# Patient Record
Sex: Male | Born: 1965
Health system: Southern US, Community
[De-identification: ages and names within clinical notes are randomized; demographics above are authoritative.]

## PROBLEM LIST (undated history)

## (undated) DIAGNOSIS — F32A Depression, unspecified: Secondary | ICD-10-CM

## (undated) DIAGNOSIS — T8859XA Other complications of anesthesia, initial encounter: Secondary | ICD-10-CM

## (undated) DIAGNOSIS — M869 Osteomyelitis, unspecified: Secondary | ICD-10-CM

## (undated) DIAGNOSIS — I10 Essential (primary) hypertension: Secondary | ICD-10-CM

## (undated) DIAGNOSIS — F4310 Post-traumatic stress disorder, unspecified: Secondary | ICD-10-CM

## (undated) DIAGNOSIS — I5032 Chronic diastolic (congestive) heart failure: Secondary | ICD-10-CM

## (undated) DIAGNOSIS — I519 Heart disease, unspecified: Secondary | ICD-10-CM

## (undated) DIAGNOSIS — I509 Heart failure, unspecified: Secondary | ICD-10-CM

## (undated) DIAGNOSIS — C801 Malignant (primary) neoplasm, unspecified: Secondary | ICD-10-CM

## (undated) DIAGNOSIS — E119 Type 2 diabetes mellitus without complications: Secondary | ICD-10-CM

## (undated) DIAGNOSIS — Z87442 Personal history of urinary calculi: Secondary | ICD-10-CM

## (undated) HISTORY — PX: BELOW KNEE LEG AMPUTATION: SUR23

## (undated) HISTORY — DX: Heart disease, unspecified: I51.9

---

## 2019-09-13 ENCOUNTER — Encounter (HOSPITAL_COMMUNITY): Payer: Self-pay

## 2019-09-13 ENCOUNTER — Ambulatory Visit (HOSPITAL_COMMUNITY)
Admission: EM | Admit: 2019-09-13 | Discharge: 2019-09-13 | Disposition: A | Discharge status: Home / Self Care | Payer: Medicare HMO | Attending: Urgent Care | Admitting: Urgent Care

## 2019-09-13 ENCOUNTER — Other Ambulatory Visit: Payer: Self-pay

## 2019-09-13 DIAGNOSIS — R3129 Other microscopic hematuria: Secondary | ICD-10-CM | POA: Diagnosis not present

## 2019-09-13 HISTORY — DX: Type 2 diabetes mellitus without complications: E11.9

## 2019-09-13 HISTORY — DX: Essential (primary) hypertension: I10

## 2019-09-13 LAB — COMPREHENSIVE METABOLIC PANEL
ALT: 54 U/L — ABNORMAL HIGH (ref 0–44)
AST: 37 U/L (ref 15–41)
Albumin: 3.4 g/dL — ABNORMAL LOW (ref 3.5–5.0)
Alkaline Phosphatase: 76 U/L (ref 38–126)
Anion gap: 11 (ref 5–15)
BUN: 21 mg/dL — ABNORMAL HIGH (ref 6–20)
CO2: 24 mmol/L (ref 22–32)
Calcium: 9 mg/dL (ref 8.9–10.3)
Chloride: 100 mmol/L (ref 98–111)
Creatinine, Ser: 1.57 mg/dL — ABNORMAL HIGH (ref 0.61–1.24)
GFR calc Af Amer: 57 mL/min — ABNORMAL LOW (ref >60)
GFR calc non Af Amer: 50 mL/min — ABNORMAL LOW (ref >60)
Glucose, Bld: 372 mg/dL — ABNORMAL HIGH (ref 70–99)
Potassium: 4.5 mmol/L (ref 3.5–5.1)
Sodium: 135 mmol/L (ref 135–145)
Total Bilirubin: 0.8 mg/dL (ref 0.3–1.2)
Total Protein: 6.6 g/dL (ref 6.5–8.1)

## 2019-09-13 LAB — CBC
HCT: 52.4 % — ABNORMAL HIGH (ref 39.0–52.0)
Hemoglobin: 17.8 g/dL — ABNORMAL HIGH (ref 13.0–17.0)
MCH: 29.2 pg (ref 26.0–34.0)
MCHC: 34 g/dL (ref 30.0–36.0)
MCV: 85.9 fL (ref 80.0–100.0)
Platelets: 244 10*3/uL (ref 150–400)
RBC: 6.1 MIL/uL — ABNORMAL HIGH (ref 4.22–5.81)
RDW: 13 % (ref 11.5–15.5)
WBC: 8.2 10*3/uL (ref 4.0–10.5)
nRBC: 0 % (ref 0.0–0.2)

## 2019-09-13 LAB — POCT URINALYSIS DIP (DEVICE)
Bilirubin Urine: NEGATIVE
Glucose, UA: 500 mg/dL — AB
Ketones, ur: NEGATIVE mg/dL
Leukocytes,Ua: NEGATIVE
Nitrite: NEGATIVE
Protein, ur: 100 mg/dL — AB
Specific Gravity, Urine: 1.025 (ref 1.005–1.030)
Urobilinogen, UA: 0.2 mg/dL (ref 0.0–1.0)
pH: 7 (ref 5.0–8.0)

## 2019-09-13 NOTE — ED Triage Notes (Signed)
Pt states he has kidney issues. Pt states he has blood in has urine. X 2 weeks or more. Pt states that doxy, cephalosporins, and penicillin Pre his PCP back home.

## 2019-09-13 NOTE — Discharge Instructions (Signed)
You have microscopic blood to urine today but no indication of infection at this time.  We will obtain some basic labs here today in anticipation of your establishment with a PCP as you will need further evaluation and management/ referral etc.  Continue to take your diabetes and blood pressure medications to control these chronic issues.

## 2019-09-13 NOTE — ED Provider Notes (Signed)
[place]    CSN: [id number] Arrival date & time: 09/13/19  0818      History   Chief Complaint Chief Complaint  Patient presents with  . Hematuria    HPI [name] is a 54 y.o. male.   [name] presents with complaints of chronic pelvic and back pain as well as hematuria. Moved here from [place] in November and is still working to establish care with a PCP. Had an MRI of kidneys completed in October of 2020 which presented with cystic lesions, with recommendations for recheck in 6 months. Symptoms have unchanged. He doesn't smoke. States his father had kidney cancer. No pain or frequency with urination. Has been treated with doxycycline in October for this, didn't help. No other penile complaints. He has type two diabetes, states his last a1c was 7.2 in October of 2020. He is taking metformin and humalog to control his diabetes. History of right lower leg amputation. States he has had pelvic and back pain for three years. No specific changes here today. Is working with his [employer] to set up appointment with PCP.     ROS per HPI, negative if not otherwise mentioned.      Past Medical History:  Diagnosis Date  . Diabetes mellitus without complication ([place])   . Hypertension     There are no problems to display for this patient.   History reviewed. No pertinent surgical history.     Home Medications    Prior to Admission medications   Not on File    Family History History reviewed. No pertinent family history.  Social History Social History   Tobacco Use  . Smoking status: Never Smoker  . Smokeless tobacco: Never Used  Substance Use Topics  . Alcohol use: Never  . Drug use: Never     Allergies   Patient has no known allergies.   Review of Systems Review of Systems   Physical Exam Triage Vital Signs ED Triage Vitals  Enc Vitals Group     BP 09/13/19 0901 (!) 174/91     Pulse Rate 09/13/19 0901 98     Resp  09/13/19 0901 18     Temp 09/13/19 0901 98.3 F (36.8 C)     Temp Source 09/13/19 0901 Oral     SpO2 09/13/19 0901 100 %     Weight 09/13/19 0902 230 lb (104.3 kg)     Height --      Head Circumference --      Peak Flow --      Pain Score 09/13/19 0902 7     Pain Loc --      Pain Edu? --      Excl. in [place]? --    No data found.  Updated Vital Signs BP (!) 174/91 (BP Location: Right Arm)   Pulse 98   Temp 98.3 F (36.8 C) (Oral)   Resp 18   Wt 230 lb (104.3 kg)   SpO2 100%   Visual Acuity Right Eye Distance:   Left Eye Distance:   Bilateral Distance:    Right Eye Near:   Left Eye Near:    Bilateral Near:     Physical Exam Constitutional:      Appearance: He is well-developed.  Cardiovascular:     Rate and Rhythm: Normal rate.  Pulmonary:     Effort: Pulmonary effort is normal.  Abdominal:     Comments: Left low back tenderness   Musculoskeletal:  Comments: Right lower leg prosthesis in place   Skin:    General: Skin is warm and dry.  Neurological:     Mental Status: He is alert and oriented to person, place, and time.      UC Treatments / Results  Labs (all labs ordered are listed, but only abnormal results are displayed) Labs Reviewed  POCT URINALYSIS DIP (DEVICE) - Abnormal; Notable for the following components:      Result Value   Glucose, UA 500 (*)    Hgb urine dipstick TRACE (*)    Protein, ur 100 (*)    All other components within normal limits  URINE CULTURE  CBC  COMPREHENSIVE METABOLIC PANEL    EKG   Radiology No results found.  Procedures Procedures (including critical care time)  Medications Ordered in UC Medications - No data to display  Initial Impression / Assessment and Plan / UC Course  I have reviewed the triage vital signs and the nursing notes.  Pertinent labs & imaging results that were available during my care of the patient were reviewed by me and considered in my medical decision making (see chart for  details).     Trace hgb, glucose, and protein to urine noted. No new complaints today. Baseline labs obtained as well today. Encouraged follow up with urology/ nephrology for further evaluation and management. Needs pcp for continued management of htn and dm. Return precautions provided. Patient verbalized understanding and agreeable to plan.   Final Clinical Impressions(s) / UC Diagnoses   Final diagnoses:  Other microscopic hematuria     Discharge Instructions     You have microscopic blood to urine today but no indication of infection at this time.  We will obtain some basic labs here today in anticipation of your establishment with a PCP as you will need further evaluation and management/ referral etc.  Continue to take your diabetes and blood pressure medications to control these chronic issues.    ED Prescriptions    None     PDMP not reviewed this encounter.   Zigmund Gottron, NP 09/13/19 [phone]

## 2019-09-15 ENCOUNTER — Telehealth: Payer: Self-pay | Admitting: Emergency Medicine

## 2019-09-15 NOTE — Telephone Encounter (Signed)
Patient contacted by phone and made aware of    results. Pt verbalized understanding and had all questions answered.  Will follow up with PCP as scheduled.

## 2019-09-16 ENCOUNTER — Telehealth (HOSPITAL_COMMUNITY): Payer: Self-pay | Admitting: Emergency Medicine

## 2019-09-16 LAB — URINE CULTURE: Culture: 60000 — AB

## 2019-09-16 MED ORDER — CEPHALEXIN 500 MG PO CAPS: 500.0000 mg | ORAL_CAPSULE | Freq: Four times a day (QID) | ORAL | 0 refills | Status: AC

## 2019-09-16 NOTE — Telephone Encounter (Signed)
Per natalie, treat urine culture.  cephalexin 500mg  po 4 times a day for 7 days  Patient contacted by phone and made aware of    results. Pt verbalized understanding and had all questions answered.

## 2019-09-18 ENCOUNTER — Other Ambulatory Visit: Payer: Self-pay

## 2019-09-19 ENCOUNTER — Ambulatory Visit: Payer: Medicare HMO | Admitting: Family Medicine

## 2019-09-25 ENCOUNTER — Encounter: Payer: Self-pay | Admitting: Family Medicine

## 2019-09-25 ENCOUNTER — Ambulatory Visit (INDEPENDENT_AMBULATORY_CARE_PROVIDER_SITE_OTHER): Payer: Medicare HMO | Admitting: Family Medicine

## 2019-09-25 ENCOUNTER — Other Ambulatory Visit: Payer: Self-pay

## 2019-09-25 VITALS — BP 156/92 | HR 92 | Temp 97.1°F | Ht 72.0 in | Wt 230.0 lb

## 2019-09-25 DIAGNOSIS — N281 Cyst of kidney, acquired: Secondary | ICD-10-CM | POA: Diagnosis not present

## 2019-09-25 DIAGNOSIS — Z449 Encounter for fitting and adjustment of unspecified external prosthetic device: Secondary | ICD-10-CM

## 2019-09-25 DIAGNOSIS — Z794 Long term (current) use of insulin: Secondary | ICD-10-CM

## 2019-09-25 DIAGNOSIS — E1165 Type 2 diabetes mellitus with hyperglycemia: Secondary | ICD-10-CM | POA: Diagnosis not present

## 2019-09-25 DIAGNOSIS — N1831 Chronic kidney disease, stage 3a: Secondary | ICD-10-CM | POA: Diagnosis not present

## 2019-09-25 DIAGNOSIS — R3911 Hesitancy of micturition: Secondary | ICD-10-CM

## 2019-09-25 DIAGNOSIS — F418 Other specified anxiety disorders: Secondary | ICD-10-CM

## 2019-09-25 DIAGNOSIS — Z0000 Encounter for general adult medical examination without abnormal findings: Secondary | ICD-10-CM

## 2019-09-25 DIAGNOSIS — R918 Other nonspecific abnormal finding of lung field: Secondary | ICD-10-CM | POA: Insufficient documentation

## 2019-09-25 DIAGNOSIS — R319 Hematuria, unspecified: Secondary | ICD-10-CM

## 2019-09-25 DIAGNOSIS — I10 Essential (primary) hypertension: Secondary | ICD-10-CM

## 2019-09-25 DIAGNOSIS — Z515 Encounter for palliative care: Secondary | ICD-10-CM | POA: Insufficient documentation

## 2019-09-25 DIAGNOSIS — N529 Male erectile dysfunction, unspecified: Secondary | ICD-10-CM | POA: Diagnosis not present

## 2019-09-25 DIAGNOSIS — Z23 Encounter for immunization: Secondary | ICD-10-CM | POA: Diagnosis not present

## 2019-09-25 DIAGNOSIS — R911 Solitary pulmonary nodule: Secondary | ICD-10-CM

## 2019-09-25 DIAGNOSIS — F32A Depression, unspecified: Secondary | ICD-10-CM | POA: Insufficient documentation

## 2019-09-25 HISTORY — DX: Chronic kidney disease, stage 3a: N18.31

## 2019-09-25 HISTORY — DX: Cyst of kidney, acquired: N28.1

## 2019-09-25 HISTORY — DX: Hematuria, unspecified: R31.9

## 2019-09-25 HISTORY — DX: Encounter for general adult medical examination without abnormal findings: Z00.00

## 2019-09-25 MED ORDER — SILDENAFIL CITRATE 50 MG PO TABS: 50.0000 mg | ORAL_TABLET | Freq: Every day | ORAL | 0 refills | Status: DC | PRN

## 2019-09-25 NOTE — Progress Notes (Addendum)
New Patient Office Visit  Subjective:  [name], male    DOB: 1966/02/23  Age: 54 y.o. MRN: [id number]  CC:  Chief Complaint  Patient presents with  . Follow-up    hospital follow-up on blood work and kidney, blood in urine     HPI Ethanmichael [name] presents for establishment of care.  Recently moved into the area from [place] to be with his son daughter-in-law and new grandchild.  He suffers from multiple chronic medical issues.  History of hypertension, type 2 diabetes currently or controlled using preprandial Humalog and Glucophage 1000 mg twice daily.  Recent onset of hematuria.  History of complex renal cysts, urinary hesitancy, and UTI.  History of stage IIIb chronic kidney disease.  History of anxiety with depression.  Depression is currently worse.  He denies suicidal plan or ideation but says that he has thought that things would be better off if he were not here.  History of mild splenomegaly and pulmonary nodules noted incidentally on multiple scans.  He tells me that he has never smoked.  He has a right BKA prosthesis.  It was not clear to me from his report what happened to his leg.  He needs his prosthesis to be adjusted.  Past Medical History:  Diagnosis Date  . Diabetes mellitus without complication ([place])   . Hypertension     History reviewed. No pertinent surgical history.  Family History  Problem Relation Age of Onset  . Anxiety disorder Mother   . Cancer Father     Social History   Socioeconomic History  . Marital status: Divorced    Spouse name: Not on file  . Number of children: Not on file  . Years of education: Not on file  . Highest education level: Not on file  Occupational History  . Not on file  Tobacco Use  . Smoking status: Never Smoker  . Smokeless tobacco: Never Used  Substance and Sexual Activity  . Alcohol use: Never  . Drug use: Never  . Sexual activity: Yes  Other Topics Concern  . Not on file  Social History  Narrative  . Not on file   Social Determinants of Health   Financial Resource Strain:   . Difficulty of Paying Living Expenses: Not on file  Food Insecurity:   . Worried About Charity fundraiser in the Last Year: Not on file  . Ran Out of Food in the Last Year: Not on file  Transportation Needs:   . Lack of Transportation (Medical): Not on file  . Lack of Transportation (Non-Medical): Not on file  Physical Activity:   . Days of Exercise per Week: Not on file  . Minutes of Exercise per Session: Not on file  Stress:   . Feeling of Stress : Not on file  Social Connections:   . Frequency of Communication with Friends and Family: Not on file  . Frequency of Social Gatherings with Friends and Family: Not on file  . Attends Religious Services: Not on file  . Active Member of Clubs or Organizations: Not on file  . Attends Archivist Meetings: Not on file  . Marital Status: Not on file  Intimate Partner Violence:   . Fear of Current or Ex-Partner: Not on file  . Emotionally Abused: Not on file  . Physically Abused: Not on file  . Sexually Abused: Not on file    ROS Review of Systems  Constitutional: Negative for chills, diaphoresis, fatigue, fever  and unexpected weight change.  HENT: Negative.   Eyes: Negative for photophobia and visual disturbance.  Respiratory: Negative.   Cardiovascular: Negative.   Gastrointestinal: Negative.   Endocrine: Negative for polyphagia and polyuria.  Genitourinary: Positive for difficulty urinating, hematuria and urgency. Negative for decreased urine volume, discharge and dysuria.  Musculoskeletal: Positive for gait problem.  Neurological: Negative for weakness.  Hematological: Does not bruise/bleed easily.  Psychiatric/Behavioral: Positive for dysphoric mood. The patient is nervous/anxious.    Depression screen [facility] 2/9 09/25/2019 09/25/2019  Decreased Interest 3 0  Down, Depressed, Hopeless 3 3  PHQ - 2 Score 6 3  Altered sleeping 3 -   Tired, decreased energy 3 -  Change in appetite 2 -  Feeling bad or failure about yourself  2 -  Trouble concentrating 2 -  Moving slowly or fidgety/restless 2 -  Suicidal thoughts 2 -  PHQ-9 Score 22 -    Objective:   Today's Vitals: BP (!) 156/92   Pulse 92   Temp (!) 97.1 F (36.2 C) (Tympanic)   Ht 6' (1.829 m)   Wt 230 lb (104.3 kg)   SpO2 98%   BMI 31.19 kg/m   Physical Exam Vitals and nursing note reviewed.  Constitutional:      General: He is not in acute distress.    Appearance: Normal appearance. He is normal weight. He is not ill-appearing, toxic-appearing or diaphoretic.  HENT:     Head: Normocephalic and atraumatic.     Right Ear: External ear normal.     Left Ear: External ear normal.  Eyes:     General: No scleral icterus.       Right eye: No discharge.        Left eye: No discharge.  Pulmonary:     Effort: Pulmonary effort is normal.  Neurological:     Mental Status: He is alert and oriented to person, place, and time.  Psychiatric:        Mood and Affect: Mood normal.        Behavior: Behavior normal.     Assessment & Plan:   Problem List Items Addressed This Visit      Cardiovascular and Mediastinum   Essential hypertension - Primary   Relevant Medications   sildenafil (VIAGRA) 50 MG tablet   lisinopril-hydrochlorothiazide (ZESTORETIC) 20-25 MG tablet   atorvastatin (LIPITOR) 20 MG tablet   Other Relevant Orders   CBC   Basic metabolic panel   Hepatic function panel   Urinalysis, Routine w reflex microscopic     Endocrine   Type 2 diabetes mellitus with hyperglycemia, with long-term current use of insulin (HCC)   Relevant Medications   lisinopril-hydrochlorothiazide (ZESTORETIC) 20-25 MG tablet   atorvastatin (LIPITOR) 20 MG tablet   metFORMIN (GLUCOPHAGE) 1000 MG tablet   Other Relevant Orders   CBC   Basic metabolic panel   LDL cholesterol, direct   Hemoglobin A1c   Urinalysis, Routine w reflex microscopic   Ambulatory  referral to Endocrinology   Ambulatory referral to Ophthalmology     Genitourinary   Stage 3a chronic kidney disease   Relevant Orders   Basic metabolic panel   Urinalysis, Routine w reflex microscopic   Acquired complex renal cyst   Relevant Orders   Basic metabolic panel   Urinalysis, Routine w reflex microscopic   Ambulatory referral to Urology     Other   Hematuria   Relevant Orders   Basic metabolic panel   Urinalysis, Routine w reflex microscopic  Urine Culture   Ambulatory referral to Urology   Urinary hesitancy   Relevant Orders   PSA   Urinalysis, Routine w reflex microscopic   Urine Culture   Ambulatory referral to Urology   Prosthesis adjustments   Relevant Orders   Ambulatory referral to Orthopedic Surgery   Need for immunization against influenza   Relevant Orders   Flu Vaccine QUAD 6+ mos PF IM (Fluarix Quad PF) (Completed)   Need for Tdap vaccination   Relevant Orders   Tdap vaccine greater than or equal to 7yo IM (Completed)   Erectile dysfunction   Relevant Medications   sildenafil (VIAGRA) 50 MG tablet   Healthcare maintenance   Pulmonary nodule   Depression with anxiety   Relevant Medications   citalopram (CELEXA) 40 MG tablet   Other Relevant Orders   Ambulatory referral to Psychology      Outpatient Encounter Medications as of 09/25/2019  Medication Sig  . atorvastatin (LIPITOR) 20 MG tablet Take 20 mg by mouth daily.  . citalopram (CELEXA) 40 MG tablet Take 40 mg by mouth daily.  Marland Kitchen lisinopril-hydrochlorothiazide (ZESTORETIC) 20-25 MG tablet Take 1 tablet by mouth daily.  . metFORMIN (GLUCOPHAGE) 1000 MG tablet Take 1,000 mg by mouth 2 (two) times daily with a meal.  . sildenafil (VIAGRA) 50 MG tablet Take 1 tablet (50 mg total) by mouth daily as needed for erectile dysfunction.   No facility-administered encounter medications on file as of 09/25/2019.    Follow-up: Return in about 1 month (around 10/26/2019).   Patient will continue  his current medications.  Attempted to address some of his more pressing needs.  Follow-up in 1 month.  Patient requested a refill on his Xanax.  I told him that citalopram is a much better choice for his constellation of symptoms.  I spent over 45 minutes with this patient with greater than 50% of the time spent in counseling.  Libby Maw, MD   10/30/19 addendum: Patient called requesting a 90-day supply of Viagra.  He was told that there is no such thing is a 90-day supply of a as needed medicine.  I advised him to use the good Rx card for a prescription of 50 pills.  This did not satisfy him.  He became belligerent and cursed at my CMA, [place].  He said that he was going to find another provider.  Patient dropped the F bomb multiple times and stated several times "Fhim".

## 2019-10-09 ENCOUNTER — Other Ambulatory Visit: Payer: Self-pay

## 2019-10-10 DIAGNOSIS — R350 Frequency of micturition: Secondary | ICD-10-CM | POA: Diagnosis not present

## 2019-10-10 DIAGNOSIS — N281 Cyst of kidney, acquired: Secondary | ICD-10-CM | POA: Diagnosis not present

## 2019-10-10 DIAGNOSIS — R351 Nocturia: Secondary | ICD-10-CM | POA: Diagnosis not present

## 2019-10-11 ENCOUNTER — Telehealth: Payer: Self-pay | Admitting: Family Medicine

## 2019-10-11 ENCOUNTER — Other Ambulatory Visit: Payer: Self-pay

## 2019-10-11 ENCOUNTER — Encounter: Payer: Self-pay | Admitting: Internal Medicine

## 2019-10-11 ENCOUNTER — Ambulatory Visit (INDEPENDENT_AMBULATORY_CARE_PROVIDER_SITE_OTHER): Payer: Medicare HMO | Admitting: Internal Medicine

## 2019-10-11 VITALS — BP 162/84 | HR 79 | Temp 98.7°F | Ht 72.0 in | Wt 229.0 lb

## 2019-10-11 DIAGNOSIS — E1142 Type 2 diabetes mellitus with diabetic polyneuropathy: Secondary | ICD-10-CM | POA: Diagnosis not present

## 2019-10-11 DIAGNOSIS — E1165 Type 2 diabetes mellitus with hyperglycemia: Secondary | ICD-10-CM | POA: Diagnosis not present

## 2019-10-11 DIAGNOSIS — Z794 Long term (current) use of insulin: Secondary | ICD-10-CM

## 2019-10-11 DIAGNOSIS — E1121 Type 2 diabetes mellitus with diabetic nephropathy: Secondary | ICD-10-CM | POA: Diagnosis not present

## 2019-10-11 DIAGNOSIS — N1831 Chronic kidney disease, stage 3a: Secondary | ICD-10-CM | POA: Diagnosis not present

## 2019-10-11 DIAGNOSIS — Z89511 Acquired absence of right leg below knee: Secondary | ICD-10-CM

## 2019-10-11 DIAGNOSIS — E785 Hyperlipidemia, unspecified: Secondary | ICD-10-CM | POA: Diagnosis not present

## 2019-10-11 DIAGNOSIS — E1122 Type 2 diabetes mellitus with diabetic chronic kidney disease: Secondary | ICD-10-CM

## 2019-10-11 LAB — POCT GLYCOSYLATED HEMOGLOBIN (HGB A1C): Hemoglobin A1C: 8.6 % — AB (ref 4.0–5.6)

## 2019-10-11 LAB — GLUCOSE, POCT (MANUAL RESULT ENTRY): POC Glucose: 223 mg/dl — AB (ref 70–99)

## 2019-10-11 NOTE — Patient Instructions (Signed)
-   ReliOn- N (NPH) 15 units at bedtime  - ReliOn- R  (regular) to be taken 30 minutes before a meal ( one unit for every 4 units ) - do not stack you meals please, space them by 4 hours  - Bring meter on next visit    - HOW TO TREAT LOW BLOOD SUGARS (Blood sugar LESS THAN 70 MG/DL)  Please follow the RULE OF 15 for the treatment of hypoglycemia treatment (when your (blood sugars are less than 70 mg/dL)    STEP 1: Take 15 grams of carbohydrates when your blood sugar is low, which includes:   3-4 GLUCOSE TABS  OR  3-4 OZ OF JUICE OR REGULAR SODA OR  ONE TUBE OF GLUCOSE GEL     STEP 2: RECHECK blood sugar in 15 MINUTES STEP 3: If your blood sugar is still low at the 15 minute recheck --> then, go back to STEP 1 and treat AGAIN with another 15 grams of carbohydrates.

## 2019-10-11 NOTE — Telephone Encounter (Signed)
Pt called and said that he had discussed with Dr Ethelene Hal on his last appointment about getting a referral regarding his lungs, has this been ordered?

## 2019-10-11 NOTE — Telephone Encounter (Signed)
Spoke with patient who states that he was supposed to have a referral to Pulmonology. I did not see a referral in patients chart. Okay to refer? Please advise

## 2019-10-11 NOTE — Progress Notes (Signed)
Name: [name]  MRN/ DOB: [id number], 1966-05-20   Age/ Sex: 54 y.o., male    PCP: Libby Maw, MD   Reason for Endocrinology Evaluation: Type 2 Diabetes Mellitus     Date of Initial Endocrinology Visit: 10/12/2019     PATIENT IDENTIFIER: Mr. [name] is a 54 y.o. male with a past medical history of T2Dm, HTN and Dyslipidemia. The patient presented for initial endocrinology clinic visit on 10/12/2019 for consultative assistance with his diabetes management.    HPI: Mr. [name] moved from [place]    Diagnosed with DM 2006 Prior Medications tried/Intolerance: lantus, basaglar - diarrhea . Januvia - did not need it  Currently checking blood sugars 3-4 x / day,  before and after meals  Hypoglycemia episodes :rare              Symptoms: shaky                Hemoglobin A1c has ranged from 7.2% in 06/2019, peaking at 11.0%  Many years ago  Patient required assistance for hypoglycemia: no  Patient has required hospitalization within the last 1 year from hyper or hypoglycemia: no   In terms of diet, the patient eats 4-5 meals a day    HOME DIABETES REGIMEN: Metformin 1000 mg BID  Humalog  I: C 1:3     Statin: yes ACE-I/ARB: yes Prior Diabetic Education: yes   METER DOWNLOAD SUMMARY: Did not bring   DIABETIC COMPLICATIONS: Microvascular complications:   Neuropathy ( S/P Right BKA) , CKD   Denies: retinopathy  Last eye exam: Completed 10/18/2019  Macrovascular complications:    Denies: CAD, PVD, CVA   PAST HISTORY: Past Medical History:  Past Medical History:  Diagnosis Date  . Diabetes mellitus without complication ([place])   . Hypertension    Past Surgical History:  Past Surgical History:  Procedure Laterality Date  . BELOW KNEE LEG AMPUTATION Right       Social History:  reports that he has never smoked. He has never used smokeless tobacco. He reports that he does not drink alcohol or use drugs. Family History:  Family History   Problem Relation Age of Onset  . Anxiety disorder Mother   . Cancer Father      HOME MEDICATIONS: Allergies as of 10/11/2019      Reactions   Bactrim [sulfamethoxazole-trimethoprim]    Ceprotin [protein C Concentrate (human)]    Ciprofloxacin Other (See Comments)   Kidney function   Levaquin [levofloxacin]       Medication List       Accurate as of October 11, 2019 11:59 PM. If you have any questions, ask your nurse or doctor.        atorvastatin 20 MG tablet Commonly known as: LIPITOR Take 20 mg by mouth daily.   calcium-vitamin D 500-200 MG-UNIT tablet Commonly known as: OSCAL WITH D Take 1 tablet by mouth.   citalopram 40 MG tablet Commonly known as: CELEXA Take 40 mg by mouth daily.   HumaLOG 100 UNIT/ML injection Generic drug: insulin lispro Inject into the skin 3 (three) times daily before meals. 10-20 units sliding scale   lisinopril-hydrochlorothiazide 20-25 MG tablet Commonly known as: ZESTORETIC Take 1 tablet by mouth daily.   metFORMIN 1000 MG tablet Commonly known as: GLUCOPHAGE Take 1,000 mg by mouth 2 (two) times daily with a meal.   sildenafil 50 MG tablet Commonly known as: Viagra Take 1 tablet (50 mg total) by mouth daily as needed for  erectile dysfunction.        ALLERGIES: Allergies  Allergen Reactions  . Bactrim [Sulfamethoxazole-Trimethoprim]   . Ceprotin [Protein C Concentrate (Human)]   . Ciprofloxacin Other (See Comments)    Kidney function  . Levaquin [Levofloxacin]      REVIEW OF SYSTEMS: A comprehensive ROS was conducted with the patient and is negative except as per HPI and below:  Review of Systems  Gastrointestinal: Negative for diarrhea and nausea.  Genitourinary: Positive for frequency.  Endo/Heme/Allergies: Negative for polydipsia.  Psychiatric/Behavioral: Positive for depression.      OBJECTIVE:   VITAL SIGNS: BP (!) 162/84 (BP Location: Right Arm, Patient Position: Sitting, Cuff Size: Large)   Pulse 79    Temp 98.7 F (37.1 C)   Ht 6' (1.829 m)   Wt 229 lb (103.9 kg)   SpO2 97%   BMI 31.06 kg/m    PHYSICAL EXAM:  General: Pt appears well and is in NAD  HEENT:  Eyes: External eye exam normal without stare, lid lag or exophthalmos.  EOM intact.   Neck: General: Supple without adenopathy or carotid bruits. Thyroid: Thyroid size normal.  No goiter or nodules appreciated. No thyroid bruit.  Lungs: Clear with good BS bilat with no rales, rhonchi, or wheezes  Heart: RRR with normal S1 and S2 and no gallops; no murmurs; no rub  Abdomen: Normoactive bowel sounds, soft, nontender, without masses or organomegaly palpable  Extremities:  Lower extremities - No pretibial edema. No lesions.  Skin: Normal texture and temperature to palpation. No rash noted. No Acanthosis nigricans/skin tags. No lipohypertrophy.  Neuro: MS is good with appropriate affect, pt is alert and Ox3    DM foot exam: deferred   DATA REVIEWED:  Lab Results  Component Value Date   HGBA1C 8.6 (A) 10/11/2019   Lab Results  Component Value Date   CREATININE 1.57 (H) 09/13/2019   ASSESSMENT / PLAN / RECOMMENDATIONS:   1) Type 2 Diabetes Mellitus, poorly controlled, With neuropathy, and CKD III complications and right below-knee amputation- Most recent A1c of 8.6 %. Goal A1c < 7.0 %.   -The patient is under the impression that Metformin does not work, I have explained to him that Metformin improves his insulin resistance and without it, he would require at least 20% more insulin than his current regimen, to keep glucose readings at the current level. -I have also explained to him that patients typically require a longer acting insulin in addition to a short acting insulin, patient has social limitations due to the high cost of insulin and limited income, he would like to use the [place] brand moving forward. -He is currently using Humalog vials that was provided by his prior physician in [place] -I also have advised him  against eating 4-5 meals a day if possible as this will result in stacking of his insulin followed by the risk of hypoglycemia. -We did discuss NPH insulin and the importance of taking it at bedtime, if taken earlier he is at risk for developing hypoglycemia overnight. -We also discussed regular insulin, and the importance of taking this 20 to 30 minutes before a meal we also discussed the it has a longer half-life than Humalog and the risk of hypoglycemia is higher with stacking his meals close to each other, I have recommended that he spaces his meals out by at least 4 hours. -Patient encouraged to reach out to [place] with hypoglycemia or BG is below 80 mg/dL -I also explained to him that  the following insulin doses are my best educated guess given the lack of glucose data on today's visit, patient instructed to bring his meter in the future as this data is very valuable to me in making the appropriate management decisions -I will also adjust his insulin to carb ratio from 1:3 to 1:4 , I suspect this is a bit tight , assuming that he is doing the right calculation , but will have to monitor his glucose and proceed from there   MEDICATIONS:  Stop Humalog  Start ReliOn- N (NPH) 15 units at bedtime   ReliOn- R  (regular) to be taken 30 minutes before a meal ( one unit for every 4 units )   EDUCATION / INSTRUCTIONS:  BG monitoring instructions: Patient is instructed to check his blood sugars 4 times a day, before meals.  Call [place] [facility] if: BG persistently < 70 or > 300. . I reviewed the Rule of 15 for the treatment of hypoglycemia in detail with the patient. Literature supplied.   2) Diabetic complications:   Eye: Does not have known diabetic retinopathy.   Neuro/ Feet: Does  have known diabetic peripheral neuropathy.  Renal: Patient does have known baseline CKD. He is on an ACEI/ARB at present.  3) Lipids: Patient is on lipitor 20 mg daily    4) Hypertension: He is  above goal of < 140/90 mmHg. Will defer to PCP.   F/U in 3 months      Signed electronically by: Mack Guise, MD  [facility]  [facility] [address] [place], [place] [address] Phone: [phone] FAX: [phone]   CC: Libby Maw, MD [place] [address] Phone: [phone]  Fax: [phone]    Return to Endocrinology clinic as below: Future Appointments  Date Time Provider [place]  10/12/2019  1:15 PM [name], MD [facility] None  10/24/2019 10:00 AM [facility]  10/26/2019  3:00 PM Libby Maw, MD [facility]  01/09/2020  2:00 PM Tarvares Lant, Melanie Crazier, MD [facility] None

## 2019-10-12 ENCOUNTER — Encounter: Payer: Self-pay | Admitting: Internal Medicine

## 2019-10-12 ENCOUNTER — Ambulatory Visit (INDEPENDENT_AMBULATORY_CARE_PROVIDER_SITE_OTHER): Payer: Medicare HMO | Admitting: Orthopedic Surgery

## 2019-10-12 ENCOUNTER — Encounter: Payer: Self-pay | Admitting: Orthopedic Surgery

## 2019-10-12 VITALS — Ht 72.0 in | Wt 229.0 lb

## 2019-10-12 DIAGNOSIS — Z89211 Acquired absence of right upper limb below elbow: Secondary | ICD-10-CM | POA: Insufficient documentation

## 2019-10-12 DIAGNOSIS — E1042 Type 1 diabetes mellitus with diabetic polyneuropathy: Secondary | ICD-10-CM | POA: Insufficient documentation

## 2019-10-12 DIAGNOSIS — E1122 Type 2 diabetes mellitus with diabetic chronic kidney disease: Secondary | ICD-10-CM

## 2019-10-12 DIAGNOSIS — Z449 Encounter for fitting and adjustment of unspecified external prosthetic device: Secondary | ICD-10-CM

## 2019-10-12 DIAGNOSIS — E1142 Type 2 diabetes mellitus with diabetic polyneuropathy: Secondary | ICD-10-CM | POA: Insufficient documentation

## 2019-10-12 DIAGNOSIS — N1831 Chronic kidney disease, stage 3a: Secondary | ICD-10-CM | POA: Insufficient documentation

## 2019-10-12 DIAGNOSIS — Z794 Long term (current) use of insulin: Secondary | ICD-10-CM | POA: Insufficient documentation

## 2019-10-12 HISTORY — DX: Type 2 diabetes mellitus with diabetic chronic kidney disease: E11.22

## 2019-10-12 MED ORDER — INSULIN NPH (HUMAN) (ISOPHANE) 100 UNIT/ML ~~LOC~~ SUSP: 15.0000 [IU] | Freq: Every day | SUBCUTANEOUS | 11 refills | Status: DC

## 2019-10-12 MED ORDER — INSULIN REGULAR HUMAN 100 UNIT/ML IJ SOLN: INTRAMUSCULAR | 11 refills | Status: DC

## 2019-10-12 NOTE — Progress Notes (Signed)
Office Visit Note   Patient: [name]           Date of Birth: [birth year]           MRN: [id number] Visit Date: 10/12/2019              Requested by: Libby Maw, [address] [place],  [place] [address] PCP: Libby Maw, MD  Chief Complaint  Patient presents with  . Right Leg - Pain    Prosthetic evaluation ill fitting limb      HPI: This is a pleasant 54 year old gentleman who is 4 years status post right below-knee amputation.  He is a type II diabetic.  He had 5 surgeries on this foot and then an amputation done elsewhere.  He recently moved to the area and is need of new prosthetic supplies.  His prosthetic is fairly new and in good condition but he is in need of liners and would just like to have his prosthetic evaluated to be sure it is in good condition  Assessment & Plan: Visit Diagnoses: No diagnosis found.  Plan: I have given him a prescription to [facility].  He will follow-up as needed.  Follow-Up Instructions: No follow-ups on file.   Ortho Exam  Patient is alert, oriented, no adenopathy, well-dressed, normal affect, normal respiratory effort. Status post right below-knee amputation.  Full knee range of motion completely healed surgical incision.  His prosthetic is in good condition without any breakdown however his liner seem to be torn in places.  Imaging: No results found. No images are attached to the encounter.  Labs: Lab Results  Component Value Date   HGBA1C 8.6 (A) 10/11/2019   REPTSTATUS 09/16/2019 FINAL 09/13/2019   CULT 60,000 COLONIES/mL VIRIDANS STREPTOCOCCUS (A) 09/13/2019     Lab Results  Component Value Date   ALBUMIN 3.4 (L) 09/13/2019    No results found for: MG No results found for: VD25OH  No results found for: PREALBUMIN CBC EXTENDED Latest Ref Rng & Units 09/13/2019  WBC 4.0 - 10.5 K/uL 8.2  RBC 4.22 - 5.81 MIL/uL 6.10(H)  HGB 13.0 - 17.0 g/dL 17.8(H)  HCT 39.0 - 52.0 % 52.4(H)  PLT 150  - 400 K/uL 244     Body mass index is 31.06 kg/m.  Orders:  No orders of the defined types were placed in this encounter.  No orders of the defined types were placed in this encounter.    Procedures: No procedures performed  Clinical Data: No additional findings.  ROS:  All other systems negative, except as noted in the HPI. Review of Systems  Objective: Vital Signs: Ht 6' (1.829 m)   Wt 229 lb (103.9 kg)   BMI 31.06 kg/m   Specialty Comments:  No specialty comments available.  PMFS History: Patient Active Problem List   Diagnosis Date Noted  . Type 2 diabetes mellitus with diabetic polyneuropathy, with long-term current use of insulin ([place]) 10/12/2019  . Type 2 diabetes mellitus with stage 3a chronic kidney disease, without long-term current use of insulin ([place]) 10/12/2019  . Right below-elbow amputee 10/12/2019  . Essential hypertension 09/25/2019  . Type 2 diabetes mellitus with hyperglycemia, with long-term current use of insulin ([place]) 09/25/2019  . Stage 3a chronic kidney disease 09/25/2019  . Hematuria 09/25/2019  . Acquired complex renal cyst 09/25/2019  . Urinary hesitancy 09/25/2019  . Prosthesis adjustments 09/25/2019  . Need for immunization against influenza 09/25/2019  . Need for Tdap vaccination 09/25/2019  .  Erectile dysfunction 09/25/2019  . Healthcare maintenance 09/25/2019  . Pulmonary nodule 09/25/2019  . Depression with anxiety 09/25/2019   Past Medical History:  Diagnosis Date  . Diabetes mellitus without complication ([place])   . Hypertension     Family History  Problem Relation Age of Onset  . Anxiety disorder Mother   . Cancer Father     Past Surgical History:  Procedure Laterality Date  . BELOW KNEE LEG AMPUTATION Right    Social History   Occupational History  . Not on file  Tobacco Use  . Smoking status: Never Smoker  . Smokeless tobacco: Never Used  Substance and Sexual Activity  . Alcohol use: Never  . Drug use:  Never  . Sexual activity: Yes

## 2019-10-13 NOTE — Telephone Encounter (Signed)
Ordered

## 2019-10-13 NOTE — Telephone Encounter (Signed)
Pt aware referral sent

## 2019-10-13 NOTE — Addendum Note (Signed)
Addended by: Jon Billings on: 10/13/2019 12:08 PM   Modules accepted: Orders

## 2019-10-18 DIAGNOSIS — H5213 Myopia, bilateral: Secondary | ICD-10-CM | POA: Diagnosis not present

## 2019-10-23 ENCOUNTER — Other Ambulatory Visit: Payer: Self-pay

## 2019-10-24 ENCOUNTER — Other Ambulatory Visit: Payer: Medicare HMO

## 2019-10-24 DIAGNOSIS — N281 Cyst of kidney, acquired: Secondary | ICD-10-CM | POA: Diagnosis not present

## 2019-10-25 ENCOUNTER — Other Ambulatory Visit: Payer: Self-pay

## 2019-10-25 ENCOUNTER — Telehealth: Payer: Self-pay | Admitting: Family Medicine

## 2019-10-25 NOTE — Telephone Encounter (Signed)
Patient is calling and is asking if he can go to [facility] on [address] to get labs. Pls advise. CB [phone]

## 2019-10-26 ENCOUNTER — Ambulatory Visit: Payer: Medicare HMO | Admitting: Family Medicine

## 2019-10-27 ENCOUNTER — Telehealth: Payer: Self-pay | Admitting: Family Medicine

## 2019-10-27 NOTE — Telephone Encounter (Signed)
Patient is calling and stated that a faxed was sent over for his prescription for Viagra that needs to be completed and faxed back. CB is [phone]

## 2019-10-30 ENCOUNTER — Other Ambulatory Visit: Payer: Self-pay | Admitting: Urology

## 2019-10-30 DIAGNOSIS — Z89512 Acquired absence of left leg below knee: Secondary | ICD-10-CM | POA: Insufficient documentation

## 2019-10-30 DIAGNOSIS — E785 Hyperlipidemia, unspecified: Secondary | ICD-10-CM | POA: Insufficient documentation

## 2019-10-30 DIAGNOSIS — M545 Low back pain, unspecified: Secondary | ICD-10-CM

## 2019-10-30 DIAGNOSIS — N5201 Erectile dysfunction due to arterial insufficiency: Secondary | ICD-10-CM | POA: Diagnosis not present

## 2019-10-30 DIAGNOSIS — N281 Cyst of kidney, acquired: Secondary | ICD-10-CM | POA: Diagnosis not present

## 2019-10-30 DIAGNOSIS — Z89511 Acquired absence of right leg below knee: Secondary | ICD-10-CM | POA: Insufficient documentation

## 2019-10-30 NOTE — Telephone Encounter (Signed)
Pt called back and said that he is upset that this hasnt been done yet because it has been 4 days, I let him know that we were not open Thursday and Friday not a full day and we are not here the weekend. I let him know I would send it to his provider and have it addressed.

## 2019-10-30 NOTE — Telephone Encounter (Signed)
Patient not happy with the 50 pills states that his insurance told him 90 day supply. Per pt he will not be coming back he will find a new Provider.

## 2019-10-30 NOTE — Telephone Encounter (Signed)
We send in #50 pills that will cost him about $25 with [facility]. There is no such thing as a 90 day supply for a prn med.

## 2019-10-31 NOTE — Telephone Encounter (Signed)
Spoke with patient.

## 2019-11-02 ENCOUNTER — Encounter: Payer: Self-pay | Admitting: Family Medicine

## 2019-11-02 ENCOUNTER — Ambulatory Visit
Admission: RE | Admit: 2019-11-02 | Discharge: 2019-11-02 | Disposition: A | Discharge status: Home / Self Care | Payer: Medicare HMO | Source: Ambulatory Visit | Attending: Urology | Admitting: Urology

## 2019-11-02 DIAGNOSIS — M545 Low back pain, unspecified: Secondary | ICD-10-CM

## 2019-11-02 DIAGNOSIS — M5126 Other intervertebral disc displacement, lumbar region: Secondary | ICD-10-CM | POA: Diagnosis not present

## 2019-11-02 IMAGING — MR MR LUMBAR SPINE WO/W CM
4 of 7 series · 25 of 48 positions shown · IV contrast (20ml Multihance)
Comparison: None.

CLINICAL DATA: Low back pain extending into the left hip.
Progressive disease over the last 2 years.

EXAM:
MRI LUMBAR SPINE WITHOUT AND WITH CONTRAST
TECHNIQUE: Multiplanar and multiecho pulse sequences of the lumbar spine were
obtained without and with intravenous contrast.
CONTRAST:  20mL MULTIHANCE GADOBENATE DIMEGLUMINE 529 MG/ML IV SOLN

[Series 5: T1 · sagittal · 4.0mm · 0.57mm/px · 4 of 15 slices shown (1 of 2)]
[im 1/15]
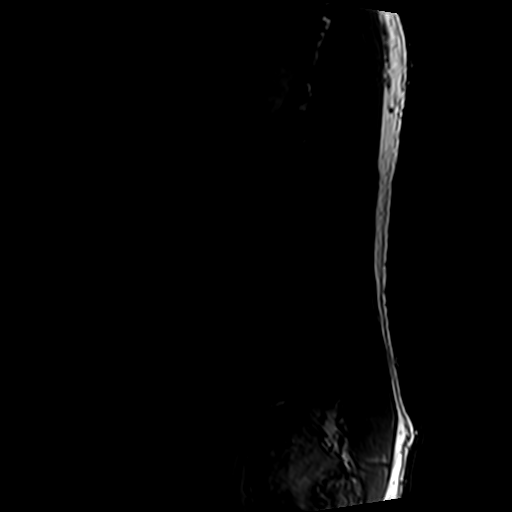
[im 5/15]
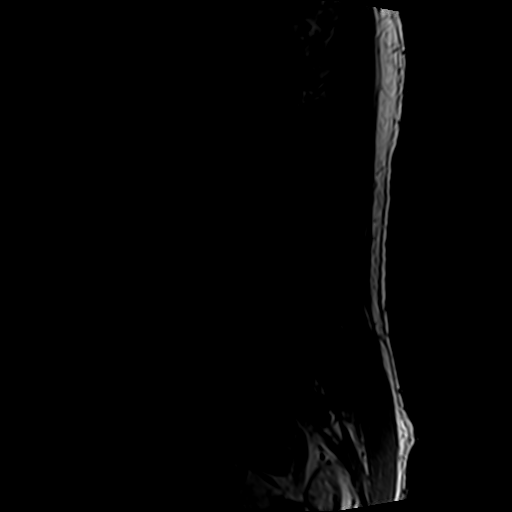
[im 10/15]
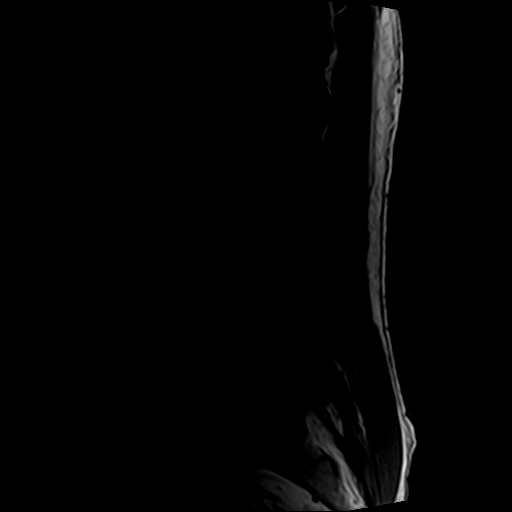
[im 15/15]
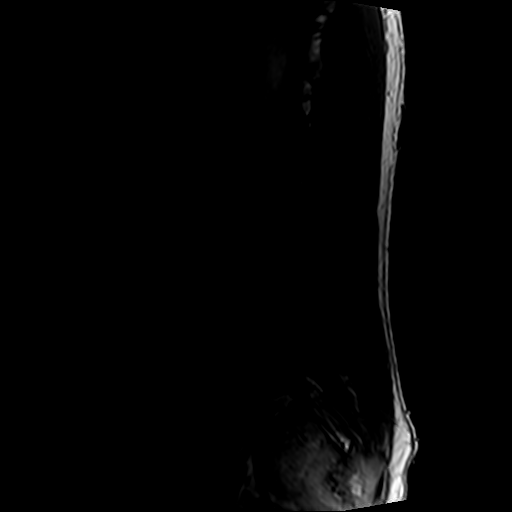

[Series 6: T2 post-contrast · sagittal · 4.0mm · 0.57mm/px · 4 of 15 slices shown]
[im 1/15]
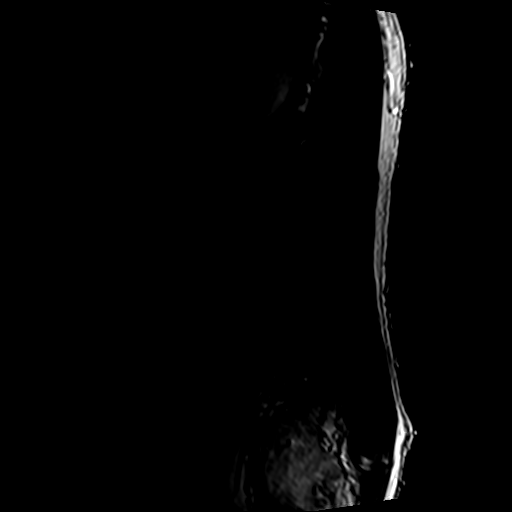
[im 5/15]
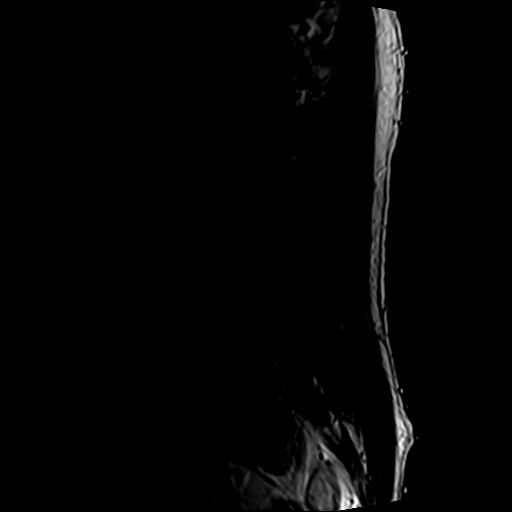
[im 10/15]
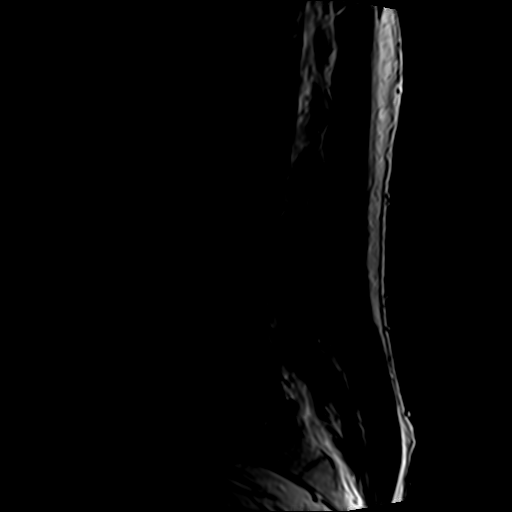
[im 15/15]
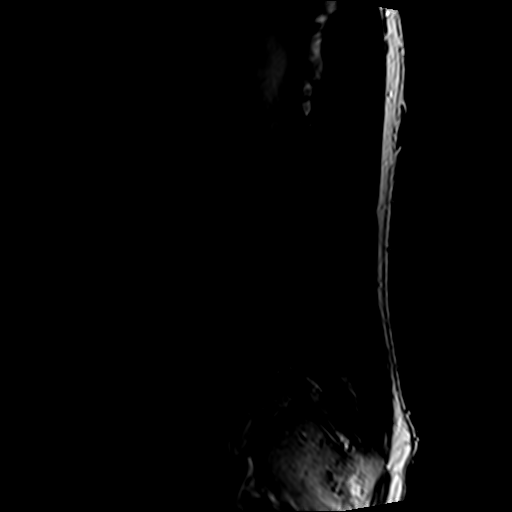

[Series 7: T2 · axial · 4.0mm · 0.70mm/px · z∈[-133,+102]mm · 11 of 42 slices shown]
[im 1/42]
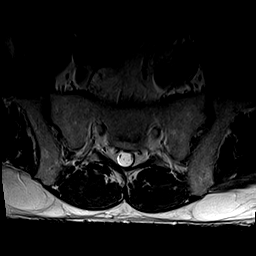
[im 5/42]
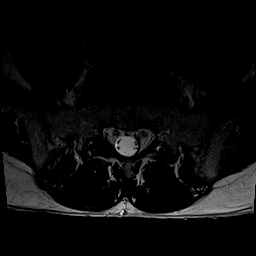
[im 9/42]
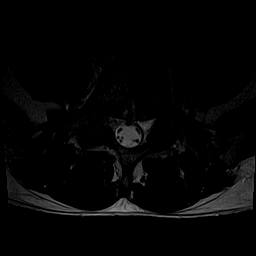
[im 13/42]
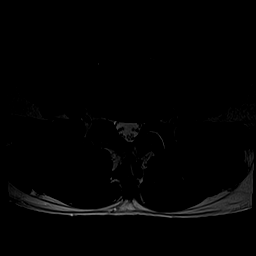
[im 17/42]
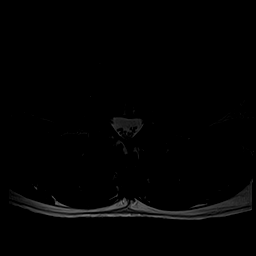
[im 21/42]
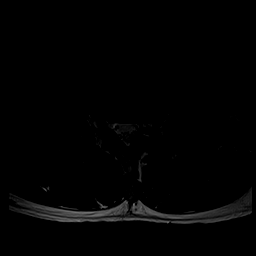
[im 25/42]
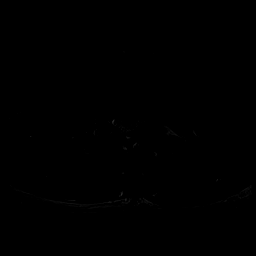
[im 29/42]
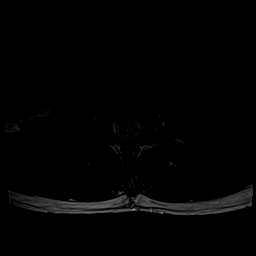
[im 33/42]
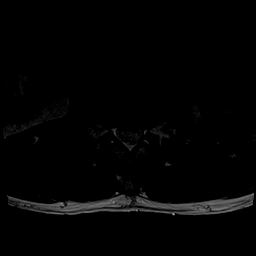
[im 37/42]
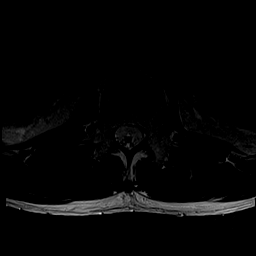
[im 42/42]
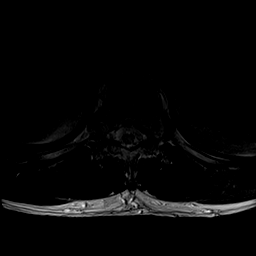

[Series 8: T1 · axial · 4.0mm · 0.35mm/px · z∈[-133,+76]mm · 6 of 42 slices shown (2 of 2)]
[im 1/42]
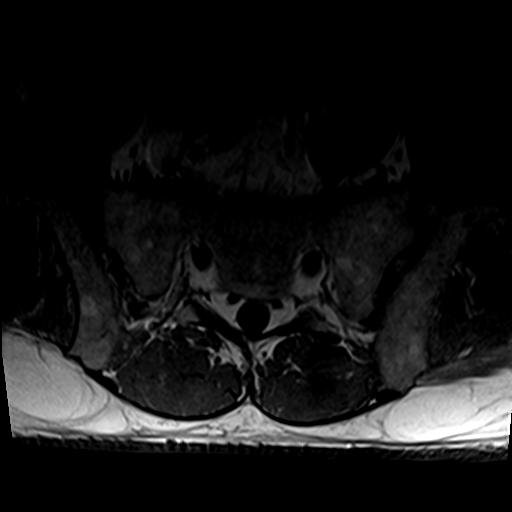
[im 5/42]
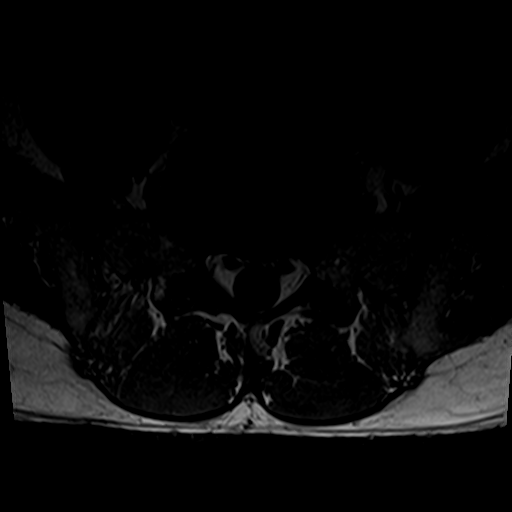
[im 9/42]
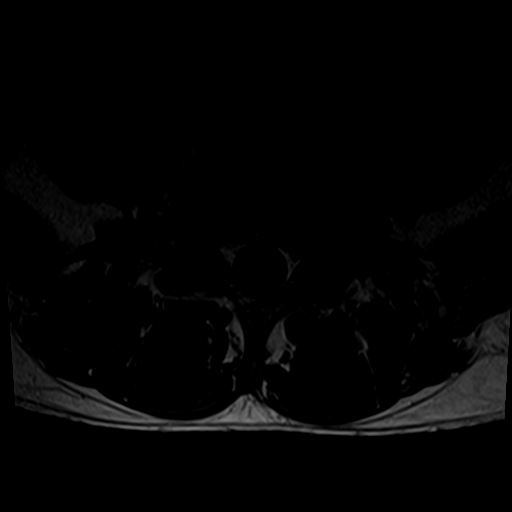
[im 13/42]
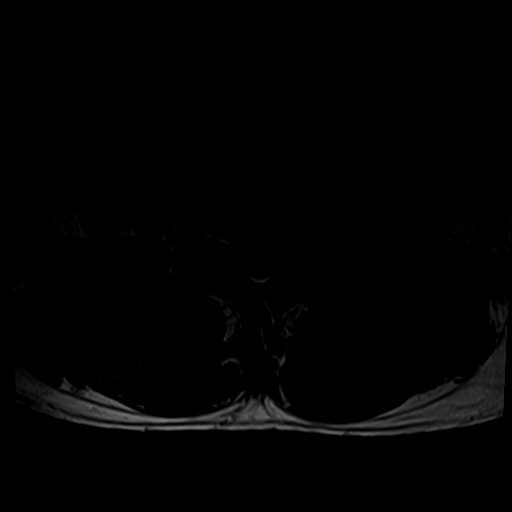
[im 21/42]
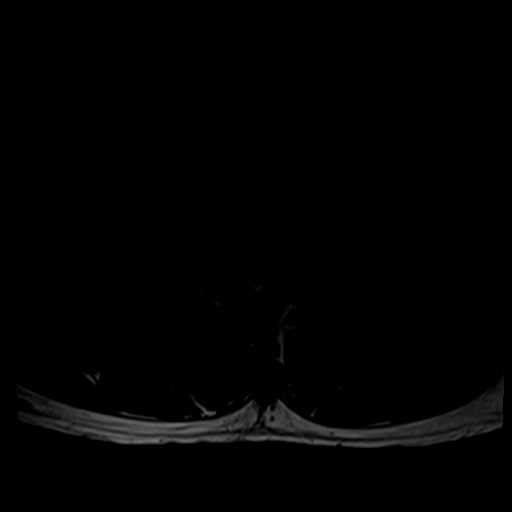
[im 37/42]
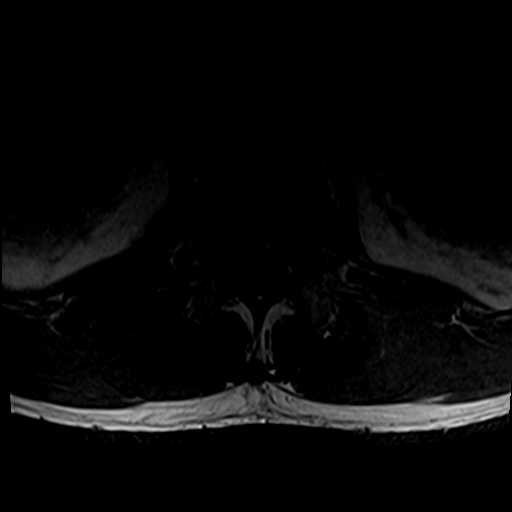

[25 of 48 positions shown; findings below may reference images not displayed]

FINDINGS: Segmentation: 5 non rib-bearing lumbar type vertebral bodies are
present. The lowest fully formed vertebral body is L5.

Alignment: Slight degenerative retrolisthesis is present L1-2 and
L2-3. Other significant listhesis is present. Lumbar lordosis is
preserved.

Vertebrae: Chronic fatty endplate marrow changes are present at
L5-S1. Marrow signal and vertebral body heights are otherwise
normal.

Conus medullaris and cauda equina: Conus extends to the L1 level.
Conus and cauda equina appear normal.

Paraspinal and other soft tissues: Benign appearing exophytic cysts
are present posteriorly in the right kidney, measuring up to 2.7 cm.
No significant adenopathy is present. No other mass lesion is
present.

Disc levels:

L1-2: Broad-based disc protrusion is present. Mild facet hypertrophy
is noted bilaterally. Mild foraminal narrowing is worse on the
right. The central canal is patent.

L2-3: A leftward disc protrusion is present. Left paramedian annular
tear is noted. This potentially impacts the traversing left L3 nerve
roots. Mild left foraminal narrowing is present.

L3-4: Leftward disc bulging is present. Disc signal is preserved.
Asymmetric left-sided facet hypertrophy and short pedicles
contribute to mild left foraminal narrowing. The central canal is
patent.

L4-5: A leftward disc protrusion is present. Mild facet hypertrophy
is noted bilaterally. Central annular tear is present. Moderate left
and mild right foraminal stenosis is present.

L5-S1: A rightward central disc protrusion is present. There is
potential contact of the traversing S1 nerve roots bilaterally. Disc
material extends into the foramina bilaterally. Mild facet spurring
is present. Moderate right and mild left foraminal narrowing is
present.
IMPRESSION: 1. Mild foraminal narrowing bilaterally at L1-2 is worse on the
right.
2. Mild left foraminal narrowing at L2-3 and L3-4 could contribute
to the patient's symptoms.
3. Mild left subarticular narrowing at L2-3 could also impact the
left L3 nerve roots.
4. Moderate left and mild right foraminal narrowing at L4-5.
5. Moderate right and mild left foraminal narrowing at L5-S1.
6. Central annular tear at L4-5.
7. Shallow central disc protrusion at L5-S[DATE] contact the
traversing S1 nerve roots bilaterally.

## 2019-11-02 MED ORDER — GADOBENATE DIMEGLUMINE 529 MG/ML IV SOLN
20.0000 mL | Freq: Once | INTRAVENOUS | Status: AC | PRN
  Administered 2019-11-02: 20 mL via INTRAVENOUS

## 2019-11-02 NOTE — Telephone Encounter (Signed)
Spoke with patient on 10/27/19 regarding a refill on Viagra 90 day supply. Patient very upset about not being about to have 90 day supply. Stressed to patient that medications that are as needed doctor can not write a 90 day supply on these. Patient asked what did Dr. Ethelene Hal have against him, reasoning why he can not have 90 days. Patient told me to tell Dr. Ethelene Hal to go F himself called Dr. Ethelene Hal all out of his name said he was a "slob" that had something against him and said thi several times F the doctor before he informed me that he will never return to see him. Patient did state that he had nothing against me he wanted me to make sure that I did let the doctor know that he said F him and for him to F himself so many times. Patient also said he was aware that he had an upcoming appointment and that we could cancel that appointment because he was not coming back to this office to see Dr. Ethelene Hal.

## 2019-11-09 ENCOUNTER — Telehealth: Payer: Self-pay | Admitting: Internal Medicine

## 2019-11-09 ENCOUNTER — Ambulatory Visit: Payer: Medicare HMO | Admitting: Family Medicine

## 2019-11-09 NOTE — Telephone Encounter (Signed)
Pt was unaware of change in medication. I informed him that everything was on his AVS and asked if he still had a copy of it he stated that he did.Pt stated that he was upset about change and I offered appt. to come in and discuss it further pt declined and stated that he would discuss it during f/u in May

## 2019-11-09 NOTE — Telephone Encounter (Signed)
Please advise 

## 2019-11-09 NOTE — Telephone Encounter (Signed)
Patient calling asking if we are able to give a sample for humalog. Patient ph# [phone]

## 2019-11-10 DIAGNOSIS — M5136 Other intervertebral disc degeneration, lumbar region: Secondary | ICD-10-CM | POA: Diagnosis not present

## 2019-11-10 DIAGNOSIS — E7800 Pure hypercholesterolemia, unspecified: Secondary | ICD-10-CM | POA: Insufficient documentation

## 2019-11-10 DIAGNOSIS — M545 Low back pain: Secondary | ICD-10-CM | POA: Diagnosis not present

## 2019-11-10 DIAGNOSIS — Z89519 Acquired absence of unspecified leg below knee: Secondary | ICD-10-CM | POA: Insufficient documentation

## 2019-11-10 DIAGNOSIS — M51369 Other intervertebral disc degeneration, lumbar region without mention of lumbar back pain or lower extremity pain: Secondary | ICD-10-CM | POA: Insufficient documentation

## 2019-11-13 DIAGNOSIS — H5213 Myopia, bilateral: Secondary | ICD-10-CM | POA: Diagnosis not present

## 2019-11-13 DIAGNOSIS — H52209 Unspecified astigmatism, unspecified eye: Secondary | ICD-10-CM | POA: Diagnosis not present

## 2019-11-14 DIAGNOSIS — M5416 Radiculopathy, lumbar region: Secondary | ICD-10-CM | POA: Diagnosis not present

## 2019-11-23 DIAGNOSIS — M5136 Other intervertebral disc degeneration, lumbar region: Secondary | ICD-10-CM | POA: Diagnosis not present

## 2019-12-11 DIAGNOSIS — M545 Low back pain: Secondary | ICD-10-CM | POA: Diagnosis not present

## 2019-12-11 DIAGNOSIS — M5136 Other intervertebral disc degeneration, lumbar region: Secondary | ICD-10-CM | POA: Diagnosis not present

## 2020-01-05 ENCOUNTER — Other Ambulatory Visit: Payer: Self-pay

## 2020-01-09 ENCOUNTER — Ambulatory Visit: Payer: Medicare HMO | Admitting: Internal Medicine

## 2020-01-09 DIAGNOSIS — Z0289 Encounter for other administrative examinations: Secondary | ICD-10-CM

## 2020-01-09 NOTE — Progress Notes (Deleted)
Name: [name]  Age/ Sex: 54 y.o., male   MRN/ DOB: [id number], Sep 30, 1965     PCP: Libby Maw, MD   Reason for Endocrinology Evaluation: Type 2 Diabetes Mellitus  Initial Endocrine Consultative Visit: 10/12/2019    PATIENT IDENTIFIER: [name] is a 54 y.o. male with a past medical history of T2Dm, HTN and Dyslipidemia . The patient has followed with Endocrinology clinic since 10/12/2019 for consultative assistance with management of his diabetes.  DIABETIC HISTORY:  [name] was diagnosed with DM in 2006. Has been on insulin for many years, basaglar caused diarrhea, he was prescribed Januvia but did not take it.  His hemoglobin A1c has ranged from 7.2% in 06/2019, peaking at 11.0%  Many years ago .   SUBJECTIVE:   During the last visit (10/12/2019): A1c 8.6% . He had limited amount of humalog samples and opted to use ReliOn brand . Started NPH insulin as well   Today (01/09/2020): [name] is here for a follow up on diabetes management.  He checks his blood sugars *** times daily, preprandial to breakfast and ***. The patient has *** had hypoglycemic episodes since the last clinic visit, which typically occur *** x / - most often occuring ***. The patient is *** symptomatic with these episodes, with symptoms of {symptoms; hypoglycemia:9084048}.       HOME DIABETES REGIMEN:  Metformin 1000 mg BID NPH 15 units QHS Regular insulin I:C ratio of 1:4    Statin: yes ACE-I/ARB: yes  METER DOWNLOAD SUMMARY: Date range evaluated: *** Fingerstick Blood Glucose Tests = *** Average Number Tests/Day = *** Overall Mean FS Glucose = *** Standard Deviation = ***  BG Ranges: Low = *** High = ***   Hypoglycemic Events/30 Days: BG < 50 = *** Episodes of symptomatic severe hypoglycemia = ***    DIABETIC COMPLICATIONS: Microvascular complications:   Neuropathy ( S/P Right BKA) , CKD   Denies: retinopathy  Last eye exam: Completed  10/18/2019  Macrovascular complications:    Denies: CAD, PVD, CVA   HISTORY:  Past Medical History:  Past Medical History:  Diagnosis Date  . Diabetes mellitus without complication ([place])   . Hypertension     Past Surgical History:  Past Surgical History:  Procedure Laterality Date  . BELOW KNEE LEG AMPUTATION Right      Social History:  reports that he has never smoked. He has never used smokeless tobacco. He reports that he does not drink alcohol or use drugs. Family History:  Family History  Problem Relation Age of Onset  . Anxiety disorder Mother   . Cancer Father       HOME MEDICATIONS: Allergies as of 01/09/2020      Reactions   Bactrim [sulfamethoxazole-trimethoprim]    Ceprotin [protein C Concentrate (human)]    Ciprofloxacin Other (See Comments)   Kidney function   Levaquin [levofloxacin]       Medication List       Accurate as of Jan 09, 2020  9:05 AM. If you have any questions, ask your nurse or doctor.        atorvastatin 20 MG tablet Commonly known as: LIPITOR Take 20 mg by mouth daily.   calcium-vitamin D 500-200 MG-UNIT tablet Commonly known as: OSCAL WITH D Take 1 tablet by mouth.   citalopram 40 MG tablet Commonly known as: CELEXA Take 40 mg by mouth daily.   insulin NPH Human 100 UNIT/ML injection Commonly known as: NOVOLIN N Inject 0.15 mLs (15  Units total) into the skin at bedtime.   insulin regular 100 units/mL injection Commonly known as: NOVOLIN R Insulin to carb ratio 1:4   lisinopril-hydrochlorothiazide 20-25 MG tablet Commonly known as: ZESTORETIC Take 1 tablet by mouth daily.   metFORMIN 1000 MG tablet Commonly known as: GLUCOPHAGE Take 1,000 mg by mouth 2 (two) times daily with a meal.   sildenafil 50 MG tablet Commonly known as: Viagra Take 1 tablet (50 mg total) by mouth daily as needed for erectile dysfunction.        OBJECTIVE:   Vital Signs: There were no vitals taken for this visit.  Wt Readings  from Last 3 Encounters:  10/12/19 229 lb (103.9 kg)  10/11/19 229 lb (103.9 kg)  09/25/19 230 lb (104.3 kg)     Exam: General: Pt appears well and is in NAD  Hydration: Well-hydrated with moist mucous membranes and good skin turgor  HEENT: Head: Unremarkable with good dentition. Oropharynx clear without exudate.  Eyes: External eye exam normal without stare, lid lag or exophthalmos.  EOM intact.  PERRL.  Neck: General: Supple without adenopathy. Thyroid: Thyroid size normal.  No goiter or nodules appreciated. No thyroid bruit.  Lungs: Clear with good BS bilat with no rales, rhonchi, or wheezes  Heart: RRR with normal S1 and S2 and no gallops; no murmurs; no rub  Abdomen: Normoactive bowel sounds, soft, nontender, without masses or organomegaly palpable  Extremities: No pretibial edema. No tremor. Normal strength and motion throughout. See detailed diabetic foot exam below.  Skin: Normal texture and temperature to palpation. No rash noted. No Acanthosis nigricans/skin tags. No lipohypertrophy.  Neuro: MS is good with appropriate affect, pt is alert and Ox3    DM foot exam: Please see diabetic assessment flow-sheet detailed below:           DATA REVIEWED:  Lab Results  Component Value Date   HGBA1C 8.6 (A) 10/11/2019   Lab Results  Component Value Date   CREATININE 1.57 (H) 09/13/2019   No results found for: MICRALBCREAT   No results found for: CHOL, HDL, LDLCALC, LDLDIRECT, TRIG, CHOLHDL       ASSESSMENT / PLAN / RECOMMENDATIONS:   1) Type {NUMBERS 1 OR 2:522190} Diabetes Mellitus, ***controlled, With *** complications - Most recent A1c of *** %. Goal A1c < *** %.  ***  Plan: MEDICATIONS:  ***  EDUCATION / INSTRUCTIONS:  BG monitoring instructions: Patient is instructed to check his blood sugars *** times a day, ***.  Call [facility] if: BG persistently < 70 or > 300. . I reviewed the Rule of 15 for the treatment of hypoglycemia in detail  with the patient. Literature supplied.  REFERRALS:  ***.   2) Diabetic complications:   Eye: Does *** have known diabetic retinopathy.   Neuro/ Feet: Does *** have known diabetic peripheral neuropathy .   Renal: Patient does *** have known baseline CKD. He   is *** on an ACEI/ARB at present. Check urine albumin/creatinine ratio yearly starting at time of diagnosis. If albuminuria is positive, treatment is geared toward better glucose, blood pressure control and use of ACE inhibitors or ARBs. Monitor electrolytes and creatinine once to twice yearly.   3) Lipids: Patient is *** on a statin.  4) Hypertension: *** at goal of < 140/90 mmHg.    F/U in ***    Signed electronically by: Mack Guise, MD  [facility]  [facility] [address] [place], [place]  [id number] Phone: [phone] FAX: [phone]   CC: Libby Maw, [place] [address] Phone: [phone]  Fax: [phone]  Return to Endocrinology clinic as below: Future Appointments  Date Time Provider [place]  01/09/2020  2:00 PM Omeed Osuna, Melanie Crazier, MD [facility] None

## 2020-02-06 DIAGNOSIS — M5136 Other intervertebral disc degeneration, lumbar region: Secondary | ICD-10-CM | POA: Diagnosis not present

## 2020-03-12 DIAGNOSIS — M5134 Other intervertebral disc degeneration, thoracic region: Secondary | ICD-10-CM | POA: Diagnosis not present

## 2020-03-12 DIAGNOSIS — M9902 Segmental and somatic dysfunction of thoracic region: Secondary | ICD-10-CM | POA: Diagnosis not present

## 2020-03-12 DIAGNOSIS — M5431 Sciatica, right side: Secondary | ICD-10-CM | POA: Diagnosis not present

## 2020-03-12 DIAGNOSIS — M5136 Other intervertebral disc degeneration, lumbar region: Secondary | ICD-10-CM | POA: Diagnosis not present

## 2020-03-12 DIAGNOSIS — M9904 Segmental and somatic dysfunction of sacral region: Secondary | ICD-10-CM | POA: Diagnosis not present

## 2020-03-12 DIAGNOSIS — M9903 Segmental and somatic dysfunction of lumbar region: Secondary | ICD-10-CM | POA: Diagnosis not present

## 2020-03-18 ENCOUNTER — Institutional Professional Consult (permissible substitution): Payer: Medicare HMO | Admitting: Emergency Medicine

## 2020-03-18 ENCOUNTER — Telehealth: Payer: Self-pay | Admitting: Emergency Medicine

## 2020-03-18 NOTE — Telephone Encounter (Signed)
Called and spoke with patient letting him know that we could get him in with Dr. Lamonte Sakai on 04/05/20 at 11am. Imformed him that there were no other openings with any other providers before that date. Patient expressed understanding. Nothing further needed at this time

## 2020-03-18 NOTE — Telephone Encounter (Signed)
Left voice mail for patient to call office and reschedule missed consult with RB today. Patient may reschedule with any doctor for a consult related to pulmonary nodules in a 30 minute time slot ASAP.

## 2020-04-04 ENCOUNTER — Ambulatory Visit (INDEPENDENT_AMBULATORY_CARE_PROVIDER_SITE_OTHER): Payer: Medicare HMO | Admitting: Physician Assistant

## 2020-04-04 ENCOUNTER — Encounter: Payer: Self-pay | Admitting: Orthopedic Surgery

## 2020-04-04 VITALS — Ht 72.0 in | Wt 229.0 lb

## 2020-04-04 DIAGNOSIS — Z449 Encounter for fitting and adjustment of unspecified external prosthetic device: Secondary | ICD-10-CM | POA: Diagnosis not present

## 2020-04-04 NOTE — Progress Notes (Addendum)
Office Visit Note   Patient: Unnamed [name]           Date of Birth: 25-Sep-1965           MRN: [id number] Visit Date: 04/04/2020              Requested by: No referring provider defined for this encounter. PCP: No primary care provider on file.  Chief Complaint  Patient presents with  . Right Leg - Pain    Hx BKA      HPI: This is a pleasant gentleman who presents today requesting a new prescription for a prosthetic. He is status post right below-knee amputation in 2017 done elsewhere.  His current prosthetic is about 4-1/54 years old.  His current suction prosthetic is ill fitting and he is developing pressure areas.  His residual limb shape has changed.  He cannot wear the current prosthetic. He has pain when he wears it when he ambulates for more than 10 minutes.  The pain dissipates when he is not wearing it. He has extra volume when he is trying to walk.  At his last visit I gave him a prescription to work with [employer] for a new prosthetic he was dissatisfied with his consultation there and is now requesting a prescription be done for prosthetic group in [place]: Visit Diagnoses: Status post right below-knee amputation with ill fitting prosthetic  Plan: A new prescription for his prosthetic was given to him. He would like to go with a prosthetic group in [place].  He contacted the [facility] by phone while I was in the room and insisted that I speak with them.  Because he wanted a very specific prosthetic I told him that I would provide any information they needed to obtain this prosthetic for this patient .  I also counseled the patient that if he began having significant pain and any skin breakdown he was not to use his current prosthetic.  He may follow-up with [place] as needed Patient is an existing right transtibial  amputee.  Patient's current comorbidities are not expected to impact the ability to function with the prescribed prosthesis. Patient verbally  communicates a strong desire to use a prosthesis. Patient currently requires mobility aids to ambulate without a prosthesis.  Expects not to use mobility aids with a new prosthesis.   Patient is a K3 level ambulator that spends a lot of time walking around on uneven terrain over obstacles, up and down stairs, and ambulates with a variable cadence.   His range of motion and MMT are within normal limits bilaterally in upper and lower extremities and he has no dexterity issues or upper extremity involvement he would benefit from a new total contact prosthetic socket replacement made of light weight carbon fiber and acrylic resin.  Gel liners will be used as an interface to protect his bony residual limb from excessive pressures.  A vacuum pump will work in conjunction with suspension sleeves to provide a suction suspension on the residual limb which will reduce pistoning and pain during ambulation, improved proprioception, and reduce energy expenditure.  Socks will be used for volume management.      Follow-Up Instructions: No follow-ups on file.   Ortho Exam  Patient is alert, oriented, no adenopathy, well-dressed, normal affect, normal respiratory effort. Right below-knee amputation stump is well-healed he does have some prominence laterally that has some pressure area where the prosthetic is been rubbing. There is however no skin breakdown excellent  knee range of motion .  Imaging: No results found. No images are attached to the encounter.  Labs: Lab Results  Component Value Date   HGBA1C 8.6 (A) 10/11/2019   REPTSTATUS 09/16/2019 FINAL 09/13/2019   CULT 60,000 COLONIES/mL VIRIDANS STREPTOCOCCUS (A) 09/13/2019     Lab Results  Component Value Date   ALBUMIN 3.4 (L) 09/13/2019    No results found for: MG No results found for: VD25OH  No results found for: PREALBUMIN CBC EXTENDED Latest Ref Rng & Units 09/13/2019  WBC 4.0 - 10.5 K/uL 8.2  RBC 4.22 - 5.81 MIL/uL 6.10(H)  HGB  13.0 - 17.0 g/dL 17.8(H)  HCT 39 - 52 % 52.4(H)  PLT 150 - 400 K/uL 244     Body mass index is 31.06 kg/m.  Orders:  No orders of the defined types were placed in this encounter.  No orders of the defined types were placed in this encounter.    Procedures: No procedures performed  Clinical Data: No additional findings.  ROS:  All other systems negative, except as noted in the HPI. Review of Systems  Objective: Vital Signs: Ht 6' (1.829 m)   Wt (!) 229 lb (103.9 kg)   BMI 31.06 kg/m   Specialty Comments:  No specialty comments available.  PMFS History: Patient Active Problem List   Diagnosis Date Noted  . Right below-knee amputee ([place]) 10/30/2019  . Dyslipidemia 10/30/2019  . Type 2 diabetes mellitus with diabetic polyneuropathy, with long-term current use of insulin ([place]) 10/12/2019  . Type 2 diabetes mellitus with stage 3a chronic kidney disease, without long-term current use of insulin ([place]) 10/12/2019  . Essential hypertension 09/25/2019  . Type 2 diabetes mellitus with hyperglycemia, with long-term current use of insulin ([place]) 09/25/2019  . Stage 3a chronic kidney disease 09/25/2019  . Hematuria 09/25/2019  . Acquired complex renal cyst 09/25/2019  . Urinary hesitancy 09/25/2019  . Prosthesis adjustments 09/25/2019  . Need for immunization against influenza 09/25/2019  . Need for Tdap vaccination 09/25/2019  . Erectile dysfunction 09/25/2019  . Healthcare maintenance 09/25/2019  . Pulmonary nodule 09/25/2019  . Depression with anxiety 09/25/2019   Past Medical History:  Diagnosis Date  . Diabetes mellitus without complication ([place])   . Hypertension     Family History  Problem Relation Age of Onset  . Anxiety disorder Mother   . Cancer Father     Past Surgical History:  Procedure Laterality Date  . BELOW KNEE LEG AMPUTATION Right    Social History   Occupational History  . Not on file  Tobacco Use  . Smoking status: Never Smoker  .  Smokeless tobacco: Never Used  Vaping Use  . Vaping Use: Never used  Substance and Sexual Activity  . Alcohol use: Never  . Drug use: Never  . Sexual activity: Yes

## 2020-04-05 ENCOUNTER — Encounter: Payer: Self-pay | Admitting: Emergency Medicine

## 2020-04-05 ENCOUNTER — Ambulatory Visit (INDEPENDENT_AMBULATORY_CARE_PROVIDER_SITE_OTHER): Payer: Medicare HMO | Admitting: Emergency Medicine

## 2020-04-05 ENCOUNTER — Other Ambulatory Visit: Payer: Self-pay

## 2020-04-05 DIAGNOSIS — R918 Other nonspecific abnormal finding of lung field: Secondary | ICD-10-CM

## 2020-04-05 DIAGNOSIS — R911 Solitary pulmonary nodule: Secondary | ICD-10-CM

## 2020-04-05 NOTE — Progress Notes (Signed)
Subjective:    [name], male    DOB: Dec 29, 1965, 54 y.o.   MRN: [id number]  HPI  54 year old never smoker with a history of hypertension, diabetes, hyperlipidemia, chronic kidney disease stage III, right BKA 2017. He has had imaging in [place] that showed pulmonary nodules, possibly larger on subsequent imaging in 2019.    He feels well, denies any dyspnea. Rare cough.   Review of Systems As per HPI  Past Medical History:  Diagnosis Date  . Diabetes mellitus without complication ([place])   . Hypertension      Family History  Problem Relation Age of Onset  . Anxiety disorder Mother   . Cancer Father     Father with renal cell CA.   Social History   Socioeconomic History  . Marital status: Divorced    Spouse name: Not on file  . Number of children: Not on file  . Years of education: Not on file  . Highest education level: Not on file  Occupational History  . Not on file  Tobacco Use  . Smoking status: Never Smoker  . Smokeless tobacco: Never Used  Vaping Use  . Vaping Use: Never used  Substance and Sexual Activity  . Alcohol use: Never  . Drug use: Never  . Sexual activity: Yes  Other Topics Concern  . Not on file  Social History Narrative  . Not on file   Social Determinants of Health   Financial Resource Strain:   . Difficulty of Paying Living Expenses:   Food Insecurity:   . Worried About Charity fundraiser in the Last Year:   . Arboriculturist in the Last Year:   Transportation Needs:   . Film/video editor (Medical):   Marland Kitchen Lack of Transportation (Non-Medical):   Physical Activity:   . Days of Exercise per Week:   . Minutes of Exercise per Session:   Stress:   . Feeling of Stress :   Social Connections:   . Frequency of Communication with Friends and Family:   . Frequency of Social Gatherings with Friends and Family:   . Attends Religious Services:   . Active Member of Clubs or Organizations:   . Attends Theatre manager Meetings:   Marland Kitchen Marital Status:   Intimate Partner Violence:   . Fear of Current or Ex-Partner:   . Emotionally Abused:   Marland Kitchen Physically Abused:   . Sexually Abused:     From [place], in 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 Bactrim [Sulfamethoxazole-Trimethoprim]   . Ceprotin [Protein C Concentrate (Human)]   . Ciprofloxacin Other (See Comments)    Kidney function  . Levaquin [Levofloxacin]      Outpatient Medications Prior to Visit  Medication Sig Dispense Refill  . atorvastatin (LIPITOR) 20 MG tablet Take 20 mg by mouth daily.    . calcium-vitamin D (OSCAL WITH D) 500-200 MG-UNIT tablet Take 1 tablet by mouth.    . citalopram (CELEXA) 40 MG tablet Take 40 mg by mouth daily.    . insulin NPH Human (NOVOLIN N) 100 UNIT/ML injection Inject 0.15 mLs (15 Units total) into the skin at bedtime. 10 mL 11  . insulin regular (NOVOLIN R) 100 units/mL injection Insulin to carb ratio 1:4 10 mL 11  . lisinopril-hydrochlorothiazide (ZESTORETIC) 20-25 MG tablet Take 1 tablet by mouth daily.    . sildenafil (VIAGRA) 50 MG tablet Take 1 tablet (50  mg total) by mouth daily as needed for erectile dysfunction. 10 tablet 0  . metFORMIN (GLUCOPHAGE) 1000 MG tablet Take 1,000 mg by mouth 2 (two) times daily with a meal. (Patient not taking: Reported on 04/05/2020)     No facility-administered medications prior to visit.         Objective:   Physical Exam Vitals:   04/05/20 1113  BP: 124/74  Pulse: (!) 108  Temp: 98 F (36.7 C)  TempSrc: Oral  SpO2: 98%  Weight: (!) 214 lb (97.1 kg)  Height: 6\' 2"  (1.88 m)   Gen: Pleasant, well-nourished, in no distress,  normal affect  ENT: No lesions,  mouth clear,  oropharynx clear, no postnasal drip  Neck: No JVD, no stridor  Lungs: No use of accessory muscles, no crackles or wheezing on normal respiration, no wheeze on forced expiration  Cardiovascular: RRR, heart sounds normal, no  murmur or gallops, no peripheral edema  Musculoskeletal: RLE BKA  Neuro: alert, awake, non focal  Skin: Warm, no lesions or rashe       Assessment & Plan:  Pulmonary nodules Hx of pulm nodules identified in [place]. He reports possible increase in size, but no details available at this time. We will repeat CT chest now, plan to get records or films from [place] for comparison.   We will perform a CT scan of the chest without contrast to follow pulmonary nodules. We will try to get copies of your prior CT images and reports from [place]. Follow Dr. Lamonte Sakai next available after your CT chest so that we can review together.  Baltazar Apo, MD, PhD 04/05/2020, 8:43 PM [facility] [phone] or if no answer [phone]

## 2020-04-05 NOTE — Assessment & Plan Note (Addendum)
Hx of pulm nodules identified in [place]. He reports possible increase in size, but no details available at this time. We will repeat CT chest now, plan to get records or films from [place] for comparison.   We will perform a CT scan of the chest without contrast to follow pulmonary nodules. We will try to get copies of your prior CT images and reports from [place]. Follow Dr. Lamonte Sakai next available after your CT chest so that we can review together.

## 2020-04-05 NOTE — Patient Instructions (Signed)
We will perform a CT scan of the chest without contrast to follow pulmonary nodules. We will try to get copies of your prior CT images and reports from [place]. Follow Dr. Lamonte Sakai next available after your CT chest so that we can review together.

## 2020-04-09 ENCOUNTER — Ambulatory Visit: Payer: Medicare HMO | Admitting: Orthopedic Surgery

## 2020-04-11 ENCOUNTER — Telehealth: Payer: Self-pay

## 2020-04-11 NOTE — Telephone Encounter (Signed)
Called pt to advise that we have received the paperwork from prosthetic company  and that there is a very specific insurance driven dictation that requires very in depth notes and this note from Thursday's visit will need to be amended. The form will be completed and requires the doctor's signature. Dr. Sharol Given is out of the office this week and will not return till Tuesday. This is the information that I had intended on giving the patient the phone call went as follows:  The pt was very angry and distressed that his paperwork could not be completed today. I made multiple attempts to explain to the pt what I could offer as solutions. He was not interested in this. Pt states that he is from [place] and that I do not understand. I stopped the pt and advised that I was also from [place] and that had nothing to do with what we were talking about. The pt states that with his previous doctor that when he needed something it was done in two days did not matter that the doctor was not in the office even if he was on vacation that it was done. It advised  that Dr. Sharol Given has had a Covid exposure and that he is in quarantine and would not be in the office until Tuesday on his first call. The pt at that time said that he did not care what we had going on here or who we were trying to take care of. The pt was yelling and cursing and asked who I thought I was talking back to him. I advised that I am not talking back that he is not my father. I am trying to offer solutions  and he will not give me the opportunity to try and speak with him. I advised that I understood his frustration and that if we could just take a minute to calm down that we can regroup and discuss this nicely. The pt asked who I thought I was his mother and then kept saying ok Mom to everything I said. He asked if I needed anything from him I said no, he asked if this was going to get taken care of on Tuesday I said yes and he said whats with all the one word  answers. I advised that we did not have anything further to discuss and I would send the paperwork on Tuesday. The pt stated at that time that he was coming to the office and he would handle it today. I advised the pt that there was no need to come to the office that there was not anything that could be done today and that I already had given him the plan. At this point he was yelling and aggressive and I advised that I would notify my manager of his intent and he said go ahead and tell her I am going. I advised that we would be calling security that this had now escalated and he said he did not care that he was coming and I disconnect the call. I notified my Freight forwarder. The prosthetic company said that they were very fearful of him and that they had a similar experience when [place] called to speak with them in the beginning . He was very aggressive, cursing, angry and threatening that if he developed skin break down because this paperwork was taking so long that it was his intention to sue the office and Audrea Muscat for the delay. This pt had an appt last Thursday  and the paperwork was received on Monday. The doctor is not in the office this week to sign the paperwork but the documentation is being worked on.

## 2020-04-12 ENCOUNTER — Ambulatory Visit (INDEPENDENT_AMBULATORY_CARE_PROVIDER_SITE_OTHER)
Admission: RE | Admit: 2020-04-12 | Discharge: 2020-04-12 | Disposition: A | Discharge status: Home / Self Care | Payer: Medicare HMO | Source: Ambulatory Visit | Attending: Emergency Medicine | Admitting: Emergency Medicine

## 2020-04-12 ENCOUNTER — Other Ambulatory Visit: Payer: Self-pay

## 2020-04-12 DIAGNOSIS — R918 Other nonspecific abnormal finding of lung field: Secondary | ICD-10-CM | POA: Diagnosis not present

## 2020-04-12 DIAGNOSIS — I2510 Atherosclerotic heart disease of native coronary artery without angina pectoris: Secondary | ICD-10-CM | POA: Diagnosis not present

## 2020-04-12 DIAGNOSIS — R911 Solitary pulmonary nodule: Secondary | ICD-10-CM | POA: Diagnosis not present

## 2020-04-12 DIAGNOSIS — S2241XA Multiple fractures of ribs, right side, initial encounter for closed fracture: Secondary | ICD-10-CM | POA: Diagnosis not present

## 2020-04-12 IMAGING — CT CT CHEST W/O CM
2 of 4 series · 15 of 36 positions shown, 18 images · non-contrast
Comparison: CT abdomen and pelvis [DATE]

CLINICAL DATA: Follow-up of lung nodule on x-ray. Nonsmoker.
Imaging in [place] showed pulmonary nodules.

EXAM:
CT CHEST WITHOUT CONTRAST
TECHNIQUE: Multidetector CT imaging of the chest was performed following the
standard protocol without IV contrast.

[Series 2: thorax · axial · 0.77mm/px · z∈[-356,-64]mm · 12 of 172 slices shown, 15 images]
[im 13/172  mediastinal]
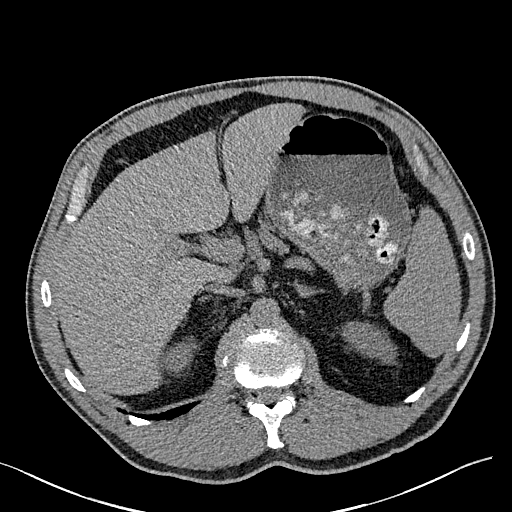
[im 13/172  lung]
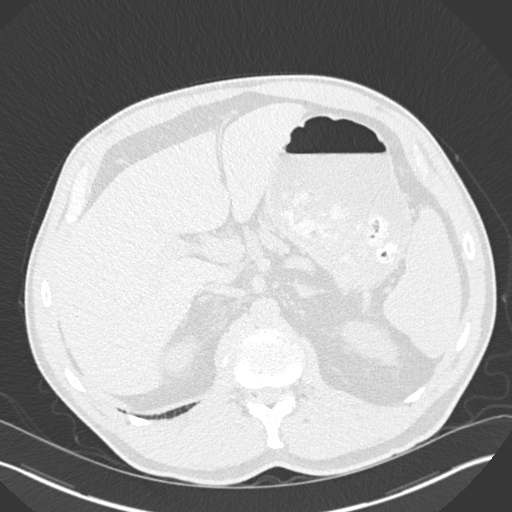
[im 25/172  lung]
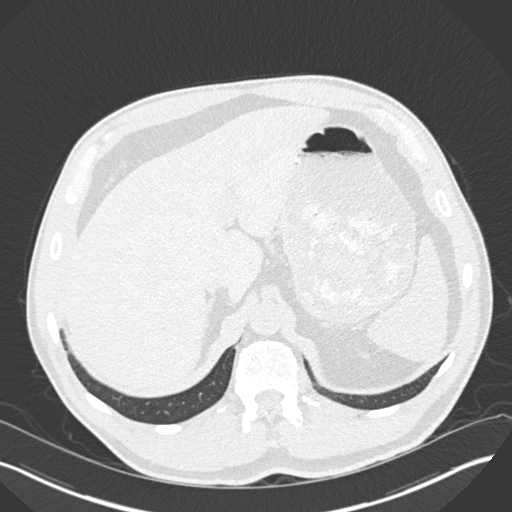
[im 37/172  lung]
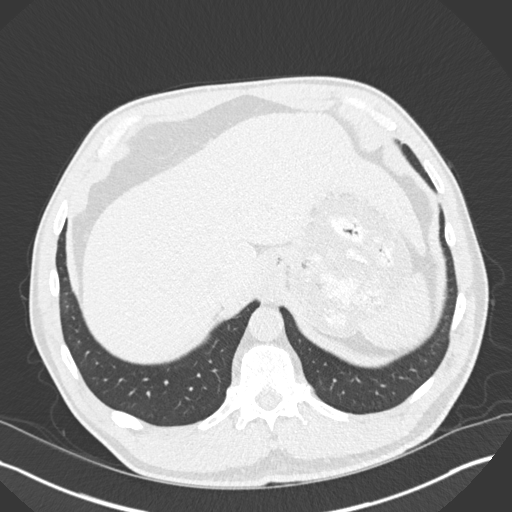
[im 49/172  lung]
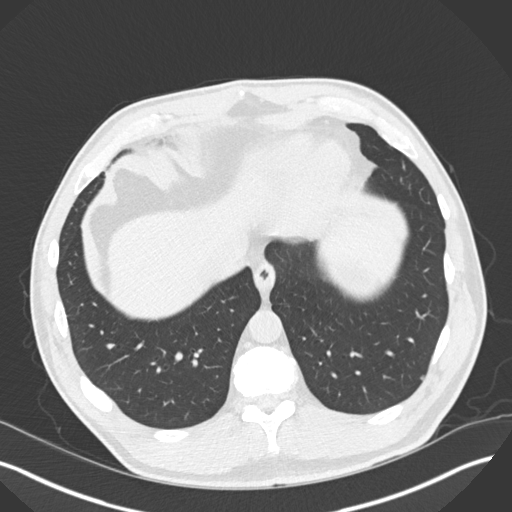
[im 62/172  mediastinal]
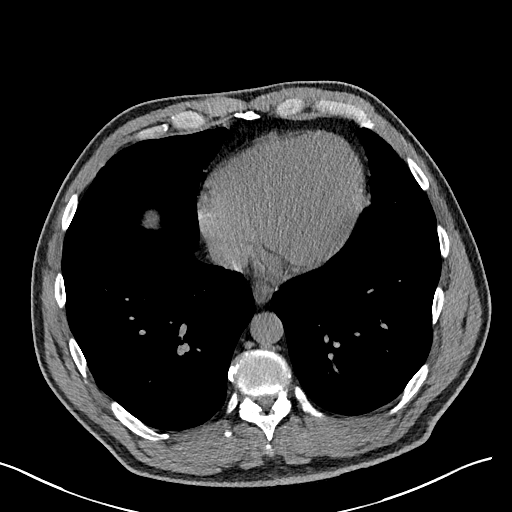
[im 62/172  lung]
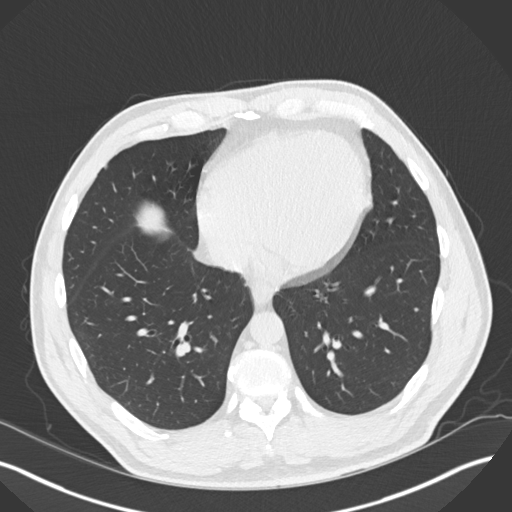
[im 74/172  lung]
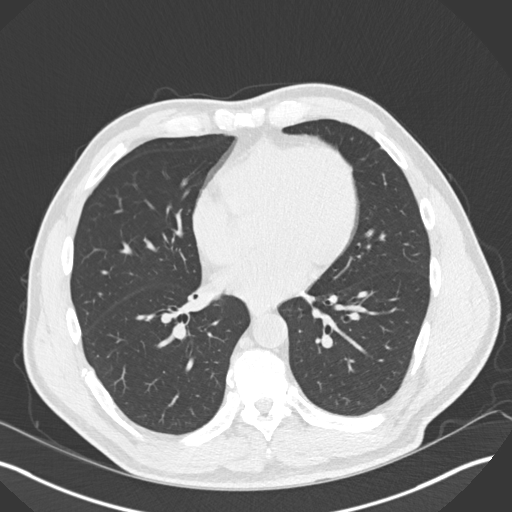
[im 98/172  lung]
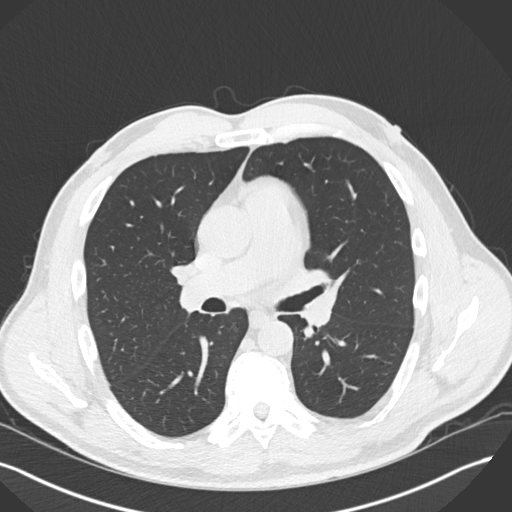
[im 110/172  lung]
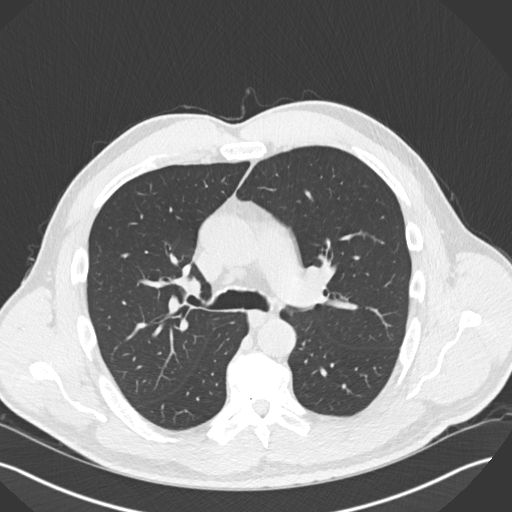
[im 123/172  mediastinal]
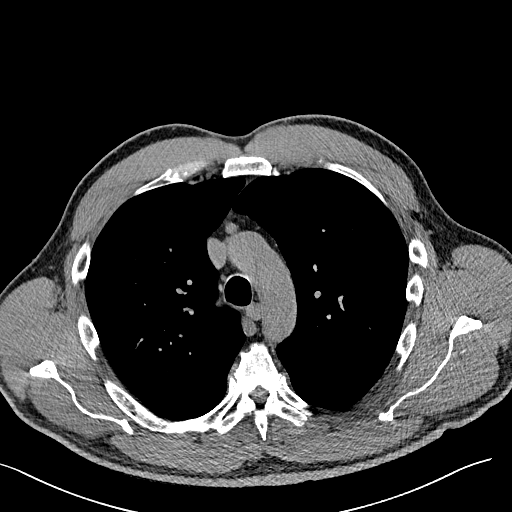
[im 123/172  lung]
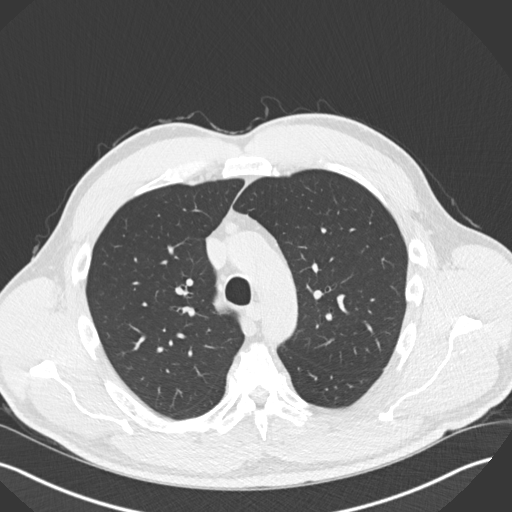
[im 135/172  lung]
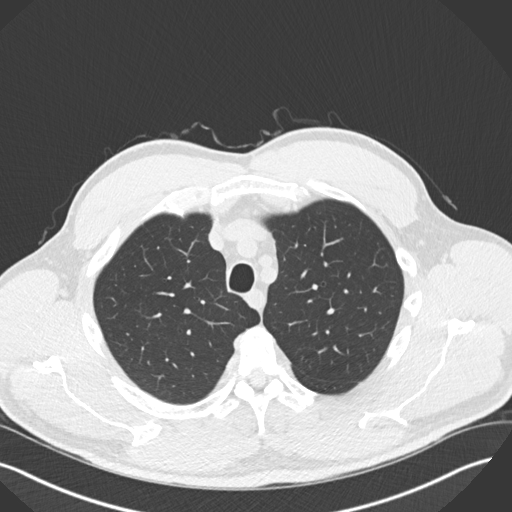
[im 147/172  lung]
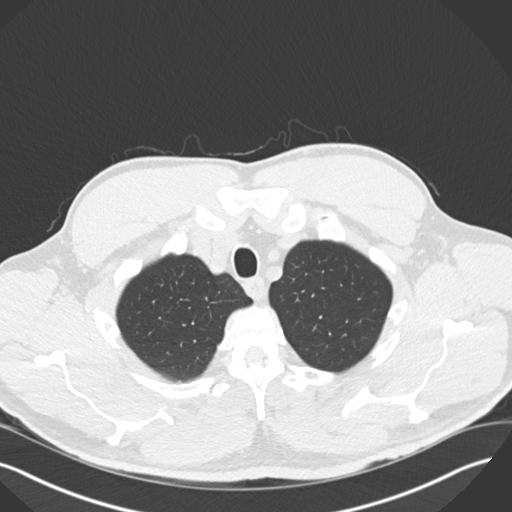
[im 159/172  lung]
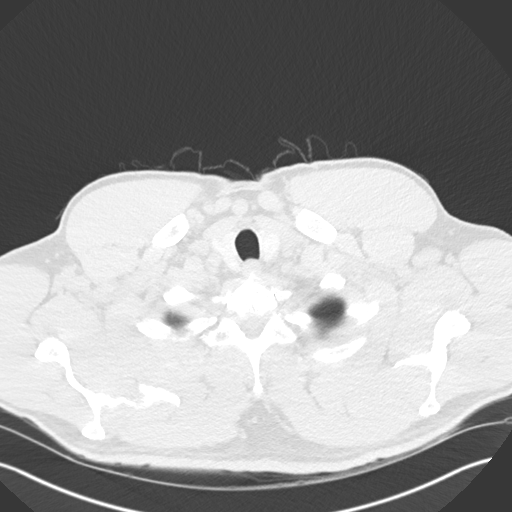

[Series 5: coronal · coronal · 0.67mm/px · 3 of 151 slices shown]
[im 31/151  lung]
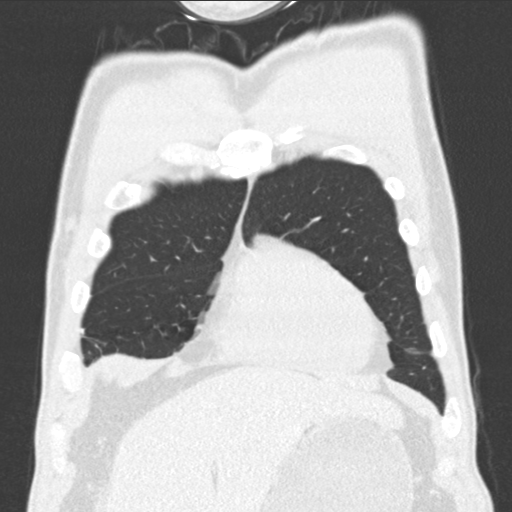
[im 61/151  lung]
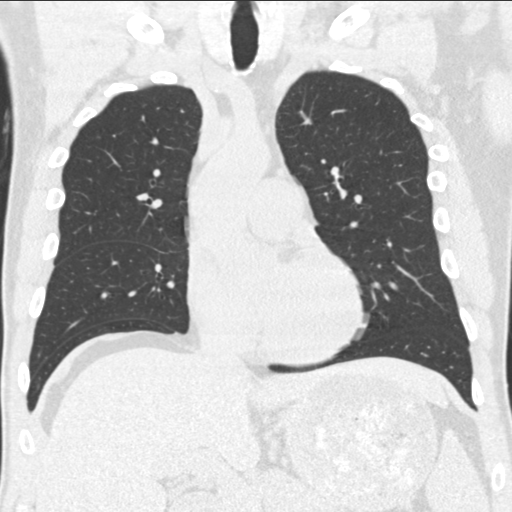
[im 91/151  lung]
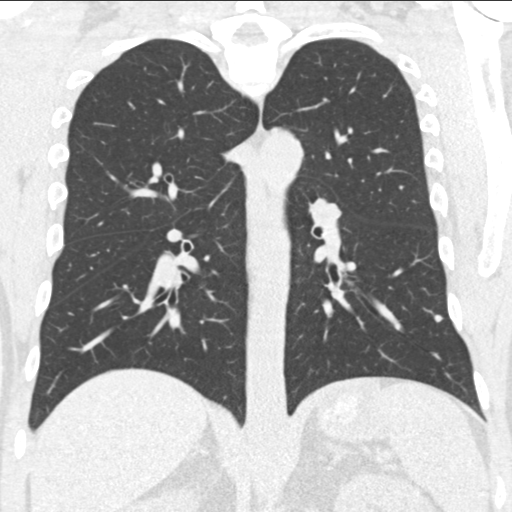

[15 of 36 positions shown; findings below may reference images not displayed]

FINDINGS: Cardiovascular: Normal heart size. No pericardial effusions. Normal
caliber thoracic aorta. Coronary artery calcifications.

Mediastinum/Nodes: Esophagus is decompressed. Scattered mediastinal
lymph nodes are not pathologically enlarged.

Lungs/Pleura: Left lower lobe pulmonary nodules are demonstrated.
Largest is in the superior segment, series 4, image 108, measuring 5
mm diameter. 3 mm nodule in the left lower lung, image 110. 4 mm
subpleural nodule in the left lower lung, image 123. Additional
scattered punctate lung nodules are demonstrated. Focal nodule in
the right minor fissure, image 93, measuring 4 mm diameter, likely
an inter fissural lymph node. Nodules appear similar to previous
abdominal CT. No consolidation or airspace disease. No pleural
effusions. Airways are patent.

Upper Abdomen: No acute abnormality.

Musculoskeletal: Old right rib fractures. No destructive bone
lesions.
IMPRESSION: Multiple bilateral pulmonary nodules, largest measuring 5 mm
diameter. Nodules appear similar to previous abdominal CT. No
evidence of active pulmonary disease.

No follow-up needed if patient is low-risk (and has no known or
suspected primary neoplasm). Non-contrast chest CT can be considered
in 12 months if patient is high-risk. This recommendation follows
the consensus statement: Guidelines for Management of Incidental
Pulmonary Nodules Detected on CT Images: From the [HOSPITAL]

## 2020-04-15 DIAGNOSIS — N281 Cyst of kidney, acquired: Secondary | ICD-10-CM | POA: Diagnosis not present

## 2020-04-16 ENCOUNTER — Ambulatory Visit (INDEPENDENT_AMBULATORY_CARE_PROVIDER_SITE_OTHER): Payer: Medicare HMO | Admitting: Emergency Medicine

## 2020-04-16 ENCOUNTER — Telehealth: Payer: Self-pay | Admitting: Emergency Medicine

## 2020-04-16 ENCOUNTER — Encounter: Payer: Self-pay | Admitting: Emergency Medicine

## 2020-04-16 DIAGNOSIS — R918 Other nonspecific abnormal finding of lung field: Secondary | ICD-10-CM

## 2020-04-16 DIAGNOSIS — R911 Solitary pulmonary nodule: Secondary | ICD-10-CM

## 2020-04-16 NOTE — Telephone Encounter (Signed)
I went ahead and changed pt's appt to a televisit. Called and spoke with pt letting him know this had been done and he verbalized understanding. Nothing further needed.

## 2020-04-16 NOTE — Addendum Note (Signed)
Addended by: Gavin Potters R on: 04/16/2020 03:25 PM   Modules accepted: Orders

## 2020-04-16 NOTE — Telephone Encounter (Signed)
Routing message to [place] who is working with RB this afternoon.  [name], please see message from pt and make sure RB is okay with this being a televisit. If so, we can change that in pt's appt notes and go ahead and arrive the pt so it will be ready for the visit.

## 2020-04-16 NOTE — Progress Notes (Addendum)
   Subjective:    [name], male    DOB: 05-14-1966, 54 y.o.   MRN: [id number]   Telephone visit 04/16/2020 -- Virtual Visit via Telephone Note  I connected with [name] on 04/16/20 at  2:15 PM EDT by telephone and verified that I am speaking with the correct person using two identifiers.  Location: Patient: Home Provider: Office   I discussed the limitations, risks, security and privacy concerns of performing an evaluation and management service by telephone and the availability of in person appointments. I also discussed with the patient that there may be a patient responsible charge related to this service. The patient expressed understanding and agreed to proceed.   History of Present Illness: 54 year old never smoker with a history of hypertension, diabetes, hyperlipidemia, chronic kidney disease stage III, right BKA 2017. He has had imaging in [place] that showed pulmonary nodules, possibly larger on subsequent imaging in 2019.    Observations/Objective: Data now available from [place]: CT scan of the abdomen and pelvis from 02/01/2017 showed old fractures of the right eighth ninth and 10th ribs with nonunion, several small left lower lobe pulmonary nodules largest 3 mm. CT chest 08/18/2017 showed multiple subpleural nodules bilaterally largest of the left lung base 4 mm with some calcification. CT scan of the chest 04/12/2020 done here and reviewed by me, shows pulmonary nodules that are stable compared with a CT abdomen 10/24/2019.  The largest is a 5 mm nodule in the left lower lobe, a 4 mm intrafissural lymph node in the right minor fissure.  Scattered other nodules that are smaller.   He reports that he is doing well, still dealing with his prosthetic. Has some back pain.   Assessment and Plan: Bilateral pulmonary nodules are likely granulomatous in nature.  Very little change if any since December 2018.  The index nodule in the left lower lobe is  now measured at 5 mm.  Given the possible interval change I think we should repeat one final scan 1 year from now to ensure stability.  If that one is stable then we can stop following.  Follow Up Instructions: Follow-up to review his CT chest in August 2022 or sooner if any problems   I discussed the assessment and treatment plan with the patient. The patient was provided an opportunity to ask questions and all were answered. The patient agreed with the plan and demonstrated an understanding of the instructions.   The patient was advised to call back or seek an in-person evaluation if the symptoms worsen or if the condition fails to improve as anticipated.  Time spent communicating with pt by phone 17 minutes.    Baltazar Apo, MD, PhD 04/16/2020, 2:31 PM [facility] [phone] or if no answer [phone]

## 2020-04-17 ENCOUNTER — Telehealth: Payer: Self-pay | Admitting: Physician Assistant

## 2020-04-17 NOTE — Telephone Encounter (Signed)
Dr. Marlou Sa signed each page of office note, order and this was faxed to the number below as requested. Advised to call if any additional information is needed.

## 2020-04-17 NOTE — Telephone Encounter (Signed)
Holding for signature will fax once this is complete.

## 2020-04-17 NOTE — Telephone Encounter (Signed)
[name] from [facility] called.  She wanted to check the status of the PA's visit notes that were to be cosigned by Dr. Marlou Sa and sent to them   Fax number: [phone] Call back: [phone]

## 2020-04-19 DIAGNOSIS — N281 Cyst of kidney, acquired: Secondary | ICD-10-CM | POA: Diagnosis not present

## 2020-04-22 DIAGNOSIS — N281 Cyst of kidney, acquired: Secondary | ICD-10-CM | POA: Diagnosis not present

## 2020-05-27 DIAGNOSIS — Z89511 Acquired absence of right leg below knee: Secondary | ICD-10-CM | POA: Diagnosis not present

## 2020-05-29 ENCOUNTER — Ambulatory Visit (HOSPITAL_COMMUNITY)
Admission: EM | Admit: 2020-05-29 | Discharge: 2020-05-29 | Disposition: A | Discharge status: Home / Self Care | Payer: Medicare HMO | Attending: Family Medicine | Admitting: Family Medicine

## 2020-05-29 ENCOUNTER — Encounter (HOSPITAL_COMMUNITY): Payer: Self-pay

## 2020-05-29 ENCOUNTER — Other Ambulatory Visit: Payer: Self-pay

## 2020-05-29 DIAGNOSIS — L02811 Cutaneous abscess of head [any part, except face]: Secondary | ICD-10-CM

## 2020-05-29 MED ORDER — DOXYCYCLINE HYCLATE 100 MG PO CAPS: 100.0000 mg | ORAL_CAPSULE | Freq: Two times a day (BID) | ORAL | 0 refills | Status: DC

## 2020-05-29 MED ORDER — LIDOCAINE HCL 2 % IJ SOLN
INTRAMUSCULAR | Status: AC
  Filled 2020-05-29: qty 20

## 2020-05-29 NOTE — ED Notes (Signed)
BP reported to Dr. Mannie Stabile

## 2020-05-29 NOTE — ED Provider Notes (Signed)
[place]   [id number] 05/29/20 Arrival Time: 0900  ASSESSMENT & PLAN:  1. Abscess of scalp     Incision and Drainage Procedure Note  Anesthesia: 2% plain lidocaine  Procedure Details  The procedure, risks and complications have been discussed in detail (including, but not limited to pain and bleeding) with the patient.  The skin induration was prepped and draped in the usual fashion. After adequate local anesthesia, I&D with a #11 blade was performed on the right occipital scalp with scant purulent/bloody drainage.  EBL: minimal Drains: none Packing: n/a Condition: Tolerated procedure well Complications: none.  Meds ordered this encounter  Medications  . doxycycline (VIBRAMYCIN) 100 MG capsule    Sig: Take 1 capsule (100 mg total) by mouth 2 (two) times daily.    Dispense:  14 capsule    Refill:  0   Finish all antibiotics. OTC analgesics as needed.  Reviewed expectations re: course of current medical issues. Questions answered. Outlined signs and symptoms indicating need for more acute intervention. Patient verbalized understanding. After Visit Summary given.   SUBJECTIVE:  [name] is a 54 y.o. male who presents with a possible infection of his posterior scalp; 1-2 weeks. Area is painful. No drainage. Afebrile.   OBJECTIVE:  Vitals:   05/29/20 1048  BP: (!) 170/112  Pulse: 96  Resp: (!) 21  Temp: 98.2 F (36.8 C)  TempSrc: Oral  SpO2: 97%     General appearance: alert; no distress Skin: approx 0.5 cm induration of his right occipital scalp; tender to touch; no active drainage or bleeding Psychological: alert and cooperative; normal mood and affect  Allergies  Allergen Reactions  . Bactrim [Sulfamethoxazole-Trimethoprim]   . Ceprotin [Protein C Concentrate (Human)]   . Ciprofloxacin Other (See Comments)    Kidney function  . Levaquin [Levofloxacin]     Past Medical History:  Diagnosis Date  . Diabetes mellitus without  complication ([place])   . Hypertension    Social History   Socioeconomic History  . Marital status: Divorced    Spouse name: Not on file  . Number of children: Not on file  . Years of education: Not on file  . Highest education level: Not on file  Occupational History  . Not on file  Tobacco Use  . Smoking status: Never Smoker  . Smokeless tobacco: Never Used  Vaping Use  . Vaping Use: Never used  Substance and Sexual Activity  . Alcohol use: Never  . Drug use: Never  . Sexual activity: Yes  Other Topics Concern  . Not on file  Social History Narrative  . Not on file   Social Determinants of Health   Financial Resource Strain:   . Difficulty of Paying Living Expenses: Not on file  Food Insecurity:   . Worried About Charity fundraiser in the Last Year: Not on file  . Ran Out of Food in the Last Year: Not on file  Transportation Needs:   . Lack of Transportation (Medical): Not on file  . Lack of Transportation (Non-Medical): Not on file  Physical Activity:   . Days of Exercise per Week: Not on file  . Minutes of Exercise per Session: Not on file  Stress:   . Feeling of Stress : Not on file  Social Connections:   . Frequency of Communication with Friends and Family: Not on file  . Frequency of Social Gatherings with Friends and Family: Not on file  . Attends Religious Services: Not on file  .  Active Member of Clubs or Organizations: Not on file  . Attends Archivist Meetings: Not on file  . Marital Status: Not on file   Family History  Problem Relation Age of Onset  . Anxiety disorder Mother   . Cancer Father    Past Surgical History:  Procedure Laterality Date  . BELOW KNEE LEG AMPUTATION Right            Vanessa Kick, MD 05/29/20 1120

## 2020-05-29 NOTE — ED Triage Notes (Signed)
Pt states he is having some ingrown hair in  the back of his head for the past 2 weeks. Pt states is painful when he tried to squeeze and stick with a needle.

## 2020-10-01 DIAGNOSIS — Z89511 Acquired absence of right leg below knee: Secondary | ICD-10-CM | POA: Diagnosis not present

## 2020-11-04 DIAGNOSIS — N281 Cyst of kidney, acquired: Secondary | ICD-10-CM | POA: Diagnosis not present

## 2020-11-07 DIAGNOSIS — H5213 Myopia, bilateral: Secondary | ICD-10-CM | POA: Diagnosis not present

## 2020-11-07 DIAGNOSIS — H52209 Unspecified astigmatism, unspecified eye: Secondary | ICD-10-CM | POA: Diagnosis not present

## 2020-11-08 DIAGNOSIS — I700 Atherosclerosis of aorta: Secondary | ICD-10-CM | POA: Diagnosis not present

## 2020-11-08 DIAGNOSIS — N281 Cyst of kidney, acquired: Secondary | ICD-10-CM | POA: Diagnosis not present

## 2020-11-08 DIAGNOSIS — R160 Hepatomegaly, not elsewhere classified: Secondary | ICD-10-CM | POA: Diagnosis not present

## 2020-11-08 DIAGNOSIS — N200 Calculus of kidney: Secondary | ICD-10-CM | POA: Diagnosis not present

## 2020-11-14 DIAGNOSIS — N451 Epididymitis: Secondary | ICD-10-CM | POA: Diagnosis not present

## 2020-11-14 DIAGNOSIS — N281 Cyst of kidney, acquired: Secondary | ICD-10-CM | POA: Diagnosis not present

## 2020-12-10 ENCOUNTER — Encounter: Payer: Self-pay | Admitting: Orthopedic Surgery

## 2020-12-10 ENCOUNTER — Ambulatory Visit: Payer: Medicare HMO | Admitting: Orthopedic Surgery

## 2020-12-10 DIAGNOSIS — Z89511 Acquired absence of right leg below knee: Secondary | ICD-10-CM

## 2020-12-10 NOTE — Progress Notes (Signed)
Office Visit Note   Patient: [name]           Date of Birth: 07-02-1966           MRN: [id number] Visit Date: 12/10/2020              Requested by: No referring provider defined for this encounter. PCP: Patient, No Pcp Per (Inactive)  Chief Complaint  Patient presents with  . Right Leg - Follow-up      HPI: Patient is a 55 year old gentleman right transtibial amputee who has ulceration on the residual limb pressure proximally secondary to a poor fitting socket.  Patient is a K3 level ambulator.  Assessment & Plan: Visit Diagnoses:  1. Right below-knee amputee [facility])     Plan: Patient is given a prescription for a new socket shrinker liner material and supplies.  Patient was given instructions that if the blister on the residual limb gets worse we would need to see him immediately.  Follow-Up Instructions: Return if symptoms worsen or fail to improve.   Ortho Exam  Patient is alert, oriented, no adenopathy, well-dressed, normal affect, normal respiratory effort. Examination patient has pressure areas proximally over the knee and medial tibial plateau secondary to subsidence in the socket.  He has an end bearing ulcer on the residual limb secondary to subsidence.  Patient is an existing right transtibial  amputee.  Patient's current comorbidities are not expected to impact the ability to function with the prescribed prosthesis. Patient verbally communicates a strong desire to use a prosthesis. Patient currently requires mobility aids to ambulate without a prosthesis.  Expects not to use mobility aids with a new prosthesis.  Patient is a K3 level ambulator that spends a lot of time walking around on uneven terrain over obstacles, up and down stairs, and ambulates with a variable cadence.  Patient is in need of a new prosthetic socket due to residual limb shape change and change in volume.  Patient is currently highly motivated to continue at his K3 level of ambulation.   Patient has normal gait no upper extremity involvement or issues he has good balance.  His current socket is over 73 year old with a significant change in the shape of the residual limb.  Patient will need a new total contact prosthesis with endoskeleton prosthesis.  Imaging: No results found. No images are attached to the encounter.  Labs: Lab Results  Component Value Date   HGBA1C 8.6 (A) 10/11/2019   REPTSTATUS 09/16/2019 FINAL 09/13/2019   CULT 60,000 COLONIES/mL VIRIDANS STREPTOCOCCUS (A) 09/13/2019     Lab Results  Component Value Date   ALBUMIN 3.4 (L) 09/13/2019    No results found for: MG No results found for: VD25OH  No results found for: PREALBUMIN CBC EXTENDED Latest Ref Rng & Units 09/13/2019  WBC 4.0 - 10.5 K/uL 8.2  RBC 4.22 - 5.81 MIL/uL 6.10(H)  HGB 13.0 - 17.0 g/dL 17.8(H)  HCT 39.0 - 52.0 % 52.4(H)  PLT 150 - 400 K/uL 244     There is no height or weight on file to calculate BMI.  Orders:  No orders of the defined types were placed in this encounter.  No orders of the defined types were placed in this encounter.    Procedures: No procedures performed  Clinical Data: No additional findings.  ROS:  All other systems negative, except as noted in the HPI. Review of Systems  Objective: Vital Signs: There were no vitals taken for this visit.  Specialty Comments:  No specialty comments available.  PMFS History: Patient Active Problem List   Diagnosis Date Noted  . Right below-knee amputee ([place]) 10/30/2019  . Dyslipidemia 10/30/2019  . Type 2 diabetes mellitus with diabetic polyneuropathy, with long-term current use of insulin ([place]) 10/12/2019  . Type 2 diabetes mellitus with stage 3a chronic kidney disease, without long-term current use of insulin ([place]) 10/12/2019  . Essential hypertension 09/25/2019  . Type 2 diabetes mellitus with hyperglycemia, with long-term current use of insulin ([place]) 09/25/2019  . Stage 3a chronic kidney disease  ([place]) 09/25/2019  . Hematuria 09/25/2019  . Acquired complex renal cyst 09/25/2019  . Urinary hesitancy 09/25/2019  . Prosthesis adjustments 09/25/2019  . Need for immunization against influenza 09/25/2019  . Need for Tdap vaccination 09/25/2019  . Erectile dysfunction 09/25/2019  . Healthcare maintenance 09/25/2019  . Pulmonary nodules 09/25/2019  . Depression with anxiety 09/25/2019   Past Medical History:  Diagnosis Date  . Diabetes mellitus without complication ([place])   . Hypertension     Family History  Problem Relation Age of Onset  . Anxiety disorder Mother   . Cancer Father     Past Surgical History:  Procedure Laterality Date  . BELOW KNEE LEG AMPUTATION Right    Social History   Occupational History  . Not on file  Tobacco Use  . Smoking status: Never Smoker  . Smokeless tobacco: Never Used  Vaping Use  . Vaping Use: Never used  Substance and Sexual Activity  . Alcohol use: Never  . Drug use: Never  . Sexual activity: Yes

## 2020-12-11 ENCOUNTER — Telehealth: Payer: Self-pay | Admitting: Orthopedic Surgery

## 2020-12-11 NOTE — Telephone Encounter (Signed)
Dictated note was faxed to [phone] as requested.

## 2020-12-11 NOTE — Telephone Encounter (Signed)
Patient called advised [employer] need the office notes faxed  to them from [place] visit.  The number to contact patient is [phone]

## 2020-12-19 ENCOUNTER — Telehealth: Payer: Self-pay | Admitting: Orthopedic Surgery

## 2020-12-19 NOTE — Telephone Encounter (Signed)
12/10/20 ov note faxed with socket order & CMN to [place] [phone]

## 2021-01-08 DIAGNOSIS — Z89511 Acquired absence of right leg below knee: Secondary | ICD-10-CM | POA: Diagnosis not present

## 2021-02-10 DIAGNOSIS — N451 Epididymitis: Secondary | ICD-10-CM | POA: Diagnosis not present

## 2021-03-25 DIAGNOSIS — M5136 Other intervertebral disc degeneration, lumbar region: Secondary | ICD-10-CM | POA: Diagnosis not present

## 2021-03-25 DIAGNOSIS — M47816 Spondylosis without myelopathy or radiculopathy, lumbar region: Secondary | ICD-10-CM | POA: Insufficient documentation

## 2021-04-08 ENCOUNTER — Other Ambulatory Visit: Payer: Self-pay

## 2021-04-08 ENCOUNTER — Ambulatory Visit (INDEPENDENT_AMBULATORY_CARE_PROVIDER_SITE_OTHER)
Admission: RE | Admit: 2021-04-08 | Discharge: 2021-04-08 | Disposition: A | Discharge status: Home / Self Care | Payer: Medicare HMO | Source: Ambulatory Visit | Attending: Emergency Medicine | Admitting: Emergency Medicine

## 2021-04-08 DIAGNOSIS — J90 Pleural effusion, not elsewhere classified: Secondary | ICD-10-CM | POA: Diagnosis not present

## 2021-04-08 DIAGNOSIS — R918 Other nonspecific abnormal finding of lung field: Secondary | ICD-10-CM | POA: Diagnosis not present

## 2021-04-08 DIAGNOSIS — R911 Solitary pulmonary nodule: Secondary | ICD-10-CM | POA: Diagnosis not present

## 2021-04-08 IMAGING — CT CT CHEST W/O CM
2 of 4 series · 15 of 36 positions shown, 18 images · non-contrast
Comparison: [DATE]

CLINICAL DATA: Follow-up lung nodules, progressive shortness of
breath

EXAM:
CT CHEST WITHOUT CONTRAST
TECHNIQUE: Multidetector CT imaging of the chest was performed following the
standard protocol without IV contrast.

[Series 2: thorax · axial · 0.78mm/px · z∈[+1391,+1689]mm · 12 of 177 slices shown, 15 images]
[im 14/177  mediastinal]
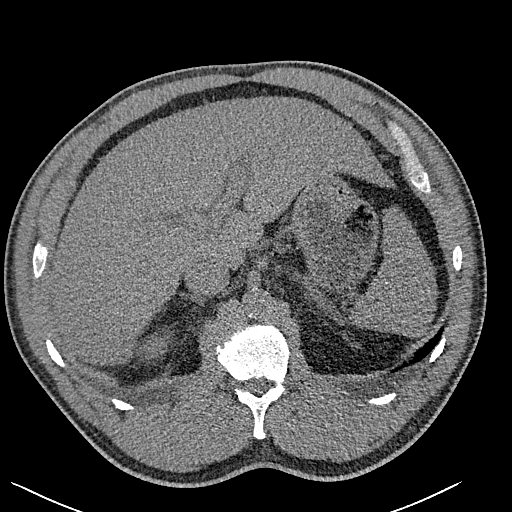
[im 14/177  lung]
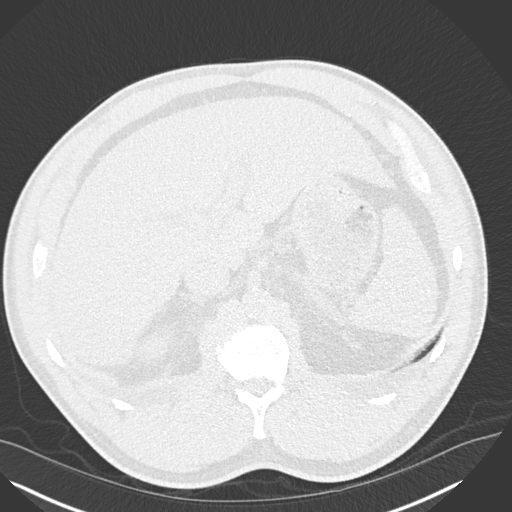
[im 28/177  lung]
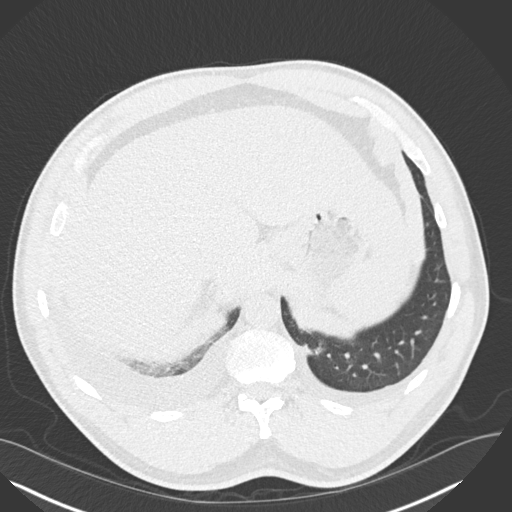
[im 41/177  lung]
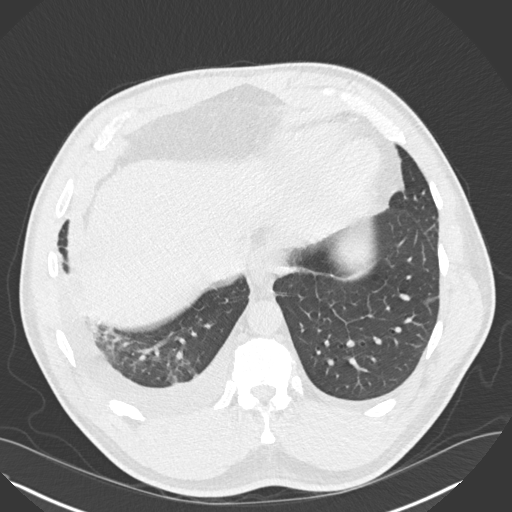
[im 55/177  lung]
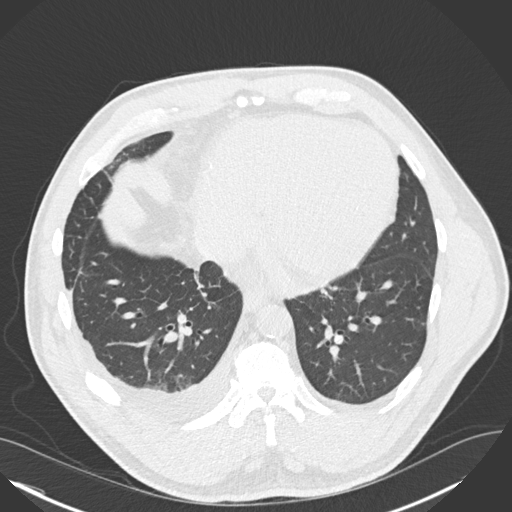
[im 68/177  mediastinal]
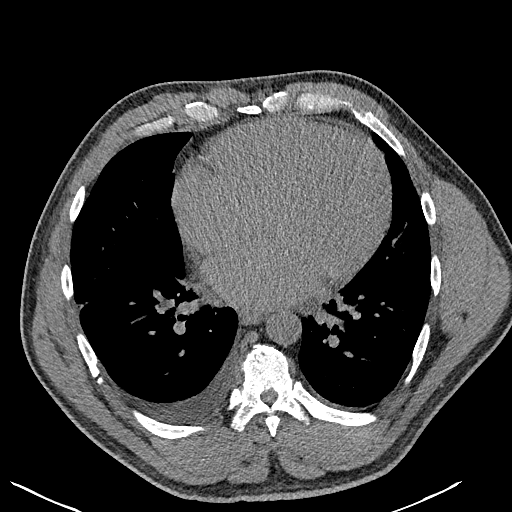
[im 68/177  lung]
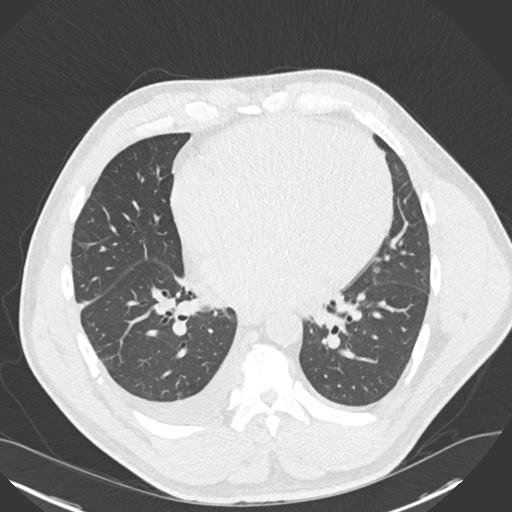
[im 82/177  lung]
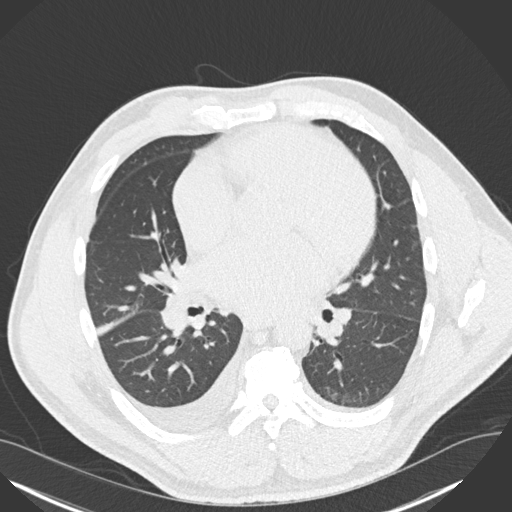
[im 95/177  lung]
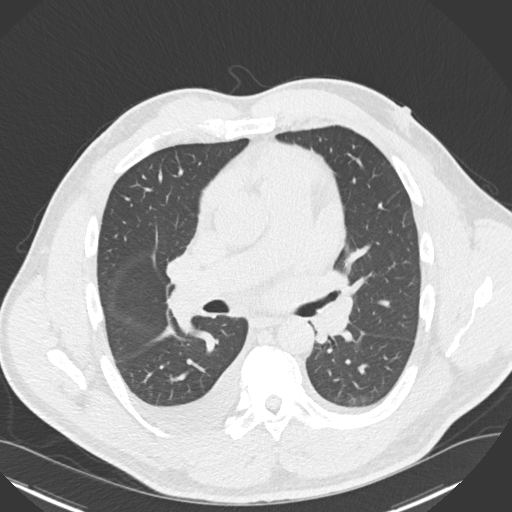
[im 109/177  lung]
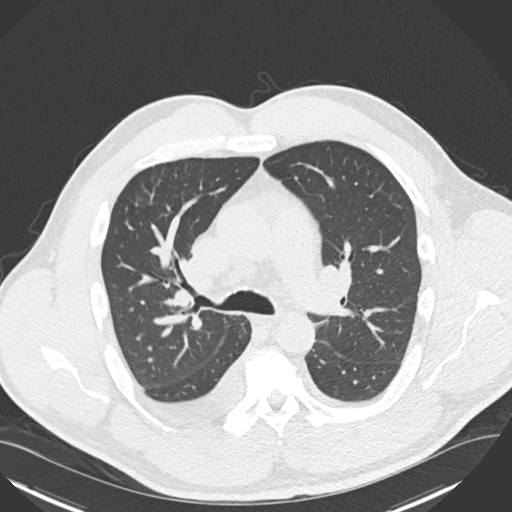
[im 122/177  mediastinal]
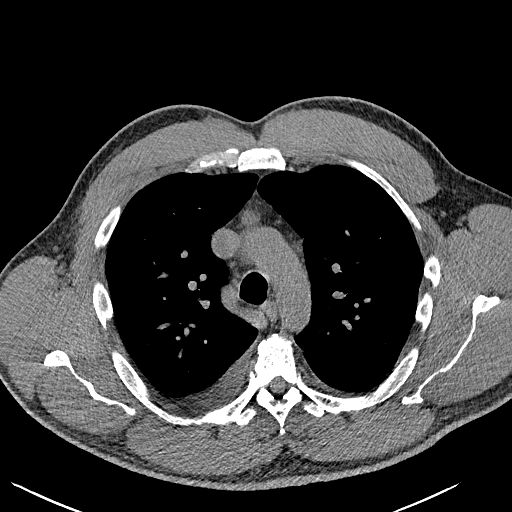
[im 122/177  lung]
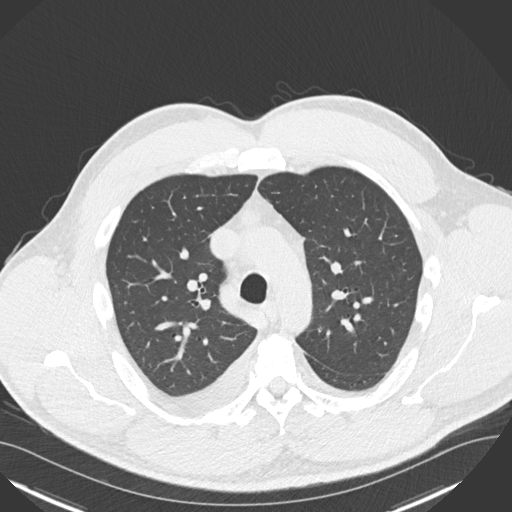
[im 136/177  lung]
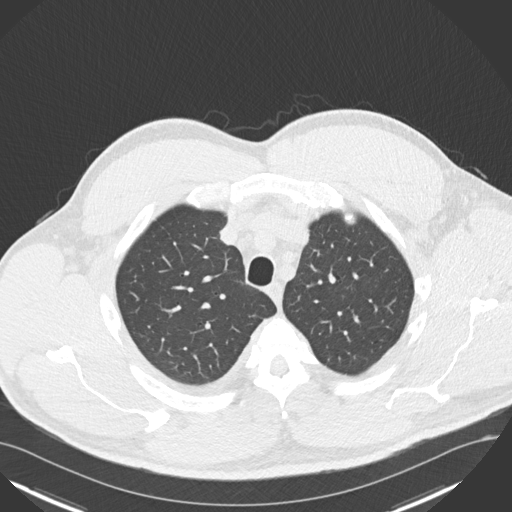
[im 149/177  lung]
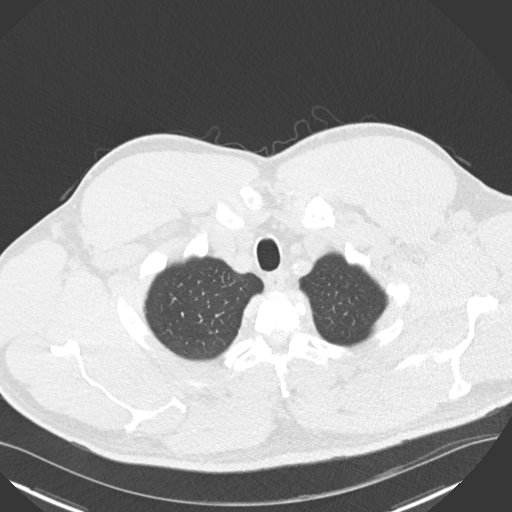
[im 163/177  lung]
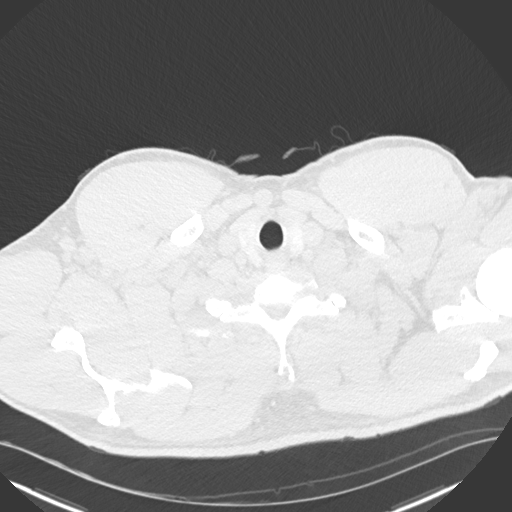

[Series 5: coronal · coronal · 0.69mm/px · 3 of 160 slices shown]
[im 32/160  lung]
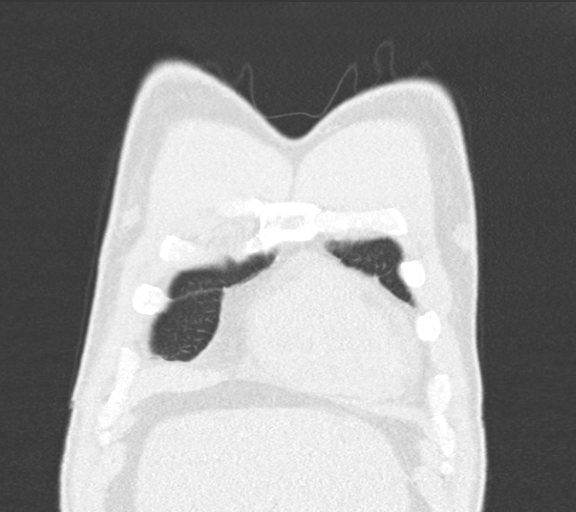
[im 64/160  lung]
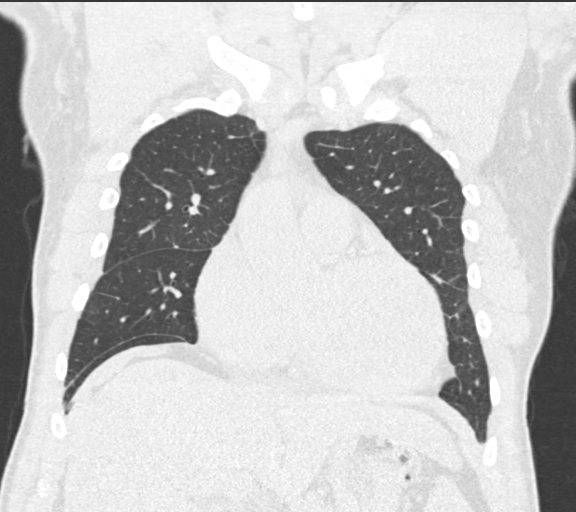
[im 96/160  lung]
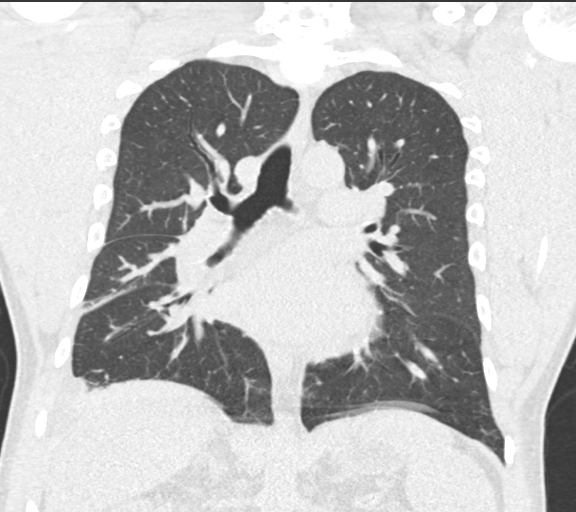

[15 of 36 positions shown; findings below may reference images not displayed]

FINDINGS: Cardiovascular: No significant vascular findings. Cardiomegaly.
Three-vessel coronary artery calcifications. No pericardial
effusion.

Mediastinum/Nodes: No enlarged mediastinal, hilar, or axillary lymph
nodes. Thyroid gland, trachea, and esophagus demonstrate no
significant findings.

Lungs/Pleura: Small right, trace left pleural effusions and
associated atelectasis or consolidation. Unchanged small pulmonary
nodules in the left lower lobe, measuring 5 mm or smaller (series 3,
image 125). Unchanged 3 mm nodule of the right middle lobe (series
3, image 93).

Upper Abdomen: No acute abnormality. Coarse, nodular contour of the
liver in the included upper abdomen.

Musculoskeletal: No chest wall mass or suspicious bone lesions
identified.
IMPRESSION: 1. Unchanged small pulmonary nodules measuring 5 mm and smaller,
definitively benign. No further follow-up or characterization is
required.
2. Small right, trace left pleural effusions and associated
atelectasis or consolidation.
3. Cardiomegaly and coronary artery disease.
4. Coarse, nodular contour of the liver in the included upper
abdomen, suggestive of cirrhosis.

## 2021-04-10 ENCOUNTER — Telehealth: Payer: Self-pay | Admitting: Emergency Medicine

## 2021-04-10 DIAGNOSIS — I517 Cardiomegaly: Secondary | ICD-10-CM

## 2021-04-10 NOTE — Telephone Encounter (Signed)
Called patient but he did not answer. Left message for patient to call back.  °

## 2021-04-11 NOTE — Telephone Encounter (Signed)
Patient wants results to CT done on Tuesday  He is short of breath when walking and doing things. and was told off record by someone he would need to see a Cardiologist.   Sending to Dr. Lamonte Sakai for recommendations Patient slightly upset with the length taking for results and now he feels not well.

## 2021-04-11 NOTE — Telephone Encounter (Signed)
I spoke with the pt and notified him of Dr Agustina Caroli response. Pt verbalized understanding. Referral to cards was placed.

## 2021-04-11 NOTE — Telephone Encounter (Signed)
Let him know that I have reviewed his CT scan of the chest.  His pulmonary nodules are all unchanged.  He does not need any further CT scan follow-up for these.  Good news  He does have some enlargement of the heart, some evidence for possible coronary disease on the CT scan, but these findings are nonspecific.  I do want him to see a cardiologist to evaluate further if he is willing to do so.  If he agrees then please refer him to [facility] cardiology for cardiomegaly and risk stratification for CAD.

## 2021-04-11 NOTE — Telephone Encounter (Signed)
Pt calling because he is upset that RB has not read his CT and he stating that he is not feeling well. Pt is stating that the radiologist told him that he would need to see a cardiologist and he is says that would take another two weeks to be seen. Pt can be reached at [phone]. Pt is aware of the 4 day turn around that it takes for the doc to read the results and give them to the patient. Pt is also wanting to see about switching providers because he states that it should not take RB this long to read the results of the CT he ordered.

## 2021-04-12 DIAGNOSIS — M47816 Spondylosis without myelopathy or radiculopathy, lumbar region: Secondary | ICD-10-CM | POA: Diagnosis not present

## 2021-04-14 ENCOUNTER — Other Ambulatory Visit: Payer: Medicare HMO

## 2021-04-15 ENCOUNTER — Telehealth: Payer: Self-pay | Admitting: Emergency Medicine

## 2021-04-15 DIAGNOSIS — I517 Cardiomegaly: Secondary | ICD-10-CM

## 2021-04-15 DIAGNOSIS — Z89511 Acquired absence of right leg below knee: Secondary | ICD-10-CM | POA: Diagnosis not present

## 2021-04-15 NOTE — Telephone Encounter (Signed)
Call made to patient, confirmed DOB. Patient states RB instructed him to see a cardiologist ASAP stating he has a enlarged heart and SOB and feels like he needs to be seen sooner. His appt is scheduled for 9/27. I inquired as to whether he called the cardiologist office to see if they could see him sooner, denies. I made the patient aware typically if these things are urgent the provider will make [place] aware and we will proceed accordingly however, patient insisted that I check with provider.   RB does patient need to be seen sooner than 9/27? I    Thanks :)

## 2021-04-15 NOTE — Telephone Encounter (Signed)
I have called the pt and he is aware of RB recs.  He requested that we try to see if we can get him an appt with another cardiologist.  He stated that he was seen at [facility] today and his stump is even swollen, so he knows that he is retaining lots of fluids and he stated that he is still having SHOB.   RB are you ok if we try and get him in with another cardiology office?  Thanks

## 2021-04-15 NOTE — Telephone Encounter (Signed)
No, the consult needs to be done but it is not urgent. If they can move him up then great - but not an emergency.

## 2021-04-16 NOTE — Telephone Encounter (Signed)
Yes that's ok with me

## 2021-04-16 NOTE — Telephone Encounter (Signed)
I put another referral in for patient to see if he can be seen sooner than September per patient's request. He has an appointment already scheduled.   Nothing further needed at this time.

## 2021-04-16 NOTE — Telephone Encounter (Signed)
Patient checking on the Cardiologist appointment. Patient phone number is [phone].

## 2021-04-17 ENCOUNTER — Ambulatory Visit: Payer: Medicare HMO | Admitting: Cardiology

## 2021-04-17 DIAGNOSIS — L089 Local infection of the skin and subcutaneous tissue, unspecified: Secondary | ICD-10-CM

## 2021-04-17 DIAGNOSIS — E11628 Type 2 diabetes mellitus with other skin complications: Secondary | ICD-10-CM | POA: Insufficient documentation

## 2021-04-17 DIAGNOSIS — E119 Type 2 diabetes mellitus without complications: Secondary | ICD-10-CM | POA: Insufficient documentation

## 2021-04-17 HISTORY — DX: Local infection of the skin and subcutaneous tissue, unspecified: L08.9

## 2021-04-17 HISTORY — DX: Local infection of the skin and subcutaneous tissue, unspecified: E11.628

## 2021-04-17 NOTE — Progress Notes (Deleted)
Cardiology Office Note:    Date:  04/17/2021   ID:  [name], DOB 06/05/1966, MRN [id number]  PCP:  Patient, No Pcp Per (Inactive)  Cardiologist:  Shirlee More, MD   Referring MD: Collene Gobble, MD  ASSESSMENT:    No diagnosis found. PLAN:    In order of problems listed above:  ***  Next appointment   Medication Adjustments/Labs and Tests Ordered: Current medicines are reviewed at length with the patient today.  Concerns regarding medicines are outlined above.  No orders of the defined types were placed in this encounter.  No orders of the defined types were placed in this encounter.    No chief complaint on file. ***  History of Present Illness:    [name] is a 55 y.o. male with a history of hypertension type 2 diabetes stage III CKD and a right below-knee amputation 2017 and pulmonary nodules who is being seen today for the evaluation of edema at the request of Byrum, Rose Fillers, MD.  Chart review shows he has had no cardiology evaluation or diagnostic testing performed. Chest CT without contrast 04/08/2021 shows unchanged small pulmonary nodules small right and trace left pleural effusion cardiomegaly and three-vessel coronary artery calcification.  The appearance of the liver somewhat suggestive of cirrhosis. Past Medical History:  Diagnosis Date   Diabetes mellitus without complication ([place])    Hypertension     Past Surgical History:  Procedure Laterality Date   BELOW KNEE LEG AMPUTATION Right     Current Medications: No outpatient medications have been marked as taking for the 04/17/21 encounter (Appointment) with Richardo Priest, MD.     Allergies:   Bactrim [sulfamethoxazole-trimethoprim], Ceprotin [protein c concentrate (human)], Ciprofloxacin, and Levaquin [levofloxacin]   Social History   Socioeconomic History   Marital status: Divorced    Spouse name: Not on file   Number of children: Not on file   Years of education: Not on file    Highest education level: Not on file  Occupational History   Not on file  Tobacco Use   Smoking status: Never   Smokeless tobacco: Never  Vaping Use   Vaping Use: Never used  Substance and Sexual Activity   Alcohol use: Never   Drug use: Never   Sexual activity: Yes  Other Topics Concern   Not on file  Social History Narrative   Not on file   Social Determinants of Health   Financial Resource Strain: Not on file  Food Insecurity: Not on file  Transportation Needs: Not on file  Physical Activity: Not on file  Stress: Not on file  Social Connections: Not on file     Family History: The patient's ***family history includes Anxiety disorder in his mother; Cancer in his father.  ROS:   ROS Please see the history of present illness.    *** All other systems reviewed and are negative.  EKGs/Labs/Other Studies Reviewed:    The following studies were reviewed today: ***  EKG:  EKG is *** ordered today.  The ekg ordered today is personally reviewed and demonstrates ***  Recent Labs: No results found for requested labs within last 8760 hours.  Recent Lipid Panel No results found for: CHOL, TRIG, HDL, CHOLHDL, VLDL, LDLCALC, LDLDIRECT  Physical Exam:    VS:  There were no vitals taken for this visit.    Wt Readings from Last 3 Encounters:  04/05/20 (!) 214 lb (97.1 kg)  04/04/20 (!) 229 lb (103.9 kg)  10/12/19  229 lb (103.9 kg)     GEN: *** Well nourished, well developed in no acute distress HEENT: Normal NECK: No JVD; No carotid bruits LYMPHATICS: No lymphadenopathy CARDIAC: ***RRR, no murmurs, rubs, gallops RESPIRATORY:  Clear to auscultation without rales, wheezing or rhonchi  ABDOMEN: Soft, non-tender, non-distended MUSCULOSKELETAL:  No edema; No deformity  SKIN: Warm and dry NEUROLOGIC:  Alert and oriented x 3 PSYCHIATRIC:  Normal affect     Signed, Shirlee More, MD  04/17/2021 12:11 PM    [facility]

## 2021-04-24 ENCOUNTER — Ambulatory Visit (INDEPENDENT_AMBULATORY_CARE_PROVIDER_SITE_OTHER): Payer: Medicare HMO | Admitting: Cardiovascular Disease

## 2021-04-24 ENCOUNTER — Encounter: Payer: Self-pay | Admitting: Cardiovascular Disease

## 2021-04-24 ENCOUNTER — Other Ambulatory Visit: Payer: Self-pay

## 2021-04-24 VITALS — BP 170/62 | HR 101 | Resp 20 | Ht 75.0 in | Wt 238.0 lb

## 2021-04-24 DIAGNOSIS — R0602 Shortness of breath: Secondary | ICD-10-CM

## 2021-04-24 DIAGNOSIS — I509 Heart failure, unspecified: Secondary | ICD-10-CM

## 2021-04-24 MED ORDER — FUROSEMIDE 40 MG PO TABS: 40.0000 mg | ORAL_TABLET | Freq: Every day | ORAL | 1 refills | Status: DC

## 2021-04-24 MED ORDER — POTASSIUM CHLORIDE CRYS ER 20 MEQ PO TBCR: 20.0000 meq | EXTENDED_RELEASE_TABLET | Freq: Every day | ORAL | 1 refills | Status: DC

## 2021-04-24 NOTE — Patient Instructions (Addendum)
Medication Instructions:  START Furosemide 40 mg once daily START Potassium 20 mEq once daily  *If you need a refill on your cardiac medications before your next appointment, please call your pharmacy*   Lab Work: Your provider would like for you to return in 2 weeks to have the following labs drawn: CMET, BNP, Magnesium, and C-Peptide. You do not need an appointment for the lab. Once in our office lobby there is a podium where you can sign in and ring the doorbell to alert [place] that you are here. The lab is open from 8:00 am to 4:30 pm; closed for lunch from 12:45pm-1:45pm.  If you have labs (blood work) drawn today and your tests are completely normal, you will receive your results only by: [place] (if you have MyChart) OR A paper copy in the mail If you have any lab test that is abnormal or we need to change your treatment, we will call you to review the results.   Testing/Procedures: Your physician has requested that you have an echocardiogram. Echocardiography is a painless test that uses sound waves to create images of your heart. It provides your doctor with information about the size and shape of your heart and how well your heart's chambers and valves are working. You may receive an ultrasound enhancing agent through an IV if needed to better visualize your heart during the echo.This procedure takes approximately one hour. There are no restrictions for this procedure. This will take place at the [address], [address].   Follow-Up: At [facility], you and your health needs are our priority.  As part of our continuing mission to provide you with exceptional heart care, we have created designated Provider Care Teams.  These Care Teams include your primary Cardiologist (physician) and Advanced Practice Providers (APPs -  Physician Assistants and Nurse Practitioners) who all work together to provide you with the care you need, when you need it.  We recommend signing up for  the patient portal called "MyChart".  Sign up information is provided on this After Visit Summary.  MyChart is used to connect with patients for Virtual Visits (Telemedicine).  Patients are able to view lab/test results, encounter notes, upcoming appointments, etc.  Non-urgent messages can be sent to your provider as well.   To learn more about what you can do with MyChart, go to [email or web address].    Your next appointment:   Follow up in 3-4 weeks with Dr. Sallyanne Kuster

## 2021-04-24 NOTE — Progress Notes (Signed)
Cardiology Office Note:    Date:  04/29/2021   ID:  [name], DOB [birth year], MRN [id number]  PCP:  Patient, No Pcp Per (Inactive)   [facility] Providers Cardiologist:  Sanda Klein, MD     Referring MD: Collene Gobble, MD   Chief Complaint  Patient presents with   Coronary calcification  [name] is a 55 y.o. male who is being seen today for the evaluation of three-vessel coronary artery calcification seen on CT of the chest performed 04/09/2021 at the request of Byrum, Rose Fillers, MD.   History of Present Illness:    [name] is a 55 y.o. male with a hx of type 2 diabetes mellitus, essential hypertension, family history of early onset CAD, history of below the knee amputation for gangrene of the right foot (after failed transmetatarsal amputation), referred in consultation by Dr. Lamonte Sakai after chest CT disclosed the presence of multivessel coronary artery calcification and cardiomegaly.  The study was performed for follow-up on multiple pulmonary nodules, which are felt to be likely granulomatous in nature.  His biggest complaint is shortness of breath with even limited activity.  He can ambulate with a prosthesis and gets immediately short of breath.  He has been sleeping upright for weeks or maybe months.  He appears to describe both orthopnea and PND.  He is not sure whether he is ever had an echocardiogram.  He denies problems with chest pain either at rest or with activity.   He has recently developed swelling in the stump of his right below the knee amputation.  He has been evaluated in the past by Dr. Sharol Given for ulceration on the amputation stump, secondary to a poorly fitting socket.  [name] reports that he has tried multiple different prosthetic devices with poor fit.  He has some swelling on the right side, but none in the left limb.  He denies palpitations, dizziness, syncope, focal neurological complaints.  He does not recall having claudication before his  amputation.  He has not had recent fever or chills.  He denies cough or hemoptysis.  Noncontrast contrast chest CT performed 04/09/2021 (for follow-up of pulmonary nodules) showed cardiomegaly, three-vessel coronary artery disease and coarsened nodular liver contour suggestive of cirrhosis  [name] is originally from [place] state and much of his medical history is not available for direct review.  He had a longstanding history of diabetes mellitus (since 2006, age 45) and feels confident that it was diagnosed as being type 2 diabetes.  In 2015 he had gangrene and reported osteomyelitis of the right foot and underwent a transmetatarsal amputation that did not heal and eventually was followed by a right below the knee amputation in 2017.  He is very upset that may be the amputation was not necessary since he was told he had osteomyelitis and the final pathology report did not confirm osteomyelitis.  He also has 2 toes amputated on the left.  He does not recall ever having an evaluation for arterial insufficiency of the lower extremities.  He is confident he has never had invasive angiography.  Has used steroids for bodybuilding for many years to the point that he developed testicular atrophy and hypoandrogenism and is now receiving testosterone supplementation.  His diabetes is being managed with insulin alone.  He does not want to take metformin which he believes is very toxic and is skeptical of any other oral antidiabetics and skeptical of medications in general.  His father had bypass surgery at age 20 and  later had to undergo redo bypass surgery.  [name] makes it clear that he will "never undergo major surgery".  He is being monitored for a mass in his right kidney by his urologist, Dr. Louis Meckel that is suspected to represent renal cell carcinoma.  He does not want to have surgery for this either.   Past Medical History:  Diagnosis Date   Diabetes mellitus without complication ([place] [place])    Hypertension      Past Surgical History:  Procedure Laterality Date   BELOW KNEE LEG AMPUTATION Right     Current Medications: Current Meds  Medication Sig   furosemide (LASIX) 40 MG tablet Take 1 tablet (40 mg total) by mouth daily.   insulin NPH Human (NOVOLIN N) 100 UNIT/ML injection Inject 0.15 mLs (15 Units total) into the skin at bedtime.   Naproxen Sodium 220 MG CAPS Aleve   Oxycodone HCl 10 MG TABS Take 10 mg by mouth once as needed.   potassium chloride SA (KLOR-CON) 20 MEQ tablet Take 1 tablet (20 mEq total) by mouth daily.   sildenafil (VIAGRA) 50 MG tablet Take 1 tablet (50 mg total) by mouth daily as needed for erectile dysfunction.     Allergies:   Bactrim [sulfamethoxazole-trimethoprim], Ceprotin [protein c concentrate (human)], Ciprofloxacin, and Levaquin [levofloxacin]   Social History   Socioeconomic History   Marital status: Divorced    Spouse name: Not on file   Number of children: Not on file   Years of education: Not on file   Highest education level: Not on file  Occupational History   Not on file  Tobacco Use   Smoking status: Never   Smokeless tobacco: Never  Vaping Use   Vaping Use: Never used  Substance and Sexual Activity   Alcohol use: Never   Drug use: Never   Sexual activity: Yes  Other Topics Concern   Not on file  Social History Narrative   Not on file   Social Determinants of Health   Financial Resource Strain: Not on file  Food Insecurity: Not on file  Transportation Needs: Not on file  Physical Activity: Not on file  Stress: Not on file  Social Connections: Not on file     Family History: The patient's family history includes Anxiety disorder in his mother; Cancer in his father.  Bypass surgery and redo bypass surgery in his father age 99  ROS:   Please see the history of present illness.     All other systems reviewed and are negative.  EKGs/Labs/Other Studies Reviewed:    The following studies were reviewed today: CT of the chest  August 2022  EKG:  EKG is ordered today.  The ekg ordered today demonstrates sinus tachycardia 101 bpm, left atrial abnormality, left ventricular hypertrophy with repolarization changes, QS pattern in lead V1-V2, T wave inversion leads V4-V6, 1, and aVL.  QTc prolonged at 484 ms  Recent Labs: No results found for requested labs within last 8760 hours.  Recent Lipid Panel No results found for: CHOL, TRIG, HDL, CHOLHDL, VLDL, LDLCALC, LDLDIRECT Recent hemoglobin A1c from February 2021 was 8.6%.  Creatinine in February 2022 was 1.2.  Lipid profile is not available for review.  Risk Assessment/Calculations:           Physical Exam:    VS:  BP (!) 170/62   Pulse (!) 101   Resp 20   Ht '6\' 3"'$  (1.905 m)   Wt 238 lb (108 kg)   BMI 29.75 kg/m  Wt Readings from Last 3 Encounters:  04/24/21 238 lb (108 kg)  04/05/20 (!) 214 lb (97.1 kg)  04/04/20 (!) 229 lb (103.9 kg)     GEN: Very muscular upper body, consistent with chronic steroid use; well nourished, well developed in no acute distress HEENT: Normal NECK: No JVD; No carotid bruits LYMPHATICS: No lymphadenopathy CARDIAC: RRR, no murmurs, rubs, gallops RESPIRATORY:  Clear to auscultation without rales, wheezing or rhonchi  ABDOMEN: Soft, non-tender, non-distended MUSCULOSKELETAL: Right below the knee amputation with some ulcerations on the amputation stump, no lower extremity edema on the left side.  2 toes amputated in the left foot.  Thready pulses in the left foot.  Bilateral femoral pulses are present. SKIN: Warm and dry NEUROLOGIC:  Alert and oriented x 3 PSYCHIATRIC:  Normal affect   ASSESSMENT:    1. Shortness of breath   2. Heart failure, type unknown ([place])    PLAN:    In order of problems listed above:  Dyspnea: He appears to be describing symptoms of left heart failure.  This could be due to diastolic dysfunction related to untreated hypertension and LVH, which is evident on his ECG or could be secondary to left  ventricular systolic dysfunction due to ischemic heart disease (with extensive three-vessel coronary calcification on CT).  We will start with an echocardiogram.  Also check BNP and prescribed furosemide and potassium and magnesium supplement. Coronary artery calcifications: If LVEF is low, he should undergo invasive coronary angiography.  Otherwise, with plan to further risk stratify with either coronary CT angiography or Lexiscan Myoview. HTN: Poorly controlled.  He is very skeptical of taking any medications.  Before we prescribe any antihypertensive regimen, would like to estimate his LVEF.  He is taking NSAIDs with some frequency, which can worsen fluid retention. DM: He has little body fat and is very muscular.  He does not look like the typical patient with insulin resistance.  Makes me skeptical that he has type 2 diabetes mellitus.  We will check a C-peptide with the rest of his labs.  He is currently receiving treatment only with insulin.  A firm understanding of whether he has type I or type 2 diabetes mellitus would have significant impact on the use of SGLT2 inhibitors for heart failure since this should be avoided if he has type 1 diabetes.  Decided to forego discussing treatment for dyslipidemia at this point, since he is clearly prejudiced against medications for lipid-lowering. CKD: I think this diagnosis is based on a creatinine of 1.57 recorded in January 2021.  More recently his creatinine was 1.2 in February 2022.  Considering his muscular build and age, the most recent creatinine of 1.2 is probably consistent with normal kidney function. R BKA: I would be very surprised if he never had a vascular evaluation prior to his amputations.  These are not available to [place] if they have ever been performed.  He will need evaluation for PAD, starting with a duplex ultrasound.  We will focus on his heart evaluation first. Complex left kidney cyst: Some abnormalities in left kidney have been present at  least since 2017.  Previous studies described this as 2.3 x 2.2 x 2.5 cm and at some point raised suspicion for renal cell carcinoma.  This is being followed now by Dr. Louis Meckel.  The patient insists that he will not have surgery for it.        Medication Adjustments/Labs and Tests Ordered: Current medicines are reviewed at length with the patient  today.  Concerns regarding medicines are outlined above.  Orders Placed This Encounter  Procedures   Comprehensive metabolic panel   Brain natriuretic peptide   Magnesium   C-peptide   EKG 12-Lead   ECHOCARDIOGRAM COMPLETE   Meds ordered this encounter  Medications   furosemide (LASIX) 40 MG tablet    Sig: Take 1 tablet (40 mg total) by mouth daily.    Dispense:  90 tablet    Refill:  1   potassium chloride SA (KLOR-CON) 20 MEQ tablet    Sig: Take 1 tablet (20 mEq total) by mouth daily.    Dispense:  90 tablet    Refill:  1    Patient Instructions  Medication Instructions:  START Furosemide 40 mg once daily START Potassium 20 mEq once daily  *If you need a refill on your cardiac medications before your next appointment, please call your pharmacy*   Lab Work: Your provider would like for you to return in 2 weeks to have the following labs drawn: CMET, BNP, Magnesium, and C-Peptide. You do not need an appointment for the lab. Once in our office lobby there is a podium where you can sign in and ring the doorbell to alert [place] that you are here. The lab is open from 8:00 am to 4:30 pm; closed for lunch from 12:45pm-1:45pm.  If you have labs (blood work) drawn today and your tests are completely normal, you will receive your results only by: [place] (if you have MyChart) OR A paper copy in the mail If you have any lab test that is abnormal or we need to change your treatment, we will call you to review the results.   Testing/Procedures: Your physician has requested that you have an echocardiogram. Echocardiography is a  painless test that uses sound waves to create images of your heart. It provides your doctor with information about the size and shape of your heart and how well your heart's chambers and valves are working. You may receive an ultrasound enhancing agent through an IV if needed to better visualize your heart during the echo.This procedure takes approximately one hour. There are no restrictions for this procedure. This will take place at the [address], [address].   Follow-Up: At [facility], you and your health needs are our priority.  As part of our continuing mission to provide you with exceptional heart care, we have created designated Provider Care Teams.  These Care Teams include your primary Cardiologist (physician) and Advanced Practice Providers (APPs -  Physician Assistants and Nurse Practitioners) who all work together to provide you with the care you need, when you need it.  We recommend signing up for the patient portal called "MyChart".  Sign up information is provided on this After Visit Summary.  MyChart is used to connect with patients for Virtual Visits (Telemedicine).  Patients are able to view lab/test results, encounter notes, upcoming appointments, etc.  Non-urgent messages can be sent to your provider as well.   To learn more about what you can do with MyChart, go to [email or web address].    Your next appointment:   Follow up in 3-4 weeks with Dr. Sallyanne Kuster   Signed, Sanda Klein, MD  04/29/2021 4:59 PM    [place]

## 2021-04-29 ENCOUNTER — Encounter: Payer: Self-pay | Admitting: Cardiovascular Disease

## 2021-04-29 ENCOUNTER — Telehealth: Payer: Self-pay | Admitting: Cardiovascular Disease

## 2021-04-29 NOTE — Telephone Encounter (Signed)
New Message:   Patient wants to ask Dr C why does he think his stump holding fluid and his good leg is not? He also said when he was leaving, the nurse that escorted him out said she would see about getting him another wheelchair. He wanted to know how that works, he knows that she needs to go through his insurance company.

## 2021-04-29 NOTE — Telephone Encounter (Signed)
Forwarding to Dr. Loletha Grayer for further advisement.  Dr. Sallyanne Kuster I don't see office note to review prior to calling this patient and address concerns.  Can you offer any advisment...Marland KitchenMarland Kitchen

## 2021-04-30 NOTE — Telephone Encounter (Signed)
Pt c/o medication issue:  1. Name of Medication:  furosemide (LASIX) 40 MG tablet  2. How are you currently taking this medication (dosage and times per day)?  As prescribed   3. Are you having a reaction (difficulty breathing--STAT)?   4. What is your medication issue?   Patient is following up. He states he also wants to inform Dr. Sallyanne Kuster that he has a tumor in his left kidney, which causes a lot of pain. He thinks the Furosemide may worsen renal issues. Please return call to discuss.

## 2021-04-30 NOTE — Telephone Encounter (Signed)
Spoke with pt. He states his SOB and stump edema have improved. He is worried about his kidney pain and wonders if his lasix will worsen this. He would like to have his echo done sooner than later if possible.   I advised pt I would attempt to reschedule his echo if there was availability. He is willing to have it performed at the hospital if needed. We will follow up with him later today or tomorrow regarding this. Will also forward to Dr. Sallyanne Kuster for review.

## 2021-05-01 ENCOUNTER — Ambulatory Visit (INDEPENDENT_AMBULATORY_CARE_PROVIDER_SITE_OTHER): Payer: Medicare HMO | Admitting: Orthopedic Surgery

## 2021-05-01 ENCOUNTER — Other Ambulatory Visit: Payer: Self-pay

## 2021-05-01 ENCOUNTER — Ambulatory Visit (HOSPITAL_COMMUNITY): Payer: Medicare HMO | Attending: Cardiovascular Disease

## 2021-05-01 DIAGNOSIS — R0602 Shortness of breath: Secondary | ICD-10-CM | POA: Insufficient documentation

## 2021-05-01 DIAGNOSIS — I509 Heart failure, unspecified: Secondary | ICD-10-CM | POA: Diagnosis not present

## 2021-05-01 DIAGNOSIS — Z89511 Acquired absence of right leg below knee: Secondary | ICD-10-CM | POA: Diagnosis not present

## 2021-05-01 LAB — ECHOCARDIOGRAM COMPLETE
Area-P 1/2: 4.4 cm2
P 1/2 time: 281 msec
S' Lateral: 4.3 cm

## 2021-05-01 NOTE — Telephone Encounter (Signed)
Pt called back to let Dr. Loletha Grayer know his renal doctor will be contacting him regarding his lasix dose. Pt will also have labs drawn today that were ordered earlier this month.

## 2021-05-01 NOTE — Telephone Encounter (Signed)
Left detailed VM with Dr. Lurline Del recommendation. Pt scheduled for his echo today.

## 2021-05-02 ENCOUNTER — Encounter: Payer: Self-pay | Admitting: Orthopedic Surgery

## 2021-05-02 ENCOUNTER — Telehealth: Payer: Self-pay | Admitting: Cardiology

## 2021-05-02 NOTE — Telephone Encounter (Signed)
Pt called back to get results of echo.  I reviewed with him and explained Dr. Loletha Grayer would go into more detail on visit.  Pt appreciative.

## 2021-05-02 NOTE — Progress Notes (Signed)
Office Visit Note   Patient: [name]           Date of Birth: June 09, 1966           MRN: [id number] Visit Date: 05/01/2021              Requested by: No referring provider defined for this encounter. PCP: Patient, No Pcp Per (Inactive)  Chief Complaint  Patient presents with   Right Leg - Follow-up    HX Right BKA       HPI: Patient is a 55 year old gentleman who is seen in follow-up for right transtibial amputation.  Amputation in 2017.  Patient states he is recently been hospitalized for congestive heart failure and has developed increased swelling and blistering over his residual limb.  He has been using a prosthetists that is out of town and he states he has gone through 4 different sockets that currently still do not fit.  Patient states he is currently taking Lasix for his congestive heart failure.  Patient states he has had pain and ulceration with the poor fitting socket and his recent congestive heart failure.  Assessment & Plan: Visit Diagnoses:  1. Right below-knee amputee [facility])     Plan: Patient will need a new manual wheelchair.  A new prescription was provided for a new socket.  Prescription provided for [employer].  Patient is a K4 level amputee he is athletic works out in [employer] runs plays sports.  Patient is an existing right transtibial  amputee.  Patient's current comorbidities are not expected to impact the ability to function with the prescribed prosthesis. Patient verbally communicates a strong desire to use a prosthesis. Patient currently requires mobility aids to ambulate without a prosthesis.  Expects not to use mobility aids with a new prosthesis.  Patient has a K4 level amputee he not only has to walk independently up and down stairs walk on uneven terrain but also is active with sports.     Follow-Up Instructions: Return if symptoms worsen or fail to improve.   Ortho Exam  Patient is alert, oriented, no adenopathy, well-dressed, normal  affect, normal respiratory effort. Examination of the left leg he has pitting edema venous insufficiency there is no cellulitis or drainage his calf is 40 cm in circumference with significant swelling from his congestive heart failure.  Patient was placed in a double extra-large compression stocking he will actually need a extra-large compression stocking.  Examination of the right transtibial amputation there is an bearing ulceration from patient subsiding into the socket.  Patient also has dermatitis over the residual limb from sweating.  There is no cellulitis there is no exposed bone or tendon.  Patient was given a short stump shrinker to wear underneath the silicone liner to protect the leg.  Imaging: No results found. No images are attached to the encounter.  Labs: Lab Results  Component Value Date   HGBA1C 8.6 (A) 10/11/2019   REPTSTATUS 09/16/2019 FINAL 09/13/2019   CULT 60,000 COLONIES/mL VIRIDANS STREPTOCOCCUS (A) 09/13/2019     Lab Results  Component Value Date   ALBUMIN 3.4 (L) 09/13/2019    No results found for: MG No results found for: VD25OH  No results found for: PREALBUMIN CBC EXTENDED Latest Ref Rng & Units 09/13/2019  WBC 4.0 - 10.5 K/uL 8.2  RBC 4.22 - 5.81 MIL/uL 6.10(H)  HGB 13.0 - 17.0 g/dL 17.8(H)  HCT 39.0 - 52.0 % 52.4(H)  PLT 150 - 400 K/uL 244  There is no height or weight on file to calculate BMI.  Orders:  No orders of the defined types were placed in this encounter.  No orders of the defined types were placed in this encounter.    Procedures: No procedures performed  Clinical Data: No additional findings.  ROS:  All other systems negative, except as noted in the HPI. Review of Systems  Objective: Vital Signs: There were no vitals taken for this visit.  Specialty Comments:  No specialty comments available.  PMFS History: Patient Active Problem List   Diagnosis Date Noted   Diabetes mellitus without complication ([place])  0000000   Right below-knee amputee ([place]) 10/30/2019   Dyslipidemia 10/30/2019   Type 2 diabetes mellitus with diabetic polyneuropathy, with long-term current use of insulin ([place]) 10/12/2019   Type 2 diabetes mellitus with stage 3a chronic kidney disease, without long-term current use of insulin ([place]) 10/12/2019   Essential hypertension 09/25/2019   Type 2 diabetes mellitus with hyperglycemia, with long-term current use of insulin ([place]) 09/25/2019   Stage 3a chronic kidney disease ([place]) 09/25/2019   Hematuria 09/25/2019   Acquired complex renal cyst 09/25/2019   Urinary hesitancy 09/25/2019   Prosthesis adjustments 09/25/2019   Need for immunization against influenza 09/25/2019   Need for Tdap vaccination 09/25/2019   Erectile dysfunction 09/25/2019   Healthcare maintenance 09/25/2019   Pulmonary nodules 09/25/2019   Depression with anxiety 09/25/2019   Past Medical History:  Diagnosis Date   Diabetes mellitus without complication ([place])    Hypertension     Family History  Problem Relation Age of Onset   Anxiety disorder Mother    Cancer Father     Past Surgical History:  Procedure Laterality Date   BELOW KNEE LEG AMPUTATION Right    Social History   Occupational History   Not on file  Tobacco Use   Smoking status: Never   Smokeless tobacco: Never  Vaping Use   Vaping Use: Never used  Substance and Sexual Activity   Alcohol use: Never   Drug use: Never   Sexual activity: Yes

## 2021-05-05 ENCOUNTER — Telehealth: Payer: Self-pay | Admitting: Cardiovascular Disease

## 2021-05-05 NOTE — Telephone Encounter (Signed)
Pt aware and will discuss findings at appt on 05/16/21 Study shows slightly less than normal heart pumping strength, and confirms excess fluid build up (diastolic heart failure). His heart needs to be investigated further and either coronary CT or cardiac cath are reasonable. We can discuss at the 05/16/2021 appointment face to face

## 2021-05-05 NOTE — Telephone Encounter (Signed)
Pt is returning call In regards to his results from 05/02/21

## 2021-05-06 DIAGNOSIS — M5136 Other intervertebral disc degeneration, lumbar region: Secondary | ICD-10-CM | POA: Diagnosis not present

## 2021-05-08 ENCOUNTER — Telehealth: Payer: Self-pay

## 2021-05-08 NOTE — Telephone Encounter (Signed)
[place] called and would like to clarify a few facts.  They have only made only one prosthetic for the patient and that was made by [employer].  When  Mr. Abukar comes in the office he said that everything feels amazing with any adjustments made or putting on socks. She stated Pt wants to refuse that he isn't having fluctuation. But in person he states that they are crazy and that he never said that. They also have a history of compliance issues as well as coming to appointments   She stated that pt has been difficult to work with and compliance is playing a factor. She also would like Dr. Sharol Given to know that they are not longer treating this patient.   If you have any question she said feel free to give her a call

## 2021-05-09 ENCOUNTER — Other Ambulatory Visit (HOSPITAL_COMMUNITY): Payer: Medicare HMO

## 2021-05-09 DIAGNOSIS — R0602 Shortness of breath: Secondary | ICD-10-CM | POA: Diagnosis not present

## 2021-05-09 DIAGNOSIS — I509 Heart failure, unspecified: Secondary | ICD-10-CM | POA: Diagnosis not present

## 2021-05-10 ENCOUNTER — Telehealth: Payer: Self-pay | Admitting: Home Health

## 2021-05-10 LAB — COMPREHENSIVE METABOLIC PANEL
ALT: 29 IU/L (ref 0–44)
AST: 39 IU/L (ref 0–40)
Albumin/Globulin Ratio: 1.4 (ref 1.2–2.2)
Albumin: 4 g/dL (ref 3.8–4.9)
Alkaline Phosphatase: 79 IU/L (ref 44–121)
BUN/Creatinine Ratio: 14 (ref 9–20)
BUN: 26 mg/dL — ABNORMAL HIGH (ref 6–24)
Bilirubin Total: 0.5 mg/dL (ref 0.0–1.2)
CO2: 23 mmol/L (ref 20–29)
Calcium: 10.2 mg/dL (ref 8.7–10.2)
Chloride: 98 mmol/L (ref 96–106)
Creatinine, Ser: 1.8 mg/dL — ABNORMAL HIGH (ref 0.76–1.27)
Globulin, Total: 2.9 g/dL (ref 1.5–4.5)
Glucose: 35 mg/dL — CL (ref 65–99)
Potassium: 4.3 mmol/L (ref 3.5–5.2)
Sodium: 144 mmol/L (ref 134–144)
Total Protein: 6.9 g/dL (ref 6.0–8.5)
eGFR: 44 mL/min/{1.73_m2} — ABNORMAL LOW (ref >59)

## 2021-05-10 LAB — BRAIN NATRIURETIC PEPTIDE: BNP: 234.9 pg/mL — ABNORMAL HIGH (ref 0.0–100.0)

## 2021-05-10 LAB — MAGNESIUM: Magnesium: 1.8 mg/dL (ref 1.6–2.3)

## 2021-05-10 LAB — C-PEPTIDE: C-Peptide: 1.9 ng/mL (ref 1.1–4.4)

## 2021-05-10 NOTE — Telephone Encounter (Signed)
Received page from [place], call back at [phone], reports patient had blood work drawn yesterday 05/09/2021 at 12:50 PM, with glucose results 35.  Called patient twice at [phone], voicemail left for patient to call back.   Patient called back, informed patient that his glucose level was 35 yesterday during lab work.  He recalls being diaphoretic, states he knew his sugar was low.  He is feeling well today, although did not eat breakfast and has not checked his sugar.  Patient to consume regular meals, check glucose at least 3 times daily before meals, and bring glucose logs to PCP for evaluation of diabetes.  Patient states he is from [place], has fired his urologist, endocrinologist, PCP.  He states he does not like any of the doctors that he saw, prefers his own way to manage his diabetes, he does not care for carb count, obtain insulin from [place].  He refused to seek new PCP or endocrinologist for his diabetes condition, despite this is recommended medically.  Advised patient to monitor glucose, symptoms of hypoglycemia, had to the nearest ER if experiencing significant distress/symptoms.  Patient laughed at the end.

## 2021-05-13 ENCOUNTER — Other Ambulatory Visit: Payer: Self-pay | Admitting: *Deleted

## 2021-05-13 MED ORDER — FUROSEMIDE 20 MG PO TABS: 20.0000 mg | ORAL_TABLET | Freq: Every day | ORAL | 1 refills | Status: DC

## 2021-05-14 NOTE — Telephone Encounter (Signed)
I called and sw facility and the pt states that Dr. Sharol Given has advised that he not pay for his prosthetic as they have made 4 for the pt ( this is not true) pt declined casting of another limb because he said it was a problem with his sleeve and liner and that it was not the socket its self. The pt then called the office and advised that he never said that and was very verbally abusive to their staff. If the pt had declined to come back to the office they would have d/c him as a patient. They just wanted to make sure that we were aware of the situation and to call with any questions.

## 2021-05-15 ENCOUNTER — Ambulatory Visit: Payer: Medicare HMO | Admitting: Emergency Medicine

## 2021-05-16 ENCOUNTER — Other Ambulatory Visit: Payer: Self-pay

## 2021-05-16 ENCOUNTER — Ambulatory Visit (INDEPENDENT_AMBULATORY_CARE_PROVIDER_SITE_OTHER): Payer: Medicare HMO | Admitting: Cardiovascular Disease

## 2021-05-16 VITALS — BP 126/78 | HR 96 | Ht 75.0 in | Wt 240.0 lb

## 2021-05-16 DIAGNOSIS — E1165 Type 2 diabetes mellitus with hyperglycemia: Secondary | ICD-10-CM

## 2021-05-16 DIAGNOSIS — I5032 Chronic diastolic (congestive) heart failure: Secondary | ICD-10-CM | POA: Diagnosis not present

## 2021-05-16 DIAGNOSIS — E118 Type 2 diabetes mellitus with unspecified complications: Secondary | ICD-10-CM | POA: Diagnosis not present

## 2021-05-16 DIAGNOSIS — Z89511 Acquired absence of right leg below knee: Secondary | ICD-10-CM | POA: Diagnosis not present

## 2021-05-16 DIAGNOSIS — N1831 Chronic kidney disease, stage 3a: Secondary | ICD-10-CM | POA: Diagnosis not present

## 2021-05-16 DIAGNOSIS — R0602 Shortness of breath: Secondary | ICD-10-CM | POA: Diagnosis not present

## 2021-05-16 DIAGNOSIS — IMO0002 Reserved for concepts with insufficient information to code with codable children: Secondary | ICD-10-CM

## 2021-05-16 HISTORY — DX: Shortness of breath: R06.02

## 2021-05-16 MED ORDER — LISINOPRIL 10 MG PO TABS: 10.0000 mg | ORAL_TABLET | Freq: Every day | ORAL | 3 refills | Status: DC

## 2021-05-16 MED ORDER — METOPROLOL TARTRATE 100 MG PO TABS: ORAL_TABLET | ORAL | 0 refills | Status: DC

## 2021-05-16 MED ORDER — FUROSEMIDE 40 MG PO TABS: ORAL_TABLET | ORAL | 3 refills | Status: DC

## 2021-05-16 NOTE — Patient Instructions (Signed)
Medication Instructions:  START Lisinopril 10 mg once daily Change how you take the Furosemide: Take 40 mg once daily for a weight under 240 lbs. For a weight over 240 lbs, take 80 mg (2 tablets)   *If you need a refill on your cardiac medications before your next appointment, please call your pharmacy*  Follow-Up: At [facility], you and your health needs are our priority.  As part of our continuing mission to provide you with exceptional heart care, we have created designated Provider Care Teams.  These Care Teams include your primary Cardiologist (physician) and Advanced Practice Providers (APPs -  Physician Assistants and Nurse Practitioners) who all work together to provide you with the care you need, when you need it.  We recommend signing up for the patient portal called "MyChart".  Sign up information is provided on this After Visit Summary.  MyChart is used to connect with patients for Virtual Visits (Telemedicine).  Patients are able to view lab/test results, encounter notes, upcoming appointments, etc.  Non-urgent messages can be sent to your provider as well.   To learn more about what you can do with MyChart, go to [email or web address].    Your next appointment:   2 month(s)  The format for your next appointment:   In Person  Provider:   You may see Sanda Klein, MD or one of the following Advanced Practice Providers on your designated Care Team:   Almyra Deforest, PA-C Fabian Sharp, [place] or  Roby Lofts, [place]   Other Instructions   Your cardiac CT will be scheduled at one of the below locations:   [facility] [address] [place], [place] [address] [phone]  [place] [address] [place], [place] [address] [phone]  If scheduled at [facility], please arrive at the [facility] main entrance (entrance A) of [facility] 30 minutes prior to test start time. Proceed  to the [facility] Radiology Department (first floor) to check-in and test prep.  If scheduled at [facility], please arrive 15 mins early for check-in and test prep.  Please follow these instructions carefully (unless otherwise directed):  Hold all erectile dysfunction medications at least 3 days (72 hrs) prior to test.  On the Night Before the Test: Be sure to Drink plenty of water. Do not consume any caffeinated/decaffeinated beverages or chocolate 12 hours prior to your test. Do not take any antihistamines 12 hours prior to your test.   On the Day of the Test: Drink plenty of water until 1 hour prior to the test. Do not eat any food 4 hours prior to the test. You may take your regular medications prior to the test.  Take metoprolol (Lopressor) two hours prior to test. HOLD Furosemide morning of the test. Hold the Lisinopril the morning of the test       After the Test: Drink plenty of water. After receiving IV contrast, you may experience a mild flushed feeling. This is normal. On occasion, you may experience a mild rash up to 24 hours after the test. This is not dangerous. If this occurs, you can take Benadryl 25 mg and increase your fluid intake. If you experience trouble breathing, this can be serious. If it is severe call 911 IMMEDIATELY. If it is mild, please call our office. If you take any of these medications: Glipizide/Metformin, Avandament, Glucavance, please do not take 48 hours after completing test unless otherwise instructed.  Please allow 2-4 weeks for scheduling of routine cardiac CTs. Some insurance companies require a pre-authorization which may delay scheduling of this test.   For non-scheduling related questions, please contact the cardiac imaging nurse navigator should you have any questions/concerns: Marchia Bond, Cardiac Imaging Nurse Navigator Gordy Clement, Cardiac Imaging Nurse Navigator [facility] Direct Office Dial: [phone]   For scheduling needs, including cancellations and rescheduling, please call [place], [phone].

## 2021-05-16 NOTE — Progress Notes (Signed)
Cardiology Office Note:    Date:  05/16/2021   ID:  [name], DOB 07-02-1966, MRN [id number]  PCP:  Patient, No Pcp Per (Inactive)   [facility] Providers Cardiologist:  Sanda Klein, MD     Referring MD: No ref. provider found   Chief Complaint  Patient presents with   Follow-up    3-4 weeks.   Shortness of Breath    History of Present Illness:    [name] is a 55 y.o. male with a hx of type 2 diabetes mellitus, essential hypertension, family history of early onset CAD, history of below the knee amputation for gangrene of the right foot (after failed transmetatarsal amputation), referred in consultation by Dr. Lamonte Sakai after chest CT disclosed the presence of multivessel coronary artery calcification and cardiomegaly.  The study was performed for follow-up on multiple pulmonary nodules, which are felt to be likely granulomatous in nature.  He describes symptoms consistent with congestive heart failure: Exertional dyspnea and swelling in his amputation stump.  His BNP was elevated at 235.  His echocardiogram showed borderline LV function with an EF of 50-55%, but there was echo evidence of elevated left atrial filling pressures, the left atrium was severely dilated, there were no meaningful valvular abnormalities.  He improved after treatment with diuretics.  Creatinine increased from 1.57-1.810 we tried decreasing the dose of furosemide, but his shortness of breath and swelling in his prosthetic stump have again increased.  He continues to deny any problems or chest pain without rest or with activity.  Denies palpitations, dizziness, syncope or focal neurological complaints.  Noncontrast contrast chest CT performed 04/09/2021 (for follow-up of pulmonary nodules) showed cardiomegaly, three-vessel coronary artery disease and coarsened nodular liver contour suggestive of cirrhosis  [name] is originally from [place] and much of his medical history is not available for  direct review.  He had a longstanding history of diabetes mellitus (since 2006, age 61) and feels confident that it was diagnosed as being type 2 diabetes.  In 2015 he had gangrene and reported osteomyelitis of the right foot and underwent a transmetatarsal amputation that did not heal and eventually was followed by a right below the knee amputation in 2017.  He is very upset that may be the amputation was not necessary since he was told he had osteomyelitis and the final pathology report did not confirm osteomyelitis.  He also has 2 toes amputated on the left.  He does not recall ever having an evaluation for arterial insufficiency of the lower extremities.  He is confident he has never had invasive angiography.  Has used steroids for bodybuilding for many years to the point that he developed testicular atrophy and hypoandrogenism and is now receiving testosterone supplementation.  His diabetes is being managed with insulin alone.  He does not want to take metformin which he believes is very toxic and is skeptical of any other oral antidiabetics and skeptical of medications in general.  His father had bypass surgery at age 79 and later had to undergo redo bypass surgery.  [name] makes it clear that he will "never undergo major surgery".  He is being monitored for a mass in his right kidney by his urologist, Dr. Louis Meckel that is suspected to represent renal cell carcinoma.  He does not want to have surgery for this either.   Past Medical History:  Diagnosis Date   Diabetes mellitus without complication ([place])    Hypertension     Past Surgical History:  Procedure Laterality  Date   BELOW KNEE LEG AMPUTATION Right     Current Medications: Current Meds  Medication Sig   insulin NPH Human (NOVOLIN N) 100 UNIT/ML injection Inject 0.15 mLs (15 Units total) into the skin at bedtime.   lisinopril (ZESTRIL) 10 MG tablet Take 1 tablet (10 mg total) by mouth daily.   metoprolol tartrate (LOPRESSOR) 100 MG  tablet Take one tablet two hours before the test   Oxycodone HCl 10 MG TABS Take 10 mg by mouth once as needed.   potassium chloride SA (KLOR-CON) 20 MEQ tablet Take 1 tablet (20 mEq total) by mouth daily.   sildenafil (VIAGRA) 50 MG tablet Take 1 tablet (50 mg total) by mouth daily as needed for erectile dysfunction.   [DISCONTINUED] furosemide (LASIX) 20 MG tablet Take 1 tablet (20 mg total) by mouth daily. (Patient taking differently: Take 40 mg by mouth daily.)   [DISCONTINUED] Naproxen Sodium 220 MG CAPS Aleve     Allergies:   Bactrim [sulfamethoxazole-trimethoprim], Ceprotin [protein c concentrate (human)], Ciprofloxacin, and Levaquin [levofloxacin]   Social History   Socioeconomic History   Marital status: Divorced    Spouse name: Not on file   Number of children: Not on file   Years of education: Not on file   Highest education level: Not on file  Occupational History   Not on file  Tobacco Use   Smoking status: Never   Smokeless tobacco: Never  Vaping Use   Vaping Use: Never used  Substance and Sexual Activity   Alcohol use: Never   Drug use: Never   Sexual activity: Yes  Other Topics Concern   Not on file  Social History Narrative   Not on file   Social Determinants of Health   Financial Resource Strain: Not on file  Food Insecurity: Not on file  Transportation Needs: Not on file  Physical Activity: Not on file  Stress: Not on file  Social Connections: Not on file     Family History: The patient's family history includes Anxiety disorder in his mother; Cancer in his father.  Bypass surgery and redo bypass surgery in his father age 55  ROS:   Please see the history of present illness.     All other systems reviewed and are negative.  EKGs/Labs/Other Studies Reviewed:    The following studies were reviewed today: CT of the chest August 2022  EKG:  EKG is not ordered today.  The ekg ordered at his previous appointment demonstrates sinus tachycardia 101  bpm, left atrial abnormality, left ventricular hypertrophy with repolarization changes, QS pattern in lead V1-V2, T wave inversion leads V4-V6, 1, and aVL.  QTc prolonged at 484 ms  Recent Labs: 05/09/2021: ALT 29; BNP 234.9; BUN 26; Creatinine, Ser 1.80; Magnesium 1.8; Potassium 4.3; Sodium 144  Recent Lipid Panel No results found for: CHOL, TRIG, HDL, CHOLHDL, VLDL, LDLCALC, LDLDIRECT Recent hemoglobin A1c from February 2021 was 8.6%.  Creatinine in February 2022 was 1.2.  Lipid profile is not available for review.  Risk Assessment/Calculations:           Physical Exam:    VS:  BP 126/78 (BP Location: Right Arm, Patient Position: Sitting, Cuff Size: Large)   Pulse 96   Ht '6\' 3"'$  (1.905 m)   Wt 240 lb (108.9 kg)   BMI 30.00 kg/m     Wt Readings from Last 3 Encounters:  05/16/21 240 lb (108.9 kg)  04/24/21 238 lb (108 kg)  04/05/20 (!) 214 lb (97.1 kg)  General: Alert, oriented x3, no distress Head: no evidence of trauma, PERRL, EOMI, no exophtalmos or lid lag, no myxedema, no xanthelasma; normal ears, nose and oropharynx Neck: 6-8 cm elevation in jugular venous pulsations and no hepatojugular reflux; brisk carotid pulses without delay and no carotid bruits Chest: clear to auscultation, no signs of consolidation by percussion or palpation, normal fremitus, symmetrical and full respiratory excursions Cardiovascular: normal position and quality of the apical impulse, regular rhythm, normal first and second heart sounds, no murmurs, rubs or gallops Abdomen: no tenderness or distention, no masses by palpation, no abnormal pulsatility or arterial bruits, normal bowel sounds, no hepatosplenomegaly Extremities: s/p right BKA; no clubbing, cyanosis; trivial left pretibial edema; 2+ radial, ulnar and brachial pulses bilaterally; 2+ right femoral, posterior tibial and dorsalis pedis pulses; 2+ left femoral, posterior tibial and dorsalis pedis pulses; no subclavian or femoral  bruits Neurological: grossly nonfocal Psych: Normal mood and affect   ASSESSMENT:    1. Shortness of breath   2. Chronic diastolic (congestive) heart failure ([place])   3. Diabetes mellitus type 2, uncontrolled, with complications (HCC)   4. Stage 3a chronic kidney disease ([place])   5. Hx of BKA, right ([place])     PLAN:    In order of problems listed above:  CHF: Elevated BNP and pseudo normal mitral inflow and echocardiogram both confirmed that his shortness of breath was due to acutely decompensated diastolic heart failure.  He also has a severely dilated left atrium.  First we need to exclude significant coronary artery disease.  He once again pointed out today that he will not undergo bypass surgery under any circumstances, although he will consider stents.  He saw his father suffer with slow recovery after bypass surgery and does not want to go through that.  We will start with a coronary CTA.  Seems to do best with a weight of 237-238 pounds as his "dry weight".  We will have him take furosemide 80 mg daily if his weight is over 240 pounds, 40 mg daily if his weight is 240 pounds or less. HTN: Has improved just with diuretics.  We will add lisinopril, low-dose (he took this medicine in the past and is agreeable to trying it again).  Hold on the day of his CT angiogram DM: He has a lot of misgivings about many medications, including metformin.  C-peptide confirms that he has type 2 diabetes mellitus, despite the fact that he is very muscular and lean.  I think he is an ideal candidate for an SGLT2 inhibitor such as [place] or [place].  We will wait until we have the coronary data before discussing this with him again. CKD: Creatinine increased with diuretic therapy, but when we tried to reduce the dose of furosemide he developed swelling and shortness of breath again. R BKA: Surprisingly, it appears he never had a vascular evaluation before he underwent the amputation.  Once we are done with  his cardiac work-up we will focus on lower extremity arterial ultrasound. Complex left kidney cyst: Some abnormalities in left kidney have been present at least since 2017.  Previous studies described this as 2.3 x 2.2 x 2.5 cm and at some point raised suspicion for renal cell carcinoma.  This is being followed now by Dr. Louis Meckel.  The patient insists that he will not have surgery for it.        Medication Adjustments/Labs and Tests Ordered: Current medicines are reviewed at length with the patient today.  Concerns regarding  medicines are outlined above.  Orders Placed This Encounter  Procedures   CT CORONARY MORPH W/CTA COR W/SCORE W/CA W/CM &/OR WO/CM    Meds ordered this encounter  Medications   furosemide (LASIX) 40 MG tablet    Sig: Take 40 mg once daily for a weight under 240 lbs. For a weight over 240 lbs, take 80 mg (2 tablets)    Dispense:  45 tablet    Refill:  3   metoprolol tartrate (LOPRESSOR) 100 MG tablet    Sig: Take one tablet two hours before the test    Dispense:  1 tablet    Refill:  0   lisinopril (ZESTRIL) 10 MG tablet    Sig: Take 1 tablet (10 mg total) by mouth daily.    Dispense:  90 tablet    Refill:  3     Patient Instructions  Medication Instructions:  START Lisinopril 10 mg once daily Change how you take the Furosemide: Take 40 mg once daily for a weight under 240 lbs. For a weight over 240 lbs, take 80 mg (2 tablets)   *If you need a refill on your cardiac medications before your next appointment, please call your pharmacy*  Follow-Up: At [facility], you and your health needs are our priority.  As part of our continuing mission to provide you with exceptional heart care, we have created designated Provider Care Teams.  These Care Teams include your primary Cardiologist (physician) and Advanced Practice Providers (APPs -  Physician Assistants and Nurse Practitioners) who all work together to provide you with the care you need, when you need  it.  We recommend signing up for the patient portal called "MyChart".  Sign up information is provided on this After Visit Summary.  MyChart is used to connect with patients for Virtual Visits (Telemedicine).  Patients are able to view lab/test results, encounter notes, upcoming appointments, etc.  Non-urgent messages can be sent to your provider as well.   To learn more about what you can do with MyChart, go to [email or web address].    Your next appointment:   2 month(s)  The format for your next appointment:   In Person  Provider:   You may see Sanda Klein, MD or one of the following Advanced Practice Providers on your designated Care Team:   Almyra Deforest, PA-C Fabian Sharp, [place] or  Roby Lofts, [place]   Other Instructions   Your cardiac CT will be scheduled at one of the below locations:   [facility] [address] [place], [place] [address] [phone]  [place] [address] [place], [place] [address] [phone]  If scheduled at [facility], please arrive at the [facility] main entrance (entrance A) of [facility] 30 minutes prior to test start time. Proceed to the [facility] Radiology Department (first floor) to check-in and test prep.  If scheduled at [facility], please arrive 15 mins early for check-in and test prep.  Please follow these instructions carefully (unless otherwise directed):  Hold all erectile dysfunction medications at least 3 days (72 hrs) prior to test.  On the Night Before the Test: Be sure to Drink plenty of water. Do not consume any caffeinated/decaffeinated beverages or chocolate 12 hours prior to your test. Do not take any antihistamines 12 hours prior to your test.   On the Day of the Test: Drink plenty of water until 1 hour prior to the  test. Do not eat any food 4 hours prior to the test. You may take your regular  medications prior to the test.  Take metoprolol (Lopressor) two hours prior to test. HOLD Furosemide morning of the test. Hold the Lisinopril the morning of the test       After the Test: Drink plenty of water. After receiving IV contrast, you may experience a mild flushed feeling. This is normal. On occasion, you may experience a mild rash up to 24 hours after the test. This is not dangerous. If this occurs, you can take Benadryl 25 mg and increase your fluid intake. If you experience trouble breathing, this can be serious. If it is severe call 911 IMMEDIATELY. If it is mild, please call our office. If you take any of these medications: Glipizide/Metformin, Avandament, Glucavance, please do not take 48 hours after completing test unless otherwise instructed.  Please allow 2-4 weeks for scheduling of routine cardiac CTs. Some insurance companies require a pre-authorization which may delay scheduling of this test.   For non-scheduling related questions, please contact the cardiac imaging nurse navigator should you have any questions/concerns: Marchia Bond, Cardiac Imaging Nurse Navigator Gordy Clement, Cardiac Imaging Nurse Navigator [facility] Direct Office Dial: [phone]   For scheduling needs, including cancellations and rescheduling, please call [place], [phone].    Signed, Sanda Klein, MD  05/16/2021 2:09 PM    [place]

## 2021-05-21 ENCOUNTER — Telehealth: Payer: Self-pay | Admitting: Cardiovascular Disease

## 2021-05-21 NOTE — Telephone Encounter (Signed)
Called patient left message on personal voice mail Dr.Croitoru's advice.

## 2021-05-21 NOTE — Telephone Encounter (Signed)
Spoke to patient he wanted to ask Dr.Croitoru if ok to take Mega Fish Oil 1000 mg daily.He wanted to know if he recommended a different brand.Advised I will send message to Dr.Croitoru for advice.

## 2021-05-21 NOTE — Telephone Encounter (Signed)
Left message to call back  

## 2021-05-21 NOTE — Telephone Encounter (Signed)
  Pt c/o medication issue:  1. Name of Medication:  Mega3 fish oil '1000mg'$   2. How are you currently taking this medication (dosage and times per day)? 1 tablet a day  3. Are you having a reaction (difficulty breathing--STAT)? no  4. What is your medication issue? Patient wanted to know if it would be okay for him to take. Please advise

## 2021-05-21 NOTE — Telephone Encounter (Signed)
Pt is returning call.  

## 2021-05-22 ENCOUNTER — Telehealth (HOSPITAL_COMMUNITY): Payer: Self-pay | Admitting: *Deleted

## 2021-05-22 NOTE — Telephone Encounter (Signed)
Patient returning call regarding upcoming cardiac imaging study; pt verbalizes understanding of appt date/time, parking situation and where to check in, pre-test NPO status and medications ordered; name and call back number provided for further questions should they arise  Gordy Clement RN Navigator Cardiac Imaging [facility] [phone] office [phone] cell  Patient to take '100mg'$  metoprolol tartrate two hours prior to cardiac CT.

## 2021-05-22 NOTE — Telephone Encounter (Signed)
Attempted to call patient regarding upcoming cardiac CT appointment. °Left message on voicemail with name and callback number ° °Bryen Hinderman RN Navigator Cardiac Imaging °[facility] °[phone] Office °[phone] Cell ° °

## 2021-05-23 ENCOUNTER — Ambulatory Visit (HOSPITAL_COMMUNITY)
Admission: RE | Admit: 2021-05-23 | Discharge: 2021-05-23 | Disposition: A | Discharge status: Home / Self Care | Payer: Medicare HMO | Source: Ambulatory Visit | Attending: Cardiovascular Disease | Admitting: Cardiovascular Disease

## 2021-05-23 ENCOUNTER — Ambulatory Visit (HOSPITAL_COMMUNITY): Admission: RE | Admit: 2021-05-23 | Payer: Medicare HMO | Source: Ambulatory Visit

## 2021-05-23 ENCOUNTER — Other Ambulatory Visit: Payer: Self-pay

## 2021-05-23 DIAGNOSIS — I5032 Chronic diastolic (congestive) heart failure: Secondary | ICD-10-CM | POA: Diagnosis not present

## 2021-05-23 DIAGNOSIS — I2510 Atherosclerotic heart disease of native coronary artery without angina pectoris: Secondary | ICD-10-CM | POA: Diagnosis not present

## 2021-05-23 DIAGNOSIS — R0602 Shortness of breath: Secondary | ICD-10-CM | POA: Diagnosis not present

## 2021-05-23 DIAGNOSIS — I700 Atherosclerosis of aorta: Secondary | ICD-10-CM | POA: Diagnosis not present

## 2021-05-23 IMAGING — CT CT HEART MORP W/ CTA COR W/ SCORE W/ CA W/CM &/OR W/O CM
4 of 7 series · 8 of 20 positions shown, 9 images · IV contrast (APPLIED)
Comparison: CT chest [DATE]

Addendum:
CLINICAL DATA: 55 year old with chest pain.

EXAM:
Cardiac/Coronary  CTA
TECHNIQUE: The patient was scanned on a [employer] scanner.

[Series 6: best diast 72 % · axial · 0.39mm/px · z∈[+1737,+1792]mm · 2 of 412 slices shown]
[im 138/412  vessel]
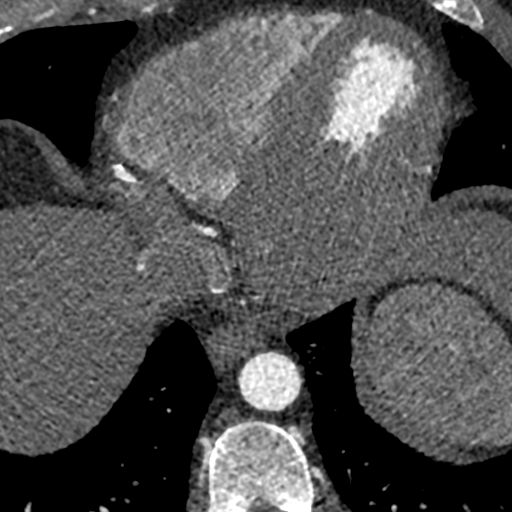
[im 275/412  vessel]
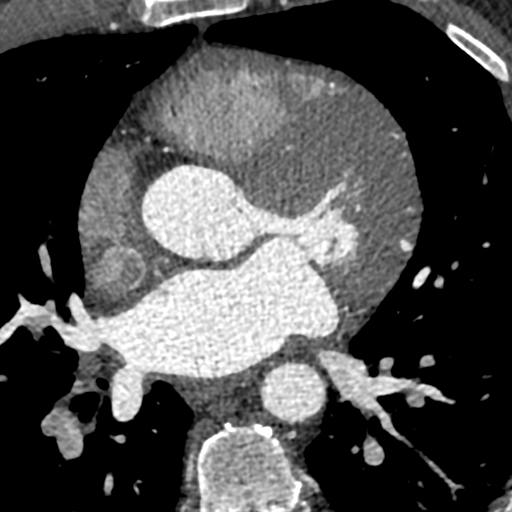

[Series 10: best syst · axial · 0.39mm/px · z∈[+1737,+1792]mm · 2 of 412 slices shown, 3 images]
[im 138/412  vessel]
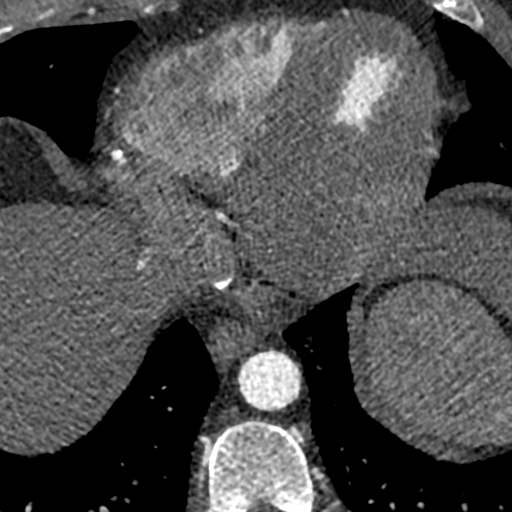
[im 138/412  lung]
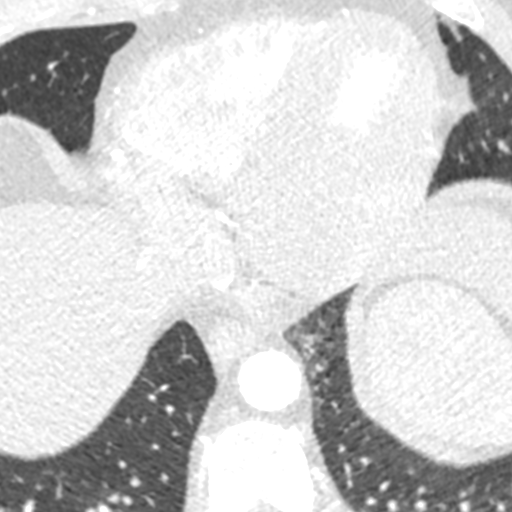
[im 275/412  vessel]
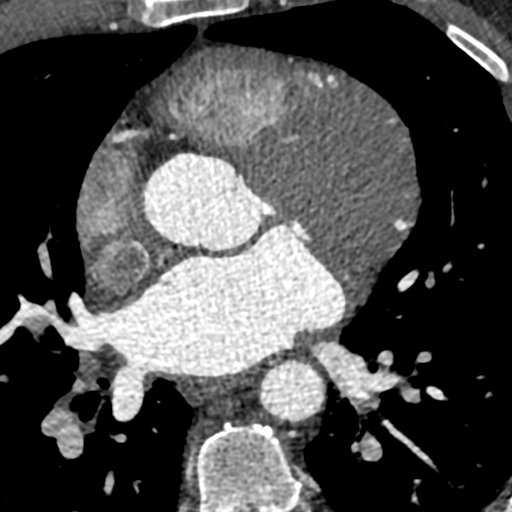

[Series 11: ts syst sharp · axial · 0.39mm/px · z∈[+1737,+1792]mm · 2 of 412 slices shown]
[im 138/412  lung]
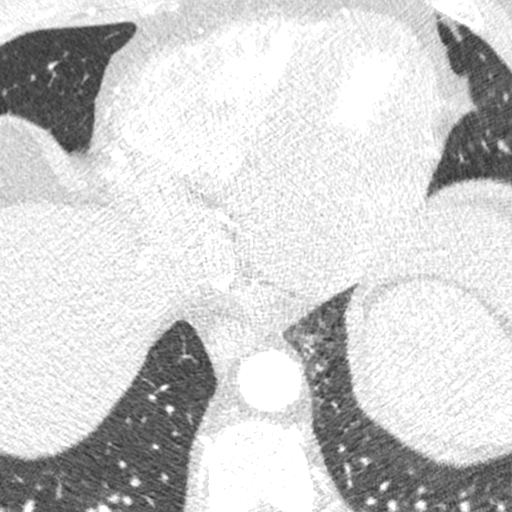
[im 275/412  lung]
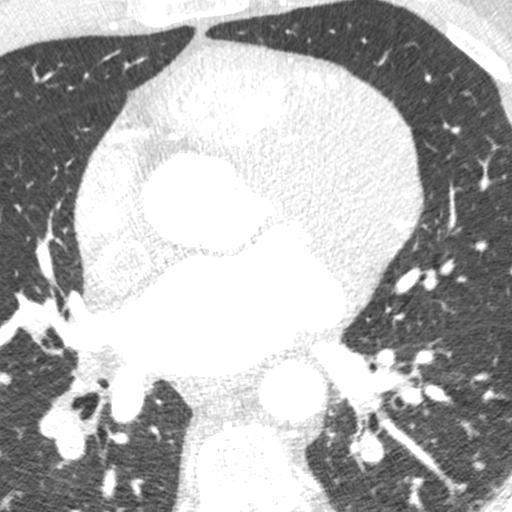

[Series 13: ts diast sharp 72 % · axial · 0.39mm/px · z∈[+1737,+1792]mm · 2 of 412 slices shown]
[im 138/412  lung]
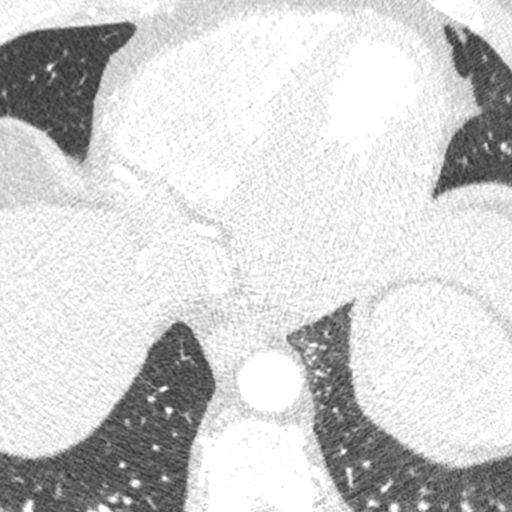
[im 275/412  lung]
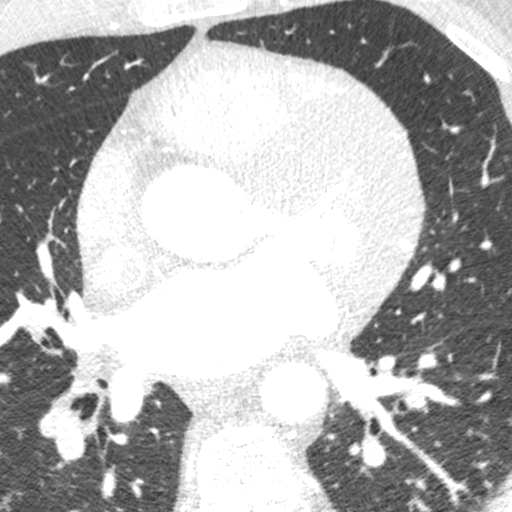

[8 of 20 positions shown; findings below may reference images not displayed]

FINDINGS: A 100 kV prospective scan was triggered in the descending thoracic
aorta at 111 HU's. Axial non-contrast 3 mm slices were carried out
through the heart. The data set was analyzed on a dedicated work
station and scored using the Agatson method. Gantry rotation speed
was 250 msecs and collimation was .6 mm. 0.8 mg of sl NTG was given.
The 3D data set was reconstructed in 5% intervals of the 67-82 % of
the R-R cycle. Diastolic phases were analyzed on a dedicated work
station using MPR, MIP and VRT modes. The patient received 80 cc of
contrast.

Aorta:  Normal size.  Minor calcifications.  No dissection.

Aortic Valve:  Trileaflet.  No calcifications.

Coronary Arteries:  Normal coronary origin.  Right dominance.

RCA is a large dominant artery that gives rise to PDA and PLA. There
is diffuse calcified plaque with distal RCA 50% (FFR was normal).

Left main is a large artery that gives rise to LAD and LCX arteries.

LAD is a large vessel that has diffuse spotty calcification, 0-24%
stenosis. There is a small caliber first diagonal with 70-99%
proximal stenosis. FFR of diagonal was abnormal,) 0.64. Vessel
likely too small for PCI.

LCX is a non-dominant artery that gives rise to one large OM1
branch. There is no plaque.

Calcium score

LM: 0

LAD: 98

LCX: 72

RCA: 73

Total 243

Percentile 90

Other findings:

Normal pulmonary vein drainage into the left atrium.

Normal left atrial appendage without a thrombus.

Normal size of the pulmonary artery.

Small PFO

Please see radiology report for non cardiac findings.
IMPRESSION: 1. Coronary calcium score of 243. This was 90 percentile for age and
sex matched control.

2. Normal coronary origin with right dominance.

3. Diffuse calcified plaque of LAD, RCA. There is significant
stenosis of mid first diagonal branch, appears too small for PCI
(FFR 0.64).

4.  Aortic atherosclerosis.

EXAM:
OVER-READ INTERPRETATION  CT CHEST

The following report is an over-read performed by radiologist Dr.
over-read does not include interpretation of cardiac or coronary
anatomy or pathology. The coronary CTA interpretation by the
cardiologist is attached.
FINDINGS: No mediastinal adenopathy or mass identified.

No pleural effusions identified. No airspace consolidation,
atelectasis, or pneumothorax. Small pulmonary nodules are identified
within the left lower lobe and right middle lobe. These measure up
to 5 mm and are unchanged from [DATE].

No acute findings within the imaged portions of the upper abdomen.

Remote right posterolateral rib fractures. Degenerative disc disease
identified within the thoracic spine.
IMPRESSION: 1. No significant noncardiac supplemental findings identified.
2. Small pulmonary nodules within the left lower lobe and right
middle lobe are unchanged from [DATE]. These are compatible with
a benign process require no further follow-up.

*** End of Addendum ***
FINDINGS: A 100 kV prospective scan was triggered in the descending thoracic
aorta at 111 HU's. Axial non-contrast 3 mm slices were carried out
through the heart. The data set was analyzed on a dedicated work
station and scored using the Agatson method. Gantry rotation speed
was 250 msecs and collimation was .6 mm. 0.8 mg of sl NTG was given.
The 3D data set was reconstructed in 5% intervals of the 67-82 % of
the R-R cycle. Diastolic phases were analyzed on a dedicated work
station using MPR, MIP and VRT modes. The patient received 80 cc of
contrast.

Aorta:  Normal size.  Minor calcifications.  No dissection.

Aortic Valve:  Trileaflet.  No calcifications.

Coronary Arteries:  Normal coronary origin.  Right dominance.

RCA is a large dominant artery that gives rise to PDA and PLA. There
is diffuse calcified plaque with distal RCA 50% (FFR was normal).

Left main is a large artery that gives rise to LAD and LCX arteries.

LAD is a large vessel that has diffuse spotty calcification, 0-24%
stenosis. There is a small caliber first diagonal with 70-99%
proximal stenosis. FFR of diagonal was abnormal,) 0.64. Vessel
likely too small for PCI.

LCX is a non-dominant artery that gives rise to one large OM1
branch. There is no plaque.

Calcium score

LM: 0

LAD: 98

LCX: 72

RCA: 73

Total 243

Percentile 90

Other findings:

Normal pulmonary vein drainage into the left atrium.

Normal left atrial appendage without a thrombus.

Normal size of the pulmonary artery.

Small PFO

Please see radiology report for non cardiac findings.
IMPRESSION: 1. Coronary calcium score of 243. This was 90 percentile for age and
sex matched control.

2. Normal coronary origin with right dominance.

3. Diffuse calcified plaque of LAD, RCA. There is significant
stenosis of mid first diagonal branch, appears too small for PCI
(FFR 0.64).

4.  Aortic atherosclerosis.

## 2021-05-23 MED ORDER — DILTIAZEM HCL 25 MG/5ML IV SOLN
10.0000 mg | INTRAVENOUS | Status: AC | PRN
Start: 2021-05-23 — End: 2021-05-23
  Administered 2021-05-23: 10 mg via INTRAVENOUS

## 2021-05-23 MED ORDER — NITROGLYCERIN 0.4 MG SL SUBL
SUBLINGUAL_TABLET | SUBLINGUAL | Status: AC
  Filled 2021-05-23: qty 2

## 2021-05-23 MED ORDER — METOPROLOL TARTRATE 5 MG/5ML IV SOLN
INTRAVENOUS | Status: AC
  Administered 2021-05-23: 10 mg via INTRAVENOUS
  Filled 2021-05-23: qty 20

## 2021-05-23 MED ORDER — NITROGLYCERIN 0.4 MG SL SUBL
0.8000 mg | SUBLINGUAL_TABLET | Freq: Once | SUBLINGUAL | Status: AC
  Administered 2021-05-23: 0.8 mg via SUBLINGUAL

## 2021-05-23 MED ORDER — METOPROLOL TARTRATE 5 MG/5ML IV SOLN
10.0000 mg | INTRAVENOUS | Status: DC | PRN
  Administered 2021-05-23: 10 mg via INTRAVENOUS

## 2021-05-23 MED ORDER — DILTIAZEM HCL 25 MG/5ML IV SOLN
INTRAVENOUS | Status: AC
  Administered 2021-05-23: 10 mg via INTRAVENOUS
  Filled 2021-05-23: qty 5

## 2021-05-23 MED ORDER — IOHEXOL 350 MG/ML SOLN
100.0000 mL | Freq: Once | INTRAVENOUS | Status: AC | PRN
  Administered 2021-05-23: 100 mL via INTRAVENOUS

## 2021-05-23 MED ORDER — SODIUM CHLORIDE 0.9 % IV BOLUS
250.0000 mL | Freq: Once | INTRAVENOUS | Status: AC
  Administered 2021-05-23: 250 mL via INTRAVENOUS

## 2021-05-24 ENCOUNTER — Other Ambulatory Visit (HOSPITAL_COMMUNITY): Payer: Self-pay | Admitting: Emergency Medicine

## 2021-05-24 DIAGNOSIS — R931 Abnormal findings on diagnostic imaging of heart and coronary circulation: Secondary | ICD-10-CM

## 2021-05-24 DIAGNOSIS — R079 Chest pain, unspecified: Secondary | ICD-10-CM

## 2021-05-26 ENCOUNTER — Ambulatory Visit (HOSPITAL_COMMUNITY)
Admission: RE | Admit: 2021-05-26 | Discharge: 2021-05-26 | Disposition: A | Discharge status: Home / Self Care | Payer: Medicare HMO | Source: Ambulatory Visit | Attending: Cardiology | Admitting: Cardiology

## 2021-05-26 DIAGNOSIS — R931 Abnormal findings on diagnostic imaging of heart and coronary circulation: Secondary | ICD-10-CM | POA: Diagnosis not present

## 2021-05-26 DIAGNOSIS — I700 Atherosclerosis of aorta: Secondary | ICD-10-CM | POA: Diagnosis not present

## 2021-05-26 DIAGNOSIS — I2510 Atherosclerotic heart disease of native coronary artery without angina pectoris: Secondary | ICD-10-CM | POA: Diagnosis not present

## 2021-06-03 ENCOUNTER — Ambulatory Visit: Payer: Medicare HMO | Admitting: Cardiology

## 2021-07-02 DIAGNOSIS — M5136 Other intervertebral disc degeneration, lumbar region: Secondary | ICD-10-CM | POA: Diagnosis not present

## 2021-07-21 ENCOUNTER — Inpatient Hospital Stay (HOSPITAL_COMMUNITY)
Admission: EM | Admit: 2021-07-21 | Discharge: 2021-09-10 | DRG: 853 | Disposition: A | Discharge status: Skilled Nursing Facility | Payer: Medicare HMO | Attending: Internal Medicine | Admitting: Internal Medicine

## 2021-07-21 ENCOUNTER — Other Ambulatory Visit: Payer: Self-pay

## 2021-07-21 ENCOUNTER — Emergency Department (HOSPITAL_COMMUNITY): Payer: Medicare HMO

## 2021-07-21 ENCOUNTER — Encounter (HOSPITAL_COMMUNITY): Payer: Self-pay | Admitting: Emergency Medicine

## 2021-07-21 DIAGNOSIS — R8271 Bacteriuria: Secondary | ICD-10-CM | POA: Diagnosis present

## 2021-07-21 DIAGNOSIS — Z794 Long term (current) use of insulin: Secondary | ICD-10-CM

## 2021-07-21 DIAGNOSIS — E1110 Type 2 diabetes mellitus with ketoacidosis without coma: Secondary | ICD-10-CM | POA: Diagnosis present

## 2021-07-21 DIAGNOSIS — M19072 Primary osteoarthritis, left ankle and foot: Secondary | ICD-10-CM | POA: Diagnosis not present

## 2021-07-21 DIAGNOSIS — I5042 Chronic combined systolic (congestive) and diastolic (congestive) heart failure: Secondary | ICD-10-CM | POA: Diagnosis present

## 2021-07-21 DIAGNOSIS — E1169 Type 2 diabetes mellitus with other specified complication: Secondary | ICD-10-CM | POA: Diagnosis present

## 2021-07-21 DIAGNOSIS — R339 Retention of urine, unspecified: Secondary | ICD-10-CM | POA: Diagnosis present

## 2021-07-21 DIAGNOSIS — L97529 Non-pressure chronic ulcer of other part of left foot with unspecified severity: Secondary | ICD-10-CM | POA: Diagnosis not present

## 2021-07-21 DIAGNOSIS — M4644 Discitis, unspecified, thoracic region: Secondary | ICD-10-CM | POA: Diagnosis not present

## 2021-07-21 DIAGNOSIS — I4892 Unspecified atrial flutter: Secondary | ICD-10-CM | POA: Diagnosis not present

## 2021-07-21 DIAGNOSIS — A480 Gas gangrene: Secondary | ICD-10-CM | POA: Diagnosis not present

## 2021-07-21 DIAGNOSIS — I130 Hypertensive heart and chronic kidney disease with heart failure and stage 1 through stage 4 chronic kidney disease, or unspecified chronic kidney disease: Secondary | ICD-10-CM | POA: Diagnosis present

## 2021-07-21 DIAGNOSIS — R6521 Severe sepsis with septic shock: Secondary | ICD-10-CM | POA: Diagnosis present

## 2021-07-21 DIAGNOSIS — I519 Heart disease, unspecified: Secondary | ICD-10-CM | POA: Diagnosis not present

## 2021-07-21 DIAGNOSIS — E11628 Type 2 diabetes mellitus with other skin complications: Secondary | ICD-10-CM | POA: Diagnosis not present

## 2021-07-21 DIAGNOSIS — R9431 Abnormal electrocardiogram [ECG] [EKG]: Secondary | ICD-10-CM | POA: Diagnosis not present

## 2021-07-21 DIAGNOSIS — R0600 Dyspnea, unspecified: Secondary | ICD-10-CM

## 2021-07-21 DIAGNOSIS — M86172 Other acute osteomyelitis, left ankle and foot: Secondary | ICD-10-CM | POA: Diagnosis present

## 2021-07-21 DIAGNOSIS — A419 Sepsis, unspecified organism: Secondary | ICD-10-CM | POA: Diagnosis not present

## 2021-07-21 DIAGNOSIS — M4624 Osteomyelitis of vertebra, thoracic region: Secondary | ICD-10-CM | POA: Diagnosis present

## 2021-07-21 DIAGNOSIS — R7881 Bacteremia: Secondary | ICD-10-CM | POA: Diagnosis not present

## 2021-07-21 DIAGNOSIS — D179 Benign lipomatous neoplasm, unspecified: Secondary | ICD-10-CM | POA: Diagnosis not present

## 2021-07-21 DIAGNOSIS — Z20822 Contact with and (suspected) exposure to covid-19: Secondary | ICD-10-CM | POA: Diagnosis present

## 2021-07-21 DIAGNOSIS — M545 Low back pain, unspecified: Secondary | ICD-10-CM | POA: Diagnosis not present

## 2021-07-21 DIAGNOSIS — I10 Essential (primary) hypertension: Secondary | ICD-10-CM | POA: Diagnosis not present

## 2021-07-21 DIAGNOSIS — R0602 Shortness of breath: Secondary | ICD-10-CM | POA: Diagnosis not present

## 2021-07-21 DIAGNOSIS — Z89511 Acquired absence of right leg below knee: Secondary | ICD-10-CM | POA: Diagnosis not present

## 2021-07-21 DIAGNOSIS — N179 Acute kidney failure, unspecified: Secondary | ICD-10-CM | POA: Diagnosis not present

## 2021-07-21 DIAGNOSIS — E1152 Type 2 diabetes mellitus with diabetic peripheral angiopathy with gangrene: Secondary | ICD-10-CM | POA: Diagnosis present

## 2021-07-21 DIAGNOSIS — E1122 Type 2 diabetes mellitus with diabetic chronic kidney disease: Secondary | ICD-10-CM | POA: Diagnosis present

## 2021-07-21 DIAGNOSIS — L97509 Non-pressure chronic ulcer of other part of unspecified foot with unspecified severity: Secondary | ICD-10-CM | POA: Diagnosis not present

## 2021-07-21 DIAGNOSIS — I4891 Unspecified atrial fibrillation: Secondary | ICD-10-CM | POA: Diagnosis present

## 2021-07-21 DIAGNOSIS — E785 Hyperlipidemia, unspecified: Secondary | ICD-10-CM | POA: Diagnosis not present

## 2021-07-21 DIAGNOSIS — L8990 Pressure ulcer of unspecified site, unspecified stage: Secondary | ICD-10-CM | POA: Insufficient documentation

## 2021-07-21 DIAGNOSIS — N170 Acute kidney failure with tubular necrosis: Secondary | ICD-10-CM | POA: Diagnosis present

## 2021-07-21 DIAGNOSIS — R6520 Severe sepsis without septic shock: Secondary | ICD-10-CM

## 2021-07-21 DIAGNOSIS — E1142 Type 2 diabetes mellitus with diabetic polyneuropathy: Secondary | ICD-10-CM | POA: Diagnosis not present

## 2021-07-21 DIAGNOSIS — M7989 Other specified soft tissue disorders: Secondary | ICD-10-CM | POA: Diagnosis not present

## 2021-07-21 DIAGNOSIS — G061 Intraspinal abscess and granuloma: Secondary | ICD-10-CM | POA: Diagnosis present

## 2021-07-21 DIAGNOSIS — B951 Streptococcus, group B, as the cause of diseases classified elsewhere: Secondary | ICD-10-CM | POA: Diagnosis not present

## 2021-07-21 DIAGNOSIS — Z79899 Other long term (current) drug therapy: Secondary | ICD-10-CM

## 2021-07-21 DIAGNOSIS — N1831 Chronic kidney disease, stage 3a: Secondary | ICD-10-CM | POA: Diagnosis not present

## 2021-07-21 DIAGNOSIS — Z91128 Patient's intentional underdosing of medication regimen for other reason: Secondary | ICD-10-CM

## 2021-07-21 DIAGNOSIS — Z9889 Other specified postprocedural states: Secondary | ICD-10-CM | POA: Diagnosis not present

## 2021-07-21 DIAGNOSIS — M726 Necrotizing fasciitis: Secondary | ICD-10-CM | POA: Diagnosis present

## 2021-07-21 DIAGNOSIS — L03116 Cellulitis of left lower limb: Secondary | ICD-10-CM | POA: Diagnosis present

## 2021-07-21 DIAGNOSIS — J90 Pleural effusion, not elsewhere classified: Secondary | ICD-10-CM | POA: Diagnosis not present

## 2021-07-21 DIAGNOSIS — G4700 Insomnia, unspecified: Secondary | ICD-10-CM | POA: Diagnosis present

## 2021-07-21 DIAGNOSIS — B9561 Methicillin susceptible Staphylococcus aureus infection as the cause of diseases classified elsewhere: Secondary | ICD-10-CM

## 2021-07-21 DIAGNOSIS — E871 Hypo-osmolality and hyponatremia: Secondary | ICD-10-CM | POA: Diagnosis present

## 2021-07-21 DIAGNOSIS — L089 Local infection of the skin and subcutaneous tissue, unspecified: Secondary | ICD-10-CM | POA: Diagnosis present

## 2021-07-21 DIAGNOSIS — Z7401 Bed confinement status: Secondary | ICD-10-CM | POA: Diagnosis not present

## 2021-07-21 DIAGNOSIS — M546 Pain in thoracic spine: Secondary | ICD-10-CM | POA: Diagnosis not present

## 2021-07-21 DIAGNOSIS — G8918 Other acute postprocedural pain: Secondary | ICD-10-CM | POA: Diagnosis not present

## 2021-07-21 DIAGNOSIS — E11621 Type 2 diabetes mellitus with foot ulcer: Secondary | ICD-10-CM | POA: Diagnosis present

## 2021-07-21 DIAGNOSIS — T383X6A Underdosing of insulin and oral hypoglycemic [antidiabetic] drugs, initial encounter: Secondary | ICD-10-CM | POA: Diagnosis present

## 2021-07-21 DIAGNOSIS — D696 Thrombocytopenia, unspecified: Secondary | ICD-10-CM | POA: Diagnosis present

## 2021-07-21 DIAGNOSIS — Z4781 Encounter for orthopedic aftercare following surgical amputation: Secondary | ICD-10-CM | POA: Diagnosis not present

## 2021-07-21 DIAGNOSIS — I517 Cardiomegaly: Secondary | ICD-10-CM | POA: Diagnosis not present

## 2021-07-21 DIAGNOSIS — E1042 Type 1 diabetes mellitus with diabetic polyneuropathy: Secondary | ICD-10-CM

## 2021-07-21 DIAGNOSIS — M2578 Osteophyte, vertebrae: Secondary | ICD-10-CM | POA: Diagnosis not present

## 2021-07-21 DIAGNOSIS — M4807 Spinal stenosis, lumbosacral region: Secondary | ICD-10-CM | POA: Diagnosis not present

## 2021-07-21 DIAGNOSIS — M5137 Other intervertebral disc degeneration, lumbosacral region: Secondary | ICD-10-CM | POA: Diagnosis not present

## 2021-07-21 DIAGNOSIS — J869 Pyothorax without fistula: Secondary | ICD-10-CM

## 2021-07-21 DIAGNOSIS — L89300 Pressure ulcer of unspecified buttock, unstageable: Secondary | ICD-10-CM | POA: Diagnosis not present

## 2021-07-21 DIAGNOSIS — L89152 Pressure ulcer of sacral region, stage 2: Secondary | ICD-10-CM | POA: Diagnosis present

## 2021-07-21 DIAGNOSIS — E43 Unspecified severe protein-calorie malnutrition: Secondary | ICD-10-CM | POA: Diagnosis present

## 2021-07-21 DIAGNOSIS — Z89512 Acquired absence of left leg below knee: Secondary | ICD-10-CM | POA: Diagnosis present

## 2021-07-21 DIAGNOSIS — G894 Chronic pain syndrome: Secondary | ICD-10-CM | POA: Diagnosis present

## 2021-07-21 DIAGNOSIS — I5032 Chronic diastolic (congestive) heart failure: Secondary | ICD-10-CM | POA: Diagnosis present

## 2021-07-21 DIAGNOSIS — R911 Solitary pulmonary nodule: Secondary | ICD-10-CM | POA: Diagnosis not present

## 2021-07-21 DIAGNOSIS — L89150 Pressure ulcer of sacral region, unstageable: Secondary | ICD-10-CM | POA: Diagnosis not present

## 2021-07-21 DIAGNOSIS — R590 Localized enlarged lymph nodes: Secondary | ICD-10-CM | POA: Diagnosis present

## 2021-07-21 DIAGNOSIS — E1165 Type 2 diabetes mellitus with hyperglycemia: Secondary | ICD-10-CM | POA: Diagnosis not present

## 2021-07-21 DIAGNOSIS — I5020 Unspecified systolic (congestive) heart failure: Secondary | ICD-10-CM | POA: Diagnosis not present

## 2021-07-21 DIAGNOSIS — I083 Combined rheumatic disorders of mitral, aortic and tricuspid valves: Secondary | ICD-10-CM | POA: Diagnosis not present

## 2021-07-21 DIAGNOSIS — J9811 Atelectasis: Secondary | ICD-10-CM | POA: Diagnosis not present

## 2021-07-21 DIAGNOSIS — J984 Other disorders of lung: Secondary | ICD-10-CM | POA: Diagnosis not present

## 2021-07-21 DIAGNOSIS — M6281 Muscle weakness (generalized): Secondary | ICD-10-CM | POA: Diagnosis not present

## 2021-07-21 DIAGNOSIS — F418 Other specified anxiety disorders: Secondary | ICD-10-CM | POA: Diagnosis not present

## 2021-07-21 DIAGNOSIS — A401 Sepsis due to streptococcus, group B: Principal | ICD-10-CM | POA: Diagnosis present

## 2021-07-21 DIAGNOSIS — Z23 Encounter for immunization: Secondary | ICD-10-CM | POA: Diagnosis present

## 2021-07-21 DIAGNOSIS — R079 Chest pain, unspecified: Secondary | ICD-10-CM | POA: Diagnosis present

## 2021-07-21 DIAGNOSIS — I700 Atherosclerosis of aorta: Secondary | ICD-10-CM | POA: Diagnosis not present

## 2021-07-21 DIAGNOSIS — M4804 Spinal stenosis, thoracic region: Secondary | ICD-10-CM | POA: Diagnosis not present

## 2021-07-21 DIAGNOSIS — M542 Cervicalgia: Secondary | ICD-10-CM | POA: Diagnosis not present

## 2021-07-21 DIAGNOSIS — M5134 Other intervertebral disc degeneration, thoracic region: Secondary | ICD-10-CM | POA: Diagnosis not present

## 2021-07-21 DIAGNOSIS — Z8249 Family history of ischemic heart disease and other diseases of the circulatory system: Secondary | ICD-10-CM

## 2021-07-21 HISTORY — DX: Chronic diastolic (congestive) heart failure: I50.32

## 2021-07-21 LAB — COMPREHENSIVE METABOLIC PANEL
ALT: 40 U/L (ref 0–44)
AST: 28 U/L (ref 15–41)
Albumin: 2 g/dL — ABNORMAL LOW (ref 3.5–5.0)
Alkaline Phosphatase: 242 U/L — ABNORMAL HIGH (ref 38–126)
Anion gap: 18 — ABNORMAL HIGH (ref 5–15)
BUN: 40 mg/dL — ABNORMAL HIGH (ref 6–20)
CO2: 18 mmol/L — ABNORMAL LOW (ref 22–32)
Calcium: 8.6 mg/dL — ABNORMAL LOW (ref 8.9–10.3)
Chloride: 87 mmol/L — ABNORMAL LOW (ref 98–111)
Creatinine, Ser: 2.76 mg/dL — ABNORMAL HIGH (ref 0.61–1.24)
GFR, Estimated: 26 mL/min — ABNORMAL LOW (ref >60)
Glucose, Bld: 516 mg/dL (ref 70–99)
Potassium: 4.7 mmol/L (ref 3.5–5.1)
Sodium: 123 mmol/L — ABNORMAL LOW (ref 135–145)
Total Bilirubin: 1 mg/dL (ref 0.3–1.2)
Total Protein: 5.6 g/dL — ABNORMAL LOW (ref 6.5–8.1)

## 2021-07-21 LAB — CBC WITH DIFFERENTIAL/PLATELET
Abs Immature Granulocytes: 0.3 10*3/uL — ABNORMAL HIGH (ref 0.00–0.07)
Basophils Absolute: 0 10*3/uL (ref 0.0–0.1)
Basophils Relative: 0 %
Eosinophils Absolute: 0 10*3/uL (ref 0.0–0.5)
Eosinophils Relative: 0 %
HCT: 53.2 % — ABNORMAL HIGH (ref 39.0–52.0)
Hemoglobin: 17.9 g/dL — ABNORMAL HIGH (ref 13.0–17.0)
Lymphocytes Relative: 1 %
Lymphs Abs: 0.1 10*3/uL — ABNORMAL LOW (ref 0.7–4.0)
MCH: 28.4 pg (ref 26.0–34.0)
MCHC: 33.6 g/dL (ref 30.0–36.0)
MCV: 84.4 fL (ref 80.0–100.0)
Metamyelocytes Relative: 2 %
Monocytes Absolute: 0.4 10*3/uL (ref 0.1–1.0)
Monocytes Relative: 3 %
Neutro Abs: 12.2 10*3/uL — ABNORMAL HIGH (ref 1.7–7.7)
Neutrophils Relative %: 94 %
Platelets: 141 10*3/uL — ABNORMAL LOW (ref 150–400)
RBC: 6.3 MIL/uL — ABNORMAL HIGH (ref 4.22–5.81)
RDW: 14.6 % (ref 11.5–15.5)
WBC: 13 10*3/uL — ABNORMAL HIGH (ref 4.0–10.5)
nRBC: 0 % (ref 0.0–0.2)
nRBC: 0 /100 WBC

## 2021-07-21 LAB — RESP PANEL BY RT-PCR (FLU A&B, COVID) ARPGX2
Influenza A by PCR: NEGATIVE
Influenza B by PCR: NEGATIVE
SARS Coronavirus 2 by RT PCR: NEGATIVE

## 2021-07-21 LAB — APTT: aPTT: 24 seconds (ref 24–36)

## 2021-07-21 LAB — PROTIME-INR
INR: 1.4 — ABNORMAL HIGH (ref 0.8–1.2)
Prothrombin Time: 16.8 seconds — ABNORMAL HIGH (ref 11.4–15.2)

## 2021-07-21 LAB — LACTIC ACID, PLASMA
Lactic Acid, Venous: 4 mmol/L (ref 0.5–1.9)
Lactic Acid, Venous: 2.9 mmol/L (ref 0.5–1.9)

## 2021-07-21 LAB — C-REACTIVE PROTEIN: CRP: 23.2 mg/dL — ABNORMAL HIGH (ref <1.0)

## 2021-07-21 LAB — BETA-HYDROXYBUTYRIC ACID: Beta-Hydroxybutyric Acid: 2.53 mmol/L — ABNORMAL HIGH (ref 0.05–0.27)

## 2021-07-21 IMAGING — DX DG TOE GREAT 2+V*L*
1 series · 3 of 3 positions shown · non-contrast
Comparison: None.

CLINICAL DATA: Left great toe pain, osteomyelitis

EXAM:
LEFT GREAT TOE

[Series 1: toe · 0.14mm/px · 3 of 3 slices shown]
[im 1/3]
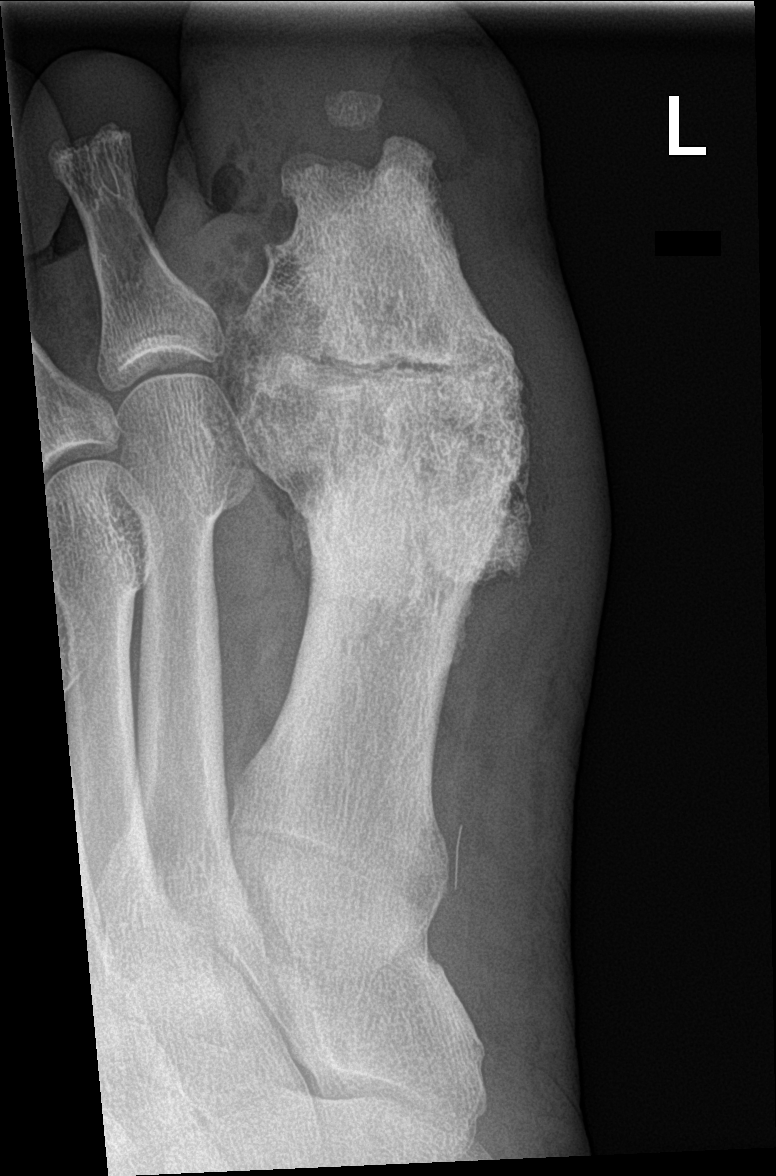
[im 2/3]
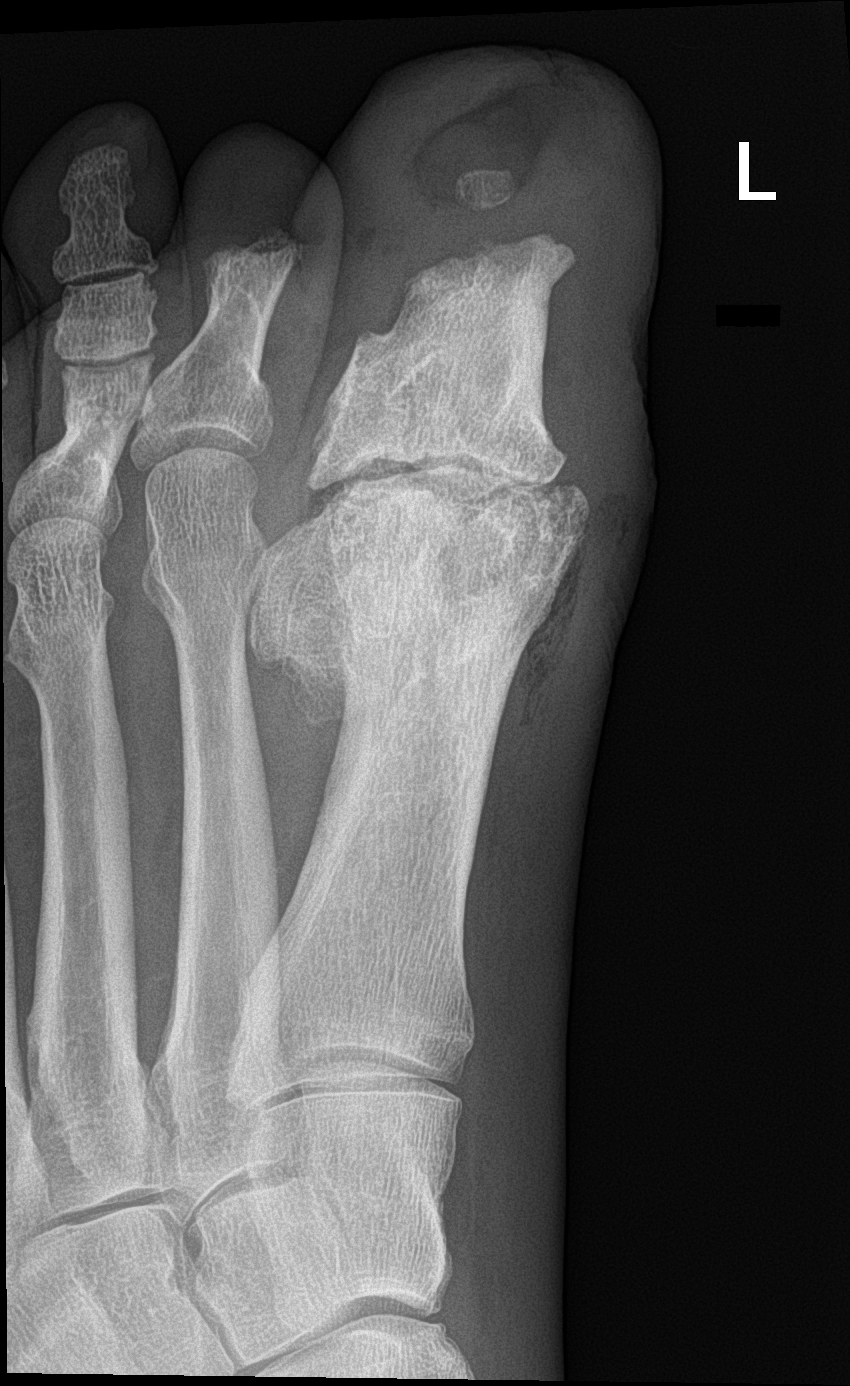
[im 3/3]
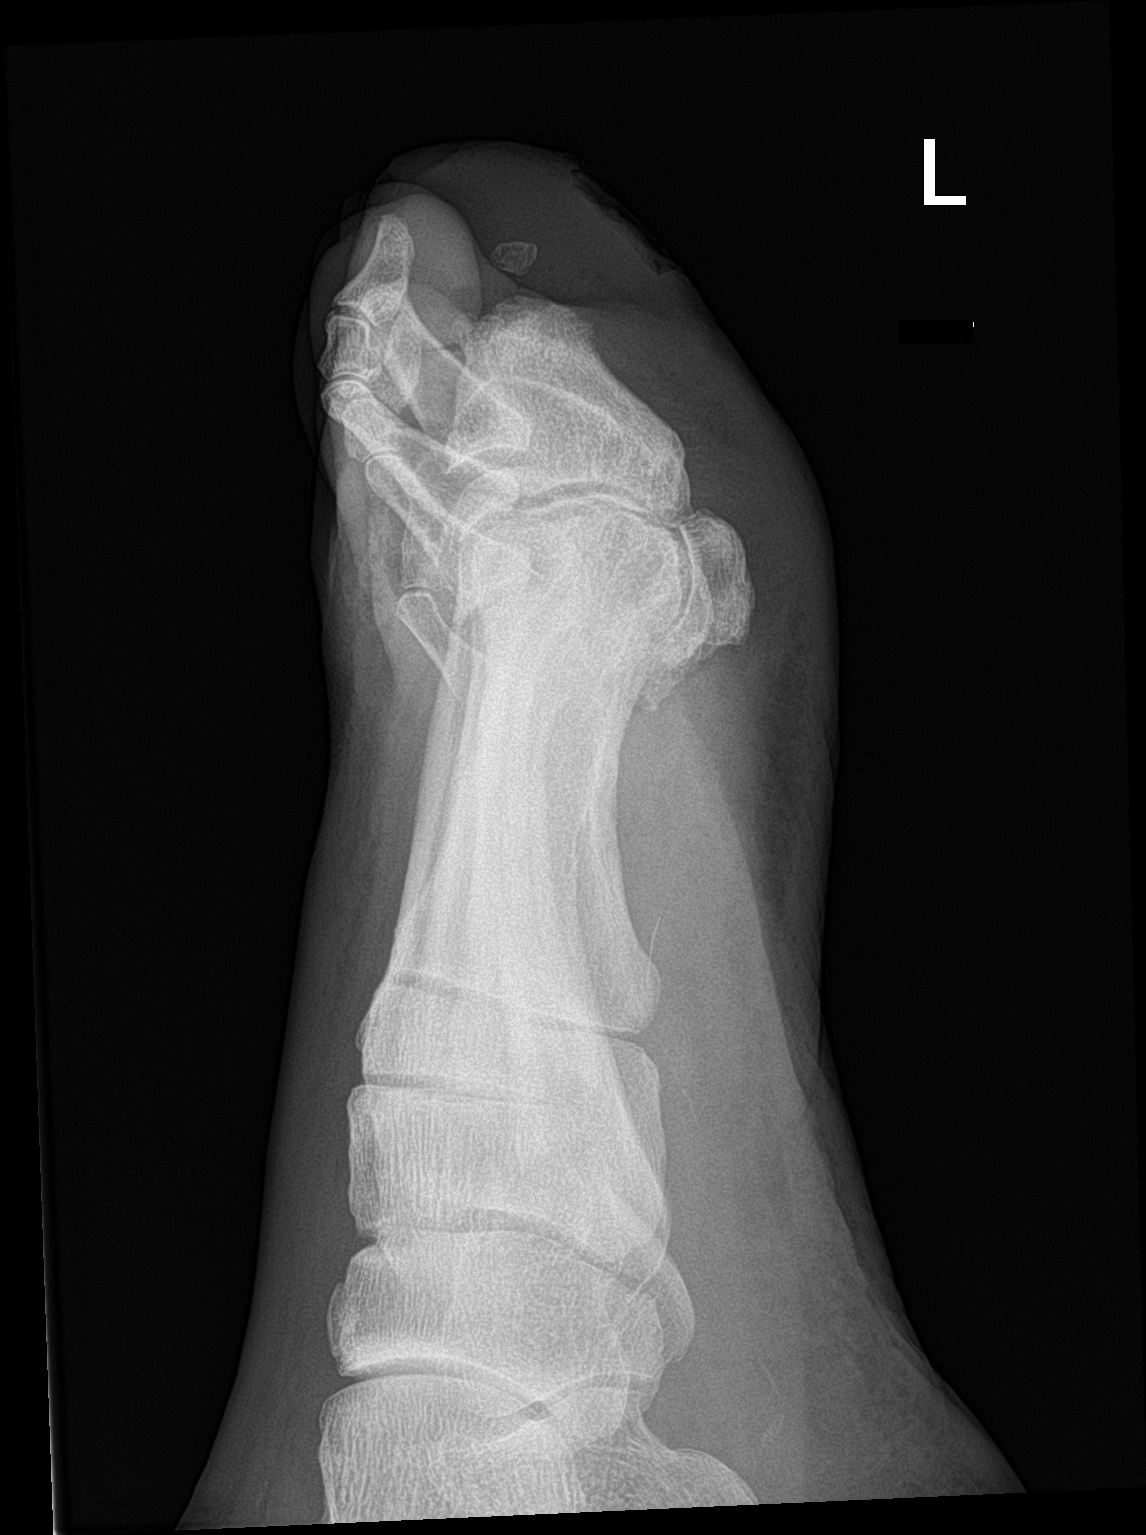

[3 of 3 positions shown; findings below may reference images not displayed]

FINDINGS: Three view radiograph left great toe demonstrates resection of the
distal phalanx of the second digit and probable trans phalangeal
amputation proximal phalanx of the great toe. There is a soft tissue
ulcer involving the distal, medial, plantar aspect the residual left
greatq toe. There is subcutaneous gas within the a dorsal lateral
soft tissues of the left great toe. There are periarticular
lucencies within the a distal phalanx and distal aspect of the first
metatarsal medially suspicious for changes of osteomyelitis/septic
arthritis. Superimposed moderate degenerative arthritis of the first
MTP joint.
IMPRESSION: Postsurgical changes as described above.

Subcutaneous gas within the dorsal lateral soft tissues at the level
of the first MTP joint with subjacent periarticular lucency within
the a base of the first proximal phalanx and distal aspect of the
first metatarsal suspicious for changes of septic
arthritis/osteomyelitis. This could be confirmed with contrast
enhanced MRI examination.

## 2021-07-21 IMAGING — DX DG CHEST 1V PORT
1 series · 1 of 1 positions shown · non-contrast
Comparison: None.

CLINICAL DATA: 55-year-old male with pain.

EXAM:
PORTABLE CHEST 1 VIEW

[chest]
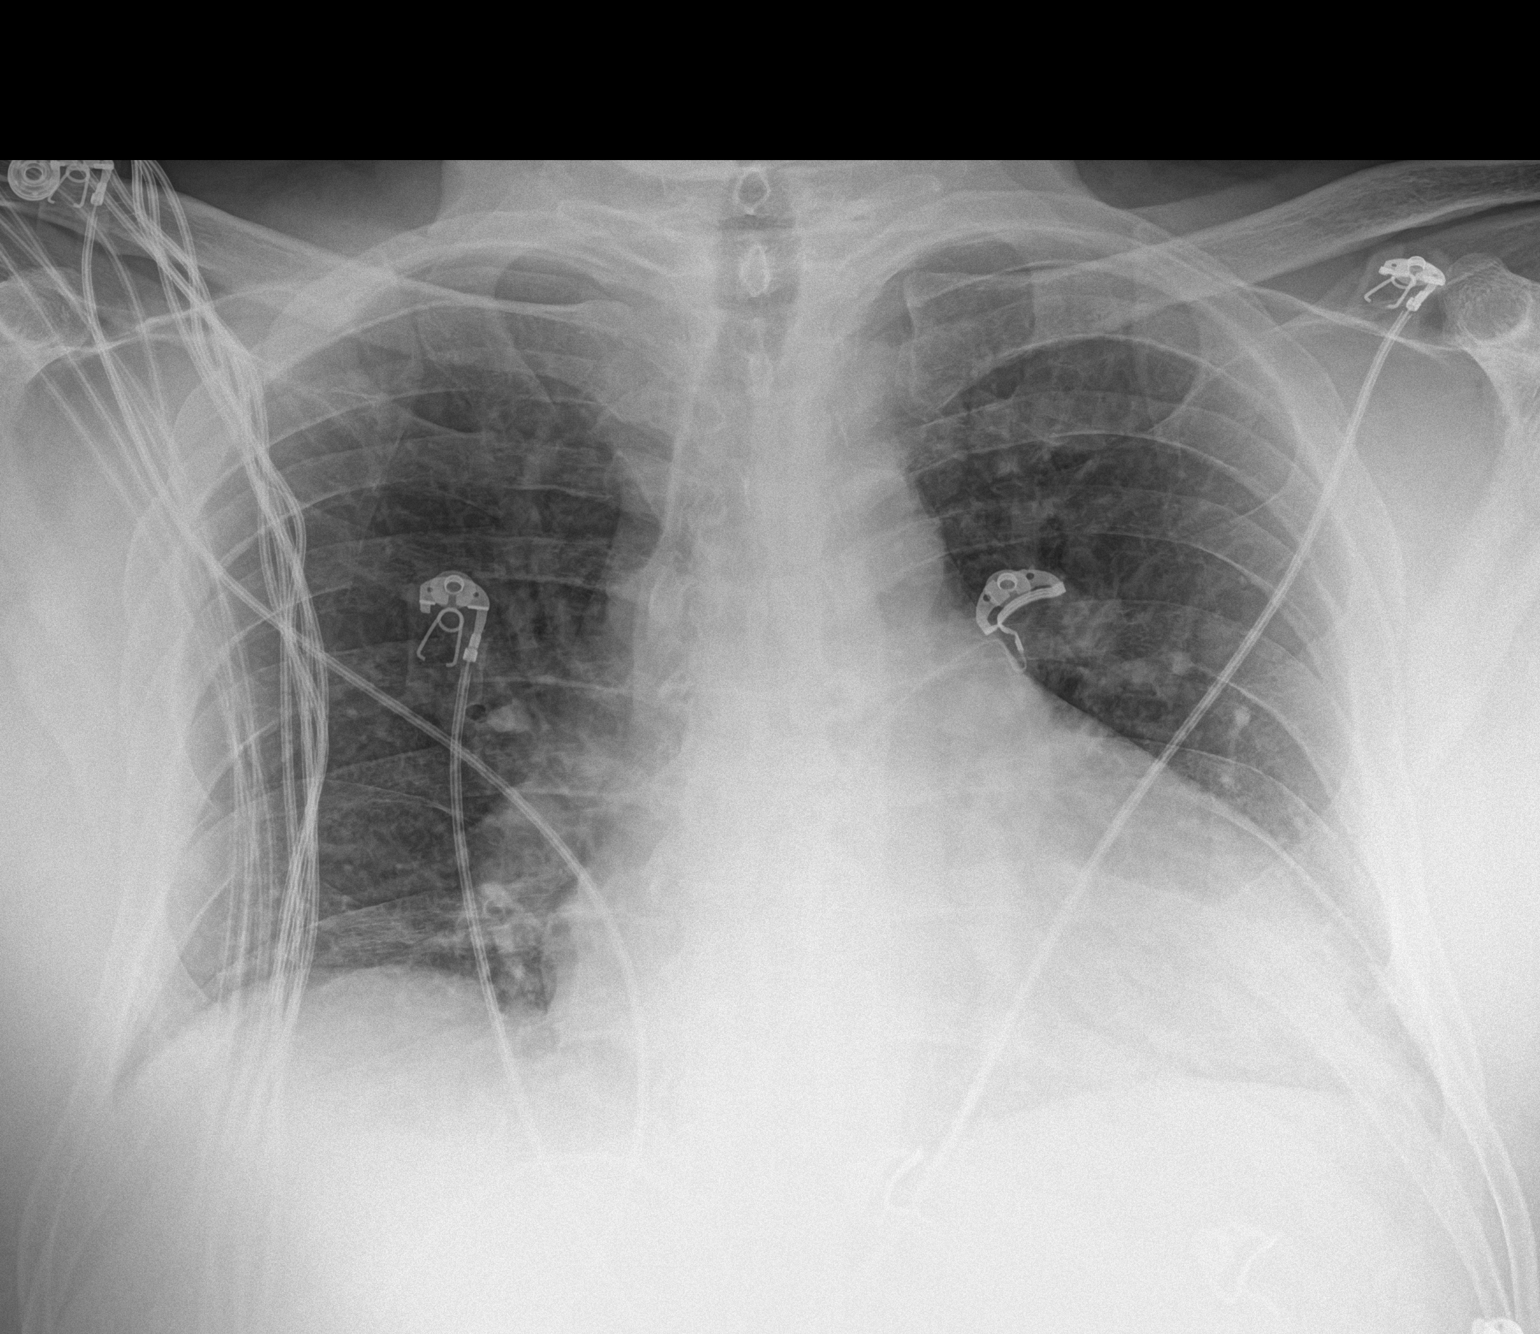

[1 of 1 positions shown; findings below may reference images not displayed]

FINDINGS: UPPER limits normal heart size noted.

Equivocal LEFT LOWER lobe retrocardiac density/atelectasis may be
present.

There is no other evidence of focal airspace disease, pulmonary
edema, suspicious pulmonary nodule/mass, pleural effusion, or
pneumothorax.

No acute bony abnormalities are identified.
IMPRESSION: 1. Equivocal LEFT LOWER lobe retrocardiac density/atelectasis.
Recommend LATERAL chest radiograph if there is clinical suspicion
for pneumonia.

## 2021-07-21 MED ORDER — VANCOMYCIN HCL 2000 MG/400ML IV SOLN
2000.0000 mg | Freq: Once | INTRAVENOUS | Status: AC
  Administered 2021-07-21: 2000 mg via INTRAVENOUS
  Filled 2021-07-21: qty 400

## 2021-07-21 MED ORDER — VANCOMYCIN VARIABLE DOSE PER UNSTABLE RENAL FUNCTION (PHARMACIST DOSING): Status: DC

## 2021-07-21 MED ORDER — LACTATED RINGERS IV BOLUS (SEPSIS)
1000.0000 mL | Freq: Once | INTRAVENOUS | Status: AC
  Administered 2021-07-21: 1000 mL via INTRAVENOUS

## 2021-07-21 MED ORDER — OXYCODONE HCL 5 MG PO TABS
10.0000 mg | ORAL_TABLET | Freq: Once | ORAL | Status: AC
  Administered 2021-07-21: 10 mg via ORAL
  Filled 2021-07-21: qty 2

## 2021-07-21 MED ORDER — LACTATED RINGERS IV SOLN: INTRAVENOUS | Status: DC

## 2021-07-21 MED ORDER — SODIUM CHLORIDE 0.9 % IV SOLN
2.0000 g | Freq: Once | INTRAVENOUS | Status: AC
  Administered 2021-07-21: 2 g via INTRAVENOUS
  Filled 2021-07-21: qty 20

## 2021-07-21 MED ORDER — LACTATED RINGERS IV BOLUS (SEPSIS)
500.0000 mL | Freq: Once | INTRAVENOUS | Status: AC
  Administered 2021-07-21: 500 mL via INTRAVENOUS

## 2021-07-21 NOTE — Sepsis Progress Note (Signed)
Monitoring for the code sepsis protocol. °

## 2021-07-21 NOTE — ED Triage Notes (Signed)
Patient here with infected left foot.  Patient is breathing 30 times a minute.  Patient states that the infection has been going on a few days.  Patient is a diabetic with CHF.  Patient is a bka on the right.

## 2021-07-21 NOTE — Sepsis Progress Note (Signed)
Secure chat sent to bedside ED RN asking for current weight to be placed in Epic.  Weight entered is greater than 7 days.

## 2021-07-21 NOTE — Progress Notes (Signed)
Pharmacy Antibiotic Note Keveon Amsler is a 55 y.o. male admitted on 07/21/2021 with  cellulitis . On presentation, WBC 13.0 and Scr 2.76 in AKI. In ED, given ceftriaxone 2g x 1. Pharmacy has been consulted for vancomycin dosing.  Plan: Vancomycin 2000mg  x 1 load  F/u for vancomycin re-dosing and levels as needed  Temp (24hrs), Avg:97.5 F (36.4 C), Min:97.5 F (36.4 C), Max:97.5 F (36.4 C)  No results for input(s): WBC, CREATININE, LATICACIDVEN, VANCOTROUGH, VANCOPEAK, VANCORANDOM, GENTTROUGH, GENTPEAK, GENTRANDOM, TOBRATROUGH, TOBRAPEAK, TOBRARND, AMIKACINPEAK, AMIKACINTROU, AMIKACIN in the last 168 hours.  CrCl cannot be calculated (Patient's most recent lab result is older than the maximum 21 days allowed.).     Antimicrobials this admission: 11/14 vancomycin >> 11/14 ceftriaxone >>  Microbiology results: 11/14 BCx: sent 11/14 UCx: sent  Thank you for allowing pharmacy to participate in this patient's care.  Levonne Spiller, PharmD PGY1 Acute Care Resident  07/21/2021,11:34 PM

## 2021-07-21 NOTE — ED Provider Notes (Signed)
[facility] EMERGENCY DEPARTMENT Provider Note   CSN: [id number] Arrival date & time: 07/21/21  1746     History Chief Complaint  Patient presents with   Code Sepsis    [name] is a 55 y.o. male.  HPI  55 year old male with PMH significant for DM, HTN, s/p right BKA, HLD, CHF, CKD stage III, and others as below who presents to the ED with complaints of left leg infection.  Patient reports that 1 week ago, he noticed onset of a prior left great toe amputation site ulcer that has progressed over the course of the last 2 to 3 days to be purulent, erythematous, draining, and associated with increased swelling and pain.  He reports associated shortness of breath, chills, and fatigue.  He does not follow with a wound care physician, and has been applying triple antibiotic ointment and clean and dry dressings to the wound daily.  He is not currently taking any systemic antibiotics.  Denies any use of blood thinners, and denies any prior history of known blood clots.  Past Medical History:  Diagnosis Date   Diabetes mellitus without complication ([place])    Hypertension     Patient Active Problem List   Diagnosis Date Noted   Shortness of breath 05/16/2021   Chronic diastolic (congestive) heart failure ([place]) 05/16/2021   Diabetes mellitus without complication ([place]) 44/11/4740   Right below-knee amputee ([place]) 10/30/2019   Dyslipidemia 10/30/2019   Type 2 diabetes mellitus with diabetic polyneuropathy, with long-term current use of insulin ([place]) 10/12/2019   Type 2 diabetes mellitus with stage 3a chronic kidney disease, without long-term current use of insulin ([place]) 10/12/2019   Essential hypertension 09/25/2019   Type 2 diabetes mellitus with hyperglycemia, with long-term current use of insulin ([place]) 09/25/2019   Stage 3a chronic kidney disease ([place]) 09/25/2019   Hematuria 09/25/2019   Acquired complex renal cyst 09/25/2019   Urinary hesitancy 09/25/2019    Prosthesis adjustments 09/25/2019   Need for immunization against influenza 09/25/2019   Need for Tdap vaccination 09/25/2019   Erectile dysfunction 09/25/2019   Healthcare maintenance 09/25/2019   Pulmonary nodules 09/25/2019   Depression with anxiety 09/25/2019    Past Surgical History:  Procedure Laterality Date   BELOW KNEE LEG AMPUTATION Right        Family History  Problem Relation Age of Onset   Anxiety disorder Mother    Cancer Father     Social History   Tobacco Use   Smoking status: Never   Smokeless tobacco: Never  Vaping Use   Vaping Use: Never used  Substance Use Topics   Alcohol use: Never   Drug use: Never    Home Medications Prior to Admission medications   Medication Sig Start Date End Date Taking? Authorizing Provider  furosemide (LASIX) 40 MG tablet Take 40 mg once daily for a weight under 240 lbs. For a weight over 240 lbs, take 80 mg (2 tablets) Patient taking differently: Take 40-80 mg by mouth daily. . For a weight over 240 lbs, take 80 mg (2 tablets) 05/16/21  Yes Croitoru, Mihai, MD  ibuprofen (ADVIL) 200 MG tablet Take 600-800 mg by mouth every 6 (six) hours as needed for headache or moderate pain.   Yes [provider]  insulin NPH Human (NOVOLIN N) 100 UNIT/ML injection Inject 0.15 mLs (15 Units total) into the skin at bedtime. Patient taking differently: Inject 20 Units into the skin See admin instructions. 3-4 times daily 10/12/19  Yes Shamleffer,  Melanie Crazier, MD  lisinopril (ZESTRIL) 10 MG tablet Take 1 tablet (10 mg total) by mouth daily. 05/16/21 08/14/21 Yes Croitoru, Mihai, MD  Omega 3 1000 MG CAPS Take 1,000 mg by mouth daily.   Yes [provider]  Oxycodone HCl 10 MG TABS Take 10 mg by mouth 3 (three) times daily as needed (pain). 07/20/19  Yes [provider]  potassium chloride SA (KLOR-CON) 20 MEQ tablet Take 1 tablet (20 mEq total) by mouth daily. 04/24/21  Yes Croitoru, Mihai, MD  sildenafil (VIAGRA) 100  MG tablet Take 100 mg by mouth daily as needed for erectile dysfunction. 02/05/21  Yes [provider]  metoprolol tartrate (LOPRESSOR) 100 MG tablet Take one tablet two hours before the test Patient not taking: No sig reported 05/16/21   Croitoru, Mihai, MD  sildenafil (VIAGRA) 50 MG tablet Take 1 tablet (50 mg total) by mouth daily as needed for erectile dysfunction. Patient not taking: Reported on 07/21/2021 09/25/19   Libby Maw, MD    Allergies    Bactrim [sulfamethoxazole-trimethoprim], Ceprotin [protein c concentrate (human)], Ciprofloxacin, and Levaquin [levofloxacin]  Review of Systems   Review of Systems  Constitutional:  Positive for chills, diaphoresis and fatigue. Negative for activity change, appetite change and fever.  HENT:  Negative for sore throat.   Eyes:  Negative for pain and visual disturbance.  Respiratory:  Positive for chest tightness and shortness of breath. Negative for cough.   Cardiovascular:  Positive for leg swelling. Negative for chest pain and palpitations.  Gastrointestinal:  Negative for abdominal pain, nausea and vomiting.  Musculoskeletal:  Negative for neck pain and neck stiffness.  Skin:  Positive for wound. Negative for color change, pallor and rash.  Neurological:  Positive for weakness and light-headedness. Negative for dizziness, tremors, seizures, syncope, speech difficulty, numbness and headaches.  Psychiatric/Behavioral:  Negative for confusion. The patient is nervous/anxious.   All other systems reviewed and are negative.  Physical Exam Updated Vital Signs BP 113/61   Pulse (!) 137   Temp (!) 97.5 F (36.4 C) (Tympanic)   Resp (!) 34   Ht 6\' 3"  (1.905 m)   Wt 108.9 kg Comment: Sept 2022 records  SpO2 93%   BMI 30.00 kg/m   Physical Exam Vitals and nursing note reviewed.  Constitutional:      General: He is in acute distress.     Appearance: Normal appearance. He is well-developed. He is ill-appearing and  diaphoretic.  HENT:     Head: Normocephalic and atraumatic.     Right Ear: External ear normal.     Left Ear: External ear normal.     Nose: Nose normal.     Mouth/Throat:     Mouth: Mucous membranes are moist.     Pharynx: Oropharynx is clear.  Eyes:     Extraocular Movements: Extraocular movements intact.     Conjunctiva/sclera: Conjunctivae normal.     Pupils: Pupils are equal, round, and reactive to light.  Cardiovascular:     Rate and Rhythm: Regular rhythm. Tachycardia present.     Pulses:          Dorsalis pedis pulses are 2+ on the left side.       Posterior tibial pulses are 1+ on the left side.     Heart sounds: Normal heart sounds. No murmur heard. Pulmonary:     Effort: Pulmonary effort is normal. No respiratory distress.     Breath sounds: Normal breath sounds.  Abdominal:  General: There is no distension.     Palpations: Abdomen is soft.     Tenderness: There is no abdominal tenderness.  Musculoskeletal:     Cervical back: Normal range of motion and neck supple. No rigidity or tenderness.     Left lower leg: Edema present.     Left foot: Decreased range of motion.     Right Lower Extremity: Right leg is amputated below knee.  Feet:     Left foot:     Protective Sensation: 5 sites tested.  5 sites sensed.     Skin integrity: Ulcer, skin breakdown, erythema, warmth and callus present.     Comments: Ulcer to base of left plantar great toe amputation site with maceration and significant devitalized tissue.  Surrounding erythema, warmth, edema, and tenderness with purulence surrounding the left great toe. Lymphadenopathy:     Cervical: No cervical adenopathy.  Skin:    General: Skin is warm.     Capillary Refill: Capillary refill takes less than 2 seconds.  Neurological:     General: No focal deficit present.     Mental Status: He is oriented to person, place, and time. Mental status is at baseline.     GCS: GCS eye subscore is 4. GCS verbal subscore is 5. GCS  motor subscore is 6.  Psychiatric:        Behavior: Behavior normal. Behavior is cooperative.    ED Results / Procedures / Treatments   Labs (all labs ordered are listed, but only abnormal results are displayed) Labs Reviewed  COMPREHENSIVE METABOLIC PANEL - Abnormal; Notable for the following components:      Result Value   Sodium 123 (*)    Chloride 87 (*)    CO2 18 (*)    Glucose, Bld 516 (*)    BUN 40 (*)    Creatinine, Ser 2.76 (*)    Calcium 8.6 (*)    Total Protein 5.6 (*)    Albumin 2.0 (*)    Alkaline Phosphatase 242 (*)    GFR, Estimated 26 (*)    Anion gap 18 (*)    All other components within normal limits  LACTIC ACID, PLASMA - Abnormal; Notable for the following components:   Lactic Acid, Venous 4.0 (*)    All other components within normal limits  LACTIC ACID, PLASMA - Abnormal; Notable for the following components:   Lactic Acid, Venous 2.9 (*)    All other components within normal limits  CBC WITH DIFFERENTIAL/PLATELET - Abnormal; Notable for the following components:   WBC 13.0 (*)    RBC 6.30 (*)    Hemoglobin 17.9 (*)    HCT 53.2 (*)    Platelets 141 (*)    Neutro Abs 12.2 (*)    Lymphs Abs 0.1 (*)    Abs Immature Granulocytes 0.30 (*)    All other components within normal limits  PROTIME-INR - Abnormal; Notable for the following components:   Prothrombin Time 16.8 (*)    INR 1.4 (*)    All other components within normal limits  SEDIMENTATION RATE - Abnormal; Notable for the following components:   Sed Rate 20 (*)    All other components within normal limits  C-REACTIVE PROTEIN - Abnormal; Notable for the following components:   CRP 23.2 (*)    All other components within normal limits  BETA-HYDROXYBUTYRIC ACID - Abnormal; Notable for the following components:   Beta-Hydroxybutyric Acid 2.53 (*)    All other components within normal limits  HEMOGLOBIN A1C - Abnormal; Notable for the following components:   Hgb A1c MFr Bld 13.8 (*)    All  other components within normal limits  I-STAT VENOUS BLOOD GAS, ED - Abnormal; Notable for the following components:   pCO2, Ven 38.3 (*)    pO2, Ven 25.0 (*)    Acid-base deficit 4.0 (*)    Sodium 124 (*)    Calcium, Ion 1.13 (*)    All other components within normal limits  I-STAT CHEM 8, ED - Abnormal; Notable for the following components:   Sodium 124 (*)    Chloride 92 (*)    BUN 41 (*)    Creatinine, Ser 2.30 (*)    Glucose, Bld 459 (*)    Calcium, Ion 1.09 (*)    TCO2 21 (*)    All other components within normal limits  RESP PANEL BY RT-PCR (FLU A&B, COVID) ARPGX2  CULTURE, BLOOD (ROUTINE X 2)  CULTURE, BLOOD (ROUTINE X 2)  URINE CULTURE  APTT  URINALYSIS, ROUTINE W REFLEX MICROSCOPIC  HEPARIN LEVEL (UNFRACTIONATED)  BASIC METABOLIC PANEL  BASIC METABOLIC PANEL  BASIC METABOLIC PANEL  BASIC METABOLIC PANEL  BASIC METABOLIC PANEL  BETA-HYDROXYBUTYRIC ACID  BETA-HYDROXYBUTYRIC ACID  BETA-HYDROXYBUTYRIC ACID   Radiology DG Chest 1 View  Result Date: 07/22/2021 EXAM: EXAM CHEST  1 VIEW COMPARISON:  Portable AP chest today at 8:08 p.m. FINDINGS: Provided are 2 separate lateral chest radiographs on this patient. There is a trace left pleural effusion but there is no convincing overlying infiltrate. Exaggerated thoracic kyphosis is noted with multilevel thoracic spine degenerative discs. IMPRESSION: Trace left pleural effusion visible on the lateral view, but no definitive adjacent infiltrate. Electronically Signed   By: Telford Nab M.D.   On: 07/22/2021 00:52   DG Chest Port 1 View  Result Date: 07/21/2021 CLINICAL DATA:  55 year old male with pain. EXAM: PORTABLE CHEST 1 VIEW COMPARISON:  None. FINDINGS: UPPER limits normal heart size noted. Equivocal LEFT LOWER lobe retrocardiac density/atelectasis may be present. There is no other evidence of focal airspace disease, pulmonary edema, suspicious pulmonary nodule/mass, pleural effusion, or pneumothorax. No acute bony  abnormalities are identified. IMPRESSION: 1. Equivocal LEFT LOWER lobe retrocardiac density/atelectasis. Recommend LATERAL chest radiograph if there is clinical suspicion for pneumonia. Electronically Signed   By: Margarette Canada M.D.   On: 07/21/2021 20:50   DG Toe Great Left  Result Date: 07/21/2021 CLINICAL DATA:  Left great toe pain, osteomyelitis EXAM: LEFT GREAT TOE COMPARISON:  None. FINDINGS: Three view radiograph left great toe demonstrates resection of the distal phalanx of the second digit and probable trans phalangeal amputation proximal phalanx of the great toe. There is a soft tissue ulcer involving the distal, medial, plantar aspect the residual left greatq toe. There is subcutaneous gas within the a dorsal lateral soft tissues of the left great toe. There are periarticular lucencies within the a distal phalanx and distal aspect of the first metatarsal medially suspicious for changes of osteomyelitis/septic arthritis. Superimposed moderate degenerative arthritis of the first MTP joint. IMPRESSION: Postsurgical changes as described above. Subcutaneous gas within the dorsal lateral soft tissues at the level of the first MTP joint with subjacent periarticular lucency within the a base of the first proximal phalanx and distal aspect of the first metatarsal suspicious for changes of septic arthritis/osteomyelitis. This could be confirmed with contrast enhanced MRI examination. Electronically Signed   By: Fidela Salisbury M.D.   On: 07/21/2021 20:59    Procedures Procedures   Medications  Ordered in ED Medications  vancomycin variable dose per unstable renal function (pharmacist dosing) (has no administration in time range)  heparin ADULT infusion 100 units/mL (25000 units/255mL) (1,700 Units/hr Intravenous New Bag/Given 07/22/21 0109)  insulin regular, human (MYXREDLIN) 100 units/ 100 mL infusion (has no administration in time range)  lactated ringers infusion ( Intravenous New Bag/Given 07/22/21  0111)  dextrose 5 % in lactated ringers infusion (has no administration in time range)  dextrose 50 % solution 0-50 mL (has no administration in time range)  potassium chloride 10 mEq in 100 mL IVPB (has no administration in time range)  clindamycin (CLEOCIN) IVPB 600 mg (has no administration in time range)  piperacillin-tazobactam (ZOSYN) IVPB 3.375 g (3.375 g Intravenous New Bag/Given 07/22/21 0117)  piperacillin-tazobactam (ZOSYN) IVPB 3.375 g (has no administration in time range)  lactated ringers bolus 1,000 mL (0 mLs Intravenous Stopped 07/21/21 2148)    And  lactated ringers bolus 1,000 mL (0 mLs Intravenous Stopped 07/21/21 2149)    And  lactated ringers bolus 1,000 mL (0 mLs Intravenous Stopped 07/21/21 2149)    And  lactated ringers bolus 500 mL (0 mLs Intravenous Stopped 07/22/21 0005)  vancomycin (VANCOREADY) IVPB 2000 mg/400 mL (0 mg Intravenous Stopped 07/22/21 0004)  cefTRIAXone (ROCEPHIN) 2 g in sodium chloride 0.9 % 100 mL IVPB (0 g Intravenous Stopped 07/21/21 2130)  oxyCODONE (Oxy IR/ROXICODONE) immediate release tablet 10 mg (10 mg Oral Given 07/21/21 2258)  lactated ringers bolus 1,000 mL (1,000 mLs Intravenous New Bag/Given 07/22/21 0010)  heparin bolus via infusion 5,000 Units (5,000 Units Intravenous Bolus from Bag 07/22/21 0109)    ED Course  I have reviewed the triage vital signs and the nursing notes.  Pertinent labs & imaging results that were available during my care of the patient were reviewed by me and considered in my medical decision making (see chart for details).    MDM Rules/Calculators/A&P                          [name] is a 55 y.o. male presenting with left foot wound. Initial VS sig for hypotension, tachycardia and tachypnea.   EKG interpretation: Atrial flutter with 2-1 conduction and a rate 154 bpm, wide QTC.  No ST depressions, 1 mm ST elevation in V1.  Rhythm changed from prior EKG.  Labs: COVID/flu negative. Coags elevated.   Cultures obtained per sepsis protocol.  Leukocytosis with left shift, hemoconcentration, and mild thrombocytopenia.  Hyponatremia, hypochloremia, hyperglycemia with anion gap of 18, elevated BUN and creatinine from prior baseline, hypoalbuminemia and elevated alkaline phosphatase.  Lactic acidosis of 2.9, improving after fluid resuscitation.  Elevated inflammatory markers.  Beta hydroxybutyrate elevated at 2.53.  A1c 13.8.  VBG with hypoxia, but pH within normal limits at 7.358.  Na, creatinine, and glucose improving after fluids.  Imaging: CXR notable for small left pleural effusion.  Postsurgical changes of the left foot, and x-ray notable for subcutaneous gas and first metatarsal bony destruction concerning for septic arthritis/osteomyelitis.  CT left foot pending.. Imaging was reviewed by radiology and personally by me.  DDX considered: Bacteremia, cellulitis, abscess, open fracture, compartment syndrome, pneumonia, UTI, COVID-19, influenza, PE, necrotizing fasciitis, DKA. History, examination, and objective data most consistent with sepsis, likely secondary to left foot presumed osteomyelitis.  Patient has superficial cellulitis, but suspect deeper infection on exam.  Neurovascularly intact with soft compartments.  Patient has nonpitting edema to LLE, DVT remains possible.  No sign of fracture  or trauma to the area.  No induration or fluctuance concerning for localized abscess.  No other source of infection isolated on work-up.  PE considered secondary to persistent tachycardia and tachypnea with reported shortness of breath, however, AKI precludes scanning for verification.  Will initiate empiric treatment for PE.  Low suspicion for DKA, suspect elevated anion gap secondary to lactic acidosis.  Initial labs do not meet DKA diagnosis criteria.  Medications: Medications  vancomycin variable dose per unstable renal function (pharmacist dosing) (has no administration in time range)  heparin ADULT infusion  100 units/mL (25000 units/266mL) (1,700 Units/hr Intravenous New Bag/Given 07/22/21 0109)  insulin regular, human (MYXREDLIN) 100 units/ 100 mL infusion (has no administration in time range)  lactated ringers infusion ( Intravenous New Bag/Given 07/22/21 0111)  dextrose 5 % in lactated ringers infusion (has no administration in time range)  dextrose 50 % solution 0-50 mL (has no administration in time range)  potassium chloride 10 mEq in 100 mL IVPB (has no administration in time range)  clindamycin (CLEOCIN) IVPB 600 mg (has no administration in time range)  piperacillin-tazobactam (ZOSYN) IVPB 3.375 g (3.375 g Intravenous New Bag/Given 07/22/21 0117)  piperacillin-tazobactam (ZOSYN) IVPB 3.375 g (has no administration in time range)  lactated ringers bolus 1,000 mL (0 mLs Intravenous Stopped 07/21/21 2148)    And  lactated ringers bolus 1,000 mL (0 mLs Intravenous Stopped 07/21/21 2149)    And  lactated ringers bolus 1,000 mL (0 mLs Intravenous Stopped 07/21/21 2149)    And  lactated ringers bolus 500 mL (0 mLs Intravenous Stopped 07/22/21 0005)  vancomycin (VANCOREADY) IVPB 2000 mg/400 mL (0 mg Intravenous Stopped 07/22/21 0004)  cefTRIAXone (ROCEPHIN) 2 g in sodium chloride 0.9 % 100 mL IVPB (0 g Intravenous Stopped 07/21/21 2130)  oxyCODONE (Oxy IR/ROXICODONE) immediate release tablet 10 mg (10 mg Oral Given 07/21/21 2258)  lactated ringers bolus 1,000 mL (1,000 mLs Intravenous New Bag/Given 07/22/21 0010)  heparin bolus via infusion 5,000 Units (5,000 Units Intravenous Bolus from Bag 07/22/21 0109)    Fluid responsive after sepsis protocol, BPs improving and tachycardia improving after treatment.  Repeat EKG pending to determine new atrial flutter versus sinus tachycardia secondary to sepsis.  Given persistent tachycardia despite resuscitation, will initiate PE treatment.  No indication for pressors.  Patient will likely require surgery consult during admission for definitive management  of left foot. Re-evaluated prior to admission. Hemodynamically stable, remains tachycardic and tachypneic. States pain is improved.  Admission pending to hospitalist at shift change. Oncoming provider to follow-up on pending labs and imaging.  Further management per oncoming team.  Final disposition pending. Patient understands and agrees with the plan.   Transfer of Care Note I transferred care of [name] at the end of my shift.  I discussed the patient's presentation and critical findings with the oncoming team.  Please see their documentation for the remainder of care.  I answered the incoming team's questions to the best of my ability.   The plan for this patient was discussed with my attending physician, who voiced agreement and who oversaw evaluation and treatment of this patient.     Note: Estate manager/land agent was used in the creation of this note.  Final Clinical Impression(s) / ED Diagnoses Final diagnoses:  Sepsis [facility])    Rx / [place] Orders ED Discharge Orders     None        Cherly Hensen, DO 07/22/21 0126    Elnora Morrison, MD 07/23/21 [phone]

## 2021-07-22 ENCOUNTER — Emergency Department (HOSPITAL_COMMUNITY): Payer: Medicare HMO

## 2021-07-22 ENCOUNTER — Encounter (HOSPITAL_COMMUNITY): Payer: Self-pay | Admitting: Internal Medicine

## 2021-07-22 ENCOUNTER — Inpatient Hospital Stay (HOSPITAL_COMMUNITY): Payer: Medicare HMO

## 2021-07-22 DIAGNOSIS — I130 Hypertensive heart and chronic kidney disease with heart failure and stage 1 through stage 4 chronic kidney disease, or unspecified chronic kidney disease: Secondary | ICD-10-CM | POA: Diagnosis present

## 2021-07-22 DIAGNOSIS — R6520 Severe sepsis without septic shock: Secondary | ICD-10-CM | POA: Diagnosis present

## 2021-07-22 DIAGNOSIS — N1831 Chronic kidney disease, stage 3a: Secondary | ICD-10-CM

## 2021-07-22 DIAGNOSIS — G061 Intraspinal abscess and granuloma: Secondary | ICD-10-CM | POA: Diagnosis present

## 2021-07-22 DIAGNOSIS — D696 Thrombocytopenia, unspecified: Secondary | ICD-10-CM | POA: Diagnosis present

## 2021-07-22 DIAGNOSIS — A419 Sepsis, unspecified organism: Secondary | ICD-10-CM

## 2021-07-22 DIAGNOSIS — B9561 Methicillin susceptible Staphylococcus aureus infection as the cause of diseases classified elsewhere: Secondary | ICD-10-CM | POA: Diagnosis not present

## 2021-07-22 DIAGNOSIS — R0602 Shortness of breath: Secondary | ICD-10-CM | POA: Diagnosis not present

## 2021-07-22 DIAGNOSIS — M726 Necrotizing fasciitis: Secondary | ICD-10-CM | POA: Diagnosis present

## 2021-07-22 DIAGNOSIS — R6521 Severe sepsis with septic shock: Secondary | ICD-10-CM | POA: Diagnosis present

## 2021-07-22 DIAGNOSIS — M7989 Other specified soft tissue disorders: Secondary | ICD-10-CM | POA: Diagnosis not present

## 2021-07-22 DIAGNOSIS — N179 Acute kidney failure, unspecified: Secondary | ICD-10-CM | POA: Diagnosis not present

## 2021-07-22 DIAGNOSIS — Z794 Long term (current) use of insulin: Secondary | ICD-10-CM | POA: Diagnosis not present

## 2021-07-22 DIAGNOSIS — L089 Local infection of the skin and subcutaneous tissue, unspecified: Secondary | ICD-10-CM | POA: Diagnosis not present

## 2021-07-22 DIAGNOSIS — I4892 Unspecified atrial flutter: Secondary | ICD-10-CM | POA: Diagnosis present

## 2021-07-22 DIAGNOSIS — M86172 Other acute osteomyelitis, left ankle and foot: Secondary | ICD-10-CM | POA: Diagnosis present

## 2021-07-22 DIAGNOSIS — E1152 Type 2 diabetes mellitus with diabetic peripheral angiopathy with gangrene: Secondary | ICD-10-CM | POA: Diagnosis present

## 2021-07-22 DIAGNOSIS — E1110 Type 2 diabetes mellitus with ketoacidosis without coma: Secondary | ICD-10-CM | POA: Diagnosis present

## 2021-07-22 DIAGNOSIS — Z23 Encounter for immunization: Secondary | ICD-10-CM | POA: Diagnosis present

## 2021-07-22 DIAGNOSIS — M4644 Discitis, unspecified, thoracic region: Secondary | ICD-10-CM | POA: Diagnosis not present

## 2021-07-22 DIAGNOSIS — E1142 Type 2 diabetes mellitus with diabetic polyneuropathy: Secondary | ICD-10-CM

## 2021-07-22 DIAGNOSIS — R079 Chest pain, unspecified: Secondary | ICD-10-CM

## 2021-07-22 DIAGNOSIS — Z20822 Contact with and (suspected) exposure to covid-19: Secondary | ICD-10-CM | POA: Diagnosis present

## 2021-07-22 DIAGNOSIS — E11628 Type 2 diabetes mellitus with other skin complications: Secondary | ICD-10-CM

## 2021-07-22 DIAGNOSIS — M4624 Osteomyelitis of vertebra, thoracic region: Secondary | ICD-10-CM | POA: Diagnosis not present

## 2021-07-22 DIAGNOSIS — I517 Cardiomegaly: Secondary | ICD-10-CM | POA: Diagnosis not present

## 2021-07-22 DIAGNOSIS — E1169 Type 2 diabetes mellitus with other specified complication: Secondary | ICD-10-CM | POA: Diagnosis present

## 2021-07-22 DIAGNOSIS — L03116 Cellulitis of left lower limb: Secondary | ICD-10-CM | POA: Diagnosis present

## 2021-07-22 DIAGNOSIS — I5042 Chronic combined systolic (congestive) and diastolic (congestive) heart failure: Secondary | ICD-10-CM | POA: Diagnosis present

## 2021-07-22 DIAGNOSIS — D179 Benign lipomatous neoplasm, unspecified: Secondary | ICD-10-CM | POA: Diagnosis not present

## 2021-07-22 DIAGNOSIS — I5032 Chronic diastolic (congestive) heart failure: Secondary | ICD-10-CM

## 2021-07-22 DIAGNOSIS — A480 Gas gangrene: Secondary | ICD-10-CM | POA: Diagnosis present

## 2021-07-22 DIAGNOSIS — A401 Sepsis due to streptococcus, group B: Secondary | ICD-10-CM | POA: Diagnosis present

## 2021-07-22 DIAGNOSIS — L97509 Non-pressure chronic ulcer of other part of unspecified foot with unspecified severity: Secondary | ICD-10-CM | POA: Diagnosis not present

## 2021-07-22 DIAGNOSIS — E1122 Type 2 diabetes mellitus with diabetic chronic kidney disease: Secondary | ICD-10-CM | POA: Diagnosis present

## 2021-07-22 DIAGNOSIS — B951 Streptococcus, group B, as the cause of diseases classified elsewhere: Secondary | ICD-10-CM | POA: Diagnosis not present

## 2021-07-22 DIAGNOSIS — R7881 Bacteremia: Secondary | ICD-10-CM | POA: Diagnosis not present

## 2021-07-22 DIAGNOSIS — Z89511 Acquired absence of right leg below knee: Secondary | ICD-10-CM

## 2021-07-22 DIAGNOSIS — N170 Acute kidney failure with tubular necrosis: Secondary | ICD-10-CM | POA: Diagnosis present

## 2021-07-22 DIAGNOSIS — L89150 Pressure ulcer of sacral region, unstageable: Secondary | ICD-10-CM | POA: Diagnosis not present

## 2021-07-22 DIAGNOSIS — E11621 Type 2 diabetes mellitus with foot ulcer: Secondary | ICD-10-CM | POA: Diagnosis present

## 2021-07-22 DIAGNOSIS — M19072 Primary osteoarthritis, left ankle and foot: Secondary | ICD-10-CM | POA: Diagnosis not present

## 2021-07-22 DIAGNOSIS — I10 Essential (primary) hypertension: Secondary | ICD-10-CM | POA: Diagnosis not present

## 2021-07-22 DIAGNOSIS — L89300 Pressure ulcer of unspecified buttock, unstageable: Secondary | ICD-10-CM | POA: Diagnosis not present

## 2021-07-22 DIAGNOSIS — I519 Heart disease, unspecified: Secondary | ICD-10-CM | POA: Diagnosis not present

## 2021-07-22 DIAGNOSIS — I5020 Unspecified systolic (congestive) heart failure: Secondary | ICD-10-CM | POA: Diagnosis not present

## 2021-07-22 DIAGNOSIS — E43 Unspecified severe protein-calorie malnutrition: Secondary | ICD-10-CM | POA: Diagnosis present

## 2021-07-22 DIAGNOSIS — M5134 Other intervertebral disc degeneration, thoracic region: Secondary | ICD-10-CM | POA: Diagnosis not present

## 2021-07-22 DIAGNOSIS — E871 Hypo-osmolality and hyponatremia: Secondary | ICD-10-CM | POA: Diagnosis present

## 2021-07-22 HISTORY — DX: Gas gangrene: A48.0

## 2021-07-22 HISTORY — DX: Acute kidney failure, unspecified: N17.9

## 2021-07-22 HISTORY — DX: Sepsis, unspecified organism: A41.9

## 2021-07-22 HISTORY — DX: Chest pain, unspecified: R07.9

## 2021-07-22 LAB — BASIC METABOLIC PANEL
Anion gap: 15 (ref 5–15)
Anion gap: 15 (ref 5–15)
Anion gap: 12 (ref 5–15)
Anion gap: 10 (ref 5–15)
BUN: 42 mg/dL — ABNORMAL HIGH (ref 6–20)
BUN: 43 mg/dL — ABNORMAL HIGH (ref 6–20)
BUN: 45 mg/dL — ABNORMAL HIGH (ref 6–20)
BUN: 49 mg/dL — ABNORMAL HIGH (ref 6–20)
CO2: 16 mmol/L — ABNORMAL LOW (ref 22–32)
CO2: 17 mmol/L — ABNORMAL LOW (ref 22–32)
CO2: 23 mmol/L (ref 22–32)
CO2: 20 mmol/L — ABNORMAL LOW (ref 22–32)
Calcium: 7.9 mg/dL — ABNORMAL LOW (ref 8.9–10.3)
Calcium: 8 mg/dL — ABNORMAL LOW (ref 8.9–10.3)
Calcium: 8 mg/dL — ABNORMAL LOW (ref 8.9–10.3)
Calcium: 7.8 mg/dL — ABNORMAL LOW (ref 8.9–10.3)
Chloride: 90 mmol/L — ABNORMAL LOW (ref 98–111)
Chloride: 93 mmol/L — ABNORMAL LOW (ref 98–111)
Chloride: 91 mmol/L — ABNORMAL LOW (ref 98–111)
Chloride: 97 mmol/L — ABNORMAL LOW (ref 98–111)
Creatinine, Ser: 2.32 mg/dL — ABNORMAL HIGH (ref 0.61–1.24)
Creatinine, Ser: 2.32 mg/dL — ABNORMAL HIGH (ref 0.61–1.24)
Creatinine, Ser: 2.56 mg/dL — ABNORMAL HIGH (ref 0.61–1.24)
Creatinine, Ser: 2.56 mg/dL — ABNORMAL HIGH (ref 0.61–1.24)
GFR, Estimated: 32 mL/min — ABNORMAL LOW (ref >60)
GFR, Estimated: 32 mL/min — ABNORMAL LOW (ref >60)
GFR, Estimated: 29 mL/min — ABNORMAL LOW (ref >60)
GFR, Estimated: 29 mL/min — ABNORMAL LOW (ref >60)
Glucose, Bld: 453 mg/dL — ABNORMAL HIGH (ref 70–99)
Glucose, Bld: 390 mg/dL — ABNORMAL HIGH (ref 70–99)
Glucose, Bld: 180 mg/dL — ABNORMAL HIGH (ref 70–99)
Glucose, Bld: 216 mg/dL — ABNORMAL HIGH (ref 70–99)
Potassium: 4 mmol/L (ref 3.5–5.1)
Potassium: 4.1 mmol/L (ref 3.5–5.1)
Potassium: 3.9 mmol/L (ref 3.5–5.1)
Potassium: 3.7 mmol/L (ref 3.5–5.1)
Sodium: 121 mmol/L — ABNORMAL LOW (ref 135–145)
Sodium: 125 mmol/L — ABNORMAL LOW (ref 135–145)
Sodium: 126 mmol/L — ABNORMAL LOW (ref 135–145)
Sodium: 127 mmol/L — ABNORMAL LOW (ref 135–145)

## 2021-07-22 LAB — BLOOD CULTURE ID PANEL (REFLEXED) - BCID2

## 2021-07-22 LAB — I-STAT VENOUS BLOOD GAS, ED
Acid-base deficit: 4 mmol/L — ABNORMAL HIGH (ref 0.0–2.0)
Acid-base deficit: 7 mmol/L — ABNORMAL HIGH (ref 0.0–2.0)
Bicarbonate: 17.9 mmol/L — ABNORMAL LOW (ref 20.0–28.0)
Bicarbonate: 21.5 mmol/L (ref 20.0–28.0)
Calcium, Ion: 1.09 mmol/L — ABNORMAL LOW (ref 1.15–1.40)
Calcium, Ion: 1.13 mmol/L — ABNORMAL LOW (ref 1.15–1.40)
HCT: 47 % (ref 39.0–52.0)
HCT: 48 % (ref 39.0–52.0)
Hemoglobin: 16 g/dL (ref 13.0–17.0)
Hemoglobin: 16.3 g/dL (ref 13.0–17.0)
O2 Saturation: 43 %
O2 Saturation: 81 %
Potassium: 3.9 mmol/L (ref 3.5–5.1)
Potassium: 4.4 mmol/L (ref 3.5–5.1)
Sodium: 123 mmol/L — ABNORMAL LOW (ref 135–145)
Sodium: 124 mmol/L — ABNORMAL LOW (ref 135–145)
TCO2: 19 mmol/L — ABNORMAL LOW (ref 22–32)
TCO2: 23 mmol/L (ref 22–32)
pCO2, Ven: 32.4 mmHg — ABNORMAL LOW (ref 44.0–60.0)
pCO2, Ven: 38.3 mmHg — ABNORMAL LOW (ref 44.0–60.0)
pH, Ven: 7.349 (ref 7.250–7.430)
pH, Ven: 7.358 (ref 7.250–7.430)
pO2, Ven: 25 mmHg — CL (ref 32.0–45.0)
pO2, Ven: 47 mmHg — ABNORMAL HIGH (ref 32.0–45.0)

## 2021-07-22 LAB — I-STAT CHEM 8, ED
BUN: 41 mg/dL — ABNORMAL HIGH (ref 6–20)
Calcium, Ion: 1.09 mmol/L — ABNORMAL LOW (ref 1.15–1.40)
Chloride: 92 mmol/L — ABNORMAL LOW (ref 98–111)
Creatinine, Ser: 2.3 mg/dL — ABNORMAL HIGH (ref 0.61–1.24)
Glucose, Bld: 459 mg/dL — ABNORMAL HIGH (ref 70–99)
HCT: 48 % (ref 39.0–52.0)
Hemoglobin: 16.3 g/dL (ref 13.0–17.0)
Potassium: 4.4 mmol/L (ref 3.5–5.1)
Sodium: 124 mmol/L — ABNORMAL LOW (ref 135–145)
TCO2: 21 mmol/L — ABNORMAL LOW (ref 22–32)

## 2021-07-22 LAB — BETA-HYDROXYBUTYRIC ACID
Beta-Hydroxybutyric Acid: 2.76 mmol/L — ABNORMAL HIGH (ref 0.05–0.27)
Beta-Hydroxybutyric Acid: 0.13 mmol/L (ref 0.05–0.27)

## 2021-07-22 LAB — CBG MONITORING, ED
Glucose-Capillary: 458 mg/dL — ABNORMAL HIGH (ref 70–99)
Glucose-Capillary: 460 mg/dL — ABNORMAL HIGH (ref 70–99)
Glucose-Capillary: 415 mg/dL — ABNORMAL HIGH (ref 70–99)
Glucose-Capillary: 415 mg/dL — ABNORMAL HIGH (ref 70–99)
Glucose-Capillary: 372 mg/dL — ABNORMAL HIGH (ref 70–99)
Glucose-Capillary: 296 mg/dL — ABNORMAL HIGH (ref 70–99)
Glucose-Capillary: 231 mg/dL — ABNORMAL HIGH (ref 70–99)
Glucose-Capillary: 208 mg/dL — ABNORMAL HIGH (ref 70–99)
Glucose-Capillary: 187 mg/dL — ABNORMAL HIGH (ref 70–99)
Glucose-Capillary: 181 mg/dL — ABNORMAL HIGH (ref 70–99)
Glucose-Capillary: 175 mg/dL — ABNORMAL HIGH (ref 70–99)
Glucose-Capillary: 164 mg/dL — ABNORMAL HIGH (ref 70–99)
Glucose-Capillary: 135 mg/dL — ABNORMAL HIGH (ref 70–99)
Glucose-Capillary: 130 mg/dL — ABNORMAL HIGH (ref 70–99)
Glucose-Capillary: 170 mg/dL — ABNORMAL HIGH (ref 70–99)

## 2021-07-22 LAB — URINALYSIS, ROUTINE W REFLEX MICROSCOPIC
Bilirubin Urine: NEGATIVE
Glucose, UA: >=500 mg/dL — AB
Ketones, ur: 5 mg/dL — AB
Nitrite: NEGATIVE
Protein, ur: 30 mg/dL — AB
Specific Gravity, Urine: 1.018 (ref 1.005–1.030)
pH: 5 (ref 5.0–8.0)

## 2021-07-22 LAB — HEPARIN LEVEL (UNFRACTIONATED)
Heparin Unfractionated: <0.1 IU/mL — ABNORMAL LOW (ref 0.30–0.70)
Heparin Unfractionated: <0.1 IU/mL — ABNORMAL LOW (ref 0.30–0.70)

## 2021-07-22 LAB — CBC
HCT: 46.3 % (ref 39.0–52.0)
Hemoglobin: 15.7 g/dL (ref 13.0–17.0)
MCH: 28.9 pg (ref 26.0–34.0)
MCHC: 33.9 g/dL (ref 30.0–36.0)
MCV: 85.3 fL (ref 80.0–100.0)
Platelets: 108 10*3/uL — ABNORMAL LOW (ref 150–400)
RBC: 5.43 MIL/uL (ref 4.22–5.81)
RDW: 14.7 % (ref 11.5–15.5)
WBC: 11 10*3/uL — ABNORMAL HIGH (ref 4.0–10.5)
nRBC: 0.2 % (ref 0.0–0.2)

## 2021-07-22 LAB — CORTISOL-AM, BLOOD: Cortisol - AM: 27.8 ug/dL — ABNORMAL HIGH (ref 6.7–22.6)

## 2021-07-22 LAB — GLUCOSE, CAPILLARY
Glucose-Capillary: 138 mg/dL — ABNORMAL HIGH (ref 70–99)
Glucose-Capillary: 147 mg/dL — ABNORMAL HIGH (ref 70–99)
Glucose-Capillary: 157 mg/dL — ABNORMAL HIGH (ref 70–99)
Glucose-Capillary: 234 mg/dL — ABNORMAL HIGH (ref 70–99)
Glucose-Capillary: 254 mg/dL — ABNORMAL HIGH (ref 70–99)
Glucose-Capillary: 192 mg/dL — ABNORMAL HIGH (ref 70–99)

## 2021-07-22 LAB — PROTIME-INR
INR: 1.3 — ABNORMAL HIGH (ref 0.8–1.2)
Prothrombin Time: 16.5 seconds — ABNORMAL HIGH (ref 11.4–15.2)

## 2021-07-22 LAB — SEDIMENTATION RATE: Sed Rate: 20 mm/hr — ABNORMAL HIGH (ref 0–16)

## 2021-07-22 LAB — TROPONIN I (HIGH SENSITIVITY)
Troponin I (High Sensitivity): 52 ng/L — ABNORMAL HIGH (ref <18)
Troponin I (High Sensitivity): 44 ng/L — ABNORMAL HIGH (ref <18)

## 2021-07-22 LAB — MRSA NEXT GEN BY PCR, NASAL: MRSA by PCR Next Gen: DETECTED — AB

## 2021-07-22 LAB — LACTIC ACID, PLASMA
Lactic Acid, Venous: 3.2 mmol/L (ref 0.5–1.9)
Lactic Acid, Venous: 3 mmol/L (ref 0.5–1.9)
Lactic Acid, Venous: 3.1 mmol/L (ref 0.5–1.9)
Lactic Acid, Venous: 2.5 mmol/L (ref 0.5–1.9)

## 2021-07-22 LAB — HEMOGLOBIN A1C
Hgb A1c MFr Bld: 13.8 % — ABNORMAL HIGH (ref 4.8–5.6)
Mean Plasma Glucose: 349.36 mg/dL

## 2021-07-22 LAB — SURGICAL PCR SCREEN
MRSA, PCR: POSITIVE — AB
Staphylococcus aureus: POSITIVE — AB

## 2021-07-22 LAB — HIV ANTIBODY (ROUTINE TESTING W REFLEX): HIV Screen 4th Generation wRfx: NONREACTIVE

## 2021-07-22 LAB — PROCALCITONIN: Procalcitonin: 21.9 ng/mL

## 2021-07-22 IMAGING — CT CT FOOT*L* W/O CM
1 series · 12 of 14 positions shown, 15 images · non-contrast
Comparison: None.

CLINICAL DATA: Left foot infection

EXAM:
CT OF THE LEFT FOOT WITHOUT CONTRAST
TECHNIQUE: Multidetector CT imaging of the left foot was performed according to
the standard protocol. Multiplanar CT image reconstructions were
also generated.

[Series 4: extremity soft tissue · axial · 0.49mm/px · z∈[+15,+197]mm · 12 of 109 slices shown, 15 images]
[im 9/109  soft-tissue]
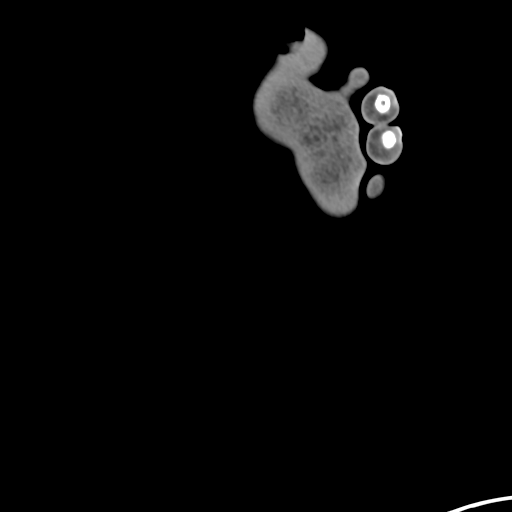
[im 9/109  bone]
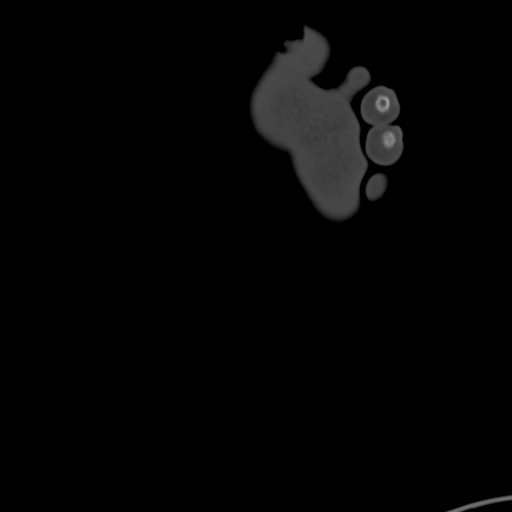
[im 17/109  bone]
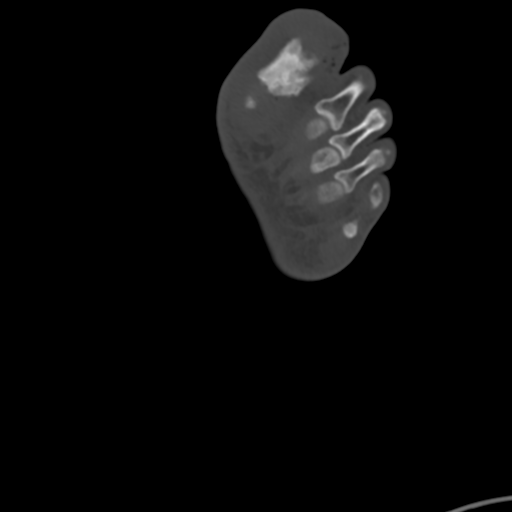
[im 25/109  bone]
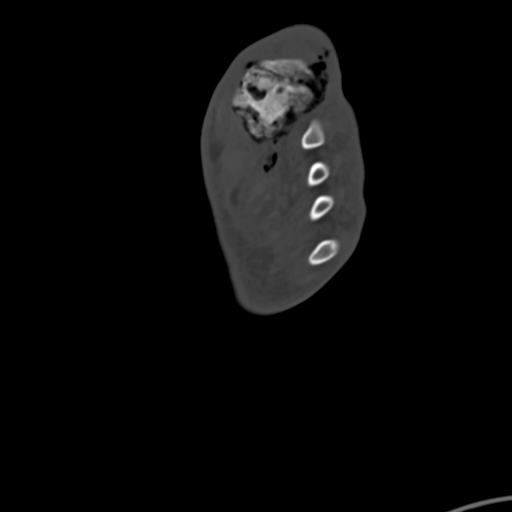
[im 34/109  bone]
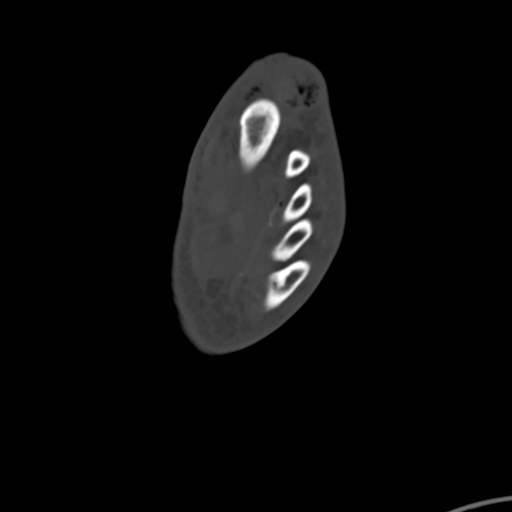
[im 42/109  soft-tissue]
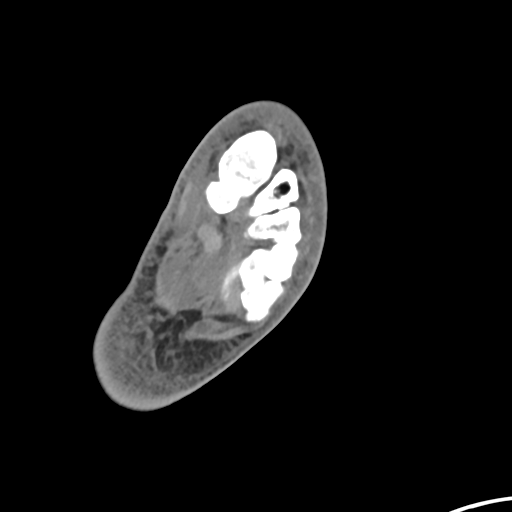
[im 42/109  bone]
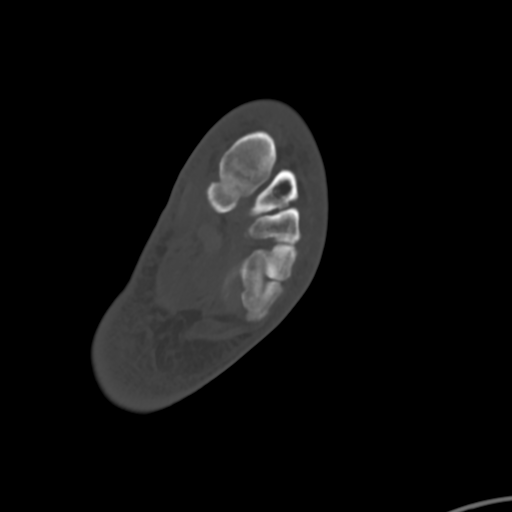
[im 50/109  bone]
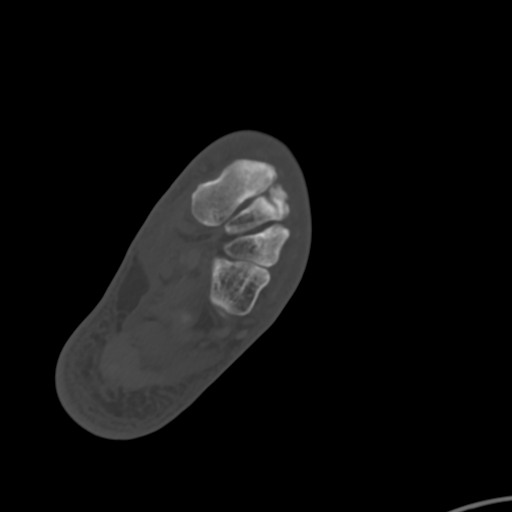
[im 59/109  bone]
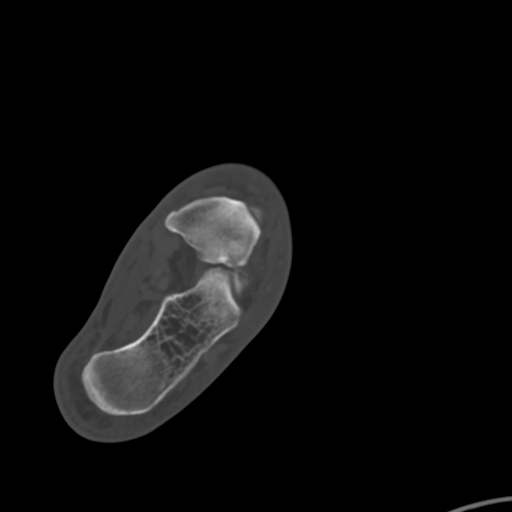
[im 67/109  bone]
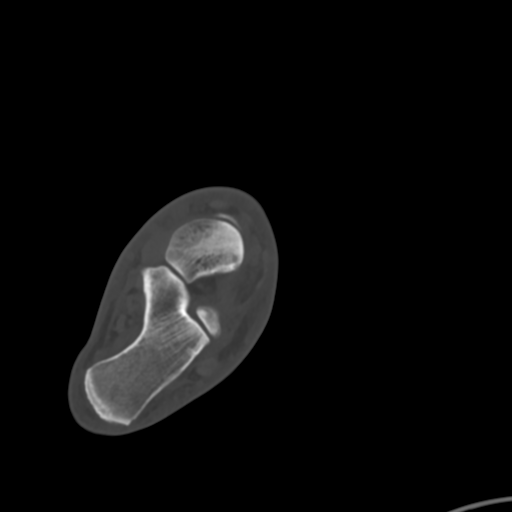
[im 75/109  soft-tissue]
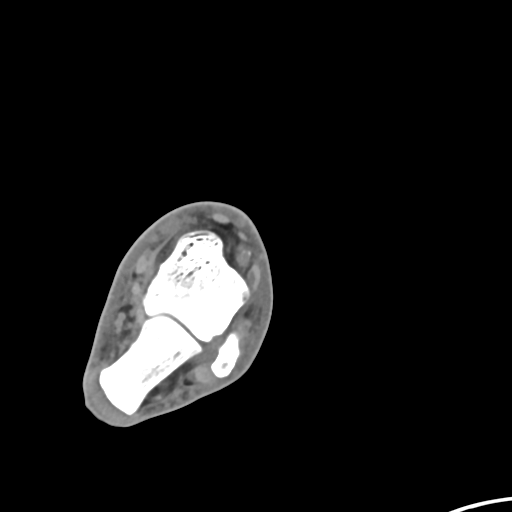
[im 75/109  bone]
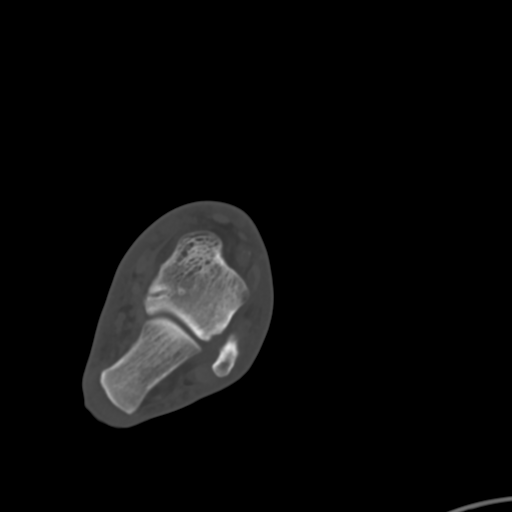
[im 84/109  bone]
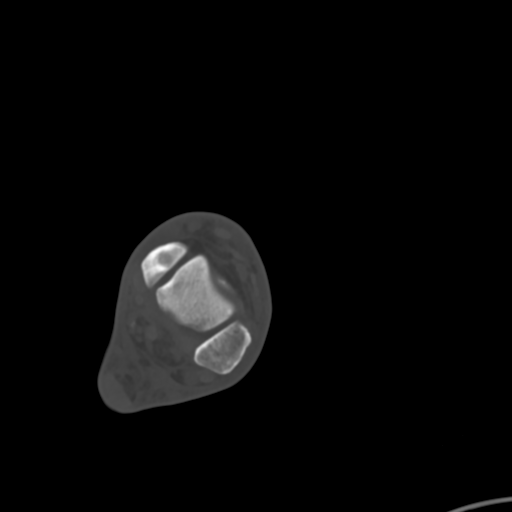
[im 92/109  bone]
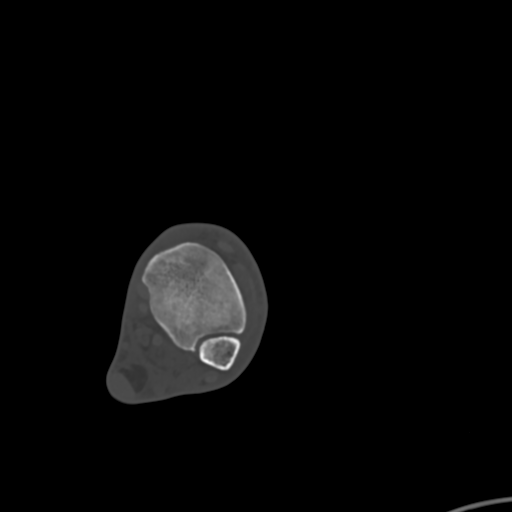
[im 100/109  bone]
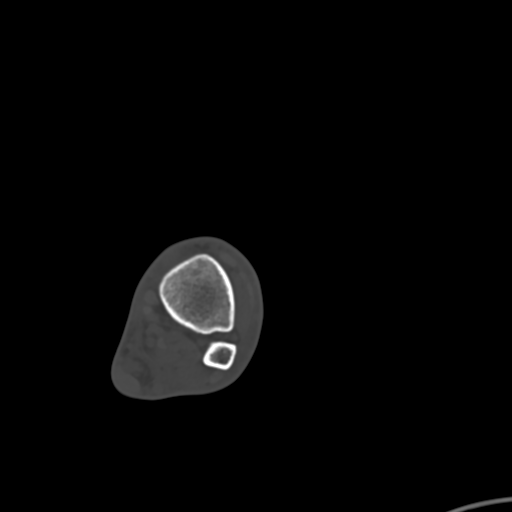

[12 of 14 positions shown; findings below may reference images not displayed]

FINDINGS: Bones/Joint/Cartilage

Transmetatarsal amputation of the first and second digits of the
left foot are noted. Intra osseous gas and cortical erosion is seen
involving the base of the residual proximal phalanx, metatarsal
head, and sesamoid bones of the great toe in keeping with changes of
osteomyelitis. Superimposed advanced degenerative arthritis of the
first MTP joint. Remaining joint spaces are preserved. No acute
fracture or dislocation. No additional sites of osseous erosion
identified.

Ligaments

Suboptimally assessed by CT.

Muscles and Tendons

Unremarkable

Soft tissues

There is extensive soft tissue swelling involving the residual great
toe with extensive subcutaneous gas surrounding the first MTP joint
and extending medially and laterally into the plantar soft tissues
subjacent to the second metatarsal. Moderate subcutaneous edema of
the left foot and ankle. No discrete drainable subcutaneous fluid
collection identified. Plantar ulcer noted within the residual great
toe.
IMPRESSION: Extensive subcutaneous gas in keeping with aggressive soft tissue
infection and erosive changes surrounding the first MTP joint in
keeping with septic arthritis/osteomyelitis in this location. No
discrete drainable fluid collection identified.

## 2021-07-22 MED ORDER — LACTATED RINGERS IV SOLN: INTRAVENOUS | Status: AC

## 2021-07-22 MED ORDER — ACETAMINOPHEN 650 MG RE SUPP: 650.0000 mg | Freq: Four times a day (QID) | RECTAL | Status: DC | PRN

## 2021-07-22 MED ORDER — SODIUM CHLORIDE 0.9 % IV SOLN
250.0000 mL | INTRAVENOUS | Status: DC
  Administered 2021-08-13 – 2021-08-28 (×2): 250 mL via INTRAVENOUS

## 2021-07-22 MED ORDER — DEXTROSE 50 % IV SOLN: 0.0000 mL | INTRAVENOUS | Status: DC | PRN

## 2021-07-22 MED ORDER — HEPARIN BOLUS VIA INFUSION
5000.0000 [IU] | Freq: Once | INTRAVENOUS | Status: AC
  Administered 2021-07-22: 5000 [IU] via INTRAVENOUS
  Filled 2021-07-22: qty 5000

## 2021-07-22 MED ORDER — INSULIN DETEMIR 100 UNIT/ML ~~LOC~~ SOLN
25.0000 [IU] | Freq: Every day | SUBCUTANEOUS | Status: DC
  Administered 2021-07-22: 25 [IU] via SUBCUTANEOUS
  Filled 2021-07-22: qty 0.25

## 2021-07-22 MED ORDER — OXYCODONE HCL 5 MG PO TABS
10.0000 mg | ORAL_TABLET | ORAL | Status: DC | PRN
  Administered 2021-07-22 – 2021-07-23 (×6): 10 mg via ORAL
  Filled 2021-07-22 (×6): qty 2

## 2021-07-22 MED ORDER — INSULIN ASPART 100 UNIT/ML IJ SOLN
0.0000 [IU] | Freq: Every day | INTRAMUSCULAR | Status: DC
  Administered 2021-07-23: 5 [IU] via SUBCUTANEOUS

## 2021-07-22 MED ORDER — DEXTROSE IN LACTATED RINGERS 5 % IV SOLN: INTRAVENOUS | Status: DC

## 2021-07-22 MED ORDER — LACTATED RINGERS IV BOLUS
1000.0000 mL | Freq: Once | INTRAVENOUS | Status: AC
  Administered 2021-07-22: 1000 mL via INTRAVENOUS

## 2021-07-22 MED ORDER — CLINDAMYCIN PHOSPHATE 600 MG/50ML IV SOLN
600.0000 mg | Freq: Three times a day (TID) | INTRAVENOUS | Status: DC
  Administered 2021-07-22 (×2): 600 mg via INTRAVENOUS
  Filled 2021-07-22 (×3): qty 50

## 2021-07-22 MED ORDER — INSULIN REGULAR(HUMAN) IN NACL 100-0.9 UT/100ML-% IV SOLN
INTRAVENOUS | Status: DC
  Administered 2021-07-22: 6 [IU]/h via INTRAVENOUS
  Administered 2021-07-22: 14 [IU]/h via INTRAVENOUS
  Filled 2021-07-22 (×2): qty 100

## 2021-07-22 MED ORDER — FENTANYL CITRATE PF 50 MCG/ML IJ SOSY
12.5000 ug | PREFILLED_SYRINGE | INTRAMUSCULAR | Status: DC | PRN
Start: 2021-07-22 — End: 2021-07-22
  Administered 2021-07-22: 25 ug via INTRAVENOUS
  Filled 2021-07-22: qty 1

## 2021-07-22 MED ORDER — HEPARIN (PORCINE) 25000 UT/250ML-% IV SOLN
1700.0000 [IU]/h | INTRAVENOUS | Status: DC
  Administered 2021-07-22: 1700 [IU]/h via INTRAVENOUS
  Filled 2021-07-22: qty 250

## 2021-07-22 MED ORDER — INSULIN ASPART 100 UNIT/ML IJ SOLN
0.0000 [IU] | Freq: Three times a day (TID) | INTRAMUSCULAR | Status: DC
Start: 2021-07-22 — End: 2021-07-23
  Administered 2021-07-23: 3 [IU] via SUBCUTANEOUS
  Administered 2021-07-23: 15 [IU] via SUBCUTANEOUS
  Administered 2021-07-23: 3 [IU] via SUBCUTANEOUS

## 2021-07-22 MED ORDER — LINEZOLID 600 MG/300ML IV SOLN
600.0000 mg | Freq: Two times a day (BID) | INTRAVENOUS | Status: DC
  Administered 2021-07-22: 600 mg via INTRAVENOUS
  Filled 2021-07-22 (×3): qty 300

## 2021-07-22 MED ORDER — PIPERACILLIN-TAZOBACTAM 3.375 G IVPB
3.3750 g | Freq: Three times a day (TID) | INTRAVENOUS | Status: DC
  Administered 2021-07-22 – 2021-07-23 (×4): 3.375 g via INTRAVENOUS
  Filled 2021-07-22 (×4): qty 50

## 2021-07-22 MED ORDER — HEPARIN (PORCINE) 25000 UT/250ML-% IV SOLN
2000.0000 [IU]/h | INTRAVENOUS | Status: DC
  Administered 2021-07-22: 2000 [IU]/h via INTRAVENOUS
  Filled 2021-07-22: qty 250

## 2021-07-22 MED ORDER — PIPERACILLIN-TAZOBACTAM 3.375 G IVPB 30 MIN
3.3750 g | INTRAVENOUS | Status: AC
  Administered 2021-07-22: 3.375 g via INTRAVENOUS
  Filled 2021-07-22: qty 50

## 2021-07-22 MED ORDER — HEPARIN BOLUS VIA INFUSION
2000.0000 [IU] | Freq: Once | INTRAVENOUS | Status: AC
  Administered 2021-07-22: 2000 [IU] via INTRAVENOUS
  Filled 2021-07-22: qty 2000

## 2021-07-22 MED ORDER — ACETAMINOPHEN 325 MG PO TABS
650.0000 mg | ORAL_TABLET | Freq: Four times a day (QID) | ORAL | Status: DC | PRN
  Administered 2021-07-23: 650 mg via ORAL
  Filled 2021-07-22: qty 2

## 2021-07-22 MED ORDER — HYDROCORTISONE SOD SUC (PF) 100 MG IJ SOLR
100.0000 mg | Freq: Two times a day (BID) | INTRAMUSCULAR | Status: DC
  Administered 2021-07-22 – 2021-07-23 (×2): 100 mg via INTRAVENOUS
  Filled 2021-07-22 (×2): qty 2

## 2021-07-22 MED ORDER — ONDANSETRON HCL 4 MG PO TABS: 4.0000 mg | ORAL_TABLET | Freq: Four times a day (QID) | ORAL | Status: DC | PRN

## 2021-07-22 MED ORDER — SODIUM CHLORIDE 0.9 % IV BOLUS
1000.0000 mL | Freq: Once | INTRAVENOUS | Status: AC
  Administered 2021-07-22: 1000 mL via INTRAVENOUS

## 2021-07-22 MED ORDER — POTASSIUM CHLORIDE 10 MEQ/100ML IV SOLN
10.0000 meq | INTRAVENOUS | Status: AC
  Administered 2021-07-22: 10 meq via INTRAVENOUS
  Filled 2021-07-22 (×2): qty 100

## 2021-07-22 MED ORDER — NOREPINEPHRINE 4 MG/250ML-% IV SOLN
2.0000 ug/min | INTRAVENOUS | Status: DC
  Administered 2021-07-22: 2 ug/min via INTRAVENOUS
  Filled 2021-07-22: qty 250

## 2021-07-22 MED ORDER — CHLORHEXIDINE GLUCONATE CLOTH 2 % EX PADS
6.0000 | MEDICATED_PAD | Freq: Every day | CUTANEOUS | Status: DC
  Administered 2021-07-22 – 2021-07-25 (×2): 6 via TOPICAL

## 2021-07-22 MED ORDER — METOPROLOL TARTRATE 5 MG/5ML IV SOLN
5.0000 mg | Freq: Four times a day (QID) | INTRAVENOUS | Status: DC
  Administered 2021-07-22: 5 mg via INTRAVENOUS
  Filled 2021-07-22: qty 5

## 2021-07-22 MED ORDER — ONDANSETRON HCL 4 MG/2ML IJ SOLN: 4.0000 mg | Freq: Four times a day (QID) | INTRAMUSCULAR | Status: DC | PRN

## 2021-07-22 MED ORDER — KETAMINE BOLUS VIA INFUSION
0.5000 mg/kg | Freq: Once | INTRAVENOUS | Status: DC
Start: 2021-07-22 — End: 2021-07-22

## 2021-07-22 MED ORDER — TRANEXAMIC ACID 1000 MG/10ML IV SOLN
2000.0000 mg | INTRAVENOUS | Status: DC
  Filled 2021-07-22: qty 20

## 2021-07-22 MED ORDER — INSULIN GLARGINE-YFGN 100 UNIT/ML ~~LOC~~ SOLN
15.0000 [IU] | Freq: Every day | SUBCUTANEOUS | Status: AC
  Administered 2021-07-22: 15 [IU] via SUBCUTANEOUS
  Filled 2021-07-22: qty 0.15

## 2021-07-22 MED ORDER — LACTATED RINGERS IV SOLN: INTRAVENOUS | Status: DC

## 2021-07-22 MED ORDER — FENTANYL CITRATE (PF) 100 MCG/2ML IJ SOLN
12.5000 ug | INTRAMUSCULAR | Status: DC | PRN
  Administered 2021-07-23: 50 ug via INTRAVENOUS
  Filled 2021-07-22: qty 2

## 2021-07-22 NOTE — H&P (View-Only) (Signed)
NAME:  [name], MRN:  [id number], DOB:  April 07, 1966, LOS: 0 ADMISSION DATE:  07/21/2021, CONSULTATION DATE:  07/22/2021 REFERRING MD:  Broadus [name], CHIEF COMPLAINT:  Septic shock   History of Present Illness:  [name] is a 55 y.o. M who presented with concern of left leg infection. He states that about 5 days ago he stepped on an unknown object which he feels caused a wound to the area of previous L great toe amputation site resulting in an ulcer that over the last 3 days has become more purulent, erythematous, drainage, and with worsened pain and swelling. He has had shortness of breath, fatigue, and chills. He has been receiving fluid resuscitation to this point without appropriate hemodynamic response. PCCM was consulted for transfer and pressor support.   Pertinent  Medical History  poorly controlled type 2 diabetes mellitus, diabetic foot infection with prior right BKA, chronic diastolic CHF, hypertension, dyslipidemia  Significant Hospital Events: Including procedures, antibiotic start and stop dates in addition to other pertinent events   11/14 Admitted  Interim History / Subjective:  Patient describes feeling better since admission however feeling generally poorly still. His new back pain is what is bothering him most at this time.  Objective   Blood pressure (!) 82/43, pulse (!) 147, temperature 98.4 F (36.9 C), temperature source Oral, resp. rate (!) 32, height 6\' 3"  (1.905 m), weight 108.9 kg, SpO2 95 %.        Intake/Output Summary (Last 24 hours) at 07/22/2021 1310 Last data filed at 07/22/2021 1228 Gross per 24 hour  Intake 5330 ml  Output 400 ml  Net 4930 ml   Filed Weights   07/22/21 0015  Weight: 108.9 kg    Examination: Constitutional: Chronically ill appearing gentleman in acute distress. Cardio: Tachycardic with regular rhythm. No murmurs, rubs, gallops. Pulm: Clear to auscultation bilaterally.  Abdomen: Soft, non-tender, non-distended. MSK: S/p  R BKA. L foot with plantar wound that probes 1.5 cm. Skin: Skin is cool and dry with the exception of surrounding diabetic foot wound on L foot which is warm and erythematous with single bullae and peeling skin. Neuro: Alert and oriented x3. No focal deficit noted. Psych: Flat mood.       Resolved Hospital Problem list   A. Fib with RVR DKA  Assessment & Plan:  Septic shock Diabetic foot infection Strep agalactiae bacteremia Patient states that 5 days ago he stepped on something and the appearance of his L foot has since quickly declined. The plantar first joint wound probes 1.5 cm and wounds on dorsal aspect of foot do not probe. Skin is erythemetous and warm with blistering on dorsal aspect of foot. CT foot concerning for extensive subcutaneous gas with aggressive soft tissue infection and erosive changes surrounding the first MTP joint. S/p 4.5 L LR bolus. Lactate of 3.0 at 0645. Procalcitonin 21.90 concerning for sever bacterial sepsis or septic shock. CRP 23.2. Sed rate 20.  Blood cultures growing Strep agalactiae. - S/p vancomycin 2000 mg, zosyn 3.375 g - S/p ceftriaxone 2 g - Continue zosyn 3.375 g IV for 10 days - Continue clindamycin 600 mg IV - Levophed titrated for MAP goal > 65 - Continue LR 100 mL/hr - Hyrdocortisone 100 mg q12h - Repeat lactate - Consult ID  Back pain, severe Patient explains that he is followed for his chronic low back pain as an outpatient however he is having new onset severe mid-back pain. - F/u MRI thoracic, lumbar spine  AKI on CKD IIIa  Septic ATN Hyponatremia Baseline Cr appears to be closer to 1.80 (05/2021) and is measuring at 2.56 at this time. Patient is s/p 4.5 L LR fluid resuscitation.   Uncontrolled diabetes mellitus DKA, resolved Patient endorses recently uncontrolled BG levels. HbA1c of 13.8%. On admission he appeared to be in mild DKA however this has resolved with insulin drip and fluid resuscitation.  - Stop Levemir 25 units  daily - SSI - CBG q4h  CHF Patient does not appear to be grossly volume overloaded. We will avoid diuresing him given his persistent hypotension unresponsive to fluid resuscitation.   Best Practice (right click and "Reselect all SmartList Selections" daily)   Diet/type: full liquids  DVT prophylaxis: not indicated GI prophylaxis: N/A Lines: N/A Foley:  N/A Code Status:  full code Last date of multidisciplinary goals of care discussion [11/15]  Labs   CBC: Recent Labs  Lab 07/21/21 2003 07/22/21 0020 07/22/21 0021 07/22/21 0350  WBC 13.0*  --   --  11.0*  NEUTROABS 12.2*  --   --   --   HGB 17.9* 16.0 16.3 15.7  HCT 53.2* 47.0 48.0 46.3  MCV 84.4  --   --  85.3  PLT 141*  --   --  108*    Basic Metabolic Panel: Recent Labs  Lab 07/21/21 2003 07/22/21 0020 07/22/21 0021 07/22/21 0059 07/22/21 0350 07/22/21 0920  NA 123* 124* 124* 121* 125* 126*  K 4.7 4.4 4.4 4.0 4.1 3.9  CL 87*  --  92* 90* 93* 91*  CO2 18*  --   --  16* 17* 23  GLUCOSE 516*  --  459* 453* 390* 180*  BUN 40*  --  41* 42* 43* 45*  CREATININE 2.76*  --  2.30* 2.32* 2.32* 2.56*  CALCIUM 8.6*  --   --  7.9* 8.0* 8.0*   GFR: Estimated Creatinine Clearance: 43.5 mL/min (A) (by C-G formula based on SCr of 2.56 mg/dL (H)). Recent Labs  Lab 07/21/21 2003 07/21/21 2257 07/22/21 0300 07/22/21 0350 07/22/21 0645  PROCALCITON  --   --   --  21.90  --   WBC 13.0*  --   --  11.0*  --   LATICACIDVEN 4.0* 2.9* 3.2*  --  3.0*    Liver Function Tests: Recent Labs  Lab 07/21/21 2003  AST 28  ALT 40  ALKPHOS 242*  BILITOT 1.0  PROT 5.6*  ALBUMIN 2.0*   No results for input(s): LIPASE, AMYLASE in the last 168 hours. No results for input(s): AMMONIA in the last 168 hours.  ABG    Component Value Date/Time   HCO3 21.5 07/22/2021 0020   TCO2 21 (L) 07/22/2021 0021   ACIDBASEDEF 4.0 (H) 07/22/2021 0020   O2SAT 43.0 07/22/2021 0020     Coagulation Profile: Recent Labs  Lab 07/21/21 2003  07/22/21 0350  INR 1.4* 1.3*    Cardiac Enzymes: No results for input(s): CKTOTAL, CKMB, CKMBINDEX, TROPONINI in the last 168 hours.  HbA1C: Hemoglobin A1C  Date/Time Value Ref Range Status  10/11/2019 02:14 PM 8.6 (A) 4.0 - 5.6 % Final   Hgb A1c MFr Bld  Date/Time Value Ref Range Status  07/22/2021 12:04 AM 13.8 (H) 4.8 - 5.6 % Final    Comment:    (NOTE) Pre diabetes:          5.7%-6.4%  Diabetes:              >6.4%  Glycemic control for   <7.0% adults with  diabetes     CBG: Recent Labs  Lab 07/22/21 0830 07/22/21 0937 07/22/21 1036 07/22/21 1143 07/22/21 1245  GLUCAP 208* 187* 181* 175* 164*    Review of Systems:   Review of Systems  Constitutional:  Positive for chills, diaphoresis and malaise/fatigue. Negative for fever.  Respiratory:  Negative for cough and shortness of breath.   Cardiovascular:  Positive for palpitations. Negative for chest pain.  Musculoskeletal:  Positive for back pain.    Past Medical History:  He,  has a past medical history of Chronic diastolic CHF (congestive heart failure) ([place]), Diabetes mellitus without complication ([place]), and Hypertension.   Surgical History:   Past Surgical History:  Procedure Laterality Date   BELOW KNEE LEG AMPUTATION Right      Social History:   reports that he has never smoked. He has never used smokeless tobacco. He reports that he does not drink alcohol and does not use drugs.   Family History:  His family history includes Anxiety disorder in his mother; Cancer in his father; Coronary artery disease in his father.   Allergies Allergies  Allergen Reactions   Bactrim [Sulfamethoxazole-Trimethoprim]    Ceprotin [Protein C Concentrate (Human)]    Ciprofloxacin Other (See Comments)    Kidney function   Levaquin [Levofloxacin]      Home Medications  Prior to Admission medications   Medication Sig Start Date End Date Taking? Authorizing Provider  furosemide (LASIX) 40 MG tablet Take 40 mg  once daily for a weight under 240 lbs. For a weight over 240 lbs, take 80 mg (2 tablets) Patient taking differently: Take 40-80 mg by mouth daily. . For a weight over 240 lbs, take 80 mg (2 tablets) 05/16/21  Yes Croitoru, Mihai, MD  ibuprofen (ADVIL) 200 MG tablet Take 600-800 mg by mouth every 6 (six) hours as needed for headache or moderate pain.   Yes [provider]  insulin NPH Human (NOVOLIN N) 100 UNIT/ML injection Inject 0.15 mLs (15 Units total) into the skin at bedtime. Patient taking differently: Inject 20 Units into the skin See admin instructions. 3-4 times daily 10/12/19  Yes Shamleffer, Melanie Crazier, MD  lisinopril (ZESTRIL) 10 MG tablet Take 1 tablet (10 mg total) by mouth daily. 05/16/21 08/14/21 Yes Croitoru, Mihai, MD  Omega 3 1000 MG CAPS Take 1,000 mg by mouth daily.   Yes [provider]  Oxycodone HCl 10 MG TABS Take 10 mg by mouth 3 (three) times daily as needed (pain). 07/20/19  Yes [provider]  potassium chloride SA (KLOR-CON) 20 MEQ tablet Take 1 tablet (20 mEq total) by mouth daily. 04/24/21  Yes Croitoru, Mihai, MD  sildenafil (VIAGRA) 100 MG tablet Take 100 mg by mouth daily as needed for erectile dysfunction. 02/05/21  Yes [provider]  metoprolol tartrate (LOPRESSOR) 100 MG tablet Take one tablet two hours before the test Patient not taking: No sig reported 05/16/21   Croitoru, Mihai, MD  sildenafil (VIAGRA) 50 MG tablet Take 1 tablet (50 mg total) by mouth daily as needed for erectile dysfunction. Patient not taking: Reported on 07/21/2021 09/25/19   Libby Maw, MD     Critical care time: 45 minutes

## 2021-07-22 NOTE — Progress Notes (Signed)
ANTICOAGULATION CONSULT NOTE - Initial Consult  Pharmacy Consult for heparin Indication:  r/o PE  Allergies  Allergen Reactions   Bactrim [Sulfamethoxazole-Trimethoprim]    Ceprotin [Protein C Concentrate (Human)]    Ciprofloxacin Other (See Comments)    Kidney function   Levaquin [Levofloxacin]     Patient Measurements: Height: 6\' 3"  (190.5 cm) Weight: 108.9 kg (240 lb) (Sept 2022 records) IBW/kg (Calculated) : 84.5  Vital Signs: Temp: 97.5 F (36.4 C) (11/14 1958) Temp Source: Tympanic (11/14 1958) BP: 113/61 (11/15 0030) Pulse Rate: 137 (11/15 0030)  Labs: Recent Labs    07/21/21 2003 07/22/21 0020 07/22/21 0021  HGB 17.9* 16.0 16.3  HCT 53.2* 47.0 48.0  PLT 141*  --   --   APTT 24  --   --   LABPROT 16.8*  --   --   INR 1.4*  --   --   CREATININE 2.76*  --  2.30*    Estimated Creatinine Clearance: 48.4 mL/min (A) (by C-G formula based on SCr of 2.3 mg/dL (H)).   Medical History: Past Medical History:  Diagnosis Date   Diabetes mellitus without complication ([place])    Hypertension     Assessment: 55yo male c/o L foot infection, tachypneic on arrival, started on ABX for sepsis, remains tachypneic/tachycardic despite sepsis resuscitation >> concern for PE, to begin heparin.  Goal of Therapy:  Heparin level 0.3-0.7 units/ml Monitor platelets by anticoagulation protocol: Yes   Plan:  Heparin 5000 units IV bolus x1 followed by infusion at 1700 units/hr and monitor heparin levels and CBC.  Wynona Neat, PharmD, BCPS  07/22/2021,12:44 AM

## 2021-07-22 NOTE — Progress Notes (Addendum)
PROGRESS NOTE    [name]  YSA:[id number] DOB: December 01, 1965 DOA: 07/21/2021 PCP: Patient, No Pcp Per (Inactive)  Brief Narrative: 55/M with poorly controlled type 2 diabetes mellitus, diabetic foot infection with prior right BKA, chronic diastolic CHF, hypertension, dyslipidemia presented to the ED with 1 week of progressive discoloration and swelling pain and bleeding of left great toe and left foot, progressively worsened in the past 48 hours with purulent drainage, skin sloughing and bleeding. -In addition also reports 3 to 4-day history of severe upper back pain -In the emergency room he was noted to be hypotensive, tachycardic with A. fib RVR heart rate in the 140-150 range, CT noted osteomyelitis of first MTP and soft tissue gas concerning for possible necrotizing fasciitis, also noted to have worsening AKI, creatinine was 2.7, mild DKA   Assessment & Plan:   Severe sepsis  (HCC)-poa Septic shock Diabetic foot infection Necrotizing fasciitis -Continue IV fluids, received 4 L in the ED so far -Continue IV vancomycin, Zosyn and clindamycin -Orthopedics consulting, plan for BKA tomorrow -Follow-up blood cultures -If BP worsens again will request ICU admission for pressors  A. fib RVR -Heart rate primarily driven by severe sepsis from above -Add low-dose IV metoprolol if blood pressure tolerates -Cardiology following as well, obtain echo when heart rate improved -Remains on IV heparin at this time, hold at 12 AM for OR tomorrow  Severe mid back pain -Follow-up MRI thoracic, lumbar spine -r/o discitis  AKI on CKD 3 a -Baseline creatinine is 1.8 in September, creatinine 2.7 on admission the setting of severe sepsis -Hold lisinopril, continue IV fluids today  Diabetic ketoacidosis Uncontrolled type 2 diabetes mellitus -Mild DKA, primarily metabolic acidosis driven by AKI and sepsis -Beta hydroxybutyrate acid was elevated,  -Anion gap has corrected, will transition off  insulin drip to Semglee and sliding scale insulin  Chronic diastolic CHF -Not volume overloaded at this time, continue fluids for severe sepsis -Clinically do not suspect PE, symptoms are secondary to severe sepsis, discontinue VQ scan -Will remain on anticoagulation for A. fib today, hold at this tonight at midnight for surgery tomorrow  DVT prophylaxis: Heparin Code Status: Full code Family Communication: No family at bedside, will update son Disposition Plan:  Status is: Inpatient  Remains inpatient appropriate because: Severity of illness  Consultants:  Cardiology, orthopedics  Procedures:   Antimicrobials:    Subjective: -Feels bad, hurting all over, severe foot pain  Objective: Vitals:   07/22/21 0930 07/22/21 0931 07/22/21 0936 07/22/21 1115  BP: (!) 88/73 (!) 92/56 (!) 95/54 97/65  Pulse: (!) 151 (!) 158 (!) 151 (!) 146  Resp: (!) 25 (!) 38 (!) 42 (!) 41  Temp:      TempSrc:      SpO2: (!) 89% 96% 95% 92%  Weight:      Height:        Intake/Output Summary (Last 24 hours) at 07/22/2021 1150 Last data filed at 07/22/2021 0456 Gross per 24 hour  Intake 5330 ml  Output --  Net 5330 ml   Filed Weights   07/22/21 0015  Weight: 108.9 kg    Examination:  General exam: Ill-appearing male laying in bed, AAOx3, tachypneic CVS: S1-S2, irregularly irregular rhythm, tachycardic Lungs: Poor air movement bilaterally Abdomen: Soft, nontender, bowel sounds present Extremities: Left foot great toe black necrotic with sloughing skin and drainage, tenderness extending to dorsum of foot  Psychiatry: Flat affect  Data Reviewed:   CBC: Recent Labs  Lab 07/21/21 2003 07/22/21 0020 07/22/21  0021 07/22/21 0350  WBC 13.0*  --   --  11.0*  NEUTROABS 12.2*  --   --   --   HGB 17.9* 16.0 16.3 15.7  HCT 53.2* 47.0 48.0 46.3  MCV 84.4  --   --  85.3  PLT 141*  --   --  625*   Basic Metabolic Panel: Recent Labs  Lab 07/21/21 2003 07/22/21 0020 07/22/21 0021  07/22/21 0059 07/22/21 0350 07/22/21 0920  NA 123* 124* 124* 121* 125* 126*  K 4.7 4.4 4.4 4.0 4.1 3.9  CL 87*  --  92* 90* 93* 91*  CO2 18*  --   --  16* 17* 23  GLUCOSE 516*  --  459* 453* 390* 180*  BUN 40*  --  41* 42* 43* 45*  CREATININE 2.76*  --  2.30* 2.32* 2.32* 2.56*  CALCIUM 8.6*  --   --  7.9* 8.0* 8.0*   GFR: Estimated Creatinine Clearance: 43.5 mL/min (A) (by C-G formula based on SCr of 2.56 mg/dL (H)). Liver Function Tests: Recent Labs  Lab 07/21/21 2003  AST 28  ALT 40  ALKPHOS 242*  BILITOT 1.0  PROT 5.6*  ALBUMIN 2.0*   No results for input(s): LIPASE, AMYLASE in the last 168 hours. No results for input(s): AMMONIA in the last 168 hours. Coagulation Profile: Recent Labs  Lab 07/21/21 2003 07/22/21 0350  INR 1.4* 1.3*   Cardiac Enzymes: No results for input(s): CKTOTAL, CKMB, CKMBINDEX, TROPONINI in the last 168 hours. BNP (last 3 results) No results for input(s): PROBNP in the last 8760 hours. HbA1C: Recent Labs    07/22/21 0004  HGBA1C 13.8*   CBG: Recent Labs  Lab 07/22/21 0610 07/22/21 0724 07/22/21 0830 07/22/21 0937 07/22/21 1036  GLUCAP 296* 231* 208* 187* 181*   Lipid Profile: No results for input(s): CHOL, HDL, LDLCALC, TRIG, CHOLHDL, LDLDIRECT in the last 72 hours. Thyroid Function Tests: No results for input(s): TSH, T4TOTAL, FREET4, T3FREE, THYROIDAB in the last 72 hours. Anemia Panel: No results for input(s): VITAMINB12, FOLATE, FERRITIN, TIBC, IRON, RETICCTPCT in the last 72 hours. Urine analysis:    Component Value Date/Time   COLORURINE YELLOW 07/22/2021 0250   APPEARANCEUR CLOUDY (A) 07/22/2021 0250   LABSPEC 1.018 07/22/2021 0250   PHURINE 5.0 07/22/2021 0250   GLUCOSEU >=500 (A) 07/22/2021 0250   HGBUR SMALL (A) 07/22/2021 0250   BILIRUBINUR NEGATIVE 07/22/2021 0250   KETONESUR 5 (A) 07/22/2021 0250   PROTEINUR 30 (A) 07/22/2021 0250   UROBILINOGEN 0.2 09/13/2019 0906   NITRITE NEGATIVE 07/22/2021 0250    LEUKOCYTESUR TRACE (A) 07/22/2021 0250   Sepsis Labs: @LABRCNTIP (procalcitonin:4,lacticidven:4)  ) Recent Results (from the past 240 hour(s))  Resp Panel by RT-PCR (Flu A&B, Covid) Nasopharyngeal Swab     Status: None   Collection Time: 07/21/21  7:54 PM   Specimen: Nasopharyngeal Swab; Nasopharyngeal(NP) swabs in vial transport medium  Result Value Ref Range Status   SARS Coronavirus 2 by RT PCR NEGATIVE NEGATIVE Final    Comment: (NOTE) SARS-CoV-2 target nucleic acids are NOT DETECTED.  The SARS-CoV-2 RNA is generally detectable in upper respiratory specimens during the acute phase of infection. The lowest concentration of SARS-CoV-2 viral copies this assay can detect is 138 copies/mL. A negative result does not preclude SARS-Cov-2 infection and should not be used as the sole basis for treatment or other patient management decisions. A negative result may occur with  improper specimen collection/handling, submission of specimen other than nasopharyngeal swab, presence of  viral mutation(s) within the areas targeted by this assay, and inadequate number of viral copies(<138 copies/mL). A negative result must be combined with clinical observations, patient history, and epidemiological information. The expected result is Negative.  Fact Sheet for Patients:  EntrepreneurPulse.com.au  Fact Sheet for Healthcare Providers:  IncredibleEmployment.be  This test is no t yet approved or cleared by the [place] FDA and  has been authorized for detection and/or diagnosis of SARS-CoV-2 by FDA under an Emergency Use Authorization (EUA). This EUA will remain  in effect (meaning this test can be used) for the duration of the COVID-19 declaration under Section 564(b)(1) of the Act, 21 U.S.C.section 360bbb-3(b)(1), unless the authorization is terminated  or revoked sooner.       Influenza A by PCR NEGATIVE NEGATIVE Final   Influenza B by PCR NEGATIVE  NEGATIVE Final    Comment: (NOTE) The Xpert Xpress SARS-CoV-2/FLU/RSV plus assay is intended as an aid in the diagnosis of influenza from Nasopharyngeal swab specimens and should not be used as a sole basis for treatment. Nasal washings and aspirates are unacceptable for Xpert Xpress SARS-CoV-2/FLU/RSV testing.  Fact Sheet for Patients: EntrepreneurPulse.com.au  Fact Sheet for Healthcare Providers: IncredibleEmployment.be  This test is not yet approved or cleared by the [place] FDA and has been authorized for detection and/or diagnosis of SARS-CoV-2 by FDA under an Emergency Use Authorization (EUA). This EUA will remain in effect (meaning this test can be used) for the duration of the COVID-19 declaration under Section 564(b)(1) of the Act, 21 U.S.C. section 360bbb-3(b)(1), unless the authorization is terminated or revoked.  Performed at [facility], [place] [address] [place], [place] [address]   Culture, blood (Routine x 2)     Status: None (Preliminary result)   Collection Time: 07/21/21  8:00 PM   Specimen: BLOOD  Result Value Ref Range Status   Specimen Description BLOOD RIGHT ANTECUBITAL  Final   Special Requests   Final    BOTTLES DRAWN AEROBIC AND ANAEROBIC Blood Culture adequate volume   Culture  Setup Time   Final    GRAM POSITIVE COCCI IN CHAINS IN BOTH AEROBIC AND ANAEROBIC BOTTLES Performed at [facility], [place] [address], [place], [place] [address]    Culture GRAM POSITIVE COCCI  Final   Report Status PENDING  Incomplete  Culture, blood (Routine x 2)     Status: None (Preliminary result)   Collection Time: 07/21/21  8:03 PM   Specimen: BLOOD  Result Value Ref Range Status   Specimen Description BLOOD LEFT ARM  Final   Special Requests   Final    BOTTLES DRAWN AEROBIC AND ANAEROBIC Blood Culture adequate volume   Culture  Setup Time   Final    GRAM POSITIVE COCCI IN CHAINS IN BOTH AEROBIC AND ANAEROBIC  BOTTLES CRITICAL RESULT CALLED TO, READ BACK BY AND VERIFIED WITH: PHARMD B BOLDEN [id number] AT 830 AM BY CM Performed at [name], [place] [address], [place],  [address]    Culture GRAM POSITIVE COCCI  Final   Report Status PENDING  Incomplete  Blood Culture ID Panel (Reflexed)     Status: Abnormal   Collection Time: 07/21/21  8:03 PM  Result Value Ref Range Status   Enterococcus faecalis NOT DETECTED NOT DETECTED Final   Enterococcus Faecium NOT DETECTED NOT DETECTED Final   Listeria monocytogenes NOT DETECTED NOT DETECTED Final   Staphylococcus species NOT DETECTED NOT DETECTED Final   Staphylococcus aureus (BCID) NOT  DETECTED NOT DETECTED Final   Staphylococcus epidermidis NOT DETECTED NOT DETECTED Final   Staphylococcus lugdunensis NOT DETECTED NOT DETECTED Final   Streptococcus species DETECTED (A) NOT DETECTED Final    Comment: CRITICAL RESULT CALLED TO, READ BACK BY AND VERIFIED WITH: PHARMD B BOLDEN [id number] AT 923 AM BY CM    Streptococcus agalactiae DETECTED (A) NOT DETECTED Final    Comment: CRITICAL RESULT CALLED TO, READ BACK BY AND VERIFIED WITH: PHARMD B BOLDEN [id number] AT 923 AM BY CM    Streptococcus pneumoniae NOT DETECTED NOT DETECTED Final   Streptococcus pyogenes NOT DETECTED NOT DETECTED Final   A.calcoaceticus-baumannii NOT DETECTED NOT DETECTED Final   Bacteroides fragilis NOT DETECTED NOT DETECTED Final   Enterobacterales NOT DETECTED NOT DETECTED Final   Enterobacter cloacae complex NOT DETECTED NOT DETECTED Final   Escherichia coli NOT DETECTED NOT DETECTED Final   Klebsiella aerogenes NOT DETECTED NOT DETECTED Final   Klebsiella oxytoca NOT DETECTED NOT DETECTED Final   Klebsiella pneumoniae NOT DETECTED NOT DETECTED Final   Proteus species NOT DETECTED NOT DETECTED Final   Salmonella species NOT DETECTED NOT DETECTED Final   Serratia marcescens NOT DETECTED NOT DETECTED Final   Haemophilus influenzae NOT DETECTED NOT DETECTED Final    Neisseria meningitidis NOT DETECTED NOT DETECTED Final   Pseudomonas aeruginosa NOT DETECTED NOT DETECTED Final   Stenotrophomonas maltophilia NOT DETECTED NOT DETECTED Final   Candida albicans NOT DETECTED NOT DETECTED Final   Candida auris NOT DETECTED NOT DETECTED Final   Candida glabrata NOT DETECTED NOT DETECTED Final   Candida krusei NOT DETECTED NOT DETECTED Final   Candida parapsilosis NOT DETECTED NOT DETECTED Final   Candida tropicalis NOT DETECTED NOT DETECTED Final   Cryptococcus neoformans/gattii NOT DETECTED NOT DETECTED Final    Comment: Performed at [facility], [address] [place], [place] [address]         Radiology Studies: DG Chest 1 View  Result Date: 07/22/2021 EXAM: EXAM CHEST  1 VIEW COMPARISON:  Portable AP chest today at 8:08 p.m. FINDINGS: Provided are 2 separate lateral chest radiographs on this patient. There is a trace left pleural effusion but there is no convincing overlying infiltrate. Exaggerated thoracic kyphosis is noted with multilevel thoracic spine degenerative discs. IMPRESSION: Trace left pleural effusion visible on the lateral view, but no definitive adjacent infiltrate. Electronically Signed   By: Telford Nab M.D.   On: 07/22/2021 00:52   CT Foot Left Wo Contrast  Result Date: 07/22/2021 CLINICAL DATA:  Left foot infection EXAM: CT OF THE LEFT FOOT WITHOUT CONTRAST TECHNIQUE: Multidetector CT imaging of the left foot was performed according to the standard protocol. Multiplanar CT image reconstructions were also generated. COMPARISON:  None. FINDINGS: Bones/Joint/Cartilage Transmetatarsal amputation of the first and second digits of the left foot are noted. Intra osseous gas and cortical erosion is seen involving the base of the residual proximal phalanx, metatarsal head, and sesamoid bones of the great toe in keeping with changes of osteomyelitis. Superimposed advanced degenerative arthritis of the first MTP joint. Remaining joint  spaces are preserved. No acute fracture or dislocation. No additional sites of osseous erosion identified. Ligaments Suboptimally assessed by CT. Muscles and Tendons Unremarkable Soft tissues There is extensive soft tissue swelling involving the residual great toe with extensive subcutaneous gas surrounding the first MTP joint and extending medially and laterally into the plantar soft tissues subjacent to the second metatarsal. Moderate subcutaneous edema of the left foot and ankle. No discrete  drainable subcutaneous fluid collection identified. Plantar ulcer noted within the residual great toe. IMPRESSION: Extensive subcutaneous gas in keeping with aggressive soft tissue infection and erosive changes surrounding the first MTP joint in keeping with septic arthritis/osteomyelitis in this location. No discrete drainable fluid collection identified. Electronically Signed   By: Fidela Salisbury M.D.   On: 07/22/2021 02:33   DG Chest Port 1 View  Result Date: 07/21/2021 CLINICAL DATA:  55 year old male with pain. EXAM: PORTABLE CHEST 1 VIEW COMPARISON:  None. FINDINGS: UPPER limits normal heart size noted. Equivocal LEFT LOWER lobe retrocardiac density/atelectasis may be present. There is no other evidence of focal airspace disease, pulmonary edema, suspicious pulmonary nodule/mass, pleural effusion, or pneumothorax. No acute bony abnormalities are identified. IMPRESSION: 1. Equivocal LEFT LOWER lobe retrocardiac density/atelectasis. Recommend LATERAL chest radiograph if there is clinical suspicion for pneumonia. Electronically Signed   By: Margarette Canada M.D.   On: 07/21/2021 20:50   DG Toe Great Left  Result Date: 07/21/2021 CLINICAL DATA:  Left great toe pain, osteomyelitis EXAM: LEFT GREAT TOE COMPARISON:  None. FINDINGS: Three view radiograph left great toe demonstrates resection of the distal phalanx of the second digit and probable trans phalangeal amputation proximal phalanx of the great toe. There is a soft  tissue ulcer involving the distal, medial, plantar aspect the residual left greatq toe. There is subcutaneous gas within the a dorsal lateral soft tissues of the left great toe. There are periarticular lucencies within the a distal phalanx and distal aspect of the first metatarsal medially suspicious for changes of osteomyelitis/septic arthritis. Superimposed moderate degenerative arthritis of the first MTP joint. IMPRESSION: Postsurgical changes as described above. Subcutaneous gas within the dorsal lateral soft tissues at the level of the first MTP joint with subjacent periarticular lucency within the a base of the first proximal phalanx and distal aspect of the first metatarsal suspicious for changes of septic arthritis/osteomyelitis. This could be confirmed with contrast enhanced MRI examination. Electronically Signed   By: Fidela Salisbury M.D.   On: 07/21/2021 20:59        Scheduled Meds:  ketamine  0.5 mg/kg Intravenous Once   metoprolol tartrate  5 mg Intravenous Q6H   vancomycin variable dose per unstable renal function (pharmacist dosing)   Does not apply See admin instructions   Continuous Infusions:  clindamycin (CLEOCIN) IV Stopped (07/22/21 1047)   dextrose 5% lactated ringers 150 mL/hr at 07/22/21 0740   heparin 2,000 Units/hr (07/22/21 1140)   insulin 4 Units/hr (07/22/21 1145)   lactated ringers Stopped (07/22/21 0727)   piperacillin-tazobactam (ZOSYN)  IV Stopped (07/22/21 1051)     LOS: 0 days    Time spent: 74min  Domenic Polite, MD [facility]   07/22/2021, 11:50 AM

## 2021-07-22 NOTE — ED Notes (Signed)
Checked patient cbg it was 67 notified RN [place] of blood sugar patient is resting with call bell in reach

## 2021-07-22 NOTE — Progress Notes (Signed)
PHARMACY - PHYSICIAN COMMUNICATION CRITICAL VALUE ALERT - BLOOD CULTURE IDENTIFICATION (BCID)  [name] is an 55 y.o. male who presented to [facility] on 07/21/2021 with a chief complaint of diabetic foot infection.  Assessment: 55 yo male admitted for diabetic foot infection. He reports 1 week history of left great toe infection at the site of a previous left great toe amputation. Per patient, the wound has progressively worsening purulent drainage, erythema, necrosis, and skin sloughing. Bcx now with 4/4 bottles gram positive cocci in chains, and BCID shows group B strep with no resistance noted. The likely source for this is his foot infection. Planned for additional amputation tomorrow 11/16.   Name of physician (or Provider) Contacted: Dr. Domenic Polite   Current antibiotics: clindamycin, vancomycin, and piperacillin-tazobactam   Changes to prescribed antibiotics recommended:  Continue current antibiotics and reassess therapy following planned amputation tomorrow   Results for orders placed or performed during the hospital encounter of 07/21/21  Blood Culture ID Panel (Reflexed) (Collected: 07/21/2021  8:03 PM)  Result Value Ref Range   Enterococcus faecalis NOT DETECTED NOT DETECTED   Enterococcus Faecium NOT DETECTED NOT DETECTED   Listeria monocytogenes NOT DETECTED NOT DETECTED   Staphylococcus species NOT DETECTED NOT DETECTED   Staphylococcus aureus (BCID) NOT DETECTED NOT DETECTED   Staphylococcus epidermidis NOT DETECTED NOT DETECTED   Staphylococcus lugdunensis NOT DETECTED NOT DETECTED   Streptococcus species DETECTED (A) NOT DETECTED   Streptococcus agalactiae DETECTED (A) NOT DETECTED   Streptococcus pneumoniae NOT DETECTED NOT DETECTED   Streptococcus pyogenes NOT DETECTED NOT DETECTED   A.calcoaceticus-baumannii NOT DETECTED NOT DETECTED   Bacteroides fragilis NOT DETECTED NOT DETECTED   Enterobacterales NOT DETECTED NOT DETECTED   Enterobacter cloacae  complex NOT DETECTED NOT DETECTED   Escherichia coli NOT DETECTED NOT DETECTED   Klebsiella aerogenes NOT DETECTED NOT DETECTED   Klebsiella oxytoca NOT DETECTED NOT DETECTED   Klebsiella pneumoniae NOT DETECTED NOT DETECTED   Proteus species NOT DETECTED NOT DETECTED   Salmonella species NOT DETECTED NOT DETECTED   Serratia marcescens NOT DETECTED NOT DETECTED   Haemophilus influenzae NOT DETECTED NOT DETECTED   Neisseria meningitidis NOT DETECTED NOT DETECTED   Pseudomonas aeruginosa NOT DETECTED NOT DETECTED   Stenotrophomonas maltophilia NOT DETECTED NOT DETECTED   Candida albicans NOT DETECTED NOT DETECTED   Candida auris NOT DETECTED NOT DETECTED   Candida glabrata NOT DETECTED NOT DETECTED   Candida krusei NOT DETECTED NOT DETECTED   Candida parapsilosis NOT DETECTED NOT DETECTED   Candida tropicalis NOT DETECTED NOT DETECTED   Cryptococcus neoformans/gattii NOT DETECTED NOT DETECTED    Zenaida Deed, PharmD PGY1 Acute Care Pharmacy Resident  Phone: [phone] 07/22/2021  9:37 AM  Please check [email or web address] for unit-specific pharmacy phone numbers.

## 2021-07-22 NOTE — ED Notes (Signed)
Reported to Dr. Tacy Learn that Dr. Delene Loll initial plan was to d/c insulin drip two hours after Levemir was given. Per Dr. Tacy Learn, "Lets continue the drip for now, as we are started stress dose steroids, will figure out in ICU."

## 2021-07-22 NOTE — Consult Note (Signed)
Reason for Consult: Left foot infection Referring Physician: Esau Grew, DO  [name] is an 55 y.o. male.  HPI: Orthopedics is consulted for evaluation and management of left foot infection. [name] reports he began to notice an ulceration in his left foot on over the past 3 days or so.  He began to experience worsening pain, purulent discharge, erythema, and necrosis of the skin.  He was found to be septic in the ED today.  His CT scan demonstrated significant osteomyelitis about the left first MTP joint and soft tissue gas that was concerning for potential necrotizing fasciitis. He is an insulin-dependent diabetic right tibia and that he has not been taking his insulin lately.  In the ER today is A1c is 13.8.  He has a history of prior partial hallux amputation on the left side and a BKA on the right.  He is a patient of [name].   Past Medical History:  Diagnosis Date   Chronic diastolic CHF (congestive heart failure) (HCC)    Diabetes mellitus without complication ([place])    Hypertension     Past Surgical History:  Procedure Laterality Date   BELOW KNEE LEG AMPUTATION Right     Family History  Problem Relation Age of Onset   Anxiety disorder Mother    Cancer Father    Coronary artery disease Father     Social History:  reports that he has never smoked. He has never used smokeless tobacco. He reports that he does not drink alcohol and does not use drugs.  Allergies:  Allergies  Allergen Reactions   Bactrim [Sulfamethoxazole-Trimethoprim]    Ceprotin [Protein C Concentrate (Human)]    Ciprofloxacin Other (See Comments)    Kidney function   Levaquin [Levofloxacin]     Medications: I have reviewed the patient's current medications.  Results for orders placed or performed during the hospital encounter of 07/21/21 (from the past 48 hour(s))  Resp Panel by RT-PCR (Flu A&B, Covid) Nasopharyngeal Swab     Status: None   Collection Time: 07/21/21  7:54 PM   Specimen:  Nasopharyngeal Swab; Nasopharyngeal(NP) swabs in vial transport medium  Result Value Ref Range   SARS Coronavirus 2 by RT PCR NEGATIVE NEGATIVE    Comment: (NOTE) SARS-CoV-2 target nucleic acids are NOT DETECTED.  The SARS-CoV-2 RNA is generally detectable in upper respiratory specimens during the acute phase of infection. The lowest concentration of SARS-CoV-2 viral copies this assay can detect is 138 copies/mL. A negative result does not preclude SARS-Cov-2 infection and should not be used as the sole basis for treatment or other patient management decisions. A negative result may occur with  improper specimen collection/handling, submission of specimen other than nasopharyngeal swab, presence of viral mutation(s) within the areas targeted by this assay, and inadequate number of viral copies(<138 copies/mL). A negative result must be combined with clinical observations, patient history, and epidemiological information. The expected result is Negative.  Fact Sheet for Patients:  EntrepreneurPulse.com.au  Fact Sheet for Healthcare Providers:  IncredibleEmployment.be  This test is no t yet approved or cleared by the [place] FDA and  has been authorized for detection and/or diagnosis of SARS-CoV-2 by FDA under an Emergency Use Authorization (EUA). This EUA will remain  in effect (meaning this test can be used) for the duration of the COVID-19 declaration under Section 564(b)(1) of the Act, 21 U.S.C.section 360bbb-3(b)(1), unless the authorization is terminated  or revoked sooner.       Influenza A by PCR NEGATIVE  NEGATIVE   Influenza B by PCR NEGATIVE NEGATIVE    Comment: (NOTE) The Xpert Xpress SARS-CoV-2/FLU/RSV plus assay is intended as an aid in the diagnosis of influenza from Nasopharyngeal swab specimens and should not be used as a sole basis for treatment. Nasal washings and aspirates are unacceptable for Xpert Xpress  SARS-CoV-2/FLU/RSV testing.  Fact Sheet for Patients: EntrepreneurPulse.com.au  Fact Sheet for Healthcare Providers: IncredibleEmployment.be  This test is not yet approved or cleared by the [place] FDA and has been authorized for detection and/or diagnosis of SARS-CoV-2 by FDA under an Emergency Use Authorization (EUA). This EUA will remain in effect (meaning this test can be used) for the duration of the COVID-19 declaration under Section 564(b)(1) of the Act, 21 U.S.C. section 360bbb-3(b)(1), unless the authorization is terminated or revoked.  Performed at [facility], [place] [address] [place], [place] [address]   Comprehensive metabolic panel     Status: Abnormal   Collection Time: 07/21/21  8:03 PM  Result Value Ref Range   Sodium 123 (L) 135 - 145 mmol/L   Potassium 4.7 3.5 - 5.1 mmol/L   Chloride 87 (L) 98 - 111 mmol/L   CO2 18 (L) 22 - 32 mmol/L   Glucose, Bld 516 (HH) 70 - 99 mg/dL    Comment: Glucose reference range applies only to samples taken after fasting for at least 8 hours. CRITICAL RESULT CALLED TO, READ BACK BY AND VERIFIED WITH: [name] TATE RN 07/21/21 2121 M KOROLESKI    BUN 40 (H) 6 - 20 mg/dL   Creatinine, Ser 2.76 (H) 0.61 - 1.24 mg/dL   Calcium 8.6 (L) 8.9 - 10.3 mg/dL   Total Protein 5.6 (L) 6.5 - 8.1 g/dL   Albumin 2.0 (L) 3.5 - 5.0 g/dL   AST 28 15 - 41 U/L   ALT 40 0 - 44 U/L   Alkaline Phosphatase 242 (H) 38 - 126 U/L   Total Bilirubin 1.0 0.3 - 1.2 mg/dL   GFR, Estimated 26 (L) >60 mL/min    Comment: (NOTE) Calculated using the CKD-EPI Creatinine Equation (2021)    Anion gap 18 (H) 5 - 15    Comment: Performed at [facility], [place] [address], [place], [place] [address]  Lactic acid, plasma     Status: Abnormal   Collection Time: 07/21/21  8:03 PM  Result Value Ref Range   Lactic Acid, Venous 4.0 (HH) 0.5 - 1.9 mmol/L    Comment: CRITICAL RESULT CALLED TO, READ BACK BY AND VERIFIED  WITH: [name] TATE RN 07/21/21 2121 [name] Performed at [facility], [place] [address], [place], [place] [address]   CBC with Differential     Status: Abnormal   Collection Time: 07/21/21  8:03 PM  Result Value Ref Range   WBC 13.0 (H) 4.0 - 10.5 K/uL   RBC 6.30 (H) 4.22 - 5.81 MIL/uL   Hemoglobin 17.9 (H) 13.0 - 17.0 g/dL   HCT 53.2 (H) 39.0 - 52.0 %   MCV 84.4 80.0 - 100.0 fL   MCH 28.4 26.0 - 34.0 pg   MCHC 33.6 30.0 - 36.0 g/dL   RDW 14.6 11.5 - 15.5 %   Platelets 141 (L) 150 - 400 K/uL   nRBC 0.0 0.0 - 0.2 %   Neutrophils Relative % 94 %   Neutro Abs 12.2 (H) 1.7 - 7.7 K/uL   Lymphocytes Relative 1 %   Lymphs Abs 0.1 (L) 0.7 - 4.0 K/uL  Monocytes Relative 3 %   Monocytes Absolute 0.4 0.1 - 1.0 K/uL   Eosinophils Relative 0 %   Eosinophils Absolute 0.0 0.0 - 0.5 K/uL   Basophils Relative 0 %   Basophils Absolute 0.0 0.0 - 0.1 K/uL   nRBC 0 0 /100 WBC   Metamyelocytes Relative 2 %   Abs Immature Granulocytes 0.30 (H) 0.00 - 0.07 K/uL    Comment: Performed at [facility] [address], [place], [place] [address]  Protime-INR     Status: Abnormal   Collection Time: 07/21/21  8:03 PM  Result Value Ref Range   Prothrombin Time 16.8 (H) 11.4 - 15.2 seconds   INR 1.4 (H) 0.8 - 1.2    Comment: (NOTE) INR goal varies based on device and disease states. Performed at [facility], [place] [address], [place], [place] [address]   APTT     Status: None   Collection Time: 07/21/21  8:03 PM  Result Value Ref Range   aPTT 24 24 - 36 seconds    Comment: Performed at [facility] [address], [place], [place] [address]  Sedimentation rate     Status: Abnormal   Collection Time: 07/21/21  8:33 PM  Result Value Ref Range   Sed Rate 20 (H) 0 - 16 mm/hr    Comment: Performed at [facility] [address], [place], [place] [address]  C-reactive protein     Status: Abnormal   Collection Time: 07/21/21  8:33 PM  Result Value Ref Range   CRP 23.2  (H) <1.0 mg/dL    Comment: Performed at [facility] [address] [place], [place] [address]  Beta-hydroxybutyric acid     Status: Abnormal   Collection Time: 07/21/21 10:09 PM  Result Value Ref Range   Beta-Hydroxybutyric Acid 2.53 (H) 0.05 - 0.27 mmol/L    Comment: Performed at [facility] [address] [place], [place] [address]  Lactic acid, plasma     Status: Abnormal   Collection Time: 07/21/21 10:57 PM  Result Value Ref Range   Lactic Acid, Venous 2.9 (HH) 0.5 - 1.9 mmol/L    Comment: CRITICAL VALUE NOTED.  VALUE IS CONSISTENT WITH PREVIOUSLY REPORTED AND CALLED VALUE. Performed at [facility], [place] [address], [place], [place] [address]   Hemoglobin A1c     Status: Abnormal   Collection Time: 07/22/21 12:04 AM  Result Value Ref Range   Hgb A1c MFr Bld 13.8 (H) 4.8 - 5.6 %    Comment: (NOTE) Pre diabetes:          5.7%-6.4%  Diabetes:              >6.4%  Glycemic control for   <7.0% adults with diabetes    Mean Plasma Glucose 349.36 mg/dL    Comment: Performed at [facility] [address], [place], [place] [address]  I-Stat venous blood gas, ED     Status: Abnormal   Collection Time: 07/22/21 12:20 AM  Result Value Ref Range   pH, Ven 7.358 7.250 - 7.430   pCO2, Ven 38.3 (L) 44.0 - 60.0 mmHg   pO2, Ven 25.0 (LL) 32.0 - 45.0 mmHg   Bicarbonate 21.5 20.0 - 28.0 mmol/L   TCO2 23 22 - 32 mmol/L   O2 Saturation 43.0 %   Acid-base deficit 4.0 (H) 0.0 - 2.0 mmol/L   Sodium 124 (L) 135 - 145 mmol/L  Potassium 4.4 3.5 - 5.1 mmol/L   Calcium, Ion 1.13 (L) 1.15 - 1.40 mmol/L   HCT 47.0 39.0 - 52.0 %   Hemoglobin 16.0 13.0 - 17.0 g/dL   Sample type VENOUS    Comment NOTIFIED PHYSICIAN   I-stat chem 8, ED     Status: Abnormal   Collection Time: 07/22/21 12:21 AM  Result Value Ref Range   Sodium 124 (L) 135 - 145 mmol/L   Potassium 4.4 3.5 - 5.1 mmol/L   Chloride 92 (L) 98 - 111 mmol/L   BUN 41 (H) 6 - 20 mg/dL   Creatinine, Ser 2.30  (H) 0.61 - 1.24 mg/dL   Glucose, Bld 459 (H) 70 - 99 mg/dL    Comment: Glucose reference range applies only to samples taken after fasting for at least 8 hours.   Calcium, Ion 1.09 (L) 1.15 - 1.40 mmol/L   TCO2 21 (L) 22 - 32 mmol/L   Hemoglobin 16.3 13.0 - 17.0 g/dL   HCT 48.0 39.0 - 78.4 %  Basic metabolic panel     Status: Abnormal   Collection Time: 07/22/21 12:59 AM  Result Value Ref Range   Sodium 121 (L) 135 - 145 mmol/L   Potassium 4.0 3.5 - 5.1 mmol/L   Chloride 90 (L) 98 - 111 mmol/L   CO2 16 (L) 22 - 32 mmol/L   Glucose, Bld 453 (H) 70 - 99 mg/dL    Comment: Glucose reference range applies only to samples taken after fasting for at least 8 hours.   BUN 42 (H) 6 - 20 mg/dL   Creatinine, Ser 2.32 (H) 0.61 - 1.24 mg/dL   Calcium 7.9 (L) 8.9 - 10.3 mg/dL   GFR, Estimated 32 (L) >60 mL/min    Comment: (NOTE) Calculated using the CKD-EPI Creatinine Equation (2021)    Anion gap 15 5 - 15    Comment: Performed at [facility] [address] [place], [place] [address]  Beta-hydroxybutyric acid     Status: Abnormal   Collection Time: 07/22/21 12:59 AM  Result Value Ref Range   Beta-Hydroxybutyric Acid 2.76 (H) 0.05 - 0.27 mmol/L    Comment: Performed at [facility] [address], [place], [place] [address]  CBG monitoring, ED     Status: Abnormal   Collection Time: 07/22/21  2:14 AM  Result Value Ref Range   Glucose-Capillary 458 (H) 70 - 99 mg/dL    Comment: Glucose reference range applies only to samples taken after fasting for at least 8 hours.  Troponin I (High Sensitivity)     Status: Abnormal   Collection Time: 07/22/21  2:46 AM  Result Value Ref Range   Troponin I (High Sensitivity) 52 (H) <18 ng/L    Comment: (NOTE) Elevated high sensitivity troponin I (hsTnI) values and significant  changes across serial measurements may suggest ACS but many other  chronic and acute conditions are known to elevate hsTnI results.  Refer to the "Links" section for  chest pain algorithms and additional  guidance. Performed at [facility], [place] [address] [place], [place] [address]   Urinalysis, Routine w reflex microscopic Urine, Clean Catch     Status: Abnormal   Collection Time: 07/22/21  2:50 AM  Result Value Ref Range   Color, Urine YELLOW YELLOW   APPearance CLOUDY (A) CLEAR   Specific Gravity, Urine 1.018 1.005 - 1.030   pH 5.0 5.0 - 8.0   Glucose, UA >=500 (A) NEGATIVE mg/dL  Hgb urine dipstick SMALL (A) NEGATIVE   Bilirubin Urine NEGATIVE NEGATIVE   Ketones, ur 5 (A) NEGATIVE mg/dL   Protein, ur 30 (A) NEGATIVE mg/dL   Nitrite NEGATIVE NEGATIVE   Leukocytes,Ua TRACE (A) NEGATIVE   RBC / HPF 0-5 0 - 5 RBC/hpf   WBC, UA 6-10 0 - 5 WBC/hpf   Bacteria, UA FEW (A) NONE SEEN   Squamous Epithelial / LPF 0-5 0 - 5   Mucus PRESENT    Hyaline Casts, UA PRESENT    Granular Casts, UA PRESENT     Comment: Performed at [facility], [place] [address], [place], [place] [address]  CBG monitoring, ED     Status: Abnormal   Collection Time: 07/22/21  2:58 AM  Result Value Ref Range   Glucose-Capillary 460 (H) 70 - 99 mg/dL    Comment: Glucose reference range applies only to samples taken after fasting for at least 8 hours.  Lactic acid, plasma     Status: Abnormal   Collection Time: 07/22/21  3:00 AM  Result Value Ref Range   Lactic Acid, Venous 3.2 (HH) 0.5 - 1.9 mmol/L    Comment: CRITICAL VALUE NOTED.  VALUE IS CONSISTENT WITH PREVIOUSLY REPORTED AND CALLED VALUE. Performed at [facility], [place] [address] [place], [place] [address]   CBG monitoring, ED     Status: Abnormal   Collection Time: 07/22/21  3:37 AM  Result Value Ref Range   Glucose-Capillary 415 (H) 70 - 99 mg/dL    Comment: Glucose reference range applies only to samples taken after fasting for at least 8 hours.  Protime-INR     Status: Abnormal   Collection Time: 07/22/21  3:50 AM  Result Value Ref Range   Prothrombin Time 16.5 (H) 11.4 - 15.2 seconds   INR  1.3 (H) 0.8 - 1.2    Comment: (NOTE) INR goal varies based on device and disease states. Performed at [facility], [place] [address] [place],  [address]   CBG monitoring, ED     Status: Abnormal   Collection Time: 07/22/21  4:29 AM  Result Value Ref Range   Glucose-Capillary 415 (H) 70 - 99 mg/dL    Comment: Glucose reference range applies only to samples taken after fasting for at least 8 hours.  CBG monitoring, ED     Status: Abnormal   Collection Time: 07/22/21  5:06 AM  Result Value Ref Range   Glucose-Capillary 372 (H) 70 - 99 mg/dL    Comment: Glucose reference range applies only to samples taken after fasting for at least 8 hours.    DG Chest 1 View  Result Date: 07/22/2021 EXAM: EXAM CHEST  1 VIEW COMPARISON:  Portable AP chest today at 8:08 p.m. FINDINGS: Provided are 2 separate lateral chest radiographs on this patient. There is a trace left pleural effusion but there is no convincing overlying infiltrate. Exaggerated thoracic kyphosis is noted with multilevel thoracic spine degenerative discs. IMPRESSION: Trace left pleural effusion visible on the lateral view, but no definitive adjacent infiltrate. Electronically Signed   By: Telford Nab M.D.   On: 07/22/2021 00:52   CT Foot Left Wo Contrast  Result Date: 07/22/2021 CLINICAL DATA:  Left foot infection EXAM: CT OF THE LEFT FOOT WITHOUT CONTRAST TECHNIQUE: Multidetector CT imaging of the left foot was performed according to the standard protocol. Multiplanar CT image reconstructions were also generated. COMPARISON:  None. FINDINGS: Bones/Joint/Cartilage Transmetatarsal amputation of the first and second digits  of the left foot are noted. Intra osseous gas and cortical erosion is seen involving the base of the residual proximal phalanx, metatarsal head, and sesamoid bones of the great toe in keeping with changes of osteomyelitis. Superimposed advanced degenerative arthritis of the first MTP joint. Remaining joint  spaces are preserved. No acute fracture or dislocation. No additional sites of osseous erosion identified. Ligaments Suboptimally assessed by CT. Muscles and Tendons Unremarkable Soft tissues There is extensive soft tissue swelling involving the residual great toe with extensive subcutaneous gas surrounding the first MTP joint and extending medially and laterally into the plantar soft tissues subjacent to the second metatarsal. Moderate subcutaneous edema of the left foot and ankle. No discrete drainable subcutaneous fluid collection identified. Plantar ulcer noted within the residual great toe. IMPRESSION: Extensive subcutaneous gas in keeping with aggressive soft tissue infection and erosive changes surrounding the first MTP joint in keeping with septic arthritis/osteomyelitis in this location. No discrete drainable fluid collection identified. Electronically Signed   By: Fidela Salisbury M.D.   On: 07/22/2021 02:33   DG Chest Port 1 View  Result Date: 07/21/2021 CLINICAL DATA:  55 year old male with pain. EXAM: PORTABLE CHEST 1 VIEW COMPARISON:  None. FINDINGS: UPPER limits normal heart size noted. Equivocal LEFT LOWER lobe retrocardiac density/atelectasis may be present. There is no other evidence of focal airspace disease, pulmonary edema, suspicious pulmonary nodule/mass, pleural effusion, or pneumothorax. No acute bony abnormalities are identified. IMPRESSION: 1. Equivocal LEFT LOWER lobe retrocardiac density/atelectasis. Recommend LATERAL chest radiograph if there is clinical suspicion for pneumonia. Electronically Signed   By: Margarette Canada M.D.   On: 07/21/2021 20:50   DG Toe Great Left  Result Date: 07/21/2021 CLINICAL DATA:  Left great toe pain, osteomyelitis EXAM: LEFT GREAT TOE COMPARISON:  None. FINDINGS: Three view radiograph left great toe demonstrates resection of the distal phalanx of the second digit and probable trans phalangeal amputation proximal phalanx of the great toe. There is a soft  tissue ulcer involving the distal, medial, plantar aspect the residual left greatq toe. There is subcutaneous gas within the a dorsal lateral soft tissues of the left great toe. There are periarticular lucencies within the a distal phalanx and distal aspect of the first metatarsal medially suspicious for changes of osteomyelitis/septic arthritis. Superimposed moderate degenerative arthritis of the first MTP joint. IMPRESSION: Postsurgical changes as described above. Subcutaneous gas within the dorsal lateral soft tissues at the level of the first MTP joint with subjacent periarticular lucency within the a base of the first proximal phalanx and distal aspect of the first metatarsal suspicious for changes of septic arthritis/osteomyelitis. This could be confirmed with contrast enhanced MRI examination. Electronically Signed   By: Fidela Salisbury M.D.   On: 07/21/2021 20:59    Review of Systems  Respiratory:  Negative for cough, wheezing and stridor.   Musculoskeletal:  Positive for arthralgias.  Skin:  Positive for color change (Erythema left foot) and wound (left great toe).  Neurological:  Positive for numbness (History of peripheral neuropathy). Negative for speech difficulty.  Psychiatric/Behavioral:  Negative for confusion.   Blood pressure 118/73, pulse 62, temperature (!) 97.5 F (36.4 C), temperature source Tympanic, resp. rate (!) 24, height 6\' 3"  (1.905 m), weight 108.9 kg, SpO2 91 %. Physical Exam Constitutional:      Appearance: He is ill-appearing.  HENT:     Head: Normocephalic and atraumatic.  Eyes:     Extraocular Movements: Extraocular movements intact.  Pulmonary:     Breath sounds: No  wheezing.  Musculoskeletal:        General: Swelling, tenderness and deformity present.     Comments: Examination of the left foot and ankle demonstrates prior partial hallux amputation with significant ulceration and devitalized tissue about the medial aspect of the forefoot.  Significant soft  tissue swelling noted.  Surrounding erythema that extends proximally through the midfoot.  Areas of fluctuance to palpation throughout the midfoot.  There is malodor.  Purulent discharge noted as well.  Range of motion at the ankle is intact.  His calf is soft and nontender.  Sensation is altered secondary to baseline neuropathy.  Skin:    Findings: Erythema present.  Neurological:     Mental Status: He is alert.    Assessment/Plan: Patient has a significant bony and soft tissue infection in his left foot that is limb threatening and subsequently the source of the patient's sepsis. Because he is already established with [name], we will defer to his treatment recommendations in terms of amputation for source control.  We will be happy to help in adjunct if needed.   Jeanmarie Mccowen J Martin[place] 07/22/2021, 5:34 AM

## 2021-07-22 NOTE — Progress Notes (Signed)
[name] [id number] Admission Data: 07/22/2021 5:50 PM Attending Provider: Jacky Kindle, MD  L[facility]:[id number], No Pcp Per (Inactive) Consults/ Treatment Team: Treatment Team:  [name], MD  [name] is a 55 y.o. male patient admitted from ED awake, alert  & orientated  X 3,  Full Code, VSS - Blood pressure (!) 114/95, pulse (!) 102, temperature 97.6 F (36.4 C), temperature source Oral, resp. rate (!) 29, height 6\' 3"  (1.905 m), weight 108.9 kg, SpO2 91 %., O2  4L nasal cannular, no c/o shortness of breath, no c/o chest pain, no distress noted. Tele # [id number] placed and pt is currently running:sinus tachycardia.   IV site WDL:  forearm right, condition patent and no redness and left, condition patent and no redness and antecubital right, condition patent and no redness and left, condition patent and no redness with a transparent dsg that's clean dry and intact. Marland Kitchenskin assessment completed and wound was noted in left foot and documented.     Will cont to monitor and assist as needed.  [name] Shelda Pal, RN 07/22/2021 5:50 PM

## 2021-07-22 NOTE — ED Notes (Signed)
Still pending orders for vasopressors. Last BP 75/41. Dr. Broadus [name] aware

## 2021-07-22 NOTE — Progress Notes (Signed)
Attempted 2D echocardiogram, however patient has sustained heart rate of 146bpm and is tachypneic. Will attempt again when patient is more stable and heart rate is <125bpm.  07/22/2021 10:17 AM Kelby Aline., MHA, RVT, RDCS, RDMS

## 2021-07-22 NOTE — ED Notes (Signed)
Pts BP has dropped to 82/43. This RN is unable to go to MRI with pt at this time. MRI deferred until pt able to get a bed upstairs. MRI aware.

## 2021-07-22 NOTE — Discharge Instructions (Addendum)
[facility] Endocrin[facility] [phone]) Dr. Philemon Kingdom Dr. Janie [facility] [phone]) Dr. Delrae Rend [facility] [phone]) Dr. Jacelyn Pi Dr. Anda Kraft [facility] [phone]) Dr. Daneil Dolin [facility] [phone]) [place] office]  [phone]) Shari Prows office] Dr. Lavone Orn Dr. Mee Hives [facility]) [phone]) Autumn Barbaraann Barthel Ronnald Ramp), PA Dr. Amalia Greenhouse Dr. Marsh Dolly. [facility] [phone]) Dr. Glade Lloyd [facility] [phone]) Dr. Orville Govern Dr. Lelon Huh Dr. Lavone Nian, FNP Dr. Carolynn Serve. [facility] in [place] [phone])   Information on my medicine - ELIQUIS (apixaban) Why was Eliquis prescribed for you? Eliquis was prescribed for you to reduce the risk of a blood clot forming that can cause a stroke if you have a medical condition called atrial fibrillation (a type of irregular heartbeat).  What do You need to know about Eliquis ? Take your Eliquis TWICE DAILY - one tablet in the morning and one tablet in the evening with or without food. If you have difficulty swallowing the tablet whole please discuss with your pharmacist how to take the medication safely.  Take Eliquis exactly as prescribed by your doctor and DO NOT stop taking Eliquis without talking to the doctor who prescribed the medication.  Stopping may increase your risk of developing a stroke.  Refill your prescription before you run out.  After discharge, you should have regular check-up appointments with your healthcare provider that is prescribing your Eliquis.  In the future your dose may need to be changed if your kidney function or weight changes by a significant amount or as you get older.  What do you do if you miss a dose? If  you miss a dose, take it as soon as you remember on the same day and resume taking twice daily.  Do not take more than one dose of ELIQUIS at the same time to make up a missed dose.  Important Safety Information A possible side effect of Eliquis is bleeding. You should call your healthcare provider right away if you experience any of the following: Bleeding from an injury or your nose that does not stop. Unusual colored urine (red or dark brown) or unusual colored stools (red or black). Unusual bruising for unknown reasons. A serious fall or if you hit your head (even if there is no bleeding).  Some medicines may interact with Eliquis and might increase your risk of bleeding or clotting while on Eliquis. To help avoid this, consult your healthcare provider or pharmacist prior to using any new prescription or non-prescription medications, including herbals, vitamins, non-steroidal anti-inflammatory drugs (NSAIDs) and supplements.  This website has more information on Eliquis (apixaban): http://[email or web address].com/eliquis/home  Carbohydrate Counting For People With Diabetes  Foods with carbohydrates make your blood glucose level go up. Learning how to count carbohydrates can help you control your blood glucose levels. First, identify the foods you eat that contain carbohydrates. Then, using the Foods with Carbohydrates chart, determine about how much carbohydrates are in your meals and snacks. Make sure you are eating foods with fiber, protein, and healthy fat along with your carbohydrate foods. Foods with Carbohydrates The following table shows carbohydrate foods that have about 15 grams of carbohydrate each. Using measuring cups, spoons, or a food scale when you first begin learning about carbohydrate counting can help you learn about the portion sizes you typically eat. The following foods have 15 grams carbohydrate each:  Grains 1 slice  bread (1 ounce)  1 small tortilla (6-inch size)   large  bagel (1 ounce)  1/3 cup pasta or rice (cooked)   hamburger or hot dog bun ( ounce)   cup cooked cereal   to  cup ready-to-eat cereal  2 taco shells (5-inch size) Fruit 1 small fresh fruit ( to 1 cup)   medium banana  17 small grapes (3 ounces)  1 cup melon or berries   cup canned or frozen fruit  2 tablespoons dried fruit (blueberries, cherries, cranberries, raisins)   cup unsweetened fruit juice  Starchy Vegetables  cup cooked beans, peas, corn, potatoes/sweet potatoes   large baked potato (3 ounces)  1 cup acorn or butternut squash  Snack Foods 3 to 6 crackers  8 potato chips or 13 tortilla chips ( ounce to 1 ounce)  3 cups popped popcorn  Dairy 3/4 cup (6 ounces) nonfat plain yogurt, or yogurt with sugar-free sweetener  1 cup milk  1 cup plain rice, soy, coconut or flavored almond milk Sweets and Desserts  cup ice cream or frozen yogurt  1 tablespoon jam, jelly, pancake syrup, table sugar, or honey  2 tablespoons light pancake syrup  1 inch square of frosted cake or 2 inch square of unfrosted cake  2 small cookies (2/3 ounce each) or  large cookie  Sometimes you'll have to estimate carbohydrate amounts if you don't know the exact recipe. One cup of mixed foods like soups can have 1 to 2 carbohydrate servings, while some casseroles might have 2 or more servings of carbohydrate. Foods that have less than 20 calories in each serving can be counted as "free" foods. Count 1 cup raw vegetables, or  cup cooked non-starchy vegetables as "free" foods. If you eat 3 or more servings at one meal, then count them as 1 carbohydrate serving.  Foods without Carbohydrates  Not all foods contain carbohydrates. Meat, some dairy, fats, non-starchy vegetables, and many beverages don't contain carbohydrate. So when you count carbohydrates, you can generally exclude chicken, pork, beef, fish, seafood, eggs, tofu, cheese, butter, sour cream, avocado, nuts, seeds, olives, mayonnaise, water,  black coffee, unsweetened tea, and zero-calorie drinks. Vegetables with no or low carbohydrate include green beans, cauliflower, tomatoes, and onions. How much carbohydrate should I eat at each meal?  Carbohydrate counting can help you plan your meals and manage your weight. Following are some starting points for carbohydrate intake at each meal. Work with your registered dietitian nutritionist to find the best range that works for your blood glucose and weight.   To Lose Weight To Maintain Weight  Women 2 - 3 carb servings 3 - 4 carb servings  Men 3 - 4 carb servings 4 - 5 carb servings  Checking your blood glucose after meals will help you know if you need to adjust the timing, type, or number of carbohydrate servings in your meal plan. Achieve and keep a healthy body weight by balancing your food intake and physical activity.  Tips How should I plan my meals?  Plan for half the food on your plate to include non-starchy vegetables, like salad greens, broccoli, or carrots. Try to eat 3 to 5 servings of non-starchy vegetables every day. Have a protein food at each meal. Protein foods include chicken, fish, meat, eggs, or beans (note that beans contain carbohydrate). These two food groups (non-starchy vegetables and proteins) are low in carbohydrate. If you fill up your plate with these foods, you will eat less carbohydrate but still fill  up your stomach. Try to limit your carbohydrate portion to  of the plate.  What fats are healthiest to eat?  Diabetes increases risk for heart disease. To help protect your heart, eat more healthy fats, such as olive oil, nuts, and avocado. Eat less saturated fats like butter, cream, and high-fat meats, like bacon and sausage. Avoid trans fats, which are in all foods that list "partially hydrogenated oil" as an ingredient. What should I drink?  Choose drinks that are not sweetened with sugar. The healthiest choices are water, carbonated or seltzer waters, and tea and  coffee without added sugars.  Sweet drinks will make your blood glucose go up very quickly. One serving of soda or energy drink is  cup. It is best to drink these beverages only if your blood glucose is low.  Artificially sweetened, or diet drinks, typically do not increase your blood glucose if they have zero calories in them. Read labels of beverages, as some diet drinks do have carbohydrate and will raise your blood glucose. Label Reading Tips Read Nutrition Facts labels to find out how many grams of carbohydrate are in a food you want to eat. Don't forget: sometimes serving sizes on the label aren't the same as how much food you are going to eat, so you may need to calculate how much carbohydrate is in the food you are serving yourself.   Carbohydrate Counting for People with Diabetes Sample 1-Day Menu  Breakfast  cup yogurt, low fat, low sugar (1 carbohydrate serving)   cup cereal, ready-to-eat, unsweetened (1 carbohydrate serving)  1 cup strawberries (1 carbohydrate serving)   cup almonds ( carbohydrate serving)  Lunch 1, 5 ounce can chunk light tuna  2 ounces cheese, low fat cheddar  6 whole wheat crackers (1 carbohydrate serving)  1 small apple (1 carbohydrate servings)   cup carrots ( carbohydrate serving)   cup snap peas  1 cup 1% milk (1 carbohydrate serving)   Evening Meal Stir fry made with: 3 ounces chicken  1 cup brown rice (3 carbohydrate servings)   cup broccoli ( carbohydrate serving)   cup green beans   cup onions  1 tablespoon olive oil  2 tablespoons teriyaki sauce ( carbohydrate serving)  Evening Snack 1 extra small banana (1 carbohydrate serving)  1 tablespoon peanut butter   Carbohydrate Counting for People with Diabetes Vegan Sample 1-Day Menu  Breakfast 1 cup cooked oatmeal (2 carbohydrate servings)   cup blueberries (1 carbohydrate serving)  2 tablespoons flaxseeds  1 cup soymilk fortified with calcium and vitamin D  1 cup coffee  Lunch 2  slices whole wheat bread (2 carbohydrate servings)   cup baked tofu   cup lettuce  2 slices tomato  2 slices avocado   cup baby carrots ( carbohydrate serving)  1 orange (1 carbohydrate serving)  1 cup soymilk fortified with calcium and vitamin D   Evening Meal Burrito made with: 1 6-inch corn tortilla (1 carbohydrate serving)  1 cup refried vegetarian beans (2 carbohydrate servings)   cup chopped tomatoes   cup lettuce   cup salsa  1/3 cup brown rice (1 carbohydrate serving)  1 tablespoon olive oil for rice   cup zucchini   Evening Snack 6 small whole grain crackers (1 carbohydrate serving)  2 apricots ( carbohydrate serving)   cup unsalted peanuts ( carbohydrate serving)    Carbohydrate Counting for People with Diabetes Vegetarian (Lacto-Ovo) Sample 1-Day Menu  Breakfast 1 cup cooked oatmeal (2 carbohydrate servings)  cup blueberries (1 carbohydrate serving)  2 tablespoons flaxseeds  1 egg  1 cup 1% milk (1 carbohydrate serving)  1 cup coffee  Lunch 2 slices whole wheat bread (2 carbohydrate servings)  2 ounces low-fat cheese   cup lettuce  2 slices tomato  2 slices avocado   cup baby carrots ( carbohydrate serving)  1 orange (1 carbohydrate serving)  1 cup unsweetened tea  Evening Meal Burrito made with: 1 6-inch corn tortilla (1 carbohydrate serving)   cup refried vegetarian beans (1 carbohydrate serving)   cup tomatoes   cup lettuce   cup salsa  1/3 cup brown rice (1 carbohydrate serving)  1 tablespoon olive oil for rice   cup zucchini  1 cup 1% milk (1 carbohydrate serving)  Evening Snack 6 small whole grain crackers (1 carbohydrate serving)  2 apricots ( carbohydrate serving)   cup unsalted peanuts ( carbohydrate serving)    Copyright 2020  [employer]. All rights reserved.  Using Nutrition Labels: Carbohydrate  Serving Size  Look at the serving size. All the information on the label is based on this  portion. Servings Per Container  The number of servings contained in the package. Guidelines for Carbohydrate  Look at the total grams of carbohydrate in the serving size.  1 carbohydrate choice = 15 grams of carbohydrate. Range of Carbohydrate Grams Per Choice  Carbohydrate Grams/Choice Carbohydrate Choices  6-10   11-20 1  21-25 1  26-35 2  36-40 2  41-50 3  51-55 3  56-65 4  66-70 4  71-80 5    Copyright 2020  [employer]y [employer]. All rights reserved.

## 2021-07-22 NOTE — ED Notes (Signed)
CM has seen the pt at this time. Pending order for vasopressors

## 2021-07-22 NOTE — ED Notes (Signed)
SpO2 89% on RA. Pt placed on 2 l [place] at this time

## 2021-07-22 NOTE — Consult Note (Signed)
Cardiology Consultation:   [name] MRN: [id number]; DOB: Jun 09, 1966  Admit date: 07/21/2021 Date of Consult: 07/22/2021  PCP:  Patient, No Pcp Per (Inactive)   [facility] Providers Cardiologist:  Sanda Klein, MD        Patient Profile:   [name] is a 55 y.o. male with a hx of HFpEF (EF = 50%), HTN, DM2, CKD 3, osteomyelitis s/p R BKA who is being seen 07/22/2021 for the evaluation of sepsis secondary to osteomyelitis at the request of Dr. Alcario Drought.  History of Present Illness:   [name] reports having left foot pain over the past couple days with associated rigors and upper left back pain.  He has felt generally unwell and has noticed increased drainage from his left foot.  This prompted him to come to the ED for evaluation.  In the ED he was noted to be tachycardic to the 140-150s.  EKG showed new onset atrial flutter with 2:1 conduction.  Labs notable for multiple metabolic derangements, leukocytosis 13 and lactate of 4.  Foot imaging notable for concern for septic arthritis/osteomyelitis.  Given that the patient is septic and tachycardic, cardiology is consulted for evaluation.  He denies chest pain, palpitations, syncope, presyncope, PND, orthopnea, abdominal symptoms.  Endorses upper back pain, shortness of breath.   Past Medical History:  Diagnosis Date   Chronic diastolic CHF (congestive heart failure) (HCC)    Diabetes mellitus without complication ([place])    Hypertension     Past Surgical History:  Procedure Laterality Date   BELOW KNEE LEG AMPUTATION Right      Home Medications:  Prior to Admission medications   Medication Sig Start Date End Date Taking? Authorizing Provider  furosemide (LASIX) 40 MG tablet Take 40 mg once daily for a weight under 240 lbs. For a weight over 240 lbs, take 80 mg (2 tablets) Patient taking differently: Take 40-80 mg by mouth daily. . For a weight over 240 lbs, take 80 mg (2 tablets) 05/16/21  Yes  Croitoru, Mihai, MD  ibuprofen (ADVIL) 200 MG tablet Take 600-800 mg by mouth every 6 (six) hours as needed for headache or moderate pain.   Yes [provider]  insulin NPH Human (NOVOLIN N) 100 UNIT/ML injection Inject 0.15 mLs (15 Units total) into the skin at bedtime. Patient taking differently: Inject 20 Units into the skin See admin instructions. 3-4 times daily 10/12/19  Yes Shamleffer, Melanie Crazier, MD  lisinopril (ZESTRIL) 10 MG tablet Take 1 tablet (10 mg total) by mouth daily. 05/16/21 08/14/21 Yes Croitoru, Mihai, MD  Omega 3 1000 MG CAPS Take 1,000 mg by mouth daily.   Yes [provider]  Oxycodone HCl 10 MG TABS Take 10 mg by mouth 3 (three) times daily as needed (pain). 07/20/19  Yes [provider]  potassium chloride SA (KLOR-CON) 20 MEQ tablet Take 1 tablet (20 mEq total) by mouth daily. 04/24/21  Yes Croitoru, Mihai, MD  sildenafil (VIAGRA) 100 MG tablet Take 100 mg by mouth daily as needed for erectile dysfunction. 02/05/21  Yes [provider]  metoprolol tartrate (LOPRESSOR) 100 MG tablet Take one tablet two hours before the test Patient not taking: No sig reported 05/16/21   Croitoru, Mihai, MD  sildenafil (VIAGRA) 50 MG tablet Take 1 tablet (50 mg total) by mouth daily as needed for erectile dysfunction. Patient not taking: Reported on 07/21/2021 09/25/19   Libby Maw, MD    Inpatient Medications: Scheduled Meds:  vancomycin variable dose per  unstable renal function (pharmacist dosing)   Does not apply See admin instructions   Continuous Infusions:  clindamycin (CLEOCIN) IV 600 mg (07/22/21 0308)   dextrose 5% lactated ringers     heparin 1,700 Units/hr (07/22/21 0109)   insulin 9.5 Units/hr (07/22/21 0432)   lactated ringers 125 mL/hr at 07/22/21 0111   piperacillin-tazobactam (ZOSYN)  IV     PRN Meds: acetaminophen **OR** acetaminophen, dextrose, fentaNYL (SUBLIMAZE) injection, ondansetron **OR** ondansetron (ZOFRAN)  IV  Allergies:    Allergies  Allergen Reactions   Bactrim [Sulfamethoxazole-Trimethoprim]    Ceprotin [Protein C Concentrate (Human)]    Ciprofloxacin Other (See Comments)    Kidney function   Levaquin [Levofloxacin]     Social History:   Social History   Socioeconomic History   Marital status: Divorced    Spouse name: Not on file   Number of children: Not on file   Years of education: Not on file   Highest education level: Not on file  Occupational History   Not on file  Tobacco Use   Smoking status: Never   Smokeless tobacco: Never  Vaping Use   Vaping Use: Never used  Substance and Sexual Activity   Alcohol use: Never   Drug use: Never   Sexual activity: Yes  Other Topics Concern   Not on file  Social History Narrative   Not on file   Social Determinants of Health   Financial Resource Strain: Not on file  Food Insecurity: Not on file  Transportation Needs: Not on file  Physical Activity: Not on file  Stress: Not on file  Social Connections: Not on file  Intimate Partner Violence: Not on file    Family History:    Family History  Problem Relation Age of Onset   Anxiety disorder Mother    Cancer Father    Coronary artery disease Father      ROS:  Please see the history of present illness.   All other ROS reviewed and negative.     Physical Exam/Data:   Vitals:   07/22/21 0300 07/22/21 0315 07/22/21 0330 07/22/21 0345  BP: 94/68 106/80 105/69 118/73  Pulse: (!) 54 (!) 125 (!) 37 62  Resp: (!) 34 19 (!) 21 (!) 24  Temp:      TempSrc:      SpO2: 94% 96% 97% 91%  Weight:      Height:        Intake/Output Summary (Last 24 hours) at 07/22/2021 0439 Last data filed at 07/22/2021 0309 Gross per 24 hour  Intake 4150 ml  Output --  Net 4150 ml   Last 3 Weights 07/22/2021 05/16/2021 04/24/2021  Weight (lbs) 240 lb 240 lb 238 lb  Weight (kg) 108.863 kg 108.863 kg 107.956 kg     Body mass index is 30 kg/m.  General:  Well nourished, well  developed, in moderate distress from pain HEENT: normal Neck: no JVD Vascular: Distal pulses 2+ bilaterally Cardiac:  normal S1, S2; tachycardic with regular rhythm; no murmur, rubs or gallops Lungs:  clear to auscultation bilaterally anteriorly, no wheezing, rhonchi or rales  Abd: soft, nontender, no hepatomegaly  Ext: Left foot with marked edema and erythema with multiple overlying ulcerations with mucopurulent drainage, RLE s/p BKA Musculoskeletal: Normal bulk Skin: warm and dry  Neuro:  CNs 2-12 intact, no focal abnormalities noted Psych:  Normal affect   EKG:  The EKG was personally reviewed and demonstrates: A flutter with RVR with 2:1 Telemetry:  Telemetry was personally  reviewed and demonstrates: A. fib with RVR  Relevant CV Studies:  TTE 05/01/21:  IMPRESSIONS     1. Left ventricular ejection fraction, by estimation, is 50 to 55%. The  left ventricle has low normal function. The left ventricle has no regional  wall motion abnormalities. Left ventricular diastolic parameters are  consistent with Grade II diastolic  dysfunction (pseudonormalization).   2. Right ventricular systolic function is normal. The right ventricular  size is normal.   3. Left atrial size was severely dilated.   4. Right atrial size was mildly dilated.   5. The mitral valve is normal in structure. Mild mitral valve  regurgitation. No evidence of mitral stenosis.   6. The aortic valve is normal in structure. Aortic valve regurgitation is  trivial. No aortic stenosis is present.   Laboratory Data:  High Sensitivity Troponin:   Recent Labs  Lab 07/22/21 0246  TROPONINIHS 52*     Chemistry Recent Labs  Lab 07/21/21 2003 07/22/21 0020 07/22/21 0021 07/22/21 0059  NA 123* 124* 124* 121*  K 4.7 4.4 4.4 4.0  CL 87*  --  92* 90*  CO2 18*  --   --  16*  GLUCOSE 516*  --  459* 453*  BUN 40*  --  41* 42*  CREATININE 2.76*  --  2.30* 2.32*  CALCIUM 8.6*  --   --  7.9*  GFRNONAA 26*  --   --   32*  ANIONGAP 18*  --   --  15    Recent Labs  Lab 07/21/21 2003  PROT 5.6*  ALBUMIN 2.0*  AST 28  ALT 40  ALKPHOS 242*  BILITOT 1.0   Lipids No results for input(s): CHOL, TRIG, HDL, LABVLDL, LDLCALC, CHOLHDL in the last 168 hours.  Hematology Recent Labs  Lab 07/21/21 2003 07/22/21 0020 07/22/21 0021  WBC 13.0*  --   --   RBC 6.30*  --   --   HGB 17.9* 16.0 16.3  HCT 53.2* 47.0 48.0  MCV 84.4  --   --   MCH 28.4  --   --   MCHC 33.6  --   --   RDW 14.6  --   --   PLT 141*  --   --    Thyroid No results for input(s): TSH, FREET4 in the last 168 hours.  BNPNo results for input(s): BNP, PROBNP in the last 168 hours.  DDimer No results for input(s): DDIMER in the last 168 hours.   Radiology/Studies:  DG Chest 1 View  Result Date: 07/22/2021 EXAM: EXAM CHEST  1 VIEW COMPARISON:  Portable AP chest today at 8:08 p.m. FINDINGS: Provided are 2 separate lateral chest radiographs on this patient. There is a trace left pleural effusion but there is no convincing overlying infiltrate. Exaggerated thoracic kyphosis is noted with multilevel thoracic spine degenerative discs. IMPRESSION: Trace left pleural effusion visible on the lateral view, but no definitive adjacent infiltrate. Electronically Signed   By: Telford Nab M.D.   On: 07/22/2021 00:52   CT Foot Left Wo Contrast  Result Date: 07/22/2021 CLINICAL DATA:  Left foot infection EXAM: CT OF THE LEFT FOOT WITHOUT CONTRAST TECHNIQUE: Multidetector CT imaging of the left foot was performed according to the standard protocol. Multiplanar CT image reconstructions were also generated. COMPARISON:  None. FINDINGS: Bones/Joint/Cartilage Transmetatarsal amputation of the first and second digits of the left foot are noted. Intra osseous gas and cortical erosion is seen involving the base of the residual proximal  phalanx, metatarsal head, and sesamoid bones of the great toe in keeping with changes of osteomyelitis. Superimposed advanced  degenerative arthritis of the first MTP joint. Remaining joint spaces are preserved. No acute fracture or dislocation. No additional sites of osseous erosion identified. Ligaments Suboptimally assessed by CT. Muscles and Tendons Unremarkable Soft tissues There is extensive soft tissue swelling involving the residual great toe with extensive subcutaneous gas surrounding the first MTP joint and extending medially and laterally into the plantar soft tissues subjacent to the second metatarsal. Moderate subcutaneous edema of the left foot and ankle. No discrete drainable subcutaneous fluid collection identified. Plantar ulcer noted within the residual great toe. IMPRESSION: Extensive subcutaneous gas in keeping with aggressive soft tissue infection and erosive changes surrounding the first MTP joint in keeping with septic arthritis/osteomyelitis in this location. No discrete drainable fluid collection identified. Electronically Signed   By: Fidela Salisbury M.D.   On: 07/22/2021 02:33   DG Chest Port 1 View  Result Date: 07/21/2021 CLINICAL DATA:  55 year old male with pain. EXAM: PORTABLE CHEST 1 VIEW COMPARISON:  None. FINDINGS: UPPER limits normal heart size noted. Equivocal LEFT LOWER lobe retrocardiac density/atelectasis may be present. There is no other evidence of focal airspace disease, pulmonary edema, suspicious pulmonary nodule/mass, pleural effusion, or pneumothorax. No acute bony abnormalities are identified. IMPRESSION: 1. Equivocal LEFT LOWER lobe retrocardiac density/atelectasis. Recommend LATERAL chest radiograph if there is clinical suspicion for pneumonia. Electronically Signed   By: Margarette Canada M.D.   On: 07/21/2021 20:50   DG Toe Great Left  Result Date: 07/21/2021 CLINICAL DATA:  Left great toe pain, osteomyelitis EXAM: LEFT GREAT TOE COMPARISON:  None. FINDINGS: Three view radiograph left great toe demonstrates resection of the distal phalanx of the second digit and probable trans  phalangeal amputation proximal phalanx of the great toe. There is a soft tissue ulcer involving the distal, medial, plantar aspect the residual left greatq toe. There is subcutaneous gas within the a dorsal lateral soft tissues of the left great toe. There are periarticular lucencies within the a distal phalanx and distal aspect of the first metatarsal medially suspicious for changes of osteomyelitis/septic arthritis. Superimposed moderate degenerative arthritis of the first MTP joint. IMPRESSION: Postsurgical changes as described above. Subcutaneous gas within the dorsal lateral soft tissues at the level of the first MTP joint with subjacent periarticular lucency within the a base of the first proximal phalanx and distal aspect of the first metatarsal suspicious for changes of septic arthritis/osteomyelitis. This could be confirmed with contrast enhanced MRI examination. Electronically Signed   By: Fidela Salisbury M.D.   On: 07/21/2021 20:59     Assessment and Plan:   [name] is a 55 y.o. male with a hx of HFpEF (EF = 50%), HTN, DM2, CKD 3, osteomyelitis s/p R BKA who is being seen 07/22/2021 for the evaluation of sepsis secondary to osteomyelitis at the request of Dr. Alcario Drought.  #New Onset Aflutter with RVR #Sepsis 2/2 Osteomyelitis :: Patient presented with sepsis secondary to likely osteomyelitis.  Found to be in new onset a flutter with RVR.  The driver of this patient's arrhythmia is most certainly sepsis and thus treating the underlying process is paramount.  I agree with not attempting rate control given that the patient's hemodynamics are tenuous in the setting of sepsis.  Furthermore, he likely needs this tachycardic response as a sequela of his sepsis; therefore, I do not recommend starting amiodarone either.  For now I would just continue to monitor on  telemetry and treat his underlying sepsis.  Once source control is obtained and if the patient remains in a flutter with RVR then can  discuss further treatment options. -Avoid use of beta-blockers or CCB's while the patient is actively septic -Treat sepsis and achieve source control per primary team -Formal TTE once heart rates are better controlled (can artificially lower EF while in tachycardia) -Maintain Mg >2. K>4 -Continue heparin -He will likely need long-term systemic anticoagulation    Risk Assessment/Risk Scores:          CHA2DS2-VASc Score = 3   This indicates a 3.2% annual risk of stroke. The patient's score is based upon:           For questions or updates, please contact [place] Please consult [email or web address] for contact info under    Signed, Hershal Coria, MD  07/22/2021 4:39 AM

## 2021-07-22 NOTE — ED Notes (Signed)
SpO2 90% on 2 L. Oxygen increased to 4 L [place] at this time

## 2021-07-22 NOTE — Progress Notes (Signed)
Inpatient Diabetes Program Recommendations  AACE/ADA: New Consensus Statement on Inpatient Glycemic Control (2015)  Target Ranges:  Prepandial:   less than 140 mg/dL      Peak postprandial:   less than 180 mg/dL (1-2 hours)      Critically ill patients:  140 - 180 mg/dL   Lab Results  Component Value Date   GLUCAP 130 (H) 07/22/2021   HGBA1C 13.8 (H) 07/22/2021    Review of Glycemic Control Results for [name], [name] (MRN [id number]) as of 07/22/2021 15:48  Ref. Range 07/22/2021 12:45 07/22/2021 13:46 07/22/2021 14:52  Glucose-Capillary Latest Ref Range: 70 - 99 mg/dL 164 (H) 135 (H) 130 (H)   Diabetes history: Type 2 DM Outpatient Diabetes medications: Novolog 70/30 20-30 units BID, Regular ~1:3 CR Current orders for Inpatient glycemic control: Levemir 25 units QD, Novolog 0-15 units TID & HS Solucortef 100 mg BID  Inpatient Diabetes Program Recommendations:    Spoke with patient regarding outpatient diabetes management. Has previously seen Dr Kelton Pillar with last appointment in Feb 2022. Was not planning on going back due to frustration with provider. Verified home medications; patient obtains insulin from [facility] and adjust based on what he eats. Has not had insulin in the last five days because of feeling so poorly. Reviewed patient's current A1c of 13.8%. Explained what a A1c is and what it measures. Also reviewed goal A1c with patient, importance of good glucose control @ home, and blood sugar goals. Reviewed patho of DM, need for insulin, role of pancreas, vascular changes, impact of poor glycemic control and infection, and other commorbidities. Patient has not been checking CBGs. Blood glucose meter #[id number]. Reviewed recommended frequency per ADA guidelines and importance of following up with PCP. Patient needs PCP at discharge and is concerned about transportation in the event of another amputation. Will place [facility] consult to help arrange and assist with PCP and transport.   Encouraged the need for close follow up and will attach endocrinology to DC summary.  Thanks, Bronson Curb, MSN, RNC-OB Diabetes Coordinator [phone] (8a-5p)

## 2021-07-22 NOTE — Plan of Care (Addendum)
Spoke with on-call for [employer].  I will give Dr. Sharol Given a call directly in next hour.  Update: Ortho coming in to see patient  Ortho saw pt: also recd adding MRIs of L and T spine to r/o epidural abscess.  Will order w/o contrast due to AKI.

## 2021-07-22 NOTE — ED Notes (Addendum)
Confirmed with Dr. Broadus [name] that she would like to d/c insulin drip 2 hours from when levemir was given. Insulin and dextrose due to be stopped at 1521.

## 2021-07-22 NOTE — H&P (Addendum)
History and Physical    Exander Shaul KYH:[id number] DOB: [birth year] DOA: 07/21/2021  PCP: Patient, No Pcp Per (Inactive)  Patient coming from: Home  I have personally briefly reviewed patient's old medical records in [facility]  Chief Complaint: Diabetic foot infection  HPI: [name] is a 55 y.o. male with medical history significant of DM2, HTN, dCHF, prior BKA on the R due to prior diabetic foot infection, seen by Dr. Sharol Given in office most recently in Aug this year, CKD 3a.  Pt presents to ED with c/o 1 week h/o L great toe progressive infection at site of prior L great toe amputation.  This has especially progressively worsened over the course of the past 2-3 days with purulent drainage, erythema, necrosis, skin sloughing.  Patient also with ~5 day h/o L sided upper back pain, constant, severe, associated SOB.  Also fevers, chills, fatigue.  Symptoms worse despite triple ABx ointment to wound.  No abd pain.   ED Course: Pt is in severe sepsis, given 31ml/kg IVF bolus, then given another 1L after having hypotension post bolus.  BP now 15-176 systolic, remains Tachy in the 140s.  Started on zosyn, vanc, clinda.  X ray of foot shows subq gas.   Review of Systems: As per HPI, otherwise all review of systems negative.  Past Medical History:  Diagnosis Date   Chronic diastolic CHF (congestive heart failure) (HCC)    Diabetes mellitus without complication ([place])    Hypertension     Past Surgical History:  Procedure Laterality Date   BELOW KNEE LEG AMPUTATION Right      reports that he has never smoked. He has never used smokeless tobacco. He reports that he does not drink alcohol and does not use drugs.  Allergies  Allergen Reactions   Bactrim [Sulfamethoxazole-Trimethoprim]    Ceprotin [Protein C Concentrate (Human)]    Ciprofloxacin Other (See Comments)    Kidney function   Levaquin [Levofloxacin]     Family History  Problem Relation Age of Onset    Anxiety disorder Mother    Cancer Father    Coronary artery disease Father      Prior to Admission medications   Medication Sig Start Date End Date Taking? Authorizing Provider  furosemide (LASIX) 40 MG tablet Take 40 mg once daily for a weight under 240 lbs. For a weight over 240 lbs, take 80 mg (2 tablets) Patient taking differently: Take 40-80 mg by mouth daily. . For a weight over 240 lbs, take 80 mg (2 tablets) 05/16/21  Yes Croitoru, Mihai, MD  ibuprofen (ADVIL) 200 MG tablet Take 600-800 mg by mouth every 6 (six) hours as needed for headache or moderate pain.   Yes [provider]  insulin NPH Human (NOVOLIN N) 100 UNIT/ML injection Inject 0.15 mLs (15 Units total) into the skin at bedtime. Patient taking differently: Inject 20 Units into the skin See admin instructions. 3-4 times daily 10/12/19  Yes Shamleffer, Melanie Crazier, MD  lisinopril (ZESTRIL) 10 MG tablet Take 1 tablet (10 mg total) by mouth daily. 05/16/21 08/14/21 Yes Croitoru, Mihai, MD  Omega 3 1000 MG CAPS Take 1,000 mg by mouth daily.   Yes [provider]  Oxycodone HCl 10 MG TABS Take 10 mg by mouth 3 (three) times daily as needed (pain). 07/20/19  Yes [provider]  potassium chloride SA (KLOR-CON) 20 MEQ tablet Take 1 tablet (20 mEq total) by mouth daily. 04/24/21  Yes Croitoru, Mihai, MD  sildenafil (VIAGRA) 100 MG  tablet Take 100 mg by mouth daily as needed for erectile dysfunction. 02/05/21  Yes [provider]  metoprolol tartrate (LOPRESSOR) 100 MG tablet Take one tablet two hours before the test Patient not taking: No sig reported 05/16/21   Croitoru, Mihai, MD  sildenafil (VIAGRA) 50 MG tablet Take 1 tablet (50 mg total) by mouth daily as needed for erectile dysfunction. Patient not taking: Reported on 07/21/2021 09/25/19   Libby Maw, MD    Physical Exam: Vitals:   07/22/21 0000 07/22/21 0015 07/22/21 0030 07/22/21 0300  BP: (!) 78/66 99/73 113/61 94/68  Pulse: 70  (!) 143 (!) 137 (!) 54  Resp: 20 (!) 24 (!) 34 (!) 34  Temp:      TempSrc:      SpO2: 96% 94% 93% 94%  Weight:  108.9 kg    Height:  6\' 3"  (1.905 m)      Constitutional: Uncomfortable from L sided upper back pain, very ill appearing Eyes: PERRL, lids and conjunctivae normal ENMT: Mucous membranes are moist. Posterior pharynx clear of any exudate or lesions.Normal dentition.  Neck: normal, supple, no masses, no thyromegaly Respiratory: Tachypnic Cardiovascular: Tachycardic Abdomen: no tenderness, no masses palpated. No hepatosplenomegaly. Bowel sounds positive.  Musculoskeletal: no clubbing / cyanosis. No joint deformity upper and lower extremities. Good ROM, no contractures. Normal muscle tone.  Skin:    Neurologic: CN 2-12 grossly intact. Sensation intact, DTR normal. Strength 5/5 in all 4.  Psychiatric: Normal judgment and insight. Alert and oriented x 3. Normal mood.    Labs on Admission: I have personally reviewed following labs and imaging studies  CBC: Recent Labs  Lab 07/21/21 2003 07/22/21 0020 07/22/21 0021  WBC 13.0*  --   --   NEUTROABS 12.2*  --   --   HGB 17.9* 16.0 16.3  HCT 53.2* 47.0 48.0  MCV 84.4  --   --   PLT 141*  --   --    Basic Metabolic Panel: Recent Labs  Lab 07/21/21 2003 07/22/21 0020 07/22/21 0021 07/22/21 0059  NA 123* 124* 124* 121*  K 4.7 4.4 4.4 4.0  CL 87*  --  92* 90*  CO2 18*  --   --  16*  GLUCOSE 516*  --  459* 453*  BUN 40*  --  41* 42*  CREATININE 2.76*  --  2.30* 2.32*  CALCIUM 8.6*  --   --  7.9*   GFR: Estimated Creatinine Clearance: 48 mL/min (A) (by C-G formula based on SCr of 2.32 mg/dL (H)). Liver Function Tests: Recent Labs  Lab 07/21/21 2003  AST 28  ALT 40  ALKPHOS 242*  BILITOT 1.0  PROT 5.6*  ALBUMIN 2.0*   No results for input(s): LIPASE, AMYLASE in the last 168 hours. No results for input(s): AMMONIA in the last 168 hours. Coagulation Profile: Recent Labs  Lab 07/21/21 2003  INR 1.4*    Cardiac Enzymes: No results for input(s): CKTOTAL, CKMB, CKMBINDEX, TROPONINI in the last 168 hours. BNP (last 3 results) No results for input(s): PROBNP in the last 8760 hours. HbA1C: Recent Labs    07/22/21 0004  HGBA1C 13.8*   CBG: Recent Labs  Lab 07/22/21 0214 07/22/21 0258  GLUCAP 458* 460*   Lipid Profile: No results for input(s): CHOL, HDL, LDLCALC, TRIG, CHOLHDL, LDLDIRECT in the last 72 hours. Thyroid Function Tests: No results for input(s): TSH, T4TOTAL, FREET4, T3FREE, THYROIDAB in the last 72 hours. Anemia Panel: No results for input(s): VITAMINB12, FOLATE, FERRITIN,  TIBC, IRON, RETICCTPCT in the last 72 hours. Urine analysis:    Component Value Date/Time   COLORURINE YELLOW 07/22/2021 0250   APPEARANCEUR CLOUDY (A) 07/22/2021 0250   LABSPEC 1.018 07/22/2021 0250   PHURINE 5.0 07/22/2021 0250   GLUCOSEU >=500 (A) 07/22/2021 0250   HGBUR SMALL (A) 07/22/2021 0250   BILIRUBINUR NEGATIVE 07/22/2021 0250   KETONESUR 5 (A) 07/22/2021 0250   PROTEINUR 30 (A) 07/22/2021 0250   UROBILINOGEN 0.2 09/13/2019 0906   NITRITE NEGATIVE 07/22/2021 0250   LEUKOCYTESUR TRACE (A) 07/22/2021 0250    Radiological Exams on Admission: DG Chest 1 View  Result Date: 07/22/2021 EXAM: EXAM CHEST  1 VIEW COMPARISON:  Portable AP chest today at 8:08 p.m. FINDINGS: Provided are 2 separate lateral chest radiographs on this patient. There is a trace left pleural effusion but there is no convincing overlying infiltrate. Exaggerated thoracic kyphosis is noted with multilevel thoracic spine degenerative discs. IMPRESSION: Trace left pleural effusion visible on the lateral view, but no definitive adjacent infiltrate. Electronically Signed   By: Telford Nab M.D.   On: 07/22/2021 00:52   CT Foot Left Wo Contrast  Result Date: 07/22/2021 CLINICAL DATA:  Left foot infection EXAM: CT OF THE LEFT FOOT WITHOUT CONTRAST TECHNIQUE: Multidetector CT imaging of the left foot was performed  according to the standard protocol. Multiplanar CT image reconstructions were also generated. COMPARISON:  None. FINDINGS: Bones/Joint/Cartilage Transmetatarsal amputation of the first and second digits of the left foot are noted. Intra osseous gas and cortical erosion is seen involving the base of the residual proximal phalanx, metatarsal head, and sesamoid bones of the great toe in keeping with changes of osteomyelitis. Superimposed advanced degenerative arthritis of the first MTP joint. Remaining joint spaces are preserved. No acute fracture or dislocation. No additional sites of osseous erosion identified. Ligaments Suboptimally assessed by CT. Muscles and Tendons Unremarkable Soft tissues There is extensive soft tissue swelling involving the residual great toe with extensive subcutaneous gas surrounding the first MTP joint and extending medially and laterally into the plantar soft tissues subjacent to the second metatarsal. Moderate subcutaneous edema of the left foot and ankle. No discrete drainable subcutaneous fluid collection identified. Plantar ulcer noted within the residual great toe. IMPRESSION: Extensive subcutaneous gas in keeping with aggressive soft tissue infection and erosive changes surrounding the first MTP joint in keeping with septic arthritis/osteomyelitis in this location. No discrete drainable fluid collection identified. Electronically Signed   By: Fidela Salisbury M.D.   On: 07/22/2021 02:33   DG Chest Port 1 View  Result Date: 07/21/2021 CLINICAL DATA:  55 year old male with pain. EXAM: PORTABLE CHEST 1 VIEW COMPARISON:  None. FINDINGS: UPPER limits normal heart size noted. Equivocal LEFT LOWER lobe retrocardiac density/atelectasis may be present. There is no other evidence of focal airspace disease, pulmonary edema, suspicious pulmonary nodule/mass, pleural effusion, or pneumothorax. No acute bony abnormalities are identified. IMPRESSION: 1. Equivocal LEFT LOWER lobe retrocardiac  density/atelectasis. Recommend LATERAL chest radiograph if there is clinical suspicion for pneumonia. Electronically Signed   By: Margarette Canada M.D.   On: 07/21/2021 20:50   DG Toe Great Left  Result Date: 07/21/2021 CLINICAL DATA:  Left great toe pain, osteomyelitis EXAM: LEFT GREAT TOE COMPARISON:  None. FINDINGS: Three view radiograph left great toe demonstrates resection of the distal phalanx of the second digit and probable trans phalangeal amputation proximal phalanx of the great toe. There is a soft tissue ulcer involving the distal, medial, plantar aspect the residual left greatq toe.  There is subcutaneous gas within the a dorsal lateral soft tissues of the left great toe. There are periarticular lucencies within the a distal phalanx and distal aspect of the first metatarsal medially suspicious for changes of osteomyelitis/septic arthritis. Superimposed moderate degenerative arthritis of the first MTP joint. IMPRESSION: Postsurgical changes as described above. Subcutaneous gas within the dorsal lateral soft tissues at the level of the first MTP joint with subjacent periarticular lucency within the a base of the first proximal phalanx and distal aspect of the first metatarsal suspicious for changes of septic arthritis/osteomyelitis. This could be confirmed with contrast enhanced MRI examination. Electronically Signed   By: Fidela Salisbury M.D.   On: 07/21/2021 20:59    EKG: Independently reviewed.  Suspicious for A.Flutter  Assessment/Plan Principal Problem:   Severe sepsis with acute organ dysfunction (HCC) Active Problems:   Essential hypertension   Stage 3a chronic kidney disease (HCC)   Type 2 diabetes mellitus with diabetic polyneuropathy, with long-term current use of insulin (HCC)   Right below-knee amputee (HCC)   Diabetic infection of left foot (HCC)   Chronic diastolic (congestive) heart failure (HCC)   Chest pain   AKI (acute kidney injury) ([place])   Gas gangrene of foot (HCC)    Atrial flutter (HCC)    Severe sepsis with acute organ dysfunction - Patient meets criteria for sepsis at time of admission. Specifically the patient has at least 2 out of 4 SIRS criteria, namely: Tachycardia, tachypnea, WBC The currently suspected source of infection is Gas gangrene, septic arthritis, and osteomyelitis of L foot / great toe Lactic acid: 4.0 -> 2.9 -> 3.2 Blood pressure: 98 systolic currently IVF: 0.9F bolus, now 125 cc/hr Antibiotics: zosyn, vanc, clinda Cultures pending Trying to get a-hold of Ortho on call for Dr. Sharol Given given severity of infection, not sure if this qualifies as necrotizing fasciitis or just really bad soft tissue infection given gas findings on imaging. Sepsis pathway Tele monitor AKI on CKD 3a - Likely pre-renal IVF as above Strict intake and output Repeat BMP in AM A.Flutter - Trop only 54 despite 5 days of L upper back pain Dont think he will tolerate CCB or BB given severe sepsis with borderline shock already Trying to get a-hold of cards on call for recs On heparin gtt though this wasn't initially intended for A.Flutter DKA - Mild DKA with BHB of 2.5 DKA pathway Insulin gtt BMP Q4H ? Of PE - EDP started heparin gtt due to concern for PE Unable to CTA due to AKI Ordered VQ scan in AM dCHF - Not in acute exacerbation at this time HTN - Hold home BP meds due to sepsis and hypotension  DVT prophylaxis: heparin gtt Code Status: Full Family Communication: No family in room Disposition Plan: TBD Consults called: Trying to call Dr. Conley Canal and Dr. Jess Barters office Admission status: Admit to inpatient  CRITICAL CARE Performed by: Etta Quill.   Total critical care time: 120 minutes (0230 to 0430)  Critical care time was exclusive of separately billable procedures and treating other patients.  Critical care was necessary to treat or prevent imminent or life-threatening deterioration.  Critical care was time spent personally by  me on the following activities: development of treatment plan with patient and/or surrogate as well as nursing, discussions with consultants, evaluation of patient's response to treatment, examination of patient, obtaining history from patient or surrogate, ordering and performing treatments and interventions, ordering and review of laboratory studies, ordering and review of radiographic studies,  pulse oximetry and re-evaluation of patient's condition.   Alethia Melendrez Jerilynn Mages DO [facility]  How to contact the [facility] Attending or Consulting provider [place] or covering provider during after hours [place], for this patient?  Check the care team in [facility] and look for a) attending/consulting [facility] provider listed and b) the [facility] team listed Log into [email or web address]  Amion Physician Scheduling and messaging for groups and whole hospitals  On call and physician scheduling software for group practices, residents, hospitalists and other medical providers for call, clinic, rotation and shift schedules. OnCall [employer] is a hospital-wide system for scheduling doctors and paging doctors on call. EasyPlot is for scientific plotting and data analysis.  [email or web address]  and use [place]'s universal password to access. If you do not have the password, please contact the hospital operator.  Locate the [facility] provider you are looking for under [facility] and page to a number that you can be directly reached. If you still have difficulty reaching the provider, please page the [facility] (Director on Call) for the Hospitalists listed on amion for assistance.  07/22/2021, 3:27 AM

## 2021-07-22 NOTE — Anesthesia Preprocedure Evaluation (Addendum)
Anesthesia Evaluation  Patient identified by MRN, date of birth, ID band Patient awake    Reviewed: Allergy & Precautions, NPO status , Patient's Chart, lab work & pertinent test results, reviewed documented beta blocker date and time   Airway Mallampati: III  TM Distance: >3 FB Neck ROM: Full    Dental  (+) Teeth Intact, Dental Advisory Given   Pulmonary neg pulmonary ROS,    Pulmonary exam normal breath sounds clear to auscultation       Cardiovascular hypertension, Pt. on medications and Pt. on home beta blockers +CHF (grade 2 diastolic dysfunction- hasnt been taking his home lasix for a few days)  Normal cardiovascular exam+ Valvular Problems/Murmurs (mild MR) MR  Rhythm:Regular Rate:Normal  Echo 04/2021: 1. Left ventricular ejection fraction, by estimation, is 50 to 55%. The  left ventricle has low normal function. The left ventricle has no regional  wall motion abnormalities. Left ventricular diastolic parameters are  consistent with Grade II diastolic  dysfunction (pseudonormalization).  2. Right ventricular systolic function is normal. The right ventricular  size is normal.  3. Left atrial size was severely dilated.  4. Right atrial size was mildly dilated.  5. The mitral valve is normal in structure. Mild mitral valve  regurgitation. No evidence of mitral stenosis.  6. The aortic valve is normal in structure. Aortic valve regurgitation is  trivial. No aortic stenosis is present.    Neuro/Psych PSYCHIATRIC DISORDERS Anxiety Depression  Neuromuscular disease (peripheral neuropathy 2/2 poorly controlled T2DM)    GI/Hepatic negative GI ROS, Neg liver ROS,   Endo/Other  diabetes, Poorly Controlled, Type 2, Insulin Dependenta1c 13.8 Has been diabetic since 2016, on insulin  Renal/GU CRFRenal diseaseCr 2.5  negative genitourinary   Musculoskeletal negative musculoskeletal ROS (+)   Abdominal   Peds   Hematology negative hematology ROS (+) hct 41.9, plt 87   Anesthesia Other Findings Septic shock 2/2 diabetic foot infection- 5L in so far Strep agalactiae bacteremia Aflutter- on amio gtt, phenylephrine at [id number]/ZPH Hx diastolic CHF, noncompliant w/ home lasix  DM2 uncontrolled on insulin AKI on CKD4  Reproductive/Obstetrics negative OB ROS                            Anesthesia Physical Anesthesia Plan  ASA: 3  Anesthesia Plan: General and Regional   Post-op Pain Management: GA combined w/ Regional for post-op pain   Induction: Intravenous  PONV Risk Score and Plan: 2 and Midazolam, Dexamethasone, Ondansetron and Treatment may vary due to age or medical condition  Airway Management Planned: LMA  Additional Equipment:   Intra-op Plan:   Post-operative Plan: Extubation in OR  Informed Consent: I have reviewed the patients History and Physical, chart, labs and discussed the procedure including the risks, benefits and alternatives for the proposed anesthesia with the patient or authorized representative who has indicated his/her understanding and acceptance.     Dental advisory given  Plan Discussed with: CRNA  Anesthesia Plan Comments: (On phenylephrine @ 30)       Anesthesia Quick Evaluation

## 2021-07-22 NOTE — Progress Notes (Signed)
Received consult due to pt. starting Levophed. Spoke with RN, she stated pt. Has access currently. I notified her that the access with the levophed needed iWatch. She stated they do not have those in the ER and pt. Was being transferred to ICU.

## 2021-07-22 NOTE — ED Notes (Signed)
Dr. Broadus [name] aware that pt has had 4500 mL fluid resuscitation prior to ordering bolus. MD would still like to start the bolus at this time while waiting for CCM to evaluate pt for pressors.

## 2021-07-22 NOTE — H&P (View-Only) (Signed)
ORTHOPAEDIC CONSULTATION  REQUESTING PHYSICIAN: Etta Quill, DO  Chief Complaint: Infection left foot with thoracic back pain.  HPI: [name] is a 55 y.o. male who presents with acute history redness swelling and cellulitis of the left foot without red streaking up his leg.  Patient states he also has chronic lower back pain that is treated by Dr. Herma Mering with injections.  Patient states he is also on 10 mg of oxycodone on for his chronic back pain.  Past medical history includes uncontrolled type 2 diabetes congestive heart failure and protein caloric malnutrition.  Past Medical History:  Diagnosis Date   Chronic diastolic CHF (congestive heart failure) (HCC)    Diabetes mellitus without complication (HCC)    Hypertension    Past Surgical History:  Procedure Laterality Date   BELOW KNEE LEG AMPUTATION Right    Social History   Socioeconomic History   Marital status: Divorced    Spouse name: Not on file   Number of children: Not on file   Years of education: Not on file   Highest education level: Not on file  Occupational History   Not on file  Tobacco Use   Smoking status: Never   Smokeless tobacco: Never  Vaping Use   Vaping Use: Never used  Substance and Sexual Activity   Alcohol use: Never   Drug use: Never   Sexual activity: Yes  Other Topics Concern   Not on file  Social History Narrative   Not on file   Social Determinants of Health   Financial Resource Strain: Not on file  Food Insecurity: Not on file  Transportation Needs: Not on file  Physical Activity: Not on file  Stress: Not on file  Social Connections: Not on file   Family History  Problem Relation Age of Onset   Anxiety disorder Mother    Cancer Father    Coronary artery disease Father    - negative except otherwise stated in the family history section Allergies  Allergen Reactions   Bactrim [Sulfamethoxazole-Trimethoprim]    Ceprotin [Protein C Concentrate (Human)]     Ciprofloxacin Other (See Comments)    Kidney function   Levaquin [Levofloxacin]    Prior to Admission medications   Medication Sig Start Date End Date Taking? Authorizing Provider  furosemide (LASIX) 40 MG tablet Take 40 mg once daily for a weight under 240 lbs. For a weight over 240 lbs, take 80 mg (2 tablets) Patient taking differently: Take 40-80 mg by mouth daily. . For a weight over 240 lbs, take 80 mg (2 tablets) 05/16/21  Yes Croitoru, Mihai, MD  ibuprofen (ADVIL) 200 MG tablet Take 600-800 mg by mouth every 6 (six) hours as needed for headache or moderate pain.   Yes [provider]  insulin NPH Human (NOVOLIN N) 100 UNIT/ML injection Inject 0.15 mLs (15 Units total) into the skin at bedtime. Patient taking differently: Inject 20 Units into the skin See admin instructions. 3-4 times daily 10/12/19  Yes Shamleffer, Melanie Crazier, MD  lisinopril (ZESTRIL) 10 MG tablet Take 1 tablet (10 mg total) by mouth daily. 05/16/21 08/14/21 Yes Croitoru, Mihai, MD  Omega 3 1000 MG CAPS Take 1,000 mg by mouth daily.   Yes [provider]  Oxycodone HCl 10 MG TABS Take 10 mg by mouth 3 (three) times daily as needed (pain). 07/20/19  Yes [provider]  potassium chloride SA (KLOR-CON) 20 MEQ tablet Take 1 tablet (20 mEq total) by mouth daily. 04/24/21  Yes  Croitoru, Mihai, MD  sildenafil (VIAGRA) 100 MG tablet Take 100 mg by mouth daily as needed for erectile dysfunction. 02/05/21  Yes [provider]  metoprolol tartrate (LOPRESSOR) 100 MG tablet Take one tablet two hours before the test Patient not taking: No sig reported 05/16/21   Croitoru, Mihai, MD  sildenafil (VIAGRA) 50 MG tablet Take 1 tablet (50 mg total) by mouth daily as needed for erectile dysfunction. Patient not taking: Reported on 07/21/2021 09/25/19   Libby Maw, MD   DG Chest 1 View  Result Date: 07/22/2021 EXAM: EXAM CHEST  1 VIEW COMPARISON:  Portable AP chest today at 8:08 p.m. FINDINGS:  Provided are 2 separate lateral chest radiographs on this patient. There is a trace left pleural effusion but there is no convincing overlying infiltrate. Exaggerated thoracic kyphosis is noted with multilevel thoracic spine degenerative discs. IMPRESSION: Trace left pleural effusion visible on the lateral view, but no definitive adjacent infiltrate. Electronically Signed   By: Telford Nab M.D.   On: 07/22/2021 00:52   CT Foot Left Wo Contrast  Result Date: 07/22/2021 CLINICAL DATA:  Left foot infection EXAM: CT OF THE LEFT FOOT WITHOUT CONTRAST TECHNIQUE: Multidetector CT imaging of the left foot was performed according to the standard protocol. Multiplanar CT image reconstructions were also generated. COMPARISON:  None. FINDINGS: Bones/Joint/Cartilage Transmetatarsal amputation of the first and second digits of the left foot are noted. Intra osseous gas and cortical erosion is seen involving the base of the residual proximal phalanx, metatarsal head, and sesamoid bones of the great toe in keeping with changes of osteomyelitis. Superimposed advanced degenerative arthritis of the first MTP joint. Remaining joint spaces are preserved. No acute fracture or dislocation. No additional sites of osseous erosion identified. Ligaments Suboptimally assessed by CT. Muscles and Tendons Unremarkable Soft tissues There is extensive soft tissue swelling involving the residual great toe with extensive subcutaneous gas surrounding the first MTP joint and extending medially and laterally into the plantar soft tissues subjacent to the second metatarsal. Moderate subcutaneous edema of the left foot and ankle. No discrete drainable subcutaneous fluid collection identified. Plantar ulcer noted within the residual great toe. IMPRESSION: Extensive subcutaneous gas in keeping with aggressive soft tissue infection and erosive changes surrounding the first MTP joint in keeping with septic arthritis/osteomyelitis in this location. No  discrete drainable fluid collection identified. Electronically Signed   By: Fidela Salisbury M.D.   On: 07/22/2021 02:33   DG Chest Port 1 View  Result Date: 07/21/2021 CLINICAL DATA:  55 year old male with pain. EXAM: PORTABLE CHEST 1 VIEW COMPARISON:  None. FINDINGS: UPPER limits normal heart size noted. Equivocal LEFT LOWER lobe retrocardiac density/atelectasis may be present. There is no other evidence of focal airspace disease, pulmonary edema, suspicious pulmonary nodule/mass, pleural effusion, or pneumothorax. No acute bony abnormalities are identified. IMPRESSION: 1. Equivocal LEFT LOWER lobe retrocardiac density/atelectasis. Recommend LATERAL chest radiograph if there is clinical suspicion for pneumonia. Electronically Signed   By: Margarette Canada M.D.   On: 07/21/2021 20:50   DG Toe Great Left  Result Date: 07/21/2021 CLINICAL DATA:  Left great toe pain, osteomyelitis EXAM: LEFT GREAT TOE COMPARISON:  None. FINDINGS: Three view radiograph left great toe demonstrates resection of the distal phalanx of the second digit and probable trans phalangeal amputation proximal phalanx of the great toe. There is a soft tissue ulcer involving the distal, medial, plantar aspect the residual left greatq toe. There is subcutaneous gas within the a dorsal lateral soft tissues of the  left great toe. There are periarticular lucencies within the a distal phalanx and distal aspect of the first metatarsal medially suspicious for changes of osteomyelitis/septic arthritis. Superimposed moderate degenerative arthritis of the first MTP joint. IMPRESSION: Postsurgical changes as described above. Subcutaneous gas within the dorsal lateral soft tissues at the level of the first MTP joint with subjacent periarticular lucency within the a base of the first proximal phalanx and distal aspect of the first metatarsal suspicious for changes of septic arthritis/osteomyelitis. This could be confirmed with contrast enhanced MRI  examination. Electronically Signed   By: Fidela Salisbury M.D.   On: 07/21/2021 20:59   - pertinent xrays, CT, MRI studies were reviewed and independently interpreted  Positive ROS: All other systems have been reviewed and were otherwise negative with the exception of those mentioned in the HPI and as above.  Physical Exam: General: Alert, no acute distress Psychiatric: Patient is competent for consent with normal mood and affect Lymphatic: No axillary or cervical lymphadenopathy Cardiovascular: No pedal edema Respiratory: No cyanosis, no use of accessory musculature GI: No organomegaly, abdomen is soft and non-tender    Images:  @ENCIMAGES @  Labs:  Lab Results  Component Value Date   HGBA1C 13.8 (H) 07/22/2021   HGBA1C 8.6 (A) 10/11/2019   ESRSEDRATE 20 (H) 07/21/2021   CRP 23.2 (H) 07/21/2021   REPTSTATUS 09/16/2019 FINAL 09/13/2019   CULT 60,000 COLONIES/mL VIRIDANS STREPTOCOCCUS (A) 09/13/2019    Lab Results  Component Value Date   ALBUMIN 2.0 (L) 07/21/2021   ALBUMIN 4.0 05/09/2021   ALBUMIN 3.4 (L) 09/13/2019     CBC EXTENDED Latest Ref Rng & Units 07/22/2021 07/22/2021 07/22/2021  WBC 4.0 - 10.5 K/uL 11.0(H) - -  RBC 4.22 - 5.81 MIL/uL 5.43 - -  HGB 13.0 - 17.0 g/dL 15.7 16.3 16.0  HCT 39.0 - 52.0 % 46.3 48.0 47.0  PLT 150 - 400 K/uL 108(L) - -  NEUTROABS 1.7 - 7.7 K/uL - - -  LYMPHSABS 0.7 - 4.0 K/uL - - -    Neurologic: Patient does not have protective sensation bilateral lower extremities.   MUSCULOSKELETAL:   Skin: Examination patient has redness cellulitis and ulceration to the left foot.  The calf is soft nontender no cellulitis no warmth no crepitation with palpation of the calf or thigh.  The thigh is also soft nontender to palpation.  Patient's hemoglobin A1c is 13.8, white cell count 11, albumin 2.0.  Sed rate 21 protein of 23.2.  Radiographs of the left foot shows osteomyelitis of the great toe MTP joint or in the soft tissue.  Review of the  CT scan shows air in the soft tissue along the first metatarsal extending up to the base of the first metatarsal.  No air in the soft tissue through the midfoot or hindfoot.  Patient does have back pain that is exacerbated by a straight leg raise on the left.  There is a stable right transtibial amputation.  Assessment: Assessment: Uncontrolled type 2 diabetes with severe protein caloric malnutrition with acute abscess medial column left foot without clinical signs of infection in the calf or thigh.  Clinically patient may have developed a acute thoracic or lumbar discitis or endplate osteomyelitis secondary to his sepsis.  Plan: MRI scan is ordered for the thoracic and lumbar spine.  Patient is on IV antibiotics.  Anticipate proceeding with surgery tomorrow, Wednesday, for a left transtibial amputation.  Patient is reluctant to proceed with additional amputation but understands the importance.  Thank you  for the consult and the opportunity to see [name], [place] [phone] 6:49 AM

## 2021-07-22 NOTE — Consult Note (Signed)
ORTHOPAEDIC CONSULTATION  REQUESTING PHYSICIAN: Etta Quill, DO  Chief Complaint: Infection left foot with thoracic back pain.  HPI: [name] is a 55 y.o. male who presents with acute history redness swelling and cellulitis of the left foot without red streaking up his leg.  Patient states he also has chronic lower back pain that is treated by Dr. Herma Mering with injections.  Patient states he is also on 10 mg of oxycodone on for his chronic back pain.  Past medical history includes uncontrolled type 2 diabetes congestive heart failure and protein caloric malnutrition.  Past Medical History:  Diagnosis Date   Chronic diastolic CHF (congestive heart failure) (HCC)    Diabetes mellitus without complication (HCC)    Hypertension    Past Surgical History:  Procedure Laterality Date   BELOW KNEE LEG AMPUTATION Right    Social History   Socioeconomic History   Marital status: Divorced    Spouse name: Not on file   Number of children: Not on file   Years of education: Not on file   Highest education level: Not on file  Occupational History   Not on file  Tobacco Use   Smoking status: Never   Smokeless tobacco: Never  Vaping Use   Vaping Use: Never used  Substance and Sexual Activity   Alcohol use: Never   Drug use: Never   Sexual activity: Yes  Other Topics Concern   Not on file  Social History Narrative   Not on file   Social Determinants of Health   Financial Resource Strain: Not on file  Food Insecurity: Not on file  Transportation Needs: Not on file  Physical Activity: Not on file  Stress: Not on file  Social Connections: Not on file   Family History  Problem Relation Age of Onset   Anxiety disorder Mother    Cancer Father    Coronary artery disease Father    - negative except otherwise stated in the family history section Allergies  Allergen Reactions   Bactrim [Sulfamethoxazole-Trimethoprim]    Ceprotin [Protein C Concentrate (Human)]     Ciprofloxacin Other (See Comments)    Kidney function   Levaquin [Levofloxacin]    Prior to Admission medications   Medication Sig Start Date End Date Taking? Authorizing Provider  furosemide (LASIX) 40 MG tablet Take 40 mg once daily for a weight under 240 lbs. For a weight over 240 lbs, take 80 mg (2 tablets) Patient taking differently: Take 40-80 mg by mouth daily. . For a weight over 240 lbs, take 80 mg (2 tablets) 05/16/21  Yes Croitoru, Mihai, MD  ibuprofen (ADVIL) 200 MG tablet Take 600-800 mg by mouth every 6 (six) hours as needed for headache or moderate pain.   Yes [provider]  insulin NPH Human (NOVOLIN N) 100 UNIT/ML injection Inject 0.15 mLs (15 Units total) into the skin at bedtime. Patient taking differently: Inject 20 Units into the skin See admin instructions. 3-4 times daily 10/12/19  Yes Shamleffer, Melanie Crazier, MD  lisinopril (ZESTRIL) 10 MG tablet Take 1 tablet (10 mg total) by mouth daily. 05/16/21 08/14/21 Yes Croitoru, Mihai, MD  Omega 3 1000 MG CAPS Take 1,000 mg by mouth daily.   Yes [provider]  Oxycodone HCl 10 MG TABS Take 10 mg by mouth 3 (three) times daily as needed (pain). 07/20/19  Yes [provider]  potassium chloride SA (KLOR-CON) 20 MEQ tablet Take 1 tablet (20 mEq total) by mouth daily. 04/24/21  Yes  Croitoru, Mihai, MD  sildenafil (VIAGRA) 100 MG tablet Take 100 mg by mouth daily as needed for erectile dysfunction. 02/05/21  Yes [provider]  metoprolol tartrate (LOPRESSOR) 100 MG tablet Take one tablet two hours before the test Patient not taking: No sig reported 05/16/21   Croitoru, Mihai, MD  sildenafil (VIAGRA) 50 MG tablet Take 1 tablet (50 mg total) by mouth daily as needed for erectile dysfunction. Patient not taking: Reported on 07/21/2021 09/25/19   Libby Maw, MD   DG Chest 1 View  Result Date: 07/22/2021 EXAM: EXAM CHEST  1 VIEW COMPARISON:  Portable AP chest today at 8:08 p.m. FINDINGS:  Provided are 2 separate lateral chest radiographs on this patient. There is a trace left pleural effusion but there is no convincing overlying infiltrate. Exaggerated thoracic kyphosis is noted with multilevel thoracic spine degenerative discs. IMPRESSION: Trace left pleural effusion visible on the lateral view, but no definitive adjacent infiltrate. Electronically Signed   By: Telford Nab M.D.   On: 07/22/2021 00:52   CT Foot Left Wo Contrast  Result Date: 07/22/2021 CLINICAL DATA:  Left foot infection EXAM: CT OF THE LEFT FOOT WITHOUT CONTRAST TECHNIQUE: Multidetector CT imaging of the left foot was performed according to the standard protocol. Multiplanar CT image reconstructions were also generated. COMPARISON:  None. FINDINGS: Bones/Joint/Cartilage Transmetatarsal amputation of the first and second digits of the left foot are noted. Intra osseous gas and cortical erosion is seen involving the base of the residual proximal phalanx, metatarsal head, and sesamoid bones of the great toe in keeping with changes of osteomyelitis. Superimposed advanced degenerative arthritis of the first MTP joint. Remaining joint spaces are preserved. No acute fracture or dislocation. No additional sites of osseous erosion identified. Ligaments Suboptimally assessed by CT. Muscles and Tendons Unremarkable Soft tissues There is extensive soft tissue swelling involving the residual great toe with extensive subcutaneous gas surrounding the first MTP joint and extending medially and laterally into the plantar soft tissues subjacent to the second metatarsal. Moderate subcutaneous edema of the left foot and ankle. No discrete drainable subcutaneous fluid collection identified. Plantar ulcer noted within the residual great toe. IMPRESSION: Extensive subcutaneous gas in keeping with aggressive soft tissue infection and erosive changes surrounding the first MTP joint in keeping with septic arthritis/osteomyelitis in this location. No  discrete drainable fluid collection identified. Electronically Signed   By: Fidela Salisbury M.D.   On: 07/22/2021 02:33   DG Chest Port 1 View  Result Date: 07/21/2021 CLINICAL DATA:  55 year old male with pain. EXAM: PORTABLE CHEST 1 VIEW COMPARISON:  None. FINDINGS: UPPER limits normal heart size noted. Equivocal LEFT LOWER lobe retrocardiac density/atelectasis may be present. There is no other evidence of focal airspace disease, pulmonary edema, suspicious pulmonary nodule/mass, pleural effusion, or pneumothorax. No acute bony abnormalities are identified. IMPRESSION: 1. Equivocal LEFT LOWER lobe retrocardiac density/atelectasis. Recommend LATERAL chest radiograph if there is clinical suspicion for pneumonia. Electronically Signed   By: Margarette Canada M.D.   On: 07/21/2021 20:50   DG Toe Great Left  Result Date: 07/21/2021 CLINICAL DATA:  Left great toe pain, osteomyelitis EXAM: LEFT GREAT TOE COMPARISON:  None. FINDINGS: Three view radiograph left great toe demonstrates resection of the distal phalanx of the second digit and probable trans phalangeal amputation proximal phalanx of the great toe. There is a soft tissue ulcer involving the distal, medial, plantar aspect the residual left greatq toe. There is subcutaneous gas within the a dorsal lateral soft tissues of the  left great toe. There are periarticular lucencies within the a distal phalanx and distal aspect of the first metatarsal medially suspicious for changes of osteomyelitis/septic arthritis. Superimposed moderate degenerative arthritis of the first MTP joint. IMPRESSION: Postsurgical changes as described above. Subcutaneous gas within the dorsal lateral soft tissues at the level of the first MTP joint with subjacent periarticular lucency within the a base of the first proximal phalanx and distal aspect of the first metatarsal suspicious for changes of septic arthritis/osteomyelitis. This could be confirmed with contrast enhanced MRI  examination. Electronically Signed   By: Fidela Salisbury M.D.   On: 07/21/2021 20:59   - pertinent xrays, CT, MRI studies were reviewed and independently interpreted  Positive ROS: All other systems have been reviewed and were otherwise negative with the exception of those mentioned in the HPI and as above.  Physical Exam: General: Alert, no acute distress Psychiatric: Patient is competent for consent with normal mood and affect Lymphatic: No axillary or cervical lymphadenopathy Cardiovascular: No pedal edema Respiratory: No cyanosis, no use of accessory musculature GI: No organomegaly, abdomen is soft and non-tender    Images:  @ENCIMAGES @  Labs:  Lab Results  Component Value Date   HGBA1C 13.8 (H) 07/22/2021   HGBA1C 8.6 (A) 10/11/2019   ESRSEDRATE 20 (H) 07/21/2021   CRP 23.2 (H) 07/21/2021   REPTSTATUS 09/16/2019 FINAL 09/13/2019   CULT 60,000 COLONIES/mL VIRIDANS STREPTOCOCCUS (A) 09/13/2019    Lab Results  Component Value Date   ALBUMIN 2.0 (L) 07/21/2021   ALBUMIN 4.0 05/09/2021   ALBUMIN 3.4 (L) 09/13/2019     CBC EXTENDED Latest Ref Rng & Units 07/22/2021 07/22/2021 07/22/2021  WBC 4.0 - 10.5 K/uL 11.0(H) - -  RBC 4.22 - 5.81 MIL/uL 5.43 - -  HGB 13.0 - 17.0 g/dL 15.7 16.3 16.0  HCT 39.0 - 52.0 % 46.3 48.0 47.0  PLT 150 - 400 K/uL 108(L) - -  NEUTROABS 1.7 - 7.7 K/uL - - -  LYMPHSABS 0.7 - 4.0 K/uL - - -    Neurologic: Patient does not have protective sensation bilateral lower extremities.   MUSCULOSKELETAL:   Skin: Examination patient has redness cellulitis and ulceration to the left foot.  The calf is soft nontender no cellulitis no warmth no crepitation with palpation of the calf or thigh.  The thigh is also soft nontender to palpation.  Patient's hemoglobin A1c is 13.8, white cell count 11, albumin 2.0.  Sed rate 21 protein of 23.2.  Radiographs of the left foot shows osteomyelitis of the great toe MTP joint or in the soft tissue.  Review of the  CT scan shows air in the soft tissue along the first metatarsal extending up to the base of the first metatarsal.  No air in the soft tissue through the midfoot or hindfoot.  Patient does have back pain that is exacerbated by a straight leg raise on the left.  There is a stable right transtibial amputation.  Assessment: Assessment: Uncontrolled type 2 diabetes with severe protein caloric malnutrition with acute abscess medial column left foot without clinical signs of infection in the calf or thigh.  Clinically patient may have developed a acute thoracic or lumbar discitis or endplate osteomyelitis secondary to his sepsis.  Plan: MRI scan is ordered for the thoracic and lumbar spine.  Patient is on IV antibiotics.  Anticipate proceeding with surgery tomorrow, Wednesday, for a left transtibial amputation.  Patient is reluctant to proceed with additional amputation but understands the importance.  Thank you  for the consult and the opportunity to see [name], [place] [phone] 6:49 AM

## 2021-07-22 NOTE — Progress Notes (Signed)
[facility] Progress Note Patient Name: [name] DOB: [birth year] MRN: [id number]   Date of Service  07/22/2021  HPI/Events of Note  Anion gap is 10.  eICU Interventions  Insulin gtt discontinued. Patient is already on long acting , and coverage Insulin.        [name] [name] 07/22/2021, 10:18 PM

## 2021-07-22 NOTE — Progress Notes (Signed)
ANTICOAGULATION CONSULT NOTE - Follow Up Consult  Pharmacy Consult for heparin Indication:  r/o PE  Allergies  Allergen Reactions   Bactrim [Sulfamethoxazole-Trimethoprim]    Ceprotin [Protein C Concentrate (Human)]    Ciprofloxacin Other (See Comments)    Kidney function   Levaquin [Levofloxacin]     Patient Measurements: Height: 6\' 3"  (190.5 cm) Weight: 108.9 kg (240 lb) (Sept 2022 records) IBW/kg (Calculated) : 84.5  Vital Signs: Temp: 98.2 F (36.8 C) (11/15 0715) Temp Source: Oral (11/15 0715) BP: 95/54 (11/15 0936) Pulse Rate: 151 (11/15 0936)  Labs: Recent Labs    07/21/21 2003 07/22/21 0020 07/22/21 0021 07/22/21 0059 07/22/21 0246 07/22/21 0350 07/22/21 0645 07/22/21 0920  HGB 17.9* 16.0 16.3  --   --  15.7  --   --   HCT 53.2* 47.0 48.0  --   --  46.3  --   --   PLT 141*  --   --   --   --  108*  --   --   APTT 24  --   --   --   --   --   --   --   LABPROT 16.8*  --   --   --   --  16.5*  --   --   INR 1.4*  --   --   --   --  1.3*  --   --   HEPARINUNFRC  --   --   --   --   --   --   --  <0.10*  CREATININE 2.76*  --  2.30* 2.32*  --  2.32*  --  2.56*  TROPONINIHS  --   --   --   --  52*  --  44*  --      Estimated Creatinine Clearance: 43.5 mL/min (A) (by C-G formula based on SCr of 2.56 mg/dL (H)).   Medical History: Past Medical History:  Diagnosis Date   Chronic diastolic CHF (congestive heart failure) (HCC)    Diabetes mellitus without complication ([place])    Hypertension     Assessment: 55yo male c/o L foot infection, tachypneic on arrival, started on ABX for sepsis, remains tachypneic/tachycardic despite sepsis resuscitation. Heparin started concern for PE. Also found to have new onset aflutter with RVR.   Heparin level undetectable on heparin 1700 units/hr. Per RN no issues with IV infusion or access. Hgb 15.7. Plt 108 - decrease from 141 on admission.   Goal of Therapy:  Heparin level 0.3-0.7 units/ml Monitor platelets by  anticoagulation protocol: Yes   Plan:  Heparin 2000 unit bolus Increase heparin to 2000 units/hr  Check 6 hr heparin level  Watch plts closely  Monitor heparin level, CBC and s/s of allergy  Cristela Felt, PharmD, BCPS Clinical Pharmacist 07/22/2021 10:55 AM

## 2021-07-22 NOTE — ED Provider Notes (Signed)
ATTENDING SUPERVISORY NOTE I have personally viewed the imaging studies performed. I have personally seen and examined the patient, and discussed the plan of care with the resident.  I have reviewed the documentation of the resident and agree.  Sepsis ([place]) - Plan: DG Chest [place] [address], DG Chest [place] 1 [place], DG Chest 1 View, DG Chest 1 View  .Critical Care Performed by: Elnora Morrison, MD Authorized by: Elnora Morrison, MD   Critical care provider statement:    Critical care time (minutes):  80   Critical care start time:  07/21/2021 10:40 AM   Critical care end time:  07/22/2021 12:00 AM   Critical care time was exclusive of:  Separately billable procedures and treating other patients and teaching time   Critical care was necessary to treat or prevent imminent or life-threatening deterioration of the following conditions:  Sepsis   Critical care was time spent personally by me on the following activities:  Evaluation of patient's response to treatment, examination of patient, ordering and review of radiographic studies, ordering and review of laboratory studies, pulse oximetry and re-evaluation of patient's condition    Elnora Morrison, MD 07/24/21 [phone]

## 2021-07-22 NOTE — Progress Notes (Signed)
eLink Physician-Brief Progress Note Patient Name: [name] DOB: 19-Oct-1965 MRN: [id number]   Date of Service  07/22/2021  HPI/Events of Note  Patient is on long acting Insulin, sliding scale Insulin, and Insulin gtt, most recent BMP assessing anion gap was at 9 AM.  eICU Interventions  Will send stat BMP, then proceed from there.        [name] Cerise Lieber 07/22/2021, 8:10 PM

## 2021-07-22 NOTE — Consult Note (Signed)
NAME:  [name], MRN:  [id number], DOB:  March 03, 1966, LOS: 0 ADMISSION DATE:  07/21/2021, CONSULTATION DATE:  07/22/2021 REFERRING MD:  Broadus [name], CHIEF COMPLAINT:  Septic shock   History of Present Illness:  [name] is a 55 y.o. M who presented with concern of left leg infection. He states that about 5 days ago he stepped on an unknown object which he feels caused a wound to the area of previous L great toe amputation site resulting in an ulcer that over the last 3 days has become more purulent, erythematous, drainage, and with worsened pain and swelling. He has had shortness of breath, fatigue, and chills. He has been receiving fluid resuscitation to this point without appropriate hemodynamic response. PCCM was consulted for transfer and pressor support.   Pertinent  Medical History  poorly controlled type 2 diabetes mellitus, diabetic foot infection with prior right BKA, chronic diastolic CHF, hypertension, dyslipidemia  Significant Hospital Events: Including procedures, antibiotic start and stop dates in addition to other pertinent events   11/14 Admitted  Interim History / Subjective:  Patient describes feeling better since admission however feeling generally poorly still. His new back pain is what is bothering him most at this time.  Objective   Blood pressure (!) 82/43, pulse (!) 147, temperature 98.4 F (36.9 C), temperature source Oral, resp. rate (!) 32, height 6\' 3"  (1.905 m), weight 108.9 kg, SpO2 95 %.        Intake/Output Summary (Last 24 hours) at 07/22/2021 1310 Last data filed at 07/22/2021 1228 Gross per 24 hour  Intake 5330 ml  Output 400 ml  Net 4930 ml   Filed Weights   07/22/21 0015  Weight: 108.9 kg    Examination: Constitutional: Chronically ill appearing gentleman in acute distress. Cardio: Tachycardic with regular rhythm. No murmurs, rubs, gallops. Pulm: Clear to auscultation bilaterally.  Abdomen: Soft, non-tender, non-distended. MSK: S/p  R BKA. L foot with plantar wound that probes 1.5 cm. Skin: Skin is cool and dry with the exception of surrounding diabetic foot wound on L foot which is warm and erythematous with single bullae and peeling skin. Neuro: Alert and oriented x3. No focal deficit noted. Psych: Flat mood.       Resolved Hospital Problem list   A. Fib with RVR DKA  Assessment & Plan:  Septic shock Diabetic foot infection Strep agalactiae bacteremia Patient states that 5 days ago he stepped on something and the appearance of his L foot has since quickly declined. The plantar first joint wound probes 1.5 cm and wounds on dorsal aspect of foot do not probe. Skin is erythemetous and warm with blistering on dorsal aspect of foot. CT foot concerning for extensive subcutaneous gas with aggressive soft tissue infection and erosive changes surrounding the first MTP joint. S/p 4.5 L LR bolus. Lactate of 3.0 at 0645. Procalcitonin 21.90 concerning for sever bacterial sepsis or septic shock. CRP 23.2. Sed rate 20.  Blood cultures growing Strep agalactiae. - S/p vancomycin 2000 mg, zosyn 3.375 g - S/p ceftriaxone 2 g - Continue zosyn 3.375 g IV for 10 days - Continue clindamycin 600 mg IV - Levophed titrated for MAP goal > 65 - Continue LR 100 mL/hr - Hyrdocortisone 100 mg q12h - Repeat lactate - Consult ID  Back pain, severe Patient explains that he is followed for his chronic low back pain as an outpatient however he is having new onset severe mid-back pain. - F/u MRI thoracic, lumbar spine  AKI on CKD IIIa  Septic ATN Hyponatremia Baseline Cr appears to be closer to 1.80 (05/2021) and is measuring at 2.56 at this time. Patient is s/p 4.5 L LR fluid resuscitation.   Uncontrolled diabetes mellitus DKA, resolved Patient endorses recently uncontrolled BG levels. HbA1c of 13.8%. On admission he appeared to be in mild DKA however this has resolved with insulin drip and fluid resuscitation.  - Stop Levemir 25 units  daily - SSI - CBG q4h  CHF Patient does not appear to be grossly volume overloaded. We will avoid diuresing him given his persistent hypotension unresponsive to fluid resuscitation.   Best Practice (right click and "Reselect all SmartList Selections" daily)   Diet/type: full liquids  DVT prophylaxis: not indicated GI prophylaxis: N/A Lines: N/A Foley:  N/A Code Status:  full code Last date of multidisciplinary goals of care discussion [11/15]  Labs   CBC: Recent Labs  Lab 07/21/21 2003 07/22/21 0020 07/22/21 0021 07/22/21 0350  WBC 13.0*  --   --  11.0*  NEUTROABS 12.2*  --   --   --   HGB 17.9* 16.0 16.3 15.7  HCT 53.2* 47.0 48.0 46.3  MCV 84.4  --   --  85.3  PLT 141*  --   --  108*    Basic Metabolic Panel: Recent Labs  Lab 07/21/21 2003 07/22/21 0020 07/22/21 0021 07/22/21 0059 07/22/21 0350 07/22/21 0920  NA 123* 124* 124* 121* 125* 126*  K 4.7 4.4 4.4 4.0 4.1 3.9  CL 87*  --  92* 90* 93* 91*  CO2 18*  --   --  16* 17* 23  GLUCOSE 516*  --  459* 453* 390* 180*  BUN 40*  --  41* 42* 43* 45*  CREATININE 2.76*  --  2.30* 2.32* 2.32* 2.56*  CALCIUM 8.6*  --   --  7.9* 8.0* 8.0*   GFR: Estimated Creatinine Clearance: 43.5 mL/min (A) (by C-G formula based on SCr of 2.56 mg/dL (H)). Recent Labs  Lab 07/21/21 2003 07/21/21 2257 07/22/21 0300 07/22/21 0350 07/22/21 0645  PROCALCITON  --   --   --  21.90  --   WBC 13.0*  --   --  11.0*  --   LATICACIDVEN 4.0* 2.9* 3.2*  --  3.0*    Liver Function Tests: Recent Labs  Lab 07/21/21 2003  AST 28  ALT 40  ALKPHOS 242*  BILITOT 1.0  PROT 5.6*  ALBUMIN 2.0*   No results for input(s): LIPASE, AMYLASE in the last 168 hours. No results for input(s): AMMONIA in the last 168 hours.  ABG    Component Value Date/Time   HCO3 21.5 07/22/2021 0020   TCO2 21 (L) 07/22/2021 0021   ACIDBASEDEF 4.0 (H) 07/22/2021 0020   O2SAT 43.0 07/22/2021 0020     Coagulation Profile: Recent Labs  Lab 07/21/21 2003  07/22/21 0350  INR 1.4* 1.3*    Cardiac Enzymes: No results for input(s): CKTOTAL, CKMB, CKMBINDEX, TROPONINI in the last 168 hours.  HbA1C: Hemoglobin A1C  Date/Time Value Ref Range Status  10/11/2019 02:14 PM 8.6 (A) 4.0 - 5.6 % Final   Hgb A1c MFr Bld  Date/Time Value Ref Range Status  07/22/2021 12:04 AM 13.8 (H) 4.8 - 5.6 % Final    Comment:    (NOTE) Pre diabetes:          5.7%-6.4%  Diabetes:              >6.4%  Glycemic control for   <7.0% adults with  diabetes     CBG: Recent Labs  Lab 07/22/21 0830 07/22/21 0937 07/22/21 1036 07/22/21 1143 07/22/21 1245  GLUCAP 208* 187* 181* 175* 164*    Review of Systems:   Review of Systems  Constitutional:  Positive for chills, diaphoresis and malaise/fatigue. Negative for fever.  Respiratory:  Negative for cough and shortness of breath.   Cardiovascular:  Positive for palpitations. Negative for chest pain.  Musculoskeletal:  Positive for back pain.    Past Medical History:  He,  has a past medical history of Chronic diastolic CHF (congestive heart failure) ([place]), Diabetes mellitus without complication ([place]), and Hypertension.   Surgical History:   Past Surgical History:  Procedure Laterality Date   BELOW KNEE LEG AMPUTATION Right      Social History:   reports that he has never smoked. He has never used smokeless tobacco. He reports that he does not drink alcohol and does not use drugs.   Family History:  His family history includes Anxiety disorder in his mother; Cancer in his father; Coronary artery disease in his father.   Allergies Allergies  Allergen Reactions   Bactrim [Sulfamethoxazole-Trimethoprim]    Ceprotin [Protein C Concentrate (Human)]    Ciprofloxacin Other (See Comments)    Kidney function   Levaquin [Levofloxacin]      Home Medications  Prior to Admission medications   Medication Sig Start Date End Date Taking? Authorizing Provider  furosemide (LASIX) 40 MG tablet Take 40 mg  once daily for a weight under 240 lbs. For a weight over 240 lbs, take 80 mg (2 tablets) Patient taking differently: Take 40-80 mg by mouth daily. . For a weight over 240 lbs, take 80 mg (2 tablets) 05/16/21  Yes Croitoru, Mihai, MD  ibuprofen (ADVIL) 200 MG tablet Take 600-800 mg by mouth every 6 (six) hours as needed for headache or moderate pain.   Yes [provider]  insulin NPH Human (NOVOLIN N) 100 UNIT/ML injection Inject 0.15 mLs (15 Units total) into the skin at bedtime. Patient taking differently: Inject 20 Units into the skin See admin instructions. 3-4 times daily 10/12/19  Yes Shamleffer, Melanie Crazier, MD  lisinopril (ZESTRIL) 10 MG tablet Take 1 tablet (10 mg total) by mouth daily. 05/16/21 08/14/21 Yes Croitoru, Mihai, MD  Omega 3 1000 MG CAPS Take 1,000 mg by mouth daily.   Yes [provider]  Oxycodone HCl 10 MG TABS Take 10 mg by mouth 3 (three) times daily as needed (pain). 07/20/19  Yes [provider]  potassium chloride SA (KLOR-CON) 20 MEQ tablet Take 1 tablet (20 mEq total) by mouth daily. 04/24/21  Yes Croitoru, Mihai, MD  sildenafil (VIAGRA) 100 MG tablet Take 100 mg by mouth daily as needed for erectile dysfunction. 02/05/21  Yes [provider]  metoprolol tartrate (LOPRESSOR) 100 MG tablet Take one tablet two hours before the test Patient not taking: No sig reported 05/16/21   Croitoru, Mihai, MD  sildenafil (VIAGRA) 50 MG tablet Take 1 tablet (50 mg total) by mouth daily as needed for erectile dysfunction. Patient not taking: Reported on 07/21/2021 09/25/19   Libby Maw, MD     Critical care time: 45 minutes

## 2021-07-22 NOTE — ED Notes (Addendum)
ED TO INPATIENT HANDOFF REPORT  ED Nurse Name and Phone #: [place] [phone]  S Name/Age/Gender [name] 55 y.o. male Room/Bed: 020C/020C  Code Status   Code Status: Full Code  Home/SNF/Other Home Patient oriented to: self, place, time, and situation Is this baseline? Yes   Triage Complete: Triage complete  Chief Complaint Severe sepsis with acute organ dysfunction ([place]) [A41.9, R65.20] Septic shock ([place]) [A41.9, R65.21]  Triage Note Patient here with infected left foot.  Patient is breathing 30 times a minute.  Patient states that the infection has been going on a few days.  Patient is a diabetic with CHF.  Patient is a bka on the right.    Allergies Allergies  Allergen Reactions   Bactrim [Sulfamethoxazole-Trimethoprim]    Ceprotin [Protein C Concentrate (Human)]    Ciprofloxacin Other (See Comments)    Kidney function   Levaquin [Levofloxacin]     Level of Care/Admitting Diagnosis ED Disposition     ED Disposition  Admit   Condition  --   Comment  Hospital Area: [facility] [[id number]]  Level of Care: ICU [6]  May admit patient to [facility] or [facility] if equivalent level of care is available:: Yes  Covid Evaluation: Confirmed COVID Negative  Diagnosis: Septic shock [facility]) [[id number]]  Admitting Physician: Jacky Kindle [[id number]]  Attending Physician: Jacky Kindle [[id number]]  Estimated length of stay: 5 - 7 days  Certification:: I certify this patient will need inpatient services for at least 2 midnights          B Medical/Surgery History Past Medical History:  Diagnosis Date   Chronic diastolic CHF (congestive heart failure) ([place])    Diabetes mellitus without complication ([place])    Hypertension    Past Surgical History:  Procedure Laterality Date   BELOW KNEE LEG AMPUTATION Right      A IV Location/Drains/Wounds Patient Lines/Drains/Airways Status     Active Line/Drains/Airways     Name Placement date Placement time  Site Days   Peripheral IV 07/21/21 18 G Anterior;Right Antecubital 07/21/21  2000  Antecubital  1   Peripheral IV 07/21/21 20 G Right;Lateral Antecubital 07/21/21  2035  Antecubital  1   Peripheral IV 07/22/21 18 G Anterior;Distal;Left;Upper Arm 07/22/21  0130  Arm  less than 1   Peripheral IV 07/22/21 22 G Left;Posterior Hand 07/22/21  0916  Hand  less than 1            Intake/Output Last 24 hours  Intake/Output Summary (Last 24 hours) at 07/22/2021 1512 Last data filed at 07/22/2021 1407 Gross per 24 hour  Intake 5398.05 ml  Output 400 ml  Net 4998.05 ml    Labs/Imaging Results for orders placed or performed during the hospital encounter of 07/21/21 (from the past 48 hour(s))  Resp Panel by RT-PCR (Flu A&B, Covid) Nasopharyngeal Swab     Status: None   Collection Time: 07/21/21  7:54 PM   Specimen: Nasopharyngeal Swab; Nasopharyngeal(NP) swabs in vial transport medium  Result Value Ref Range   SARS Coronavirus 2 by RT PCR NEGATIVE NEGATIVE    Comment: (NOTE) SARS-CoV-2 target nucleic acids are NOT DETECTED.  The SARS-CoV-2 RNA is generally detectable in upper respiratory specimens during the acute phase of infection. The lowest concentration of SARS-CoV-2 viral copies this assay can detect is 138 copies/mL. A negative result does not preclude SARS-Cov-2 infection and should not be used as the sole basis for treatment or other patient management decisions. A negative result may occur  with  improper specimen collection/handling, submission of specimen other than nasopharyngeal swab, presence of viral mutation(s) within the areas targeted by this assay, and inadequate number of viral copies(<138 copies/mL). A negative result must be combined with clinical observations, patient history, and epidemiological information. The expected result is Negative.  Fact Sheet for Patients:  EntrepreneurPulse.com.au  Fact Sheet for Healthcare Providers:   IncredibleEmployment.be  This test is no t yet approved or cleared by the [place] FDA and  has been authorized for detection and/or diagnosis of SARS-CoV-2 by FDA under an Emergency Use Authorization (EUA). This EUA will remain  in effect (meaning this test can be used) for the duration of the COVID-19 declaration under Section 564(b)(1) of the Act, 21 U.S.C.section 360bbb-3(b)(1), unless the authorization is terminated  or revoked sooner.       Influenza A by PCR NEGATIVE NEGATIVE   Influenza B by PCR NEGATIVE NEGATIVE    Comment: (NOTE) The Xpert Xpress SARS-CoV-2/FLU/RSV plus assay is intended as an aid in the diagnosis of influenza from Nasopharyngeal swab specimens and should not be used as a sole basis for treatment. Nasal washings and aspirates are unacceptable for Xpert Xpress SARS-CoV-2/FLU/RSV testing.  Fact Sheet for Patients: EntrepreneurPulse.com.au  Fact Sheet for Healthcare Providers: IncredibleEmployment.be  This test is not yet approved or cleared by the [place] FDA and has been authorized for detection and/or diagnosis of SARS-CoV-2 by FDA under an Emergency Use Authorization (EUA). This EUA will remain in effect (meaning this test can be used) for the duration of the COVID-19 declaration under Section 564(b)(1) of the Act, 21 U.S.C. section 360bbb-3(b)(1), unless the authorization is terminated or revoked.  Performed at [facility], [place] [address] [place], [place] [address]   Culture, blood (Routine x 2)     Status: None (Preliminary result)   Collection Time: 07/21/21  8:00 PM   Specimen: BLOOD  Result Value Ref Range   Specimen Description BLOOD RIGHT ANTECUBITAL    Special Requests      BOTTLES DRAWN AEROBIC AND ANAEROBIC Blood Culture adequate volume   Culture  Setup Time      GRAM POSITIVE COCCI IN CHAINS IN BOTH AEROBIC AND ANAEROBIC BOTTLES Performed at [facility], [place] [address], [place],  [address]    Culture GRAM POSITIVE COCCI    Report Status PENDING   Comprehensive metabolic panel     Status: Abnormal   Collection Time: 07/21/21  8:03 PM  Result Value Ref Range   Sodium 123 (L) 135 - 145 mmol/L   Potassium 4.7 3.5 - 5.1 mmol/L   Chloride 87 (L) 98 - 111 mmol/L   CO2 18 (L) 22 - 32 mmol/L   Glucose, Bld 516 (HH) 70 - 99 mg/dL    Comment: Glucose reference range applies only to samples taken after fasting for at least 8 hours. CRITICAL RESULT CALLED TO, READ BACK BY AND VERIFIED WITH: GRACE TATE RN 07/21/21 2121 M KOROLESKI    BUN 40 (H) 6 - 20 mg/dL   Creatinine, Ser 2.76 (H) 0.61 - 1.24 mg/dL   Calcium 8.6 (L) 8.9 - 10.3 mg/dL   Total Protein 5.6 (L) 6.5 - 8.1 g/dL   Albumin 2.0 (L) 3.5 - 5.0 g/dL   AST 28 15 - 41 U/L   ALT 40 0 - 44 U/L   Alkaline Phosphatase 242 (H) 38 - 126 U/L   Total Bilirubin 1.0 0.3 - 1.2 mg/dL   GFR, Estimated 26 (L) >  60 mL/min    Comment: (NOTE) Calculated using the CKD-EPI Creatinine Equation (2021)    Anion gap 18 (H) 5 - 15    Comment: Performed at [facility], [place] [address], [place], [place] [address]  Lactic acid, plasma     Status: Abnormal   Collection Time: 07/21/21  8:03 PM  Result Value Ref Range   Lactic Acid, Venous 4.0 (HH) 0.5 - 1.9 mmol/L    Comment: CRITICAL RESULT CALLED TO, READ BACK BY AND VERIFIED WITH: GRACE TATE RN 07/21/21 2121 Wiliam Ke Performed at [facility], [place] [address] [place], [place] [address]   CBC with Differential     Status: Abnormal   Collection Time: 07/21/21  8:03 PM  Result Value Ref Range   WBC 13.0 (H) 4.0 - 10.5 K/uL   RBC 6.30 (H) 4.22 - 5.81 MIL/uL   Hemoglobin 17.9 (H) 13.0 - 17.0 g/dL   HCT 53.2 (H) 39.0 - 52.0 %   MCV 84.4 80.0 - 100.0 fL   MCH 28.4 26.0 - 34.0 pg   MCHC 33.6 30.0 - 36.0 g/dL   RDW 14.6 11.5 - 15.5 %   Platelets 141 (L) 150 - 400 K/uL   nRBC 0.0 0.0 - 0.2 %   Neutrophils Relative % 94 %    Neutro Abs 12.2 (H) 1.7 - 7.7 K/uL   Lymphocytes Relative 1 %   Lymphs Abs 0.1 (L) 0.7 - 4.0 K/uL   Monocytes Relative 3 %   Monocytes Absolute 0.4 0.1 - 1.0 K/uL   Eosinophils Relative 0 %   Eosinophils Absolute 0.0 0.0 - 0.5 K/uL   Basophils Relative 0 %   Basophils Absolute 0.0 0.0 - 0.1 K/uL   nRBC 0 0 /100 WBC   Metamyelocytes Relative 2 %   Abs Immature Granulocytes 0.30 (H) 0.00 - 0.07 K/uL    Comment: Performed at [facility], [place] [address], [place], [place] [address]  Culture, blood (Routine x 2)     Status: None (Preliminary result)   Collection Time: 07/21/21  8:03 PM   Specimen: BLOOD  Result Value Ref Range   Specimen Description BLOOD LEFT ARM    Special Requests      BOTTLES DRAWN AEROBIC AND ANAEROBIC Blood Culture adequate volume   Culture  Setup Time      GRAM POSITIVE COCCI IN CHAINS IN BOTH AEROBIC AND ANAEROBIC BOTTLES CRITICAL RESULT CALLED TO, READ BACK BY AND VERIFIED WITH: PHARMD B BOLDEN [id number] AT 830 AM BY CM Performed at [facility], [place] [address], [place], [place] [address]    Culture GRAM POSITIVE COCCI    Report Status PENDING   Protime-INR     Status: Abnormal   Collection Time: 07/21/21  8:03 PM  Result Value Ref Range   Prothrombin Time 16.8 (H) 11.4 - 15.2 seconds   INR 1.4 (H) 0.8 - 1.2    Comment: (NOTE) INR goal varies based on device and disease states. Performed at [facility], [place] [address], [place], [place] [address]   APTT     Status: None   Collection Time: 07/21/21  8:03 PM  Result Value Ref Range   aPTT 24 24 - 36 seconds    Comment: Performed at [facility] [address], [place],  [address]  Blood Culture ID Panel (Reflexed)     Status: Abnormal   Collection Time: 07/21/21  8:03 PM  Result Value Ref  Range   Enterococcus faecalis NOT DETECTED NOT DETECTED   Enterococcus Faecium NOT DETECTED NOT DETECTED   Listeria monocytogenes NOT DETECTED NOT DETECTED   Staphylococcus species NOT  DETECTED NOT DETECTED   Staphylococcus aureus (BCID) NOT DETECTED NOT DETECTED   Staphylococcus epidermidis NOT DETECTED NOT DETECTED   Staphylococcus lugdunensis NOT DETECTED NOT DETECTED   Streptococcus species DETECTED (A) NOT DETECTED    Comment: CRITICAL RESULT CALLED TO, READ BACK BY AND VERIFIED WITH: PHARMD B BOLDEN [id number] AT 923 AM BY CM    Streptococcus agalactiae DETECTED (A) NOT DETECTED    Comment: CRITICAL RESULT CALLED TO, READ BACK BY AND VERIFIED WITH: PHARMD B BOLDEN [id number] AT 923 AM BY CM    Streptococcus pneumoniae NOT DETECTED NOT DETECTED   Streptococcus pyogenes NOT DETECTED NOT DETECTED   A.calcoaceticus-baumannii NOT DETECTED NOT DETECTED   Bacteroides fragilis NOT DETECTED NOT DETECTED   Enterobacterales NOT DETECTED NOT DETECTED   Enterobacter cloacae complex NOT DETECTED NOT DETECTED   Escherichia coli NOT DETECTED NOT DETECTED   Klebsiella aerogenes NOT DETECTED NOT DETECTED   Klebsiella oxytoca NOT DETECTED NOT DETECTED   Klebsiella pneumoniae NOT DETECTED NOT DETECTED   Proteus species NOT DETECTED NOT DETECTED   Salmonella species NOT DETECTED NOT DETECTED   Serratia marcescens NOT DETECTED NOT DETECTED   Haemophilus influenzae NOT DETECTED NOT DETECTED   Neisseria meningitidis NOT DETECTED NOT DETECTED   Pseudomonas aeruginosa NOT DETECTED NOT DETECTED   Stenotrophomonas maltophilia NOT DETECTED NOT DETECTED   Candida albicans NOT DETECTED NOT DETECTED   Candida auris NOT DETECTED NOT DETECTED   Candida glabrata NOT DETECTED NOT DETECTED   Candida krusei NOT DETECTED NOT DETECTED   Candida parapsilosis NOT DETECTED NOT DETECTED   Candida tropicalis NOT DETECTED NOT DETECTED   Cryptococcus neoformans/gattii NOT DETECTED NOT DETECTED    Comment: Performed at [facility], [address], [place], [place] [address]  Sedimentation rate     Status: Abnormal   Collection Time: 07/21/21  8:33 PM  Result Value Ref Range   Sed Rate 20 (H) 0 - 16  mm/hr    Comment: Performed at [place] [address] [place], [place] [address]  C-reactive protein     Status: Abnormal   Collection Time: 07/21/21  8:33 PM  Result Value Ref Range   CRP 23.2 (H) <1.0 mg/dL    Comment: Performed at [facility] [address] [place], [place] [address]  Beta-hydroxybutyric acid     Status: Abnormal   Collection Time: 07/21/21 10:09 PM  Result Value Ref Range   Beta-Hydroxybutyric Acid 2.53 (H) 0.05 - 0.27 mmol/L    Comment: Performed at [facility] [address] [place], [place] [address]  Lactic acid, plasma     Status: Abnormal   Collection Time: 07/21/21 10:57 PM  Result Value Ref Range   Lactic Acid, Venous 2.9 (HH) 0.5 - 1.9 mmol/L    Comment: CRITICAL VALUE NOTED.  VALUE IS CONSISTENT WITH PREVIOUSLY REPORTED AND CALLED VALUE. Performed at [facility], [place] [address], [place],  [address]   Hemoglobin A1c     Status: Abnormal   Collection Time: 07/22/21 12:04 AM  Result Value Ref Range   Hgb A1c MFr Bld 13.8 (H) 4.8 - 5.6 %    Comment: (NOTE) Pre diabetes:          5.7%-6.4%  Diabetes:              >  6.4%  Glycemic control for   <7.0% adults with diabetes    Mean Plasma Glucose 349.36 mg/dL    Comment: Performed at [facility] [address], [place], [place] [address]  I-Stat venous blood gas, ED     Status: Abnormal   Collection Time: 07/22/21 12:20 AM  Result Value Ref Range   pH, Ven 7.358 7.250 - 7.430   pCO2, Ven 38.3 (L) 44.0 - 60.0 mmHg   pO2, Ven 25.0 (LL) 32.0 - 45.0 mmHg   Bicarbonate 21.5 20.0 - 28.0 mmol/L   TCO2 23 22 - 32 mmol/L   O2 Saturation 43.0 %   Acid-base deficit 4.0 (H) 0.0 - 2.0 mmol/L   Sodium 124 (L) 135 - 145 mmol/L   Potassium 4.4 3.5 - 5.1 mmol/L   Calcium, Ion 1.13 (L) 1.15 - 1.40 mmol/L   HCT 47.0 39.0 - 52.0 %   Hemoglobin 16.0 13.0 - 17.0 g/dL   Sample type VENOUS    Comment NOTIFIED PHYSICIAN   I-stat chem 8, ED     Status: Abnormal    Collection Time: 07/22/21 12:21 AM  Result Value Ref Range   Sodium 124 (L) 135 - 145 mmol/L   Potassium 4.4 3.5 - 5.1 mmol/L   Chloride 92 (L) 98 - 111 mmol/L   BUN 41 (H) 6 - 20 mg/dL   Creatinine, Ser 2.30 (H) 0.61 - 1.24 mg/dL   Glucose, Bld 459 (H) 70 - 99 mg/dL    Comment: Glucose reference range applies only to samples taken after fasting for at least 8 hours.   Calcium, Ion 1.09 (L) 1.15 - 1.40 mmol/L   TCO2 21 (L) 22 - 32 mmol/L   Hemoglobin 16.3 13.0 - 17.0 g/dL   HCT 48.0 39.0 - 99.8 %  Basic metabolic panel     Status: Abnormal   Collection Time: 07/22/21 12:59 AM  Result Value Ref Range   Sodium 121 (L) 135 - 145 mmol/L   Potassium 4.0 3.5 - 5.1 mmol/L   Chloride 90 (L) 98 - 111 mmol/L   CO2 16 (L) 22 - 32 mmol/L   Glucose, Bld 453 (H) 70 - 99 mg/dL    Comment: Glucose reference range applies only to samples taken after fasting for at least 8 hours.   BUN 42 (H) 6 - 20 mg/dL   Creatinine, Ser 2.32 (H) 0.61 - 1.24 mg/dL   Calcium 7.9 (L) 8.9 - 10.3 mg/dL   GFR, Estimated 32 (L) >60 mL/min    Comment: (NOTE) Calculated using the CKD-EPI Creatinine Equation (2021)    Anion gap 15 5 - 15    Comment: Performed at [facility] [address], [place], [place] [address]  Beta-hydroxybutyric acid     Status: Abnormal   Collection Time: 07/22/21 12:59 AM  Result Value Ref Range   Beta-Hydroxybutyric Acid 2.76 (H) 0.05 - 0.27 mmol/L    Comment: Performed at [facility] [address] [place], [place] [address]  CBG monitoring, ED     Status: Abnormal   Collection Time: 07/22/21  2:14 AM  Result Value Ref Range   Glucose-Capillary 458 (H) 70 - 99 mg/dL    Comment: Glucose reference range applies only to samples taken after fasting for at least 8 hours.  Troponin I (High Sensitivity)     Status: Abnormal   Collection Time: 07/22/21  2:46 AM  Result Value Ref Range   Troponin I (High Sensitivity) 52 (H) <18  ng/L    Comment: (NOTE) Elevated high  sensitivity troponin I (hsTnI) values and significant  changes across serial measurements may suggest ACS but many other  chronic and acute conditions are known to elevate hsTnI results.  Refer to the "Links" section for chest pain algorithms and additional  guidance. Performed at [facility], [place] [address] [place], [place] [address]   Urinalysis, Routine w reflex microscopic Urine, Clean Catch     Status: Abnormal   Collection Time: 07/22/21  2:50 AM  Result Value Ref Range   Color, Urine YELLOW YELLOW   APPearance CLOUDY (A) CLEAR   Specific Gravity, Urine 1.018 1.005 - 1.030   pH 5.0 5.0 - 8.0   Glucose, UA >=500 (A) NEGATIVE mg/dL   Hgb urine dipstick SMALL (A) NEGATIVE   Bilirubin Urine NEGATIVE NEGATIVE   Ketones, ur 5 (A) NEGATIVE mg/dL   Protein, ur 30 (A) NEGATIVE mg/dL   Nitrite NEGATIVE NEGATIVE   Leukocytes,Ua TRACE (A) NEGATIVE   RBC / HPF 0-5 0 - 5 RBC/hpf   WBC, UA 6-10 0 - 5 WBC/hpf   Bacteria, UA FEW (A) NONE SEEN   Squamous Epithelial / LPF 0-5 0 - 5   Mucus PRESENT    Hyaline Casts, UA PRESENT    Granular Casts, UA PRESENT     Comment: Performed at [facility], [address], [place], [place] [address]  CBG monitoring, ED     Status: Abnormal   Collection Time: 07/22/21  2:58 AM  Result Value Ref Range   Glucose-Capillary 460 (H) 70 - 99 mg/dL    Comment: Glucose reference range applies only to samples taken after fasting for at least 8 hours.  Lactic acid, plasma     Status: Abnormal   Collection Time: 07/22/21  3:00 AM  Result Value Ref Range   Lactic Acid, Venous 3.2 (HH) 0.5 - 1.9 mmol/L    Comment: CRITICAL VALUE NOTED.  VALUE IS CONSISTENT WITH PREVIOUSLY REPORTED AND CALLED VALUE. Performed at [facility], [place] [address] [place], [place] [address]   CBG monitoring, ED     Status: Abnormal   Collection Time: 07/22/21  3:37 AM  Result Value Ref Range   Glucose-Capillary 415 (H) 70 - 99 mg/dL    Comment: Glucose reference  range applies only to samples taken after fasting for at least 8 hours.  Basic metabolic panel     Status: Abnormal   Collection Time: 07/22/21  3:50 AM  Result Value Ref Range   Sodium 125 (L) 135 - 145 mmol/L   Potassium 4.1 3.5 - 5.1 mmol/L   Chloride 93 (L) 98 - 111 mmol/L   CO2 17 (L) 22 - 32 mmol/L   Glucose, Bld 390 (H) 70 - 99 mg/dL    Comment: Glucose reference range applies only to samples taken after fasting for at least 8 hours.   BUN 43 (H) 6 - 20 mg/dL   Creatinine, Ser 2.32 (H) 0.61 - 1.24 mg/dL   Calcium 8.0 (L) 8.9 - 10.3 mg/dL   GFR, Estimated 32 (L) >60 mL/min    Comment: (NOTE) Calculated using the CKD-EPI Creatinine Equation (2021)    Anion gap 15 5 - 15    Comment: Performed at [facility] [address], [place],  [address]  HIV Antibody (routine testing w rflx)     Status: None   Collection Time: 07/22/21  3:50 AM  Result Value Ref Range  HIV Screen 4th Generation wRfx Non Reactive Non Reactive    Comment: Performed at [facility], [place] [address], [place], [place] [address]  Protime-INR     Status: Abnormal   Collection Time: 07/22/21  3:50 AM  Result Value Ref Range   Prothrombin Time 16.5 (H) 11.4 - 15.2 seconds   INR 1.3 (H) 0.8 - 1.2    Comment: (NOTE) INR goal varies based on device and disease states. Performed at [facility], [place] [address], [place], [place] [address]   Cortisol-am, blood     Status: Abnormal   Collection Time: 07/22/21  3:50 AM  Result Value Ref Range   Cortisol - AM 27.8 (H) 6.7 - 22.6 ug/dL    Comment: Performed at [facility] [address], [place], [place] [address]  Procalcitonin     Status: None   Collection Time: 07/22/21  3:50 AM  Result Value Ref Range   Procalcitonin 21.90 ng/mL    Comment:        Interpretation: PCT >= 10 ng/mL: Important systemic inflammatory response, almost exclusively due to severe bacterial sepsis or septic shock. (NOTE)       Sepsis PCT Algorithm            Lower Respiratory Tract                                      Infection PCT Algorithm    ----------------------------     ----------------------------         PCT < 0.25 ng/mL                PCT < 0.10 ng/mL          Strongly encourage             Strongly discourage   discontinuation of antibiotics    initiation of antibiotics    ----------------------------     -----------------------------       PCT 0.25 - 0.50 ng/mL            PCT 0.10 - 0.25 ng/mL               OR       >80% decrease in PCT            Discourage initiation of                                            antibiotics      Encourage discontinuation           of antibiotics    ----------------------------     -----------------------------         PCT >= 0.50 ng/mL              PCT 0.26 - 0.50 ng/mL                AND       <80% decrease in PCT             Encourage initiation of  antibiotics       Encourage continuation           of antibiotics    ----------------------------     -----------------------------        PCT >= 0.50 ng/mL                  PCT > 0.50 ng/mL               AND         increase in PCT                  Strongly encourage                                      initiation of antibiotics    Strongly encourage escalation           of antibiotics                                     -----------------------------                                           PCT <= 0.25 ng/mL                                                 OR                                        > 80% decrease in PCT                                      Discontinue / Do not initiate                                             antibiotics  Performed at [facility], [address], [place], [place] [address]   CBC     Status: Abnormal   Collection Time: 07/22/21  3:50 AM  Result Value Ref Range   WBC 11.0 (H) 4.0 - 10.5 K/uL   RBC 5.43 4.22 - 5.81 MIL/uL   Hemoglobin 15.7  13.0 - 17.0 g/dL    Comment: REPEATED TO VERIFY   HCT 46.3 39.0 - 52.0 %   MCV 85.3 80.0 - 100.0 fL   MCH 28.9 26.0 - 34.0 pg   MCHC 33.9 30.0 - 36.0 g/dL   RDW 14.7 11.5 - 15.5 %   Platelets 108 (L) 150 - 400 K/uL    Comment: Immature Platelet Fraction may be clinically indicated, consider ordering this additional test [id number] REPEATED TO VERIFY PLATELET COUNT CONFIRMED BY SMEAR    nRBC 0.2 0.0 - 0.2 %    Comment: Performed at [facility], [place] [address] [place], [place] [address]  CBG monitoring,  ED     Status: Abnormal   Collection Time: 07/22/21  4:29 AM  Result Value Ref Range   Glucose-Capillary 415 (H) 70 - 99 mg/dL    Comment: Glucose reference range applies only to samples taken after fasting for at least 8 hours.  CBG monitoring, ED     Status: Abnormal   Collection Time: 07/22/21  5:06 AM  Result Value Ref Range   Glucose-Capillary 372 (H) 70 - 99 mg/dL    Comment: Glucose reference range applies only to samples taken after fasting for at least 8 hours.  CBG monitoring, ED     Status: Abnormal   Collection Time: 07/22/21  6:10 AM  Result Value Ref Range   Glucose-Capillary 296 (H) 70 - 99 mg/dL    Comment: Glucose reference range applies only to samples taken after fasting for at least 8 hours.  Lactic acid, plasma     Status: Abnormal   Collection Time: 07/22/21  6:45 AM  Result Value Ref Range   Lactic Acid, Venous 3.0 (HH) 0.5 - 1.9 mmol/L    Comment: CRITICAL VALUE NOTED.  VALUE IS CONSISTENT WITH PREVIOUSLY REPORTED AND CALLED VALUE. Performed at [facility], [place] [address], [place], [place] [address]   Troponin I (High Sensitivity)     Status: Abnormal   Collection Time: 07/22/21  6:45 AM  Result Value Ref Range   Troponin I (High Sensitivity) 44 (H) <18 ng/L    Comment: (NOTE) Elevated high sensitivity troponin I (hsTnI) values and significant  changes across serial measurements may suggest ACS but many other  chronic and acute  conditions are known to elevate hsTnI results.  Refer to the "Links" section for chest pain algorithms and additional  guidance. Performed at [facility], [place] [address], [place], [place] [address]   CBG monitoring, ED     Status: Abnormal   Collection Time: 07/22/21  7:24 AM  Result Value Ref Range   Glucose-Capillary 231 (H) 70 - 99 mg/dL    Comment: Glucose reference range applies only to samples taken after fasting for at least 8 hours.  CBG monitoring, ED     Status: Abnormal   Collection Time: 07/22/21  8:30 AM  Result Value Ref Range   Glucose-Capillary 208 (H) 70 - 99 mg/dL    Comment: Glucose reference range applies only to samples taken after fasting for at least 8 hours.  Heparin level (unfractionated)     Status: Abnormal   Collection Time: 07/22/21  9:20 AM  Result Value Ref Range   Heparin Unfractionated <0.10 (L) 0.30 - 0.70 IU/mL    Comment: (NOTE) The clinical reportable range upper limit is being lowered to >1.10 to align with the FDA approved guidance for the current laboratory assay.  If heparin results are below expected values, and patient dosage has  been confirmed, suggest follow up testing of antithrombin III levels. Performed at [facility], [place] [address] [place], [place] [address]   Basic metabolic panel     Status: Abnormal   Collection Time: 07/22/21  9:20 AM  Result Value Ref Range   Sodium 126 (L) 135 - 145 mmol/L   Potassium 3.9 3.5 - 5.1 mmol/L   Chloride 91 (L) 98 - 111 mmol/L   CO2 23 22 - 32 mmol/L   Glucose, Bld 180 (H) 70 - 99 mg/dL    Comment: Glucose reference range applies only to samples taken after fasting for at least 8 hours.  BUN 45 (H) 6 - 20 mg/dL   Creatinine, Ser 2.56 (H) 0.61 - 1.24 mg/dL   Calcium 8.0 (L) 8.9 - 10.3 mg/dL   GFR, Estimated 29 (L) >60 mL/min    Comment: (NOTE) Calculated using the CKD-EPI Creatinine Equation (2021)    Anion gap 12 5 - 15    Comment: Performed at [facility] [address] [place], [place] [address]  Beta-hydroxybutyric acid     Status: None   Collection Time: 07/22/21  9:20 AM  Result Value Ref Range   Beta-Hydroxybutyric Acid 0.13 0.05 - 0.27 mmol/L    Comment: Performed at [facility] [address], [place], [place] [address]  CBG monitoring, ED     Status: Abnormal   Collection Time: 07/22/21  9:37 AM  Result Value Ref Range   Glucose-Capillary 187 (H) 70 - 99 mg/dL    Comment: Glucose reference range applies only to samples taken after fasting for at least 8 hours.  CBG monitoring, ED     Status: Abnormal   Collection Time: 07/22/21 10:36 AM  Result Value Ref Range   Glucose-Capillary 181 (H) 70 - 99 mg/dL    Comment: Glucose reference range applies only to samples taken after fasting for at least 8 hours.  CBG monitoring, ED     Status: Abnormal   Collection Time: 07/22/21 11:43 AM  Result Value Ref Range   Glucose-Capillary 175 (H) 70 - 99 mg/dL    Comment: Glucose reference range applies only to samples taken after fasting for at least 8 hours.  CBG monitoring, ED     Status: Abnormal   Collection Time: 07/22/21 12:45 PM  Result Value Ref Range   Glucose-Capillary 164 (H) 70 - 99 mg/dL    Comment: Glucose reference range applies only to samples taken after fasting for at least 8 hours.  Lactic acid, plasma     Status: Abnormal   Collection Time: 07/22/21 12:50 PM  Result Value Ref Range   Lactic Acid, Venous 3.1 (HH) 0.5 - 1.9 mmol/L    Comment: CRITICAL VALUE NOTED.  VALUE IS CONSISTENT WITH PREVIOUSLY REPORTED AND CALLED VALUE. Performed at [facility], [place] [address], [place],  [address]   CBG monitoring, ED     Status: Abnormal   Collection Time: 07/22/21  1:46 PM  Result Value Ref Range   Glucose-Capillary 135 (H) 70 - 99 mg/dL    Comment: Glucose reference range applies only to samples taken after fasting for at least 8 hours.  CBG monitoring, ED     Status: Abnormal   Collection Time: 07/22/21   2:52 PM  Result Value Ref Range   Glucose-Capillary 130 (H) 70 - 99 mg/dL    Comment: Glucose reference range applies only to samples taken after fasting for at least 8 hours.   DG Chest 1 View  Result Date: 07/22/2021 EXAM: EXAM CHEST  1 VIEW COMPARISON:  Portable AP chest today at 8:08 p.m. FINDINGS: Provided are 2 separate lateral chest radiographs on this patient. There is a trace left pleural effusion but there is no convincing overlying infiltrate. Exaggerated thoracic kyphosis is noted with multilevel thoracic spine degenerative discs. IMPRESSION: Trace left pleural effusion visible on the lateral view, but no definitive adjacent infiltrate. Electronically Signed   By: Telford Nab M.D.   On: 07/22/2021 00:52   CT Foot Left Wo Contrast  Result Date: 07/22/2021 CLINICAL DATA:  Left foot infection EXAM: CT OF THE  LEFT FOOT WITHOUT CONTRAST TECHNIQUE: Multidetector CT imaging of the left foot was performed according to the standard protocol. Multiplanar CT image reconstructions were also generated. COMPARISON:  None. FINDINGS: Bones/Joint/Cartilage Transmetatarsal amputation of the first and second digits of the left foot are noted. Intra osseous gas and cortical erosion is seen involving the base of the residual proximal phalanx, metatarsal head, and sesamoid bones of the great toe in keeping with changes of osteomyelitis. Superimposed advanced degenerative arthritis of the first MTP joint. Remaining joint spaces are preserved. No acute fracture or dislocation. No additional sites of osseous erosion identified. Ligaments Suboptimally assessed by CT. Muscles and Tendons Unremarkable Soft tissues There is extensive soft tissue swelling involving the residual great toe with extensive subcutaneous gas surrounding the first MTP joint and extending medially and laterally into the plantar soft tissues subjacent to the second metatarsal. Moderate subcutaneous edema of the left foot and ankle. No discrete  drainable subcutaneous fluid collection identified. Plantar ulcer noted within the residual great toe. IMPRESSION: Extensive subcutaneous gas in keeping with aggressive soft tissue infection and erosive changes surrounding the first MTP joint in keeping with septic arthritis/osteomyelitis in this location. No discrete drainable fluid collection identified. Electronically Signed   By: Fidela Salisbury M.D.   On: 07/22/2021 02:33   DG Chest Port 1 View  Result Date: 07/21/2021 CLINICAL DATA:  55 year old male with pain. EXAM: PORTABLE CHEST 1 VIEW COMPARISON:  None. FINDINGS: UPPER limits normal heart size noted. Equivocal LEFT LOWER lobe retrocardiac density/atelectasis may be present. There is no other evidence of focal airspace disease, pulmonary edema, suspicious pulmonary nodule/mass, pleural effusion, or pneumothorax. No acute bony abnormalities are identified. IMPRESSION: 1. Equivocal LEFT LOWER lobe retrocardiac density/atelectasis. Recommend LATERAL chest radiograph if there is clinical suspicion for pneumonia. Electronically Signed   By: Margarette Canada M.D.   On: 07/21/2021 20:50   DG Toe Great Left  Result Date: 07/21/2021 CLINICAL DATA:  Left great toe pain, osteomyelitis EXAM: LEFT GREAT TOE COMPARISON:  None. FINDINGS: Three view radiograph left great toe demonstrates resection of the distal phalanx of the second digit and probable trans phalangeal amputation proximal phalanx of the great toe. There is a soft tissue ulcer involving the distal, medial, plantar aspect the residual left greatq toe. There is subcutaneous gas within the a dorsal lateral soft tissues of the left great toe. There are periarticular lucencies within the a distal phalanx and distal aspect of the first metatarsal medially suspicious for changes of osteomyelitis/septic arthritis. Superimposed moderate degenerative arthritis of the first MTP joint. IMPRESSION: Postsurgical changes as described above. Subcutaneous gas within the  dorsal lateral soft tissues at the level of the first MTP joint with subjacent periarticular lucency within the a base of the first proximal phalanx and distal aspect of the first metatarsal suspicious for changes of septic arthritis/osteomyelitis. This could be confirmed with contrast enhanced MRI examination. Electronically Signed   By: Fidela Salisbury M.D.   On: 07/21/2021 20:59    Pending Labs Unresulted Labs (From admission, onward)     Start     Ordered   07/23/21 0500  Heparin level (unfractionated)  Daily,   R     See Hyperspace for full Linked Orders Report.   07/22/21 0051   07/23/21 0500  CBC  Daily,   R     See Hyperspace for full Linked Orders Report.   07/22/21 0051   07/23/21 1610  Basic metabolic panel  Tomorrow morning,   R  07/22/21 1206   07/22/21 2000  Vancomycin, random  Once-Timed,   TIMED        07/22/21 0809   07/22/21 1800  Heparin level (unfractionated)  Once-Timed,   TIMED        07/22/21 1123   07/22/21 1222  Lactic acid, plasma  STAT Now then every 3 hours,   R (with STAT occurrences)      07/22/21 1221   07/22/21 0239  Aerobic/Anaerobic Culture w Gram Stain (surgical/deep wound)  Once,   STAT        07/22/21 0238   07/21/21 1954  Urine Culture  (Septic presentation on arrival (screening labs, nursing and treatment orders for obvious sepsis))  ONCE - STAT,   STAT       Question:  Indication  Answer:  Sepsis   07/21/21 1955            Vitals/Pain Today's Vitals   07/22/21 1450 07/22/21 1455 07/22/21 1500 07/22/21 1505  BP: 111/90 (!) 131/107 103/67 (!) 114/99  Pulse: (!) 135 69 67 71  Resp: (!) 25 (!) 26 (!) 25 (!) 32  Temp:    98.4 F (36.9 C)  TempSrc:    Oral  SpO2: 95% 94% 95% 94%  Weight:      Height:      PainSc:        Isolation Precautions No active isolations  Medications Medications  vancomycin variable dose per unstable renal function (pharmacist dosing) (has no administration in time range)  insulin regular, human  (MYXREDLIN) 100 units/ 100 mL infusion (2.2 Units/hr Intravenous Rate/Dose Change 07/22/21 1501)  dextrose 50 % solution 0-50 mL (has no administration in time range)  potassium chloride 10 mEq in 100 mL IVPB (10 mEq Intravenous Not Given 07/22/21 1305)  clindamycin (CLEOCIN) IVPB 600 mg (0 mg Intravenous Stopped 07/22/21 1047)  piperacillin-tazobactam (ZOSYN) IVPB 3.375 g (3.375 g Intravenous New Bag/Given 07/22/21 1509)  fentaNYL (SUBLIMAZE) injection 12.5-50 mcg (25 mcg Intravenous Given 07/22/21 0328)  acetaminophen (TYLENOL) tablet 650 mg (has no administration in time range)    Or  acetaminophen (TYLENOL) suppository 650 mg (has no administration in time range)  ondansetron (ZOFRAN) tablet 4 mg (has no administration in time range)    Or  ondansetron (ZOFRAN) injection 4 mg (has no administration in time range)  oxyCODONE (Oxy IR/ROXICODONE) immediate release tablet 10 mg (10 mg Oral Given 07/22/21 1502)  tranexamic acid (CYKLOKAPRON) 2,000 mg in sodium chloride 0.9 % 50 mL Topical Application (has no administration in time range)  lactated ringers infusion (0 mLs Intravenous Hold 07/22/21 1215)  insulin aspart (novoLOG) injection 0-5 Units (has no administration in time range)  insulin aspart (novoLOG) injection 0-15 Units (has no administration in time range)  0.9 %  sodium chloride infusion (250 mLs Intravenous Not Given 07/22/21 1352)  norepinephrine (LEVOPHED) 4mg  in 276mL premix infusion (2 mcg/min Intravenous Rate/Dose Change 07/22/21 1457)  hydrocortisone sodium succinate (SOLU-CORTEF) 100 MG injection 100 mg (100 mg Intravenous Given 07/22/21 1417)  lactated ringers bolus 1,000 mL (0 mLs Intravenous Stopped 07/21/21 2148)    And  lactated ringers bolus 1,000 mL (0 mLs Intravenous Stopped 07/21/21 2149)    And  lactated ringers bolus 1,000 mL (0 mLs Intravenous Stopped 07/21/21 2149)    And  lactated ringers bolus 500 mL (0 mLs Intravenous Stopped 07/22/21 0005)  vancomycin  (VANCOREADY) IVPB 2000 mg/400 mL (0 mg Intravenous Stopped 07/22/21 0004)  cefTRIAXone (ROCEPHIN) 2 g in sodium chloride 0.9 %  100 mL IVPB (0 g Intravenous Stopped 07/21/21 2130)  oxyCODONE (Oxy IR/ROXICODONE) immediate release tablet 10 mg (10 mg Oral Given 07/21/21 2258)  lactated ringers bolus 1,000 mL (0 mLs Intravenous Stopped 07/22/21 0455)  heparin bolus via infusion 5,000 Units (5,000 Units Intravenous Bolus from Bag 07/22/21 0109)  piperacillin-tazobactam (ZOSYN) IVPB 3.375 g (0 g Intravenous Stopped 07/22/21 0223)  heparin bolus via infusion 2,000 Units (2,000 Units Intravenous Bolus from Bag 07/22/21 1139)  sodium chloride 0.9 % bolus 1,000 mL (0 mLs Intravenous Stopped 07/22/21 1429)    Mobility walks with device Moderate fall risk   Focused Assessments Cardiac Assessment Handoff:  Cardiac Rhythm: Sinus tachycardia No results found for: CKTOTAL, CKMB, CKMBINDEX, TROPONINI No results found for: DDIMER Does the Patient currently have chest pain? No   , Neuro Assessment Handoff:  Swallow screen pass? Yes  Cardiac Rhythm: Sinus tachycardia       Neuro Assessment: Within Defined Limits Neuro Checks:      Last Documented NIHSS Modified Score:   Has TPA been given? No If patient is a Neuro Trauma and patient is going to OR before floor call report to [facility] nurse: [phone] or [phone]  , Pulmonary Assessment Handoff:  Lung sounds:   O2 Device: Nasal Cannula O2 Flow Rate (L/min): 4 L/min   , see musculoskeletal and peripheral vascular assessment    R Recommendations: See Admitting Provider Note  Report given to:   Additional Notes: Pt has been taking unprescribed steroids since 2016. Reports taking testosterone, trenobolone, and cypionate.

## 2021-07-22 NOTE — ED Notes (Addendum)
Dr. Broadus [name] aware of BP in th 80s. She also states, "anion gap has corrected, lets give long acting Insulin and then DC Insulin drip in 2 hours, and change IVF to LR at 161ml/hr."

## 2021-07-22 NOTE — ED Notes (Signed)
Dr. Tacy Learn aware that endotool is suggesting transition orders at this time. MD states that he would like to continue pt on endotool at this time.

## 2021-07-22 NOTE — Progress Notes (Signed)
Pharmacy Antibiotic Note [name] is a 55 y.o. male admitted on 07/21/2021 with  cellulitis , possible necrotizing fasciitis. On presentation, WBC 13.0 and Scr 2.76 in AKI. In ED, given ceftriaxone 2g x 1. Pharmacy has been consulted for vancomycin and Zosyn dosing x 10 days. Pt also on clindamycin for possible necrotizing fasciitis.  Plan: Zosyn 3.375gm IV q8h F/u SCr trend for further Vancomycin dosing  Temp (24hrs), Avg:97.5 F (36.4 C), Min:97.5 F (36.4 C), Max:97.5 F (36.4 C)  Recent Labs  Lab 07/21/21 2003 07/21/21 2257 07/22/21 0021  WBC 13.0*  --   --   CREATININE 2.76*  --  2.30*  LATICACIDVEN 4.0* 2.9*  --      Estimated Creatinine Clearance: 48.4 mL/min (A) (by C-G formula based on SCr of 2.3 mg/dL (H)).     Antimicrobials this admission: 11/14 ceftriaxone x1 11/14 vancomycin >> 11/15 zosyn >> 11/15 clindamycin >>  Microbiology results: 11/14 BCx: sent 11/14 UCx: sent  Thank you for allowing pharmacy to participate in this patient's care.  Sherlon Handing, PharmD, BCPS Please see amion for complete clinical pharmacist phone list 07/22/2021,1:02 AM

## 2021-07-23 ENCOUNTER — Inpatient Hospital Stay (HOSPITAL_COMMUNITY): Payer: Medicare HMO

## 2021-07-23 ENCOUNTER — Encounter (HOSPITAL_COMMUNITY): Payer: Self-pay | Admitting: Internal Medicine

## 2021-07-23 ENCOUNTER — Inpatient Hospital Stay (HOSPITAL_COMMUNITY): Payer: Medicare HMO | Admitting: General Practice

## 2021-07-23 ENCOUNTER — Encounter (HOSPITAL_COMMUNITY)
Admission: EM | Disposition: A | Discharge status: Skilled Nursing Facility | Payer: Self-pay | Source: Home / Self Care | Attending: Internal Medicine

## 2021-07-23 DIAGNOSIS — E1152 Type 2 diabetes mellitus with diabetic peripheral angiopathy with gangrene: Secondary | ICD-10-CM | POA: Diagnosis not present

## 2021-07-23 DIAGNOSIS — M4807 Spinal stenosis, lumbosacral region: Secondary | ICD-10-CM | POA: Diagnosis not present

## 2021-07-23 DIAGNOSIS — A480 Gas gangrene: Secondary | ICD-10-CM | POA: Diagnosis not present

## 2021-07-23 DIAGNOSIS — R7881 Bacteremia: Secondary | ICD-10-CM | POA: Diagnosis not present

## 2021-07-23 DIAGNOSIS — E11628 Type 2 diabetes mellitus with other skin complications: Secondary | ICD-10-CM | POA: Diagnosis not present

## 2021-07-23 DIAGNOSIS — R6520 Severe sepsis without septic shock: Secondary | ICD-10-CM | POA: Diagnosis not present

## 2021-07-23 DIAGNOSIS — E1165 Type 2 diabetes mellitus with hyperglycemia: Secondary | ICD-10-CM | POA: Diagnosis not present

## 2021-07-23 DIAGNOSIS — M546 Pain in thoracic spine: Secondary | ICD-10-CM | POA: Diagnosis not present

## 2021-07-23 DIAGNOSIS — L089 Local infection of the skin and subcutaneous tissue, unspecified: Secondary | ICD-10-CM | POA: Diagnosis not present

## 2021-07-23 DIAGNOSIS — Z794 Long term (current) use of insulin: Secondary | ICD-10-CM | POA: Diagnosis not present

## 2021-07-23 DIAGNOSIS — A419 Sepsis, unspecified organism: Secondary | ICD-10-CM | POA: Diagnosis not present

## 2021-07-23 DIAGNOSIS — G8918 Other acute postprocedural pain: Secondary | ICD-10-CM | POA: Diagnosis not present

## 2021-07-23 HISTORY — PX: AMPUTATION: SHX166

## 2021-07-23 LAB — CBC
HCT: 41.9 % (ref 39.0–52.0)
HCT: 43 % (ref 39.0–52.0)
Hemoglobin: 14.5 g/dL (ref 13.0–17.0)
Hemoglobin: 14.3 g/dL (ref 13.0–17.0)
MCH: 28.2 pg (ref 26.0–34.0)
MCH: 27.7 pg (ref 26.0–34.0)
MCHC: 34.6 g/dL (ref 30.0–36.0)
MCHC: 33.3 g/dL (ref 30.0–36.0)
MCV: 81.5 fL (ref 80.0–100.0)
MCV: 83.3 fL (ref 80.0–100.0)
Platelets: 87 10*3/uL — ABNORMAL LOW (ref 150–400)
Platelets: 83 10*3/uL — ABNORMAL LOW (ref 150–400)
RBC: 5.14 MIL/uL (ref 4.22–5.81)
RBC: 5.16 MIL/uL (ref 4.22–5.81)
RDW: 14.6 % (ref 11.5–15.5)
RDW: 15.3 % (ref 11.5–15.5)
WBC: 10.8 10*3/uL — ABNORMAL HIGH (ref 4.0–10.5)
WBC: 13.5 10*3/uL — ABNORMAL HIGH (ref 4.0–10.5)
nRBC: 0 % (ref 0.0–0.2)
nRBC: 0 % (ref 0.0–0.2)

## 2021-07-23 LAB — BASIC METABOLIC PANEL
Anion gap: 11 (ref 5–15)
BUN: 50 mg/dL — ABNORMAL HIGH (ref 6–20)
CO2: 18 mmol/L — ABNORMAL LOW (ref 22–32)
Calcium: 7.9 mg/dL — ABNORMAL LOW (ref 8.9–10.3)
Chloride: 99 mmol/L (ref 98–111)
Creatinine, Ser: 2.49 mg/dL — ABNORMAL HIGH (ref 0.61–1.24)
GFR, Estimated: 30 mL/min — ABNORMAL LOW (ref >60)
Glucose, Bld: 131 mg/dL — ABNORMAL HIGH (ref 70–99)
Potassium: 3.6 mmol/L (ref 3.5–5.1)
Sodium: 128 mmol/L — ABNORMAL LOW (ref 135–145)

## 2021-07-23 LAB — GLUCOSE, CAPILLARY
Glucose-Capillary: 111 mg/dL — ABNORMAL HIGH (ref 70–99)
Glucose-Capillary: 136 mg/dL — ABNORMAL HIGH (ref 70–99)
Glucose-Capillary: 152 mg/dL — ABNORMAL HIGH (ref 70–99)
Glucose-Capillary: 175 mg/dL — ABNORMAL HIGH (ref 70–99)
Glucose-Capillary: 178 mg/dL — ABNORMAL HIGH (ref 70–99)
Glucose-Capillary: 187 mg/dL — ABNORMAL HIGH (ref 70–99)
Glucose-Capillary: 188 mg/dL — ABNORMAL HIGH (ref 70–99)
Glucose-Capillary: 361 mg/dL — ABNORMAL HIGH (ref 70–99)
Glucose-Capillary: 368 mg/dL — ABNORMAL HIGH (ref 70–99)
Glucose-Capillary: 421 mg/dL — ABNORMAL HIGH (ref 70–99)

## 2021-07-23 LAB — URINE CULTURE: Culture: 30000 — AB

## 2021-07-23 LAB — OSMOLALITY: Osmolality: 308 mOsm/kg — ABNORMAL HIGH (ref 275–295)

## 2021-07-23 LAB — ECHOCARDIOGRAM LIMITED
Height: 75 in
S' Lateral: 4.4 cm
Weight: 3839.99 oz

## 2021-07-23 LAB — OSMOLALITY, URINE: Osmolality, Ur: 511 mOsm/kg (ref 300–900)

## 2021-07-23 IMAGING — MR MR THORACIC SPINE W/O CM
6 of 9 series · 21 of 48 positions shown · non-contrast
Comparison: Lumbar spine MRI [DATE]

Chest CT [DATE]

CLINICAL DATA: Back pain, sepsis concern for epidural abscess.

EXAM:
MRI THORACIC AND LUMBAR SPINE WITHOUT CONTRAST
TECHNIQUE: Multiplanar and multiecho pulse sequences of the thoracic and lumbar
spine were obtained without intravenous contrast.

[Series 19: T2 · sagittal · 3.0mm · 0.88mm/px · 4 of 22 slices shown (1 of 2)]
[im 1/22]
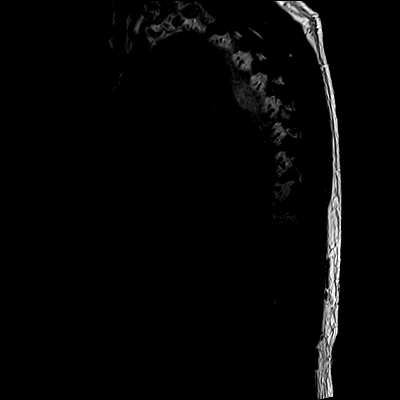
[im 8/22]
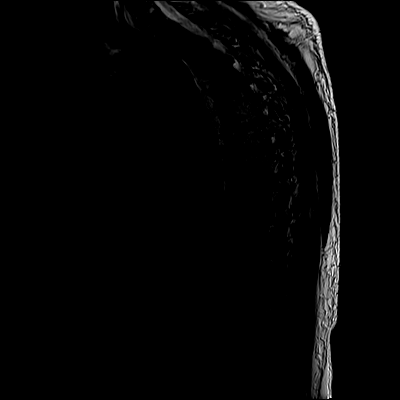
[im 15/22]
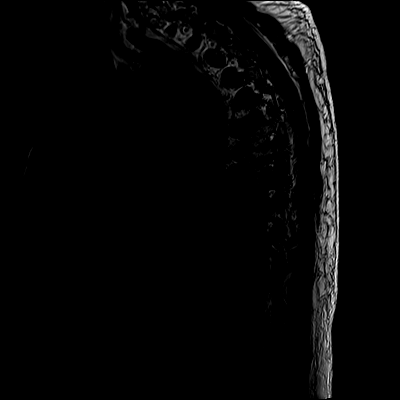
[im 22/22]
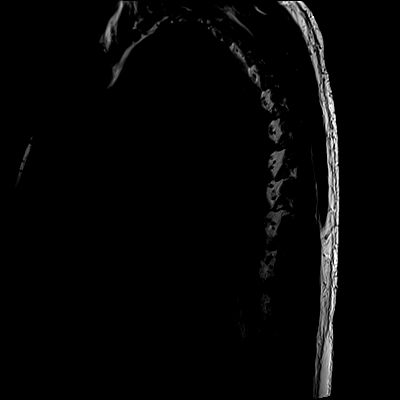

[Series 20: T1 · sagittal · 3.0mm · 0.99mm/px · 3 of 22 slices shown (1 of 2)]
[im 1/22]
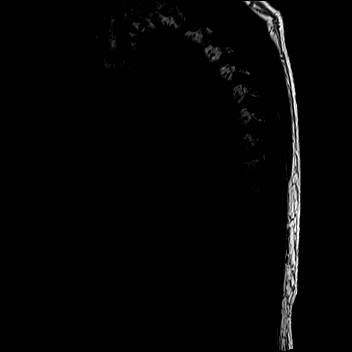
[im 11/22]
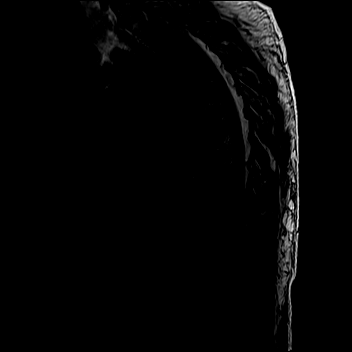
[im 22/22]
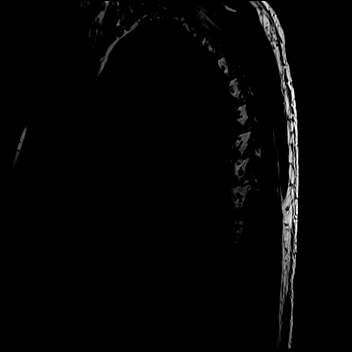

[Series 21: STIR · sagittal · 3.0mm · 0.50mm/px · 3 of 22 slices shown (1 of 2)]
[im 1/22]
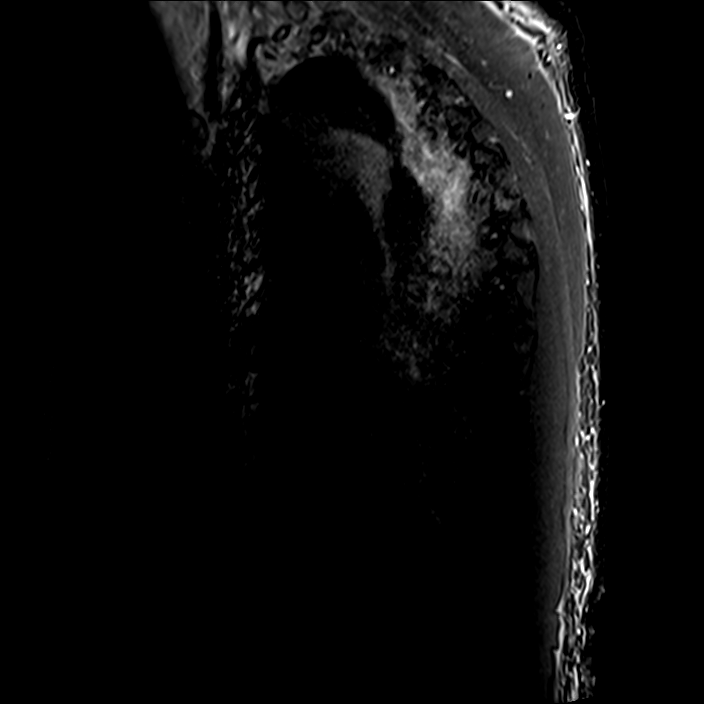
[im 11/22]
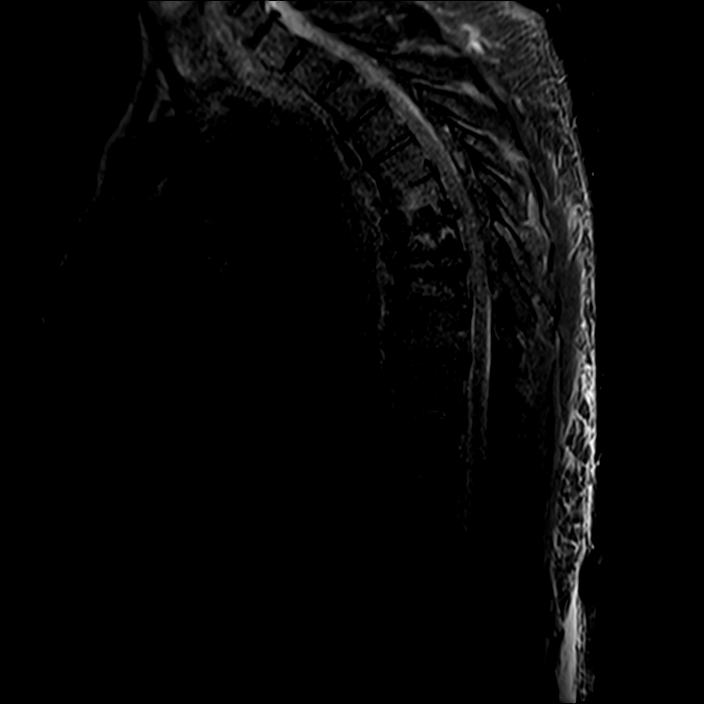
[im 22/22]
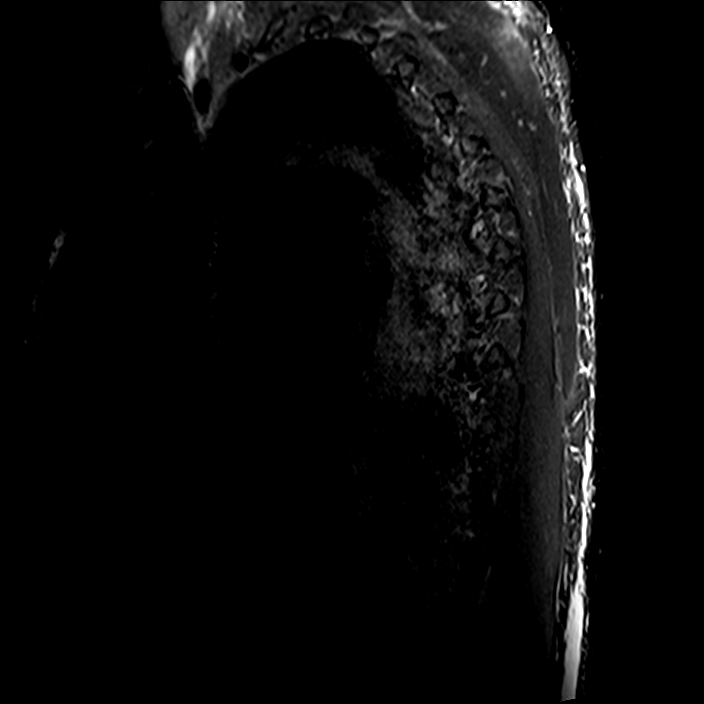

[Series 22: T1 · sagittal · 3.0mm · 0.99mm/px · 3 of 22 slices shown (2 of 2)]
[im 1/22]
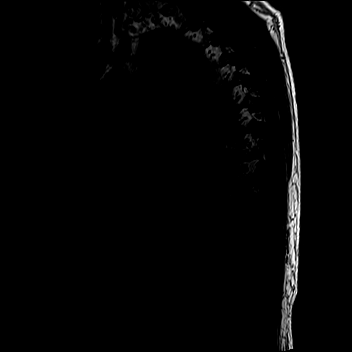
[im 11/22]
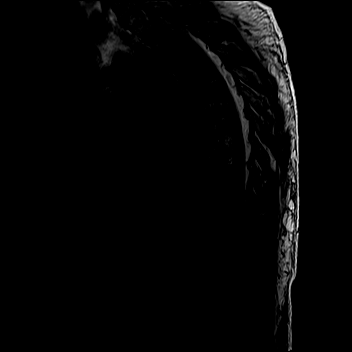
[im 22/22]
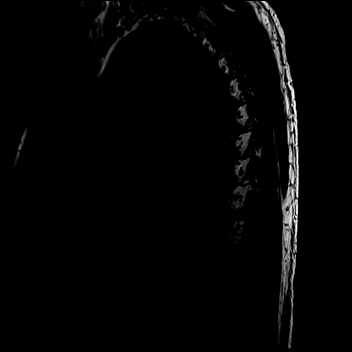

[Series 23: STIR · sagittal · 3.0mm · 0.50mm/px · 2 of 22 slices shown (2 of 2)]
[im 1/22]
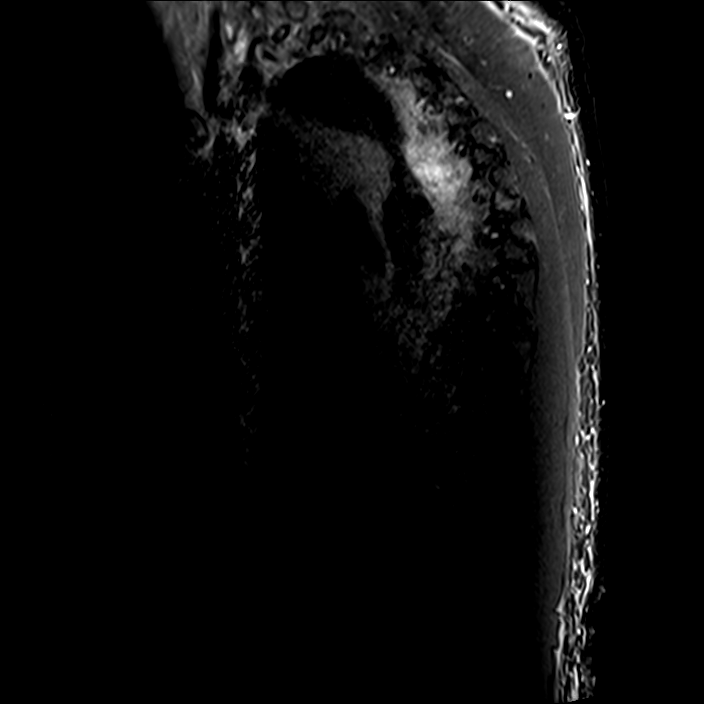
[im 11/22]
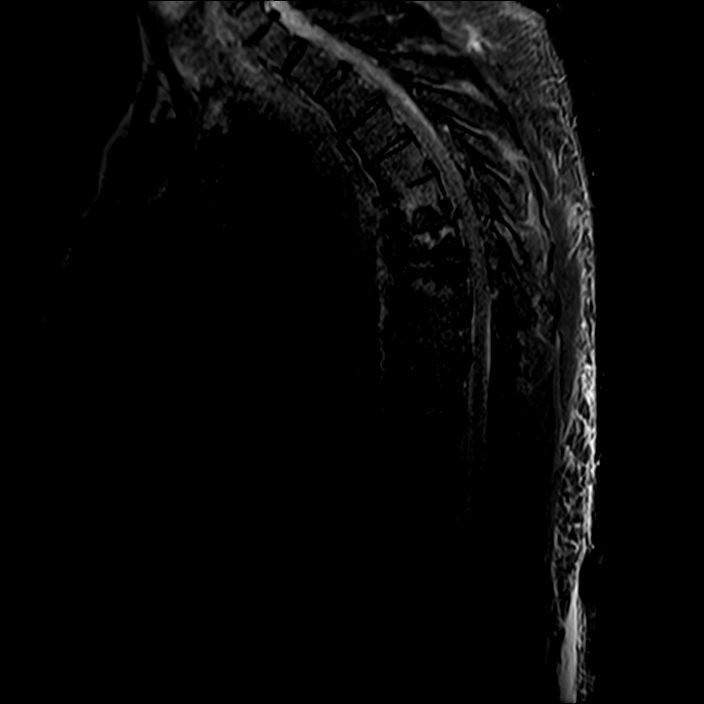

[Series 24: T2 · axial · 4.0mm · 0.59mm/px · z∈[-250,-52]mm · 6 of 39 slices shown (2 of 2)]
[im 1/39]
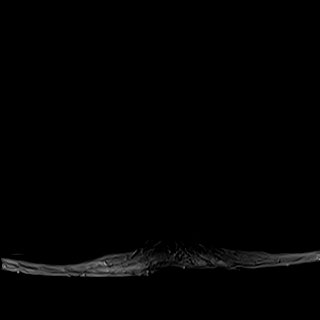
[im 8/39]
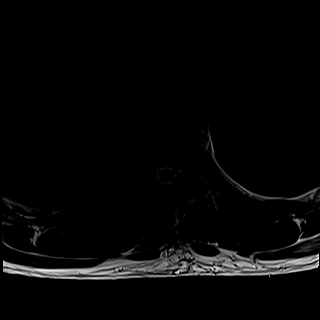
[im 16/39]
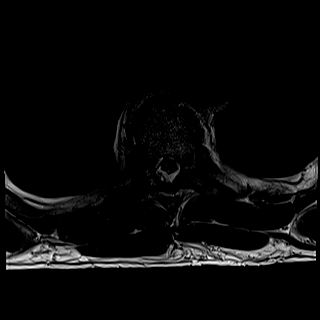
[im 23/39]
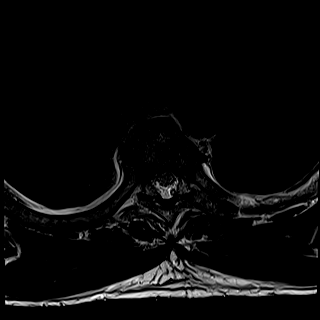
[im 31/39]
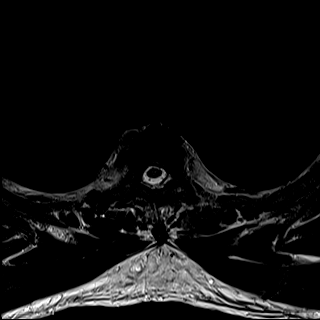
[im 39/39]
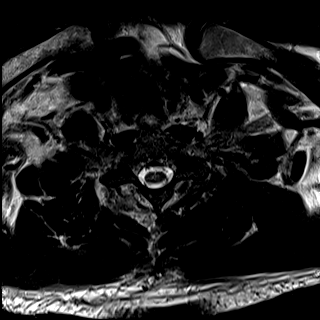

[21 of 48 positions shown; findings below may reference images not displayed]

FINDINGS: MRI THORACIC SPINE FINDINGS

Alignment:  Physiologic.

Vertebrae: There is moderate bone marrow edema at T6, T7 and T8.
There is edema within the T7-8 disc space.

Cord:  Normal signal and morphology.  No epidural collection.

Paraspinal and other soft tissues: Focal area of opacity at the
posterior right hemithorax near the area of abnormal midthoracic
bone marrow signal (series 24, image 15).

Disc levels:

T4-5: Small right subarticular disc protrusion without stenosis.

T6-7: Epidural lipomatosis effacing the CSF space.

T7-8: Epidural lipomatosis effacing the CSF space.

Other disc levels are unremarkable.

MRI LUMBAR SPINE FINDINGS

Segmentation:  Standard.

Alignment:  Physiologic.

Vertebrae:  No fracture, evidence of discitis, or bone lesion.

Conus medullaris and cauda equina: Conus extends to the L1 level.
Conus and cauda equina appear normal.

Paraspinal and other soft tissues: Negative.

Disc levels:

L1-L2: Normal disc space and facet joints. No spinal canal stenosis.
No neural foraminal stenosis.

L2-L3: Normal disc space and facet joints. No spinal canal stenosis.
No neural foraminal stenosis.

L3-L4: Normal disc space and facet joints. No spinal canal stenosis.
No neural foraminal stenosis.

L4-L5: Small central disc protrusion with mild facet hypertrophy,
unchanged. No spinal canal stenosis. No neural foraminal stenosis.

L5-S1: Unchanged small central disc protrusion with disc height
loss. No spinal canal stenosis. Unchanged severe right and moderate
left neural foraminal stenosis.

Visualized sacrum: Normal.
IMPRESSION: 1. Moderate bone marrow edema at T6, T7 and T8 with edema of the
T7-8 disc space. These findings could be degenerative or indicative
of discitis-osteomyelitis. No epidural abscess.
2. Area of abnormality in the posterior right lung, near the
abnormal thoracic vertebral levels. This is poorly visualized by MRI
and correlation with chest CT might be helpful.
3. Unchanged severe right and moderate left neural foraminal
stenosis at L5-S1.
4. Epidural lipomatosis at T6-7 and T7-8 effaces the CSF space.

## 2021-07-23 IMAGING — MR MR LUMBAR SPINE W/O CM
5 of 6 series · 34 of 48 positions shown · non-contrast
Comparison: Lumbar spine MRI [DATE]

Chest CT [DATE]

CLINICAL DATA: Back pain, sepsis concern for epidural abscess.

EXAM:
MRI THORACIC AND LUMBAR SPINE WITHOUT CONTRAST
TECHNIQUE: Multiplanar and multiecho pulse sequences of the thoracic and lumbar
spine were obtained without intravenous contrast.

[Series 1: T2 · sagittal · 4.0mm · 0.73mm/px · 5 of 17 slices shown (1 of 3)]
[im 1/17]
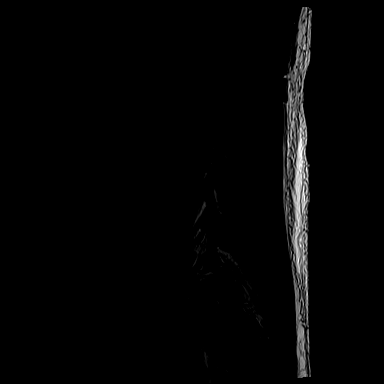
[im 5/17]
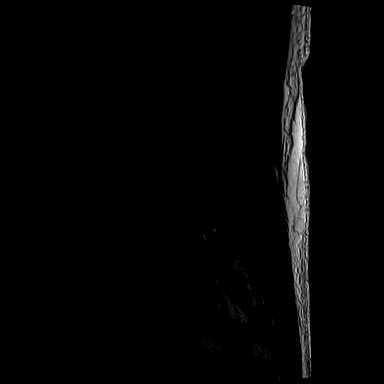
[im 9/17]
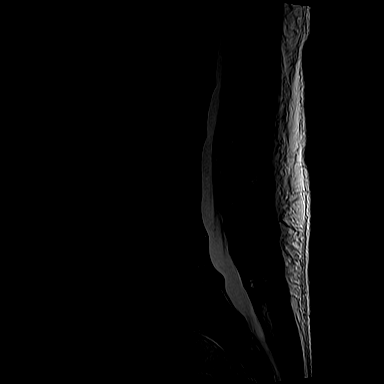
[im 13/17]
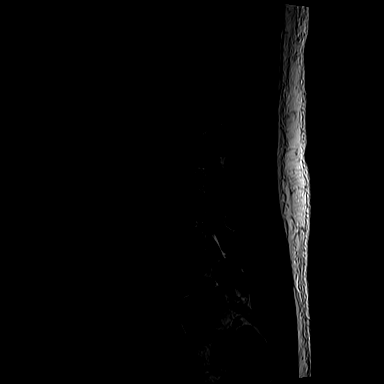
[im 17/17]
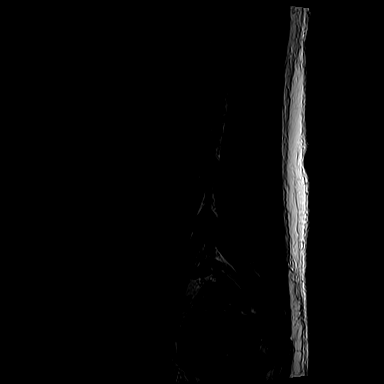

[Series 3: T1 · sagittal · 4.0mm · 0.88mm/px · 5 of 17 slices shown (1 of 2)]
[im 1/17]
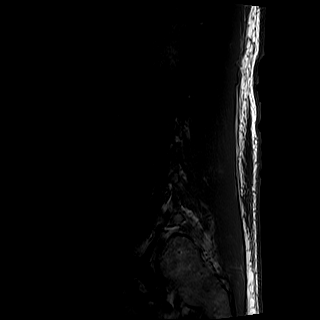
[im 5/17]
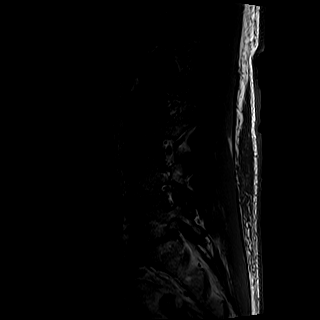
[im 9/17]
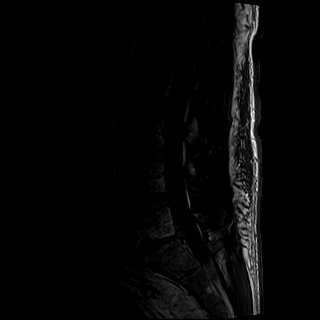
[im 13/17]
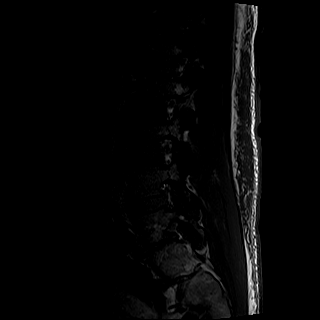
[im 17/17]
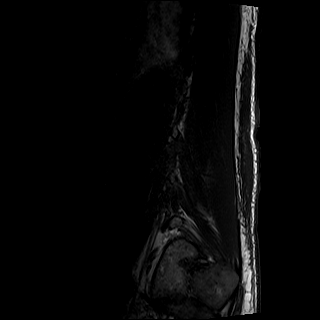

[Series 4: T2 · axial · 5.0mm · 0.62mm/px · z∈[-497,-240]mm · 11 of 37 slices shown (2 of 3)]
[im 1/37]
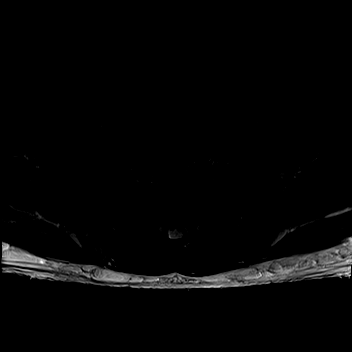
[im 4/37]
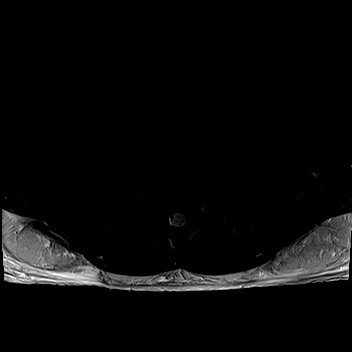
[im 8/37]
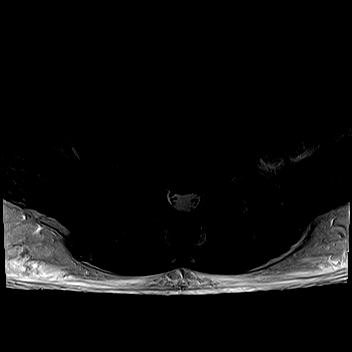
[im 11/37]
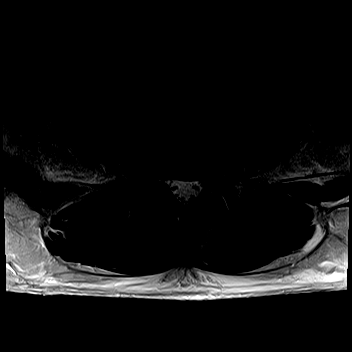
[im 15/37]
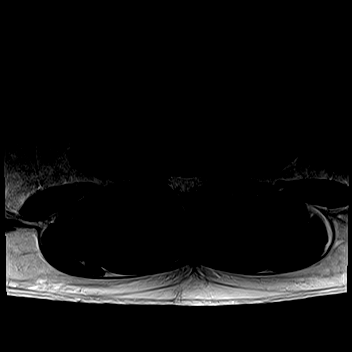
[im 19/37]
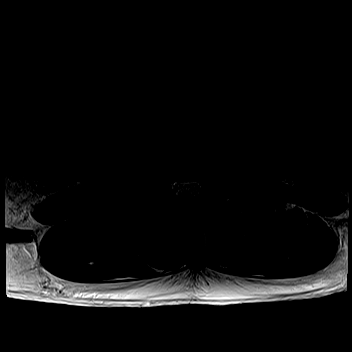
[im 22/37]
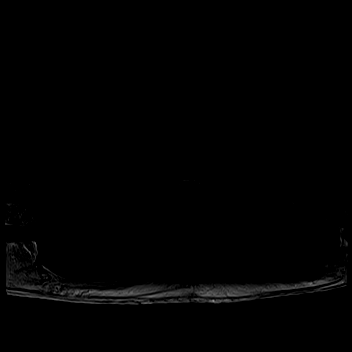
[im 26/37]
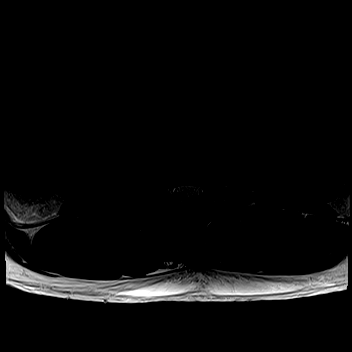
[im 29/37]
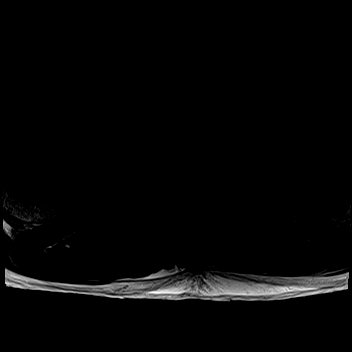
[im 33/37]
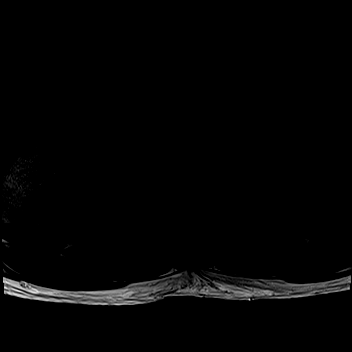
[im 37/37]
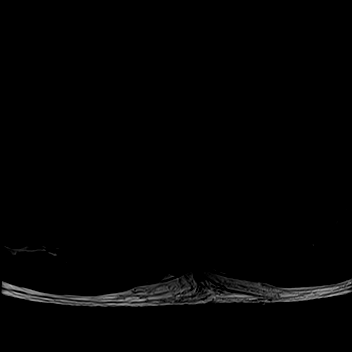

[Series 5: T1 · axial · 5.0mm · 0.34mm/px · z∈[-497,-445]mm · 2 of 37 slices shown (2 of 2)]
[im 1/37]
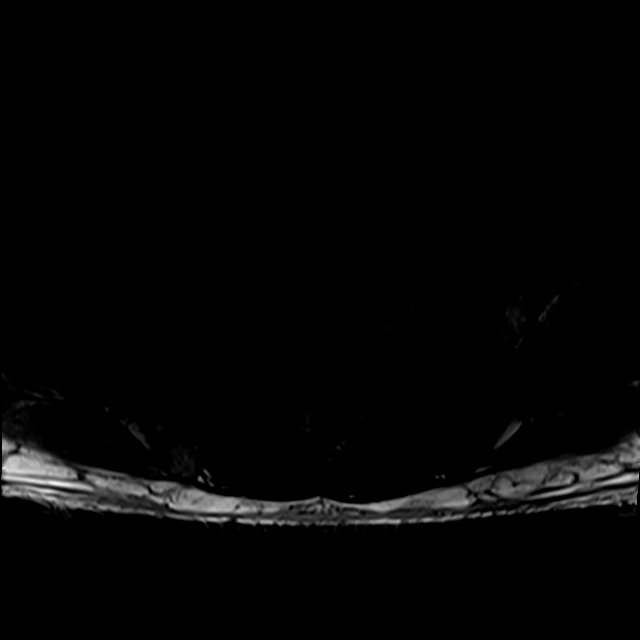
[im 8/37]
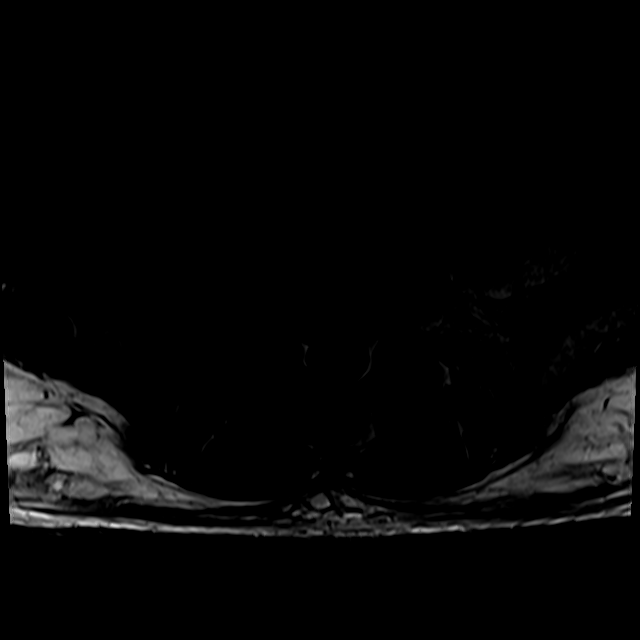

[Series 6: T2 · axial · 5.0mm · 0.69mm/px · z∈[-497,-240]mm · 11 of 37 slices shown (3 of 3)]
[im 1/37]
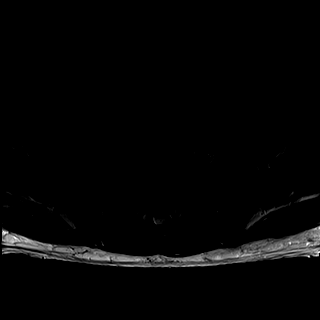
[im 4/37]
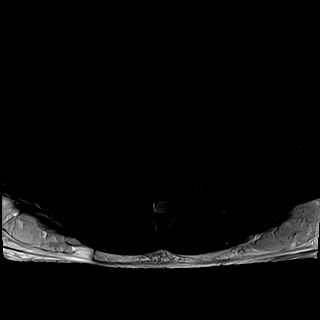
[im 8/37]
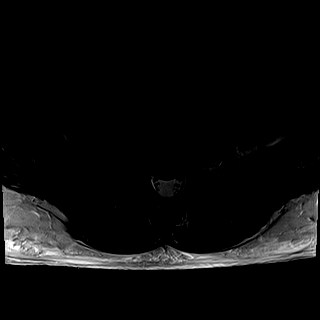
[im 11/37]
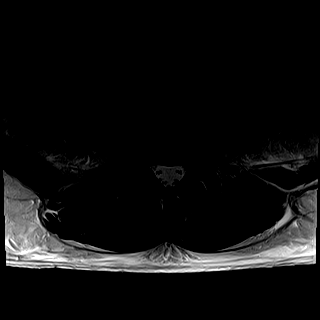
[im 15/37]
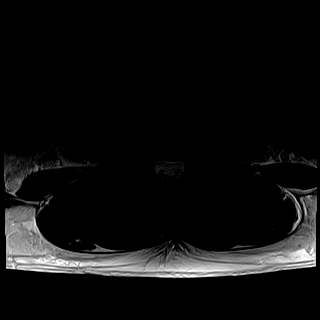
[im 19/37]
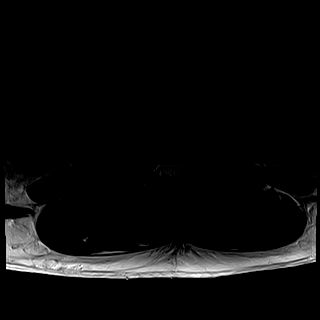
[im 22/37]
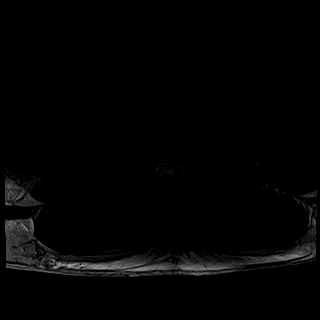
[im 26/37]
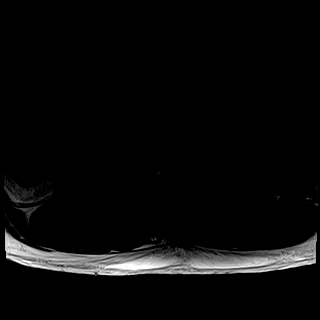
[im 29/37]
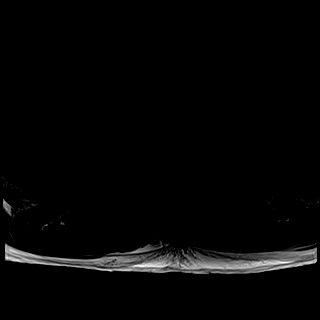
[im 33/37]
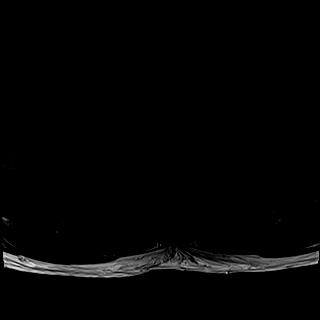
[im 37/37]
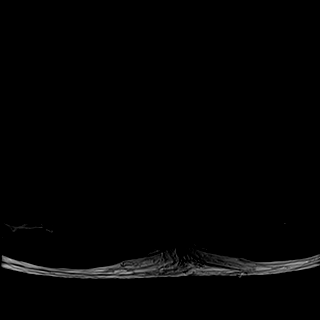

[34 of 48 positions shown; findings below may reference images not displayed]

FINDINGS: MRI THORACIC SPINE FINDINGS

Alignment:  Physiologic.

Vertebrae: There is moderate bone marrow edema at T6, T7 and T8.
There is edema within the T7-8 disc space.

Cord:  Normal signal and morphology.  No epidural collection.

Paraspinal and other soft tissues: Focal area of opacity at the
posterior right hemithorax near the area of abnormal midthoracic
bone marrow signal (series 24, image 15).

Disc levels:

T4-5: Small right subarticular disc protrusion without stenosis.

T6-7: Epidural lipomatosis effacing the CSF space.

T7-8: Epidural lipomatosis effacing the CSF space.

Other disc levels are unremarkable.

MRI LUMBAR SPINE FINDINGS

Segmentation:  Standard.

Alignment:  Physiologic.

Vertebrae:  No fracture, evidence of discitis, or bone lesion.

Conus medullaris and cauda equina: Conus extends to the L1 level.
Conus and cauda equina appear normal.

Paraspinal and other soft tissues: Negative.

Disc levels:

L1-L2: Normal disc space and facet joints. No spinal canal stenosis.
No neural foraminal stenosis.

L2-L3: Normal disc space and facet joints. No spinal canal stenosis.
No neural foraminal stenosis.

L3-L4: Normal disc space and facet joints. No spinal canal stenosis.
No neural foraminal stenosis.

L4-L5: Small central disc protrusion with mild facet hypertrophy,
unchanged. No spinal canal stenosis. No neural foraminal stenosis.

L5-S1: Unchanged small central disc protrusion with disc height
loss. No spinal canal stenosis. Unchanged severe right and moderate
left neural foraminal stenosis.

Visualized sacrum: Normal.
IMPRESSION: 1. Moderate bone marrow edema at T6, T7 and T8 with edema of the
T7-8 disc space. These findings could be degenerative or indicative
of discitis-osteomyelitis. No epidural abscess.
2. Area of abnormality in the posterior right lung, near the
abnormal thoracic vertebral levels. This is poorly visualized by MRI
and correlation with chest CT might be helpful.
3. Unchanged severe right and moderate left neural foraminal
stenosis at L5-S1.
4. Epidural lipomatosis at T6-7 and T7-8 effaces the CSF space.

## 2021-07-23 SURGERY — AMPUTATION BELOW KNEE
Anesthesia: Regional | Site: Knee | Laterality: Left

## 2021-07-23 MED ORDER — ASCORBIC ACID 500 MG PO TABS
1000.0000 mg | ORAL_TABLET | Freq: Every day | ORAL | Status: DC
  Administered 2021-07-23 – 2021-09-09 (×49): 1000 mg via ORAL
  Filled 2021-07-23 (×49): qty 2

## 2021-07-23 MED ORDER — POTASSIUM CHLORIDE CRYS ER 20 MEQ PO TBCR
20.0000 meq | EXTENDED_RELEASE_TABLET | Freq: Every day | ORAL | Status: AC | PRN
  Administered 2021-07-29: 40 meq via ORAL
  Filled 2021-07-23: qty 2

## 2021-07-23 MED ORDER — ACETAMINOPHEN 325 MG PO TABS
325.0000 mg | ORAL_TABLET | Freq: Four times a day (QID) | ORAL | Status: DC | PRN
  Administered 2021-07-30: 650 mg via ORAL
  Filled 2021-07-23: qty 2

## 2021-07-23 MED ORDER — LIDOCAINE 2% (20 MG/ML) 5 ML SYRINGE
INTRAMUSCULAR | Status: DC | PRN
  Administered 2021-07-23: 20 mg via INTRAVENOUS

## 2021-07-23 MED ORDER — CEFAZOLIN SODIUM-DEXTROSE 2-4 GM/100ML-% IV SOLN
2.0000 g | INTRAVENOUS | Status: DC
  Administered 2021-07-23: 2 g via INTRAVENOUS
  Filled 2021-07-23: qty 100

## 2021-07-23 MED ORDER — AMIODARONE LOAD VIA INFUSION
150.0000 mg | Freq: Once | INTRAVENOUS | Status: AC
  Administered 2021-07-23: 150 mg via INTRAVENOUS
  Filled 2021-07-23: qty 83.34

## 2021-07-23 MED ORDER — LABETALOL HCL 5 MG/ML IV SOLN
10.0000 mg | INTRAVENOUS | Status: DC | PRN
  Administered 2021-07-29: 10 mg via INTRAVENOUS
  Filled 2021-07-23: qty 4

## 2021-07-23 MED ORDER — HYDRALAZINE HCL 20 MG/ML IJ SOLN: 5.0000 mg | INTRAMUSCULAR | Status: DC | PRN

## 2021-07-23 MED ORDER — SODIUM CHLORIDE 0.9 % IV SOLN
2.0000 g | INTRAVENOUS | Status: DC
  Administered 2021-07-23 – 2021-07-24 (×2): 2 g via INTRAVENOUS
  Filled 2021-07-23 (×2): qty 20

## 2021-07-23 MED ORDER — PROPOFOL 10 MG/ML IV BOLUS
INTRAVENOUS | Status: AC
  Filled 2021-07-23: qty 40

## 2021-07-23 MED ORDER — LACTATED RINGERS IV BOLUS
500.0000 mL | Freq: Once | INTRAVENOUS | Status: AC
  Administered 2021-07-23: 500 mL via INTRAVENOUS

## 2021-07-23 MED ORDER — PHENYLEPHRINE HCL-NACL 20-0.9 MG/250ML-% IV SOLN
0.0000 ug/min | INTRAVENOUS | Status: DC
  Administered 2021-07-23: 20 ug/min via INTRAVENOUS
  Administered 2021-07-23: 35 ug/min via INTRAVENOUS
  Administered 2021-07-24 – 2021-07-25 (×3): 25 ug/min via INTRAVENOUS
  Filled 2021-07-23 (×5): qty 250

## 2021-07-23 MED ORDER — SODIUM CHLORIDE 0.9 % IV SOLN: INTRAVENOUS | Status: DC | PRN

## 2021-07-23 MED ORDER — METOPROLOL TARTRATE 5 MG/5ML IV SOLN
2.0000 mg | INTRAVENOUS | Status: AC | PRN
  Administered 2021-07-28: 5 mg via INTRAVENOUS
  Administered 2021-07-28: 2 mg via INTRAVENOUS
  Filled 2021-07-23 (×2): qty 5

## 2021-07-23 MED ORDER — MAGNESIUM SULFATE 2 GM/50ML IV SOLN
2.0000 g | Freq: Once | INTRAVENOUS | Status: AC
  Administered 2021-07-23: 2 g via INTRAVENOUS
  Filled 2021-07-23: qty 50

## 2021-07-23 MED ORDER — ALUM & MAG HYDROXIDE-SIMETH 200-200-20 MG/5ML PO SUSP: 15.0000 mL | ORAL | Status: DC | PRN

## 2021-07-23 MED ORDER — BUPIVACAINE-EPINEPHRINE (PF) 0.5% -1:200000 IJ SOLN
INTRAMUSCULAR | Status: DC | PRN
  Administered 2021-07-23: 30 mL via PERINEURAL

## 2021-07-23 MED ORDER — DOCUSATE SODIUM 100 MG PO CAPS
100.0000 mg | ORAL_CAPSULE | Freq: Every day | ORAL | Status: DC
  Administered 2021-07-24 – 2021-08-28 (×23): 100 mg via ORAL
  Filled 2021-07-23 (×30): qty 1

## 2021-07-23 MED ORDER — PERFLUTREN LIPID MICROSPHERE
1.0000 mL | INTRAVENOUS | Status: AC | PRN
Start: 2021-07-23 — End: 2021-07-23
  Administered 2021-07-23: 2 mL via INTRAVENOUS
  Filled 2021-07-23: qty 10

## 2021-07-23 MED ORDER — MAGNESIUM CITRATE PO SOLN: 1.0000 | Freq: Once | ORAL | Status: DC | PRN

## 2021-07-23 MED ORDER — MIDAZOLAM HCL 2 MG/2ML IJ SOLN
INTRAMUSCULAR | Status: AC
  Filled 2021-07-23: qty 2

## 2021-07-23 MED ORDER — HYDROMORPHONE HCL 1 MG/ML IJ SOLN
0.5000 mg | INTRAMUSCULAR | Status: DC | PRN
  Administered 2021-07-23 – 2021-07-24 (×2): 1 mg via INTRAVENOUS
  Filled 2021-07-23 (×2): qty 1

## 2021-07-23 MED ORDER — POVIDONE-IODINE 10 % EX SWAB: 2.0000 "application " | Freq: Once | CUTANEOUS | Status: DC

## 2021-07-23 MED ORDER — MAGNESIUM SULFATE 2 GM/50ML IV SOLN
2.0000 g | Freq: Every day | INTRAVENOUS | Status: AC | PRN
  Administered 2021-07-29: 2 g via INTRAVENOUS
  Filled 2021-07-23: qty 50

## 2021-07-23 MED ORDER — SODIUM CHLORIDE 0.9 % IV SOLN: INTRAVENOUS | Status: DC

## 2021-07-23 MED ORDER — JUVEN PO PACK
1.0000 | PACK | Freq: Two times a day (BID) | ORAL | Status: DC
  Administered 2021-07-23 – 2021-07-31 (×13): 1 via ORAL
  Filled 2021-07-23 (×12): qty 1

## 2021-07-23 MED ORDER — NALOXONE HCL 0.4 MG/ML IJ SOLN
INTRAMUSCULAR | Status: AC
  Administered 2021-07-23: 0.4 mg via INTRAVENOUS
  Filled 2021-07-23: qty 1

## 2021-07-23 MED ORDER — FENTANYL CITRATE (PF) 100 MCG/2ML IJ SOLN
100.0000 ug | Freq: Once | INTRAMUSCULAR | Status: AC
  Administered 2021-07-23: 100 ug via INTRAVENOUS

## 2021-07-23 MED ORDER — TRANEXAMIC ACID-NACL 1000-0.7 MG/100ML-% IV SOLN
1000.0000 mg | INTRAVENOUS | Status: AC
  Administered 2021-07-23: 1000 mg via INTRAVENOUS
  Filled 2021-07-23: qty 100

## 2021-07-23 MED ORDER — NALOXONE HCL 0.4 MG/ML IJ SOLN: 0.4000 mg | INTRAMUSCULAR | Status: AC

## 2021-07-23 MED ORDER — BISACODYL 5 MG PO TBEC: 5.0000 mg | DELAYED_RELEASE_TABLET | Freq: Every day | ORAL | Status: DC | PRN

## 2021-07-23 MED ORDER — ONDANSETRON HCL 4 MG/2ML IJ SOLN
INTRAMUSCULAR | Status: AC
  Filled 2021-07-23: qty 2

## 2021-07-23 MED ORDER — OXYCODONE HCL 5 MG PO TABS
10.0000 mg | ORAL_TABLET | ORAL | Status: DC | PRN
  Administered 2021-07-23: 10 mg via ORAL
  Administered 2021-07-23 – 2021-07-24 (×2): 15 mg via ORAL
  Filled 2021-07-23 (×3): qty 3

## 2021-07-23 MED ORDER — 0.9 % SODIUM CHLORIDE (POUR BTL) OPTIME
TOPICAL | Status: DC | PRN
  Administered 2021-07-23: 1000 mL

## 2021-07-23 MED ORDER — DEXAMETHASONE SODIUM PHOSPHATE 10 MG/ML IJ SOLN
INTRAMUSCULAR | Status: DC | PRN
  Administered 2021-07-23: 5 mg via INTRAVENOUS

## 2021-07-23 MED ORDER — POLYETHYLENE GLYCOL 3350 17 G PO PACK: 17.0000 g | PACK | Freq: Every day | ORAL | Status: DC | PRN

## 2021-07-23 MED ORDER — PHENYLEPHRINE 40 MCG/ML (10ML) SYRINGE FOR IV PUSH (FOR BLOOD PRESSURE SUPPORT)
PREFILLED_SYRINGE | INTRAVENOUS | Status: DC | PRN
  Administered 2021-07-23: 80 ug via INTRAVENOUS

## 2021-07-23 MED ORDER — ONDANSETRON HCL 4 MG/2ML IJ SOLN: 4.0000 mg | Freq: Four times a day (QID) | INTRAMUSCULAR | Status: DC | PRN

## 2021-07-23 MED ORDER — HYDROMORPHONE HCL 1 MG/ML IJ SOLN
INTRAMUSCULAR | Status: AC
  Filled 2021-07-23: qty 0.5

## 2021-07-23 MED ORDER — FENTANYL CITRATE (PF) 100 MCG/2ML IJ SOLN
INTRAMUSCULAR | Status: AC
  Filled 2021-07-23: qty 2

## 2021-07-23 MED ORDER — PROPOFOL 10 MG/ML IV BOLUS
INTRAVENOUS | Status: DC | PRN
  Administered 2021-07-23: 150 mg via INTRAVENOUS

## 2021-07-23 MED ORDER — BUPIVACAINE LIPOSOME 1.3 % IJ SUSP
INTRAMUSCULAR | Status: DC | PRN
  Administered 2021-07-23: 10 mL via PERINEURAL

## 2021-07-23 MED ORDER — PANTOPRAZOLE SODIUM 40 MG PO TBEC
40.0000 mg | DELAYED_RELEASE_TABLET | Freq: Every day | ORAL | Status: DC
  Administered 2021-07-23 – 2021-07-24 (×2): 40 mg via ORAL
  Filled 2021-07-23 (×2): qty 1

## 2021-07-23 MED ORDER — CHLORHEXIDINE GLUCONATE 0.12 % MT SOLN: 15.0000 mL | OROMUCOSAL | Status: AC

## 2021-07-23 MED ORDER — FENTANYL CITRATE (PF) 250 MCG/5ML IJ SOLN
INTRAMUSCULAR | Status: AC
  Filled 2021-07-23: qty 5

## 2021-07-23 MED ORDER — DEXTROSE 50 % IV SOLN: 0.0000 mL | INTRAVENOUS | Status: DC | PRN

## 2021-07-23 MED ORDER — OXYCODONE HCL 5 MG PO TABS
5.0000 mg | ORAL_TABLET | ORAL | Status: DC | PRN
  Administered 2021-07-24 – 2021-07-25 (×5): 10 mg via ORAL
  Filled 2021-07-23 (×6): qty 2

## 2021-07-23 MED ORDER — INSULIN GLARGINE-YFGN 100 UNIT/ML ~~LOC~~ SOLN
20.0000 [IU] | Freq: Every day | SUBCUTANEOUS | Status: DC
Start: 2021-07-23 — End: 2021-07-23
  Administered 2021-07-23: 20 [IU] via SUBCUTANEOUS
  Filled 2021-07-23: qty 0.2

## 2021-07-23 MED ORDER — ZOLPIDEM TARTRATE 5 MG PO TABS: 5.0000 mg | ORAL_TABLET | Freq: Every evening | ORAL | Status: DC | PRN

## 2021-07-23 MED ORDER — MIDAZOLAM HCL 2 MG/2ML IJ SOLN: 2.0000 mg | Freq: Once | INTRAMUSCULAR | Status: AC

## 2021-07-23 MED ORDER — ACETAMINOPHEN 500 MG PO TABS: 1000.0000 mg | ORAL_TABLET | Freq: Once | ORAL | Status: DC

## 2021-07-23 MED ORDER — POTASSIUM CHLORIDE 10 MEQ/100ML IV SOLN
10.0000 meq | INTRAVENOUS | Status: AC
  Administered 2021-07-23: 10 meq via INTRAVENOUS
  Filled 2021-07-23: qty 100

## 2021-07-23 MED ORDER — CHLORHEXIDINE GLUCONATE 0.12 % MT SOLN
OROMUCOSAL | Status: AC
  Administered 2021-07-23: 15 mL via OROMUCOSAL
  Filled 2021-07-23: qty 15

## 2021-07-23 MED ORDER — MIDAZOLAM HCL 2 MG/2ML IJ SOLN
INTRAMUSCULAR | Status: AC
  Administered 2021-07-23: 2 mg via INTRAVENOUS
  Filled 2021-07-23: qty 2

## 2021-07-23 MED ORDER — PHENOL 1.4 % MT LIQD: 1.0000 | OROMUCOSAL | Status: DC | PRN

## 2021-07-23 MED ORDER — LACTATED RINGERS IV SOLN: INTRAVENOUS | Status: DC

## 2021-07-23 MED ORDER — INSULIN REGULAR(HUMAN) IN NACL 100-0.9 UT/100ML-% IV SOLN
INTRAVENOUS | Status: AC
  Administered 2021-07-23: 6.5 [IU]/h via INTRAVENOUS
  Filled 2021-07-23: qty 100

## 2021-07-23 MED ORDER — ZINC SULFATE 220 (50 ZN) MG PO CAPS
220.0000 mg | ORAL_CAPSULE | Freq: Every day | ORAL | Status: AC
  Administered 2021-07-23 – 2021-08-05 (×14): 220 mg via ORAL
  Filled 2021-07-23 (×14): qty 1

## 2021-07-23 MED ORDER — CHLORHEXIDINE GLUCONATE 4 % EX LIQD
60.0000 mL | Freq: Once | CUTANEOUS | Status: AC
  Administered 2021-07-23: 4 via TOPICAL
  Filled 2021-07-23: qty 60

## 2021-07-23 MED ORDER — AMIODARONE HCL IN DEXTROSE 360-4.14 MG/200ML-% IV SOLN
60.0000 mg/h | INTRAVENOUS | Status: AC
  Administered 2021-07-23: 60 mg/h via INTRAVENOUS

## 2021-07-23 MED ORDER — LIDOCAINE 2% (20 MG/ML) 5 ML SYRINGE
INTRAMUSCULAR | Status: AC
  Filled 2021-07-23: qty 5

## 2021-07-23 MED ORDER — POTASSIUM CHLORIDE 10 MEQ/50ML IV SOLN: 10.0000 meq | INTRAVENOUS | Status: DC

## 2021-07-23 MED ORDER — AMIODARONE HCL IN DEXTROSE 360-4.14 MG/200ML-% IV SOLN
30.0000 mg/h | INTRAVENOUS | Status: DC
  Administered 2021-07-23 – 2021-07-25 (×5): 30 mg/h via INTRAVENOUS
  Filled 2021-07-23 (×5): qty 200

## 2021-07-23 MED ORDER — HYDROMORPHONE HCL 1 MG/ML IJ SOLN
INTRAMUSCULAR | Status: DC | PRN
  Administered 2021-07-23 (×2): .5 mg via INTRAVENOUS

## 2021-07-23 MED ORDER — FENTANYL CITRATE (PF) 250 MCG/5ML IJ SOLN
INTRAMUSCULAR | Status: DC | PRN
  Administered 2021-07-23: 100 ug via INTRAVENOUS

## 2021-07-23 MED ORDER — MUPIROCIN 2 % EX OINT
1.0000 "application " | TOPICAL_OINTMENT | Freq: Two times a day (BID) | CUTANEOUS | Status: AC
  Administered 2021-07-23 – 2021-07-27 (×10): 1 via NASAL
  Filled 2021-07-23 (×5): qty 22

## 2021-07-23 MED ORDER — DEXAMETHASONE SODIUM PHOSPHATE 10 MG/ML IJ SOLN
INTRAMUSCULAR | Status: AC
  Filled 2021-07-23: qty 1

## 2021-07-23 MED ORDER — CHLORHEXIDINE GLUCONATE CLOTH 2 % EX PADS
6.0000 | MEDICATED_PAD | Freq: Every day | CUTANEOUS | Status: DC
  Administered 2021-07-23 – 2021-07-26 (×3): 6 via TOPICAL

## 2021-07-23 MED ORDER — DIPHENHYDRAMINE HCL 12.5 MG/5ML PO ELIX
12.5000 mg | ORAL_SOLUTION | ORAL | Status: DC | PRN
  Administered 2021-07-30: 25 mg via ORAL
  Filled 2021-07-23 (×2): qty 10

## 2021-07-23 MED ORDER — GUAIFENESIN-DM 100-10 MG/5ML PO SYRP
15.0000 mL | ORAL_SOLUTION | ORAL | Status: DC | PRN
  Filled 2021-07-23: qty 15

## 2021-07-23 MED ORDER — ONDANSETRON HCL 4 MG/2ML IJ SOLN
INTRAMUSCULAR | Status: DC | PRN
  Administered 2021-07-23: 4 mg via INTRAVENOUS

## 2021-07-23 SURGICAL SUPPLY — 37 items
BAG COUNTER SPONGE SURGICOUNT (BAG) IMPLANT
BAG SPNG CNTER NS LX DISP (BAG)
BLADE SAW RECIP 87.9 MT (BLADE) ×2 IMPLANT
BLADE SURG 21 STRL SS (BLADE) ×2 IMPLANT
BNDG COHESIVE 6X5 TAN STRL LF (GAUZE/BANDAGES/DRESSINGS) IMPLANT
CANISTER WOUND CARE 500ML ATS (WOUND CARE) ×2 IMPLANT
COVER SURGICAL LIGHT HANDLE (MISCELLANEOUS) ×2 IMPLANT
CUFF TOURN SGL QUICK 34 (TOURNIQUET CUFF) ×2
CUFF TRNQT CYL 34X4.125X (TOURNIQUET CUFF) ×1 IMPLANT
DRAPE INCISE IOBAN 66X45 STRL (DRAPES) ×2 IMPLANT
DRAPE U-SHAPE 47X51 STRL (DRAPES) ×2 IMPLANT
DRESSING PREVENA PLUS CUSTOM (GAUZE/BANDAGES/DRESSINGS) ×1 IMPLANT
DRSG PREVENA PLUS CUSTOM (GAUZE/BANDAGES/DRESSINGS) ×2
DURAPREP 26ML APPLICATOR (WOUND CARE) ×2 IMPLANT
ELECT REM PT RETURN 9FT ADLT (ELECTROSURGICAL) ×2
ELECTRODE REM PT RTRN 9FT ADLT (ELECTROSURGICAL) ×1 IMPLANT
GLOVE SURG ORTHO LTX SZ9 (GLOVE) ×2 IMPLANT
GLOVE SURG UNDER POLY LF SZ9 (GLOVE) ×2 IMPLANT
GOWN STRL REUS W/ TWL XL LVL3 (GOWN DISPOSABLE) ×2 IMPLANT
GOWN STRL REUS W/TWL XL LVL3 (GOWN DISPOSABLE) ×4
KIT BASIN OR (CUSTOM PROCEDURE TRAY) ×2 IMPLANT
KIT TURNOVER KIT B (KITS) ×2 IMPLANT
MANIFOLD NEPTUNE II (INSTRUMENTS) ×2 IMPLANT
NS IRRIG 1000ML POUR BTL (IV SOLUTION) ×2 IMPLANT
PACK ORTHO EXTREMITY (CUSTOM PROCEDURE TRAY) ×2 IMPLANT
PAD ARMBOARD 7.5X6 YLW CONV (MISCELLANEOUS) ×2 IMPLANT
PREVENA RESTOR ARTHOFORM 46X30 (CANNISTER) ×2 IMPLANT
SPONGE T-LAP 18X18 ~~LOC~~+RFID (SPONGE) IMPLANT
STAPLER VISISTAT 35W (STAPLE) IMPLANT
STOCKINETTE IMPERVIOUS LG (DRAPES) ×2 IMPLANT
SUT ETHILON 2 0 PSLX (SUTURE) IMPLANT
SUT SILK 2 0 (SUTURE) ×2
SUT SILK 2-0 18XBRD TIE 12 (SUTURE) ×1 IMPLANT
SUT VIC AB 1 CTX 27 (SUTURE) ×4 IMPLANT
TOWEL GREEN STERILE (TOWEL DISPOSABLE) ×2 IMPLANT
TUBE CONNECTING 12X1/4 (SUCTIONS) ×2 IMPLANT
YANKAUER SUCT BULB TIP NO VENT (SUCTIONS) ×2 IMPLANT

## 2021-07-23 NOTE — Anesthesia Procedure Notes (Signed)
Anesthesia Regional Block: Popliteal block   Pre-Anesthetic Checklist: , timeout performed,  Correct Patient, Correct Site, Correct Laterality,  Correct Procedure, Correct Position, site marked,  Risks and benefits discussed,  Surgical consent,  Pre-op evaluation,  At surgeon's request and post-op pain management  Laterality: Left  Prep: Maximum Sterile Barrier Precautions used, chloraprep       Needles:  Injection technique: Single-shot  Needle Type: Echogenic Stimulator Needle     Needle Length: 9cm  Needle Gauge: 22     Additional Needles:   Procedures:,,,, ultrasound used (permanent image in chart),,    Narrative:  Start time: 07/23/2021 8:55 AM End time: 07/23/2021 9:00 AM Injection made incrementally with aspirations every 5 mL.  Performed by: Personally  Anesthesiologist: Pervis Hocking, DO  Additional Notes: Monitors applied. No increased pain on injection. No increased resistance to injection. Injection made in 5cc increments. Good needle visualization. Patient tolerated procedure well.

## 2021-07-23 NOTE — Progress Notes (Signed)
eLink Physician-Brief Progress Note Patient Name: [name] DOB: Dec 20, 1965 MRN: [id number]   Date of Service  07/23/2021  HPI/Events of Note  BP 81/69, heart rate 151 (looks like atrial flutter with 2:1 conduction), EF on recent echocardiogram is 50-55 %. Per cardiology note do not treat the flutter with anti-arrhythmics or rate control agents since it's sepsis driven, treat underlying cause.  eICU Interventions  Electrolytes have been sent and results are pending, he is currently off Norepinephrine, will start Phenylephrine gtt for his low blood pressure in the hope that a reflex bradycardia might impact the rhythm or rate, will give a fluid bolus per sepsis protocols for the hypotension, will follow up pending electrolytes and replete as indicated.        [name] Lashonne Shull 07/23/2021, 3:54 AM

## 2021-07-23 NOTE — Progress Notes (Signed)
[facility] Progress Note Patient Name: [name] DOB: 1966-06-05 MRN: [id number]   Date of Service  07/23/2021  HPI/Events of Note  K+ 3.6, creatinine 2.49  eICU Interventions  KCL 10 meq iv Q 1 hour x 2 doses ordered.        [name] Deago Burruss 07/23/2021, 6:34 AM

## 2021-07-23 NOTE — Transfer of Care (Signed)
Immediate Anesthesia Transfer of Care Note  Patient: [name]  Procedure(s) Performed: LEFT BELOW KNEE AMPUTATION (Left: Knee)  Patient Location: PACU  Anesthesia Type:General and Regional  Level of Consciousness: awake and drowsy  Airway & Oxygen Therapy: Patient Spontanous Breathing and Patient connected to face mask oxygen  Post-op Assessment: Report given to RN and Post -op Vital signs reviewed and stable  Post vital signs: Reviewed and stable  Last Vitals:  Vitals Value Taken Time  BP 99/56 07/23/21 1041  Temp    Pulse 30 07/23/21 1045  Resp 16 07/23/21 1045  SpO2 99 % 07/23/21 1045  Vitals shown include unvalidated device data.  Last Pain:  Vitals:   07/23/21 0935  TempSrc:   PainSc: 0-No pain         Complications: No notable events documented.

## 2021-07-23 NOTE — Progress Notes (Addendum)
[facility] Progress Note Patient Name: [name] DOB: Nov 02, 1965 MRN: [id number]   Date of Service  07/23/2021  HPI/Events of Note  Blood sugar 421 mg / dl. Patient snoring loudly with a breathing pattern consistent with sleep apnea.  eICU Interventions  Will re-order Insulin gtt, will order a BMP to make sure his gap is still normal. Will order CPAP while asleep. Baseline ABG ordered.        [name] [name] 07/23/2021, 11:32 PM

## 2021-07-23 NOTE — Op Note (Signed)
   Date of Surgery: 07/23/2021  INDICATIONS: Mr. Postema is a 55 y.o.-year-old male who presents with abscess osteomyelitis and ulceration left foot.  PREOPERATIVE DIAGNOSIS: Abscess osteomyelitis ulceration left foot  POSTOPERATIVE DIAGNOSIS: Same.  PROCEDURE: Transtibial amputation Application of Prevena wound VAC  SURGEON: Sharol Given, M.D.  ANESTHESIA:  general  IV FLUIDS AND URINE: See anesthesia records.  ESTIMATED BLOOD LOSS: See anesthesia records.  COMPLICATIONS: None.  DESCRIPTION OF PROCEDURE: The patient was brought to the operating room after undergoing regional anesthetic. After adequate levels of anesthesia were obtained patient's lower extremity was prepped using DuraPrep draped into a sterile field. A timeout was called. The foot was draped out of the sterile field with impervious stockinette. A transverse incision was made 11 cm distal to the tibial tubercle. This curved proximally and a large posterior flap was created. The tibia was transected 1 cm proximal to the skin incision. The fibula was transected just proximal to the tibial incision. The tibia was beveled anteriorly. A large posterior flap was created. The sciatic nerve was pulled cut and allowed to retract. The vascular bundles were suture ligated with 2-0 silk. The deep and superficial fascial layers were closed using #1 Vicryl. The skin was closed using staples and 2-0 nylon. The wound was covered with a Prevena customizable and arthroform wound VAC.  The dressing was sealed with dermatac there was a good suction fit. A prosthetic shrinker and limb protector were applied. Patient was taken to the PACU in stable condition.   DISCHARGE PLANNING:  Antibiotic duration: 24 hours  Weightbearing: Nonweightbearing on the operative extremity  Pain medication: Opioid pathway  Dressing care/ Wound VAC: Continue wound VAC for 1 week after discharge  Discharge to: Discharge planning based on therapy's recommendations  for possible inpatient rehabilitation, outpatient rehabilitation, or discharge to home with therapy  Follow-up: In the office 1 week post operative.  Meridee Score, MD [place] 10:36 AM

## 2021-07-23 NOTE — Anesthesia Procedure Notes (Signed)
Procedure Name: LMA Insertion Date/Time: 07/23/2021 9:50 AM Performed by: Ardyth Harps, CRNA Pre-anesthesia Checklist: Patient identified, Emergency Drugs available, Suction available and Patient being monitored Patient Re-evaluated:Patient Re-evaluated prior to induction Oxygen Delivery Method: Circle System Utilized Preoxygenation: Pre-oxygenation with 100% oxygen Induction Type: IV induction Ventilation: Mask ventilation without difficulty LMA: LMA inserted LMA Size: 5.0 Number of attempts: 1 Airway Equipment and Method: Bite block Placement Confirmation: positive ETCO2 Tube secured with: Tape Dental Injury: Teeth and Oropharynx as per pre-operative assessment

## 2021-07-23 NOTE — Consult Note (Signed)
   NAME:  Alex Leahy, MRN:  [id number], DOB:  [birth year], LOS: 1 ADMISSION DATE:  07/21/2021, CONSULTATION DATE:  07/22/2021 REFERRING MD:  Broadus [name], CHIEF COMPLAINT:  Septic shock   History of Present Illness:  [name] is a 54 y.o. M who presented with concern of left leg infection. He states that about 5 days ago he stepped on an unknown object which he feels caused a wound to the area of previous L great toe amputation site resulting in an ulcer that over the last 3 days has become more purulent, erythematous, drainage, and with worsened pain and swelling. He has had shortness of breath, fatigue, and chills. He has been receiving fluid resuscitation to this point without appropriate hemodynamic response. PCCM was consulted for transfer and pressor support.   Pertinent  Medical History  poorly controlled type 2 diabetes mellitus, diabetic foot infection with prior right BKA, chronic diastolic CHF, hypertension, dyslipidemia  Significant Hospital Events: Including procedures, antibiotic start and stop dates in addition to other pertinent events   11/14 Admitted  Interim History / Subjective:  Patient describes feeling better since admission however feeling generally poorly still. His new back pain is what is bothering him most at this time.  Objective   Blood pressure (!) 85/67, pulse (!) 144, temperature (!) 100.5 F (38.1 C), temperature source Axillary, resp. rate (!) 28, height 6\' 3"  (1.905 m), weight 108.9 kg, SpO2 91 %.        Intake/Output Summary (Last 24 hours) at 07/23/2021 0720 Last data filed at 07/23/2021 0600 Gross per 24 hour  Intake 1219.39 ml  Output 1300 ml  Net -80.61 ml    Filed Weights   07/22/21 0015  Weight: 108.9 kg    Examination: Ill appearing man somnolent Heart sounds regular, tachy LLE wound unchanged Ext warm to touch Mentating okay  Plts down CBG okay  Resolved Hospital Problem list    Assessment & Plan:  Septic shock- 5L in so  far Diabetic foot infection- nonsalvagable LLE Strep agalactiae bacteremia Aflutter, reactive Hx CHF Severe back pain r/o osteo DM2 uncontrolled AKI on CKD4   - EKG still could be flutter; add mag/amio, hold on [facility] for now pending surgery - Stop stress steroids - Continue vanc/zosyn/clinda, narrow pending culture maturity - To OR today for L BKA - May need CVL, will see how settles out - Will need TTE and maybe TEE, back MRI r/o endocarditis and concurrent osteo  Best Practice (right click and "Reselect all SmartList Selections" daily)   Diet/type: NPO DVT prophylaxis: hold for OR GI prophylaxis: N/A Lines: N/A Foley:  N/A Code Status:  full code Last date of multidisciplinary goals of care discussion [11/15]   Patient critically ill due to shock Interventions to address this today pressor titration Risk of deterioration without these interventions is high  I personally spent 31 minutes providing critical care not including any separately billable procedures  Erskine Emery MD [facility]  Prefer epic messenger for cross cover needs If after hours, please call E-link

## 2021-07-23 NOTE — Progress Notes (Signed)
Patients heart in the 140-150s while at rest. Levo turned off. BP 118/77.  [place] notified.

## 2021-07-23 NOTE — Progress Notes (Signed)
  Amiodarone Drug - Drug Interaction Consult Note  Recommendations: No current significant drug interactions noted - continue to monitor.   Amiodarone is metabolized by the cytochrome P450 system and therefore has the potential to cause many drug interactions. Amiodarone has an average plasma half-life of 50 days (range 20 to 100 days).   There is potential for drug interactions to occur several weeks or months after stopping treatment and the onset of drug interactions may be slow after initiating amiodarone.   []  Statins: Increased risk of myopathy. Simvastatin- restrict dose to 20mg  daily. Other statins: counsel patients to report any muscle pain or weakness immediately.  []  Anticoagulants: Amiodarone can increase anticoagulant effect. Consider warfarin dose reduction. Patients should be monitored closely and the dose of anticoagulant altered accordingly, remembering that amiodarone levels take several weeks to stabilize.  []  Antiepileptics: Amiodarone can increase plasma concentration of phenytoin, the dose should be reduced. Note that small changes in phenytoin dose can result in large changes in levels. Monitor patient and counsel on signs of toxicity.  []  Beta blockers: increased risk of bradycardia, AV block and myocardial depression. Sotalol - avoid concomitant use.  []   Calcium channel blockers (diltiazem and verapamil): increased risk of bradycardia, AV block and myocardial depression.  []   Cyclosporine: Amiodarone increases levels of cyclosporine. Reduced dose of cyclosporine is recommended.  []  Digoxin dose should be halved when amiodarone is started.  []  Diuretics: increased risk of cardiotoxicity if hypokalemia occurs.  []  Oral hypoglycemic agents (glyburide, glipizide, glimepiride): increased risk of hypoglycemia. Patient's glucose levels should be monitored closely when initiating amiodarone therapy.   []  Drugs that prolong the QT interval:  Torsades de pointes risk may  be increased with concurrent use - avoid if possible.  Monitor QTc, also keep magnesium/potassium WNL if concurrent therapy can't be avoided.  Antibiotics: e.g. fluoroquinolones, erythromycin.  Antiarrhythmics: e.g. quinidine, procainamide, disopyramide, sotalol.  Antipsychotics: e.g. phenothiazines, haloperidol.   Lithium, tricyclic antidepressants, and methadone. Thank You,  Brain Hilts  07/23/2021 10:43 AM

## 2021-07-23 NOTE — Progress Notes (Signed)
Inpatient Rehab Admissions Coordinator:   Consult received.  Awaiting therapy recommendations.    Shann Medal, PT, DPT Admissions Coordinator [phone] 07/23/21  1:56 PM

## 2021-07-23 NOTE — Anesthesia Postprocedure Evaluation (Signed)
Anesthesia Post Note  Patient: [name]  Procedure(s) Performed: LEFT BELOW KNEE AMPUTATION (Left: Knee)     Patient location during evaluation: PACU Anesthesia Type: Regional and General Level of consciousness: awake and alert, oriented and patient cooperative Pain management: pain level controlled Vital Signs Assessment: post-procedure vital signs reviewed and stable Respiratory status: spontaneous breathing, nonlabored ventilation and respiratory function stable Cardiovascular status: blood pressure returned to baseline and stable Postop Assessment: no apparent nausea or vomiting Anesthetic complications: no   No notable events documented.  Last Vitals:  Vitals:   07/23/21 1126 07/23/21 1142  BP: (!) 90/57 95/62  Pulse: (!) 42 (!) 37  Resp: 13   Temp:  (!) 36.2 C  SpO2: 99% 97%    Last Pain:  Vitals:   07/23/21 1111  TempSrc:   PainSc: 0-No pain                 Pervis Hocking

## 2021-07-23 NOTE — Progress Notes (Signed)
Patient given conscious sedation as ordered by anesthesia prior to peripheral nerve block. Patient rested comfortably during block. BP did drop into the 60A systolic. O2 sat dropped to 89% with nasal cannula in place, replaced with face mask by anesthesia. O2 sats at 100% on mask. Dr. Sharol Given arrived at bedside and was unable to arouse patient. Ordered Narcan to be given. Orders received and carried out. Patient aroused and oriented x4, speaking with Dr. Sharol Given at this time.

## 2021-07-23 NOTE — Progress Notes (Signed)
Progress Note  Patient Name: [name] Date of Encounter: 07/23/2021  Primary Cardiologist: Sanda Klein, MD   Subjective   Back pain after surgery, no CP or SOB.  Inpatient Medications    Scheduled Meds:  vitamin C  1,000 mg Oral Daily   Chlorhexidine Gluconate Cloth  6 each Topical Daily   Chlorhexidine Gluconate Cloth  6 each Topical Q0600   [START ON 07/24/2021] docusate sodium  100 mg Oral Daily   insulin aspart  0-15 Units Subcutaneous TID WC   insulin aspart  0-5 Units Subcutaneous QHS   insulin glargine-yfgn  20 Units Subcutaneous QHS   mupirocin ointment  1 application Nasal BID   nutrition supplement (JUVEN)  1 packet Oral BID BM   pantoprazole  40 mg Oral Daily   zinc sulfate  220 mg Oral Daily   Continuous Infusions:  sodium chloride     sodium chloride 75 mL/hr at 07/23/21 1700   amiodarone 30 mg/hr (07/23/21 1700)   cefTRIAXone (ROCEPHIN)  IV Stopped (07/23/21 1355)   lactated ringers Stopped (07/23/21 1013)   magnesium sulfate bolus IVPB     norepinephrine (LEVOPHED) Adult infusion Stopped (07/23/21 0318)   phenylephrine (NEO-SYNEPHRINE) Adult infusion 25 mcg/min (07/23/21 1700)   PRN Meds: [START ON 07/24/2021] acetaminophen, alum & mag hydroxide-simeth, bisacodyl, dextrose, diphenhydrAMINE, guaiFENesin-dextromethorphan, hydrALAZINE, HYDROmorphone (DILAUDID) injection, labetalol, magnesium sulfate bolus IVPB, metoprolol tartrate, ondansetron, oxyCODONE, oxyCODONE, perflutren lipid microspheres (DEFINITY) IV suspension, phenol, polyethylene glycol, potassium chloride, zolpidem   Vital Signs    Vitals:   07/23/21 1600 07/23/21 1602 07/23/21 1700 07/23/21 1800  BP: 98/69  105/63 103/65  Pulse: 70  (!) 42 92  Resp: (!) 22  15   Temp:  98.2 F (36.8 C)    TempSrc:  Axillary    SpO2: 96%     Weight:      Height:        Intake/Output Summary (Last 24 hours) at 07/23/2021 1825 Last data filed at 07/23/2021 1700 Gross per 24 hour  Intake  3929.12 ml  Output 1450 ml  Net 2479.12 ml   Filed Weights   07/22/21 0015 07/23/21 0827  Weight: 108.9 kg 108.9 kg    Telemetry    Atrial flutter rates 80-90 - Personally Reviewed  ECG    Atrial flutter with variable AV block, rate 128- Personally Reviewed  Physical Exam   GEN: No acute distress.   Neck: No JVD Cardiac: Irregular rhythm, normal rate, no murmurs, rubs, or gallops.  Respiratory: Clear to auscultation bilaterally. GI: Soft, nontender, non-distended  MS: Bilateral amputation Neuro:  Nonfocal  Psych: Normal affect   Labs    Chemistry Recent Labs  Lab 07/21/21 2003 07/21/21 2249 07/22/21 0920 07/22/21 2034 07/23/21 0347  NA 123*   < > 126* 127* 128*  K 4.7   < > 3.9 3.7 3.6  CL 87*   < > 91* 97* 99  CO2 18*   < > 23 20* 18*  GLUCOSE 516*   < > 180* 216* 131*  BUN 40*   < > 45* 49* 50*  CREATININE 2.76*   < > 2.56* 2.56* 2.49*  CALCIUM 8.6*   < > 8.0* 7.8* 7.9*  PROT 5.6*  --   --   --   --   ALBUMIN 2.0*  --   --   --   --   AST 28  --   --   --   --   ALT 40  --   --   --   --  ALKPHOS 242*  --   --   --   --   BILITOT 1.0  --   --   --   --   GFRNONAA 26*   < > 29* 29* 30*  ANIONGAP 18*   < > 12 10 11    < > = values in this interval not displayed.     Hematology Recent Labs  Lab 07/22/21 0350 07/23/21 0347 07/23/21 1719  WBC 11.0* 10.8* 13.5*  RBC 5.43 5.14 5.16  HGB 15.7 14.5 14.3  HCT 46.3 41.9 43.0  MCV 85.3 81.5 83.3  MCH 28.9 28.2 27.7  MCHC 33.9 34.6 33.3  RDW 14.7 14.6 15.3  PLT 108* 87* 83*    Cardiac EnzymesNo results for input(s): TROPONINI in the last 168 hours. No results for input(s): TROPIPOC in the last 168 hours.   BNPNo results for input(s): BNP, PROBNP in the last 168 hours.   DDimer No results for input(s): DDIMER in the last 168 hours.   Radiology    DG Chest 1 View  Result Date: 07/22/2021 EXAM: EXAM CHEST  1 VIEW COMPARISON:  Portable AP chest today at 8:08 p.m. FINDINGS: Provided are 2 separate  lateral chest radiographs on this patient. There is a trace left pleural effusion but there is no convincing overlying infiltrate. Exaggerated thoracic kyphosis is noted with multilevel thoracic spine degenerative discs. IMPRESSION: Trace left pleural effusion visible on the lateral view, but no definitive adjacent infiltrate. Electronically Signed   By: Telford Nab M.D.   On: 07/22/2021 00:52   MR THORACIC SPINE WO CONTRAST  Result Date: 07/23/2021 CLINICAL DATA:  Back pain, sepsis concern for epidural abscess. EXAM: MRI THORACIC AND LUMBAR SPINE WITHOUT CONTRAST TECHNIQUE: Multiplanar and multiecho pulse sequences of the thoracic and lumbar spine were obtained without intravenous contrast. COMPARISON:  Lumbar spine MRI 11/02/2019 Chest CT 04/08/2021 FINDINGS: MRI THORACIC SPINE FINDINGS Alignment:  Physiologic. Vertebrae: There is moderate bone marrow edema at T6, T7 and T8. There is edema within the T7-8 disc space. Cord:  Normal signal and morphology.  No epidural collection. Paraspinal and other soft tissues: Focal area of opacity at the posterior right hemithorax near the area of abnormal midthoracic bone marrow signal (series 24, image 15). Disc levels: T4-5: Small right subarticular disc protrusion without stenosis. T6-7: Epidural lipomatosis effacing the CSF space. T7-8: Epidural lipomatosis effacing the CSF space. Other disc levels are unremarkable. MRI LUMBAR SPINE FINDINGS Segmentation:  Standard. Alignment:  Physiologic. Vertebrae:  No fracture, evidence of discitis, or bone lesion. Conus medullaris and cauda equina: Conus extends to the L1 level. Conus and cauda equina appear normal. Paraspinal and other soft tissues: Negative. Disc levels: L1-L2: Normal disc space and facet joints. No spinal canal stenosis. No neural foraminal stenosis. L2-L3: Normal disc space and facet joints. No spinal canal stenosis. No neural foraminal stenosis. L3-L4: Normal disc space and facet joints. No spinal canal  stenosis. No neural foraminal stenosis. L4-L5: Small central disc protrusion with mild facet hypertrophy, unchanged. No spinal canal stenosis. No neural foraminal stenosis. L5-S1: Unchanged small central disc protrusion with disc height loss. No spinal canal stenosis. Unchanged severe right and moderate left neural foraminal stenosis. Visualized sacrum: Normal. IMPRESSION: 1. Moderate bone marrow edema at T6, T7 and T8 with edema of the T7-8 disc space. These findings could be degenerative or indicative of discitis-osteomyelitis. No epidural abscess. 2. Area of abnormality in the posterior right lung, near the abnormal thoracic vertebral levels. This is poorly visualized by MRI  and correlation with chest CT might be helpful. 3. Unchanged severe right and moderate left neural foraminal stenosis at L5-S1. 4. Epidural lipomatosis at T6-7 and T7-8 effaces the CSF space. Electronically Signed   By: Ulyses Jarred M.D.   On: 07/23/2021 02:36   MR LUMBAR SPINE WO CONTRAST  Result Date: 07/23/2021 CLINICAL DATA:  Back pain, sepsis concern for epidural abscess. EXAM: MRI THORACIC AND LUMBAR SPINE WITHOUT CONTRAST TECHNIQUE: Multiplanar and multiecho pulse sequences of the thoracic and lumbar spine were obtained without intravenous contrast. COMPARISON:  Lumbar spine MRI 11/02/2019 Chest CT 04/08/2021 FINDINGS: MRI THORACIC SPINE FINDINGS Alignment:  Physiologic. Vertebrae: There is moderate bone marrow edema at T6, T7 and T8. There is edema within the T7-8 disc space. Cord:  Normal signal and morphology.  No epidural collection. Paraspinal and other soft tissues: Focal area of opacity at the posterior right hemithorax near the area of abnormal midthoracic bone marrow signal (series 24, image 15). Disc levels: T4-5: Small right subarticular disc protrusion without stenosis. T6-7: Epidural lipomatosis effacing the CSF space. T7-8: Epidural lipomatosis effacing the CSF space. Other disc levels are unremarkable. MRI LUMBAR  SPINE FINDINGS Segmentation:  Standard. Alignment:  Physiologic. Vertebrae:  No fracture, evidence of discitis, or bone lesion. Conus medullaris and cauda equina: Conus extends to the L1 level. Conus and cauda equina appear normal. Paraspinal and other soft tissues: Negative. Disc levels: L1-L2: Normal disc space and facet joints. No spinal canal stenosis. No neural foraminal stenosis. L2-L3: Normal disc space and facet joints. No spinal canal stenosis. No neural foraminal stenosis. L3-L4: Normal disc space and facet joints. No spinal canal stenosis. No neural foraminal stenosis. L4-L5: Small central disc protrusion with mild facet hypertrophy, unchanged. No spinal canal stenosis. No neural foraminal stenosis. L5-S1: Unchanged small central disc protrusion with disc height loss. No spinal canal stenosis. Unchanged severe right and moderate left neural foraminal stenosis. Visualized sacrum: Normal. IMPRESSION: 1. Moderate bone marrow edema at T6, T7 and T8 with edema of the T7-8 disc space. These findings could be degenerative or indicative of discitis-osteomyelitis. No epidural abscess. 2. Area of abnormality in the posterior right lung, near the abnormal thoracic vertebral levels. This is poorly visualized by MRI and correlation with chest CT might be helpful. 3. Unchanged severe right and moderate left neural foraminal stenosis at L5-S1. 4. Epidural lipomatosis at T6-7 and T7-8 effaces the CSF space. Electronically Signed   By: Ulyses Jarred M.D.   On: 07/23/2021 02:36   CT Foot Left Wo Contrast  Result Date: 07/22/2021 CLINICAL DATA:  Left foot infection EXAM: CT OF THE LEFT FOOT WITHOUT CONTRAST TECHNIQUE: Multidetector CT imaging of the left foot was performed according to the standard protocol. Multiplanar CT image reconstructions were also generated. COMPARISON:  None. FINDINGS: Bones/Joint/Cartilage Transmetatarsal amputation of the first and second digits of the left foot are noted. Intra osseous gas  and cortical erosion is seen involving the base of the residual proximal phalanx, metatarsal head, and sesamoid bones of the great toe in keeping with changes of osteomyelitis. Superimposed advanced degenerative arthritis of the first MTP joint. Remaining joint spaces are preserved. No acute fracture or dislocation. No additional sites of osseous erosion identified. Ligaments Suboptimally assessed by CT. Muscles and Tendons Unremarkable Soft tissues There is extensive soft tissue swelling involving the residual great toe with extensive subcutaneous gas surrounding the first MTP joint and extending medially and laterally into the plantar soft tissues subjacent to the second metatarsal. Moderate subcutaneous edema of the  left foot and ankle. No discrete drainable subcutaneous fluid collection identified. Plantar ulcer noted within the residual great toe. IMPRESSION: Extensive subcutaneous gas in keeping with aggressive soft tissue infection and erosive changes surrounding the first MTP joint in keeping with septic arthritis/osteomyelitis in this location. No discrete drainable fluid collection identified. Electronically Signed   By: Fidela Salisbury M.D.   On: 07/22/2021 02:33   DG Chest Port 1 View  Result Date: 07/21/2021 CLINICAL DATA:  55 year old male with pain. EXAM: PORTABLE CHEST 1 VIEW COMPARISON:  None. FINDINGS: UPPER limits normal heart size noted. Equivocal LEFT LOWER lobe retrocardiac density/atelectasis may be present. There is no other evidence of focal airspace disease, pulmonary edema, suspicious pulmonary nodule/mass, pleural effusion, or pneumothorax. No acute bony abnormalities are identified. IMPRESSION: 1. Equivocal LEFT LOWER lobe retrocardiac density/atelectasis. Recommend LATERAL chest radiograph if there is clinical suspicion for pneumonia. Electronically Signed   By: Margarette Canada M.D.   On: 07/21/2021 20:50   DG Toe Great Left  Result Date: 07/21/2021 CLINICAL DATA:  Left great toe  pain, osteomyelitis EXAM: LEFT GREAT TOE COMPARISON:  None. FINDINGS: Three view radiograph left great toe demonstrates resection of the distal phalanx of the second digit and probable trans phalangeal amputation proximal phalanx of the great toe. There is a soft tissue ulcer involving the distal, medial, plantar aspect the residual left greatq toe. There is subcutaneous gas within the a dorsal lateral soft tissues of the left great toe. There are periarticular lucencies within the a distal phalanx and distal aspect of the first metatarsal medially suspicious for changes of osteomyelitis/septic arthritis. Superimposed moderate degenerative arthritis of the first MTP joint. IMPRESSION: Postsurgical changes as described above. Subcutaneous gas within the dorsal lateral soft tissues at the level of the first MTP joint with subjacent periarticular lucency within the a base of the first proximal phalanx and distal aspect of the first metatarsal suspicious for changes of septic arthritis/osteomyelitis. This could be confirmed with contrast enhanced MRI examination. Electronically Signed   By: Fidela Salisbury M.D.   On: 07/21/2021 20:59   ECHOCARDIOGRAM LIMITED  Result Date: 07/23/2021    ECHOCARDIOGRAM LIMITED REPORT   Patient Name:   [name] Date of Exam: 07/23/2021 Medical Rec #:  [id number]      Height:       75.0 in Accession #:    [id number]     Weight:       240.0 lb Date of Birth:  1966/08/24      BSA:          2.371 m Patient Age:    55 years       BP:           98/69 mmHg Patient Gender: M              HR:           83 bpm. Exam Location:  Inpatient Procedure: 3D Echo, Cardiac Doppler, Color Doppler, Intracardiac Opacification            Agent and Limited Echo Indications:    Bacteremia  History:        Patient has prior history of Echocardiogram examinations, most                 recent 05/01/2021. CHF, Abnormal ECG, Arrythmias:Atrial                 Fibrillation and Atrial Flutter,  Signs/Symptoms:Bacteremia; Risk  Factors:Hypertension, Diabetes and Dyslipidemia.  Sonographer:    Roseanna Rainbow RDCS Referring Phys: [id number] [place]  1. Left ventricular ejection fraction, by estimation, is 35%. The left ventricle has moderately decreased function. The left ventricle demonstrates global hypokinesis. There is mild left ventricular hypertrophy. Left ventricular diastolic parameters are  indeterminate.  2. Right ventricular systolic function is moderately reduced. The right ventricular size is normal. There is moderately elevated pulmonary artery systolic pressure. The estimated right ventricular systolic pressure is 13.2 mmHg.  3. Left atrial size was mildly dilated.  4. Right atrial size was mildly dilated.  5. The mitral valve is normal in structure. Trivial mitral valve regurgitation. No evidence of mitral stenosis.  6. The aortic valve is tricuspid. Aortic valve regurgitation is not visualized. No aortic stenosis is present.  7. Aortic dilatation noted. There is mild dilatation of the aortic root, measuring 39 mm.  8. The inferior vena cava is dilated in size with <50% respiratory variability, suggesting right atrial pressure of 15 mmHg.  9. No vegetation visualized, but if high suspicion would need TEE. 10. The patient was in atrial fibrillation. FINDINGS  Left Ventricle: Left ventricular ejection fraction, by estimation, is 35%. The left ventricle has moderately decreased function. The left ventricle demonstrates global hypokinesis. Definity contrast agent was given IV to delineate the left ventricular endocardial borders. The left ventricular internal cavity size was normal in size. There is mild left ventricular hypertrophy. Left ventricular diastolic parameters are indeterminate. Right Ventricle: The right ventricular size is normal. Right ventricular systolic function is moderately reduced. There is moderately elevated pulmonary artery systolic pressure. The  tricuspid regurgitant velocity is 3.08 m/s, and with an assumed right atrial pressure of 15 mmHg, the estimated right ventricular systolic pressure is 44.0 mmHg. Left Atrium: Left atrial size was mildly dilated. Right Atrium: Right atrial size was mildly dilated. Pericardium: Trivial pericardial effusion is present. Mitral Valve: The mitral valve is normal in structure. There is mild thickening of the mitral valve leaflet(s). There is mild calcification of the mitral valve leaflet(s). Trivial mitral valve regurgitation. No evidence of mitral valve stenosis. Tricuspid Valve: The tricuspid valve is normal in structure. Tricuspid valve regurgitation is mild. Aortic Valve: The aortic valve is tricuspid. Aortic valve regurgitation is not visualized. No aortic stenosis is present. Pulmonic Valve: The pulmonic valve was normal in structure. Pulmonic valve regurgitation is not visualized. Aorta: Aortic dilatation noted. There is mild dilatation of the aortic root, measuring 39 mm. Venous: The inferior vena cava is dilated in size with less than 50% respiratory variability, suggesting right atrial pressure of 15 mmHg. LEFT VENTRICLE PLAX 2D LVIDd:         5.10 cm LVIDs:         4.40 cm LV PW:         1.50 cm LV IVS:        1.40 cm LVOT diam:     2.20 cm LVOT Area:     3.80 cm  IVC IVC diam: 2.90 cm LEFT ATRIUM         Index LA diam:    4.50 cm 1.90 cm/m   AORTA Ao Root diam: 3.60 cm Ao Asc diam:  3.90 cm TRICUSPID VALVE TR Peak grad:   37.9 mmHg TR Vmax:        308.00 cm/s  SHUNTS Systemic Diam: 2.20 cm Dalton McleanMD Electronically signed by Franki Monte Signature Date/Time: 07/23/2021/5:54:48 PM    Final     Cardiac Studies  As above  Patient Profile     55 y.o. male with a hx of HFpEF (EF = 50%), HTN, DM2, CKD 3, osteomyelitis s/p R BKA who is being seen 07/22/2021 for the evaluation of sepsis secondary to osteomyelitis   Assessment & Plan   Principal Problem:   Severe sepsis with acute organ dysfunction  [facility]) Active Problems:   Essential hypertension   Stage 3a chronic kidney disease (HCC)   Type 2 diabetes mellitus with diabetic polyneuropathy, with long-term current use of insulin (HCC)   Right below-knee amputee ([place])   Diabetic infection of left foot (HCC)   Chronic diastolic (congestive) heart failure (HCC)   Chest pain   AKI (acute kidney injury) ([place])   Gas gangrene of foot ([place])   Atrial flutter ([place])   Septic shock ([place])   Will await results of echocardiogram. In rate controlled atrial flutter today. Will need anticoagulation in the event he requires cardioversion inpatient, initiation to be decided by surgical team.   Patient has good understanding that aflutter is driven by sepsis, and may improve with convalescence and source control.  Continue IV amiodarone for now. Will add further therapy as needed but rates well controlled currently.      For questions or updates, please contact [place] Please consult [email or web address] for contact info under        Signed, Elouise Munroe, MD  07/23/2021, 6:25 PM

## 2021-07-23 NOTE — Consult Note (Signed)
[place] for Infectious Disease    Date of Admission:  07/21/2021     Total days of antibiotics 3               Reason for Consult: Osteomyelitis / Bacteremia  Referring Provider: [facility]  Primary Care Provider: Patient, No Pcp Per (Inactive)   ASSESSMENT:  [name] is a 55 y/o gentleman with poorly controlled diabetes admitted with diabetic foot ulcer and osteomyelitis now s/p left BKA and complicated by Group B Streptococcus bacteremia and possible thoracic discitis/osteomyelitis. Will narrow antibiotics to Ceftriaxone and repeat blood cultures for clearance of bacteremia. TTE is pending. Await surgical specimens sent for culture.  Discussed importance of diabetes control given last A1c of 13.8 significantly increasing risk of complicated healing and further infection. Will check Hepatitis C antibody for routine screening. Remaining medical and supportive care per Primary Team. ID will continue to follow.   PLAN:  Narrow antibiotics to ceftriaxone.  Repeat blood cultures.  Await results of TTE. Post-surgical wound care per orthopedics.  Remaining medical and supportive care per primary team.  Check Hepatitis C antibody for routine screening.  ID will continue to follow.    Principal Problem:   Severe sepsis with acute organ dysfunction (HCC) Active Problems:   Essential hypertension   Stage 3a chronic kidney disease (HCC)   Type 2 diabetes mellitus with diabetic polyneuropathy, with long-term current use of insulin (HCC)   Right below-knee amputee ([place])   Diabetic infection of left foot (HCC)   Chronic diastolic (congestive) heart failure (HCC)   Chest pain   AKI (acute kidney injury) ([place])   Gas gangrene of foot (HCC)   Atrial flutter (HCC)   Septic shock (HCC)    acetaminophen  1,000 mg Oral Once   [MAR Hold] Chlorhexidine Gluconate Cloth  6 each Topical Daily   [MAR Hold] Chlorhexidine Gluconate Cloth  6 each Topical Q0600   [MAR Hold] insulin aspart   0-15 Units Subcutaneous TID WC   [MAR Hold] insulin aspart  0-5 Units Subcutaneous QHS   [MAR Hold] mupirocin ointment  1 application Nasal BID   povidone-iodine  2 application Topical Once   tranexamic acid (CYKLOKAPRON) topical - INTRAOP  2,000 mg Topical To OR     HPI: [name] is a 55 y.o. male with previous medical history significant for poorly controlled diabetes complicated by diabetic foot infection s/p right BKA in 2017, hypertension, and diastolic heart failure presenting with worsening left foot wound.   [name] stepped on something with his left foot near the site of his prior great toe amputation developing an ulcer that has progressively worsened since initial onset and progressed faster of the past 2-3 days prior to admission. He had erythema, pain, swelling and purulent drainage from the site and was experiencing chills and fatigue. He attempted to treat with clean dressings and antibiotic ointment.  Of note [name] has also been in care for chronic low back pain.  [name]'s initial left foot x-ray with suspicion for septic arthritis/osteomyelitis of the first MTP with subjacent periarticular lucency within the base of the first proximal phalanx and distal aspect of the first metatarsal.  CT left foot with extensive subcutaneous gas with aggressive soft tissue infection and erosive changes surrounding the first MTP joint consistent with septic arthritis/osteomyelitis. MRI lumbar/thoracic spine with moderate pulmonary edema at T6, T7, and T8 with edema of the T7-T8 disc space possibly degenerative or osteomyelitis/discitis with no epidural abscess.  [name] was initially afebrile and received broad spectrum antibiotic coverage with concern for sepsis. Was febrile with max temperature of 100.5 F in the past 24 hours. Orthopedics was consulted with recommendation for BKA which occurred earlier today. Blood cultures are positive for Group B streptococcus in 4/4  bottles. Currently on Day 3 of antibiotic therapy with antibiotics with current therapy with piperacillin/tazobactam. MRSA PCR swab positive.   Review of Systems: Review of Systems  Constitutional:  Negative for chills, fever and weight loss.  Respiratory:  Negative for cough, shortness of breath and wheezing.   Cardiovascular:  Negative for chest pain.  Gastrointestinal:  Negative for abdominal pain, constipation, diarrhea, nausea and vomiting.  Skin:  Negative for rash.    Past Medical History:  Diagnosis Date   Chronic diastolic CHF (congestive heart failure) (HCC)    Diabetes mellitus without complication (HCC)    Hypertension     Social History   Tobacco Use   Smoking status: Never   Smokeless tobacco: Never  Vaping Use   Vaping Use: Never used  Substance Use Topics   Alcohol use: Never   Drug use: Never    Family History  Problem Relation Age of Onset   Anxiety disorder Mother    Cancer Father    Coronary artery disease Father     Allergies  Allergen Reactions   Bactrim [Sulfamethoxazole-Trimethoprim]    Ceprotin [Protein C Concentrate (Human)]    Ciprofloxacin Other (See Comments)    Kidney function   Levaquin [Levofloxacin]     OBJECTIVE: Blood pressure 95/62, pulse (!) 37, temperature (!) 97.2 F (36.2 C), resp. rate 13, height 6\' 3"  (1.905 m), weight 108.9 kg, SpO2 97 %.  Physical Exam Constitutional:      General: He is not in acute distress.    Appearance: He is well-developed.     Comments: Lying in PACU bed with head of bed elevated; drowsy and easily arousable  Cardiovascular:     Rate and Rhythm: Normal rate and regular rhythm.     Heart sounds: Normal heart sounds.  Pulmonary:     Effort: Pulmonary effort is normal.     Breath sounds: Normal breath sounds.  Skin:    General: Skin is warm and dry.  Neurological:     Mental Status: He is oriented to person, place, and time.    Lab Results Lab Results  Component Value Date   WBC 10.8  (H) 07/23/2021   HGB 14.5 07/23/2021   HCT 41.9 07/23/2021   MCV 81.5 07/23/2021   PLT 87 (L) 07/23/2021    Lab Results  Component Value Date   CREATININE 2.49 (H) 07/23/2021   BUN 50 (H) 07/23/2021   NA 128 (L) 07/23/2021   K 3.6 07/23/2021   CL 99 07/23/2021   CO2 18 (L) 07/23/2021    Lab Results  Component Value Date   ALT 40 07/21/2021   AST 28 07/21/2021   ALKPHOS 242 (H) 07/21/2021   BILITOT 1.0 07/21/2021     Microbiology: Recent Results (from the past 240 hour(s))  Resp Panel by RT-PCR (Flu A&B, Covid) Nasopharyngeal Swab     Status: None   Collection Time: 07/21/21  7:54 PM   Specimen: Nasopharyngeal Swab; Nasopharyngeal(NP) swabs in vial transport medium  Result Value Ref Range Status   SARS Coronavirus 2 by RT PCR NEGATIVE NEGATIVE Final    Comment: (NOTE) SARS-CoV-2 target nucleic acids are NOT DETECTED.  The SARS-CoV-2 RNA is generally detectable in upper  respiratory specimens during the acute phase of infection. The lowest concentration of SARS-CoV-2 viral copies this assay can detect is 138 copies/mL. A negative result does not preclude SARS-Cov-2 infection and should not be used as the sole basis for treatment or other patient management decisions. A negative result may occur with  improper specimen collection/handling, submission of specimen other than nasopharyngeal swab, presence of viral mutation(s) within the areas targeted by this assay, and inadequate number of viral copies(<138 copies/mL). A negative result must be combined with clinical observations, patient history, and epidemiological information. The expected result is Negative.  Fact Sheet for Patients:  EntrepreneurPulse.com.au  Fact Sheet for Healthcare Providers:  IncredibleEmployment.be  This test is no t yet approved or cleared by the [place] FDA and  has been authorized for detection and/or diagnosis of SARS-CoV-2 by FDA under an  Emergency Use Authorization (EUA). This EUA will remain  in effect (meaning this test can be used) for the duration of the COVID-19 declaration under Section 564(b)(1) of the Act, 21 U.S.C.section 360bbb-3(b)(1), unless the authorization is terminated  or revoked sooner.       Influenza A by PCR NEGATIVE NEGATIVE Final   Influenza B by PCR NEGATIVE NEGATIVE Final    Comment: (NOTE) The Xpert Xpress SARS-CoV-2/FLU/RSV plus assay is intended as an aid in the diagnosis of influenza from Nasopharyngeal swab specimens and should not be used as a sole basis for treatment. Nasal washings and aspirates are unacceptable for Xpert Xpress SARS-CoV-2/FLU/RSV testing.  Fact Sheet for Patients: EntrepreneurPulse.com.au  Fact Sheet for Healthcare Providers: IncredibleEmployment.be  This test is not yet approved or cleared by the [place] FDA and has been authorized for detection and/or diagnosis of SARS-CoV-2 by FDA under an Emergency Use Authorization (EUA). This EUA will remain in effect (meaning this test can be used) for the duration of the COVID-19 declaration under Section 564(b)(1) of the Act, 21 U.S.C. section 360bbb-3(b)(1), unless the authorization is terminated or revoked.  Performed at [facility], [place] [address], [place], [place] [address]   Culture, blood (Routine x 2)     Status: None (Preliminary result)   Collection Time: 07/21/21  8:00 PM   Specimen: BLOOD  Result Value Ref Range Status   Specimen Description BLOOD RIGHT ANTECUBITAL  Final   Special Requests   Final    BOTTLES DRAWN AEROBIC AND ANAEROBIC Blood Culture adequate volume   Culture  Setup Time   Final    GRAM POSITIVE COCCI IN CHAINS IN BOTH AEROBIC AND ANAEROBIC BOTTLES    Culture   Final    GRAM POSITIVE COCCI IDENTIFICATION TO FOLLOW Performed at [facility], [place] [address], [place], [place] [address]    Report Status PENDING  Incomplete   Culture, blood (Routine x 2)     Status: Abnormal (Preliminary result)   Collection Time: 07/21/21  8:03 PM   Specimen: BLOOD  Result Value Ref Range Status   Specimen Description BLOOD LEFT ARM  Final   Special Requests   Final    BOTTLES DRAWN AEROBIC AND ANAEROBIC Blood Culture adequate volume   Culture  Setup Time   Final    GRAM POSITIVE COCCI IN CHAINS IN BOTH AEROBIC AND ANAEROBIC BOTTLES CRITICAL RESULT CALLED TO, READ BACK BY AND VERIFIED WITH: PHARMD B BOLDEN [id number] AT 65 AM BY CM    Culture (A)  Final    GROUP B STREP(S.AGALACTIAE)ISOLATED SUSCEPTIBILITIES TO FOLLOW Performed at [facility], [place]  [address], [place], [place] [address]    Report Status PENDING  Incomplete  Blood Culture ID Panel (Reflexed)     Status: Abnormal   Collection Time: 07/21/21  8:03 PM  Result Value Ref Range Status   Enterococcus faecalis NOT DETECTED NOT DETECTED Final   Enterococcus Faecium NOT DETECTED NOT DETECTED Final   Listeria monocytogenes NOT DETECTED NOT DETECTED Final   Staphylococcus species NOT DETECTED NOT DETECTED Final   Staphylococcus aureus (BCID) NOT DETECTED NOT DETECTED Final   Staphylococcus epidermidis NOT DETECTED NOT DETECTED Final   Staphylococcus lugdunensis NOT DETECTED NOT DETECTED Final   Streptococcus species DETECTED (A) NOT DETECTED Final    Comment: CRITICAL RESULT CALLED TO, READ BACK BY AND VERIFIED WITH: PHARMD B BOLDEN [id number] AT 923 AM BY CM    Streptococcus agalactiae DETECTED (A) NOT DETECTED Final    Comment: CRITICAL RESULT CALLED TO, READ BACK BY AND VERIFIED WITH: PHARMD B BOLDEN [id number] AT 923 AM BY CM    Streptococcus pneumoniae NOT DETECTED NOT DETECTED Final   Streptococcus pyogenes NOT DETECTED NOT DETECTED Final   A.calcoaceticus-baumannii NOT DETECTED NOT DETECTED Final   Bacteroides fragilis NOT DETECTED NOT DETECTED Final   Enterobacterales NOT DETECTED NOT DETECTED Final   Enterobacter cloacae complex NOT DETECTED NOT  DETECTED Final   Escherichia coli NOT DETECTED NOT DETECTED Final   Klebsiella aerogenes NOT DETECTED NOT DETECTED Final   Klebsiella oxytoca NOT DETECTED NOT DETECTED Final   Klebsiella pneumoniae NOT DETECTED NOT DETECTED Final   Proteus species NOT DETECTED NOT DETECTED Final   Salmonella species NOT DETECTED NOT DETECTED Final   Serratia marcescens NOT DETECTED NOT DETECTED Final   Haemophilus influenzae NOT DETECTED NOT DETECTED Final   Neisseria meningitidis NOT DETECTED NOT DETECTED Final   Pseudomonas aeruginosa NOT DETECTED NOT DETECTED Final   Stenotrophomonas maltophilia NOT DETECTED NOT DETECTED Final   Candida albicans NOT DETECTED NOT DETECTED Final   Candida auris NOT DETECTED NOT DETECTED Final   Candida glabrata NOT DETECTED NOT DETECTED Final   Candida krusei NOT DETECTED NOT DETECTED Final   Candida parapsilosis NOT DETECTED NOT DETECTED Final   Candida tropicalis NOT DETECTED NOT DETECTED Final   Cryptococcus neoformans/gattii NOT DETECTED NOT DETECTED Final    Comment: Performed at [facility], [address] [place], [place] [address]  MRSA Next Gen by PCR, Nasal     Status: Abnormal   Collection Time: 07/22/21  5:02 PM   Specimen: Nasal Mucosa; Nasal Swab  Result Value Ref Range Status   MRSA by PCR Next Gen DETECTED (A) NOT DETECTED Final    Comment: RESULT CALLED TO, READ BACK BY AND VERIFIED WITH: ASHLEY ORE RN 07/22/2021 @2051  BY JW (NOTE) The GeneXpert MRSA Assay (FDA approved for NASAL specimens only), is one component of a comprehensive MRSA colonization surveillance program. It is not intended to diagnose MRSA infection nor to guide or monitor treatment for MRSA infections. Test performance is not FDA approved in patients less than 30 years old. Performed at [facility], [place] [address] [place], [place] [address]   Surgical pcr screen     Status: Abnormal   Collection Time: 07/22/21  8:53 PM   Specimen: Nasal Mucosa; Nasal Swab   Result Value Ref Range Status   MRSA, PCR POSITIVE (A) NEGATIVE Final    Comment: RESULT CALLED TO, READ BACK BY AND VERIFIED WITH: A ORR,RN@2230  07/22/21 [place]    Staphylococcus aureus POSITIVE (A) NEGATIVE Final  Comment: (NOTE) The Xpert SA Assay (FDA approved for NASAL specimens in patients 8 years of age and older), is one component of a comprehensive surveillance program. It is not intended to diagnose infection nor to guide or monitor treatment. Performed at [facility], [place] [address] [place], [place] [address]      Terri Piedra, [place] Infect[facility]  07/23/2021  11:54 AM

## 2021-07-23 NOTE — Progress Notes (Signed)
Orthopedic Tech Progress Note Patient Details:  [name] Aug 02, 1966 [id number]  Patient ID: [name], male   DOB: October 14, 1965, 55 y.o.   MRN: [id number]  Charline Bills Vivan Vanderveer 07/23/2021, 11:12 AM Called in brace order from [employer].

## 2021-07-23 NOTE — Anesthesia Procedure Notes (Signed)
Anesthesia Regional Block: Adductor canal block   Pre-Anesthetic Checklist: , timeout performed,  Correct Patient, Correct Site, Correct Laterality,  Correct Procedure, Correct Position, site marked,  Risks and benefits discussed,  Surgical consent,  Pre-op evaluation,  At surgeon's request and post-op pain management  Laterality: Left  Prep: Maximum Sterile Barrier Precautions used, chloraprep       Needles:  Injection technique: Single-shot  Needle Type: Echogenic Stimulator Needle     Needle Length: 9cm  Needle Gauge: 22     Additional Needles:   Procedures:,,,, ultrasound used (permanent image in chart),,    Narrative:  Start time: 07/23/2021 9:00 AM End time: 07/23/2021 9:05 AM Injection made incrementally with aspirations every 5 mL.  Performed by: Personally  Anesthesiologist: Pervis Hocking, DO  Additional Notes: Monitors applied. No increased pain on injection. No increased resistance to injection. Injection made in 5cc increments. Good needle visualization. Patient tolerated procedure well.

## 2021-07-23 NOTE — Interval H&P Note (Signed)
History and Physical Interval Note:  07/23/2021 7:02 AM  [name]  has presented today for surgery, with the diagnosis of Infection Left Foot.  The various methods of treatment have been discussed with the patient and family. After consideration of risks, benefits and other options for treatment, the patient has consented to  Procedure(s): LEFT BELOW KNEE AMPUTATION (Left) as a surgical intervention.  The patient's history has been reviewed, patient examined, no change in status, stable for surgery.  I have reviewed the patient's chart and labs.  Questions were answered to the patient's satisfaction.     [name]

## 2021-07-23 NOTE — Progress Notes (Signed)
Inpatient Diabetes Program Recommendations  AACE/ADA: New Consensus Statement on Inpatient Glycemic Control   Target Ranges:  Prepandial:   less than 140 mg/dL      Peak postprandial:   less than 180 mg/dL (1-2 hours)      Critically ill patients:  140 - 180 mg/dL   Results for [name], [name] (MRN [id number]) as of 07/23/2021 11:23  Ref. Range 07/23/2021 00:05 07/23/2021 03:43 07/23/2021 07:54 07/23/2021 09:11 07/23/2021 10:07 07/23/2021 10:39  Glucose-Capillary Latest Ref Range: 70 - 99 mg/dL 111 (H)  IV insulin stopped @ 00:06 136 (H) 152 (H) 175 (H)   Decadron 5 mg 178 (H)   Results for [name], [name] (MRN [id number]) as of 07/23/2021 11:23  Ref. Range 07/22/2021 12:45 07/22/2021 13:46 07/22/2021 14:52 07/22/2021 16:17 07/22/2021 17:02 07/22/2021 18:01 07/22/2021 19:05 07/22/2021 20:05 07/22/2021 21:04 07/22/2021 22:04 07/22/2021 23:04  Glucose-Capillary Latest Ref Range: 70 - 99 mg/dL 164 (H) 135 (H)  Levemir 25 units @13 :21   130 (H) 170 (H) 138 (H) 147 (H) 157 (H) 234 (H) 254 (H) 192 (H) 187 (H)   Semglee 15 units   Review of Glycemic Control  Diabetes history: DM Outpatient Diabetes medications: 70/30 20-30 units BID, Regular 1 unit for every 3 grams carbs Current orders for Inpatient glycemic control: Novolog 0-15 units TID with meals, Novolog 0-5 units QHS (HS insulin order signed and held)  Inpatient Diabetes Program Recommendations:    Insulin: Please consider ordering Semglee 30 units QHS.   Thanks, [name] Alderman, [name], [name], [name] Diabetes Coordinator Inpatient Diabetes Program [phone] (Team Pager from 8am to 5pm)

## 2021-07-23 NOTE — Progress Notes (Signed)
  Echocardiogram 2D Echocardiogram has been performed.  Bobbye Charleston 07/23/2021, 4:48 PM

## 2021-07-24 ENCOUNTER — Encounter (HOSPITAL_COMMUNITY): Payer: Self-pay | Admitting: Orthopedic Surgery

## 2021-07-24 ENCOUNTER — Telehealth: Payer: Self-pay | Admitting: Pulmonary Disease

## 2021-07-24 ENCOUNTER — Inpatient Hospital Stay (HOSPITAL_COMMUNITY): Payer: Medicare HMO

## 2021-07-24 DIAGNOSIS — L089 Local infection of the skin and subcutaneous tissue, unspecified: Secondary | ICD-10-CM | POA: Diagnosis not present

## 2021-07-24 DIAGNOSIS — N179 Acute kidney failure, unspecified: Secondary | ICD-10-CM | POA: Diagnosis not present

## 2021-07-24 DIAGNOSIS — I4892 Unspecified atrial flutter: Secondary | ICD-10-CM | POA: Diagnosis not present

## 2021-07-24 DIAGNOSIS — R6520 Severe sepsis without septic shock: Secondary | ICD-10-CM | POA: Diagnosis not present

## 2021-07-24 DIAGNOSIS — E11628 Type 2 diabetes mellitus with other skin complications: Secondary | ICD-10-CM | POA: Diagnosis not present

## 2021-07-24 DIAGNOSIS — A419 Sepsis, unspecified organism: Secondary | ICD-10-CM | POA: Diagnosis not present

## 2021-07-24 DIAGNOSIS — I5032 Chronic diastolic (congestive) heart failure: Secondary | ICD-10-CM | POA: Diagnosis not present

## 2021-07-24 LAB — POCT I-STAT 7, (LYTES, BLD GAS, ICA,H+H)
Acid-base deficit: 6 mmol/L — ABNORMAL HIGH (ref 0.0–2.0)
Bicarbonate: 19.6 mmol/L — ABNORMAL LOW (ref 20.0–28.0)
Calcium, Ion: 1.1 mmol/L — ABNORMAL LOW (ref 1.15–1.40)
HCT: 44 % (ref 39.0–52.0)
Hemoglobin: 15 g/dL (ref 13.0–17.0)
O2 Saturation: 96 %
Patient temperature: 97.3
Potassium: 4.5 mmol/L (ref 3.5–5.1)
Sodium: 125 mmol/L — ABNORMAL LOW (ref 135–145)
TCO2: 21 mmol/L — ABNORMAL LOW (ref 22–32)
pCO2 arterial: 38.5 mmHg (ref 32.0–48.0)
pH, Arterial: 7.312 — ABNORMAL LOW (ref 7.350–7.450)
pO2, Arterial: 83 mmHg (ref 83.0–108.0)

## 2021-07-24 LAB — BASIC METABOLIC PANEL
Anion gap: 11 (ref 5–15)
Anion gap: 12 (ref 5–15)
BUN: 75 mg/dL — ABNORMAL HIGH (ref 6–20)
BUN: 76 mg/dL — ABNORMAL HIGH (ref 6–20)
CO2: 17 mmol/L — ABNORMAL LOW (ref 22–32)
CO2: 17 mmol/L — ABNORMAL LOW (ref 22–32)
Calcium: 7.6 mg/dL — ABNORMAL LOW (ref 8.9–10.3)
Calcium: 7.8 mg/dL — ABNORMAL LOW (ref 8.9–10.3)
Chloride: 94 mmol/L — ABNORMAL LOW (ref 98–111)
Chloride: 97 mmol/L — ABNORMAL LOW (ref 98–111)
Creatinine, Ser: 3.16 mg/dL — ABNORMAL HIGH (ref 0.61–1.24)
Creatinine, Ser: 3.29 mg/dL — ABNORMAL HIGH (ref 0.61–1.24)
GFR, Estimated: 21 mL/min — ABNORMAL LOW (ref >60)
GFR, Estimated: 22 mL/min — ABNORMAL LOW (ref >60)
Glucose, Bld: 298 mg/dL — ABNORMAL HIGH (ref 70–99)
Glucose, Bld: 428 mg/dL — ABNORMAL HIGH (ref 70–99)
Potassium: 4.2 mmol/L (ref 3.5–5.1)
Potassium: 4.9 mmol/L (ref 3.5–5.1)
Sodium: 123 mmol/L — ABNORMAL LOW (ref 135–145)
Sodium: 125 mmol/L — ABNORMAL LOW (ref 135–145)

## 2021-07-24 LAB — CBC
HCT: 42.6 % (ref 39.0–52.0)
Hemoglobin: 14.5 g/dL (ref 13.0–17.0)
MCH: 28.2 pg (ref 26.0–34.0)
MCHC: 34 g/dL (ref 30.0–36.0)
MCV: 82.7 fL (ref 80.0–100.0)
Platelets: 89 10*3/uL — ABNORMAL LOW (ref 150–400)
RBC: 5.15 MIL/uL (ref 4.22–5.81)
RDW: 15.2 % (ref 11.5–15.5)
WBC: 19 10*3/uL — ABNORMAL HIGH (ref 4.0–10.5)
nRBC: 0 % (ref 0.0–0.2)

## 2021-07-24 LAB — GLUCOSE, CAPILLARY
Glucose-Capillary: 108 mg/dL — ABNORMAL HIGH (ref 70–99)
Glucose-Capillary: 135 mg/dL — ABNORMAL HIGH (ref 70–99)
Glucose-Capillary: 143 mg/dL — ABNORMAL HIGH (ref 70–99)
Glucose-Capillary: 165 mg/dL — ABNORMAL HIGH (ref 70–99)
Glucose-Capillary: 167 mg/dL — ABNORMAL HIGH (ref 70–99)
Glucose-Capillary: 169 mg/dL — ABNORMAL HIGH (ref 70–99)
Glucose-Capillary: 175 mg/dL — ABNORMAL HIGH (ref 70–99)
Glucose-Capillary: 184 mg/dL — ABNORMAL HIGH (ref 70–99)
Glucose-Capillary: 185 mg/dL — ABNORMAL HIGH (ref 70–99)
Glucose-Capillary: 195 mg/dL — ABNORMAL HIGH (ref 70–99)
Glucose-Capillary: 196 mg/dL — ABNORMAL HIGH (ref 70–99)
Glucose-Capillary: 203 mg/dL — ABNORMAL HIGH (ref 70–99)
Glucose-Capillary: 266 mg/dL — ABNORMAL HIGH (ref 70–99)
Glucose-Capillary: 334 mg/dL — ABNORMAL HIGH (ref 70–99)
Glucose-Capillary: 355 mg/dL — ABNORMAL HIGH (ref 70–99)
Glucose-Capillary: 369 mg/dL — ABNORMAL HIGH (ref 70–99)
Glucose-Capillary: 396 mg/dL — ABNORMAL HIGH (ref 70–99)

## 2021-07-24 LAB — HEPATITIS C ANTIBODY: HCV Ab: NONREACTIVE

## 2021-07-24 LAB — MAGNESIUM: Magnesium: 2.1 mg/dL (ref 1.7–2.4)

## 2021-07-24 LAB — CK: Total CK: 265 U/L (ref 49–397)

## 2021-07-24 LAB — SURGICAL PATHOLOGY

## 2021-07-24 IMAGING — DX DG CHEST 1V PORT
1 series · 1 of 1 positions shown · non-contrast
Comparison: Chest radiograph [DATE]

CLINICAL DATA: Sepsis, shortness of breath

EXAM:
PORTABLE CHEST 1 VIEW

[chest ap]
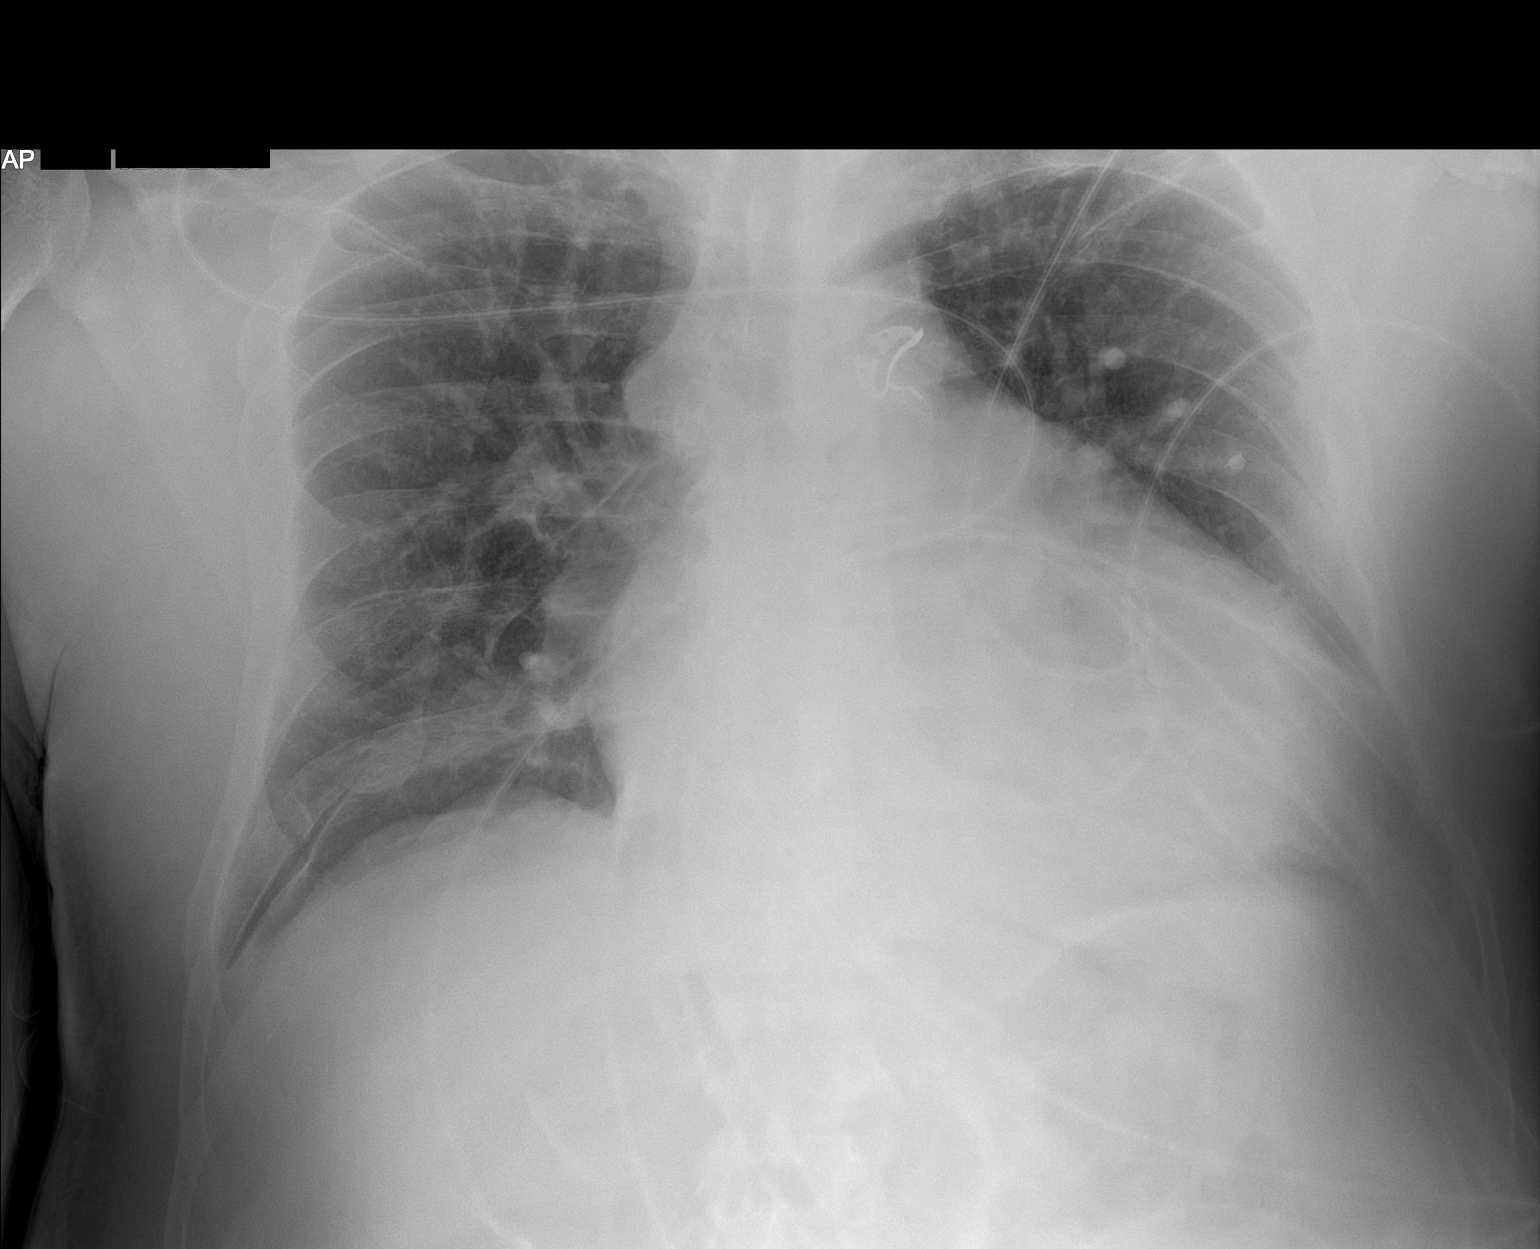

[1 of 1 positions shown; findings below may reference images not displayed]

FINDINGS: The heart is significantly enlarged, unchanged. The mediastinal
contours are stable.

There is vascular congestion without definite overt pulmonary edema.
There is no focal consolidation. The previously seen left pleural
effusion is not appreciated on the current study. There is no
pneumothorax.

The bones are stable.
IMPRESSION: 1. Cardiomegaly with vascular congestion without definite overt
pulmonary edema.
2. The previously seen small left pleural effusion is not seen on
the current study.

## 2021-07-24 MED ORDER — APIXABAN 2.5 MG PO TABS
2.5000 mg | ORAL_TABLET | Freq: Two times a day (BID) | ORAL | Status: DC
Start: 2021-07-24 — End: 2021-07-24

## 2021-07-24 MED ORDER — INSULIN ASPART 100 UNIT/ML IJ SOLN
3.0000 [IU] | Freq: Three times a day (TID) | INTRAMUSCULAR | Status: DC
  Administered 2021-07-24 – 2021-07-30 (×16): 3 [IU] via SUBCUTANEOUS

## 2021-07-24 MED ORDER — INSULIN DETEMIR 100 UNIT/ML ~~LOC~~ SOLN
25.0000 [IU] | Freq: Two times a day (BID) | SUBCUTANEOUS | Status: DC
  Administered 2021-07-24 – 2021-07-27 (×7): 25 [IU] via SUBCUTANEOUS
  Filled 2021-07-24 (×8): qty 0.25

## 2021-07-24 MED ORDER — CALCIUM GLUCONATE-NACL 1-0.675 GM/50ML-% IV SOLN
1.0000 g | Freq: Once | INTRAVENOUS | Status: AC
  Administered 2021-07-24: 06:00:00 1000 mg via INTRAVENOUS
  Filled 2021-07-24: qty 50

## 2021-07-24 MED ORDER — APIXABAN 2.5 MG PO TABS
2.5000 mg | ORAL_TABLET | Freq: Two times a day (BID) | ORAL | Status: DC
  Administered 2021-07-24 – 2021-07-26 (×4): 2.5 mg via ORAL
  Filled 2021-07-24 (×4): qty 1

## 2021-07-24 MED ORDER — INSULIN ASPART 100 UNIT/ML IJ SOLN
0.0000 [IU] | Freq: Three times a day (TID) | INTRAMUSCULAR | Status: DC
  Administered 2021-07-24 – 2021-07-25 (×2): 2 [IU] via SUBCUTANEOUS
  Administered 2021-07-26: 11 [IU] via SUBCUTANEOUS
  Administered 2021-07-26: 3 [IU] via SUBCUTANEOUS
  Administered 2021-07-26 – 2021-07-27 (×2): 5 [IU] via SUBCUTANEOUS
  Administered 2021-07-27: 8 [IU] via SUBCUTANEOUS
  Administered 2021-07-27 – 2021-07-28 (×2): 5 [IU] via SUBCUTANEOUS
  Administered 2021-07-28 (×2): 3 [IU] via SUBCUTANEOUS
  Administered 2021-07-29 (×2): 5 [IU] via SUBCUTANEOUS
  Administered 2021-07-29 – 2021-07-30 (×2): 3 [IU] via SUBCUTANEOUS
  Administered 2021-07-30: 15 [IU] via SUBCUTANEOUS
  Administered 2021-07-30: 11 [IU] via SUBCUTANEOUS
  Administered 2021-07-31: 8 [IU] via SUBCUTANEOUS
  Administered 2021-07-31 (×2): 11 [IU] via SUBCUTANEOUS
  Administered 2021-08-01: 3 [IU] via SUBCUTANEOUS
  Administered 2021-08-01: 5 [IU] via SUBCUTANEOUS
  Administered 2021-08-01: 11 [IU] via SUBCUTANEOUS
  Administered 2021-08-02: 2 [IU] via SUBCUTANEOUS
  Administered 2021-08-02: 11 [IU] via SUBCUTANEOUS
  Administered 2021-08-02 – 2021-08-03 (×2): 3 [IU] via SUBCUTANEOUS
  Administered 2021-08-03: 13:00:00 5 [IU] via SUBCUTANEOUS
  Administered 2021-08-03: 08:00:00 11 [IU] via SUBCUTANEOUS
  Administered 2021-08-04: 09:00:00 5 [IU] via SUBCUTANEOUS
  Administered 2021-08-04 – 2021-08-05 (×2): 3 [IU] via SUBCUTANEOUS
  Administered 2021-08-06: 5 [IU] via SUBCUTANEOUS
  Administered 2021-08-06: 11 [IU] via SUBCUTANEOUS
  Administered 2021-08-06: 8 [IU] via SUBCUTANEOUS
  Administered 2021-08-07 (×2): 2 [IU] via SUBCUTANEOUS
  Administered 2021-08-08: 3 [IU] via SUBCUTANEOUS
  Administered 2021-08-08: 11 [IU] via SUBCUTANEOUS
  Administered 2021-08-08: 5 [IU] via SUBCUTANEOUS
  Administered 2021-08-09 – 2021-08-10 (×3): 3 [IU] via SUBCUTANEOUS
  Administered 2021-08-10: 16:00:00 2 [IU] via SUBCUTANEOUS
  Administered 2021-08-10 – 2021-08-11 (×2): 3 [IU] via SUBCUTANEOUS
  Administered 2021-08-11: 2 [IU] via SUBCUTANEOUS
  Administered 2021-08-11: 3 [IU] via SUBCUTANEOUS
  Administered 2021-08-12 (×3): 5 [IU] via SUBCUTANEOUS
  Administered 2021-08-13: 3 [IU] via SUBCUTANEOUS
  Administered 2021-08-13: 2 [IU] via SUBCUTANEOUS
  Administered 2021-08-13: 8 [IU] via SUBCUTANEOUS
  Administered 2021-08-14: 3 [IU] via SUBCUTANEOUS
  Administered 2021-08-14 – 2021-08-15 (×2): 2 [IU] via SUBCUTANEOUS
  Administered 2021-08-15 (×2): 5 [IU] via SUBCUTANEOUS
  Administered 2021-08-16: 8 [IU] via SUBCUTANEOUS
  Administered 2021-08-16 – 2021-08-17 (×2): 3 [IU] via SUBCUTANEOUS
  Administered 2021-08-17: 5 [IU] via SUBCUTANEOUS
  Administered 2021-08-18: 3 [IU] via SUBCUTANEOUS
  Administered 2021-08-18: 8 [IU] via SUBCUTANEOUS
  Administered 2021-08-19: 5 [IU] via SUBCUTANEOUS
  Administered 2021-08-19 (×2): 3 [IU] via SUBCUTANEOUS
  Administered 2021-08-20: 8 [IU] via SUBCUTANEOUS
  Administered 2021-08-20: 07:00:00 3 [IU] via SUBCUTANEOUS
  Administered 2021-08-20 – 2021-08-21 (×3): 5 [IU] via SUBCUTANEOUS
  Administered 2021-08-21: 8 [IU] via SUBCUTANEOUS
  Administered 2021-08-22: 5 [IU] via SUBCUTANEOUS
  Administered 2021-08-22: 2 [IU] via SUBCUTANEOUS
  Administered 2021-08-22: 3 [IU] via SUBCUTANEOUS
  Administered 2021-08-23: 5 [IU] via SUBCUTANEOUS
  Administered 2021-08-23: 8 [IU] via SUBCUTANEOUS
  Administered 2021-08-24 (×2): 3 [IU] via SUBCUTANEOUS
  Administered 2021-08-24: 12:00:00 8 [IU] via SUBCUTANEOUS
  Administered 2021-08-25: 17:00:00 2 [IU] via SUBCUTANEOUS
  Administered 2021-08-25: 12:00:00 8 [IU] via SUBCUTANEOUS
  Administered 2021-08-25 – 2021-08-26 (×2): 3 [IU] via SUBCUTANEOUS
  Administered 2021-08-26 (×2): 5 [IU] via SUBCUTANEOUS
  Administered 2021-08-27 – 2021-08-29 (×4): 3 [IU] via SUBCUTANEOUS
  Administered 2021-08-29: 17:00:00 8 [IU] via SUBCUTANEOUS
  Administered 2021-08-30 (×2): 2 [IU] via SUBCUTANEOUS
  Administered 2021-08-30: 12:00:00 5 [IU] via SUBCUTANEOUS
  Administered 2021-08-31 (×2): 3 [IU] via SUBCUTANEOUS
  Administered 2021-08-31 – 2021-09-01 (×2): 2 [IU] via SUBCUTANEOUS
  Administered 2021-09-01: 08:00:00 3 [IU] via SUBCUTANEOUS
  Administered 2021-09-02: 19:00:00 2 [IU] via SUBCUTANEOUS
  Administered 2021-09-02: 12:00:00 3 [IU] via SUBCUTANEOUS
  Administered 2021-09-02: 10:00:00 8 [IU] via SUBCUTANEOUS
  Administered 2021-09-03: 13:00:00 3 [IU] via SUBCUTANEOUS
  Administered 2021-09-03: 18:00:00 2 [IU] via SUBCUTANEOUS
  Administered 2021-09-03 – 2021-09-04 (×2): 3 [IU] via SUBCUTANEOUS
  Administered 2021-09-04: 11:00:00 5 [IU] via SUBCUTANEOUS
  Administered 2021-09-05: 09:00:00 2 [IU] via SUBCUTANEOUS
  Administered 2021-09-06 (×2): 3 [IU] via SUBCUTANEOUS
  Administered 2021-09-06 – 2021-09-07 (×3): 2 [IU] via SUBCUTANEOUS
  Administered 2021-09-08 – 2021-09-09 (×4): 3 [IU] via SUBCUTANEOUS

## 2021-07-24 MED ORDER — INSULIN ASPART 100 UNIT/ML IJ SOLN
0.0000 [IU] | Freq: Every day | INTRAMUSCULAR | Status: DC
  Administered 2021-07-24: 21:00:00 2 [IU] via SUBCUTANEOUS
  Administered 2021-07-26 – 2021-07-28 (×3): 3 [IU] via SUBCUTANEOUS
  Administered 2021-07-29: 4 [IU] via SUBCUTANEOUS
  Administered 2021-07-30 – 2021-08-02 (×2): 3 [IU] via SUBCUTANEOUS
  Administered 2021-08-03: 22:00:00 2 [IU] via SUBCUTANEOUS
  Administered 2021-08-05: 5 [IU] via SUBCUTANEOUS
  Administered 2021-08-06: 2 [IU] via SUBCUTANEOUS
  Administered 2021-08-07: 5 [IU] via SUBCUTANEOUS
  Administered 2021-08-08 – 2021-08-20 (×7): 2 [IU] via SUBCUTANEOUS
  Administered 2021-08-21: 3 [IU] via SUBCUTANEOUS
  Administered 2021-08-22 – 2021-08-29 (×5): 2 [IU] via SUBCUTANEOUS

## 2021-07-24 MED ORDER — HYDROMORPHONE HCL 1 MG/ML IJ SOLN
0.5000 mg | INTRAMUSCULAR | Status: DC | PRN
  Administered 2021-07-24 – 2021-07-25 (×4): 0.5 mg via INTRAVENOUS
  Filled 2021-07-24 (×5): qty 0.5

## 2021-07-24 NOTE — Progress Notes (Addendum)
[facility] [place] Progress Note Patient Name: [name] DOB: 05-22-1966 MRN: [id number]   Date of Service  07/24/2021  HPI/Events of Note  Corrected sodium is 130 meq / L, he is on NS gtt @ 75 ml / hour. Ionized calcium 1.10.  eICU Interventions  Continue NS gtt. Calcium gluconate 1 gm iv bolus x 1.        [name] Timtohy Broski 07/24/2021, 4:58 AM

## 2021-07-24 NOTE — Progress Notes (Signed)
[facility]  Date of Admission:  07/21/2021     Total days of antibiotics 4         ASSESSMENT:  Mr. Perusse is POD #1 from left BKA secondary to osteomyelitis and complicated by Group B Streptococcus bacteremia and concern for discitis/osteomyelitis of his lower thoracic spine. Repeat blood cultures drawn today. TTE without evidence of endocarditis and likely to need TEE given multiple infections. Continue current dose of ceftriaxone. Wound care and VAC per orthopedics. Currently on insulin drip to help with diabetes control. Remaining medical and supportive care per primary team.   PLAN:  Continue current dose of ceftriaxone.  Wound care and VAC per orthopedics.  Consider TEE given multiple infections Remaining medical and supportive care per primary team.   Principal Problem:   Severe sepsis with acute organ dysfunction [facility]) Active Problems:   Essential hypertension   Stage 3a chronic kidney disease ([place])   Type 2 diabetes mellitus with diabetic polyneuropathy, with long-term current use of insulin (HCC)   Right below-knee amputee (HCC)   Diabetic infection of left foot (HCC)   Chronic diastolic (congestive) heart failure (HCC)   Chest pain   AKI (acute kidney injury) ([place])   Gas gangrene of foot (HCC)   Atrial flutter (HCC)   Septic shock (HCC)    apixaban  2.5 mg Oral BID   vitamin C  1,000 mg Oral Daily   Chlorhexidine Gluconate Cloth  6 each Topical Daily   Chlorhexidine Gluconate Cloth  6 each Topical Q0600   docusate sodium  100 mg Oral Daily   insulin aspart  0-15 Units Subcutaneous TID WC   insulin aspart  0-5 Units Subcutaneous QHS   insulin aspart  3 Units Subcutaneous TID WC   insulin detemir  25 Units Subcutaneous BID   mupirocin ointment  1 application Nasal BID   nutrition supplement (JUVEN)  1 packet Oral BID BM   zinc sulfate  220 mg Oral Daily    SUBJECTIVE:  Afebrile overnight with no acute events. Not feeling  well and has upper back pain.   Allergies  Allergen Reactions   Bactrim [Sulfamethoxazole-Trimethoprim]    Ceprotin [Protein C Concentrate (Human)]    Ciprofloxacin Other (See Comments)    Kidney function   Levaquin [Levofloxacin]      Review of Systems: Review of Systems  Constitutional:  Negative for chills, fever and weight loss.  Respiratory:  Negative for cough, shortness of breath and wheezing.   Cardiovascular:  Negative for chest pain and leg swelling.  Gastrointestinal:  Negative for abdominal pain, constipation, diarrhea, nausea and vomiting.  Musculoskeletal:  Positive for back pain.  Skin:  Negative for rash.     OBJECTIVE: Vitals:   07/24/21 1245 07/24/21 1300 07/24/21 1315 07/24/21 1330  BP: (!) 90/55 (!) 83/66 (!) 91/58 98/61  Pulse: (!) 42 (!) 42 (!) 42 (!) 42  Resp: 11 17 12 10   Temp:      TempSrc:      SpO2: 97% 97% 95% 95%  Weight:      Height:       Body mass index is 30 kg/m.  Physical Exam Constitutional:      General: He is not in acute distress.    Appearance: He is well-developed.     Comments: Lying in bed with head of bed elevated.   Cardiovascular:     Rate and Rhythm: Normal rate and regular rhythm.     Heart sounds:  Normal heart sounds.  Pulmonary:     Effort: Pulmonary effort is normal.     Breath sounds: Normal breath sounds.  Musculoskeletal:     Comments: Wound VAC in place with no drainage.   Skin:    General: Skin is warm and dry.  Neurological:     Mental Status: He is alert and oriented to person, place, and time.  Psychiatric:        Behavior: Behavior normal.        Thought Content: Thought content normal.        Judgment: Judgment normal.    Lab Results Lab Results  Component Value Date   WBC 19.0 (H) 07/24/2021   HGB 14.5 07/24/2021   HCT 42.6 07/24/2021   MCV 82.7 07/24/2021   PLT 89 (L) 07/24/2021    Lab Results  Component Value Date   CREATININE 3.29 (H) 07/24/2021   BUN 76 (H) 07/24/2021   NA 125  (L) 07/24/2021   K 4.2 07/24/2021   CL 97 (L) 07/24/2021   CO2 17 (L) 07/24/2021    Lab Results  Component Value Date   ALT 40 07/21/2021   AST 28 07/21/2021   ALKPHOS 242 (H) 07/21/2021   BILITOT 1.0 07/21/2021     Microbiology: Recent Results (from the past 240 hour(s))  Resp Panel by RT-PCR (Flu A&B, Covid) Nasopharyngeal Swab     Status: None   Collection Time: 07/21/21  7:54 PM   Specimen: Nasopharyngeal Swab; Nasopharyngeal(NP) swabs in vial transport medium  Result Value Ref Range Status   SARS Coronavirus 2 by RT PCR NEGATIVE NEGATIVE Final    Comment: (NOTE) SARS-CoV-2 target nucleic acids are NOT DETECTED.  The SARS-CoV-2 RNA is generally detectable in upper respiratory specimens during the acute phase of infection. The lowest concentration of SARS-CoV-2 viral copies this assay can detect is 138 copies/mL. A negative result does not preclude SARS-Cov-2 infection and should not be used as the sole basis for treatment or other patient management decisions. A negative result may occur with  improper specimen collection/handling, submission of specimen other than nasopharyngeal swab, presence of viral mutation(s) within the areas targeted by this assay, and inadequate number of viral copies(<138 copies/mL). A negative result must be combined with clinical observations, patient history, and epidemiological information. The expected result is Negative.  Fact Sheet for Patients:  EntrepreneurPulse.com.au  Fact Sheet for Healthcare Providers:  IncredibleEmployment.be  This test is no t yet approved or cleared by the [place] FDA and  has been authorized for detection and/or diagnosis of SARS-CoV-2 by FDA under an Emergency Use Authorization (EUA). This EUA will remain  in effect (meaning this test can be used) for the duration of the COVID-19 declaration under Section 564(b)(1) of the Act, 21 U.S.C.section 360bbb-3(b)(1), unless  the authorization is terminated  or revoked sooner.       Influenza A by PCR NEGATIVE NEGATIVE Final   Influenza B by PCR NEGATIVE NEGATIVE Final    Comment: (NOTE) The Xpert Xpress SARS-CoV-2/FLU/RSV plus assay is intended as an aid in the diagnosis of influenza from Nasopharyngeal swab specimens and should not be used as a sole basis for treatment. Nasal washings and aspirates are unacceptable for Xpert Xpress SARS-CoV-2/FLU/RSV testing.  Fact Sheet for Patients: EntrepreneurPulse.com.au  Fact Sheet for Healthcare Providers: IncredibleEmployment.be  This test is not yet approved or cleared by the [place] FDA and has been authorized for detection and/or diagnosis of SARS-CoV-2 by FDA under an Emergency Use Authorization (  EUA). This EUA will remain in effect (meaning this test can be used) for the duration of the COVID-19 declaration under Section 564(b)(1) of the Act, 21 U.S.C. section 360bbb-3(b)(1), unless the authorization is terminated or revoked.  Performed at [name], [place] [address], [place], [place] [address]   Culture, blood (Routine x 2)     Status: Abnormal (Preliminary result)   Collection Time: 07/21/21  8:00 PM   Specimen: BLOOD  Result Value Ref Range Status   Specimen Description BLOOD RIGHT ANTECUBITAL  Final   Special Requests   Final    BOTTLES DRAWN AEROBIC AND ANAEROBIC Blood Culture adequate volume   Culture  Setup Time   Final    GRAM POSITIVE COCCI IN CHAINS IN BOTH AEROBIC AND ANAEROBIC BOTTLES Performed at [facility], [place] [address], [place], [place] [address]    Culture GROUP B STREP(S.AGALACTIAE)ISOLATED (A)  Final   Report Status PENDING  Incomplete  Culture, blood (Routine x 2)     Status: Abnormal (Preliminary result)   Collection Time: 07/21/21  8:03 PM   Specimen: BLOOD  Result Value Ref Range Status   Specimen Description BLOOD LEFT ARM  Final   Special Requests   Final     BOTTLES DRAWN AEROBIC AND ANAEROBIC Blood Culture adequate volume   Culture  Setup Time   Final    GRAM POSITIVE COCCI IN CHAINS IN BOTH AEROBIC AND ANAEROBIC BOTTLES CRITICAL RESULT CALLED TO, READ BACK BY AND VERIFIED WITH: PHARMD B BOLDEN [id number] AT 53 AM BY CM    Culture (A)  Final    GROUP B STREP(S.AGALACTIAE)ISOLATED SUSCEPTIBILITIES TO FOLLOW REPEATING Performed at [facility], [place] [address], [place], [place] [address]    Report Status PENDING  Incomplete  Blood Culture ID Panel (Reflexed)     Status: Abnormal   Collection Time: 07/21/21  8:03 PM  Result Value Ref Range Status   Enterococcus faecalis NOT DETECTED NOT DETECTED Final   Enterococcus Faecium NOT DETECTED NOT DETECTED Final   Listeria monocytogenes NOT DETECTED NOT DETECTED Final   Staphylococcus species NOT DETECTED NOT DETECTED Final   Staphylococcus aureus (BCID) NOT DETECTED NOT DETECTED Final   Staphylococcus epidermidis NOT DETECTED NOT DETECTED Final   Staphylococcus lugdunensis NOT DETECTED NOT DETECTED Final   Streptococcus species DETECTED (A) NOT DETECTED Final    Comment: CRITICAL RESULT CALLED TO, READ BACK BY AND VERIFIED WITH: PHARMD B BOLDEN [id number] AT 923 AM BY CM    Streptococcus agalactiae DETECTED (A) NOT DETECTED Final    Comment: CRITICAL RESULT CALLED TO, READ BACK BY AND VERIFIED WITH: PHARMD B BOLDEN [id number] AT 923 AM BY CM    Streptococcus pneumoniae NOT DETECTED NOT DETECTED Final   Streptococcus pyogenes NOT DETECTED NOT DETECTED Final   A.calcoaceticus-baumannii NOT DETECTED NOT DETECTED Final   Bacteroides fragilis NOT DETECTED NOT DETECTED Final   Enterobacterales NOT DETECTED NOT DETECTED Final   Enterobacter cloacae complex NOT DETECTED NOT DETECTED Final   Escherichia coli NOT DETECTED NOT DETECTED Final   Klebsiella aerogenes NOT DETECTED NOT DETECTED Final   Klebsiella oxytoca NOT DETECTED NOT DETECTED Final   Klebsiella pneumoniae NOT DETECTED NOT DETECTED Final    Proteus species NOT DETECTED NOT DETECTED Final   Salmonella species NOT DETECTED NOT DETECTED Final   Serratia marcescens NOT DETECTED NOT DETECTED Final   Haemophilus influenzae NOT DETECTED NOT DETECTED Final   Neisseria meningitidis NOT DETECTED NOT DETECTED Final   Pseudomonas  aeruginosa NOT DETECTED NOT DETECTED Final   Stenotrophomonas maltophilia NOT DETECTED NOT DETECTED Final   Candida albicans NOT DETECTED NOT DETECTED Final   Candida auris NOT DETECTED NOT DETECTED Final   Candida glabrata NOT DETECTED NOT DETECTED Final   Candida krusei NOT DETECTED NOT DETECTED Final   Candida parapsilosis NOT DETECTED NOT DETECTED Final   Candida tropicalis NOT DETECTED NOT DETECTED Final   Cryptococcus neoformans/gattii NOT DETECTED NOT DETECTED Final    Comment: Performed at [facility], [place] [address] [place], [place] [address]  Urine Culture     Status: Abnormal   Collection Time: 07/22/21  3:26 AM   Specimen: In/Out Cath Urine  Result Value Ref Range Status   Specimen Description IN/OUT CATH URINE  Final   Special Requests NONE  Final   Culture (A)  Final    30,000 COLONIES/mL STREPTOCOCCUS AGALACTIAE TESTING AGAINST S. AGALACTIAE NOT ROUTINELY PERFORMED DUE TO PREDICTABILITY OF AMP/PEN/VAN SUSCEPTIBILITY. Performed at [facility], [place] [address], [place], [place] [address]    Report Status 07/23/2021 FINAL  Final  MRSA Next Gen by PCR, Nasal     Status: Abnormal   Collection Time: 07/22/21  5:02 PM   Specimen: Nasal Mucosa; Nasal Swab  Result Value Ref Range Status   MRSA by PCR Next Gen DETECTED (A) NOT DETECTED Final    Comment: RESULT CALLED TO, READ BACK BY AND VERIFIED WITH: ASHLEY ORE RN 07/22/2021 @2051  BY JW (NOTE) The GeneXpert MRSA Assay (FDA approved for NASAL specimens only), is one component of a comprehensive MRSA colonization surveillance program. It is not intended to diagnose MRSA infection nor to guide or monitor treatment for MRSA  infections. Test performance is not FDA approved in patients less than 54 years old. Performed at [facility], [place] [address], [place], [place] [address]   Surgical pcr screen     Status: Abnormal   Collection Time: 07/22/21  8:53 PM   Specimen: Nasal Mucosa; Nasal Swab  Result Value Ref Range Status   MRSA, PCR POSITIVE (A) NEGATIVE Final    Comment: RESULT CALLED TO, READ BACK BY AND VERIFIED WITH: A ORR,RN@2230  07/22/21 [place]    Staphylococcus aureus POSITIVE (A) NEGATIVE Final    Comment: (NOTE) The Xpert SA Assay (FDA approved for NASAL specimens in patients 49 years of age and older), is one component of a comprehensive surveillance program. It is not intended to diagnose infection nor to guide or monitor treatment. Performed at [facility], [place] [address] [place], [place] [address]      Terri Piedra, [facility]  07/24/2021  1:41 PM

## 2021-07-24 NOTE — Progress Notes (Signed)
Inpatient Rehab Admissions Coordinator:   Spoke with PT who states poor control of back pain, and expect pt to improve with mobility to be able to tolerate CIR once controlled.  Will follow.   Shann Medal, PT, DPT Admissions Coordinator [phone] 07/24/21  11:47 AM

## 2021-07-24 NOTE — Evaluation (Signed)
Physical Therapy Evaluation Patient Details Name: [name] MRN: [id number] DOB: 1966/04/30 Today's Date: 07/24/2021  History of Present Illness  55 yo admitted 11/14 with infected left foot s/p Left BKA 11/16. Pt with T6-8 discitis. PMhx: Rt BKA, Aflutter, DM, depression and anxiety  Clinical Impression  Pt pleasant and reports living alone with his dog but having available assist at D/C. Pt with significant back pain limiting mobility that will need to be further addressed and managed for pt to be able to progress with function. Pt highly motivated and would greatly benefit from CIR as pt able to progress activity with pain control. Pt with decreased functional mobility, strength and ROM who will benefit from acute therapy to maximize mobility and safety. Pt educated for amputee HEP and encouraged to perform as well as LUE elevation due to edema.     97% on 3L with drop to 86% on RA and return to 1L end of session at 92%     Recommendations for follow up therapy are one component of a multi-disciplinary discharge planning process, led by the attending physician.  Recommendations may be updated based on patient status, additional functional criteria and insurance authorization.  Follow Up Recommendations Acute inpatient rehab (3hours/day)    Assistance Recommended at Discharge Frequent or constant Supervision/Assistance  Functional Status Assessment Patient has had a recent decline in their functional status and demonstrates the ability to make significant improvements in function in a reasonable and predictable amount of time.  Equipment Recommendations  Wheelchair (measurements PT);Wheelchair cushion (measurements PT)    Recommendations for Other Services       Precautions / Restrictions Precautions Precautions: Fall;Back Precaution Comments: for comfort Required Braces or Orthoses: Other Brace Other Brace: LLE limb protector      Mobility  Bed Mobility Overal bed  mobility: Needs Assistance Bed Mobility: Rolling Rolling: Max assist         General bed mobility comments: max assist with pad to roll bil. Pt with extreme pain and required total +2 to slide toward HOB. With bed transition to partial chair HOB grossly 40 degrees with bil rails pt unable to pull forward to lift trunk from surface due to pain. Further mobility limited by pain    Transfers                   General transfer comment: unable    Ambulation/Gait                  Stairs            Wheelchair Mobility    Modified Rankin (Stroke Patients Only)       Balance                                             Pertinent Vitals/Pain Pain Assessment: 0-10 Pain Score: 10-Worst pain ever Pain Location: left mid thoracic Pain Descriptors / Indicators: Constant;Stabbing Pain Intervention(s): Limited activity within patient's tolerance;Monitored during session;Premedicated before session;Repositioned    Home Living Family/patient expects to be discharged to:: Private residence Living Arrangements: Alone Available Help at Discharge: Friend(s);Available 24 hours/day Type of Home: Apartment Home Access: Elevator       Home Layout: One level Home Equipment: Grab bars - toilet;Grab bars - tub/shower;Shower seat;Wheelchair - manual      Prior Function Prior Level of Function : Independent/Modified Independent  Mobility Comments: pt uses prosthetic on R LE ADLs Comments: independent, driving     Hand Dominance        Extremity/Trunk Assessment   Upper Extremity Assessment Upper Extremity Assessment: Defer to OT evaluation    Lower Extremity Assessment Lower Extremity Assessment: RLE deficits/detail;LLE deficits/detail RLE Deficits / Details: pt with limited hip flexion due to pain, WFL knee rom unable to assess hip ROM due to pt unable to tolerate sidelying RLE: Unable to fully assess due to pain LLE  Deficits / Details: pt with limited hip flexion due to pain, knee flexion grossly 15 degrees limited by pain, unable to assess hip extension with limited tolerance for hip ABduct/ADD LLE: Unable to fully assess due to pain       Communication   Communication: No difficulties  Cognition Arousal/Alertness: Awake/alert Behavior During Therapy: WFL for tasks assessed/performed Overall Cognitive Status: Impaired/Different from baseline Area of Impairment: Problem solving                             Problem Solving: Slow processing          General Comments      Exercises Amputee Exercises Quad Sets: AROM;Left;Supine;5 reps Hip ABduction/ADduction: AROM;Left;Supine;5 reps Straight Leg Raises: AAROM;Left;Supine;5 reps   Assessment/Plan    PT Assessment Patient needs continued PT services  PT Problem List Decreased strength;Decreased mobility;Decreased range of motion;Decreased activity tolerance;Decreased balance;Pain       PT Treatment Interventions Functional mobility training;Therapeutic activities;Patient/family education;Balance training;Therapeutic exercise;DME instruction    PT Goals (Current goals can be found in the Care Plan section)  Acute Rehab PT Goals Patient Stated Goal: be able to drive, get prosthetic and walk, return home PT Goal Formulation: With patient Time For Goal Achievement: 08/07/21 Potential to Achieve Goals: Fair    Frequency Min 3X/week   Barriers to discharge        Co-evaluation PT/OT/SLP Co-Evaluation/Treatment: Yes Reason for Co-Treatment: Complexity of the patient's impairments (multi-system involvement);For patient/therapist safety PT goals addressed during session: Mobility/safety with mobility;Strengthening/ROM         AM-PAC PT "6 Clicks" Mobility  Outcome Measure Help needed turning from your back to your side while in a flat bed without using bedrails?: Total Help needed moving from lying on your back to sitting  on the side of a flat bed without using bedrails?: Total Help needed moving to and from a bed to a chair (including a wheelchair)?: Total Help needed standing up from a chair using your arms (e.g., wheelchair or bedside chair)?: Total Help needed to walk in hospital room?: Total Help needed climbing 3-5 steps with a railing? : Total 6 Click Score: 6    End of Session   Activity Tolerance: Patient limited by pain Patient left: in bed;with nursing/sitter in room Nurse Communication: Mobility status;Need for lift equipment;Precautions PT Visit Diagnosis: Other abnormalities of gait and mobility (R26.89);Muscle weakness (generalized) (M62.81)    Time: 8315-1761 PT Time Calculation (min) (ACUTE ONLY): 21 min   Charges:   PT Evaluation $PT Eval Moderate Complexity: 1 Mod          Arless Vineyard P, PT Acute Rehabilitation Services Pager: [phone] Office: [phone]   Kelaiah Escalona B Arvil Utz 07/24/2021, 10:58 AM

## 2021-07-24 NOTE — Progress Notes (Signed)
Patient stating that he is in 10/10 pain, but drifting off to sleep, snoring in conversation. PRN IV dilaudid held at this time.   Blood sugar 108, insulin gtt on hold at this time.

## 2021-07-24 NOTE — Progress Notes (Addendum)
NAME:  [name], MRN:  [id number], DOB:  [birth year], LOS: 2 ADMISSION DATE:  07/21/2021, CONSULTATION DATE:  07/22/2021 REFERRING MD:  Broadus [name], CHIEF COMPLAINT:  Septic shock   History of Present Illness:  55 y.o. M who presented 11/14 with concern of left leg infection. He states that about 5 days ago he stepped on an unknown object which he feels caused a wound to the area of previous L great toe amputation site resulting in an ulcer that over the last 3 days has become more purulent, erythematous, drainage, and with worsened pain and swelling. He has had shortness of breath, fatigue, and chills. He has been receiving fluid resuscitation to this point without appropriate hemodynamic response. PCCM was consulted for transfer and pressor support. Transferred to ICU, to OR 11/16 for left transtibial amputation.   Pertinent  Medical History  Poorly controlled type 2 diabetes mellitus, diabetic foot infection with prior right BKA, chronic diastolic CHF, hypertension, dyslipidemia  Significant Hospital Events: Including procedures, antibiotic start and stop dates in addition to other pertinent events   11/14 Admitted with DKA, sepsis secondary to toe infection  11/16 S/p amputation by Dr. Sharol Given.   Interim History / Subjective:  Afebrile / WBC 19 Group B strep growing in blood cultures  [place] 3L  Glucose range 167-266 I/O 1.1L UOP, +2.6L in last 24 hours  Remains on insulin & neosynephrine infusions Pt reports mild SOB at rest   Objective   Blood pressure 112/68, pulse (!) 42, temperature 97.7 F (36.5 C), temperature source Oral, resp. rate 19, height 6\' 3"  (1.905 m), weight 108.9 kg, SpO2 100 %.        Intake/Output Summary (Last 24 hours) at 07/24/2021 0724 Last data filed at 07/24/2021 0400 Gross per 24 hour  Intake 3837.1 ml  Output 1200 ml  Net 2637.1 ml   Filed Weights   07/22/21 0015 07/23/21 0827  Weight: 108.9 kg 108.9 kg    Exam: General: adult male lying in bed in  NAD  HEENT: MM pink/moist, no jvd, anicteric Neuro: AAOx4, speech clear, MAE normal strength.  CV: s1s2 irr irr, AF on monitor, no m/r/g PULM: non-labored at rest, lungs bilaterally clear GI: soft, bsx4 active Extremities: warm/dry, BUE dependent edema.  LLE with post BKA amputation brace in place, RLE BKA.  Skin: no rashes or lesions  Resolved Hospital Problem list     Assessment & Plan:   Septic Shock in setting of Strep Agalactiae Bacteremia due to Diabetic Foot Infection  S/p 5L resuscitation with ongoing shock. -ceftriaxone per ID  -follow repeat blood cutlures  -levophed for MAP >65   Diabetic Foot Infection s/p Left Transtibial Amputation  Amputation 11/16 in setting of non-salvagable LLE -post operative & wound care per Dr. Sharol Given  -poor insight into DM control and end organ effects  -wound VAC per ortho, f/u as outpatient  -follow up surgical path  AKI on CKD IV Hyponatremia  Hypomagnesemia  Hypocalcemia  Corrected Na 130 -Trend BMP / urinary output -Replace electrolytes as indicated - mg, ca replaced 11/17  -Avoid nephrotoxic agents, ensure adequate renal perfusion -assess CK, r/o amiodarone   Atrial Flutter  Hx CHF  CHADsVASC 2 -tele monitoring  -continue amiodarone infusion  -plan for apixiban 11/17, reduced dose -appreciate Cardiology -PRN hydralazine, labetalol  -likely will need TEE, Cardiology considering for cardioversion anyway  Uncontrolled DM II with Hyperglycemia  Glucose not controlled, suspect now that we have source control this will improve.  Glucose >400 on 11/6  pm, insulin infusion started  -transition off insulin infusion to levemir 25 units BID, moderate SSI (renal failure) with meals and QHS  Dyspnea  -assess PCXR to r/o infiltrate / ALI from sepsis   Back Pain, Possible Discitis vs Osteomyelitis on MRI  MRI with T6,7,8 bone marrow edema, possible discitis / osteomyelitis. No epidural abscess.    -defer abx duration to ID  Best  Practice (right click and "Reselect all SmartList Selections" daily)  Diet/type: Carb modified DVT prophylaxis: apixiban GI prophylaxis: N/A Lines: N/A Foley:  N/A Code Status:  full code Last date of multidisciplinary goals of care discussion: full code   Critical Care Time: 55 minutes   Noe Gens, MSN, APRN, NP-C, AGACNP-BC [facility] 07/24/2021, 7:24 AM   Please see [email or web address] for pager details.   From 7A-7P if no response, please call [phone] After hours, please call ELink [phone]

## 2021-07-24 NOTE — Evaluation (Signed)
Occupational Therapy Evaluation Patient Details Name: [name] MRN: [id number] DOB: [birth year] Today's Date: 07/24/2021   History of Present Illness 55 yo admitted 11/14 with infected left foot s/p Left BKA 11/16. Pt with T6-8 discitis. PMhx: Rt BKA, Aflutter, DM, depression and anxiety   Clinical Impression   PTA pt independent using prosthetic and driving. Admitted for above and presenting with problem list below, including back pain and decreased activity tolerance. He requires up to total assist for ADLs and max assist for rolling in bed, unable to progress further activity today due to pain.  Believe he will progress well once pain is better controlled, and believe he will benefit from CIR at dc.  Encouraged  L UE elevation and hand pumps today for edema reduction. VSS on 3L O2 today.  Will follow acutely.      Recommendations for follow up therapy are one component of a multi-disciplinary discharge planning process, led by the attending physician.  Recommendations may be updated based on patient status, additional functional criteria and insurance authorization.   Follow Up Recommendations  Acute inpatient rehab (3hours/day)    Assistance Recommended at Discharge Frequent or constant Supervision/Assistance  Functional Status Assessment  Patient has had a recent decline in their functional status and demonstrates the ability to make significant improvements in function in a reasonable and predictable amount of time.  Equipment Recommendations  Other (comment) (defer to next venue)    Recommendations for Other Services Rehab consult     Precautions / Restrictions Precautions Precautions: Fall;Back Precaution Comments: for comfort Required Braces or Orthoses: Other Brace Other Brace: LLE limb protector Restrictions Weight Bearing Restrictions: No      Mobility Bed Mobility Overal bed mobility: Needs Assistance Bed Mobility: Rolling Rolling: Max assist          General bed mobility comments: max assist with pad to roll bil. Pt with extreme pain and required total +2 to slide toward HOB. With bed transition to partial chair HOB grossly 40 degrees with bil rails pt unable to pull forward to lift trunk from surface due to pain. Further mobility limited by pain    Transfers                   General transfer comment: unable      Balance                                           ADL either performed or assessed with clinical judgement   ADL Overall ADL's : Needs assistance/impaired     Grooming: Minimal assistance;Bed level   Upper Body Bathing: Minimal assistance;Bed level   Lower Body Bathing: Total assistance;+2 for physical assistance;+2 for safety/equipment;Bed level   Upper Body Dressing : Maximal assistance;Bed level   Lower Body Dressing: Total assistance;+2 for physical assistance;+2 for safety/equipment;Bed level     Toilet Transfer Details (indicate cue type and reason): deferred         Functional mobility during ADLs: Total assistance;+2 for physical assistance;+2 for safety/equipment       Vision         Perception     Praxis      Pertinent Vitals/Pain Pain Assessment: 0-10 Pain Score: 10-Worst pain ever Pain Location: left mid thoracic Pain Descriptors / Indicators: Constant;Stabbing Pain Intervention(s): Limited activity within patient's tolerance;Monitored during session;Repositioned;Premedicated before session     Hand Dominance  Right   Extremity/Trunk Assessment Upper Extremity Assessment Upper Extremity Assessment: Generalized weakness;LUE deficits/detail LUE Deficits / Details: edema limiting functional use LUE Coordination: decreased fine motor   Lower Extremity Assessment Lower Extremity Assessment: Defer to PT evaluation RLE Deficits / Details: pt with limited hip flexion due to pain, WFL knee rom unable to assess hip ROM due to pt unable to tolerate  sidelying RLE: Unable to fully assess due to pain LLE Deficits / Details: pt with limited hip flexion due to pain, knee flexion grossly 15 degrees limited by pain, unable to assess hip extension with limited tolerance for hip ABduct/ADD LLE: Unable to fully assess due to pain       Communication Communication Communication: No difficulties   Cognition Arousal/Alertness: Awake/alert Behavior During Therapy: WFL for tasks assessed/performed Overall Cognitive Status: Impaired/Different from baseline Area of Impairment: Problem solving;Attention                   Current Attention Level: Sustained         Problem Solving: Slow processing General Comments: pt with decreased attention and slow processing, distrated internally by pain     General Comments  educated on L UE elevation, Bil hand pumps to decrease UE edema    Exercises Amputee Exercises Quad Sets: AROM;Left;Supine;5 reps Hip ABduction/ADduction: AROM;Left;Supine;5 reps Straight Leg Raises: AAROM;Left;Supine;5 reps   Shoulder Instructions      Home Living Family/patient expects to be discharged to:: Private residence Living Arrangements: Alone Available Help at Discharge: Friend(s);Available 24 hours/day Type of Home: Apartment Home Access: Elevator     Home Layout: One level     Bathroom Shower/Tub: Teacher, early years/pre: Handicapped height     Home Equipment: Grab bars - toilet;Grab bars - tub/shower;Shower seat;Wheelchair - manual          Prior Functioning/Environment Prior Level of Function : Independent/Modified Independent             Mobility Comments: pt uses prosthetic on R LE ADLs Comments: independent, driving        OT Problem List: Decreased strength;Decreased activity tolerance;Impaired balance (sitting and/or standing);Decreased safety awareness;Decreased knowledge of use of DME or AE;Decreased knowledge of precautions;Pain;Obesity;Increased edema;Impaired  UE functional use      OT Treatment/Interventions: Self-care/ADL training;Therapeutic exercise;DME and/or AE instruction;Therapeutic activities;Patient/family education;Balance training    OT Goals(Current goals can be found in the care plan section) Acute Rehab OT Goals Patient Stated Goal: less pain OT Goal Formulation: With patient Time For Goal Achievement: 08/07/21 Potential to Achieve Goals: Good  OT Frequency: Min 2X/week   Barriers to D/C:            Co-evaluation PT/OT/SLP Co-Evaluation/Treatment: Yes Reason for Co-Treatment: For patient/therapist safety;To address functional/ADL transfers;Complexity of the patient's impairments (multi-system involvement) PT goals addressed during session: Mobility/safety with mobility;Strengthening/ROM OT goals addressed during session: ADL's and self-care      AM-PAC OT "6 Clicks" Daily Activity     Outcome Measure Help from another person eating meals?: A Little Help from another person taking care of personal grooming?: A Little Help from another person toileting, which includes using toliet, bedpan, or urinal?: Total Help from another person bathing (including washing, rinsing, drying)?: A Lot Help from another person to put on and taking off regular upper body clothing?: A Lot Help from another person to put on and taking off regular lower body clothing?: Total 6 Click Score: 12   End of Session Nurse Communication: Mobility status;Other (comment) (L  UE edema)  Activity Tolerance: Patient tolerated treatment well Patient left: in bed;with call bell/phone within reach;with nursing/sitter in room  OT Visit Diagnosis: Muscle weakness (generalized) (M62.81);Pain;Other abnormalities of gait and mobility (R26.89) Pain - part of body:  (back)                Time: 0321-2248 OT Time Calculation (min): 22 min Charges:  OT General Charges $OT Visit: 1 Visit OT Evaluation $OT Eval High Complexity: 1 High  Jolaine Artist, OT Acute  Rehabilitation Services Pager [phone] Office [phone]   Delight Stare 07/24/2021, 1:12 PM

## 2021-07-25 ENCOUNTER — Inpatient Hospital Stay (HOSPITAL_COMMUNITY): Payer: Medicare HMO

## 2021-07-25 DIAGNOSIS — L089 Local infection of the skin and subcutaneous tissue, unspecified: Secondary | ICD-10-CM | POA: Diagnosis not present

## 2021-07-25 DIAGNOSIS — I4892 Unspecified atrial flutter: Secondary | ICD-10-CM | POA: Diagnosis not present

## 2021-07-25 DIAGNOSIS — N179 Acute kidney failure, unspecified: Secondary | ICD-10-CM | POA: Diagnosis not present

## 2021-07-25 DIAGNOSIS — I5032 Chronic diastolic (congestive) heart failure: Secondary | ICD-10-CM | POA: Diagnosis not present

## 2021-07-25 DIAGNOSIS — M542 Cervicalgia: Secondary | ICD-10-CM | POA: Diagnosis not present

## 2021-07-25 DIAGNOSIS — A419 Sepsis, unspecified organism: Secondary | ICD-10-CM | POA: Diagnosis not present

## 2021-07-25 DIAGNOSIS — E11628 Type 2 diabetes mellitus with other skin complications: Secondary | ICD-10-CM | POA: Diagnosis not present

## 2021-07-25 DIAGNOSIS — R6520 Severe sepsis without septic shock: Secondary | ICD-10-CM | POA: Diagnosis not present

## 2021-07-25 LAB — CBC
HCT: 41.1 % (ref 39.0–52.0)
Hemoglobin: 14.1 g/dL (ref 13.0–17.0)
MCH: 28.5 pg (ref 26.0–34.0)
MCHC: 34.3 g/dL (ref 30.0–36.0)
MCV: 83 fL (ref 80.0–100.0)
Platelets: 102 10*3/uL — ABNORMAL LOW (ref 150–400)
RBC: 4.95 MIL/uL (ref 4.22–5.81)
RDW: 15.7 % — ABNORMAL HIGH (ref 11.5–15.5)
WBC: 15 10*3/uL — ABNORMAL HIGH (ref 4.0–10.5)
nRBC: 0 % (ref 0.0–0.2)

## 2021-07-25 LAB — BASIC METABOLIC PANEL
Anion gap: 10 (ref 5–15)
BUN: 94 mg/dL — ABNORMAL HIGH (ref 6–20)
CO2: 19 mmol/L — ABNORMAL LOW (ref 22–32)
Calcium: 7.9 mg/dL — ABNORMAL LOW (ref 8.9–10.3)
Chloride: 98 mmol/L (ref 98–111)
Creatinine, Ser: 3.23 mg/dL — ABNORMAL HIGH (ref 0.61–1.24)
GFR, Estimated: 22 mL/min — ABNORMAL LOW (ref >60)
Glucose, Bld: 181 mg/dL — ABNORMAL HIGH (ref 70–99)
Potassium: 3.8 mmol/L (ref 3.5–5.1)
Sodium: 127 mmol/L — ABNORMAL LOW (ref 135–145)

## 2021-07-25 LAB — CULTURE, BLOOD (ROUTINE X 2)
Special Requests: ADEQUATE
Special Requests: ADEQUATE

## 2021-07-25 LAB — GLUCOSE, CAPILLARY
Glucose-Capillary: 101 mg/dL — ABNORMAL HIGH (ref 70–99)
Glucose-Capillary: 135 mg/dL — ABNORMAL HIGH (ref 70–99)
Glucose-Capillary: 124 mg/dL — ABNORMAL HIGH (ref 70–99)
Glucose-Capillary: 106 mg/dL — ABNORMAL HIGH (ref 70–99)

## 2021-07-25 LAB — MAGNESIUM: Magnesium: 2.3 mg/dL (ref 1.7–2.4)

## 2021-07-25 IMAGING — MR MR CERVICAL SPINE W/O CM
4 of 5 series · 24 of 48 positions shown · non-contrast
Comparison: None.

CLINICAL DATA: Back pain and bacteremia

EXAM:
MRI CERVICAL SPINE WITHOUT CONTRAST
TECHNIQUE: Multiplanar, multisequence MR imaging of the cervical spine was
performed. No intravenous contrast was administered.

[Series 3: T2 · sagittal · 3.0mm · 0.43mm/px · 7 of 13 slices shown (1 of 2)]
[im 1/13]
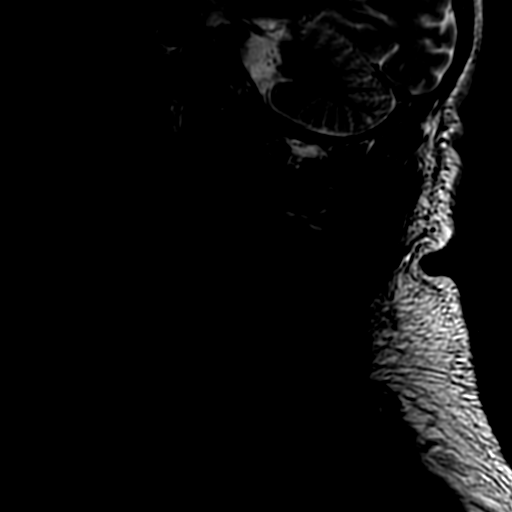
[im 3/13]
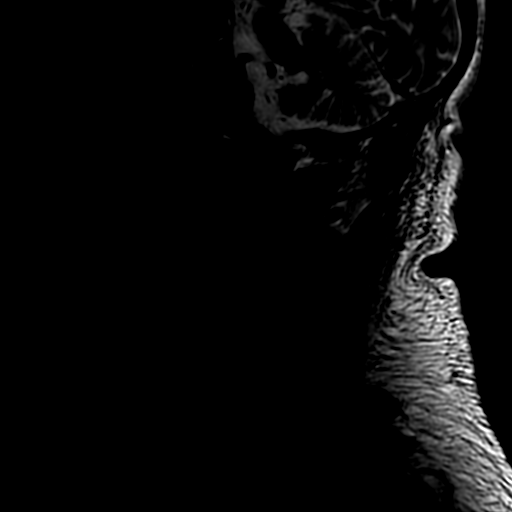
[im 5/13]
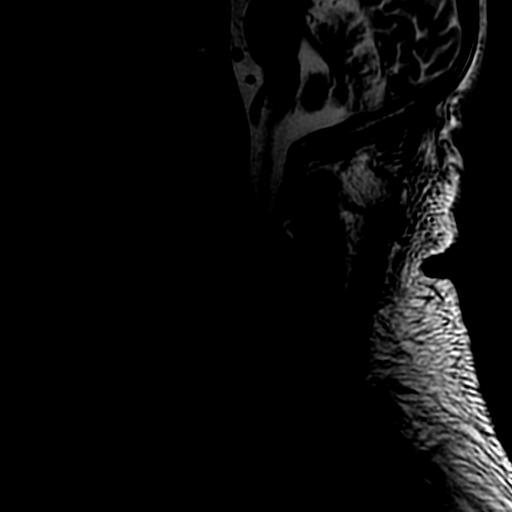
[im 7/13]
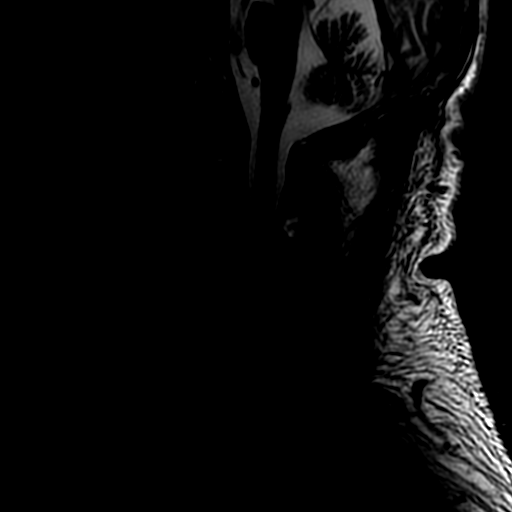
[im 9/13]
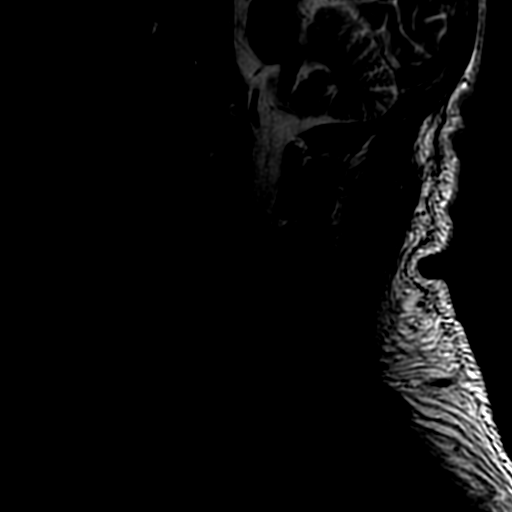
[im 11/13]
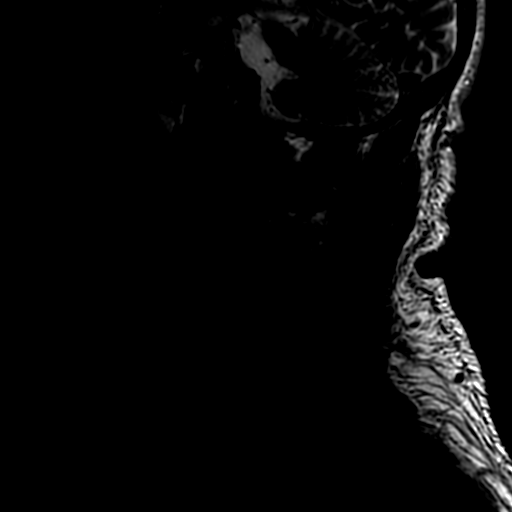
[im 13/13]
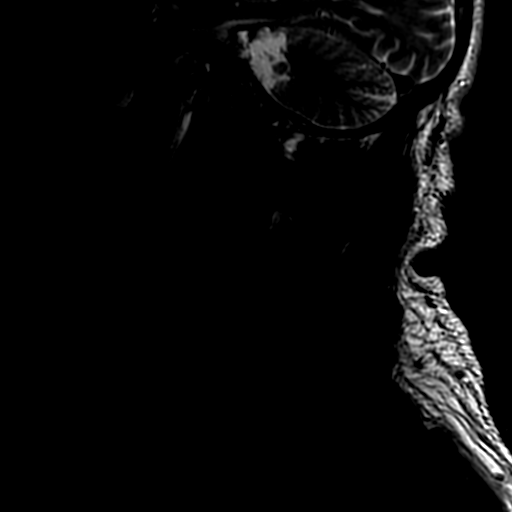

[Series 5: T1 · axial · 3.0mm · 0.70mm/px · z∈[-139,-51]mm · 6 of 29 slices shown (1 of 2)]
[im 1/29]
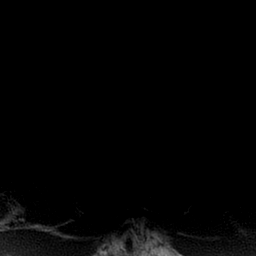
[im 5/29]
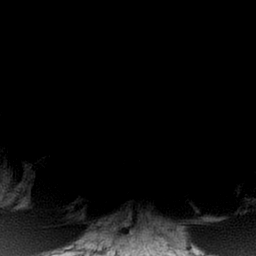
[im 9/29]
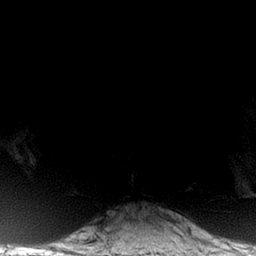
[im 13/29]
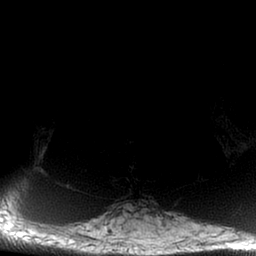
[im 16/29]
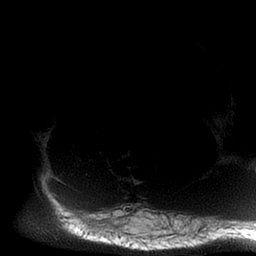
[im 24/29]
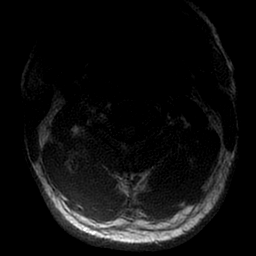

[Series 6: T1 · sagittal · 3.0mm · 0.47mm/px · 3 of 13 slices shown (2 of 2)]
[im 3/13]
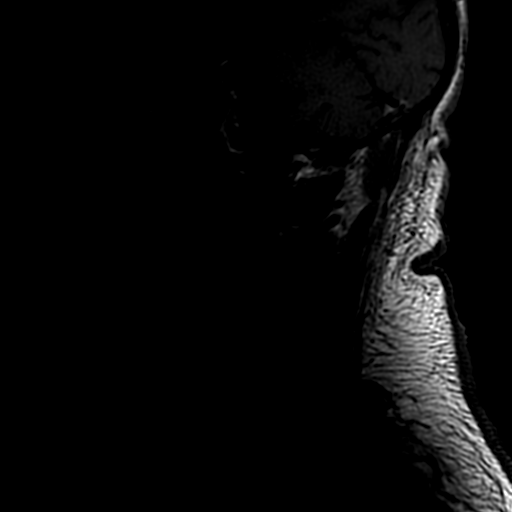
[im 8/13]
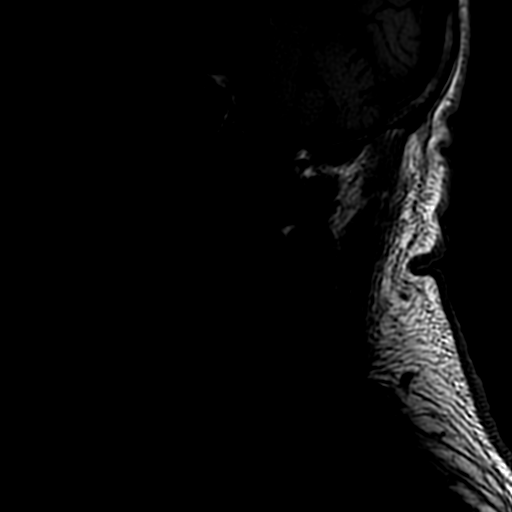
[im 13/13]
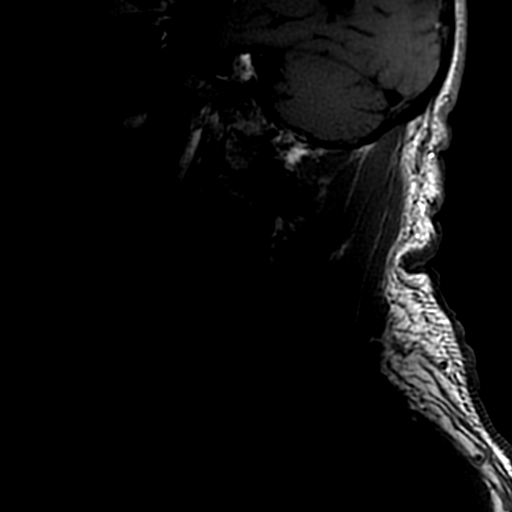

[Series 7: T2 · axial · 3.0mm · 0.35mm/px · z∈[-139,-31]mm · 8 of 29 slices shown (2 of 2)]
[im 1/29]
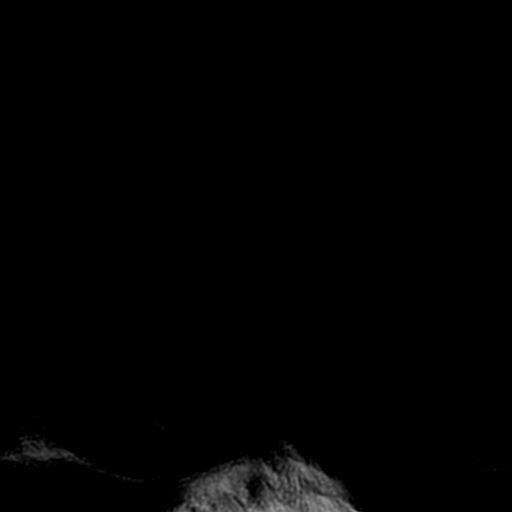
[im 5/29]
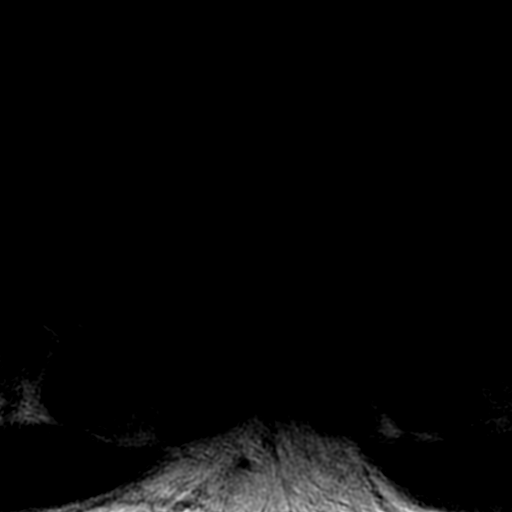
[im 9/29]
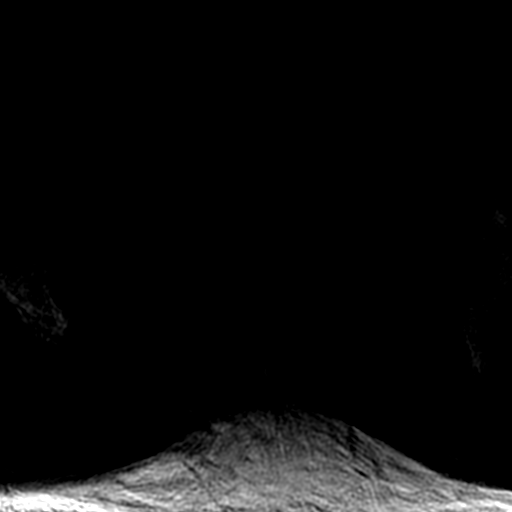
[im 13/29]
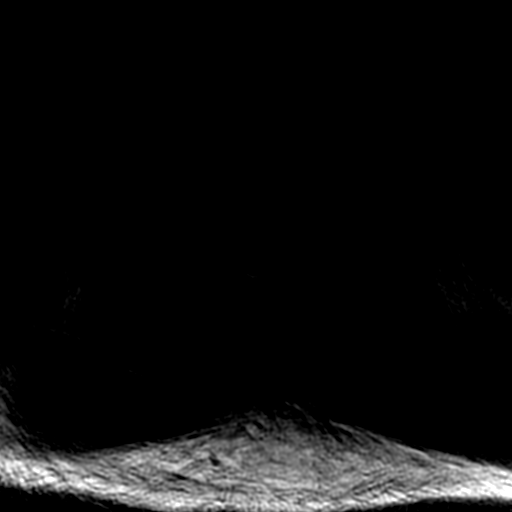
[im 16/29]
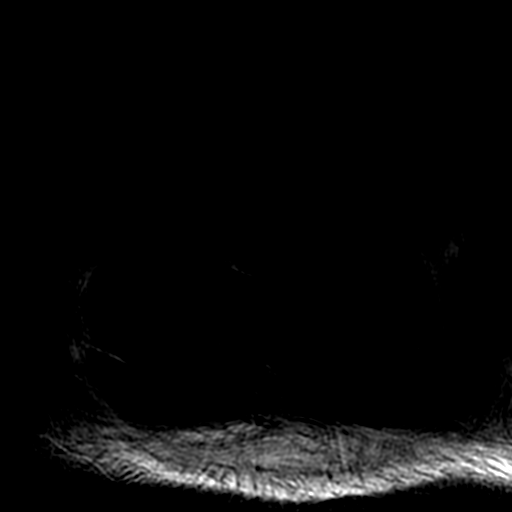
[im 20/29]
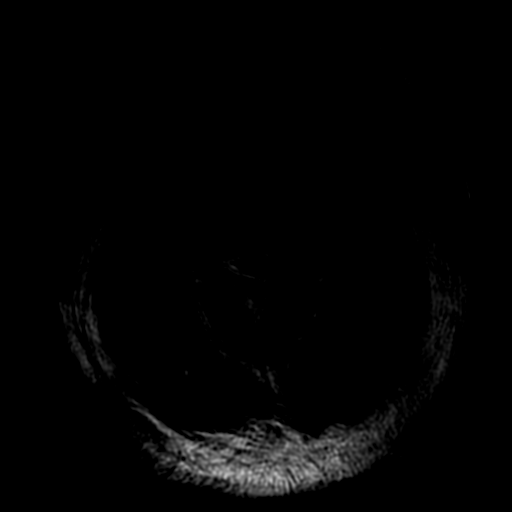
[im 24/29]
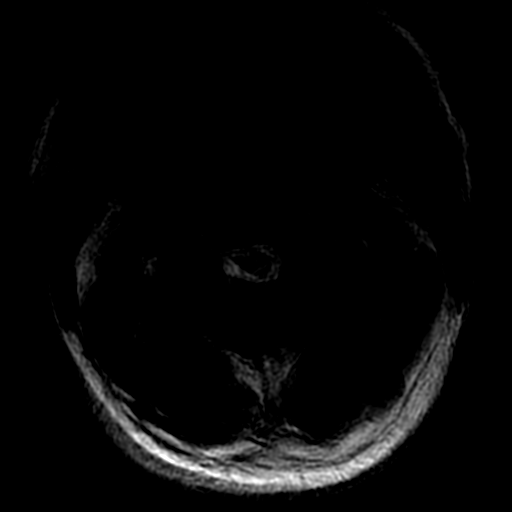
[im 29/29]
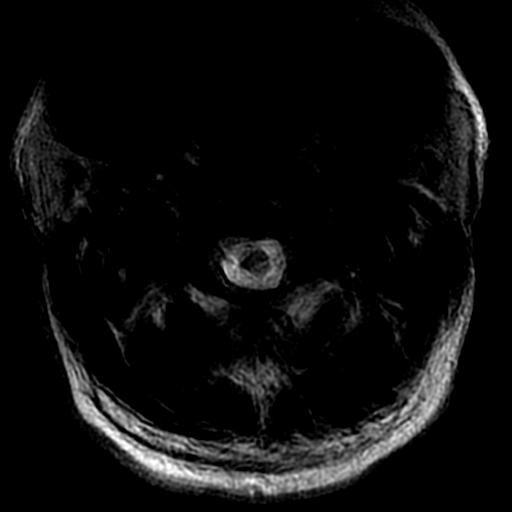

[24 of 48 positions shown; findings below may reference images not displayed]

FINDINGS: The examination is markedly degraded by motion. Alignment:
Physiologic.

Vertebrae: No fracture, evidence of discitis, or bone lesion.

Cord: Normal signal and morphology.

Posterior Fossa, vertebral arteries, paraspinal tissues: Negative.
No epidural collection is visible.

Disc levels:

Limited assessment of the neural foramina due to the degree of
motion. No high-grade spinal canal stenosis.
IMPRESSION: Markedly motion degraded examination. Within that limitation, no
evidence of discitis-osteomyelitis or epidural collection.

## 2021-07-25 MED ORDER — GABAPENTIN 100 MG PO CAPS
100.0000 mg | ORAL_CAPSULE | Freq: Two times a day (BID) | ORAL | Status: DC
  Administered 2021-07-25 – 2021-07-26 (×3): 100 mg via ORAL
  Filled 2021-07-25 (×3): qty 1

## 2021-07-25 MED ORDER — MIDAZOLAM HCL 2 MG/2ML IJ SOLN
2.0000 mg | Freq: Once | INTRAMUSCULAR | Status: AC
  Administered 2021-07-25: 2 mg via INTRAVENOUS

## 2021-07-25 MED ORDER — POLYETHYLENE GLYCOL 3350 17 G PO PACK
17.0000 g | PACK | Freq: Every day | ORAL | Status: DC
  Administered 2021-07-26 – 2021-08-20 (×7): 17 g via ORAL
  Filled 2021-07-25 (×22): qty 1

## 2021-07-25 MED ORDER — ACETAMINOPHEN 325 MG PO TABS
650.0000 mg | ORAL_TABLET | Freq: Four times a day (QID) | ORAL | Status: AC
  Administered 2021-07-25 – 2021-07-27 (×8): 650 mg via ORAL
  Filled 2021-07-25 (×8): qty 2

## 2021-07-25 MED ORDER — AMIODARONE HCL 200 MG PO TABS
200.0000 mg | ORAL_TABLET | Freq: Every day | ORAL | Status: DC
  Administered 2021-07-25 – 2021-07-26 (×2): 200 mg via ORAL
  Filled 2021-07-25 (×2): qty 1

## 2021-07-25 MED ORDER — PENICILLIN G POTASSIUM 20000000 UNITS IJ SOLR
8.0000 10*6.[IU] | Freq: Two times a day (BID) | INTRAVENOUS | Status: DC
  Administered 2021-07-25 – 2021-07-28 (×6): 8 10*6.[IU] via INTRAVENOUS
  Filled 2021-07-25 (×9): qty 8

## 2021-07-25 MED ORDER — HYDROMORPHONE HCL 1 MG/ML IJ SOLN
1.0000 mg | INTRAMUSCULAR | Status: DC | PRN
  Administered 2021-07-25 – 2021-07-31 (×26): 1 mg via INTRAVENOUS
  Filled 2021-07-25 (×27): qty 1

## 2021-07-25 MED ORDER — MIDAZOLAM HCL 2 MG/2ML IJ SOLN
INTRAMUSCULAR | Status: AC
  Filled 2021-07-25: qty 2

## 2021-07-25 MED ORDER — OXYCODONE HCL 5 MG PO TABS
10.0000 mg | ORAL_TABLET | ORAL | Status: DC | PRN
  Administered 2021-07-25 – 2021-09-03 (×143): 10 mg via ORAL
  Filled 2021-07-25 (×146): qty 2

## 2021-07-25 NOTE — Progress Notes (Signed)
Progress Note  Patient Name: [name] Date of Encounter: 07/25/2021  Primary Cardiologist: Sanda Klein, MD   Subjective   Drowsy from pain control. No chest pain.   Inpatient Medications    Scheduled Meds:  acetaminophen  650 mg Oral Q6H   amiodarone  200 mg Oral Daily   apixaban  2.5 mg Oral BID   vitamin C  1,000 mg Oral Daily   Chlorhexidine Gluconate Cloth  6 each Topical Daily   Chlorhexidine Gluconate Cloth  6 each Topical Q0600   docusate sodium  100 mg Oral Daily   gabapentin  100 mg Oral BID   insulin aspart  0-15 Units Subcutaneous TID WC   insulin aspart  0-5 Units Subcutaneous QHS   insulin aspart  3 Units Subcutaneous TID WC   insulin detemir  25 Units Subcutaneous BID   mupirocin ointment  1 application Nasal BID   nutrition supplement (JUVEN)  1 packet Oral BID BM   [START ON 07/26/2021] polyethylene glycol  17 g Oral Daily   zinc sulfate  220 mg Oral Daily   Continuous Infusions:  sodium chloride     lactated ringers Stopped (07/23/21 1013)   magnesium sulfate bolus IVPB     penicillin g continuous IV infusion 8 Million Units (07/25/21 1101)   PRN Meds: acetaminophen, alum & mag hydroxide-simeth, bisacodyl, dextrose, diphenhydrAMINE, guaiFENesin-dextromethorphan, hydrALAZINE, HYDROmorphone (DILAUDID) injection, labetalol, magnesium sulfate bolus IVPB, metoprolol tartrate, ondansetron, oxyCODONE, phenol, polyethylene glycol, potassium chloride   Vital Signs    Vitals:   07/25/21 1315 07/25/21 1330 07/25/21 1334 07/25/21 1539  BP:  (!) 104/55    Pulse:      Resp: 19 16 17    Temp:    98 F (36.7 C)  TempSrc:    Axillary  SpO2: 92% (!) 89% 90%   Weight:      Height:        Intake/Output Summary (Last 24 hours) at 07/25/2021 1709 Last data filed at 07/25/2021 0600 Gross per 24 hour  Intake 1425.74 ml  Output 210 ml  Net 1215.74 ml   Filed Weights   07/22/21 0015 07/23/21 0827  Weight: 108.9 kg 108.9 kg    Telemetry    Atrial  flutter rates 80-90 - Personally Reviewed  ECG    Atrial flutter with variable AV block, rate 128- Personally Reviewed  Physical Exam   GEN: No acute distress.   Neck: No JVD Cardiac: Irregular rhythm, normal rate, no murmurs, rubs, or gallops.  Respiratory: Clear to auscultation bilaterally. GI: Soft, nontender, non-distended  MS: Bilateral amputation Neuro:  Nonfocal  Psych: Normal affect   Labs    Chemistry Recent Labs  Lab 07/21/21 2003 07/21/21 2249 07/24/21 0002 07/24/21 0013 07/24/21 0257 07/25/21 0154  NA 123*   < > 123* 125* 125* 127*  K 4.7   < > 4.9 4.5 4.2 3.8  CL 87*   < > 94*  --  97* 98  CO2 18*   < > 17*  --  17* 19*  GLUCOSE 516*   < > 428*  --  298* 181*  BUN 40*   < > 75*  --  76* 94*  CREATININE 2.76*   < > 3.16*  --  3.29* 3.23*  CALCIUM 8.6*   < > 7.6*  --  7.8* 7.9*  PROT 5.6*  --   --   --   --   --   ALBUMIN 2.0*  --   --   --   --   --  AST 28  --   --   --   --   --   ALT 40  --   --   --   --   --   ALKPHOS 242*  --   --   --   --   --   BILITOT 1.0  --   --   --   --   --   GFRNONAA 26*   < > 22*  --  21* 22*  ANIONGAP 18*   < > 12  --  11 10   < > = values in this interval not displayed.     Hematology Recent Labs  Lab 07/23/21 1719 07/24/21 0013 07/24/21 0257 07/25/21 0154  WBC 13.5*  --  19.0* 15.0*  RBC 5.16  --  5.15 4.95  HGB 14.3 15.0 14.5 14.1  HCT 43.0 44.0 42.6 41.1  MCV 83.3  --  82.7 83.0  MCH 27.7  --  28.2 28.5  MCHC 33.3  --  34.0 34.3  RDW 15.3  --  15.2 15.7*  PLT 83*  --  89* 102*    Cardiac EnzymesNo results for input(s): TROPONINI in the last 168 hours. No results for input(s): TROPIPOC in the last 168 hours.   BNPNo results for input(s): BNP, PROBNP in the last 168 hours.   DDimer No results for input(s): DDIMER in the last 168 hours.   Radiology    DG CHEST PORT 1 VIEW  Result Date: 07/24/2021 CLINICAL DATA:  Sepsis, shortness of breath EXAM: PORTABLE CHEST 1 VIEW COMPARISON:  Chest  radiograph 07/21/2021 FINDINGS: The heart is significantly enlarged, unchanged. The mediastinal contours are stable. There is vascular congestion without definite overt pulmonary edema. There is no focal consolidation. The previously seen left pleural effusion is not appreciated on the current study. There is no pneumothorax. The bones are stable. IMPRESSION: 1. Cardiomegaly with vascular congestion without definite overt pulmonary edema. 2. The previously seen small left pleural effusion is not seen on the current study. Electronically Signed   By: Valetta Mole M.D.   On: 07/24/2021 12:11    Cardiac Studies  As above  Patient Profile     55 y.o. male with a hx of HFpEF (EF = 50%), HTN, DM2, CKD 3, osteomyelitis s/p R BKA who is being seen 07/22/2021 for the evaluation of sepsis secondary to osteomyelitis   Assessment & Plan   Principal Problem:   Severe sepsis with acute organ dysfunction [facility]) Active Problems:   Essential hypertension   Stage 3a chronic kidney disease (HCC)   Type 2 diabetes mellitus with diabetic polyneuropathy, with long-term current use of insulin (HCC)   Right below-knee amputee ([place])   Diabetic infection of left foot (HCC)   Chronic diastolic (congestive) heart failure (HCC)   Chest pain   AKI (acute kidney injury) ([place])   Gas gangrene of foot ([place])   Atrial flutter ([place])   Septic shock ([place])   Echo shows newly reduced EF. May be tachycardia mediated. Phenylephrine was just discontinued, can consider GDMT for HF starting tomorrow, would add low dose metoprolol succinate to start. Renal function prohibitive for ARB/MRA/SGLT2I.  ID requesting TEE for endocarditis, will plan for TEE +/- cardioversion, arranged for 07/28/21. This may need to occur at the bedside if patient remains hypotensive.   Patient has good understanding that aflutter is driven by sepsis, and may improve with convalescence and source control.  Amiodarone transitioned to oral, 200 mg po daily.  Ok to continue as such, though may want to increase to 200 mg po BID for loading purposes.   Ok to start anticoagulation, with CHADS2VASC of at least 2 (HTN, DM). Eliquis 2.5 mg BID for renal dosing.       For questions or updates, please contact [place] Please consult [email or web address] for contact info under        Signed, Elouise Munroe, MD

## 2021-07-25 NOTE — Progress Notes (Signed)
NAME:  [name], MRN:  [id number], DOB:  1965-12-03, LOS: 3 ADMISSION DATE:  07/21/2021, CONSULTATION DATE:  07/22/2021 REFERRING MD:  Broadus [name], CHIEF COMPLAINT:  Septic shock   History of Present Illness:  55 y.o. M who presented 11/14 with concern of left leg infection. He states that about 5 days ago he stepped on an unknown object which he feels caused a wound to the area of previous L great toe amputation site resulting in an ulcer that over the last 3 days has become more purulent, erythematous, drainage, and with worsened pain and swelling. He has had shortness of breath, fatigue, and chills. He has been receiving fluid resuscitation to this point without appropriate hemodynamic response. PCCM was consulted for transfer and pressor support. Transferred to ICU, to OR 11/16 for left transtibial amputation.   Pertinent  Medical History  Poorly controlled type 2 diabetes mellitus, diabetic foot infection with prior right BKA, chronic diastolic CHF, hypertension, dyslipidemia  Significant Hospital Events: Including procedures, antibiotic start and stop dates in addition to other pertinent events   11/14 Admitted with DKA, sepsis secondary to toe infection  11/16 S/p amputation by Dr. Sharol Given.   Interim History / Subjective:  Patient remains afebrile Still requiring low-dose IV vasopressors Complaining of severe back pain, different from before  Objective   Blood pressure (!) 94/54, pulse 72, temperature 98.3 F (36.8 C), temperature source Axillary, resp. rate (!) 25, height 6\' 3"  (1.905 m), weight 108.9 kg, SpO2 91 %.        Intake/Output Summary (Last 24 hours) at 07/25/2021 1024 Last data filed at 07/25/2021 0600 Gross per 24 hour  Intake 2870.4 ml  Output 210 ml  Net 2660.4 ml   Filed Weights   07/22/21 0015 07/23/21 0827  Weight: 108.9 kg 108.9 kg    Exam: General: Acute on chronically ill looking adult male lying in bed, in distress due to pain HEENT: MM pink/moist,  no jvd, anicteric Neuro: AAOx4, speech clear, MAE normal strength.  CV: Irregularly irregular, AF on monitor, no m/r/g PULM: non-labored at rest, lungs bilaterally clear GI: soft, bsx4 active Extremities: warm/dry, BUE dependent edema.  LLE with post BKA amputation brace in place, RLE BKA.  Skin: no rashes or lesions  Resolved Hospital Problem list   Hypomagnesemia  Assessment & Plan:   Septic Shock in setting of Strep Agalactiae Bacteremia due to Diabetic Foot Infection  Still requiring low-dose vasopressor with phenylephrine Continue ceftriaxone per ID, he will require antibiotic for longer duration Repeat blood cultures are still positive Titrate vasopressors with map goal 65  Diabetic Foot Infection s/p Left Transtibial Amputation  Amputation 11/16 in setting of non-salvagable LLE Post operative & wound care per Dr. Sharol Given  Poor insight into DM control and end organ effects  Wound VAC per ortho, f/u as outpatient   AKI on CKD IV Acute metabolic acidosis due to AKI Hyponatremia  Hypocalcemia  Corrected Na 128 Closely monitor urine output and BMP Continue aggressive electrolyte supplements Avoid nephrotoxic agents, ensure adequate renal perfusion Serum bicarbonate is improving  Persistent Atrial Flutter  Chronic diastolic CHF  CHADsVASC 2 Continue telemetry monitoring Stop IV amiodarone Started on 200 mg p.o. amiodarone Continue apixaban for stroke prophylaxis Appreciate Cardiology input He will likely will need TEE, Cardiology considering for cardioversion anyway  Poorly controlled diabetes with hyperglycemia Patient's fingersticks are better controlled now Insulin infusion was stopped yesterday Continue levemir 25 units BID, moderate SSI (renal failure) with meals and QHS   Discitis vs  Osteomyelitis Patient is complaining of severe back pain MRI with T6,7,8 bone marrow edema, possible discitis / osteomyelitis. No epidural abscess.    Will get MRI of cervical  spine Currently on IV antibiotics  Best Practice (right click and "Reselect all SmartList Selections" daily)  Diet/type: Carb modified DVT prophylaxis: apixiban GI prophylaxis: N/A Lines: N/A Foley:  N/A Code Status:  full code Last date of multidisciplinary goals of care discussion: full code   Critical Care Time:    Total critical care time: 34 minutes  Performed by: [place] care time was exclusive of separately billable procedures and treating other patients.   Critical care was necessary to treat or prevent imminent or life-threatening deterioration.   Critical care was time spent personally by me on the following activities: development of treatment plan with patient and/or surrogate as well as nursing, discussions with consultants, evaluation of patient's response to treatment, examination of patient, obtaining history from patient or surrogate, ordering and performing treatments and interventions, ordering and review of laboratory studies, ordering and review of radiographic studies, pulse oximetry and re-evaluation of patient's condition.   Jacky Kindle MD [facility] See Amion for pager If no response to pager, please call [phone] until 7pm After 7pm, Please call E-link [phone]

## 2021-07-25 NOTE — Progress Notes (Signed)
Pt refuses cpap

## 2021-07-25 NOTE — Progress Notes (Signed)
[facility] for Infectious Disease  Date of Admission:  07/21/2021     Total days of antibiotics 5         ASSESSMENT:  Mr. Ohms continues to have left sided upper back pain. In the setting of bacteremia it would be prudent to obtain imaging to rule out additional infection. MRI of the cervical spine ordered. Antibiotics narrowed to Penicillin G.  Repeat blood cultures without growth to date and will monitor until finalized. Pain management remains a challenge as he does have a history of substance use. Post-surgical wound care per orthopedics. Pain management and remaining medical care per primary team.   PLAN:  Continue current dose of Penicillin.  Wound care per Orthopedics.  Monitor cultures for clearance of bacteremia.  Likely to need TEE MRI cervical spine to check for any new infection.  Remaining medical and supportive care per primary team.    Principal Problem:   Severe sepsis with acute organ dysfunction [facility]) Active Problems:   Essential hypertension   Stage 3a chronic kidney disease ([place])   Type 2 diabetes mellitus with diabetic polyneuropathy, with long-term current use of insulin (HCC)   Right below-knee amputee (HCC)   Diabetic infection of left foot (HCC)   Chronic diastolic (congestive) heart failure (HCC)   Chest pain   AKI (acute kidney injury) ([place])   Gas gangrene of foot (HCC)   Atrial flutter (HCC)   Septic shock (HCC)    amiodarone  200 mg Oral Daily   apixaban  2.5 mg Oral BID   vitamin C  1,000 mg Oral Daily   Chlorhexidine Gluconate Cloth  6 each Topical Daily   Chlorhexidine Gluconate Cloth  6 each Topical Q0600   docusate sodium  100 mg Oral Daily   gabapentin  100 mg Oral BID   insulin aspart  0-15 Units Subcutaneous TID WC   insulin aspart  0-5 Units Subcutaneous QHS   insulin aspart  3 Units Subcutaneous TID WC   insulin detemir  25 Units Subcutaneous BID   mupirocin ointment  1 application Nasal BID   nutrition supplement  (JUVEN)  1 packet Oral BID BM   zinc sulfate  220 mg Oral Daily    SUBJECTIVE:  Afebrile overnight with no acute events. Continues to have left sided upper back pain and feeling hot at times. Son is visiting at beside.   Allergies  Allergen Reactions   Bactrim [Sulfamethoxazole-Trimethoprim]    Ceprotin [Protein C Concentrate (Human)]    Ciprofloxacin Other (See Comments)    Kidney function   Levaquin [Levofloxacin]      Review of Systems: Review of Systems  Constitutional:  Negative for chills, fever and weight loss.  Respiratory:  Negative for cough, shortness of breath and wheezing.   Cardiovascular:  Negative for chest pain and leg swelling.  Gastrointestinal:  Negative for abdominal pain, constipation, diarrhea, nausea and vomiting.  Musculoskeletal:  Positive for back pain.  Skin:  Negative for rash.     OBJECTIVE: Vitals:   07/25/21 1023 07/25/21 1030 07/25/21 1045 07/25/21 1052  BP:  (!) 89/63 (!) 89/62   Pulse: 72  (!) 45 68  Resp: (!) 25  (!) 29 (!) 29  Temp:      TempSrc:      SpO2: 91%     Weight:      Height:       Body mass index is 30 kg/m.  Physical Exam Constitutional:      General: He  is not in acute distress.    Appearance: He is well-developed.     Comments: Lying in bed with head of bed elevated  Cardiovascular:     Rate and Rhythm: Normal rate and regular rhythm.     Heart sounds: Normal heart sounds.  Pulmonary:     Effort: Pulmonary effort is normal.     Breath sounds: Normal breath sounds.  Musculoskeletal:     Comments: Wound VAC with no drainage  Skin:    General: Skin is warm and dry.  Neurological:     Mental Status: He is alert and oriented to person, place, and time.  Psychiatric:        Mood and Affect: Mood normal.    Lab Results Lab Results  Component Value Date   WBC 15.0 (H) 07/25/2021   HGB 14.1 07/25/2021   HCT 41.1 07/25/2021   MCV 83.0 07/25/2021   PLT 102 (L) 07/25/2021    Lab Results  Component Value  Date   CREATININE 3.23 (H) 07/25/2021   BUN 94 (H) 07/25/2021   NA 127 (L) 07/25/2021   K 3.8 07/25/2021   CL 98 07/25/2021   CO2 19 (L) 07/25/2021    Lab Results  Component Value Date   ALT 40 07/21/2021   AST 28 07/21/2021   ALKPHOS 242 (H) 07/21/2021   BILITOT 1.0 07/21/2021     Microbiology: Recent Results (from the past 240 hour(s))  Resp Panel by RT-PCR (Flu A&B, Covid) Nasopharyngeal Swab     Status: None   Collection Time: 07/21/21  7:54 PM   Specimen: Nasopharyngeal Swab; Nasopharyngeal(NP) swabs in vial transport medium  Result Value Ref Range Status   SARS Coronavirus 2 by RT PCR NEGATIVE NEGATIVE Final    Comment: (NOTE) SARS-CoV-2 target nucleic acids are NOT DETECTED.  The SARS-CoV-2 RNA is generally detectable in upper respiratory specimens during the acute phase of infection. The lowest concentration of SARS-CoV-2 viral copies this assay can detect is 138 copies/mL. A negative result does not preclude SARS-Cov-2 infection and should not be used as the sole basis for treatment or other patient management decisions. A negative result may occur with  improper specimen collection/handling, submission of specimen other than nasopharyngeal swab, presence of viral mutation(s) within the areas targeted by this assay, and inadequate number of viral copies(<138 copies/mL). A negative result must be combined with clinical observations, patient history, and epidemiological information. The expected result is Negative.  Fact Sheet for Patients:  EntrepreneurPulse.com.au  Fact Sheet for Healthcare Providers:  IncredibleEmployment.be  This test is no t yet approved or cleared by the [place] FDA and  has been authorized for detection and/or diagnosis of SARS-CoV-2 by FDA under an Emergency Use Authorization (EUA). This EUA will remain  in effect (meaning this test can be used) for the duration of the COVID-19 declaration under  Section 564(b)(1) of the Act, 21 U.S.C.section 360bbb-3(b)(1), unless the authorization is terminated  or revoked sooner.       Influenza A by PCR NEGATIVE NEGATIVE Final   Influenza B by PCR NEGATIVE NEGATIVE Final    Comment: (NOTE) The Xpert Xpress SARS-CoV-2/FLU/RSV plus assay is intended as an aid in the diagnosis of influenza from Nasopharyngeal swab specimens and should not be used as a sole basis for treatment. Nasal washings and aspirates are unacceptable for Xpert Xpress SARS-CoV-2/FLU/RSV testing.  Fact Sheet for Patients: EntrepreneurPulse.com.au  Fact Sheet for Healthcare Providers: IncredibleEmployment.be  This test is not yet approved or cleared by  the [employer] and has been authorized for detection and/or diagnosis of SARS-CoV-2 by FDA under an Emergency Use Authorization (EUA). This EUA will remain in effect (meaning this test can be used) for the duration of the COVID-19 declaration under Section 564(b)(1) of the Act, 21 U.S.C. section 360bbb-3(b)(1), unless the authorization is terminated or revoked.  Performed at [facility], [place] [address], [place], [place] [address]   Culture, blood (Routine x 2)     Status: Abnormal (Preliminary result)   Collection Time: 07/21/21  8:00 PM   Specimen: BLOOD  Result Value Ref Range Status   Specimen Description BLOOD RIGHT ANTECUBITAL  Final   Special Requests   Final    BOTTLES DRAWN AEROBIC AND ANAEROBIC Blood Culture adequate volume   Culture  Setup Time   Final    GRAM POSITIVE COCCI IN CHAINS IN BOTH AEROBIC AND ANAEROBIC BOTTLES Performed at [facility], [place] [address], [place], [place] [address]    Culture GROUP B STREP(S.AGALACTIAE)ISOLATED (A)  Final   Report Status PENDING  Incomplete  Culture, blood (Routine x 2)     Status: Abnormal (Preliminary result)   Collection Time: 07/21/21  8:03 PM   Specimen: BLOOD  Result Value Ref Range Status    Specimen Description BLOOD LEFT ARM  Final   Special Requests   Final    BOTTLES DRAWN AEROBIC AND ANAEROBIC Blood Culture adequate volume   Culture  Setup Time   Final    GRAM POSITIVE COCCI IN CHAINS IN BOTH AEROBIC AND ANAEROBIC BOTTLES CRITICAL RESULT CALLED TO, READ BACK BY AND VERIFIED WITH: PHARMD B BOLDEN [id number] AT 66 AM BY CM    Culture (A)  Final    GROUP B STREP(S.AGALACTIAE)ISOLATED SUSCEPTIBILITIES TO FOLLOW REPEATING Performed at [facility], [place] [address], [place], [place] [address]    Report Status PENDING  Incomplete  Blood Culture ID Panel (Reflexed)     Status: Abnormal   Collection Time: 07/21/21  8:03 PM  Result Value Ref Range Status   Enterococcus faecalis NOT DETECTED NOT DETECTED Final   Enterococcus Faecium NOT DETECTED NOT DETECTED Final   Listeria monocytogenes NOT DETECTED NOT DETECTED Final   Staphylococcus species NOT DETECTED NOT DETECTED Final   Staphylococcus aureus (BCID) NOT DETECTED NOT DETECTED Final   Staphylococcus epidermidis NOT DETECTED NOT DETECTED Final   Staphylococcus lugdunensis NOT DETECTED NOT DETECTED Final   Streptococcus species DETECTED (A) NOT DETECTED Final    Comment: CRITICAL RESULT CALLED TO, READ BACK BY AND VERIFIED WITH: PHARMD B BOLDEN [id number] AT 923 AM BY CM    Streptococcus agalactiae DETECTED (A) NOT DETECTED Final    Comment: CRITICAL RESULT CALLED TO, READ BACK BY AND VERIFIED WITH: PHARMD B BOLDEN [id number] AT 923 AM BY CM    Streptococcus pneumoniae NOT DETECTED NOT DETECTED Final   Streptococcus pyogenes NOT DETECTED NOT DETECTED Final   A.calcoaceticus-baumannii NOT DETECTED NOT DETECTED Final   Bacteroides fragilis NOT DETECTED NOT DETECTED Final   Enterobacterales NOT DETECTED NOT DETECTED Final   Enterobacter cloacae complex NOT DETECTED NOT DETECTED Final   Escherichia coli NOT DETECTED NOT DETECTED Final   Klebsiella aerogenes NOT DETECTED NOT DETECTED Final   Klebsiella oxytoca NOT DETECTED NOT  DETECTED Final   Klebsiella pneumoniae NOT DETECTED NOT DETECTED Final   Proteus species NOT DETECTED NOT DETECTED Final   Salmonella species NOT DETECTED NOT DETECTED Final   Serratia marcescens NOT DETECTED NOT DETECTED Final  Haemophilus influenzae NOT DETECTED NOT DETECTED Final   Neisseria meningitidis NOT DETECTED NOT DETECTED Final   Pseudomonas aeruginosa NOT DETECTED NOT DETECTED Final   Stenotrophomonas maltophilia NOT DETECTED NOT DETECTED Final   Candida albicans NOT DETECTED NOT DETECTED Final   Candida auris NOT DETECTED NOT DETECTED Final   Candida glabrata NOT DETECTED NOT DETECTED Final   Candida krusei NOT DETECTED NOT DETECTED Final   Candida parapsilosis NOT DETECTED NOT DETECTED Final   Candida tropicalis NOT DETECTED NOT DETECTED Final   Cryptococcus neoformans/gattii NOT DETECTED NOT DETECTED Final    Comment: Performed at [facility], [place] [address] [place], [place] [address]  Urine Culture     Status: Abnormal   Collection Time: 07/22/21  3:26 AM   Specimen: In/Out Cath Urine  Result Value Ref Range Status   Specimen Description IN/OUT CATH URINE  Final   Special Requests NONE  Final   Culture (A)  Final    30,000 COLONIES/mL STREPTOCOCCUS AGALACTIAE TESTING AGAINST S. AGALACTIAE NOT ROUTINELY PERFORMED DUE TO PREDICTABILITY OF AMP/PEN/VAN SUSCEPTIBILITY. Performed at [facility], [place] [address], [place], [place] [address]    Report Status 07/23/2021 FINAL  Final  MRSA Next Gen by PCR, Nasal     Status: Abnormal   Collection Time: 07/22/21  5:02 PM   Specimen: Nasal Mucosa; Nasal Swab  Result Value Ref Range Status   MRSA by PCR Next Gen DETECTED (A) NOT DETECTED Final    Comment: RESULT CALLED TO, READ BACK BY AND VERIFIED WITH: ASHLEY ORE RN 07/22/2021 @2051  BY JW (NOTE) The GeneXpert MRSA Assay (FDA approved for NASAL specimens only), is one component of a comprehensive MRSA colonization surveillance program. It is not intended to  diagnose MRSA infection nor to guide or monitor treatment for MRSA infections. Test performance is not FDA approved in patients less than 22 years old. Performed at [facility], [place] [address] [place], [place] [address]   Surgical pcr screen     Status: Abnormal   Collection Time: 07/22/21  8:53 PM   Specimen: Nasal Mucosa; Nasal Swab  Result Value Ref Range Status   MRSA, PCR POSITIVE (A) NEGATIVE Final    Comment: RESULT CALLED TO, READ BACK BY AND VERIFIED WITH: A ORR,RN@2230  07/22/21 [place]    Staphylococcus aureus POSITIVE (A) NEGATIVE Final    Comment: (NOTE) The Xpert SA Assay (FDA approved for NASAL specimens in patients 56 years of age and older), is one component of a comprehensive surveillance program. It is not intended to diagnose infection nor to guide or monitor treatment. Performed at [facility], [place] [address] [place], [place] [address]      Terri Piedra, [facility]  07/25/2021  11:06 AM

## 2021-07-25 NOTE — Progress Notes (Signed)
Pt refused CPAP tonight. Pt states it gives him to much anxiety and he would rather wear .

## 2021-07-25 NOTE — Progress Notes (Signed)
Pt transported to and from MRI without complications. Patient in extreme pain, screaming, writhing, unable to remain still for MRI despite PRN medications.   Upon arrival back to room, patient unable to tolerate pain. Screaming, Tachypnea, tachycardia, patient bearing down to the point of color change. Inconsolable. Patient support person, Nunzio Cory called. PRN IV dilaudid given, pt fell asleep during conversation.   Updated kathleen on patient status, plan of care and all questions answered at this time.

## 2021-07-25 NOTE — Progress Notes (Addendum)
Progress Note  Patient Name: [name] Date of Encounter: 07/25/2021  Primary Cardiologist: Sanda Klein, MD   Subjective   Having significant pain, seems quite uncomfortable.   Inpatient Medications    Scheduled Meds:  amiodarone  200 mg Oral Daily   apixaban  2.5 mg Oral BID   vitamin C  1,000 mg Oral Daily   Chlorhexidine Gluconate Cloth  6 each Topical Daily   Chlorhexidine Gluconate Cloth  6 each Topical Q0600   docusate sodium  100 mg Oral Daily   gabapentin  100 mg Oral BID   insulin aspart  0-15 Units Subcutaneous TID WC   insulin aspart  0-5 Units Subcutaneous QHS   insulin aspart  3 Units Subcutaneous TID WC   insulin detemir  25 Units Subcutaneous BID   mupirocin ointment  1 application Nasal BID   nutrition supplement (JUVEN)  1 packet Oral BID BM   zinc sulfate  220 mg Oral Daily   Continuous Infusions:  sodium chloride     lactated ringers Stopped (07/23/21 1013)   magnesium sulfate bolus IVPB     penicillin g continuous IV infusion     PRN Meds: acetaminophen, alum & mag hydroxide-simeth, bisacodyl, dextrose, diphenhydrAMINE, guaiFENesin-dextromethorphan, hydrALAZINE, HYDROmorphone (DILAUDID) injection, labetalol, magnesium sulfate bolus IVPB, metoprolol tartrate, ondansetron, oxyCODONE, phenol, polyethylene glycol, potassium chloride   Vital Signs    Vitals:   07/25/21 0745 07/25/21 0800 07/25/21 0802 07/25/21 0815  BP:   (!) 111/53   Pulse: 64 62  90  Resp: (!) 34 (!) 22  15  Temp:      TempSrc:      SpO2: 95% 95%  95%  Weight:      Height:        Intake/Output Summary (Last 24 hours) at 07/25/2021 0935 Last data filed at 07/25/2021 0600 Gross per 24 hour  Intake 2870.4 ml  Output 210 ml  Net 2660.4 ml   Filed Weights   07/22/21 0015 07/23/21 0827  Weight: 108.9 kg 108.9 kg    Telemetry    Atrial flutter rates 80-90 - Personally Reviewed  ECG    Atrial flutter with variable AV block, rate 128- Personally  Reviewed  Physical Exam   GEN: No acute distress.   Neck: No JVD Cardiac: Irregular rhythm, normal rate, no murmurs, rubs, or gallops.  Respiratory: Clear to auscultation bilaterally. GI: Soft, nontender, non-distended  MS: Bilateral amputation Neuro:  Nonfocal  Psych: Normal affect   Labs    Chemistry Recent Labs  Lab 07/21/21 2003 07/21/21 2249 07/24/21 0002 07/24/21 0013 07/24/21 0257 07/25/21 0154  NA 123*   < > 123* 125* 125* 127*  K 4.7   < > 4.9 4.5 4.2 3.8  CL 87*   < > 94*  --  97* 98  CO2 18*   < > 17*  --  17* 19*  GLUCOSE 516*   < > 428*  --  298* 181*  BUN 40*   < > 75*  --  76* 94*  CREATININE 2.76*   < > 3.16*  --  3.29* 3.23*  CALCIUM 8.6*   < > 7.6*  --  7.8* 7.9*  PROT 5.6*  --   --   --   --   --   ALBUMIN 2.0*  --   --   --   --   --   AST 28  --   --   --   --   --  ALT 40  --   --   --   --   --   ALKPHOS 242*  --   --   --   --   --   BILITOT 1.0  --   --   --   --   --   GFRNONAA 26*   < > 22*  --  21* 22*  ANIONGAP 18*   < > 12  --  11 10   < > = values in this interval not displayed.     Hematology Recent Labs  Lab 07/23/21 1719 07/24/21 0013 07/24/21 0257 07/25/21 0154  WBC 13.5*  --  19.0* 15.0*  RBC 5.16  --  5.15 4.95  HGB 14.3 15.0 14.5 14.1  HCT 43.0 44.0 42.6 41.1  MCV 83.3  --  82.7 83.0  MCH 27.7  --  28.2 28.5  MCHC 33.3  --  34.0 34.3  RDW 15.3  --  15.2 15.7*  PLT 83*  --  89* 102*    Cardiac EnzymesNo results for input(s): TROPONINI in the last 168 hours. No results for input(s): TROPIPOC in the last 168 hours.   BNPNo results for input(s): BNP, PROBNP in the last 168 hours.   DDimer No results for input(s): DDIMER in the last 168 hours.   Radiology    DG CHEST PORT 1 VIEW  Result Date: 07/24/2021 CLINICAL DATA:  Sepsis, shortness of breath EXAM: PORTABLE CHEST 1 VIEW COMPARISON:  Chest radiograph 07/21/2021 FINDINGS: The heart is significantly enlarged, unchanged. The mediastinal contours are stable.  There is vascular congestion without definite overt pulmonary edema. There is no focal consolidation. The previously seen left pleural effusion is not appreciated on the current study. There is no pneumothorax. The bones are stable. IMPRESSION: 1. Cardiomegaly with vascular congestion without definite overt pulmonary edema. 2. The previously seen small left pleural effusion is not seen on the current study. Electronically Signed   By: Valetta Mole M.D.   On: 07/24/2021 12:11   ECHOCARDIOGRAM LIMITED  Result Date: 07/23/2021    ECHOCARDIOGRAM LIMITED REPORT   Patient Name:   [name] Date of Exam: 07/23/2021 Medical Rec #:  [id number]      Height:       75.0 in Accession #:    [id number]     Weight:       240.0 lb Date of Birth:  Oct 28, 1965      BSA:          2.371 m Patient Age:    82 years       BP:           98/69 mmHg Patient Gender: M              HR:           83 bpm. Exam Location:  Inpatient Procedure: 3D Echo, Cardiac Doppler, Color Doppler, Intracardiac Opacification            Agent and Limited Echo Indications:    Bacteremia  History:        Patient has prior history of Echocardiogram examinations, most                 recent 05/01/2021. CHF, Abnormal ECG, Arrythmias:Atrial                 Fibrillation and Atrial Flutter, Signs/Symptoms:Bacteremia; Risk                 Factors:Hypertension, Diabetes and Dyslipidemia.  Sonographer:    Roseanna Rainbow RDCS Referring Phys: [id number] [name]  1. Left ventricular ejection fraction, by estimation, is 35%. The left ventricle has moderately decreased function. The left ventricle demonstrates global hypokinesis. There is mild left ventricular hypertrophy. Left ventricular diastolic parameters are  indeterminate.  2. Right ventricular systolic function is moderately reduced. The right ventricular size is normal. There is moderately elevated pulmonary artery systolic pressure. The estimated right ventricular systolic pressure is 54.0 mmHg.  3.  Left atrial size was mildly dilated.  4. Right atrial size was mildly dilated.  5. The mitral valve is normal in structure. Trivial mitral valve regurgitation. No evidence of mitral stenosis.  6. The aortic valve is tricuspid. Aortic valve regurgitation is not visualized. No aortic stenosis is present.  7. Aortic dilatation noted. There is mild dilatation of the aortic root, measuring 39 mm.  8. The inferior vena cava is dilated in size with <50% respiratory variability, suggesting right atrial pressure of 15 mmHg.  9. No vegetation visualized, but if high suspicion would need TEE. 10. The patient was in atrial fibrillation. FINDINGS  Left Ventricle: Left ventricular ejection fraction, by estimation, is 35%. The left ventricle has moderately decreased function. The left ventricle demonstrates global hypokinesis. Definity contrast agent was given IV to delineate the left ventricular endocardial borders. The left ventricular internal cavity size was normal in size. There is mild left ventricular hypertrophy. Left ventricular diastolic parameters are indeterminate. Right Ventricle: The right ventricular size is normal. Right ventricular systolic function is moderately reduced. There is moderately elevated pulmonary artery systolic pressure. The tricuspid regurgitant velocity is 3.08 m/s, and with an assumed right atrial pressure of 15 mmHg, the estimated right ventricular systolic pressure is 08.6 mmHg. Left Atrium: Left atrial size was mildly dilated. Right Atrium: Right atrial size was mildly dilated. Pericardium: Trivial pericardial effusion is present. Mitral Valve: The mitral valve is normal in structure. There is mild thickening of the mitral valve leaflet(s). There is mild calcification of the mitral valve leaflet(s). Trivial mitral valve regurgitation. No evidence of mitral valve stenosis. Tricuspid Valve: The tricuspid valve is normal in structure. Tricuspid valve regurgitation is mild. Aortic Valve: The  aortic valve is tricuspid. Aortic valve regurgitation is not visualized. No aortic stenosis is present. Pulmonic Valve: The pulmonic valve was normal in structure. Pulmonic valve regurgitation is not visualized. Aorta: Aortic dilatation noted. There is mild dilatation of the aortic root, measuring 39 mm. Venous: The inferior vena cava is dilated in size with less than 50% respiratory variability, suggesting right atrial pressure of 15 mmHg. LEFT VENTRICLE PLAX 2D LVIDd:         5.10 cm LVIDs:         4.40 cm LV PW:         1.50 cm LV IVS:        1.40 cm LVOT diam:     2.20 cm LVOT Area:     3.80 cm  IVC IVC diam: 2.90 cm LEFT ATRIUM         Index LA diam:    4.50 cm 1.90 cm/m   AORTA Ao Root diam: 3.60 cm Ao Asc diam:  3.90 cm TRICUSPID VALVE TR Peak grad:   37.9 mmHg TR Vmax:        308.00 cm/s  SHUNTS Systemic Diam: 2.20 cm Dalton McleanMD Electronically signed by Franki Monte Signature Date/Time: 07/23/2021/5:54:48 PM    Final     Cardiac Studies  As above  Patient Profile     55 y.o. male with a hx of HFpEF (EF = 50%), HTN, DM2, CKD 3, osteomyelitis s/p R BKA who is being seen 07/22/2021 for the evaluation of sepsis secondary to osteomyelitis   Assessment & Plan   Principal Problem:   Severe sepsis with acute organ dysfunction [facility] Active Problems:   Essential hypertension   Stage 3a chronic kidney disease (HCC)   Type 2 diabetes mellitus with diabetic polyneuropathy, with long-term current use of insulin (HCC)   Right below-knee amputee ([place])   Diabetic infection of left foot (HCC)   Chronic diastolic (congestive) heart failure (HCC)   Chest pain   AKI (acute kidney injury) ([place])   Gas gangrene of foot ([place])   Atrial flutter ([place])   Septic shock ([place])   Echo shows newly reduced EF. May be tachycardia mediated. ID requesting TEE, will plan for TEE cardioversion, arranged for 07/28/21.  Patient has good understanding that aflutter is driven by sepsis, and may improve with  convalescence and source control.  Continue IV amiodarone for now since rates are well controlled. Will add further therapy as needed but rates well controlled currently.  Ok to start anticoagulation, with CHADS2VASC of at least 2 (HTN, DM). If further surgeries plan, use IV heparin. If no further procedures, can start DOAC. Discussed with pharmacist.      For questions or updates, please contact [place] Please consult [email or web address] for contact info under        Signed, Elouise Munroe, MD

## 2021-07-26 DIAGNOSIS — E11628 Type 2 diabetes mellitus with other skin complications: Secondary | ICD-10-CM | POA: Diagnosis not present

## 2021-07-26 DIAGNOSIS — I519 Heart disease, unspecified: Secondary | ICD-10-CM

## 2021-07-26 DIAGNOSIS — A419 Sepsis, unspecified organism: Secondary | ICD-10-CM | POA: Diagnosis not present

## 2021-07-26 DIAGNOSIS — I4892 Unspecified atrial flutter: Secondary | ICD-10-CM | POA: Diagnosis not present

## 2021-07-26 DIAGNOSIS — I5032 Chronic diastolic (congestive) heart failure: Secondary | ICD-10-CM | POA: Diagnosis not present

## 2021-07-26 DIAGNOSIS — L089 Local infection of the skin and subcutaneous tissue, unspecified: Secondary | ICD-10-CM | POA: Diagnosis not present

## 2021-07-26 DIAGNOSIS — N179 Acute kidney failure, unspecified: Secondary | ICD-10-CM | POA: Diagnosis not present

## 2021-07-26 DIAGNOSIS — R6520 Severe sepsis without septic shock: Secondary | ICD-10-CM | POA: Diagnosis not present

## 2021-07-26 LAB — GLUCOSE, CAPILLARY
Glucose-Capillary: 237 mg/dL — ABNORMAL HIGH (ref 70–99)
Glucose-Capillary: 167 mg/dL — ABNORMAL HIGH (ref 70–99)
Glucose-Capillary: 269 mg/dL — ABNORMAL HIGH (ref 70–99)
Glucose-Capillary: 307 mg/dL — ABNORMAL HIGH (ref 70–99)
Glucose-Capillary: 282 mg/dL — ABNORMAL HIGH (ref 70–99)

## 2021-07-26 LAB — BASIC METABOLIC PANEL
Anion gap: 8 (ref 5–15)
BUN: 94 mg/dL — ABNORMAL HIGH (ref 6–20)
CO2: 19 mmol/L — ABNORMAL LOW (ref 22–32)
Calcium: 7.7 mg/dL — ABNORMAL LOW (ref 8.9–10.3)
Chloride: 100 mmol/L (ref 98–111)
Creatinine, Ser: 2.83 mg/dL — ABNORMAL HIGH (ref 0.61–1.24)
GFR, Estimated: 26 mL/min — ABNORMAL LOW (ref >60)
Glucose, Bld: 157 mg/dL — ABNORMAL HIGH (ref 70–99)
Potassium: 3.7 mmol/L (ref 3.5–5.1)
Sodium: 127 mmol/L — ABNORMAL LOW (ref 135–145)

## 2021-07-26 LAB — CBC
HCT: 40.7 % (ref 39.0–52.0)
Hemoglobin: 14.2 g/dL (ref 13.0–17.0)
MCH: 28.5 pg (ref 26.0–34.0)
MCHC: 34.9 g/dL (ref 30.0–36.0)
MCV: 81.7 fL (ref 80.0–100.0)
Platelets: 85 10*3/uL — ABNORMAL LOW (ref 150–400)
RBC: 4.98 MIL/uL (ref 4.22–5.81)
RDW: 15.6 % — ABNORMAL HIGH (ref 11.5–15.5)
WBC: 13.4 10*3/uL — ABNORMAL HIGH (ref 4.0–10.5)
nRBC: 0 % (ref 0.0–0.2)

## 2021-07-26 LAB — MAGNESIUM: Magnesium: 2.2 mg/dL (ref 1.7–2.4)

## 2021-07-26 MED ORDER — APIXABAN 5 MG PO TABS
5.0000 mg | ORAL_TABLET | Freq: Two times a day (BID) | ORAL | Status: DC
  Administered 2021-07-26 – 2021-08-07 (×24): 5 mg via ORAL
  Filled 2021-07-26 (×25): qty 1

## 2021-07-26 MED ORDER — AMIODARONE HCL 200 MG PO TABS
200.0000 mg | ORAL_TABLET | Freq: Two times a day (BID) | ORAL | Status: DC
  Administered 2021-07-26 – 2021-08-05 (×20): 200 mg via ORAL
  Filled 2021-07-26 (×21): qty 1

## 2021-07-26 MED ORDER — GABAPENTIN 300 MG PO CAPS
300.0000 mg | ORAL_CAPSULE | Freq: Three times a day (TID) | ORAL | Status: DC
  Administered 2021-07-26 – 2021-08-28 (×99): 300 mg via ORAL
  Filled 2021-07-26 (×99): qty 1

## 2021-07-26 MED ORDER — METOPROLOL SUCCINATE ER 25 MG PO TB24
12.5000 mg | ORAL_TABLET | Freq: Every day | ORAL | Status: DC
  Administered 2021-07-26 – 2021-07-27 (×2): 12.5 mg via ORAL
  Filled 2021-07-26 (×2): qty 1

## 2021-07-26 NOTE — Progress Notes (Signed)
NAME:  [name], MRN:  [id number], DOB:  05-28-1966, LOS: 4 ADMISSION DATE:  07/21/2021, CONSULTATION DATE:  07/22/2021 REFERRING MD:  Broadus [name], CHIEF COMPLAINT:  Septic shock   History of Present Illness:  55 y.o. M who presented 11/14 with concern of left leg infection. He states that about 5 days ago he stepped on an unknown object which he feels caused a wound to the area of previous L great toe amputation site resulting in an ulcer that over the last 3 days has become more purulent, erythematous, drainage, and with worsened pain and swelling. He has had shortness of breath, fatigue, and chills. He has been receiving fluid resuscitation to this point without appropriate hemodynamic response. PCCM was consulted for transfer and pressor support. Transferred to ICU, to OR 11/16 for left transtibial amputation.   Pertinent  Medical History  Poorly controlled type 2 diabetes mellitus, diabetic foot infection with prior right BKA, chronic diastolic CHF, hypertension, dyslipidemia  Significant Hospital Events: Including procedures, antibiotic start and stop dates in addition to other pertinent events   11/14 Admitted with DKA, sepsis secondary to toe infection  11/16 S/p amputation by Dr. Sharol Given.   Interim History / Subjective:  Patient remains afebrile Remains off vasopressors for 24 hours Continue to complain of severe back pain, different from before though MRI cervical spine is negative for acute findings  Objective   Blood pressure 116/74, pulse 84, temperature 98.4 F (36.9 C), temperature source Axillary, resp. rate 13, height 6\' 3"  (1.905 m), weight 108.9 kg, SpO2 93 %.        Intake/Output Summary (Last 24 hours) at 07/26/2021 1042 Last data filed at 07/26/2021 0945 Gross per 24 hour  Intake 118.79 ml  Output 1500 ml  Net -1381.21 ml   Filed Weights   07/22/21 0015 07/23/21 0827  Weight: 108.9 kg 108.9 kg    Exam: General: Acute on chronically ill looking adult male  lying in bed HEENT: MM pink/moist, no jvd, anicteric Neuro: AAOx4, speech clear, MAE normal strength.  CV: Irregularly irregular controlled rate, AF on monitor, no m/r/g PULM: non-labored at rest, lungs bilaterally clear GI: soft, bsx4 active Extremities: warm/dry, BUE dependent edema.  LLE with post BKA amputation brace in place, RLE BKA.  Skin: no rashes or lesions  Resolved Hospital Problem list   Hypomagnesemia Septic shock  Assessment & Plan:   Sepsis due to strep Agalactiae Bacteremia due to Diabetic Foot Infection (POA) Shock has improved, patient is off vasopressors for more than 24 hours Appreciate ID input, patient's antibiotics were switched from ceftriaxone to penicillin Repeat blood cultures are still positive  Diabetic Foot Infection s/p Left Transtibial Amputation  Amputation 11/16 in setting of non-salvagable LLE Post operative & wound care per Dr. Sharol Given  Poor insight into DM control and end organ effects  Wound VAC per ortho, f/u as outpatient   AKI on CKD IV Acute metabolic acidosis due to AKI Hyponatremia  Hypocalcemia  Serum creatinine is improving He started making urine Corrected Na 128 Closely monitor urine output and BMP Continue aggressive electrolyte supplements Avoid nephrotoxic agents, ensure adequate renal perfusion Serum bicarbonate is stable  Persistent Atrial Flutter  Chronic diastolic CHF  CHADsVASC 2 Continue telemetry monitoring Patient's heart rate is well controlled now Continue 200 mg p.o. amiodarone Continue apixaban for stroke prophylaxis Appreciate Cardiology input Patient is scheduled for TEE/cardioversion on 11/21 per cardiology   Poorly controlled diabetes with hyperglycemia Patient's fingersticks are better controlled now Blood sugars are better controlled  Continue levemir 25 units BID, moderate SSI (renal failure) with meals and QHS   Discitis vs Osteomyelitis Patient is complaining of severe back pain MRI with T6,7,8  bone marrow edema, possible discitis / osteomyelitis. No epidural abscess.    Cervical MRI was negative for acute findings Currently on IV antibiotics  Best Practice (right click and "Reselect all SmartList Selections" daily)  Diet/type: Carb modified DVT prophylaxis: apixiban GI prophylaxis: N/A Lines: N/A Foley:  N/A Code Status:  full code Last date of multidisciplinary goals of care discussion: full code     Jacky Kindle MD [facility] See Amion for pager If no response to pager, please call [phone] until 7pm After 7pm, Please call E-link [phone]

## 2021-07-26 NOTE — Progress Notes (Signed)
Progress Note  Patient Name: [name] Date of Encounter: 07/26/2021  [facility] Cardiologist: Sanda Klein, MD   Subjective   No complaints  Inpatient Medications    Scheduled Meds:  acetaminophen  650 mg Oral Q6H   amiodarone  200 mg Oral Daily   apixaban  2.5 mg Oral BID   vitamin C  1,000 mg Oral Daily   Chlorhexidine Gluconate Cloth  6 each Topical Daily   Chlorhexidine Gluconate Cloth  6 each Topical Q0600   docusate sodium  100 mg Oral Daily   gabapentin  100 mg Oral BID   insulin aspart  0-15 Units Subcutaneous TID WC   insulin aspart  0-5 Units Subcutaneous QHS   insulin aspart  3 Units Subcutaneous TID WC   insulin detemir  25 Units Subcutaneous BID   mupirocin ointment  1 application Nasal BID   nutrition supplement (JUVEN)  1 packet Oral BID BM   polyethylene glycol  17 g Oral Daily   zinc sulfate  220 mg Oral Daily   Continuous Infusions:  sodium chloride     lactated ringers Stopped (07/23/21 1013)   magnesium sulfate bolus IVPB     penicillin g continuous IV infusion 8 Million Units (07/26/21 1020)   PRN Meds: acetaminophen, alum & mag hydroxide-simeth, bisacodyl, dextrose, diphenhydrAMINE, guaiFENesin-dextromethorphan, hydrALAZINE, HYDROmorphone (DILAUDID) injection, labetalol, magnesium sulfate bolus IVPB, metoprolol tartrate, ondansetron, oxyCODONE, phenol, polyethylene glycol, potassium chloride   Vital Signs    Vitals:   07/26/21 0700 07/26/21 0708 07/26/21 0800 07/26/21 0900  BP: 120/60  119/70 116/74  Pulse: (!) 47  (!) 45 84  Resp: 18  20 13   Temp:  98.4 F (36.9 C)    TempSrc:  Axillary    SpO2: 93%  93% 93%  Weight:      Height:        Intake/Output Summary (Last 24 hours) at 07/26/2021 1103 Last data filed at 07/26/2021 0945 Gross per 24 hour  Intake 118.79 ml  Output 1500 ml  Net -1381.21 ml   Last 3 Weights 07/23/2021 07/22/2021 05/16/2021  Weight (lbs) 240 lb 240 lb 240 lb  Weight (kg) 108.863 kg 108.863 kg  108.863 kg      Telemetry    Rate controlled aflutter - Personally Reviewed  ECG    N/a - Personally Reviewed  Physical Exam   GEN: No acute distress.   Neck: No JVD Cardiac:irregular Respiratory: Clear to auscultation bilaterally. GI: Soft, nontender, non-distended  MS: No edema; No deformity. Neuro:  Nonfocal  Psych: Normal affect   Labs    High Sensitivity Troponin:   Recent Labs  Lab 07/22/21 0246 07/22/21 0645  TROPONINIHS 52* 44*     Chemistry Recent Labs  Lab 07/21/21 2003 07/21/21 2249 07/24/21 0257 07/25/21 0154 07/26/21 0055  NA 123*   < > 125* 127* 127*  K 4.7   < > 4.2 3.8 3.7  CL 87*   < > 97* 98 100  CO2 18*   < > 17* 19* 19*  GLUCOSE 516*   < > 298* 181* 157*  BUN 40*   < > 76* 94* 94*  CREATININE 2.76*   < > 3.29* 3.23* 2.83*  CALCIUM 8.6*   < > 7.8* 7.9* 7.7*  MG  --   --  2.1 2.3 2.2  PROT 5.6*  --   --   --   --   ALBUMIN 2.0*  --   --   --   --  AST 28  --   --   --   --   ALT 40  --   --   --   --   ALKPHOS 242*  --   --   --   --   BILITOT 1.0  --   --   --   --   GFRNONAA 26*   < > 21* 22* 26*  ANIONGAP 18*   < > 11 10 8    < > = values in this interval not displayed.    Lipids No results for input(s): CHOL, TRIG, HDL, LABVLDL, LDLCALC, CHOLHDL in the last 168 hours.  Hematology Recent Labs  Lab 07/24/21 0257 07/25/21 0154 07/26/21 0055  WBC 19.0* 15.0* 13.4*  RBC 5.15 4.95 4.98  HGB 14.5 14.1 14.2  HCT 42.6 41.1 40.7  MCV 82.7 83.0 81.7  MCH 28.2 28.5 28.5  MCHC 34.0 34.3 34.9  RDW 15.2 15.7* 15.6*  PLT 89* 102* 85*   Thyroid No results for input(s): TSH, FREET4 in the last 168 hours.  BNPNo results for input(s): BNP, PROBNP in the last 168 hours.  DDimer No results for input(s): DDIMER in the last 168 hours.   Radiology    MR CERVICAL SPINE WO CONTRAST  Result Date: 07/25/2021 CLINICAL DATA:  Back pain and bacteremia EXAM: MRI CERVICAL SPINE WITHOUT CONTRAST TECHNIQUE: Multiplanar, multisequence MR imaging of  the cervical spine was performed. No intravenous contrast was administered. COMPARISON:  None. FINDINGS: The examination is markedly degraded by motion. Alignment: Physiologic. Vertebrae: No fracture, evidence of discitis, or bone lesion. Cord: Normal signal and morphology. Posterior Fossa, vertebral arteries, paraspinal tissues: Negative. No epidural collection is visible. Disc levels: Limited assessment of the neural foramina due to the degree of motion. No high-grade spinal canal stenosis. IMPRESSION: Markedly motion degraded examination. Within that limitation, no evidence of discitis-osteomyelitis or epidural collection. Electronically Signed   By: Ulyses Jarred M.D.   On: 07/25/2021 18:59    Cardiac Studies     Patient Profile     55 y.o. male with a hx of HFpEF (EF = 50%), HTN, DM2, CKD 3, osteomyelitis s/p R BKA who is being seen 07/22/2021 for the evaluation new diagnosis of systolic dysfunction  Assessment & Plan    Systolic dysfunction (new diagnosis this admission) - 07/2021 echo LVEF 35%, indet diastolic fxn, mod RV dysfunction, PASP is 53 - 04/2021 echo 50-55% - medical therapy limited by renal dysfunction. Start toprol 12.5mg  daiily today. - may be tachy mediated CM givne also diagnosed with new onset aflutter with RVR, or possibly stress induced CM given bateremia and septic shock. Would treat medically and repeat echos in next few months. Renal function would prohibit cath as well in the near future.    2. New onset Aflutter - started on amio this admission, IV has been transitioned to oral - started on eliquis. Age 55, wt 108 kg, Cr 2.8 would increase eliquis to 5mg  bid - TEE/DCCV likely early next week   3. Sepsis/bacteremia strep Agalactiae Bacteremia due to Diabetic Foot Infection  Initially septic shock, now off pressors - ID has requested TEE. Likely TEE/DCCV early this week.   4. AKI on CKD    For questions or updates, please contact [place] Please  consult [email or web address] for contact info under        Signed, Carlyle Dolly, MD  07/26/2021, 11:03 AM

## 2021-07-26 NOTE — Progress Notes (Signed)
Patient's bed pad soaked with urine - patient refusing to allow nursing staff to change pads and states that, "It's too painful, I know it sounds gross."

## 2021-07-26 NOTE — Progress Notes (Signed)
[name], male   DOB: 1966-06-07, 55 y.o.   MRN: [id number] Patient is postoperative day 3 transtibial amputation.  There is no drainage in the wound VAC canister.  There is not a good suction fit there are 2 xs.  Patient still being worked up for further sites of involvement from his sepsis.

## 2021-07-27 ENCOUNTER — Encounter (HOSPITAL_COMMUNITY): Payer: Self-pay | Admitting: Internal Medicine

## 2021-07-27 DIAGNOSIS — M4624 Osteomyelitis of vertebra, thoracic region: Secondary | ICD-10-CM | POA: Diagnosis not present

## 2021-07-27 DIAGNOSIS — R6521 Severe sepsis with septic shock: Secondary | ICD-10-CM | POA: Diagnosis not present

## 2021-07-27 DIAGNOSIS — L8990 Pressure ulcer of unspecified site, unspecified stage: Secondary | ICD-10-CM

## 2021-07-27 DIAGNOSIS — A419 Sepsis, unspecified organism: Secondary | ICD-10-CM | POA: Diagnosis not present

## 2021-07-27 DIAGNOSIS — N179 Acute kidney failure, unspecified: Secondary | ICD-10-CM | POA: Diagnosis not present

## 2021-07-27 DIAGNOSIS — D179 Benign lipomatous neoplasm, unspecified: Secondary | ICD-10-CM | POA: Diagnosis not present

## 2021-07-27 DIAGNOSIS — E11628 Type 2 diabetes mellitus with other skin complications: Secondary | ICD-10-CM | POA: Diagnosis not present

## 2021-07-27 DIAGNOSIS — I5032 Chronic diastolic (congestive) heart failure: Secondary | ICD-10-CM | POA: Diagnosis not present

## 2021-07-27 DIAGNOSIS — I519 Heart disease, unspecified: Secondary | ICD-10-CM | POA: Diagnosis not present

## 2021-07-27 DIAGNOSIS — L089 Local infection of the skin and subcutaneous tissue, unspecified: Secondary | ICD-10-CM | POA: Diagnosis not present

## 2021-07-27 DIAGNOSIS — R6520 Severe sepsis without septic shock: Secondary | ICD-10-CM | POA: Diagnosis not present

## 2021-07-27 DIAGNOSIS — A480 Gas gangrene: Secondary | ICD-10-CM | POA: Diagnosis not present

## 2021-07-27 DIAGNOSIS — I4892 Unspecified atrial flutter: Secondary | ICD-10-CM | POA: Diagnosis not present

## 2021-07-27 HISTORY — DX: Pressure ulcer of unspecified site, unspecified stage: L89.90

## 2021-07-27 LAB — BASIC METABOLIC PANEL
Anion gap: 8 (ref 5–15)
BUN: 78 mg/dL — ABNORMAL HIGH (ref 6–20)
CO2: 22 mmol/L (ref 22–32)
Calcium: 7.9 mg/dL — ABNORMAL LOW (ref 8.9–10.3)
Chloride: 100 mmol/L (ref 98–111)
Creatinine, Ser: 2.25 mg/dL — ABNORMAL HIGH (ref 0.61–1.24)
GFR, Estimated: 34 mL/min — ABNORMAL LOW (ref >60)
Glucose, Bld: 255 mg/dL — ABNORMAL HIGH (ref 70–99)
Potassium: 3.7 mmol/L (ref 3.5–5.1)
Sodium: 130 mmol/L — ABNORMAL LOW (ref 135–145)

## 2021-07-27 LAB — CBC
HCT: 45.4 % (ref 39.0–52.0)
Hemoglobin: 15.1 g/dL (ref 13.0–17.0)
MCH: 28 pg (ref 26.0–34.0)
MCHC: 33.3 g/dL (ref 30.0–36.0)
MCV: 84.2 fL (ref 80.0–100.0)
Platelets: 140 10*3/uL — ABNORMAL LOW (ref 150–400)
RBC: 5.39 MIL/uL (ref 4.22–5.81)
RDW: 15.9 % — ABNORMAL HIGH (ref 11.5–15.5)
WBC: 16 10*3/uL — ABNORMAL HIGH (ref 4.0–10.5)
nRBC: 0 % (ref 0.0–0.2)

## 2021-07-27 LAB — GLUCOSE, CAPILLARY
Glucose-Capillary: 214 mg/dL — ABNORMAL HIGH (ref 70–99)
Glucose-Capillary: 276 mg/dL — ABNORMAL HIGH (ref 70–99)
Glucose-Capillary: 212 mg/dL — ABNORMAL HIGH (ref 70–99)
Glucose-Capillary: 279 mg/dL — ABNORMAL HIGH (ref 70–99)
Glucose-Capillary: 291 mg/dL — ABNORMAL HIGH (ref 70–99)

## 2021-07-27 LAB — BRAIN NATRIURETIC PEPTIDE: B Natriuretic Peptide: 1368.1 pg/mL — ABNORMAL HIGH (ref 0.0–100.0)

## 2021-07-27 LAB — MAGNESIUM: Magnesium: 2.2 mg/dL (ref 1.7–2.4)

## 2021-07-27 MED ORDER — METOPROLOL SUCCINATE ER 25 MG PO TB24
25.0000 mg | ORAL_TABLET | Freq: Every day | ORAL | Status: DC
  Administered 2021-07-28 – 2021-07-31 (×4): 25 mg via ORAL
  Filled 2021-07-27 (×4): qty 1

## 2021-07-27 MED ORDER — INSULIN DETEMIR 100 UNIT/ML ~~LOC~~ SOLN
15.0000 [IU] | Freq: Two times a day (BID) | SUBCUTANEOUS | Status: DC
  Administered 2021-07-27 – 2021-07-29 (×4): 15 [IU] via SUBCUTANEOUS
  Filled 2021-07-27 (×5): qty 0.15

## 2021-07-27 MED ORDER — METOPROLOL SUCCINATE ER 25 MG PO TB24
12.5000 mg | ORAL_TABLET | Freq: Once | ORAL | Status: AC
  Administered 2021-07-27: 12.5 mg via ORAL
  Filled 2021-07-27: qty 1

## 2021-07-27 MED ORDER — INFLUENZA VAC SPLIT QUAD 0.5 ML IM SUSY
0.5000 mL | PREFILLED_SYRINGE | INTRAMUSCULAR | Status: AC
  Administered 2021-07-28: 0.5 mL via INTRAMUSCULAR
  Filled 2021-07-27: qty 0.5

## 2021-07-27 NOTE — Progress Notes (Signed)
Progress Note  Patient Name: [name] Date of Encounter: 07/27/2021  [facility] Cardiologist: Sanda Klein, MD   Subjective   No complaints  Inpatient Medications    Scheduled Meds:  amiodarone  200 mg Oral BID   apixaban  5 mg Oral BID   vitamin C  1,000 mg Oral Daily   docusate sodium  100 mg Oral Daily   gabapentin  300 mg Oral TID   insulin aspart  0-15 Units Subcutaneous TID WC   insulin aspart  0-5 Units Subcutaneous QHS   insulin aspart  3 Units Subcutaneous TID WC   insulin detemir  25 Units Subcutaneous BID   metoprolol succinate  12.5 mg Oral Daily   mupirocin ointment  1 application Nasal BID   nutrition supplement (JUVEN)  1 packet Oral BID BM   polyethylene glycol  17 g Oral Daily   zinc sulfate  220 mg Oral Daily   Continuous Infusions:  sodium chloride     lactated ringers Stopped (07/23/21 1013)   magnesium sulfate bolus IVPB     penicillin g continuous IV infusion 8 Million Units (07/27/21 1228)   PRN Meds: acetaminophen, alum & mag hydroxide-simeth, bisacodyl, dextrose, diphenhydrAMINE, guaiFENesin-dextromethorphan, hydrALAZINE, HYDROmorphone (DILAUDID) injection, labetalol, magnesium sulfate bolus IVPB, metoprolol tartrate, ondansetron, oxyCODONE, phenol, polyethylene glycol, potassium chloride   Vital Signs    Vitals:   07/27/21 0000 07/27/21 0252 07/27/21 0753 07/27/21 1217  BP: 134/67 129/66 132/80 136/71  Pulse: (!) 46 (!) 47 68 (!) 42  Resp: 16 (!) 22 18 18   Temp:  98.7 F (37.1 C) 98.5 F (36.9 C) 99.1 F (37.3 C)  TempSrc:  Axillary Oral Axillary  SpO2: 94% 95% 96% 95%  Weight:  108.2 kg    Height:  6\' 3"  (1.905 m)      Intake/Output Summary (Last 24 hours) at 07/27/2021 1435 Last data filed at 07/27/2021 0457 Gross per 24 hour  Intake 688.51 ml  Output 1250 ml  Net -561.49 ml   Last 3 Weights 07/27/2021 07/23/2021 07/22/2021  Weight (lbs) 238 lb 8.6 oz 240 lb 240 lb  Weight (kg) 108.2 kg 108.863 kg 108.863 kg       Telemetry    Aflutter variable rates - Personally Reviewed  ECG    N/a - Personally Reviewed  Physical Exam   GEN: No acute distress.   Neck: No JVD Cardiac: irreg Respiratory: Clear to auscultation bilaterally. GI: Soft, nontender, non-distended  MS: No edema; No deformity. Neuro:  Nonfocal  Psych: Normal affect   Labs    High Sensitivity Troponin:   Recent Labs  Lab 07/22/21 0246 07/22/21 0645  TROPONINIHS 52* 44*     Chemistry Recent Labs  Lab 07/21/21 2003 07/21/21 2249 07/25/21 0154 07/26/21 0055 07/27/21 0126  NA 123*   < > 127* 127* 130*  K 4.7   < > 3.8 3.7 3.7  CL 87*   < > 98 100 100  CO2 18*   < > 19* 19* 22  GLUCOSE 516*   < > 181* 157* 255*  BUN 40*   < > 94* 94* 78*  CREATININE 2.76*   < > 3.23* 2.83* 2.25*  CALCIUM 8.6*   < > 7.9* 7.7* 7.9*  MG  --    < > 2.3 2.2 2.2  PROT 5.6*  --   --   --   --   ALBUMIN 2.0*  --   --   --   --   AST  28  --   --   --   --   ALT 40  --   --   --   --   ALKPHOS 242*  --   --   --   --   BILITOT 1.0  --   --   --   --   GFRNONAA 26*   < > 22* 26* 34*  ANIONGAP 18*   < > 10 8 8    < > = values in this interval not displayed.    Lipids No results for input(s): CHOL, TRIG, HDL, LABVLDL, LDLCALC, CHOLHDL in the last 168 hours.  Hematology Recent Labs  Lab 07/25/21 0154 07/26/21 0055 07/27/21 0126  WBC 15.0* 13.4* 16.0*  RBC 4.95 4.98 5.39  HGB 14.1 14.2 15.1  HCT 41.1 40.7 45.4  MCV 83.0 81.7 84.2  MCH 28.5 28.5 28.0  MCHC 34.3 34.9 33.3  RDW 15.7* 15.6* 15.9*  PLT 102* 85* 140*   Thyroid No results for input(s): TSH, FREET4 in the last 168 hours.  BNP Recent Labs  Lab 07/27/21 0126  BNP 1,368.1*    DDimer No results for input(s): DDIMER in the last 168 hours.   Radiology    MR CERVICAL SPINE WO CONTRAST  Result Date: 07/25/2021 CLINICAL DATA:  Back pain and bacteremia EXAM: MRI CERVICAL SPINE WITHOUT CONTRAST TECHNIQUE: Multiplanar, multisequence MR imaging of the cervical spine  was performed. No intravenous contrast was administered. COMPARISON:  None. FINDINGS: The examination is markedly degraded by motion. Alignment: Physiologic. Vertebrae: No fracture, evidence of discitis, or bone lesion. Cord: Normal signal and morphology. Posterior Fossa, vertebral arteries, paraspinal tissues: Negative. No epidural collection is visible. Disc levels: Limited assessment of the neural foramina due to the degree of motion. No high-grade spinal canal stenosis. IMPRESSION: Markedly motion degraded examination. Within that limitation, no evidence of discitis-osteomyelitis or epidural collection. Electronically Signed   By: Ulyses Jarred M.D.   On: 07/25/2021 18:59    Cardiac Studies     Patient Profile     55 y.o. male with a hx of HFpEF (EF = 50%), HTN, DM2, CKD 3, osteomyelitis s/p R BKA who is being seen 07/22/2021 for the evaluation new diagnosis of systolic dysfunction  Assessment & Plan    Systolic dysfunction (new diagnosis this admission) - 07/2021 echo LVEF 26%, indet diastolic fxn, mod RV dysfunction, PASP is 53 - 04/2021 echo 50-55% - medical therapy limited by renal dysfunction,initial hypotension now resolved. Started toprol 12.5mg  daiily, increase to 25mg  daily.  - may be tachy mediated CM givne also diagnosed with new onset aflutter with RVR, or possibly stress induced CM given bateremia and septic shock. Would treat medically and repeat echo in next few months. Renal function would prohibit cath as well in the near future.      2. New onset Aflutter - started on amio this admission, IV has been transitioned to oral - started on eliquis. Age 70, wt 108 kg, Cr 2.8 would increase eliquis to 5mg  bid - will try TEE/DCCV tomorrow     3. Sepsis/bacteremia strep Agalactiae Bacteremia due to Diabetic Foot Infection  Initially septic shock, now off pressors - ID has requested TEE. Likely TEE/DCCV early this week.    4. AKI on CKD  Shared Decision Making/Informed  Consent The risks [stroke, cardiac arrhythmias rarely resulting in the need for a temporary or permanent pacemaker, skin irritation or burns, esophageal damage, perforation (1:10,000 risk), bleeding, pharyngeal hematoma as well as other potential complications  associated with conscious sedation including aspiration, arrhythmia, respiratory failure and death], benefits (treatment guidance, restoration of normal sinus rhythm, diagnostic support) and alternatives of a transesophageal echocardiogram guided cardioversion were discussed in detail with Mr. Zelek and he is willing to proceed.     For questions or updates, please contact [place] Please consult [email or web address] for contact info under        Signed, Carlyle Dolly, MD  07/27/2021, 2:35 PM

## 2021-07-27 NOTE — Consult Note (Signed)
Reason for Consult:?  Thoracic osteomyelitis Referring Physician: Infectious disease  [name] is an 55 y.o. male.   HPI:  55 year old gentleman who was admitted a few days ago with osteomyelitis and infection in the foot requiring BKA.  He is complaining of severe thoracic back pain on the left side.  He denies leg pain or numbness tingling or weakness down the legs.  He has a prosthesis for the right leg after BKA done 5 years ago.  History of diabetes.  Infectious disease has been on culture directed antibiotics.  He had an MRI of his cervical thoracic and lumbar spine and there was question of osteomyelitis in the thoracic spine.  No evidence of epidural abscess or neurologic compromise at this time.  Past Medical History:  Diagnosis Date   Chronic diastolic CHF (congestive heart failure) ([place])    Diabetes mellitus without complication ([place])    Hypertension     Past Surgical History:  Procedure Laterality Date   AMPUTATION Left 07/23/2021   Procedure: LEFT BELOW KNEE AMPUTATION;  Surgeon: [name], MD;  Location: [place];  Service: Orthopedics;  Laterality: Left;   BELOW KNEE LEG AMPUTATION Right     Allergies  Allergen Reactions   Bactrim [Sulfamethoxazole-Trimethoprim]    Ceprotin [Protein C Concentrate (Human)]    Ciprofloxacin Other (See Comments)    Kidney function   Levaquin [Levofloxacin]     Social History   Tobacco Use   Smoking status: Never   Smokeless tobacco: Never  Substance Use Topics   Alcohol use: Never    Family History  Problem Relation Age of Onset   Anxiety disorder Mother    Cancer Father    Coronary artery disease Father      Review of Systems  Positive ROS: As above  All other systems have been reviewed and were otherwise negative with the exception of those mentioned in the HPI and as above.  Objective: Vital signs in last 24 hours: Temp:  [97.9 F (36.6 C)-98.7 F (37.1 C)] 98.5 F (36.9 C) (11/20 0753) Pulse Rate:   [45-105] 68 (11/20 0753) Resp:  [14-30] 18 (11/20 0753) BP: (105-140)/(58-83) 132/80 (11/20 0753) SpO2:  [92 %-96 %] 96 % (11/20 0753) Weight:  [108.2 kg] 108.2 kg (11/20 0252)  General Appearance: Alert, cooperative, no distress but in some discomfort, appears stated age Head: Normocephalic, without obvious abnormality, atraumatic Eyes: PERRL, conjunctiva/corneas clear, EOM's intact    Throat: benign Neck: Supple, symmetrical, trachea midline Lungs: Clear to auscultation bilaterally, respirations unlabored Heart: Regular rate and rhythm Abdomen: Soft Extremities: Bilateral BKA   NEUROLOGIC:   Mental status: A&O x4, no aphasia, good attention span, Memory and fund of knowledge ibuprofen Motor Exam - grossly normal, normal tone and bulk Coordination - grossly normal Gait -unable to test Balance -unable to test Cranial Nerves: I: smell Not tested  II: visual acuity  OS: na    OD: na  II: visual fields Full to confrontation  II: pupils Equal, round, reactive to light  III,VII: ptosis   III,IV,VI: extraocular muscles    V: mastication   V: facial light touch sensation    V,VII: corneal reflex    VII: facial muscle function - upper    VII: facial muscle function - lower   VIII: hearing   IX: soft palate elevation    IX,X: gag reflex   XI: trapezius strength    XI: sternocleidomastoid strength   XI: neck flexion strength    XII: tongue strength  Data Review Lab Results  Component Value Date   WBC 16.0 (H) 07/27/2021   HGB 15.1 07/27/2021   HCT 45.4 07/27/2021   MCV 84.2 07/27/2021   PLT 140 (L) 07/27/2021   Lab Results  Component Value Date   NA 130 (L) 07/27/2021   K 3.7 07/27/2021   CL 100 07/27/2021   CO2 22 07/27/2021   BUN 78 (H) 07/27/2021   CREATININE 2.25 (H) 07/27/2021   GLUCOSE 255 (H) 07/27/2021   Lab Results  Component Value Date   INR 1.3 (H) 07/22/2021    Radiology: DG Chest 1 View  Result Date: 07/22/2021 EXAM: EXAM CHEST  1 VIEW  COMPARISON:  Portable AP chest today at 8:08 p.m. FINDINGS: Provided are 2 separate lateral chest radiographs on this patient. There is a trace left pleural effusion but there is no convincing overlying infiltrate. Exaggerated thoracic kyphosis is noted with multilevel thoracic spine degenerative discs. IMPRESSION: Trace left pleural effusion visible on the lateral view, but no definitive adjacent infiltrate. Electronically Signed   By: Telford Nab M.D.   On: 07/22/2021 00:52   CT Foot Left Wo Contrast  Result Date: 07/22/2021 CLINICAL DATA:  Left foot infection EXAM: CT OF THE LEFT FOOT WITHOUT CONTRAST TECHNIQUE: Multidetector CT imaging of the left foot was performed according to the standard protocol. Multiplanar CT image reconstructions were also generated. COMPARISON:  None. FINDINGS: Bones/Joint/Cartilage Transmetatarsal amputation of the first and second digits of the left foot are noted. Intra osseous gas and cortical erosion is seen involving the base of the residual proximal phalanx, metatarsal head, and sesamoid bones of the great toe in keeping with changes of osteomyelitis. Superimposed advanced degenerative arthritis of the first MTP joint. Remaining joint spaces are preserved. No acute fracture or dislocation. No additional sites of osseous erosion identified. Ligaments Suboptimally assessed by CT. Muscles and Tendons Unremarkable Soft tissues There is extensive soft tissue swelling involving the residual great toe with extensive subcutaneous gas surrounding the first MTP joint and extending medially and laterally into the plantar soft tissues subjacent to the second metatarsal. Moderate subcutaneous edema of the left foot and ankle. No discrete drainable subcutaneous fluid collection identified. Plantar ulcer noted within the residual great toe. IMPRESSION: Extensive subcutaneous gas in keeping with aggressive soft tissue infection and erosive changes surrounding the first MTP joint in  keeping with septic arthritis/osteomyelitis in this location. No discrete drainable fluid collection identified. Electronically Signed   By: Fidela Salisbury M.D.   On: 07/22/2021 02:33   DG Chest Port 1 View  Result Date: 07/21/2021 CLINICAL DATA:  55 year old male with pain. EXAM: PORTABLE CHEST 1 VIEW COMPARISON:  None. FINDINGS: UPPER limits normal heart size noted. Equivocal LEFT LOWER lobe retrocardiac density/atelectasis may be present. There is no other evidence of focal airspace disease, pulmonary edema, suspicious pulmonary nodule/mass, pleural effusion, or pneumothorax. No acute bony abnormalities are identified. IMPRESSION: 1. Equivocal LEFT LOWER lobe retrocardiac density/atelectasis. Recommend LATERAL chest radiograph if there is clinical suspicion for pneumonia. Electronically Signed   By: Margarette Canada M.D.   On: 07/21/2021 20:50   DG Toe Great Left  Result Date: 07/21/2021 CLINICAL DATA:  Left great toe pain, osteomyelitis EXAM: LEFT GREAT TOE COMPARISON:  None. FINDINGS: Three view radiograph left great toe demonstrates resection of the distal phalanx of the second digit and probable trans phalangeal amputation proximal phalanx of the great toe. There is a soft tissue ulcer involving the distal, medial, plantar aspect the residual left greatq toe. There  is subcutaneous gas within the a dorsal lateral soft tissues of the left great toe. There are periarticular lucencies within the a distal phalanx and distal aspect of the first metatarsal medially suspicious for changes of osteomyelitis/septic arthritis. Superimposed moderate degenerative arthritis of the first MTP joint. IMPRESSION: Postsurgical changes as described above. Subcutaneous gas within the dorsal lateral soft tissues at the level of the first MTP joint with subjacent periarticular lucency within the a base of the first proximal phalanx and distal aspect of the first metatarsal suspicious for changes of septic  arthritis/osteomyelitis. This could be confirmed with contrast enhanced MRI examination. Electronically Signed   By: Fidela Salisbury M.D.   On: 07/21/2021 20:59   MRI of cervical thoracic and lumbar spine reviewed independently as well as the reports.  There is an area T6-T8 which there is abnormal signal within the vertebral bodies and disc space consistent with osteomyelitis and/or degenerative change, no evidence of epidural abscess.  There is epidural lipomatosis from the mid thoracic region down to below the area of concern.  This is best noted on the T1 sagittal imaging.  Assessment/Plan: Estimated body mass index is 29.82 kg/m as calculated from the following:   Height as of this encounter: 6\' 3"  (1.905 m).   Weight as of this encounter: 53.30 kg.  55 year old gentleman with likely vertebral osteomyelitis seen on MRI done without contrast because of his renal dysfunction.  He has epidural lipomatosis seen on T1 imaging.  I do not believe this is epidural abscess.  I do not see anything consistent with epidural abscess.  Therefore I do not believe surgery plays a role in his management at this time.  He is already on culture directed IV antibiotics.  He will likely need at least 6 weeks of this followed by 6 weeks of oral antibiotics for osteomyelitis.  Please let us know if we can be of further assistance in this matter.  We would recommend MRI of the areas of concern with and without contrast if his renal function allows.   Eustace Moore 07/27/2021 11:34 AM

## 2021-07-27 NOTE — Progress Notes (Signed)
Pt found with chewing tobacco and spit cup at bed-side. Pt informed of policy regarding use of tobacco on at [facility]. Pt states "It helps me sleep." Chewing tobacco confiscated and placed with belongings.

## 2021-07-27 NOTE — Progress Notes (Signed)
[name], male   DOB: May 19, 1966, 55 y.o.   MRN: [id number]  PROGRESS NOTE    [name]  [name] DOB: 12/05/1965 DOA: 07/21/2021 PCP: Patient, No Pcp Per (Inactive)   Brief Narrative:  55 year old male with history of diabetes mellitus type 2, diabetic foot infection with prior right BKA, chronic diastolic CHF, hypertension, dyslipidemia presented with worsening left foot swelling/discoloration and discharge.  On presentation, he was hypotensive, tachycardic with A. fib with RVR with CT suggestive of first MTP osteomyelitis and soft tissue gas concerning for possible necrotizing fasciitis along with AKI with creatinine of 2.7 and mild DKA.  His blood pressures did not improve with IV fluids and he was transferred to ICU and PCCM was consulted.  Orthopedics was consulted.  He underwent left transtibial amputation on 07/23/2021.  He was found to have strep agalactiae bacteremia.  ID was consulted.  He was found to have vertebral discitis versus osteomyelitis on MRI.  He was also found to have new systolic dysfunction with LVEF of 35% along with new onset atrial flutter requiring IV amiodarone which has been switched to oral.  Cardiology was consulted.  He has been started on Eliquis.  Cardiology is planning for TEE/DCCV likely early next week.  Assessment & Plan:   Septic shock: Evolving on admission from bacteremia/diabetic foot infection -Off pressors.  Currently hemodynamically stable. -Off IV fluids.  Antibiotics as below. -Required care in ICU under PCCM service: Care has been transferred to [facility] from 07/27/2021 onwards  Diabetic foot infection  -Status post left transtibial amputation on 07/23/2021.  Wound/wound VAC care as per Dr. Sharol Given.  Group B streptococcus bacteremia Possible thoracic discitis/osteomyelitis -TTE showed no vegetation.  Cardiology is planning for TEE possibly early this week. -MRI of cervical/thoracic/lumbar spine showed possible thoracic  osteomyelitis/discitis without evidence of abscess but MRI was without contrast.  ID is concerned that patient still might have underlying abscess and is requesting neurosurgery evaluation.  I have requested neurosurgery input. -Antibiotics have been changed to penicillin.  Patient will probably need 8 weeks of antibiotics per ID. -Repeat blood cultures from 07/24/2021 have been negative so far  AKI on CKD stage IV Acute metabolic acidosis -Renal function improving.  Creatinine improving to 2.25 today.  Off IV fluids.  Monitor -Serum bicarbonate has improved  Hyponatremia -Improving.  Monitor  New systolic dysfunction in a patient with chronic diastolic heart failure -Echo showed EF of 35% (previous echo showed EF of 50 to 55% in 04/2021) -Strict input output.  Daily weights.  Fluid restriction.  Continue conservative management.  Continue metoprolol succinate.  New onset atrial flutter -Currently intermittently bradycardic.  IV amiodarone has been switched to oral amiodarone.  Continue Eliquis and metoprolol succinate.  Cardiology planning for TEE DCCV early next week.  Leukocytosis -Still significant.  Monitor  Diabetes mellitus type 2 with hyperglycemia -Continue Lantus along with CBGs with SSI  Thrombocytopenia -Improving    DVT prophylaxis: Apixaban Code Status: Full Family Communication: None at bedside Disposition Plan: Status is: Inpatient  Remains inpatient appropriate because: Of need for IV antibiotics.  TEE/DCCV planned for early next week  Consultants: Cardiology/orthopedic/ID/neurosurgery/PCCM  Procedures: Left transtibial amputation on 07/23/2021.  Echo  Antimicrobials:  Anti-infectives (From admission, onward)    Start     Dose/Rate Route Frequency Ordered Stop   07/25/21 1000  penicillin G potassium 8 Million Units in dextrose 5 % 500 mL continuous infusion        8 Million Units 41.7 mL/hr over 12  Hours Intravenous Every 12 hours 07/25/21 0854      07/23/21 1345  cefTRIAXone (ROCEPHIN) 2 g in sodium chloride 0.9 % 100 mL IVPB  Status:  Discontinued        2 g 200 mL/hr over 30 Minutes Intravenous Every 24 hours 07/23/21 1246 07/25/21 0854   07/23/21 0815  ceFAZolin (ANCEF) IVPB 2g/100 mL premix  Status:  Discontinued        2 g 200 mL/hr over 30 Minutes Intravenous To ShortStay Surgical 07/23/21 0622 07/23/21 0836   07/22/21 2000  linezolid (ZYVOX) IVPB 600 mg  Status:  Discontinued        600 mg 300 mL/hr over 60 Minutes Intravenous Every 12 hours 07/22/21 1544 07/23/21 1349   07/22/21 0800  piperacillin-tazobactam (ZOSYN) IVPB 3.375 g  Status:  Discontinued        3.375 g 12.5 mL/hr over 240 Minutes Intravenous Every 8 hours 07/22/21 0107 07/23/21 1246   07/22/21 0130  clindamycin (CLEOCIN) IVPB 600 mg  Status:  Discontinued        600 mg 100 mL/hr over 30 Minutes Intravenous Every 8 hours 07/22/21 0101 07/22/21 1544   07/22/21 0115  piperacillin-tazobactam (ZOSYN) IVPB 3.375 g        3.375 g 100 mL/hr over 30 Minutes Intravenous STAT 07/22/21 0107 07/22/21 0223   07/21/21 2335  vancomycin variable dose per unstable renal function (pharmacist dosing)  Status:  Discontinued         Does not apply See admin instructions 07/21/21 2335 07/22/21 1544   07/21/21 2000  vancomycin (VANCOREADY) IVPB 2000 mg/400 mL        2,000 mg 200 mL/hr over 120 Minutes Intravenous  Once 07/21/21 1955 07/22/21 0004   07/21/21 2000  cefTRIAXone (ROCEPHIN) 2 g in sodium chloride 0.9 % 100 mL IVPB        2 g 200 mL/hr over 30 Minutes Intravenous  Once 07/21/21 1955 07/21/21 2130        Subjective: Patient seen and examined at bedside.  Still complains of lower back pain.  Hurts even when he coughs.  Denies any overnight fever or vomiting.  No chest pain reported.  Objective: Vitals:   07/26/21 2300 07/27/21 0000 07/27/21 0252 07/27/21 0753  BP: 130/74 134/67 129/66 132/80  Pulse: 78 (!) 46 (!) 47 68  Resp: 20 16 (!) 22 18  Temp:   98.7 F (37.1  C) 98.5 F (36.9 C)  TempSrc:   Axillary Oral  SpO2: 94% 94% 95% 96%  Weight:   108.2 kg   Height:   6\' 3"  (1.905 m)     Intake/Output Summary (Last 24 hours) at 07/27/2021 1021 Last data filed at 07/27/2021 0457 Gross per 24 hour  Intake 975.31 ml  Output 1250 ml  Net -274.69 ml   Filed Weights   07/22/21 0015 07/23/21 0827 07/27/21 0252  Weight: 108.9 kg 108.9 kg 108.2 kg    Examination:  General exam: Appears calm and comfortable.  Looks chronically ill and deconditioned.  Currently on 3 L oxygen by nasal cannula. Respiratory system: Bilateral decreased breath sounds at bases with scattered crackles and intermittent tachypnea Cardiovascular system: S1 & S2 heard, intermittently bradycardic Gastrointestinal system: Abdomen is nondistended, soft and nontender. Normal bowel sounds heard. Extremities: Left BKA with amputation brace present; right lower extremity BKA present Central nervous system: Alert and oriented. No focal neurological deficits. Moving extremities Skin: No obvious ecchymosis/lesions Psychiatry: Affect is mostly flat.  Intermittently becomes anxious  Data Reviewed: I have personally reviewed following labs and imaging studies  CBC: Recent Labs  Lab 07/21/21 2003 07/21/21 2249 07/23/21 1719 07/24/21 0013 07/24/21 0257 07/25/21 0154 07/26/21 0055 07/27/21 0126  WBC 13.0*   < > 13.5*  --  19.0* 15.0* 13.4* 16.0*  NEUTROABS 12.2*  --   --   --   --   --   --   --   HGB 17.9*   < > 14.3 15.0 14.5 14.1 14.2 15.1  HCT 53.2*   < > 43.0 44.0 42.6 41.1 40.7 45.4  MCV 84.4   < > 83.3  --  82.7 83.0 81.7 84.2  PLT 141*   < > 83*  --  89* 102* 85* 140*   < > = values in this interval not displayed.   Basic Metabolic Panel: Recent Labs  Lab 07/24/21 0002 07/24/21 0013 07/24/21 0257 07/25/21 0154 07/26/21 0055 07/27/21 0126  NA 123* 125* 125* 127* 127* 130*  K 4.9 4.5 4.2 3.8 3.7 3.7  CL 94*  --  97* 98 100 100  CO2 17*  --  17* 19* 19* 22   GLUCOSE 428*  --  298* 181* 157* 255*  BUN 75*  --  76* 94* 94* 78*  CREATININE 3.16*  --  3.29* 3.23* 2.83* 2.25*  CALCIUM 7.6*  --  7.8* 7.9* 7.7* 7.9*  MG  --   --  2.1 2.3 2.2 2.2   GFR: Estimated Creatinine Clearance: 49.3 mL/min (A) (by C-G formula based on SCr of 2.25 mg/dL (H)). Liver Function Tests: Recent Labs  Lab 07/21/21 2003  AST 28  ALT 40  ALKPHOS 242*  BILITOT 1.0  PROT 5.6*  ALBUMIN 2.0*   No results for input(s): LIPASE, AMYLASE in the last 168 hours. No results for input(s): AMMONIA in the last 168 hours. Coagulation Profile: Recent Labs  Lab 07/21/21 2003 07/22/21 0350  INR 1.4* 1.3*   Cardiac Enzymes: Recent Labs  Lab 07/24/21 1010  CKTOTAL 265   BNP (last 3 results) No results for input(s): PROBNP in the last 8760 hours. HbA1C: No results for input(s): HGBA1C in the last 72 hours. CBG: Recent Labs  Lab 07/26/21 1510 07/26/21 1655 07/26/21 2109 07/27/21 0654 07/27/21 0749  GLUCAP 269* 307* 282* 214* 276*   Lipid Profile: No results for input(s): CHOL, HDL, LDLCALC, TRIG, CHOLHDL, LDLDIRECT in the last 72 hours. Thyroid Function Tests: No results for input(s): TSH, T4TOTAL, FREET4, T3FREE, THYROIDAB in the last 72 hours. Anemia Panel: No results for input(s): VITAMINB12, FOLATE, FERRITIN, TIBC, IRON, RETICCTPCT in the last 72 hours. Sepsis Labs: Recent Labs  Lab 07/22/21 0300 07/22/21 0350 07/22/21 0645 07/22/21 1250 07/22/21 1522  PROCALCITON  --  21.90  --   --   --   LATICACIDVEN 3.2*  --  3.0* 3.1* 2.5*    Recent Results (from the past 240 hour(s))  Resp Panel by RT-PCR (Flu A&B, Covid) Nasopharyngeal Swab     Status: None   Collection Time: 07/21/21  7:54 PM   Specimen: Nasopharyngeal Swab; Nasopharyngeal(NP) swabs in vial transport medium  Result Value Ref Range Status   SARS Coronavirus 2 by RT PCR NEGATIVE NEGATIVE Final    Comment: (NOTE) SARS-CoV-2 target nucleic acids are NOT DETECTED.  The SARS-CoV-2 RNA is  generally detectable in upper respiratory specimens during the acute phase of infection. The lowest concentration of SARS-CoV-2 viral copies this assay can detect is 138 copies/mL. A negative result does not preclude SARS-Cov-2  infection and should not be used as the sole basis for treatment or other patient management decisions. A negative result may occur with  improper specimen collection/handling, submission of specimen other than nasopharyngeal swab, presence of viral mutation(s) within the areas targeted by this assay, and inadequate number of viral copies(<138 copies/mL). A negative result must be combined with clinical observations, patient history, and epidemiological information. The expected result is Negative.  Fact Sheet for Patients:  EntrepreneurPulse.com.au  Fact Sheet for Healthcare Providers:  IncredibleEmployment.be  This test is no t yet approved or cleared by the [place] FDA and  has been authorized for detection and/or diagnosis of SARS-CoV-2 by FDA under an Emergency Use Authorization (EUA). This EUA will remain  in effect (meaning this test can be used) for the duration of the COVID-19 declaration under Section 564(b)(1) of the Act, 21 U.S.C.section 360bbb-3(b)(1), unless the authorization is terminated  or revoked sooner.       Influenza A by PCR NEGATIVE NEGATIVE Final   Influenza B by PCR NEGATIVE NEGATIVE Final    Comment: (NOTE) The Xpert Xpress SARS-CoV-2/FLU/RSV plus assay is intended as an aid in the diagnosis of influenza from Nasopharyngeal swab specimens and should not be used as a sole basis for treatment. Nasal washings and aspirates are unacceptable for Xpert Xpress SARS-CoV-2/FLU/RSV testing.  Fact Sheet for Patients: EntrepreneurPulse.com.au  Fact Sheet for Healthcare Providers: IncredibleEmployment.be  This test is not yet approved or cleared by the [place] FDA and has been authorized for detection and/or diagnosis of SARS-CoV-2 by FDA under an Emergency Use Authorization (EUA). This EUA will remain in effect (meaning this test can be used) for the duration of the COVID-19 declaration under Section 564(b)(1) of the Act, 21 U.S.C. section 360bbb-3(b)(1), unless the authorization is terminated or revoked.  Performed at [facility], [place] [address] [place], [place] [address]   Culture, blood (Routine x 2)     Status: Abnormal   Collection Time: 07/21/21  8:00 PM   Specimen: BLOOD  Result Value Ref Range Status   Specimen Description BLOOD RIGHT ANTECUBITAL  Final   Special Requests   Final    BOTTLES DRAWN AEROBIC AND ANAEROBIC Blood Culture adequate volume   Culture  Setup Time   Final    GRAM POSITIVE COCCI IN CHAINS IN BOTH AEROBIC AND ANAEROBIC BOTTLES    Culture (A)  Final    GROUP B STREP(S.AGALACTIAE)ISOLATED SUSCEPTIBILITIES PERFORMED ON PREVIOUS CULTURE WITHIN THE LAST 5 DAYS. Performed at [name], [place] [address], [place], [place] [address]    Report Status 07/25/2021 FINAL  Final  Culture, blood (Routine x 2)     Status: Abnormal   Collection Time: 07/21/21  8:03 PM   Specimen: BLOOD  Result Value Ref Range Status   Specimen Description BLOOD LEFT ARM  Final   Special Requests   Final    BOTTLES DRAWN AEROBIC AND ANAEROBIC Blood Culture adequate volume   Culture  Setup Time   Final    GRAM POSITIVE COCCI IN CHAINS IN BOTH AEROBIC AND ANAEROBIC BOTTLES CRITICAL RESULT CALLED TO, READ BACK BY AND VERIFIED WITH: PHARMD B BOLDEN [id number] AT 830 AM BY CM Performed at [facility], [place] [address] [place], [place] [address]    Culture GROUP B STREP(S.AGALACTIAE)ISOLATED (A)  Final   Report Status 07/25/2021 FINAL  Final   Organism ID, Bacteria GROUP B STREP(S.AGALACTIAE)ISOLATED  Final      Susceptibility   Group  b strep(s.agalactiae)isolated - MIC*    CLINDAMYCIN <=0.25 SENSITIVE Sensitive      AMPICILLIN <=0.25 SENSITIVE Sensitive     ERYTHROMYCIN <=0.12 SENSITIVE Sensitive     VANCOMYCIN 0.5 SENSITIVE Sensitive     CEFTRIAXONE <=0.12 SENSITIVE Sensitive     LEVOFLOXACIN 0.5 SENSITIVE Sensitive     * GROUP B STREP(S.AGALACTIAE)ISOLATED  Blood Culture ID Panel (Reflexed)     Status: Abnormal   Collection Time: 07/21/21  8:03 PM  Result Value Ref Range Status   Enterococcus faecalis NOT DETECTED NOT DETECTED Final   Enterococcus Faecium NOT DETECTED NOT DETECTED Final   Listeria monocytogenes NOT DETECTED NOT DETECTED Final   Staphylococcus species NOT DETECTED NOT DETECTED Final   Staphylococcus aureus (BCID) NOT DETECTED NOT DETECTED Final   Staphylococcus epidermidis NOT DETECTED NOT DETECTED Final   Staphylococcus lugdunensis NOT DETECTED NOT DETECTED Final   Streptococcus species DETECTED (A) NOT DETECTED Final    Comment: CRITICAL RESULT CALLED TO, READ BACK BY AND VERIFIED WITH: PHARMD B BOLDEN [id number] AT 923 AM BY CM    Streptococcus agalactiae DETECTED (A) NOT DETECTED Final    Comment: CRITICAL RESULT CALLED TO, READ BACK BY AND VERIFIED WITH: PHARMD B BOLDEN [id number] AT 923 AM BY CM    Streptococcus pneumoniae NOT DETECTED NOT DETECTED Final   Streptococcus pyogenes NOT DETECTED NOT DETECTED Final   A.calcoaceticus-baumannii NOT DETECTED NOT DETECTED Final   Bacteroides fragilis NOT DETECTED NOT DETECTED Final   Enterobacterales NOT DETECTED NOT DETECTED Final   Enterobacter cloacae complex NOT DETECTED NOT DETECTED Final   Escherichia coli NOT DETECTED NOT DETECTED Final   Klebsiella aerogenes NOT DETECTED NOT DETECTED Final   Klebsiella oxytoca NOT DETECTED NOT DETECTED Final   Klebsiella pneumoniae NOT DETECTED NOT DETECTED Final   Proteus species NOT DETECTED NOT DETECTED Final   Salmonella species NOT DETECTED NOT DETECTED Final   Serratia marcescens NOT DETECTED NOT DETECTED Final   Haemophilus influenzae NOT DETECTED NOT DETECTED Final   Neisseria  meningitidis NOT DETECTED NOT DETECTED Final   Pseudomonas aeruginosa NOT DETECTED NOT DETECTED Final   Stenotrophomonas maltophilia NOT DETECTED NOT DETECTED Final   Candida albicans NOT DETECTED NOT DETECTED Final   Candida auris NOT DETECTED NOT DETECTED Final   Candida glabrata NOT DETECTED NOT DETECTED Final   Candida krusei NOT DETECTED NOT DETECTED Final   Candida parapsilosis NOT DETECTED NOT DETECTED Final   Candida tropicalis NOT DETECTED NOT DETECTED Final   Cryptococcus neoformans/gattii NOT DETECTED NOT DETECTED Final    Comment: Performed at [facility], [address], [place], [place] [address]  Urine Culture     Status: Abnormal   Collection Time: 07/22/21  3:26 AM   Specimen: In/Out Cath Urine  Result Value Ref Range Status   Specimen Description IN/OUT CATH URINE  Final   Special Requests NONE  Final   Culture (A)  Final    30,000 COLONIES/mL STREPTOCOCCUS AGALACTIAE TESTING AGAINST S. AGALACTIAE NOT ROUTINELY PERFORMED DUE TO PREDICTABILITY OF AMP/PEN/VAN SUSCEPTIBILITY. Performed at [facility], [place] [address] [place],  [address]    Report Status 07/23/2021 FINAL  Final  MRSA Next Gen by PCR, Nasal     Status: Abnormal   Collection Time: 07/22/21  5:02 PM   Specimen: Nasal Mucosa; Nasal Swab  Result Value Ref Range Status   MRSA by PCR Next Gen DETECTED (A) NOT DETECTED Final    Comment: RESULT CALLED TO, READ BACK BY AND VERIFIED  WITH: ASHLEY ORE RN 07/22/2021 @2051  BY JW (NOTE) The GeneXpert MRSA Assay (FDA approved for NASAL specimens only), is one component of a comprehensive MRSA colonization surveillance program. It is not intended to diagnose MRSA infection nor to guide or monitor treatment for MRSA infections. Test performance is not FDA approved in patients less than 50 years old. Performed at [facility], [place] [address] [place], [place] [address]   Surgical pcr screen     Status: Abnormal   Collection Time: 07/22/21   8:53 PM   Specimen: Nasal Mucosa; Nasal Swab  Result Value Ref Range Status   MRSA, PCR POSITIVE (A) NEGATIVE Final    Comment: RESULT CALLED TO, READ BACK BY AND VERIFIED WITH: A ORR,RN@2230  07/22/21 [place]    Staphylococcus aureus POSITIVE (A) NEGATIVE Final    Comment: (NOTE) The Xpert SA Assay (FDA approved for NASAL specimens in patients 50 years of age and older), is one component of a comprehensive surveillance program. It is not intended to diagnose infection nor to guide or monitor treatment. Performed at [facility], [place] [address] [place], [place] [address]   Culture, blood (routine x 2)     Status: None (Preliminary result)   Collection Time: 07/24/21 12:02 AM   Specimen: BLOOD  Result Value Ref Range Status   Specimen Description BLOOD RIGHT ARM  Final   Special Requests   Final    BOTTLES DRAWN AEROBIC AND ANAEROBIC Blood Culture adequate volume   Culture   Final    NO GROWTH 2 DAYS Performed at [facility], [place] [address], [place], [place] [address]    Report Status PENDING  Incomplete  Culture, blood (routine x 2)     Status: None (Preliminary result)   Collection Time: 07/24/21 12:02 AM   Specimen: BLOOD  Result Value Ref Range Status   Specimen Description BLOOD RIGHT ARM  Final   Special Requests   Final    BOTTLES DRAWN AEROBIC AND ANAEROBIC Blood Culture adequate volume   Culture   Final    NO GROWTH 2 DAYS Performed at [facility], [place] [address], [place], [place] [address]    Report Status PENDING  Incomplete         Radiology Studies: MR CERVICAL SPINE WO CONTRAST  Result Date: 07/25/2021 CLINICAL DATA:  Back pain and bacteremia EXAM: MRI CERVICAL SPINE WITHOUT CONTRAST TECHNIQUE: Multiplanar, multisequence MR imaging of the cervical spine was performed. No intravenous contrast was administered. COMPARISON:  None. FINDINGS: The examination is markedly degraded by motion. Alignment: Physiologic. Vertebrae: No fracture,  evidence of discitis, or bone lesion. Cord: Normal signal and morphology. Posterior Fossa, vertebral arteries, paraspinal tissues: Negative. No epidural collection is visible. Disc levels: Limited assessment of the neural foramina due to the degree of motion. No high-grade spinal canal stenosis. IMPRESSION: Markedly motion degraded examination. Within that limitation, no evidence of discitis-osteomyelitis or epidural collection. Electronically Signed   By: Ulyses Jarred M.D.   On: 07/25/2021 18:59        Scheduled Meds:  acetaminophen  650 mg Oral Q6H   amiodarone  200 mg Oral BID   apixaban  5 mg Oral BID   vitamin C  1,000 mg Oral Daily   docusate sodium  100 mg Oral Daily   gabapentin  300 mg Oral TID   insulin aspart  0-15 Units Subcutaneous TID WC   insulin aspart  0-5 Units Subcutaneous QHS   insulin aspart  3 Units Subcutaneous TID WC   insulin detemir  25 Units Subcutaneous BID   metoprolol succinate  12.5 mg Oral Daily   mupirocin ointment  1 application Nasal BID   nutrition supplement (JUVEN)  1 packet Oral BID BM   polyethylene glycol  17 g Oral Daily   zinc sulfate  220 mg Oral Daily   Continuous Infusions:  sodium chloride     lactated ringers Stopped (07/23/21 1013)   magnesium sulfate bolus IVPB     penicillin g continuous IV infusion 8 Million Units (07/26/21 2258)          Aline August, MD [facility] 07/27/2021, 10:21 AM

## 2021-07-27 NOTE — Progress Notes (Signed)
Per Dr. Nelly Laurence request, sent msg to cardmaster box to see if we can schedule TEE/DCCV tomorrow for bacteremia/atrial flutter. I sent msg to IM requesting their review of diabetic meds since pt will be NPO after midnight.

## 2021-07-27 NOTE — Progress Notes (Signed)
Pt refuses CPAP 

## 2021-07-27 NOTE — Progress Notes (Signed)
[name] for Infectious Disease  Date of Admission:  07/21/2021   Total days of inpatient antibiotics 6  Principal Problem:   Severe sepsis with acute organ dysfunction (HCC) Active Problems:   Essential hypertension   Stage 3a chronic kidney disease (HCC)   Type 2 diabetes mellitus with diabetic polyneuropathy, with long-term current use of insulin (HCC)   Right below-knee amputee (HCC)   Diabetic infection of left foot (HCC)   Chronic diastolic (congestive) heart failure (HCC)   Chest pain   AKI (acute kidney injury) ([place])   Gas gangrene of foot (HCC)   Atrial flutter (HCC)   Septic shock (HCC)   Systolic dysfunction   Pressure injury of skin          54 year old male with poorly controlled diabetes, A1c 13.8, status post right BKA admitted for diabetic foot ulcer osteomyelitis now status post BKA.  Infectious disease consulted at blood cultures grew group B strep.  MRI w/o contrast ordered which showed possible T6-T8 bone marrow edema consistent with possible discitis/osteomyelitis.  .   #GBS bacteremia #DFU SP BKA #Thoracic OM -TTE showed no vegetation. Denies IVDA -MRC-spine did not show signs of abscess Recommendations: -Continue Penicillin -Anticipate 8 weeks of antibiotics of vertebral OM -Would obtain TEE -Follow repeat Cx from 11/16 - Neurosurgery consult as MRI was without contrast  Microbiology:   Antibiotics: Currently on penicillin Cultures: 11/14 GBS 11/16 NGTD SUBJECTIVE: Continues to have back pain. Afebrile overnight.   Interval: wbc 16k. Review of Systems: ROS   Scheduled Meds:  amiodarone  200 mg Oral BID   apixaban  5 mg Oral BID   vitamin C  1,000 mg Oral Daily   docusate sodium  100 mg Oral Daily   gabapentin  300 mg Oral TID   insulin aspart  0-15 Units Subcutaneous TID WC   insulin aspart  0-5 Units Subcutaneous QHS   insulin aspart  3 Units Subcutaneous TID WC   insulin detemir  15 Units Subcutaneous BID    [START ON 07/28/2021] metoprolol succinate  25 mg Oral Daily   mupirocin ointment  1 application Nasal BID   nutrition supplement (JUVEN)  1 packet Oral BID BM   polyethylene glycol  17 g Oral Daily   zinc sulfate  220 mg Oral Daily   Continuous Infusions:  sodium chloride     lactated ringers Stopped (07/23/21 1013)   magnesium sulfate bolus IVPB     penicillin g continuous IV infusion 8 Million Units (07/27/21 1228)   PRN Meds:.acetaminophen, alum & mag hydroxide-simeth, bisacodyl, dextrose, diphenhydrAMINE, guaiFENesin-dextromethorphan, hydrALAZINE, HYDROmorphone (DILAUDID) injection, labetalol, magnesium sulfate bolus IVPB, metoprolol tartrate, ondansetron, oxyCODONE, phenol, polyethylene glycol, potassium chloride Allergies  Allergen Reactions   Bactrim [Sulfamethoxazole-Trimethoprim]    Ceprotin [Protein C Concentrate (Human)]    Ciprofloxacin Other (See Comments)    Kidney function   Levaquin [Levofloxacin]     OBJECTIVE: Vitals:   07/27/21 0252 07/27/21 0753 07/27/21 1217 07/27/21 1459  BP: 129/66 132/80 136/71 124/76  Pulse: (!) 47 68 (!) 42 98  Resp: (!) 22 18 18    Temp: 98.7 F (37.1 C) 98.5 F (36.9 C) 99.1 F (37.3 C)   TempSrc: Axillary Oral Axillary   SpO2: 95% 96% 95%   Weight: 108.2 kg     Height: 6\' 3"  (1.905 m)      Body mass index is 29.82 kg/m.  Physical Exam Constitutional:      Appearance: He is normal  weight. He is not toxic-appearing.     Comments: Reports low back pain  HENT:     Head: Normocephalic and atraumatic.     Right Ear: External ear normal.     Left Ear: External ear normal.     Nose: No congestion or rhinorrhea.     Mouth/Throat:     Mouth: Mucous membranes are moist.     Pharynx: Oropharynx is clear.  Eyes:     Extraocular Movements: Extraocular movements intact.     Conjunctiva/sclera: Conjunctivae normal.     Pupils: Pupils are equal, round, and reactive to light.  Cardiovascular:     Rate and Rhythm: Normal rate and  regular rhythm.     Heart sounds: No murmur heard.   No friction rub. No gallop.  Pulmonary:     Effort: Pulmonary effort is normal.     Breath sounds: Normal breath sounds.  Abdominal:     General: Abdomen is flat. Bowel sounds are normal.     Palpations: Abdomen is soft.  Musculoskeletal:        General: No swelling. Normal range of motion.     Cervical back: Normal range of motion and neck supple.  Skin:    General: Skin is warm and dry.  Neurological:     General: No focal deficit present.     Mental Status: He is oriented to person, place, and time.  Psychiatric:        Mood and Affect: Mood normal.      Lab Results Lab Results  Component Value Date   WBC 16.0 (H) 07/27/2021   HGB 15.1 07/27/2021   HCT 45.4 07/27/2021   MCV 84.2 07/27/2021   PLT 140 (L) 07/27/2021    Lab Results  Component Value Date   CREATININE 2.25 (H) 07/27/2021   BUN 78 (H) 07/27/2021   NA 130 (L) 07/27/2021   K 3.7 07/27/2021   CL 100 07/27/2021   CO2 22 07/27/2021    Lab Results  Component Value Date   ALT 40 07/21/2021   AST 28 07/21/2021   ALKPHOS 242 (H) 07/21/2021   BILITOT 1.0 07/21/2021        Laurice Record, [facility] Infectious [facility] 07/27/2021, 3:23 PM

## 2021-07-27 NOTE — Plan of Care (Addendum)
#  GBS bacteremia #Thoracic Osteomyelitis(OM)  -MRI cervical lumbar and thoracic without contrast due to renal injury -NSY consulted and no plans for surgery.  Reccs: -Would treat with 2 weeks of parenteral therapy with penicillin form 07/23/21(if Cx remain negative)-08/05/21 then transition to amoxicillin 1gm tid(EOT 09/14/21) to complete 8 weeks of antibiotics (OM) -Agree with MRI w contrast if able to do so -Pt needs TEE as he has gram positive bacteremia and likely OM seen on imaging is evidence of embolic disease

## 2021-07-28 ENCOUNTER — Other Ambulatory Visit (HOSPITAL_COMMUNITY): Payer: Self-pay

## 2021-07-28 DIAGNOSIS — L089 Local infection of the skin and subcutaneous tissue, unspecified: Secondary | ICD-10-CM | POA: Diagnosis not present

## 2021-07-28 DIAGNOSIS — R6521 Severe sepsis with septic shock: Secondary | ICD-10-CM | POA: Diagnosis not present

## 2021-07-28 DIAGNOSIS — R6520 Severe sepsis without septic shock: Secondary | ICD-10-CM | POA: Diagnosis not present

## 2021-07-28 DIAGNOSIS — A419 Sepsis, unspecified organism: Secondary | ICD-10-CM | POA: Diagnosis not present

## 2021-07-28 DIAGNOSIS — E11628 Type 2 diabetes mellitus with other skin complications: Secondary | ICD-10-CM | POA: Diagnosis not present

## 2021-07-28 DIAGNOSIS — A480 Gas gangrene: Secondary | ICD-10-CM | POA: Diagnosis not present

## 2021-07-28 DIAGNOSIS — I4892 Unspecified atrial flutter: Secondary | ICD-10-CM | POA: Diagnosis not present

## 2021-07-28 DIAGNOSIS — N179 Acute kidney failure, unspecified: Secondary | ICD-10-CM | POA: Diagnosis not present

## 2021-07-28 DIAGNOSIS — I519 Heart disease, unspecified: Secondary | ICD-10-CM | POA: Diagnosis not present

## 2021-07-28 LAB — CBC
HCT: 42.2 % (ref 39.0–52.0)
Hemoglobin: 14.1 g/dL (ref 13.0–17.0)
MCH: 27.8 pg (ref 26.0–34.0)
MCHC: 33.4 g/dL (ref 30.0–36.0)
MCV: 83.2 fL (ref 80.0–100.0)
Platelets: 223 10*3/uL (ref 150–400)
RBC: 5.07 MIL/uL (ref 4.22–5.81)
RDW: 15.7 % — ABNORMAL HIGH (ref 11.5–15.5)
WBC: 17.1 10*3/uL — ABNORMAL HIGH (ref 4.0–10.5)
nRBC: 0 % (ref 0.0–0.2)

## 2021-07-28 LAB — BASIC METABOLIC PANEL
Anion gap: 7 (ref 5–15)
BUN: 59 mg/dL — ABNORMAL HIGH (ref 6–20)
CO2: 24 mmol/L (ref 22–32)
Calcium: 8 mg/dL — ABNORMAL LOW (ref 8.9–10.3)
Chloride: 101 mmol/L (ref 98–111)
Creatinine, Ser: 1.75 mg/dL — ABNORMAL HIGH (ref 0.61–1.24)
GFR, Estimated: 45 mL/min — ABNORMAL LOW (ref >60)
Glucose, Bld: 252 mg/dL — ABNORMAL HIGH (ref 70–99)
Potassium: 4.3 mmol/L (ref 3.5–5.1)
Sodium: 132 mmol/L — ABNORMAL LOW (ref 135–145)

## 2021-07-28 LAB — GC/CHLAMYDIA PROBE AMP (~~LOC~~) NOT AT ARMC
Chlamydia: NEGATIVE
Comment: NEGATIVE
Comment: NORMAL
Neisseria Gonorrhea: NEGATIVE

## 2021-07-28 LAB — GLUCOSE, CAPILLARY
Glucose-Capillary: 174 mg/dL — ABNORMAL HIGH (ref 70–99)
Glucose-Capillary: 157 mg/dL — ABNORMAL HIGH (ref 70–99)
Glucose-Capillary: 227 mg/dL — ABNORMAL HIGH (ref 70–99)
Glucose-Capillary: 267 mg/dL — ABNORMAL HIGH (ref 70–99)

## 2021-07-28 LAB — PROTIME-INR
INR: 1.6 — ABNORMAL HIGH (ref 0.8–1.2)
Prothrombin Time: 18.6 seconds — ABNORMAL HIGH (ref 11.4–15.2)

## 2021-07-28 LAB — MAGNESIUM: Magnesium: 1.9 mg/dL (ref 1.7–2.4)

## 2021-07-28 MED ORDER — PENICILLIN G POTASSIUM 20000000 UNITS IJ SOLR
12.0000 10*6.[IU] | Freq: Two times a day (BID) | INTRAVENOUS | Status: DC
  Administered 2021-07-28 – 2021-08-05 (×16): 12 10*6.[IU] via INTRAVENOUS
  Filled 2021-07-28 (×16): qty 12
  Filled 2021-07-28: qty 5
  Filled 2021-07-28 (×3): qty 12

## 2021-07-28 MED ORDER — SODIUM CHLORIDE 0.9 % IV SOLN: INTRAVENOUS | Status: DC

## 2021-07-28 NOTE — Care Management Important Message (Signed)
Important Message  Patient Details  Name: [name] MRN: [id number] Date of Birth: [birth year]   Medicare Important Message Given:  Yes     Shelda Altes 07/28/2021, 10:16 AM

## 2021-07-28 NOTE — Progress Notes (Addendum)
HOSPITAL MEDICINE OVERNIGHT EVENT NOTE    Nursing reports that patient had an episode of feeling like he needed to have a bowel movement followed by going into rapid atrial flutter with heart rates in the 130's to 140's.  Currently, patient is sleeping with no complaints of SOB or chest pain.    Patient has already received 2mg  of IV Metoprolol by a standing order with no effect on HR.  There is an additional dose of 5mg  IV Metoprolol available via standing order and I have asked nursing to go ahead and administer it.  BP currently stable at 152/80.  Will monitor closely.    [name] Emerald  MD [facility]   ADDENDUM 5:40am   Heart rate decreased to 110 range, similar rate control to prior.  Patient to undergo TEE and Cardioversion later today.    Sherryll Burger Lianna Sitzmann

## 2021-07-28 NOTE — Progress Notes (Signed)
\  PHARMACY NOTE:  ANTIMICROBIAL RENAL DOSAGE ADJUSTMENT  Current antimicrobial regimen includes a mismatch between antimicrobial dosage and estimated renal function.  As per policy approved by the Pharmacy & Therapeutics and Medical Executive Committees, the antimicrobial dosage will be adjusted accordingly.  Current antimicrobial dosage:  Penicillin G 8 MU CI every 12 hours  Indication: GBS bacteremia  Renal Function:  Estimated Creatinine Clearance: 63.4 mL/min (A) (by C-G formula based on SCr of 1.75 mg/dL (H)). []      On intermittent HD, scheduled: []      On CRRT    Antimicrobial dosage has been changed to: Penicillin G 12 MU CI every 12 hours  Additional comments:  Thank you for allowing pharmacy to be a part of this patient's care.  Alycia Rossetti, PharmD, BCPS Clinical Pharmacist 07/28/2021 8:48 AM   **Pharmacist phone directory can now be found on [email or web address] ([email or web address]).  Listed under [place].

## 2021-07-28 NOTE — Progress Notes (Signed)
Physical Therapy Treatment Patient Details Name: [name] MRN: [id number] DOB: 1965/09/23 Today's Date: 07/28/2021   History of Present Illness 55 yo admitted 11/14 with infected left foot s/p Left BKA 11/16. Pt with T6-8 discitis. PMhx: Rt BKA, Aflutter, DM, depression and anxiety    PT Comments    Pt reporting 10/10 pain in back on entry despite premedication. Pt very agitated by fact that he is not having TEE today adding to overall pt stress. Pt starts UE exercise with further exacerbation of pain. PT attempting breath techniques to engage parasympathetic nervous system reducing tension and increased pain. Pt initially performs pursed lip breathing with decrease in HR and RR, however unable to perform exercise while maintaining breathing pattern. Pt becomes agitated as further relaxation techniques used, returning to need for additional pain medication to be able to move. Educated pt on need to move as he is at higher risk for pressure injury. Recommended air mattress for wound prevention. D/c plans remain appropriate if pt pain management can be optimized as pt's desire is to return to independence. PT will continue to follow acutely.     Recommendations for follow up therapy are one component of a multi-disciplinary discharge planning process, led by the attending physician.  Recommendations may be updated based on patient status, additional functional criteria and insurance authorization.  Follow Up Recommendations  Acute inpatient rehab (3hours/day)     Assistance Recommended at Discharge Frequent or constant Supervision/Assistance  Equipment Recommendations  Wheelchair (measurements PT);Wheelchair cushion (measurements PT)       Precautions / Restrictions Precautions Precautions: Fall;Back Precaution Comments: for comfort Required Braces or Orthoses: Other Brace Other Brace: LLE limb protector Restrictions Weight Bearing Restrictions: No     Mobility  Bed Mobility                General bed mobility comments: pt refused due to pain    Transfers                   General transfer comment: unable              Cognition Arousal/Alertness: Awake/alert Behavior During Therapy: Agitated Overall Cognitive Status: Impaired/Different from baseline Area of Impairment: Awareness;Problem solving                           Awareness: Emergent Problem Solving: Requires verbal cues General Comments: pt self limiting, decreased awareness        Exercises General Exercises - Upper Extremity Shoulder Flexion: AROM;Both;10 reps;Supine    General Comments General comments (skin integrity, edema, etc.): HR and RR increased with movement, breathwork and relaxation techniqes able to reduce both HR and RR however pt becomes agitated when asked to work on breathing with exercise      Pertinent Vitals/Pain Pain Assessment: 0-10 Pain Score: 10-Worst pain ever Pain Location: left mid thoracic Pain Descriptors / Indicators: Constant;Stabbing Pain Intervention(s): Limited activity within patient's tolerance;Monitored during session;Premedicated before session;Utilized relaxation techniques;Relaxation     PT Goals (current goals can now be found in the care plan section) Acute Rehab PT Goals PT Goal Formulation: With patient Time For Goal Achievement: 08/07/21 Potential to Achieve Goals: Fair Progress towards PT goals: Not progressing toward goals - comment (limited by pain)    Frequency    Min 3X/week      PT Plan Current plan remains appropriate    Co-evaluation PT/OT/SLP Co-Evaluation/Treatment: Yes Reason for Co-Treatment: For patient/therapist safety PT goals addressed  during session: Mobility/safety with mobility OT goals addressed during session: ADL's and self-care      AM-PAC PT "6 Clicks" Mobility   Outcome Measure  Help needed turning from your back to your side while in a flat bed without using  bedrails?: Total Help needed moving from lying on your back to sitting on the side of a flat bed without using bedrails?: Total Help needed moving to and from a bed to a chair (including a wheelchair)?: Total Help needed standing up from a chair using your arms (e.g., wheelchair or bedside chair)?: Total Help needed to walk in hospital room?: Total Help needed climbing 3-5 steps with a railing? : Total 6 Click Score: 6    End of Session   Activity Tolerance: Patient limited by pain Patient left: in bed;with call bell/phone within reach;with bed alarm set Nurse Communication: Mobility status;Need for lift equipment PT Visit Diagnosis: Other abnormalities of gait and mobility (R26.89);Muscle weakness (generalized) (M62.81)     Time: 7793-9030 PT Time Calculation (min) (ACUTE ONLY): 30 min  Charges:  $Neuromuscular Re-education: 8-22 mins                     [name] B. Migdalia Dk PT, DPT Acute Rehabilitation Services Pager [phone] Office [phone]    [place] 07/28/2021, 2:56 PM

## 2021-07-28 NOTE — TOC Benefit Eligibility Note (Signed)
Patient Teacher, English as a foreign language completed.    The patient is currently admitted and upon discharge could be taking Jardiance, Eliquis & Entresto.  The current 30 day co-pay is $45.   The patient is currently admitted and upon discharge could be taking [place].  The current 30 day co-pay is $95.   The patient is insured through [employer].  Sharlette Dense, CPhT Pharmacy Patient Advocate Specialist [place] Patient Advocate Team Direct Number: [phone]  Fax: [phone]

## 2021-07-28 NOTE — Progress Notes (Signed)
[name], male   DOB: October 29, 1965, 55 y.o.   MRN: [id number]  PROGRESS NOTE    Reeder Brisby  WJX:[id number] DOB: 1965-11-23 DOA: 07/21/2021 PCP: Patient, No Pcp Per (Inactive)   Brief Narrative:  55 year old male with history of diabetes mellitus type 2, diabetic foot infection with prior right BKA, chronic diastolic CHF, hypertension, dyslipidemia presented with worsening left foot swelling/discoloration and discharge.  On presentation, he was hypotensive, tachycardic with A. fib with RVR with CT suggestive of first MTP osteomyelitis and soft tissue gas concerning for possible necrotizing fasciitis along with AKI with creatinine of 2.7 and mild DKA.  His blood pressures did not improve with IV fluids and he was transferred to ICU and PCCM was consulted.  Orthopedics was consulted.  He underwent left transtibial amputation on 07/23/2021.  He was found to have strep agalactiae bacteremia.  ID was consulted.  He was found to have vertebral discitis versus osteomyelitis on MRI.  He was also found to have new systolic dysfunction with LVEF of 35% along with new onset atrial flutter requiring IV amiodarone which has been switched to oral.  Cardiology was consulted.  He has been started on Eliquis.  Cardiology is planning for TEE/DCCV likely early this week.  Assessment & Plan:   Septic shock: Evolving on admission from bacteremia/diabetic foot infection -Off pressors.  Currently hemodynamically stable. -Off IV fluids.  Antibiotics as below. -Required care in ICU under PCCM service: Care has been transferred to [facility] from 07/27/2021 onwards  Diabetic foot infection  -Status post left transtibial amputation on 07/23/2021.  Wound/wound VAC care as per Dr. Sharol Given.  Group B streptococcus bacteremia Possible thoracic discitis/osteomyelitis -TTE showed no vegetation.  Cardiology is planning for TEE possibly early this week. -MRI of cervical/thoracic/lumbar spine showed possible thoracic  osteomyelitis/discitis without evidence of abscess but MRI was without contrast.  Neurosurgery evaluation appreciated: Not convinced that patient has epidural abscess.  Recommended to manage conservatively with IV antibiotics. -Antibiotics have been changed to penicillin.  Patient will probably need 8 weeks of antibiotics per ID (2 weeks of parenteral therapy with penicillin from 07/23/2021-08/05/2021; then transition to amoxicillin 1 g 3 times daily till 09/14/2021). -Repeat blood cultures from 07/24/2021 have been negative so far  AKI on CKD stage IV Acute metabolic acidosis -Renal function improving.  Creatinine improving to 1.75 today.  Off IV fluids.  Monitor -Serum bicarbonate has improved  Hyponatremia -Improving.  Monitor  New systolic dysfunction in a patient with chronic diastolic heart failure -Echo showed EF of 35% (previous echo showed EF of 50 to 55% in 04/2021) -Strict input output.  Daily weights.  Fluid restriction.  Continue conservative management.  Continue metoprolol succinate. -Cardiology following.  New onset atrial flutter -Currently tachycardic.  IV amiodarone has been switched to oral amiodarone.  Continue Eliquis and metoprolol succinate.  Cardiology planning for TEE DCCV early this week.  Leukocytosis -Still significant.  Monitor  Diabetes mellitus type 2 with hyperglycemia -Continue Lantus along with CBGs with SSI  Thrombocytopenia -Resolved    DVT prophylaxis: Apixaban Code Status: Full Family Communication: None at bedside Disposition Plan: Status is: Inpatient  Remains inpatient appropriate because: Of need for IV antibiotics.  TEE/DCCV planned for early this week  Consultants: Cardiology/orthopedic/ID/neurosurgery/PCCM/neurosurgery  Procedures: Left transtibial amputation on 07/23/2021.  Echo  Antimicrobials:  Anti-infectives (From admission, onward)    Start     Dose/Rate Route Frequency Ordered Stop   07/25/21 1000  penicillin G potassium  8 Million Units in dextrose 5 %  500 mL continuous infusion        8 Million Units 41.7 mL/hr over 12 Hours Intravenous Every 12 hours 07/25/21 0854     07/23/21 1345  cefTRIAXone (ROCEPHIN) 2 g in sodium chloride 0.9 % 100 mL IVPB  Status:  Discontinued        2 g 200 mL/hr over 30 Minutes Intravenous Every 24 hours 07/23/21 1246 07/25/21 0854   07/23/21 0815  ceFAZolin (ANCEF) IVPB 2g/100 mL premix  Status:  Discontinued        2 g 200 mL/hr over 30 Minutes Intravenous To ShortStay Surgical 07/23/21 0622 07/23/21 0836   07/22/21 2000  linezolid (ZYVOX) IVPB 600 mg  Status:  Discontinued        600 mg 300 mL/hr over 60 Minutes Intravenous Every 12 hours 07/22/21 1544 07/23/21 1349   07/22/21 0800  piperacillin-tazobactam (ZOSYN) IVPB 3.375 g  Status:  Discontinued        3.375 g 12.5 mL/hr over 240 Minutes Intravenous Every 8 hours 07/22/21 0107 07/23/21 1246   07/22/21 0130  clindamycin (CLEOCIN) IVPB 600 mg  Status:  Discontinued        600 mg 100 mL/hr over 30 Minutes Intravenous Every 8 hours 07/22/21 0101 07/22/21 1544   07/22/21 0115  piperacillin-tazobactam (ZOSYN) IVPB 3.375 g        3.375 g 100 mL/hr over 30 Minutes Intravenous STAT 07/22/21 0107 07/22/21 0223   07/21/21 2335  vancomycin variable dose per unstable renal function (pharmacist dosing)  Status:  Discontinued         Does not apply See admin instructions 07/21/21 2335 07/22/21 1544   07/21/21 2000  vancomycin (VANCOREADY) IVPB 2000 mg/400 mL        2,000 mg 200 mL/hr over 120 Minutes Intravenous  Once 07/21/21 1955 07/22/21 0004   07/21/21 2000  cefTRIAXone (ROCEPHIN) 2 g in sodium chloride 0.9 % 100 mL IVPB        2 g 200 mL/hr over 30 Minutes Intravenous  Once 07/21/21 1955 07/21/21 2130        Subjective: Patient seen and examined at bedside.  No overnight fever, abdominal pain or diarrhea reported.  Still complains of lower back pain.  Denies chest pain. Objective: Vitals:   07/27/21 1949 07/28/21 0000  07/28/21 0324 07/28/21 0752  BP: 132/73 127/75 (!) 152/80 134/86  Pulse: 60 60 (!) 138 68  Resp: 18 16 20 20   Temp: 99.5 F (37.5 C) 98.7 F (37.1 C) 98.2 F (36.8 C) 99.7 F (37.6 C)  TempSrc: Axillary Oral Oral Axillary  SpO2: 99% 99% 96% 95%  Weight:      Height:        Intake/Output Summary (Last 24 hours) at 07/28/2021 0754 Last data filed at 07/28/2021 0751 Gross per 24 hour  Intake 480 ml  Output 3655 ml  Net -3175 ml    Filed Weights   07/22/21 0015 07/23/21 0827 07/27/21 0252  Weight: 108.9 kg 108.9 kg 108.2 kg    Examination:  General exam: No distress.  On 3 L oxygen via nasal cannula; looks chronically ill and deconditioned.   Respiratory system: Decreased breath sounds at bases bilaterally with crackles  cardiovascular system: Tachycardic; S1-S2 heard Gastrointestinal system: Abdomen is distended slightly, soft and nontender.  Bowel sounds are heard  extremities: Left BKA with amputation brace present; right lower extremity BKA present Central nervous system: Awake and alert.  No obvious focal neurologic deficits  skin: No obvious petechiae/rashes Psychiatry: Affect  is flat. Intermittently gets angry  Data Reviewed: I have personally reviewed following labs and imaging studies  CBC: Recent Labs  Lab 07/21/21 2003 07/21/21 2249 07/24/21 0257 07/25/21 0154 07/26/21 0055 07/27/21 0126 07/28/21 0055  WBC 13.0*   < > 19.0* 15.0* 13.4* 16.0* 17.1*  NEUTROABS 12.2*  --   --   --   --   --   --   HGB 17.9*   < > 14.5 14.1 14.2 15.1 14.1  HCT 53.2*   < > 42.6 41.1 40.7 45.4 42.2  MCV 84.4   < > 82.7 83.0 81.7 84.2 83.2  PLT 141*   < > 89* 102* 85* 140* 223   < > = values in this interval not displayed.    Basic Metabolic Panel: Recent Labs  Lab 07/24/21 0257 07/25/21 0154 07/26/21 0055 07/27/21 0126 07/28/21 0055  NA 125* 127* 127* 130* 132*  K 4.2 3.8 3.7 3.7 4.3  CL 97* 98 100 100 101  CO2 17* 19* 19* 22 24  GLUCOSE 298* 181* 157* 255*  252*  BUN 76* 94* 94* 78* 59*  CREATININE 3.29* 3.23* 2.83* 2.25* 1.75*  CALCIUM 7.8* 7.9* 7.7* 7.9* 8.0*  MG 2.1 2.3 2.2 2.2 1.9    GFR: Estimated Creatinine Clearance: 63.4 mL/min (A) (by C-G formula based on SCr of 1.75 mg/dL (H)). Liver Function Tests: Recent Labs  Lab 07/21/21 2003  AST 28  ALT 40  ALKPHOS 242*  BILITOT 1.0  PROT 5.6*  ALBUMIN 2.0*    No results for input(s): LIPASE, AMYLASE in the last 168 hours. No results for input(s): AMMONIA in the last 168 hours. Coagulation Profile: Recent Labs  Lab 07/21/21 2003 07/22/21 0350  INR 1.4* 1.3*    Cardiac Enzymes: Recent Labs  Lab 07/24/21 1010  CKTOTAL 265    BNP (last 3 results) No results for input(s): PROBNP in the last 8760 hours. HbA1C: No results for input(s): HGBA1C in the last 72 hours. CBG: Recent Labs  Lab 07/27/21 0654 07/27/21 0749 07/27/21 1214 07/27/21 1637 07/27/21 2054  GLUCAP 214* 276* 212* 279* 291*    Lipid Profile: No results for input(s): CHOL, HDL, LDLCALC, TRIG, CHOLHDL, LDLDIRECT in the last 72 hours. Thyroid Function Tests: No results for input(s): TSH, T4TOTAL, FREET4, T3FREE, THYROIDAB in the last 72 hours. Anemia Panel: No results for input(s): VITAMINB12, FOLATE, FERRITIN, TIBC, IRON, RETICCTPCT in the last 72 hours. Sepsis Labs: Recent Labs  Lab 07/22/21 0300 07/22/21 0350 07/22/21 0645 07/22/21 1250 07/22/21 1522  PROCALCITON  --  21.90  --   --   --   LATICACIDVEN 3.2*  --  3.0* 3.1* 2.5*     Recent Results (from the past 240 hour(s))  Resp Panel by RT-PCR (Flu A&B, Covid) Nasopharyngeal Swab     Status: None   Collection Time: 07/21/21  7:54 PM   Specimen: Nasopharyngeal Swab; Nasopharyngeal(NP) swabs in vial transport medium  Result Value Ref Range Status   SARS Coronavirus 2 by RT PCR NEGATIVE NEGATIVE Final    Comment: (NOTE) SARS-CoV-2 target nucleic acids are NOT DETECTED.  The SARS-CoV-2 RNA is generally detectable in upper  respiratory specimens during the acute phase of infection. The lowest concentration of SARS-CoV-2 viral copies this assay can detect is 138 copies/mL. A negative result does not preclude SARS-Cov-2 infection and should not be used as the sole basis for treatment or other patient management decisions. A negative result may occur with  improper specimen collection/handling, submission of specimen  other than nasopharyngeal swab, presence of viral mutation(s) within the areas targeted by this assay, and inadequate number of viral copies(<138 copies/mL). A negative result must be combined with clinical observations, patient history, and epidemiological information. The expected result is Negative.  Fact Sheet for Patients:  EntrepreneurPulse.com.au  Fact Sheet for Healthcare Providers:  IncredibleEmployment.be  This test is no t yet approved or cleared by the [place] FDA and  has been authorized for detection and/or diagnosis of SARS-CoV-2 by FDA under an Emergency Use Authorization (EUA). This EUA will remain  in effect (meaning this test can be used) for the duration of the COVID-19 declaration under Section 564(b)(1) of the Act, 21 U.S.C.section 360bbb-3(b)(1), unless the authorization is terminated  or revoked sooner.       Influenza A by PCR NEGATIVE NEGATIVE Final   Influenza B by PCR NEGATIVE NEGATIVE Final    Comment: (NOTE) The Xpert Xpress SARS-CoV-2/FLU/RSV plus assay is intended as an aid in the diagnosis of influenza from Nasopharyngeal swab specimens and should not be used as a sole basis for treatment. Nasal washings and aspirates are unacceptable for Xpert Xpress SARS-CoV-2/FLU/RSV testing.  Fact Sheet for Patients: EntrepreneurPulse.com.au  Fact Sheet for Healthcare Providers: IncredibleEmployment.be  This test is not yet approved or cleared by the [place] FDA and has been  authorized for detection and/or diagnosis of SARS-CoV-2 by FDA under an Emergency Use Authorization (EUA). This EUA will remain in effect (meaning this test can be used) for the duration of the COVID-19 declaration under Section 564(b)(1) of the Act, 21 U.S.C. section 360bbb-3(b)(1), unless the authorization is terminated or revoked.  Performed at [facility], [place] [address], [place], [place] [address]   Culture, blood (Routine x 2)     Status: Abnormal   Collection Time: 07/21/21  8:00 PM   Specimen: BLOOD  Result Value Ref Range Status   Specimen Description BLOOD RIGHT ANTECUBITAL  Final   Special Requests   Final    BOTTLES DRAWN AEROBIC AND ANAEROBIC Blood Culture adequate volume   Culture  Setup Time   Final    GRAM POSITIVE COCCI IN CHAINS IN BOTH AEROBIC AND ANAEROBIC BOTTLES    Culture (A)  Final    GROUP B STREP(S.AGALACTIAE)ISOLATED SUSCEPTIBILITIES PERFORMED ON PREVIOUS CULTURE WITHIN THE LAST 5 DAYS. Performed at [facility], [place] [address] [place], [place] [address]    Report Status 07/25/2021 FINAL  Final  Culture, blood (Routine x 2)     Status: Abnormal   Collection Time: 07/21/21  8:03 PM   Specimen: BLOOD  Result Value Ref Range Status   Specimen Description BLOOD LEFT ARM  Final   Special Requests   Final    BOTTLES DRAWN AEROBIC AND ANAEROBIC Blood Culture adequate volume   Culture  Setup Time   Final    GRAM POSITIVE COCCI IN CHAINS IN BOTH AEROBIC AND ANAEROBIC BOTTLES CRITICAL RESULT CALLED TO, READ BACK BY AND VERIFIED WITH: PHARMD B BOLDEN [id number] AT 830 AM BY CM Performed at [facility], [place] [address] [place], [place] [address]    Culture GROUP B STREP(S.AGALACTIAE)ISOLATED (A)  Final   Report Status 07/25/2021 FINAL  Final   Organism ID, Bacteria GROUP B STREP(S.AGALACTIAE)ISOLATED  Final      Susceptibility   Group b strep(s.agalactiae)isolated - MIC*    CLINDAMYCIN <=0.25 SENSITIVE Sensitive     AMPICILLIN <=0.25  SENSITIVE Sensitive     ERYTHROMYCIN <=0.12 SENSITIVE Sensitive  VANCOMYCIN 0.5 SENSITIVE Sensitive     CEFTRIAXONE <=0.12 SENSITIVE Sensitive     LEVOFLOXACIN 0.5 SENSITIVE Sensitive     * GROUP B STREP(S.AGALACTIAE)ISOLATED  Blood Culture ID Panel (Reflexed)     Status: Abnormal   Collection Time: 07/21/21  8:03 PM  Result Value Ref Range Status   Enterococcus faecalis NOT DETECTED NOT DETECTED Final   Enterococcus Faecium NOT DETECTED NOT DETECTED Final   Listeria monocytogenes NOT DETECTED NOT DETECTED Final   Staphylococcus species NOT DETECTED NOT DETECTED Final   Staphylococcus aureus (BCID) NOT DETECTED NOT DETECTED Final   Staphylococcus epidermidis NOT DETECTED NOT DETECTED Final   Staphylococcus lugdunensis NOT DETECTED NOT DETECTED Final   Streptococcus species DETECTED (A) NOT DETECTED Final    Comment: CRITICAL RESULT CALLED TO, READ BACK BY AND VERIFIED WITH: PHARMD B BOLDEN [id number] AT 923 AM BY CM    Streptococcus agalactiae DETECTED (A) NOT DETECTED Final    Comment: CRITICAL RESULT CALLED TO, READ BACK BY AND VERIFIED WITH: PHARMD B BOLDEN [id number] AT 923 AM BY CM    Streptococcus pneumoniae NOT DETECTED NOT DETECTED Final   Streptococcus pyogenes NOT DETECTED NOT DETECTED Final   A.calcoaceticus-baumannii NOT DETECTED NOT DETECTED Final   Bacteroides fragilis NOT DETECTED NOT DETECTED Final   Enterobacterales NOT DETECTED NOT DETECTED Final   Enterobacter cloacae complex NOT DETECTED NOT DETECTED Final   Escherichia coli NOT DETECTED NOT DETECTED Final   Klebsiella aerogenes NOT DETECTED NOT DETECTED Final   Klebsiella oxytoca NOT DETECTED NOT DETECTED Final   Klebsiella pneumoniae NOT DETECTED NOT DETECTED Final   Proteus species NOT DETECTED NOT DETECTED Final   Salmonella species NOT DETECTED NOT DETECTED Final   Serratia marcescens NOT DETECTED NOT DETECTED Final   Haemophilus influenzae NOT DETECTED NOT DETECTED Final   Neisseria meningitidis NOT DETECTED  NOT DETECTED Final   Pseudomonas aeruginosa NOT DETECTED NOT DETECTED Final   Stenotrophomonas maltophilia NOT DETECTED NOT DETECTED Final   Candida albicans NOT DETECTED NOT DETECTED Final   Candida auris NOT DETECTED NOT DETECTED Final   Candida glabrata NOT DETECTED NOT DETECTED Final   Candida krusei NOT DETECTED NOT DETECTED Final   Candida parapsilosis NOT DETECTED NOT DETECTED Final   Candida tropicalis NOT DETECTED NOT DETECTED Final   Cryptococcus neoformans/gattii NOT DETECTED NOT DETECTED Final    Comment: Performed at [facility], [place] [address], [place], [place] [address]  Urine Culture     Status: Abnormal   Collection Time: 07/22/21  3:26 AM   Specimen: In/Out Cath Urine  Result Value Ref Range Status   Specimen Description IN/OUT CATH URINE  Final   Special Requests NONE  Final   Culture (A)  Final    30,000 COLONIES/mL STREPTOCOCCUS AGALACTIAE TESTING AGAINST S. AGALACTIAE NOT ROUTINELY PERFORMED DUE TO PREDICTABILITY OF AMP/PEN/VAN SUSCEPTIBILITY. Performed at [facility], [place] [address], [place], [place] [address]    Report Status 07/23/2021 FINAL  Final  MRSA Next Gen by PCR, Nasal     Status: Abnormal   Collection Time: 07/22/21  5:02 PM   Specimen: Nasal Mucosa; Nasal Swab  Result Value Ref Range Status   MRSA by PCR Next Gen DETECTED (A) NOT DETECTED Final    Comment: RESULT CALLED TO, READ BACK BY AND VERIFIED WITH: ASHLEY ORE RN 07/22/2021 @2051  BY JW (NOTE) The GeneXpert MRSA Assay (FDA approved for NASAL specimens only), is one component of a comprehensive MRSA colonization surveillance program. It is not  intended to diagnose MRSA infection nor to guide or monitor treatment for MRSA infections. Test performance is not FDA approved in patients less than 58 years old. Performed at [facility], [place] [address], [place], [place] [address]   Surgical pcr screen     Status: Abnormal   Collection Time: 07/22/21  8:53 PM   Specimen:  Nasal Mucosa; Nasal Swab  Result Value Ref Range Status   MRSA, PCR POSITIVE (A) NEGATIVE Final    Comment: RESULT CALLED TO, READ BACK BY AND VERIFIED WITH: A ORR,RN@2230  07/22/21 [place]    Staphylococcus aureus POSITIVE (A) NEGATIVE Final    Comment: (NOTE) The Xpert SA Assay (FDA approved for NASAL specimens in patients 73 years of age and older), is one component of a comprehensive surveillance program. It is not intended to diagnose infection nor to guide or monitor treatment. Performed at [facility], [place] [address] [place], [place] [address]   Culture, blood (routine x 2)     Status: None (Preliminary result)   Collection Time: 07/24/21 12:02 AM   Specimen: BLOOD  Result Value Ref Range Status   Specimen Description BLOOD RIGHT ARM  Final   Special Requests   Final    BOTTLES DRAWN AEROBIC AND ANAEROBIC Blood Culture adequate volume   Culture   Final    NO GROWTH 3 DAYS Performed at [facility], [place] [address], [place], [place] [address]    Report Status PENDING  Incomplete  Culture, blood (routine x 2)     Status: None (Preliminary result)   Collection Time: 07/24/21 12:02 AM   Specimen: BLOOD  Result Value Ref Range Status   Specimen Description BLOOD RIGHT ARM  Final   Special Requests   Final    BOTTLES DRAWN AEROBIC AND ANAEROBIC Blood Culture adequate volume   Culture   Final    NO GROWTH 3 DAYS Performed at [facility], [place] [address], [place], [place] [address]    Report Status PENDING  Incomplete          Radiology Studies: No results found.      Scheduled Meds:  amiodarone  200 mg Oral BID   apixaban  5 mg Oral BID   vitamin C  1,000 mg Oral Daily   docusate sodium  100 mg Oral Daily   gabapentin  300 mg Oral TID   influenza vac split quadrivalent PF  0.5 mL Intramuscular Tomorrow-1000   insulin aspart  0-15 Units Subcutaneous TID WC   insulin aspart  0-5 Units Subcutaneous QHS   insulin aspart  3 Units Subcutaneous TID  WC   insulin detemir  15 Units Subcutaneous BID   metoprolol succinate  25 mg Oral Daily   nutrition supplement (JUVEN)  1 packet Oral BID BM   polyethylene glycol  17 g Oral Daily   zinc sulfate  220 mg Oral Daily   Continuous Infusions:  sodium chloride     lactated ringers Stopped (07/23/21 1013)   magnesium sulfate bolus IVPB     penicillin g continuous IV infusion 41.7 mL/hr at 07/28/21 0037          Aline August, MD [facility] 07/28/2021, 7:54 AM

## 2021-07-28 NOTE — Progress Notes (Signed)
Progress Note  Patient Name: [name] Date of Encounter: 07/28/2021  [facility] Cardiologist: Sanda Klein, MD   Subjective   Pateint with muscular pain   No CP   Breathing is fair   Upset that TEE not being done today   Inpatient Medications    Scheduled Meds:  amiodarone  200 mg Oral BID   apixaban  5 mg Oral BID   vitamin C  1,000 mg Oral Daily   docusate sodium  100 mg Oral Daily   gabapentin  300 mg Oral TID   insulin aspart  0-15 Units Subcutaneous TID WC   insulin aspart  0-5 Units Subcutaneous QHS   insulin aspart  3 Units Subcutaneous TID WC   insulin detemir  15 Units Subcutaneous BID   metoprolol succinate  25 mg Oral Daily   nutrition supplement (JUVEN)  1 packet Oral BID BM   polyethylene glycol  17 g Oral Daily   zinc sulfate  220 mg Oral Daily   Continuous Infusions:  sodium chloride     lactated ringers Stopped (07/23/21 1013)   magnesium sulfate bolus IVPB     penicillin g continuous IV infusion     PRN Meds: acetaminophen, alum & mag hydroxide-simeth, bisacodyl, dextrose, diphenhydrAMINE, guaiFENesin-dextromethorphan, hydrALAZINE, HYDROmorphone (DILAUDID) injection, labetalol, magnesium sulfate bolus IVPB, ondansetron, oxyCODONE, phenol, polyethylene glycol, potassium chloride   Vital Signs    Vitals:   07/28/21 0000 07/28/21 0324 07/28/21 0752 07/28/21 0812  BP: 127/75 (!) 152/80 134/86   Pulse: 60 (!) 138 68 (!) 113  Resp: 16 20 20    Temp: 98.7 F (37.1 C) 98.2 F (36.8 C) 99.7 F (37.6 C)   TempSrc: Oral Oral Axillary   SpO2: 99% 96% 95%   Weight:      Height:        Intake/Output Summary (Last 24 hours) at 07/28/2021 0913 Last data filed at 07/28/2021 0751 Gross per 24 hour  Intake 1772.7 ml  Output 3655 ml  Net -1882.3 ml   Last 3 Weights 07/27/2021 07/23/2021 07/22/2021  Weight (lbs) 238 lb 8.6 oz 240 lb 240 lb  Weight (kg) 108.2 kg 108.863 kg 108.863 kg      Telemetry    Rate controlled aflutter  -Reviewed  ECG    N/a - Personally Reviewed  Physical Exam   GEN: No acute distress.   Neck: JVP is not elevated   Cardiac:irregular Respiratory: Clear to auscultation bilaterally.anteirorly GU  Scrotal edema   GI: Soft, nontender, non-distended  MS: No LE edema  s/p R BKA Neuro:  Nonfocal  Psych: Normal affect   Labs    High Sensitivity Troponin:   Recent Labs  Lab 07/22/21 0246 07/22/21 0645  TROPONINIHS 52* 44*     Chemistry Recent Labs  Lab 07/21/21 2003 07/21/21 2249 07/26/21 0055 07/27/21 0126 07/28/21 0055  NA 123*   < > 127* 130* 132*  K 4.7   < > 3.7 3.7 4.3  CL 87*   < > 100 100 101  CO2 18*   < > 19* 22 24  GLUCOSE 516*   < > 157* 255* 252*  BUN 40*   < > 94* 78* 59*  CREATININE 2.76*   < > 2.83* 2.25* 1.75*  CALCIUM 8.6*   < > 7.7* 7.9* 8.0*  MG  --    < > 2.2 2.2 1.9  PROT 5.6*  --   --   --   --   ALBUMIN 2.0*  --   --   --   --  AST 28  --   --   --   --   ALT 40  --   --   --   --   ALKPHOS 242*  --   --   --   --   BILITOT 1.0  --   --   --   --   GFRNONAA 26*   < > 26* 34* 45*  ANIONGAP 18*   < > 8 8 7    < > = values in this interval not displayed.    Lipids No results for input(s): CHOL, TRIG, HDL, LABVLDL, LDLCALC, CHOLHDL in the last 168 hours.  Hematology Recent Labs  Lab 07/26/21 0055 07/27/21 0126 07/28/21 0055  WBC 13.4* 16.0* 17.1*  RBC 4.98 5.39 5.07  HGB 14.2 15.1 14.1  HCT 40.7 45.4 42.2  MCV 81.7 84.2 83.2  MCH 28.5 28.0 27.8  MCHC 34.9 33.3 33.4  RDW 15.6* 15.9* 15.7*  PLT 85* 140* 223   Thyroid No results for input(s): TSH, FREET4 in the last 168 hours.  BNP Recent Labs  Lab 07/27/21 0126  BNP 1,368.1*    DDimer No results for input(s): DDIMER in the last 168 hours.   Radiology    No results found.  Cardiac Studies     Patient Profile     55 y.o. male with a hx of HFpEF (EF = 50%), HTN, DM2, CKD 3, osteomyelitis s/p R BKA who is being seen 07/22/2021 for the evaluation new diagnosis of systolic  dysfunction  Assessment & Plan    Systolic dysfunction (new diagnosis this admission) - 07/2021 echo LVEF 24%, indet diastolic fxn, mod RV dysfunction, PASP is 53 - 04/2021 echo 50-55% - medical therapy limited by renal dysfunction. Start toprol 12.5mg  daiily today. - may be tachy mediated CM givne also diagnosed with new onset aflutter with RVR, or possibly stress induced CM given bateremia and septic shock.  Plan for TEE and possible cardioversion tomorrow    WIll need follow up echo after   If does not improve then possible ischemic eval   2. New onset Aflutter - started on amio this admission, IV has been transitioned to oral - started on eliquis. Age 55, wt 108 kg, Cr 2.8 would increase eliquis to 5mg  bid - TEE/DCCV likely early next week   3. Sepsis/bacteremia strep Agalactiae Bacteremia due to Diabetic Foot Infection  Initially septic shock, now off pressors - ID has requested TEE. Likely TEE/DCCV early this week.   4. AKI on CKD  Cr 1.75 today      For questions or updates, please contact [place] Please consult [email or web address] for contact info under        Signed, Dorris Carnes, MD  07/28/2021, 9:13 AM

## 2021-07-28 NOTE — Progress Notes (Signed)
Occupational Therapy Treatment Patient Details Name: [name] MRN: [id number] DOB: 1965/11/15 Today's Date: 07/28/2021   History of present illness 55 yo admitted 11/14 with infected left foot s/p Left BKA 11/16. Pt with T6-8 discitis. PMhx: Rt BKA, Aflutter, DM, depression and anxiety   OT comments  Pt supine in bed, reports 10/10 pain in L mid/upper back.  Refused mobility, even rolling in bed, due to pain (even when premedicated).  Able to engage in BUE exercises (limited), pour his drink into a cup.  Attempted relaxation and breathing exercises (see PT note), with poor carryover from pt.  Easily agitated.  Will follow acutely, continue to pre-medicate.    Recommendations for follow up therapy are one component of a multi-disciplinary discharge planning process, led by the attending physician.  Recommendations may be updated based on patient status, additional functional criteria and insurance authorization.    Follow Up Recommendations  Acute inpatient rehab (3hours/day)    Assistance Recommended at Discharge Frequent or constant Supervision/Assistance  Equipment Recommendations  Other (comment) (TBD)    Recommendations for Other Services Rehab consult    Precautions / Restrictions Precautions Precautions: Fall;Back Precaution Comments: for comfort Required Braces or Orthoses: Other Brace Other Brace: LLE limb protector       Mobility Bed Mobility               General bed mobility comments: pt refused    Transfers                   General transfer comment: unable     Balance                                           ADL either performed or assessed with clinical judgement   ADL Overall ADL's : Needs assistance/impaired Eating/Feeding: Set up;Bed level   Grooming: Set up;Bed level                                 General ADL Comments: pt declined mobilization due to pain, even premedicated     Extremity/Trunk Assessment              Vision       Perception     Praxis      Cognition Arousal/Alertness: Awake/alert Behavior During Therapy: Agitated Overall Cognitive Status: Impaired/Different from baseline Area of Impairment: Awareness;Problem solving                           Awareness: Emergent Problem Solving: Requires verbal cues General Comments: pt self limiting, decreased awareness          Exercises Exercises: General Upper Extremity General Exercises - Upper Extremity Shoulder Flexion: AROM;Both;10 reps;Supine   Shoulder Instructions       General Comments      Pertinent Vitals/ Pain       Pain Assessment: 0-10 Pain Score: 10-Worst pain ever Pain Location: left mid thoracic Pain Descriptors / Indicators: Constant;Stabbing Pain Intervention(s): Limited activity within patient's tolerance;Monitored during session;Repositioned;Premedicated before session  Home Living                                          Prior  Functioning/Environment              Frequency  Min 2X/week        Progress Toward Goals  OT Goals(current goals can now be found in the care plan section)  Progress towards OT goals: Progressing toward goals  Acute Rehab OT Goals Patient Stated Goal: less pain OT Goal Formulation: With patient Time For Goal Achievement: 08/07/21 Potential to Achieve Goals: Good  Plan Discharge plan remains appropriate;Frequency remains appropriate    Co-evaluation    PT/OT/SLP Co-Evaluation/Treatment: Yes     OT goals addressed during session: ADL's and self-care      AM-PAC OT "6 Clicks" Daily Activity     Outcome Measure   Help from another person eating meals?: A Little Help from another person taking care of personal grooming?: A Little Help from another person toileting, which includes using toliet, bedpan, or urinal?: Total Help from another person bathing (including washing,  rinsing, drying)?: A Lot Help from another person to put on and taking off regular upper body clothing?: A Lot Help from another person to put on and taking off regular lower body clothing?: Total 6 Click Score: 12    End of Session Equipment Utilized During Treatment: Oxygen  OT Visit Diagnosis: Muscle weakness (generalized) (M62.81);Pain;Other abnormalities of gait and mobility (R26.89) Pain - part of body:  (back)   Activity Tolerance Patient limited by pain;Treatment limited secondary to agitation   Patient Left in bed;with call bell/phone within reach;with nursing/sitter in room;with bed alarm set   Nurse Communication Mobility status;Other (comment) (pain)        Time: 8469-6295 OT Time Calculation (min): 33 min  Charges: OT General Charges $OT Visit: 1 Visit OT Treatments $Self Care/Home Management : 8-22 mins  Jolaine Artist, OT [place] Pager [phone] Office [phone]   Delight Stare 07/28/2021, 12:51 PM

## 2021-07-28 NOTE — Progress Notes (Signed)
   07/28/21 1633  Assess: MEWS Score  Temp 98.2 F (36.8 C)  BP 136/84  Pulse Rate 94  ECG Heart Rate (!) 108  Resp (!) 25  Level of Consciousness Alert  SpO2 94 %  O2 Device Nasal Cannula  Patient Activity (if Appropriate) In bed  O2 Flow Rate (L/min) 3 L/min  Assess: MEWS Score  MEWS Temp 0  MEWS Systolic 0  MEWS Pulse 1  MEWS RR 1  MEWS LOC 0  MEWS Score 2  MEWS Score Color Yellow  Assess: if the MEWS score is Yellow or Red  Were vital signs taken at a resting state? Yes  Focused Assessment Change from prior assessment (see assessment flowsheet)  Early Detection of Sepsis Score *See Row Information* Low  MEWS guidelines implemented *See Row Information* Yes  Notify: Charge Nurse/RN  Name of Charge Nurse/RN Notified [place]  Date Charge Nurse/RN Notified 07/28/21  Time Charge Nurse/RN Notified 54  Document  Patient Outcome Other (Comment) (Monitor)  Progress note created (see row info) Yes

## 2021-07-28 NOTE — Anesthesia Preprocedure Evaluation (Addendum)
Anesthesia Evaluation  Patient identified by MRN, date of birth, ID band Patient awake    Reviewed: Allergy & Precautions, H&P , NPO status , Patient's Chart, lab work & pertinent test results, reviewed documented beta blocker date and time   Airway Mallampati: II  TM Distance: >3 FB Neck ROM: Full    Dental no notable dental hx. (+) Teeth Intact, Dental Advisory Given   Pulmonary shortness of breath,    Pulmonary exam normal breath sounds clear to auscultation       Cardiovascular Exercise Tolerance: Good hypertension, Pt. on medications and Pt. on home beta blockers +CHF   Rhythm:Irregular Rate:Tachycardia     Neuro/Psych Anxiety Depression negative neurological ROS     GI/Hepatic negative GI ROS, Neg liver ROS,   Endo/Other  diabetes, Insulin Dependent  Renal/GU negative Renal ROS  negative genitourinary   Musculoskeletal   Abdominal   Peds  Hematology negative hematology ROS (+)   Anesthesia Other Findings   Reproductive/Obstetrics negative OB ROS                            Anesthesia Physical Anesthesia Plan  ASA: 3  Anesthesia Plan: General   Post-op Pain Management: Minimal or no pain anticipated   Induction: Intravenous  PONV Risk Score and Plan: 2 and Propofol infusion  Airway Management Planned: Nasal Cannula  Additional Equipment:   Intra-op Plan:   Post-operative Plan:   Informed Consent: I have reviewed the patients History and Physical, chart, labs and discussed the procedure including the risks, benefits and alternatives for the proposed anesthesia with the patient or authorized representative who has indicated his/her understanding and acceptance.     Dental advisory given  Plan Discussed with: CRNA  Anesthesia Plan Comments:        Anesthesia Quick Evaluation

## 2021-07-29 ENCOUNTER — Encounter (HOSPITAL_COMMUNITY)
Admission: EM | Disposition: A | Discharge status: Skilled Nursing Facility | Payer: Self-pay | Source: Home / Self Care | Attending: Internal Medicine

## 2021-07-29 ENCOUNTER — Inpatient Hospital Stay (HOSPITAL_COMMUNITY): Payer: Medicare HMO | Admitting: Anesthesiology

## 2021-07-29 ENCOUNTER — Inpatient Hospital Stay (HOSPITAL_COMMUNITY): Payer: Medicare HMO

## 2021-07-29 DIAGNOSIS — I4892 Unspecified atrial flutter: Secondary | ICD-10-CM | POA: Diagnosis not present

## 2021-07-29 DIAGNOSIS — I519 Heart disease, unspecified: Secondary | ICD-10-CM | POA: Diagnosis not present

## 2021-07-29 DIAGNOSIS — I083 Combined rheumatic disorders of mitral, aortic and tricuspid valves: Secondary | ICD-10-CM | POA: Diagnosis not present

## 2021-07-29 DIAGNOSIS — R6520 Severe sepsis without septic shock: Secondary | ICD-10-CM | POA: Diagnosis not present

## 2021-07-29 DIAGNOSIS — E11628 Type 2 diabetes mellitus with other skin complications: Secondary | ICD-10-CM | POA: Diagnosis not present

## 2021-07-29 DIAGNOSIS — A419 Sepsis, unspecified organism: Secondary | ICD-10-CM | POA: Diagnosis not present

## 2021-07-29 DIAGNOSIS — A480 Gas gangrene: Secondary | ICD-10-CM | POA: Diagnosis not present

## 2021-07-29 DIAGNOSIS — R6521 Severe sepsis with septic shock: Secondary | ICD-10-CM | POA: Diagnosis not present

## 2021-07-29 DIAGNOSIS — E1165 Type 2 diabetes mellitus with hyperglycemia: Secondary | ICD-10-CM | POA: Diagnosis not present

## 2021-07-29 DIAGNOSIS — R7881 Bacteremia: Secondary | ICD-10-CM

## 2021-07-29 DIAGNOSIS — Z794 Long term (current) use of insulin: Secondary | ICD-10-CM | POA: Diagnosis not present

## 2021-07-29 DIAGNOSIS — N179 Acute kidney failure, unspecified: Secondary | ICD-10-CM | POA: Diagnosis not present

## 2021-07-29 DIAGNOSIS — L089 Local infection of the skin and subcutaneous tissue, unspecified: Secondary | ICD-10-CM | POA: Diagnosis not present

## 2021-07-29 HISTORY — PX: TEE WITHOUT CARDIOVERSION: SHX5443

## 2021-07-29 HISTORY — PX: BUBBLE STUDY: SHX6837

## 2021-07-29 HISTORY — PX: CARDIOVERSION: SHX1299

## 2021-07-29 LAB — GLUCOSE, CAPILLARY
Glucose-Capillary: 217 mg/dL — ABNORMAL HIGH (ref 70–99)
Glucose-Capillary: 161 mg/dL — ABNORMAL HIGH (ref 70–99)
Glucose-Capillary: 239 mg/dL — ABNORMAL HIGH (ref 70–99)
Glucose-Capillary: 315 mg/dL — ABNORMAL HIGH (ref 70–99)

## 2021-07-29 LAB — BASIC METABOLIC PANEL
Anion gap: 8 (ref 5–15)
BUN: 44 mg/dL — ABNORMAL HIGH (ref 6–20)
CO2: 24 mmol/L (ref 22–32)
Calcium: 8.2 mg/dL — ABNORMAL LOW (ref 8.9–10.3)
Chloride: 100 mmol/L (ref 98–111)
Creatinine, Ser: 1.58 mg/dL — ABNORMAL HIGH (ref 0.61–1.24)
GFR, Estimated: 51 mL/min — ABNORMAL LOW (ref >60)
Glucose, Bld: 268 mg/dL — ABNORMAL HIGH (ref 70–99)
Potassium: 4.6 mmol/L (ref 3.5–5.1)
Sodium: 132 mmol/L — ABNORMAL LOW (ref 135–145)

## 2021-07-29 LAB — CBC
HCT: 43.1 % (ref 39.0–52.0)
Hemoglobin: 14 g/dL (ref 13.0–17.0)
MCH: 27.9 pg (ref 26.0–34.0)
MCHC: 32.5 g/dL (ref 30.0–36.0)
MCV: 85.9 fL (ref 80.0–100.0)
Platelets: 280 10*3/uL (ref 150–400)
RBC: 5.02 MIL/uL (ref 4.22–5.81)
RDW: 15.8 % — ABNORMAL HIGH (ref 11.5–15.5)
WBC: 15.4 10*3/uL — ABNORMAL HIGH (ref 4.0–10.5)
nRBC: 0.1 % (ref 0.0–0.2)

## 2021-07-29 LAB — CULTURE, BLOOD (ROUTINE X 2)
Culture: NO GROWTH
Culture: NO GROWTH
Special Requests: ADEQUATE
Special Requests: ADEQUATE

## 2021-07-29 LAB — MAGNESIUM: Magnesium: 1.6 mg/dL — ABNORMAL LOW (ref 1.7–2.4)

## 2021-07-29 SURGERY — ECHOCARDIOGRAM, TRANSESOPHAGEAL
Anesthesia: General

## 2021-07-29 SURGERY — ECHOCARDIOGRAM, TRANSESOPHAGEAL
Anesthesia: Monitor Anesthesia Care

## 2021-07-29 MED ORDER — SODIUM CHLORIDE 0.9 % IV SOLN: INTRAVENOUS | Status: DC

## 2021-07-29 MED ORDER — PROPOFOL 500 MG/50ML IV EMUL
INTRAVENOUS | Status: DC | PRN
  Administered 2021-07-29: 100 ug/kg/min via INTRAVENOUS

## 2021-07-29 MED ORDER — INSULIN DETEMIR 100 UNIT/ML ~~LOC~~ SOLN
18.0000 [IU] | Freq: Two times a day (BID) | SUBCUTANEOUS | Status: DC
  Administered 2021-07-29 – 2021-07-30 (×3): 18 [IU] via SUBCUTANEOUS
  Filled 2021-07-29 (×4): qty 0.18

## 2021-07-29 MED ORDER — PHENYLEPHRINE 40 MCG/ML (10ML) SYRINGE FOR IV PUSH (FOR BLOOD PRESSURE SUPPORT)
PREFILLED_SYRINGE | INTRAVENOUS | Status: DC | PRN
  Administered 2021-07-29: 120 ug via INTRAVENOUS
  Administered 2021-07-29: 80 ug via INTRAVENOUS
  Administered 2021-07-29: 120 ug via INTRAVENOUS

## 2021-07-29 MED ORDER — FUROSEMIDE 10 MG/ML IJ SOLN
60.0000 mg | Freq: Once | INTRAMUSCULAR | Status: AC
  Administered 2021-07-29: 60 mg via INTRAVENOUS
  Filled 2021-07-29: qty 6

## 2021-07-29 MED ORDER — MAGNESIUM SULFATE 2 GM/50ML IV SOLN
2.0000 g | Freq: Once | INTRAVENOUS | Status: AC
  Administered 2021-07-29: 2 g via INTRAVENOUS
  Filled 2021-07-29: qty 50

## 2021-07-29 MED ORDER — AMOXICILLIN 500 MG PO CAPS
1000.0000 mg | ORAL_CAPSULE | Freq: Three times a day (TID) | ORAL | Status: DC
  Administered 2021-08-06: 1000 mg via ORAL
  Filled 2021-07-29 (×2): qty 2

## 2021-07-29 MED ORDER — PROPOFOL 10 MG/ML IV BOLUS
INTRAVENOUS | Status: DC | PRN
  Administered 2021-07-29: 20 mg via INTRAVENOUS

## 2021-07-29 MED ORDER — LIDOCAINE 2% (20 MG/ML) 5 ML SYRINGE
INTRAMUSCULAR | Status: DC | PRN
  Administered 2021-07-29: 80 mg via INTRAVENOUS

## 2021-07-29 MED ORDER — FENTANYL CITRATE (PF) 100 MCG/2ML IJ SOLN
INTRAMUSCULAR | Status: DC | PRN
  Administered 2021-07-29: 50 ug via INTRAVENOUS
  Administered 2021-07-29: 25 ug via INTRAVENOUS

## 2021-07-29 NOTE — Progress Notes (Signed)
Inpatient Diabetes Program Recommendations  AACE/ADA: New Consensus Statement on Inpatient Glycemic Control (2015)  Target Ranges:  Prepandial:   less than 140 mg/dL      Peak postprandial:   less than 180 mg/dL (1-2 hours)      Critically ill patients:  140 - 180 mg/dL   Lab Results  Component Value Date   GLUCAP 217 (H) 07/29/2021   HGBA1C 13.8 (H) 07/22/2021    Latest Reference Range & Units 07/28/21 07:50 07/28/21 11:43 07/28/21 16:29 07/28/21 21:36 07/29/21 07:35  Glucose-Capillary 70 - 99 mg/dL 174 (H) 157 (H) 227 (H) 267 (H) 217 (H)   Review of Glycemic Control  Current orders for Inpatient glycemic control:  Levemir 15 units bid Novolog 0-15 units tid + hs Novolog 3 units tid  Inpatient Diabetes Program Recommendations:    - Consider increasing Levemir to 18 units bid  Thanks,  Tama Headings RN, MSN, BC-ADM Inpatient Diabetes Coordinator Team Pager [phone] (8a-5p)

## 2021-07-29 NOTE — Anesthesia Procedure Notes (Signed)
Procedure Name: MAC Date/Time: 07/29/2021 8:29 AM Performed by: Imagene Riches, CRNA Pre-anesthesia Checklist: Patient identified, Emergency Drugs available, Suction available, Timeout performed and Patient being monitored Patient Re-evaluated:Patient Re-evaluated prior to induction Oxygen Delivery Method: Nasal cannula

## 2021-07-29 NOTE — Progress Notes (Addendum)
Progress Note  Patient Name: [name] Date of Encounter: 07/29/2021  [facility] Cardiologist: Sanda Klein, MD   Subjective    Pt says his breathing is a little better  Denis CP   Inpatient Medications    Scheduled Meds:  amiodarone  200 mg Oral BID   apixaban  5 mg Oral BID   vitamin C  1,000 mg Oral Daily   docusate sodium  100 mg Oral Daily   gabapentin  300 mg Oral TID   insulin aspart  0-15 Units Subcutaneous TID WC   insulin aspart  0-5 Units Subcutaneous QHS   insulin aspart  3 Units Subcutaneous TID WC   insulin detemir  15 Units Subcutaneous BID   metoprolol succinate  25 mg Oral Daily   nutrition supplement (JUVEN)  1 packet Oral BID BM   polyethylene glycol  17 g Oral Daily   zinc sulfate  220 mg Oral Daily   Continuous Infusions:  sodium chloride     sodium chloride     sodium chloride 20 mL/hr at 07/28/21 1622   lactated ringers Stopped (07/23/21 1013)   penicillin g continuous IV infusion 12 Million Units (07/28/21 2327)   PRN Meds: acetaminophen, alum & mag hydroxide-simeth, bisacodyl, dextrose, diphenhydrAMINE, guaiFENesin-dextromethorphan, hydrALAZINE, HYDROmorphone (DILAUDID) injection, labetalol, ondansetron, oxyCODONE, phenol, polyethylene glycol, potassium chloride   Vital Signs    Vitals:   07/29/21 0413 07/29/21 0455 07/29/21 0552 07/29/21 0615  BP: (!) 139/97 (!) 142/90 (!) 160/97 (!) 149/81  Pulse:      Resp: 20  (!) 22 (!) 23  Temp:      TempSrc:      SpO2:      Weight:      Height:        Intake/Output Summary (Last 24 hours) at 07/29/2021 0644 Last data filed at 07/29/2021 2202 Gross per 24 hour  Intake 2068.43 ml  Output 3925 ml  Net -1856.57 ml   Last 3 Weights 07/27/2021 07/23/2021 07/22/2021  Weight (lbs) 238 lb 8.6 oz 240 lb 240 lb  Weight (kg) 108.2 kg 108.863 kg 108.863 kg      Telemetry    Now SR 80-Reviewed   Physical Exam   GEN: No acute distress.   Neck: JVP increased  Cardiac:  RRR  Gr  I/VI shsytolic murmur   Respiratory: Clear to auscultation bilaterally.anteirorly GU  Scrotal edema   GI: Soft, nontender, non-distended  MS: UE edema  s/p R BKA Neuro:  Nonfocal  Psych: Normal affect   Labs    High Sensitivity Troponin:   Recent Labs  Lab 07/22/21 0246 07/22/21 0645  TROPONINIHS 52* 44*     Chemistry Recent Labs  Lab 07/27/21 0126 07/28/21 0055 07/29/21 0210  NA 130* 132* 132*  K 3.7 4.3 4.6  CL 100 101 100  CO2 22 24 24   GLUCOSE 255* 252* 268*  BUN 78* 59* 44*  CREATININE 2.25* 1.75* 1.58*  CALCIUM 7.9* 8.0* 8.2*  MG 2.2 1.9 1.6*  GFRNONAA 34* 45* 51*  ANIONGAP 8 7 8     Lipids No results for input(s): CHOL, TRIG, HDL, LABVLDL, LDLCALC, CHOLHDL in the last 168 hours.  Hematology Recent Labs  Lab 07/27/21 0126 07/28/21 0055 07/29/21 0210  WBC 16.0* 17.1* 15.4*  RBC 5.39 5.07 5.02  HGB 15.1 14.1 14.0  HCT 45.4 42.2 43.1  MCV 84.2 83.2 85.9  MCH 28.0 27.8 27.9  MCHC 33.3 33.4 32.5  RDW 15.9* 15.7* 15.8*  PLT 140* 223  280   Thyroid No results for input(s): TSH, FREET4 in the last 168 hours.  BNP Recent Labs  Lab 07/27/21 0126  BNP 1,368.1*    DDimer No results for input(s): DDIMER in the last 168 hours.   Radiology    No results found.  Cardiac Studies     Patient Profile     55 y.o. male with a hx of HFpEF (EF = 50%), HTN, DM2, CKD 3, osteomyelitis s/p R BKA who is being seen 07/22/2021 for the evaluation new diagnosis of systolic dysfunction  Assessment & Plan    Systolic dysfunction (new diagnosis this admission) - 07/2021 echo LVEF 66%, indet diastolic fxn, mod RV dysfunction, PASP is 53 - 04/2021 echo 50-55% - may be tachy mediated CM givne also diagnosed with new onset aflutter with RVR, or possibly stress induced CM given bateremia and septic shock.  Had cardioverson today  Rates 80  SR I would try 1 dose of IV lasix and follow response   STrict I/O  2. New onset Aflutter - started on amio this admission, IV has  been transitioned to oral - started on eliquis. Age 55, wt 108 kg, Cr 2.8 would increase eliquis to 5mg  bid -Cardioverted today   Keepo on tele   3. Sepsis/bacteremia strep Agalactiae Bacteremia due to Diabetic Foot Infection  Initially septic shock, now off pressors TEE today  WIll review images     4. AKI on CKD  Cr  1.58 IMprved   WIll give 1 dose lasix and follow     For questions or updates, please contact [place] Please consult [email or web address] for contact info under        Signed, Dorris Carnes, MD  07/29/2021, 6:44 AM

## 2021-07-29 NOTE — Progress Notes (Signed)
[name], male   DOB: [birth year], 55 y.o.   MRN: [id number]  PROGRESS NOTE    [name]  QQI:[id number] DOB: 06-27-1966 DOA: 07/21/2021 PCP: Patient, No Pcp Per (Inactive)   Brief Narrative:  55 year old male with history of diabetes mellitus type 2, diabetic foot infection with prior right BKA, chronic diastolic CHF, hypertension, dyslipidemia presented with worsening left foot swelling/discoloration and discharge.  On presentation, he was hypotensive, tachycardic with A. fib with RVR with CT suggestive of first MTP osteomyelitis and soft tissue gas concerning for possible necrotizing fasciitis along with AKI with creatinine of 2.7 and mild DKA.  His blood pressures did not improve with IV fluids and he was transferred to ICU and PCCM was consulted.  Orthopedics was consulted.  He underwent left transtibial amputation on 07/23/2021.  He was found to have strep agalactiae bacteremia.  ID was consulted.  He was found to have vertebral discitis versus osteomyelitis on MRI.  He was also found to have new systolic dysfunction with LVEF of 35% along with new onset atrial flutter requiring IV amiodarone which has been switched to oral.  Cardiology was consulted.  He has been started on Eliquis.  Cardiology is planning for TEE/DCCV today.  Assessment & Plan:   Septic shock: Evolving on admission from bacteremia/diabetic foot infection -Off pressors.  Currently hemodynamically stable. -Off IV fluids.  Antibiotics as below. -Required care in ICU under PCCM service: Care has been transferred to [facility] from 07/27/2021 onwards  Diabetic foot infection  -Status post left transtibial amputation on 07/23/2021.  Wound/wound VAC care as per Dr. Sharol Given.  Group B streptococcus bacteremia Possible thoracic discitis/osteomyelitis -TTE showed no vegetation.  Cardiology is planning for TEE possibly today. -MRI of cervical/thoracic/lumbar spine showed possible thoracic osteomyelitis/discitis without  evidence of abscess but MRI was without contrast.  Neurosurgery evaluation appreciated: Not convinced that patient has epidural abscess.  Recommended to manage conservatively with IV antibiotics. -Antibiotics have been changed to penicillin.  Patient will probably need 8 weeks of antibiotics per ID (2 weeks of parenteral therapy with penicillin from 07/23/2021-08/05/2021; then transition to amoxicillin 1 g 3 times daily till 09/14/2021). -Repeat blood cultures from 07/24/2021 have been negative so far  AKI on CKD stage IV Acute metabolic acidosis -Renal function improving.  Creatinine improving to 1.58 today.  Off IV fluids.  Monitor -Serum bicarbonate has improved  Hyponatremia -Improving.  Monitor  New systolic dysfunction in a patient with chronic diastolic heart failure -Echo showed EF of 35% (previous echo showed EF of 50 to 55% in 04/2021) -Strict input output.  Daily weights.  Fluid restriction.  Continue conservative management.  Continue metoprolol succinate. -Cardiology following.  New onset atrial flutter -Currently tachycardic.  IV amiodarone has been switched to oral amiodarone.  Continue Eliquis and metoprolol succinate.  Cardiology planning for TEE DCCV today.  Hypomagnesemia -Replace.  Repeat a.m. labs  Leukocytosis -Still significant but improving.  Monitor  Diabetes mellitus type 2 with hyperglycemia -Continue Lantus along with CBGs with SSI  Thrombocytopenia -Resolved  Generalized deconditioning -PT recommends CIR.  A consult to CIR.  DVT prophylaxis: Apixaban Code Status: Full Family Communication: None at bedside Disposition Plan: Status is: Inpatient  Remains inpatient appropriate because: Of need for IV antibiotics.  TEE/DCCV planned for today.  Consultants: Cardiology/orthopedic/ID/neurosurgery/PCCM/neurosurgery  Procedures: Left transtibial amputation on 07/23/2021.  Echo  Antimicrobials:  Anti-infectives (From admission, onward)    Start      Dose/Rate Route Frequency Ordered Stop   07/28/21 1000  [  MAR Hold]  penicillin G potassium 12 Million Units in dextrose 5 % 500 mL continuous infusion        (MAR Hold since Tue 07/29/2021 at 0751.Hold Reason: Transfer to a Procedural area)   12 Million Units 41.7 mL/hr over 12 Hours Intravenous Every 12 hours 07/28/21 0846     07/25/21 1000  penicillin G potassium 8 Million Units in dextrose 5 % 500 mL continuous infusion  Status:  Discontinued        8 Million Units 41.7 mL/hr over 12 Hours Intravenous Every 12 hours 07/25/21 0854 07/28/21 0846   07/23/21 1345  cefTRIAXone (ROCEPHIN) 2 g in sodium chloride 0.9 % 100 mL IVPB  Status:  Discontinued        2 g 200 mL/hr over 30 Minutes Intravenous Every 24 hours 07/23/21 1246 07/25/21 0854   07/23/21 0815  ceFAZolin (ANCEF) IVPB 2g/100 mL premix  Status:  Discontinued        2 g 200 mL/hr over 30 Minutes Intravenous To ShortStay Surgical 07/23/21 0622 07/23/21 0836   07/22/21 2000  linezolid (ZYVOX) IVPB 600 mg  Status:  Discontinued        600 mg 300 mL/hr over 60 Minutes Intravenous Every 12 hours 07/22/21 1544 07/23/21 1349   07/22/21 0800  piperacillin-tazobactam (ZOSYN) IVPB 3.375 g  Status:  Discontinued        3.375 g 12.5 mL/hr over 240 Minutes Intravenous Every 8 hours 07/22/21 0107 07/23/21 1246   07/22/21 0130  clindamycin (CLEOCIN) IVPB 600 mg  Status:  Discontinued        600 mg 100 mL/hr over 30 Minutes Intravenous Every 8 hours 07/22/21 0101 07/22/21 1544   07/22/21 0115  piperacillin-tazobactam (ZOSYN) IVPB 3.375 g        3.375 g 100 mL/hr over 30 Minutes Intravenous STAT 07/22/21 0107 07/22/21 0223   07/21/21 2335  vancomycin variable dose per unstable renal function (pharmacist dosing)  Status:  Discontinued         Does not apply See admin instructions 07/21/21 2335 07/22/21 1544   07/21/21 2000  vancomycin (VANCOREADY) IVPB 2000 mg/400 mL        2,000 mg 200 mL/hr over 120 Minutes Intravenous  Once 07/21/21 1955  07/22/21 0004   07/21/21 2000  cefTRIAXone (ROCEPHIN) 2 g in sodium chloride 0.9 % 100 mL IVPB        2 g 200 mL/hr over 30 Minutes Intravenous  Once 07/21/21 1955 07/21/21 2130        Subjective: Patient seen and examined at bedside.  No worsening shortness of breath, fever, vomiting reported.  Still complains of intermittent lower back pain. Objective: Vitals:   07/29/21 0455 07/29/21 0552 07/29/21 0615 07/29/21 0754  BP: (!) 142/90 (!) 160/97 (!) 149/81 (!) 132/108  Pulse:    (!) 118  Resp:  (!) 22 (!) 23 20  Temp:      TempSrc:      SpO2:    98%  Weight:    108.2 kg  Height:    6\' 3"  (1.905 m)    Intake/Output Summary (Last 24 hours) at 07/29/2021 0810 Last data filed at 07/29/2021 6269 Gross per 24 hour  Intake 1776.53 ml  Output 3425 ml  Net -1648.47 ml    Filed Weights   07/23/21 0827 07/27/21 0252 07/29/21 0754  Weight: 108.9 kg 108.2 kg 108.2 kg    Examination:  General exam: Still on 3 L oxygen via nasal cannula.  No acute  distress.  Looks chronically ill and deconditioned.   Respiratory system: Bilateral decreased breath sounds at bases with scattered crackles and intermittent tachypnea  cardiovascular system: S1-S2 heard; still tachycardic gastrointestinal system: Abdomen is mildly distended, soft and nontender.  Normal bowel sounds heard  extremities: Left BKA with amputation brace present; right lower extremity BKA present Central nervous system: Alert and oriented.  No focal neurological deficits noted  skin: No obvious ecchymosis/lesions  psychiatry: Flat affect.  Data Reviewed: I have personally reviewed following labs and imaging studies  CBC: Recent Labs  Lab 07/25/21 0154 07/26/21 0055 07/27/21 0126 07/28/21 0055 07/29/21 0210  WBC 15.0* 13.4* 16.0* 17.1* 15.4*  HGB 14.1 14.2 15.1 14.1 14.0  HCT 41.1 40.7 45.4 42.2 43.1  MCV 83.0 81.7 84.2 83.2 85.9  PLT 102* 85* 140* 223 326    Basic Metabolic Panel: Recent Labs  Lab  07/25/21 0154 07/26/21 0055 07/27/21 0126 07/28/21 0055 07/29/21 0210  NA 127* 127* 130* 132* 132*  K 3.8 3.7 3.7 4.3 4.6  CL 98 100 100 101 100  CO2 19* 19* 22 24 24   GLUCOSE 181* 157* 255* 252* 268*  BUN 94* 94* 78* 59* 44*  CREATININE 3.23* 2.83* 2.25* 1.75* 1.58*  CALCIUM 7.9* 7.7* 7.9* 8.0* 8.2*  MG 2.3 2.2 2.2 1.9 1.6*    GFR: Estimated Creatinine Clearance: 70.2 mL/min (A) (by C-G formula based on SCr of 1.58 mg/dL (H)). Liver Function Tests: No results for input(s): AST, ALT, ALKPHOS, BILITOT, PROT, ALBUMIN in the last 168 hours.  No results for input(s): LIPASE, AMYLASE in the last 168 hours. No results for input(s): AMMONIA in the last 168 hours. Coagulation Profile: Recent Labs  Lab 07/28/21 1557  INR 1.6*    Cardiac Enzymes: Recent Labs  Lab 07/24/21 1010  CKTOTAL 265    BNP (last 3 results) No results for input(s): PROBNP in the last 8760 hours. HbA1C: No results for input(s): HGBA1C in the last 72 hours. CBG: Recent Labs  Lab 07/28/21 0750 07/28/21 1143 07/28/21 1629 07/28/21 2136 07/29/21 0735  GLUCAP 174* 157* 227* 267* 217*    Lipid Profile: No results for input(s): CHOL, HDL, LDLCALC, TRIG, CHOLHDL, LDLDIRECT in the last 72 hours. Thyroid Function Tests: No results for input(s): TSH, T4TOTAL, FREET4, T3FREE, THYROIDAB in the last 72 hours. Anemia Panel: No results for input(s): VITAMINB12, FOLATE, FERRITIN, TIBC, IRON, RETICCTPCT in the last 72 hours. Sepsis Labs: Recent Labs  Lab 07/22/21 1250 07/22/21 1522  LATICACIDVEN 3.1* 2.5*     Recent Results (from the past 240 hour(s))  Resp Panel by RT-PCR (Flu A&B, Covid) Nasopharyngeal Swab     Status: None   Collection Time: 07/21/21  7:54 PM   Specimen: Nasopharyngeal Swab; Nasopharyngeal(NP) swabs in vial transport medium  Result Value Ref Range Status   SARS Coronavirus 2 by RT PCR NEGATIVE NEGATIVE Final    Comment: (NOTE) SARS-CoV-2 target nucleic acids are NOT  DETECTED.  The SARS-CoV-2 RNA is generally detectable in upper respiratory specimens during the acute phase of infection. The lowest concentration of SARS-CoV-2 viral copies this assay can detect is 138 copies/mL. A negative result does not preclude SARS-Cov-2 infection and should not be used as the sole basis for treatment or other patient management decisions. A negative result may occur with  improper specimen collection/handling, submission of specimen other than nasopharyngeal swab, presence of viral mutation(s) within the areas targeted by this assay, and inadequate number of viral copies(<138 copies/mL). A negative result must be combined  with clinical observations, patient history, and epidemiological information. The expected result is Negative.  Fact Sheet for Patients:  EntrepreneurPulse.com.au  Fact Sheet for Healthcare Providers:  IncredibleEmployment.be  This test is no t yet approved or cleared by the [place] FDA and  has been authorized for detection and/or diagnosis of SARS-CoV-2 by FDA under an Emergency Use Authorization (EUA). This EUA will remain  in effect (meaning this test can be used) for the duration of the COVID-19 declaration under Section 564(b)(1) of the Act, 21 U.S.C.section 360bbb-3(b)(1), unless the authorization is terminated  or revoked sooner.       Influenza A by PCR NEGATIVE NEGATIVE Final   Influenza B by PCR NEGATIVE NEGATIVE Final    Comment: (NOTE) The Xpert Xpress SARS-CoV-2/FLU/RSV plus assay is intended as an aid in the diagnosis of influenza from Nasopharyngeal swab specimens and should not be used as a sole basis for treatment. Nasal washings and aspirates are unacceptable for Xpert Xpress SARS-CoV-2/FLU/RSV testing.  Fact Sheet for Patients: EntrepreneurPulse.com.au  Fact Sheet for Healthcare Providers: IncredibleEmployment.be  This test is not yet  approved or cleared by the [place] FDA and has been authorized for detection and/or diagnosis of SARS-CoV-2 by FDA under an Emergency Use Authorization (EUA). This EUA will remain in effect (meaning this test can be used) for the duration of the COVID-19 declaration under Section 564(b)(1) of the Act, 21 U.S.C. section 360bbb-3(b)(1), unless the authorization is terminated or revoked.  Performed at [facility], [place] [address], [place], [place] [address]   Culture, blood (Routine x 2)     Status: Abnormal   Collection Time: 07/21/21  8:00 PM   Specimen: BLOOD  Result Value Ref Range Status   Specimen Description BLOOD RIGHT ANTECUBITAL  Final   Special Requests   Final    BOTTLES DRAWN AEROBIC AND ANAEROBIC Blood Culture adequate volume   Culture  Setup Time   Final    GRAM POSITIVE COCCI IN CHAINS IN BOTH AEROBIC AND ANAEROBIC BOTTLES    Culture (A)  Final    GROUP B STREP(S.AGALACTIAE)ISOLATED SUSCEPTIBILITIES PERFORMED ON PREVIOUS CULTURE WITHIN THE LAST 5 DAYS. Performed at [facility], [place] [address] [place], [place] [address]    Report Status 07/25/2021 FINAL  Final  Culture, blood (Routine x 2)     Status: Abnormal   Collection Time: 07/21/21  8:03 PM   Specimen: BLOOD  Result Value Ref Range Status   Specimen Description BLOOD LEFT ARM  Final   Special Requests   Final    BOTTLES DRAWN AEROBIC AND ANAEROBIC Blood Culture adequate volume   Culture  Setup Time   Final    GRAM POSITIVE COCCI IN CHAINS IN BOTH AEROBIC AND ANAEROBIC BOTTLES CRITICAL RESULT CALLED TO, READ BACK BY AND VERIFIED WITH: PHARMD B BOLDEN [id number] AT 830 AM BY CM Performed at [facility], [place] [address] [place], [place] [address]    Culture GROUP B STREP(S.AGALACTIAE)ISOLATED (A)  Final   Report Status 07/25/2021 FINAL  Final   Organism ID, Bacteria GROUP B STREP(S.AGALACTIAE)ISOLATED  Final      Susceptibility   Group b strep(s.agalactiae)isolated - MIC*     CLINDAMYCIN <=0.25 SENSITIVE Sensitive     AMPICILLIN <=0.25 SENSITIVE Sensitive     ERYTHROMYCIN <=0.12 SENSITIVE Sensitive     VANCOMYCIN 0.5 SENSITIVE Sensitive     CEFTRIAXONE <=0.12 SENSITIVE Sensitive     LEVOFLOXACIN 0.5 SENSITIVE Sensitive     * GROUP  B STREP(S.AGALACTIAE)ISOLATED  Blood Culture ID Panel (Reflexed)     Status: Abnormal   Collection Time: 07/21/21  8:03 PM  Result Value Ref Range Status   Enterococcus faecalis NOT DETECTED NOT DETECTED Final   Enterococcus Faecium NOT DETECTED NOT DETECTED Final   Listeria monocytogenes NOT DETECTED NOT DETECTED Final   Staphylococcus species NOT DETECTED NOT DETECTED Final   Staphylococcus aureus (BCID) NOT DETECTED NOT DETECTED Final   Staphylococcus epidermidis NOT DETECTED NOT DETECTED Final   Staphylococcus lugdunensis NOT DETECTED NOT DETECTED Final   Streptococcus species DETECTED (A) NOT DETECTED Final    Comment: CRITICAL RESULT CALLED TO, READ BACK BY AND VERIFIED WITH: PHARMD B BOLDEN [id number] AT 923 AM BY CM    Streptococcus agalactiae DETECTED (A) NOT DETECTED Final    Comment: CRITICAL RESULT CALLED TO, READ BACK BY AND VERIFIED WITH: PHARMD B BOLDEN [id number] AT 923 AM BY CM    Streptococcus pneumoniae NOT DETECTED NOT DETECTED Final   Streptococcus pyogenes NOT DETECTED NOT DETECTED Final   A.calcoaceticus-baumannii NOT DETECTED NOT DETECTED Final   Bacteroides fragilis NOT DETECTED NOT DETECTED Final   Enterobacterales NOT DETECTED NOT DETECTED Final   Enterobacter cloacae complex NOT DETECTED NOT DETECTED Final   Escherichia coli NOT DETECTED NOT DETECTED Final   Klebsiella aerogenes NOT DETECTED NOT DETECTED Final   Klebsiella oxytoca NOT DETECTED NOT DETECTED Final   Klebsiella pneumoniae NOT DETECTED NOT DETECTED Final   Proteus species NOT DETECTED NOT DETECTED Final   Salmonella species NOT DETECTED NOT DETECTED Final   Serratia marcescens NOT DETECTED NOT DETECTED Final   Haemophilus influenzae NOT  DETECTED NOT DETECTED Final   Neisseria meningitidis NOT DETECTED NOT DETECTED Final   Pseudomonas aeruginosa NOT DETECTED NOT DETECTED Final   Stenotrophomonas maltophilia NOT DETECTED NOT DETECTED Final   Candida albicans NOT DETECTED NOT DETECTED Final   Candida auris NOT DETECTED NOT DETECTED Final   Candida glabrata NOT DETECTED NOT DETECTED Final   Candida krusei NOT DETECTED NOT DETECTED Final   Candida parapsilosis NOT DETECTED NOT DETECTED Final   Candida tropicalis NOT DETECTED NOT DETECTED Final   Cryptococcus neoformans/gattii NOT DETECTED NOT DETECTED Final    Comment: Performed at [facility], [address] [place], [place] [address]  Urine Culture     Status: Abnormal   Collection Time: 07/22/21  3:26 AM   Specimen: In/Out Cath Urine  Result Value Ref Range Status   Specimen Description IN/OUT CATH URINE  Final   Special Requests NONE  Final   Culture (A)  Final    30,000 COLONIES/mL STREPTOCOCCUS AGALACTIAE TESTING AGAINST S. AGALACTIAE NOT ROUTINELY PERFORMED DUE TO PREDICTABILITY OF AMP/PEN/VAN SUSCEPTIBILITY. Performed at [facility], [place] [address], [place], [place] [address]    Report Status 07/23/2021 FINAL  Final  MRSA Next Gen by PCR, Nasal     Status: Abnormal   Collection Time: 07/22/21  5:02 PM   Specimen: Nasal Mucosa; Nasal Swab  Result Value Ref Range Status   MRSA by PCR Next Gen DETECTED (A) NOT DETECTED Final    Comment: RESULT CALLED TO, READ BACK BY AND VERIFIED WITH: ASHLEY ORE RN 07/22/2021 @2051  BY JW (NOTE) The GeneXpert MRSA Assay (FDA approved for NASAL specimens only), is one component of a comprehensive MRSA colonization surveillance program. It is not intended to diagnose MRSA infection nor to guide or monitor treatment for MRSA infections. Test performance is not FDA approved in patients less than 2 years  old. Performed at [facility], [place] [address] [place], [place] [address]   Surgical pcr screen      Status: Abnormal   Collection Time: 07/22/21  8:53 PM   Specimen: Nasal Mucosa; Nasal Swab  Result Value Ref Range Status   MRSA, PCR POSITIVE (A) NEGATIVE Final    Comment: RESULT CALLED TO, READ BACK BY AND VERIFIED WITH: A ORR,RN@2230  07/22/21 [place]    Staphylococcus aureus POSITIVE (A) NEGATIVE Final    Comment: (NOTE) The Xpert SA Assay (FDA approved for NASAL specimens in patients 62 years of age and older), is one component of a comprehensive surveillance program. It is not intended to diagnose infection nor to guide or monitor treatment. Performed at [facility], [place] [address], [place], [place] [address]   Culture, blood (routine x 2)     Status: None   Collection Time: 07/24/21 12:02 AM   Specimen: BLOOD  Result Value Ref Range Status   Specimen Description BLOOD RIGHT ARM  Final   Special Requests   Final    BOTTLES DRAWN AEROBIC AND ANAEROBIC Blood Culture adequate volume   Culture   Final    NO GROWTH 5 DAYS Performed at [facility], [address] [place], [place] [address]    Report Status 07/29/2021 FINAL  Final  Culture, blood (routine x 2)     Status: None   Collection Time: 07/24/21 12:02 AM   Specimen: BLOOD  Result Value Ref Range Status   Specimen Description BLOOD RIGHT ARM  Final   Special Requests   Final    BOTTLES DRAWN AEROBIC AND ANAEROBIC Blood Culture adequate volume   Culture   Final    NO GROWTH 5 DAYS Performed at [facility], [address], [place], [place] [address]    Report Status 07/29/2021 FINAL  Final          Radiology Studies: No results found.      Scheduled Meds:  [MAR Hold] amiodarone  200 mg Oral BID   [MAR Hold] apixaban  5 mg Oral BID   [MAR Hold] vitamin C  1,000 mg Oral Daily   [MAR Hold] docusate sodium  100 mg Oral Daily   [MAR Hold] gabapentin  300 mg Oral TID   [MAR Hold] insulin aspart  0-15 Units Subcutaneous TID WC   [MAR Hold] insulin aspart  0-5 Units Subcutaneous QHS   [MAR  Hold] insulin aspart  3 Units Subcutaneous TID WC   [MAR Hold] insulin detemir  15 Units Subcutaneous BID   [MAR Hold] metoprolol succinate  25 mg Oral Daily   [MAR Hold] nutrition supplement (JUVEN)  1 packet Oral BID BM   [MAR Hold] polyethylene glycol  17 g Oral Daily   [MAR Hold] zinc sulfate  220 mg Oral Daily   Continuous Infusions:  sodium chloride     sodium chloride     sodium chloride 20 mL/hr at 07/28/21 1622   sodium chloride     lactated ringers Stopped (07/23/21 1013)   [MAR Hold] penicillin g continuous IV infusion 12 Million Units (07/28/21 2327)          Aline August, MD [facility] 07/29/2021, 8:10 AM

## 2021-07-29 NOTE — Progress Notes (Signed)
Physical Therapy Treatment Patient Details Name: [name] MRN: [id number] DOB: May 23, 1966 Today's Date: 07/29/2021   History of Present Illness 55 yo admitted 11/14 with infected left foot s/p Left BKA 11/16. Pt with T6-8 discitis. PMhx: Rt BKA, Aflutter, DM, depression and anxiety    PT Comments    Patient progressing this session with getting OOB to drop arm BSC.  He needed +2 A for safety, but able to lift up while on BSC to allow some hygiene but fatigues quickly and in pain in multiple places.  Patient seems more eager to progress, but admits it was a lot harder than he thought it would be.  Continue to recommend acute inpatient rehab at d/c.    Recommendations for follow up therapy are one component of a multi-disciplinary discharge planning process, led by the attending physician.  Recommendations may be updated based on patient status, additional functional criteria and insurance authorization.  Follow Up Recommendations  Acute inpatient rehab (3hours/day)     Assistance Recommended at Discharge Frequent or constant Supervision/Assistance  Equipment Recommendations  Wheelchair (measurements PT);Wheelchair cushion (measurements PT)    Recommendations for Other Services       Precautions / Restrictions Precautions Precautions: Fall;Back Precaution Comments: wound vac L LE Required Braces or Orthoses: Other Brace Other Brace: LLE limb protector Restrictions Weight Bearing Restrictions: Yes LLE Weight Bearing: Non weight bearing     Mobility  Bed Mobility Overal bed mobility: Needs Assistance Bed Mobility: Rolling;Sidelying to Sit Rolling: Mod assist Sidelying to sit: Mod assist;+2 for safety/equipment       General bed mobility comments: assist for trunk with cues and scooting L hip; to supine assist for both legs and repositioning    Transfers Overall transfer level: Needs assistance   Transfers: Bed to chair/wheelchair/BSC            Lateral/Scoot  Transfers: Max assist;+2 physical assistance General transfer comment: using bed pad to scoot with pt assisting with UE's to get to [facility] then lateral leans to remove bed pad, then back to bed again using bed pad placed with pt lifting up with UE's on [facility]    Ambulation/Gait                   Stairs             Wheelchair Mobility    Modified Rankin (Stroke Patients Only)       Balance Overall balance assessment: Needs assistance Sitting-balance support: Bilateral upper extremity supported Sitting balance-Leahy Scale: Poor Sitting balance - Comments: UE support needed on BSC and EOB                                    Cognition Arousal/Alertness: Awake/alert Behavior During Therapy: WFL for tasks assessed/performed Overall Cognitive Status: Within Functional Limits for tasks assessed                                          Exercises      General Comments General comments (skin integrity, edema, etc.): Assisted with hygiene on BSC pt lifting up with UE's, but continued with smear on washcloth.  Noted skin tear at apex of gluteal cleft and RN placed new sacral pad      Pertinent Vitals/Pain Pain Assessment: Faces Faces Pain Scale: Hurts whole lot Pain  Location: back, scrotum, L LE Pain Descriptors / Indicators: Grimacing;Guarding;Aching;Moaning Pain Intervention(s): Monitored during session;Repositioned;Premedicated before session    Home Living                          Prior Function            PT Goals (current goals can now be found in the care plan section) Progress towards PT goals: Progressing toward goals    Frequency    Min 3X/week      PT Plan Current plan remains appropriate    Co-evaluation              AM-PAC PT "6 Clicks" Mobility   Outcome Measure  Help needed turning from your back to your side while in a flat bed without using bedrails?: A Lot Help needed moving from lying on  your back to sitting on the side of a flat bed without using bedrails?: A Lot Help needed moving to and from a bed to a chair (including a wheelchair)?: Total Help needed standing up from a chair using your arms (e.g., wheelchair or bedside chair)?: Total Help needed to walk in hospital room?: Total Help needed climbing 3-5 steps with a railing? : Total 6 Click Score: 8    End of Session   Activity Tolerance: Patient limited by pain Patient left: in bed;with call bell/phone within reach Nurse Communication: Mobility status PT Visit Diagnosis: Other abnormalities of gait and mobility (R26.89);Muscle weakness (generalized) (M62.81)     Time: 6979-4801 PT Time Calculation (min) (ACUTE ONLY): 38 min  Charges:  $Therapeutic Activity: 38-52 mins                     [name], PT Acute Rehabilitation Services [id number]:[phone] Office:[phone] 07/29/2021    [name] 07/29/2021, 5:33 PM

## 2021-07-29 NOTE — Transfer of Care (Signed)
Immediate Anesthesia Transfer of Care Note  Patient: [name]  Procedure(s) Performed: TRANSESOPHAGEAL ECHOCARDIOGRAM (TEE) CARDIOVERSION BUBBLE STUDY  Patient Location: Endoscopy Unit  Anesthesia Type:General  Level of Consciousness: drowsy  Airway & Oxygen Therapy: Patient Spontanous Breathing and Patient connected to nasal cannula oxygen  Post-op Assessment: Report given to RN and Post -op Vital signs reviewed and stable  Post vital signs: Reviewed and stable  Last Vitals:  Vitals Value Taken Time  BP    Temp    Pulse 79 07/29/21 0852  Resp 21 07/29/21 0852  SpO2 97 % 07/29/21 0852  Vitals shown include unvalidated device data.  Last Pain:  Vitals:   07/29/21 0754  TempSrc:   PainSc: 8       Patients Stated Pain Goal: 0 (21/58/72 7618)  Complications: No notable events documented.

## 2021-07-29 NOTE — Plan of Care (Signed)
TEE negative for vegetation. Plan as below, discussed with patient previously. 2 weeks of parenteral therapy with penicillin form 07/23/21(if Cx remain negative)-08/05/21 then transition to amoxicillin 1gm tid(EOT 09/14/21) to complete 8 weeks of antibiotics (OM). -ID will sign off Clinic Follow Up Appt: 08/25/21 @ [facility] with Dr. Candiss Norse

## 2021-07-29 NOTE — Progress Notes (Signed)
Prior to TEE patient requested to change code status to partial code with no CPR or intubation but ok for IV medications needed and BiPAP.  Order was placed in EPIC

## 2021-07-29 NOTE — Interval H&P Note (Signed)
History and Physical Interval Note:  07/29/2021 7:51 AM  [name]  has presented today for surgery, with the diagnosis of A-FLUTTER.  The various methods of treatment have been discussed with the patient and family. After consideration of risks, benefits and other options for treatment, the patient has consented to  Procedure(s): TRANSESOPHAGEAL ECHOCARDIOGRAM (TEE) (N/A) CARDIOVERSION (N/A) as a surgical intervention.  The patient's history has been reviewed, patient examined, no change in status, stable for surgery.  I have reviewed the patient's chart and labs.  Questions were answered to the patient's satisfaction.     Fransico Him

## 2021-07-29 NOTE — Progress Notes (Signed)
Inpatient Rehab Admissions Coordinator:   Met with patient to discuss CIR recommendations and goals/expectations of CIR stay.  I reviewed average length of stay to be about 2 weeks, and individually it depends on progress.  I think pt could potentially reach supervision to intermittent supervision level with CIR admission.  We discussed need for insurance authorization and I will start this request process today.  We will continue to follow.   Shann Medal, PT, DPT Admissions Coordinator [phone] 07/29/21  2:40 PM

## 2021-07-29 NOTE — Anesthesia Postprocedure Evaluation (Signed)
Anesthesia Post Note  Patient: [name]  Procedure(s) Performed: TRANSESOPHAGEAL ECHOCARDIOGRAM (TEE) CARDIOVERSION BUBBLE STUDY     Patient location during evaluation: Endoscopy Anesthesia Type: General Level of consciousness: awake and alert Pain management: pain level controlled Vital Signs Assessment: post-procedure vital signs reviewed and stable Respiratory status: spontaneous breathing, nonlabored ventilation, respiratory function stable and patient connected to nasal cannula oxygen Cardiovascular status: stable and blood pressure returned to baseline Postop Assessment: no apparent nausea or vomiting Anesthetic complications: no   No notable events documented.  Last Vitals:  Vitals:   07/29/21 0900 07/29/21 0910  BP: (!) 112/58 (!) 122/59  Pulse: 81 80  Resp: (!) 25 20  Temp:    SpO2: 95% 97%    Last Pain:  Vitals:   07/29/21 1113  TempSrc:   PainSc: Asleep                 Bertel Venard,W. EDMOND

## 2021-07-29 NOTE — Progress Notes (Signed)
PT Cancellation Note  Patient Details Name: Fin Hupp MRN: [id number] DOB: [birth year]   Cancelled Treatment:    Reason Eval/Treat Not Completed: Patient at procedure or test/unavailable; patient down for procedure (TEE).  Will attempt later as schedule permits and pt able.    [name] 07/29/2021, 8:51 AM Magda Kiel, PT Acute Rehabilitation Services OBSJG:[phone] Office:[phone] 07/29/2021

## 2021-07-29 NOTE — CV Procedure (Signed)
    PROCEDURE NOTE:  Procedure:  Transesophageal echocardiogram Operator:  Fransico Him, MD Indications:  Bacteremia and Atrial flutter Complications: None  During this procedure the patient is administered a total of Lidocaine 80 mg and Propofol 160 mg to achieve and maintain moderate conscious sedation.  The patient's heart rate, blood pressure, and oxygen saturation are monitored continuously during the procedure by anesthesia.   Results: Moderately reduced LVF with EF 35% Moderately reduced RVF Mildly dilated RA Mildly dilated LA Normal TV with mild TR Normal PV Normal MV apparatus with mildly calcified posterior MVL with trivial MR Normal trileaflet AV with trivial AR Hypermobile interatrial septum with evidence of possible small shunt by colorflow dopper and agitated saline contrast injection consistent with possible small PFO Normal thoracic and ascending aorta. No evidence of vegetation  The patient tolerated the procedure well and went on for DCCV  Signed: Fransico Him, MD [facility]    Electrical Cardioversion Procedure Note [name] [id number] [birth year]  Procedure: Electrical Cardioversion Indications:  Atrial Flutter  Time Out: Verified patient identification, verified procedure,medications/allergies/relevent history reviewed, required imaging and test results available.  Performed  Procedure Details  The patient was NPO after midnight. Anesthesia was administered at the beside (see above TEE report) by Dr.Fitzgerald Cardioversion was done with synchronized biphasic defibrillation with AP pads with 150watts.  The patient converted to normal sinus rhythm. The patient tolerated the procedure well   IMPRESSION:  Successful cardioversion of atrial flutter with RVR    [name] 07/29/2021, 7:56 AM

## 2021-07-29 NOTE — Progress Notes (Signed)
Echocardiogram Echocardiogram Transesophageal has been performed.  Oneal Deputy Charlei Ramsaran RDCS 07/29/2021, 8:54 AM

## 2021-07-30 DIAGNOSIS — R6520 Severe sepsis without septic shock: Secondary | ICD-10-CM | POA: Diagnosis not present

## 2021-07-30 DIAGNOSIS — N179 Acute kidney failure, unspecified: Secondary | ICD-10-CM | POA: Diagnosis not present

## 2021-07-30 DIAGNOSIS — A419 Sepsis, unspecified organism: Secondary | ICD-10-CM | POA: Diagnosis not present

## 2021-07-30 DIAGNOSIS — E1142 Type 2 diabetes mellitus with diabetic polyneuropathy: Secondary | ICD-10-CM | POA: Diagnosis not present

## 2021-07-30 DIAGNOSIS — Z794 Long term (current) use of insulin: Secondary | ICD-10-CM | POA: Diagnosis not present

## 2021-07-30 DIAGNOSIS — M4644 Discitis, unspecified, thoracic region: Secondary | ICD-10-CM | POA: Diagnosis not present

## 2021-07-30 LAB — GLUCOSE, CAPILLARY
Glucose-Capillary: 389 mg/dL — ABNORMAL HIGH (ref 70–99)
Glucose-Capillary: 319 mg/dL — ABNORMAL HIGH (ref 70–99)
Glucose-Capillary: 156 mg/dL — ABNORMAL HIGH (ref 70–99)
Glucose-Capillary: 270 mg/dL — ABNORMAL HIGH (ref 70–99)

## 2021-07-30 LAB — BASIC METABOLIC PANEL
Anion gap: 7 (ref 5–15)
BUN: 34 mg/dL — ABNORMAL HIGH (ref 6–20)
CO2: 25 mmol/L (ref 22–32)
Calcium: 7.9 mg/dL — ABNORMAL LOW (ref 8.9–10.3)
Chloride: 96 mmol/L — ABNORMAL LOW (ref 98–111)
Creatinine, Ser: 1.43 mg/dL — ABNORMAL HIGH (ref 0.61–1.24)
GFR, Estimated: 58 mL/min — ABNORMAL LOW (ref >60)
Glucose, Bld: 359 mg/dL — ABNORMAL HIGH (ref 70–99)
Potassium: 4.7 mmol/L (ref 3.5–5.1)
Sodium: 128 mmol/L — ABNORMAL LOW (ref 135–145)

## 2021-07-30 LAB — CBC
HCT: 40.2 % (ref 39.0–52.0)
Hemoglobin: 13.6 g/dL (ref 13.0–17.0)
MCH: 28.7 pg (ref 26.0–34.0)
MCHC: 33.8 g/dL (ref 30.0–36.0)
MCV: 84.8 fL (ref 80.0–100.0)
Platelets: 297 10*3/uL (ref 150–400)
RBC: 4.74 MIL/uL (ref 4.22–5.81)
RDW: 15.6 % — ABNORMAL HIGH (ref 11.5–15.5)
WBC: 15 10*3/uL — ABNORMAL HIGH (ref 4.0–10.5)
nRBC: 0 % (ref 0.0–0.2)

## 2021-07-30 LAB — MAGNESIUM: Magnesium: 1.6 mg/dL — ABNORMAL LOW (ref 1.7–2.4)

## 2021-07-30 MED ORDER — INSULIN ASPART 100 UNIT/ML IJ SOLN
5.0000 [IU] | Freq: Three times a day (TID) | INTRAMUSCULAR | Status: DC
  Administered 2021-07-31 – 2021-08-06 (×18): 5 [IU] via SUBCUTANEOUS

## 2021-07-30 MED ORDER — MAGNESIUM SULFATE 2 GM/50ML IV SOLN
2.0000 g | Freq: Once | INTRAVENOUS | Status: AC
  Administered 2021-07-30: 2 g via INTRAVENOUS
  Filled 2021-07-30: qty 50

## 2021-07-30 MED ORDER — OXYCODONE HCL ER 10 MG PO T12A
10.0000 mg | EXTENDED_RELEASE_TABLET | Freq: Two times a day (BID) | ORAL | Status: DC
  Administered 2021-07-30 – 2021-08-03 (×9): 10 mg via ORAL
  Filled 2021-07-30 (×9): qty 1

## 2021-07-30 MED ORDER — METOLAZONE 5 MG PO TABS
2.5000 mg | ORAL_TABLET | Freq: Once | ORAL | Status: AC
  Administered 2021-07-30: 2.5 mg via ORAL
  Filled 2021-07-30: qty 1

## 2021-07-30 MED ORDER — FUROSEMIDE 10 MG/ML IJ SOLN
80.0000 mg | Freq: Once | INTRAMUSCULAR | Status: AC
Start: 2021-07-30 — End: 2021-07-30
  Administered 2021-07-30: 80 mg via INTRAVENOUS
  Filled 2021-07-30: qty 8

## 2021-07-30 NOTE — Progress Notes (Signed)
Orthopedic Tech Progress Note Patient Details:  [name] [birth year] [id number]  Some lady called requesting a new SHRINKER for patient. Told her I  needed an order before I could call. [name], male   DOB: 07-04-1966, 55 y.o.   MRN: [id number]  [name] 07/30/2021, 10:00 AM

## 2021-07-30 NOTE — Progress Notes (Signed)
Inpatient Diabetes Program Recommendations  AACE/ADA: New Consensus Statement on Inpatient Glycemic Control (2015)  Target Ranges:  Prepandial:   less than 140 mg/dL      Peak postprandial:   less than 180 mg/dL (1-2 hours)      Critically ill patients:  140 - 180 mg/dL   Lab Results  Component Value Date   GLUCAP 389 (H) 07/30/2021   HGBA1C 13.8 (H) 07/22/2021    Latest Reference Range & Units 07/29/21 07:35 07/29/21 11:46 07/29/21 16:29 07/29/21 21:25 07/30/21 08:09  Glucose-Capillary 70 - 99 mg/dL 217 (H) 161 (H) 239 (H) 315 (H) 389 (H)   Review of Glycemic Control  Current orders for Inpatient glycemic control:  Levemir 18 units bid Novolog 0-15 units tid + hs Novolog 3 units tid  Inpatient Diabetes Program Recommendations:    - Consider increasing Levemir to 22 units bid -  Increase Novolog meal coverage to 6 units tid  Thanks,  Tama Headings RN, MSN, BC-ADM Inpatient Diabetes Coordinator Team Pager [phone] (8a-5p)

## 2021-07-30 NOTE — Progress Notes (Signed)
Physical Therapy Treatment Patient Details Name: [name] MRN: [id number] DOB: 30-Apr-1966 Today's Date: 07/30/2021   History of Present Illness 55 yo admitted 11/14 with infected left foot s/p Left BKA 11/16. Pt with T6-8 discitis. PMhx: Rt BKA, Aflutter, DM, depression and anxiety    PT Comments    Patient with progress this session improved comfort and safety with anterior-posterior transfer into recliner.  Also noted some improvement in scrotal edema compared to yesterday.  Called to get new shrinker for R LE to help possibly get swelling down for fitting prosthetic.  Patient's son present this session and engaged.  Feel follow up acute inpatient rehab indicated to maximize safety and independence prior to d/c home.    Recommendations for follow up therapy are one component of a multi-disciplinary discharge planning process, led by the attending physician.  Recommendations may be updated based on patient status, additional functional criteria and insurance authorization.  Follow Up Recommendations  Acute inpatient rehab (3hours/day)     Assistance Recommended at Discharge Frequent or constant Supervision/Assistance  Equipment Recommendations  Wheelchair (measurements PT);Wheelchair cushion (measurements PT)    Recommendations for Other Services       Precautions / Restrictions Precautions Precautions: Fall Precaution Comments: wound vac L LE Required Braces or Orthoses: Other Brace Other Brace: LLE limb protector Restrictions Weight Bearing Restrictions: Yes LLE Weight Bearing: Non weight bearing     Mobility  Bed Mobility Overal bed mobility: Needs Assistance Bed Mobility: Rolling;Sidelying to Sit Rolling: Mod assist Sidelying to sit: Mod assist;+2 for safety/equipment       General bed mobility comments: assist for trunk with pain with transition    Transfers Overall transfer level: Needs assistance   Transfers: Bed to chair/wheelchair/BSC          Anterior-Posterior transfers: Mod assist;+2 physical assistance   General transfer comment: using bed pad to scoot back on bed pt cued to push with UE's and reports improved comfort over lateral transfer yesterday    Ambulation/Gait                   Stairs             Wheelchair Mobility    Modified Rankin (Stroke Patients Only)       Balance Overall balance assessment: Needs assistance Sitting-balance support: Bilateral upper extremity supported Sitting balance-Leahy Scale: Poor Sitting balance - Comments: UE support for balance but sits with S despite back pain                                    Cognition Arousal/Alertness: Awake/alert Behavior During Therapy: WFL for tasks assessed/performed Overall Cognitive Status: Within Functional Limits for tasks assessed                                          Exercises      General Comments        Pertinent Vitals/Pain Faces Pain Scale: Hurts whole lot Pain Location: back, scrotum, L LE Pain Descriptors / Indicators: Grimacing;Guarding;Aching;Moaning Pain Intervention(s): Monitored during session;Repositioned;Premedicated before session    Home Living                          Prior Function            PT  Goals (current goals can now be found in the care plan section) Progress towards PT goals: Progressing toward goals    Frequency    Min 3X/week      PT Plan Current plan remains appropriate    Co-evaluation              AM-PAC PT "6 Clicks" Mobility   Outcome Measure  Help needed turning from your back to your side while in a flat bed without using bedrails?: A Lot Help needed moving from lying on your back to sitting on the side of a flat bed without using bedrails?: A Lot Help needed moving to and from a bed to a chair (including a wheelchair)?: A Lot Help needed standing up from a chair using your arms (e.g., wheelchair or bedside  chair)?: Total Help needed to walk in hospital room?: Total Help needed climbing 3-5 steps with a railing? : Total 6 Click Score: 9    End of Session   Activity Tolerance: Patient limited by pain Patient left: in chair;with call bell/phone within reach;with chair alarm set;with family/visitor present Nurse Communication: Mobility status PT Visit Diagnosis: Other abnormalities of gait and mobility (R26.89);Muscle weakness (generalized) (M62.81)     Time: 1410-3013 PT Time Calculation (min) (ACUTE ONLY): 29 min  Charges:  $Therapeutic Activity: 23-37 mins                     [name], PT Acute Rehabilitation Services HYHOO:[phone] Office:[phone] 07/30/2021    [name] 07/30/2021, 5:54 PM

## 2021-07-30 NOTE — Progress Notes (Signed)
Orthopedic Tech Progress Note Patient Details:  Joshue Badal Aug 21, 1966 [id number]  Called in order to [employer] for a BKA SHRINKER   Patient ID: Jeromy Borcherding, male   DOB: 21-Jun-1966, 55 y.o.   MRN: [id number]  Janit Pagan 07/30/2021, 11:07 AM

## 2021-07-30 NOTE — Care Management (Signed)
07/30/21 1227 CIR is following the patient for a possible CIR admission- awaiting to hear back from [facility]. Patient continues on Penicillin G and per notes will be transitioned to Ampicillin after 08-05-21. Transitions of Care team will continue to follow.

## 2021-07-30 NOTE — Progress Notes (Signed)
Progress Note  Patient Name: [name] Date of Encounter: 07/30/2021  [facility] Cardiologist: Sanda Klein, MD   Subjective    Patient denies CP  Breathing is fair  Inpatient Medications    Scheduled Meds:  amiodarone  200 mg Oral BID   [START ON 08/06/2021] amoxicillin  1,000 mg Oral Q8H   apixaban  5 mg Oral BID   vitamin C  1,000 mg Oral Daily   docusate sodium  100 mg Oral Daily   gabapentin  300 mg Oral TID   insulin aspart  0-15 Units Subcutaneous TID WC   insulin aspart  0-5 Units Subcutaneous QHS   insulin aspart  3 Units Subcutaneous TID WC   insulin detemir  18 Units Subcutaneous BID   metoprolol succinate  25 mg Oral Daily   nutrition supplement (JUVEN)  1 packet Oral BID BM   polyethylene glycol  17 g Oral Daily   zinc sulfate  220 mg Oral Daily   Continuous Infusions:  sodium chloride 10 mL/hr at 07/29/21 0816   lactated ringers Stopped (07/23/21 1013)   magnesium sulfate bolus IVPB 2 g (07/30/21 0732)   penicillin g continuous IV infusion 12 Million Units (07/29/21 2137)   PRN Meds: acetaminophen, alum & mag hydroxide-simeth, bisacodyl, dextrose, diphenhydrAMINE, guaiFENesin-dextromethorphan, hydrALAZINE, HYDROmorphone (DILAUDID) injection, labetalol, ondansetron, oxyCODONE, phenol, polyethylene glycol   Vital Signs    Vitals:   07/30/21 0106 07/30/21 0436 07/30/21 0554 07/30/21 0645  BP: (!) 161/88 (!) 149/97 (!) 177/101 (!) 155/90  Pulse: 79   86  Resp: 16  19 18   Temp: 98.7 F (37.1 C)  98.4 F (36.9 C)   TempSrc:   Oral   SpO2: 99%  96%   Weight:      Height:        Intake/Output Summary (Last 24 hours) at 07/30/2021 0829 Last data filed at 07/30/2021 0732 Gross per 24 hour  Intake 1795.3 ml  Output 3875 ml  Net -2079.7 ml   Positive 3 L from admit  Last 3 Weights 07/29/2021 07/27/2021 07/23/2021  Weight (lbs) 238 lb 8.6 oz 238 lb 8.6 oz 240 lb  Weight (kg) 108.2 kg 108.2 kg 108.863 kg      Telemetry    Now SR  80s-Reviewed   Physical Exam   GEN: No acute distress.   Neck: Neck is full  JVP increased  Cardiac:  RRR  Gr I/VI shsytolic murmur   Respiratory: Clear to auscultation bilaterally.anteirorly GU  Scrotal edema persists  GI: Soft, nontender, non-distended  MS: UE edema  s/p R BKA Neuro:  Nonfocal  Psych: Normal affect   Labs    High Sensitivity Troponin:   Recent Labs  Lab 07/22/21 0246 07/22/21 0645  TROPONINIHS 52* 44*     Chemistry Recent Labs  Lab 07/28/21 0055 07/29/21 0210 07/30/21 0144  NA 132* 132* 128*  K 4.3 4.6 4.7  CL 101 100 96*  CO2 24 24 25   GLUCOSE 252* 268* 359*  BUN 59* 44* 34*  CREATININE 1.75* 1.58* 1.43*  CALCIUM 8.0* 8.2* 7.9*  MG 1.9 1.6* 1.6*  GFRNONAA 45* 51* 58*  ANIONGAP 7 8 7     Lipids No results for input(s): CHOL, TRIG, HDL, LABVLDL, LDLCALC, CHOLHDL in the last 168 hours.  Hematology Recent Labs  Lab 07/28/21 0055 07/29/21 0210 07/30/21 0144  WBC 17.1* 15.4* 15.0*  RBC 5.07 5.02 4.74  HGB 14.1 14.0 13.6  HCT 42.2 43.1 40.2  MCV 83.2 85.9 84.8  MCH 27.8 27.9 28.7  MCHC 33.4 32.5 33.8  RDW 15.7* 15.8* 15.6*  PLT 223 280 297   Thyroid No results for input(s): TSH, FREET4 in the last 168 hours.  BNP Recent Labs  Lab 07/27/21 0126  BNP 1,368.1*    DDimer No results for input(s): DDIMER in the last 168 hours.   Radiology    No results found.  Cardiac Studies     Patient Profile     55 y.o. male with a hx of HFpEF (EF = 50%), HTN, DM2, CKD 3, osteomyelitis s/p R BKA who is being seen 07/22/2021 for the evaluation new diagnosis of systolic dysfunction  Assessment & Plan    Systolic dysfunction (new diagnosis this admission) - 07/2021 echo LVEF 90%, indet diastolic fxn, mod RV dysfunction, PASP is 53 - 04/2021 echo 50-55% - may be tachy mediated CM givne also diagnosed with new onset aflutter with RVR, or possibly stress induced CM given bateremia and septic shock.  Cardioverted yesterday     Did not respond to  IV lasix 60   Will try Zaroxylyn 2.5 and 80 lasix IV  Strict I/O  2. New onset Aflutter - started on amio this admission, IV has been transitioned to oral 200 daily - started on eliquis. Age 55, wt 108 kg, Cr 2.8 would increase eliquis to 5mg  bid -Cardioverted   Maintaining   SR     Follow EF in a few monts   3. Sepsis/bacteremia strep Agalactiae Bacteremia due to Diabetic Foot Infection  Initially septic shock, now off pressors TEE negaative for endocarditis      4. AKI on CKD  Cr continues to improve   1.43   Follow closely With attempts at diuresis   For questions or updates, please contact [place] Please consult [email or web address] for contact info under        Signed, Dorris Carnes, MD  07/30/2021, 8:29 AM

## 2021-07-30 NOTE — Plan of Care (Signed)
  Problem: Education: Goal: Knowledge of General Education information will improve Description Including pain rating scale, medication(s)/side effects and non-pharmacologic comfort measures Outcome: Progressing   Problem: Health Behavior/Discharge Planning: Goal: Ability to manage health-related needs will improve Outcome: Progressing   

## 2021-07-30 NOTE — Progress Notes (Signed)
Inpatient Rehab Admissions Coordinator:   Awaiting determination from [facility]. I will not have a bed for this patient today or tomorrow.  Shann Medal, PT, DPT Admissions Coordinator [phone] 07/30/21  9:05 AM

## 2021-07-30 NOTE — Progress Notes (Signed)
[name], male   DOB: 29-Mar-1966, 55 y.o.   MRN: [id number]  PROGRESS NOTE    [name]  OEU:[id number] DOB: 1966-01-18 DOA: 07/21/2021 PCP: Patient, No Pcp Per (Inactive)   Brief Narrative:  55 year old male with history of diabetes mellitus type 2, diabetic foot infection with prior right BKA, chronic diastolic CHF, hypertension, dyslipidemia presented with worsening left foot swelling/discoloration and discharge.  On presentation, he was hypotensive, tachycardic with A. fib with RVR with CT suggestive of first MTP osteomyelitis and soft tissue gas concerning for possible necrotizing fasciitis along with AKI with creatinine of 2.7 and mild DKA.  His blood pressures did not improve with IV fluids and he was transferred to ICU and PCCM was consulted.  Orthopedics was consulted.  He underwent left transtibial amputation on 07/23/2021.  He was found to have strep agalactiae bacteremia.  ID was consulted.  He was found to have vertebral discitis versus osteomyelitis on MRI.  He was also found to have new systolic dysfunction with LVEF of 35% along with new onset atrial flutter requiring IV amiodarone which has been switched to oral.  Cardiology was consulted.  He has been started on Eliquis.  S/p TEE followed by DCCV on 11/22.  Assessment & Plan:   Septic shock: Evolving on admission from bacteremia/diabetic foot infection -Off pressors.  Shock resolved. -Required care in ICU under PCCM service: Care has been transferred to [facility] from 07/27/2021 onwards  Diabetic foot infection, left -Status post left transtibial amputation on 07/23/2021.  Wound/wound VAC care as per Dr. Sharol Given.  Group B streptococcus bacteremia, Possible thoracic discitis/osteomyelitis -TTE showed no vegetation.   -MRI of cervical/thoracic/lumbar spine showed possible thoracic osteomyelitis/discitis without evidence of abscess but MRI was without contrast.   - Neurosurgery evaluation appreciated: Not convinced  that patient has epidural abscess.  Recommended to manage conservatively with IV antibiotics. -Antibiotics have been changed to penicillin.  Patient will probably need 8 weeks of antibiotics per ID (2 weeks of parenteral therapy with penicillin from 07/23/2021-08/05/2021; then transition to amoxicillin 1 g 3 times daily till 09/14/2021). -Repeat blood cultures from 07/24/2021 have been negative so far -TEE 11/22 without evidence of vegetation.  AKI on CKD stage IV, Acute metabolic acidosis -Presented with creatinine of 2.76.  Creatinine peaked to 3.29. -Creatinine gradually improving, down to 1.43 today.   -Acidosis resolved. -Avoid nephrotoxins.  Hyponatremia -Corrected serum sodium to hyperglycemia is 132-134.  New systolic dysfunction in a patient with chronic diastolic heart failure -Echo showed EF of 35% (previous echo showed EF of 50 to 55% in 04/2021) -Strict input output.  Daily weights.  Fluid restriction.  Continue conservative management.  Continue metoprolol succinate. -Cardiology following. -Weight and intake output documentation probably not fully accurate -As discussed with Cardiology, the cardiomyopathy may be tachycardia mediated in the context of new onset atrial flutter with RVR or possibly stress-induced cardiomyopathy from bacteremia and septic shock.  Did not respond to IV Lasix 60 mg and hence today trying Lasix 80 mg IV along with Zaroxolyn 2.5 Mg. -Remains volume overloaded.  New onset atrial flutter -Continue Toprol-XL. -IV amiodarone now switched to 200 Mg daily -Continue Eliquis 5 Mg twice daily -S/p TEE guided DCCV on 11/22 and currently in sinus rhythm. -Follow-up 2D echo in a couple of months. -Cardiology following.  Hypomagnesemia -Replace.  Repeat a.m. labs  Leukocytosis -Still significant but improving.  Monitor.  Appears to have plateaued off.  Diabetes mellitus type 2 with hyperglycemia -Continue Lantus along with CBGs with  SSI -Appears to be  widely fluctuating.  Will increase mealtime NovoLog to 5 units.  Will likely need further titration of insulins.  Thrombocytopenia -Resolved  Generalized deconditioning -PT recommends CIR.  A consult to CIR. as per TOC, patient's insurance declined CIR admission and plan to do peer to peer with Psychologist, occupational when arranged by TOC.  Acute on chronic pain -Patient has received multiple doses of IV and oral opioids and reports uncontrolled pain. -Obviously has reasons for acute brain from recent surgery, discitis/osteomyelitis. -Initiated OxyContin 10 Mg every 12 hours.  Monitor closely.  DVT prophylaxis: Apixaban Code Status: Full Family Communication: None at bedside Disposition Plan: Status is: Inpatient  Remains inpatient appropriate because: IV Lasix and further diuresis for decompensated CHF.  Consultants: Cardiology/orthopedic/ID/neurosurgery/PCCM/neurosurgery  Procedures: Left transtibial amputation on 07/23/2021.  TEE and DCCV 11/22.  Antimicrobials:  Anti-infectives (From admission, onward)    Start     Dose/Rate Route Frequency Ordered Stop   08/06/21 0600  amoxicillin (AMOXIL) capsule 1,000 mg        1,000 mg Oral Every 8 hours 07/29/21 1001 09/17/21 0559   07/28/21 1000  penicillin G potassium 12 Million Units in dextrose 5 % 500 mL continuous infusion        12 Million Units 41.7 mL/hr over 12 Hours Intravenous Every 12 hours 07/28/21 0846 08/05/21 2359   07/25/21 1000  penicillin G potassium 8 Million Units in dextrose 5 % 500 mL continuous infusion  Status:  Discontinued        8 Million Units 41.7 mL/hr over 12 Hours Intravenous Every 12 hours 07/25/21 0854 07/28/21 0846   07/23/21 1345  cefTRIAXone (ROCEPHIN) 2 g in sodium chloride 0.9 % 100 mL IVPB  Status:  Discontinued        2 g 200 mL/hr over 30 Minutes Intravenous Every 24 hours 07/23/21 1246 07/25/21 0854   07/23/21 0815  ceFAZolin (ANCEF) IVPB 2g/100 mL premix  Status:  Discontinued        2  g 200 mL/hr over 30 Minutes Intravenous To ShortStay Surgical 07/23/21 0622 07/23/21 0836   07/22/21 2000  linezolid (ZYVOX) IVPB 600 mg  Status:  Discontinued        600 mg 300 mL/hr over 60 Minutes Intravenous Every 12 hours 07/22/21 1544 07/23/21 1349   07/22/21 0800  piperacillin-tazobactam (ZOSYN) IVPB 3.375 g  Status:  Discontinued        3.375 g 12.5 mL/hr over 240 Minutes Intravenous Every 8 hours 07/22/21 0107 07/23/21 1246   07/22/21 0130  clindamycin (CLEOCIN) IVPB 600 mg  Status:  Discontinued        600 mg 100 mL/hr over 30 Minutes Intravenous Every 8 hours 07/22/21 0101 07/22/21 1544   07/22/21 0115  piperacillin-tazobactam (ZOSYN) IVPB 3.375 g        3.375 g 100 mL/hr over 30 Minutes Intravenous STAT 07/22/21 0107 07/22/21 0223   07/21/21 2335  vancomycin variable dose per unstable renal function (pharmacist dosing)  Status:  Discontinued         Does not apply See admin instructions 07/21/21 2335 07/22/21 1544   07/21/21 2000  vancomycin (VANCOREADY) IVPB 2000 mg/400 mL        2,000 mg 200 mL/hr over 120 Minutes Intravenous  Once 07/21/21 1955 07/22/21 0004   07/21/21 2000  cefTRIAXone (ROCEPHIN) 2 g in sodium chloride 0.9 % 100 mL IVPB        2 g 200 mL/hr over 30 Minutes Intravenous  Once  07/21/21 1955 07/21/21 2130        Subjective: Patient interviewed and examined along with his RN in the room.  Reports severe mid back pain, unable to control with current regimen and asking to escalate regimen.  Also stating that the current IV antibiotics are not working because he still has back pain-counseled him that this will take several weeks to gradually improve.  Objective: Vitals:   07/30/21 0436 07/30/21 0554 07/30/21 0645 07/30/21 1345  BP: (!) 149/97 (!) 177/101 (!) 155/90 111/78  Pulse:   86 77  Resp:  19 18 19   Temp:  98.4 F (36.9 C)  98.2 F (36.8 C)  TempSrc:  Oral  Oral  SpO2:  96%  96%  Weight:      Height:        Intake/Output Summary (Last 24  hours) at 07/30/2021 1724 Last data filed at 07/30/2021 1658 Gross per 24 hour  Intake 1394.7 ml  Output 4925 ml  Net -3530.3 ml   Filed Weights   07/23/21 0827 07/27/21 0252 07/29/21 0754  Weight: 108.9 kg 108.2 kg 108.2 kg    Examination:  General exam: Young male, moderately built and overweight lying propped up in bed, uncomfortable and in some painful distress. Respiratory system: Slightly diminished breath sounds in the bases but otherwise clear to auscultation.  No increased work of breathing. cardiovascular system: S1 and S2 heard, RRR.  No JVD, murmurs.  Does have bilateral thigh and scrotal edema.  Healed right BKA stump.  Left transtibial amputation with dressing intact. gastrointestinal system: Abdomen is mildly distended, soft and nontender.  Normal bowel sounds heard  extremities: Left BKA with amputation brace present; right lower extremity BKA present Central nervous system: Alert and oriented.  No focal neurological deficits noted  skin: No obvious ecchymosis/lesions  psychiatry: Anxious.  Data Reviewed: I have personally reviewed following labs and imaging studies  CBC: Recent Labs  Lab 07/26/21 0055 07/27/21 0126 07/28/21 0055 07/29/21 0210 07/30/21 0144  WBC 13.4* 16.0* 17.1* 15.4* 15.0*  HGB 14.2 15.1 14.1 14.0 13.6  HCT 40.7 45.4 42.2 43.1 40.2  MCV 81.7 84.2 83.2 85.9 84.8  PLT 85* 140* 223 280 627   Basic Metabolic Panel: Recent Labs  Lab 07/26/21 0055 07/27/21 0126 07/28/21 0055 07/29/21 0210 07/30/21 0144  NA 127* 130* 132* 132* 128*  K 3.7 3.7 4.3 4.6 4.7  CL 100 100 101 100 96*  CO2 19* 22 24 24 25   GLUCOSE 157* 255* 252* 268* 359*  BUN 94* 78* 59* 44* 34*  CREATININE 2.83* 2.25* 1.75* 1.58* 1.43*  CALCIUM 7.7* 7.9* 8.0* 8.2* 7.9*  MG 2.2 2.2 1.9 1.6* 1.6*   GFR: Estimated Creatinine Clearance: 77.6 mL/min (A) (by C-G formula based on SCr of 1.43 mg/dL (H)). Liver Function Tests: No results for input(s): AST, ALT, ALKPHOS,  BILITOT, PROT, ALBUMIN in the last 168 hours.   Coagulation Profile: Recent Labs  Lab 07/28/21 1557  INR 1.6*   Cardiac Enzymes: Recent Labs  Lab 07/24/21 1010  CKTOTAL 265    CBG: Recent Labs  Lab 07/29/21 1629 07/29/21 2125 07/30/21 0809 07/30/21 1222 07/30/21 1657  GLUCAP 239* 315* 389* 319* 156*    Recent Results (from the past 240 hour(s))  Resp Panel by RT-PCR (Flu A&B, Covid) Nasopharyngeal Swab     Status: None   Collection Time: 07/21/21  7:54 PM   Specimen: Nasopharyngeal Swab; Nasopharyngeal(NP) swabs in vial transport medium  Result Value Ref Range Status  SARS Coronavirus 2 by RT PCR NEGATIVE NEGATIVE Final    Comment: (NOTE) SARS-CoV-2 target nucleic acids are NOT DETECTED.  The SARS-CoV-2 RNA is generally detectable in upper respiratory specimens during the acute phase of infection. The lowest concentration of SARS-CoV-2 viral copies this assay can detect is 138 copies/mL. A negative result does not preclude SARS-Cov-2 infection and should not be used as the sole basis for treatment or other patient management decisions. A negative result may occur with  improper specimen collection/handling, submission of specimen other than nasopharyngeal swab, presence of viral mutation(s) within the areas targeted by this assay, and inadequate number of viral copies(<138 copies/mL). A negative result must be combined with clinical observations, patient history, and epidemiological information. The expected result is Negative.  Fact Sheet for Patients:  EntrepreneurPulse.com.au  Fact Sheet for Healthcare Providers:  IncredibleEmployment.be  This test is no t yet approved or cleared by the [place] FDA and  has been authorized for detection and/or diagnosis of SARS-CoV-2 by FDA under an Emergency Use Authorization (EUA). This EUA will remain  in effect (meaning this test can be used) for the duration of the COVID-19  declaration under Section 564(b)(1) of the Act, 21 U.S.C.section 360bbb-3(b)(1), unless the authorization is terminated  or revoked sooner.       Influenza A by PCR NEGATIVE NEGATIVE Final   Influenza B by PCR NEGATIVE NEGATIVE Final    Comment: (NOTE) The Xpert Xpress SARS-CoV-2/FLU/RSV plus assay is intended as an aid in the diagnosis of influenza from Nasopharyngeal swab specimens and should not be used as a sole basis for treatment. Nasal washings and aspirates are unacceptable for Xpert Xpress SARS-CoV-2/FLU/RSV testing.  Fact Sheet for Patients: EntrepreneurPulse.com.au  Fact Sheet for Healthcare Providers: IncredibleEmployment.be  This test is not yet approved or cleared by the [place] FDA and has been authorized for detection and/or diagnosis of SARS-CoV-2 by FDA under an Emergency Use Authorization (EUA). This EUA will remain in effect (meaning this test can be used) for the duration of the COVID-19 declaration under Section 564(b)(1) of the Act, 21 U.S.C. section 360bbb-3(b)(1), unless the authorization is terminated or revoked.  Performed at [facility], [place] [address], [place], [place] [address]   Culture, blood (Routine x 2)     Status: Abnormal   Collection Time: 07/21/21  8:00 PM   Specimen: BLOOD  Result Value Ref Range Status   Specimen Description BLOOD RIGHT ANTECUBITAL  Final   Special Requests   Final    BOTTLES DRAWN AEROBIC AND ANAEROBIC Blood Culture adequate volume   Culture  Setup Time   Final    GRAM POSITIVE COCCI IN CHAINS IN BOTH AEROBIC AND ANAEROBIC BOTTLES    Culture (A)  Final    GROUP B STREP(S.AGALACTIAE)ISOLATED SUSCEPTIBILITIES PERFORMED ON PREVIOUS CULTURE WITHIN THE LAST 5 DAYS. Performed at [facility], [place] [address], [place], [place] [address]    Report Status 07/25/2021 FINAL  Final  Culture, blood (Routine x 2)     Status: Abnormal   Collection Time: 07/21/21  8:03 PM    Specimen: BLOOD  Result Value Ref Range Status   Specimen Description BLOOD LEFT ARM  Final   Special Requests   Final    BOTTLES DRAWN AEROBIC AND ANAEROBIC Blood Culture adequate volume   Culture  Setup Time   Final    GRAM POSITIVE COCCI IN CHAINS IN BOTH AEROBIC AND ANAEROBIC BOTTLES CRITICAL RESULT CALLED TO, READ BACK BY AND VERIFIED  WITH: PHARMD B BOLDEN [id number] AT 830 AM BY CM Performed at [facility], [place] [address], [place], [place] [address]    Culture GROUP B STREP(S.AGALACTIAE)ISOLATED (A)  Final   Report Status 07/25/2021 FINAL  Final   Organism ID, Bacteria GROUP B STREP(S.AGALACTIAE)ISOLATED  Final      Susceptibility   Group b strep(s.agalactiae)isolated - MIC*    CLINDAMYCIN <=0.25 SENSITIVE Sensitive     AMPICILLIN <=0.25 SENSITIVE Sensitive     ERYTHROMYCIN <=0.12 SENSITIVE Sensitive     VANCOMYCIN 0.5 SENSITIVE Sensitive     CEFTRIAXONE <=0.12 SENSITIVE Sensitive     LEVOFLOXACIN 0.5 SENSITIVE Sensitive     * GROUP B STREP(S.AGALACTIAE)ISOLATED  Blood Culture ID Panel (Reflexed)     Status: Abnormal   Collection Time: 07/21/21  8:03 PM  Result Value Ref Range Status   Enterococcus faecalis NOT DETECTED NOT DETECTED Final   Enterococcus Faecium NOT DETECTED NOT DETECTED Final   Listeria monocytogenes NOT DETECTED NOT DETECTED Final   Staphylococcus species NOT DETECTED NOT DETECTED Final   Staphylococcus aureus (BCID) NOT DETECTED NOT DETECTED Final   Staphylococcus epidermidis NOT DETECTED NOT DETECTED Final   Staphylococcus lugdunensis NOT DETECTED NOT DETECTED Final   Streptococcus species DETECTED (A) NOT DETECTED Final    Comment: CRITICAL RESULT CALLED TO, READ BACK BY AND VERIFIED WITH: PHARMD B BOLDEN [id number] AT 923 AM BY CM    Streptococcus agalactiae DETECTED (A) NOT DETECTED Final    Comment: CRITICAL RESULT CALLED TO, READ BACK BY AND VERIFIED WITH: PHARMD B BOLDEN [id number] AT 923 AM BY CM    Streptococcus pneumoniae NOT DETECTED NOT  DETECTED Final   Streptococcus pyogenes NOT DETECTED NOT DETECTED Final   A.calcoaceticus-baumannii NOT DETECTED NOT DETECTED Final   Bacteroides fragilis NOT DETECTED NOT DETECTED Final   Enterobacterales NOT DETECTED NOT DETECTED Final   Enterobacter cloacae complex NOT DETECTED NOT DETECTED Final   Escherichia coli NOT DETECTED NOT DETECTED Final   Klebsiella aerogenes NOT DETECTED NOT DETECTED Final   Klebsiella oxytoca NOT DETECTED NOT DETECTED Final   Klebsiella pneumoniae NOT DETECTED NOT DETECTED Final   Proteus species NOT DETECTED NOT DETECTED Final   Salmonella species NOT DETECTED NOT DETECTED Final   Serratia marcescens NOT DETECTED NOT DETECTED Final   Haemophilus influenzae NOT DETECTED NOT DETECTED Final   Neisseria meningitidis NOT DETECTED NOT DETECTED Final   Pseudomonas aeruginosa NOT DETECTED NOT DETECTED Final   Stenotrophomonas maltophilia NOT DETECTED NOT DETECTED Final   Candida albicans NOT DETECTED NOT DETECTED Final   Candida auris NOT DETECTED NOT DETECTED Final   Candida glabrata NOT DETECTED NOT DETECTED Final   Candida krusei NOT DETECTED NOT DETECTED Final   Candida parapsilosis NOT DETECTED NOT DETECTED Final   Candida tropicalis NOT DETECTED NOT DETECTED Final   Cryptococcus neoformans/gattii NOT DETECTED NOT DETECTED Final    Comment: Performed at [facility], [place]. [address] [place], [place] [address]  Urine Culture     Status: Abnormal   Collection Time: 07/22/21  3:26 AM   Specimen: In/Out Cath Urine  Result Value Ref Range Status   Specimen Description IN/OUT CATH URINE  Final   Special Requests NONE  Final   Culture (A)  Final    30,000 COLONIES/mL STREPTOCOCCUS AGALACTIAE TESTING AGAINST S. AGALACTIAE NOT ROUTINELY PERFORMED DUE TO PREDICTABILITY OF AMP/PEN/VAN SUSCEPTIBILITY. Performed at [facility], [place] [address], [place], [place] [address]    Report Status 07/23/2021 FINAL  Final  MRSA Next Gen by PCR, Nasal     Status:  Abnormal   Collection Time: 07/22/21  5:02 PM   Specimen: Nasal Mucosa; Nasal Swab  Result Value Ref Range Status   MRSA by PCR Next Gen DETECTED (A) NOT DETECTED Final    Comment: RESULT CALLED TO, READ BACK BY AND VERIFIED WITH: ASHLEY ORE RN 07/22/2021 @2051  BY JW (NOTE) The GeneXpert MRSA Assay (FDA approved for NASAL specimens only), is one component of a comprehensive MRSA colonization surveillance program. It is not intended to diagnose MRSA infection nor to guide or monitor treatment for MRSA infections. Test performance is not FDA approved in patients less than 51 years old. Performed at [facility], [place] [address], [place], [place] [address]   Surgical pcr screen     Status: Abnormal   Collection Time: 07/22/21  8:53 PM   Specimen: Nasal Mucosa; Nasal Swab  Result Value Ref Range Status   MRSA, PCR POSITIVE (A) NEGATIVE Final    Comment: RESULT CALLED TO, READ BACK BY AND VERIFIED WITH: A ORR,RN@2230  07/22/21 [place]    Staphylococcus aureus POSITIVE (A) NEGATIVE Final    Comment: (NOTE) The Xpert SA Assay (FDA approved for NASAL specimens in patients 55 years of age and older), is one component of a comprehensive surveillance program. It is not intended to diagnose infection nor to guide or monitor treatment. Performed at [facility], [place] [address], [place], [place] [address]   Culture, blood (routine x 2)     Status: None   Collection Time: 07/24/21 12:02 AM   Specimen: BLOOD  Result Value Ref Range Status   Specimen Description BLOOD RIGHT ARM  Final   Special Requests   Final    BOTTLES DRAWN AEROBIC AND ANAEROBIC Blood Culture adequate volume   Culture   Final    NO GROWTH 5 DAYS Performed at [facility], [address], [place], [place] [address]    Report Status 07/29/2021 FINAL  Final  Culture, blood (routine x 2)     Status: None   Collection Time: 07/24/21 12:02 AM   Specimen: BLOOD  Result Value Ref Range Status   Specimen  Description BLOOD RIGHT ARM  Final   Special Requests   Final    BOTTLES DRAWN AEROBIC AND ANAEROBIC Blood Culture adequate volume   Culture   Final    NO GROWTH 5 DAYS Performed at [facility], [name], [place], [place] [address]    Report Status 07/29/2021 FINAL  Final          Radiology Studies: No results found.      Scheduled Meds:  amiodarone  200 mg Oral BID   [START ON 08/06/2021] amoxicillin  1,000 mg Oral Q8H   apixaban  5 mg Oral BID   vitamin C  1,000 mg Oral Daily   docusate sodium  100 mg Oral Daily   gabapentin  300 mg Oral TID   insulin aspart  0-15 Units Subcutaneous TID WC   insulin aspart  0-5 Units Subcutaneous QHS   insulin aspart  3 Units Subcutaneous TID WC   insulin detemir  18 Units Subcutaneous BID   metoprolol succinate  25 mg Oral Daily   nutrition supplement (JUVEN)  1 packet Oral BID BM   oxyCODONE  10 mg Oral Q12H   polyethylene glycol  17 g Oral Daily   zinc sulfate  220 mg Oral Daily   Continuous  Infusions:  sodium chloride 10 mL/hr at 07/29/21 0816   lactated ringers Stopped (07/23/21 1013)   penicillin g continuous IV infusion 12 Million Units (07/30/21 0857)       Vernell Leep, MD,  FACP, [facility], [facility], [facility] (Care Management Physician Certified) [place]  To contact the attending provider between 7A-7P or the covering provider during after hours 7P-7A, please log into the web site [email or web address] and access using universal [place] password for that web site. If you do not have the password, please call the hospital operator.

## 2021-07-31 DIAGNOSIS — A419 Sepsis, unspecified organism: Secondary | ICD-10-CM | POA: Diagnosis not present

## 2021-07-31 DIAGNOSIS — I5020 Unspecified systolic (congestive) heart failure: Secondary | ICD-10-CM | POA: Diagnosis not present

## 2021-07-31 DIAGNOSIS — Z794 Long term (current) use of insulin: Secondary | ICD-10-CM | POA: Diagnosis not present

## 2021-07-31 DIAGNOSIS — N179 Acute kidney failure, unspecified: Secondary | ICD-10-CM | POA: Diagnosis not present

## 2021-07-31 DIAGNOSIS — R6520 Severe sepsis without septic shock: Secondary | ICD-10-CM | POA: Diagnosis not present

## 2021-07-31 DIAGNOSIS — R7881 Bacteremia: Secondary | ICD-10-CM

## 2021-07-31 DIAGNOSIS — M4644 Discitis, unspecified, thoracic region: Secondary | ICD-10-CM | POA: Diagnosis not present

## 2021-07-31 DIAGNOSIS — I4892 Unspecified atrial flutter: Secondary | ICD-10-CM | POA: Diagnosis not present

## 2021-07-31 DIAGNOSIS — E1142 Type 2 diabetes mellitus with diabetic polyneuropathy: Secondary | ICD-10-CM | POA: Diagnosis not present

## 2021-07-31 LAB — BASIC METABOLIC PANEL
Anion gap: 8 (ref 5–15)
BUN: 28 mg/dL — ABNORMAL HIGH (ref 6–20)
CO2: 29 mmol/L (ref 22–32)
Calcium: 8 mg/dL — ABNORMAL LOW (ref 8.9–10.3)
Chloride: 91 mmol/L — ABNORMAL LOW (ref 98–111)
Creatinine, Ser: 1.4 mg/dL — ABNORMAL HIGH (ref 0.61–1.24)
GFR, Estimated: 59 mL/min — ABNORMAL LOW (ref >60)
Glucose, Bld: 282 mg/dL — ABNORMAL HIGH (ref 70–99)
Potassium: 4.3 mmol/L (ref 3.5–5.1)
Sodium: 128 mmol/L — ABNORMAL LOW (ref 135–145)

## 2021-07-31 LAB — CBC
HCT: 41.4 % (ref 39.0–52.0)
Hemoglobin: 14 g/dL (ref 13.0–17.0)
MCH: 28.7 pg (ref 26.0–34.0)
MCHC: 33.8 g/dL (ref 30.0–36.0)
MCV: 84.8 fL (ref 80.0–100.0)
Platelets: 318 10*3/uL (ref 150–400)
RBC: 4.88 MIL/uL (ref 4.22–5.81)
RDW: 15.5 % (ref 11.5–15.5)
WBC: 14.8 10*3/uL — ABNORMAL HIGH (ref 4.0–10.5)
nRBC: 0 % (ref 0.0–0.2)

## 2021-07-31 LAB — GLUCOSE, CAPILLARY
Glucose-Capillary: 306 mg/dL — ABNORMAL HIGH (ref 70–99)
Glucose-Capillary: 324 mg/dL — ABNORMAL HIGH (ref 70–99)
Glucose-Capillary: 279 mg/dL — ABNORMAL HIGH (ref 70–99)
Glucose-Capillary: 197 mg/dL — ABNORMAL HIGH (ref 70–99)

## 2021-07-31 LAB — MAGNESIUM: Magnesium: 1.6 mg/dL — ABNORMAL LOW (ref 1.7–2.4)

## 2021-07-31 MED ORDER — METOPROLOL SUCCINATE ER 25 MG PO TB24
50.0000 mg | ORAL_TABLET | Freq: Every day | ORAL | Status: DC
  Administered 2021-08-01 – 2021-09-01 (×32): 50 mg via ORAL
  Filled 2021-07-31 (×32): qty 1

## 2021-07-31 MED ORDER — MAGNESIUM SULFATE 4 GM/100ML IV SOLN
4.0000 g | Freq: Once | INTRAVENOUS | Status: AC
  Administered 2021-07-31: 4 g via INTRAVENOUS
  Filled 2021-07-31: qty 100

## 2021-07-31 MED ORDER — TRAZODONE HCL 50 MG PO TABS
50.0000 mg | ORAL_TABLET | Freq: Every evening | ORAL | Status: DC | PRN
  Administered 2021-07-31 – 2021-08-16 (×17): 50 mg via ORAL
  Filled 2021-07-31 (×18): qty 1

## 2021-07-31 MED ORDER — HYDROMORPHONE HCL 1 MG/ML IJ SOLN
1.0000 mg | Freq: Four times a day (QID) | INTRAMUSCULAR | Status: DC | PRN
  Administered 2021-07-31 – 2021-08-01 (×4): 1 mg via INTRAVENOUS
  Filled 2021-07-31 (×4): qty 1

## 2021-07-31 MED ORDER — METOLAZONE 5 MG PO TABS
2.5000 mg | ORAL_TABLET | Freq: Once | ORAL | Status: AC
  Administered 2021-07-31: 2.5 mg via ORAL
  Filled 2021-07-31: qty 1

## 2021-07-31 MED ORDER — GLUCERNA SHAKE PO LIQD
237.0000 mL | Freq: Three times a day (TID) | ORAL | Status: DC
  Administered 2021-07-31 – 2021-08-05 (×10): 237 mL via ORAL

## 2021-07-31 MED ORDER — INSULIN DETEMIR 100 UNIT/ML ~~LOC~~ SOLN
20.0000 [IU] | Freq: Two times a day (BID) | SUBCUTANEOUS | Status: DC
  Administered 2021-07-31 (×2): 20 [IU] via SUBCUTANEOUS
  Filled 2021-07-31 (×3): qty 0.2

## 2021-07-31 MED ORDER — FUROSEMIDE 10 MG/ML IJ SOLN
80.0000 mg | Freq: Two times a day (BID) | INTRAMUSCULAR | Status: DC
  Administered 2021-07-31 – 2021-08-01 (×3): 80 mg via INTRAVENOUS
  Filled 2021-07-31 (×4): qty 8

## 2021-07-31 NOTE — Plan of Care (Signed)

## 2021-07-31 NOTE — Progress Notes (Signed)
[name], male   DOB: Mar 03, 1966, 55 y.o.   MRN: [id number]  PROGRESS NOTE    [name]  [name] DOB: March 28, 1966 DOA: 07/21/2021 PCP: Patient, No Pcp Per (Inactive)   Brief Narrative:  55 year old male with history of diabetes mellitus type 2, diabetic foot infection with prior right BKA, chronic diastolic CHF, hypertension, dyslipidemia presented with worsening left foot swelling/discoloration and discharge.  On presentation, he was hypotensive, tachycardic with A. fib with RVR with CT suggestive of first MTP osteomyelitis and soft tissue gas concerning for possible necrotizing fasciitis along with AKI with creatinine of 2.7 and mild DKA.  His blood pressures did not improve with IV fluids and he was transferred to ICU and PCCM was consulted.  Orthopedics was consulted.  He underwent left transtibial amputation on 07/23/2021.  He was found to have strep agalactiae bacteremia.  ID was consulted.  He was found to have vertebral discitis versus osteomyelitis on MRI.  He was also found to have new systolic dysfunction with LVEF of 35% along with new onset atrial flutter requiring IV amiodarone which has been switched to oral.  Cardiology was consulted.  He has been started on Eliquis.  S/p TEE followed by DCCV on 11/22.  Assessment & Plan:   Septic shock: Evolving on admission from bacteremia/diabetic foot infection -Off pressors.  Shock resolved. -Required care in ICU under PCCM service: Care has been transferred to [facility] from 07/27/2021 onwards  Diabetic foot infection, left -Status post left transtibial amputation on 07/23/2021.  Wound/wound VAC care as per Dr. Sharol Given.  Group B streptococcus bacteremia, Possible thoracic discitis/osteomyelitis -TTE showed no vegetation.   -MRI of cervical/thoracic/lumbar spine showed possible thoracic osteomyelitis/discitis without evidence of abscess but MRI was without contrast.   - Neurosurgery evaluation appreciated: Not convinced  that patient has epidural abscess.  Recommended to manage conservatively with IV antibiotics. -Antibiotics have been changed to penicillin.  Patient will probably need 8 weeks of antibiotics per ID (2 weeks of parenteral therapy with penicillin from 07/23/2021-08/05/2021; then transition to amoxicillin 1 g 3 times daily till 09/14/2021). -Repeat blood cultures from 07/24/2021 have been negative so far -TEE 11/22 without evidence of vegetation.  AKI on CKD stage IV, Acute metabolic acidosis -Presented with creatinine of 2.76.  Creatinine peaked to 3.29. -Creatinine gradually improving, plateaued in the 1.4 range for the last 2 days.  Continue to trend daily BMPs while on IV Lasix. -Acidosis resolved. -Avoid nephrotoxins.  Hyponatremia -Corrected serum sodium to hyperglycemia is 131.  New systolic dysfunction in a patient with chronic diastolic heart failure -Echo showed EF of 35% (previous echo showed EF of 50 to 55% in 04/2021) -Strict input output.  Daily weights.  Fluid restriction.  Continue conservative management.  Continue metoprolol succinate. -Cardiology following. -Weight and intake output documentation probably not fully accurate -As discussed with Cardiology, the cardiomyopathy may be tachycardia mediated in the context of new onset atrial flutter with RVR or possibly stress-induced cardiomyopathy from bacteremia and septic shock.   -Cardiology continue to adjust meds.  Increased IV Lasix to 80 mg twice daily, continuing metolazone 2.5 Mg daily, increasing Toprol-XL from 20-50 Mg daily. - Put out 3.1 L urine output yesterday and 1.1 L thus far today.  New onset atrial flutter -Continue Toprol-XL. -IV amiodarone now switched to 200 Mg daily -Continue Eliquis 5 Mg twice daily -S/p TEE guided DCCV on 11/22 and currently in sinus rhythm. -Follow-up 2D echo in a couple of months. -Cardiology following.  Hypomagnesemia -Persistent.  Replace aggressively and follow in  AM.  Leukocytosis -Still significant but improving.  Monitor.  Appears to have plateaued off.  Diabetes mellitus type 2 with hyperglycemia -Continue Lantus along with CBGs with SSI -Appears to be widely fluctuating.  Increased mealtime NovoLog to 5 units.  Will likely need further titration of insulins. - Patient reportedly snacking on graham crackers, counseled regarding dietary restrictions for diabetes.  Increased Levemir from 18 to 20 units twice daily.  Thrombocytopenia -Resolved  Generalized deconditioning -PT recommends CIR.  A consult to CIR. as per TOC, patient's insurance declined CIR admission and plan to do peer to peer with Psychologist, occupational when arranged by TOC.  Acute on chronic pain -Follows with Dr. Herma Mering, pain management. -Reviewed [place] PDMP and patient regularly fills opioid prescriptions.  Likely opioid dependent. - Obviously has reasons for acute brain from recent surgery, discitis/osteomyelitis. -Initiated OxyContin 10 Mg every 12 hours on 11/23. - Reports pain control is improved.  Also reports that he takes Dilaudid in the evenings/nights because it makes him tired so he can go to sleep.  Counseled him that this medication is not a sleeping aid.  Counseled that we will start to back off on IV opioids.  Insomnia - Consider trazodone.  DVT prophylaxis: Apixaban Code Status: Full Family Communication: None at bedside Disposition Plan: Status is: Inpatient  Remains inpatient appropriate because: IV Lasix and further diuresis for decompensated CHF.  Consultants: Cardiology/orthopedic/ID/neurosurgery/PCCM/neurosurgery  Procedures: Left transtibial amputation on 07/23/2021.  TEE and DCCV 11/22.  Antimicrobials:  Anti-infectives (From admission, onward)    Start     Dose/Rate Route Frequency Ordered Stop   08/06/21 0600  amoxicillin (AMOXIL) capsule 1,000 mg        1,000 mg Oral Every 8 hours 07/29/21 1001 09/17/21 0559   07/28/21 1000  penicillin G  potassium 12 Million Units in dextrose 5 % 500 mL continuous infusion        12 Million Units 41.7 mL/hr over 12 Hours Intravenous Every 12 hours 07/28/21 0846 08/05/21 2359   07/25/21 1000  penicillin G potassium 8 Million Units in dextrose 5 % 500 mL continuous infusion  Status:  Discontinued        8 Million Units 41.7 mL/hr over 12 Hours Intravenous Every 12 hours 07/25/21 0854 07/28/21 0846   07/23/21 1345  cefTRIAXone (ROCEPHIN) 2 g in sodium chloride 0.9 % 100 mL IVPB  Status:  Discontinued        2 g 200 mL/hr over 30 Minutes Intravenous Every 24 hours 07/23/21 1246 07/25/21 0854   07/23/21 0815  ceFAZolin (ANCEF) IVPB 2g/100 mL premix  Status:  Discontinued        2 g 200 mL/hr over 30 Minutes Intravenous To ShortStay Surgical 07/23/21 0622 07/23/21 0836   07/22/21 2000  linezolid (ZYVOX) IVPB 600 mg  Status:  Discontinued        600 mg 300 mL/hr over 60 Minutes Intravenous Every 12 hours 07/22/21 1544 07/23/21 1349   07/22/21 0800  piperacillin-tazobactam (ZOSYN) IVPB 3.375 g  Status:  Discontinued        3.375 g 12.5 mL/hr over 240 Minutes Intravenous Every 8 hours 07/22/21 0107 07/23/21 1246   07/22/21 0130  clindamycin (CLEOCIN) IVPB 600 mg  Status:  Discontinued        600 mg 100 mL/hr over 30 Minutes Intravenous Every 8 hours 07/22/21 0101 07/22/21 1544   07/22/21 0115  piperacillin-tazobactam (ZOSYN) IVPB 3.375 g  3.375 g 100 mL/hr over 30 Minutes Intravenous STAT 07/22/21 0107 07/22/21 0223   07/21/21 2335  vancomycin variable dose per unstable renal function (pharmacist dosing)  Status:  Discontinued         Does not apply See admin instructions 07/21/21 2335 07/22/21 1544   07/21/21 2000  vancomycin (VANCOREADY) IVPB 2000 mg/400 mL        2,000 mg 200 mL/hr over 120 Minutes Intravenous  Once 07/21/21 1955 07/22/21 0004   07/21/21 2000  cefTRIAXone (ROCEPHIN) 2 g in sodium chloride 0.9 % 100 mL IVPB        2 g 200 mL/hr over 30 Minutes Intravenous  Once 07/21/21  1955 07/21/21 2130        Subjective: Ongoing mid back pain but did not appear in any painful distress.  Rest of history as noted above.  Wants to talk to social worker regarding home DME's.  Aware that his insurance has denied CIR and advised him that I plan to do a peer to peer with the Psychologist, occupational when able.  Objective: Vitals:   07/31/21 0046 07/31/21 0445 07/31/21 0849 07/31/21 1346  BP: (!) 121/101 (!) 149/93 (!) 151/86   Pulse:   77 74  Resp:  18  19  Temp:    98.7 F (37.1 C)  TempSrc:    Oral  SpO2:      Weight:      Height:        Intake/Output Summary (Last 24 hours) at 07/31/2021 1536 Last data filed at 07/31/2021 1231 Gross per 24 hour  Intake 303.31 ml  Output 3600 ml  Net -3296.69 ml   Filed Weights   07/23/21 0827 07/27/21 0252 07/29/21 0754  Weight: 108.9 kg 108.2 kg 108.2 kg    Examination:  General exam: Young male, moderately built and overweight lying propped up in bed.  Although reports ongoing pain, did not look in painful distress, not tachycardic or tachypneic. Respiratory system: Clear to auscultation.  No increased work of breathing. cardiovascular system: S1 and S2 heard, RRR.  No JVD, murmurs.  Does have bilateral thigh and scrotal edema.  Healed right BKA stump-has stump shrinker.  Left transtibial amputation site with wound VAC in place and on top-has a stump shrinker gastrointestinal system: Abdomen is mildly distended, soft and nontender.  Normal bowel sounds heard  extremities: Left BKA with amputation brace present; right lower extremity BKA present Central nervous system: Alert and oriented.  No focal neurological deficits noted  skin: No obvious ecchymosis/lesions  psychiatry: Anxious.  Data Reviewed: I have personally reviewed following labs and imaging studies  CBC: Recent Labs  Lab 07/27/21 0126 07/28/21 0055 07/29/21 0210 07/30/21 0144 07/31/21 0130  WBC 16.0* 17.1* 15.4* 15.0* 14.8*  HGB 15.1 14.1 14.0  13.6 14.0  HCT 45.4 42.2 43.1 40.2 41.4  MCV 84.2 83.2 85.9 84.8 84.8  PLT 140* 223 280 297 517   Basic Metabolic Panel: Recent Labs  Lab 07/27/21 0126 07/28/21 0055 07/29/21 0210 07/30/21 0144 07/31/21 0130  NA 130* 132* 132* 128* 128*  K 3.7 4.3 4.6 4.7 4.3  CL 100 101 100 96* 91*  CO2 22 24 24 25 29   GLUCOSE 255* 252* 268* 359* 282*  BUN 78* 59* 44* 34* 28*  CREATININE 2.25* 1.75* 1.58* 1.43* 1.40*  CALCIUM 7.9* 8.0* 8.2* 7.9* 8.0*  MG 2.2 1.9 1.6* 1.6* 1.6*   GFR: Estimated Creatinine Clearance: 79.3 mL/min (A) (by C-G formula based on SCr of 1.4  mg/dL (H)). Liver Function Tests: No results for input(s): AST, ALT, ALKPHOS, BILITOT, PROT, ALBUMIN in the last 168 hours.   Coagulation Profile: Recent Labs  Lab 07/28/21 1557  INR 1.6*      CBG: Recent Labs  Lab 07/30/21 1222 07/30/21 1657 07/30/21 2158 07/31/21 0744 07/31/21 1116  GLUCAP 319* 156* 270* 306* 324*    Recent Results (from the past 240 hour(s))  Resp Panel by RT-PCR (Flu A&B, Covid) Nasopharyngeal Swab     Status: None   Collection Time: 07/21/21  7:54 PM   Specimen: Nasopharyngeal Swab; Nasopharyngeal(NP) swabs in vial transport medium  Result Value Ref Range Status   SARS Coronavirus 2 by RT PCR NEGATIVE NEGATIVE Final    Comment: (NOTE) SARS-CoV-2 target nucleic acids are NOT DETECTED.  The SARS-CoV-2 RNA is generally detectable in upper respiratory specimens during the acute phase of infection. The lowest concentration of SARS-CoV-2 viral copies this assay can detect is 138 copies/mL. A negative result does not preclude SARS-Cov-2 infection and should not be used as the sole basis for treatment or other patient management decisions. A negative result may occur with  improper specimen collection/handling, submission of specimen other than nasopharyngeal swab, presence of viral mutation(s) within the areas targeted by this assay, and inadequate number of viral copies(<138 copies/mL). A  negative result must be combined with clinical observations, patient history, and epidemiological information. The expected result is Negative.  Fact Sheet for Patients:  EntrepreneurPulse.com.au  Fact Sheet for Healthcare Providers:  IncredibleEmployment.be  This test is no t yet approved or cleared by the [place] FDA and  has been authorized for detection and/or diagnosis of SARS-CoV-2 by FDA under an Emergency Use Authorization (EUA). This EUA will remain  in effect (meaning this test can be used) for the duration of the COVID-19 declaration under Section 564(b)(1) of the Act, 21 U.S.C.section 360bbb-3(b)(1), unless the authorization is terminated  or revoked sooner.       Influenza A by PCR NEGATIVE NEGATIVE Final   Influenza B by PCR NEGATIVE NEGATIVE Final    Comment: (NOTE) The Xpert Xpress SARS-CoV-2/FLU/RSV plus assay is intended as an aid in the diagnosis of influenza from Nasopharyngeal swab specimens and should not be used as a sole basis for treatment. Nasal washings and aspirates are unacceptable for Xpert Xpress SARS-CoV-2/FLU/RSV testing.  Fact Sheet for Patients: EntrepreneurPulse.com.au  Fact Sheet for Healthcare Providers: IncredibleEmployment.be  This test is not yet approved or cleared by the [place] FDA and has been authorized for detection and/or diagnosis of SARS-CoV-2 by FDA under an Emergency Use Authorization (EUA). This EUA will remain in effect (meaning this test can be used) for the duration of the COVID-19 declaration under Section 564(b)(1) of the Act, 21 U.S.C. section 360bbb-3(b)(1), unless the authorization is terminated or revoked.  Performed at [facility], [place] [address] [place], [place] [address]   Culture, blood (Routine x 2)     Status: Abnormal   Collection Time: 07/21/21  8:00 PM   Specimen: BLOOD  Result Value Ref Range Status    Specimen Description BLOOD RIGHT ANTECUBITAL  Final   Special Requests   Final    BOTTLES DRAWN AEROBIC AND ANAEROBIC Blood Culture adequate volume   Culture  Setup Time   Final    GRAM POSITIVE COCCI IN CHAINS IN BOTH AEROBIC AND ANAEROBIC BOTTLES    Culture (A)  Final    GROUP B STREP(S.AGALACTIAE)ISOLATED SUSCEPTIBILITIES PERFORMED ON PREVIOUS CULTURE WITHIN THE LAST 5  DAYS. Performed at [facility], [place] [address], [place], [place] [address]    Report Status 07/25/2021 FINAL  Final  Culture, blood (Routine x 2)     Status: Abnormal   Collection Time: 07/21/21  8:03 PM   Specimen: BLOOD  Result Value Ref Range Status   Specimen Description BLOOD LEFT ARM  Final   Special Requests   Final    BOTTLES DRAWN AEROBIC AND ANAEROBIC Blood Culture adequate volume   Culture  Setup Time   Final    GRAM POSITIVE COCCI IN CHAINS IN BOTH AEROBIC AND ANAEROBIC BOTTLES CRITICAL RESULT CALLED TO, READ BACK BY AND VERIFIED WITH: PHARMD B BOLDEN [id number] AT 830 AM BY CM Performed at [facility], [place] [address] [place], [place] [address]    Culture GROUP B STREP(S.AGALACTIAE)ISOLATED (A)  Final   Report Status 07/25/2021 FINAL  Final   Organism ID, Bacteria GROUP B STREP(S.AGALACTIAE)ISOLATED  Final      Susceptibility   Group b strep(s.agalactiae)isolated - MIC*    CLINDAMYCIN <=0.25 SENSITIVE Sensitive     AMPICILLIN <=0.25 SENSITIVE Sensitive     ERYTHROMYCIN <=0.12 SENSITIVE Sensitive     VANCOMYCIN 0.5 SENSITIVE Sensitive     CEFTRIAXONE <=0.12 SENSITIVE Sensitive     LEVOFLOXACIN 0.5 SENSITIVE Sensitive     * GROUP B STREP(S.AGALACTIAE)ISOLATED  Blood Culture ID Panel (Reflexed)     Status: Abnormal   Collection Time: 07/21/21  8:03 PM  Result Value Ref Range Status   Enterococcus faecalis NOT DETECTED NOT DETECTED Final   Enterococcus Faecium NOT DETECTED NOT DETECTED Final   Listeria monocytogenes NOT DETECTED NOT DETECTED Final   Staphylococcus species NOT DETECTED  NOT DETECTED Final   Staphylococcus aureus (BCID) NOT DETECTED NOT DETECTED Final   Staphylococcus epidermidis NOT DETECTED NOT DETECTED Final   Staphylococcus lugdunensis NOT DETECTED NOT DETECTED Final   Streptococcus species DETECTED (A) NOT DETECTED Final    Comment: CRITICAL RESULT CALLED TO, READ BACK BY AND VERIFIED WITH: PHARMD B BOLDEN [id number] AT 923 AM BY CM    Streptococcus agalactiae DETECTED (A) NOT DETECTED Final    Comment: CRITICAL RESULT CALLED TO, READ BACK BY AND VERIFIED WITH: PHARMD B BOLDEN [id number] AT 923 AM BY CM    Streptococcus pneumoniae NOT DETECTED NOT DETECTED Final   Streptococcus pyogenes NOT DETECTED NOT DETECTED Final   A.calcoaceticus-baumannii NOT DETECTED NOT DETECTED Final   Bacteroides fragilis NOT DETECTED NOT DETECTED Final   Enterobacterales NOT DETECTED NOT DETECTED Final   Enterobacter cloacae complex NOT DETECTED NOT DETECTED Final   Escherichia coli NOT DETECTED NOT DETECTED Final   Klebsiella aerogenes NOT DETECTED NOT DETECTED Final   Klebsiella oxytoca NOT DETECTED NOT DETECTED Final   Klebsiella pneumoniae NOT DETECTED NOT DETECTED Final   Proteus species NOT DETECTED NOT DETECTED Final   Salmonella species NOT DETECTED NOT DETECTED Final   Serratia marcescens NOT DETECTED NOT DETECTED Final   Haemophilus influenzae NOT DETECTED NOT DETECTED Final   Neisseria meningitidis NOT DETECTED NOT DETECTED Final   Pseudomonas aeruginosa NOT DETECTED NOT DETECTED Final   Stenotrophomonas maltophilia NOT DETECTED NOT DETECTED Final   Candida albicans NOT DETECTED NOT DETECTED Final   Candida auris NOT DETECTED NOT DETECTED Final   Candida glabrata NOT DETECTED NOT DETECTED Final   Candida krusei NOT DETECTED NOT DETECTED Final   Candida parapsilosis NOT DETECTED NOT DETECTED Final   Candida tropicalis NOT DETECTED NOT DETECTED Final   Cryptococcus neoformans/gattii NOT  DETECTED NOT DETECTED Final    Comment: Performed at [facility],  [place] [address], [place], [place] [address]  Urine Culture     Status: Abnormal   Collection Time: 07/22/21  3:26 AM   Specimen: In/Out Cath Urine  Result Value Ref Range Status   Specimen Description IN/OUT CATH URINE  Final   Special Requests NONE  Final   Culture (A)  Final    30,000 COLONIES/mL STREPTOCOCCUS AGALACTIAE TESTING AGAINST S. AGALACTIAE NOT ROUTINELY PERFORMED DUE TO PREDICTABILITY OF AMP/PEN/VAN SUSCEPTIBILITY. Performed at [facility], [place] [address], [place], [place] [address]    Report Status 07/23/2021 FINAL  Final  MRSA Next Gen by PCR, Nasal     Status: Abnormal   Collection Time: 07/22/21  5:02 PM   Specimen: Nasal Mucosa; Nasal Swab  Result Value Ref Range Status   MRSA by PCR Next Gen DETECTED (A) NOT DETECTED Final    Comment: RESULT CALLED TO, READ BACK BY AND VERIFIED WITH: ASHLEY ORE RN 07/22/2021 @2051  BY JW (NOTE) The GeneXpert MRSA Assay (FDA approved for NASAL specimens only), is one component of a comprehensive MRSA colonization surveillance program. It is not intended to diagnose MRSA infection nor to guide or monitor treatment for MRSA infections. Test performance is not FDA approved in patients less than 77 years old. Performed at [facility], [place] [address], [place], [place] [address]   Surgical pcr screen     Status: Abnormal   Collection Time: 07/22/21  8:53 PM   Specimen: Nasal Mucosa; Nasal Swab  Result Value Ref Range Status   MRSA, PCR POSITIVE (A) NEGATIVE Final    Comment: RESULT CALLED TO, READ BACK BY AND VERIFIED WITH: A ORR,RN@2230  07/22/21 [place]    Staphylococcus aureus POSITIVE (A) NEGATIVE Final    Comment: (NOTE) The Xpert SA Assay (FDA approved for NASAL specimens in patients 78 years of age and older), is one component of a comprehensive surveillance program. It is not intended to diagnose infection nor to guide or monitor treatment. Performed at [facility], [place] [address] [place], [place] [address]    Culture, blood (routine x 2)     Status: None   Collection Time: 07/24/21 12:02 AM   Specimen: BLOOD  Result Value Ref Range Status   Specimen Description BLOOD RIGHT ARM  Final   Special Requests   Final    BOTTLES DRAWN AEROBIC AND ANAEROBIC Blood Culture adequate volume   Culture   Final    NO GROWTH 5 DAYS Performed at [facility], [address], [place], [place] [address]    Report Status 07/29/2021 FINAL  Final  Culture, blood (routine x 2)     Status: None   Collection Time: 07/24/21 12:02 AM   Specimen: BLOOD  Result Value Ref Range Status   Specimen Description BLOOD RIGHT ARM  Final   Special Requests   Final    BOTTLES DRAWN AEROBIC AND ANAEROBIC Blood Culture adequate volume   Culture   Final    NO GROWTH 5 DAYS Performed at [facility], [address], [place], [place] [address]    Report Status 07/29/2021 FINAL  Final          Radiology Studies: No results found.      Scheduled Meds:  amiodarone  200 mg Oral BID   [START ON 08/06/2021] amoxicillin  1,000 mg Oral Q8H   apixaban  5 mg Oral BID  vitamin C  1,000 mg Oral Daily   docusate sodium  100 mg Oral Daily   feeding supplement (GLUCERNA SHAKE)  237 mL Oral TID BM   furosemide  80 mg Intravenous BID   gabapentin  300 mg Oral TID   insulin aspart  0-15 Units Subcutaneous TID WC   insulin aspart  0-5 Units Subcutaneous QHS   insulin aspart  5 Units Subcutaneous TID WC   insulin detemir  20 Units Subcutaneous BID   [START ON 08/01/2021] metoprolol succinate  50 mg Oral Daily   oxyCODONE  10 mg Oral Q12H   polyethylene glycol  17 g Oral Daily   zinc sulfate  220 mg Oral Daily   Continuous Infusions:  sodium chloride 10 mL/hr at 07/29/21 0816   lactated ringers Stopped (07/23/21 1013)   penicillin g continuous IV infusion 12 Million Units (07/31/21 1010)       Vernell Leep, MD,  FACP, [facility], [name], [name] (Care Management Physician Certified) [place]  To contact the attending provider between 7A-7P or the covering provider during after hours 7P-7A, please log into the web site [email or web address] and access using universal [place] password for that web site. If you do not have the password, please call the hospital operator.

## 2021-07-31 NOTE — Progress Notes (Addendum)
Progress Note  Patient Name: [name] Date of Encounter: 07/31/2021  [facility] Cardiologist: Sanda Klein, MD   Subjective    He reports pain. He states his testicles are enlarged  Crt 1.43 -> 1.4   Net negative 1.4 L   Inpatient Medications    Scheduled Meds:  amiodarone  200 mg Oral BID   [START ON 08/06/2021] amoxicillin  1,000 mg Oral Q8H   apixaban  5 mg Oral BID   vitamin C  1,000 mg Oral Daily   docusate sodium  100 mg Oral Daily   gabapentin  300 mg Oral TID   insulin aspart  0-15 Units Subcutaneous TID WC   insulin aspart  0-5 Units Subcutaneous QHS   insulin aspart  5 Units Subcutaneous TID WC   insulin detemir  20 Units Subcutaneous BID   metoprolol succinate  25 mg Oral Daily   nutrition supplement (JUVEN)  1 packet Oral BID BM   oxyCODONE  10 mg Oral Q12H   polyethylene glycol  17 g Oral Daily   zinc sulfate  220 mg Oral Daily   Continuous Infusions:  sodium chloride 10 mL/hr at 07/29/21 0816   lactated ringers Stopped (07/23/21 1013)   magnesium sulfate bolus IVPB 4 g (07/31/21 0634)   penicillin g continuous IV infusion 12 Million Units (07/30/21 2155)   PRN Meds: acetaminophen, alum & mag hydroxide-simeth, bisacodyl, dextrose, diphenhydrAMINE, guaiFENesin-dextromethorphan, hydrALAZINE, HYDROmorphone (DILAUDID) injection, labetalol, ondansetron, oxyCODONE, phenol, polyethylene glycol   Vital Signs    Vitals:   07/30/21 1345 07/30/21 2034 07/31/21 0046 07/31/21 0445  BP: 111/78 125/70 (!) 121/101 (!) 149/93  Pulse: 77 78    Resp: 19 19  18   Temp: 98.2 F (36.8 C) 97.7 F (36.5 C)    TempSrc: Oral Oral    SpO2: 96% 94%    Weight:      Height:        Intake/Output Summary (Last 24 hours) at 07/31/2021 0807 Last data filed at 07/31/2021 0749 Gross per 24 hour  Intake 569.8 ml  Output 3550 ml  Net -2980.2 ml   Positive 3 L from admit  Last 3 Weights 07/29/2021 07/27/2021 07/23/2021  Weight (lbs) 238 lb 8.6 oz 238 lb 8.6  oz 240 lb  Weight (kg) 108.2 kg 108.2 kg 108.863 kg      Telemetry    Sinus rhythm -Reviewed   Physical Exam   Physical Exam Gen: uncomfortable, diaphoretic, plethoric.  Neuro: alert and oriented CV: r,r,r no murmurs.  No jugular venous distension at 45 degrees Vasc: 2+ radial pulses Pulm: CLAB Abd: non distended GU: testicular enlarged Ext: no pitting, some fluid, BL amputation Skin: warm and well perfused Psych: normal mood   Labs    High Sensitivity Troponin:   Recent Labs  Lab 07/22/21 0246 07/22/21 0645  TROPONINIHS 52* 44*     Chemistry Recent Labs  Lab 07/29/21 0210 07/30/21 0144 07/31/21 0130  NA 132* 128* 128*  K 4.6 4.7 4.3  CL 100 96* 91*  CO2 24 25 29   GLUCOSE 268* 359* 282*  BUN 44* 34* 28*  CREATININE 1.58* 1.43* 1.40*  CALCIUM 8.2* 7.9* 8.0*  MG 1.6* 1.6* 1.6*  GFRNONAA 51* 58* 59*  ANIONGAP 8 7 8     Lipids No results for input(s): CHOL, TRIG, HDL, LABVLDL, LDLCALC, CHOLHDL in the last 168 hours.  Hematology Recent Labs  Lab 07/29/21 0210 07/30/21 0144 07/31/21 0130  WBC 15.4* 15.0* 14.8*  RBC 5.02 4.74 4.88  HGB 14.0 13.6 14.0  HCT 43.1 40.2 41.4  MCV 85.9 84.8 84.8  MCH 27.9 28.7 28.7  MCHC 32.5 33.8 33.8  RDW 15.8* 15.6* 15.5  PLT 280 297 318   Thyroid No results for input(s): TSH, FREET4 in the last 168 hours.  BNP Recent Labs  Lab 07/27/21 0126  BNP 1,368.1*    DDimer No results for input(s): DDIMER in the last 168 hours.   Radiology    No results found.  Cardiac Studies     Patient Profile     55 y.o. male with a hx of HFpEF (EF = 50%), HTN, DM2, CKD 3, osteomyelitis s/p R BKA who is being seen 07/22/2021 for the evaluation new diagnosis of systolic dysfunction  Assessment & Plan    Systolic dysfunction (new diagnosis this admission): C/f stress induced/tachymediated CM as he presented with septic shock. - 07/2021 echo LVEF 11%, indet diastolic fxn, mod RV dysfunction, PASP is 53 - 04/2021 echo 50-55% -  goal to uptitrate GDMT, increasing metoprolol dose today. - if renal fxn continues to improve can add low dose lisinopril    2. New onset Aflutter - presented with RVR in the setting of sepsis -Cardioverted 11/22     - continues to be in sinus rhythm - started on amio this admission, IV has been transitioned to oral 200 daily - started on eliquis 5 mg BID   3. Sepsis/bacteremia strep Agalactiae Bacteremia due to Diabetic Foot Infection  Initially septic shock, now off pressors TEE negaative for endocarditis      4. AKI on CKD/Cardiorenal-  Cr continues to improve on diuresis  Today's Recommendations: - still has some volume  - crt improving - can increase lasix to 80 IV BID, continue metolazone 2.5 mg. Goal net negative 2L - K>4, Mg>2 - in sinus rhythm - increased his metoprolol XL from 25 to 50 mg  - continue amiodarone 200 mg BID oral load - continue eliquis   For questions or updates, please contact [facility] Please consult [email or web address] for contact info under        Signed, Janina Mayo, MD  07/31/2021, 8:07 AM

## 2021-08-01 ENCOUNTER — Encounter (HOSPITAL_COMMUNITY): Payer: Self-pay | Admitting: Cardiology

## 2021-08-01 DIAGNOSIS — Z794 Long term (current) use of insulin: Secondary | ICD-10-CM | POA: Diagnosis not present

## 2021-08-01 DIAGNOSIS — A419 Sepsis, unspecified organism: Secondary | ICD-10-CM | POA: Diagnosis not present

## 2021-08-01 DIAGNOSIS — N179 Acute kidney failure, unspecified: Secondary | ICD-10-CM | POA: Diagnosis not present

## 2021-08-01 DIAGNOSIS — E1142 Type 2 diabetes mellitus with diabetic polyneuropathy: Secondary | ICD-10-CM | POA: Diagnosis not present

## 2021-08-01 DIAGNOSIS — R6520 Severe sepsis without septic shock: Secondary | ICD-10-CM | POA: Diagnosis not present

## 2021-08-01 DIAGNOSIS — M4644 Discitis, unspecified, thoracic region: Secondary | ICD-10-CM | POA: Diagnosis not present

## 2021-08-01 LAB — GLUCOSE, CAPILLARY
Glucose-Capillary: 331 mg/dL — ABNORMAL HIGH (ref 70–99)
Glucose-Capillary: 160 mg/dL — ABNORMAL HIGH (ref 70–99)
Glucose-Capillary: 203 mg/dL — ABNORMAL HIGH (ref 70–99)
Glucose-Capillary: 190 mg/dL — ABNORMAL HIGH (ref 70–99)

## 2021-08-01 LAB — CBC
HCT: 41.5 % (ref 39.0–52.0)
Hemoglobin: 13.5 g/dL (ref 13.0–17.0)
MCH: 28 pg (ref 26.0–34.0)
MCHC: 32.5 g/dL (ref 30.0–36.0)
MCV: 86.1 fL (ref 80.0–100.0)
Platelets: 349 10*3/uL (ref 150–400)
RBC: 4.82 MIL/uL (ref 4.22–5.81)
RDW: 15.2 % (ref 11.5–15.5)
WBC: 13.9 10*3/uL — ABNORMAL HIGH (ref 4.0–10.5)
nRBC: 0 % (ref 0.0–0.2)

## 2021-08-01 LAB — BASIC METABOLIC PANEL
Anion gap: 7 (ref 5–15)
BUN: 29 mg/dL — ABNORMAL HIGH (ref 6–20)
CO2: 33 mmol/L — ABNORMAL HIGH (ref 22–32)
Calcium: 8.1 mg/dL — ABNORMAL LOW (ref 8.9–10.3)
Chloride: 89 mmol/L — ABNORMAL LOW (ref 98–111)
Creatinine, Ser: 1.52 mg/dL — ABNORMAL HIGH (ref 0.61–1.24)
GFR, Estimated: 54 mL/min — ABNORMAL LOW (ref >60)
Glucose, Bld: 286 mg/dL — ABNORMAL HIGH (ref 70–99)
Potassium: 4.2 mmol/L (ref 3.5–5.1)
Sodium: 129 mmol/L — ABNORMAL LOW (ref 135–145)

## 2021-08-01 LAB — MAGNESIUM
Magnesium: 1.5 mg/dL — ABNORMAL LOW (ref 1.7–2.4)
Magnesium: 1.9 mg/dL (ref 1.7–2.4)

## 2021-08-01 MED ORDER — MAGNESIUM SULFATE 4 GM/100ML IV SOLN
4.0000 g | Freq: Once | INTRAVENOUS | Status: AC
  Administered 2021-08-01: 4 g via INTRAVENOUS
  Filled 2021-08-01: qty 100

## 2021-08-01 MED ORDER — HYDROMORPHONE HCL 1 MG/ML IJ SOLN
0.5000 mg | Freq: Four times a day (QID) | INTRAMUSCULAR | Status: DC | PRN
  Administered 2021-08-01 – 2021-08-02 (×3): 0.5 mg via INTRAVENOUS
  Filled 2021-08-01 (×3): qty 1

## 2021-08-01 MED ORDER — LIDOCAINE 5 % EX PTCH
1.0000 | MEDICATED_PATCH | CUTANEOUS | Status: DC
  Administered 2021-08-01: 1 via TRANSDERMAL
  Filled 2021-08-01: qty 1

## 2021-08-01 MED ORDER — INSULIN DETEMIR 100 UNIT/ML ~~LOC~~ SOLN
23.0000 [IU] | Freq: Two times a day (BID) | SUBCUTANEOUS | Status: DC
  Administered 2021-08-01 – 2021-08-03 (×5): 23 [IU] via SUBCUTANEOUS
  Filled 2021-08-01 (×6): qty 0.23

## 2021-08-01 NOTE — Progress Notes (Signed)
[name], male   DOB: [birth year], 55 y.o.   MRN: [id number]  PROGRESS NOTE    [name]  QIH:[id number] DOB: 1966/01/20 DOA: 07/21/2021 PCP: Patient, No Pcp Per (Inactive)   Brief Narrative:  55 year old male with history of diabetes mellitus type 2, diabetic foot infection with prior right BKA, chronic diastolic CHF, hypertension, dyslipidemia presented with worsening left foot swelling/discoloration and discharge.  On presentation, he was hypotensive, tachycardic with A. fib with RVR with CT suggestive of first MTP osteomyelitis and soft tissue gas concerning for possible necrotizing fasciitis along with AKI with creatinine of 2.7 and mild DKA.  His blood pressures did not improve with IV fluids and he was transferred to ICU and PCCM was consulted.  Orthopedics was consulted.  He underwent left transtibial amputation on 07/23/2021.  He was found to have strep agalactiae bacteremia.  ID was consulted.  He was found to have vertebral discitis versus osteomyelitis on MRI.  He was also found to have new systolic dysfunction with LVEF of 35% along with new onset atrial flutter requiring IV amiodarone which has been switched to oral.  Cardiology was consulted.  He has been started on Eliquis.  S/p TEE followed by DCCV on 11/22.  Assessment & Plan:   Septic shock: Evolving on admission from bacteremia/diabetic foot infection -Off pressors.  Shock resolved. -Required care in ICU under PCCM service: Care has been transferred to [facility] from 07/27/2021 onwards  Diabetic foot infection, left -Status post left transtibial amputation on 07/23/2021.  Wound/wound VAC care as per Dr. Sharol Given.  Recommend having Dr. Sharol Given follow-up early next week while patient still hospitalized.  Group B streptococcus bacteremia, Possible thoracic discitis/osteomyelitis -TTE showed no vegetation.   -MRI of cervical/thoracic/lumbar spine showed possible thoracic osteomyelitis/discitis without evidence of abscess  but MRI was without contrast.   - Neurosurgery evaluation appreciated: Not convinced that patient has epidural abscess.  Recommended to manage conservatively with IV antibiotics. -Antibiotics have been changed to penicillin.  Patient will probably need 8 weeks of antibiotics per ID (2 weeks of parenteral therapy with penicillin from 07/23/2021-08/05/2021; then transition to amoxicillin 1 g 3 times daily till 09/14/2021). -Repeat blood cultures from 07/24/2021 were negative -TEE 11/22 without evidence of vegetation.  AKI on CKD stage IV, Acute metabolic acidosis -Presented with creatinine of 2.76.  Creatinine peaked to 3.29. -Creatinine gradually improving, plateaued in the 1.4 and slightly up to 1.52 today.  - Continue to trend daily BMPs while on IV Lasix. -Acidosis resolved. -Avoid nephrotoxins.  Hyponatremia -Corrected serum sodium to hyperglycemia is >130.  New systolic dysfunction in a patient with chronic diastolic heart failure -Echo showed EF of 35% (previous echo showed EF of 50 to 55% in 04/2021) -Strict input output.  Daily weights.  Fluid restriction.   -Cardiology following. -Weight and intake output documentation probably not fully accurate -As discussed with Cardiology, the cardiomyopathy may be tachycardia mediated in the context of new onset atrial flutter with RVR or possibly stress-induced cardiomyopathy from bacteremia and septic shock.   -Cardiology continue to adjust meds.  Increased IV Lasix to 80 mg twice daily, continuing metolazone 2.5 Mg daily, increasing Toprol-XL from 20-50 Mg daily. - -5.8 L thus far.  Weight down from 240 pounds on admission to 235 pounds. - Slowly improving.  New onset atrial flutter -Continue Toprol-XL. -IV amiodarone now switched to 200 Mg daily -Continue Eliquis 5 Mg twice daily -S/p TEE guided DCCV on 11/22 and currently in sinus rhythm. -Follow-up 2D echo in  a couple of months. -Cardiology following.  Remains in sinus  rhythm.  Hypomagnesemia -Persistent despite aggressive replacement.  Got IV magnesium sulfate 4 g this morning.  We will repeat now and replace as needed.  Leukocytosis -Still significant but improving.  Monitor.  Appears to have plateaued off.  Diabetes mellitus type 2 with hyperglycemia -Continue Lantus along with CBGs with SSI -Appears to be widely fluctuating.  Increased mealtime NovoLog to 5 units.  Will likely need further titration of insulins. - Patient reportedly snacking on graham crackers, counseled regarding dietary restrictions for diabetes.  Further increase Levemir from 20 to 23 units twice daily.  We will consult diabetes coordinator.  Thrombocytopenia -Resolved  Generalized deconditioning -PT recommends CIR.  A consult to CIR. as per TOC, patient's insurance declined CIR admission and plan to do peer to peer with insurance medical director when arranged by [facility]. - As per [facility], peer to peer cannot be done until Monday 11/28.  Advised that this will have to be arranged with the provider taking care of the patient on that day.  Acute on chronic pain -Follows with Dr. Herma Mering, pain management.  Has received steroid injections to the back. -Reviewed [place] PDMP and patient regularly fills opioid prescriptions.  Likely opioid dependent. - Obviously has reasons for acute pain from recent surgery, discitis/osteomyelitis. -Initiated OxyContin 10 Mg every 12 hours on 11/23. -Pain control is gradually improving.  We will cut back on IV Dilaudid.  Added lidocaine patch to mid low back.  Insomnia - Started trazodone 50 Mg at bedtime as needed on 11/24 with good effect, continue.  DVT prophylaxis: Apixaban Code Status: Full Family Communication: None at bedside Disposition Plan: Status is: Inpatient  Remains inpatient appropriate because: IV Lasix and further diuresis for decompensated CHF.  Consultants: Cardiology/orthopedic/ID/neurosurgery/PCCM/neurosurgery  Procedures: Left  transtibial amputation on 07/23/2021.  TEE and DCCV 11/22.  Antimicrobials:  Anti-infectives (From admission, onward)    Start     Dose/Rate Route Frequency Ordered Stop   08/06/21 0600  amoxicillin (AMOXIL) capsule 1,000 mg        1,000 mg Oral Every 8 hours 07/29/21 1001 09/17/21 0559   07/28/21 1000  penicillin G potassium 12 Million Units in dextrose 5 % 500 mL continuous infusion        12 Million Units 41.7 mL/hr over 12 Hours Intravenous Every 12 hours 07/28/21 0846 08/05/21 2359   07/25/21 1000  penicillin G potassium 8 Million Units in dextrose 5 % 500 mL continuous infusion  Status:  Discontinued        8 Million Units 41.7 mL/hr over 12 Hours Intravenous Every 12 hours 07/25/21 0854 07/28/21 0846   07/23/21 1345  cefTRIAXone (ROCEPHIN) 2 g in sodium chloride 0.9 % 100 mL IVPB  Status:  Discontinued        2 g 200 mL/hr over 30 Minutes Intravenous Every 24 hours 07/23/21 1246 07/25/21 0854   07/23/21 0815  ceFAZolin (ANCEF) IVPB 2g/100 mL premix  Status:  Discontinued        2 g 200 mL/hr over 30 Minutes Intravenous To [facility]Stay [facility] 07/23/21 0622 07/23/21 0836   07/22/21 2000  linezolid (ZYVOX) IVPB 600 mg  Status:  Discontinued        600 mg 300 mL/hr over 60 Minutes Intravenous Every 12 hours 07/22/21 1544 07/23/21 1349   07/22/21 0800  piperacillin-tazobactam (ZOSYN) IVPB 3.375 g  Status:  Discontinued        3.375 g 12.5 mL/hr over 240 Minutes Intravenous  Every 8 hours 07/22/21 0107 07/23/21 1246   07/22/21 0130  clindamycin (CLEOCIN) IVPB 600 mg  Status:  Discontinued        600 mg 100 mL/hr over 30 Minutes Intravenous Every 8 hours 07/22/21 0101 07/22/21 1544   07/22/21 0115  piperacillin-tazobactam (ZOSYN) IVPB 3.375 g        3.375 g 100 mL/hr over 30 Minutes Intravenous STAT 07/22/21 0107 07/22/21 0223   07/21/21 2335  vancomycin variable dose per unstable renal function (pharmacist dosing)  Status:  Discontinued         Does not apply See admin instructions  07/21/21 2335 07/22/21 1544   07/21/21 2000  vancomycin (VANCOREADY) IVPB 2000 mg/400 mL        2,000 mg 200 mL/hr over 120 Minutes Intravenous  Once 07/21/21 1955 07/22/21 0004   07/21/21 2000  cefTRIAXone (ROCEPHIN) 2 g in sodium chloride 0.9 % 100 mL IVPB        2 g 200 mL/hr over 30 Minutes Intravenous  Once 07/21/21 1955 07/21/21 2130        Subjective: Reports that pain control has gradually improved.  No pain reported in lower extremities.  Indicates that his upper mid back pain has now moved to his chronic mid low back area.  Reported that he slept quite well with trazodone.  Objective: Vitals:   07/31/21 1346 07/31/21 2008 08/01/21 0459 08/01/21 1003  BP:  (!) 142/84 108/62 128/77  Pulse: 74 82 73 71  Resp: 19 19 20    Temp: 98.7 F (37.1 C) 98.3 F (36.8 C) 98.5 F (36.9 C)   TempSrc: Oral Oral Oral   SpO2:  96% 97%   Weight:   107 kg   Height:        Intake/Output Summary (Last 24 hours) at 08/01/2021 1608 Last data filed at 08/01/2021 1428 Gross per 24 hour  Intake 460 ml  Output 5250 ml  Net -4790 ml   Filed Weights   07/27/21 0252 07/29/21 0754 08/01/21 0459  Weight: 108.2 kg 108.2 kg 107 kg    Examination:  General exam: Young male, moderately built and overweight lying propped up in bed.  Does not appear to be in any pain or uncomfortable. Respiratory system: Clear to auscultation.  No increased work of breathing. cardiovascular system: S1 and S2 heard, RRR.  No JVD or murmurs.  Slowly decreasing bilateral thigh and scrotal edema.  Telemetry personally reviewed: Sinus rhythm.   gastrointestinal system: Abdomen is mildly distended, soft and nontender.  Normal bowel sounds heard  extremities: Healed right BKA stump-has stump shrinker.  Left transtibial amputation site with wound VAC in place and on top-has a stump shrinker Central nervous system: Alert and oriented.  No focal neurological deficits noted  skin: No obvious ecchymosis/lesions  psychiatry:  Anxious.  Data Reviewed: I have personally reviewed following labs and imaging studies  CBC: Recent Labs  Lab 07/28/21 0055 07/29/21 0210 07/30/21 0144 07/31/21 0130 08/01/21 0259  WBC 17.1* 15.4* 15.0* 14.8* 13.9*  HGB 14.1 14.0 13.6 14.0 13.5  HCT 42.2 43.1 40.2 41.4 41.5  MCV 83.2 85.9 84.8 84.8 86.1  PLT 223 280 297 318 161   Basic Metabolic Panel: Recent Labs  Lab 07/28/21 0055 07/29/21 0210 07/30/21 0144 07/31/21 0130 08/01/21 0259  NA 132* 132* 128* 128* 129*  K 4.3 4.6 4.7 4.3 4.2  CL 101 100 96* 91* 89*  CO2 24 24 25 29  33*  GLUCOSE 252* 268* 359* 282* 286*  BUN  59* 44* 34* 28* 29*  CREATININE 1.75* 1.58* 1.43* 1.40* 1.52*  CALCIUM 8.0* 8.2* 7.9* 8.0* 8.1*  MG 1.9 1.6* 1.6* 1.6* 1.5*   GFR: Estimated Creatinine Clearance: 72.6 mL/min (A) (by C-G formula based on SCr of 1.52 mg/dL (H)). Liver Function Tests: No results for input(s): AST, ALT, ALKPHOS, BILITOT, PROT, ALBUMIN in the last 168 hours.   Coagulation Profile: Recent Labs  Lab 07/28/21 1557  INR 1.6*      CBG: Recent Labs  Lab 07/31/21 1116 07/31/21 1611 07/31/21 2014 08/01/21 0748 08/01/21 1225  GLUCAP 324* 279* 197* 331* 160*    Recent Results (from the past 240 hour(s))  MRSA Next Gen by PCR, Nasal     Status: Abnormal   Collection Time: 07/22/21  5:02 PM   Specimen: Nasal Mucosa; Nasal Swab  Result Value Ref Range Status   MRSA by PCR Next Gen DETECTED (A) NOT DETECTED Final    Comment: RESULT CALLED TO, READ BACK BY AND VERIFIED WITH: ASHLEY ORE RN 07/22/2021 @2051  BY JW (NOTE) The GeneXpert MRSA Assay (FDA approved for NASAL specimens only), is one component of a comprehensive MRSA colonization surveillance program. It is not intended to diagnose MRSA infection nor to guide or monitor treatment for MRSA infections. Test performance is not FDA approved in patients less than 81 years old. Performed at [facility], [place] [address] [place], [place] [address]    Surgical pcr screen     Status: Abnormal   Collection Time: 07/22/21  8:53 PM   Specimen: Nasal Mucosa; Nasal Swab  Result Value Ref Range Status   MRSA, PCR POSITIVE (A) NEGATIVE Final    Comment: RESULT CALLED TO, READ BACK BY AND VERIFIED WITH: A ORR,RN@2230  07/22/21 [place]    Staphylococcus aureus POSITIVE (A) NEGATIVE Final    Comment: (NOTE) The Xpert SA Assay (FDA approved for NASAL specimens in patients 57 years of age and older), is one component of a comprehensive surveillance program. It is not intended to diagnose infection nor to guide or monitor treatment. Performed at [facility], [place] [address] [place], [place] [address]   Culture, blood (routine x 2)     Status: None   Collection Time: 07/24/21 12:02 AM   Specimen: BLOOD  Result Value Ref Range Status   Specimen Description BLOOD RIGHT ARM  Final   Special Requests   Final    BOTTLES DRAWN AEROBIC AND ANAEROBIC Blood Culture adequate volume   Culture   Final    NO GROWTH 5 DAYS Performed at [facility], [address], [place], [place] [address]    Report Status 07/29/2021 FINAL  Final  Culture, blood (routine x 2)     Status: None   Collection Time: 07/24/21 12:02 AM   Specimen: BLOOD  Result Value Ref Range Status   Specimen Description BLOOD RIGHT ARM  Final   Special Requests   Final    BOTTLES DRAWN AEROBIC AND ANAEROBIC Blood Culture adequate volume   Culture   Final    NO GROWTH 5 DAYS Performed at [facility], [address], [place], [place] [address]    Report Status 07/29/2021 FINAL  Final          Radiology Studies: No results found.      Scheduled Meds:  amiodarone  200 mg Oral BID   [START ON 08/06/2021] amoxicillin  1,000 mg Oral Q8H   apixaban  5 mg Oral BID  vitamin C  1,000 mg Oral Daily   docusate sodium  100 mg Oral Daily   feeding supplement (GLUCERNA SHAKE)  237 mL Oral TID BM   furosemide  80 mg Intravenous BID   gabapentin  300 mg Oral TID    insulin aspart  0-15 Units Subcutaneous TID WC   insulin aspart  0-5 Units Subcutaneous QHS   insulin aspart  5 Units Subcutaneous TID WC   insulin detemir  23 Units Subcutaneous BID   lidocaine  1 patch Transdermal Q24H   metoprolol succinate  50 mg Oral Daily   oxyCODONE  10 mg Oral Q12H   polyethylene glycol  17 g Oral Daily   zinc sulfate  220 mg Oral Daily   Continuous Infusions:  sodium chloride 10 mL/hr at 07/29/21 0816   lactated ringers Stopped (07/23/21 1013)   penicillin g continuous IV infusion 12 Million Units (08/01/21 1242)       Vernell Leep, MD,  FACP, [facility], [facility], [facility] (Care Management Physician Certified) [place]  To contact the attending provider between 7A-7P or the covering provider during after hours 7P-7A, please log into the web site [email or web address] and access using universal [place] password for that web site. If you do not have the password, please call the hospital operator.

## 2021-08-01 NOTE — Progress Notes (Signed)
Inpatient Rehab Admissions Coordinator:   Attempted to set up peer to peer today, but they are closed.  Deadline is Monday at 5pm, so will call back on Monday.   Shann Medal, PT, DPT Admissions Coordinator [phone] 08/01/21  10:15 AM

## 2021-08-01 NOTE — Progress Notes (Signed)
Occupational Therapy Treatment Patient Details Name: [name] MRN: [id number] DOB: [birth year] Today's Date: 08/01/2021   History of present illness 55 yo admitted 11/14 with infected left foot s/p Left BKA 11/16. Pt with T6-8 discitis. PMhx: Rt BKA, Aflutter, DM, depression and anxiety   OT comments  Pt incontinent or urine and required mod - max A for clean up at bed level.  He was able to perform UB bathing with min A supine.  Recommend AIR.   Will continue to follow.     Recommendations for follow up therapy are one component of a multi-disciplinary discharge planning process, led by the attending physician.  Recommendations may be updated based on patient status, additional functional criteria and insurance authorization.    Follow Up Recommendations  Acute inpatient rehab (3hours/day)    Assistance Recommended at Discharge Frequent or constant Supervision/Assistance  Equipment Recommendations  None recommended by OT    Recommendations for Other Services Rehab consult    Precautions / Restrictions Precautions Precautions: Fall Precaution Comments: wound vac L LE Required Braces or Orthoses: Other Brace Other Brace: LLE limb protector       Mobility Bed Mobility Overal bed mobility: Needs Assistance Bed Mobility: Rolling Rolling: Mod assist;Min assist         General bed mobility comments: Pt rolled to Rt with mod A and to Lt with min A.  Heavy reliance on bedrails    Transfers                         Balance                                           ADL either performed or assessed with clinical judgement   ADL Overall ADL's : Needs assistance/impaired         Upper Body Bathing: Minimal assistance   Lower Body Bathing: Maximal assistance;Bed level   Upper Body Dressing : Maximal assistance;Bed level   Lower Body Dressing: Total assistance;Bed level       Toileting- Clothing Manipulation and Hygiene: Maximal  assistance;Bed level Toileting - Clothing Manipulation Details (indicate cue type and reason): Pt incontinent large amount of urine.  He was assisted with peri care.     Functional mobility during ADLs: Moderate assistance (bed mobility)      Extremity/Trunk Assessment Upper Extremity Assessment Upper Extremity Assessment: Generalized weakness   Lower Extremity Assessment Lower Extremity Assessment: Defer to PT evaluation        Vision       Perception     Praxis      Cognition Arousal/Alertness: Awake/alert Behavior During Therapy: [facility] for tasks assessed/performed;Anxious Overall Cognitive Status: Within Functional Limits for tasks assessed                                 General Comments: Pt asks same questions repeatedly          Exercises     Shoulder Instructions       General Comments      Pertinent Vitals/ Pain       Pain Assessment: Faces Faces Pain Scale: Hurts whole lot Pain Location: back, scrotum, L LE Pain Descriptors / Indicators: Grimacing;Guarding;Aching;Moaning Pain Intervention(s): Premedicated before session;Repositioned  Home Living  Prior Functioning/Environment              Frequency  Min 2X/week        Progress Toward Goals  OT Goals(current goals can now be found in the care plan section)  Progress towards OT goals: Progressing toward goals     Plan Discharge plan needs to be updated    Co-evaluation                 AM-PAC OT "6 Clicks" Daily Activity     Outcome Measure   Help from another person eating meals?: A Little Help from another person taking care of personal grooming?: A Little Help from another person toileting, which includes using toliet, bedpan, or urinal?: A Lot Help from another person bathing (including washing, rinsing, drying)?: A Lot Help from another person to put on and taking off regular upper body clothing?:  A Lot Help from another person to put on and taking off regular lower body clothing?: Total 6 Click Score: 13    End of Session    OT Visit Diagnosis: Muscle weakness (generalized) (M62.81);Pain;Other abnormalities of gait and mobility (R26.89) Pain - part of body:  (back)   Activity Tolerance Patient tolerated treatment well   Patient Left in bed;with call bell/phone within reach;with nursing/sitter in room   Nurse Communication Mobility status        Time: 9163-8466 OT Time Calculation (min): 39 min  Charges: OT General Charges $OT Visit: 1 Visit OT Treatments $Self Care/Home Management : 23-37 mins $Therapeutic Activity: 8-22 mins  [name]., OTR/L Acute Rehabilitation Services Pager [phone] Office [phone]   [name] M 08/01/2021, 1:28 PM

## 2021-08-01 NOTE — Progress Notes (Signed)
Progress Note  Patient Name: Tobi Leinweber Date of Encounter: 08/01/2021  [facility] Cardiologist: Sanda Klein, MD   Subjective   Breathing is O K  No CP  "Belly feels numb"  Inpatient Medications    Scheduled Meds:  amiodarone  200 mg Oral BID   [START ON 08/06/2021] amoxicillin  1,000 mg Oral Q8H   apixaban  5 mg Oral BID   vitamin C  1,000 mg Oral Daily   docusate sodium  100 mg Oral Daily   feeding supplement (GLUCERNA SHAKE)  237 mL Oral TID BM   furosemide  80 mg Intravenous BID   gabapentin  300 mg Oral TID   insulin aspart  0-15 Units Subcutaneous TID WC   insulin aspart  0-5 Units Subcutaneous QHS   insulin aspart  5 Units Subcutaneous TID WC   insulin detemir  23 Units Subcutaneous BID   metoprolol succinate  50 mg Oral Daily   oxyCODONE  10 mg Oral Q12H   polyethylene glycol  17 g Oral Daily   zinc sulfate  220 mg Oral Daily   Continuous Infusions:  sodium chloride 10 mL/hr at 07/29/21 0816   lactated ringers Stopped (07/23/21 1013)   penicillin g continuous IV infusion 12 Million Units (07/31/21 2313)   PRN Meds: acetaminophen, alum & mag hydroxide-simeth, bisacodyl, dextrose, diphenhydrAMINE, guaiFENesin-dextromethorphan, hydrALAZINE, HYDROmorphone (DILAUDID) injection, ondansetron, oxyCODONE, phenol, polyethylene glycol, traZODone   Vital Signs    Vitals:   07/31/21 0849 07/31/21 1346 07/31/21 2008 08/01/21 0459  BP: (!) 151/86  (!) 142/84 108/62  Pulse: 77 74 82 73  Resp:  19 19 20   Temp:  98.7 F (37.1 C) 98.3 F (36.8 C) 98.5 F (36.9 C)  TempSrc:  Oral Oral Oral  SpO2:   96% 97%  Weight:    107 kg  Height:        Intake/Output Summary (Last 24 hours) at 08/01/2021 0855 Last data filed at 07/31/2021 2313 Gross per 24 hour  Intake 360 ml  Output 4300 ml  Net -3940 ml   Positive 3.2  L from admit  Last 3 Weights 08/01/2021 07/29/2021 07/27/2021  Weight (lbs) 235 lb 14.3 oz 238 lb 8.6 oz 238 lb 8.6 oz  Weight (kg) 107 kg  108.2 kg 108.2 kg      Telemetry    Sinus rhythm  80s  -Reviewed   Physical Exam   Physical Exam Gen: Pt in NAD  Neuro: alert and oriented CV: r,r,r no murmurs.  No jugular venous distension at 45 degree Pulm: CLAB Abd: Soft   Mild diffuse tendernes  GU: testicular swelling   Ext: no pitting, some fluid, BL amputation Skin: warm and well perfused Psych: normal mood   Labs    High Sensitivity Troponin:   Recent Labs  Lab 07/22/21 0246 07/22/21 0645  TROPONINIHS 52* 44*     Chemistry Recent Labs  Lab 07/30/21 0144 07/31/21 0130 08/01/21 0259  NA 128* 128* 129*  K 4.7 4.3 4.2  CL 96* 91* 89*  CO2 25 29 33*  GLUCOSE 359* 282* 286*  BUN 34* 28* 29*  CREATININE 1.43* 1.40* 1.52*  CALCIUM 7.9* 8.0* 8.1*  MG 1.6* 1.6* 1.5*  GFRNONAA 58* 59* 54*  ANIONGAP 7 8 7     Lipids No results for input(s): CHOL, TRIG, HDL, LABVLDL, LDLCALC, CHOLHDL in the last 168 hours.  Hematology Recent Labs  Lab 07/30/21 0144 07/31/21 0130 08/01/21 0259  WBC 15.0* 14.8* 13.9*  RBC 4.74 4.88  4.82  HGB 13.6 14.0 13.5  HCT 40.2 41.4 41.5  MCV 84.8 84.8 86.1  MCH 28.7 28.7 28.0  MCHC 33.8 33.8 32.5  RDW 15.6* 15.5 15.2  PLT 297 318 349   Thyroid No results for input(s): TSH, FREET4 in the last 168 hours.  BNP Recent Labs  Lab 07/27/21 0126  BNP 1,368.1*    DDimer No results for input(s): DDIMER in the last 168 hours.   Radiology    No results found.  Cardiac Studies     Patient Profile     55 y.o. male with a hx of HFpEF (EF = 50%), HTN, DM2, CKD 3, osteomyelitis s/p R BKA who is being seen 07/22/2021 for the evaluation new diagnosis of systolic dysfunction  Assessment & Plan    Systolic dysfunction (new diagnosis this admission): May be tachy mediated vs stress induced  as he presented with septic shock. - 07/2021 echo LVEF 35%; mod RV dysfunction, PASP is 53 -  Previous (04/2021) echo 50-55% - Diuresing   Continue lasix 80 IV bid with 2.5 metolazone  On  Toprol XL    Hold other agents for now       2. New onset Aflutter - presented with RVR in the setting of sepsis -Cardioverted 11/22     - continues to be in sinus rhythm - started on amio this admission, IV has been transitioned to oral 200 bid   - Keep on eliquis 5 mg BID   3. Sepsis/bacteremia strep Agalactiae Bacteremia due to Diabetic Foot Infection  Initially septic shock, now off pressors TEE negative for endocarditis      4. AKI on CKD/Cardiorenal-  Cr improved from earlierin admit   now 1.52     For questions or updates, please contact [place] Please consult [email or web address] for contact info under        Signed, Dorris Carnes, MD  08/01/2021, 8:55 AM

## 2021-08-02 DIAGNOSIS — N179 Acute kidney failure, unspecified: Secondary | ICD-10-CM | POA: Diagnosis not present

## 2021-08-02 DIAGNOSIS — R6520 Severe sepsis without septic shock: Secondary | ICD-10-CM | POA: Diagnosis not present

## 2021-08-02 DIAGNOSIS — M4644 Discitis, unspecified, thoracic region: Secondary | ICD-10-CM | POA: Diagnosis not present

## 2021-08-02 DIAGNOSIS — Z794 Long term (current) use of insulin: Secondary | ICD-10-CM | POA: Diagnosis not present

## 2021-08-02 DIAGNOSIS — A419 Sepsis, unspecified organism: Secondary | ICD-10-CM | POA: Diagnosis not present

## 2021-08-02 DIAGNOSIS — E1142 Type 2 diabetes mellitus with diabetic polyneuropathy: Secondary | ICD-10-CM | POA: Diagnosis not present

## 2021-08-02 LAB — BASIC METABOLIC PANEL
Anion gap: 8 (ref 5–15)
BUN: 38 mg/dL — ABNORMAL HIGH (ref 6–20)
CO2: 35 mmol/L — ABNORMAL HIGH (ref 22–32)
Calcium: 8.3 mg/dL — ABNORMAL LOW (ref 8.9–10.3)
Chloride: 84 mmol/L — ABNORMAL LOW (ref 98–111)
Creatinine, Ser: 1.77 mg/dL — ABNORMAL HIGH (ref 0.61–1.24)
GFR, Estimated: 45 mL/min — ABNORMAL LOW (ref >60)
Glucose, Bld: 243 mg/dL — ABNORMAL HIGH (ref 70–99)
Potassium: 3.9 mmol/L (ref 3.5–5.1)
Sodium: 127 mmol/L — ABNORMAL LOW (ref 135–145)

## 2021-08-02 LAB — HEPATIC FUNCTION PANEL
ALT: 33 U/L (ref 0–44)
AST: 33 U/L (ref 15–41)
Albumin: <1.5 g/dL — ABNORMAL LOW (ref 3.5–5.0)
Alkaline Phosphatase: 77 U/L (ref 38–126)
Bilirubin, Direct: 0.1 mg/dL (ref 0.0–0.2)
Indirect Bilirubin: 0.6 mg/dL (ref 0.3–0.9)
Total Bilirubin: 0.7 mg/dL (ref 0.3–1.2)
Total Protein: 5.4 g/dL — ABNORMAL LOW (ref 6.5–8.1)

## 2021-08-02 LAB — MAGNESIUM: Magnesium: 1.7 mg/dL (ref 1.7–2.4)

## 2021-08-02 LAB — GLUCOSE, CAPILLARY
Glucose-Capillary: 322 mg/dL — ABNORMAL HIGH (ref 70–99)
Glucose-Capillary: 168 mg/dL — ABNORMAL HIGH (ref 70–99)
Glucose-Capillary: 142 mg/dL — ABNORMAL HIGH (ref 70–99)
Glucose-Capillary: 161 mg/dL — ABNORMAL HIGH (ref 70–99)
Glucose-Capillary: 278 mg/dL — ABNORMAL HIGH (ref 70–99)

## 2021-08-02 MED ORDER — HYDRALAZINE HCL 25 MG PO TABS
25.0000 mg | ORAL_TABLET | Freq: Three times a day (TID) | ORAL | Status: DC
  Administered 2021-08-02 – 2021-08-09 (×19): 25 mg via ORAL
  Filled 2021-08-02 (×22): qty 1

## 2021-08-02 MED ORDER — LIDOCAINE 5 % EX PTCH
2.0000 | MEDICATED_PATCH | CUTANEOUS | Status: DC
Start: 2021-08-02 — End: 2021-08-25
  Administered 2021-08-02 – 2021-08-19 (×3): 2 via TRANSDERMAL
  Filled 2021-08-02 (×11): qty 2

## 2021-08-02 MED ORDER — ISOSORBIDE MONONITRATE ER 30 MG PO TB24
30.0000 mg | ORAL_TABLET | Freq: Every day | ORAL | Status: DC
  Administered 2021-08-02 – 2021-09-01 (×31): 30 mg via ORAL
  Filled 2021-08-02 (×31): qty 1

## 2021-08-02 MED ORDER — HYDROMORPHONE HCL 1 MG/ML IJ SOLN
0.5000 mg | INTRAMUSCULAR | Status: DC | PRN
  Administered 2021-08-02 – 2021-08-08 (×27): 0.5 mg via INTRAVENOUS
  Filled 2021-08-02 (×29): qty 1

## 2021-08-02 MED ORDER — MAGNESIUM SULFATE 2 GM/50ML IV SOLN
2.0000 g | Freq: Once | INTRAVENOUS | Status: AC
  Administered 2021-08-02: 2 g via INTRAVENOUS
  Filled 2021-08-02: qty 50

## 2021-08-02 MED ORDER — ACETAMINOPHEN 500 MG PO TABS
1000.0000 mg | ORAL_TABLET | Freq: Three times a day (TID) | ORAL | Status: DC
  Administered 2021-08-02 – 2021-09-09 (×114): 1000 mg via ORAL
  Filled 2021-08-02 (×113): qty 2

## 2021-08-02 NOTE — Progress Notes (Signed)
[name], male   DOB: [birth year], 55 y.o.   MRN: [id number]  PROGRESS NOTE    [name]  [name] DOB: 1966-08-20 DOA: 07/21/2021 PCP: Patient, No Pcp Per (Inactive)   Brief Narrative:  55 year old male with history of diabetes mellitus type 2, diabetic foot infection with prior right BKA, chronic diastolic CHF, hypertension, dyslipidemia presented with worsening left foot swelling/discoloration and discharge.  On presentation, he was hypotensive, tachycardic with A. fib with RVR with CT suggestive of first MTP osteomyelitis and soft tissue gas concerning for possible necrotizing fasciitis along with AKI with creatinine of 2.7 and mild DKA.  His blood pressures did not improve with IV fluids and he was transferred to ICU and PCCM was consulted.  Orthopedics was consulted.  He underwent left transtibial amputation on 07/23/2021.  He was found to have strep agalactiae bacteremia.  ID was consulted.  He was found to have vertebral discitis versus osteomyelitis on MRI.  He was also found to have new systolic dysfunction with LVEF of 35% along with new onset atrial flutter requiring IV amiodarone which has been switched to oral.  Cardiology was consulted.  He has been started on Eliquis.  S/p TEE followed by DCCV on 11/22.  Assessment & Plan:   Septic shock: Evolving on admission from bacteremia/diabetic foot infection -Off pressors.  Shock resolved. -Required care in ICU under PCCM service: Care has been transferred to [facility] from 07/27/2021 onwards  Diabetic foot infection, left -Status post left transtibial amputation on 07/23/2021.  Wound/wound VAC care as per Dr. Sharol [name].  Recommend having Dr. Sharol [name] follow-up early next week while patient still hospitalized.  Group B streptococcus bacteremia, Possible thoracic discitis/osteomyelitis -TTE showed no vegetation.   -MRI of cervical/thoracic/lumbar spine showed possible thoracic osteomyelitis/discitis without evidence of abscess  but MRI was without contrast.   - Neurosurgery evaluation appreciated: Not convinced that patient has epidural abscess.  Recommended to manage conservatively with IV antibiotics. -Antibiotics have been changed to penicillin.  Patient will probably need 8 weeks of antibiotics per ID (2 weeks of parenteral therapy with penicillin from 07/23/2021-08/05/2021; then transition to amoxicillin 1 g 3 times daily till 09/14/2021). -Repeat blood cultures from 07/24/2021 were negative -TEE 11/22 without evidence of vegetation.  AKI on CKD stage IV, Acute metabolic acidosis -Presented with creatinine of 2.76.  Creatinine peaked to 3.29. -Creatinine gradually improving, plateaued in the 1.4 and then has gradually increased to 1.77 today. -AKI secondary to aggressive diuresis at this time.  Holding Lasix. -Acidosis resolved. -Avoid nephrotoxins.  Hyponatremia -Corrected serum sodium to hyperglycemia is 129  New onset systolic CHF -Echo showed EF of 35% (previous echo showed EF of 50 to 55% in 04/2021) -Strict intake output, daily weights and fluid restriction.  Weight recordings likely inaccurate, weight has gone down from 340 pounds on 11/15 to 235 pounds on 11/25.  -7.3 L thus far. -Cardiomyopathy may be from new onset atrial flutter RVR related tachycardia versus stress induced cardiomyopathy from bacteremia and septic shock. -Cardiology closely managing and adjusting meds.  Was on IV Lasix 80 mg twice daily and metolazone 2.5 Mg daily but creatinine steadily rising and hence holding Lasix today.  Still remains volume overloaded. - Continuing Toprol-XL 50 Mg daily.  Added hydralazine 25 Mg 3 times daily and Imdur 30 mg daily.  No ACEI/ARB/ARN I/MRA due to AKI.  New onset atrial flutter -Continue Toprol-XL 50 Mg daily. -IV amiodarone now switched to 200 Mg twice daily x7 days and then 200 Mg daily. -  Continue Eliquis 5 Mg twice daily -S/p TEE guided DCCV on 11/22 and currently in sinus rhythm. -Follow-up  2D echo in a couple of months. -Cardiology following.  Remains in sinus rhythm.  Hypomagnesemia -Persistent despite aggressive replacement.  Finally magnesium up to 1.9 yesterday evening but down again to 1.7 this morning.  We will replace 2 g IV x1.  Leukocytosis -Still significant but improving.  Monitor.  Appears to have plateaued off.  Diabetes mellitus type 2 with hyperglycemia -Continue Lantus along with CBGs with SSI -Appears to be widely fluctuating.  Increased mealtime NovoLog to 5 units.  Will likely need further titration of insulins. - Patient reportedly snacking on graham crackers, counseled regarding dietary restrictions for diabetes.  Further increase Levemir from 20 to 23 units twice daily.  We will consult diabetes coordinator. - Better controlled but still widely fluctuating.  Continue current regimen  Thrombocytopenia -Resolved  Generalized deconditioning -PT recommends CIR.  A consult to CIR. as per TOC, patient's insurance declined CIR admission and plan to do peer to peer with insurance medical director when arranged by [facility]. - As per [facility], peer to peer cannot be done until Monday 11/28.  Advised that this will have to be arranged with the provider taking care of the patient on that day.  Acute on chronic pain -Follows with Dr. Herma [name], pain management.  Has received steroid injections to the back.  Patient would like MD on 11/28 to try and reach out to his pain MD for management suggestions -Reviewed [place] PDMP and patient regularly fills opioid prescriptions.  Likely opioid dependent. - Obviously has reasons for acute pain from recent surgery, discitis/osteomyelitis. -Initiated OxyContin 10 Mg every 12 hours on 11/23. -Pain control overall is better but reports that the every 6 hours reduced dose IV Dilaudid is not helping, increased as below.    Patient on following regimen after making changes: - Tylenol 1 g 3 times daily.  LFTs in mid November were unremarkable.   Repeating LFTs today. - Oxy IR 10 mg every 4 hourly as needed for moderate pain - OxyContin 10 Mg every 12 hours - Lidocaine patch increased to 2 patches - Dilaudid 0.5 mg IV every 4 hourly as needed severe pain - Gabapentin 300 mg 3 times daily  Insomnia - Started trazodone 50 Mg at bedtime as needed on 11/24 with good effect, continue.  DVT prophylaxis: Apixaban Code Status: Full Family Communication: None at bedside Disposition Plan: Status is: Inpatient  Remains inpatient appropriate because: IV Lasix and further diuresis for decompensated CHF.  Consultants: Cardiology/orthopedic/ID/neurosurgery/PCCM/neurosurgery  Procedures: Left transtibial amputation on 07/23/2021.  TEE and DCCV 11/22.  Antimicrobials:  Anti-infectives (From admission, onward)    Start     Dose/Rate Route Frequency Ordered Stop   08/06/21 0600  amoxicillin (AMOXIL) capsule 1,000 mg        1,000 mg Oral Every 8 hours 07/29/21 1001 09/17/21 0559   07/28/21 1000  penicillin G potassium 12 Million Units in dextrose 5 % 500 mL continuous infusion        12 Million Units 41.7 mL/hr over 12 Hours Intravenous Every 12 hours 07/28/21 0846 08/05/21 2359   07/25/21 1000  penicillin G potassium 8 Million Units in dextrose 5 % 500 mL continuous infusion  Status:  Discontinued        8 Million Units 41.7 mL/hr over 12 Hours Intravenous Every 12 hours 07/25/21 0854 07/28/21 0846   07/23/21 1345  cefTRIAXone (ROCEPHIN) 2 g in sodium chloride 0.9 %  100 mL IVPB  Status:  Discontinued        2 g 200 mL/hr over 30 Minutes Intravenous Every 24 hours 07/23/21 1246 07/25/21 0854   07/23/21 0815  ceFAZolin (ANCEF) IVPB 2g/100 mL premix  Status:  Discontinued        2 g 200 mL/hr over 30 Minutes Intravenous To [facility] 07/23/21 0622 07/23/21 0836   07/22/21 2000  linezolid (ZYVOX) IVPB 600 mg  Status:  Discontinued        600 mg 300 mL/hr over 60 Minutes Intravenous Every 12 hours 07/22/21 1544 07/23/21 1349    07/22/21 0800  piperacillin-tazobactam (ZOSYN) IVPB 3.375 g  Status:  Discontinued        3.375 g 12.5 mL/hr over 240 Minutes Intravenous Every 8 hours 07/22/21 0107 07/23/21 1246   07/22/21 0130  clindamycin (CLEOCIN) IVPB 600 mg  Status:  Discontinued        600 mg 100 mL/hr over 30 Minutes Intravenous Every 8 hours 07/22/21 0101 07/22/21 1544   07/22/21 0115  piperacillin-tazobactam (ZOSYN) IVPB 3.375 g        3.375 g 100 mL/hr over 30 Minutes Intravenous STAT 07/22/21 0107 07/22/21 0223   07/21/21 2335  vancomycin variable dose per unstable renal function (pharmacist dosing)  Status:  Discontinued         Does not apply See admin instructions 07/21/21 2335 07/22/21 1544   07/21/21 2000  vancomycin (VANCOREADY) IVPB 2000 mg/400 mL        2,000 mg 200 mL/hr over 120 Minutes Intravenous  Once 07/21/21 1955 07/22/21 0004   07/21/21 2000  cefTRIAXone (ROCEPHIN) 2 g in sodium chloride 0.9 % 100 mL IVPB        2 g 200 mL/hr over 30 Minutes Intravenous  Once 07/21/21 1955 07/21/21 2130        Subjective: Reports that IV Dilaudid every 6 hours does not last for his degree of pain.  Asking to increase the frequency.  No dyspnea.  Wants [place] to contact his pain MD on Monday.  Objective: Vitals:   08/02/21 0408 08/02/21 0810 08/02/21 0854 08/02/21 1115  BP: 115/75 119/65  115/65  Pulse: 70     Resp: (!) 21 16 19 16   Temp: 98.1 F (36.7 C) 98.2 F (36.8 C)  98 F (36.7 C)  TempSrc: Oral Oral    SpO2: 97%     Weight:      Height:        Intake/Output Summary (Last 24 hours) at 08/02/2021 1252 Last data filed at 08/02/2021 0300 Gross per 24 hour  Intake 220 ml  Output 1600 ml  Net -1380 ml   Filed Weights   07/27/21 0252 07/29/21 0754 08/01/21 0459  Weight: 108.2 kg 108.2 kg 107 kg    Examination:  General exam: Young male, moderately built and overweight lying propped up in bed.  When I walked into the room, patient was sound asleep and snoring.  Did not wake up to door  knock or call and only woke up when I touched him.  On waking up, started complaining of pain. Respiratory system: Clear to auscultation.  No increased work of breathing. cardiovascular system: S1 and S2 heard, RRR.  No JVD or murmurs.  Slowly decreasing bilateral thigh and scrotal edema, but still present.  Telemetry personally reviewed: Sinus rhythm.   gastrointestinal system: Abdomen is mildly distended, soft and nontender.  Normal bowel sounds heard  extremities: Healed right BKA stump-has stump shrinker.  Left transtibial amputation site with wound VAC in place and on top-has a stump shrinker Central nervous system: Alert and oriented.  No focal neurological deficits noted  skin: No obvious ecchymosis/lesions  psychiatry: Anxious.  Data Reviewed: I have personally reviewed following labs and imaging studies  CBC: Recent Labs  Lab 07/28/21 0055 07/29/21 0210 07/30/21 0144 07/31/21 0130 08/01/21 0259  WBC 17.1* 15.4* 15.0* 14.8* 13.9*  HGB 14.1 14.0 13.6 14.0 13.5  HCT 42.2 43.1 40.2 41.4 41.5  MCV 83.2 85.9 84.8 84.8 86.1  PLT 223 280 297 318 338   Basic Metabolic Panel: Recent Labs  Lab 07/29/21 0210 07/30/21 0144 07/31/21 0130 08/01/21 0259 08/01/21 1634 08/02/21 0232  NA 132* 128* 128* 129*  --  127*  K 4.6 4.7 4.3 4.2  --  3.9  CL 100 96* 91* 89*  --  84*  CO2 24 25 29  33*  --  35*  GLUCOSE 268* 359* 282* 286*  --  243*  BUN 44* 34* 28* 29*  --  38*  CREATININE 1.58* 1.43* 1.40* 1.52*  --  1.77*  CALCIUM 8.2* 7.9* 8.0* 8.1*  --  8.3*  MG 1.6* 1.6* 1.6* 1.5* 1.9 1.7   GFR: Estimated Creatinine Clearance: 62.4 mL/min (A) (by C-G formula based on SCr of 1.77 mg/dL (H)). Liver Function Tests: No results for input(s): AST, ALT, ALKPHOS, BILITOT, PROT, ALBUMIN in the last 168 hours.   Coagulation Profile: Recent Labs  Lab 07/28/21 1557  INR 1.6*      CBG: Recent Labs  Lab 08/01/21 1225 08/01/21 1649 08/01/21 2136 08/02/21 0808 08/02/21 1111  GLUCAP  160* 203* 190* 322* 168*    Recent Results (from the past 240 hour(s))  Culture, blood (routine x 2)     Status: None   Collection Time: 07/24/21 12:02 AM   Specimen: BLOOD  Result Value Ref Range Status   Specimen Description BLOOD RIGHT ARM  Final   Special Requests   Final    BOTTLES DRAWN AEROBIC AND ANAEROBIC Blood Culture adequate volume   Culture   Final    NO GROWTH 5 DAYS Performed at [facility], [place] [address], [place], [place] [address]    Report Status 07/29/2021 FINAL  Final  Culture, blood (routine x 2)     Status: None   Collection Time: 07/24/21 12:02 AM   Specimen: BLOOD  Result Value Ref Range Status   Specimen Description BLOOD RIGHT ARM  Final   Special Requests   Final    BOTTLES DRAWN AEROBIC AND ANAEROBIC Blood Culture adequate volume   Culture   Final    NO GROWTH 5 DAYS Performed at [facility], [address], [place], [place] [address]    Report Status 07/29/2021 FINAL  Final          Radiology Studies: No results found.      Scheduled Meds:  acetaminophen  1,000 mg Oral TID   amiodarone  200 mg Oral BID   [START ON 08/06/2021] amoxicillin  1,000 mg Oral Q8H   apixaban  5 mg Oral BID   vitamin C  1,000 mg Oral Daily   docusate sodium  100 mg Oral Daily   feeding supplement (GLUCERNA SHAKE)  237 mL Oral TID BM   gabapentin  300 mg Oral TID   hydrALAZINE  25 mg Oral Q8H   insulin aspart  0-15 Units Subcutaneous TID WC   insulin aspart  0-5 Units Subcutaneous QHS  insulin aspart  5 Units Subcutaneous TID WC   insulin detemir  23 Units Subcutaneous BID   isosorbide mononitrate  30 mg Oral Daily   lidocaine  2 patch Transdermal Q24H   metoprolol succinate  50 mg Oral Daily   oxyCODONE  10 mg Oral Q12H   polyethylene glycol  17 g Oral Daily   zinc sulfate  220 mg Oral Daily   Continuous Infusions:  sodium chloride 10 mL/hr at 07/29/21 0816   lactated ringers Stopped (07/23/21 1013)   penicillin g continuous IV  infusion 12 Million Units (08/01/21 2112)       Vernell Leep, MD,  FACP, [facility], [facility], [facility] (Care Management Physician Certified) [place]  To contact the attending provider between 7A-7P or the covering provider during after hours 7P-7A, please log into the web site [email or web address] and access using universal [place] password for that web site. If you do not have the password, please call the hospital operator.

## 2021-08-02 NOTE — Plan of Care (Signed)
°  Problem: Clinical Measurements: °Goal: Respiratory complications will improve °Outcome: Progressing °Goal: Cardiovascular complication will be avoided °Outcome: Progressing °  °Problem: Activity: °Goal: Risk for activity intolerance will decrease °Outcome: Progressing °  °

## 2021-08-02 NOTE — Progress Notes (Signed)
Cardiology Progress Note  [name] MRN: [id number] DOB: Jul 29, 1966 Date of Encounter: 08/02/2021  Primary Cardiologist: Sanda Klein, MD  Subjective   Chief Complaint: Back pain  HPI: Breathing comfortably.  Reports back pain.  Denies chest pain or trouble breathing.  ROS:  All other ROS reviewed and negative. Pertinent positives noted in the HPI.     Inpatient Medications  Scheduled Meds:  amiodarone  200 mg Oral BID   [START ON 08/06/2021] amoxicillin  1,000 mg Oral Q8H   apixaban  5 mg Oral BID   vitamin C  1,000 mg Oral Daily   docusate sodium  100 mg Oral Daily   feeding supplement (GLUCERNA SHAKE)  237 mL Oral TID BM   gabapentin  300 mg Oral TID   hydrALAZINE  25 mg Oral Q8H   insulin aspart  0-15 Units Subcutaneous TID WC   insulin aspart  0-5 Units Subcutaneous QHS   insulin aspart  5 Units Subcutaneous TID WC   insulin detemir  23 Units Subcutaneous BID   isosorbide mononitrate  30 mg Oral Daily   lidocaine  1 patch Transdermal Q24H   metoprolol succinate  50 mg Oral Daily   oxyCODONE  10 mg Oral Q12H   polyethylene glycol  17 g Oral Daily   zinc sulfate  220 mg Oral Daily   Continuous Infusions:  sodium chloride 10 mL/hr at 07/29/21 0816   lactated ringers Stopped (07/23/21 1013)   penicillin g continuous IV infusion 12 Million Units (08/01/21 2112)   PRN Meds: acetaminophen, alum & mag hydroxide-simeth, bisacodyl, diphenhydrAMINE, guaiFENesin-dextromethorphan, hydrALAZINE, HYDROmorphone (DILAUDID) injection, ondansetron, oxyCODONE, phenol, polyethylene glycol, traZODone   Vital Signs   Vitals:   08/01/21 2037 08/02/21 0026 08/02/21 0408 08/02/21 0810  BP: 120/67 (!) 117/59 115/75 119/65  Pulse:   70   Resp: 14 16 (!) 21 16  Temp: 98.9 F (37.2 C) 98.3 F (36.8 C) 98.1 F (36.7 C) 98.2 F (36.8 C)  TempSrc: Oral Oral Oral Oral  SpO2: 96% 98% 97%   Weight:      Height:        Intake/Output Summary (Last 24 hours) at  08/02/2021 8841 Last data filed at 08/02/2021 0300 Gross per 24 hour  Intake 220 ml  Output 4300 ml  Net -4080 ml   Last 3 Weights 08/01/2021 07/29/2021 07/27/2021  Weight (lbs) 235 lb 14.3 oz 238 lb 8.6 oz 238 lb 8.6 oz  Weight (kg) 107 kg 108.2 kg 108.2 kg      Telemetry  Overnight telemetry shows sinus rhythm in the 60s, which I personally reviewed.   Physical Exam   Vitals:   08/01/21 2037 08/02/21 0026 08/02/21 0408 08/02/21 0810  BP: 120/67 (!) 117/59 115/75 119/65  Pulse:   70   Resp: 14 16 (!) 21 16  Temp: 98.9 F (37.2 C) 98.3 F (36.8 C) 98.1 F (36.7 C) 98.2 F (36.8 C)  TempSrc: Oral Oral Oral Oral  SpO2: 96% 98% 97%   Weight:      Height:        Intake/Output Summary (Last 24 hours) at 08/02/2021 6606 Last data filed at 08/02/2021 0300 Gross per 24 hour  Intake 220 ml  Output 4300 ml  Net -4080 ml    Last 3 Weights 08/01/2021 07/29/2021 07/27/2021  Weight (lbs) 235 lb 14.3 oz 238 lb 8.6 oz 238 lb 8.6 oz  Weight (kg) 107 kg 108.2 kg 108.2 kg    Body mass index is  29.48 kg/m.  General: Well nourished, well developed, in no acute distress Head: Atraumatic, normal size  Eyes: PEERLA, EOMI  Neck: Supple, JVD 7 8 cm of water Endocrine: No thryomegaly Cardiac: Normal S1, S2; RRR; no murmurs, rubs, or gallops Lungs: Clear to auscultation bilaterally, no wheezing, rhonchi or rales  Abd: Soft, nontender, no hepatomegaly  Ext: R BKA, LLE amputation, scrotal edema Skin: Warm and dry, no rashes   Neuro: Alert and oriented to person, place, time, and situation, CNII-XII grossly intact, no focal deficits  Psych: Normal mood and affect   Labs  High Sensitivity Troponin:   Recent Labs  Lab 07/22/21 0246 07/22/21 0645  TROPONINIHS 52* 44*     Cardiac EnzymesNo results for input(s): TROPONINI in the last 168 hours. No results for input(s): TROPIPOC in the last 168 hours.  Chemistry Recent Labs  Lab 07/31/21 0130 08/01/21 0259 08/02/21 0232  NA 128*  129* 127*  K 4.3 4.2 3.9  CL 91* 89* 84*  CO2 29 33* 35*  GLUCOSE 282* 286* 243*  BUN 28* 29* 38*  CREATININE 1.40* 1.52* 1.77*  CALCIUM 8.0* 8.1* 8.3*  GFRNONAA 59* 54* 45*  ANIONGAP 8 7 8     Hematology Recent Labs  Lab 07/30/21 0144 07/31/21 0130 08/01/21 0259  WBC 15.0* 14.8* 13.9*  RBC 4.74 4.88 4.82  HGB 13.6 14.0 13.5  HCT 40.2 41.4 41.5  MCV 84.8 84.8 86.1  MCH 28.7 28.7 28.0  MCHC 33.8 33.8 32.5  RDW 15.6* 15.5 15.2  PLT 297 318 349   BNP Recent Labs  Lab 07/27/21 0126  BNP 1,368.1*    DDimer No results for input(s): DDIMER in the last 168 hours.   Radiology  No results found.  Cardiac Studies  TTE 07/23/2021  1. Left ventricular ejection fraction, by estimation, is 35%. The left  ventricle has moderately decreased function. The left ventricle  demonstrates global hypokinesis. There is mild left ventricular  hypertrophy. Left ventricular diastolic parameters are   indeterminate.   2. Right ventricular systolic function is moderately reduced. The right  ventricular size is normal. There is moderately elevated pulmonary artery  systolic pressure. The estimated right ventricular systolic pressure is  69.6 mmHg.   3. Left atrial size was mildly dilated.   4. Right atrial size was mildly dilated.   5. The mitral valve is normal in structure. Trivial mitral valve  regurgitation. No evidence of mitral stenosis.   6. The aortic valve is tricuspid. Aortic valve regurgitation is not  visualized. No aortic stenosis is present.   7. Aortic dilatation noted. There is mild dilatation of the aortic root,  measuring 39 mm.   8. The inferior vena cava is dilated in size with <50% respiratory  variability, suggesting right atrial pressure of 15 mmHg.   9. No vegetation visualized, but if high suspicion would need TEE.  10. The patient was in atrial fibrillation.   Patient Profile  Nolberto Cheuvront is a 55 y.o. male with heart failure with preserved ejection  fraction, uncontrolled diabetes, CKD stage III who was admitted on 07/22/2021 with sepsis secondary to left lower extremity osteomyelitis.  Cardiology was consulted due to new onset atrial flutter as well as new onset systolic heart failure.  Assessment & Plan   #New onset systolic heart failure, EF 35% -Admitted to ICU with sepsis due to diabetic foot infection the left lower extremity.  He is status post left transtibial amputation.  Blood cultures were positive for strep organism. -Was also  in atrial flutter. -Cardiomyopathy could be related to sepsis versus arrhythmia related. -He is undergone TEE/cardioversion on 07/29/2021.  No signs of endocarditis. -Maintaining sinus rhythm on amiodarone 200 mg twice daily.  We will continue this for 7 days and transition to 200 mg daily. -He is on metoprolol succinate 50 mg daily.  Kidney function is climbing up.  I have added hydralazine 25 mg 3 times daily as well as Imdur 30 mg daily.  No ACE/ARB/Arni/MRA given slight rise in creatinine.  If this improves we will start him on appropriate therapy. -He stills appears volume overloaded.  Given rising creatinine I would like to give him a Lasix holiday.  Hold Lasix today. -Close monitoring of electrolytes while in house. -Overall I favor medical therapy until he completes his antibiotics.  He now complains of back pain in the setting of osteomyelitis of left lower extremity I suspect this will need to be evaluated and I will defer this to hospital medicine.  He is not a great candidate for left heart catheterization at the moment.  #New onset atrial flutter status post TEE/cardioversion -Continue amiodarone as above. -On Eliquis 5 mg twice daily. -Will need to be on this uninterrupted for at least 1 month after cardioversion.  #Sepsis/GBS Bacteremia -on Abx -no signs of endocarditis on TEE  For questions or updates, please contact [place] Please consult [email or web address] for contact info under    Time Spent with Patient: I have spent a total of 35 minutes with patient reviewing hospital notes, telemetry, EKGs, labs and examining the patient as well as establishing an assessment and plan that was discussed with the patient.  > 50% of time was spent in direct patient care.    Signed, Addison Naegeli. Audie Box, MD, [place]  08/02/2021 9:22 AM

## 2021-08-02 NOTE — Progress Notes (Signed)
Physical Therapy Treatment Patient Details Name: [name] MRN: [id number] DOB: 04/12/1966 Today's Date: 08/02/2021   History of Present Illness 55 yo admitted 11/14 with infected left foot s/p Left BKA 11/16. Pt with T6-8 discitis. PMhx: Rt BKA, Aflutter, DM, depression and anxiety    PT Comments    Progress toward goals limited by intense pain.  Emphasis today on rolling and bed mobility due to pt needed peri-care session.  Attempted  to work toward Ant/Post transfer, but once sitting EOB with bil UE assist and min external support, the pain was too intense to progress further.  After >= 1 min, pt returned to supine.  Pt needed repositioning and another assist with toileting.    Recommendations for follow up therapy are one component of a multi-disciplinary discharge planning process, led by the attending physician.  Recommendations may be updated based on patient status, additional functional criteria and insurance authorization.  Follow Up Recommendations  Acute inpatient rehab (3hours/day)     Assistance Recommended at Discharge Frequent or constant Supervision/Assistance  Equipment Recommendations  Wheelchair (measurements PT);Wheelchair cushion (measurements PT)    Recommendations for Other Services       Precautions / Restrictions Precautions Precautions: Fall Precaution Comments: wound vac L LE Required Braces or Orthoses: Other Brace Other Brace: LLE limb protector     Mobility  Bed Mobility Overal bed mobility: Needs Assistance Bed Mobility: Rolling;Sidelying to Sit;Sit to Sidelying Rolling: Mod assist Sidelying to sit: Mod assist;+2 for safety/equipment     Sit to sidelying: Mod assist;+2 for physical assistance General bed mobility comments: pt needed to roll multiple times for several bouts of urinary incontinence.  Very painful.  Significant truncal and stability assist during transitions to /from sidelying.    Transfers                    General transfer comment: Back pain so intense pt was unable to attempt ant/post transfer.    Ambulation/Gait                   Stairs             Wheelchair Mobility    Modified Rankin (Stroke Patients Only)       Balance   Sitting-balance support: Bilateral upper extremity supported Sitting balance-Leahy Scale: Poor Sitting balance - Comments: UE support for balance but today needed light min stability assist due to intensity of the pain.                                    Cognition Arousal/Alertness: Awake/alert Behavior During Therapy: WFL for tasks assessed/performed;Anxious Overall Cognitive Status: Within Functional Limits for tasks assessed                                          Exercises      General Comments        Pertinent Vitals/Pain Faces Pain Scale: Hurts worst Pain Location: back, scrotum, L LE Pain Descriptors / Indicators: Grimacing;Guarding;Aching;Moaning Pain Intervention(s): Limited activity within patient's tolerance;Repositioned;Premedicated before session;Other (comment) (2 lidocaine patches)    Home Living                          Prior Function  PT Goals (current goals can now be found in the care plan section) Acute Rehab PT Goals PT Goal Formulation: With patient Time For Goal Achievement: 08/07/21 Potential to Achieve Goals: Fair Progress towards PT goals: Not progressing toward goals - comment (papin limiting)    Frequency    Min 3X/week      PT Plan Current plan remains appropriate    Co-evaluation              AM-PAC PT "6 Clicks" Mobility   Outcome Measure  Help needed turning from your back to your side while in a flat bed without using bedrails?: A Lot Help needed moving from lying on your back to sitting on the side of a flat bed without using bedrails?: A Lot Help needed moving to and from a bed to a chair (including a wheelchair)?:  Total Help needed standing up from a chair using your arms (e.g., wheelchair or bedside chair)?: Total Help needed to walk in hospital room?: Total Help needed climbing 3-5 steps with a railing? : Total 6 Click Score: 8    End of Session   Activity Tolerance: Patient limited by pain Patient left: in bed;with call bell/phone within reach;with nursing/sitter in room Nurse Communication: Mobility status PT Visit Diagnosis: Other abnormalities of gait and mobility (R26.89);Muscle weakness (generalized) (M62.81)     Time: 5697-9480 PT Time Calculation (min) (ACUTE ONLY): 28 min  Charges:  $Therapeutic Activity: 23-37 mins                     08/02/2021  [name]., PT Acute Rehabilitation Services [phone]  (pager) [phone]  (office)   [name] Ival Basquez 08/02/2021, 5:34 PM

## 2021-08-03 DIAGNOSIS — E11628 Type 2 diabetes mellitus with other skin complications: Secondary | ICD-10-CM | POA: Diagnosis not present

## 2021-08-03 DIAGNOSIS — B951 Streptococcus, group B, as the cause of diseases classified elsewhere: Secondary | ICD-10-CM

## 2021-08-03 DIAGNOSIS — M4644 Discitis, unspecified, thoracic region: Secondary | ICD-10-CM | POA: Diagnosis not present

## 2021-08-03 DIAGNOSIS — R7881 Bacteremia: Secondary | ICD-10-CM

## 2021-08-03 DIAGNOSIS — I4892 Unspecified atrial flutter: Secondary | ICD-10-CM | POA: Diagnosis not present

## 2021-08-03 DIAGNOSIS — Z89511 Acquired absence of right leg below knee: Secondary | ICD-10-CM | POA: Diagnosis not present

## 2021-08-03 DIAGNOSIS — N1831 Chronic kidney disease, stage 3a: Secondary | ICD-10-CM | POA: Diagnosis not present

## 2021-08-03 DIAGNOSIS — N179 Acute kidney failure, unspecified: Secondary | ICD-10-CM | POA: Diagnosis not present

## 2021-08-03 DIAGNOSIS — R6520 Severe sepsis without septic shock: Secondary | ICD-10-CM | POA: Diagnosis not present

## 2021-08-03 DIAGNOSIS — E1142 Type 2 diabetes mellitus with diabetic polyneuropathy: Secondary | ICD-10-CM | POA: Diagnosis not present

## 2021-08-03 DIAGNOSIS — I5032 Chronic diastolic (congestive) heart failure: Secondary | ICD-10-CM | POA: Diagnosis not present

## 2021-08-03 DIAGNOSIS — A419 Sepsis, unspecified organism: Secondary | ICD-10-CM | POA: Diagnosis not present

## 2021-08-03 DIAGNOSIS — B9561 Methicillin susceptible Staphylococcus aureus infection as the cause of diseases classified elsewhere: Secondary | ICD-10-CM

## 2021-08-03 DIAGNOSIS — Z794 Long term (current) use of insulin: Secondary | ICD-10-CM | POA: Diagnosis not present

## 2021-08-03 DIAGNOSIS — A480 Gas gangrene: Secondary | ICD-10-CM | POA: Diagnosis not present

## 2021-08-03 LAB — BASIC METABOLIC PANEL
Anion gap: 6 (ref 5–15)
BUN: 43 mg/dL — ABNORMAL HIGH (ref 6–20)
CO2: 34 mmol/L — ABNORMAL HIGH (ref 22–32)
Calcium: 8.2 mg/dL — ABNORMAL LOW (ref 8.9–10.3)
Chloride: 89 mmol/L — ABNORMAL LOW (ref 98–111)
Creatinine, Ser: 1.81 mg/dL — ABNORMAL HIGH (ref 0.61–1.24)
GFR, Estimated: 44 mL/min — ABNORMAL LOW (ref >60)
Glucose, Bld: 262 mg/dL — ABNORMAL HIGH (ref 70–99)
Potassium: 3.8 mmol/L (ref 3.5–5.1)
Sodium: 129 mmol/L — ABNORMAL LOW (ref 135–145)

## 2021-08-03 LAB — GLUCOSE, CAPILLARY
Glucose-Capillary: 320 mg/dL — ABNORMAL HIGH (ref 70–99)
Glucose-Capillary: 242 mg/dL — ABNORMAL HIGH (ref 70–99)
Glucose-Capillary: 176 mg/dL — ABNORMAL HIGH (ref 70–99)
Glucose-Capillary: 213 mg/dL — ABNORMAL HIGH (ref 70–99)

## 2021-08-03 LAB — MAGNESIUM: Magnesium: 1.8 mg/dL (ref 1.7–2.4)

## 2021-08-03 MED ORDER — OXYCODONE HCL ER 15 MG PO T12A
15.0000 mg | EXTENDED_RELEASE_TABLET | Freq: Two times a day (BID) | ORAL | Status: DC
  Administered 2021-08-03 – 2021-08-28 (×50): 15 mg via ORAL
  Filled 2021-08-03 (×51): qty 1

## 2021-08-03 MED ORDER — OXYCODONE HCL 5 MG PO TABS
5.0000 mg | ORAL_TABLET | Freq: Once | ORAL | Status: AC
  Administered 2021-08-03: 10:00:00 5 mg via ORAL
  Filled 2021-08-03: qty 1

## 2021-08-03 MED ORDER — LORAZEPAM 2 MG/ML IJ SOLN: 1.0000 mg | Freq: Once | INTRAMUSCULAR | Status: DC

## 2021-08-03 MED ORDER — LORAZEPAM 2 MG/ML IJ SOLN
1.0000 mg | Freq: Once | INTRAMUSCULAR | Status: AC | PRN
  Administered 2021-08-05: 1 mg via INTRAVENOUS
  Filled 2021-08-03: qty 1

## 2021-08-03 MED ORDER — TORSEMIDE 20 MG PO TABS
20.0000 mg | ORAL_TABLET | Freq: Every day | ORAL | Status: DC
  Administered 2021-08-03 – 2021-08-07 (×5): 20 mg via ORAL
  Filled 2021-08-03 (×5): qty 1

## 2021-08-03 MED ORDER — INSULIN DETEMIR 100 UNIT/ML ~~LOC~~ SOLN
25.0000 [IU] | Freq: Two times a day (BID) | SUBCUTANEOUS | Status: DC
  Administered 2021-08-03 – 2021-08-06 (×6): 25 [IU] via SUBCUTANEOUS
  Filled 2021-08-03 (×8): qty 0.25

## 2021-08-03 NOTE — Progress Notes (Signed)
Subjective:  Worsening of severe mid and low back pain   Antibiotics:  Anti-infectives (From admission, onward)    Start     Dose/Rate Route Frequency Ordered Stop   08/06/21 0600  amoxicillin (AMOXIL) capsule 1,000 mg        1,000 mg Oral Every 8 hours 07/29/21 1001 09/17/21 0559   07/28/21 1000  penicillin G potassium 12 Million Units in dextrose 5 % 500 mL continuous infusion        12 Million Units 41.7 mL/hr over 12 Hours Intravenous Every 12 hours 07/28/21 0846 08/06/21 1359   07/25/21 1000  penicillin G potassium 8 Million Units in dextrose 5 % 500 mL continuous infusion  Status:  Discontinued        8 Million Units 41.7 mL/hr over 12 Hours Intravenous Every 12 hours 07/25/21 0854 07/28/21 0846   07/23/21 1345  cefTRIAXone (ROCEPHIN) 2 g in sodium chloride 0.9 % 100 mL IVPB  Status:  Discontinued        2 g 200 mL/hr over 30 Minutes Intravenous Every 24 hours 07/23/21 1246 07/25/21 0854   07/23/21 0815  ceFAZolin (ANCEF) IVPB 2g/100 mL premix  Status:  Discontinued        2 g 200 mL/hr over 30 Minutes Intravenous To ShortStay [facility] 07/23/21 0622 07/23/21 0836   07/22/21 2000  linezolid (ZYVOX) IVPB 600 mg  Status:  Discontinued        600 mg 300 mL/hr over 60 Minutes Intravenous Every 12 hours 07/22/21 1544 07/23/21 1349   07/22/21 0800  piperacillin-tazobactam (ZOSYN) IVPB 3.375 g  Status:  Discontinued        3.375 g 12.5 mL/hr over 240 Minutes Intravenous Every 8 hours 07/22/21 0107 07/23/21 1246   07/22/21 0130  clindamycin (CLEOCIN) IVPB 600 mg  Status:  Discontinued        600 mg 100 mL/hr over 30 Minutes Intravenous Every 8 hours 07/22/21 0101 07/22/21 1544   07/22/21 0115  piperacillin-tazobactam (ZOSYN) IVPB 3.375 g        3.375 g 100 mL/hr over 30 Minutes Intravenous STAT 07/22/21 0107 07/22/21 0223   07/21/21 2335  vancomycin variable dose per unstable renal function (pharmacist dosing)  Status:  Discontinued         Does not apply See admin  instructions 07/21/21 2335 07/22/21 1544   07/21/21 2000  vancomycin (VANCOREADY) IVPB 2000 mg/400 mL        2,000 mg 200 mL/hr over 120 Minutes Intravenous  Once 07/21/21 1955 07/22/21 0004   07/21/21 2000  cefTRIAXone (ROCEPHIN) 2 g in sodium chloride 0.9 % 100 mL IVPB        2 g 200 mL/hr over 30 Minutes Intravenous  Once 07/21/21 1955 07/21/21 2130       Medications: Scheduled Meds:  acetaminophen  1,000 mg Oral TID   amiodarone  200 mg Oral BID   [START ON 08/06/2021] amoxicillin  1,000 mg Oral Q8H   apixaban  5 mg Oral BID   vitamin C  1,000 mg Oral Daily   docusate sodium  100 mg Oral Daily   feeding supplement (GLUCERNA SHAKE)  237 mL Oral TID BM   gabapentin  300 mg Oral TID   hydrALAZINE  25 mg Oral Q8H   insulin aspart  0-15 Units Subcutaneous TID WC   insulin aspart  0-5 Units Subcutaneous QHS   insulin aspart  5 Units Subcutaneous TID WC   insulin detemir  23 Units Subcutaneous BID   isosorbide mononitrate  30 mg Oral Daily   lidocaine  2 patch Transdermal Q24H   metoprolol succinate  50 mg Oral Daily   oxyCODONE  15 mg Oral Q12H   polyethylene glycol  17 g Oral Daily   torsemide  20 mg Oral Daily   zinc sulfate  220 mg Oral Daily   Continuous Infusions:  sodium chloride 10 mL/hr at 07/29/21 0816   lactated ringers Stopped (07/23/21 1013)   penicillin g continuous IV infusion 12 Million Units (08/03/21 0254)   PRN Meds:.alum & mag hydroxide-simeth, bisacodyl, diphenhydrAMINE, guaiFENesin-dextromethorphan, hydrALAZINE, HYDROmorphone (DILAUDID) injection, ondansetron, oxyCODONE, phenol, polyethylene glycol, traZODone    Objective: Weight change:   Intake/Output Summary (Last 24 hours) at 08/03/2021 1439 Last data filed at 08/03/2021 0920 Gross per 24 hour  Intake --  Output 1200 ml  Net -1200 ml   Blood pressure (!) 105/53, pulse 64, temperature 98.1 F (36.7 C), temperature source Oral, resp. rate 17, height 6\' 3"  (1.905 m), weight 107 kg, SpO2 97  %. Temp:  [98.1 F (36.7 C)-98.4 F (36.9 C)] 98.1 F (36.7 C) (11/27 1332) Pulse Rate:  [64] 64 (11/27 0538) Resp:  [15-18] 17 (11/27 1332) BP: (102-111)/(53-60) 105/53 (11/27 1332) SpO2:  [97 %] 97 % (11/27 0538)  Physical Exam: Physical Exam Vitals reviewed.  Constitutional:      Appearance: Normal appearance. He is well-developed. He is obese.  HENT:     Head: Normocephalic and atraumatic.  Eyes:     General:        Right eye: No discharge.        Left eye: No discharge.     Conjunctiva/sclera: Conjunctivae normal.  Cardiovascular:     Rate and Rhythm: Normal rate and regular rhythm.  Pulmonary:     Effort: Pulmonary effort is normal. No respiratory distress.     Breath sounds: Normal breath sounds. No stridor. No wheezing.  Abdominal:     General: There is no distension.     Palpations: Abdomen is soft.  Musculoskeletal:     Cervical back: Normal range of motion and neck supple.  Skin:    General: Skin is warm and dry.  Neurological:     General: No focal deficit present.     Mental Status: He is alert and oriented to person, place, and time.  Psychiatric:        Mood and Affect: Mood normal.        Behavior: Behavior normal.        Thought Content: Thought content normal.        Judgment: Judgment normal.      BKA site on left is bandaged  CBC:    BMET Recent Labs    08/02/21 0232 08/03/21 0204  NA 127* 129*  K 3.9 3.8  CL 84* 89*  CO2 35* 34*  GLUCOSE 243* 262*  BUN 38* 43*  CREATININE 1.77* 1.81*  CALCIUM 8.3* 8.2*     Liver Panel  Recent Labs    08/02/21 0232  PROT 5.4*  ALBUMIN <1.5*  AST 33  ALT 33  ALKPHOS 77  BILITOT 0.7  BILIDIR 0.1  IBILI 0.6       Sedimentation Rate No results for input(s): ESRSEDRATE in the last 72 hours. C-Reactive Protein No results for input(s): CRP in the last 72 hours.  Micro Results: Recent Results (from the past 720 hour(s))  Resp Panel by RT-PCR (Flu A&B, Covid) Nasopharyngeal Swab  Status: None   Collection Time: 07/21/21  7:54 PM   Specimen: Nasopharyngeal Swab; Nasopharyngeal(NP) swabs in vial transport medium  Result Value Ref Range Status   SARS Coronavirus 2 by RT PCR NEGATIVE NEGATIVE Final    Comment: (NOTE) SARS-CoV-2 target nucleic acids are NOT DETECTED.  The SARS-CoV-2 RNA is generally detectable in upper respiratory specimens during the acute phase of infection. The lowest concentration of SARS-CoV-2 viral copies this assay can detect is 138 copies/mL. A negative result does not preclude SARS-Cov-2 infection and should not be used as the sole basis for treatment or other patient management decisions. A negative result may occur with  improper specimen collection/handling, submission of specimen other than nasopharyngeal swab, presence of viral mutation(s) within the areas targeted by this assay, and inadequate number of viral copies(<138 copies/mL). A negative result must be combined with clinical observations, patient history, and epidemiological information. The expected result is Negative.  Fact Sheet for Patients:  EntrepreneurPulse.com.au  Fact Sheet for Healthcare Providers:  IncredibleEmployment.be  This test is no t yet approved or cleared by the [place] FDA and  has been authorized for detection and/or diagnosis of SARS-CoV-2 by FDA under an Emergency Use Authorization (EUA). This EUA will remain  in effect (meaning this test can be used) for the duration of the COVID-19 declaration under Section 564(b)(1) of the Act, 21 U.S.C.section 360bbb-3(b)(1), unless the authorization is terminated  or revoked sooner.       Influenza A by PCR NEGATIVE NEGATIVE Final   Influenza B by PCR NEGATIVE NEGATIVE Final    Comment: (NOTE) The Xpert Xpress SARS-CoV-2/FLU/RSV plus assay is intended as an aid in the diagnosis of influenza from Nasopharyngeal swab specimens and should not be used as a sole basis  for treatment. Nasal washings and aspirates are unacceptable for Xpert Xpress SARS-CoV-2/FLU/RSV testing.  Fact Sheet for Patients: EntrepreneurPulse.com.au  Fact Sheet for Healthcare Providers: IncredibleEmployment.be  This test is not yet approved or cleared by the [place] FDA and has been authorized for detection and/or diagnosis of SARS-CoV-2 by FDA under an Emergency Use Authorization (EUA). This EUA will remain in effect (meaning this test can be used) for the duration of the COVID-19 declaration under Section 564(b)(1) of the Act, 21 U.S.C. section 360bbb-3(b)(1), unless the authorization is terminated or revoked.  Performed at [facility], [place] [address] [place], [place] [address]   Culture, blood (Routine x 2)     Status: Abnormal   Collection Time: 07/21/21  8:00 PM   Specimen: BLOOD  Result Value Ref Range Status   Specimen Description BLOOD RIGHT ANTECUBITAL  Final   Special Requests   Final    BOTTLES DRAWN AEROBIC AND ANAEROBIC Blood Culture adequate volume   Culture  Setup Time   Final    GRAM POSITIVE COCCI IN CHAINS IN BOTH AEROBIC AND ANAEROBIC BOTTLES    Culture (A)  Final    GROUP B STREP(S.AGALACTIAE)ISOLATED SUSCEPTIBILITIES PERFORMED ON PREVIOUS CULTURE WITHIN THE LAST 5 DAYS. Performed at [facility], [place] [address] [place], [place] [address]    Report Status 07/25/2021 FINAL  Final  Culture, blood (Routine x 2)     Status: Abnormal   Collection Time: 07/21/21  8:03 PM   Specimen: BLOOD  Result Value Ref Range Status   Specimen Description BLOOD LEFT ARM  Final   Special Requests   Final    BOTTLES DRAWN AEROBIC AND ANAEROBIC Blood Culture adequate volume   Culture  Setup Time   Final    GRAM POSITIVE COCCI IN CHAINS IN BOTH AEROBIC AND ANAEROBIC BOTTLES CRITICAL RESULT CALLED TO, READ BACK BY AND VERIFIED WITH: PHARMD B BOLDEN [id number] AT 830 AM BY CM Performed at [facility],  [place] [address] [place], [place] [address]    Culture GROUP B STREP(S.AGALACTIAE)ISOLATED (A)  Final   Report Status 07/25/2021 FINAL  Final   Organism ID, Bacteria GROUP B STREP(S.AGALACTIAE)ISOLATED  Final      Susceptibility   Group b strep(s.agalactiae)isolated - MIC*    CLINDAMYCIN <=0.25 SENSITIVE Sensitive     AMPICILLIN <=0.25 SENSITIVE Sensitive     ERYTHROMYCIN <=0.12 SENSITIVE Sensitive     VANCOMYCIN 0.5 SENSITIVE Sensitive     CEFTRIAXONE <=0.12 SENSITIVE Sensitive     LEVOFLOXACIN 0.5 SENSITIVE Sensitive     * GROUP B STREP(S.AGALACTIAE)ISOLATED  Blood Culture ID Panel (Reflexed)     Status: Abnormal   Collection Time: 07/21/21  8:03 PM  Result Value Ref Range Status   Enterococcus faecalis NOT DETECTED NOT DETECTED Final   Enterococcus Faecium NOT DETECTED NOT DETECTED Final   Listeria monocytogenes NOT DETECTED NOT DETECTED Final   Staphylococcus species NOT DETECTED NOT DETECTED Final   Staphylococcus aureus (BCID) NOT DETECTED NOT DETECTED Final   Staphylococcus epidermidis NOT DETECTED NOT DETECTED Final   Staphylococcus lugdunensis NOT DETECTED NOT DETECTED Final   Streptococcus species DETECTED (A) NOT DETECTED Final    Comment: CRITICAL RESULT CALLED TO, READ BACK BY AND VERIFIED WITH: PHARMD B BOLDEN [id number] AT 923 AM BY CM    Streptococcus agalactiae DETECTED (A) NOT DETECTED Final    Comment: CRITICAL RESULT CALLED TO, READ BACK BY AND VERIFIED WITH: PHARMD B BOLDEN [id number] AT 923 AM BY CM    Streptococcus pneumoniae NOT DETECTED NOT DETECTED Final   Streptococcus pyogenes NOT DETECTED NOT DETECTED Final   A.calcoaceticus-baumannii NOT DETECTED NOT DETECTED Final   Bacteroides fragilis NOT DETECTED NOT DETECTED Final   Enterobacterales NOT DETECTED NOT DETECTED Final   Enterobacter cloacae complex NOT DETECTED NOT DETECTED Final   Escherichia coli NOT DETECTED NOT DETECTED Final   Klebsiella aerogenes NOT DETECTED NOT DETECTED Final   Klebsiella oxytoca NOT  DETECTED NOT DETECTED Final   Klebsiella pneumoniae NOT DETECTED NOT DETECTED Final   Proteus species NOT DETECTED NOT DETECTED Final   Salmonella species NOT DETECTED NOT DETECTED Final   Serratia marcescens NOT DETECTED NOT DETECTED Final   Haemophilus influenzae NOT DETECTED NOT DETECTED Final   Neisseria meningitidis NOT DETECTED NOT DETECTED Final   Pseudomonas aeruginosa NOT DETECTED NOT DETECTED Final   Stenotrophomonas maltophilia NOT DETECTED NOT DETECTED Final   Candida albicans NOT DETECTED NOT DETECTED Final   Candida auris NOT DETECTED NOT DETECTED Final   Candida glabrata NOT DETECTED NOT DETECTED Final   Candida krusei NOT DETECTED NOT DETECTED Final   Candida parapsilosis NOT DETECTED NOT DETECTED Final   Candida tropicalis NOT DETECTED NOT DETECTED Final   Cryptococcus neoformans/gattii NOT DETECTED NOT DETECTED Final    Comment: Performed at [facility], [place]. [address], [place], [place] [address]  Urine Culture     Status: Abnormal   Collection Time: 07/22/21  3:26 AM   Specimen: In/Out Cath Urine  Result Value Ref Range Status   Specimen Description IN/OUT CATH URINE  Final   Special Requests NONE  Final   Culture (A)  Final    30,000 COLONIES/mL STREPTOCOCCUS AGALACTIAE TESTING AGAINST S. AGALACTIAE NOT  ROUTINELY PERFORMED DUE TO PREDICTABILITY OF AMP/PEN/VAN SUSCEPTIBILITY. Performed at [facility], [place] [address], [place], [place] [address]    Report Status 07/23/2021 FINAL  Final  MRSA Next Gen by PCR, Nasal     Status: Abnormal   Collection Time: 07/22/21  5:02 PM   Specimen: Nasal Mucosa; Nasal Swab  Result Value Ref Range Status   MRSA by PCR Next Gen DETECTED (A) NOT DETECTED Final    Comment: RESULT CALLED TO, READ BACK BY AND VERIFIED WITH: ASHLEY ORE RN 07/22/2021 @2051  BY JW (NOTE) The GeneXpert MRSA Assay (FDA approved for NASAL specimens only), is one component of a comprehensive MRSA colonization surveillance program. It is not  intended to diagnose MRSA infection nor to guide or monitor treatment for MRSA infections. Test performance is not FDA approved in patients less than 67 years old. Performed at [facility], [place] [address], [place], [place] [address]   Surgical pcr screen     Status: Abnormal   Collection Time: 07/22/21  8:53 PM   Specimen: Nasal Mucosa; Nasal Swab  Result Value Ref Range Status   MRSA, PCR POSITIVE (A) NEGATIVE Final    Comment: RESULT CALLED TO, READ BACK BY AND VERIFIED WITH: A ORR,RN@2230  07/22/21 [place]    Staphylococcus aureus POSITIVE (A) NEGATIVE Final    Comment: (NOTE) The Xpert SA Assay (FDA approved for NASAL specimens in patients 29 years of age and older), is one component of a comprehensive surveillance program. It is not intended to diagnose infection nor to guide or monitor treatment. Performed at [facility], [place] [address], [place], [place] [address]   Culture, blood (routine x 2)     Status: None   Collection Time: 07/24/21 12:02 AM   Specimen: BLOOD  Result Value Ref Range Status   Specimen Description BLOOD RIGHT ARM  Final   Special Requests   Final    BOTTLES DRAWN AEROBIC AND ANAEROBIC Blood Culture adequate volume   Culture   Final    NO GROWTH 5 DAYS Performed at [facility], [address] [place], [place] [address]    Report Status 07/29/2021 FINAL  Final  Culture, blood (routine x 2)     Status: None   Collection Time: 07/24/21 12:02 AM   Specimen: BLOOD  Result Value Ref Range Status   Specimen Description BLOOD RIGHT ARM  Final   Special Requests   Final    BOTTLES DRAWN AEROBIC AND ANAEROBIC Blood Culture adequate volume   Culture   Final    NO GROWTH 5 DAYS Performed at [facility], [address], [place], [place] [address]    Report Status 07/29/2021 FINAL  Final    Studies/Results: No results found.    Assessment/Plan:  INTERVAL HISTORY: Significant worsening of back pain   Principal Problem:    Severe sepsis with acute organ dysfunction (HCC) Active Problems:   Essential hypertension   Stage 3a chronic kidney disease (HCC)   Type 2 diabetes mellitus with diabetic polyneuropathy, with long-term current use of insulin (HCC)   Right below-knee amputee (HCC)   Diabetic infection of left foot (HCC)   Chronic diastolic (congestive) heart failure (HCC)   Chest pain   AKI (acute kidney injury) ([place])   Gas gangrene of foot ([place])   Atrial flutter ([place])   Septic shock ([place])   Systolic dysfunction   Pressure injury of skin    [name] is a 55 y.o.  male with poorly controlled diabetes mellitus with A1c of 13.8 he was had a right below the knee amputation performed in [place] was admitted with diabetic foot ulcer with osteomyelitis that ultimately required a below the knee amputation.  Patient's infection had some progressed the point where he actually was bacteremic with group B streptococcus.  Additionally he had thoracic spine vertebral osteomyelitis and discitis at T6-2 T8 undoubtedly due to hematogenous spread of his group B streptococcus to the spine.  He was found to have noninfectious pathology at his lumbar spine on imaging  Patient was evaluated by my partner Dr. Candiss Norse.  TEE was performed and did not show evidence of endocarditis.  Initial plan was to treat the patient with 8 weeks of antibiotics but to do so with 2 weeks of parenteral therapy completing antibiotics on the 29th and then switching over to oral antibiotics to complete a total of 8 weeks.  Unfortunately he has had abrupt worsening in his mid and lower back pain.  This was noticed when he tried to get out of bed to participate in physical therapy.  MRI of the thoracic and lumbar spine without contrast is pending.  Unfortunately the patient is not confident he will be able to undergo an MRI because he is not sure he can even move off of his bed at this point in time.  Certainly if MRI is not something that can  be done with pain control and anxiolytics he potentially might need general anesthesia to obtain a study.  I find his worsening back pain disconcerting and I definitely believe he needs repeat imaging.  I would also favor treating him with parenteral antibiotics for 6 to 8 weeks.  Dr. Candiss Norse who followed him previously will be back tomorrow and I will likely have her see the patient since she knows him so well.  I spent 40 minutes with the patient including face to face counseling of the patient guarding his below the knee amputation his group B streptococcal bacteremia with seeding of his thoracic spine is worsening pain concerning for worsening infection at that site personally reviewing his prior MRI of the thoracic and lumbar spine his blood cultures TEE CBC CMP sed rate CRP along with review of medical records before and during the visit and in coordination of his care.    LOS: 12 days   Alcide Evener 08/03/2021, 2:39 PM

## 2021-08-03 NOTE — Progress Notes (Signed)
Inpatient Diabetes Program Recommendations  AACE/ADA: New Consensus Statement on Inpatient Glycemic Control (2015)  Target Ranges:  Prepandial:   less than 140 mg/dL      Peak postprandial:   less than 180 mg/dL (1-2 hours)      Critically ill patients:  140 - 180 mg/dL   Lab Results  Component Value Date   GLUCAP 242 (H) 08/03/2021   HGBA1C 13.8 (H) 07/22/2021    Review of Glycemic Control  Latest Reference Range & Units 08/02/21 22:18 08/03/21 07:58 08/03/21 11:22  Glucose-Capillary 70 - 99 mg/dL 278 (H) 320 (H) 242 (H)    Inpatient Diabetes Program Recommendations:   Please consider: -Increase Levemir to 25 units bid -Increase Novolog meal coverage to 6 units tid if eats 50% Secure chat sent to Dr. Algis Liming.  Thank you, Nani Gasser. Mima Cranmore, RN, MSN, CDE  Diabetes Coordinator Inpatient Glycemic Control Team Team Pager [phone] (8am-5pm) 08/03/2021 1:23 PM

## 2021-08-03 NOTE — Progress Notes (Signed)
Cardiology Progress Note  [name] MRN: [id number] DOB: 10-04-1965 Date of Encounter: 08/03/2021  Primary Cardiologist: Sanda Klein, MD  Subjective   Chief Complaint: Back pain  HPI: Reports worsening back pain.  Volume status is improving.  Kidney function likely close to baseline.  Denies chest pain or trouble breathing.  Remains in normal sinus rhythm.  ROS:  All other ROS reviewed and negative. Pertinent positives noted in the HPI.     Inpatient Medications  Scheduled Meds:  acetaminophen  1,000 mg Oral TID   amiodarone  200 mg Oral BID   [START ON 08/06/2021] amoxicillin  1,000 mg Oral Q8H   apixaban  5 mg Oral BID   vitamin C  1,000 mg Oral Daily   docusate sodium  100 mg Oral Daily   feeding supplement (GLUCERNA SHAKE)  237 mL Oral TID BM   gabapentin  300 mg Oral TID   hydrALAZINE  25 mg Oral Q8H   insulin aspart  0-15 Units Subcutaneous TID WC   insulin aspart  0-5 Units Subcutaneous QHS   insulin aspart  5 Units Subcutaneous TID WC   insulin detemir  23 Units Subcutaneous BID   isosorbide mononitrate  30 mg Oral Daily   lidocaine  2 patch Transdermal Q24H   metoprolol succinate  50 mg Oral Daily   oxyCODONE  10 mg Oral Q12H   polyethylene glycol  17 g Oral Daily   zinc sulfate  220 mg Oral Daily   Continuous Infusions:  sodium chloride 10 mL/hr at 07/29/21 0816   lactated ringers Stopped (07/23/21 1013)   penicillin g continuous IV infusion 12 Million Units (08/03/21 0254)   PRN Meds: alum & mag hydroxide-simeth, bisacodyl, diphenhydrAMINE, guaiFENesin-dextromethorphan, hydrALAZINE, HYDROmorphone (DILAUDID) injection, ondansetron, oxyCODONE, phenol, polyethylene glycol, traZODone   Vital Signs   Vitals:   08/02/21 1115 08/02/21 1420 08/02/21 1620 08/03/21 0538  BP: 115/65  102/60 (!) 111/58  Pulse:    64  Resp: 16 20 18 15   Temp: 98 F (36.7 C)  98.4 F (36.9 C) 98.4 F (36.9 C)  TempSrc:   Oral Oral  SpO2:    97%  Weight:       Height:        Intake/Output Summary (Last 24 hours) at 08/03/2021 0827 Last data filed at 08/02/2021 1334 Gross per 24 hour  Intake --  Output 900 ml  Net -900 ml   Last 3 Weights 08/01/2021 07/29/2021 07/27/2021  Weight (lbs) 235 lb 14.3 oz 238 lb 8.6 oz 238 lb 8.6 oz  Weight (kg) 107 kg 108.2 kg 108.2 kg      Telemetry  Overnight telemetry shows sinus rhythm in the 60s, which I personally reviewed.   Physical Exam   Vitals:   08/02/21 1115 08/02/21 1420 08/02/21 1620 08/03/21 0538  BP: 115/65  102/60 (!) 111/58  Pulse:    64  Resp: 16 20 18 15   Temp: 98 F (36.7 C)  98.4 F (36.9 C) 98.4 F (36.9 C)  TempSrc:   Oral Oral  SpO2:    97%  Weight:      Height:        Intake/Output Summary (Last 24 hours) at 08/03/2021 0827 Last data filed at 08/02/2021 1334 Gross per 24 hour  Intake --  Output 900 ml  Net -900 ml    Last 3 Weights 08/01/2021 07/29/2021 07/27/2021  Weight (lbs) 235 lb 14.3 oz 238 lb 8.6 oz 238 lb 8.6 oz  Weight (kg)  107 kg 108.2 kg 108.2 kg    Body mass index is 29.48 kg/m.   General: Well nourished, well developed, in no acute distress Head: Atraumatic, normal size  Eyes: PEERLA, EOMI  Neck: Supple, JVD 7 8 cm of water Endocrine: No thryomegaly Cardiac: Normal S1, S2; RRR; no murmurs, rubs, or gallops Lungs: Diminished breath sounds bilaterally Abd: Soft, nontender, no hepatomegaly  Ext: Status post bilateral leg amputation, scrotal edema noted (improving) Skin: Warm and dry, no rashes   Neuro: Alert and oriented to person, place, time, and situation, CNII-XII grossly intact, no focal deficits  Psych: Normal mood and affect   Labs  High Sensitivity Troponin:   Recent Labs  Lab 07/22/21 0246 07/22/21 0645  TROPONINIHS 52* 44*     Cardiac EnzymesNo results for input(s): TROPONINI in the last 168 hours. No results for input(s): TROPIPOC in the last 168 hours.  Chemistry Recent Labs  Lab 08/01/21 0259 08/02/21 0232 08/03/21 0204   NA 129* 127* 129*  K 4.2 3.9 3.8  CL 89* 84* 89*  CO2 33* 35* 34*  GLUCOSE 286* 243* 262*  BUN 29* 38* 43*  CREATININE 1.52* 1.77* 1.81*  CALCIUM 8.1* 8.3* 8.2*  PROT  --  5.4*  --   ALBUMIN  --  <1.5*  --   AST  --  33  --   ALT  --  33  --   ALKPHOS  --  77  --   BILITOT  --  0.7  --   GFRNONAA 54* 45* 44*  ANIONGAP 7 8 6     Hematology Recent Labs  Lab 07/30/21 0144 07/31/21 0130 08/01/21 0259  WBC 15.0* 14.8* 13.9*  RBC 4.74 4.88 4.82  HGB 13.6 14.0 13.5  HCT 40.2 41.4 41.5  MCV 84.8 84.8 86.1  MCH 28.7 28.7 28.0  MCHC 33.8 33.8 32.5  RDW 15.6* 15.5 15.2  PLT 297 318 349   BNPNo results for input(s): BNP, PROBNP in the last 168 hours.  DDimer No results for input(s): DDIMER in the last 168 hours.   Radiology  No results found.  Cardiac Studies  TTE 07/23/2021   1. Left ventricular ejection fraction, by estimation, is 35%. The left  ventricle has moderately decreased function. The left ventricle  demonstrates global hypokinesis. There is mild left ventricular  hypertrophy. Left ventricular diastolic parameters are   indeterminate.   2. Right ventricular systolic function is moderately reduced. The right  ventricular size is normal. There is moderately elevated pulmonary artery  systolic pressure. The estimated right ventricular systolic pressure is  54.0 mmHg.   3. Left atrial size was mildly dilated.   4. Right atrial size was mildly dilated.   5. The mitral valve is normal in structure. Trivial mitral valve  regurgitation. No evidence of mitral stenosis.   6. The aortic valve is tricuspid. Aortic valve regurgitation is not  visualized. No aortic stenosis is present.   7. Aortic dilatation noted. There is mild dilatation of the aortic root,  measuring 39 mm.   8. The inferior vena cava is dilated in size with <50% respiratory  variability, suggesting right atrial pressure of 15 mmHg.   9. No vegetation visualized, but if high suspicion would need TEE.   10. The patient was in atrial fibrillation.   Patient Profile  [name] is a 55 y.o. male with heart failure with preserved ejection fraction, uncontrolled diabetes, CKD stage III who was admitted on 07/22/2021 with sepsis secondary to left lower  extremity osteomyelitis.  Cardiology was consulted due to new onset atrial flutter as well as new onset systolic heart failure.  Assessment & Plan   #New onset systolic heart failure, EF 35% -Initially admitted to the ICU with sepsis due to diabetic foot infection of the left lower extremity.  He is status post left transtibial amputation.  Blood cultures were positive for Streptococcus organism. -Cardiology was consulted as he was in atrial flutter.  He was also found to have a reduced EF of 35%. -EF drop was attributed to arrhythmia versus sepsis. -Underwent TEE/cardioversion on 07/29/2021.  No signs of endocarditis either. -Currently maintaining sinus rhythm on amiodarone 200 mg twice daily.  Would complete 7 days of 200 mg twice daily and then transition to 200 mg daily. -For his cardiomyopathy he is approaching euvolemia.  He is net -900 cc overnight.  Net -14.2 L since admission. -Kidney function is close to baseline.  His creatinine baseline is around 1.5.  1.8 this morning.  I have added torsemide 20 mg daily.  His edema continues to improve. -Not a candidate for ACE or/ARB/Arni/MRA given significant kidney dysfunction.  Continue metoprolol succinate 50 mg daily.  We added hydralazine 25 mg 3 times daily and Imdur 30 mg daily yesterday. -Overall given the fact that he is here with sepsis and bacteremia as well as recent leg amputation would recommend medical management of his cardiomyopathy.  Suspect he has some underlying CAD but for now would recommend medical therapy.  #New onset atrial flutter status post TEE/cardioversion -Status post TEE/cardioversion 07/29/2021 -Continue amiodarone as above. -On Eliquis 5 mg twice daily. -We will  need to have Eliquis uninterrupted for at least 1 month after cardioversion.  #Sepsis/GBS bacteremia -on Abx -no signs of endocarditis    For questions or updates, please contact [place] Please consult [email or web address] for contact info under   Time Spent with Patient: I have spent a total of 25 minutes with patient reviewing hospital notes, telemetry, EKGs, labs and examining the patient as well as establishing an assessment and plan that was discussed with the patient.  > 50% of time was spent in direct patient care.    Signed, Addison Naegeli. Audie Box, MD, [place]  08/03/2021 8:27 AM

## 2021-08-03 NOTE — Progress Notes (Signed)
[name], male   DOB: [birth year], 55 y.o.   MRN: [id number]  PROGRESS NOTE    [name]  PNT:[id number] DOB: 1966/03/08 DOA: 07/21/2021 PCP: Patient, No Pcp Per (Inactive)   Brief Narrative:  55 year old male with history of diabetes mellitus type 2, diabetic foot infection with prior right BKA, chronic diastolic CHF, hypertension, dyslipidemia presented with worsening left foot swelling/discoloration and discharge.  On presentation, he was hypotensive, tachycardic with A. fib with RVR with CT suggestive of first MTP osteomyelitis and soft tissue gas concerning for possible necrotizing fasciitis along with AKI with creatinine of 2.7 and mild DKA.  His blood pressures did not improve with IV fluids and he was transferred to ICU and PCCM was consulted.  Orthopedics was consulted.  He underwent left transtibial amputation on 07/23/2021.  He was found to have strep agalactiae bacteremia.  ID was consulted.  He was found to have vertebral discitis versus osteomyelitis on MRI.  He was also found to have new systolic dysfunction with LVEF of 35% along with new onset atrial flutter requiring IV amiodarone which has been switched to oral.  Cardiology was consulted.  He has been started on Eliquis.  S/p TEE followed by DCCV on 11/22.  Developed worsening back pain 11/26-11/27, ID reconsulted, requested MRI T/L-spine without contrast (contrast precluded by elevated creatinine).  Assessment & Plan:   Septic shock: Evolving on admission from bacteremia/diabetic foot infection -Off pressors.  Shock resolved. -Required care in ICU under PCCM service: Care has been transferred to [facility] from 07/27/2021 onwards  Diabetic foot infection, left -Status post left transtibial amputation on 07/23/2021.  Wound/wound VAC care as per Dr. Sharol Given.  Recommend having Dr. Sharol Given follow-up early next week while patient still hospitalized.  Group B streptococcus bacteremia, Possible thoracic  discitis/osteomyelitis -TTE showed no vegetation.   -MRI of cervical/thoracic/lumbar spine showed possible thoracic osteomyelitis/discitis without evidence of abscess but MRI was without contrast.   - Neurosurgery evaluation appreciated: Not convinced that patient has epidural abscess.  Recommended to manage conservatively with IV antibiotics. -Antibiotics have been changed to penicillin.  Patient will probably need 8 weeks of antibiotics per ID (2 weeks of parenteral therapy with penicillin from 07/23/2021-08/05/2021; then transition to amoxicillin 1 g 3 times daily till 09/14/2021). -Repeat blood cultures from 07/24/2021 were negative -TEE 11/22 without evidence of vegetation. -Patient developed worsening of back pain when attempting to sit up with PT yesterday, ID was reconsulted, discussed with Dr. Tommy Medal, his consult note appreciated-likely will need 6 to 8 weeks of parenteral antibiotics, await MRI of T/L-spine which has been ordered but if patient unable to perform with pain control and anxiolytics, might need to be done under GA.  ID will follow.  AKI on CKD stage IV, Acute metabolic acidosis -Baseline creatinine may be in the 1.4-1.5 range. -Presented with creatinine of 2.76.  Creatinine peaked to 3.29. -Creatinine gradually improving, plateaued in the 1.4 and then has gradually increased to 1.81 today. -AKI secondary to aggressive diuresis at this time.  Held Lasix.  Cardiology have started torsemide 20 Mg daily.  Follow BMP. -Acidosis resolved. -Avoid nephrotoxins.  Hyponatremia -Corrected serum sodium to hyperglycemia is >130  New onset systolic CHF -Echo showed EF of 35% (previous echo showed EF of 50 to 55% in 04/2021) -Cardiomyopathy may be from new onset atrial flutter RVR related tachycardia versus stress induced cardiomyopathy from bacteremia and septic shock. -Cardiology closely managing and adjusting meds.  Was on IV Lasix 80 mg twice daily and  metolazone 2.5 Mg daily but  creatinine steadily rising and hence held Lasix 11/26.  -10.1 L thus far.  Volume status is significantly improved.  Also creatinine gradually rising.  Cardiology has started torsemide 20 Mg daily.   - Continuing Toprol-XL 50 Mg daily, hydralazine 25 Mg 3 times daily and Imdur 30 mg daily.  No ACEI/ARB/ARN I/MRA due to AKI.  New onset atrial flutter -Continue Toprol-XL 50 Mg daily. -IV amiodarone now switched to 200 Mg twice daily x7 days and then 200 Mg daily. -Continue Eliquis 5 Mg twice daily -S/p TEE guided DCCV on 11/22 and currently in sinus rhythm. -Follow-up 2D echo in a couple of months. -Cardiology following.  Remains in sinus rhythm.  Hypomagnesemia -Persistent despite aggressive replacement.  Finally replaced.  Follow periodically.  Leukocytosis -Has gradually improved.  Follow CBC in AM.  Diabetes mellitus type 2 with hyperglycemia -Markedly uncontrolled few days ago.  Multiple insulin adjustments have been done since then.  Improved control but still widely fluctuating. - Diabetes coordinator input appreciated.  Increased Levemir to 25 units twice daily, for now continue NovoLog 5 units 3 times daily with meals and SSI.  Further titrate insulins as needed. -Counseled patient regarding importance of dietary restrictions.  Thrombocytopenia -Resolved  Generalized deconditioning -PT recommends CIR.  A consult to CIR. as per TOC, patient's insurance declined CIR admission and plan to do peer to peer with insurance medical director when arranged by [facility]. - As per [facility], peer to peer cannot be done until Monday 11/28.  Advised that this will have to be arranged with the provider taking care of the patient on that day.  Acute on chronic pain -Follows with Dr. Herma Mering, pain management.  Has received steroid injections to the back.  Patient would like MD on 11/28 to try and reach out to his pain MD for management suggestions -Reviewed [place] PDMP and patient regularly fills opioid  prescriptions.  Likely opioid dependent. - Obviously has reasons for acute pain from recent surgery, discitis/osteomyelitis. -Initiated OxyContin 10 Mg every 12 hours on 11/23.  This was increased to 15 mg every 12 hours on 12/27 - Evaluating etiology for worsening back pain as noted above.  Patient on following regimen after making changes: - Tylenol 1 g 3 times daily. - Oxy IR 10 mg every 4 hourly as needed for moderate pain - OxyContin 15 Mg every 12 hours - Lidocaine patch increased to 2 patches - Dilaudid 0.5 mg IV every 4 hourly as needed severe pain - Gabapentin 300 mg 3 times daily  Insomnia - Started trazodone 50 Mg at bedtime as needed on 11/24 with good effect, continue.  DVT prophylaxis: Apixaban Code Status: Full Family Communication: None at bedside Disposition Plan: Status is: Inpatient  Remains inpatient appropriate because: IV Lasix and further diuresis for decompensated CHF.  Consultants: Cardiology/orthopedic/ID/neurosurgery/PCCM  Procedures: Left transtibial amputation on 07/23/2021.  TEE and DCCV 11/22.  Antimicrobials:  Anti-infectives (From admission, onward)    Start     Dose/Rate Route Frequency Ordered Stop   08/06/21 0600  amoxicillin (AMOXIL) capsule 1,000 mg        1,000 mg Oral Every 8 hours 07/29/21 1001 09/17/21 0559   07/28/21 1000  penicillin G potassium 12 Million Units in dextrose 5 % 500 mL continuous infusion        12 Million Units 41.7 mL/hr over 12 Hours Intravenous Every 12 hours 07/28/21 0846 08/06/21 1359   07/25/21 1000  penicillin G potassium 8 Million Units in dextrose  5 % 500 mL continuous infusion  Status:  Discontinued        8 Million Units 41.7 mL/hr over 12 Hours Intravenous Every 12 hours 07/25/21 0854 07/28/21 0846   07/23/21 1345  cefTRIAXone (ROCEPHIN) 2 g in sodium chloride 0.9 % 100 mL IVPB  Status:  Discontinued        2 g 200 mL/hr over 30 Minutes Intravenous Every 24 hours 07/23/21 1246 07/25/21 0854   07/23/21  0815  ceFAZolin (ANCEF) IVPB 2g/100 mL premix  Status:  Discontinued        2 g 200 mL/hr over 30 Minutes Intravenous To [facility]Stay [facility] 07/23/21 0622 07/23/21 0836   07/22/21 2000  linezolid (ZYVOX) IVPB 600 mg  Status:  Discontinued        600 mg 300 mL/hr over 60 Minutes Intravenous Every 12 hours 07/22/21 1544 07/23/21 1349   07/22/21 0800  piperacillin-tazobactam (ZOSYN) IVPB 3.375 g  Status:  Discontinued        3.375 g 12.5 mL/hr over 240 Minutes Intravenous Every 8 hours 07/22/21 0107 07/23/21 1246   07/22/21 0130  clindamycin (CLEOCIN) IVPB 600 mg  Status:  Discontinued        600 mg 100 mL/hr over 30 Minutes Intravenous Every 8 hours 07/22/21 0101 07/22/21 1544   07/22/21 0115  piperacillin-tazobactam (ZOSYN) IVPB 3.375 g        3.375 g 100 mL/hr over 30 Minutes Intravenous STAT 07/22/21 0107 07/22/21 0223   07/21/21 2335  vancomycin variable dose per unstable renal function (pharmacist dosing)  Status:  Discontinued         Does not apply See admin instructions 07/21/21 2335 07/22/21 1544   07/21/21 2000  vancomycin (VANCOREADY) IVPB 2000 mg/400 mL        2,000 mg 200 mL/hr over 120 Minutes Intravenous  Once 07/21/21 1955 07/22/21 0004   07/21/21 2000  cefTRIAXone (ROCEPHIN) 2 g in sodium chloride 0.9 % 100 mL IVPB        2 g 200 mL/hr over 30 Minutes Intravenous  Once 07/21/21 1955 07/21/21 2130        Subjective: Patient developed worsening of back pain when attempting to sit up with PT yesterday.  Indicates that it is in the mid back, both thoracic and lumbar.  Stating that he cannot even move in bed and having pain in his bilateral upper thighs and difficulty moving them.  No tingling or numbness reported.  Wanted to know why IV antibiotics could not be switched to vancomycin which has worked well for him in the past.  Objective: Vitals:   08/02/21 1620 08/03/21 0538 08/03/21 1332 08/03/21 1604  BP: 102/60 (!) 111/58 (!) 105/53 116/61  Pulse:  64  63  Resp: 18 15  17 11   Temp: 98.4 F (36.9 C) 98.4 F (36.9 C) 98.1 F (36.7 C) 98 F (36.7 C)  TempSrc: Oral Oral Oral Oral  SpO2:  97%    Weight:      Height:        Intake/Output Summary (Last 24 hours) at 08/03/2021 1649 Last data filed at 08/03/2021 1641 Gross per 24 hour  Intake --  Output 1900 ml  Net -1900 ml   Filed Weights   07/27/21 0252 07/29/21 0754 08/01/21 0459  Weight: 108.2 kg 108.2 kg 107 kg    Examination:  General exam: Young male, moderately built and overweight lying propped up in bed, appeared uncomfortable and in painful distress. Respiratory system: Clear to auscultation.  No increased work of breathing. cardiovascular system: S1 and S2 heard, RRR.  No JVD or murmurs.  No bilateral thigh edema.  Scrotal edema has also decreased.  Telemetry personally reviewed: Sinus rhythm. gastrointestinal system: Abdomen is mildly distended, soft and nontender.  Normal bowel sounds heard  extremities: Healed right BKA stump-has stump shrinker.  Left transtibial amputation site with wound VAC in place and on top-has a stump shrinker.  Appears to have normal power in his lower extremities/thighs but seems to be limited secondary to pain and also patient does not appear to be giving adequate effort. Central nervous system: Alert and oriented.  No focal neurological deficits noted  skin: No obvious ecchymosis/lesions  psychiatry: Anxious.  Data Reviewed: I have personally reviewed following labs and imaging studies  CBC: Recent Labs  Lab 07/28/21 0055 07/29/21 0210 07/30/21 0144 07/31/21 0130 08/01/21 0259  WBC 17.1* 15.4* 15.0* 14.8* 13.9*  HGB 14.1 14.0 13.6 14.0 13.5  HCT 42.2 43.1 40.2 41.4 41.5  MCV 83.2 85.9 84.8 84.8 86.1  PLT 223 280 297 318 601   Basic Metabolic Panel: Recent Labs  Lab 07/30/21 0144 07/31/21 0130 08/01/21 0259 08/01/21 1634 08/02/21 0232 08/03/21 0204  NA 128* 128* 129*  --  127* 129*  K 4.7 4.3 4.2  --  3.9 3.8  CL 96* 91* 89*  --  84*  89*  CO2 25 29 33*  --  35* 34*  GLUCOSE 359* 282* 286*  --  243* 262*  BUN 34* 28* 29*  --  38* 43*  CREATININE 1.43* 1.40* 1.52*  --  1.77* 1.81*  CALCIUM 7.9* 8.0* 8.1*  --  8.3* 8.2*  MG 1.6* 1.6* 1.5* 1.9 1.7 1.8   GFR: Estimated Creatinine Clearance: 61 mL/min (A) (by C-G formula based on SCr of 1.81 mg/dL (H)). Liver Function Tests: Recent Labs  Lab 08/02/21 0232  AST 33  ALT 33  ALKPHOS 77  BILITOT 0.7  PROT 5.4*  ALBUMIN <1.5*     Coagulation Profile: Recent Labs  Lab 07/28/21 1557  INR 1.6*      CBG: Recent Labs  Lab 08/02/21 1944 08/02/21 2218 08/03/21 0758 08/03/21 1122 08/03/21 1603  GLUCAP 161* 278* 320* 242* 176*    No results found for this or any previous visit (from the past 240 hour(s)).         Radiology Studies: No results found.      Scheduled Meds:  acetaminophen  1,000 mg Oral TID   amiodarone  200 mg Oral BID   [START ON 08/06/2021] amoxicillin  1,000 mg Oral Q8H   apixaban  5 mg Oral BID   vitamin C  1,000 mg Oral Daily   docusate sodium  100 mg Oral Daily   feeding supplement (GLUCERNA SHAKE)  237 mL Oral TID BM   gabapentin  300 mg Oral TID   hydrALAZINE  25 mg Oral Q8H   insulin aspart  0-15 Units Subcutaneous TID WC   insulin aspart  0-5 Units Subcutaneous QHS   insulin aspart  5 Units Subcutaneous TID WC   insulin detemir  25 Units Subcutaneous BID   isosorbide mononitrate  30 mg Oral Daily   lidocaine  2 patch Transdermal Q24H   metoprolol succinate  50 mg Oral Daily   oxyCODONE  15 mg Oral Q12H   polyethylene glycol  17 g Oral Daily   torsemide  20 mg Oral Daily   zinc sulfate  220 mg Oral Daily   Continuous  Infusions:  sodium chloride 10 mL/hr at 07/29/21 0816   lactated ringers Stopped (07/23/21 1013)   penicillin g continuous IV infusion 12 Million Units (08/03/21 1530)       Vernell Leep, MD,  FACP, [facility], [facility], [facility] (Care Management Physician Certified) [place]  To contact the attending provider between 7A-7P or the covering provider during after hours 7P-7A, please log into the web site [email or web address] and access using universal [place] password for that web site. If you do not have the password, please call the hospital operator.

## 2021-08-04 DIAGNOSIS — I4892 Unspecified atrial flutter: Secondary | ICD-10-CM | POA: Diagnosis not present

## 2021-08-04 DIAGNOSIS — I10 Essential (primary) hypertension: Secondary | ICD-10-CM | POA: Diagnosis not present

## 2021-08-04 DIAGNOSIS — M4644 Discitis, unspecified, thoracic region: Secondary | ICD-10-CM

## 2021-08-04 DIAGNOSIS — N179 Acute kidney failure, unspecified: Secondary | ICD-10-CM | POA: Diagnosis not present

## 2021-08-04 DIAGNOSIS — Z89511 Acquired absence of right leg below knee: Secondary | ICD-10-CM | POA: Diagnosis not present

## 2021-08-04 DIAGNOSIS — I5032 Chronic diastolic (congestive) heart failure: Secondary | ICD-10-CM | POA: Diagnosis not present

## 2021-08-04 DIAGNOSIS — A419 Sepsis, unspecified organism: Secondary | ICD-10-CM | POA: Diagnosis not present

## 2021-08-04 DIAGNOSIS — E11628 Type 2 diabetes mellitus with other skin complications: Secondary | ICD-10-CM | POA: Diagnosis not present

## 2021-08-04 DIAGNOSIS — R079 Chest pain, unspecified: Secondary | ICD-10-CM | POA: Diagnosis not present

## 2021-08-04 LAB — GLUCOSE, CAPILLARY
Glucose-Capillary: 241 mg/dL — ABNORMAL HIGH (ref 70–99)
Glucose-Capillary: 181 mg/dL — ABNORMAL HIGH (ref 70–99)
Glucose-Capillary: 185 mg/dL — ABNORMAL HIGH (ref 70–99)
Glucose-Capillary: 112 mg/dL — ABNORMAL HIGH (ref 70–99)
Glucose-Capillary: 178 mg/dL — ABNORMAL HIGH (ref 70–99)

## 2021-08-04 LAB — BASIC METABOLIC PANEL
Anion gap: 6 (ref 5–15)
BUN: 37 mg/dL — ABNORMAL HIGH (ref 6–20)
CO2: 34 mmol/L — ABNORMAL HIGH (ref 22–32)
Calcium: 8.4 mg/dL — ABNORMAL LOW (ref 8.9–10.3)
Chloride: 91 mmol/L — ABNORMAL LOW (ref 98–111)
Creatinine, Ser: 1.72 mg/dL — ABNORMAL HIGH (ref 0.61–1.24)
GFR, Estimated: 46 mL/min — ABNORMAL LOW (ref >60)
Glucose, Bld: 267 mg/dL — ABNORMAL HIGH (ref 70–99)
Potassium: 4 mmol/L (ref 3.5–5.1)
Sodium: 131 mmol/L — ABNORMAL LOW (ref 135–145)

## 2021-08-04 LAB — CBC
HCT: 38.8 % — ABNORMAL LOW (ref 39.0–52.0)
Hemoglobin: 12.4 g/dL — ABNORMAL LOW (ref 13.0–17.0)
MCH: 28.4 pg (ref 26.0–34.0)
MCHC: 32 g/dL (ref 30.0–36.0)
MCV: 88.8 fL (ref 80.0–100.0)
Platelets: 386 10*3/uL (ref 150–400)
RBC: 4.37 MIL/uL (ref 4.22–5.81)
RDW: 14.9 % (ref 11.5–15.5)
WBC: 11.9 10*3/uL — ABNORMAL HIGH (ref 4.0–10.5)
nRBC: 0 % (ref 0.0–0.2)

## 2021-08-04 MED ORDER — JUVEN PO PACK
1.0000 | PACK | Freq: Two times a day (BID) | ORAL | Status: DC
  Administered 2021-08-04 – 2021-08-20 (×3): 1 via ORAL
  Filled 2021-08-04 (×16): qty 1

## 2021-08-04 MED ORDER — ENSURE MAX PROTEIN PO LIQD
11.0000 [oz_av] | Freq: Every day | ORAL | Status: DC
  Administered 2021-08-04 – 2021-08-08 (×4): 11 [oz_av] via ORAL
  Filled 2021-08-04 (×6): qty 330

## 2021-08-04 MED ORDER — ADULT MULTIVITAMIN W/MINERALS CH
1.0000 | ORAL_TABLET | Freq: Every day | ORAL | Status: DC
  Administered 2021-08-04 – 2021-09-09 (×37): 1 via ORAL
  Filled 2021-08-04 (×37): qty 1

## 2021-08-04 NOTE — Progress Notes (Signed)
Physical Therapy Treatment Patient Details Name: [name] MRN: [id number] DOB: Jun 13, 1966 Today's Date: 08/04/2021   History of Present Illness 55 yo admitted 11/14 with infected left foot s/p Left BKA 11/16. Pt with T6-8 discitis. PMhx: Rt BKA, Aflutter, DM, depression and anxiety    PT Comments    Pt was seen for attempted mobility to transfer to the chair but declined over ongoing back and hip pain.  Pt is able to do some abbreviated ex's for back and legs, with care to avoid painful hip flexion and abd beyond a few degrees.  Follow along with him as further testing is done tomorrow to image spine in MRI, and will see if specific recommendations come from this intervention.   Follow acute PT plan for transfers and there exercise as tolerated, and encourage OOB as he is willing to try. Per pt the prosthetic for RLE is not currently fitting due to fluid from CHF.   Recommendations for follow up therapy are one component of a multi-disciplinary discharge planning process, led by the attending physician.  Recommendations may be updated based on patient status, additional functional criteria and insurance authorization.  Follow Up Recommendations  Acute inpatient rehab (3hours/day)     Assistance Recommended at Discharge Frequent or constant Supervision/Assistance  Equipment Recommendations  Wheelchair (measurements PT);Wheelchair cushion (measurements PT)    Recommendations for Other Services Rehab consult     Precautions / Restrictions Precautions Precautions: Fall Precaution Comments: wound vac L LE Required Braces or Orthoses: Other Brace Other Brace: LLE limb protector, R BK prosthesis Restrictions Weight Bearing Restrictions: Yes LLE Weight Bearing: Non weight bearing Other Position/Activity Restrictions: RLE prosthetic is not fitting currently per pt     Mobility  Bed Mobility Overal bed mobility: Needs Assistance             General bed mobility comments:  declined to move in the bed due to severe back pain.    Transfers                   General transfer comment: declined    Ambulation/Gait               General Gait Details: declined   Stairs             Wheelchair Mobility    Modified Rankin (Stroke Patients Only)       Balance                                            Cognition Arousal/Alertness: Awake/alert Behavior During Therapy: WFL for tasks assessed/performed;Anxious Overall Cognitive Status: Within Functional Limits for tasks assessed                                          Exercises General Exercises - Lower Extremity Quad Sets: AROM;10 reps Gluteal Sets: AROM;10 reps Amputee Exercises Straight Leg Raises: AAROM;5 reps (hip rotation B LE's)    General Comments General comments (skin integrity, edema, etc.): pt is awake and declined out of bed but agreed to get in some exercises.  Cannot move hips in abd/add well but can do rotation with some relief of stiffness and pain in back and hips      Pertinent Vitals/Pain Pain Assessment: 0-10 Pain Score: 10-Worst pain  ever Pain Location: back and LLE, posterior hip Pain Descriptors / Indicators: Grimacing;Guarding;Moaning;Pressure Pain Intervention(s): Monitored during session;Repositioned;Limited activity within patient's tolerance;Other (comment) (awaiting meds from nursing)    Home Living                          Prior Function            PT Goals (current goals can now be found in the care plan section) Acute Rehab PT Goals Patient Stated Goal: be able to drive, get prosthetic and walk, return home Progress towards PT goals: Not progressing toward goals - comment    Frequency    Min 3X/week      PT Plan Current plan remains appropriate    Co-evaluation              AM-PAC PT "6 Clicks" Mobility   Outcome Measure  Help needed turning from your back to your side  while in a flat bed without using bedrails?: A Lot Help needed moving from lying on your back to sitting on the side of a flat bed without using bedrails?: A Lot Help needed moving to and from a bed to a chair (including a wheelchair)?: Total Help needed standing up from a chair using your arms (e.g., wheelchair or bedside chair)?: Total Help needed to walk in hospital room?: Total Help needed climbing 3-5 steps with a railing? : Total 6 Click Score: 8    End of Session   Activity Tolerance: Patient limited by pain Patient left: in bed;with call bell/phone within reach;with nursing/sitter in room Nurse Communication: Mobility status PT Visit Diagnosis: Other abnormalities of gait and mobility (R26.89);Muscle weakness (generalized) (M62.81)     Time: 8546-2703 PT Time Calculation (min) (ACUTE ONLY): 21 min  Charges:  $Therapeutic Exercise: 8-22 mins               Ramond Dial 08/04/2021, 4:23 PM  Mee Hives, PT PhD Acute Rehab Dept. Number: [place]

## 2021-08-04 NOTE — Progress Notes (Signed)
Initial Nutrition Assessment  DOCUMENTATION CODES:  Not applicable  INTERVENTION:  Continue Glucerna shakes TID.  Add Ensure Max po daily, each supplement provides 150 kcal and 30 grams of protein.   Add 1 packet Juven BID, each packet provides 95 calories, 2.5 grams of protein (collagen), and 9.8 grams of carbohydrate (3 grams sugar); also contains 7 grams of L-arginine and L-glutamine, 300 mg vitamin C, 15 mg vitamin E, 1.2 mcg vitamin B-12, 9.5 mg zinc, 200 mg calcium, and 1.5 g  Calcium Beta-hydroxy-Beta-methylbutyrate to support wound healing.  Add MVI with minerals daily.  NUTRITION DIAGNOSIS:  Increased nutrient needs related to wound healing as evidenced by estimated needs.  GOAL:  Patient will meet greater than or equal to 90% of their needs  MONITOR:  PO intake, Supplement acceptance, Labs, Weight trends, Skin, I & O's  REASON FOR ASSESSMENT:  Consult Assessment of nutrition requirement/status  ASSESSMENT:  55 yo male with a PMH of diabetes mellitus type 2, diabetic foot infection with prior right BKA, chronic diastolic CHF, hypertension, dyslipidemia presented with worsening left foot swelling/discoloration and discharge. Admitted with discitis of thoracic region. 11/16 - L BKA 11/22 - TEE, cardioversion, bubble study  Pt with risk of spinal infection. Continues to be admitted for IV antibiotics.  Spoke with pt at bedside. Pt reports no changes in his appetite recently. He reports eating very well at home PTA and reports eating well here as well.  Pt with no recent meal completions documented.   Pt unsure of any recent weight changes. He reports obvious weight loss with L BKA, but is unsure other than that.  Admit wt: 108.9 kg Current wt: 107 kg  RD to order Ensure Max daily, Juven BID, and MVI with minerals daily to aid in wound healing.  Attached "Carbohydrate Counting for People with Diabetes" handout from the Academy of Nutrition and Dietetics to discharge  information.  Supplements: Glucerna shakes TID  Medications: reviewed; Vitamin C, colace, SSI, Levemir, oxycodone PO BID, miralax, zinc sulfate, oxycodone PO PRN (given twice today)  Labs: reviewed; Na 131 (L), BUN 37 (H - trending down), Crt 1.72 (H - trending down), CBG 176-320 (H) HbA1c: 13.8% (07/22/2021)  NUTRITION - FOCUSED PHYSICAL EXAM: Flowsheet Row Most Recent Value  Orbital Region No depletion  Upper Arm Region No depletion  Thoracic and Lumbar Region No depletion  Buccal Region No depletion  Temple Region No depletion  Clavicle Bone Region No depletion  Clavicle and Acromion Bone Region No depletion  Scapular Bone Region No depletion  Dorsal Hand No depletion  Patellar Region No depletion  Anterior Thigh Region No depletion  Posterior Calf Region No depletion  Edema (RD Assessment) Mild  Hair Reviewed  Eyes Reviewed  Mouth Reviewed  Skin Reviewed  Nails Reviewed   Diet Order:   Diet Order             Diet heart healthy/carb modified Room service appropriate? Yes; Fluid consistency: Thin  Diet effective now                  EDUCATION NEEDS:  Education needs have been addressed  Skin:  Skin Assessment: Skin Integrity Issues: Skin Integrity Issues:: Stage II, Incisions Stage II: Sacrum Incisions: L leg, closed (11/16)  Last BM:  08/03/21 - x3, Type 2  Height:  Ht Readings from Last 1 Encounters:  07/29/21 6\' 3"  (1.905 m)   Weight:  Wt Readings from Last 1 Encounters:  08/01/21 107 kg   BMI:  Body mass index is 29.48 kg/m.  Estimated Nutritional Needs:  Kcal:  2300-2500 Protein:  135-150 grams Fluid:  >2.3 L  Derrel Nip, RD, LDN (she/her/hers) Clinical Inpatient Dietitian RD Pager/After-Hours/Weekend Pager # in [place]

## 2021-08-04 NOTE — Progress Notes (Signed)
Cardiology Progress Note  [name] MRN: [id number] DOB: 01-06-1966 Date of Encounter: 08/04/2021  Primary Cardiologist: Sanda Klein, MD  Subjective   No events overnight.  No CP, SOB, DOE.  Hoping for a boring MRI tomorrow.  Inpatient Medications  Scheduled Meds:  acetaminophen  1,000 mg Oral TID   amiodarone  200 mg Oral BID   [START ON 08/06/2021] amoxicillin  1,000 mg Oral Q8H   apixaban  5 mg Oral BID   vitamin C  1,000 mg Oral Daily   docusate sodium  100 mg Oral Daily   feeding supplement (GLUCERNA SHAKE)  237 mL Oral TID BM   gabapentin  300 mg Oral TID   hydrALAZINE  25 mg Oral Q8H   insulin aspart  0-15 Units Subcutaneous TID WC   insulin aspart  0-5 Units Subcutaneous QHS   insulin aspart  5 Units Subcutaneous TID WC   insulin detemir  25 Units Subcutaneous BID   isosorbide mononitrate  30 mg Oral Daily   lidocaine  2 patch Transdermal Q24H   metoprolol succinate  50 mg Oral Daily   multivitamin with minerals  1 tablet Oral Daily   nutrition supplement (JUVEN)  1 packet Oral BID BM   oxyCODONE  15 mg Oral Q12H   polyethylene glycol  17 g Oral Daily   Ensure Max Protein  11 oz Oral Daily   torsemide  20 mg Oral Daily   zinc sulfate  220 mg Oral Daily   Continuous Infusions:  sodium chloride 10 mL/hr at 07/29/21 0816   lactated ringers Stopped (07/23/21 1013)   penicillin g continuous IV infusion 12 Million Units (08/04/21 0532)   PRN Meds: alum & mag hydroxide-simeth, bisacodyl, diphenhydrAMINE, guaiFENesin-dextromethorphan, hydrALAZINE, HYDROmorphone (DILAUDID) injection, LORazepam, ondansetron, oxyCODONE, phenol, polyethylene glycol, traZODone   Vital Signs   Vitals:   08/03/21 1940 08/03/21 2345 08/04/21 0522 08/04/21 1322  BP: 134/76  118/67 (!) 109/56  Pulse:   61 60  Resp: 16 17 17 19   Temp: 98.4 F (36.9 C)  98.2 F (36.8 C) 98.3 F (36.8 C)  TempSrc: Oral  Oral Oral  SpO2:   94% 94%  Weight:      Height:         Intake/Output Summary (Last 24 hours) at 08/04/2021 1400 Last data filed at 08/04/2021 1100 Gross per 24 hour  Intake 4979.64 ml  Output 3200 ml  Net 1779.64 ml   Last 3 Weights 08/01/2021 07/29/2021 07/27/2021  Weight (lbs) 235 lb 14.3 oz 238 lb 8.6 oz 238 lb 8.6 oz  Weight (kg) 107 kg 108.2 kg 108.2 kg      Telemetry   SR  Physical Exam   Vitals:   08/03/21 1940 08/03/21 2345 08/04/21 0522 08/04/21 1322  BP: 134/76  118/67 (!) 109/56  Pulse:   61 60  Resp: 16 17 17 19   Temp: 98.4 F (36.9 C)  98.2 F (36.8 C) 98.3 F (36.8 C)  TempSrc: Oral  Oral Oral  SpO2:   94% 94%  Weight:      Height:        Intake/Output Summary (Last 24 hours) at 08/04/2021 1400 Last data filed at 08/04/2021 1100 Gross per 24 hour  Intake 4979.64 ml  Output 3200 ml  Net 1779.64 ml    Last 3 Weights 08/01/2021 07/29/2021 07/27/2021  Weight (lbs) 235 lb 14.3 oz 238 lb 8.6 oz 238 lb 8.6 oz  Weight (kg) 107 kg 108.2 kg 108.2 kg  Body mass index is 29.48 kg/m.   Gen: no distress, appears older than stated age Neck: mild JVD to the lower neck Cardiac: No Rubs or Gallops,  Murmur, normal rate +2 radial pulses Respiratory: Clear to auscultation bilaterally but tachypnea after auscultation GI: Soft, nontender, non-distended  MS: amputation site appears healed Integument: Skin feels warm Neuro:  At time of evaluation, alert and oriented to person/place/time/situation  Psych: Normal affect, patient feels anxious   Labs  High Sensitivity Troponin:   Recent Labs  Lab 07/22/21 0246 07/22/21 0645  TROPONINIHS 52* 44*     Cardiac EnzymesNo results for input(s): TROPONINI in the last 168 hours. No results for input(s): TROPIPOC in the last 168 hours.  Chemistry Recent Labs  Lab 08/02/21 0232 08/03/21 0204 08/04/21 0221  NA 127* 129* 131*  K 3.9 3.8 4.0  CL 84* 89* 91*  CO2 35* 34* 34*  GLUCOSE 243* 262* 267*  BUN 38* 43* 37*  CREATININE 1.77* 1.81* 1.72*  CALCIUM 8.3* 8.2*  8.4*  PROT 5.4*  --   --   ALBUMIN <1.5*  --   --   AST 33  --   --   ALT 33  --   --   ALKPHOS 77  --   --   BILITOT 0.7  --   --   GFRNONAA 45* 44* 46*  ANIONGAP 8 6 6     Hematology Recent Labs  Lab 07/31/21 0130 08/01/21 0259 08/04/21 0221  WBC 14.8* 13.9* 11.9*  RBC 4.88 4.82 4.37  HGB 14.0 13.5 12.4*  HCT 41.4 41.5 38.8*  MCV 84.8 86.1 88.8  MCH 28.7 28.0 28.4  MCHC 33.8 32.5 32.0  RDW 15.5 15.2 14.9  PLT 318 349 386   BNPNo results for input(s): BNP, PROBNP in the last 168 hours.  DDimer No results for input(s): DDIMER in the last 168 hours.   Radiology  No results found.  Cardiac Studies  TTE 07/23/2021   1. Left ventricular ejection fraction, by estimation, is 35%. The left  ventricle has moderately decreased function. The left ventricle  demonstrates global hypokinesis. There is mild left ventricular  hypertrophy. Left ventricular diastolic parameters are   indeterminate.   2. Right ventricular systolic function is moderately reduced. The right  ventricular size is normal. There is moderately elevated pulmonary artery  systolic pressure. The estimated right ventricular systolic pressure is  62.9 mmHg.   3. Left atrial size was mildly dilated.   4. Right atrial size was mildly dilated.   5. The mitral valve is normal in structure. Trivial mitral valve  regurgitation. No evidence of mitral stenosis.   6. The aortic valve is tricuspid. Aortic valve regurgitation is not  visualized. No aortic stenosis is present.   7. Aortic dilatation noted. There is mild dilatation of the aortic root,  measuring 39 mm.   8. The inferior vena cava is dilated in size with <50% respiratory  variability, suggesting right atrial pressure of 15 mmHg.   9. No vegetation visualized, but if high suspicion would need TEE.  10. The patient was in atrial fibrillation.   Patient Profile  [name] is a 55 y.o. male with heart failure with preserved ejection fraction,  uncontrolled diabetes, CKD stage III who was admitted on 07/22/2021 with sepsis secondary to left lower extremity osteomyelitis.  Cardiology was consulted due to new onset atrial flutter as well as new onset systolic heart failure.  Assessment & Plan   #New onset systolic heart failure,  EF 35% #New onset atrial flutter status post TEE/cardioversion CHADSAVC 2 with 07/29/21 DCCV - on amiodarone after 8 g will transition to 200 mg PO daily - continuing torsemide 20 mg PO daily with stable kidney function -Not a candidate for ACE or/ARB/Arni/MRA given significant kidney dysfunction.   - Continue metoprolol succinate 50 mg daily.   - on afterload reduction: hydralazine 25 mg 3 times daily and Imdur 30 mg daily  -On Eliquis 5 mg twice daily - based on kidney function and improved edema, will decide maintenance diuretic  #Sepsis/GBS bacteremia -on Abx -no signs of endocarditis    For questions or updates, please contact [facility] Please consult [email or web address] for contact info under   Rudean Haskell, [place]  [address] [place], [place] [address] [phone]  2:03 PM

## 2021-08-04 NOTE — Progress Notes (Signed)
[facility] for Infectious Disease  Date of Admission:  07/21/2021   Total days of inpatient antibiotics 15  Principal Problem:   Discitis of thoracic region Active Problems:   Essential hypertension   Stage 3a chronic kidney disease (HCC)   Type 2 diabetes mellitus with diabetic polyneuropathy, with long-term current use of insulin (HCC)   Right below-knee amputee (HCC)   Diabetic infection of left foot (HCC)   Chronic diastolic (congestive) heart failure (HCC)   Severe sepsis with acute organ dysfunction (HCC)   Chest pain   AKI (acute kidney injury) ([place])   Gas gangrene of foot (HCC)   Atrial flutter (HCC)   Septic shock (HCC)   Systolic dysfunction   Pressure injury of skin   MSSA bacteremia          Assessment: 55 year old male with poorly controlled diabetes, A1c 13.8, status post right BKA admitted for diabetic foot ulcer osteomyelitis now status post left BKA.  Infectious disease consulted at blood cultures grew group B strep.  MRI w/o contrast ordered which showed possible T6-T8 bone marrow edema consistent with possible discitis/osteomyelitis.    #GBS bacteremia SP left BKA #Thoracic Osteomyelitis(OM)  -OM seen on MRI cervical lumbar and thoracic without contrast due to renal injury. Pt continued to have back pain. NSY consulted and no plans for surgery. -Back pain seemed to be improving. TEE did not show vegetation. ID signed off with treatment plan for bacteremia and vertebral OM.  -Pain abruptly worsened and ID re-engaged. Plan to obtain MRI with contrast due to persistent and now worsening back pain.  Recommendations: -Obtain MRI thoracic/lumbar with contrast -Complete 2 weeks of parenteral therapy with penicillin from 07/23/21-08/05/21 then transition to amoxicillin 1gm tid(EOT 09/14/21) to complete 8 weeks of antibiotics (OM).   Microbiology:   Antibiotics: Cefazolin 11/16 Ceftriaxone 11/14, 11/16-17 Clindamycin 11/15-16 Linezolid 11/15 Pen G  11/18-p Pip-tazo 11/15-11/16 Vancomycin 11/14  Cultures: Blood Cx: 11/14 2/2 GBS  Group b strep(s.agalactiae)isolated    MIC    AMPICILLIN <=0.25 SENS... Sensitive    CEFTRIAXONE <=0.12 SENS... Sensitive    CLINDAMYCIN <=0.25 SENS... Sensitive    ERYTHROMYCIN <=0.12 SENS... Sensitive    LEVOFLOXACIN 0.5 SENSITIVE  Sensitive    VANCOMYCIN 0.5 SENSITIVE  Sensitive    11/16 NGTD  11/14 Urine Cx GBS SUBJECTIVE: Resting in bed. Denies fever, chills. Reports back pain has worsened.    Interval: Afebrile, wbc 11.9K. Review of Systems: Review of Systems  All other systems reviewed and are negative.   Scheduled Meds:  acetaminophen  1,000 mg Oral TID   amiodarone  200 mg Oral BID   [START ON 08/06/2021] amoxicillin  1,000 mg Oral Q8H   apixaban  5 mg Oral BID   vitamin C  1,000 mg Oral Daily   docusate sodium  100 mg Oral Daily   feeding supplement (GLUCERNA SHAKE)  237 mL Oral TID BM   gabapentin  300 mg Oral TID   hydrALAZINE  25 mg Oral Q8H   insulin aspart  0-15 Units Subcutaneous TID WC   insulin aspart  0-5 Units Subcutaneous QHS   insulin aspart  5 Units Subcutaneous TID WC   insulin detemir  25 Units Subcutaneous BID   isosorbide mononitrate  30 mg Oral Daily   lidocaine  2 patch Transdermal Q24H   metoprolol succinate  50 mg Oral Daily   multivitamin with minerals  1 tablet Oral Daily   nutrition supplement (JUVEN)  1  packet Oral BID BM   oxyCODONE  15 mg Oral Q12H   polyethylene glycol  17 g Oral Daily   Ensure Max Protein  11 oz Oral Daily   torsemide  20 mg Oral Daily   zinc sulfate  220 mg Oral Daily   Continuous Infusions:  sodium chloride 10 mL/hr at 07/29/21 0816   lactated ringers Stopped (07/23/21 1013)   penicillin g continuous IV infusion 12 Million Units (08/04/21 0532)   PRN Meds:.alum & mag hydroxide-simeth, bisacodyl, diphenhydrAMINE, guaiFENesin-dextromethorphan, hydrALAZINE, HYDROmorphone (DILAUDID) injection, LORazepam, ondansetron,  oxyCODONE, phenol, polyethylene glycol, traZODone Allergies  Allergen Reactions   Bactrim [Sulfamethoxazole-Trimethoprim]    Ceprotin [Protein C Concentrate (Human)]    Ciprofloxacin Other (See Comments)    Kidney function   Levaquin [Levofloxacin]     OBJECTIVE: Vitals:   08/03/21 1604 08/03/21 1940 08/03/21 2345 08/04/21 0522  BP: 116/61 134/76  118/67  Pulse: 63   61  Resp: 11 16 17 17   Temp: 98 F (36.7 C) 98.4 F (36.9 C)  98.2 F (36.8 C)  TempSrc: Oral Oral  Oral  SpO2:    94%  Weight:      Height:       Body mass index is 29.48 kg/m.  Physical Exam Constitutional:      General: He is not in acute distress.    Appearance: He is normal weight. He is not toxic-appearing.  HENT:     Head: Normocephalic and atraumatic.     Right Ear: External ear normal.     Left Ear: External ear normal.     Nose: No congestion or rhinorrhea.     Mouth/Throat:     Mouth: Mucous membranes are moist.     Pharynx: Oropharynx is clear.  Eyes:     Extraocular Movements: Extraocular movements intact.     Conjunctiva/sclera: Conjunctivae normal.     Pupils: Pupils are equal, round, and reactive to light.  Cardiovascular:     Rate and Rhythm: Normal rate and regular rhythm.     Heart sounds: No murmur heard.   No friction rub. No gallop.  Pulmonary:     Effort: Pulmonary effort is normal.     Breath sounds: Normal breath sounds.  Abdominal:     General: Abdomen is flat. Bowel sounds are normal.     Palpations: Abdomen is soft.  Musculoskeletal:        General: No swelling.     Cervical back: Normal range of motion and neck supple.     Comments: B/l BKA  Left BKA wound vac in place.   Skin:    General: Skin is warm and dry.  Neurological:     General: No focal deficit present.     Mental Status: He is oriented to person, place, and time.  Psychiatric:        Mood and Affect: Mood normal.      Lab Results Lab Results  Component Value Date   WBC 11.9 (H) 08/04/2021    HGB 12.4 (L) 08/04/2021   HCT 38.8 (L) 08/04/2021   MCV 88.8 08/04/2021   PLT 386 08/04/2021    Lab Results  Component Value Date   CREATININE 1.72 (H) 08/04/2021   BUN 37 (H) 08/04/2021   NA 131 (L) 08/04/2021   K 4.0 08/04/2021   CL 91 (L) 08/04/2021   CO2 34 (H) 08/04/2021    Lab Results  Component Value Date   ALT 33 08/02/2021   AST 33 08/02/2021  ALKPHOS 77 08/02/2021   BILITOT 0.7 08/02/2021        Laurice Record, [facility] for Infectious [facility] 08/04/2021, 1:14 PM

## 2021-08-04 NOTE — Care Management Important Message (Signed)
Important Message  Patient Details  Name: [name] MRN: [id number] Date of Birth: 12/06/1965   Medicare Important Message Given:  Yes     Shelda Altes 08/04/2021, 11:05 AM

## 2021-08-04 NOTE — Progress Notes (Signed)
Inpatient Rehab Admissions Coordinator:   Per [facility] discussion with MD, pt not medically ready for CIR today.  Spoke to [place], with [facility], who advised with withdraw CIR request and restart when medically ready.  Case voided and I will f/u tomorrow.  Note pending MRI.    Shann Medal, PT, DPT Admissions Coordinator [phone] 08/04/21  11:40 AM

## 2021-08-04 NOTE — Progress Notes (Signed)
PROGRESS NOTE  [name]  FAO:[id number] DOB: Jan 09, 1966 DOA: 07/21/2021 PCP: Patient, No Pcp Per (Inactive)   Brief Narrative: 55 year old male with history of diabetes mellitus type 2, diabetic foot infection with prior right BKA, chronic diastolic CHF, hypertension, dyslipidemia presented with worsening left foot swelling/discoloration and discharge.  On presentation, he was hypotensive, tachycardic with A. fib with RVR with CT suggestive of first MTP osteomyelitis and soft tissue gas concerning for possible necrotizing fasciitis along with AKI with creatinine of 2.7 and mild DKA.  His blood pressures did not improve with IV fluids and he was transferred to ICU and PCCM was consulted.  Orthopedics was consulted.  He underwent left transtibial amputation on 07/23/2021.  He was found to have strep agalactiae bacteremia.  ID was consulted.  He was found to have vertebral discitis versus osteomyelitis on MRI.  He was also found to have new systolic dysfunction with LVEF of 35% along with new onset atrial flutter requiring IV amiodarone which has been switched to oral.  Cardiology was consulted.  He has been started on Eliquis.  S/p TEE followed by DCCV on 11/22.  Developed worsening back pain 11/26-11/27, ID reconsulted, requested MRI T/L-spine without contrast (contrast precluded by elevated creatinine).  Assessment & Plan: Principal Problem:   Discitis of thoracic region Active Problems:   Essential hypertension   Stage 3a chronic kidney disease (HCC)   Type 2 diabetes mellitus with diabetic polyneuropathy, with long-term current use of insulin (HCC)   Right below-knee amputee (HCC)   Diabetic infection of left foot (HCC)   Chronic diastolic (congestive) heart failure (HCC)   Severe sepsis with acute organ dysfunction (HCC)   Chest pain   AKI (acute kidney injury) ([place])   Gas gangrene of foot (HCC)   Atrial flutter (HCC)   Septic shock (HCC)   Systolic dysfunction   Pressure injury of  skin   MSSA bacteremia   Septic shock: Evolving on admission from bacteremia/diabetic foot infection -Off pressors.  Shock resolved. -Required care in ICU under PCCM service: Care has been transferred to [facility] from 07/27/2021 onwards   Diabetic foot infection, left: Status post left transtibial amputation on 07/23/2021.  Wound/wound VAC care as per Dr. Sharol Given.  - Will ask Dr. Sharol Given follow-up early this week.    Group B streptococcus bacteremia, Possible thoracic discitis/osteomyelitis. No vegetation on TTE or TEE during cardioversion.  MRI of cervical/thoracic/lumbar spine showed possible thoracic osteomyelitis/discitis without evidence of abscess but MRI was without contrast.   - Neurosurgery evaluation appreciated: Not convinced that patient has epidural abscess.  Recommended to manage conservatively with IV antibiotics. -Antibiotics have been changed to penicillin.  Patient will probably need 8 weeks of antibiotics per ID (2 weeks of parenteral therapy with penicillin from 07/23/2021-08/05/2021; then transition to amoxicillin 1 g 3 times daily till 09/14/2021). -Repeat blood cultures from 07/24/2021 were negative -Patient developed worsening of back pain when attempting to sit up with PT yesterday, ID was reconsulted, may need more prolonged IV abx. contrast is really preferred in this setting to r/o epidural abscess. I've ordered this MRI and discussed with ID personally. Will require general anesthesia for this due to pain. Discussed risks inherent with this as well with the patient today who consents to proceed.    AKI on stage IIIa CKD: AKI resolving with limitation on diuresis. Had coronary CTA done earlier this year, no implanted devices. Discussed risk of NSF to which the patient confirms understanding and gives consent to proceed with contrasted MRI.  -  Monitor BMP, avoid nephrotoxins.   Pt takes testosterone (not prescription):  - Check level (total, free)   Hyponatremia: Improved.    New onset systolic CHF: Echo showed EF of 35% (previous echo showed EF of 50 to 55% in 04/2021). Cardiomyopathy may be from new onset atrial flutter RVR related tachycardia versus stress induced cardiomyopathy from bacteremia and septic shock. - After IV diuresis, then holding for AKI, cardiology has settled on torsemide 20mg  daily for now. - Continue metop succinate, bidil. AKI limiting ACE/ARB/ARNI and MRA.  New onset atrial flutter: Maintained NSR since DCCV 11/22 - Continue metoprolol succinate 50 Mg daily. - Continue amiodarone 200mg  twice daily x7 days and then 200 Mg daily. - Continue eliquis 5 Mg twice daily. MUST remain on this for 1 month post DCCV.   Hypomagnesemia: Improved with supplementation.  - Follow periodically.   Leukocytosis: Continues improving.    T2DM uncontrolled with hyperglycemia:  - Increased Levemir to 25 units twice daily, for now continue NovoLog 5 units 3 times daily with meals and SSI.  Further titrate insulins as needed. -Counseled patient regarding importance of dietary restrictions.  Thrombocytopenia - Resolved   Generalized deconditioning - PT recommends CIR which will be pursued once work up has been completed.    Acute on chronic pain: Follows with Dr. Nelva Bush, pain management.  Has received steroid injections to the back.  - Reviewed [place] PDMP and patient regularly fills opioid prescriptions. Likely opioid-dependent. - Obviously has reasons for acute pain from recent surgery, discitis/osteomyelitis. - Initiated OxyContin 10 Mg every 12 hours on 11/23.  This was increased to 15 mg every 12 hours on 11/27 - Evaluating etiology for worsening back pain as noted above.   Patient on following regimen after making changes: - Tylenol 1 g 3 times daily. - Oxy IR 10 mg every 4 hourly as needed for moderate pain - OxyContin 15 Mg every 12 hours - Lidocaine patch increased to 2 patches - Dilaudid 0.5 mg IV every 4 hourly as needed severe pain - Gabapentin  300 mg 3 times daily   Insomnia - Started trazodone 50 Mg at bedtime as needed on 11/24 with good effect, continue.  RN Pressure Injury Documentation: Pressure Injury 07/27/21 Sacrum Mid Stage 2 -  Partial thickness loss of dermis presenting as a shallow open injury with a red, pink wound bed without slough. small pink pressure ulcer with break in skin (Active)  07/27/21 0255  Location: Sacrum  Location Orientation: Mid  Staging: Stage 2 -  Partial thickness loss of dermis presenting as a shallow open injury with a red, pink wound bed without slough.  Wound Description (Comments): small pink pressure ulcer with break in skin  Present on Admission: Yes   DVT prophylaxis: Eliquis Code Status: Full Family Communication: None at bedside Disposition Plan:  Status is: Inpatient  Remains inpatient appropriate because: Requires ongoing work up with MRI, ID recommendations, then would pursue rehabilitation   Consultants:  ID Neurosurgery PCCM Cardiology  Procedures:  Left TTA 07/23/2021 TERE/DCCV 07/29/2021  Antimicrobials: Cefazolin 11/16 Ceftriaxone 11/14, 11/16-17 Clindamycin 11/15-16 Linezolid 11/15 Pen G 11/18-p Pip-tazo 11/15-11/16 Vancomycin 11/14  Subjective: Pain is moderate-severe, worse when attempting to rise. Mostly in mid back but radiates up and down both. No fevers. No numbness or weakness. Eating ok, having BMs.   Objective: Vitals:   08/03/21 1940 08/03/21 2345 08/04/21 0522 08/04/21 1322  BP: 134/76  118/67 (!) 109/56  Pulse:   61 60  Resp: 16 17 17  19  Temp: 98.4 F (36.9 C)  98.2 F (36.8 C) 98.3 F (36.8 C)  TempSrc: Oral  Oral Oral  SpO2:   94% 94%  Weight:      Height:        Intake/Output Summary (Last 24 hours) at 08/04/2021 1725 Last data filed at 08/04/2021 1635 Gross per 24 hour  Intake 4979.64 ml  Output 3350 ml  Net 1629.64 ml   Filed Weights   07/27/21 0252 07/29/21 0754 08/01/21 0459  Weight: 108.2 kg 108.2 kg 107 kg     Gen: 55 y.o. male in no distress Pulm: Non-labored breathing room air. Clear to auscultation bilaterally.  CV: Regular rate and rhythm. No murmur, rub, or gallop. No JVD, no dependent edema. GI: Abdomen soft, non-tender, non-distended, with normoactive bowel sounds. No organomegaly or masses felt. Ext: Warm, BLE amputations, LLE with wound vac in place.  Skin: No other rashes, lesions or ulcers Neuro: Alert and oriented. No focal neurological deficits. Psych: Judgement and insight appear normal. Mood & affect appropriate.   Data Reviewed: I have personally reviewed following labs and imaging studies  CBC: Recent Labs  Lab 07/29/21 0210 07/30/21 0144 07/31/21 0130 08/01/21 0259 08/04/21 0221  WBC 15.4* 15.0* 14.8* 13.9* 11.9*  HGB 14.0 13.6 14.0 13.5 12.4*  HCT 43.1 40.2 41.4 41.5 38.8*  MCV 85.9 84.8 84.8 86.1 88.8  PLT 280 297 318 349 245   Basic Metabolic Panel: Recent Labs  Lab 07/31/21 0130 08/01/21 0259 08/01/21 1634 08/02/21 0232 08/03/21 0204 08/04/21 0221  NA 128* 129*  --  127* 129* 131*  K 4.3 4.2  --  3.9 3.8 4.0  CL 91* 89*  --  84* 89* 91*  CO2 29 33*  --  35* 34* 34*  GLUCOSE 282* 286*  --  243* 262* 267*  BUN 28* 29*  --  38* 43* 37*  CREATININE 1.40* 1.52*  --  1.77* 1.81* 1.72*  CALCIUM 8.0* 8.1*  --  8.3* 8.2* 8.4*  MG 1.6* 1.5* 1.9 1.7 1.8  --    GFR: Estimated Creatinine Clearance: 64.2 mL/min (A) (by C-G formula based on SCr of 1.72 mg/dL (H)). Liver Function Tests: Recent Labs  Lab 08/02/21 0232  AST 33  ALT 33  ALKPHOS 77  BILITOT 0.7  PROT 5.4*  ALBUMIN <1.5*   No results for input(s): LIPASE, AMYLASE in the last 168 hours. No results for input(s): AMMONIA in the last 168 hours. Coagulation Profile: No results for input(s): INR, PROTIME in the last 168 hours. Cardiac Enzymes: No results for input(s): CKTOTAL, CKMB, CKMBINDEX, TROPONINI in the last 168 hours. BNP (last 3 results) No results for input(s): PROBNP in the last  8760 hours. HbA1C: No results for input(s): HGBA1C in the last 72 hours. CBG: Recent Labs  Lab 08/03/21 2118 08/04/21 0735 08/04/21 1200 08/04/21 1409 08/04/21 1612  GLUCAP 213* 241* 181* 185* 112*   Lipid Profile: No results for input(s): CHOL, HDL, LDLCALC, TRIG, CHOLHDL, LDLDIRECT in the last 72 hours. Thyroid Function Tests: No results for input(s): TSH, T4TOTAL, FREET4, T3FREE, THYROIDAB in the last 72 hours. Anemia Panel: No results for input(s): VITAMINB12, FOLATE, FERRITIN, TIBC, IRON, RETICCTPCT in the last 72 hours. Urine analysis:    Component Value Date/Time   COLORURINE YELLOW 07/22/2021 0250   APPEARANCEUR CLOUDY (A) 07/22/2021 0250   LABSPEC 1.018 07/22/2021 0250   PHURINE 5.0 07/22/2021 0250   GLUCOSEU >=500 (A) 07/22/2021 0250   HGBUR SMALL (A) 07/22/2021 0250   BILIRUBINUR  NEGATIVE 07/22/2021 0250   KETONESUR 5 (A) 07/22/2021 0250   PROTEINUR 30 (A) 07/22/2021 0250   UROBILINOGEN 0.2 09/13/2019 0906   NITRITE NEGATIVE 07/22/2021 0250   LEUKOCYTESUR TRACE (A) 07/22/2021 0250   No results found for this or any previous visit (from the past 240 hour(s)).    Radiology Studies: No results found.  Scheduled Meds:  acetaminophen  1,000 mg Oral TID   amiodarone  200 mg Oral BID   [START ON 08/06/2021] amoxicillin  1,000 mg Oral Q8H   apixaban  5 mg Oral BID   vitamin C  1,000 mg Oral Daily   docusate sodium  100 mg Oral Daily   feeding supplement (GLUCERNA SHAKE)  237 mL Oral TID BM   gabapentin  300 mg Oral TID   hydrALAZINE  25 mg Oral Q8H   insulin aspart  0-15 Units Subcutaneous TID WC   insulin aspart  0-5 Units Subcutaneous QHS   insulin aspart  5 Units Subcutaneous TID WC   insulin detemir  25 Units Subcutaneous BID   isosorbide mononitrate  30 mg Oral Daily   lidocaine  2 patch Transdermal Q24H   metoprolol succinate  50 mg Oral Daily   multivitamin with minerals  1 tablet Oral Daily   nutrition supplement (JUVEN)  1 packet Oral BID BM    oxyCODONE  15 mg Oral Q12H   polyethylene glycol  17 g Oral Daily   Ensure Max Protein  11 oz Oral Daily   torsemide  20 mg Oral Daily   zinc sulfate  220 mg Oral Daily   Continuous Infusions:  sodium chloride 10 mL/hr at 07/29/21 0816   lactated ringers Stopped (07/23/21 1013)   penicillin g continuous IV infusion 12 Million Units (08/04/21 1629)     LOS: 13 days   Time spent: 25 minutes.  Patrecia Pour, MD [facility] [email or web address] 08/04/2021, 5:25 PM

## 2021-08-05 ENCOUNTER — Encounter (HOSPITAL_COMMUNITY): Payer: Self-pay | Admitting: Internal Medicine

## 2021-08-05 ENCOUNTER — Encounter (HOSPITAL_COMMUNITY)
Admission: EM | Disposition: A | Discharge status: Skilled Nursing Facility | Payer: Self-pay | Source: Home / Self Care | Attending: Internal Medicine

## 2021-08-05 ENCOUNTER — Inpatient Hospital Stay (HOSPITAL_COMMUNITY): Payer: Medicare HMO | Admitting: Certified Registered Nurse Anesthetist

## 2021-08-05 ENCOUNTER — Inpatient Hospital Stay (HOSPITAL_COMMUNITY): Payer: Medicare HMO

## 2021-08-05 DIAGNOSIS — I4892 Unspecified atrial flutter: Secondary | ICD-10-CM | POA: Diagnosis not present

## 2021-08-05 DIAGNOSIS — R079 Chest pain, unspecified: Secondary | ICD-10-CM | POA: Diagnosis not present

## 2021-08-05 DIAGNOSIS — I10 Essential (primary) hypertension: Secondary | ICD-10-CM | POA: Diagnosis not present

## 2021-08-05 DIAGNOSIS — Z89511 Acquired absence of right leg below knee: Secondary | ICD-10-CM | POA: Diagnosis not present

## 2021-08-05 DIAGNOSIS — N179 Acute kidney failure, unspecified: Secondary | ICD-10-CM | POA: Diagnosis not present

## 2021-08-05 DIAGNOSIS — E11628 Type 2 diabetes mellitus with other skin complications: Secondary | ICD-10-CM | POA: Diagnosis not present

## 2021-08-05 DIAGNOSIS — F418 Other specified anxiety disorders: Secondary | ICD-10-CM | POA: Diagnosis not present

## 2021-08-05 DIAGNOSIS — E785 Hyperlipidemia, unspecified: Secondary | ICD-10-CM | POA: Diagnosis not present

## 2021-08-05 DIAGNOSIS — M4644 Discitis, unspecified, thoracic region: Secondary | ICD-10-CM | POA: Diagnosis not present

## 2021-08-05 DIAGNOSIS — I5032 Chronic diastolic (congestive) heart failure: Secondary | ICD-10-CM | POA: Diagnosis not present

## 2021-08-05 DIAGNOSIS — L089 Local infection of the skin and subcutaneous tissue, unspecified: Secondary | ICD-10-CM | POA: Diagnosis not present

## 2021-08-05 DIAGNOSIS — A419 Sepsis, unspecified organism: Secondary | ICD-10-CM | POA: Diagnosis not present

## 2021-08-05 HISTORY — PX: RADIOLOGY WITH ANESTHESIA: SHX6223

## 2021-08-05 LAB — GLUCOSE, CAPILLARY
Glucose-Capillary: 165 mg/dL — ABNORMAL HIGH (ref 70–99)
Glucose-Capillary: 82 mg/dL (ref 70–99)
Glucose-Capillary: 66 mg/dL — ABNORMAL LOW (ref 70–99)
Glucose-Capillary: 103 mg/dL — ABNORMAL HIGH (ref 70–99)
Glucose-Capillary: 117 mg/dL — ABNORMAL HIGH (ref 70–99)
Glucose-Capillary: 389 mg/dL — ABNORMAL HIGH (ref 70–99)

## 2021-08-05 LAB — BASIC METABOLIC PANEL
Anion gap: 9 (ref 5–15)
BUN: 35 mg/dL — ABNORMAL HIGH (ref 6–20)
CO2: 30 mmol/L (ref 22–32)
Calcium: 8.3 mg/dL — ABNORMAL LOW (ref 8.9–10.3)
Chloride: 90 mmol/L — ABNORMAL LOW (ref 98–111)
Creatinine, Ser: 1.53 mg/dL — ABNORMAL HIGH (ref 0.61–1.24)
GFR, Estimated: 53 mL/min — ABNORMAL LOW (ref >60)
Glucose, Bld: 208 mg/dL — ABNORMAL HIGH (ref 70–99)
Potassium: 4.4 mmol/L (ref 3.5–5.1)
Sodium: 129 mmol/L — ABNORMAL LOW (ref 135–145)

## 2021-08-05 SURGERY — MRI WITH ANESTHESIA
Anesthesia: General

## 2021-08-05 MED ORDER — PENICILLIN G POTASSIUM 20000000 UNITS IJ SOLR
12.0000 10*6.[IU] | Freq: Two times a day (BID) | INTRAVENOUS | Status: AC
  Administered 2021-08-06: 12 10*6.[IU] via INTRAVENOUS
  Filled 2021-08-05: qty 12

## 2021-08-05 MED ORDER — FENTANYL CITRATE (PF) 100 MCG/2ML IJ SOLN: 25.0000 ug | INTRAMUSCULAR | Status: DC | PRN

## 2021-08-05 MED ORDER — EPHEDRINE SULFATE-NACL 50-0.9 MG/10ML-% IV SOSY
PREFILLED_SYRINGE | INTRAVENOUS | Status: DC | PRN
  Administered 2021-08-05: 10 mg via INTRAVENOUS
  Administered 2021-08-05: 15 mg via INTRAVENOUS

## 2021-08-05 MED ORDER — DEXTROSE 50 % IV SOLN
INTRAVENOUS | Status: AC
  Filled 2021-08-05: qty 50

## 2021-08-05 MED ORDER — FENTANYL CITRATE (PF) 250 MCG/5ML IJ SOLN
INTRAMUSCULAR | Status: DC | PRN
  Administered 2021-08-05 (×2): 50 ug via INTRAVENOUS

## 2021-08-05 MED ORDER — CHLORHEXIDINE GLUCONATE 0.12 % MT SOLN
15.0000 mL | Freq: Once | OROMUCOSAL | Status: AC
  Administered 2021-08-05: 15 mL via OROMUCOSAL

## 2021-08-05 MED ORDER — PHENYLEPHRINE 40 MCG/ML (10ML) SYRINGE FOR IV PUSH (FOR BLOOD PRESSURE SUPPORT)
PREFILLED_SYRINGE | INTRAVENOUS | Status: DC | PRN
  Administered 2021-08-05: 120 ug via INTRAVENOUS
  Administered 2021-08-05: 200 ug via INTRAVENOUS
  Administered 2021-08-05 (×2): 80 ug via INTRAVENOUS
  Administered 2021-08-05: 200 ug via INTRAVENOUS
  Administered 2021-08-05: 80 ug via INTRAVENOUS

## 2021-08-05 MED ORDER — GLYCOPYRROLATE PF 0.2 MG/ML IJ SOSY
PREFILLED_SYRINGE | INTRAMUSCULAR | Status: DC | PRN
  Administered 2021-08-05: .2 mg via INTRAVENOUS

## 2021-08-05 MED ORDER — DEXAMETHASONE SODIUM PHOSPHATE 10 MG/ML IJ SOLN
INTRAMUSCULAR | Status: DC | PRN
  Administered 2021-08-05: 10 mg via INTRAVENOUS

## 2021-08-05 MED ORDER — LACTATED RINGERS IV SOLN: INTRAVENOUS | Status: DC

## 2021-08-05 MED ORDER — PROMETHAZINE HCL 25 MG/ML IJ SOLN: 6.2500 mg | INTRAMUSCULAR | Status: DC | PRN

## 2021-08-05 MED ORDER — LIDOCAINE 2% (20 MG/ML) 5 ML SYRINGE
INTRAMUSCULAR | Status: DC | PRN
  Administered 2021-08-05: 100 mg via INTRAVENOUS

## 2021-08-05 MED ORDER — MEPERIDINE HCL 25 MG/ML IJ SOLN: 6.2500 mg | INTRAMUSCULAR | Status: DC | PRN

## 2021-08-05 MED ORDER — ONDANSETRON HCL 4 MG/2ML IJ SOLN
INTRAMUSCULAR | Status: DC | PRN
  Administered 2021-08-05: 4 mg via INTRAVENOUS

## 2021-08-05 MED ORDER — PROPOFOL 10 MG/ML IV BOLUS
INTRAVENOUS | Status: DC | PRN
  Administered 2021-08-05: 150 mg via INTRAVENOUS

## 2021-08-05 MED ORDER — AMIODARONE HCL 200 MG PO TABS
200.0000 mg | ORAL_TABLET | Freq: Every day | ORAL | Status: DC
  Administered 2021-08-06 – 2021-09-09 (×35): 200 mg via ORAL
  Filled 2021-08-05 (×35): qty 1

## 2021-08-05 MED ORDER — MIDAZOLAM HCL 2 MG/2ML IJ SOLN
INTRAMUSCULAR | Status: DC | PRN
  Administered 2021-08-05: 2 mg via INTRAVENOUS

## 2021-08-05 MED ORDER — DEXTROSE 50 % IV SOLN
25.0000 mL | Freq: Once | INTRAVENOUS | Status: AC
  Administered 2021-08-05: 25 mL via INTRAVENOUS

## 2021-08-05 MED ORDER — PHENYLEPHRINE HCL-NACL 20-0.9 MG/250ML-% IV SOLN
INTRAVENOUS | Status: DC | PRN
  Administered 2021-08-05: 50 ug/min via INTRAVENOUS

## 2021-08-05 MED ORDER — ORAL CARE MOUTH RINSE: 15.0000 mL | Freq: Once | OROMUCOSAL | Status: AC

## 2021-08-05 MED ORDER — CHLORHEXIDINE GLUCONATE 0.12 % MT SOLN
OROMUCOSAL | Status: AC
  Filled 2021-08-05: qty 15

## 2021-08-05 NOTE — Anesthesia Postprocedure Evaluation (Signed)
Anesthesia Post Note  Patient: [name]  Procedure(s) Performed: MRI LUMBAR WITH AND WITHOUT; THORASIC SPINE WITH AND WITHOUT WITH ANESTHESIA     Patient location during evaluation: PACU Anesthesia Type: General Level of consciousness: sedated and patient cooperative Pain management: pain level controlled Vital Signs Assessment: post-procedure vital signs reviewed and stable Respiratory status: spontaneous breathing Cardiovascular status: stable Anesthetic complications: no   No notable events documented.  Last Vitals:  Vitals:   08/05/21 1410 08/05/21 1451  BP: 93/62 120/70  Pulse: 69 72  Resp: 18 18  Temp: 37.1 C 37.1 C  SpO2: 94% 96%    Last Pain:  Vitals:   08/05/21 1546  TempSrc:   PainSc: [place]

## 2021-08-05 NOTE — TOC Initial Note (Signed)
Transition of Care [facility]) - Initial/Assessment Note    Patient Details  Name: [name] MRN: [id number] Date of Birth: Apr 14, 1966  Transition of [facility]) CM/SW Contact:    Bethena Roys, RN Phone Number: 08/05/2021, 12:06 PM  Clinical Narrative: Case Manager spoke with the patient regarding disposition needs. Patient states he does not have a primary care provider and wants the Case Manager to call one of the Community Clinics-we discussed the [facility] and the patient is agreeable. Appointment will be scheduled and placed on the AVS. Patient states that he has a wheelchair at home; however, it is over five years-prior to discharge the patient will need an order for DME wheelchair for home. Patient is asking that the MD sign a form to get his bathroom shower renovated via insurance. Case Manager did discuss with the provider and the patient that the patient will need to submit the information to his PCP to initiate the paperwork. Inpatient Rehab Admissions Coordinator is following the patient for CIR. Case Manager will continue to follow for additional transition of care needs.                 Expected Discharge Plan: IP Rehab Facility Barriers to Discharge: Continued Medical Work up   Patient Goals and CMS Choice Patient states their goals for this hospitalization and ongoing recovery are:: to get some rehab   Choice offered to / list presented to : NA  Expected Discharge Plan and Services Expected Discharge Plan: [place] In-house Referral: Clinical Social Work Discharge Planning Services: CM Consult, Follow-up appt scheduled Post Acute Care Choice: IP Rehab Living arrangements for the past 2 months: Apartment                     Prior Living Arrangements/Services Living arrangements for the past 2 months: Apartment Lives with:: Self Patient language and need for interpreter reviewed:: Yes Do you feel safe going back to the  place where you live?: Yes      Need for Family Participation in Patient Care: No (Comment) Care giver support system in place?: No (comment) Current home services: DME (Patient states he has a wheelchair; however it is old >than 55 years old- aksing for a new one.) Criminal Activity/Legal Involvement Pertinent to Current Situation/Hospitalization: No - Comment as needed    Emotional Assessment Appearance:: Appears stated age Attitude/Demeanor/Rapport: Engaged Affect (typically observed): Appropriate Orientation: : Oriented to Situation, Oriented to  Time, Oriented to Self Alcohol / Substance Use: Not Applicable Psych Involvement: No (comment)  Admission diagnosis:  Septic shock ([place]) [A41.9, R65.21] Sepsis ([place]) [A41.9] Severe sepsis with acute organ dysfunction ([place]) [A41.9, R65.20] Patient Active Problem List   Diagnosis Date Noted   Discitis of thoracic region    MSSA bacteremia    Pressure injury of skin 51/76/1607   Systolic dysfunction    Severe sepsis with acute organ dysfunction ([place]) 07/22/2021   Chest pain 07/22/2021   AKI (acute kidney injury) ([name]) 07/22/2021   Gas gangrene of foot ([place]) 07/22/2021   Atrial flutter ([place]) 07/22/2021   Septic shock ([place]) 07/22/2021   Shortness of breath 05/16/2021   Chronic diastolic (congestive) heart failure ([place]) 05/16/2021   Diabetic infection of left foot ([place]) 04/17/2021   Right below-knee amputee ([place]) 10/30/2019   Dyslipidemia 10/30/2019   Type 2 diabetes mellitus with diabetic polyneuropathy, with long-term current use of insulin ([place]) 10/12/2019   Type 2 diabetes mellitus with stage 3a chronic kidney disease,  without long-term current use of insulin ([place]) 10/12/2019   Essential hypertension 09/25/2019   Type 2 diabetes mellitus with hyperglycemia, with long-term current use of insulin ([place]) 09/25/2019   Stage 3a chronic kidney disease ([place]) 09/25/2019   Hematuria 09/25/2019   Acquired complex renal cyst 09/25/2019    Urinary hesitancy 09/25/2019   Prosthesis adjustments 09/25/2019   Need for immunization against influenza 09/25/2019   Need for Tdap vaccination 09/25/2019   Erectile dysfunction 09/25/2019   Healthcare maintenance 09/25/2019   Pulmonary nodules 09/25/2019   Depression with anxiety 09/25/2019   PCP:  Patient, No Pcp Per (Inactive) Pharmacy:   [facility] [place] [place] - [address] [place] ([place]) [place] [address] Phone: [phone] Fax: [phone]  Readmission Risk Interventions No flowsheet data found.

## 2021-08-05 NOTE — Progress Notes (Signed)
Unable to achieve diagnostic images due to multiple factors. Pt's exams were started on a 3T magnet and due to pleural effusions, cardiomegaly, and other collections of fluid causing signal voids, images were sent to a Radiologist who decided to terminate the exam and continue on a 1.5T magnet. At the time there were no magnets available to accomplish this and we were forced to discontinue.

## 2021-08-05 NOTE — Transfer of Care (Signed)
Immediate Anesthesia Transfer of Care Note  Patient: [name]  Procedure(s) Performed: MRI LUMBAR WITH AND WITHOUT; THORASIC SPINE WITH AND WITHOUT WITH ANESTHESIA  Patient Location: PACU  Anesthesia Type:General  Level of Consciousness: drowsy and responds to stimulation  Airway & Oxygen Therapy: Patient Spontanous Breathing and Patient connected to face mask oxygen  Post-op Assessment: Report given to RN and Post -op Vital signs reviewed and stable  Post vital signs: Reviewed and stable  Last Vitals:  Vitals Value Taken Time  BP    Temp    Pulse    Resp    SpO2      Last Pain:  Vitals:   08/05/21 1116  TempSrc: Oral  PainSc:       Patients Stated Pain Goal: 0 (37/34/28 7681)  Complications: No notable events documented.

## 2021-08-05 NOTE — Progress Notes (Signed)
OT Cancellation Note  Patient Details Name: Inman Fettig MRN: [id number] DOB: 1966-02-19   Cancelled Treatment:    Reason Eval/Treat Not Completed: Patient at procedure or test/ unavailable. Will follow and see as able.   Jolaine Artist, OT Acute Rehabilitation Services Pager [phone] Office [phone]   Delight Stare 08/05/2021, 11:42 AM

## 2021-08-05 NOTE — Anesthesia Preprocedure Evaluation (Addendum)
Anesthesia Evaluation  Patient identified by MRN, date of birth, ID band Patient awake    Reviewed: Allergy & Precautions, H&P , NPO status , Patient's Chart, lab work & pertinent test results, reviewed documented beta blocker date and time   Airway Mallampati: II  TM Distance: >3 FB Neck ROM: Full    Dental  (+) Poor Dentition, Dental Advisory Given, Chipped   Pulmonary shortness of breath,    Pulmonary exam normal breath sounds clear to auscultation       Cardiovascular Exercise Tolerance: Good hypertension, Pt. on medications and Pt. on home beta blockers +CHF  Normal cardiovascular exam Rhythm:Regular Rate:Normal  Echo 07/23/2021 IMPRESSIONS 1. Left ventricular ejection fraction, by estimation, is 35%. The left ventricle hasmoderately decreased function. The left ventricle demonstrates global hypokinesis. There is mild left ventricular hypertrophy. Left ventricular diastolic parameters areindeterminate. 2. Right ventricular systolic function is moderately reduced. The right ventricular size is normal. There is moderately elevated pulmonary artery systolic pressure. The estimated right ventricular systolic pressure is 89.1 mmHg. 3. Left atrial size was mildly dilated. 4. Right atrial size was mildly dilated. 5.The mitral valve is normal in structure. Trivial mitral valve regurgitation. No evidence of mitral stenosis. 6. The aortic valve is tricuspid. Aortic valve regurgitation is not visualized. No aortic stenosis is present. 7. Aortic dilatation noted. There is mild dilatation of the aortic root, measuring 39 mm. 8. The inferior vena cava is dilated in size with <50% respiratory variability, suggesting right atrial pressure of 15 mmHg. 9. No vegetation visualized, but if high suspicion would need TEE. 10. The patient was in atrial fibrillation.   Neuro/Psych Anxiety Depression negative neurological ROS     GI/Hepatic negative  GI ROS, Neg liver ROS,   Endo/Other  diabetes, Insulin Dependent  Renal/GU Renal disease  negative genitourinary   Musculoskeletal   Abdominal   Peds  Hematology negative hematology ROS (+)   Anesthesia Other Findings   Reproductive/Obstetrics negative OB ROS                           Anesthesia Physical  Anesthesia Plan  ASA: 4  Anesthesia Plan: General   Post-op Pain Management: Minimal or no pain anticipated   Induction: Intravenous  PONV Risk Score and Plan: 2 and Ondansetron, Dexamethasone, Treatment may vary due to age or medical condition and Midazolam  Airway Management Planned: LMA  Additional Equipment: None  Intra-op Plan:   Post-operative Plan: Extubation in OR  Informed Consent: I have reviewed the patients History and Physical, chart, labs and discussed the procedure including the risks, benefits and alternatives for the proposed anesthesia with the patient or authorized representative who has indicated his/her understanding and acceptance.     Dental advisory given  Plan Discussed with: CRNA  Anesthesia Plan Comments:       Anesthesia Quick Evaluation

## 2021-08-05 NOTE — Progress Notes (Addendum)
Progress Note  Patient Name: [name] Date of Encounter: 08/05/2021  [facility] Cardiologist: Sanda Klein, MD   Subjective   Has chronic back pain but today has left lower rib cage pain. Hoping to get MRI done today.   Inpatient Medications    Scheduled Meds:  acetaminophen  1,000 mg Oral TID   [START ON 08/06/2021] amiodarone  200 mg Oral Daily   [START ON 08/06/2021] amoxicillin  1,000 mg Oral Q8H   apixaban  5 mg Oral BID   vitamin C  1,000 mg Oral Daily   docusate sodium  100 mg Oral Daily   feeding supplement (GLUCERNA SHAKE)  237 mL Oral TID BM   gabapentin  300 mg Oral TID   hydrALAZINE  25 mg Oral Q8H   insulin aspart  0-15 Units Subcutaneous TID WC   insulin aspart  0-5 Units Subcutaneous QHS   insulin aspart  5 Units Subcutaneous TID WC   insulin detemir  25 Units Subcutaneous BID   isosorbide mononitrate  30 mg Oral Daily   lidocaine  2 patch Transdermal Q24H   metoprolol succinate  50 mg Oral Daily   multivitamin with minerals  1 tablet Oral Daily   nutrition supplement (JUVEN)  1 packet Oral BID BM   oxyCODONE  15 mg Oral Q12H   polyethylene glycol  17 g Oral Daily   Ensure Max Protein  11 oz Oral Daily   torsemide  20 mg Oral Daily   Continuous Infusions:  sodium chloride 10 mL/hr at 07/29/21 0816   lactated ringers Stopped (07/23/21 1013)   penicillin g continuous IV infusion     PRN Meds: alum & mag hydroxide-simeth, bisacodyl, diphenhydrAMINE, guaiFENesin-dextromethorphan, hydrALAZINE, HYDROmorphone (DILAUDID) injection, LORazepam, ondansetron, oxyCODONE, phenol, polyethylene glycol, traZODone   Vital Signs    Vitals:   08/04/21 1322 08/04/21 1954 08/05/21 0554 08/05/21 0934  BP: (!) 109/56 111/60 129/68 125/71  Pulse: 60 62 62 62  Resp: 19 17 18    Temp: 98.3 F (36.8 C) 98.1 F (36.7 C) 98.4 F (36.9 C)   TempSrc: Oral Oral Oral   SpO2: 94% 93% 97%   Weight:      Height:        Intake/Output Summary (Last 24 hours) at  08/05/2021 0958 Last data filed at 08/05/2021 0555 Gross per 24 hour  Intake --  Output 3150 ml  Net -3150 ml   Last 3 Weights 08/01/2021 07/29/2021 07/27/2021  Weight (lbs) 235 lb 14.3 oz 238 lb 8.6 oz 238 lb 8.6 oz  Weight (kg) 107 kg 108.2 kg 108.2 kg      Telemetry    NSR - Personally Reviewed  ECG    N/A  Physical Exam   GEN: No acute distress.   Neck: No JVD Cardiac: RRR, no murmurs, rubs, or gallops.  Respiratory: Clear to auscultation bilaterally. GI: Soft, nontender, non-distended  MS: S/p bilateral amputations Neuro:  Nonfocal  Psych: Normal affect   Labs    High Sensitivity Troponin:   Recent Labs  Lab 07/22/21 0246 07/22/21 0645  TROPONINIHS 52* 44*     Chemistry Recent Labs  Lab 08/01/21 1634 08/02/21 0232 08/03/21 0204 08/04/21 0221 08/05/21 0214  NA  --  127* 129* 131* 129*  K  --  3.9 3.8 4.0 4.4  CL  --  84* 89* 91* 90*  CO2  --  35* 34* 34* 30  GLUCOSE  --  243* 262* 267* 208*  BUN  --  38*  43* 37* 35*  CREATININE  --  1.77* 1.81* 1.72* 1.53*  CALCIUM  --  8.3* 8.2* 8.4* 8.3*  MG 1.9 1.7 1.8  --   --   PROT  --  5.4*  --   --   --   ALBUMIN  --  <1.5*  --   --   --   AST  --  33  --   --   --   ALT  --  33  --   --   --   ALKPHOS  --  77  --   --   --   BILITOT  --  0.7  --   --   --   GFRNONAA  --  45* 44* 46* 53*  ANIONGAP  --  8 6 6 9     Lipids No results for input(s): CHOL, TRIG, HDL, LABVLDL, LDLCALC, CHOLHDL in the last 168 hours.  Hematology Recent Labs  Lab 07/31/21 0130 08/01/21 0259 08/04/21 0221  WBC 14.8* 13.9* 11.9*  RBC 4.88 4.82 4.37  HGB 14.0 13.5 12.4*  HCT 41.4 41.5 38.8*  MCV 84.8 86.1 88.8  MCH 28.7 28.0 28.4  MCHC 33.8 32.5 32.0  RDW 15.5 15.2 14.9  PLT 318 349 386     Radiology    No results found.  Cardiac Studies   TTE 07/23/2021   1. Left ventricular ejection fraction, by estimation, is 35%. The left  ventricle has moderately decreased function. The left ventricle  demonstrates  global hypokinesis. There is mild left ventricular  hypertrophy. Left ventricular diastolic parameters are   indeterminate.   2. Right ventricular systolic function is moderately reduced. The right  ventricular size is normal. There is moderately elevated pulmonary artery  systolic pressure. The estimated right ventricular systolic pressure is  41.9 mmHg.   3. Left atrial size was mildly dilated.   4. Right atrial size was mildly dilated.   5. The mitral valve is normal in structure. Trivial mitral valve  regurgitation. No evidence of mitral stenosis.   6. The aortic valve is tricuspid. Aortic valve regurgitation is not  visualized. No aortic stenosis is present.   7. Aortic dilatation noted. There is mild dilatation of the aortic root,  measuring 39 mm.   8. The inferior vena cava is dilated in size with <50% respiratory  variability, suggesting right atrial pressure of 15 mmHg.   9. No vegetation visualized, but if high suspicion would need TEE.  10. The patient was in atrial fibrillation.   Patient Profile     55 y.o. male with heart failure with preserved ejection fraction, uncontrolled diabetes, CKD stage III who was admitted on 07/22/2021 with sepsis secondary to left lower extremity osteomyelitis.  Cardiology was consulted due to new onset atrial flutter as well as new onset systolic heart failure.  Initially admitted to the ICU with sepsis due to diabetic foot infection of the left lower extremity.  He is status post left transtibial amputation.  Blood cultures were positive for Streptococcus organism.  Assessment & Plan    New onset systolic CHF - LVEF 37% on echo. Felt this is due to arrhythmias vs sepsis. Underwent TEE/DCCV. - Net I & O -15.5L.  - Not a candidate for ACE or/ARB/Arni/MRA given significant kidney dysfunction - Continue Toprol XL 50mg  daily - Continue hydralazine 25mg  TID and Imdur 30mg  qd for afterload reduction - Continue Torsemide 20mg  qd  - Per prior  note "Overall given the fact that he is  here with sepsis and bacteremia as well as recent leg amputation would recommend medical management of his cardiomyopathy.  Suspect he has some underlying CAD but for now would recommend medical therapy"  2. New onset Atrial flutter - S/p TEE/DCCV on 07/29/2021 - Maintaining sinus rhythm - Continue Eliquis 5mg  BID (need to be uninterrupted for least 1 month after cardioversion) - Reviewed with pharmacist, pt will complete 8gm of amiodarone today>> change Amiodarone 200mg  tomorrow  3. Bacteremia - Pending MRI today     For questions or updates, please contact [place] Please consult [email or web address] for contact info under        SignedLeanor Kail, PA  08/05/2021, 9:58 AM     Personally seen and examined. Agree with APP above with the following comments: Briefly 55 yO M with sepsis vs tachy medicated cardiomyopathy AKI has limited GDMT, but we have maximally titrated his GDMT Patient notes that his back pain is the biggest barrier to his care today Exam notable for regular rhythm, CTAB, amputation sites are C/D/I Personally reviewed relevant tests; Creatinine continues to improve Would recommend  - continue HF and after load therapy, continue rhythm control stratgey - based on kidney recovery and follow up imagine, outpatient ischemic work up would be reasonable - likely SO 08/06/21; this seems to be the best diuretic regimen for him   For questions or updates, please contact [place] Please consult [email or web address] for contact info under Cardiology/STEMI.      Signed, Werner Lean, MD  08/05/2021, 12:06 PM

## 2021-08-05 NOTE — Progress Notes (Signed)
PROGRESS NOTE  [name]  TSV:[id number] DOB: 09/15/1965 DOA: 07/21/2021 PCP: Patient, No Pcp Per (Inactive)   Brief Narrative: 55 year old male with history of diabetes mellitus type 2, diabetic foot infection with prior right BKA, chronic diastolic CHF, hypertension, dyslipidemia presented with worsening left foot swelling/discoloration and discharge.  On presentation, he was hypotensive, tachycardic with A. fib with RVR with CT suggestive of first MTP osteomyelitis and soft tissue gas concerning for possible necrotizing fasciitis along with AKI with creatinine of 2.7 and mild DKA.  His blood pressures did not improve with IV fluids and he was transferred to ICU and PCCM was consulted.  Orthopedics was consulted.  He underwent left transtibial amputation on 07/23/2021.  He was found to have strep agalactiae bacteremia.  ID was consulted.  He was found to have vertebral discitis versus osteomyelitis on MRI.  He was also found to have new systolic dysfunction with LVEF of 35% along with new onset atrial flutter requiring IV amiodarone which has been switched to oral.  Cardiology was consulted.  He has been started on Eliquis.  S/p TEE followed by DCCV on 11/22.  Developed worsening back pain 11/26-11/27, ID reconsulted, requested MRI T/L-spine with contrast. This requires general anesthesia and was performed 11/29. Unfortunately diagnostic images could not be obtained.   Assessment & Plan: Principal Problem:   Discitis of thoracic region Active Problems:   Essential hypertension   Stage 3a chronic kidney disease (HCC)   Type 2 diabetes mellitus with diabetic polyneuropathy, with long-term current use of insulin (HCC)   Right below-knee amputee (HCC)   Diabetic infection of left foot (HCC)   Chronic diastolic (congestive) heart failure (HCC)   Severe sepsis with acute organ dysfunction (HCC)   Chest pain   AKI (acute kidney injury) ([place])   Gas gangrene of foot (HCC)   Atrial flutter (HCC)    Septic shock (HCC)   Systolic dysfunction   Pressure injury of skin   MSSA bacteremia  Septic shock: Evolving on admission from bacteremia/diabetic foot infection -Off pressors.  Shock resolved. -Required care in ICU under PCCM service: Care has been transferred to [facility] from 07/27/2021 onwards   Diabetic foot infection, left: Status post left transtibial amputation on 07/23/2021. - Wound/wound VAC care as per Dr. Sharol Given.    Group B streptococcus bacteremia, Possible thoracic discitis/osteomyelitis. No vegetation on TTE or TEE during cardioversion.  MRI of cervical/thoracic/lumbar spine showed possible thoracic osteomyelitis/discitis without evidence of abscess but MRI was without contrast.   - Neurosurgery evaluation appreciated: Not convinced that patient has epidural abscess.  Recommended to manage conservatively with IV antibiotics. -Antibiotics have been changed to penicillin.  Patient will probably need 8 weeks of antibiotics per ID (2 weeks of parenteral therapy with penicillin from 07/23/2021-08/05/2021; then transition to amoxicillin 1 g 3 times daily till 09/14/2021). -Repeat blood cultures from 07/24/2021 were negative -Patient developed worsening of back pain 11/27. ID reconsulted and recommended MRI for recharacterization to inform decision making. Required anesthesia on 11/29 though apparently requires a 1.5T magnet to obtain adequate images which was not available at the time, so MRI was discontinued.   AKI on stage IIIa CKD: AKI resolving with limitation on diuresis. Had coronary CTA done earlier this year, no implanted devices. Discussed risk of NSF to which the patient confirms understanding and gives consent to proceed with contrasted MRI.  - Will monitor in AM. Cr improving.  Pt takes testosterone (not prescription):  - Check level (total, free)   Hyponatremia: Improved.  New onset systolic CHF: Echo showed EF of 35% (previous echo showed EF of 50 to 55% in 04/2021).  Cardiomyopathy may be from new onset atrial flutter RVR related tachycardia versus stress induced cardiomyopathy from bacteremia and septic shock. - After IV diuresis, then holding for AKI, cardiology has settled on torsemide 20mg  daily for now. - Continue metop succinate, bidil. AKI limiting ACE/ARB/ARNI and MRA.  New onset atrial flutter: Maintained NSR since DCCV 11/22 - Continue metoprolol succinate 50 Mg daily. - Continue amiodarone 200mg  twice daily x7 days and then 200 Mg daily. - Continue eliquis 5 Mg twice daily. MUST remain on this for 1 month post DCCV.   Hypomagnesemia: Improved with supplementation.  - Follow periodically.   Leukocytosis: Continues improving.    T2DM uncontrolled with hyperglycemia:  - Increased Levemir to 25 units twice daily, for now continue NovoLog 5 units 3 times daily with meals and SSI.  Further titrate insulins as needed. -Counseled patient regarding importance of dietary restrictions.  Thrombocytopenia - Resolved   Generalized deconditioning - PT recommends CIR which will be pursued once work up has been completed.    Acute on chronic pain: Follows with Dr. Nelva Bush, pain management.  Has received steroid injections to the back.  - Reviewed [place] PDMP and patient regularly fills opioid prescriptions. Likely opioid-dependent. - Obviously has reasons for acute pain from recent surgery, discitis/osteomyelitis. - Initiated OxyContin 10 Mg every 12 hours on 11/23.  This was increased to 15 mg every 12 hours on 11/27 - Evaluating etiology for worsening back pain as noted above.   Patient on following regimen after making changes: - Tylenol 1 g 3 times daily. - Oxy IR 10 mg every 4 hourly as needed for moderate pain - OxyContin 15 Mg every 12 hours - Lidocaine patch increased to 2 patches - Dilaudid 0.5 mg IV every 4 hourly as needed severe pain - Gabapentin 300 mg 3 times daily   Insomnia - Started trazodone 50 Mg at bedtime as needed on 11/24 with  good effect, continue.  RN Pressure Injury Documentation: Pressure Injury 07/27/21 Sacrum Mid Stage 2 -  Partial thickness loss of dermis presenting as a shallow open injury with a red, pink wound bed without slough. small pink pressure ulcer with break in skin (Active)  07/27/21 0255  Location: Sacrum  Location Orientation: Mid  Staging: Stage 2 -  Partial thickness loss of dermis presenting as a shallow open injury with a red, pink wound bed without slough.  Wound Description (Comments): small pink pressure ulcer with break in skin  Present on Admission: Yes   DVT prophylaxis: Eliquis Code Status: Full Family Communication: None at bedside Disposition Plan:  Status is: Inpatient  Remains inpatient appropriate because: Requires ongoing work up with MRI, ID recommendations, then would pursue rehabilitation. CM has arranged PCP.  Consultants:  ID Neurosurgery PCCM Cardiology  Procedures:  Left TTA 07/23/2021 TERE/DCCV 07/29/2021  Antimicrobials: Cefazolin 11/16 Ceftriaxone 11/14, 11/16-17 Clindamycin 11/15-16 Linezolid 11/15 Pen G 11/18-p Pip-tazo 11/15-11/16 Vancomycin 11/14  Subjective: Pain in left back/abdomen with certain movements today, still severe constant and waxing/waning pain in the midline throughout the back today. No fevers. No new weakness or numbness. Having regular BMs.  Objective: Vitals:   08/05/21 1340 08/05/21 1355 08/05/21 1410 08/05/21 1451  BP: (!) 88/54 (!) 89/56 93/62 120/70  Pulse: 71 70 69 72  Resp: 14 18 18 18   Temp:   98.8 F (37.1 C) 98.8 F (37.1 C)  TempSrc:  Axillary  SpO2: 92% 94% 94% 96%  Weight:      Height:        Intake/Output Summary (Last 24 hours) at 08/05/2021 1639 Last data filed at 08/05/2021 1400 Gross per 24 hour  Intake 700 ml  Output 2700 ml  Net -2000 ml   Filed Weights   07/27/21 0252 07/29/21 0754 08/01/21 0459  Weight: 108.2 kg 108.2 kg 107 kg   Gen: 55 y.o. male in no distress Pulm: Nonlabored  breathing room air. Clear. CV: Regular rate and rhythm. No murmur, rub, or gallop. No JVD, no dependent edema. GI: Abdomen soft, no palpable or visible abnormality in LUQ, nontender, non-distended, with normoactive bowel sounds.  Ext: Warm, amputations stable, wound vac in place LLE. Skin: No new rashes, lesions or ulcers on visualized skin. Neuro: Alert and oriented. No focal neurological deficits. Psych: Judgement and insight appear fair. Mood euthymic & affect congruent. Behavior is appropriate.    Data Reviewed: I have personally reviewed following labs and imaging studies  CBC: Recent Labs  Lab 07/30/21 0144 07/31/21 0130 08/01/21 0259 08/04/21 0221  WBC 15.0* 14.8* 13.9* 11.9*  HGB 13.6 14.0 13.5 12.4*  HCT 40.2 41.4 41.5 38.8*  MCV 84.8 84.8 86.1 88.8  PLT 297 318 349 161   Basic Metabolic Panel: Recent Labs  Lab 07/31/21 0130 08/01/21 0259 08/01/21 1634 08/02/21 0232 08/03/21 0204 08/04/21 0221 08/05/21 0214  NA 128* 129*  --  127* 129* 131* 129*  K 4.3 4.2  --  3.9 3.8 4.0 4.4  CL 91* 89*  --  84* 89* 91* 90*  CO2 29 33*  --  35* 34* 34* 30  GLUCOSE 282* 286*  --  243* 262* 267* 208*  BUN 28* 29*  --  38* 43* 37* 35*  CREATININE 1.40* 1.52*  --  1.77* 1.81* 1.72* 1.53*  CALCIUM 8.0* 8.1*  --  8.3* 8.2* 8.4* 8.3*  MG 1.6* 1.5* 1.9 1.7 1.8  --   --    GFR: Estimated Creatinine Clearance: 72.1 mL/min (A) (by C-G formula based on SCr of 1.53 mg/dL (H)). Liver Function Tests: Recent Labs  Lab 08/02/21 0232  AST 33  ALT 33  ALKPHOS 77  BILITOT 0.7  PROT 5.4*  ALBUMIN <1.5*   No results for input(s): LIPASE, AMYLASE in the last 168 hours. No results for input(s): AMMONIA in the last 168 hours. Coagulation Profile: No results for input(s): INR, PROTIME in the last 168 hours. Cardiac Enzymes: No results for input(s): CKTOTAL, CKMB, CKMBINDEX, TROPONINI in the last 168 hours. BNP (last 3 results) No results for input(s): PROBNP in the last 8760  hours. HbA1C: No results for input(s): HGBA1C in the last 72 hours. CBG: Recent Labs  Lab 08/05/21 0735 08/05/21 1121 08/05/21 1322 08/05/21 1347 08/05/21 1544  GLUCAP 165* 82 66* 103* 117*   Lipid Profile: No results for input(s): CHOL, HDL, LDLCALC, TRIG, CHOLHDL, LDLDIRECT in the last 72 hours. Thyroid Function Tests: No results for input(s): TSH, T4TOTAL, FREET4, T3FREE, THYROIDAB in the last 72 hours. Anemia Panel: No results for input(s): VITAMINB12, FOLATE, FERRITIN, TIBC, IRON, RETICCTPCT in the last 72 hours. Urine analysis:    Component Value Date/Time   COLORURINE YELLOW 07/22/2021 0250   APPEARANCEUR CLOUDY (A) 07/22/2021 0250   LABSPEC 1.018 07/22/2021 0250   PHURINE 5.0 07/22/2021 0250   GLUCOSEU >=500 (A) 07/22/2021 0250   HGBUR SMALL (A) 07/22/2021 0250   BILIRUBINUR NEGATIVE 07/22/2021 0250   KETONESUR 5 (A) 07/22/2021  [place] (A) 07/22/2021 0250   UROBILINOGEN 0.2 09/13/2019 0906   NITRITE NEGATIVE 07/22/2021 0250   LEUKOCYTESUR TRACE (A) 07/22/2021 0250   No results found for this or any previous visit (from the past 240 hour(s)).    Radiology Studies: No results found.  Scheduled Meds:  acetaminophen  1,000 mg Oral TID   [START ON 08/06/2021] amiodarone  200 mg Oral Daily   [START ON 08/06/2021] amoxicillin  1,000 mg Oral Q8H   apixaban  5 mg Oral BID   vitamin C  1,000 mg Oral Daily   docusate sodium  100 mg Oral Daily   feeding supplement (GLUCERNA SHAKE)  237 mL Oral TID BM   gabapentin  300 mg Oral TID   hydrALAZINE  25 mg Oral Q8H   insulin aspart  0-15 Units Subcutaneous TID WC   insulin aspart  0-5 Units Subcutaneous QHS   insulin aspart  5 Units Subcutaneous TID WC   insulin detemir  25 Units Subcutaneous BID   isosorbide mononitrate  30 mg Oral Daily   lidocaine  2 patch Transdermal Q24H   metoprolol succinate  50 mg Oral Daily   multivitamin with minerals  1 tablet Oral Daily   nutrition supplement (JUVEN)  1 packet  Oral BID BM   oxyCODONE  15 mg Oral Q12H   polyethylene glycol  17 g Oral Daily   Ensure Max Protein  11 oz Oral Daily   torsemide  20 mg Oral Daily   Continuous Infusions:  sodium chloride 10 mL/hr at 07/29/21 0816   lactated ringers Stopped (07/23/21 1013)   penicillin g continuous IV infusion       LOS: 14 days   Time spent: 25 minutes.  Patrecia Pour, MD [facility] [email or web address] 08/05/2021, 4:39 PM

## 2021-08-06 ENCOUNTER — Encounter (HOSPITAL_COMMUNITY): Payer: Self-pay | Admitting: Radiology

## 2021-08-06 DIAGNOSIS — M4644 Discitis, unspecified, thoracic region: Secondary | ICD-10-CM | POA: Diagnosis not present

## 2021-08-06 LAB — BASIC METABOLIC PANEL
Anion gap: 8 (ref 5–15)
BUN: 43 mg/dL — ABNORMAL HIGH (ref 6–20)
CO2: 28 mmol/L (ref 22–32)
Calcium: 8.1 mg/dL — ABNORMAL LOW (ref 8.9–10.3)
Chloride: 92 mmol/L — ABNORMAL LOW (ref 98–111)
Creatinine, Ser: 1.72 mg/dL — ABNORMAL HIGH (ref 0.61–1.24)
GFR, Estimated: 46 mL/min — ABNORMAL LOW (ref >60)
Glucose, Bld: 378 mg/dL — ABNORMAL HIGH (ref 70–99)
Potassium: 4.8 mmol/L (ref 3.5–5.1)
Sodium: 128 mmol/L — ABNORMAL LOW (ref 135–145)

## 2021-08-06 LAB — GLUCOSE, CAPILLARY
Glucose-Capillary: 271 mg/dL — ABNORMAL HIGH (ref 70–99)
Glucose-Capillary: 331 mg/dL — ABNORMAL HIGH (ref 70–99)
Glucose-Capillary: 233 mg/dL — ABNORMAL HIGH (ref 70–99)
Glucose-Capillary: 240 mg/dL — ABNORMAL HIGH (ref 70–99)

## 2021-08-06 LAB — CBC WITH DIFFERENTIAL/PLATELET
Abs Immature Granulocytes: 0.15 10*3/uL — ABNORMAL HIGH (ref 0.00–0.07)
Basophils Absolute: 0 10*3/uL (ref 0.0–0.1)
Basophils Relative: 0 %
Eosinophils Absolute: 0 10*3/uL (ref 0.0–0.5)
Eosinophils Relative: 0 %
HCT: 38.2 % — ABNORMAL LOW (ref 39.0–52.0)
Hemoglobin: 12.4 g/dL — ABNORMAL LOW (ref 13.0–17.0)
Immature Granulocytes: 1 %
Lymphocytes Relative: 3 %
Lymphs Abs: 0.3 10*3/uL — ABNORMAL LOW (ref 0.7–4.0)
MCH: 28.9 pg (ref 26.0–34.0)
MCHC: 32.5 g/dL (ref 30.0–36.0)
MCV: 89 fL (ref 80.0–100.0)
Monocytes Absolute: 0.4 10*3/uL (ref 0.1–1.0)
Monocytes Relative: 4 %
Neutro Abs: 9.9 10*3/uL — ABNORMAL HIGH (ref 1.7–7.7)
Neutrophils Relative %: 92 %
Platelets: 413 10*3/uL — ABNORMAL HIGH (ref 150–400)
RBC: 4.29 MIL/uL (ref 4.22–5.81)
RDW: 14.6 % (ref 11.5–15.5)
WBC: 10.8 10*3/uL — ABNORMAL HIGH (ref 4.0–10.5)
nRBC: 0 % (ref 0.0–0.2)

## 2021-08-06 LAB — TESTOSTERONE: Testosterone: 195 ng/dL — ABNORMAL LOW (ref 264–916)

## 2021-08-06 MED ORDER — INSULIN DETEMIR 100 UNIT/ML ~~LOC~~ SOLN
28.0000 [IU] | Freq: Two times a day (BID) | SUBCUTANEOUS | Status: DC
  Administered 2021-08-06 – 2021-08-11 (×10): 28 [IU] via SUBCUTANEOUS
  Filled 2021-08-06 (×11): qty 0.28

## 2021-08-06 MED ORDER — METHOCARBAMOL 500 MG PO TABS
500.0000 mg | ORAL_TABLET | Freq: Three times a day (TID) | ORAL | Status: DC
Start: 2021-08-06 — End: 2021-08-07
  Administered 2021-08-06 – 2021-08-07 (×5): 500 mg via ORAL
  Filled 2021-08-06 (×5): qty 1

## 2021-08-06 MED ORDER — LORAZEPAM 2 MG/ML IJ SOLN: 1.0000 mg | Freq: Once | INTRAMUSCULAR | Status: DC

## 2021-08-06 MED ORDER — DEXTROSE 5 % IV SOLN
12.0000 10*6.[IU] | Freq: Two times a day (BID) | INTRAVENOUS | Status: DC
  Administered 2021-08-06 (×2): 12 10*6.[IU] via INTRAVENOUS
  Filled 2021-08-06 (×4): qty 12

## 2021-08-06 MED ORDER — INSULIN ASPART 100 UNIT/ML IJ SOLN
8.0000 [IU] | Freq: Three times a day (TID) | INTRAMUSCULAR | Status: DC
  Administered 2021-08-07 – 2021-08-08 (×3): 8 [IU] via SUBCUTANEOUS

## 2021-08-06 MED ORDER — LORAZEPAM 2 MG/ML IJ SOLN
1.0000 mg | Freq: Once | INTRAMUSCULAR | Status: AC
  Administered 2021-08-07: 1 mg via INTRAVENOUS
  Filled 2021-08-06: qty 1

## 2021-08-06 NOTE — Progress Notes (Signed)
Inpatient Rehab Admissions Coordinator:   Continuing to follow for workup of back pain.   Shann Medal, PT, DPT Admissions Coordinator [phone] 08/06/21  12:35 PM

## 2021-08-06 NOTE — Progress Notes (Signed)
Progress Note  Patient Name: [name] Date of Encounter: 08/06/2021  [facility] Cardiologist: Sanda Klein, MD   Subjective   MRI attempted but unable to get diagnostic images.  Patient notes that he feels disappointed and hopes he is not charged for this.  Inpatient Medications    Scheduled Meds:  acetaminophen  1,000 mg Oral TID   amiodarone  200 mg Oral Daily   apixaban  5 mg Oral BID   vitamin C  1,000 mg Oral Daily   docusate sodium  100 mg Oral Daily   feeding supplement (GLUCERNA SHAKE)  237 mL Oral TID BM   gabapentin  300 mg Oral TID   hydrALAZINE  25 mg Oral Q8H   insulin aspart  0-15 Units Subcutaneous TID WC   insulin aspart  0-5 Units Subcutaneous QHS   insulin aspart  5 Units Subcutaneous TID WC   insulin detemir  25 Units Subcutaneous BID   isosorbide mononitrate  30 mg Oral Daily   lidocaine  2 patch Transdermal Q24H   [START ON 08/07/2021] LORazepam  1 mg Intravenous Once   methocarbamol  500 mg Oral TID   metoprolol succinate  50 mg Oral Daily   multivitamin with minerals  1 tablet Oral Daily   nutrition supplement (JUVEN)  1 packet Oral BID BM   oxyCODONE  15 mg Oral Q12H   polyethylene glycol  17 g Oral Daily   Ensure Max Protein  11 oz Oral Daily   torsemide  20 mg Oral Daily   Continuous Infusions:  sodium chloride 10 mL/hr at 07/29/21 0816   lactated ringers Stopped (07/23/21 1013)   penicillin g continuous IV infusion     PRN Meds: alum & mag hydroxide-simeth, bisacodyl, diphenhydrAMINE, guaiFENesin-dextromethorphan, hydrALAZINE, HYDROmorphone (DILAUDID) injection, ondansetron, oxyCODONE, phenol, polyethylene glycol, traZODone   Vital Signs    Vitals:   08/05/21 2223 08/06/21 0511 08/06/21 0654 08/06/21 0847  BP: 132/70 138/76 126/68 138/77  Pulse:    65  Resp: 20 15    Temp:  97.7 F (36.5 C)    TempSrc:  Oral    SpO2:  94%    Weight:      Height:        Intake/Output Summary (Last 24 hours) at 08/06/2021 1042 Last  data filed at 08/06/2021 1024 Gross per 24 hour  Intake 1577 ml  Output 4800 ml  Net -3223 ml   Last 3 Weights 08/01/2021 07/29/2021 07/27/2021  Weight (lbs) 235 lb 14.3 oz 238 lb 8.6 oz 238 lb 8.6 oz  Weight (kg) 107 kg 108.2 kg 108.2 kg      Telemetry    NSR - Personally Reviewed  ECG    N/A  Physical Exam   GEN: No acute distress.   Neck: No JVD Cardiac: RRR, no murmurs, rubs, or gallops.  Respiratory: Clear to auscultation bilaterally. GI: Soft, nontender, non-distended  MS: S/p bilateral amputations Neuro:  Nonfocal  Psych: Normal affect   Labs    High Sensitivity Troponin:   Recent Labs  Lab 07/22/21 0246 07/22/21 0645  TROPONINIHS 52* 44*     Chemistry Recent Labs  Lab 08/01/21 1634 08/02/21 0232 08/03/21 0204 08/04/21 0221 08/05/21 0214 08/06/21 0158  NA  --  127* 129* 131* 129* 128*  K  --  3.9 3.8 4.0 4.4 4.8  CL  --  84* 89* 91* 90* 92*  CO2  --  35* 34* 34* 30 28  GLUCOSE  --  243* 262*  267* 208* 378*  BUN  --  38* 43* 37* 35* 43*  CREATININE  --  1.77* 1.81* 1.72* 1.53* 1.72*  CALCIUM  --  8.3* 8.2* 8.4* 8.3* 8.1*  MG 1.9 1.7 1.8  --   --   --   PROT  --  5.4*  --   --   --   --   ALBUMIN  --  <1.5*  --   --   --   --   AST  --  33  --   --   --   --   ALT  --  33  --   --   --   --   ALKPHOS  --  77  --   --   --   --   BILITOT  --  0.7  --   --   --   --   GFRNONAA  --  45* 44* 46* 53* 46*  ANIONGAP  --  8 6 6 9 8     Lipids No results for input(s): CHOL, TRIG, HDL, LABVLDL, LDLCALC, CHOLHDL in the last 168 hours.  Hematology Recent Labs  Lab 08/01/21 0259 08/04/21 0221 08/06/21 0158  WBC 13.9* 11.9* 10.8*  RBC 4.82 4.37 4.29  HGB 13.5 12.4* 12.4*  HCT 41.5 38.8* 38.2*  MCV 86.1 88.8 89.0  MCH 28.0 28.4 28.9  MCHC 32.5 32.0 32.5  RDW 15.2 14.9 14.6  PLT 349 386 413*     Radiology    No results found.  Cardiac Studies   TTE 07/23/2021   1. Left ventricular ejection fraction, by estimation, is 35%. The left   ventricle has moderately decreased function. The left ventricle  demonstrates global hypokinesis. There is mild left ventricular  hypertrophy. Left ventricular diastolic parameters are   indeterminate.   2. Right ventricular systolic function is moderately reduced. The right  ventricular size is normal. There is moderately elevated pulmonary artery  systolic pressure. The estimated right ventricular systolic pressure is  75.1 mmHg.   3. Left atrial size was mildly dilated.   4. Right atrial size was mildly dilated.   5. The mitral valve is normal in structure. Trivial mitral valve  regurgitation. No evidence of mitral stenosis.   6. The aortic valve is tricuspid. Aortic valve regurgitation is not  visualized. No aortic stenosis is present.   7. Aortic dilatation noted. There is mild dilatation of the aortic root,  measuring 39 mm.   8. The inferior vena cava is dilated in size with <50% respiratory  variability, suggesting right atrial pressure of 15 mmHg.   9. No vegetation visualized, but if high suspicion would need TEE.  10. The patient was in atrial fibrillation.   Patient Profile     55 y.o. male with heart failure with preserved ejection fraction, uncontrolled diabetes, CKD stage III who was admitted on 07/22/2021 with sepsis secondary to left lower extremity osteomyelitis.  Cardiology was consulted due to new onset atrial flutter as well as new onset systolic heart failure.  Initially admitted to the ICU with sepsis due to diabetic foot infection of the left lower extremity.  He is status post left transtibial amputation.  Blood cultures were positive for Streptococcus organism.  Assessment & Plan    New onset systolic CHF AKI on CKD - LVEF 35% on echo. Felt this is due to arrhythmias vs sepsis. Underwent TEE/DCCV. - Not a candidate for ACE or/ARB/Arni/MRA given significant kidney dysfunction - Continue Toprol XL 50mg   daily - Continue hydralazine 25mg  TID and Imdur 30mg  qd  for afterload reduction - Continue Torsemide 20mg  qd; this has improved his volume status and breathing; kidney  - creatinine may be at baseline ~ 1.7 (1.6-1.8 in January 2021 and September 2022)   New onset Atrial flutter - S/p TEE/DCCV on 07/29/2021 - Maintaining sinus rhythm - Continue Eliquis 5mg  BID (need to be uninterrupted for least 1 month after cardioversion) - completed load and on maintenance amiodarone   Bacteremia - repeat MRI back for 08/07/21  Patient has done well on the regimen above and is in SR and well compensated; will sign off at this time, happy to re-consult as condition warrants  [facility] will sign off.   Medication Recommendations:  BB, Hydralazine/Imdur, Torsemide (outpatient diuretic was lasix 40 mg PO daily) Other recommendations (labs, testing, etc):  NA Follow up as an outpatient:  We will work to arrange outpatient follow up  For questions or updates, please contact [place] Please consult [email or web address] for contact info under        Signed, Werner Lean, MD  08/06/2021, 10:42 AM

## 2021-08-06 NOTE — Progress Notes (Signed)
PROGRESS NOTE  [name]  ZJQ:[id number] DOB: [birth year] DOA: 07/21/2021 PCP: Patient, No Pcp Per (Inactive)   Brief Narrative: 55 year old male with history of diabetes mellitus type 2, diabetic foot infection with prior right BKA, chronic diastolic CHF, hypertension, dyslipidemia presented with worsening left foot swelling/discoloration and discharge.  On presentation, he was hypotensive, tachycardic with A. fib with RVR with CT suggestive of first MTP osteomyelitis and soft tissue gas concerning for possible necrotizing fasciitis along with AKI with creatinine of 2.7 and mild DKA.  His blood pressures did not improve with IV fluids and he was transferred to ICU and PCCM was consulted.  Orthopedics was consulted.  He underwent left transtibial amputation on 07/23/2021.  He was found to have strep agalactiae bacteremia.  ID was consulted.  He was found to have vertebral discitis versus osteomyelitis on MRI.  He was also found to have new systolic dysfunction with LVEF of 35% along with new onset atrial flutter requiring IV amiodarone which has been switched to oral.  Cardiology was consulted.  He has been started on Eliquis.  S/p TEE followed by DCCV on 11/22.  Developed worsening back pain 11/26-11/27, ID reconsulted, requested MRI T/L-spine with contrast. This requires general anesthesia and was performed 11/29. Unfortunately diagnostic images could not be obtained.    Assessment & Plan: Principal Problem:   Discitis of thoracic region Active Problems:   Essential hypertension   Stage 3a chronic kidney disease (HCC)   Type 2 diabetes mellitus with diabetic polyneuropathy, with long-term current use of insulin (HCC)   Right below-knee amputee (HCC)   Diabetic infection of left foot (HCC)   Chronic diastolic (congestive) heart failure (HCC)   Severe sepsis with acute organ dysfunction (HCC)   Chest pain   AKI (acute kidney injury) ([place])   Gas gangrene of foot (HCC)   Atrial flutter  (HCC)   Septic shock (HCC)   Systolic dysfunction   Pressure injury of skin   MSSA bacteremia   Septic shock: Evolving on admission from bacteremia/diabetic foot infection -Off pressors.  Shock resolved. -Required care in ICU under PCCM service: Care has been transferred to [name] on 07/27/2021   Diabetic foot infection, left: Status post left transtibial amputation on 07/23/2021. - Wound/wound VAC care as per Dr. Sharol Given.    Group B streptococcus bacteremia, Possible thoracic discitis/osteomyelitis. No vegetation on TTE or TEE during cardioversion.  MRI of cervical/thoracic/lumbar spine showed possible thoracic osteomyelitis/discitis without evidence of abscess but MRI was without contrast.   - Neurosurgery evaluation appreciated: Not convinced that patient has epidural abscess.  Recommended to manage conservatively with IV antibiotics. -Antibiotics have been changed to penicillin.  Patient will probably need 8 weeks of antibiotics per ID (2 weeks of parenteral therapy with penicillin from 07/23/2021-08/05/2021; then transition to amoxicillin 1 g 3 times daily till 09/14/2021). -Repeat blood cultures from 07/24/2021 were negative -Patient developed worsening of back pain 11/27. ID reconsulted and recommended MRI for recharacterization to inform decision making. Required anesthesia on 11/29 though apparently requires a 1.5T magnet to obtain adequate images which was not available at the time, so MRI was discontinued.  MRI will be re-attempted on 12/1   AKI on stage IIIa CKD: AKI resolving with limitation on diuresis. Had coronary CTA done earlier this year, no implanted devices. Discussed risk of NSF to which the patient confirms understanding and gives consent to proceed with contrasted MRI.  -Cr stable  Pt takes testosterone (not prescription):  - total is low -free pending -can  follow up outpatient   Hyponatremia: Improved.   New onset systolic CHF: Echo showed EF of 35% (previous echo  showed EF of 50 to 55% in 04/2021). Cardiomyopathy may be from new onset atrial flutter RVR related tachycardia versus stress induced cardiomyopathy from bacteremia and septic shock. - per cards (signed off 11/30): BB, Hydralazine/Imdur, Torsemide (outpatient diuretic was lasix 40 mg PO daily)  New onset atrial flutter: Maintained NSR since DCCV 11/22 - Continue metoprolol succinate 50 Mg daily. - Continue amiodarone 200mg  twice daily x7 days and then 200 Mg daily. - Continue eliquis 5 Mg twice daily. MUST remain on this for 1 month post DCCV.   Hypomagnesemia: Improved with supplementation.  - Follow periodically.   Leukocytosis: Continues improving.    T2DM uncontrolled with hyperglycemia:  - Increased Levemir, continue NovoLog 3 times daily with meals and SSI.  Further titrate insulins as needed. -Counseled patient regarding importance of dietary restrictions.  Thrombocytopenia - Resolved   Generalized deconditioning - PT recommends CIR which will be pursued once work up has been completed.    Acute on chronic pain: Follows with Dr. Nelva Bush, pain management.  Has received steroid injections to the back.  - Reviewed [place] PDMP and patient regularly fills opioid prescriptions. Likely opioid-dependent. - Obviously has reasons for acute pain from recent surgery, discitis/osteomyelitis. - Initiated OxyContin 10 Mg every 12 hours on 11/23.  This was increased to 15 mg every 12 hours on 11/27 - Evaluating etiology for worsening back pain as noted above.   Patient on following regimen after making changes: - Tylenol 1 g 3 times daily. - Oxy IR 10 mg every 4 hourly as needed for moderate pain - OxyContin 15 Mg every 12 hours - Lidocaine patch increased to 2 patches - Dilaudid 0.5 mg IV every 4 hourly as needed severe pain - Gabapentin 300 mg 3 times daily -added robaxin for muscle relaxer   Insomnia - Started trazodone 50 Mg at bedtime as needed on 11/24 with good effect, continue.  RN  Pressure Injury Documentation: Pressure Injury 07/27/21 Sacrum Mid Stage 2 -  Partial thickness loss of dermis presenting as a shallow open injury with a red, pink wound bed without slough. small pink pressure ulcer with break in skin (Active)  07/27/21 0255  Location: Sacrum  Location Orientation: Mid  Staging: Stage 2 -  Partial thickness loss of dermis presenting as a shallow open injury with a red, pink wound bed without slough.  Wound Description (Comments): small pink pressure ulcer with break in skin  Present on Admission: Yes   DVT prophylaxis: Eliquis Code Status: Full Disposition Plan:  Status is: Inpatient  Remains inpatient appropriate because: Requires ongoing work up with MRI, ID recommendations, then would pursue rehabilitation. CM has arranged PCP.  Consultants:  ID Neurosurgery PCCM Cardiology  Procedures:  Left TTA 07/23/2021 TEE/DCCV 07/29/2021    Subjective: Willing to try a muscle relaxer to help with pain  Objective: Vitals:   08/05/21 2223 08/06/21 0511 08/06/21 0654 08/06/21 0847  BP: 132/70 138/76 126/68 138/77  Pulse:    65  Resp: 20 15    Temp:  97.7 F (36.5 C)    TempSrc:  Oral    SpO2:  94%    Weight:      Height:        Intake/Output Summary (Last 24 hours) at 08/06/2021 1236 Last data filed at 08/06/2021 1024 Gross per 24 hour  Intake 1577 ml  Output 4000 ml  Net -2423 ml  Filed Weights   07/27/21 0252 07/29/21 0754 08/01/21 0459  Weight: 108.2 kg 108.2 kg 107 kg    General: Appearance:     Overweight male in no acute distress     Lungs:     respirations unlabored  Heart:    Normal heart rate.        Neurologic:   Awake, alert, oriented x 3. No apparent focal neurological           defect.     Data Reviewed: I have personally reviewed following labs and imaging studies  CBC: Recent Labs  Lab 07/31/21 0130 08/01/21 0259 08/04/21 0221 08/06/21 0158  WBC 14.8* 13.9* 11.9* 10.8*  NEUTROABS  --   --   --  9.9*   HGB 14.0 13.5 12.4* 12.4*  HCT 41.4 41.5 38.8* 38.2*  MCV 84.8 86.1 88.8 89.0  PLT 318 349 386 767*   Basic Metabolic Panel: Recent Labs  Lab 07/31/21 0130 08/01/21 0259 08/01/21 1634 08/02/21 0232 08/03/21 0204 08/04/21 0221 08/05/21 0214 08/06/21 0158  NA 128* 129*  --  127* 129* 131* 129* 128*  K 4.3 4.2  --  3.9 3.8 4.0 4.4 4.8  CL 91* 89*  --  84* 89* 91* 90* 92*  CO2 29 33*  --  35* 34* 34* 30 28  GLUCOSE 282* 286*  --  243* 262* 267* 208* 378*  BUN 28* 29*  --  38* 43* 37* 35* 43*  CREATININE 1.40* 1.52*  --  1.77* 1.81* 1.72* 1.53* 1.72*  CALCIUM 8.0* 8.1*  --  8.3* 8.2* 8.4* 8.3* 8.1*  MG 1.6* 1.5* 1.9 1.7 1.8  --   --   --    GFR: Estimated Creatinine Clearance: 64.2 mL/min (A) (by C-G formula based on SCr of 1.72 mg/dL (H)). Liver Function Tests: Recent Labs  Lab 08/02/21 0232  AST 33  ALT 33  ALKPHOS 77  BILITOT 0.7  PROT 5.4*  ALBUMIN <1.5*   No results for input(s): LIPASE, AMYLASE in the last 168 hours. No results for input(s): AMMONIA in the last 168 hours. Coagulation Profile: No results for input(s): INR, PROTIME in the last 168 hours. Cardiac Enzymes: No results for input(s): CKTOTAL, CKMB, CKMBINDEX, TROPONINI in the last 168 hours. BNP (last 3 results) No results for input(s): PROBNP in the last 8760 hours. HbA1C: No results for input(s): HGBA1C in the last 72 hours. CBG: Recent Labs  Lab 08/05/21 1347 08/05/21 1544 08/05/21 2138 08/06/21 0754 08/06/21 1140  GLUCAP 103* 117* 389* 271* 331*   Lipid Profile: No results for input(s): CHOL, HDL, LDLCALC, TRIG, CHOLHDL, LDLDIRECT in the last 72 hours. Thyroid Function Tests: No results for input(s): TSH, T4TOTAL, FREET4, T3FREE, THYROIDAB in the last 72 hours. Anemia Panel: No results for input(s): VITAMINB12, FOLATE, FERRITIN, TIBC, IRON, RETICCTPCT in the last 72 hours. Urine analysis:    Component Value Date/Time   COLORURINE YELLOW 07/22/2021 0250   APPEARANCEUR CLOUDY (A)  07/22/2021 0250   LABSPEC 1.018 07/22/2021 0250   PHURINE 5.0 07/22/2021 0250   GLUCOSEU >=500 (A) 07/22/2021 0250   HGBUR SMALL (A) 07/22/2021 0250   BILIRUBINUR NEGATIVE 07/22/2021 0250   KETONESUR 5 (A) 07/22/2021 0250   PROTEINUR 30 (A) 07/22/2021 0250   UROBILINOGEN 0.2 09/13/2019 0906   NITRITE NEGATIVE 07/22/2021 0250   LEUKOCYTESUR TRACE (A) 07/22/2021 0250   No results found for this or any previous visit (from the past 240 hour(s)).    Radiology Studies: No results found.  Scheduled Meds:  acetaminophen  1,000 mg Oral TID   amiodarone  200 mg Oral Daily   apixaban  5 mg Oral BID   vitamin C  1,000 mg Oral Daily   docusate sodium  100 mg Oral Daily   feeding supplement (GLUCERNA SHAKE)  237 mL Oral TID BM   gabapentin  300 mg Oral TID   hydrALAZINE  25 mg Oral Q8H   insulin aspart  0-15 Units Subcutaneous TID WC   insulin aspart  0-5 Units Subcutaneous QHS   insulin aspart  5 Units Subcutaneous TID WC   insulin detemir  25 Units Subcutaneous BID   isosorbide mononitrate  30 mg Oral Daily   lidocaine  2 patch Transdermal Q24H   [START ON 08/07/2021] LORazepam  1 mg Intravenous Once   methocarbamol  500 mg Oral TID   metoprolol succinate  50 mg Oral Daily   multivitamin with minerals  1 tablet Oral Daily   nutrition supplement (JUVEN)  1 packet Oral BID BM   oxyCODONE  15 mg Oral Q12H   polyethylene glycol  17 g Oral Daily   Ensure Max Protein  11 oz Oral Daily   torsemide  20 mg Oral Daily   Continuous Infusions:  sodium chloride 10 mL/hr at 07/29/21 0816   lactated ringers Stopped (07/23/21 1013)   penicillin g continuous IV infusion 12 Million Units (08/06/21 1215)     LOS: 15 days   Time spent: 25 minutes.  Geradine Girt, DO [facility] [email or web address] 08/06/2021, 12:36 PM

## 2021-08-06 NOTE — Progress Notes (Signed)
Physical Therapy Treatment Patient Details Name: [name] MRN: [id number] DOB: [birth year] Today's Date: 08/06/2021   History of Present Illness 55 yo admitted 11/14 with infected left foot s/p Left BKA 11/16. Pt with T6-8 discitis. PMhx: Rt BKA, Aflutter, DM, depression and anxiety    PT Comments    Treated pt in conjunction with OT to maximize pt safety with mobility and due to pt's significant pain. Even though pt has significant pain with mobility, he was motivated to participate and improve. He is requiring minA to roll to the R and maxA to roll to the L using the bed rails. Pt with L lower extremity weakness impacting his ability to lift it for bed mobility. Performed multiple rolling reps and several leg exercises to improve core and leg strength. Will continue to follow acutely. Pt would greatly benefit from intensive therapy in the inpatient rehab setting as he has had a significant decline in function and is young.    Recommendations for follow up therapy are one component of a multi-disciplinary discharge planning process, led by the attending physician.  Recommendations may be updated based on patient status, additional functional criteria and insurance authorization.  Follow Up Recommendations  Acute inpatient rehab (3hours/day)     Assistance Recommended at Discharge Frequent or constant Supervision/Assistance  Equipment Recommendations  Wheelchair (measurements PT);Wheelchair cushion (measurements PT)    Recommendations for Other Services Rehab consult     Precautions / Restrictions Precautions Precautions: Fall;Back (back precautions for comfort) Precaution Comments: wound vac L LE; educated pt on BLT for comfort Required Braces or Orthoses: Other Brace Other Brace: LLE limb protector, R BK prosthesis Restrictions Weight Bearing Restrictions: Yes LLE Weight Bearing: Non weight bearing Other Position/Activity Restrictions: RLE prosthetic is not fitting currently  per pt     Mobility  Bed Mobility Overal bed mobility: Needs Assistance Bed Mobility: Rolling Rolling: Min assist;Max assist;+2 for safety/equipment         General bed mobility comments: Pt rolling to R with less pain than to L, thus rolled to R majority of session for pericare and changing of pads. MinA to assist with bringing L leg across body to roll to R using bed rail. MaxA to roll to L. Cues provided for log roll technique to manage back pain. +2 for safety and to hold pt while other performs pericare etc. TAx2 to pull pt superiorly in bed using bed pads.    Transfers                   General transfer comment: declined    Ambulation/Gait               General Gait Details: declined   Stairs             Wheelchair Mobility    Modified Rankin (Stroke Patients Only)       Balance                                            Cognition Arousal/Alertness: Awake/alert Behavior During Therapy: WFL for tasks assessed/performed;Anxious Overall Cognitive Status: Within Functional Limits for tasks assessed                                 General Comments: Anxious with movement due to pain.  Exercises General Exercises - Lower Extremity Quad Sets: Strengthening;Both;10 reps;Supine Hip ABduction/ADduction: AAROM;Left;10 reps;Sidelying (hip abduction)    General Comments General comments (skin integrity, edema, etc.): Positionined pt slightly rolled to R to reduce pressure on sacral wound, communicating with wound care therapist to see if pt may benefit from it      Pertinent Vitals/Pain Pain Assessment: Faces Faces Pain Scale: Hurts worst Pain Location: back Pain Descriptors / Indicators: Grimacing;Guarding;Moaning;Pressure Pain Intervention(s): Limited activity within patient's tolerance;Monitored during session;Repositioned;Patient requesting pain meds-RN notified    Home Living                           Prior Function            PT Goals (current goals can now be found in the care plan section) Acute Rehab PT Goals Patient Stated Goal: to get back to his normal PT Goal Formulation: With patient Time For Goal Achievement: 08/20/21 Potential to Achieve Goals: Fair Progress towards PT goals: Not progressing toward goals - comment (limited by pain)    Frequency    Min 3X/week      PT Plan Current plan remains appropriate    Co-evaluation PT/OT/SLP Co-Evaluation/Treatment: Yes Reason for Co-Treatment: To address functional/ADL transfers;Other (comment) (high pain level) PT goals addressed during session: Mobility/safety with mobility;Strengthening/ROM        AM-PAC PT "6 Clicks" Mobility   Outcome Measure  Help needed turning from your back to your side while in a flat bed without using bedrails?: A Lot Help needed moving from lying on your back to sitting on the side of a flat bed without using bedrails?: A Lot Help needed moving to and from a bed to a chair (including a wheelchair)?: Total Help needed standing up from a chair using your arms (e.g., wheelchair or bedside chair)?: Total Help needed to walk in hospital room?: Total Help needed climbing 3-5 steps with a railing? : Total 6 Click Score: 8    End of Session   Activity Tolerance: Patient limited by pain Patient left: in bed;with call bell/phone within reach;with bed alarm set Nurse Communication: Mobility status;Patient requests pain meds;Other (comment) (sacral wound) PT Visit Diagnosis: Other abnormalities of gait and mobility (R26.89);Muscle weakness (generalized) (M62.81)     Time: 2703-5009 PT Time Calculation (min) (ACUTE ONLY): 30 min  Charges:  $Therapeutic Exercise: 8-22 mins                     Moishe Spice, PT, DPT Acute Rehabilitation Services  Pager: [phone] Office: [place] 08/06/2021, 11:33 AM

## 2021-08-06 NOTE — Progress Notes (Signed)
Occupational Therapy Treatment Patient Details Name: [name] MRN: [id number] DOB: [birth year] Today's Date: 08/06/2021   History of present illness 55 yo admitted 11/14 with infected left foot s/p Left BKA 11/16. Pt with T6-8 discitis. PMhx: Rt BKA, Aflutter, DM, depression and anxiety   OT comments  Patient supine in bed and agreeable to OT/PT session. Pt reports slightly improved back pain today, agreeable to bed level tasks only.  Pt soiled in bed (incontinent bowel and primo fit leaking), requires +2 to complete hygiene and change bed pads with min assist to roll towards R and max assist to roll towards L.  Increased time and effort, cueing for PLB throughout as pt tends to hold his breath in pain.   Continue per POC. Will follow acutely.    Recommendations for follow up therapy are one component of a multi-disciplinary discharge planning process, led by the attending physician.  Recommendations may be updated based on patient status, additional functional criteria and insurance authorization.    Follow Up Recommendations  Acute inpatient rehab (3hours/day)    Assistance Recommended at Discharge Frequent or constant Supervision/Assistance  Equipment Recommendations  None recommended by OT    Recommendations for Other Services Rehab consult    Precautions / Restrictions Precautions Precautions: Fall;Back (back precautions for comfort) Precaution Comments: wound vac L LE; educated pt on BLT for comfort Required Braces or Orthoses: Other Brace Other Brace: LLE limb protector, R BK prosthesis Restrictions Weight Bearing Restrictions: Yes LLE Weight Bearing: Non weight bearing Other Position/Activity Restrictions: RLE prosthetic is not fitting currently per pt       Mobility Bed Mobility Overal bed mobility: Needs Assistance Bed Mobility: Rolling Rolling: Min assist;Max assist;+2 for safety/equipment         General bed mobility comments: Pt rolling to R with less  pain than to L, thus rolled to R majority of session for pericare and changing of pads. MinA to assist with bringing L leg across body to roll to R using bed rail. MaxA to roll to L. Cues provided for log roll technique to manage back pain. +2 for safety and to hold pt while other performs pericare etc. TAx2 to pull pt superiorly in bed using bed pads.    Transfers                   General transfer comment: declined     Balance                                           ADL either performed or assessed with clinical judgement   ADL Overall ADL's : Needs assistance/impaired     Grooming: Set up;Bed level   Upper Body Bathing: Set up;Bed level       Upper Body Dressing : Minimal assistance;Bed level Upper Body Dressing Details (indicate cue type and reason): donning new gown         Toileting- Clothing Manipulation and Hygiene: Total assistance;+2 for physical assistance;+2 for safety/equipment;Bed level Toileting - Clothing Manipulation Details (indicate cue type and reason): incontient BM, +2 total assist for hygiene     Functional mobility during ADLs: Minimal assistance;Maximal assistance (bed mobility only)      Extremity/Trunk Assessment              Vision       Perception     Praxis  Cognition Arousal/Alertness: Awake/alert Behavior During Therapy: WFL for tasks assessed/performed;Anxious Overall Cognitive Status: Within Functional Limits for tasks assessed                                 General Comments: Anxious with movement due to pain.          Exercises Exercises: General Lower Extremity General Exercises - Lower Extremity Quad Sets: Strengthening;Both;10 reps;Supine Hip ABduction/ADduction: AAROM;Left;10 reps;Sidelying (hip abduction)   Shoulder Instructions       General Comments rolled to R to reduce pressure on sacral wound    Pertinent Vitals/ Pain       Pain Assessment: Faces Faces  Pain Scale: Hurts worst Pain Location: back Pain Descriptors / Indicators: Grimacing;Guarding;Moaning;Pressure Pain Intervention(s): Limited activity within patient's tolerance;Monitored during session;Repositioned;Patient requesting pain meds-RN notified  Home Living                                          Prior Functioning/Environment              Frequency  Min 2X/week        Progress Toward Goals  OT Goals(current goals can now be found in the care plan section)  Progress towards OT goals: Progressing toward goals  Acute Rehab OT Goals Patient Stated Goal: less pain OT Goal Formulation: With patient  Plan Discharge plan remains appropriate;Frequency remains appropriate    Co-evaluation    PT/OT/SLP Co-Evaluation/Treatment: Yes Reason for Co-Treatment: To address functional/ADL transfers (pain) PT goals addressed during session: Mobility/safety with mobility;Strengthening/ROM OT goals addressed during session: ADL's and self-care      AM-PAC OT "6 Clicks" Daily Activity     Outcome Measure   Help from another person eating meals?: A Little Help from another person taking care of personal grooming?: A Little Help from another person toileting, which includes using toliet, bedpan, or urinal?: Total Help from another person bathing (including washing, rinsing, drying)?: A Lot Help from another person to put on and taking off regular upper body clothing?: A Little Help from another person to put on and taking off regular lower body clothing?: Total 6 Click Score: 13    End of Session    OT Visit Diagnosis: Muscle weakness (generalized) (M62.81);Pain;Other abnormalities of gait and mobility (R26.89) Pain - part of body:  (back)   Activity Tolerance Patient tolerated treatment well;Patient limited by pain   Patient Left in bed;with call bell/phone within reach   Nurse Communication Mobility status;Precautions (pain meds, primo fit  leaking)        Time: 1610-9604 OT Time Calculation (min): 42 min  Charges: OT General Charges $OT Visit: 1 Visit OT Treatments $Self Care/Home Management : 23-37 mins  [place] Pager [phone] Office [phone]   Delight Stare 08/06/2021, 1:33 PM

## 2021-08-07 ENCOUNTER — Encounter (HOSPITAL_COMMUNITY)
Admission: EM | Disposition: A | Discharge status: Skilled Nursing Facility | Payer: Self-pay | Source: Home / Self Care | Attending: Internal Medicine

## 2021-08-07 ENCOUNTER — Inpatient Hospital Stay (HOSPITAL_COMMUNITY): Payer: Medicare HMO

## 2021-08-07 ENCOUNTER — Inpatient Hospital Stay (HOSPITAL_COMMUNITY): Payer: Medicare HMO | Admitting: Certified Registered"

## 2021-08-07 ENCOUNTER — Encounter (HOSPITAL_COMMUNITY): Payer: Self-pay | Admitting: Internal Medicine

## 2021-08-07 DIAGNOSIS — M545 Low back pain, unspecified: Secondary | ICD-10-CM | POA: Diagnosis not present

## 2021-08-07 DIAGNOSIS — M5137 Other intervertebral disc degeneration, lumbosacral region: Secondary | ICD-10-CM | POA: Diagnosis not present

## 2021-08-07 DIAGNOSIS — M4644 Discitis, unspecified, thoracic region: Secondary | ICD-10-CM | POA: Diagnosis not present

## 2021-08-07 DIAGNOSIS — M4624 Osteomyelitis of vertebra, thoracic region: Secondary | ICD-10-CM | POA: Diagnosis not present

## 2021-08-07 DIAGNOSIS — I4892 Unspecified atrial flutter: Secondary | ICD-10-CM | POA: Diagnosis not present

## 2021-08-07 DIAGNOSIS — M4804 Spinal stenosis, thoracic region: Secondary | ICD-10-CM | POA: Diagnosis not present

## 2021-08-07 DIAGNOSIS — M2578 Osteophyte, vertebrae: Secondary | ICD-10-CM | POA: Diagnosis not present

## 2021-08-07 DIAGNOSIS — M546 Pain in thoracic spine: Secondary | ICD-10-CM | POA: Diagnosis not present

## 2021-08-07 HISTORY — PX: RADIOLOGY WITH ANESTHESIA: SHX6223

## 2021-08-07 LAB — GLUCOSE, CAPILLARY
Glucose-Capillary: 130 mg/dL — ABNORMAL HIGH (ref 70–99)
Glucose-Capillary: 125 mg/dL — ABNORMAL HIGH (ref 70–99)
Glucose-Capillary: 131 mg/dL — ABNORMAL HIGH (ref 70–99)
Glucose-Capillary: 147 mg/dL — ABNORMAL HIGH (ref 70–99)
Glucose-Capillary: 442 mg/dL — ABNORMAL HIGH (ref 70–99)

## 2021-08-07 LAB — BASIC METABOLIC PANEL
Anion gap: 7 (ref 5–15)
BUN: 35 mg/dL — ABNORMAL HIGH (ref 6–20)
CO2: 31 mmol/L (ref 22–32)
Calcium: 8.4 mg/dL — ABNORMAL LOW (ref 8.9–10.3)
Chloride: 95 mmol/L — ABNORMAL LOW (ref 98–111)
Creatinine, Ser: 1.52 mg/dL — ABNORMAL HIGH (ref 0.61–1.24)
GFR, Estimated: 54 mL/min — ABNORMAL LOW (ref >60)
Glucose, Bld: 196 mg/dL — ABNORMAL HIGH (ref 70–99)
Potassium: 4 mmol/L (ref 3.5–5.1)
Sodium: 133 mmol/L — ABNORMAL LOW (ref 135–145)

## 2021-08-07 LAB — GLUCOSE, RANDOM: Glucose, Bld: 373 mg/dL — ABNORMAL HIGH (ref 70–99)

## 2021-08-07 IMAGING — MR MR LUMBAR SPINE WO/W CM
5 of 9 series · 24 of 48 positions shown · IV contrast (gadavist)
Comparison: MRI lumbar spine [DATE]

CLINICAL DATA: Low back pain.  Suspect infection

EXAM:
MRI LUMBAR SPINE WITHOUT AND WITH CONTRAST
TECHNIQUE: Multiplanar and multiecho pulse sequences of the lumbar spine were
obtained without and with intravenous contrast.
CONTRAST:  10mL GADAVIST GADOBUTROL 1 MMOL/ML IV SOLN

[Series 1: T2 · sagittal · 4.0mm · 0.73mm/px · 4 of 17 slices shown (1 of 3)]
[im 1/17]
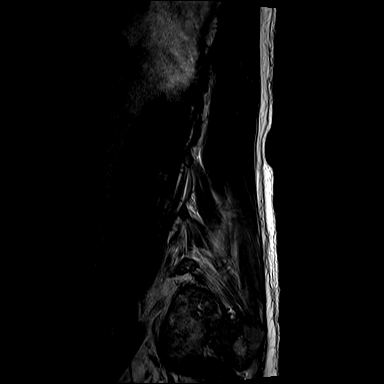
[im 6/17]
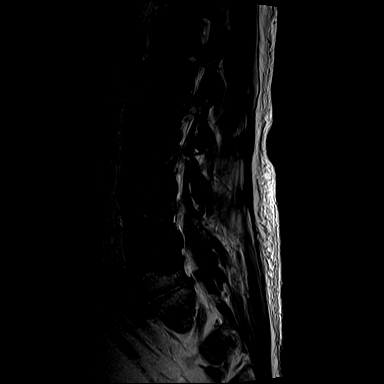
[im 11/17]
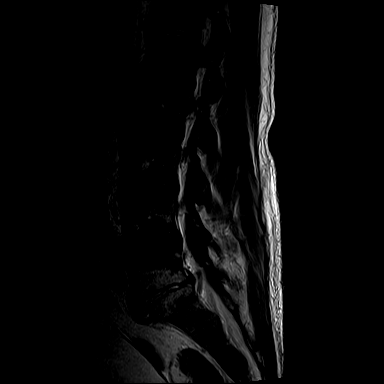
[im 17/17]
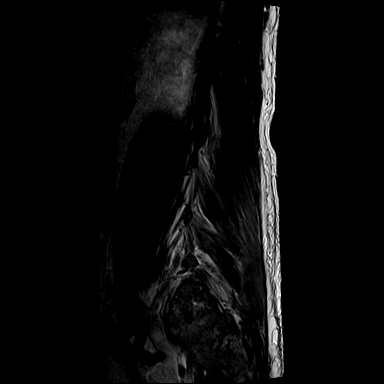

[Series 2: T2 · sagittal · 4.0mm · 0.55mm/px · 4 of 14 slices shown (2 of 3)]
[im 1/14]
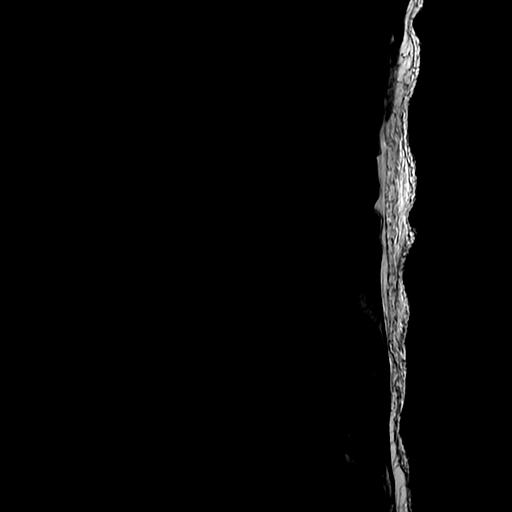
[im 5/14]
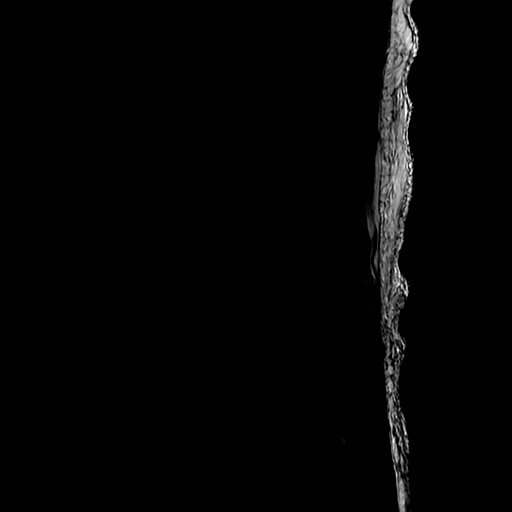
[im 9/14]
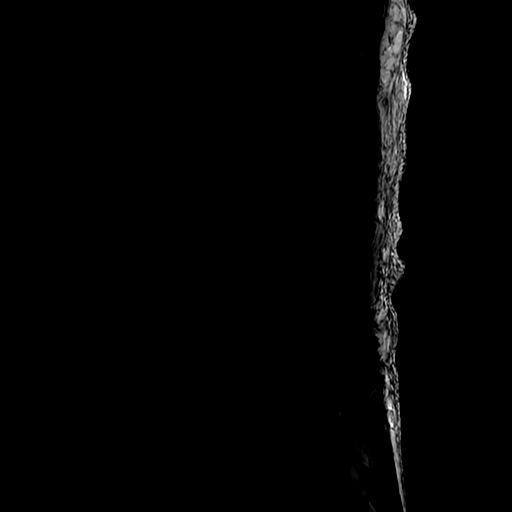
[im 14/14]
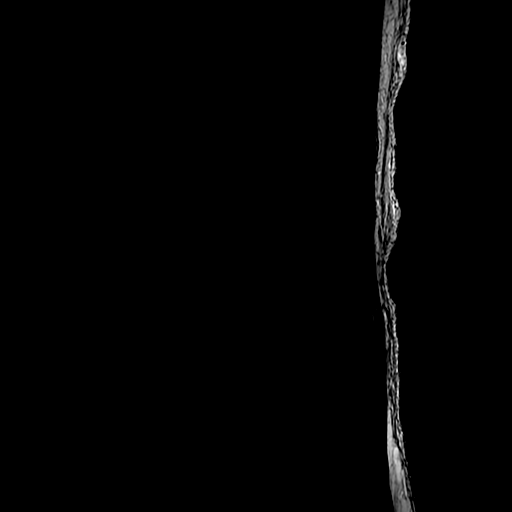

[Series 3: T1 · sagittal · 4.0mm · 0.88mm/px · 4 of 17 slices shown (1 of 2)]
[im 1/17]
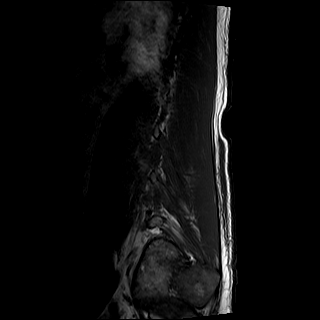
[im 6/17]
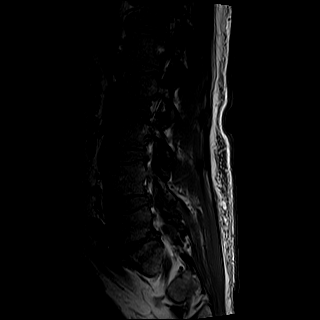
[im 11/17]
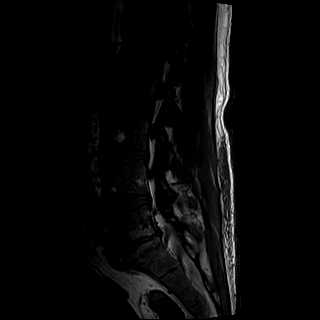
[im 17/17]
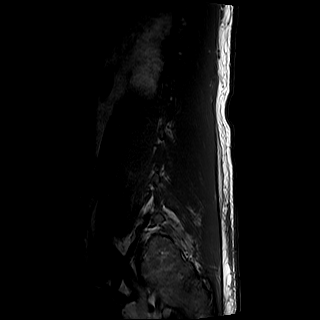

[Series 4: T2 · axial · 5.0mm · 0.57mm/px · z∈[-380,-144]mm · 8 of 30 slices shown (3 of 3)]
[im 1/30]
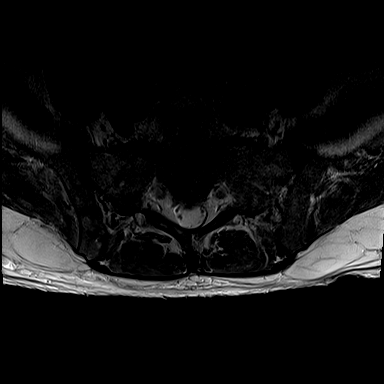
[im 5/30]
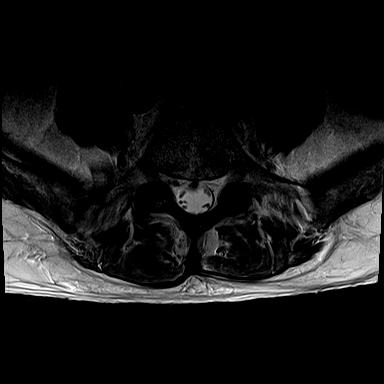
[im 9/30]
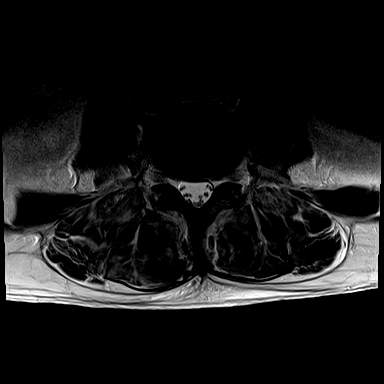
[im 13/30]
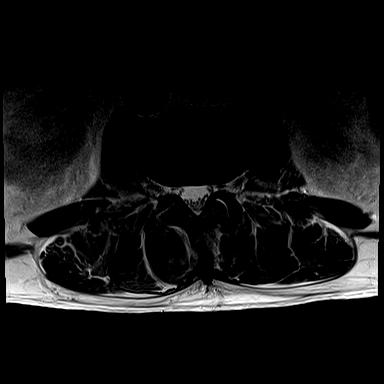
[im 17/30]
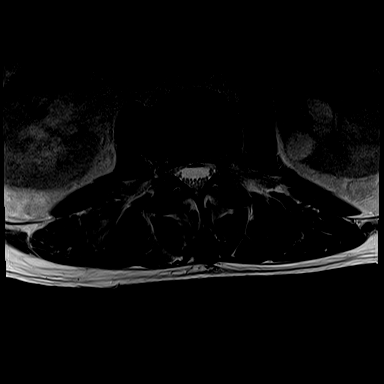
[im 21/30]
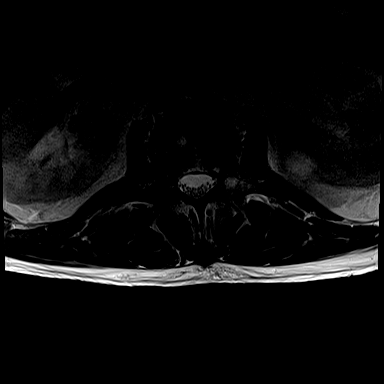
[im 25/30]
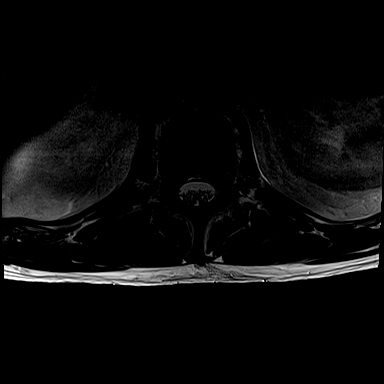
[im 30/30]
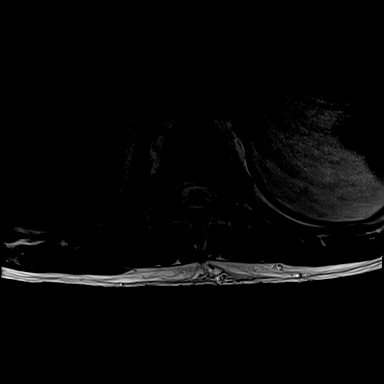

[Series 5: T1 · axial · 5.0mm · 0.34mm/px · z∈[-380,-291]mm · 4 of 30 slices shown (2 of 2)]
[im 1/30]
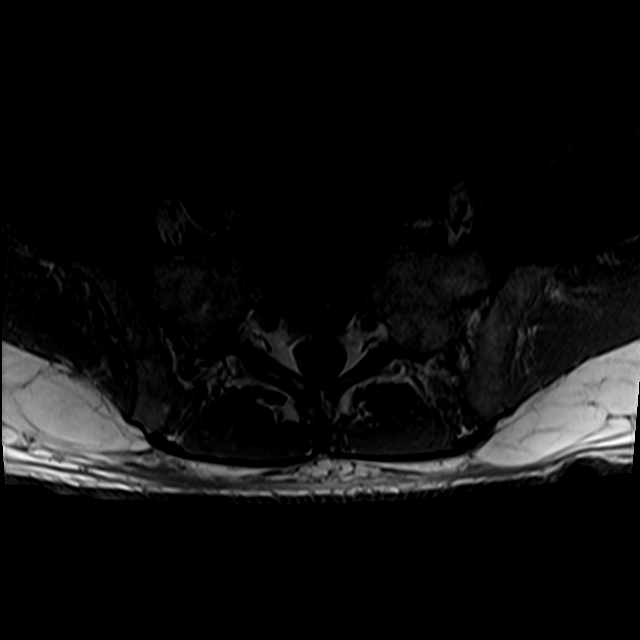
[im 5/30]
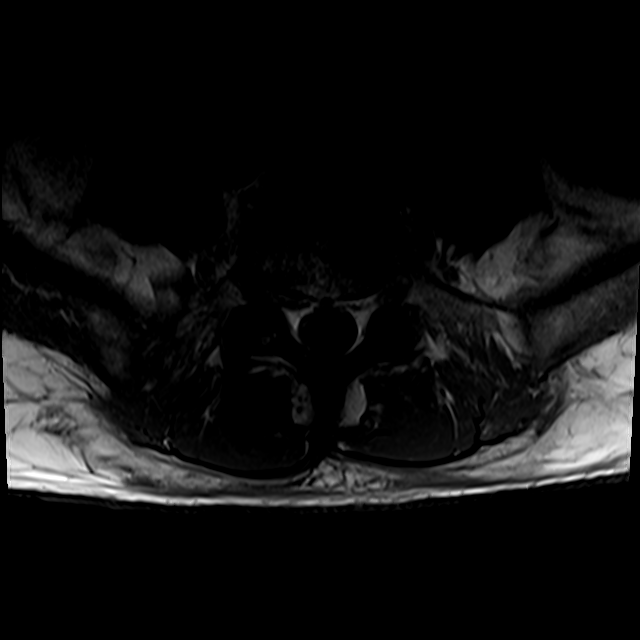
[im 9/30]
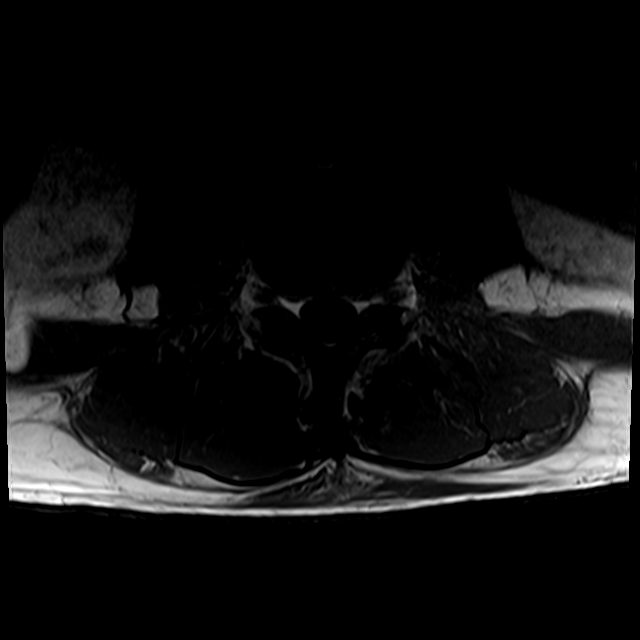
[im 13/30]
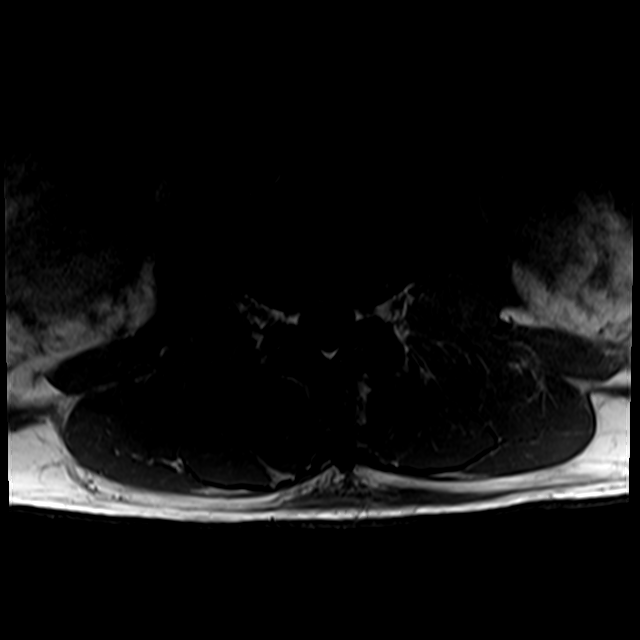

[24 of 48 positions shown; findings below may reference images not displayed]

FINDINGS: Segmentation:  Standard

Alignment:  Normal

Vertebrae: Negative for fracture or mass. Moderate discogenic edema
in the bone marrow at L5-S1 with associated disc space narrowing.
Small amount of fluid in the disc space. Minimal enhancement of the
endplates. Findings most compatible with disc degeneration.

Conus medullaris and cauda equina: Conus extends to the L1 level.
Conus and cauda equina appear normal.

Paraspinal and other soft tissues: Diffuse paraspinous muscle edema
without abscess. Findings compatible with myositis. Small left renal
cysts.

Disc levels:

L1-2: Mild disc bulging.  Negative for stenosis

L2-3: Shallow central disc protrusion.  Negative for stenosis

L3-4: Mild disc bulging.  Negative for stenosis

L4-5: Shallow broad-based disc protrusion and mild facet
degeneration. Negative for stenosis

L5-S1. Advanced disc degeneration with disc space narrowing. Diffuse
endplate spurring. Bone marrow edema most compatible with disc
degeneration. Small central disc protrusion is unchanged. Mild facet
degeneration. Moderate subarticular and foraminal stenosis
bilaterally.
IMPRESSION: 1. No evidence of discitis/osteomyelitis in the lumbar spine. There
is progressive muscle edema in the paraspinous muscles bilaterally
compatible with myositis. No abscess
2. Advanced disc degeneration at L5-S1 with discogenic bone marrow
edema.

## 2021-08-07 SURGERY — MRI WITH ANESTHESIA
Anesthesia: General

## 2021-08-07 MED ORDER — LIDOCAINE 2% (20 MG/ML) 5 ML SYRINGE
INTRAMUSCULAR | Status: DC | PRN
  Administered 2021-08-07: 80 mg via INTRAVENOUS

## 2021-08-07 MED ORDER — FENTANYL CITRATE (PF) 250 MCG/5ML IJ SOLN
INTRAMUSCULAR | Status: DC | PRN
  Administered 2021-08-07: 100 ug via INTRAVENOUS

## 2021-08-07 MED ORDER — GLYCOPYRROLATE 0.2 MG/ML IJ SOLN
INTRAMUSCULAR | Status: DC | PRN
  Administered 2021-08-07: .2 mg via INTRAVENOUS

## 2021-08-07 MED ORDER — ORAL CARE MOUTH RINSE
15.0000 mL | Freq: Once | OROMUCOSAL | Status: AC
  Administered 2021-08-07: 15 mL via OROMUCOSAL

## 2021-08-07 MED ORDER — VANCOMYCIN HCL 2000 MG/400ML IV SOLN
2000.0000 mg | INTRAVENOUS | Status: AC
  Administered 2021-08-07: 2000 mg via INTRAVENOUS
  Filled 2021-08-07: qty 400

## 2021-08-07 MED ORDER — LACTATED RINGERS IV SOLN: INTRAVENOUS | Status: DC

## 2021-08-07 MED ORDER — PROPOFOL 10 MG/ML IV BOLUS
INTRAVENOUS | Status: DC | PRN
  Administered 2021-08-07: 150 mg via INTRAVENOUS
  Administered 2021-08-07: 20 mg via INTRAVENOUS

## 2021-08-07 MED ORDER — DEXAMETHASONE SODIUM PHOSPHATE 10 MG/ML IJ SOLN
INTRAMUSCULAR | Status: DC | PRN
Start: 2021-08-07 — End: 2021-08-07
  Administered 2021-08-07: 10 mg via INTRAVENOUS

## 2021-08-07 MED ORDER — VANCOMYCIN HCL 2000 MG/400ML IV SOLN
2000.0000 mg | INTRAVENOUS | Status: DC
  Filled 2021-08-07: qty 400

## 2021-08-07 MED ORDER — MIDAZOLAM HCL 2 MG/2ML IJ SOLN
INTRAMUSCULAR | Status: DC | PRN
  Administered 2021-08-07: 2 mg via INTRAVENOUS

## 2021-08-07 MED ORDER — GADOBUTROL 1 MMOL/ML IV SOLN
10.0000 mL | Freq: Once | INTRAVENOUS | Status: AC | PRN
  Administered 2021-08-07: 10 mL via INTRAVENOUS

## 2021-08-07 MED ORDER — EPHEDRINE SULFATE-NACL 50-0.9 MG/10ML-% IV SOSY
PREFILLED_SYRINGE | INTRAVENOUS | Status: DC | PRN
  Administered 2021-08-07: 10 mg via INTRAVENOUS
  Administered 2021-08-07: 15 mg via INTRAVENOUS

## 2021-08-07 MED ORDER — SODIUM CHLORIDE 0.9 % IV SOLN
2.0000 g | Freq: Three times a day (TID) | INTRAVENOUS | Status: DC
  Administered 2021-08-07 – 2021-08-11 (×12): 2 g via INTRAVENOUS
  Filled 2021-08-07 (×12): qty 2

## 2021-08-07 MED ORDER — HEPARIN (PORCINE) 25000 UT/250ML-% IV SOLN
2600.0000 [IU]/h | INTRAVENOUS | Status: DC
  Administered 2021-08-07 (×2): 2000 [IU]/h via INTRAVENOUS
  Administered 2021-08-08: 19:00:00 2400 [IU]/h via INTRAVENOUS
  Administered 2021-08-08: 07:00:00 2200 [IU]/h via INTRAVENOUS
  Administered 2021-08-09: 2600 [IU]/h via INTRAVENOUS
  Filled 2021-08-07 (×4): qty 250

## 2021-08-07 MED ORDER — CHLORHEXIDINE GLUCONATE 0.12 % MT SOLN: 15.0000 mL | Freq: Once | OROMUCOSAL | Status: AC

## 2021-08-07 MED ORDER — SUCCINYLCHOLINE CHLORIDE 200 MG/10ML IV SOSY
PREFILLED_SYRINGE | INTRAVENOUS | Status: DC | PRN
  Administered 2021-08-07: 140 mg via INTRAVENOUS

## 2021-08-07 MED ORDER — VANCOMYCIN HCL 750 MG/150ML IV SOLN
750.0000 mg | Freq: Three times a day (TID) | INTRAVENOUS | Status: AC
  Administered 2021-08-08 – 2021-08-09 (×5): 750 mg via INTRAVENOUS
  Filled 2021-08-07 (×5): qty 150

## 2021-08-07 MED ORDER — CHLORHEXIDINE GLUCONATE CLOTH 2 % EX PADS
6.0000 | MEDICATED_PAD | Freq: Every day | CUTANEOUS | Status: DC
  Administered 2021-08-08 – 2021-09-08 (×30): 6 via TOPICAL

## 2021-08-07 MED ORDER — PHENYLEPHRINE HCL-NACL 20-0.9 MG/250ML-% IV SOLN
INTRAVENOUS | Status: DC | PRN
  Administered 2021-08-07: 20 ug/min via INTRAVENOUS

## 2021-08-07 MED ORDER — ONDANSETRON HCL 4 MG/2ML IJ SOLN
INTRAMUSCULAR | Status: DC | PRN
  Administered 2021-08-07: 4 mg via INTRAVENOUS

## 2021-08-07 MED ORDER — ROCURONIUM BROMIDE 10 MG/ML (PF) SYRINGE
PREFILLED_SYRINGE | INTRAVENOUS | Status: DC | PRN
  Administered 2021-08-07: 100 mg via INTRAVENOUS
  Administered 2021-08-07: 30 mg via INTRAVENOUS

## 2021-08-07 MED ORDER — SUGAMMADEX SODIUM 200 MG/2ML IV SOLN
INTRAVENOUS | Status: DC | PRN
  Administered 2021-08-07: 200 mg via INTRAVENOUS

## 2021-08-07 NOTE — Anesthesia Postprocedure Evaluation (Signed)
Anesthesia Post Note  Patient: [name]  Procedure(s) Performed: MRI WITH LUMBER WITH AND WITHOUT CONTRAST,THORACIC WITH AND WITHOUT CONTRAST     Patient location during evaluation: PACU Anesthesia Type: General Level of consciousness: awake Pain management: pain level controlled Vital Signs Assessment: post-procedure vital signs reviewed and stable Respiratory status: spontaneous breathing Cardiovascular status: stable Postop Assessment: no apparent nausea or vomiting Anesthetic complications: no   No notable events documented.  Last Vitals:  Vitals:   08/07/21 1450 08/07/21 1520  BP: (!) 99/58 97/62  Pulse: 74 67  Resp: 12 14  Temp: 36.7 C 36.6 C  SpO2: 93% 96%    Last Pain:  Vitals:   08/07/21 1520  TempSrc: Oral  PainSc: 0-No pain                 [name]

## 2021-08-07 NOTE — Progress Notes (Signed)
Patient arrived to short stay with gown and sheets soiled in urine. Male purewick not functioning correctly.  Scrotum edematous and excoriated. Ulcers scattered on penis, purulent drainage present. Very painful. Stage 3 ulcer on sacrum. Sacral foam dressing changed. Dr. Eliseo Squires notified of all of these things, foley inserted with witness RN, Harden Mo.  Patient educated on foley care, turning at least every 2 hours, and necessity of staying dry in order for skin to improve.

## 2021-08-07 NOTE — Plan of Care (Signed)
Cardiology Brief Plan of Care note:  Discussed with Dr. Eliseo Squires patient's care in regard to loculated pleural effusions and epidural abscess.  Ideally, patients remain on anticoagulation ~ 30 days after DCCV in order to decrease stroke risk.  Given is need for significant surgery and procedures and that he would have significant ricks if these were not down, the risk vs benefits would favor heparin and stopping as need per surgery and proceduralists for these infection control procedures.  He has been controled in rhythm with amiodarone loading and maintenance. - DOAC-> Heparin.   Rudean Haskell, MD [place], [address] [place], [place] [address] [phone]  4:33 PM

## 2021-08-07 NOTE — Anesthesia Preprocedure Evaluation (Addendum)
Anesthesia Evaluation  Patient identified by MRN, date of birth, ID band Patient awake    Reviewed: Allergy & Precautions, NPO status   Airway Mallampati: II       Dental   Pulmonary shortness of breath,    breath sounds clear to auscultation       Cardiovascular hypertension, +CHF   Rhythm:Regular Rate:Normal     Neuro/Psych  Neuromuscular disease    GI/Hepatic negative GI ROS, Neg liver ROS,   Endo/Other  diabetes  Renal/GU Renal disease     Musculoskeletal  (+) Arthritis ,   Abdominal   Peds  Hematology   Anesthesia Other Findings   Reproductive/Obstetrics                             Anesthesia Physical Anesthesia Plan  ASA: 3  Anesthesia Plan: General   Post-op Pain Management:    Induction: Intravenous  PONV Risk Score and Plan: Ondansetron, Dexamethasone and Midazolam  Airway Management Planned: LMA  Additional Equipment:   Intra-op Plan:   Post-operative Plan: Extubation in OR  Informed Consent: I have reviewed the patients History and Physical, chart, labs and discussed the procedure including the risks, benefits and alternatives for the proposed anesthesia with the patient or authorized representative who has indicated his/her understanding and acceptance.     Dental advisory given  Plan Discussed with: CRNA and Anesthesiologist  Anesthesia Plan Comments:       Anesthesia Quick Evaluation

## 2021-08-07 NOTE — Progress Notes (Signed)
ANTICOAGULATION CONSULT NOTE - Initial Consult  Pharmacy Consult:  Heparin Indication: atrial fibrillation  Allergies  Allergen Reactions   Bactrim [Sulfamethoxazole-Trimethoprim]    Ceprotin [Protein C Concentrate (Human)]    Ciprofloxacin Other (See Comments)    Kidney function   Levaquin [Levofloxacin]     Patient Measurements: Height: 6\' 3"  (190.5 cm) Weight: 107 kg (235 lb 14.3 oz) IBW/kg (Calculated) : 84.5 Heparin Dosing Weight: 106 kg  Vital Signs: Temp: 97.8 F (36.6 C) (12/01 1520) Temp Source: Oral (12/01 1520) BP: 97/62 (12/01 1520) Pulse Rate: 67 (12/01 1520)  Labs: Recent Labs    08/05/21 0214 08/06/21 0158 08/07/21 0302  HGB  --  12.4*  --   HCT  --  38.2*  --   PLT  --  413*  --   CREATININE 1.53* 1.72* 1.52*    Estimated Creatinine Clearance: 72.6 mL/min (A) (by C-G formula based on SCr of 1.52 mg/dL (H)).  Assessment: 55 YOM history of on Eliquis, last dose 12/1 around 0830.  MRI showed loculated pleural effusions abscess with progression of discitis/osteomyelitis.  Pharmacy consulted to transition patient to IV heparin due to need for surgery/procedures.  CBC stable; no bleeding reported.  Patient was previously sub-therapeutic on heparin at 1700 units/hr.  Goal of Therapy:  aPTT 66-102 seconds Monitor platelets by anticoagulation protocol: Yes   Plan:  At 2030, initiate IV heparin at 2000 units/hr - no bolus d/t recent Eliquis administration Check 6 hr heparin level and aPTT Daily heparin level, aPTT and CBC  Alessandria Henken D. Mina Marble, PharmD, BCPS, [place] 08/07/2021, 4:38 PM

## 2021-08-07 NOTE — Progress Notes (Addendum)
PROGRESS NOTE  [name]  GUY:[id number] DOB: [birth year] DOA: 07/21/2021 PCP: Patient, No Pcp Per (Inactive)   Brief Narrative: 55 year old male with history of diabetes mellitus type 2, diabetic foot infection with prior right BKA, chronic diastolic CHF, hypertension, dyslipidemia presented with worsening left foot swelling/discoloration and discharge.  On presentation, he was hypotensive, tachycardic with A. fib with RVR with CT suggestive of first MTP osteomyelitis and soft tissue gas concerning for possible necrotizing fasciitis along with AKI with creatinine of 2.7 and mild DKA.  His blood pressures did not improve with IV fluids and he was transferred to ICU and PCCM was consulted.  Orthopedics was consulted.  He underwent left transtibial amputation on 07/23/2021.  He was found to have strep agalactiae bacteremia.  ID was consulted.  He was found to have vertebral discitis versus osteomyelitis on MRI.  He was also found to have new systolic dysfunction with LVEF of 35% along with new onset atrial flutter requiring IV amiodarone which has been switched to oral.  Cardiology was consulted.  He has been started on Eliquis.  S/p TEE followed by DCCV on 11/22.  Developed worsening back pain 11/26-11/27, ID reconsulted, requested MRI T/L-spine with contrast. This requires general anesthesia and was performed 11/29. Unfortunately diagnostic images could not be obtained.  Repeat MRI done 12/1 and shows:  1. Significant progression of discitis and osteomyelitis at T5 through T8. There is edema extending into the posterior elements. 2. Extensive posterior epidural abscess has progressed since the prior study. Abscess extends from T3 through T9 and is exerting mass-effect on the cord with moderate spinal stenosis T4 through T8 3. Interval development of moderately large bilateral loculated pleural effusions. Possible empyema versus reactive fluid.   Assessment & Plan: Principal Problem:   Discitis  of thoracic region Active Problems:   Essential hypertension   Stage 3a chronic kidney disease (HCC)   Type 2 diabetes mellitus with diabetic polyneuropathy, with long-term current use of insulin (HCC)   Right below-knee amputee (HCC)   Diabetic infection of left foot (HCC)   Chronic diastolic (congestive) heart failure (HCC)   Severe sepsis with acute organ dysfunction (HCC)   Chest pain   AKI (acute kidney injury) ([place])   Gas gangrene of foot (HCC)   Atrial flutter (HCC)   Septic shock (HCC)   Systolic dysfunction   Pressure injury of skin   MSSA bacteremia   Septic shock: Evolving on admission from bacteremia/diabetic foot infection -Off pressors.  Shock resolved. -Required care in ICU under PCCM service: Care has been transferred to [facility] on 07/27/2021   Diabetic foot infection, left: Status post left transtibial amputation on 07/23/2021. - Wound/wound VAC care as per Dr. Sharol Given.    Group B streptococcus bacteremia, worsening thoracic discitis/osteomyelitis.  -No vegetation on TTE or TEE during cardioversion.   -MRI 12/1: 1. Significant progression of discitis and osteomyelitis at T5 through T8. There is edema extending into the posterior elements. 2. Extensive posterior epidural abscess has progressed since the prior study. Abscess extends from T3 through T9 and is exerting mass-effect on the cord with moderate spinal stenosis T4 through T8 3. Interval development of moderately large bilateral loculated pleural effusions. Possible empyema versus reactive fluid. - Neurosurgery res-consulted on 12/1- spoke with Dr. Ronnald Ramp -patient moving both legs -ID notified of MRI findings and abx changed on 12/1: vancomycin and cefepime -blood cultures from 07/24/2021 were negative   AKI on stage IIIa CKD: AKI resolving with limitation on diuresis. Had coronary CTA done  earlier this year, no implanted devices. Discussed risk of NSF to which the patient confirms understanding and gives  consent to proceed with contrasted MRI.  -Cr stable -recheck labs in AM  Pt takes testosterone (not prescription):  - total is low -free pending -can follow up outpatient   Hyponatremia:  -stable  Pleural effusion/?loculation -IR consult for thoracentesis/chest tube -culture   New onset systolic CHF: Echo showed EF of 35% (previous echo showed EF of 50 to 55% in 04/2021). Cardiomyopathy may be from new onset atrial flutter RVR related tachycardia versus stress induced cardiomyopathy from bacteremia and septic shock. - per cards (signed off 11/30): BB, Hydralazine/Imdur, Torsemide (outpatient diuretic was lasix 40 mg PO daily) -hold demadex on 12/1 as he was NPO and BP on lower side  New onset atrial flutter: Maintained NSR since DCCV 11/22 - Continue metoprolol succinate 50 Mg daily. - Continue amiodarone 200mg  twice daily x7 days and then 200 Mg daily. - spoke with cards: Ideally, patients remain on anticoagulation ~ 30 days after DCCV in order to decrease stroke risk.  Given is need for significant surgery and procedures and that he would have significant ricks if these were not down, the risk vs benefits would favor heparin and stopping as need per surgery and proceduralists for these infection control procedures.  He has been controled in rhythm with amiodarone loading and maintenance. - DOAC-> Heparin.   Hypomagnesemia: Improved with supplementation.  - Follow periodically.   Leukocytosis: Continues improving.    T2DM uncontrolled with hyperglycemia:  - Increased Levemir, continue NovoLog 3 times daily with meals and SSI.  Further titrate insulins as needed.  Thrombocytopenia - Resolved   Generalized deconditioning - PT recommends CIR which will be pursued once work up has been completed.    Acute on chronic pain: Follows with Dr. Nelva Bush, pain management.  Has received steroid injections to the back.  - Reviewed [place] PDMP and patient regularly fills opioid prescriptions.  Likely opioid-dependent. - Obviously has reasons for acute pain from recent surgery, discitis/osteomyelitis. - Initiated OxyContin 10 Mg every 12 hours on 11/23.  This was increased to 15 mg every 12 hours on 11/27 - Evaluating etiology for worsening back pain as noted above.   Patient on following regimen after making changes: - Tylenol 1 g 3 times daily. - Oxy IR 10 mg every 4 hourly as needed for moderate pain - OxyContin 15 Mg every 12 hours - Lidocaine patch increased to 2 patches - Dilaudid 0.5 mg IV every 4 hourly as needed severe pain - Gabapentin 300 mg 3 times daily   Insomnia - Started trazodone 50 Mg at bedtime as needed   RN Pressure Injury Documentation: Pressure Injury 07/27/21 Sacrum Mid Stage 2 -  Partial thickness loss of dermis presenting as a shallow open injury with a red, pink wound bed without slough. small pink pressure ulcer with break in skin (Active)  07/27/21 0255  Location: Sacrum  Location Orientation: Mid  Staging: Stage 2 -  Partial thickness loss of dermis presenting as a shallow open injury with a red, pink wound bed without slough.  Wound Description (Comments): small pink pressure ulcer with break in skin  Present on Admission: Yes  -placed foley for now to aid with healing   Severe protein calorie malnutrition Nutrition Status: Nutrition Problem: Increased nutrient needs Etiology: wound healing Signs/Symptoms: estimated needs Interventions: Glucerna shake, Premier Protein, Juven, MVI    DVT prophylaxis: Eliquis Code Status: Full Disposition Plan:  Status is: Inpatient  Remains inpatient appropriate because: sick  Consultants:  ID Neurosurgery PCCM Cardiology  Procedures:  Left TTA 07/23/2021 TEE/DCCV 07/29/2021    Subjective: Just back from MRI- confused initially but improved with time and talking  Objective: Vitals:   08/07/21 1420 08/07/21 1435 08/07/21 1450 08/07/21 1520  BP: (!) 90/58 (!) 99/51 (!) 99/58 97/62   Pulse: 72 73 74 67  Resp: (!) 24 (!) 21 12 14   Temp: 97.9 F (36.6 C)  98 F (36.7 C) 97.8 F (36.6 C)  TempSrc:    Oral  SpO2: 92% 93% 93% 96%  Weight:      Height:        Intake/Output Summary (Last 24 hours) at 08/07/2021 1730 Last data filed at 08/07/2021 1412 Gross per 24 hour  Intake 1885.25 ml  Output 4700 ml  Net -2814.75 ml   Filed Weights   07/27/21 0252 07/29/21 0754 08/01/21 0459  Weight: 108.2 kg 108.2 kg 107 kg     General: Appearance:     Overweight male in no acute distress     Lungs:    respirations unlabored  Heart:    Normal heart rate.    MS:   Below knee amputation of right lower extremity is noted. Left below knee amputation noted.   Neurologic:   Initially confused but by end of exam was able to answer all questions appropriately       Data Reviewed: I have personally reviewed following labs and imaging studies  CBC: Recent Labs  Lab 08/01/21 0259 08/04/21 0221 08/06/21 0158  WBC 13.9* 11.9* 10.8*  NEUTROABS  --   --  9.9*  HGB 13.5 12.4* 12.4*  HCT 41.5 38.8* 38.2*  MCV 86.1 88.8 89.0  PLT 349 386 154*   Basic Metabolic Panel: Recent Labs  Lab 08/01/21 0259 08/01/21 1634 08/02/21 0232 08/03/21 0204 08/04/21 0221 08/05/21 0214 08/06/21 0158 08/07/21 0302  NA 129*  --  127* 129* 131* 129* 128* 133*  K 4.2  --  3.9 3.8 4.0 4.4 4.8 4.0  CL 89*  --  84* 89* 91* 90* 92* 95*  CO2 33*  --  35* 34* 34* 30 28 31   GLUCOSE 286*  --  243* 262* 267* 208* 378* 196*  BUN 29*  --  38* 43* 37* 35* 43* 35*  CREATININE 1.52*  --  1.77* 1.81* 1.72* 1.53* 1.72* 1.52*  CALCIUM 8.1*  --  8.3* 8.2* 8.4* 8.3* 8.1* 8.4*  MG 1.5* 1.9 1.7 1.8  --   --   --   --    GFR: Estimated Creatinine Clearance: 72.6 mL/min (A) (by C-G formula based on SCr of 1.52 mg/dL (H)). Liver Function Tests: Recent Labs  Lab 08/02/21 0232  AST 33  ALT 33  ALKPHOS 77  BILITOT 0.7  PROT 5.4*  ALBUMIN <1.5*   No results for input(s): LIPASE, AMYLASE in the last  168 hours. No results for input(s): AMMONIA in the last 168 hours. Coagulation Profile: No results for input(s): INR, PROTIME in the last 168 hours. Cardiac Enzymes: No results for input(s): CKTOTAL, CKMB, CKMBINDEX, TROPONINI in the last 168 hours. BNP (last 3 results) No results for input(s): PROBNP in the last 8760 hours. HbA1C: No results for input(s): HGBA1C in the last 72 hours. CBG: Recent Labs  Lab 08/06/21 2116 08/07/21 0737 08/07/21 0929 08/07/21 1416 08/07/21 1554  GLUCAP 240* 130* 125* 131* 147*   Lipid Profile: No results for input(s): CHOL, HDL, LDLCALC, TRIG, CHOLHDL, LDLDIRECT in  the last 72 hours. Thyroid Function Tests: No results for input(s): TSH, T4TOTAL, FREET4, T3FREE, THYROIDAB in the last 72 hours. Anemia Panel: No results for input(s): VITAMINB12, FOLATE, FERRITIN, TIBC, IRON, RETICCTPCT in the last 72 hours. Urine analysis:    Component Value Date/Time   COLORURINE YELLOW 07/22/2021 0250   APPEARANCEUR CLOUDY (A) 07/22/2021 0250   LABSPEC 1.018 07/22/2021 0250   PHURINE 5.0 07/22/2021 0250   GLUCOSEU >=500 (A) 07/22/2021 0250   HGBUR SMALL (A) 07/22/2021 0250   BILIRUBINUR NEGATIVE 07/22/2021 0250   KETONESUR 5 (A) 07/22/2021 0250   PROTEINUR 30 (A) 07/22/2021 0250   UROBILINOGEN 0.2 09/13/2019 0906   NITRITE NEGATIVE 07/22/2021 0250   LEUKOCYTESUR TRACE (A) 07/22/2021 0250   No results found for this or any previous visit (from the past 240 hour(s)).    Radiology Studies: MR THORACIC SPINE W WO CONTRAST  Result Date: 08/07/2021 CLINICAL DATA:  Mid back pain. Strep bacteremia. Suspected thoracic osteomyelitis. On antibiotics. EXAM: MRI THORACIC WITHOUT AND WITH CONTRAST TECHNIQUE: Multiplanar and multiecho pulse sequences of the thoracic spine were obtained without and with intravenous contrast. CONTRAST:  2mL GADAVIST GADOBUTROL 1 MMOL/ML IV SOLN COMPARISON:  MRI thoracic spine without contrast 07/23/2021 FINDINGS: Alignment:  Normal  Vertebrae: Progressive bone marrow edema and enhancement throughout the T5, T6, T7, and T8 vertebral bodies. Progressive nonenhancing tissue centered at the T6-7 disc space extending in to T6 and T7 likely due to discitis and adjacent osteomyelitis. Bone marrow edema also extends into the posterior elements at T5 and T6 consistent with extension of osteomyelitis. No fracture identified. Cord: Significant progression of posterior epidural multilocular fluid collection which is compressing the cord and causing spinal stenosis. This is most compatible with epidural abscess which extends from T3 through T9. Moderate spinal stenosis T4 through T8. Paraspinal and other soft tissues: Moderately large loculated pleural effusions bilaterally which are centered at the level of discitis osteomyelitis. Possible reactive effusion or empyema. Paraspinous soft tissue thickening is also present around the vertebra T5 through T8 due to infection. Disc levels: Disc degeneration T5 through T10 with disc space narrowing. Posterior osteophyte at T5-6, T6-7, T7-8, T8-9. IMPRESSION: 1. Significant progression of discitis and osteomyelitis at T5 through T8. There is edema extending into the posterior elements. 2. Extensive posterior epidural abscess has progressed since the prior study. Abscess extends from T3 through T9 and is exerting mass-effect on the cord with moderate spinal stenosis T4 through T8 3. Interval development of moderately large bilateral loculated pleural effusions. Possible empyema versus reactive fluid. 4. These results were called by telephone at the time of interpretation on 08/07/2021 at 3:12 pm to provider Eliseo Squires, who verbally acknowledged these results. Electronically Signed   By: Franchot Gallo M.D.   On: 08/07/2021 15:18   MR Lumbar Spine W Wo Contrast  Result Date: 08/07/2021 CLINICAL DATA:  Low back pain.  Suspect infection EXAM: MRI LUMBAR SPINE WITHOUT AND WITH CONTRAST TECHNIQUE: Multiplanar and multiecho  pulse sequences of the lumbar spine were obtained without and with intravenous contrast. CONTRAST:  27mL GADAVIST GADOBUTROL 1 MMOL/ML IV SOLN COMPARISON:  MRI lumbar spine 07/23/2021 FINDINGS: Segmentation:  Standard Alignment:  Normal Vertebrae: Negative for fracture or mass. Moderate discogenic edema in the bone marrow at L5-S1 with associated disc space narrowing. Small amount of fluid in the disc space. Minimal enhancement of the endplates. Findings most compatible with disc degeneration. Conus medullaris and cauda equina: Conus extends to the L1 level. Conus and cauda equina appear normal. Paraspinal  and other soft tissues: Diffuse paraspinous muscle edema without abscess. Findings compatible with myositis. Small left renal cysts. Disc levels: L1-2: Mild disc bulging.  Negative for stenosis L2-3: Shallow central disc protrusion.  Negative for stenosis L3-4: Mild disc bulging.  Negative for stenosis L4-5: Shallow broad-based disc protrusion and mild facet degeneration. Negative for stenosis L5-S1. Advanced disc degeneration with disc space narrowing. Diffuse endplate spurring. Bone marrow edema most compatible with disc degeneration. Small central disc protrusion is unchanged. Mild facet degeneration. Moderate subarticular and foraminal stenosis bilaterally. IMPRESSION: 1. No evidence of discitis/osteomyelitis in the lumbar spine. There is progressive muscle edema in the paraspinous muscles bilaterally compatible with myositis. No abscess 2. Advanced disc degeneration at L5-S1 with discogenic bone marrow edema. Electronically Signed   By: Franchot Gallo M.D.   On: 08/07/2021 14:51    Scheduled Meds:  acetaminophen  1,000 mg Oral TID   amiodarone  200 mg Oral Daily   vitamin C  1,000 mg Oral Daily   docusate sodium  100 mg Oral Daily   feeding supplement (GLUCERNA SHAKE)  237 mL Oral TID BM   gabapentin  300 mg Oral TID   hydrALAZINE  25 mg Oral Q8H   insulin aspart  0-15 Units Subcutaneous TID WC    insulin aspart  0-5 Units Subcutaneous QHS   insulin aspart  8 Units Subcutaneous TID WC   insulin detemir  28 Units Subcutaneous BID   isosorbide mononitrate  30 mg Oral Daily   lidocaine  2 patch Transdermal Q24H   methocarbamol  500 mg Oral TID   metoprolol succinate  50 mg Oral Daily   multivitamin with minerals  1 tablet Oral Daily   nutrition supplement (JUVEN)  1 packet Oral BID BM   oxyCODONE  15 mg Oral Q12H   polyethylene glycol  17 g Oral Daily   Ensure Max Protein  11 oz Oral Daily   torsemide  20 mg Oral Daily   Continuous Infusions:  sodium chloride 10 mL/hr at 07/29/21 0816   ceFEPime (MAXIPIME) IV 2 g (08/07/21 1717)   heparin     lactated ringers Stopped (07/23/21 1013)   vancomycin 2,000 mg (08/07/21 1654)   [START ON 08/08/2021] vancomycin       LOS: 16 days   Time spent: 55 minutes.  Geradine Girt, DO [facility] [email or web address] 08/07/2021, 5:30 PM

## 2021-08-07 NOTE — Plan of Care (Signed)
MRI showed loculated pleural effusions abscess T3-T9, progression of discitis/osteomyelitis at T5-T8. -D/C penicillin IV -Start vancomycin and cefepime -Re-engage neurosurgery -Obtain dedicated chest CT -Obtain thoracentesis vs chest tube with cultures

## 2021-08-07 NOTE — Consult Note (Addendum)
[facility] Nurse Consult Note: Reason for Consult: sacral wound Attempted to see patient this AM however the tech had just finished turning him and changing the pad under him and he did not want to turn again. Patient asked if I could wait until after his MRI to come back and look. Agreed and will attempt to see after procedure.   1453: patient still in PACU. Will FU Friday AM to evalulate and assess the penile ulcers/decubitus consult that was placed by Dr. Golden Pop L. Tamala Julian, MSN, RN, [place], Lysle Pearl, [facility] Wound Treatment Associate Pager [phone]

## 2021-08-07 NOTE — Progress Notes (Signed)
Subjective: I was called by Dr.  Eliseo Squires to reevaluate the patient because he had his MRI with and without contrast today which shows worsening of his osteomyelitis and discitis with now posterior epidural abscess from about T4-T8.  He is not complaining of leg pain or numbness or tingling or weakness in the legs.  Most of his pain is in his low back and not in his thoracic region.  He does have significant pain with any movement.  He is now on vancomycin and cefepime.  He has been on Eliquis and took his last dose this morning and was converted to heparin.  Objective: Vital signs in last 24 hours: Temp:  [97.6 F (36.4 C)-98.3 F (36.8 C)] 97.8 F (36.6 C) (12/01 1520) Pulse Rate:  [64-74] 67 (12/01 1520) Resp:  [12-24] 14 (12/01 1520) BP: (90-134)/(51-74) 97/62 (12/01 1520) SpO2:  [91 %-99 %] 96 % (12/01 1520)  Intake/Output from previous day: 11/30 0701 - 12/01 0700 In: 885.3 [I.V.:270.2; IV Piggyback:615.1] Out: 0086 [Urine:5500] Intake/Output this shift: Total I/O In: 1000 [I.V.:1000] Out: 2000 [Urine:2000]  He is awake and alert and interactive.  He describes low back pain with movement, but moves his legs well in bed.  He has had bilateral BKA's.  The left BKA is wrapped in a black wrap.  He moves his arms well with good strength and power and normal sensation.  He has a catheter in place.  Lab Results: Lab Results  Component Value Date   WBC 10.8 (H) 08/06/2021   HGB 12.4 (L) 08/06/2021   HCT 38.2 (L) 08/06/2021   MCV 89.0 08/06/2021   PLT 413 (H) 08/06/2021   Lab Results  Component Value Date   INR 1.6 (H) 07/28/2021   BMET Lab Results  Component Value Date   NA 133 (L) 08/07/2021   K 4.0 08/07/2021   CL 95 (L) 08/07/2021   CO2 31 08/07/2021   GLUCOSE 196 (H) 08/07/2021   BUN 35 (H) 08/07/2021   CREATININE 1.52 (H) 08/07/2021   CALCIUM 8.4 (L) 08/07/2021    Studies/Results: MR THORACIC SPINE W WO CONTRAST  Result Date: 08/07/2021 CLINICAL DATA:  Mid back  pain. Strep bacteremia. Suspected thoracic osteomyelitis. On antibiotics. EXAM: MRI THORACIC WITHOUT AND WITH CONTRAST TECHNIQUE: Multiplanar and multiecho pulse sequences of the thoracic spine were obtained without and with intravenous contrast. CONTRAST:  86mL GADAVIST GADOBUTROL 1 MMOL/ML IV SOLN COMPARISON:  MRI thoracic spine without contrast 07/23/2021 FINDINGS: Alignment:  Normal Vertebrae: Progressive bone marrow edema and enhancement throughout the T5, T6, T7, and T8 vertebral bodies. Progressive nonenhancing tissue centered at the T6-7 disc space extending in to T6 and T7 likely due to discitis and adjacent osteomyelitis. Bone marrow edema also extends into the posterior elements at T5 and T6 consistent with extension of osteomyelitis. No fracture identified. Cord: Significant progression of posterior epidural multilocular fluid collection which is compressing the cord and causing spinal stenosis. This is most compatible with epidural abscess which extends from T3 through T9. Moderate spinal stenosis T4 through T8. Paraspinal and other soft tissues: Moderately large loculated pleural effusions bilaterally which are centered at the level of discitis osteomyelitis. Possible reactive effusion or empyema. Paraspinous soft tissue thickening is also present around the vertebra T5 through T8 due to infection. Disc levels: Disc degeneration T5 through T10 with disc space narrowing. Posterior osteophyte at T5-6, T6-7, T7-8, T8-9. IMPRESSION: 1. Significant progression of discitis and osteomyelitis at T5 through T8. There is edema extending into the posterior elements.  2. Extensive posterior epidural abscess has progressed since the prior study. Abscess extends from T3 through T9 and is exerting mass-effect on the cord with moderate spinal stenosis T4 through T8 3. Interval development of moderately large bilateral loculated pleural effusions. Possible empyema versus reactive fluid. 4. These results were called by  telephone at the time of interpretation on 08/07/2021 at 3:12 pm to provider Eliseo Squires, who verbally acknowledged these results. Electronically Signed   By: Franchot Gallo M.D.   On: 08/07/2021 15:18   MR Lumbar Spine W Wo Contrast  Result Date: 08/07/2021 CLINICAL DATA:  Low back pain.  Suspect infection EXAM: MRI LUMBAR SPINE WITHOUT AND WITH CONTRAST TECHNIQUE: Multiplanar and multiecho pulse sequences of the lumbar spine were obtained without and with intravenous contrast. CONTRAST:  70mL GADAVIST GADOBUTROL 1 MMOL/ML IV SOLN COMPARISON:  MRI lumbar spine 07/23/2021 FINDINGS: Segmentation:  Standard Alignment:  Normal Vertebrae: Negative for fracture or mass. Moderate discogenic edema in the bone marrow at L5-S1 with associated disc space narrowing. Small amount of fluid in the disc space. Minimal enhancement of the endplates. Findings most compatible with disc degeneration. Conus medullaris and cauda equina: Conus extends to the L1 level. Conus and cauda equina appear normal. Paraspinal and other soft tissues: Diffuse paraspinous muscle edema without abscess. Findings compatible with myositis. Small left renal cysts. Disc levels: L1-2: Mild disc bulging.  Negative for stenosis L2-3: Shallow central disc protrusion.  Negative for stenosis L3-4: Mild disc bulging.  Negative for stenosis L4-5: Shallow broad-based disc protrusion and mild facet degeneration. Negative for stenosis L5-S1. Advanced disc degeneration with disc space narrowing. Diffuse endplate spurring. Bone marrow edema most compatible with disc degeneration. Small central disc protrusion is unchanged. Mild facet degeneration. Moderate subarticular and foraminal stenosis bilaterally. IMPRESSION: 1. No evidence of discitis/osteomyelitis in the lumbar spine. There is progressive muscle edema in the paraspinous muscles bilaterally compatible with myositis. No abscess 2. Advanced disc degeneration at L5-S1 with discogenic bone marrow edema. Electronically  Signed   By: Franchot Gallo M.D.   On: 08/07/2021 14:51    Assessment/Plan: I have reviewed his imaging.  Comparing it to his previous imaging 2 weeks ago that did not include contrast I suspect this epidural abscess has been there all along and it was described as epidural fat before.  Certainly the osteomyelitis and the discitis is worse.  No kyphosis at this time.    I have gone over the imaging with 4 of my partners.  2 of them have recommended no surgery and 3 of [place] have felt surgery was reasonable.  I had planned to do a T4-T8 posterior laminectomy tomorrow for evacuation of epidural abscess, though I suspect it is more phlegmon than abscess.  I had ordered a CT scan of the thoracic spine and n.p.o. status and a consent.  I will cancel these. given the fact that he took Eliquis this morning he has to wait at least 3 days to have surgery as the risks now far outweigh the potential benefits.  He has no neurologic deficit at this time.  Seeing as how the epidural abscess was probably there 2 weeks ago and now his antibiotics have been changed and he cannot have decompressive surgery for 3 days, I suspect that repeat imaging with an MRI with and without contrast if possible can be done sooner rather than later and maybe surgical intervention can be avoided altogether.  I explained to him and his ex-wife Juliann Pulse the indications for the surgery and the  reasoning I was offering the surgery prior to canceling it, they understand that the surgery was to decompress the cord and remove as much epidural abscess or phlegmon as we could protect the cord from compression and protect from neurologic deficit but would not necessarily help his pain or help control the infection.  He understands that if antibiotics do not work to control this infection, which they have not done so so far, that even after surgery the abscess would simply come back, the osteomyelitis would continue and he would likely develop deformity.  He is  pleased at the addition of vancomycin as he has confidence in this medication from previous issues that he has had.  I have gone back and talked  to him about the Eliquis and the reasoning for canceling the surgery and he has demonstrated understanding and he seems relieved.  If he were to develop neurologic deficit then we could consider some type of reversal agent if possible and emergency surgery, but I think this is unlikely as I think this imaging finding has likely been there for a couple of weeks and the epidural part is likely stable.  Estimated body mass index is 29.48 kg/m as calculated from the following:   Height as of this encounter: 6\' 3"  (1.905 m).   Weight as of this encounter: 107 kg.    LOS: 16 days    Eustace Moore 08/07/2021, 6:12 PM

## 2021-08-07 NOTE — Transfer of Care (Signed)
Immediate Anesthesia Transfer of Care Note  Patient: Mandeep Kiser  Procedure(s) Performed: MRI WITH LUMBER WITH AND WITHOUT CONTRAST,THORACIC WITH AND WITHOUT CONTRAST  Patient Location: PACU  Anesthesia Type:General  Level of Consciousness: drowsy and patient cooperative  Airway & Oxygen Therapy: Patient Spontanous Breathing and Patient connected to face mask oxygen  Post-op Assessment: Report given to RN and Post -op Vital signs reviewed and stable  Post vital signs: Reviewed and stable  Last Vitals:  Vitals Value Taken Time  BP    Temp    Pulse 73 08/07/21 1418  Resp 19 08/07/21 1418  SpO2 92 % 08/07/21 1418  Vitals shown include unvalidated device data.  Last Pain:  Vitals:   08/07/21 0826  TempSrc: Oral  PainSc: 0-No pain      Patients Stated Pain Goal: 0 (15/04/13 6438)  Complications: No notable events documented.

## 2021-08-07 NOTE — Anesthesia Procedure Notes (Signed)
Procedure Name: Intubation Date/Time: 08/07/2021 10:39 AM Performed by: Lance Coon, CRNA Pre-anesthesia Checklist: Patient identified, Timeout performed, Emergency Drugs available, Suction available and Patient being monitored Patient Re-evaluated:Patient Re-evaluated prior to induction Oxygen Delivery Method: Circle system utilized Preoxygenation: Pre-oxygenation with 100% oxygen Induction Type: IV induction Ventilation: Mask ventilation without difficulty Laryngoscope Size: Miller and 3 Grade View: Grade II Tube type: Oral Tube size: 7.5 mm Number of attempts: 1 Airway Equipment and Method: Stylet Placement Confirmation: ETT inserted through vocal cords under direct vision, positive ETCO2 and breath sounds checked- equal and bilateral Secured at: 23 cm Tube secured with: Tape Dental Injury: Teeth and Oropharynx as per pre-operative assessment

## 2021-08-07 NOTE — Progress Notes (Addendum)
Pharmacy Antibiotic Note  [name] is a 55 y.o. male admitted on 07/21/2021 with  Osteo + PNA .  Pharmacy has been consulted for Vanco, Cefepime dosing.  ID: PNA, spinal abscesses with osteo. -  afeb, wbc 10.8 down. Scr 1.52 trending down. - CT foot: extensive subcutaneous gas, septic arthritis/osteo of 1st MTP TEE neg for veg - 12/1: MRI showed loculated pleural effusions abscess T3-T9, progression of discitis/osteomyelitis at T5-T8.  11/14 Ceftriaxone x1 11/14 Vancomycin >> 11/15, 12/1>> 12/1 Cefepime>> 11/15 Zosyn >> 11/16 11/15 Clindamycin >>11/15 11/15 Linezolid>> 11/16 11/16 Rocephin>>11/18 11/18 Penicillin G>>12/1 by ID (2 weeks IV therapy, then 4-6 weeks amox until 09/14/21)  11/17 BCx: ng 11/16 MRS PCR + 11/14 BCx: GBS pan S 11/15 UCx: GBS   Plan: Cefepime 2g IV q8hr Vanco 2g IV x 1 Vancomycin 750 mg IV Q 8 hrs. Goal AUC 400-550. Expected AUC: 496 SCr used: 1.52 Monitor for dose increases requiring with improving Scr Peak trough after 3-5 doses at steady state.    Height: 6\' 3"  (190.5 cm) Weight: 107 kg (235 lb 14.3 oz) IBW/kg (Calculated) : 84.5  Temp (24hrs), Avg:97.9 F (36.6 C), Min:97.6 F (36.4 C), Max:98.3 F (36.8 C)  Recent Labs  Lab 08/01/21 0259 08/02/21 0232 08/03/21 0204 08/04/21 0221 08/05/21 0214 08/06/21 0158 08/07/21 0302  WBC 13.9*  --   --  11.9*  --  10.8*  --   CREATININE 1.52*   < > 1.81* 1.72* 1.53* 1.72* 1.52*   < > = values in this interval not displayed.    Estimated Creatinine Clearance: 72.6 mL/min (A) (by C-G formula based on SCr of 1.52 mg/dL (H)).    Allergies  Allergen Reactions   Bactrim [Sulfamethoxazole-Trimethoprim]    Ceprotin [Protein C Concentrate (Human)]    Ciprofloxacin Other (See Comments)    Kidney function   Levaquin [Levofloxacin]     Tonianne Fine S. Alford Highland, PharmD, BCPS Clinical Staff Pharmacist [email or web address]  Gared, Gillie 08/07/2021 4:07 PM

## 2021-08-08 ENCOUNTER — Inpatient Hospital Stay (HOSPITAL_COMMUNITY): Payer: Medicare HMO

## 2021-08-08 ENCOUNTER — Encounter (HOSPITAL_COMMUNITY)
Admission: EM | Disposition: A | Discharge status: Skilled Nursing Facility | Payer: Self-pay | Source: Home / Self Care | Attending: Internal Medicine

## 2021-08-08 ENCOUNTER — Encounter (HOSPITAL_COMMUNITY): Payer: Self-pay | Admitting: Radiology

## 2021-08-08 DIAGNOSIS — M4644 Discitis, unspecified, thoracic region: Secondary | ICD-10-CM | POA: Diagnosis not present

## 2021-08-08 LAB — BASIC METABOLIC PANEL
Anion gap: 8 (ref 5–15)
BUN: 37 mg/dL — ABNORMAL HIGH (ref 6–20)
CO2: 27 mmol/L (ref 22–32)
Calcium: 7.8 mg/dL — ABNORMAL LOW (ref 8.9–10.3)
Chloride: 94 mmol/L — ABNORMAL LOW (ref 98–111)
Creatinine, Ser: 1.54 mg/dL — ABNORMAL HIGH (ref 0.61–1.24)
GFR, Estimated: 53 mL/min — ABNORMAL LOW (ref >60)
Glucose, Bld: 288 mg/dL — ABNORMAL HIGH (ref 70–99)
Potassium: 4.2 mmol/L (ref 3.5–5.1)
Sodium: 129 mmol/L — ABNORMAL LOW (ref 135–145)

## 2021-08-08 LAB — GLUCOSE, CAPILLARY
Glucose-Capillary: 302 mg/dL — ABNORMAL HIGH (ref 70–99)
Glucose-Capillary: 244 mg/dL — ABNORMAL HIGH (ref 70–99)
Glucose-Capillary: 152 mg/dL — ABNORMAL HIGH (ref 70–99)
Glucose-Capillary: 233 mg/dL — ABNORMAL HIGH (ref 70–99)

## 2021-08-08 LAB — APTT
aPTT: 44 seconds — ABNORMAL HIGH (ref 24–36)
aPTT: 52 seconds — ABNORMAL HIGH (ref 24–36)
aPTT: 57 seconds — ABNORMAL HIGH (ref 24–36)

## 2021-08-08 LAB — CBC
HCT: 37.8 % — ABNORMAL LOW (ref 39.0–52.0)
Hemoglobin: 12.2 g/dL — ABNORMAL LOW (ref 13.0–17.0)
MCH: 28.8 pg (ref 26.0–34.0)
MCHC: 32.3 g/dL (ref 30.0–36.0)
MCV: 89.2 fL (ref 80.0–100.0)
Platelets: 360 10*3/uL (ref 150–400)
RBC: 4.24 MIL/uL (ref 4.22–5.81)
RDW: 14.6 % (ref 11.5–15.5)
WBC: 7.9 10*3/uL (ref 4.0–10.5)
nRBC: 0 % (ref 0.0–0.2)

## 2021-08-08 LAB — HEPARIN LEVEL (UNFRACTIONATED)
Heparin Unfractionated: 0.67 IU/mL (ref 0.30–0.70)
Heparin Unfractionated: 0.43 IU/mL (ref 0.30–0.70)

## 2021-08-08 LAB — TESTOSTERONE, FREE: Testosterone, Free: 4 pg/mL — ABNORMAL LOW (ref 7.2–24.0)

## 2021-08-08 LAB — MAGNESIUM: Magnesium: 1.6 mg/dL — ABNORMAL LOW (ref 1.7–2.4)

## 2021-08-08 SURGERY — THORACIC LAMINECTOMY FOR TUMOR
Anesthesia: General

## 2021-08-08 MED ORDER — TORSEMIDE 20 MG PO TABS: 20.0000 mg | ORAL_TABLET | Freq: Every day | ORAL | Status: DC

## 2021-08-08 MED ORDER — MAGNESIUM SULFATE 2 GM/50ML IV SOLN
2.0000 g | Freq: Once | INTRAVENOUS | Status: AC
  Administered 2021-08-08: 2 g via INTRAVENOUS
  Filled 2021-08-08: qty 50

## 2021-08-08 MED ORDER — HYDROMORPHONE HCL 1 MG/ML IJ SOLN
0.5000 mg | INTRAMUSCULAR | Status: DC | PRN
  Administered 2021-08-08: 1 mg via INTRAVENOUS
  Administered 2021-08-08: 0.5 mg via INTRAVENOUS
  Administered 2021-08-09 – 2021-08-16 (×36): 1 mg via INTRAVENOUS
  Administered 2021-08-16 (×2): 0.5 mg via INTRAVENOUS
  Administered 2021-08-16 – 2021-08-19 (×10): 1 mg via INTRAVENOUS
  Administered 2021-08-19: 0.5 mg via INTRAVENOUS
  Administered 2021-08-19 (×2): 1 mg via INTRAVENOUS
  Administered 2021-08-20: 09:00:00 0.5 mg via INTRAVENOUS
  Administered 2021-08-20 – 2021-08-27 (×34): 1 mg via INTRAVENOUS
  Administered 2021-08-27 (×2): 0.5 mg via INTRAVENOUS
  Administered 2021-08-27 – 2021-08-29 (×10): 1 mg via INTRAVENOUS
  Administered 2021-08-30: 18:00:00 0.5 mg via INTRAVENOUS
  Administered 2021-08-30 – 2021-09-09 (×46): 1 mg via INTRAVENOUS
  Filled 2021-08-08 (×73): qty 1
  Filled 2021-08-08: qty 0.5
  Filled 2021-08-08 (×25): qty 1
  Filled 2021-08-08: qty 0.5
  Filled 2021-08-08 (×16): qty 1
  Filled 2021-08-08: qty 0.5
  Filled 2021-08-08 (×30): qty 1
  Filled 2021-08-08: qty 0.5
  Filled 2021-08-08 (×5): qty 1

## 2021-08-08 MED ORDER — ZINC OXIDE 40 % EX OINT
TOPICAL_OINTMENT | Freq: Every day | CUTANEOUS | Status: DC | PRN
  Filled 2021-08-08 (×2): qty 57

## 2021-08-08 MED ORDER — GLUCERNA SHAKE PO LIQD
237.0000 mL | Freq: Two times a day (BID) | ORAL | Status: DC
  Administered 2021-08-08: 237 mL via ORAL

## 2021-08-08 MED ORDER — HYDROMORPHONE HCL 1 MG/ML IJ SOLN: 1.0000 mg | Freq: Once | INTRAMUSCULAR | Status: DC

## 2021-08-08 MED ORDER — ACYCLOVIR 200 MG PO CAPS
400.0000 mg | ORAL_CAPSULE | Freq: Three times a day (TID) | ORAL | Status: AC
  Administered 2021-08-08 – 2021-08-15 (×21): 400 mg via ORAL
  Filled 2021-08-08 (×21): qty 2

## 2021-08-08 NOTE — Progress Notes (Signed)
Inpatient Diabetes Program Recommendations  AACE/ADA: New Consensus Statement on Inpatient Glycemic Control (2015)  Target Ranges:  Prepandial:   less than 140 mg/dL      Peak postprandial:   less than 180 mg/dL (1-2 hours)      Critically ill patients:  140 - 180 mg/dL   Lab Results  Component Value Date   GLUCAP 302 (H) 08/08/2021   HGBA1C 13.8 (H) 07/22/2021    Review of Glycemic Control  Latest Reference Range & Units 08/07/21 15:54 08/07/21 21:35 08/08/21 07:32  Glucose-Capillary 70 - 99 mg/dL 147 (H) 442 (H) 302 (H)  (H): Data is abnormally high Diabetes history: Type 2 DM Outpatient Diabetes medications: Novolog 70/30 20-30 units BID, Regular ~1:3 CR Current orders for Inpatient glycemic control: Levemir 28 units QD, Novolog 8 units TID, Novolog 0-15 units TID & HS Decadron 10 mg x 1  Inpatient Diabetes Program Recommendations:    Consider increasing Novolog to 10 units TID (assuming patient is consuming >50% of meals).  Blood glucose meter #[id number]. Thanks, Bronson Curb, MSN, RNC-OB Diabetes Coordinator [phone] (8a-5p)

## 2021-08-08 NOTE — Progress Notes (Signed)
Inpatient Rehab Admissions Coordinator:   Note MRI results demonstrate worsening epidural abscess (originally T5-8, now T3-9).  ID and Neurosurgery re-engaged.  Abx broadened due to pleural effusion.  NS recommends observation over the weekend. I will continue to follow for possible/eventual CIR as pt stabilizes and treatment plan is nailed down.    Shann Medal, PT, DPT Admissions Coordinator [phone] 08/08/21  12:51 PM

## 2021-08-08 NOTE — Progress Notes (Signed)
Subjective: Patient reports moderate low back pain, denies NTW in his legs and no pain  Objective: Vital signs in last 24 hours: Temp:  [97.8 F (36.6 C)-99.1 F (37.3 C)] 98.6 F (37 C) (12/02 0500) Pulse Rate:  [57-74] 57 (12/02 0500) Resp:  [12-24] 17 (12/01 2010) BP: (90-132)/(51-64) 132/64 (12/02 0500) SpO2:  [92 %-96 %] 92 % (12/02 0500)  Intake/Output from previous day: 12/01 0701 - 12/02 0700 In: 1408.8 [I.V.:1157.1; IV Piggyback:251.8] Out: 6000 [Urine:6000] Intake/Output this shift: No intake/output data recorded.  Neurologic: Grossly normal BKA bilateral  Lab Results: Lab Results  Component Value Date   WBC 7.9 08/08/2021   HGB 12.2 (L) 08/08/2021   HCT 37.8 (L) 08/08/2021   MCV 89.2 08/08/2021   PLT 360 08/08/2021   Lab Results  Component Value Date   INR 1.6 (H) 07/28/2021   BMET Lab Results  Component Value Date   NA 129 (L) 08/08/2021   K 4.2 08/08/2021   CL 94 (L) 08/08/2021   CO2 27 08/08/2021   GLUCOSE 288 (H) 08/08/2021   BUN 37 (H) 08/08/2021   CREATININE 1.54 (H) 08/08/2021   CALCIUM 7.8 (L) 08/08/2021    Studies/Results: MR THORACIC SPINE W WO CONTRAST  Result Date: 08/07/2021 CLINICAL DATA:  Mid back pain. Strep bacteremia. Suspected thoracic osteomyelitis. On antibiotics. EXAM: MRI THORACIC WITHOUT AND WITH CONTRAST TECHNIQUE: Multiplanar and multiecho pulse sequences of the thoracic spine were obtained without and with intravenous contrast. CONTRAST:  85mL GADAVIST GADOBUTROL 1 MMOL/ML IV SOLN COMPARISON:  MRI thoracic spine without contrast 07/23/2021 FINDINGS: Alignment:  Normal Vertebrae: Progressive bone marrow edema and enhancement throughout the T5, T6, T7, and T8 vertebral bodies. Progressive nonenhancing tissue centered at the T6-7 disc space extending in to T6 and T7 likely due to discitis and adjacent osteomyelitis. Bone marrow edema also extends into the posterior elements at T5 and T6 consistent with extension of osteomyelitis.  No fracture identified. Cord: Significant progression of posterior epidural multilocular fluid collection which is compressing the cord and causing spinal stenosis. This is most compatible with epidural abscess which extends from T3 through T9. Moderate spinal stenosis T4 through T8. Paraspinal and other soft tissues: Moderately large loculated pleural effusions bilaterally which are centered at the level of discitis osteomyelitis. Possible reactive effusion or empyema. Paraspinous soft tissue thickening is also present around the vertebra T5 through T8 due to infection. Disc levels: Disc degeneration T5 through T10 with disc space narrowing. Posterior osteophyte at T5-6, T6-7, T7-8, T8-9. IMPRESSION: 1. Significant progression of discitis and osteomyelitis at T5 through T8. There is edema extending into the posterior elements. 2. Extensive posterior epidural abscess has progressed since the prior study. Abscess extends from T3 through T9 and is exerting mass-effect on the cord with moderate spinal stenosis T4 through T8 3. Interval development of moderately large bilateral loculated pleural effusions. Possible empyema versus reactive fluid. 4. These results were called by telephone at the time of interpretation on 08/07/2021 at 3:12 pm to provider Eliseo Squires, who verbally acknowledged these results. Electronically Signed   By: Franchot Gallo M.D.   On: 08/07/2021 15:18   MR Lumbar Spine W Wo Contrast  Result Date: 08/07/2021 CLINICAL DATA:  Low back pain.  Suspect infection EXAM: MRI LUMBAR SPINE WITHOUT AND WITH CONTRAST TECHNIQUE: Multiplanar and multiecho pulse sequences of the lumbar spine were obtained without and with intravenous contrast. CONTRAST:  46mL GADAVIST GADOBUTROL 1 MMOL/ML IV SOLN COMPARISON:  MRI lumbar spine 07/23/2021 FINDINGS: Segmentation:  Standard Alignment:  Normal Vertebrae: Negative for fracture or mass. Moderate discogenic edema in the bone marrow at L5-S1 with associated disc space  narrowing. Small amount of fluid in the disc space. Minimal enhancement of the endplates. Findings most compatible with disc degeneration. Conus medullaris and cauda equina: Conus extends to the L1 level. Conus and cauda equina appear normal. Paraspinal and other soft tissues: Diffuse paraspinous muscle edema without abscess. Findings compatible with myositis. Small left renal cysts. Disc levels: L1-2: Mild disc bulging.  Negative for stenosis L2-3: Shallow central disc protrusion.  Negative for stenosis L3-4: Mild disc bulging.  Negative for stenosis L4-5: Shallow broad-based disc protrusion and mild facet degeneration. Negative for stenosis L5-S1. Advanced disc degeneration with disc space narrowing. Diffuse endplate spurring. Bone marrow edema most compatible with disc degeneration. Small central disc protrusion is unchanged. Mild facet degeneration. Moderate subarticular and foraminal stenosis bilaterally. IMPRESSION: 1. No evidence of discitis/osteomyelitis in the lumbar spine. There is progressive muscle edema in the paraspinous muscles bilaterally compatible with myositis. No abscess 2. Advanced disc degeneration at L5-S1 with discogenic bone marrow edema. Electronically Signed   By: Franchot Gallo M.D.   On: 08/07/2021 14:51    Assessment/Plan: Patient with epidural abscess T4-T8 with discitis osteomyelitis, patient was on eliquis so surgery was canceled for today. He is on heparin gtt today. Vancomycin and cephepime per ID. Neuro intact. Again, if he makes it to Monday with three days off eliquis and no neurologic compromise one could argue that decompressive surgery is not necessary. All of this has been discussed with him and he is in agreement with this plan.    LOS: 17 days    Ocie Cornfield Nahiem Dredge 08/08/2021, 10:04 AM

## 2021-08-08 NOTE — Progress Notes (Signed)
While doing foley/peri care, cleaning and bathing patient, I noticed patient had several small sore/blistered areas on shaft of penis.I asked patient how long that has been there as he grimaced when I cleaned perineal area. He said he got it after having vaginal intercourse with someone. I asked him how long ago. He stated it has been there for 2 months and the sex was unprotected. He stated he had been trying to treat it with some kind of ointment. I notified Dr. Eliseo Squires and orders received for STD urine and swab sample. I collected and sent to lab. Patient informed.

## 2021-08-08 NOTE — Care Management Important Message (Signed)
Important Message  Patient Details  Name: [name] MRN: [id number] Date of Birth: Dec 22, 1965   Medicare Important Message Given:  Yes     Shelda Altes 08/08/2021, 10:06 AM

## 2021-08-08 NOTE — Progress Notes (Addendum)
Nutrition Follow-up  DOCUMENTATION CODES:   Not applicable  INTERVENTION:   Change Glucerna Shake to BID, each supplement provides 220 kcal and 10 grams of protein.  Continue Ensure Max po once daily, each supplement provides 150 kcal and 30 grams of protein.   Continue Juven po BID, each packet provides 80 calories, 8 grams of carbohydrate, 2.5  grams of protein (collagen), 7 grams of L-arginine and 7 grams of L-glutamine; supplement contains CaHMB, Vitamins C, E, B12 and Zinc to promote wound healing   NUTRITION DIAGNOSIS:   Increased nutrient needs related to wound healing as evidenced by estimated needs.  Ongoing   GOAL:   Patient will meet greater than or equal to 90% of their needs  Progressing   MONITOR:   PO intake, Supplement acceptance, Labs, Weight trends, Skin, I & O's  REASON FOR ASSESSMENT:   Consult Assessment of nutrition requirement/status  ASSESSMENT:   55 yo male with a PMH of diabetes mellitus type 2, diabetic foot infection with prior right BKA, chronic diastolic CHF, hypertension, dyslipidemia presented with worsening left foot swelling/discoloration and discharge. Admitted with discitis of thoracic region.  Unable to speak with patient today.  Plans for d/c to CIR when patient is medically stable.   Patient now has a DTPI to the coccyx/sacrum. He also has an epidural abscess to T4-T8 with discitis osteomyelitis. Turning is difficult for patient d/t pain.   Patient was NPO for surgery this morning, but procedure was postponed, so back on a CHO modified diet this afternoon.   He did not take Glucerna or Juven supplements this morning. He has had an Ensure Max.   Labs reviewed. Na 129, Mag 1.6 CBG: 302-244  Medications reviewed and include vitamin C, colace, novolog, levemir, MVI with minerals, Miralax.   Diet Order:   Diet Order             Diet Carb Modified Fluid consistency: Thin; Room service appropriate? Yes  Diet effective now                    EDUCATION NEEDS:   Education needs have been addressed  Skin:  Skin Assessment: Skin Integrity Issues: Skin Integrity Issues:: DTI DTI: coccyx/sacrum Stage II: Sacrum Incisions: L leg, closed (11/16)  Last BM:  112/1  Height:   Ht Readings from Last 1 Encounters:  07/29/21 6\' 3"  (1.905 m)    Weight:   Wt Readings from Last 1 Encounters:  08/01/21 107 kg    BMI:  Body mass index is 29.48 kg/m.  Estimated Nutritional Needs:   Kcal:  2300-2500  Protein:  135-150 grams  Fluid:  >2.3 L   Lucas Mallow, RD, LDN, CNSC Please refer to [employer] for contact information.

## 2021-08-08 NOTE — Progress Notes (Signed)
PROGRESS NOTE  [name]  JJO:[id number] DOB: 12/18/1965 DOA: 07/21/2021 PCP: Patient, No Pcp Per (Inactive)   Brief Narrative: 55 year old male with history of diabetes mellitus type 2, diabetic foot infection with prior right BKA, chronic diastolic CHF, hypertension, dyslipidemia presented with worsening left foot swelling/discoloration and discharge.  On presentation, he was hypotensive, tachycardic with A. fib with RVR with CT suggestive of first MTP osteomyelitis and soft tissue gas concerning for possible necrotizing fasciitis along with AKI with creatinine of 2.7 and mild DKA.  His blood pressures did not improve with IV fluids and he was transferred to ICU and PCCM was consulted.  Orthopedics was consulted.  He underwent left transtibial amputation on 07/23/2021.  He was found to have strep agalactiae bacteremia.  ID was consulted.  He was found to have vertebral discitis versus osteomyelitis on MRI.  He was also found to have new systolic dysfunction with LVEF of 35% along with new onset atrial flutter requiring IV amiodarone which has been switched to oral.  Cardiology was consulted.  He has been started on Eliquis.  S/p TEE followed by DCCV on 11/22.  Developed worsening back pain 11/26-11/27, ID reconsulted, requested MRI T/L-spine with contrast. This requires general anesthesia and was performed 11/29. Unfortunately diagnostic images could not be obtained.  Repeat MRI done 12/1 and shows:  1. Significant progression of discitis and osteomyelitis at T5 through T8. There is edema extending into the posterior elements. 2. Extensive posterior epidural abscess has progressed since the prior study. Abscess extends from T3 through T9 and is exerting mass-effect on the cord with moderate spinal stenosis T4 through T8 3. Interval development of moderately large bilateral loculated pleural effusions. Possible empyema versus reactive fluid. Attempted thoracentesis on 12/2 but patient not able to  lay in a position to get procedure done-- IR said will need to be done under anesthesia.    Assessment & Plan: Principal Problem:   Discitis of thoracic region Active Problems:   Essential hypertension   Stage 3a chronic kidney disease (HCC)   Type 2 diabetes mellitus with diabetic polyneuropathy, with long-term current use of insulin (HCC)   Right below-knee amputee (HCC)   Diabetic infection of left foot (HCC)   Chronic diastolic (congestive) heart failure (HCC)   Severe sepsis with acute organ dysfunction (HCC)   Chest pain   AKI (acute kidney injury) ([place])   Gas gangrene of foot (HCC)   Atrial flutter (HCC)   Septic shock (HCC)   Systolic dysfunction   Pressure injury of skin   MSSA bacteremia   Septic shock: Evolving on admission from bacteremia/diabetic foot infection -Off pressors.  Shock resolved. -Required care in ICU under PCCM service: Care has been transferred to [facility] on 07/27/2021   Diabetic foot infection, left: Status post left transtibial amputation on 07/23/2021. - Wound/wound VAC care as per Dr. Sharol Given.    Group B streptococcus bacteremia, worsening thoracic discitis/osteomyelitis.  -No vegetation on TTE or TEE during cardioversion.   -MRI 12/1: 1. Significant progression of discitis and osteomyelitis at T5 through T8. There is edema extending into the posterior elements. 2. Extensive posterior epidural abscess has progressed since the prior study. Abscess extends from T3 through T9 and is exerting mass-effect on the cord with moderate spinal stenosis T4 through T8 3. Interval development of moderately large bilateral loculated pleural effusions. Possible empyema versus reactive fluid. - Neurosurgery re-consulted on 12/1- spoke with Dr. Ronnald Ramp- surgery would have to be delayed until next week due to elquis dose  on 12/1 -patient moving both legs -ID notified of MRI findings and abx changed on 12/1: vancomycin and cefepime -blood cultures from 07/24/2021 were  negative -will place PICC line   AKI on stage IIIa CKD: AKI resolving with limitation on diuresis. Had coronary CTA done earlier this year, no implanted devices. Discussed risk of NSF to which the patient confirms understanding and gives consent to proceed with contrasted MRI.  -Cr stable -recheck labs in AM  Pt takes testosterone (not prescription):  - total is low -free pending -can follow up outpatient   Hyponatremia:  -stable  Pleural effusion/?loculation -IR consult for thoracentesis/chest tube -culture with procedure   New onset systolic CHF: Echo showed EF of 35% (previous echo showed EF of 50 to 55% in 04/2021). Cardiomyopathy may be from new onset atrial flutter RVR related tachycardia versus stress induced cardiomyopathy from bacteremia and septic shock. - per cards (signed off 11/30): BB, Hydralazine/Imdur, Torsemide (outpatient diuretic was lasix 40 mg PO daily) -held demadex on 12/1 as he was NPO and BP on lower side- resume 12/3 after AM labs  New onset atrial flutter: Maintained NSR since DCCV 11/22 - Continue metoprolol succinate 50 Mg daily. - Continue amiodarone 200mg  twice daily x7 days and then 200 Mg daily. - spoke with cards: Ideally, patients remain on anticoagulation ~ 30 days after DCCV in order to decrease stroke risk.  Given is need for significant surgery and procedures and that he would have significant ricks if these were not down, the risk vs benefits would favor heparin and stopping as need per surgery and proceduralists for these infection control procedures.  He has been controled in rhythm with amiodarone loading and maintenance. - DOAC-> Heparin.   Hypomagnesemia: Improved with supplementation.  - replete   Leukocytosis: Continues improving.    T2DM uncontrolled with hyperglycemia:  - Increased Levemir, continue NovoLog 3 times daily with meals and SSI.  Further titrate insulins as needed.  Thrombocytopenia - Resolved   Generalized  deconditioning - PT recommends CIR which will be pursued once work up has been completed.    Acute on chronic pain: Follows with Dr. Nelva Bush, pain management.  Has received steroid injections to the back.  - Reviewed [place] PDMP and patient regularly fills opioid prescriptions. Likely opioid-dependent. - Obviously has reasons for acute pain from recent surgery, discitis/osteomyelitis. - Evaluating etiology for worsening back pain as noted above. Patient on following regimen after making changes: - Tylenol 1 g 3 times daily. - Oxy IR 10 mg every 4 hourly as needed for moderate pain - OxyContin 15 Mg every 12 hours - Lidocaine patch increased to 2 patches - Dilaudid 0.5-1 mg IV every 4 hourly as needed severe pain - Gabapentin 300 mg 3 times daily   Insomnia - Started trazodone 50 Mg at bedtime as needed   RN Pressure Injury Documentation: Pressure Injury 07/27/21 Sacrum Mid Stage 2 -  Partial thickness loss of dermis presenting as a shallow open injury with a red, pink wound bed without slough. small pink pressure ulcer with break in skin (Active)  07/27/21 0255  Location: Sacrum  Location Orientation: Mid  Staging: Stage 2 -  Partial thickness loss of dermis presenting as a shallow open injury with a red, pink wound bed without slough.  Wound Description (Comments): small pink pressure ulcer with break in skin  Present on Admission: Yes  -placed foley for now to aid with healing   Penile ulcers/wounds -r/o herpes -r/o G/C  Severe protein calorie malnutrition  Nutrition Status: Nutrition Problem: Increased nutrient needs Etiology: wound healing Signs/Symptoms: estimated needs Interventions: Glucerna shake, Premier Protein, Juven, MVI    DVT prophylaxis: heparin Code Status: Full Disposition Plan:  Status is: Inpatient  Remains inpatient appropriate because: sick/needs NS intervention  Consultants:  ID Neurosurgery PCCM Cardiology  Procedures:  Left TTA  07/23/2021 TEE/DCCV 07/29/2021    Subjective: Calm this AM-- IR reported not able to lay flat for procedure  Objective: Vitals:   08/07/21 1520 08/07/21 2010 08/08/21 0500 08/08/21 1318  BP: 97/62 (!) 114/56 132/64 125/70  Pulse: 67 69 (!) 57   Resp: 14 17  15   Temp: 97.8 F (36.6 C) 99.1 F (37.3 C) 98.6 F (37 C)   TempSrc: Oral Oral Oral   SpO2: 96% 95% 92%   Weight:      Height:        Intake/Output Summary (Last 24 hours) at 08/08/2021 1517 Last data filed at 08/08/2021 1449 Gross per 24 hour  Intake 408.83 ml  Output 4001 ml  Net -3592.17 ml   Filed Weights   07/27/21 0252 07/29/21 0754 08/01/21 0459  Weight: 108.2 kg 108.2 kg 107 kg     General: Appearance:     Overweight male in no acute distress     Lungs:     respirations unlabored  Heart:    Bradycardic.   MS:   Below knee amputation of right lower extremity is noted. Left below knee amputation noted.   Neurologic:   Awake, alert, oriented x 3. No apparent focal neurological           defect.         Data Reviewed: I have personally reviewed following labs and imaging studies  CBC: Recent Labs  Lab 08/04/21 0221 08/06/21 0158 08/08/21 0204  WBC 11.9* 10.8* 7.9  NEUTROABS  --  9.9*  --   HGB 12.4* 12.4* 12.2*  HCT 38.8* 38.2* 37.8*  MCV 88.8 89.0 89.2  PLT 386 413* 573   Basic Metabolic Panel: Recent Labs  Lab 08/01/21 1634 08/02/21 0232 08/02/21 0232 08/03/21 0204 08/04/21 0221 08/05/21 0214 08/06/21 0158 08/07/21 0302 08/07/21 2233 08/08/21 0204  NA  --  127*   < > 129* 131* 129* 128* 133*  --  129*  K  --  3.9   < > 3.8 4.0 4.4 4.8 4.0  --  4.2  CL  --  84*   < > 89* 91* 90* 92* 95*  --  94*  CO2  --  35*   < > 34* 34* 30 28 31   --  27  GLUCOSE  --  243*   < > 262* 267* 208* 378* 196* 373* 288*  BUN  --  38*   < > 43* 37* 35* 43* 35*  --  37*  CREATININE  --  1.77*   < > 1.81* 1.72* 1.53* 1.72* 1.52*  --  1.54*  CALCIUM  --  8.3*   < > 8.2* 8.4* 8.3* 8.1* 8.4*  --  7.8*  MG  1.9 1.7  --  1.8  --   --   --   --   --  1.6*   < > = values in this interval not displayed.   GFR: Estimated Creatinine Clearance: 71.7 mL/min (A) (by C-G formula based on SCr of 1.54 mg/dL (H)). Liver Function Tests: Recent Labs  Lab 08/02/21 0232  AST 33  ALT 33  ALKPHOS 77  BILITOT 0.7  PROT 5.4*  ALBUMIN <1.5*   No results for input(s): LIPASE, AMYLASE in the last 168 hours. No results for input(s): AMMONIA in the last 168 hours. Coagulation Profile: No results for input(s): INR, PROTIME in the last 168 hours. Cardiac Enzymes: No results for input(s): CKTOTAL, CKMB, CKMBINDEX, TROPONINI in the last 168 hours. BNP (last 3 results) No results for input(s): PROBNP in the last 8760 hours. HbA1C: No results for input(s): HGBA1C in the last 72 hours. CBG: Recent Labs  Lab 08/07/21 1416 08/07/21 1554 08/07/21 2135 08/08/21 0732 08/08/21 1124  GLUCAP 131* 147* 442* 302* 244*   Lipid Profile: No results for input(s): CHOL, HDL, LDLCALC, TRIG, CHOLHDL, LDLDIRECT in the last 72 hours. Thyroid Function Tests: No results for input(s): TSH, T4TOTAL, FREET4, T3FREE, THYROIDAB in the last 72 hours. Anemia Panel: No results for input(s): VITAMINB12, FOLATE, FERRITIN, TIBC, IRON, RETICCTPCT in the last 72 hours. Urine analysis:    Component Value Date/Time   COLORURINE YELLOW 07/22/2021 0250   APPEARANCEUR CLOUDY (A) 07/22/2021 0250   LABSPEC 1.018 07/22/2021 0250   PHURINE 5.0 07/22/2021 0250   GLUCOSEU >=500 (A) 07/22/2021 0250   HGBUR SMALL (A) 07/22/2021 0250   BILIRUBINUR NEGATIVE 07/22/2021 0250   KETONESUR 5 (A) 07/22/2021 0250   PROTEINUR 30 (A) 07/22/2021 0250   UROBILINOGEN 0.2 09/13/2019 0906   NITRITE NEGATIVE 07/22/2021 0250   LEUKOCYTESUR TRACE (A) 07/22/2021 0250   No results found for this or any previous visit (from the past 240 hour(s)).    Radiology Studies: MR THORACIC SPINE W WO CONTRAST  Result Date: 08/07/2021 CLINICAL DATA:  Mid back pain.  Strep bacteremia. Suspected thoracic osteomyelitis. On antibiotics. EXAM: MRI THORACIC WITHOUT AND WITH CONTRAST TECHNIQUE: Multiplanar and multiecho pulse sequences of the thoracic spine were obtained without and with intravenous contrast. CONTRAST:  3mL GADAVIST GADOBUTROL 1 MMOL/ML IV SOLN COMPARISON:  MRI thoracic spine without contrast 07/23/2021 FINDINGS: Alignment:  Normal Vertebrae: Progressive bone marrow edema and enhancement throughout the T5, T6, T7, and T8 vertebral bodies. Progressive nonenhancing tissue centered at the T6-7 disc space extending in to T6 and T7 likely due to discitis and adjacent osteomyelitis. Bone marrow edema also extends into the posterior elements at T5 and T6 consistent with extension of osteomyelitis. No fracture identified. Cord: Significant progression of posterior epidural multilocular fluid collection which is compressing the cord and causing spinal stenosis. This is most compatible with epidural abscess which extends from T3 through T9. Moderate spinal stenosis T4 through T8. Paraspinal and other soft tissues: Moderately large loculated pleural effusions bilaterally which are centered at the level of discitis osteomyelitis. Possible reactive effusion or empyema. Paraspinous soft tissue thickening is also present around the vertebra T5 through T8 due to infection. Disc levels: Disc degeneration T5 through T10 with disc space narrowing. Posterior osteophyte at T5-6, T6-7, T7-8, T8-9. IMPRESSION: 1. Significant progression of discitis and osteomyelitis at T5 through T8. There is edema extending into the posterior elements. 2. Extensive posterior epidural abscess has progressed since the prior study. Abscess extends from T3 through T9 and is exerting mass-effect on the cord with moderate spinal stenosis T4 through T8 3. Interval development of moderately large bilateral loculated pleural effusions. Possible empyema versus reactive fluid. 4. These results were called by  telephone at the time of interpretation on 08/07/2021 at 3:12 pm to provider Eliseo Squires, who verbally acknowledged these results. Electronically Signed   By: Franchot Gallo M.D.   On: 08/07/2021 15:18   MR Lumbar Spine W Wo Contrast  Result  Date: 08/07/2021 CLINICAL DATA:  Low back pain.  Suspect infection EXAM: MRI LUMBAR SPINE WITHOUT AND WITH CONTRAST TECHNIQUE: Multiplanar and multiecho pulse sequences of the lumbar spine were obtained without and with intravenous contrast. CONTRAST:  97mL GADAVIST GADOBUTROL 1 MMOL/ML IV SOLN COMPARISON:  MRI lumbar spine 07/23/2021 FINDINGS: Segmentation:  Standard Alignment:  Normal Vertebrae: Negative for fracture or mass. Moderate discogenic edema in the bone marrow at L5-S1 with associated disc space narrowing. Small amount of fluid in the disc space. Minimal enhancement of the endplates. Findings most compatible with disc degeneration. Conus medullaris and cauda equina: Conus extends to the L1 level. Conus and cauda equina appear normal. Paraspinal and other soft tissues: Diffuse paraspinous muscle edema without abscess. Findings compatible with myositis. Small left renal cysts. Disc levels: L1-2: Mild disc bulging.  Negative for stenosis L2-3: Shallow central disc protrusion.  Negative for stenosis L3-4: Mild disc bulging.  Negative for stenosis L4-5: Shallow broad-based disc protrusion and mild facet degeneration. Negative for stenosis L5-S1. Advanced disc degeneration with disc space narrowing. Diffuse endplate spurring. Bone marrow edema most compatible with disc degeneration. Small central disc protrusion is unchanged. Mild facet degeneration. Moderate subarticular and foraminal stenosis bilaterally. IMPRESSION: 1. No evidence of discitis/osteomyelitis in the lumbar spine. There is progressive muscle edema in the paraspinous muscles bilaterally compatible with myositis. No abscess 2. Advanced disc degeneration at L5-S1 with discogenic bone marrow edema. Electronically  Signed   By: Franchot Gallo M.D.   On: 08/07/2021 14:51    Scheduled Meds:  acetaminophen  1,000 mg Oral TID   acyclovir  400 mg Oral TID   amiodarone  200 mg Oral Daily   vitamin C  1,000 mg Oral Daily   Chlorhexidine Gluconate Cloth  6 each Topical Daily   docusate sodium  100 mg Oral Daily   feeding supplement (GLUCERNA SHAKE)  237 mL Oral BID BM   gabapentin  300 mg Oral TID   hydrALAZINE  25 mg Oral Q8H   insulin aspart  0-15 Units Subcutaneous TID WC   insulin aspart  0-5 Units Subcutaneous QHS   insulin aspart  8 Units Subcutaneous TID WC   insulin detemir  28 Units Subcutaneous BID   isosorbide mononitrate  30 mg Oral Daily   lidocaine  2 patch Transdermal Q24H   metoprolol succinate  50 mg Oral Daily   multivitamin with minerals  1 tablet Oral Daily   nutrition supplement (JUVEN)  1 packet Oral BID BM   oxyCODONE  15 mg Oral Q12H   polyethylene glycol  17 g Oral Daily   Ensure Max Protein  11 oz Oral Daily   [START ON 08/09/2021] torsemide  20 mg Oral Daily   Continuous Infusions:  sodium chloride 10 mL/hr at 07/29/21 0816   ceFEPime (MAXIPIME) IV 2 g (08/08/21 1117)   heparin 2,200 Units/hr (08/08/21 0726)   vancomycin 750 mg (08/08/21 1310)     LOS: 17 days   Time spent: 55 minutes.  Geradine Girt, DO [facility] [email or web address] 08/08/2021, 3:17 PM

## 2021-08-08 NOTE — Progress Notes (Signed)
ANTICOAGULATION CONSULT NOTE  Pharmacy Consult:  Heparin Indication: atrial fibrillation  Allergies  Allergen Reactions   Bactrim [Sulfamethoxazole-Trimethoprim]    Ceprotin [Protein C Concentrate (Human)]    Ciprofloxacin Other (See Comments)    Kidney function   Levaquin [Levofloxacin]     Patient Measurements: Height: 6\' 3"  (190.5 cm) Weight: 107 kg (235 lb 14.3 oz) IBW/kg (Calculated) : 84.5 Heparin Dosing Weight: 106 kg  Vital Signs: Temp: 98.6 F (37 C) (12/02 0500) Temp Source: Oral (12/02 0500) BP: 125/70 (12/02 1318) Pulse Rate: 57 (12/02 0500)  Labs: Recent Labs    08/06/21 0158 08/07/21 0302 08/08/21 0204 08/08/21 1256  HGB 12.4*  --  12.2*  --   HCT 38.2*  --  37.8*  --   PLT 413*  --  360  --   APTT  --   --  44* 52*  HEPARINUNFRC  --   --  0.67 0.43  CREATININE 1.72* 1.52* 1.54*  --      Estimated Creatinine Clearance: 71.7 mL/min (A) (by C-G formula based on SCr of 1.54 mg/dL (H)).  Assessment: 55 YOM history of on Eliquis, last dose 12/1 around 0830.  MRI showed loculated pleural effusions abscess with progression of discitis/osteomyelitis.  Pharmacy consulted to transition patient to IV heparin due to need for surgery/procedures.  CBC stable; no bleeding reported.  Patient was previously sub-therapeutic on heparin at 1700 units/hr.  Heparin level came back therapeutic at 0.43 however aPTT came back subtherapeutic at 52, on 2200 units/hr. No infusion issues. Having bleeding at sacral wound - will continue to monitor.  Goal of Therapy:  Heparin level 0.3-0.7 units/mL aPTT 66-102 seconds Monitor platelets by anticoagulation protocol: Yes   Plan:  Increase heparin to 2400 units/hr Order heparin level in 6 hours Monitor daily HL, CBC and for s/sx of bleeding   Antonietta Jewel, PharmD, [place] Pharmacist  Phone: [phone] 08/08/2021 2:43 PM  Please check AMION for all [place] phone numbers After 10:00 PM, call [place]  [phone]

## 2021-08-08 NOTE — Consult Note (Addendum)
[facility] Nurse Consult Note: Patient receiving care in [facility] Reason for Consult: Sacral wound Wound type: Evolving MASD/IAD now a DTPI on the coccyx/sacrum. Patient has epidural abscess to T4-T8 with discitis osteomyelitis which is very painful and sometimes refuses to turn because of the pain associated with this. This patient is also a double BKA with the left BKA recently performed by Dr. Sharol Given. Prevena vac present.  Pressure Injury POA: No Measurement: Unable to measure  Wound bed: Pink/purple/maroon  Drainage (amount, consistency, odor) None Periwound: Pink  Dressing procedure/placement/frequency: Clean the sacral area with soap and water, rinse and pat dry. Apply Desitin to the pink areas under the sacral foam dressing and to the associated areas on the penis.  Turn patient every 2 hours to get pressure off of the coccyx area. If patient refuses to turn then document that he refused.   Monitor the wound area(s) for worsening of condition such as: Signs/symptoms of infection, increase in size, development of or worsening of odor, development of pain, or increased pain at the affected locations.   Notify the medical team if any of these develop.  Thank you for the consult. [place] nurse will follow weekly.  Please re-consult the [place] team if needed.  Cathlean Marseilles Tamala Julian, MSN, RN, [place], [place], [facility] Wound Treatment Associate Pager [phone]

## 2021-08-08 NOTE — Progress Notes (Signed)
ANTICOAGULATION CONSULT NOTE  Pharmacy Consult:  Heparin Indication: atrial fibrillation  Allergies  Allergen Reactions   Bactrim [Sulfamethoxazole-Trimethoprim]    Ceprotin [Protein C Concentrate (Human)]    Ciprofloxacin Other (See Comments)    Kidney function   Levaquin [Levofloxacin]     Patient Measurements: Height: 6\' 3"  (190.5 cm) Weight: 107 kg (235 lb 14.3 oz) IBW/kg (Calculated) : 84.5 Heparin Dosing Weight: 106 kg  Vital Signs: Temp: 99.1 F (37.3 C) (12/01 2010) Temp Source: Oral (12/01 2010) BP: 114/56 (12/01 2010) Pulse Rate: 69 (12/01 2010)  Labs: Recent Labs    08/06/21 0158 08/07/21 0302 08/08/21 0204  HGB 12.4*  --  12.2*  HCT 38.2*  --  37.8*  PLT 413*  --  360  APTT  --   --  44*  HEPARINUNFRC  --   --  0.67  CREATININE 1.72* 1.52* 1.54*     Estimated Creatinine Clearance: 71.7 mL/min (A) (by C-G formula based on SCr of 1.54 mg/dL (H)).  Assessment: 55 YOM history of on Eliquis, last dose 12/1 around 0830.  MRI showed loculated pleural effusions abscess with progression of discitis/osteomyelitis.  Pharmacy consulted to transition patient to IV heparin due to need for surgery/procedures.  CBC stable; no bleeding reported.  Patient was previously sub-therapeutic on heparin at 1700 units/hr.  12/2 AM update:  aPTT is low  Goal of Therapy:  Heparin level 0.3-0.7 units/mL aPTT 66-102 seconds Monitor platelets by anticoagulation protocol: Yes   Plan:  Inc heparin to 2200 units/hr 1300 aPTT and heparin level  Narda Bonds, PharmD, BCPS Clinical Pharmacist Phone: [phone]

## 2021-08-08 NOTE — Progress Notes (Addendum)
[facility]ious Disease  Date of Admission:  07/21/2021   Total days of inpatient antibiotics 15  Principal Problem:   Discitis of thoracic region Active Problems:   Essential hypertension   Stage 3a chronic kidney disease (HCC)   Type 2 diabetes mellitus with diabetic polyneuropathy, with long-term current use of insulin (HCC)   Right below-knee amputee (HCC)   Diabetic infection of left foot (HCC)   Chronic diastolic (congestive) heart failure (HCC)   Severe sepsis with acute organ dysfunction (HCC)   Chest pain   AKI (acute kidney injury) ([place])   Gas gangrene of foot (HCC)   Atrial flutter (HCC)   Septic shock (HCC)   Systolic dysfunction   Pressure injury of skin   MSSA bacteremia          Assessment: 55 year old male with poorly controlled diabetes, A1c 13.8, status post right BKA admitted for diabetic foot ulcer osteomyelitis now status post left BKA.  Infectious disease consulted at blood cultures grew group B strep.  MRI w/o contrast ordered which showed possible T6-T8 bone marrow edema consistent with possible discitis/osteomyelitis.    #GBS bacteremia SP left BKA #Worsening Thoracic Osteomyelitis(OM) with extensive epidural abscess 2/2 GBS -OM seen on MRI cervical lumbar and thoracic without contrast due to renal injury. Pt continued to have back pain. NSY consulted and no plans for surgery on 07/27/21. -Back pain seemed to be improving. TEE did not show vegetation. ID signed off with treatment plan for bacteremia and vertebral OM. Pt was continued on penicillin IV with plan to transition to PO amoxicillin to complete 8 weeks of treatment. -Pain abruptly worsened while on Penicillin  and ID re-engaged. Obtained MRI with contrast due to persistent and now worsening back pain. MRI w/ contrast showed  progression of discitis/OM T5-T8 with extensive epidural abscess that has progressed since prior study now from T3-T9 exerting mass-effect on cord with  moderate spinal stenosis T4-T8. Interval development of moderately large bilateral loculated pleural effusions: possible empyema vs reactive fluid. Recommendations: -Suspect worsening OM is 2/2 lack of source control. Per neurosurgery note abscess was probably there 2 weeks ago(described as epidural fat on prior imaging). Progression of abscess noted on MRI read. Antibiotics alone will unlikely be curative with imaging showing worsening osteomyelitis and an extensive epidural abscess on appropriate antibiotics. Currently, no plans for intervention.  -I attempted to discuss pt's care today. He reported he should have been on vancomycin "the whole time", and I was playing "russian roulette". I explained that his antibiotic coverage has been broadened because of the loculated lung effusions and that we still suspect GBS as the culprit for his back. He stated  " you have me on penicillin that's for gonorrhea." Then he asked me to "get out, I don't want to see you anymore" several times. This interaction occurred in presence of the nurse Arna Snipe and [place].    #Loculated pleural effusions concerning for empyema -Broaden coverage to vancomycin and cefepime to cover for possible hospital acquired pneumonia -Suspect this is all 2/2 to GBS Recommendations -Continue vancomycin and cefepime for possible hospital acquired PNA.  -Obtain CT chest. If imaging is not concerning for pneumonia the appropriate treatment is targeted therapy with penicillin.  -Obtain sputum Cultures.     Microbiology:   Antibiotics: Cefazolin 11/16 Ceftriaxone 11/14, 11/16-17 Clindamycin 11/15-16 Linezolid 11/15 Pen G 11/18-p Pip-tazo 11/15-11/16 Vancomycin 11/14,12/1 Cefepime 12/1-p  Cultures: Blood Cx: 11/14 2/2 GBS  Group b strep(s.agalactiae)isolated    MIC    AMPICILLIN <=0.25 SENS... Sensitive    CEFTRIAXONE <=0.12 SENS... Sensitive    CLINDAMYCIN <=0.25 SENS... Sensitive    ERYTHROMYCIN  <=0.12 SENS... Sensitive    LEVOFLOXACIN 0.5 SENSITIVE  Sensitive    VANCOMYCIN 0.5 SENSITIVE  Sensitive    11/16 NGTD  11/14 Urine Cx GBS SUBJECTIVE: Resting in bed. Pt is extremely agitated and asked me to leave prior to being able to do a physical exam. Exchange with patient is noted in the assessment and plan.    Interval: Afebrile, wbc 11.9K. Review of Systems: Review of Systems  All other systems reviewed and are negative.   Scheduled Meds:  acetaminophen  1,000 mg Oral TID   amiodarone  200 mg Oral Daily   vitamin C  1,000 mg Oral Daily   Chlorhexidine Gluconate Cloth  6 each Topical Daily   docusate sodium  100 mg Oral Daily   feeding supplement (GLUCERNA SHAKE)  237 mL Oral TID BM   gabapentin  300 mg Oral TID   hydrALAZINE  25 mg Oral Q8H    HYDROmorphone (DILAUDID) injection  1 mg Intravenous Once   insulin aspart  0-15 Units Subcutaneous TID WC   insulin aspart  0-5 Units Subcutaneous QHS   insulin aspart  8 Units Subcutaneous TID WC   insulin detemir  28 Units Subcutaneous BID   isosorbide mononitrate  30 mg Oral Daily   lidocaine  2 patch Transdermal Q24H   metoprolol succinate  50 mg Oral Daily   multivitamin with minerals  1 tablet Oral Daily   nutrition supplement (JUVEN)  1 packet Oral BID BM   oxyCODONE  15 mg Oral Q12H   polyethylene glycol  17 g Oral Daily   Ensure Max Protein  11 oz Oral Daily   Continuous Infusions:  sodium chloride 10 mL/hr at 07/29/21 0816   ceFEPime (MAXIPIME) IV 2 g (08/08/21 0040)   heparin 2,200 Units/hr (08/08/21 0726)   vancomycin 750 mg (08/08/21 0141)   PRN Meds:.alum & mag hydroxide-simeth, bisacodyl, diphenhydrAMINE, guaiFENesin-dextromethorphan, hydrALAZINE, HYDROmorphone (DILAUDID) injection, ondansetron, oxyCODONE, phenol, polyethylene glycol, traZODone Allergies  Allergen Reactions   Bactrim [Sulfamethoxazole-Trimethoprim]    Ceprotin [Protein C Concentrate (Human)]    Ciprofloxacin Other (See Comments)     Kidney function   Levaquin [Levofloxacin]     OBJECTIVE: Vitals:   08/07/21 1450 08/07/21 1520 08/07/21 2010 08/08/21 0500  BP: (!) 99/58 97/62 (!) 114/56 132/64  Pulse: 74 67 69 (!) 57  Resp: 12 14 17    Temp: 98 F (36.7 C) 97.8 F (36.6 C) 99.1 F (37.3 C) 98.6 F (37 C)  TempSrc:  Oral Oral Oral  SpO2: 93% 96% 95% 92%  Weight:      Height:       Body mass index is 29.48 kg/m.    Lab Results Lab Results  Component Value Date   WBC 7.9 08/08/2021   HGB 12.2 (L) 08/08/2021   HCT 37.8 (L) 08/08/2021   MCV 89.2 08/08/2021   PLT 360 08/08/2021    Lab Results  Component Value Date   CREATININE 1.54 (H) 08/08/2021   BUN 37 (H) 08/08/2021   NA 129 (L) 08/08/2021   K 4.2 08/08/2021   CL 94 (L) 08/08/2021   CO2 27 08/08/2021    Lab Results  Component Value Date   ALT 33 08/02/2021   AST 33 08/02/2021   ALKPHOS 77 08/02/2021   BILITOT 0.7 08/02/2021  Laurice Record, MD [facility] 08/08/2021, 11:10 AM

## 2021-08-08 NOTE — Consult Note (Signed)
[facility] Nurse Consult Note: Patient receiving care in [facility] 6E26 Attempted to see patient again. Dr. Eliseo Squires in the room discussing a procedure with the patient and he is very upset at this time. Bedside RN Margreta Journey will page me or [place] me when they decide to bathe him and I will assess his sacral wound at that time.  Cathlean Marseilles Tamala Julian, MSN, RN, [place], [place] [place], [facility] Wound Treatment Associate Pager [phone]

## 2021-08-08 NOTE — Progress Notes (Signed)
PT Cancellation Note  Patient Details Name: [name] MRN: [id number] DOB: October 06, 1965   Cancelled Treatment:    Reason Eval/Treat Not Completed: Pain limiting ability to participate. Pt politely declining any bed mobility, OOB mobility, or therapeutic exercise with PT this date due to back pain. Pt reporting he is doing his lower extremity HEP, reviewed with pt along with proper positioning of BKA. Pt requesting PT to follow-up over the weekend. Will plan to follow-up another day as able.  Moishe Spice, PT, DPT Acute Rehabilitation Services  Pager: [phone] Office: [place] 08/08/2021, 4:59 PM

## 2021-08-09 DIAGNOSIS — M4644 Discitis, unspecified, thoracic region: Secondary | ICD-10-CM | POA: Diagnosis not present

## 2021-08-09 LAB — CBC
HCT: 39.2 % (ref 39.0–52.0)
Hemoglobin: 12.3 g/dL — ABNORMAL LOW (ref 13.0–17.0)
MCH: 28.3 pg (ref 26.0–34.0)
MCHC: 31.4 g/dL (ref 30.0–36.0)
MCV: 90.3 fL (ref 80.0–100.0)
Platelets: 328 10*3/uL (ref 150–400)
RBC: 4.34 MIL/uL (ref 4.22–5.81)
RDW: 14.4 % (ref 11.5–15.5)
WBC: 7.4 10*3/uL (ref 4.0–10.5)
nRBC: 0 % (ref 0.0–0.2)

## 2021-08-09 LAB — APTT: aPTT: 52 seconds — ABNORMAL HIGH (ref 24–36)

## 2021-08-09 LAB — GLUCOSE, CAPILLARY
Glucose-Capillary: 104 mg/dL — ABNORMAL HIGH (ref 70–99)
Glucose-Capillary: 197 mg/dL — ABNORMAL HIGH (ref 70–99)
Glucose-Capillary: 196 mg/dL — ABNORMAL HIGH (ref 70–99)
Glucose-Capillary: 169 mg/dL — ABNORMAL HIGH (ref 70–99)

## 2021-08-09 LAB — BASIC METABOLIC PANEL
Anion gap: 7 (ref 5–15)
BUN: 27 mg/dL — ABNORMAL HIGH (ref 6–20)
CO2: 26 mmol/L (ref 22–32)
Calcium: 7.9 mg/dL — ABNORMAL LOW (ref 8.9–10.3)
Chloride: 100 mmol/L (ref 98–111)
Creatinine, Ser: 1.21 mg/dL (ref 0.61–1.24)
GFR, Estimated: >60 mL/min (ref >60)
Glucose, Bld: 216 mg/dL — ABNORMAL HIGH (ref 70–99)
Potassium: 3.8 mmol/L (ref 3.5–5.1)
Sodium: 133 mmol/L — ABNORMAL LOW (ref 135–145)

## 2021-08-09 LAB — MAGNESIUM: Magnesium: 1.4 mg/dL — ABNORMAL LOW (ref 1.7–2.4)

## 2021-08-09 LAB — VANCOMYCIN, PEAK: Vancomycin Pk: 24 ug/mL — ABNORMAL LOW (ref 30–40)

## 2021-08-09 LAB — HEPARIN LEVEL (UNFRACTIONATED): Heparin Unfractionated: 0.42 IU/mL (ref 0.30–0.70)

## 2021-08-09 LAB — VANCOMYCIN, TROUGH: Vancomycin Tr: 12 ug/mL — ABNORMAL LOW (ref 15–20)

## 2021-08-09 MED ORDER — MAGNESIUM SULFATE 4 GM/100ML IV SOLN
4.0000 g | Freq: Once | INTRAVENOUS | Status: AC
  Administered 2021-08-09: 4 g via INTRAVENOUS
  Filled 2021-08-09: qty 100

## 2021-08-09 MED ORDER — ENSURE MAX PROTEIN PO LIQD
11.0000 [oz_av] | Freq: Two times a day (BID) | ORAL | Status: DC
  Administered 2021-08-09 – 2021-09-09 (×54): 11 [oz_av] via ORAL
  Filled 2021-08-09 (×67): qty 330

## 2021-08-09 MED ORDER — VANCOMYCIN HCL 750 MG/150ML IV SOLN
750.0000 mg | Freq: Three times a day (TID) | INTRAVENOUS | Status: DC
  Administered 2021-08-09 – 2021-08-21 (×34): 750 mg via INTRAVENOUS
  Filled 2021-08-09 (×38): qty 150

## 2021-08-09 MED ORDER — ENOXAPARIN SODIUM 100 MG/ML IJ SOSY
100.0000 mg | PREFILLED_SYRINGE | Freq: Two times a day (BID) | INTRAMUSCULAR | Status: AC
  Administered 2021-08-09 – 2021-08-10 (×4): 100 mg via SUBCUTANEOUS
  Filled 2021-08-09 (×4): qty 1

## 2021-08-09 MED ORDER — INSULIN ASPART 100 UNIT/ML IJ SOLN
4.0000 [IU] | Freq: Three times a day (TID) | INTRAMUSCULAR | Status: DC
  Administered 2021-08-09 – 2021-08-11 (×7): 4 [IU] via SUBCUTANEOUS

## 2021-08-09 NOTE — Progress Notes (Addendum)
ANTICOAGULATION CONSULT NOTE  Pharmacy Consult:  Heparin>>Lovenox Indication: atrial fibrillation  Allergies  Allergen Reactions   Bactrim [Sulfamethoxazole-Trimethoprim]    Ceprotin [Protein C Concentrate (Human)]    Ciprofloxacin Other (See Comments)    Kidney function   Levaquin [Levofloxacin]     Patient Measurements: Height: 6\' 3"  (190.5 cm) Weight: 107 kg (235 lb 14.3 oz) IBW/kg (Calculated) : 84.5 Heparin Dosing Weight: 106 kg  Vital Signs: Temp: 98.6 F (37 C) (12/03 0525) Temp Source: Oral (12/03 0525) BP: 134/84 (12/03 0525)  Labs: Recent Labs    08/07/21 0302 08/08/21 0204 08/08/21 0204 08/08/21 1256 08/08/21 2158 08/09/21 0250 08/09/21 0943  HGB  --  12.2*  --   --   --  12.3*  --   HCT  --  37.8*  --   --   --  39.2  --   PLT  --  360  --   --   --  328  --   APTT  --  44*   < > 52* 57*  --  52*  HEPARINUNFRC  --  0.67  --  0.43  --   --  0.42  CREATININE 1.52* 1.54*  --   --   --  1.21  --    < > = values in this interval not displayed.     Estimated Creatinine Clearance: 91.2 mL/min (by C-G formula based on SCr of 1.21 mg/dL).  Assessment: 55 YOM history of on Eliquis, last dose 12/1 around 0830.  MRI showed loculated pleural effusions abscess with progression of discitis/osteomyelitis.  Pharmacy consulted to transition patient to IV heparin due to need for surgery/procedures.  CBC stable; no bleeding reported.  Patient was previously sub-therapeutic on heparin at 1700 units/hr.  PTT continues to be subtherapeutic despite high rate. D/w Dr. Eliseo Squires, we will change heparin to Lovenox bridging for now. We will transition back if surgery is planned.   Goal of Therapy:  Anti-Xa 0.6-1 Heparin level 0.3-0.7 units/mL aPTT 66-102 seconds Monitor platelets by anticoagulation protocol: Yes   Plan:  Dc heparin Lovenox 100mg  SQ BID F/u with surgery plan  Onnie Boer, PharmD, BCIDP, AAHIVP, CPP Infectious Disease Pharmacist 08/09/2021 11:07 AM

## 2021-08-09 NOTE — Progress Notes (Signed)
Physical Therapy Treatment Patient Details Name: [name] MRN: [id number] DOB: 08/26/1966 Today's Date: 08/09/2021   History of Present Illness 55 yo admitted 11/14 with infected left foot s/p Left BKA 11/16. Pt with T6-8 discitis. PMhx: Rt BKA, Aflutter, DM, depression and anxiety    PT Comments    Pt received in supine, agreeable to therapy session and with good participation and fair tolerance for bed mobility and instruction on importance of frequent repositioning for skin protection. Pt receptive to all instruction, emphasis on supine LE HEP within pain tolerance (HEP printed and given to pt, link below), frequency of repositioning for pressure relief, pursed-lip breathing and relaxation techniques and importance of gradually raising/lowering HOB within tolerance to progress tolerance to flat/elevated postures and allow for increased pressure on spine with future attempts at bed mobility. Pt polite and receptive to instruction, perseverates on needing nerve block for lower spine as he had these "every few months" prior to this admission and it is "only way I will be able to sit up" at EOB. Pt needing up to [place] for rolling and unable to reach full upright position with log roll attempt and +2 physical assist to sit on L EOB. Pt continues to benefit from PT services to progress toward functional mobility goals.    Recommendations for follow up therapy are one component of a multi-disciplinary discharge planning process, led by the attending physician.  Recommendations may be updated based on patient status, additional functional criteria and insurance authorization.  Follow Up Recommendations  Acute inpatient rehab (3hours/day)     Assistance Recommended at Discharge Frequent or constant Supervision/Assistance  Equipment Recommendations  Wheelchair (measurements PT);Wheelchair cushion (measurements PT);Other (comment) (bilateral BKA wheelchair assessment, may need slide board and drop  arm BSC?)    Recommendations for Other Services       Precautions / Restrictions Precautions Precautions: Fall;Back (back precautions for comfort) Precaution Comments: Contact -MRSA; wound vac L LE; educated pt on BLT for comfort Required Braces or Orthoses: Other Brace Other Brace: LLE limb protector, R BK prosthesis Restrictions Weight Bearing Restrictions: Yes LLE Weight Bearing: Non weight bearing Other Position/Activity Restrictions: RLE prosthetic is not fitting currently per pt     Mobility  Bed Mobility Overal bed mobility: Needs Assistance Bed Mobility: Rolling Rolling: Min assist;+2 for safety/equipment;Mod assist Sidelying to sit: Max assist;+2 for physical assistance       General bed mobility comments: Pt rolling to L with less pain than to R today but able to roll to each side for placement/removal of pads. MinA to assist with bringing L leg across body to roll to R using bed rail. Cues provided for log roll technique to manage back pain. Total Assist +2 to pull pt superiorly in bed using bed pads, pt unable to tolerate flat bed or trendelenburg due to low back pain (only to 16 degrees at lowest).    Transfers   General transfer comment: declined; unable to reach edge of bed due to LBP       Balance       Sitting balance - Comments: pt unable to sit upright at edge of bed           Cognition Arousal/Alertness: Awake/alert Behavior During Therapy: [facility] for tasks assessed/performed;Anxious Overall Cognitive Status: Within Functional Limits for tasks assessed Area of Impairment: Safety/judgement;Attention;Problem solving    Current Attention Level: Sustained;Focused  Safety/Judgement: Decreased awareness of safety Awareness: Emergent Problem Solving: Decreased initiation;Requires verbal cues General Comments: Anxious with movement due to  pain, pt self-limiting due to pain and only tries bed mobility one time prior to giving up due to pain         Exercises General Exercises - Lower Extremity Quad Sets: AROM;Both;5 reps;Supine Gluteal Sets: AROM;Both;5 reps;Supine Hip ABduction/ADduction: AROM;Both;5 reps;Supine Straight Leg Raises: AROM;Both;AAROM;5 reps;Supine Other Exercises Other Exercises: HEP: [email or web address] Access Code: [email or web address]    General Comments General comments (skin integrity, edema, etc.): VSS on RA per chart review, tele WFL, pt diaphoretic but unable to sit up EOB to take orthostatic vitals      Pertinent Vitals/Pain Pain Assessment: 0-10 Pain Score: 9  Pain Location: lower back with attempt at log roll transfer to eob (pt reports 6/10 while resting in low back) Pain Descriptors / Indicators: Grimacing;Guarding;Moaning;Pressure;Other (Comment) (diaphoretic, flushed skin) Pain Intervention(s): Limited activity within patient's tolerance;Monitored during session;Premedicated before session;Repositioned;Utilized relaxation techniques     PT Goals (current goals can now be found in the care plan section) Acute Rehab PT Goals Patient Stated Goal: to get back to his normal PT Goal Formulation: With patient Time For Goal Achievement: 08/20/21 Progress towards PT goals: Not progressing toward goals - comment (severe pain limiting)    Frequency    Min 3X/week      PT Plan Current plan remains appropriate    AM-PAC PT "6 Clicks" Mobility   Outcome Measure  Help needed turning from your back to your side while in a flat bed without using bedrails?: A Lot Help needed moving from lying on your back to sitting on the side of a flat bed without using bedrails?: Total Help needed moving to and from a bed to a chair (including a wheelchair)?: Total Help needed standing up from a chair using your arms (e.g., wheelchair or bedside chair)?: Total Help needed to walk in hospital room?: Total Help needed climbing 3-5 steps with a railing? : Total 6 Click Score: 7    End of Session   Activity Tolerance:  Patient limited by pain Patient left: in bed;with call bell/phone within reach;with bed alarm set;Other (comment) (semi-sidelying toward R, pillows under L lower back and hip, pt requesting to elevated RLE due to edema, pt notified not to keep knee bent with pillow under knee) Nurse Communication: Mobility status;Patient requests pain meds;Other (comment) (pt requesting nerve block for lower back) PT Visit Diagnosis: Other abnormalities of gait and mobility (R26.89);Muscle weakness (generalized) (M62.81)     Time: 2536-6440 PT Time Calculation (min) (ACUTE ONLY): 34 min  Charges:  $Therapeutic Exercise: 8-22 mins $Therapeutic Activity: 8-22 mins                     Bartholomew Ramesh P., PTA Acute Rehabilitation Services Pager: [phone] Office: [place] 08/09/2021, 3:57 PM

## 2021-08-09 NOTE — Progress Notes (Signed)
ANTICOAGULATION CONSULT NOTE  Pharmacy Consult:  Heparin Indication: atrial fibrillation  Allergies  Allergen Reactions   Bactrim [Sulfamethoxazole-Trimethoprim]    Ceprotin [Protein C Concentrate (Human)]    Ciprofloxacin Other (See Comments)    Kidney function   Levaquin [Levofloxacin]     Patient Measurements: Height: 6\' 3"  (190.5 cm) Weight: 107 kg (235 lb 14.3 oz) IBW/kg (Calculated) : 84.5 Heparin Dosing Weight: 106 kg  Vital Signs: Temp: 98.7 F (37.1 C) (12/02 2228) Temp Source: Oral (12/02 2228) BP: 151/86 (12/02 2228) Pulse Rate: 66 (12/02 2228)  Labs: Recent Labs    08/06/21 0158 08/07/21 0302 08/08/21 0204 08/08/21 1256 08/08/21 2158  HGB 12.4*  --  12.2*  --   --   HCT 38.2*  --  37.8*  --   --   PLT 413*  --  360  --   --   APTT  --   --  44* 52* 57*  HEPARINUNFRC  --   --  0.67 0.43  --   CREATININE 1.72* 1.52* 1.54*  --   --      Estimated Creatinine Clearance: 71.7 mL/min (A) (by C-G formula based on SCr of 1.54 mg/dL (H)).  Assessment: 55 YOM history of on Eliquis, last dose 12/1 around 0830.  MRI showed loculated pleural effusions abscess with progression of discitis/osteomyelitis.  Pharmacy consulted to transition patient to IV heparin due to need for surgery/procedures.  CBC stable; no bleeding reported.  Patient was previously sub-therapeutic on heparin at 1700 units/hr.  12/3 AM update:  aPTT low Some oozing at sacral wound  Goal of Therapy:  Heparin level 0.3-0.7 units/mL aPTT 66-102 seconds Monitor platelets by anticoagulation protocol: Yes   Plan:  Inc heparin to 2600 units/hr 0900 aPTT and heparin level Watch sacral oozing-f/u AM Hgb  Narda Bonds, PharmD, BCPS Clinical Pharmacist Phone: [phone]

## 2021-08-09 NOTE — Progress Notes (Signed)
PROGRESS NOTE  [name]  JEH:[id number] DOB: [birth year] DOA: 07/21/2021 PCP: Patient, No Pcp Per (Inactive)   Brief Narrative: 55 year old male with history of diabetes mellitus type 2, diabetic foot infection with prior right BKA, chronic diastolic CHF, hypertension, dyslipidemia presented with worsening left foot swelling/discoloration and discharge.  On presentation, he was hypotensive, tachycardic with A. fib with RVR with CT suggestive of first MTP osteomyelitis and soft tissue gas concerning for possible necrotizing fasciitis along with AKI with creatinine of 2.7 and mild DKA.  His blood pressures did not improve with IV fluids and he was transferred to ICU and PCCM was consulted.  Orthopedics was consulted.  He underwent left transtibial amputation on 07/23/2021.  He was found to have strep agalactiae bacteremia.  ID was consulted.  He was found to have vertebral discitis versus osteomyelitis on MRI.  He was also found to have new systolic dysfunction with LVEF of 35% along with new onset atrial flutter requiring IV amiodarone which has been switched to oral.  Cardiology was consulted.  He has been started on Eliquis.  S/p TEE followed by DCCV on 11/22.  Developed worsening back pain 11/26-11/27, ID reconsulted, requested MRI T/L-spine with contrast. This requires general anesthesia and was performed 11/29. Unfortunately diagnostic images could not be obtained.  Repeat MRI done 12/1 and shows:  1. Significant progression of discitis and osteomyelitis at T5 through T8. There is edema extending into the posterior elements. 2. Extensive posterior epidural abscess has progressed since the prior study. Abscess extends from T3 through T9 and is exerting mass-effect on the cord with moderate spinal stenosis T4 through T8 3. Interval development of moderately large bilateral loculated pleural effusions. Possible empyema versus reactive fluid. Attempted thoracentesis on 12/2 but patient not able to  lay in a position to get procedure done-- IR said will need to be done under anesthesia.  Will get CT scan of chest today as patient thinks he can lay flat   Assessment & Plan: Principal Problem:   Discitis of thoracic region Active Problems:   Essential hypertension   Stage 3a chronic kidney disease (HCC)   Type 2 diabetes mellitus with diabetic polyneuropathy, with long-term current use of insulin (HCC)   Right below-knee amputee ([place])   Diabetic infection of left foot (HCC)   Chronic diastolic (congestive) heart failure (HCC)   Severe sepsis with acute organ dysfunction (HCC)   Chest pain   AKI (acute kidney injury) ([place])   Gas gangrene of foot (HCC)   Atrial flutter (HCC)   Septic shock (HCC)   Systolic dysfunction   Pressure injury of skin   MSSA bacteremia   Septic shock: Evolving on admission from bacteremia/diabetic foot infection -Off pressors.  Shock resolved. -Required care in ICU under PCCM service: Care has been transferred to [facility] on 07/27/2021   Diabetic foot infection, left: Status post left transtibial amputation on 07/23/2021. - Wound/wound VAC care as per Dr. Sharol Given.    Group B streptococcus bacteremia, worsening thoracic discitis/osteomyelitis.  -No vegetation on TTE or TEE during cardioversion.   -MRI 12/1: 1. Significant progression of discitis and osteomyelitis at T5 through T8. There is edema extending into the posterior elements. 2. Extensive posterior epidural abscess has progressed since the prior study. Abscess extends from T3 through T9 and is exerting mass-effect on the cord with moderate spinal stenosis T4 through T8 3. Interval development of moderately large bilateral loculated pleural effusions. Possible empyema versus reactive fluid. - Neurosurgery re-consulted on 12/1- spoke with Dr.  Ronnald Ramp- surgery would have to be delayed until next week due to elquis dose on 12/1 -patient moving both legs -ID notified of MRI findings and abx changed on  12/1: vancomycin and cefepime -blood cultures from 07/24/2021 were negative -will place PICC line   AKI on stage IIIa CKD: AKI resolving with limitation on diuresis. Had coronary CTA done earlier this year, no implanted devices. Discussed risk of NSF to which the patient confirms understanding and gives consent to proceed with contrasted MRI.  -Cr stable -daily labs  Pt takes testosterone (not prescription):  - total is low -free pending -can follow up outpatient   Hyponatremia:  -stable  Pleural effusion/?loculation -IR consult for thoracentesis/chest tube- was not able to do on 12/2 as he was not able to lay flat -culture with procedure -CT scan of lungs 12/3   New onset systolic CHF: Echo showed EF of 35% (previous echo showed EF of 50 to 55% in 04/2021). Cardiomyopathy may be from new onset atrial flutter RVR related tachycardia versus stress induced cardiomyopathy from bacteremia and septic shock. - per cards (signed off 11/30): BB, Hydralazine/Imdur, Torsemide (outpatient diuretic was lasix 40 mg PO daily) -held demadex on 12/1 as he was NPO and BP on lower side- resume 12/4 after AM labs  New onset atrial flutter: Maintained NSR since DCCV 11/22 - Continue metoprolol succinate 50 Mg daily. - Continue amiodarone 200mg  twice daily x7 days and then 200 Mg daily. - spoke with cards: Ideally, patients remain on anticoagulation ~ 30 days after DCCV in order to decrease stroke risk.  Given is need for significant surgery and procedures and that he would have significant ricks if these were not down, the risk vs benefits would favor heparin and stopping as need per surgery and proceduralists for these infection control procedures.  He has been controled in rhythm with amiodarone loading and maintenance. - DOAC-> lovenox (held heparin as having difficulty getting therapeutic)   Hypomagnesemia: Improved with supplementation.  - replete   Leukocytosis: Continues improving.    T2DM  uncontrolled with hyperglycemia:  - Increased Levemir, continue NovoLog 3 times daily with meals and SSI.  Further titrate insulins as needed.  Thrombocytopenia - Resolved   Generalized deconditioning - PT recommends CIR which will be pursued once work up has been completed.    Acute on chronic pain: Follows with Dr. Nelva Bush, pain management.  Has received steroid injections to the back.  - Reviewed [place] PDMP and patient regularly fills opioid prescriptions. Likely opioid-dependent. - Obviously has reasons for acute pain from recent surgery, discitis/osteomyelitis. - Evaluating etiology for worsening back pain as noted above. Patient on following regimen after making changes: - Tylenol 1 g 3 times daily. - Oxy IR 10 mg every 4 hourly as needed for moderate pain - OxyContin 15 Mg every 12 hours - Lidocaine patch increased to 2 patches - Dilaudid 0.5-1 mg IV every 4 hourly as needed severe pain - Gabapentin 300 mg 3 times daily   Insomnia - Started trazodone 50 Mg at bedtime as needed   RN Pressure Injury Documentation: Pressure Injury 07/27/21 Sacrum Mid Stage 2 -  Partial thickness loss of dermis presenting as a shallow open injury with a red, pink wound bed without slough. small pink pressure ulcer with break in skin (Active)  07/27/21 0255  Location: Sacrum  Location Orientation: Mid  Staging: Stage 2 -  Partial thickness loss of dermis presenting as a shallow open injury with a red, pink wound bed without slough.  Wound Description (Comments): small pink pressure ulcer with break in skin  Present on Admission: Yes  -placed foley for now to aid with healing   Penile ulcers/wounds -r/o herpes- treat in meantime with acyclovir -r/o G/C pending -suspect could be moisture associated  Severe protein calorie malnutrition Nutrition Status: Nutrition Problem: Increased nutrient needs Etiology: wound healing Signs/Symptoms: estimated needs Interventions: Glucerna shake, Premier  Protein, Juven, MVI    DVT prophylaxis: heparin Code Status: Full Disposition Plan:  Status is: Inpatient  Remains inpatient appropriate because: sick/needs NS intervention  Consultants:  ID Neurosurgery PCCM Cardiology  Procedures:  Left TTA 07/23/2021 TEE/DCCV 07/29/2021    Subjective: Says his pain is better and he has been able to lay flatter in the bed  Objective: Vitals:   08/08/21 1318 08/08/21 1525 08/08/21 2228 08/09/21 0525  BP: 125/70 (!) 123/50 (!) 151/86 134/84  Pulse:  66 66   Resp: 15 18 15 16   Temp:  98.5 F (36.9 C) 98.7 F (37.1 C) 98.6 F (37 C)  TempSrc:  Oral Oral Oral  SpO2:   99% 98%  Weight:      Height:        Intake/Output Summary (Last 24 hours) at 08/09/2021 1350 Last data filed at 08/09/2021 9702 Gross per 24 hour  Intake 240 ml  Output 4801 ml  Net -4561 ml   Filed Weights   07/27/21 0252 07/29/21 0754 08/01/21 0459  Weight: 108.2 kg 108.2 kg 107 kg     General: Appearance:     Overweight male in no acute distress     Lungs:     diminished, respirations unlabored  Heart:    Normal heart rate.    MS:   Below knee amputation of right lower extremity is noted. Left below knee amputation noted.   Neurologic:   Awake, alert, oriented x 3. No apparent focal neurological           defect.           Data Reviewed: I have personally reviewed following labs and imaging studies  CBC: Recent Labs  Lab 08/04/21 0221 08/06/21 0158 08/08/21 0204 08/09/21 0250  WBC 11.9* 10.8* 7.9 7.4  NEUTROABS  --  9.9*  --   --   HGB 12.4* 12.4* 12.2* 12.3*  HCT 38.8* 38.2* 37.8* 39.2  MCV 88.8 89.0 89.2 90.3  PLT 386 413* 360 637   Basic Metabolic Panel: Recent Labs  Lab 08/03/21 0204 08/04/21 0221 08/05/21 0214 08/06/21 0158 08/07/21 0302 08/07/21 2233 08/08/21 0204 08/09/21 0250  NA 129*   < > 129* 128* 133*  --  129* 133*  K 3.8   < > 4.4 4.8 4.0  --  4.2 3.8  CL 89*   < > 90* 92* 95*  --  94* 100  CO2 34*   < > 30 28  31   --  27 26  GLUCOSE 262*   < > 208* 378* 196* 373* 288* 216*  BUN 43*   < > 35* 43* 35*  --  37* 27*  CREATININE 1.81*   < > 1.53* 1.72* 1.52*  --  1.54* 1.21  CALCIUM 8.2*   < > 8.3* 8.1* 8.4*  --  7.8* 7.9*  MG 1.8  --   --   --   --   --  1.6* 1.4*   < > = values in this interval not displayed.   GFR: Estimated Creatinine Clearance: 91.2 mL/min (by C-G formula based on SCr  of 1.21 mg/dL). Liver Function Tests: No results for input(s): AST, ALT, ALKPHOS, BILITOT, PROT, ALBUMIN in the last 168 hours.  No results for input(s): LIPASE, AMYLASE in the last 168 hours. No results for input(s): AMMONIA in the last 168 hours. Coagulation Profile: No results for input(s): INR, PROTIME in the last 168 hours. Cardiac Enzymes: No results for input(s): CKTOTAL, CKMB, CKMBINDEX, TROPONINI in the last 168 hours. BNP (last 3 results) No results for input(s): PROBNP in the last 8760 hours. HbA1C: No results for input(s): HGBA1C in the last 72 hours. CBG: Recent Labs  Lab 08/08/21 1124 08/08/21 1619 08/08/21 2213 08/09/21 0806 08/09/21 1129  GLUCAP 244* 152* 233* 104* 197*   Lipid Profile: No results for input(s): CHOL, HDL, LDLCALC, TRIG, CHOLHDL, LDLDIRECT in the last 72 hours. Thyroid Function Tests: No results for input(s): TSH, T4TOTAL, FREET4, T3FREE, THYROIDAB in the last 72 hours. Anemia Panel: No results for input(s): VITAMINB12, FOLATE, FERRITIN, TIBC, IRON, RETICCTPCT in the last 72 hours. Urine analysis:    Component Value Date/Time   COLORURINE YELLOW 07/22/2021 0250   APPEARANCEUR CLOUDY (A) 07/22/2021 0250   LABSPEC 1.018 07/22/2021 0250   PHURINE 5.0 07/22/2021 0250   GLUCOSEU >=500 (A) 07/22/2021 0250   HGBUR SMALL (A) 07/22/2021 0250   BILIRUBINUR NEGATIVE 07/22/2021 0250   KETONESUR 5 (A) 07/22/2021 0250   PROTEINUR 30 (A) 07/22/2021 0250   UROBILINOGEN 0.2 09/13/2019 0906   NITRITE NEGATIVE 07/22/2021 0250   LEUKOCYTESUR TRACE (A) 07/22/2021 0250   No  results found for this or any previous visit (from the past 240 hour(s)).    Radiology Studies: No results found.  Scheduled Meds:  acetaminophen  1,000 mg Oral TID   acyclovir  400 mg Oral TID   amiodarone  200 mg Oral Daily   vitamin C  1,000 mg Oral Daily   Chlorhexidine Gluconate Cloth  6 each Topical Daily   docusate sodium  100 mg Oral Daily   enoxaparin (LOVENOX) injection  100 mg Subcutaneous BID   gabapentin  300 mg Oral TID   hydrALAZINE  25 mg Oral Q8H   insulin aspart  0-15 Units Subcutaneous TID WC   insulin aspart  0-5 Units Subcutaneous QHS   insulin aspart  4 Units Subcutaneous TID WC   insulin detemir  28 Units Subcutaneous BID   isosorbide mononitrate  30 mg Oral Daily   lidocaine  2 patch Transdermal Q24H   metoprolol succinate  50 mg Oral Daily   multivitamin with minerals  1 tablet Oral Daily   nutrition supplement (JUVEN)  1 packet Oral BID BM   oxyCODONE  15 mg Oral Q12H   polyethylene glycol  17 g Oral Daily   Ensure Max Protein  11 oz Oral Daily   Ensure Max Protein  11 oz Oral BID BM   Continuous Infusions:  sodium chloride 10 mL/hr at 07/29/21 0816   ceFEPime (MAXIPIME) IV 2 g (08/09/21 0904)     LOS: 18 days   Time spent: 35 minutes.  Geradine Girt, DO [facility] [email or web address] 08/09/2021, 1:50 PM

## 2021-08-09 NOTE — Progress Notes (Signed)
Subjective: The patient is alert and pleasant.  He still has some back pain but is much better today.  He denies lower extremity numbness, tingling, weakness.  Objective: Vital signs in last 24 hours: Temp:  [98.5 F (36.9 C)-98.7 F (37.1 C)] 98.6 F (37 C) (12/03 0525) Pulse Rate:  [66] 66 (12/02 2228) Resp:  [15-18] 16 (12/03 0525) BP: (123-151)/(50-86) 134/84 (12/03 0525) SpO2:  [98 %-99 %] 98 % (12/03 0525) Estimated body mass index is 29.48 kg/m as calculated from the following:   Height as of this encounter: 6\' 3"  (1.905 m).   Weight as of this encounter: 107 kg.   Intake/Output from previous day: 12/02 0701 - 12/03 0700 In: 240 [P.O.:240] Out: 2901 [Urine:2901] Intake/Output this shift: Total I/O In: -  Out: 1900 [Urine:1900]  Physical exam the patient is alert and pleasant.  His strength is presently normal in his bilateral quadriceps.  His sensation is intact in his lower extremities.  Lab Results: Recent Labs    08/08/21 0204 08/09/21 0250  WBC 7.9 7.4  HGB 12.2* 12.3*  HCT 37.8* 39.2  PLT 360 328   BMET Recent Labs    08/08/21 0204 08/09/21 0250  NA 129* 133*  K 4.2 3.8  CL 94* 100  CO2 27 26  GLUCOSE 288* 216*  BUN 37* 27*  CREATININE 1.54* 1.21  CALCIUM 7.8* 7.9*    Studies/Results: MR THORACIC SPINE W WO CONTRAST  Result Date: 08/07/2021 CLINICAL DATA:  Mid back pain. Strep bacteremia. Suspected thoracic osteomyelitis. On antibiotics. EXAM: MRI THORACIC WITHOUT AND WITH CONTRAST TECHNIQUE: Multiplanar and multiecho pulse sequences of the thoracic spine were obtained without and with intravenous contrast. CONTRAST:  50mL GADAVIST GADOBUTROL 1 MMOL/ML IV SOLN COMPARISON:  MRI thoracic spine without contrast 07/23/2021 FINDINGS: Alignment:  Normal Vertebrae: Progressive bone marrow edema and enhancement throughout the T5, T6, T7, and T8 vertebral bodies. Progressive nonenhancing tissue centered at the T6-7 disc space extending in to T6 and T7  likely due to discitis and adjacent osteomyelitis. Bone marrow edema also extends into the posterior elements at T5 and T6 consistent with extension of osteomyelitis. No fracture identified. Cord: Significant progression of posterior epidural multilocular fluid collection which is compressing the cord and causing spinal stenosis. This is most compatible with epidural abscess which extends from T3 through T9. Moderate spinal stenosis T4 through T8. Paraspinal and other soft tissues: Moderately large loculated pleural effusions bilaterally which are centered at the level of discitis osteomyelitis. Possible reactive effusion or empyema. Paraspinous soft tissue thickening is also present around the vertebra T5 through T8 due to infection. Disc levels: Disc degeneration T5 through T10 with disc space narrowing. Posterior osteophyte at T5-6, T6-7, T7-8, T8-9. IMPRESSION: 1. Significant progression of discitis and osteomyelitis at T5 through T8. There is edema extending into the posterior elements. 2. Extensive posterior epidural abscess has progressed since the prior study. Abscess extends from T3 through T9 and is exerting mass-effect on the cord with moderate spinal stenosis T4 through T8 3. Interval development of moderately large bilateral loculated pleural effusions. Possible empyema versus reactive fluid. 4. These results were called by telephone at the time of interpretation on 08/07/2021 at 3:12 pm to provider Eliseo Squires, who verbally acknowledged these results. Electronically Signed   By: Franchot Gallo M.D.   On: 08/07/2021 15:18   MR Lumbar Spine W Wo Contrast  Result Date: 08/07/2021 CLINICAL DATA:  Low back pain.  Suspect infection EXAM: MRI LUMBAR SPINE WITHOUT AND WITH CONTRAST TECHNIQUE:  Multiplanar and multiecho pulse sequences of the lumbar spine were obtained without and with intravenous contrast. CONTRAST:  74mL GADAVIST GADOBUTROL 1 MMOL/ML IV SOLN COMPARISON:  MRI lumbar spine 07/23/2021 FINDINGS:  Segmentation:  Standard Alignment:  Normal Vertebrae: Negative for fracture or mass. Moderate discogenic edema in the bone marrow at L5-S1 with associated disc space narrowing. Small amount of fluid in the disc space. Minimal enhancement of the endplates. Findings most compatible with disc degeneration. Conus medullaris and cauda equina: Conus extends to the L1 level. Conus and cauda equina appear normal. Paraspinal and other soft tissues: Diffuse paraspinous muscle edema without abscess. Findings compatible with myositis. Small left renal cysts. Disc levels: L1-2: Mild disc bulging.  Negative for stenosis L2-3: Shallow central disc protrusion.  Negative for stenosis L3-4: Mild disc bulging.  Negative for stenosis L4-5: Shallow broad-based disc protrusion and mild facet degeneration. Negative for stenosis L5-S1. Advanced disc degeneration with disc space narrowing. Diffuse endplate spurring. Bone marrow edema most compatible with disc degeneration. Small central disc protrusion is unchanged. Mild facet degeneration. Moderate subarticular and foraminal stenosis bilaterally. IMPRESSION: 1. No evidence of discitis/osteomyelitis in the lumbar spine. There is progressive muscle edema in the paraspinous muscles bilaterally compatible with myositis. No abscess 2. Advanced disc degeneration at L5-S1 with discogenic bone marrow edema. Electronically Signed   By: Franchot Gallo M.D.   On: 08/07/2021 14:51    Assessment/Plan: Thoracic discitis, osteomyelitis, epidural abscess: The patient seems to be improving on antibiotics.  LOS: 18 days     Ophelia Charter 08/09/2021, 8:33 AM

## 2021-08-09 NOTE — Progress Notes (Addendum)
Pharmacy Antibiotic Note  [name] is a 55 y.o. male admitted on 07/21/2021 with sepsis, foot infection and spinal osteomyelitis.  Pharmacy has been consulted for vancomycin and cefepime dosing.  On cefepime 2g IV q 8 hrs and vancomycin 750 mg IV q 8 hrs.  Vancomycin levels checked today.  Peak = 24, trough = 12, and calculated AUC within goal range at 484.  Plan: Continue cefepime 2g IV q 8 hrs. Continue vancomycin 750 mg q 8 hrs. F/u LOT.   Height: 6\' 3"  (190.5 cm) Weight: 107 kg (235 lb 14.3 oz) IBW/kg (Calculated) : 84.5  Temp (24hrs), Avg:98.4 F (36.9 C), Min:98.2 F (36.8 C), Max:98.6 F (37 C)  Recent Labs  Lab 08/04/21 0221 08/05/21 0214 08/06/21 0158 08/07/21 0302 08/08/21 0204 08/09/21 0250 08/09/21 1215 08/09/21 2014  WBC 11.9*  --  10.8*  --  7.9 7.4  --   --   CREATININE 1.72* 1.53* 1.72* 1.52* 1.54* 1.21  --   --   VANCOTROUGH  --   --   --   --   --   --   --  12*  VANCOPEAK  --   --   --   --   --   --  24*  --     Estimated Creatinine Clearance: 91.2 mL/min (by C-G formula based on SCr of 1.21 mg/dL).    Allergies  Allergen Reactions   Bactrim [Sulfamethoxazole-Trimethoprim]    Ceprotin [Protein C Concentrate (Human)]    Ciprofloxacin Other (See Comments)    Kidney function   Levaquin [Levofloxacin]     11/14 Ceftriaxone x1 11/14 Vancomycin >> 11/15, 12/1>> 12/1 Cefepime>> 11/15 Zosyn >> 11/16 11/15 Clindamycin >>11/15 11/15 Linezolid>> 11/16 11/16 Rocephin>>11/18 11/18 Penicillin G>>12/1 by ID (2 weeks IV therapy, then 4-6 weeks amox until 09/14/21) 12/2 acyclovir for 7 days   11/17 BCx: ngF 11/16 MRS PCR + 11/14 BCx: GBS pan S 11/15 UCx: GBS   Thank you for allowing pharmacy to be a part of this patient's care.  [place] Crane, Roylene Reason, BCCP Clinical Pharmacist  08/09/2021 10:33 PM   [facility] pharmacy phone numbers are listed on [email or web address]

## 2021-08-10 ENCOUNTER — Inpatient Hospital Stay (HOSPITAL_COMMUNITY): Payer: Medicare HMO

## 2021-08-10 DIAGNOSIS — J9811 Atelectasis: Secondary | ICD-10-CM | POA: Diagnosis not present

## 2021-08-10 DIAGNOSIS — I700 Atherosclerosis of aorta: Secondary | ICD-10-CM | POA: Diagnosis not present

## 2021-08-10 DIAGNOSIS — M4644 Discitis, unspecified, thoracic region: Secondary | ICD-10-CM | POA: Diagnosis not present

## 2021-08-10 DIAGNOSIS — R911 Solitary pulmonary nodule: Secondary | ICD-10-CM | POA: Diagnosis not present

## 2021-08-10 DIAGNOSIS — J90 Pleural effusion, not elsewhere classified: Secondary | ICD-10-CM | POA: Diagnosis not present

## 2021-08-10 LAB — CBC
HCT: 40 % (ref 39.0–52.0)
Hemoglobin: 12.4 g/dL — ABNORMAL LOW (ref 13.0–17.0)
MCH: 28.4 pg (ref 26.0–34.0)
MCHC: 31 g/dL (ref 30.0–36.0)
MCV: 91.5 fL (ref 80.0–100.0)
Platelets: 328 10*3/uL (ref 150–400)
RBC: 4.37 MIL/uL (ref 4.22–5.81)
RDW: 14.7 % (ref 11.5–15.5)
WBC: 6.2 10*3/uL (ref 4.0–10.5)
nRBC: 0 % (ref 0.0–0.2)

## 2021-08-10 LAB — BASIC METABOLIC PANEL
Anion gap: 8 (ref 5–15)
BUN: 29 mg/dL — ABNORMAL HIGH (ref 6–20)
CO2: 24 mmol/L (ref 22–32)
Calcium: 7.9 mg/dL — ABNORMAL LOW (ref 8.9–10.3)
Chloride: 100 mmol/L (ref 98–111)
Creatinine, Ser: 1.15 mg/dL (ref 0.61–1.24)
GFR, Estimated: >60 mL/min (ref >60)
Glucose, Bld: 229 mg/dL — ABNORMAL HIGH (ref 70–99)
Potassium: 4.3 mmol/L (ref 3.5–5.1)
Sodium: 132 mmol/L — ABNORMAL LOW (ref 135–145)

## 2021-08-10 LAB — GLUCOSE, CAPILLARY
Glucose-Capillary: 159 mg/dL — ABNORMAL HIGH (ref 70–99)
Glucose-Capillary: 157 mg/dL — ABNORMAL HIGH (ref 70–99)
Glucose-Capillary: 148 mg/dL — ABNORMAL HIGH (ref 70–99)
Glucose-Capillary: 220 mg/dL — ABNORMAL HIGH (ref 70–99)

## 2021-08-10 IMAGING — CT CT CHEST W/ CM
2 of 4 series · 15 of 36 positions shown, 18 images · IV contrast (omnipaque)
Comparison: MR thoracic spine [DATE]

CLINICAL DATA: Pneumonia, effusion or abscess suspected.

EXAM:
CT CHEST WITH CONTRAST
TECHNIQUE: Multidetector CT imaging of the chest was performed during
intravenous contrast administration.
CONTRAST:  75mL OMNIPAQUE IOHEXOL 300 MG/ML  SOLN

[Series 3: chest w · axial · 0.77mm/px · z∈[+1169,+1481]mm · 12 of 184 slices shown, 15 images]
[im 14/184  mediastinal]
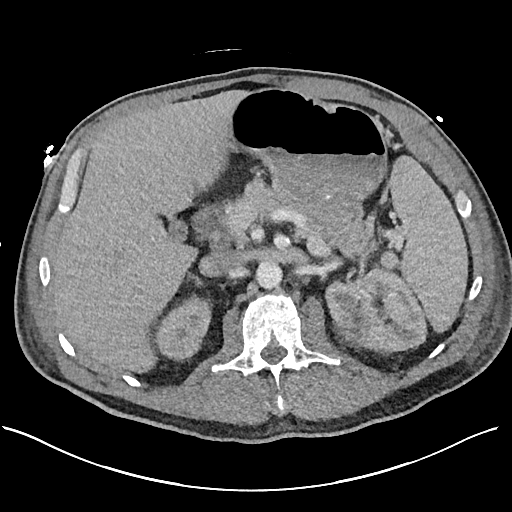
[im 14/184  lung]
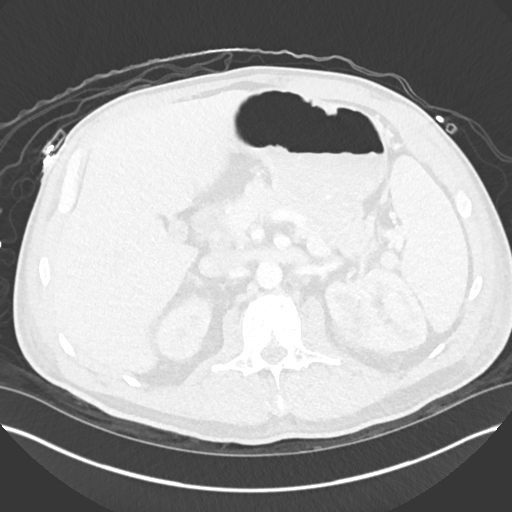
[im 27/184  lung]
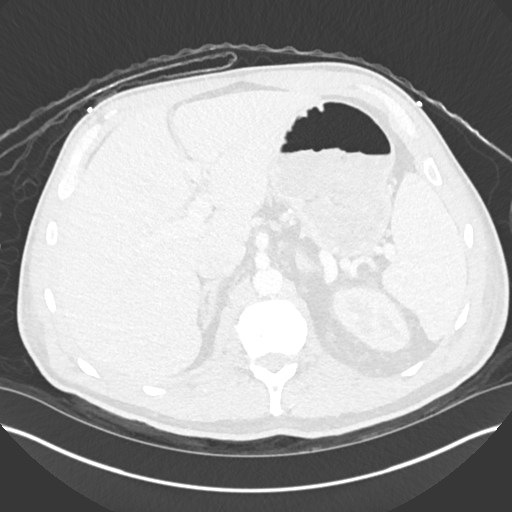
[im 40/184  lung]
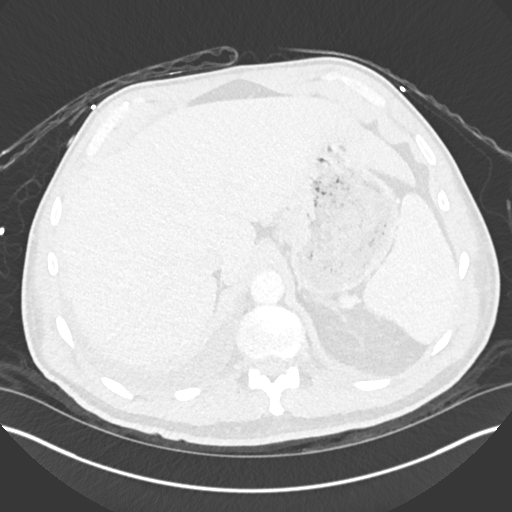
[im 53/184  lung]
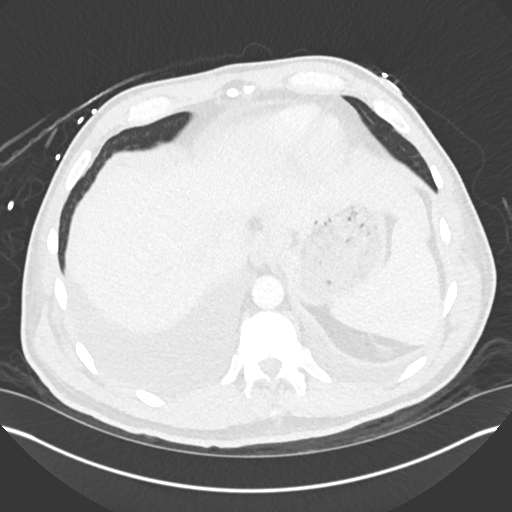
[im 66/184  mediastinal]
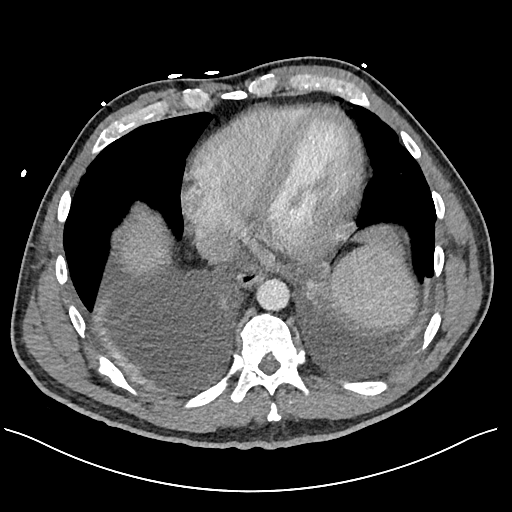
[im 66/184  lung]
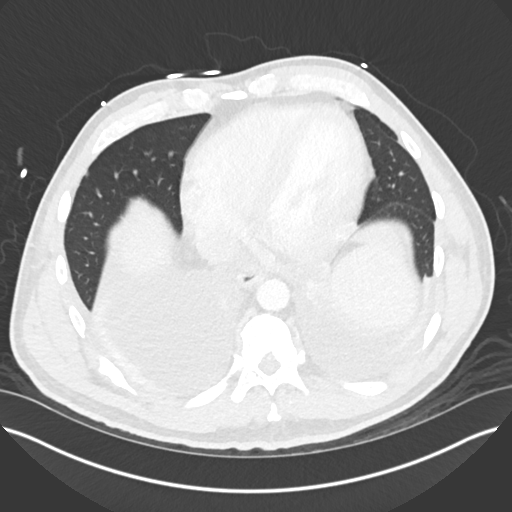
[im 79/184  lung]
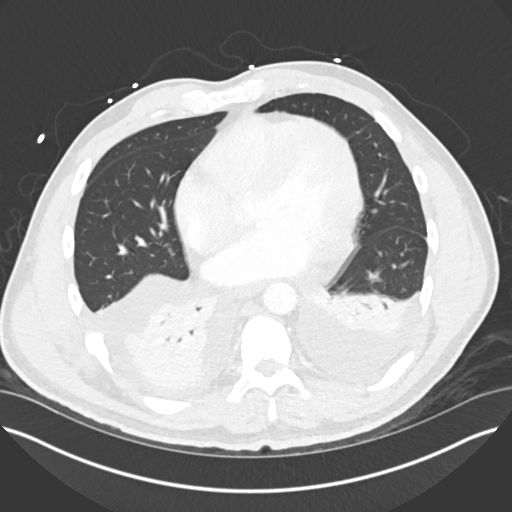
[im 105/184  lung]
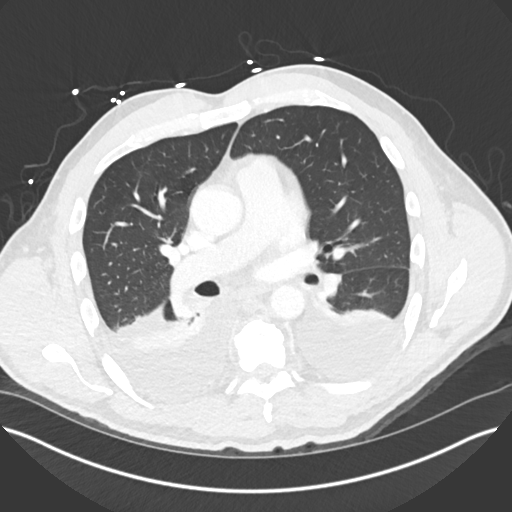
[im 118/184  lung]
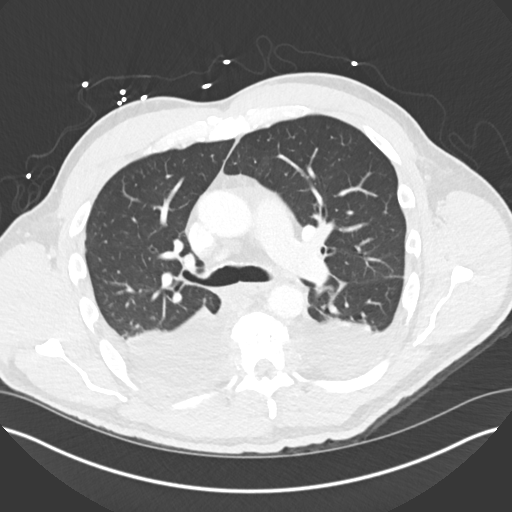
[im 131/184  mediastinal]
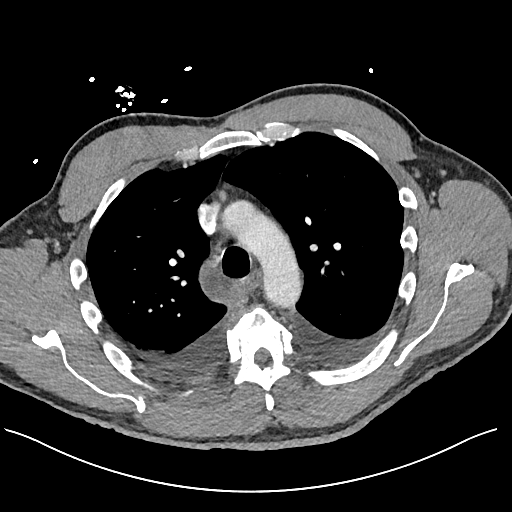
[im 131/184  lung]
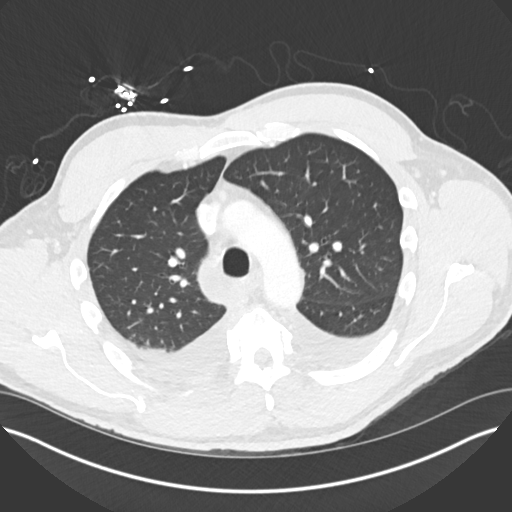
[im 144/184  lung]
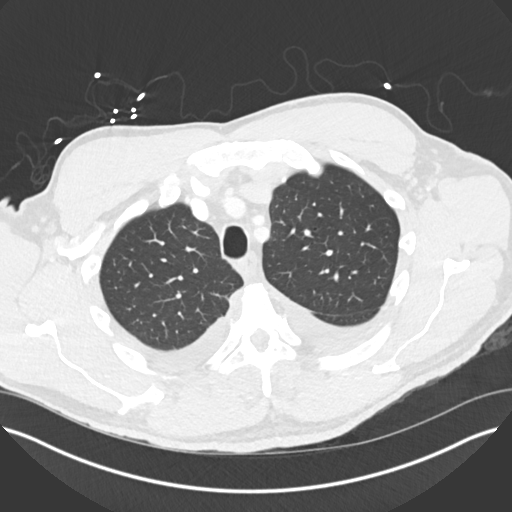
[im 157/184  lung]
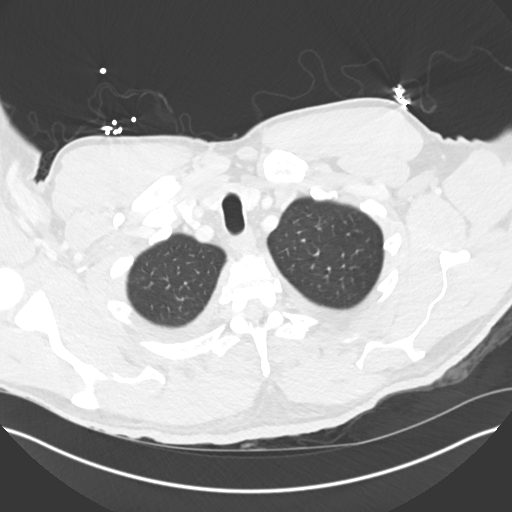
[im 170/184  lung]
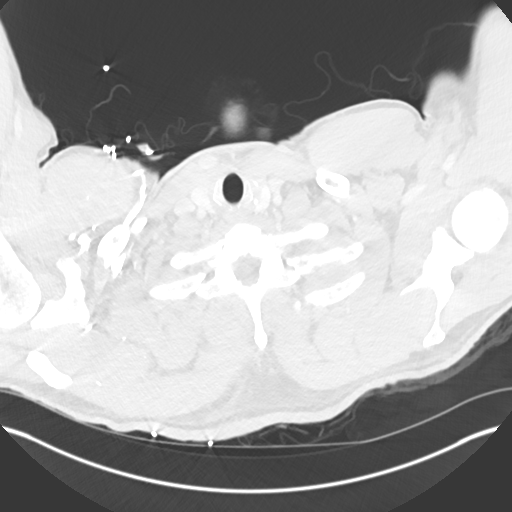

[Series 6: cor · coronal · 0.74mm/px · 3 of 187 slices shown]
[im 38/187  lung]
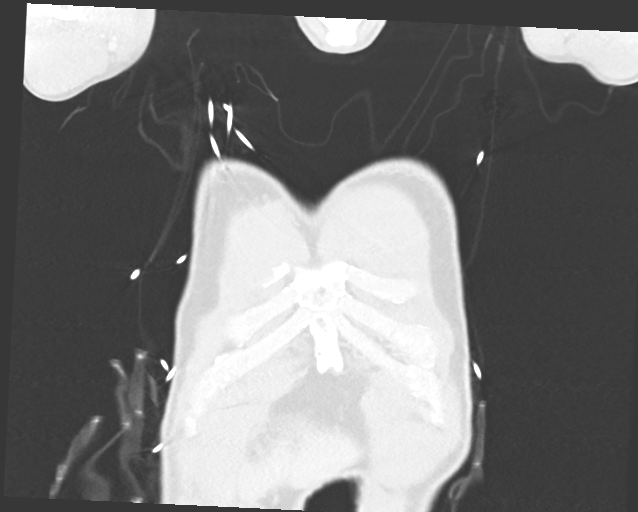
[im 75/187  lung]
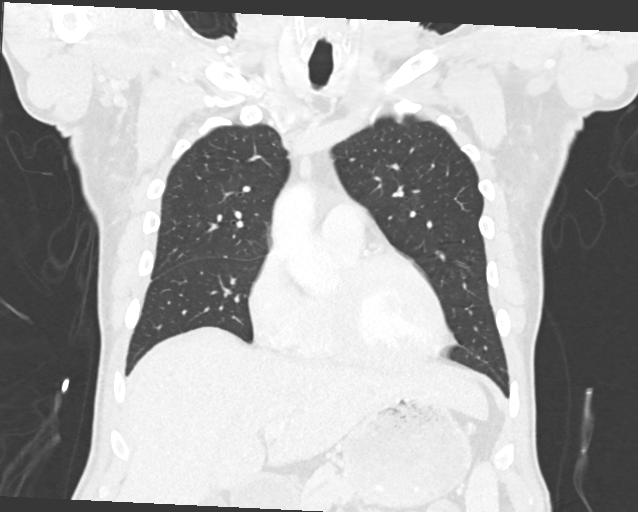
[im 112/187  lung]
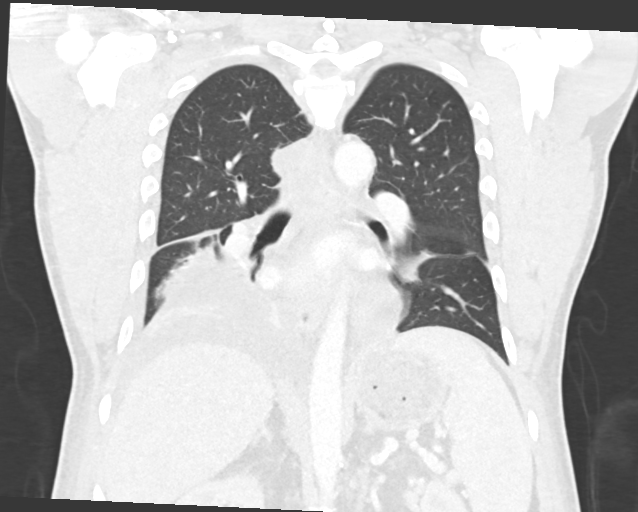

[15 of 36 positions shown; findings below may reference images not displayed]

FINDINGS: Cardiovascular: Calcific atherosclerotic disease of the coronary
arteries. Normal heart size. No pericardial effusion.

Mediastinum/Nodes: No enlarged mediastinal, hilar, or axillary lymph
nodes. Thyroid gland, trachea, and esophagus demonstrate no
significant findings.

Lungs/Pleura: Moderate in size bilateral pleural effusions, measure
water density. Bibasilar compressive atelectasis dependently. 3 mm
perifissural soft tissue nodule in the superior segment of the right
lower lobe.

Upper Abdomen: Partially visualized hypoechoic mass in the posterior
pole of the left kidney measures 3 cm

Musculoskeletal: Sclerotic changes in T5, T6, T7, T8 and T9
vertebral bodies. Effacement of the intervening disc spaces. The
paraspinal soft tissue thickening, and the posterior epidural fluid
collection is better seen on the previous MRI. The effect of the
spinal cord cannot be evaluated.
IMPRESSION: 1. Moderate in size bilateral pleural effusions measure water
density.
2. Bibasilar compressive atelectasis dependently.
3. 3 mm perifissural soft tissue nodule in the superior segment of
the right lower lobe. This may represent a subpleural lymph node.
4. Partially visualized hypoechoic mass in the posterior pole of the
left kidney measures 3 cm. Further evaluation with nonemergent renal
ultrasound or contrast-enhanced MRI may be considered, when
clinically feasible.
5. Sclerotic changes in T5, T6, T7, T8 and T9 vertebral bodies.
Effacement of the intervening disc spaces. The paraspinal soft
tissue thickening, and the posterior epidural fluid collection is
better seen on the previous MRI. The effect of the spinal cord
cannot be evaluated.
6. Calcific atherosclerotic disease of the coronary arteries.
7. Aortic atherosclerosis.

Aortic Atherosclerosis ([5G]-[5G]).

## 2021-08-10 MED ORDER — IOHEXOL 300 MG/ML  SOLN
75.0000 mL | Freq: Once | INTRAMUSCULAR | Status: AC | PRN
  Administered 2021-08-10: 09:00:00 75 mL via INTRAVENOUS

## 2021-08-10 MED ORDER — TORSEMIDE 20 MG PO TABS
20.0000 mg | ORAL_TABLET | Freq: Every day | ORAL | Status: DC
  Administered 2021-08-11: 20 mg via ORAL
  Filled 2021-08-10: qty 1

## 2021-08-10 NOTE — Progress Notes (Signed)
  NEUROSURGERY PROGRESS NOTE   No issues overnight. He reports chronic LBP related to spondylosis managed outpatient by Dr. Nelva Bush. No new LE weakness.  EXAM:  BP 131/68 (BP Location: Right Arm)   Pulse 63   Temp 98.4 F (36.9 C) (Oral)   Resp 20   Ht 6\' 3"  (1.905 m)   Wt 107 kg   SpO2 98%   BMI 29.48 kg/m   Awake, alert, oriented  Speech fluent, appropriate  CN grossly intact  Good quad strength BLE  IMPRESSION:  54 y.o. male with thoracic SEA with good BLE motor exam  PLAN: - Cont abx and monitor motor exam   [name] Lose, MD [facility]

## 2021-08-10 NOTE — Progress Notes (Signed)
PROGRESS NOTE  [name]  FTD:[id number] DOB: October 26, 1965 DOA: 07/21/2021 PCP: Patient, No Pcp Per (Inactive)   Brief Narrative: 55 year old male with history of diabetes mellitus type 2, diabetic foot infection with prior right BKA, chronic diastolic CHF, hypertension, dyslipidemia presented with worsening left foot swelling/discoloration and discharge.  On presentation, he was hypotensive, tachycardic with A. fib with RVR with CT suggestive of first MTP osteomyelitis and soft tissue gas concerning for possible necrotizing fasciitis along with AKI with creatinine of 2.7 and mild DKA.  His blood pressures did not improve with IV fluids and he was transferred to ICU and PCCM was consulted.  Orthopedics was consulted.  He underwent left transtibial amputation on 07/23/2021.  He was found to have strep agalactiae bacteremia.  ID was consulted.  He was found to have vertebral discitis versus osteomyelitis on MRI.  He was also found to have new systolic dysfunction with LVEF of 35% along with new onset atrial flutter requiring IV amiodarone which has been switched to oral.  Cardiology was consulted.  He has been started on Eliquis.  S/p TEE followed by DCCV on 11/22.  Developed worsening back pain 11/26-11/27, ID reconsulted, requested MRI T/L-spine with contrast. This requires general anesthesia and was performed 11/29. Unfortunately diagnostic images could not be obtained.  Repeat MRI done 12/1 and shows:  1. Significant progression of discitis and osteomyelitis at T5 through T8. There is edema extending into the posterior elements. 2. Extensive posterior epidural abscess has progressed since the prior study. Abscess extends from T3 through T9 and is exerting mass-effect on the cord with moderate spinal stenosis T4 through T8 3. Interval development of moderately large bilateral loculated pleural effusions. Possible empyema versus reactive fluid. Attempted thoracentesis on 12/2 but patient not able to  lay in a position to get procedure done-- IR said will need to be done under anesthesia. CT scan chest shows fluid, no abscess   Assessment & Plan: Principal Problem:   Discitis of thoracic region Active Problems:   Essential hypertension   Stage 3a chronic kidney disease (HCC)   Type 2 diabetes mellitus with diabetic polyneuropathy, with long-term current use of insulin (HCC)   Right below-knee amputee (HCC)   Diabetic infection of left foot (HCC)   Chronic diastolic (congestive) heart failure (HCC)   Severe sepsis with acute organ dysfunction (HCC)   Chest pain   AKI (acute kidney injury) ([place])   Gas gangrene of foot (HCC)   Atrial flutter (HCC)   Septic shock (HCC)   Systolic dysfunction   Pressure injury of skin   MSSA bacteremia   Septic shock: Evolving on admission from bacteremia/diabetic foot infection -Off pressors.  Shock resolved. -Required care in ICU under PCCM service: Care has been transferred to [facility] on 07/27/2021   Diabetic foot infection, left: Status post left transtibial amputation on 07/23/2021. - Wound/wound VAC care as per Dr. Sharol Given.    Group B streptococcus bacteremia, worsening thoracic discitis/osteomyelitis.  -No vegetation on TTE or TEE during cardioversion.   -MRI 12/1: 1. Significant progression of discitis and osteomyelitis at T5 through T8. There is edema extending into the posterior elements. 2. Extensive posterior epidural abscess has progressed since the prior study. Abscess extends from T3 through T9 and is exerting mass-effect on the cord with moderate spinal stenosis T4 through T8 3. Interval development of moderately large bilateral loculated pleural effusions. Possible empyema versus reactive fluid. - Neurosurgery re-consulted on 12/1- spoke with Dr. Ronnald Ramp- surgery would have to be delayed until  next week due to elquis dose on 12/1 -patient moving both legs -ID notified of MRI findings and abx changed on 12/1: vancomycin and  cefepime -blood cultures from 07/24/2021 were negative   AKI on stage IIIa CKD: AKI resolving with limitation on diuresis. Had coronary CTA done earlier this year, no implanted devices. I.  -Cr stable -daily labs  Pt takes testosterone (not prescription)/anabolic steroids:  - total is low -free low -reviewed literature-- needs 2-3 low results   Hyponatremia:  -stable  Pleural effusion/?loculation -IR consult for thoracentesis/chest tube- was not able to do on 12/2 as he was not able to lay flat -culture with procedure -CT scan of lungs 12/4:   New onset systolic CHF: Echo showed EF of 35% (previous echo showed EF of 50 to 55% in 04/2021). Cardiomyopathy may be from new onset atrial flutter RVR related tachycardia versus stress induced cardiomyopathy from bacteremia and septic shock. - per cards (signed off 11/30): BB, Hydralazine/Imdur, Torsemide (outpatient diuretic was lasix 40 mg PO daily) -held demadex on 12/1 as he was NPO and BP on lower side- resume 12/5   New onset atrial flutter: Maintained NSR since DCCV 11/22 - Continue metoprolol succinate 50 Mg daily. - Continue amiodarone 200mg  twice daily x7 days and then 200 Mg daily. - spoke with cards: Ideally, patients remain on anticoagulation ~ 30 days after DCCV in order to decrease stroke risk.  Given is need for significant surgery and procedures and that he would have significant ricks if these were not down, the risk vs benefits would favor heparin and stopping as need per surgery and proceduralists for these infection control procedures.  He has been controled in rhythm with amiodarone loading and maintenance. - DOAC-> lovenox (held heparin as having difficulty getting therapeutic)   Hypomagnesemia: Improved with supplementation.  - replete   Leukocytosis: resolved   T2DM uncontrolled with hyperglycemia:  - Increased Levemir, continue NovoLog 3 times daily with meals and SSI.  Further titrate insulins as  needed.  Thrombocytopenia - Resolved   Generalized deconditioning - PT recommends CIR which will be pursued once work up has been completed.    Acute on chronic pain: Follows with Dr. Nelva Bush, pain management. - Reviewed [place] PDMP and patient regularly fills opioid prescriptions. Likely opioid-dependent. - Obviously has reasons for acute pain from recent surgery, discitis/osteomyelitis. - Evaluating etiology for worsening back pain as noted above. Patient on following regimen after making changes: - Tylenol 1 g 3 times daily. - Oxy IR 10 mg every 4 hourly as needed for moderate pain - OxyContin 15 Mg every 12 hours - Lidocaine patch increased to 2 patches - Dilaudid 0.5-1 mg IV every 4 hourly as needed severe pain - Gabapentin 300 mg 3 times daily   Insomnia - Started trazodone 50 Mg at bedtime as needed   RN Pressure Injury Documentation: Pressure Injury 07/27/21 Sacrum Mid Stage 2 -  Partial thickness loss of dermis presenting as a shallow open injury with a red, pink wound bed without slough. small pink pressure ulcer with break in skin (Active)  07/27/21 0255  Location: Sacrum  Location Orientation: Mid  Staging: Stage 2 -  Partial thickness loss of dermis presenting as a shallow open injury with a red, pink wound bed without slough.  Wound Description (Comments): small pink pressure ulcer with break in skin  Present on Admission: Yes  -placed foley for now to aid with healing   Penile ulcers/wounds -r/o herpes- treat in meantime with acyclovir -r/o G/C  pending -suspect could be moisture associated  Severe protein calorie malnutrition Nutrition Status: Nutrition Problem: Increased nutrient needs Etiology: wound healing Signs/Symptoms: estimated needs Interventions: Glucerna shake, Premier Protein, Juven, MVI    DVT prophylaxis: heparin Code Status: Full Disposition Plan:  Status is: Inpatient  Remains inpatient appropriate because: sick/needs NS  intervention  Consultants:  ID Neurosurgery PCCM Cardiology  Procedures:  Left TTA 07/23/2021 TEE/DCCV 07/29/2021    Subjective: Says he is due for a nerve block with Dr. Nelva Bush.  Asking is he can get done here.  Says he is not able to ambulate due to SEVERE low back pain not relieved by narcotics  Objective: Vitals:   08/09/21 2121 08/10/21 0615 08/10/21 1406 08/10/21 1512  BP: 134/76 131/68 (!) 159/63 (!) 141/72  Pulse: 60 63 67 65  Resp: 19 20 19 15   Temp: 98.2 F (36.8 C) 98.4 F (36.9 C) 97.9 F (36.6 C) 97.9 F (36.6 C)  TempSrc: Oral Oral Oral Oral  SpO2: 98% 98% 96% 96%  Weight:      Height:        Intake/Output Summary (Last 24 hours) at 08/10/2021 1602 Last data filed at 08/10/2021 1100 Gross per 24 hour  Intake 666.08 ml  Output 3800 ml  Net -3133.92 ml   Filed Weights   07/27/21 0252 07/29/21 0754 08/01/21 0459  Weight: 108.2 kg 108.2 kg 107 kg    General: Appearance:     Overweight male in no acute distress     Lungs:     respirations unlabored  Heart:    Normal heart rate.   MS:   Below knee amputation of right lower extremity is noted. Left below knee amputation noted.   Neurologic:   Awake, alert, oriented x 3. No apparent focal neurological           defect.         Data Reviewed: I have personally reviewed following labs and imaging studies  CBC: Recent Labs  Lab 08/04/21 0221 08/06/21 0158 08/08/21 0204 08/09/21 0250 08/10/21 0408  WBC 11.9* 10.8* 7.9 7.4 6.2  NEUTROABS  --  9.9*  --   --   --   HGB 12.4* 12.4* 12.2* 12.3* 12.4*  HCT 38.8* 38.2* 37.8* 39.2 40.0  MCV 88.8 89.0 89.2 90.3 91.5  PLT 386 413* 360 328 643   Basic Metabolic Panel: Recent Labs  Lab 08/06/21 0158 08/07/21 0302 08/07/21 2233 08/08/21 0204 08/09/21 0250 08/10/21 0408  NA 128* 133*  --  129* 133* 132*  K 4.8 4.0  --  4.2 3.8 4.3  CL 92* 95*  --  94* 100 100  CO2 28 31  --  27 26 24   GLUCOSE 378* 196* 373* 288* 216* 229*  BUN 43* 35*  --  37*  27* 29*  CREATININE 1.72* 1.52*  --  1.54* 1.21 1.15  CALCIUM 8.1* 8.4*  --  7.8* 7.9* 7.9*  MG  --   --   --  1.6* 1.4*  --    GFR: Estimated Creatinine Clearance: 96 mL/min (by C-G formula based on SCr of 1.15 mg/dL). Liver Function Tests: No results for input(s): AST, ALT, ALKPHOS, BILITOT, PROT, ALBUMIN in the last 168 hours.  No results for input(s): LIPASE, AMYLASE in the last 168 hours. No results for input(s): AMMONIA in the last 168 hours. Coagulation Profile: No results for input(s): INR, PROTIME in the last 168 hours. Cardiac Enzymes: No results for input(s): CKTOTAL, CKMB, CKMBINDEX, TROPONINI in the  last 168 hours. BNP (last 3 results) No results for input(s): PROBNP in the last 8760 hours. HbA1C: No results for input(s): HGBA1C in the last 72 hours. CBG: Recent Labs  Lab 08/09/21 1631 08/09/21 2118 08/10/21 0723 08/10/21 1119 08/10/21 1551  GLUCAP 196* 169* 159* 157* 148*   Lipid Profile: No results for input(s): CHOL, HDL, LDLCALC, TRIG, CHOLHDL, LDLDIRECT in the last 72 hours. Thyroid Function Tests: No results for input(s): TSH, T4TOTAL, FREET4, T3FREE, THYROIDAB in the last 72 hours. Anemia Panel: No results for input(s): VITAMINB12, FOLATE, FERRITIN, TIBC, IRON, RETICCTPCT in the last 72 hours. Urine analysis:    Component Value Date/Time   COLORURINE YELLOW 07/22/2021 0250   APPEARANCEUR CLOUDY (A) 07/22/2021 0250   LABSPEC 1.018 07/22/2021 0250   PHURINE 5.0 07/22/2021 0250   GLUCOSEU >=500 (A) 07/22/2021 0250   HGBUR SMALL (A) 07/22/2021 0250   BILIRUBINUR NEGATIVE 07/22/2021 0250   KETONESUR 5 (A) 07/22/2021 0250   PROTEINUR 30 (A) 07/22/2021 0250   UROBILINOGEN 0.2 09/13/2019 0906   NITRITE NEGATIVE 07/22/2021 0250   LEUKOCYTESUR TRACE (A) 07/22/2021 0250   No results found for this or any previous visit (from the past 240 hour(s)).    Radiology Studies: CT CHEST W CONTRAST  Result Date: 08/10/2021 CLINICAL DATA:  Pneumonia, effusion or  abscess suspected. EXAM: CT CHEST WITH CONTRAST TECHNIQUE: Multidetector CT imaging of the chest was performed during intravenous contrast administration. CONTRAST:  74mL OMNIPAQUE IOHEXOL 300 MG/ML  SOLN COMPARISON:  MR thoracic spine August 07, 2021 FINDINGS: Cardiovascular: Calcific atherosclerotic disease of the coronary arteries. Normal heart size. No pericardial effusion. Mediastinum/Nodes: No enlarged mediastinal, hilar, or axillary lymph nodes. Thyroid gland, trachea, and esophagus demonstrate no significant findings. Lungs/Pleura: Moderate in size bilateral pleural effusions, measure water density. Bibasilar compressive atelectasis dependently. 3 mm perifissural soft tissue nodule in the superior segment of the right lower lobe. Upper Abdomen: Partially visualized hypoechoic mass in the posterior pole of the left kidney measures 3 cm Musculoskeletal: Sclerotic changes in T5, T6, T7, T8 and T9 vertebral bodies. Effacement of the intervening disc spaces. The paraspinal soft tissue thickening, and the posterior epidural fluid collection is better seen on the previous MRI. The effect of the spinal cord cannot be evaluated. IMPRESSION: 1. Moderate in size bilateral pleural effusions measure water density. 2. Bibasilar compressive atelectasis dependently. 3. 3 mm perifissural soft tissue nodule in the superior segment of the right lower lobe. This may represent a subpleural lymph node. 4. Partially visualized hypoechoic mass in the posterior pole of the left kidney measures 3 cm. Further evaluation with nonemergent renal ultrasound or contrast-enhanced MRI may be considered, when clinically feasible. 5. Sclerotic changes in T5, T6, T7, T8 and T9 vertebral bodies. Effacement of the intervening disc spaces. The paraspinal soft tissue thickening, and the posterior epidural fluid collection is better seen on the previous MRI. The effect of the spinal cord cannot be evaluated. 6. Calcific atherosclerotic disease of  the coronary arteries. 7. Aortic atherosclerosis. Aortic Atherosclerosis (ICD10-I70.0). Electronically Signed   By: Fidela Salisbury M.D.   On: 08/10/2021 10:51    Scheduled Meds:  acetaminophen  1,000 mg Oral TID   acyclovir  400 mg Oral TID   amiodarone  200 mg Oral Daily   vitamin C  1,000 mg Oral Daily   Chlorhexidine Gluconate Cloth  6 each Topical Daily   docusate sodium  100 mg Oral Daily   enoxaparin (LOVENOX) injection  100 mg Subcutaneous BID   gabapentin  300 mg Oral TID   insulin aspart  0-15 Units Subcutaneous TID WC   insulin aspart  0-5 Units Subcutaneous QHS   insulin aspart  4 Units Subcutaneous TID WC   insulin detemir  28 Units Subcutaneous BID   isosorbide mononitrate  30 mg Oral Daily   lidocaine  2 patch Transdermal Q24H   metoprolol succinate  50 mg Oral Daily   multivitamin with minerals  1 tablet Oral Daily   nutrition supplement (JUVEN)  1 packet Oral BID BM   oxyCODONE  15 mg Oral Q12H   polyethylene glycol  17 g Oral Daily   Ensure Max Protein  11 oz Oral BID BM   Continuous Infusions:  sodium chloride 10 mL/hr at 07/29/21 0816   ceFEPime (MAXIPIME) IV 2 g (08/10/21 1016)   vancomycin 750 mg (08/10/21 1114)     LOS: 19 days   Time spent: 35 minutes.  Geradine Girt, DO [facility] [email or web address] 08/10/2021, 4:02 PM

## 2021-08-11 DIAGNOSIS — E1142 Type 2 diabetes mellitus with diabetic polyneuropathy: Secondary | ICD-10-CM | POA: Diagnosis not present

## 2021-08-11 DIAGNOSIS — Z794 Long term (current) use of insulin: Secondary | ICD-10-CM | POA: Diagnosis not present

## 2021-08-11 DIAGNOSIS — E11628 Type 2 diabetes mellitus with other skin complications: Secondary | ICD-10-CM | POA: Diagnosis not present

## 2021-08-11 DIAGNOSIS — N179 Acute kidney failure, unspecified: Secondary | ICD-10-CM | POA: Diagnosis not present

## 2021-08-11 DIAGNOSIS — B951 Streptococcus, group B, as the cause of diseases classified elsewhere: Secondary | ICD-10-CM

## 2021-08-11 DIAGNOSIS — M4644 Discitis, unspecified, thoracic region: Secondary | ICD-10-CM | POA: Diagnosis not present

## 2021-08-11 DIAGNOSIS — L089 Local infection of the skin and subcutaneous tissue, unspecified: Secondary | ICD-10-CM | POA: Diagnosis not present

## 2021-08-11 DIAGNOSIS — R7881 Bacteremia: Secondary | ICD-10-CM | POA: Diagnosis not present

## 2021-08-11 LAB — BASIC METABOLIC PANEL
Anion gap: 7 (ref 5–15)
BUN: 25 mg/dL — ABNORMAL HIGH (ref 6–20)
CO2: 24 mmol/L (ref 22–32)
Calcium: 8.1 mg/dL — ABNORMAL LOW (ref 8.9–10.3)
Chloride: 102 mmol/L (ref 98–111)
Creatinine, Ser: 1.12 mg/dL (ref 0.61–1.24)
GFR, Estimated: >60 mL/min (ref >60)
Glucose, Bld: 181 mg/dL — ABNORMAL HIGH (ref 70–99)
Potassium: 4.2 mmol/L (ref 3.5–5.1)
Sodium: 133 mmol/L — ABNORMAL LOW (ref 135–145)

## 2021-08-11 LAB — CBC
HCT: 40.1 % (ref 39.0–52.0)
Hemoglobin: 12.9 g/dL — ABNORMAL LOW (ref 13.0–17.0)
MCH: 29.1 pg (ref 26.0–34.0)
MCHC: 32.2 g/dL (ref 30.0–36.0)
MCV: 90.3 fL (ref 80.0–100.0)
Platelets: 301 10*3/uL (ref 150–400)
RBC: 4.44 MIL/uL (ref 4.22–5.81)
RDW: 14.6 % (ref 11.5–15.5)
WBC: 6.6 10*3/uL (ref 4.0–10.5)
nRBC: 0 % (ref 0.0–0.2)

## 2021-08-11 LAB — GLUCOSE, CAPILLARY
Glucose-Capillary: 166 mg/dL — ABNORMAL HIGH (ref 70–99)
Glucose-Capillary: 128 mg/dL — ABNORMAL HIGH (ref 70–99)
Glucose-Capillary: 158 mg/dL — ABNORMAL HIGH (ref 70–99)
Glucose-Capillary: 211 mg/dL — ABNORMAL HIGH (ref 70–99)

## 2021-08-11 LAB — GC/CHLAMYDIA PROBE AMP (~~LOC~~) NOT AT ARMC
Chlamydia: NEGATIVE
Comment: NEGATIVE
Comment: NORMAL
Neisseria Gonorrhea: NEGATIVE

## 2021-08-11 LAB — MAGNESIUM: Magnesium: 1.6 mg/dL — ABNORMAL LOW (ref 1.7–2.4)

## 2021-08-11 MED ORDER — MAGNESIUM SULFATE 2 GM/50ML IV SOLN
2.0000 g | Freq: Once | INTRAVENOUS | Status: AC
  Administered 2021-08-11: 2 g via INTRAVENOUS
  Filled 2021-08-11: qty 50

## 2021-08-11 MED ORDER — METHOCARBAMOL 500 MG PO TABS
1000.0000 mg | ORAL_TABLET | Freq: Three times a day (TID) | ORAL | Status: DC
  Administered 2021-08-11 – 2021-09-09 (×84): 1000 mg via ORAL
  Filled 2021-08-11 (×87): qty 2

## 2021-08-11 MED ORDER — ENOXAPARIN SODIUM 120 MG/0.8ML IJ SOSY
105.0000 mg | PREFILLED_SYRINGE | Freq: Once | INTRAMUSCULAR | Status: AC
  Administered 2021-08-11: 105 mg via SUBCUTANEOUS
  Filled 2021-08-11: qty 0.7

## 2021-08-11 MED ORDER — INSULIN ASPART 100 UNIT/ML IJ SOLN
6.0000 [IU] | Freq: Three times a day (TID) | INTRAMUSCULAR | Status: DC
  Administered 2021-08-11 – 2021-08-17 (×17): 6 [IU] via SUBCUTANEOUS

## 2021-08-11 MED ORDER — ENOXAPARIN SODIUM 120 MG/0.8ML IJ SOSY
105.0000 mg | PREFILLED_SYRINGE | Freq: Two times a day (BID) | INTRAMUSCULAR | Status: DC
  Administered 2021-08-11 – 2021-08-13 (×4): 105 mg via SUBCUTANEOUS
  Filled 2021-08-11 (×5): qty 0.7

## 2021-08-11 MED ORDER — INSULIN DETEMIR 100 UNIT/ML ~~LOC~~ SOLN
30.0000 [IU] | Freq: Two times a day (BID) | SUBCUTANEOUS | Status: DC
  Administered 2021-08-11 – 2021-08-15 (×9): 30 [IU] via SUBCUTANEOUS
  Filled 2021-08-11 (×11): qty 0.3

## 2021-08-11 MED ORDER — METHOCARBAMOL 500 MG PO TABS
1000.0000 mg | ORAL_TABLET | Freq: Three times a day (TID) | ORAL | Status: DC
  Administered 2021-08-11: 1000 mg via ORAL
  Filled 2021-08-11: qty 2

## 2021-08-11 NOTE — Progress Notes (Signed)
PROGRESS NOTE  [name]  BJS:[id number] DOB: Jun 03, 1966 DOA: 07/21/2021 PCP: Patient, No Pcp Per (Inactive)   Brief Narrative: 55 year old male with history of diabetes mellitus type 2, diabetic foot infection with prior right BKA, chronic diastolic CHF, hypertension, dyslipidemia presented with worsening left foot swelling/discoloration and discharge.  On presentation, he was hypotensive, tachycardic with A. fib with RVR with CT suggestive of first MTP osteomyelitis and soft tissue gas concerning for possible necrotizing fasciitis along with AKI with creatinine of 2.7 and mild DKA.  His blood pressures did not improve with IV fluids and he was transferred to ICU and PCCM was consulted.  Orthopedics was consulted.  He underwent left transtibial amputation on 07/23/2021.  He was found to have strep agalactiae bacteremia.  ID was consulted.  He was found to have vertebral discitis versus osteomyelitis on MRI.  He was also found to have new systolic dysfunction with LVEF of 35% along with new onset atrial flutter requiring IV amiodarone which has been switched to oral.  Cardiology was consulted.  He has been started on Eliquis.  S/p TEE followed by DCCV on 11/22.  Developed worsening back pain 11/26-11/27, ID reconsulted, requested MRI T/L-spine with contrast. This requires general anesthesia and was performed 11/29. Unfortunately diagnostic images could not be obtained.  Repeat MRI done 12/1 and shows:  1. Significant progression of discitis and osteomyelitis at T5 through T8. There is edema extending into the posterior elements. 2. Extensive posterior epidural abscess has progressed since the prior study. Abscess extends from T3 through T9 and is exerting mass-effect on the cord with moderate spinal stenosis T4 through T8 3. Interval development of moderately large bilateral loculated pleural effusions. Possible empyema versus reactive fluid. Attempted thoracentesis on 12/2 but patient not able to  lay in a position to get procedure done-- IR said will need to be done under anesthesia. CT scan chest shows fluid, no abscess   Assessment & Plan: Principal Problem:   Discitis of thoracic region Active Problems:   Essential hypertension   Stage 3a chronic kidney disease (HCC)   Type 2 diabetes mellitus with diabetic polyneuropathy, with long-term current use of insulin (HCC)   Right below-knee amputee (HCC)   Diabetic infection of left foot (HCC)   Chronic diastolic (congestive) heart failure (HCC)   Severe sepsis with acute organ dysfunction (HCC)   Chest pain   AKI (acute kidney injury) ([place])   Gas gangrene of foot (HCC)   Atrial flutter (HCC)   Septic shock (HCC)   Systolic dysfunction   Pressure injury of skin   MSSA bacteremia   Septic shock: Evolving on admission from bacteremia/diabetic foot infection -Off pressors.  Shock resolved. -Required care in ICU under PCCM service: Care has been transferred to [facility] on 07/27/2021   Diabetic foot infection, left: Status post left transtibial amputation on 07/23/2021. - Wound/wound VAC care as per Dr. Sharol Given.    Group B streptococcus bacteremia, worsening thoracic discitis/osteomyelitis.  -No vegetation on TTE or TEE during cardioversion.   -MRI 12/1: 1. Significant progression of discitis and osteomyelitis at T5 through T8. There is edema extending into the posterior elements. 2. Extensive posterior epidural abscess has progressed since the prior study. Abscess extends from T3 through T9 and is exerting mass-effect on the cord with moderate spinal stenosis T4 through T8 3. Interval development of moderately large bilateral loculated pleural effusions. Possible empyema versus reactive fluid. - Neurosurgery re-consulted on 12/1- spoke with Dr. Ronnald Ramp- surgery would have to be delayed until  next week due to elquis dose on 12/1 -patient moving both legs -ID notified of MRI findings and abx changed on 12/1: vancomycin - appreciated  recommendations from ID-- will pursue thoracentesis -blood cultures from 07/24/2021 were negative -plan per NS continued abx and no surgery planned for now   AKI on stage IIIa CKD: AKI resolving with limitation on diuresis. Had coronary CTA done earlier this year, no implanted devices.  -Cr stable -daily labs  Pt takes testosterone (not prescription)/anabolic steroids:  - total is low -free low -reviewed literature-- needs 2-3 low results prior to replacement   Hyponatremia:  -stable  Pleural effusion/?loculation -IR consult for thoracentesis/chest tube- was not able to do on 12/2 as he was not able to lay flat -culture with procedure -CT scan of lungs 12/4: fluid but no abscess -will ask IR to try again without anesthesia as he is more comfortable now   New onset systolic CHF: Echo showed EF of 35% (previous echo showed EF of 50 to 55% in 04/2021). Cardiomyopathy may be from new onset atrial flutter RVR related tachycardia versus stress induced cardiomyopathy from bacteremia and septic shock. - per cards (signed off 11/30): BB, Hydralazine/Imdur, Torsemide (outpatient diuretic was lasix 40 mg PO daily) -resume demadax 12/5  New onset atrial flutter: Maintained NSR since DCCV 11/22 - Continue metoprolol succinate 50 Mg daily. - Continue amiodarone 200mg  twice daily x7 days and then 200 Mg daily. - spoke with cards: Ideally, patients remain on anticoagulation ~ 30 days after DCCV in order to decrease stroke risk.  Given is need for significant surgery and procedures and that he would have significant ricks if these were not down, the risk vs benefits would favor heparin and stopping as need per surgery and proceduralists for these infection control procedures.  He has been controled in rhythm with amiodarone loading and maintenance. - DOAC-> lovenox (held heparin as having difficulty getting therapeutic)- once no further procedure will change back to DOAC   Hypomagnesemia:  - replete    Leukocytosis: resolved   T2DM uncontrolled with hyperglycemia:  - Increased Levemir, continue NovoLog 3 times daily with meals and SSI.  Further titrate insulins as needed.  Thrombocytopenia - Resolved   Generalized deconditioning - PT recommends CIR which will be pursued once work up has been completed.    Acute on chronic pain: Follows with Dr. Nelva Bush, pain management. - Reviewed [place] PDMP and patient regularly fills opioid prescriptions. Likely opioid-dependent. - Obviously has reasons for acute pain from recent surgery, discitis/osteomyelitis. - Evaluating etiology for worsening back pain as noted above. Patient on following regimen after making changes: - Tylenol 1 g 3 times daily. - Oxy IR 10 mg every 4 hourly as needed for moderate pain - OxyContin 15 Mg every 12 hours - Lidocaine patch increased to 2 patches - Dilaudid 0.5-1 mg IV every 4 hourly as needed severe pain - Gabapentin 300 mg 3 times daily  -12/5: discussed with Dr. Nelva Bush per patient request the nerve block he is scheduled to undergo--he would not be able to get this until all abx complete and infection treated.  Insomnia - Started trazodone 50 Mg at bedtime as needed   RN Pressure Injury Documentation: Pressure Injury 07/27/21 Sacrum Mid Stage 2 -  Partial thickness loss of dermis presenting as a shallow open injury with a red, pink wound bed without slough. small pink pressure ulcer with break in skin (Active)  07/27/21 0255  Location: Sacrum  Location Orientation: Mid  Staging: Stage 2 -  Partial thickness loss of dermis presenting as a shallow open injury with a red, pink wound bed without slough.  Wound Description (Comments): small pink pressure ulcer with break in skin  Present on Admission: Yes  -placed foley for now to aid with healing   Penile ulcers/wounds -r/o herpes- treat in meantime with acyclovir -r/o G/C pending -suspect could be moisture associated  Severe protein calorie  malnutrition Nutrition Status: Nutrition Problem: Increased nutrient needs Etiology: wound healing Signs/Symptoms: estimated needs Interventions: Glucerna shake, Premier Protein, Juven, MVI    Notified urology that patient in the hospital: follows with Dr. Louis Meckel for kidney nodule   DVT prophylaxis:lovenox Code Status: Full Disposition Plan:  Status is: Inpatient  Remains inpatient appropriate because: sick/needs NS intervention  Consultants:  ID Neurosurgery PCCM Cardiology  Procedures:  Left TTA 07/23/2021 TEE/DCCV 07/29/2021    Subjective: Still with low back pain, no SOB  Objective: Vitals:   08/10/21 1406 08/10/21 1512 08/11/21 0317 08/11/21 0806  BP: (!) 159/63 (!) 141/72  (!) 156/78  Pulse: 67 65  65  Resp: 19 15    Temp: 97.9 F (36.6 C) 97.9 F (36.6 C) 97.9 F (36.6 C)   TempSrc: Oral Oral Oral   SpO2: 96% 96% 95%   Weight:      Height:        Intake/Output Summary (Last 24 hours) at 08/11/2021 1414 Last data filed at 08/11/2021 2010 Gross per 24 hour  Intake 840 ml  Output 4675 ml  Net -3835 ml   Filed Weights   07/27/21 0252 07/29/21 0754 08/01/21 0459  Weight: 108.2 kg 108.2 kg 107 kg     General: Appearance:     Overweight male in no acute distress- appears comfortable     Lungs:     Diminished, respirations unlabored  Heart:    Normal heart rate.    MS:   Below knee amputation of right lower extremity is noted. Left below knee amputation noted.   Neurologic:   Awake, alert, oriented x 3. No apparent focal neurological           defect.           Data Reviewed: I have personally reviewed following labs and imaging studies  CBC: Recent Labs  Lab 08/06/21 0158 08/08/21 0204 08/09/21 0250 08/10/21 0408 08/11/21 0301  WBC 10.8* 7.9 7.4 6.2 6.6  NEUTROABS 9.9*  --   --   --   --   HGB 12.4* 12.2* 12.3* 12.4* 12.9*  HCT 38.2* 37.8* 39.2 40.0 40.1  MCV 89.0 89.2 90.3 91.5 90.3  PLT 413* 360 328 328 071   Basic Metabolic  Panel: Recent Labs  Lab 08/07/21 0302 08/07/21 2233 08/08/21 0204 08/09/21 0250 08/10/21 0408 08/11/21 0301  NA 133*  --  129* 133* 132* 133*  K 4.0  --  4.2 3.8 4.3 4.2  CL 95*  --  94* 100 100 102  CO2 31  --  27 26 24 24   GLUCOSE 196* 373* 288* 216* 229* 181*  BUN 35*  --  37* 27* 29* 25*  CREATININE 1.52*  --  1.54* 1.21 1.15 1.12  CALCIUM 8.4*  --  7.8* 7.9* 7.9* 8.1*  MG  --   --  1.6* 1.4*  --  1.6*   GFR: Estimated Creatinine Clearance: 98.6 mL/min (by C-G formula based on SCr of 1.12 mg/dL). Liver Function Tests: No results for input(s): AST, ALT, ALKPHOS, BILITOT, PROT, ALBUMIN in the last 168 hours.  No results for input(s): LIPASE, AMYLASE in the last 168 hours. No results for input(s): AMMONIA in the last 168 hours. Coagulation Profile: No results for input(s): INR, PROTIME in the last 168 hours. Cardiac Enzymes: No results for input(s): CKTOTAL, CKMB, CKMBINDEX, TROPONINI in the last 168 hours. BNP (last 3 results) No results for input(s): PROBNP in the last 8760 hours. HbA1C: No results for input(s): HGBA1C in the last 72 hours. CBG: Recent Labs  Lab 08/10/21 1119 08/10/21 1551 08/10/21 2038 08/11/21 0620 08/11/21 1132  GLUCAP 157* 148* 220* 166* 128*   Lipid Profile: No results for input(s): CHOL, HDL, LDLCALC, TRIG, CHOLHDL, LDLDIRECT in the last 72 hours. Thyroid Function Tests: No results for input(s): TSH, T4TOTAL, FREET4, T3FREE, THYROIDAB in the last 72 hours. Anemia Panel: No results for input(s): VITAMINB12, FOLATE, FERRITIN, TIBC, IRON, RETICCTPCT in the last 72 hours. Urine analysis:    Component Value Date/Time   COLORURINE YELLOW 07/22/2021 0250   APPEARANCEUR CLOUDY (A) 07/22/2021 0250   LABSPEC 1.018 07/22/2021 0250   PHURINE 5.0 07/22/2021 0250   GLUCOSEU >=500 (A) 07/22/2021 0250   HGBUR SMALL (A) 07/22/2021 0250   BILIRUBINUR NEGATIVE 07/22/2021 0250   KETONESUR 5 (A) 07/22/2021 0250   PROTEINUR 30 (A) 07/22/2021 0250    UROBILINOGEN 0.2 09/13/2019 0906   NITRITE NEGATIVE 07/22/2021 0250   LEUKOCYTESUR TRACE (A) 07/22/2021 0250   No results found for this or any previous visit (from the past 240 hour(s)).    Radiology Studies: CT CHEST W CONTRAST  Result Date: 08/10/2021 CLINICAL DATA:  Pneumonia, effusion or abscess suspected. EXAM: CT CHEST WITH CONTRAST TECHNIQUE: Multidetector CT imaging of the chest was performed during intravenous contrast administration. CONTRAST:  44mL OMNIPAQUE IOHEXOL 300 MG/ML  SOLN COMPARISON:  MR thoracic spine August 07, 2021 FINDINGS: Cardiovascular: Calcific atherosclerotic disease of the coronary arteries. Normal heart size. No pericardial effusion. Mediastinum/Nodes: No enlarged mediastinal, hilar, or axillary lymph nodes. Thyroid gland, trachea, and esophagus demonstrate no significant findings. Lungs/Pleura: Moderate in size bilateral pleural effusions, measure water density. Bibasilar compressive atelectasis dependently. 3 mm perifissural soft tissue nodule in the superior segment of the right lower lobe. Upper Abdomen: Partially visualized hypoechoic mass in the posterior pole of the left kidney measures 3 cm Musculoskeletal: Sclerotic changes in T5, T6, T7, T8 and T9 vertebral bodies. Effacement of the intervening disc spaces. The paraspinal soft tissue thickening, and the posterior epidural fluid collection is better seen on the previous MRI. The effect of the spinal cord cannot be evaluated. IMPRESSION: 1. Moderate in size bilateral pleural effusions measure water density. 2. Bibasilar compressive atelectasis dependently. 3. 3 mm perifissural soft tissue nodule in the superior segment of the right lower lobe. This may represent a subpleural lymph node. 4. Partially visualized hypoechoic mass in the posterior pole of the left kidney measures 3 cm. Further evaluation with nonemergent renal ultrasound or contrast-enhanced MRI may be considered, when clinically feasible. 5. Sclerotic  changes in T5, T6, T7, T8 and T9 vertebral bodies. Effacement of the intervening disc spaces. The paraspinal soft tissue thickening, and the posterior epidural fluid collection is better seen on the previous MRI. The effect of the spinal cord cannot be evaluated. 6. Calcific atherosclerotic disease of the coronary arteries. 7. Aortic atherosclerosis. Aortic Atherosclerosis (ICD10-I70.0). Electronically Signed   By: Fidela Salisbury M.D.   On: 08/10/2021 10:51    Scheduled Meds:  acetaminophen  1,000 mg Oral TID   acyclovir  400 mg Oral TID   amiodarone  200 mg Oral Daily   vitamin C  1,000 mg Oral Daily   Chlorhexidine Gluconate Cloth  6 each Topical Daily   docusate sodium  100 mg Oral Daily   enoxaparin (LOVENOX) injection  105 mg Subcutaneous Once   enoxaparin (LOVENOX) injection  105 mg Subcutaneous Q12H   gabapentin  300 mg Oral TID   insulin aspart  0-15 Units Subcutaneous TID WC   insulin aspart  0-5 Units Subcutaneous QHS   insulin aspart  4 Units Subcutaneous TID WC   insulin detemir  28 Units Subcutaneous BID   isosorbide mononitrate  30 mg Oral Daily   lidocaine  2 patch Transdermal Q24H   methocarbamol  1,000 mg Oral TID   metoprolol succinate  50 mg Oral Daily   multivitamin with minerals  1 tablet Oral Daily   nutrition supplement (JUVEN)  1 packet Oral BID BM   oxyCODONE  15 mg Oral Q12H   polyethylene glycol  17 g Oral Daily   Ensure Max Protein  11 oz Oral BID BM   torsemide  20 mg Oral Daily   Continuous Infusions:  sodium chloride 10 mL/hr at 07/29/21 0816   vancomycin 750 mg (08/11/21 1211)     LOS: 20 days   Time spent: 35 minutes.  Geradine Girt, DO [facility] [email or web address] 08/11/2021, 2:14 PM

## 2021-08-11 NOTE — Progress Notes (Signed)
[name] for Infectious Disease  Date of Admission:  07/21/2021           Reason for visit: Follow up on epidural abscess  Current antibiotics: Acyclovir 12/2-present Vanc 12/1--present Cefepime 12/1--present   Previous antibiotics: PCN G 11/18-11/30 Clindamycin 11/14-11/15 Ceftriaxone 11/14-11/17 Zosyn 11/14-11/16 Vanc x1 dose 11/14   ASSESSMENT:    55 y.o. male admitted with:  Group B strep bacteremia:  repeat MRI done 12/1 showed significant progression of discitis/OM at T5-T8 and extensive posterior epidural abscess extending from T3-T9 with mass effect on the cord and moderate spinal stenosis T4-T8. TEE done earlier this admission negative for vegetations and blood cultures cleared 07/24/21 Epidural abscess: worsened on follow up MRI as noted above DFU/OM s/p left BKA 07/23/21 Bilateral pleural effusions:  Attempted thoracentesis 12/2 but patient unable to lay in position to have done.  Per notes, IR said will need to be done under anesthesia. Uncontrolled DM: A1c is 13.8 Hx of right BKA AKI: creatinine peaked at 3.29.  Down to 1.12 today.  RECOMMENDATIONS:    Patient is quite fixed in his belief that vancomycin is the appropriate antibiotic for this infection.  He remains upset that he was on penicillin as he feels this is a weak antibiotic used to treat "STI's" or "VD".   Attempted to convey to patient that the antibiotic of choice for GBS is penicillin and that vancomycin would not typically be recommended in this setting.  Suspect his worsening findings is due to poorly controlled diabetes and lack of source control for his infection.  He nonetheless does not want to consider surgery and states he is not going to "play [place] roulette" by doing so. I would recommend penicillin, however, he is unable to accept this recommendation.  Will therefore continue with vancomycin.  Can stop cefepime as CT chest not significant for pneumonia Ideally would have  thoracentesis for pleural effusions Will need close monitoring of neuro exam and prolonged antibiotic course.  I think management of this type of infection with antibiotics alone has a high risk of failure and progression.   Will follow.   Principal Problem:   Discitis of thoracic region Active Problems:   Essential hypertension   Stage 3a chronic kidney disease (HCC)   Type 2 diabetes mellitus with diabetic polyneuropathy, with long-term current use of insulin (HCC)   Right below-knee amputee (HCC)   Diabetic infection of left foot (HCC)   Chronic diastolic (congestive) heart failure (HCC)   Severe sepsis with acute organ dysfunction (HCC)   Chest pain   AKI (acute kidney injury) ([place])   Gas gangrene of foot (HCC)   Atrial flutter (HCC)   Septic shock (HCC)   Systolic dysfunction   Pressure injury of skin   MSSA bacteremia    MEDICATIONS:    Scheduled Meds: . acetaminophen  1,000 mg Oral TID  . acyclovir  400 mg Oral TID  . amiodarone  200 mg Oral Daily  . vitamin C  1,000 mg Oral Daily  . Chlorhexidine Gluconate Cloth  6 each Topical Daily  . docusate sodium  100 mg Oral Daily  . gabapentin  300 mg Oral TID  . insulin aspart  0-15 Units Subcutaneous TID WC  . insulin aspart  0-5 Units Subcutaneous QHS  . insulin aspart  4 Units Subcutaneous TID WC  . insulin detemir  28 Units Subcutaneous BID  . isosorbide mononitrate  30 mg Oral Daily  . lidocaine  2 patch  Transdermal Q24H  . methocarbamol  1,000 mg Oral TID  . metoprolol succinate  50 mg Oral Daily  . multivitamin with minerals  1 tablet Oral Daily  . nutrition supplement (JUVEN)  1 packet Oral BID BM  . oxyCODONE  15 mg Oral Q12H  . polyethylene glycol  17 g Oral Daily  . Ensure Max Protein  11 oz Oral BID BM  . torsemide  20 mg Oral Daily   Continuous Infusions: . sodium chloride 10 mL/hr at 07/29/21 0816  . magnesium sulfate bolus IVPB 2 g (08/11/21 1043)  . vancomycin 750 mg (08/11/21 0300)   PRN  Meds:.alum & mag hydroxide-simeth, bisacodyl, diphenhydrAMINE, guaiFENesin-dextromethorphan, HYDROmorphone (DILAUDID) injection, liver oil-zinc oxide, ondansetron, oxyCODONE, phenol, polyethylene glycol, traZODone  SUBJECTIVE:   24 hour events:  No acute events  Remains upset about penicillin Tolerating vancomycin Does not want to see Dr Candiss Norse again Frustrated that he has not received a PICC line. Does not want to consider surgery.    Review of Systems  All other systems reviewed and are negative.    OBJECTIVE:   Blood pressure (!) 156/78, pulse 65, temperature 97.9 F (36.6 C), temperature source Oral, resp. rate 15, height 6\' 3"  (1.905 m), weight 107 kg, SpO2 95 %. Body mass index is 29.48 kg/m.  Physical Exam Constitutional:      General: He is not in acute distress.    Appearance: Normal appearance.  HENT:     Head: Normocephalic and atraumatic.     Mouth/Throat:     Mouth: Mucous membranes are moist.     Pharynx: Oropharynx is clear.  Eyes:     Extraocular Movements: Extraocular movements intact.     Conjunctiva/sclera: Conjunctivae normal.  Pulmonary:     Effort: Pulmonary effort is normal. No respiratory distress.  Musculoskeletal:     Cervical back: Normal range of motion and neck supple.     Comments: Moving upper extremities without issues. Moving lower extremities.  S/p bilateral BKA.  Skin:    General: Skin is warm and dry.     Findings: No rash.  Neurological:     General: No focal deficit present.     Mental Status: He is alert and oriented to person, place, and time.     Lab Results: Lab Results  Component Value Date   WBC 6.6 08/11/2021   HGB 12.9 (L) 08/11/2021   HCT 40.1 08/11/2021   MCV 90.3 08/11/2021   PLT 301 08/11/2021    Lab Results  Component Value Date   NA 133 (L) 08/11/2021   K 4.2 08/11/2021   CO2 24 08/11/2021   GLUCOSE 181 (H) 08/11/2021   BUN 25 (H) 08/11/2021   CREATININE 1.12 08/11/2021   CALCIUM 8.1 (L)  08/11/2021   GFRNONAA >60 08/11/2021   GFRAA 57 (L) 09/13/2019    Lab Results  Component Value Date   ALT 33 08/02/2021   AST 33 08/02/2021   ALKPHOS 77 08/02/2021   BILITOT 0.7 08/02/2021       Component Value Date/Time   CRP 23.2 (H) 07/21/2021 2033       Component Value Date/Time   ESRSEDRATE 20 (H) 07/21/2021 2033     I have reviewed the micro and lab results in Epic.  Imaging: CT CHEST W CONTRAST  Result Date: 08/10/2021 CLINICAL DATA:  Pneumonia, effusion or abscess suspected. EXAM: CT CHEST WITH CONTRAST TECHNIQUE: Multidetector CT imaging of the chest was performed during intravenous contrast administration. CONTRAST:  32mL OMNIPAQUE IOHEXOL  300 MG/ML  SOLN COMPARISON:  MR thoracic spine August 07, 2021 FINDINGS: Cardiovascular: Calcific atherosclerotic disease of the coronary arteries. Normal heart size. No pericardial effusion. Mediastinum/Nodes: No enlarged mediastinal, hilar, or axillary lymph nodes. Thyroid gland, trachea, and esophagus demonstrate no significant findings. Lungs/Pleura: Moderate in size bilateral pleural effusions, measure water density. Bibasilar compressive atelectasis dependently. 3 mm perifissural soft tissue nodule in the superior segment of the right lower lobe. Upper Abdomen: Partially visualized hypoechoic mass in the posterior pole of the left kidney measures 3 cm Musculoskeletal: Sclerotic changes in T5, T6, T7, T8 and T9 vertebral bodies. Effacement of the intervening disc spaces. The paraspinal soft tissue thickening, and the posterior epidural fluid collection is better seen on the previous MRI. The effect of the spinal cord cannot be evaluated. IMPRESSION: 1. Moderate in size bilateral pleural effusions measure water density. 2. Bibasilar compressive atelectasis dependently. 3. 3 mm perifissural soft tissue nodule in the superior segment of the right lower lobe. This may represent a subpleural lymph node. 4. Partially visualized hypoechoic mass  in the posterior pole of the left kidney measures 3 cm. Further evaluation with nonemergent renal ultrasound or contrast-enhanced MRI may be considered, when clinically feasible. 5. Sclerotic changes in T5, T6, T7, T8 and T9 vertebral bodies. Effacement of the intervening disc spaces. The paraspinal soft tissue thickening, and the posterior epidural fluid collection is better seen on the previous MRI. The effect of the spinal cord cannot be evaluated. 6. Calcific atherosclerotic disease of the coronary arteries. 7. Aortic atherosclerosis. Aortic Atherosclerosis (ICD10-I70.0). Electronically Signed   By: Fidela Salisbury M.D.   On: 08/10/2021 10:51     Imaging independently reviewed in Epic.    Raynelle Highland for Infectious Disease [facility] [phone] pager 08/11/2021, 11:01 AM  I spent greater than 35 minutes with the patient including greater than 50% of time in face to face counsel of the patient and in coordination of their care.

## 2021-08-11 NOTE — Progress Notes (Signed)
ANTICOAGULATION CONSULT NOTE  Pharmacy Consult:  Heparin>>Lovenox Indication: atrial fibrillation  Allergies  Allergen Reactions   Bactrim [Sulfamethoxazole-Trimethoprim]    Ceprotin [Protein C Concentrate (Human)]    Ciprofloxacin Other (See Comments)    Kidney function   Levaquin [Levofloxacin]     Patient Measurements: Height: 6\' 3"  (190.5 cm) Weight: 107 kg (235 lb 14.3 oz) IBW/kg (Calculated) : 84.5 Heparin Dosing Weight: 106 kg  Vital Signs: Temp: 97.9 F (36.6 C) (12/05 0317) Temp Source: Oral (12/05 0317) BP: 156/78 (12/05 0806) Pulse Rate: 65 (12/05 0806)  Labs: Recent Labs    08/08/21 2158 08/09/21 0250 08/09/21 0250 08/09/21 0943 08/10/21 0408 08/11/21 0301  HGB  --  12.3*   < >  --  12.4* 12.9*  HCT  --  39.2  --   --  40.0 40.1  PLT  --  328  --   --  328 301  APTT 57*  --   --  52*  --   --   HEPARINUNFRC  --   --   --  0.42  --   --   CREATININE  --  1.21  --   --  1.15 1.12   < > = values in this interval not displayed.     Estimated Creatinine Clearance: 98.6 mL/min (by C-G formula based on SCr of 1.12 mg/dL).  Assessment: 55 YOM history of on Eliquis, last dose 12/1 around 0830.  MRI showed loculated pleural effusions abscess with progression of discitis/osteomyelitis.  Pharmacy consulted to transition patient to IV heparin due to need for surgery/procedures.  CBC stable; no bleeding reported.  Patient was previously sub-therapeutic on heparin at 1700 units/hr.  Enoxaparin briefly held in case procedures needed, none for now so will resume with two doses today given recent DCCV.  Goal of Therapy:  Anti-Xa 0.6-1 Heparin level 0.3-0.7 units/mL aPTT 66-102 seconds Monitor platelets by anticoagulation protocol: Yes   Plan:  Resume enoxaparin 1mg /kg SQ 12h  Arrie Senate, PharmD, BCPS, [facility] Clinical Pharmacist Please check AMION for all [place] numbers 08/11/2021

## 2021-08-11 NOTE — Progress Notes (Signed)
He states he is doing much better.  He does have some low back pain and left hip pain but he states he is moving his legs well.  His pain is somewhat better.  He does look good on exam today.  He seems to move his legs well to an in bed exam.  He looks nontoxic.  He is in good spirits today.  At this point we have no plans to operate on the osteomyelitis discitis epidural abscess.  He is doing well neurologically and now clinically looks quite good.  Continue antibiotics per infectious disease.  Continue to try to mobilize as tolerated.  Continue pain control.  Reviewed his CT scan of the chest and at this point the osteomyelitis is not causing bony destruction, as that would be my greatest fear at this point as he would develop progressive kyphosis from bony destruction and would need a posterior stabilization procedure.  So far, that is not a problem.

## 2021-08-11 NOTE — Progress Notes (Signed)
PT Cancellation Note  Patient Details Name: [name] MRN: [id number] DOB: June 25, 1966   Cancelled Treatment:    Reason Eval/Treat Not Completed: Pain limiting ability to participate;Fatigue/lethargy limiting ability to participate;Other (comment). Pt has declined PT two times today, first for pain to get OOB and wanted an hour to finish lunch, then just declined as he reports again not able to get up and has already been doing exercises.  Follow up at another time.   Ramond Dial 08/11/2021, 4:27 PM  Mee Hives, PT PhD Acute Rehab Dept. Number: [place]

## 2021-08-11 NOTE — Progress Notes (Signed)
OT Cancellation Note  Patient Details Name: [name] MRN: [id number] DOB: 09-24-1965   Cancelled Treatment:    Reason Eval/Treat Not Completed: Other (comment)- pt eating lunch and requests OT to return later.  Will follow and see as able.   Jolaine Artist, OT Acute Rehabilitation Services Pager [phone] Office [phone]   Delight Stare 08/11/2021, 12:01 PM

## 2021-08-11 NOTE — Care Management Important Message (Signed)
Important Message  Patient Details  Name: [name] MRN: [id number] Date of Birth: 09/26/1965   Medicare Important Message Given:  Yes     Shelda Altes 08/11/2021, 11:08 AM

## 2021-08-12 DIAGNOSIS — E11628 Type 2 diabetes mellitus with other skin complications: Secondary | ICD-10-CM | POA: Diagnosis not present

## 2021-08-12 DIAGNOSIS — L89300 Pressure ulcer of unspecified buttock, unstageable: Secondary | ICD-10-CM | POA: Diagnosis not present

## 2021-08-12 DIAGNOSIS — E1142 Type 2 diabetes mellitus with diabetic polyneuropathy: Secondary | ICD-10-CM | POA: Diagnosis not present

## 2021-08-12 DIAGNOSIS — N179 Acute kidney failure, unspecified: Secondary | ICD-10-CM | POA: Diagnosis not present

## 2021-08-12 DIAGNOSIS — Z89511 Acquired absence of right leg below knee: Secondary | ICD-10-CM | POA: Diagnosis not present

## 2021-08-12 DIAGNOSIS — L089 Local infection of the skin and subcutaneous tissue, unspecified: Secondary | ICD-10-CM | POA: Diagnosis not present

## 2021-08-12 DIAGNOSIS — Z794 Long term (current) use of insulin: Secondary | ICD-10-CM | POA: Diagnosis not present

## 2021-08-12 DIAGNOSIS — M4644 Discitis, unspecified, thoracic region: Secondary | ICD-10-CM | POA: Diagnosis not present

## 2021-08-12 LAB — HSV DNA BY PCR (REFERENCE LAB)
HSV 1 DNA: NEGATIVE
HSV 2 DNA: POSITIVE — AB

## 2021-08-12 LAB — BASIC METABOLIC PANEL
Anion gap: 9 (ref 5–15)
BUN: 32 mg/dL — ABNORMAL HIGH (ref 6–20)
CO2: 24 mmol/L (ref 22–32)
Calcium: 8.3 mg/dL — ABNORMAL LOW (ref 8.9–10.3)
Chloride: 99 mmol/L (ref 98–111)
Creatinine, Ser: 1.35 mg/dL — ABNORMAL HIGH (ref 0.61–1.24)
GFR, Estimated: >60 mL/min (ref >60)
Glucose, Bld: 224 mg/dL — ABNORMAL HIGH (ref 70–99)
Potassium: 4.1 mmol/L (ref 3.5–5.1)
Sodium: 132 mmol/L — ABNORMAL LOW (ref 135–145)

## 2021-08-12 LAB — CBC
HCT: 41.2 % (ref 39.0–52.0)
Hemoglobin: 13.2 g/dL (ref 13.0–17.0)
MCH: 29 pg (ref 26.0–34.0)
MCHC: 32 g/dL (ref 30.0–36.0)
MCV: 90.5 fL (ref 80.0–100.0)
Platelets: 319 10*3/uL (ref 150–400)
RBC: 4.55 MIL/uL (ref 4.22–5.81)
RDW: 14.9 % (ref 11.5–15.5)
WBC: 6.6 10*3/uL (ref 4.0–10.5)
nRBC: 0 % (ref 0.0–0.2)

## 2021-08-12 LAB — MAGNESIUM: Magnesium: 1.7 mg/dL (ref 1.7–2.4)

## 2021-08-12 LAB — GLUCOSE, CAPILLARY
Glucose-Capillary: 239 mg/dL — ABNORMAL HIGH (ref 70–99)
Glucose-Capillary: 210 mg/dL — ABNORMAL HIGH (ref 70–99)
Glucose-Capillary: 240 mg/dL — ABNORMAL HIGH (ref 70–99)
Glucose-Capillary: 244 mg/dL — ABNORMAL HIGH (ref 70–99)

## 2021-08-12 MED ORDER — MAGNESIUM SULFATE 2 GM/50ML IV SOLN
2.0000 g | Freq: Once | INTRAVENOUS | Status: AC
  Administered 2021-08-12: 2 g via INTRAVENOUS
  Filled 2021-08-12: qty 50

## 2021-08-12 MED ORDER — TORSEMIDE 20 MG PO TABS
20.0000 mg | ORAL_TABLET | ORAL | Status: DC
  Administered 2021-08-13 – 2021-08-31 (×10): 20 mg via ORAL
  Filled 2021-08-12 (×13): qty 1

## 2021-08-12 MED ORDER — COLLAGENASE 250 UNIT/GM EX OINT
TOPICAL_OINTMENT | Freq: Every day | CUTANEOUS | Status: AC
  Administered 2021-08-12: 1 via TOPICAL
  Filled 2021-08-12 (×3): qty 30

## 2021-08-12 NOTE — Consult Note (Signed)
[facility] Nurse Consult Note: Reason for Consult: requested to see patient for acute change in sacral wound Wound type: evolved pressure injury; now Unstageable  Pressure Injury POA: No Measurement:5cm x 9cm x 0.1cm  Wound bed:70% brown/grey non viable tissue/ 30% pink Drainage (amount, consistency, odor) minimal; serosanguinous  Periwound: intact; some superficial skin peeling from moisture  Dressing procedure/placement/frequency: Add enzymatic debridement ointment to clear necrotic tissue Add air mattress now that patient transferred from ICU; LALM will aid in pressure redistribution and moisture management Add PT for hydrotherapy evaluation; explained rationale for use of the treatment to the patient Air mattress arrived while I was on the unit, patient to be aware trapeze not available for air mattress bed  RD follow up 08/08/21; using Juven for wound healing; Glucerna and Ensure Max for protein supplementation.  Will discuss need for pressure relief if the patient to get up in the chair, add pressure redistribution pad if needed [place] Nurse team will follow with you and see patient within 10 days for wound assessments.  Please notify [facility] nurses of any acute changes in the wounds or any new areas of concern [place] MSN, [place], [place], [place]

## 2021-08-12 NOTE — Progress Notes (Signed)
Inpatient Rehab Admissions Coordinator:   Continue to follow.  Note no plans for neurosurgical intervention at this time and plans for long term antibiotics.  Pt not able or willing to participate with therapy outside of bed level therex.  Unless he is able to participate with therapy in out of bed activities then he would not likely be a candidate for CIR at this time. Will follow for one more therapy session to assess.   Shann Medal, PT, DPT Admissions Coordinator [phone] 08/12/21  3:05 PM

## 2021-08-12 NOTE — Progress Notes (Signed)
Orthopedic Tech Progress Note Patient Details:  [name] [birth year] [id number]  I was called to come and switch [place] to new new bed with air mattress, but I told them that new bed isn't compatible for frame and that I never had an order for the frame from the beginning.  [name], male   DOB: 12/14/1965, 55 y.o.   MRN: [id number]  Janit Pagan 08/12/2021, 9:12 AM

## 2021-08-12 NOTE — Progress Notes (Signed)
OT Cancellation Note  Patient Details Name: [name] MRN: [id number] DOB: May 04, 1966   Cancelled Treatment:    Reason Eval/Treat Not Completed: Other needing to have bed changed and dressing changed.  Will reattempt.  [name]., OTR/L Acute Rehabilitation Services Pager [phone] Office [phone]   [name] M 08/12/2021, 4:43 PM

## 2021-08-12 NOTE — Progress Notes (Signed)
[facility] for Infectious Disease  Date of Admission:  07/21/2021           Reason for visit: Follow up on epidural abscess, GBS bacteremia  Current antibiotics: Acyclovir 12/2-present Vanc 12/1--present      Previous antibiotics: Cefepime 12/1--12/5 PCN G 11/18-11/30 Clindamycin 11/14-11/15 Ceftriaxone 11/14-11/17 Zosyn 11/14-11/16 Vanc x1 dose 11/14  ASSESSMENT:    55 y.o. male admitted with:  Group B strep bacteremia: complicated by DFU/OM s/p left BKA 07/23/21 and epidural abscess.  Spinal disease showed significant progression of discitis/OM at T5-T8 and extensive posterior epidural abscess extending from T3-T9 with mass effect on the cord and moderate spinal stenosis T4-T8 on repeat imaging done 08/07/21.  Patient currently feels improved and no plans for neurosurgical intervention at the moment.  He underwent TEE this admission that was negative for endocarditis and blood culture cleared as of 07/24/21. Sacral wound: evaluated by WOC today. Bilateral pleural effusions:  attempted thoracentesis 12/2 but patient unable to lay in position to have done.  Per notes, will re-attempt now that his pain is improved. Uncontrolled DM: A1c is 13.8. Hx of prior right BKA. AKI: creatinine peaked at 3.29.  Creatinine today is 1.35.  RECOMMENDATIONS:    Patient remains fixed in belief that vancomycin is the most appropriate antibiotic for this infection.  Discussed with patient that recommended antibiotic of choice for GBS is penicillin but he prefers to continue with vancomycin. Follow up plans for thoracentesis to sample pleural effusions Apppreciate WOC recs for sacral wound.  Does not look infected currently Close monitoring of neuro exam Will need prolonged antibiotic course.  Would be okay to place PICC line Will follow.    Principal Problem:   Discitis of thoracic region Active Problems:   Essential hypertension   Stage 3a chronic kidney disease (HCC)   Type 2  diabetes mellitus with diabetic polyneuropathy, with long-term current use of insulin (HCC)   Right below-knee amputee (HCC)   Diabetic infection of left foot (HCC)   Chronic diastolic (congestive) heart failure (HCC)   Severe sepsis with acute organ dysfunction (HCC)   Chest pain   AKI (acute kidney injury) ([place])   Gas gangrene of foot (HCC)   Atrial flutter (HCC)   Septic shock (HCC)   Systolic dysfunction   Pressure injury of skin   MSSA bacteremia    MEDICATIONS:    Scheduled Meds: . acetaminophen  1,000 mg Oral TID  . acyclovir  400 mg Oral TID  . amiodarone  200 mg Oral Daily  . vitamin C  1,000 mg Oral Daily  . Chlorhexidine Gluconate Cloth  6 each Topical Daily  . docusate sodium  100 mg Oral Daily  . enoxaparin (LOVENOX) injection  105 mg Subcutaneous Q12H  . gabapentin  300 mg Oral TID  . insulin aspart  0-15 Units Subcutaneous TID WC  . insulin aspart  0-5 Units Subcutaneous QHS  . insulin aspart  6 Units Subcutaneous TID WC  . insulin detemir  30 Units Subcutaneous BID  . isosorbide mononitrate  30 mg Oral Daily  . lidocaine  2 patch Transdermal Q24H  . methocarbamol  1,000 mg Oral Q8H  . metoprolol succinate  50 mg Oral Daily  . multivitamin with minerals  1 tablet Oral Daily  . nutrition supplement (JUVEN)  1 packet Oral BID BM  . oxyCODONE  15 mg Oral Q12H  . polyethylene glycol  17 g Oral Daily  . Ensure Max Protein  11  oz Oral BID BM  . torsemide  20 mg Oral Daily   Continuous Infusions: . sodium chloride 10 mL/hr at 07/29/21 0816  . vancomycin 750 mg (08/12/21 0154)   PRN Meds:.alum & mag hydroxide-simeth, bisacodyl, diphenhydrAMINE, guaiFENesin-dextromethorphan, HYDROmorphone (DILAUDID) injection, liver oil-zinc oxide, ondansetron, oxyCODONE, phenol, polyethylene glycol, traZODone  SUBJECTIVE:   24 hour events:  No acute events Afebrile WBC 6.6 Creat 1.35 Declining to work with PT/OT   Seen today with [facility].  Reports skin breakdown on  backside after sitting in his urine overnight last week.  Has low back pain.  Upper back pain is better.  No fevers, chills.  Happy to be on vancomycin.  Review of Systems  All other systems reviewed and are negative.    OBJECTIVE:   Blood pressure 116/67, pulse 61, temperature 98.6 F (37 C), temperature source Oral, resp. rate 20, height 6\' 3"  (1.905 m), weight 107 kg, SpO2 98 %. Body mass index is 29.48 kg/m.  Physical Exam Constitutional:      General: He is not in acute distress.    Appearance: Normal appearance.  HENT:     Head: Normocephalic and atraumatic.  Pulmonary:     Effort: Pulmonary effort is normal. No respiratory distress.  Abdominal:     General: There is no distension.  Musculoskeletal:     Comments: S/p bilateral BKA. Moving upper extremities well.  Good strength.   See image of sacral wound.   Skin:    General: Skin is warm and dry.     Findings: No rash.  Neurological:     General: No focal deficit present.     Mental Status: He is alert and oriented to person, place, and time.      Lab Results: Lab Results  Component Value Date   WBC 6.6 08/12/2021   HGB 13.2 08/12/2021   HCT 41.2 08/12/2021   MCV 90.5 08/12/2021   PLT 319 08/12/2021    Lab Results  Component Value Date   NA 132 (L) 08/12/2021   K 4.1 08/12/2021   CO2 24 08/12/2021   GLUCOSE 224 (H) 08/12/2021   BUN 32 (H) 08/12/2021   CREATININE 1.35 (H) 08/12/2021   CALCIUM 8.3 (L) 08/12/2021   GFRNONAA >60 08/12/2021   GFRAA 57 (L) 09/13/2019    Lab Results  Component Value Date   ALT 33 08/02/2021   AST 33 08/02/2021   ALKPHOS 77 08/02/2021   BILITOT 0.7 08/02/2021       Component Value Date/Time   CRP 23.2 (H) 07/21/2021 2033       Component Value Date/Time   ESRSEDRATE 20 (H) 07/21/2021 2033     I have reviewed the micro and lab results in Epic.  Imaging: CT CHEST W CONTRAST  Result Date: 08/10/2021 CLINICAL DATA:  Pneumonia, effusion or abscess suspected.  EXAM: CT CHEST WITH CONTRAST TECHNIQUE: Multidetector CT imaging of the chest was performed during intravenous contrast administration. CONTRAST:  3mL OMNIPAQUE IOHEXOL 300 MG/ML  SOLN COMPARISON:  MR thoracic spine August 07, 2021 FINDINGS: Cardiovascular: Calcific atherosclerotic disease of the coronary arteries. Normal heart size. No pericardial effusion. Mediastinum/Nodes: No enlarged mediastinal, hilar, or axillary lymph nodes. Thyroid gland, trachea, and esophagus demonstrate no significant findings. Lungs/Pleura: Moderate in size bilateral pleural effusions, measure water density. Bibasilar compressive atelectasis dependently. 3 mm perifissural soft tissue nodule in the superior segment of the right lower lobe. Upper Abdomen: Partially visualized hypoechoic mass in the posterior pole of the left kidney measures 3  cm Musculoskeletal: Sclerotic changes in T5, T6, T7, T8 and T9 vertebral bodies. Effacement of the intervening disc spaces. The paraspinal soft tissue thickening, and the posterior epidural fluid collection is better seen on the previous MRI. The effect of the spinal cord cannot be evaluated. IMPRESSION: 1. Moderate in size bilateral pleural effusions measure water density. 2. Bibasilar compressive atelectasis dependently. 3. 3 mm perifissural soft tissue nodule in the superior segment of the right lower lobe. This may represent a subpleural lymph node. 4. Partially visualized hypoechoic mass in the posterior pole of the left kidney measures 3 cm. Further evaluation with nonemergent renal ultrasound or contrast-enhanced MRI may be considered, when clinically feasible. 5. Sclerotic changes in T5, T6, T7, T8 and T9 vertebral bodies. Effacement of the intervening disc spaces. The paraspinal soft tissue thickening, and the posterior epidural fluid collection is better seen on the previous MRI. The effect of the spinal cord cannot be evaluated. 6. Calcific atherosclerotic disease of the coronary  arteries. 7. Aortic atherosclerosis. Aortic Atherosclerosis (ICD10-I70.0). Electronically Signed   By: Fidela Salisbury M.D.   On: 08/10/2021 10:51     Imaging independently reviewed in Epic.    Raynelle Highland for Infectious Disease [facility] [phone] pager 08/12/2021, 8:29 AM  I spent greater than 35 minutes with the patient including greater than 50% of time in face to face counsel of the patient and in coordination of their care.

## 2021-08-12 NOTE — Progress Notes (Signed)
Pharmacy Antibiotic Note  [name] is a 55 y.o. male admitted on 07/21/2021 with group B bacteremia and epidural abscess.  Pharmacy has been consulted for vancomycin and dosing.  ID following and plans for a prolonged antibiotic course -WBC= 6.6, afebrile, SCr= 1.1>1.35   Plan: Continue vancomycin 750 mg q 8 hrs. Will check vancomycin levels  on Friday   Height: 6\' 3"  (190.5 cm) Weight: 107 kg (235 lb 14.3 oz) IBW/kg (Calculated) : 84.5  Temp (24hrs), Avg:98.4 F (36.9 C), Min:97.9 F (36.6 C), Max:98.6 F (37 C)  Recent Labs  Lab 08/08/21 0204 08/09/21 0250 08/09/21 1215 08/09/21 2014 08/10/21 0408 08/11/21 0301 08/12/21 0052  WBC 7.9 7.4  --   --  6.2 6.6 6.6  CREATININE 1.54* 1.21  --   --  1.15 1.12 1.38*  VANCOTROUGH  --   --   --  12*  --   --   --   VANCOPEAK  --   --  24*  --   --   --   --      Estimated Creatinine Clearance: 81.8 mL/min (A) (by C-G formula based on SCr of 1.35 mg/dL (H)).    Allergies  Allergen Reactions   Bactrim [Sulfamethoxazole-Trimethoprim]    Ceprotin [Protein C Concentrate (Human)]    Ciprofloxacin Other (See Comments)    Kidney function   Levaquin [Levofloxacin]     11/14 Ceftriaxone x1 11/14 Vancomycin >> 11/15, 12/1>> 12/1 Cefepime>> 11/15 Zosyn >> 11/16 11/15 Clindamycin >>11/15 11/15 Linezolid>> 11/16 11/16 Rocephin>>11/18 11/18 Penicillin G>>12/1 by ID (2 weeks IV therapy, then 4-6 weeks amox until 09/14/21) 12/2 acyclovir for 7 days   11/17 BCx: ngF 11/16 MRS PCR + 11/14 BCx: GBS pan S 11/15 UCx: GBS   Thank you for allowing pharmacy to be a part of this patient's care.  Hildred Laser, PharmD Clinical Pharmacist **Pharmacist phone directory can now be found on [email or web address] ([email or web address]).  Listed under [place].

## 2021-08-12 NOTE — Progress Notes (Addendum)
PROGRESS NOTE  [name]  SVX:[id number] DOB: 04/21/1966 DOA: 07/21/2021 PCP: Patient, No Pcp Per (Inactive)   Brief Narrative: 55 year old male with history of diabetes mellitus type 2, diabetic foot infection with prior right BKA, chronic diastolic CHF, hypertension, dyslipidemia presented with worsening left foot swelling/discoloration and discharge.   -went to ICU: underwent left transtibial amputation on 07/23/2021.  -found to have strep agalactiae bacteremia.  -found to have vertebral discitis versus osteomyelitis on MRI.   -found to have new systolic dysfunction with LVEF of 35% along with new onset atrial flutter requiring IV amiodarone which has been switched to oral.  Cardiology was consulted.  He has been started on Eliquis.  S/p TEE followed by DCCV on 11/22.   -Developed worsening back pain 11/26-11/27, ID reconsulted, requested MRI T/L-spine with contrast. This requires general anesthesia and was performed 11/29. Unfortunately diagnostic images could not be obtained.  Repeat MRI done 12/1 and shows: Significant progression of discitis and osteomyelitis at T5 through T8. There is edema extending into the posterior elements. Extensive posterior epidural abscess has progressed since the prior study. Abscess extends from T3 through T9 and is exerting mass-effect on the cord with moderate spinal stenosis T4 through T8.  Interval development of moderately large bilateral loculated pleural effusions. Possible empyema versus reactive fluid. Seen by NS and no plan for surgery- continue abx.   Attempted thoracentesis on 12/2 but patient not able to lay in a position to get procedure done.  AS pain is better controlled with re-try thoracentesis today or tomm without anesthesia.     Assessment & Plan: Principal Problem:   Discitis of thoracic region Active Problems:   Essential hypertension   Stage 3a chronic kidney disease (HCC)   Type 2 diabetes mellitus with diabetic polyneuropathy,  with long-term current use of insulin (HCC)   Right below-knee amputee (HCC)   Diabetic infection of left foot (HCC)   Chronic diastolic (congestive) heart failure (HCC)   Severe sepsis with acute organ dysfunction (HCC)   Chest pain   AKI (acute kidney injury) ([place])   Gas gangrene of foot (HCC)   Atrial flutter (HCC)   Septic shock (HCC)   Systolic dysfunction   Pressure injury of skin   MSSA bacteremia   Septic shock: Evolving on admission from bacteremia/diabetic foot infection -Shock resolved. -Required care in ICU under PCCM service: Care has been transferred to [facility] on 07/27/2021   Diabetic foot infection, left: Status post left transtibial amputation on 07/23/2021. - Wound/wound VAC care as per Dr. Sharol Given.  -12/6 vac leaking: Dr. Sharol Given to write orders to remove the wound VAC apply a dry dressing and will see the patient tomorrow morning.   Group B streptococcus bacteremia, worsening thoracic discitis/osteomyelitis.  -No vegetation on TTE or TEE during cardioversion.   -MRI 12/1: 1. Significant progression of discitis and osteomyelitis at T5 through T8. There is edema extending into the posterior elements. 2. Extensive posterior epidural abscess has progressed since the prior study. Abscess extends from T3 through T9 and is exerting mass-effect on the cord with moderate spinal stenosis T4 through T8 3. Interval development of moderately large bilateral loculated pleural effusions. Possible empyema versus reactive fluid. - Neurosurgery re-consulted on 12/1- no plans for surgery, continue IV abx -patient moving both legs -ID notified of MRI findings and abx changed on 12/1: vancomycin - appreciated recommendations from ID -blood cultures from 07/24/2021 were negative   AKI on stage IIIa CKD: AKI resolving - have adjusted diuretics -Cr stable -daily  labs  Pt takes testosterone (not prescription)/anabolic steroids:  - total is low -free low -reviewed literature-- needs 2-3  low results prior to replacement -repeated and pending 12/5   Hyponatremia:  -stable  Pleural effusion/?loculation -IR consult for thoracentesis/chest tube- was not able to do on 12/2 as he was not able to lay flat -culture with procedure -CT scan of lungs 12/4: fluid but no abscess - IR to try again without anesthesia as he is more comfortable now- either today or tomm   New onset systolic CHF: Echo showed EF of 35% (previous echo showed EF of 50 to 55% in 04/2021). Cardiomyopathy may be from new onset atrial flutter RVR related tachycardia versus stress induced cardiomyopathy from bacteremia and septic shock. - per cards (signed off 11/30): BB, Hydralazine/Imdur, Torsemide (outpatient diuretic was lasix 40 mg PO daily) -resume demadax 12/5 QOD  New onset atrial flutter: Maintained NSR since DCCV 11/22 - Continue metoprolol succinate 50 Mg daily. - Continue amiodarone 200mg  twice daily x7 days and then 200 Mg daily. - spoke with cards: Ideally, patients remain on anticoagulation ~ 30 days after DCCV in order to decrease stroke risk.  Given is need for significant surgery and procedures and that he would have significant ricks if these were not down, the risk vs benefits would favor heparin and stopping as need per surgery and proceduralists for these infection control procedures.  He has been controled in rhythm with amiodarone loading and maintenance. - DOAC-> lovenox (held heparin as having difficulty getting therapeutic)- once no further procedure will change back to DOAC   Hypomagnesemia:  - replete   Leukocytosis: resolved   T2DM uncontrolled with hyperglycemia:  - Increased insulin as able for better coverage  Thrombocytopenia - Resolved   Generalized deconditioning - PT recommends CIR which will be pursued once work up has been completed.    Acute on chronic pain: Follows with Dr. Nelva Bush, pain management. - Reviewed [place] PDMP and patient regularly fills opioid prescriptions.  Likely opioid-dependent. - Obviously has reasons for acute pain from recent surgery, discitis/osteomyelitis. - Evaluating etiology for worsening back pain as noted above. Patient on following regimen after making changes: - Tylenol 1 g 3 times daily. - Oxy IR 10 mg every 4 hourly as needed for moderate pain - OxyContin 15 Mg every 12 hours - Lidocaine patch increased to 2 patches - Dilaudid 0.5-1 mg IV every 4 hourly as needed severe pain - Gabapentin 300 mg 3 times daily  -12/5: discussed with Dr. Nelva Bush per patient request the nerve block he is scheduled to undergo--he would not be able to get this until all abx complete and infection treated. 12/5 added robaxin with reported improvement  Insomnia - Started trazodone 50 Mg at bedtime as needed   RN Pressure Injury Documentation: Pressure Injury 07/27/21 Sacrum Mid Stage 2 -  Partial thickness loss of dermis presenting as a shallow open injury with a red, pink wound bed without slough. small pink pressure ulcer with break in skin (Active)  07/27/21 0255  Location: Sacrum  Location Orientation: Mid  Staging: Stage 2 -  Partial thickness loss of dermis presenting as a shallow open injury with a red, pink wound bed without slough.  Wound Description (Comments): small pink pressure ulcer with break in skin  Present on Admission: Yes  -reconsulted wound care  Penile ulcers/wounds -HSV + for herpes- treat with acyclovir - G/C negative -suspect could be moisture associated -so may need to replace foley unfortunately as patient not able  to use urinal without spilling and his condom cath leaked as well  Severe protein calorie malnutrition Nutrition Status: Nutrition Problem: Increased nutrient needs Etiology: wound healing Signs/Symptoms: estimated needs Interventions: Glucerna shake, Premier Protein, Juven, MVI    Notified urology at patient request that patient in the hospital: follows with Dr. Louis Meckel for kidney nodule  ? CIR once  medically ready if still a candidate   DVT prophylaxis:lovenox Code Status: partial Disposition Plan:  Status is: Inpatient  Remains inpatient appropriate because: needs IV abx  Consultants:  ID Neurosurgery PCCM Cardiology  Procedures:  Left TTA 07/23/2021 TEE/DCCV 07/29/2021    Subjective: Patient reports improved pain control in lower back with robaxin-- able to move better  Objective: Vitals:   08/11/21 0806 08/11/21 1459 08/11/21 2235 08/12/21 0535  BP: (!) 156/78 127/63 124/66 116/67  Pulse: 65  66 61  Resp:  20 18 20   Temp:  98.5 F (36.9 C) 98.4 F (36.9 C) 98.6 F (37 C)  TempSrc:  Oral Oral Oral  SpO2:  95% 98% 98%  Weight:      Height:        Intake/Output Summary (Last 24 hours) at 08/12/2021 0900 Last data filed at 08/12/2021 0136 Gross per 24 hour  Intake 1410 ml  Output 3275 ml  Net -1865 ml   Filed Weights   07/27/21 0252 07/29/21 0754 08/01/21 0459  Weight: 108.2 kg 108.2 kg 107 kg     General: Appearance:     Overweight male in no acute distress   Scrotum red, wound on sacrum with some discharge  Lungs:     Diminished, respirations unlabored  Heart:    Normal heart rate.    MS:   Below knee amputation of right lower extremity is noted. Left below knee amputation noted.   Neurologic:   Awake, alert, oriented x 3. No apparent focal neurological           defect.             Data Reviewed: I have personally reviewed following labs and imaging studies  CBC: Recent Labs  Lab 08/06/21 0158 08/08/21 0204 08/09/21 0250 08/10/21 0408 08/11/21 0301 08/12/21 0052  WBC 10.8* 7.9 7.4 6.2 6.6 6.6  NEUTROABS 9.9*  --   --   --   --   --   HGB 12.4* 12.2* 12.3* 12.4* 12.9* 13.2  HCT 38.2* 37.8* 39.2 40.0 40.1 41.2  MCV 89.0 89.2 90.3 91.5 90.3 90.5  PLT 413* 360 328 328 301 782   Basic Metabolic Panel: Recent Labs  Lab 08/08/21 0204 08/09/21 0250 08/10/21 0408 08/11/21 0301 08/12/21 0052  NA 129* 133* 132* 133* 132*  K 4.2  3.8 4.3 4.2 4.1  CL 94* 100 100 102 99  CO2 27 26 24 24 24   GLUCOSE 288* 216* 229* 181* 224*  BUN 37* 27* 29* 25* 32*  CREATININE 1.54* 1.21 1.15 1.12 1.35*  CALCIUM 7.8* 7.9* 7.9* 8.1* 8.3*  MG 1.6* 1.4*  --  1.6* 1.7   GFR: Estimated Creatinine Clearance: 81.8 mL/min (A) (by C-G formula based on SCr of 1.35 mg/dL (H)). Liver Function Tests: No results for input(s): AST, ALT, ALKPHOS, BILITOT, PROT, ALBUMIN in the last 168 hours.  No results for input(s): LIPASE, AMYLASE in the last 168 hours. No results for input(s): AMMONIA in the last 168 hours. Coagulation Profile: No results for input(s): INR, PROTIME in the last 168 hours. Cardiac Enzymes: No results for input(s): CKTOTAL, CKMB, CKMBINDEX, TROPONINI  in the last 168 hours. BNP (last 3 results) No results for input(s): PROBNP in the last 8760 hours. HbA1C: No results for input(s): HGBA1C in the last 72 hours. CBG: Recent Labs  Lab 08/11/21 0620 08/11/21 1132 08/11/21 1606 08/11/21 2232 08/12/21 0813  GLUCAP 166* 128* 158* 211* 239*   Lipid Profile: No results for input(s): CHOL, HDL, LDLCALC, TRIG, CHOLHDL, LDLDIRECT in the last 72 hours. Thyroid Function Tests: No results for input(s): TSH, T4TOTAL, FREET4, T3FREE, THYROIDAB in the last 72 hours. Anemia Panel: No results for input(s): VITAMINB12, FOLATE, FERRITIN, TIBC, IRON, RETICCTPCT in the last 72 hours. Urine analysis:    Component Value Date/Time   COLORURINE YELLOW 07/22/2021 0250   APPEARANCEUR CLOUDY (A) 07/22/2021 0250   LABSPEC 1.018 07/22/2021 0250   PHURINE 5.0 07/22/2021 0250   GLUCOSEU >=500 (A) 07/22/2021 0250   HGBUR SMALL (A) 07/22/2021 0250   BILIRUBINUR NEGATIVE 07/22/2021 0250   KETONESUR 5 (A) 07/22/2021 0250   PROTEINUR 30 (A) 07/22/2021 0250   UROBILINOGEN 0.2 09/13/2019 0906   NITRITE NEGATIVE 07/22/2021 0250   LEUKOCYTESUR TRACE (A) 07/22/2021 0250   No results found for this or any previous visit (from the past 240 hour(s)).     Radiology Studies: No results found.  Scheduled Meds:  acetaminophen  1,000 mg Oral TID   acyclovir  400 mg Oral TID   amiodarone  200 mg Oral Daily   vitamin C  1,000 mg Oral Daily   Chlorhexidine Gluconate Cloth  6 each Topical Daily   collagenase   Topical Daily   docusate sodium  100 mg Oral Daily   enoxaparin (LOVENOX) injection  105 mg Subcutaneous Q12H   gabapentin  300 mg Oral TID   insulin aspart  0-15 Units Subcutaneous TID WC   insulin aspart  0-5 Units Subcutaneous QHS   insulin aspart  6 Units Subcutaneous TID WC   insulin detemir  30 Units Subcutaneous BID   isosorbide mononitrate  30 mg Oral Daily   lidocaine  2 patch Transdermal Q24H   methocarbamol  1,000 mg Oral Q8H   metoprolol succinate  50 mg Oral Daily   multivitamin with minerals  1 tablet Oral Daily   nutrition supplement (JUVEN)  1 packet Oral BID BM   oxyCODONE  15 mg Oral Q12H   polyethylene glycol  17 g Oral Daily   Ensure Max Protein  11 oz Oral BID BM   [START ON 08/13/2021] torsemide  20 mg Oral QODAY   Continuous Infusions:  sodium chloride 10 mL/hr at 07/29/21 0816   vancomycin 750 mg (08/12/21 0154)     LOS: 21 days   Time spent: 35 minutes.  Geradine Girt, DO [facility] [email or web address] 08/12/2021, 9:00 AM

## 2021-08-13 ENCOUNTER — Inpatient Hospital Stay (HOSPITAL_COMMUNITY): Payer: Medicare HMO

## 2021-08-13 ENCOUNTER — Inpatient Hospital Stay: Payer: Self-pay

## 2021-08-13 DIAGNOSIS — R7881 Bacteremia: Secondary | ICD-10-CM | POA: Diagnosis not present

## 2021-08-13 DIAGNOSIS — E11628 Type 2 diabetes mellitus with other skin complications: Secondary | ICD-10-CM | POA: Diagnosis not present

## 2021-08-13 DIAGNOSIS — R079 Chest pain, unspecified: Secondary | ICD-10-CM | POA: Diagnosis not present

## 2021-08-13 DIAGNOSIS — I4892 Unspecified atrial flutter: Secondary | ICD-10-CM | POA: Diagnosis not present

## 2021-08-13 DIAGNOSIS — M4644 Discitis, unspecified, thoracic region: Secondary | ICD-10-CM | POA: Diagnosis not present

## 2021-08-13 DIAGNOSIS — I5032 Chronic diastolic (congestive) heart failure: Secondary | ICD-10-CM | POA: Diagnosis not present

## 2021-08-13 DIAGNOSIS — J90 Pleural effusion, not elsewhere classified: Secondary | ICD-10-CM | POA: Diagnosis not present

## 2021-08-13 DIAGNOSIS — N179 Acute kidney failure, unspecified: Secondary | ICD-10-CM | POA: Diagnosis not present

## 2021-08-13 DIAGNOSIS — A480 Gas gangrene: Secondary | ICD-10-CM | POA: Diagnosis not present

## 2021-08-13 DIAGNOSIS — I10 Essential (primary) hypertension: Secondary | ICD-10-CM | POA: Diagnosis not present

## 2021-08-13 LAB — COMPREHENSIVE METABOLIC PANEL
ALT: 35 U/L (ref 0–44)
AST: 22 U/L (ref 15–41)
Albumin: 1.6 g/dL — ABNORMAL LOW (ref 3.5–5.0)
Alkaline Phosphatase: 95 U/L (ref 38–126)
Anion gap: 7 (ref 5–15)
BUN: 27 mg/dL — ABNORMAL HIGH (ref 6–20)
CO2: 24 mmol/L (ref 22–32)
Calcium: 8.1 mg/dL — ABNORMAL LOW (ref 8.9–10.3)
Chloride: 101 mmol/L (ref 98–111)
Creatinine, Ser: 1.25 mg/dL — ABNORMAL HIGH (ref 0.61–1.24)
GFR, Estimated: >60 mL/min (ref >60)
Glucose, Bld: 263 mg/dL — ABNORMAL HIGH (ref 70–99)
Potassium: 4.2 mmol/L (ref 3.5–5.1)
Sodium: 132 mmol/L — ABNORMAL LOW (ref 135–145)
Total Bilirubin: 0.4 mg/dL (ref 0.3–1.2)
Total Protein: 5.6 g/dL — ABNORMAL LOW (ref 6.5–8.1)

## 2021-08-13 LAB — CBC
HCT: 40.8 % (ref 39.0–52.0)
Hemoglobin: 12.8 g/dL — ABNORMAL LOW (ref 13.0–17.0)
MCH: 28.4 pg (ref 26.0–34.0)
MCHC: 31.4 g/dL (ref 30.0–36.0)
MCV: 90.5 fL (ref 80.0–100.0)
Platelets: 284 10*3/uL (ref 150–400)
RBC: 4.51 MIL/uL (ref 4.22–5.81)
RDW: 15.2 % (ref 11.5–15.5)
WBC: 6.2 10*3/uL (ref 4.0–10.5)
nRBC: 0 % (ref 0.0–0.2)

## 2021-08-13 LAB — GLUCOSE, CAPILLARY
Glucose-Capillary: 253 mg/dL — ABNORMAL HIGH (ref 70–99)
Glucose-Capillary: 191 mg/dL — ABNORMAL HIGH (ref 70–99)
Glucose-Capillary: 130 mg/dL — ABNORMAL HIGH (ref 70–99)
Glucose-Capillary: 180 mg/dL — ABNORMAL HIGH (ref 70–99)

## 2021-08-13 LAB — LACTATE DEHYDROGENASE: LDH: 113 U/L (ref 98–192)

## 2021-08-13 LAB — TESTOSTERONE, FREE: Testosterone, Free: 8.8 pg/mL (ref 7.2–24.0)

## 2021-08-13 LAB — SEDIMENTATION RATE: Sed Rate: 37 mm/hr — ABNORMAL HIGH (ref 0–16)

## 2021-08-13 LAB — C-REACTIVE PROTEIN: CRP: 1.4 mg/dL — ABNORMAL HIGH (ref <1.0)

## 2021-08-13 IMAGING — US IR CHEST US
1 series · 4 of 4 positions shown · non-contrast
Comparison: CT of the chest on [DATE]

CLINICAL DATA: Bilateral pleural effusions by prior CT.

EXAM:
CHEST ULTRASOUND

[Series 1: ir (person_name)/(person_name) · 4 of 4 slices shown]
[im 1/4]
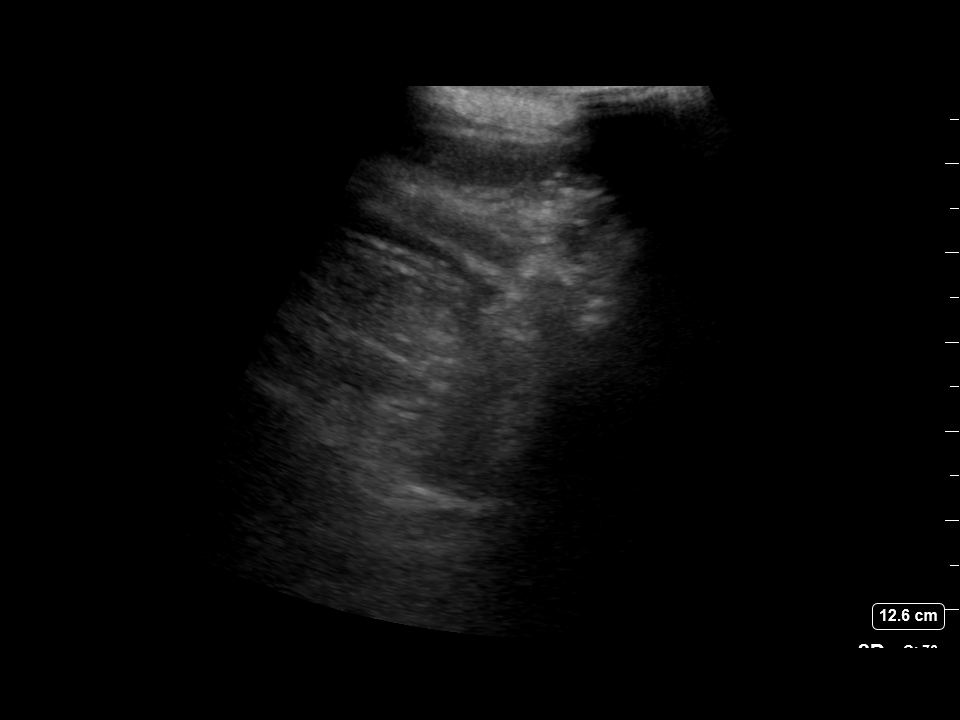
[im 2/4]
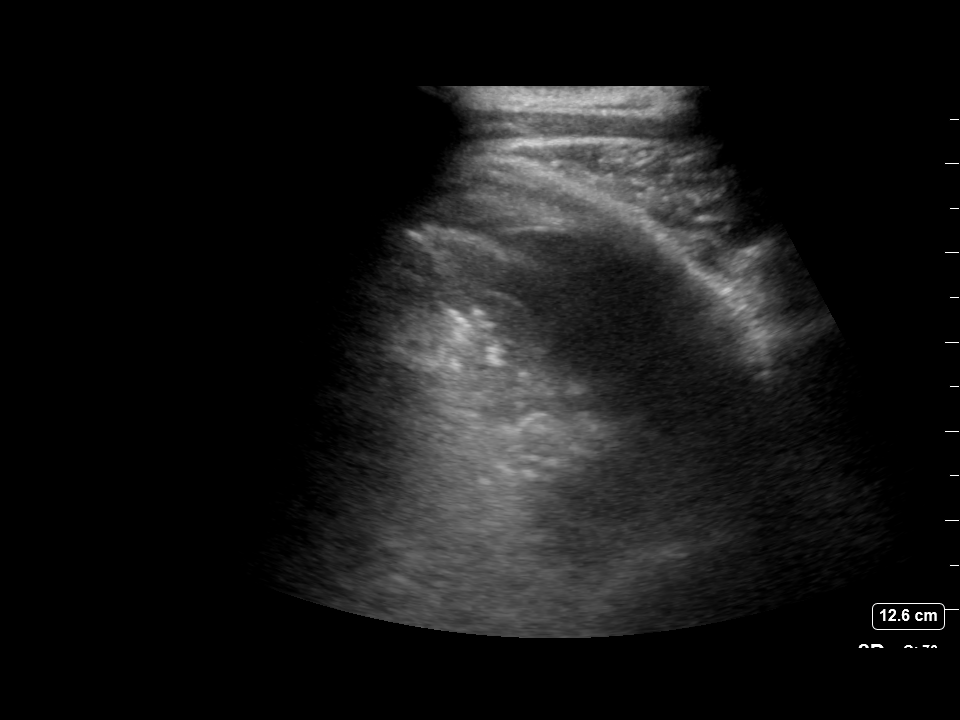
[im 3/4]
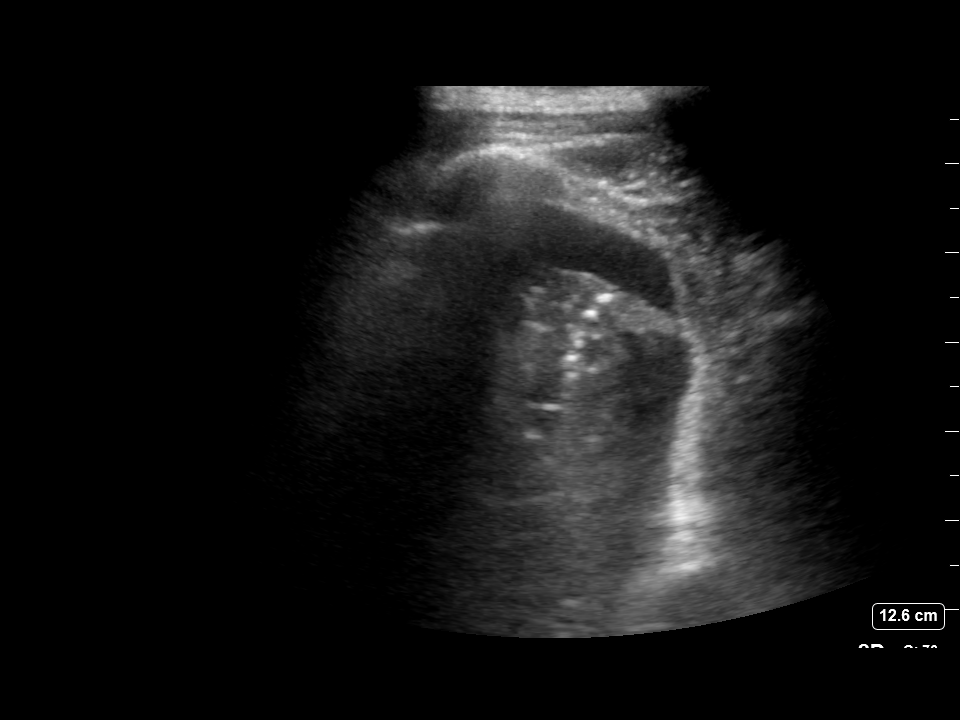
[im 4/4]
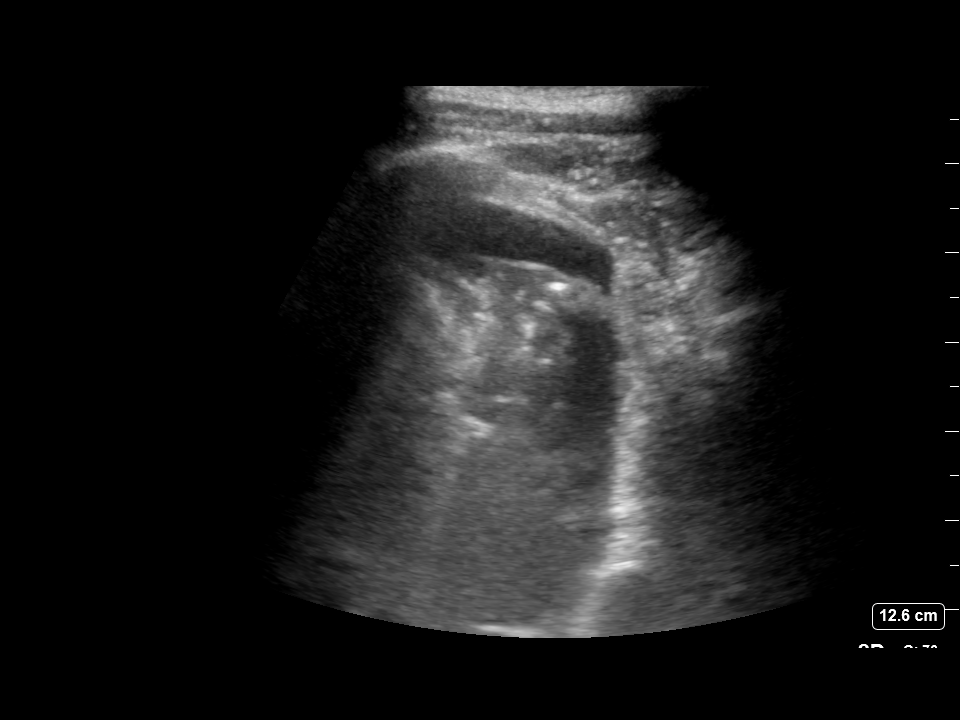

[4 of 4 positions shown; findings below may reference images not displayed]

FINDINGS: By ultrasound, there are only very small bilateral pleural
effusions. There was not enough fluid present to allow for safe
thoracentesis at this time.
IMPRESSION: Small volume bilateral pleural effusions. There was not enough fluid
present to warrant thoracentesis currently.

## 2021-08-13 MED ORDER — APIXABAN 5 MG PO TABS
5.0000 mg | ORAL_TABLET | Freq: Two times a day (BID) | ORAL | Status: DC
  Administered 2021-08-13 – 2021-09-09 (×55): 5 mg via ORAL
  Filled 2021-08-13 (×55): qty 1

## 2021-08-13 MED ORDER — LIDOCAINE HCL 1 % IJ SOLN
INTRAMUSCULAR | Status: AC
  Filled 2021-08-13: qty 20

## 2021-08-13 MED ORDER — SODIUM CHLORIDE 0.9% FLUSH
10.0000 mL | Freq: Two times a day (BID) | INTRAVENOUS | Status: DC
  Administered 2021-08-14 – 2021-08-31 (×23): 10 mL
  Administered 2021-08-31: 21:00:00 20 mL
  Administered 2021-09-01 – 2021-09-09 (×15): 10 mL

## 2021-08-13 MED ORDER — SODIUM CHLORIDE 0.9% FLUSH
10.0000 mL | INTRAVENOUS | Status: DC | PRN
  Administered 2021-09-02: 04:00:00 10 mL

## 2021-08-13 NOTE — Progress Notes (Signed)
Patient seen and examined this morning.  He complains of lumbosacral back pain which is chronic and has been managed in the past by Dr. Nelva Bush with injection therapy.  He denies significant thoracic back pain or thoracic radiculopathy.  He states he is turning side to side much better.  He states he is moving his legs well and denies numbness or tingling in the legs.  He seems to move his legs well in the bed there was right hip flexors are stronger than his left.  He has some left trapezius pain at times.  He looks nontoxic and looks much more comfortable.  At this point I am not sure there is much to be gained from surgical intervention.  He is neurologically intact as best we can tell from history and physical.  In fact, he looks quite a bit better.  I understand source control, but the source is certainly not the epidural space and the amount of exudate we could remove from the epidural space would be quite small.  It is probably mostly phlegmon at this point, and therefore the risk of neurologic injury with the surgery likely outweighs the risk without surgery given his clinical response to antibiotics so far.  Also worry that removal of the posterior elements over so many segments could lead to instability, and he is already at risk of this because of the osteomyelitis discitis.  Laminectomy certainly would not improve antibiotic response to the osteomyelitis and discitis.  So I find this to be a difficult situation.  The patient appears to be very comfortable with the decision making to this point.  Will continue to follow.

## 2021-08-13 NOTE — Progress Notes (Signed)
Peripherally Inserted Central Catheter Placement  The IV Nurse has discussed with the patient and/or persons authorized to consent for the patient, the purpose of this procedure and the potential benefits and risks involved with this procedure.  The benefits include less needle sticks, lab draws from the catheter, and the patient may be discharged home with the catheter. Risks include, but not limited to, infection, bleeding, blood clot (thrombus formation), and puncture of an artery; nerve damage and irregular heartbeat and possibility to perform a PICC exchange if needed/ordered by physician.  Alternatives to this procedure were also discussed.  Bard Power PICC patient education guide, fact sheet on infection prevention and patient information card has been provided to patient /or left at bedside.    PICC Placement Documentation  PICC Single Lumen 63/84/53 Right Basilic 44 cm 0 cm (Active)  Indication for Insertion or Continuance of Line Home intravenous therapies (PICC only) 08/13/21 2145  Exposed Catheter (cm) 0 cm 08/13/21 2145  Site Assessment Clean;Dry;Intact 08/13/21 2145  Line Status Blood return noted;Flushed;Saline locked 08/13/21 2145  Dressing Type Transparent;Securing device 08/13/21 2145  Dressing Status Clean;Dry;Intact 08/13/21 2145  Antimicrobial disc in place? Yes 08/13/21 2145  Safety Lock Not Applicable 64/68/03 2122  Line Care Connections checked and tightened 08/13/21 2145  Line Adjustment (NICU/IV Team Only) No 08/13/21 2145  Dressing Intervention New dressing 08/13/21 2145  Dressing Change Due 08/20/21 08/13/21 2145       Edson Snowball 08/13/2021, 10:02 PM

## 2021-08-13 NOTE — Progress Notes (Signed)
Physical Therapy Treatment Patient Details Name: [name] MRN: [id number] DOB: 02-04-1966 Today's Date: 08/13/2021   History of Present Illness Pt is 55 yo admitted 11/14 with infected left foot s/p Left BKA 11/16. Pt with T6-8 discitis. New onset CHF and aflutter.  CT 12/4 with pleural effusion. PMhx: Rt BKA, Aflutter, DM, depression and anxiety    PT Comments    Pt with gradual improvement.  He had improved pain with improved bed mobility.  Pt still adamant that he is not ready to attempt sitting EOB but wants to advance exercises and core strengthening.  Pt does demonstrate significant weakness in LE.  Session focused on exercises with education on self AAROM techniques and pelvic tilts with exercises for core strengthening.  Pt very pleased with these progressions.  Discussed that we do need to start progressing to EOB - hopefully in the next 1-2 sessions.     Recommendations for follow up therapy are one component of a multi-disciplinary discharge planning process, led by the attending physician.  Recommendations may be updated based on patient status, additional functional criteria and insurance authorization.  Follow Up Recommendations  Acute inpatient rehab (3hours/day)     Assistance Recommended at Discharge Frequent or constant Supervision/Assistance  Equipment Recommendations  Wheelchair (measurements PT);Wheelchair cushion (measurements PT);Other (comment) (bilateral BKA wheelchair assessment, may need slide board and drop arm BSC?; further assessment post acute)    Recommendations for Other Services       Precautions / Restrictions Precautions Precautions: Fall;Back Precaution Comments: back prec for comfort Required Braces or Orthoses: Other Brace Other Brace: LLE limb protector, R BK prosthesis Restrictions LLE Weight Bearing: Non weight bearing     Mobility  Bed Mobility Overal bed mobility: Needs Assistance Bed Mobility: Rolling Rolling: Supervision          General bed mobility comments: Pt able to roll for ADLs using rails; cues to bring leg over    Transfers                   General transfer comment: declined    Ambulation/Gait                   Stairs             Wheelchair Mobility    Modified Rankin (Stroke Patients Only)       Balance       Sitting balance - Comments: declined                                    Cognition Arousal/Alertness: Awake/alert Behavior During Therapy: WFL for tasks assessed/performed Overall Cognitive Status: Within Functional Limits for tasks assessed                                 General Comments: Pt with intact cognition but is more focused on exercises and strengthening , stating not ready for OOB/EOB activity        Exercises Other Exercises Other Exercises: Pt performing quad sets and glut sets on his own.  Has L knee ROM 0 to at least 110 degrees and hip flexion to neutral ext (at least) and flexion to at least 100 degrees. Other Exercises: Performed 10 reps bil: pelvic tilts, straight leg raise AAROM with pelvic tilts, AAROM hip abd/add, hip ADD pillow squeeze, Knee flexion/ext with hip flexed to  90 degrees using hands to stabilize thigh.  Educated pt on AAROM techniques using hands and gait belt so that he could perform on his own.  Also, cues for relaxation after each rep and breathing. Other Exercises: Able to elevated HOB to 45 degrees for 5 mins; pt able to hold both rails with UE and pull to start to lift back off bed at this angle (clearing shoulder blades)    General Comments General comments (skin integrity, edema, etc.): Pt placed therapist on phone with his son who is head strength and conditioning coach at university in [place].  Son with questions regarding therapy treatments and if including core, joint mobilizations, stretching, etc and goals of therapy progression.  Therapist explained this was this therapist  first time with pt.  Reviewed and pt has full LE HEP, getting OT with Tband for UE exercises, discussed joint mobilization not indicated for pt as his ROM is excellent, and performing gentle stretching.  Also, discussed light core exercises due to back pain can be added.  Son and pt focused on exercises and strengthening. Son asking if core exercises can improve pain, discussed that they could but that with osteomyelitis discitis pt's pain seems to be improving on antibiotics and that strengthening core may not be the sole answer to his pain improvement. Hopefully pain will continue to improve to the point that he can tolerate more exercise and mobility.  Pt frequently states "I need to strengthen my lower core to progress."  Discussed we can start with exercises and continue to include but do need to start progressing mobility to EOB which will also strengthen core.      Pertinent Vitals/Pain Pain Assessment: 0-10 Pain Score: 7  Pain Location: low back pain Pain Descriptors / Indicators: Grimacing;Guarding;Other (Comment);Pressure Pain Intervention(s): Limited activity within patient's tolerance;Monitored during session;RN gave pain meds during session    Home Living                          Prior Function            PT Goals (current goals can now be found in the care plan section) Progress towards PT goals: Progressing toward goals    Frequency    Min 3X/week      PT Plan Current plan remains appropriate    Co-evaluation              AM-PAC PT "6 Clicks" Mobility   Outcome Measure  Help needed turning from your back to your side while in a flat bed without using bedrails?: A Little Help needed moving from lying on your back to sitting on the side of a flat bed without using bedrails?: Total Help needed moving to and from a bed to a chair (including a wheelchair)?: Total Help needed standing up from a chair using your arms (e.g., wheelchair or bedside chair)?:  Total Help needed to walk in hospital room?: Total Help needed climbing 3-5 steps with a railing? : Total 6 Click Score: 8    End of Session   Activity Tolerance: Patient tolerated treatment well Patient left: in bed;with call bell/phone within reach Nurse Communication: Mobility status PT Visit Diagnosis: Other abnormalities of gait and mobility (R26.89);Muscle weakness (generalized) (M62.81)     Time: 9735-3299 PT Time Calculation (min) (ACUTE ONLY): 51 min  Charges:  $Therapeutic Exercise: 23-37 mins $Therapeutic Activity: 8-22 mins  Abran Richard, PT Acute Rehab Services Pager [phone] [facility] [place] 08/13/2021, 5:02 PM

## 2021-08-13 NOTE — Progress Notes (Signed)
Inpatient Rehab Admissions Coordinator:   Pt continues to decline out of bed, or even upright, mobility.  He's not demonstrating enough tolerance for CIR at this time so we will sign off and recommend SNF.  Note TOC already on board and I will let them know.   Shann Medal, PT, DPT Admissions Coordinator [phone] 08/13/21  10:53 AM

## 2021-08-13 NOTE — Progress Notes (Signed)
Occupational Therapy Treatment Patient Details Name: [name] MRN: [id number] DOB: 05/28/1966 Today's Date: 08/13/2021   History of present illness 55 yo admitted 11/14 with infected left foot s/p Left BKA 11/16. Pt with T6-8 discitis. New onset CHF and aflutter.  CT 12/4 with pleural effusion. PMhx: Rt BKA, Aflutter, DM, depression and anxiety   OT comments  Pt supine in bed and agreeable to OT.  Voiced frustration with medical stay and associated weakness, but reports he needs to "strengthen his hips and core before even trying to get up out of bed" (or EOB).  Patient educated on importance of upright positioning for strengthening and tolerance, but limited due to decreased receptiveness to education.  Pt agreeable to combination of pain and weakness affecting mobility, but pt unwilling to work through pain (even when premedicated)- focused on need to increased strength first. Educated on use of remote to increased upright tolerance, pt able to lay flat and increase HOB to 30* today-- plans to work towards 45 degrees between now and next session. Able to roll to L and R with min guard today.  Provided level 2 theraband and reviewed exercises, min cueing throughout for 5 second hold at end range with core activation at initiation of exercise. Will follow acutely.    Recommendations for follow up therapy are one component of a multi-disciplinary discharge planning process, led by the attending physician.  Recommendations may be updated based on patient status, additional functional criteria and insurance authorization.    Follow Up Recommendations  Skilled nursing-short term rehab (<3 hours/day)    Assistance Recommended at Discharge Frequent or constant Supervision/Assistance  Equipment Recommendations  None recommended by OT    Recommendations for Other Services      Precautions / Restrictions Precautions Precautions: Fall;Back (for comfort) Precaution Comments: Contact  -MRSA Required Braces or Orthoses: Other Brace Other Brace: LLE limb protector, R BK prosthesis Restrictions Weight Bearing Restrictions: Yes LLE Weight Bearing: Non weight bearing       Mobility Bed Mobility Overal bed mobility: Needs Assistance Bed Mobility: Rolling Rolling: Min guard         General bed mobility comments: pt using rails to roll to L and R, min guard for safety with increased time.  Utilized remote to demonstrate ability to lay flat, pull self up in bed with supervision.  Able to elevate HOB to 30* but not past due to back pain.    Transfers                   General transfer comment: declined     Balance                                           ADL either performed or assessed with clinical judgement   ADL Overall ADL's : Needs assistance/impaired                                       General ADL Comments: focused on UE strengthening    Extremity/Trunk Assessment              Vision       Perception     Praxis      Cognition Arousal/Alertness: Awake/alert Behavior During Therapy: WFL for tasks assessed/performed;Anxious Overall Cognitive Status: Within Functional Limits  for tasks assessed                                 General Comments: functional but decreased understanding of importance of OOB?  Pt focused on needing to "strengthen his hip flexors and core before getting up". Pt easily frustrated and not receptive to further education.          Exercises Exercises: Other exercises Other Exercises Other Exercises: UE theraband exercises level 2--x 10 reps 1 set of: foward press, overhead press, shoulder flexion and tricep dips.  Pt requires cueing to sustain 5 second hold at end range.  Educated on completing 3x/day 10 reps and increasing reps as tolerated.  Educated on holding tight with each rep.   Shoulder Instructions       General Comments VSS on RA     Pertinent Vitals/ Pain       Pain Assessment: Faces Faces Pain Scale: Hurts even more Pain Location: lower back with HOB > 30* Pain Descriptors / Indicators: Grimacing;Guarding;Other (Comment);Pressure Pain Intervention(s): Limited activity within patient's tolerance;Monitored during session;Premedicated before session;Repositioned  Home Living                                          Prior Functioning/Environment              Frequency  Min 2X/week        Progress Toward Goals  OT Goals(current goals can now be found in the care plan section)  Progress towards OT goals: Progressing toward goals  Acute Rehab OT Goals Patient Stated Goal: to get my core stronger OT Goal Formulation: With patient  Plan Discharge plan needs to be updated;Frequency remains appropriate    Co-evaluation                 AM-PAC OT "6 Clicks" Daily Activity     Outcome Measure   Help from another person eating meals?: A Little Help from another person taking care of personal grooming?: A Little Help from another person toileting, which includes using toliet, bedpan, or urinal?: Total Help from another person bathing (including washing, rinsing, drying)?: A Lot Help from another person to put on and taking off regular upper body clothing?: A Little Help from another person to put on and taking off regular lower body clothing?: Total 6 Click Score: 13    End of Session    OT Visit Diagnosis: Muscle weakness (generalized) (M62.81);Pain;Other abnormalities of gait and mobility (R26.89) Pain - part of body:  (back)   Activity Tolerance Patient tolerated treatment well;Patient limited by pain   Patient Left in bed;with call bell/phone within reach;Other (comment) (transport arrived)   Nurse Communication Mobility status        Time: [phone] OT Time Calculation (min): 23 min  Charges: OT General Charges $OT Visit: 1 Visit OT Treatments $Therapeutic  Exercise: 23-37 mins  Jolaine Artist, OT [place] Pager [phone] Office [phone]   Delight Stare 08/13/2021, 10:20 AM

## 2021-08-13 NOTE — Progress Notes (Signed)
[name], male   DOB: 1966/02/24, 55 y.o.   MRN: [id number] Pt presented to [place] dept today for thoracentesis. On limited US of both right /left pleural spaces there is only a small amount of free pleural fluid noted with atelectatic lung in close proximity. Procedure cancelled. Pt states his breathing has improved.

## 2021-08-13 NOTE — Progress Notes (Signed)
Inpatient Diabetes Program Recommendations  AACE/ADA: New Consensus Statement on Inpatient Glycemic Control (2015)  Target Ranges:  Prepandial:   less than 140 mg/dL      Peak postprandial:   less than 180 mg/dL (1-2 hours)      Critically ill patients:  140 - 180 mg/dL   Lab Results  Component Value Date   GLUCAP 191 (H) 08/13/2021   HGBA1C 13.8 (H) 07/22/2021    Review of Glycemic Control  Latest Reference Range & Units 08/12/21 11:49 08/12/21 16:20 08/12/21 22:34 08/13/21 07:42 08/13/21 11:58  Glucose-Capillary 70 - 99 mg/dL 210 (H) 240 (H) 244 (H) 253 (H) 191 (H)  Diabetes history: Type 2 DM Outpatient Diabetes medications: Novolog 70/30 20-30 units BID, Regular ~1:3 CR Current orders for Inpatient glycemic control: Levemir 30 units QD, Novolog 6 units TID, Novolog 0-15 units TID & HS  Inpatient Diabetes Program Recommendations:    Consider increasing Levemir to 34 units bid.    Thanks,  Adah Perl, RN, BC-ADM Inpatient Diabetes Coordinator Pager [phone]  (8a-5p)

## 2021-08-13 NOTE — Progress Notes (Signed)
Physical Therapy Wound Treatment Patient Details  Name: [name] MRN: [id number] Date of Birth: 11-26-1965  Today's Date: 08/13/2021 Time: 7253-6644 Time Calculation (min): 51 min  Subjective  Subjective Assessment Subjective: I can't control my pee.. Patient and Family Stated Goals: Get well and get out of here... Date of Onset:  (unknown) Prior Treatments:  (unknown)  Pain Score:  1-2/10 after premedication   Wound Assessment  Pressure Injury 07/27/21 Sacrum Mid Unstageable - Full thickness tissue loss in which the base of the injury is covered by slough (yellow, tan, gray, green or brown) and/or eschar (tan, brown or black) in the wound bed. small pink pressure ulcer with bre (Active)  Wound Image   08/13/21 1440  Dressing Type Barrier Film (skin prep);Foam - Lift dressing to assess site every shift;Gauze (Comment);Moist to dry;Santyl 08/13/21 1440  Dressing Changed;Clean;Dry;Intact 08/13/21 1440  Dressing Change Frequency PRN 08/13/21 1440  State of Healing Eschar 08/13/21 1440  Site / Wound Assessment Other (Comment) 08/13/21 1440  % Wound base Red or Granulating 20% 08/13/21 1440  % Wound base Yellow/Fibrinous Exudate 20% 08/13/21 1440  % Wound base Black/Eschar 60% 08/13/21 1440  % Wound base Other/Granulation Tissue (Comment) 0% 08/13/21 1440  Peri-wound Assessment Excoriated 08/13/21 1440  Wound Length (cm) 7 cm 08/13/21 1440  Wound Width (cm) 6 cm 08/13/21 1440  Wound Depth (cm) 0 cm 08/13/21 1440  Wound Surface Area (cm^2) 42 cm^2 08/13/21 1440  Wound Volume (cm^3) 0 cm^3 08/13/21 1440  Drainage Amount Scant 08/13/21 1440  Drainage Description Serous 08/13/21 1440  Treatment Cleansed;Debridement (Selective);Hydrotherapy (Pulse lavage);Packing (Saline gauze) 08/13/21 1440      Hydrotherapy Pulsed lavage therapy - wound location: sacral Pulsed Lavage with Suction (psi): 8 psi (8=12) Pulsed Lavage with Suction - Normal Saline Used: 1000 mL Pulsed Lavage Tip:  Tip with splash shield Selective Debridement Selective Debridement - Location: sacral Selective Debridement - Tools Used: Forceps, Scalpel Selective Debridement - Tissue Removed: cross-hatched sacral eschar    Wound Assessment and Plan  Wound Therapy - Assess/Plan/Recommendations Wound Therapy - Clinical Statement: pt can benefit from hydrotherapy for pulsed lavage to cleanse and soften wound/eschar for selective debridement and to decrease bio burden.  Pt will need better urine management than present system and will likely need some topical anti-fungal powder that we can "crust" in place with the skin prep pads. Wound Therapy - Functional Problem List: up to 3 weeks or more relatively immobile in bed. Factors Delaying/Impairing Wound Healing: Diabetes Mellitus, Incontinence, Infection - systemic/local, Immobility, Multiple medical problems Hydrotherapy Plan: Debridement, Dressing change, Patient/family education, Pulsatile lavage with suction Wound Therapy - Frequency: 6X / week Wound Therapy - Current Recommendations: PT Wound Therapy - Follow Up Recommendations: dressing changes by family/patient, dressing changes by RN  Wound Therapy Goals- Improve the function of patient's integumentary system by progressing the wound(s) through the phases of wound healing (inflammation - proliferation - remodeling) by: Wound Therapy Goals - Improve the function of patient's integumentary system by progressing the wound(s) through the phases of wound healing by: Decrease Necrotic Tissue to: 20% Decrease Necrotic Tissue - Progress: Goal set today Increase Granulation Tissue to: 80% Increase Granulation Tissue - Progress: Goal set today Goals/treatment plan/discharge plan were made with and agreed upon by patient/family: Yes Time For Goal Achievement: 7 days Wound Therapy - Potential for Goals: Good  Goals will be updated until maximal potential achieved or discharge criteria met.  Discharge criteria:  when goals achieved, discharge from hospital, MD decision/surgical intervention, no  progress towards goals, refusal/missing three consecutive treatments without notification or medical reason.  GP     Charges PT Wound Care Charges $Wound Debridement up to 20 cm: < or equal to 20 cm $ Wound Debridement each add'l 20 sqcm: 1 $PT PLS Gun and Tip: 1 Supply $PT Hydrotherapy Visit: 1 Visit       [name] Deloy Archey 08/13/2021, 2:51 PM 08/13/2021  [name]., PT Acute Rehabilitation Services [phone]  (pager) [phone]  (office)

## 2021-08-13 NOTE — Progress Notes (Signed)
PROGRESS NOTE  Werner Labella  FOY:[id number] DOB: May 24, 1966 DOA: 07/21/2021 PCP: Patient, No Pcp Per (Inactive)   Brief Narrative: 55 year old male with history of diabetes mellitus type 2, diabetic foot infection with prior right BKA, chronic diastolic CHF, hypertension, dyslipidemia presented with worsening left foot swelling/discoloration and discharge.  On presentation, he was hypotensive, tachycardic with A. fib with RVR with CT suggestive of first MTP osteomyelitis and soft tissue gas concerning for possible necrotizing fasciitis along with AKI with creatinine of 2.7 and mild DKA.  His blood pressures did not improve with IV fluids and he was transferred to ICU and PCCM was consulted.  Orthopedics was consulted.  He underwent left transtibial amputation on 07/23/2021.  He was found to have strep agalactiae bacteremia.  ID was consulted.  He was found to have vertebral discitis versus osteomyelitis on MRI.  He was also found to have new systolic dysfunction with LVEF of 35% along with new onset atrial flutter requiring IV amiodarone which has been switched to oral.  Cardiology was consulted.  He has been started on Eliquis.  S/p TEE followed by DCCV on 11/22.  Developed worsening back pain 11/26-11/27, ID reconsulted, requested MRI T/L-spine, ultimately performed 12/1, with contrast which showed Significant progression of discitis and osteomyelitis at T5 through T8. There is edema extending into the posterior elements. Extensive posterior epidural abscess has progressed since the prior study. Abscess extends from T3 through T9 and is exerting mass-effect on the cord with moderate spinal stenosis T4 through T8.   Due to report of pleural effusions on MRI, thoracentesis was attempted but there was insufficient fluid volume.   Neurosurgery, Dr. Ronnald Ramp, has evaluated the patient recommends no surgery. ID has changed antibiotics to vancomycin with plans for protracted IV abx course thru PICC. The patient  has been working less with PT and OT, so discharge to CIR may not be an option.    Assessment & Plan: Principal Problem:   Discitis of thoracic region Active Problems:   Essential hypertension   Stage 3a chronic kidney disease (HCC)   Type 2 diabetes mellitus with diabetic polyneuropathy, with long-term current use of insulin (HCC)   Right below-knee amputee (HCC)   Diabetic infection of left foot (HCC)   Chronic diastolic (congestive) heart failure (HCC)   Severe sepsis with acute organ dysfunction (HCC)   Chest pain   AKI (acute kidney injury) ([place])   Gas gangrene of foot (HCC)   Atrial flutter (HCC)   Septic shock (HCC)   Systolic dysfunction   Pressure injury of skin   MSSA bacteremia  Septic shock: Evolving on admission from bacteremia/diabetic foot infection -Off pressors.  Shock resolved. -Required care in ICU under PCCM service: Care has been transferred to [facility] from 07/27/2021 onwards   Diabetic foot infection, left: Status post left transtibial amputation on 07/23/2021. - Wound/wound VAC DC'ed per Dr. Sharol Given, recommends dry dressing.    Group B streptococcus bacteremia, progressive thoracic discitis/osteomyelitis. No vegetation on TTE or TEE during cardioversion.  MRI repeated with contrast on 12/1 showed Significant progression of discitis and osteomyelitis at T5 through T8 and extensive posterior epidural abscess has progressed since the prior study extending T3 through T9 exerting mass-effect on the cord with moderate spinal stenosis T4 through T8. - Neurosurgery reconsulted, continues to recommend IV antibiotics and advise against surgery in this difficult situation.  - Continue IV abx (vancomycin) thru PICC (will order to be placed). Cleared blood cultures 07/24/2021.    New onset systolic CHF: Echo  showed EF of 35% (previous echo showed EF of 50 to 55% in 04/2021). Cardiomyopathy may be from new onset atrial flutter RVR related tachycardia versus stress induced  cardiomyopathy from bacteremia and septic shock. - Cardiology no longer following, recommended torsemide 20mg  daily for now. - Continue metop succinate, bidil. AKI limiting ACE/ARB/ARNI and MRA.  New onset atrial flutter: Maintained NSR since DCCV 11/22 - Continue metoprolol succinate 50mg  daily. - Continue amiodarone 200mg  daily - Restart eliquis since no procedural plans, preferably uninterrupted anticoagulation for 1 month post DCCV.  T2DM uncontrolled with hyperglycemia:  - Continue basal-bolus insulin - Counseled patient regarding importance of dietary restrictions.  AKI on stage IIIa CKD: AKI resolving with limitation on diuresis. - Monitor intermittently while on vancomycin. CrCl now improved.   Pt takes testosterone/anabolic steroids (not prescription):  - Levels of testosterone low x1, rechecked and pending.    Hyponatremia: Improved.   Hypomagnesemia: Improved with supplementation.  - Follow periodically.   Leukocytosis: Resolved.   Thrombocytopenia: Resolved   Generalized deconditioning - PT recommends CIR vs. SNF. Pt advised of need to work with therapy more.   Acute on chronic pain: Follows with Dr. Nelva Bush, pain management.  Has received steroid injections to the back which, we've confirmed, will be delayed until completion of IV antibiotics. - Reviewed [place] PDMP and patient regularly fills opioid prescriptions. Likely opioid-dependent. - Obviously has reasons for acute pain from recent surgery, discitis/osteomyelitis. - Continue multimodal pain control as ordered: - Tylenol 1 g 3 times daily. - Oxy IR 10 mg every 4 hourly as needed for moderate pain - OxyContin 15 Mg every 12 hours - Lidocaine patch increased to 2 patches - Dilaudid 0.5 mg IV every 4 hourly as needed severe pain - Gabapentin 300 mg 3 times daily   Insomnia - Started trazodone 50 Mg at bedtime as needed on 11/24 with good effect, continue.  RN Pressure Injury Documentation: Pressure Injury  07/27/21 Sacrum Mid Unstageable - Full thickness tissue loss in which the base of the injury is covered by slough (yellow, tan, gray, green or brown) and/or eschar (tan, brown or black) in the wound bed. small pink pressure ulcer with bre (Active)  07/27/21 0255  Location: Sacrum  Location Orientation: Mid  Staging: Unstageable - Full thickness tissue loss in which the base of the injury is covered by slough (yellow, tan, gray, green or brown) and/or eschar (tan, brown or black) in the wound bed.  Wound Description (Comments): small pink pressure ulcer with break in skin 08/12/21; updated to unstageable pressure injury  Present on Admission: Yes   DVT prophylaxis: Eliquis Code Status: Full Family Communication: None at bedside Disposition Plan:  Status is: Inpatient  Remains inpatient appropriate because: Nearing medical stability, though CIR may not be an option any longer.  Consultants:  ID Neurosurgery PCCM Cardiology  Procedures:  Left TTA 07/23/2021 TEE/DCCV 07/29/2021  Antimicrobials: Cefazolin 11/16 Ceftriaxone 11/14, 11/16-17 Clindamycin 11/15-16 Linezolid 11/15 Pen G 11/18-p Pip-tazo 11/15-11/16 Vancomycin 11/14  Subjective: Pain severe when mobilizing, limiting pt's ability/willingness to get OOB. Pain in lower back more chronic, mid-upper back is improving overall, no fevers. Wants to advance as quickly as possible.  Objective: Vitals:   08/12/21 0535 08/12/21 1228 08/12/21 1517 08/12/21 2135  BP: 116/67  108/65 137/72  Pulse: 61 63 63 70  Resp: 20  19 19   Temp: 98.6 F (37 C) 97.9 F (36.6 C) 97.7 F (36.5 C) 98 F (36.7 C)  TempSrc: Oral Oral Oral Oral  SpO2:  98% 97% 96% 98%  Weight:      Height:        Intake/Output Summary (Last 24 hours) at 08/13/2021 1456 Last data filed at 08/13/2021 1340 Gross per 24 hour  Intake 240 ml  Output 2000 ml  Net -1760 ml   Filed Weights   07/27/21 0252 07/29/21 0754 08/01/21 0459  Weight: 108.2 kg 108.2 kg  107 kg   Gen: 55 y.o. male in no distress Pulm: Nonlabored breathing room air. Clear. CV: Regular rate and rhythm. No murmur, rub, or gallop. No JVD, no dependent edema. GI: Abdomen soft, non-tender, non-distended, with normoactive bowel sounds.  Ext: Warm, amputations stable. Skin: Sacral decubitus ulcer without exudate as pictured with associated MASD. On exam today, no new rashes, lesions or ulcers on visualized skin. Neuro: Alert and oriented. No focal neurological deficits. Psych: Judgement and insight appear fair. Mood euthymic & affect congruent. Behavior is appropriate.    Data Reviewed: I have personally reviewed following labs and imaging studies  CBC: Recent Labs  Lab 08/09/21 0250 08/10/21 0408 08/11/21 0301 08/12/21 0052 08/13/21 0337  WBC 7.4 6.2 6.6 6.6 6.2  HGB 12.3* 12.4* 12.9* 13.2 12.8*  HCT 39.2 40.0 40.1 41.2 40.8  MCV 90.3 91.5 90.3 90.5 90.5  PLT 328 328 301 319 945   Basic Metabolic Panel: Recent Labs  Lab 08/08/21 0204 08/09/21 0250 08/10/21 0408 08/11/21 0301 08/12/21 0052 08/13/21 0337  NA 129* 133* 132* 133* 132* 132*  K 4.2 3.8 4.3 4.2 4.1 4.2  CL 94* 100 100 102 99 101  CO2 27 26 24 24 24 24   GLUCOSE 288* 216* 229* 181* 224* 263*  BUN 37* 27* 29* 25* 32* 27*  CREATININE 1.54* 1.21 1.15 1.12 1.35* 1.25*  CALCIUM 7.8* 7.9* 7.9* 8.1* 8.3* 8.1*  MG 1.6* 1.4*  --  1.6* 1.7  --    GFR: Estimated Creatinine Clearance: 88.3 mL/min (A) (by C-G formula based on SCr of 1.25 mg/dL (H)). Liver Function Tests: Recent Labs  Lab 08/13/21 0337  AST 22  ALT 35  ALKPHOS 95  BILITOT 0.4  PROT 5.6*  ALBUMIN 1.6*   No results for input(s): LIPASE, AMYLASE in the last 168 hours. No results for input(s): AMMONIA in the last 168 hours. Coagulation Profile: No results for input(s): INR, PROTIME in the last 168 hours. Cardiac Enzymes: No results for input(s): CKTOTAL, CKMB, CKMBINDEX, TROPONINI in the last 168 hours. BNP (last 3 results) No results  for input(s): PROBNP in the last 8760 hours. HbA1C: No results for input(s): HGBA1C in the last 72 hours. CBG: Recent Labs  Lab 08/12/21 1149 08/12/21 1620 08/12/21 2234 08/13/21 0742 08/13/21 1158  GLUCAP 210* 240* 244* 253* 191*   Lipid Profile: No results for input(s): CHOL, HDL, LDLCALC, TRIG, CHOLHDL, LDLDIRECT in the last 72 hours. Thyroid Function Tests: No results for input(s): TSH, T4TOTAL, FREET4, T3FREE, THYROIDAB in the last 72 hours. Anemia Panel: No results for input(s): VITAMINB12, FOLATE, FERRITIN, TIBC, IRON, RETICCTPCT in the last 72 hours. Urine analysis:    Component Value Date/Time   COLORURINE YELLOW 07/22/2021 0250   APPEARANCEUR CLOUDY (A) 07/22/2021 0250   LABSPEC 1.018 07/22/2021 0250   PHURINE 5.0 07/22/2021 0250   GLUCOSEU >=500 (A) 07/22/2021 0250   HGBUR SMALL (A) 07/22/2021 0250   BILIRUBINUR NEGATIVE 07/22/2021 0250   KETONESUR 5 (A) 07/22/2021 0250   PROTEINUR 30 (A) 07/22/2021 0250   UROBILINOGEN 0.2 09/13/2019 0906   NITRITE NEGATIVE 07/22/2021 0250  LEUKOCYTESUR TRACE (A) 07/22/2021 0250   No results found for this or any previous visit (from the past 240 hour(s)).    Radiology Studies: IR US CHEST  Result Date: 08/13/2021 CLINICAL DATA:  Bilateral pleural effusions by prior CT. EXAM: CHEST ULTRASOUND COMPARISON:  CT of the chest on 08/10/2021 FINDINGS: By ultrasound, there are only very small bilateral pleural effusions. There was not enough fluid present to allow for safe thoracentesis at this time. IMPRESSION: Small volume bilateral pleural effusions. There was not enough fluid present to warrant thoracentesis currently. Electronically Signed   By: Aletta Edouard M.D.   On: 08/13/2021 12:59    Scheduled Meds:  acetaminophen  1,000 mg Oral TID   acyclovir  400 mg Oral TID   amiodarone  200 mg Oral Daily   vitamin C  1,000 mg Oral Daily   Chlorhexidine Gluconate Cloth  6 each Topical Daily   collagenase   Topical Daily   docusate  sodium  100 mg Oral Daily   enoxaparin (LOVENOX) injection  105 mg Subcutaneous Q12H   gabapentin  300 mg Oral TID   insulin aspart  0-15 Units Subcutaneous TID WC   insulin aspart  0-5 Units Subcutaneous QHS   insulin aspart  6 Units Subcutaneous TID WC   insulin detemir  30 Units Subcutaneous BID   isosorbide mononitrate  30 mg Oral Daily   lidocaine  2 patch Transdermal Q24H   methocarbamol  1,000 mg Oral Q8H   metoprolol succinate  50 mg Oral Daily   multivitamin with minerals  1 tablet Oral Daily   nutrition supplement (JUVEN)  1 packet Oral BID BM   oxyCODONE  15 mg Oral Q12H   polyethylene glycol  17 g Oral Daily   Ensure Max Protein  11 oz Oral BID BM   torsemide  20 mg Oral QODAY   Continuous Infusions:  sodium chloride Stopped (08/13/21 0457)   vancomycin 750 mg (08/13/21 1028)     LOS: 22 days   Time spent: 25 minutes.  Patrecia Pour, MD [facility] [email or web address] 08/13/2021, 2:56 PM

## 2021-08-14 DIAGNOSIS — R7881 Bacteremia: Secondary | ICD-10-CM | POA: Diagnosis not present

## 2021-08-14 DIAGNOSIS — I10 Essential (primary) hypertension: Secondary | ICD-10-CM | POA: Diagnosis not present

## 2021-08-14 DIAGNOSIS — I5032 Chronic diastolic (congestive) heart failure: Secondary | ICD-10-CM | POA: Diagnosis not present

## 2021-08-14 DIAGNOSIS — A480 Gas gangrene: Secondary | ICD-10-CM | POA: Diagnosis not present

## 2021-08-14 DIAGNOSIS — I4892 Unspecified atrial flutter: Secondary | ICD-10-CM | POA: Diagnosis not present

## 2021-08-14 DIAGNOSIS — R079 Chest pain, unspecified: Secondary | ICD-10-CM | POA: Diagnosis not present

## 2021-08-14 DIAGNOSIS — E11628 Type 2 diabetes mellitus with other skin complications: Secondary | ICD-10-CM | POA: Diagnosis not present

## 2021-08-14 DIAGNOSIS — N179 Acute kidney failure, unspecified: Secondary | ICD-10-CM | POA: Diagnosis not present

## 2021-08-14 DIAGNOSIS — M4644 Discitis, unspecified, thoracic region: Secondary | ICD-10-CM | POA: Diagnosis not present

## 2021-08-14 LAB — GLUCOSE, CAPILLARY
Glucose-Capillary: 97 mg/dL (ref 70–99)
Glucose-Capillary: 206 mg/dL — ABNORMAL HIGH (ref 70–99)
Glucose-Capillary: 151 mg/dL — ABNORMAL HIGH (ref 70–99)
Glucose-Capillary: 125 mg/dL — ABNORMAL HIGH (ref 70–99)
Glucose-Capillary: 197 mg/dL — ABNORMAL HIGH (ref 70–99)

## 2021-08-14 LAB — CBC
HCT: 40.6 % (ref 39.0–52.0)
Hemoglobin: 12.9 g/dL — ABNORMAL LOW (ref 13.0–17.0)
MCH: 28.9 pg (ref 26.0–34.0)
MCHC: 31.8 g/dL (ref 30.0–36.0)
MCV: 90.8 fL (ref 80.0–100.0)
Platelets: 321 10*3/uL (ref 150–400)
RBC: 4.47 MIL/uL (ref 4.22–5.81)
RDW: 15.2 % (ref 11.5–15.5)
WBC: 7.3 10*3/uL (ref 4.0–10.5)
nRBC: 0 % (ref 0.0–0.2)

## 2021-08-14 MED ORDER — SODIUM CHLORIDE 0.9 % IV SOLN
2.0000 g | Freq: Three times a day (TID) | INTRAVENOUS | Status: AC
  Administered 2021-08-14 – 2021-08-21 (×21): 2 g via INTRAVENOUS
  Filled 2021-08-14 (×21): qty 2

## 2021-08-14 NOTE — Progress Notes (Signed)
Nutrition Follow-up  DOCUMENTATION CODES:   Not applicable  INTERVENTION:   Continue Ensure Max BID, each supplement provides 150 kcal and 30 grams of protein.   D/C Juven BID, patient is not drinking.  Continue MVI with minerals daily.  NUTRITION DIAGNOSIS:   Increased nutrient needs related to wound healing as evidenced by estimated needs.  Ongoing   GOAL:   Patient will meet greater than or equal to 90% of their needs  Met with intake of meals and supplements.    MONITOR:   PO intake, Supplement acceptance, Labs, Weight trends, Skin, I & O's  REASON FOR ASSESSMENT:   Consult Assessment of nutrition requirement/status  ASSESSMENT:   55 yo male with a PMH of diabetes mellitus type 2, diabetic foot infection with prior right BKA, chronic diastolic CHF, hypertension, dyslipidemia presented with worsening left foot swelling/discoloration and discharge. Admitted with discitis of thoracic region.  DTPI to the sacrum has evolved to an unstageable pressure injury.  Current diet: Carbohydrate modified Meal intakes: 100% of all meals. Supplements: Ensure Max BID, Juven BID  Patient reports good appetite and intake of meals. He is eating his meals as well as drinking Ensure Max supplements twice daily. He is not drinking the Juven supplements, will discontinue.  Labs reviewed. Na 132 CBG: [phone]  Medications reviewed and include vitamin C, colace, novolog, levemir, MVI with minerals, Miralax.   Diet Order:   Diet Order             Diet Carb Modified Fluid consistency: Thin; Room service appropriate? Yes  Diet effective now                   EDUCATION NEEDS:   Education needs have been addressed  Skin:  Skin Assessment: Skin Integrity Issues: Skin Integrity Issues:: Unstageable Unstageable: Coccyx Incisions: L leg, closed (11/16)  Last BM:  12/8 type 2  Height:   Ht Readings from Last 1 Encounters:  07/29/21 '6\' 3"'  (1.905 m)    Weight:   Wt  Readings from Last 1 Encounters:  08/01/21 107 kg    BMI:  Body mass index is 29.48 kg/m.  Estimated Nutritional Needs:   Kcal:  2300-2500  Protein:  135-150 grams  Fluid:  >2.3 L   Lucas Mallow, RD, LDN, CNSC Please refer to Amion for contact information.

## 2021-08-14 NOTE — Progress Notes (Signed)
Pharmacy Antibiotic Note  [name] is a 55 y.o. male admitted on 07/21/2021 with GBS bacteremia related to L-DFI now s/p trans-tib amputation with progressive discitis/osteo on Vancomycin per patient preference. Now with worsening sacral wound requiring additional coverage. Pharmacy has been consulted to add Cefepime.   Plan: - Continue Vancomycin 750 mg IV every 8 hours - levels planned for tomorrow - Start Cefepime 2g IV every 8 hours - Will continue to follow renal function, culture results, LOT, and antibiotic de-escalation plans   Height: 6\' 3"  (190.5 cm) Weight: 107 kg (235 lb 14.3 oz) IBW/kg (Calculated) : 84.5  Temp (24hrs), Avg:97.8 F (36.6 C), Min:97.7 F (36.5 C), Max:98 F (36.7 C)  Recent Labs  Lab 08/09/21 0250 08/09/21 1215 08/09/21 2014 08/10/21 0408 08/11/21 0301 08/12/21 0052 08/13/21 0337 08/14/21 0110  WBC 7.4  --   --  6.2 6.6 6.6 6.2 7.3  CREATININE 1.21  --   --  1.15 1.12 1.35* 1.25*  --   VANCOTROUGH  --   --  12*  --   --   --   --   --   VANCOPEAK  --  24*  --   --   --   --   --   --     Estimated Creatinine Clearance: 88.3 mL/min (A) (by C-G formula based on SCr of 1.25 mg/dL (H)).    Allergies  Allergen Reactions   Bactrim [Sulfamethoxazole-Trimethoprim]    Ceprotin [Protein C Concentrate (Human)]    Ciprofloxacin Other (See Comments)    Kidney function   Levaquin [Levofloxacin]     11/14 Ceftriaxone x1 11/14 Vancomycin >> 11/15, 12/1>> 12/1 Cefepime>>12/5; restart 12/8 11/15 Zosyn >> 11/16 11/15 Clindamycin >>11/15 11/15 Linezolid>> 11/16 11/16 Rocephin>>11/18 11/18 Penicillin G>>12/1 12/2 Acyclovir for 7 days > 12/9  11/17 BCx: ngF 11/16 MRS PCR + 11/14 BCx: GBS pan S 11/15 UCx: GBS   Thank you for allowing pharmacy to be a part of this patient's care.  Alycia Rossetti, PharmD, BCPS Clinical Pharmacist 08/14/2021 11:17 AM   **Pharmacist phone directory can now be found on [email or web address] ([email or web address]).  Listed under [place].

## 2021-08-14 NOTE — Progress Notes (Signed)
PROGRESS NOTE  [name]  MLY:[id number] DOB: [birth year] DOA: 07/21/2021 PCP: Patient, No Pcp Per (Inactive)   Brief Narrative: 55 year old male with history of diabetes mellitus type 2, diabetic foot infection with prior right BKA, chronic diastolic CHF, hypertension, dyslipidemia presented with worsening left foot swelling/discoloration and discharge.  On presentation, he was hypotensive, tachycardic with A. fib with RVR with CT suggestive of first MTP osteomyelitis and soft tissue gas concerning for possible necrotizing fasciitis along with AKI with creatinine of 2.7 and mild DKA.  His blood pressures did not improve with IV fluids and he was transferred to ICU and PCCM was consulted.  Orthopedics was consulted.  He underwent left transtibial amputation on 07/23/2021.  He was found to have strep agalactiae bacteremia.  ID was consulted.  He was found to have vertebral discitis versus osteomyelitis on MRI.  He was also found to have new systolic dysfunction with LVEF of 35% along with new onset atrial flutter requiring IV amiodarone which has been switched to oral.  Cardiology was consulted.  He has been started on Eliquis.  S/p TEE followed by DCCV on 11/22.  Developed worsening back pain 11/26-11/27, ID reconsulted, requested MRI T/L-spine, ultimately performed 12/1, with contrast which showed Significant progression of discitis and osteomyelitis at T5 through T8. There is edema extending into the posterior elements. Extensive posterior epidural abscess has progressed since the prior study. Abscess extends from T3 through T9 and is exerting mass-effect on the cord with moderate spinal stenosis T4 through T8.   Due to report of pleural effusions on MRI, thoracentesis was attempted but there was insufficient fluid volume.   Neurosurgery, Dr. Ronnald Ramp, has evaluated the patient recommends no surgery. ID has changed antibiotics to vancomycin with plans for protracted IV abx course thru PICC. The patient  has been working less with PT and OT, so discharge to CIR may not be an option.    Assessment & Plan: Principal Problem:   Discitis of thoracic region Active Problems:   Essential hypertension   Stage 3a chronic kidney disease (HCC)   Type 2 diabetes mellitus with diabetic polyneuropathy, with long-term current use of insulin (HCC)   Right below-knee amputee (HCC)   Diabetic infection of left foot (HCC)   Chronic diastolic (congestive) heart failure (HCC)   Severe sepsis with acute organ dysfunction (HCC)   Chest pain   AKI (acute kidney injury) ([place])   Gas gangrene of foot (HCC)   Atrial flutter (HCC)   Septic shock (HCC)   Systolic dysfunction   Pressure injury of skin   MSSA bacteremia  Septic shock: Evolving on admission from bacteremia/diabetic foot infection -Off pressors.  Shock resolved. -Required care in ICU under PCCM service: Care has been transferred to [facility] from 07/27/2021 onwards   Diabetic foot infection, left: Status post left transtibial amputation on 07/23/2021. - Wound/wound VAC DC'ed per Dr. Sharol Given, recommends dry dressing.    Group B streptococcus bacteremia, progressive thoracic discitis/osteomyelitis. No vegetation on TTE or TEE during cardioversion.  MRI repeated with contrast on 12/1 showed Significant progression of discitis and osteomyelitis at T5 through T8 and extensive posterior epidural abscess has progressed since the prior study extending T3 through T9 exerting mass-effect on the cord with moderate spinal stenosis T4 through T8. - Neurosurgery reconsulted, continues to recommend IV antibiotics and advise against surgery in this difficult situation.  - Continue IV abx thru PICC (placed 12/7). Cleared blood cultures 07/24/2021.   Sacral pressure wound: Now with new blue-green exudate 12/8. D/w  ID, culture is unlikely to be helpful here. Not becoming septic, WBC and temperature remain wnl.  - Start cefepime to include pseudomonas coverage, plan shorter  course of this.  New onset systolic CHF: Echo showed EF of 35% (previous echo showed EF of 50 to 55% in 04/2021). Cardiomyopathy may be from new onset atrial flutter RVR related tachycardia versus stress induced cardiomyopathy from bacteremia and septic shock. - Cardiology no longer following, recommended torsemide 20mg  daily for now. - Continue metop succinate, bidil. AKI limiting ACE/ARB/ARNI and MRA. - HF navigator consulted.  New onset atrial flutter: Maintained NSR since DCCV 11/22 - Continue metoprolol succinate 50mg  daily. - Continue amiodarone 200mg  daily - Restart eliquis since no procedural plans, preferably uninterrupted anticoagulation for 1 month post DCCV.  T2DM uncontrolled with hyperglycemia:  - Continue basal-bolus insulin - Counseled patient regarding importance of dietary restrictions.  AKI on stage IIIa CKD: AKI resolving with limitation on diuresis. - Monitor intermittently while on vancomycin. CrCl now improved.   Pt takes testosterone/anabolic steroids (not prescription):  - Free level checked 11/29, low at 4.0. Rechecked 12/5, normal at 8.8.     Hyponatremia: Improved.   Hypomagnesemia: Improved with supplementation.  - Follow periodically.   Leukocytosis: Resolved.   Thrombocytopenia: Resolved   Generalized deconditioning - PT recommends CIR vs. SNF. Pt willing to work with therapy more.   Acute on chronic pain: Follows with Dr. Nelva Bush, pain management.  Has received steroid injections to the back which, we've confirmed, will be delayed until completion of IV antibiotics. - Reviewed [place] PDMP and patient regularly fills opioid prescriptions. Likely opioid-dependent. - Obviously has reasons for acute pain from recent surgery, discitis/osteomyelitis. - Continue multimodal pain control as ordered: - Tylenol 1 g 3 times daily. - Oxy IR 10 mg every 4 hourly as needed for moderate pain - OxyContin 15 Mg every 12 hours - Lidocaine patch increased to 2 patches -  Dilaudid 0.5 mg IV every 4 hourly as needed severe pain - Gabapentin 300 mg 3 times daily   Insomnia - Started trazodone 50 Mg at bedtime as needed on 11/24 with good effect, continue.  RN Pressure Injury Documentation: Pressure Injury 07/27/21 Sacrum Mid Unstageable - Full thickness tissue loss in which the base of the injury is covered by slough (yellow, tan, gray, green or brown) and/or eschar (tan, brown or black) in the wound bed. small pink pressure ulcer with bre (Active)  07/27/21 0255  Location: Sacrum  Location Orientation: Mid  Staging: Unstageable - Full thickness tissue loss in which the base of the injury is covered by slough (yellow, tan, gray, green or brown) and/or eschar (tan, brown or black) in the wound bed.  Wound Description (Comments): small pink pressure ulcer with break in skin 08/12/21; updated to unstageable pressure injury  Present on Admission: Yes   DVT prophylaxis: Eliquis Code Status: Full Family Communication: None at bedside Disposition Plan:  Status is: Inpatient  Remains inpatient appropriate because: Nearing medical stability, though CIR may not be an option any longer.  Consultants:  ID Neurosurgery PCCM Cardiology  Procedures:  Left TTA 07/23/2021 TEE/DCCV 07/29/2021 PICC RUE 08/13/2021  Antimicrobials: Cefazolin 11/16 Ceftriaxone 11/14, 11/16-17 Clindamycin 11/15-16 Linezolid 11/15 Pen G 11/18 Pip-tazo 11/15-11/16 Vancomycin 11/14 Cefepime 12/8 >>  Subjective: Feeling generally better and better every day, worked with PT yesterday, working on strengthening his core prior to attempting EOB/OOB. I mentioned that the plan of care as recommended by physical therapy is what I recommend he do, which  has been to get OOB to chair. He got frustrated and angry at that suggestion, asked me to leave limiting exam today. Also, despite the plan to have PICC placed has been discussed in the past, he was irritated that the PICC team arrived yesterday  evening since we didn't tell him beforehand on that day it would be happening that day. PT showed me the foam sacral pad they removed this morning for hydrotherapy which appeared blue-green and malodorous.   Objective: Vitals:   08/13/21 2030 08/14/21 0600 08/14/21 0959 08/14/21 1132  BP: 135/73 130/80 (!) 148/70 125/71  Pulse: 72 (!) 56 72 66  Resp: 16 13  20   Temp: 97.7 F (36.5 C) 98 F (36.7 C)  97.9 F (36.6 C)  TempSrc: Oral Oral  Oral  SpO2: 98% 95%  96%  Weight:      Height:        Intake/Output Summary (Last 24 hours) at 08/14/2021 1813 Last data filed at 08/14/2021 1600 Gross per 24 hour  Intake 1300 ml  Output 3050 ml  Net -1750 ml   Filed Weights   07/27/21 0252 07/29/21 0754 08/01/21 0459  Weight: 108.2 kg 108.2 kg 107 kg   Gen: 55 y.o. male in no distress Pulm: Nonlabored breathing room air. Clear. CV: Regular rate and rhythm. No murmur, rub, or gallop. No JVD, no dependent edema. GI: Abdomen soft, non-tender, non-distended, with normoactive bowel sounds.  Ext: Warm, stable LE amputations, muscular atrophy noted bilateral LE's. Skin: Sacral wound not reexamined today. Neuro: Alert and oriented. No focal neurological deficits. Psych: Judgement and insight appear fair, Irritable.  Data Reviewed: I have personally reviewed following labs and imaging studies  CBC: Recent Labs  Lab 08/10/21 0408 08/11/21 0301 08/12/21 0052 08/13/21 0337 08/14/21 0110  WBC 6.2 6.6 6.6 6.2 7.3  HGB 12.4* 12.9* 13.2 12.8* 12.9*  HCT 40.0 40.1 41.2 40.8 40.6  MCV 91.5 90.3 90.5 90.5 90.8  PLT 328 301 319 284 893   Basic Metabolic Panel: Recent Labs  Lab 08/08/21 0204 08/09/21 0250 08/10/21 0408 08/11/21 0301 08/12/21 0052 08/13/21 0337  NA 129* 133* 132* 133* 132* 132*  K 4.2 3.8 4.3 4.2 4.1 4.2  CL 94* 100 100 102 99 101  CO2 27 26 24 24 24 24   GLUCOSE 288* 216* 229* 181* 224* 263*  BUN 37* 27* 29* 25* 32* 27*  CREATININE 1.54* 1.21 1.15 1.12 1.35* 1.25*   CALCIUM 7.8* 7.9* 7.9* 8.1* 8.3* 8.1*  MG 1.6* 1.4*  --  1.6* 1.7  --    GFR: Estimated Creatinine Clearance: 88.3 mL/min (A) (by C-G formula based on SCr of 1.25 mg/dL (H)). Liver Function Tests: Recent Labs  Lab 08/13/21 0337  AST 22  ALT 35  ALKPHOS 95  BILITOT 0.4  PROT 5.6*  ALBUMIN 1.6*   No results for input(s): LIPASE, AMYLASE in the last 168 hours. No results for input(s): AMMONIA in the last 168 hours. Coagulation Profile: No results for input(s): INR, PROTIME in the last 168 hours. Cardiac Enzymes: No results for input(s): CKTOTAL, CKMB, CKMBINDEX, TROPONINI in the last 168 hours. BNP (last 3 results) No results for input(s): PROBNP in the last 8760 hours. HbA1C: No results for input(s): HGBA1C in the last 72 hours. CBG: Recent Labs  Lab 08/13/21 2159 08/14/21 0739 08/14/21 1017 08/14/21 1151 08/14/21 1619  GLUCAP 180* 97 206* 151* 125*   Lipid Profile: No results for input(s): CHOL, HDL, LDLCALC, TRIG, CHOLHDL, LDLDIRECT in the last 72  hours. Thyroid Function Tests: No results for input(s): TSH, T4TOTAL, FREET4, T3FREE, THYROIDAB in the last 72 hours. Anemia Panel: No results for input(s): VITAMINB12, FOLATE, FERRITIN, TIBC, IRON, RETICCTPCT in the last 72 hours. Urine analysis:    Component Value Date/Time   COLORURINE YELLOW 07/22/2021 0250   APPEARANCEUR CLOUDY (A) 07/22/2021 0250   LABSPEC 1.018 07/22/2021 0250   PHURINE 5.0 07/22/2021 0250   GLUCOSEU >=500 (A) 07/22/2021 0250   HGBUR SMALL (A) 07/22/2021 0250   BILIRUBINUR NEGATIVE 07/22/2021 0250   KETONESUR 5 (A) 07/22/2021 0250   PROTEINUR 30 (A) 07/22/2021 0250   UROBILINOGEN 0.2 09/13/2019 0906   NITRITE NEGATIVE 07/22/2021 0250   LEUKOCYTESUR TRACE (A) 07/22/2021 0250   No results found for this or any previous visit (from the past 240 hour(s)).    Radiology Studies: IR US CHEST  Result Date: 08/13/2021 CLINICAL DATA:  Bilateral pleural effusions by prior CT. EXAM: CHEST  ULTRASOUND COMPARISON:  CT of the chest on 08/10/2021 FINDINGS: By ultrasound, there are only very small bilateral pleural effusions. There was not enough fluid present to allow for safe thoracentesis at this time. IMPRESSION: Small volume bilateral pleural effusions. There was not enough fluid present to warrant thoracentesis currently. Electronically Signed   By: Aletta Edouard M.D.   On: 08/13/2021 12:59   [place] EKG SITE RITE  Result Date: 08/13/2021 If Site Rite image not attached, placement could not be confirmed due to current cardiac rhythm.   Scheduled Meds:  acetaminophen  1,000 mg Oral TID   acyclovir  400 mg Oral TID   amiodarone  200 mg Oral Daily   apixaban  5 mg Oral BID   vitamin C  1,000 mg Oral Daily   Chlorhexidine Gluconate Cloth  6 each Topical Daily   collagenase   Topical Daily   docusate sodium  100 mg Oral Daily   gabapentin  300 mg Oral TID   insulin aspart  0-15 Units Subcutaneous TID WC   insulin aspart  0-5 Units Subcutaneous QHS   insulin aspart  6 Units Subcutaneous TID WC   insulin detemir  30 Units Subcutaneous BID   isosorbide mononitrate  30 mg Oral Daily   lidocaine  2 patch Transdermal Q24H   methocarbamol  1,000 mg Oral Q8H   metoprolol succinate  50 mg Oral Daily   multivitamin with minerals  1 tablet Oral Daily   nutrition supplement (JUVEN)  1 packet Oral BID BM   oxyCODONE  15 mg Oral Q12H   polyethylene glycol  17 g Oral Daily   Ensure Max Protein  11 oz Oral BID BM   sodium chloride flush  10-40 mL Intracatheter Q12H   torsemide  20 mg Oral QODAY   Continuous Infusions:  sodium chloride Stopped (08/13/21 0457)   ceFEPime (MAXIPIME) IV 2 g (08/14/21 1313)   vancomycin 750 mg (08/14/21 1111)     LOS: 23 days   Time spent: 25 minutes.  Patrecia Pour, MD [facility] [email or web address] 08/14/2021, 6:13 PM

## 2021-08-14 NOTE — Progress Notes (Signed)
Heart Failure Nurse Navigator Progress Note  Attempted to complete HV TOC assess to determine clinic eligibility. Upon entry to room, pt resting with eyes close. Navigator attempted to introduce self and position; pt stopped conversation and stated for me to leave.   Will attempt to complete interview at a later time. HF Pharmacy may follow to guide recommendations for GDMT.   Pricilla Holm, MSN, RN Heart Failure Nurse Navigator [phone]

## 2021-08-14 NOTE — Progress Notes (Signed)
Physical Therapy Wound Treatment Patient Details  Name: [name] MRN: [id number] Date of Birth: 08/22/1966  Today's Date: 08/14/2021 Time: 1015-1058 Time Calculation (min): 43 min  Subjective  Subjective Assessment Subjective: I can't control my pee.. Patient and Family Stated Goals: Get well and get out of here... Date of Onset:  (unknown) Prior Treatments:  (unknown)  Pain Score:  2/10 with premedication  Wound Assessment  Pressure Injury 07/27/21 Sacrum Mid Unstageable - Full thickness tissue loss in which the base of the injury is covered by slough (yellow, tan, gray, green or brown) and/or eschar (tan, brown or black) in the wound bed. small pink pressure ulcer with bre (Active)  Wound Image   08/13/21 1440  Dressing Type Barrier Film (skin prep);Foam - Lift dressing to assess site every shift;Gauze (Comment);Moist to dry;Normal saline moist dressing;Santyl;Other (Comment) 08/14/21 1754  Dressing Changed;Dry;Intact;Clean 08/14/21 1754  Dressing Change Frequency Daily 08/14/21 1754  State of Healing Eschar 08/14/21 1754  Site / Wound Assessment Red;Yellow 08/14/21 1754  % Wound base Red or Granulating 30% 08/14/21 1754  % Wound base Yellow/Fibrinous Exudate 20% 08/14/21 1754  % Wound base Black/Eschar 50% 08/14/21 1754  % Wound base Other/Granulation Tissue (Comment) 0% 08/14/21 1754  Peri-wound Assessment Excoriated;Intact 08/14/21 1754  Wound Length (cm) 7 cm 08/13/21 1440  Wound Width (cm) 6 cm 08/13/21 1440  Wound Depth (cm) 0 cm 08/13/21 1440  Wound Surface Area (cm^2) 42 cm^2 08/13/21 1440  Wound Volume (cm^3) 0 cm^3 08/13/21 1440  Margins Unattached edges (unapproximated) 08/14/21 1754  Drainage Amount Minimal 08/14/21 1754  Drainage Description Green;Other (Comment) 08/14/21 1754  Treatment Cleansed;Debridement (Selective);Hydrotherapy (Pulse lavage);Packing (Saline gauze) 08/14/21 1754      Hydrotherapy Pulsed lavage therapy - wound location: sacral Pulsed  Lavage with Suction (psi): 8 psi (8=12) Pulsed Lavage with Suction - Normal Saline Used: 1000 mL Pulsed Lavage Tip: Tip with splash shield Selective Debridement Selective Debridement - Location: sacral Selective Debridement - Tools Used: Forceps, Scalpel Selective Debridement - Tissue Removed: cross-hatched sacral eschar    Wound Assessment and Plan  Wound Therapy - Assess/Plan/Recommendations Wound Therapy - Clinical Statement: pt benefiting from hydrotherapy for pulsed lavage to cleanse and soften wound/eschar for selective debridement and to decrease bio burden.  Urinary incontinence measures improved.  Started fungal powder.  Now with blue green exudate suggestive of pseudomonas.  MD notified. Wound Therapy - Functional Problem List: up to 3 weeks or more relatively immobile in bed. Factors Delaying/Impairing Wound Healing: Diabetes Mellitus, Incontinence, Infection - systemic/local, Immobility, Multiple medical problems Hydrotherapy Plan: Debridement, Dressing change, Patient/family education, Pulsatile lavage with suction Wound Therapy - Frequency: 6X / week Wound Therapy - Current Recommendations: PT Wound Therapy - Follow Up Recommendations: dressing changes by family/patient, dressing changes by RN  Wound Therapy Goals- Improve the function of patient's integumentary system by progressing the wound(s) through the phases of wound healing (inflammation - proliferation - remodeling) by: Wound Therapy Goals - Improve the function of patient's integumentary system by progressing the wound(s) through the phases of wound healing by: Decrease Necrotic Tissue to: 20% Decrease Necrotic Tissue - Progress: Progressing toward goal Increase Granulation Tissue to: 80% Increase Granulation Tissue - Progress: Progressing toward goal Goals/treatment plan/discharge plan were made with and agreed upon by patient/family: Yes Time For Goal Achievement: 7 days Wound Therapy - Potential for Goals:  Good  Goals will be updated until maximal potential achieved or discharge criteria met.  Discharge criteria: when goals achieved, discharge from hospital, MD decision/surgical intervention,  no progress towards goals, refusal/missing three consecutive treatments without notification or medical reason.  GP     Charges PT Wound Care Charges $Wound Debridement up to 20 cm: < or equal to 20 cm $ Wound Debridement each add'l 20 sqcm: 1 $PT Hydrotherapy Dressing: 1 dressing $PT PLS Gun and Tip: 1 Supply $PT Hydrotherapy Visit: 1 Visit       [name] Torrey Horseman 08/14/2021, 6:00 PM 08/14/2021  [name]., PT Acute Rehabilitation Services [phone]  (pager) [phone]  (office)

## 2021-08-15 DIAGNOSIS — L89300 Pressure ulcer of unspecified buttock, unstageable: Secondary | ICD-10-CM | POA: Diagnosis not present

## 2021-08-15 DIAGNOSIS — E11628 Type 2 diabetes mellitus with other skin complications: Secondary | ICD-10-CM | POA: Diagnosis not present

## 2021-08-15 DIAGNOSIS — I10 Essential (primary) hypertension: Secondary | ICD-10-CM | POA: Diagnosis not present

## 2021-08-15 DIAGNOSIS — N179 Acute kidney failure, unspecified: Secondary | ICD-10-CM | POA: Diagnosis not present

## 2021-08-15 DIAGNOSIS — I4892 Unspecified atrial flutter: Secondary | ICD-10-CM | POA: Diagnosis not present

## 2021-08-15 DIAGNOSIS — E1142 Type 2 diabetes mellitus with diabetic polyneuropathy: Secondary | ICD-10-CM | POA: Diagnosis not present

## 2021-08-15 DIAGNOSIS — M4644 Discitis, unspecified, thoracic region: Secondary | ICD-10-CM | POA: Diagnosis not present

## 2021-08-15 DIAGNOSIS — I5032 Chronic diastolic (congestive) heart failure: Secondary | ICD-10-CM | POA: Diagnosis not present

## 2021-08-15 DIAGNOSIS — L089 Local infection of the skin and subcutaneous tissue, unspecified: Secondary | ICD-10-CM | POA: Diagnosis not present

## 2021-08-15 DIAGNOSIS — R079 Chest pain, unspecified: Secondary | ICD-10-CM | POA: Diagnosis not present

## 2021-08-15 DIAGNOSIS — R7881 Bacteremia: Secondary | ICD-10-CM | POA: Diagnosis not present

## 2021-08-15 DIAGNOSIS — Z794 Long term (current) use of insulin: Secondary | ICD-10-CM | POA: Diagnosis not present

## 2021-08-15 DIAGNOSIS — A480 Gas gangrene: Secondary | ICD-10-CM | POA: Diagnosis not present

## 2021-08-15 LAB — BASIC METABOLIC PANEL
Anion gap: 8 (ref 5–15)
BUN: 23 mg/dL — ABNORMAL HIGH (ref 6–20)
CO2: 25 mmol/L (ref 22–32)
Calcium: 8.3 mg/dL — ABNORMAL LOW (ref 8.9–10.3)
Chloride: 100 mmol/L (ref 98–111)
Creatinine, Ser: 1.14 mg/dL (ref 0.61–1.24)
GFR, Estimated: >60 mL/min (ref >60)
Glucose, Bld: 268 mg/dL — ABNORMAL HIGH (ref 70–99)
Potassium: 4 mmol/L (ref 3.5–5.1)
Sodium: 133 mmol/L — ABNORMAL LOW (ref 135–145)

## 2021-08-15 LAB — GLUCOSE, CAPILLARY
Glucose-Capillary: 225 mg/dL — ABNORMAL HIGH (ref 70–99)
Glucose-Capillary: 150 mg/dL — ABNORMAL HIGH (ref 70–99)
Glucose-Capillary: 244 mg/dL — ABNORMAL HIGH (ref 70–99)
Glucose-Capillary: 113 mg/dL — ABNORMAL HIGH (ref 70–99)

## 2021-08-15 LAB — VANCOMYCIN, TROUGH: Vancomycin Tr: 14 ug/mL — ABNORMAL LOW (ref 15–20)

## 2021-08-15 LAB — MAGNESIUM: Magnesium: 1.5 mg/dL — ABNORMAL LOW (ref 1.7–2.4)

## 2021-08-15 MED ORDER — NON FORMULARY: Status: DC | PRN

## 2021-08-15 MED ORDER — DAKINS (1/4 STRENGTH) 0.125 % EX SOLN
Freq: Every day | CUTANEOUS | Status: AC
  Filled 2021-08-15 (×2): qty 473

## 2021-08-15 MED ORDER — DAKINS (1/4 STRENGTH) 0.125 % EX SOLN
Freq: Every day | CUTANEOUS | Status: DC
  Filled 2021-08-15: qty 473

## 2021-08-15 MED ORDER — MAGNESIUM SULFATE 4 GM/100ML IV SOLN
4.0000 g | Freq: Once | INTRAVENOUS | Status: AC
  Administered 2021-08-15: 4 g via INTRAVENOUS
  Filled 2021-08-15: qty 100

## 2021-08-15 NOTE — Progress Notes (Signed)
Physical Therapy Treatment Patient Details Name: [name] MRN: [id number] DOB: August 25, 1966 Today's Date: 08/15/2021   History of Present Illness Pt is 55 yo admitted 11/14 with infected left foot s/p Left BKA 11/16. Pt with T6-8 discitis. New onset CHF and aflutter.  CT 12/4 with pleural effusion. PMhx: Rt BKA, Aflutter, DM, depression and anxiety    PT Comments    Pt is beginning to make great progress with mobility. While he still has significant low back pain impacting his mobility, he is motivated to improve and push through the pain. Pt is very compliant with his core and lower extremity HEP provided in prior sessions to ideally help him progress with mobility. Pt was able to transition supine > sit R EOB with maxAx2 and sit EOB statically and with short dynamic periods x ~12 min today, needing minA initially but mod-maxA posteriorly as he fatigued. Educated pt on importance of continued progress and to elevate the [facility] gradually as his HEP to promote improve upright tolerance, pt verbalizing understanding. Pt adamant against going to a SNF and understands he needs to progress his tolerance to activity to be a good candidate for inpatient rehab. Will continue to follow acutely. In light of his significant progress, motivation to improve, drastic change in functional status from baseline, and improved activity tolerance he would greatly benefit from inpatient rehab at d/c.    Recommendations for follow up therapy are one component of a multi-disciplinary discharge planning process, led by the attending physician.  Recommendations may be updated based on patient status, additional functional criteria and insurance authorization.  Follow Up Recommendations  Acute inpatient rehab (3hours/day)     Assistance Recommended at Discharge Frequent or constant Supervision/Assistance  Equipment Recommendations  Wheelchair (measurements PT);Wheelchair cushion (measurements PT);Other (comment)  (bilateral BKA wheelchair assessment, may need slide board and drop arm BSC?; further assessment post acute)    Recommendations for Other Services       Precautions / Restrictions Precautions Precautions: Fall;Back Precaution Comments: back prec for comfort Required Braces or Orthoses: Other Brace Other Brace: LLE limb protector, R BK prosthesis Restrictions Weight Bearing Restrictions: Yes LLE Weight Bearing: Non weight bearing Other Position/Activity Restrictions: RLE prosthetic is not fitting currently per pt     Mobility  Bed Mobility Overal bed mobility: Needs Assistance Bed Mobility: Rolling;Sidelying to Sit;Sit to Supine Rolling: Supervision Sidelying to sit: Max assist;+2 for safety/equipment;+2 for physical assistance;HOB elevated (slightly elevated)   Sit to supine: Mod assist;+2 for safety/equipment;+2 for physical assistance   General bed mobility comments: Pt moving very slowly with bed mobility due to pain, but still pushing through it to sit EOB. Pt requesting to exit R EOB, using bed rail to roll with Supervision. Used overhead trapeze and R bed rail to ascend trunk and scoot hips to EOB, needing modA initially for trunk but maxAx2 to complete trunk ascension with support at posterior aspect of trunk and pt pulling on OT's hand. ModAx2 to manage trunk and hips to return to supine at slow pace to manage pain.    Transfers                   General transfer comment: Unable at this time    Ambulation/Gait               General Gait Details: Unable   Stairs             Wheelchair Mobility    Modified Rankin (Stroke Patients Only)  Balance Overall balance assessment: Needs assistance Sitting-balance support: Bilateral upper extremity supported;Single extremity supported;Feet supported Sitting balance-Leahy Scale: Poor Sitting balance - Comments: Pt sitting up EOB x ~12 min total this date, initially needing minA posteriorly and  at his R lateral trunk for support as he tends to lean posteriorly to manage his back pain. However, as time in sitting progressed he needed up to mod-maxA, displaying increased R lateral trunk flexion. Pt reaching off BOS and across midline with 1 UE at a time to reach targets, 20x each arm, modA for support.       Standing balance comment: Unable                            Cognition Arousal/Alertness: Awake/alert Behavior During Therapy: WFL for tasks assessed/performed Overall Cognitive Status: Within Functional Limits for tasks assessed                                 General Comments: Pt does better with gentle encouragement and pushing towards next acheivement towards ultimate goal to get OOB.        Exercises Other Exercises Other Exercises: Reaching off BOS and across midline minimally while sitting EOB with modA posterior support to reach targets, 20x each UE, to improve core strength and activation    General Comments General comments (skin integrity, edema, etc.): Educated pt on importance of making gradual progress towards goal to get OOB to potentially be reconsidered for CIR again as he is adament he will not go to a SNF. Pt verbalized understanding, making goal to sit more upright on EOB next session. Pt verbalized understanding of trunk and leg HEP. Educated pt to progress HOB elevation as his HEP      Pertinent Vitals/Pain Pain Assessment: Faces Faces Pain Scale: Hurts whole lot Pain Location: low back pain Pain Descriptors / Indicators: Grimacing;Guarding;Moaning Pain Intervention(s): Monitored during session;Limited activity within patient's tolerance;Repositioned    Home Living                          Prior Function            PT Goals (current goals can now be found in the care plan section) Acute Rehab PT Goals Patient Stated Goal: to go to CIR PT Goal Formulation: With patient Time For Goal Achievement:  08/20/21 Potential to Achieve Goals: Fair Progress towards PT goals: Progressing toward goals    Frequency    Min 3X/week      PT Plan Current plan remains appropriate    Co-evaluation PT/OT/SLP Co-Evaluation/Treatment: Yes Reason for Co-Treatment: Complexity of the patient's impairments (multi-system involvement);For patient/therapist safety;To address functional/ADL transfers PT goals addressed during session: Mobility/safety with mobility;Balance        AM-PAC PT "6 Clicks" Mobility   Outcome Measure  Help needed turning from your back to your side while in a flat bed without using bedrails?: A Little Help needed moving from lying on your back to sitting on the side of a flat bed without using bedrails?: Total Help needed moving to and from a bed to a chair (including a wheelchair)?: Total Help needed standing up from a chair using your arms (e.g., wheelchair or bedside chair)?: Total Help needed to walk in hospital room?: Total Help needed climbing 3-5 steps with a railing? : Total 6 Click Score: 8  End of Session   Activity Tolerance: Patient tolerated treatment well;Patient limited by pain Patient left: in bed;with call bell/phone within reach Nurse Communication: Mobility status PT Visit Diagnosis: Other abnormalities of gait and mobility (R26.89);Muscle weakness (generalized) (M62.81);Pain Pain - part of body:  (back)     Time: 3748-2707 PT Time Calculation (min) (ACUTE ONLY): 27 min  Charges:  $Therapeutic Activity: 8-22 mins                     Moishe Spice, PT, DPT Acute Rehabilitation Services  Pager: [phone] Office: [place] 08/15/2021, 9:36 AM

## 2021-08-15 NOTE — Progress Notes (Signed)
[facility] for Infect[facility] Disease  Date of Admission:  07/21/2021           Reason for visit: Follow up on epidural abscess  Current antibiotics: Vancomycin Cefepime  ASSESSMENT:    55 y.o. male admitted with:  Group B strep bacteremia: Complicated by diabetic foot infection/osteomyelitis and status post left BKA 07/23/2021.  This infection has also been complicated by the development of epidural abscess.  He had a repeat MRI on 08/07/2021 that showed significant progression of the discitis/osteomyelitis at T5-T8 and extensive posterior epidural abscess extending from T3-T9 with mass-effect on the cord and moderate spinal stenosis T4-T8.  There is no plans for surgical intervention at this time and patient feels improved.  He underwent TEE earlier this admission that was negative for vegetation and his blood cultures cleared as of 07/24/2021. Sacral wound: Followed by wound care and receiving hydrotherapy.  Noted to have increased blue-green exudate yesterday and started on cefepime as a result. Uncontrolled diabetes: A1c is 13.8. Acute kidney injury on CKD stage III: Creatinine has improved and will continue to be monitored intermittently while on vancomycin. Hx of right BKA.   RECOMMENDATIONS:    Continue vancomycin per pharmacy per patient preference for GBS infection.  Please see prior notes regarding conversations on penicillin for treatment. Continue cefepime for superficial sacral wound infection.  Anticipate duration of 5 days in order to treat a superficial process. Continue wound care, glycemic control, lab monitoring Close monitoring of neuro exam Will need prolonged antibiotic course with interval imaging to be determined Will follow   Principal Problem:   Discitis of thoracic region Active Problems:   Essential hypertension   Stage 3a chronic kidney disease (HCC)   Type 2 diabetes mellitus with diabetic polyneuropathy, with long-term current use of insulin  (HCC)   Right below-knee amputee (HCC)   Diabetic infection of left foot (HCC)   Chronic diastolic (congestive) heart failure (HCC)   Severe sepsis with acute organ dysfunction (HCC)   Chest pain   AKI (acute kidney injury) ([place])   Gas gangrene of foot (HCC)   Atrial flutter (HCC)   Septic shock (HCC)   Systolic dysfunction   Pressure injury of skin   MSSA bacteremia    MEDICATIONS:    Scheduled Meds: . acetaminophen  1,000 mg Oral TID  . amiodarone  200 mg Oral Daily  . apixaban  5 mg Oral BID  . vitamin C  1,000 mg Oral Daily  . Chlorhexidine Gluconate Cloth  6 each Topical Daily  . collagenase   Topical Daily  . docusate sodium  100 mg Oral Daily  . gabapentin  300 mg Oral TID  . insulin aspart  0-15 Units Subcutaneous TID WC  . insulin aspart  0-5 Units Subcutaneous QHS  . insulin aspart  6 Units Subcutaneous TID WC  . insulin detemir  30 Units Subcutaneous BID  . isosorbide mononitrate  30 mg Oral Daily  . lidocaine  2 patch Transdermal Q24H  . methocarbamol  1,000 mg Oral Q8H  . metoprolol succinate  50 mg Oral Daily  . multivitamin with minerals  1 tablet Oral Daily  . nutrition supplement (JUVEN)  1 packet Oral BID BM  . oxyCODONE  15 mg Oral Q12H  . polyethylene glycol  17 g Oral Daily  . Ensure Max Protein  11 oz Oral BID BM  . sodium chloride flush  10-40 mL Intracatheter Q12H  . torsemide  20 mg Oral QODAY  Continuous Infusions: . sodium chloride Stopped (08/13/21 0457)  . ceFEPime (MAXIPIME) IV 2 g (08/15/21 0746)  . vancomycin 750 mg (08/15/21 0528)   PRN Meds:.alum & mag hydroxide-simeth, bisacodyl, diphenhydrAMINE, guaiFENesin-dextromethorphan, HYDROmorphone (DILAUDID) injection, liver oil-zinc oxide, ondansetron, oxyCODONE, phenol, polyethylene glycol, sodium chloride flush, traZODone  SUBJECTIVE:   24 hour events:  Afebrile T-max 98.6 Stable creatinine No new micro Patient noted to have blue-green exudate from his sacral wound yesterday  during hydrotherapy.  Due to concern for secondary infection he was started on cefepime to include pseudomonal coverage  No new specific complaints today.  Continues to be frustrated about his urinary incontinence and not having a Foley catheter.  Also frustrated by the amount of time he had to spend on the bedside pan waiting for nursing to assist him.  He otherwise is happy with vancomycin and reports a good day working with therapy today.  Review of Systems  All other systems reviewed and are negative.    OBJECTIVE:   Blood pressure 113/74, pulse 74, temperature 97.8 F (36.6 C), temperature source Oral, resp. rate 20, height 6\' 3"  (1.905 m), weight 107 kg, SpO2 92 %. Body mass index is 29.48 kg/m.  Physical Exam Constitutional:      General: He is not in acute distress.    Appearance: Normal appearance.  Pulmonary:     Effort: Pulmonary effort is normal. No respiratory distress.  Musculoskeletal:     Comments: Bilateral BKA Moving upper extremities without deficits Right upper extremity PICC line in place  Skin:    General: Skin is warm and dry.     Findings: No rash.  Neurological:     General: No focal deficit present.     Mental Status: He is alert and oriented to person, place, and time.  Psychiatric:        Mood and Affect: Mood normal.        Behavior: Behavior normal.     Lab Results: Lab Results  Component Value Date   WBC 7.3 08/14/2021   HGB 12.9 (L) 08/14/2021   HCT 40.6 08/14/2021   MCV 90.8 08/14/2021   PLT 321 08/14/2021    Lab Results  Component Value Date   NA 133 (L) 08/15/2021   K 4.0 08/15/2021   CO2 25 08/15/2021   GLUCOSE 268 (H) 08/15/2021   BUN 23 (H) 08/15/2021   CREATININE 1.14 08/15/2021   CALCIUM 8.3 (L) 08/15/2021   GFRNONAA >60 08/15/2021   GFRAA 57 (L) 09/13/2019    Lab Results  Component Value Date   ALT 35 08/13/2021   AST 22 08/13/2021   ALKPHOS 95 08/13/2021   BILITOT 0.4 08/13/2021       Component Value  Date/Time   CRP 1.4 (H) 08/13/2021 0337       Component Value Date/Time   ESRSEDRATE 37 (H) 08/13/2021 7062     I have reviewed the micro and lab results in Epic.  Imaging: [place] EKG SITE RITE  Result Date: 08/13/2021 If Site Rite image not attached, placement could not be confirmed due to current cardiac rhythm.    Imaging independently reviewed in Epic.    Rayn[facility] [phone] pager 08/15/2021, 10:25 AM  I spent greater than 35 minutes with the patient including greater than 50% of time in face to face counsel of the patient and in coordination of their care.

## 2021-08-15 NOTE — Progress Notes (Signed)
PROGRESS NOTE  [name]  KGM:[id number] DOB: 06/22/1966 DOA: 07/21/2021 PCP: Patient, No Pcp Per (Inactive)   Brief Narrative: 55 year old male with history of diabetes mellitus type 2, diabetic foot infection with prior right BKA, chronic diastolic CHF, hypertension, dyslipidemia presented with worsening left foot swelling/discoloration and discharge.  On presentation, he was hypotensive, tachycardic with A. fib with RVR with CT suggestive of first MTP osteomyelitis and soft tissue gas concerning for possible necrotizing fasciitis along with AKI with creatinine of 2.7 and mild DKA.  His blood pressures did not improve with IV fluids and he was transferred to ICU and PCCM was consulted.  Orthopedics was consulted.  He underwent left transtibial amputation on 07/23/2021.  He was found to have strep agalactiae bacteremia.  ID was consulted.  He was found to have vertebral discitis versus osteomyelitis on MRI.  He was also found to have new systolic dysfunction with LVEF of 35% along with new onset atrial flutter requiring IV amiodarone which has been switched to oral.  Cardiology was consulted.  He has been started on Eliquis.  S/p TEE followed by DCCV on 11/22.  Developed worsening back pain 11/26-11/27, ID reconsulted, requested MRI T/L-spine, ultimately performed 12/1, with contrast which showed Significant progression of discitis and osteomyelitis at T5 through T8. There is edema extending into the posterior elements. Extensive posterior epidural abscess has progressed since the prior study. Abscess extends from T3 through T9 and is exerting mass-effect on the cord with moderate spinal stenosis T4 through T8.   Due to report of pleural effusions on MRI, thoracentesis was attempted but there was insufficient fluid volume.   Neurosurgery, Dr. Ronnald Ramp, has evaluated the patient recommends no surgery. ID has changed antibiotics to vancomycin with plans for protracted IV abx course thru PICC. The patient  has been working less with PT and OT, so discharge to CIR may not be an option.    Assessment & Plan: Principal Problem:   Discitis of thoracic region Active Problems:   Essential hypertension   Stage 3a chronic kidney disease (HCC)   Type 2 diabetes mellitus with diabetic polyneuropathy, with long-term current use of insulin (HCC)   Right below-knee amputee (HCC)   Diabetic infection of left foot (HCC)   Chronic diastolic (congestive) heart failure (HCC)   Severe sepsis with acute organ dysfunction (HCC)   Chest pain   AKI (acute kidney injury) ([place])   Gas gangrene of foot (HCC)   Atrial flutter (HCC)   Septic shock (HCC)   Systolic dysfunction   Pressure injury of skin   MSSA bacteremia  Septic shock: Evolving on admission from bacteremia/diabetic foot infection -Off pressors.  Shock resolved.   Diabetic left foot infection: Status post left transtibial amputation on 07/23/2021. - Wound/wound VAC DC'ed per Dr. Sharol Given, recommends dry dressing.    Group B streptococcus bacteremia, progressive thoracic discitis/osteomyelitis. No vegetation on TTE or TEE during cardioversion.  MRI repeated with contrast on 12/1 showed Significant progression of discitis and osteomyelitis at T5 through T8 and extensive posterior epidural abscess has progressed since the prior study extending T3 through T9 exerting mass-effect on the cord with moderate spinal stenosis T4 through T8. - Neurosurgery consulted, continues to recommend IV antibiotics and advise against surgery in this difficult situation. Fortunately, there is clinical improvement with this strategy. - Continue IV abx thru PICC (placed 12/7). Total duration TBD. Cleared blood cultures 07/24/2021.   Sacral pressure wound: Now with new blue-green exudate 12/8. D/w ID, culture is unlikely to be  helpful here. Not becoming septic, WBC and temperature remain wnl.  - Started cefepime to include pseudomonas coverage, plan 5 days.  New onset  systolic CHF: Echo showed EF of 35% (previous echo showed EF of 50 to 55% in 04/2021). Cardiomyopathy may be from new onset atrial flutter RVR related tachycardia versus stress induced cardiomyopathy from bacteremia and septic shock. - Cardiology no longer following, recommended torsemide 20mg  daily for now. - Continue metop succinate, bidil. AKI limiting ACE/ARB/ARNI and MRA. - HF navigator consulted.  New onset atrial flutter: Maintained NSR since DCCV 11/22 - Continue metoprolol succinate 50mg  daily. - Continue amiodarone 200mg  daily - Restart eliquis since no procedural plans, preferably uninterrupted anticoagulation for 1 month post DCCV.  T2DM uncontrolled with hyperglycemia:  - Continue basal-bolus insulin  AKI on stage IIIa CKD: AKI resolving with limitation on diuresis. - Monitor intermittently while on vancomycin. CrCl now improved.   Pt takes testosterone/anabolic steroids (not prescription):  - Free level checked 11/29, low at 4.0. Rechecked 12/5, normal at 8.8.   Hyponatremia: Improved.   Hypomagnesemia: Improved with supplementation.  - Follow periodically.   Leukocytosis: Resolved.   Thrombocytopenia: Resolved   Generalized deconditioning - PT recommends CIR vs. SNF. Pt making gains and working with PT. We discussed with CIR who will follow the patient for admission once he meets criteria. Unable to DC to SNF while undergoing hydrotherapy.   Acute on chronic pain: Follows with Dr. Nelva Bush, pain management.  Has received steroid injections to the back which, we've confirmed, will be delayed until completion of IV antibiotics. - Reviewed [place] PDMP and patient regularly fills opioid prescriptions. Likely opioid-dependent. - Obviously has reasons for acute pain from recent surgery, discitis/osteomyelitis. - Continue multimodal pain control as ordered: - Tylenol 1 g 3 times daily. - Oxy IR 10 mg every 4 hourly as needed for moderate pain - OxyContin 15 Mg every 12 hours -  Lidocaine patch increased to 2 patches - Dilaudid 0.5 mg IV every 4 hourly as needed severe pain. D/w RN that IV narcotic is no longer indicated, will discuss further with patient but encourage discontinuation of this. - Gabapentin 300 mg 3 times daily   Insomnia - Started trazodone 50 Mg at bedtime as needed on 11/24 with good effect, continue.  RN Pressure Injury Documentation: Pressure Injury 07/27/21 Sacrum Mid Unstageable - Full thickness tissue loss in which the base of the injury is covered by slough (yellow, tan, gray, green or brown) and/or eschar (tan, brown or black) in the wound bed. small pink pressure ulcer with bre (Active)  07/27/21 0255  Location: Sacrum  Location Orientation: Mid  Staging: Unstageable - Full thickness tissue loss in which the base of the injury is covered by slough (yellow, tan, gray, green or brown) and/or eschar (tan, brown or black) in the wound bed.  Wound Description (Comments): small pink pressure ulcer with break in skin 08/12/21; updated to unstageable pressure injury  Present on Admission: Yes   DVT prophylaxis: Eliquis Code Status: Full Family Communication: None at bedside Disposition Plan:  Status is: Inpatient  Remains inpatient appropriate because: Unsafe DC. Needs intensive PT, though SNF cannot take him while pursuing hydrotherapy.  Consultants:  ID Neurosurgery PCCM Cardiology  Procedures:  Left TTA 07/23/2021 TEE/DCCV 07/29/2021 PICC RUE 08/13/2021  Antimicrobials: Cefazolin 11/16 Ceftriaxone 11/14, 11/16-17 Clindamycin 11/15-16 Linezolid 11/15 Pen G 11/18 Pip-tazo 11/15-11/16 Vancomycin 11/14 Cefepime 12/8 >>  Subjective: Made great strides with PT today, got up EOB for >10 minutes with manageable  pain. Motivated to continue improving functional status in interest of leaving hospital. No new complaints.   Objective: Vitals:   08/14/21 2220 08/15/21 0400 08/15/21 0929 08/15/21 1004  BP: (!) 147/65 140/62  113/74   Pulse: 64 60 74 74  Resp: 19 20    Temp: 98.6 F (37 C) 98.1 F (36.7 C) 97.8 F (36.6 C)   TempSrc: Oral Oral Oral   SpO2: 92% 92%    Weight:      Height:        Intake/Output Summary (Last 24 hours) at 08/15/2021 1355 Last data filed at 08/15/2021 1300 Gross per 24 hour  Intake 1403.52 ml  Output 2302 ml  Net -898.48 ml   Filed Weights   07/27/21 0252 07/29/21 0754 08/01/21 0459  Weight: 108.2 kg 108.2 kg 107 kg   Gen: 55 y.o. male in no distress Pulm: Nonlabored breathing room air. Clear. CV: Regular rate and rhythm. No murmur, rub, or gallop. No JVD, no dependent edema. GI: Abdomen soft, non-tender, non-distended, with normoactive bowel sounds.  Ext: Warm, atrophic amputated LE's Skin: No new rashes, lesions or ulcers on visualized skin. Sacrum not reexamined today. Neuro: Alert and oriented. No focal neurological deficits. Psych: Judgement and insight appear intact.  Data Reviewed: I have personally reviewed following labs and imaging studies  CBC: Recent Labs  Lab 08/10/21 0408 08/11/21 0301 08/12/21 0052 08/13/21 0337 08/14/21 0110  WBC 6.2 6.6 6.6 6.2 7.3  HGB 12.4* 12.9* 13.2 12.8* 12.9*  HCT 40.0 40.1 41.2 40.8 40.6  MCV 91.5 90.3 90.5 90.5 90.8  PLT 328 301 319 284 034   Basic Metabolic Panel: Recent Labs  Lab 08/09/21 0250 08/10/21 0408 08/11/21 0301 08/12/21 0052 08/13/21 0337 08/15/21 0821  NA 133* 132* 133* 132* 132* 133*  K 3.8 4.3 4.2 4.1 4.2 4.0  CL 100 100 102 99 101 100  CO2 26 24 24 24 24 25   GLUCOSE 216* 229* 181* 224* 263* 268*  BUN 27* 29* 25* 32* 27* 23*  CREATININE 1.21 1.15 1.12 1.35* 1.25* 1.14  CALCIUM 7.9* 7.9* 8.1* 8.3* 8.1* 8.3*  MG 1.4*  --  1.6* 1.7  --  1.5*   GFR: Estimated Creatinine Clearance: 96.8 mL/min (by C-G formula based on SCr of 1.14 mg/dL). Liver Function Tests: Recent Labs  Lab 08/13/21 0337  AST 22  ALT 35  ALKPHOS 95  BILITOT 0.4  PROT 5.6*  ALBUMIN 1.6*   No results for input(s): LIPASE,  AMYLASE in the last 168 hours. No results for input(s): AMMONIA in the last 168 hours. Coagulation Profile: No results for input(s): INR, PROTIME in the last 168 hours. Cardiac Enzymes: No results for input(s): CKTOTAL, CKMB, CKMBINDEX, TROPONINI in the last 168 hours. BNP (last 3 results) No results for input(s): PROBNP in the last 8760 hours. HbA1C: No results for input(s): HGBA1C in the last 72 hours. CBG: Recent Labs  Lab 08/14/21 1151 08/14/21 1619 08/14/21 2231 08/15/21 0739 08/15/21 1139  GLUCAP 151* 125* 197* 225* 150*   Lipid Profile: No results for input(s): CHOL, HDL, LDLCALC, TRIG, CHOLHDL, LDLDIRECT in the last 72 hours. Thyroid Function Tests: No results for input(s): TSH, T4TOTAL, FREET4, T3FREE, THYROIDAB in the last 72 hours. Anemia Panel: No results for input(s): VITAMINB12, FOLATE, FERRITIN, TIBC, IRON, RETICCTPCT in the last 72 hours. Urine analysis:    Component Value Date/Time   COLORURINE YELLOW 07/22/2021 0250   APPEARANCEUR CLOUDY (A) 07/22/2021 0250   LABSPEC 1.018 07/22/2021 [place]  5.0 07/22/2021 0250   GLUCOSEU >=500 (A) 07/22/2021 0250   HGBUR SMALL (A) 07/22/2021 0250   BILIRUBINUR NEGATIVE 07/22/2021 0250   KETONESUR 5 (A) 07/22/2021 0250   PROTEINUR 30 (A) 07/22/2021 0250   UROBILINOGEN 0.2 09/13/2019 0906   NITRITE NEGATIVE 07/22/2021 0250   LEUKOCYTESUR TRACE (A) 07/22/2021 0250   No results found for this or any previous visit (from the past 240 hour(s)).    Radiology Studies: [place] EKG SITE RITE  Result Date: 08/13/2021 If Site Rite image not attached, placement could not be confirmed due to current cardiac rhythm.   Scheduled Meds:  acetaminophen  1,000 mg Oral TID   amiodarone  200 mg Oral Daily   apixaban  5 mg Oral BID   vitamin C  1,000 mg Oral Daily   Chlorhexidine Gluconate Cloth  6 each Topical Daily   collagenase   Topical Daily   docusate sodium  100 mg Oral Daily   gabapentin  300 mg Oral TID   insulin  aspart  0-15 Units Subcutaneous TID WC   insulin aspart  0-5 Units Subcutaneous QHS   insulin aspart  6 Units Subcutaneous TID WC   insulin detemir  30 Units Subcutaneous BID   isosorbide mononitrate  30 mg Oral Daily   lidocaine  2 patch Transdermal Q24H   methocarbamol  1,000 mg Oral Q8H   metoprolol succinate  50 mg Oral Daily   multivitamin with minerals  1 tablet Oral Daily   nutrition supplement (JUVEN)  1 packet Oral BID BM   oxyCODONE  15 mg Oral Q12H   polyethylene glycol  17 g Oral Daily   Ensure Max Protein  11 oz Oral BID BM   sodium chloride flush  10-40 mL Intracatheter Q12H   [START ON 08/16/2021] sodium hypochlorite   Irrigation Daily   torsemide  20 mg Oral QODAY   Continuous Infusions:  sodium chloride Stopped (08/13/21 0457)   ceFEPime (MAXIPIME) IV 2 g (08/15/21 0746)   magnesium sulfate bolus IVPB 4 g (08/15/21 1209)   vancomycin 750 mg (08/15/21 0528)     LOS: 24 days   Time spent: 25 minutes.  Patrecia Pour, MD [facility] [email or web address] 08/15/2021, 1:55 PM

## 2021-08-15 NOTE — Progress Notes (Signed)
Heart Failure Nurse Navigator Progress Note  Enrolled pt in [facility] [employer] services per pt requests. Pt declined HV TOC appt. Pt states he has prosthetics, toilet riser, shower rails, and wheelchair at home, but states his wheelchair doesn't have any rubber on the tires--RNCM notified. Pt states his landlord is putting in a shower bench.   Pt states he is possibly going to CIR upon DC from hospitalization.   Lives home alone with his dog, son lives 20 mins away, has good social support.   Pricilla Holm, MSN, RN Heart Failure Nurse Navigator [phone]

## 2021-08-15 NOTE — Progress Notes (Signed)
Pharmacy Antibiotic Note  [name] is a 55 y.o. male admitted on 07/21/2021 with GBS bacteremia related to L-DFI now s/p trans-tib amputation with progressive discitis/osteo on Vancomycin per patient preference. Now with worsening sacral wound requiring additional coverage. Pharmacy has been consulted to add Cefepime.  Vancomycin trough drawn today at goal 14  Cr stable 1.2  Plan: - Continue Vancomycin 750 mg IV every 8 hours -  - continue  Cefepime 2g IV every 8 hours - Will continue to follow renal function, culture results, LOT, and antibiotic de-escalation plans   Height: 6\' 3"  (190.5 cm) Weight: 107 kg (235 lb 14.3 oz) IBW/kg (Calculated) : 84.5  Temp (24hrs), Avg:98.3 F (36.8 C), Min:97.8 F (36.6 C), Max:98.7 F (37.1 C)  Recent Labs  Lab 08/09/21 1215 08/09/21 2014 08/10/21 0408 08/11/21 0301 08/12/21 0052 08/13/21 0337 08/14/21 0110 08/15/21 0821 08/15/21 1251  WBC  --   --  6.2 6.6 6.6 6.2 7.3  --   --   CREATININE  --   --  1.15 1.12 1.35* 1.25*  --  1.14  --   VANCOTROUGH  --  12*  --   --   --   --   --   --  14*  VANCOPEAK 24*  --   --   --   --   --   --   --   --      Estimated Creatinine Clearance: 96.8 mL/min (by C-G formula based on SCr of 1.14 mg/dL).    Allergies  Allergen Reactions   Bactrim [Sulfamethoxazole-Trimethoprim]    Ceprotin [Protein C Concentrate (Human)]    Ciprofloxacin Other (See Comments)    Kidney function   Levaquin [Levofloxacin]     11/14 Ceftriaxone x1 11/14 Vancomycin >> 11/15, 12/1>> 12/1 Cefepime>>12/5; restart 12/8 11/15 Zosyn >> 11/16 11/15 Clindamycin >>11/15 11/15 Linezolid>> 11/16 11/16 Rocephin>>11/18 11/18 Penicillin G>>12/1 12/2 Acyclovir for 7 days > 12/9  11/17 BCx: ngF 11/16 MRS PCR + 11/14 BCx: GBS pan S 11/15 UCx: GBS   Thank you for allowing pharmacy to be a part of this patient's care.   Bonnita Nasuti Pharm.D. CPP, BCPS Clinical Pharmacist [phone] 08/15/2021 4:10  PM   **Pharmacist phone directory can now be found on [email or web address] ([email or web address]).  Listed under [place].

## 2021-08-15 NOTE — Progress Notes (Signed)
This RN responded to IV team consult for possible CVC/PICC problem. On assessment of RUA PICC Line, the insertion site is noted to have bruising that is visible beyond he edge of the antimicrobial disc. The dressing in clean,dry, and intact. The site is free from redness, swelling, drainage, streaks, s/s inflammation, or s/s infection. The patient does report "a little sore" when insertion site is palpated. The site is soft and compressible when palpated. IVF is infusing via pump. No alarms noted. Patient's RN notified that PICC catheter is noted to have 0 cm exposed and has not pulled out.

## 2021-08-15 NOTE — Progress Notes (Signed)
He states he's doing better, sat up on the edge of the bed today, denies leg pain or NT or weakness other than the stable L HF weakness. Pain improving. He's in good spirits and is thankful for our input. We will f/u on Monday, please call our weekend coverage with any changes, questions, or concerns

## 2021-08-15 NOTE — Progress Notes (Signed)
Offered pt multiple during shift to have CHG bath and pt keeps declining until later time.

## 2021-08-15 NOTE — Progress Notes (Addendum)
Physical Therapy Wound Treatment Patient Details  Name: [name] MRN: [id number] Date of Birth: 03/25/1966  Today's Date: 08/15/2021 Time: 1015-1102 Time Calculation (min): 47 min  Subjective  Subjective Assessment Subjective: I can't control my pee.. Patient and Family Stated Goals: Get well and get out of here... Date of Onset:  (unknown) Prior Treatments:  (unknown)  Pain Score:    Wound Assessment  Pressure Injury 07/27/21 Sacrum Mid Unstageable - Full thickness tissue loss in which the base of the injury is covered by slough (yellow, tan, gray, green or brown) and/or eschar (tan, brown or black) in the wound bed. small pink pressure ulcer with bre (Active)  Wound Image   08/13/21 1440  Dressing Type Barrier Film (skin prep) 08/15/21 1140  Dressing Changed;Clean;Dry;Intact 08/15/21 1140  Dressing Change Frequency Daily 08/15/21 1140  State of Healing Eschar 08/15/21 1140  Site / Wound Assessment Red;Yellow 08/14/21 1754  % Wound base Red or Granulating 30% 08/15/21 1140  % Wound base Yellow/Fibrinous Exudate 50% 08/15/21 1140  % Wound base Black/Eschar 20% 08/15/21 1140  % Wound base Other/Granulation Tissue (Comment) 0% 08/15/21 1140  Peri-wound Assessment Excoriated;Intact 08/14/21 1754  Wound Length (cm) 7 cm 08/13/21 1440  Wound Width (cm) 6 cm 08/13/21 1440  Wound Depth (cm) 0 cm 08/13/21 1440  Wound Surface Area (cm^2) 42 cm^2 08/13/21 1440  Wound Volume (cm^3) 0 cm^3 08/13/21 1440  Margins Unattached edges (unapproximated) 08/15/21 1140  Drainage Amount Moderate 08/15/21 1140  Drainage Description Serous blue-green 08/15/21 1140  Treatment Cleansed;Debridement (Selective);Hydrotherapy (Pulse lavage);Packing (Saline gauze) 08/15/21 1140      Hydrotherapy Pulsed lavage therapy - wound location: sacral Pulsed Lavage with Suction (psi): 8 psi (8=12) Pulsed Lavage with Suction - Normal Saline Used: 1000 mL Pulsed Lavage Tip: Tip with splash shield Selective  Debridement Selective Debridement - Location: sacral Selective Debridement - Tools Used: Forceps, Scalpel Selective Debridement - Tissue Removed: cross-hatched sacral eschar    Wound Assessment and Plan  Wound Therapy - Assess/Plan/Recommendations Wound Therapy - Clinical Statement: pt benefiting from hydrotherapy for pulsed lavage to cleanse and soften wound/eschar for selective debridement and to decrease bio burden.  Urinary incontinence measures improved.  Started fungal powder.  Now with blue green exudate suggestive of pseudomonas.  MD notified. Wound Therapy - Functional Problem List: up to 3 weeks or more relatively immobile in bed. Factors Delaying/Impairing Wound Healing: Diabetes Mellitus, Incontinence, Infection - systemic/local, Immobility, Multiple medical problems Hydrotherapy Plan: Debridement, Dressing change, Patient/family education, Pulsatile lavage with suction Wound Therapy - Frequency: 6X / week Wound Therapy - Current Recommendations: PT Wound Therapy - Follow Up Recommendations: dressing changes by family/patient, dressing changes by RN  Wound Therapy Goals- Improve the function of patient's integumentary system by progressing the wound(s) through the phases of wound healing (inflammation - proliferation - remodeling) by: Wound Therapy Goals - Improve the function of patient's integumentary system by progressing the wound(s) through the phases of wound healing by: Decrease Necrotic Tissue to: 20% Decrease Necrotic Tissue - Progress: Progressing toward goal Increase Granulation Tissue to: 80% Increase Granulation Tissue - Progress: Progressing toward goal Goals/treatment plan/discharge plan were made with and agreed upon by patient/family: Yes Time For Goal Achievement: 7 days Wound Therapy - Potential for Goals: Good  Goals will be updated until maximal potential achieved or discharge criteria met.  Discharge criteria: when goals achieved, discharge from hospital,  MD decision/surgical intervention, no progress towards goals, refusal/missing three consecutive treatments without notification or medical reason.  GP  Charges PT Wound Care Charges $Wound Debridement up to 20 cm: < or equal to 20 cm $ Wound Debridement each add'l 20 sqcm: 1 $PT Hydrotherapy Dressing: 1 dressing $PT PLS Gun and Tip: 1 Supply $PT Hydrotherapy Visit: 1 Visit       [name] Dearion Huot 08/15/2021, 12:46 PM 08/15/2021  [name]., PT Acute Rehabilitation Services [phone]  (pager) [phone]  (office)

## 2021-08-15 NOTE — Progress Notes (Signed)
Pt was transferred to [facility]. Pt is alert and not in distress. All belongings sent with the pt.

## 2021-08-15 NOTE — Progress Notes (Signed)
Occupational Therapy Treatment Patient Details Name: [name] MRN: [id number] DOB: 1965-10-11 Today's Date: 08/15/2021   History of present illness Pt is 55 yo admitted 11/14 with infected left foot s/p Left BKA 11/16. Pt with T6-8 discitis. New onset CHF and aflutter.  CT 12/4 with pleural effusion. PMhx: Rt BKA, Aflutter, DM, depression and anxiety   OT comments  Patient received in bed and agreeable to PT/OT treatment to get to EOB to progress towards OOB transfers. Patient required max assist x2 to get to EOB and tolerated approximately 12 minutes requiring mod-max assist due to posterior leaning. Patient performed reaching exercises to increase core strength and address sitting balance. Patient required +2 assist to return to supine and actively assisted with positioning in bed. Patient encourage to continue HEP. Acute OT to continue to follow.    Recommendations for follow up therapy are one component of a multi-disciplinary discharge planning process, led by the attending physician.  Recommendations may be updated based on patient status, additional functional criteria and insurance authorization.    Follow Up Recommendations       Assistance Recommended at Discharge    Equipment Recommendations       Recommendations for Other Services      Precautions / Restrictions Precautions Precautions: Fall;Back Precaution Comments: back prec for comfort Required Braces or Orthoses: Other Brace Other Brace: LLE limb protector, R BK prosthesis Restrictions Weight Bearing Restrictions: Yes LLE Weight Bearing: Non weight bearing Other Position/Activity Restrictions: RLE prosthetic is not fitting currently per pt       Mobility Bed Mobility Overal bed mobility: Needs Assistance Bed Mobility: Rolling;Sidelying to Sit;Sit to Supine Rolling: Supervision Sidelying to sit: Max assist;+2 for safety/equipment;+2 for physical assistance;HOB elevated (slightly elevated)   Sit to supine:  Mod assist;+2 for safety/equipment;+2 for physical assistance   General bed mobility comments: Pt moving very slowly with bed mobility due to pain, but still pushing through it to sit EOB. Pt requesting to exit R EOB, using bed rail to roll with Supervision. Used overhead trapeze and R bed rail to ascend trunk and scoot hips to EOB, needing modA initially for trunk but maxAx2 to complete trunk ascension with support at posterior aspect of trunk and pt pulling on OT's hand. ModAx2 to manage trunk and hips to return to supine at slow pace to manage pain.    Transfers                   General transfer comment: Unable at this time     Balance Overall balance assessment: Needs assistance Sitting-balance support: Bilateral upper extremity supported;Single extremity supported;Feet supported Sitting balance-Leahy Scale: Poor Sitting balance - Comments: Pt sitting up EOB x ~12 min total this date, initially needing minA posteriorly and at his R lateral trunk for support as he tends to lean posteriorly to manage his back pain. However, as time in sitting progressed he needed up to mod-maxA, displaying increased R lateral trunk flexion. Pt reaching off BOS and across midline with 1 UE at a time to reach targets, 20x each arm, modA for support.       Standing balance comment: Unable                           ADL either performed or assessed with clinical judgement   ADL  Extremity/Trunk Assessment Upper Extremity Assessment LUE Deficits / Details: edema limiting functional use LUE Coordination: decreased fine motor            Vision       Perception     Praxis      Cognition Arousal/Alertness: Awake/alert Behavior During Therapy: WFL for tasks assessed/performed Overall Cognitive Status: Within Functional Limits for tasks assessed                                 General Comments: Pt  does better with gentle encouragement and pushing towards next acheivement towards ultimate goal to get OOB.          Exercises Exercises: Other exercises Other Exercises Other Exercises: Reaching off BOS and across midline minimally while sitting EOB with modA posterior support to reach targets, 20x each UE, to improve core strength and activation   Shoulder Instructions       General Comments Educated pt on importance of making gradual progress towards goal to get OOB to potentially be reconsidered for CIR again as he is adament he will not go to a SNF. Pt verbalized understanding, making goal to sit more upright on EOB next session. Pt verbalized understanding of trunk and leg HEP. Educated pt to progress HOB elevation as his HEP    Pertinent Vitals/ Pain       Pain Assessment: Faces Faces Pain Scale: Hurts whole lot Pain Location: low back pain Pain Descriptors / Indicators: Grimacing;Guarding;Moaning Pain Intervention(s): Monitored during session;Limited activity within patient's tolerance;Repositioned  Home Living                                          Prior Functioning/Environment              Frequency           Progress Toward Goals  OT Goals(current goals can now be found in the care plan section)  Progress towards OT goals: Progressing toward goals  Acute Rehab OT Goals Patient Stated Goal: get in chair OT Goal Formulation: With patient Time For Goal Achievement: 08/20/21 Potential to Achieve Goals: Fair ADL Goals Pt Will Perform Grooming: sitting;with supervision Pt Will Perform Upper Body Bathing: with supervision;sitting Pt Will Perform Lower Body Dressing: with min assist;bed level;sitting/lateral leans Pt Will Transfer to Toilet: with mod assist;bedside commode;anterior/posterior transfer;with transfer board Pt/caregiver will Perform Home Exercise Program: Increased ROM;Left upper extremity;With written HEP provided Additional  ADL Goal #1: Pt will complete bed mobility with mod assist as precursor to ADLs.  Plan      Co-evaluation    PT/OT/SLP Co-Evaluation/Treatment: Yes Reason for Co-Treatment: Complexity of the patient's impairments (multi-system involvement);For patient/therapist safety;To address functional/ADL transfers PT goals addressed during session: Mobility/safety with mobility;Balance OT goals addressed during session: Strengthening/ROM      AM-PAC OT "6 Clicks" Daily Activity     Outcome Measure                    End of Session        Activity Tolerance     Patient Left     Nurse Communication          Time: 9476-5465 OT Time Calculation (min): 26 min  Charges: OT General Charges $OT Visit: 1 Visit OT Treatments $Therapeutic Activity: 8-22 mins  Liliane Channel  Roxy Manns, [place]  Pager [phone] Office [phone]   Trixie Dredge 08/15/2021, 9:48 AM

## 2021-08-15 NOTE — Progress Notes (Signed)
Pt offered during shift to offload and pt declined. Pt states that he can turn himself. Pillows are under patient to allow pressure off of sacral wound.

## 2021-08-15 NOTE — Care Management Important Message (Signed)
Important Message  Patient Details  Name: [name] MRN: [id number] Date of Birth: September 27, 1965   Medicare Important Message Given:  Yes     Shelda Altes 08/15/2021, 10:04 AM

## 2021-08-16 LAB — GLUCOSE, CAPILLARY
Glucose-Capillary: 114 mg/dL — ABNORMAL HIGH (ref 70–99)
Glucose-Capillary: 221 mg/dL — ABNORMAL HIGH (ref 70–99)
Glucose-Capillary: 258 mg/dL — ABNORMAL HIGH (ref 70–99)
Glucose-Capillary: 161 mg/dL — ABNORMAL HIGH (ref 70–99)
Glucose-Capillary: 178 mg/dL — ABNORMAL HIGH (ref 70–99)

## 2021-08-16 MED ORDER — INSULIN DETEMIR 100 UNIT/ML ~~LOC~~ SOLN
30.0000 [IU] | Freq: Two times a day (BID) | SUBCUTANEOUS | Status: DC
  Administered 2021-08-16 – 2021-08-17 (×3): 30 [IU] via SUBCUTANEOUS
  Filled 2021-08-16 (×5): qty 0.3

## 2021-08-16 MED ORDER — NYSTATIN 100000 UNIT/GM EX CREA
TOPICAL_CREAM | Freq: Two times a day (BID) | CUTANEOUS | Status: DC
  Administered 2021-08-16 – 2021-08-24 (×4): 1 via TOPICAL
  Filled 2021-08-16 (×5): qty 15

## 2021-08-16 NOTE — Plan of Care (Signed)
  Problem: Education: Goal: Knowledge of General Education information will improve Description: Including pain rating scale, medication(s)/side effects and non-pharmacologic comfort measures Outcome: Progressing   Problem: Health Behavior/Discharge Planning: Goal: Ability to manage health-related needs will improve Outcome: Progressing   Problem: Clinical Measurements: Goal: Ability to maintain clinical measurements within normal limits will improve Outcome: Progressing Goal: Will remain free from infection Outcome: Progressing Goal: Diagnostic test results will improve Outcome: Progressing Goal: Respiratory complications will improve Outcome: Progressing Goal: Cardiovascular complication will be avoided Outcome: Progressing   Problem: Activity: Goal: Risk for activity intolerance will decrease Outcome: Progressing   Problem: Nutrition: Goal: Adequate nutrition will be maintained Outcome: Progressing   Problem: Coping: Goal: Level of anxiety will decrease Outcome: Progressing   Problem: Elimination: Goal: Will not experience complications related to bowel motility Outcome: Progressing Goal: Will not experience complications related to urinary retention Outcome: Progressing   Problem: Pain Managment: Goal: General experience of comfort will improve Outcome: Progressing   Problem: Safety: Goal: Ability to remain free from injury will improve Outcome: Progressing   Problem: Skin Integrity: Goal: Risk for impaired skin integrity will decrease Outcome: Progressing   Problem: Education: Goal: Knowledge of General Education information will improve Description: Including pain rating scale, medication(s)/side effects and non-pharmacologic comfort measures Outcome: Progressing   Problem: Health Behavior/Discharge Planning: Goal: Ability to manage health-related needs will improve Outcome: Progressing   Problem: Clinical Measurements: Goal: Ability to maintain  clinical measurements within normal limits will improve Outcome: Progressing Goal: Will remain free from infection Outcome: Progressing Goal: Diagnostic test results will improve Outcome: Progressing Goal: Respiratory complications will improve Outcome: Progressing Goal: Cardiovascular complication will be avoided Outcome: Progressing   Problem: Activity: Goal: Risk for activity intolerance will decrease Outcome: Progressing   Problem: Nutrition: Goal: Adequate nutrition will be maintained Outcome: Progressing   Problem: Coping: Goal: Level of anxiety will decrease Outcome: Progressing   Problem: Elimination: Goal: Will not experience complications related to bowel motility Outcome: Progressing Goal: Will not experience complications related to urinary retention Outcome: Progressing   Problem: Pain Managment: Goal: General experience of comfort will improve Outcome: Progressing   Problem: Safety: Goal: Ability to remain free from injury will improve Outcome: Progressing   Problem: Skin Integrity: Goal: Risk for impaired skin integrity will decrease Outcome: Progressing   Problem: Education: Goal: Knowledge of disease or condition will improve Outcome: Progressing Goal: Understanding of medication regimen will improve Outcome: Progressing Goal: Individualized Educational Video(s) Outcome: Progressing   Problem: Activity: Goal: Ability to tolerate increased activity will improve Outcome: Progressing   Problem: Cardiac: Goal: Ability to achieve and maintain adequate cardiopulmonary perfusion will improve Outcome: Progressing   Problem: Health Behavior/Discharge Planning: Goal: Ability to safely manage health-related needs after discharge will improve Outcome: Progressing

## 2021-08-16 NOTE — Progress Notes (Signed)
PROGRESS NOTE  [name]  AGT:[id number] DOB: 1966/03/18 DOA: 07/21/2021 PCP: Patient, No Pcp Per (Inactive)   Brief Narrative: 55 year old male with history of diabetes mellitus type 2, diabetic foot infection with prior right BKA, chronic diastolic CHF, hypertension, dyslipidemia presented with worsening left foot swelling/discoloration and discharge.  On presentation, he was hypotensive, tachycardic with A. fib with RVR with CT suggestive of first MTP osteomyelitis and soft tissue gas concerning for possible necrotizing fasciitis along with AKI with creatinine of 2.7 and mild DKA.  His blood pressures did not improve with IV fluids and he was transferred to ICU and PCCM was consulted.  Orthopedics was consulted.  He underwent left transtibial amputation on 07/23/2021.  He was found to have strep agalactiae bacteremia.  ID was consulted.  He was found to have vertebral discitis versus osteomyelitis on MRI.  He was also found to have new systolic dysfunction with LVEF of 35% along with new onset atrial flutter requiring IV amiodarone which has been switched to oral.  Cardiology was consulted.  He has been started on Eliquis.  S/p TEE followed by DCCV on 11/22.  Developed worsening back pain 11/26-11/27, ID reconsulted, requested MRI T/L-spine, ultimately performed 12/1, with contrast which showed Significant progression of discitis and osteomyelitis at T5 through T8. There is edema extending into the posterior elements. Extensive posterior epidural abscess has progressed since the prior study. Abscess extends from T3 through T9 and is exerting mass-effect on the cord with moderate spinal stenosis T4 through T8.   Due to report of pleural effusions on MRI, thoracentesis was attempted but there was insufficient fluid volume.   Neurosurgery, Dr. Ronnald Ramp, has evaluated the patient recommends no surgery. ID has changed antibiotics to vancomycin with plans for protracted IV abx course thru PICC. He continues  to have PT regularly, getting hydrotherapy for sacral wound which appeared to have infection for which cefepime was added.   Assessment & Plan: Principal Problem:   Discitis of thoracic region Active Problems:   Essential hypertension   Stage 3a chronic kidney disease (HCC)   Type 2 diabetes mellitus with diabetic polyneuropathy, with long-term current use of insulin (HCC)   Right below-knee amputee (HCC)   Diabetic infection of left foot (HCC)   Chronic diastolic (congestive) heart failure (HCC)   Severe sepsis with acute organ dysfunction (HCC)   Chest pain   AKI (acute kidney injury) ([place])   Gas gangrene of foot (HCC)   Atrial flutter (HCC)   Septic shock (HCC)   Systolic dysfunction   Pressure injury of skin   MSSA bacteremia  Septic shock: Evolving on admission from bacteremia/diabetic foot infection -Off pressors.  Shock resolved.   Diabetic left foot infection: Status post left transtibial amputation on 07/23/2021. - Wound/wound VAC DC'ed per Dr. Sharol Given, recommends dry dressing.    Group B streptococcus bacteremia, progressive thoracic discitis/osteomyelitis. No vegetation on TTE or TEE during cardioversion.  MRI repeated with contrast on 12/1 showed Significant progression of discitis and osteomyelitis at T5 through T8 and extensive posterior epidural abscess has progressed since the prior study extending T3 through T9 exerting mass-effect on the cord with moderate spinal stenosis T4 through T8. - Neurosurgery consulted, continues to recommend IV antibiotics and advise against surgery in this difficult situation. Fortunately, there is clinical improvement with this strategy. - Continue IV abx thru PICC (placed 12/7). Total duration TBD. Cleared blood cultures 07/24/2021.   Sacral pressure wound: Infection noted 12/8. D/w ID, culture is unlikely to be helpful here.  Not becoming septic, WBC and temperature remain wnl.  - Started cefepime to include pseudomonas coverage, plan 5  days. - Continue offloading/turning, and getting OOB. - Start topical nystatin  New onset systolic CHF: Echo showed EF of 35% (previous echo showed EF of 50 to 55% in 04/2021). Cardiomyopathy may be from new onset atrial flutter RVR related tachycardia versus stress induced cardiomyopathy from bacteremia and septic shock. - Cardiology no longer following, recommended torsemide 20mg  daily for now. - Continue metop succinate, bidil. AKI limiting ACE/ARB/ARNI and MRA. - HF navigator consulted.  New onset atrial flutter: Maintained NSR since DCCV 11/22 - Continue metoprolol succinate 50mg  daily. - Continue amiodarone 200mg  daily - Restart eliquis since no procedural plans, preferably uninterrupted anticoagulation for 1 month post DCCV.  T2DM uncontrolled with hyperglycemia:  - Continue basal-bolus insulin  AKI on stage IIIa CKD: AKI resolving with limitation on diuresis. - Monitor intermittently while on vancomycin. CrCl now improved.   Pt takes testosterone/anabolic steroids (not prescription):  - Free level checked 11/29, low at 4.0. Rechecked 12/5, normal at 8.8. Albumin is 1.6 making total level falsely low.   Hyponatremia: Improved.   Hypomagnesemia: Improved with supplementation.  - Follow periodically.   Leukocytosis: Resolved.   Thrombocytopenia: Resolved   Generalized deconditioning - PT recommends CIR vs. SNF. Pt making gains and working with PT. We discussed with CIR who will follow the patient for admission once he meets criteria. Unable to DC to SNF while undergoing hydrotherapy.   Acute on chronic pain: Follows with Dr. Nelva Bush, pain management.  Has received steroid injections to the back which, we've confirmed, will be delayed until completion of IV antibiotics. - Reviewed [place] PDMP and patient regularly fills opioid prescriptions. Likely opioid-dependent. - Obviously has reasons for acute pain from recent surgery, discitis/osteomyelitis. - Continue multimodal pain  control as ordered: - Tylenol 1 g 3 times daily. - Oxy IR 10 mg every 4 hourly as needed for moderate pain - OxyContin 15 Mg every 12 hours - Lidocaine patch increased to 2 patches - Dilaudid 0.5 mg IV every 4 hourly as needed severe pain. Need to discuss further with patient but encourage discontinuation of this. - Gabapentin 300 mg 3 times daily   Insomnia - Started trazodone 50 Mg at bedtime as needed on 11/24 with good effect, continue.  Liver nodularity: Noted incidentally on CT chest 04/09/2021. Hypoalbuminemia noted. No ascites or LE edema, or signs of hepatic encephalopathy.  - Check ferritin. May be elevated due to inflammation regardless. - INR was elevated to 1.4 when checked 11/14.   Pulmonary nodules: Unchanged on last CT chest 72mm and smalled definitively benign, no further evaluation recommended.  Reported renal lesion: Per pt. Follows with Dr. Louis Meckel.  - Follow up with Dr. Louis Meckel recommended.   Pleural effusion: Chronic, multifactorial partially related to CHF and to hypoalbuminemia. Not sufficient for thoracentesis.   RN Pressure Injury Documentation: Pressure Injury 07/27/21 Sacrum Mid Unstageable - Full thickness tissue loss in which the base of the injury is covered by slough (yellow, tan, gray, green or brown) and/or eschar (tan, brown or black) in the wound bed. small pink pressure ulcer with bre (Active)  07/27/21 0255  Location: Sacrum  Location Orientation: Mid  Staging: Unstageable - Full thickness tissue loss in which the base of the injury is covered by slough (yellow, tan, gray, green or brown) and/or eschar (tan, brown or black) in the wound bed.  Wound Description (Comments): small pink pressure ulcer with break in skin  08/12/21; updated to unstageable pressure injury  Present on Admission: Yes   DVT prophylaxis: Eliquis Code Status: Full Family Communication: Son last night be phone Disposition Plan:  Status is: Inpatient  Remains inpatient  appropriate because: Unsafe DC. Needs intensive PT, though SNF cannot take him while pursuing hydrotherapy.  Consultants:  ID Neurosurgery PCCM Cardiology  Procedures:  Left TTA 07/23/2021 TEE/DCCV 07/29/2021 PICC RUE 08/13/2021  Antimicrobials: Cefazolin 11/16 Ceftriaxone 11/14, 11/16-17 Clindamycin 11/15-16 Linezolid 11/15 Pen G 11/18 Pip-tazo 11/15-11/16 Vancomycin 11/14 Cefepime 12/8 >>  Subjective: Transferred per request to Agency overnight. Feels nursing care on this floor is good. No new complaints.   Objective: Vitals:   08/15/21 1454 08/15/21 1456 08/16/21 0318 08/16/21 0834  BP:  121/70 121/71 101/60  Pulse: 64  63 70  Resp:   18 16  Temp: 98.7 F (37.1 C)  97.8 F (36.6 C) 98 F (36.7 C)  TempSrc: Oral  Oral Oral  SpO2:   96% 95%  Weight:      Height:        Intake/Output Summary (Last 24 hours) at 08/16/2021 0918 Last data filed at 08/16/2021 2353 Gross per 24 hour  Intake 935.61 ml  Output 3202 ml  Net -2266.39 ml   Filed Weights   07/27/21 0252 07/29/21 0754 08/01/21 0459  Weight: 108.2 kg 108.2 kg 107 kg   Gen: 55 y.o. male in no distress Pulm: Nonlabored breathing room air. Clear. CV: Regular rate and rhythm. No murmur, rub, or gallop. No JVD, no dependent edema. GI: Abdomen soft, non-tender, non-distended, with normoactive bowel sounds.  Ext: Warm, dry. Increased muscle bulk upper body, atrophic legs with amputations, stable.  Skin: No new rashes, lesions or ulcers on visualized skin. Sacral wound has granulation tissue, minimal slough with no odor or exudate. Periwound erythema extends several inches with satellite lesions.  Neuro: Alert and oriented. No focal neurological deficits. Psych: Judgement and insight appear fair. Mood euthymic & affect congruent. Behavior is appropriate.    Data Reviewed: I have personally reviewed following labs and imaging studies  CBC: Recent Labs  Lab 08/10/21 0408 08/11/21 0301 08/12/21 0052  08/13/21 0337 08/14/21 0110  WBC 6.2 6.6 6.6 6.2 7.3  HGB 12.4* 12.9* 13.2 12.8* 12.9*  HCT 40.0 40.1 41.2 40.8 40.6  MCV 91.5 90.3 90.5 90.5 90.8  PLT 328 301 319 284 614   Basic Metabolic Panel: Recent Labs  Lab 08/10/21 0408 08/11/21 0301 08/12/21 0052 08/13/21 0337 08/15/21 0821  NA 132* 133* 132* 132* 133*  K 4.3 4.2 4.1 4.2 4.0  CL 100 102 99 101 100  CO2 24 24 24 24 25   GLUCOSE 229* 181* 224* 263* 268*  BUN 29* 25* 32* 27* 23*  CREATININE 1.15 1.12 1.35* 1.25* 1.14  CALCIUM 7.9* 8.1* 8.3* 8.1* 8.3*  MG  --  1.6* 1.7  --  1.5*   GFR: Estimated Creatinine Clearance: 96.8 mL/min (by C-G formula based on SCr of 1.14 mg/dL). Liver Function Tests: Recent Labs  Lab 08/13/21 0337  AST 22  ALT 35  ALKPHOS 95  BILITOT 0.4  PROT 5.6*  ALBUMIN 1.6*   No results for input(s): LIPASE, AMYLASE in the last 168 hours. No results for input(s): AMMONIA in the last 168 hours. Coagulation Profile: No results for input(s): INR, PROTIME in the last 168 hours. Cardiac Enzymes: No results for input(s): CKTOTAL, CKMB, CKMBINDEX, TROPONINI in the last 168 hours. BNP (last 3 results) No results for input(s): PROBNP in the last 8760  hours. HbA1C: No results for input(s): HGBA1C in the last 72 hours. CBG: Recent Labs  Lab 08/15/21 0739 08/15/21 1139 08/15/21 1558 08/15/21 2145 08/16/21 0609  GLUCAP 225* 150* 244* 113* 114*   Lipid Profile: No results for input(s): CHOL, HDL, LDLCALC, TRIG, CHOLHDL, LDLDIRECT in the last 72 hours. Thyroid Function Tests: No results for input(s): TSH, T4TOTAL, FREET4, T3FREE, THYROIDAB in the last 72 hours. Anemia Panel: No results for input(s): VITAMINB12, FOLATE, FERRITIN, TIBC, IRON, RETICCTPCT in the last 72 hours. Urine analysis:    Component Value Date/Time   COLORURINE YELLOW 07/22/2021 0250   APPEARANCEUR CLOUDY (A) 07/22/2021 0250   LABSPEC 1.018 07/22/2021 0250   PHURINE 5.0 07/22/2021 0250   GLUCOSEU >=500 (A) 07/22/2021  0250   HGBUR SMALL (A) 07/22/2021 0250   BILIRUBINUR NEGATIVE 07/22/2021 0250   KETONESUR 5 (A) 07/22/2021 0250   PROTEINUR 30 (A) 07/22/2021 0250   UROBILINOGEN 0.2 09/13/2019 0906   NITRITE NEGATIVE 07/22/2021 0250   LEUKOCYTESUR TRACE (A) 07/22/2021 0250   No results found for this or any previous visit (from the past 240 hour(s)).    Radiology Studies: No results found.  Scheduled Meds:  acetaminophen  1,000 mg Oral TID   amiodarone  200 mg Oral Daily   apixaban  5 mg Oral BID   vitamin C  1,000 mg Oral Daily   Chlorhexidine Gluconate Cloth  6 each Topical Daily   collagenase   Topical Daily   docusate sodium  100 mg Oral Daily   gabapentin  300 mg Oral TID   insulin aspart  0-15 Units Subcutaneous TID WC   insulin aspart  0-5 Units Subcutaneous QHS   insulin aspart  6 Units Subcutaneous TID WC   insulin detemir  30 Units Subcutaneous BID   isosorbide mononitrate  30 mg Oral Daily   lidocaine  2 patch Transdermal Q24H   methocarbamol  1,000 mg Oral Q8H   metoprolol succinate  50 mg Oral Daily   multivitamin with minerals  1 tablet Oral Daily   nutrition supplement (JUVEN)  1 packet Oral BID BM   oxyCODONE  15 mg Oral Q12H   polyethylene glycol  17 g Oral Daily   Ensure Max Protein  11 oz Oral BID BM   sodium chloride flush  10-40 mL Intracatheter Q12H   sodium hypochlorite   Irrigation Daily   torsemide  20 mg Oral QODAY   Continuous Infusions:  sodium chloride Stopped (08/13/21 0457)   ceFEPime (MAXIPIME) IV 2 g (08/16/21 0602)   vancomycin 750 mg (08/16/21 0641)     LOS: 25 days   Time spent: 25 minutes.  Patrecia Pour, MD [facility] [email or web address] 08/16/2021, 9:18 AM

## 2021-08-16 NOTE — Progress Notes (Signed)
Physical Therapy Wound Treatment Patient Details  Name: [name] MRN: [id number] Date of Birth: 03/16/1966  Today's Date: 08/16/2021 Time: 1000-1035 Time Calculation (min): 35 min  Subjective  Subjective Assessment Subjective: patient talkative about how he got the wound on his sacrum  Pain Score: Pain Score: 4  (pt premedicated for hydro)  Wound Assessment  Pressure Injury 07/27/21 Sacrum Mid Unstageable - Full thickness tissue loss in which the base of the injury is covered by slough (yellow, tan, gray, green or brown) and/or eschar (tan, brown or black) in the wound bed. small pink pressure ulcer with bre (Active)  Wound Image   08/13/21 1440  Dressing Type Barrier Film (skin prep);Normal saline moist dressing;Foam - Lift dressing to assess site every shift;Santyl 08/16/21 1036  Dressing Changed 08/16/21 1036  Dressing Change Frequency Daily 08/16/21 1036  State of Healing Eschar 08/16/21 1036  Site / Wound Assessment Yellow;Red 08/16/21 1036  % Wound base Red or Granulating 30% 08/16/21 1036  % Wound base Yellow/Fibrinous Exudate 60% 08/16/21 1036  % Wound base Black/Eschar 10% 08/16/21 1036  % Wound base Other/Granulation Tissue (Comment) 0% 08/16/21 1036  Peri-wound Assessment Excoriated 08/16/21 1036  Wound Length (cm) 7 cm 08/13/21 1440  Wound Width (cm) 6 cm 08/13/21 1440  Wound Depth (cm) 0 cm 08/13/21 1440  Wound Surface Area (cm^2) 42 cm^2 08/13/21 1440  Wound Volume (cm^3) 0 cm^3 08/13/21 1440  Margins Attached edges (approximated) 08/16/21 1036  Drainage Amount Minimal 08/16/21 1036  Drainage Description Serosanguineous 08/16/21 1036  Treatment Debridement (Selective);Hydrotherapy (Pulse lavage);Packing (Saline gauze);Cleansed 08/16/21 1036      Hydrotherapy Pulsed lavage therapy - wound location: sacral Pulsed Lavage with Suction (psi): 8 psi Pulsed Lavage with Suction - Normal Saline Used: 1000 mL Pulsed Lavage Tip: Tip with splash shield Selective  Debridement Selective Debridement - Location: sacral Selective Debridement - Tools Used: Forceps, Scissors Selective Debridement - Tissue Removed: yellow eschar    Wound Assessment and Plan  Wound Therapy - Assess/Plan/Recommendations Wound Therapy - Clinical Statement: Wound appears cleaner today.  Dakin's not present, so dressed wound with Santyl and requested RN to get Dakins from pharmacy for subsequent dressing changes.  Patient will benefit from continued hydro therapy to cleanse wound, promote healing and decrease bio-burden.  Surrounding wound still with fungal lesions present. Wound Therapy - Functional Problem List: up to 3 weeks or more relatively immobile in bed. Factors Delaying/Impairing Wound Healing: Diabetes Mellitus, Incontinence, Infection - systemic/local, Immobility, Multiple medical problems Hydrotherapy Plan: Debridement, Dressing change, Patient/family education, Pulsatile lavage with suction Wound Therapy - Frequency: 6X / week Wound Therapy - Current Recommendations: PT Wound Therapy - Follow Up Recommendations: dressing changes by family/patient, dressing changes by RN  Wound Therapy Goals- Improve the function of patient's integumentary system by progressing the wound(s) through the phases of wound healing (inflammation - proliferation - remodeling) by: Wound Therapy Goals - Improve the function of patient's integumentary system by progressing the wound(s) through the phases of wound healing by: Decrease Necrotic Tissue to: 20% Decrease Necrotic Tissue - Progress: Progressing toward goal Increase Granulation Tissue to: 80% Increase Granulation Tissue - Progress: Progressing toward goal  Goals will be updated until maximal potential achieved or discharge criteria met.  Discharge criteria: when goals achieved, discharge from hospital, MD decision/surgical intervention, no progress towards goals, refusal/missing three consecutive treatments without notification or  medical reason.  GP     Charges PT Wound Care Charges $Wound Debridement up to 20 cm: < or equal to 20  cm $ Wound Debridement each add'l 20 sqcm: 1 $PT Hydrotherapy Dressing: 1 dressing $PT PLS Gun and Tip: 1 Supply $PT Hydrotherapy Visit: 1 Visit       Shanna Cisco 08/16/2021, 10:48 AM

## 2021-08-17 LAB — GLUCOSE, CAPILLARY
Glucose-Capillary: 250 mg/dL — ABNORMAL HIGH (ref 70–99)
Glucose-Capillary: 195 mg/dL — ABNORMAL HIGH (ref 70–99)
Glucose-Capillary: 96 mg/dL (ref 70–99)
Glucose-Capillary: 233 mg/dL — ABNORMAL HIGH (ref 70–99)

## 2021-08-17 LAB — COMPREHENSIVE METABOLIC PANEL
ALT: 39 U/L (ref 0–44)
AST: 21 U/L (ref 15–41)
Albumin: 1.8 g/dL — ABNORMAL LOW (ref 3.5–5.0)
Alkaline Phosphatase: 102 U/L (ref 38–126)
Anion gap: 7 (ref 5–15)
BUN: 23 mg/dL — ABNORMAL HIGH (ref 6–20)
CO2: 25 mmol/L (ref 22–32)
Calcium: 8.4 mg/dL — ABNORMAL LOW (ref 8.9–10.3)
Chloride: 101 mmol/L (ref 98–111)
Creatinine, Ser: 1.2 mg/dL (ref 0.61–1.24)
GFR, Estimated: >60 mL/min (ref >60)
Glucose, Bld: 268 mg/dL — ABNORMAL HIGH (ref 70–99)
Potassium: 4.3 mmol/L (ref 3.5–5.1)
Sodium: 133 mmol/L — ABNORMAL LOW (ref 135–145)
Total Bilirubin: 0.5 mg/dL (ref 0.3–1.2)
Total Protein: 5.8 g/dL — ABNORMAL LOW (ref 6.5–8.1)

## 2021-08-17 LAB — CBC
HCT: 40.9 % (ref 39.0–52.0)
Hemoglobin: 13.1 g/dL (ref 13.0–17.0)
MCH: 29.3 pg (ref 26.0–34.0)
MCHC: 32 g/dL (ref 30.0–36.0)
MCV: 91.5 fL (ref 80.0–100.0)
Platelets: 304 10*3/uL (ref 150–400)
RBC: 4.47 MIL/uL (ref 4.22–5.81)
RDW: 15.6 % — ABNORMAL HIGH (ref 11.5–15.5)
WBC: 6.6 10*3/uL (ref 4.0–10.5)
nRBC: 0 % (ref 0.0–0.2)

## 2021-08-17 LAB — FERRITIN: Ferritin: 264 ng/mL (ref 24–336)

## 2021-08-17 MED ORDER — TRAZODONE HCL 50 MG PO TABS
100.0000 mg | ORAL_TABLET | Freq: Every evening | ORAL | Status: DC | PRN
  Administered 2021-08-17 – 2021-08-22 (×6): 100 mg via ORAL
  Filled 2021-08-17 (×6): qty 2

## 2021-08-17 NOTE — Progress Notes (Signed)
PROGRESS NOTE  [name]  HUD:[id number] DOB: Jul 12, 1966 DOA: 07/21/2021 PCP: Patient, No Pcp Per (Inactive)   Brief Narrative: 55 year old male with history of diabetes mellitus type 2, diabetic foot infection with prior right BKA, chronic diastolic CHF, hypertension, dyslipidemia presented with worsening left foot swelling/discoloration and discharge.  On presentation, he was hypotensive, tachycardic with A. fib with RVR with CT suggestive of first MTP osteomyelitis and soft tissue gas concerning for possible necrotizing fasciitis along with AKI with creatinine of 2.7 and mild DKA.  His blood pressures did not improve with IV fluids and he was transferred to ICU and PCCM was consulted.  Orthopedics was consulted.  He underwent left transtibial amputation on 07/23/2021.  He was found to have strep agalactiae bacteremia.  ID was consulted.  He was found to have vertebral discitis versus osteomyelitis on MRI.  He was also found to have new systolic dysfunction with LVEF of 35% along with new onset atrial flutter requiring IV amiodarone which has been switched to oral.  Cardiology was consulted.  He has been started on Eliquis.  S/p TEE followed by DCCV on 11/22.  Developed worsening back pain 11/26-11/27, ID reconsulted, requested MRI T/L-spine, ultimately performed 12/1, with contrast which showed Significant progression of discitis and osteomyelitis at T5 through T8. There is edema extending into the posterior elements. Extensive posterior epidural abscess has progressed since the prior study. Abscess extends from T3 through T9 and is exerting mass-effect on the cord with moderate spinal stenosis T4 through T8.   Due to report of pleural effusions on MRI, thoracentesis was attempted but there was insufficient fluid volume.   Neurosurgery, Dr. Ronnald Ramp, has evaluated the patient recommends no surgery. ID has changed antibiotics to vancomycin with plans for protracted IV abx course thru PICC. He continues  to have PT regularly, getting hydrotherapy for sacral wound which appeared to have infection for which cefepime was added.   Assessment & Plan: Principal Problem:   Discitis of thoracic region Active Problems:   Essential hypertension   Stage 3a chronic kidney disease (HCC)   Type 2 diabetes mellitus with diabetic polyneuropathy, with long-term current use of insulin (HCC)   Right below-knee amputee (HCC)   Diabetic infection of left foot (HCC)   Chronic diastolic (congestive) heart failure (HCC)   Severe sepsis with acute organ dysfunction (HCC)   Chest pain   AKI (acute kidney injury) ([place])   Gas gangrene of foot (HCC)   Atrial flutter (HCC)   Septic shock (HCC)   Systolic dysfunction   Pressure injury of skin   MSSA bacteremia  Septic shock: Evolving on admission from bacteremia/diabetic foot infection -Off pressors.  Shock resolved.   Diabetic left foot infection: Status post left transtibial amputation on 07/23/2021. - Wound/wound VAC DC'ed per Dr. Sharol Given, recommends dry dressing.    Group B streptococcus bacteremia, progressive thoracic discitis/osteomyelitis. No vegetation on TTE or TEE during cardioversion.  MRI repeated with contrast on 12/1 showed Significant progression of discitis and osteomyelitis at T5 through T8 and extensive posterior epidural abscess has progressed since the prior study extending T3 through T9 exerting mass-effect on the cord with moderate spinal stenosis T4 through T8. - Neurosurgery consulted, continues to recommend IV antibiotics and advise against surgery in this difficult situation. Fortunately, there is clinical improvement with this strategy. - Continue IV abx thru PICC (placed 12/7). Total duration TBD. Cleared blood cultures 07/24/2021.   Sacral pressure wound: Infection noted 12/8. D/w ID, culture is unlikely to be helpful here.  Not becoming septic, WBC and temperature remain wnl.  - Started cefepime to include pseudomonas coverage, plan 5  days. - Continue offloading/turning, and getting OOB. - Start topical nystatin  New onset systolic CHF: Echo showed EF of 35% (previous echo showed EF of 50 to 55% in 04/2021). Cardiomyopathy may be from new onset atrial flutter RVR related tachycardia versus stress induced cardiomyopathy from bacteremia and septic shock. - Cardiology no longer following, recommended torsemide 20mg  QOD for now. - Continue metop succinate, bidil. AKI limiting ACE/ARB/ARNI and MRA. - HF navigator consulted.  New onset atrial flutter: Maintained NSR since DCCV 11/22 - Continue metoprolol succinate 50mg  daily. - Continue amiodarone 200mg  daily - Restart eliquis since no procedural plans, preferably uninterrupted anticoagulation for 1 month post DCCV.  T2DM uncontrolled with hyperglycemia:  - Continue basal-bolus insulin  AKI on stage IIIa CKD: AKI resolving with limitation on diuresis. - Monitor intermittently while on vancomycin. CrCl now improved.   Pt takes testosterone/anabolic steroids (not prescription):  - Free level checked 11/29, low at 4.0. Rechecked 12/5, normal at 8.8. Albumin is 1.6 making total level falsely low.   Hyponatremia: Improved.   Hypomagnesemia: Improved with supplementation.  - Follow periodically.   Leukocytosis: Resolved.   Thrombocytopenia: Resolved   Generalized deconditioning - PT recommends CIR vs. SNF. Pt making gains and working with PT. We discussed with CIR who will follow the patient for admission once he meets criteria. Unable to DC to SNF while undergoing hydrotherapy.   Acute on chronic pain: Follows with Dr. Nelva Bush, pain management.  Has received steroid injections to the back which, we've confirmed, will be delayed until completion of IV antibiotics. - Reviewed [place] PDMP and patient regularly fills opioid prescriptions. Likely opioid-dependent. - Obviously has reasons for acute pain from recent surgery, discitis/osteomyelitis. Opioid tolerance PTA and may be  increasing here. Bowel regimen also ordered. - Continue multimodal pain control as ordered: - Tylenol 1 g 3 times daily. - Oxy IR 10 mg every 4 hourly as needed for moderate pain - OxyContin 15 Mg every 12 hours - Lidocaine patch increased to 2 patches - Dilaudid 0.5 mg IV every 4 hourly as needed severe pain. Discussed need to limit/discontinue this, though prior to hydrotherapy will remain available. - Gabapentin 300 mg 3 times daily   Insomnia - Started trazodone 50mg  with improvement though incomplete response, increase dose. Well tolerated.   Liver nodularity: Noted incidentally on CT chest 04/09/2021. Hypoalbuminemia noted. No ascites or LE edema, or signs of hepatic encephalopathy.  - Checking ferritin. May be elevated due to inflammation regardless. - INR was elevated to 1.4 when checked 11/14.   Pulmonary nodules: Unchanged on last CT chest 72mm and smalled definitively benign, no further evaluation recommended.  Reported renal lesion: Per pt. Follows with Dr. Louis Meckel.  - Follow up with Dr. Louis Meckel recommended.   Pleural effusion: Chronic, multifactorial partially related to CHF and to hypoalbuminemia. Not sufficient for thoracentesis.   RN Pressure Injury Documentation: Pressure Injury 07/27/21 Sacrum Mid Unstageable - Full thickness tissue loss in which the base of the injury is covered by slough (yellow, tan, gray, green or brown) and/or eschar (tan, brown or black) in the wound bed. small pink pressure ulcer with bre (Active)  07/27/21 0255  Location: Sacrum  Location Orientation: Mid  Staging: Unstageable - Full thickness tissue loss in which the base of the injury is covered by slough (yellow, tan, gray, green or brown) and/or eschar (tan, brown or black) in the wound bed.  Wound Description (Comments): small pink pressure ulcer with break in skin 08/12/21; updated to unstageable pressure injury  Present on Admission: Yes   DVT prophylaxis: Eliquis Code Status:  Full Family Communication: Son last night be phone Disposition Plan:  Status is: Inpatient  Remains inpatient appropriate because: Unsafe DC. Needs intensive PT, though SNF cannot take him while pursuing hydrotherapy.  Consultants:  ID Neurosurgery PCCM Cardiology  Procedures:  Left TTA 07/23/2021 TEE/DCCV 07/29/2021 PICC RUE 08/13/2021  Antimicrobials: Cefazolin 11/16 Ceftriaxone 11/14, 11/16-17 Clindamycin 11/15-16 Linezolid 11/15 Pen G 11/18 Pip-tazo 11/15-11/16 Vancomycin 11/14 Cefepime 12/8 >>  Subjective: couldn't sleep last night. Pain is controlled but he prefers dilaudid to oxycodone demand dosing due to accelerated effect. Has intermittent cough with upper chest pain that doesn't occur otherwise. Pulls >2L on IS without difficulty.  Objective: Vitals:   08/16/21 2300 08/17/21 0052 08/17/21 0359 08/17/21 0515  BP:  139/85 107/69   Pulse: 60 64 (!) 57 62  Resp: 16 17 16 14   Temp:  98.7 F (37.1 C) 97.9 F (36.6 C)   TempSrc:  Oral Oral   SpO2: 96% 97% 99% 97%  Weight:      Height:        Intake/Output Summary (Last 24 hours) at 08/17/2021 0851 Last data filed at 08/16/2021 1000 Gross per 24 hour  Intake 10 ml  Output --  Net 10 ml   Filed Weights   07/27/21 0252 07/29/21 0754 08/01/21 0459  Weight: 108.2 kg 108.2 kg 107 kg   Gen: 55 y.o. male in no distress Pulm: Nonlabored breathing room air. Clear. CV: Regular rate and rhythm. No murmur, rub, or gallop. Equivocal JVD, no pitting dependent edema. GI: Abdomen soft, non-tender, non-distended, with normoactive bowel sounds.  Ext: Warm, BLE amputations, atrophic but strength improving. Skin: No new rashes, lesions or ulcers on visualized skin. Sacrum not examined today. Neuro: Alert and oriented. No focal neurological deficits. Psych: Judgement and insight appear fair. Mood euthymic & affect congruent. Behavior is appropriate.    Data Reviewed: I have personally reviewed following labs and imaging  studies  CBC: Recent Labs  Lab 08/11/21 0301 08/12/21 0052 08/13/21 0337 08/14/21 0110 08/17/21 0700  WBC 6.6 6.6 6.2 7.3 6.6  HGB 12.9* 13.2 12.8* 12.9* 13.1  HCT 40.1 41.2 40.8 40.6 40.9  MCV 90.3 90.5 90.5 90.8 91.5  PLT 301 319 284 321 096   Basic Metabolic Panel: Recent Labs  Lab 08/11/21 0301 08/12/21 0052 08/13/21 0337 08/15/21 0821  NA 133* 132* 132* 133*  K 4.2 4.1 4.2 4.0  CL 102 99 101 100  CO2 24 24 24 25   GLUCOSE 181* 224* 263* 268*  BUN 25* 32* 27* 23*  CREATININE 1.12 1.35* 1.25* 1.14  CALCIUM 8.1* 8.3* 8.1* 8.3*  MG 1.6* 1.7  --  1.5*   GFR: Estimated Creatinine Clearance: 96.8 mL/min (by C-G formula based on SCr of 1.14 mg/dL). Liver Function Tests: Recent Labs  Lab 08/13/21 0337  AST 22  ALT 35  ALKPHOS 95  BILITOT 0.4  PROT 5.6*  ALBUMIN 1.6*   No results for input(s): LIPASE, AMYLASE in the last 168 hours. No results for input(s): AMMONIA in the last 168 hours. Coagulation Profile: No results for input(s): INR, PROTIME in the last 168 hours. Cardiac Enzymes: No results for input(s): CKTOTAL, CKMB, CKMBINDEX, TROPONINI in the last 168 hours. BNP (last 3 results) No results for input(s): PROBNP in the last 8760 hours. HbA1C: No results for input(s): HGBA1C in the  last 72 hours. CBG: Recent Labs  Lab 08/16/21 1154 08/16/21 1346 08/16/21 1542 08/16/21 2128 08/17/21 0648  GLUCAP 221* 258* 161* 178* 250*   Lipid Profile: No results for input(s): CHOL, HDL, LDLCALC, TRIG, CHOLHDL, LDLDIRECT in the last 72 hours. Thyroid Function Tests: No results for input(s): TSH, T4TOTAL, FREET4, T3FREE, THYROIDAB in the last 72 hours. Anemia Panel: No results for input(s): VITAMINB12, FOLATE, FERRITIN, TIBC, IRON, RETICCTPCT in the last 72 hours. Urine analysis:    Component Value Date/Time   COLORURINE YELLOW 07/22/2021 0250   APPEARANCEUR CLOUDY (A) 07/22/2021 0250   LABSPEC 1.018 07/22/2021 0250   PHURINE 5.0 07/22/2021 0250   GLUCOSEU  >=500 (A) 07/22/2021 0250   HGBUR SMALL (A) 07/22/2021 0250   BILIRUBINUR NEGATIVE 07/22/2021 0250   KETONESUR 5 (A) 07/22/2021 0250   PROTEINUR 30 (A) 07/22/2021 0250   UROBILINOGEN 0.2 09/13/2019 0906   NITRITE NEGATIVE 07/22/2021 0250   LEUKOCYTESUR TRACE (A) 07/22/2021 0250   No results found for this or any previous visit (from the past 240 hour(s)).    Radiology Studies: No results found.  Scheduled Meds:  acetaminophen  1,000 mg Oral TID   amiodarone  200 mg Oral Daily   apixaban  5 mg Oral BID   vitamin C  1,000 mg Oral Daily   Chlorhexidine Gluconate Cloth  6 each Topical Daily   collagenase   Topical Daily   docusate sodium  100 mg Oral Daily   gabapentin  300 mg Oral TID   insulin aspart  0-15 Units Subcutaneous TID WC   insulin aspart  0-5 Units Subcutaneous QHS   insulin aspart  6 Units Subcutaneous TID WC   insulin detemir  30 Units Subcutaneous BID   isosorbide mononitrate  30 mg Oral Daily   lidocaine  2 patch Transdermal Q24H   methocarbamol  1,000 mg Oral Q8H   metoprolol succinate  50 mg Oral Daily   multivitamin with minerals  1 tablet Oral Daily   nutrition supplement (JUVEN)  1 packet Oral BID BM   nystatin cream   Topical BID   oxyCODONE  15 mg Oral Q12H   polyethylene glycol  17 g Oral Daily   Ensure Max Protein  11 oz Oral BID BM   sodium chloride flush  10-40 mL Intracatheter Q12H   sodium hypochlorite   Irrigation Daily   torsemide  20 mg Oral QODAY   Continuous Infusions:  sodium chloride Stopped (08/13/21 0457)   ceFEPime (MAXIPIME) IV 2 g (08/17/21 0655)   vancomycin 750 mg (08/17/21 0526)     LOS: 26 days   Time spent: 25 minutes.  Patrecia Pour, MD [facility] [email or web address] 08/17/2021, 8:51 AM

## 2021-08-18 DIAGNOSIS — M4644 Discitis, unspecified, thoracic region: Secondary | ICD-10-CM | POA: Diagnosis not present

## 2021-08-18 DIAGNOSIS — Z89511 Acquired absence of right leg below knee: Secondary | ICD-10-CM | POA: Diagnosis not present

## 2021-08-18 DIAGNOSIS — E11628 Type 2 diabetes mellitus with other skin complications: Secondary | ICD-10-CM | POA: Diagnosis not present

## 2021-08-18 DIAGNOSIS — L89300 Pressure ulcer of unspecified buttock, unstageable: Secondary | ICD-10-CM | POA: Diagnosis not present

## 2021-08-18 LAB — GLUCOSE, CAPILLARY
Glucose-Capillary: 275 mg/dL — ABNORMAL HIGH (ref 70–99)
Glucose-Capillary: 189 mg/dL — ABNORMAL HIGH (ref 70–99)
Glucose-Capillary: 118 mg/dL — ABNORMAL HIGH (ref 70–99)
Glucose-Capillary: 170 mg/dL — ABNORMAL HIGH (ref 70–99)

## 2021-08-18 MED ORDER — INSULIN DETEMIR 100 UNIT/ML ~~LOC~~ SOLN
33.0000 [IU] | Freq: Two times a day (BID) | SUBCUTANEOUS | Status: DC
  Administered 2021-08-18 – 2021-08-20 (×6): 33 [IU] via SUBCUTANEOUS
  Filled 2021-08-18 (×8): qty 0.33

## 2021-08-18 MED ORDER — INSULIN ASPART 100 UNIT/ML IJ SOLN
3.0000 [IU] | Freq: Three times a day (TID) | INTRAMUSCULAR | Status: DC
  Administered 2021-08-18 – 2021-08-21 (×8): 3 [IU] via SUBCUTANEOUS

## 2021-08-18 NOTE — Progress Notes (Signed)
Occupational Therapy Treatment Patient Details Name: [name] MRN: [id number] DOB: March 04, 1966 Today's Date: 08/18/2021   History of present illness Pt is a 55 y.o. 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 S/p TEE, DCCV on 11/22. Chest CT 12/4 with pleural effusion; thoracentesis attempted with insufficient fluid volume. PMH includes R BKA, aflutter, DM, HTN, depression, anxiety.   OT comments  Patient received in supine and eager to work with PT/OT.  Patient required increased time to get to EOB from supine but less than previous visit and continues to require assistance of 2 for safety. Once on EOB patient fluctuated from min to mod assist for sitting balance and was reliant on BUE support. Patient performed reaching exercises to increase trunk strength and increase independence with sitting balance. Patient making gains with OT treatment, progressing towards increase trunk strength to allow for transfers from bed. Acute OT to continue to follow.    Recommendations for follow up therapy are one component of a multi-disciplinary discharge planning process, led by the attending physician.  Recommendations may be updated based on patient status, additional functional criteria and insurance authorization.    Follow Up Recommendations  Skilled nursing-short term rehab (<3 hours/day)    Assistance Recommended at Discharge Frequent or constant Supervision/Assistance  Equipment Recommendations  None recommended by OT    Recommendations for Other Services      Precautions / Restrictions Precautions Precautions: Fall;Back Precaution Comments: back prec for comfort Required Braces or Orthoses: Other Brace Other Brace: LLE limb protector, R BK prosthesis       Mobility  Bed Mobility Overal bed mobility: Needs Assistance Bed Mobility: Sidelying to Sit;Sit to Supine   Sidelying to sit: Max assist;+2 for safety/equipment   Sit to supine: Mod assist;+2 for safety/equipment;+2 for physical assistance   General bed mobility comments: increased time to get to EOB due to slow movements to prevent further back pain    Transfers                   General transfer comment: Unable at this time     Balance Overall balance assessment: Needs assistance Sitting-balance support: Single extremity supported;Bilateral upper extremity supported Sitting balance-Leahy Scale: Poor Sitting balance - Comments: min to mod assist for sitting balance with patient using BUE support. Patient able to perform reaching tasks but reliant on single extremity support.                                   ADL either performed or assessed with clinical judgement   ADL                                              Extremity/Trunk Assessment              Vision       Perception     Praxis      Cognition Arousal/Alertness: Awake/alert Behavior During Therapy: WFL for tasks assessed/performed Overall Cognitive Status: Within Functional Limits for tasks assessed                     Current  Attention Level: Focused           General Comments: Fewer cues for body mechanics and techniques for bed mobility          Exercises     Shoulder Instructions       General Comments      Pertinent Vitals/ Pain       Pain Assessment: Faces Faces Pain Scale: Hurts even more Breathing: normal Negative Vocalization: none Facial Expression: smiling or inexpressive Body Language: relaxed Consolability: no need to console PAINAD Score: 0 Pain Location: low back pain Pain Descriptors / Indicators: Grimacing;Guarding;Moaning Pain Intervention(s): Repositioned;Monitored during session;Limited activity within patient's  tolerance  Home Living                                          Prior Functioning/Environment              Frequency  Min 3X/week        Progress Toward Goals  OT Goals(current goals can now be found in the care plan section)  Progress towards OT goals: Progressing toward goals  Acute Rehab OT Goals Patient Stated Goal: get better and go home OT Goal Formulation: With patient Time For Goal Achievement: 08/20/21 Potential to Achieve Goals: Fair ADL Goals Pt Will Perform Grooming: sitting;with supervision Pt Will Perform Upper Body Bathing: with supervision;sitting Pt Will Perform Lower Body Dressing: with min assist;bed level;sitting/lateral leans Pt Will Transfer to Toilet: with mod assist;bedside commode;anterior/posterior transfer;with transfer board Pt/caregiver will Perform Home Exercise Program: Increased ROM;Left upper extremity;With written HEP provided Additional ADL Goal #1: Pt will complete bed mobility with mod assist as precursor to ADLs.  Plan Discharge plan needs to be updated;Frequency remains appropriate    Co-evaluation    PT/OT/SLP Co-Evaluation/Treatment: Yes Reason for Co-Treatment: Complexity of the patient's impairments (multi-system involvement);For patient/therapist safety   OT goals addressed during session: Other (comment) (bed mobility, sitting balance)      AM-PAC OT "6 Clicks" Daily Activity     Outcome Measure   Help from another person eating meals?: A Little Help from another person taking care of personal grooming?: A Little Help from another person toileting, which includes using toliet, bedpan, or urinal?: Total Help from another person bathing (including washing, rinsing, drying)?: A Lot Help from another person to put on and taking off regular upper body clothing?: A Little Help from another person to put on and taking off regular lower body clothing?: Total 6 Click Score: 13    End of Session    OT  Visit Diagnosis: Muscle weakness (generalized) (M62.81);Pain;Other abnormalities of gait and mobility (R26.89)   Activity Tolerance Patient tolerated treatment well;Patient limited by pain   Patient Left in bed;with call bell/phone within reach   Nurse Communication Mobility status        Time: 5093-2671 OT Time Calculation (min): 28 min  Charges: OT General Charges $OT Visit: 1 Visit OT Treatments $Therapeutic Activity: 8-22 mins  [name], Story  Pager [phone] Office [place] 08/18/2021, 2:38 PM

## 2021-08-18 NOTE — TOC Initial Note (Signed)
Transition of Care [facility]) - Initial/Assessment Note    Patient Details  Name: [name] MRN: [id number] Date of Birth: 03-26-1966  Transition of Care [facility] CM/SW Contact:    Tresa Endo Phone Number: 08/18/2021, 4:24 PM  Clinical Narrative:                 CSW attempted to speak with pt about SNF at DC. Pt stated that he was going to CIR and he did not want to speak with the CSW about SNF until the spoke with the doctor bc no one told him he was not approved for CIR. CSW will follow up with CIR about admission.  Expected Discharge Plan: Skilled Nursing Facility Barriers to Discharge: Continued Medical Work up   Patient Goals and CMS Choice Patient states their goals for this hospitalization and ongoing recovery are:: Rehab in hospital (CIR) CMS Medicare.gov Compare Post Acute Care list provided to:: Patient Choice offered to / list presented to : Patient  Expected Discharge Plan and Services Expected Discharge Plan: [place] In-house Referral: Clinical Social Work Discharge Planning Services: CM Consult, Follow-up appt scheduled Post Acute Care Choice: [place] Living arrangements for the past 2 months: Apartment                                      Prior Living Arrangements/Services Living arrangements for the past 2 months: Apartment Lives with:: Self Patient language and need for interpreter reviewed:: Yes Do you feel safe going back to the place where you live?: Yes      Need for Family Participation in Patient Care: No (Comment) Care giver support system in place?: Yes (comment) Current home services: DME (Patient states he has a wheelchair; however it is old >than 55 years old- aksing for a new one.) Criminal Activity/Legal Involvement Pertinent to Current Situation/Hospitalization: No - Comment as needed  Activities of Daily Living      Permission Sought/Granted Permission sought to share information with :  Family Supports Permission granted to share information with : No              Emotional Assessment Appearance:: Appears stated age Attitude/Demeanor/Rapport: Other (comment) (Pt was upset about not knowing that CIR can no longer accept him, pt will not speak with CSW) Affect (typically observed): Frustrated Orientation: : Oriented to Self, Oriented to Place, Oriented to  Time, Oriented to Situation Alcohol / Substance Use: Not Applicable Psych Involvement: No (comment)  Admission diagnosis:  Septic shock ([place]) [A41.9, R65.21] Sepsis ([place]) [A41.9] Severe sepsis with acute organ dysfunction ([place]) [A41.9, R65.20] Patient Active Problem List   Diagnosis Date Noted   Discitis of thoracic region    MSSA bacteremia    Pressure injury of skin 22/29/7989   Systolic dysfunction    Severe sepsis with acute organ dysfunction ([place]) 07/22/2021   Chest pain 07/22/2021   AKI (acute kidney injury) ([place]) 07/22/2021   Gas gangrene of foot ([place]) 07/22/2021   Atrial flutter ([place]) 07/22/2021   Septic shock ([place]) 07/22/2021   Shortness of breath 05/16/2021   Chronic diastolic (congestive) heart failure ([place]) 05/16/2021   Diabetic infection of left foot ([place]) 04/17/2021   Right below-knee amputee ([place]) 10/30/2019   Dyslipidemia 10/30/2019   Type 2 diabetes mellitus with diabetic polyneuropathy, with long-term current use of insulin ([place]) 10/12/2019   Type 2 diabetes mellitus with stage 3a chronic kidney disease,  without long-term current use of insulin ([place]) 10/12/2019   Essential hypertension 09/25/2019   Type 2 diabetes mellitus with hyperglycemia, with long-term current use of insulin ([place]) 09/25/2019   Stage 3a chronic kidney disease ([place]) 09/25/2019   Hematuria 09/25/2019   Acquired complex renal cyst 09/25/2019   Urinary hesitancy 09/25/2019   Prosthesis adjustments 09/25/2019   Need for immunization against influenza 09/25/2019   Need for Tdap vaccination 09/25/2019   Erectile  dysfunction 09/25/2019   Healthcare maintenance 09/25/2019   Pulmonary nodules 09/25/2019   Depression with anxiety 09/25/2019   PCP:  Patient, No Pcp Per (Inactive) Pharmacy:   [place] ([place]), [place] - [address] [place] ([place]) [place] [address] Phone: [phone] Fax: [phone]     Social Determinants of Health (SDOH) Interventions    Readmission Risk Interventions No flowsheet data found.

## 2021-08-18 NOTE — Progress Notes (Signed)
Physical Therapy Treatment Patient Details Name: [name] MRN: [id number] DOB: 05-Oct-1965 Today's Date: 08/18/2021   History of Present Illness Pt is a 55 y.o. 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 S/p TEE, DCCV on 11/22. Chest CT 12/4 with pleural effusion; thoracentesis attempted with insufficient fluid volume. PMH includes R BKA, aflutter, DM, HTN, depression, anxiety.   PT Comments    Pt progressing with mobility. Today's session focused on EOB activity tolerance and sitting balance. Pt remains limited by back pain/stiffness, generalized weakness, decreased activity tolerance, and impaired balance strategies/postural reactions. Pt motivated to participate and see improvements in sitting tolerance; will plan to discuss transfer training progression next session. Continue to recommend intensive CIR-level therapies to maximize functional mobility and independence prior to return home.    Recommendations for follow up therapy are one component of a multi-disciplinary discharge planning process, led by the attending physician.  Recommendations may be updated based on patient status, additional functional criteria and insurance authorization.  Follow Up Recommendations  Acute inpatient rehab (3hours/day)     Assistance Recommended at Discharge Frequent or constant Supervision/Assistance  Equipment Recommendations  Wheelchair (measurements PT);Wheelchair cushion (measurements PT);Other (comment) (bilateral BKA w/c assessment; may need slide board and drop arm BSC?; further assessment post acute)    Recommendations for Other Services       Precautions / Restrictions Precautions Precautions: Fall;Back Precaution Comments: Back precautions for comfort Required  Braces or Orthoses: Other Brace Other Brace: LLE limb guard; has RLE prosthetic in room (not fitting currently per pt?)     Mobility  Bed Mobility Overal bed mobility: Needs Assistance Bed Mobility: Rolling;Sidelying to Sit;Sit to Supine Rolling: Supervision Sidelying to sit: Max assist;+2 for safety/equipment   Sit to supine: Mod assist;+2 for safety/equipment;+2 for physical assistance   General bed mobility comments: Heavy reliance on BUE support on rail to roll and push into sitting, maxA+1 for UE support to elevate trunk and achieve midline, +2 safety due to posterior lean    Transfers                   General transfer comment: unable - return to supine due to fatigue and pain    Ambulation/Gait                   Stairs             Wheelchair Mobility    Modified Rankin (Stroke Patients Only)       Balance Overall balance assessment: Needs assistance Sitting-balance support: Single extremity supported;Bilateral upper extremity supported Sitting balance-Leahy Scale: Poor Sitting balance - Comments: Heavy reliance on UE support to maintain static sitting; able to maintain sitting with min guard to maxA; pt performing reaching tasks, reliant on UE support, unable to progress to leaning onto L elbow due to c/o back tightness/pain                                    Cognition Arousal/Alertness: Awake/alert Behavior During Therapy: WFL for tasks assessed/performed Overall Cognitive Status: No family/caregiver present to determine baseline cognitive functioning Area of Impairment: Attention;Safety/judgement;Awareness;Problem solving                   Current Attention Level:  Sustained     Safety/Judgement: Decreased awareness of deficits Awareness: Emergent Problem Solving: Requires verbal cues General Comments: Fewer cues for body mechanics and techniques for bed mobility; remains focused on certain aspects of exercises  with difficulty connecting this to bigger picture of functional mobility        Exercises      General Comments        Pertinent Vitals/Pain Pain Assessment: Faces Faces Pain Scale: Hurts even more Breathing: normal Negative Vocalization: none Facial Expression: smiling or inexpressive Body Language: relaxed Consolability: no need to console PAINAD Score: 0 Pain Location: Low back Pain Descriptors / Indicators: Grimacing;Guarding;Moaning Pain Intervention(s): Monitored during session;Limited activity within patient's tolerance    Home Living                          Prior Function            PT Goals (current goals can now be found in the care plan section) Acute Rehab PT Goals Patient Stated Goal: to go to CIR PT Goal Formulation: With patient Time For Goal Achievement: 09/01/21 Potential to Achieve Goals: Fair Progress towards PT goals: Progressing toward goals    Frequency    Min 3X/week      PT Plan Current plan remains appropriate    Co-evaluation PT/OT/SLP Co-Evaluation/Treatment: Yes Reason for Co-Treatment: Complexity of the patient's impairments (multi-system involvement);For patient/therapist safety;To address functional/ADL transfers   OT goals addressed during session: Other (comment) (bed mobility, sitting balance)      AM-PAC PT "6 Clicks" Mobility   Outcome Measure  Help needed turning from your back to your side while in a flat bed without using bedrails?: A Little Help needed moving from lying on your back to sitting on the side of a flat bed without using bedrails?: Total Help needed moving to and from a bed to a chair (including a wheelchair)?: Total Help needed standing up from a chair using your arms (e.g., wheelchair or bedside chair)?: Total Help needed to walk in hospital room?: Total Help needed climbing 3-5 steps with a railing? : Total 6 Click Score: 8    End of Session   Activity Tolerance: Patient tolerated  treatment well Patient left: in bed;with call bell/phone within reach Nurse Communication: Mobility status PT Visit Diagnosis: Other abnormalities of gait and mobility (R26.89);Muscle weakness (generalized) (M62.81);Pain     Time: 9798-9211 PT Time Calculation (min) (ACUTE ONLY): 28 min  Charges:  $Therapeutic Activity: 8-22 mins                      Mabeline Caras, PT, DPT Acute Rehabilitation Services  Pager [phone] Office [place] 08/18/2021, 4:08 PM

## 2021-08-18 NOTE — Progress Notes (Signed)
PROGRESS NOTE  [name]  TGP:[id number] DOB: March 19, 1966 DOA: 07/21/2021 PCP: Patient, No Pcp Per (Inactive)   Brief Narrative: 55 year old male with history of diabetes mellitus type 2, diabetic foot infection with prior right BKA, chronic diastolic CHF, hypertension, dyslipidemia presented with worsening left foot swelling/discoloration and discharge.  On presentation, he was hypotensive, tachycardic with A. fib with RVR with CT suggestive of first MTP osteomyelitis and soft tissue gas concerning for possible necrotizing fasciitis along with AKI with creatinine of 2.7 and mild DKA.  His blood pressures did not improve with IV fluids and he was transferred to ICU and PCCM was consulted.  Orthopedics was consulted.  He underwent left transtibial amputation on 07/23/2021.  He was found to have strep agalactiae bacteremia.  ID was consulted.  He was found to have vertebral discitis versus osteomyelitis on MRI.  He was also found to have new systolic dysfunction with LVEF of 35% along with new onset atrial flutter requiring IV amiodarone which has been switched to oral.  Cardiology was consulted.  He has been started on Eliquis.  S/p TEE followed by DCCV on 11/22.  Developed worsening back pain 11/26-11/27, ID reconsulted, requested MRI T/L-spine, ultimately performed 12/1, with contrast which showed Significant progression of discitis and osteomyelitis at T5 through T8. There is edema extending into the posterior elements. Extensive posterior epidural abscess has progressed since the prior study. Abscess extends from T3 through T9 and is exerting mass-effect on the cord with moderate spinal stenosis T4 through T8.   Due to report of pleural effusions on MRI, thoracentesis was attempted but there was insufficient fluid volume.   Neurosurgery, Dr. Ronnald Ramp, has evaluated the patient recommends no surgery. ID has changed antibiotics to vancomycin with plans for protracted IV abx course thru PICC. He continues  to have PT regularly, getting hydrotherapy for sacral wound which appeared to have infection for which cefepime was added.   Assessment & Plan: Principal Problem:   Discitis of thoracic region Active Problems:   Essential hypertension   Stage 3a chronic kidney disease (HCC)   Type 2 diabetes mellitus with diabetic polyneuropathy, with long-term current use of insulin (HCC)   Right below-knee amputee (HCC)   Diabetic infection of left foot (HCC)   Chronic diastolic (congestive) heart failure (HCC)   Severe sepsis with acute organ dysfunction (HCC)   Chest pain   AKI (acute kidney injury) ([place])   Gas gangrene of foot (HCC)   Atrial flutter (HCC)   Septic shock (HCC)   Systolic dysfunction   Pressure injury of skin   MSSA bacteremia  Septic shock: Evolving on admission from bacteremia/diabetic foot infection -Off pressors.  Shock resolved.   Diabetic left foot infection: Status post left transtibial amputation on 07/23/2021. - Wound/wound VAC DC'ed per Dr. Sharol Given, recommends dry dressing.    Group B streptococcus bacteremia, progressive thoracic discitis/osteomyelitis. No vegetation on TTE or TEE during cardioversion.  MRI repeated with contrast on 12/1 showed Significant progression of discitis and osteomyelitis at T5 through T8 and extensive posterior epidural abscess has progressed since the prior study extending T3 through T9 exerting mass-effect on the cord with moderate spinal stenosis T4 through T8. - Neurosurgery consulted, continues to recommend IV antibiotics and advise against surgery in this difficult situation. Fortunately, there is clinical improvement with this strategy. Will need surveillance imaging soon per NSG, Dr. Ronnald Ramp. - Continue IV abx thru PICC (placed 12/7). Total duration TBD. Cleared blood cultures 07/24/2021.   Sacral pressure wound: Infection noted 12/8.  D/w ID, culture is unlikely to be helpful here. Not becoming septic, WBC and temperature remain wnl.  -  Started cefepime to include pseudomonas coverage, plan 5 days. - Continue offloading/turning, and getting OOB. - Start topical nystatin  New onset systolic CHF: Echo showed EF of 35% (previous echo showed EF of 50 to 55% in 04/2021). Cardiomyopathy may be from new onset atrial flutter RVR related tachycardia versus stress induced cardiomyopathy from bacteremia and septic shock. - Cardiology no longer following, recommended torsemide 20mg  QOD for now. - Continue metop succinate, bidil. CrCl limited ACE/ARB/ARNI and MRA. - HF navigator consulted.  New onset atrial flutter: Maintained NSR since DCCV 11/22 - Continue metoprolol succinate 50mg  daily. - Continue amiodarone 200mg  daily - Restarted eliquis since no procedural plans, preferably uninterrupted anticoagulation for 1 month post DCCV.  T2DM uncontrolled with hyperglycemia:  - Continue basal-bolus insulin. Fasting hyperglycemia with better control postprandially. Increase basal, decrease mealtime novolog.  AKI on stage IIIa CKD: AKI resolving with limitation on diuresis. - Monitor intermittently while on vancomycin. CrCl now improved.   Pt takes testosterone/anabolic steroids (not prescription):  - Free level checked 11/29, low at 4.0. Rechecked 12/5, normal at 8.8. Albumin is 1.6 making total level falsely low.   Hyponatremia: Improved.   Hypomagnesemia: Improved with supplementation.  - Follow periodically.   Leukocytosis: Resolved.   Thrombocytopenia: Resolved  History of incomplete bladder emptying:  - Check postvoid residual - Follow up with urology as outpatient (vs. inpatient if retaining)   Generalized deconditioning - PT recommends CIR vs. SNF. Pt making gains and working with PT. We discussed with CIR who will follow the patient for admission once he meets criteria. Unable to DC to SNF while undergoing hydrotherapy.   Acute on chronic pain: Follows with Dr. Nelva Bush, pain management.  Has received steroid injections to  the back which, we've confirmed, will be delayed until completion of IV antibiotics. - Reviewed [place] PDMP and patient regularly fills opioid prescriptions. Likely opioid-dependent. - Obviously has reasons for acute pain from recent surgery, discitis/osteomyelitis. Opioid tolerance PTA and may be increasing here. Bowel regimen also ordered. - Continue multimodal pain control as ordered: - Tylenol 1 g 3 times daily. - Oxy IR 10 mg every 4 hourly as needed for moderate pain - OxyContin 15 Mg every 12 hours - Lidocaine patch increased to 2 patches - Dilaudid 0.5 mg IV every 4 hourly as needed severe pain. Discussed need to limit/discontinue this, though prior to hydrotherapy will remain available. - Gabapentin 300 mg 3 times daily   Insomnia - Started trazodone 50mg  with incomplete response, increased dose with better response.  Liver nodularity: Noted incidentally on CT chest 04/09/2021. Hypoalbuminemia noted. No ascites or LE edema, or signs of hepatic encephalopathy. Ferritin wnl. - INR was elevated to 1.4 when checked 11/14.   Pulmonary nodules: Unchanged on last CT chest 77mm and smalled definitively benign, no further evaluation recommended.  Reported renal lesion: Per pt. Follows with Dr. Louis Meckel.  - Follow up with Dr. Louis Meckel recommended.   Pleural effusion: Chronic, multifactorial partially related to CHF and to hypoalbuminemia. Not sufficient for thoracentesis.   RN Pressure Injury Documentation: Pressure Injury 07/27/21 Sacrum Mid Unstageable - Full thickness tissue loss in which the base of the injury is covered by slough (yellow, tan, gray, green or brown) and/or eschar (tan, brown or black) in the wound bed. small pink pressure ulcer with bre (Active)  07/27/21 0255  Location: Sacrum  Location Orientation: Mid  Staging: Unstageable -  Full thickness tissue loss in which the base of the injury is covered by slough (yellow, tan, gray, green or brown) and/or eschar (tan, brown or black)  in the wound bed.  Wound Description (Comments): small pink pressure ulcer with break in skin 08/12/21; updated to unstageable pressure injury  Present on Admission: Yes   DVT prophylaxis: Eliquis Code Status: Full Family Communication: None at bedside Disposition Plan:  Status is: Inpatient  Remains inpatient appropriate because: Unsafe DC. Needs intensive PT and hydrotherapy, though SNF cannot take him while pursuing hydrotherapy.  Consultants:  ID Neurosurgery PCCM Cardiology  Procedures:  Left TTA 07/23/2021 TEE/DCCV 07/29/2021 PICC RUE 08/13/2021  Antimicrobials: Cefazolin 11/16 Ceftriaxone 11/14, 11/16-17 Clindamycin 11/15-16 Linezolid 11/15 Pen G 11/18 Pip-tazo 11/15-11/16 Vancomycin 11/14 Cefepime 12/8 - 12/12  Subjective: Slept much better, had overflow of suction canister with urine soaking bed. No other complaints/issues at this time.   Objective: Vitals:   08/17/21 2248 08/18/21 0738 08/18/21 1109 08/18/21 1116  BP: (!) 143/73 119/64 (!) 152/84 (!) 152/84  Pulse: (!) 58   64  Resp: 18 15 15    Temp: 97.9 F (36.6 C) 98.2 F (36.8 C) 97.9 F (36.6 C)   TempSrc: Oral     SpO2: 99%     Weight:      Height:        Intake/Output Summary (Last 24 hours) at 08/18/2021 1325 Last data filed at 08/17/2021 1705 Gross per 24 hour  Intake --  Output 850 ml  Net -850 ml   Filed Weights   07/27/21 0252 07/29/21 0754 08/01/21 0459  Weight: 108.2 kg 108.2 kg 107 kg   Gen: 55 y.o. male in no distress Pulm: Nonlabored breathing room air. Clear. CV: Regular rate and rhythm. No murmur, rub, or gallop. No JVD, no dependent edema. GI: Abdomen soft, non-tender, non-distended, with normoactive bowel sounds.  Ext: Warm, stable amputations LE's with improve strength, muscle bulk low in legs, high in torso/arms. Skin: No new rashes, lesions or ulcers on visualized skin. Sacrum not reexamined today. Neuro: Alert and oriented. No focal neurological deficits. Psych:  Judgement and insight appear fair. Mood euthymic & affect congruent. Behavior is appropriate.    Data Reviewed: I have personally reviewed following labs and imaging studies  CBC: Recent Labs  Lab 08/12/21 0052 08/13/21 0337 08/14/21 0110 08/17/21 0700  WBC 6.6 6.2 7.3 6.6  HGB 13.2 12.8* 12.9* 13.1  HCT 41.2 40.8 40.6 40.9  MCV 90.5 90.5 90.8 91.5  PLT 319 284 321 354   Basic Metabolic Panel: Recent Labs  Lab 08/12/21 0052 08/13/21 0337 08/15/21 0821 08/17/21 0700  NA 132* 132* 133* 133*  K 4.1 4.2 4.0 4.3  CL 99 101 100 101  CO2 24 24 25 25   GLUCOSE 224* 263* 268* 268*  BUN 32* 27* 23* 23*  CREATININE 1.35* 1.25* 1.14 1.20  CALCIUM 8.3* 8.1* 8.3* 8.4*  MG 1.7  --  1.5*  --    GFR: Estimated Creatinine Clearance: 92 mL/min (by C-G formula based on SCr of 1.2 mg/dL). Liver Function Tests: Recent Labs  Lab 08/13/21 0337 08/17/21 0700  AST 22 21  ALT 35 39  ALKPHOS 95 102  BILITOT 0.4 0.5  PROT 5.6* 5.8*  ALBUMIN 1.6* 1.8*   No results for input(s): LIPASE, AMYLASE in the last 168 hours. No results for input(s): AMMONIA in the last 168 hours. Coagulation Profile: No results for input(s): INR, PROTIME in the last 168 hours. Cardiac Enzymes: No results for  input(s): CKTOTAL, CKMB, CKMBINDEX, TROPONINI in the last 168 hours. BNP (last 3 results) No results for input(s): PROBNP in the last 8760 hours. HbA1C: No results for input(s): HGBA1C in the last 72 hours. CBG: Recent Labs  Lab 08/17/21 1109 08/17/21 1549 08/17/21 2128 08/18/21 0636 08/18/21 1111  GLUCAP 195* 96 233* 275* 189*   Lipid Profile: No results for input(s): CHOL, HDL, LDLCALC, TRIG, CHOLHDL, LDLDIRECT in the last 72 hours. Thyroid Function Tests: No results for input(s): TSH, T4TOTAL, FREET4, T3FREE, THYROIDAB in the last 72 hours. Anemia Panel: Recent Labs    08/17/21 0700  FERRITIN 264   Urine analysis:    Component Value Date/Time   COLORURINE YELLOW 07/22/2021 0250    APPEARANCEUR CLOUDY (A) 07/22/2021 0250   LABSPEC 1.018 07/22/2021 0250   PHURINE 5.0 07/22/2021 0250   GLUCOSEU >=500 (A) 07/22/2021 0250   HGBUR SMALL (A) 07/22/2021 0250   BILIRUBINUR NEGATIVE 07/22/2021 0250   KETONESUR 5 (A) 07/22/2021 0250   PROTEINUR 30 (A) 07/22/2021 0250   UROBILINOGEN 0.2 09/13/2019 0906   NITRITE NEGATIVE 07/22/2021 0250   LEUKOCYTESUR TRACE (A) 07/22/2021 0250   No results found for this or any previous visit (from the past 240 hour(s)).    Radiology Studies: No results found.  Scheduled Meds:  acetaminophen  1,000 mg Oral TID   amiodarone  200 mg Oral Daily   apixaban  5 mg Oral BID   vitamin C  1,000 mg Oral Daily   Chlorhexidine Gluconate Cloth  6 each Topical Daily   collagenase   Topical Daily   docusate sodium  100 mg Oral Daily   gabapentin  300 mg Oral TID   insulin aspart  0-15 Units Subcutaneous TID WC   insulin aspart  0-5 Units Subcutaneous QHS   insulin aspart  3 Units Subcutaneous TID WC   insulin detemir  33 Units Subcutaneous BID   isosorbide mononitrate  30 mg Oral Daily   lidocaine  2 patch Transdermal Q24H   methocarbamol  1,000 mg Oral Q8H   metoprolol succinate  50 mg Oral Daily   multivitamin with minerals  1 tablet Oral Daily   nutrition supplement (JUVEN)  1 packet Oral BID BM   nystatin cream   Topical BID   oxyCODONE  15 mg Oral Q12H   polyethylene glycol  17 g Oral Daily   Ensure Max Protein  11 oz Oral BID BM   sodium chloride flush  10-40 mL Intracatheter Q12H   sodium hypochlorite   Irrigation Daily   torsemide  20 mg Oral QODAY   Continuous Infusions:  sodium chloride Stopped (08/13/21 0457)   ceFEPime (MAXIPIME) IV 2 g (08/18/21 0647)   vancomycin 750 mg (08/18/21 0531)     LOS: 27 days   Time spent: 25 minutes.  Patrecia Pour, MD [facility] [email or web address] 08/18/2021, 1:25 PM

## 2021-08-18 NOTE — Progress Notes (Signed)
He continues to look better clinically.  He has less pain.  He is has some chronic left low back pain.  His left hip flexor strength is actually quite a bit better this morning.  It appears full antigravity up to 4-4+.  He continues to move his legs well.  He continues to deny thoracic back pain or thoracic radicular pain or numbness or tingling or weakness.  He remains on cefepime and vancomycin.  He was transferred for better wound care per his report.  He is now back on Eliquis.  He will need follow-up imaging of the thoracic spine in the very near future just for surveillance

## 2021-08-18 NOTE — Progress Notes (Signed)
Physical Therapy Wound Treatment Patient Details  Name: [name] MRN: [id number] Date of Birth: August 04, 1966  Today's Date: 08/18/2021 Time: 1016-1100 Time Calculation (min): 44 min  Subjective  Subjective Assessment Subjective: I can't control my pee.. Patient and Family Stated Goals: Get well and get out of here... Date of Onset:  (unknown) Prior Treatments:  (unknown)  Pain Score:  2/10 with premedication  Wound Assessment  Pressure Injury 07/27/21 Sacrum Mid Unstageable - Full thickness tissue loss in which the base of the injury is covered by slough (yellow, tan, gray, green or brown) and/or eschar (tan, brown or black) in the wound bed. small pink pressure ulcer with bre (Active)  Wound Image   08/13/21 1440  Dressing Type Barrier Film (skin prep);Dakin's-soaked gauze;Gauze (Comment);Moist to dry;Other (Comment) 08/18/21 1303  Dressing Changed;Clean;Dry;Intact 08/18/21 1303  Dressing Change Frequency Daily 08/18/21 1303  State of Healing Eschar 08/18/21 1303  Site / Wound Assessment Clean;Red;Yellow 08/18/21 1303  % Wound base Red or Granulating 30% 08/18/21 1303  % Wound base Yellow/Fibrinous Exudate 60% 08/18/21 1303  % Wound base Black/Eschar 10% 08/18/21 1303  % Wound base Other/Granulation Tissue (Comment) 0% 08/18/21 1303  Peri-wound Assessment Excoriated 08/18/21 1303  Wound Length (cm) 7 cm 08/13/21 1440  Wound Width (cm) 6 cm 08/13/21 1440  Wound Depth (cm) 0 cm 08/13/21 1440  Wound Surface Area (cm^2) 42 cm^2 08/13/21 1440  Wound Volume (cm^3) 0 cm^3 08/13/21 1440  Margins Unattached edges (unapproximated) 08/18/21 1303  Drainage Amount Moderate 08/18/21 1303  Drainage Description Green;Serous 08/18/21 1303  Treatment Cleansed;Debridement (Selective);Hydrotherapy (Pulse lavage);Packing (Saline gauze) 08/18/21 1303      Hydrotherapy Pulsed lavage therapy - wound location: sacral Pulsed Lavage with Suction (psi): 8 psi (8=12) Pulsed Lavage with Suction -  Normal Saline Used: 1000 mL Pulsed Lavage Tip: Tip with splash shield Selective Debridement Selective Debridement - Location: sacral Selective Debridement - Tools Used: Forceps, Scalpel Selective Debridement - Tissue Removed: cross-hatched sacral eschar    Wound Assessment and Plan  Wound Therapy - Assess/Plan/Recommendations Wound Therapy - Clinical Statement: pt benefiting from hydrotherapy for pulsed lavage to cleanse and soften wound/eschar for selective debridement and to decrease bio burden.  Urinary incontinence measures improved.  Continuing fungal powder.  Now with blue green exudate being managed by [name]. Wound Therapy - Functional Problem List: up to 3 weeks or more relatively immobile in bed. Factors Delaying/Impairing Wound Healing: Diabetes Mellitus, Incontinence, Infection - systemic/local, Immobility, Multiple medical problems Hydrotherapy Plan: Debridement, Dressing change, Patient/family education, Pulsatile lavage with suction Wound Therapy - Frequency: 6X / week Wound Therapy - Current Recommendations: PT Wound Therapy - Follow Up Recommendations: dressing changes by family/patient, dressing changes by RN  Wound Therapy Goals- Improve the function of patient's integumentary system by progressing the wound(s) through the phases of wound healing (inflammation - proliferation - remodeling) by: Wound Therapy Goals - Improve the function of patient's integumentary system by progressing the wound(s) through the phases of wound healing by: Decrease Necrotic Tissue to: 20% Decrease Necrotic Tissue - Progress: Progressing toward goal Increase Granulation Tissue to: 80% Increase Granulation Tissue - Progress: Progressing toward goal Goals/treatment plan/discharge plan were made with and agreed upon by patient/family: Yes Time For Goal Achievement: 7 days Wound Therapy - Potential for Goals: Good  Goals will be updated until maximal potential achieved or discharge criteria met.   Discharge criteria: when goals achieved, discharge from hospital, MD decision/surgical intervention, no progress towards goals, refusal/missing three consecutive treatments without notification or medical reason.  GP     Charges PT Wound Care Charges $Wound Debridement up to 20 cm: < or equal to 20 cm $ Wound Debridement each add'l 20 sqcm: 1 $PT Hydrotherapy Dressing: 1 dressing $PT PLS Gun and Tip: 1 Supply $PT Hydrotherapy Visit: 1 Visit       [name] 08/18/2021, 1:11 PM 08/18/2021  [name]., PT Acute Rehabilitation Services [phone]  (pager) [phone]  (office)

## 2021-08-18 NOTE — NC FL2 (Signed)
[place] LEVEL OF CARE SCREENING TOOL     IDENTIFICATION  Patient Name: [name] Birthdate: 1966/08/22 Sex: male Admission Date (Current Location): 07/21/2021  [facility] and [place] Number:  Herbalist and Address:  [facility], [place] [address], [place], [place] [address]      Provider Number: [id number]  Attending Physician Name and Address:  Patrecia Pour, MD  Relative Name and Phone Number:  Prayan, Ulin)   [phone]    Current Level of Care: Hospital Recommended Level of Care: [place] Prior Approval Number:    Date Approved/Denied:   PASRR Number:    Discharge Plan:      Current Diagnoses: Patient Active Problem List   Diagnosis Date Noted   Discitis of thoracic region    MSSA bacteremia    Pressure injury of skin 56/21/3086   Systolic dysfunction    Severe sepsis with acute organ dysfunction ([place]) 07/22/2021   Chest pain 07/22/2021   AKI (acute kidney injury) ([place]) 07/22/2021   Gas gangrene of foot ([place]) 07/22/2021   Atrial flutter ([place]) 07/22/2021   Septic shock ([place]) 07/22/2021   Shortness of breath 05/16/2021   Chronic diastolic (congestive) heart failure ([place]) 05/16/2021   Diabetic infection of left foot ([place]) 04/17/2021   Right below-knee amputee ([place]) 10/30/2019   Dyslipidemia 10/30/2019   Type 2 diabetes mellitus with diabetic polyneuropathy, with long-term current use of insulin ([place]) 10/12/2019   Type 2 diabetes mellitus with stage 3a chronic kidney disease, without long-term current use of insulin ([place]) 10/12/2019   Essential hypertension 09/25/2019   Type 2 diabetes mellitus with hyperglycemia, with long-term current use of insulin ([place]) 09/25/2019   Stage 3a chronic kidney disease ([place]) 09/25/2019   Hematuria 09/25/2019   Acquired complex renal cyst 09/25/2019   Urinary hesitancy 09/25/2019   Prosthesis adjustments 09/25/2019   Need for immunization against influenza  09/25/2019   Need for Tdap vaccination 09/25/2019   Erectile dysfunction 09/25/2019   Healthcare maintenance 09/25/2019   Pulmonary nodules 09/25/2019   Depression with anxiety 09/25/2019    Orientation RESPIRATION BLADDER Height & Weight        Normal Continent Weight: 235 lb 14.3 oz (107 kg) Height:  6\' 3"  (190.5 cm)  BEHAVIORAL SYMPTOMS/MOOD NEUROLOGICAL BOWEL NUTRITION STATUS      Continent Diet (See DC Summary)  AMBULATORY STATUS COMMUNICATION OF NEEDS Skin       Normal                       Personal Care Assistance Level of Assistance              Functional Limitations Info  Sight, Hearing, Speech          SPECIAL CARE FACTORS FREQUENCY  PT (By licensed PT), OT (By licensed OT)     PT Frequency: 5x a week OT Frequency: 5x a week            Contractures Contractures Info: Not present    Additional Factors Info                  Current Medications (08/18/2021):  This is the current hospital active medication list Current Facility-Administered Medications  Medication Dose Route Frequency Provider Last Rate Last Admin   0.9 %  sodium chloride infusion  250 mL Intravenous Continuous [name], MD   Stopped at 08/13/21 0457   acetaminophen (TYLENOL) tablet 1,000 mg  1,000 mg Oral TID Modena Jansky, MD   1,000 mg at 08/18/21 1535   alum & mag hydroxide-simeth (MAALOX/MYLANTA) 200-200-20 MG/5ML suspension 15-30 mL  15-30 mL Oral Q2H PRN [name], MD       amiodarone (PACERONE) tablet 200 mg  200 mg Oral Daily Mooar, [place], PA   200 mg at 08/18/21 1116   apixaban (ELIQUIS) tablet 5 mg  5 mg Oral BID Patrecia Pour, MD   5 mg at 08/18/21 1116   ascorbic acid (VITAMIN C) tablet 1,000 mg  1,000 mg Oral Daily [name], MD   1,000 mg at 08/18/21 1119   bisacodyl (DULCOLAX) EC tablet 5 mg  5 mg Oral Daily PRN [name], MD       ceFEPIme (MAXIPIME) 2 g in sodium chloride 0.9 % 100 mL IVPB  2 g Intravenous Q8H Rolla Flatten, RPH 200 mL/hr at 08/18/21 9983 2 g at 08/18/21 3825   Chlorhexidine Gluconate Cloth 2 % PADS 6 each  6 each Topical Daily Eulogio Bear U, DO   6 each at 08/17/21 0900   collagenase (SANTYL) ointment   Topical Daily Eulogio Bear U, DO   Given at 08/16/21 1000   diphenhydrAMINE (BENADRYL) 12.5 MG/5ML elixir 12.5-25 mg  12.5-25 mg Oral Q4H PRN [name], MD   25 mg at 07/30/21 0010   docusate sodium (COLACE) capsule 100 mg  100 mg Oral Daily [name], MD   100 mg at 08/17/21 1051   gabapentin (NEURONTIN) capsule 300 mg  300 mg Oral TID Jacky Kindle, MD   300 mg at 08/18/21 1535   guaiFENesin-dextromethorphan (ROBITUSSIN DM) 100-10 MG/5ML syrup 15 mL  15 mL Oral Q4H PRN [name], MD       HYDROmorphone (DILAUDID) injection 0.5-1 mg  0.5-1 mg Intravenous Q4H PRN Eulogio Bear U, DO   1 mg at 08/18/21 1442   insulin aspart (novoLOG) injection 0-15 Units  0-15 Units Subcutaneous TID WC Noe Gens L, NP   3 Units at 08/18/21 1209   insulin aspart (novoLOG) injection 0-5 Units  0-5 Units Subcutaneous QHS Noe Gens L, NP   2 Units at 08/17/21 2207   insulin aspart (novoLOG) injection 3 Units  3 Units Subcutaneous TID WC Patrecia Pour, MD   3 Units at 08/18/21 1208   insulin detemir (LEVEMIR) injection 33 Units  33 Units Subcutaneous BID Patrecia Pour, MD   33 Units at 08/18/21 1122   isosorbide mononitrate (IMDUR) 24 hr tablet 30 mg  30 mg Oral Daily Geralynn Rile, MD   30 mg at 08/18/21 1116   lidocaine (LIDODERM) 5 % 2 patch  2 patch Transdermal Q24H Modena Jansky, MD   2 patch at 08/11/21 1603   liver oil-zinc oxide (DESITIN) 40 % ointment   Topical Daily PRN Eulogio Bear U, DO       methocarbamol (ROBAXIN) tablet 1,000 mg  1,000 mg Oral Q8H Vann, Jessica U, DO   1,000 mg at 08/18/21 1535   metoprolol succinate (TOPROL-XL) 24 hr tablet 50 mg  50 mg Oral Daily Janina Mayo, MD   50 mg at 08/18/21 1116   multivitamin with minerals tablet 1 tablet  1  tablet Oral Daily Patrecia Pour, MD   1 tablet at 08/18/21 1117   nutrition supplement (JUVEN) (JUVEN) powder packet 1 packet  1 packet Oral BID BM Patrecia Pour, MD   1 packet  at 08/04/21 1408   nystatin cream (MYCOSTATIN)   Topical BID Patrecia Pour, MD   Given at 08/17/21 2210   ondansetron [facility]) injection 4 mg  4 mg Intravenous Q6H PRN [name], MD       oxyCODONE (Oxy IR/ROXICODONE) immediate release tablet 10 mg  10 mg Oral Q4H PRN Jacky Kindle, MD   10 mg at 08/18/21 0641   oxyCODONE (OXYCONTIN) 12 hr tablet 15 mg  15 mg Oral Q12H Hongalgi, Anand D, MD   15 mg at 08/18/21 1126   phenol (CHLORASEPTIC) mouth spray 1 spray  1 spray Mouth/Throat PRN [name], MD       polyethylene glycol (MIRALAX / GLYCOLAX) packet 17 g  17 g Oral Daily PRN [name], MD       polyethylene glycol (MIRALAX / GLYCOLAX) packet 17 g  17 g Oral Daily Jacky Kindle, MD   17 g at 08/02/21 0909   protein supplement (ENSURE MAX) liquid  11 oz Oral BID BM Vann, Jessica U, DO   11 oz at 08/18/21 0930   sodium chloride flush (NS) 0.9 % injection 10-40 mL  10-40 mL Intracatheter Q12H [name], MD   10 mL at 08/18/21 1000   sodium chloride flush (NS) 0.9 % injection 10-40 mL  10-40 mL Intracatheter PRN [name], MD       sodium hypochlorite (DAKIN'S 1/4 STRENGTH) topical solution   Irrigation Daily Patrecia Pour, MD   Given at 08/17/21 1448   torsemide (DEMADEX) tablet 20 mg  20 mg Oral QODAY Vann, Jessica U, DO   20 mg at 08/17/21 1053   traZODone (DESYREL) tablet 100 mg  100 mg Oral QHS PRN Patrecia Pour, MD   100 mg at 08/17/21 2206   vancomycin (VANCOREADY) IVPB 750 mg/150 mL  750 mg Intravenous Q8H Carney, Gay Filler, RPH 150 mL/hr at 08/18/21 1543 750 mg at 08/18/21 1543     Discharge Medications: Please see discharge summary for a list of discharge medications.  Relevant Imaging Results:  Relevant Lab Results:   Additional Information SSN: [id number]  Reece Agar,  LCSWA

## 2021-08-18 NOTE — Progress Notes (Signed)
Pharmacy Antibiotic Note  [name] is a 55 y.o. male admitted on 07/21/2021 with GBS bacteremia related to L-DFI now s/p trans-tib amputation with progressive discitis/osteo on Vancomycin per patient preference. Now with worsening sacral wound requiring additional coverage. Pharmacy has been consulted to add Cefepime.  Vancomycin trough drawn today at goal 14  Cr stable 1.2  Plan: - Continue Vancomycin 750 mg IV every 8 hours - LOT not yet determined. - continue Cefepime 2g IV every 8 hours (planning 5 days?  Perhaps could stop after today?) - Will continue to follow renal function, culture results, LOT, and antibiotic de-escalation plans   Height: 6\' 3"  (190.5 cm) Weight: 107 kg (235 lb 14.3 oz) IBW/kg (Calculated) : 84.5  Temp (24hrs), Avg:98.1 F (36.7 C), Min:97.9 F (36.6 C), Max:98.4 F (36.9 C)  Recent Labs  Lab 08/12/21 0052 08/13/21 0337 08/14/21 0110 08/15/21 0821 08/15/21 1251 08/17/21 0700  WBC 6.6 6.2 7.3  --   --  6.6  CREATININE 1.35* 1.25*  --  1.14  --  1.20  VANCOTROUGH  --   --   --   --  14*  --      Estimated Creatinine Clearance: 92 mL/min (by C-G formula based on SCr of 1.2 mg/dL).    Allergies  Allergen Reactions   Bactrim [Sulfamethoxazole-Trimethoprim]    Ceprotin [Protein C Concentrate (Human)]    Ciprofloxacin Other (See Comments)    Kidney function   Levaquin [Levofloxacin]    11/14 Ceftriaxone x1 11/14 Vancomycin >> 11/15, 12/1>> total duration TBD... 12/1 Cefepime>>12/5  12/8 > (12/12?) 11/15 Zosyn >> 11/16 11/15 Clindamycin >>11/15 11/15 Linezolid>> 11/16 11/16 Rocephin>>11/18 11/18 Penicillin G>>12/1 by ID (2 weeks IV therapy, then 4-6 weeks amox until 09/14/21) 12/2 acyclovir for 7 days > 12/9  12/3 VP = 24; VT = 12 - AUC = 484 on 750mg  q8 12/9 VT 14>>continue 750 q8  11/17 BCx: ngF 11/16 MRS PCR + 11/14 BCx: GBS pan S 11/15 UCx: GBS   Thank you for allowing pharmacy to be a part of this patient's care.  [place] Crane, Roylene Reason, BCCP Clinical Pharmacist  08/18/2021 8:37 AM   [facility] pharmacy phone numbers are listed on [email or web address]

## 2021-08-18 NOTE — Progress Notes (Signed)
[name] for Infectious Disease  Date of Admission:  07/21/2021           Reason for visit: Follow up on epidural abscess  Current antibiotics: Vancomycin Cefepime    ASSESSMENT:    55 y.o. male admitted with:  Group B strep bacteremia: Complicated by diabetic foot infection/osteomyelitis and status post left BKA 07/23/2021.  This infection has also been complicated by the development of epidural abscess.  He had a repeat MRI on 08/07/2021 that showed progression of the discitis/osteomyelitis at T5-T8 and extensive posterior epidural abscess extending from T3-T9 with mass-effect on the cord and moderate spinal stenosis T4-T8.  There is no plans for surgical intervention at this time and patient feels improved.  He underwent TEE earlier this admission that was negative for vegetation and his blood cultures cleared as of 07/24/2021. Sacral wound: Infection with increased drainage noted on 12/8 and started back on cefepime at that time.  Followed by wound care and receiving hydrotherapy plus topical Nystatin for antifungal.  They are also working on improving incontinence issues to keep area from being soiled. Uncontrolled DM: A1c is 13.8. CKD stage III: Creatinine being monitored intermittently on vancomycin Hx of right BKA   RECOMMENDATIONS:    Continue vancomycin per patient preference for GBS infection.  See prior notes regarding conversation on PCN for treatment Continue cefepime for sacral wound infection.  Plan for 7 days total.  End date 08/21/21. Continue hydrotherapy, Nystatin, and improved incontinence measures for sacral wound Close monitoring of neuro exam Plan for repeat MRI next week (unless clinical change) as this will be about 3 weeks from his prior MRI to assess for progress  Will follow   Principal Problem:   Discitis of thoracic region Active Problems:   Essential hypertension   Stage 3a chronic kidney disease ([place])   Type 2 diabetes mellitus with  diabetic polyneuropathy, with long-term current use of insulin (HCC)   Right below-knee amputee ([place])   Diabetic infection of left foot ([place])   Chronic diastolic (congestive) heart failure (HCC)   Severe sepsis with acute organ dysfunction (HCC)   Chest pain   AKI (acute kidney injury) ([place])   Gas gangrene of foot (HCC)   Atrial flutter (HCC)   Septic shock (HCC)   Systolic dysfunction   Pressure injury of skin   MSSA bacteremia    MEDICATIONS:    Scheduled Meds: . acetaminophen  1,000 mg Oral TID  . amiodarone  200 mg Oral Daily  . apixaban  5 mg Oral BID  . vitamin C  1,000 mg Oral Daily  . Chlorhexidine Gluconate Cloth  6 each Topical Daily  . collagenase   Topical Daily  . docusate sodium  100 mg Oral Daily  . gabapentin  300 mg Oral TID  . insulin aspart  0-15 Units Subcutaneous TID WC  . insulin aspart  0-5 Units Subcutaneous QHS  . insulin aspart  3 Units Subcutaneous TID WC  . insulin detemir  33 Units Subcutaneous BID  . isosorbide mononitrate  30 mg Oral Daily  . lidocaine  2 patch Transdermal Q24H  . methocarbamol  1,000 mg Oral Q8H  . metoprolol succinate  50 mg Oral Daily  . multivitamin with minerals  1 tablet Oral Daily  . nutrition supplement (JUVEN)  1 packet Oral BID BM  . nystatin cream   Topical BID  . oxyCODONE  15 mg Oral Q12H  . polyethylene glycol  17 g Oral Daily  .  Ensure Max Protein  11 oz Oral BID BM  . sodium chloride flush  10-40 mL Intracatheter Q12H  . sodium hypochlorite   Irrigation Daily  . torsemide  20 mg Oral QODAY   Continuous Infusions: . sodium chloride Stopped (08/13/21 0457)  . ceFEPime (MAXIPIME) IV 2 g (08/18/21 0647)  . vancomycin 750 mg (08/18/21 0531)   PRN Meds:.alum & mag hydroxide-simeth, bisacodyl, diphenhydrAMINE, guaiFENesin-dextromethorphan, HYDROmorphone (DILAUDID) injection, liver oil-zinc oxide, ondansetron, oxyCODONE, phenol, polyethylene glycol, sodium chloride flush, traZODone  SUBJECTIVE:   24 hour  events:  No acute events noted. Moved to [facility] per patient request. He seems happier here. Received wound care on Saturday.  Wound appeared cleaner per documentation. Afebrile Labs 12/11 with normal WBC, creatinine 1.2.  Glucose has been elevated. No new micro.  No new imaging.  Pt appeared comfortable lying flat in bed.  WOC at the bedside.   OBJECTIVE:   Blood pressure (!) 152/84, pulse 64, temperature 97.9 F (36.6 C), resp. rate 15, height 6\' 3"  (1.905 m), weight 107 kg, SpO2 99 %. Body mass index is 29.48 kg/m.  Physical Exam Constitutional:      General: He is not in acute distress.    Appearance: Normal appearance.  HENT:     Head: Normocephalic and atraumatic.  Eyes:     Extraocular Movements: Extraocular movements intact.     Conjunctiva/sclera: Conjunctivae normal.  Pulmonary:     Effort: Pulmonary effort is normal. No respiratory distress.  Musculoskeletal:     Comments: B/L BKA  Skin:    General: Skin is warm and dry.  Neurological:     Mental Status: He is alert.     Lab Results: Lab Results  Component Value Date   WBC 6.6 08/17/2021   HGB 13.1 08/17/2021   HCT 40.9 08/17/2021   MCV 91.5 08/17/2021   PLT 304 08/17/2021    Lab Results  Component Value Date   NA 133 (L) 08/17/2021   K 4.3 08/17/2021   CO2 25 08/17/2021   GLUCOSE 268 (H) 08/17/2021   BUN 23 (H) 08/17/2021   CREATININE 1.20 08/17/2021   CALCIUM 8.4 (L) 08/17/2021   GFRNONAA >60 08/17/2021   GFRAA 57 (L) 09/13/2019    Lab Results  Component Value Date   ALT 39 08/17/2021   AST 21 08/17/2021   ALKPHOS 102 08/17/2021   BILITOT 0.5 08/17/2021       Component Value Date/Time   CRP 1.4 (H) 08/13/2021 0337       Component Value Date/Time   ESRSEDRATE 37 (H) 08/13/2021 0712     I have reviewed the micro and lab results in Epic.  Imaging: No results found.   Imaging independently reviewed in Epic.    Raynelle High[facility] for Infectious Disease [facility] [phone] pager 08/18/2021, 1:44 PM

## 2021-08-18 NOTE — Plan of Care (Signed)

## 2021-08-18 NOTE — Consult Note (Signed)
[place] Nurse wound follow up Patient receiving care in [facility] 2C03 Wound type: Unstageable sacral wound Seen by PT for hydrotherapy earlier today. See note for measurements, drainage and wound description. Patient on bedpan when I arrived so I did not look at the wound since the dressing had already been changed. [place] to [employer], PT for update on wound. Will continue to follow weekly and PT will notify [place] team if wound care needs to be updated.   Please re-consult if needed.  Cathlean Marseilles Tamala Julian, MSN, RN, [place], Lysle Pearl, [facility] Wound Treatment Associate Pager [phone]

## 2021-08-19 LAB — GLUCOSE, CAPILLARY
Glucose-Capillary: 174 mg/dL — ABNORMAL HIGH (ref 70–99)
Glucose-Capillary: 218 mg/dL — ABNORMAL HIGH (ref 70–99)
Glucose-Capillary: 193 mg/dL — ABNORMAL HIGH (ref 70–99)
Glucose-Capillary: 226 mg/dL — ABNORMAL HIGH (ref 70–99)

## 2021-08-19 MED ORDER — LIDOCAINE HCL URETHRAL/MUCOSAL 2 % EX GEL
1.0000 | Freq: Once | CUTANEOUS | Status: AC
Start: 2021-08-19 — End: 2021-08-19
  Administered 2021-08-19: 1 via URETHRAL
  Filled 2021-08-19: qty 6

## 2021-08-19 MED ORDER — TAMSULOSIN HCL 0.4 MG PO CAPS
0.4000 mg | ORAL_CAPSULE | Freq: Every day | ORAL | Status: DC
  Administered 2021-08-19 – 2021-09-09 (×22): 0.4 mg via ORAL
  Filled 2021-08-19 (×22): qty 1

## 2021-08-19 MED ORDER — FINASTERIDE 5 MG PO TABS
5.0000 mg | ORAL_TABLET | Freq: Every day | ORAL | Status: DC
  Administered 2021-08-19 – 2021-09-09 (×22): 5 mg via ORAL
  Filled 2021-08-19 (×22): qty 1

## 2021-08-19 NOTE — Progress Notes (Signed)
Coude foley inserted per MD order. Lidocaine utilized prior to insertion. Sterile technique utilized for insertion. 64F coude inserted with yellow urine return noted. Stat lock applied as securing device. No complications noted. Excoriated, red penis noted. Pericare performed. Pt RN at bedside.  Sheela Stack, LPN

## 2021-08-19 NOTE — Progress Notes (Signed)
Physical Therapy Wound Treatment Patient Details  Name: [name] MRN: [id number] Date of Birth: 01-26-1966  Today's Date: 08/19/2021 Time: 4492-0100 Time Calculation (min): 46 min  Subjective  Subjective Assessment Subjective: I can't control my pee.. Patient and Family Stated Goals: Get well and get out of here... Date of Onset:  (unknown) Prior Treatments:  (unknown)  Pain Score:  2-4/10 premedicated   Wound Assessment  Pressure Injury 07/27/21 Sacrum Mid Unstageable - Full thickness tissue loss in which the base of the injury is covered by slough (yellow, tan, gray, green or brown) and/or eschar (tan, brown or black) in the wound bed. small pink pressure ulcer with bre (Active)  Wound Image   08/13/21 1440  Dressing Type Barrier Film (skin prep);Foam - Lift dressing to assess site every shift;Gauze (Comment);Moist to dry;Dakin's-soaked gauze 08/19/21 1310  Dressing Changed;Clean;Dry;Intact 08/19/21 1310  Dressing Change Frequency Daily 08/19/21 1310  State of Healing Eschar 08/19/21 1310  Site / Wound Assessment Brown;Red;Yellow;Pale 08/19/21 1310  % Wound base Red or Granulating 30% 08/19/21 1310  % Wound base Yellow/Fibrinous Exudate 60% 08/19/21 1310  % Wound base Black/Eschar 10% 08/19/21 1310  % Wound base Other/Granulation Tissue (Comment) 0% 08/19/21 1310  Peri-wound Assessment Induration;Other (Comment) 08/19/21 1310  Wound Length (cm) 7 cm 08/13/21 1440  Wound Width (cm) 6 cm 08/13/21 1440  Wound Depth (cm) 0 cm 08/13/21 1440  Wound Surface Area (cm^2) 42 cm^2 08/13/21 1440  Wound Volume (cm^3) 0 cm^3 08/13/21 1440  Margins Unattached edges (unapproximated) 08/19/21 1310  Drainage Amount Minimal 08/19/21 1310  Drainage Description Serous;Serosanguineous 08/19/21 1310  Treatment Cleansed;Debridement (Selective);Hydrotherapy (Pulse lavage);Packing (Saline gauze) 08/19/21 1310      Hydrotherapy Pulsed lavage therapy - wound location: sacral Pulsed Lavage with  Suction (psi): 8 psi (8=12) Pulsed Lavage with Suction - Normal Saline Used: 1000 mL Pulsed Lavage Tip: Tip with splash shield Selective Debridement Selective Debridement - Location: sacral Selective Debridement - Tools Used: Forceps, Scalpel, Scissors Selective Debridement - Tissue Removed: cross-hatched sacral eschar    Wound Assessment and Plan  Wound Therapy - Assess/Plan/Recommendations Wound Therapy - Clinical Statement: pt benefiting from hydrotherapy for pulsed lavage to cleanse and soften wound/eschar for selective debridement and to decrease bio burden.  Urinary incontinence measures improved.  Continuing fungal powder.  Now with blue green exudate being managed by Verdene Lennert. Wound Therapy - Functional Problem List: up to 3 weeks or more relatively immobile in bed. Factors Delaying/Impairing Wound Healing: Diabetes Mellitus, Incontinence, Infection - systemic/local, Immobility, Multiple medical problems Hydrotherapy Plan: Debridement, Dressing change, Patient/family education, Pulsatile lavage with suction Wound Therapy - Frequency: 6X / week Wound Therapy - Current Recommendations: PT Wound Therapy - Follow Up Recommendations: dressing changes by family/patient, dressing changes by RN  Wound Therapy Goals- Improve the function of patient's integumentary system by progressing the wound(s) through the phases of wound healing (inflammation - proliferation - remodeling) by: Wound Therapy Goals - Improve the function of patient's integumentary system by progressing the wound(s) through the phases of wound healing by: Decrease Necrotic Tissue to: 20% Decrease Necrotic Tissue - Progress: Progressing toward goal Increase Granulation Tissue to: 80% Increase Granulation Tissue - Progress: Progressing toward goal Goals/treatment plan/discharge plan were made with and agreed upon by patient/family: Yes Time For Goal Achievement: 7 days Wound Therapy - Potential for Goals: Good  Goals will be  updated until maximal potential achieved or discharge criteria met.  Discharge criteria: when goals achieved, discharge from hospital, MD decision/surgical intervention, no progress towards goals,  refusal/missing three consecutive treatments without notification or medical reason.  GP     Charges PT Wound Care Charges $Wound Debridement up to 20 cm: < or equal to 20 cm $ Wound Debridement each add'l 20 sqcm: 1 $PT Hydrotherapy Dressing: 1 dressing $PT PLS Gun and Tip: 1 Supply $PT Hydrotherapy Visit: 1 Visit       [name] Layia Walla 08/19/2021, 1:13 PM 08/19/2021  [name]., PT Acute Rehabilitation Services [phone]  (pager) [phone]  (office)

## 2021-08-19 NOTE — Progress Notes (Signed)
PROGRESS NOTE  [name]  ZOX:[id number] DOB: 05/17/1966 DOA: 07/21/2021 PCP: Patient, No Pcp Per (Inactive)   Brief Narrative: 55 year old male with history of diabetes mellitus type 2, diabetic foot infection with prior right BKA, chronic diastolic CHF, hypertension, dyslipidemia presented with worsening left foot swelling/discoloration and discharge.  On presentation, he was hypotensive, tachycardic with A. fib with RVR with CT suggestive of first MTP osteomyelitis and soft tissue gas concerning for possible necrotizing fasciitis along with AKI with creatinine of 2.7 and mild DKA.  His blood pressures did not improve with IV fluids and he was transferred to ICU and PCCM was consulted.  Orthopedics was consulted.  He underwent left transtibial amputation on 07/23/2021.  He was found to have strep agalactiae bacteremia.  ID was consulted.  He was found to have vertebral discitis versus osteomyelitis on MRI.  He was also found to have new systolic dysfunction with LVEF of 35% along with new onset atrial flutter requiring IV amiodarone which has been switched to oral.  Cardiology was consulted.  He has been started on Eliquis.  S/p TEE followed by DCCV on 11/22.  Developed worsening back pain 11/26-11/27, ID reconsulted, requested MRI T/L-spine, ultimately performed 12/1, with contrast which showed Significant progression of discitis and osteomyelitis at T5 through T8. There is edema extending into the posterior elements. Extensive posterior epidural abscess has progressed since the prior study. Abscess extends from T3 through T9 and is exerting mass-effect on the cord with moderate spinal stenosis T4 through T8.   Due to report of pleural effusions on MRI, thoracentesis was attempted but there was insufficient fluid volume.   Neurosurgery, Dr. Ronnald Ramp, has evaluated the patient recommends no surgery. ID has changed antibiotics to vancomycin with plans for protracted IV abx course thru PICC. He continues  to have PT regularly, getting hydrotherapy for sacral wound which appeared to have infection for which cefepime was added.   Assessment & Plan: Principal Problem:   Discitis of thoracic region Active Problems:   Essential hypertension   Stage 3a chronic kidney disease (HCC)   Type 2 diabetes mellitus with diabetic polyneuropathy, with long-term current use of insulin (HCC)   Right below-knee amputee (HCC)   Diabetic infection of left foot (HCC)   Chronic diastolic (congestive) heart failure (HCC)   Severe sepsis with acute organ dysfunction (HCC)   Chest pain   AKI (acute kidney injury) ([place])   Gas gangrene of foot (HCC)   Atrial flutter (HCC)   Septic shock (HCC)   Systolic dysfunction   Pressure injury of skin   MSSA bacteremia  Septic shock: Evolving on admission from bacteremia/diabetic foot infection  - Off pressors.  Shock resolved.   Diabetic left foot infection: Status post left transtibial amputation on 07/23/2021. - Wound/wound VAC DC'ed per Dr. Sharol Given, recommends dry dressing which has been applied.    Group B streptococcus bacteremia, progressive thoracic discitis/osteomyelitis. No vegetation on TTE or TEE during cardioversion.  MRI repeated with contrast on 12/1 showed Significant progression of discitis and osteomyelitis at T5 through T8 and extensive posterior epidural abscess has progressed since the prior study extending T3 through T9 exerting mass-effect on the cord with moderate spinal stenosis T4 through T8. - Neurosurgery consulted, continues to recommend IV antibiotics and advise against surgery in this difficult situation. Fortunately, there is clinical improvement with this strategy. Will need surveillance imaging soon per NSG, Dr. Ronnald Ramp. - Continue IV abx thru PICC (placed 12/7). Total duration TBD. Cleared blood cultures 07/24/2021.  Sacral pressure wound: Infection noted 12/8. D/w ID, culture is unlikely to be helpful here. Not becoming septic, WBC and  temperature remain wnl.  - Started cefepime to include pseudomonas coverage, plan 7 days. - Continue offloading/turning, and getting OOB. - Continue topical nystatin  New onset systolic CHF: Echo showed EF of 35% (previous echo showed EF of 50 to 55% in 04/2021). Cardiomyopathy may be from new onset atrial flutter RVR related tachycardia versus stress induced cardiomyopathy from bacteremia and septic shock. - Cardiology no longer following, recommended torsemide 27m QOD for now. - Continue metop succinate, bidil. CrCl limited ACE/ARB/ARNI and MRA. - HF navigator consulted.  New onset atrial flutter: Maintained NSR since DCCV 11/22 - Continue metoprolol succinate 57mdaily. - Continue amiodarone 20039maily - Restarted eliquis since no procedural plans, preferably uninterrupted anticoagulation for 1 month post DCCV.  Acute urinary retention: Pt reports retention-type symptoms chronically and has repeatedly required I&O caths which are met with significant resistance. Strong suspicion for BPH. Pt also reports hx penis fracture not repaired surgically years ago. - Start finasteride, tamsulosin, insert foley catheter. Plan voiding trial prior to discharge, though will need urology follow up regardless.  T2DM uncontrolled with hyperglycemia:  - Continue basal-bolus insulin. Fasting hyperglycemia with better control postprandially. Increase basal, decrease mealtime novolog.  AKI on stage IIIa CKD: AKI resolving with limitation on diuresis. - Monitor intermittently while on vancomycin. CrCl now improved.   Pt takes testosterone/anabolic steroids (not prescription):  - Free level checked 11/29, low at 4.0. Rechecked 12/5, normal at 8.8. Albumin is 1.6 making total level falsely low.   Hyponatremia: Improved.   Hypomagnesemia: Improved with supplementation.  - Follow periodically.   Leukocytosis: Resolved.   Thrombocytopenia: Resolved  History of incomplete bladder emptying:  - Check  postvoid residual - Follow up with urology as outpatient (vs. inpatient if retaining)   Generalized deconditioning - PT recommends CIR vs. SNF. Pt making gains and working with PT. We discussed with CIR who will follow the patient for admission once he meets criteria. Unable to DC to SNF while undergoing hydrotherapy.   Acute on chronic pain: Follows with Dr. RamNelva Bushain management.  Has received steroid injections to the back which, we've confirmed, will be delayed until completion of IV antibiotics. - Reviewed [place] PDMP and patient regularly fills opioid prescriptions. Likely opioid-dependent. - Obviously has reasons for acute pain from recent surgery, discitis/osteomyelitis. Opioid tolerance PTA and may be increasing here. Bowel regimen also ordered. - Continue multimodal pain control as ordered: - Tylenol 1 g 3 times daily. - Oxy IR 10 mg every 4 hourly as needed for moderate pain - OxyContin 15 Mg every 12 hours - Lidocaine patch increased to 2 patches - Dilaudid 0.5 mg IV every 4 hourly as needed severe pain. Discussed need to limit/discontinue this, though prior to hydrotherapy will remain available. - Gabapentin 300 mg 3 times daily   Insomnia - Continue trazodone 100m22mS  Liver nodularity: Noted incidentally on CT chest 04/09/2021. Hypoalbuminemia noted. No ascites or LE edema, or signs of hepatic encephalopathy. Ferritin wnl. - INR was elevated to 1.4 when checked 11/14.   Pulmonary nodules: Unchanged on last CT chest 5mm 101m smalled definitively benign, no further evaluation recommended.  Reported renal lesion: Per pt. Follows with Dr. HerriLouis MeckelFollow up with Dr. HerriLouis Meckelmmended.   Pleural effusion: Chronic, multifactorial partially related to CHF and to hypoalbuminemia. Not sufficient for thoracentesis.   RN Pressure Injury Documentation: Pressure Injury 07/27/21 Sacrum  Mid Unstageable - Full thickness tissue loss in which the base of the injury is covered by slough  (yellow, tan, gray, green or brown) and/or eschar (tan, brown or black) in the wound bed. small pink pressure ulcer with bre (Active)  07/27/21 0255  Location: Sacrum  Location Orientation: Mid  Staging: Unstageable - Full thickness tissue loss in which the base of the injury is covered by slough (yellow, tan, gray, green or brown) and/or eschar (tan, brown or black) in the wound bed.  Wound Description (Comments): small pink pressure ulcer with break in skin 08/12/21; updated to unstageable pressure injury  Present on Admission: Yes   DVT prophylaxis: Eliquis Code Status: Full Family Communication: None at bedside. Son previously updated by phone. Disposition Plan:  Status is: Inpatient  Remains inpatient appropriate because: Unsafe DC. Needs intensive PT and hydrotherapy, though SNF cannot take him while pursuing hydrotherapy.  Consultants:  ID Neurosurgery PCCM Cardiology  Procedures:  Left TTA 07/23/2021 TEE/DCCV 07/29/2021 PICC RUE 08/13/2021  Antimicrobials: Cefazolin 11/16 Ceftriaxone 11/14, 11/16-17 Clindamycin 11/15-16 Linezolid 11/15 Pen G 11/18 Pip-tazo 11/15-11/16 Vancomycin 11/14 Cefepime 12/8 - 12/12  Subjective: Continues to sleep better. Had urinary retention >999cc yesterday relieved with I/O cath, again >999cc's this morning. Long history of urinary frequency. Back pain is stable-to-improving, constant, worse with movements but working more with PT and feeling he's gaining strength.  Objective: Vitals:   08/18/21 1616 08/18/21 2015 08/18/21 2335 08/19/21 0728  BP: 126/83 (!) 150/79 120/72 136/84  Pulse:      Resp: 12   19  Temp: (!) 97.5 F (36.4 C) 97.7 F (36.5 C) 98.1 F (36.7 C) 98.7 F (37.1 C)  TempSrc:  Oral Oral Oral  SpO2:    97%  Weight:      Height:        Intake/Output Summary (Last 24 hours) at 08/19/2021 1019 Last data filed at 08/19/2021 0140 Gross per 24 hour  Intake --  Output 4823 ml  Net -4823 ml   Filed Weights    07/27/21 0252 07/29/21 0754 08/01/21 0459  Weight: 108.2 kg 108.2 kg 107 kg   Gen: 55 y.o. male in no distress Pulm: Nonlabored breathing room air. Clear. CV: Regular rate and rhythm. No murmur, rub, or gallop. No JVD, no pitting dependent edema. GI: Abdomen soft, non-tender, non-distended, with normoactive bowel sounds.  Ext: Warm, dry with bilateral LE amputations, atrophy. Skin: Considerable MASD to groin, perineum, no other new rashes, lesions or ulcers on visualized skin. Neuro: Alert and oriented. No focal neurological deficits. Psych: Judgement and insight appear fair. Mood euthymic & affect congruent. Behavior is appropriate.    Data Reviewed: I have personally reviewed following labs and imaging studies  CBC: Recent Labs  Lab 08/13/21 0337 08/14/21 0110 08/17/21 0700  WBC 6.2 7.3 6.6  HGB 12.8* 12.9* 13.1  HCT 40.8 40.6 40.9  MCV 90.5 90.8 91.5  PLT 284 321 284   Basic Metabolic Panel: Recent Labs  Lab 08/13/21 0337 08/15/21 0821 08/17/21 0700  NA 132* 133* 133*  K 4.2 4.0 4.3  CL 101 100 101  CO2 '24 25 25  ' GLUCOSE 263* 268* 268*  BUN 27* 23* 23*  CREATININE 1.25* 1.14 1.20  CALCIUM 8.1* 8.3* 8.4*  MG  --  1.5*  --    GFR: Estimated Creatinine Clearance: 92 mL/min (by C-G formula based on SCr of 1.2 mg/dL). Liver Function Tests: Recent Labs  Lab 08/13/21 0337 08/17/21 0700  AST 22 21  ALT  35 39  ALKPHOS 95 102  BILITOT 0.4 0.5  PROT 5.6* 5.8*  ALBUMIN 1.6* 1.8*   No results for input(s): LIPASE, AMYLASE in the last 168 hours. No results for input(s): AMMONIA in the last 168 hours. Coagulation Profile: No results for input(s): INR, PROTIME in the last 168 hours. Cardiac Enzymes: No results for input(s): CKTOTAL, CKMB, CKMBINDEX, TROPONINI in the last 168 hours. BNP (last 3 results) No results for input(s): PROBNP in the last 8760 hours. HbA1C: No results for input(s): HGBA1C in the last 72 hours. CBG: Recent Labs  Lab 08/18/21 0636  08/18/21 1111 08/18/21 1621 08/18/21 2123 08/19/21 0634  GLUCAP 275* 189* 118* 170* 174*   Lipid Profile: No results for input(s): CHOL, HDL, LDLCALC, TRIG, CHOLHDL, LDLDIRECT in the last 72 hours. Thyroid Function Tests: No results for input(s): TSH, T4TOTAL, FREET4, T3FREE, THYROIDAB in the last 72 hours. Anemia Panel: Recent Labs    08/17/21 0700  FERRITIN 264   Urine analysis:    Component Value Date/Time   COLORURINE YELLOW 07/22/2021 0250   APPEARANCEUR CLOUDY (A) 07/22/2021 0250   LABSPEC 1.018 07/22/2021 0250   PHURINE 5.0 07/22/2021 0250   GLUCOSEU >=500 (A) 07/22/2021 0250   HGBUR SMALL (A) 07/22/2021 0250   BILIRUBINUR NEGATIVE 07/22/2021 0250   KETONESUR 5 (A) 07/22/2021 0250   PROTEINUR 30 (A) 07/22/2021 0250   UROBILINOGEN 0.2 09/13/2019 0906   NITRITE NEGATIVE 07/22/2021 0250   LEUKOCYTESUR TRACE (A) 07/22/2021 0250   No results found for this or any previous visit (from the past 240 hour(s)).    Radiology Studies: No results found.  Scheduled Meds:  acetaminophen  1,000 mg Oral TID   amiodarone  200 mg Oral Daily   apixaban  5 mg Oral BID   vitamin C  1,000 mg Oral Daily   Chlorhexidine Gluconate Cloth  6 each Topical Daily   collagenase   Topical Daily   docusate sodium  100 mg Oral Daily   gabapentin  300 mg Oral TID   insulin aspart  0-15 Units Subcutaneous TID WC   insulin aspart  0-5 Units Subcutaneous QHS   insulin aspart  3 Units Subcutaneous TID WC   insulin detemir  33 Units Subcutaneous BID   isosorbide mononitrate  30 mg Oral Daily   lidocaine  2 patch Transdermal Q24H   methocarbamol  1,000 mg Oral Q8H   metoprolol succinate  50 mg Oral Daily   multivitamin with minerals  1 tablet Oral Daily   nutrition supplement (JUVEN)  1 packet Oral BID BM   nystatin cream   Topical BID   oxyCODONE  15 mg Oral Q12H   polyethylene glycol  17 g Oral Daily   Ensure Max Protein  11 oz Oral BID BM   sodium chloride flush  10-40 mL Intracatheter  Q12H   torsemide  20 mg Oral QODAY   Continuous Infusions:  sodium chloride Stopped (08/13/21 0457)   ceFEPime (MAXIPIME) IV 2 g (08/19/21 0733)   vancomycin 750 mg (08/19/21 0534)     LOS: 28 days   Time spent: 25 minutes.  Patrecia Pour, MD [facility] [email or web address] 08/19/2021, 10:19 AM

## 2021-08-19 NOTE — Progress Notes (Signed)
Post residual bladder scan Pt and hand > 954m, on call MD notified and advised to I & O Pt.  When I & O Pt., met resistance at prostate area, and painful for patient when passing catheter.

## 2021-08-19 NOTE — Plan of Care (Signed)
  Problem: Education: Goal: Knowledge of General Education information will improve Description: Including pain rating scale, medication(s)/side effects and non-pharmacologic comfort measures Outcome: Progressing   Problem: Health Behavior/Discharge Planning: Goal: Ability to manage health-related needs will improve Outcome: Progressing   Problem: Clinical Measurements: Goal: Ability to maintain clinical measurements within normal limits will improve Outcome: Progressing Goal: Will remain free from infection Outcome: Progressing Goal: Diagnostic test results will improve Outcome: Progressing Goal: Respiratory complications will improve Outcome: Progressing Goal: Cardiovascular complication will be avoided Outcome: Progressing   Problem: Nutrition: Goal: Adequate nutrition will be maintained Outcome: Progressing   Problem: Coping: Goal: Level of anxiety will decrease Outcome: Progressing   Problem: Elimination: Goal: Will not experience complications related to bowel motility Outcome: Progressing Goal: Will not experience complications related to urinary retention Outcome: Progressing   Problem: Pain Managment: Goal: General experience of comfort will improve Outcome: Progressing   Problem: Safety: Goal: Ability to remain free from injury will improve Outcome: Progressing   Problem: Education: Goal: Knowledge of General Education information will improve Description: Including pain rating scale, medication(s)/side effects and non-pharmacologic comfort measures Outcome: Progressing   Problem: Clinical Measurements: Goal: Ability to maintain clinical measurements within normal limits will improve Outcome: Progressing Goal: Will remain free from infection Outcome: Progressing Goal: Diagnostic test results will improve Outcome: Progressing Goal: Respiratory complications will improve Outcome: Progressing Goal: Cardiovascular complication will be avoided Outcome:  Progressing   Problem: Activity: Goal: Risk for activity intolerance will decrease Outcome: Progressing   Problem: Nutrition: Goal: Adequate nutrition will be maintained Outcome: Progressing   Problem: Coping: Goal: Level of anxiety will decrease Outcome: Progressing   Problem: Elimination: Goal: Will not experience complications related to bowel motility Outcome: Progressing Goal: Will not experience complications related to urinary retention Outcome: Progressing   Problem: Pain Managment: Goal: General experience of comfort will improve Outcome: Progressing   Problem: Safety: Goal: Ability to remain free from injury will improve Outcome: Progressing   Problem: Education: Goal: Knowledge of disease or condition will improve Outcome: Progressing Goal: Understanding of medication regimen will improve Outcome: Progressing Goal: Individualized Educational Video(s) Outcome: Progressing   Problem: Cardiac: Goal: Ability to achieve and maintain adequate cardiopulmonary perfusion will improve Outcome: Progressing   Problem: Health Behavior/Discharge Planning: Goal: Ability to safely manage health-related needs after discharge will improve Outcome: Progressing

## 2021-08-19 NOTE — Plan of Care (Signed)
  Problem: Activity: Goal: Ability to tolerate increased activity will improve 08/19/2021 0237 by Shanon Ace, RN Outcome: Not Progressing 08/19/2021 0236 by Shanon Ace, RN Outcome: Progressing   Problem: Skin Integrity: Goal: Risk for impaired skin integrity will decrease 08/19/2021 0237 by Shanon Ace, RN Outcome: Not Progressing 08/19/2021 0236 by Shanon Ace, RN Outcome: Progressing   Problem: Activity: Goal: Risk for activity intolerance will decrease 08/19/2021 0237 by Shanon Ace, RN Outcome: Not Progressing 08/19/2021 0236 by Shanon Ace, RN Outcome: Progressing   Problem: Skin Integrity: Goal: Risk for impaired skin integrity will decrease 08/19/2021 0237 by Shanon Ace, RN Outcome: Not Progressing 08/19/2021 0236 by Shanon Ace, RN Outcome: Progressing   Problem: Activity: Goal: Risk for activity intolerance will decrease 08/19/2021 0237 by Shanon Ace, RN Outcome: Not Progressing 08/19/2021 0236 by Shanon Ace, RN Outcome: Progressing

## 2021-08-20 LAB — COMPREHENSIVE METABOLIC PANEL
ALT: 33 U/L (ref 0–44)
AST: 20 U/L (ref 15–41)
Albumin: 1.9 g/dL — ABNORMAL LOW (ref 3.5–5.0)
Alkaline Phosphatase: 108 U/L (ref 38–126)
Anion gap: 6 (ref 5–15)
BUN: 27 mg/dL — ABNORMAL HIGH (ref 6–20)
CO2: 26 mmol/L (ref 22–32)
Calcium: 8.2 mg/dL — ABNORMAL LOW (ref 8.9–10.3)
Chloride: 99 mmol/L (ref 98–111)
Creatinine, Ser: 1.33 mg/dL — ABNORMAL HIGH (ref 0.61–1.24)
GFR, Estimated: >60 mL/min (ref >60)
Glucose, Bld: 206 mg/dL — ABNORMAL HIGH (ref 70–99)
Potassium: 3.8 mmol/L (ref 3.5–5.1)
Sodium: 131 mmol/L — ABNORMAL LOW (ref 135–145)
Total Bilirubin: 0.4 mg/dL (ref 0.3–1.2)
Total Protein: 5.8 g/dL — ABNORMAL LOW (ref 6.5–8.1)

## 2021-08-20 LAB — CBC
HCT: 39.8 % (ref 39.0–52.0)
Hemoglobin: 12.8 g/dL — ABNORMAL LOW (ref 13.0–17.0)
MCH: 29.2 pg (ref 26.0–34.0)
MCHC: 32.2 g/dL (ref 30.0–36.0)
MCV: 90.7 fL (ref 80.0–100.0)
Platelets: 288 10*3/uL (ref 150–400)
RBC: 4.39 MIL/uL (ref 4.22–5.81)
RDW: 15.5 % (ref 11.5–15.5)
WBC: 7.3 10*3/uL (ref 4.0–10.5)
nRBC: 0 % (ref 0.0–0.2)

## 2021-08-20 LAB — GLUCOSE, CAPILLARY
Glucose-Capillary: 153 mg/dL — ABNORMAL HIGH (ref 70–99)
Glucose-Capillary: 242 mg/dL — ABNORMAL HIGH (ref 70–99)
Glucose-Capillary: 265 mg/dL — ABNORMAL HIGH (ref 70–99)
Glucose-Capillary: 210 mg/dL — ABNORMAL HIGH (ref 70–99)

## 2021-08-20 LAB — SEDIMENTATION RATE: Sed Rate: 34 mm/hr — ABNORMAL HIGH (ref 0–16)

## 2021-08-20 LAB — C-REACTIVE PROTEIN: CRP: 1.3 mg/dL — ABNORMAL HIGH (ref <1.0)

## 2021-08-20 NOTE — Progress Notes (Signed)
Physical Therapy Wound Treatment Patient Details  Name: [name] MRN: [id number] Date of Birth: 1966-06-24  Today's Date: 08/20/2021 Time: 1020-1104 Time Calculation (min): 44 min  Subjective  Subjective Assessment Subjective: I've got to get another bed... my back is killing me.Marland KitchenMarland KitchenI hope the sepsis is not back... Patient and Family Stated Goals: Get well and get out of here... Date of Onset:  (unknown) Prior Treatments:  (unknown)  Pain Score:  6/10  premedicated  Wound Assessment  Pressure Injury 07/27/21 Sacrum Mid Unstageable - Full thickness tissue loss in which the base of the injury is covered by slough (yellow, tan, gray, green or brown) and/or eschar (tan, brown or black) in the wound bed. small pink pressure ulcer with bre (Active)  Wound Image   08/13/21 1440  Dressing Type Barrier Film (skin prep);Dakin's-soaked gauze;Foam - Lift dressing to assess site every shift;Gauze (Comment);Moist to dry 08/20/21 1331  Dressing Changed;Clean;Dry;Intact 08/20/21 1331  Dressing Change Frequency Daily 08/20/21 1331  State of Healing Eschar 08/20/21 1331  Site / Wound Assessment Brown;Red;Yellow 08/20/21 1331  % Wound base Red or Granulating 30% 08/20/21 1331  % Wound base Yellow/Fibrinous Exudate 60% 08/20/21 1331  % Wound base Black/Eschar 10% 08/20/21 1331  % Wound base Other/Granulation Tissue (Comment) 0% 08/20/21 1331  Peri-wound Assessment Bleeding;Induration 08/20/21 1331  Wound Length (cm) 7 cm 08/13/21 1440  Wound Width (cm) 6 cm 08/13/21 1440  Wound Depth (cm) 0 cm 08/13/21 1440  Wound Surface Area (cm^2) 42 cm^2 08/13/21 1440  Wound Volume (cm^3) 0 cm^3 08/13/21 1440  Margins Unattached edges (unapproximated) 08/20/21 1331  Drainage Amount Moderate 08/20/21 1331  Drainage Description Green;Other (Comment) 08/20/21 1331  Treatment Cleansed;Debridement (Selective);Hydrotherapy (Pulse lavage);Packing (Saline gauze) 08/20/21 1331      Hydrotherapy Pulsed lavage  therapy - wound location: sacral Pulsed Lavage with Suction (psi): 8 psi (8=12) Pulsed Lavage with Suction - Normal Saline Used: 1000 mL Pulsed Lavage Tip: Tip with splash shield Selective Debridement Selective Debridement - Location: sacral Selective Debridement - Tools Used: Forceps, Scalpel, Scissors Selective Debridement - Tissue Removed: eschar    Wound Assessment and Plan  Wound Therapy - Assess/Plan/Recommendations Wound Therapy - Clinical Statement: pt benefiting from hydrotherapy for pulsed lavage to cleanse and soften wound/eschar for selective debridement and to decrease bio burden.  Urinary incontinence measures improved.  Continuing fungal powder.  Continues with blue green exudate,  being managed by Verdene Lennert. Wound Therapy - Functional Problem List: up to 4 weeks or more relatively immobile in bed. Factors Delaying/Impairing Wound Healing: Diabetes Mellitus, Incontinence, Infection - systemic/local, Immobility, Multiple medical problems Hydrotherapy Plan: Debridement, Dressing change, Patient/family education, Pulsatile lavage with suction Wound Therapy - Frequency: 6X / week Wound Therapy - Current Recommendations: PT Wound Therapy - Follow Up Recommendations: dressing changes by family/patient, dressing changes by RN  Wound Therapy Goals- Improve the function of patient's integumentary system by progressing the wound(s) through the phases of wound healing (inflammation - proliferation - remodeling) by: Wound Therapy Goals - Improve the function of patient's integumentary system by progressing the wound(s) through the phases of wound healing by: Decrease Necrotic Tissue to: 20% Decrease Necrotic Tissue - Progress: Progressing toward goal Increase Granulation Tissue to: 80% Increase Granulation Tissue - Progress: Progressing toward goal Goals/treatment plan/discharge plan were made with and agreed upon by patient/family: Yes Time For Goal Achievement: 7 days Wound Therapy -  Potential for Goals: Good  Goals will be updated until maximal potential achieved or discharge criteria met.  Discharge criteria: when goals  achieved, discharge from hospital, MD decision/surgical intervention, no progress towards goals, refusal/missing three consecutive treatments without notification or medical reason.  GP     Charges PT Wound Care Charges $Wound Debridement up to 20 cm: < or equal to 20 cm $ Wound Debridement each add'l 20 sqcm: 1 $PT Hydrotherapy Dressing: 1 dressing $PT PLS Gun and Tip: 1 Supply $PT Hydrotherapy Visit: 1 Visit       [name] 08/20/2021, 1:50 PM 08/20/2021  [name]., PT Acute Rehabilitation Services [phone]  (pager) [phone]  (office)

## 2021-08-20 NOTE — Progress Notes (Signed)
Patient seen and examined.  He is complaining of more generalized back pain today.  He thinks it is the bed.  He thinks it is uncomfortable.  He denies pain in his thighs or numbness or tingling in his legs.  He is moving his legs well in bed.  He, in fact, has improved strength in his left lower extremity compared to previous.  There is no tenderness to palpation along the spine and no obvious deformity of the spine when he rolls up on his side.  We talked about the utility of another MRI of the thoracic spine with and without contrast but he would like to maybe try a new bed and get out of bed today and see how things go first.  I think that is reasonable.  We will check a sed rate and CRP today as they were 37 and 1.4 a week ago.

## 2021-08-20 NOTE — Progress Notes (Signed)
PROGRESS NOTE  Rucker Pridgeon  VZC:[id number] DOB: 09/18/1965 DOA: 07/21/2021 PCP: Patient, No Pcp Per (Inactive)   Brief Narrative:  55 year old male with history of diabetes mellitus type 2, diabetic foot infection with prior right BKA, chronic diastolic CHF, hypertension, dyslipidemia presented with worsening left foot swelling/discoloration and discharge.  On presentation, he was hypotensive, tachycardic with A. fib with RVR with CT suggestive of first MTP osteomyelitis and soft tissue gas concerning for possible necrotizing fasciitis along with AKI with creatinine of 2.7 and mild DKA.  His blood pressures did not improve with IV fluids and he was transferred to ICU and PCCM was consulted.  Orthopedics was consulted.  He underwent left transtibial amputation on 07/23/2021.  He was found to have strep agalactiae bacteremia.  ID was consulted.  He was found to have vertebral discitis versus osteomyelitis on MRI.  He was also found to have new systolic dysfunction with LVEF of 35% along with new onset atrial flutter requiring IV amiodarone which has been switched to oral.  Cardiology was consulted.  He has been started on Eliquis.  S/p TEE followed by DCCV on 11/22.  Developed worsening back pain 11/26-11/27, ID reconsulted, requested MRI T/L-spine, ultimately performed 12/1, with contrast which showed Significant progression of discitis and osteomyelitis at T5 through T8. There is edema extending into the posterior elements. Extensive posterior epidural abscess has progressed since the prior study. Abscess extends from T3 through T9 and is exerting mass-effect on the cord with moderate spinal stenosis T4 through T8.   Due to report of pleural effusions on MRI, thoracentesis was attempted but there was insufficient fluid volume.   Neurosurgery, Dr. Ronnald Ramp, has evaluated the patient recommends no surgery. ID has changed antibiotics to vancomycin with plans for protracted IV abx course thru PICC. He  continues to have PT regularly, getting hydrotherapy for sacral wound which appeared to have infection for which cefepime was added.   Assessment & Plan: Principal Problem:   Discitis of thoracic region Active Problems:   Essential hypertension   Stage 3a chronic kidney disease (HCC)   Type 2 diabetes mellitus with diabetic polyneuropathy, with long-term current use of insulin (HCC)   Right below-knee amputee (HCC)   Diabetic infection of left foot (HCC)   Chronic diastolic (congestive) heart failure (HCC)   Severe sepsis with acute organ dysfunction (HCC)   Chest pain   AKI (acute kidney injury) ([place])   Gas gangrene of foot (HCC)   Atrial flutter (HCC)   Septic shock (HCC)   Systolic dysfunction   Pressure injury of skin   MSSA bacteremia  Septic shock: Evolving on admission from bacteremia/diabetic foot infection  - Off pressors.  Shock resolved.   Diabetic left foot infection: Status post left transtibial amputation on 07/23/2021. - Wound/wound VAC DC'ed per Dr. Sharol Given, recommends dry dressing which has been applied.    Group B streptococcus bacteremia, progressive thoracic discitis/osteomyelitis. No vegetation on TTE or TEE during cardioversion.  MRI repeated with contrast on 12/1 showed Significant progression of discitis and osteomyelitis at T5 through T8 and extensive posterior epidural abscess has progressed since the prior study extending T3 through T9 exerting mass-effect on the cord with moderate spinal stenosis T4 through T8. - Neurosurgery consulted, continues to recommend IV antibiotics and advise against surgery in this difficult situation. Fortunately, there is clinical improvement with this strategy. Will need surveillance imaging soon per NSG, Dr. Ronnald Ramp. - Continue IV abx thru PICC (placed 12/7). Total duration TBD. Cleared blood cultures 07/24/2021.  Sacral pressure wound: Infection noted 12/8. D/w ID, culture is unlikely to be helpful here. Not becoming septic,  WBC and temperature remain wnl.  - Started cefepime to include pseudomonas coverage, plan 7 days. - Continue offloading/turning, and getting OOB. - Continue topical nystatin -Continue with hydrotherapy  New onset systolic CHF: Echo showed EF of 35% (previous echo showed EF of 50 to 55% in 04/2021). Cardiomyopathy may be from new onset atrial flutter RVR related tachycardia versus stress induced cardiomyopathy from bacteremia and septic shock. - Cardiology no longer following, recommended torsemide 84m QOD for now. - Continue metop succinate, bidil. CrCl limited ACE/ARB/ARNI and MRA. - HF navigator consulted.  New onset atrial flutter: Maintained NSR since DCCV 11/22 - Continue metoprolol succinate 531mdaily. - Continue amiodarone 20038maily - Restarted eliquis since no procedural plans, preferably uninterrupted anticoagulation for 1 month post DCCV.  Acute urinary retention:  - Pt reports retention-type symptoms chronically and has repeatedly required I&O caths which are met with significant resistance. Strong suspicion for BPH. Pt also reports hx penis fracture not repaired surgically years ago. -Started on finasteride, tamsulosin, Foley catheter inserted 12/13, he will need voiding trial in few days , will need to follow with urology as an outpatient regardless.    T2DM uncontrolled with hyperglycemia:  - Continue basal-bolus insulin. Fasting hyperglycemia with better control postprandially. Increase basal, decrease mealtime novolog.  AKI on stage IIIa CKD: AKI resolving with limitation on diuresis. - Monitor intermittently while on vancomycin. CrCl now improved.   Pt takes testosterone/anabolic steroids (not prescription):  - Free level checked 11/29, low at 4.0. Rechecked 12/5, normal at 8.8. Albumin is 1.6 making total level falsely low.   Hyponatremia: Improved.   Hypomagnesemia: Improved with supplementation.  - Follow periodically.   Leukocytosis: Resolved.    Thrombocytopenia: Resolved  History of incomplete bladder emptying:  - Check postvoid residual - Follow up with urology as outpatient (vs. inpatient if retaining)   Generalized deconditioning - PT recommends CIR vs. SNF. Pt making gains and working with PT. We discussed with CIR who will follow the patient for admission once he meets criteria. Unable to DC to SNF while undergoing hydrotherapy.   Acute on chronic pain: Follows with Dr. RamNelva Bushain management.  Has received steroid injections to the back which, we've confirmed, will be delayed until completion of IV antibiotics. - Reviewed [place] PDMP and patient regularly fills opioid prescriptions. Likely opioid-dependent. - Obviously has reasons for acute pain from recent surgery, discitis/osteomyelitis. Opioid tolerance PTA and may be increasing here. Bowel regimen also ordered. - Continue multimodal pain control as ordered: - Tylenol 1 g 3 times daily. - Oxy IR 10 mg every 4 hourly as needed for moderate pain - OxyContin 15 Mg every 12 hours - Lidocaine patch increased to 2 patches - Dilaudid 0.5 mg IV every 4 hourly as needed severe pain. Discussed need to limit/discontinue this, though prior to hydrotherapy will remain available. - Gabapentin 300 mg 3 times daily   Insomnia - Continue trazodone 100m63mS  Liver nodularity: Noted incidentally on CT chest 04/09/2021. Hypoalbuminemia noted. No ascites or LE edema, or signs of hepatic encephalopathy. Ferritin wnl. - INR was elevated to 1.4 when checked 11/14.   Pulmonary nodules: Unchanged on last CT chest 5mm 66m smalled definitively benign, no further evaluation recommended.  Reported renal lesion: Per pt. Follows with Dr. HerriLouis MeckelFollow up with Dr. HerriLouis Meckelmmended.   Pleural effusion: Chronic, multifactorial partially related to CHF and to hypoalbuminemia.  Not sufficient for thoracentesis.   RN Pressure Injury Documentation: Pressure Injury 07/27/21 Sacrum Mid Unstageable -  Full thickness tissue loss in which the base of the injury is covered by slough (yellow, tan, gray, green or brown) and/or eschar (tan, brown or black) in the wound bed. small pink pressure ulcer with bre (Active)  07/27/21 0255  Location: Sacrum  Location Orientation: Mid  Staging: Unstageable - Full thickness tissue loss in which the base of the injury is covered by slough (yellow, tan, gray, green or brown) and/or eschar (tan, brown or black) in the wound bed.  Wound Description (Comments): small pink pressure ulcer with break in skin 08/12/21; updated to unstageable pressure injury  Present on Admission: Yes   DVT prophylaxis: Eliquis Code Status: Full Family Communication: None at bedside. Disposition Plan:  Status is: Inpatient  Remains inpatient appropriate because: Unsafe DC. Needs intensive PT and hydrotherapy, though SNF cannot take him while pursuing hydrotherapy.  Consultants:  ID Neurosurgery PCCM Cardiology  Procedures:  Left TTA 07/23/2021 TEE/DCCV 07/29/2021 PICC RUE 08/13/2021  Antimicrobials: Cefazolin 11/16 Ceftriaxone 11/14, 11/16-17 Clindamycin 11/15-16 Linezolid 11/15 Pen G 11/18 Pip-tazo 11/15-11/16 Vancomycin 11/14 Cefepime 12/8 - 12/12  Subjective:  Patient required Foley catheter insertion yesterday, upper back pain remains the same, he still complaining of lower back pain which is a chronic problem.  Objective: Vitals:   08/19/21 1959 08/19/21 2341 08/20/21 0338 08/20/21 0804  BP: 135/79 127/77 115/61 123/68  Pulse:   (!) 53   Resp: '13 15 15 14  ' Temp: 98.6 F (37 C) 98.4 F (36.9 C) 98.5 F (36.9 C) 98.4 F (36.9 C)  TempSrc: Oral Oral Oral Oral  SpO2:   98%   Weight:      Height:        Intake/Output Summary (Last 24 hours) at 08/20/2021 1034 Last data filed at 08/20/2021 0829 Gross per 24 hour  Intake 120 ml  Output 5960 ml  Net -5840 ml   Filed Weights   07/27/21 0252 07/29/21 0754 08/01/21 0459  Weight: 108.2 kg 108.2 kg  107 kg      Awake Alert, Oriented X 3, No new F.N deficits, Normal affect Symmetrical Chest wall movement, Good air movement bilaterally, CTAB RRR,No Gallops,Rubs or new Murmurs, No Parasternal Heave +ve B.Sounds, Abd Soft, No tenderness, No rebound - guarding or rigidity. No Cyanosis, Clubbing or edema, B/L lower ext s/p BKA  Data Reviewed: I have personally reviewed following labs and imaging studies  CBC: Recent Labs  Lab 08/14/21 0110 08/17/21 0700 08/20/21 0330  WBC 7.3 6.6 7.3  HGB 12.9* 13.1 12.8*  HCT 40.6 40.9 39.8  MCV 90.8 91.5 90.7  PLT 321 304 888   Basic Metabolic Panel: Recent Labs  Lab 08/15/21 0821 08/17/21 0700 08/20/21 0330  NA 133* 133* 131*  K 4.0 4.3 3.8  CL 100 101 99  CO2 '25 25 26  ' GLUCOSE 268* 268* 206*  BUN 23* 23* 27*  CREATININE 1.14 1.20 1.33*  CALCIUM 8.3* 8.4* 8.2*  MG 1.5*  --   --    GFR: Estimated Creatinine Clearance: 83 mL/min (A) (by C-G formula based on SCr of 1.33 mg/dL (H)). Liver Function Tests: Recent Labs  Lab 08/17/21 0700 08/20/21 0330  AST 21 20  ALT 39 33  ALKPHOS 102 108  BILITOT 0.5 0.4  PROT 5.8* 5.8*  ALBUMIN 1.8* 1.9*   No results for input(s): LIPASE, AMYLASE in the last 168 hours. No results for input(s): AMMONIA in the  last 168 hours. Coagulation Profile: No results for input(s): INR, PROTIME in the last 168 hours. Cardiac Enzymes: No results for input(s): CKTOTAL, CKMB, CKMBINDEX, TROPONINI in the last 168 hours. BNP (last 3 results) No results for input(s): PROBNP in the last 8760 hours. HbA1C: No results for input(s): HGBA1C in the last 72 hours. CBG: Recent Labs  Lab 08/19/21 0634 08/19/21 1144 08/19/21 1616 08/19/21 2202 08/20/21 0615  GLUCAP 174* 218* 193* 226* 153*   Lipid Profile: No results for input(s): CHOL, HDL, LDLCALC, TRIG, CHOLHDL, LDLDIRECT in the last 72 hours. Thyroid Function Tests: No results for input(s): TSH, T4TOTAL, FREET4, T3FREE, THYROIDAB in the last 72  hours. Anemia Panel: No results for input(s): VITAMINB12, FOLATE, FERRITIN, TIBC, IRON, RETICCTPCT in the last 72 hours.  Urine analysis:    Component Value Date/Time   COLORURINE YELLOW 07/22/2021 0250   APPEARANCEUR CLOUDY (A) 07/22/2021 0250   LABSPEC 1.018 07/22/2021 0250   PHURINE 5.0 07/22/2021 0250   GLUCOSEU >=500 (A) 07/22/2021 0250   HGBUR SMALL (A) 07/22/2021 0250   BILIRUBINUR NEGATIVE 07/22/2021 0250   KETONESUR 5 (A) 07/22/2021 0250   PROTEINUR 30 (A) 07/22/2021 0250   UROBILINOGEN 0.2 09/13/2019 0906   NITRITE NEGATIVE 07/22/2021 0250   LEUKOCYTESUR TRACE (A) 07/22/2021 0250   No results found for this or any previous visit (from the past 240 hour(s)).    Radiology Studies: No results found.  Scheduled Meds:  acetaminophen  1,000 mg Oral TID   amiodarone  200 mg Oral Daily   apixaban  5 mg Oral BID   vitamin C  1,000 mg Oral Daily   Chlorhexidine Gluconate Cloth  6 each Topical Daily   collagenase   Topical Daily   docusate sodium  100 mg Oral Daily   finasteride  5 mg Oral Daily   gabapentin  300 mg Oral TID   insulin aspart  0-15 Units Subcutaneous TID WC   insulin aspart  0-5 Units Subcutaneous QHS   insulin aspart  3 Units Subcutaneous TID WC   insulin detemir  33 Units Subcutaneous BID   isosorbide mononitrate  30 mg Oral Daily   lidocaine  2 patch Transdermal Q24H   methocarbamol  1,000 mg Oral Q8H   metoprolol succinate  50 mg Oral Daily   multivitamin with minerals  1 tablet Oral Daily   nutrition supplement (JUVEN)  1 packet Oral BID BM   nystatin cream   Topical BID   oxyCODONE  15 mg Oral Q12H   polyethylene glycol  17 g Oral Daily   Ensure Max Protein  11 oz Oral BID BM   sodium chloride flush  10-40 mL Intracatheter Q12H   tamsulosin  0.4 mg Oral Daily   torsemide  20 mg Oral QODAY   Continuous Infusions:  sodium chloride Stopped (08/13/21 0457)   ceFEPime (MAXIPIME) IV 2 g (08/20/21 0612)   vancomycin 750 mg (08/20/21 0447)      LOS: 59 days    Phillips Climes, MD [facility] [email or web address] 08/20/2021, 10:34 AM

## 2021-08-20 NOTE — Progress Notes (Signed)
OT Cancellation Note  Patient Details Name: [name] MRN: [id number] DOB: November 28, 1965   Cancelled Treatment:    Reason Eval/Treat Not Completed: Patient declined, no reason specified (Patient states that his back is currently hurting too bad to attempt therapy and asked that we try again later today.) [name], [place]  Pager [phone] Office [phone]  Trixie Dredge 08/20/2021, 12:13 PM

## 2021-08-20 NOTE — Progress Notes (Signed)
Physical Therapy Treatment Patient Details Name: [name] MRN: [id number] DOB: 07-08-1966 Today's Date: 08/20/2021   History of Present Illness Pt is a 55 y.o. 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 S/p TEE, DCCV on 11/22. Chest CT 12/4 with pleural effusion; thoracentesis attempted with insufficient fluid volume. PMH includes R BKA, aflutter, DM, HTN, depression, anxiety.    PT Comments    Pt with increased upper back pain today, reporting he believes it may be due to the bed. Adjusted bed to pt's desired level of firmness. Treated pt in conjunction with OT  to maximize quality and safety of session while trying to progress pt to OOB mobility this date. Pt initially agreeable to get OOB to chair, but after sitting EOB he became fatigued and reported too much pain to try to perform anterior-posterior transfers. However, pt making good progress with sitting balance, only needing min-modA majority of time to maintain balance at EOB with UE support. Pt also able to progress to no UE support and sit up with min guard assist x4 bouts of ~3 sec each, providing tactile and verbal cues to facilitate core activation. Will plan for hoyer-lift transfer next session as tolerable. Will continue to follow acutely. Current recommendations remain appropriate.    Recommendations for follow up therapy are one component of a multi-disciplinary discharge planning process, led by the attending physician.  Recommendations may be updated based on patient status, additional functional criteria and insurance authorization.  Follow Up Recommendations  Acute inpatient rehab (3hours/day)     Assistance Recommended at Discharge Frequent or constant Supervision/Assistance  Equipment  Recommendations  Wheelchair (measurements PT);Wheelchair cushion (measurements PT);Other (comment) (bilateral BKA w/c assessment; may need slide board and drop arm BSC?; further assessment post acute)    Recommendations for Other Services       Precautions / Restrictions Precautions Precautions: Fall;Back Precaution Comments: Back precautions for comfort Required Braces or Orthoses: Other Brace Other Brace: LLE limb guard; has RLE prosthetic in room (not fitting currently per pt?) Restrictions Weight Bearing Restrictions: Yes LLE Weight Bearing: Non weight bearing Other Position/Activity Restrictions: RLE prosthetic is not fitting currently per pt     Mobility  Bed Mobility Overal bed mobility: Needs Assistance Bed Mobility: Rolling;Sidelying to Sit;Sit to Supine Rolling: Min assist Sidelying to sit: Max assist;+2 for safety/equipment   Sit to supine: Max assist;+2 for physical assistance   General bed mobility comments: increased pain on this date with patient requiring more assistance with bed mobility, minA to rotate hips fully to roll. MaxAx2 to control trunk and pivot buttocks on bed pads for sidelying <> sit, going at slow speed to accomodate for pt's pain. Exited L side of bed.    Transfers Overall transfer level: Needs assistance                 General transfer comment: unable to attempt due to pain and fatigue from sitting on EOB, but pt was initially motivated to try until tried to scoot anteriorly. Pt reporting too much pain scooting posteriorly.    Ambulation/Gait               General Gait Details: Unable   Stairs             Wheelchair Mobility    Modified Rankin (Stroke Patients Only)  Balance Overall balance assessment: Needs assistance Sitting-balance support: Single extremity supported;Bilateral upper extremity supported;No upper extremity supported Sitting balance-Leahy Scale: Fair Sitting balance - Comments: min to mod  assist posteriorly for sitting balance on EOB.  Mini-sit ups performed x4 with patient tolerating 3 seconds each of unsupported sitting with min guard, no UE support. Postural control: Posterior lean     Standing balance comment: Unable                            Cognition Arousal/Alertness: Awake/alert Behavior During Therapy: WFL for tasks assessed/performed;Anxious;Agitated (anxious and agitated in regards to pain) Overall Cognitive Status: No family/caregiver present to determine baseline cognitive functioning Area of Impairment: Attention;Safety/judgement;Awareness;Problem solving                   Current Attention Level: Sustained     Safety/Judgement: Decreased awareness of deficits Awareness: Emergent Problem Solving: Requires verbal cues General Comments: following commands for body mechanics but increased time due to back pain.        Exercises Other Exercises Other Exercises: mini sit-ups from therapist posterior to him at EOB x4 with patient tolerating 3 seconds each with min guard, min active ROM noted in core    General Comments        Pertinent Vitals/Pain Pain Assessment: Faces Faces Pain Scale: Hurts whole lot Pain Location: Low back Pain Descriptors / Indicators: Grimacing;Guarding;Moaning Pain Intervention(s): Limited activity within patient's tolerance;Monitored during session;Repositioned    Home Living                          Prior Function            PT Goals (current goals can now be found in the care plan section) Acute Rehab PT Goals Patient Stated Goal: to go to CIR PT Goal Formulation: With patient Time For Goal Achievement: 09/01/21 Potential to Achieve Goals: Fair Progress towards PT goals: Progressing toward goals    Frequency    Min 3X/week      PT Plan Current plan remains appropriate    Co-evaluation PT/OT/SLP Co-Evaluation/Treatment: Yes Reason for Co-Treatment: Necessary to address  cognition/behavior during functional activity;For patient/therapist safety;To address functional/ADL transfers PT goals addressed during session: Mobility/safety with mobility;Balance;Strengthening/ROM OT goals addressed during session: Other (comment) (bed mobility)      AM-PAC PT "6 Clicks" Mobility   Outcome Measure  Help needed turning from your back to your side while in a flat bed without using bedrails?: A Little Help needed moving from lying on your back to sitting on the side of a flat bed without using bedrails?: Total Help needed moving to and from a bed to a chair (including a wheelchair)?: Total Help needed standing up from a chair using your arms (e.g., wheelchair or bedside chair)?: Total Help needed to walk in hospital room?: Total Help needed climbing 3-5 steps with a railing? : Total 6 Click Score: 8    End of Session   Activity Tolerance: Patient limited by pain;Patient tolerated treatment well Patient left: in bed;with call bell/phone within reach Nurse Communication: Mobility status PT Visit Diagnosis: Other abnormalities of gait and mobility (R26.89);Muscle weakness (generalized) (M62.81);Pain Pain - part of body:  (back)     Time: 2585-2778 PT Time Calculation (min) (ACUTE ONLY): 37 min  Charges:  $Neuromuscular Re-education: 8-22 mins  Moishe Spice, PT, DPT Acute Rehabilitation Services  Pager: [phone] Office: [place] 08/20/2021, 5:07 PM

## 2021-08-20 NOTE — Progress Notes (Signed)
Nutrition Follow-up  DOCUMENTATION CODES:   Not applicable  INTERVENTION:   - Continue Ensure Max po BID, each supplement provides 150 kcal and 30 grams of protein  - Continue MVI with minerals daily  - Recommend obtaining updated weight  - d/c Juven BID as pt is refusing  NUTRITION DIAGNOSIS:   Increased nutrient needs related to wound healing as evidenced by estimated needs.  Ongoing, being addressed via oral nutrition supplements  GOAL:   Patient will meet greater than or equal to 90% of their needs  Progressing  MONITOR:   PO intake, Supplement acceptance, Labs, Weight trends, Skin, I & O's  REASON FOR ASSESSMENT:   Consult Assessment of nutrition requirement/status  ASSESSMENT:   55 yo male with a PMH of diabetes mellitus type 2, diabetic foot infection with prior right BKA, chronic diastolic CHF, hypertension, dyslipidemia presented with worsening left foot swelling/discoloration and discharge. Admitted with discitis of thoracic region.  Noted DTPI to sacrum has evolved to an unstageable pressure injury. Pt currently receiving hydrotherapy for wound.  Attempted to speak with pt at bedside x 2. Pt unavailable on both attempts. Pt accepting Ensure Max supplements but has been refusing Juven per [facility] records. RD to d/c Juven. Will continue with Ensure Max and MVI with minerals to aid pt in meeting kcal and protein needs. Pt currently consuming 100% of all meals.  No documented weights since 08/01/21.  Meal Completion: 100%  Medications reviewed and include: vitamin C 1000 mg daily, colace, SSI, novolog 3 units TID with meals, levemir 33 units BID, MVI with minerals daily, Juven BID, miralax, Ensure Max BID, torsemide, IV abx  Labs reviewed: sodium 131, BUN 27, creatinine 1.33 CBG's: 210-265 x 24 hours  UOP: 4000 ml x 24 hours I/O's: -56.0 L since admit  Diet Order:   Diet Order             Diet Carb Modified Fluid consistency: Thin; Room service  appropriate? Yes  Diet effective now                   EDUCATION NEEDS:   Education needs have been addressed  Skin:  Skin Assessment: Skin Integrity Issues: Unstageable: coccyx/sacrum Incisions: L leg, closed (11/16)  Last BM:  08/20/21 medium type 4  Height:   Ht Readings from Last 1 Encounters:  07/29/21 6\' 3"  (1.905 m)    Weight:   Wt Readings from Last 1 Encounters:  08/01/21 107 kg    BMI:  Body mass index is 29.48 kg/m.  Estimated Nutritional Needs:   Kcal:  2300-2500  Protein:  135-150 grams  Fluid:  >2.3 L    Gustavus Bryant, MS, RD, LDN Inpatient Clinical Dietitian Please see AMiON for contact information.

## 2021-08-20 NOTE — TOC Progression Note (Signed)
Transition of Care [facility]) - Progression Note    Patient Details  Name: Viviano Bir MRN: [id number] Date of Birth: Feb 13, 1966  Transition of Care [facility] CM/SW Contact  Zenon Mayo, RN Phone Number: 08/20/2021, 3:37 PM  Clinical Narrative:    NCM spoke with patient at the bedside, informed him that CIR has signed off right now, asked how he felt about going to long term acute facility , he asked if it was located here in the hospital. NCM states yes the one here in the hospital is [facility].  He states yes he would like to go there as long as he gets everything that he is getting now in the hospital. He states they can go ahead and start insurance auth. Anderson Malta with [facility] states she will be down tomorrow to speak with him.   Expected Discharge Plan: [place] Barriers to Discharge: Continued Medical Work up  Expected Discharge Plan and Services Expected Discharge Plan: [place] In-house Referral: Clinical Social Work Discharge Planning Services: CM Consult, Follow-up appt scheduled Post Acute Care Choice: [place] arrangements for the past 2 months: Apartment                                       Social Determinants of Health (SDOH) Interventions    Readmission Risk Interventions No flowsheet data found.

## 2021-08-20 NOTE — Plan of Care (Signed)
°  Problem: Coping: Goal: Level of anxiety will decrease Outcome: Progressing   Problem: Elimination: Goal: Will not experience complications related to bowel motility Outcome: Progressing   Problem: Elimination: Goal: Will not experience complications related to bowel motility Outcome: Progressing Goal: Will not experience complications related to urinary retention Outcome: Progressing

## 2021-08-20 NOTE — Progress Notes (Signed)
We saw him again tonight and his pain is much better with changing the setting on the bed. He looks comfortable and is moving his legs well

## 2021-08-20 NOTE — Progress Notes (Signed)
Inpatient Diabetes Program Recommendations  AACE/ADA: New Consensus Statement on Inpatient Glycemic Control (2015)  Target Ranges:  Prepandial:   less than 140 mg/dL      Peak postprandial:   less than 180 mg/dL (1-2 hours)      Critically ill patients:  140 - 180 mg/dL   Lab Results  Component Value Date   GLUCAP 242 (H) 08/20/2021   HGBA1C 13.8 (H) 07/22/2021    Review of Glycemic Control  Latest Reference Range & Units 08/19/21 06:34 08/19/21 11:44 08/19/21 16:16 08/19/21 22:02 08/20/21 06:15 08/20/21 11:15  Glucose-Capillary 70 - 99 mg/dL 174 (H) 218 (H) 193 (H) 226 (H) 153 (H) 242 (H)  (H): Data is abnormally high    Inpatient Diabetes Program Recommendations:    Please consider increasing meal coverage: Novolog 5 units TID with meals if eats at lest 50%  Will continue to follow while inpatient.  Thank you, Reche Dixon, RN, BSN Diabetes Coordinator Inpatient Diabetes Program [phone] (team pager from 8a-5p)

## 2021-08-20 NOTE — Progress Notes (Signed)
PT Cancellation Note  Patient Details Name: [name] MRN: [id number] DOB: February 20, 1966   Cancelled Treatment:    Reason Eval/Treat Not Completed: Pain limiting ability to participate. Pt reporting too much pain, now in his upper back, requesting PT and OT return later in afternoon for re-attempt.   Moishe Spice, PT, DPT Acute Rehabilitation Services  Pager: [phone] Office: [place] 08/20/2021, 12:17 PM

## 2021-08-20 NOTE — Progress Notes (Signed)
Inpatient Rehab Admissions Coordinator:   Per [facility] request, I reviewed [name] for possible consideration for CIR again.  I spoke with therapy last week to let them know I would need to see him consistently transferring out of bed at every opportunity given to demonstrate participation and tolerance for CIR.  Three therapy sessions have occurred since that time and he has declined OOB mobility each time.  I do not feel he can tolerate CIR at this time and would not consider him a candidate.  I let RNCM know, pt will need to f/u with alternative level of care for therapy.    Shann Medal, PT, DPT Admissions Coordinator [phone] 08/20/21  8:50 AM

## 2021-08-20 NOTE — Progress Notes (Signed)
Occupational Therapy Treatment Patient Details Name: Kiyoshi Schaab MRN: [id number] DOB: September 26, 1965 Today's Date: 08/20/2021   History of present illness Pt is a 55 y.o. 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 S/p TEE, DCCV on 11/22. Chest CT 12/4 with pleural effusion; thoracentesis attempted with insufficient fluid volume. PMH includes R BKA, aflutter, DM, HTN, depression, anxiety.   OT comments  Patient received in bed and had increased complaints of back pain which he believes from the bed. Patient stated he continues to want to try to get into chair but unsure due to pain. Patient required max assist +2 and increased time to get to EOB.  Patient believed he had too much pain to attempt chair transfer and perform mini sit-ups to increase core strength with patient performing 4 sit-ups tolerating 3 seconds with min guard for balance. Discussed possibly using lift to assist with chair transfers for future visits. Acute OT to continue to follow.    Recommendations for follow up therapy are one component of a multi-disciplinary discharge planning process, led by the attending physician.  Recommendations may be updated based on patient status, additional functional criteria and insurance authorization.    Follow Up Recommendations  Skilled nursing-short term rehab (<3 hours/day)    Assistance Recommended at Discharge Frequent or constant Supervision/Assistance  Equipment Recommendations  None recommended by OT    Recommendations for Other Services      Precautions / Restrictions Precautions Precautions: Fall;Back Precaution Comments: Back precautions for comfort Required Braces or Orthoses: Other Brace Other Brace: LLE limb guard; has RLE prosthetic in room (not  fitting currently per pt?)       Mobility Bed Mobility Overal bed mobility: Needs Assistance Bed Mobility: Rolling;Sidelying to Sit;Sit to Supine Rolling: Min assist Sidelying to sit: Max assist;+2 for safety/equipment   Sit to supine: Max assist;+2 for physical assistance   General bed mobility comments: increased pain on this date with patient requiring more assistance with bed mobility    Transfers Overall transfer level: Needs assistance                 General transfer comment: unable to attempt due to pain and fatigue from sitting on EOB     Balance Overall balance assessment: Needs assistance Sitting-balance support: Single extremity supported;Bilateral upper extremity supported Sitting balance-Leahy Scale: Poor Sitting balance - Comments: min to mod assist for sitting balance on EOB.  Mini-sit ups performed x4 wtih patient tolerating 3 seconds each of unsupported sitting with min guard. Postural control: Posterior lean                                 ADL either performed or assessed with clinical judgement   ADL Overall ADL's : Needs assistance/impaired                 Upper Body Dressing : Minimal assistance;Bed level Upper Body Dressing Details (indicate cue type and reason): donning new gown                   General ADL Comments: new gown donned during supine. focused on bed mobilty and sitting balance    Extremity/Trunk Assessment              Vision  Perception     Praxis      Cognition Arousal/Alertness: Awake/alert Behavior During Therapy: WFL for tasks assessed/performed Overall Cognitive Status: No family/caregiver present to determine baseline cognitive functioning Area of Impairment: Attention;Safety/judgement;Awareness;Problem solving                   Current Attention Level: Sustained     Safety/Judgement: Decreased awareness of deficits Awareness: Emergent Problem Solving: Requires  verbal cues General Comments: following commands for body mechanics but increased time due to back pain          Exercises Exercises: Other exercises Other Exercises Other Exercises: mini sit-ups from EOB x4 with patient tolerating 3 seconds each with min guard   Shoulder Instructions       General Comments      Pertinent Vitals/ Pain       Pain Assessment: Faces Faces Pain Scale: Hurts whole lot Pain Location: Low back Pain Descriptors / Indicators: Grimacing;Guarding;Moaning Pain Intervention(s): Monitored during session;Repositioned;Limited activity within patient's tolerance  Home Living                                          Prior Functioning/Environment              Frequency  Min 3X/week        Progress Toward Goals  OT Goals(current goals can now be found in the care plan section)  Progress towards OT goals: Progressing toward goals  Acute Rehab OT Goals Patient Stated Goal: get into chair OT Goal Formulation: With patient Time For Goal Achievement: 08/20/21 Potential to Achieve Goals: Fair ADL Goals Pt Will Perform Grooming: sitting;with supervision Pt Will Perform Upper Body Bathing: with supervision;sitting Pt Will Perform Lower Body Dressing: with min assist;bed level;sitting/lateral leans Pt Will Transfer to Toilet: with mod assist;bedside commode;anterior/posterior transfer;with transfer board Pt/caregiver will Perform Home Exercise Program: Increased ROM;Left upper extremity;With written HEP provided Additional ADL Goal #1: Pt will complete bed mobility with mod assist as precursor to ADLs.  Plan Discharge plan needs to be updated;Frequency remains appropriate    Co-evaluation    PT/OT/SLP Co-Evaluation/Treatment: Yes Reason for Co-Treatment: Necessary to address cognition/behavior during functional activity;For patient/therapist safety;To address functional/ADL transfers   OT goals addressed during session: Other  (comment) (bed mobility)      AM-PAC OT "6 Clicks" Daily Activity     Outcome Measure   Help from another person eating meals?: A Little Help from another person taking care of personal grooming?: A Little Help from another person toileting, which includes using toliet, bedpan, or urinal?: Total Help from another person bathing (including washing, rinsing, drying)?: A Lot Help from another person to put on and taking off regular upper body clothing?: A Little Help from another person to put on and taking off regular lower body clothing?: Total 6 Click Score: 13    End of Session    OT Visit Diagnosis: Muscle weakness (generalized) (M62.81);Pain;Other abnormalities of gait and mobility (R26.89)   Activity Tolerance Patient limited by fatigue;Patient limited by pain   Patient Left in bed;with call bell/phone within reach   Nurse Communication Mobility status        Time: 0258-5277 OT Time Calculation (min): 37 min  Charges: OT General Charges $OT Visit: 1 Visit OT Treatments $Therapeutic Activity: 8-22 mins  [name], [place]  Pager [phone] Office [phone]   Brycelynn Stampley  L Zelena Bushong 08/20/2021, 2:50 PM

## 2021-08-21 LAB — BASIC METABOLIC PANEL
Anion gap: 5 (ref 5–15)
BUN: 24 mg/dL — ABNORMAL HIGH (ref 6–20)
CO2: 27 mmol/L (ref 22–32)
Calcium: 8.5 mg/dL — ABNORMAL LOW (ref 8.9–10.3)
Chloride: 104 mmol/L (ref 98–111)
Creatinine, Ser: 1.24 mg/dL (ref 0.61–1.24)
GFR, Estimated: >60 mL/min (ref >60)
Glucose, Bld: 242 mg/dL — ABNORMAL HIGH (ref 70–99)
Potassium: 4.1 mmol/L (ref 3.5–5.1)
Sodium: 136 mmol/L (ref 135–145)

## 2021-08-21 LAB — VANCOMYCIN, PEAK: Vancomycin Pk: 26 ug/mL — ABNORMAL LOW (ref 30–40)

## 2021-08-21 LAB — GLUCOSE, CAPILLARY
Glucose-Capillary: 223 mg/dL — ABNORMAL HIGH (ref 70–99)
Glucose-Capillary: 263 mg/dL — ABNORMAL HIGH (ref 70–99)
Glucose-Capillary: 215 mg/dL — ABNORMAL HIGH (ref 70–99)
Glucose-Capillary: 267 mg/dL — ABNORMAL HIGH (ref 70–99)

## 2021-08-21 LAB — VANCOMYCIN, TROUGH
Vancomycin Tr: 22 ug/mL (ref 15–20)
Vancomycin Tr: 18 ug/mL (ref 15–20)

## 2021-08-21 MED ORDER — VANCOMYCIN HCL IN DEXTROSE 1-5 GM/200ML-% IV SOLN
1000.0000 mg | Freq: Two times a day (BID) | INTRAVENOUS | Status: AC
  Administered 2021-08-21 – 2021-09-01 (×23): 1000 mg via INTRAVENOUS
  Filled 2021-08-21 (×24): qty 200

## 2021-08-21 MED ORDER — INSULIN DETEMIR 100 UNIT/ML ~~LOC~~ SOLN
36.0000 [IU] | Freq: Two times a day (BID) | SUBCUTANEOUS | Status: DC
  Administered 2021-08-21 – 2021-09-09 (×39): 36 [IU] via SUBCUTANEOUS
  Filled 2021-08-21 (×46): qty 0.36

## 2021-08-21 MED ORDER — VANCOMYCIN HCL IN DEXTROSE 1-5 GM/200ML-% IV SOLN: 1000.0000 mg | Freq: Two times a day (BID) | INTRAVENOUS | Status: DC

## 2021-08-21 MED ORDER — INSULIN ASPART 100 UNIT/ML IJ SOLN
4.0000 [IU] | Freq: Three times a day (TID) | INTRAMUSCULAR | Status: DC
  Administered 2021-08-21 – 2021-08-23 (×6): 4 [IU] via SUBCUTANEOUS

## 2021-08-21 NOTE — Progress Notes (Signed)
Physical Therapy Wound Treatment Patient Details  Name: [name] MRN: [id number] Date of Birth: 1965/11/27  Today's Date: 08/21/2021 Time: 7867-6720 Time Calculation (min): 59 min  Subjective  Subjective Assessment Subjective: Pt pleasant and agreeable to hydrotherapy Patient and Family Stated Goals: Home to family Date of Onset:  (Unknown) Prior Treatments:  (Dressing changes)  Pain Score:  Pt complains of "soreness" occasionally, however was premedicated and overall tolerated treatment well.   Wound Assessment  Pressure Injury 07/27/21 Sacrum Mid Unstageable - Full thickness tissue loss in which the base of the injury is covered by slough (yellow, tan, gray, green or brown) and/or eschar (tan, brown or black) in the wound bed. small pink pressure ulcer with bre (Active)  Wound Image   08/21/21 1300  Dressing Type Barrier Film (skin prep);Dakin's-soaked gauze;Gauze (Comment);Moist to moist 08/21/21 1300  Dressing Changed;Clean;Dry;Intact 08/21/21 1300  Dressing Change Frequency Daily 08/21/21 1300  State of Healing Non-healing 08/21/21 1300  Site / Wound Assessment Brown;Red;Yellow 08/21/21 1300  % Wound base Red or Granulating 30% 08/21/21 1300  % Wound base Yellow/Fibrinous Exudate 60% 08/21/21 1300  % Wound base Black/Eschar 10% 08/21/21 1300  % Wound base Other/Granulation Tissue (Comment) 0% 08/21/21 1300  Peri-wound Assessment Bleeding;Induration 08/21/21 1300  Wound Length (cm) 7 cm 08/21/21 1300  Wound Width (cm) 6 cm 08/21/21 1300  Wound Depth (cm) 1.8 cm 08/21/21 1300  Wound Surface Area (cm^2) 42 cm^2 08/21/21 1300  Wound Volume (cm^3) 75.6 cm^3 08/21/21 1300  Undermining (cm) 1.7 cm from 11:00-1:00 08/21/21 1300  Margins Unattached edges (unapproximated) 08/21/21 1300  Drainage Amount Moderate 08/20/21 1331  Drainage Description Sanguineous;Green 08/21/21 1300  Treatment Debridement (Selective);Hydrotherapy (Pulse lavage);Packing (Saline gauze) 08/21/21 1300       Hydrotherapy Pulsed lavage therapy - wound location: sacral Pulsed Lavage with Suction (psi): 8 psi (8-12) Pulsed Lavage with Suction - Normal Saline Used: 1000 mL Pulsed Lavage Tip: Tip with splash shield Selective Debridement Selective Debridement - Location: sacral Selective Debridement - Tools Used: Forceps, Scalpel, Scissors Selective Debridement - Tissue Removed: eschar    Wound Assessment and Plan  Wound Therapy - Assess/Plan/Recommendations Wound Therapy - Clinical Statement: Progressing with debridement. Upon removal of dressing from  yesterday, noted bleeding that was not slowing with pressure. Utilized x2 silver nitrate sticks to control bleeding prior to applying new dressing at end of session. Wound Therapy - Functional Problem List: Global weakness Factors Delaying/Impairing Wound Healing: Diabetes Mellitus, Incontinence, Infection - systemic/local, Immobility, Multiple medical problems Hydrotherapy Plan: Debridement, Dressing change, Patient/family education, Pulsatile lavage with suction Wound Therapy - Frequency: 6X / week Wound Therapy - Current Recommendations: PT Wound Therapy - Follow Up Recommendations: dressing changes by family/patient, dressing changes by RN  Wound Therapy Goals- Improve the function of patient's integumentary system by progressing the wound(s) through the phases of wound healing (inflammation - proliferation - remodeling) by: Wound Therapy Goals - Improve the function of patient's integumentary system by progressing the wound(s) through the phases of wound healing by: Decrease Necrotic Tissue to: 20% Decrease Necrotic Tissue - Progress: Progressing toward goal Increase Granulation Tissue to: 80% Increase Granulation Tissue - Progress: Progressing toward goal Goals/treatment plan/discharge plan were made with and agreed upon by patient/family: Yes Time For Goal Achievement: 7 days Wound Therapy - Potential for Goals: Good  Goals will  be updated until maximal potential achieved or discharge criteria met.  Discharge criteria: when goals achieved, discharge from hospital, MD decision/surgical intervention, no progress towards goals, refusal/missing three consecutive treatments without  notification or medical reason.  GP     Charges PT Wound Care Charges $Wound Debridement up to 20 cm: < or equal to 20 cm $ Wound Debridement each add'l 20 sqcm: 1 $PT Hydrotherapy Dressing: 2 dressings $PT PLS Gun and Tip: 1 Supply $PT Hydrotherapy Visit: 1 Visit       [name] 08/21/2021, 1:51 PM  Rolinda Roan, PT, DPT Acute Rehabilitation Services Pager: [phone] Office: [phone]

## 2021-08-21 NOTE — Progress Notes (Signed)
Pharmacy Antibiotic Note  [name] is a 55 y.o. male admitted on 07/21/2021 with GBS bacteremia related to L-DFI now s/p trans-tib amputation with progressive discitis/osteo on Vancomycin per patient preference.   Cefepime started 12/8 for worsening sacral pressure wound. Completed 7 days of cefepime as of 08/21/21>  cefepime discontinued 12/15 per ID.  SCr stable 1.24 Vancomycin peak = 26 mcg/ml Vancomycin trough = 18 mcg/ml on vancomycin 750 mg q8 hr.  Calculated AUC = 570, is above goal AUC 400-550.  Will adjust vancomycin dose.   Total duration need 8 weeks of IV vanc per Dr. Waldron Labs as discussed with  ID PICC placed 12/7  Disp: SNF    Plan: -Adjust Vancomycin to 1000mg  q12h (estAUC 506, Cmax 30.6, Cmin 13.5)  Plan to continue Vancomcyin x 8 weeks per ID recommendation. - Cefepime discontinued per MD, completed 7 days. - Will continue to follow renal function, culture results, LOT.  Monitor vancomycin trough weekly.  Height: 6\' 3"  (190.5 cm) Weight: 107 kg (235 lb 14.3 oz) IBW/kg (Calculated) : 84.5  Temp (24hrs), Avg:97.9 F (36.6 C), Min:97.6 F (36.4 C), Max:98.2 F (36.8 C)  Recent Labs  Lab 08/15/21 0821 08/15/21 1251 08/17/21 0700 08/20/21 0330 08/21/21 0415 08/21/21 0905 08/21/21 1358  WBC  --   --  6.6 7.3  --   --   --   CREATININE 1.14  --  1.20 1.33*  --  1.24  --   VANCOTROUGH  --    < >  --   --  22*  --  18  VANCOPEAK  --   --   --   --   --  26*  --    < > = values in this interval not displayed.     Estimated Creatinine Clearance: 89 mL/min (by C-G formula based on SCr of 1.24 mg/dL).    Allergies  Allergen Reactions   Bactrim [Sulfamethoxazole-Trimethoprim]    Ceprotin [Protein C Concentrate (Human)]    Ciprofloxacin Other (See Comments)    Kidney function   Levaquin [Levofloxacin]    11/14 Ceftriaxone x1 11/14 Vancomycin >> 11/15, 12/1>>  (plan x 8 weeks) 12/1 Cefepime>>12/5  ; 12/8 > 12/15 11/15 Zosyn >> 11/16 11/15 Clindamycin  >>11/15 11/15 Linezolid>> 11/16 11/16 Rocephin>>11/18 11/18 Penicillin G>>12/1 by ID (2 weeks IV therapy, then 4-6 weeks amox until 09/14/21) 12/2 acyclovir for 7 days > 12/9  Vanc levels and antibiotic dose adjustments: 12/3 VP = 24; VT = 12 - AUC = 484 on 750mg  q8 12/9 VT 14>>continue 750 q8hr 12/15 VT = 22 @ 6553 (early) ? If accurate. Will redraw 12/15 VP = 26 @ 0905  (2 hr post end of  infus/dose given 0559 750mg  q8h) Scr 1.24 12/15 VT = 18 @ 1358 (calc AUC 570,, SCr 1.24) > adjusted dose to 1000 mg q12h , estAUC 506, Cmax 30.6, Cmin 13.5)   11/17 BCx: ngF 11/16 MRS PCR + 11/14 BCx: GBS pan S 11/15 UCx: GBS   Thank you for allowing pharmacy to be a part of this patients care.  Nicole Cella, RPh Clinical Pharmacist [phone]  08/21/2021 3:48 PM  [facility] pharmacy phone numbers are listed on [email or web address]

## 2021-08-21 NOTE — TOC Progression Note (Addendum)
Transition of Care [facility]) - Progression Note    Patient Details  Name: Tymon Nemetz MRN: [id number] Date of Birth: 08-23-1966  [facility]) CM/SW Contact  Zenon Mayo, RN Phone Number: 08/21/2021, 12:49 PM  Clinical Narrative:    Per Anderson Malta with [employer] a peer to peer has been offered for patient for ltach.  CM to call [phone] ext [phone] to  provide with MD name, phone number and best time to call.  [facility] Director will call the MD back.  NCM left message with Dr Waldron Labs phone number for Medical Director to call back.    Expected Discharge Plan: [place] Barriers to Discharge: Continued Medical Work up  Expected Discharge Plan and Services Expected Discharge Plan: [place] In-house Referral: Clinical Social Work Discharge Planning Services: CM Consult, Follow-up appt scheduled Post Acute Care Choice: [place] arrangements for the past 2 months: Apartment                                       Social Determinants of Health (SDOH) Interventions    Readmission Risk Interventions No flowsheet data found.

## 2021-08-21 NOTE — Progress Notes (Addendum)
PROGRESS NOTE  [name]  SJG:[id number] DOB: June 17, 1966 DOA: 07/21/2021 PCP: Patient, No Pcp Per (Inactive)   Brief Narrative:  55 year old male with history of diabetes mellitus type 2, diabetic foot infection with prior right BKA, chronic diastolic CHF, hypertension, dyslipidemia presented with worsening left foot swelling/discoloration and discharge.  On presentation, he was hypotensive, tachycardic with A. fib with RVR with CT suggestive of first MTP osteomyelitis and soft tissue gas concerning for possible necrotizing fasciitis along with AKI with creatinine of 2.7 and mild DKA.  His blood pressures did not improve with IV fluids and he was transferred to ICU and PCCM was consulted.  Orthopedics was consulted.  He underwent left transtibial amputation on 07/23/2021.  He was found to have strep agalactiae bacteremia.  ID was consulted.  He was found to have vertebral discitis versus osteomyelitis on MRI.  He was also found to have new systolic dysfunction with LVEF of 35% along with new onset atrial flutter requiring IV amiodarone which has been switched to oral.  Cardiology was consulted.  He has been started on Eliquis.  S/p TEE followed by DCCV on 11/22.  Developed worsening back pain 11/26-11/27, ID reconsulted, requested MRI T/L-spine, ultimately performed 12/1, with contrast which showed Significant progression of discitis and osteomyelitis at T5 through T8. There is edema extending into the posterior elements. Extensive posterior epidural abscess has progressed since the prior study. Abscess extends from T3 through T9 and is exerting mass-effect on the cord with moderate spinal stenosis T4 through T8.   Due to report of pleural effusions on MRI, thoracentesis was attempted but there was insufficient fluid volume.   Neurosurgery, Dr. Ronnald Ramp, has evaluated the patient recommends no surgery. ID has changed antibiotics to vancomycin with plans for protracted IV abx course thru PICC. He  continues to have PT regularly, getting hydrotherapy for sacral wound which appeared to have infection for which cefepime was added.   Assessment & Plan: Principal Problem:   Discitis of thoracic region Active Problems:   Essential hypertension   Stage 3a chronic kidney disease (HCC)   Type 2 diabetes mellitus with diabetic polyneuropathy, with long-term current use of insulin (HCC)   Right below-knee amputee (HCC)   Diabetic infection of left foot (HCC)   Chronic diastolic (congestive) heart failure (HCC)   Severe sepsis with acute organ dysfunction (HCC)   Chest pain   AKI (acute kidney injury) ([place])   Gas gangrene of foot (HCC)   Atrial flutter (HCC)   Septic shock (HCC)   Systolic dysfunction   Pressure injury of skin   MSSA bacteremia  Septic shock:  -Evolving on admission from bacteremia/diabetic foot infection  - Off pressors.  Shock resolved.   Diabetic left foot infection:  - Status post left transtibial amputation on 07/23/2021. - Wound/wound VAC DC'ed per Dr. Sharol Given, recommends dry dressing which has been applied.  Patient with stables, Dr. Sharol Given to evaluate today when appropriate to remove.   Group B streptococcus bacteremia, progressive thoracic discitis/osteomyelitis.  - No vegetation on TTE or TEE during cardioversion.  MRI repeated with contrast on 12/1 showed Significant progression of discitis and osteomyelitis at T5 through T8 and extensive posterior epidural abscess has progressed since the prior study extending T3 through T9 exerting mass-effect on the cord with moderate spinal stenosis T4 through T8. - Neurosurgery consulted, continues to recommend IV antibiotics and advise against surgery in this difficult situation. Fortunately, there is clinical improvement with this strategy.  Did discuss with Dr. Ronnald Ramp, will repeat  MRI thoracic spine today to evaluate for progression.   - Continue IV abx thru PICC (placed 12/7). Total duration TBD(but will need at least  total of 8 weeks of IV vancomycin as D/W ID) cleared blood cultures 07/24/2021.   Sacral pressure wound:  -Infection noted 12/8. D/w ID, culture is unlikely to be helpful here. Not becoming septic, WBC and temperature remain wnl.  - Started cefepime to include pseudomonas coverage, plan 7 days. - Continue offloading/turning, and getting OOB. - Continue topical nystatin -Continue with hydrotherapy  New onset systolic CHF: Echo showed EF of 35% (previous echo showed EF of 50 to 55% in 04/2021). Cardiomyopathy may be from new onset atrial flutter RVR related tachycardia versus stress induced cardiomyopathy from bacteremia and septic shock. - Cardiology no longer following, recommended torsemide 53m QOD for now. - Continue metop succinate, bidil. CrCl limited ACE/ARB/ARNI and MRA. - HF navigator consulted.  New onset atrial flutter: Maintained NSR since DCCV 11/22 - Continue metoprolol succinate 59mdaily. - Continue amiodarone 20076maily - Restarted eliquis since no procedural plans, preferably uninterrupted anticoagulation for 1 month post DCCV.  Acute urinary retention:  - Pt reports retention-type symptoms chronically and has repeatedly required I&O caths which are met with significant resistance. Strong suspicion for BPH. Pt also reports hx penis fracture not repaired surgically years ago. -Started on finasteride, tamsulosin, Foley catheter inserted 12/13, he will need voiding trial in few days , will need to follow with urology as an outpatient regardless.    T2DM uncontrolled with hyperglycemia:  - Continue basal-bolus insulin.  CBG remains elevated, will increase Premeal male NovoLog to 4 units, and Semglee from 33 > 36 units twice daily   AKI on stage IIIa CKD: AKI resolving with limitation on diuresis. - Monitor intermittently while on vancomycin. CrCl now improved.   Pt takes testosterone/anabolic steroids (not prescription):  - Free level checked 11/29, low at 4.0. Rechecked  12/5, normal at 8.8. Albumin is 1.6 making total level falsely low.   Hyponatremia: Improved.   Hypomagnesemia: Improved with supplementation.  - Follow periodically.   Leukocytosis: Resolved.   Thrombocytopenia: Resolved  History of incomplete bladder emptying:  - Check postvoid residual - Follow up with urology as outpatient (vs. inpatient if retaining)   Generalized deconditioning - PT recommends CIR vs. SNF. Pt making gains and working with PT. We discussed with CIR who will follow the patient for admission once he meets criteria. Unable to DC to SNF while undergoing hydrotherapy.   Acute on chronic pain: Follows with Dr. RamNelva Bushain management.  Has received steroid injections to the back which, we've confirmed, will be delayed until completion of IV antibiotics. - Reviewed [place] PDMP and patient regularly fills opioid prescriptions. Likely opioid-dependent. - Obviously has reasons for acute pain from recent surgery, discitis/osteomyelitis. Opioid tolerance PTA and may be increasing here. Bowel regimen also ordered. - Continue multimodal pain control as ordered: - Tylenol 1 g 3 times daily. - Oxy IR 10 mg every 4 hourly as needed for moderate pain - OxyContin 15 Mg every 12 hours - Lidocaine patch increased to 2 patches - Dilaudid 0.5 mg IV every 4 hourly as needed severe pain. Discussed need to limit/discontinue this, though prior to hydrotherapy will remain available. - Gabapentin 300 mg 3 times daily   Insomnia - Continue trazodone 100m5mS  Liver nodularity: Noted incidentally on CT chest 04/09/2021. Hypoalbuminemia noted. No ascites or LE edema, or signs of hepatic encephalopathy. Ferritin wnl. - INR was elevated to  1.4 when checked 11/14.   Pulmonary nodules: Unchanged on last CT chest 51m and smalled definitively benign, no further evaluation recommended.  Reported renal lesion: Per pt. Follows with Dr. HLouis Meckel  - Follow up with Dr. HLouis Meckelrecommended.   Pleural  effusion: Chronic, multifactorial partially related to CHF and to hypoalbuminemia. Not sufficient for thoracentesis.   RN Pressure Injury Documentation: Pressure Injury 07/27/21 Sacrum Mid Unstageable - Full thickness tissue loss in which the base of the injury is covered by slough (yellow, tan, gray, green or brown) and/or eschar (tan, brown or black) in the wound bed. small pink pressure ulcer with bre (Active)  07/27/21 0255  Location: Sacrum  Location Orientation: Mid  Staging: Unstageable - Full thickness tissue loss in which the base of the injury is covered by slough (yellow, tan, gray, green or brown) and/or eschar (tan, brown or black) in the wound bed.  Wound Description (Comments): small pink pressure ulcer with break in skin 08/12/21; updated to unstageable pressure injury  Present on Admission: Yes   DVT prophylaxis: Eliquis Code Status: Full Family Communication: None at bedside. Disposition Plan:  Status is: Inpatient  Remains inpatient appropriate because: Unsafe DC. Needs intensive PT and hydrotherapy, though SNF cannot take him while pursuing hydrotherapy.  Consultants:  ID Neurosurgery PCCM Cardiology  Procedures:  Left TTA 07/23/2021 TEE/DCCV 07/29/2021 PICC RUE 08/13/2021  Antimicrobials: Cefazolin 11/16 Ceftriaxone 11/14, 11/16-17 Clindamycin 11/15-16 Linezolid 11/15 Pen G 11/18 Pip-tazo 11/15-11/16 Vancomycin 11/14 Cefepime 12/8 - 12/12  Subjective:  Patient report back pain has improved after adjusting the air mattress setting, reports he worked well with PT yesterday.      Objective: Vitals:   08/20/21 1944 08/20/21 2348 08/21/21 0358 08/21/21 0730  BP: 101/61 116/60 121/76 131/85  Pulse: 66   (!) 50  Resp: '16 16 18 13  ' Temp: 98.2 F (36.8 C) 98 F (36.7 C) 97.8 F (36.6 C) 97.9 F (36.6 C)  TempSrc: Oral Oral Oral Oral  SpO2: 98% 95% 93% 98%  Weight:      Height:        Intake/Output Summary (Last 24 hours) at 08/21/2021  1008 Last data filed at 08/21/2021 0916 Gross per 24 hour  Intake 380 ml  Output 2400 ml  Net -2020 ml   Filed Weights   07/27/21 0252 07/29/21 0754 08/01/21 0459  Weight: 108.2 kg 108.2 kg 107 kg      Awake Alert, Oriented X 3, No new F.N deficits, Normal affect Symmetrical Chest wall movement, Good air movement bilaterally, CTAB RRR,No Gallops,Rubs or new Murmurs, No Parasternal Heave +ve B.Sounds, Abd Soft, No tenderness, No rebound - guarding or rigidity. No Cyanosis, Clubbing or edema, extremity BKA, left BKA staples present, site looks clean  Data Reviewed: I have personally reviewed following labs and imaging studies  CBC: Recent Labs  Lab 08/17/21 0700 08/20/21 0330  WBC 6.6 7.3  HGB 13.1 12.8*  HCT 40.9 39.8  MCV 91.5 90.7  PLT 304 2737  Basic Metabolic Panel: Recent Labs  Lab 08/15/21 0821 08/17/21 0700 08/20/21 0330 08/21/21 0905  NA 133* 133* 131* 136  K 4.0 4.3 3.8 4.1  CL 100 101 99 104  CO2 '25 25 26 27  ' GLUCOSE 268* 268* 206* 242*  BUN 23* 23* 27* 24*  CREATININE 1.14 1.20 1.33* 1.24  CALCIUM 8.3* 8.4* 8.2* 8.5*  MG 1.5*  --   --   --    GFR: Estimated Creatinine Clearance: 89 mL/min (by C-G formula based  on SCr of 1.24 mg/dL). Liver Function Tests: Recent Labs  Lab 08/17/21 0700 08/20/21 0330  AST 21 20  ALT 39 33  ALKPHOS 102 108  BILITOT 0.5 0.4  PROT 5.8* 5.8*  ALBUMIN 1.8* 1.9*   No results for input(s): LIPASE, AMYLASE in the last 168 hours. No results for input(s): AMMONIA in the last 168 hours. Coagulation Profile: No results for input(s): INR, PROTIME in the last 168 hours. Cardiac Enzymes: No results for input(s): CKTOTAL, CKMB, CKMBINDEX, TROPONINI in the last 168 hours. BNP (last 3 results) No results for input(s): PROBNP in the last 8760 hours. HbA1C: No results for input(s): HGBA1C in the last 72 hours. CBG: Recent Labs  Lab 08/20/21 0615 08/20/21 1115 08/20/21 1645 08/20/21 2133 08/21/21 0626  GLUCAP 153*  242* 265* 210* 223*   Lipid Profile: No results for input(s): CHOL, HDL, LDLCALC, TRIG, CHOLHDL, LDLDIRECT in the last 72 hours. Thyroid Function Tests: No results for input(s): TSH, T4TOTAL, FREET4, T3FREE, THYROIDAB in the last 72 hours. Anemia Panel: No results for input(s): VITAMINB12, FOLATE, FERRITIN, TIBC, IRON, RETICCTPCT in the last 72 hours.  Urine analysis:    Component Value Date/Time   COLORURINE YELLOW 07/22/2021 0250   APPEARANCEUR CLOUDY (A) 07/22/2021 0250   LABSPEC 1.018 07/22/2021 0250   PHURINE 5.0 07/22/2021 0250   GLUCOSEU >=500 (A) 07/22/2021 0250   HGBUR SMALL (A) 07/22/2021 0250   BILIRUBINUR NEGATIVE 07/22/2021 0250   KETONESUR 5 (A) 07/22/2021 0250   PROTEINUR 30 (A) 07/22/2021 0250   UROBILINOGEN 0.2 09/13/2019 0906   NITRITE NEGATIVE 07/22/2021 0250   LEUKOCYTESUR TRACE (A) 07/22/2021 0250   No results found for this or any previous visit (from the past 240 hour(s)).    Radiology Studies: No results found.  Scheduled Meds:  acetaminophen  1,000 mg Oral TID   amiodarone  200 mg Oral Daily   apixaban  5 mg Oral BID   vitamin C  1,000 mg Oral Daily   Chlorhexidine Gluconate Cloth  6 each Topical Daily   collagenase   Topical Daily   docusate sodium  100 mg Oral Daily   finasteride  5 mg Oral Daily   gabapentin  300 mg Oral TID   insulin aspart  0-15 Units Subcutaneous TID WC   insulin aspart  0-5 Units Subcutaneous QHS   insulin aspart  3 Units Subcutaneous TID WC   insulin detemir  33 Units Subcutaneous BID   isosorbide mononitrate  30 mg Oral Daily   lidocaine  2 patch Transdermal Q24H   methocarbamol  1,000 mg Oral Q8H   metoprolol succinate  50 mg Oral Daily   multivitamin with minerals  1 tablet Oral Daily   nutrition supplement (JUVEN)  1 packet Oral BID BM   nystatin cream   Topical BID   oxyCODONE  15 mg Oral Q12H   polyethylene glycol  17 g Oral Daily   Ensure Max Protein  11 oz Oral BID BM   sodium chloride flush  10-40 mL  Intracatheter Q12H   tamsulosin  0.4 mg Oral Daily   torsemide  20 mg Oral QODAY   Continuous Infusions:  sodium chloride Stopped (08/13/21 0457)   vancomycin Stopped (08/21/21 1400)     LOS: 30 days    Phillips Climes, MD [facility] [email or web address] 08/21/2021, 10:08 AM

## 2021-08-21 NOTE — Progress Notes (Signed)
Called lab for Vancomycin trough level which was 22.2. I called pharmacy and and reported it to [place] he said to give the scheduled next dose for 0600.

## 2021-08-21 NOTE — Progress Notes (Signed)
11:15: Handoff given to Allayne Gitelman, RN

## 2021-08-22 ENCOUNTER — Inpatient Hospital Stay (HOSPITAL_COMMUNITY): Payer: Medicare HMO

## 2021-08-22 DIAGNOSIS — L89150 Pressure ulcer of sacral region, unstageable: Secondary | ICD-10-CM

## 2021-08-22 LAB — GLUCOSE, CAPILLARY
Glucose-Capillary: 150 mg/dL — ABNORMAL HIGH (ref 70–99)
Glucose-Capillary: 236 mg/dL — ABNORMAL HIGH (ref 70–99)
Glucose-Capillary: 152 mg/dL — ABNORMAL HIGH (ref 70–99)
Glucose-Capillary: 229 mg/dL — ABNORMAL HIGH (ref 70–99)

## 2021-08-22 IMAGING — MR MR THORACIC SPINE WO/W CM
4 of 9 series · 18 of 48 positions shown · IV contrast (gadavist)
Comparison: MRI thoracic spine [DATE]

CLINICAL DATA: Follow-up discitis and osteomyelitis and epidural
abscess.

EXAM:
MRI THORACIC WITHOUT AND WITH CONTRAST
TECHNIQUE: Multiplanar and multiecho pulse sequences of the thoracic spine were
obtained without and with intravenous contrast.
CONTRAST:  10mL GADAVIST GADOBUTROL 1 MMOL/ML IV SOLN

[Series 4: T2 · sagittal · 3.0mm · 0.62mm/px · 2 of 14 slices shown (1 of 2)]
[im 1/14]
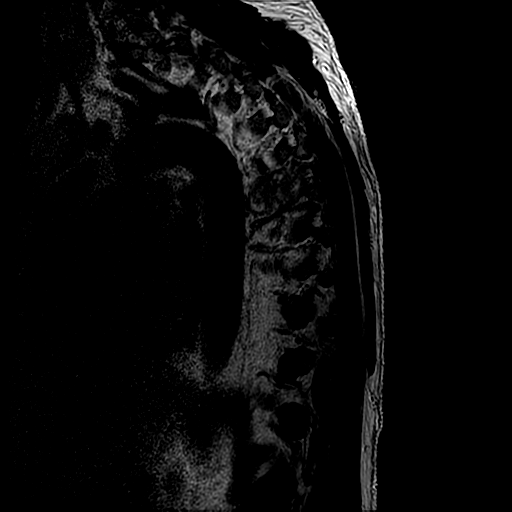
[im 14/14]
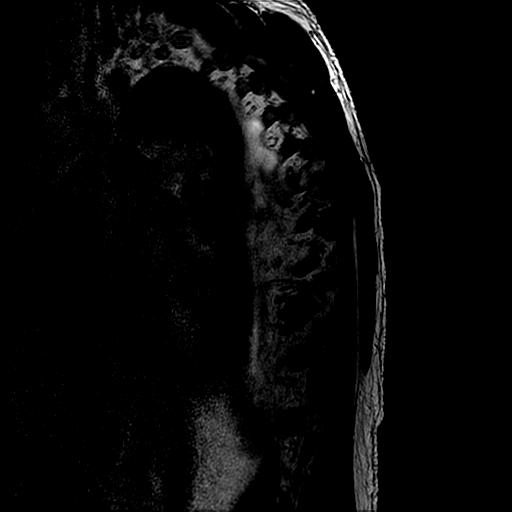

[Series 5: T1 · sagittal · 3.0mm · 0.62mm/px · 2 of 14 slices shown (1 of 2)]
[im 1/14]
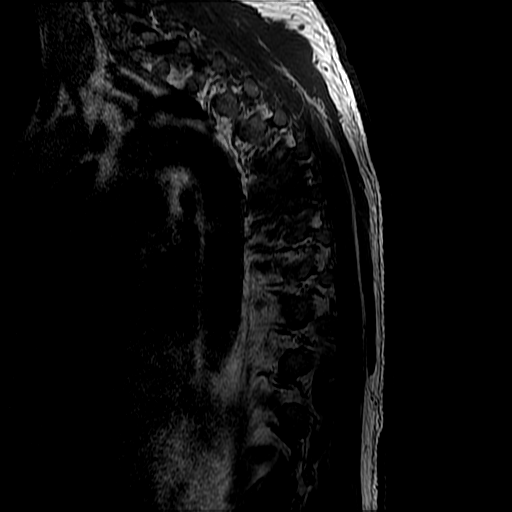
[im 14/14]
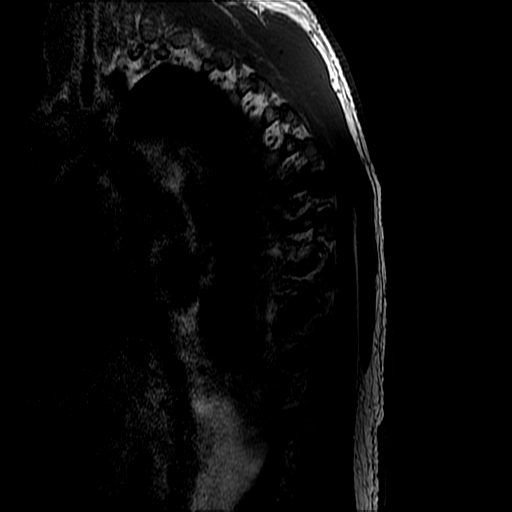

[Series 8: T2 · axial · 4.0mm · 0.45mm/px · z∈[-210,+125]mm · 9 of 45 slices shown (2 of 2)]
[im 1/45]
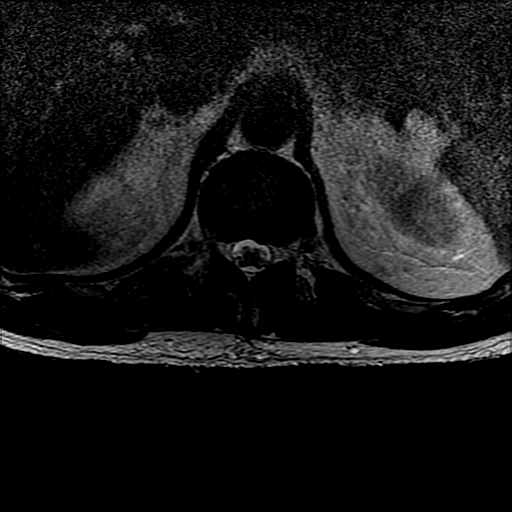
[im 6/45]
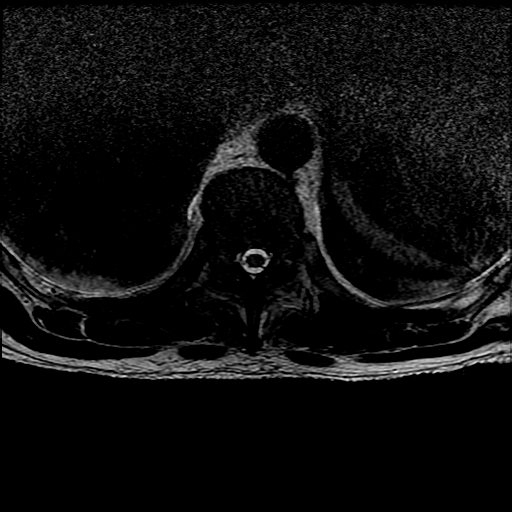
[im 12/45]
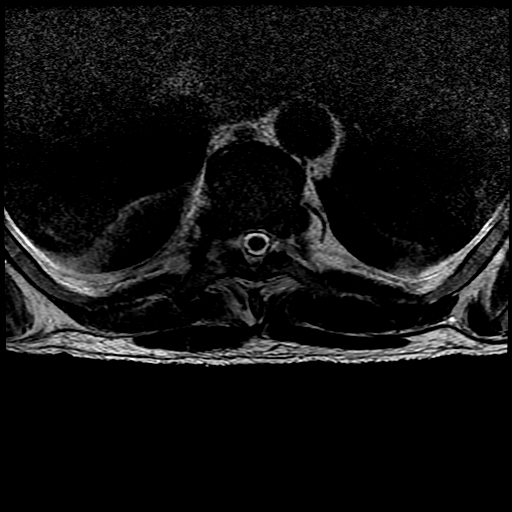
[im 17/45]
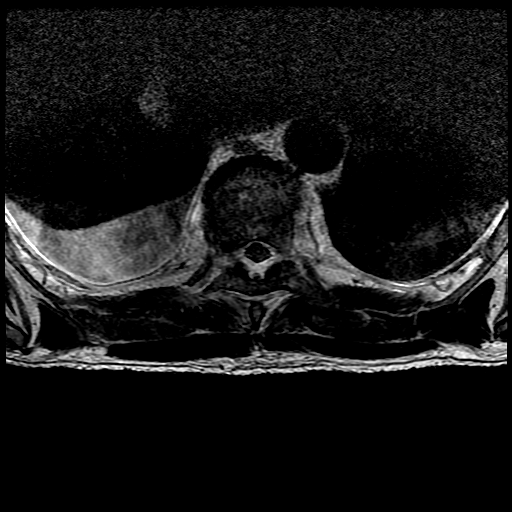
[im 23/45]
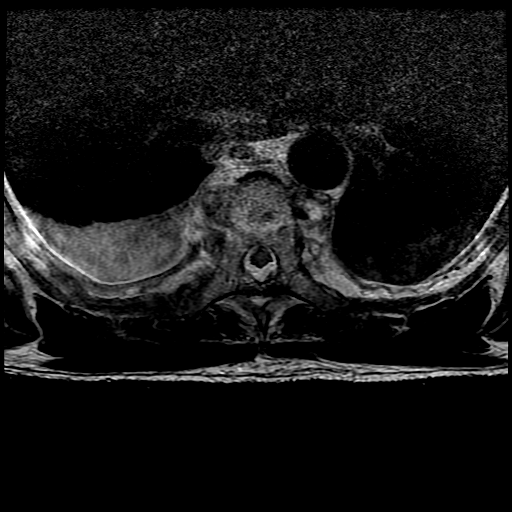
[im 28/45]
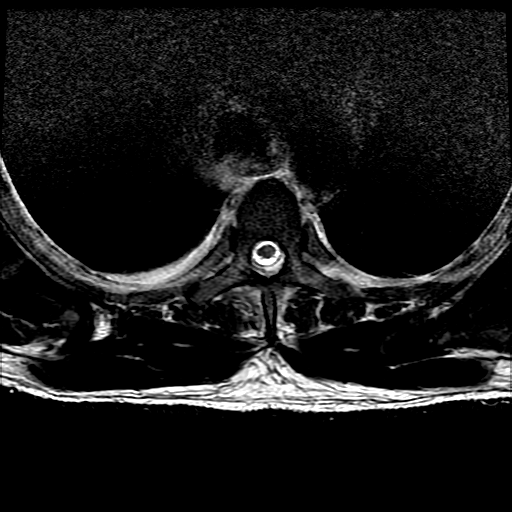
[im 34/45]
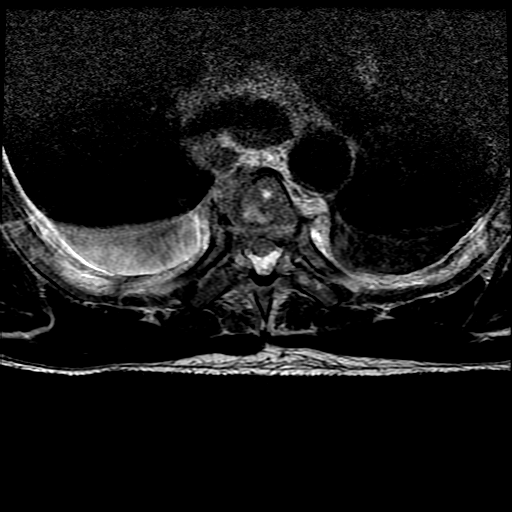
[im 39/45]
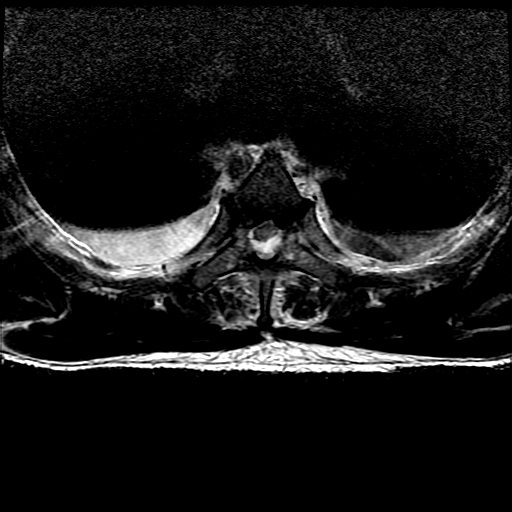
[im 45/45]
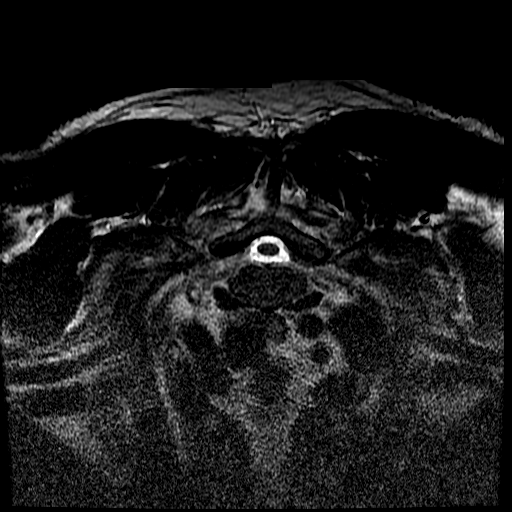

[Series 9: T1 · axial · non-contrast · 4.0mm · 0.43mm/px · z∈[-210,-28]mm · 5 of 45 slices shown (2 of 2)]
[im 1/45]
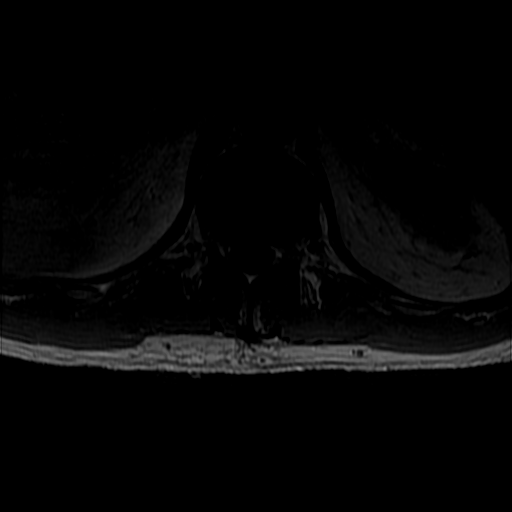
[im 6/45]
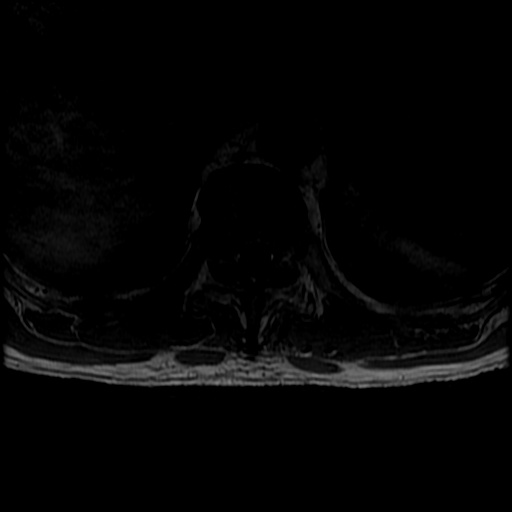
[im 12/45]
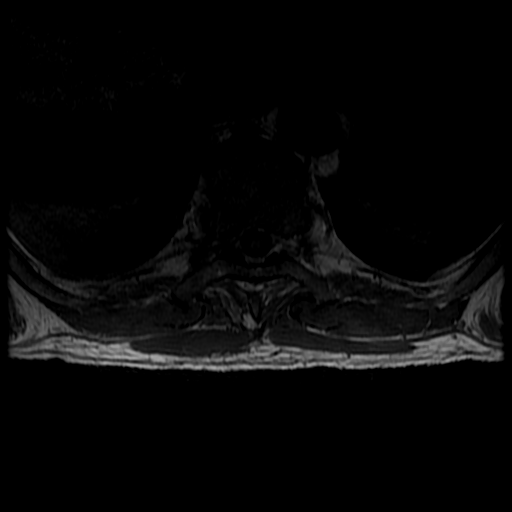
[im 23/45]
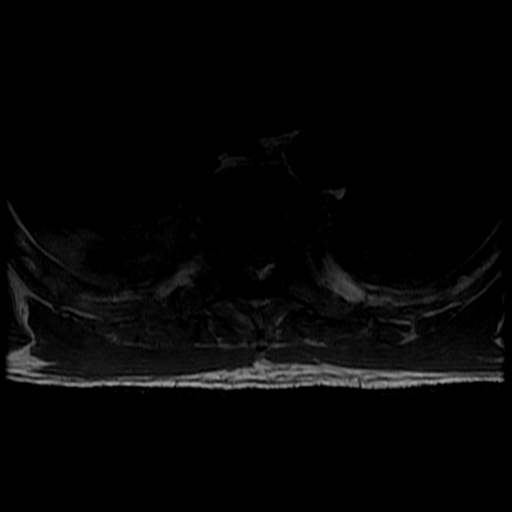
[im 39/45]
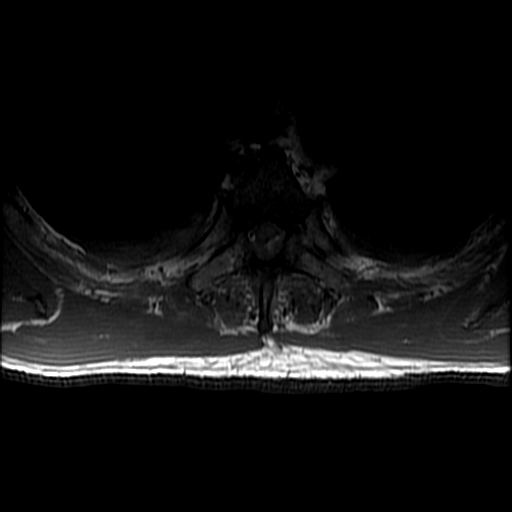

[18 of 48 positions shown; findings below may reference images not displayed]

FINDINGS: Alignment:  Normal

Vertebrae: Discitis and osteomyelitis at T6-7 appears improved.
There is decreased pus within the disc space. There is endplate
erosion at T6-7. There remains significant surrounding bone marrow
edema and enhancement extending from T5 through T8. There is edema
extending into the pedicle and posterior elements of T5 and T6 as
noted previously. No new level of infection in the thoracic spine

Cord: Cord evaluation limited by motion. Posterior epidural abscess
has improved in the interval. The abscess is located in the
posterior epidural space and appears smaller. There remains moderate
spinal stenosis at T6-7 due to posterior epidural abscess as well as
ventral epidural thickening. The cord is flattened due to spinal
stenosis, similar to the prior study. Previously noted abscess at
T3-4 on the right has significantly improved.

Paraspinal and other soft tissues: Small bilateral layering pleural
effusions have improved in the interval. There is paraspinous soft
tissue thickening T5 through T9 compatible with acute infection.

Disc levels:

Discitis and osteomyelitis at T6-7 as described above. Bone marrow
edema and likely infection extends from T5-T8.

Mild disc degeneration at other levels in the thoracic spine without
significant spinal stenosis or interval change.
IMPRESSION: 1. Interval improvement in discitis and osteomyelitis centered at
T6-7. Bone marrow edema and enhancement extends into T5 and T8.
Posterior epidural abscess centered at T6-7 has improved. There
remains moderate spinal stenosis with deformity of the cord due to
stenosis from the abscess. This is similar in degree to the prior
study.
2. No interval fracture. No new area of infection in the thoracic
spine.
3. Small bilateral pleural effusions have improved in the interval.

## 2021-08-22 MED ORDER — HYDROMORPHONE HCL 1 MG/ML IJ SOLN
1.0000 mg | Freq: Once | INTRAMUSCULAR | Status: AC
  Administered 2021-08-22: 1 mg via INTRAVENOUS
  Filled 2021-08-22: qty 1

## 2021-08-22 MED ORDER — GADOBUTROL 1 MMOL/ML IV SOLN
10.0000 mL | Freq: Once | INTRAVENOUS | Status: AC | PRN
  Administered 2021-08-22: 10 mL via INTRAVENOUS

## 2021-08-22 MED ORDER — HYDROMORPHONE HCL 1 MG/ML IJ SOLN: 1.0000 mg | Freq: Once | INTRAMUSCULAR | Status: DC | PRN

## 2021-08-22 NOTE — TOC Progression Note (Signed)
Transition of Care [facility]) - Progression Note    Patient Details  Name: [name] MRN: [id number] Date of Birth: 1966/05/24  Transition of Care [facility]) CM/SW Contact  Zenon Mayo, RN Phone Number: 08/22/2021, 12:23 PM  Clinical Narrative:    Per MD the peer to peer was denied because the Medical Director called [facility] and asked if they did hydrotherapy and they said no.  NCM informed Anderson Malta of this information, she states she does not know who the Medical Director spoke to but they do hydrotherapy there it is called something different, she will work on , to see if she can get this appealed.  NCM waiting to hear back on appeal.    Expected Discharge Plan: [place] Barriers to Discharge: Continued Medical Work up  Expected Discharge Plan and Services Expected Discharge Plan: [place] In-house Referral: Clinical Social Work Discharge Planning Services: CM Consult, Follow-up appt scheduled Post Acute Care Choice: [place] arrangements for the past 2 months: Apartment                                       Social Determinants of Health (SDOH) Interventions    Readmission Risk Interventions No flowsheet data found.

## 2021-08-22 NOTE — Progress Notes (Signed)
MRI reviewed.  There is improvement in the osteomyelitis and discitis and in the epidural abscess.  There is some canal narrowing but no cord compression and no signal change in the cord.  He remains clinically stable and is now on an oral anticoagulant.  He is awaiting discharge.  We will sign off but please call us for any concerns or questions.  He will follow-up with me about 3 weeks after discharge please. continue antibiotics per infectious disease

## 2021-08-22 NOTE — Progress Notes (Signed)
Occupational Therapy Treatment Patient Details Name: [name] MRN: [id number] DOB: October 26, 1965 Today's Date: 08/22/2021   History of present illness Pt is a 55 y.o. 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 S/p TEE, DCCV on 11/22. Chest CT 12/4 with pleural effusion; thoracentesis attempted with insufficient fluid volume. PMH includes R BKA, aflutter, DM, HTN, depression, anxiety.   OT comments  COTA assisted PT and OT with transfer on earlier visit to recliner and return one hour later to assist back to bed. Patient complained that his wound site was too painful to tolerate sitting up long but no complaints of back pain from sitting. Patient was max assist +3 to return to EOB and mod assist of 2 to return to supine. Patient making gains progressing with transfers. Acute OT to continue to follow.    Recommendations for follow up therapy are one component of a multi-disciplinary discharge planning process, led by the attending physician.  Recommendations may be updated based on patient status, additional functional criteria and insurance authorization.    Follow Up Recommendations  Skilled nursing-short term rehab (<3 hours/day)    Assistance Recommended at Discharge Frequent or constant Supervision/Assistance  Equipment Recommendations  None recommended by OT    Recommendations for Other Services      Precautions / Restrictions Precautions Precautions: Fall;Back Precaution Booklet Issued: No Precaution Comments: Back precautions for comfort Required Braces or Orthoses: Other Brace Other Brace: LLE limb guard; has RLE prosthetic in room (not fitting currently per pt?) Restrictions Weight Bearing Restrictions: Yes LLE Weight Bearing: Non weight bearing Other  Position/Activity Restrictions: RLE prosthetic is not fitting currently per pt       Mobility Bed Mobility Overal bed mobility: Needs Assistance Bed Mobility: Sit to Supine     Supine to sit: Mod assist;+2 for physical assistance     General bed mobility comments: patient using rail and trapeze to assist with bed mobility    Transfers Overall transfer level: Needs assistance Equipment used: None Transfers: Bed to chair/wheelchair/BSC            Lateral/Scoot Transfers: Max assist;+2 physical assistance;+2 safety/equipment;From elevated surface (+3 assist) General transfer comment: Patient tolerated 1 hour of sitting up in recliner and required max assist +3 to return to EOB to reduce sheer force on buttocks with pads used to assist     Balance Overall balance assessment: Needs assistance Sitting-balance support: Single extremity supported;Bilateral upper extremity supported Sitting balance-Leahy Scale: Poor Sitting balance - Comments: min to mod assist to sit EOB with one to two extremties assisting Postural control: Posterior lean;Right lateral lean     Standing balance comment: Unable                           ADL either performed or assessed with clinical judgement   ADL Overall ADL's : Needs assistance/impaired             Lower Body Bathing: Moderate assistance;Bed level   Upper Body Dressing : Minimal assistance;Bed level   Lower Body Dressing: Maximal assistance;Bed level   Toilet Transfer: Maximal assistance;+2 for safety/equipment;Anterior/posterior (lateral scoot transfer)           Functional mobility during ADLs: Maximal assistance;+2 for physical assistance;+2 for safety/equipment      Extremity/Trunk Assessment Upper  Extremity Assessment Upper Extremity Assessment: Generalized weakness            Vision       Perception     Praxis      Cognition Arousal/Alertness: Awake/alert Behavior During Therapy: WFL for  tasks assessed/performed Overall Cognitive Status: No family/caregiver present to determine baseline cognitive functioning Area of Impairment: Attention;Safety/judgement;Awareness;Problem solving                   Current Attention Level: [facility]     Safety/Judgement: Decreased awareness of deficits Awareness: Emergent Problem Solving: Requires verbal cues;Difficulty sequencing;Requires tactile cues General Comments: patient motivated towards getting out of bed but difficulty with sitting tolerance due to wound          Exercises     Shoulder Instructions       General Comments Educated pt on need to activate core muscles and to perform modified sit ups in chair while up, but pt continually redirecting conversation to stretching    Pertinent Vitals/ Pain       Pain Assessment: Faces Faces Pain Scale: Hurts even more Pain Location: back and wound site Pain Descriptors / Indicators: Grimacing;Guarding;Moaning Pain Intervention(s): Monitored during session;Premedicated before session;Repositioned  Home Living                                          Prior Functioning/Environment              Frequency  Min 3X/week        Progress Toward Goals  OT Goals(current goals can now be found in the care plan section)  Progress towards OT goals: Progressing toward goals  Acute Rehab OT Goals Patient Stated Goal: go to rehab OT Goal Formulation: With patient Time For Goal Achievement: 09/05/21 Potential to Achieve Goals: Good ADL Goals Pt Will Perform Grooming: sitting;with supervision Pt Will Perform Upper Body Bathing: with supervision;sitting Pt Will Perform Lower Body Dressing: with min assist;bed level;sitting/lateral leans Pt Will Transfer to Toilet: with mod assist;bedside commode;anterior/posterior transfer;with transfer board Pt/caregiver will Perform Home Exercise Program: Increased ROM;Left upper extremity;With written HEP  provided Additional ADL Goal #1: Pt will complete bed mobility with mod assist as precursor to ADLs.  Plan Discharge plan remains appropriate    Co-evaluation    PT/OT/SLP Co-Evaluation/Treatment: Yes Reason for Co-Treatment: Complexity of the patient's impairments (multi-system involvement);For patient/therapist safety;To address functional/ADL transfers PT goals addressed during session: Mobility/safety with mobility;Balance;Strengthening/ROM OT goals addressed during session: Strengthening/ROM      AM-PAC OT "6 Clicks" Daily Activity     Outcome Measure   Help from another person eating meals?: A Little Help from another person taking care of personal grooming?: A Little Help from another person toileting, which includes using toliet, bedpan, or urinal?: A Lot Help from another person bathing (including washing, rinsing, drying)?: A Lot Help from another person to put on and taking off regular upper body clothing?: A Little Help from another person to put on and taking off regular lower body clothing?: A Lot 6 Click Score: 15    End of Session    OT Visit Diagnosis: Pain;Muscle weakness (generalized) (M62.81) Pain - Right/Left: Left Pain - part of body: Hip   Activity Tolerance Patient tolerated treatment well   Patient Left in bed;with call bell/phone within reach   Nurse Communication Mobility status        Time:  7366-8159 OT Time Calculation (min): 15 min  Charges: OT General Charges $OT Visit: 1 Visit OT Treatments $Therapeutic Activity: 8-22 mins  [name], OTA Acute Rehabilitation Services  Pager [phone] Office [phone]   Trixie Dredge 08/22/2021, 1:15 PM

## 2021-08-22 NOTE — Progress Notes (Signed)
Occupational Therapy Treatment Patient Details Name: [name] MRN: [id number] DOB: 1965/10/10 Today's Date: 08/22/2021   History of present illness Pt is a 55 y.o. 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 S/p TEE, DCCV on 11/22. Chest CT 12/4 with pleural effusion; thoracentesis attempted with insufficient fluid volume. PMH includes R BKA, aflutter, DM, HTN, depression, anxiety.   OT comments  Pt seen in conjunction with PT.  He demonstrates improved activity participation and tolerance.  He demonstrates the ability to perform LB ADLs with max A bed level and min - mod A for UB ADLs. He was able to transfer to recliner with max A +3 today.  Goals remain appropriate and were updated.    Recommendations for follow up therapy are one component of a multi-disciplinary discharge planning process, led by the attending physician.  Recommendations may be updated based on patient status, additional functional criteria and insurance authorization.    Follow Up Recommendations  Skilled nursing-short term rehab (<3 hours/day)    Assistance Recommended at Discharge Frequent or constant Supervision/Assistance  Equipment Recommendations  None recommended by OT    Recommendations for Other Services      Precautions / Restrictions Precautions Precautions: Fall;Back Precaution Booklet Issued: No Precaution Comments: Back precautions for comfort Required Braces or Orthoses: Other Brace Other Brace: LLE limb guard; has RLE prosthetic in room (not fitting currently per pt?) Restrictions Weight Bearing Restrictions: Yes LLE Weight Bearing: Non weight bearing Other Position/Activity Restrictions: RLE prosthetic is not fitting currently per pt       Mobility Bed  Mobility Overal bed mobility: Needs Assistance Bed Mobility: Supine to Sit     Supine to sit: Mod assist;+2 for physical assistance;HOB elevated;+2 for safety/equipment     General bed mobility comments: Pt using overhead trapeze to initiate trunk ascension and pivot of hips to R to exit R EOB. ModAx2 to complete hip pivot using bed pad and to ascend trunk, better initiation by pt and improved speed but still slow today.    Transfers Overall transfer level: Needs assistance Equipment used: None Transfers: Bed to chair/wheelchair/BSC            Lateral/Scoot Transfers: Max assist;+2 physical assistance;+2 safety/equipment;From elevated surface (+3) General transfer comment: Pt agreeable to lateral scoot towards R from elevated EOB to recliner, cuing pt to scoot on bed x2 reps to align with chair. Pt needing maxAx3 to lift and reduce shear force on buttocks via use of bed pads with lateral transfer to chair.     Balance Overall balance assessment: Needs assistance Sitting-balance support: Single extremity supported;Bilateral upper extremity supported Sitting balance-Leahy Scale: Poor Sitting balance - Comments: Reliant on 1-2 UE support and min-modA to sit EOB Postural control: Posterior lean;Right lateral lean     Standing balance comment: Unable                           ADL either performed or assessed with clinical judgement   ADL Overall ADL's : Needs assistance/impaired             Lower Body Bathing: Moderate assistance;Bed level   Upper Body Dressing : Minimal assistance;Bed level   Lower Body Dressing: Maximal assistance;Bed level   Toilet Transfer: Maximal assistance;+2 for safety/equipment;Anterior/posterior (lateral scoot transfer)  Functional mobility during ADLs: Maximal assistance;+2 for physical assistance;+2 for safety/equipment      Extremity/Trunk Assessment Upper Extremity Assessment Upper Extremity Assessment:  Generalized weakness            Vision       Perception     Praxis      Cognition Arousal/Alertness: Awake/alert Behavior During Therapy: WFL for tasks assessed/performed Overall Cognitive Status: No family/caregiver present to determine baseline cognitive functioning Area of Impairment: Attention;Safety/judgement;Awareness;Problem solving                   Current Attention Level: [facility]     Safety/Judgement: Decreased awareness of deficits Awareness: Emergent Problem Solving: Requires verbal cues;Difficulty sequencing;Requires tactile cues General Comments: Pt particular about what exercises he will and will not do, even though performed several times before, now stating he cannot due to being in bed. Pt very compliant with exercises brought up by his son. Difficult to redirect and educate on need to strengthen core, even though was focused on this multiple session prior, now focusing on stretching. Needs multi-modal cues for sequencing. Likely baseline.          Exercises     Shoulder Instructions       General Comments Educated pt on need to activate core muscles and to perform modified sit ups in chair while up, but pt continually redirecting conversation to stretching    Pertinent Vitals/ Pain       Pain Assessment: Faces Faces Pain Scale: Hurts even more Pain Location: back Pain Descriptors / Indicators: Grimacing;Guarding;Moaning Pain Intervention(s): Monitored during session;Limited activity within patient's tolerance;Premedicated before session  Home Living                                          Prior Functioning/Environment              Frequency  Min 3X/week        Progress Toward Goals  OT Goals(current goals can now be found in the care plan section)  Progress towards OT goals: Progressing toward goals  Acute Rehab OT Goals Patient Stated Goal: to go to rehab OT Goal Formulation: With patient Time For  Goal Achievement: 09/05/21 Potential to Achieve Goals: Good ADL Goals Pt Will Perform Grooming: sitting;with supervision Pt Will Perform Upper Body Bathing: with supervision;sitting Pt Will Perform Lower Body Dressing: with min assist;bed level;sitting/lateral leans Pt Will Transfer to Toilet: with mod assist;bedside commode;anterior/posterior transfer;with transfer board Pt/caregiver will Perform Home Exercise Program: Increased ROM;Left upper extremity;With written HEP provided Additional ADL Goal #1: Pt will complete bed mobility with mod assist as precursor to ADLs.  Plan Discharge plan remains appropriate    Co-evaluation    PT/OT/SLP Co-Evaluation/Treatment: Yes Reason for Co-Treatment: Complexity of the patient's impairments (multi-system involvement);For patient/therapist safety;To address functional/ADL transfers PT goals addressed during session: Mobility/safety with mobility;Balance;Strengthening/ROM OT goals addressed during session: Strengthening/ROM;ADL's and self-care      AM-PAC OT "6 Clicks" Daily Activity     Outcome Measure   Help from another person eating meals?: A Little Help from another person taking care of personal grooming?: A Little Help from another person toileting, which includes using toliet, bedpan, or urinal?: A Lot Help from another person bathing (including washing, rinsing, drying)?: A Lot Help from another person to put on and taking off regular upper body clothing?: A Little Help from another person  to put on and taking off regular lower body clothing?: A Lot 6 Click Score: 15    End of Session    OT Visit Diagnosis: Pain;Muscle weakness (generalized) (M62.81) Pain - Right/Left: Left Pain - part of body: Hip   Activity Tolerance Patient tolerated treatment well   Patient Left in chair;with call bell/phone within reach   Nurse Communication Mobility status        Time: 2820-6015 OT Time Calculation (min): 24 min  Charges: OT  Treatments $Therapeutic Activity: 8-22 mins [name]., OTR/L Acute Rehabilitation Services Pager [phone] Office [phone]   [name] M 08/22/2021, 12:15 PM

## 2021-08-22 NOTE — Progress Notes (Signed)
PROGRESS NOTE  [name]  OEU:[id number] DOB: 02-20-1966 DOA: 07/21/2021 PCP: Patient, No Pcp Per (Inactive)   Brief Narrative:  55 year old male with history of diabetes mellitus type 2, diabetic foot infection with prior right BKA, chronic diastolic CHF, hypertension, dyslipidemia presented with worsening left foot swelling/discoloration and discharge.  On presentation, he was hypotensive, tachycardic with A. fib with RVR with CT suggestive of first MTP osteomyelitis and soft tissue gas concerning for possible necrotizing fasciitis along with AKI with creatinine of 2.7 and mild DKA.  His blood pressures did not improve with IV fluids and he was transferred to ICU and PCCM was consulted.  Orthopedics was consulted.  He underwent left transtibial amputation on 07/23/2021.  He was found to have strep agalactiae bacteremia.  ID was consulted.  He was found to have vertebral discitis versus osteomyelitis on MRI.  He was also found to have new systolic dysfunction with LVEF of 35% along with new onset atrial flutter requiring IV amiodarone which has been switched to oral.  Cardiology was consulted.  He has been started on Eliquis.  S/p TEE followed by DCCV on 11/22.  Developed worsening back pain 11/26-11/27, ID reconsulted, requested MRI T/L-spine, ultimately performed 12/1, with contrast which showed Significant progression of discitis and osteomyelitis at T5 through T8. There is edema extending into the posterior elements. Extensive posterior epidural abscess has progressed since the prior study. Abscess extends from T3 through T9 and is exerting mass-effect on the cord with moderate spinal stenosis T4 through T8.   Due to report of pleural effusions on MRI, thoracentesis was attempted but there was insufficient fluid volume.   Neurosurgery, Dr. Ronnald Ramp, has evaluated the patient recommends no surgery. ID has changed antibiotics to vancomycin with plans for protracted IV abx course thru PICC. He  continues to have PT regularly, getting hydrotherapy for sacral wound which appeared to have infection for which cefepime was added.   Assessment & Plan: Principal Problem:   Discitis of thoracic region Active Problems:   Essential hypertension   Stage 3a chronic kidney disease (HCC)   Type 2 diabetes mellitus with diabetic polyneuropathy, with long-term current use of insulin (HCC)   Right below-knee amputee (HCC)   Diabetic infection of left foot (HCC)   Chronic diastolic (congestive) heart failure (HCC)   Severe sepsis with acute organ dysfunction (HCC)   Chest pain   AKI (acute kidney injury) ([place])   Gas gangrene of foot (HCC)   Atrial flutter (HCC)   Septic shock (HCC)   Systolic dysfunction   Pressure injury of skin   MSSA bacteremia  Septic shock:  -Evolving on admission from bacteremia/diabetic foot infection  - Off pressors.  Shock resolved.   Diabetic left foot infection:  - Status post left transtibial amputation on 07/23/2021. - Wound/wound VAC DC'ed per Dr. Sharol Given, recommends dry dressing which has been applied.   -Patient evaluated by Dr. Sharol Given, to DC staples today.     Group B streptococcus bacteremia, progressive thoracic discitis/osteomyelitis.  - No vegetation on TTE or TEE during cardioversion.  MRI repeated with contrast on 12/1 showed Significant progression of discitis and osteomyelitis at T5 through T8 and extensive posterior epidural abscess has progressed since the prior study extending T3 through T9 exerting mass-effect on the cord with moderate spinal stenosis T4 through T8. - Neurosurgery consulted, continues to recommend IV antibiotics and advise against surgery in this difficult situation. Fortunately, there is clinical improvement with this strategy.  Did discuss with Dr. Ronnald Ramp, will repeat  MRI thoracic spine to evaluate for progression.   - Continue IV abx thru PICC (placed 12/7). Total duration TBD(but will need at least total of 8 weeks of IV  vancomycin as D/W ID) cleared blood cultures 07/24/2021.   Sacral pressure wound:  -Infection noted 12/8. D/w ID, culture is unlikely to be helpful here. Not becoming septic, WBC and temperature remain wnl.  - Started cefepime to include pseudomonas coverage, plan 7 days. - Continue offloading/turning, and getting OOB. - Continue topical nystatin -Continue with hydrotherapy  New onset systolic CHF: Echo showed EF of 35% (previous echo showed EF of 50 to 55% in 04/2021). Cardiomyopathy may be from new onset atrial flutter RVR related tachycardia versus stress induced cardiomyopathy from bacteremia and septic shock. - Cardiology no longer following, recommended torsemide 75m QOD for now. - Continue metop succinate, bidil. CrCl limited ACE/ARB/ARNI and MRA. - HF navigator consulted.  New onset atrial flutter: Maintained NSR since DCCV 11/22 - Continue metoprolol succinate 543mdaily. - Continue amiodarone 20059maily - Restarted eliquis since no procedural plans, preferably uninterrupted anticoagulation for 1 month post DCCV.  Acute urinary retention:  - Pt reports retention-type symptoms chronically and has repeatedly required I&O caths which are met with significant resistance. Strong suspicion for BPH. Pt also reports hx penis fracture not repaired surgically years ago. -Started on finasteride, tamsulosin, Foley catheter inserted 12/13, he will need voiding trial in few days , will need to follow with urology as an outpatient regardless.   -Will attempt voiding trial in 2 to 3 days.  T2DM uncontrolled with hyperglycemia:  - Continue basal-bolus insulin.  CBG better improved after increasing his Semglee to 36 units .continue with insulin sliding scale and scheduled Premeal NovoLog .  AKI on stage IIIa CKD: AKI resolving with limitation on diuresis. - Monitor intermittently while on vancomycin. CrCl now improved.   Pt takes testosterone/anabolic steroids (not prescription):  - Free level  checked 11/29, low at 4.0. Rechecked 12/5, normal at 8.8. Albumin is 1.6 making total level falsely low.   Hyponatremia: Improved.   Hypomagnesemia: Improved with supplementation.  - Follow periodically.   Leukocytosis: Resolved.   Thrombocytopenia: Resolved    Generalized deconditioning - PT recommends CIR vs. SNF. Pt making gains and working with PT -  Unable to DC to SNF while undergoing hydrotherapy.  Recommendation for LTAC,   Acute on chronic pain: Follows with Dr. RamNelva Bushain management.  Has received steroid injections to the back which, we've confirmed, will be delayed until completion of IV antibiotics. - Reviewed [place] PDMP and patient regularly fills opioid prescriptions. Likely opioid-dependent. - Obviously has reasons for acute pain from recent surgery, discitis/osteomyelitis. Opioid tolerance PTA and may be increasing here. Bowel regimen also ordered. - Continue multimodal pain control as ordered: - Tylenol 1 g 3 times daily. - Oxy IR 10 mg every 4 hourly as needed for moderate pain - OxyContin 15 Mg every 12 hours - Lidocaine patch increased to 2 patches - Dilaudid 0.5 mg IV every 4 hourly as needed severe pain. Discussed need to limit/discontinue this, though prior to hydrotherapy will remain available. - Gabapentin 300 mg 3 times daily   Insomnia - Continue trazodone 100m62mS  Liver nodularity: Noted incidentally on CT chest 04/09/2021. Hypoalbuminemia noted. No ascites or LE edema, or signs of hepatic encephalopathy. Ferritin wnl. - INR was elevated to 1.4 when checked 11/14.   Pulmonary nodules: Unchanged on last CT chest 5mm 40m smalled definitively benign, no further evaluation recommended.  Reported renal lesion: Per pt. Follows with Dr. Louis Meckel.  - Follow up with Dr. Louis Meckel recommended.   Pleural effusion: Chronic, multifactorial partially related to CHF and to hypoalbuminemia. Not sufficient for thoracentesis.   RN Pressure Injury Documentation: Pressure  Injury 07/27/21 Sacrum Mid Unstageable - Full thickness tissue loss in which the base of the injury is covered by slough (yellow, tan, gray, green or brown) and/or eschar (tan, brown or black) in the wound bed. small pink pressure ulcer with bre (Active)  07/27/21 0255  Location: Sacrum  Location Orientation: Mid  Staging: Unstageable - Full thickness tissue loss in which the base of the injury is covered by slough (yellow, tan, gray, green or brown) and/or eschar (tan, brown or black) in the wound bed.  Wound Description (Comments): small pink pressure ulcer with break in skin 08/12/21; updated to unstageable pressure injury  Present on Admission: Yes   DVT prophylaxis: Eliquis Code Status: Full Family Communication: None at bedside. Disposition Plan:  Status is: Inpatient  Remains inpatient appropriate because: Patient is appropriate candidate for LTAC, but he was denied by peer to peer today.  Consultants:  ID Neurosurgery PCCM Cardiology  Procedures:  Left TTA 07/23/2021 TEE/DCCV 07/29/2021 PICC RUE 08/13/2021  Antimicrobials: Cefazolin 11/16 Ceftriaxone 11/14, 11/16-17 Clindamycin 11/15-16 Linezolid 11/15 Pen G 11/18 Pip-tazo 11/15-11/16 Vancomycin 11/14 Cefepime 12/8 - 12/12  Subjective:  Patient report he is tolerating more physical therapy, back pain is better controlled.    Objective: Vitals:   08/21/21 2300 08/22/21 0000 08/22/21 0326 08/22/21 0758  BP:  (!) 147/86 138/72 115/61  Pulse:  68 60 63  Resp: '18 16 19 14  ' Temp:  98.1 F (36.7 C) 97.8 F (36.6 C) 98.6 F (37 C)  TempSrc:  Oral Oral Oral  SpO2:  95% 97% 96%  Weight:      Height:        Intake/Output Summary (Last 24 hours) at 08/22/2021 1058 Last data filed at 08/22/2021 0328 Gross per 24 hour  Intake 200 ml  Output 6225 ml  Net -6025 ml   Filed Weights   07/27/21 0252 07/29/21 0754 08/01/21 0459  Weight: 108.2 kg 108.2 kg 107 kg      Awake Alert, Oriented X 3, No new F.N deficits,  Normal affect Symmetrical Chest wall movement, Good air movement bilaterally, CTAB RRR,No Gallops,Rubs or new Murmurs, No Parasternal Heave +ve B.Sounds, Abd Soft, No tenderness, No rebound - guarding or rigidity. No Cyanosis, Clubbing or edema, bilateral BKA.  Data Reviewed: I have personally reviewed following labs and imaging studies  CBC: Recent Labs  Lab 08/17/21 0700 08/20/21 0330  WBC 6.6 7.3  HGB 13.1 12.8*  HCT 40.9 39.8  MCV 91.5 90.7  PLT 304 815   Basic Metabolic Panel: Recent Labs  Lab 08/17/21 0700 08/20/21 0330 08/21/21 0905  NA 133* 131* 136  K 4.3 3.8 4.1  CL 101 99 104  CO2 '25 26 27  ' GLUCOSE 268* 206* 242*  BUN 23* 27* 24*  CREATININE 1.20 1.33* 1.24  CALCIUM 8.4* 8.2* 8.5*   GFR: Estimated Creatinine Clearance: 89 mL/min (by C-G formula based on SCr of 1.24 mg/dL). Liver Function Tests: Recent Labs  Lab 08/17/21 0700 08/20/21 0330  AST 21 20  ALT 39 33  ALKPHOS 102 108  BILITOT 0.5 0.4  PROT 5.8* 5.8*  ALBUMIN 1.8* 1.9*   No results for input(s): LIPASE, AMYLASE in the last 168 hours. No results for input(s): AMMONIA in the last 168 hours.  Coagulation Profile: No results for input(s): INR, PROTIME in the last 168 hours. Cardiac Enzymes: No results for input(s): CKTOTAL, CKMB, CKMBINDEX, TROPONINI in the last 168 hours. BNP (last 3 results) No results for input(s): PROBNP in the last 8760 hours. HbA1C: No results for input(s): HGBA1C in the last 72 hours. CBG: Recent Labs  Lab 08/21/21 0626 08/21/21 1147 08/21/21 1544 08/21/21 2146 08/22/21 0607  GLUCAP 223* 263* 215* 267* 150*   Lipid Profile: No results for input(s): CHOL, HDL, LDLCALC, TRIG, CHOLHDL, LDLDIRECT in the last 72 hours. Thyroid Function Tests: No results for input(s): TSH, T4TOTAL, FREET4, T3FREE, THYROIDAB in the last 72 hours. Anemia Panel: No results for input(s): VITAMINB12, FOLATE, FERRITIN, TIBC, IRON, RETICCTPCT in the last 72 hours.  Urine analysis:     Component Value Date/Time   COLORURINE YELLOW 07/22/2021 0250   APPEARANCEUR CLOUDY (A) 07/22/2021 0250   LABSPEC 1.018 07/22/2021 0250   PHURINE 5.0 07/22/2021 0250   GLUCOSEU >=500 (A) 07/22/2021 0250   HGBUR SMALL (A) 07/22/2021 0250   BILIRUBINUR NEGATIVE 07/22/2021 0250   KETONESUR 5 (A) 07/22/2021 0250   PROTEINUR 30 (A) 07/22/2021 0250   UROBILINOGEN 0.2 09/13/2019 0906   NITRITE NEGATIVE 07/22/2021 0250   LEUKOCYTESUR TRACE (A) 07/22/2021 0250   No results found for this or any previous visit (from the past 240 hour(s)).    Radiology Studies: No results found.  Scheduled Meds:  acetaminophen  1,000 mg Oral TID   amiodarone  200 mg Oral Daily   apixaban  5 mg Oral BID   vitamin C  1,000 mg Oral Daily   Chlorhexidine Gluconate Cloth  6 each Topical Daily   collagenase   Topical Daily   docusate sodium  100 mg Oral Daily   finasteride  5 mg Oral Daily   gabapentin  300 mg Oral TID   insulin aspart  0-15 Units Subcutaneous TID WC   insulin aspart  0-5 Units Subcutaneous QHS   insulin aspart  4 Units Subcutaneous TID WC   insulin detemir  36 Units Subcutaneous BID   isosorbide mononitrate  30 mg Oral Daily   lidocaine  2 patch Transdermal Q24H   methocarbamol  1,000 mg Oral Q8H   metoprolol succinate  50 mg Oral Daily   multivitamin with minerals  1 tablet Oral Daily   nutrition supplement (JUVEN)  1 packet Oral BID BM   nystatin cream   Topical BID   oxyCODONE  15 mg Oral Q12H   polyethylene glycol  17 g Oral Daily   Ensure Max Protein  11 oz Oral BID BM   sodium chloride flush  10-40 mL Intracatheter Q12H   tamsulosin  0.4 mg Oral Daily   torsemide  20 mg Oral QODAY   Continuous Infusions:  sodium chloride Stopped (08/13/21 0457)   vancomycin 1,000 mg (08/22/21 0555)     LOS: 31 days    Phillips Climes, MD [facility] [email or web address] 08/22/2021, 10:58 AM

## 2021-08-22 NOTE — Progress Notes (Signed)
[name], male   DOB: 16-Nov-1965, 55 y.o.   MRN: [id number] Patient's left below-knee amputation is healed well.  I will write orders to remove the staples continue with the stump shrinker's.

## 2021-08-22 NOTE — Progress Notes (Signed)
Physical Therapy Treatment Patient Details Name: [name] MRN: [id number] DOB: 08/10/1966 Today's Date: 08/22/2021   History of Present Illness Pt is a 55 y.o. 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 S/p TEE, DCCV on 11/22. Chest CT 12/4 with pleural effusion; thoracentesis attempted with insufficient fluid volume. PMH includes R BKA, aflutter, DM, HTN, depression, anxiety.    PT Comments    Returned 1 hour after placing pt in chair to assist COTA in returning pt to bed as had planned. Patient complained that his wound site was too painful to tolerate sitting up long but no complaints of back pain from sitting, suggesting improved activity tolerance if it were not for the wound. Patient was max assist +3 to return to EOB and mod assist of 2 to return to supine. Patient making gains progressing with transfers. Will continue to follow acutely. Current recommendations remain appropriate.    Recommendations for follow up therapy are one component of a multi-disciplinary discharge planning process, led by the attending physician.  Recommendations may be updated based on patient status, additional functional criteria and insurance authorization.  Follow Up Recommendations  Acute inpatient rehab (3hours/day) (per chart, may go LTACH)     Assistance Recommended at Discharge Frequent or constant Supervision/Assistance  Equipment Recommendations  Wheelchair (measurements PT);Wheelchair cushion (measurements PT);Other (comment) (bilateral BKA w/c assessment; may need slide board and drop arm BSC?; further assessment post acute)    Recommendations for Other Services       Precautions / Restrictions Precautions Precautions: Fall;Back Precaution Booklet Issued:  No Precaution Comments: Back precautions for comfort Required Braces or Orthoses: Other Brace Other Brace: LLE limb guard; has RLE prosthetic in room (not fitting currently per pt?) Restrictions Weight Bearing Restrictions: Yes LLE Weight Bearing: Non weight bearing Other Position/Activity Restrictions: RLE prosthetic is not fitting currently per pt     Mobility  Bed Mobility Overal bed mobility: Needs Assistance Bed Mobility: Sit to Supine     Supine to sit: Mod assist;+2 for physical assistance Sit to supine: Mod assist;+2 for physical assistance;+2 for safety/equipment;HOB elevated   General bed mobility comments: ModAx2 to manage trunk and legs back to supine, with pt using rail and trapeze    Transfers Overall transfer level: Needs assistance Equipment used: None Transfers: Bed to chair/wheelchair/BSC            Lateral/Scoot Transfers: Max assist;+2 physical assistance;+2 safety/equipment (+3 assist) General transfer comment: Patient tolerated 1 hour of sitting up in recliner and required max assist +3 to return to EOB to reduce sheer force on buttocks with pads used to assist    Ambulation/Gait               General Gait Details: Unable   Stairs             Wheelchair Mobility    Modified Rankin (Stroke Patients Only)       Balance Overall balance assessment: Needs assistance Sitting-balance support: Single extremity supported;Bilateral upper extremity supported Sitting balance-Leahy Scale: Poor Sitting balance - Comments: min to mod assist to sit EOB with one to two extremties assisting Postural control: Posterior lean;Right lateral lean     Standing balance comment: Unable  Cognition Arousal/Alertness: Awake/alert Behavior During Therapy: WFL for tasks assessed/performed Overall Cognitive Status: No family/caregiver present to determine baseline cognitive functioning Area of Impairment:  Attention;Safety/judgement;Awareness;Problem solving                   Current Attention Level: Selective     Safety/Judgement: Decreased awareness of deficits Awareness: Emergent Problem Solving: Requires verbal cues;Difficulty sequencing;Requires tactile cues General Comments: patient motivated towards getting out of bed but difficulty with sitting tolerance due to wound        Exercises      General Comments General comments (skin integrity, edema, etc.): Further attempted to encourage pt to perform stretching AND core exercises      Pertinent Vitals/Pain Pain Assessment: Faces Faces Pain Scale: Hurts even more Pain Location: back and wound site Pain Descriptors / Indicators: Grimacing;Guarding;Moaning Pain Intervention(s): Limited activity within patient's tolerance;Monitored during session;Repositioned    Home Living                          Prior Function            PT Goals (current goals can now be found in the care plan section) Acute Rehab PT Goals Patient Stated Goal: to get back to bed PT Goal Formulation: With patient Time For Goal Achievement: 09/01/21 Potential to Achieve Goals: Fair Progress towards PT goals: Progressing toward goals    Frequency    Min 3X/week      PT Plan Current plan remains appropriate    Co-evaluation PT/OT/SLP Co-Evaluation/Treatment: Yes Reason for Co-Treatment: Complexity of the patient's impairments (multi-system involvement);For patient/therapist safety;To address functional/ADL transfers PT goals addressed during session: Mobility/safety with mobility;Balance OT goals addressed during session: Strengthening/ROM      AM-PAC PT "6 Clicks" Mobility   Outcome Measure  Help needed turning from your back to your side while in a flat bed without using bedrails?: A Little Help needed moving from lying on your back to sitting on the side of a flat bed without using bedrails?: Total Help needed moving  to and from a bed to a chair (including a wheelchair)?: Total Help needed standing up from a chair using your arms (e.g., wheelchair or bedside chair)?: Total Help needed to walk in hospital room?: Total Help needed climbing 3-5 steps with a railing? : Total 6 Click Score: 8    End of Session   Activity Tolerance: Patient tolerated treatment well Patient left: in bed;with call bell/phone within reach Nurse Communication: Mobility status PT Visit Diagnosis: Other abnormalities of gait and mobility (R26.89);Muscle weakness (generalized) (M62.81);Pain Pain - part of body:  (back)     Time: 9233-0076 PT Time Calculation (min) (ACUTE ONLY): 12 min  Charges:  n/a this session                     Moishe Spice, PT, DPT Acute Rehabilitation Services  Pager: [phone] Office: [place] 08/22/2021, 1:25 PM

## 2021-08-22 NOTE — Progress Notes (Signed)
Physical Therapy Treatment Patient Details Name: Ronak Duquette MRN: [id number] DOB: 02/13/1966 Today's Date: 08/22/2021   History of Present Illness Pt is a 55 y.o. 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 S/p TEE, DCCV on 11/22. Chest CT 12/4 with pleural effusion; thoracentesis attempted with insufficient fluid volume. PMH includes R BKA, aflutter, DM, HTN, depression, anxiety.    PT Comments    Pt is making great progress with mobility this date as he was able to perform a lateral scoot transfer to the R bed > recliner today. However, due to gross weakness and balance deficits, he required maxAx3 to do so. Pt also with improved initiation of bed mobility, only requiring modAx2 to transition supine > sit today. Pt displayed improved activity tolerance and motivation to be OOB. Will continue to follow acutely. As his activity tolerance and he is making functional progress, he would greatly benefit from inpatient rehab at d/c, but chart indicates he may d/c to [facility] instead.    Recommendations for follow up therapy are one component of a multi-disciplinary discharge planning process, led by the attending physician.  Recommendations may be updated based on patient status, additional functional criteria and insurance authorization.  Follow Up Recommendations  Acute inpatient rehab (3hours/day) (per chart, may go [facility]ACH)     Assistance Recommended at Discharge Frequent or constant Supervision/Assistance  Equipment Recommendations  Wheelchair (measurements PT);Wheelchair cushion (measurements PT);Other (comment) (bilateral BKA w/c assessment; may need slide board and drop arm BSC?; further assessment post acute)    Recommendations for Other Services       Precautions /  Restrictions Precautions Precautions: Fall;Back Precaution Booklet Issued: No Precaution Comments: Back precautions for comfort Required Braces or Orthoses: Other Brace Other Brace: LLE limb guard; has RLE prosthetic in room (not fitting currently per pt?) Restrictions Weight Bearing Restrictions: Yes LLE Weight Bearing: Non weight bearing Other Position/Activity Restrictions: RLE prosthetic is not fitting currently per pt     Mobility  Bed Mobility Overal bed mobility: Needs Assistance Bed Mobility: Supine to Sit     Supine to sit: Mod assist;+2 for physical assistance;HOB elevated;+2 for safety/equipment     General bed mobility comments: Pt using overhead trapeze to initiate trunk ascension and pivot of hips to R to exit R EOB. ModAx2 to complete hip pivot using bed pad and to ascend trunk, better initiation by pt and improved speed but still slow today.    Transfers Overall transfer level: Needs assistance Equipment used: None Transfers: Bed to chair/wheelchair/BSC            Lateral/Scoot Transfers: Max assist;+2 physical assistance;+2 safety/equipment;From elevated surface (+3) General transfer comment: Pt agreeable to lateral scoot towards R from elevated EOB to recliner, cuing pt to scoot on bed x2 reps to align with chair. Pt needing maxAx3 to lift and reduce shear force on buttocks via use of bed pads with lateral transfer to chair.    Ambulation/Gait               General Gait Details: Unable   Stairs             Wheelchair Mobility    Modified Rankin (Stroke Patients Only)       Balance Overall balance assessment: Needs assistance Sitting-balance support: Single extremity supported;Bilateral upper extremity supported Sitting balance-Leahy Scale: Poor  Sitting balance - Comments: Reliant on 1-2 UE support and min-modA to sit EOB Postural control: Posterior lean;Right lateral lean     Standing balance comment: Unable                             Cognition Arousal/Alertness: Awake/alert Behavior During Therapy: WFL for tasks assessed/performed Overall Cognitive Status: No family/caregiver present to determine baseline cognitive functioning Area of Impairment: Attention;Safety/judgement;Awareness;Problem solving                   Current Attention Level: Selective     Safety/Judgement: Decreased awareness of deficits Awareness: Emergent Problem Solving: Requires verbal cues;Difficulty sequencing;Requires tactile cues General Comments: Pt particular about what exercises he will and will not do, even though performed several times before, now stating he cannot due to being in bed. Pt very compliant with exercises brought up by his son. Difficult to redirect and educate on need to strengthen core, even though was focused on this multiple session prior, now focusing on stretching. Needs multi-modal cues for sequencing. Likely baseline.        Exercises      General Comments General comments (skin integrity, edema, etc.): Educated pt on need to activate core muscles and to perform modified sit ups in chair while up, but pt continually redirecting conversation to stretching      Pertinent Vitals/Pain Pain Assessment: Faces Faces Pain Scale: Hurts even more Pain Location: back Pain Descriptors / Indicators: Grimacing;Guarding;Moaning Pain Intervention(s): Limited activity within patient's tolerance;Monitored during session;Repositioned;RN gave pain meds during session    Home Living                          Prior Function            PT Goals (current goals can now be found in the care plan section) Acute Rehab PT Goals Patient Stated Goal: to sit in chair PT Goal Formulation: With patient Time For Goal Achievement: 09/01/21 Potential to Achieve Goals: Fair Progress towards PT goals: Progressing toward goals    Frequency    Min 3X/week      PT Plan Current plan remains  appropriate    Co-evaluation PT/OT/SLP Co-Evaluation/Treatment: Yes Reason for Co-Treatment: Complexity of the patient's impairments (multi-system involvement);For patient/therapist safety;To address functional/ADL transfers PT goals addressed during session: Mobility/safety with mobility;Balance;Strengthening/ROM        AM-PAC PT "6 Clicks" Mobility   Outcome Measure  Help needed turning from your back to your side while in a flat bed without using bedrails?: A Little Help needed moving from lying on your back to sitting on the side of a flat bed without using bedrails?: Total Help needed moving to and from a bed to a chair (including a wheelchair)?: Total Help needed standing up from a chair using your arms (e.g., wheelchair or bedside chair)?: Total Help needed to walk in hospital room?: Total Help needed climbing 3-5 steps with a railing? : Total 6 Click Score: 8    End of Session   Activity Tolerance: Patient tolerated treatment well Patient left: with call bell/phone within reach;in chair Nurse Communication: Mobility status PT Visit Diagnosis: Other abnormalities of gait and mobility (R26.89);Muscle weakness (generalized) (M62.81);Pain Pain - part of body:  (back)     Time: 1101-1136 PT Time Calculation (min) (ACUTE ONLY): 35 min  Charges:  $Therapeutic Activity: 8-22 mins  Moishe Spice, PT, DPT Acute Rehabilitation Services  Pager: [phone] Office: [place] 08/22/2021, 11:51 AM

## 2021-08-22 NOTE — Progress Notes (Signed)
Physical Therapy Wound Treatment Patient Details  Name: [name] MRN: [id number] Date of Birth: 10/08/1965  Today's Date: 08/22/2021 Time: 7741-2878 Time Calculation (min): 38 min  Subjective  Subjective Assessment Subjective: Pt pleasant and agreeable to hydrotherapy. Complaining of pain from sitting up in the chair earlier. Patient and Family Stated Goals: Home to family Date of Onset:  (Unknown) Prior Treatments:  (Dressing changes)  Pain Score:  Premedicated and tolerated treatment well without complaints of pain.   Wound Assessment  Pressure Injury 07/27/21 Sacrum Mid Unstageable - Full thickness tissue loss in which the base of the injury is covered by slough (yellow, tan, gray, green or brown) and/or eschar (tan, brown or black) in the wound bed. small pink pressure ulcer with bre (Active)  Dressing Type Barrier Film (skin prep);Dakin's-soaked gauze;Foam - Lift dressing to assess site every shift;Moist to moist 08/22/21 1338  Dressing Changed;Clean;Dry;Intact 08/22/21 1338  Dressing Change Frequency Daily 08/22/21 1338  State of Healing Non-healing 08/22/21 1338  Site / Wound Assessment Brown;Red;Yellow 08/22/21 1338  % Wound base Red or Granulating 40% 08/22/21 1338  % Wound base Yellow/Fibrinous Exudate 50% 08/22/21 1338  % Wound base Black/Eschar 10% 08/22/21 1338  % Wound base Other/Granulation Tissue (Comment) 0% 08/22/21 1338  Peri-wound Assessment Intact;Purple 08/22/21 1338  Wound Length (cm) 7 cm 08/21/21 1300  Wound Width (cm) 6 cm 08/21/21 1300  Wound Depth (cm) 1.8 cm 08/21/21 1300  Wound Surface Area (cm^2) 42 cm^2 08/21/21 1300  Wound Volume (cm^3) 75.6 cm^3 08/21/21 1300  Undermining (cm) 1.7 cm from 11:00-1:00 08/21/21 1300  Margins Unattached edges (unapproximated) 08/22/21 1338  Drainage Amount Moderate 08/22/21 1338  Drainage Description Serosanguineous 08/22/21 1338  Treatment Debridement (Selective);Hydrotherapy (Pulse lavage);Packing (Saline  gauze) 08/22/21 1338      Hydrotherapy Pulsed lavage therapy - wound location: sacral Pulsed Lavage with Suction (psi): 8 psi (8-12) Pulsed Lavage with Suction - Normal Saline Used: 1000 mL Pulsed Lavage Tip: Tip with splash shield Selective Debridement Selective Debridement - Location: sacral Selective Debridement - Tools Used: Forceps, Scalpel, Scissors Selective Debridement - Tissue Removed: eschar    Wound Assessment and Plan  Wound Therapy - Assess/Plan/Recommendations Wound Therapy - Clinical Statement: Progressing with debridement. Conservative removal of necrotic connective tissue to protect integrity of the overall fascia. Dakin's orders expired and Santyl resumed this date. Will continue to follow for selective removal of unviable tissue, to decrease bioburden, and promote wound bed healing. Wound Therapy - Functional Problem List: Global weakness Factors Delaying/Impairing Wound Healing: Diabetes Mellitus, Incontinence, Infection - systemic/local, Immobility, Multiple medical problems Hydrotherapy Plan: Debridement, Dressing change, Patient/family education, Pulsatile lavage with suction Wound Therapy - Frequency: 6X / week Wound Therapy - Current Recommendations: PT Wound Therapy - Follow Up Recommendations: dressing changes by family/patient, dressing changes by RN  Wound Therapy Goals- Improve the function of patient's integumentary system by progressing the wound(s) through the phases of wound healing (inflammation - proliferation - remodeling) by: Wound Therapy Goals - Improve the function of patient's integumentary system by progressing the wound(s) through the phases of wound healing by: Decrease Necrotic Tissue to: 20% Decrease Necrotic Tissue - Progress: Progressing toward goal Increase Granulation Tissue to: 80% Increase Granulation Tissue - Progress: Progressing toward goal Goals/treatment plan/discharge plan were made with and agreed upon by patient/family:  Yes Time For Goal Achievement: 7 days Wound Therapy - Potential for Goals: Good  Goals will be updated until maximal potential achieved or discharge criteria met.  Discharge criteria: when goals achieved, discharge from  hospital, MD decision/surgical intervention, no progress towards goals, refusal/missing three consecutive treatments without notification or medical reason.  GP     Charges PT Wound Care Charges $Wound Debridement up to 20 cm: < or equal to 20 cm $ Wound Debridement each add'l 20 sqcm: 1 $PT Hydrotherapy Dressing: 1 dressing $PT PLS Gun and Tip: 1 Supply $PT Hydrotherapy Visit: 1 Visit       [name] 08/22/2021, 2:24 PM  Rolinda Roan, PT, DPT Acute Rehabilitation Services Pager: [phone] Office: [phone]

## 2021-08-23 LAB — GLUCOSE, CAPILLARY
Glucose-Capillary: 253 mg/dL — ABNORMAL HIGH (ref 70–99)
Glucose-Capillary: 245 mg/dL — ABNORMAL HIGH (ref 70–99)
Glucose-Capillary: 83 mg/dL (ref 70–99)
Glucose-Capillary: 194 mg/dL — ABNORMAL HIGH (ref 70–99)
Glucose-Capillary: 249 mg/dL — ABNORMAL HIGH (ref 70–99)

## 2021-08-23 MED ORDER — TRAZODONE HCL 150 MG PO TABS
150.0000 mg | ORAL_TABLET | Freq: Every evening | ORAL | Status: DC | PRN
  Administered 2021-08-23 – 2021-09-08 (×16): 150 mg via ORAL
  Filled 2021-08-23 (×3): qty 3
  Filled 2021-08-23: qty 1
  Filled 2021-08-23 (×2): qty 3
  Filled 2021-08-23 (×2): qty 1
  Filled 2021-08-23 (×2): qty 3
  Filled 2021-08-23 (×2): qty 1
  Filled 2021-08-23: qty 3
  Filled 2021-08-23 (×3): qty 1
  Filled 2021-08-23: qty 3

## 2021-08-23 MED ORDER — TESTOSTERONE 50 MG/5GM (1%) TD GEL
5.0000 g | Freq: Every day | TRANSDERMAL | Status: DC
  Administered 2021-08-24 – 2021-09-09 (×17): 5 g via TRANSDERMAL
  Filled 2021-08-23 (×17): qty 5

## 2021-08-23 MED ORDER — INSULIN ASPART 100 UNIT/ML IJ SOLN
6.0000 [IU] | Freq: Three times a day (TID) | INTRAMUSCULAR | Status: DC
  Administered 2021-08-23 – 2021-09-09 (×51): 6 [IU] via SUBCUTANEOUS

## 2021-08-23 NOTE — Progress Notes (Addendum)
PROGRESS NOTE  [name]  NWG:[id number] DOB: 01/16/1966 DOA: 07/21/2021 PCP: Patient, No Pcp Per (Inactive)   Brief Narrative:  55 year old male with history of diabetes mellitus type 2, diabetic foot infection with prior right BKA, chronic diastolic CHF, hypertension, dyslipidemia presented with worsening left foot swelling/discoloration and discharge.  On presentation, he was hypotensive, tachycardic with A. fib with RVR with CT suggestive of first MTP osteomyelitis and soft tissue gas concerning for possible necrotizing fasciitis along with AKI with creatinine of 2.7 and mild DKA.  His blood pressures did not improve with IV fluids and he was transferred to ICU and PCCM was consulted.  Orthopedics was consulted.  He underwent left transtibial amputation on 07/23/2021.  He was found to have strep agalactiae bacteremia.  ID was consulted.  He was found to have vertebral discitis versus osteomyelitis on MRI.  He was also found to have new systolic dysfunction with LVEF of 35% along with new onset atrial flutter requiring IV amiodarone which has been switched to oral.  Cardiology was consulted.  He has been started on Eliquis.  S/p TEE followed by DCCV on 11/22.  Developed worsening back pain 11/26-11/27, ID reconsulted, requested MRI T/L-spine, ultimately performed 12/1, with contrast which showed Significant progression of discitis and osteomyelitis at T5 through T8. There is edema extending into the posterior elements. Extensive posterior epidural abscess has progressed since the prior study. Abscess extends from T3 through T9 and is exerting mass-effect on the cord with moderate spinal stenosis T4 through T8.   Due to report of pleural effusions on MRI, thoracentesis was attempted but there was insufficient fluid volume.   Neurosurgery, Dr. Ronnald Ramp, has evaluated the patient recommends no surgery. ID has changed antibiotics to vancomycin with plans for protracted IV abx course thru PICC. He  continues to have PT regularly, getting hydrotherapy for sacral wound which appeared to have infection for which cefepime was added.   Assessment & Plan: Principal Problem:   Discitis of thoracic region Active Problems:   Essential hypertension   Stage 3a chronic kidney disease (HCC)   Type 2 diabetes mellitus with diabetic polyneuropathy, with long-term current use of insulin (HCC)   Right below-knee amputee (HCC)   Diabetic infection of left foot (HCC)   Chronic diastolic (congestive) heart failure (HCC)   Severe sepsis with acute organ dysfunction (HCC)   Chest pain   AKI (acute kidney injury) ([place])   Gas gangrene of foot (HCC)   Atrial flutter (HCC)   Septic shock (HCC)   Systolic dysfunction   Pressure injury of skin   MSSA bacteremia   Septic shock:  -Evolving on admission from bacteremia/diabetic foot infection  - Off pressors.  Shock resolved.   Diabetic left foot infection:  - Status post left transtibial amputation on 07/23/2021. - Wound/wound VAC DC'ed per Dr. Sharol Given, recommends dry dressing which has been applied.   -stapels were removed 12/16   Group B streptococcus bacteremia, progressive thoracic discitis/osteomyelitis.  - No vegetation on TTE or TEE during cardioversion.  MRI repeated with contrast on 12/1 showed Significant progression of discitis and osteomyelitis at T5 through T8 and extensive posterior epidural abscess has progressed since the prior study extending T3 through T9 exerting mass-effect on the cord with moderate spinal stenosis T4 through T8. - Neurosurgery consulted, continues to recommend IV antibiotics and advise against surgery in this difficult situation. Fortunately, there is clinical improvement with this strategy.  Repeat MRI 12/16 showing improvement in the osteomyelitis and discitis and in the  epidural abscess, patient remains clinically stable, neurosurgery has signed off, but will be available for any concern or questions, patient need to  follow-up with Dr. Ronnald Ramp about 3 weeks after discharge. - Continue IV abx thru PICC (placed 12/7). Total duration TBD(but will need at least total of 8 weeks of IV vancomycin as D/W ID) cleared blood cultures 07/24/2021.   Sacral pressure wound:  -Infection noted 12/8. D/w ID, culture is unlikely to be helpful here. Not becoming septic, WBC and temperature remain wnl.  - Started cefepime to include pseudomonas coverage, plan 7 days. - Continue offloading/turning, and getting OOB. - Continue topical nystatin -Continue with hydrotherapy  New onset systolic CHF: Echo showed EF of 35% (previous echo showed EF of 50 to 55% in 04/2021). Cardiomyopathy may be from new onset atrial flutter RVR related tachycardia versus stress induced cardiomyopathy from bacteremia and septic shock. - Cardiology no longer following, recommended torsemide 25m QOD for now. - Continue metop succinate, bidil. CrCl limited ACE/ARB/ARNI and MRA. - HF navigator consulted.  New onset atrial flutter: Maintained NSR since DCCV 11/22 - Continue metoprolol succinate 566mdaily. - Continue amiodarone 20047maily - Restarted eliquis since no procedural plans, preferably uninterrupted anticoagulation for 1 month post DCCV.  Acute urinary retention:  - Pt reports retention-type symptoms chronically and has repeatedly required I&O caths which are met with significant resistance. Strong suspicion for BPH. Pt also reports hx penis fracture not repaired surgically years ago. -Started on finasteride, tamsulosin, Foley catheter inserted 12/13, he will need voiding trial in few days , will need to follow with urology as an outpatient regardless.   -Will attempt voiding trial in 2 to 3 days.  T2DM uncontrolled with hyperglycemia:  - Continue basal-bolus insulin.  CBG better improved after increasing his Semglee to 36 units .continue with insulin sliding scale and scheduled Premeal NovoLog .  AKI on stage IIIa CKD: AKI resolving with  limitation on diuresis. - Monitor intermittently while on vancomycin. CrCl now improved.   Pt takes testosterone/anabolic steroids (not prescription):  - Free level checked 11/29, low at 4.0. Rechecked 12/5, borderline low at  8.8.    Hyponatremia: Improved.   Hypomagnesemia: Improved with supplementation.  - Follow periodically.   Leukocytosis: Resolved.   Thrombocytopenia: Resolved    Generalized deconditioning - PT recommends CIR vs. SNF. Pt making gains and working with PT -  Unable to DC to SNF while undergoing hydrotherapy.  Recommendation for LTAC,   Acute on chronic pain: Follows with Dr. RamNelva Bushain management.  Has received steroid injections to the back which, we've confirmed, will be delayed until completion of IV antibiotics. - Reviewed [place] PDMP and patient regularly fills opioid prescriptions. Likely opioid-dependent. - Obviously has reasons for acute pain from recent surgery, discitis/osteomyelitis. Opioid tolerance PTA and may be increasing here. Bowel regimen also ordered. - Continue multimodal pain control as ordered: - Tylenol 1 g 3 times daily. - Oxy IR 10 mg every 4 hourly as needed for moderate pain - OxyContin 15 Mg every 12 hours - Lidocaine patch increased to 2 patches - Dilaudid 0.5 mg IV every 4 hourly as needed severe pain. Discussed need to limit/discontinue this, though prior to hydrotherapy will remain available. - Gabapentin 300 mg 3 times daily   Insomnia - Continue trazodone 100m15mS  Liver nodularity: Noted incidentally on CT chest 04/09/2021. Hypoalbuminemia noted. No ascites or LE edema, or signs of hepatic encephalopathy. Ferritin wnl. - INR was elevated to 1.4 when checked 11/14.  Pulmonary nodules: Unchanged on last CT chest 96m and smalled definitively benign, no further evaluation recommended.  Reported renal lesion: Per pt. Follows with Dr. HLouis Meckel  - Follow up with Dr. HLouis Meckelrecommended.   Pleural effusion: Chronic,  multifactorial partially related to CHF and to hypoalbuminemia. Not sufficient for thoracentesis.   RN Pressure Injury Documentation: Pressure Injury 07/27/21 Sacrum Mid Unstageable - Full thickness tissue loss in which the base of the injury is covered by slough (yellow, tan, gray, green or brown) and/or eschar (tan, brown or black) in the wound bed. small pink pressure ulcer with bre (Active)  07/27/21 0255  Location: Sacrum  Location Orientation: Mid  Staging: Unstageable - Full thickness tissue loss in which the base of the injury is covered by slough (yellow, tan, gray, green or brown) and/or eschar (tan, brown or black) in the wound bed.  Wound Description (Comments): small pink pressure ulcer with break in skin 08/12/21; updated to unstageable pressure injury  Present on Admission: Yes   DVT prophylaxis: Eliquis Code Status: Full Family Communication: None at bedside. Disposition Plan:  Status is: Inpatient  Remains inpatient appropriate because: Patient is appropriate candidate for LTAC, but he was denied by peer to peer today.  Consultants:  ID Neurosurgery PCCM Cardiology  Procedures:  Left TTA 07/23/2021 TEE/DCCV 07/29/2021 PICC RUE 08/13/2021  Antimicrobials: Cefazolin 11/16 Ceftriaxone 11/14, 11/16-17 Clindamycin 11/15-16 Linezolid 11/15 Pen G 11/18 Pip-tazo 11/15-11/16 Vancomycin 11/14 Cefepime 12/8 - 12/12  Subjective:  Patient report he is tolerating more physical therapy, back pain is better controlled.  Able to tolerate sitting on the chair 1 hour yesterday, does report worsening insomnia.  Objective: Vitals:   08/22/21 2348 08/23/21 0259 08/23/21 0628 08/23/21 0729  BP: 134/72 133/72  (!) 155/77  Pulse:  (!) 59  77  Resp: _0 Temp:  97.7 F (36.5 C)  98.3 F (36.8 C)  TempSrc:  Oral  Oral  SpO2:  98%  95%  Weight:      Height:        Intake/Output Summary (Last 24 hours) at 08/23/2021 1057 Last data filed at 08/23/2021 0733 Gross  per 24 hour  Intake 1200 ml  Output 3050 ml  Net -1850 ml   Filed Weights   07/27/21 0252 07/29/21 0754 08/01/21 0459  Weight: 108.2 kg 108.2 kg 107 kg      Awake Alert, Oriented X 3, No new F.N deficits, Normal affect Symmetrical Chest wall movement, Good air movement bilaterally, CTAB RRR,No Gallops,Rubs or new Murmurs, No Parasternal Heave +ve B.Sounds, Abd Soft, No tenderness, No rebound - guarding or rigidity. No Cyanosis, Clubbing or edema, bilateral BKA.  Data Reviewed: I have personally reviewed following labs and imaging studies  CBC: Recent Labs  Lab 08/17/21 0700 08/20/21 0330  WBC 6.6 7.3  HGB 13.1 12.8*  HCT 40.9 39.8  MCV 91.5 90.7  PLT 304 2294  Basic Metabolic Panel: Recent Labs  Lab 08/17/21 0700 08/20/21 0330 08/21/21 0905  NA 133* 131* 136  K 4.3 3.8 4.1  CL 101 99 104  CO2 _1 GLUCOSE 268* 206* 242*  BUN 23* 27* 24*  CREATININE 1.20 1.33* 1.24  CALCIUM 8.4* 8.2* 8.5*   GFR: Estimated Creatinine Clearance: 89 mL/min (by C-G formula based on SCr of 1.24 mg/dL). Liver Function Tests: Recent Labs  Lab 08/17/21 0700 08/20/21 0330  AST 21 20  ALT 39 33  ALKPHOS 102 108  BILITOT 0.5 0.4  PROT 5.8*  5.8*  ALBUMIN 1.8* 1.9*   No results for input(s): LIPASE, AMYLASE in the last 168 hours. No results for input(s): AMMONIA in the last 168 hours. Coagulation Profile: No results for input(s): INR, PROTIME in the last 168 hours. Cardiac Enzymes: No results for input(s): CKTOTAL, CKMB, CKMBINDEX, TROPONINI in the last 168 hours. BNP (last 3 results) No results for input(s): PROBNP in the last 8760 hours. HbA1C: No results for input(s): HGBA1C in the last 72 hours. CBG: Recent Labs  Lab 08/22/21 0607 08/22/21 1210 08/22/21 1703 08/22/21 2150 08/23/21 0605  GLUCAP 150* 236* 152* 229* 253*   Lipid Profile: No results for input(s): CHOL, HDL, LDLCALC, TRIG, CHOLHDL, LDLDIRECT in the last 72 hours. Thyroid Function Tests: No  results for input(s): TSH, T4TOTAL, FREET4, T3FREE, THYROIDAB in the last 72 hours. Anemia Panel: No results for input(s): VITAMINB12, FOLATE, FERRITIN, TIBC, IRON, RETICCTPCT in the last 72 hours.  Urine analysis:    Component Value Date/Time   COLORURINE YELLOW 07/22/2021 0250   APPEARANCEUR CLOUDY (A) 07/22/2021 0250   LABSPEC 1.018 07/22/2021 0250   PHURINE 5.0 07/22/2021 0250   GLUCOSEU >=500 (A) 07/22/2021 0250   HGBUR SMALL (A) 07/22/2021 0250   BILIRUBINUR NEGATIVE 07/22/2021 0250   KETONESUR 5 (A) 07/22/2021 0250   PROTEINUR 30 (A) 07/22/2021 0250   UROBILINOGEN 0.2 09/13/2019 0906   NITRITE NEGATIVE 07/22/2021 0250   LEUKOCYTESUR TRACE (A) 07/22/2021 0250   No results found for this or any previous visit (from the past 240 hour(s)).    Radiology Studies: MR THORACIC SPINE W WO CONTRAST  Result Date: 08/22/2021 CLINICAL DATA:  Follow-up discitis and osteomyelitis and epidural abscess. EXAM: MRI THORACIC WITHOUT AND WITH CONTRAST TECHNIQUE: Multiplanar and multiecho pulse sequences of the thoracic spine were obtained without and with intravenous contrast. CONTRAST:  77m GADAVIST GADOBUTROL 1 MMOL/ML IV SOLN COMPARISON:  MRI thoracic spine 08/07/2021 FINDINGS: Alignment:  Normal Vertebrae: Discitis and osteomyelitis at T6-7 appears improved. There is decreased pus within the disc space. There is endplate erosion at TD7-8 There remains significant surrounding bone marrow edema and enhancement extending from T5 through T8. There is edema extending into the pedicle and posterior elements of T5 and T6 as noted previously. No new level of infection in the thoracic spine Cord: Cord evaluation limited by motion. Posterior epidural abscess has improved in the interval. The abscess is located in the posterior epidural space and appears smaller. There remains moderate spinal stenosis at T6-7 due to posterior epidural abscess as well as ventral epidural thickening. The cord is flattened due to  spinal stenosis, similar to the prior study. Previously noted abscess at T3-4 on the right has significantly improved. Paraspinal and other soft tissues: Small bilateral layering pleural effusions have improved in the interval. There is paraspinous soft tissue thickening T5 through T9 compatible with acute infection. Disc levels: Discitis and osteomyelitis at T6-7 as described above. Bone marrow edema and likely infection extends from T5-T8. Mild disc degeneration at other levels in the thoracic spine without significant spinal stenosis or interval change. IMPRESSION: 1. Interval improvement in discitis and osteomyelitis centered at T6-7. Bone marrow edema and enhancement extends into T5 and T8. Posterior epidural abscess centered at T6-7 has improved. There remains moderate spinal stenosis with deformity of the cord due to stenosis from the abscess. This is similar in degree to the prior study. 2. No interval fracture. No new area of infection in the thoracic spine. 3. Small bilateral pleural effusions have improved in the interval.  Electronically Signed   By: Franchot Gallo M.D.   On: 08/22/2021 12:30    Scheduled Meds:  acetaminophen  1,000 mg Oral TID   amiodarone  200 mg Oral Daily   apixaban  5 mg Oral BID   vitamin C  1,000 mg Oral Daily   Chlorhexidine Gluconate Cloth  6 each Topical Daily   collagenase   Topical Daily   docusate sodium  100 mg Oral Daily   finasteride  5 mg Oral Daily   gabapentin  300 mg Oral TID   insulin aspart  0-15 Units Subcutaneous TID WC   insulin aspart  0-5 Units Subcutaneous QHS   insulin aspart  6 Units Subcutaneous TID WC   insulin detemir  36 Units Subcutaneous BID   isosorbide mononitrate  30 mg Oral Daily   lidocaine  2 patch Transdermal Q24H   methocarbamol  1,000 mg Oral Q8H   metoprolol succinate  50 mg Oral Daily   multivitamin with minerals  1 tablet Oral Daily   nutrition supplement (JUVEN)  1 packet Oral BID BM   nystatin cream   Topical BID    oxyCODONE  15 mg Oral Q12H   polyethylene glycol  17 g Oral Daily   Ensure Max Protein  11 oz Oral BID BM   sodium chloride flush  10-40 mL Intracatheter Q12H   tamsulosin  0.4 mg Oral Daily   torsemide  20 mg Oral QODAY   Continuous Infusions:  sodium chloride Stopped (08/13/21 0457)   vancomycin 1,000 mg (08/23/21 0630)     LOS: 27 days    Phillips Climes, MD [facility] [email or web address] 08/23/2021, 10:57 AM

## 2021-08-23 NOTE — Progress Notes (Signed)
Physical Therapy Wound Treatment Patient Details  Name: [name] MRN: [id number] Date of Birth: 15-Sep-1965  Today's Date: 08/23/2021 Time: 1005-1037 Time Calculation (min): 32 min  Subjective  Subjective Assessment Subjective: Pt reporting intense sacral pain with sitting up in chair for 1 hour yesterday Patient and Family Stated Goals: Home to family Date of Onset:  (unknown)  Pain Score:  4/10 (sore; premedicated)  Wound Assessment  Pressure Injury 07/27/21 Sacrum Mid Unstageable - Full thickness tissue loss in which the base of the injury is covered by slough (yellow, tan, gray, green or brown) and/or eschar (tan, brown or black) in the wound bed. small pink pressure ulcer with bre (Active)  Dressing Type Barrier Film (skin prep);Foam - Lift dressing to assess site every shift;Gauze (Comment);Moist to dry;Santyl 08/23/21 1521  Dressing Changed;Clean;Dry;Intact 08/23/21 1521  Dressing Change Frequency Daily 08/23/21 1521  State of Healing Non-healing 08/23/21 1521  Site / Wound Assessment Yellow;Red;Brown 08/23/21 1521  % Wound base Red or Granulating 40% 08/22/21 1338  % Wound base Yellow/Fibrinous Exudate 50% 08/22/21 1338  % Wound base Black/Eschar 10% 08/22/21 1338  % Wound base Other/Granulation Tissue (Comment) 0% 08/22/21 1338  Peri-wound Assessment Intact;Purple 08/23/21 1521  Wound Length (cm) 7 cm 08/21/21 1300  Wound Width (cm) 6 cm 08/21/21 1300  Wound Depth (cm) 1.8 cm 08/21/21 1300  Wound Surface Area (cm^2) 42 cm^2 08/21/21 1300  Wound Volume (cm^3) 75.6 cm^3 08/21/21 1300  Undermining (cm) 1.7 cm from 11:00-1:00 08/21/21 1300  Margins Unattached edges (unapproximated) 08/23/21 1521  Drainage Amount Moderate 08/23/21 1521  Drainage Description Serosanguineous 08/23/21 1521  Treatment Debridement (Selective);Hydrotherapy (Pulse lavage);Packing (Saline gauze) 08/23/21 1521      Hydrotherapy Pulsed lavage therapy - wound location: sacral Pulsed Lavage with  Suction (psi): 8 psi (8-12) Pulsed Lavage with Suction - Normal Saline Used: 1000 mL Pulsed Lavage Tip: Tip with splash shield Selective Debridement Selective Debridement - Location: sacral Selective Debridement - Tools Used: Forceps, Scissors Selective Debridement - Tissue Removed: eschar    Wound Assessment and Plan  Wound Therapy - Assess/Plan/Recommendations Wound Therapy - Clinical Statement: Wound is making good strides with pulsatile lavage and sharp debridement. Still continues with moderate serosangineous drainage. Wound Therapy - Functional Problem List: Global weakness Factors Delaying/Impairing Wound Healing: Diabetes Mellitus, Incontinence, Infection - systemic/local, Immobility, Multiple medical problems Hydrotherapy Plan: Debridement, Dressing change, Patient/family education, Pulsatile lavage with suction Wound Therapy - Frequency: 6X / week Wound Therapy - Current Recommendations: PT Wound Therapy - Follow Up Recommendations: dressing changes by family/patient, dressing changes by RN  Wound Therapy Goals- Improve the function of patient's integumentary system by progressing the wound(s) through the phases of wound healing (inflammation - proliferation - remodeling) by: Wound Therapy Goals - Improve the function of patient's integumentary system by progressing the wound(s) through the phases of wound healing by: Decrease Necrotic Tissue to: 20% Decrease Necrotic Tissue - Progress: Progressing toward goal Increase Granulation Tissue to: 80% Increase Granulation Tissue - Progress: Progressing toward goal Goals/treatment plan/discharge plan were made with and agreed upon by patient/family: Yes Time For Goal Achievement: 7 days Wound Therapy - Potential for Goals: Good  Goals will be updated until maximal potential achieved or discharge criteria met.  Discharge criteria: when goals achieved, discharge from hospital, MD decision/surgical intervention, no progress towards  goals, refusal/missing three consecutive treatments without notification or medical reason.  GP     Charges PT Wound Care Charges $Wound Debridement up to 20 cm: < or equal to 20 cm $  Wound Debridement each add'l 20 sqcm: 1 $PT PLS Gun and Tip: 1 Supply $PT Hydrotherapy Visit: 1 Visit      Wyona Almas, PT, DPT Acute Rehabilitation Services Pager [phone] Office [phone]  Deno Etienne 08/23/2021, 3:25 PM

## 2021-08-24 LAB — GLUCOSE, CAPILLARY
Glucose-Capillary: 164 mg/dL — ABNORMAL HIGH (ref 70–99)
Glucose-Capillary: 276 mg/dL — ABNORMAL HIGH (ref 70–99)
Glucose-Capillary: 199 mg/dL — ABNORMAL HIGH (ref 70–99)
Glucose-Capillary: 196 mg/dL — ABNORMAL HIGH (ref 70–99)

## 2021-08-24 NOTE — Progress Notes (Addendum)
PROGRESS NOTE  [name]  XAJ:[id number] DOB: 1966-03-11 DOA: 07/21/2021 PCP: Patient, No Pcp Per (Inactive)   Brief Narrative:  55 year old male with history of diabetes mellitus type 2, diabetic foot infection with prior right BKA, chronic diastolic CHF, hypertension, dyslipidemia presented with worsening left foot swelling/discoloration and discharge.  On presentation, he was hypotensive, tachycardic with A. fib with RVR with CT suggestive of first MTP osteomyelitis and soft tissue gas concerning for possible necrotizing fasciitis along with AKI with creatinine of 2.7 and mild DKA.  His blood pressures did not improve with IV fluids and he was transferred to ICU and PCCM was consulted.  Orthopedics was consulted.  He underwent left transtibial amputation on 07/23/2021.  He was found to have strep agalactiae bacteremia.  ID was consulted.  He was found to have vertebral discitis versus osteomyelitis on MRI.  He was also found to have new systolic dysfunction with LVEF of 35% along with new onset atrial flutter requiring IV amiodarone which has been switched to oral.  Cardiology was consulted.  He has been started on Eliquis.  S/p TEE followed by DCCV on 11/22.  Developed worsening back pain 11/26-11/27, ID reconsulted, requested MRI T/L-spine, ultimately performed 12/1, with contrast which showed Significant progression of discitis and osteomyelitis at T5 through T8. There is edema extending into the posterior elements. Extensive posterior epidural abscess has progressed since the prior study. Abscess extends from T3 through T9 and is exerting mass-effect on the cord with moderate spinal stenosis T4 through T8.   Due to report of pleural effusions on MRI, thoracentesis was attempted but there was insufficient fluid volume.   Neurosurgery, Dr. Ronnald Ramp, has evaluated the patient recommends no surgery. ID has changed antibiotics to vancomycin with plans for protracted IV abx course thru PICC. He  continues to have PT regularly, getting hydrotherapy for sacral wound which appeared to have infection for which cefepime was added.   Assessment & Plan: Principal Problem:   Discitis of thoracic region Active Problems:   Essential hypertension   Stage 3a chronic kidney disease (HCC)   Type 2 diabetes mellitus with diabetic polyneuropathy, with long-term current use of insulin (HCC)   Right below-knee amputee (HCC)   Diabetic infection of left foot (HCC)   Chronic diastolic (congestive) heart failure (HCC)   Severe sepsis with acute organ dysfunction (HCC)   Chest pain   AKI (acute kidney injury) ([place])   Gas gangrene of foot (HCC)   Atrial flutter (HCC)   Septic shock (HCC)   Systolic dysfunction   Pressure injury of skin   MSSA bacteremia   Septic shock:  -Evolving on admission from bacteremia/diabetic foot infection  - Off pressors.   -Septic shock resolved   Diabetic left foot infection:  - Status post left transtibial amputation on 07/23/2021. - Wound/wound VAC DC'ed per Dr. Sharol Given, recommends dry dressing which has been applied.   -stapels were removed 12/16   Group B streptococcus bacteremia, progressive thoracic discitis/osteomyelitis.  - No vegetation on TTE or TEE during cardioversion.  MRI repeated with contrast on 12/1 showed Significant progression of discitis and osteomyelitis at T5 through T8 and extensive posterior epidural abscess has progressed since the prior study extending T3 through T9 exerting mass-effect on the cord with moderate spinal stenosis T4 through T8. - Neurosurgery consulted, continues to recommend IV antibiotics and advise against surgery in this difficult situation. Fortunately, there is clinical improvement with this strategy.  Repeat MRI 12/16 showing improvement in the osteomyelitis and discitis  and in the epidural abscess, patient remains clinically stable, neurosurgery has signed off, but will be available for any concern or questions,  patient need to follow-up with Dr. Ronnald Ramp about 3 weeks after discharge. -PICC line placed 12/7, continue with IV antibiotics via PICC, to finish total of 8 weeks of IV vancomycin per ID recommendations, cleared blood cultures on 07/24/2021 .  Sacral pressure wound:  -Infection noted 12/8.  Treated with total of 7 days of cefepime. - Started cefepime to include pseudomonas coverage, plan 7 days. - Continue offloading/turning, and getting OOB. - Continue topical nystatin -Continue with hydrotherapy  New onset systolic CHF: Echo showed EF of 35% (previous echo showed EF of 50 to 55% in 04/2021). Cardiomyopathy may be from new onset atrial flutter RVR related tachycardia versus stress induced cardiomyopathy from bacteremia and septic shock. - Cardiology no longer following, recommended torsemide 66m QOD for now. - Continue metop succinate, bidil. CrCl limited ACE/ARB/ARNI and MRA. - HF navigator consulted.  New onset atrial flutter: Maintained NSR since DCCV 11/22 - Continue metoprolol succinate 533mdaily. - Continue amiodarone 20078maily - Restarted eliquis since no procedural plans, preferably uninterrupted anticoagulation for 1 month post DCCV.  Acute urinary retention:  - Pt reports retention-type symptoms chronically and has repeatedly required I&O caths which are met with significant resistance. Strong suspicion for BPH. Pt also reports hx penis fracture not repaired surgically years ago. -Started on finasteride, tamsulosin, Foley catheter inserted 12/13, he will need voiding trial in few days , will need to follow with urology as an outpatient regardless.   -Will attempt voiding trial in 2 to 3 days.  T2DM uncontrolled with hyperglycemia:  - Continue basal-bolus insulin.  CBG better improved after increasing his Semglee to 36 units .continue with insulin sliding scale and scheduled Premeal NovoLog .  AKI on stage IIIa CKD: AKI resolving with limitation on diuresis. - Monitor  intermittently while on vancomycin. CrCl now improved.   Pt takes testosterone/anabolic steroids (not prescription):  - Free level checked 11/29, low at 4.0. Rechecked 12/5, borderline low at  8.8.    Hyponatremia: Improved.   Hypomagnesemia: Improved with supplementation.  - Follow periodically.   Leukocytosis: Resolved.   Thrombocytopenia: Resolved    Generalized deconditioning - PT recommends CIR vs. SNF. Pt making gains and working with PT -  Unable to DC to SNF while undergoing hydrotherapy.  Recommendation for LTAC,   Acute on chronic pain: Follows with Dr. RamNelva Bushain management.  Has received steroid injections to the back which, we've confirmed, will be delayed until completion of IV antibiotics. - Reviewed [place] PDMP and patient regularly fills opioid prescriptions. Likely opioid-dependent. - Obviously has reasons for acute pain from recent surgery, discitis/osteomyelitis. Opioid tolerance PTA and may be increasing here. Bowel regimen also ordered. - Continue multimodal pain control as ordered: - Tylenol 1 g 3 times daily. - Oxy IR 10 mg every 4 hourly as needed for moderate pain - OxyContin 15 Mg every 12 hours - Lidocaine patch increased to 2 patches - Dilaudid 0.5 mg IV every 4 hourly as needed severe pain. Discussed need to limit/discontinue this, though prior to hydrotherapy will remain available. - Gabapentin 300 mg 3 times daily   Insomnia -Trazodone was increased to 150 mg nightly as needed  Liver nodularity: Noted incidentally on CT chest 04/09/2021. Hypoalbuminemia noted. No ascites or LE edema, or signs of hepatic encephalopathy. Ferritin wnl. - INR was elevated to 1.4 when checked 11/14.   Pulmonary nodules: Unchanged on  last CT chest 84m and smalled definitively benign, no further evaluation recommended.  Reported renal lesion: Per pt. Follows with Dr. HLouis Meckel  - Follow up with Dr. HLouis Meckelrecommended.   Pleural effusion: Chronic, multifactorial partially  related to CHF and to hypoalbuminemia. Not sufficient for thoracentesis.   RN Pressure Injury Documentation: Pressure Injury 07/27/21 Sacrum Mid Unstageable - Full thickness tissue loss in which the base of the injury is covered by slough (yellow, tan, gray, green or brown) and/or eschar (tan, brown or black) in the wound bed. small pink pressure ulcer with bre (Active)  07/27/21 0255  Location: Sacrum  Location Orientation: Mid  Staging: Unstageable - Full thickness tissue loss in which the base of the injury is covered by slough (yellow, tan, gray, green or brown) and/or eschar (tan, brown or black) in the wound bed.  Wound Description (Comments): small pink pressure ulcer with break in skin 08/12/21; updated to unstageable pressure injury  Present on Admission: Yes   DVT prophylaxis: Eliquis Code Status: Full Family Communication: None at bedside. Disposition Plan:  Status is: Inpatient  Remains inpatient appropriate because: Patient is appropriate candidate for LTAC, but he was denied by peer to peer 12/16, written appeal was sent. Consultants:  ID Neurosurgery PCCM Cardiology  Procedures:  Left TTA 07/23/2021 TEE/DCCV 07/29/2021 PICC RUE 08/13/2021  Antimicrobials: Cefazolin 11/16 Ceftriaxone 11/14, 11/16-17 Clindamycin 11/15-16 Linezolid 11/15 Pen G 11/18 Pip-tazo 11/15-11/16 Vancomycin 11/14 Cefepime 12/8 - 12/12  Subjective:  He does report some insomnia overnight, good BM, no nausea, no vomiting.    Objective: Vitals:   08/23/21 1945 08/23/21 2300 08/24/21 0431 08/24/21 0823  BP: 138/67 (!) 148/68 136/83 140/79  Pulse: 60 61 60 70  Resp: _0 Temp: 98.5 F (36.9 C) 97.8 F (36.6 C) (!) 97.5 F (36.4 C) 97.8 F (36.6 C)  TempSrc: Oral Oral Oral Oral  SpO2: 98% 95% 95% 90%  Weight:      Height:        Intake/Output Summary (Last 24 hours) at 08/24/2021 1039 Last data filed at 08/24/2021 0836 Gross per 24 hour  Intake 1040 ml  Output 4975 ml   Net -3935 ml   Filed Weights   07/27/21 0252 07/29/21 0754 08/01/21 0459  Weight: 108.2 kg 108.2 kg 107 kg      Awake Alert, Oriented X 3, No new F.N deficits, Normal affect Symmetrical Chest wall movement, Good air movement bilaterally, CTAB RRR,No Gallops,Rubs or new Murmurs, No Parasternal Heave +ve B.Sounds, Abd Soft, No tenderness, No rebound - guarding or rigidity. No Cyanosis, Clubbing or edema,B/L BKA   Data Reviewed: I have personally reviewed following labs and imaging studies  CBC: Recent Labs  Lab 08/20/21 0330  WBC 7.3  HGB 12.8*  HCT 39.8  MCV 90.7  PLT 2177  Basic Metabolic Panel: Recent Labs  Lab 08/20/21 0330 08/21/21 0905  NA 131* 136  K 3.8 4.1  CL 99 104  CO2 26 27  GLUCOSE 206* 242*  BUN 27* 24*  CREATININE 1.33* 1.24  CALCIUM 8.2* 8.5*   GFR: Estimated Creatinine Clearance: 89 mL/min (by C-G formula based on SCr of 1.24 mg/dL). Liver Function Tests: Recent Labs  Lab 08/20/21 0330  AST 20  ALT 33  ALKPHOS 108  BILITOT 0.4  PROT 5.8*  ALBUMIN 1.9*   No results for input(s): LIPASE, AMYLASE in the last 168 hours. No results for input(s): AMMONIA in the last 168 hours. Coagulation Profile: No results for input(s):  INR, PROTIME in the last 168 hours. Cardiac Enzymes: No results for input(s): CKTOTAL, CKMB, CKMBINDEX, TROPONINI in the last 168 hours. BNP (last 3 results) No results for input(s): PROBNP in the last 8760 hours. HbA1C: No results for input(s): HGBA1C in the last 72 hours. CBG: Recent Labs  Lab 08/23/21 1112 08/23/21 1600 08/23/21 2145 08/23/21 2259 08/24/21 0633  GLUCAP 245* 83 194* 249* 164*   Lipid Profile: No results for input(s): CHOL, HDL, LDLCALC, TRIG, CHOLHDL, LDLDIRECT in the last 72 hours. Thyroid Function Tests: No results for input(s): TSH, T4TOTAL, FREET4, T3FREE, THYROIDAB in the last 72 hours. Anemia Panel: No results for input(s): VITAMINB12, FOLATE, FERRITIN, TIBC, IRON, RETICCTPCT in the  last 72 hours.  Urine analysis:    Component Value Date/Time   COLORURINE YELLOW 07/22/2021 0250   APPEARANCEUR CLOUDY (A) 07/22/2021 0250   LABSPEC 1.018 07/22/2021 0250   PHURINE 5.0 07/22/2021 0250   GLUCOSEU >=500 (A) 07/22/2021 0250   HGBUR SMALL (A) 07/22/2021 0250   BILIRUBINUR NEGATIVE 07/22/2021 0250   KETONESUR 5 (A) 07/22/2021 0250   PROTEINUR 30 (A) 07/22/2021 0250   UROBILINOGEN 0.2 09/13/2019 0906   NITRITE NEGATIVE 07/22/2021 0250   LEUKOCYTESUR TRACE (A) 07/22/2021 0250   No results found for this or any previous visit (from the past 240 hour(s)).    Radiology Studies: No results found.  Scheduled Meds:  acetaminophen  1,000 mg Oral TID   amiodarone  200 mg Oral Daily   apixaban  5 mg Oral BID   vitamin C  1,000 mg Oral Daily   Chlorhexidine Gluconate Cloth  6 each Topical Daily   collagenase   Topical Daily   docusate sodium  100 mg Oral Daily   finasteride  5 mg Oral Daily   gabapentin  300 mg Oral TID   insulin aspart  0-15 Units Subcutaneous TID WC   insulin aspart  0-5 Units Subcutaneous QHS   insulin aspart  6 Units Subcutaneous TID WC   insulin detemir  36 Units Subcutaneous BID   isosorbide mononitrate  30 mg Oral Daily   lidocaine  2 patch Transdermal Q24H   methocarbamol  1,000 mg Oral Q8H   metoprolol succinate  50 mg Oral Daily   multivitamin with minerals  1 tablet Oral Daily   nutrition supplement (JUVEN)  1 packet Oral BID BM   nystatin cream   Topical BID   oxyCODONE  15 mg Oral Q12H   polyethylene glycol  17 g Oral Daily   Ensure Max Protein  11 oz Oral BID BM   sodium chloride flush  10-40 mL Intracatheter Q12H   tamsulosin  0.4 mg Oral Daily   testosterone  5 g Transdermal Daily   torsemide  20 mg Oral QODAY   Continuous Infusions:  sodium chloride Stopped (08/13/21 0457)   vancomycin 1,000 mg (08/24/21 0504)     LOS: 77 days    Phillips Climes, MD [facility] [email or web address] 08/24/2021, 10:39 AM

## 2021-08-24 NOTE — Progress Notes (Signed)
Pharmacy Antibiotic Note  [name] is a 55 y.o. male admitted on 07/21/2021 with GBS bacteremia related to L-DFI now s/p trans-tib amputation with progressive discitis/osteo on Vancomycin per patient preference.   Cefepime started 12/8 for worsening sacral pressure wound. Completed 7 days of cefepime as of 08/21/21>  cefepime discontinued 12/15 per ID.  SCr stable 1.24 at time of levels.  Vancomycin peak = 26 mcg/ml Vancomycin trough = 18 mcg/ml on vancomycin 750 mg q8 hr.  Calculated AUC = 570, is above goal AUC 400-550.  Vancomycin dose adjusted as below. No Scr since then.  Total duration need 8 weeks of IV vanc per Dr. Waldron Labs as discussed with  ID PICC placed 12/7  Disp: SNF   Plan: -Continue Vancomycin to 1000mg  q12h (estAUC 506, Cmax 30.6, Cmin 13.5)  Plan to continue Vancomcyin x 8 weeks per ID recommendation. - Cefepime discontinued per MD, completed 7 days. - Will continue to follow renal function, culture results, LOT.  Monitor vancomycin trough weekly. Next weekly VT due 12/22 check Scr at that time, BMET ordered.  Height: 6\' 3"  (190.5 cm) Weight: 107 kg (235 lb 14.3 oz) IBW/kg (Calculated) : 84.5  Temp (24hrs), Avg:97.9 F (36.6 C), Min:97.5 F (36.4 C), Max:98.5 F (36.9 C)  Recent Labs  Lab 08/20/21 0330 08/21/21 0415 08/21/21 0905 08/21/21 1358  WBC 7.3  --   --   --   CREATININE 1.33*  --  1.24  --   VANCOTROUGH  --  22*  --  18  VANCOPEAK  --   --  26*  --      Estimated Creatinine Clearance: 89 mL/min (by C-G formula based on SCr of 1.24 mg/dL).    Allergies  Allergen Reactions   Bactrim [Sulfamethoxazole-Trimethoprim]    Ceprotin [Protein C Concentrate (Human)]    Ciprofloxacin Other (See Comments)    Kidney function   Levaquin [Levofloxacin]    11/14 Ceftriaxone x1 11/14 Vancomycin >> 11/15, 12/1>>  (plan x 8 weeks) 12/1 Cefepime>>12/5  ; 12/8 > 12/15 11/15 Zosyn >> 11/16 11/15 Clindamycin >>11/15 11/15 Linezolid>> 11/16 11/16  Rocephin>>11/18 11/18 Penicillin G>>12/1 by ID (2 weeks IV therapy, then 4-6 weeks amox until 09/14/21) 12/2 acyclovir for 7 days > 12/9  Vanc levels and antibiotic dose adjustments: 12/3 VP = 24; VT = 12 - AUC = 484 on 750mg  q8 12/9 VT 14>>continue 750 q8hr 12/15 VT = 22 @ 6433 (early) ? If accurate. Will redraw 12/15 VP = 26 @ 0905  (2 hr post end of  infus/dose given 0559 750mg  q8h) Scr 1.24 12/15 VT = 18 @ 1358 (calc AUC 570,, SCr 1.24) > adjusted dose to 1000 mg q12h , estAUC 506, Cmax 30.6, Cmin 13.5)   11/17 BCx: ngF 11/16 MRS PCR + 11/14 BCx: GBS pan S 11/15 UCx: GBS   Thank you for allowing pharmacy to be a part of this patients care.  Cathrine Muster, PharmD PGY2 Cardiology Pharmacy Resident Phone: [phone] 08/24/2021  3:33 PM  Please check [email or web address] for unit-specific pharmacy phone numbers.

## 2021-08-25 ENCOUNTER — Ambulatory Visit: Payer: Medicare HMO | Admitting: Internal Medicine

## 2021-08-25 LAB — BASIC METABOLIC PANEL
Anion gap: 8 (ref 5–15)
BUN: 18 mg/dL (ref 6–20)
CO2: 25 mmol/L (ref 22–32)
Calcium: 8.7 mg/dL — ABNORMAL LOW (ref 8.9–10.3)
Chloride: 102 mmol/L (ref 98–111)
Creatinine, Ser: 1.03 mg/dL (ref 0.61–1.24)
GFR, Estimated: >60 mL/min (ref >60)
Glucose, Bld: 159 mg/dL — ABNORMAL HIGH (ref 70–99)
Potassium: 4 mmol/L (ref 3.5–5.1)
Sodium: 135 mmol/L (ref 135–145)

## 2021-08-25 LAB — GLUCOSE, CAPILLARY
Glucose-Capillary: 160 mg/dL — ABNORMAL HIGH (ref 70–99)
Glucose-Capillary: 260 mg/dL — ABNORMAL HIGH (ref 70–99)
Glucose-Capillary: 134 mg/dL — ABNORMAL HIGH (ref 70–99)
Glucose-Capillary: 153 mg/dL — ABNORMAL HIGH (ref 70–99)

## 2021-08-25 LAB — CBC
HCT: 44.3 % (ref 39.0–52.0)
Hemoglobin: 14.2 g/dL (ref 13.0–17.0)
MCH: 30 pg (ref 26.0–34.0)
MCHC: 32.1 g/dL (ref 30.0–36.0)
MCV: 93.7 fL (ref 80.0–100.0)
Platelets: 151 10*3/uL (ref 150–400)
RBC: 4.73 MIL/uL (ref 4.22–5.81)
RDW: 15.1 % (ref 11.5–15.5)
WBC: 6.6 10*3/uL (ref 4.0–10.5)
nRBC: 0 % (ref 0.0–0.2)

## 2021-08-25 MED ORDER — DAKINS (1/4 STRENGTH) 0.125 % EX SOLN
Freq: Every day | CUTANEOUS | Status: AC
  Filled 2021-08-25: qty 473

## 2021-08-25 MED ORDER — SILVER NITRATE-POT NITRATE 75-25 % EX MISC
1.0000 "application " | Freq: Once | CUTANEOUS | Status: DC
  Filled 2021-08-25: qty 1

## 2021-08-25 MED ORDER — COLLAGENASE 250 UNIT/GM EX OINT
TOPICAL_OINTMENT | Freq: Every day | CUTANEOUS | Status: DC
  Filled 2021-08-25 (×2): qty 30

## 2021-08-25 NOTE — Progress Notes (Signed)
Explained that there is an order to remove the foley cath and claimed to have it done later and asked for primo fit to be applied.

## 2021-08-25 NOTE — Progress Notes (Signed)
Been refusing lidoderm patch claiming not helping. Md made aware with order to d/c med.

## 2021-08-25 NOTE — TOC Progression Note (Addendum)
Transition of Care [facility]) - Progression Note    Patient Details  Name: [name] MRN: [id number] Date of Birth: 04-Aug-1966  [facility] CM/SW Contact  Bartholomew Crews, RN Phone Number: [phone] 08/25/2021, 4:26 PM  Clinical Narrative:     Received call from Jenn at [facility] - appeal for transition was denied by [facility] - no reason given. Spoke with patient at the bedside - patient had already been notified by [facility] of denial. Patient declines need to go to SNF stating SNF cannot give him the care that he needs for wound care and therapy. Patient stated that he has been improving day by day and is asking about inpatient rehab.   Update 1720: requested consult for inpatient rehab for reconsideration. Referral sent to [facility] and [facility]. Patient aware and agreeable.   Expected Discharge Plan: [place] Barriers to Discharge: Continued Medical Work up  Expected Discharge Plan and Services Expected Discharge Plan: [place] In-house Referral: Clinical Social Work Discharge Planning Services: CM Consult, Follow-up appt scheduled Post Acute Care Choice: [place] arrangements for the past 2 months: Apartment                                       Social Determinants of Health (SDOH) Interventions    Readmission Risk Interventions No flowsheet data found.

## 2021-08-25 NOTE — Progress Notes (Signed)
Physical Therapy Treatment Patient Details Name: [name] MRN: [id number] DOB: 1966-04-10 Today's Date: 08/25/2021   History of Present Illness Pt is a 55 y.o. 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 S/p TEE, DCCV on 11/22. Chest CT 12/4 with pleural effusion; thoracentesis attempted with insufficient fluid volume. PMH includes R BKA, aflutter, DM, HTN, depression, anxiety.    PT Comments    Worked on balance and strengthening to facilitate ability to transfer. Working on obtaining a better cushion for the chair so that pt can tolerate sitting up. Hopeful that with the shrinker in place on his RLE that he will be able to get his prosthetic on  which would be helpful for transfers and for pt to be able to lift his buttocks during lateral transfer.    Recommendations for follow up therapy are one component of a multi-disciplinary discharge planning process, led by the attending physician.  Recommendations may be updated based on patient status, additional functional criteria and insurance authorization.  Follow Up Recommendations  Acute inpatient rehab (3hours/day)     Assistance Recommended at Discharge Frequent or constant Supervision/Assistance  Equipment Recommendations  Wheelchair (measurements PT);Wheelchair cushion (measurements PT);Hospital bed    Recommendations for Other Services       Precautions / Restrictions Precautions Precautions: Fall;Back Precaution Booklet Issued: No Precaution Comments: Back precautions for comfort Required Braces or Orthoses: Other Brace Other Brace: LLE limb guard; has RLE prosthetic in room (not fitting currently per pt?) Restrictions Weight Bearing Restrictions: Yes LLE Weight Bearing: Non weight bearing      Mobility  Bed Mobility Overal bed mobility: Needs Assistance Bed Mobility: Supine to Sit;Sit to Supine     Supine to sit: Mod assist;+2 for physical assistance Sit to supine: Min assist   General bed mobility comments: Pt using therapist hand to pull trunk up into sitting. Returning to supine pt with incr time but only required assist to manage line and for safety    Transfers                   General transfer comment: patient did not want to perform transfer into recliner again until we have gel cushion due to painful wound.  Currently attempting to obtain    Ambulation/Gait                   Stairs             Wheelchair Mobility    Modified Rankin (Stroke Patients Only)       Balance Overall balance assessment: Needs assistance Sitting-balance support: Single extremity supported;Bilateral upper extremity supported Sitting balance-Leahy Scale: Poor Sitting balance - Comments: able to maintain sitting balance with one to two extremity support. Sat EOB x 20 minutes for strengtening and balance activities.                                    Cognition Arousal/Alertness: Awake/alert Behavior During Therapy: WFL for tasks assessed/performed Overall Cognitive Status: No family/caregiver present to determine baseline cognitive functioning Area of Impairment: Attention;Safety/judgement;Awareness;Problem solving                   Current Attention Level: Selective     Safety/Judgement: Decreased awareness of  deficits Awareness: Emergent Problem Solving: Requires verbal cues;Difficulty sequencing General Comments: pleasant mood, pleased with progress        Exercises Other Exercises Other Exercises: UE reaching exercises to increase sititng balance for 2 sets of 10 for each extremity Other Exercises: lateral lean to propped elbow and return to upright on each side x 10 reps Other Exercises: trunk flexion and extension  with use of bedside table 2 x 10 reps with focus on getting shoulders and head in front of hips for preparation for transfers Other Exercises: Scapular retraction 2x10 reps in sitting Other Exercises: Anterior pelvic tilt with trunk extension in sitting 2 x 10 reps    General Comments        Pertinent Vitals/Pain Pain Assessment: Faces Faces Pain Scale: Hurts even more Pain Location: back and wound site Pain Descriptors / Indicators: Grimacing;Guarding;Moaning Pain Intervention(s): Premedicated before session;Monitored during session;Repositioned    Home Living                          Prior Function            PT Goals (current goals can now be found in the care plan section) Progress towards PT goals: Progressing toward goals    Frequency    Min 3X/week      PT Plan Current plan remains appropriate    Co-evaluation PT/OT/SLP Co-Evaluation/Treatment: Yes Reason for Co-Treatment: For patient/therapist safety PT goals addressed during session: Mobility/safety with mobility;Balance;Strengthening/ROM OT goals addressed during session: Strengthening/ROM      AM-PAC PT "6 Clicks" Mobility   Outcome Measure  Help needed turning from your back to your side while in a flat bed without using bedrails?: A Little Help needed moving from lying on your back to sitting on the side of a flat bed without using bedrails?: Total Help needed moving to and from a bed to a chair (including a wheelchair)?: Total Help needed standing up from a chair using your arms (e.g., wheelchair or bedside chair)?: Total Help needed to walk in hospital room?: Total Help needed climbing 3-5 steps with a railing? : Total 6 Click Score: 8    End of Session   Activity Tolerance: Patient tolerated treatment well Patient left: in bed;with call bell/phone within reach   PT Visit Diagnosis: Other abnormalities of gait and mobility (R26.89);Muscle weakness (generalized) (M62.81);Pain Pain -  part of body:  (back and buttocks)     Time: 1610-9604 PT Time Calculation (min) (ACUTE ONLY): 30 min  Charges:  $Therapeutic Activity: 8-22 mins                     [place] Pager [phone] Office [place] 08/25/2021, 5:04 PM

## 2021-08-25 NOTE — Progress Notes (Signed)
Physical Therapy Wound Treatment Patient Details  Name: [name] MRN: [id number] Date of Birth: 06-Mar-1966  Today's Date: 08/25/2021 Time: 1020-1111 Time Calculation (min): 51 min  Subjective  Subjective Assessment Subjective: Pt reporting intense sacral pain with sitting up in chair for 1 hour yesterday Patient and Family Stated Goals: Home to family Date of Onset:  (unknown) Prior Treatments:  (unknown)  Pain Score:  6/10 premedicated  Wound Assessment  Pressure Injury 07/27/21 Sacrum Mid Unstageable - Full thickness tissue loss in which the base of the injury is covered by slough (yellow, tan, gray, green or brown) and/or eschar (tan, brown or black) in the wound bed. small pink pressure ulcer with bre (Active)  Wound Image   08/21/21 1300  Dressing Type ABD;Barrier Film (skin prep);Dakin's-soaked gauze;Gauze (Comment);Moist to dry 08/25/21 1126  Dressing Changed;Clean;Dry;Intact 08/25/21 1126  Dressing Change Frequency Daily 08/25/21 1126  State of Healing Eschar 08/25/21 1126  Site / Wound Assessment Red;Yellow;Brown 08/25/21 1126  % Wound base Red or Granulating 40% 08/25/21 1126  % Wound base Yellow/Fibrinous Exudate 50% 08/25/21 1126  % Wound base Black/Eschar 10% 08/25/21 1126  % Wound base Other/Granulation Tissue (Comment) 0% 08/25/21 1126  Peri-wound Assessment Erythema (blanchable);Other (Comment) 08/25/21 1126  Wound Length (cm) 7 cm 08/21/21 1300  Wound Width (cm) 6 cm 08/21/21 1300  Wound Depth (cm) 1.8 cm 08/21/21 1300  Wound Surface Area (cm^2) 42 cm^2 08/21/21 1300  Wound Volume (cm^3) 75.6 cm^3 08/21/21 1300  Undermining (cm) 1.7 cm from 11:00-1:00 08/21/21 1300  Margins Unattached edges (unapproximated) 08/25/21 1126  Drainage Amount Copious 08/25/21 1126  Drainage Description Green;Other (Comment) 08/25/21 1126  Treatment Cleansed;Debridement (Selective);Hydrotherapy (Pulse lavage);Other (Comment) 08/25/21 1126      Hydrotherapy Pulsed lavage  therapy - wound location: sacral Pulsed Lavage with Suction (psi): 8 psi (8-12) Pulsed Lavage with Suction - Normal Saline Used: 1000 mL Pulsed Lavage Tip: Tip with splash shield Selective Debridement Selective Debridement - Location: sacral Selective Debridement - Tools Used: Forceps, Scissors Selective Debridement - Tissue Removed: eschar    Wound Assessment and Plan  Wound Therapy - Assess/Plan/Recommendations Wound Therapy - Clinical Statement: Wound is making good strides with pulsatile lavage and sharp debridement. Still continues with moderate serosangineous drainage. Wound Therapy - Functional Problem List: Global weakness Factors Delaying/Impairing Wound Healing: Diabetes Mellitus, Incontinence, Infection - systemic/local, Immobility, Multiple medical problems Hydrotherapy Plan: Debridement, Dressing change, Patient/family education, Pulsatile lavage with suction Wound Therapy - Frequency: 6X / week Wound Therapy - Current Recommendations: PT Wound Therapy - Follow Up Recommendations: dressing changes by family/patient, dressing changes by RN  Wound Therapy Goals- Improve the function of patient's integumentary system by progressing the wound(s) through the phases of wound healing (inflammation - proliferation - remodeling) by: Wound Therapy Goals - Improve the function of patient's integumentary system by progressing the wound(s) through the phases of wound healing by: Decrease Necrotic Tissue to: 20% Decrease Necrotic Tissue - Progress: Progressing toward goal Increase Granulation Tissue to: 80% Increase Granulation Tissue - Progress: Progressing toward goal Goals/treatment plan/discharge plan were made with and agreed upon by patient/family: Yes Time For Goal Achievement: 7 days Wound Therapy - Potential for Goals: Good  Goals will be updated until maximal potential achieved or discharge criteria met.  Discharge criteria: when goals achieved, discharge from hospital, MD  decision/surgical intervention, no progress towards goals, refusal/missing three consecutive treatments without notification or medical reason.  GP     Charges PT Wound Care Charges $Wound Debridement up to 20 cm: <  or equal to 20 cm $ Wound Debridement each add'l 20 sqcm: 1 $PT Hydrotherapy Dressing: 1 dressing $PT PLS Gun and Tip: 1 Supply $PT Hydrotherapy Visit: 1 Visit       [name] Darshay Deupree 08/25/2021, 11:29 AM 08/25/2021  [name]., PT Acute Rehabilitation Services [phone]  (pager) [phone]  (office)

## 2021-08-25 NOTE — Progress Notes (Signed)
[facility]    Date of Admission:  07/21/2021   Total days of antibiotics -35          ID: [name] is a 55 y.o. male with group b strep bacteremia secondary to left BKA c/b thoracic epidural abscess currently on vancomycin. He has also developed sacral ulcer while hospitalized  Principal Problem:   Discitis of thoracic region Active Problems:   Essential hypertension   Stage 3a chronic kidney disease (HCC)   Type 2 diabetes mellitus with diabetic polyneuropathy, with long-term current use of insulin (HCC)   Right below-knee amputee ([place])   Diabetic infection of left foot (HCC)   Chronic diastolic (congestive) heart failure (HCC)   Severe sepsis with acute organ dysfunction (HCC)   Chest pain   AKI (acute kidney injury) ([place])   Gas gangrene of foot (HCC)   Atrial flutter (HCC)   Septic shock (HCC)   Systolic dysfunction   Pressure injury of skin   MSSA bacteremia    Subjective: Afebrile, no leukocytosis. PT noticing worsening drainage today, "greenish exudate", cefepime discontinued 4 days ago.   Medications:   acetaminophen  1,000 mg Oral TID   amiodarone  200 mg Oral Daily   apixaban  5 mg Oral BID   vitamin C  1,000 mg Oral Daily   Chlorhexidine Gluconate Cloth  6 each Topical Daily   collagenase   Topical Daily   [START ON 09/03/2021] collagenase   Topical Daily   docusate sodium  100 mg Oral Daily   finasteride  5 mg Oral Daily   gabapentin  300 mg Oral TID   insulin aspart  0-15 Units Subcutaneous TID WC   insulin aspart  0-5 Units Subcutaneous QHS   insulin aspart  6 Units Subcutaneous TID WC   insulin detemir  36 Units Subcutaneous BID   isosorbide mononitrate  30 mg Oral Daily   lidocaine  2 patch Transdermal Q24H   methocarbamol  1,000 mg Oral Q8H   metoprolol succinate  50 mg Oral Daily   multivitamin with minerals  1 tablet Oral Daily   nystatin cream   Topical BID   oxyCODONE  15 mg Oral Q12H   Ensure Max Protein   11 oz Oral BID BM   silver nitrate applicators  1 application Topical Once   sodium chloride flush  10-40 mL Intracatheter Q12H   sodium hypochlorite   Irrigation Daily   tamsulosin  0.4 mg Oral Daily   testosterone  5 g Transdermal Daily   torsemide  20 mg Oral QODAY    Objective: Vital signs in last 24 hours: Temp:  [97.7 F (36.5 C)-98 F (36.7 C)] 98 F (36.7 C) (12/19 1114) Pulse Rate:  [60-63] 63 (12/18 2304) Resp:  [19-20] 19 (12/19 1114) BP: (132-156)/(67-82) 132/69 (12/19 1114) SpO2:  [96 %-98 %] 97 % (12/19 1600)  Did not examine  Lab Results Recent Labs    08/25/21 0830  WBC 6.6  HGB 14.2  HCT 44.3  NA 135  K 4.0  CL 102  CO2 25  BUN 18  CREATININE 1.03   Liver Panel No results for input(s): PROT, ALBUMIN, AST, ALT, ALKPHOS, BILITOT, BILIDIR, IBILI in the last 72 hours. Sedimentation Rate No results for input(s): ESRSEDRATE in the last 72 hours. C-Reactive Protein No results for input(s): CRP in the last 72 hours.  Microbiology:  Studies/Results: No results found.   Assessment/Plan: Sacral wound = continue with dakin's and santyl  chemical debridement. Currently has 80% granulation tissue. Recommend to get picture for tomorrow to evaluate from last week. Will check sed rate and crp. If worsening tomorrow, will discuss increasing coverage to include GNR and anaerobes  Group b strep bacteremia with epidural abscess= patient was initially on penicillin but now wanted vancomycin, which he is tolerating. Likely treating mrsa colonization involvement in sacral wound  Thoracic epidural abscess= recommend repeat mri imaging towards end of 6 wk of iv therapy. Currently day 35.  Mobility = improving  Bka = appears healing well per dr duda  [facility] Pager: [phone]  08/25/2021, 4:39 PM

## 2021-08-25 NOTE — Consult Note (Signed)
[facility] Nurse wound follow up Expired orders updated to provide Dakin's and Santyl to the sacral wound:  Moisten Kerlix with Dakin's solution, insert into the wound, followed by dry gauze, cover with ABD pads and secure with Medipore tape. Once this order has expired then we will follow with Santyl applied in a nickel thick layer, moistened saline gauze, dry gauze and covered with ABD, secured with Medipore tape. Hydrotherapy is performed M-Sa. DRESSING CHANGES ON Sunday BY BEDSIDE RN.  WOC will follow up weekly.  Cathlean Marseilles Tamala Julian, MSN, RN, [place], Lysle Pearl, [facility] Wound Treatment Associate Pager [phone]

## 2021-08-25 NOTE — Progress Notes (Signed)
PROGRESS NOTE  [name]  KDX:[id number] DOB: [birth year] DOA: 07/21/2021 PCP: Patient, No Pcp Per (Inactive)   Brief Narrative:  55 year old male with history of diabetes mellitus type 2, diabetic foot infection with prior right BKA, chronic diastolic CHF, hypertension, dyslipidemia presented with worsening left foot swelling/discoloration and discharge.  On presentation, he was hypotensive, tachycardic with A. fib with RVR with CT suggestive of first MTP osteomyelitis and soft tissue gas concerning for possible necrotizing fasciitis along with AKI with creatinine of 2.7 and mild DKA.  His blood pressures did not improve with IV fluids and he was transferred to ICU and PCCM was consulted.  Orthopedics was consulted.  He underwent left transtibial amputation on 07/23/2021.  He was found to have strep agalactiae bacteremia.  ID was consulted.  He was found to have vertebral discitis versus osteomyelitis on MRI.  He was also found to have new systolic dysfunction with LVEF of 35% along with new onset atrial flutter requiring IV amiodarone which has been switched to oral.  Cardiology was consulted.  He has been started on Eliquis.  S/p TEE followed by DCCV on 11/22.  Developed worsening back pain 11/26-11/27, ID reconsulted, requested MRI T/L-spine, ultimately performed 12/1, with contrast which showed Significant progression of discitis and osteomyelitis at T5 through T8. There is edema extending into the posterior elements. Extensive posterior epidural abscess has progressed since the prior study. Abscess extends from T3 through T9 and is exerting mass-effect on the cord with moderate spinal stenosis T4 through T8.   Due to report of pleural effusions on MRI, thoracentesis was attempted but there was insufficient fluid volume.   Neurosurgery, Dr. Ronnald Ramp, has evaluated the patient recommends no surgery. ID has changed antibiotics to vancomycin with plans for protracted IV abx course thru PICC. He  continues to have PT regularly, getting hydrotherapy for sacral wound which appeared to have infection for which cefepime was added.   Assessment & Plan: Principal Problem:   Discitis of thoracic region Active Problems:   Essential hypertension   Stage 3a chronic kidney disease (HCC)   Type 2 diabetes mellitus with diabetic polyneuropathy, with long-term current use of insulin (HCC)   Right below-knee amputee (HCC)   Diabetic infection of left foot (HCC)   Chronic diastolic (congestive) heart failure (HCC)   Severe sepsis with acute organ dysfunction (HCC)   Chest pain   AKI (acute kidney injury) ([place])   Gas gangrene of foot (HCC)   Atrial flutter (HCC)   Septic shock (HCC)   Systolic dysfunction   Pressure injury of skin   MSSA bacteremia   Septic shocK: -Evolving on admission from bacteremia/diabetic foot infection/thoracic discitis/abscess  - Off pressors.   -Septic shock resolved   Diabetic left foot infection:  - Status post left transtibial amputation on 07/23/2021. - Wound/wound VAC DC'ed per Dr. Sharol Given, recommends dry dressing which has been applied.   -stapels were removed 12/16 -PT/OT   Group B streptococcus bacteremia, progressive thoracic discitis/osteomyelitis.  - No vegetation on TTE or TEE during cardioversion.  MRI repeated with contrast on 12/1 showed Significant progression of discitis and osteomyelitis at T5 through T8 and extensive posterior epidural abscess has progressed since the prior study extending T3 through T9 exerting mass-effect on the cord with moderate spinal stenosis T4 through T8. - Neurosurgery consulted, continues to recommend IV antibiotics and advise against surgery in this difficult situation. Fortunately, there is clinical improvement with this strategy.  Repeat MRI 12/16 showing improvement in the osteomyelitis and discitis  and in the epidural abscess, patient remains clinically stable, neurosurgery has signed off, but will be available for  any concern or questions, patient need to follow-up with Dr. Ronnald Ramp about 3 weeks after discharge. -PICC line placed 12/7, continue with IV antibiotics via PICC, to finish total of 8 weeks of IV vancomycin per ID recommendations, cleared blood cultures on 07/24/2021 . -Follow-up with neurosurgery Dr Sherley Bounds within 3 weeks after discharge  Sacral pressure wound:  -Infection noted 12/8.  Treated with total of 7 days of cefepime. - Continue offloading/turning, and getting OOB. - Continue topical nystatin -Continue with hydrotherapy  New onset systolic CHF: Echo showed EF of 35% (previous echo showed EF of 50 to 55% in 04/2021). Cardiomyopathy may be from new onset atrial flutter RVR related tachycardia versus stress induced cardiomyopathy from bacteremia and septic shock. - Cardiology no longer following, recommended torsemide 2m QOD for now. - Continue metop succinate, bidil. CrCl limited ACE/ARB/ARNI and MRA. - HF navigator consulted.  New onset atrial flutter: Maintained NSR since DCCV 11/22 - Continue metoprolol succinate 570mdaily. - Continue amiodarone 20057maily - Restarted eliquis since no procedural plans, preferably uninterrupted anticoagulation for 1 month post DCCV.  Acute urinary retention:  - Pt reports retention-type symptoms chronically and has repeatedly required I&O caths which are met with significant resistance. Strong suspicion for BPH. Pt also reports hx penis fracture not repaired surgically years ago. -Started on finasteride, tamsulosin, Foley catheter inserted 12/13, he will need voiding trial in few days , will need to follow with urology as an outpatient regardless.   -Will attempt voiding trial today.  T2DM uncontrolled with hyperglycemia:  - Continue basal-bolus insulin.  CBG better improved after increasing his Semglee to 36 units .continue with insulin sliding scale and scheduled Premeal NovoLog .  AKI on stage IIIa CKD: AKI resolving with limitation on  diuresis. - Monitor intermittently while on vancomycin. CrCl now improved.   Pt takes testosterone/anabolic steroids (not prescription):  - Free level checked 11/29, low at 4.0. Rechecked 12/5, borderline low at  8.8.    Hyponatremia: Improved.   Hypomagnesemia: Improved with supplementation.  - Follow periodically.   Leukocytosis: Resolved.   Thrombocytopenia: Resolved    Generalized deconditioning - PT recommends CIR vs. SNF. Pt making gains and working with PT -  Unable to DC to SNF while undergoing hydrotherapy.  Recommendation for LTAC,   Acute on chronic pain: Follows with Dr. RamNelva Bushain management.  Has received steroid injections to the back which, we've confirmed, will be delayed until completion of IV antibiotics. - Reviewed [place] PDMP and patient regularly fills opioid prescriptions. Likely opioid-dependent. - Obviously has reasons for acute pain from recent surgery, discitis/osteomyelitis. Opioid tolerance PTA and may be increasing here. Bowel regimen also ordered. - Continue multimodal pain control as ordered: - Tylenol 1 g 3 times daily. - Oxy IR 10 mg every 4 hourly as needed for moderate pain - OxyContin 15 Mg every 12 hours - Lidocaine patch increased to 2 patches - Dilaudid 0.5 mg IV every 4 hourly as needed severe pain. Discussed need to limit/discontinue this, though prior to hydrotherapy will remain available. - Gabapentin 300 mg 3 times daily   Insomnia -Trazodone was increased to 150 mg nightly as needed  Liver nodularity: Noted incidentally on CT chest 04/09/2021. Hypoalbuminemia noted. No ascites or LE edema, or signs of hepatic encephalopathy. Ferritin wnl. - INR was elevated to 1.4 when checked 11/14.   Pulmonary nodules: Unchanged on last CT chest  71m and smalled definitively benign, no further evaluation recommended.  Reported renal lesion: Per pt. Follows with Dr. HLouis Meckel  - Follow up with Dr. HLouis Meckelrecommended.   Pleural effusion: Chronic,  multifactorial partially related to CHF and to hypoalbuminemia. Not sufficient for thoracentesis.   RN Pressure Injury Documentation: Pressure Injury 07/27/21 Sacrum Mid Unstageable - Full thickness tissue loss in which the base of the injury is covered by slough (yellow, tan, gray, green or brown) and/or eschar (tan, brown or black) in the wound bed. small pink pressure ulcer with bre (Active)  07/27/21 0255  Location: Sacrum  Location Orientation: Mid  Staging: Unstageable - Full thickness tissue loss in which the base of the injury is covered by slough (yellow, tan, gray, green or brown) and/or eschar (tan, brown or black) in the wound bed.  Wound Description (Comments): small pink pressure ulcer with break in skin 08/12/21; updated to unstageable pressure injury  Present on Admission: Yes   DVT prophylaxis: Eliquis Code Status: Full Family Communication: None at bedside. Disposition Plan:  Status is: Inpatient  Remains inpatient appropriate because: Patient is appropriate candidate for LTAC, but he was denied by peer to peer 12/16, written appeal was sent. Consultants:  ID Neurosurgery PCCM Cardiology  Procedures:  Left TTA 07/23/2021 TEE/DCCV 07/29/2021 PICC RUE 08/13/2021  Antimicrobials: Cefazolin 11/16 Ceftriaxone 11/14, 11/16-17 Clindamycin 11/15-16 Linezolid 11/15 Pen G 11/18 Pip-tazo 11/15-11/16 Vancomycin 11/14 Cefepime 12/8 - 12/12  Subjective:  Patient report good night sleep yesterday, no nausea , no vomiting, good appetite.  Objective: Vitals:   08/24/21 1124 08/24/21 1950 08/24/21 2304 08/25/21 0800  BP: (!) 118/56 137/67 (!) 156/82   Pulse: 65 60 63   Resp: _0 Temp: 97.9 F (36.6 C) 97.7 F (36.5 C) 97.9 F (36.6 C)   TempSrc: Oral Oral Oral   SpO2: 94% 96% 98% 98%  Weight:      Height:        Intake/Output Summary (Last 24 hours) at 08/25/2021 1023 Last data filed at 08/25/2021 [id number] per 24 hour  Intake 879.87 ml  Output  3800 ml  Net -2920.13 ml   Filed Weights   07/27/21 0252 07/29/21 0754 08/01/21 0459  Weight: 108.2 kg 108.2 kg 107 kg      Awake Alert, Oriented X 3, No new F.N deficits, Normal affect Symmetrical Chest wall movement, Good air movement bilaterally, CTAB RRR,No Gallops,Rubs or new Murmurs, No Parasternal Heave +ve B.Sounds, Abd Soft, No tenderness, No rebound - guarding or rigidity. No Cyanosis, Clubbing or edema,B/L BKA   Data Reviewed: I have personally reviewed following labs and imaging studies  CBC: Recent Labs  Lab 08/20/21 0330 08/25/21 0830  WBC 7.3 6.6  HGB 12.8* 14.2  HCT 39.8 44.3  MCV 90.7 93.7  PLT 288 1226  Basic Metabolic Panel: Recent Labs  Lab 08/20/21 0330 08/21/21 0905 08/25/21 0830  NA 131* 136 135  K 3.8 4.1 4.0  CL 99 104 102  CO2 _1 GLUCOSE 206* 242* 159*  BUN 27* 24* 18  CREATININE 1.33* 1.24 1.03  CALCIUM 8.2* 8.5* 8.7*   GFR: Estimated Creatinine Clearance: 107.2 mL/min (by C-G formula based on SCr of 1.03 mg/dL). Liver Function Tests: Recent Labs  Lab 08/20/21 0330  AST 20  ALT 33  ALKPHOS 108  BILITOT 0.4  PROT 5.8*  ALBUMIN 1.9*   No results for input(s): LIPASE, AMYLASE in the last 168 hours. No results for input(s): AMMONIA in  the last 168 hours. Coagulation Profile: No results for input(s): INR, PROTIME in the last 168 hours. Cardiac Enzymes: No results for input(s): CKTOTAL, CKMB, CKMBINDEX, TROPONINI in the last 168 hours. BNP (last 3 results) No results for input(s): PROBNP in the last 8760 hours. HbA1C: No results for input(s): HGBA1C in the last 72 hours. CBG: Recent Labs  Lab 08/24/21 0633 08/24/21 1126 08/24/21 1615 08/24/21 2302 08/25/21 0613  GLUCAP 164* 276* 199* 196* 160*   Lipid Profile: No results for input(s): CHOL, HDL, LDLCALC, TRIG, CHOLHDL, LDLDIRECT in the last 72 hours. Thyroid Function Tests: No results for input(s): TSH, T4TOTAL, FREET4, T3FREE, THYROIDAB in the last 72  hours. Anemia Panel: No results for input(s): VITAMINB12, FOLATE, FERRITIN, TIBC, IRON, RETICCTPCT in the last 72 hours.  Urine analysis:    Component Value Date/Time   COLORURINE YELLOW 07/22/2021 0250   APPEARANCEUR CLOUDY (A) 07/22/2021 0250   LABSPEC 1.018 07/22/2021 0250   PHURINE 5.0 07/22/2021 0250   GLUCOSEU >=500 (A) 07/22/2021 0250   HGBUR SMALL (A) 07/22/2021 0250   BILIRUBINUR NEGATIVE 07/22/2021 0250   KETONESUR 5 (A) 07/22/2021 0250   PROTEINUR 30 (A) 07/22/2021 0250   UROBILINOGEN 0.2 09/13/2019 0906   NITRITE NEGATIVE 07/22/2021 0250   LEUKOCYTESUR TRACE (A) 07/22/2021 0250   No results found for this or any previous visit (from the past 240 hour(s)).    Radiology Studies: No results found.  Scheduled Meds:  acetaminophen  1,000 mg Oral TID   amiodarone  200 mg Oral Daily   apixaban  5 mg Oral BID   vitamin C  1,000 mg Oral Daily   Chlorhexidine Gluconate Cloth  6 each Topical Daily   collagenase   Topical Daily   docusate sodium  100 mg Oral Daily   finasteride  5 mg Oral Daily   gabapentin  300 mg Oral TID   insulin aspart  0-15 Units Subcutaneous TID WC   insulin aspart  0-5 Units Subcutaneous QHS   insulin aspart  6 Units Subcutaneous TID WC   insulin detemir  36 Units Subcutaneous BID   isosorbide mononitrate  30 mg Oral Daily   lidocaine  2 patch Transdermal Q24H   methocarbamol  1,000 mg Oral Q8H   metoprolol succinate  50 mg Oral Daily   multivitamin with minerals  1 tablet Oral Daily   nutrition supplement (JUVEN)  1 packet Oral BID BM   nystatin cream   Topical BID   oxyCODONE  15 mg Oral Q12H   polyethylene glycol  17 g Oral Daily   Ensure Max Protein  11 oz Oral BID BM   sodium chloride flush  10-40 mL Intracatheter Q12H   tamsulosin  0.4 mg Oral Daily   testosterone  5 g Transdermal Daily   torsemide  20 mg Oral QODAY   Continuous Infusions:  sodium chloride Stopped (08/13/21 0457)   vancomycin 1,000 mg (08/25/21 8003)     LOS:  12 days    Phillips Climes, MD [facility] [email or web address] 08/25/2021, 10:23 AM

## 2021-08-25 NOTE — Progress Notes (Signed)
Occupational Therapy Treatment Patient Details Name: [name] MRN: [id number] DOB: [birth year] Today's Date: 08/25/2021   History of present illness Pt is a 55 y.o. 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 S/p TEE, DCCV on 11/22. Chest CT 12/4 with pleural effusion; thoracentesis attempted with insufficient fluid volume. PMH includes R BKA, aflutter, DM, HTN, depression, anxiety.   OT comments  Patient received in bed and did not want  to attempt chair transfer until we have a gel cushion but was agreeable to address sitting on EOB. Patient was mod assist +2 to get to EOB and was able to maintain sitting balance with BUE support and decreased right lateral leaning. Patient was able to perform trunk strengthening and balance exercises while seated on EOB with decreased complaints of pain while sitting up. Patient demonstrated improvement with sit to supine requiring min assist from mod assist. Acute OT to continue to follow.    Recommendations for follow up therapy are one component of a multi-disciplinary discharge planning process, led by the attending physician.  Recommendations may be updated based on patient status, additional functional criteria and insurance authorization.    Follow Up Recommendations  Skilled nursing-short term rehab (<3 hours/day)    Assistance Recommended at Discharge Frequent or constant Supervision/Assistance  Equipment Recommendations  None recommended by OT    Recommendations for Other Services      Precautions / Restrictions Precautions Precautions: Fall;Back Precaution Booklet Issued: No Precaution Comments: Back precautions for comfort Required Braces or Orthoses: Other Brace Other Brace: LLE limb guard; has RLE prosthetic in room  (not fitting currently per pt?) Restrictions Weight Bearing Restrictions: Yes LLE Weight Bearing: Non weight bearing       Mobility Bed Mobility Overal bed mobility: Needs Assistance Bed Mobility: Supine to Sit;Sit to Supine     Supine to sit: Mod assist;+2 for physical assistance Sit to supine: Min assist   General bed mobility comments: improvement with sit to supine to min assist with increased time    Transfers                   General transfer comment: patient did not want to perform transfer into recliner again until we have gel cushion.  Currently attempting to obtain     Balance Overall balance assessment: Needs assistance Sitting-balance support: Single extremity supported;Bilateral upper extremity supported Sitting balance-Leahy Scale: Poor Sitting balance - Comments: able to maintain sitting balance with one to two extremity support                                   ADL either performed or assessed with clinical judgement   ADL                                              Extremity/Trunk Assessment              Vision       Perception     Praxis      Cognition Arousal/Alertness: Awake/alert Behavior During Therapy: WFL for tasks assessed/performed Overall Cognitive Status: No family/caregiver present to determine baseline cognitive functioning Area  of Impairment: Attention;Safety/judgement;Awareness;Problem solving                   Current Attention Level: Selective     Safety/Judgement: Decreased awareness of deficits Awareness: Emergent Problem Solving: Requires verbal cues;Difficulty sequencing;Requires tactile cues General Comments: pleasant mood, pleased with progress          Exercises Other Exercises Other Exercises: UE reaching exercises to increase sititng balance for 2 sets of 10 for each extremity Other Exercises: lateral flexion seated exercises Other Exercises: trunk  flexion and extension with use of bedside table   Shoulder Instructions       General Comments      Pertinent Vitals/ Pain       Pain Assessment: Faces Faces Pain Scale: Hurts even more Pain Location: back and wound site Pain Descriptors / Indicators: Grimacing;Guarding;Moaning Pain Intervention(s): Premedicated before session;Monitored during session;Repositioned  Home Living                                          Prior Functioning/Environment              Frequency  Min 3X/week        Progress Toward Goals  OT Goals(current goals can now be found in the care plan section)  Progress towards OT goals: Progressing toward goals  Acute Rehab OT Goals Patient Stated Goal: get stronger and into chair OT Goal Formulation: With patient Time For Goal Achievement: 09/05/21 Potential to Achieve Goals: Good ADL Goals Pt Will Perform Grooming: sitting;with supervision Pt Will Perform Upper Body Bathing: with supervision;sitting Pt Will Perform Lower Body Dressing: with min assist;bed level;sitting/lateral leans Pt Will Transfer to Toilet: with mod assist;bedside commode;anterior/posterior transfer;with transfer board Pt/caregiver will Perform Home Exercise Program: Increased ROM;Left upper extremity;With written HEP provided Additional ADL Goal #1: Pt will complete bed mobility with mod assist as precursor to ADLs.  Plan Discharge plan remains appropriate    Co-evaluation    PT/OT/SLP Co-Evaluation/Treatment: Yes Reason for Co-Treatment: For patient/therapist safety;To address functional/ADL transfers   OT goals addressed during session: Strengthening/ROM      AM-PAC OT "6 Clicks" Daily Activity     Outcome Measure   Help from another person eating meals?: A Little Help from another person taking care of personal grooming?: A Little Help from another person toileting, which includes using toliet, bedpan, or urinal?: A Lot Help from another  person bathing (including washing, rinsing, drying)?: A Lot Help from another person to put on and taking off regular upper body clothing?: A Little Help from another person to put on and taking off regular lower body clothing?: A Lot 6 Click Score: 15    End of Session    OT Visit Diagnosis: Pain;Muscle weakness (generalized) (M62.81)   Activity Tolerance Patient tolerated treatment well   Patient Left in bed;with call bell/phone within reach   Nurse Communication Mobility status        Time: 7915-0569 OT Time Calculation (min): 30 min  Charges: OT General Charges $OT Visit: 1 Visit OT Treatments $Therapeutic Activity: 8-22 mins  [name], [place]  Pager [phone] Office [place] 08/25/2021, 3:17 PM

## 2021-08-26 ENCOUNTER — Inpatient Hospital Stay (INDEPENDENT_AMBULATORY_CARE_PROVIDER_SITE_OTHER): Payer: Medicare HMO | Admitting: Primary Care

## 2021-08-26 LAB — SEDIMENTATION RATE: Sed Rate: 45 mm/hr — ABNORMAL HIGH (ref 0–16)

## 2021-08-26 LAB — GLUCOSE, CAPILLARY
Glucose-Capillary: 204 mg/dL — ABNORMAL HIGH (ref 70–99)
Glucose-Capillary: 236 mg/dL — ABNORMAL HIGH (ref 70–99)
Glucose-Capillary: 194 mg/dL — ABNORMAL HIGH (ref 70–99)
Glucose-Capillary: 209 mg/dL — ABNORMAL HIGH (ref 70–99)

## 2021-08-26 LAB — C-REACTIVE PROTEIN: CRP: 2.9 mg/dL — ABNORMAL HIGH (ref <1.0)

## 2021-08-26 MED ORDER — HYDROMORPHONE HCL 1 MG/ML IJ SOLN
1.5000 mg | Freq: Every day | INTRAMUSCULAR | Status: DC | PRN
  Administered 2021-08-26 – 2021-09-08 (×11): 1.5 mg via INTRAVENOUS
  Filled 2021-08-26 (×15): qty 1.5

## 2021-08-26 NOTE — TOC Progression Note (Signed)
Transition of Care [facility]) - Progression Note    Patient Details  Name: [name] MRN: [id number] Date of Birth: [birth year]  [facility] CM/SW Contact  Zenon Mayo, RN Phone Number: 08/26/2021, 9:52 AM  Clinical Narrative:    NCM received handoff from previous NCM , Wendi , she states Renne Crigler with [facility] is working to get patient an Information systems manager for [employer], and patient is agreeable to go to [place] if get an authorization.   Expected Discharge Plan: [place] Barriers to Discharge: Continued Medical Work up  Expected Discharge Plan and Services Expected Discharge Plan: [place] In-house Referral: Clinical Social Work Discharge Planning Services: CM Consult, Follow-up appt scheduled Post Acute Care Choice: [place] arrangements for the past 2 months: Apartment                                       Social Determinants of Health (SDOH) Interventions    Readmission Risk Interventions No flowsheet data found.

## 2021-08-26 NOTE — Progress Notes (Addendum)
Pharmacy Antibiotic Note  [name] is a 55 y.o. male admitted on 07/21/2021 with GBS bacteremia related to L-DFI now s/p trans-tib amputation with progressive discitis/osteo on Vancomycin per patient preference.   Cefepime started 12/8 for worsening sacral pressure wound. Completed 7 days of cefepime as of 08/21/21>  cefepime discontinued 12/15 per ID.   SCr stable at 1 on 12/19, Afebrile with normal wbc.   Levels done 12/18  Vancomycin peak = 26 mcg/ml Vancomycin trough = 18 mcg/ml on vancomycin 750 mg q8 hr.  Calculated AUC = 570, is above goal AUC 400-550.  Vancomycin dose adjusted as below. No Scr since then.  Total duration need 8 weeks of IV vanc per Dr. Waldron Labs as discussed with  ID PICC placed 12/7  Disp: SNF   Plan: -Continue Vancomycin to 1000mg  q12h (estAUC 506, Cmax 30.6, Cmin 13.5)  Plan to continue Vancomcyin x 8 weeks per ID recommendation. - Will continue to follow renal function, culture results, LOT.  Monitor vancomycin trough weekly.  Next weekly VT due 12/22 check Scr at that time, BMET ordered.  Height: 6\' 3"  (190.5 cm) Weight: 107 kg (235 lb 14.3 oz) IBW/kg (Calculated) : 84.5  Temp (24hrs), Avg:98.2 F (36.8 C), Min:98 F (36.7 C), Max:98.4 F (36.9 C)  Recent Labs  Lab 08/20/21 0330 08/21/21 0415 08/21/21 0905 08/21/21 1358 08/25/21 0830  WBC 7.3  --   --   --  6.6  CREATININE 1.33*  --  1.24  --  1.03  VANCOTROUGH  --  22*  --  18  --   VANCOPEAK  --   --  26*  --   --      Estimated Creatinine Clearance: 107.2 mL/min (by C-G formula based on SCr of 1.03 mg/dL).    Allergies  Allergen Reactions   Bactrim [Sulfamethoxazole-Trimethoprim]    Ceprotin [Protein C Concentrate (Human)]    Ciprofloxacin Other (See Comments)    Kidney function   Levaquin [Levofloxacin]    11/14 Ceftriaxone x1 11/14 Vancomycin >> 11/15, 12/1>>  (plan x 8 weeks) 12/1 Cefepime>>12/5  ; 12/8 > 12/15 11/15 Zosyn >> 11/16 11/15 Clindamycin >>11/15 11/15  Linezolid>> 11/16 11/16 Rocephin>>11/18 11/18 Penicillin G>>12/1 by ID (2 weeks IV therapy, then 4-6 weeks amox until 09/14/21) 12/2 acyclovir for 7 days > 12/9  Vanc levels and antibiotic dose adjustments: 12/3 VP = 24; VT = 12 - AUC = 484 on 750mg  q8 12/9 VT 14>>continue 750 q8hr 12/15 VT = 22 @ 6256 (early) ? If accurate. Will redraw 12/15 VP = 26 @ 0905  (2 hr post end of  infus/dose given 0559 750mg  q8h) Scr 1.24 12/15 VT = 18 @ 1358 (calc AUC 570,, SCr 1.24) > adjusted dose to 1000 mg q12h , estAUC 506, Cmax 30.6, Cmin 13.5)   11/17 BCx: ngF 11/16 MRS PCR + 11/14 BCx: GBS pan S 11/15 UCx: GBS   Thank you for allowing pharmacy to be a part of this patients care.  Erin Hearing PharmD., BCPS Clinical Pharmacist 08/26/2021 10:19 AM

## 2021-08-26 NOTE — Progress Notes (Signed)
Id brief note   Patient with GBS bacteremia and diabetic foot infection, s/p left bka now, complicated by thoracic vertebral OM and epidural abscess, on pcn --> vanc (his desire), and hospital acquired sacral ulcer on cruise control in terms of abx treatment at this time  Crp improving 23.2 --> 1.3 on 12/14 Tee no vegetation Repeat mri 12/16 thoracic spine improved discitis/OM t6-7 finding; epidural abscess t6-7 also improving   Reviewed picture of sacral wound 12/15; wound care following    -continue current vanc (already planned 6 weeks -- 1 week left until 12/26) -- would transition to cefadroxil 1 gram twice daily for another 4 weeks given extent of disease and still abnormal imaging finding on 12/16 -continue aggressive wound care for sacral ulcer (already had 1 week tx for soft tissue infection -continue trending cbc, cmp, crp weekly or twice weekly  -ID will follow intermittently

## 2021-08-26 NOTE — Progress Notes (Signed)
Physical Therapy Wound Treatment Patient Details  Name: [name] MRN: [id number] Date of Birth: 1966-05-15  Today's Date: 08/26/2021 Time: 5456-2563 Time Calculation (min): 39 min  Subjective  Subjective Assessment Subjective: Pt continues to report sacral and lower back pain Patient and Family Stated Goals: Home to family Date of Onset:  (unknown)  Pain Score:  8/10 (premedicated)  Wound Assessment  Pressure Injury 07/27/21 Sacrum Mid Unstageable - Full thickness tissue loss in which the base of the injury is covered by slough (yellow, tan, gray, green or brown) and/or eschar (tan, brown or black) in the wound bed. small pink pressure ulcer with bre (Active)  Wound Image   08/26/21 1303  Dressing Type ABD;Barrier Film (skin prep);Moist to dry;Dakin's-soaked gauze;Gauze (Comment) 08/26/21 1303  Dressing Changed;Clean;Dry;Intact 08/26/21 1303  Dressing Change Frequency Daily 08/26/21 1303  State of Healing Eschar 08/26/21 1303  Site / Wound Assessment Yellow;Red;Brown 08/26/21 1303  % Wound base Red or Granulating 40% 08/25/21 1126  % Wound base Yellow/Fibrinous Exudate 50% 08/25/21 1126  % Wound base Black/Eschar 10% 08/25/21 1126  % Wound base Other/Granulation Tissue (Comment) 0% 08/25/21 1126  Peri-wound Assessment Erythema (blanchable) 08/26/21 1303  Wound Length (cm) 7 cm 08/21/21 1300  Wound Width (cm) 6 cm 08/21/21 1300  Wound Depth (cm) 1.8 cm 08/21/21 1300  Wound Surface Area (cm^2) 42 cm^2 08/21/21 1300  Wound Volume (cm^3) 75.6 cm^3 08/21/21 1300  Undermining (cm) 1.7 cm from 11:00-1:00 08/21/21 1300  Margins Unattached edges (unapproximated) 08/26/21 1303  Drainage Amount Moderate 08/26/21 1303  Drainage Description Green;Serosanguineous 08/26/21 1303  Treatment Debridement (Selective);Hydrotherapy (Pulse lavage);Other (Comment) 08/26/21 1303      Hydrotherapy Pulsed lavage therapy - wound location: sacral Pulsed Lavage with Suction (psi): 8 psi  (8-12) Pulsed Lavage with Suction - Normal Saline Used: 1000 mL Pulsed Lavage Tip: Tip with splash shield Selective Debridement Selective Debridement - Location: sacral Selective Debridement - Tools Used: Forceps, Scissors Selective Debridement - Tissue Removed: eschar    Wound Assessment and Plan  Wound Therapy - Assess/Plan/Recommendations Wound Therapy - Clinical Statement: Continues with blue/green drainage and foul odor; pt switched back to Dakin's yesterday. Wound bed appears to have some granulation tissue and minimal yellow slough removed today. Wound Therapy - Functional Problem List: Global weakness Factors Delaying/Impairing Wound Healing: Diabetes Mellitus, Incontinence, Infection - systemic/local, Immobility, Multiple medical problems Hydrotherapy Plan: Debridement, Dressing change, Patient/family education, Pulsatile lavage with suction Wound Therapy - Frequency: 6X / week Wound Therapy - Current Recommendations: PT Wound Therapy - Follow Up Recommendations: dressing changes by family/patient, dressing changes by RN  Wound Therapy Goals- Improve the function of patient's integumentary system by progressing the wound(s) through the phases of wound healing (inflammation - proliferation - remodeling) by: Wound Therapy Goals - Improve the function of patient's integumentary system by progressing the wound(s) through the phases of wound healing by: Decrease Necrotic Tissue to: 20% Decrease Necrotic Tissue - Progress: Progressing toward goal Increase Granulation Tissue to: 80% Increase Granulation Tissue - Progress: Progressing toward goal Goals/treatment plan/discharge plan were made with and agreed upon by patient/family: Yes Time For Goal Achievement: 7 days Wound Therapy - Potential for Goals: Good  Goals will be updated until maximal potential achieved or discharge criteria met.  Discharge criteria: when goals achieved, discharge from hospital, MD decision/surgical  intervention, no progress towards goals, refusal/missing three consecutive treatments without notification or medical reason.  GP     Charges PT Wound Care Charges $Wound Debridement up to 20 cm: < or  equal to 20 cm $ Wound Debridement each add'l 20 sqcm: 1 $PT Hydrotherapy Dressing: 1 dressing $PT PLS Gun and Tip: 1 Supply $PT Hydrotherapy Visit: 1 Visit  Wyona Almas, PT, DPT Acute Rehabilitation Services Pager [phone] Office [phone]       Deno Etienne 08/26/2021, 1:07 PM

## 2021-08-26 NOTE — Plan of Care (Signed)
°  Problem: Education: Goal: Knowledge of General Education information will improve Description: Including pain rating scale, medication(s)/side effects and non-pharmacologic comfort measures Outcome: Progressing   Problem: Health Behavior/Discharge Planning: Goal: Ability to manage health-related needs will improve Outcome: Progressing   Problem: Clinical Measurements: Goal: Ability to maintain clinical measurements within normal limits will improve Outcome: Progressing Goal: Will remain free from infection Outcome: Progressing Goal: Diagnostic test results will improve Outcome: Progressing Goal: Respiratory complications will improve Outcome: Progressing Goal: Cardiovascular complication will be avoided Outcome: Progressing   Problem: Activity: Goal: Risk for activity intolerance will decrease Outcome: Progressing   Problem: Nutrition: Goal: Adequate nutrition will be maintained Outcome: Progressing   Problem: Coping: Goal: Level of anxiety will decrease Outcome: Progressing   Problem: Elimination: Goal: Will not experience complications related to bowel motility Outcome: Progressing Goal: Will not experience complications related to urinary retention Outcome: Progressing   Problem: Pain Managment: Goal: General experience of comfort will improve Outcome: Progressing   Problem: Safety: Goal: Ability to remain free from injury will improve Outcome: Progressing   Problem: Skin Integrity: Goal: Risk for impaired skin integrity will decrease Outcome: Progressing   Problem: Education: Goal: Knowledge of General Education information will improve Description: Including pain rating scale, medication(s)/side effects and non-pharmacologic comfort measures Outcome: Progressing   Problem: Health Behavior/Discharge Planning: Goal: Ability to manage health-related needs will improve Outcome: Progressing   Problem: Clinical Measurements: Goal: Ability to maintain  clinical measurements within normal limits will improve Outcome: Progressing Goal: Will remain free from infection Outcome: Progressing Goal: Diagnostic test results will improve Outcome: Progressing Goal: Respiratory complications will improve Outcome: Progressing Goal: Cardiovascular complication will be avoided Outcome: Progressing   Problem: Activity: Goal: Risk for activity intolerance will decrease Outcome: Progressing   Problem: Nutrition: Goal: Adequate nutrition will be maintained Outcome: Progressing   Problem: Coping: Goal: Level of anxiety will decrease Outcome: Progressing   Problem: Elimination: Goal: Will not experience complications related to bowel motility Outcome: Progressing Goal: Will not experience complications related to urinary retention Outcome: Progressing   Problem: Pain Managment: Goal: General experience of comfort will improve Outcome: Progressing   Problem: Safety: Goal: Ability to remain free from injury will improve Outcome: Progressing   Problem: Skin Integrity: Goal: Risk for impaired skin integrity will decrease Outcome: Progressing   Problem: Education: Goal: Knowledge of disease or condition will improve Outcome: Progressing Goal: Understanding of medication regimen will improve Outcome: Progressing Goal: Individualized Educational Video(s) Outcome: Progressing

## 2021-08-26 NOTE — Progress Notes (Signed)
PT who does hydrotherapy made aware to take picture of the wound as per request of the MD.

## 2021-08-26 NOTE — Progress Notes (Signed)
PROGRESS NOTE  [name]  KDX:[id number] DOB: [birth year] DOA: 07/21/2021 PCP: Patient, No Pcp Per (Inactive)   Brief Narrative:  55 year old male with history of diabetes mellitus type 2, diabetic foot infection with prior right BKA, chronic diastolic CHF, hypertension, dyslipidemia presented with worsening left foot swelling/discoloration and discharge.  On presentation, he was hypotensive, tachycardic with A. fib with RVR with CT suggestive of first MTP osteomyelitis and soft tissue gas concerning for possible necrotizing fasciitis along with AKI with creatinine of 2.7 and mild DKA.  His blood pressures did not improve with IV fluids and he was transferred to ICU and PCCM was consulted.  Orthopedics was consulted.  He underwent left transtibial amputation on 07/23/2021.  He was found to have strep agalactiae bacteremia.  ID was consulted.  He was found to have vertebral discitis versus osteomyelitis on MRI.  He was also found to have new systolic dysfunction with LVEF of 35% along with new onset atrial flutter requiring IV amiodarone which has been switched to oral.  Cardiology was consulted.  He has been started on Eliquis.  S/p TEE followed by DCCV on 11/22.  Developed worsening back pain 11/26-11/27, ID reconsulted, requested MRI T/L-spine, ultimately performed 12/1, with contrast which showed Significant progression of discitis and osteomyelitis at T5 through T8. There is edema extending into the posterior elements. Extensive posterior epidural abscess has progressed since the prior study. Abscess extends from T3 through T9 and is exerting mass-effect on the cord with moderate spinal stenosis T4 through T8.   Due to report of pleural effusions on MRI, thoracentesis was attempted but there was insufficient fluid volume.   Neurosurgery, Dr. Ronnald Ramp, has evaluated the patient recommends no surgery. ID has changed antibiotics to vancomycin with plans for protracted IV abx course thru PICC. He  continues to have PT regularly, getting hydrotherapy for sacral wound which appeared to have infection for which cefepime was added.   Assessment & Plan: Principal Problem:   Discitis of thoracic region Active Problems:   Essential hypertension   Stage 3a chronic kidney disease (HCC)   Type 2 diabetes mellitus with diabetic polyneuropathy, with long-term current use of insulin (HCC)   Right below-knee amputee (HCC)   Diabetic infection of left foot (HCC)   Chronic diastolic (congestive) heart failure (HCC)   Severe sepsis with acute organ dysfunction (HCC)   Chest pain   AKI (acute kidney injury) ([place])   Gas gangrene of foot (HCC)   Atrial flutter (HCC)   Septic shock (HCC)   Systolic dysfunction   Pressure injury of skin   MSSA bacteremia   Septic shocK: -Evolving on admission from bacteremia/diabetic foot infection/thoracic discitis/abscess  - Off pressors.   -Septic shock resolved   Diabetic left foot infection:  - Status post left transtibial amputation on 07/23/2021. - Wound/wound VAC DC'ed per Dr. Sharol Given, recommends dry dressing which has been applied.   -stapels were removed 12/16 -PT/OT   Group B streptococcus bacteremia, progressive thoracic discitis/osteomyelitis.  - No vegetation on TTE or TEE during cardioversion.  MRI repeated with contrast on 12/1 showed Significant progression of discitis and osteomyelitis at T5 through T8 and extensive posterior epidural abscess has progressed since the prior study extending T3 through T9 exerting mass-effect on the cord with moderate spinal stenosis T4 through T8. - Neurosurgery consulted, continues to recommend IV antibiotics and advise against surgery in this difficult situation. Fortunately, there is clinical improvement with this strategy.  Repeat MRI 12/16 showing improvement in the osteomyelitis and discitis  and in the epidural abscess, patient remains clinically stable, neurosurgery has signed off, but will be available for  any concern or questions, patient need to follow-up with Dr. Ronnald Ramp about 3 weeks after discharge. -PICC line placed 12/7, continue with IV antibiotics via PICC, antibiotics management per ID,: current vanc (already planned 6 weeks -- 1 week left until 12/26) -- would transition to cefadroxil 1 gram twice daily for another 4 weeks given extent of disease and still abnormal imaging finding on 12/16  -Follow-up with neurosurgery Dr Sherley Bounds within 3 weeks after discharge  Sacral pressure wound:  -Infection noted 12/8.  Treated with total of 7 days of cefepime. - Continue offloading/turning, and getting OOB. - Continue topical nystatin -Continue with hydrotherapy  New onset systolic CHF: Echo showed EF of 35% (previous echo showed EF of 50 to 55% in 04/2021). Cardiomyopathy may be from new onset atrial flutter RVR related tachycardia versus stress induced cardiomyopathy from bacteremia and septic shock. - Cardiology no longer following, recommended torsemide 20mg  QOD for now. - Continue metop succinate, bidil. CrCl limited ACE/ARB/ARNI and MRA. - HF navigator consulted.  New onset atrial flutter: Maintained NSR since DCCV 11/22 - Continue metoprolol succinate 50mg  daily. - Continue amiodarone 200mg  daily - Restarted eliquis since no procedural plans, preferably uninterrupted anticoagulation for 1 month post DCCV.  Acute urinary retention:  - Pt reports retention-type symptoms chronically and has repeatedly required I&O caths which are met with significant resistance. Strong suspicion for BPH. Pt also reports hx penis fracture not repaired surgically years ago. -Started on finasteride, tamsulosin, Foley catheter inserted 12/13, he will need voiding trial in few days , will need to follow with urology as an outpatient regardless.   -Passed voiding trial yesterday, making urine with no evidence of further retention.  T2DM uncontrolled with hyperglycemia:  - Continue basal-bolus insulin.  CBG  better improved after increasing his Semglee to 36 units .continue with insulin sliding scale and scheduled Premeal NovoLog .  AKI on stage IIIa CKD: AKI resolving with limitation on diuresis. - Monitor intermittently while on vancomycin. CrCl now improved.   Pt takes testosterone/anabolic steroids (not prescription):  - Free level checked 11/29, low at 4.0. Rechecked 12/5, borderline low at  8.8.    Hyponatremia: Improved.   Hypomagnesemia: Improved with supplementation.  - Follow periodically.   Leukocytosis: Resolved.   Thrombocytopenia: Resolved    Generalized deconditioning - PT recommends CIR vs. SNF. Pt making gains and working with PT -  Unable to DC to SNF while undergoing hydrotherapy.  Recommendation for LTAC,   Acute on chronic pain: Follows with Dr. Nelva Bush, pain management.  Has received steroid injections to the back which, we've confirmed, will be delayed until completion of IV antibiotics. - Reviewed [place] PDMP and patient regularly fills opioid prescriptions. Likely opioid-dependent. - Obviously has reasons for acute pain from recent surgery, discitis/osteomyelitis. Opioid tolerance PTA and may be increasing here. Bowel regimen also ordered. - Continue multimodal pain control as ordered: - Tylenol 1 g 3 times daily. - Oxy IR 10 mg every 4 hourly as needed for moderate pain - OxyContin 15 Mg every 12 hours - Lidocaine patch increased to 2 patches - Dilaudid 0.5 mg IV every 4 hourly as needed severe pain. Discussed need to limit/discontinue this, though prior to hydrotherapy will remain available. - Gabapentin 300 mg 3 times daily   Insomnia -Trazodone was increased to 150 mg nightly as needed  Liver nodularity: Noted incidentally on CT chest 04/09/2021. Hypoalbuminemia noted.  No ascites or LE edema, or signs of hepatic encephalopathy. Ferritin wnl. - INR was elevated to 1.4 when checked 11/14.   Pulmonary nodules: Unchanged on last CT chest 37mm and smalled  definitively benign, no further evaluation recommended.  Reported renal lesion: Per pt. Follows with Dr. Louis Meckel.  - Follow up with Dr. Louis Meckel recommended.   Pleural effusion: Chronic, multifactorial partially related to CHF and to hypoalbuminemia. Not sufficient for thoracentesis.   RN Pressure Injury Documentation: Pressure Injury 07/27/21 Sacrum Mid Unstageable - Full thickness tissue loss in which the base of the injury is covered by slough (yellow, tan, gray, green or brown) and/or eschar (tan, brown or black) in the wound bed. small pink pressure ulcer with bre (Active)  07/27/21 0255  Location: Sacrum  Location Orientation: Mid  Staging: Unstageable - Full thickness tissue loss in which the base of the injury is covered by slough (yellow, tan, gray, green or brown) and/or eschar (tan, brown or black) in the wound bed.  Wound Description (Comments): small pink pressure ulcer with break in skin 08/12/21; updated to unstageable pressure injury  Present on Admission: Yes   DVT prophylaxis: Eliquis Code Status: Full Family Communication: None at bedside. Disposition Plan:  Status is: Inpatient  Remains inpatient appropriate because: Patient is appropriate candidate for LTAC, but he was denied by insurance company.    Consultants:  ID Neurosurgery PCCM Cardiology  Procedures:  Left TTA 07/23/2021 TEE/DCCV 07/29/2021 PICC RUE 08/13/2021  Antimicrobials: Cefazolin 11/16 Ceftriaxone 11/14, 11/16-17 Clindamycin 11/15-16 Linezolid 11/15 Pen G 11/18 Pip-tazo 11/15-11/16 Vancomycin 11/14 Cefepime 12/8 - 12/12  Subjective:  Split-night sleep yesterday, no further urinary retention after discontinuing Foley catheter yesterday, reports pain with hydrotherapy, so I have increased his Dilaudid dose before hydrotherapy.  Objective: Vitals:   08/25/21 1636 08/25/21 1953 08/25/21 2358 08/26/21 0745  BP:  139/74 115/90   Pulse: (!) 57 (!) 59 (!) 57 66  Resp: $Remo'19 20 20   'iWtIq$ Temp:  98.4 F (36.9 C) 98.2 F (36.8 C) 98 F (36.7 C)   TempSrc: Oral Oral Oral   SpO2:  95% 98% 98%  Weight:      Height:        Intake/Output Summary (Last 24 hours) at 08/26/2021 1036 Last data filed at 08/25/2021 1730 Gross per 24 hour  Intake 200 ml  Output 800 ml  Net -600 ml   Filed Weights   07/27/21 0252 07/29/21 0754 08/01/21 0459  Weight: 108.2 kg 108.2 kg 107 kg      Awake Alert, Oriented X 3, No new F.N deficits, Normal affect Symmetrical Chest wall movement, Good air movement bilaterally, CTAB RRR,No Gallops,Rubs or new Murmurs, No Parasternal Heave +ve B.Sounds, Abd Soft, No tenderness, No rebound - guarding or rigidity. No Cyanosis, Clubbing or edema, No new Rash or bruise   No Cyanosis, Clubbing or edema,B/L BKA   Data Reviewed: I have personally reviewed following labs and imaging studies  CBC: Recent Labs  Lab 08/20/21 0330 08/25/21 0830  WBC 7.3 6.6  HGB 12.8* 14.2  HCT 39.8 44.3  MCV 90.7 93.7  PLT 288 161   Basic Metabolic Panel: Recent Labs  Lab 08/20/21 0330 08/21/21 0905 08/25/21 0830  NA 131* 136 135  K 3.8 4.1 4.0  CL 99 104 102  CO2 $Re'26 27 25  'OLi$ GLUCOSE 206* 242* 159*  BUN 27* 24* 18  CREATININE 1.33* 1.24 1.03  CALCIUM 8.2* 8.5* 8.7*   GFR: Estimated Creatinine Clearance: 107.2 mL/min (by C-G formula  based on SCr of 1.03 mg/dL). Liver Function Tests: Recent Labs  Lab 08/20/21 0330  AST 20  ALT 33  ALKPHOS 108  BILITOT 0.4  PROT 5.8*  ALBUMIN 1.9*   No results for input(s): LIPASE, AMYLASE in the last 168 hours. No results for input(s): AMMONIA in the last 168 hours. Coagulation Profile: No results for input(s): INR, PROTIME in the last 168 hours. Cardiac Enzymes: No results for input(s): CKTOTAL, CKMB, CKMBINDEX, TROPONINI in the last 168 hours. BNP (last 3 results) No results for input(s): PROBNP in the last 8760 hours. HbA1C: No results for input(s): HGBA1C in the last 72 hours. CBG: Recent Labs  Lab  08/25/21 0613 08/25/21 1116 08/25/21 1718 08/25/21 2138 08/26/21 0621  GLUCAP 160* 260* 134* 153* 204*   Lipid Profile: No results for input(s): CHOL, HDL, LDLCALC, TRIG, CHOLHDL, LDLDIRECT in the last 72 hours. Thyroid Function Tests: No results for input(s): TSH, T4TOTAL, FREET4, T3FREE, THYROIDAB in the last 72 hours. Anemia Panel: No results for input(s): VITAMINB12, FOLATE, FERRITIN, TIBC, IRON, RETICCTPCT in the last 72 hours.  Urine analysis:    Component Value Date/Time   COLORURINE YELLOW 07/22/2021 0250   APPEARANCEUR CLOUDY (A) 07/22/2021 0250   LABSPEC 1.018 07/22/2021 0250   PHURINE 5.0 07/22/2021 0250   GLUCOSEU >=500 (A) 07/22/2021 0250   HGBUR SMALL (A) 07/22/2021 0250   BILIRUBINUR NEGATIVE 07/22/2021 0250   KETONESUR 5 (A) 07/22/2021 0250   PROTEINUR 30 (A) 07/22/2021 0250   UROBILINOGEN 0.2 09/13/2019 0906   NITRITE NEGATIVE 07/22/2021 0250   LEUKOCYTESUR TRACE (A) 07/22/2021 0250   No results found for this or any previous visit (from the past 240 hour(s)).    Radiology Studies: No results found.  Scheduled Meds:  acetaminophen  1,000 mg Oral TID   amiodarone  200 mg Oral Daily   apixaban  5 mg Oral BID   vitamin C  1,000 mg Oral Daily   Chlorhexidine Gluconate Cloth  6 each Topical Daily   [START ON 09/03/2021] collagenase   Topical Daily   docusate sodium  100 mg Oral Daily   finasteride  5 mg Oral Daily   gabapentin  300 mg Oral TID   insulin aspart  0-15 Units Subcutaneous TID WC   insulin aspart  0-5 Units Subcutaneous QHS   insulin aspart  6 Units Subcutaneous TID WC   insulin detemir  36 Units Subcutaneous BID   isosorbide mononitrate  30 mg Oral Daily   methocarbamol  1,000 mg Oral Q8H   metoprolol succinate  50 mg Oral Daily   multivitamin with minerals  1 tablet Oral Daily   nystatin cream   Topical BID   oxyCODONE  15 mg Oral Q12H   Ensure Max Protein  11 oz Oral BID BM   silver nitrate applicators  1 application Topical Once    sodium chloride flush  10-40 mL Intracatheter Q12H   sodium hypochlorite   Irrigation Daily   tamsulosin  0.4 mg Oral Daily   testosterone  5 g Transdermal Daily   torsemide  20 mg Oral QODAY   Continuous Infusions:  sodium chloride Stopped (08/13/21 0457)   vancomycin 1,000 mg (08/26/21 0609)     LOS: 41 days    Phillips Climes, MD [facility] [email or web address] 08/26/2021, 10:36 AM

## 2021-08-26 NOTE — Progress Notes (Signed)
Inpatient Rehab Admissions Coordinator:   Consult received and chart re-screened by Shann Medal, PT, DPT.  Note that pt continues to decline out of bed mobility.  Has transferred OOB only 2x and took 3 people to do so.  He continues to demo poor tolerance and participation to justify a CIR admission.  [facility] will not offer a bed.   Shann Medal, PT, DPT Admissions Coordinator [phone] 08/26/21  10:02 AM

## 2021-08-27 LAB — GLUCOSE, CAPILLARY
Glucose-Capillary: 119 mg/dL — ABNORMAL HIGH (ref 70–99)
Glucose-Capillary: 161 mg/dL — ABNORMAL HIGH (ref 70–99)
Glucose-Capillary: 170 mg/dL — ABNORMAL HIGH (ref 70–99)
Glucose-Capillary: 95 mg/dL (ref 70–99)

## 2021-08-27 NOTE — Plan of Care (Signed)
°  Problem: Education: Goal: Knowledge of General Education information will improve Description: Including pain rating scale, medication(s)/side effects and non-pharmacologic comfort measures Outcome: Progressing   Problem: Health Behavior/Discharge Planning: Goal: Ability to manage health-related needs will improve Outcome: Progressing   Problem: Clinical Measurements: Goal: Ability to maintain clinical measurements within normal limits will improve Outcome: Progressing Goal: Will remain free from infection Outcome: Progressing Goal: Diagnostic test results will improve Outcome: Progressing Goal: Respiratory complications will improve Outcome: Progressing Goal: Cardiovascular complication will be avoided Outcome: Progressing   Problem: Activity: Goal: Risk for activity intolerance will decrease Outcome: Progressing   Problem: Nutrition: Goal: Adequate nutrition will be maintained Outcome: Progressing   Problem: Coping: Goal: Level of anxiety will decrease Outcome: Progressing   Problem: Elimination: Goal: Will not experience complications related to bowel motility Outcome: Progressing Goal: Will not experience complications related to urinary retention Outcome: Progressing   Problem: Pain Managment: Goal: General experience of comfort will improve Outcome: Progressing   Problem: Safety: Goal: Ability to remain free from injury will improve Outcome: Progressing   Problem: Skin Integrity: Goal: Risk for impaired skin integrity will decrease Outcome: Progressing   Problem: Education: Goal: Knowledge of General Education information will improve Description: Including pain rating scale, medication(s)/side effects and non-pharmacologic comfort measures Outcome: Progressing   Problem: Health Behavior/Discharge Planning: Goal: Ability to manage health-related needs will improve Outcome: Progressing   Problem: Clinical Measurements: Goal: Ability to maintain  clinical measurements within normal limits will improve Outcome: Progressing Goal: Will remain free from infection Outcome: Progressing Goal: Diagnostic test results will improve Outcome: Progressing Goal: Respiratory complications will improve Outcome: Progressing Goal: Cardiovascular complication will be avoided Outcome: Progressing   Problem: Activity: Goal: Risk for activity intolerance will decrease Outcome: Progressing   Problem: Nutrition: Goal: Adequate nutrition will be maintained Outcome: Progressing   Problem: Coping: Goal: Level of anxiety will decrease Outcome: Progressing   Problem: Elimination: Goal: Will not experience complications related to bowel motility Outcome: Progressing Goal: Will not experience complications related to urinary retention Outcome: Progressing   Problem: Pain Managment: Goal: General experience of comfort will improve Outcome: Progressing   Problem: Safety: Goal: Ability to remain free from injury will improve Outcome: Progressing   Problem: Skin Integrity: Goal: Risk for impaired skin integrity will decrease Outcome: Progressing   Problem: Education: Goal: Knowledge of disease or condition will improve Outcome: Progressing Goal: Understanding of medication regimen will improve Outcome: Progressing Goal: Individualized Educational Video(s) Outcome: Progressing   Problem: Activity: Goal: Ability to tolerate increased activity will improve Outcome: Progressing   Problem: Cardiac: Goal: Ability to achieve and maintain adequate cardiopulmonary perfusion will improve Outcome: Progressing   Problem: Health Behavior/Discharge Planning: Goal: Ability to safely manage health-related needs after discharge will improve Outcome: Progressing

## 2021-08-27 NOTE — Progress Notes (Addendum)
[place] for Infectious Disease  Date of Admission:  07/21/2021       Lines: 12/07-c rue picc  Abx: 11/30-c vanc  11/18-11/29 pcn g 11/14-18 piptazo --> ceftriaxone  ASSESSMENT: Gbs bacteremia Left foot infection s/p bka thoracic vertebral OM and epidural abscess   11/16 and 12/01 mri lumbar spine no pyogenic focus 11/18 cspine mri no pyogenic focus 11/22 tee negative 12/16 repeat mri thoracic spine improved pyogenic changes  Nsg previously seen -- no surgical intervention needed No epidural abscess/thoracic spine sampling done  --------- 12/21 assessment Clinically doing well Patient previously adamant about getting vancomycin despite explanation from ID team about that not quite appropriate. He had remained on this. There is plan to finish the 6 weeks iv abx on 12/27. However given extent of thoracic pyogenic focus, plan to transition to oral antibiotics for at least 4 weeks     PLAN: Finish iv vanc on 12/27 On 12/27 transition to cefadroxil 1 gram po bid for at least 4 more weeks Repeat mri thoracic spine in around 3 weeks ID will see intermittently   Principal Problem:   Discitis of thoracic region Active Problems:   Essential hypertension   Stage 3a chronic kidney disease ([place])   Type 2 diabetes mellitus with diabetic polyneuropathy, with long-term current use of insulin (HCC)   Right below-knee amputee (HCC)   Diabetic infection of left foot (HCC)   Chronic diastolic (congestive) heart failure (HCC)   Severe sepsis with acute organ dysfunction (HCC)   Chest pain   AKI (acute kidney injury) ([place])   Gas gangrene of foot (HCC)   Atrial flutter (HCC)   Septic shock (HCC)   Systolic dysfunction   Pressure injury of skin   MSSA bacteremia   Allergies  Allergen Reactions   Bactrim [Sulfamethoxazole-Trimethoprim]    Ceprotin [Protein C Concentrate (Human)]    Ciprofloxacin Other (See Comments)    Kidney function   Levaquin  [Levofloxacin]     Scheduled Meds:  acetaminophen  1,000 mg Oral TID   amiodarone  200 mg Oral Daily   apixaban  5 mg Oral BID   vitamin C  1,000 mg Oral Daily   Chlorhexidine Gluconate Cloth  6 each Topical Daily   [START ON 09/03/2021] collagenase   Topical Daily   docusate sodium  100 mg Oral Daily   finasteride  5 mg Oral Daily   gabapentin  300 mg Oral TID   insulin aspart  0-15 Units Subcutaneous TID WC   insulin aspart  0-5 Units Subcutaneous QHS   insulin aspart  6 Units Subcutaneous TID WC   insulin detemir  36 Units Subcutaneous BID   isosorbide mononitrate  30 mg Oral Daily   methocarbamol  1,000 mg Oral Q8H   metoprolol succinate  50 mg Oral Daily   multivitamin with minerals  1 tablet Oral Daily   nystatin cream   Topical BID   oxyCODONE  15 mg Oral Q12H   Ensure Max Protein  11 oz Oral BID BM   silver nitrate applicators  1 application Topical Once   sodium chloride flush  10-40 mL Intracatheter Q12H   sodium hypochlorite   Irrigation Daily   tamsulosin  0.4 mg Oral Daily   testosterone  5 g Transdermal Daily   torsemide  20 mg Oral QODAY   Continuous Infusions:  sodium chloride Stopped (08/13/21 0457)   vancomycin 1,000 mg (08/27/21 0615)   PRN Meds:.alum & mag  hydroxide-simeth, bisacodyl, diphenhydrAMINE, guaiFENesin-dextromethorphan, HYDROmorphone (DILAUDID) injection, HYDROmorphone (DILAUDID) injection, liver oil-zinc oxide, ondansetron, oxyCODONE, phenol, polyethylene glycol, sodium chloride flush, traZODone   SUBJECTIVE: Brought up the issue about vancomycin again [facility] he is feeling better No worsening back pain or new pain No fever/chill No n/v/diarrhea Reviewed 12/16 mri thoracic spine with him  Review of Systems: ROS All other ROS was negative, except mentioned above     OBJECTIVE: Vitals:   08/26/21 2319 08/27/21 0336 08/27/21 0741 08/27/21 1116  BP: (!) 152/85 136/76 (!) 152/78 132/82  Pulse: (!) 55 63 60 80  Resp: 16 15 13 14   Temp:  98 F (36.7 C) 98.4 F (36.9 C) 97.6 F (36.4 C) 98 F (36.7 C)  TempSrc: Oral Oral Oral Oral  SpO2: 97% 94% 98% 97%  Weight:      Height:       Body mass index is 29.48 kg/m.  Physical Exam   Lab Results Lab Results  Component Value Date   WBC 6.6 08/25/2021   HGB 14.2 08/25/2021   HCT 44.3 08/25/2021   MCV 93.7 08/25/2021   PLT 151 08/25/2021    Lab Results  Component Value Date   CREATININE 1.03 08/25/2021   BUN 18 08/25/2021   NA 135 08/25/2021   K 4.0 08/25/2021   CL 102 08/25/2021   CO2 25 08/25/2021    Lab Results  Component Value Date   ALT 33 08/20/2021   AST 20 08/20/2021   ALKPHOS 108 08/20/2021   BILITOT 0.4 08/20/2021      Microbiology: No results found for this or any previous visit (from the past 240 hour(s)).   Serology:   Imaging: If present, new imagings (plain films, ct scans, and mri) have been personally visualized and interpreted; radiology reports have been reviewed. Decision making incorporated into the Impression / Recommendations.   12/16 mri thoracic spine 1. Interval improvement in discitis and osteomyelitis centered at T6-7. Bone marrow edema and enhancement extends into T5 and T8. Posterior epidural abscess centered at T6-7 has improved. There remains moderate spinal stenosis with deformity of the cord due to stenosis from the abscess. This is similar in degree to the prior study. 2. No interval fracture. No new area of infection in the thoracic spine. 3. Small bilateral pleural effusions have improved in the interval.  Jabier Mutton, [place] [facility] [place] [phone] pager    08/27/2021, 1:33 PM

## 2021-08-27 NOTE — Progress Notes (Signed)
Occupational Therapy Treatment Patient Details Name: [name] MRN: [id number] DOB: 01/22/1966 Today's Date: 08/27/2021   History of present illness Pt is a 55 y.o. 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 S/p TEE, DCCV on 11/22. Chest CT 12/4 with pleural effusion; thoracentesis attempted with insufficient fluid volume. PMH includes R BKA, aflutter, DM, HTN, depression, anxiety.   OT comments  Discussed with patient using ROHO cushion instead of gel.  Following discussion patient stated he was willing to try next session. Patient was able to get to EOB with mod assist +2 but had increased pain at wound site and could not tolerate after several attempts of repositioning.  Patient was returned to supine and pad changed due to wetness.  Acute OT to continue to follow.    Recommendations for follow up therapy are one component of a multi-disciplinary discharge planning process, led by the attending physician.  Recommendations may be updated based on patient status, additional functional criteria and insurance authorization.    Follow Up Recommendations  Skilled nursing-short term rehab (<3 hours/day)    Assistance Recommended at Discharge Frequent or constant Supervision/Assistance  Equipment Recommendations  None recommended by OT    Recommendations for Other Services      Precautions / Restrictions Precautions Precautions: Fall;Back Precaution Booklet Issued: No Precaution Comments: Back precautions for comfort Required Braces or Orthoses: Other Brace Other Brace: LLE limb guard; has RLE prosthetic in room (not fitting currently per pt?) Restrictions Weight Bearing Restrictions: Yes LLE Weight Bearing: Non weight bearing       Mobility Bed Mobility Overal  bed mobility: Needs Assistance Bed Mobility: Supine to Sit;Sit to Supine Rolling: Min guard   Supine to sit: Mod assist;+2 for physical assistance Sit to supine: Mod assist;+2 for physical assistance   General bed mobility comments: once patient was able to get to EOB he had increased sacral pain from wound and could not tolerate    Transfers                   General transfer comment: discussed gettting a ROHO cushion instead of gel and patient was agreeable for next session     Balance Overall balance assessment: Needs assistance Sitting-balance support: Single extremity supported;Bilateral upper extremity supported Sitting balance-Leahy Scale: Poor Sitting balance - Comments: was able to maintain balance with UE support, but for limited time due to pain                                   ADL either performed or assessed with clinical judgement   ADL                                              Extremity/Trunk Assessment              Vision       Perception     Praxis      Cognition Arousal/Alertness: Awake/alert Behavior During Therapy: WFL for tasks assessed/performed Overall Cognitive Status: No family/caregiver present to determine baseline cognitive functioning Area of Impairment: Attention;Safety/judgement;Awareness;Problem solving  Current Attention Level: Selective     Safety/Judgement: Decreased awareness of deficits Awareness: Emergent Problem Solving: Requires verbal cues;Difficulty sequencing General Comments: concerned about wound healing          Exercises     Shoulder Instructions       General Comments      Pertinent Vitals/ Pain       Pain Assessment: Faces Faces Pain Scale: Hurts whole lot Pain Location: back and wound site Pain Descriptors / Indicators: Grimacing;Guarding;Moaning Pain Intervention(s): Premedicated before session;Repositioned;Limited activity  within patient's tolerance  Home Living                                          Prior Functioning/Environment              Frequency  Min 3X/week        Progress Toward Goals  OT Goals(current goals can now be found in the care plan section)  Progress towards OT goals: Progressing toward goals  Acute Rehab OT Goals Patient Stated Goal: get better OT Goal Formulation: With patient Time For Goal Achievement: 09/05/21 Potential to Achieve Goals: Good ADL Goals Pt Will Perform Grooming: sitting;with supervision Pt Will Perform Upper Body Bathing: with supervision;sitting Pt Will Perform Lower Body Dressing: with min assist;bed level;sitting/lateral leans Pt Will Transfer to Toilet: with mod assist;bedside commode;anterior/posterior transfer;with transfer board Pt/caregiver will Perform Home Exercise Program: Increased ROM;Left upper extremity;With written HEP provided Additional ADL Goal #1: Pt will complete bed mobility with mod assist as precursor to ADLs.  Plan Discharge plan remains appropriate    Co-evaluation    PT/OT/SLP Co-Evaluation/Treatment: Yes Reason for Co-Treatment: Complexity of the patient's impairments (multi-system involvement);To address functional/ADL transfers   OT goals addressed during session: Strengthening/ROM      AM-PAC OT "6 Clicks" Daily Activity     Outcome Measure   Help from another person eating meals?: A Little Help from another person taking care of personal grooming?: A Little Help from another person toileting, which includes using toliet, bedpan, or urinal?: A Lot Help from another person bathing (including washing, rinsing, drying)?: A Lot Help from another person to put on and taking off regular upper body clothing?: A Little Help from another person to put on and taking off regular lower body clothing?: A Lot 6 Click Score: 15    End of Session    OT Visit Diagnosis: Pain;Muscle weakness  (generalized) (M62.81) Pain - Right/Left: Left Pain - part of body: Hip   Activity Tolerance Patient limited by pain   Patient Left in bed;with call bell/phone within reach   Nurse Communication Mobility status;Other (comment) (check wound bandages)        Time: 6387-5643 OT Time Calculation (min): 28 min  Charges: OT General Charges $OT Visit: 1 Visit OT Treatments $Therapeutic Activity: 8-22 mins  [name], [place]  Pager [phone] Office [place] 08/27/2021, 11:44 AM

## 2021-08-27 NOTE — Progress Notes (Signed)
Nutrition Follow-up  DOCUMENTATION CODES:   Not applicable  INTERVENTION:   - Continue Ensure Max po BID, each supplement provides 150 kcal and 30 grams of protein  - Continue MVI with minerals daily  - Recommend obtaining updated weight  NUTRITION DIAGNOSIS:   Increased nutrient needs related to wound healing as evidenced by estimated needs.  Ongoing, being addressed via oral nutrition supplements  GOAL:   Patient will meet greater than or equal to 90% of their needs  Progressing  MONITOR:   PO intake, Supplement acceptance, Labs, Weight trends, Skin, I & O's  REASON FOR ASSESSMENT:   Consult Assessment of nutrition requirement/status  ASSESSMENT:   55 yo male with a PMH of diabetes mellitus type 2, diabetic foot infection with prior right BKA, chronic diastolic CHF, hypertension, dyslipidemia presented with worsening left foot swelling/discoloration and discharge. Admitted with discitis of thoracic region.  Noted DTPI to sacrum has evolved to an unstageable pressure injury. Pt currently receiving hydrotherapy for wound.  No documented weights since 08/01/21. Recommend obtaining updated weight.  Attempted to speak with pt x 2. Pt working with PT/OT on first attempt and receiving hydrotherapy on second attempt. PO intake has remained adequate with most meal completions >90%. Pt accepting most Ensure Max supplements per [facility] documentation. Will continue with current regimen at this time.  Pt's disposition at discharge is pending. Pt not a candidate for CIR.  Meal Completion: 80-100% x last 8 documented meals  Medications reviewed and include: vitamin C 1000 mg daily, colace, SSI, novolog 6 units TID with meals, levemir 36 units BID, MVI with minerals daily, Ensure Max BID, torsemide, IV abx  Labs reviewed. CBG's: 119-260 x 24 hours  UOP: 3800 ml x 24 hours I/O's: -74.2 L since admit  Diet Order:   Diet Order             Diet Carb Modified Fluid consistency:  Thin; Room service appropriate? Yes  Diet effective now                   EDUCATION NEEDS:   Education needs have been addressed  Skin:  Skin Assessment: Skin Integrity Issues: Unstageable: coccyx/sacrum Incisions: L leg, closed (11/16)  Last BM:  08/26/21 medium type 4  Height:   Ht Readings from Last 1 Encounters:  07/29/21 6\' 3"  (1.905 m)    Weight:   Wt Readings from Last 1 Encounters:  08/01/21 107 kg    BMI:  Body mass index is 29.48 kg/m.  Estimated Nutritional Needs:   Kcal:  2300-2500  Protein:  135-150 grams  Fluid:  >2.3 L    Gustavus Bryant, MS, RD, LDN Inpatient Clinical Dietitian Please see [facility] for contact information.

## 2021-08-27 NOTE — Progress Notes (Signed)
Physical Therapy Treatment Patient Details Name: [name] MRN: [id number] DOB: May 07, 1966 Today's Date: 08/27/2021   History of Present Illness Pt is a 55 y.o. 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 S/p TEE, DCCV on 11/22. Chest CT 12/4 with pleural effusion; thoracentesis attempted with insufficient fluid volume. PMH includes R BKA, aflutter, DM, HTN, depression, anxiety.   PT Comments    Pt slowly progressing with mobility. Pt now agreeable to trial use of roho cushion next session to allow for transfer training to recliner/wheelchair and improving sitting tolerance. Pt also agreeable to hoyer lift transfer, but will need to be moved to [facility] lift room to allow use of bilateral amputee sling (nursing staff now working on this). Pt with poor tolerance to sitting EOB tolerance this session due to significant sacral wound pain despite premedication. Pt with limited d/c options; if pt to return home, will require new wheelchair, pressure-relieving cushion, potential lift equipment (?) and assist +1-2 for mobility and ADL tasks. Will continue to follow acutely.    Recommendations for follow up therapy are one component of a multi-disciplinary discharge planning process, led by the attending physician.  Recommendations may be updated based on patient status, additional functional criteria and insurance authorization.  Follow Up Recommendations  Acute inpatient rehab (3hours/day) - [facility] & [facility] declined; pt refusing SNF     Assistance Recommended at Discharge Frequent or constant Supervision/Assistance  Equipment Recommendations  Wheelchair (measurements PT);Wheelchair cushion (measurements PT);Hospital bed    Recommendations for Other Services        Precautions / Restrictions Precautions Precautions: Fall;Back Precaution Booklet Issued: No Precaution Comments: Back precautions for comfort Required Braces or Orthoses: Other Brace Other Brace: LLE limb guard; has RLE prosthetic in room Restrictions Weight Bearing Restrictions: Yes LLE Weight Bearing: Non weight bearing     Mobility  Bed Mobility Overal bed mobility: Needs Assistance Bed Mobility: Rolling;Sidelying to Sit;Sit to Sidelying Rolling: Min guard Sidelying to sit: Mod assist;+2 for physical assistance;HOB elevated Supine to sit: Mod assist;+2 for physical assistance Sit to supine: Mod assist;+2 for physical assistance Sit to sidelying: Mod assist;+2 for physical assistance;HOB elevated General bed mobility comments: Heavy reliance on bed rail to roll, modA+1-2 for trunk elevation from elevated HOB to achieve fully upright sitting; pt c/o unbearable sacral/wound pain once sitting EOB with multiple attempts to reposition and provide more pillows/cushions for backside, ultimately unable to tolerate requiring return to supine with modA+2 for trunk and BLE management. pt pulls self up in bed mod indep with trapeze bar    Transfers                   General transfer comment:  (pt reports agreeable to try roho cushion in recliner for OOB next session; hope to have pt move to [facility] room to allow for bilateral amputee sling use to recliner, pt agreeable)    Ambulation/Gait                   Stairs             Wheelchair Mobility    Modified Rankin (Stroke Patients Only)       Balance Overall balance assessment: Needs assistance Sitting-balance support: Single extremity supported;Bilateral upper extremity supported Sitting balance-Leahy Scale: Poor Sitting balance - Comments: able to maintain  static sitting balance without external assist, although heavy reliance on UE support; improving ability to lean on L elbow then return to midline posture  with min-modA, heavy UE use                                    Cognition Arousal/Alertness: Awake/alert Behavior During Therapy: WFL for tasks assessed/performed;Anxious Overall Cognitive Status: No family/caregiver present to determine baseline cognitive functioning Area of Impairment: Attention;Safety/judgement;Awareness;Problem solving                   Current Attention Level: Selective     Safety/Judgement: Decreased awareness of deficits Awareness: Emergent Problem Solving: Requires verbal cues;Difficulty sequencing General Comments: anxious regarding pain sitting EOB requiring redirection and cues for breathing strategies. initially adamantly against roho cushion (since a friend told him he needed gel cushion), but ultimately agreeable to try once cushion explained. becomes hyper-focused on certain aspects of conversation requiring redirection to see 'big picture'        Exercises      General Comments General comments (skin integrity, edema, etc.): increased time discussing d/c planning as [facility] & [facility] inpatient rehab have declined pt (apparently LTACH has declined as well per MD note); pt adamantly against d/c to SNF; pt reports, "Well the 4th floor is going to have to take me" - further explained why this is likely not an option at the moment, and if pt refusing SNF, will need to be prepared for d/c home - pt seems to be coming around to reality that he may d/c home; needs further discussion regarding DME and assist needs. Reports he will need new w/c and cushion; apt is on 2nd floor with elevator access, interior is w/c accessible      Pertinent Vitals/Pain Pain Assessment: Faces Faces Pain Scale: Hurts whole lot Pain Location: back and wound site Pain Descriptors / Indicators: Grimacing;Guarding;Moaning Pain Intervention(s): Premedicated before session;Limited activity within patient's tolerance;Repositioned    Home Living                           Prior Function            PT Goals (current goals can now be found in the care plan section) Progress towards PT goals: Progressing toward goals    Frequency    Min 3X/week      PT Plan Current plan remains appropriate    Co-evaluation PT/OT/SLP Co-Evaluation/Treatment: Yes Reason for Co-Treatment: Complexity of the patient's impairments (multi-system involvement);For patient/therapist safety;To address functional/ADL transfers PT goals addressed during session: Mobility/safety with mobility;Balance;Strengthening/ROM OT goals addressed during session: Strengthening/ROM      AM-PAC PT "6 Clicks" Mobility   Outcome Measure  Help needed turning from your back to your side while in a flat bed without using bedrails?: A Lot Help needed moving from lying on your back to sitting on the side of a flat bed without using bedrails?: A Lot Help needed moving to and from a bed to a chair (including a wheelchair)?: Total Help needed standing up from a chair using your arms (e.g., wheelchair or bedside chair)?: Total Help needed to walk in hospital room?: Total Help needed climbing 3-5 steps with a railing? : Total 6 Click Score: 8    End of Session   Activity Tolerance: Patient limited by pain Patient left: in bed;with call bell/phone within reach Nurse Communication: Mobility  status       Time: 3552-1747 PT Time Calculation (min) (ACUTE ONLY): 28 min  Charges:  $Therapeutic Activity: 8-22 mins                     Mabeline Caras, PT, DPT Acute Rehabilitation Services  Pager [phone] Office [place] 08/27/2021, 1:33 PM

## 2021-08-27 NOTE — Progress Notes (Signed)
Physical Therapy Wound Treatment Patient Details  Name: [name] MRN: [id number] Date of Birth: 1965-09-12  Today's Date: 08/27/2021 Time: 9450-3888 Time Calculation (min): 44 min  Subjective  Subjective Assessment Subjective: Pt continues to report sacral and lower back pain Patient and Family Stated Goals: Home to family Date of Onset:  (unknown) Prior Treatments:  (unknown)  Pain Score:  2-4/10  premedicated  Wound Assessment  Pressure Injury 07/27/21 Sacrum Mid Unstageable - Full thickness tissue loss in which the base of the injury is covered by slough (yellow, tan, gray, green or brown) and/or eschar (tan, brown or black) in the wound bed. small pink pressure ulcer with bre (Active)  Wound Image   08/26/21 1303  Dressing Type ABD;Barrier Film (skin prep);Dakin's-soaked gauze;Gauze (Comment);Moist to dry 08/27/21 2002  Dressing Changed;Clean;Dry;Intact 08/27/21 2002  Dressing Change Frequency Daily 08/27/21 1200  State of Healing Eschar 08/27/21 2002  Site / Wound Assessment Yellow;Red;Brown 08/27/21 2002  % Wound base Red or Granulating 50% 08/27/21 2002  % Wound base Yellow/Fibrinous Exudate 45% 08/27/21 2002  % Wound base Black/Eschar 5% 08/27/21 2002  % Wound base Other/Granulation Tissue (Comment) 0% 08/27/21 2002  Peri-wound Assessment Erythema (blanchable) 08/27/21 2002  Wound Length (cm) 7 cm 08/21/21 1300  Wound Width (cm) 6 cm 08/21/21 1300  Wound Depth (cm) 1.8 cm 08/21/21 1300  Wound Surface Area (cm^2) 42 cm^2 08/21/21 1300  Wound Volume (cm^3) 75.6 cm^3 08/21/21 1300  Undermining (cm) 1.7 cm from 11:00-1:00 08/21/21 1300  Margins Unattached edges (unapproximated) 08/27/21 2002  Drainage Amount Minimal 08/27/21 2002  Drainage Description Serous;Sanguineous 08/27/21 2002  Treatment Cleansed;Debridement (Selective);Hydrotherapy (Pulse lavage);Packing (Saline gauze) 08/27/21 2002      Hydrotherapy Pulsed lavage therapy - wound location: sacral Pulsed  Lavage with Suction (psi): 8 psi (8-12) Pulsed Lavage with Suction - Normal Saline Used: 1000 mL Pulsed Lavage Tip: Tip with splash shield Selective Debridement Selective Debridement - Location: sacral Selective Debridement - Tools Used: Forceps, Scissors Selective Debridement - Tissue Removed: eschar    Wound Assessment and Plan  Wound Therapy - Assess/Plan/Recommendations Wound Therapy - Clinical Statement: Pt switched back to Dakin's yesterday. Wound bed appears to have some granulation tissue and minimal yellow slough removed today. Wound Therapy - Functional Problem List: Global weakness Factors Delaying/Impairing Wound Healing: Diabetes Mellitus, Incontinence, Infection - systemic/local, Immobility, Multiple medical problems Hydrotherapy Plan: Debridement, Dressing change, Patient/family education, Pulsatile lavage with suction Wound Therapy - Frequency: 6X / week Wound Therapy - Current Recommendations: PT Wound Therapy - Follow Up Recommendations: dressing changes by family/patient, dressing changes by RN  Wound Therapy Goals- Improve the function of patient's integumentary system by progressing the wound(s) through the phases of wound healing (inflammation - proliferation - remodeling) by: Wound Therapy Goals - Improve the function of patient's integumentary system by progressing the wound(s) through the phases of wound healing by: Decrease Necrotic Tissue to: 20% Decrease Necrotic Tissue - Progress: Progressing toward goal Increase Granulation Tissue to: 80% Increase Granulation Tissue - Progress: Progressing toward goal Goals/treatment plan/discharge plan were made with and agreed upon by patient/family: Yes Time For Goal Achievement: 7 days Wound Therapy - Potential for Goals: Good  Goals will be updated until maximal potential achieved or discharge criteria met.  Discharge criteria: when goals achieved, discharge from hospital, MD decision/surgical intervention, no progress  towards goals, refusal/missing three consecutive treatments without notification or medical reason.  GP     Charges PT Wound Care Charges $Wound Debridement up to 20 cm: < or equal  to 20 cm $ Wound Debridement each add'l 20 sqcm: 1 $PT Hydrotherapy Dressing: 1 dressing $PT PLS Gun and Tip: 1 Supply $PT Hydrotherapy Visit: 1 Visit       [name] [name] 08/27/2021, 8:05 PM 08/27/2021  [name]., PT Acute Rehabilitation Services [phone]  (pager) [phone]  (office)

## 2021-08-27 NOTE — Plan of Care (Signed)
°  Problem: Education: Goal: Knowledge of disease or condition will improve Outcome: Progressing Goal: Understanding of medication regimen will improve Outcome: Progressing   Problem: Elimination: Goal: Will not experience complications related to bowel motility Outcome: Progressing Goal: Will not experience complications related to urinary retention Outcome: Progressing   Problem: Health Behavior/Discharge Planning: Goal: Ability to safely manage health-related needs after discharge will improve Outcome: Not Progressing

## 2021-08-27 NOTE — Progress Notes (Addendum)
PROGRESS NOTE  [name]  UMP:[id number] DOB: [birth year] DOA: 07/21/2021 PCP: Patient, No Pcp Per (Inactive)   Brief Narrative: 55 year old male with history of DM-2, prior right BKA, HFpEF, hypertension, dyslipidemia presented with left diabetic foot-resulting in septic shock with group B streptococcal bacteremia.  He was initially managed in the ICU-orthopedics performed left transtibial amputation on 11/6.  Hospital course was prolonged and complicated by thoracic discitis/osteomyelitis/epidural abscess, new diagnosis of HFrEF, atrial flutter, sacral decubitus ulcer.  He was evaluated by cardiology, neurosurgery, and infectious disease.  See below for further details (note-MD assumed care from 12/21)  Subjective: Lying comfortably in bed-denies any chest pain or shortness of breath.  Pain appears stable.  Objective: Vitals: Blood pressure (!) 152/78, pulse 60, temperature 97.6 F (36.4 C), temperature source Oral, resp. rate 13, height 6\' 3"  (1.905 m), weight 107 kg, SpO2 98 %.   Physical exam: Gen Exam:Alert awake-not in any distress HEENT:atraumatic, normocephalic Chest: B/L clear to auscultation anteriorly CVS:S1S2 regular Abdomen:soft non tender, non distended Extremities: S/p bilateral BKA. Neurology: Has generalized weakness but moving all 4 extremities. Skin: no rash    Assessment & Plan: Septic shock due to left diabetic foot infection, Streptococcus agalactiae bacteremia and thoracic discitis: Sepsis physiology has resolved-did require ICU stay during early part of his hospitalization-for pressors.  Patient is now s/p left BKA.  Remains on IV vancomycin-see below.  Left diabetic foot infection: S/p left BKA on 11/16-wound VAC has been removed-Staples removed on 12/16.  Recommendations from orthopedics for dry dressing.  Group B streptococcus bacteremia: No vegetation seen on TEE-ID following with recommendations for vancomycin until 12/26-with plans to transition to  cefadroxil 1 g twice daily from 12/27 for another 4 weeks.  Acute thoracic discitis/osteomyelitis (T6-T7 )with epidural abscess Neurosurgery following-recommendations are to continue with IV antibiotics.  Repeat MRI on 12/16 showing improvement in osteomyelitis/discitis.  Will need to follow-up with neurosurgeon-Dr. Ronnald Ramp 3 weeks post discharge  Full-thickness sacral decubitus ulcer: Wound care team following-currently getting hydrotherapy.  New onset systolic heart failure: Volume status stable-continue torsemide, metoprolol and BiDil.  Per cardiology note-not a candidate for ACE/ARB/ARNI if he can renal dysfunction.  Plans are to follow with cardiology in the outpatient setting.  New onset atrial flutter-s/p TEE/DCCV on 11/12-maintaining sinus rhythm-on amiodarone/Eliquis.  Cardiology no longer following.  Will need to follow-up with cardiology in the outpatient setting.  AKI on CKD stage IIIa: AKI hemodynamically mediated-creatinine has improved and close to baseline.  Acute urinary retention: Foley catheter removed on 12/19-voiding spontaneously.  Continue Flomax.  Acute on chronic pain syndrome: Pain relatively well controlled, continue narcotics, Neurontin.  Follows with pain management-Dr. Ramo's in the outpatient setting.  Insomnia: Continue trazodone  DM-2 (A1c 13.8 on 11/15) with uncontrolled hyperglycemia: CBGs stable-continue Levemir 36 units twice daily, 6 units of NovoLog with meals and SSI.  Recent Labs    08/26/21 1618 08/26/21 2108 08/27/21 0620  GLUCAP 194* 209* 119*      Liver nodularity: Noted incidentally on CT chest 04/09/2021-stable for outpatient follow-up with PCP.   Multiple small bilateral pulmonary nodules:  Unchanged on last CT chest 61mm and smalled definitively benign, no further evaluation recommended.   Reported renal lesion: Per pt. Follows with Dr. Louis Meckel.    Pleural effusion: Chronic, multifactorial partially related to CHF and to hypoalbuminemia.  Not sufficient for thoracentesis.   Chronic testosterone use: Continue AndroGel.  Physical deconditioning due to acute illness-probably will require SNF on discharge.  Nutrition Status: Nutrition Problem: Increased nutrient needs Etiology:  wound healing Signs/Symptoms: estimated needs Interventions: MVI, Premier Protein     RN Pressure Injury Documentation: Pressure Injury 07/27/21 Sacrum Mid Unstageable - Full thickness tissue loss in which the base of the injury is covered by slough (yellow, tan, gray, green or brown) and/or eschar (tan, brown or black) in the wound bed. small pink pressure ulcer with bre (Active)  07/27/21 0255  Location: Sacrum  Location Orientation: Mid  Staging: Unstageable - Full thickness tissue loss in which the base of the injury is covered by slough (yellow, tan, gray, green or brown) and/or eschar (tan, brown or black) in the wound bed.  Wound Description (Comments): small pink pressure ulcer with break in skin 08/12/21; updated to unstageable pressure injury  Present on Admission: Yes    DVT prophylaxis: Eliquis Code Status: Full Family Communication: None at bedside. Disposition Plan:  Status is: Inpatient  Remains inpatient appropriate because: Patient is appropriate candidate for LTAC, but he was denied by insurance company.    Consultants:  ID Neurosurgery PCCM Cardiology  Procedures:  Left TTA 07/23/2021 TEE/DCCV 07/29/2021 PICC RUE 08/13/2021  Antimicrobials: Cefazolin 11/16 Ceftriaxone 11/14, 11/16-17 Clindamycin 11/15-16 Linezolid 11/15 Pen G 11/18 Pip-tazo 11/15-11/16 Vancomycin 11/14 Cefepime 12/8 - 12/12  Data Reviewed: I have personally reviewed following labs and imaging studies  CBC: Recent Labs  Lab 08/25/21 0830  WBC 6.6  HGB 14.2  HCT 44.3  MCV 93.7  PLT 657    Basic Metabolic Panel: Recent Labs  Lab 08/21/21 0905 08/25/21 0830  NA 136 135  K 4.1 4.0  CL 104 102  CO2 27 25  GLUCOSE 242* 159*  BUN  24* 18  CREATININE 1.24 1.03  CALCIUM 8.5* 8.7*    GFR: Estimated Creatinine Clearance: 107.2 mL/min (by C-G formula based on SCr of 1.03 mg/dL). Liver Function Tests: No results for input(s): AST, ALT, ALKPHOS, BILITOT, PROT, ALBUMIN in the last 168 hours.  No results for input(s): LIPASE, AMYLASE in the last 168 hours. No results for input(s): AMMONIA in the last 168 hours. Coagulation Profile: No results for input(s): INR, PROTIME in the last 168 hours. Cardiac Enzymes: No results for input(s): CKTOTAL, CKMB, CKMBINDEX, TROPONINI in the last 168 hours. BNP (last 3 results) No results for input(s): PROBNP in the last 8760 hours. HbA1C: No results for input(s): HGBA1C in the last 72 hours. CBG: Recent Labs  Lab 08/26/21 0621 08/26/21 1100 08/26/21 1618 08/26/21 2108 08/27/21 0620  GLUCAP 204* 236* 194* 209* 119*    Lipid Profile: No results for input(s): CHOL, HDL, LDLCALC, TRIG, CHOLHDL, LDLDIRECT in the last 72 hours. Thyroid Function Tests: No results for input(s): TSH, T4TOTAL, FREET4, T3FREE, THYROIDAB in the last 72 hours. Anemia Panel: No results for input(s): VITAMINB12, FOLATE, FERRITIN, TIBC, IRON, RETICCTPCT in the last 72 hours.  Urine analysis:    Component Value Date/Time   COLORURINE YELLOW 07/22/2021 0250   APPEARANCEUR CLOUDY (A) 07/22/2021 0250   LABSPEC 1.018 07/22/2021 0250   PHURINE 5.0 07/22/2021 0250   GLUCOSEU >=500 (A) 07/22/2021 0250   HGBUR SMALL (A) 07/22/2021 0250   BILIRUBINUR NEGATIVE 07/22/2021 0250   KETONESUR 5 (A) 07/22/2021 0250   PROTEINUR 30 (A) 07/22/2021 0250   UROBILINOGEN 0.2 09/13/2019 0906   NITRITE NEGATIVE 07/22/2021 0250   LEUKOCYTESUR TRACE (A) 07/22/2021 0250   No results found for this or any previous visit (from the past 240 hour(s)).    Radiology Studies: No results found.  Scheduled Meds:  acetaminophen  1,000 mg Oral TID  amiodarone  200 mg Oral Daily   apixaban  5 mg Oral BID   vitamin C  1,000 mg  Oral Daily   Chlorhexidine Gluconate Cloth  6 each Topical Daily   [START ON 09/03/2021] collagenase   Topical Daily   docusate sodium  100 mg Oral Daily   finasteride  5 mg Oral Daily   gabapentin  300 mg Oral TID   insulin aspart  0-15 Units Subcutaneous TID WC   insulin aspart  0-5 Units Subcutaneous QHS   insulin aspart  6 Units Subcutaneous TID WC   insulin detemir  36 Units Subcutaneous BID   isosorbide mononitrate  30 mg Oral Daily   methocarbamol  1,000 mg Oral Q8H   metoprolol succinate  50 mg Oral Daily   multivitamin with minerals  1 tablet Oral Daily   nystatin cream   Topical BID   oxyCODONE  15 mg Oral Q12H   Ensure Max Protein  11 oz Oral BID BM   silver nitrate applicators  1 application Topical Once   sodium chloride flush  10-40 mL Intracatheter Q12H   sodium hypochlorite   Irrigation Daily   tamsulosin  0.4 mg Oral Daily   testosterone  5 g Transdermal Daily   torsemide  20 mg Oral QODAY   Continuous Infusions:  sodium chloride Stopped (08/13/21 0457)   vancomycin 1,000 mg (08/27/21 0615)     LOS: 36 days    Oren Binet, MD [facility] [email or web address] 08/27/2021, 9:22 AM

## 2021-08-27 NOTE — TOC Progression Note (Signed)
Transition of Care [facility]) - Progression Note    Patient Details  Name: [name] MRN: [id number] Date of Birth: 1965/11/09  Transition of Care [facility]) CM/SW Contact  Zenon Mayo, RN Phone Number: 08/27/2021, 10:13 AM  Clinical Narrative:    NCM received call from [facility] with [facility] , she states they will not be able to take patient due to the location of the sacral wound and the depth, she states they do a veroflow wound care which is with water it is not hydrotherapy.  NCM will let MD know this information.   Expected Discharge Plan: [place] Barriers to Discharge: Continued Medical Work up  Expected Discharge Plan and Services Expected Discharge Plan: [place] In-house Referral: Clinical Social Work Discharge Planning Services: CM Consult, Follow-up appt scheduled Post Acute Care Choice: [place] arrangements for the past 2 months: Apartment                                       Social Determinants of Health (SDOH) Interventions    Readmission Risk Interventions No flowsheet data found.

## 2021-08-28 LAB — GLUCOSE, CAPILLARY
Glucose-Capillary: 165 mg/dL — ABNORMAL HIGH (ref 70–99)
Glucose-Capillary: 111 mg/dL — ABNORMAL HIGH (ref 70–99)
Glucose-Capillary: 116 mg/dL — ABNORMAL HIGH (ref 70–99)
Glucose-Capillary: 239 mg/dL — ABNORMAL HIGH (ref 70–99)

## 2021-08-28 LAB — BASIC METABOLIC PANEL
Anion gap: 6 (ref 5–15)
BUN: 29 mg/dL — ABNORMAL HIGH (ref 6–20)
CO2: 28 mmol/L (ref 22–32)
Calcium: 8.3 mg/dL — ABNORMAL LOW (ref 8.9–10.3)
Chloride: 97 mmol/L — ABNORMAL LOW (ref 98–111)
Creatinine, Ser: 1.27 mg/dL — ABNORMAL HIGH (ref 0.61–1.24)
GFR, Estimated: >60 mL/min (ref >60)
Glucose, Bld: 187 mg/dL — ABNORMAL HIGH (ref 70–99)
Potassium: 3.9 mmol/L (ref 3.5–5.1)
Sodium: 131 mmol/L — ABNORMAL LOW (ref 135–145)

## 2021-08-28 LAB — CBC
HCT: 36.9 % — ABNORMAL LOW (ref 39.0–52.0)
Hemoglobin: 11.9 g/dL — ABNORMAL LOW (ref 13.0–17.0)
MCH: 29 pg (ref 26.0–34.0)
MCHC: 32.2 g/dL (ref 30.0–36.0)
MCV: 90 fL (ref 80.0–100.0)
Platelets: 212 10*3/uL (ref 150–400)
RBC: 4.1 MIL/uL — ABNORMAL LOW (ref 4.22–5.81)
RDW: 14.7 % (ref 11.5–15.5)
WBC: 7.6 10*3/uL (ref 4.0–10.5)
nRBC: 0 % (ref 0.0–0.2)

## 2021-08-28 LAB — VANCOMYCIN, TROUGH: Vancomycin Tr: 14 ug/mL — ABNORMAL LOW (ref 15–20)

## 2021-08-28 MED ORDER — POLYETHYLENE GLYCOL 3350 17 G PO PACK
17.0000 g | PACK | Freq: Every day | ORAL | Status: DC
  Administered 2021-08-28 – 2021-09-06 (×10): 17 g via ORAL
  Filled 2021-08-28 (×10): qty 1

## 2021-08-28 MED ORDER — BISACODYL 10 MG RE SUPP
10.0000 mg | Freq: Every day | RECTAL | Status: DC | PRN
  Administered 2021-09-02: 21:00:00 10 mg via RECTAL
  Filled 2021-08-28 (×2): qty 1

## 2021-08-28 MED ORDER — GABAPENTIN 400 MG PO CAPS
400.0000 mg | ORAL_CAPSULE | Freq: Three times a day (TID) | ORAL | Status: DC
  Administered 2021-08-28 – 2021-09-09 (×38): 400 mg via ORAL
  Filled 2021-08-28 (×38): qty 1

## 2021-08-28 MED ORDER — OXYCODONE HCL ER 20 MG PO T12A
20.0000 mg | EXTENDED_RELEASE_TABLET | Freq: Two times a day (BID) | ORAL | Status: DC
  Administered 2021-08-28 – 2021-09-03 (×12): 20 mg via ORAL
  Filled 2021-08-28 (×6): qty 2
  Filled 2021-08-28: qty 1
  Filled 2021-08-28: qty 2
  Filled 2021-08-28 (×3): qty 1
  Filled 2021-08-28: qty 2

## 2021-08-28 MED ORDER — SENNOSIDES-DOCUSATE SODIUM 8.6-50 MG PO TABS
2.0000 | ORAL_TABLET | Freq: Every evening | ORAL | Status: DC | PRN
  Administered 2021-09-02: 20:00:00 2 via ORAL
  Filled 2021-08-28 (×2): qty 2

## 2021-08-28 NOTE — Plan of Care (Signed)
°  Problem: Activity: Goal: Ability to tolerate increased activity will improve Outcome: Progressing   Problem: Cardiac: Goal: Ability to achieve and maintain adequate cardiopulmonary perfusion will improve Outcome: Progressing   Problem: Pain Managment: Goal: General experience of comfort will improve Outcome: Progressing

## 2021-08-28 NOTE — Progress Notes (Signed)
Physical Therapy Wound Treatment Patient Details  Name: [name] MRN: [id number] Date of Birth: Sep 09, 1965  Today's Date: 08/28/2021 Time: 6440-3474 Time Calculation (min): 45 min  Subjective  Subjective Assessment Subjective: Pt continues to report sacral and lower back pain Patient and Family Stated Goals: Home to family Date of Onset:  (unknown) Prior Treatments:  (unknown)  Pain Score:  4/10 premedicated   Wound Assessment  Pressure Injury 07/27/21 Sacrum Mid Unstageable - Full thickness tissue loss in which the base of the injury is covered by slough (yellow, tan, gray, green or brown) and/or eschar (tan, brown or black) in the wound bed. small pink pressure ulcer with bre (Active)  Wound Image   08/28/21 1100  Dressing Type ABD;Barrier Film (skin prep);Gauze (Comment);Dakin's-soaked gauze 08/28/21 1100  Dressing Changed;Clean;Dry;Intact 08/28/21 1100  Dressing Change Frequency Daily 08/27/21 1200  State of Healing Eschar 08/28/21 1100  Site / Wound Assessment Yellow;Red 08/28/21 1100  % Wound base Red or Granulating 45% 08/28/21 1100  % Wound base Yellow/Fibrinous Exudate 55% 08/28/21 1100  % Wound base Black/Eschar 0% 08/28/21 1100  % Wound base Other/Granulation Tissue (Comment) 0% 08/28/21 1100  Peri-wound Assessment Erythema (blanchable) 08/28/21 1100  Wound Length (cm) 7 cm 08/28/21 1100  Wound Width (cm) 7 cm 08/28/21 1100  Wound Depth (cm) 2 cm 08/28/21 1100  Wound Surface Area (cm^2) 49 cm^2 08/28/21 1100  Wound Volume (cm^3) 98 cm^3 08/28/21 1100  Undermining (cm) 3 cm at 3 oclock, 1.5 cm at 12 oclock 08/28/21 1100  Margins Unattached edges (unapproximated) 08/28/21 1100  Drainage Amount Minimal 08/28/21 1100  Drainage Description Sanguineous 08/28/21 1100  Treatment Cleansed;Debridement (Selective);Hydrotherapy (Pulse lavage) 08/28/21 1100      Hydrotherapy Pulsed lavage therapy - wound location: sacral Pulsed Lavage with Suction (psi): 8 psi  (8-12) Pulsed Lavage with Suction - Normal Saline Used: 1000 mL Pulsed Lavage Tip: Tip with splash shield Selective Debridement Selective Debridement - Location: sacral Selective Debridement - Tools Used: Forceps, Scissors Selective Debridement - Tissue Removed: eschar    Wound Assessment and Plan  Wound Therapy - Assess/Plan/Recommendations Wound Therapy - Clinical Statement: Pt switched back to Dakin's yesterday. Wound bed appears to have some granulation tissue and minimal yellow slough removed today. Wound Therapy - Functional Problem List: Global weakness Factors Delaying/Impairing Wound Healing: Diabetes Mellitus, Incontinence, Infection - systemic/local, Immobility, Multiple medical problems Hydrotherapy Plan: Debridement, Dressing change, Patient/family education, Pulsatile lavage with suction Wound Therapy - Frequency: 6X / week Wound Therapy - Current Recommendations: PT Wound Therapy - Follow Up Recommendations: dressing changes by family/patient, dressing changes by RN  Wound Therapy Goals- Improve the function of patient's integumentary system by progressing the wound(s) through the phases of wound healing (inflammation - proliferation - remodeling) by: Wound Therapy Goals - Improve the function of patient's integumentary system by progressing the wound(s) through the phases of wound healing by: Decrease Necrotic Tissue to: 20% Decrease Necrotic Tissue - Progress: Progressing toward goal Increase Granulation Tissue to: 80% Increase Granulation Tissue - Progress: Progressing toward goal Goals/treatment plan/discharge plan were made with and agreed upon by patient/family: Yes Time For Goal Achievement: 7 days Wound Therapy - Potential for Goals: Good  Goals will be updated until maximal potential achieved or discharge criteria met.  Discharge criteria: when goals achieved, discharge from hospital, MD decision/surgical intervention, no progress towards goals, refusal/missing  three consecutive treatments without notification or medical reason.  GP     Charges PT Wound Care Charges $Wound Debridement up to 20 cm: <  or equal to 20 cm $ Wound Debridement each add'l 20 sqcm: 1 $PT Hydrotherapy Dressing: 1 dressing $PT PLS Gun and Tip: 1 Supply $PT Hydrotherapy Visit: 1 Visit       [name] Jhade Berko 08/28/2021, 11:03 AM 08/28/2021  [name]., PT Acute Rehabilitation Services [phone]  (pager) [phone]  (office)

## 2021-08-28 NOTE — Progress Notes (Addendum)
PROGRESS NOTE  [name]  [id number] DOB: Aug 25, 1966 DOA: 07/21/2021 PCP: Patient, No Pcp Per (Inactive)   Brief Narrative: 55 year old male with history of DM-2, prior right BKA, HFpEF, hypertension, dyslipidemia presented with left diabetic foot-resulting in septic shock with group B streptococcal bacteremia.  He was initially managed in the ICU-orthopedics performed left transtibial amputation on 11/6.  Hospital course was prolonged and complicated by thoracic discitis/osteomyelitis/epidural abscess, new diagnosis of HFrEF, atrial flutter, sacral decubitus ulcer.  He was evaluated by cardiology, neurosurgery, and infectious disease.  See below for further details.(note-MD assumed care from 12/21)  Subjective: Frustrated about his sacral wound-complaining of pain.  Also very frustrated about discharge disposition-he does not want to go to SNF because he feels that he may lose his Social Security benefits.  Objective: Vitals: Blood pressure 115/62, pulse 77, temperature 97.7 F (36.5 C), temperature source Oral, resp. rate 14, height 6\' 3"  (1.905 m), weight 107 kg, SpO2 93 %.   Physical exam: Gen Exam:Alert awake-not in any distress HEENT:atraumatic, normocephalic Chest: B/L clear to auscultation anteriorly CVS:S1S2 regular Abdomen:soft non tender, non distended Extremities: S/p bilateral BKA. Neurology: Non focal-but with generalized weakness. Skin: no rash    Assessment & Plan: Septic shock due to left diabetic foot infection, Streptococcus agalactiae bacteremia and thoracic discitis: Sepsis physiology has resolved-did require ICU stay during the early part of this hospitalization for pressors.  Remains on IV vancomycin-patient is now s/p left BKA.    Left diabetic foot infection: S/p left BKA on 11/16-wound VAC has been removed-Staples removed on 12/16.  Recommendations from orthopedics for dry dressing.  Group B streptococcus bacteremia: No vegetation seen on TEE-repeat  blood cultures on 11/17 negative so far.  ID following-recommendations are to continue IV vancomycin until 12/26 with plans to transition to cefadroxil 1 g twice daily from 12/27 for 4 weeks.   Acute thoracic discitis/osteomyelitis (T6-T7 )with epidural abscess Neurosurgery following-recommendations are to continue with IV antibiotics-Per neurosurgery-no need for surgery given repeat MRI on 12/16 showed improvement.  Will need outpatient follow-up with Dr. Ronnald Ramp in 3 weeks.  Full-thickness sacral decubitus ulcer: Wound care team following-currently getting hydrotherapy.  New onset systolic heart failure: Volume status stable-continue torsemide, metoprolol and BiDil.  Per cardiology note-not a candidate for ACE/ARB/ARNI if he can renal dysfunction.  Plans are to follow with cardiology in the outpatient setting.  New onset atrial flutter-s/p TEE/DCCV on 11/12-maintaining sinus rhythm-on amiodarone/Eliquis.  Cardiology no longer following.  Will need to follow-up with cardiology in the outpatient setting.  AKI on CKD stage IIIa: AKI hemodynamically mediated-creatinine has improved and close to baseline.  Acute urinary retention: Foley catheter removed on 12/19-voiding spontaneously.  Continue Flomax.  Acute on chronic pain syndrome: Continues to have more back pain and pain from his sacral decubitus ulcer-increase Neurontin to 400 mg 3 times daily, increase OxyContin to 20 mg twice daily-continue to use oxycodone and Dilaudid for breakthrough pain.  Follows with pain management-Dr. Nelva Bush in the outpatient setting.  On bowel regimen with MiraLAX and other as needed laxatives.  Insomnia: Continue trazodone  DM-2 (A1c 13.8 on 11/15) with uncontrolled hyperglycemia: CBGs stable-continue Levemir 36 units twice daily, 6 units of NovoLog with meals and SSI.  Recent Labs    08/27/21 1553 08/27/21 2112 08/28/21 0631  GLUCAP 170* 95 165*       Liver nodularity: Noted incidentally on CT chest  04/09/2021-stable for outpatient follow-up with PCP.   Multiple small bilateral pulmonary nodules:  Unchanged on last CT chest 56mm  and smalled definitively benign, no further evaluation recommended.   Reported renal lesion: Per pt. Follows with Dr. Louis Meckel.    Pleural effusion: Chronic, multifactorial partially related to CHF and to hypoalbuminemia. Not sufficient for thoracentesis.   Chronic testosterone use: Continue AndroGel.  Physical deconditioning due to acute illness-probably will require SNF on discharge.  Nutrition Status: Nutrition Problem: Increased nutrient needs Etiology: wound healing Signs/Symptoms: estimated needs Interventions: MVI, Premier Protein     RN Pressure Injury Documentation: Pressure Injury 07/27/21 Sacrum Mid Unstageable - Full thickness tissue loss in which the base of the injury is covered by slough (yellow, tan, gray, green or brown) and/or eschar (tan, brown or black) in the wound bed. small pink pressure ulcer with bre (Active)  07/27/21 0255  Location: Sacrum  Location Orientation: Mid  Staging: Unstageable - Full thickness tissue loss in which the base of the injury is covered by slough (yellow, tan, gray, green or brown) and/or eschar (tan, brown or black) in the wound bed.  Wound Description (Comments): small pink pressure ulcer with break in skin 08/12/21; updated to unstageable pressure injury  Present on Admission: Yes    DVT prophylaxis: Eliquis Code Status: Full Family Communication: None at bedside. Disposition Plan:  Status is: Inpatient  Remains inpatient appropriate because: Needs ongoing wound care with hydrotherapy-denied by LTAC-not a candidate for inpatient rehab-we will eventually need SNF.  Discussed with patient on 12/22-he is reluctant but does understand.  Consultants:  ID Neurosurgery PCCM Cardiology  Procedures:  Left TTA 07/23/2021 TEE/DCCV 07/29/2021 PICC RUE 08/13/2021  Antimicrobials: Cefazolin  11/16 Ceftriaxone 11/14, 11/16-17 Clindamycin 11/15-16 Linezolid 11/15 Pen G 11/18 Pip-tazo 11/15-11/16 Vancomycin 11/14 Cefepime 12/8 - 12/12  Data Reviewed: I have personally reviewed following labs and imaging studies  CBC: Recent Labs  Lab 08/25/21 0830 08/28/21 0517  WBC 6.6 7.6  HGB 14.2 11.9*  HCT 44.3 36.9*  MCV 93.7 90.0  PLT 151 778    Basic Metabolic Panel: Recent Labs  Lab 08/25/21 0830 08/28/21 0517  NA 135 131*  K 4.0 3.9  CL 102 97*  CO2 25 28  GLUCOSE 159* 187*  BUN 18 29*  CREATININE 1.03 1.27*  CALCIUM 8.7* 8.3*    GFR: Estimated Creatinine Clearance: 86.9 mL/min (A) (by C-G formula based on SCr of 1.27 mg/dL (H)). Liver Function Tests: No results for input(s): AST, ALT, ALKPHOS, BILITOT, PROT, ALBUMIN in the last 168 hours.  No results for input(s): LIPASE, AMYLASE in the last 168 hours. No results for input(s): AMMONIA in the last 168 hours. Coagulation Profile: No results for input(s): INR, PROTIME in the last 168 hours. Cardiac Enzymes: No results for input(s): CKTOTAL, CKMB, CKMBINDEX, TROPONINI in the last 168 hours. BNP (last 3 results) No results for input(s): PROBNP in the last 8760 hours. HbA1C: No results for input(s): HGBA1C in the last 72 hours. CBG: Recent Labs  Lab 08/27/21 0620 08/27/21 1118 08/27/21 1553 08/27/21 2112 08/28/21 0631  GLUCAP 119* 161* 170* 95 165*    Lipid Profile: No results for input(s): CHOL, HDL, LDLCALC, TRIG, CHOLHDL, LDLDIRECT in the last 72 hours. Thyroid Function Tests: No results for input(s): TSH, T4TOTAL, FREET4, T3FREE, THYROIDAB in the last 72 hours. Anemia Panel: No results for input(s): VITAMINB12, FOLATE, FERRITIN, TIBC, IRON, RETICCTPCT in the last 72 hours.  Urine analysis:    Component Value Date/Time   COLORURINE YELLOW 07/22/2021 0250   APPEARANCEUR CLOUDY (A) 07/22/2021 0250   LABSPEC 1.018 07/22/2021 0250   PHURINE 5.0 07/22/2021 0250  GLUCOSEU >=500 (A) 07/22/2021  0250   HGBUR SMALL (A) 07/22/2021 0250   BILIRUBINUR NEGATIVE 07/22/2021 0250   KETONESUR 5 (A) 07/22/2021 0250   PROTEINUR 30 (A) 07/22/2021 0250   UROBILINOGEN 0.2 09/13/2019 0906   NITRITE NEGATIVE 07/22/2021 0250   LEUKOCYTESUR TRACE (A) 07/22/2021 0250   No results found for this or any previous visit (from the past 240 hour(s)).    Radiology Studies: No results found.  Scheduled Meds:  acetaminophen  1,000 mg Oral TID   amiodarone  200 mg Oral Daily   apixaban  5 mg Oral BID   vitamin C  1,000 mg Oral Daily   Chlorhexidine Gluconate Cloth  6 each Topical Daily   [START ON 09/03/2021] collagenase   Topical Daily   docusate sodium  100 mg Oral Daily   finasteride  5 mg Oral Daily   gabapentin  300 mg Oral TID   insulin aspart  0-15 Units Subcutaneous TID WC   insulin aspart  0-5 Units Subcutaneous QHS   insulin aspart  6 Units Subcutaneous TID WC   insulin detemir  36 Units Subcutaneous BID   isosorbide mononitrate  30 mg Oral Daily   methocarbamol  1,000 mg Oral Q8H   metoprolol succinate  50 mg Oral Daily   multivitamin with minerals  1 tablet Oral Daily   nystatin cream   Topical BID   oxyCODONE  15 mg Oral Q12H   Ensure Max Protein  11 oz Oral BID BM   silver nitrate applicators  1 application Topical Once   sodium chloride flush  10-40 mL Intracatheter Q12H   sodium hypochlorite   Irrigation Daily   tamsulosin  0.4 mg Oral Daily   testosterone  5 g Transdermal Daily   torsemide  20 mg Oral QODAY   Continuous Infusions:  sodium chloride 10 mL/hr at 08/28/21 1779   vancomycin 1,000 mg (08/28/21 0629)     LOS: 37 days    Oren Binet, MD [facility] [email or web address] 08/28/2021, 9:54 AM

## 2021-08-28 NOTE — Progress Notes (Signed)
Pharmacy Antibiotic Note  [name] is a 55 y.o. male admitted on 07/21/2021 with GBS bacteremia related to L-DFI now s/p trans-tib amputation with progressive discitis/osteo on Vancomycin per patient preference.   Cefepime started 12/8 for worsening sacral pressure wound. Completed 7 days of cefepime as of 08/21/21>  cefepime discontinued 12/15 per ID.   SCr this am is stable, vancomycin trough is 14 mcg/ml and consistent with prior therapeutic AUC troughs.   Plan: -Continue Vancomycin 1000mg  q12h  -Follow Cr  Height: 6\' 3"  (190.5 cm) Weight: 107 kg (235 lb 14.3 oz) IBW/kg (Calculated) : 84.5  Temp (24hrs), Avg:98 F (36.7 C), Min:97.6 F (36.4 C), Max:98.5 F (36.9 C)  Recent Labs  Lab 08/21/21 0905 08/21/21 1358 08/25/21 0830 08/28/21 0517  WBC  --   --  6.6 7.6  CREATININE 1.24  --  1.03 1.27*  VANCOTROUGH  --  18  --  14*  VANCOPEAK 26*  --   --   --      Estimated Creatinine Clearance: 86.9 mL/min (A) (by C-G formula based on SCr of 1.27 mg/dL (H)).    Allergies  Allergen Reactions   Bactrim [Sulfamethoxazole-Trimethoprim]    Ceprotin [Protein C Concentrate (Human)]    Ciprofloxacin Other (See Comments)    Kidney function   Levaquin [Levofloxacin]    11/14 Ceftriaxone x1 11/14 Vancomycin >> 11/15, 12/1>>  (plan x 8 weeks) 12/1 Cefepime>>12/5  ; 12/8 > 12/15 11/15 Zosyn >> 11/16 11/15 Clindamycin >>11/15 11/15 Linezolid>> 11/16 11/16 Rocephin>>11/18 11/18 Penicillin G>>12/1 by ID (2 weeks IV therapy, then 4-6 weeks amox until 09/14/21) 12/2 acyclovir for 7 days > 12/9  Vanc levels and antibiotic dose adjustments: 12/3 VP = 24; VT = 12 - AUC = 484 on 750mg  q8 12/9 VT 14>>continue 750 q8hr 12/15 VT = 22 @ 9407 (early) ? If accurate. Will redraw 12/15 VP = 26 @ 0905  (2 hr post end of  infus/dose given 0559 750mg  q8h) Scr 1.24 12/15 VT = 18 @ 1358 (calc AUC 570,, SCr 1.24) > adjusted dose to 1000 mg q12h , estAUC 506, Cmax 30.6, Cmin 13.5)  12/22 VT =  14  11/17 BCx: ngF 11/16 MRS PCR + 11/14 BCx: GBS pan S 11/15 UCx: GBS    Arrie Senate, PharmD, BCPS, [facility] Clinical Pharmacist Please check AMION for all [facility] Pharmacy numbers 08/28/2021

## 2021-08-29 DIAGNOSIS — M4644 Discitis, unspecified, thoracic region: Secondary | ICD-10-CM | POA: Diagnosis not present

## 2021-08-29 DIAGNOSIS — A419 Sepsis, unspecified organism: Secondary | ICD-10-CM | POA: Diagnosis not present

## 2021-08-29 DIAGNOSIS — I4892 Unspecified atrial flutter: Secondary | ICD-10-CM | POA: Diagnosis not present

## 2021-08-29 DIAGNOSIS — N179 Acute kidney failure, unspecified: Secondary | ICD-10-CM | POA: Diagnosis not present

## 2021-08-29 LAB — GLUCOSE, CAPILLARY
Glucose-Capillary: 107 mg/dL — ABNORMAL HIGH (ref 70–99)
Glucose-Capillary: 189 mg/dL — ABNORMAL HIGH (ref 70–99)
Glucose-Capillary: 253 mg/dL — ABNORMAL HIGH (ref 70–99)
Glucose-Capillary: 210 mg/dL — ABNORMAL HIGH (ref 70–99)

## 2021-08-29 MED ORDER — CEFADROXIL 500 MG PO CAPS
1000.0000 mg | ORAL_CAPSULE | Freq: Two times a day (BID) | ORAL | Status: DC
  Administered 2021-09-02 – 2021-09-09 (×16): 1000 mg via ORAL
  Filled 2021-08-29 (×17): qty 2

## 2021-08-29 NOTE — Progress Notes (Addendum)
Physical Therapy Treatment Patient Details Name: Adelaido Nicklaus MRN: [id number] DOB: 03-19-1966 Today's Date: 08/29/2021   History of Present Illness Pt is a 55 y.o. 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 S/p TEE, DCCV on 11/22. Chest CT 12/4 with pleural effusion; thoracentesis attempted with insufficient fluid volume. PMH includes R BKA, aflutter, DM, HTN, depression, anxiety.    PT Comments    PT/OT returned to patient's room following 30 minutes of sitting up in recliner on Roho cushion. Patient stated sitting became increasing more intense following 10 minutes of sitting up and asked to return to bed. Patient was mod/max assist +2 for lateral scoot transfer due to increased pain in back and at wound side, reporting muscle spasms. Patient required assistance to return to supine and was able to assist with positioning once in bed. Will continue to follow acutely. Current recommendations remain appropriate. Goals updated.    Recommendations for follow up therapy are one component of a multi-disciplinary discharge planning process, led by the attending physician.  Recommendations may be updated based on patient status, additional functional criteria and insurance authorization.  Follow Up Recommendations  Acute inpatient rehab (3hours/day) ([facility] declined, thus if not inpt rehab then recommend SNF if pt agreeable to it)     Assistance Recommended at Discharge Frequent or constant Supervision/Assistance  Equipment Recommendations  Wheelchair (measurements PT);Wheelchair cushion (measurements PT);Hospital bed;Other (comment);BSC/3in1 (hoyer lift; all equipment with drop arms and for bil amputee; tilt w/c for pressure relief with sacral relief cushion)     Recommendations for Other Services       Precautions / Restrictions Precautions Precautions: Fall;Back Precaution Booklet Issued: No Precaution Comments: Back precautions for comfort Required Braces or Orthoses: Other Brace Other Brace: LLE limb guard; has RLE prosthetic in room (not fitting currently per pt?) Restrictions Weight Bearing Restrictions: Yes LLE Weight Bearing: Non weight bearing Other Position/Activity Restrictions: RLE prosthetic is not fitting currently per pt     Mobility  Bed Mobility Overal bed mobility: Needs Assistance Bed Mobility: Sit to Supine     Supine to sit: Min guard;+2 for safety/equipment;HOB elevated Sit to supine: Mod assist;+2 for physical assistance   General bed mobility comments: patient transferred with [place] down and was able to assist with positoning with rails and trapeze    Transfers Overall transfer level: Needs assistance Equipment used: None Transfers: Bed to chair/wheelchair/BSC            Lateral/Scoot Transfers: Max assist;Mod assist;+2 physical assistance;+2 safety/equipment General transfer comment: patient required increased assistance to return to bed from recliner due to pain and back cramps    Ambulation/Gait               General Gait Details: Unable   Stairs             Wheelchair Mobility    Modified Rankin (Stroke Patients Only)       Balance Overall balance assessment: Needs assistance Sitting-balance support: Single extremity supported;Bilateral upper extremity supported Sitting balance-Leahy Scale: Poor Sitting balance - Comments: reliant on assistance for balance when returning to bed due to pain       Standing balance comment: Unable  Cognition Arousal/Alertness: Awake/alert Behavior During Therapy: WFL for tasks assessed/performed Overall Cognitive Status: No family/caregiver present to determine baseline cognitive functioning Area of  Impairment: Attention;Safety/judgement;Awareness;Problem solving                   Current Attention Level: Selective     Safety/Judgement: Decreased awareness of deficits Awareness: Emergent Problem Solving: Requires verbal cues;Difficulty sequencing General Comments: Patient adamently wanting to go back to bed due to pain at wound site        Exercises     General Comments        Pertinent Vitals/Pain Pain Assessment: Faces Faces Pain Scale: Hurts even more Pain Location: back and wound site Pain Descriptors / Indicators: Grimacing;Guarding;Moaning Pain Intervention(s): Limited activity within patient's tolerance;Monitored during session;Repositioned;Premedicated before session    Home Living                          Prior Function            PT Goals (current goals can now be found in the care plan section) Acute Rehab PT Goals Patient Stated Goal: to get back to bed PT Goal Formulation: With patient Time For Goal Achievement: 09/12/21 Potential to Achieve Goals: Fair Progress towards PT goals: Progressing toward goals    Frequency    Min 3X/week      PT Plan Current plan remains appropriate    Co-evaluation PT/OT/SLP Co-Evaluation/Treatment: Yes Reason for Co-Treatment: Complexity of the patient's impairments (multi-system involvement);For patient/therapist safety;To address functional/ADL transfers PT goals addressed during session: Mobility/safety with mobility;Balance OT goals addressed during session: Strengthening/ROM      AM-PAC PT "6 Clicks" Mobility   Outcome Measure  Help needed turning from your back to your side while in a flat bed without using bedrails?: A Little Help needed moving from lying on your back to sitting on the side of a flat bed without using bedrails?: A Lot Help needed moving to and from a bed to a chair (including a wheelchair)?: Total Help needed standing up from a chair using your arms (e.g.,  wheelchair or bedside chair)?: Total Help needed to walk in hospital room?: Total Help needed climbing 3-5 steps with a railing? : Total 6 Click Score: 9    End of Session   Activity Tolerance: Patient limited by pain Patient left: in bed;with call bell/phone within reach Nurse Communication: Mobility status PT Visit Diagnosis: Other abnormalities of gait and mobility (R26.89);Muscle weakness (generalized) (M62.81);Pain Pain - part of body:  (back and buttocks)     Time: 1610-9604 PT Time Calculation (min) (ACUTE ONLY): 14 min  Charges:  N/A                     Moishe Spice, PT, DPT Acute Rehabilitation Services  Pager: [phone] Office: [place] 08/29/2021, 6:06 PM

## 2021-08-29 NOTE — Progress Notes (Signed)
Physical Therapy Wound Treatment Patient Details  Name: [name] MRN: [id number] Date of Birth: 03/25/1966  Today's Date: 08/29/2021 Time: 6861-6837 Time Calculation (min): 28 min  Subjective  Subjective Assessment Subjective: Pt continues to report sacral and lower back pain Patient and Family Stated Goals: Home to family Date of Onset:  (unknown) Prior Treatments:  (unknown)  Pain Score:  2-4/10  with premedication  Wound Assessment  Pressure Injury 07/27/21 Sacrum Mid Unstageable - Full thickness tissue loss in which the base of the injury is covered by slough (yellow, tan, gray, green or brown) and/or eschar (tan, brown or black) in the wound bed. small pink pressure ulcer with bre (Active)  Wound Image   08/28/21 1100  Dressing Type ABD;Barrier Film (skin prep);Gauze (Comment);Dakin's-soaked gauze;Moist to dry 08/29/21 1033  Dressing Clean;Dry;Intact 08/29/21 1033  Dressing Change Frequency Daily 08/29/21 1033  State of Healing Early/partial granulation 08/29/21 1033  Site / Wound Assessment Red;Yellow;Pale 08/29/21 1033  % Wound base Red or Granulating 45% 08/29/21 1033  % Wound base Yellow/Fibrinous Exudate 55% 08/29/21 1033  % Wound base Black/Eschar 0% 08/29/21 1033  % Wound base Other/Granulation Tissue (Comment) 0% 08/28/21 1100  Peri-wound Assessment Erythema (blanchable) 08/29/21 1033  Wound Length (cm) 7 cm 08/28/21 1100  Wound Width (cm) 7 cm 08/28/21 1100  Wound Depth (cm) 2 cm 08/28/21 1100  Wound Surface Area (cm^2) 49 cm^2 08/28/21 1100  Wound Volume (cm^3) 98 cm^3 08/28/21 1100  Undermining (cm) 3 cm at 3 oclock, 1.5 cm at 12 oclock 08/28/21 1100  Margins Unattached edges (unapproximated) 08/29/21 1033  Drainage Amount Minimal 08/29/21 1033  Drainage Description Serous;Green;Other (Comment) 08/29/21 1033  Treatment Cleansed;Debridement (Selective);Hydrotherapy (Pulse lavage) 08/29/21 1033      Hydrotherapy Pulsed lavage therapy - wound location:  sacral Pulsed Lavage with Suction (psi): 8 psi (8-12) Pulsed Lavage with Suction - Normal Saline Used: 1000 mL Pulsed Lavage Tip: Tip with splash shield Selective Debridement Selective Debridement - Location: sacral Selective Debridement - Tools Used: Forceps, Scissors Selective Debridement - Tissue Removed: eschar    Wound Assessment and Plan  Wound Therapy - Assess/Plan/Recommendations Wound Therapy - Clinical Statement: Still with some bluish green exudate.  Would recommend continuing DAKIN.Marland Kitchen Wound bed appears to have some granulation tissue and minimal yellow slough removed today. Wound Therapy - Functional Problem List: Global weakness Factors Delaying/Impairing Wound Healing: Diabetes Mellitus, Incontinence, Infection - systemic/local, Immobility, Multiple medical problems Hydrotherapy Plan: Debridement, Dressing change, Patient/family education, Pulsatile lavage with suction Wound Therapy - Frequency: 6X / week Wound Therapy - Current Recommendations: PT Wound Therapy - Follow Up Recommendations: dressing changes by family/patient, dressing changes by RN  Wound Therapy Goals- Improve the function of patient's integumentary system by progressing the wound(s) through the phases of wound healing (inflammation - proliferation - remodeling) by: Wound Therapy Goals - Improve the function of patient's integumentary system by progressing the wound(s) through the phases of wound healing by: Decrease Necrotic Tissue to: 20% Decrease Necrotic Tissue - Progress: Progressing toward goal Increase Granulation Tissue to: 80% Increase Granulation Tissue - Progress: Progressing toward goal Goals/treatment plan/discharge plan were made with and agreed upon by patient/family: Yes Time For Goal Achievement: 7 days Wound Therapy - Potential for Goals: Good  Goals will be updated until maximal potential achieved or discharge criteria met.  Discharge criteria: when goals achieved, discharge from  hospital, MD decision/surgical intervention, no progress towards goals, refusal/missing three consecutive treatments without notification or medical reason.  GP     Charges PT  Wound Care Charges $Wound Debridement up to 20 cm: < or equal to 20 cm $ Wound Debridement each add'l 20 sqcm: 1 $PT PLS Gun and Tip: 1 Supply $PT Hydrotherapy Visit: 1 Visit       [name] 08/29/2021, 10:35 AM 08/29/2021  [name]., PT Acute Rehabilitation Services [phone]  (pager) [phone]  (office)

## 2021-08-29 NOTE — TOC Progression Note (Addendum)
Transition of Care [facility]) - Progression Note    Patient Details  Name: [name] MRN: [id number] Date of Birth: [birth year]  Transition of Care [facility]) CM/SW [place], [place] Phone Number: 08/29/2021, 12:33 PM  Clinical Narrative:    CSW spoke with pt about SNF for short term rehab. Pt stated he thought he would have to go to a long term nursing home and loose his check. CSW explained in detail what the SNF is and explained that it will not interfere with his SSI. CSW explained that there is a high chance that he does not get an offer because of the hydrotherapy, if any. Pt stated that he understood. CSW faxed pt out and will follow up on SNF choices once received.   Pt's hydrotherapy is a barrier for Snf, CSW reached out to MD who stated pt is in need of hydrotherapy and will be done next week. CSW will refax pt after hydrotherapy is complete.   Expected Discharge Plan: [place] Barriers to Discharge: Continued Medical Work up  Expected Discharge Plan and Services Expected Discharge Plan: [place] In-house Referral: Clinical Social Work Discharge Planning Services: CM Consult, Follow-up appt scheduled Post Acute Care Choice: [place] arrangements for the past 2 months: Apartment                                       Social Determinants of Health (SDOH) Interventions    Readmission Risk Interventions No flowsheet data found.

## 2021-08-29 NOTE — Progress Notes (Signed)
Occupational Therapy Treatment Patient Details Name: [name] MRN: [id number] DOB: 1966-01-11 Today's Date: 08/29/2021   History of present illness Pt is a 55 y.o. 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 S/p TEE, DCCV on 11/22. Chest CT 12/4 with pleural effusion; thoracentesis attempted with insufficient fluid volume. PMH includes R BKA, aflutter, DM, HTN, depression, anxiety.   OT comments  Patient is making good gains this treatment session with patient able to go from supine to sitting on EOB with use of elevating HOB, min guard and increased time. Transfer performed to recliner with ROHO cushion with lateral scoot transfer and min assist +2.  Patient states comfort with ROHO and positioned in recliner. Acute OT to continue to follow.    Recommendations for follow up therapy are one component of a multi-disciplinary discharge planning process, led by the attending physician.  Recommendations may be updated based on patient status, additional functional criteria and insurance authorization.    Follow Up Recommendations  Skilled nursing-short term rehab (<3 hours/day)    Assistance Recommended at Discharge Frequent or constant Supervision/Assistance  Equipment Recommendations  None recommended by OT    Recommendations for Other Services      Precautions / Restrictions Precautions Precautions: Fall;Back Precaution Booklet Issued: No Precaution Comments: Back precautions for comfort Required Braces or Orthoses: Other Brace Other Brace: LLE limb guard; has RLE prosthetic in room (not fitting currently per pt?) Restrictions Weight Bearing Restrictions: Yes LLE Weight Bearing: Non weight bearing Other Position/Activity Restrictions: RLE prosthetic is not  fitting currently per pt       Mobility Bed Mobility Overal bed mobility: Needs Assistance Bed Mobility: Supine to Sit     Supine to sit: Min guard;+2 for safety/equipment;HOB elevated     General bed mobility comments: HOB elevated significantly with pt propped on R elbow to ascend trunk to sit R EOB, min guard +2 for safety.    Transfers Overall transfer level: Needs assistance Equipment used: None Transfers: Bed to chair/wheelchair/BSC            Lateral/Scoot Transfers: Min assist;+2 physical assistance;+2 safety/equipment General transfer comment: Pt needing cues to pivot buttocks to align self with chair for lateral scoot to R bed > recliner with L arm rest dropped on recliner. MinAx2 with use of bed pad to assist in elevating buttocks for final transfer between surfaces.     Balance Overall balance assessment: Needs assistance Sitting-balance support: Single extremity supported;Bilateral upper extremity supported Sitting balance-Leahy Scale: Poor Sitting balance - Comments: Pt reliant on 1-2 UE support to sit statically EOB with min guard assist.       Standing balance comment: Unable                           ADL either performed or assessed with clinical judgement   ADL                                              Extremity/Trunk Assessment              Vision       Perception     Praxis  Cognition Arousal/Alertness: Awake/alert Behavior During Therapy: WFL for tasks assessed/performed Overall Cognitive Status: No family/caregiver present to determine baseline cognitive functioning Area of Impairment: Attention;Safety/judgement;Awareness;Problem solving                   Current Attention Level: Selective     Safety/Judgement: Decreased awareness of deficits Awareness: Emergent Problem Solving: Requires verbal cues;Difficulty sequencing General Comments: Pt easily distracted and changes subject  often, needs redirecting at times. Pt with some poor insight into exercises needed or techniques to perform to progress. Pt needing step by step directing for sequencing transfer OOB.          Exercises Exercises: Other exercises Other Exercises Other Exercises: Modified sit-ups from reclined reclinder, modAx2 to clear scapulas, 10x Other Exercises: educated pt to perform push ups off arm rests as HEP while up in chair   Shoulder Instructions       General Comments      Pertinent Vitals/ Pain       Pain Assessment: Faces Faces Pain Scale: Hurts little more Pain Location: back and wound site Pain Descriptors / Indicators: Grimacing;Guarding;Moaning Pain Intervention(s): Limited activity within patient's tolerance;Monitored during session;Repositioned;Premedicated before session  Home Living                                          Prior Functioning/Environment              Frequency  Min 3X/week        Progress Toward Goals  OT Goals(current goals can now be found in the care plan section)  Progress towards OT goals: Progressing toward goals  Acute Rehab OT Goals Patient Stated Goal: get better OT Goal Formulation: With patient Time For Goal Achievement: 09/05/21 Potential to Achieve Goals: Good ADL Goals Pt Will Perform Grooming: sitting;with supervision Pt Will Perform Upper Body Bathing: with supervision;sitting Pt Will Perform Lower Body Dressing: with min assist;bed level;sitting/lateral leans Pt Will Transfer to Toilet: with mod assist;bedside commode;anterior/posterior transfer;with transfer board Pt/caregiver will Perform Home Exercise Program: Increased ROM;Left upper extremity;With written HEP provided Additional ADL Goal #1: Pt will complete bed mobility with mod assist as precursor to ADLs.  Plan Discharge plan remains appropriate    Co-evaluation    PT/OT/SLP Co-Evaluation/Treatment: Yes Reason for Co-Treatment: Complexity of  the patient's impairments (multi-system involvement);For patient/therapist safety;To address functional/ADL transfers PT goals addressed during session: Mobility/safety with mobility;Balance;Strengthening/ROM OT goals addressed during session: Strengthening/ROM      AM-PAC OT "6 Clicks" Daily Activity     Outcome Measure   Help from another person eating meals?: A Little Help from another person taking care of personal grooming?: A Little Help from another person toileting, which includes using toliet, bedpan, or urinal?: A Lot Help from another person bathing (including washing, rinsing, drying)?: A Lot Help from another person to put on and taking off regular upper body clothing?: A Little Help from another person to put on and taking off regular lower body clothing?: A Lot 6 Click Score: 15    End of Session    OT Visit Diagnosis: Pain;Muscle weakness (generalized) (M62.81)   Activity Tolerance Patient tolerated treatment well   Patient Left in chair;with call bell/phone within reach   Nurse Communication Mobility status        Time: 1410-1433 OT Time Calculation (min): 23 min  Charges: OT General Charges $OT Visit: 1 Visit  OT Treatments $Therapeutic Activity: 8-22 mins  [name], OTA Acute Rehabilitation Services  Pager [phone] Office [place] 08/29/2021, 2:48 PM

## 2021-08-29 NOTE — Progress Notes (Signed)
Occupational Therapy Treatment Patient Details Name: [name] MRN: [id number] DOB: [birth year] Today's Date: 08/29/2021   History of present illness Pt is a 55 y.o. 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 S/p TEE, DCCV on 11/22. Chest CT 12/4 with pleural effusion; thoracentesis attempted with insufficient fluid volume. PMH includes R BKA, aflutter, DM, HTN, depression, anxiety.   OT comments  PT/OT returned to patient's room following 30 minutes of sitting up in recliner. Patient stated sitting became increasing more intense following 10 minutes of sitting up and asked to return to bed. Patient was mod/max assist +2 for lateral scoot transfer due to increased back pain and at wound side. Patient required assistance to return to supine and was able to assist with positioning once in bed. Acute OT to continue to follow.    Recommendations for follow up therapy are one component of a multi-disciplinary discharge planning process, led by the attending physician.  Recommendations may be updated based on patient status, additional functional criteria and insurance authorization.    Follow Up Recommendations  Skilled nursing-short term rehab (<3 hours/day)    Assistance Recommended at Discharge Frequent or constant Supervision/Assistance  Equipment Recommendations  None recommended by OT    Recommendations for Other Services      Precautions / Restrictions Precautions Precautions: Fall;Back Precaution Booklet Issued: No Precaution Comments: Back precautions for comfort Required Braces or Orthoses: Other Brace Other Brace: LLE limb guard; has RLE prosthetic in room (not fitting currently per pt?) Restrictions Weight Bearing Restrictions: Yes LLE Weight Bearing: Non  weight bearing Other Position/Activity Restrictions: RLE prosthetic is not fitting currently per pt       Mobility Bed Mobility Overal bed mobility: Needs Assistance Bed Mobility: Sit to Supine     Supine to sit: Min guard;+2 for safety/equipment;HOB elevated Sit to supine: Mod assist;+2 for physical assistance   General bed mobility comments: patient transferred with HOB down and was able to assist with positoning with rails and trapeze    Transfers Overall transfer level: Needs assistance Equipment used: None Transfers: Bed to chair/wheelchair/BSC            Lateral/Scoot Transfers: Max assist;Mod assist;+2 physical assistance;+2 safety/equipment General transfer comment: patient required increased assistance to return to bed from recliner due to pain and back cramps     Balance Overall balance assessment: Needs assistance Sitting-balance support: Single extremity supported;Bilateral upper extremity supported Sitting balance-Leahy Scale: Poor Sitting balance - Comments: reliant on assistance for balance when returning to bed due to pain       Standing balance comment: Unable                           ADL either performed or assessed with clinical judgement   ADL                                              Extremity/Trunk Assessment              Vision       Perception     Praxis      Cognition Arousal/Alertness: Awake/alert Behavior During Therapy: [facility] for tasks assessed/performed Overall Cognitive  Status: No family/caregiver present to determine baseline cognitive functioning Area of Impairment: Attention;Safety/judgement;Awareness;Problem solving                   Current Attention Level: Selective     Safety/Judgement: Decreased awareness of deficits Awareness: Emergent Problem Solving: Requires verbal cues;Difficulty sequencing General Comments: Patient adamently wanting to go back to bed due to pain  at wound site          Exercises Exercises: Other exercises Other Exercises Other Exercises: Modified sit-ups from reclined reclinder, modAx2 to clear scapulas, 10x Other Exercises: educated pt to perform push ups off arm rests as HEP while up in chair   Shoulder Instructions       General Comments      Pertinent Vitals/ Pain       Pain Assessment: Faces Faces Pain Scale: Hurts even more Pain Location: back and wound site Pain Descriptors / Indicators: Grimacing;Guarding;Moaning Pain Intervention(s): Repositioned;Limited activity within patient's tolerance  Home Living                                          Prior Functioning/Environment              Frequency  Min 3X/week        Progress Toward Goals  OT Goals(current goals can now be found in the care plan section)  Progress towards OT goals: Progressing toward goals  Acute Rehab OT Goals Patient Stated Goal: tolerate sitting longer OT Goal Formulation: With patient Time For Goal Achievement: 09/05/21 Potential to Achieve Goals: Good ADL Goals Pt Will Perform Grooming: sitting;with supervision Pt Will Perform Upper Body Bathing: with supervision;sitting Pt Will Perform Lower Body Dressing: with min assist;bed level;sitting/lateral leans Pt Will Transfer to Toilet: with mod assist;bedside commode;anterior/posterior transfer;with transfer board Pt/caregiver will Perform Home Exercise Program: Increased ROM;Left upper extremity;With written HEP provided Additional ADL Goal #1: Pt will complete bed mobility with mod assist as precursor to ADLs.  Plan Discharge plan remains appropriate    Co-evaluation    PT/OT/SLP Co-Evaluation/Treatment: Yes Reason for Co-Treatment: Complexity of the patient's impairments (multi-system involvement);For patient/therapist safety;To address functional/ADL transfers PT goals addressed during session: Mobility/safety with  mobility;Balance;Strengthening/ROM OT goals addressed during session: Strengthening/ROM      AM-PAC OT "6 Clicks" Daily Activity     Outcome Measure   Help from another person eating meals?: A Little Help from another person taking care of personal grooming?: A Little Help from another person toileting, which includes using toliet, bedpan, or urinal?: A Lot Help from another person bathing (including washing, rinsing, drying)?: A Lot Help from another person to put on and taking off regular upper body clothing?: A Little Help from another person to put on and taking off regular lower body clothing?: A Lot 6 Click Score: 15    End of Session    OT Visit Diagnosis: Pain;Muscle weakness (generalized) (M62.81)   Activity Tolerance Patient limited by pain   Patient Left in bed;with call bell/phone within reach   Nurse Communication Mobility status        Time: 4268-3419 OT Time Calculation (min): 14 min  Charges: OT General Charges $OT Visit: 1 Visit OT Treatments $Therapeutic Activity: 8-22 mins  [name], OTA Acute Rehabilitation Services  Pager [phone] Office [place] 08/29/2021, 3:20 PM

## 2021-08-29 NOTE — Progress Notes (Signed)
Physical Therapy Treatment Patient Details Name: [name] MRN: [id number] DOB: 1965-11-10 Today's Date: 08/29/2021   History of Present Illness Pt is a 55 y.o. 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 S/p TEE, DCCV on 11/22. Chest CT 12/4 with pleural effusion; thoracentesis attempted with insufficient fluid volume. PMH includes R BKA, aflutter, DM, HTN, depression, anxiety.    PT Comments    Pt making great progress with mobility this date, demonstrating improved initiation and tolerance with all mobility. Pt was able to transition supine > sit R EOB with min guard assist, extra time, and a lot of assistance from elevating the HOB. Pt was also able to perform a lateral scoot transfer to his R with minAx2 today. Pt appears to be tolerating the Roho cushion well while sitting in the chair upon end of session. Educated pt to perform modified sit-ups and push ups while seated in recliner to improve core and UE strength for improved stability and buttocks clearance with transfers. Will continue to follow acutely. Current recommendations remain appropriate.    Recommendations for follow up therapy are one component of a multi-disciplinary discharge planning process, led by the attending physician.  Recommendations may be updated based on patient status, additional functional criteria and insurance authorization.  Follow Up Recommendations  Acute inpatient rehab (3hours/day) ([facility] declined, thus if not inpt rehab then recommend SNF if pt agreeable to it)     Assistance Recommended at Discharge Frequent or constant Supervision/Assistance  Equipment Recommendations  Wheelchair (measurements PT);Wheelchair cushion (measurements PT);Hospital bed;Other (comment);BSC/3in1  (hoyer lift; all equipment with drop arms and for bil amputee)    Recommendations for Other Services       Precautions / Restrictions Precautions Precautions: Fall;Back Precaution Booklet Issued: No Precaution Comments: Back precautions for comfort Required Braces or Orthoses: Other Brace Other Brace: LLE limb guard; has RLE prosthetic in room (not fitting currently per pt?) Restrictions Weight Bearing Restrictions: Yes LLE Weight Bearing: Non weight bearing Other Position/Activity Restrictions: RLE prosthetic is not fitting currently per pt     Mobility  Bed Mobility Overal bed mobility: Needs Assistance Bed Mobility: Supine to Sit     Supine to sit: Min guard;+2 for safety/equipment;HOB elevated     General bed mobility comments: HOB elevated significantly with pt propped on R elbow to ascend trunk to sit R EOB, min guard +2 for safety.    Transfers Overall transfer level: Needs assistance Equipment used: None Transfers: Bed to chair/wheelchair/BSC            Lateral/Scoot Transfers: Min assist;+2 physical assistance;+2 safety/equipment General transfer comment: Pt needing cues to pivot buttocks to align self with chair for lateral scoot to R bed > recliner with L arm rest dropped on recliner. MinAx2 with use of bed pad to assist in elevating buttocks for final transfer between surfaces.    Ambulation/Gait               General Gait Details: Unable   Stairs             Wheelchair Mobility    Modified Rankin (Stroke Patients Only)       Balance Overall balance assessment: Needs assistance Sitting-balance support: Single extremity supported;Bilateral upper extremity supported Sitting balance-Leahy Scale: Poor Sitting balance - Comments: Pt reliant on 1-2 UE support to sit  statically EOB with min guard assist.       Standing balance comment: Unable                            Cognition Arousal/Alertness: Awake/alert Behavior  During Therapy: WFL for tasks assessed/performed Overall Cognitive Status: No family/caregiver present to determine baseline cognitive functioning Area of Impairment: Attention;Safety/judgement;Awareness;Problem solving                   Current Attention Level: Selective     Safety/Judgement: Decreased awareness of deficits Awareness: Emergent Problem Solving: Requires verbal cues;Difficulty sequencing General Comments: Pt easily distracted and changes subject often, needs redirecting at times. Pt with some poor insight into exercises needed or techniques to perform to progress. Pt needing step by step directing for sequencing transfer OOB.        Exercises Other Exercises Other Exercises: Modified sit-ups from reclined reclinder, modAx2 to clear scapulas, 10x Other Exercises: educated pt to perform push ups off arm rests as HEP while up in chair    General Comments        Pertinent Vitals/Pain Pain Assessment: Faces Faces Pain Scale: Hurts little more Pain Location: back and wound site Pain Descriptors / Indicators: Grimacing;Guarding;Moaning Pain Intervention(s): Limited activity within patient's tolerance;Monitored during session;Repositioned;Premedicated before session    Home Living                          Prior Function            PT Goals (current goals can now be found in the care plan section) Acute Rehab PT Goals Patient Stated Goal: to get to chair PT Goal Formulation: With patient Time For Goal Achievement: 09/01/21 Potential to Achieve Goals: Fair Progress towards PT goals: Progressing toward goals    Frequency    Min 3X/week      PT Plan Current plan remains appropriate    Co-evaluation PT/OT/SLP Co-Evaluation/Treatment: Yes Reason for Co-Treatment: Complexity of the patient's impairments (multi-system involvement);For patient/therapist safety;To address functional/ADL transfers PT goals addressed during session:  Mobility/safety with mobility;Balance;Strengthening/ROM        AM-PAC PT "6 Clicks" Mobility   Outcome Measure  Help needed turning from your back to your side while in a flat bed without using bedrails?: A Little Help needed moving from lying on your back to sitting on the side of a flat bed without using bedrails?: A Lot Help needed moving to and from a bed to a chair (including a wheelchair)?: A Lot Help needed standing up from a chair using your arms (e.g., wheelchair or bedside chair)?: Total Help needed to walk in hospital room?: Total Help needed climbing 3-5 steps with a railing? : Total 6 Click Score: 10    End of Session   Activity Tolerance: Patient tolerated treatment well Patient left: in chair;with call bell/phone within reach Nurse Communication: Mobility status;Need for lift equipment PT Visit Diagnosis: Other abnormalities of gait and mobility (R26.89);Muscle weakness (generalized) (M62.81);Pain Pain - part of body:  (back and buttocks)     Time: 1410-1433 PT Time Calculation (min) (ACUTE ONLY): 23 min  Charges:  $Therapeutic Activity: 8-22 mins                     Moishe Spice, PT, DPT Acute Rehabilitation Services  Pager: [phone] Office: [phone]    Orvan Falconer 08/29/2021, 2:43 PM

## 2021-08-29 NOTE — Progress Notes (Addendum)
PROGRESS NOTE  [name]  ZES:[id number] DOB: 01-02-1966 DOA: 07/21/2021 PCP: Patient, No Pcp Per (Inactive)   Brief Narrative: 55 year old male with history of DM-2, prior right BKA, HFpEF, hypertension, dyslipidemia presented with left diabetic foot-resulting in septic shock with group B streptococcal bacteremia.  He was initially managed in the ICU-orthopedics performed left transtibial amputation on 11/6.  Hospital course was prolonged and complicated by thoracic discitis/osteomyelitis/epidural abscess, new diagnosis of HFrEF, atrial flutter, sacral decubitus ulcer.  He was evaluated by cardiology, neurosurgery, and infectious disease.  See below for further details.(note-MD assumed care from 12/21)  Subjective: No major issues-pain well controlled with current dosing of narcotics.  Objective: Vitals: Blood pressure 122/68, pulse 66, temperature 98.1 F (36.7 C), temperature source Oral, resp. rate 16, height 6\' 3"  (1.905 m), weight 107 kg, SpO2 98 %.   Physical exam: Gen Exam:Alert awake-not in any distress HEENT:atraumatic, normocephalic Chest: B/L clear to auscultation anteriorly CVS:S1S2 regular Abdomen:soft non tender, non distended Extremities: Bilateral BKA. Neurology: Non focal Skin: no rash    Assessment & Plan: Septic shock due to left diabetic foot infection, Streptococcus agalactiae bacteremia and thoracic discitis: Sepsis physiology has resolved-did require ICU stay during the early part of this hospitalization for pressors.  Remains on IV vancomycin-patient is now s/p left BKA.    Left diabetic foot infection: S/p left BKA on 11/16-wound VAC has been removed-Staples removed on 12/16.  Recommendations from orthopedics for dry dressing.  Group B streptococcus bacteremia: No vegetation seen on TEE-repeat blood cultures on 11/17 negative so far.  ID following-recommendations are to continue IV vancomycin until 12/26 with plans to transition to cefadroxil 1 g twice  daily from 12/27 for 4 weeks.   Acute thoracic discitis/osteomyelitis (T6-T7 )with epidural abscess Neurosurgery following-recommendations are to continue with IV antibiotics-Per neurosurgery-no need for surgery given repeat MRI on 12/16 showed improvement.  Will need outpatient follow-up with Dr. Ronnald Ramp in 3 weeks.  Full-thickness sacral decubitus ulcer: Wound care team following-currently getting hydrotherapy.  New onset systolic heart failure: Volume status stable-continue torsemide, metoprolol and BiDil.  Per cardiology note-not a candidate for ACE/ARB/ARNI due to renal dysfunction.  Plans are to follow with cardiology in the outpatient setting.  New onset atrial flutter-s/p TEE/DCCV on 11/12-maintaining sinus rhythm-on amiodarone/Eliquis.  Cardiology no longer following.  Will need to follow-up with cardiology in the outpatient setting.  AKI on CKD stage IIIa: AKI hemodynamically mediated-creatinine has improved and close to baseline.  Acute urinary retention: Foley catheter removed on 12/19-voiding spontaneously.  Continue Flomax.  Acute on chronic pain syndrome: Back pain stable today-continue Neurontin, OxyContin-and short acting oxycodone/Dilaudid for breakthrough pain.    Follows with pain management-Dr. Nelva Bush in the outpatient setting.  On bowel regimen with MiraLAX and other as needed laxatives.  Insomnia: Continue trazodone  DM-2 (A1c 13.8 on 11/15) with uncontrolled hyperglycemia: CBGs stable-continue Levemir 36 units twice daily, 6 units of NovoLog with meals and SSI.  Recent Labs    08/28/21 1600 08/28/21 2137 08/29/21 0607  GLUCAP 116* 239* 107*       Liver nodularity: Noted incidentally on CT chest 04/09/2021-stable for outpatient follow-up with PCP.   Multiple small bilateral pulmonary nodules:  Unchanged on last CT chest 35mm and smalled definitively benign, no further evaluation recommended.   Reported renal lesion: Per pt. Follows with Dr. Louis Meckel.    Pleural  effusion: Chronic, multifactorial partially related to CHF and to hypoalbuminemia. Not sufficient for thoracentesis.   Chronic testosterone use: Continue AndroGel.  Physical deconditioning due  to acute illness-probably will require SNF on discharge.  Nutrition Status: Nutrition Problem: Increased nutrient needs Etiology: wound healing Signs/Symptoms: estimated needs Interventions: MVI, Premier Protein     RN Pressure Injury Documentation: Pressure Injury 07/27/21 Sacrum Mid Unstageable - Full thickness tissue loss in which the base of the injury is covered by slough (yellow, tan, gray, green or brown) and/or eschar (tan, brown or black) in the wound bed. small pink pressure ulcer with bre (Active)  07/27/21 0255  Location: Sacrum  Location Orientation: Mid  Staging: Unstageable - Full thickness tissue loss in which the base of the injury is covered by slough (yellow, tan, gray, green or brown) and/or eschar (tan, brown or black) in the wound bed.  Wound Description (Comments): small pink pressure ulcer with break in skin 08/12/21; updated to unstageable pressure injury  Present on Admission: Yes    DVT prophylaxis: Eliquis Code Status: Full Family Communication: None at bedside. Disposition Plan:  Status is: Inpatient  Remains inpatient appropriate because: Needs ongoing wound care with hydrotherapy-denied by LTAC-not a candidate for inpatient rehab-we will eventually need SNF.  Discussed with patient on 12/22-he is reluctant but does understand.  Consultants:  ID Neurosurgery PCCM Cardiology  Procedures:  Left TTA 07/23/2021 TEE/DCCV 07/29/2021 PICC RUE 08/13/2021  Antimicrobials: Cefazolin 11/16 Ceftriaxone 11/14, 11/16-17 Clindamycin 11/15-16 Linezolid 11/15 Pen G 11/18 Pip-tazo 11/15-11/16 Vancomycin 11/14 Cefepime 12/8 - 12/12  Data Reviewed: I have personally reviewed following labs and imaging studies  CBC: Recent Labs  Lab 08/25/21 0830 08/28/21 0517   WBC 6.6 7.6  HGB 14.2 11.9*  HCT 44.3 36.9*  MCV 93.7 90.0  PLT 151 157    Basic Metabolic Panel: Recent Labs  Lab 08/25/21 0830 08/28/21 0517  NA 135 131*  K 4.0 3.9  CL 102 97*  CO2 25 28  GLUCOSE 159* 187*  BUN 18 29*  CREATININE 1.03 1.27*  CALCIUM 8.7* 8.3*    GFR: Estimated Creatinine Clearance: 86.9 mL/min (A) (by C-G formula based on SCr of 1.27 mg/dL (H)). Liver Function Tests: No results for input(s): AST, ALT, ALKPHOS, BILITOT, PROT, ALBUMIN in the last 168 hours.  No results for input(s): LIPASE, AMYLASE in the last 168 hours. No results for input(s): AMMONIA in the last 168 hours. Coagulation Profile: No results for input(s): INR, PROTIME in the last 168 hours. Cardiac Enzymes: No results for input(s): CKTOTAL, CKMB, CKMBINDEX, TROPONINI in the last 168 hours. BNP (last 3 results) No results for input(s): PROBNP in the last 8760 hours. HbA1C: No results for input(s): HGBA1C in the last 72 hours. CBG: Recent Labs  Lab 08/28/21 0631 08/28/21 1131 08/28/21 1600 08/28/21 2137 08/29/21 0607  GLUCAP 165* 111* 116* 239* 107*    Lipid Profile: No results for input(s): CHOL, HDL, LDLCALC, TRIG, CHOLHDL, LDLDIRECT in the last 72 hours. Thyroid Function Tests: No results for input(s): TSH, T4TOTAL, FREET4, T3FREE, THYROIDAB in the last 72 hours. Anemia Panel: No results for input(s): VITAMINB12, FOLATE, FERRITIN, TIBC, IRON, RETICCTPCT in the last 72 hours.  Urine analysis:    Component Value Date/Time   COLORURINE YELLOW 07/22/2021 0250   APPEARANCEUR CLOUDY (A) 07/22/2021 0250   LABSPEC 1.018 07/22/2021 0250   PHURINE 5.0 07/22/2021 0250   GLUCOSEU >=500 (A) 07/22/2021 0250   HGBUR SMALL (A) 07/22/2021 0250   BILIRUBINUR NEGATIVE 07/22/2021 0250   KETONESUR 5 (A) 07/22/2021 0250   PROTEINUR 30 (A) 07/22/2021 0250   UROBILINOGEN 0.2 09/13/2019 0906   NITRITE NEGATIVE 07/22/2021 0250  LEUKOCYTESUR TRACE (A) 07/22/2021 0250   No results found  for this or any previous visit (from the past 240 hour(s)).    Radiology Studies: No results found.  Scheduled Meds:  acetaminophen  1,000 mg Oral TID   amiodarone  200 mg Oral Daily   apixaban  5 mg Oral BID   vitamin C  1,000 mg Oral Daily   [START ON 09/02/2021] cefadroxil  1,000 mg Oral BID   Chlorhexidine Gluconate Cloth  6 each Topical Daily   [START ON 09/03/2021] collagenase   Topical Daily   finasteride  5 mg Oral Daily   gabapentin  400 mg Oral TID   insulin aspart  0-15 Units Subcutaneous TID WC   insulin aspart  0-5 Units Subcutaneous QHS   insulin aspart  6 Units Subcutaneous TID WC   insulin detemir  36 Units Subcutaneous BID   isosorbide mononitrate  30 mg Oral Daily   methocarbamol  1,000 mg Oral Q8H   metoprolol succinate  50 mg Oral Daily   multivitamin with minerals  1 tablet Oral Daily   nystatin cream   Topical BID   oxyCODONE  20 mg Oral Q12H   polyethylene glycol  17 g Oral Daily   Ensure Max Protein  11 oz Oral BID BM   silver nitrate applicators  1 application Topical Once   sodium chloride flush  10-40 mL Intracatheter Q12H   sodium hypochlorite   Irrigation Daily   tamsulosin  0.4 mg Oral Daily   testosterone  5 g Transdermal Daily   torsemide  20 mg Oral QODAY   Continuous Infusions:  sodium chloride Stopped (08/28/21 1720)   vancomycin 1,000 mg (08/29/21 0653)     LOS: 38 days    Oren Binet, MD [facility] [email or web address] 08/29/2021, 10:55 AM

## 2021-08-29 NOTE — Plan of Care (Signed)
°  Problem: Education: Goal: Knowledge of disease or condition will improve Outcome: Progressing Goal: Understanding of medication regimen will improve Outcome: Progressing Goal: Individualized Educational Video(s) Outcome: Progressing   Problem: Activity: Goal: Ability to tolerate increased activity will improve Outcome: Progressing   Problem: Cardiac: Goal: Ability to achieve and maintain adequate cardiopulmonary perfusion will improve Outcome: Progressing   Problem: Pain Managment: Goal: General experience of comfort will improve Outcome: Progressing

## 2021-08-30 DIAGNOSIS — M4644 Discitis, unspecified, thoracic region: Secondary | ICD-10-CM | POA: Diagnosis not present

## 2021-08-30 DIAGNOSIS — I4892 Unspecified atrial flutter: Secondary | ICD-10-CM | POA: Diagnosis not present

## 2021-08-30 DIAGNOSIS — N179 Acute kidney failure, unspecified: Secondary | ICD-10-CM | POA: Diagnosis not present

## 2021-08-30 DIAGNOSIS — A419 Sepsis, unspecified organism: Secondary | ICD-10-CM | POA: Diagnosis not present

## 2021-08-30 LAB — CBC
HCT: 37.7 % — ABNORMAL LOW (ref 39.0–52.0)
Hemoglobin: 12.2 g/dL — ABNORMAL LOW (ref 13.0–17.0)
MCH: 29.4 pg (ref 26.0–34.0)
MCHC: 32.4 g/dL (ref 30.0–36.0)
MCV: 90.8 fL (ref 80.0–100.0)
Platelets: 212 10*3/uL (ref 150–400)
RBC: 4.15 MIL/uL — ABNORMAL LOW (ref 4.22–5.81)
RDW: 14.6 % (ref 11.5–15.5)
WBC: 7.8 10*3/uL (ref 4.0–10.5)
nRBC: 0 % (ref 0.0–0.2)

## 2021-08-30 LAB — BASIC METABOLIC PANEL
Anion gap: 7 (ref 5–15)
BUN: 22 mg/dL — ABNORMAL HIGH (ref 6–20)
CO2: 30 mmol/L (ref 22–32)
Calcium: 8.8 mg/dL — ABNORMAL LOW (ref 8.9–10.3)
Chloride: 99 mmol/L (ref 98–111)
Creatinine, Ser: 1.12 mg/dL (ref 0.61–1.24)
GFR, Estimated: >60 mL/min (ref >60)
Glucose, Bld: 166 mg/dL — ABNORMAL HIGH (ref 70–99)
Potassium: 3.9 mmol/L (ref 3.5–5.1)
Sodium: 136 mmol/L (ref 135–145)

## 2021-08-30 LAB — GLUCOSE, CAPILLARY
Glucose-Capillary: 127 mg/dL — ABNORMAL HIGH (ref 70–99)
Glucose-Capillary: 206 mg/dL — ABNORMAL HIGH (ref 70–99)
Glucose-Capillary: 143 mg/dL — ABNORMAL HIGH (ref 70–99)

## 2021-08-30 MED ORDER — DAKINS (1/4 STRENGTH) 0.125 % EX SOLN
Freq: Every day | CUTANEOUS | Status: DC
  Filled 2021-08-30: qty 473

## 2021-08-30 NOTE — Progress Notes (Addendum)
PROGRESS NOTE  [name]  WUX:[id number] DOB: 1965/11/10 DOA: 07/21/2021 PCP: Patient, No Pcp Per (Inactive)   Brief Narrative: 55 year old male with history of DM-2, prior right BKA, HFpEF, hypertension, dyslipidemia presented with left diabetic foot-resulting in septic shock with group B streptococcal bacteremia.  He was initially managed in the ICU-orthopedics performed left transtibial amputation on 11/6.  Hospital course was prolonged and complicated by thoracic discitis/osteomyelitis/epidural abscess, new diagnosis of HFrEF, atrial flutter, sacral decubitus ulcer.  He was evaluated by cardiology, neurosurgery, and infectious disease.  See below for further details. (note-MD assumed care from 12/21)  Subjective: No major issues overnight-lying comfortably in bed-pain adequately controlled.  Inquiring why he cannot go to CIR.  Objective: Vitals: Blood pressure (!) 159/86, pulse (!) 54, temperature 97.6 F (36.4 C), temperature source Oral, resp. rate 20, height 6\' 3"  (1.905 m), weight 107 kg, SpO2 98 %.   Physical exam: Gen Exam:Alert awake-not in any distress HEENT:atraumatic, normocephalic Chest: B/L clear to auscultation anteriorly CVS:S1S2 regular Abdomen:soft non tender, non distended Extremities: Bilateral BKA. Neurology: Non focal Skin: no rash    Assessment & Plan: Septic shock due to left diabetic foot infection, Streptococcus agalactiae bacteremia and thoracic discitis: Sepsis physiology has resolved-did require ICU stay during the early part of this hospitalization for pressors.  Remains on IV vancomycin-patient is now s/p left BKA.    Left diabetic foot infection: S/p left BKA on 11/16-wound VAC has been removed-Staples removed on 12/16.  Recommendations from orthopedics for dry dressing.  Group B streptococcus bacteremia: No vegetation seen on TEE-repeat blood cultures on 11/17 negative so far.  ID following-recommendations are to continue IV vancomycin until  12/26 with plans to transition to cefadroxil 1 g twice daily from 12/27 for 4 weeks.   Acute thoracic discitis/osteomyelitis (T6-T7 )with epidural abscess Neurosurgery following-recommendations are to continue with IV antibiotics-Per neurosurgery-no need for surgery given repeat MRI on 12/16 showed improvement.  Will need outpatient follow-up with Dr. Ronnald Ramp in 3 weeks.  Full-thickness sacral decubitus ulcer: Wound care team following-currently getting hydrotherapy.  New onset systolic heart failure: Volume status stable-continue torsemide, metoprolol and BiDil.  Per cardiology note-not a candidate for ACE/ARB/ARNI due to renal dysfunction.  Plans are to follow with cardiology in the outpatient setting.  New onset atrial flutter-s/p TEE/DCCV on 11/12-maintaining sinus rhythm-on amiodarone/Eliquis.  Cardiology no longer following.  Will need to follow-up with cardiology in the outpatient setting.  AKI on CKD stage IIIa: AKI hemodynamically mediated-creatinine has improved and close to baseline.  Acute urinary retention: Foley catheter removed on 12/19-voiding spontaneously.  Continue Flomax.  Acute on chronic pain syndrome: Back pain stable today-continue Neurontin, OxyContin-and short acting oxycodone/Dilaudid for breakthrough pain.    Follows with pain management-Dr. Nelva Bush in the outpatient setting.  On bowel regimen with MiraLAX and other as needed laxatives.  Insomnia: Continue trazodone  DM-2 (A1c 13.8 on 11/15) with uncontrolled hyperglycemia: CBGs stable-continue Levemir 36 units twice daily, 6 units of NovoLog with meals and SSI.  Recent Labs    08/29/21 1644 08/29/21 2140 08/30/21 0618  GLUCAP 253* 210* 127*       Liver nodularity: Noted incidentally on CT chest 04/09/2021-stable for outpatient follow-up with PCP.   Multiple small bilateral pulmonary nodules:  Unchanged on last CT chest 57mm and smalled definitively benign, no further evaluation recommended.   Reported renal  lesion: Per pt. Follows with Dr. Louis Meckel.    Pleural effusion: Chronic, multifactorial partially related to CHF and to hypoalbuminemia. Not sufficient for thoracentesis.  Chronic testosterone use: Continue AndroGel.  Physical deconditioning due to acute illness-plan is for SNF on discharge.  Patient wanting to know if he can go to CIR-we will touch base with CIR coordinator on Monday.  Nutrition Status: Nutrition Problem: Increased nutrient needs Etiology: wound healing Signs/Symptoms: estimated needs Interventions: MVI, Premier Protein     RN Pressure Injury Documentation: Pressure Injury 07/27/21 Sacrum Mid Unstageable - Full thickness tissue loss in which the base of the injury is covered by slough (yellow, tan, gray, green or brown) and/or eschar (tan, brown or black) in the wound bed. small pink pressure ulcer with bre (Active)  07/27/21 0255  Location: Sacrum  Location Orientation: Mid  Staging: Unstageable - Full thickness tissue loss in which the base of the injury is covered by slough (yellow, tan, gray, green or brown) and/or eschar (tan, brown or black) in the wound bed.  Wound Description (Comments): small pink pressure ulcer with break in skin 08/12/21; updated to unstageable pressure injury  Present on Admission: Yes    DVT prophylaxis: Eliquis Code Status: Full Family Communication: None at bedside. Disposition Plan:  Status is: Inpatient  Remains inpatient appropriate because: Needs ongoing wound care with hydrotherapy-denied by LTAC-not a candidate for inpatient rehab-we will eventually need SNF.  Discussed with patient on 12/22-he is reluctant but does understand.  Consultants:  ID Neurosurgery PCCM Cardiology  Procedures:  Left TTA 07/23/2021 TEE/DCCV 07/29/2021 PICC RUE 08/13/2021  Antimicrobials: Cefazolin 11/16 Ceftriaxone 11/14, 11/16-17 Clindamycin 11/15-16 Linezolid 11/15 Pen G 11/18 Pip-tazo 11/15-11/16 Vancomycin 11/14 Cefepime 12/8 -  12/12  Data Reviewed: I have personally reviewed following labs and imaging studies  CBC: Recent Labs  Lab 08/25/21 0830 08/28/21 0517 08/30/21 0624  WBC 6.6 7.6 7.8  HGB 14.2 11.9* 12.2*  HCT 44.3 36.9* 37.7*  MCV 93.7 90.0 90.8  PLT 151 212 154    Basic Metabolic Panel: Recent Labs  Lab 08/25/21 0830 08/28/21 0517 08/30/21 0624  NA 135 131* 136  K 4.0 3.9 3.9  CL 102 97* 99  CO2 25 28 30   GLUCOSE 159* 187* 166*  BUN 18 29* 22*  CREATININE 1.03 1.27* 1.12  CALCIUM 8.7* 8.3* 8.8*    GFR: Estimated Creatinine Clearance: 98.6 mL/min (by C-G formula based on SCr of 1.12 mg/dL). Liver Function Tests: No results for input(s): AST, ALT, ALKPHOS, BILITOT, PROT, ALBUMIN in the last 168 hours.  No results for input(s): LIPASE, AMYLASE in the last 168 hours. No results for input(s): AMMONIA in the last 168 hours. Coagulation Profile: No results for input(s): INR, PROTIME in the last 168 hours. Cardiac Enzymes: No results for input(s): CKTOTAL, CKMB, CKMBINDEX, TROPONINI in the last 168 hours. BNP (last 3 results) No results for input(s): PROBNP in the last 8760 hours. HbA1C: No results for input(s): HGBA1C in the last 72 hours. CBG: Recent Labs  Lab 08/29/21 0607 08/29/21 1121 08/29/21 1644 08/29/21 2140 08/30/21 0618  GLUCAP 107* 189* 253* 210* 127*    Lipid Profile: No results for input(s): CHOL, HDL, LDLCALC, TRIG, CHOLHDL, LDLDIRECT in the last 72 hours. Thyroid Function Tests: No results for input(s): TSH, T4TOTAL, FREET4, T3FREE, THYROIDAB in the last 72 hours. Anemia Panel: No results for input(s): VITAMINB12, FOLATE, FERRITIN, TIBC, IRON, RETICCTPCT in the last 72 hours.  Urine analysis:    Component Value Date/Time   COLORURINE YELLOW 07/22/2021 0250   APPEARANCEUR CLOUDY (A) 07/22/2021 0250   LABSPEC 1.018 07/22/2021 0250   PHURINE 5.0 07/22/2021 0250   GLUCOSEU >=500 (A)  07/22/2021 0250   HGBUR SMALL (A) 07/22/2021 0250   BILIRUBINUR  NEGATIVE 07/22/2021 0250   KETONESUR 5 (A) 07/22/2021 0250   PROTEINUR 30 (A) 07/22/2021 0250   UROBILINOGEN 0.2 09/13/2019 0906   NITRITE NEGATIVE 07/22/2021 0250   LEUKOCYTESUR TRACE (A) 07/22/2021 0250   No results found for this or any previous visit (from the past 240 hour(s)).    Radiology Studies: No results found.  Scheduled Meds:  acetaminophen  1,000 mg Oral TID   amiodarone  200 mg Oral Daily   apixaban  5 mg Oral BID   vitamin C  1,000 mg Oral Daily   [START ON 09/02/2021] cefadroxil  1,000 mg Oral BID   Chlorhexidine Gluconate Cloth  6 each Topical Daily   [START ON 09/03/2021] collagenase   Topical Daily   finasteride  5 mg Oral Daily   gabapentin  400 mg Oral TID   insulin aspart  0-15 Units Subcutaneous TID WC   insulin aspart  0-5 Units Subcutaneous QHS   insulin aspart  6 Units Subcutaneous TID WC   insulin detemir  36 Units Subcutaneous BID   isosorbide mononitrate  30 mg Oral Daily   methocarbamol  1,000 mg Oral Q8H   metoprolol succinate  50 mg Oral Daily   multivitamin with minerals  1 tablet Oral Daily   nystatin cream   Topical BID   oxyCODONE  20 mg Oral Q12H   polyethylene glycol  17 g Oral Daily   Ensure Max Protein  11 oz Oral BID BM   silver nitrate applicators  1 application Topical Once   sodium chloride flush  10-40 mL Intracatheter Q12H   tamsulosin  0.4 mg Oral Daily   testosterone  5 g Transdermal Daily   torsemide  20 mg Oral QODAY   Continuous Infusions:  sodium chloride Stopped (08/28/21 1720)   vancomycin 1,000 mg (08/30/21 3354)     LOS: 39 days    Oren Binet, MD [facility] [email or web address] 08/30/2021, 10:55 AM

## 2021-08-30 NOTE — Progress Notes (Signed)
Physical Therapy Wound Treatment Patient Details  Name: [name] MRN: [id number] Date of Birth: 1966-05-10  Today's Date: 08/30/2021 Time: 6384-6659 Time Calculation (min): 37 min  Subjective  Subjective Assessment Subjective: Pt repors lower back pain with rolling Patient and Family Stated Goals: Home to family Date of Onset:  (unknown)  Pain Score:   4-5/10 after pre-medication  Wound Assessment  Pressure Injury 07/27/21 Sacrum Mid Unstageable - Full thickness tissue loss in which the base of the injury is covered by slough (yellow, tan, gray, green or brown) and/or eschar (tan, brown or black) in the wound bed. small pink pressure ulcer with bre (Active)  Wound Image  08/28/21 1100  Dressing Type Barrier Film (skin prep);Gauze (Comment);Moist to moist;Santyl;Foam - Lift dressing to assess site every shift 08/30/21 1040  Dressing Changed 08/30/21 1040  Dressing Change Frequency Daily 08/30/21 1040  State of Healing Early/partial granulation 08/30/21 1040  Site / Wound Assessment Granulation tissue;Pale;Pink;Yellow 08/30/21 1040  % Wound base Red or Granulating 45% 08/30/21 1040  % Wound base Yellow/Fibrinous Exudate 55% 08/30/21 1040  % Wound base Black/Eschar 0% 08/29/21 1033  % Wound base Other/Granulation Tissue (Comment) 0% 08/28/21 1100  Peri-wound Assessment Erythema (blanchable) 08/30/21 1040  Wound Length (cm) 7 cm 08/28/21 1100  Wound Width (cm) 7 cm 08/28/21 1100  Wound Depth (cm) 2 cm 08/28/21 1100  Wound Surface Area (cm^2) 49 cm^2 08/28/21 1100  Wound Volume (cm^3) 98 cm^3 08/28/21 1100  Undermining (cm) 3 cm at 3 oclock, 1.5 cm at 12 oclock 08/28/21 1100  Margins Unattached edges (unapproximated) 08/30/21 1040  Drainage Amount Moderate 08/30/21 1040  Drainage Description Serous;No odor 08/30/21 1040  Treatment Debridement (Selective);Hydrotherapy (Pulse lavage);Packing (Saline gauze) 08/30/21 1040      Hydrotherapy Pulsed lavage therapy - wound location:  sacral Pulsed Lavage with Suction (psi): 8 psi (8-12) Pulsed Lavage with Suction - Normal Saline Used: 1000 mL Pulsed Lavage Tip: Tip with splash shield Selective Debridement Selective Debridement - Location: sacral Selective Debridement - Tools Used: Forceps, Scalpel Selective Debridement - Tissue Removed: yellow adherent necrotic tissue    Wound Assessment and Plan  Wound Therapy - Assess/Plan/Recommendations Wound Therapy - Clinical Statement: No blue/green color to drainage this date. Wound remains with dull pink areas and yellow adherent necrotic tissue. Resumed use of Santyl today which will help with removing necrotic tissue. Wound Therapy - Functional Problem List: Global weakness Factors Delaying/Impairing Wound Healing: Diabetes Mellitus, Incontinence, Infection - systemic/local, Immobility, Multiple medical problems Hydrotherapy Plan: Debridement, Dressing change, Patient/family education, Pulsatile lavage with suction Wound Therapy - Frequency: 6X / week Wound Therapy - Current Recommendations: PT Wound Therapy - Follow Up Recommendations: dressing changes by family/patient, dressing changes by RN  Wound Therapy Goals- Improve the function of patient's integumentary system by progressing the wound(s) through the phases of wound healing (inflammation - proliferation - remodeling) by: Wound Therapy Goals - Improve the function of patient's integumentary system by progressing the wound(s) through the phases of wound healing by: Decrease Necrotic Tissue to: 20% Decrease Necrotic Tissue - Progress: Progressing toward goal Increase Granulation Tissue to: 80% Increase Granulation Tissue - Progress: Progressing toward goal Goals/treatment plan/discharge plan were made with and agreed upon by patient/family: Yes Time For Goal Achievement: 7 days Wound Therapy - Potential for Goals: Good  Goals will be updated until maximal potential achieved or discharge criteria met.  Discharge  criteria: when goals achieved, discharge from hospital, MD decision/surgical intervention, no progress towards goals, refusal/missing three consecutive treatments without notification  or medical reason.  GP     Charges PT Wound Care Charges $Wound Debridement up to 20 cm: < or equal to 20 cm $ Wound Debridement each add'l 20 sqcm: 1 $PT PLS Gun and Tip: 1 Supply $PT Hydrotherapy Visit: 1 Visit      Arby Barrette, PT Acute Rehabilitation Services  Pager [phone] Office [phone]   Rexanne Mano 08/30/2021, 10:47 AM

## 2021-08-31 DIAGNOSIS — N179 Acute kidney failure, unspecified: Secondary | ICD-10-CM | POA: Diagnosis not present

## 2021-08-31 DIAGNOSIS — M4644 Discitis, unspecified, thoracic region: Secondary | ICD-10-CM | POA: Diagnosis not present

## 2021-08-31 DIAGNOSIS — I4892 Unspecified atrial flutter: Secondary | ICD-10-CM | POA: Diagnosis not present

## 2021-08-31 DIAGNOSIS — A419 Sepsis, unspecified organism: Secondary | ICD-10-CM | POA: Diagnosis not present

## 2021-08-31 LAB — GLUCOSE, CAPILLARY
Glucose-Capillary: 163 mg/dL — ABNORMAL HIGH (ref 70–99)
Glucose-Capillary: 189 mg/dL — ABNORMAL HIGH (ref 70–99)
Glucose-Capillary: 110 mg/dL — ABNORMAL HIGH (ref 70–99)
Glucose-Capillary: 137 mg/dL — ABNORMAL HIGH (ref 70–99)
Glucose-Capillary: 133 mg/dL — ABNORMAL HIGH (ref 70–99)

## 2021-08-31 MED ORDER — HYDRALAZINE HCL 25 MG PO TABS
25.0000 mg | ORAL_TABLET | Freq: Three times a day (TID) | ORAL | Status: DC
  Administered 2021-08-31 – 2021-09-01 (×3): 25 mg via ORAL
  Filled 2021-08-31 (×4): qty 1

## 2021-08-31 NOTE — Progress Notes (Addendum)
PROGRESS NOTE  [name]  JIR:[id number] DOB: 1965/12/04 DOA: 07/21/2021 PCP: Patient, No Pcp Per (Inactive)   Brief Narrative: 55 year old male with history of DM-2, prior right BKA, HFpEF, hypertension, dyslipidemia presented with left diabetic foot-resulting in septic shock with group B streptococcal bacteremia.  He was initially managed in the ICU-orthopedics performed left transtibial amputation on 11/6.  Hospital course was prolonged and complicated by thoracic discitis/osteomyelitis/epidural abscess, new diagnosis of HFrEF, atrial flutter, sacral decubitus ulcer.  He was evaluated by cardiology, neurosurgery, and infectious disease.  See below for further details. (note-MD assumed care from 12/21)  Subjective: Initially very cooperative-I explained that I will get in touch with acute rehab/CIR/nursing leadership on  Monday-and have him talk with them (we had the same conversation yesterday).  He feels that his sacral decubitus ulcer occurred in the hospital-and should be addressed at CIR if possible-and not make him go to SNF.  He then inquired about his MRSA nasal PCR being positive-I explained to him that he already received mupirocin etc.-and that it was a colonization and not real infection-he however proceeded to tell me that in [place] it was treated differently-and did not understand why we have not retested him.I tried to explain that this is a colonization and not a real infection (he has Streptococcus agalactiae in his blood cultures) -however he became very confrontational and turned the light off-and proceeded to tell me "that is it"-and stopped talking with me.  I subsequently touched base with the nursing staff-no major issues overnight, apparently has been asking for his short acting narcotic medications on the clock.   Objective: Vitals: Blood pressure (!) 157/85, pulse 98, temperature 98.4 F (36.9 C), temperature source Oral, resp. rate 16, height 6\' 3"  (1.905 m), weight 107  kg, SpO2 97 %.   Physical exam: Gen Exam:Alert awake-not in any distress HEENT:atraumatic, normocephalic Chest: B/L clear to auscultation anteriorly CVS:S1S2 regular Abdomen:soft non tender, non distended Extremities:no edema-b/l bka Neurology: Non focal Skin: no rash   Assessment & Plan: Septic shock due to left diabetic foot infection, Streptococcus agalactiae bacteremia and thoracic discitis: Sepsis physiology has resolved-did require ICU stay during the early part of this hospitalization for pressors.  Remains on IV vancomycin-patient is now s/p left BKA.    Left diabetic foot infection: S/p left BKA on 11/16-wound VAC has been removed-Staples removed on 12/16.  Recommendations from orthopedics for dry dressing.  Group B streptococcus bacteremia: No vegetation seen on TEE-repeat blood cultures on 11/17 negative so far.  ID following with recommendations to continue IV vancomycin until 12/26 with plans to transition to cefadroxil 1 g twice daily from 12/27 for 4 weeks.  Followed closely-  Acute thoracic discitis/osteomyelitis (T6-T7 )with epidural abscess Neurosurgery following-recommendations are to continue with IV antibiotics-Per neurosurgery-no need for surgery given repeat MRI on 12/16 showed improvement.  Will need outpatient follow-up with Dr. Ronnald Ramp in 3 weeks.  Full-thickness sacral decubitus ulcer: Wound care team following-continue hydrotherapy.  Being empirically covered with IV vancomycin-see above regarding IV antibiotics.  New onset systolic heart failure: Volume status stable-continue torsemide, metoprolol and BiDil.  Per cardiology note-not a candidate for ACE/ARB/ARNI due to renal dysfunction.  Plans are to follow with cardiology in the outpatient setting.  New onset atrial flutter-s/p TEE/DCCV on 11/12-maintaining sinus rhythm-on amiodarone/Eliquis.  Cardiology no longer following.  Will need to follow-up with cardiology in the outpatient setting.  AKI on CKD stage  IIIa: AKI hemodynamically mediated-creatinine has improved and close to baseline.  Acute urinary retention: Foley  catheter removed on 12/19-voiding spontaneously.  Continue Flomax.  Acute on chronic pain syndrome: Back pain stable today-continue Neurontin, OxyContin-and short acting oxycodone/Dilaudid for breakthrough pain.    Follows with pain management-Dr. Nelva Bush in the outpatient setting.  On bowel regimen with MiraLAX and other as needed laxatives.  Insomnia: Continue trazodone  DM-2 (A1c 13.8 on 11/15) with uncontrolled hyperglycemia: CBGs stable-continue Levemir 36 units twice daily, 6 units of NovoLog with meals and SSI.  Recent Labs    08/30/21 1142 08/30/21 1643 08/31/21 0612  GLUCAP 206* 143* 163*       Liver nodularity: Noted incidentally on CT chest 04/09/2021-stable for outpatient follow-up with PCP.   Multiple small bilateral pulmonary nodules:  Unchanged on last CT chest 7mm and smalled definitively benign, no further evaluation recommended.   Reported renal lesion: Per pt. Follows with Dr. Louis Meckel.    Pleural effusion: Chronic, multifactorial partially related to CHF and to hypoalbuminemia. Not sufficient for thoracentesis.   Chronic testosterone use: Continue AndroGel.  Physical deconditioning due to acute illness-plan is for SNF on discharge.  Patient wanting to know if he can go to CIR-we will touch base with CIR coordinator on Monday.  Nutrition Status: Nutrition Problem: Increased nutrient needs Etiology: wound healing Signs/Symptoms: estimated needs Interventions: MVI, Premier Protein     RN Pressure Injury Documentation: Pressure Injury 07/27/21 Sacrum Mid Unstageable - Full thickness tissue loss in which the base of the injury is covered by slough (yellow, tan, gray, green or brown) and/or eschar (tan, brown or black) in the wound bed. small pink pressure ulcer with bre (Active)  07/27/21 0255  Location: Sacrum  Location Orientation: Mid  Staging:  Unstageable - Full thickness tissue loss in which the base of the injury is covered by slough (yellow, tan, gray, green or brown) and/or eschar (tan, brown or black) in the wound bed.  Wound Description (Comments): small pink pressure ulcer with break in skin 08/12/21; updated to unstageable pressure injury  Present on Admission: Yes    DVT prophylaxis: Eliquis Code Status: Full Family Communication: None at bedside. Disposition Plan:  Status is: Inpatient  Remains inpatient appropriate because: Needs ongoing wound care with hydrotherapy-denied by LTAC-not a candidate for inpatient rehab-we will eventually need SNF.  Discussed with patient on 12/22-he is reluctant but does understand.  On 12/24-inquiring if he can be reassessed by CIR-see above.  We will touch base with case management/nursing leadership on 12/26.  Consultants:  ID Neurosurgery PCCM Cardiology  Procedures:  Left TTA 07/23/2021 TEE/DCCV 07/29/2021 PICC RUE 08/13/2021  Antimicrobials: Cefazolin 11/16 Ceftriaxone 11/14, 11/16-17 Clindamycin 11/15-16 Linezolid 11/15 Pen G 11/18 Pip-tazo 11/15-11/16 Vancomycin 11/14 Cefepime 12/8 - 12/12  Data Reviewed: I have personally reviewed following labs and imaging studies  CBC: Recent Labs  Lab 08/25/21 0830 08/28/21 0517 08/30/21 0624  WBC 6.6 7.6 7.8  HGB 14.2 11.9* 12.2*  HCT 44.3 36.9* 37.7*  MCV 93.7 90.0 90.8  PLT 151 212 063    Basic Metabolic Panel: Recent Labs  Lab 08/25/21 0830 08/28/21 0517 08/30/21 0624  NA 135 131* 136  K 4.0 3.9 3.9  CL 102 97* 99  CO2 25 28 30   GLUCOSE 159* 187* 166*  BUN 18 29* 22*  CREATININE 1.03 1.27* 1.12  CALCIUM 8.7* 8.3* 8.8*    GFR: Estimated Creatinine Clearance: 98.6 mL/min (by C-G formula based on SCr of 1.12 mg/dL). Liver Function Tests: No results for input(s): AST, ALT, ALKPHOS, BILITOT, PROT, ALBUMIN in the last 168 hours.  No  results for input(s): LIPASE, AMYLASE in the last 168 hours. No results  for input(s): AMMONIA in the last 168 hours. Coagulation Profile: No results for input(s): INR, PROTIME in the last 168 hours. Cardiac Enzymes: No results for input(s): CKTOTAL, CKMB, CKMBINDEX, TROPONINI in the last 168 hours. BNP (last 3 results) No results for input(s): PROBNP in the last 8760 hours. HbA1C: No results for input(s): HGBA1C in the last 72 hours. CBG: Recent Labs  Lab 08/29/21 2140 08/30/21 0618 08/30/21 1142 08/30/21 1643 08/31/21 0612  GLUCAP 210* 127* 206* 143* 163*    Lipid Profile: No results for input(s): CHOL, HDL, LDLCALC, TRIG, CHOLHDL, LDLDIRECT in the last 72 hours. Thyroid Function Tests: No results for input(s): TSH, T4TOTAL, FREET4, T3FREE, THYROIDAB in the last 72 hours. Anemia Panel: No results for input(s): VITAMINB12, FOLATE, FERRITIN, TIBC, IRON, RETICCTPCT in the last 72 hours.  Urine analysis:    Component Value Date/Time   COLORURINE YELLOW 07/22/2021 0250   APPEARANCEUR CLOUDY (A) 07/22/2021 0250   LABSPEC 1.018 07/22/2021 0250   PHURINE 5.0 07/22/2021 0250   GLUCOSEU >=500 (A) 07/22/2021 0250   HGBUR SMALL (A) 07/22/2021 0250   BILIRUBINUR NEGATIVE 07/22/2021 0250   KETONESUR 5 (A) 07/22/2021 0250   PROTEINUR 30 (A) 07/22/2021 0250   UROBILINOGEN 0.2 09/13/2019 0906   NITRITE NEGATIVE 07/22/2021 0250   LEUKOCYTESUR TRACE (A) 07/22/2021 0250   No results found for this or any previous visit (from the past 240 hour(s)).    Radiology Studies: No results found.  Scheduled Meds:  acetaminophen  1,000 mg Oral TID   amiodarone  200 mg Oral Daily   apixaban  5 mg Oral BID   vitamin C  1,000 mg Oral Daily   [START ON 09/02/2021] cefadroxil  1,000 mg Oral BID   Chlorhexidine Gluconate Cloth  6 each Topical Daily   [START ON 09/03/2021] collagenase   Topical Daily   finasteride  5 mg Oral Daily   gabapentin  400 mg Oral TID   insulin aspart  0-15 Units Subcutaneous TID WC   insulin aspart  0-5 Units Subcutaneous QHS   insulin  aspart  6 Units Subcutaneous TID WC   insulin detemir  36 Units Subcutaneous BID   isosorbide mononitrate  30 mg Oral Daily   methocarbamol  1,000 mg Oral Q8H   metoprolol succinate  50 mg Oral Daily   multivitamin with minerals  1 tablet Oral Daily   nystatin cream   Topical BID   oxyCODONE  20 mg Oral Q12H   polyethylene glycol  17 g Oral Daily   Ensure Max Protein  11 oz Oral BID BM   silver nitrate applicators  1 application Topical Once   sodium chloride flush  10-40 mL Intracatheter Q12H   sodium hypochlorite   Irrigation Daily   tamsulosin  0.4 mg Oral Daily   testosterone  5 g Transdermal Daily   torsemide  20 mg Oral QODAY   Continuous Infusions:  sodium chloride Stopped (08/28/21 1720)   vancomycin 1,000 mg (08/31/21 0553)     LOS: 40 days    Oren Binet, MD [facility] [email or web address] 08/31/2021, 10:53 AM

## 2021-08-31 NOTE — Plan of Care (Signed)
  Problem: Pain Managment: Goal: General experience of comfort will improve Outcome: Progressing   Problem: Safety: Goal: Ability to remain free from injury will improve Outcome: Progressing   Problem: Skin Integrity: Goal: Risk for impaired skin integrity will decrease Outcome: Progressing   

## 2021-09-01 DIAGNOSIS — A419 Sepsis, unspecified organism: Secondary | ICD-10-CM | POA: Diagnosis not present

## 2021-09-01 DIAGNOSIS — I4892 Unspecified atrial flutter: Secondary | ICD-10-CM | POA: Diagnosis not present

## 2021-09-01 DIAGNOSIS — M4644 Discitis, unspecified, thoracic region: Secondary | ICD-10-CM | POA: Diagnosis not present

## 2021-09-01 DIAGNOSIS — N179 Acute kidney failure, unspecified: Secondary | ICD-10-CM | POA: Diagnosis not present

## 2021-09-01 LAB — BASIC METABOLIC PANEL
Anion gap: 6 (ref 5–15)
BUN: 28 mg/dL — ABNORMAL HIGH (ref 6–20)
CO2: 27 mmol/L (ref 22–32)
Calcium: 7.8 mg/dL — ABNORMAL LOW (ref 8.9–10.3)
Chloride: 100 mmol/L (ref 98–111)
Creatinine, Ser: 1.43 mg/dL — ABNORMAL HIGH (ref 0.61–1.24)
GFR, Estimated: 58 mL/min — ABNORMAL LOW (ref >60)
Glucose, Bld: 127 mg/dL — ABNORMAL HIGH (ref 70–99)
Potassium: 4 mmol/L (ref 3.5–5.1)
Sodium: 133 mmol/L — ABNORMAL LOW (ref 135–145)

## 2021-09-01 LAB — GLUCOSE, CAPILLARY
Glucose-Capillary: 165 mg/dL — ABNORMAL HIGH (ref 70–99)
Glucose-Capillary: 83 mg/dL (ref 70–99)
Glucose-Capillary: 142 mg/dL — ABNORMAL HIGH (ref 70–99)

## 2021-09-01 LAB — CBC
HCT: 33.4 % — ABNORMAL LOW (ref 39.0–52.0)
Hemoglobin: 10.8 g/dL — ABNORMAL LOW (ref 13.0–17.0)
MCH: 29.6 pg (ref 26.0–34.0)
MCHC: 32.3 g/dL (ref 30.0–36.0)
MCV: 91.5 fL (ref 80.0–100.0)
Platelets: 212 10*3/uL (ref 150–400)
RBC: 3.65 MIL/uL — ABNORMAL LOW (ref 4.22–5.81)
RDW: 14.5 % (ref 11.5–15.5)
WBC: 10.7 10*3/uL — ABNORMAL HIGH (ref 4.0–10.5)
nRBC: 0 % (ref 0.0–0.2)

## 2021-09-01 MED ORDER — HYDRALAZINE HCL 25 MG PO TABS
25.0000 mg | ORAL_TABLET | Freq: Three times a day (TID) | ORAL | Status: DC
  Administered 2021-09-02 (×2): 25 mg via ORAL
  Filled 2021-09-01 (×2): qty 1

## 2021-09-01 MED ORDER — METOPROLOL SUCCINATE ER 25 MG PO TB24
25.0000 mg | ORAL_TABLET | Freq: Every day | ORAL | Status: DC
  Administered 2021-09-02 – 2021-09-03 (×2): 25 mg via ORAL
  Filled 2021-09-01 (×3): qty 1

## 2021-09-01 MED ORDER — ISOSORBIDE MONONITRATE ER 30 MG PO TB24
15.0000 mg | ORAL_TABLET | Freq: Every day | ORAL | Status: DC
  Administered 2021-09-02 – 2021-09-03 (×2): 15 mg via ORAL
  Filled 2021-09-01 (×3): qty 1

## 2021-09-01 MED ORDER — TORSEMIDE 20 MG PO TABS: 20.0000 mg | ORAL_TABLET | ORAL | Status: DC

## 2021-09-01 NOTE — Progress Notes (Signed)
Physical Therapy Wound Treatment Patient Details  Name: [name] MRN: [id number] Date of Birth: [birth year]  Today's Date: 09/01/2021 Time: 0388-8280 Time Calculation (min): 33 min  Subjective  Subjective Assessment Subjective: Pt reports shaking/chills last night Patient and Family Stated Goals: Home to family Date of Onset:  (unknown) Prior Treatments:  (unknown)  Pain Score:  2/10  Wound Assessment  Pressure Injury 07/27/21 Sacrum Mid Unstageable - Full thickness tissue loss in which the base of the injury is covered by slough (yellow, tan, gray, green or brown) and/or eschar (tan, brown or black) in the wound bed. small pink pressure ulcer with bre (Active)  Wound Image   09/01/21 1600  Dressing Type ABD;Barrier Film (skin prep);Gauze (Comment);Moist to dry;Santyl 09/01/21 1600  Dressing Changed;Clean;Dry;Intact 09/01/21 1600  Dressing Change Frequency Daily 09/01/21 1600  State of Healing Early/partial granulation 09/01/21 1600  Site / Wound Assessment Pink 09/01/21 1600  % Wound base Red or Granulating 45% 09/01/21 1600  % Wound base Yellow/Fibrinous Exudate 55% 09/01/21 1600  % Wound base Black/Eschar 0% 09/01/21 1600  % Wound base Other/Granulation Tissue (Comment) 0% 09/01/21 1600  Peri-wound Assessment Pink;Intact 09/01/21 1600  Wound Length (cm) 7 cm 08/28/21 1100  Wound Width (cm) 7 cm 08/28/21 1100  Wound Depth (cm) 2 cm 08/28/21 1100  Wound Surface Area (cm^2) 49 cm^2 08/28/21 1100  Wound Volume (cm^3) 98 cm^3 08/28/21 1100  Undermining (cm) 3 cm at 3 oclock, 1.5 cm at 12 oclock 08/28/21 1100  Margins Unattached edges (unapproximated) 08/31/21 2059  Drainage Amount Moderate 09/01/21 1600  Drainage Description Serosanguineous 09/01/21 1600  Treatment Debridement (Selective);Hydrotherapy (Pulse lavage);Packing (Saline gauze) 09/01/21 1600      Hydrotherapy Pulsed lavage therapy - wound location: sacral Pulsed Lavage with Suction (psi): 8 psi (8012) Pulsed  Lavage with Suction - Normal Saline Used: 1000 mL Pulsed Lavage Tip: Tip with splash shield Selective Debridement Selective Debridement - Location: sacral Selective Debridement - Tools Used: Forceps, Scalpel Selective Debridement - Tissue Removed: yellow adherent necrotic tissue    Wound Assessment and Plan  Wound Therapy - Assess/Plan/Recommendations Wound Therapy - Clinical Statement: Pt continues with no blue/green drainage and peri wound appears to be improved. Yellow slough remaining is very adherent and little to no debridement accomplished today. Discussed with Rolla RN and decreased frequency to 3x/wk. Wound Therapy - Functional Problem List: Global weakness Factors Delaying/Impairing Wound Healing: Diabetes Mellitus, Incontinence, Infection - systemic/local, Immobility, Multiple medical problems Hydrotherapy Plan: Debridement, Dressing change, Patient/family education, Pulsatile lavage with suction Wound Therapy - Frequency: 6X / week Wound Therapy - Current Recommendations: PT Wound Therapy - Follow Up Recommendations: dressing changes by family/patient, dressing changes by RN  Wound Therapy Goals- Improve the function of patient's integumentary system by progressing the wound(s) through the phases of wound healing (inflammation - proliferation - remodeling) by: Wound Therapy Goals - Improve the function of patient's integumentary system by progressing the wound(s) through the phases of wound healing by: Decrease Necrotic Tissue to: 20% Decrease Necrotic Tissue - Progress: Progressing toward goal Increase Granulation Tissue to: 80% Increase Granulation Tissue - Progress: Progressing toward goal Goals/treatment plan/discharge plan were made with and agreed upon by patient/family: Yes Time For Goal Achievement: 7 days Wound Therapy - Potential for Goals: Good  Goals will be updated until maximal potential achieved or discharge criteria met.  Discharge criteria: when goals achieved,  discharge from hospital, MD decision/surgical intervention, no progress towards goals, refusal/missing three consecutive treatments without notification or medical reason.  GP  Charges PT Wound Care Charges $Wound Debridement up to 20 cm: < or equal to 20 cm $ Wound Debridement each add'l 20 sqcm: 1 $PT PLS Gun and Tip: 1 Supply $PT Hydrotherapy Visit: 1 Visit  Wyona Almas, PT, DPT Acute Rehabilitation Services Pager [phone] Office [phone]       Deno Etienne 09/01/2021, 4:31 PM

## 2021-09-01 NOTE — Consult Note (Signed)
[place] Nurse wound follow up Patient receiving care in [facility] 2C02 Per [place] with PT Chrys Racer today, the wound continues with no blue/green drainage, peri wound looks much better, and yellow tissue that remains is adherent and she could not debride much of anything today. Hydrotherapy will be decreased to 3 x/week on M/W/F followed by Santyl over the wound, moistened saline gauze, ABD pad and Medipore tape or foam dressing to secure.  BEDSIDE NURSE to change dressing on all other days!!  Monitor the wound area(s) for worsening of condition such as: Signs/symptoms of infection, increase in size, development of or worsening of odor, development of pain, or increased pain at the affected locations.   Notify the medical team if any of these develop.  Please re-consult the [facility] team if needed.  Cathlean Marseilles Tamala Julian, MSN, RN, [place], Lysle Pearl, [facility] Wound Treatment Associate Pager [phone]

## 2021-09-01 NOTE — Progress Notes (Signed)
PROGRESS NOTE  [name]  QHU:[id number] DOB: 1966-02-28 DOA: 07/21/2021 PCP: Patient, No Pcp Per (Inactive)   Brief Narrative: 55 year old male with history of DM-2, prior right BKA, HFpEF, hypertension, dyslipidemia presented with left diabetic foot-resulting in septic shock with group B streptococcal bacteremia.  He was initially managed in the ICU-orthopedics performed left transtibial amputation on 11/6.  Hospital course was prolonged and complicated by thoracic discitis/osteomyelitis/epidural abscess, new diagnosis of HFrEF, atrial flutter, sacral decubitus ulcer.  He was evaluated by cardiology, neurosurgery, and infectious disease.  See below for further details. (note-MD assumed care from 12/21)  Subjective: Much more calm and cooperative today-rounded with nurse.  Said he was shivering this morning-but appeared fine when I saw him.  No fever.  Continues to have pain in his lower back area.   Objective: Vitals: Blood pressure (!) 160/81, pulse 88, temperature 99 F (37.2 C), temperature source Oral, resp. rate 19, height 6\' 3"  (1.905 m), weight 107 kg, SpO2 99 %.   Physical exam: Gen Exam:Alert awake-not in any distress HEENT:atraumatic, normocephalic Chest: B/L clear to auscultation anteriorly CVS:S1S2 regular Abdomen:soft non tender, non distended Extremities: Bilateral BKA-stump site looks benign. Neurology: Non focal Skin: no rash   Assessment & Plan: Septic shock due to left diabetic foot infection, Streptococcus agalactiae bacteremia and thoracic discitis: Sepsis physiology has resolved-did require ICU stay during the early part of this hospitalization for pressors.  Remains on IV vancomycin-patient is now s/p left BKA.    Left diabetic foot infection: S/p left BKA on 11/16-wound VAC has been removed-Staples removed on 12/16.  Recommendations from orthopedics for dry dressing.  Group B streptococcus bacteremia: No vegetation seen on TEE-repeat blood cultures on  11/17 negative so far.  ID following with recommendations to continue IV vancomycin until 12/26 with plans to transition to cefadroxil 1 g twice daily from 12/27 for 4 weeks.  Followed closely-  Acute thoracic discitis/osteomyelitis (T6-T7 )with epidural abscess Neurosurgery following-recommendations are to continue with IV antibiotics-Per neurosurgery-no need for surgery given repeat MRI on 12/16 showed improvement.  Will need outpatient follow-up with Dr. Ronnald Ramp in 3 weeks.  Full-thickness sacral decubitus ulcer: Wound care team following-continue hydrotherapy.  Being empirically covered with IV vancomycin-see above regarding IV antibiotics.  New onset systolic heart failure: Volume status stable-continue torsemide, metoprolol and BiDil.  Per cardiology note-not a candidate for ACE/ARB/ARNI due to renal dysfunction.  Plans are to follow with cardiology in the outpatient setting.  New onset atrial flutter-s/p TEE/DCCV on 11/12-maintaining sinus rhythm-on amiodarone/Eliquis.  Cardiology no longer following.  Will need to follow-up with cardiology in the outpatient setting.  AKI on CKD stage IIIa: AKI hemodynamically mediated-creatinine had improved.  However mild jump in creatinine today-hold Demadex for a few days-recheck electrolytes on 12/28.  Acute urinary retention: Foley catheter removed on 12/19-voiding spontaneously.  Continue Flomax.  Acute on chronic pain syndrome: Back pain stable today-continue Neurontin, OxyContin-and short acting oxycodone/Dilaudid for breakthrough pain.    Follows with pain management-Dr. Nelva Bush in the outpatient setting.  On bowel regimen with MiraLAX and other as needed laxatives.  Insomnia: Continue trazodone  DM-2 (A1c 13.8 on 11/15) with uncontrolled hyperglycemia: CBGs stable-continue Levemir 36 units twice daily, 6 units of NovoLog with meals and SSI.  Recent Labs    08/31/21 1613 08/31/21 2113 09/01/21 0653  GLUCAP 137* 133* 165*       Liver  nodularity: Noted incidentally on CT chest 04/09/2021-stable for outpatient follow-up with PCP.   Multiple small bilateral pulmonary nodules:  Unchanged  on last CT chest 80mm and smalled definitively benign, no further evaluation recommended.   Reported renal lesion: Per pt. Follows with Dr. Louis Meckel.    Pleural effusion: Chronic, multifactorial partially related to CHF and to hypoalbuminemia. Not sufficient for thoracentesis.   Chronic testosterone use: Continue AndroGel.  Physical deconditioning due to acute illness-plan is for SNF on discharge.  Patient wanting to know if he can go to CIR-we will touch base with CIR coordinator on Monday.  Nutrition Status: Nutrition Problem: Increased nutrient needs Etiology: wound healing Signs/Symptoms: estimated needs Interventions: MVI, Premier Protein     RN Pressure Injury Documentation: Pressure Injury 07/27/21 Sacrum Mid Unstageable - Full thickness tissue loss in which the base of the injury is covered by slough (yellow, tan, gray, green or brown) and/or eschar (tan, brown or black) in the wound bed. small pink pressure ulcer with bre (Active)  07/27/21 0255  Location: Sacrum  Location Orientation: Mid  Staging: Unstageable - Full thickness tissue loss in which the base of the injury is covered by slough (yellow, tan, gray, green or brown) and/or eschar (tan, brown or black) in the wound bed.  Wound Description (Comments): small pink pressure ulcer with break in skin 08/12/21; updated to unstageable pressure injury  Present on Admission: Yes    DVT prophylaxis: Eliquis Code Status: Full Family Communication: None at bedside. Disposition Plan:  Status is: Inpatient  Remains inpatient appropriate because: Needs ongoing wound care with hydrotherapy-denied by LTAC-not a candidate for inpatient rehab-we will eventually need SNF.  Discussed with patient on 12/22-he is reluctant but does understand.  On 12/24-inquiring if he can be reassessed by  CIR-see above.  We will touch base with case management/nursing leadership on 12/26.  Consultants:  ID Neurosurgery PCCM Cardiology  Procedures:  Left TTA 07/23/2021 TEE/DCCV 07/29/2021 PICC RUE 08/13/2021  Antimicrobials: Cefazolin 11/16 Ceftriaxone 11/14, 11/16-17 Clindamycin 11/15-16 Linezolid 11/15 Pen G 11/18 Pip-tazo 11/15-11/16 Vancomycin 11/14 Cefepime 12/8 - 12/12  Data Reviewed: I have personally reviewed following labs and imaging studies  CBC: Recent Labs  Lab 08/28/21 0517 08/30/21 0624 09/01/21 0148  WBC 7.6 7.8 10.7*  HGB 11.9* 12.2* 10.8*  HCT 36.9* 37.7* 33.4*  MCV 90.0 90.8 91.5  PLT 212 212 505    Basic Metabolic Panel: Recent Labs  Lab 08/28/21 0517 08/30/21 0624 09/01/21 0148  NA 131* 136 133*  K 3.9 3.9 4.0  CL 97* 99 100  CO2 28 30 27   GLUCOSE 187* 166* 127*  BUN 29* 22* 28*  CREATININE 1.27* 1.12 1.43*  CALCIUM 8.3* 8.8* 7.8*    GFR: Estimated Creatinine Clearance: 77.2 mL/min (A) (by C-G formula based on SCr of 1.43 mg/dL (H)). Liver Function Tests: No results for input(s): AST, ALT, ALKPHOS, BILITOT, PROT, ALBUMIN in the last 168 hours.  No results for input(s): LIPASE, AMYLASE in the last 168 hours. No results for input(s): AMMONIA in the last 168 hours. Coagulation Profile: No results for input(s): INR, PROTIME in the last 168 hours. Cardiac Enzymes: No results for input(s): CKTOTAL, CKMB, CKMBINDEX, TROPONINI in the last 168 hours. BNP (last 3 results) No results for input(s): PROBNP in the last 8760 hours. HbA1C: No results for input(s): HGBA1C in the last 72 hours. CBG: Recent Labs  Lab 08/31/21 1111 08/31/21 1525 08/31/21 1613 08/31/21 2113 09/01/21 0653  GLUCAP 189* 110* 137* 133* 165*    Lipid Profile: No results for input(s): CHOL, HDL, LDLCALC, TRIG, CHOLHDL, LDLDIRECT in the last 72 hours. Thyroid Function Tests: No results  for input(s): TSH, T4TOTAL, FREET4, T3FREE, THYROIDAB in the last 72  hours. Anemia Panel: No results for input(s): VITAMINB12, FOLATE, FERRITIN, TIBC, IRON, RETICCTPCT in the last 72 hours.  Urine analysis:    Component Value Date/Time   COLORURINE YELLOW 07/22/2021 0250   APPEARANCEUR CLOUDY (A) 07/22/2021 0250   LABSPEC 1.018 07/22/2021 0250   PHURINE 5.0 07/22/2021 0250   GLUCOSEU >=500 (A) 07/22/2021 0250   HGBUR SMALL (A) 07/22/2021 0250   BILIRUBINUR NEGATIVE 07/22/2021 0250   KETONESUR 5 (A) 07/22/2021 0250   PROTEINUR 30 (A) 07/22/2021 0250   UROBILINOGEN 0.2 09/13/2019 0906   NITRITE NEGATIVE 07/22/2021 0250   LEUKOCYTESUR TRACE (A) 07/22/2021 0250   No results found for this or any previous visit (from the past 240 hour(s)).    Radiology Studies: No results found.  Scheduled Meds:  acetaminophen  1,000 mg Oral TID   amiodarone  200 mg Oral Daily   apixaban  5 mg Oral BID   vitamin C  1,000 mg Oral Daily   [START ON 09/02/2021] cefadroxil  1,000 mg Oral BID   Chlorhexidine Gluconate Cloth  6 each Topical Daily   [START ON 09/03/2021] collagenase   Topical Daily   finasteride  5 mg Oral Daily   gabapentin  400 mg Oral TID   hydrALAZINE  25 mg Oral Q8H   insulin aspart  0-15 Units Subcutaneous TID WC   insulin aspart  0-5 Units Subcutaneous QHS   insulin aspart  6 Units Subcutaneous TID WC   insulin detemir  36 Units Subcutaneous BID   isosorbide mononitrate  30 mg Oral Daily   methocarbamol  1,000 mg Oral Q8H   metoprolol succinate  50 mg Oral Daily   multivitamin with minerals  1 tablet Oral Daily   nystatin cream   Topical BID   oxyCODONE  20 mg Oral Q12H   polyethylene glycol  17 g Oral Daily   Ensure Max Protein  11 oz Oral BID BM   silver nitrate applicators  1 application Topical Once   sodium chloride flush  10-40 mL Intracatheter Q12H   tamsulosin  0.4 mg Oral Daily   testosterone  5 g Transdermal Daily   [START ON 09/03/2021] torsemide  20 mg Oral QODAY   Continuous Infusions:  sodium chloride Stopped (08/28/21  1720)   vancomycin 1,000 mg (09/01/21 0659)     LOS: 41 days    Oren Binet, MD [facility] [email or web address] 09/01/2021, 10:44 AM

## 2021-09-01 NOTE — Plan of Care (Signed)
°  Problem: Education: Goal: Knowledge of General Education information will improve Description: Including pain rating scale, medication(s)/side effects and non-pharmacologic comfort measures Outcome: Progressing   Problem: Health Behavior/Discharge Planning: Goal: Ability to manage health-related needs will improve Outcome: Progressing   Problem: Clinical Measurements: Goal: Ability to maintain clinical measurements within normal limits will improve Outcome: Progressing Goal: Diagnostic test results will improve Outcome: Progressing Goal: Respiratory complications will improve Outcome: Progressing Goal: Cardiovascular complication will be avoided Outcome: Progressing   Problem: Nutrition: Goal: Adequate nutrition will be maintained Outcome: Progressing   Problem: Coping: Goal: Level of anxiety will decrease Outcome: Progressing   Problem: Elimination: Goal: Will not experience complications related to bowel motility Outcome: Progressing Goal: Will not experience complications related to urinary retention Outcome: Progressing   Problem: Pain Managment: Goal: General experience of comfort will improve Outcome: Progressing   Problem: Safety: Goal: Ability to remain free from injury will improve Outcome: Progressing   Problem: Skin Integrity: Goal: Risk for impaired skin integrity will decrease Outcome: Progressing   Problem: Education: Goal: Knowledge of disease or condition will improve Outcome: Progressing Goal: Understanding of medication regimen will improve Outcome: Progressing Goal: Individualized Educational Video(s) Outcome: Progressing   Problem: Activity: Goal: Ability to tolerate increased activity will improve Outcome: Progressing   Problem: Cardiac: Goal: Ability to achieve and maintain adequate cardiopulmonary perfusion will improve Outcome: Progressing   Problem: Health Behavior/Discharge Planning: Goal: Ability to safely manage  health-related needs after discharge will improve Outcome: Progressing   Problem: Education: Goal: Knowledge of General Education information will improve Description: Including pain rating scale, medication(s)/side effects and non-pharmacologic comfort measures Outcome: Progressing   Problem: Health Behavior/Discharge Planning: Goal: Ability to manage health-related needs will improve Outcome: Progressing   Problem: Clinical Measurements: Goal: Ability to maintain clinical measurements within normal limits will improve Outcome: Progressing Goal: Will remain free from infection Outcome: Progressing Goal: Diagnostic test results will improve Outcome: Progressing Goal: Respiratory complications will improve Outcome: Progressing Goal: Cardiovascular complication will be avoided Outcome: Progressing   Problem: Activity: Goal: Risk for activity intolerance will decrease Outcome: Progressing   Problem: Nutrition: Goal: Adequate nutrition will be maintained Outcome: Progressing   Problem: Coping: Goal: Level of anxiety will decrease Outcome: Progressing   Problem: Elimination: Goal: Will not experience complications related to bowel motility Outcome: Progressing Goal: Will not experience complications related to urinary retention Outcome: Progressing   Problem: Safety: Goal: Ability to remain free from injury will improve Outcome: Progressing   Problem: Skin Integrity: Goal: Risk for impaired skin integrity will decrease Outcome: Progressing   Problem: Clinical Measurements: Goal: Will remain free from infection Outcome: Not Progressing   Problem: Activity: Goal: Risk for activity intolerance will decrease Outcome: Not Progressing   Problem: Pain Managment: Goal: General experience of comfort will improve Outcome: Not Progressing

## 2021-09-02 DIAGNOSIS — I4892 Unspecified atrial flutter: Secondary | ICD-10-CM | POA: Diagnosis not present

## 2021-09-02 DIAGNOSIS — M4644 Discitis, unspecified, thoracic region: Secondary | ICD-10-CM | POA: Diagnosis not present

## 2021-09-02 DIAGNOSIS — N179 Acute kidney failure, unspecified: Secondary | ICD-10-CM | POA: Diagnosis not present

## 2021-09-02 DIAGNOSIS — A419 Sepsis, unspecified organism: Secondary | ICD-10-CM | POA: Diagnosis not present

## 2021-09-02 LAB — CBC
HCT: 35.3 % — ABNORMAL LOW (ref 39.0–52.0)
Hemoglobin: 11.1 g/dL — ABNORMAL LOW (ref 13.0–17.0)
MCH: 28.8 pg (ref 26.0–34.0)
MCHC: 31.4 g/dL (ref 30.0–36.0)
MCV: 91.5 fL (ref 80.0–100.0)
Platelets: 202 10*3/uL (ref 150–400)
RBC: 3.86 MIL/uL — ABNORMAL LOW (ref 4.22–5.81)
RDW: 14.6 % (ref 11.5–15.5)
WBC: 8.9 10*3/uL (ref 4.0–10.5)
nRBC: 0 % (ref 0.0–0.2)

## 2021-09-02 LAB — GLUCOSE, CAPILLARY
Glucose-Capillary: 137 mg/dL — ABNORMAL HIGH (ref 70–99)
Glucose-Capillary: 283 mg/dL — ABNORMAL HIGH (ref 70–99)
Glucose-Capillary: 196 mg/dL — ABNORMAL HIGH (ref 70–99)
Glucose-Capillary: 133 mg/dL — ABNORMAL HIGH (ref 70–99)
Glucose-Capillary: 134 mg/dL — ABNORMAL HIGH (ref 70–99)

## 2021-09-02 LAB — BASIC METABOLIC PANEL
Anion gap: 7 (ref 5–15)
BUN: 29 mg/dL — ABNORMAL HIGH (ref 6–20)
CO2: 30 mmol/L (ref 22–32)
Calcium: 8.6 mg/dL — ABNORMAL LOW (ref 8.9–10.3)
Chloride: 98 mmol/L (ref 98–111)
Creatinine, Ser: 1.39 mg/dL — ABNORMAL HIGH (ref 0.61–1.24)
GFR, Estimated: 60 mL/min — ABNORMAL LOW (ref >60)
Glucose, Bld: 120 mg/dL — ABNORMAL HIGH (ref 70–99)
Potassium: 4 mmol/L (ref 3.5–5.1)
Sodium: 135 mmol/L (ref 135–145)

## 2021-09-02 MED ORDER — HYDRALAZINE HCL 25 MG PO TABS
25.0000 mg | ORAL_TABLET | Freq: Two times a day (BID) | ORAL | Status: DC
  Administered 2021-09-03 – 2021-09-04 (×3): 25 mg via ORAL
  Filled 2021-09-02 (×4): qty 1

## 2021-09-02 MED ORDER — TORSEMIDE 10 MG PO TABS
10.0000 mg | ORAL_TABLET | ORAL | Status: DC
  Administered 2021-09-03: 12:00:00 10 mg via ORAL
  Filled 2021-09-02 (×2): qty 1

## 2021-09-02 NOTE — Progress Notes (Signed)
PT Cancellation Note  Patient Details Name: [name] MRN: [id number] DOB: 06-11-1966   Cancelled Treatment:    Reason Eval/Treat Not Completed: Patient declined, no reason specified. PT attempted x 2. Pt declined first attempt stating he was just not ready, and second attempt due to waiting on RN for dressing change.    Lorriane Shire 09/02/2021, 11:30 AM  Lorrin Goodell, PT  Office # [phone] Pager [phone]

## 2021-09-02 NOTE — Progress Notes (Signed)
Patient requested reevaluation by neurosurgery given his continued and somewhat increased midthoracic back pain.  At this time his neurologic exam remained stable, has good strength and sensation in his bilateral lower extremities.  Of note he has bilateral BKA.  His back pain is in the mid/upper thoracic region, in the midline region.  Does complain of worsening pain when he uses his arms to lift himself up.  He does not complain of changes in sensation.  I agree with continued medical management.  His MRI most recently on 08/22/2021 shows improvement compared to prior with moderate stenosis without cord signal change.  Follow-up with Dr. Ronnald Ramp as an outpatient.   Thank you for allowing me to participate in this patient's care.  Please do not hesitate to call with questions or concerns.   Elwin Sleight, [facility]

## 2021-09-02 NOTE — Progress Notes (Addendum)
PROGRESS NOTE  [name]  CWC:[id number] DOB: 1965-12-23 DOA: 07/21/2021 PCP: Patient, No Pcp Per (Inactive)   Brief Narrative: 55 year old male with history of DM-2, prior right BKA, HFpEF, hypertension, dyslipidemia presented with left diabetic foot-resulting in septic shock with group B streptococcal bacteremia.  He was initially managed in the ICU-orthopedics performed left transtibial amputation on 11/6.  Hospital course was prolonged and complicated by thoracic discitis/osteomyelitis/epidural abscess, new diagnosis of HFrEF, atrial flutter, sacral decubitus ulcer.  He was evaluated by cardiology, neurosurgery, and infectious disease.  See below for further details. (note-MD assumed care from 12/21)  Subjective: Anxious-as he claims his back pain is worsening.  No focal deficits.  Wants to talk with neurosurgery.  Note-rounded with bedside RN.  Objective: Vitals: Blood pressure 115/63, pulse 72, temperature 98.2 F (36.8 C), temperature source Oral, resp. rate 17, height 6\' 3"  (1.905 m), weight 107 kg, SpO2 96 %.   Physical exam: Gen Exam:Alert awake-not in any distress HEENT:atraumatic, normocephalic Chest: B/L clear to auscultation anteriorly CVS:S1S2 regular Abdomen:soft non tender, non distended Extremities:no edema Neurology: Non focal Skin: no rash   Assessment & Plan: Septic shock due to left diabetic foot infection, Streptococcus agalactiae bacteremia and thoracic discitis: Sepsis physiology has resolved-did require ICU stay during the early part of this hospitalization for pressors.  No longer on IV vancomycin-has been transitioned to oral cefadroxil-patient is s/p left BKA.     Left diabetic foot infection: S/p left BKA on 11/16-wound VAC has been removed-Staples removed on 12/16.  Recommendations from orthopedics for dry dressing.  Group B streptococcus bacteremia: No vegetation seen on TEE-repeat blood cultures on 11/17 negative so far.  ID  following-recommendations are to stop vancomycin and transition to cefadroxil 1 g twice daily for 4 weeks from 12/27.  Remains afebrile-no leukocytosis.  Acute thoracic discitis/osteomyelitis (T6-T7 )with epidural abscess: Exam appears unchanged/stable for the past few days-complains of some worsening upper back pain.  Last MRI on 12/16 had shown improvement-patient appears to be very anxious-given lack of fever/leukocytosis-benign neurological exam-attempted to provide reassurance-however at patient's request-we will touch base with neurosurgery (awaiting callback)  Full-thickness sacral decubitus ulcer: Wound care team following-continue hydrotherapy.    New onset systolic heart failure: Volume status stable. BP was somewhat soft on 12/26-medication dosage of torsemide/metoprolol/BiDil have been adjusted.  Per cardiology note-not a candidate for ACE/ARB/ARNI due to renal dysfunction.  Plans are to follow with cardiology in the outpatient setting.  New onset atrial flutter-s/p TEE/DCCV on 11/12-maintaining sinus rhythm-on amiodarone/Eliquis.  Cardiology no longer following.  Will need to follow-up with cardiology in the outpatient setting.  AKI on CKD stage IIIa: AKI hemodynamically mediated-creatinine had improved.   Acute urinary retention: Foley catheter removed on 12/19-voiding spontaneously.  Continue Flomax.  Acute on chronic pain syndrome: Back pain stable today-continue Neurontin, OxyContin-and short acting oxycodone/Dilaudid for breakthrough pain.    Follows with pain management-Dr. Nelva Bush in the outpatient setting.  On bowel regimen with MiraLAX and other as needed laxatives.  Insomnia: Continue trazodone  DM-2 (A1c 13.8 on 11/15) with uncontrolled hyperglycemia: CBGs stable-continue Levemir 36 units twice daily, 6 units of NovoLog with meals and SSI.  Recent Labs    09/01/21 2044 09/02/21 0811 09/02/21 1115  GLUCAP 142* 283* 196*       Liver nodularity: Noted incidentally on  CT chest 04/09/2021-stable for outpatient follow-up with PCP.   Multiple small bilateral pulmonary nodules:  Unchanged on last CT chest 90mm and smalled definitively benign, no further evaluation recommended.   Reported  renal lesion: Per pt. Follows with Dr. Louis Meckel.    Pleural effusion: Chronic, multifactorial partially related to CHF and to hypoalbuminemia. Not sufficient for thoracentesis.   Chronic testosterone use: Continue AndroGel.  Physical deconditioning due to acute illness: SNF versus CIR-have talked with transition of care team today.  Nutrition Status: Nutrition Problem: Increased nutrient needs Etiology: wound healing Signs/Symptoms: estimated needs Interventions: MVI, Premier Protein     RN Pressure Injury Documentation: Pressure Injury 07/27/21 Sacrum Mid Unstageable - Full thickness tissue loss in which the base of the injury is covered by slough (yellow, tan, gray, green or brown) and/or eschar (tan, brown or black) in the wound bed. small pink pressure ulcer with bre (Active)  07/27/21 0255  Location: Sacrum  Location Orientation: Mid  Staging: Unstageable - Full thickness tissue loss in which the base of the injury is covered by slough (yellow, tan, gray, green or brown) and/or eschar (tan, brown or black) in the wound bed.  Wound Description (Comments): small pink pressure ulcer with break in skin 08/12/21; updated to unstageable pressure injury  Present on Admission: Yes    DVT prophylaxis: Eliquis Code Status: Full Family Communication: None at bedside. Disposition Plan:  Status is: Inpatient  Remains inpatient appropriate because: Needs ongoing wound care with hydrotherapy-denied by LTAC-not a candidate for inpatient rehab-we will eventually need SNF.  Discussed with patient on 12/22-he is reluctant but does understand.  On 12/24-inquiring if he can be reassessed by CIR-see above.  We will touch base with case management/nursing leadership on  12/26.  Consultants:  ID Neurosurgery PCCM Cardiology  Procedures:  Left TTA 07/23/2021 TEE/DCCV 07/29/2021 PICC RUE 08/13/2021  Antimicrobials: Cefazolin 11/16 Ceftriaxone 11/14, 11/16-17 Clindamycin 11/15-16 Linezolid 11/15 Pen G 11/18 Pip-tazo 11/15-11/16 Vancomycin 11/14 Cefepime 12/8 - 12/12  Data Reviewed: I have personally reviewed following labs and imaging studies  CBC: Recent Labs  Lab 08/28/21 0517 08/30/21 0624 09/01/21 0148 09/02/21 0352  WBC 7.6 7.8 10.7* 8.9  HGB 11.9* 12.2* 10.8* 11.1*  HCT 36.9* 37.7* 33.4* 35.3*  MCV 90.0 90.8 91.5 91.5  PLT 212 212 212 179    Basic Metabolic Panel: Recent Labs  Lab 08/28/21 0517 08/30/21 0624 09/01/21 0148 09/02/21 0352  NA 131* 136 133* 135  K 3.9 3.9 4.0 4.0  CL 97* 99 100 98  CO2 28 30 27 30   GLUCOSE 187* 166* 127* 120*  BUN 29* 22* 28* 29*  CREATININE 1.27* 1.12 1.43* 1.39*  CALCIUM 8.3* 8.8* 7.8* 8.6*    GFR: Estimated Creatinine Clearance: 79.4 mL/min (A) (by C-G formula based on SCr of 1.39 mg/dL (H)). Liver Function Tests: No results for input(s): AST, ALT, ALKPHOS, BILITOT, PROT, ALBUMIN in the last 168 hours.  No results for input(s): LIPASE, AMYLASE in the last 168 hours. No results for input(s): AMMONIA in the last 168 hours. Coagulation Profile: No results for input(s): INR, PROTIME in the last 168 hours. Cardiac Enzymes: No results for input(s): CKTOTAL, CKMB, CKMBINDEX, TROPONINI in the last 168 hours. BNP (last 3 results) No results for input(s): PROBNP in the last 8760 hours. HbA1C: No results for input(s): HGBA1C in the last 72 hours. CBG: Recent Labs  Lab 09/01/21 1118 09/01/21 1722 09/01/21 2044 09/02/21 0811 09/02/21 1115  GLUCAP 137* 83 142* 283* 196*    Lipid Profile: No results for input(s): CHOL, HDL, LDLCALC, TRIG, CHOLHDL, LDLDIRECT in the last 72 hours. Thyroid Function Tests: No results for input(s): TSH, T4TOTAL, FREET4, T3FREE, THYROIDAB in the last  72 hours.  Anemia Panel: No results for input(s): VITAMINB12, FOLATE, FERRITIN, TIBC, IRON, RETICCTPCT in the last 72 hours.  Urine analysis:    Component Value Date/Time   COLORURINE YELLOW 07/22/2021 0250   APPEARANCEUR CLOUDY (A) 07/22/2021 0250   LABSPEC 1.018 07/22/2021 0250   PHURINE 5.0 07/22/2021 0250   GLUCOSEU >=500 (A) 07/22/2021 0250   HGBUR SMALL (A) 07/22/2021 0250   BILIRUBINUR NEGATIVE 07/22/2021 0250   KETONESUR 5 (A) 07/22/2021 0250   PROTEINUR 30 (A) 07/22/2021 0250   UROBILINOGEN 0.2 09/13/2019 0906   NITRITE NEGATIVE 07/22/2021 0250   LEUKOCYTESUR TRACE (A) 07/22/2021 0250   No results found for this or any previous visit (from the past 240 hour(s)).    Radiology Studies: No results found.  Scheduled Meds:  acetaminophen  1,000 mg Oral TID   amiodarone  200 mg Oral Daily   apixaban  5 mg Oral BID   vitamin C  1,000 mg Oral Daily   cefadroxil  1,000 mg Oral BID   Chlorhexidine Gluconate Cloth  6 each Topical Daily   [START ON 09/03/2021] collagenase   Topical Daily   finasteride  5 mg Oral Daily   gabapentin  400 mg Oral TID   hydrALAZINE  25 mg Oral Q8H   insulin aspart  0-15 Units Subcutaneous TID WC   insulin aspart  0-5 Units Subcutaneous QHS   insulin aspart  6 Units Subcutaneous TID WC   insulin detemir  36 Units Subcutaneous BID   isosorbide mononitrate  15 mg Oral Daily   methocarbamol  1,000 mg Oral Q8H   metoprolol succinate  25 mg Oral Daily   multivitamin with minerals  1 tablet Oral Daily   nystatin cream   Topical BID   oxyCODONE  20 mg Oral Q12H   polyethylene glycol  17 g Oral Daily   Ensure Max Protein  11 oz Oral BID BM   silver nitrate applicators  1 application Topical Once   sodium chloride flush  10-40 mL Intracatheter Q12H   tamsulosin  0.4 mg Oral Daily   testosterone  5 g Transdermal Daily   [START ON 09/03/2021] torsemide  20 mg Oral QODAY   Continuous Infusions:  sodium chloride Stopped (08/28/21 1720)     LOS: 42  days    Oren Binet, MD [facility] [email or web address] 09/02/2021, 11:24 AM

## 2021-09-02 NOTE — Progress Notes (Signed)
[facility] consulted about possibly removing patient's PICC. After team discussion, Dr. Sloan Leiter agreed to  leave the PICC line in for the next few days-he just completed IV vancomycin-he is complicated-will be reevaluated by neurosurgery today.  Once he is stable on oral antibiotics for 48 hours or so-does not need any further surgical procedures-and then we can remove the PICC line. Unit RN and IV team aware.

## 2021-09-02 NOTE — Progress Notes (Signed)
Occupational Therapy Treatment Patient Details Name: [name] MRN: [id number] DOB: [birth year] Today's Date: 09/02/2021   History of present illness Pt is a 55 y.o. 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 S/p TEE, DCCV on 11/22. Chest CT 12/4 with pleural effusion; thoracentesis attempted with insufficient fluid volume. PMH includes R BKA, aflutter, DM, HTN, depression, anxiety.   OT comments  Patient seen by skilled OT to address bed mobility and chair transfers. Patient was able to get to EOB with HOB raised and min assist +2.  Lateral scoot transfer performed to recliner with use of bed pad and mod assist +2.  Patient was positioned on recliner to allow for sacral area to be floated off surface and patient stated comfort. OT to return to assist back to bed.    Recommendations for follow up therapy are one component of a multi-disciplinary discharge planning process, led by the attending physician.  Recommendations may be updated based on patient status, additional functional criteria and insurance authorization.    Follow Up Recommendations  Skilled nursing-short term rehab (<3 hours/day)    Assistance Recommended at Discharge Frequent or constant Supervision/Assistance  Equipment Recommendations  None recommended by OT    Recommendations for Other Services      Precautions / Restrictions Precautions Precautions: Fall;Back Precaution Comments: Back precautions for comfort Restrictions Weight Bearing Restrictions: No LLE Weight Bearing: Non weight bearing       Mobility Bed Mobility Overal bed mobility: Needs Assistance Bed Mobility: Supine to Sit     Supine to sit: Min assist;+2 for physical assistance;HOB elevated     General bed mobility  comments: min assist +2 for safety and to stedy patient    Transfers Overall transfer level: Needs assistance Equipment used: None Transfers: Bed to chair/wheelchair/BSC            Lateral/Scoot Transfers: Mod assist;+2 physical assistance;+2 safety/equipment General transfer comment: mod assist +2 for lateral transfer due to positioning patient in chair     Balance Overall balance assessment: Needs assistance Sitting-balance support: Single extremity supported;Bilateral upper extremity supported Sitting balance-Leahy Scale: Poor Sitting balance - Comments: min assist for balance sitting on EOB                                   ADL either performed or assessed with clinical judgement   ADL                                              Extremity/Trunk Assessment              Vision       Perception     Praxis      Cognition Arousal/Alertness: Awake/alert Behavior During Therapy: WFL for tasks assessed/performed Overall Cognitive Status: No family/caregiver present to determine baseline cognitive functioning Area of Impairment: Attention;Safety/judgement;Awareness;Problem solving                   Current Attention Level: Selective     Safety/Judgement: Decreased awareness of deficits Awareness: Emergent Problem Solving: Requires verbal cues;Difficulty sequencing General Comments: Patiet tolerated 25 minutes up in chair  Exercises     Shoulder Instructions       General Comments      Pertinent Vitals/ Pain       Pain Assessment: Faces Faces Pain Scale: Hurts even more Pain Location: back and wound site Pain Descriptors / Indicators: Grimacing;Guarding;Moaning Pain Intervention(s): Limited activity within patient's tolerance;Monitored during session;Repositioned;Patient requesting pain meds-RN notified  Home Living                                          Prior  Functioning/Environment              Frequency  Min 3X/week        Progress Toward Goals  OT Goals(current goals can now be found in the care plan section)  Progress towards OT goals: Progressing toward goals  Acute Rehab OT Goals Patient Stated Goal: go to rehab OT Goal Formulation: With patient Time For Goal Achievement: 09/05/21 Potential to Achieve Goals: Good ADL Goals Pt Will Perform Grooming: sitting;with supervision Pt Will Perform Upper Body Bathing: with supervision;sitting Pt Will Perform Lower Body Dressing: with min assist;bed level;sitting/lateral leans Pt Will Transfer to Toilet: with mod assist;bedside commode;anterior/posterior transfer;with transfer board Pt/caregiver will Perform Home Exercise Program: Increased ROM;Left upper extremity;With written HEP provided Additional ADL Goal #1: Pt will complete bed mobility with mod assist as precursor to ADLs.  Plan Discharge plan remains appropriate    Co-evaluation                 AM-PAC OT "6 Clicks" Daily Activity     Outcome Measure   Help from another person eating meals?: A Little Help from another person taking care of personal grooming?: A Little Help from another person toileting, which includes using toliet, bedpan, or urinal?: A Lot Help from another person bathing (including washing, rinsing, drying)?: A Lot Help from another person to put on and taking off regular upper body clothing?: A Little Help from another person to put on and taking off regular lower body clothing?: A Lot 6 Click Score: 15    End of Session    OT Visit Diagnosis: Pain;Muscle weakness (generalized) (M62.81) Pain - Right/Left: Left Pain - part of body: Hip   Activity Tolerance Patient tolerated treatment well   Patient Left in chair;with call bell/phone within reach   Nurse Communication Mobility status        Time: 1400-1409 OT Time Calculation (min): 9 min  Charges: OT General Charges $OT Visit:  1 Visit OT Treatments $Therapeutic Activity: 8-22 mins  [name], OTA Acute Rehabilitation Services  Pager [phone] Office [place] 09/02/2021, 2:51 PM

## 2021-09-02 NOTE — Progress Notes (Signed)
Occupational Therapy Treatment Patient Details Name: [name] MRN: [id number] DOB: June 10, 1966 Today's Date: 09/02/2021   History of present illness Pt is a 55 y.o. 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 S/p TEE, DCCV on 11/22. Chest CT 12/4 with pleural effusion; thoracentesis attempted with insufficient fluid volume. PMH includes R BKA, aflutter, DM, HTN, depression, anxiety.   OT comments  OT returned to assist patient back to bed following 25 minutes of sitting up in chair with sacral area floated to decrease pain. Patient stated he needed to go back to bed due to pain at wound site but sitting was more tolerable than past attempts. Patient required increased assistance for returning back to bed due to increase back pain as well as pain at wound. Acute OT to continue to focus on functional transfers and increasing out of bed sitting tolerance.    Recommendations for follow up therapy are one component of a multi-disciplinary discharge planning process, led by the attending physician.  Recommendations may be updated based on patient status, additional functional criteria and insurance authorization.    Follow Up Recommendations  Skilled nursing-short term rehab (<3 hours/day)    Assistance Recommended at Discharge Frequent or constant Supervision/Assistance  Equipment Recommendations  None recommended by OT    Recommendations for Other Services      Precautions / Restrictions Precautions Precautions: Fall;Back Precaution Comments: Back precautions for comfort Restrictions Weight Bearing Restrictions: No LLE Weight Bearing: Non weight bearing       Mobility Bed Mobility Overal bed mobility: Needs Assistance Bed Mobility: Sit to Supine     Supine to  sit: Min assist;+2 for physical assistance;HOB elevated Sit to supine: Mod assist;+2 for physical assistance   General bed mobility comments: mod assist +2 to return to bed due to increased pain    Transfers Overall transfer level: Needs assistance Equipment used: None Transfers: Bed to chair/wheelchair/BSC            Lateral/Scoot Transfers: Mod assist;Max assist;+2 physical assistance;+2 safety/equipment General transfer comment: mod/max assist +2 to return to bed due to increased back pain     Balance Overall balance assessment: Needs assistance Sitting-balance support: Single extremity supported;Bilateral upper extremity supported Sitting balance-Leahy Scale: Poor Sitting balance - Comments: mod assist for balance                                   ADL either performed or assessed with clinical judgement   ADL                                              Extremity/Trunk Assessment              Vision       Perception     Praxis      Cognition Arousal/Alertness: Awake/alert Behavior During Therapy: WFL for tasks assessed/performed Overall Cognitive Status: No family/caregiver present to determine baseline cognitive functioning Area of Impairment: Attention;Safety/judgement;Awareness;Problem solving                   Current Attention Level: Selective     Safety/Judgement: Decreased awareness of deficits  Awareness: Emergent Problem Solving: Requires verbal cues;Difficulty sequencing General Comments: Patient tolerated 25 minutes up in chair but states pain was more tolerable than previous attemps          Exercises     Shoulder Instructions       General Comments      Pertinent Vitals/ Pain       Pain Assessment: Faces Faces Pain Scale: Hurts even more Pain Location: back and wound site Pain Descriptors / Indicators: Grimacing;Guarding;Moaning Pain Intervention(s): Limited activity within patient's  tolerance;Monitored during session;Repositioned  Home Living                                          Prior Functioning/Environment              Frequency  Min 3X/week        Progress Toward Goals  OT Goals(current goals can now be found in the care plan section)  Progress towards OT goals: Progressing toward goals  Acute Rehab OT Goals Patient Stated Goal: go to rehab OT Goal Formulation: With patient Time For Goal Achievement: 09/05/21 Potential to Achieve Goals: Good ADL Goals Pt Will Perform Grooming: sitting;with supervision Pt Will Perform Upper Body Bathing: with supervision;sitting Pt Will Perform Lower Body Dressing: with min assist;bed level;sitting/lateral leans Pt Will Transfer to Toilet: with mod assist;bedside commode;anterior/posterior transfer;with transfer board Pt/caregiver will Perform Home Exercise Program: Increased ROM;Left upper extremity;With written HEP provided Additional ADL Goal #1: Pt will complete bed mobility with mod assist as precursor to ADLs.  Plan Discharge plan remains appropriate    Co-evaluation                 AM-PAC OT "6 Clicks" Daily Activity     Outcome Measure   Help from another person eating meals?: A Little Help from another person taking care of personal grooming?: A Little Help from another person toileting, which includes using toliet, bedpan, or urinal?: A Lot Help from another person bathing (including washing, rinsing, drying)?: A Lot Help from another person to put on and taking off regular upper body clothing?: A Little Help from another person to put on and taking off regular lower body clothing?: A Lot 6 Click Score: 15    End of Session    OT Visit Diagnosis: Pain;Muscle weakness (generalized) (M62.81) Pain - Right/Left: Left Pain - part of body: Hip   Activity Tolerance Patient limited by pain   Patient Left in bed;with call bell/phone within reach   Nurse Communication  Mobility status;Patient requests pain meds        Time: 8119-1478 OT Time Calculation (min): 11 min  Charges: OT General Charges $OT Visit: 1 Visit OT Treatments $Therapeutic Activity: 8-22 mins  [name], OTA Acute Rehabilitation Services  Pager [phone] Office [phone]   Trixie Dredge 09/02/2021, 3:21 PM

## 2021-09-03 DIAGNOSIS — I4892 Unspecified atrial flutter: Secondary | ICD-10-CM | POA: Diagnosis not present

## 2021-09-03 DIAGNOSIS — N179 Acute kidney failure, unspecified: Secondary | ICD-10-CM | POA: Diagnosis not present

## 2021-09-03 DIAGNOSIS — M4644 Discitis, unspecified, thoracic region: Secondary | ICD-10-CM | POA: Diagnosis not present

## 2021-09-03 DIAGNOSIS — A419 Sepsis, unspecified organism: Secondary | ICD-10-CM | POA: Diagnosis not present

## 2021-09-03 LAB — GLUCOSE, CAPILLARY
Glucose-Capillary: 181 mg/dL — ABNORMAL HIGH (ref 70–99)
Glucose-Capillary: 192 mg/dL — ABNORMAL HIGH (ref 70–99)
Glucose-Capillary: 147 mg/dL — ABNORMAL HIGH (ref 70–99)
Glucose-Capillary: 98 mg/dL (ref 70–99)

## 2021-09-03 MED ORDER — OXYCODONE HCL 5 MG PO TABS
15.0000 mg | ORAL_TABLET | ORAL | Status: DC | PRN
  Administered 2021-09-03 – 2021-09-09 (×23): 15 mg via ORAL
  Filled 2021-09-03 (×24): qty 3

## 2021-09-03 MED ORDER — OXYCODONE HCL ER 15 MG PO T12A
30.0000 mg | EXTENDED_RELEASE_TABLET | Freq: Two times a day (BID) | ORAL | Status: DC
  Administered 2021-09-03: 23:00:00 30 mg via ORAL
  Filled 2021-09-03: qty 2

## 2021-09-03 NOTE — Progress Notes (Signed)
Nutrition Follow-up  DOCUMENTATION CODES:   Not applicable  INTERVENTION:   Encourage good PO intake Continue Ensure Max po BID, each supplement provides 150 kcal and 30 grams of protein Continue MVI with minerals daily  NUTRITION DIAGNOSIS:   Increased nutrient needs related to wound healing as evidenced by estimated needs. - Ongoing, being addressed via oral nutrition supplements  GOAL:   Patient will meet greater than or equal to 90% of their needs - Progressing  MONITOR:   PO intake, Supplement acceptance, Labs, Weight trends, Skin, I & O's  REASON FOR ASSESSMENT:   Consult Assessment of nutrition requirement/status  ASSESSMENT:   54 yo male with a PMH of diabetes mellitus type 2, diabetic foot infection with prior right BKA, chronic diastolic CHF, hypertension, dyslipidemia presented with worsening left foot swelling/discoloration and discharge. Admitted with discitis of thoracic region.  Pt reports that his appetite is good and that he is eating well. Denies any nausea, vomiting, or diarrhea. Denies any difficulty wih ordering meals. Pt reports that he is drinking the supplements with no problem.  Per EMR, pt with 7 intakes recorded and all are 100%.  Pt reports that he has been a bit constipated due to opioids but is on a bowel regimen now.   Pt with no other questions or concerns at this time.   Medications reviewed and include: Vitamin C, Cefadroxil, SSI 0-15 units TID + 0-5 units daily + 6 units TID, Levemir 36 units, Robaxin, MVI, Oxycodone, Miralax, Torsemide Labs reviewed.  Diet Order:   Diet Order             Diet Carb Modified Fluid consistency: Thin; Room service appropriate? Yes  Diet effective now                   EDUCATION NEEDS:   Education needs have been addressed  Skin:  Skin Assessment: Skin Integrity Issues: Unstageable: coccyx/sacrum Incisions: L leg, closed (11/16)  Last BM:  09/02/2021  Height:   Ht Readings from Last 1  Encounters:  07/29/21 6\' 3"  (1.905 m)    Weight:   Wt Readings from Last 1 Encounters:  08/01/21 107 kg    BMI:  Body mass index is 29.48 kg/m.  Estimated Nutritional Needs:   Kcal:  2300-2500  Protein:  135-150 grams  Fluid:  >2.3 L   Roxana Hires, RD, LDN Clinical Dietitian See [facility] for contact information.

## 2021-09-03 NOTE — Plan of Care (Signed)
  Problem: Nutrition: Goal: Adequate nutrition will be maintained Outcome: Progressing   

## 2021-09-03 NOTE — Progress Notes (Signed)
PROGRESS NOTE  [name]  QIO:[id number] DOB: [birth year] DOA: 07/21/2021 PCP: Patient, No Pcp Per (Inactive)   Brief Narrative: 55 year old male with history of DM-2, prior right BKA, HFpEF, hypertension, dyslipidemia presented with left diabetic foot-resulting in septic shock with group B streptococcal bacteremia.  He was initially managed in the ICU-orthopedics performed left transtibial amputation on 11/6.  Hospital course was prolonged and complicated by thoracic discitis/osteomyelitis/epidural abscess, new diagnosis of HFrEF, atrial flutter, sacral decubitus ulcer.  He was evaluated by cardiology, neurosurgery, and infectious disease.  See below for further details. (note-this MD assumed care from 12/21)  Subjective: Continues to complain of pain in his back/sacral area.  Last BM was yesterday.  Note-rounded with bedside RN.  Objective: Vitals: Blood pressure (!) 146/67, pulse 75, temperature 98.2 F (36.8 C), temperature source Oral, resp. rate 17, height 6\' 3"  (1.905 m), weight 107 kg, SpO2 97 %.   Physical exam: Gen Exam:Alert awake-not in any distress HEENT:atraumatic, normocephalic Chest: B/L clear to auscultation anteriorly CVS:S1S2 regular Abdomen:soft non tender, non distended Extremities:no edema-s/p bilateral BKA. Neurology: Non focal Skin: no rash   Assessment & Plan: Septic shock due to left diabetic foot infection, Streptococcus agalactiae bacteremia and thoracic discitis: Sepsis physiology has resolved-did require ICU stay during the early part of this hospitalization for pressors.  No longer on IV vancomycin-has been transitioned to oral cefadroxil-patient is s/p left BKA.   Will re-assess on 12/29-if remains stable-PICC line can be discontinued.  Left diabetic foot infection: S/p left BKA on 11/16-wound VAC has been removed-Staples removed on 12/16.  Recommendations from orthopedics for dry dressing.  Group B streptococcus bacteremia: No vegetation seen on  TEE-repeat blood cultures on 11/17 negative so far.  ID following-no longer on vancomycin-has been transitioned to cefadroxil 1 g twice daily for 4 weeks from 12/27.  Remains afebrile-no leukocytosis.  Acute thoracic discitis/osteomyelitis (T6-T7 )with epidural abscess: Exam unchanged-reevaluated by neurosurgery on 12/27-recommendations are to continue supportive care.   Last MRI on 12/16 had shown improvement  Full-thickness sacral decubitus ulcer: Wound care team following-continue hydrotherapy.    New onset systolic heart failure: Volume status stable.  BP now stable-was soft a few days ago-dosage of torsemide/metoprolol/hydralazine and Imdur have been adjusted.    Per cardiology note-not a candidate for ACE/ARB/ARNI due to renal dysfunction.  Plans are to follow with cardiology in the outpatient setting.  New onset atrial flutter-s/p TEE/DCCV on 11/12-maintaining sinus rhythm-on amiodarone/Eliquis.  Cardiology no longer following.  Will need to follow-up with cardiology in the outpatient setting.  AKI on CKD stage IIIa: AKI hemodynamically mediated-creatinine had improved.   Acute urinary retention: Foley catheter removed on 12/19-voiding spontaneously.  Continue Flomax.  Acute on chronic pain syndrome: Continues to have back pain-we will increase OxyContin to 30 mg twice daily, increase short acting oxycodone to 15 mg, continue current dosing of Dilaudid and Neurontin.  Patient aware that once wound care issues stabilized-we will need to start limiting IV narcotic medications.  On bowel regimen with MiraLAX/other laxatives-his last BM per patient was yesterday.    Insomnia: Continue trazodone  DM-2 (A1c 13.8 on 11/15) with uncontrolled hyperglycemia: CBGs stable-continue Levemir 36 units twice daily, 6 units of NovoLog with meals and SSI.  Recent Labs    09/02/21 2106 09/03/21 0737 09/03/21 1201  GLUCAP 134* 181* 192*       Liver nodularity: Noted incidentally on CT chest  04/09/2021-stable for outpatient follow-up with PCP.   Multiple small bilateral pulmonary nodules:  Unchanged on last CT  chest 62mm and smalled definitively benign, no further evaluation recommended.   Reported renal lesion: Per pt. Follows with Dr. Louis Meckel.    Pleural effusion: Chronic, multifactorial partially related to CHF and to hypoalbuminemia. Not sufficient for thoracentesis.   Chronic testosterone use: Continue AndroGel.  Physical deconditioning due to acute illness: Spoke with CIR coordinator today-not a CIR candidate-see their note on 12/28.  We will need to go to SNF once hydrotherapy has been completed.  Nutrition Status: Nutrition Problem: Increased nutrient needs Etiology: wound healing Signs/Symptoms: estimated needs Interventions: MVI, Premier Protein     RN Pressure Injury Documentation: Pressure Injury 07/27/21 Sacrum Mid Unstageable - Full thickness tissue loss in which the base of the injury is covered by slough (yellow, tan, gray, green or brown) and/or eschar (tan, brown or black) in the wound bed. small pink pressure ulcer with bre (Active)  07/27/21 0255  Location: Sacrum  Location Orientation: Mid  Staging: Unstageable - Full thickness tissue loss in which the base of the injury is covered by slough (yellow, tan, gray, green or brown) and/or eschar (tan, brown or black) in the wound bed.  Wound Description (Comments): small pink pressure ulcer with break in skin 08/12/21; updated to unstageable pressure injury  Present on Admission: Yes    DVT prophylaxis: Eliquis Code Status: Full Family Communication: None at bedside. Disposition Plan:  Status is: Inpatient  Remains inpatient appropriate because: Needs ongoing wound care with hydrotherapy-denied by LTAC-not a candidate for inpatient rehab-we will eventually need SNF.  Discussed with patient on 12/22-he is reluctant but does understand.  On 12/24-inquiring if he can be reassessed by CIR-see above.  We will  touch base with case management/nursing leadership on 12/26.  Consultants:  ID Neurosurgery PCCM Cardiology  Procedures:  Left TTA 07/23/2021 TEE/DCCV 07/29/2021 PICC RUE 08/13/2021  Antimicrobials: Cefazolin 11/16 Ceftriaxone 11/14, 11/16-17 Clindamycin 11/15-16 Linezolid 11/15 Pen G 11/18 Pip-tazo 11/15-11/16 Vancomycin 11/14 Cefepime 12/8 - 12/12  Data Reviewed: I have personally reviewed following labs and imaging studies  CBC: Recent Labs  Lab 08/28/21 0517 08/30/21 0624 09/01/21 0148 09/02/21 0352  WBC 7.6 7.8 10.7* 8.9  HGB 11.9* 12.2* 10.8* 11.1*  HCT 36.9* 37.7* 33.4* 35.3*  MCV 90.0 90.8 91.5 91.5  PLT 212 212 212 983    Basic Metabolic Panel: Recent Labs  Lab 08/28/21 0517 08/30/21 0624 09/01/21 0148 09/02/21 0352  NA 131* 136 133* 135  K 3.9 3.9 4.0 4.0  CL 97* 99 100 98  CO2 28 30 27 30   GLUCOSE 187* 166* 127* 120*  BUN 29* 22* 28* 29*  CREATININE 1.27* 1.12 1.43* 1.39*  CALCIUM 8.3* 8.8* 7.8* 8.6*    GFR: Estimated Creatinine Clearance: 79.4 mL/min (A) (by C-G formula based on SCr of 1.39 mg/dL (H)). Liver Function Tests: No results for input(s): AST, ALT, ALKPHOS, BILITOT, PROT, ALBUMIN in the last 168 hours.  No results for input(s): LIPASE, AMYLASE in the last 168 hours. No results for input(s): AMMONIA in the last 168 hours. Coagulation Profile: No results for input(s): INR, PROTIME in the last 168 hours. Cardiac Enzymes: No results for input(s): CKTOTAL, CKMB, CKMBINDEX, TROPONINI in the last 168 hours. BNP (last 3 results) No results for input(s): PROBNP in the last 8760 hours. HbA1C: No results for input(s): HGBA1C in the last 72 hours. CBG: Recent Labs  Lab 09/02/21 1115 09/02/21 1631 09/02/21 2106 09/03/21 0737 09/03/21 1201  GLUCAP 196* 133* 134* 181* 192*    Lipid Profile: No results for input(s): CHOL,  HDL, LDLCALC, TRIG, CHOLHDL, LDLDIRECT in the last 72 hours. Thyroid Function Tests: No results for  input(s): TSH, T4TOTAL, FREET4, T3FREE, THYROIDAB in the last 72 hours. Anemia Panel: No results for input(s): VITAMINB12, FOLATE, FERRITIN, TIBC, IRON, RETICCTPCT in the last 72 hours.  Urine analysis:    Component Value Date/Time   COLORURINE YELLOW 07/22/2021 0250   APPEARANCEUR CLOUDY (A) 07/22/2021 0250   LABSPEC 1.018 07/22/2021 0250   PHURINE 5.0 07/22/2021 0250   GLUCOSEU >=500 (A) 07/22/2021 0250   HGBUR SMALL (A) 07/22/2021 0250   BILIRUBINUR NEGATIVE 07/22/2021 0250   KETONESUR 5 (A) 07/22/2021 0250   PROTEINUR 30 (A) 07/22/2021 0250   UROBILINOGEN 0.2 09/13/2019 0906   NITRITE NEGATIVE 07/22/2021 0250   LEUKOCYTESUR TRACE (A) 07/22/2021 0250   No results found for this or any previous visit (from the past 240 hour(s)).    Radiology Studies: No results found.  Scheduled Meds:  acetaminophen  1,000 mg Oral TID   amiodarone  200 mg Oral Daily   apixaban  5 mg Oral BID   vitamin C  1,000 mg Oral Daily   cefadroxil  1,000 mg Oral BID   Chlorhexidine Gluconate Cloth  6 each Topical Daily   collagenase   Topical Daily   finasteride  5 mg Oral Daily   gabapentin  400 mg Oral TID   hydrALAZINE  25 mg Oral Q12H   insulin aspart  0-15 Units Subcutaneous TID WC   insulin aspart  0-5 Units Subcutaneous QHS   insulin aspart  6 Units Subcutaneous TID WC   insulin detemir  36 Units Subcutaneous BID   isosorbide mononitrate  15 mg Oral Daily   methocarbamol  1,000 mg Oral Q8H   metoprolol succinate  25 mg Oral Daily   multivitamin with minerals  1 tablet Oral Daily   nystatin cream   Topical BID   oxyCODONE  20 mg Oral Q12H   polyethylene glycol  17 g Oral Daily   Ensure Max Protein  11 oz Oral BID BM   silver nitrate applicators  1 application Topical Once   sodium chloride flush  10-40 mL Intracatheter Q12H   tamsulosin  0.4 mg Oral Daily   testosterone  5 g Transdermal Daily   torsemide  10 mg Oral QODAY   Continuous Infusions:  sodium chloride Stopped (08/28/21  1720)     LOS: 43 days    Oren Binet, MD [facility] [email or web address] 09/03/2021, 12:14 PM

## 2021-09-03 NOTE — Progress Notes (Signed)
PT Cancellation Note  Patient Details Name: [name] MRN: [id number] DOB: 1966/03/16   Cancelled Treatment:    Reason Eval/Treat Not Completed: Patient declined, no reason specified.  Pt declined OOB or to sit up in bed.  States he only has therapy at 2 PM.  Follow up as time allows.   Ramond Dial 09/03/2021, 11:16 AM  Mee Hives, PT PhD Acute Rehab Dept. Number: [place] and [place]

## 2021-09-03 NOTE — Care Management Important Message (Signed)
Important Message  Patient Details  Name: [name] MRN: [id number] Date of Birth: 05-18-1966   Medicare Important Message Given:  Yes     Hannah Beat 09/03/2021, 1:32 PM

## 2021-09-03 NOTE — Progress Notes (Signed)
Inpatient Rehabilitation Admissions Coordinator   I met at bedside with patient per [facility] team and MD request. I explained that he is not a CIR candidate for unable to participate in the 3 hours per day of therapy required of admit. I attempted to further explain additional criteria measures, but patient then expressed that we should "bend the rules" for he felt his sacral wound was caused by this admission. Again attempted to further explain additional criteria, but he turned off room light and stated he did not want to discuss with me further. I updated acute team and TOC team. Patient is not a CIR candidate.  Danne Baxter, RN, MSN Rehab Admissions Coordinator [phone] 09/03/2021 11:45 AM

## 2021-09-03 NOTE — Progress Notes (Signed)
Physical Therapy Wound Treatment Patient Details  Name: [name] MRN: [id number] Date of Birth: [birth year]  Today's Date: 09/03/2021 Time: 9390-3009 Time Calculation (min): 43 min  Subjective  Subjective Assessment Subjective: Pt reports shaking/chills last night Patient and Family Stated Goals: Home to family Date of Onset:  (unknown) Prior Treatments:  (unknown)  Pain Score:    Wound Assessment  Pressure Injury 07/27/21 Sacrum Mid Unstageable - Full thickness tissue loss in which the base of the injury is covered by slough (yellow, tan, gray, green or brown) and/or eschar (tan, brown or black) in the wound bed. small pink pressure ulcer with bre (Active)  Wound Image   09/01/21 1600  Dressing Type ABD;Barrier Film (skin prep);Gauze (Comment);Moist to dry;Normal saline moist dressing;Santyl 09/03/21 1109  Dressing Changed;Clean;Dry;Intact 09/03/21 1109  Dressing Change Frequency Daily 09/03/21 1109  State of Healing Eschar 09/03/21 1109  Site / Wound Assessment Red;Yellow 09/03/21 1109  % Wound base Red or Granulating 40% 09/03/21 1109  % Wound base Yellow/Fibrinous Exudate 60% 09/03/21 1109  % Wound base Black/Eschar 0% 09/03/21 1109  % Wound base Other/Granulation Tissue (Comment) 0% 09/03/21 1109  Peri-wound Assessment Pink;Intact 09/03/21 1109  Wound Length (cm) 7 cm 08/28/21 1100  Wound Width (cm) 7 cm 08/28/21 1100  Wound Depth (cm) 2 cm 08/28/21 1100  Wound Surface Area (cm^2) 49 cm^2 08/28/21 1100  Wound Volume (cm^3) 98 cm^3 08/28/21 1100  Undermining (cm) 3 cm at 3 oclock, 1.5 cm at 12 oclock 08/28/21 1100  Margins Unattached edges (unapproximated) 09/03/21 1109  Drainage Amount Moderate 09/03/21 1109  Drainage Description Green 09/03/21 1109  Treatment Cleansed;Debridement (Selective);Hydrotherapy (Pulse lavage);Packing (Saline gauze) 09/03/21 1109      Hydrotherapy Pulsed lavage therapy - wound location: sacral Pulsed Lavage with Suction (psi): 8 psi  (8012) Pulsed Lavage with Suction - Normal Saline Used: 1000 mL Pulsed Lavage Tip: Tip with splash shield Selective Debridement Selective Debridement - Location: sacral Selective Debridement - Tools Used: Forceps, Scalpel Selective Debridement - Tissue Removed: yellow adherent necrotic tissue and slough    Wound Assessment and Plan  Wound Therapy - Assess/Plan/Recommendations Wound Therapy - Clinical Statement: Blue green exudate is back.  Peri wound appears to be improved. Yellow slough remaining is very adherent.  Will continue frequency at 3x/wk, hopefully to allow pt to do more in PT/OT. Wound Therapy - Functional Problem List: Global weakness Factors Delaying/Impairing Wound Healing: Diabetes Mellitus, Incontinence, Infection - systemic/local, Immobility, Multiple medical problems Hydrotherapy Plan: Debridement, Dressing change, Patient/family education, Pulsatile lavage with suction Wound Therapy - Frequency: 6X / week Wound Therapy - Current Recommendations: PT Wound Therapy - Follow Up Recommendations: dressing changes by family/patient, dressing changes by RN  Wound Therapy Goals- Improve the function of patient's integumentary system by progressing the wound(s) through the phases of wound healing (inflammation - proliferation - remodeling) by: Wound Therapy Goals - Improve the function of patient's integumentary system by progressing the wound(s) through the phases of wound healing by: Decrease Necrotic Tissue to: 20% Decrease Necrotic Tissue - Progress: Progressing toward goal Increase Granulation Tissue to: 80% Increase Granulation Tissue - Progress: Progressing toward goal Goals/treatment plan/discharge plan were made with and agreed upon by patient/family: Yes Time For Goal Achievement: 7 days Wound Therapy - Potential for Goals: Good  Goals will be updated until maximal potential achieved or discharge criteria met.  Discharge criteria: when goals achieved, discharge from  hospital, MD decision/surgical intervention, no progress towards goals, refusal/missing three consecutive treatments without notification or medical reason.  GP     Charges PT Wound Care Charges $Wound Debridement up to 20 cm: < or equal to 20 cm $ Wound Debridement each add'l 20 sqcm: 2 $PT PLS Gun and Tip: 1 Supply $PT Hydrotherapy Visit: 1 Visit       [name] 09/03/2021, 11:14 AM 09/03/2021  [name]., PT Acute Rehabilitation Services [phone]  (pager) [phone]  (office)

## 2021-09-04 DIAGNOSIS — N179 Acute kidney failure, unspecified: Secondary | ICD-10-CM | POA: Diagnosis not present

## 2021-09-04 DIAGNOSIS — A419 Sepsis, unspecified organism: Secondary | ICD-10-CM | POA: Diagnosis not present

## 2021-09-04 DIAGNOSIS — M4644 Discitis, unspecified, thoracic region: Secondary | ICD-10-CM | POA: Diagnosis not present

## 2021-09-04 DIAGNOSIS — I4892 Unspecified atrial flutter: Secondary | ICD-10-CM | POA: Diagnosis not present

## 2021-09-04 LAB — BASIC METABOLIC PANEL
Anion gap: 7 (ref 5–15)
BUN: 21 mg/dL — ABNORMAL HIGH (ref 6–20)
CO2: 28 mmol/L (ref 22–32)
Calcium: 8.8 mg/dL — ABNORMAL LOW (ref 8.9–10.3)
Chloride: 98 mmol/L (ref 98–111)
Creatinine, Ser: 1.28 mg/dL — ABNORMAL HIGH (ref 0.61–1.24)
GFR, Estimated: >60 mL/min (ref >60)
Glucose, Bld: 123 mg/dL — ABNORMAL HIGH (ref 70–99)
Potassium: 4 mmol/L (ref 3.5–5.1)
Sodium: 133 mmol/L — ABNORMAL LOW (ref 135–145)

## 2021-09-04 LAB — CBC
HCT: 36.4 % — ABNORMAL LOW (ref 39.0–52.0)
Hemoglobin: 11.7 g/dL — ABNORMAL LOW (ref 13.0–17.0)
MCH: 29.3 pg (ref 26.0–34.0)
MCHC: 32.1 g/dL (ref 30.0–36.0)
MCV: 91.2 fL (ref 80.0–100.0)
Platelets: 224 10*3/uL (ref 150–400)
RBC: 3.99 MIL/uL — ABNORMAL LOW (ref 4.22–5.81)
RDW: 14.3 % (ref 11.5–15.5)
WBC: 7.1 10*3/uL (ref 4.0–10.5)
nRBC: 0 % (ref 0.0–0.2)

## 2021-09-04 LAB — GLUCOSE, CAPILLARY
Glucose-Capillary: 191 mg/dL — ABNORMAL HIGH (ref 70–99)
Glucose-Capillary: 205 mg/dL — ABNORMAL HIGH (ref 70–99)
Glucose-Capillary: 101 mg/dL — ABNORMAL HIGH (ref 70–99)
Glucose-Capillary: 153 mg/dL — ABNORMAL HIGH (ref 70–99)

## 2021-09-04 MED ORDER — OXYCODONE HCL ER 20 MG PO T12A
40.0000 mg | EXTENDED_RELEASE_TABLET | Freq: Two times a day (BID) | ORAL | Status: DC
  Administered 2021-09-04 – 2021-09-09 (×12): 40 mg via ORAL
  Filled 2021-09-04 (×12): qty 2

## 2021-09-04 NOTE — Progress Notes (Signed)
Physical Therapy Treatment Patient Details Name: [name] MRN: [id number] DOB: [birth year] Today's Date: 09/04/2021   History of Present Illness Pt is a 55 y.o. 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 S/p TEE, DCCV on 11/22. Chest CT 12/4 with pleural effusion; thoracentesis attempted with insufficient fluid volume. PMH includes R BKA, aflutter, DM, HTN, depression, anxiety.    PT Comments    Working on sitting tolerance EOB since pt still has not been able to tolerate sitting for more than 10-20 minutes. Attempted to put prosthetic on but unable to get sleeve on.    Recommendations for follow up therapy are one component of a multi-disciplinary discharge planning process, led by the attending physician.  Recommendations may be updated based on patient status, additional functional criteria and insurance authorization.  Follow Up Recommendations  Skilled nursing-short term rehab (<3 hours/day)     Assistance Recommended at Discharge Frequent or constant Supervision/Assistance  Equipment Recommendations  Wheelchair (measurements PT);Wheelchair cushion (measurements PT);Hospital bed;Other (comment);BSC/3in1 (hoyer lift)    Recommendations for Other Services       Precautions / Restrictions Precautions Precautions: Fall;Back Precaution Booklet Issued: No Precaution Comments: Back precautions for comfort     Mobility  Bed Mobility Overal bed mobility: Needs Assistance Bed Mobility: Supine to Sit;Sit to Supine     Supine to sit: Min assist;+2 for physical assistance;HOB elevated Sit to supine: Mod assist;+2 for physical assistance   General bed mobility comments: mod assist +2 to return to supine due to assitance needed with BLE and safety     Transfers Overall transfer level: Needs assistance Equipment used: None               General transfer comment: Scooted EOB x 1 foot with +2 max assist    Ambulation/Gait                   Stairs             Wheelchair Mobility    Modified Rankin (Stroke Patients Only)       Balance Overall balance assessment: Needs assistance Sitting-balance support: Single extremity supported;Bilateral upper extremity supported Sitting balance-Leahy Scale: Poor Sitting balance - Comments: able to perform reaching with one extremity supported, able to maintain balance without assistance with BUE support                                    Cognition Arousal/Alertness: Awake/alert Behavior During Therapy: WFL for tasks assessed/performed Overall Cognitive Status: No family/caregiver present to determine baseline cognitive functioning Area of Impairment: Attention;Safety/judgement;Awareness;Problem solving                   Current Attention Level: Selective     Safety/Judgement: Decreased awareness of deficits Awareness: Emergent Problem Solving: Requires verbal cues General Comments: declined getting into chair due to pain last attempt, tolerated 20 minutes sitting on EOB        Exercises      General Comments        Pertinent Vitals/Pain Pain Assessment: Faces Faces Pain Scale: Hurts even more Pain Location: back and wound site Pain Descriptors / Indicators: Grimacing;Guarding;Moaning Pain Intervention(s): Limited activity within patient's tolerance;Repositioned    Home Living  Prior Function            PT Goals (current goals can now be found in the care plan section) Progress towards PT goals: Not progressing toward goals - comment    Frequency    Min 2X/week      PT Plan Discharge plan needs to be updated;Frequency needs to be updated    Co-evaluation PT/OT/SLP  Co-Evaluation/Treatment: Yes Reason for Co-Treatment: For patient/therapist safety PT goals addressed during session: Mobility/safety with mobility;Balance OT goals addressed during session: Strengthening/ROM      AM-PAC PT "6 Clicks" Mobility   Outcome Measure  Help needed turning from your back to your side while in a flat bed without using bedrails?: A Little Help needed moving from lying on your back to sitting on the side of a flat bed without using bedrails?: Total Help needed moving to and from a bed to a chair (including a wheelchair)?: Total Help needed standing up from a chair using your arms (e.g., wheelchair or bedside chair)?: Total Help needed to walk in hospital room?: Total Help needed climbing 3-5 steps with a railing? : Total 6 Click Score: 8    End of Session   Activity Tolerance: Patient limited by pain Patient left: in bed;with call bell/phone within reach   PT Visit Diagnosis: Other abnormalities of gait and mobility (R26.89);Muscle weakness (generalized) (M62.81);Pain Pain - part of body:  (back and buttocks)     Time: 2481-8590 PT Time Calculation (min) (ACUTE ONLY): 30 min  Charges:  $Therapeutic Activity: 8-22 mins                     [place] Pager [phone] Office [place] 09/04/2021, 5:19 PM

## 2021-09-04 NOTE — TOC Progression Note (Signed)
Transition of Care [facility]) - Progression Note    Patient Details  Name: [name] MRN: [id number] Date of Birth: 1965/11/09  [facility] CM/SW Contact  Emeterio Reeve, [place] Phone Number: 09/04/2021, 2:13 PM  Clinical Narrative:     CSW gave pt bed offers. Pt stated he will review and have answer later for CSW.   Expected Discharge Plan: [place] Barriers to Discharge: Continued Medical Work up  Expected Discharge Plan and Services Expected Discharge Plan: [place] In-house Referral: Clinical Social Work Discharge Planning Services: CM Consult, Follow-up appt scheduled Post Acute Care Choice: [place] arrangements for the past 2 months: Apartment                                       Social Determinants of Health (SDOH) Interventions    Readmission Risk Interventions No flowsheet data found.  Emeterio Reeve, LCSW Clinical Social Worker

## 2021-09-04 NOTE — Progress Notes (Signed)
Occupational Therapy Treatment Patient Details Name: [name] MRN: [id number] DOB: 03-09-1966 Today's Date: 09/04/2021   History of present illness Pt is a 55 y.o. 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 S/p TEE, DCCV on 11/22. Chest CT 12/4 with pleural effusion; thoracentesis attempted with insufficient fluid volume. PMH includes R BKA, aflutter, DM, HTN, depression, anxiety.   OT comments  Patient received in bed and agreeable to PT/OT treatment but declined getting into chair stating that he continued to have pain at wound site even after returning to bed after last visit. Patient agreed to get to EOB and was min assist +2 to perform.  Attempted to donn prosthesis on RLE but was unable to get sleeve on at this time possible due to dry skin. Patient tolerated 20 minutes of sitting on EOB while performing static sitting and reaching tasks before returning to sidelying. Acute OT to continue to follow.    Recommendations for follow up therapy are one component of a multi-disciplinary discharge planning process, led by the attending physician.  Recommendations may be updated based on patient status, additional functional criteria and insurance authorization.    Follow Up Recommendations  Skilled nursing-short term rehab (<3 hours/day)    Assistance Recommended at Discharge Frequent or constant Supervision/Assistance  Equipment Recommendations  None recommended by OT    Recommendations for Other Services      Precautions / Restrictions Precautions Precautions: Fall;Back Precaution Booklet Issued: No Precaution Comments: Back precautions for comfort       Mobility Bed Mobility Overal bed mobility: Needs Assistance Bed Mobility: Supine to Sit;Sit to Supine      Supine to sit: Min assist;+2 for physical assistance;HOB elevated Sit to supine: Mod assist;+2 for physical assistance   General bed mobility comments: mod assist +2 to return to supine due to assitance needed with BLE and safety    Transfers Overall transfer level: Needs assistance Equipment used: None               General transfer comment: performed scooting on EOB to address scoot transfers but patient did not feel safe on air mattress     Balance Overall balance assessment: Needs assistance Sitting-balance support: Single extremity supported;Bilateral upper extremity supported Sitting balance-Leahy Scale: Poor Sitting balance - Comments: able to perform reaching with one extremity supported, able to maintain balance without assistance with BUE support                                   ADL either performed or assessed with clinical judgement   ADL                                              Extremity/Trunk Assessment              Vision       Perception     Praxis      Cognition Arousal/Alertness: Awake/alert Behavior During Therapy: WFL for tasks assessed/performed Overall Cognitive Status: No family/caregiver present to determine baseline cognitive functioning Area of Impairment: Attention;Safety/judgement;Awareness;Problem solving  Current Attention Level: Selective     Safety/Judgement: Decreased awareness of deficits Awareness: Emergent Problem Solving: Requires verbal cues General Comments: declined getting into chair due to pain last attempt, tolerated 20 minutes sitting on EOB          Exercises     Shoulder Instructions       General Comments      Pertinent Vitals/ Pain       Pain Assessment: Faces Faces Pain Scale: Hurts even more Pain Location: back and wound site Pain Descriptors / Indicators: Grimacing;Guarding;Moaning Pain Intervention(s): Limited activity  within patient's tolerance;Monitored during session;Repositioned;Patient requesting pain meds-RN notified  Home Living                                          Prior Functioning/Environment              Frequency  Min 3X/week        Progress Toward Goals  OT Goals(current goals can now be found in the care plan section)  Progress towards OT goals: Progressing toward goals  Acute Rehab OT Goals Patient Stated Goal: get be able to tolerate OOB sitting OT Goal Formulation: With patient Time For Goal Achievement: 09/05/21 Potential to Achieve Goals: Good ADL Goals Pt Will Perform Grooming: sitting;with supervision Pt Will Perform Upper Body Bathing: with supervision;sitting Pt Will Perform Lower Body Dressing: with min assist;bed level;sitting/lateral leans Pt Will Transfer to Toilet: with mod assist;bedside commode;anterior/posterior transfer;with transfer board Pt/caregiver will Perform Home Exercise Program: Increased ROM;Left upper extremity;With written HEP provided Additional ADL Goal #1: Pt will complete bed mobility with mod assist as precursor to ADLs.  Plan Discharge plan remains appropriate    Co-evaluation    PT/OT/SLP Co-Evaluation/Treatment: Yes Reason for Co-Treatment: Complexity of the patient's impairments (multi-system involvement);For patient/therapist safety;To address functional/ADL transfers   OT goals addressed during session: Strengthening/ROM      AM-PAC OT "6 Clicks" Daily Activity     Outcome Measure   Help from another person eating meals?: A Little Help from another person taking care of personal grooming?: A Little Help from another person toileting, which includes using toliet, bedpan, or urinal?: A Lot Help from another person bathing (including washing, rinsing, drying)?: A Lot Help from another person to put on and taking off regular upper body clothing?: A Little Help from another person to put on and taking off  regular lower body clothing?: A Lot 6 Click Score: 15    End of Session    OT Visit Diagnosis: Pain;Muscle weakness (generalized) (M62.81)   Activity Tolerance Patient limited by pain   Patient Left in bed;with call bell/phone within reach   Nurse Communication Mobility status;Patient requests pain meds        Time: 1694-5038 OT Time Calculation (min): 29 min  Charges: OT General Charges $OT Visit: 1 Visit OT Treatments $Therapeutic Activity: 8-22 mins  [name], OTA Acute Rehabilitation Services  Pager [phone] Office [place] 09/04/2021, 2:48 PM

## 2021-09-04 NOTE — Progress Notes (Addendum)
PROGRESS NOTE  [name]  RFF:[id number] DOB: [birth year] DOA: 07/21/2021 PCP: Patient, No Pcp Per (Inactive)   Brief Narrative: 55 year old male with history of DM-2, prior right BKA, HFpEF, hypertension, dyslipidemia presented with left diabetic foot-resulting in septic shock with group B streptococcal bacteremia.  He was initially managed in the ICU-orthopedics performed left transtibial amputation on 11/6.  Hospital course was prolonged and complicated by thoracic discitis/osteomyelitis/epidural abscess, new diagnosis of HFrEF, atrial flutter, sacral decubitus ulcer.  He was evaluated by cardiology, neurosurgery, and infectious disease.  See below for further details. (note-this MD assumed care from 12/21)  Subjective: Lying comfortably in bed-continues to complain of pain in the back.  No other complaints.  Note-rounded with bedside RN.  Objective: Vitals: Blood pressure 110/74, pulse (!) 58, temperature (!) 97.4 F (36.3 C), temperature source Oral, resp. rate 18, height 6\' 3"  (1.905 m), weight 107 kg, SpO2 96 %.   Physical exam: Gen Exam:Alert awake-not in any distress HEENT:atraumatic, normocephalic Chest: B/L clear to auscultation anteriorly CVS:S1S2 regular Abdomen:soft non tender, non distended Extremities:no edema--s/p bilateral BKA.  Stump site appears benign. Neurology: Non focal Skin: no rash   Assessment & Plan: Septic shock due to left diabetic foot infection, Streptococcus agalactiae bacteremia and thoracic discitis: Sepsis physiology has resolved-did require ICU stay during the early part of this hospitalization for pressors.  No longer on IV vancomycin-has been transitioned to oral cefadroxil on 12/27-patient is s/p left BKA  Left diabetic foot infection: S/p left BKA on 11/16-wound VAC has been removed-Staples removed on 12/16.  Recommendations from orthopedics for dry dressing.  Group B streptococcus bacteremia: No vegetation seen on TEE-repeat blood  cultures on 11/17 negative so far.  ID following-no longer on vancomycin-has been transitioned to cefadroxil 1 g twice daily for 4 weeks from 12/27.  Remains afebrile-no leukocytosis.  Acute thoracic discitis/osteomyelitis (T6-T7 )with epidural abscess: Exam unchanged-reevaluated by neurosurgery on 12/27-recommendations are to continue supportive care.   Last MRI on 12/16 had shown improvement  Full-thickness sacral decubitus ulcer: Wound care team following-continue hydrotherapy.    New onset systolic heart failure: Volume status stable.  BP stable-continue torsemide/metoprolol/hydralazine/Imdur.  Dosage of these medications were adjusted a few days ago as patient's blood pressure was soft.     Per cardiology note-not a candidate for ACE/ARB/ARNI due to renal dysfunction.  Plans are to follow with cardiology in the outpatient setting.  New onset atrial flutter-s/p TEE/DCCV on 11/12-maintaining sinus rhythm-on amiodarone/Eliquis.  Cardiology no longer following.  Will need to follow-up with cardiology in the outpatient setting.  AKI on CKD stage IIIa: AKI hemodynamically mediated-creatinine had improved.   Acute urinary retention: Foley catheter removed on 12/19-voiding spontaneously.  Continue Flomax.  Acute on chronic pain syndrome: Continues to complain of back pain-acute pain is due to sacral decubitus ulcer and ongoing discitis/osteomyelitis involving his thoracic spine.  Increase OxyContin to 40 mg twice daily-continue oxycodone/Dilaudid for breakthrough pain.  Having BMs-remains on a bowel regimen with MiraLAX.   Insomnia: Continue trazodone  DM-2 (A1c 13.8 on 11/15) with uncontrolled hyperglycemia: CBGs stable-continue Levemir 36 units twice daily, 6 units of NovoLog with meals and SSI.  Recent Labs    09/03/21 2026 09/04/21 0754 09/04/21 1107  GLUCAP 98 191* 205*       Liver nodularity: Noted incidentally on CT chest 04/09/2021-stable for outpatient follow-up with PCP.    Multiple small bilateral pulmonary nodules:  Unchanged on last CT chest 35mm and smalled definitively benign, no further evaluation recommended.   Reported renal  lesion: Per pt. Follows with Dr. Louis Meckel.    Pleural effusion: Chronic, multifactorial partially related to CHF and to hypoalbuminemia. Not sufficient for thoracentesis.   Chronic testosterone use: Continue AndroGel.  Physical deconditioning due to acute illness: Spoke with CIR coordinator today-not a CIR candidate-see their note on 12/28.  We will need to go to SNF once hydrotherapy has been completed.  Nutrition Status: Nutrition Problem: Increased nutrient needs Etiology: wound healing Signs/Symptoms: estimated needs Interventions: MVI, Premier Protein     RN Pressure Injury Documentation: Pressure Injury 07/27/21 Sacrum Mid Unstageable - Full thickness tissue loss in which the base of the injury is covered by slough (yellow, tan, gray, green or brown) and/or eschar (tan, brown or black) in the wound bed. small pink pressure ulcer with bre (Active)  07/27/21 0255  Location: Sacrum  Location Orientation: Mid  Staging: Unstageable - Full thickness tissue loss in which the base of the injury is covered by slough (yellow, tan, gray, green or brown) and/or eschar (tan, brown or black) in the wound bed.  Wound Description (Comments): small pink pressure ulcer with break in skin 08/12/21; updated to unstageable pressure injury  Present on Admission: Yes    DVT prophylaxis: Eliquis Code Status: Full Family Communication: None at bedside. Barriers to discharge: Ongoing hydrotherapy for sacral decubitus ulcer. Disposition Plan:  Status is: Inpatient  Remains inpatient appropriate because: Hydrotherapy for stage IV sacral wound.  SNF eventually when ready for discharge.  Declined by CIR.  Insurance company declined LTACH  Consultants:  ID Neurosurgery PCCM Cardiology  Procedures:  Left TTA 07/23/2021 TEE/DCCV  07/29/2021 PICC RUE 08/13/2021  Antimicrobials: Cefazolin 11/16 Ceftriaxone 11/14, 11/16-17 Clindamycin 11/15-16 Linezolid 11/15 Pen G 11/18 Pip-tazo 11/15-11/16 Vancomycin 11/14 Cefepime 12/8 - 12/12  Data Reviewed: I have personally reviewed following labs and imaging studies  CBC: Recent Labs  Lab 08/30/21 0624 09/01/21 0148 09/02/21 0352 09/04/21 0401  WBC 7.8 10.7* 8.9 7.1  HGB 12.2* 10.8* 11.1* 11.7*  HCT 37.7* 33.4* 35.3* 36.4*  MCV 90.8 91.5 91.5 91.2  PLT 212 212 202 458    Basic Metabolic Panel: Recent Labs  Lab 08/30/21 0624 09/01/21 0148 09/02/21 0352 09/04/21 0401  NA 136 133* 135 133*  K 3.9 4.0 4.0 4.0  CL 99 100 98 98  CO2 30 27 30 28   GLUCOSE 166* 127* 120* 123*  BUN 22* 28* 29* 21*  CREATININE 1.12 1.43* 1.39* 1.28*  CALCIUM 8.8* 7.8* 8.6* 8.8*    GFR: Estimated Creatinine Clearance: 86.2 mL/min (A) (by C-G formula based on SCr of 1.28 mg/dL (H)). Liver Function Tests: No results for input(s): AST, ALT, ALKPHOS, BILITOT, PROT, ALBUMIN in the last 168 hours.  No results for input(s): LIPASE, AMYLASE in the last 168 hours. No results for input(s): AMMONIA in the last 168 hours. Coagulation Profile: No results for input(s): INR, PROTIME in the last 168 hours. Cardiac Enzymes: No results for input(s): CKTOTAL, CKMB, CKMBINDEX, TROPONINI in the last 168 hours. BNP (last 3 results) No results for input(s): PROBNP in the last 8760 hours. HbA1C: No results for input(s): HGBA1C in the last 72 hours. CBG: Recent Labs  Lab 09/03/21 1201 09/03/21 1622 09/03/21 2026 09/04/21 0754 09/04/21 1107  GLUCAP 192* 147* 98 191* 205*    Lipid Profile: No results for input(s): CHOL, HDL, LDLCALC, TRIG, CHOLHDL, LDLDIRECT in the last 72 hours. Thyroid Function Tests: No results for input(s): TSH, T4TOTAL, FREET4, T3FREE, THYROIDAB in the last 72 hours. Anemia Panel: No results for input(s):  VITAMINB12, FOLATE, FERRITIN, TIBC, IRON, RETICCTPCT in  the last 72 hours.  Urine analysis:    Component Value Date/Time   COLORURINE YELLOW 07/22/2021 0250   APPEARANCEUR CLOUDY (A) 07/22/2021 0250   LABSPEC 1.018 07/22/2021 0250   PHURINE 5.0 07/22/2021 0250   GLUCOSEU >=500 (A) 07/22/2021 0250   HGBUR SMALL (A) 07/22/2021 0250   BILIRUBINUR NEGATIVE 07/22/2021 0250   KETONESUR 5 (A) 07/22/2021 0250   PROTEINUR 30 (A) 07/22/2021 0250   UROBILINOGEN 0.2 09/13/2019 0906   NITRITE NEGATIVE 07/22/2021 0250   LEUKOCYTESUR TRACE (A) 07/22/2021 0250   No results found for this or any previous visit (from the past 240 hour(s)).    Radiology Studies: No results found.  Scheduled Meds:  acetaminophen  1,000 mg Oral TID   amiodarone  200 mg Oral Daily   apixaban  5 mg Oral BID   vitamin C  1,000 mg Oral Daily   cefadroxil  1,000 mg Oral BID   Chlorhexidine Gluconate Cloth  6 each Topical Daily   collagenase   Topical Daily   finasteride  5 mg Oral Daily   gabapentin  400 mg Oral TID   hydrALAZINE  25 mg Oral Q12H   insulin aspart  0-15 Units Subcutaneous TID WC   insulin aspart  0-5 Units Subcutaneous QHS   insulin aspart  6 Units Subcutaneous TID WC   insulin detemir  36 Units Subcutaneous BID   isosorbide mononitrate  15 mg Oral Daily   methocarbamol  1,000 mg Oral Q8H   metoprolol succinate  25 mg Oral Daily   multivitamin with minerals  1 tablet Oral Daily   nystatin cream   Topical BID   oxyCODONE  40 mg Oral Q12H   polyethylene glycol  17 g Oral Daily   Ensure Max Protein  11 oz Oral BID BM   silver nitrate applicators  1 application Topical Once   sodium chloride flush  10-40 mL Intracatheter Q12H   tamsulosin  0.4 mg Oral Daily   testosterone  5 g Transdermal Daily   torsemide  10 mg Oral QODAY   Continuous Infusions:  sodium chloride Stopped (08/28/21 1720)     LOS: 44 days    Oren Binet, MD [facility] [email or web address] 09/04/2021, 2:45 PM

## 2021-09-04 NOTE — Progress Notes (Signed)
Procedure was explained to patient prior to line removal. Prior to removal, dressing is clean,dry, and intact. Site is free from redness,drainage and tenderness to palpation. Patient was instructed to hold his breath during line removal and was able to comply. Line was removed without difficulty, no resistance was noted. Catheter measures 44 cm in length. Pressure was held with Vaseline gauze and 2x2 gauze to insertion site for 6 minutes, then gauze was covered with occlusive Tegaderm dressing. Patient was instructed to remain flat in bed for 30 minutes following procedure. Patient was also instructed to leave dressing in place for 24-48 hours and not to submerge or soak the site for an additional 5 days until a scab is well formed.

## 2021-09-05 ENCOUNTER — Telehealth: Payer: Self-pay | Admitting: Infectious Diseases

## 2021-09-05 DIAGNOSIS — M4644 Discitis, unspecified, thoracic region: Secondary | ICD-10-CM

## 2021-09-05 DIAGNOSIS — A419 Sepsis, unspecified organism: Secondary | ICD-10-CM | POA: Diagnosis not present

## 2021-09-05 DIAGNOSIS — N179 Acute kidney failure, unspecified: Secondary | ICD-10-CM | POA: Diagnosis not present

## 2021-09-05 DIAGNOSIS — I4892 Unspecified atrial flutter: Secondary | ICD-10-CM | POA: Diagnosis not present

## 2021-09-05 LAB — URINALYSIS, MICROSCOPIC (REFLEX): Squamous Epithelial / HPF: NONE SEEN (ref 0–5)

## 2021-09-05 LAB — GLUCOSE, CAPILLARY
Glucose-Capillary: 132 mg/dL — ABNORMAL HIGH (ref 70–99)
Glucose-Capillary: 95 mg/dL (ref 70–99)
Glucose-Capillary: 79 mg/dL (ref 70–99)
Glucose-Capillary: 199 mg/dL — ABNORMAL HIGH (ref 70–99)

## 2021-09-05 LAB — URINALYSIS, ROUTINE W REFLEX MICROSCOPIC

## 2021-09-05 MED ORDER — METOPROLOL SUCCINATE ER 25 MG PO TB24
25.0000 mg | ORAL_TABLET | Freq: Every day | ORAL | Status: DC
  Administered 2021-09-06 – 2021-09-09 (×4): 25 mg via ORAL
  Filled 2021-09-05 (×4): qty 1

## 2021-09-05 MED ORDER — ISOSORBIDE MONONITRATE ER 30 MG PO TB24: 15.0000 mg | ORAL_TABLET | Freq: Every day | ORAL | Status: DC

## 2021-09-05 MED ORDER — HYDRALAZINE HCL 25 MG PO TABS: 25.0000 mg | ORAL_TABLET | Freq: Two times a day (BID) | ORAL | Status: DC

## 2021-09-05 MED ORDER — TORSEMIDE 10 MG PO TABS: 10.0000 mg | ORAL_TABLET | ORAL | Status: DC

## 2021-09-05 NOTE — Progress Notes (Signed)
°    [facility]   Reason for visit: Follow up on thoracic discitis  Interval History: no acute events  Physical Exam: Constitutional:  Vitals:   09/05/21 0453 09/05/21 0723  BP: (!) 142/79 (!) 92/41  Pulse: 71 77  Resp: 18 16  Temp: 98.1 F (36.7 C) 98.3 F (36.8 C)  SpO2: 98% 95%   patient appears in NAD  Impression: thoracic discitis  Plan: 1.  Continue cefadroxil  He has follow up arranged with Dr. Gale Journey on 10/07/20  I will sign off, call with questions

## 2021-09-05 NOTE — Progress Notes (Signed)
PROGRESS NOTE  [name]  EGB:[id number] DOB: August 21, 1966 DOA: 07/21/2021 PCP: Patient, No Pcp Per (Inactive)   Brief Narrative: 55 year old male with history of DM-2, prior right BKA, HFpEF, hypertension, dyslipidemia presented with left diabetic foot-resulting in septic shock with group B streptococcal bacteremia.  He was initially managed in the ICU-orthopedics performed left transtibial amputation on 11/6.  Hospital course was prolonged and complicated by thoracic discitis/osteomyelitis/epidural abscess, new diagnosis of HFrEF, atrial flutter, sacral decubitus ulcer.  He was evaluated by cardiology, neurosurgery, and infectious disease.  See below for further details. (note-this MD assumed care from 12/21)  Subjective: Still complains of back pain.  Having BM almost on a daily basis.  Note-rounded with bedside RN.  Objective: Vitals: Blood pressure (!) 92/41, pulse 77, temperature 98.3 F (36.8 C), temperature source Oral, resp. rate 16, height 6\' 3"  (1.905 m), weight 107 kg, SpO2 95 %.   Physical exam: Gen Exam:Alert awake-not in any distress HEENT:atraumatic, normocephalic Chest: B/L clear to auscultation anteriorly CVS:S1S2 regular Abdomen:soft non tender, non distended Extremities: Bilateral BKA Neurology: Non focal Skin: no rash   Assessment & Plan: Septic shock due to left diabetic foot infection, Streptococcus agalactiae bacteremia and thoracic discitis: Sepsis physiology has resolved-did require ICU stay during the early part of this hospitalization for pressors.  No longer on IV vancomycin-has been transitioned to oral cefadroxil on 12/27-patient is s/p left BKA  Left diabetic foot infection: S/p left BKA on 11/16-wound VAC has been removed-Staples removed on 12/16.  Recommendations from orthopedics for dry dressing.  Group B streptococcus bacteremia: No vegetation seen on TEE-repeat blood cultures on 11/17 negative so far.  ID following-no longer on vancomycin-has  been transitioned to cefadroxil 1 g twice daily for 4 weeks from 12/27.  Remains afebrile-no leukocytosis.  Acute thoracic discitis/osteomyelitis (T6-T7 )with epidural abscess: Continues to have acute on chronic back pain but exam has been very stable.  Reevaluated by neurosurgery and patient's request on 12/27 with recommendations to continue supportive care.   Last MRI on 12/16 had shown improvement.  Needs to follow-up with Dr. Shanon Brow Jones-neurosurgery in 2-3 weeks post discharge.   Full-thickness sacral decubitus ulcer: Wound care team following-undergoing hydrotherapy 3 times a week but per PT/wound care-does not need inpatient stay to continue with hydrotherapy.  Can transition to bedside wound care on discharge when SNF bed available.  New onset systolic heart failure (EF 35-40% by TEE on 11/22): Volume status stable.  BP continues to fluctuate (on narcotics as well)-soft this morning-we will continue metoprolol but stop Imdur/hydralazine and Demadex.  Per cardiology note-not a candidate for ACE/ARB/ARNI due to renal dysfunction.  Plans are to follow with cardiology in the outpatient setting.  New onset atrial flutter-s/p TEE/DCCV on 11/12-maintaining sinus rhythm-on amiodarone/Eliquis.  Cardiology no longer following.  Will need to follow-up with cardiology in the outpatient setting.  AKI on CKD stage IIIa: AKI hemodynamically mediated-creatinine had improved.   Acute urinary retention: Foley catheter removed on 12/19-voiding spontaneously.  Continue Flomax.  Acute on chronic pain syndrome: Continues to complain of back pain-acute pain is due to sacral decubitus ulcer and ongoing discitis/osteomyelitis involving his thoracic spine.  Narcotic regimen has been adjusted yesterday-continue OxyContin 40 mg twice daily, continue oxycodone/Dilaudid for breakthrough pain.  Patient aware that we need to minimize his IV narcotic medications in order to prepare him for potential discharge soon.  He  understands.  Having almost daily BMs with MiraLAX.  Insomnia: Continue trazodone  DM-2 (A1c 13.8 on 11/15) with uncontrolled hyperglycemia:  CBGs stable-continue Levemir 36 units twice daily, 6 units of NovoLog with meals and SSI.  Recent Labs    09/04/21 2015 09/05/21 0743 09/05/21 1118  GLUCAP 153* 132* 95       Liver nodularity: Noted incidentally on CT chest 04/09/2021-stable for outpatient follow-up with PCP.   Multiple small bilateral pulmonary nodules:  Unchanged on last CT chest 70mm and smalled definitively benign, no further evaluation recommended.   Reported renal lesion: Per pt. Follows with Dr. Louis Meckel.    Pleural effusion: Chronic, multifactorial partially related to CHF and to hypoalbuminemia. Not sufficient for thoracentesis.   Chronic testosterone use: Continue AndroGel.  Physical deconditioning due to acute illness: Spoke with CIR coordinator a few days back-not a CIR candidate-see the note on 12/28.  Insurance declined LTAC.  Plans are for SNF-social worker following-potential bed will likely become available over the weekend.    Nutrition Status: Nutrition Problem: Increased nutrient needs Etiology: wound healing Signs/Symptoms: estimated needs Interventions: MVI, Premier Protein     RN Pressure Injury Documentation: Pressure Injury 07/27/21 Sacrum Mid Unstageable - Full thickness tissue loss in which the base of the injury is covered by slough (yellow, tan, gray, green or brown) and/or eschar (tan, brown or black) in the wound bed. small pink pressure ulcer with bre (Active)  07/27/21 0255  Location: Sacrum  Location Orientation: Mid  Staging: Unstageable - Full thickness tissue loss in which the base of the injury is covered by slough (yellow, tan, gray, green or brown) and/or eschar (tan, brown or black) in the wound bed.  Wound Description (Comments): small pink pressure ulcer with break in skin 08/12/21; updated to unstageable pressure injury  Present on  Admission: Yes    DVT prophylaxis: Eliquis Code Status: Full Family Communication: None at bedside. Barriers to discharge: Ongoing hydrotherapy for sacral decubitus ulcer. Disposition Plan:  Status is: Inpatient  Remains inpatient appropriate because: Hydrotherapy for stage IV sacral wound.  SNF eventually when ready for discharge.  Declined by CIR.  Insurance company declined LTACH  Consultants:  ID Neurosurgery PCCM Cardiology  Procedures:  Left TTA 07/23/2021 TEE/DCCV 07/29/2021 PICC RUE 08/13/2021  Antimicrobials: Cefazolin 11/16 Ceftriaxone 11/14, 11/16-17 Clindamycin 11/15-16 Linezolid 11/15 Pen G 11/18 Pip-tazo 11/15-11/16 Vancomycin 11/14 Cefepime 12/8 - 12/12  Data Reviewed: I have personally reviewed following labs and imaging studies  CBC: Recent Labs  Lab 08/30/21 0624 09/01/21 0148 09/02/21 0352 09/04/21 0401  WBC 7.8 10.7* 8.9 7.1  HGB 12.2* 10.8* 11.1* 11.7*  HCT 37.7* 33.4* 35.3* 36.4*  MCV 90.8 91.5 91.5 91.2  PLT 212 212 202 742    Basic Metabolic Panel: Recent Labs  Lab 08/30/21 0624 09/01/21 0148 09/02/21 0352 09/04/21 0401  NA 136 133* 135 133*  K 3.9 4.0 4.0 4.0  CL 99 100 98 98  CO2 30 27 30 28   GLUCOSE 166* 127* 120* 123*  BUN 22* 28* 29* 21*  CREATININE 1.12 1.43* 1.39* 1.28*  CALCIUM 8.8* 7.8* 8.6* 8.8*    GFR: Estimated Creatinine Clearance: 86.2 mL/min (A) (by C-G formula based on SCr of 1.28 mg/dL (H)). Liver Function Tests: No results for input(s): AST, ALT, ALKPHOS, BILITOT, PROT, ALBUMIN in the last 168 hours.  No results for input(s): LIPASE, AMYLASE in the last 168 hours. No results for input(s): AMMONIA in the last 168 hours. Coagulation Profile: No results for input(s): INR, PROTIME in the last 168 hours. Cardiac Enzymes: No results for input(s): CKTOTAL, CKMB, CKMBINDEX, TROPONINI in the last 168 hours. BNP (last  3 results) No results for input(s): PROBNP in the last 8760 hours. HbA1C: No results for  input(s): HGBA1C in the last 72 hours. CBG: Recent Labs  Lab 09/04/21 1107 09/04/21 1611 09/04/21 2015 09/05/21 0743 09/05/21 1118  GLUCAP 205* 101* 153* 132* 95    Lipid Profile: No results for input(s): CHOL, HDL, LDLCALC, TRIG, CHOLHDL, LDLDIRECT in the last 72 hours. Thyroid Function Tests: No results for input(s): TSH, T4TOTAL, FREET4, T3FREE, THYROIDAB in the last 72 hours. Anemia Panel: No results for input(s): VITAMINB12, FOLATE, FERRITIN, TIBC, IRON, RETICCTPCT in the last 72 hours.  Urine analysis:    Component Value Date/Time   COLORURINE STRAW (A) 09/05/2021 1245   APPEARANCEUR TURBID (A) 09/05/2021 1245   LABSPEC 1.018 07/22/2021 0250   PHURINE 5.0 07/22/2021 0250   GLUCOSEU >=500 (A) 07/22/2021 0250   HGBUR SMALL (A) 07/22/2021 0250   BILIRUBINUR NEGATIVE 07/22/2021 0250   KETONESUR 5 (A) 07/22/2021 0250   PROTEINUR 30 (A) 07/22/2021 0250   UROBILINOGEN 0.2 09/13/2019 0906   NITRITE NEGATIVE 07/22/2021 0250   LEUKOCYTESUR TRACE (A) 07/22/2021 0250   No results found for this or any previous visit (from the past 240 hour(s)).    Radiology Studies: No results found.  Scheduled Meds:  acetaminophen  1,000 mg Oral TID   amiodarone  200 mg Oral Daily   apixaban  5 mg Oral BID   vitamin C  1,000 mg Oral Daily   cefadroxil  1,000 mg Oral BID   Chlorhexidine Gluconate Cloth  6 each Topical Daily   collagenase   Topical Daily   finasteride  5 mg Oral Daily   gabapentin  400 mg Oral TID   [START ON 09/06/2021] hydrALAZINE  25 mg Oral Q12H   insulin aspart  0-15 Units Subcutaneous TID WC   insulin aspart  0-5 Units Subcutaneous QHS   insulin aspart  6 Units Subcutaneous TID WC   insulin detemir  36 Units Subcutaneous BID   [START ON 09/06/2021] isosorbide mononitrate  15 mg Oral Daily   methocarbamol  1,000 mg Oral Q8H   [START ON 09/06/2021] metoprolol succinate  25 mg Oral Daily   multivitamin with minerals  1 tablet Oral Daily   nystatin cream    Topical BID   oxyCODONE  40 mg Oral Q12H   polyethylene glycol  17 g Oral Daily   Ensure Max Protein  11 oz Oral BID BM   silver nitrate applicators  1 application Topical Once   sodium chloride flush  10-40 mL Intracatheter Q12H   tamsulosin  0.4 mg Oral Daily   testosterone  5 g Transdermal Daily   [START ON 09/06/2021] torsemide  10 mg Oral QODAY   Continuous Infusions:  sodium chloride Stopped (08/28/21 1720)     LOS: 45 days    Oren Binet, MD [facility] [email or web address] 09/05/2021, 3:11 PM

## 2021-09-05 NOTE — TOC Progression Note (Addendum)
Transition of Care [facility]) - Progression Note    Patient Details  Name: [name] MRN: [id number] Date of Birth: 1966/06/07  [facility] of [facility]) CM/SW Contact  Emeterio Reeve, [place] Phone Number: 09/05/2021, 1:05 PM  Clinical Narrative:     Csw spoke to pt by phone. Pt stated he wanted to go to [place], CSW informed pt that [employer] declined. Pt stated he wanted to go to [facility], CSW informed pt that they are pending in the hub. CSW sent messege to admissions coordinator. Pt stated that if [name] says no he will go to [facility].   Pt is currently receiving hydro therapy, that will stop at DC. Pts passr is [phone].  Expected Discharge Plan: [place] Barriers to Discharge: Continued Medical Work up  Expected Discharge Plan and Services Expected Discharge Plan: [place] In-house Referral: Clinical Social Work Discharge Planning Services: CM Consult, Follow-up appt scheduled Post Acute Care Choice: [place] arrangements for the past 2 months: Apartment                                       Social Determinants of Health (SDOH) Interventions    Readmission Risk Interventions No flowsheet data found.  Emeterio Reeve, LCSW Clinical Social Worker

## 2021-09-05 NOTE — Progress Notes (Signed)
Physical Therapy Wound Treatment Patient Details  Name: [name] MRN: [id number] Date of Birth: 1965/09/27  Today's Date: 09/05/2021 Time: 1020-1046 Time Calculation (min): 26 min  Subjective  Subjective Assessment Subjective: pt reports better today Patient and Family Stated Goals: Home to family Date of Onset:  (unknown) Prior Treatments:  (unknown)  Pain Score:    Wound Assessment  Pressure Injury 07/27/21 Sacrum Mid Unstageable - Full thickness tissue loss in which the base of the injury is covered by slough (yellow, tan, gray, green or brown) and/or eschar (tan, brown or black) in the wound bed. small pink pressure ulcer with bre (Active)  Wound Image   09/01/21 1600  Dressing Type ABD;Barrier Film (skin prep);Gauze (Comment);Moist to dry;Normal saline moist dressing;Santyl 09/05/21 1228  Dressing Changed 09/05/21 1228  Dressing Change Frequency Daily 09/05/21 1228  State of Healing Eschar 09/05/21 1228  Site / Wound Assessment Red;Yellow 09/05/21 1228  % Wound base Red or Granulating 40% 09/05/21 1228  % Wound base Yellow/Fibrinous Exudate 60% 09/05/21 1228  % Wound base Black/Eschar 0% 09/05/21 1228  % Wound base Other/Granulation Tissue (Comment) 0% 09/05/21 1228  Peri-wound Assessment Intact 09/05/21 1228  Wound Length (cm) 7 cm 08/28/21 1100  Wound Width (cm) 7 cm 08/28/21 1100  Wound Depth (cm) 2 cm 08/28/21 1100  Wound Surface Area (cm^2) 49 cm^2 08/28/21 1100  Wound Volume (cm^3) 98 cm^3 08/28/21 1100  Undermining (cm) 3 cm at 3 oclock, 1.5 cm at 12 oclock 08/28/21 1100  Margins Unattached edges (unapproximated) 09/05/21 1228  Drainage Amount Moderate 09/05/21 1228  Drainage Description Serosanguineous;Other (Comment) 09/05/21 1228  Treatment Cleansed;Debridement (Selective);Hydrotherapy (Pulse lavage);Packing (Saline gauze) 09/05/21 1228      Hydrotherapy Pulsed lavage therapy - wound location: sacral Pulsed Lavage with Suction (psi): 8 psi  (8012) Pulsed Lavage with Suction - Normal Saline Used: 1000 mL Pulsed Lavage Tip: Tip with splash shield Selective Debridement Selective Debridement - Location: sacral Selective Debridement - Tools Used: Forceps, Scalpel Selective Debridement - Tissue Removed: yellow adherent necrotic tissue and slough    Wound Assessment and Plan  Wound Therapy - Assess/Plan/Recommendations Wound Therapy - Clinical Statement: Nursing has witnessed blue/green exudate Peri wound appears to be improved. Yellow slough remaining is very adherent.  Will continue frequency at 3x/wk, hopefully to allow pt to do more in PT/OT.  Also noted murky dark urine. Wound Therapy - Functional Problem List: Global weakness Factors Delaying/Impairing Wound Healing: Diabetes Mellitus, Incontinence, Infection - systemic/local, Immobility, Multiple medical problems Hydrotherapy Plan: Debridement, Dressing change, Patient/family education, Pulsatile lavage with suction Wound Therapy - Frequency: 6X / week Wound Therapy - Current Recommendations: PT Wound Therapy - Follow Up Recommendations: dressing changes by family/patient, dressing changes by RN  Wound Therapy Goals- Improve the function of patient's integumentary system by progressing the wound(s) through the phases of wound healing (inflammation - proliferation - remodeling) by: Wound Therapy Goals - Improve the function of patient's integumentary system by progressing the wound(s) through the phases of wound healing by: Decrease Necrotic Tissue to: 20% Decrease Necrotic Tissue - Progress: Progressing toward goal Increase Granulation Tissue to: 80% Increase Granulation Tissue - Progress: Progressing toward goal Goals/treatment plan/discharge plan were made with and agreed upon by patient/family: Yes Time For Goal Achievement: 7 days Wound Therapy - Potential for Goals: Good  Goals will be updated until maximal potential achieved or discharge criteria met.  Discharge  criteria: when goals achieved, discharge from hospital, MD decision/surgical intervention, no progress towards goals, refusal/missing three consecutive treatments without  notification or medical reason.  GP     Charges PT Wound Care Charges $Wound Debridement up to 20 cm: < or equal to 20 cm $ Wound Debridement each add'l 20 sqcm: 1 $PT PLS Gun and Tip: 1 Supply $PT Hydrotherapy Visit: 1 Visit       [name] 09/05/2021, 12:35 PM 09/05/2021  [name] Carne., PT Acute Rehabilitation Services [phone]  (pager) [phone]  (office)

## 2021-09-05 NOTE — Progress Notes (Signed)
OT Cancellation Note  Patient Details Name: [name] MRN: [id number] DOB: 02/23/1966   Cancelled Treatment:    Reason Eval/Treat Not Completed: Patient declined, no reason specified (Patient states he did not want to participate on this date due to fatigue and pain at wound site.) [name], [place]  Pager [phone] Office [place] 09/05/2021, 1:52 PM

## 2021-09-05 NOTE — Telephone Encounter (Signed)
Patient needs MRI T spine prior to next appt with Dr. Gale Journey on 1/31. Orders entered.  Joycelyn Schmid would you please help to schedule? He is currently admitted to the hospital awaiting SNF placement but has access to his telephone there.   Thank you.

## 2021-09-06 DIAGNOSIS — M4644 Discitis, unspecified, thoracic region: Secondary | ICD-10-CM | POA: Diagnosis not present

## 2021-09-06 LAB — URINALYSIS, ROUTINE W REFLEX MICROSCOPIC
Bilirubin Urine: NEGATIVE
Glucose, UA: NEGATIVE mg/dL
Ketones, ur: NEGATIVE mg/dL
Nitrite: POSITIVE — AB
Protein, ur: 100 mg/dL — AB
RBC / HPF: >50 RBC/hpf — ABNORMAL HIGH (ref 0–5)
Specific Gravity, Urine: 1.011 (ref 1.005–1.030)
WBC, UA: >50 WBC/hpf — ABNORMAL HIGH (ref 0–5)
pH: 7 (ref 5.0–8.0)

## 2021-09-06 LAB — GLUCOSE, CAPILLARY
Glucose-Capillary: 182 mg/dL — ABNORMAL HIGH (ref 70–99)
Glucose-Capillary: 144 mg/dL — ABNORMAL HIGH (ref 70–99)
Glucose-Capillary: 169 mg/dL — ABNORMAL HIGH (ref 70–99)
Glucose-Capillary: 151 mg/dL — ABNORMAL HIGH (ref 70–99)

## 2021-09-06 LAB — RESP PANEL BY RT-PCR (FLU A&B, COVID) ARPGX2
Influenza A by PCR: NEGATIVE
Influenza B by PCR: NEGATIVE
SARS Coronavirus 2 by RT PCR: NEGATIVE

## 2021-09-06 NOTE — Plan of Care (Signed)

## 2021-09-06 NOTE — Progress Notes (Signed)
Dressing changed as ordered. Pt tolerated well. Will continue to monitor.

## 2021-09-06 NOTE — Progress Notes (Signed)
PROGRESS NOTE    [name]  UTM:[id number]  DOB: 12/06/1965  DOA: 07/21/2021 PCP: Patient, No Pcp Per (Inactive) Outpatient Specialists:   Hospital course:  55 year old male with history of DM-2, prior right BKA, HFpEF, hypertension, dyslipidemia presented with left diabetic foot-resulting in septic shock with group B streptococcal bacteremia.  He was initially managed in the ICU-orthopedics performed left transtibial amputation on 11/6.  Hospital course was prolonged and complicated by thoracic discitis/osteomyelitis/epidural abscess, new diagnosis of HFrEF, atrial flutter, sacral decubitus ulcer.  He was evaluated by cardiology, neurosurgery, and infectious disease.     Subjective:  Patient's main concern today is what we are going to do about his urine which looks cloudy.  He is also concerned that he has inguinal adenopathy which he thinks is new.  He thinks that inguinal adenopathy is related to his urine.  We discussed that difference between asymptomatic bacteriuria and urinary tract infection at length.  Discussed the importance of avoiding MDR bacteria especially given his multiple bacterial infections and antibiotics.  I noted we would monitor him and if he were to be febrile or develop a leukocytosis would consider treating urine.   Objective: Vitals:   09/05/21 1618 09/05/21 2219 09/06/21 0521 09/06/21 0732  BP: (!) 150/82 (!) 154/78 128/68 (!) 107/57  Pulse: 70 75 76 81  Resp: 20 19 18 17   Temp: 98.1 F (36.7 C) 98.3 F (36.8 C) 98.6 F (37 C) 98.2 F (36.8 C)  TempSrc: Oral Oral Oral Oral  SpO2: 98% 99% 95% 95%  Weight:      Height:        Intake/Output Summary (Last 24 hours) at 09/06/2021 1758 Last data filed at 09/06/2021 0944 Gross per 24 hour  Intake 230 ml  Output 1500 ml  Net -1270 ml   Filed Weights   07/27/21 0252 07/29/21 0754 08/01/21 0459  Weight: 108.2 kg 108.2 kg 107 kg     Exam:  General: Patient in good spirits lying in bed  in no acute distress chatting with me and friend on the phone, watching TV. Eyes: sclera anicteric, conjuctiva mild injection bilaterally CVS: S1-S2, regular  Respiratory:  decreased air entry bilaterally secondary to decreased inspiratory effort, rales at bases  GI: NABS, soft, NT, patient does have bilateral inguinal adenopathy approximately 6 to 8 mm bilaterally that are mildly tender. LE: S/p bilateral BKA Neuro: A/O x 3, grossly nonfocal.    Assessment & Plan:   Abnormal UA Patient is afebrile, no leukocytosis and no symptomatology At this point this is still asymptomatic bacteriuria Would have low threshold for treating if he were to develop any symptoms of acute infection Can consider treatment with 1 dose of fosfomycin if warranted  Inguinal adenopathy Very possibly secondary to his discitis/OM with epidural abscess as well as his full-thickness sacral decubitus ulcer  Disposition Patient is awaiting transfer to a SNF bed, awaiting insurance authorization   Copied from note by Dr. Sloan Leiter.  We will continue therapeutic regimen already in place:   Septic shock due to left diabetic foot infection, Streptococcus agalactiae bacteremia and thoracic discitis: Sepsis physiology has resolved-did require ICU stay during the early part of this hospitalization for pressors.  No longer on IV vancomycin-has been transitioned to oral cefadroxil on 12/27-patient is s/p left BKA   Left diabetic foot infection: S/p left BKA on 11/16-wound VAC has been removed-Staples removed on 12/16.  Recommendations from orthopedics for dry dressing.   Group B streptococcus bacteremia: No vegetation seen on  TEE-repeat blood cultures on 11/17 negative so far.  ID following-no longer on vancomycin-has been transitioned to cefadroxil 1 g twice daily for 4 weeks from 12/27.  Remains afebrile-no leukocytosis.   Acute thoracic discitis/osteomyelitis (T6-T7 )with epidural abscess: Continues to have acute on  chronic back pain but exam has been very stable.  Reevaluated by neurosurgery and patient's request on 12/27 with recommendations to continue supportive care.   Last MRI on 12/16 had shown improvement.  Needs to follow-up with Dr. Shanon Brow Jones-neurosurgery in 2-3 weeks post discharge.    Full-thickness sacral decubitus ulcer: Wound care team following-undergoing hydrotherapy 3 times a week but per PT/wound care-does not need inpatient stay to continue with hydrotherapy.  Can transition to bedside wound care on discharge when SNF bed available.   New onset systolic heart failure (EF 35-40% by TEE on 11/22): Volume status stable.  BP continues to fluctuate (on narcotics as well)-soft this morning-we will continue metoprolol but stop Imdur/hydralazine and Demadex.  Per cardiology note-not a candidate for ACE/ARB/ARNI due to renal dysfunction.  Plans are to follow with cardiology in the outpatient setting.   New onset atrial flutter-s/p TEE/DCCV on 11/12-maintaining sinus rhythm-on amiodarone/Eliquis.  Cardiology no longer following.  Will need to follow-up with cardiology in the outpatient setting.   AKI on CKD stage IIIa: AKI hemodynamically mediated-creatinine had improved.    Acute urinary retention: Foley catheter removed on 12/19-voiding spontaneously.  Continue Flomax.   Acute on chronic pain syndrome: Continues to complain of back pain-acute pain is due to sacral decubitus ulcer and ongoing discitis/osteomyelitis involving his thoracic spine.  Narcotic regimen has been adjusted yesterday-continue OxyContin 40 mg twice daily, continue oxycodone/Dilaudid for breakthrough pain.  Patient aware that we need to minimize his IV narcotic medications in order to prepare him for potential discharge soon.  He understands.  Having almost daily BMs with MiraLAX.   Insomnia: Continue trazodone   DM-2 (A1c 13.8 on 11/15) with uncontrolled hyperglycemia: CBGs stable-continue Levemir 36 units twice daily, 6 units  of NovoLog with meals and SSI.    DVT prophylaxis: Eliquis Code Status: Partial code Family Communication: Spoke with patient's friend on the phone per his request Disposition Plan:   Patient is from: Home  Anticipated Discharge Location: SNF  Barriers to Discharge: Awaiting insurance authorization for SNF bed  Is patient medically stable for Discharge: Yes   Scheduled Meds:  acetaminophen  1,000 mg Oral TID   amiodarone  200 mg Oral Daily   apixaban  5 mg Oral BID   vitamin C  1,000 mg Oral Daily   cefadroxil  1,000 mg Oral BID   Chlorhexidine Gluconate Cloth  6 each Topical Daily   collagenase   Topical Daily   finasteride  5 mg Oral Daily   gabapentin  400 mg Oral TID   insulin aspart  0-15 Units Subcutaneous TID WC   insulin aspart  0-5 Units Subcutaneous QHS   insulin aspart  6 Units Subcutaneous TID WC   insulin detemir  36 Units Subcutaneous BID   methocarbamol  1,000 mg Oral Q8H   metoprolol succinate  25 mg Oral Daily   multivitamin with minerals  1 tablet Oral Daily   nystatin cream   Topical BID   oxyCODONE  40 mg Oral Q12H   polyethylene glycol  17 g Oral Daily   Ensure Max Protein  11 oz Oral BID BM   silver nitrate applicators  1 application Topical Once   sodium chloride flush  10-40  mL Intracatheter Q12H   tamsulosin  0.4 mg Oral Daily   testosterone  5 g Transdermal Daily   Continuous Infusions:  sodium chloride Stopped (08/28/21 1720)    Data Reviewed:  Basic Metabolic Panel: Recent Labs  Lab 09/01/21 0148 09/02/21 0352 09/04/21 0401  NA 133* 135 133*  K 4.0 4.0 4.0  CL 100 98 98  CO2 27 30 28   GLUCOSE 127* 120* 123*  BUN 28* 29* 21*  CREATININE 1.43* 1.39* 1.28*  CALCIUM 7.8* 8.6* 8.8*    CBC: Recent Labs  Lab 09/01/21 0148 09/02/21 0352 09/04/21 0401  WBC 10.7* 8.9 7.1  HGB 10.8* 11.1* 11.7*  HCT 33.4* 35.3* 36.4*  MCV 91.5 91.5 91.2  PLT 212 202 224    Studies: No results found.  Principal Problem:   Discitis of  thoracic region Active Problems:   Essential hypertension   Stage 3a chronic kidney disease (HCC)   Type 2 diabetes mellitus with diabetic polyneuropathy, with long-term current use of insulin (HCC)   Right below-knee amputee (HCC)   Diabetic infection of left foot (HCC)   Chronic diastolic (congestive) heart failure (HCC)   Severe sepsis with acute organ dysfunction (HCC)   Chest pain   AKI (acute kidney injury) ([place])   Gas gangrene of foot ([place])   Atrial flutter ([place])   Septic shock ([place])   Systolic dysfunction   Pressure injury of skin   Bacteremia due to group B Streptococcus     [name], [facility]  If 7PM-7AM, please contact night-coverage [email or web address]   LOS: 46 days

## 2021-09-06 NOTE — TOC Progression Note (Addendum)
Transition of Care [facility]) - Progression Note    Patient Details  Name: [name] MRN: [id number] Date of Birth: April 08, 1966  Transition of Care [facility]) CM/SW Contact  Joanne Chars, LCSW Phone Number: 09/06/2021, 10:13 AM  Clinical Narrative:   Pt ready for DC to SNF today.  No authorization information, CSW spoke with [facility], who confirmed they do manage this [facility] Medicare policy.  CSW also spoke with Juliann Pulse at [facility] who confirmed they have accepted this pt and can receive him today.  CSW attempted to initiate SNF auth in the portal, issue with today being the last day of the year--CSW getting error message.  CSW again called [employer] who said they can still authorize, need to initiate over the phone.  CSW initiated auth, clinicals were able to be uploaded in the portal.    Covid test pending.   PASSR# [id number].   1330: Auth still pending in [place].     Expected Discharge Plan: [place] Barriers to Discharge: Continued Medical Work up  Expected Discharge Plan and Services Expected Discharge Plan: [place] In-house Referral: Clinical Social Work Discharge Planning Services: CM Consult, Follow-up appt scheduled Post Acute Care Choice: [place] arrangements for the past 2 months: Apartment                                       Social Determinants of Health (SDOH) Interventions    Readmission Risk Interventions No flowsheet data found.

## 2021-09-07 DIAGNOSIS — M4644 Discitis, unspecified, thoracic region: Secondary | ICD-10-CM | POA: Diagnosis not present

## 2021-09-07 LAB — CBC
HCT: 35.4 % — ABNORMAL LOW (ref 39.0–52.0)
Hemoglobin: 11.4 g/dL — ABNORMAL LOW (ref 13.0–17.0)
MCH: 29.2 pg (ref 26.0–34.0)
MCHC: 32.2 g/dL (ref 30.0–36.0)
MCV: 90.8 fL (ref 80.0–100.0)
Platelets: 233 10*3/uL (ref 150–400)
RBC: 3.9 MIL/uL — ABNORMAL LOW (ref 4.22–5.81)
RDW: 14.2 % (ref 11.5–15.5)
WBC: 8.4 10*3/uL (ref 4.0–10.5)
nRBC: 0 % (ref 0.0–0.2)

## 2021-09-07 LAB — GLUCOSE, CAPILLARY
Glucose-Capillary: 94 mg/dL (ref 70–99)
Glucose-Capillary: 131 mg/dL — ABNORMAL HIGH (ref 70–99)
Glucose-Capillary: 121 mg/dL — ABNORMAL HIGH (ref 70–99)
Glucose-Capillary: 145 mg/dL — ABNORMAL HIGH (ref 70–99)

## 2021-09-07 LAB — BASIC METABOLIC PANEL
Anion gap: 10 (ref 5–15)
BUN: 22 mg/dL — ABNORMAL HIGH (ref 6–20)
CO2: 26 mmol/L (ref 22–32)
Calcium: 8.7 mg/dL — ABNORMAL LOW (ref 8.9–10.3)
Chloride: 98 mmol/L (ref 98–111)
Creatinine, Ser: 1.24 mg/dL (ref 0.61–1.24)
GFR, Estimated: >60 mL/min (ref >60)
Glucose, Bld: 122 mg/dL — ABNORMAL HIGH (ref 70–99)
Potassium: 4.1 mmol/L (ref 3.5–5.1)
Sodium: 134 mmol/L — ABNORMAL LOW (ref 135–145)

## 2021-09-07 NOTE — Progress Notes (Signed)
PROGRESS NOTE    [name]  KTG:[id number]  DOB: 10/18/1965  DOA: 07/21/2021 PCP: Patient, No Pcp Per (Inactive) Outpatient Specialists:   Hospital course:  56 year old male with history of DM-2, prior right BKA, HFpEF, hypertension, dyslipidemia presented with left diabetic foot-resulting in septic shock with group B streptococcal bacteremia.  He was initially managed in the ICU-orthopedics performed left transtibial amputation on 11/6.  Hospital course was prolonged and complicated by thoracic discitis/osteomyelitis/epidural abscess, new diagnosis of HFrEF, atrial flutter, sacral decubitus ulcer.  He was evaluated by cardiology, neurosurgery, and infectious disease.     Subjective:  Patient is in good spirits today, is anticipating discharge to SNF tomorrow.  He has no acute complaints today.   Objective: Vitals:   09/06/21 2154 09/07/21 0521 09/07/21 0832 09/07/21 1344  BP: (!) 150/115 (!) 148/94 (!) 149/66 133/70  Pulse: 76 76 82 63  Resp: 18 17 17 18   Temp: 98.5 F (36.9 C) 98.5 F (36.9 C) 98 F (36.7 C) 98.2 F (36.8 C)  TempSrc: Oral  Oral Oral  SpO2: 98% 100% 98% 100%  Weight:      Height:        Intake/Output Summary (Last 24 hours) at 09/07/2021 1820 Last data filed at 09/07/2021 1547 Gross per 24 hour  Intake 236 ml  Output 2750 ml  Net -2514 ml    Filed Weights   07/27/21 0252 07/29/21 0754 08/01/21 0459  Weight: 108.2 kg 108.2 kg 107 kg     Exam:  General: Patient in good spirits lying in bed in no acute distress chatting with me and friend on the phone, watching TV. Eyes: sclera anicteric, conjuctiva mild injection bilaterally CVS: S1-S2, regular  Respiratory:  decreased air entry bilaterally secondary to decreased inspiratory effort, rales at bases  GI: NABS, soft, NT, patient does have bilateral inguinal adenopathy approximately 6 to 8 mm bilaterally that are mildly tender. LE: S/p bilateral BKA Neuro: A/O x 3, grossly nonfocal.     Assessment & Plan:   Abnormal UA Patient is afebrile, no leukocytosis and no symptomatology At this point this is still asymptomatic bacteriuria Would have low threshold for treating if he were to develop any symptoms of acute infection Can consider treatment with 1 dose of fosfomycin if warranted  Inguinal adenopathy Very possibly secondary to his discitis/OM with epidural abscess as well as his full-thickness sacral decubitus ulcer  Disposition Patient is awaiting transfer to a SNF bed, awaiting insurance authorization   Copied from note by Dr. Sloan Leiter:  We will continue therapeutic regimen already in place:   Septic shock due to left diabetic foot infection, Streptococcus agalactiae bacteremia and thoracic discitis: Sepsis physiology has resolved-did require ICU stay during the early part of this hospitalization for pressors.  No longer on IV vancomycin-has been transitioned to oral cefadroxil on 12/27-patient is s/p left BKA   Left diabetic foot infection: S/p left BKA on 11/16-wound VAC has been removed-Staples removed on 12/16.  Recommendations from orthopedics for dry dressing.   Group B streptococcus bacteremia: No vegetation seen on TEE-repeat blood cultures on 11/17 negative so far.  ID following-no longer on vancomycin-has been transitioned to cefadroxil 1 g twice daily for 4 weeks from 12/27.  Remains afebrile-no leukocytosis.   Acute thoracic discitis/osteomyelitis (T6-T7 )with epidural abscess: Continues to have acute on chronic back pain but exam has been very stable.  Reevaluated by neurosurgery and patient's request on 12/27 with recommendations to continue supportive care.   Last MRI on 12/16  had shown improvement.  Needs to follow-up with Dr. Shanon Brow Jones-neurosurgery in 2-3 weeks post discharge.    Full-thickness sacral decubitus ulcer: Wound care team following-undergoing hydrotherapy 3 times a week but per PT/wound care-does not need inpatient stay to continue  with hydrotherapy.  Can transition to bedside wound care on discharge when SNF bed available.   New onset systolic heart failure (EF 35-40% by TEE on 11/22): Volume status stable.  BP continues to fluctuate (on narcotics as well)-soft this morning-we will continue metoprolol but stop Imdur/hydralazine and Demadex.  Per cardiology note-not a candidate for ACE/ARB/ARNI due to renal dysfunction.  Plans are to follow with cardiology in the outpatient setting.   New onset atrial flutter-s/p TEE/DCCV on 11/12-maintaining sinus rhythm-on amiodarone/Eliquis.  Cardiology no longer following.  Will need to follow-up with cardiology in the outpatient setting.   AKI on CKD stage IIIa: AKI hemodynamically mediated-creatinine had improved.    Acute urinary retention: Foley catheter removed on 12/19-voiding spontaneously.  Continue Flomax.   Acute on chronic pain syndrome: Continues to complain of back pain-acute pain is due to sacral decubitus ulcer and ongoing discitis/osteomyelitis involving his thoracic spine.  Narcotic regimen has been adjusted yesterday-continue OxyContin 40 mg twice daily, continue oxycodone/Dilaudid for breakthrough pain.  Patient aware that we need to minimize his IV narcotic medications in order to prepare him for potential discharge soon.  He understands.  Having almost daily BMs with MiraLAX.   Insomnia: Continue trazodone   DM-2 (A1c 13.8 on 11/15) with uncontrolled hyperglycemia: CBGs stable-continue Levemir 36 units twice daily, 6 units of NovoLog with meals and SSI.    DVT prophylaxis: Eliquis Code Status: Partial code Family Communication: Spoke with patient's friend on the phone per his request Disposition Plan:   Patient is from: Home  Anticipated Discharge Location: SNF  Barriers to Discharge: Awaiting insurance authorization for SNF bed  Is patient medically stable for Discharge: Yes   Scheduled Meds:  acetaminophen  1,000 mg Oral TID   amiodarone  200 mg Oral  Daily   apixaban  5 mg Oral BID   vitamin C  1,000 mg Oral Daily   cefadroxil  1,000 mg Oral BID   Chlorhexidine Gluconate Cloth  6 each Topical Daily   collagenase   Topical Daily   finasteride  5 mg Oral Daily   gabapentin  400 mg Oral TID   insulin aspart  0-15 Units Subcutaneous TID WC   insulin aspart  0-5 Units Subcutaneous QHS   insulin aspart  6 Units Subcutaneous TID WC   insulin detemir  36 Units Subcutaneous BID   methocarbamol  1,000 mg Oral Q8H   metoprolol succinate  25 mg Oral Daily   multivitamin with minerals  1 tablet Oral Daily   nystatin cream   Topical BID   oxyCODONE  40 mg Oral Q12H   polyethylene glycol  17 g Oral Daily   Ensure Max Protein  11 oz Oral BID BM   silver nitrate applicators  1 application Topical Once   sodium chloride flush  10-40 mL Intracatheter Q12H   tamsulosin  0.4 mg Oral Daily   testosterone  5 g Transdermal Daily   Continuous Infusions:  sodium chloride Stopped (08/28/21 1720)    Data Reviewed:  Basic Metabolic Panel: Recent Labs  Lab 09/01/21 0148 09/02/21 0352 09/04/21 0401 09/07/21 0424  NA 133* 135 133* 134*  K 4.0 4.0 4.0 4.1  CL 100 98 98 98  CO2 27 30 28  26  GLUCOSE 127* 120* 123* 122*  BUN 28* 29* 21* 22*  CREATININE 1.43* 1.39* 1.28* 1.24  CALCIUM 7.8* 8.6* 8.8* 8.7*     CBC: Recent Labs  Lab 09/01/21 0148 09/02/21 0352 09/04/21 0401 09/07/21 0424  WBC 10.7* 8.9 7.1 8.4  HGB 10.8* 11.1* 11.7* 11.4*  HCT 33.4* 35.3* 36.4* 35.4*  MCV 91.5 91.5 91.2 90.8  PLT 212 202 224 233     Studies: No results found.  Principal Problem:   Discitis of thoracic region Active Problems:   Essential hypertension   Stage 3a chronic kidney disease (HCC)   Type 2 diabetes mellitus with diabetic polyneuropathy, with long-term current use of insulin (HCC)   Right below-knee amputee (HCC)   Diabetic infection of left foot (HCC)   Chronic diastolic (congestive) heart failure (HCC)   Severe sepsis with acute organ  dysfunction (HCC)   Chest pain   AKI (acute kidney injury) ([place])   Gas gangrene of foot ([place])   Atrial flutter ([place])   Septic shock ([place])   Systolic dysfunction   Pressure injury of skin   Bacteremia due to group B Streptococcus     [name] [name], [facility]  If 7PM-7AM, please contact night-coverage [email or web address]   LOS: 47 days

## 2021-09-07 NOTE — Plan of Care (Signed)
°  Problem: Clinical Measurements: °Goal: Ability to maintain clinical measurements within normal limits will improve °Outcome: Progressing °Goal: Will remain free from infection °Outcome: Progressing °Goal: Diagnostic test results will improve °Outcome: Progressing °Goal: Respiratory complications will improve °Outcome: Progressing °  °

## 2021-09-07 NOTE — TOC Progression Note (Addendum)
Transition of Care [facility]) - Progression Note    Patient Details  Name: [name] MRN: [id number] Date of Birth: Jul 15, 1966  Transition of [facility]) CM/SW Contact  Joanne Chars, LCSW Phone Number: 09/07/2021, 8:36 AM  Clinical Narrative:   Insurance Josem Kaufmann continues to be pending in [place].  CSW attempted to call [employer], office closed for New Years Day holiday.  Covid test is back and negative.    1130: auth still pending in [employer].     Expected Discharge Plan: [place] Barriers to Discharge: Continued Medical Work up  Expected Discharge Plan and Services Expected Discharge Plan: [place] In-house Referral: Clinical Social Work Discharge Planning Services: CM Consult, Follow-up appt scheduled Post Acute Care Choice: [place] arrangements for the past 2 months: Apartment                                       Social Determinants of Health (SDOH) Interventions    Readmission Risk Interventions No flowsheet data found.

## 2021-09-08 DIAGNOSIS — M4644 Discitis, unspecified, thoracic region: Secondary | ICD-10-CM | POA: Diagnosis not present

## 2021-09-08 LAB — GLUCOSE, CAPILLARY
Glucose-Capillary: 158 mg/dL — ABNORMAL HIGH (ref 70–99)
Glucose-Capillary: 181 mg/dL — ABNORMAL HIGH (ref 70–99)
Glucose-Capillary: 105 mg/dL — ABNORMAL HIGH (ref 70–99)
Glucose-Capillary: 115 mg/dL — ABNORMAL HIGH (ref 70–99)

## 2021-09-08 MED ORDER — DAKINS (1/4 STRENGTH) 0.125 % EX SOLN
Freq: Every day | CUTANEOUS | Status: DC
  Filled 2021-09-08 (×2): qty 473

## 2021-09-08 NOTE — Progress Notes (Signed)
Physical Therapy Wound Treatment Patient Details  Name: [name] MRN: [id number] Date of Birth: 08-06-1966  Today's Date: 09/08/2021 Time: 1024-1052 Time Calculation (min): 28 min  Subjective  Subjective Assessment Subjective: pt reports better today Patient and Family Stated Goals: Home to family Date of Onset:  (unknown) Prior Treatments:  (unknown)  Pain Score:  2-4/10 pain with premedication.  Wound Assessment  Pressure Injury 07/27/21 Sacrum Mid Unstageable - Full thickness tissue loss in which the base of the injury is covered by slough (yellow, tan, gray, green or brown) and/or eschar (tan, brown or black) in the wound bed. small pink pressure ulcer with bre (Active)  Wound Image   09/01/21 1600  Dressing Type ABD;Barrier Film (skin prep);Foam - Lift dressing to assess site every shift;Gauze (Comment);Moist to moist;Normal saline moist dressing;Santyl 09/08/21 1228  Dressing Changed;Clean;Dry;Intact 09/08/21 1228  Dressing Change Frequency Daily 09/08/21 1228  State of Healing Eschar 09/08/21 1228  Site / Wound Assessment Red;Yellow 09/08/21 1228  % Wound base Red or Granulating 40% 09/08/21 1228  % Wound base Yellow/Fibrinous Exudate 60% 09/08/21 1228  % Wound base Black/Eschar 0% 09/08/21 1228  % Wound base Other/Granulation Tissue (Comment) 0% 09/08/21 1228  Peri-wound Assessment Intact 09/08/21 1228  Wound Length (cm) 7 cm 08/28/21 1100  Wound Width (cm) 7 cm 08/28/21 1100  Wound Depth (cm) 2 cm 08/28/21 1100  Wound Surface Area (cm^2) 49 cm^2 08/28/21 1100  Wound Volume (cm^3) 98 cm^3 08/28/21 1100  Undermining (cm) 3 cm at 3 oclock, 1.5 cm at 12 oclock 08/28/21 1100  Margins Unattached edges (unapproximated) 09/08/21 1228  Drainage Amount Moderate 09/08/21 1228  Drainage Description Green 09/08/21 1228  Treatment Cleansed;Debridement (Selective);Hydrotherapy (Pulse lavage);Packing (Saline gauze) 09/08/21 1228      Hydrotherapy Pulsed lavage therapy - wound  location: sacral Pulsed Lavage with Suction (psi): 8 psi (8012) Pulsed Lavage with Suction - Normal Saline Used: 1000 mL Pulsed Lavage Tip: Tip with splash shield Selective Debridement Selective Debridement - Location: sacral Selective Debridement - Tools Used: Forceps, Scalpel Selective Debridement - Tissue Removed: yellow adherent necrotic tissue and slough    Wound Assessment and Plan  Wound Therapy - Assess/Plan/Recommendations Wound Therapy - Clinical Statement: Blue-green exudate has returned in "full force"  Have discuss sending pt out with orders for DAKIN.  Will continue frequency at 3x/wk, hopefully to allow pt to do more in PT/OT.  Murky dark urine continues. Wound Therapy - Functional Problem List: Global weakness Factors Delaying/Impairing Wound Healing: Diabetes Mellitus, Incontinence, Infection - systemic/local, Immobility, Multiple medical problems Hydrotherapy Plan: Debridement, Dressing change, Patient/family education, Pulsatile lavage with suction Wound Therapy - Frequency: 6X / week Wound Therapy - Current Recommendations: PT Wound Therapy - Follow Up Recommendations: dressing changes by family/patient, dressing changes by RN  Wound Therapy Goals- Improve the function of patient's integumentary system by progressing the wound(s) through the phases of wound healing (inflammation - proliferation - remodeling) by: Wound Therapy Goals - Improve the function of patient's integumentary system by progressing the wound(s) through the phases of wound healing by: Decrease Necrotic Tissue to: 20% Decrease Necrotic Tissue - Progress: Progressing toward goal Increase Granulation Tissue to: 80% Increase Granulation Tissue - Progress: Progressing toward goal Goals/treatment plan/discharge plan were made with and agreed upon by patient/family: Yes Time For Goal Achievement: 7 days Wound Therapy - Potential for Goals: Good  Goals will be updated until maximal potential achieved or  discharge criteria met.  Discharge criteria: when goals achieved, discharge from hospital, MD decision/surgical intervention,  no progress towards goals, refusal/missing three consecutive treatments without notification or medical reason.  GP     Charges PT Wound Care Charges $Wound Debridement up to 20 cm: < or equal to 20 cm $ Wound Debridement each add'l 20 sqcm: 1 $PT PLS Gun and Tip: 1 Supply $PT Hydrotherapy Visit: 1 Visit       [name] [name] 09/08/2021, 12:33 PM 09/08/2021  [name]., PT Acute Rehabilitation Services [phone]  (pager) [phone]  (office)

## 2021-09-08 NOTE — Consult Note (Signed)
[place] Nurse wound follow up Wound type: Unstageable Pressure injury Measurement: see PT notes from hydrotherapy treatments (09/05/21 7cm x 7cm x 2cm with undermining 3cm at 3 o'clock, 1.5cm at 12 o'clock   Wound bed:40% pink/60% fibrinous  Drainage (amount, consistency, odor) moderate Periwound: intact  Dressing procedure/placement/frequency: Continue hydrotherapy M/W/F Stop Santyl for now Restart 1/4% Dakin's solution to address drainage issues.   [place] Nurse team will follow with you and see patient within 10 days for wound assessments.  Please notify [name] nurses of any acute changes in the wounds or any new areas of concern [place] MSN, [employer], [place], [place]

## 2021-09-08 NOTE — Progress Notes (Signed)
PROGRESS NOTE    [name]  ZDG:[id number]  DOB: 01-30-1966  DOA: 07/21/2021 PCP: Patient, No Pcp Per (Inactive) Outpatient Specialists:   Hospital course:  56 year old male with history of DM-2, prior right BKA, HFpEF, hypertension, dyslipidemia presented with left diabetic foot-resulting in septic shock with group B streptococcal bacteremia.  He was initially managed in the ICU-orthopedics performed left transtibial amputation on 11/6.  Hospital course was prolonged and complicated by thoracic discitis/osteomyelitis/epidural abscess, new diagnosis of HFrEF, atrial flutter, sacral decubitus ulcer.  He was evaluated by cardiology, neurosurgery, and infectious disease.     Subjective:  Patient is very frustrated today.  He is angry that physical therapy tried to get him to get on a commode when he had the urge to defecate.  He feels that he should be taking baby steps and he feels like he should be getting better support to get better.  Notes that none of this is his fault because he is not getting the gel pads that he needs to get up and sit.   Objective: Vitals:   09/07/21 2017 09/08/21 0450 09/08/21 0754 09/08/21 1500  BP: (!) 141/74 (!) 148/65 (!) 150/80 139/66  Pulse: 63 (!) 57 66 64  Resp: 16 18 18 19   Temp: 98.7 F (37.1 C) 98.1 F (36.7 C) 98.1 F (36.7 C) 98 F (36.7 C)  TempSrc: Oral  Oral Oral  SpO2: 97% 97% 96% 96%  Weight:      Height:        Intake/Output Summary (Last 24 hours) at 09/08/2021 1551 Last data filed at 09/08/2021 1151 Gross per 24 hour  Intake 600 ml  Output 1850 ml  Net -1250 ml    Filed Weights   07/27/21 0252 07/29/21 0754 08/01/21 0459  Weight: 108.2 kg 108.2 kg 107 kg     Exam:  General: Patient lying in bed on his right side in no respiratory distress  eyes: sclera anicteric, conjuctiva mild injection bilaterally CVS: S1-S2, regular  Respiratory:  decreased air entry bilaterally secondary to decreased inspiratory effort,  rales at bases  GI: NABS, soft, NT, patient does have bilateral inguinal adenopathy approximately 6 to 8 mm bilaterally that are mildly tender. LE: S/p bilateral BKA Neuro: A/O x 3, grossly nonfocal.    Assessment & Plan:   Disposition Patient is awaiting transfer to a SNF bed, awaiting insurance authorization  Abnormal UA Patient is afebrile, no leukocytosis and no symptomatology At this point this is still asymptomatic bacteriuria Would have low threshold for treating if he were to develop any symptoms of acute infection Can consider treatment with 1 dose of fosfomycin if warranted  Inguinal adenopathy Very possibly secondary to his discitis/OM with epidural abscess as well as his full-thickness sacral decubitus ulcer  Full-thickness sacral decubitus Discussed with patient the need to mobilize and get pressure off his sacrum Patient feels that he is not getting adequate support and that he needs gel pads and other modalities that he is not getting as much as he would like. Wound care team following-undergoing hydrotherapy 3 times a week but per PT/wound care-does not need inpatient stay to continue with hydrotherapy.   Can transition to bedside wound care on discharge when SNF bed available.  Acute thoracic discitis/osteomyelitis (T6-T7 )with epidural abscess:  Continues to have acute on chronic back pain but exam has been very stable.  Reevaluated by neurosurgery and patient's request on 12/27 with recommendations to continue supportive care.   Last MRI on 12/16 had  shown improvement.  Needs to follow-up with Dr. Shanon Brow Jones-neurosurgery in 2-3 weeks post discharge.   New onset atrial flutter s/p TEE/DCCV on 11/12-maintaining sinus rhythm-on amiodarone/Eliquis.   Cardiology no longer following.   Will need to follow-up with cardiology in the outpatient setting.     Copied from note by Dr. Sloan Leiter:  We will continue therapeutic regimen already in place:  Septic shock due to  left diabetic foot infection, Streptococcus agalactiae bacteremia and thoracic discitis: Sepsis physiology has resolved-did require ICU stay during the early part of this hospitalization for pressors.  No longer on IV vancomycin-has been transitioned to oral cefadroxil on 12/27-patient is s/p left BKA   Left diabetic foot infection: S/p left BKA on 11/16-wound VAC has been removed-Staples removed on 12/16.  Recommendations from orthopedics for dry dressing.   Group B streptococcus bacteremia: No vegetation seen on TEE-repeat blood cultures on 11/17 negative so far.  ID following-no longer on vancomycin-has been transitioned to cefadroxil 1 g twice daily for 4 weeks from 12/27.  Remains afebrile-no leukocytosis.     New onset systolic heart failure (EF 35-40% by TEE on 11/22): Volume status stable.  BP continues to fluctuate (on narcotics as well)-soft this morning-we will continue metoprolol but stop Imdur/hydralazine and Demadex.  Per cardiology note-not a candidate for ACE/ARB/ARNI due to renal dysfunction.  Plans are to follow with cardiology in the outpatient setting.  AKI on CKD stage IIIa: AKI hemodynamically mediated-creatinine had improved.    Acute urinary retention: Foley catheter removed on 12/19-voiding spontaneously.  Continue Flomax.   Acute on chronic pain syndrome: Continues to complain of back pain-acute pain is due to sacral decubitus ulcer and ongoing discitis/osteomyelitis involving his thoracic spine.  Narcotic regimen has been adjusted yesterday-continue OxyContin 40 mg twice daily, continue oxycodone/Dilaudid for breakthrough pain.  Patient aware that we need to minimize his IV narcotic medications in order to prepare him for potential discharge soon.  He understands.  Having almost daily BMs with MiraLAX.   Insomnia: Continue trazodone   DM-2 (A1c 13.8 on 11/15) with uncontrolled hyperglycemia: CBGs stable-continue Levemir 36 units twice daily, 6 units of NovoLog with meals and  SSI.    DVT prophylaxis: Eliquis Code Status: Partial code Family Communication: Spoke with patient's friend on the phone per his request Disposition Plan:   Patient is from: Home  Anticipated Discharge Location: SNF  Barriers to Discharge: Awaiting insurance authorization for SNF bed  Is patient medically stable for Discharge: Yes   Scheduled Meds:  acetaminophen  1,000 mg Oral TID   amiodarone  200 mg Oral Daily   apixaban  5 mg Oral BID   vitamin C  1,000 mg Oral Daily   cefadroxil  1,000 mg Oral BID   Chlorhexidine Gluconate Cloth  6 each Topical Daily   collagenase   Topical Daily   finasteride  5 mg Oral Daily   gabapentin  400 mg Oral TID   insulin aspart  0-15 Units Subcutaneous TID WC   insulin aspart  0-5 Units Subcutaneous QHS   insulin aspart  6 Units Subcutaneous TID WC   insulin detemir  36 Units Subcutaneous BID   methocarbamol  1,000 mg Oral Q8H   metoprolol succinate  25 mg Oral Daily   multivitamin with minerals  1 tablet Oral Daily   nystatin cream   Topical BID   oxyCODONE  40 mg Oral Q12H   polyethylene glycol  17 g Oral Daily   Ensure Max Protein  11 oz  Oral BID BM   silver nitrate applicators  1 application Topical Once   sodium chloride flush  10-40 mL Intracatheter Q12H   sodium hypochlorite   Irrigation Daily   tamsulosin  0.4 mg Oral Daily   testosterone  5 g Transdermal Daily   Continuous Infusions:  sodium chloride Stopped (08/28/21 1720)    Data Reviewed:  Basic Metabolic Panel: Recent Labs  Lab 09/02/21 0352 09/04/21 0401 09/07/21 0424  NA 135 133* 134*  K 4.0 4.0 4.1  CL 98 98 98  CO2 30 28 26   GLUCOSE 120* 123* 122*  BUN 29* 21* 22*  CREATININE 1.39* 1.28* 1.24  CALCIUM 8.6* 8.8* 8.7*     CBC: Recent Labs  Lab 09/02/21 0352 09/04/21 0401 09/07/21 0424  WBC 8.9 7.1 8.4  HGB 11.1* 11.7* 11.4*  HCT 35.3* 36.4* 35.4*  MCV 91.5 91.2 90.8  PLT 202 224 233     Studies: No results found.  Principal Problem:    Discitis of thoracic region Active Problems:   Essential hypertension   Stage 3a chronic kidney disease (HCC)   Type 2 diabetes mellitus with diabetic polyneuropathy, with long-term current use of insulin (HCC)   Right below-knee amputee (HCC)   Diabetic infection of left foot (HCC)   Chronic diastolic (congestive) heart failure (HCC)   Severe sepsis with acute organ dysfunction (HCC)   Chest pain   AKI (acute kidney injury) ([place])   Gas gangrene of foot ([place])   Atrial flutter ([place])   Septic shock ([place])   Systolic dysfunction   Pressure injury of skin   Bacteremia due to group B Streptococcus     Airica Schwartzkopf Tubl[place] Christalynn [place], [facility]  If 7PM-7AM, please contact night-coverage [email or web address]   LOS: 48 days

## 2021-09-08 NOTE — TOC Progression Note (Signed)
Transition of Care [facility]) - Progression Note    Patient Details  Name: [name] MRN: [id number] Date of Birth: May 25, 1966  Transition of Care [facility]) CM/SW Contact  Emeterio Reeve, [place] Phone Number: 09/08/2021, 1:55 PM  Clinical Narrative:     CSW spoke to pt on the phone. CSW informed pt that insurance Josem Kaufmann is still pending and likely that it will not be received today. CSW also attempted to encourage pt to participate in physical therapy. Pt stated he never refused and "PT needs to get their story straight". Pt explained his perspective of what happened with PT.   Expected Discharge Plan: [place] Barriers to Discharge: Continued Medical Work up  Expected Discharge Plan and Services Expected Discharge Plan: [place] In-house Referral: Clinical Social Work Discharge Planning Services: CM Consult, Follow-up appt scheduled Post Acute Care Choice: [place] arrangements for the past 2 months: Apartment                                       Social Determinants of Health (SDOH) Interventions    Readmission Risk Interventions No flowsheet data found.  Emeterio Reeve, LCSW Clinical Social Worker

## 2021-09-08 NOTE — Progress Notes (Signed)
PT Cancellation Note  Patient Details Name: [name] MRN: [id number] DOB: [birth year]   Cancelled Treatment:    Reason Eval/Treat Not Completed: Other (comment) (Pt states he will do his wound care today, but he is not working with PT today.  Encouraged pt that he needed to sit EOB with PT and even offered to get him on a 3N1 when he reported he needs BM,  pt adamantly refused to do anything but the bed pan.)   Alvira Philips 09/08/2021, 9:45 AM Neils [place] M,PT Acute Rehab Services [phone] (pager)

## 2021-09-08 NOTE — Progress Notes (Signed)
OT Cancellation Note  Patient Details Name: [name] MRN: [id number] DOB: 13-Jan-1966   Cancelled Treatment:    Reason Eval/Treat Not Completed: Patient declined, no reason specified. Pt declined, reports just had wound care and he is in pain; also with plans to leave today and doesn't not want to participate in OT today.   Jolaine Artist, OT Acute Rehabilitation Services Pager [phone] Office [phone]   Delight Stare 09/08/2021, 12:33 PM

## 2021-09-09 DIAGNOSIS — M4644 Discitis, unspecified, thoracic region: Secondary | ICD-10-CM | POA: Diagnosis not present

## 2021-09-09 LAB — RESP PANEL BY RT-PCR (FLU A&B, COVID) ARPGX2
Influenza A by PCR: NEGATIVE
Influenza B by PCR: NEGATIVE
SARS Coronavirus 2 by RT PCR: NEGATIVE

## 2021-09-09 LAB — GLUCOSE, CAPILLARY
Glucose-Capillary: 72 mg/dL (ref 70–99)
Glucose-Capillary: 195 mg/dL — ABNORMAL HIGH (ref 70–99)
Glucose-Capillary: 181 mg/dL — ABNORMAL HIGH (ref 70–99)
Glucose-Capillary: 113 mg/dL — ABNORMAL HIGH (ref 70–99)

## 2021-09-09 MED ORDER — HYDROMORPHONE HCL 2 MG PO TABS: 2.0000 mg | ORAL_TABLET | Freq: Two times a day (BID) | ORAL | 0 refills | Status: DC | PRN

## 2021-09-09 MED ORDER — ZINC OXIDE 40 % EX OINT: TOPICAL_OINTMENT | Freq: Every day | CUTANEOUS | 0 refills | Status: DC | PRN

## 2021-09-09 MED ORDER — AMIODARONE HCL 200 MG PO TABS: 200.0000 mg | ORAL_TABLET | Freq: Every day | ORAL | 0 refills | Status: DC

## 2021-09-09 MED ORDER — POLYETHYLENE GLYCOL 3350 17 G PO PACK: 17.0000 g | PACK | Freq: Every day | ORAL | Status: DC | PRN

## 2021-09-09 MED ORDER — CEFADROXIL 500 MG PO CAPS: 1000.0000 mg | ORAL_CAPSULE | Freq: Two times a day (BID) | ORAL | 0 refills | Status: DC

## 2021-09-09 MED ORDER — HYDROMORPHONE HCL 2 MG PO TABS: 2.0000 mg | ORAL_TABLET | ORAL | 0 refills | Status: AC | PRN

## 2021-09-09 MED ORDER — DAKINS (1/4 STRENGTH) 0.125 % EX SOLN: Freq: Every day | CUTANEOUS | 0 refills | Status: DC

## 2021-09-09 MED ORDER — FINASTERIDE 5 MG PO TABS: 5.0000 mg | ORAL_TABLET | Freq: Every day | ORAL | 0 refills | Status: DC

## 2021-09-09 MED ORDER — INSULIN ASPART 100 UNIT/ML IJ SOLN: 6.0000 [IU] | Freq: Three times a day (TID) | INTRAMUSCULAR | 0 refills | Status: DC

## 2021-09-09 MED ORDER — METOPROLOL SUCCINATE ER 25 MG PO TB24: 25.0000 mg | ORAL_TABLET | Freq: Every day | ORAL | 0 refills | Status: DC

## 2021-09-09 MED ORDER — INSULIN DETEMIR 100 UNIT/ML ~~LOC~~ SOLN: 36.0000 [IU] | Freq: Two times a day (BID) | SUBCUTANEOUS | 0 refills | Status: DC

## 2021-09-09 MED ORDER — ENSURE MAX PROTEIN PO LIQD: 11.0000 [oz_av] | Freq: Two times a day (BID) | ORAL | 0 refills | Status: DC

## 2021-09-09 MED ORDER — CHLORHEXIDINE GLUCONATE CLOTH 2 % EX PADS: 6.0000 | MEDICATED_PAD | Freq: Every day | CUTANEOUS | 0 refills | Status: DC

## 2021-09-09 MED ORDER — APIXABAN 5 MG PO TABS: 5.0000 mg | ORAL_TABLET | Freq: Two times a day (BID) | ORAL | 0 refills | Status: DC

## 2021-09-09 MED ORDER — ADULT MULTIVITAMIN W/MINERALS CH: 1.0000 | ORAL_TABLET | Freq: Every day | ORAL | 0 refills | Status: AC

## 2021-09-09 MED ORDER — TESTOSTERONE 50 MG/5GM (1%) TD GEL: 5.0000 g | Freq: Every day | TRANSDERMAL | 0 refills | Status: DC

## 2021-09-09 MED ORDER — OXYCODONE HCL ER 40 MG PO T12A: 40.0000 mg | EXTENDED_RELEASE_TABLET | Freq: Two times a day (BID) | ORAL | 0 refills | Status: AC

## 2021-09-09 MED ORDER — SENNOSIDES-DOCUSATE SODIUM 8.6-50 MG PO TABS
2.0000 | ORAL_TABLET | Freq: Every day | ORAL | Status: DC
  Administered 2021-09-09: 2 via ORAL
  Filled 2021-09-09: qty 2

## 2021-09-09 MED ORDER — SILVER NITRATE-POT NITRATE 75-25 % EX MISC: 1.0000 "application " | Freq: Once | CUTANEOUS | 0 refills | Status: AC

## 2021-09-09 MED ORDER — COLLAGENASE 250 UNIT/GM EX OINT: TOPICAL_OINTMENT | Freq: Every day | CUTANEOUS | 0 refills | Status: DC

## 2021-09-09 MED ORDER — METHOCARBAMOL 1000 MG PO TABS: 1000.0000 mg | ORAL_TABLET | Freq: Three times a day (TID) | ORAL | 0 refills | Status: DC

## 2021-09-09 MED ORDER — TRAZODONE HCL 150 MG PO TABS: 150.0000 mg | ORAL_TABLET | Freq: Every evening | ORAL | 0 refills | Status: DC | PRN

## 2021-09-09 MED ORDER — GABAPENTIN 400 MG PO CAPS: 400.0000 mg | ORAL_CAPSULE | Freq: Three times a day (TID) | ORAL | 0 refills | Status: DC

## 2021-09-09 MED ORDER — NYSTATIN 100000 UNIT/GM EX CREA: TOPICAL_CREAM | Freq: Two times a day (BID) | CUTANEOUS | 0 refills | Status: DC

## 2021-09-09 MED ORDER — TAMSULOSIN HCL 0.4 MG PO CAPS: 0.4000 mg | ORAL_CAPSULE | Freq: Every day | ORAL | 0 refills | Status: DC

## 2021-09-09 NOTE — TOC Transition Note (Signed)
Transition of Care [facility]) - CM/SW Discharge Note   Patient Details  Name: [name] MRN: [id number] Date of Birth: 1965-10-08  Transition of Care [facility]) CM/SW Contact:  Emeterio Reeve, LCSW Phone Number: 09/09/2021, 4:18 PM   Clinical Narrative:     Per MD patient ready for DC to [facility]. RN, patient, patient's family, and facility notified of DC. Discharge Summary and FL2 sent to facility. DC packet on chart. Insurance Josem Kaufmann has been received and pt is covid negative. Ambulance transport requested for patient.    RN to call report to [phone].  CSW will sign off for now as social work intervention is no longer needed. Please consult [place] again if new needs arise.   Final next level of care: Skilled Nursing Facility Barriers to Discharge: Barriers Resolved   Patient Goals and CMS Choice Patient states their goals for this hospitalization and ongoing recovery are:: Rehab in hospital (CIR) CMS Medicare.gov Compare Post Acute Care list provided to:: Patient Choice offered to / list presented to : Patient  Discharge Placement              Patient chooses bed at: [facility] Patient to be transferred to facility by: ptar Name of family member notified: pt oriented x4 Patient and family notified of of transfer: 09/09/21  Discharge Plan and Services In-house Referral: Clinical Social Work Discharge Planning Services: CM Consult, Follow-up appt scheduled Post Acute Care Choice: [place]                               Social Determinants of Health (SDOH) Interventions     Readmission Risk Interventions No flowsheet data found.  Emeterio Reeve, LCSW Clinical Social Worker

## 2021-09-09 NOTE — Discharge Summary (Addendum)
[name] VZS:[id number] DOB: [birth year] DOA: 07/21/2021  PCP: Patient, No Pcp Per (Inactive)  Admit date: 07/21/2021  Discharge date: 09/09/2021  Admitted From: home   Disposition:  SNF   Recommendations for Outpatient Follow-up:   Follow up with PCP in 1-2 weeks Follow-up with orthopedics as scheduled Follow-up with cardiology as scheduled   Home Health: SNF Equipment/Devices: SNF Consultations: Cardiology, PCCM, orthopedics, urology, general surgery Discharge Condition: Stable CODE STATUS: Partial code--readdress and clarify with patient Diet Recommendation: Heart Healthy carb modified  Diet Order             Diet - low sodium heart healthy           Diet Carb Modified           Diet Carb Modified Fluid consistency: Thin; Room service appropriate? Yes  Diet effective now                    Chief Complaint  Patient presents with   Code Sepsis     Brief history of present illness from the day of admission and additional interim summary    [name] is a 56 y.o. male with medical history significant of DM2, HTN, dCHF, prior BKA on the R due to prior diabetic foot infection, seen by Dr. Sharol Given in office most recently in Aug this year, CKD 3a.   Pt presents to ED with c/o 1 week h/o L great toe progressive infection at site of prior L great toe amputation.  This has especially progressively worsened over the course of the past 2-3 days with purulent drainage, erythema, necrosis, skin sloughing.                                                                  Hospital Course   56 year old male with history of DM-2, prior right BKA, HFpEF, hypertension, dyslipidemia presented with left diabetic foot-resulting in septic  shock with group B streptococcal bacteremia.  He was initially managed in the ICU-orthopedics performed left transtibial amputation on 11/6.  Hospital course was prolonged and complicated by thoracic discitis/osteomyelitis/epidural abscess, new diagnosis of HFrEF, atrial flutter, sacral decubitus ulcer.  He was evaluated by cardiology, neurosurgery, and infectious disease.    Full-thickness sacral decubitus Discussed with patient the need to mobilize and get pressure off his sacrum Patient feels that he is not getting adequate support and that  he needs gel pads and other modalities that he is not getting as much as he would like. Wound care team following-undergoing hydrotherapy 3 times a week but per PT/wound care-does not need inpatient stay to continue with hydrotherapy.   Can transition to bedside wound care on discharge when SNF bed available.   Acute thoracic discitis/osteomyelitis (T6-T7 )with epidural abscess:  Continues to have acute on chronic back pain but exam has been very stable.  Reevaluated by neurosurgery and patient's request on 12/27 with recommendations to continue supportive care.   Last MRI on 12/16 had shown improvement.  Needs to follow-up with Dr. Shanon Brow Jones-neurosurgery in 2-3 weeks post discharge.    New onset atrial flutter s/p TEE/DCCV on 11/12-maintaining sinus rhythm-on amiodarone/Eliquis.   Cardiology no longer following.   Will need to follow-up with cardiology in the outpatient setting.   DM-2  Sugars under good control on present regimen Continue Levemir 36 units twice daily, 6 units of NovoLog with meals and SSI.    Acute on chronic pain syndrome Chronic pain from sacral decubitus ulcer and thoracic discitis/osteomyelitis  Continue OxyContin 40 mg twice daily. Continue Dilaudid for breakthrough pain.   Abnormal UA Patient is afebrile, no leukocytosis and no symptomatology At this point this is still asymptomatic bacteriuria Would have low threshold  for treating if he were to develop any symptoms of acute infection Can consider treatment with 1 dose of fosfomycin if warranted   Inguinal adenopathy Very possibly secondary to his discitis/OM with epidural abscess as well as his full-thickness sacral decubitus ulcer Insomnia: Continue trazodone   Septic shock due to left diabetic foot infection, Streptococcus agalactiae bacteremia and thoracic discitis: Sepsis physiology has resolved-did require ICU stay during the early part of this hospitalization for pressors.  No longer on IV vancomycin-has been transitioned to oral cefadroxil on 12/27-patient is s/p left BKA   Left diabetic foot infection: S/p left BKA on 11/16-wound VAC has been removed-Staples removed on 12/16.  Recommendations from orthopedics for dry dressing.   Group B streptococcus bacteremia: No vegetation seen on TEE-repeat blood cultures on 11/17 negative so far.  ID following-no longer on vancomycin-has been transitioned to cefadroxil 1 g twice daily for 4 weeks from 12/27.  Remains afebrile-no leukocytosis.     New onset systolic heart failure (EF 35-40% by TEE on 11/22): Volume status stable.  BP continues to fluctuate (on narcotics as well)-soft this morning-we will continue metoprolol but stop Imdur/hydralazine and Demadex.  Per cardiology note-not a candidate for ACE/ARB/ARNI due to renal dysfunction.  Plans are to follow with cardiology in the outpatient setting.  AKI on CKD stage IIIa: AKI hemodynamically mediated-creatinine had improved.    Acute urinary retention: Foley catheter removed on 12/19-voiding spontaneously.  Continue Flomax.     Discharge diagnosis     Principal Problem:   Discitis of thoracic region Active Problems:   Essential hypertension   Stage 3a chronic kidney disease (HCC)   Type 2 diabetes mellitus with diabetic polyneuropathy, with long-term current use of insulin (HCC)   Right below-knee amputee (HCC)   Diabetic infection of left foot  (HCC)   Chronic diastolic (congestive) heart failure (HCC)   Severe sepsis with acute organ dysfunction (HCC)   Chest pain   AKI (acute kidney injury) ([place])   Gas gangrene of foot (HCC)   Atrial flutter (HCC)   Septic shock (HCC)   Systolic dysfunction   Pressure injury of skin   Bacteremia due to group B Streptococcus  Discharge instructions    Discharge Instructions     Diet - low sodium heart healthy   Complete by: As directed    Diet Carb Modified   Complete by: As directed    Discharge wound care:   Complete by: As directed    Apply 1/4% Dakin's moist gauze, packed into wound bed, cover with dry dressing.  Change dressings every day at that hydrotherapy performed.  Sacral hydrotherapy Monday Wednesday Friday after therapy placed Santyl and moist gauze dressing   Increase activity slowly   Complete by: As directed        Discharge Medications   Allergies as of 09/09/2021       Reactions   Bactrim [sulfamethoxazole-trimethoprim]    Ceprotin [protein C Concentrate (human)]    Ciprofloxacin Other (See Comments)   Kidney function   Levaquin [levofloxacin]         Medication List     STOP taking these medications    furosemide 40 MG tablet Commonly known as: LASIX   ibuprofen 200 MG tablet Commonly known as: ADVIL   insulin NPH Human 100 UNIT/ML injection Commonly known as: NOVOLIN N   lisinopril 10 MG tablet Commonly known as: ZESTRIL   metoprolol tartrate 100 MG tablet Commonly known as: LOPRESSOR   Omega 3 1000 MG Caps   Oxycodone HCl 10 MG Tabs Replaced by: oxyCODONE 40 mg 12 hr tablet   potassium chloride SA 20 MEQ tablet Commonly known as: KLOR-CON M   sildenafil 100 MG tablet Commonly known as: VIAGRA   sildenafil 50 MG tablet Commonly known as: Viagra       TAKE these medications    amiodarone 200 MG tablet Commonly known as: PACERONE Take 1 tablet (200 mg total) by mouth daily. Start taking on: September 10, 2021    apixaban 5 MG Tabs tablet Commonly known as: ELIQUIS Take 1 tablet (5 mg total) by mouth 2 (two) times daily.   cefadroxil 500 MG capsule Commonly known as: DURICEF Take 2 capsules (1,000 mg total) by mouth 2 (two) times daily for 44 doses.   Chlorhexidine Gluconate Cloth 2 % Pads Apply 6 each topically daily for 10 days. Start taking on: September 10, 2021   collagenase ointment Commonly known as: SANTYL Apply topically daily. Start taking on: September 10, 2021   Ensure Max Protein Liqd Take 330 mLs (11 oz total) by mouth 2 (two) times daily between meals for 10 days.   finasteride 5 MG tablet Commonly known as: PROSCAR Take 1 tablet (5 mg total) by mouth daily. Start taking on: September 10, 2021   gabapentin 400 MG capsule Commonly known as: NEURONTIN Take 1 capsule (400 mg total) by mouth 3 (three) times daily.   HYDROmorphone 2 MG tablet Commonly known as: Dilaudid Take 1 tablet (2 mg total) by mouth every 4 (four) hours as needed for up to 2 days for severe pain.   insulin aspart 100 UNIT/ML injection Commonly known as: novoLOG Inject 6 Units into the skin 3 (three) times daily with meals.   insulin detemir 100 UNIT/ML injection Commonly known as: LEVEMIR Inject 0.36 mLs (36 Units total) into the skin 2 (two) times daily.   liver oil-zinc oxide 40 % ointment Commonly known as: DESITIN Apply topically daily as needed for irritation.   Methocarbamol 1000 MG Tabs Take 1,000 mg by mouth 3 (three) times daily.   metoprolol succinate 25 MG 24 hr tablet Commonly known as: TOPROL-XL Take 1 tablet (25 mg total) by  mouth daily. Start taking on: September 10, 2021   multivitamin with minerals Tabs tablet Take 1 tablet by mouth daily. Start taking on: September 10, 2021   nystatin cream Commonly known as: MYCOSTATIN Apply topically 2 (two) times daily.   oxyCODONE 40 mg 12 hr tablet Commonly known as: OXYCONTIN Take 1 tablet (40 mg total) by mouth every 12 (twelve) hours  for 2 days. Replaces: Oxycodone HCl 10 MG Tabs   silver nitrate applicators 27-03 % applicator Apply 1 Stick (1 application total) topically once for 1 dose.   sodium hypochlorite 0.125 % Soln Commonly known as: DAKIN'S 1/4 STRENGTH Irrigate with as directed daily for 10 days. Start taking on: September 10, 2021   tamsulosin 0.4 MG Caps capsule Commonly known as: FLOMAX Take 1 capsule (0.4 mg total) by mouth daily. Start taking on: September 10, 2021   testosterone 50 MG/5GM (1%) Gel Commonly known as: ANDROGEL Place 5 g onto the skin daily. Start taking on: September 10, 2021   traZODone 150 MG tablet Commonly known as: DESYREL Take 1 tablet (150 mg total) by mouth at bedtime as needed for up to 15 days for sleep.               Discharge Care Instructions  (From admission, onward)           Start     Ordered   09/09/21 0000  Discharge wound care:       Comments: Apply 1/4% Dakin's moist gauze, packed into wound bed, cover with dry dressing.  Change dressings every day at that hydrotherapy performed.  Sacral hydrotherapy Monday Wednesday Friday after therapy placed Santyl and moist gauze dressing   09/09/21 1529             Follow-up Information     [name], MD Follow up in 1 week(s).   Specialty: Orthopedic Surgery Contact information: [place] [address] [place] Follow up on 08/26/2021.   Why: @ 2:30 pm for hospital follow up appointment with Juluis Mire. If you cannot make this scheduled appointment please call the office to reschedule. Contact information: [place] [id number] [phone]        Eustace Moore, MD. Schedule an appointment as soon as possible for a visit in 3 week(s).   Specialty: Neurosurgery Contact information: [address] [place] [address] [place] [address] [phone]                  Major procedures and Radiology Reports - PLEASE review detailed and final reports thoroughly  -      MR THORACIC SPINE W WO CONTRAST  Result Date: 08/22/2021 CLINICAL DATA:  Follow-up discitis and osteomyelitis and epidural abscess. EXAM: MRI THORACIC WITHOUT AND WITH CONTRAST TECHNIQUE: Multiplanar and multiecho pulse sequences of the thoracic spine were obtained without and with intravenous contrast. CONTRAST:  58mL GADAVIST GADOBUTROL 1 MMOL/ML IV SOLN COMPARISON:  MRI thoracic spine 08/07/2021 FINDINGS: Alignment:  Normal Vertebrae: Discitis and osteomyelitis at T6-7 appears improved. There is decreased pus within the disc space. There is endplate erosion at C5-8. There remains significant surrounding bone marrow edema and enhancement extending from T5 through T8. There is edema extending into the pedicle and posterior elements of T5 and T6 as noted previously. No new level of infection in the thoracic spine Cord: Cord evaluation limited by motion. Posterior epidural abscess  has improved in the interval. The abscess is located in the posterior epidural space and appears smaller. There remains moderate spinal stenosis at T6-7 due to posterior epidural abscess as well as ventral epidural thickening. The cord is flattened due to spinal stenosis, similar to the prior study. Previously noted abscess at T3-4 on the right has significantly improved. Paraspinal and other soft tissues: Small bilateral layering pleural effusions have improved in the interval. There is paraspinous soft tissue thickening T5 through T9 compatible with acute infection. Disc levels: Discitis and osteomyelitis at T6-7 as described above. Bone marrow edema and likely infection extends from T5-T8. Mild disc degeneration at other levels in the thoracic spine without significant spinal stenosis or interval change. IMPRESSION: 1. Interval improvement in discitis and osteomyelitis centered at T6-7. Bone marrow edema and enhancement  extends into T5 and T8. Posterior epidural abscess centered at T6-7 has improved. There remains moderate spinal stenosis with deformity of the cord due to stenosis from the abscess. This is similar in degree to the prior study. 2. No interval fracture. No new area of infection in the thoracic spine. 3. Small bilateral pleural effusions have improved in the interval. Electronically Signed   By: Franchot Gallo M.D.   On: 08/22/2021 12:30   IR US CHEST  Result Date: 08/13/2021 CLINICAL DATA:  Bilateral pleural effusions by prior CT. EXAM: CHEST ULTRASOUND COMPARISON:  CT of the chest on 08/10/2021 FINDINGS: By ultrasound, there are only very small bilateral pleural effusions. There was not enough fluid present to allow for safe thoracentesis at this time. IMPRESSION: Small volume bilateral pleural effusions. There was not enough fluid present to warrant thoracentesis currently. Electronically Signed   By: Aletta Edouard M.D.   On: 08/13/2021 12:59   [place] EKG SITE RITE  Result Date: 08/13/2021 If Site Rite image not attached, placement could not be confirmed due to current cardiac rhythm.   Micro Results    Recent Results (from the past 240 hour(s))  Resp Panel by RT-PCR (Flu A&B, Covid) Nasopharyngeal Swab     Status: None   Collection Time: 09/06/21  8:56 AM   Specimen: Nasopharyngeal Swab; Nasopharyngeal(NP) swabs in vial transport medium  Result Value Ref Range Status   SARS Coronavirus 2 by RT PCR NEGATIVE NEGATIVE Final    Comment: (NOTE) SARS-CoV-2 target nucleic acids are NOT DETECTED.  The SARS-CoV-2 RNA is generally detectable in upper respiratory specimens during the acute phase of infection. The lowest concentration of SARS-CoV-2 viral copies this assay can detect is 138 copies/mL. A negative result does not preclude SARS-Cov-2 infection and should not be used as the sole basis for treatment or other patient management decisions. A negative result may occur with  improper specimen  collection/handling, submission of specimen other than nasopharyngeal swab, presence of viral mutation(s) within the areas targeted by this assay, and inadequate number of viral copies(<138 copies/mL). A negative result must be combined with clinical observations, patient history, and epidemiological information. The expected result is Negative.  Fact Sheet for Patients:  EntrepreneurPulse.com.au  Fact Sheet for Healthcare Providers:  IncredibleEmployment.be  This test is no t yet approved or cleared by the [place] FDA and  has been authorized for detection and/or diagnosis of SARS-CoV-2 by FDA under an Emergency Use Authorization (EUA). This EUA will remain  in effect (meaning this test can be used) for the duration of the COVID-19 declaration under Section 564(b)(1) of the Act, 21 U.S.C.section 360bbb-3(b)(1), unless the authorization is terminated  or revoked sooner.  Influenza A by PCR NEGATIVE NEGATIVE Final   Influenza B by PCR NEGATIVE NEGATIVE Final    Comment: (NOTE) The Xpert Xpress SARS-CoV-2/FLU/RSV plus assay is intended as an aid in the diagnosis of influenza from Nasopharyngeal swab specimens and should not be used as a sole basis for treatment. Nasal washings and aspirates are unacceptable for Xpert Xpress SARS-CoV-2/FLU/RSV testing.  Fact Sheet for Patients: EntrepreneurPulse.com.au  Fact Sheet for Healthcare Providers: IncredibleEmployment.be  This test is not yet approved or cleared by the [place] FDA and has been authorized for detection and/or diagnosis of SARS-CoV-2 by FDA under an Emergency Use Authorization (EUA). This EUA will remain in effect (meaning this test can be used) for the duration of the COVID-19 declaration under Section 564(b)(1) of the Act, 21 U.S.C. section 360bbb-3(b)(1), unless the authorization is terminated or revoked.  Performed at [facility], [place] [address], [place], [place] [address]     Today   Subjective    [name] feels much improved since admission.  Feels ready to go home.  Denies chest pain, shortness of breath or abdominal pain.  Feels they can take care of themselves with the resources they have at home.  Objective   Blood pressure 127/80, pulse 75, temperature 98.1 F (36.7 C), temperature source Oral, resp. rate 18, height 6\' 3"  (1.905 m), weight 107 kg, SpO2 96 %.   Intake/Output Summary (Last 24 hours) at 09/09/2021 1711 Last data filed at 09/09/2021 0956 Gross per 24 hour  Intake 483 ml  Output 2426 ml  Net -1943 ml   Exam General: Patient lying in bed on his right side in no respiratory distress  eyes: sclera anicteric, conjuctiva mild injection bilaterally CVS: S1-S2, regular  Respiratory:  decreased air entry bilaterally secondary to decreased inspiratory effort, rales at bases  GI: NABS, soft, NT, patient does have bilateral inguinal adenopathy approximately 6 to 8 mm bilaterally that are mildly tender. LE: S/p bilateral BKA Neuro: A/O x 3, grossly nonfocal.    Data Review   CBC w Diff:  Lab Results  Component Value Date   WBC 8.4 09/07/2021   HGB 11.4 (L) 09/07/2021   HCT 35.4 (L) 09/07/2021   PLT 233 09/07/2021   LYMPHOPCT 3 08/06/2021   MONOPCT 4 08/06/2021   EOSPCT 0 08/06/2021   BASOPCT 0 08/06/2021    CMP:  Lab Results  Component Value Date   NA 134 (L) 09/07/2021   NA 144 05/09/2021   K 4.1 09/07/2021   CL 98 09/07/2021   CO2 26 09/07/2021   BUN 22 (H) 09/07/2021   BUN 26 (H) 05/09/2021   CREATININE 1.24 09/07/2021   PROT 5.8 (L) 08/20/2021   PROT 6.9 05/09/2021   ALBUMIN 1.9 (L) 08/20/2021   ALBUMIN 4.0 05/09/2021   BILITOT 0.4 08/20/2021   BILITOT 0.5 05/09/2021   ALKPHOS 108 08/20/2021   AST 20 08/20/2021   ALT 33 08/20/2021  .   Total Time in preparing paper work, data evaluation and todays exam - 35 minutes  Vashti Hey  M.D on 09/09/2021 at 5:11 PM  [facility]

## 2021-09-09 NOTE — Progress Notes (Incomplete)
Pt able to rest well overnight with minimal interruption. When patient woke up during morning VS check, pt c/o 8/10 pain. Offered to try oxycodone first prior to resorting to IV pain medication, but pt expressed that he is been without pain meds overnight and would like to try the IV for quicker relief.

## 2021-09-09 NOTE — Progress Notes (Signed)
OT Cancellation Note  Patient Details Name: [name] MRN: [id number] DOB: 1965-12-06   Cancelled Treatment:    Reason Eval/Treat Not Completed: Patient declined, no reason specified. Pt does not want to participate in OT today, states he is leaving soon.  Lynnda Child, OTD, OTR/L Acute Rehab [phone]   Kaylyn Lim 09/09/2021, 4:11 PM

## 2021-09-09 NOTE — Discharge Summary (Deleted)
[name] FBP:[id number] DOB: Jul 08, 1966 DOA: 07/21/2021  PCP: Patient, No Pcp Per (Inactive)  Admit date: 07/21/2021  Discharge date: 09/09/2021  Admitted From: home   Disposition:  SNF   Recommendations for Outpatient Follow-up:   Follow up with PCP in 1-2 weeks Follow-up with orthopedics as scheduled Follow-up with cardiology as scheduled   Home Health: SNF Equipment/Devices: SNF Consultations: Cardiology, PCCM, orthopedics, urology, general surgery Discharge Condition: Stable CODE STATUS: Partial code--readdress and clarify with patient Diet Recommendation: Heart Healthy carb modified  Diet Order             Diet - low sodium heart healthy           Diet Carb Modified           Diet Carb Modified Fluid consistency: Thin; Room service appropriate? Yes  Diet effective now                    Chief Complaint  Patient presents with   Code Sepsis     Brief history of present illness from the day of admission and additional interim summary    [name] is a 57 y.o. male with medical history significant of DM2, HTN, dCHF, prior BKA on the R due to prior diabetic foot infection, seen by Dr. Sharol Given in office most recently in Aug this year, CKD 3a.   Pt presents to ED with c/o 1 week h/o L great toe progressive infection at site of prior L great toe amputation.  This has especially progressively worsened over the course of the past 2-3 days with purulent drainage, erythema, necrosis, skin sloughing.                                                                  Hospital Course   55 year old male with history of DM-2, prior right BKA, HFpEF, hypertension, dyslipidemia presented with left diabetic foot-resulting in septic shock with group B streptococcal bacteremia.  He was initially managed in  the ICU-orthopedics performed left transtibial amputation on 11/6.  Hospital course was prolonged and complicated by thoracic discitis/osteomyelitis/epidural abscess, new diagnosis of HFrEF, atrial flutter, sacral decubitus ulcer.  He was evaluated by cardiology, neurosurgery, and infectious disease.    Full-thickness sacral decubitus Discussed with patient the need to mobilize and get pressure off his sacrum Patient feels that he is not getting adequate support and that he needs gel pads and other modalities that he is not getting as much as he would like. Wound care team following-undergoing hydrotherapy 3 times a week but per PT/wound care-does not need inpatient stay to continue with hydrotherapy.   Can transition to bedside wound care on discharge when SNF bed available.   Acute thoracic discitis/osteomyelitis (T6-T7 )with epidural abscess:  Continues to have acute on chronic back  pain but exam has been very stable.  Reevaluated by neurosurgery and patient's request on 12/27 with recommendations to continue supportive care.   Last MRI on 12/16 had shown improvement.  Needs to follow-up with Dr. Shanon Brow Jones-neurosurgery in 2-3 weeks post discharge.    New onset atrial flutter s/p TEE/DCCV on 11/12-maintaining sinus rhythm-on amiodarone/Eliquis.   Cardiology no longer following.   Will need to follow-up with cardiology in the outpatient setting.   DM-2  Sugars under good control on present regimen Continue Levemir 36 units twice daily, 6 units of NovoLog with meals and SSI.    Acute on chronic pain syndrome Chronic pain from sacral decubitus ulcer and thoracic discitis/osteomyelitis  Continue OxyContin 40 mg twice daily. Continue Dilaudid for breakthrough pain.   Abnormal UA Patient is afebrile, no leukocytosis and no symptomatology At this point this is still asymptomatic bacteriuria Would have low threshold for treating if he were to develop any symptoms of acute infection Can  consider treatment with 1 dose of fosfomycin if warranted   Inguinal adenopathy Very possibly secondary to his discitis/OM with epidural abscess as well as his full-thickness sacral decubitus ulcer Insomnia: Continue trazodone   Septic shock due to left diabetic foot infection, Streptococcus agalactiae bacteremia and thoracic discitis: Sepsis physiology has resolved-did require ICU stay during the early part of this hospitalization for pressors.  No longer on IV vancomycin-has been transitioned to oral cefadroxil on 12/27-patient is s/p left BKA   Left diabetic foot infection: S/p left BKA on 11/16-wound VAC has been removed-Staples removed on 12/16.  Recommendations from orthopedics for dry dressing.   Group B streptococcus bacteremia: No vegetation seen on TEE-repeat blood cultures on 11/17 negative so far.  ID following-no longer on vancomycin-has been transitioned to cefadroxil 1 g twice daily for 4 weeks from 12/27.  Remains afebrile-no leukocytosis.     New onset systolic heart failure (EF 35-40% by TEE on 11/22): Volume status stable.  BP continues to fluctuate (on narcotics as well)-soft this morning-we will continue metoprolol but stop Imdur/hydralazine and Demadex.  Per cardiology note-not a candidate for ACE/ARB/ARNI due to renal dysfunction.  Plans are to follow with cardiology in the outpatient setting.  AKI on CKD stage IIIa: AKI hemodynamically mediated-creatinine had improved.    Acute urinary retention: Foley catheter removed on 12/19-voiding spontaneously.  Continue Flomax.      Discharge diagnosis     Principal Problem:   Discitis of thoracic region Active Problems:   Essential hypertension   Stage 3a chronic kidney disease (HCC)   Type 2 diabetes mellitus with diabetic polyneuropathy, with long-term current use of insulin (HCC)   Right below-knee amputee (HCC)   Diabetic infection of left foot (HCC)   Chronic diastolic (congestive) heart failure (HCC)   Severe  sepsis with acute organ dysfunction (HCC)   Chest pain   AKI (acute kidney injury) ([place])   Gas gangrene of foot (HCC)   Atrial flutter (HCC)   Septic shock (HCC)   Systolic dysfunction   Pressure injury of skin   Bacteremia due to group B Streptococcus    Discharge instructions    Discharge Instructions     Diet - low sodium heart healthy   Complete by: As directed    Diet Carb Modified   Complete by: As directed    Discharge wound care:   Complete by: As directed    Apply 1/4% Dakin's moist gauze, packed into wound bed, cover with dry dressing.  Change dressings every day  at that hydrotherapy performed.  Sacral hydrotherapy Monday Wednesday Friday after therapy placed Santyl and moist gauze dressing   Increase activity slowly   Complete by: As directed        Discharge Medications       Follow-up Information     [name], MD Follow up in 1 week(s).   Specialty: Orthopedic Surgery Contact information: [place] [address] [place] Follow up on 08/26/2021.   Why: @ 2:30 pm for hospital follow up appointment with Juluis Mire. If you cannot make this scheduled appointment please call the office to reschedule. Contact information: [place] [address] [phone]        Eustace Moore, MD. Schedule an appointment as soon as possible for a visit in 3 week(s).   Specialty: Neurosurgery Contact information: [address] [place] [address] [phone]                 Major procedures and Radiology Reports - PLEASE review detailed and final reports thoroughly  -     MR THORACIC SPINE W WO CONTRAST  Result Date: 08/22/2021 CLINICAL DATA:  Follow-up discitis and osteomyelitis and epidural abscess. EXAM: MRI THORACIC WITHOUT AND WITH CONTRAST TECHNIQUE: Multiplanar and multiecho pulse sequences of the  thoracic spine were obtained without and with intravenous contrast. CONTRAST:  34mL GADAVIST GADOBUTROL 1 MMOL/ML IV SOLN COMPARISON:  MRI thoracic spine 08/07/2021 FINDINGS: Alignment:  Normal Vertebrae: Discitis and osteomyelitis at T6-7 appears improved. There is decreased pus within the disc space. There is endplate erosion at I3-4. There remains significant surrounding bone marrow edema and enhancement extending from T5 through T8. There is edema extending into the pedicle and posterior elements of T5 and T6 as noted previously. No new level of infection in the thoracic spine Cord: Cord evaluation limited by motion. Posterior epidural abscess has improved in the interval. The abscess is located in the posterior epidural space and appears smaller. There remains moderate spinal stenosis at T6-7 due to posterior epidural abscess as well as ventral epidural thickening. The cord is flattened due to spinal stenosis, similar to the prior study. Previously noted abscess at T3-4 on the right has significantly improved. Paraspinal and other soft tissues: Small bilateral layering pleural effusions have improved in the interval. There is paraspinous soft tissue thickening T5 through T9 compatible with acute infection. Disc levels: Discitis and osteomyelitis at T6-7 as described above. Bone marrow edema and likely infection extends from T5-T8. Mild disc degeneration at other levels in the thoracic spine without significant spinal stenosis or interval change. IMPRESSION: 1. Interval improvement in discitis and osteomyelitis centered at T6-7. Bone marrow edema and enhancement extends into T5 and T8. Posterior epidural abscess centered at T6-7 has improved. There remains moderate spinal stenosis with deformity of the cord due to stenosis from the abscess. This is similar in degree to the prior study. 2. No interval fracture. No new area of infection in the thoracic spine. 3. Small bilateral pleural effusions have improved in  the interval. Electronically Signed   By: Franchot Gallo M.D.   On: 08/22/2021 12:30   IR US CHEST  Result Date: 08/13/2021 CLINICAL DATA:  Bilateral pleural effusions by prior CT. EXAM: CHEST ULTRASOUND COMPARISON:  CT of the chest on 08/10/2021 FINDINGS: By ultrasound, there are only very small bilateral pleural effusions. There was not enough fluid present  to allow for safe thoracentesis at this time. IMPRESSION: Small volume bilateral pleural effusions. There was not enough fluid present to warrant thoracentesis currently. Electronically Signed   By: Aletta Edouard M.D.   On: 08/13/2021 12:59   [place] EKG SITE RITE  Result Date: 08/13/2021 If Site Rite image not attached, placement could not be confirmed due to current cardiac rhythm.   Micro Results    Recent Results (from the past 240 hour(s))  Resp Panel by RT-PCR (Flu A&B, Covid) Nasopharyngeal Swab     Status: None   Collection Time: 09/06/21  8:56 AM   Specimen: Nasopharyngeal Swab; Nasopharyngeal(NP) swabs in vial transport medium  Result Value Ref Range Status   SARS Coronavirus 2 by RT PCR NEGATIVE NEGATIVE Final    Comment: (NOTE) SARS-CoV-2 target nucleic acids are NOT DETECTED.  The SARS-CoV-2 RNA is generally detectable in upper respiratory specimens during the acute phase of infection. The lowest concentration of SARS-CoV-2 viral copies this assay can detect is 138 copies/mL. A negative result does not preclude SARS-Cov-2 infection and should not be used as the sole basis for treatment or other patient management decisions. A negative result may occur with  improper specimen collection/handling, submission of specimen other than nasopharyngeal swab, presence of viral mutation(s) within the areas targeted by this assay, and inadequate number of viral copies(<138 copies/mL). A negative result must be combined with clinical observations, patient history, and epidemiological information. The expected result is  Negative.  Fact Sheet for Patients:  EntrepreneurPulse.com.au  Fact Sheet for Healthcare Providers:  IncredibleEmployment.be  This test is no t yet approved or cleared by the [place] FDA and  has been authorized for detection and/or diagnosis of SARS-CoV-2 by FDA under an Emergency Use Authorization (EUA). This EUA will remain  in effect (meaning this test can be used) for the duration of the COVID-19 declaration under Section 564(b)(1) of the Act, 21 U.S.C.section 360bbb-3(b)(1), unless the authorization is terminated  or revoked sooner.       Influenza A by PCR NEGATIVE NEGATIVE Final   Influenza B by PCR NEGATIVE NEGATIVE Final    Comment: (NOTE) The Xpert Xpress SARS-CoV-2/FLU/RSV plus assay is intended as an aid in the diagnosis of influenza from Nasopharyngeal swab specimens and should not be used as a sole basis for treatment. Nasal washings and aspirates are unacceptable for Xpert Xpress SARS-CoV-2/FLU/RSV testing.  Fact Sheet for Patients: EntrepreneurPulse.com.au  Fact Sheet for Healthcare Providers: IncredibleEmployment.be  This test is not yet approved or cleared by the [place] FDA and has been authorized for detection and/or diagnosis of SARS-CoV-2 by FDA under an Emergency Use Authorization (EUA). This EUA will remain in effect (meaning this test can be used) for the duration of the COVID-19 declaration under Section 564(b)(1) of the Act, 21 U.S.C. section 360bbb-3(b)(1), unless the authorization is terminated or revoked.  Performed at [facility], [place] [address], [place], [place] [address]     Today   Subjective    [name] feels much improved since admission.  Feels ready to go home.  Denies chest pain, shortness of breath or abdominal pain.  Feels they can take care of themselves with the resources they have at home.  Objective   Blood pressure 127/80,  pulse 75, temperature 98.1 F (36.7 C), temperature source Oral, resp. rate 18, height 6\' 3"  (1.905 m), weight 107 kg, SpO2 96 %.   Intake/Output Summary (Last 24 hours) at 09/09/2021 1710 Last data filed at 09/09/2021 [phone]  Gross per 24 hour  Intake 483 ml  Output 2426 ml  Net -1943 ml    Exam General: Patient lying in bed on his right side in no respiratory distress  eyes: sclera anicteric, conjuctiva mild injection bilaterally CVS: S1-S2, regular  Respiratory:  decreased air entry bilaterally secondary to decreased inspiratory effort, rales at bases  GI: NABS, soft, NT, patient does have bilateral inguinal adenopathy approximately 6 to 8 mm bilaterally that are mildly tender. LE: S/p bilateral BKA Neuro: A/O x 3, grossly nonfocal.       Data Review   CBC w Diff:  Lab Results  Component Value Date   WBC 8.4 09/07/2021   HGB 11.4 (L) 09/07/2021   HCT 35.4 (L) 09/07/2021   PLT 233 09/07/2021   LYMPHOPCT 3 08/06/2021   MONOPCT 4 08/06/2021   EOSPCT 0 08/06/2021   BASOPCT 0 08/06/2021    CMP:  Lab Results  Component Value Date   NA 134 (L) 09/07/2021   NA 144 05/09/2021   K 4.1 09/07/2021   CL 98 09/07/2021   CO2 26 09/07/2021   BUN 22 (H) 09/07/2021   BUN 26 (H) 05/09/2021   CREATININE 1.24 09/07/2021   PROT 5.8 (L) 08/20/2021   PROT 6.9 05/09/2021   ALBUMIN 1.9 (L) 08/20/2021   ALBUMIN 4.0 05/09/2021   BILITOT 0.4 08/20/2021   BILITOT 0.5 05/09/2021   ALKPHOS 108 08/20/2021   AST 20 08/20/2021   ALT 33 08/20/2021  .   Total Time in preparing paper work, data evaluation and todays exam - 68 minutes  Vashti Hey M.D on 09/09/2021 at 5:10 PM  [facility]

## 2021-09-09 NOTE — Progress Notes (Signed)
Attempted report to [employer]. No answer at this time. RN night shift nurse made aware.

## 2021-09-09 NOTE — Progress Notes (Signed)
Physical Therapy Treatment Patient Details Name: [name] MRN: [id number] DOB: 18-Oct-1965 Today's Date: 09/09/2021   History of Present Illness Pt is a 56 y.o. 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 S/p TEE, DCCV on 11/22. Chest CT 12/4 with pleural effusion; thoracentesis attempted with insufficient fluid volume. PMH includes R BKA, aflutter, DM, HTN, depression, anxiety.    PT Comments    Lengthy discussion about plan moving forward when working with PT in the hospital as pt adamantly refusing to sit in chair due to not having correct cushion he wants and intolerable pain/discomfort. Demonstated/verbalized exercises/stretches he has been doing in the bed, rolling side to side, repositioning for pressure relief, pulling up on trapeze bar, core work, Agricultural consultant. Able to roll without assist to remove bed pan/change pads. Discussed plan moving forward and importance of sitting upright/transferring to work on core and ability to get into w/c at rehab. Agreeable to practicing lateral scoot transfers to/from recliner for therapy sessions in the hospital after being premedicated but declines staying in chair due to intolerable pain in bottom/back. Set up schedule with patient that he agrees to moving forward. Feels he can manage bed leve exercises without assist/help and does them daily per report. Continue to recommend SNF. Will follow.   Recommendations for follow up therapy are one component of a multi-disciplinary discharge planning process, led by the attending physician.  Recommendations may be updated based on patient status, additional functional criteria and insurance authorization.  Follow Up Recommendations  Skilled nursing-short term rehab (<3 hours/day)      Assistance Recommended at Discharge Frequent or constant Supervision/Assistance  Patient can return home with the following A lot of help with walking and/or transfers;A lot of help with bathing/dressing/bathroom;Assistance with cooking/housework;Assist for transportation   Equipment Recommendations  Wheelchair (measurements PT);Wheelchair cushion (measurements PT);Hospital bed;Other (comment);BSC/3in1    Recommendations for Other Services       Precautions / Restrictions Precautions Precautions: Fall;Back Precaution Booklet Issued: No Precaution Comments: Back precautions for comfort Other Brace: LLE limb guard; has RLE prosthetic in room (not fitting currently per pt?) Restrictions Weight Bearing Restrictions: Yes LLE Weight Bearing: Non weight bearing     Mobility  Bed Mobility Overal bed mobility: Needs Assistance Bed Mobility: Rolling Rolling: Min guard         General bed mobility comments: Able to roll right and left to remove bed pan/place new pad using trapeze. Declines sitting EOB as it hurts too much or sitting in chair.    Transfers                        Ambulation/Gait                   Stairs             Wheelchair Mobility    Modified Rankin (Stroke Patients Only)       Balance                                            Cognition Arousal/Alertness: Awake/alert Behavior During Therapy: WFL for tasks assessed/performed Overall Cognitive Status: No family/caregiver present to determine baseline cognitive functioning  Exercises      General Comments General comments (skin integrity, edema, etc.): Lengthy discussion about plan moving forward when working with PT in the hospital; pt adamantly refusing to sit in chair due to not having correct cushion he wants (gel cushion with donut hole in middle; he heard about this from a friend who is "head  of nursing.") Demonstated/verbalized exercises/stretches he has been doing in the bed, rolling side to side, repositioning for pressure relief, pulling up on trapeze bar, core work, Agricultural consultant. Discussed plan moving forward and importance of sitting upright/transferring to work on core and ability to get into w/c at rehab. Agreeable to practicing lateral scoot transfers to/from recliner for therapy sessions in the hospital after being premedicated but declines staying in chair due to intolerable pain in bottom/back. Set up schedule with patient that he agrees to moving forward and will place in sticky note. Feels he can manage bed leve exercises without assist/help and does them daily per report.      Pertinent Vitals/Pain Pain Assessment: Faces Faces Pain Scale: Hurts whole lot Pain Location: back and wound site Pain Descriptors / Indicators: Grimacing;Sore Pain Intervention(s): Monitored during session;Patient requesting pain meds-RN notified;Limited activity within patient's tolerance    Home Living                          Prior Function            PT Goals (current goals can now be found in the care plan section) Progress towards PT goals: Not progressing toward goals - comment (pain with sitting and not having pt's selected cushion)    Frequency    Min 2X/week      PT Plan Current plan remains appropriate    Co-evaluation              AM-PAC PT "6 Clicks" Mobility   Outcome Measure  Help needed turning from your back to your side while in a flat bed without using bedrails?: A Little Help needed moving from lying on your back to sitting on the side of a flat bed without using bedrails?: A Lot Help needed moving to and from a bed to a chair (including a wheelchair)?: A Lot Help needed standing up from a chair using your arms (e.g., wheelchair or bedside chair)?: Total Help needed to walk in hospital room?: Total Help needed climbing 3-5 steps with a  railing? : Total 6 Click Score: 10    End of Session   Activity Tolerance: Patient limited by pain Patient left: in bed;with call bell/phone within reach Nurse Communication: Mobility status PT Visit Diagnosis: Other abnormalities of gait and mobility (R26.89);Muscle weakness (generalized) (M62.81);Pain     Time: 1403-1430 PT Time Calculation (min) (ACUTE ONLY): 27 min  Charges:  $Self Care/Home Management: [place], PT, DPT Acute Rehabilitation Services Pager [phone] Office [phone]      [place] 09/09/2021, 2:53 PM

## 2021-09-09 NOTE — Progress Notes (Deleted)
PROGRESS NOTE    [name]  BSJ:[id number]  DOB: 04-01-1966  DOA: 07/21/2021 PCP: Patient, No Pcp Per (Inactive) Outpatient Specialists:   Hospital course:  56 year old male with history of DM-2, prior right BKA, HFpEF, hypertension, dyslipidemia presented with left diabetic foot-resulting in septic shock with group B streptococcal bacteremia.  He was initially managed in the ICU-orthopedics performed left transtibial amputation on 11/6.  Hospital course was prolonged and complicated by thoracic discitis/osteomyelitis/epidural abscess, new diagnosis of HFrEF, atrial flutter, sacral decubitus ulcer.  He was evaluated by cardiology, neurosurgery, and infectious disease. Patient presently awaiting insurance authorization for SNF.   Subjective:  Patient feels hopeful today. He showed me pictures of him 3 months ago--is a serious weight lifter. He is pleased to have a bar above his bed so he can work on maintaining his upper body strength.    Objective: Vitals:   09/08/21 1500 09/08/21 2200 09/09/21 0552 09/09/21 0836  BP: 139/66 (!) 141/80 124/68 127/80  Pulse: 64 77 65 75  Resp: 19 18 16 18   Temp: 98 F (36.7 C) 98.7 F (37.1 C) 97.9 F (36.6 C) 98.1 F (36.7 C)  TempSrc: Oral Oral Oral Oral  SpO2: 96% 97% 98% 96%  Weight:      Height:        Intake/Output Summary (Last 24 hours) at 09/09/2021 1708 Last data filed at 09/09/2021 0956 Gross per 24 hour  Intake 483 ml  Output 2426 ml  Net -1943 ml   Filed Weights   07/27/21 0252 07/29/21 0754 08/01/21 0459  Weight: 108.2 kg 108.2 kg 107 kg     Exam:  General: Patient lying in bed on his right side in no respiratory distress  eyes: sclera anicteric, conjuctiva mild injection bilaterally CVS: S1-S2, regular  Respiratory:  decreased air entry bilaterally secondary to decreased inspiratory effort, rales at bases  GI: NABS, soft, NT, patient does have bilateral inguinal adenopathy approximately 6 to 8 mm bilaterally  that are mildly tender. LE: S/p bilateral BKA Neuro: A/O x 3, grossly nonfocal.    Assessment & Plan:   Disposition Patient is awaiting transfer to a SNF bed, awaiting insurance authorization  Abnormal UA Patient is afebrile, no leukocytosis and no symptomatology At this point this is still asymptomatic bacteriuria Would have low threshold for treating if he were to develop any symptoms of acute infection Can consider treatment with 1 dose of fosfomycin if warranted  Inguinal adenopathy Very possibly secondary to his discitis/OM with epidural abscess as well as his full-thickness sacral decubitus ulcer  Full-thickness sacral decubitus Discussed with patient the need to mobilize and get pressure off his sacrum Patient feels that he is not getting adequate support and that he needs gel pads and other modalities that he is not getting as much as he would like. Wound care team following-undergoing hydrotherapy 3 times a week but per PT/wound care-does not need inpatient stay to continue with hydrotherapy.   Can transition to bedside wound care on discharge when SNF bed available.  Acute thoracic discitis/osteomyelitis (T6-T7 )with epidural abscess:  Continues to have acute on chronic back pain but exam has been very stable.  Reevaluated by neurosurgery and patient's request on 12/27 with recommendations to continue supportive care.   Last MRI on 12/16 had shown improvement.  Needs to follow-up with Dr. Shanon Brow Jones-neurosurgery in 2-3 weeks post discharge.   New onset atrial flutter s/p TEE/DCCV on 11/12-maintaining sinus rhythm-on amiodarone/Eliquis.   Cardiology no longer following.   Will  need to follow-up with cardiology in the outpatient setting.  DM-2  Sugars under good control on present regimen Continue Levemir 36 units twice daily, 6 units of NovoLog with meals and SSI.     Insomnia: Continue trazodone  Copied from note by Dr. Sloan Leiter:  We will continue therapeutic  regimen already in place:  Septic shock due to left diabetic foot infection, Streptococcus agalactiae bacteremia and thoracic discitis: Sepsis physiology has resolved-did require ICU stay during the early part of this hospitalization for pressors.  No longer on IV vancomycin-has been transitioned to oral cefadroxil on 12/27-patient is s/p left BKA   Left diabetic foot infection: S/p left BKA on 11/16-wound VAC has been removed-Staples removed on 12/16.  Recommendations from orthopedics for dry dressing.   Group B streptococcus bacteremia: No vegetation seen on TEE-repeat blood cultures on 11/17 negative so far.  ID following-no longer on vancomycin-has been transitioned to cefadroxil 1 g twice daily for 4 weeks from 12/27.  Remains afebrile-no leukocytosis.     New onset systolic heart failure (EF 35-40% by TEE on 11/22): Volume status stable.  BP continues to fluctuate (on narcotics as well)-soft this morning-we will continue metoprolol but stop Imdur/hydralazine and Demadex.  Per cardiology note-not a candidate for ACE/ARB/ARNI due to renal dysfunction.  Plans are to follow with cardiology in the outpatient setting.  AKI on CKD stage IIIa: AKI hemodynamically mediated-creatinine had improved.    Acute urinary retention: Foley catheter removed on 12/19-voiding spontaneously.  Continue Flomax.    DVT prophylaxis: Eliquis Code Status: Partial code Family Communication: Spoke with patient's friend on the phone per his request Disposition Plan:   Patient is from: Home  Anticipated Discharge Location: SNF  Barriers to Discharge: Awaiting insurance authorization for SNF bed  Is patient medically stable for Discharge: Yes   Scheduled Meds:  acetaminophen  1,000 mg Oral TID   amiodarone  200 mg Oral Daily   apixaban  5 mg Oral BID   vitamin C  1,000 mg Oral Daily   cefadroxil  1,000 mg Oral BID   Chlorhexidine Gluconate Cloth  6 each Topical Daily   collagenase   Topical Daily   finasteride   5 mg Oral Daily   gabapentin  400 mg Oral TID   insulin aspart  0-15 Units Subcutaneous TID WC   insulin aspart  6 Units Subcutaneous TID WC   insulin detemir  36 Units Subcutaneous BID   methocarbamol  1,000 mg Oral Q8H   metoprolol succinate  25 mg Oral Daily   multivitamin with minerals  1 tablet Oral Daily   nystatin cream   Topical BID   oxyCODONE  40 mg Oral Q12H   Ensure Max Protein  11 oz Oral BID BM   senna-docusate  2 tablet Oral QHS   silver nitrate applicators  1 application Topical Once   sodium chloride flush  10-40 mL Intracatheter Q12H   sodium hypochlorite   Irrigation Daily   tamsulosin  0.4 mg Oral Daily   testosterone  5 g Transdermal Daily   Continuous Infusions:  sodium chloride Stopped (08/28/21 1720)    Data Reviewed:  Basic Metabolic Panel: Recent Labs  Lab 09/04/21 0401 09/07/21 0424  NA 133* 134*  K 4.0 4.1  CL 98 98  CO2 28 26  GLUCOSE 123* 122*  BUN 21* 22*  CREATININE 1.28* 1.24  CALCIUM 8.8* 8.7*    CBC: Recent Labs  Lab 09/04/21 0401 09/07/21 0424  WBC 7.1 8.4  HGB  11.7* 11.4*  HCT 36.4* 35.4*  MCV 91.2 90.8  PLT 224 233    Studies: No results found.  Principal Problem:   Discitis of thoracic region Active Problems:   Essential hypertension   Stage 3a chronic kidney disease (HCC)   Type 2 diabetes mellitus with diabetic polyneuropathy, with long-term current use of insulin (HCC)   Right below-knee amputee (HCC)   Diabetic infection of left foot (HCC)   Chronic diastolic (congestive) heart failure (HCC)   Severe sepsis with acute organ dysfunction (HCC)   Chest pain   AKI (acute kidney injury) ([place])   Gas gangrene of foot ([place])   Atrial flutter ([place])   Septic shock ([place])   Systolic dysfunction   Pressure injury of skin   Bacteremia due to group B Streptococcus     Bebe Moncure Tublu Jaiana Sheffer, [facility]  If 7PM-7AM, please contact night-coverage [email or web address]   LOS: 49 days

## 2021-09-09 NOTE — TOC Progression Note (Signed)
Transition of Care [facility]) - Progression Note    Patient Details  Name: [name] MRN: [id number] Date of Birth: Dec 28, 1965  Transition of Care [facility]) CM/SW Contact  Emeterio Reeve, [place] Phone Number: 09/09/2021, 12:12 PM  Clinical Narrative:     Insurance Josem Kaufmann is still pending. CSW will follow for updates.   Expected Discharge Plan: [place] Barriers to Discharge: Continued Medical Work up  Expected Discharge Plan and Services Expected Discharge Plan: [place] In-house Referral: Clinical Social Work Discharge Planning Services: CM Consult, Follow-up appt scheduled Post Acute Care Choice: [place] arrangements for the past 2 months: Apartment                                       Social Determinants of Health (SDOH) Interventions    Readmission Risk Interventions No flowsheet data found.

## 2021-09-10 DIAGNOSIS — G894 Chronic pain syndrome: Secondary | ICD-10-CM | POA: Diagnosis not present

## 2021-09-10 DIAGNOSIS — E1142 Type 2 diabetes mellitus with diabetic polyneuropathy: Secondary | ICD-10-CM | POA: Diagnosis not present

## 2021-09-10 DIAGNOSIS — I5020 Unspecified systolic (congestive) heart failure: Secondary | ICD-10-CM | POA: Diagnosis not present

## 2021-09-10 DIAGNOSIS — B951 Streptococcus, group B, as the cause of diseases classified elsewhere: Secondary | ICD-10-CM | POA: Diagnosis not present

## 2021-09-10 DIAGNOSIS — D649 Anemia, unspecified: Secondary | ICD-10-CM | POA: Diagnosis not present

## 2021-09-10 DIAGNOSIS — Z20822 Contact with and (suspected) exposure to covid-19: Secondary | ICD-10-CM | POA: Diagnosis not present

## 2021-09-10 DIAGNOSIS — G8929 Other chronic pain: Secondary | ICD-10-CM | POA: Diagnosis not present

## 2021-09-10 DIAGNOSIS — D638 Anemia in other chronic diseases classified elsewhere: Secondary | ICD-10-CM | POA: Diagnosis not present

## 2021-09-10 DIAGNOSIS — Z89511 Acquired absence of right leg below knee: Secondary | ICD-10-CM | POA: Diagnosis not present

## 2021-09-10 DIAGNOSIS — I10 Essential (primary) hypertension: Secondary | ICD-10-CM | POA: Diagnosis not present

## 2021-09-10 DIAGNOSIS — M549 Dorsalgia, unspecified: Secondary | ICD-10-CM | POA: Diagnosis not present

## 2021-09-10 DIAGNOSIS — R739 Hyperglycemia, unspecified: Secondary | ICD-10-CM | POA: Diagnosis not present

## 2021-09-10 DIAGNOSIS — I4892 Unspecified atrial flutter: Secondary | ICD-10-CM | POA: Diagnosis not present

## 2021-09-10 DIAGNOSIS — I130 Hypertensive heart and chronic kidney disease with heart failure and stage 1 through stage 4 chronic kidney disease, or unspecified chronic kidney disease: Secondary | ICD-10-CM | POA: Diagnosis not present

## 2021-09-10 DIAGNOSIS — L89304 Pressure ulcer of unspecified buttock, stage 4: Secondary | ICD-10-CM | POA: Diagnosis not present

## 2021-09-10 DIAGNOSIS — M6281 Muscle weakness (generalized): Secondary | ICD-10-CM | POA: Diagnosis not present

## 2021-09-10 DIAGNOSIS — R7881 Bacteremia: Secondary | ICD-10-CM | POA: Diagnosis not present

## 2021-09-10 DIAGNOSIS — Z4781 Encounter for orthopedic aftercare following surgical amputation: Secondary | ICD-10-CM | POA: Diagnosis not present

## 2021-09-10 DIAGNOSIS — E1169 Type 2 diabetes mellitus with other specified complication: Secondary | ICD-10-CM | POA: Diagnosis present

## 2021-09-10 DIAGNOSIS — G8220 Paraplegia, unspecified: Secondary | ICD-10-CM | POA: Diagnosis not present

## 2021-09-10 DIAGNOSIS — Z23 Encounter for immunization: Secondary | ICD-10-CM | POA: Diagnosis not present

## 2021-09-10 DIAGNOSIS — D72829 Elevated white blood cell count, unspecified: Secondary | ICD-10-CM | POA: Diagnosis not present

## 2021-09-10 DIAGNOSIS — E1165 Type 2 diabetes mellitus with hyperglycemia: Secondary | ICD-10-CM | POA: Diagnosis not present

## 2021-09-10 DIAGNOSIS — G061 Intraspinal abscess and granuloma: Secondary | ICD-10-CM | POA: Diagnosis not present

## 2021-09-10 DIAGNOSIS — L89154 Pressure ulcer of sacral region, stage 4: Secondary | ICD-10-CM | POA: Diagnosis not present

## 2021-09-10 DIAGNOSIS — E1122 Type 2 diabetes mellitus with diabetic chronic kidney disease: Secondary | ICD-10-CM | POA: Diagnosis present

## 2021-09-10 DIAGNOSIS — E871 Hypo-osmolality and hyponatremia: Secondary | ICD-10-CM | POA: Diagnosis not present

## 2021-09-10 DIAGNOSIS — E11649 Type 2 diabetes mellitus with hypoglycemia without coma: Secondary | ICD-10-CM | POA: Diagnosis not present

## 2021-09-10 DIAGNOSIS — I5042 Chronic combined systolic (congestive) and diastolic (congestive) heart failure: Secondary | ICD-10-CM | POA: Diagnosis not present

## 2021-09-10 DIAGNOSIS — L89300 Pressure ulcer of unspecified buttock, unstageable: Secondary | ICD-10-CM | POA: Diagnosis not present

## 2021-09-10 DIAGNOSIS — E291 Testicular hypofunction: Secondary | ICD-10-CM | POA: Diagnosis present

## 2021-09-10 DIAGNOSIS — M4644 Discitis, unspecified, thoracic region: Secondary | ICD-10-CM | POA: Diagnosis not present

## 2021-09-10 DIAGNOSIS — F1120 Opioid dependence, uncomplicated: Secondary | ICD-10-CM | POA: Diagnosis present

## 2021-09-10 DIAGNOSIS — N1831 Chronic kidney disease, stage 3a: Secondary | ICD-10-CM | POA: Diagnosis not present

## 2021-09-10 DIAGNOSIS — G062 Extradural and subdural abscess, unspecified: Secondary | ICD-10-CM | POA: Diagnosis not present

## 2021-09-10 DIAGNOSIS — F32A Depression, unspecified: Secondary | ICD-10-CM | POA: Diagnosis present

## 2021-09-10 DIAGNOSIS — G4700 Insomnia, unspecified: Secondary | ICD-10-CM | POA: Diagnosis present

## 2021-09-10 DIAGNOSIS — I5022 Chronic systolic (congestive) heart failure: Secondary | ICD-10-CM | POA: Diagnosis not present

## 2021-09-10 DIAGNOSIS — F40240 Claustrophobia: Secondary | ICD-10-CM | POA: Diagnosis present

## 2021-09-10 DIAGNOSIS — E785 Hyperlipidemia, unspecified: Secondary | ICD-10-CM | POA: Diagnosis present

## 2021-09-10 DIAGNOSIS — I480 Paroxysmal atrial fibrillation: Secondary | ICD-10-CM | POA: Diagnosis present

## 2021-09-10 DIAGNOSIS — M546 Pain in thoracic spine: Secondary | ICD-10-CM | POA: Diagnosis not present

## 2021-09-10 DIAGNOSIS — M4624 Osteomyelitis of vertebra, thoracic region: Secondary | ICD-10-CM | POA: Diagnosis not present

## 2021-09-10 NOTE — Progress Notes (Signed)
Patient picked up by [facility]. IV removed, belongings sent on stretcher. No complaints.

## 2021-09-11 DIAGNOSIS — M4644 Discitis, unspecified, thoracic region: Secondary | ICD-10-CM | POA: Diagnosis not present

## 2021-09-11 DIAGNOSIS — R7881 Bacteremia: Secondary | ICD-10-CM | POA: Diagnosis not present

## 2021-09-11 DIAGNOSIS — N1831 Chronic kidney disease, stage 3a: Secondary | ICD-10-CM | POA: Diagnosis not present

## 2021-09-11 DIAGNOSIS — G894 Chronic pain syndrome: Secondary | ICD-10-CM | POA: Diagnosis not present

## 2021-09-11 DIAGNOSIS — E1142 Type 2 diabetes mellitus with diabetic polyneuropathy: Secondary | ICD-10-CM | POA: Diagnosis not present

## 2021-09-12 ENCOUNTER — Inpatient Hospital Stay (HOSPITAL_COMMUNITY)
Admission: EM | Admit: 2021-09-12 | Discharge: 2021-09-19 | DRG: 592 | Disposition: A | Discharge status: Home Health | Payer: Medicare HMO | Source: Skilled Nursing Facility | Attending: Internal Medicine | Admitting: Internal Medicine

## 2021-09-12 DIAGNOSIS — E291 Testicular hypofunction: Secondary | ICD-10-CM | POA: Diagnosis present

## 2021-09-12 DIAGNOSIS — M546 Pain in thoracic spine: Secondary | ICD-10-CM | POA: Diagnosis not present

## 2021-09-12 DIAGNOSIS — Z89511 Acquired absence of right leg below knee: Secondary | ICD-10-CM | POA: Diagnosis not present

## 2021-09-12 DIAGNOSIS — R7881 Bacteremia: Secondary | ICD-10-CM | POA: Diagnosis not present

## 2021-09-12 DIAGNOSIS — M549 Dorsalgia, unspecified: Secondary | ICD-10-CM

## 2021-09-12 DIAGNOSIS — Z888 Allergy status to other drugs, medicaments and biological substances status: Secondary | ICD-10-CM

## 2021-09-12 DIAGNOSIS — M47816 Spondylosis without myelopathy or radiculopathy, lumbar region: Secondary | ICD-10-CM | POA: Diagnosis not present

## 2021-09-12 DIAGNOSIS — N1831 Chronic kidney disease, stage 3a: Secondary | ICD-10-CM | POA: Diagnosis not present

## 2021-09-12 DIAGNOSIS — E1122 Type 2 diabetes mellitus with diabetic chronic kidney disease: Secondary | ICD-10-CM | POA: Diagnosis present

## 2021-09-12 DIAGNOSIS — E871 Hypo-osmolality and hyponatremia: Secondary | ICD-10-CM | POA: Diagnosis present

## 2021-09-12 DIAGNOSIS — M4624 Osteomyelitis of vertebra, thoracic region: Secondary | ICD-10-CM | POA: Diagnosis present

## 2021-09-12 DIAGNOSIS — M2578 Osteophyte, vertebrae: Secondary | ICD-10-CM | POA: Diagnosis not present

## 2021-09-12 DIAGNOSIS — G4700 Insomnia, unspecified: Secondary | ICD-10-CM | POA: Diagnosis present

## 2021-09-12 DIAGNOSIS — I5042 Chronic combined systolic (congestive) and diastolic (congestive) heart failure: Secondary | ICD-10-CM | POA: Diagnosis not present

## 2021-09-12 DIAGNOSIS — D72829 Elevated white blood cell count, unspecified: Secondary | ICD-10-CM | POA: Diagnosis not present

## 2021-09-12 DIAGNOSIS — L8990 Pressure ulcer of unspecified site, unspecified stage: Secondary | ICD-10-CM | POA: Diagnosis present

## 2021-09-12 DIAGNOSIS — Z794 Long term (current) use of insulin: Secondary | ICD-10-CM

## 2021-09-12 DIAGNOSIS — E11649 Type 2 diabetes mellitus with hypoglycemia without coma: Secondary | ICD-10-CM | POA: Diagnosis not present

## 2021-09-12 DIAGNOSIS — L89304 Pressure ulcer of unspecified buttock, stage 4: Secondary | ICD-10-CM | POA: Diagnosis not present

## 2021-09-12 DIAGNOSIS — E1165 Type 2 diabetes mellitus with hyperglycemia: Secondary | ICD-10-CM | POA: Diagnosis not present

## 2021-09-12 DIAGNOSIS — F40240 Claustrophobia: Secondary | ICD-10-CM | POA: Diagnosis present

## 2021-09-12 DIAGNOSIS — F32A Depression, unspecified: Secondary | ICD-10-CM | POA: Diagnosis present

## 2021-09-12 DIAGNOSIS — B951 Streptococcus, group B, as the cause of diseases classified elsewhere: Secondary | ICD-10-CM | POA: Diagnosis not present

## 2021-09-12 DIAGNOSIS — Z20822 Contact with and (suspected) exposure to covid-19: Secondary | ICD-10-CM | POA: Diagnosis not present

## 2021-09-12 DIAGNOSIS — I10 Essential (primary) hypertension: Secondary | ICD-10-CM | POA: Diagnosis present

## 2021-09-12 DIAGNOSIS — M533 Sacrococcygeal disorders, not elsewhere classified: Secondary | ICD-10-CM | POA: Diagnosis present

## 2021-09-12 DIAGNOSIS — G062 Extradural and subdural abscess, unspecified: Secondary | ICD-10-CM | POA: Diagnosis present

## 2021-09-12 DIAGNOSIS — G8220 Paraplegia, unspecified: Secondary | ICD-10-CM | POA: Diagnosis not present

## 2021-09-12 DIAGNOSIS — F1120 Opioid dependence, uncomplicated: Secondary | ICD-10-CM | POA: Diagnosis present

## 2021-09-12 DIAGNOSIS — G8929 Other chronic pain: Secondary | ICD-10-CM | POA: Diagnosis not present

## 2021-09-12 DIAGNOSIS — E785 Hyperlipidemia, unspecified: Secondary | ICD-10-CM | POA: Diagnosis present

## 2021-09-12 DIAGNOSIS — R3911 Hesitancy of micturition: Secondary | ICD-10-CM | POA: Diagnosis present

## 2021-09-12 DIAGNOSIS — Z881 Allergy status to other antibiotic agents status: Secondary | ICD-10-CM

## 2021-09-12 DIAGNOSIS — I4892 Unspecified atrial flutter: Secondary | ICD-10-CM | POA: Diagnosis present

## 2021-09-12 DIAGNOSIS — R7982 Elevated C-reactive protein (CRP): Secondary | ICD-10-CM | POA: Diagnosis present

## 2021-09-12 DIAGNOSIS — M4644 Discitis, unspecified, thoracic region: Secondary | ICD-10-CM | POA: Diagnosis not present

## 2021-09-12 DIAGNOSIS — E119 Type 2 diabetes mellitus without complications: Secondary | ICD-10-CM | POA: Diagnosis not present

## 2021-09-12 DIAGNOSIS — Z8249 Family history of ischemic heart disease and other diseases of the circulatory system: Secondary | ICD-10-CM

## 2021-09-12 DIAGNOSIS — I5022 Chronic systolic (congestive) heart failure: Secondary | ICD-10-CM

## 2021-09-12 DIAGNOSIS — I130 Hypertensive heart and chronic kidney disease with heart failure and stage 1 through stage 4 chronic kidney disease, or unspecified chronic kidney disease: Secondary | ICD-10-CM | POA: Diagnosis present

## 2021-09-12 DIAGNOSIS — E1169 Type 2 diabetes mellitus with other specified complication: Secondary | ICD-10-CM | POA: Diagnosis present

## 2021-09-12 DIAGNOSIS — Z79899 Other long term (current) drug therapy: Secondary | ICD-10-CM

## 2021-09-12 DIAGNOSIS — Z89512 Acquired absence of left leg below knee: Secondary | ICD-10-CM

## 2021-09-12 DIAGNOSIS — I110 Hypertensive heart disease with heart failure: Secondary | ICD-10-CM | POA: Diagnosis not present

## 2021-09-12 DIAGNOSIS — L89153 Pressure ulcer of sacral region, stage 3: Secondary | ICD-10-CM | POA: Diagnosis not present

## 2021-09-12 DIAGNOSIS — D649 Anemia, unspecified: Secondary | ICD-10-CM | POA: Diagnosis present

## 2021-09-12 DIAGNOSIS — I480 Paroxysmal atrial fibrillation: Secondary | ICD-10-CM | POA: Diagnosis present

## 2021-09-12 DIAGNOSIS — L89154 Pressure ulcer of sacral region, stage 4: Principal | ICD-10-CM | POA: Diagnosis present

## 2021-09-12 DIAGNOSIS — Z79890 Hormone replacement therapy (postmenopausal): Secondary | ICD-10-CM

## 2021-09-12 DIAGNOSIS — G894 Chronic pain syndrome: Secondary | ICD-10-CM | POA: Diagnosis present

## 2021-09-12 DIAGNOSIS — I509 Heart failure, unspecified: Secondary | ICD-10-CM | POA: Diagnosis not present

## 2021-09-12 DIAGNOSIS — D638 Anemia in other chronic diseases classified elsewhere: Secondary | ICD-10-CM | POA: Diagnosis not present

## 2021-09-12 DIAGNOSIS — Z7901 Long term (current) use of anticoagulants: Secondary | ICD-10-CM

## 2021-09-12 DIAGNOSIS — L89300 Pressure ulcer of unspecified buttock, unstageable: Secondary | ICD-10-CM | POA: Diagnosis not present

## 2021-09-12 DIAGNOSIS — Z7401 Bed confinement status: Secondary | ICD-10-CM

## 2021-09-12 DIAGNOSIS — R739 Hyperglycemia, unspecified: Secondary | ICD-10-CM | POA: Diagnosis not present

## 2021-09-12 HISTORY — DX: Depression, unspecified: F32.A

## 2021-09-12 HISTORY — DX: Other complications of anesthesia, initial encounter: T88.59XA

## 2021-09-12 MED ORDER — HYDROMORPHONE HCL 2 MG PO TABS: 2.0000 mg | ORAL_TABLET | Freq: Once | ORAL | Status: DC

## 2021-09-12 MED ORDER — HYDROMORPHONE HCL 1 MG/ML IJ SOLN
0.5000 mg | Freq: Once | INTRAMUSCULAR | Status: AC
  Administered 2021-09-12: 0.5 mg via INTRAMUSCULAR
  Filled 2021-09-12: qty 1

## 2021-09-12 NOTE — ED Provider Triage Note (Signed)
Emergency Medicine Provider Triage Evaluation Note  [name] , a 56 y.o. male  was evaluated in triage.  Pt complains of lack of pain control.  He was discharged to a nursing facility 2 days ago from hospital stay.  He was not given any oral antibiotics or pain medicine the day he was discharged there but they started this again last night.  He does not feel that his pain is adequately controlled as they had not been giving him his OxyContin or oral Dilaudid.  He reports back pain but he states that this is related to his osteomyelitis.  Review of Systems  Positive: Back pain Negative: Chest pain, fever  Physical Exam  BP (!) 152/78 (BP Location: Left Arm)    Pulse 78    Temp 98.5 F (36.9 C) (Oral)    Resp 18    SpO2 97%  Gen:   Awake, no distress   Resp:  Normal effort  MSK:   Moves extremities without difficulty  Other:  Bilateral BKA's  Medical Decision Making  Medically screening exam initiated at 12:03 PM.  Appropriate orders placed.  [name] was informed that the remainder of the evaluation will be completed by another provider, this initial triage assessment does not replace that evaluation, and the importance of remaining in the ED until their evaluation is complete.  Will give pain control   Delia Heady, PA-C 09/12/21 1205

## 2021-09-12 NOTE — ED Notes (Signed)
Pt states he has "unstagable" bed sore and has had no antibiotics for the past two days. Inquiring about when he will get a room. Charge RN notified and states soon.

## 2021-09-12 NOTE — ED Provider Notes (Signed)
[facility] EMERGENCY DEPARTMENT Provider Note   CSN: [id number] Arrival date & time: 09/12/21  1118     History  Chief Complaint  Patient presents with   Back Pain    [name] is a 56 y.o. male.   Back Pain  56 year old male with a history of DM2, prior right BKA, CHF, HTN, HLD, recent hospitalization for left diabetic foot resulting in group B strep bacteremia and septic shock.  He underwent transtibial amputation of the left on 11/6.  He developed a complicated hospital course with thoracic discitis/osteomyelitis/epidural abscess with a sacral decubitus ulcer.  He was discharged to a SNF on 09/09/2021 with a plan for outpatient follow-up with neurosurgery 2 to 3 weeks post discharge.  Per review of documentation, he was "reevaluated by neurosurgery and patient's request on 12/27 with recommendations to continue supportive care.  Last MRI in 12/16 and showed improvement.  Needs to follow-up with Dr. Shanon Brow Jones-neurosurgery in 2 to 3 weeks postdischarge."   The patient presents from his skilled nursing facility with worsening midthoracic back pain.  He states that his pain control was poor while at his SNF.  He refuses to go back there.  He also states that he missed a days worth of his outpatient antibiotics.  Home Medications Prior to Admission medications   Medication Sig Start Date End Date Taking? Authorizing Provider  amiodarone (PACERONE) 200 MG tablet Take 1 tablet (200 mg total) by mouth daily. 09/10/21 10/10/21 Yes Vashti Hey, MD  apixaban (ELIQUIS) 5 MG TABS tablet Take 1 tablet (5 mg total) by mouth 2 (two) times daily. 09/09/21 10/09/21 Yes Vashti Hey, MD  cefadroxil (DURICEF) 500 MG capsule Take 2 capsules (1,000 mg total) by mouth 2 (two) times daily for 44 doses. Patient taking differently: Take 1,000 mg by mouth See admin instructions. Bid x 44 doses 09/09/21 10/01/21 Yes Chatterjee, Kyra Searles, MD  Ensure Plus (ENSURE PLUS)  LIQD Take 237 mLs by mouth 2 (two) times daily between meals.   Yes [provider]  finasteride (PROSCAR) 5 MG tablet Take 1 tablet (5 mg total) by mouth daily. 09/10/21 10/10/21 Yes Vashti Hey, MD  gabapentin (NEURONTIN) 400 MG capsule Take 1 capsule (400 mg total) by mouth 3 (three) times daily. 09/09/21 10/09/21 Yes Bonnell Public Tublu, MD  insulin aspart (NOVOLOG) 100 UNIT/ML injection Inject 6 Units into the skin 3 (three) times daily with meals. 09/09/21  Yes Bonnell Public Tublu, MD  insulin detemir (LEVEMIR) 100 UNIT/ML injection Inject 0.36 mLs (36 Units total) into the skin 2 (two) times daily. 09/09/21  Yes Vashti Hey, MD  Insulin Glargine (LANTUS SOLOSTAR [place]) Inject 36 Units into the skin in the morning and at bedtime.   Yes [provider]  liver oil-zinc oxide (DESITIN) 40 % ointment Apply topically daily as needed for irritation. Patient taking differently: Apply 1 application topically every 6 (six) hours as needed for irritation. 09/09/21  Yes Vashti Hey, MD  magnesium hydroxide (MILK OF MAGNESIA) 400 MG/5ML suspension Take 30 mLs by mouth daily as needed for mild constipation.   Yes [provider]  methocarbamol 1000 MG TABS Take 1,000 mg by mouth 3 (three) times daily. 09/09/21 10/09/21 Yes Vashti Hey, MD  metoprolol succinate (TOPROL-XL) 25 MG 24 hr tablet Take 1 tablet (25 mg total) by mouth daily. 09/10/21 10/10/21 Yes Vashti Hey, MD  Multiple Vitamin (MULTIVITAMIN WITH MINERALS) TABS tablet Take 1 tablet by mouth daily. 09/10/21  10/10/21 Yes Vashti Hey, MD  Oxycodone HCl 10 MG TABS Take 10 mg by mouth every 8 (eight) hours as needed (chronic pain).   Yes [provider]  sodium hypochlorite (DAKIN'S 1/4 STRENGTH) 0.125 % SOLN Irrigate with as directed daily for 10 days. Patient taking differently: Irrigate with 1 application as directed daily. 09/10/21 09/20/21 Yes  Vashti Hey, MD  tamsulosin (FLOMAX) 0.4 MG CAPS capsule Take 1 capsule (0.4 mg total) by mouth daily. 09/10/21 10/10/21 Yes Vashti Hey, MD  testosterone (ANDROGEL) 50 MG/5GM (1%) GEL Place 5 g onto the skin daily. 09/10/21 10/10/21 Yes Vashti Hey, MD  testosterone (ANDROGEL) 50 MG/5GM (1%) GEL Place 5 g onto the skin daily as needed (hypogonadism).   Yes [provider]  traZODone (DESYREL) 150 MG tablet Take 1 tablet (150 mg total) by mouth at bedtime as needed for up to 15 days for sleep. 09/09/21 09/24/21 Yes Vashti Hey, MD  Chlorhexidine Gluconate Cloth 2 % PADS Apply 6 each topically daily for 10 days. Patient not taking: Reported on 09/13/2021 09/10/21 09/20/21  Vashti Hey, MD  collagenase (SANTYL) ointment Apply topically daily. Patient not taking: Reported on 09/13/2021 09/10/21   Vashti Hey, MD  Ensure Max Protein (ENSURE MAX PROTEIN) LIQD Take 330 mLs (11 oz total) by mouth 2 (two) times daily between meals for 10 days. Patient not taking: Reported on 09/13/2021 09/09/21 09/19/21  Vashti Hey, MD  HYDROcodone-acetaminophen (NORCO/VICODIN) 5-325 MG tablet Take 1 tablet by mouth once. Patient not taking: Reported on 09/13/2021    [provider]  nystatin cream (MYCOSTATIN) Apply topically 2 (two) times daily. Patient not taking: Reported on 09/13/2021 09/09/21   Vashti Hey, MD  oxyCODONE-acetaminophen (PERCOCET/ROXICET) 5-325 MG tablet Take 2 tablets by mouth once. Patient not taking: Reported on 09/13/2021    [provider]      Allergies    Bactrim [sulfamethoxazole-trimethoprim], Ceprotin [protein c concentrate (human)], Ciprofloxacin, and Levaquin [levofloxacin]    Review of Systems   Review of Systems  Musculoskeletal:  Positive for back pain.   Physical Exam Updated Vital Signs BP (!) 141/87 (BP Location: Left Arm)    Pulse 87    Temp 98.7 F (37.1 C)    Resp  20    SpO2 100%  Physical Exam Vitals and nursing note reviewed. Exam conducted with a chaperone present.  Constitutional:      General: He is not in acute distress. HENT:     Head: Normocephalic and atraumatic.  Eyes:     Conjunctiva/sclera: Conjunctivae normal.     Pupils: Pupils are equal, round, and reactive to light.  Cardiovascular:     Rate and Rhythm: Normal rate and regular rhythm.  Pulmonary:     Effort: Pulmonary effort is normal. No respiratory distress.     Breath sounds: Normal breath sounds.  Abdominal:     General: There is no distension.     Tenderness: There is no guarding.  Genitourinary:    Comments: Bruising noted to the glans of the penis Musculoskeletal:        General: Tenderness present. No deformity or signs of injury.     Cervical back: Neck supple.     Comments: Large likely stage IV sacral decubitus ulcer present, no significant drainage or surrounding erythema.  Mid thoracic spinal tenderness to palpation  Skin:    Findings: No lesion or rash.  Neurological:     Mental Status: He is alert.  Mental status is at baseline.     Comments: Patient with lower hemibody paresis.    ED Results / Procedures / Treatments   Labs (all labs ordered are listed, but only abnormal results are displayed) Labs Reviewed  CBC WITH DIFFERENTIAL/PLATELET - Abnormal; Notable for the following components:      Result Value   WBC 11.7 (*)    Hemoglobin 12.7 (*)    HCT 38.4 (*)    Neutro Abs 10.1 (*)    Lymphs Abs 0.5 (*)    Abs Immature Granulocytes 0.14 (*)    All other components within normal limits  BASIC METABOLIC PANEL - Abnormal; Notable for the following components:   Sodium 130 (*)    Chloride 97 (*)    Glucose, Bld 264 (*)    Calcium 8.8 (*)    All other components within normal limits  SEDIMENTATION RATE - Abnormal; Notable for the following components:   Sed Rate 81 (*)    All other components within normal limits  C-REACTIVE PROTEIN - Abnormal;  Notable for the following components:   CRP 10.4 (*)    All other components within normal limits  CBG MONITORING, ED - Abnormal; Notable for the following components:   Glucose-Capillary 263 (*)    All other components within normal limits  CBG MONITORING, ED - Abnormal; Notable for the following components:   Glucose-Capillary 224 (*)    All other components within normal limits  RESP PANEL BY RT-PCR (FLU A&B, COVID) ARPGX2  SURGICAL PCR SCREEN  NO BLOOD PRODUCTS    EKG None  Radiology No results found.  Procedures Procedures    Medications Ordered in ED Medications  LORazepam (ATIVAN) tablet 0.5 mg ( Oral MAR Hold 09/13/21 1608)  LORazepam (ATIVAN) injection 0.5 mg ( Intravenous MAR Hold 09/13/21 1608)  apixaban (ELIQUIS) tablet 5 mg ( Oral Automatically Held 09/21/21 2200)  insulin aspart (novoLOG) injection 6 Units ( Subcutaneous Automatically Held 09/21/21 1700)  insulin detemir (LEVEMIR) injection 36 Units ( Subcutaneous Automatically Held 09/21/21 2200)  metoprolol succinate (TOPROL-XL) 24 hr tablet 25 mg ( Oral Automatically Held 09/21/21 1000)  traZODone (DESYREL) tablet 150 mg ( Oral MAR Hold 09/13/21 1608)  cefadroxil (DURICEF) capsule 1,000 mg ( Oral Automatically Held 09/21/21 2200)  HYDROmorphone (DILAUDID) injection 1 mg ( Intravenous MAR Hold 09/13/21 1608)  lactated ringers infusion ( Intravenous Anesthesia Volume Adjustment 09/13/21 1608)  oxyCODONE (Oxy IR/ROXICODONE) immediate release tablet 5 mg (has no administration in time range)    Or  oxyCODONE (ROXICODONE) 5 MG/5ML solution 5 mg (has no administration in time range)  fentaNYL (SUBLIMAZE) injection 25-50 mcg (has no administration in time range)  promethazine (PHENERGAN) injection 6.25-12.5 mg (has no administration in time range)  amisulpride (BARHEMSYS) injection 10 mg (has no administration in time range)  acetaminophen (OFIRMEV) IV 1,000 mg (has no administration in time range)  HYDROmorphone (DILAUDID)  injection 0.5 mg (0.5 mg Intramuscular Given 09/12/21 1208)  LORazepam (ATIVAN) injection 1 mg (1 mg Intravenous Given 09/13/21 0144)  HYDROmorphone (DILAUDID) injection 1 mg (1 mg Intravenous Given 09/13/21 0042)  HYDROmorphone (DILAUDID) injection 1 mg (1 mg Intravenous Given 09/13/21 0514)  chlorhexidine (PERIDEX) 0.12 % solution 15 mL (15 mLs Mouth/Throat Given 09/13/21 1248)  gadobutrol (GADAVIST) 1 MMOL/ML injection 10 mL (10 mLs Intravenous Contrast Given 09/13/21 1551)    ED Course/ Medical Decision Making/ A&P  Medical Decision Making  56 year old male with a history of DM2, prior right BKA, CHF, HTN, HLD, recent hospitalization for left diabetic foot resulting in group B strep bacteremia and septic shock.  He underwent transtibial amputation of the left on 11/6.  He developed a complicated hospital course with thoracic discitis/osteomyelitis/epidural abscess with a sacral decubitus ulcer.  He was discharged to a SNF on 09/09/2021 with a plan for outpatient follow-up with neurosurgery 2 to 3 weeks post discharge.  Per review of documentation, he was "reevaluated by neurosurgery and patient's request on 12/27 with recommendations to continue supportive care.  Last MRI in 12/16 and showed improvement.  Needs to follow-up with Dr. Shanon Brow Jones-neurosurgery in 2 to 3 weeks postdischarge."   The patient presents from his skilled nursing facility with worsening midthoracic back pain.  He states that his pain control was poor while at his SNF.  He refuses to go back there.  He also states that he missed a days worth of his outpatient antibiotics.  On arrival, the patient was afebrile, hemodynamically stable, mildly hypertensive BP 150/78, saturating well on room air.  Cardiac telemetry with normal sinus rhythm noted.  The patient presents with a social concern of dissatisfaction with his skilled nursing facility.  Additional concern for worsening midthoracic back pain in the setting of  osteomyelitis and prior epidural abscess.  It is difficult to assess whether the patient is having new symptoms associated with his epidural abscess beyond this because he already has paresis of his lower extremities.  I discussed the risks and benefits of MRI imaging with the patient.  I evaluated the patient's chart and he has had multiple MRIs in the past that required general anesthesia to accomplish.  The patient states that he is concerned for worsening infection in his back and is requesting MRI imaging.  I feel that this is appropriate given the significantly worsening pain, gap in antibiotic coverage outpatient.  Screening laboratory work-up was initiated to include COVID-19 and influenza PCR which resulted negative, CBC with leukocytosis to 11.7, CRP elevated to 10.4, ESR elevated to 81, BMP with hyperglycemia to 264.  Patient endorses worsening back pain in the setting of known prior epuidural abscess that had been managed conservatively without operative treatment with IV antibiotics. He states that he missed a day of his antibiotic medication while at his facility. Potential concern for interval development of worsening epidural abscess. MRI thoracic and lumbar spine ordered for further evaluation.   The patient states that he will likely be unable to obtain MRI given his severe back pain and inability to lie flat. On chart review, he previously had required general anesthesia for MRI imaging. Anesthesia paged. I was subsequently informed by anesthesia that the patient, who had previously been NPO, consumed a [employer] cup just prior to their evaluation around 0400. The patient was instructed to maintain NPO status by nursing. The patient will need to wait for MRI imaging intil 8 hours after his recent consumption of food per anesthesia. I did page hospitalaist medicine for admission which was declined as they had wanted to wait until MRI results were available. He continues to refuse to go  back to his facility, for which a social work consult was previously placed. His pain was addressed with IV and oral medications in the emergency department. Plan at time of signout to follow-up MRI imaging of the spine, follow-up social work plans for ultimate placement. Signout given to Dr Langston Masker at 0700.    Final  Clinical Impression(s) / ED Diagnoses Final diagnoses:  Back pain    Rx / DC Orders ED Discharge Orders     None         Regan Lemming, MD 09/13/21 [phone]

## 2021-09-12 NOTE — ED Triage Notes (Addendum)
Pt here via EMS from [facility] d/t back pain. Pt stating he is not receiving his meds.  164/94 Hr 90 CBg 311

## 2021-09-12 NOTE — ED Notes (Signed)
Pt here needing pain manage and states facility is not taking care of him with dignity.

## 2021-09-12 NOTE — ED Notes (Signed)
Pt transported to restroom due to BM. Upon removal of blankets it was revealed that foley cath had been pulled out while balloon was inflated. Small volume of blood found. On pts posterior, wound dressing from outside facility was noted to be no longer intact. Pt was cleaned and dressed. Triage RN Kathlee Nations notified of findings

## 2021-09-13 ENCOUNTER — Encounter (HOSPITAL_COMMUNITY): Payer: Self-pay

## 2021-09-13 ENCOUNTER — Encounter (HOSPITAL_COMMUNITY)
Admission: EM | Disposition: A | Discharge status: Home Health | Payer: Self-pay | Source: Skilled Nursing Facility | Attending: Internal Medicine

## 2021-09-13 ENCOUNTER — Emergency Department (HOSPITAL_COMMUNITY): Payer: Medicare HMO

## 2021-09-13 ENCOUNTER — Other Ambulatory Visit: Payer: Self-pay

## 2021-09-13 ENCOUNTER — Observation Stay (HOSPITAL_COMMUNITY): Payer: Medicare HMO

## 2021-09-13 ENCOUNTER — Observation Stay (HOSPITAL_COMMUNITY): Payer: Medicare HMO | Admitting: Anesthesiology

## 2021-09-13 DIAGNOSIS — M549 Dorsalgia, unspecified: Secondary | ICD-10-CM | POA: Diagnosis present

## 2021-09-13 DIAGNOSIS — M546 Pain in thoracic spine: Secondary | ICD-10-CM | POA: Diagnosis not present

## 2021-09-13 DIAGNOSIS — D72829 Elevated white blood cell count, unspecified: Secondary | ICD-10-CM

## 2021-09-13 DIAGNOSIS — M4644 Discitis, unspecified, thoracic region: Secondary | ICD-10-CM | POA: Diagnosis not present

## 2021-09-13 DIAGNOSIS — D649 Anemia, unspecified: Secondary | ICD-10-CM

## 2021-09-13 DIAGNOSIS — L89304 Pressure ulcer of unspecified buttock, stage 4: Secondary | ICD-10-CM

## 2021-09-13 DIAGNOSIS — E871 Hypo-osmolality and hyponatremia: Secondary | ICD-10-CM

## 2021-09-13 DIAGNOSIS — M2578 Osteophyte, vertebrae: Secondary | ICD-10-CM | POA: Diagnosis not present

## 2021-09-13 DIAGNOSIS — I10 Essential (primary) hypertension: Secondary | ICD-10-CM

## 2021-09-13 DIAGNOSIS — I4892 Unspecified atrial flutter: Secondary | ICD-10-CM | POA: Diagnosis not present

## 2021-09-13 DIAGNOSIS — R7881 Bacteremia: Secondary | ICD-10-CM | POA: Diagnosis not present

## 2021-09-13 DIAGNOSIS — M47816 Spondylosis without myelopathy or radiculopathy, lumbar region: Secondary | ICD-10-CM | POA: Diagnosis not present

## 2021-09-13 DIAGNOSIS — M533 Sacrococcygeal disorders, not elsewhere classified: Secondary | ICD-10-CM | POA: Diagnosis present

## 2021-09-13 DIAGNOSIS — Z89511 Acquired absence of right leg below knee: Secondary | ICD-10-CM

## 2021-09-13 HISTORY — DX: Elevated white blood cell count, unspecified: D72.829

## 2021-09-13 HISTORY — PX: RADIOLOGY WITH ANESTHESIA: SHX6223

## 2021-09-13 LAB — BASIC METABOLIC PANEL
Anion gap: 11 (ref 5–15)
BUN: 14 mg/dL (ref 6–20)
CO2: 22 mmol/L (ref 22–32)
Calcium: 8.8 mg/dL — ABNORMAL LOW (ref 8.9–10.3)
Chloride: 97 mmol/L — ABNORMAL LOW (ref 98–111)
Creatinine, Ser: 1.09 mg/dL (ref 0.61–1.24)
GFR, Estimated: >60 mL/min (ref >60)
Glucose, Bld: 264 mg/dL — ABNORMAL HIGH (ref 70–99)
Potassium: 4 mmol/L (ref 3.5–5.1)
Sodium: 130 mmol/L — ABNORMAL LOW (ref 135–145)

## 2021-09-13 LAB — GLUCOSE, CAPILLARY
Glucose-Capillary: 64 mg/dL — ABNORMAL LOW (ref 70–99)
Glucose-Capillary: 112 mg/dL — ABNORMAL HIGH (ref 70–99)
Glucose-Capillary: 388 mg/dL — ABNORMAL HIGH (ref 70–99)

## 2021-09-13 LAB — CBC WITH DIFFERENTIAL/PLATELET
Abs Immature Granulocytes: 0.14 10*3/uL — ABNORMAL HIGH (ref 0.00–0.07)
Basophils Absolute: 0 10*3/uL (ref 0.0–0.1)
Basophils Relative: 0 %
Eosinophils Absolute: 0.1 10*3/uL (ref 0.0–0.5)
Eosinophils Relative: 1 %
HCT: 38.4 % — ABNORMAL LOW (ref 39.0–52.0)
Hemoglobin: 12.7 g/dL — ABNORMAL LOW (ref 13.0–17.0)
Immature Granulocytes: 1 %
Lymphocytes Relative: 4 %
Lymphs Abs: 0.5 10*3/uL — ABNORMAL LOW (ref 0.7–4.0)
MCH: 29.3 pg (ref 26.0–34.0)
MCHC: 33.1 g/dL (ref 30.0–36.0)
MCV: 88.7 fL (ref 80.0–100.0)
Monocytes Absolute: 0.8 10*3/uL (ref 0.1–1.0)
Monocytes Relative: 7 %
Neutro Abs: 10.1 10*3/uL — ABNORMAL HIGH (ref 1.7–7.7)
Neutrophils Relative %: 87 %
Platelets: 313 10*3/uL (ref 150–400)
RBC: 4.33 MIL/uL (ref 4.22–5.81)
RDW: 13.7 % (ref 11.5–15.5)
WBC: 11.7 10*3/uL — ABNORMAL HIGH (ref 4.0–10.5)
nRBC: 0 % (ref 0.0–0.2)

## 2021-09-13 LAB — SURGICAL PCR SCREEN
MRSA, PCR: NEGATIVE
Staphylococcus aureus: NEGATIVE

## 2021-09-13 LAB — C-REACTIVE PROTEIN: CRP: 10.4 mg/dL — ABNORMAL HIGH (ref <1.0)

## 2021-09-13 LAB — RESP PANEL BY RT-PCR (FLU A&B, COVID) ARPGX2
Influenza A by PCR: NEGATIVE
Influenza B by PCR: NEGATIVE
SARS Coronavirus 2 by RT PCR: NEGATIVE

## 2021-09-13 LAB — SEDIMENTATION RATE: Sed Rate: 81 mm/hr — ABNORMAL HIGH (ref 0–16)

## 2021-09-13 LAB — NO BLOOD PRODUCTS

## 2021-09-13 LAB — CBG MONITORING, ED
Glucose-Capillary: 263 mg/dL — ABNORMAL HIGH (ref 70–99)
Glucose-Capillary: 224 mg/dL — ABNORMAL HIGH (ref 70–99)
Glucose-Capillary: 115 mg/dL — ABNORMAL HIGH (ref 70–99)

## 2021-09-13 IMAGING — MR MR LUMBAR SPINE WO/W CM
4 of 7 series · 22 of 48 positions shown · IV contrast (Gadavist)
Comparison: MRI thoracic spine dated [DATE]. MRI
thoracic and lumbar spine dated [DATE].

CLINICAL DATA: Thoracic osteomyelitis discitis.

EXAM:
MRI THORACIC AND LUMBAR SPINE WITHOUT AND WITH CONTRAST
TECHNIQUE: Multiplanar and multiecho pulse sequences of the thoracic and lumbar
spine were obtained without and with intravenous contrast.
CONTRAST:  10mL GADAVIST GADOBUTROL 1 MMOL/ML IV SOLN

[Series 25: T2 · sagittal · 4.0mm · 0.73mm/px · 5 of 17 slices shown (1 of 2)]
[im 1/17]
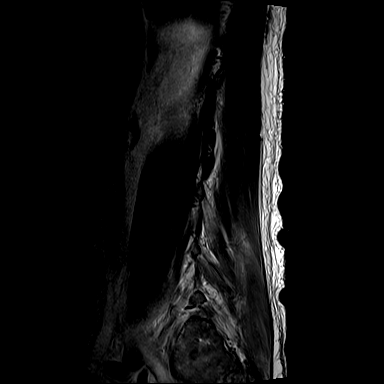
[im 5/17]
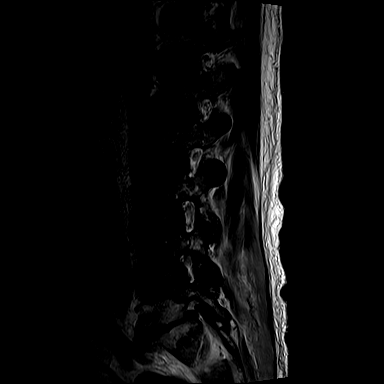
[im 9/17]
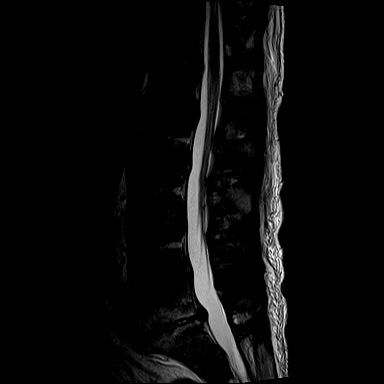
[im 13/17]
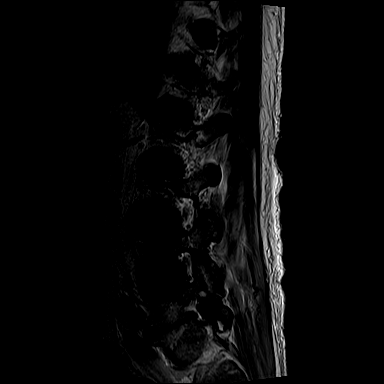
[im 17/17]
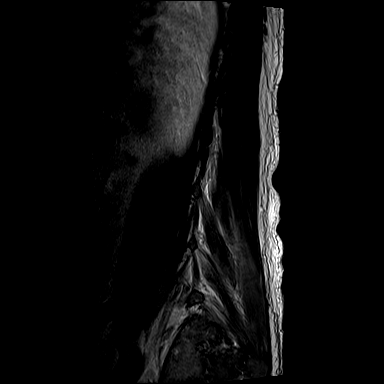

[Series 27: T1 · sagittal · 4.0mm · 0.88mm/px · 4 of 17 slices shown (1 of 2)]
[im 1/17]
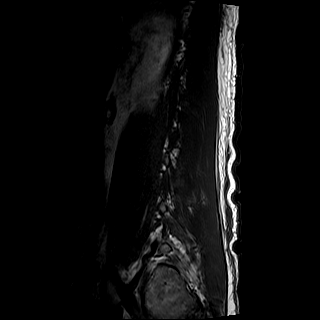
[im 6/17]
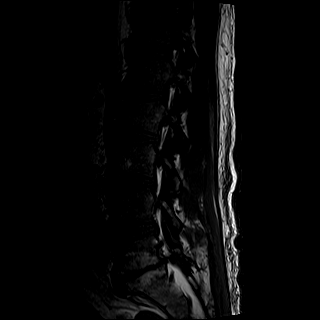
[im 11/17]
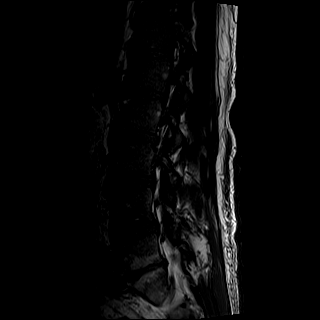
[im 17/17]
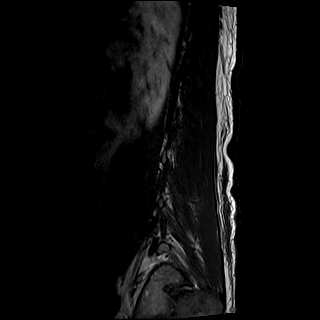

[Series 28: T2 · axial · 4.0mm · 0.57mm/px · z∈[-467,-238]mm · 8 of 38 slices shown (2 of 2)]
[im 1/38]
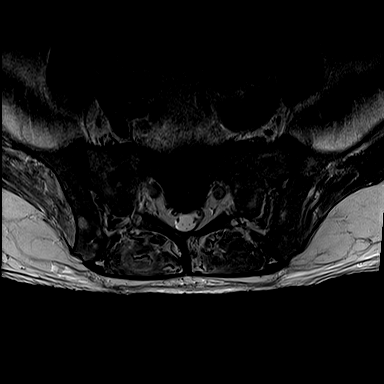
[im 5/38]
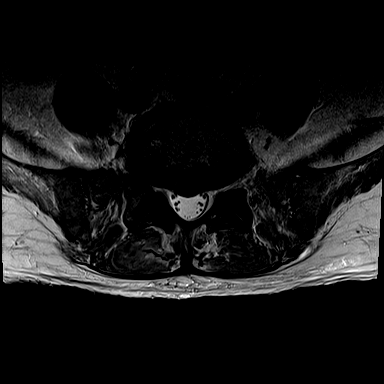
[im 13/38]
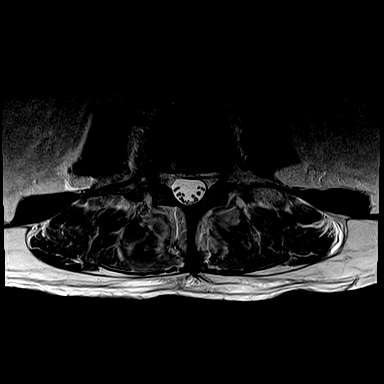
[im 17/38]
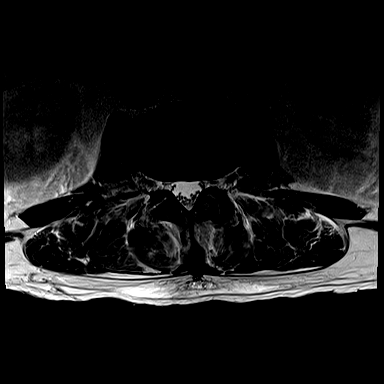
[im 21/38]
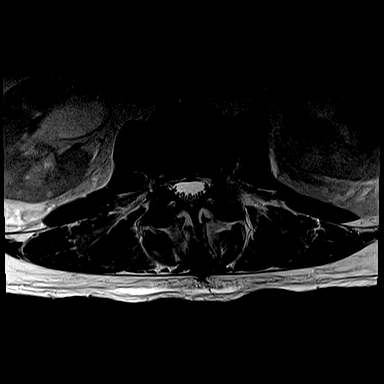
[im 25/38]
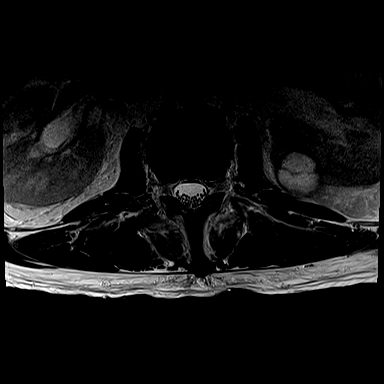
[im 33/38]
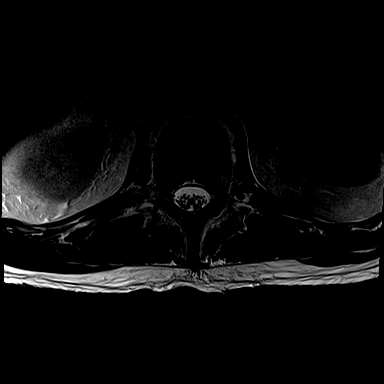
[im 38/38]
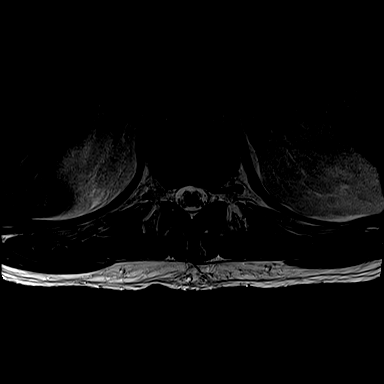

[Series 29: T1 · axial · 4.0mm · 0.34mm/px · z∈[-467,-263]mm · 5 of 38 slices shown (2 of 2)]
[im 1/38]
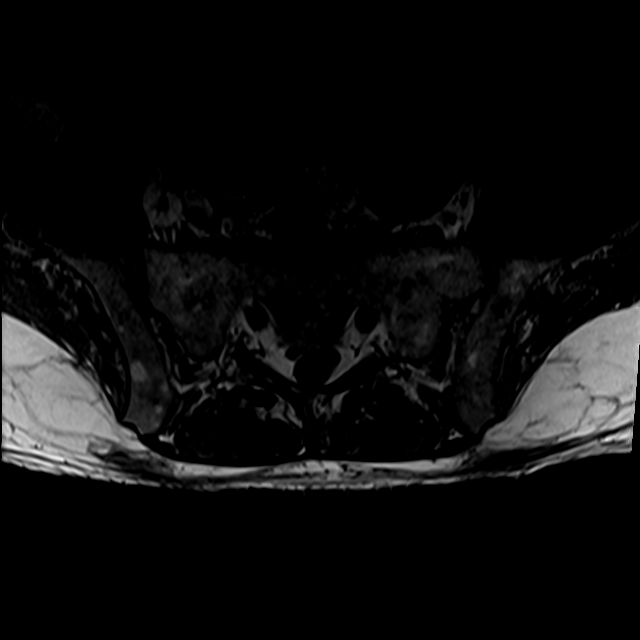
[im 5/38]
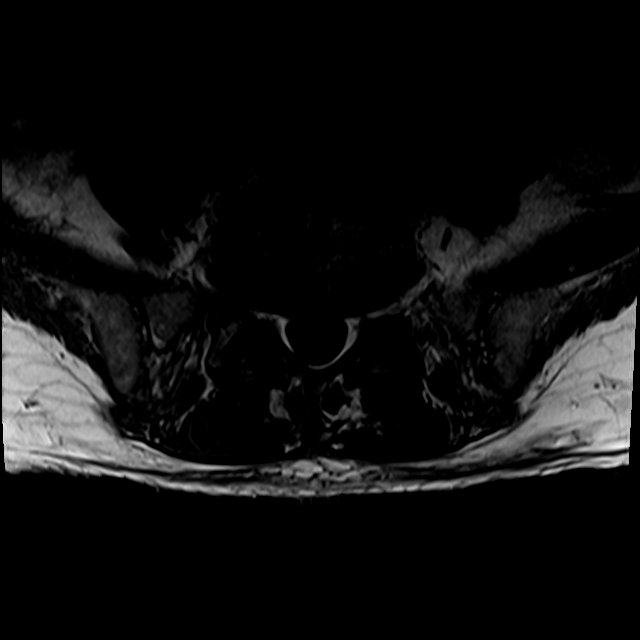
[im 13/38]
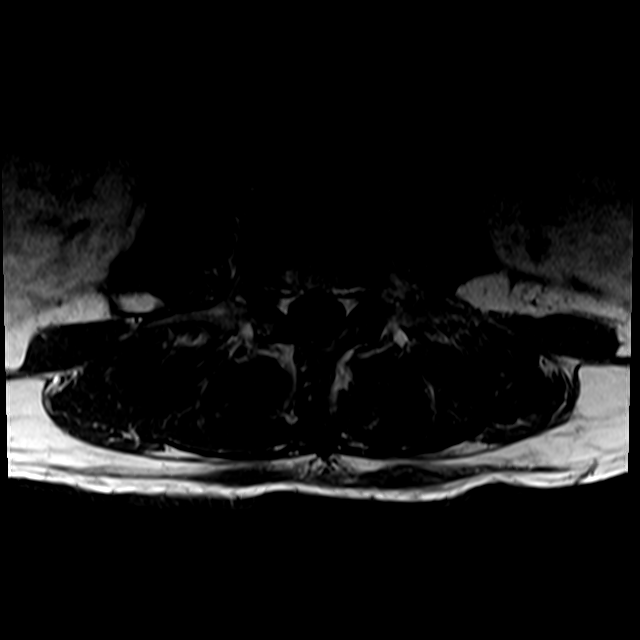
[im 21/38]
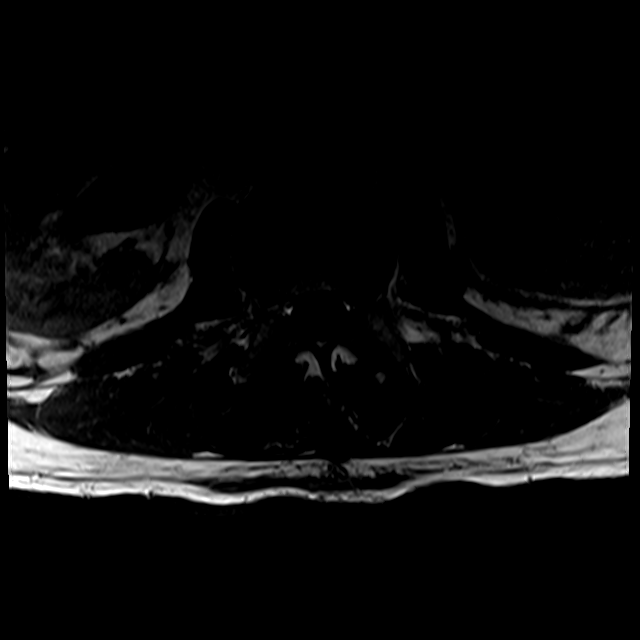
[im 33/38]
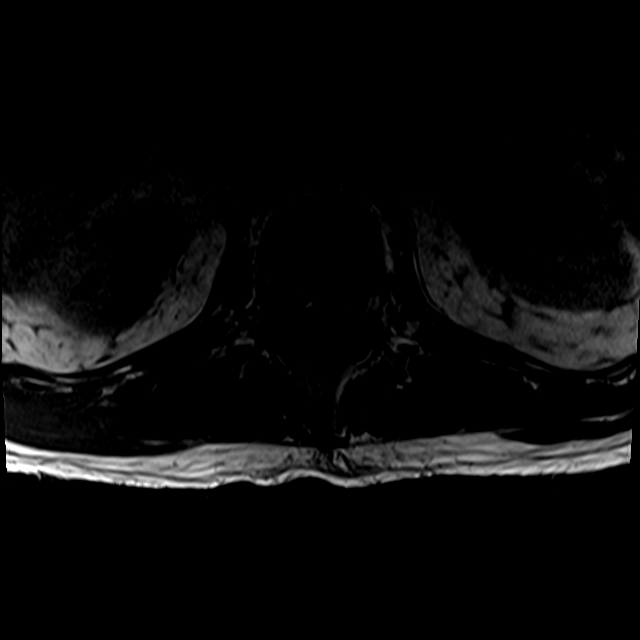

[22 of 48 positions shown; findings below may reference images not displayed]

FINDINGS: MRI THORACIC SPINE FINDINGS

Alignment:  Physiologic.

Vertebrae: Improved T6-T7 discitis osteomyelitis with decreased pus
in the disc space. T7 superior endplate erosion has also improved.
Intense vertebral body and posterior element marrow edema and
enhancement from T5 through T8 is not significantly changed.

Cord: Decreased posterior epidural phlegmon compared to prior study
without significant residual rim enhancing fluid collection. Similar
ventral epidural phlegmon from T5 through T7. Continued moderate
spinal canal stenosis at T6-T7 due to epidural phlegmon.

Paraspinal and other soft tissues: Small bilateral pleural effusions
have decreased since the prior study. Patchy opacities in both lower
lobes. Extensive paravertebral edema and enhancement from T5 through
T9 is similar.

Disc levels:

No additional high-grade spinal canal or neuroforaminal stenosis.

MRI LUMBAR SPINE FINDINGS

Segmentation:  Standard.

Alignment:  Physiologic.

Vertebrae: No fracture, evidence of discitis, or bone lesion.
Similar acute on chronic degenerative endplate marrow signal changes
at L5-S1 with minimal endplate enhancement and trace fluid in the
disc space.

Conus medullaris: Extends to the L1-L2 level and appears normal. No
intradural enhancement.

Paraspinal and other soft tissues: No paravertebral inflammatory
changes. Similar bilateral paraspinous muscle edema without
enhancement. Unchanged mild bilateral hydronephrosis. Unchanged
complex left renal cyst.

Disc levels:

T12-L1: Unchanged tiny left paracentral disc protrusion. No
stenosis.

L1-L2:  Unchanged mild disc bulging. No stenosis.

L2-L3: Unchanged tiny left paracentral disc protrusion and annular
fissure. No stenosis.

L3-L4:  Negative.

L4-L5: Unchanged small broad-based posterior disc protrusion and
mild bilateral facet arthropathy. Unchanged mild bilateral
neuroforaminal stenosis. No spinal canal stenosis.

L5-S1: Unchanged severe disc height loss, small circumferential disc
osteophyte complex, and superimposed small central disc protrusion.
Unchanged mild to moderate bilateral facet arthropathy. Unchanged
moderate right greater than left neuroforaminal stenosis. No spinal
canal stenosis.
IMPRESSION: Thoracic spine:

1. Continued interval improvement in T6-T7 discitis osteomyelitis
with decreased pus in the disc space and improved erosion of the T7
superior endplate. Decreased posterior epidural phlegmon. Continued
moderate spinal canal stenosis at T6-T7 due to epidural phlegmon.
2. Extensive marrow edema and enhancement involving the T5 through
T8 vertebral bodies and posterior elements is not significantly
changed and remains consistent with osteomyelitis. Extensive
paravertebral edema and enhancement from T5 through T9 is also
similar to prior study.
3. Small bilateral pleural effusions have decreased since the prior
study. Patchy opacities in both lower lobes may be due to
atelectasis or pneumonia.

Lumbar spine:

1. No evidence of osteomyelitis-discitis.
2. Unchanged lumbar spondylosis as described above. Unchanged
moderate right greater than left neuroforaminal stenosis at L5-S1.
3. Unchanged mild bilateral hydronephrosis.

## 2021-09-13 IMAGING — MR MR THORACIC SPINE WO/W CM
5 of 9 series · 23 of 48 positions shown · IV contrast (Gadavist)
Comparison: MRI thoracic spine dated [DATE]. MRI
thoracic and lumbar spine dated [DATE].

CLINICAL DATA: Thoracic osteomyelitis discitis.

EXAM:
MRI THORACIC AND LUMBAR SPINE WITHOUT AND WITH CONTRAST
TECHNIQUE: Multiplanar and multiecho pulse sequences of the thoracic and lumbar
spine were obtained without and with intravenous contrast.
CONTRAST:  10mL GADAVIST GADOBUTROL 1 MMOL/ML IV SOLN

[Series 14: T1 · sagittal · 6.0mm · 1.09mm/px · 3 of 8 slices shown (1 of 3)]
[im 1/8]
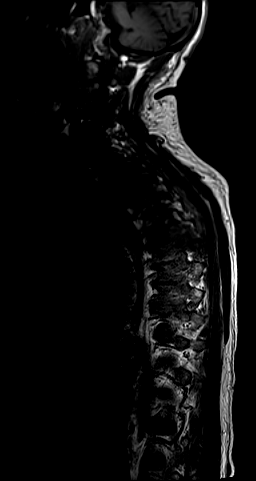
[im 4/8]
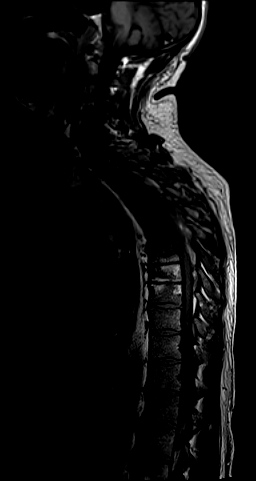
[im 8/8]
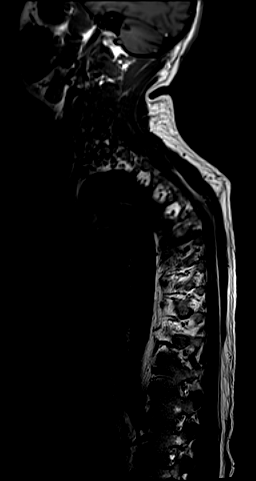

[Series 15: T2 · sagittal · 3.0mm · 0.76mm/px · 4 of 17 slices shown (1 of 2)]
[im 1/17]
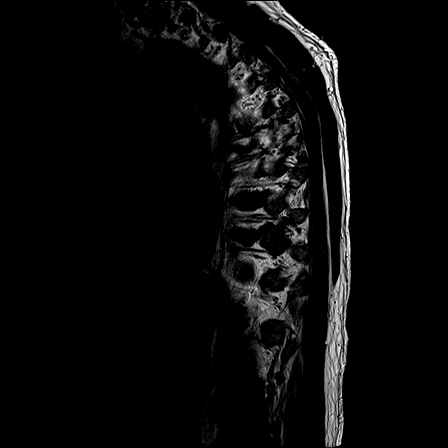
[im 6/17]
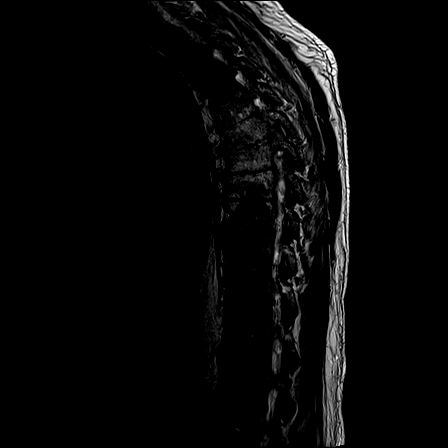
[im 11/17]
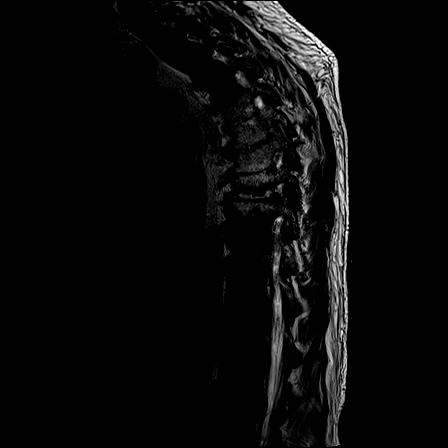
[im 17/17]
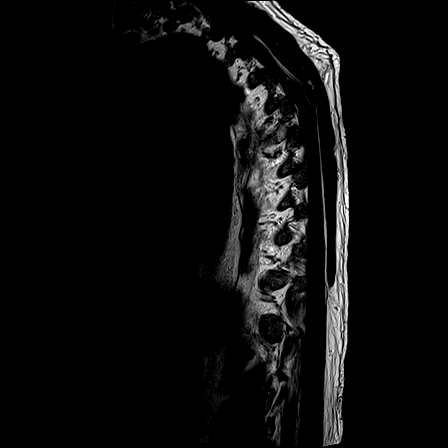

[Series 16: T1 · sagittal · 3.0mm · 0.76mm/px · 3 of 17 slices shown (2 of 3)]
[im 1/17]
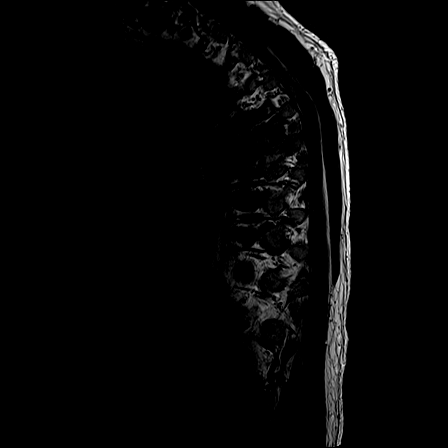
[im 9/17]
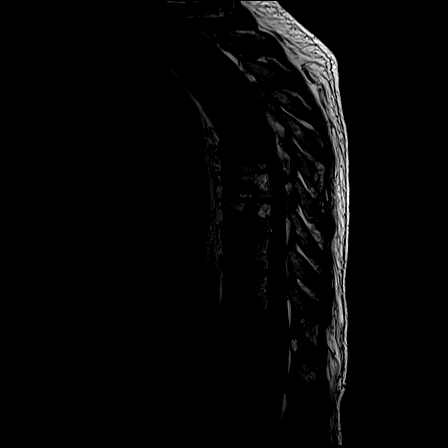
[im 17/17]
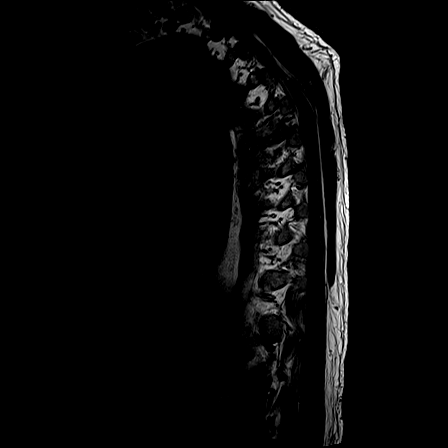

[Series 18: T2 · axial · 4.0mm · 0.59mm/px · z∈[-277,-31]mm · 8 of 44 slices shown (2 of 2)]
[im 1/44]
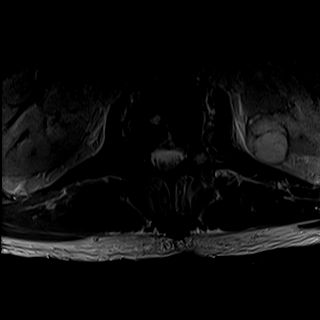
[im 7/44]
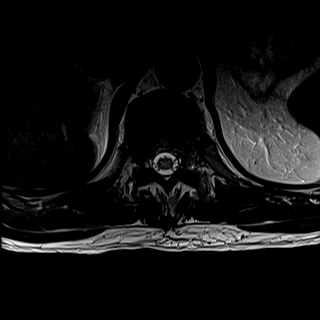
[im 13/44]
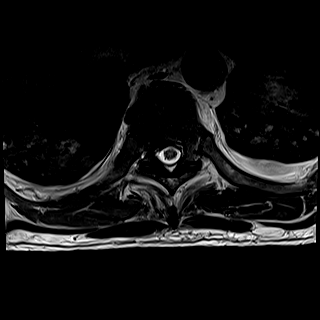
[im 19/44]
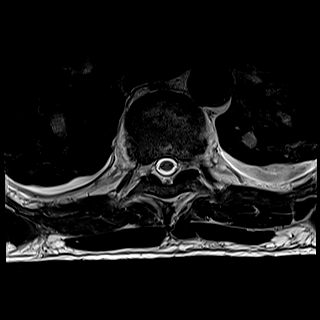
[im 25/44]
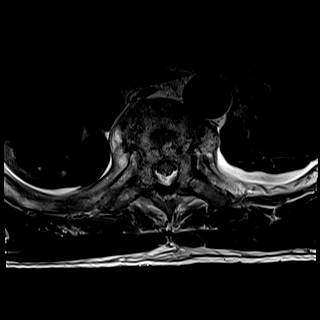
[im 31/44]
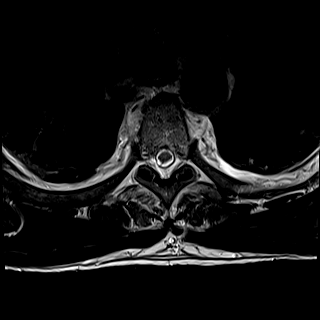
[im 37/44]
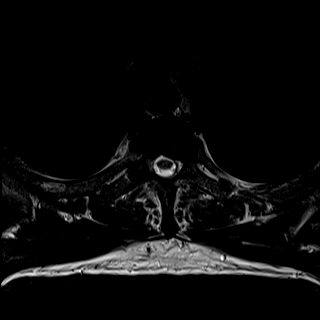
[im 44/44]
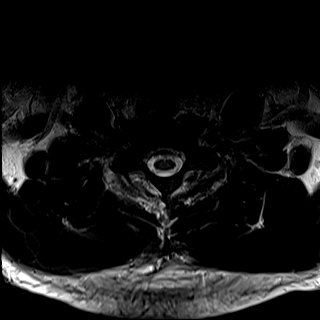

[Series 20: T1 · axial · non-contrast · 4.0mm · 0.31mm/px · z∈[-277,-97]mm · 5 of 44 slices shown (3 of 3)]
[im 1/44]
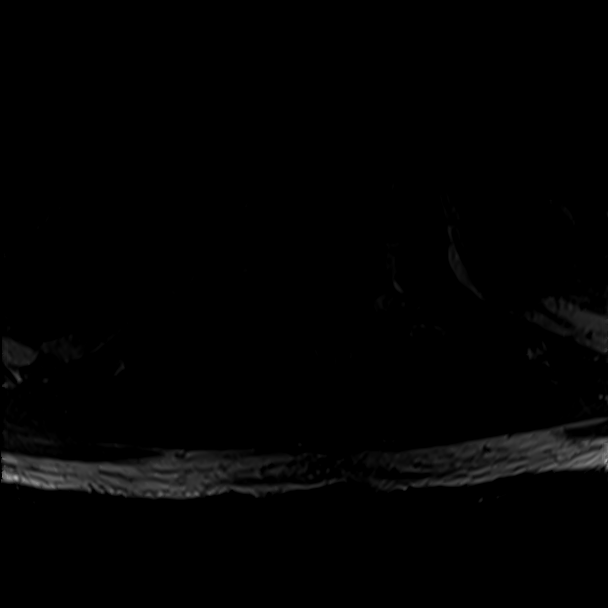
[im 7/44]
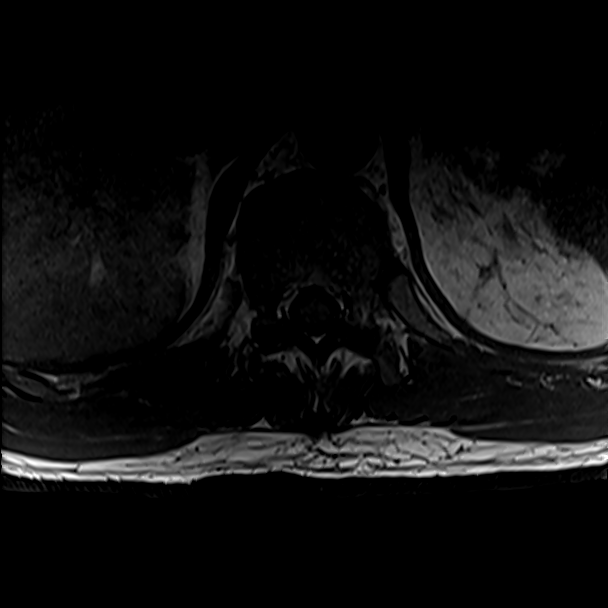
[im 13/44]
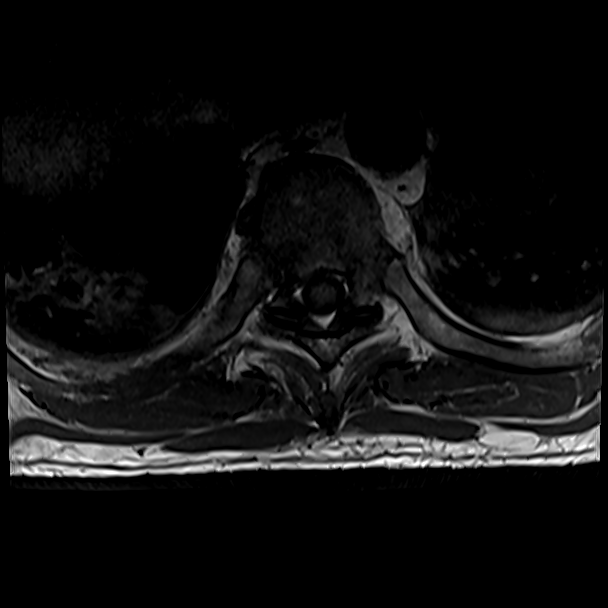
[im 19/44]
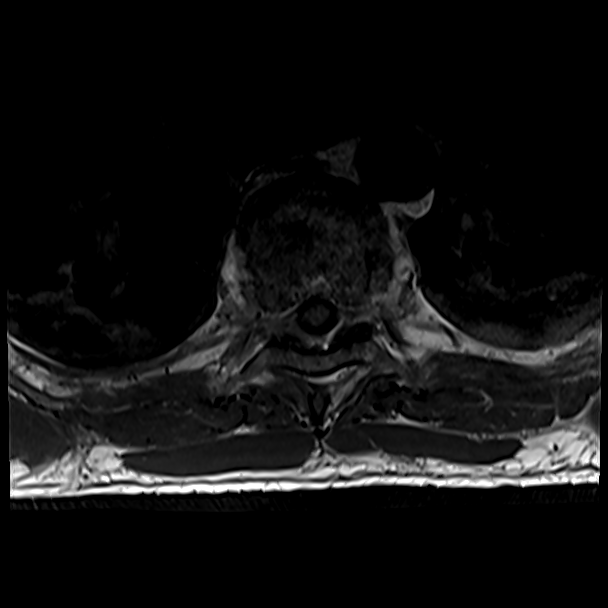
[im 25/44]
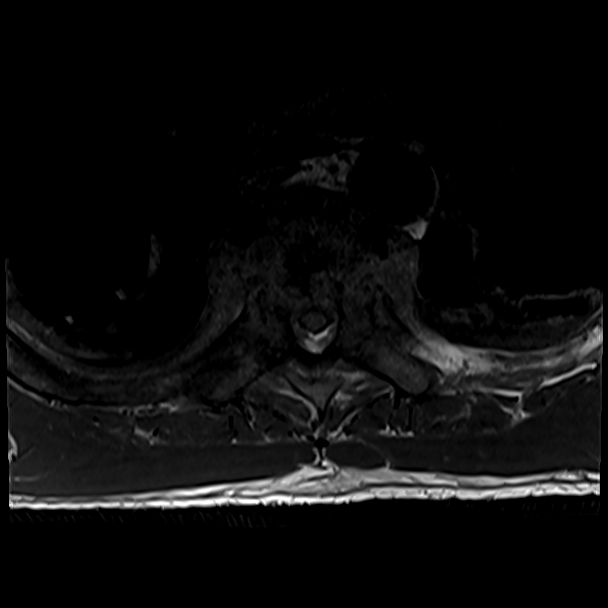

[23 of 48 positions shown; findings below may reference images not displayed]

FINDINGS: MRI THORACIC SPINE FINDINGS

Alignment:  Physiologic.

Vertebrae: Improved T6-T7 discitis osteomyelitis with decreased pus
in the disc space. T7 superior endplate erosion has also improved.
Intense vertebral body and posterior element marrow edema and
enhancement from T5 through T8 is not significantly changed.

Cord: Decreased posterior epidural phlegmon compared to prior study
without significant residual rim enhancing fluid collection. Similar
ventral epidural phlegmon from T5 through T7. Continued moderate
spinal canal stenosis at T6-T7 due to epidural phlegmon.

Paraspinal and other soft tissues: Small bilateral pleural effusions
have decreased since the prior study. Patchy opacities in both lower
lobes. Extensive paravertebral edema and enhancement from T5 through
T9 is similar.

Disc levels:

No additional high-grade spinal canal or neuroforaminal stenosis.

MRI LUMBAR SPINE FINDINGS

Segmentation:  Standard.

Alignment:  Physiologic.

Vertebrae: No fracture, evidence of discitis, or bone lesion.
Similar acute on chronic degenerative endplate marrow signal changes
at L5-S1 with minimal endplate enhancement and trace fluid in the
disc space.

Conus medullaris: Extends to the L1-L2 level and appears normal. No
intradural enhancement.

Paraspinal and other soft tissues: No paravertebral inflammatory
changes. Similar bilateral paraspinous muscle edema without
enhancement. Unchanged mild bilateral hydronephrosis. Unchanged
complex left renal cyst.

Disc levels:

T12-L1: Unchanged tiny left paracentral disc protrusion. No
stenosis.

L1-L2:  Unchanged mild disc bulging. No stenosis.

L2-L3: Unchanged tiny left paracentral disc protrusion and annular
fissure. No stenosis.

L3-L4:  Negative.

L4-L5: Unchanged small broad-based posterior disc protrusion and
mild bilateral facet arthropathy. Unchanged mild bilateral
neuroforaminal stenosis. No spinal canal stenosis.

L5-S1: Unchanged severe disc height loss, small circumferential disc
osteophyte complex, and superimposed small central disc protrusion.
Unchanged mild to moderate bilateral facet arthropathy. Unchanged
moderate right greater than left neuroforaminal stenosis. No spinal
canal stenosis.
IMPRESSION: Thoracic spine:

1. Continued interval improvement in T6-T7 discitis osteomyelitis
with decreased pus in the disc space and improved erosion of the T7
superior endplate. Decreased posterior epidural phlegmon. Continued
moderate spinal canal stenosis at T6-T7 due to epidural phlegmon.
2. Extensive marrow edema and enhancement involving the T5 through
T8 vertebral bodies and posterior elements is not significantly
changed and remains consistent with osteomyelitis. Extensive
paravertebral edema and enhancement from T5 through T9 is also
similar to prior study.
3. Small bilateral pleural effusions have decreased since the prior
study. Patchy opacities in both lower lobes may be due to
atelectasis or pneumonia.

Lumbar spine:

1. No evidence of osteomyelitis-discitis.
2. Unchanged lumbar spondylosis as described above. Unchanged
moderate right greater than left neuroforaminal stenosis at L5-S1.
3. Unchanged mild bilateral hydronephrosis.

## 2021-09-13 SURGERY — MRI WITH ANESTHESIA
Anesthesia: General

## 2021-09-13 MED ORDER — CEFADROXIL 500 MG PO CAPS: 1000.0000 mg | ORAL_CAPSULE | ORAL | Status: DC

## 2021-09-13 MED ORDER — OXYCODONE HCL 5 MG/5ML PO SOLN: 5.0000 mg | Freq: Once | ORAL | Status: DC | PRN

## 2021-09-13 MED ORDER — INSULIN ASPART 100 UNIT/ML IJ SOLN
0.0000 [IU] | Freq: Three times a day (TID) | INTRAMUSCULAR | Status: DC
  Administered 2021-09-14: 9 [IU] via SUBCUTANEOUS
  Administered 2021-09-14: 2 [IU] via SUBCUTANEOUS
  Administered 2021-09-14: 3 [IU] via SUBCUTANEOUS
  Administered 2021-09-15 (×2): 2 [IU] via SUBCUTANEOUS
  Administered 2021-09-16: 3 [IU] via SUBCUTANEOUS
  Administered 2021-09-16: 2 [IU] via SUBCUTANEOUS
  Administered 2021-09-16: 3 [IU] via SUBCUTANEOUS
  Administered 2021-09-17: 7 [IU] via SUBCUTANEOUS
  Administered 2021-09-17 – 2021-09-18 (×2): 5 [IU] via SUBCUTANEOUS
  Administered 2021-09-18: 9 [IU] via SUBCUTANEOUS
  Administered 2021-09-18: 3 [IU] via SUBCUTANEOUS
  Administered 2021-09-19 (×2): 5 [IU] via SUBCUTANEOUS

## 2021-09-13 MED ORDER — GADOBUTROL 1 MMOL/ML IV SOLN
10.0000 mL | Freq: Once | INTRAVENOUS | Status: AC | PRN
  Administered 2021-09-13: 10 mL via INTRAVENOUS

## 2021-09-13 MED ORDER — OXYCODONE HCL 5 MG PO TABS: 5.0000 mg | ORAL_TABLET | Freq: Once | ORAL | Status: DC | PRN

## 2021-09-13 MED ORDER — AMISULPRIDE (ANTIEMETIC) 5 MG/2ML IV SOLN: 10.0000 mg | Freq: Once | INTRAVENOUS | Status: DC | PRN

## 2021-09-13 MED ORDER — CHLORHEXIDINE GLUCONATE 0.12 % MT SOLN
15.0000 mL | Freq: Once | OROMUCOSAL | Status: AC
  Filled 2021-09-13: qty 15

## 2021-09-13 MED ORDER — FENTANYL CITRATE (PF) 100 MCG/2ML IJ SOLN: 25.0000 ug | INTRAMUSCULAR | Status: DC | PRN

## 2021-09-13 MED ORDER — APIXABAN 5 MG PO TABS
5.0000 mg | ORAL_TABLET | Freq: Two times a day (BID) | ORAL | Status: DC
  Administered 2021-09-13 – 2021-09-19 (×12): 5 mg via ORAL
  Filled 2021-09-13 (×12): qty 1

## 2021-09-13 MED ORDER — INSULIN DETEMIR 100 UNIT/ML ~~LOC~~ SOLN
36.0000 [IU] | Freq: Two times a day (BID) | SUBCUTANEOUS | Status: DC
  Administered 2021-09-13 (×2): 36 [IU] via SUBCUTANEOUS
  Filled 2021-09-13 (×4): qty 0.36

## 2021-09-13 MED ORDER — MIDAZOLAM HCL 2 MG/2ML IJ SOLN
INTRAMUSCULAR | Status: AC
  Filled 2021-09-13: qty 2

## 2021-09-13 MED ORDER — HYDROMORPHONE HCL 1 MG/ML IJ SOLN
1.0000 mg | Freq: Once | INTRAMUSCULAR | Status: AC
  Administered 2021-09-13: 1 mg via INTRAVENOUS
  Filled 2021-09-13: qty 1

## 2021-09-13 MED ORDER — ACETAMINOPHEN 10 MG/ML IV SOLN: 1000.0000 mg | Freq: Once | INTRAVENOUS | Status: DC | PRN

## 2021-09-13 MED ORDER — INSULIN ASPART 100 UNIT/ML IJ SOLN
6.0000 [IU] | Freq: Three times a day (TID) | INTRAMUSCULAR | Status: DC
  Administered 2021-09-14 – 2021-09-19 (×17): 6 [IU] via SUBCUTANEOUS

## 2021-09-13 MED ORDER — CHLORHEXIDINE GLUCONATE 0.12 % MT SOLN
OROMUCOSAL | Status: AC
  Administered 2021-09-13: 15 mL via OROMUCOSAL
  Filled 2021-09-13: qty 15

## 2021-09-13 MED ORDER — FINASTERIDE 5 MG PO TABS
5.0000 mg | ORAL_TABLET | Freq: Every day | ORAL | Status: DC
  Administered 2021-09-14 – 2021-09-19 (×6): 5 mg via ORAL
  Filled 2021-09-13 (×6): qty 1

## 2021-09-13 MED ORDER — ROCURONIUM BROMIDE 10 MG/ML (PF) SYRINGE
PREFILLED_SYRINGE | INTRAVENOUS | Status: DC | PRN
  Administered 2021-09-13 (×2): 10 mg via INTRAVENOUS
  Administered 2021-09-13: 20 mg via INTRAVENOUS
  Administered 2021-09-13: 40 mg via INTRAVENOUS

## 2021-09-13 MED ORDER — ONDANSETRON HCL 4 MG/2ML IJ SOLN
INTRAMUSCULAR | Status: AC
  Filled 2021-09-13: qty 2

## 2021-09-13 MED ORDER — CEFADROXIL 500 MG PO CAPS
1000.0000 mg | ORAL_CAPSULE | Freq: Two times a day (BID) | ORAL | Status: DC
  Administered 2021-09-13 – 2021-09-19 (×12): 1000 mg via ORAL
  Filled 2021-09-13 (×14): qty 2

## 2021-09-13 MED ORDER — PROMETHAZINE HCL 25 MG/ML IJ SOLN: 6.2500 mg | INTRAMUSCULAR | Status: DC | PRN

## 2021-09-13 MED ORDER — ONDANSETRON HCL 4 MG/2ML IJ SOLN
INTRAMUSCULAR | Status: DC | PRN
  Administered 2021-09-13: 4 mg via INTRAVENOUS

## 2021-09-13 MED ORDER — INSULIN GLARGINE-YFGN 100 UNIT/ML ~~LOC~~ SOLN
36.0000 [IU] | Freq: Two times a day (BID) | SUBCUTANEOUS | Status: DC
  Administered 2021-09-14 – 2021-09-19 (×11): 36 [IU] via SUBCUTANEOUS
  Filled 2021-09-13 (×12): qty 0.36

## 2021-09-13 MED ORDER — TRAZODONE HCL 50 MG PO TABS
150.0000 mg | ORAL_TABLET | Freq: Every evening | ORAL | Status: DC | PRN
  Administered 2021-09-13 – 2021-09-18 (×6): 150 mg via ORAL
  Filled 2021-09-13 (×6): qty 3

## 2021-09-13 MED ORDER — ETOMIDATE 2 MG/ML IV SOLN
INTRAVENOUS | Status: AC
  Filled 2021-09-13: qty 10

## 2021-09-13 MED ORDER — LORAZEPAM 0.5 MG PO TABS: 0.5000 mg | ORAL_TABLET | ORAL | Status: DC | PRN

## 2021-09-13 MED ORDER — PHENYLEPHRINE 40 MCG/ML (10ML) SYRINGE FOR IV PUSH (FOR BLOOD PRESSURE SUPPORT)
PREFILLED_SYRINGE | INTRAVENOUS | Status: DC | PRN
  Administered 2021-09-13: 120 ug via INTRAVENOUS

## 2021-09-13 MED ORDER — FENTANYL CITRATE (PF) 250 MCG/5ML IJ SOLN
INTRAMUSCULAR | Status: AC
  Filled 2021-09-13: qty 5

## 2021-09-13 MED ORDER — HYDROMORPHONE HCL 1 MG/ML IJ SOLN
1.0000 mg | INTRAMUSCULAR | Status: DC | PRN
  Administered 2021-09-13 (×2): 1 mg via INTRAVENOUS
  Filled 2021-09-13 (×4): qty 1

## 2021-09-13 MED ORDER — HYDROMORPHONE HCL 2 MG PO TABS
2.0000 mg | ORAL_TABLET | ORAL | Status: DC | PRN
  Administered 2021-09-13 – 2021-09-14 (×6): 2 mg via ORAL
  Filled 2021-09-13 (×6): qty 1

## 2021-09-13 MED ORDER — PHENYLEPHRINE HCL-NACL 20-0.9 MG/250ML-% IV SOLN
INTRAVENOUS | Status: DC | PRN
  Administered 2021-09-13: 40 ug/min via INTRAVENOUS

## 2021-09-13 MED ORDER — LORAZEPAM 2 MG/ML IJ SOLN
1.0000 mg | INTRAMUSCULAR | Status: AC | PRN
  Administered 2021-09-13: 1 mg via INTRAVENOUS
  Filled 2021-09-13: qty 1

## 2021-09-13 MED ORDER — DEXAMETHASONE SODIUM PHOSPHATE 10 MG/ML IJ SOLN
INTRAMUSCULAR | Status: AC
  Filled 2021-09-13: qty 1

## 2021-09-13 MED ORDER — AMIODARONE HCL 200 MG PO TABS
200.0000 mg | ORAL_TABLET | Freq: Every day | ORAL | Status: DC
  Administered 2021-09-13 – 2021-09-19 (×7): 200 mg via ORAL
  Filled 2021-09-13 (×7): qty 1

## 2021-09-13 MED ORDER — LORAZEPAM 2 MG/ML IJ SOLN: 0.5000 mg | INTRAMUSCULAR | Status: DC | PRN

## 2021-09-13 MED ORDER — LACTATED RINGERS IV SOLN: INTRAVENOUS | Status: DC

## 2021-09-13 MED ORDER — OXYCODONE HCL ER 15 MG PO T12A
40.0000 mg | EXTENDED_RELEASE_TABLET | Freq: Two times a day (BID) | ORAL | Status: DC
  Administered 2021-09-14 – 2021-09-19 (×11): 40 mg via ORAL
  Filled 2021-09-13 (×11): qty 1

## 2021-09-13 MED ORDER — LIDOCAINE 2% (20 MG/ML) 5 ML SYRINGE
INTRAMUSCULAR | Status: DC | PRN
  Administered 2021-09-13: 60 mg via INTRAVENOUS

## 2021-09-13 MED ORDER — DEXAMETHASONE SODIUM PHOSPHATE 10 MG/ML IJ SOLN
INTRAMUSCULAR | Status: DC | PRN
Start: 2021-09-13 — End: 2021-09-13
  Administered 2021-09-13: 5 mg via INTRAVENOUS

## 2021-09-13 MED ORDER — FENTANYL CITRATE (PF) 250 MCG/5ML IJ SOLN
INTRAMUSCULAR | Status: DC | PRN
  Administered 2021-09-13: 150 ug via INTRAVENOUS
  Administered 2021-09-13: 100 ug via INTRAVENOUS

## 2021-09-13 MED ORDER — PROPOFOL 10 MG/ML IV BOLUS
INTRAVENOUS | Status: DC | PRN
  Administered 2021-09-13: 200 mg via INTRAVENOUS

## 2021-09-13 MED ORDER — METOPROLOL SUCCINATE ER 25 MG PO TB24
25.0000 mg | ORAL_TABLET | Freq: Every day | ORAL | Status: DC
  Administered 2021-09-14 – 2021-09-19 (×6): 25 mg via ORAL
  Filled 2021-09-13 (×7): qty 1

## 2021-09-13 MED ORDER — EPHEDRINE SULFATE-NACL 50-0.9 MG/10ML-% IV SOSY
PREFILLED_SYRINGE | INTRAVENOUS | Status: DC | PRN
  Administered 2021-09-13: 10 mg via INTRAVENOUS
  Administered 2021-09-13: 5 mg via INTRAVENOUS

## 2021-09-13 MED ORDER — MIDAZOLAM HCL 2 MG/2ML IJ SOLN
INTRAMUSCULAR | Status: DC | PRN
  Administered 2021-09-13: 2 mg via INTRAVENOUS

## 2021-09-13 MED ORDER — SUGAMMADEX SODIUM 200 MG/2ML IV SOLN
INTRAVENOUS | Status: DC | PRN
  Administered 2021-09-13: 200 mg via INTRAVENOUS

## 2021-09-13 MED ORDER — TAMSULOSIN HCL 0.4 MG PO CAPS
0.4000 mg | ORAL_CAPSULE | Freq: Every day | ORAL | Status: DC
  Administered 2021-09-14 – 2021-09-19 (×6): 0.4 mg via ORAL
  Filled 2021-09-13 (×6): qty 1

## 2021-09-13 NOTE — Transfer of Care (Signed)
Immediate Anesthesia Transfer of Care Note  Patient: [name]  Procedure(s) Performed: MRI WITH ANESTHESIA  Patient Location: PACU  Anesthesia Type:General  Level of Consciousness: awake and alert   Airway & Oxygen Therapy: Patient Spontanous Breathing and Patient connected to face mask oxygen  Post-op Assessment: Report given to RN and Post -op Vital signs reviewed and stable  Post vital signs: Reviewed and stable  Last Vitals:  Vitals Value Taken Time  BP 141/87 09/13/21 1618  Temp 37C 09/13/21 1618  Pulse 88 09/13/21 1620  Resp 19 09/13/21 1620  SpO2 98 % 09/13/21 1620  Vitals shown include unvalidated device data.  Last Pain:  Vitals:   09/13/21 1233  TempSrc:   PainSc: 6          Complications: No notable events documented.

## 2021-09-13 NOTE — Anesthesia Procedure Notes (Signed)
Procedure Name: Intubation Date/Time: 09/13/2021 2:16 PM Performed by: Reece Agar, CRNA Pre-anesthesia Checklist: Patient identified, Emergency Drugs available, Suction available and Patient being monitored Patient Re-evaluated:Patient Re-evaluated prior to induction Oxygen Delivery Method: Circle System Utilized Preoxygenation: Pre-oxygenation with 100% oxygen Induction Type: IV induction Ventilation: Mask ventilation without difficulty Laryngoscope Size: Glidescope and 4 Grade View: Grade I Tube type: Oral Tube size: 7.5 mm Number of attempts: 1 Airway Equipment and Method: Stylet and Oral airway Placement Confirmation: ETT inserted through vocal cords under direct vision, positive ETCO2 and breath sounds checked- equal and bilateral Secured at: 23 cm Tube secured with: Tape Dental Injury: Teeth and Oropharynx as per pre-operative assessment

## 2021-09-13 NOTE — Progress Notes (Signed)
°  Transition of Care [facility]) Screening Note   Patient Details  Name: [name] Date of Birth: 1966-04-17   Transition of Care [facility]) CM/SW Contact:    Alfredia Ferguson, LCSW Phone Number: 09/13/2021, 8:25 AM    Transition of Care Department [facility]) has reviewed patient and no TOC needs have been identified at this immediate time. Patient arrived from [facility] and CSW anticipates that patient will likely return there or to another SNF. CSW will follow pending medical disposition. Patient's insurance will require a new PT/OT evaluation to return to SNF. TOC will continue to follow at time.

## 2021-09-13 NOTE — H&P (Addendum)
History and Physical    [name] MBW:[id number] DOB: [birth year] DOA: 09/12/2021  Referring MD/NP/PA: Octaviano Glow, MD PCP: Patient, No Pcp Per (Inactive)  Patient coming from: [facility] via ems  Chief Complaint: Back Pain  I have personally briefly reviewed patient's old medical records in [place]   HPI: [name] is a 56 y.o. 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  Patient had just recently had a prolonged hospitalization 07/21/2021-09/09/2021 after initially presenting for progressive wound infection of a prior left great toe amputation site which resulted in septic shock with group B streptococcal bacteremia.  Patient was admitted to the ICU orthopedics on left transtibial amputation on 11/16.  Hospitalization has been prolonged and complicated by thoracic discitis/osteomyelitis with epidural abscess and new diagnosis of heart failure with reduced EF, atrial flutter, and sacral decubitus ulcer.  After getting to [facility] patient reported that the rehab and wound care aspects of the facility were great.  However, they did not have his pain medicine, he missed doses of his antibiotics, and nursing care was not great.  At one point in time he reports that he was sitting in his own feces for 40 minutes and therefore called the cops.  Patient states that his midthoracic back pain was not adequately controlled at the nursing care facility.  He does not want to go back there and would rather be discharged home and have health come for help with wound care.  He reports that the facility was also very hot and causing him to sweat.   ED Course: Upon admission to the emergency department patient was noted to have temperature of 99 F, pulse 78-1 09, respiration 15-23, blood pressures elevated up to 168/102, and O2 saturations  maintained on room air.  Initial labs significant for WBC 11.7, hemoglobin 12.7, sodium 130, BUN 14, creatinine 1.09, glucose 264, and CRP 10.4.  Admission at been initially deferred pending MRI, but MRI needing to be obtained under general anesthesia.  Anesthesia have been consulted and hospitalist admission was recommended due to patient's medical conditions and due to what the ED provider reported as the patient being fired from the nursing facility.  Patient also reported that he did not want to go back to this facility.  Review of Systems  Constitutional:  Positive for fever (low grade).  Musculoskeletal:  Positive for back pain.  Skin:        Positive for sacral wound   Past Medical History:  Diagnosis Date   Chronic diastolic CHF (congestive heart failure) (HCC)    Complication of anesthesia    Depression    Diabetes mellitus without complication ([place])    Hypertension     Past Surgical History:  Procedure Laterality Date   AMPUTATION Left 07/23/2021   Procedure: LEFT BELOW KNEE AMPUTATION;  Surgeon: [name], MD;  Location: [facility];  Service: Orthopedics;  Laterality: Left;   BELOW KNEE LEG AMPUTATION Right    BUBBLE STUDY  07/29/2021   Procedure: BUBBLE STUDY;  Surgeon: Sueanne Margarita, MD;  Location: [place];  Service: Cardiovascular;;   CARDIOVERSION N/A 07/29/2021   Procedure: CARDIOVERSION;  Surgeon: Sueanne Margarita, MD;  Location: [facility] ENDOSCOPY;  Service: Cardiovascular;  Laterality: N/A;   RADIOLOGY WITH ANESTHESIA N/A 08/05/2021   Procedure: MRI LUMBAR WITH AND WITHOUT; THORASIC SPINE WITH AND WITHOUT WITH ANESTHESIA;  Surgeon:  Radiologist, Medication, MD;  Location: [facility];  Service: Radiology;  Laterality: N/A;   RADIOLOGY WITH ANESTHESIA N/A 08/07/2021   Procedure: MRI WITH LUMBER WITH AND WITHOUT CONTRAST,THORACIC WITH AND WITHOUT CONTRAST;  Surgeon: Radiologist, Medication, MD;  Location: [place];  Service: Radiology;  Laterality: N/A;   TEE WITHOUT CARDIOVERSION  N/A 07/29/2021   Procedure: TRANSESOPHAGEAL ECHOCARDIOGRAM (TEE);  Surgeon: Sueanne Margarita, MD;  Location: [facility] ENDOSCOPY;  Service: Cardiovascular;  Laterality: N/A;     reports that he has never smoked. He has never used smokeless tobacco. He reports that he does not drink alcohol and does not use drugs.  Allergies  Allergen Reactions   Bactrim [Sulfamethoxazole-Trimethoprim]    Ceprotin [Protein C Concentrate (Human)]    Ciprofloxacin Other (See Comments)    Kidney function   Levaquin [Levofloxacin]     Family History  Problem Relation Age of Onset   Anxiety disorder Mother    Cancer Father    Coronary artery disease Father     Prior to Admission medications   Medication Sig Start Date End Date Taking? Authorizing Provider  amiodarone (PACERONE) 200 MG tablet Take 1 tablet (200 mg total) by mouth daily. 09/10/21 10/10/21 Yes Vashti Hey, MD  apixaban (ELIQUIS) 5 MG TABS tablet Take 1 tablet (5 mg total) by mouth 2 (two) times daily. 09/09/21 10/09/21 Yes Vashti Hey, MD  cefadroxil (DURICEF) 500 MG capsule Take 2 capsules (1,000 mg total) by mouth 2 (two) times daily for 44 doses. Patient taking differently: Take 1,000 mg by mouth See admin instructions. Bid x 44 doses 09/09/21 10/01/21 Yes Chatterjee, Kyra Searles, MD  Ensure Plus (ENSURE PLUS) LIQD Take 237 mLs by mouth 2 (two) times daily between meals.   Yes [provider]  finasteride (PROSCAR) 5 MG tablet Take 1 tablet (5 mg total) by mouth daily. 09/10/21 10/10/21 Yes Vashti Hey, MD  gabapentin (NEURONTIN) 400 MG capsule Take 1 capsule (400 mg total) by mouth 3 (three) times daily. 09/09/21 10/09/21 Yes Bonnell Public Tublu, MD  insulin aspart (NOVOLOG) 100 UNIT/ML injection Inject 6 Units into the skin 3 (three) times daily with meals. 09/09/21  Yes Bonnell Public Tublu, MD  insulin detemir (LEVEMIR) 100 UNIT/ML injection Inject 0.36 mLs (36 Units total) into the skin 2 (two) times  daily. 09/09/21  Yes Vashti Hey, MD  Insulin Glargine (LANTUS SOLOSTAR [place]) Inject 36 Units into the skin in the morning and at bedtime.   Yes [provider]  liver oil-zinc oxide (DESITIN) 40 % ointment Apply topically daily as needed for irritation. Patient taking differently: Apply 1 application topically every 6 (six) hours as needed for irritation. 09/09/21  Yes Vashti Hey, MD  magnesium hydroxide (MILK OF MAGNESIA) 400 MG/5ML suspension Take 30 mLs by mouth daily as needed for mild constipation.   Yes [provider]  methocarbamol 1000 MG TABS Take 1,000 mg by mouth 3 (three) times daily. 09/09/21 10/09/21 Yes Vashti Hey, MD  metoprolol succinate (TOPROL-XL) 25 MG 24 hr tablet Take 1 tablet (25 mg total) by mouth daily. 09/10/21 10/10/21 Yes Vashti Hey, MD  Multiple Vitamin (MULTIVITAMIN WITH MINERALS) TABS tablet Take 1 tablet by mouth daily. 09/10/21 10/10/21 Yes Vashti Hey, MD  Oxycodone HCl 10 MG TABS Take 10 mg by mouth every 8 (eight) hours as needed (chronic pain).   Yes [provider]  sodium hypochlorite (DAKIN'S 1/4 STRENGTH) 0.125 % SOLN Irrigate with as directed daily for 10 days.  Patient taking differently: Irrigate with 1 application as directed daily. 09/10/21 09/20/21 Yes Vashti Hey, MD  tamsulosin (FLOMAX) 0.4 MG CAPS capsule Take 1 capsule (0.4 mg total) by mouth daily. 09/10/21 10/10/21 Yes Vashti Hey, MD  testosterone (ANDROGEL) 50 MG/5GM (1%) GEL Place 5 g onto the skin daily. 09/10/21 10/10/21 Yes Vashti Hey, MD  testosterone (ANDROGEL) 50 MG/5GM (1%) GEL Place 5 g onto the skin daily as needed (hypogonadism).   Yes [provider]  traZODone (DESYREL) 150 MG tablet Take 1 tablet (150 mg total) by mouth at bedtime as needed for up to 15 days for sleep. 09/09/21 09/24/21 Yes Vashti Hey, MD  Chlorhexidine Gluconate Cloth 2 % PADS Apply  6 each topically daily for 10 days. Patient not taking: Reported on 09/13/2021 09/10/21 09/20/21  Vashti Hey, MD  collagenase (SANTYL) ointment Apply topically daily. Patient not taking: Reported on 09/13/2021 09/10/21   Vashti Hey, MD  Ensure Max Protein (ENSURE MAX PROTEIN) LIQD Take 330 mLs (11 oz total) by mouth 2 (two) times daily between meals for 10 days. Patient not taking: Reported on 09/13/2021 09/09/21 09/19/21  Vashti Hey, MD  HYDROcodone-acetaminophen (NORCO/VICODIN) 5-325 MG tablet Take 1 tablet by mouth once. Patient not taking: Reported on 09/13/2021    [provider]  nystatin cream (MYCOSTATIN) Apply topically 2 (two) times daily. Patient not taking: Reported on 09/13/2021 09/09/21   Vashti Hey, MD  oxyCODONE-acetaminophen (PERCOCET/ROXICET) 5-325 MG tablet Take 2 tablets by mouth once. Patient not taking: Reported on 09/13/2021    [provider]    Physical Exam:  Constitutional: Middle-age male currently in no acute distress. Vitals:   09/13/21 1631 09/13/21 1647 09/13/21 1950 09/13/21 2032  BP: 131/75 132/79 134/82 121/66  Pulse: 90 95 88 95  Resp: 19 18 16    Temp:  98.7 F (37.1 C) 98.6 F (37 C) 98.1 F (36.7 C)  TempSrc:   Oral Oral  SpO2: 94% 100% 99% 97%   Eyes: PERRL, lids and conjunctivae normal ENMT: Mucous membranes are moist.  Neck: normal, supple, no masses, no thyromegaly.  No JVD appreciated Respiratory: clear to auscultation bilaterally, no wheezing, no crackles. Normal respiratory effort.  Cardiovascular: Regular rate and rhythm, no murmurs / rubs / gallops. No extremity edema.  Abdomen: no tenderness, no masses palpated. No hepatosplenomegaly. Bowel sounds positive.  Musculoskeletal: no clubbing / cyanosis.  Bilateral below-knee amputations Skin: Stage IV sacral decubitus wound Neurologic: CN 2-12 grossly intact.  Able to move all extremities. Psychiatric: Normal judgment and insight.  Alert and oriented x 3. Normal mood.     Labs on Admission: I have personally reviewed following labs and imaging studies  CBC: Recent Labs  Lab 09/07/21 0424 09/13/21 0034  WBC 8.4 11.7*  NEUTROABS  --  10.1*  HGB 11.4* 12.7*  HCT 35.4* 38.4*  MCV 90.8 88.7  PLT 233 476   Basic Metabolic Panel: Recent Labs  Lab 09/07/21 0424 09/13/21 0034  NA 134* 130*  K 4.1 4.0  CL 98 97*  CO2 26 22  GLUCOSE 122* 264*  BUN 22* 14  CREATININE 1.24 1.09  CALCIUM 8.7* 8.8*   GFR: CrCl cannot be calculated (Unknown ideal weight.). Liver Function Tests: No results for input(s): AST, ALT, ALKPHOS, BILITOT, PROT, ALBUMIN in the last 168 hours. No results for input(s): LIPASE, AMYLASE in the last 168 hours. No results for input(s): AMMONIA in the last 168 hours. Coagulation Profile: No results for input(s):  INR, PROTIME in the last 168 hours. Cardiac Enzymes: No results for input(s): CKTOTAL, CKMB, CKMBINDEX, TROPONINI in the last 168 hours. BNP (last 3 results) No results for input(s): PROBNP in the last 8760 hours. HbA1C: No results for input(s): HGBA1C in the last 72 hours. CBG: Recent Labs  Lab 09/13/21 1142 09/13/21 1618 09/13/21 1648 09/13/21 1733 09/13/21 2131  GLUCAP 224* 64* 112* 115* 388*   Lipid Profile: No results for input(s): CHOL, HDL, LDLCALC, TRIG, CHOLHDL, LDLDIRECT in the last 72 hours. Thyroid Function Tests: No results for input(s): TSH, T4TOTAL, FREET4, T3FREE, THYROIDAB in the last 72 hours. Anemia Panel: No results for input(s): VITAMINB12, FOLATE, FERRITIN, TIBC, IRON, RETICCTPCT in the last 72 hours. Urine analysis:    Component Value Date/Time   COLORURINE STRAW (A) 09/05/2021 1245   APPEARANCEUR TURBID (A) 09/05/2021 1245   LABSPEC 1.011 09/05/2021 0017   PHURINE 7.0 09/05/2021 0017   GLUCOSEU NEGATIVE 09/05/2021 0017   HGBUR MODERATE (A) 09/05/2021 0017   BILIRUBINUR NEGATIVE 09/05/2021 0017   KETONESUR NEGATIVE 09/05/2021 0017    PROTEINUR 100 (A) 09/05/2021 0017   UROBILINOGEN 0.2 09/13/2019 0906   NITRITE POSITIVE (A) 09/05/2021 0017   LEUKOCYTESUR LARGE (A) 09/05/2021 0017   Sepsis Labs: Recent Results (from the past 240 hour(s))  Resp Panel by RT-PCR (Flu A&B, Covid) Nasopharyngeal Swab     Status: None   Collection Time: 09/06/21  8:56 AM   Specimen: Nasopharyngeal Swab; Nasopharyngeal(NP) swabs in vial transport medium  Result Value Ref Range Status   SARS Coronavirus 2 by RT PCR NEGATIVE NEGATIVE Final    Comment: (NOTE) SARS-CoV-2 target nucleic acids are NOT DETECTED.  The SARS-CoV-2 RNA is generally detectable in upper respiratory specimens during the acute phase of infection. The lowest concentration of SARS-CoV-2 viral copies this assay can detect is 138 copies/mL. A negative result does not preclude SARS-Cov-2 infection and should not be used as the sole basis for treatment or other patient management decisions. A negative result may occur with  improper specimen collection/handling, submission of specimen other than nasopharyngeal swab, presence of viral mutation(s) within the areas targeted by this assay, and inadequate number of viral copies(<138 copies/mL). A negative result must be combined with clinical observations, patient history, and epidemiological information. The expected result is Negative.  Fact Sheet for Patients:  EntrepreneurPulse.com.au  Fact Sheet for Healthcare Providers:  IncredibleEmployment.be  This test is no t yet approved or cleared by the [place] FDA and  has been authorized for detection and/or diagnosis of SARS-CoV-2 by FDA under an Emergency Use Authorization (EUA). This EUA will remain  in effect (meaning this test can be used) for the duration of the COVID-19 declaration under Section 564(b)(1) of the Act, 21 U.S.C.section 360bbb-3(b)(1), unless the authorization is terminated  or revoked sooner.       Influenza  A by PCR NEGATIVE NEGATIVE Final   Influenza B by PCR NEGATIVE NEGATIVE Final    Comment: (NOTE) The Xpert Xpress SARS-CoV-2/FLU/RSV plus assay is intended as an aid in the diagnosis of influenza from Nasopharyngeal swab specimens and should not be used as a sole basis for treatment. Nasal washings and aspirates are unacceptable for Xpert Xpress SARS-CoV-2/FLU/RSV testing.  Fact Sheet for Patients: EntrepreneurPulse.com.au  Fact Sheet for Healthcare Providers: IncredibleEmployment.be  This test is not yet approved or cleared by the [place] FDA and has been authorized for detection and/or diagnosis of SARS-CoV-2 by FDA under an Emergency Use Authorization (EUA). This EUA will remain in  effect (meaning this test can be used) for the duration of the COVID-19 declaration under Section 564(b)(1) of the Act, 21 U.S.C. section 360bbb-3(b)(1), unless the authorization is terminated or revoked.  Performed at [facility], [place] [address], [place], [place] [address]   Resp Panel by RT-PCR (Flu A&B, Covid) Nasopharyngeal Swab     Status: None   Collection Time: 09/09/21  3:53 PM   Specimen: Nasopharyngeal Swab; Nasopharyngeal(NP) swabs in vial transport medium  Result Value Ref Range Status   SARS Coronavirus 2 by RT PCR NEGATIVE NEGATIVE Final    Comment: (NOTE) SARS-CoV-2 target nucleic acids are NOT DETECTED.  The SARS-CoV-2 RNA is generally detectable in upper respiratory specimens during the acute phase of infection. The lowest concentration of SARS-CoV-2 viral copies this assay can detect is 138 copies/mL. A negative result does not preclude SARS-Cov-2 infection and should not be used as the sole basis for treatment or other patient management decisions. A negative result may occur with  improper specimen collection/handling, submission of specimen other than nasopharyngeal swab, presence of viral mutation(s) within the areas targeted  by this assay, and inadequate number of viral copies(<138 copies/mL). A negative result must be combined with clinical observations, patient history, and epidemiological information. The expected result is Negative.  Fact Sheet for Patients:  EntrepreneurPulse.com.au  Fact Sheet for Healthcare Providers:  IncredibleEmployment.be  This test is no t yet approved or cleared by the [place] FDA and  has been authorized for detection and/or diagnosis of SARS-CoV-2 by FDA under an Emergency Use Authorization (EUA). This EUA will remain  in effect (meaning this test can be used) for the duration of the COVID-19 declaration under Section 564(b)(1) of the Act, 21 U.S.C.section 360bbb-3(b)(1), unless the authorization is terminated  or revoked sooner.       Influenza A by PCR NEGATIVE NEGATIVE Final   Influenza B by PCR NEGATIVE NEGATIVE Final    Comment: (NOTE) The Xpert Xpress SARS-CoV-2/FLU/RSV plus assay is intended as an aid in the diagnosis of influenza from Nasopharyngeal swab specimens and should not be used as a sole basis for treatment. Nasal washings and aspirates are unacceptable for Xpert Xpress SARS-CoV-2/FLU/RSV testing.  Fact Sheet for Patients: EntrepreneurPulse.com.au  Fact Sheet for Healthcare Providers: IncredibleEmployment.be  This test is not yet approved or cleared by the [place] FDA and has been authorized for detection and/or diagnosis of SARS-CoV-2 by FDA under an Emergency Use Authorization (EUA). This EUA will remain in effect (meaning this test can be used) for the duration of the COVID-19 declaration under Section 564(b)(1) of the Act, 21 U.S.C. section 360bbb-3(b)(1), unless the authorization is terminated or revoked.  Performed at [facility], [place] [address] [place], [place] [address]   Resp Panel by RT-PCR (Flu A&B, Covid) Nasopharyngeal Swab     Status: None    Collection Time: 09/13/21 12:34 AM   Specimen: Nasopharyngeal Swab; Nasopharyngeal(NP) swabs in vial transport medium  Result Value Ref Range Status   SARS Coronavirus 2 by RT PCR NEGATIVE NEGATIVE Final    Comment: (NOTE) SARS-CoV-2 target nucleic acids are NOT DETECTED.  The SARS-CoV-2 RNA is generally detectable in upper respiratory specimens during the acute phase of infection. The lowest concentration of SARS-CoV-2 viral copies this assay can detect is 138 copies/mL. A negative result does not preclude SARS-Cov-2 infection and should not be used as the sole basis for treatment or other patient management decisions. A negative result may occur with  improper specimen  collection/handling, submission of specimen other than nasopharyngeal swab, presence of viral mutation(s) within the areas targeted by this assay, and inadequate number of viral copies(<138 copies/mL). A negative result must be combined with clinical observations, patient history, and epidemiological information. The expected result is Negative.  Fact Sheet for Patients:  EntrepreneurPulse.com.au  Fact Sheet for Healthcare Providers:  IncredibleEmployment.be  This test is no t yet approved or cleared by the [place] FDA and  has been authorized for detection and/or diagnosis of SARS-CoV-2 by FDA under an Emergency Use Authorization (EUA). This EUA will remain  in effect (meaning this test can be used) for the duration of the COVID-19 declaration under Section 564(b)(1) of the Act, 21 U.S.C.section 360bbb-3(b)(1), unless the authorization is terminated  or revoked sooner.       Influenza A by PCR NEGATIVE NEGATIVE Final   Influenza B by PCR NEGATIVE NEGATIVE Final    Comment: (NOTE) The Xpert Xpress SARS-CoV-2/FLU/RSV plus assay is intended as an aid in the diagnosis of influenza from Nasopharyngeal swab specimens and should not be used as a sole basis for treatment.  Nasal washings and aspirates are unacceptable for Xpert Xpress SARS-CoV-2/FLU/RSV testing.  Fact Sheet for Patients: EntrepreneurPulse.com.au  Fact Sheet for Healthcare Providers: IncredibleEmployment.be  This test is not yet approved or cleared by the [place] FDA and has been authorized for detection and/or diagnosis of SARS-CoV-2 by FDA under an Emergency Use Authorization (EUA). This EUA will remain in effect (meaning this test can be used) for the duration of the COVID-19 declaration under Section 564(b)(1) of the Act, 21 U.S.C. section 360bbb-3(b)(1), unless the authorization is terminated or revoked.  Performed at [facility], [place] [address] [place], [place] [address]   Surgical pcr screen     Status: None   Collection Time: 09/13/21 12:28 PM   Specimen: Nasal Mucosa; Nasal Swab  Result Value Ref Range Status   MRSA, PCR NEGATIVE NEGATIVE Final   Staphylococcus aureus NEGATIVE NEGATIVE Final    Comment: (NOTE) The Xpert SA Assay (FDA approved for NASAL specimens in patients 73 years of age and older), is one component of a comprehensive surveillance program. It is not intended to diagnose infection nor to guide or monitor treatment. Performed at [facility], [place] [address], [place], [place] [address]      Radiological Exams on Admission: MR THORACIC SPINE W WO CONTRAST  Result Date: 09/13/2021 CLINICAL DATA:  Thoracic osteomyelitis discitis. EXAM: MRI THORACIC AND LUMBAR SPINE WITHOUT AND WITH CONTRAST TECHNIQUE: Multiplanar and multiecho pulse sequences of the thoracic and lumbar spine were obtained without and with intravenous contrast. CONTRAST:  26mL GADAVIST GADOBUTROL 1 MMOL/ML IV SOLN COMPARISON:  MRI thoracic spine dated August 22, 2021. MRI thoracic and lumbar spine dated August 07, 2021. FINDINGS: MRI THORACIC SPINE FINDINGS Alignment:  Physiologic. Vertebrae: Improved T6-T7 discitis osteomyelitis with  decreased pus in the disc space. T7 superior endplate erosion has also improved. Intense vertebral body and posterior element marrow edema and enhancement from T5 through T8 is not significantly changed. Cord: Decreased posterior epidural phlegmon compared to prior study without significant residual rim enhancing fluid collection. Similar ventral epidural phlegmon from T5 through T7. Continued moderate spinal canal stenosis at T6-T7 due to epidural phlegmon. Paraspinal and other soft tissues: Small bilateral pleural effusions have decreased since the prior study. Patchy opacities in both lower lobes. Extensive paravertebral edema and enhancement from T5 through T9 is similar. Disc levels: No additional high-grade spinal canal or  neuroforaminal stenosis. MRI LUMBAR SPINE FINDINGS Segmentation:  Standard. Alignment:  Physiologic. Vertebrae: No fracture, evidence of discitis, or bone lesion. Similar acute on chronic degenerative endplate marrow signal changes at L5-S1 with minimal endplate enhancement and trace fluid in the disc space. Conus medullaris: Extends to the L1-L2 level and appears normal. No intradural enhancement. Paraspinal and other soft tissues: No paravertebral inflammatory changes. Similar bilateral paraspinous muscle edema without enhancement. Unchanged mild bilateral hydronephrosis. Unchanged complex left renal cyst. Disc levels: T12-L1: Unchanged tiny left paracentral disc protrusion. No stenosis. L1-L2:  Unchanged mild disc bulging. No stenosis. L2-L3: Unchanged tiny left paracentral disc protrusion and annular fissure. No stenosis. L3-L4:  Negative. L4-L5: Unchanged small broad-based posterior disc protrusion and mild bilateral facet arthropathy. Unchanged mild bilateral neuroforaminal stenosis. No spinal canal stenosis. L5-S1: Unchanged severe disc height loss, small circumferential disc osteophyte complex, and superimposed small central disc protrusion. Unchanged mild to moderate bilateral facet  arthropathy. Unchanged moderate right greater than left neuroforaminal stenosis. No spinal canal stenosis. IMPRESSION: Thoracic spine: 1. Continued interval improvement in T6-T7 discitis osteomyelitis with decreased pus in the disc space and improved erosion of the T7 superior endplate. Decreased posterior epidural phlegmon. Continued moderate spinal canal stenosis at T6-T7 due to epidural phlegmon. 2. Extensive marrow edema and enhancement involving the T5 through T8 vertebral bodies and posterior elements is not significantly changed and remains consistent with osteomyelitis. Extensive paravertebral edema and enhancement from T5 through T9 is also similar to prior study. 3. Small bilateral pleural effusions have decreased since the prior study. Patchy opacities in both lower lobes may be due to atelectasis or pneumonia. Lumbar spine: 1. No evidence of osteomyelitis-discitis. 2. Unchanged lumbar spondylosis as described above. Unchanged moderate right greater than left neuroforaminal stenosis at L5-S1. 3. Unchanged mild bilateral hydronephrosis. Electronically Signed   By: Titus Dubin M.D.   On: 09/13/2021 17:08   MR Lumbar Spine W Wo Contrast  Result Date: 09/13/2021 CLINICAL DATA:  Thoracic osteomyelitis discitis. EXAM: MRI THORACIC AND LUMBAR SPINE WITHOUT AND WITH CONTRAST TECHNIQUE: Multiplanar and multiecho pulse sequences of the thoracic and lumbar spine were obtained without and with intravenous contrast. CONTRAST:  71mL GADAVIST GADOBUTROL 1 MMOL/ML IV SOLN COMPARISON:  MRI thoracic spine dated August 22, 2021. MRI thoracic and lumbar spine dated August 07, 2021. FINDINGS: MRI THORACIC SPINE FINDINGS Alignment:  Physiologic. Vertebrae: Improved T6-T7 discitis osteomyelitis with decreased pus in the disc space. T7 superior endplate erosion has also improved. Intense vertebral body and posterior element marrow edema and enhancement from T5 through T8 is not significantly changed. Cord: Decreased  posterior epidural phlegmon compared to prior study without significant residual rim enhancing fluid collection. Similar ventral epidural phlegmon from T5 through T7. Continued moderate spinal canal stenosis at T6-T7 due to epidural phlegmon. Paraspinal and other soft tissues: Small bilateral pleural effusions have decreased since the prior study. Patchy opacities in both lower lobes. Extensive paravertebral edema and enhancement from T5 through T9 is similar. Disc levels: No additional high-grade spinal canal or neuroforaminal stenosis. MRI LUMBAR SPINE FINDINGS Segmentation:  Standard. Alignment:  Physiologic. Vertebrae: No fracture, evidence of discitis, or bone lesion. Similar acute on chronic degenerative endplate marrow signal changes at L5-S1 with minimal endplate enhancement and trace fluid in the disc space. Conus medullaris: Extends to the L1-L2 level and appears normal. No intradural enhancement. Paraspinal and other soft tissues: No paravertebral inflammatory changes. Similar bilateral paraspinous muscle edema without enhancement. Unchanged mild bilateral hydronephrosis. Unchanged complex left renal cyst. Disc levels: T12-L1:  Unchanged tiny left paracentral disc protrusion. No stenosis. L1-L2:  Unchanged mild disc bulging. No stenosis. L2-L3: Unchanged tiny left paracentral disc protrusion and annular fissure. No stenosis. L3-L4:  Negative. L4-L5: Unchanged small broad-based posterior disc protrusion and mild bilateral facet arthropathy. Unchanged mild bilateral neuroforaminal stenosis. No spinal canal stenosis. L5-S1: Unchanged severe disc height loss, small circumferential disc osteophyte complex, and superimposed small central disc protrusion. Unchanged mild to moderate bilateral facet arthropathy. Unchanged moderate right greater than left neuroforaminal stenosis. No spinal canal stenosis. IMPRESSION: Thoracic spine: 1. Continued interval improvement in T6-T7 discitis osteomyelitis with decreased pus  in the disc space and improved erosion of the T7 superior endplate. Decreased posterior epidural phlegmon. Continued moderate spinal canal stenosis at T6-T7 due to epidural phlegmon. 2. Extensive marrow edema and enhancement involving the T5 through T8 vertebral bodies and posterior elements is not significantly changed and remains consistent with osteomyelitis. Extensive paravertebral edema and enhancement from T5 through T9 is also similar to prior study. 3. Small bilateral pleural effusions have decreased since the prior study. Patchy opacities in both lower lobes may be due to atelectasis or pneumonia. Lumbar spine: 1. No evidence of osteomyelitis-discitis. 2. Unchanged lumbar spondylosis as described above. Unchanged moderate right greater than left neuroforaminal stenosis at L5-S1. 3. Unchanged mild bilateral hydronephrosis. Electronically Signed   By: Titus Dubin M.D.   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  Just recently hospitalized for a prolonged stay after presenting with septic shock secondary to infected diabetic foot wound with group B Streptococcus bacteremia and thoracic discitis.  No vegetation noted on TEE during cardioversion.  Neurosurgery had evaluated the patient during his last hospitalization and recommended continued antibiotics versus surgery.  Due to patient's complaints of worsening back painMRI was ordered, but patient required general anesthesia.  MRI today revealed continued improvement of the T6 -7 discitis osteomyelitis with decreased pus in the disc space, improved erosion of T7 superior endplate with decreased posterior epidural phlegmon, Continued moderate spinal canal stenosis of T6-7 due to epidural phlegmon with extensive marrow edema and effacement involving T5-8 vertebral  bodies consistent with osteomyelitis.  Patient was treated with 6 weeks of vancomycin completed on 09/01/2021, and transitioned to continue on oral cefadroxil 1000 mg bid til 1/23 per ID .  -Admit to a medical telemetry bed -Continue cefadroxil until 09/29/2021 -Pain regimen as seen below -Keep outpatient follow-up with Dr. Sherley Bounds was recommended in 3 weeks after discharge. -Keep outpatient follow-up arranged with Dr. Gale Journey on 10/07/2021  Leukocytosis  elevated CRP: Acute WBC was elevated at 11.7 and CRP 10.4.  Question possibility of worsening infection initially MRI came back.  Possibly related to recent missed doses of antibiotics. -Recheck blood cultures -Recheck CBC tomorrow morning  Back pain  chronic pain syndrome: Patient admitted due to concern for worsening pain while awaiting repeat MRI that noted possible provement in symptoms.  Pain regimen and discharge included oxycodone 40 mg twice daily and hydromorphone 2 mg every 4 hours as needed for severe pain.  While in the ED patient had been on Dilaudid IV every 4 hours as needed. -Resume previous po pain regimen  Heart failure with reduced EF: Patient appears to be euvolemic at this time.  Denies any complaints of shortness of breath.  TEE 11/22 noted EF to be 35-40%. -Strict intake and output -Daily weights -Continue metoprolol succinate  Sacral pressure wound: Noted to be infected during previous hospitalization on 12/8 and was treated with a total of 7 days of cefepime.  Wound care has been consulted during the last hospitalization and he was receiving Hydro therapy. -Low-air-loss mattress replacement -Continue trying to offload and getting patient out of bed -Hydrotherapy -Wound care consulted  Hyponatremia: Acute on chronic.  Sodium 130 on admission. -Recheck sodium levels tomorrow morning  Status post right below-knee amputation S/p left transtibial amputation on 07/23/2021: Secondary to diabetic foot infection.  Staples  were removed from the wound on 08/22/2021. -PT/OT  Diabetes mellitus type 2: Regimen prior to discharge included glargine 36 units twice daily and NovoLog 6 units with meals.  He had 1 episode of hypoglycemia while in the ED but suspect that that was secondary to patient being N.p.o. while trying to have MRI under anesthesia. -Hypoglycemic protocols -D50 as needed for low blood sugar -Change Levemir to Semglee 36 units twice daily -Continue NovoLog 6 units 3 times daily with meals -CBGs before every meal with sensitive sliding scale insulin  Paroxysmal atrial flutter on anticoagulation: Patient currently in normal sinus rhythm since DCCV 07/2021. -Continue amiodarone and Eliquis  Essential hypertension: Patient currently on metoprolol with blood pressures controlled. -Continue current regimen  Normocytic anemia: Hemoglobin 12.7 g/dL. -Follow-up repeat CBC tomorrow morning  Urinary retention: During prior hospitalization patient had retention type symptoms requiring repeated in and out caths. -Continue finasteride and tamsulosin  Insomnia -Continue trazodone  DVT prophylaxis: Eliquis Code Status: Full Family Communication: Family updated at bedside Disposition Plan: Patient would like to be discharged home Consults called: None Admission status: Observation  Norval Morton MD [facility]   If 7PM-7AM, please contact night-coverage   09/13/2021, 9:39 PM

## 2021-09-13 NOTE — ED Notes (Signed)
Patient transported to MRI 

## 2021-09-13 NOTE — Anesthesia Postprocedure Evaluation (Signed)
Anesthesia Post Note  Patient: [name]  Procedure(s) Performed: MRI WITH ANESTHESIA     Patient location during evaluation: PACU Anesthesia Type: General Level of consciousness: awake Pain management: pain level controlled Vital Signs Assessment: post-procedure vital signs reviewed and stable Respiratory status: spontaneous breathing, nonlabored ventilation, respiratory function stable and patient connected to nasal cannula oxygen Cardiovascular status: blood pressure returned to baseline and stable Postop Assessment: no apparent nausea or vomiting Anesthetic complications: no   No notable events documented.  Last Vitals:  Vitals:   09/13/21 1631 09/13/21 1647  BP: 131/75 132/79  Pulse: 90 95  Resp: 19 18  Temp:  37.1 C  SpO2: 94% 100%    Last Pain:  Vitals:   09/13/21 1647  TempSrc:   PainSc: 8                  Winnie Barsky P Oluwatamilore Starnes

## 2021-09-13 NOTE — ED Notes (Signed)
Pt linens changed

## 2021-09-13 NOTE — ED Notes (Signed)
Pt stated he is in a lot of pain. Notified EDP for medication.   Emptied pt urinal.

## 2021-09-13 NOTE — ED Provider Notes (Signed)
Assumed care of patient from Dr Armandina Gemma.  Briefly this is a 56 yo male presenting from SNF with concern for back pain, hx of epidural abscess, paraplegic, and hx of unstageable sacral decubitus ulcer.    Patient had initially planned for MRI spine to re-evaluate abscess, however he is requiring general anesthesia for his scan, as he reports unability to lay flat and claustrophobia.  Last night he reportedly ate food just prior to plans for anesthesia for MRI, despite being told by nursing not to eat.  Therefore anesthesia was postponed until noon today.  NPO status has been reinforced with patient.  SNF medications ordered by Dr Armandina Gemma.  Discharged from hospital 09/09/21 to SNF after complicated course including Group B strep bacteremia w/ septic shock 2/2 left diabetic food infection, transitioned to oral cefadroxi, left BKA by ortho on 11/16  MRI 08/22/21:   IMPRESSION: 1. Interval improvement in discitis and osteomyelitis centered at T6-7. Bone marrow edema and enhancement extends into T5 and T8. Posterior epidural abscess centered at T6-7 has improved. There remains moderate spinal stenosis with deformity of the cord due to stenosis from the abscess. This is similar in degree to the prior study. 2. No interval fracture. No new area of infection in the thoracic spine. 3. Small bilateral pleural effusions have improved in the interval.      Informed by [facility] team that the patient will not be accepted back to [facility] after he repeatedly called police on the facility.  Patient will need repeat PT evaluation and likely new SNF placement.  10 AM - I spoke to Dr Natale Milch (sp?) from Anesthesia who will evaluate and consent patient for anesthesia, planning for likely MRI around noon.    Patient admitted to hospitalist service with plan to f/u on MRI imaging.    Wyvonnia Dusky, MD 09/13/21 [id number]

## 2021-09-13 NOTE — ED Provider Notes (Signed)
[facility] EMERGENCY DEPARTMENT Provider Note   CSN: [id number] Arrival date & time: 09/12/21  1118     History  Chief Complaint  Patient presents with   Back Pain    [name] is a 56 y.o. male presenting from SNF with back pain.  Please see Dr Jake Shark note and discharge summary from 09/09/21 for recent medical workup and complications.    Assumed care of patient from Dr Armandina Gemma.  Briefly this is a 56 yo male presenting from SNF with concern for back pain, hx of epidural abscess, paraplegic, and hx of unstageable sacral decubitus ulcer.    Patient had initially planned for MRI spine to re-evaluate abscess, however he is requiring general anesthesia for his scan, as he reports unability to lay flat and claustrophobia.  Last night he reportedly ate food just prior to plans for anesthesia for MRI, despite being told by nursing not to eat.  Therefore anesthesia was postponed until noon today.  NPO status has been reinforced with patient.  SNF medications ordered by Dr Armandina Gemma.   Discharged from hospital 09/09/21 to SNF after complicated course including Group B strep bacteremia w/ septic shock 2/2 left diabetic food infection, transitioned to oral cefadroxi, left BKA by ortho on 11/16    HPI     Home Medications Prior to Admission medications   Medication Sig Start Date End Date Taking? Authorizing Provider  amiodarone (PACERONE) 200 MG tablet Take 1 tablet (200 mg total) by mouth daily. 09/10/21 10/10/21 Yes Vashti Hey, MD  apixaban (ELIQUIS) 5 MG TABS tablet Take 1 tablet (5 mg total) by mouth 2 (two) times daily. 09/09/21 10/09/21 Yes Vashti Hey, MD  cefadroxil (DURICEF) 500 MG capsule Take 2 capsules (1,000 mg total) by mouth 2 (two) times daily for 44 doses. Patient taking differently: Take 1,000 mg by mouth See admin instructions. Bid x 44 doses 09/09/21 10/01/21 Yes Chatterjee, Kyra Searles, MD  Ensure Plus (ENSURE PLUS) LIQD Take 237  mLs by mouth 2 (two) times daily between meals.   Yes [provider]  finasteride (PROSCAR) 5 MG tablet Take 1 tablet (5 mg total) by mouth daily. 09/10/21 10/10/21 Yes Vashti Hey, MD  gabapentin (NEURONTIN) 400 MG capsule Take 1 capsule (400 mg total) by mouth 3 (three) times daily. 09/09/21 10/09/21 Yes Bonnell Public Tublu, MD  insulin aspart (NOVOLOG) 100 UNIT/ML injection Inject 6 Units into the skin 3 (three) times daily with meals. 09/09/21  Yes Bonnell Public Tublu, MD  insulin detemir (LEVEMIR) 100 UNIT/ML injection Inject 0.36 mLs (36 Units total) into the skin 2 (two) times daily. 09/09/21  Yes Vashti Hey, MD  Insulin Glargine (LANTUS SOLOSTAR [place]) Inject 36 Units into the skin in the morning and at bedtime.   Yes [provider]  liver oil-zinc oxide (DESITIN) 40 % ointment Apply topically daily as needed for irritation. Patient taking differently: Apply 1 application topically every 6 (six) hours as needed for irritation. 09/09/21  Yes Vashti Hey, MD  magnesium hydroxide (MILK OF MAGNESIA) 400 MG/5ML suspension Take 30 mLs by mouth daily as needed for mild constipation.   Yes [provider]  methocarbamol 1000 MG TABS Take 1,000 mg by mouth 3 (three) times daily. 09/09/21 10/09/21 Yes Vashti Hey, MD  metoprolol succinate (TOPROL-XL) 25 MG 24 hr tablet Take 1 tablet (25 mg total) by mouth daily. 09/10/21 10/10/21 Yes Vashti Hey, MD  Multiple Vitamin (MULTIVITAMIN WITH MINERALS) TABS tablet Take 1  tablet by mouth daily. 09/10/21 10/10/21 Yes Vashti Hey, MD  Oxycodone HCl 10 MG TABS Take 10 mg by mouth every 8 (eight) hours as needed (chronic pain).   Yes [provider]  sodium hypochlorite (DAKIN'S 1/4 STRENGTH) 0.125 % SOLN Irrigate with as directed daily for 10 days. Patient taking differently: Irrigate with 1 application as directed daily. 09/10/21 09/20/21 Yes Vashti Hey, MD  tamsulosin (FLOMAX) 0.4 MG CAPS capsule Take 1 capsule (0.4 mg total) by mouth daily. 09/10/21 10/10/21 Yes Vashti Hey, MD  testosterone (ANDROGEL) 50 MG/5GM (1%) GEL Place 5 g onto the skin daily. 09/10/21 10/10/21 Yes Vashti Hey, MD  testosterone (ANDROGEL) 50 MG/5GM (1%) GEL Place 5 g onto the skin daily as needed (hypogonadism).   Yes [provider]  traZODone (DESYREL) 150 MG tablet Take 1 tablet (150 mg total) by mouth at bedtime as needed for up to 15 days for sleep. 09/09/21 09/24/21 Yes Vashti Hey, MD  Chlorhexidine Gluconate Cloth 2 % PADS Apply 6 each topically daily for 10 days. Patient not taking: Reported on 09/13/2021 09/10/21 09/20/21  Vashti Hey, MD  collagenase (SANTYL) ointment Apply topically daily. Patient not taking: Reported on 09/13/2021 09/10/21   Vashti Hey, MD  Ensure Max Protein (ENSURE MAX PROTEIN) LIQD Take 330 mLs (11 oz total) by mouth 2 (two) times daily between meals for 10 days. Patient not taking: Reported on 09/13/2021 09/09/21 09/19/21  Vashti Hey, MD  HYDROcodone-acetaminophen (NORCO/VICODIN) 5-325 MG tablet Take 1 tablet by mouth once. Patient not taking: Reported on 09/13/2021    [provider]  nystatin cream (MYCOSTATIN) Apply topically 2 (two) times daily. Patient not taking: Reported on 09/13/2021 09/09/21   Vashti Hey, MD  oxyCODONE-acetaminophen (PERCOCET/ROXICET) 5-325 MG tablet Take 2 tablets by mouth once. Patient not taking: Reported on 09/13/2021    [provider]      Allergies    Bactrim [sulfamethoxazole-trimethoprim], Ceprotin [protein c concentrate (human)], Ciprofloxacin, and Levaquin [levofloxacin]    Review of Systems   Review of Systems  Physical Exam Updated Vital Signs BP 129/76    Pulse 90    Temp 98.5 F (36.9 C)    Resp 18    SpO2 96%  Physical Exam HENT:     Head: Normocephalic and atraumatic.   Cardiovascular:     Rate and Rhythm: Normal rate and regular rhythm.  Pulmonary:     Effort: Pulmonary effort is normal. No respiratory distress.  Skin:    General: Skin is warm and dry.  Neurological:     Mental Status: He is alert and oriented to person, place, and time.    ED Results / Procedures / Treatments   Labs (all labs ordered are listed, but only abnormal results are displayed) Labs Reviewed  CBC WITH DIFFERENTIAL/PLATELET - Abnormal; Notable for the following components:      Result Value   WBC 11.7 (*)    Hemoglobin 12.7 (*)    HCT 38.4 (*)    Neutro Abs 10.1 (*)    Lymphs Abs 0.5 (*)    Abs Immature Granulocytes 0.14 (*)    All other components within normal limits  BASIC METABOLIC PANEL - Abnormal; Notable for the following components:   Sodium 130 (*)    Chloride 97 (*)    Glucose, Bld 264 (*)    Calcium 8.8 (*)    All other components within normal limits  SEDIMENTATION RATE - Abnormal; Notable  for the following components:   Sed Rate 81 (*)    All other components within normal limits  C-REACTIVE PROTEIN - Abnormal; Notable for the following components:   CRP 10.4 (*)    All other components within normal limits  CBG MONITORING, ED - Abnormal; Notable for the following components:   Glucose-Capillary 263 (*)    All other components within normal limits  CBG MONITORING, ED - Abnormal; Notable for the following components:   Glucose-Capillary 224 (*)    All other components within normal limits  RESP PANEL BY RT-PCR (FLU A&B, COVID) [id number]  SURGICAL PCR SCREEN    EKG None  Radiology No results found.  Procedures Procedures    Medications Ordered in ED Medications  LORazepam (ATIVAN) tablet 0.5 mg (has no administration in time range)  LORazepam (ATIVAN) injection 0.5 mg (has no administration in time range)  apixaban (ELIQUIS) tablet 5 mg (0 mg Oral Hold 09/13/21 1116)  insulin aspart (novoLOG) injection 6 Units (6 Units Subcutaneous Not  Given 09/13/21 0839)  insulin detemir (LEVEMIR) injection 36 Units (36 Units Subcutaneous Given 09/13/21 0955)  metoprolol succinate (TOPROL-XL) 24 hr tablet 25 mg (0 mg Oral Hold 09/13/21 1116)  traZODone (DESYREL) tablet 150 mg (has no administration in time range)  cefadroxil (DURICEF) capsule 1,000 mg (0 mg Oral Hold 09/13/21 1116)  HYDROmorphone (DILAUDID) injection 1 mg (1 mg Intravenous Incomplete 09/13/21 1142)  lactated ringers infusion ( Intravenous New Bag/Given 09/13/21 1248)  HYDROmorphone (DILAUDID) injection 0.5 mg (0.5 mg Intramuscular Given 09/12/21 1208)  LORazepam (ATIVAN) injection 1 mg (1 mg Intravenous Given 09/13/21 0144)  HYDROmorphone (DILAUDID) injection 1 mg (1 mg Intravenous Given 09/13/21 0042)  HYDROmorphone (DILAUDID) injection 1 mg (1 mg Intravenous Given 09/13/21 0514)  chlorhexidine (PERIDEX) 0.12 % solution 15 mL (15 mLs Mouth/Throat Given 09/13/21 1248)    ED Course/ Medical Decision Making/ A&P                           Medical Decision Making  Requesting repeat MRI to re-evaluate epidural abscess noted on prior imaging, as patient is here complaining of worsening back pain.  Labs reviewed from intake - no clear evidence of sepsis at this time.  His vitals have been stable in the ED overnight.  Patient requiring general anesthesia for MRI.  Anesthesia team consulted.  Home/SNF medications reordered here.  Likely requiring medical admission regardless for repeat PT evaluation and new SNF placement, as he is reportedly not welcomed back at his prior SNF facility.    See separate note for additional documentation        Final Clinical Impression(s) / ED Diagnoses Final diagnoses:  None    Rx / DC Orders ED Discharge Orders     None         Dyamond Tolosa, Carola Rhine, MD 09/13/21 1308

## 2021-09-13 NOTE — Anesthesia Preprocedure Evaluation (Addendum)
Anesthesia Evaluation  Patient identified by MRN, date of birth, ID band Patient awake    Reviewed: Allergy & Precautions, NPO status , Patient's Chart, lab work & pertinent test results  Airway Mallampati: III  TM Distance: >3 FB Neck ROM: Full    Dental no notable dental hx.    Pulmonary neg pulmonary ROS,    Pulmonary exam normal breath sounds clear to auscultation       Cardiovascular hypertension, Pt. on home beta blockers +CHF  Normal cardiovascular exam Rhythm:Regular Rate:Normal  ECHO: 1. Left ventricular ejection fraction, by estimation, is 35 to 40%. The left ventricle has moderately decreased function. The left ventricle demonstrates global hypokinesis. 2. Right ventricular systolic function is moderately reduced. The right ventricular size is normal. 3. Left atrial size was mildly dilated. No left atrial/left atrial appendage thrombus was detected. 4. Right atrial size was mildly dilated. 5. The mitral valve is normal in structure. Trivial mitral valve regurgitation. No evidence of mitral stenosis. 6. The aortic valve is normal in structure. Aortic valve regurgitation is trivial. No aortic stenosis is present. 7. The inferior vena cava is normal in size with greater than 50% respiratory variability, suggesting right atrial pressure of 3 mmHg. 8. Cannot exclude a small PFO   Neuro/Psych PSYCHIATRIC DISORDERS Anxiety Depression  Neuromuscular disease    GI/Hepatic negative GI ROS, Neg liver ROS,   Endo/Other  diabetes, Insulin Dependent  Renal/GU Renal disease     Musculoskeletal  (+) Arthritis ,   Abdominal   Peds  Hematology  (+) anemia ,   Anesthesia Other Findings EPIDURAL ABSCESS  Reproductive/Obstetrics                            Anesthesia Physical Anesthesia Plan  ASA: 3  Anesthesia Plan: General   Post-op Pain Management:    Induction: Intravenous  PONV Risk  Score and Plan: 2 and Ondansetron and Treatment may vary due to age or medical condition  Airway Management Planned: Oral ETT  Additional Equipment:   Intra-op Plan:   Post-operative Plan: Extubation in OR  Informed Consent: I have reviewed the patients History and Physical, chart, labs and discussed the procedure including the risks, benefits and alternatives for the proposed anesthesia with the patient or authorized representative who has indicated his/her understanding and acceptance.     Dental advisory given  Plan Discussed with: CRNA  Anesthesia Plan Comments:        Anesthesia Quick Evaluation

## 2021-09-13 NOTE — ED Notes (Signed)
Notified EDP if pt was receiving short acting insulin. Provider stated to hold short acting and give long acting.

## 2021-09-13 NOTE — H&P (Incomplete)
History and Physical    [name] FVC:[id number] DOB: 1966-06-24 DOA: 09/12/2021  Referring MD/NP/PA:  PCP: Patient, No Pcp Per (Inactive)  Patient coming from: Via EMS  Chief Complaint: ***  I have personally briefly reviewed patient's old medical records in [place]   HPI: [name] is a 56 y.o. male with medical history significant of   ED Course: Upon admission into the emergency department patient was noted to have temperature 99 F, pulse 78-1 09, respiration 15-23, blood pressures 122/82-168/102, and O2 saturation maintained on room air.  Labs significant for WBC 11.7, hemoglobin 12.7, sodium 130, glucose 264.  ROS  Past Medical History:  Diagnosis Date   Chronic diastolic CHF (congestive heart failure) ([place])    Complication of anesthesia    Depression    Diabetes mellitus without complication ([place])    Hypertension     Past Surgical History:  Procedure Laterality Date   AMPUTATION Left 07/23/2021   Procedure: LEFT BELOW KNEE AMPUTATION;  Surgeon: [name], MD;  Location: [place];  Service: Orthopedics;  Laterality: Left;   BELOW KNEE LEG AMPUTATION Right    BUBBLE STUDY  07/29/2021   Procedure: BUBBLE STUDY;  Surgeon: Sueanne Margarita, MD;  Location: [place];  Service: Cardiovascular;;   CARDIOVERSION N/A 07/29/2021   Procedure: CARDIOVERSION;  Surgeon: Sueanne Margarita, MD;  Location: [facility] ENDOSCOPY;  Service: Cardiovascular;  Laterality: N/A;   RADIOLOGY WITH ANESTHESIA N/A 08/05/2021   Procedure: MRI LUMBAR WITH AND WITHOUT; THORASIC SPINE WITH AND WITHOUT WITH ANESTHESIA;  Surgeon: Radiologist, Medication, MD;  Location: [place];  Service: Radiology;  Laterality: N/A;   RADIOLOGY WITH ANESTHESIA N/A 08/07/2021   Procedure: MRI WITH LUMBER WITH AND WITHOUT CONTRAST,THORACIC WITH AND WITHOUT CONTRAST;  Surgeon: Radiologist, Medication, MD;  Location: [place];  Service: Radiology;  Laterality: N/A;   TEE WITHOUT CARDIOVERSION N/A 07/29/2021    Procedure: TRANSESOPHAGEAL ECHOCARDIOGRAM (TEE);  Surgeon: Sueanne Margarita, MD;  Location: [facility] ENDOSCOPY;  Service: Cardiovascular;  Laterality: N/A;     reports that he has never smoked. He has never used smokeless tobacco. He reports that he does not drink alcohol and does not use drugs.  Allergies  Allergen Reactions   Bactrim [Sulfamethoxazole-Trimethoprim]    Ceprotin [Protein C Concentrate (Human)]    Ciprofloxacin Other (See Comments)    Kidney function   Levaquin [Levofloxacin]     Family History  Problem Relation Age of Onset   Anxiety disorder Mother    Cancer Father    Coronary artery disease Father     Prior to Admission medications   Medication Sig Start Date End Date Taking? Authorizing Provider  amiodarone (PACERONE) 200 MG tablet Take 1 tablet (200 mg total) by mouth daily. 09/10/21 10/10/21 Yes Vashti Hey, MD  apixaban (ELIQUIS) 5 MG TABS tablet Take 1 tablet (5 mg total) by mouth 2 (two) times daily. 09/09/21 10/09/21 Yes Vashti Hey, MD  cefadroxil (DURICEF) 500 MG capsule Take 2 capsules (1,000 mg total) by mouth 2 (two) times daily for 44 doses. Patient taking differently: Take 1,000 mg by mouth See admin instructions. Bid x 44 doses 09/09/21 10/01/21 Yes Chatterjee, Kyra Searles, MD  Ensure Plus (ENSURE PLUS) LIQD Take 237 mLs by mouth 2 (two) times daily between meals.   Yes [provider]  finasteride (PROSCAR) 5 MG tablet Take 1 tablet (5 mg total) by mouth daily. 09/10/21 10/10/21 Yes Vashti Hey, MD  gabapentin (NEURONTIN) 400 MG capsule Take 1  capsule (400 mg total) by mouth 3 (three) times daily. 09/09/21 10/09/21 Yes Bonnell Public Tublu, MD  insulin aspart (NOVOLOG) 100 UNIT/ML injection Inject 6 Units into the skin 3 (three) times daily with meals. 09/09/21  Yes Bonnell Public Tublu, MD  insulin detemir (LEVEMIR) 100 UNIT/ML injection Inject 0.36 mLs (36 Units total) into the skin 2 (two) times daily. 09/09/21  Yes  Vashti Hey, MD  Insulin Glargine (LANTUS SOLOSTAR [place]) Inject 36 Units into the skin in the morning and at bedtime.   Yes [provider]  liver oil-zinc oxide (DESITIN) 40 % ointment Apply topically daily as needed for irritation. Patient taking differently: Apply 1 application topically every 6 (six) hours as needed for irritation. 09/09/21  Yes Vashti Hey, MD  magnesium hydroxide (MILK OF MAGNESIA) 400 MG/5ML suspension Take 30 mLs by mouth daily as needed for mild constipation.   Yes [provider]  methocarbamol 1000 MG TABS Take 1,000 mg by mouth 3 (three) times daily. 09/09/21 10/09/21 Yes Vashti Hey, MD  metoprolol succinate (TOPROL-XL) 25 MG 24 hr tablet Take 1 tablet (25 mg total) by mouth daily. 09/10/21 10/10/21 Yes Vashti Hey, MD  Multiple Vitamin (MULTIVITAMIN WITH MINERALS) TABS tablet Take 1 tablet by mouth daily. 09/10/21 10/10/21 Yes Vashti Hey, MD  Oxycodone HCl 10 MG TABS Take 10 mg by mouth every 8 (eight) hours as needed (chronic pain).   Yes [provider]  sodium hypochlorite (DAKIN'S 1/4 STRENGTH) 0.125 % SOLN Irrigate with as directed daily for 10 days. Patient taking differently: Irrigate with 1 application as directed daily. 09/10/21 09/20/21 Yes Vashti Hey, MD  tamsulosin (FLOMAX) 0.4 MG CAPS capsule Take 1 capsule (0.4 mg total) by mouth daily. 09/10/21 10/10/21 Yes Vashti Hey, MD  testosterone (ANDROGEL) 50 MG/5GM (1%) GEL Place 5 g onto the skin daily. 09/10/21 10/10/21 Yes Vashti Hey, MD  testosterone (ANDROGEL) 50 MG/5GM (1%) GEL Place 5 g onto the skin daily as needed (hypogonadism).   Yes [provider]  traZODone (DESYREL) 150 MG tablet Take 1 tablet (150 mg total) by mouth at bedtime as needed for up to 15 days for sleep. 09/09/21 09/24/21 Yes Vashti Hey, MD  Chlorhexidine Gluconate Cloth 2 % PADS Apply 6 each topically  daily for 10 days. Patient not taking: Reported on 09/13/2021 09/10/21 09/20/21  Vashti Hey, MD  collagenase (SANTYL) ointment Apply topically daily. Patient not taking: Reported on 09/13/2021 09/10/21   Vashti Hey, MD  Ensure Max Protein (ENSURE MAX PROTEIN) LIQD Take 330 mLs (11 oz total) by mouth 2 (two) times daily between meals for 10 days. Patient not taking: Reported on 09/13/2021 09/09/21 09/19/21  Vashti Hey, MD  HYDROcodone-acetaminophen (NORCO/VICODIN) 5-325 MG tablet Take 1 tablet by mouth once. Patient not taking: Reported on 09/13/2021    [provider]  nystatin cream (MYCOSTATIN) Apply topically 2 (two) times daily. Patient not taking: Reported on 09/13/2021 09/09/21   Vashti Hey, MD  oxyCODONE-acetaminophen (PERCOCET/ROXICET) 5-325 MG tablet Take 2 tablets by mouth once. Patient not taking: Reported on 09/13/2021    [provider]    Physical Exam:  Constitutional: NAD, calm, comfortable Vitals:   09/13/21 0700 09/13/21 0745 09/13/21 0800 09/13/21 1236  BP: 129/87 126/80 129/76   Pulse: 93 96 90   Resp:  18 18   Temp:    98.5 F (36.9 C)  TempSrc:      SpO2: 94% 97% 96%  Eyes: PERRL, lids and conjunctivae normal ENMT: Mucous membranes are moist. Posterior pharynx clear of any exudate or lesions.Normal dentition.  Neck: normal, supple, no masses, no thyromegaly Respiratory: clear to auscultation bilaterally, no wheezing, no crackles. Normal respiratory effort. No accessory muscle use.  Cardiovascular: Regular rate and rhythm, no murmurs / rubs / gallops. No extremity edema. 2+ pedal pulses. No carotid bruits.  Abdomen: no tenderness, no masses palpated. No hepatosplenomegaly. Bowel sounds positive.  Musculoskeletal: no clubbing / cyanosis. No joint deformity upper and lower extremities. Good ROM, no contractures. Normal muscle tone.  Skin: no rashes, lesions, ulcers. No induration Neurologic: CN 2-12  grossly intact. Sensation intact, DTR normal. Strength 5/5 in all 4.  Psychiatric: Normal judgment and insight. Alert and oriented x 3. Normal mood.     Labs on Admission: I have personally reviewed following labs and imaging studies  CBC: Recent Labs  Lab 09/07/21 0424 09/13/21 0034  WBC 8.4 11.7*  NEUTROABS  --  10.1*  HGB 11.4* 12.7*  HCT 35.4* 38.4*  MCV 90.8 88.7  PLT 233 092   Basic Metabolic Panel: Recent Labs  Lab 09/07/21 0424 09/13/21 0034  NA 134* 130*  K 4.1 4.0  CL 98 97*  CO2 26 22  GLUCOSE 122* 264*  BUN 22* 14  CREATININE 1.24 1.09  CALCIUM 8.7* 8.8*   GFR: CrCl cannot be calculated (Unknown ideal weight.). Liver Function Tests: No results for input(s): AST, ALT, ALKPHOS, BILITOT, PROT, ALBUMIN in the last 168 hours. No results for input(s): LIPASE, AMYLASE in the last 168 hours. No results for input(s): AMMONIA in the last 168 hours. Coagulation Profile: No results for input(s): INR, PROTIME in the last 168 hours. Cardiac Enzymes: No results for input(s): CKTOTAL, CKMB, CKMBINDEX, TROPONINI in the last 168 hours. BNP (last 3 results) No results for input(s): PROBNP in the last 8760 hours. HbA1C: No results for input(s): HGBA1C in the last 72 hours. CBG: Recent Labs  Lab 09/09/21 1149 09/09/21 1712 09/09/21 2112 09/13/21 0803 09/13/21 1142  GLUCAP 195* 181* 113* 263* 224*   Lipid Profile: No results for input(s): CHOL, HDL, LDLCALC, TRIG, CHOLHDL, LDLDIRECT in the last 72 hours. Thyroid Function Tests: No results for input(s): TSH, T4TOTAL, FREET4, T3FREE, THYROIDAB in the last 72 hours. Anemia Panel: No results for input(s): VITAMINB12, FOLATE, FERRITIN, TIBC, IRON, RETICCTPCT in the last 72 hours. Urine analysis:    Component Value Date/Time   COLORURINE STRAW (A) 09/05/2021 1245   APPEARANCEUR TURBID (A) 09/05/2021 1245   LABSPEC 1.011 09/05/2021 0017   PHURINE 7.0 09/05/2021 0017   GLUCOSEU NEGATIVE 09/05/2021 0017   HGBUR  MODERATE (A) 09/05/2021 0017   BILIRUBINUR NEGATIVE 09/05/2021 0017   KETONESUR NEGATIVE 09/05/2021 0017   PROTEINUR 100 (A) 09/05/2021 0017   UROBILINOGEN 0.2 09/13/2019 0906   NITRITE POSITIVE (A) 09/05/2021 0017   LEUKOCYTESUR LARGE (A) 09/05/2021 0017   Sepsis Labs: Recent Results (from the past 240 hour(s))  Resp Panel by RT-PCR (Flu A&B, Covid) Nasopharyngeal Swab     Status: None   Collection Time: 09/06/21  8:56 AM   Specimen: Nasopharyngeal Swab; Nasopharyngeal(NP) swabs in vial transport medium  Result Value Ref Range Status   SARS Coronavirus 2 by RT PCR NEGATIVE NEGATIVE Final    Comment: (NOTE) SARS-CoV-2 target nucleic acids are NOT DETECTED.  The SARS-CoV-2 RNA is generally detectable in upper respiratory specimens during the acute phase of infection. The lowest concentration of SARS-CoV-2 viral copies this assay can detect is 138 copies/mL.  A negative result does not preclude SARS-Cov-2 infection and should not be used as the sole basis for treatment or other patient management decisions. A negative result may occur with  improper specimen collection/handling, submission of specimen other than nasopharyngeal swab, presence of viral mutation(s) within the areas targeted by this assay, and inadequate number of viral copies(<138 copies/mL). A negative result must be combined with clinical observations, patient history, and epidemiological information. The expected result is Negative.  Fact Sheet for Patients:  EntrepreneurPulse.com.au  Fact Sheet for Healthcare Providers:  IncredibleEmployment.be  This test is no t yet approved or cleared by the [place] FDA and  has been authorized for detection and/or diagnosis of SARS-CoV-2 by FDA under an Emergency Use Authorization (EUA). This EUA will remain  in effect (meaning this test can be used) for the duration of the COVID-19 declaration under Section 564(b)(1) of the Act,  21 U.S.C.section 360bbb-3(b)(1), unless the authorization is terminated  or revoked sooner.       Influenza A by PCR NEGATIVE NEGATIVE Final   Influenza B by PCR NEGATIVE NEGATIVE Final    Comment: (NOTE) The Xpert Xpress SARS-CoV-2/FLU/RSV plus assay is intended as an aid in the diagnosis of influenza from Nasopharyngeal swab specimens and should not be used as a sole basis for treatment. Nasal washings and aspirates are unacceptable for Xpert Xpress SARS-CoV-2/FLU/RSV testing.  Fact Sheet for Patients: EntrepreneurPulse.com.au  Fact Sheet for Healthcare Providers: IncredibleEmployment.be  This test is not yet approved or cleared by the [place] FDA and has been authorized for detection and/or diagnosis of SARS-CoV-2 by FDA under an Emergency Use Authorization (EUA). This EUA will remain in effect (meaning this test can be used) for the duration of the COVID-19 declaration under Section 564(b)(1) of the Act, 21 U.S.C. section 360bbb-3(b)(1), unless the authorization is terminated or revoked.  Performed at [facility], [place] [address] [place], [place] [address]   Resp Panel by RT-PCR (Flu A&B, Covid) Nasopharyngeal Swab     Status: None   Collection Time: 09/09/21  3:53 PM   Specimen: Nasopharyngeal Swab; Nasopharyngeal(NP) swabs in vial transport medium  Result Value Ref Range Status   SARS Coronavirus 2 by RT PCR NEGATIVE NEGATIVE Final    Comment: (NOTE) SARS-CoV-2 target nucleic acids are NOT DETECTED.  The SARS-CoV-2 RNA is generally detectable in upper respiratory specimens during the acute phase of infection. The lowest concentration of SARS-CoV-2 viral copies this assay can detect is 138 copies/mL. A negative result does not preclude SARS-Cov-2 infection and should not be used as the sole basis for treatment or other patient management decisions. A negative result may occur with  improper specimen  collection/handling, submission of specimen other than nasopharyngeal swab, presence of viral mutation(s) within the areas targeted by this assay, and inadequate number of viral copies(<138 copies/mL). A negative result must be combined with clinical observations, patient history, and epidemiological information. The expected result is Negative.  Fact Sheet for Patients:  EntrepreneurPulse.com.au  Fact Sheet for Healthcare Providers:  IncredibleEmployment.be  This test is no t yet approved or cleared by the [place] FDA and  has been authorized for detection and/or diagnosis of SARS-CoV-2 by FDA under an Emergency Use Authorization (EUA). This EUA will remain  in effect (meaning this test can be used) for the duration of the COVID-19 declaration under Section 564(b)(1) of the Act, 21 U.S.C.section 360bbb-3(b)(1), unless the authorization is terminated  or revoked sooner.       Influenza A  by PCR NEGATIVE NEGATIVE Final   Influenza B by PCR NEGATIVE NEGATIVE Final    Comment: (NOTE) The Xpert Xpress SARS-CoV-2/FLU/RSV plus assay is intended as an aid in the diagnosis of influenza from Nasopharyngeal swab specimens and should not be used as a sole basis for treatment. Nasal washings and aspirates are unacceptable for Xpert Xpress SARS-CoV-2/FLU/RSV testing.  Fact Sheet for Patients: EntrepreneurPulse.com.au  Fact Sheet for Healthcare Providers: IncredibleEmployment.be  This test is not yet approved or cleared by the [place] FDA and has been authorized for detection and/or diagnosis of SARS-CoV-2 by FDA under an Emergency Use Authorization (EUA). This EUA will remain in effect (meaning this test can be used) for the duration of the COVID-19 declaration under Section 564(b)(1) of the Act, 21 U.S.C. section 360bbb-3(b)(1), unless the authorization is terminated or revoked.  Performed at [facility], [place] [address] [place], [place] [address]   Resp Panel by RT-PCR (Flu A&B, Covid) Nasopharyngeal Swab     Status: None   Collection Time: 09/13/21 12:34 AM   Specimen: Nasopharyngeal Swab; Nasopharyngeal(NP) swabs in vial transport medium  Result Value Ref Range Status   SARS Coronavirus 2 by RT PCR NEGATIVE NEGATIVE Final    Comment: (NOTE) SARS-CoV-2 target nucleic acids are NOT DETECTED.  The SARS-CoV-2 RNA is generally detectable in upper respiratory specimens during the acute phase of infection. The lowest concentration of SARS-CoV-2 viral copies this assay can detect is 138 copies/mL. A negative result does not preclude SARS-Cov-2 infection and should not be used as the sole basis for treatment or other patient management decisions. A negative result may occur with  improper specimen collection/handling, submission of specimen other than nasopharyngeal swab, presence of viral mutation(s) within the areas targeted by this assay, and inadequate number of viral copies(<138 copies/mL). A negative result must be combined with clinical observations, patient history, and epidemiological information. The expected result is Negative.  Fact Sheet for Patients:  EntrepreneurPulse.com.au  Fact Sheet for Healthcare Providers:  IncredibleEmployment.be  This test is no t yet approved or cleared by the [place] FDA and  has been authorized for detection and/or diagnosis of SARS-CoV-2 by FDA under an Emergency Use Authorization (EUA). This EUA will remain  in effect (meaning this test can be used) for the duration of the COVID-19 declaration under Section 564(b)(1) of the Act, 21 U.S.C.section 360bbb-3(b)(1), unless the authorization is terminated  or revoked sooner.       Influenza A by PCR NEGATIVE NEGATIVE Final   Influenza B by PCR NEGATIVE NEGATIVE Final    Comment: (NOTE) The Xpert Xpress SARS-CoV-2/FLU/RSV plus assay is  intended as an aid in the diagnosis of influenza from Nasopharyngeal swab specimens and should not be used as a sole basis for treatment. Nasal washings and aspirates are unacceptable for Xpert Xpress SARS-CoV-2/FLU/RSV testing.  Fact Sheet for Patients: EntrepreneurPulse.com.au  Fact Sheet for Healthcare Providers: IncredibleEmployment.be  This test is not yet approved or cleared by the [place] FDA and has been authorized for detection and/or diagnosis of SARS-CoV-2 by FDA under an Emergency Use Authorization (EUA). This EUA will remain in effect (meaning this test can be used) for the duration of the COVID-19 declaration under Section 564(b)(1) of the Act, 21 U.S.C. section 360bbb-3(b)(1), unless the authorization is terminated or revoked.  Performed at [facility], [place] [address], [place], [place] [address]      Radiological Exams on Admission: No results found.  EKG: Independently reviewed. ***  Assessment/Plan Principal Problem:   Back pain   ***   DVT prophylaxis: ***   Code Status: ***  Family Communication: ***  Disposition Plan: ***  Consults called: ***  Admission status: ***  Norval Morton MD [facility]   If 7PM-7AM, please contact night-coverage   09/13/2021, 1:42 PM

## 2021-09-13 NOTE — ED Notes (Signed)
Secure chat sent to Reather Littler RN, for pt report.

## 2021-09-14 ENCOUNTER — Encounter (HOSPITAL_COMMUNITY): Payer: Self-pay | Admitting: Interventional Radiology

## 2021-09-14 DIAGNOSIS — I5022 Chronic systolic (congestive) heart failure: Secondary | ICD-10-CM

## 2021-09-14 DIAGNOSIS — G4700 Insomnia, unspecified: Secondary | ICD-10-CM

## 2021-09-14 DIAGNOSIS — E291 Testicular hypofunction: Secondary | ICD-10-CM

## 2021-09-14 DIAGNOSIS — G8929 Other chronic pain: Secondary | ICD-10-CM

## 2021-09-14 DIAGNOSIS — B951 Streptococcus, group B, as the cause of diseases classified elsewhere: Secondary | ICD-10-CM | POA: Diagnosis not present

## 2021-09-14 DIAGNOSIS — R7881 Bacteremia: Secondary | ICD-10-CM | POA: Diagnosis not present

## 2021-09-14 LAB — CBC
HCT: 34.9 % — ABNORMAL LOW (ref 39.0–52.0)
Hemoglobin: 11.6 g/dL — ABNORMAL LOW (ref 13.0–17.0)
MCH: 29.4 pg (ref 26.0–34.0)
MCHC: 33.2 g/dL (ref 30.0–36.0)
MCV: 88.6 fL (ref 80.0–100.0)
Platelets: 337 10*3/uL (ref 150–400)
RBC: 3.94 MIL/uL — ABNORMAL LOW (ref 4.22–5.81)
RDW: 14 % (ref 11.5–15.5)
WBC: 11.2 10*3/uL — ABNORMAL HIGH (ref 4.0–10.5)
nRBC: 0 % (ref 0.0–0.2)

## 2021-09-14 LAB — BASIC METABOLIC PANEL
Anion gap: 12 (ref 5–15)
BUN: 23 mg/dL — ABNORMAL HIGH (ref 6–20)
CO2: 23 mmol/L (ref 22–32)
Calcium: 8.4 mg/dL — ABNORMAL LOW (ref 8.9–10.3)
Chloride: 95 mmol/L — ABNORMAL LOW (ref 98–111)
Creatinine, Ser: 1.22 mg/dL (ref 0.61–1.24)
GFR, Estimated: >60 mL/min (ref >60)
Glucose, Bld: 412 mg/dL — ABNORMAL HIGH (ref 70–99)
Potassium: 4.2 mmol/L (ref 3.5–5.1)
Sodium: 130 mmol/L — ABNORMAL LOW (ref 135–145)

## 2021-09-14 LAB — GLUCOSE, CAPILLARY
Glucose-Capillary: 435 mg/dL — ABNORMAL HIGH (ref 70–99)
Glucose-Capillary: 170 mg/dL — ABNORMAL HIGH (ref 70–99)
Glucose-Capillary: 212 mg/dL — ABNORMAL HIGH (ref 70–99)
Glucose-Capillary: 242 mg/dL — ABNORMAL HIGH (ref 70–99)

## 2021-09-14 MED ORDER — TESTOSTERONE 50 MG/5GM (1%) TD GEL
5.0000 g | Freq: Every day | TRANSDERMAL | Status: DC
  Administered 2021-09-14 – 2021-09-19 (×6): 5 g via TRANSDERMAL
  Filled 2021-09-14 (×6): qty 5

## 2021-09-14 MED ORDER — HYDROMORPHONE HCL 1 MG/ML IJ SOLN: 0.5000 mg | Freq: Two times a day (BID) | INTRAMUSCULAR | Status: DC | PRN

## 2021-09-14 MED ORDER — GABAPENTIN 400 MG PO CAPS
400.0000 mg | ORAL_CAPSULE | Freq: Three times a day (TID) | ORAL | Status: DC
  Administered 2021-09-14 – 2021-09-19 (×15): 400 mg via ORAL
  Filled 2021-09-14 (×15): qty 1

## 2021-09-14 MED ORDER — CHLORHEXIDINE GLUCONATE CLOTH 2 % EX PADS
6.0000 | MEDICATED_PAD | Freq: Every day | CUTANEOUS | Status: DC
  Administered 2021-09-14 – 2021-09-19 (×6): 6 via TOPICAL

## 2021-09-14 NOTE — Assessment & Plan Note (Signed)
See above

## 2021-09-14 NOTE — Consult Note (Addendum)
[facility] Nurse Consult Note: Reason for Consult: Patient known to our department from last admission, discharged 09/09/21. Left BKA (Dr. Sharol Given) 07/23/21. Unstageable pressure injury to sacrum and right buttock, full thickness, Stage 3 or 4.  Photos taken by Provider on admission both last evening and early this morning and uploaded to the EMR.  Wound type: Pressure Pressure Injury POA: Yes Measurement: 7cm x 7cm x 2cm with undermining from 3-4 cm Wound bed: Pale pink with yellow fibrinous tissue present. Irregular elevations noted in wound bed are often associated with inadequate moisture management or infection.  Skin surface at 9 o'clock is yellow, soft and may indicate nonviable tissue beneath Drainage (amount, consistency, odor) Moderate serous to light yellow on old dressings Periwound: with surface discoloration often associated with undermining and wound extension-particularly at 12 o'clock. Periwound maceration in recovery in the extended area and full thickness issue loss in the immediate area from 7-5 o'clock noted. Dressing procedure/placement/frequency:  I have provided guidance for a mattress replacement with low air loss feature and turning and repositioning with time in the supine position minimized. Conservative care orders for the pressure injury using twice daily filling of the defect with saline moistened gauze and using saline moistened gauze to dressing the rest of the wound (more surface and centrally located), topping with an ABD pad and securing with paper tape or Medipore tape is recommended.  Orders for PRN dressing changes for drainage strike-through or due to soiling are provided.  I have communicated to Dr. Cruzita Lederer via [place] that I recommend consultation with general surgery for the evaluation of this wound to determine the best POC, whether that is  continued use of filler and topical dressings using saline moistened gauze or gauze impregnated with an antimicrobial solution  such as sodium hypochlorite, or  2.   a surgical debridement with or without physical therapy for the provision of hydrotherapy to follow or  3.   hydrotherapy as a standalone intervention with dressings or 4.   another POC   [facility] nursing team will not follow routinely, but will remain available to this patient, the nursing and medical teams.  Please re-consult if needed. Thanks, Maudie Flakes, MSN, RN, [place], Arther Abbott  Pager# [phone]

## 2021-09-14 NOTE — Assessment & Plan Note (Signed)
-   He currently appears euvolemic.  Continue metoprolol

## 2021-09-14 NOTE — Assessment & Plan Note (Addendum)
-  During prior hospitalization patient had retention type symptoms requiring repeated in and out caths. Continue finasteride and tamsulosin

## 2021-09-14 NOTE — Assessment & Plan Note (Addendum)
-  This was diagnosed at his prior hospitalization.  MRI done 1/7 on admission showed continued interval improvement in T6-7 discitis/osteomyelitis with decreased p.o. in the disc space and improved erosion of the T7 superior endplate.  There is also decreased posterior epidural phlegmon and persistent moderate canal stenosis.  -He was on vancomycin from admission 11/14 for 6 weeks through 12/26, currently on cefadroxil 1000 twice daily up until 1/23 -ID consulted, appreciate input.  Continue oral antibiotics for now.  Dr. Drucilla Schmidt saw patient 1/9

## 2021-09-14 NOTE — Assessment & Plan Note (Addendum)
Continue metoprolol. 

## 2021-09-14 NOTE — Assessment & Plan Note (Addendum)
-  during prior hospital stay, wound healing well

## 2021-09-14 NOTE — Assessment & Plan Note (Addendum)
-   Due to chronic disease, no bleeding, continue to monitor

## 2021-09-14 NOTE — Assessment & Plan Note (Addendum)
-  Possibly related to recent missed doses of antibiotics. Recheck blood cultures, currently no growth to date.  WBC normal this morning

## 2021-09-14 NOTE — Plan of Care (Signed)
°  Problem: Education: Goal: Knowledge of General Education information will improve Description: Including pain rating scale, medication(s)/side effects and non-pharmacologic comfort measures 09/14/2021 2258 by Oletha Cruel I, RN Outcome: Progressing 09/14/2021 2257 by Oletha Cruel I, RN Outcome: Progressing   Problem: Health Behavior/Discharge Planning: Goal: Ability to manage health-related needs will improve Outcome: Progressing   Problem: Clinical Measurements: Goal: Respiratory complications will improve Outcome: Progressing   Problem: Nutrition: Goal: Adequate nutrition will be maintained Outcome: Progressing   Problem: Coping: Goal: Level of anxiety will decrease Outcome: Progressing

## 2021-09-14 NOTE — Assessment & Plan Note (Addendum)
-  Continue insulin regimen as below, he is hyperglycemic.  Monitor CBGs and adjust insulin as needed.  CBGs stable  CBG (last 3)  Recent Labs    09/15/21 1611 09/15/21 2148 09/16/21 0823  GLUCAP 115* 206* 186*

## 2021-09-14 NOTE — Assessment & Plan Note (Signed)
-  Baseline creatinine ranging 1.0-1.4, currently at baseline

## 2021-09-14 NOTE — Progress Notes (Signed)
Critical Result  CBG 435 MD aware, no need for stat lab verification, as glucose on AM labs was 412. No changes made to insulin regimen at this time.

## 2021-09-14 NOTE — Assessment & Plan Note (Addendum)
-  He is seen normal sinus rhythm since DCCV 07/2021. Continue amiodarone and Eliquis

## 2021-09-14 NOTE — Progress Notes (Addendum)
Physical Therapy Note  PT eval complete with full note to follow;  Recommend post-acute rehabilitation (Acute Inpt Rehab vs SNF) for rehab and optimal wound care for transition out of hospital;  Limited eval complete as pt is politely declining much mobility until he has a gel cushion for his sitting surface (he anticipates getting one this Wednesday);  Will follow,   Roney Marion, PT  Acute Rehabilitation Services Pager [phone] Office [phone]

## 2021-09-14 NOTE — Assessment & Plan Note (Addendum)
-  Noted to be infected during previous hospitalization on 12/8 and was treated with a total of 7 days of cefepime.  Wound care has been consulted during the last hospitalization and he was receiving Hydro therapy. -Wound care and general surgery consulted, no need for surgical debridement as wound appears fairly good.  Continue local care per wound with twice daily saline dressing and collagenase.  Continue IV Dilaudid with wound manipulation since it is extremely painful -He plans to go home and have family take care of the wound, he needs to designate someone who can come to the hospital to get teaching

## 2021-09-14 NOTE — Assessment & Plan Note (Addendum)
-   Due to high CBGs, stable

## 2021-09-14 NOTE — Progress Notes (Signed)
PROGRESS NOTE  Woodward Klem JME:[id number] DOB: June 03, 1966 DOA: 09/12/2021 PCP: Patient, No Pcp Per (Inactive)   LOS: 0 days   Brief Narrative / Interim history: 56 year old male with history of HTN, chronic systolic CHF, DM2, right BKA due to diabetic foot infection, comes into the hospital with worsening back pain. He recently had a prolonged hospitalization 11/14 2022-09/09/2021 with left foot infection status post BKA 07/23/21, hospital course complicated by Group B strep bacteremia with discitis/osteomyelitis with epidural abscess, new diagnosis of systolic CHF, paroxysmal a flutter, sacral decubitus ulcers.  He went to [facility] and rehab and he reports that had a terrible experience with the nursing staff, his antibiotics were missed as well as pain medications.  Developed subjective fevers/sweats, worsening back pain and was sent back to the hospital.  He does not want to go back there.  On admission he was found to have an elevated CRP at 10.4, slight leukocytosis of 11.7.  Subjective / 24h Interval events: Complains of back pain, sweats overnight  Assessment & Plan: * Recent bacteremia due to group B Streptococcus, Streptococcus agalactiae, thoracic discitis/osteomyelitis with epidural abscess- (present on admission) -This was diagnosed at his prior hospitalization.  MRI done 1/7 on admission showed continued interval improvement in T6-7 discitis/osteomyelitis with decreased p.o. in the disc space and improved erosion of the T7 superior endplate.  There is also decreased posterior epidural phlegmon and persistent moderate canal stenosis.  -He was on vancomycin from admission 11/14 for 6 weeks through 12/26, currently on cefadroxil 1000 twice daily up until 1/23 -I discussed with Dr. Drucilla Schmidt today, continue oral antibiotics.  Sacral pressure injury of skin- (present on admission) -Noted to be infected during previous hospitalization on 12/8 and was treated with a total of 7 days of  cefepime.  Wound care has been consulted during the last hospitalization and he was receiving Hydro therapy. -Consulted wound care, recommending surgical consultation, will call surgery  Pseudohyponatremia- (present on admission) - Due to high CBGs  Chronic systolic CHF (congestive heart failure) ([place]) - He currently appears euvolemic.  Continue metoprolol  Normocytic anemia- (present on admission) - Due to chronic disease, no bleeding, continue to monitor  S/P bilateral BKA (below knee amputation) ([place]) -during prior hospital stay, wound healing well  Essential hypertension- (present on admission) - Continue metoprolol  Paroxysmal atrial flutter ([place])- (present on admission) -He is seen normal sinus rhythm since DCCV 07/2021. Continue amiodarone and Eliquis  Insomnia - Trazodone as needed  Leukocytosis- (present on admission) -Possibly related to recent missed doses of antibiotics. Recheck blood cultures.  Monitor.  Discitis of thoracic region- (present on admission) - See above  Type 2 diabetes mellitus- (present on admission) -Continue insulin regimen as below, he is hyperglycemic.  Monitor CBGs and adjust insulin as needed  CBG (last 3)  Recent Labs    09/13/21 1733 09/13/21 2131 09/14/21 0744  GLUCAP 115* 388* 435*    Urinary hesitancy- (present on admission) -During prior hospitalization patient had retention type symptoms requiring repeated in and out caths. Continue finasteride and tamsulosin  Stage 3a chronic kidney disease ([place])- (present on admission) -Baseline creatinine ranging 1.0-1.4, currently at baseline    Scheduled Meds:  amiodarone  200 mg Oral Daily   apixaban  5 mg Oral BID   cefadroxil  1,000 mg Oral BID   Chlorhexidine Gluconate Cloth  6 each Topical Q0600   finasteride  5 mg Oral Daily   gabapentin  400 mg Oral TID   insulin aspart  0-9  Units Subcutaneous TID WC   insulin aspart  6 Units Subcutaneous TID WC   insulin glargine-yfgn   36 Units Subcutaneous BID   metoprolol succinate  25 mg Oral Daily   oxyCODONE  40 mg Oral Q12H   tamsulosin  0.4 mg Oral Daily   testosterone  5 g Transdermal Daily   Continuous Infusions: PRN Meds:.HYDROmorphone (DILAUDID) injection, HYDROmorphone, LORazepam, LORazepam, traZODone  Diet Orders (From admission, onward)     Start     Ordered   09/13/21 1802  Diet heart healthy/carb modified Room service appropriate? Yes; Fluid consistency: Thin  Diet effective now       Question Answer Comment  Diet-HS Snack? Nothing   Room service appropriate? Yes   Fluid consistency: Thin      09/13/21 1801            DVT prophylaxis:  apixaban (ELIQUIS) tablet 5 mg   Lab Results  Component Value Date   PLT 337 09/14/2021      Code Status: Prior  Family Communication: No family at bedside  Status is: Observation  The patient will require care spanning > 2 midnights and should be moved to inpatient because: Unsafe discharge as he is bedbound and nonambulatory, needs significant SNF, awaiting surgical consultation  Level of care: Telemetry Medical  Consultants:  General surgery ID  Procedures:  none  Microbiology  Blood cultures 1/7-no growth to date  Antimicrobials: Cefadroxil   Objective: Vitals:   09/13/21 2032 09/14/21 0243 09/14/21 0357 09/14/21 0549  BP: 121/66 137/79 139/78   Pulse: 95 77 69   Resp:  18 19   Temp: 98.1 F (36.7 C) 97.7 F (36.5 C) 97.9 F (36.6 C)   TempSrc: Oral Oral Oral   SpO2: 97% 98% 96%   Weight:    109 kg    Intake/Output Summary (Last 24 hours) at 09/14/2021 1128 Last data filed at 09/14/2021 0548 Gross per 24 hour  Intake 1520 ml  Output 1400 ml  Net 120 ml   Wt Readings from Last 3 Encounters:  09/14/21 109 kg  08/01/21 107 kg  05/16/21 108.9 kg    Examination:  Constitutional: NAD Eyes: no scleral icterus ENMT: Mucous membranes are moist.  Neck: normal, supple Respiratory: clear to auscultation bilaterally, no  wheezing, no crackles. Cardiovascular: Regular rate and rhythm, no murmurs / rubs / gallops. No LE edema. Good peripheral pulses Abdomen: non distended, no tenderness. Bowel sounds positive.  Musculoskeletal: no clubbing / cyanosis.  Skin: no rashes, stage IV ulcer as below Neurologic: Nonfocal     Data Reviewed: I have independently reviewed following labs and imaging studies   CBC Recent Labs  Lab 09/13/21 0034 09/14/21 0318  WBC 11.7* 11.2*  HGB 12.7* 11.6*  HCT 38.4* 34.9*  PLT 313 337  MCV 88.7 88.6  MCH 29.3 29.4  MCHC 33.1 33.2  RDW 13.7 14.0  LYMPHSABS 0.5*  --   MONOABS 0.8  --   EOSABS 0.1  --   BASOSABS 0.0  --     Recent Labs  Lab 09/13/21 0034 09/14/21 0318  NA 130* 130*  K 4.0 4.2  CL 97* 95*  CO2 22 23  GLUCOSE 264* 412*  BUN 14 23*  CREATININE 1.09 1.22  CALCIUM 8.8* 8.4*  CRP 10.4*  --     ------------------------------------------------------------------------------------------------------------------ No results for input(s): CHOL, HDL, LDLCALC, TRIG, CHOLHDL, LDLDIRECT in the last 72 hours.  Lab Results  Component Value Date   HGBA1C 13.8 (H) 07/22/2021   ------------------------------------------------------------------------------------------------------------------  No results for input(s): TSH, T4TOTAL, T3FREE, THYROIDAB in the last 72 hours.  Invalid input(s): FREET3  Cardiac Enzymes No results for input(s): CKMB, TROPONINI, MYOGLOBIN in the last 168 hours.  Invalid input(s): CK ------------------------------------------------------------------------------------------------------------------    Component Value Date/Time   BNP 1,368.1 (H) 07/27/2021 0126    CBG: Recent Labs  Lab 09/13/21 1618 09/13/21 1648 09/13/21 1733 09/13/21 2131 09/14/21 0744  GLUCAP 64* 112* 115* 388* 435*    Recent Results (from the past 240 hour(s))  Resp Panel by RT-PCR (Flu A&B, Covid) Nasopharyngeal Swab     Status: None   Collection  Time: 09/06/21  8:56 AM   Specimen: Nasopharyngeal Swab; Nasopharyngeal(NP) swabs in vial transport medium  Result Value Ref Range Status   SARS Coronavirus 2 by RT PCR NEGATIVE NEGATIVE Final    Comment: (NOTE) SARS-CoV-2 target nucleic acids are NOT DETECTED.  The SARS-CoV-2 RNA is generally detectable in upper respiratory specimens during the acute phase of infection. The lowest concentration of SARS-CoV-2 viral copies this assay can detect is 138 copies/mL. A negative result does not preclude SARS-Cov-2 infection and should not be used as the sole basis for treatment or other patient management decisions. A negative result may occur with  improper specimen collection/handling, submission of specimen other than nasopharyngeal swab, presence of viral mutation(s) within the areas targeted by this assay, and inadequate number of viral copies(<138 copies/mL). A negative result must be combined with clinical observations, patient history, and epidemiological information. The expected result is Negative.  Fact Sheet for Patients:  EntrepreneurPulse.com.au  Fact Sheet for Healthcare Providers:  IncredibleEmployment.be  This test is no t yet approved or cleared by the [place] FDA and  has been authorized for detection and/or diagnosis of SARS-CoV-2 by FDA under an Emergency Use Authorization (EUA). This EUA will remain  in effect (meaning this test can be used) for the duration of the COVID-19 declaration under Section 564(b)(1) of the Act, 21 U.S.C.section 360bbb-3(b)(1), unless the authorization is terminated  or revoked sooner.       Influenza A by PCR NEGATIVE NEGATIVE Final   Influenza B by PCR NEGATIVE NEGATIVE Final    Comment: (NOTE) The Xpert Xpress SARS-CoV-2/FLU/RSV plus assay is intended as an aid in the diagnosis of influenza from Nasopharyngeal swab specimens and should not be used as a sole basis for treatment. Nasal washings  and aspirates are unacceptable for Xpert Xpress SARS-CoV-2/FLU/RSV testing.  Fact Sheet for Patients: EntrepreneurPulse.com.au  Fact Sheet for Healthcare Providers: IncredibleEmployment.be  This test is not yet approved or cleared by the [place] FDA and has been authorized for detection and/or diagnosis of SARS-CoV-2 by FDA under an Emergency Use Authorization (EUA). This EUA will remain in effect (meaning this test can be used) for the duration of the COVID-19 declaration under Section 564(b)(1) of the Act, 21 U.S.C. section 360bbb-3(b)(1), unless the authorization is terminated or revoked.  Performed at [facility], [place] [address], [place], [place] [address]   Resp Panel by RT-PCR (Flu A&B, Covid) Nasopharyngeal Swab     Status: None   Collection Time: 09/09/21  3:53 PM   Specimen: Nasopharyngeal Swab; Nasopharyngeal(NP) swabs in vial transport medium  Result Value Ref Range Status   SARS Coronavirus 2 by RT PCR NEGATIVE NEGATIVE Final    Comment: (NOTE) SARS-CoV-2 target nucleic acids are NOT DETECTED.  The SARS-CoV-2 RNA is generally detectable in upper respiratory specimens during the acute phase of infection. The lowest concentration of SARS-CoV-2 viral copies  this assay can detect is 138 copies/mL. A negative result does not preclude SARS-Cov-2 infection and should not be used as the sole basis for treatment or other patient management decisions. A negative result may occur with  improper specimen collection/handling, submission of specimen other than nasopharyngeal swab, presence of viral mutation(s) within the areas targeted by this assay, and inadequate number of viral copies(<138 copies/mL). A negative result must be combined with clinical observations, patient history, and epidemiological information. The expected result is Negative.  Fact Sheet for Patients:  EntrepreneurPulse.com.au  Fact Sheet  for Healthcare Providers:  IncredibleEmployment.be  This test is no t yet approved or cleared by the [place] FDA and  has been authorized for detection and/or diagnosis of SARS-CoV-2 by FDA under an Emergency Use Authorization (EUA). This EUA will remain  in effect (meaning this test can be used) for the duration of the COVID-19 declaration under Section 564(b)(1) of the Act, 21 U.S.C.section 360bbb-3(b)(1), unless the authorization is terminated  or revoked sooner.       Influenza A by PCR NEGATIVE NEGATIVE Final   Influenza B by PCR NEGATIVE NEGATIVE Final    Comment: (NOTE) The Xpert Xpress SARS-CoV-2/FLU/RSV plus assay is intended as an aid in the diagnosis of influenza from Nasopharyngeal swab specimens and should not be used as a sole basis for treatment. Nasal washings and aspirates are unacceptable for Xpert Xpress SARS-CoV-2/FLU/RSV testing.  Fact Sheet for Patients: EntrepreneurPulse.com.au  Fact Sheet for Healthcare Providers: IncredibleEmployment.be  This test is not yet approved or cleared by the [place] FDA and has been authorized for detection and/or diagnosis of SARS-CoV-2 by FDA under an Emergency Use Authorization (EUA). This EUA will remain in effect (meaning this test can be used) for the duration of the COVID-19 declaration under Section 564(b)(1) of the Act, 21 U.S.C. section 360bbb-3(b)(1), unless the authorization is terminated or revoked.  Performed at [facility], [place] [address] [place], [place] [address]   Resp Panel by RT-PCR (Flu A&B, Covid) Nasopharyngeal Swab     Status: None   Collection Time: 09/13/21 12:34 AM   Specimen: Nasopharyngeal Swab; Nasopharyngeal(NP) swabs in vial transport medium  Result Value Ref Range Status   SARS Coronavirus 2 by RT PCR NEGATIVE NEGATIVE Final    Comment: (NOTE) SARS-CoV-2 target nucleic acids are NOT DETECTED.  The SARS-CoV-2 RNA is  generally detectable in upper respiratory specimens during the acute phase of infection. The lowest concentration of SARS-CoV-2 viral copies this assay can detect is 138 copies/mL. A negative result does not preclude SARS-Cov-2 infection and should not be used as the sole basis for treatment or other patient management decisions. A negative result may occur with  improper specimen collection/handling, submission of specimen other than nasopharyngeal swab, presence of viral mutation(s) within the areas targeted by this assay, and inadequate number of viral copies(<138 copies/mL). A negative result must be combined with clinical observations, patient history, and epidemiological information. The expected result is Negative.  Fact Sheet for Patients:  EntrepreneurPulse.com.au  Fact Sheet for Healthcare Providers:  IncredibleEmployment.be  This test is no t yet approved or cleared by the [place] FDA and  has been authorized for detection and/or diagnosis of SARS-CoV-2 by FDA under an Emergency Use Authorization (EUA). This EUA will remain  in effect (meaning this test can be used) for the duration of the COVID-19 declaration under Section 564(b)(1) of the Act, 21 U.S.C.section 360bbb-3(b)(1), unless the authorization is terminated  or revoked sooner.  Influenza A by PCR NEGATIVE NEGATIVE Final   Influenza B by PCR NEGATIVE NEGATIVE Final    Comment: (NOTE) The Xpert Xpress SARS-CoV-2/FLU/RSV plus assay is intended as an aid in the diagnosis of influenza from Nasopharyngeal swab specimens and should not be used as a sole basis for treatment. Nasal washings and aspirates are unacceptable for Xpert Xpress SARS-CoV-2/FLU/RSV testing.  Fact Sheet for Patients: EntrepreneurPulse.com.au  Fact Sheet for Healthcare Providers: IncredibleEmployment.be  This test is not yet approved or cleared by the [place] FDA and has been authorized for detection and/or diagnosis of SARS-CoV-2 by FDA under an Emergency Use Authorization (EUA). This EUA will remain in effect (meaning this test can be used) for the duration of the COVID-19 declaration under Section 564(b)(1) of the Act, 21 U.S.C. section 360bbb-3(b)(1), unless the authorization is terminated or revoked.  Performed at [facility], [place] [address], [place], [place] [address]   Surgical pcr screen     Status: None   Collection Time: 09/13/21 12:28 PM   Specimen: Nasal Mucosa; Nasal Swab  Result Value Ref Range Status   MRSA, PCR NEGATIVE NEGATIVE Final   Staphylococcus aureus NEGATIVE NEGATIVE Final    Comment: (NOTE) The Xpert SA Assay (FDA approved for NASAL specimens in patients 17 years of age and older), is one component of a comprehensive surveillance program. It is not intended to diagnose infection nor to guide or monitor treatment. Performed at [facility], [place] [address] [place], [place] [address]      Radiology Studies: MR THORACIC SPINE W WO CONTRAST  Result Date: 09/13/2021 CLINICAL DATA:  Thoracic osteomyelitis discitis. EXAM: MRI THORACIC AND LUMBAR SPINE WITHOUT AND WITH CONTRAST TECHNIQUE: Multiplanar and multiecho pulse sequences of the thoracic and lumbar spine were obtained without and with intravenous contrast. CONTRAST:  36mL GADAVIST GADOBUTROL 1 MMOL/ML IV SOLN COMPARISON:  MRI thoracic spine dated August 22, 2021. MRI thoracic and lumbar spine dated August 07, 2021. FINDINGS: MRI THORACIC SPINE FINDINGS Alignment:  Physiologic. Vertebrae: Improved T6-T7 discitis osteomyelitis with decreased pus in the disc space. T7 superior endplate erosion has also improved. Intense vertebral body and posterior element marrow edema and enhancement from T5 through T8 is not significantly changed. Cord: Decreased posterior epidural phlegmon compared to prior study without significant residual rim enhancing fluid  collection. Similar ventral epidural phlegmon from T5 through T7. Continued moderate spinal canal stenosis at T6-T7 due to epidural phlegmon. Paraspinal and other soft tissues: Small bilateral pleural effusions have decreased since the prior study. Patchy opacities in both lower lobes. Extensive paravertebral edema and enhancement from T5 through T9 is similar. Disc levels: No additional high-grade spinal canal or neuroforaminal stenosis. MRI LUMBAR SPINE FINDINGS Segmentation:  Standard. Alignment:  Physiologic. Vertebrae: No fracture, evidence of discitis, or bone lesion. Similar acute on chronic degenerative endplate marrow signal changes at L5-S1 with minimal endplate enhancement and trace fluid in the disc space. Conus medullaris: Extends to the L1-L2 level and appears normal. No intradural enhancement. Paraspinal and other soft tissues: No paravertebral inflammatory changes. Similar bilateral paraspinous muscle edema without enhancement. Unchanged mild bilateral hydronephrosis. Unchanged complex left renal cyst. Disc levels: T12-L1: Unchanged tiny left paracentral disc protrusion. No stenosis. L1-L2:  Unchanged mild disc bulging. No stenosis. L2-L3: Unchanged tiny left paracentral disc protrusion and annular fissure. No stenosis. L3-L4:  Negative. L4-L5: Unchanged small broad-based posterior disc protrusion and mild bilateral facet arthropathy. Unchanged mild bilateral neuroforaminal stenosis. No spinal canal stenosis. L5-S1: Unchanged severe disc  height loss, small circumferential disc osteophyte complex, and superimposed small central disc protrusion. Unchanged mild to moderate bilateral facet arthropathy. Unchanged moderate right greater than left neuroforaminal stenosis. No spinal canal stenosis. IMPRESSION: Thoracic spine: 1. Continued interval improvement in T6-T7 discitis osteomyelitis with decreased pus in the disc space and improved erosion of the T7 superior endplate. Decreased posterior epidural  phlegmon. Continued moderate spinal canal stenosis at T6-T7 due to epidural phlegmon. 2. Extensive marrow edema and enhancement involving the T5 through T8 vertebral bodies and posterior elements is not significantly changed and remains consistent with osteomyelitis. Extensive paravertebral edema and enhancement from T5 through T9 is also similar to prior study. 3. Small bilateral pleural effusions have decreased since the prior study. Patchy opacities in both lower lobes may be due to atelectasis or pneumonia. Lumbar spine: 1. No evidence of osteomyelitis-discitis. 2. Unchanged lumbar spondylosis as described above. Unchanged moderate right greater than left neuroforaminal stenosis at L5-S1. 3. Unchanged mild bilateral hydronephrosis. Electronically Signed   By: Titus Dubin M.D.   On: 09/13/2021 17:08   MR Lumbar Spine W Wo Contrast  Result Date: 09/13/2021 CLINICAL DATA:  Thoracic osteomyelitis discitis. EXAM: MRI THORACIC AND LUMBAR SPINE WITHOUT AND WITH CONTRAST TECHNIQUE: Multiplanar and multiecho pulse sequences of the thoracic and lumbar spine were obtained without and with intravenous contrast. CONTRAST:  24mL GADAVIST GADOBUTROL 1 MMOL/ML IV SOLN COMPARISON:  MRI thoracic spine dated August 22, 2021. MRI thoracic and lumbar spine dated August 07, 2021. FINDINGS: MRI THORACIC SPINE FINDINGS Alignment:  Physiologic. Vertebrae: Improved T6-T7 discitis osteomyelitis with decreased pus in the disc space. T7 superior endplate erosion has also improved. Intense vertebral body and posterior element marrow edema and enhancement from T5 through T8 is not significantly changed. Cord: Decreased posterior epidural phlegmon compared to prior study without significant residual rim enhancing fluid collection. Similar ventral epidural phlegmon from T5 through T7. Continued moderate spinal canal stenosis at T6-T7 due to epidural phlegmon. Paraspinal and other soft tissues: Small bilateral pleural effusions have  decreased since the prior study. Patchy opacities in both lower lobes. Extensive paravertebral edema and enhancement from T5 through T9 is similar. Disc levels: No additional high-grade spinal canal or neuroforaminal stenosis. MRI LUMBAR SPINE FINDINGS Segmentation:  Standard. Alignment:  Physiologic. Vertebrae: No fracture, evidence of discitis, or bone lesion. Similar acute on chronic degenerative endplate marrow signal changes at L5-S1 with minimal endplate enhancement and trace fluid in the disc space. Conus medullaris: Extends to the L1-L2 level and appears normal. No intradural enhancement. Paraspinal and other soft tissues: No paravertebral inflammatory changes. Similar bilateral paraspinous muscle edema without enhancement. Unchanged mild bilateral hydronephrosis. Unchanged complex left renal cyst. Disc levels: T12-L1: Unchanged tiny left paracentral disc protrusion. No stenosis. L1-L2:  Unchanged mild disc bulging. No stenosis. L2-L3: Unchanged tiny left paracentral disc protrusion and annular fissure. No stenosis. L3-L4:  Negative. L4-L5: Unchanged small broad-based posterior disc protrusion and mild bilateral facet arthropathy. Unchanged mild bilateral neuroforaminal stenosis. No spinal canal stenosis. L5-S1: Unchanged severe disc height loss, small circumferential disc osteophyte complex, and superimposed small central disc protrusion. Unchanged mild to moderate bilateral facet arthropathy. Unchanged moderate right greater than left neuroforaminal stenosis. No spinal canal stenosis. IMPRESSION: Thoracic spine: 1. Continued interval improvement in T6-T7 discitis osteomyelitis with decreased pus in the disc space and improved erosion of the T7 superior endplate. Decreased posterior epidural phlegmon. Continued moderate spinal canal stenosis at T6-T7 due to epidural phlegmon. 2. Extensive marrow edema and enhancement involving the T5 through T8  vertebral bodies and posterior elements is not significantly  changed and remains consistent with osteomyelitis. Extensive paravertebral edema and enhancement from T5 through T9 is also similar to prior study. 3. Small bilateral pleural effusions have decreased since the prior study. Patchy opacities in both lower lobes may be due to atelectasis or pneumonia. Lumbar spine: 1. No evidence of osteomyelitis-discitis. 2. Unchanged lumbar spondylosis as described above. Unchanged moderate right greater than left neuroforaminal stenosis at L5-S1. 3. Unchanged mild bilateral hydronephrosis. Electronically Signed   By: Titus Dubin M.D.   On: 09/13/2021 17:08     Marzetta Board, MD, PhD [facility]  Between 7 am - 7 pm I am available, please contact me via Amion (for emergencies) or Securechat (non urgent messages)  Between 7 pm - 7 am I am not available, please contact night coverage MD/APP via Amion

## 2021-09-14 NOTE — Hospital Course (Signed)
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 He recently had a prolonged hospitalization 11/14 2022-09/09/2021 with left foot infection status post BKA 07/23/21, hospital course complicated by Group B strep bacteremia with discitis/osteomyelitis with epidural abscess, new diagnosis of systolic CHF, paroxysmal a flutter, sacral decubitus ulcers.  He went to [facility] and rehab and he reports that had a terrible experience with the nursing staff, his antibiotics were missed as well as pain medications.  Developed subjective fevers/sweats, worsening back pain and was sent back to the hospital.  He does not want to go back there.  On admission he was found to have an elevated CRP at 10.4, slight leukocytosis of 11.7.

## 2021-09-14 NOTE — Evaluation (Signed)
Physical Therapy Evaluation Patient Details Name: [name] MRN: [id number] DOB: [birth year] Today's Date: 09/14/2021  History of Present Illness  Sevyn Paredez is a 56 y.o. male  presented with complaints of worsening back pain related to thoracic discitis; with medical history significant of hypertension, CHF, DM type II, right BKA (has prosthesis) and L BKA secondary to diabetic foot infection in mid November; Prolonged hospitalization at that time, 41/32/4401-0/10/7251 with complications due to infection, thoracic discitis, and Sacral pressure wound (which was noted to be infected during previous hospitalization on 12/8)  Clinical Impression   Pt admitted with above diagnosis. Comes to this hospitalization from SNF where he was undergoing rehab; Reports he has been working on bed <>wheelchair transfers and needs some assist for them; reports once he is in the wheelchair (with a good seating surface), he is able to get around in wheelchair independently, including performing theraband exercises in the wheelchair; today, pt presents to PT overall very hesitant to get up to sitting even for a short time without a gel cushion/sitting surface, and eventually declined sitting EOB; noted he will be getting a low air-loss mattress/bed, and he requests a trapeze, which can be helpful;  Pt currently with functional limitations due to the deficits listed below (see PT Problem List). Pt will benefit from skilled PT to increase their independence and safety with mobility to allow discharge to the venue listed below.    Lengthy discussion re: options for core strengthening and therex while still staying off of sacrum (i.e. no situps, hooklying pelvic tilts or pilates-type supine moves); Demonstrated options for quadruped exercises, prone exercises, and knee plank exercises for core strength without pressure on sacrum; pt agreed that these exercises will be good to try, but he is worried about extra pain in back  with attempts to get into prone/quadruped positioning; will make every effort to premedicate for pain; pt is familiar with thereband exercises; provided pt with 3 lengths of blue theraband; he reports the trapeze that he had last admission was useful and is requesting one for his bed        Recommendations for follow up therapy are one component of a multi-disciplinary discharge planning process, led by the attending physician.  Recommendations may be updated based on patient status, additional functional criteria and insurance authorization.  Follow Up Recommendations  (Post-acute Rehab worth considering Acute Inpt Rehab versus SNF)    Assistance Recommended at Discharge Intermittent Supervision/Assistance  Patient can return home with the following  A lot of help with walking and/or transfers;A lot of help with bathing/dressing/bathroom;Assistance with cooking/housework;Assist for transportation    Equipment Recommendations Wheelchair (measurements PT);Wheelchair cushion (measurements PT);Hospital bed;Other (comment);BSC/3in1  Recommendations for Other Services  Rehab consult    Functional Status Assessment Patient has had a recent decline in their functional status and demonstrates the ability to make significant improvements in function in a reasonable and predictable amount of time.     Precautions / Restrictions Precautions Precautions: Fall;Back Precaution Comments: Back precautions for comfort Other Brace: Has R prosthesis, however not in hospital; Pt reports poor fit Restrictions LLE Weight Bearing: Non weight bearing Other Position/Activity Restrictions: NWB through distal L residual limb; BKA surgery is healing well; likely can bear weight through knee for knee plank and quadruped therex      Mobility  Bed Mobility Overal bed mobility: Needs Assistance Bed Mobility: Rolling Rolling: Min guard         General bed mobility comments: Able to roll right for placing more  padding under back to keep him off his sacrum. Declines sitting EOB as it hurts too much or sitting in chair; discussed benefits of prone and possibly quadruped positioning for stretching and therex, and pt hesitant to get prone; at one point, he stated he would need 2 person assist and this PT and RN offered to help, but ultimately pt declined    Transfers                        Ambulation/Gait                  Stairs            Wheelchair Mobility    Modified Rankin (Stroke Patients Only)       Balance                                             Pertinent Vitals/Pain Pain Assessment: Faces Faces Pain Scale: Hurts a little bit Pain Location: Back pain and sacral decub pain; low pain faces score as pt tended to decline moving much this eval due to anticipation of pain Pain Descriptors / Indicators: Grimacing;Sore Pain Intervention(s): Limited activity within patient's tolerance    Home Living Family/patient expects to be discharged to:: [place]: Other (Comment) (SNF, though requests different one) Available Help at Discharge: Friend(s);Available 24 hours/day Type of Home: Apartment Home Access: Elevator       Home Layout: One level Home Equipment: Grab bars - toilet;Grab bars - tub/shower;Shower seat;Wheelchair - manual Additional Comments: Portions of above info gleaned from chart review, PT and OT evaluations completed in mid-Nov 2022    Prior Function Prior Level of Function : Needs assist;Other (comment) (was independent, driving prior to mid-november 2022)             Mobility Comments: was working on wheelchair transfers at rehab at [facility]; needed assist with lateral scoot transfers; Reports independence with wheelchair mobility       Hand Dominance   Dominant Hand: Right    Extremity/Trunk Assessment   Upper Extremity Assessment Upper Extremity Assessment: Defer to OT  evaluation (pt reports overall good UE strength)    Lower Extremity Assessment Lower Extremity Assessment: RLE deficits/detail;LLE deficits/detail RLE Deficits / Details: Hip and knee ROM grossly WFL, though unable to assess Hip extension ROM RLE: Unable to fully assess due to pain LLE Deficits / Details: pt with limited hip flexion due to pain, knee flexion grossly 15 degrees limited by pain, unable to assess hip extension with limited tolerance for hip ABduct/ADD LLE: Unable to fully assess due to pain       Communication   Communication: No difficulties  Cognition Arousal/Alertness: Awake/alert Behavior During Therapy: WFL for tasks assessed/performed Overall Cognitive Status: No family/caregiver present to determine baseline cognitive functioning Area of Impairment: Problem solving                               General Comments: Very focused on seating surface - getting a gel cushion before making any attempts at sitting; Tells this PT that he was working on lateral scoot transfers at rehab, and this PT asked about simply sitting up EOB with good posture to avoid pressure on sacrum, perhaps working on some scoots, and then laying back  down; but pt declines any sitting at all until he gets a gel cushion        General Comments General comments (skin integrity, edema, etc.): Lengthy discussion re: options for core strengthening and therex while still staying off of sacrum (i.e. no situps, hooklying pelvic tilts or pilates-type supine moves); Demonstrated options for quadruped exercises, prone exercises, and knee plank exercises for core strength without pressure on sacrum; pt agreed that these exercises will be good to try, but he is worried about extra pain in back with attempts to get into prone/quadruped positioning; will make every effort to premedicate for pain; pt is familiar with thereband exercises; provided pt with 3 lengths of blue theraband; he reports the trapeze  that he had last admission was useful and is requesting one for his bed    Exercises     Assessment/Plan    PT Assessment Patient needs continued PT services  PT Problem List Decreased strength;Decreased mobility;Decreased range of motion;Decreased activity tolerance;Decreased balance;Pain;Decreased skin integrity;Decreased knowledge of use of DME;Decreased knowledge of precautions       PT Treatment Interventions Functional mobility training;Therapeutic activities;Patient/family education;Balance training;Therapeutic exercise;DME instruction    PT Goals (Current goals can be found in the Care Plan section)  Acute Rehab PT Goals Patient Stated Goal: better core strength; get a gel cushion for sitting surface PT Goal Formulation: With patient Time For Goal Achievement: 09/28/21 Potential to Achieve Goals: Good    Frequency Min 2X/week (May need to amend frequency as DC plan unfolds)     Co-evaluation               AM-PAC PT "6 Clicks" Mobility  Outcome Measure Help needed turning from your back to your side while in a flat bed without using bedrails?: A Little Help needed moving from lying on your back to sitting on the side of a flat bed without using bedrails?: A Lot Help needed moving to and from a bed to a chair (including a wheelchair)?: A Lot Help needed standing up from a chair using your arms (e.g., wheelchair or bedside chair)?: Total Help needed to walk in hospital room?: Total Help needed climbing 3-5 steps with a railing? : Total 6 Click Score: 10    End of Session   Activity Tolerance: Patient limited by pain;Other (comment) (and waiting until he has a gel cushion for seated surface for any practice of sitting) Patient left: in bed;with call bell/phone within reach Nurse Communication: Mobility status PT Visit Diagnosis: Other abnormalities of gait and mobility (R26.89);Muscle weakness (generalized) (M62.81);Pain Pain - part of body:  (Back and sacrum)     Time: 0712-1975 PT Time Calculation (min) (ACUTE ONLY): 28 min   Charges:   PT Evaluation $PT Eval Moderate Complexity: 1 Mod PT Treatments $Therapeutic Activity: 8-22 mins        Roney Marion, PT  Acute Rehabilitation Services Pager [phone] Office [phone]   [place] 09/14/2021, 6:47 PM

## 2021-09-14 NOTE — Plan of Care (Signed)
  Problem: Education: Goal: Knowledge of General Education information will improve Description: Including pain rating scale, medication(s)/side effects and non-pharmacologic comfort measures Outcome: Progressing   Problem: Health Behavior/Discharge Planning: Goal: Ability to manage health-related needs will improve Outcome: Progressing   Problem: Clinical Measurements: Goal: Respiratory complications will improve Outcome: Progressing   

## 2021-09-14 NOTE — Assessment & Plan Note (Signed)
-   Trazodone as needed

## 2021-09-15 DIAGNOSIS — G8220 Paraplegia, unspecified: Secondary | ICD-10-CM | POA: Diagnosis present

## 2021-09-15 DIAGNOSIS — E1169 Type 2 diabetes mellitus with other specified complication: Secondary | ICD-10-CM | POA: Diagnosis present

## 2021-09-15 DIAGNOSIS — G062 Extradural and subdural abscess, unspecified: Secondary | ICD-10-CM | POA: Diagnosis present

## 2021-09-15 DIAGNOSIS — N1831 Chronic kidney disease, stage 3a: Secondary | ICD-10-CM | POA: Diagnosis not present

## 2021-09-15 DIAGNOSIS — I5022 Chronic systolic (congestive) heart failure: Secondary | ICD-10-CM | POA: Diagnosis not present

## 2021-09-15 DIAGNOSIS — G4700 Insomnia, unspecified: Secondary | ICD-10-CM | POA: Diagnosis present

## 2021-09-15 DIAGNOSIS — I4892 Unspecified atrial flutter: Secondary | ICD-10-CM | POA: Diagnosis not present

## 2021-09-15 DIAGNOSIS — G894 Chronic pain syndrome: Secondary | ICD-10-CM | POA: Diagnosis present

## 2021-09-15 DIAGNOSIS — B951 Streptococcus, group B, as the cause of diseases classified elsewhere: Secondary | ICD-10-CM

## 2021-09-15 DIAGNOSIS — L89154 Pressure ulcer of sacral region, stage 4: Secondary | ICD-10-CM

## 2021-09-15 DIAGNOSIS — L89300 Pressure ulcer of unspecified buttock, unstageable: Secondary | ICD-10-CM

## 2021-09-15 DIAGNOSIS — R7881 Bacteremia: Secondary | ICD-10-CM

## 2021-09-15 DIAGNOSIS — M546 Pain in thoracic spine: Secondary | ICD-10-CM | POA: Diagnosis not present

## 2021-09-15 DIAGNOSIS — E871 Hypo-osmolality and hyponatremia: Secondary | ICD-10-CM | POA: Diagnosis present

## 2021-09-15 DIAGNOSIS — M4624 Osteomyelitis of vertebra, thoracic region: Secondary | ICD-10-CM | POA: Diagnosis present

## 2021-09-15 DIAGNOSIS — M4644 Discitis, unspecified, thoracic region: Secondary | ICD-10-CM | POA: Diagnosis present

## 2021-09-15 DIAGNOSIS — M549 Dorsalgia, unspecified: Secondary | ICD-10-CM | POA: Diagnosis present

## 2021-09-15 DIAGNOSIS — E11649 Type 2 diabetes mellitus with hypoglycemia without coma: Secondary | ICD-10-CM | POA: Diagnosis present

## 2021-09-15 DIAGNOSIS — D638 Anemia in other chronic diseases classified elsewhere: Secondary | ICD-10-CM | POA: Diagnosis present

## 2021-09-15 DIAGNOSIS — Z20822 Contact with and (suspected) exposure to covid-19: Secondary | ICD-10-CM | POA: Diagnosis present

## 2021-09-15 DIAGNOSIS — G8929 Other chronic pain: Secondary | ICD-10-CM

## 2021-09-15 DIAGNOSIS — F40240 Claustrophobia: Secondary | ICD-10-CM | POA: Diagnosis present

## 2021-09-15 DIAGNOSIS — I480 Paroxysmal atrial fibrillation: Secondary | ICD-10-CM | POA: Diagnosis present

## 2021-09-15 DIAGNOSIS — L89153 Pressure ulcer of sacral region, stage 3: Secondary | ICD-10-CM | POA: Diagnosis not present

## 2021-09-15 DIAGNOSIS — E1122 Type 2 diabetes mellitus with diabetic chronic kidney disease: Secondary | ICD-10-CM

## 2021-09-15 DIAGNOSIS — F1120 Opioid dependence, uncomplicated: Secondary | ICD-10-CM | POA: Diagnosis present

## 2021-09-15 DIAGNOSIS — E1165 Type 2 diabetes mellitus with hyperglycemia: Secondary | ICD-10-CM | POA: Diagnosis present

## 2021-09-15 DIAGNOSIS — E291 Testicular hypofunction: Secondary | ICD-10-CM | POA: Diagnosis present

## 2021-09-15 DIAGNOSIS — I130 Hypertensive heart and chronic kidney disease with heart failure and stage 1 through stage 4 chronic kidney disease, or unspecified chronic kidney disease: Secondary | ICD-10-CM | POA: Diagnosis present

## 2021-09-15 DIAGNOSIS — I5042 Chronic combined systolic (congestive) and diastolic (congestive) heart failure: Secondary | ICD-10-CM | POA: Diagnosis present

## 2021-09-15 DIAGNOSIS — E785 Hyperlipidemia, unspecified: Secondary | ICD-10-CM | POA: Diagnosis present

## 2021-09-15 DIAGNOSIS — F32A Depression, unspecified: Secondary | ICD-10-CM | POA: Diagnosis present

## 2021-09-15 HISTORY — DX: Pressure ulcer of sacral region, stage 4: L89.154

## 2021-09-15 LAB — BASIC METABOLIC PANEL
Anion gap: 7 (ref 5–15)
BUN: 19 mg/dL (ref 6–20)
CO2: 30 mmol/L (ref 22–32)
Calcium: 8.8 mg/dL — ABNORMAL LOW (ref 8.9–10.3)
Chloride: 97 mmol/L — ABNORMAL LOW (ref 98–111)
Creatinine, Ser: 1.2 mg/dL (ref 0.61–1.24)
GFR, Estimated: >60 mL/min (ref >60)
Glucose, Bld: 224 mg/dL — ABNORMAL HIGH (ref 70–99)
Potassium: 4.1 mmol/L (ref 3.5–5.1)
Sodium: 134 mmol/L — ABNORMAL LOW (ref 135–145)

## 2021-09-15 LAB — GLUCOSE, CAPILLARY
Glucose-Capillary: 157 mg/dL — ABNORMAL HIGH (ref 70–99)
Glucose-Capillary: 179 mg/dL — ABNORMAL HIGH (ref 70–99)
Glucose-Capillary: 115 mg/dL — ABNORMAL HIGH (ref 70–99)
Glucose-Capillary: 206 mg/dL — ABNORMAL HIGH (ref 70–99)

## 2021-09-15 LAB — CBC
HCT: 37.1 % — ABNORMAL LOW (ref 39.0–52.0)
Hemoglobin: 11.7 g/dL — ABNORMAL LOW (ref 13.0–17.0)
MCH: 28.5 pg (ref 26.0–34.0)
MCHC: 31.5 g/dL (ref 30.0–36.0)
MCV: 90.5 fL (ref 80.0–100.0)
Platelets: 350 10*3/uL (ref 150–400)
RBC: 4.1 MIL/uL — ABNORMAL LOW (ref 4.22–5.81)
RDW: 14.1 % (ref 11.5–15.5)
WBC: 10 10*3/uL (ref 4.0–10.5)
nRBC: 0 % (ref 0.0–0.2)

## 2021-09-15 MED ORDER — HYDROMORPHONE HCL 2 MG PO TABS
4.0000 mg | ORAL_TABLET | ORAL | Status: DC | PRN
  Administered 2021-09-15 – 2021-09-18 (×10): 4 mg via ORAL
  Filled 2021-09-15 (×10): qty 2

## 2021-09-15 MED ORDER — COLLAGENASE 250 UNIT/GM EX OINT
TOPICAL_OINTMENT | Freq: Every day | CUTANEOUS | Status: DC
  Filled 2021-09-15: qty 30

## 2021-09-15 MED ORDER — HYDROMORPHONE HCL 1 MG/ML IJ SOLN
1.5000 mg | Freq: Three times a day (TID) | INTRAMUSCULAR | Status: DC | PRN
  Administered 2021-09-15 – 2021-09-19 (×6): 1.5 mg via INTRAVENOUS
  Filled 2021-09-15 (×7): qty 1.5

## 2021-09-15 NOTE — Progress Notes (Signed)
Inpatient Rehab Admissions Coordinator:   Per therapy recommendations, patient was screened for CIR candidacy by Clemens Catholic, MS, CCC-SLP. At this time, Pt. Is not demonstrating ability to transfer out of bed and does not appear able to tolerate the intensity of CIR. Note that during recent admission for the same problem, [employer] did not approve a CIR admission. Recommend other rehab venues to be pursued.  Please contact me with any questions.  Clemens Catholic, [place], [place] Admissions Coordinator  [phone] ([place]) [phone] (office)

## 2021-09-15 NOTE — Plan of Care (Signed)

## 2021-09-15 NOTE — Assessment & Plan Note (Signed)
Continue testosterone 

## 2021-09-15 NOTE — Assessment & Plan Note (Signed)
-   See discussion above between surgery and wound care

## 2021-09-15 NOTE — Progress Notes (Signed)
Physical Therapy Wound Treatment Patient Details  Name: [name] MRN: [id number] Date of Birth: 07-May-1966  Today's Date: 09/15/2021 Time: 1127-1206 Time Calculation (min): 39 min  Subjective  Subjective Assessment Subjective: Pt pleasant and agreeable to hydrotherapy Patient and Family Stated Goals: Return home with home health and not back to SNF Date of Onset:  (PTA) Prior Treatments: Dressing changes, prior hydrotherapy  Pain Score:  Pt premedicated and did not complain of any pain throughout session.  Wound Assessment  Pressure Injury 07/27/21 Sacrum Mid Unstageable - Full thickness tissue loss in which the base of the injury is covered by slough (yellow, tan, gray, green or brown) and/or eschar (tan, brown or black) in the wound bed. small pink pressure ulcer with bre (Active)  Wound Image   09/15/21 1300  Dressing Type ABD;Barrier Film (skin prep);Gauze (Comment);Moist to moist;Santyl 09/15/21 1300  Dressing Changed;Clean;Dry;Intact 09/15/21 1300  Dressing Change Frequency Daily 09/15/21 1300  State of Healing Early/partial granulation 09/15/21 1300  Site / Wound Assessment Pink;Yellow 09/15/21 1300  % Wound base Red or Granulating 40% 09/15/21 1300  % Wound base Yellow/Fibrinous Exudate 60% 09/15/21 1300  % Wound base Black/Eschar 0% 09/15/21 1300  % Wound base Other/Granulation Tissue (Comment) 0% 09/15/21 1300  Peri-wound Assessment Pink;Intact 09/15/21 1300  Wound Length (cm) 6.5 cm 09/15/21 1137  Wound Width (cm) 6.5 cm 09/15/21 1137  Wound Depth (cm) 3 cm 09/15/21 1137  Wound Surface Area (cm^2) 42.25 cm^2 09/15/21 1137  Wound Volume (cm^3) 126.75 cm^3 09/15/21 1137  Tunneling (cm) 0 09/15/21 1300  Undermining (cm) 3.3 cm 12:00 09/15/21 1137  Margins Unattached edges (unapproximated) 09/15/21 1300  Drainage Amount Minimal 09/15/21 1300  Drainage Description Serosanguineous 09/15/21 1300  Treatment Debridement (Selective);Hydrotherapy (Pulse lavage);Packing  (Saline gauze) 09/15/21 1300      Hydrotherapy Pulsed lavage therapy - wound location: Sacrum Pulsed Lavage with Suction (psi): 8 psi Pulsed Lavage with Suction - Normal Saline Used: 1000 mL Pulsed Lavage Tip: Tip with splash shield Selective Debridement Selective Debridement - Location: Sacrum Selective Debridement - Tools Used: Forceps, Scalpel Selective Debridement - Tissue Removed: yellow adherent necrotic tissue and slough    Wound Assessment and Plan  Wound Therapy - Assess/Plan/Recommendations Wound Therapy - Clinical Statement: Pt known to hydrotherapy and presents with sacral wound which has become increasingly necrotic since last admission. Photo appears to have black necrotic tissue present however these are shadows. No black tissue or eschar noted. This patient will benefit from continued hydrotherapy for selective removal of unviable tissue, to decrease bioburden, and promote wound bed healing. Wound Therapy - Functional Problem List: Global weakness Factors Delaying/Impairing Wound Healing: Diabetes Mellitus, Incontinence, Infection - systemic/local, Immobility, Multiple medical problems Hydrotherapy Plan: Debridement, Dressing change, Patient/family education, Pulsatile lavage with suction Wound Therapy - Frequency: 6X / week Wound Therapy - Current Recommendations: PT Wound Therapy - Follow Up Recommendations: dressing changes by family/patient  Wound Therapy Goals- Improve the function of patient's integumentary system by progressing the wound(s) through the phases of wound healing (inflammation - proliferation - remodeling) by: Wound Therapy Goals - Improve the function of patient's integumentary system by progressing the wound(s) through the phases of wound healing by: Decrease Necrotic Tissue to: 20% Decrease Necrotic Tissue - Progress: Goal set today Increase Granulation Tissue to: 80% Increase Granulation Tissue - Progress: Goal set today Goals/treatment  plan/discharge plan were made with and agreed upon by patient/family: Yes Time For Goal Achievement: 7 days Wound Therapy - Potential for Goals: Good  Goals will be  updated until maximal potential achieved or discharge criteria met.  Discharge criteria: when goals achieved, discharge from hospital, MD decision/surgical intervention, no progress towards goals, refusal/missing three consecutive treatments without notification or medical reason.  GP     Charges PT Wound Care Charges $Wound Debridement up to 20 cm: < or equal to 20 cm $PT Hydrotherapy Dressing: 1 dressing $PT PLS Gun and Tip: 1 Supply $PT Hydrotherapy Visit: 1 Visit       [name] 09/15/2021, 1:53 PM  [name], PT, DPT Acute Rehabilitation Services Pager: [phone] Office: [phone]

## 2021-09-15 NOTE — Progress Notes (Signed)
PROGRESS NOTE  [name] NID:[id number] DOB: 06-20-1966 DOA: 09/12/2021 PCP: Patient, No Pcp Per (Inactive)   LOS: 0 days   Brief Narrative / Interim history: 56 year old male with history of HTN, chronic systolic CHF, DM2, right BKA due to diabetic foot infection, comes into the hospital with worsening back pain. He recently had a prolonged hospitalization 11/14 2022-09/09/2021 with left foot infection status post BKA 07/23/21, hospital course complicated by Group B strep bacteremia with discitis/osteomyelitis with epidural abscess, new diagnosis of systolic CHF, paroxysmal a flutter, sacral decubitus ulcers.  He went to [facility] and rehab and he reports that had a terrible experience with the nursing staff, his antibiotics were missed as well as pain medications.  Developed subjective fevers/sweats, worsening back pain and was sent back to the hospital.  He does not want to go back there.  On admission he was found to have an elevated CRP at 10.4, slight leukocytosis of 11.7.  Subjective / 24h Interval events: -Complains of severe back pain at the decubitus ulcer level.  Assessment & Plan: * Recent bacteremia due to group B Streptococcus, Streptococcus agalactiae, thoracic discitis/osteomyelitis with epidural abscess- (present on admission) -This was diagnosed at his prior hospitalization.  MRI done 1/7 on admission showed continued interval improvement in T6-7 discitis/osteomyelitis with decreased p.o. in the disc space and improved erosion of the T7 superior endplate.  There is also decreased posterior epidural phlegmon and persistent moderate canal stenosis.  -He was on vancomycin from admission 11/14 for 6 weeks through 12/26, currently on cefadroxil 1000 twice daily up until 1/23 -ID consulted, appreciate input.  Continue oral antibiotics.  Discussed with Dr. Drucilla Schmidt today  Sacral pressure injury of skin- (present on admission) -Noted to be infected during previous hospitalization  on 12/8 and was treated with a total of 7 days of cefepime.  Wound care has been consulted during the last hospitalization and he was receiving Hydro therapy. -Wound care and general surgery consulted, no need for surgical debridement as wound appears fairly good.  Continue local care per wound with twice daily saline dressing and collagenase.  Continue IV Dilaudid with wound manipulation since it is extremely painful  Pseudohyponatremia- (present on admission) - Due to high CBGs, stable  Chronic systolic CHF (congestive heart failure) ([place]) - He currently appears euvolemic.  Continue metoprolol  Normocytic anemia- (present on admission) - Due to chronic disease, no bleeding, continue to monitor  S/P bilateral BKA (below knee amputation) ([place]) -during prior hospital stay, wound healing well  Essential hypertension- (present on admission) - Continue metoprolol  Paroxysmal atrial flutter ([place])- (present on admission) -He is seen normal sinus rhythm since DCCV 07/2021. Continue amiodarone and Eliquis  Stage IV pressure ulcer of sacral region [facility])- (present on admission) - See discussion above between surgery and wound care  Chronic pain -Continue oxycodone, p.o. Dilaudid  Hypogonadism in male -Continue testosterone  Insomnia - Trazodone as needed  Leukocytosis- (present on admission) -Possibly related to recent missed doses of antibiotics. Recheck blood cultures, currently no growth to date.  WBC normal this morning  Discitis of thoracic region- (present on admission) - See above  Type 2 diabetes mellitus- (present on admission) -Continue insulin regimen as below, he is hyperglycemic.  Monitor CBGs and adjust insulin as needed.  CBGs stable  CBG (last 3)  Recent Labs    09/14/21 2123 09/15/21 0723 09/15/21 1105  GLUCAP 242* 157* 179*    Urinary hesitancy- (present on admission) -During prior hospitalization patient had retention type symptoms  requiring repeated in  and out caths. Continue finasteride and tamsulosin  Stage 3a chronic kidney disease ([place])- (present on admission) -Baseline creatinine ranging 1.0-1.4, currently at baseline    Scheduled Meds:  amiodarone  200 mg Oral Daily   apixaban  5 mg Oral BID   cefadroxil  1,000 mg Oral BID   Chlorhexidine Gluconate Cloth  6 each Topical Q0600   collagenase   Topical Daily   finasteride  5 mg Oral Daily   gabapentin  400 mg Oral TID   insulin aspart  0-9 Units Subcutaneous TID WC   insulin aspart  6 Units Subcutaneous TID WC   insulin glargine-yfgn  36 Units Subcutaneous BID   metoprolol succinate  25 mg Oral Daily   oxyCODONE  40 mg Oral Q12H   tamsulosin  0.4 mg Oral Daily   testosterone  5 g Transdermal Daily   Continuous Infusions: PRN Meds:.HYDROmorphone (DILAUDID) injection, HYDROmorphone, LORazepam, LORazepam, traZODone  Diet Orders (From admission, onward)     Start     Ordered   09/13/21 1802  Diet heart healthy/carb modified Room service appropriate? Yes; Fluid consistency: Thin  Diet effective now       Question Answer Comment  Diet-HS Snack? Nothing   Room service appropriate? Yes   Fluid consistency: Thin      09/13/21 1801            DVT prophylaxis:  apixaban (ELIQUIS) tablet 5 mg   Lab Results  Component Value Date   PLT 350 09/15/2021      Code Status: Prior  Family Communication: No family at bedside  Status is: Inpatient   Level of care: Med-Surg  Consultants:  General surgery ID  Procedures:  none  Microbiology  Blood cultures 1/7-no growth to date  Antimicrobials: Cefadroxil   Objective: Vitals:   09/14/21 0549 09/14/21 1144 09/14/21 2135 09/15/21 0722  BP:  137/76 133/81 133/79  Pulse:  73 67 66  Resp:  20  17  Temp:  98.1 F (36.7 C) 98.4 F (36.9 C) 97.9 F (36.6 C)  TempSrc:  Oral Oral Oral  SpO2:  100% 95% 98%  Weight: 109 kg       Intake/Output Summary (Last 24 hours) at 09/15/2021 1108 Last data filed at  09/15/2021 8527 Gross per 24 hour  Intake --  Output 2900 ml  Net -2900 ml    Wt Readings from Last 3 Encounters:  09/14/21 109 kg  08/01/21 107 kg  05/16/21 108.9 kg    Examination:  Constitutional: NAD Eyes: Anicteric ENMT: Moist membranes Neck: normal, supple Respiratory: CTA, no wheezing Cardiovascular: Regular rate and rhythm, no murmurs, no edema Abdomen: Soft, NT, ND, bowel sounds positive Musculoskeletal: no clubbing / cyanosis.  Skin: no rashes, stage IV ulcer as as imaged Neurologic: No focal deficits  Data Reviewed: I have independently reviewed following labs and imaging studies   CBC Recent Labs  Lab 09/13/21 0034 09/14/21 0318 09/15/21 0317  WBC 11.7* 11.2* 10.0  HGB 12.7* 11.6* 11.7*  HCT 38.4* 34.9* 37.1*  PLT 313 337 350  MCV 88.7 88.6 90.5  MCH 29.3 29.4 28.5  MCHC 33.1 33.2 31.5  RDW 13.7 14.0 14.1  LYMPHSABS 0.5*  --   --   MONOABS 0.8  --   --   EOSABS 0.1  --   --   BASOSABS 0.0  --   --      Recent Labs  Lab 09/13/21 0034 09/14/21 0318 09/15/21 0317  NA  130* 130* 134*  K 4.0 4.2 4.1  CL 97* 95* 97*  CO2 22 23 30   GLUCOSE 264* 412* 224*  BUN 14 23* 19  CREATININE 1.09 1.22 1.20  CALCIUM 8.8* 8.4* 8.8*  CRP 10.4*  --   --      ------------------------------------------------------------------------------------------------------------------ No results for input(s): CHOL, HDL, LDLCALC, TRIG, CHOLHDL, LDLDIRECT in the last 72 hours.  Lab Results  Component Value Date   HGBA1C 13.8 (H) 07/22/2021   ------------------------------------------------------------------------------------------------------------------ No results for input(s): TSH, T4TOTAL, T3FREE, THYROIDAB in the last 72 hours.  Invalid input(s): FREET3  Cardiac Enzymes No results for input(s): CKMB, TROPONINI, MYOGLOBIN in the last 168 hours.  Invalid input(s):  CK ------------------------------------------------------------------------------------------------------------------    Component Value Date/Time   BNP 1,368.1 (H) 07/27/2021 0126    CBG: Recent Labs  Lab 09/14/21 1142 09/14/21 1646 09/14/21 2123 09/15/21 0723 09/15/21 1105  GLUCAP 170* 212* 242* 157* 179*     Recent Results (from the past 240 hour(s))  Resp Panel by RT-PCR (Flu A&B, Covid) Nasopharyngeal Swab     Status: None   Collection Time: 09/06/21  8:56 AM   Specimen: Nasopharyngeal Swab; Nasopharyngeal(NP) swabs in vial transport medium  Result Value Ref Range Status   SARS Coronavirus 2 by RT PCR NEGATIVE NEGATIVE Final    Comment: (NOTE) SARS-CoV-2 target nucleic acids are NOT DETECTED.  The SARS-CoV-2 RNA is generally detectable in upper respiratory specimens during the acute phase of infection. The lowest concentration of SARS-CoV-2 viral copies this assay can detect is 138 copies/mL. A negative result does not preclude SARS-Cov-2 infection and should not be used as the sole basis for treatment or other patient management decisions. A negative result may occur with  improper specimen collection/handling, submission of specimen other than nasopharyngeal swab, presence of viral mutation(s) within the areas targeted by this assay, and inadequate number of viral copies(<138 copies/mL). A negative result must be combined with clinical observations, patient history, and epidemiological information. The expected result is Negative.  Fact Sheet for Patients:  EntrepreneurPulse.com.au  Fact Sheet for Healthcare Providers:  IncredibleEmployment.be  This test is no t yet approved or cleared by the [place] FDA and  has been authorized for detection and/or diagnosis of SARS-CoV-2 by FDA under an Emergency Use Authorization (EUA). This EUA will remain  in effect (meaning this test can be used) for the duration of the COVID-19  declaration under Section 564(b)(1) of the Act, 21 U.S.C.section 360bbb-3(b)(1), unless the authorization is terminated  or revoked sooner.       Influenza A by PCR NEGATIVE NEGATIVE Final   Influenza B by PCR NEGATIVE NEGATIVE Final    Comment: (NOTE) The Xpert Xpress SARS-CoV-2/FLU/RSV plus assay is intended as an aid in the diagnosis of influenza from Nasopharyngeal swab specimens and should not be used as a sole basis for treatment. Nasal washings and aspirates are unacceptable for Xpert Xpress SARS-CoV-2/FLU/RSV testing.  Fact Sheet for Patients: EntrepreneurPulse.com.au  Fact Sheet for Healthcare Providers: IncredibleEmployment.be  This test is not yet approved or cleared by the [place] FDA and has been authorized for detection and/or diagnosis of SARS-CoV-2 by FDA under an Emergency Use Authorization (EUA). This EUA will remain in effect (meaning this test can be used) for the duration of the COVID-19 declaration under Section 564(b)(1) of the Act, 21 U.S.C. section 360bbb-3(b)(1), unless the authorization is terminated or revoked.  Performed at [facility], [place] [address] [place], [place] [address]   Resp Panel by  RT-PCR (Flu A&B, Covid) Nasopharyngeal Swab     Status: None   Collection Time: 09/09/21  3:53 PM   Specimen: Nasopharyngeal Swab; Nasopharyngeal(NP) swabs in vial transport medium  Result Value Ref Range Status   SARS Coronavirus 2 by RT PCR NEGATIVE NEGATIVE Final    Comment: (NOTE) SARS-CoV-2 target nucleic acids are NOT DETECTED.  The SARS-CoV-2 RNA is generally detectable in upper respiratory specimens during the acute phase of infection. The lowest concentration of SARS-CoV-2 viral copies this assay can detect is 138 copies/mL. A negative result does not preclude SARS-Cov-2 infection and should not be used as the sole basis for treatment or other patient management decisions. A negative result may  occur with  improper specimen collection/handling, submission of specimen other than nasopharyngeal swab, presence of viral mutation(s) within the areas targeted by this assay, and inadequate number of viral copies(<138 copies/mL). A negative result must be combined with clinical observations, patient history, and epidemiological information. The expected result is Negative.  Fact Sheet for Patients:  EntrepreneurPulse.com.au  Fact Sheet for Healthcare Providers:  IncredibleEmployment.be  This test is no t yet approved or cleared by the [place] FDA and  has been authorized for detection and/or diagnosis of SARS-CoV-2 by FDA under an Emergency Use Authorization (EUA). This EUA will remain  in effect (meaning this test can be used) for the duration of the COVID-19 declaration under Section 564(b)(1) of the Act, 21 U.S.C.section 360bbb-3(b)(1), unless the authorization is terminated  or revoked sooner.       Influenza A by PCR NEGATIVE NEGATIVE Final   Influenza B by PCR NEGATIVE NEGATIVE Final    Comment: (NOTE) The Xpert Xpress SARS-CoV-2/FLU/RSV plus assay is intended as an aid in the diagnosis of influenza from Nasopharyngeal swab specimens and should not be used as a sole basis for treatment. Nasal washings and aspirates are unacceptable for Xpert Xpress SARS-CoV-2/FLU/RSV testing.  Fact Sheet for Patients: EntrepreneurPulse.com.au  Fact Sheet for Healthcare Providers: IncredibleEmployment.be  This test is not yet approved or cleared by the [place] FDA and has been authorized for detection and/or diagnosis of SARS-CoV-2 by FDA under an Emergency Use Authorization (EUA). This EUA will remain in effect (meaning this test can be used) for the duration of the COVID-19 declaration under Section 564(b)(1) of the Act, 21 U.S.C. section 360bbb-3(b)(1), unless the authorization is terminated  or revoked.  Performed at [facility], [place] [address] [place], [place] [address]   Resp Panel by RT-PCR (Flu A&B, Covid) Nasopharyngeal Swab     Status: None   Collection Time: 09/13/21 12:34 AM   Specimen: Nasopharyngeal Swab; Nasopharyngeal(NP) swabs in vial transport medium  Result Value Ref Range Status   SARS Coronavirus 2 by RT PCR NEGATIVE NEGATIVE Final    Comment: (NOTE) SARS-CoV-2 target nucleic acids are NOT DETECTED.  The SARS-CoV-2 RNA is generally detectable in upper respiratory specimens during the acute phase of infection. The lowest concentration of SARS-CoV-2 viral copies this assay can detect is 138 copies/mL. A negative result does not preclude SARS-Cov-2 infection and should not be used as the sole basis for treatment or other patient management decisions. A negative result may occur with  improper specimen collection/handling, submission of specimen other than nasopharyngeal swab, presence of viral mutation(s) within the areas targeted by this assay, and inadequate number of viral copies(<138 copies/mL). A negative result must be combined with clinical observations, patient history, and epidemiological information. The expected result is Negative.  Fact Sheet for Patients:  EntrepreneurPulse.com.au  Fact Sheet for Healthcare Providers:  IncredibleEmployment.be  This test is no t yet approved or cleared by the [place] FDA and  has been authorized for detection and/or diagnosis of SARS-CoV-2 by FDA under an Emergency Use Authorization (EUA). This EUA will remain  in effect (meaning this test can be used) for the duration of the COVID-19 declaration under Section 564(b)(1) of the Act, 21 U.S.C.section 360bbb-3(b)(1), unless the authorization is terminated  or revoked sooner.       Influenza A by PCR NEGATIVE NEGATIVE Final   Influenza B by PCR NEGATIVE NEGATIVE Final    Comment: (NOTE) The Xpert Xpress  SARS-CoV-2/FLU/RSV plus assay is intended as an aid in the diagnosis of influenza from Nasopharyngeal swab specimens and should not be used as a sole basis for treatment. Nasal washings and aspirates are unacceptable for Xpert Xpress SARS-CoV-2/FLU/RSV testing.  Fact Sheet for Patients: EntrepreneurPulse.com.au  Fact Sheet for Healthcare Providers: IncredibleEmployment.be  This test is not yet approved or cleared by the [place] FDA and has been authorized for detection and/or diagnosis of SARS-CoV-2 by FDA under an Emergency Use Authorization (EUA). This EUA will remain in effect (meaning this test can be used) for the duration of the COVID-19 declaration under Section 564(b)(1) of the Act, 21 U.S.C. section 360bbb-3(b)(1), unless the authorization is terminated or revoked.  Performed at [facility], [place] [address] [place], [place] [address]   Surgical pcr screen     Status: None   Collection Time: 09/13/21 12:28 PM   Specimen: Nasal Mucosa; Nasal Swab  Result Value Ref Range Status   MRSA, PCR NEGATIVE NEGATIVE Final   Staphylococcus aureus NEGATIVE NEGATIVE Final    Comment: (NOTE) The Xpert SA Assay (FDA approved for NASAL specimens in patients 103 years of age and older), is one component of a comprehensive surveillance program. It is not intended to diagnose infection nor to guide or monitor treatment. Performed at [facility], [place] [address] [place], [place] [address]   Culture, blood (routine x 2)     Status: None (Preliminary result)   Collection Time: 09/13/21 11:45 PM   Specimen: BLOOD RIGHT HAND  Result Value Ref Range Status   Specimen Description BLOOD RIGHT HAND  Final   Special Requests   Final    BOTTLES DRAWN AEROBIC AND ANAEROBIC Blood Culture adequate volume   Culture   Final    NO GROWTH 1 DAY Performed at [facility], [place] [address], [place], [place] [address]    Report Status PENDING   Incomplete  Culture, blood (routine x 2)     Status: None (Preliminary result)   Collection Time: 09/13/21 11:45 PM   Specimen: BLOOD LEFT HAND  Result Value Ref Range Status   Specimen Description BLOOD LEFT HAND  Final   Special Requests   Final    BOTTLES DRAWN AEROBIC AND ANAEROBIC Blood Culture adequate volume   Culture   Final    NO GROWTH 1 DAY Performed at [facility], [place] [address] [place], [place] [address]    Report Status PENDING  Incomplete      Radiology Studies: No results found.   Marzetta Board, MD, PhD [facility]  Between 7 am - 7 pm I am available, please contact me via Amion (for emergencies) or Securechat (non urgent messages)  Between 7 pm - 7 am I am not available, please contact night coverage MD/APP via Amion

## 2021-09-15 NOTE — Consult Note (Signed)
Date of Admission:  09/12/2021          Reason for Consult:  thoracic diskitis and epidural phlegmon. Unstage-able decubitus ulcer Referring Provider: Marzetta Board, MD   Assessment:  Thoracic discitis vertebral osteomyelitis with epidural phlegmon which is improving History of group B streptococcus bacteremia which was complicated by diabetic foot infection osteomyelitis status post left BKA in November (this was source of his diskitis and osteomyelitis Sacral wound sp treatment with cefepime in past Poorly controlled DM CKD  Plan:  Continue cefadroxil for his spinal infection General surgery have consulted and examined his wound and do not find evidence for infection.  They recommend he may need a wound vacuum in the future  Patient already has follow-up with Dr. Juleen China scheduled.  We will sign off for now  Please call with further questions.  Principal Problem:   Recent bacteremia due to group B Streptococcus, Streptococcus agalactiae, thoracic discitis/osteomyelitis with epidural abscess Active Problems:   Essential hypertension   Stage 3a chronic kidney disease (HCC)   Urinary hesitancy   Type 2 diabetes mellitus   S/P bilateral BKA (below knee amputation) (HCC)   Paroxysmal atrial flutter (HCC)   Sacral pressure injury of skin   Discitis of thoracic region   Back pain   Leukocytosis   Pseudohyponatremia   Normocytic anemia   Insomnia   Chronic systolic CHF (congestive heart failure) (HCC)   Hypogonadism in male   Chronic pain   Stage IV pressure ulcer of sacral region ([place])   Scheduled Meds:  amiodarone  200 mg Oral Daily   apixaban  5 mg Oral BID   cefadroxil  1,000 mg Oral BID   Chlorhexidine Gluconate Cloth  6 each Topical Q0600   collagenase   Topical Daily   finasteride  5 mg Oral Daily   gabapentin  400 mg Oral TID   insulin aspart  0-9 Units Subcutaneous TID WC   insulin aspart  6 Units Subcutaneous TID WC   insulin glargine-yfgn  36 Units  Subcutaneous BID   metoprolol succinate  25 mg Oral Daily   oxyCODONE  40 mg Oral Q12H   tamsulosin  0.4 mg Oral Daily   testosterone  5 g Transdermal Daily   Continuous Infusions: PRN Meds:.HYDROmorphone (DILAUDID) injection, HYDROmorphone, LORazepam, LORazepam, traZODone  HPI: [name] is a 56 y.o. male with chronic kidney disease and poorly controlled diabetes mellitus who was admitted in November with group A streptococcal bacteremia secondary to diabetic foot infection with osteomyelitis status post below the knee amputation who also developed thoracic spine discitis and vertebral osteomyelitis with an epidural phlegmon.  We had actually wanted to treat him with an IV beta-lactam but he had insisted on being treated with vancomycin.  Please see prior notes.  He completed therapy and was transitioned over to oral cefadroxil which she is taking currently.  During one of his recent hospitalizations he is's unstageable sacral decubitus ulcer which had developed was treated with cefepime.  He ultimately was discharged to [facility] and rehab.  He says that they did not have records of what medications he should have had and that he went without his antibiotics and without correct pain medications.  He says that he ultimately called the police on them.  He may also develop some subjective fevers and chills and malaise. Patient came to the ER and was admitted to hospitalist service.  Ultimately underwent repeat MRI of the thoracic and lumbar spine under general anesthesia.  MRI shows continued radiographic improvement in his T6-T7 discitis and osteomyelitis with decreased pus in the disc space and less erosion at T7 superior endpoint and decrease in size of posterior epidural phlegmon with continued spinal Stenosis at T6-T7 due to this epidural phlegmon and extensive marrow edema enhancement T5-T8 vertebral bodies which has not changed along with paravertebral edema and enhancement  of T5-T9.  He continues to have an unstageable decubitus ulcer and has had drainage from it but no purulent drainage.  He has been seen by wound care and today by general surgery who did not find evidence for infection.  In terms of his thoracic spine infection he should resume his cefadroxil and can follow-up with Dr. Juleen China.  I would favor protracted oral therapy.  In terms of his decubitus ulcer it does not appear to need debridement or antibiotics.  He definitely needs this area offloaded.  He says he is not being turned frequently enough at the skilled nursing facility and is being allowed to lay in urine and feces.  ? Is he strong enough to turn himself?  I spent 82 minutes with the patient including than 50% of the time in face to face counseling of the patient guarding his vertebral discitis osteomyelitis epidural phlegmon in his history of BKA his history of unstageable decubitus ulcer, personally reviewing RI of the thoracic and lumbar spine inflammatory markers CBC BMP along with review of medical records in preparation for the visit and during the visit and in coordination of his  care.    Review of Systems: Review of Systems  Constitutional:  Positive for fever and malaise/fatigue. Negative for chills and weight loss.  HENT:  Negative for congestion and sore throat.   Eyes:  Negative for blurred vision and photophobia.  Respiratory:  Negative for cough, shortness of breath and wheezing.   Cardiovascular:  Negative for chest pain, palpitations and leg swelling.  Gastrointestinal:  Negative for abdominal pain, blood in stool, constipation, diarrhea, heartburn, melena, nausea and vomiting.  Genitourinary:  Negative for dysuria, flank pain and hematuria.  Musculoskeletal:  Positive for myalgias. Negative for back pain, falls and joint pain.  Skin:  Negative for itching and rash.  Neurological:  Negative for dizziness, focal weakness, loss of consciousness, weakness and  headaches.  Endo/Heme/Allergies:  Does not bruise/bleed easily.  Psychiatric/Behavioral:  Positive for depression. Negative for suicidal ideas. The patient does not have insomnia.    Past Medical History:  Diagnosis Date   Chronic diastolic CHF (congestive heart failure) (HCC)    Complication of anesthesia    Depression    Diabetes mellitus without complication (HCC)    Hypertension     Social History   Tobacco Use   Smoking status: Never   Smokeless tobacco: Never  Vaping Use   Vaping Use: Never used  Substance Use Topics   Alcohol use: Never   Drug use: Never    Family History  Problem Relation Age of Onset   Anxiety disorder Mother    Cancer Father    Coronary artery disease Father    Allergies  Allergen Reactions   Bactrim [Sulfamethoxazole-Trimethoprim]    Ceprotin [Protein C Concentrate (Human)]    Ciprofloxacin Other (See Comments)    Kidney function   Levaquin [Levofloxacin]     OBJECTIVE: Blood pressure 133/79, pulse 66, temperature 97.9 F (36.6 C), temperature source Oral, resp. rate 17, weight 109 kg, SpO2 98 %.  Physical Exam Constitutional:      Appearance: He is well-developed.  HENT:     Head: Normocephalic and atraumatic.  Eyes:     Conjunctiva/sclera: Conjunctivae normal.  Cardiovascular:     Rate and Rhythm: Normal rate and regular rhythm.  Pulmonary:     Effort: Pulmonary effort is normal. No respiratory distress.     Breath sounds: No wheezing.  Abdominal:     General: There is no distension.     Palpations: Abdomen is soft.  Musculoskeletal:        General: No tenderness. Normal range of motion.     Cervical back: Normal range of motion and neck supple.  Skin:    General: Skin is warm and dry.  Neurological:     General: No focal deficit present.     Mental Status: He is alert and oriented to person, place, and time.  Psychiatric:        Mood and Affect: Mood normal.        Behavior: Behavior normal.        Thought Content:  Thought content normal.        Judgment: Judgment normal.   Sacral wound not examined but pictures from media in epic reviewed.   Lab Results Lab Results  Component Value Date   WBC 10.0 09/15/2021   HGB 11.7 (L) 09/15/2021   HCT 37.1 (L) 09/15/2021   MCV 90.5 09/15/2021   PLT 350 09/15/2021    Lab Results  Component Value Date   CREATININE 1.20 09/15/2021   BUN 19 09/15/2021   NA 134 (L) 09/15/2021   K 4.1 09/15/2021   CL 97 (L) 09/15/2021   CO2 30 09/15/2021    Lab Results  Component Value Date   ALT 33 08/20/2021   AST 20 08/20/2021   ALKPHOS 108 08/20/2021   BILITOT 0.4 08/20/2021     Microbiology: Recent Results (from the past 240 hour(s))  Resp Panel by RT-PCR (Flu A&B, Covid) Nasopharyngeal Swab     Status: None   Collection Time: 09/06/21  8:56 AM   Specimen: Nasopharyngeal Swab; Nasopharyngeal(NP) swabs in vial transport medium  Result Value Ref Range Status   SARS Coronavirus 2 by RT PCR NEGATIVE NEGATIVE Final    Comment: (NOTE) SARS-CoV-2 target nucleic acids are NOT DETECTED.  The SARS-CoV-2 RNA is generally detectable in upper respiratory specimens during the acute phase of infection. The lowest concentration of SARS-CoV-2 viral copies this assay can detect is 138 copies/mL. A negative result does not preclude SARS-Cov-2 infection and should not be used as the sole basis for treatment or other patient management decisions. A negative result may occur with  improper specimen collection/handling, submission of specimen other than nasopharyngeal swab, presence of viral mutation(s) within the areas targeted by this assay, and inadequate number of viral copies(<138 copies/mL). A negative result must be combined with clinical observations, patient history, and epidemiological information. The expected result is Negative.  Fact Sheet for Patients:  EntrepreneurPulse.com.au  Fact Sheet for Healthcare Providers:   IncredibleEmployment.be  This test is no t yet approved or cleared by the [place] FDA and  has been authorized for detection and/or diagnosis of SARS-CoV-2 by FDA under an Emergency Use Authorization (EUA). This EUA will remain  in effect (meaning this test can be used) for the duration of the COVID-19 declaration under Section 564(b)(1) of the Act, 21 U.S.C.section 360bbb-3(b)(1), unless the authorization is terminated  or revoked sooner.       Influenza A by PCR NEGATIVE NEGATIVE Final   Influenza B by PCR NEGATIVE  NEGATIVE Final    Comment: (NOTE) The Xpert Xpress SARS-CoV-2/FLU/RSV plus assay is intended as an aid in the diagnosis of influenza from Nasopharyngeal swab specimens and should not be used as a sole basis for treatment. Nasal washings and aspirates are unacceptable for Xpert Xpress SARS-CoV-2/FLU/RSV testing.  Fact Sheet for Patients: EntrepreneurPulse.com.au  Fact Sheet for Healthcare Providers: IncredibleEmployment.be  This test is not yet approved or cleared by the [place] FDA and has been authorized for detection and/or diagnosis of SARS-CoV-2 by FDA under an Emergency Use Authorization (EUA). This EUA will remain in effect (meaning this test can be used) for the duration of the COVID-19 declaration under Section 564(b)(1) of the Act, 21 U.S.C. section 360bbb-3(b)(1), unless the authorization is terminated or revoked.  Performed at [facility], [place] [address] [place], [place] [address]   Resp Panel by RT-PCR (Flu A&B, Covid) Nasopharyngeal Swab     Status: None   Collection Time: 09/09/21  3:53 PM   Specimen: Nasopharyngeal Swab; Nasopharyngeal(NP) swabs in vial transport medium  Result Value Ref Range Status   SARS Coronavirus 2 by RT PCR NEGATIVE NEGATIVE Final    Comment: (NOTE) SARS-CoV-2 target nucleic acids are NOT DETECTED.  The SARS-CoV-2 RNA is generally detectable in  upper respiratory specimens during the acute phase of infection. The lowest concentration of SARS-CoV-2 viral copies this assay can detect is 138 copies/mL. A negative result does not preclude SARS-Cov-2 infection and should not be used as the sole basis for treatment or other patient management decisions. A negative result may occur with  improper specimen collection/handling, submission of specimen other than nasopharyngeal swab, presence of viral mutation(s) within the areas targeted by this assay, and inadequate number of viral copies(<138 copies/mL). A negative result must be combined with clinical observations, patient history, and epidemiological information. The expected result is Negative.  Fact Sheet for Patients:  EntrepreneurPulse.com.au  Fact Sheet for Healthcare Providers:  IncredibleEmployment.be  This test is no t yet approved or cleared by the [place] FDA and  has been authorized for detection and/or diagnosis of SARS-CoV-2 by FDA under an Emergency Use Authorization (EUA). This EUA will remain  in effect (meaning this test can be used) for the duration of the COVID-19 declaration under Section 564(b)(1) of the Act, 21 U.S.C.section 360bbb-3(b)(1), unless the authorization is terminated  or revoked sooner.       Influenza A by PCR NEGATIVE NEGATIVE Final   Influenza B by PCR NEGATIVE NEGATIVE Final    Comment: (NOTE) The Xpert Xpress SARS-CoV-2/FLU/RSV plus assay is intended as an aid in the diagnosis of influenza from Nasopharyngeal swab specimens and should not be used as a sole basis for treatment. Nasal washings and aspirates are unacceptable for Xpert Xpress SARS-CoV-2/FLU/RSV testing.  Fact Sheet for Patients: EntrepreneurPulse.com.au  Fact Sheet for Healthcare Providers: IncredibleEmployment.be  This test is not yet approved or cleared by the [place] FDA and has been  authorized for detection and/or diagnosis of SARS-CoV-2 by FDA under an Emergency Use Authorization (EUA). This EUA will remain in effect (meaning this test can be used) for the duration of the COVID-19 declaration under Section 564(b)(1) of the Act, 21 U.S.C. section 360bbb-3(b)(1), unless the authorization is terminated or revoked.  Performed at [facility], [place] [address] [place], [place] [address]   Resp Panel by RT-PCR (Flu A&B, Covid) Nasopharyngeal Swab     Status: None   Collection Time: 09/13/21 12:34 AM   Specimen: Nasopharyngeal Swab;  Nasopharyngeal(NP) swabs in vial transport medium  Result Value Ref Range Status   SARS Coronavirus 2 by RT PCR NEGATIVE NEGATIVE Final    Comment: (NOTE) SARS-CoV-2 target nucleic acids are NOT DETECTED.  The SARS-CoV-2 RNA is generally detectable in upper respiratory specimens during the acute phase of infection. The lowest concentration of SARS-CoV-2 viral copies this assay can detect is 138 copies/mL. A negative result does not preclude SARS-Cov-2 infection and should not be used as the sole basis for treatment or other patient management decisions. A negative result may occur with  improper specimen collection/handling, submission of specimen other than nasopharyngeal swab, presence of viral mutation(s) within the areas targeted by this assay, and inadequate number of viral copies(<138 copies/mL). A negative result must be combined with clinical observations, patient history, and epidemiological information. The expected result is Negative.  Fact Sheet for Patients:  EntrepreneurPulse.com.au  Fact Sheet for Healthcare Providers:  IncredibleEmployment.be  This test is no t yet approved or cleared by the [place] FDA and  has been authorized for detection and/or diagnosis of SARS-CoV-2 by FDA under an Emergency Use Authorization (EUA). This EUA will remain  in effect (meaning this test  can be used) for the duration of the COVID-19 declaration under Section 564(b)(1) of the Act, 21 U.S.C.section 360bbb-3(b)(1), unless the authorization is terminated  or revoked sooner.       Influenza A by PCR NEGATIVE NEGATIVE Final   Influenza B by PCR NEGATIVE NEGATIVE Final    Comment: (NOTE) The Xpert Xpress SARS-CoV-2/FLU/RSV plus assay is intended as an aid in the diagnosis of influenza from Nasopharyngeal swab specimens and should not be used as a sole basis for treatment. Nasal washings and aspirates are unacceptable for Xpert Xpress SARS-CoV-2/FLU/RSV testing.  Fact Sheet for Patients: EntrepreneurPulse.com.au  Fact Sheet for Healthcare Providers: IncredibleEmployment.be  This test is not yet approved or cleared by the [place] FDA and has been authorized for detection and/or diagnosis of SARS-CoV-2 by FDA under an Emergency Use Authorization (EUA). This EUA will remain in effect (meaning this test can be used) for the duration of the COVID-19 declaration under Section 564(b)(1) of the Act, 21 U.S.C. section 360bbb-3(b)(1), unless the authorization is terminated or revoked.  Performed at [facility], [place] [address], [place], [place] [address]   Surgical pcr screen     Status: None   Collection Time: 09/13/21 12:28 PM   Specimen: Nasal Mucosa; Nasal Swab  Result Value Ref Range Status   MRSA, PCR NEGATIVE NEGATIVE Final   Staphylococcus aureus NEGATIVE NEGATIVE Final    Comment: (NOTE) The Xpert SA Assay (FDA approved for NASAL specimens in patients 51 years of age and older), is one component of a comprehensive surveillance program. It is not intended to diagnose infection nor to guide or monitor treatment. Performed at [facility], [place] [address] [place], [place] [address]   Culture, blood (routine x 2)     Status: None (Preliminary result)   Collection Time: 09/13/21 11:45 PM   Specimen: BLOOD RIGHT  HAND  Result Value Ref Range Status   Specimen Description BLOOD RIGHT HAND  Final   Special Requests   Final    BOTTLES DRAWN AEROBIC AND ANAEROBIC Blood Culture adequate volume   Culture   Final    NO GROWTH 1 DAY Performed at [facility], [place] [address], [place], [place] [address]    Report Status PENDING  Incomplete  Culture, blood (routine x 2)  Status: None (Preliminary result)   Collection Time: 09/13/21 11:45 PM   Specimen: BLOOD LEFT HAND  Result Value Ref Range Status   Specimen Description BLOOD LEFT HAND  Final   Special Requests   Final    BOTTLES DRAWN AEROBIC AND ANAEROBIC Blood Culture adequate volume   Culture   Final    NO GROWTH 1 DAY Performed at [facility], [place] [address] [place], [place] [address]    Report Status PENDING  Incomplete    Alcide Evener, [facility] [phone] pager  09/15/2021, 11:01 AM

## 2021-09-15 NOTE — Discharge Instructions (Addendum)
Information on my medicine - ELIQUIS (apixaban)  This medication education was reviewed with me or my healthcare representative as part of my discharge preparation.  The pharmacist that spoke with me during my hospital stay was:    Why was Eliquis prescribed for you? Eliquis was prescribed for you to reduce the risk of a blood clot forming that can cause a stroke if you have a medical condition called atrial fibrillation (a type of irregular heartbeat).  What do You need to know about Eliquis ? Take your Eliquis TWICE DAILY - one tablet in the morning and one tablet in the evening with or without food. If you have difficulty swallowing the tablet whole please discuss with your pharmacist how to take the medication safely.  Take Eliquis exactly as prescribed by your doctor and DO NOT stop taking Eliquis without talking to the doctor who prescribed the medication.  Stopping may increase your risk of developing a stroke.  Refill your prescription before you run out.  After discharge, you should have regular check-up appointments with your healthcare provider that is prescribing your Eliquis.  In the future your dose may need to be changed if your kidney function or weight changes by a significant amount or as you get older.  What do you do if you miss a dose? If you miss a dose, take it as soon as you remember on the same day and resume taking twice daily.  Do not take more than one dose of ELIQUIS at the same time to make up a missed dose.  Important Safety Information A possible side effect of Eliquis is bleeding. You should call your healthcare provider right away if you experience any of the following: Bleeding from an injury or your nose that does not stop. Unusual colored urine (red or dark brown) or unusual colored stools (red or black). Unusual bruising for unknown reasons. A serious fall or if you hit your head (even if there is no bleeding).  Some medicines may interact with  Eliquis and might increase your risk of bleeding or clotting while on Eliquis. To help avoid this, consult your healthcare provider or pharmacist prior to using any new prescription or non-prescription medications, including herbals, vitamins, non-steroidal anti-inflammatory drugs (NSAIDs) and supplements.  This website has more information on Eliquis (apixaban): http://www.eliquis.com/eliquis/home   Additional discharge instructions  Please get your medications reviewed and adjusted by your Primary MD.  Please request your Primary MD to go over all Hospital Tests and Procedure/Radiological results at the follow up, please get all Hospital records sent to your Prim MD by signing hospital release before you go home.  If you had Pneumonia of Lung problems at the Hospital: Please get a 2 view Chest X ray done in 6-8 weeks after hospital discharge or sooner if instructed by your Primary MD.  If you have Congestive Heart Failure: Please call your Cardiologist or Primary MD anytime you have any of the following symptoms:  1) 3 pound weight gain in 24 hours or 5 pounds in 1 week  2) shortness of breath, with or without a dry hacking cough  3) swelling in the hands, feet or stomach  4) if you have to sleep on extra pillows at night in order to breathe  Follow cardiac low salt diet and 1.5 lit/day fluid restriction.  If you have diabetes Accuchecks 4 times/day, Once in AM empty stomach and then before each meal. Log in all results and show them to your primary doctor at  your next visit. If any glucose reading is under 80 or above 300 call your primary MD immediately.  If you have Seizure/Convulsions/Epilepsy: Please do not drive, operate heavy machinery, participate in activities at heights or participate in high speed sports until you have seen by Primary MD or a Neurologist and advised to do so again.  If you had Gastrointestinal Bleeding: Please ask your Primary MD to check a complete  blood count within one week of discharge or at your next visit. Your endoscopic/colonoscopic biopsies that are pending at the time of discharge, will also need to followed by your Primary MD.  Get Medicines reviewed and adjusted. Please take all your medications with you for your next visit with your Primary MD  Please request your Primary MD to go over all hospital tests and procedure/radiological results at the follow up, please ask your Primary MD to get all Hospital records sent to his/her office.  If you experience worsening of your admission symptoms, develop shortness of breath, life threatening emergency, suicidal or homicidal thoughts you must seek medical attention immediately by calling 911 or calling your MD immediately  if symptoms less severe.  You must read complete instructions/literature along with all the possible adverse reactions/side effects for all the Medicines you take and that have been prescribed to you. Take any new Medicines after you have completely understood and accpet all the possible adverse reactions/side effects.   Do not drive or operate heavy machinery when taking Pain medications.   Do not take more than prescribed Pain, Sleep and Anxiety Medications  Special Instructions: If you have smoked or chewed Tobacco  in the last 2 yrs please stop smoking, stop any regular Alcohol  and or any Recreational drug use.  Wear Seat belts while driving.  Please note You were cared for by a hospitalist during your hospital stay. If you have any questions about your discharge medications or the care you received while you were in the hospital after you are discharged, you can call the unit and asked to speak with the hospitalist on call if the hospitalist that took care of you is not available. Once you are discharged, your primary care physician will handle any further medical issues. Please note that NO REFILLS for any discharge medications will be authorized once you are  discharged, as it is imperative that you return to your primary care physician (or establish a relationship with a primary care physician if you do not have one) for your aftercare needs so that they can reassess your need for medications and monitor your lab values.  You can reach the hospitalist office at phone [phone] or fax [phone]   If you do not have a primary care physician, you can call [phone] for a physician referral.

## 2021-09-15 NOTE — TOC Progression Note (Signed)
Transition of Care [facility]) - Progression Note    Patient Details  Name: [name] MRN: [id number] Date of Birth: 12/28/1965  Transition of Care [facility] CM/SW Contact  Bartholomew Crews, RN Phone Number: [phone] 09/15/2021, 5:02 PM  Clinical Narrative:     Received call from patient on his cell phone. Patient shared his experience at SNF stating while PT and some staff were great - overall the nursing care was poor and his needs were not met. Patient stated that his plan was to get stronger at the hospital so he can go home with wound care at [facility] wound center using [facility] transportation, and to have [facility] PT/OT. He stated that his son, Mortimer Fries, and friend, Anderson Malta, are available to assist him.   Spoke with patient and Anderson Malta at the bedside. Discussed [place] services for RN (wound care), PT, OT, Aide, and SW (community resources). Patient in agreeament. Referral accepted by [facility]. Advised patient that with [place] he cannot have outpatient services, so HH and a teachable caregiver will do his wound care. Patient verbalized understanding. Anderson Malta will be the teachable caregiver for wound care. Will need HH orders with [facility] for RN, PT, OT, SW, and Aide.   Discussed DME needs. Patient stated that he will need a wheelchair. Discussed possibility of drop arm 3/1 and slideboard.   Demographics verified. Patient consents to discussing transition plans with all contacts listed in [place].   TOC following for transition needs.   Expected Discharge Plan: [place] Barriers to Discharge: Continued Medical Work up  Expected Discharge Plan and Services Expected Discharge Plan: [place] In-house Referral: Clinical Social Work Discharge Planning Services: CM Consult Post Acute Care Choice: Durable Medical Equipment, [place] arrangements for the past 2 months: Apartment                 DME Arranged: Programmer, multimedia, 3-N-1         HH  Arranged: RN, PT, OT, Nurse's Aide, Social Work [employer] Agency: [facility] Date [place]: 09/15/21 Time [place]: 1659 Representative spoke with at [place]: [place] ([place]) Interventions    Readmission Risk Interventions No flowsheet data found.

## 2021-09-15 NOTE — Assessment & Plan Note (Signed)
-  Continue oxycodone, p.o. Dilaudid

## 2021-09-15 NOTE — Consult Note (Signed)
[facility] Surgery Consult Note  [name] [birth year]  [id number].    Requesting MD: Marzetta Board Chief Complaint/Reason for Consult: sacral wound  HPI:  [name] is a 56yo male with multiple medical problems including Recent bacteremia due to thoracic discitis/ osteomyelitis with epidural abscess, Hx bilateral BKA, HTN, DM, CHF, PAF on eliquis who was admitted to [facility] 09/12/21 with chief complaint of worsening back pain and concerns about sacral wound. Patient had just recently had a prolonged hospitalization 07/21/2021-09/09/2021 after initially presenting in septic shock with group B streptococcal bacteremia from a prior left great toe amputation wound infection. He required a left BKA. Hospital course complicated by thoracic discitis/osteomyelitis with epidural abscess and new diagnosis of heart failure with reduced EF, atrial flutter, and sacral decubitus ulcer.   General surgery asked to evaluate sacral wound today. During his last hospitalization patient underwent hydrotherapy. Since discharge to SNF where he states that they have not been doing appropriate wound care and the wound is worsening.  ROS All systems reviewed and otherwise negative except for as above.  Family History  Problem Relation Age of Onset   Anxiety disorder Mother    Cancer Father    Coronary artery disease Father     Past Medical History:  Diagnosis Date   Chronic diastolic CHF (congestive heart failure) ([place])    Complication of anesthesia    Depression    Diabetes mellitus without complication ([place])    Hypertension     Past Surgical History:  Procedure Laterality Date   AMPUTATION Left 07/23/2021   Procedure: LEFT BELOW KNEE AMPUTATION;  Surgeon: [name], MD;  Location: [place];  Service: Orthopedics;  Laterality: Left;   BELOW KNEE LEG AMPUTATION Right    BUBBLE STUDY  07/29/2021   Procedure: BUBBLE STUDY;  Surgeon: Sueanne Margarita, MD;  Location: [place];  Service:  Cardiovascular;;   CARDIOVERSION N/A 07/29/2021   Procedure: CARDIOVERSION;  Surgeon: Sueanne Margarita, MD;  Location: [facility] ENDOSCOPY;  Service: Cardiovascular;  Laterality: N/A;   RADIOLOGY WITH ANESTHESIA N/A 08/05/2021   Procedure: MRI LUMBAR WITH AND WITHOUT; THORASIC SPINE WITH AND WITHOUT WITH ANESTHESIA;  Surgeon: Radiologist, Medication, MD;  Location: [place];  Service: Radiology;  Laterality: N/A;   RADIOLOGY WITH ANESTHESIA N/A 08/07/2021   Procedure: MRI WITH LUMBER WITH AND WITHOUT CONTRAST,THORACIC WITH AND WITHOUT CONTRAST;  Surgeon: Radiologist, Medication, MD;  Location: [place];  Service: Radiology;  Laterality: N/A;   RADIOLOGY WITH ANESTHESIA N/A 09/13/2021   Procedure: MRI WITH ANESTHESIA;  Surgeon: Luanne Bras, MD;  Location: [place];  Service: Radiology;  Laterality: N/A;   TEE WITHOUT CARDIOVERSION N/A 07/29/2021   Procedure: TRANSESOPHAGEAL ECHOCARDIOGRAM (TEE);  Surgeon: Sueanne Margarita, MD;  Location: [facility] ENDOSCOPY;  Service: Cardiovascular;  Laterality: N/A;    Social History:  reports that he has never smoked. He has never used smokeless tobacco. He reports that he does not drink alcohol and does not use drugs.  Allergies:  Allergies  Allergen Reactions   Bactrim [Sulfamethoxazole-Trimethoprim]    Ceprotin [Protein C Concentrate (Human)]    Ciprofloxacin Other (See Comments)    Kidney function   Levaquin [Levofloxacin]     Medications Prior to Admission  Medication Sig Dispense Refill   amiodarone (PACERONE) 200 MG tablet Take 1 tablet (200 mg total) by mouth daily. 30 tablet 0   apixaban (ELIQUIS) 5 MG TABS tablet Take 1 tablet (5 mg total) by mouth 2 (two) times daily. 60 tablet 0  cefadroxil (DURICEF) 500 MG capsule Take 2 capsules (1,000 mg total) by mouth 2 (two) times daily for 44 doses. (Patient taking differently: Take 1,000 mg by mouth See admin instructions. Bid x 44 doses) 88 capsule 0   Ensure Plus (ENSURE PLUS) LIQD Take 237 mLs by mouth 2 (two)  times daily between meals.     finasteride (PROSCAR) 5 MG tablet Take 1 tablet (5 mg total) by mouth daily. 30 tablet 0   gabapentin (NEURONTIN) 400 MG capsule Take 1 capsule (400 mg total) by mouth 3 (three) times daily. 90 capsule 0   insulin aspart (NOVOLOG) 100 UNIT/ML injection Inject 6 Units into the skin 3 (three) times daily with meals. 10 mL 0   insulin detemir (LEVEMIR) 100 UNIT/ML injection Inject 0.36 mLs (36 Units total) into the skin 2 (two) times daily. 10 mL 0   Insulin Glargine (LANTUS SOLOSTAR [place]) Inject 36 Units into the skin in the morning and at bedtime.     liver oil-zinc oxide (DESITIN) 40 % ointment Apply topically daily as needed for irritation. (Patient taking differently: Apply 1 application topically every 6 (six) hours as needed for irritation.) 56.7 g 0   magnesium hydroxide (MILK OF MAGNESIA) 400 MG/5ML suspension Take 30 mLs by mouth daily as needed for mild constipation.     methocarbamol 1000 MG TABS Take 1,000 mg by mouth 3 (three) times daily. 90 tablet 0   metoprolol succinate (TOPROL-XL) 25 MG 24 hr tablet Take 1 tablet (25 mg total) by mouth daily. 30 tablet 0   Multiple Vitamin (MULTIVITAMIN WITH MINERALS) TABS tablet Take 1 tablet by mouth daily. 30 tablet 0   Oxycodone HCl 10 MG TABS Take 10 mg by mouth every 8 (eight) hours as needed (chronic pain).     sodium hypochlorite (DAKIN'S 1/4 STRENGTH) 0.125 % SOLN Irrigate with as directed daily for 10 days. (Patient taking differently: Irrigate with 1 application as directed daily.) 473 mL 0   tamsulosin (FLOMAX) 0.4 MG CAPS capsule Take 1 capsule (0.4 mg total) by mouth daily. 30 capsule 0   testosterone (ANDROGEL) 50 MG/5GM (1%) GEL Place 5 g onto the skin daily. 150 g 0   testosterone (ANDROGEL) 50 MG/5GM (1%) GEL Place 5 g onto the skin daily as needed (hypogonadism).     traZODone (DESYREL) 150 MG tablet Take 1 tablet (150 mg total) by mouth at bedtime as needed for up to 15 days for sleep. 15 tablet 0    Chlorhexidine Gluconate Cloth 2 % PADS Apply 6 each topically daily for 10 days. (Patient not taking: Reported on 09/13/2021) 60 each 0   collagenase (SANTYL) ointment Apply topically daily. (Patient not taking: Reported on 09/13/2021) 15 g 0   Ensure Max Protein (ENSURE MAX PROTEIN) LIQD Take 330 mLs (11 oz total) by mouth 2 (two) times daily between meals for 10 days. (Patient not taking: Reported on 09/13/2021) 6600 mL 0   HYDROcodone-acetaminophen (NORCO/VICODIN) 5-325 MG tablet Take 1 tablet by mouth once. (Patient not taking: Reported on 09/13/2021)     nystatin cream (MYCOSTATIN) Apply topically 2 (two) times daily. (Patient not taking: Reported on 09/13/2021) 30 g 0   oxyCODONE-acetaminophen (PERCOCET/ROXICET) 5-325 MG tablet Take 2 tablets by mouth once. (Patient not taking: Reported on 09/13/2021)      Prior to Admission medications   Medication Sig Start Date End Date Taking? Authorizing Provider  amiodarone (PACERONE) 200 MG tablet Take 1 tablet (200 mg total) by mouth daily. 09/10/21 10/10/21 Yes Jamse Arn,  Kyra Searles, MD  apixaban (ELIQUIS) 5 MG TABS tablet Take 1 tablet (5 mg total) by mouth 2 (two) times daily. 09/09/21 10/09/21 Yes Vashti Hey, MD  cefadroxil (DURICEF) 500 MG capsule Take 2 capsules (1,000 mg total) by mouth 2 (two) times daily for 44 doses. Patient taking differently: Take 1,000 mg by mouth See admin instructions. Bid x 44 doses 09/09/21 10/01/21 Yes Chatterjee, Kyra Searles, MD  Ensure Plus (ENSURE PLUS) LIQD Take 237 mLs by mouth 2 (two) times daily between meals.   Yes [provider]  finasteride (PROSCAR) 5 MG tablet Take 1 tablet (5 mg total) by mouth daily. 09/10/21 10/10/21 Yes Vashti Hey, MD  gabapentin (NEURONTIN) 400 MG capsule Take 1 capsule (400 mg total) by mouth 3 (three) times daily. 09/09/21 10/09/21 Yes Bonnell Public Tublu, MD  insulin aspart (NOVOLOG) 100 UNIT/ML injection Inject 6 Units into the skin 3 (three) times daily with  meals. 09/09/21  Yes Bonnell Public Tublu, MD  insulin detemir (LEVEMIR) 100 UNIT/ML injection Inject 0.36 mLs (36 Units total) into the skin 2 (two) times daily. 09/09/21  Yes Vashti Hey, MD  Insulin Glargine (LANTUS SOLOSTAR [place]) Inject 36 Units into the skin in the morning and at bedtime.   Yes [provider]  liver oil-zinc oxide (DESITIN) 40 % ointment Apply topically daily as needed for irritation. Patient taking differently: Apply 1 application topically every 6 (six) hours as needed for irritation. 09/09/21  Yes Vashti Hey, MD  magnesium hydroxide (MILK OF MAGNESIA) 400 MG/5ML suspension Take 30 mLs by mouth daily as needed for mild constipation.   Yes [provider]  methocarbamol 1000 MG TABS Take 1,000 mg by mouth 3 (three) times daily. 09/09/21 10/09/21 Yes Vashti Hey, MD  metoprolol succinate (TOPROL-XL) 25 MG 24 hr tablet Take 1 tablet (25 mg total) by mouth daily. 09/10/21 10/10/21 Yes Vashti Hey, MD  Multiple Vitamin (MULTIVITAMIN WITH MINERALS) TABS tablet Take 1 tablet by mouth daily. 09/10/21 10/10/21 Yes Vashti Hey, MD  Oxycodone HCl 10 MG TABS Take 10 mg by mouth every 8 (eight) hours as needed (chronic pain).   Yes [provider]  sodium hypochlorite (DAKIN'S 1/4 STRENGTH) 0.125 % SOLN Irrigate with as directed daily for 10 days. Patient taking differently: Irrigate with 1 application as directed daily. 09/10/21 09/20/21 Yes Vashti Hey, MD  tamsulosin (FLOMAX) 0.4 MG CAPS capsule Take 1 capsule (0.4 mg total) by mouth daily. 09/10/21 10/10/21 Yes Vashti Hey, MD  testosterone (ANDROGEL) 50 MG/5GM (1%) GEL Place 5 g onto the skin daily. 09/10/21 10/10/21 Yes Vashti Hey, MD  testosterone (ANDROGEL) 50 MG/5GM (1%) GEL Place 5 g onto the skin daily as needed (hypogonadism).   Yes [provider]  traZODone (DESYREL) 150 MG tablet Take 1 tablet (150 mg  total) by mouth at bedtime as needed for up to 15 days for sleep. 09/09/21 09/24/21 Yes Vashti Hey, MD  Chlorhexidine Gluconate Cloth 2 % PADS Apply 6 each topically daily for 10 days. Patient not taking: Reported on 09/13/2021 09/10/21 09/20/21  Vashti Hey, MD  collagenase (SANTYL) ointment Apply topically daily. Patient not taking: Reported on 09/13/2021 09/10/21   Vashti Hey, MD  Ensure Max Protein (ENSURE MAX PROTEIN) LIQD Take 330 mLs (11 oz total) by mouth 2 (two) times daily between meals for 10 days. Patient not taking: Reported on 09/13/2021 09/09/21 09/19/21  Vashti Hey, MD  HYDROcodone-acetaminophen (NORCO/VICODIN) 5-325 MG  tablet Take 1 tablet by mouth once. Patient not taking: Reported on 09/13/2021    [provider]  nystatin cream (MYCOSTATIN) Apply topically 2 (two) times daily. Patient not taking: Reported on 09/13/2021 09/09/21   Vashti Hey, MD  oxyCODONE-acetaminophen (PERCOCET/ROXICET) 5-325 MG tablet Take 2 tablets by mouth once. Patient not taking: Reported on 09/13/2021    [provider]    Blood pressure 133/79, pulse 66, temperature 97.9 F (36.6 C), temperature source Oral, resp. rate 17, weight 109 kg, SpO2 98 %. Physical Exam: General: pleasant, WD/WN male who is laying in bed in NAD Heart: regular, rate, and rhythm.  Normal s1,s2. No obvious murmurs, gallops, or rubs noted.  Palpable pedal pulses bilaterally  Lungs: CTAB, no wheezes, rhonchi, or rales noted.  Respiratory effort nonlabored Abd: soft, NT/ND, +BS, no masses, hernias, or organomegaly Skin: warm and dry with no masses, lesions, or rashes.  Stage 3 sacral ulcer with overall good granulation tissue present.  Left side of wound has stage 2 type breakdown.  There is a small area of undermining on the right aspect.  There is no infection and bone is not palpable.  Psych: A&Ox4 with an appropriate affect  Results for orders placed or  performed during the hospital encounter of 09/12/21 (from the past 48 hour(s))  CBG monitoring, ED     Status: Abnormal   Collection Time: 09/13/21 11:42 AM  Result Value Ref Range   Glucose-Capillary 224 (H) 70 - 99 mg/dL    Comment: Glucose reference range applies only to samples taken after fasting for at least 8 hours.  Surgical pcr screen     Status: None   Collection Time: 09/13/21 12:28 PM   Specimen: Nasal Mucosa; Nasal Swab  Result Value Ref Range   MRSA, PCR NEGATIVE NEGATIVE   Staphylococcus aureus NEGATIVE NEGATIVE    Comment: (NOTE) The Xpert SA Assay (FDA approved for NASAL specimens in patients 76 years of age and older), is one component of a comprehensive surveillance program. It is not intended to diagnose infection nor to guide or monitor treatment. Performed at [facility], [place] [address] [place], [place] [address]   No blood products     Status: None   Collection Time: 09/13/21 12:34 PM  Result Value Ref Range   Transfuse no blood products      TRANSFUSE NO BLOOD PRODUCTS, VERIFIED BY RN Salley Scarlet RN on 1.7.23 at 1234.  Performed at [facility], [place] [address] [place], [place] [address]   Glucose, capillary     Status: Abnormal   Collection Time: 09/13/21  4:18 PM  Result Value Ref Range   Glucose-Capillary 64 (L) 70 - 99 mg/dL    Comment: Glucose reference range applies only to samples taken after fasting for at least 8 hours.  Glucose, capillary     Status: Abnormal   Collection Time: 09/13/21  4:48 PM  Result Value Ref Range   Glucose-Capillary 112 (H) 70 - 99 mg/dL    Comment: Glucose reference range applies only to samples taken after fasting for at least 8 hours.  CBG monitoring, ED     Status: Abnormal   Collection Time: 09/13/21  5:33 PM  Result Value Ref Range   Glucose-Capillary 115 (H) 70 - 99 mg/dL    Comment: Glucose reference range applies only to samples taken after fasting for at least 8 hours.  Glucose, capillary      Status: Abnormal   Collection Time:  09/13/21  9:31 PM  Result Value Ref Range   Glucose-Capillary 388 (H) 70 - 99 mg/dL    Comment: Glucose reference range applies only to samples taken after fasting for at least 8 hours.  Culture, blood (routine x 2)     Status: None (Preliminary result)   Collection Time: 09/13/21 11:45 PM   Specimen: BLOOD RIGHT HAND  Result Value Ref Range   Specimen Description BLOOD RIGHT HAND    Special Requests      BOTTLES DRAWN AEROBIC AND ANAEROBIC Blood Culture adequate volume   Culture      NO GROWTH 1 DAY Performed at [facility], [place] [address] [place], [place] [address]    Report Status PENDING   Culture, blood (routine x 2)     Status: None (Preliminary result)   Collection Time: 09/13/21 11:45 PM   Specimen: BLOOD LEFT HAND  Result Value Ref Range   Specimen Description BLOOD LEFT HAND    Special Requests      BOTTLES DRAWN AEROBIC AND ANAEROBIC Blood Culture adequate volume   Culture      NO GROWTH 1 DAY Performed at [facility], [place] [address] [place], [place] [address]    Report Status PENDING   CBC     Status: Abnormal   Collection Time: 09/14/21  3:18 AM  Result Value Ref Range   WBC 11.2 (H) 4.0 - 10.5 K/uL   RBC 3.94 (L) 4.22 - 5.81 MIL/uL   Hemoglobin 11.6 (L) 13.0 - 17.0 g/dL   HCT 34.9 (L) 39.0 - 52.0 %   MCV 88.6 80.0 - 100.0 fL   MCH 29.4 26.0 - 34.0 pg   MCHC 33.2 30.0 - 36.0 g/dL   RDW 14.0 11.5 - 15.5 %   Platelets 337 150 - 400 K/uL   nRBC 0.0 0.0 - 0.2 %    Comment: Performed at [facility], [place] [address] [place], [place] [address]  Basic metabolic panel     Status: Abnormal   Collection Time: 09/14/21  3:18 AM  Result Value Ref Range   Sodium 130 (L) 135 - 145 mmol/L   Potassium 4.2 3.5 - 5.1 mmol/L   Chloride 95 (L) 98 - 111 mmol/L   CO2 23 22 - 32 mmol/L   Glucose, Bld 412 (H) 70 - 99 mg/dL    Comment: Glucose reference range applies only to samples taken after fasting for at least 8  hours.   BUN 23 (H) 6 - 20 mg/dL   Creatinine, Ser 1.22 0.61 - 1.24 mg/dL   Calcium 8.4 (L) 8.9 - 10.3 mg/dL   GFR, Estimated >60 >60 mL/min    Comment: (NOTE) Calculated using the CKD-EPI Creatinine Equation (2021)    Anion gap 12 5 - 15    Comment: Performed at [place] [address], [place], [place] [address]  Glucose, capillary     Status: Abnormal   Collection Time: 09/14/21  7:44 AM  Result Value Ref Range   Glucose-Capillary 435 (H) 70 - 99 mg/dL    Comment: Glucose reference range applies only to samples taken after fasting for at least 8 hours.  Glucose, capillary     Status: Abnormal   Collection Time: 09/14/21 11:42 AM  Result Value Ref Range   Glucose-Capillary 170 (H) 70 - 99 mg/dL    Comment: Glucose reference range applies only to samples taken after fasting for at least 8 hours.  Glucose, capillary  Status: Abnormal   Collection Time: 09/14/21  4:46 PM  Result Value Ref Range   Glucose-Capillary 212 (H) 70 - 99 mg/dL    Comment: Glucose reference range applies only to samples taken after fasting for at least 8 hours.  Glucose, capillary     Status: Abnormal   Collection Time: 09/14/21  9:23 PM  Result Value Ref Range   Glucose-Capillary 242 (H) 70 - 99 mg/dL    Comment: Glucose reference range applies only to samples taken after fasting for at least 8 hours.  Basic metabolic panel     Status: Abnormal   Collection Time: 09/15/21  3:17 AM  Result Value Ref Range   Sodium 134 (L) 135 - 145 mmol/L   Potassium 4.1 3.5 - 5.1 mmol/L   Chloride 97 (L) 98 - 111 mmol/L   CO2 30 22 - 32 mmol/L   Glucose, Bld 224 (H) 70 - 99 mg/dL    Comment: Glucose reference range applies only to samples taken after fasting for at least 8 hours.   BUN 19 6 - 20 mg/dL   Creatinine, Ser 1.20 0.61 - 1.24 mg/dL   Calcium 8.8 (L) 8.9 - 10.3 mg/dL   GFR, Estimated >60 >60 mL/min    Comment: (NOTE) Calculated using the CKD-EPI Creatinine Equation (2021)    Anion gap 7 5 -  15    Comment: Performed at [facility] [address] [place], [place] [address]  CBC     Status: Abnormal   Collection Time: 09/15/21  3:17 AM  Result Value Ref Range   WBC 10.0 4.0 - 10.5 K/uL   RBC 4.10 (L) 4.22 - 5.81 MIL/uL   Hemoglobin 11.7 (L) 13.0 - 17.0 g/dL   HCT 37.1 (L) 39.0 - 52.0 %   MCV 90.5 80.0 - 100.0 fL   MCH 28.5 26.0 - 34.0 pg   MCHC 31.5 30.0 - 36.0 g/dL   RDW 14.1 11.5 - 15.5 %   Platelets 350 150 - 400 K/uL   nRBC 0.0 0.0 - 0.2 %    Comment: Performed at [facility], [place] [address] [place], [place] [address]  Glucose, capillary     Status: Abnormal   Collection Time: 09/15/21  7:23 AM  Result Value Ref Range   Glucose-Capillary 157 (H) 70 - 99 mg/dL    Comment: Glucose reference range applies only to samples taken after fasting for at least 8 hours.   MR THORACIC SPINE W WO CONTRAST  Result Date: 09/13/2021 CLINICAL DATA:  Thoracic osteomyelitis discitis. EXAM: MRI THORACIC AND LUMBAR SPINE WITHOUT AND WITH CONTRAST TECHNIQUE: Multiplanar and multiecho pulse sequences of the thoracic and lumbar spine were obtained without and with intravenous contrast. CONTRAST:  42mL GADAVIST GADOBUTROL 1 MMOL/ML IV SOLN COMPARISON:  MRI thoracic spine dated August 22, 2021. MRI thoracic and lumbar spine dated August 07, 2021. FINDINGS: MRI THORACIC SPINE FINDINGS Alignment:  Physiologic. Vertebrae: Improved T6-T7 discitis osteomyelitis with decreased pus in the disc space. T7 superior endplate erosion has also improved. Intense vertebral body and posterior element marrow edema and enhancement from T5 through T8 is not significantly changed. Cord: Decreased posterior epidural phlegmon compared to prior study without significant residual rim enhancing fluid collection. Similar ventral epidural phlegmon from T5 through T7. Continued moderate spinal canal stenosis at T6-T7 due to epidural phlegmon. Paraspinal and other soft tissues: Small bilateral pleural effusions  have decreased since the prior study. Patchy opacities in both lower lobes. Extensive paravertebral  edema and enhancement from T5 through T9 is similar. Disc levels: No additional high-grade spinal canal or neuroforaminal stenosis. MRI LUMBAR SPINE FINDINGS Segmentation:  Standard. Alignment:  Physiologic. Vertebrae: No fracture, evidence of discitis, or bone lesion. Similar acute on chronic degenerative endplate marrow signal changes at L5-S1 with minimal endplate enhancement and trace fluid in the disc space. Conus medullaris: Extends to the L1-L2 level and appears normal. No intradural enhancement. Paraspinal and other soft tissues: No paravertebral inflammatory changes. Similar bilateral paraspinous muscle edema without enhancement. Unchanged mild bilateral hydronephrosis. Unchanged complex left renal cyst. Disc levels: T12-L1: Unchanged tiny left paracentral disc protrusion. No stenosis. L1-L2:  Unchanged mild disc bulging. No stenosis. L2-L3: Unchanged tiny left paracentral disc protrusion and annular fissure. No stenosis. L3-L4:  Negative. L4-L5: Unchanged small broad-based posterior disc protrusion and mild bilateral facet arthropathy. Unchanged mild bilateral neuroforaminal stenosis. No spinal canal stenosis. L5-S1: Unchanged severe disc height loss, small circumferential disc osteophyte complex, and superimposed small central disc protrusion. Unchanged mild to moderate bilateral facet arthropathy. Unchanged moderate right greater than left neuroforaminal stenosis. No spinal canal stenosis. IMPRESSION: Thoracic spine: 1. Continued interval improvement in T6-T7 discitis osteomyelitis with decreased pus in the disc space and improved erosion of the T7 superior endplate. Decreased posterior epidural phlegmon. Continued moderate spinal canal stenosis at T6-T7 due to epidural phlegmon. 2. Extensive marrow edema and enhancement involving the T5 through T8 vertebral bodies and posterior elements is not  significantly changed and remains consistent with osteomyelitis. Extensive paravertebral edema and enhancement from T5 through T9 is also similar to prior study. 3. Small bilateral pleural effusions have decreased since the prior study. Patchy opacities in both lower lobes may be due to atelectasis or pneumonia. Lumbar spine: 1. No evidence of osteomyelitis-discitis. 2. Unchanged lumbar spondylosis as described above. Unchanged moderate right greater than left neuroforaminal stenosis at L5-S1. 3. Unchanged mild bilateral hydronephrosis. Electronically Signed   By: Titus Dubin M.D.   On: 09/13/2021 17:08   MR Lumbar Spine W Wo Contrast  Result Date: 09/13/2021 CLINICAL DATA:  Thoracic osteomyelitis discitis. EXAM: MRI THORACIC AND LUMBAR SPINE WITHOUT AND WITH CONTRAST TECHNIQUE: Multiplanar and multiecho pulse sequences of the thoracic and lumbar spine were obtained without and with intravenous contrast. CONTRAST:  68mL GADAVIST GADOBUTROL 1 MMOL/ML IV SOLN COMPARISON:  MRI thoracic spine dated August 22, 2021. MRI thoracic and lumbar spine dated August 07, 2021. FINDINGS: MRI THORACIC SPINE FINDINGS Alignment:  Physiologic. Vertebrae: Improved T6-T7 discitis osteomyelitis with decreased pus in the disc space. T7 superior endplate erosion has also improved. Intense vertebral body and posterior element marrow edema and enhancement from T5 through T8 is not significantly changed. Cord: Decreased posterior epidural phlegmon compared to prior study without significant residual rim enhancing fluid collection. Similar ventral epidural phlegmon from T5 through T7. Continued moderate spinal canal stenosis at T6-T7 due to epidural phlegmon. Paraspinal and other soft tissues: Small bilateral pleural effusions have decreased since the prior study. Patchy opacities in both lower lobes. Extensive paravertebral edema and enhancement from T5 through T9 is similar. Disc levels: No additional high-grade spinal canal or  neuroforaminal stenosis. MRI LUMBAR SPINE FINDINGS Segmentation:  Standard. Alignment:  Physiologic. Vertebrae: No fracture, evidence of discitis, or bone lesion. Similar acute on chronic degenerative endplate marrow signal changes at L5-S1 with minimal endplate enhancement and trace fluid in the disc space. Conus medullaris: Extends to the L1-L2 level and appears normal. No intradural enhancement. Paraspinal and other soft tissues: No paravertebral inflammatory changes. Similar bilateral  paraspinous muscle edema without enhancement. Unchanged mild bilateral hydronephrosis. Unchanged complex left renal cyst. Disc levels: T12-L1: Unchanged tiny left paracentral disc protrusion. No stenosis. L1-L2:  Unchanged mild disc bulging. No stenosis. L2-L3: Unchanged tiny left paracentral disc protrusion and annular fissure. No stenosis. L3-L4:  Negative. L4-L5: Unchanged small broad-based posterior disc protrusion and mild bilateral facet arthropathy. Unchanged mild bilateral neuroforaminal stenosis. No spinal canal stenosis. L5-S1: Unchanged severe disc height loss, small circumferential disc osteophyte complex, and superimposed small central disc protrusion. Unchanged mild to moderate bilateral facet arthropathy. Unchanged moderate right greater than left neuroforaminal stenosis. No spinal canal stenosis. IMPRESSION: Thoracic spine: 1. Continued interval improvement in T6-T7 discitis osteomyelitis with decreased pus in the disc space and improved erosion of the T7 superior endplate. Decreased posterior epidural phlegmon. Continued moderate spinal canal stenosis at T6-T7 due to epidural phlegmon. 2. Extensive marrow edema and enhancement involving the T5 through T8 vertebral bodies and posterior elements is not significantly changed and remains consistent with osteomyelitis. Extensive paravertebral edema and enhancement from T5 through T9 is also similar to prior study. 3. Small bilateral pleural effusions have decreased  since the prior study. Patchy opacities in both lower lobes may be due to atelectasis or pneumonia. Lumbar spine: 1. No evidence of osteomyelitis-discitis. 2. Unchanged lumbar spondylosis as described above. Unchanged moderate right greater than left neuroforaminal stenosis at L5-S1. 3. Unchanged mild bilateral hydronephrosis. Electronically Signed   By: Titus Dubin M.D.   On: 09/13/2021 17:08    Anti-infectives (From admission, onward)    Start     Dose/Rate Route Frequency Ordered Stop   09/13/21 1000  cefadroxil (DURICEF) capsule 1,000 mg        1,000 mg Oral 2 times daily 09/13/21 0456     09/13/21 0448  cefadroxil (DURICEF) capsule 1,000 mg  Status:  Discontinued        1,000 mg Oral See admin instructions 09/13/21 0449 09/13/21 0456        Assessment/Plan Sacral decubitus  - Wound is clean without signs of infection. Would not recommend any surgical intervention. Continue BID wet to dry dressing changes. Add santyl with dressing changes. Air mattress.  Can likely have a VAC placed in the wound at some point in the near future after some of the fibrin removed with santyl. -will need wound care center follow up -relayed recommendations to primary service. We will sign off, please call with questions or concerns.   ID - cefadroxil  VTE - eliquis FEN - HH/CM diet Foley - none  Hx bilateral BKA HTN DM CHF PAF - on eliquis Recent bacteremia due to thoracic discitis/ osteomyelitis with epidural abscess   Moderate Medical Decision Making  Henreitta Cea, [facility] 09/15/2021, 9:37 AM Please see Amion for pager number during day hours 7:00am-4:30pm

## 2021-09-15 NOTE — Evaluation (Signed)
Occupational Therapy Evaluation Patient Details Name: [name] MRN: [id number] DOB: [birth year] Today's Date: 09/15/2021   History of Present Illness [name] is a 56 y.o. male  presented with complaints of worsening back pain related to thoracic discitis; with medical history significant of hypertension, CHF, DM type II, right BKA (has prosthesis) and L BKA secondary to diabetic foot infection in mid November; Prolonged hospitalization at that time, 82/42/3536-09/10/4313 with complications due to infection, thoracic discitis, and Sacral pressure wound (which was noted to be infected during previous hospitalization on 12/8)   Clinical Impression   Pt reports independence at baseline with ADLs and mobility, recent prolonged admission due to complicated L diabetic foot infection and sacral pressure wound. Pt currently min-max A for ADLs, supervision for bed mobility (rolling). Pt declining sitting EOB or OOB mobility at this time due to pain with movement from sacral wound. Pt states he has ordered a gel cushion and it will be arriving on Wed (1/11) and can hopefully progress with transfers then. Provided and educated pt on bed level exercises for UB/LB that can be performed in sidelying positions. Pt able to demonstrate and verbalized understanding. Pt currently limited by pain and impairment list below, will continue to follow acutely. Recommend AIR/CIR at d/c as pt is motivated, pt is open to returning to SNF however not SNF he was placed at previously.     Recommendations for follow up therapy are one component of a multi-disciplinary discharge planning process, led by the attending physician.  Recommendations may be updated based on patient status, additional functional criteria and insurance authorization.   Follow Up Recommendations  Acute inpatient rehab (3hours/day) (AIR/CIR vs SNF)    Assistance Recommended at Discharge Frequent or constant Supervision/Assistance  Patient can return  home with the following A lot of help with walking and/or transfers;A little help with bathing/dressing/bathroom;Assistance with cooking/housework;Help with stairs or ramp for entrance    Functional Status Assessment  Patient has had a recent decline in their functional status and demonstrates the ability to make significant improvements in function in a reasonable and predictable amount of time.  Equipment Recommendations  Other (comment);None recommended by OT (defer to next venue of care)    Recommendations for Other Services Rehab consult;PT consult     Precautions / Restrictions Precautions Precautions: Fall;Back Precaution Booklet Issued: No Precaution Comments: Back precautions for comfort Restrictions Weight Bearing Restrictions: Yes LLE Weight Bearing: Non weight bearing Other Position/Activity Restrictions: NWB through distal L residual limb; BKA surgery is healing well; likely can bear weight through knee for knee plank and quadruped therex      Mobility Bed Mobility Overal bed mobility: Needs Assistance Bed Mobility: Rolling Rolling: Supervision         General bed mobility comments: pt able to roll from L to R side for comfort, unable to remain in supine for long periods of time d/t sacral wound    Transfers                   General transfer comment: deferred due to pain      Balance                                           ADL either performed or assessed with clinical judgement   ADL Overall ADL's : Needs assistance/impaired Eating/Feeding: Set up;Bed level   Grooming: Set up;Bed level  Upper Body Bathing: Set up;Bed level   Lower Body Bathing: Minimal assistance;Bed level   Upper Body Dressing : Minimal assistance;Bed level   Lower Body Dressing: Minimal assistance;Moderate assistance;Bed level Lower Body Dressing Details (indicate cue type and reason): demonstrates ability to don/doff resistance band around knees  to simulate LB dressing Toilet Transfer: Maximal assistance;BSC/3in1;Transfer board;+2 for physical assistance   Toileting- Clothing Manipulation and Hygiene: Maximal assistance;Sitting/lateral lean               Vision Baseline Vision/History: 1 Wears glasses Vision Assessment?: No apparent visual deficits     Perception     Praxis      Pertinent Vitals/Pain Pain Assessment: Faces Pain Score: 2  Faces Pain Scale: Hurts little more (pt not in pain at rest, however increased pain with movement, refuses to sit EOB or in chair due to pain) Pain Location: Back pain and sacral decub pain; low pain faces score as pt tended to decline moving much this eval due to anticipation of pain Pain Descriptors / Indicators: Grimacing;Sore Pain Intervention(s): Limited activity within patient's tolerance;Monitored during session;Premedicated before session;Repositioned     Hand Dominance Right   Extremity/Trunk Assessment Upper Extremity Assessment Upper Extremity Assessment: Overall WFL for tasks assessed   Lower Extremity Assessment Lower Extremity Assessment: Defer to PT evaluation       Communication Communication Communication: No difficulties   Cognition Arousal/Alertness: Awake/alert Behavior During Therapy: WFL for tasks assessed/performed Overall Cognitive Status: No family/caregiver present to determine baseline cognitive functioning                                       General Comments  Pt states he has a gel cusion [place] on [place] will be receiving it on Wednesday 1/11. Anticipate it will be easier for him to work on transfers/EOB/OOB mobility with gel cusion    Exercises Exercises: Other exercises General Exercises - Upper Extremity Shoulder Flexion: AROM;Both;10 reps;Theraband;Sidelying Theraband Level (Shoulder Flexion): Level 4 (Blue) General Exercises - Lower Extremity Hip ABduction/ADduction: AROM;Both;5 reps;Supine Straight Leg Raises:  AROM;Both;10 reps;Sidelying   Shoulder Instructions      Home Living Family/patient expects to be discharged to:: Skilled nursing facility Living Arrangements: Alone Available Help at Discharge: Friend(s);Available 24 hours/day Type of Home: Apartment Home Access: Elevator     Home Layout: One level     Bathroom Shower/Tub: Teacher, early years/pre: Handicapped height     Home Equipment: Grab bars - toilet;Grab bars - tub/shower;Shower seat   Additional Comments: Portions of above info gleaned from chart review, PT and OT evaluations completed in mid-Nov 2022      Prior Functioning/Environment Prior Level of Function : Needs assist;Other (comment)             Mobility Comments: was working on wheelchair transfers at rehab at [facility]; needed assist with lateral scoot transfers; Reports independence with wheelchair mobility ADLs Comments: was  going to gym        OT Problem List: Decreased strength;Decreased activity tolerance;Impaired balance (sitting and/or standing);Decreased safety awareness;Decreased knowledge of use of DME or AE;Decreased knowledge of precautions;Pain;Obesity;Increased edema;Impaired UE functional use      OT Treatment/Interventions: Self-care/ADL training;Therapeutic exercise;DME and/or AE instruction;Therapeutic activities;Patient/family education;Balance training    OT Goals(Current goals can be found in the care plan section) Acute Rehab OT Goals Patient Stated Goal: to get my gel cushion OT Goal Formulation: With patient  Time For Goal Achievement: 09/29/21 Potential to Achieve Goals: Good ADL Goals Pt Will Perform Lower Body Dressing: bed level;sitting/lateral leans;with min guard assist Pt Will Transfer to Toilet: with min assist;with +2 assist;bedside commode;anterior/posterior transfer Pt Will Perform Tub/Shower Transfer: with min assist;shower seat;anterior/posterior transfer;with transfer board  OT Frequency: Min 3X/week     Co-evaluation PT/OT/SLP Co-Evaluation/Treatment:  ((+2 for mobility progression))            AM-PAC OT "6 Clicks" Daily Activity     Outcome Measure Help from another person eating meals?: A Little Help from another person taking care of personal grooming?: A Little Help from another person toileting, which includes using toliet, bedpan, or urinal?: A Lot Help from another person bathing (including washing, rinsing, drying)?: A Lot Help from another person to put on and taking off regular upper body clothing?: A Little Help from another person to put on and taking off regular lower body clothing?: A Lot 6 Click Score: 15   End of Session Nurse Communication: Mobility status  Activity Tolerance: Patient limited by pain Patient left: in bed;with call bell/phone within reach  OT Visit Diagnosis: Pain;Muscle weakness (generalized) (M62.81) Pain - Right/Left:  (sacral wound)                Time: 8118-8677 OT Time Calculation (min): 29 min Charges:  OT General Charges $OT Visit: 1 Visit OT Evaluation $OT Eval Low Complexity: 1 Low OT Treatments $Therapeutic Activity: 8-22 mins  Lynnda Child, OTD, OTR/L Acute Rehab [phone] - Ray 09/15/2021, 1:45 PM

## 2021-09-15 NOTE — Consult Note (Addendum)
WOC Nurse Consult Note: Reason for Consult:Appreciate input from CCS today. Agree that twice daily and PRN saline dressings and collagenase are most appropriate POC at this time. Wound type:Pressure injury Pressure Injury POA: Yes Drainage (amount, consistency, odor) moderate serous Periwound:intact  Dressing procedure/placement/frequency: Agree that twice daily saline dressings and collagenase are most appropriate POC at this time.  Recommend referral to outpatient wound care center at [facility] for ongoing care recommendations and follow up.  If you agree, please refer.  [place] nursing team will not follow, but will remain available to this patient, the nursing and medical teams.  Please re-consult if needed. Thanks, Maudie Flakes, MSN, RN, [place], Arther Abbott  Pager# [phone]

## 2021-09-15 NOTE — TOC Initial Note (Signed)
Transition of Care [facility]) - Initial/Assessment Note    Patient Details  Name: [name] MRN: [id number] Date of Birth: [birth year]  [facility] CM/SW Contact:    Joanne Chars, LCSW Phone Number: 09/15/2021, 4:10 PM  Clinical Narrative:  Pt from [facility] but did not have a good experience there, called the police when the staff would not respond, will not return.    Discussed DC plan for another SNF.  Pt would be open to going to "a good one" but also talking about possible return to his apartment with [facility]  Pt concerned about wound and feels like once that is on the right track, he could manage at home for rehab.    Permission given to speak with son [name] (lives in [place]) friend [name] (lives in [place]) exwife [place] (lives in [place])                 Expected Discharge Plan:  (TBD) Barriers to Discharge: Continued Medical Work up, SNF Pending bed offer   Patient Goals and CMS Choice        Expected Discharge Plan and Services Expected Discharge Plan:  (TBD) In-house Referral: Clinical Social Work   Post Acute Care Choice:  (TBD) Living arrangements for the past 2 months: Apartment                                      Prior Living Arrangements/Services Living arrangements for the past 2 months: Apartment Lives with:: Self Patient language and need for interpreter reviewed:: Yes Do you feel safe going back to the place where you live?: Yes      Need for Family Participation in Patient Care: No (Comment) Care giver support system in place?: Yes (comment) Current home services: Other (comment) (none) Criminal Activity/Legal Involvement Pertinent to Current Situation/Hospitalization: No - Comment as needed  Activities of Daily Living Home Assistive Devices/Equipment: Prosthesis ADL Screening (condition at time of admission) Patient's cognitive ability adequate to safely complete daily activities?: Yes Is the  patient deaf or have difficulty hearing?: No Does the patient have difficulty seeing, even when wearing glasses/contacts?: No Does the patient have difficulty concentrating, remembering, or making decisions?: No Patient able to express need for assistance with ADLs?: Yes Does the patient have difficulty dressing or bathing?: Yes Independently performs ADLs?: No Communication: Independent Dressing (OT): Needs assistance Is this a change from baseline?: Pre-admission baseline Grooming: Needs assistance Is this a change from baseline?: Pre-admission baseline Feeding: Independent Bathing: Needs assistance Is this a change from baseline?: Pre-admission baseline Toileting: Needs assistance Is this a change from baseline?: Pre-admission baseline In/Out Bed: Needs assistance Is this a change from baseline?: Pre-admission baseline Walks in Home: Dependent Is this a change from baseline?: Pre-admission baseline Does the patient have difficulty walking or climbing stairs?: Yes Weakness of Legs: Both Weakness of Arms/Hands: None  Permission Sought/Granted Permission sought to share information with : Family Supports Permission granted to share information with : Yes, Verbal Permission Granted  Share Information with NAME: son [name], friend [name], ex wife [name].           Emotional Assessment Appearance:: Appears stated age Attitude/Demeanor/Rapport: Engaged Affect (typically observed): Appropriate, Pleasant Orientation: : Oriented to Self, Oriented to Place, Oriented to  Time, Oriented to Situation Alcohol / Substance Use: Not Applicable Psych Involvement: No (comment)  Admission diagnosis:  Back pain [M54.9] Stage IV  pressure ulcer of sacral region [facility]) [L89.154] Patient Active Problem List   Diagnosis Date Noted   Stage IV pressure ulcer of sacral region ([place]) 09/15/2021   Insomnia 49/67/5916   Chronic systolic CHF (congestive heart failure) ([place]) 09/14/2021   Hypogonadism  in male 09/14/2021   Chronic pain 09/14/2021   Back pain 09/13/2021   Leukocytosis 09/13/2021   Pseudohyponatremia 09/13/2021   Normocytic anemia 09/13/2021   Discitis of thoracic region    Recent bacteremia due to group B Streptococcus, Streptococcus agalactiae, thoracic discitis/osteomyelitis with epidural abscess    Sacral pressure injury of skin 38/46/6599   Systolic dysfunction    Severe sepsis with acute organ dysfunction ([place]) 07/22/2021   Chest pain 07/22/2021   AKI (acute kidney injury) ([place]) 07/22/2021   Gas gangrene of foot ([place]) 07/22/2021   Paroxysmal atrial flutter ([place]) 07/22/2021   Septic shock ([place]) 07/22/2021   Shortness of breath 05/16/2021   Chronic diastolic (congestive) heart failure ([place]) 05/16/2021   Diabetic infection of left foot ([place]) 04/17/2021   S/P bilateral BKA (below knee amputation) ([place]) 10/30/2019   Dyslipidemia 10/30/2019   Type 2 diabetes mellitus with diabetic polyneuropathy, with long-term current use of insulin ([place]) 10/12/2019   Type 2 diabetes mellitus 10/12/2019   Essential hypertension 09/25/2019   Type 2 diabetes mellitus with hyperglycemia, with long-term current use of insulin ([place]) 09/25/2019   Stage 3a chronic kidney disease ([place]) 09/25/2019   Hematuria 09/25/2019   Acquired complex renal cyst 09/25/2019   Urinary hesitancy 09/25/2019   Prosthesis adjustments 09/25/2019   Need for immunization against influenza 09/25/2019   Need for Tdap vaccination 09/25/2019   Erectile dysfunction 09/25/2019   Healthcare maintenance 09/25/2019   Pulmonary nodules 09/25/2019   Depression with anxiety 09/25/2019   PCP:  Patient, No Pcp Per (Inactive) Pharmacy:   [place] - [address] [place] [address] Phone: [phone] Fax: [phone]     Social Determinants of Health (SDOH) Interventions    Readmission Risk Interventions No flowsheet data  found.

## 2021-09-15 NOTE — Progress Notes (Signed)
Orthopedic Tech Progress Note Patient Details:  [name] October 08, 1965 [id number]  Patient ID: [name], male   DOB: Jul 07, 1966, 56 y.o.   MRN: [id number] Contacted RN to let her know that the patients current bed is not compatible with out overhead frames. If an OHF is still wanted he will need to be placed in a different bed.  [name] 09/15/2021, 2:56 PM

## 2021-09-16 ENCOUNTER — Telehealth: Payer: Self-pay | Admitting: Orthopedic Surgery

## 2021-09-16 LAB — GLUCOSE, CAPILLARY
Glucose-Capillary: 186 mg/dL — ABNORMAL HIGH (ref 70–99)
Glucose-Capillary: 245 mg/dL — ABNORMAL HIGH (ref 70–99)
Glucose-Capillary: 210 mg/dL — ABNORMAL HIGH (ref 70–99)
Glucose-Capillary: 231 mg/dL — ABNORMAL HIGH (ref 70–99)

## 2021-09-16 LAB — COMPREHENSIVE METABOLIC PANEL
ALT: 54 U/L — ABNORMAL HIGH (ref 0–44)
AST: 25 U/L (ref 15–41)
Albumin: 1.9 g/dL — ABNORMAL LOW (ref 3.5–5.0)
Alkaline Phosphatase: 102 U/L (ref 38–126)
Anion gap: 7 (ref 5–15)
BUN: 25 mg/dL — ABNORMAL HIGH (ref 6–20)
CO2: 28 mmol/L (ref 22–32)
Calcium: 8.3 mg/dL — ABNORMAL LOW (ref 8.9–10.3)
Chloride: 97 mmol/L — ABNORMAL LOW (ref 98–111)
Creatinine, Ser: 1.32 mg/dL — ABNORMAL HIGH (ref 0.61–1.24)
GFR, Estimated: >60 mL/min (ref >60)
Glucose, Bld: 285 mg/dL — ABNORMAL HIGH (ref 70–99)
Potassium: 4.3 mmol/L (ref 3.5–5.1)
Sodium: 132 mmol/L — ABNORMAL LOW (ref 135–145)
Total Bilirubin: 0.2 mg/dL — ABNORMAL LOW (ref 0.3–1.2)
Total Protein: 5.9 g/dL — ABNORMAL LOW (ref 6.5–8.1)

## 2021-09-16 LAB — CBC
HCT: 33.6 % — ABNORMAL LOW (ref 39.0–52.0)
Hemoglobin: 11.1 g/dL — ABNORMAL LOW (ref 13.0–17.0)
MCH: 29.5 pg (ref 26.0–34.0)
MCHC: 33 g/dL (ref 30.0–36.0)
MCV: 89.4 fL (ref 80.0–100.0)
Platelets: 344 10*3/uL (ref 150–400)
RBC: 3.76 MIL/uL — ABNORMAL LOW (ref 4.22–5.81)
RDW: 14.1 % (ref 11.5–15.5)
WBC: 11.1 10*3/uL — ABNORMAL HIGH (ref 4.0–10.5)
nRBC: 0 % (ref 0.0–0.2)

## 2021-09-16 NOTE — Progress Notes (Addendum)
Occupational Therapy Treatment Patient Details Name: [name] MRN: [id number] DOB: 27-Aug-1966 Today's Date: 09/16/2021   History of present illness Herchel Hopkin is a 56 y.o. male  presented with complaints of worsening back pain related to thoracic discitis; with medical history significant of hypertension, CHF, DM type II, right BKA (has prosthesis) and L BKA secondary to diabetic foot infection in mid November; Prolonged hospitalization at that time, 53/97/6734-09/15/3788 with complications due to infection, thoracic discitis, and Sacral pressure wound (which was noted to be infected during previous hospitalization on 12/8)   OT comments  Pt progressing towards goals, able to perform bed level ADLs. Pt with continued pain from sacral pressure wound, declining OOB/EOB activity until he receives gel cushion on Wednesday 1/11. Pt able to perform rolling L/R with supervision, able to tolerate HOB to ~75 degrees upright in long sitting for ~2 minutes before experiencing sacral pain. Encouraged pt to sit in upright position for meals and/or as able to help with mobility. Pt able to perform theraband exercises provided during evaluation without difficulty. Pt demonstrates good UB strength and mobility at bed level, anticipate pt will progress with transfers once he has gel cushion to sit upright for longer periods of time. Pt presenting with impairments listed below, will continue to follow acutely. Pt wanting to d/c home at this time, continue to recommend SNF since transfers and OOB ADLs have not been attempted, however anticipate pt can progress to home with HHOT level.    Recommendations for follow up therapy are one component of a multi-disciplinary discharge planning process, led by the attending physician.  Recommendations may be updated based on patient status, additional functional criteria and insurance authorization.    Follow Up Recommendations  Skilled nursing-short term rehab (<3  hours/day) (SNF pending pt progress, anticipate once pt is able to practice transfers he will be able to go home with [facility])    Assistance Recommended at Discharge Set up Supervision/Assistance  Patient can return home with the following  A lot of help with walking and/or transfers;A lot of help with bathing/dressing/bathroom;Help with stairs or ramp for entrance;Assistance with cooking/housework   Equipment Recommendations       Recommendations for Other Services Rehab consult;PT consult    Precautions / Restrictions Precautions Precautions: Fall;Back Precaution Booklet Issued: No Precaution Comments: Back precautions for comfort Other Brace: Has R prosthesis, however not in hospital; Pt reports poor fit Restrictions Weight Bearing Restrictions: No LLE Weight Bearing: Non weight bearing Other Position/Activity Restrictions: NWB through distal L residual limb; BKA surgery is healing well; likely can bear weight through knee for knee plank and quadruped therex       Mobility Bed Mobility Overal bed mobility: Needs Assistance Bed Mobility: Rolling Rolling: Supervision         General bed mobility comments: Pt able to roll R/L side, positioned on L side for comfort with support of pillows    Transfers                   General transfer comment: deferred due to pain     Balance Overall balance assessment: Needs assistance Sitting-balance support: Single extremity supported;Bilateral upper extremity supported Sitting balance-Leahy Scale: Poor Sitting balance - Comments: unable to fully assess unsupported due to pt not being able to sit EOB, able to sit in bed chair position       Standing balance comment: Unable  ADL either performed or assessed with clinical judgement   ADL   Eating/Feeding: Set up;Bed level   Grooming: Set up;Bed level                     Toilet Transfer Details (indicate cue type and reason):  able to roll to sidelying for simulated bed pan placement Toileting- Clothing Manipulation and Hygiene: Bed level;Supervision/safety Toileting - Clothing Manipulation Details (indicate cue type and reason): Pt able to manage catheter lying supine in bed            Extremity/Trunk Assessment Upper Extremity Assessment Upper Extremity Assessment: Overall WFL for tasks assessed   Lower Extremity Assessment Lower Extremity Assessment: Defer to PT evaluation        Vision   Vision Assessment?: No apparent visual deficits   Perception Perception Perception: Not tested   Praxis Praxis Praxis: Not tested    Cognition Arousal/Alertness: Awake/alert Behavior During Therapy: [facility] for tasks assessed/performed                                              Exercises Exercises: Other exercises General Exercises - Upper Extremity Shoulder Flexion: AROM;Both;10 reps;Theraband;Sidelying Theraband Level (Shoulder Flexion): Level 4 (Blue) Shoulder Horizontal ABduction: AROM;Both;10 reps;Supine Other Exercises Other Exercises: pulls self upward toward HOB bed x2 during session   Shoulder Instructions       General Comments Pt states he has a gel cusion ordered on [place], will be receiving it on Wednesday 1/11. Anticipate it will be easier for him to work on transfers/EOB/OOB mobility with gel cusion    Pertinent Vitals/ Pain       Pain Assessment: Faces Pain Score: 3  Faces Pain Scale: Hurts little more Breathing: normal Negative Vocalization: none Facial Expression: smiling or inexpressive Body Language: relaxed Consolability: no need to console PAINAD Score: 0 Pain Location: Back pain and sacral decub pain; increased pain with sitting upright in bed Pain Descriptors / Indicators: Grimacing;Sore Pain Intervention(s): Limited activity within patient's tolerance;Monitored during session;Premedicated before session;Repositioned  Home Living                                           Prior Functioning/Environment              Frequency  Min 3X/week        Progress Toward Goals  OT Goals(current goals can now be found in the care plan section)  Progress towards OT goals: Progressing toward goals  Acute Rehab OT Goals Patient Stated Goal: to get gel cushion OT Goal Formulation: With patient Time For Goal Achievement: 09/29/21 Potential to Achieve Goals: Good ADL Goals Pt Will Perform Grooming: sitting;with supervision Pt Will Perform Upper Body Bathing: with supervision;sitting Pt Will Perform Lower Body Dressing: bed level;sitting/lateral leans;with min guard assist Pt Will Transfer to Toilet: with min assist;with +2 assist;bedside commode;anterior/posterior transfer Pt Will Perform Tub/Shower Transfer: with min assist;shower seat;anterior/posterior transfer;with transfer board  Plan Frequency remains appropriate;Discharge plan needs to be updated    Co-evaluation                 AM-PAC OT "6 Clicks" Daily Activity     Outcome Measure   Help from another person eating meals?: None Help from another person  taking care of personal grooming?: None Help from another person toileting, which includes using toliet, bedpan, or urinal?: A Lot Help from another person bathing (including washing, rinsing, drying)?: A Lot Help from another person to put on and taking off regular upper body clothing?: A Little Help from another person to put on and taking off regular lower body clothing?: A Lot 6 Click Score: 17    End of Session    OT Visit Diagnosis: Pain;Muscle weakness (generalized) (M62.81)   Activity Tolerance Patient limited by pain   Patient Left in bed;with call bell/phone within reach   Nurse Communication Mobility status        Time: 3825-0539 OT Time Calculation (min): 25 min  Charges: OT General Charges $OT Visit: 1 Visit OT Treatments $Therapeutic Activity: 23-37 mins  Lynnda Child,  OTD, OTR/L Acute Rehab [phone] - [place] 09/16/2021, 4:41 PM

## 2021-09-16 NOTE — Plan of Care (Signed)

## 2021-09-16 NOTE — Progress Notes (Signed)
PROGRESS NOTE  [name] KGY:[id number] DOB: [birth year] DOA: 09/12/2021 PCP: Patient, No Pcp Per (Inactive)   LOS: 1 day   Brief Narrative / Interim history: 56 year old male with history of HTN, chronic systolic CHF, DM2, right BKA due to diabetic foot infection, comes into the hospital with worsening back pain. He recently had a prolonged hospitalization 11/14 2022-09/09/2021 with left foot infection status post BKA 07/23/21, hospital course complicated by Group B strep bacteremia with discitis/osteomyelitis with epidural abscess, new diagnosis of systolic CHF, paroxysmal a flutter, sacral decubitus ulcers.  He went to [facility] and rehab and he reports that had a terrible experience with the nursing staff, his antibiotics were missed as well as pain medications.  Developed subjective fevers/sweats, worsening back pain and was sent back to the hospital.  He does not want to go back there.  On admission he was found to have an elevated CRP at 10.4, slight leukocytosis of 11.7.  Subjective / 24h Interval events: His pain is better controlled.  No chest discomfort/shortness of breath, no abdominal pain, nausea or vomiting.  Assessment & Plan: * Recent bacteremia due to group B Streptococcus, Streptococcus agalactiae, thoracic discitis/osteomyelitis with epidural abscess- (present on admission) -This was diagnosed at his prior hospitalization.  MRI done 1/7 on admission showed continued interval improvement in T6-7 discitis/osteomyelitis with decreased p.o. in the disc space and improved erosion of the T7 superior endplate.  There is also decreased posterior epidural phlegmon and persistent moderate canal stenosis.  -He was on vancomycin from admission 11/14 for 6 weeks through 12/26, currently on cefadroxil 1000 twice daily up until 1/23 -ID consulted, appreciate input.  Continue oral antibiotics for now.  Dr. Drucilla Schmidt saw patient 1/9   Sacral pressure injury of skin- (present on  admission) -Noted to be infected during previous hospitalization on 12/8 and was treated with a total of 7 days of cefepime.  Wound care has been consulted during the last hospitalization and he was receiving Hydro therapy. -Wound care and general surgery consulted, no need for surgical debridement as wound appears fairly good.  Continue local care per wound with twice daily saline dressing and collagenase.  Continue IV Dilaudid with wound manipulation since it is extremely painful -He plans to go home and have family take care of the wound, he needs to designate someone who can come to the hospital to get teaching  Pseudohyponatremia- (present on admission) - Due to high CBGs, stable  Chronic systolic CHF (congestive heart failure) ([name]) - He currently appears euvolemic.  Continue metoprolol  Normocytic anemia- (present on admission) - Due to chronic disease, no bleeding, continue to monitor  S/P bilateral BKA (below knee amputation) ([name]) -during prior hospital stay, wound healing well  Essential hypertension- (present on admission) - Continue metoprolol  Paroxysmal atrial flutter ([name])- (present on admission) -He is seen normal sinus rhythm since DCCV 07/2021. Continue amiodarone and Eliquis  Stage IV pressure ulcer of sacral region [facility])- (present on admission) - See discussion above between surgery and wound care  Chronic pain -Continue oxycodone, p.o. Dilaudid  Hypogonadism in male -Continue testosterone  Insomnia - Trazodone as needed  Leukocytosis- (present on admission) -Possibly related to recent missed doses of antibiotics. Recheck blood cultures, currently no growth to date.  WBC normal this morning  Discitis of thoracic region- (present on admission) - See above  Type 2 diabetes mellitus- (present on admission) -Continue insulin regimen as below, he is hyperglycemic.  Monitor CBGs and adjust insulin as needed.  CBGs stable  CBG (last 3)  Recent Labs     09/15/21 1611 09/15/21 2148 09/16/21 0823  GLUCAP 115* 206* 186*    Urinary hesitancy- (present on admission) -During prior hospitalization patient had retention type symptoms requiring repeated in and out caths. Continue finasteride and tamsulosin  Stage 3a chronic kidney disease ([name])- (present on admission) -Baseline creatinine ranging 1.0-1.4, currently at baseline    Scheduled Meds:  amiodarone  200 mg Oral Daily   apixaban  5 mg Oral BID   cefadroxil  1,000 mg Oral BID   Chlorhexidine Gluconate Cloth  6 each Topical Q0600   collagenase   Topical Daily   finasteride  5 mg Oral Daily   gabapentin  400 mg Oral TID   insulin aspart  0-9 Units Subcutaneous TID WC   insulin aspart  6 Units Subcutaneous TID WC   insulin glargine-yfgn  36 Units Subcutaneous BID   metoprolol succinate  25 mg Oral Daily   oxyCODONE  40 mg Oral Q12H   tamsulosin  0.4 mg Oral Daily   testosterone  5 g Transdermal Daily   Continuous Infusions: PRN Meds:.HYDROmorphone (DILAUDID) injection, HYDROmorphone, LORazepam, LORazepam, traZODone  Diet Orders (From admission, onward)     Start     Ordered   09/13/21 1802  Diet heart healthy/carb modified Room service appropriate? Yes; Fluid consistency: Thin  Diet effective now       Question Answer Comment  Diet-HS Snack? Nothing   Room service appropriate? Yes   Fluid consistency: Thin      09/13/21 1801            DVT prophylaxis:  apixaban (ELIQUIS) tablet 5 mg   Lab Results  Component Value Date   PLT 344 09/16/2021      Code Status: Prior  Family Communication: No family at bedside  Status is: Inpatient   Level of care: Med-Surg  Consultants:  General surgery ID  Procedures:  none  Microbiology  Blood cultures 1/7-no growth to date  Antimicrobials: Cefadroxil   Objective: Vitals:   09/15/21 1510 09/15/21 2150 09/16/21 0545 09/16/21 0820  BP: (!) 100/53 (!) 107/53 121/75 139/71  Pulse: 76 81 68 75  Resp: 14 18  16 16   Temp: 98.3 F (36.8 C) 97.7 F (36.5 C) (!) 97.4 F (36.3 C) 98.5 F (36.9 C)  TempSrc: Oral Oral Oral Oral  SpO2: 96% 95% 97% 97%  Weight:        Intake/Output Summary (Last 24 hours) at 09/16/2021 1127 Last data filed at 09/16/2021 0555 Gross per 24 hour  Intake 120 ml  Output 1750 ml  Net -1630 ml   Wt Readings from Last 3 Encounters:  09/14/21 109 kg  08/01/21 107 kg  05/16/21 108.9 kg    Examination:  Constitutional: No distress Eyes: No scleral icterus ENMT: Moist mucous membranes Neck: normal, supple Respiratory: Clear bilaterally, no wheezing Cardiovascular: Regular rate and rhythm, no murmurs Abdomen: Soft, NT, ND, bowel sounds positive Musculoskeletal: no clubbing / cyanosis.  Skin:  stage IV ulcer as as imaged, no new rashes Neurologic: Nonfocal  Data Reviewed: I have independently reviewed following labs and imaging studies   CBC Recent Labs  Lab 09/13/21 0034 09/14/21 0318 09/15/21 0317 09/16/21 0206  WBC 11.7* 11.2* 10.0 11.1*  HGB 12.7* 11.6* 11.7* 11.1*  HCT 38.4* 34.9* 37.1* 33.6*  PLT 313 337 350 344  MCV 88.7 88.6 90.5 89.4  MCH 29.3 29.4 28.5 29.5  MCHC 33.1 33.2 31.5 33.0  RDW 13.7 14.0  14.1 14.1  LYMPHSABS 0.5*  --   --   --   MONOABS 0.8  --   --   --   EOSABS 0.1  --   --   --   BASOSABS 0.0  --   --   --     Recent Labs  Lab 09/13/21 0034 09/14/21 0318 09/15/21 0317 09/16/21 0206  NA 130* 130* 134* 132*  K 4.0 4.2 4.1 4.3  CL 97* 95* 97* 97*  CO2 22 23 30 28   GLUCOSE 264* 412* 224* 285*  BUN 14 23* 19 25*  CREATININE 1.09 1.22 1.20 1.32*  CALCIUM 8.8* 8.4* 8.8* 8.3*  AST  --   --   --  25  ALT  --   --   --  54*  ALKPHOS  --   --   --  102  BILITOT  --   --   --  0.2*  ALBUMIN  --   --   --  1.9*  CRP 10.4*  --   --   --     ------------------------------------------------------------------------------------------------------------------ No results for input(s): CHOL, HDL, LDLCALC, TRIG, CHOLHDL,  LDLDIRECT in the last 72 hours.  Lab Results  Component Value Date   HGBA1C 13.8 (H) 07/22/2021   ------------------------------------------------------------------------------------------------------------------ No results for input(s): TSH, T4TOTAL, T3FREE, THYROIDAB in the last 72 hours.  Invalid input(s): FREET3  Cardiac Enzymes No results for input(s): CKMB, TROPONINI, MYOGLOBIN in the last 168 hours.  Invalid input(s): CK ------------------------------------------------------------------------------------------------------------------    Component Value Date/Time   BNP 1,368.1 (H) 07/27/2021 0126    CBG: Recent Labs  Lab 09/15/21 0723 09/15/21 1105 09/15/21 1611 09/15/21 2148 09/16/21 0823  GLUCAP 157* 179* 115* 206* 186*    Recent Results (from the past 240 hour(s))  Resp Panel by RT-PCR (Flu A&B, Covid) Nasopharyngeal Swab     Status: None   Collection Time: 09/09/21  3:53 PM   Specimen: Nasopharyngeal Swab; Nasopharyngeal(NP) swabs in vial transport medium  Result Value Ref Range Status   SARS Coronavirus 2 by RT PCR NEGATIVE NEGATIVE Final    Comment: (NOTE) SARS-CoV-2 target nucleic acids are NOT DETECTED.  The SARS-CoV-2 RNA is generally detectable in upper respiratory specimens during the acute phase of infection. The lowest concentration of SARS-CoV-2 viral copies this assay can detect is 138 copies/mL. A negative result does not preclude SARS-Cov-2 infection and should not be used as the sole basis for treatment or other patient management decisions. A negative result may occur with  improper specimen collection/handling, submission of specimen other than nasopharyngeal swab, presence of viral mutation(s) within the areas targeted by this assay, and inadequate number of viral copies(<138 copies/mL). A negative result must be combined with clinical observations, patient history, and epidemiological information. The expected result is Negative.  Fact  Sheet for Patients:  EntrepreneurPulse.com.au  Fact Sheet for Healthcare Providers:  IncredibleEmployment.be  This test is no t yet approved or cleared by the [place] FDA and  has been authorized for detection and/or diagnosis of SARS-CoV-2 by FDA under an Emergency Use Authorization (EUA). This EUA will remain  in effect (meaning this test can be used) for the duration of the COVID-19 declaration under Section 564(b)(1) of the Act, 21 U.S.C.section 360bbb-3(b)(1), unless the authorization is terminated  or revoked sooner.       Influenza A by PCR NEGATIVE NEGATIVE Final   Influenza B by PCR NEGATIVE NEGATIVE Final    Comment: (NOTE) The Xpert Xpress SARS-CoV-2/FLU/RSV plus assay  is intended as an aid in the diagnosis of influenza from Nasopharyngeal swab specimens and should not be used as a sole basis for treatment. Nasal washings and aspirates are unacceptable for Xpert Xpress SARS-CoV-2/FLU/RSV testing.  Fact Sheet for Patients: EntrepreneurPulse.com.au  Fact Sheet for Healthcare Providers: IncredibleEmployment.be  This test is not yet approved or cleared by the [place] FDA and has been authorized for detection and/or diagnosis of SARS-CoV-2 by FDA under an Emergency Use Authorization (EUA). This EUA will remain in effect (meaning this test can be used) for the duration of the COVID-19 declaration under Section 564(b)(1) of the Act, 21 U.S.C. section 360bbb-3(b)(1), unless the authorization is terminated or revoked.  Performed at [facility], [place] [address] [place], [place] [address]   Resp Panel by RT-PCR (Flu A&B, Covid) Nasopharyngeal Swab     Status: None   Collection Time: 09/13/21 12:34 AM   Specimen: Nasopharyngeal Swab; Nasopharyngeal(NP) swabs in vial transport medium  Result Value Ref Range Status   SARS Coronavirus 2 by RT PCR NEGATIVE NEGATIVE Final    Comment:  (NOTE) SARS-CoV-2 target nucleic acids are NOT DETECTED.  The SARS-CoV-2 RNA is generally detectable in upper respiratory specimens during the acute phase of infection. The lowest concentration of SARS-CoV-2 viral copies this assay can detect is 138 copies/mL. A negative result does not preclude SARS-Cov-2 infection and should not be used as the sole basis for treatment or other patient management decisions. A negative result may occur with  improper specimen collection/handling, submission of specimen other than nasopharyngeal swab, presence of viral mutation(s) within the areas targeted by this assay, and inadequate number of viral copies(<138 copies/mL). A negative result must be combined with clinical observations, patient history, and epidemiological information. The expected result is Negative.  Fact Sheet for Patients:  EntrepreneurPulse.com.au  Fact Sheet for Healthcare Providers:  IncredibleEmployment.be  This test is no t yet approved or cleared by the [place] FDA and  has been authorized for detection and/or diagnosis of SARS-CoV-2 by FDA under an Emergency Use Authorization (EUA). This EUA will remain  in effect (meaning this test can be used) for the duration of the COVID-19 declaration under Section 564(b)(1) of the Act, 21 U.S.C.section 360bbb-3(b)(1), unless the authorization is terminated  or revoked sooner.       Influenza A by PCR NEGATIVE NEGATIVE Final   Influenza B by PCR NEGATIVE NEGATIVE Final    Comment: (NOTE) The Xpert Xpress SARS-CoV-2/FLU/RSV plus assay is intended as an aid in the diagnosis of influenza from Nasopharyngeal swab specimens and should not be used as a sole basis for treatment. Nasal washings and aspirates are unacceptable for Xpert Xpress SARS-CoV-2/FLU/RSV testing.  Fact Sheet for Patients: EntrepreneurPulse.com.au  Fact Sheet for Healthcare  Providers: IncredibleEmployment.be  This test is not yet approved or cleared by the [place] FDA and has been authorized for detection and/or diagnosis of SARS-CoV-2 by FDA under an Emergency Use Authorization (EUA). This EUA will remain in effect (meaning this test can be used) for the duration of the COVID-19 declaration under Section 564(b)(1) of the Act, 21 U.S.C. section 360bbb-3(b)(1), unless the authorization is terminated or revoked.  Performed at [facility], [place] [address] [place], [place] [address]   Surgical pcr screen     Status: None   Collection Time: 09/13/21 12:28 PM   Specimen: Nasal Mucosa; Nasal Swab  Result Value Ref Range Status   MRSA, PCR NEGATIVE NEGATIVE Final   Staphylococcus aureus NEGATIVE  NEGATIVE Final    Comment: (NOTE) The Xpert SA Assay (FDA approved for NASAL specimens in patients 59 years of age and older), is one component of a comprehensive surveillance program. It is not intended to diagnose infection nor to guide or monitor treatment. Performed at [facility], [place] [address], [place], [place] [address]   Culture, blood (routine x 2)     Status: None (Preliminary result)   Collection Time: 09/13/21 11:45 PM   Specimen: BLOOD RIGHT HAND  Result Value Ref Range Status   Specimen Description BLOOD RIGHT HAND  Final   Special Requests   Final    BOTTLES DRAWN AEROBIC AND ANAEROBIC Blood Culture adequate volume   Culture   Final    NO GROWTH 2 DAYS Performed at [facility], [place] [address], [place], [place] [address]    Report Status PENDING  Incomplete  Culture, blood (routine x 2)     Status: None (Preliminary result)   Collection Time: 09/13/21 11:45 PM   Specimen: BLOOD LEFT HAND  Result Value Ref Range Status   Specimen Description BLOOD LEFT HAND  Final   Special Requests   Final    BOTTLES DRAWN AEROBIC AND ANAEROBIC Blood Culture adequate volume   Culture   Final    NO GROWTH 2  DAYS Performed at [facility], [place] [address] [place], [place] [address]    Report Status PENDING  Incomplete      Radiology Studies: No results found.   Marzetta Board, MD, PhD [facility]  Between 7 am - 7 pm I am available, please contact me via Amion (for emergencies) or Securechat (non urgent messages)  Between 7 pm - 7 am I am not available, please contact night coverage MD/APP via Amion

## 2021-09-16 NOTE — Consult Note (Signed)
° °  [facility] Inpatient Consult   09/16/2021  Dragon Thrush [birth year] [id number]  [place] Organization [ACO] Patient: [employer]  No PCP noted  Came by to speak with patient however is working with therapy at this time.    For additional questions or referrals please contact:    Natividad Brood, RN BSN [facility] Liaison  [phone] business mobile phone Toll free office [phone]  Fax number: [phone] [email or web address]

## 2021-09-16 NOTE — Plan of Care (Signed)

## 2021-09-16 NOTE — Progress Notes (Addendum)
Inpatient Diabetes Program Recommendations  AACE/ADA: New Consensus Statement on Inpatient Glycemic Control (2015)  Target Ranges:  Prepandial:   less than 140 mg/dL      Peak postprandial:   less than 180 mg/dL (1-2 hours)      Critically ill patients:  140 - 180 mg/dL   Lab Results  Component Value Date   GLUCAP 245 (H) 09/16/2021   HGBA1C 13.8 (H) 07/22/2021    Review of Glycemic Control  Latest Reference Range & Units 09/16/21 08:23 09/16/21 11:53  Glucose-Capillary 70 - 99 mg/dL 186 (H) 245 (H)  (H): Data is abnormally high  Current orders for Inpatient glycemic control: Semglee 36 units QD, Novolog 0-9 units TID and Novolog 6 units TID  Inpatient Diabetes Program Recommendations:    Novolog 0-5 units QHS  Will continue to follow while inpatient.  Thank you, Reche Dixon, MSN, RN Diabetes Coordinator Inpatient Diabetes Program [phone] (team pager from 8a-5p)

## 2021-09-16 NOTE — Plan of Care (Signed)
  Problem: Elimination: Goal: Will not experience complications related to urinary retention Outcome: Progressing   Problem: Pain Managment: Goal: General experience of comfort will improve Outcome: Progressing   Problem: Safety: Goal: Ability to remain free from injury will improve Outcome: Progressing   

## 2021-09-16 NOTE — Progress Notes (Signed)
Pt stated that he would like to wait until hydro therapy came by at 1330 before he had his dressing changed. Pt was informed that the order stated to have the site cleansed and dressed twice daily and as needed, however, pt wishes to wait until hydro therapy comes to get it done.

## 2021-09-16 NOTE — Telephone Encounter (Signed)
Pt called to inform Dr. Sharol Given he is in the hospital and has a post op appt this Friday. Pt is asking to have his appt at [facility] for he will still be hospitalized. Pt phone number is [phone].

## 2021-09-16 NOTE — Progress Notes (Signed)
Physical Therapy Wound Treatment Patient Details  Name: [name] MRN: [id number] Date of Birth: 1966-08-16  Today's Date: 09/16/2021 Time: 5631-4970 Time Calculation (min): 33 min  Subjective  Subjective Assessment Subjective: Pt pleasant and agreeable to hydrotherapy Patient and Family Stated Goals: Return home with home health and not back to SNF Date of Onset:  (PTA) Prior Treatments: Dressing changes, prior hydrotherapy  Pain Score:  Pt premedicated and did not complain of pain at all during session.   Wound Assessment  Pressure Injury 07/27/21 Sacrum Mid Unstageable - Full thickness tissue loss in which the base of the injury is covered by slough (yellow, tan, gray, green or brown) and/or eschar (tan, brown or black) in the wound bed. small pink pressure ulcer with bre (Active)  Dressing Type ABD;Barrier Film (skin prep);Gauze (Comment);Moist to moist;Santyl 09/16/21 1434  Dressing Changed;Clean;Dry;Intact 09/16/21 1434  Dressing Change Frequency Daily 09/16/21 1434  State of Healing Early/partial granulation 09/16/21 1434  Site / Wound Assessment Pink;Red;Yellow 09/16/21 1434  % Wound base Red or Granulating 45% 09/16/21 1434  % Wound base Yellow/Fibrinous Exudate 55% 09/16/21 1434  % Wound base Black/Eschar 0% 09/16/21 1434  % Wound base Other/Granulation Tissue (Comment) 0% 09/16/21 1434  Peri-wound Assessment Intact;Pink 09/16/21 1434  Wound Length (cm) 6.5 cm 09/15/21 1137  Wound Width (cm) 6.5 cm 09/15/21 1137  Wound Depth (cm) 3 cm 09/15/21 1137  Wound Surface Area (cm^2) 42.25 cm^2 09/15/21 1137  Wound Volume (cm^3) 126.75 cm^3 09/15/21 1137  Tunneling (cm) 0 09/15/21 1300  Undermining (cm) 3.3 cm 12:00 09/15/21 1137  Margins Unattached edges (unapproximated) 09/16/21 1434  Drainage Amount Minimal 09/16/21 1434  Drainage Description Serosanguineous 09/16/21 1434  Treatment Debridement (Selective);Hydrotherapy (Pulse lavage);Packing (Saline gauze) 09/16/21 1434       Hydrotherapy Pulsed lavage therapy - wound location: Sacrum Pulsed Lavage with Suction (psi): 8 psi Pulsed Lavage with Suction - Normal Saline Used: 1000 mL Pulsed Lavage Tip: Tip with splash shield Selective Debridement Selective Debridement - Location: Sacrum Selective Debridement - Tools Used: Forceps, Scalpel Selective Debridement - Tissue Removed: yellow adherent necrotic tissue and slough    Wound Assessment and Plan  Wound Therapy - Assess/Plan/Recommendations Wound Therapy - Clinical Statement: Progressing with debridement. Noted bony prominence palpable superiomedial area of the wound. This patient will benefit from continued hydrotherapy for selective removal of unviable tissue, to decrease bioburden, and promote wound bed healing. Wound Therapy - Functional Problem List: Global weakness Factors Delaying/Impairing Wound Healing: Diabetes Mellitus, Incontinence, Infection - systemic/local, Immobility, Multiple medical problems Hydrotherapy Plan: Debridement, Dressing change, Patient/family education, Pulsatile lavage with suction Wound Therapy - Frequency: 6X / week Wound Therapy - Current Recommendations: PT Wound Therapy - Follow Up Recommendations: dressing changes by family/patient  Wound Therapy Goals- Improve the function of patient's integumentary system by progressing the wound(s) through the phases of wound healing (inflammation - proliferation - remodeling) by: Wound Therapy Goals - Improve the function of patient's integumentary system by progressing the wound(s) through the phases of wound healing by: Decrease Necrotic Tissue to: 20% Decrease Necrotic Tissue - Progress: Progressing toward goal Increase Granulation Tissue to: 80% Increase Granulation Tissue - Progress: Progressing toward goal Goals/treatment plan/discharge plan were made with and agreed upon by patient/family: Yes Time For Goal Achievement: 7 days Wound Therapy - Potential for Goals:  Good  Goals will be updated until maximal potential achieved or discharge criteria met.  Discharge criteria: when goals achieved, discharge from hospital, MD decision/surgical intervention, no progress towards goals, refusal/missing three consecutive  treatments without notification or medical reason.  GP     Charges PT Wound Care Charges $Wound Debridement up to 20 cm: < or equal to 20 cm $PT Hydrotherapy Dressing: 1 dressing $PT PLS Gun and Tip: 1 Supply $PT Hydrotherapy Visit: 1 Visit       [name] 09/16/2021, 2:48 PM  Rolinda Roan, PT, DPT Acute Rehabilitation Services Pager: [phone] Office: [phone]

## 2021-09-16 NOTE — Telephone Encounter (Signed)
Dr. Sharol Given aware pt is in hospital added to list to see

## 2021-09-17 ENCOUNTER — Other Ambulatory Visit: Payer: Self-pay | Admitting: Nurse Practitioner

## 2021-09-17 DIAGNOSIS — G894 Chronic pain syndrome: Secondary | ICD-10-CM

## 2021-09-17 LAB — GLUCOSE, CAPILLARY
Glucose-Capillary: 143 mg/dL — ABNORMAL HIGH (ref 70–99)
Glucose-Capillary: 334 mg/dL — ABNORMAL HIGH (ref 70–99)
Glucose-Capillary: 270 mg/dL — ABNORMAL HIGH (ref 70–99)
Glucose-Capillary: 112 mg/dL — ABNORMAL HIGH (ref 70–99)
Glucose-Capillary: 168 mg/dL — ABNORMAL HIGH (ref 70–99)

## 2021-09-17 LAB — CBC
HCT: 35.1 % — ABNORMAL LOW (ref 39.0–52.0)
Hemoglobin: 11.3 g/dL — ABNORMAL LOW (ref 13.0–17.0)
MCH: 29 pg (ref 26.0–34.0)
MCHC: 32.2 g/dL (ref 30.0–36.0)
MCV: 90.2 fL (ref 80.0–100.0)
Platelets: 359 10*3/uL (ref 150–400)
RBC: 3.89 MIL/uL — ABNORMAL LOW (ref 4.22–5.81)
RDW: 14.1 % (ref 11.5–15.5)
WBC: 12.8 10*3/uL — ABNORMAL HIGH (ref 4.0–10.5)
nRBC: 0 % (ref 0.0–0.2)

## 2021-09-17 LAB — BASIC METABOLIC PANEL
Anion gap: 7 (ref 5–15)
BUN: 18 mg/dL (ref 6–20)
CO2: 27 mmol/L (ref 22–32)
Calcium: 8.8 mg/dL — ABNORMAL LOW (ref 8.9–10.3)
Chloride: 100 mmol/L (ref 98–111)
Creatinine, Ser: 1.16 mg/dL (ref 0.61–1.24)
GFR, Estimated: >60 mL/min (ref >60)
Glucose, Bld: 151 mg/dL — ABNORMAL HIGH (ref 70–99)
Potassium: 4.1 mmol/L (ref 3.5–5.1)
Sodium: 134 mmol/L — ABNORMAL LOW (ref 135–145)

## 2021-09-17 MED ORDER — TAMSULOSIN HCL 0.4 MG PO CAPS
0.4000 mg | ORAL_CAPSULE | Freq: Every day | ORAL | 1 refills | Status: AC
Start: 2021-09-17 — End: 2021-11-16

## 2021-09-17 MED ORDER — INSULIN PEN NEEDLE 32G X 4 MM MISC
1 refills | Status: DC
Start: 2021-09-17 — End: 2022-12-23

## 2021-09-17 MED ORDER — TESTOSTERONE 50 MG/5GM (1%) TD GEL: 5.0000 g | Freq: Every day | TRANSDERMAL | 0 refills | Status: DC

## 2021-09-17 MED ORDER — AMIODARONE HCL 200 MG PO TABS
200.0000 mg | ORAL_TABLET | Freq: Every day | ORAL | 1 refills | Status: DC
Start: 2021-09-17 — End: 2021-12-05

## 2021-09-17 MED ORDER — CEFADROXIL 500 MG PO CAPS: 1000.0000 mg | ORAL_CAPSULE | Freq: Two times a day (BID) | ORAL | 0 refills | Status: AC

## 2021-09-17 MED ORDER — HYDROMORPHONE HCL 4 MG PO TABS: 4.0000 mg | ORAL_TABLET | Freq: Four times a day (QID) | ORAL | 0 refills | Status: DC | PRN

## 2021-09-17 MED ORDER — OXYCODONE HCL ER 40 MG PO T12A: 40.0000 mg | EXTENDED_RELEASE_TABLET | Freq: Two times a day (BID) | ORAL | 0 refills | Status: DC

## 2021-09-17 MED ORDER — COLLAGENASE 250 UNIT/GM EX OINT: TOPICAL_OINTMENT | Freq: Every day | CUTANEOUS | 0 refills | Status: DC

## 2021-09-17 MED ORDER — TRAZODONE HCL 150 MG PO TABS: 150.0000 mg | ORAL_TABLET | Freq: Every evening | ORAL | 0 refills | Status: DC | PRN

## 2021-09-17 MED ORDER — METOPROLOL SUCCINATE ER 25 MG PO TB24: 25.0000 mg | ORAL_TABLET | Freq: Every day | ORAL | 1 refills | Status: DC

## 2021-09-17 MED ORDER — LANTUS SOLOSTAR 100 UNIT/ML ~~LOC~~ SOPN: 36.0000 [IU] | PEN_INJECTOR | Freq: Two times a day (BID) | SUBCUTANEOUS | 1 refills | Status: DC

## 2021-09-17 MED ORDER — APIXABAN 5 MG PO TABS: 5.0000 mg | ORAL_TABLET | Freq: Two times a day (BID) | ORAL | 1 refills | Status: DC

## 2021-09-17 MED ORDER — GABAPENTIN 400 MG PO CAPS: 400.0000 mg | ORAL_CAPSULE | Freq: Three times a day (TID) | ORAL | 1 refills | Status: DC

## 2021-09-17 MED ORDER — INSULIN ASPART 100 UNIT/ML IJ SOLN: 6.0000 [IU] | Freq: Three times a day (TID) | INTRAMUSCULAR | 0 refills | Status: DC

## 2021-09-17 MED ORDER — FINASTERIDE 5 MG PO TABS: 5.0000 mg | ORAL_TABLET | Freq: Every day | ORAL | 1 refills | Status: AC

## 2021-09-17 NOTE — Progress Notes (Cosign Needed Addendum)
°  °  Durable Medical Equipment  (From admission, onward)           Start     Ordered   09/17/21 0857  For home use only DME lightweight manual wheelchair with seat cushion  Once       Comments: Patient suffers from  BKA, sacral decubitus ulcers which impairs their ability to perform daily activities like bathing in the home.  A walker will not resolve  issue with performing activities of daily living. A wheelchair will allow patient to safely perform daily activities. Patient is not able to propel themselves in the home using a standard weight wheelchair due to general weakness. Patient can self propel in the lightweight wheelchair. Length of need 12 months . Accessories: elevating leg rests (ELRs), wheel locks, extensions and anti-tippers.   09/17/21 4497

## 2021-09-17 NOTE — Progress Notes (Signed)
Physical Therapy Wound Treatment Patient Details  Name: [name] MRN: [id number] Date of Birth: 01-Sep-1966  Today's Date: 09/17/2021 Time: 2229-7989 Time Calculation (min): 45 min  Subjective  Subjective Assessment Subjective: Pt pleasant and agreeable to hydrotherapy Patient and Family Stated Goals: Return home with home health and not back to SNF Date of Onset:  (PTA) Prior Treatments: Dressing changes, prior hydrotherapy  Pain Score:  Pt did not report any pain throughout session.   Wound Assessment  Pressure Injury 07/27/21 Sacrum Mid Unstageable - Full thickness tissue loss in which the base of the injury is covered by slough (yellow, tan, gray, green or brown) and/or eschar (tan, brown or black) in the wound bed. small pink pressure ulcer with bre (Active)  Dressing Type ABD;Barrier Film (skin prep);Gauze (Comment);Moist to moist;Santyl 09/17/21 1500  Dressing Changed;Clean;Dry;Intact 09/17/21 1500  Dressing Change Frequency Daily 09/17/21 1500  State of Healing Early/partial granulation 09/17/21 1500  Site / Wound Assessment Red;Pink;Yellow 09/17/21 1500  % Wound base Red or Granulating 60% 09/17/21 1500  % Wound base Yellow/Fibrinous Exudate 40% 09/17/21 1500  % Wound base Black/Eschar 0% 09/17/21 1500  % Wound base Other/Granulation Tissue (Comment) 0% 09/17/21 1500  Peri-wound Assessment Intact 09/17/21 1500  Wound Length (cm) 6.5 cm 09/15/21 1137  Wound Width (cm) 6.5 cm 09/15/21 1137  Wound Depth (cm) 3 cm 09/15/21 1137  Wound Surface Area (cm^2) 42.25 cm^2 09/15/21 1137  Wound Volume (cm^3) 126.75 cm^3 09/15/21 1137  Tunneling (cm) 0 09/15/21 1300  Undermining (cm) 3.3 cm 12:00 09/15/21 1137  Margins Unattached edges (unapproximated) 09/17/21 1500  Drainage Amount Minimal 09/17/21 1500  Drainage Description Serosanguineous 09/17/21 1500  Treatment Debridement (Selective);Hydrotherapy (Pulse lavage);Packing (Saline gauze) 09/17/21 1500       Hydrotherapy Pulsed lavage therapy - wound location: Sacrum Pulsed Lavage with Suction (psi): 8 psi Pulsed Lavage with Suction - Normal Saline Used: 1000 mL Pulsed Lavage Tip: Tip with splash shield Selective Debridement Selective Debridement - Location: Sacrum Selective Debridement - Tools Used: Forceps, Scalpel Selective Debridement - Tissue Removed: yellow necrotic tissue and slough    Wound Assessment and Plan  Wound Therapy - Assess/Plan/Recommendations Wound Therapy - Clinical Statement: Progressing with debridement. Pt's friend present for education on dressing changes and pt/friend report understanding. This patient will benefit from continued hydrotherapy for selective removal of unviable tissue, to decrease bioburden, and promote wound bed healing. Wound Therapy - Functional Problem List: Global weakness Factors Delaying/Impairing Wound Healing: Diabetes Mellitus, Incontinence, Infection - systemic/local, Immobility, Multiple medical problems Hydrotherapy Plan: Debridement, Dressing change, Patient/family education, Pulsatile lavage with suction Wound Therapy - Frequency: 6X / week Wound Therapy - Current Recommendations: PT Wound Therapy - Follow Up Recommendations: dressing changes by family/patient  Wound Therapy Goals- Improve the function of patient's integumentary system by progressing the wound(s) through the phases of wound healing (inflammation - proliferation - remodeling) by: Wound Therapy Goals - Improve the function of patient's integumentary system by progressing the wound(s) through the phases of wound healing by: Decrease Necrotic Tissue to: 20% Decrease Necrotic Tissue - Progress: Progressing toward goal Increase Granulation Tissue to: 80% Increase Granulation Tissue - Progress: Progressing toward goal Goals/treatment plan/discharge plan were made with and agreed upon by patient/family: Yes Time For Goal Achievement: 7 days Wound Therapy - Potential for  Goals: Good  Goals will be updated until maximal potential achieved or discharge criteria met.  Discharge criteria: when goals achieved, discharge from hospital, MD decision/surgical intervention, no progress towards goals, refusal/missing three consecutive treatments without  notification or medical reason.  GP     Charges PT Wound Care Charges $Wound Debridement up to 20 cm: < or equal to 20 cm $PT Hydrotherapy Dressing: 1 dressing $PT PLS Gun and Tip: 1 Supply $PT Hydrotherapy Visit: 1 Visit       [name] 09/17/2021, 3:26 PM  Rolinda Roan, PT, DPT Acute Rehabilitation Services Pager: [phone] Office: [phone]

## 2021-09-17 NOTE — NC FL2 (Signed)
[place] LEVEL OF CARE SCREENING TOOL     IDENTIFICATION  Patient Name: [name] Birthdate: 04/17/1966 Sex: male Admission Date (Current Location): 09/12/2021  [facility] and [place] Number:  Herbalist and Address:  [facility], [place] [address] [place],  [address]      Provider Number: [id number]  Attending Physician Name and Address:  Modena Jansky, MD  Relative Name and Phone Number:  Perlie, Scheuring (Son)   [phone]    Current Level of Care: Hospital Recommended Level of Care: [place] Prior Approval Number:    Date Approved/Denied:   PASRR Number: [id number]  Discharge Plan: SNF    Current Diagnoses: Patient Active Problem List   Diagnosis Date Noted   Stage IV pressure ulcer of sacral region ([place]) 09/15/2021   Insomnia 17/61/6073   Chronic systolic CHF (congestive heart failure) ([place]) 09/14/2021   Hypogonadism in male 09/14/2021   Chronic pain 09/14/2021   Back pain 09/13/2021   Leukocytosis 09/13/2021   Pseudohyponatremia 09/13/2021   Normocytic anemia 09/13/2021   Discitis of thoracic region    Recent bacteremia due to group B Streptococcus, Streptococcus agalactiae, thoracic discitis/osteomyelitis with epidural abscess    Sacral pressure injury of skin 71/02/2693   Systolic dysfunction    Severe sepsis with acute organ dysfunction ([place]) 07/22/2021   Chest pain 07/22/2021   AKI (acute kidney injury) ([place]) 07/22/2021   Gas gangrene of foot ([place]) 07/22/2021   Paroxysmal atrial flutter ([place]) 07/22/2021   Septic shock ([place]) 07/22/2021   Shortness of breath 05/16/2021   Chronic diastolic (congestive) heart failure ([place]) 05/16/2021   Diabetic infection of left foot ([place]) 04/17/2021   S/P bilateral BKA (below knee amputation) ([place]) 10/30/2019   Dyslipidemia 10/30/2019   Type 2 diabetes mellitus with diabetic polyneuropathy, with long-term current use of insulin ([place]) 10/12/2019    Type 2 diabetes mellitus 10/12/2019   Essential hypertension 09/25/2019   Type 2 diabetes mellitus with hyperglycemia, with long-term current use of insulin ([place]) 09/25/2019   Stage 3a chronic kidney disease ([place]) 09/25/2019   Hematuria 09/25/2019   Acquired complex renal cyst 09/25/2019   Urinary hesitancy 09/25/2019   Prosthesis adjustments 09/25/2019   Need for immunization against influenza 09/25/2019   Need for Tdap vaccination 09/25/2019   Erectile dysfunction 09/25/2019   Healthcare maintenance 09/25/2019   Pulmonary nodules 09/25/2019   Depression with anxiety 09/25/2019    Orientation RESPIRATION BLADDER Height & Weight     Self, Time, Situation, Place  Normal Incontinent, External catheter Weight: 240 lb 4.8 oz (109 kg) Height:     BEHAVIORAL SYMPTOMS/MOOD NEUROLOGICAL BOWEL NUTRITION STATUS      Continent Diet (see discharge summary)  AMBULATORY STATUS COMMUNICATION OF NEEDS Skin   Total Care Verbally Other (Comment) (sacral wound)                       Personal Care Assistance Level of Assistance  Bathing, Feeding, Dressing Bathing Assistance: Limited assistance Feeding assistance: Independent Dressing Assistance: Independent     Functional Limitations Info  Sight, Hearing, Speech Sight Info: Adequate Hearing Info: Adequate Speech Info: Adequate    SPECIAL CARE FACTORS FREQUENCY  PT (By licensed PT), OT (By licensed OT)     PT Frequency: 5x week OT Frequency: 5x week            Contractures Contractures Info: Not present    Additional Factors Info  Code Status, Allergies, Insulin Sliding  Scale Code Status Info: full Allergies Info: Bactrim (Sulfamethoxazole-trimethoprim), Ceprotin (Protein C Concentrate (Human)), Ciprofloxacin, Levaquin (Levofloxacin   Insulin Sliding Scale Info: Novolog, 0-9 units, 3x day with meals, 0-6 units 3x day with meals.  See discharge summary.       Current Medications (09/17/2021):  This is the current hospital  active medication list Current Facility-Administered Medications  Medication Dose Route Frequency Provider Last Rate Last Admin   amiodarone (PACERONE) tablet 200 mg  200 mg Oral Daily Tamala Julian, Rondell A, MD   200 mg at 09/17/21 0905   apixaban (ELIQUIS) tablet 5 mg  5 mg Oral BID Regan Lemming, MD   5 mg at 09/17/21 0905   cefadroxil (DURICEF) capsule 1,000 mg  1,000 mg Oral BID Regan Lemming, MD   1,000 mg at 09/17/21 5277   Chlorhexidine Gluconate Cloth 2 % PADS 6 each  6 each Topical Q0600 Caren Griffins, MD   6 each at 09/17/21 0552   collagenase (SANTYL) ointment   Topical Daily Saverio Danker, PA-C   Given at 09/15/21 1229   finasteride (PROSCAR) tablet 5 mg  5 mg Oral Daily Fuller Plan A, MD   5 mg at 09/17/21 0904   gabapentin (NEURONTIN) capsule 400 mg  400 mg Oral TID Caren Griffins, MD   400 mg at 09/17/21 0904   HYDROmorphone (DILAUDID) injection 1.5 mg  1.5 mg Intravenous Q8H PRN Caren Griffins, MD   1.5 mg at 09/17/21 1311   HYDROmorphone (DILAUDID) tablet 4 mg  4 mg Oral Q4H PRN Caren Griffins, MD   4 mg at 09/17/21 0651   insulin aspart (novoLOG) injection 0-9 Units  0-9 Units Subcutaneous TID WC Smith, Rondell A, MD   5 Units at 09/17/21 1310   insulin aspart (novoLOG) injection 6 Units  6 Units Subcutaneous TID WC Regan Lemming, MD   6 Units at 09/17/21 1309   insulin glargine-yfgn (SEMGLEE) injection 36 Units  36 Units Subcutaneous BID Fuller Plan A, MD   36 Units at 09/17/21 0903   LORazepam (ATIVAN) injection 0.5 mg  0.5 mg Intravenous PRN Regan Lemming, MD       LORazepam (ATIVAN) tablet 0.5 mg  0.5 mg Oral PRN Regan Lemming, MD       metoprolol succinate (TOPROL-XL) 24 hr tablet 25 mg  25 mg Oral Daily Regan Lemming, MD   25 mg at 09/17/21 0905   oxyCODONE (OXYCONTIN) 12 hr tablet 40 mg  40 mg Oral Q12H Smith, Rondell A, MD   40 mg at 09/17/21 0904   tamsulosin (FLOMAX) capsule 0.4 mg  0.4 mg Oral Daily Tamala Julian, Rondell A, MD   0.4 mg at 09/17/21 8242    testosterone (ANDROGEL) 50 MG/5GM (1%) gel 5 g  5 g Transdermal Daily Caren Griffins, MD   5 g at 09/17/21 0902   traZODone (DESYREL) tablet 150 mg  150 mg Oral QHS PRN Regan Lemming, MD   150 mg at 09/16/21 2211     Discharge Medications: Please see discharge summary for a list of discharge medications.  Relevant Imaging Results:  Relevant Lab Results:   Additional Information SSN: [id number]  Joanne Chars, LCSW

## 2021-09-17 NOTE — Progress Notes (Addendum)
PT Cancellation Note  Patient Details Name: [name] MRN: [id number] DOB: 04-28-1966   Cancelled Treatment:    Reason Eval/Treat Not Completed: Other (comment)  Planned ahead with pt and Danielle, RN for timing of PT session, with the goal of performing bed>wheelchair>bed transfers in prep for dc home;   Unable to perform transfers at this time due to unfortunate timing of BM;  Will make a concerted effort to come back at 1pm, and do the work of transfers before PT Hydrtherapy;  Have discussed dc needs with TOC RN;   Practice wheelchair and cushion ar in the room, and ready to practice;   Roney Marion, [place] Pager [phone] Office [phone]    Colletta [place] 09/17/2021, 12:36 PM

## 2021-09-17 NOTE — Plan of Care (Signed)

## 2021-09-17 NOTE — Progress Notes (Signed)
Physical Therapy Treatment Patient Details Name: [name] MRN: [id number] DOB: [birth year] Today's Date: 09/17/2021   History of Present Illness Giovan Pinsky is a 56 y.o. male  presented with complaints of worsening back pain related to thoracic discitis; with medical history significant of hypertension, CHF, DM type II, right BKA (has prosthesis) and L BKA secondary to diabetic foot infection in mid November; Prolonged hospitalization at that time, 48/18/5631-12/14/7024 with complications due to infection, thoracic discitis, and Sacral pressure wound (which was noted to be infected during previous hospitalization on 12/8)    PT Comments    Pt tolerates treatment well, demonstrating good determination this session. Pt requires significant assistance when transferring from wheelchair to bed due to large height difference in surfaces (PT deflates bed and lowers bed to lowest position but bed remains ~8 inches higher than wheelchair surface). Pt demonstrates improved transfer technique from bed to wheelchair, however fatigues with mobility. Pt is now more agreeable to possibility of SNF placement. Pt will benefit from continued acute PT services to aide in restoring independence in mobility at a wheelchair level.   Recommendations for follow up therapy are one component of a multi-disciplinary discharge planning process, led by the attending physician.  Recommendations may be updated based on patient status, additional functional criteria and insurance authorization.  Follow Up Recommendations  Skilled nursing-short term rehab (<3 hours/day)     Assistance Recommended at Discharge Intermittent Supervision/Assistance  Patient can return home with the following A lot of help with walking and/or transfers   Equipment Recommendations  Wheelchair (measurements PT);Wheelchair cushion (measurements PT);Hospital bed;Other (comment);BSC/3in1    Recommendations for Other Services       Precautions  / Restrictions Precautions Precautions: Fall;Back Precaution Booklet Issued: No Precaution Comments: Back precautions for comfort Other Brace: Prostheses are not in the room Restrictions Weight Bearing Restrictions: No LLE Weight Bearing: Non weight bearing Other Position/Activity Restrictions: NWB through distal L residual limb; BKA surgery is healing well; likely can bear weight through knee for knee plank and quadruped therex     Mobility  Bed Mobility Overal bed mobility: Needs Assistance Bed Mobility: Supine to Sit;Sit to Supine;Rolling Rolling: Min guard Sidelying to sit: Min guard Supine to sit: Min guard;HOB elevated Sit to supine: Mod assist   General bed mobility comments: modA for LE management when returning to bed, complicated transfer when returning    Transfers Overall transfer level: Needs assistance Equipment used: None Transfers: Bed to chair/wheelchair/BSC            Lateral/Scoot Transfers: Min assist;Max assist General transfer comment: minA from bed to wheelchair in downhill transfer. MaxA from wheelchair to bed with significant height difference between wheelchair and mattress. PT unable to lower bed to similar height as wheelchair despite deflation of air mattress. PT assisting with elevation of patient. Pt lays back onto bed and PT assists with elevating hips and LEs back onto mattress surface.    Ambulation/Gait                   Theme park manager mobility: Yes Wheelchair propulsion: Both upper extremities Wheelchair parts: Supervision/cueing Distance: Estate manager/land agent Details (indicate cue type and reason): assist for armrest management  Modified Rankin (Stroke Patients Only)       Balance Overall balance assessment: Needs assistance Sitting-balance support: Single extremity supported;Bilateral upper extremity supported;Feet unsupported Sitting balance-Leahy  Scale: Poor Sitting balance - Comments: reliant  on UE support Postural control: Posterior lean                                  Cognition Arousal/Alertness: Awake/alert Behavior During Therapy: WFL for tasks assessed/performed Overall Cognitive Status: Within Functional Limits for tasks assessed                           Safety/Judgement: Decreased awareness of deficits              Exercises Other Exercises Other Exercises: Chair push ups with turns while hips are clear of surface x4 Other Exercises: educated pt to perform push ups off arm rests as HEP while up in chair    General Comments General comments (skin integrity, edema, etc.): VSS on RA. Pt reports his friend will not be able to assist him enough at home. Pt now realizing he needs to be much more independent to successfully return home      Pertinent Vitals/Pain Pain Assessment: Faces Faces Pain Scale: Hurts even more Pain Location: back and buttocks Pain Descriptors / Indicators: Grimacing Pain Intervention(s): Monitored during session    Home Living                          Prior Function            PT Goals (current goals can now be found in the care plan section) Acute Rehab PT Goals Patient Stated Goal: wants to return to independence PT Goal Formulation: With patient Time For Goal Achievement: 09/28/21 Potential to Achieve Goals: Good Progress towards PT goals: Progressing toward goals    Frequency    Min 3X/week      PT Plan Discharge plan needs to be updated    Co-evaluation              AM-PAC PT "6 Clicks" Mobility   Outcome Measure  Help needed turning from your back to your side while in a flat bed without using bedrails?: A Little Help needed moving from lying on your back to sitting on the side of a flat bed without using bedrails?: A Little Help needed moving to and from a bed to a chair (including a wheelchair)?: A Lot Help needed  standing up from a chair using your arms (e.g., wheelchair or bedside chair)?: Total Help needed to walk in hospital room?: Total Help needed climbing 3-5 steps with a railing? : Total 6 Click Score: 11    End of Session   Activity Tolerance: Patient tolerated treatment well Patient left: in bed;with call bell/phone within reach Nurse Communication: Mobility status PT Visit Diagnosis: Other abnormalities of gait and mobility (R26.89);Muscle weakness (generalized) (M62.81);Pain Pain - part of body:  (back/buttocks)     Time: 4081-4481 PT Time Calculation (min) (ACUTE ONLY): 46 min  Charges:  $Therapeutic Activity: 23-37 mins $Self Care/Home Management: 8-22 $Wheel Chair Management: 8-22 mins                     Zenaida Niece, PT, DPT Acute Rehabilitation Pager: [phone] Office [phone]    Zenaida Niece 09/17/2021, 5:06 PM

## 2021-09-17 NOTE — TOC Progression Note (Signed)
Transition of Care [facility]) - Progression Note    Patient Details  Name: [name] MRN: [id number] Date of Birth: [birth year]  [facility] CM/SW Contact  Sharin Mons, RN Phone Number: 09/17/2021, 3:34 PM  Clinical Narrative:    After long conversation @ bedside with pt and pt's friend Jennifer/caregiver) regarding a safe d/c plan pt now states open to SNF placement. Friend states she is unsure if she can manage his care and can't provide 24/7 supervision and assistance. Pt states will not go back to [facility] States preference a 4-5 star rated SNF . Patient understands he is currently unable to care for self independently given his current physical needs and fall risk.  NCM discussed insurance authorization process and provided Medicare SNF ratings list. Patient expressed being hopeful for a 4-5  star rated rehab and to feel better soon. No further questions reported at this time. NCM to continue to follow and assist with discharge planning needs.    Expected Discharge Plan: Skilled Nursing Facility Barriers to Discharge: SNF Pending bed offer  Expected Discharge Plan and Services Expected Discharge Plan: [place] In-house Referral: Clinical Social Work Discharge Planning Services: CM Consult Post Acute Care Choice: Durable Medical Equipment, Home Health Living arrangements for the past 2 months: Apartment Expected Discharge Date: 09/17/21               DME Arranged: Youth worker wheelchair with seat cushion DME Agency: [employer] Date DME Agency Contacted: 09/17/21 Time DME Agency Contacted: [phone] Representative spoke with at DME Agency: Freda Munro HH Arranged: RN, PT, OT, Nurse's Aide, Social Work [employer] Agency: [facility] [place] Date [place]: 09/15/21 Time [place]: 1659 Representative spoke with at [place]: [place] ([place]) Interventions    Readmission Risk Interventions No flowsheet  data found.

## 2021-09-17 NOTE — TOC Progression Note (Signed)
Transition of Care [facility]) - Progression Note    Patient Details  Name: [name] MRN: [id number] Date of Birth: 13-Oct-1965  Transition of Care [facility]) CM/SW Contact  Joanne Chars, LCSW Phone Number: 09/17/2021, 10:03 AM  Clinical Narrative:   CSW informed by MD that pt continuing to state he is not ready to go home.  CSW met with pt to discuss with pt HH vs SNF options for discharge.  Pt reports he is concerned, "I've only been in my wheelchair 3 times."  Discussed SNF options but pt is adamant that he is not going to return to SNF.  Discussed services available with HH.  Pt was not aware that he will also receive OT, discussed [place] aide option, social work option.  Pt confirms that he wants to DC home with max services through [facility].  Pt friend Anderson Malta will be the person who is helping change dressings on his wound.  She is coming around noon today for the training.  Pt aware that he will DC this afternoon.  States [employer] called and is delivering his wheelchair today.    Expected Discharge Plan: [place] Barriers to Discharge: Continued Medical Work up  Expected Discharge Plan and Services Expected Discharge Plan: [place] In-house Referral: Clinical Social Work Discharge Planning Services: CM Consult Post Acute Care Choice: Durable Medical Equipment, Home Health Living arrangements for the past 2 months: Apartment                 DME Arranged: Youth worker wheelchair with seat cushion DME Agency: [employer] Date DME Agency Contacted: 09/17/21 Time DME Agency Contacted: [phone] Representative spoke with at DME Agency: Freda Munro HH Arranged: RN, PT, OT, Nurse's Aide, Social Work [employer] Agency: [facility] [place] Date [place]: 09/15/21 Time [place]: 1659 Representative spoke with at [place]: [place] ([place]) Interventions    Readmission Risk Interventions No flowsheet data found.

## 2021-09-17 NOTE — Progress Notes (Signed)
Addendum  [name] had been discharged earlier this afternoon.  Following that, Ms. [name], PT advised that she would like [name] to stay another night so she could reevaluate and work with [name] tomorrow.  Also TOC feels that [name] is not safe to return home by himself with limited support from family/friends.  The current friend who plans to assist [name] with dressing changes reportedly does not live with [name] and will not be with [name] at all times.  [name] however has repeatedly declined SNF admission.  For now, will cancel discharge for today and have PT and TOC reassess in a.m.  Vernell Leep, MD,  FACP, [facility], [facility], [facility] (Care Management Physician Certified) [facility] [place]  To contact the attending provider between 7A-7P or the covering provider during after hours 7P-7A, please log into the web site [email or web address] and access using universal [place] password for that web site. If you do not have the password, please call the hospital operator.

## 2021-09-17 NOTE — Progress Notes (Signed)
Physical Therapy Treatment Patient Details Name: Mal Asher MRN: [id number] DOB: 1966/03/20 Today's Date: 09/17/2021   History of Present Illness Antolin Belsito is a 56 y.o. male  presented with complaints of worsening back pain related to thoracic discitis; with medical history significant of hypertension, CHF, DM type II, right BKA (has prosthesis) and L BKA secondary to diabetic foot infection in mid November; Prolonged hospitalization at that time, 75/06/2584-10/14/7822 with complications due to infection, thoracic discitis, and Sacral pressure wound (which was noted to be infected during previous hospitalization on 12/8)    PT Comments    Continuing work on functional mobility and activity tolerance;  Session focused on functional transfers, and pt performed bed to wheelchair, and then wheelchair to bed transfers with moderate assist and very close guard for safety; At this point, he will need very reliable at least mod assist for transfers; Would request another night inpt to work more on transfers, and provide more caregiver education;   He has very good rehab potential, and will benefit from post-acute rehab; REcommend SNF for consistent rehab to maximize independence and safety with mobility and ADLs prior to dc home  Recommendations for follow up therapy are one component of a multi-disciplinary discharge planning process, led by the attending physician.  Recommendations may be updated based on patient status, additional functional criteria and insurance authorization.  Follow Up Recommendations  Skilled nursing-short term rehab (<3 hours/day)     Assistance Recommended at Discharge Intermittent Supervision/Assistance  Patient can return home with the following A lot of help with walking and/or transfers   Equipment Recommendations  Wheelchair (measurements PT);Wheelchair cushion (measurements PT);Hospital bed;Other (comment);BSC/3in1    Recommendations for Other Services        Precautions / Restrictions Precautions Precautions: Fall;Back Precaution Booklet Issued: No Precaution Comments: Back precautions for comfort Other Brace: Prostheses are not in the room Restrictions LLE Weight Bearing: Non weight bearing Other Position/Activity Restrictions: NWB through distal L residual limb; BKA surgery is healing well; likely can bear weight through knee for knee plank and quadruped therex     Mobility  Bed Mobility Overal bed mobility: Needs Assistance Bed Mobility: Rolling;Sidelying to Sit Rolling: Supervision Sidelying to sit: Min guard       General bed mobility comments: Close guard for safety; incr time, tremulous at initial sitting up    Transfers Overall transfer level: Needs assistance   Transfers: Bed to chair/wheelchair/BSC            Lateral/Scoot Transfers: Mod assist General transfer comment: Mod assist to steady wheelchair and close guard for safety; Ineffiecint scoots/boosts, and taking incr time, but pt able to clear hips with concerted effort at chair push ups; unsteady during transfer, and needs close guard in front of him for safety; transferred bed to wheelchair, and tehn back to bed after performing some chair pushups    Ambulation/Gait                   Stairs             Wheelchair Mobility    Modified Rankin (Stroke Patients Only)       Balance     Sitting balance-Leahy Scale: Poor Sitting balance - Comments: Heavy dependence on Bil UE support for balance and propping                                    Cognition Arousal/Alertness: Awake/alert  Behavior During Therapy: WFL for tasks assessed/performed Overall Cognitive Status: No family/caregiver present to determine baseline cognitive functioning                           Safety/Judgement: Decreased awareness of deficits              Exercises Other Exercises Other Exercises: Chair push ups with turns while hips  are clear of surface x4 Other Exercises: educated pt to perform push ups off arm rests as HEP while up in chair    General Comments General comments (skin integrity, edema, etc.): Pt states he has reliable assist, however this PT is unsure that it will be around the clock; I believe he and his caregivers will benefit from more training and education -- while this could be addressed acutely in hospital, a rehab stay post-acutely would be the best situation, and give the pt more time to get more stable, and caregivers more time to learn and practice how to best assist pt      Pertinent Vitals/Pain Pain Assessment: Faces Faces Pain Scale: Hurts a little bit Pain Location: Back pain and sacral decub pain; increased pain with sitting upright in bed Pain Descriptors / Indicators: Grimacing;Sore Pain Intervention(s): Monitored during session;Premedicated before session    Home Living                          Prior Function            PT Goals (current goals can now be found in the care plan section) Acute Rehab PT Goals Patient Stated Goal: wants to go home PT Goal Formulation: With patient Time For Goal Achievement: 09/28/21 Potential to Achieve Goals: Good Progress towards PT goals: Progressing toward goals    Frequency    Min 2X/week (May need to amend frequency as DC plan unfolds)      PT Plan Discharge plan needs to be updated (Pt may be agreeable to SNF for rehab)    Co-evaluation              AM-PAC PT "6 Clicks" Mobility   Outcome Measure  Help needed turning from your back to your side while in a flat bed without using bedrails?: None Help needed moving from lying on your back to sitting on the side of a flat bed without using bedrails?: A Little Help needed moving to and from a bed to a chair (including a wheelchair)?: A Lot Help needed standing up from a chair using your arms (e.g., wheelchair or bedside chair)?: Total Help needed to walk in  hospital room?: Total Help needed climbing 3-5 steps with a railing? : Total 6 Click Score: 12    End of Session   Activity Tolerance: Patient tolerated treatment well Patient left: in bed;with call bell/phone within reach Nurse Communication: Mobility status PT Visit Diagnosis: Other abnormalities of gait and mobility (R26.89);Muscle weakness (generalized) (M62.81);Pain Pain - part of body:  (back)     Time: 3570-1779 PT Time Calculation (min) (ACUTE ONLY): 46 min  Charges:  $Therapeutic Activity: 38-52 mins $Self Care/Home Management: 8-22                     Roney Marion, [place] Pager [phone] Office [place] 09/17/2021, 3:59 PM

## 2021-09-17 NOTE — Discharge Summary (Addendum)
Physician Discharge Summary  [name] OFB:[id number] DOB: Mar 21, 1966  PCP: Patient, No Pcp Per (Inactive).  Patient now has a new PCP to follow-up with, set up by [facility] team.  Admitted from: Home Discharged to: Home.  Patient declines SNF.  Has been denied for CIR earlier.  Admit date: 09/12/2021 Discharge date: 09/17/2021  Recommendations for Outpatient Follow-up:    Follow-up Information     [facility] Follow up.   Specialty: [place] Why: the office will call to schedule home health visits Contact information: [address] [place] [address] [phone]         [facility] Follow up on 10/28/2021.   Why: Post hospital follow up scheduled for 10/28/2020 at 2:30 pm with Dr. Karle Plumber. Placed on waitlist for earlier appointment time.  To be seen with repeat labs (CBC & CMP). Contact information: [place] [address] [phone]        Eustace Moore, MD. Schedule an appointment as soon as possible for a visit in 2 week(s).   Specialty: Neurosurgery Contact information: [address] [place] [address] [phone]         [place]             . Schedule an appointment as soon as possible for a visit.   Contact information: [address] [place] [address] [phone]               Patient also has a appointment with infectious disease on 10/07/2021.  Home Health:  Home Health Orders (From admission, onward)     Start     Ordered   Unscheduled  [place]  At discharge       Question Answer Comment  To provide the following care/treatments PT   To provide the following care/treatments OT   To provide the following care/treatments RN   To provide the following care/treatments [place]   To provide the following care/treatments Social work       09/17/21 1225             Equipment/Devices:     [place]  (From admission, onward)           Start     Ordered   09/17/21 0857  For home use only DME lightweight manual wheelchair with seat cushion  Once       Comments: Patient suffers from  BKA, sacral decubitus ulcers which impairs their ability to perform daily activities like bathing in the home.  A walker will not resolve  issue with performing activities of daily living. A wheelchair will allow patient to safely perform daily activities. Patient is not able to propel themselves in the home using a standard weight wheelchair due to general weakness. Patient can self propel in the lightweight wheelchair. Length of need 12 months . Accessories: elevating leg rests (ELRs), wheel locks, extensions and anti-tippers.   09/17/21 0857             Discharge Condition: Improved and stable   Code Status: Prior Diet recommendation:  Discharge Diet Orders (From admission, onward)     Start     Ordered   09/17/21 0000  Diet - low sodium heart healthy        09/17/21 1225   09/17/21 0000  Diet Carb Modified  09/17/21 1225             Discharge Diagnoses:  Principal Problem:   Recent bacteremia due to group B Streptococcus, Streptococcus agalactiae, thoracic discitis/osteomyelitis with epidural abscess Active Problems:   Essential hypertension   Stage 3a chronic kidney disease (HCC)   Urinary hesitancy   Type 2 diabetes mellitus   S/P bilateral BKA (below knee amputation) (HCC)   Paroxysmal atrial flutter (HCC)   Sacral pressure injury of skin   Discitis of thoracic region   Back pain   Leukocytosis   Pseudohyponatremia   Normocytic anemia   Insomnia   Chronic systolic CHF (congestive heart failure) (HCC)   Hypogonadism in male   Chronic pain   Stage IV pressure ulcer of sacral region [facility])   Brief Summary: 56 year old male with history of HTN, chronic systolic CHF, CH8/IFOY, right  BKA due to diabetic foot infection, comes into the hospital with worsening back pain. He recently had a prolonged hospitalization 07/21/2021-09/09/2021 with left foot infection status post BKA 07/23/21, hospital course complicated by Group B strep bacteremia with discitis/osteomyelitis with epidural abscess, new diagnosis of systolic CHF, paroxysmal a flutter, sacral decubitus ulcers.  He went to [facility] and rehab and he reports that had a terrible experience with the nursing staff, his antibiotics were missed as well as pain medications.  Developed subjective fevers/sweats, worsening back pain and was sent back to the hospital.  He does not want to go back there.  On admission he was found to have an elevated CRP at 10.4, slight leukocytosis of 11.7.     Assessment & Plan: * Recent bacteremia due to group B Streptococcus, Streptococcus agalactiae, thoracic discitis/osteomyelitis with epidural abscess- (present on admission) -This was diagnosed at his prior hospitalization.  MRI done 1/7 on admission showed continued interval improvement in T6-7 discitis/osteomyelitis with decreased p.o. in the disc space and improved erosion of the T7 superior endplate.  There is also decreased posterior epidural phlegmon and persistent moderate canal stenosis.  -He was on vancomycin from admission 11/14 for 6 weeks through 12/26, currently on cefadroxil 1000 twice daily up until 10/01/2021 -ID consulted, appreciate input.  Continue oral antibiotics for now.  Dr. Drucilla Schmidt saw patient 1/9 -Patient has a follow-up appointment with Dr. Coral Else on 09/09/2021.     Sacral pressure injury of skin- (present on admission) -Noted to be infected during previous hospitalization on 12/8 and was treated with a total of 7 days of cefepime.  Wound care has been consulted during the last hospitalization and he was receiving Hydro therapy.  Hydrotherapy only to be continued while hospitalized as per prior [facility] MD discussion with  PT -Wound care and general surgery consulted, no need for surgical debridement as wound appears fairly good.  Continue local care per wound with twice daily saline dressing and collagenase.   -He plans to go home and have family/friend take care of the wound, he needs to designate someone who can come to the hospital to get teaching -As per report, patient's friend is coming in today to have wound care teaching done.  He also has procured some specialized equipment via [employer]. -Requested TOC to arrange outpatient wound care clinic follow-up as recommended by general surgery. -Patient refuses SNF admission. -Better control of DM.  Remains at high risk for worsening and recurrent hospitalization.   Pseudohyponatremia- (present on admission) - Due to high CBGs, stable   Chronic systolic CHF (congestive heart failure) ([place]) - He currently appears euvolemic.  Continue  metoprolol.  Not on diuretics PTA.   Normocytic anemia- (present on admission) - Due to chronic disease, no bleeding, continue to monitor.  Stable.   S/P bilateral BKA (below knee amputation) ([place]) -during prior hospital stay, wounds appear well-healed.   Essential hypertension- (present on admission) - Continue metoprolol   Paroxysmal atrial flutter ([place])- (present on admission) -He is seen normal sinus rhythm since DCCV 07/2021. Continue amiodarone and Eliquis.  Outpatient follow-up with cardiology.   Stage IV pressure ulcer of sacral region [facility])- (present on admission) - See discussion above between surgery and wound care   Chronic pain -Opioid dependent.  Continues to complain of pain.  Continue current dose of OxyContin and as needed oral Dilaudid that was being utilized in the hospital with close outpatient follow-up with PCP.  Also continue gabapentin for neuropathic pain.  May consider outpatient pain clinic referral.  Reviewed PDMP and last refill of oxycodone was in October 2022.   Hypogonadism in male -Continue  testosterone   Insomnia - Trazodone as needed   Leukocytosis- (present on admission) -Possibly related to recent missed doses of antibiotics. Recheck blood cultures, currently no growth to date.  Stable.   Discitis of thoracic region- (present on admission) - See above   Type 2 diabetes mellitus- (present on admission) -Continue insulin regimen as below, he is hyperglycemic.  Monitor CBGs and adjust insulin as needed.  CBGs mildly uncontrolled.  Continue Lantus and mealtime NovoLog at discharge.  Close outpatient follow-up with PCP.  A1c 13.8 on 07/22/2021 indicating poor outpatient control.  Since he has been in the hospital, improved control and may consider repeating A1c in a month or so.   CBG (last 3)       Recent Labs    09/15/21 1611 09/15/21 2148 09/16/21 0823  GLUCAP 115* 206* 186*      Urinary hesitancy- (present on admission) -During prior hospitalization patient had retention type symptoms requiring repeated in and out caths. Continue finasteride and tamsulosin   Stage 3a chronic kidney disease ([place])- (present on admission) -Baseline creatinine ranging 1.0-1.4, currently at baseline     Consultations: General surgery ID  Procedures: Hydrotherapy   Discharge Instructions  Discharge Instructions     Call MD for:  difficulty breathing, headache or visual disturbances   Complete by: As directed    Call MD for:  extreme fatigue   Complete by: As directed    Call MD for:  persistant dizziness or light-headedness   Complete by: As directed    Call MD for:  persistant nausea and vomiting   Complete by: As directed    Call MD for:  redness, tenderness, or signs of infection (pain, swelling, redness, odor or green/yellow discharge around incision site)   Complete by: As directed    Call MD for:  severe uncontrolled pain   Complete by: As directed    Call MD for:  temperature >100.4   Complete by: As directed    Diet - low sodium heart healthy   Complete by:  As directed    Diet Carb Modified   Complete by: As directed    Discharge wound care:   Complete by: As directed    Buttock wound dressing change recommendations: Continue twice daily wet to dry dressing changes. Add santyl with dressing changes.   Increase activity slowly   Complete by: As directed         Medication List     STOP taking these medications    Chlorhexidine Gluconate  Cloth 2 % Pads   HYDROcodone-acetaminophen 5-325 MG tablet Commonly known as: NORCO/VICODIN   insulin detemir 100 UNIT/ML injection Commonly known as: LEVEMIR   liver oil-zinc oxide 40 % ointment Commonly known as: DESITIN   magnesium hydroxide 400 MG/5ML suspension Commonly known as: MILK OF MAGNESIA   Methocarbamol 1000 MG Tabs   nystatin cream Commonly known as: MYCOSTATIN   Oxycodone HCl 10 MG Tabs Replaced by: oxyCODONE 40 mg 12 hr tablet   oxyCODONE-acetaminophen 5-325 MG tablet Commonly known as: PERCOCET/ROXICET   sodium hypochlorite 0.125 % Soln Commonly known as: DAKIN'S 1/4 STRENGTH       TAKE these medications    amiodarone 200 MG tablet Commonly known as: PACERONE Take 1 tablet (200 mg total) by mouth daily.   apixaban 5 MG Tabs tablet Commonly known as: ELIQUIS Take 1 tablet (5 mg total) by mouth 2 (two) times daily.   cefadroxil 500 MG capsule Commonly known as: DURICEF Take 2 capsules (1,000 mg total) by mouth 2 (two) times daily for 14 days. What changed:  when to take this additional instructions   collagenase ointment Commonly known as: SANTYL Apply topically daily. Apply to fibrin on sacral wound. What changed: additional instructions   Ensure Plus Liqd Take 237 mLs by mouth 2 (two) times daily between meals. What changed: Another medication with the same name was removed. Continue taking this medication, and follow the directions you see here.   finasteride 5 MG tablet Commonly known as: PROSCAR Take 1 tablet (5 mg total) by mouth daily.    gabapentin 400 MG capsule Commonly known as: NEURONTIN Take 1 capsule (400 mg total) by mouth 3 (three) times daily.   HYDROmorphone 4 MG tablet Commonly known as: DILAUDID Take 1 tablet (4 mg total) by mouth every 6 (six) hours as needed for severe pain.   insulin aspart 100 UNIT/ML injection Commonly known as: novoLOG Inject 6 Units into the skin 3 (three) times daily with meals.   Insulin Pen Needle 32G X 4 MM Misc Use as directed   Lantus SoloStar 100 UNIT/ML Solostar Pen Generic drug: insulin glargine Inject 36 Units into the skin 2 (two) times daily. What changed:  medication strength when to take this   metoprolol succinate 25 MG 24 hr tablet Commonly known as: TOPROL-XL Take 1 tablet (25 mg total) by mouth daily.   multivitamin with minerals Tabs tablet Take 1 tablet by mouth daily.   oxyCODONE 40 mg 12 hr tablet Commonly known as: OXYCONTIN Take 1 tablet (40 mg total) by mouth every 12 (twelve) hours. Replaces: Oxycodone HCl 10 MG Tabs   tamsulosin 0.4 MG Caps capsule Commonly known as: FLOMAX Take 1 capsule (0.4 mg total) by mouth daily.   testosterone 50 MG/5GM (1%) Gel Commonly known as: ANDROGEL Place 5 g onto the skin daily. What changed: Another medication with the same name was removed. Continue taking this medication, and follow the directions you see here.   traZODone 150 MG tablet Commonly known as: DESYREL Take 1 tablet (150 mg total) by mouth at bedtime as needed for sleep.       Allergies  Allergen Reactions   Bactrim [Sulfamethoxazole-Trimethoprim]    Ceprotin [Protein C Concentrate (Human)]    Ciprofloxacin Other (See Comments)    Kidney function   Levaquin [Levofloxacin]       Procedures/Studies: MR THORACIC SPINE W WO CONTRAST  Result Date: 09/13/2021 CLINICAL DATA:  Thoracic osteomyelitis discitis. EXAM: MRI THORACIC AND LUMBAR SPINE WITHOUT AND WITH  CONTRAST TECHNIQUE: Multiplanar and multiecho pulse sequences of the  thoracic and lumbar spine were obtained without and with intravenous contrast. CONTRAST:  35mL GADAVIST GADOBUTROL 1 MMOL/ML IV SOLN COMPARISON:  MRI thoracic spine dated August 22, 2021. MRI thoracic and lumbar spine dated August 07, 2021. FINDINGS: MRI THORACIC SPINE FINDINGS Alignment:  Physiologic. Vertebrae: Improved T6-T7 discitis osteomyelitis with decreased pus in the disc space. T7 superior endplate erosion has also improved. Intense vertebral body and posterior element marrow edema and enhancement from T5 through T8 is not significantly changed. Cord: Decreased posterior epidural phlegmon compared to prior study without significant residual rim enhancing fluid collection. Similar ventral epidural phlegmon from T5 through T7. Continued moderate spinal canal stenosis at T6-T7 due to epidural phlegmon. Paraspinal and other soft tissues: Small bilateral pleural effusions have decreased since the prior study. Patchy opacities in both lower lobes. Extensive paravertebral edema and enhancement from T5 through T9 is similar. Disc levels: No additional high-grade spinal canal or neuroforaminal stenosis. MRI LUMBAR SPINE FINDINGS Segmentation:  Standard. Alignment:  Physiologic. Vertebrae: No fracture, evidence of discitis, or bone lesion. Similar acute on chronic degenerative endplate marrow signal changes at L5-S1 with minimal endplate enhancement and trace fluid in the disc space. Conus medullaris: Extends to the L1-L2 level and appears normal. No intradural enhancement. Paraspinal and other soft tissues: No paravertebral inflammatory changes. Similar bilateral paraspinous muscle edema without enhancement. Unchanged mild bilateral hydronephrosis. Unchanged complex left renal cyst. Disc levels: T12-L1: Unchanged tiny left paracentral disc protrusion. No stenosis. L1-L2:  Unchanged mild disc bulging. No stenosis. L2-L3: Unchanged tiny left paracentral disc protrusion and annular fissure. No stenosis. L3-L4:   Negative. L4-L5: Unchanged small broad-based posterior disc protrusion and mild bilateral facet arthropathy. Unchanged mild bilateral neuroforaminal stenosis. No spinal canal stenosis. L5-S1: Unchanged severe disc height loss, small circumferential disc osteophyte complex, and superimposed small central disc protrusion. Unchanged mild to moderate bilateral facet arthropathy. Unchanged moderate right greater than left neuroforaminal stenosis. No spinal canal stenosis. IMPRESSION: Thoracic spine: 1. Continued interval improvement in T6-T7 discitis osteomyelitis with decreased pus in the disc space and improved erosion of the T7 superior endplate. Decreased posterior epidural phlegmon. Continued moderate spinal canal stenosis at T6-T7 due to epidural phlegmon. 2. Extensive marrow edema and enhancement involving the T5 through T8 vertebral bodies and posterior elements is not significantly changed and remains consistent with osteomyelitis. Extensive paravertebral edema and enhancement from T5 through T9 is also similar to prior study. 3. Small bilateral pleural effusions have decreased since the prior study. Patchy opacities in both lower lobes may be due to atelectasis or pneumonia. Lumbar spine: 1. No evidence of osteomyelitis-discitis. 2. Unchanged lumbar spondylosis as described above. Unchanged moderate right greater than left neuroforaminal stenosis at L5-S1. 3. Unchanged mild bilateral hydronephrosis. Electronically Signed   By: Titus Dubin M.D.   On: 09/13/2021 17:08   MR Lumbar Spine W Wo Contrast  Result Date: 09/13/2021 CLINICAL DATA:  Thoracic osteomyelitis discitis. EXAM: MRI THORACIC AND LUMBAR SPINE WITHOUT AND WITH CONTRAST TECHNIQUE: Multiplanar and multiecho pulse sequences of the thoracic and lumbar spine were obtained without and with intravenous contrast. CONTRAST:  81mL GADAVIST GADOBUTROL 1 MMOL/ML IV SOLN COMPARISON:  MRI thoracic spine dated August 22, 2021. MRI thoracic and lumbar  spine dated August 07, 2021. FINDINGS: MRI THORACIC SPINE FINDINGS Alignment:  Physiologic. Vertebrae: Improved T6-T7 discitis osteomyelitis with decreased pus in the disc space. T7 superior endplate erosion has also improved. Intense vertebral body and posterior element marrow edema and  enhancement from T5 through T8 is not significantly changed. Cord: Decreased posterior epidural phlegmon compared to prior study without significant residual rim enhancing fluid collection. Similar ventral epidural phlegmon from T5 through T7. Continued moderate spinal canal stenosis at T6-T7 due to epidural phlegmon. Paraspinal and other soft tissues: Small bilateral pleural effusions have decreased since the prior study. Patchy opacities in both lower lobes. Extensive paravertebral edema and enhancement from T5 through T9 is similar. Disc levels: No additional high-grade spinal canal or neuroforaminal stenosis. MRI LUMBAR SPINE FINDINGS Segmentation:  Standard. Alignment:  Physiologic. Vertebrae: No fracture, evidence of discitis, or bone lesion. Similar acute on chronic degenerative endplate marrow signal changes at L5-S1 with minimal endplate enhancement and trace fluid in the disc space. Conus medullaris: Extends to the L1-L2 level and appears normal. No intradural enhancement. Paraspinal and other soft tissues: No paravertebral inflammatory changes. Similar bilateral paraspinous muscle edema without enhancement. Unchanged mild bilateral hydronephrosis. Unchanged complex left renal cyst. Disc levels: T12-L1: Unchanged tiny left paracentral disc protrusion. No stenosis. L1-L2:  Unchanged mild disc bulging. No stenosis. L2-L3: Unchanged tiny left paracentral disc protrusion and annular fissure. No stenosis. L3-L4:  Negative. L4-L5: Unchanged small broad-based posterior disc protrusion and mild bilateral facet arthropathy. Unchanged mild bilateral neuroforaminal stenosis. No spinal canal stenosis. L5-S1: Unchanged severe disc  height loss, small circumferential disc osteophyte complex, and superimposed small central disc protrusion. Unchanged mild to moderate bilateral facet arthropathy. Unchanged moderate right greater than left neuroforaminal stenosis. No spinal canal stenosis. IMPRESSION: Thoracic spine: 1. Continued interval improvement in T6-T7 discitis osteomyelitis with decreased pus in the disc space and improved erosion of the T7 superior endplate. Decreased posterior epidural phlegmon. Continued moderate spinal canal stenosis at T6-T7 due to epidural phlegmon. 2. Extensive marrow edema and enhancement involving the T5 through T8 vertebral bodies and posterior elements is not significantly changed and remains consistent with osteomyelitis. Extensive paravertebral edema and enhancement from T5 through T9 is also similar to prior study. 3. Small bilateral pleural effusions have decreased since the prior study. Patchy opacities in both lower lobes may be due to atelectasis or pneumonia. Lumbar spine: 1. No evidence of osteomyelitis-discitis. 2. Unchanged lumbar spondylosis as described above. Unchanged moderate right greater than left neuroforaminal stenosis at L5-S1. 3. Unchanged mild bilateral hydronephrosis. Electronically Signed   By: Titus Dubin M.D.   On: 09/13/2021 17:08      Subjective: Seen this morning.  Ongoing pain issues but did not appear in discomfort.  States that he has supplies [place] from [place] and his friend is coming into the hospital for wound care education.  No new complaints reported.  Discharge Exam:  Vitals:   09/16/21 1430 09/16/21 2215 09/17/21 0445 09/17/21 0753  BP: 121/68 130/68 116/70 (!) 116/58  Pulse: 78 70 72 72  Resp: 17  19 16   Temp: 97.6 F (36.4 C) 97.7 F (36.5 C) 98 F (36.7 C) 97.6 F (36.4 C)  TempSrc: Oral Oral Oral Oral  SpO2: 98% 98% 99% 94%  Weight:        General: Pleasant young male, moderately built and overweight.  Pt lying comfortably in bed & appears in  no obvious distress. Cardiovascular: S1 & S2 heard, RRR, S1/S2 +. No murmurs, rubs, gallops or clicks. No JVD or pedal edema.  Telemetry personally reviewed: Sinus rhythm. Respiratory: Clear to auscultation without wheezing, rhonchi or crackles. No increased work of breathing. Abdominal:  Non distended, non tender & soft. No organomegaly or masses appreciated. Normal bowel sounds heard.  CNS: Alert and oriented. No focal deficits. Extremities: no edema, no cyanosis.  Bilateral BKA healed stumps without acute findings.  Right lower extremity with muscle wasting. Skin: Large sacral wound visualized in [facility] MD note 1/8 and PT hydrotherapy 1/9 and appears clean.  Did not personally examine the wound today   The results of significant diagnostics from this hospitalization (including imaging, microbiology, ancillary and laboratory) are listed below for reference.     Microbiology: Recent Results (from the past 240 hour(s))  Resp Panel by RT-PCR (Flu A&B, Covid) Nasopharyngeal Swab     Status: None   Collection Time: 09/09/21  3:53 PM   Specimen: Nasopharyngeal Swab; Nasopharyngeal(NP) swabs in vial transport medium  Result Value Ref Range Status   SARS Coronavirus 2 by RT PCR NEGATIVE NEGATIVE Final    Comment: (NOTE) SARS-CoV-2 target nucleic acids are NOT DETECTED.  The SARS-CoV-2 RNA is generally detectable in upper respiratory specimens during the acute phase of infection. The lowest concentration of SARS-CoV-2 viral copies this assay can detect is 138 copies/mL. A negative result does not preclude SARS-Cov-2 infection and should not be used as the sole basis for treatment or other patient management decisions. A negative result may occur with  improper specimen collection/handling, submission of specimen other than nasopharyngeal swab, presence of viral mutation(s) within the areas targeted by this assay, and inadequate number of viral copies(<138 copies/mL). A negative result must be  combined with clinical observations, patient history, and epidemiological information. The expected result is Negative.  Fact Sheet for Patients:  EntrepreneurPulse.com.au  Fact Sheet for Healthcare Providers:  IncredibleEmployment.be  This test is no t yet approved or cleared by the [place] FDA and  has been authorized for detection and/or diagnosis of SARS-CoV-2 by FDA under an Emergency Use Authorization (EUA). This EUA will remain  in effect (meaning this test can be used) for the duration of the COVID-19 declaration under Section 564(b)(1) of the Act, 21 U.S.C.section 360bbb-3(b)(1), unless the authorization is terminated  or revoked sooner.       Influenza A by PCR NEGATIVE NEGATIVE Final   Influenza B by PCR NEGATIVE NEGATIVE Final    Comment: (NOTE) The Xpert Xpress SARS-CoV-2/FLU/RSV plus assay is intended as an aid in the diagnosis of influenza from Nasopharyngeal swab specimens and should not be used as a sole basis for treatment. Nasal washings and aspirates are unacceptable for Xpert Xpress SARS-CoV-2/FLU/RSV testing.  Fact Sheet for Patients: EntrepreneurPulse.com.au  Fact Sheet for Healthcare Providers: IncredibleEmployment.be  This test is not yet approved or cleared by the [place] FDA and has been authorized for detection and/or diagnosis of SARS-CoV-2 by FDA under an Emergency Use Authorization (EUA). This EUA will remain in effect (meaning this test can be used) for the duration of the COVID-19 declaration under Section 564(b)(1) of the Act, 21 U.S.C. section 360bbb-3(b)(1), unless the authorization is terminated or revoked.  Performed at [facility], [place] [address], [place], [place] [address]   Resp Panel by RT-PCR (Flu A&B, Covid) Nasopharyngeal Swab     Status: None   Collection Time: 09/13/21 12:34 AM   Specimen: Nasopharyngeal Swab; Nasopharyngeal(NP) swabs  in vial transport medium  Result Value Ref Range Status   SARS Coronavirus 2 by RT PCR NEGATIVE NEGATIVE Final    Comment: (NOTE) SARS-CoV-2 target nucleic acids are NOT DETECTED.  The SARS-CoV-2 RNA is generally detectable in upper respiratory specimens during the acute phase of infection. The lowest concentration of SARS-CoV-2 viral copies this  assay can detect is 138 copies/mL. A negative result does not preclude SARS-Cov-2 infection and should not be used as the sole basis for treatment or other patient management decisions. A negative result may occur with  improper specimen collection/handling, submission of specimen other than nasopharyngeal swab, presence of viral mutation(s) within the areas targeted by this assay, and inadequate number of viral copies(<138 copies/mL). A negative result must be combined with clinical observations, patient history, and epidemiological information. The expected result is Negative.  Fact Sheet for Patients:  EntrepreneurPulse.com.au  Fact Sheet for Healthcare Providers:  IncredibleEmployment.be  This test is no t yet approved or cleared by the [place] FDA and  has been authorized for detection and/or diagnosis of SARS-CoV-2 by FDA under an Emergency Use Authorization (EUA). This EUA will remain  in effect (meaning this test can be used) for the duration of the COVID-19 declaration under Section 564(b)(1) of the Act, 21 U.S.C.section 360bbb-3(b)(1), unless the authorization is terminated  or revoked sooner.       Influenza A by PCR NEGATIVE NEGATIVE Final   Influenza B by PCR NEGATIVE NEGATIVE Final    Comment: (NOTE) The Xpert Xpress SARS-CoV-2/FLU/RSV plus assay is intended as an aid in the diagnosis of influenza from Nasopharyngeal swab specimens and should not be used as a sole basis for treatment. Nasal washings and aspirates are unacceptable for Xpert Xpress  SARS-CoV-2/FLU/RSV testing.  Fact Sheet for Patients: EntrepreneurPulse.com.au  Fact Sheet for Healthcare Providers: IncredibleEmployment.be  This test is not yet approved or cleared by the [place] FDA and has been authorized for detection and/or diagnosis of SARS-CoV-2 by FDA under an Emergency Use Authorization (EUA). This EUA will remain in effect (meaning this test can be used) for the duration of the COVID-19 declaration under Section 564(b)(1) of the Act, 21 U.S.C. section 360bbb-3(b)(1), unless the authorization is terminated or revoked.  Performed at [facility], [place] [address] [place], [place] [address]   Surgical pcr screen     Status: None   Collection Time: 09/13/21 12:28 PM   Specimen: Nasal Mucosa; Nasal Swab  Result Value Ref Range Status   MRSA, PCR NEGATIVE NEGATIVE Final   Staphylococcus aureus NEGATIVE NEGATIVE Final    Comment: (NOTE) The Xpert SA Assay (FDA approved for NASAL specimens in patients 45 years of age and older), is one component of a comprehensive surveillance program. It is not intended to diagnose infection nor to guide or monitor treatment. Performed at [facility], [place] [address] [place], [place] [address]   Culture, blood (routine x 2)     Status: None (Preliminary result)   Collection Time: 09/13/21 11:45 PM   Specimen: BLOOD RIGHT HAND  Result Value Ref Range Status   Specimen Description BLOOD RIGHT HAND  Final   Special Requests   Final    BOTTLES DRAWN AEROBIC AND ANAEROBIC Blood Culture adequate volume   Culture   Final    NO GROWTH 2 DAYS Performed at [facility], [place] [address], [place], [place] [address]    Report Status PENDING  Incomplete  Culture, blood (routine x 2)     Status: None (Preliminary result)   Collection Time: 09/13/21 11:45 PM   Specimen: BLOOD LEFT HAND  Result Value Ref Range Status   Specimen Description BLOOD LEFT HAND  Final   Special  Requests   Final    BOTTLES DRAWN AEROBIC AND ANAEROBIC Blood Culture adequate volume   Culture  Final    NO GROWTH 2 DAYS Performed at [facility], [place] [address], [place], [place] [address]    Report Status PENDING  Incomplete     Labs: CBC: Recent Labs  Lab 09/13/21 0034 09/14/21 0318 09/15/21 0317 09/16/21 0206 09/17/21 0309  WBC 11.7* 11.2* 10.0 11.1* 12.8*  NEUTROABS 10.1*  --   --   --   --   HGB 12.7* 11.6* 11.7* 11.1* 11.3*  HCT 38.4* 34.9* 37.1* 33.6* 35.1*  MCV 88.7 88.6 90.5 89.4 90.2  PLT 313 337 350 344 312    Basic Metabolic Panel: Recent Labs  Lab 09/13/21 0034 09/14/21 0318 09/15/21 0317 09/16/21 0206 09/17/21 0309  NA 130* 130* 134* 132* 134*  K 4.0 4.2 4.1 4.3 4.1  CL 97* 95* 97* 97* 100  CO2 22 23 30 28 27   GLUCOSE 264* 412* 224* 285* 151*  BUN 14 23* 19 25* 18  CREATININE 1.09 1.22 1.20 1.32* 1.16  CALCIUM 8.8* 8.4* 8.8* 8.3* 8.8*    Liver Function Tests: Recent Labs  Lab 09/16/21 0206  AST 25  ALT 54*  ALKPHOS 102  BILITOT 0.2*  PROT 5.9*  ALBUMIN 1.9*    CBG: Recent Labs  Lab 09/16/21 1620 09/16/21 2211 09/17/21 0454 09/17/21 0751 09/17/21 1133  GLUCAP 210* 231* 143* 334* 270*    Time coordinating discharge: 55 minutes  SIGNED:  Vernell Leep, MD,  FACP, [facility], [facility], [facility] (Care Management Physician Certified). [facility]  To contact the attending provider between 7A-7P or the covering provider during after hours 7P-7A, please log into the web site [email or web address] and access using universal [place] password for that web site. If you do not have the password, please call the hospital operator.

## 2021-09-18 ENCOUNTER — Encounter: Payer: Medicare HMO | Admitting: Orthopedic Surgery

## 2021-09-18 LAB — GLUCOSE, CAPILLARY
Glucose-Capillary: 353 mg/dL — ABNORMAL HIGH (ref 70–99)
Glucose-Capillary: 294 mg/dL — ABNORMAL HIGH (ref 70–99)
Glucose-Capillary: 214 mg/dL — ABNORMAL HIGH (ref 70–99)
Glucose-Capillary: 199 mg/dL — ABNORMAL HIGH (ref 70–99)

## 2021-09-18 LAB — RESP PANEL BY RT-PCR (FLU A&B, COVID) ARPGX2
Influenza A by PCR: NEGATIVE
Influenza B by PCR: NEGATIVE
SARS Coronavirus 2 by RT PCR: NEGATIVE

## 2021-09-18 NOTE — Progress Notes (Signed)
[name], male   DOB: [birth year], 56 y.o.   MRN: [id number] The bilateral transtibial amputation incisions are well-healed there is atrophy of the right lower extremity.  There is good consolidation of the residual limb.  Patient will need to resume using his stump shrinker's.

## 2021-09-18 NOTE — Care Management Important Message (Signed)
Important Message  Patient Details  Name: Rhyder Koegel MRN: [id number] Date of Birth: Jan 07, 1966   Medicare Important Message Given:  Yes     Hannah Beat 09/18/2021, 11:55 AM

## 2021-09-18 NOTE — Progress Notes (Signed)
Occupational Therapy Treatment Patient Details Name: [name] MRN: [id number] DOB: May 26, 1966 Today's Date: 09/18/2021   History of present illness [name] is a 56 y.o. male  presented with complaints of worsening back pain related to thoracic discitis; with medical history significant of hypertension, CHF, DM type II, right BKA (has prosthesis) and L BKA secondary to diabetic foot infection in mid November; Prolonged hospitalization at that time, 14/48/1856-11/05/4968 with complications due to infection, thoracic discitis, and Sacral pressure wound (which was noted to be infected during previous hospitalization on 12/8)   OT comments  Pt seen in conjunction with PT.  Pt adamant that he will only go to a 5 star SNF or he will return home.  Worked on lateral scoot transfers, w/c set up for transfers and chair push ups to improve UB strengthening for ADLs and transfrers.   He is making excellent progress and is able to perform transfers with min - mod A, and UB ADLs with set up assist.   IF he goes home, recommend hospital bed and drop arm commode and max out [facility] services.  Will continue to follow.    Recommendations for follow up therapy are one component of a multi-disciplinary discharge planning process, led by the attending physician.  Recommendations may be updated based on patient status, additional functional criteria and insurance authorization.    Follow Up Recommendations  Skilled nursing-short term rehab (<3 hours/day)    Assistance Recommended at Discharge Set up Supervision/Assistance  Patient can return home with the following  A lot of help with walking and/or transfers;A lot of help with bathing/dressing/bathroom;Help with stairs or ramp for entrance;Assistance with cooking/housework   Equipment Recommendations  BSC/3in1;Hospital bed (drop arm commode)    Recommendations for Other Services      Precautions / Restrictions Precautions Precautions: Fall Precaution  Comments: Consider following back precautions if pt is experiencing a lot of back pain Other Brace: Prostheses are not in the room Restrictions Weight Bearing Restrictions: No Other Position/Activity Restrictions: NWB through distal L residual limb; BKA surgery is healing well; likely can bear weight through knee for knee plank and quadruped therex       Mobility Bed Mobility Overal bed mobility: Needs Assistance Bed Mobility: Supine to Sit;Sit to Supine;Rolling Rolling: Min guard Sidelying to sit: Min guard   Sit to supine: Min guard   General bed mobility comments: Incr time to push up sidelying ot sit; Tremulous at inital sit, and heavily depending on bil UE support/propping; Able to ease self down form sit to sidelying, close guard for safety due to possibility of slipping as air mattress filled    Transfers Overall transfer level: Needs assistance Equipment used: None (Demonstrated sliding board use (pt declined to try this session)) Transfers: Bed to chair/wheelchair/BSC            Lateral/Scoot Transfers: Min assist;Mod assist General transfer comment: Close watch and cues for transfer setup, wheelchair position and safety; Overall light Mod assist to steady wheelchair and close guard for safety; Ineffiecint scoots/boosts, and taking incr time, but pt able to clear hips with concerted effort at chair push ups; unsteady during transfer, and needs close guard in front of him for safety; transferred bed to wheelchair, and then back to bed after performing some chair pushups and participating in wheelchair parts/management education and practice     Balance Overall balance assessment: Needs assistance Sitting-balance support: Bilateral upper extremity supported;Single extremity supported;No upper extremity supported Sitting balance-Leahy Scale: Poor (Approaching Fair) Sitting balance -  Comments: Heavily reliant on bil UE support initially; progressed to being able to weight  shift and lean forward and sideways; worked on adequate balance to allow for maneuvering wheelchair to position for transfer while sitting EOB, requiring pt to lean outside BOS; at this point, needs assistance for brake management -- will consider brake extensions                                   ADL either performed or assessed with clinical judgement   ADL Overall ADL's : Needs assistance/impaired                         Toilet Transfer: Moderate assistance;Requires drop arm (lateral scoot transfer)                  Extremity/Trunk Assessment Upper Extremity Assessment Upper Extremity Assessment: Overall WFL for tasks assessed   Lower Extremity Assessment Lower Extremity Assessment: Defer to PT evaluation        Vision       Perception     Praxis      Cognition Arousal/Alertness: Awake/alert Behavior During Therapy: WFL for tasks assessed/performed Overall Cognitive Status: Within Functional Limits for tasks assessed                     Current Attention Level:  (Internally distracted by stress, anxiety, and considerations for DC planning)           General Comments: Overall participated well this session, with occasionally changing the subject to his experience at previous SNF, and worry re: potential to repeat similar experience at the next SNF; gentle redirection back to task at hand worked well today          Exercises Exercises: Other exercises Other Exercises Other Exercises: Chair push ups with turns while hips are clear of surface x8   Shoulder Instructions       General Comments Pt instructed in use of sliding board but declined its use due to fear of not being able to remove it once he is seated in w/c.    Pertinent Vitals/ Pain       Pain Assessment: Faces Faces Pain Scale: Hurts a little bit Breathing: normal Negative Vocalization: none Facial Expression: smiling or inexpressive Body Language:  relaxed Consolability: no need to console PAINAD Score: 0 Facial Expression: Relaxed, neutral Body Movements: Absence of movements Muscle Tension: Relaxed Pain Location: back and buttocks Pain Descriptors / Indicators:  (pt mentioned pain meds during session, but no overt grimace related to pain) Pain Intervention(s): Monitored during session  Home Living                                          Prior Functioning/Environment              Frequency  Min 3X/week        Progress Toward Goals  OT Goals(current goals can now be found in the care plan section)  Progress towards OT goals: Progressing toward goals     Plan Frequency remains appropriate;Discharge plan needs to be updated    Co-evaluation    PT/OT/SLP Co-Evaluation/Treatment: Yes Reason for Co-Treatment: For patient/therapist safety;To address functional/ADL transfers PT goals addressed during session: Mobility/safety with mobility;Strengthening/ROM;Proper use of DME;Balance OT goals  addressed during session: ADL's and self-care      AM-PAC OT "6 Clicks" Daily Activity     Outcome Measure   Help from another person eating meals?: None Help from another person taking care of personal grooming?: None Help from another person toileting, which includes using toliet, bedpan, or urinal?: A Lot Help from another person bathing (including washing, rinsing, drying)?: A Little Help from another person to put on and taking off regular upper body clothing?: A Little Help from another person to put on and taking off regular lower body clothing?: A Lot 6 Click Score: 18    End of Session    OT Visit Diagnosis: Pain;Muscle weakness (generalized) (M62.81) Pain - part of body: Hip   Activity Tolerance Patient tolerated treatment well   Patient Left in bed;with call bell/phone within reach   Nurse Communication Mobility status        Time: 1129-1212 OT Time Calculation (min): 43  min  Charges: OT General Charges $OT Visit: 1 Visit OT Treatments $Self Care/Home Management : 8-22 mins  [name]., OTR/L Acute Rehabilitation Services Pager [phone] Office [phone]   [name] M 09/18/2021, 2:55 PM

## 2021-09-18 NOTE — Progress Notes (Addendum)
Progress Notes  Patient has been optimized for discharge since 09/17/2021 and awaiting appropriate disposition.  As per review with TOC team, not felt to be safe for discharge home with limited family/friend support even with maximized home health services.  Patient subsequently agreed to SNF but today indicates that he will only go if the SNF has a 5 star rating.  If he cannot get to one of those facilities, then he still intends to go home being well aware of our reservations regarding same.  No specific complaint voiced today except pertaining to his discharge disposition.  He expressed that he feels like he is being pushed out.  I explained to him that is not the case.  I advised him that he has achieved maximum benefit of acute hospital admission and is now medically optimized for discharge.  The next step is rehab which has to be pursued in a different setting other than the acute care hospital.  Appears comfortable and in no obvious distress. Stable vital signs. Alert and oriented.  No focal neurological deficits. Dr. Sharol Given has evaluated and agrees that his bilateral transtibial amputation incisions have healed and there is atrophy of the right lower extremity.  No new labs to review.  Assessment and plan:  As per my detailed discharge summary from 09/17/2020.  Patient remains medically optimized for discharge.  TOC updated that there is a SNF and [facility] with a 5 star rating who are reviewing patient's case.  Patient is willing to go there if accepted and if not then plans to go home.  PT and OT have reassessed day.  PT have also performed hydrotherapy while he is here.  He reportedly did not complain of pain throughout the session.  Vernell Leep, MD,  FACP, [facility], [facility], [facility] (Care Management Physician Certified) [facility] [place]  To contact the attending provider between 7A-7P or the covering provider during after hours 7P-7A, please log into the  web site [email or web address] and access using universal [place] password for that web site. If you do not have the password, please call the hospital operator.

## 2021-09-18 NOTE — Progress Notes (Signed)
Physical Therapy Treatment Patient Details Name: [name] MRN: [id number] DOB: 21-Jul-1966 Today's Date: 09/18/2021   History of Present Illness [name] is a 56 y.o. male  presented with complaints of worsening back pain related to thoracic discitis; with medical history significant of hypertension, CHF, DM type II, right BKA (has prosthesis) and L BKA secondary to diabetic foot infection in mid November; Prolonged hospitalization at that time, 91/63/8466-01/14/9356 with complications due to infection, thoracic discitis, and Sacral pressure wound (which was noted to be infected during previous hospitalization on 12/8)    PT Comments    Continuing work on functional mobility and activity tolerance;  Session focused on functional transfers, with pt showing improving endurance and ability to participate; Able to prep for and execute transfers with less dependence on PT/OT for physical assist; Still needing assist for safety, wheelchair steady, wheelchair parts management, and overall dynamic balance for seated and boosted movement in multiple planes for ADL and mobility execution; Continues to need education and reinforcement of wheelchair parts and management, as well as movement, therex for trunk strengthening, and LE stretching and strengthening in prep for prostheses;   Pt shows good rehab potential, and highly recommend post-acute rehab to maximize independence and safety with mobility and ADLs; He agrees to rehab at [facility], and PT/OT are in agreement with this plan  Recommendations for follow up therapy are one component of a multi-disciplinary discharge planning process, led by the attending physician.  Recommendations may be updated based on patient status, additional functional criteria and insurance authorization.  Follow Up Recommendations  Skilled nursing-short term rehab (<3 hours/day)     Assistance Recommended at Discharge Intermittent Supervision/Assistance  Patient can return  home with the following A lot of help with walking and/or transfers   Equipment Recommendations  Wheelchair (measurements PT);Wheelchair cushion (measurements PT);Hospital bed;Other (comment);BSC/3in1 (drop-arm)    Recommendations for Other Services       Precautions / Restrictions Precautions Precautions: Fall Precaution Comments: Consider following back precautions if pt is experiencing a lot of back pain Other Brace: Prostheses are not in the room Restrictions Weight Bearing Restrictions: No Other Position/Activity Restrictions: NWB through distal L residual limb; BKA surgery is healing well; likely can bear weight through knee for knee plank and quadruped therex     Mobility  Bed Mobility Overal bed mobility: Needs Assistance Bed Mobility: Supine to Sit;Sit to Supine;Rolling Rolling: Min guard Sidelying to sit: Min guard   Sit to supine: Min guard   General bed mobility comments: Incr time to push up sidelying ot sit; Tremulous at inital sit, and heavily depending on bil UE support/propping; Able to ease self down form sit to sidelying, close guard for safety due to possibility of slipping as air mattress filled    Transfers Overall transfer level: Needs assistance Equipment used: None (Demonstrated sliding board use (pt declined to try this session)) Transfers: Bed to chair/wheelchair/BSC            Lateral/Scoot Transfers: Min assist;Mod assist General transfer comment: Close watch and cues for transfer setup, wheelchair position and safety; Overall light Mod assist to steady wheelchair and close guard for safety; Ineffiecint scoots/boosts, and taking incr time, but pt able to clear hips with concerted effort at chair push ups; unsteady during transfer, and needs close guard in front of him for safety; transferred bed to wheelchair, and then back to bed after performing some chair pushups and participating in wheelchair parts/management education and practice     Ambulation/Gait  Stairs             Information systems manager mobility: Yes Wheelchair propulsion: Both upper extremities Wheelchair parts: Needs assistance Distance:  (short distance in room, including complicated turns and positioning for transfer prep) Wheelchair Assistance Details (indicate cue type and reason): Focused education re: brakes, armrests, and legrest management; Pt performed parts management after demonstration, needing up to mod cues (in particular for legrest management)  Modified Rankin (Stroke Patients Only)       Balance Overall balance assessment: Needs assistance Sitting-balance support: Bilateral upper extremity supported;Single extremity supported;No upper extremity supported Sitting balance-Leahy Scale: Poor (Approaching Fair) Sitting balance - Comments: Heavily reliant on bil UE support initially; progressed to being able to weight shift and lean forward and sideways; worked on adequate balance to allow for maneuvering wheelchair to position for transfer while sitting EOB, requiring pt to lean outside BOS; at this point, needs assistance for brake management -- will consider brake extensions                                    Cognition Arousal/Alertness: Awake/alert Behavior During Therapy: WFL for tasks assessed/performed Overall Cognitive Status: Within Functional Limits for tasks assessed                     Current Attention Level:  (Internally distracted by stress, anxiety, and considerations for DC planning)           General Comments: Overall participated well this session, with occasionally changing the subject to his experience at previous SNF, and worry re: potential to repeat similar experience at the next SNF; gentle redirection back to task at hand worked well today        Exercises Other Exercises Other Exercises: Chair push ups with turns while hips  are clear of surface x8    General Comments General comments (skin integrity, edema, etc.): Pt agreeable to pursue placement at SNF in [facility]      Pertinent Vitals/Pain Pain Assessment: Faces Faces Pain Scale: Hurts a little bit Pain Location: back and buttocks Pain Descriptors / Indicators:  (pt mentioned pain meds during session, but no overt grimace related to pain) Pain Intervention(s): Monitored during session    Home Living                          Prior Function            PT Goals (current goals can now be found in the care plan section) Acute Rehab PT Goals Patient Stated Goal: wants to return to independence PT Goal Formulation: With patient Time For Goal Achievement: 09/28/21 Potential to Achieve Goals: Good Progress towards PT goals: Progressing toward goals    Frequency    Min 3X/week      PT Plan Other (comment);Current plan remains appropriate (Must also consider the reality that he may refuse SNF at 11th hour)    Co-evaluation PT/OT/SLP Co-Evaluation/Treatment: Yes Reason for Co-Treatment: Necessary to address cognition/behavior during functional activity;For patient/therapist safety;To address functional/ADL transfers;Other (comment) (for concerted, multidisciplinary problem-solving for dc plan) PT goals addressed during session: Mobility/safety with mobility;Strengthening/ROM;Proper use of DME;Balance        AM-PAC PT "6 Clicks" Mobility   Outcome Measure  Help needed turning from your back to your side while in a flat bed without using bedrails?: A Little Help needed  moving from lying on your back to sitting on the side of a flat bed without using bedrails?: A Little Help needed moving to and from a bed to a chair (including a wheelchair)?: A Lot Help needed standing up from a chair using your arms (e.g., wheelchair or bedside chair)?: Total Help needed to walk in hospital room?: Total Help needed climbing 3-5 steps with a  railing? : Total 6 Click Score: 11    End of Session   Activity Tolerance: Patient tolerated treatment well Patient left: in bed;with call bell/phone within reach Nurse Communication: Mobility status PT Visit Diagnosis: Other abnormalities of gait and mobility (R26.89);Muscle weakness (generalized) (M62.81);Pain Pain - part of body:  (Back/buttocks)     Time: 1610-9604 PT Time Calculation (min) (ACUTE ONLY): 51 min  Charges:  $Therapeutic Exercise: 8-22 mins $Therapeutic Activity: 8-22 mins                     Roney Marion, PT  Acute Rehabilitation Services Pager [phone] Office [phone]    Colletta [place] 09/18/2021, 1:10 PM

## 2021-09-18 NOTE — Progress Notes (Signed)
Physical Therapy Wound Treatment Patient Details  Name: [name] MRN: [id number] Date of Birth: 1966/02/18  Today's Date: 09/18/2021 Time: 6213-0865 Time Calculation (min): 45 min  Subjective  Subjective Assessment Subjective: Pt pleasant and agreeable to hydrotherapy Patient and Family Stated Goals: Heal wound Date of Onset:  (PTA) Prior Treatments: Dressing changes, prior hydrotherapy  Pain Score:  Pt did not complain of pain throughout session.   Wound Assessment  Pressure Injury 07/27/21 Sacrum Mid Unstageable - Full thickness tissue loss in which the base of the injury is covered by slough (yellow, tan, gray, green or brown) and/or eschar (tan, brown or black) in the wound bed. small pink pressure ulcer with bre (Active)  Dressing Type Barrier Film (skin prep);Foam - Lift dressing to assess site every shift;Gauze (Comment);Moist to moist;Santyl 09/18/21 1439  Dressing Changed;Clean;Dry;Intact 09/18/21 1439  Dressing Change Frequency Daily 09/18/21 1439  State of Healing Early/partial granulation 09/18/21 1439  Site / Wound Assessment Red;Yellow 09/18/21 1439  % Wound base Red or Granulating 80% 09/18/21 1439  % Wound base Yellow/Fibrinous Exudate 20% 09/18/21 1439  % Wound base Black/Eschar 0% 09/18/21 1439  % Wound base Other/Granulation Tissue (Comment) 0% 09/18/21 1439  Peri-wound Assessment Intact;Maceration;Pink 09/18/21 1439  Wound Length (cm) 6.5 cm 09/15/21 1137  Wound Width (cm) 6.5 cm 09/15/21 1137  Wound Depth (cm) 3 cm 09/15/21 1137  Wound Surface Area (cm^2) 42.25 cm^2 09/15/21 1137  Wound Volume (cm^3) 126.75 cm^3 09/15/21 1137  Tunneling (cm) 0 09/15/21 1300  Undermining (cm) 3.3 cm 12:00 09/15/21 1137  Margins Unattached edges (unapproximated) 09/18/21 1439  Drainage Amount Moderate 09/18/21 1439  Drainage Description Serosanguineous 09/18/21 1439  Treatment Debridement (Selective);Hydrotherapy (Pulse lavage);Packing (Saline gauze) 09/18/21 1439       Hydrotherapy Pulsed lavage therapy - wound location: Sacrum Pulsed Lavage with Suction (psi): 8 psi Pulsed Lavage with Suction - Normal Saline Used: 1000 mL Pulsed Lavage Tip: Tip with splash shield Selective Debridement Selective Debridement - Location: Sacrum Selective Debridement - Tools Used: Forceps, Scalpel Selective Debridement - Tissue Removed: yellow necrotic tissue and slough    Wound Assessment and Plan  Wound Therapy - Assess/Plan/Recommendations Wound Therapy - Clinical Statement: Progressing with debridement. Wound bed appears significantly improved this session. Feel pt is close to d/c from hydrotherapy and may be a candidate for a wound vac. Will reach out to [facility] team to inquire. Wound Therapy - Functional Problem List: Global weakness Factors Delaying/Impairing Wound Healing: Diabetes Mellitus, Incontinence, Infection - systemic/local, Immobility, Multiple medical problems Hydrotherapy Plan: Debridement, Dressing change, Patient/family education, Pulsatile lavage with suction Wound Therapy - Frequency: 6X / week Wound Therapy - Current Recommendations: PT Wound Therapy - Follow Up Recommendations: dressing changes by family/patient  Wound Therapy Goals- Improve the function of patient's integumentary system by progressing the wound(s) through the phases of wound healing (inflammation - proliferation - remodeling) by: Wound Therapy Goals - Improve the function of patient's integumentary system by progressing the wound(s) through the phases of wound healing by: Decrease Necrotic Tissue to: 20% Decrease Necrotic Tissue - Progress: Progressing toward goal Increase Granulation Tissue to: 80% Increase Granulation Tissue - Progress: Progressing toward goal Goals/treatment plan/discharge plan were made with and agreed upon by patient/family: Yes Time For Goal Achievement: 7 days Wound Therapy - Potential for Goals: Good  Goals will be updated until maximal potential  achieved or discharge criteria met.  Discharge criteria: when goals achieved, discharge from hospital, MD decision/surgical intervention, no progress towards goals, refusal/missing three consecutive treatments without notification  or medical reason.  GP     Charges PT Wound Care Charges $Wound Debridement up to 20 cm: < or equal to 20 cm $PT Hydrotherapy Dressing: 2 dressings $PT PLS Gun and Tip: 1 Supply $PT Hydrotherapy Visit: 1 Visit       [name] 09/18/2021, 2:59 PM  Rolinda Roan, PT, DPT Acute Rehabilitation Services Pager: [phone] Office: [phone]

## 2021-09-18 NOTE — Progress Notes (Signed)
Wound changed

## 2021-09-18 NOTE — TOC Progression Note (Addendum)
Transition of Care [facility]) - Progression Note    Patient Details  Name: [name] MRN: [id number] Date of Birth: [birth year]  [facility]) CM/SW Contact  Joanne Chars, [place] Phone Number: 09/18/2021, 10:34 AM  Clinical Narrative:   CSW spoke with following SNF facilities regarding potential bed offers:  [place]: full per [employer]: full per [facility] Meager: reviewing [place]: does make bed offer [place]: [facility] does not think she has beds, will check but if no response, she does not have any beds available.  Pt has additional offer from [facility].  1100: CSW presented bed offers to pt.  He looked at [place] reviews for [place] and declined.  States he will accept placement at 5 star facility only or else he will go home.  He is also saying he needs to be assured that [facility] will be out within 24 hours.  CSW attempted to have a conversation about reasonable expectations for services.  Referrals also sent to [place], [facility], [place] reached out to admission contact people and asked if they can review and respond.   1245:  [facility] full per [facility]. [facility]: no beds, per [facility]:   5284: CSW reached out to [facility] again, [place] waiting on her corporate office as they have to sign off on pts under the age of 42.  Will try to get answer shortly.  CSW spoke with pt, provided update on full SNFs, discussed still waiting on [place].  Pt pulled up [facility] on his phone, ready reviews, said this SNF would be acceptable if they do make bed offer and if not, he will DC home.    1620: [place].  They cannot offer a bed.     Expected Discharge Plan: Skilled Nursing Facility Barriers to Discharge: SNF Pending bed offer  Expected Discharge Plan and Services Expected Discharge Plan: [place] In-house Referral: Clinical Social Work Discharge Planning Services: CM Consult Post Acute Care Choice:  Durable Medical Equipment, Home Health Living arrangements for the past 2 months: Apartment Expected Discharge Date: 09/17/21               DME Arranged: Youth worker wheelchair with seat cushion DME Agency: [employer] Date DME Agency Contacted: 09/17/21 Time DME Agency Contacted: [phone] Representative spoke with at DME Agency: Freda Munro HH Arranged: RN, PT, OT, Nurse's Aide, Social Work [employer] Agency: [facility] Date [place]: 09/15/21 Time [place]: 1659 Representative spoke with at [place]: [place] ([place]) Interventions    Readmission Risk Interventions No flowsheet data found.

## 2021-09-18 NOTE — Plan of Care (Signed)

## 2021-09-19 ENCOUNTER — Ambulatory Visit (HOSPITAL_COMMUNITY)
Admission: RE | Admit: 2021-09-19 | Discharge: 2021-09-19 | Disposition: A | Discharge status: Home / Self Care | Payer: Medicare HMO | Source: Ambulatory Visit | Attending: Internal Medicine | Admitting: Internal Medicine

## 2021-09-19 LAB — CULTURE, BLOOD (ROUTINE X 2)
Culture: NO GROWTH
Culture: NO GROWTH
Special Requests: ADEQUATE
Special Requests: ADEQUATE

## 2021-09-19 LAB — GLUCOSE, CAPILLARY
Glucose-Capillary: 265 mg/dL — ABNORMAL HIGH (ref 70–99)
Glucose-Capillary: 296 mg/dL — ABNORMAL HIGH (ref 70–99)
Glucose-Capillary: 171 mg/dL — ABNORMAL HIGH (ref 70–99)

## 2021-09-19 NOTE — TOC Transition Note (Signed)
Transition of Care [facility]) - CM/SW Discharge Note   Patient Details  Name: [name] MRN: [id number] Date of Birth: 04/13/1966  Transition of Care [facility]) CM/SW Contact:  Joanne Chars, LCSW Phone Number: 09/19/2021, 11:25 AM   Clinical Narrative:   Pt discharging home with [facility].  Wheelchair provided by [employer].  PCP appt set up with [facility], appt on Monday, 1/16.  Pt declined SNF after being unwilling to accept available facilities.  Pt friend Anderson [place] will provide transportation home today at 5pm.    Final next level of care: [place] Barriers to Discharge: Barriers Resolved   Patient Goals and CMS Choice Patient states their goals for this hospitalization and ongoing recovery are:: go home with home health services when ready CMS Medicare.gov Compare Post Acute Care list provided to:: Patient Choice offered to / list presented to : Patient  Discharge Placement                       Discharge Plan and Services In-house Referral: Clinical Social Work Discharge Planning Services: CM Consult Post Acute Care Choice: Durable Medical Equipment, Home Health          DME Arranged: Youth worker wheelchair with seat cushion DME Agency: [employer] Date DME Agency Contacted: 09/17/21 Time DME Agency Contacted: [phone] Representative spoke with at DME Agency: Freda Munro HH Arranged: RN, PT, OT, Nurse's Aide, Social Work [facility] Agency: [facility] [place] Date [place]: 09/15/21 Time [place]: 1659 Representative spoke with at [place]: [place] ([place]) Interventions     Readmission Risk Interventions No flowsheet data found.

## 2021-09-19 NOTE — Consult Note (Signed)
° °  [facility] CM Inpatient Consult   09/19/2021  Nymir Ringler 01-21-1966 [id number]  Follow up:  Post hospital  Patient was given a reminder card and 24 hour nurse advise line magnet as the inpatient TOC updated that patient has a follow up appointment  with the [facility], a [name] provider. Patient was working with therapist.  Natividad Brood, RN BSN [place] [facility] Liaison  [phone] business mobile phone Toll free office [phone]  Fax number: [phone] [email or web address]

## 2021-09-19 NOTE — Progress Notes (Signed)
Physical Therapy Wound Treatment Patient Details  Name: [name] MRN: [id number] Date of Birth: [birth year]  Today's Date: 09/19/2021 Time: 2878-6767 Time Calculation (min): 45 min  Subjective  Subjective Assessment Subjective: Pt pleasant and agreeable to hydrotherapy Patient and Family Stated Goals: Heal wound Date of Onset:  (PTA) Prior Treatments: Dressing changes, prior hydrotherapy  Pain Score:  Pt did not complain of pain throughout session. Premedicated.   Wound Assessment  Pressure Injury 07/27/21 Sacrum Mid Unstageable - Full thickness tissue loss in which the base of the injury is covered by slough (yellow, tan, gray, green or brown) and/or eschar (tan, brown or black) in the wound bed. small pink pressure ulcer with bre (Active)  Dressing Type Barrier Film (skin prep);Foam - Lift dressing to assess site every shift;Gauze (Comment);Moist to moist;Santyl 09/19/21 1517  Dressing Changed;Clean;Dry;Intact 09/19/21 1517  Dressing Change Frequency Daily 09/19/21 1517  State of Healing Early/partial granulation 09/19/21 1517  Site / Wound Assessment Red;Yellow 09/19/21 1517  % Wound base Red or Granulating 85% 09/19/21 1517  % Wound base Yellow/Fibrinous Exudate 15% 09/19/21 1517  % Wound base Black/Eschar 0% 09/19/21 1517  % Wound base Other/Granulation Tissue (Comment) 0% 09/19/21 1517  Peri-wound Assessment Intact 09/19/21 1517  Wound Length (cm) 6.5 cm 09/15/21 1137  Wound Width (cm) 6.5 cm 09/15/21 1137  Wound Depth (cm) 3 cm 09/15/21 1137  Wound Surface Area (cm^2) 42.25 cm^2 09/15/21 1137  Wound Volume (cm^3) 126.75 cm^3 09/15/21 1137  Tunneling (cm) 0 09/15/21 1300  Undermining (cm) 3.3 cm 12:00 09/15/21 1137  Margins Unattached edges (unapproximated) 09/19/21 1517  Drainage Amount Moderate 09/19/21 1517  Drainage Description Serosanguineous 09/19/21 1517  Treatment Debridement (Selective);Hydrotherapy (Pulse lavage);Packing (Saline gauze) 09/19/21 1517    Hydrotherapy Pulsed lavage therapy - wound location: Sacrum Pulsed Lavage with Suction (psi): 8 psi Pulsed Lavage with Suction - Normal Saline Used: 1000 mL Pulsed Lavage Tip: Tip with splash shield Selective Debridement Selective Debridement - Location: Sacrum Selective Debridement - Tools Used: Forceps, Scalpel Selective Debridement - Tissue Removed: yellow necrotic tissue and slough    Wound Assessment and Plan  Wound Therapy - Assess/Plan/Recommendations Wound Therapy - Clinical Statement: Progressing with debridement. Wound bed appears significantly improved this session. Feel pt is close to d/c from hydrotherapy. Plan is for d/c home today, however if d/c is delayed over the weekend, will follow up with hydrotherapy on Monday. Wound Therapy - Functional Problem List: Global weakness Factors Delaying/Impairing Wound Healing: Diabetes Mellitus, Incontinence, Infection - systemic/local, Immobility, Multiple medical problems Hydrotherapy Plan: Debridement, Dressing change, Patient/family education, Pulsatile lavage with suction Wound Therapy - Frequency: Other (comment) (2X / week) Wound Therapy - Current Recommendations: PT Wound Therapy - Follow Up Recommendations: dressing changes by family/patient  Wound Therapy Goals- Improve the function of patient's integumentary system by progressing the wound(s) through the phases of wound healing (inflammation - proliferation - remodeling) by: Wound Therapy Goals - Improve the function of patient's integumentary system by progressing the wound(s) through the phases of wound healing by: Decrease Necrotic Tissue to: 0 Decrease Necrotic Tissue - Progress: Updated due to goal met Increase Granulation Tissue to: 100 Increase Granulation Tissue - Progress: Updated due to goal met Goals/treatment plan/discharge plan were made with and agreed upon by patient/family: Yes Time For Goal Achievement: 7 days Wound Therapy - Potential for Goals:  Good  Goals will be updated until maximal potential achieved or discharge criteria met.  Discharge criteria: when goals achieved, discharge from hospital, MD decision/surgical intervention, no progress  towards goals, refusal/missing three consecutive treatments without notification or medical reason.  GP     Charges PT Wound Care Charges $Wound Debridement up to 20 cm: < or equal to 20 cm $PT Hydrotherapy Dressing: 2 dressings $PT PLS Gun and Tip: 1 Supply $PT Hydrotherapy Visit: 1 Visit       [name] 09/19/2021, 3:21 PM  Rolinda Roan, PT, DPT Acute Rehabilitation Services Pager: [phone] Office: [phone]

## 2021-09-19 NOTE — Progress Notes (Signed)
OT Cancellation Note  Patient Details Name: [name] MRN: [id number] DOB: Mar 18, 1966   Cancelled Treatment:    Reason Eval/Treat Not Completed: Patient declined, no reason specified (states he did not want to participate today due to plan on going home today and wants to conserve his energy.  Will follow up later today.) [name], [place]  Pager [phone] Office [place] 09/19/2021, 11:04 AM

## 2021-09-19 NOTE — Progress Notes (Signed)
Dressing changed.

## 2021-09-19 NOTE — Progress Notes (Signed)
PT Cancellation Note  Patient Details Name: [name] MRN: [id number] DOB: 1966-04-28   Cancelled Treatment:    Reason Eval/Treat Not Completed: Patient declined, no reason specified. Pt declines PT, preferring to conserve energy for discharge home.   Zenaida Niece 09/19/2021, 12:45 PM

## 2021-09-19 NOTE — Consult Note (Signed)
[place] Nurse wound follow up Wound type:Pressure Measurement:Full thickness pressure injury to sacrum and right buttock, Unstageable, 7cm x 7cm Wound bed: 60% nonviable yellow, fibrinous slough (completely obscures central portion of wound and a small part of the right buttock wound), 40% red tissue (right buttock).  Drainage (amount, consistency, odor) serous to serosanguinous, no odor  Periwound: intact Dressing procedure/placement/frequency: Requested to see by PT performing hydrotherapy for consideration of alternate wound care.  NPWT is not an option that this time as greater than 25% of the wound bed is nonviable. Patient reports that he is going to be discharged home today. He requests another hydrotherapy treatment prior to discharge. I replay this message to the PT performing hydrotherapy, L. Kirkman via Secure Chat.  Patient reports that he does not want a mattress replacement at home, and that he will  have intermittent care supporting him in that setting. I feel it will be important for him to have realistic expectations for [facility] services. He reports that he will be OOB to the wheelchair often as he has a dog that needs care and walking which he has done while in his wheelchair in the past, but not since November of 2022.  I instruct him on the importance of nutrition in his POC.    We are currently using a twice daily saline moistened gauze dressing topped with a silicone foam dressing to assist with the mechanical debridement of the nonviable tissue and support wound healing. It may be necessary to consider a wound dressing that can be performed less frequently with similar outcomes in the home setting. I recommend consideration of daily hydrogel for this purpose as this is often used in home care as an alternative to twice daily normal saline dressing changes.   If patient returns home with [facility], recommend referral to outpatient wound care center for continued debridement and oversight of  care.   If you agree, please order/arrange/schedule first appointment and transportation, if needed.  [facility] nursing team will not follow, but will remain available to this patient, the nursing and medical teams.  Please re-consult if needed. Thanks, Maudie Flakes, MSN, RN, [place], Arther Abbott  Pager# [phone]

## 2021-09-19 NOTE — TOC Progression Note (Signed)
Transition of Care [facility]) - Progression Note    Patient Details  Name: [name] MRN: [id number] Date of Birth: [birth year]  Transition of Care [facility]) CM/SW Contact  Joanne Chars, LCSW Phone Number: 09/19/2021, 10:21 AM  Clinical Narrative:   CSW met with pt and informed him that [facility] cannot offer a bed.  Pt does want to proceed with DC home.   PCP appt made at [facility] pt care center for Monday, pt informed.  Pt states friend will be able to pick him up, does not need transport home.  DME has been delivered.      Expected Discharge Plan: Skilled Nursing Facility Barriers to Discharge: SNF Pending bed offer  Expected Discharge Plan and Services Expected Discharge Plan: [place] In-house Referral: Clinical Social Work Discharge Planning Services: CM Consult Post Acute Care Choice: Durable Medical Equipment, Home Health Living arrangements for the past 2 months: Apartment Expected Discharge Date: 09/17/21               DME Arranged: Youth worker wheelchair with seat cushion DME Agency: [employer] Date DME Agency Contacted: 09/17/21 Time DME Agency Contacted: [phone] Representative spoke with at DME Agency: Freda Munro HH Arranged: RN, PT, OT, Nurse's Aide, Social Work [employer] Agency: [facility] Date [employer]: 09/15/21 Time [place]: [id number] Representative spoke with at [place]: [place] Interventions    Readmission Risk Interventions No flowsheet data found.

## 2021-09-19 NOTE — Progress Notes (Signed)
Progress Note  Patient was supposed to discharge on 09/17/2021.  At that point, he had initially declined SNF due to his poor experience with recent SNF admission.  He had insisted on going home.  However after the medical team discussed with him regarding home discharge possibly being a less than appropriate discharge location for him given lack of adequate family/friends support at home which she needs, he then agreed to a SNF admission.  However he insisted on only going to an SNF with a 5*rating.  TOC tried hard to get him into such a facility.  They were only 3 facilities available and unfortunately as of this morning, he was unable to get into any 1 of those.  Patient unwilling to consider any other SNF options and insists on being discharged home. He was seen by [facility] RN today.  Patient declined OT evaluation and wishes to preserve his energy for home discharge. TOC team has also been able to reschedule his new PCP appointment to as early as 1/16 which is good news.  He can follow-up with them regarding refill for his pain meds etc.  All his questions were answered to his satisfaction.  He reports that he will leave this evening after 2 days course of hydrotherapy after his friend gets off from work at around 5:30 PM.  Discharge summary from 1/11 has been updated with today's interview and exam findings.  Vernell Leep, MD,  FACP, [facility], [facility], [facility] (Care Management Physician Certified) [facility] [place]  To contact the attending provider between 7A-7P or the covering provider during after hours 7P-7A, please log into the web site [email or web address] and access using universal [place] password for that web site. If you do not have the password, please call the hospital operator.

## 2021-09-19 NOTE — Discharge Summary (Addendum)
Physician Discharge Summary  [name] INO:[id number] DOB: 02-18-1966  PCP: Patient, No Pcp Per (Inactive).  Patient now has a new PCP to follow-up with, set up by [facility] team.  Admitted from: Home Discharged to: Home.  Patient declines available SNF offers.  Has been denied for CIR earlier.  Admit date: 09/12/2021 Discharge date: 09/19/2021  Recommendations for Outpatient Follow-up:    Follow-up Information     [facility] Follow up.   Specialty: Home Health Services Why: the office will call to schedule home health visits Contact information: [address] [place] [address] [phone]         [name] Moore, MD. Schedule an appointment as soon as possible for a visit in 2 week(s).   Specialty: Neurosurgery Contact information: [address] [place] [address] [phone]         [name]             . Go on 10/20/2021.   Why: 1:15 pm with [name], allow 75 minutes for appointment time Contact information: [address] [address] [phone]        [name] Minion, MD Follow up in 1 week(s).   Specialty: Orthopedic Surgery Contact information: [place] [address] [place] Follow up on 09/22/2021.   Specialty: Internal Medicine Why: hospital follow up scheduled for 09/22/2021 at 10:40 am with [name] Setting NP Contact information: [place] [id number] [place] [phone]               Patient also has a appointment with infectious disease on 10/07/2021.  Home Health:  Home Health Orders (From admission, onward)     Start     Ordered   Unscheduled  [name]  At discharge       Question Answer Comment  To provide the following care/treatments PT   To provide the following care/treatments OT   To provide the following  care/treatments RN   To provide the following care/treatments [place]   To provide the following care/treatments Social work      09/17/21 1225             Equipment/Devices:     [place]  (From admission, onward)           Start     Ordered   09/17/21 0857  For home use only DME lightweight manual wheelchair with seat cushion  Once       Comments: Patient suffers from  BKA, sacral decubitus ulcers which impairs their ability to perform daily activities like bathing in the home.  A walker will not resolve  issue with performing activities of daily living. A wheelchair will allow patient to safely perform daily activities. Patient is not able to propel themselves in the home using a standard weight wheelchair due to general weakness. Patient can self propel in the lightweight wheelchair. Length of need 12 months . Accessories: elevating leg rests (ELRs), wheel locks, extensions and anti-tippers.   09/17/21 0857             Discharge Condition: Improved and stable   Code Status: Prior Diet recommendation:  Discharge Diet Orders (From admission, onward)     Start     Ordered   09/17/21 0000  Diet - low  sodium heart healthy        09/17/21 1225   09/17/21 0000  Diet Carb Modified        09/17/21 1225             Discharge Diagnoses:  Principal Problem:   Recent bacteremia due to group B Streptococcus, Streptococcus agalactiae, thoracic discitis/osteomyelitis with epidural abscess Active Problems:   Essential hypertension   Stage 3a chronic kidney disease (HCC)   Urinary hesitancy   Type 2 diabetes mellitus   S/P bilateral BKA (below knee amputation) (HCC)   Paroxysmal atrial flutter (HCC)   Sacral pressure injury of skin   Discitis of thoracic region   Back pain   Leukocytosis   Pseudohyponatremia   Normocytic anemia   Insomnia   Chronic systolic CHF (congestive heart failure) (HCC)   Hypogonadism in male   Chronic pain    Stage IV pressure ulcer of sacral region [facility])   Brief Summary: 56 year old male with history of HTN, chronic systolic CHF, NF6/OZHY, right BKA due to diabetic foot infection, comes into the hospital with worsening back pain. He recently had a prolonged hospitalization 07/21/2021-09/09/2021 with left foot infection status post BKA 07/23/21, hospital course complicated by Group B strep bacteremia with discitis/osteomyelitis with epidural abscess, new diagnosis of systolic CHF, paroxysmal a flutter, sacral decubitus ulcers.  He went to [facility] and rehab and he reports that had a terrible experience with the nursing staff, his antibiotics were missed as well as pain medications.  Developed subjective fevers/sweats, worsening back pain and was sent back to the hospital.  He does not want to go back there.  On admission he was found to have an elevated CRP at 10.4, slight leukocytosis of 11.7.     Assessment & Plan: * Recent bacteremia due to group B Streptococcus, Streptococcus agalactiae, thoracic discitis/osteomyelitis with epidural abscess- (present on admission) -This was diagnosed at his prior hospitalization.  MRI done 1/7 on admission showed continued interval improvement in T6-7 discitis/osteomyelitis with decreased p.o. in the disc space and improved erosion of the T7 superior endplate.  There is also decreased posterior epidural phlegmon and persistent moderate canal stenosis.  -He was on vancomycin from admission 11/14 for 6 weeks through 12/26, currently on cefadroxil 1000 twice daily up until 10/01/2021 -ID consulted, appreciate input.  Continue oral antibiotics for now.  Dr. Drucilla [name] saw patient 1/9 -Patient has a follow-up appointment with Dr. Coral [name] on 09/09/2021.     Sacral pressure injury of skin- (present on admission) -Noted to be infected during previous hospitalization on 12/8 and was treated with a total of 7 days of cefepime.  Wound care has been consulted during the last  hospitalization and he was receiving Hydro therapy.  Hydrotherapy only to be continued while hospitalized as per prior [facility] MD discussion with PT -Wound care and general surgery consulted, no need for surgical debridement as wound appears fairly good.  Continue local care per wound with twice daily saline dressing and collagenase.   -He plans to go home and have family/friend take care of the wound, he needs to designate someone who can come to the hospital to get teaching -As per report, patient's friend is coming in today to have wound care teaching done.  He also has procured some specialized equipment via [employer]. -Requested TOC to arrange outpatient wound care clinic follow-up as recommended by general surgery. -Patient refuses SNF at the places that he received offers from. -Better control of DM.  Remains at high  risk for worsening and recurrent hospitalization.   Pseudohyponatremia- (present on admission) - Due to high CBGs, stable   Chronic systolic CHF (congestive heart failure) ([place]) - He currently appears euvolemic.  Continue metoprolol.  Not on diuretics PTA.   Normocytic anemia- (present on admission) - Due to chronic disease, no bleeding, continue to monitor.  Stable.   S/P bilateral BKA (below knee amputation) ([place]) -during prior hospital stay, wounds appear well-healed.  Dr. Sharol Given evaluated in the hospital, recommended using stump shrinker's.  Outpatient follow-up with Dr. Sharol Given.   Essential hypertension- (present on admission) - Continue metoprolol   Paroxysmal atrial flutter ([place])- (present on admission) -He is seen normal sinus rhythm since DCCV 07/2021. Continue amiodarone and Eliquis.  Outpatient follow-up with cardiology.   Stage IV pressure ulcer of sacral region [facility])- (present on admission) - See discussion above between surgery and wound care  Pressure Injury 07/27/21 Sacrum Mid Unstageable - Full thickness tissue loss in which the base of the injury is covered by  slough (yellow, tan, gray, green or brown) and/or eschar (tan, brown or black) in the wound bed. small pink pressure ulcer with bre (Active)  07/27/21 0255  Location: Sacrum  Location Orientation: Mid  Staging: Unstageable - Full thickness tissue loss in which the base of the injury is covered by slough (yellow, tan, gray, green or brown) and/or eschar (tan, brown or black) in the wound bed.  Wound Description (Comments): small pink pressure ulcer with break in skin 08/12/21; updated to unstageable pressure injury  Present on Admission: Yes    [place] RN input from today appreciated.  Full-thickness pressure injury to sacrum and right buttock, unstageable, 7 cm x 7 cm.  PT had requested their input.  Patient declines mattress replacement at home.  They also recommended once daily hydrogel dressing for ease of doing once daily rather than previously recommended twice daily dressing.  I communicated with TOC who indicated that home health services should be able to provide him with services and instructions.  As per recommendations, has an outpatient appointment with the wound care clinic.   Chronic pain -Opioid dependent.  Continues to complain of pain.  Continue current dose of OxyContin and as needed oral Dilaudid that was being utilized in the hospital with close outpatient follow-up with PCP.  Also continue gabapentin for neuropathic pain.  May consider outpatient pain clinic referral.  Reviewed PDMP and last refill of oxycodone was in October 2022.   Hypogonadism in male -Continue testosterone   Insomnia - Trazodone as needed   Leukocytosis- (present on admission) -Possibly related to recent missed doses of antibiotics. Recheck blood cultures, currently no growth to date.  Stable.   Discitis of thoracic region- (present on admission) - See above   Type 2 diabetes mellitus- (present on admission) -Continue insulin regimen as below, he is hyperglycemic.  Monitor CBGs and adjust insulin as  needed.  CBGs mildly uncontrolled.  Continue Lantus and mealtime NovoLog at discharge.  Close outpatient follow-up with PCP.  A1c 13.8 on 07/22/2021 indicating poor outpatient control.  Since he has been in the hospital, improved control and may consider repeating A1c in a month or so.    Urinary hesitancy- (present on admission) -During prior hospitalization patient had retention type symptoms requiring repeated in and out caths. Continue finasteride and tamsulosin   Stage 3a chronic kidney disease ([place])- (present on admission) -Baseline creatinine ranging 1.0-1.4, currently at baseline     Consultations: General surgery ID  Procedures: Hydrotherapy  Discharge Instructions  Discharge Instructions     Call MD for:  difficulty breathing, headache or visual disturbances   Complete by: As directed    Call MD for:  extreme fatigue   Complete by: As directed    Call MD for:  persistant dizziness or light-headedness   Complete by: As directed    Call MD for:  persistant nausea and vomiting   Complete by: As directed    Call MD for:  redness, tenderness, or signs of infection (pain, swelling, redness, odor or green/yellow discharge around incision site)   Complete by: As directed    Call MD for:  severe uncontrolled pain   Complete by: As directed    Call MD for:  temperature >100.4   Complete by: As directed    Diet - low sodium heart healthy   Complete by: As directed    Diet Carb Modified   Complete by: As directed    Discharge wound care:   Complete by: As directed    Wound care to sacral and right buttock pressure injury:  Cleanse with Normal Saline, pat dry. Protect surrounding skin with skin prep wipe and allow to dry (e.g., [place]). Fill defect at right buttock to skin level and also cover sacral wound with gauze impregnated with hydrogel. Top with ABD pad and secure with Medipore tape. Change daily and PRN drainage onto the exterior of dressing or soiling.   Increase  activity slowly   Complete by: As directed         Medication List     STOP taking these medications    Chlorhexidine Gluconate Cloth 2 % Pads   collagenase ointment Commonly known as: SANTYL   HYDROcodone-acetaminophen 5-325 MG tablet Commonly known as: NORCO/VICODIN   insulin detemir 100 UNIT/ML injection Commonly known as: LEVEMIR   liver oil-zinc oxide 40 % ointment Commonly known as: DESITIN   magnesium hydroxide 400 MG/5ML suspension Commonly known as: MILK OF MAGNESIA   Methocarbamol 1000 MG Tabs   nystatin cream Commonly known as: MYCOSTATIN   Oxycodone HCl 10 MG Tabs Replaced by: oxyCODONE 40 mg 12 hr tablet   oxyCODONE-acetaminophen 5-325 MG tablet Commonly known as: PERCOCET/ROXICET   sodium hypochlorite 0.125 % Soln Commonly known as: DAKIN'S 1/4 STRENGTH       TAKE these medications    amiodarone 200 MG tablet Commonly known as: PACERONE Take 1 tablet (200 mg total) by mouth daily.   apixaban 5 MG Tabs tablet Commonly known as: ELIQUIS Take 1 tablet (5 mg total) by mouth 2 (two) times daily.   cefadroxil 500 MG capsule Commonly known as: DURICEF Take 2 capsules (1,000 mg total) by mouth 2 (two) times daily for 14 days. What changed:  when to take this additional instructions   Ensure Plus Liqd Take 237 mLs by mouth 2 (two) times daily between meals. What changed: Another medication with the same name was removed. Continue taking this medication, and follow the directions you see here.   finasteride 5 MG tablet Commonly known as: PROSCAR Take 1 tablet (5 mg total) by mouth daily.   gabapentin 400 MG capsule Commonly known as: NEURONTIN Take 1 capsule (400 mg total) by mouth 3 (three) times daily.   HYDROmorphone 4 MG tablet Commonly known as: DILAUDID Take 1 tablet (4 mg total) by mouth every 6 (six) hours as needed for severe pain.   insulin aspart 100 UNIT/ML injection Commonly known as: novoLOG Inject 6 Units into the skin  3 (three)  times daily with meals.   Insulin Pen Needle 32G X 4 MM Misc Use as directed   Lantus SoloStar 100 UNIT/ML Solostar Pen Generic drug: insulin glargine Inject 36 Units into the skin 2 (two) times daily. What changed:  medication strength when to take this   metoprolol succinate 25 MG 24 hr tablet Commonly known as: TOPROL-XL Take 1 tablet (25 mg total) by mouth daily.   multivitamin with minerals Tabs tablet Take 1 tablet by mouth daily.   Please see a separate progress note in [facility] from 09/23/2021.  Patient was unable to fill the above prescription at the [facility] who did not have this in stock.  Thereby I electronically sent in to [facility] a prescription for oxycodone 30 mg 12-hour tablet, 1 tablet (30 mg total) twice daily x7 days (14 tabs) without refill.   tamsulosin 0.4 MG Caps capsule Commonly known as: FLOMAX Take 1 capsule (0.4 mg total) by mouth daily.   testosterone 50 MG/5GM (1%) Gel Commonly known as: ANDROGEL Place 5 g onto the skin daily. What changed: Another medication with the same name was removed. Continue taking this medication, and follow the directions you see here.   traZODone 150 MG tablet Commonly known as: DESYREL Take 1 tablet (150 mg total) by mouth at bedtime as needed for sleep.       Allergies  Allergen Reactions   Bactrim [Sulfamethoxazole-Trimethoprim]    Ceprotin [Protein C Concentrate (Human)]    Ciprofloxacin Other (See Comments)    Kidney function   Levaquin [Levofloxacin]       Procedures/Studies: MR THORACIC SPINE W WO CONTRAST  Result Date: 09/13/2021 CLINICAL DATA:  Thoracic osteomyelitis discitis. EXAM: MRI THORACIC AND LUMBAR SPINE WITHOUT AND WITH CONTRAST TECHNIQUE: Multiplanar and multiecho pulse sequences of the thoracic and lumbar spine were obtained without and with intravenous contrast. CONTRAST:  68mL GADAVIST GADOBUTROL 1 MMOL/ML IV SOLN COMPARISON:  MRI thoracic spine dated August 22, 2021. MRI thoracic and lumbar spine dated August 07, 2021. FINDINGS: MRI THORACIC SPINE FINDINGS Alignment:  Physiologic. Vertebrae: Improved T6-T7 discitis osteomyelitis with decreased pus in the disc space. T7 superior endplate erosion has also improved. Intense vertebral body and posterior element marrow edema and enhancement from T5 through T8 is not significantly changed. Cord: Decreased posterior epidural phlegmon compared to prior study without significant residual rim enhancing fluid collection. Similar ventral epidural phlegmon from T5 through T7. Continued moderate spinal canal stenosis at T6-T7 due to epidural phlegmon. Paraspinal and other soft tissues: Small bilateral pleural effusions have decreased since the prior study. Patchy opacities in both lower lobes. Extensive paravertebral edema and enhancement from T5 through T9 is similar. Disc levels: No additional high-grade spinal canal or neuroforaminal stenosis. MRI LUMBAR SPINE FINDINGS Segmentation:  Standard. Alignment:  Physiologic. Vertebrae: No fracture, evidence of discitis, or bone lesion. Similar acute on chronic degenerative endplate marrow signal changes at L5-S1 with minimal endplate enhancement and trace fluid in the disc space. Conus medullaris: Extends to the L1-L2 level and appears normal. No intradural enhancement. Paraspinal and other soft tissues: No paravertebral inflammatory changes. Similar bilateral paraspinous muscle edema without enhancement. Unchanged mild bilateral hydronephrosis. Unchanged complex left renal cyst. Disc levels: T12-L1: Unchanged tiny left paracentral disc protrusion. No stenosis. L1-L2:  Unchanged mild disc bulging. No stenosis. L2-L3: Unchanged tiny left paracentral disc protrusion and annular fissure. No stenosis. L3-L4:  Negative. L4-L5: Unchanged small broad-based posterior disc protrusion and mild bilateral facet arthropathy. Unchanged mild bilateral neuroforaminal stenosis. No spinal  canal stenosis.  L5-S1: Unchanged severe disc height loss, small circumferential disc osteophyte complex, and superimposed small central disc protrusion. Unchanged mild to moderate bilateral facet arthropathy. Unchanged moderate right greater than left neuroforaminal stenosis. No spinal canal stenosis. IMPRESSION: Thoracic spine: 1. Continued interval improvement in T6-T7 discitis osteomyelitis with decreased pus in the disc space and improved erosion of the T7 superior endplate. Decreased posterior epidural phlegmon. Continued moderate spinal canal stenosis at T6-T7 due to epidural phlegmon. 2. Extensive marrow edema and enhancement involving the T5 through T8 vertebral bodies and posterior elements is not significantly changed and remains consistent with osteomyelitis. Extensive paravertebral edema and enhancement from T5 through T9 is also similar to prior study. 3. Small bilateral pleural effusions have decreased since the prior study. Patchy opacities in both lower lobes may be due to atelectasis or pneumonia. Lumbar spine: 1. No evidence of osteomyelitis-discitis. 2. Unchanged lumbar spondylosis as described above. Unchanged moderate right greater than left neuroforaminal stenosis at L5-S1. 3. Unchanged mild bilateral hydronephrosis. Electronically Signed   By: Titus Dubin M.D.   On: 09/13/2021 17:08   MR Lumbar Spine W Wo Contrast  Result Date: 09/13/2021 CLINICAL DATA:  Thoracic osteomyelitis discitis. EXAM: MRI THORACIC AND LUMBAR SPINE WITHOUT AND WITH CONTRAST TECHNIQUE: Multiplanar and multiecho pulse sequences of the thoracic and lumbar spine were obtained without and with intravenous contrast. CONTRAST:  44mL GADAVIST GADOBUTROL 1 MMOL/ML IV SOLN COMPARISON:  MRI thoracic spine dated August 22, 2021. MRI thoracic and lumbar spine dated August 07, 2021. FINDINGS: MRI THORACIC SPINE FINDINGS Alignment:  Physiologic. Vertebrae: Improved T6-T7 discitis osteomyelitis with decreased pus in the disc space. T7  superior endplate erosion has also improved. Intense vertebral body and posterior element marrow edema and enhancement from T5 through T8 is not significantly changed. Cord: Decreased posterior epidural phlegmon compared to prior study without significant residual rim enhancing fluid collection. Similar ventral epidural phlegmon from T5 through T7. Continued moderate spinal canal stenosis at T6-T7 due to epidural phlegmon. Paraspinal and other soft tissues: Small bilateral pleural effusions have decreased since the prior study. Patchy opacities in both lower lobes. Extensive paravertebral edema and enhancement from T5 through T9 is similar. Disc levels: No additional high-grade spinal canal or neuroforaminal stenosis. MRI LUMBAR SPINE FINDINGS Segmentation:  Standard. Alignment:  Physiologic. Vertebrae: No fracture, evidence of discitis, or bone lesion. Similar acute on chronic degenerative endplate marrow signal changes at L5-S1 with minimal endplate enhancement and trace fluid in the disc space. Conus medullaris: Extends to the L1-L2 level and appears normal. No intradural enhancement. Paraspinal and other soft tissues: No paravertebral inflammatory changes. Similar bilateral paraspinous muscle edema without enhancement. Unchanged mild bilateral hydronephrosis. Unchanged complex left renal cyst. Disc levels: T12-L1: Unchanged tiny left paracentral disc protrusion. No stenosis. L1-L2:  Unchanged mild disc bulging. No stenosis. L2-L3: Unchanged tiny left paracentral disc protrusion and annular fissure. No stenosis. L3-L4:  Negative. L4-L5: Unchanged small broad-based posterior disc protrusion and mild bilateral facet arthropathy. Unchanged mild bilateral neuroforaminal stenosis. No spinal canal stenosis. L5-S1: Unchanged severe disc height loss, small circumferential disc osteophyte complex, and superimposed small central disc protrusion. Unchanged mild to moderate bilateral facet arthropathy. Unchanged moderate  right greater than left neuroforaminal stenosis. No spinal canal stenosis. IMPRESSION: Thoracic spine: 1. Continued interval improvement in T6-T7 discitis osteomyelitis with decreased pus in the disc space and improved erosion of the T7 superior endplate. Decreased posterior epidural phlegmon. Continued moderate spinal canal stenosis at T6-T7 due to epidural phlegmon. 2. Extensive marrow edema  and enhancement involving the T5 through T8 vertebral bodies and posterior elements is not significantly changed and remains consistent with osteomyelitis. Extensive paravertebral edema and enhancement from T5 through T9 is also similar to prior study. 3. Small bilateral pleural effusions have decreased since the prior study. Patchy opacities in both lower lobes may be due to atelectasis or pneumonia. Lumbar spine: 1. No evidence of osteomyelitis-discitis. 2. Unchanged lumbar spondylosis as described above. Unchanged moderate right greater than left neuroforaminal stenosis at L5-S1. 3. Unchanged mild bilateral hydronephrosis. Electronically Signed   By: Titus Dubin M.D.   On: 09/13/2021 17:08      Subjective: No new complaints.  Pain appears to be controlled.  States that his male friend has picked up prescriptions that were called in on 1/11.  Wishes to complete today's hydrotherapy treatment prior to discharge.  Indicates that his friend will be by to pick him up around 5:30 PM after her work.  As per discussion with patient and TOC team, patient was unable to get into the 3 possible SNF with his preferred 5*rating.  Patient has repeatedly declined to consider other SNF options with lower rating and insists on being discharged home.  He is well aware of the risks.  Discharge Exam:  Vitals:   09/18/21 0745 09/18/21 1519 09/19/21 0623 09/19/21 0754  BP: 129/74 (!) 115/57 139/64 (!) 119/56  Pulse: 74 78  90  Resp: 17 17 20 19   Temp: 97.7 F (36.5 C) 98.3 F (36.8 C)  97.7 F (36.5 C)  TempSrc: Oral Oral   Oral  SpO2: 96% 97%  95%  Weight:        General: Pleasant young male, moderately built and overweight.  Pt sitting up comfortably in bed without distress. Cardiovascular: S1 & S2 heard, RRR, S1/S2 +. No murmurs, rubs, gallops or clicks. No JVD or pedal edema.  Telemetry personally reviewed: Sinus rhythm.  Discontinue telemetry. Respiratory: Clear to auscultation without wheezing, rhonchi or crackles. No increased work of breathing. Abdominal:  Non distended, non tender & soft. No organomegaly or masses appreciated. Normal bowel sounds heard. CNS: Alert and oriented. No focal deficits. Extremities: no edema, no cyanosis.  Bilateral BKA healed stumps without acute findings.  Right lower extremity with muscle wasting. Skin: Details as per WOC RN input from today.  Full-thickness pressure injury to sacrum and right buttock, unstageable, 7 cm x 7 cm.  Wound bed: 60% nonviable yellow, fibrinous slough (completely obscures central portion of wound and a small part of the right buttock wound), 40% red tissue (right buttock).  Drainage (amount, consistency, odor) serous to serosanguinous, no odor  Periwound: intact   The results of significant diagnostics from this hospitalization (including imaging, microbiology, ancillary and laboratory) are listed below for reference.     Microbiology: Recent Results (from the past 240 hour(s))  Resp Panel by RT-PCR (Flu A&B, Covid) Nasopharyngeal Swab     Status: None   Collection Time: 09/09/21  3:53 PM   Specimen: Nasopharyngeal Swab; Nasopharyngeal(NP) swabs in vial transport medium  Result Value Ref Range Status   SARS Coronavirus 2 by RT PCR NEGATIVE NEGATIVE Final    Comment: (NOTE) SARS-CoV-2 target nucleic acids are NOT DETECTED.  The SARS-CoV-2 RNA is generally detectable in upper respiratory specimens during the acute phase of infection. The lowest concentration of SARS-CoV-2 viral copies this assay can detect is 138 copies/mL. A negative  result does not preclude SARS-Cov-2 infection and should not be used as the sole basis for treatment  or other patient management decisions. A negative result may occur with  improper specimen collection/handling, submission of specimen other than nasopharyngeal swab, presence of viral mutation(s) within the areas targeted by this assay, and inadequate number of viral copies(<138 copies/mL). A negative result must be combined with clinical observations, patient history, and epidemiological information. The expected result is Negative.  Fact Sheet for Patients:  EntrepreneurPulse.com.au  Fact Sheet for Healthcare Providers:  IncredibleEmployment.be  This test is no t yet approved or cleared by the [place] FDA and  has been authorized for detection and/or diagnosis of SARS-CoV-2 by FDA under an Emergency Use Authorization (EUA). This EUA will remain  in effect (meaning this test can be used) for the duration of the COVID-19 declaration under Section 564(b)(1) of the Act, 21 U.S.C.section 360bbb-3(b)(1), unless the authorization is terminated  or revoked sooner.       Influenza A by PCR NEGATIVE NEGATIVE Final   Influenza B by PCR NEGATIVE NEGATIVE Final    Comment: (NOTE) The Xpert Xpress SARS-CoV-2/FLU/RSV plus assay is intended as an aid in the diagnosis of influenza from Nasopharyngeal swab specimens and should not be used as a sole basis for treatment. Nasal washings and aspirates are unacceptable for Xpert Xpress SARS-CoV-2/FLU/RSV testing.  Fact Sheet for Patients: EntrepreneurPulse.com.au  Fact Sheet for Healthcare Providers: IncredibleEmployment.be  This test is not yet approved or cleared by the [place] FDA and has been authorized for detection and/or diagnosis of SARS-CoV-2 by FDA under an Emergency Use Authorization (EUA). This EUA will remain in effect (meaning this test can be used)  for the duration of the COVID-19 declaration under Section 564(b)(1) of the Act, 21 U.S.C. section 360bbb-3(b)(1), unless the authorization is terminated or revoked.  Performed at [facility], [place] [address], [place], [place] [address]   Resp Panel by RT-PCR (Flu A&B, Covid) Nasopharyngeal Swab     Status: None   Collection Time: 09/13/21 12:34 AM   Specimen: Nasopharyngeal Swab; Nasopharyngeal(NP) swabs in vial transport medium  Result Value Ref Range Status   SARS Coronavirus 2 by RT PCR NEGATIVE NEGATIVE Final    Comment: (NOTE) SARS-CoV-2 target nucleic acids are NOT DETECTED.  The SARS-CoV-2 RNA is generally detectable in upper respiratory specimens during the acute phase of infection. The lowest concentration of SARS-CoV-2 viral copies this assay can detect is 138 copies/mL. A negative result does not preclude SARS-Cov-2 infection and should not be used as the sole basis for treatment or other patient management decisions. A negative result may occur with  improper specimen collection/handling, submission of specimen other than nasopharyngeal swab, presence of viral mutation(s) within the areas targeted by this assay, and inadequate number of viral copies(<138 copies/mL). A negative result must be combined with clinical observations, patient history, and epidemiological information. The expected result is Negative.  Fact Sheet for Patients:  EntrepreneurPulse.com.au  Fact Sheet for Healthcare Providers:  IncredibleEmployment.be  This test is no t yet approved or cleared by the [place] FDA and  has been authorized for detection and/or diagnosis of SARS-CoV-2 by FDA under an Emergency Use Authorization (EUA). This EUA will remain  in effect (meaning this test can be used) for the duration of the COVID-19 declaration under Section 564(b)(1) of the Act, 21 U.S.C.section 360bbb-3(b)(1), unless the authorization is terminated   or revoked sooner.       Influenza A by PCR NEGATIVE NEGATIVE Final   Influenza B by PCR NEGATIVE NEGATIVE Final    Comment: (NOTE) The  Xpert Xpress SARS-CoV-2/FLU/RSV plus assay is intended as an aid in the diagnosis of influenza from Nasopharyngeal swab specimens and should not be used as a sole basis for treatment. Nasal washings and aspirates are unacceptable for Xpert Xpress SARS-CoV-2/FLU/RSV testing.  Fact Sheet for Patients: EntrepreneurPulse.com.au  Fact Sheet for Healthcare Providers: IncredibleEmployment.be  This test is not yet approved or cleared by the [place] FDA and has been authorized for detection and/or diagnosis of SARS-CoV-2 by FDA under an Emergency Use Authorization (EUA). This EUA will remain in effect (meaning this test can be used) for the duration of the COVID-19 declaration under Section 564(b)(1) of the Act, 21 U.S.C. section 360bbb-3(b)(1), unless the authorization is terminated or revoked.  Performed at [facility], [place] [address], [place], [place] [address]   Surgical pcr screen     Status: None   Collection Time: 09/13/21 12:28 PM   Specimen: Nasal Mucosa; Nasal Swab  Result Value Ref Range Status   MRSA, PCR NEGATIVE NEGATIVE Final   Staphylococcus aureus NEGATIVE NEGATIVE Final    Comment: (NOTE) The Xpert SA Assay (FDA approved for NASAL specimens in patients 6 years of age and older), is one component of a comprehensive surveillance program. It is not intended to diagnose infection nor to guide or monitor treatment. Performed at [facility], [place] [address] [place], [place] [address]   Culture, blood (routine x 2)     Status: None   Collection Time: 09/13/21 11:45 PM   Specimen: BLOOD RIGHT HAND  Result Value Ref Range Status   Specimen Description BLOOD RIGHT HAND  Final   Special Requests   Final    BOTTLES DRAWN AEROBIC AND ANAEROBIC Blood Culture adequate volume    Culture   Final    NO GROWTH 5 DAYS Performed at [facility], [place] [address], [place], [place] [address]    Report Status 09/19/2021 FINAL  Final  Culture, blood (routine x 2)     Status: None   Collection Time: 09/13/21 11:45 PM   Specimen: BLOOD LEFT HAND  Result Value Ref Range Status   Specimen Description BLOOD LEFT HAND  Final   Special Requests   Final    BOTTLES DRAWN AEROBIC AND ANAEROBIC Blood Culture adequate volume   Culture   Final    NO GROWTH 5 DAYS Performed at [facility], [place] [address], [place], [place] [address]    Report Status 09/19/2021 FINAL  Final  Resp Panel by RT-PCR (Flu A&B, Covid) Nasopharyngeal Swab     Status: None   Collection Time: 09/18/21  9:58 AM   Specimen: Nasopharyngeal Swab; Nasopharyngeal(NP) swabs in vial transport medium  Result Value Ref Range Status   SARS Coronavirus 2 by RT PCR NEGATIVE NEGATIVE Final    Comment: (NOTE) SARS-CoV-2 target nucleic acids are NOT DETECTED.  The SARS-CoV-2 RNA is generally detectable in upper respiratory specimens during the acute phase of infection. The lowest concentration of SARS-CoV-2 viral copies this assay can detect is 138 copies/mL. A negative result does not preclude SARS-Cov-2 infection and should not be used as the sole basis for treatment or other patient management decisions. A negative result may occur with  improper specimen collection/handling, submission of specimen other than nasopharyngeal swab, presence of viral mutation(s) within the areas targeted by this assay, and inadequate number of viral copies(<138 copies/mL). A negative result must be combined with clinical observations, patient history, and epidemiological information. The expected result is Negative.  Fact Sheet for Patients:  EntrepreneurPulse.com.au  Fact Sheet for Healthcare Providers:  IncredibleEmployment.be  This test is no t yet approved or cleared by the [place] FDA and  has been authorized for detection and/or diagnosis of SARS-CoV-2 by FDA under an Emergency Use Authorization (EUA). This EUA will remain  in effect (meaning this test can be used) for the duration of the COVID-19 declaration under Section 564(b)(1) of the Act, 21 U.S.C.section 360bbb-3(b)(1), unless the authorization is terminated  or revoked sooner.       Influenza A by PCR NEGATIVE NEGATIVE Final   Influenza B by PCR NEGATIVE NEGATIVE Final    Comment: (NOTE) The Xpert Xpress SARS-CoV-2/FLU/RSV plus assay is intended as an aid in the diagnosis of influenza from Nasopharyngeal swab specimens and should not be used as a sole basis for treatment. Nasal washings and aspirates are unacceptable for Xpert Xpress SARS-CoV-2/FLU/RSV testing.  Fact Sheet for Patients: EntrepreneurPulse.com.au  Fact Sheet for Healthcare Providers: IncredibleEmployment.be  This test is not yet approved or cleared by the [place] FDA and has been authorized for detection and/or diagnosis of SARS-CoV-2 by FDA under an Emergency Use Authorization (EUA). This EUA will remain in effect (meaning this test can be used) for the duration of the COVID-19 declaration under Section 564(b)(1) of the Act, 21 U.S.C. section 360bbb-3(b)(1), unless the authorization is terminated or revoked.  Performed at [facility], [place] [address], [place],  [address]      Labs: CBC: Recent Labs  Lab 09/13/21 0034 09/14/21 0318 09/15/21 0317 09/16/21 0206 09/17/21 0309  WBC 11.7* 11.2* 10.0 11.1* 12.8*  NEUTROABS 10.1*  --   --   --   --   HGB 12.7* 11.6* 11.7* 11.1* 11.3*  HCT 38.4* 34.9* 37.1* 33.6* 35.1*  MCV 88.7 88.6 90.5 89.4 90.2  PLT 313 337 350 344 440    Basic Metabolic Panel: Recent Labs  Lab 09/13/21 0034 09/14/21 0318 09/15/21 0317 09/16/21 0206 09/17/21 0309  NA 130* 130* 134* 132* 134*  K 4.0 4.2 4.1 4.3 4.1  CL 97* 95* 97*  97* 100  CO2 22 23 30 28 27   GLUCOSE 264* 412* 224* 285* 151*  BUN 14 23* 19 25* 18  CREATININE 1.09 1.22 1.20 1.32* 1.16  CALCIUM 8.8* 8.4* 8.8* 8.3* 8.8*    Liver Function Tests: Recent Labs  Lab 09/16/21 0206  AST 25  ALT 54*  ALKPHOS 102  BILITOT 0.2*  PROT 5.9*  ALBUMIN 1.9*    CBG: Recent Labs  Lab 09/18/21 1106 09/18/21 1622 09/18/21 2146 09/19/21 0755 09/19/21 1156  GLUCAP 294* 214* 199* 265* 296*    Time coordinating discharge: 55 minutes  SIGNED:  Vernell Leep, MD,  FACP, [facility], [facility], [facility] (Care Management Physician Certified). [facility]  To contact the attending provider between 7A-7P or the covering provider during after hours 7P-7A, please log into the web site [email or web address] and access using universal [place] password for that web site. If you do not have the password, please call the hospital operator.

## 2021-09-22 ENCOUNTER — Ambulatory Visit: Payer: Medicare HMO | Admitting: Nurse Practitioner

## 2021-09-22 NOTE — Progress Notes (Signed)
Received call from patient this morning stating that he does not have enough pain medication. Patient was scheduled a PCP appointment for today, but patient stated that he was not able to make that appointment, so he rescheduled for the 30th. Patient stated that he has no one to help him clean up, and that he had to set up his own [name]. Advised that [facility] was arranged and can take 48 hours pot discharge for agency follow up. Patient stated that he wasn't aware of that. Discussed call briefly with discharging MD this AM - controlled substances were prescribed with intent for patient to follow up with new PCP today. Patient expressed his anger blaming CM and hospital.   Later this afternoon received transfer call from patient's ex wife in [place] who vented her anger at [facility] and expressed her concerns about care received at hospital stating that if there was not an appropriate facility that appropriate services should have been set up for home. Patient cannot afford to pay for custodial services d/t being on disability.   Discussed calls with supervisor, Denyse Amass. Message left with Office of Patient Experience.   Manya Silvas, RN MSN CCM Transitions of Care [phone]

## 2021-09-23 ENCOUNTER — Other Ambulatory Visit: Payer: Self-pay | Admitting: Internal Medicine

## 2021-09-23 DIAGNOSIS — M4644 Discitis, unspecified, thoracic region: Secondary | ICD-10-CM

## 2021-09-23 MED ORDER — OXYCONTIN 30 MG PO T12A: 30.0000 mg | EXTENDED_RELEASE_TABLET | Freq: Two times a day (BID) | ORAL | 0 refills | Status: AC

## 2021-09-23 NOTE — Progress Notes (Signed)
[place] follow up re concern    I had done patient's discharge orders on 09/17/2021.  Medications included his OxyContin 40 mg tablets.  Prescriptions had been sent to then listed [facility].  Patient eventually left the hospital on 09/19/2021.  Since 09/22/2021, patient has been repeatedly calling the hospital stating that the [facility] do not have the OxyContin and supply.  I called his [facility] and discussed with Joe, pharmacist on 1/16 who confirmed that they and likely their other [facility] pharmacies locally do not have the OxyContin for the next 2 to 3 weeks.  He also advised that even if they had it at another one of their [facility], he would not be able to internally transfer the prescription.  MD would have to call in new prescription.  They would not take a verbal order given the controlled substance.  Same would apply to any other known Product/process development scientist.  I attempted to go back in the patient's chart and send prescription for OxyContin to another pharmacy but it would not let me saying patient had left the hospital more than 2 hours.  I even sought assistance with one of my orthopedic colleagues who does ambulatory work and still were unsuccessful.  Home health social worker called the floor [facility] team today to reiterate that patient could not get the OxyContin through his new PCP who would not fill it.  I called the new pharmacy/[facility] that patient wanted [place] to call the prescription to.  They only have OxyContin 15 mg, 30 mg and 60 mg tablet.  Finally with the assistance of one of my hospitalist colleagues, utilizing several steps (which I did not know from before), sent in prescription to patient's newly listed pharmacy for OxyContin 30 mg twice daily x1 week without refills  TOC team called the [facility] and confirmed receipt of the prescription and availability of the medications to fill.  I also called the previous [place] [facility] and canceled  the old prescription.  Vernell Leep, MD,  FACP, [facility], [facility], [facility] (Care Management Physician Certified) [facility] [place]  To contact the attending provider between 7A-7P or the covering provider during after hours 7P-7A, please log into the web site [email or web address] and access using universal [place] password for that web site. If you do not have the password, please call the hospital operator.

## 2021-09-24 NOTE — Progress Notes (Signed)
Received message the patient's ex wife wanted to talk to [name] about his care snf disposition.  TCT patient to obtain permission to talk to [name] ( ex-spouse in [place]). Patient gave me permission to talk to her and verified the telephone number [phone]).  TCT [name], she voiced her concerning about the patient should have went to a SNF. I informed her that would have been the best option but he chose to go home with [facility] services provided by [facility]. [name] also stated that [facility] was not coming out as scheduled. [name] with [facility] called concerning visits. Lots of emotional support given. Also informed her that the SW worked diligently on the case  and the patient had 2 bed offers but the patient refused because he wanted a SNF with a 5 star rating. All questions answered.  Aneta Mins Transition of Care Supervisor [phone]

## 2021-09-25 ENCOUNTER — Telehealth: Payer: Self-pay | Admitting: Orthopedic Surgery

## 2021-09-25 ENCOUNTER — Other Ambulatory Visit (HOSPITAL_COMMUNITY): Payer: Self-pay

## 2021-09-25 ENCOUNTER — Other Ambulatory Visit: Payer: Self-pay | Admitting: Internal Medicine

## 2021-09-25 NOTE — Telephone Encounter (Signed)
I called pt to set his appt but he states he won't be able to come in right now because he doesn't have his catheter yet and he's wheel chair bound. Pt states he will call to set an appt when he has himself situated more.

## 2021-09-25 NOTE — Telephone Encounter (Signed)
-----   Message from [name], [place] sent at 09/23/2021  2:18 PM EST ----- Regarding: follow up This pt was d/c from the hospital 09/19/2021 and need follow up in the office. Can you call to make an appt with [name] this week? Let mek now and I can open a spot.

## 2021-09-25 NOTE — Telephone Encounter (Signed)
Noted  

## 2021-09-25 NOTE — Progress Notes (Signed)
I got an e-mail from the [place] office today for an Androgel pre authorization request. This was prescribed on 09/17/2021. First I called the covermymeds phone line and spoke to [place] following which I spoke to Diane at the [facility] pre authorization phone line. I completed the necessary paperwork. Diane advised me that it would take an additional 24-72 hours for determination decision which will be faxed to the [place] office. The reference number: [id number] and number to call for questions: [phone].  Vernell Leep, MD,  FACP, [facility], [facility], [facility] (Care Management Physician Certified) [facility] [place]  To contact the attending provider between 7A-7P or the covering provider during after hours 7P-7A, please log into the web site [email or web address] and access using universal [place] password for that web site. If you do not have the password, please call the hospital operator.

## 2021-09-29 NOTE — Progress Notes (Deleted)
Cardiology Clinic Note   Patient Name: [name] Date of Encounter: 09/29/2021  Primary Care Provider:  Patient, No Pcp Per (Inactive) Primary Cardiologist:  Sanda Klein, MD  Patient Profile    [name] 56 year old male presents to the clinic today for follow-up evaluation of his chronic diastolic/systolic CHF, paroxysmal atrial flutter, and essential hypertension.  Past Medical History    Past Medical History:  Diagnosis Date   Chronic diastolic CHF (congestive heart failure) ([place])    Complication of anesthesia    Depression    Diabetes mellitus without complication ([place])    Hypertension    Past Surgical History:  Procedure Laterality Date   AMPUTATION Left 07/23/2021   Procedure: LEFT BELOW KNEE AMPUTATION;  Surgeon: [name], MD;  Location: [employer];  Service: Orthopedics;  Laterality: Left;   BELOW KNEE LEG AMPUTATION Right    BUBBLE STUDY  07/29/2021   Procedure: BUBBLE STUDY;  Surgeon: Sueanne Margarita, MD;  Location: [place];  Service: Cardiovascular;;   CARDIOVERSION N/A 07/29/2021   Procedure: CARDIOVERSION;  Surgeon: Sueanne Margarita, MD;  Location: [facility] ENDOSCOPY;  Service: Cardiovascular;  Laterality: N/A;   RADIOLOGY WITH ANESTHESIA N/A 08/05/2021   Procedure: MRI LUMBAR WITH AND WITHOUT; THORASIC SPINE WITH AND WITHOUT WITH ANESTHESIA;  Surgeon: Radiologist, Medication, MD;  Location: [place];  Service: Radiology;  Laterality: N/A;   RADIOLOGY WITH ANESTHESIA N/A 08/07/2021   Procedure: MRI WITH LUMBER WITH AND WITHOUT CONTRAST,THORACIC WITH AND WITHOUT CONTRAST;  Surgeon: Radiologist, Medication, MD;  Location: [place];  Service: Radiology;  Laterality: N/A;   RADIOLOGY WITH ANESTHESIA N/A 09/13/2021   Procedure: MRI WITH ANESTHESIA;  Surgeon: Luanne Bras, MD;  Location: [place];  Service: Radiology;  Laterality: N/A;   TEE WITHOUT CARDIOVERSION N/A 07/29/2021   Procedure: TRANSESOPHAGEAL ECHOCARDIOGRAM (TEE);  Surgeon: Sueanne Margarita, MD;   Location[facility] ENDOSCOPY;  Service: Cardiovascular;  Laterality: N/A;    Allergies  Allergies  Allergen Reactions   Bactrim [Sulfamethoxazole-Trimethoprim]    Ceprotin [Protein C Concentrate (Human)]    Ciprofloxacin Other (See Comments)    Kidney function   Levaquin [Levofloxacin]     History of Present Illness    [name] has a PMH of essential hypertension, chronic systolic and diastolic CHF, paroxysmal atrial fibrillation, type 2 diabetes, hypogonadism, stage III CKD, depression with anxiety, bilateral BKA, back pain, and stage IV sacral ulcer.  He was admitted to the hospital on 07/22/2021 and discharged on 09/10/2021.  He was initially admitted to ICU with sepsis due to diabetic foot infection of his left lower extremity.  He underwent transtibial amputation.  His blood cultures were positive for Streptococcus.  He was diagnosed with new onset systolic CHF.  His LVEF on echo was 35%.  This was felt to be due to arrhythmias of her sepsis.  He underwent TEE and DCCV.  He is not a candidate for ACE ARB Arni due to significant kidney damage/disease.  He was continued on metoprolol, hydralazine, and torsemide.  His baseline creatinine was noted to be around 1.7.  He was also diagnosed with new onset atrial flutter and was maintaining sinus rhythm post DCCV.  He was placed on apixaban 5 mg twice daily.  He presents to the clinic today for follow-up evaluation states***  *** denies chest pain, shortness of breath, lower extremity edema, fatigue, palpitations, melena, hematuria, hemoptysis, diaphoresis, weakness, presyncope, syncope, orthopnea, and PND.   Home Medications    Prior to Admission medications   Medication  Sig Start Date End Date Taking? Authorizing Provider  amiodarone (PACERONE) 200 MG tablet Take 1 tablet (200 mg total) by mouth daily. 09/17/21 11/16/21  Hongalgi, Lenis Dickinson, MD  apixaban (ELIQUIS) 5 MG TABS tablet Take 1 tablet (5 mg total) by mouth 2 (two) times daily.  09/17/21 11/16/21  Hongalgi, Lenis Dickinson, MD  cefadroxil (DURICEF) 500 MG capsule Take 2 capsules (1,000 mg total) by mouth 2 (two) times daily for 14 days. 09/17/21 10/01/21  Hongalgi, Lenis Dickinson, MD  Ensure Plus (ENSURE PLUS) LIQD Take 237 mLs by mouth 2 (two) times daily between meals.    [provider]  finasteride (PROSCAR) 5 MG tablet Take 1 tablet (5 mg total) by mouth daily. 09/17/21 11/16/21  Hongalgi, Lenis Dickinson, MD  gabapentin (NEURONTIN) 400 MG capsule Take 1 capsule (400 mg total) by mouth 3 (three) times daily. 09/17/21 11/16/21  Hongalgi, Lenis Dickinson, MD  HYDROmorphone (DILAUDID) 4 MG tablet Take 1 tablet (4 mg total) by mouth every 6 (six) hours as needed for severe pain. 09/17/21   Hongalgi, Lenis Dickinson, MD  insulin aspart (NOVOLOG) 100 UNIT/ML injection Inject 6 Units into the skin 3 (three) times daily with meals. 09/17/21   Hongalgi, Lenis Dickinson, MD  insulin glargine (LANTUS SOLOSTAR) 100 UNIT/ML Solostar Pen Inject 36 Units into the skin 2 (two) times daily. 09/17/21   Hongalgi, Lenis Dickinson, MD  Insulin Pen Needle 32G X 4 MM MISC Use as directed 09/17/21   Modena Jansky, MD  metoprolol succinate (TOPROL-XL) 25 MG 24 hr tablet Take 1 tablet (25 mg total) by mouth daily. 09/17/21 11/16/21  Modena Jansky, MD  Multiple Vitamin (MULTIVITAMIN WITH MINERALS) TABS tablet Take 1 tablet by mouth daily. 09/10/21 10/10/21  Vashti Hey, MD  oxyCODONE (OXYCONTIN) 30 MG 12 hr tablet Take 1 tablet (30 mg total) by mouth 2 (two) times daily for 7 days. 09/23/21 09/30/21  Hongalgi, Lenis Dickinson, MD  tamsulosin (FLOMAX) 0.4 MG CAPS capsule Take 1 capsule (0.4 mg total) by mouth daily. 09/17/21 11/16/21  Hongalgi, Lenis Dickinson, MD  testosterone (ANDROGEL) 50 MG/5GM (1%) GEL Place 5 g onto the skin daily. 09/17/21 10/17/21  Hongalgi, Lenis Dickinson, MD  traZODone (DESYREL) 150 MG tablet Take 1 tablet (150 mg total) by mouth at bedtime as needed for sleep. 09/17/21   Hongalgi, Lenis Dickinson, MD    Family History    Family History  Problem  Relation Age of Onset   Anxiety disorder Mother    Cancer Father    Coronary artery disease Father    He indicated that his mother is alive. He indicated that his father is deceased.  Social History    Social History   Socioeconomic History   Marital status: Divorced    Spouse name: Not on file   Number of children: Not on file   Years of education: Not on file   Highest education level: Not on file  Occupational History   Not on file  Tobacco Use   Smoking status: Never   Smokeless tobacco: Never  Vaping Use   Vaping Use: Never used  Substance and Sexual Activity   Alcohol use: Never   Drug use: Never   Sexual activity: Yes  Other Topics Concern   Not on file  Social History Narrative   Not on file   Social Determinants of Health   Financial Resource Strain: Not on file  Food Insecurity: Not on file  Transportation Needs: Not on file  Physical Activity:  Not on file  Stress: Not on file  Social Connections: Not on file  Intimate Partner Violence: Not on file     Review of Systems    General:  No chills, fever, night sweats or weight changes.  Cardiovascular:  No chest pain, dyspnea on exertion, edema, orthopnea, palpitations, paroxysmal nocturnal dyspnea. Dermatological: No rash, lesions/masses Respiratory: No cough, dyspnea Urologic: No hematuria, dysuria Abdominal:   No nausea, vomiting, diarrhea, bright red blood per rectum, melena, or hematemesis Neurologic:  No visual changes, wkns, changes in mental status. All other systems reviewed and are otherwise negative except as noted above.  Physical Exam    VS:  There were no vitals taken for this visit. , BMI There is no height or weight on file to calculate BMI. GEN: Well nourished, well developed, in no acute distress. HEENT: normal. Neck: Supple, no JVD, carotid bruits, or masses. Cardiac: RRR, no murmurs, rubs, or gallops. No clubbing, cyanosis, edema.  Radials/DP/PT 2+ and equal bilaterally.   Respiratory:  Respirations regular and unlabored, clear to auscultation bilaterally. GI: Soft, nontender, nondistended, BS + x 4. MS: no deformity or atrophy. Skin: warm and dry, no rash. Neuro:  Strength and sensation are intact. Psych: Normal affect.  Accessory Clinical Findings    Recent Labs: 07/27/2021: B Natriuretic Peptide 1,368.1 08/15/2021: Magnesium 1.5 09/16/2021: ALT 54 09/17/2021: BUN 18; Creatinine, Ser 1.16; Hemoglobin 11.3; Platelets 359; Potassium 4.1; Sodium 134   Recent Lipid Panel No results found for: CHOL, TRIG, HDL, CHOLHDL, VLDL, LDLCALC, LDLDIRECT  ECG personally reviewed by me today- *** - No acute changes  Echocardiogram TEE 07/29/2021 IMPRESSIONS     1. Left ventricular ejection fraction, by estimation, is 35 to 40%. The  left ventricle has moderately decreased function. The left ventricle  demonstrates global hypokinesis.   2. Right ventricular systolic function is moderately reduced. The right  ventricular size is normal.   3. Left atrial size was mildly dilated. No left atrial/left atrial  appendage thrombus was detected.   4. Right atrial size was mildly dilated.   5. The mitral valve is normal in structure. Trivial mitral valve  regurgitation. No evidence of mitral stenosis.   6. The aortic valve is normal in structure. Aortic valve regurgitation is  trivial. No aortic stenosis is present.   7. The inferior vena cava is normal in size with greater than 50%  respiratory variability, suggesting right atrial pressure of 3 mmHg.   8. Cannot exclude a small PFO.   Conclusion(s)/Recommendation(s): Normal biventricular function without  evidence of hemodynamically significant valvular heart disease.  Assessment & Plan   1.  Atrial flutter-EKG today shows***.  Status post TEE/DCCV 07/29/2021.  Denies episodes of irregular or accelerated heartbeat.  Reports compliance with apixaban.  Denies bleeding issues. This patients CHA2DS2-VASc Score and  unadjusted Ischemic Stroke Rate (% per year) is equal to 3.2 % stroke rate/year from a score of 3 [CHF, HTN, DM,  Continue apixaban, metoprolol, amiodarone Heart healthy low-sodium diet Increase physical activity as tolerated Avoid triggers caffeine, chocolate, EtOH, dehydration etc.  Chronic systolic and diastolic CHF-no increased DOE or activity intolerance.  Euvolemic.  Echocardiogram showed EF 35-40%, mildly dilated left atria.  Not a candidate for ACE ARB Arni due to CKD. Continue  metoprolol Heart healthy low-sodium diet-salty 6 given Increase physical activity as tolerated Daily weights-contact office with any increase of 3 pounds overnight or 5 pounds in 1 week.  CKD stage IIIa-creatinine 1.16 on 09/17/2021. Follows with PCP  Disposition: Follow-up  with Dr. Sallyanne Kuster  in 3-4 months.    Jossie Ng. Anjelina Dung NP-C    09/29/2021, 1:37 PM [facility] [address] Office [phone] Fax [phone]  Notice: This dictation was prepared with Dragon dictation along with smaller phrase technology. Any transcriptional errors that result from this process are unintentional and may not be corrected upon review.  I spent***minutes examining this patient, reviewing medications, and using patient centered shared decision making involving her cardiac care.  Prior to her visit I spent greater than 20 minutes reviewing her past medical history,  medications, and prior cardiac tests.

## 2021-09-30 ENCOUNTER — Emergency Department (HOSPITAL_COMMUNITY): Payer: Medicare HMO

## 2021-09-30 ENCOUNTER — Other Ambulatory Visit: Payer: Self-pay

## 2021-09-30 ENCOUNTER — Emergency Department (HOSPITAL_COMMUNITY)
Admission: EM | Admit: 2021-09-30 | Discharge: 2021-10-02 | Disposition: A | Discharge status: Home / Self Care | Payer: Medicare HMO | Attending: Emergency Medicine | Admitting: Emergency Medicine

## 2021-09-30 DIAGNOSIS — R2689 Other abnormalities of gait and mobility: Secondary | ICD-10-CM | POA: Diagnosis not present

## 2021-09-30 DIAGNOSIS — E1122 Type 2 diabetes mellitus with diabetic chronic kidney disease: Secondary | ICD-10-CM | POA: Insufficient documentation

## 2021-09-30 DIAGNOSIS — L89159 Pressure ulcer of sacral region, unspecified stage: Secondary | ICD-10-CM | POA: Diagnosis not present

## 2021-09-30 DIAGNOSIS — N1831 Chronic kidney disease, stage 3a: Secondary | ICD-10-CM | POA: Diagnosis not present

## 2021-09-30 DIAGNOSIS — E1165 Type 2 diabetes mellitus with hyperglycemia: Secondary | ICD-10-CM | POA: Diagnosis not present

## 2021-09-30 DIAGNOSIS — N189 Chronic kidney disease, unspecified: Secondary | ICD-10-CM | POA: Diagnosis not present

## 2021-09-30 DIAGNOSIS — R9431 Abnormal electrocardiogram [ECG] [EKG]: Secondary | ICD-10-CM | POA: Diagnosis not present

## 2021-09-30 DIAGNOSIS — L89150 Pressure ulcer of sacral region, unstageable: Secondary | ICD-10-CM

## 2021-09-30 DIAGNOSIS — Z79891 Long term (current) use of opiate analgesic: Secondary | ICD-10-CM | POA: Diagnosis not present

## 2021-09-30 DIAGNOSIS — R262 Difficulty in walking, not elsewhere classified: Secondary | ICD-10-CM | POA: Diagnosis not present

## 2021-09-30 DIAGNOSIS — I5022 Chronic systolic (congestive) heart failure: Secondary | ICD-10-CM | POA: Diagnosis not present

## 2021-09-30 DIAGNOSIS — L89154 Pressure ulcer of sacral region, stage 4: Secondary | ICD-10-CM | POA: Diagnosis not present

## 2021-09-30 DIAGNOSIS — M47817 Spondylosis without myelopathy or radiculopathy, lumbosacral region: Secondary | ICD-10-CM | POA: Diagnosis not present

## 2021-09-30 DIAGNOSIS — N3289 Other specified disorders of bladder: Secondary | ICD-10-CM | POA: Insufficient documentation

## 2021-09-30 DIAGNOSIS — M4628 Osteomyelitis of vertebra, sacral and sacrococcygeal region: Secondary | ICD-10-CM

## 2021-09-30 DIAGNOSIS — E1142 Type 2 diabetes mellitus with diabetic polyneuropathy: Secondary | ICD-10-CM | POA: Diagnosis not present

## 2021-09-30 DIAGNOSIS — Z20822 Contact with and (suspected) exposure to covid-19: Secondary | ICD-10-CM | POA: Insufficient documentation

## 2021-09-30 DIAGNOSIS — R739 Hyperglycemia, unspecified: Secondary | ICD-10-CM

## 2021-09-30 DIAGNOSIS — L98499 Non-pressure chronic ulcer of skin of other sites with unspecified severity: Secondary | ICD-10-CM | POA: Diagnosis not present

## 2021-09-30 LAB — BASIC METABOLIC PANEL
Anion gap: 12 (ref 5–15)
BUN: 28 mg/dL — ABNORMAL HIGH (ref 6–20)
CO2: 23 mmol/L (ref 22–32)
Calcium: 8.3 mg/dL — ABNORMAL LOW (ref 8.9–10.3)
Chloride: 90 mmol/L — ABNORMAL LOW (ref 98–111)
Creatinine, Ser: 1.11 mg/dL (ref 0.61–1.24)
GFR, Estimated: >60 mL/min (ref >60)
Glucose, Bld: 657 mg/dL (ref 70–99)
Potassium: 4 mmol/L (ref 3.5–5.1)
Sodium: 125 mmol/L — ABNORMAL LOW (ref 135–145)

## 2021-09-30 LAB — URINALYSIS, ROUTINE W REFLEX MICROSCOPIC
Bilirubin Urine: NEGATIVE
Glucose, UA: >=500 mg/dL — AB
Ketones, ur: NEGATIVE mg/dL
Nitrite: NEGATIVE
Protein, ur: NEGATIVE mg/dL
Specific Gravity, Urine: 1.021 (ref 1.005–1.030)
pH: 6 (ref 5.0–8.0)

## 2021-09-30 LAB — CBC WITH DIFFERENTIAL/PLATELET
Abs Immature Granulocytes: 0.14 10*3/uL — ABNORMAL HIGH (ref 0.00–0.07)
Basophils Absolute: 0 10*3/uL (ref 0.0–0.1)
Basophils Relative: 0 %
Eosinophils Absolute: 0.1 10*3/uL (ref 0.0–0.5)
Eosinophils Relative: 1 %
HCT: 38.4 % — ABNORMAL LOW (ref 39.0–52.0)
Hemoglobin: 12.6 g/dL — ABNORMAL LOW (ref 13.0–17.0)
Immature Granulocytes: 1 %
Lymphocytes Relative: 4 %
Lymphs Abs: 0.5 10*3/uL — ABNORMAL LOW (ref 0.7–4.0)
MCH: 28.8 pg (ref 26.0–34.0)
MCHC: 32.8 g/dL (ref 30.0–36.0)
MCV: 87.7 fL (ref 80.0–100.0)
Monocytes Absolute: 0.5 10*3/uL (ref 0.1–1.0)
Monocytes Relative: 5 %
Neutro Abs: 10.2 10*3/uL — ABNORMAL HIGH (ref 1.7–7.7)
Neutrophils Relative %: 89 %
Platelets: 381 10*3/uL (ref 150–400)
RBC: 4.38 MIL/uL (ref 4.22–5.81)
RDW: 13.5 % (ref 11.5–15.5)
WBC: 11.5 10*3/uL — ABNORMAL HIGH (ref 4.0–10.5)
nRBC: 0 % (ref 0.0–0.2)

## 2021-09-30 LAB — LACTIC ACID, PLASMA
Lactic Acid, Venous: 2 mmol/L (ref 0.5–1.9)
Lactic Acid, Venous: 2.5 mmol/L (ref 0.5–1.9)

## 2021-09-30 LAB — CBG MONITORING, ED: Glucose-Capillary: 424 mg/dL — ABNORMAL HIGH (ref 70–99)

## 2021-09-30 IMAGING — CT CT PELVIS W/ CM
2 of 3 series · 13 of 46 positions shown, 15 images · IV contrast (APPLIED)
Comparison: CT dated [DATE].

CLINICAL DATA: Soft tissue infection.  Decubitus ulcer.

EXAM:
CT PELVIS WITH CONTRAST
TECHNIQUE: Multidetector CT imaging of the pelvis was performed using the
standard protocol following the bolus administration of intravenous
contrast.

[Series 6: soft tissue · axial · 0.58mm/px · z∈[+559,+827]mm · 10 of 154 slices shown, 12 images]
[im 10/154  soft-tissue]
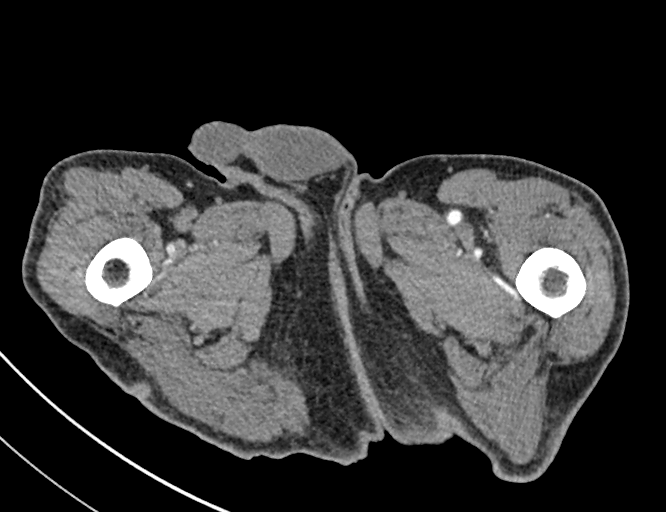
[im 10/154  bone]
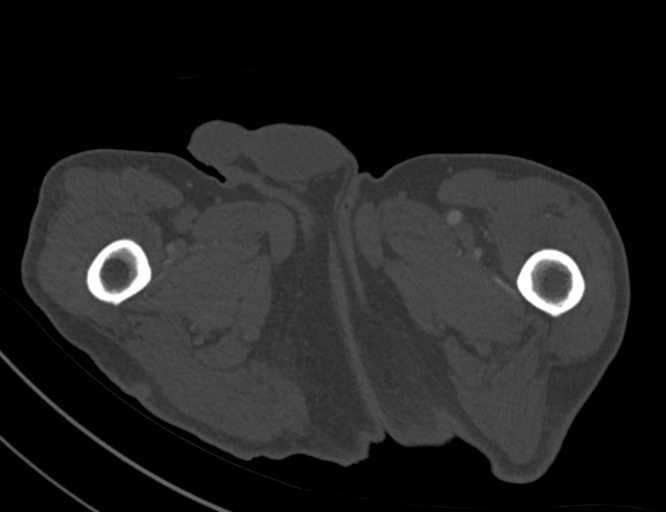
[im 25/154  soft-tissue]
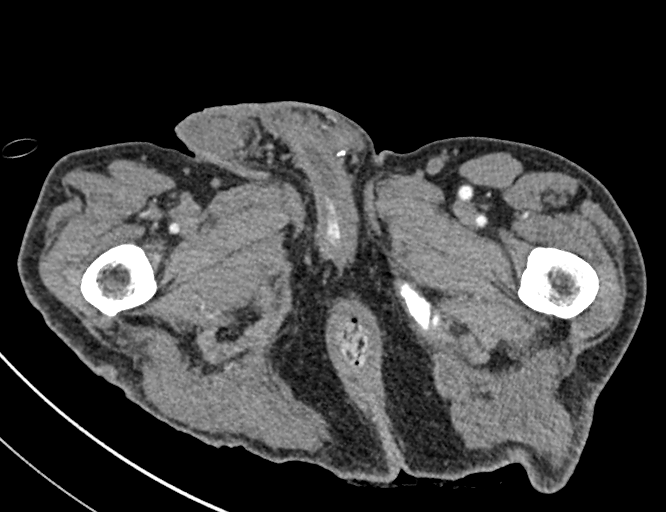
[im 40/154  soft-tissue]
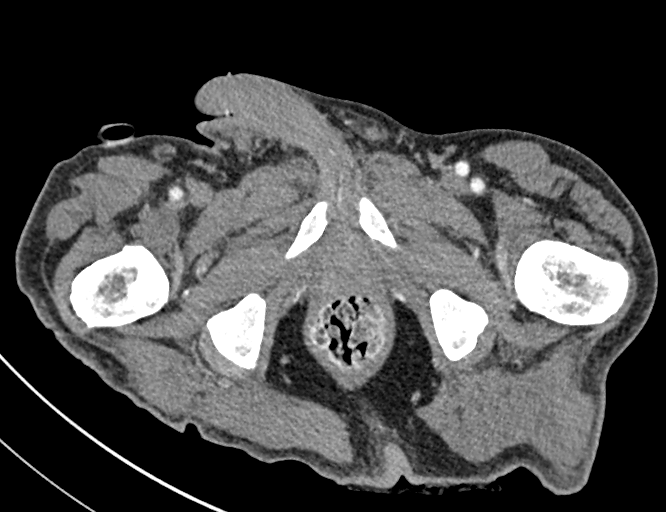
[im 55/154  soft-tissue]
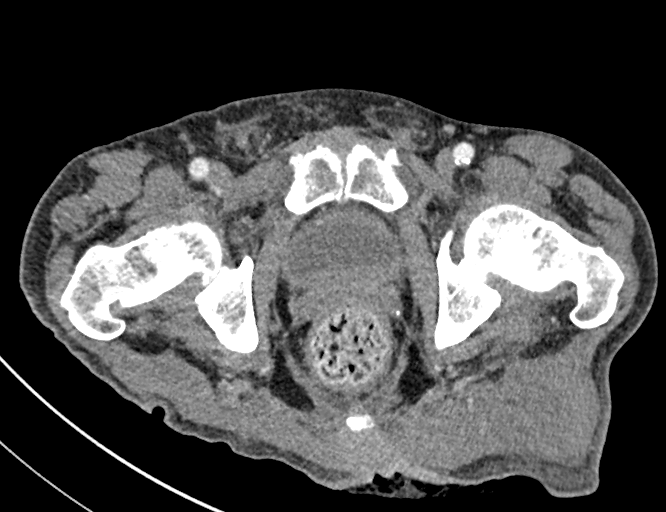
[im 70/154  soft-tissue]
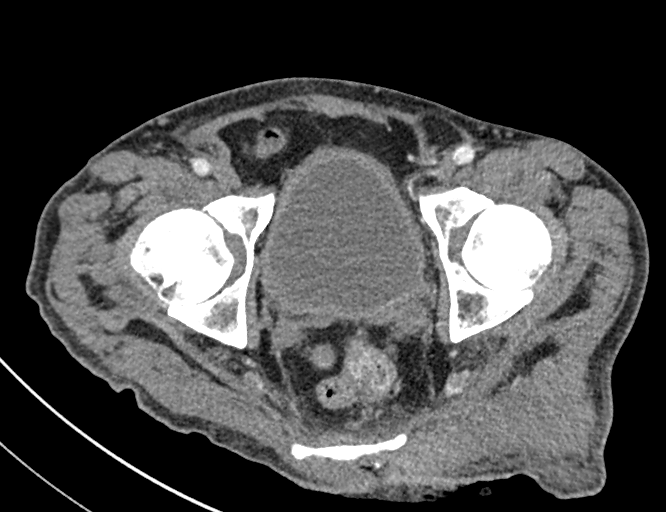
[im 84/154  soft-tissue]
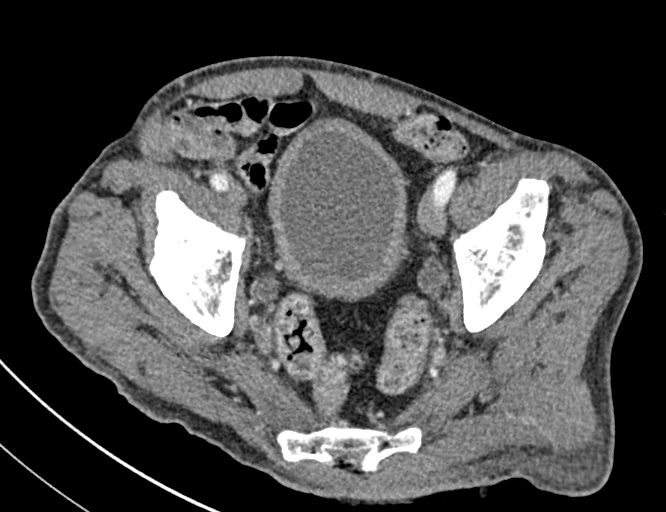
[im 99/154  soft-tissue]
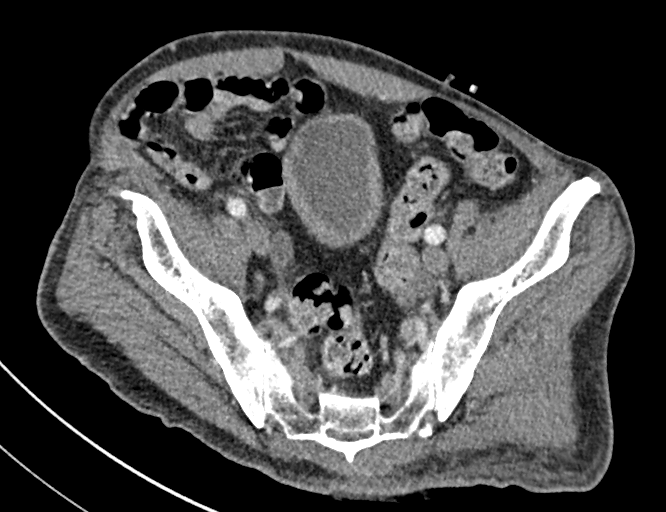
[im 114/154  soft-tissue]
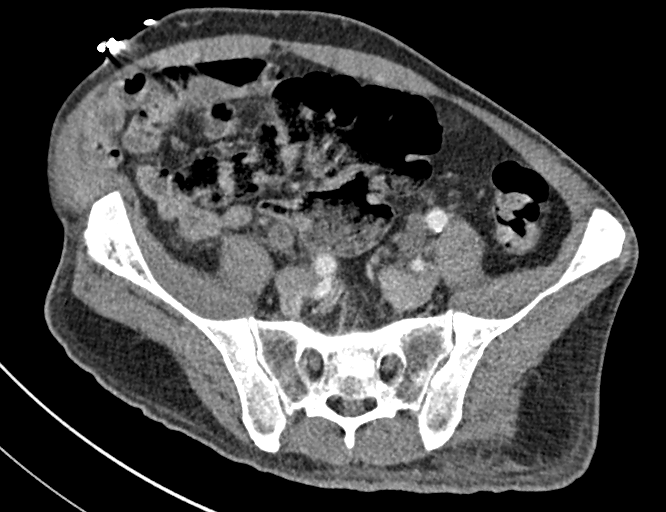
[im 129/154  soft-tissue]
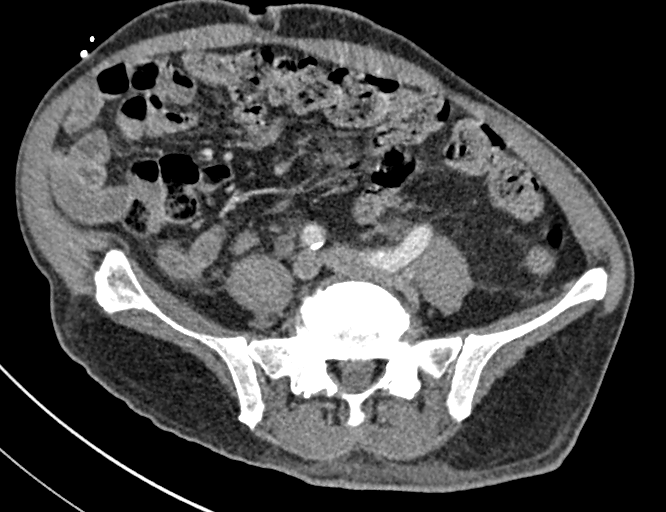
[im 129/154  bone]
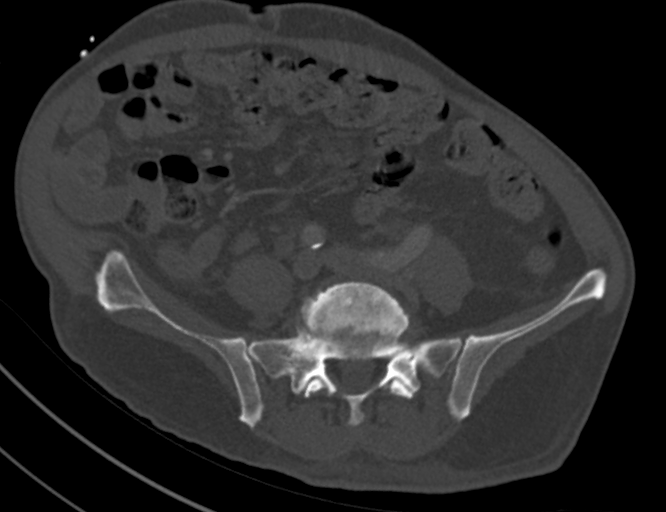
[im 144/154  soft-tissue]
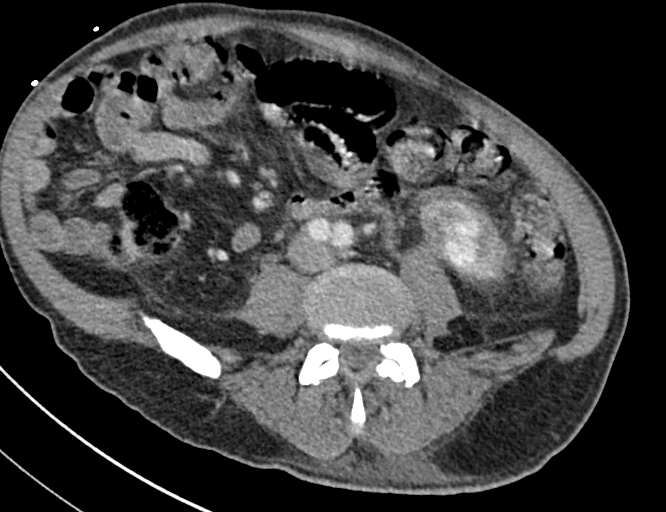

[Series 7: cor soft · coronal · 0.60mm/px · 3 of 148 slices shown]
[im 50/148  soft-tissue]
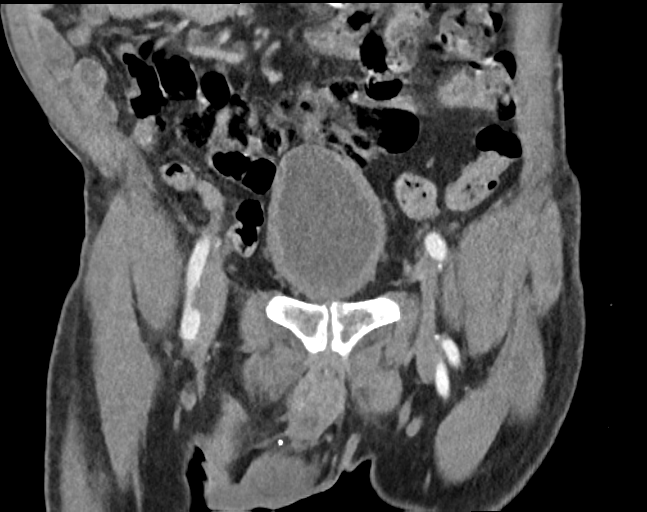
[im 66/148  soft-tissue]
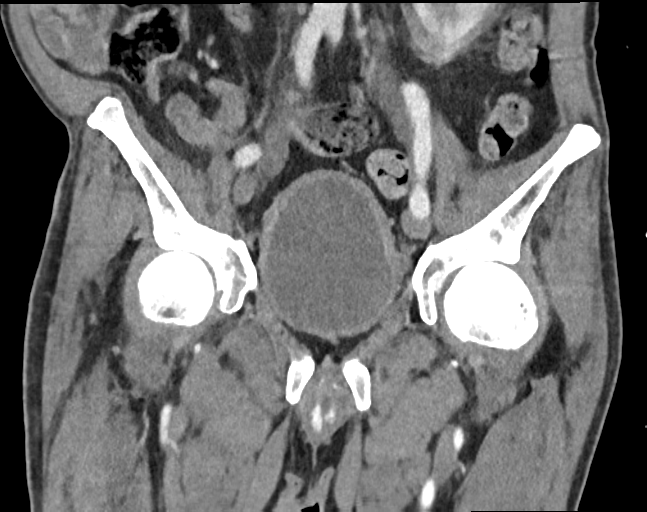
[im 82/148  soft-tissue]
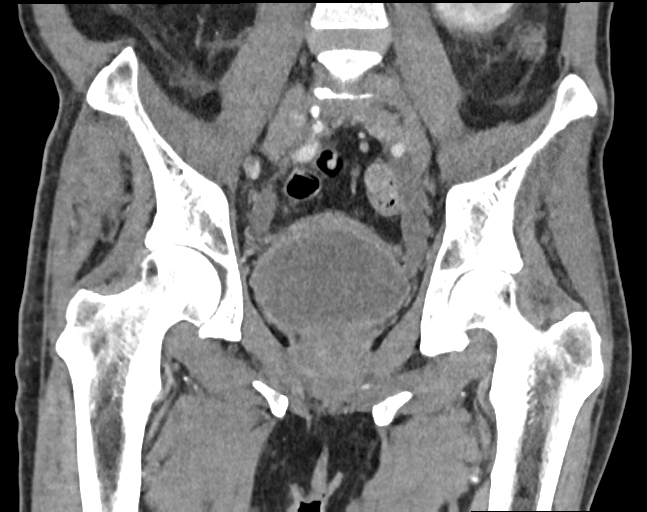

[13 of 46 positions shown; findings below may reference images not displayed]

RADIATION DOSE REDUCTION: This exam was performed according to the
departmental dose-optimization program which includes automated
exposure control, adjustment of the mA and/or kV according to
patient size and/or use of iterative reconstruction technique.

CONTRAST:  100mL OMNIPAQUE IOHEXOL 300 MG/ML  SOLN
FINDINGS: Urinary Tract: There is dilatation of the visualized ureters
bilaterally. There is diffusely thickened and trabeculated bladder
wall which may be related to chronic bladder outlet obstruction.
Correlation with urinalysis recommended to exclude cystitis.

Bowel: Moderate stool throughout the colon. No bowel dilatation in
the pelvis. The appendix is unremarkable.

Vascular/Lymphatic: Mild aortoiliac atherosclerotic disease. The IVC
is unremarkable. No pelvic adenopathy.

Reproductive: The prostate and seminal vesicles are grossly
unremarkable. Dilated appearance of the urethra.

Other: There is ulceration of the skin and soft tissues superficial
to the distal sacrum and coccyx. No drainable fluid
collection/abscess.

Musculoskeletal: Fragmentation and sclerotic changes of the distal
sacrum and coccyx most consistent with osteomyelitis. Clinical
correlation is recommended. Degenerative changes at L5-S1.
IMPRESSION: 1. Ulceration of the skin and soft tissues superficial to the distal
sacrum and coccyx with findings most consistent with osteomyelitis
of the distal sacrum and coccyx. No drainable fluid
collection/abscess.
2. Diffusely thickened and trabeculated bladder wall may be related
to chronic bladder outlet obstruction. Correlation with urinalysis
recommended to exclude cystitis.
3. Aortic Atherosclerosis ([F7]-[F7]).

## 2021-09-30 MED ORDER — APIXABAN 5 MG PO TABS
5.0000 mg | ORAL_TABLET | Freq: Two times a day (BID) | ORAL | Status: DC
  Administered 2021-10-01 – 2021-10-02 (×4): 5 mg via ORAL
  Filled 2021-09-30 (×4): qty 1

## 2021-09-30 MED ORDER — HYDROMORPHONE HCL 2 MG PO TABS
4.0000 mg | ORAL_TABLET | Freq: Four times a day (QID) | ORAL | Status: DC | PRN
  Administered 2021-10-01 – 2021-10-02 (×6): 4 mg via ORAL
  Filled 2021-09-30 (×6): qty 2

## 2021-09-30 MED ORDER — INSULIN ASPART 100 UNIT/ML IJ SOLN
6.0000 [IU] | Freq: Three times a day (TID) | INTRAMUSCULAR | Status: DC
  Administered 2021-10-01 – 2021-10-02 (×6): 6 [IU] via SUBCUTANEOUS

## 2021-09-30 MED ORDER — METRONIDAZOLE 500 MG/100ML IV SOLN
500.0000 mg | Freq: Two times a day (BID) | INTRAVENOUS | Status: DC
  Administered 2021-09-30 – 2021-10-01 (×2): 500 mg via INTRAVENOUS
  Filled 2021-09-30 (×2): qty 100

## 2021-09-30 MED ORDER — CEFADROXIL 500 MG PO CAPS: 1000.0000 mg | ORAL_CAPSULE | Freq: Two times a day (BID) | ORAL | Status: DC

## 2021-09-30 MED ORDER — AMIODARONE HCL 200 MG PO TABS
200.0000 mg | ORAL_TABLET | Freq: Every day | ORAL | Status: DC
  Administered 2021-10-01 – 2021-10-02 (×2): 200 mg via ORAL
  Filled 2021-09-30 (×2): qty 1

## 2021-09-30 MED ORDER — INSULIN GLARGINE-YFGN 100 UNIT/ML ~~LOC~~ SOLN
36.0000 [IU] | Freq: Two times a day (BID) | SUBCUTANEOUS | Status: DC
  Administered 2021-10-01 – 2021-10-02 (×3): 36 [IU] via SUBCUTANEOUS
  Filled 2021-09-30 (×7): qty 0.36

## 2021-09-30 MED ORDER — FINASTERIDE 5 MG PO TABS
5.0000 mg | ORAL_TABLET | Freq: Every day | ORAL | Status: DC
  Administered 2021-10-01 – 2021-10-02 (×2): 5 mg via ORAL
  Filled 2021-09-30 (×4): qty 1

## 2021-09-30 MED ORDER — VANCOMYCIN HCL 2000 MG/400ML IV SOLN
2000.0000 mg | Freq: Once | INTRAVENOUS | Status: AC
  Administered 2021-09-30: 21:00:00 2000 mg via INTRAVENOUS
  Filled 2021-09-30: qty 400

## 2021-09-30 MED ORDER — INSULIN ASPART 100 UNIT/ML IJ SOLN
12.0000 [IU] | Freq: Once | INTRAMUSCULAR | Status: AC
  Administered 2021-09-30: 23:00:00 12 [IU] via SUBCUTANEOUS

## 2021-09-30 MED ORDER — OXYCODONE-ACETAMINOPHEN 5-325 MG PO TABS
1.0000 | ORAL_TABLET | Freq: Once | ORAL | Status: AC
  Administered 2021-09-30: 14:00:00 1 via ORAL
  Filled 2021-09-30: qty 1

## 2021-09-30 MED ORDER — METOPROLOL SUCCINATE ER 25 MG PO TB24
25.0000 mg | ORAL_TABLET | Freq: Every day | ORAL | Status: DC
  Administered 2021-10-01 – 2021-10-02 (×2): 25 mg via ORAL
  Filled 2021-09-30 (×2): qty 1

## 2021-09-30 MED ORDER — GABAPENTIN 400 MG PO CAPS
400.0000 mg | ORAL_CAPSULE | Freq: Three times a day (TID) | ORAL | Status: DC
  Administered 2021-10-01 – 2021-10-02 (×6): 400 mg via ORAL
  Filled 2021-09-30 (×6): qty 1

## 2021-09-30 MED ORDER — IOHEXOL 300 MG/ML  SOLN
100.0000 mL | Freq: Once | INTRAMUSCULAR | Status: AC | PRN
  Administered 2021-09-30: 19:00:00 100 mL via INTRAVENOUS

## 2021-09-30 MED ORDER — TRAZODONE HCL 50 MG PO TABS
150.0000 mg | ORAL_TABLET | Freq: Every evening | ORAL | Status: DC | PRN
  Administered 2021-10-01 (×2): 150 mg via ORAL
  Filled 2021-09-30: qty 1
  Filled 2021-09-30: qty 3

## 2021-09-30 MED ORDER — LACTATED RINGERS IV BOLUS
2000.0000 mL | Freq: Once | INTRAVENOUS | Status: AC
  Administered 2021-09-30: 17:00:00 2000 mL via INTRAVENOUS

## 2021-09-30 MED ORDER — ADULT MULTIVITAMIN W/MINERALS CH
1.0000 | ORAL_TABLET | Freq: Every day | ORAL | Status: DC
  Administered 2021-10-01 – 2021-10-02 (×2): 1 via ORAL
  Filled 2021-09-30 (×2): qty 1

## 2021-09-30 MED ORDER — HYDROMORPHONE HCL 1 MG/ML IJ SOLN
1.0000 mg | INTRAMUSCULAR | Status: AC | PRN
  Administered 2021-09-30 (×3): 1 mg via INTRAVENOUS
  Filled 2021-09-30 (×3): qty 1

## 2021-09-30 MED ORDER — VANCOMYCIN HCL 1500 MG/300ML IV SOLN
1500.0000 mg | Freq: Two times a day (BID) | INTRAVENOUS | Status: DC
  Filled 2021-09-30: qty 300

## 2021-09-30 MED ORDER — TAMSULOSIN HCL 0.4 MG PO CAPS
0.4000 mg | ORAL_CAPSULE | Freq: Every day | ORAL | Status: DC
  Administered 2021-10-01 – 2021-10-02 (×2): 0.4 mg via ORAL
  Filled 2021-09-30 (×4): qty 1

## 2021-09-30 MED ORDER — SODIUM CHLORIDE 0.9 % IV SOLN
2.0000 g | Freq: Three times a day (TID) | INTRAVENOUS | Status: DC
  Administered 2021-09-30 – 2021-10-01 (×2): 2 g via INTRAVENOUS
  Filled 2021-09-30 (×4): qty 2

## 2021-09-30 MED ORDER — INSULIN GLARGINE 100 UNIT/ML SOLOSTAR PEN: 36.0000 [IU] | PEN_INJECTOR | Freq: Two times a day (BID) | SUBCUTANEOUS | Status: DC

## 2021-09-30 NOTE — ED Notes (Signed)
Pt requesting to see urologist. RN made aware.

## 2021-09-30 NOTE — Consult Note (Signed)
Surgical Evaluation Requesting provider: Alyse Low PA-C  Chief Complaint: wound  HPI: 56 year old with multiple issues who has been in and out of the hospital frequently over the last couple of months due to bacteremia with thoracic discitis/osteomyelitis with epidural abscess in the setting of multiple other medical problems including hypertension, stage IIIa chronic kidney disease, type 2 diabetes, bilateral BKA, atrial flutter, chronic systolic heart failure, and chronic pain and was just recently discharged, (per discharge summary patient declined available SNF and was denied for CIR) returns with worsening decubitus wound, which was stage IV on recent discharge.  Patient reports urinary incontinence due to damage to his spine from chronic osteomyelitis, and due to this the area stays somewhat damp.  His significant other who is at the bedside has been helping with dressing changes.  No significant pain to the area.  Of note his blood sugars are significantly elevated on presentation but the patient tells me that he ate a bunch of candy in the waiting room of the ER today. Surgery was consulted regarding wound management.  Allergies  Allergen Reactions   Bactrim [Sulfamethoxazole-Trimethoprim]    Ceprotin [Protein C Concentrate (Human)]    Ciprofloxacin Other (See Comments)    Kidney function   Levaquin [Levofloxacin]     Past Medical History:  Diagnosis Date   Chronic diastolic CHF (congestive heart failure) ([facility])    Complication of anesthesia    Depression    Diabetes mellitus without complication ([place])    Hypertension     Past Surgical History:  Procedure Laterality Date   AMPUTATION Left 07/23/2021   Procedure: LEFT BELOW KNEE AMPUTATION;  Surgeon: [name], MD;  Location: [place];  Service: Orthopedics;  Laterality: Left;   BELOW KNEE LEG AMPUTATION Right    BUBBLE STUDY  07/29/2021   Procedure: BUBBLE STUDY;  Surgeon: Sueanne Margarita, MD;  Location: [place];   Service: Cardiovascular;;   CARDIOVERSION N/A 07/29/2021   Procedure: CARDIOVERSION;  Surgeon: Sueanne Margarita, MD;  Location: [facility] ENDOSCOPY;  Service: Cardiovascular;  Laterality: N/A;   RADIOLOGY WITH ANESTHESIA N/A 08/05/2021   Procedure: MRI LUMBAR WITH AND WITHOUT; THORASIC SPINE WITH AND WITHOUT WITH ANESTHESIA;  Surgeon: Radiologist, Medication, MD;  Location: [place];  Service: Radiology;  Laterality: N/A;   RADIOLOGY WITH ANESTHESIA N/A 08/07/2021   Procedure: MRI WITH LUMBER WITH AND WITHOUT CONTRAST,THORACIC WITH AND WITHOUT CONTRAST;  Surgeon: Radiologist, Medication, MD;  Location: [place];  Service: Radiology;  Laterality: N/A;   RADIOLOGY WITH ANESTHESIA N/A 09/13/2021   Procedure: MRI WITH ANESTHESIA;  Surgeon: Luanne Bras, MD;  Location: [place];  Service: Radiology;  Laterality: N/A;   TEE WITHOUT CARDIOVERSION N/A 07/29/2021   Procedure: TRANSESOPHAGEAL ECHOCARDIOGRAM (TEE);  Surgeon: Sueanne Margarita, MD;  Location: [facility] ENDOSCOPY;  Service: Cardiovascular;  Laterality: N/A;    Family History  Problem Relation Age of Onset   Anxiety disorder Mother    Cancer Father    Coronary artery disease Father     Social History   Socioeconomic History   Marital status: Divorced    Spouse name: Not on file   Number of children: Not on file   Years of education: Not on file   Highest education level: Not on file  Occupational History   Not on file  Tobacco Use   Smoking status: Never   Smokeless tobacco: Never  Vaping Use   Vaping Use: Never used  Substance and Sexual Activity   Alcohol use: Never  Drug use: Never   Sexual activity: Yes  Other Topics Concern   Not on file  Social History Narrative   Not on file   Social Determinants of Health   Financial Resource Strain: Not on file  Food Insecurity: Not on file  Transportation Needs: Not on file  Physical Activity: Not on file  Stress: Not on file  Social Connections: Not on file    No current  facility-administered medications on file prior to encounter.   Current Outpatient Medications on File Prior to Encounter  Medication Sig Dispense Refill   amiodarone (PACERONE) 200 MG tablet Take 1 tablet (200 mg total) by mouth daily. 30 tablet 1   apixaban (ELIQUIS) 5 MG TABS tablet Take 1 tablet (5 mg total) by mouth 2 (two) times daily. 60 tablet 1   cefadroxil (DURICEF) 500 MG capsule Take 2 capsules (1,000 mg total) by mouth 2 (two) times daily for 14 days. 56 capsule 0   Ensure Plus (ENSURE PLUS) LIQD Take 237 mLs by mouth 2 (two) times daily between meals.     finasteride (PROSCAR) 5 MG tablet Take 1 tablet (5 mg total) by mouth daily. 30 tablet 1   gabapentin (NEURONTIN) 400 MG capsule Take 1 capsule (400 mg total) by mouth 3 (three) times daily. 90 capsule 1   HYDROmorphone (DILAUDID) 4 MG tablet Take 1 tablet (4 mg total) by mouth every 6 (six) hours as needed for severe pain. 30 tablet 0   insulin aspart (NOVOLOG) 100 UNIT/ML injection Inject 6 Units into the skin 3 (three) times daily with meals. 10 mL 0   insulin glargine (LANTUS SOLOSTAR) 100 UNIT/ML Solostar Pen Inject 36 Units into the skin 2 (two) times daily. 15 mL 1   Insulin Pen Needle 32G X 4 MM MISC Use as directed 100 each 1   metoprolol succinate (TOPROL-XL) 25 MG 24 hr tablet Take 1 tablet (25 mg total) by mouth daily. 30 tablet 1   Multiple Vitamin (MULTIVITAMIN WITH MINERALS) TABS tablet Take 1 tablet by mouth daily. 30 tablet 0   oxyCODONE (OXYCONTIN) 30 MG 12 hr tablet Take 1 tablet (30 mg total) by mouth 2 (two) times daily for 7 days. 14 tablet 0   tamsulosin (FLOMAX) 0.4 MG CAPS capsule Take 1 capsule (0.4 mg total) by mouth daily. 30 capsule 1   testosterone (ANDROGEL) 50 MG/5GM (1%) GEL Place 5 g onto the skin daily. 150 g 0   traZODone (DESYREL) 150 MG tablet Take 1 tablet (150 mg total) by mouth at bedtime as needed for sleep. 15 tablet 0    Review of Systems: a complete, 10pt review of systems was  completed with pertinent positives and negatives as documented in the HPI  Physical Exam: Vitals:   09/30/21 1705 09/30/21 1730  BP: 125/69 124/72  Pulse: 88 84  Resp: 19 20  Temp:    SpO2: 94% 92%   Gen: A&Ox3, no distress  Unlabored respirations next Decubitus wound in the sacral region approximately 4 cm in diameter with up to 2 cm of subcutaneous tunneling more on the right and cephalad aspects.  Wound bed is granulating with some elements of fibrinous exudate.  There is surrounding additional tissue damage suggested by nonblanching erythema approximately 5 to 10 cm radius.  No necrotic tissue or evidence of infection   CBC Latest Ref Rng & Units 09/30/2021 09/17/2021 09/16/2021  WBC 4.0 - 10.5 K/uL 11.5(H) 12.8(H) 11.1(H)  Hemoglobin 13.0 - 17.0 g/dL 12.6(L) 11.3(L) 11.1(L)  Hematocrit 39.0 - 52.0 % 38.4(L) 35.1(L) 33.6(L)  Platelets 150 - 400 K/uL 381 359 344    CMP Latest Ref Rng & Units 09/30/2021 09/17/2021 09/16/2021  Glucose 70 - 99 mg/dL 657(HH) 151(H) 285(H)  BUN 6 - 20 mg/dL 28(H) 18 25(H)  Creatinine 0.61 - 1.24 mg/dL 1.11 1.16 1.32(H)  Sodium 135 - 145 mmol/L 125(L) 134(L) 132(L)  Potassium 3.5 - 5.1 mmol/L 4.0 4.1 4.3  Chloride 98 - 111 mmol/L 90(L) 100 97(L)  CO2 22 - 32 mmol/L 23 27 28   Calcium 8.9 - 10.3 mg/dL 8.3(L) 8.8(L) 8.3(L)  Total Protein 6.5 - 8.1 g/dL - - 5.9(L)  Total Bilirubin 0.3 - 1.2 mg/dL - - 0.2(L)  Alkaline Phos 38 - 126 U/L - - 102  AST 15 - 41 U/L - - 25  ALT 0 - 44 U/L - - 54(H)    Lab Results  Component Value Date   INR 1.6 (H) 07/28/2021   INR 1.3 (H) 07/22/2021   INR 1.4 (H) 07/21/2021    Imaging: No results found.   A/P: Chronic decubitus ulcer, present on admission, stage IV It actually appears that they have taken great care of this wound all things considered.  No indication for surgical debridement at this time.  Recommend hydrotherapy and continued offloading as able.  Surgery will sign off.    Patient Active Problem  List   Diagnosis Date Noted   Stage IV pressure ulcer of sacral region ([place]) 09/15/2021   Insomnia 01/41/0301   Chronic systolic CHF (congestive heart failure) ([place]) 09/14/2021   Hypogonadism in male 09/14/2021   Chronic pain 09/14/2021   Back pain 09/13/2021   Leukocytosis 09/13/2021   Pseudohyponatremia 09/13/2021   Normocytic anemia 09/13/2021   Discitis of thoracic region    Recent bacteremia due to group B Streptococcus, Streptococcus agalactiae, thoracic discitis/osteomyelitis with epidural abscess    Sacral pressure injury of skin 31/43/8887   Systolic dysfunction    Severe sepsis with acute organ dysfunction ([place]) 07/22/2021   Chest pain 07/22/2021   AKI (acute kidney injury) ([place]) 07/22/2021   Gas gangrene of foot ([place]) 07/22/2021   Paroxysmal atrial flutter ([place]) 07/22/2021   Septic shock ([place]) 07/22/2021   Shortness of breath 05/16/2021   Chronic diastolic (congestive) heart failure ([place]) 05/16/2021   Diabetic infection of left foot ([place]) 04/17/2021   S/P bilateral BKA (below knee amputation) ([place]) 10/30/2019   Dyslipidemia 10/30/2019   Type 2 diabetes mellitus with diabetic polyneuropathy, with long-term current use of insulin ([place]) 10/12/2019   Type 2 diabetes mellitus 10/12/2019   Essential hypertension 09/25/2019   Type 2 diabetes mellitus with hyperglycemia, with long-term current use of insulin ([place]) 09/25/2019   Stage 3a chronic kidney disease ([place]) 09/25/2019   Hematuria 09/25/2019   Acquired complex renal cyst 09/25/2019   Urinary hesitancy 09/25/2019   Prosthesis adjustments 09/25/2019   Need for immunization against influenza 09/25/2019   Need for Tdap vaccination 09/25/2019   Erectile dysfunction 09/25/2019   Healthcare maintenance 09/25/2019   Pulmonary nodules 09/25/2019   Depression with anxiety 09/25/2019       Romana Juniper, MD [facility]  See AMION to contact appropriate on-call provider

## 2021-09-30 NOTE — ED Triage Notes (Signed)
Patient here with sepsis in November and lost both legs. Now has a bed sore and is incontinent of urine because of some damage to his spine as well. Unable to tell when he has to urinate so sore is staying wet. Constant pain as well

## 2021-09-30 NOTE — Progress Notes (Signed)
Pharmacy Antibiotic Note  [name] is a 56 y.o. male admitted on 09/30/2021 with  wound infection .  Pharmacy has been consulted for vancomycin and cefepime dosing.  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 Patient presenting with wound infection.  SCr 1.11 - at baseline WBC 11.5; LA 2; T 98.3 F  Plan: Metronidazole per MD Cefepime 2g q8hr Vancomycin 2000 mg once then 1500 mg q12hr (eAUC 483.5) unless change in renal function Trend WBC, Fever, Renal function, & Clinical course F/u cultures, clinical course, WBC, fever De-escalate when able Levels at steady state     Temp (24hrs), Avg:98.3 F (36.8 C), Min:98.3 F (36.8 C), Max:98.3 F (36.8 C)  Recent Labs  Lab 09/30/21 1422  WBC 11.5*  CREATININE 1.11  LATICACIDVEN 2.0*    CrCl cannot be calculated (Unknown ideal weight.).    Allergies  Allergen Reactions   Bactrim [Sulfamethoxazole-Trimethoprim]    Ceprotin [Protein C Concentrate (Human)]    Ciprofloxacin Other (See Comments)    Kidney function   Levaquin [Levofloxacin]     Antimicrobials this admission: cefepime 1/24 >>  vancomycin 1/24 >>  metronidazole 1/24 >>   Microbiology results: Pending  Thank you for allowing pharmacy to be a part of this patients care.  Lorelei Pont, PharmD, BCPS 09/30/2021 7:43 PM ED Clinical Pharmacist -  [phone]

## 2021-09-30 NOTE — ED Provider Triage Note (Signed)
Emergency Medicine Provider Triage Evaluation Note  [name] , a 56 y.o. male  was evaluated in triage.  Pt complains of severe pain around sacral ulcer that has been present for numerous months. He takes oxycodone with no relief. No fever or chills.   Review of Systems  Positive: wound Negative: fever  Physical Exam  BP 139/80    Pulse 90    Temp 98.3 F (36.8 C) (Oral)    Resp (!) 22    SpO2 98%  Gen:   Awake, no distress   Resp:  Normal effort  MSK:   Moves extremities without difficulty  Other:  Unable to assess sacral ulcer in triage  Medical Decision Making  Medically screening exam initiated at 2:16 PM.  Appropriate orders placed.  Conlan Miceli was informed that the remainder of the evaluation will be completed by another provider, this initial triage assessment does not replace that evaluation, and the importance of remaining in the ED until their evaluation is complete.  Labs to rule out systemic infection   Suzy Bouchard, [place] 09/30/21 1417

## 2021-09-30 NOTE — ED Notes (Signed)
Patient transported to CT 

## 2021-09-30 NOTE — Social Work (Signed)
CSW met with Pt at bedside. Pt is requesting SNF placement but will not accept a SN that is 1 or 2 stars. CSW explained SNF process and that Pt's referral will be sent via hub with insurance information and once bed offers are received Pt can choose between those bed offers.  If Pt does not feel that any bed offers are adequate, Pt may be discharged home with [facility].  Pt verbalizes understanding of process and thanked CSW for efforts. CSW axed out initial referral, still waiting for PT eval.  No Insurance Auth started as of this writing

## 2021-09-30 NOTE — ED Notes (Signed)
Pt's condom cath came off and pt was saturated in urine. RN and tech changed chux pads, linen was dry, replaced brief and applied new condom catheter. Pt respositioned in bed, call light within reach, water provided. Pt denies any other needs at this time, pt is resting in bed comfortably.

## 2021-09-30 NOTE — ED Provider Notes (Signed)
[facility] EMERGENCY DEPARTMENT Provider Note   CSN: [id number] Arrival date & time: 09/30/21  1309     History  Chief Complaint  Patient presents with   Skin Ulcer    [name] is a 56 y.o. male.  Pt sent here from wound care.  Pt was told he needs a wound vac and possible debridement of decubital ulcer.  Pt is diabetic  and glucose has been high.  Pt thinks it is from infection.  Pt thinks wound is getting worse because he is frequently in a wet diaper.  Pt complains of severe pain. Pt is on oxycontin at home without relief   The history is provided by the patient. No language interpreter was used.      Home Medications Lola Lofaro is a 56 y.o male s/p bilateral knee amputation with PMH significant for DM & CKD who presents to the ED c/o pain in the sacral region secondary to a stage IV pressure ulcer. Patient states he receives wound care twice weekly, however, states the ulcer seems to be worsening. States he has incontinence secondary to a spinal issues, thus states the wound often is soaked by his urine. States his brother helps change his dressings daily. States he has been talking to wound care about wound vac, however, states he needs debridement of the wound before pursuing that treatment option.      Allergies    Bactrim [sulfamethoxazole-trimethoprim], Ceprotin [protein c concentrate (human)], Ciprofloxacin, and Levaquin [levofloxacin]    Review of Systems   Review of Systems  Musculoskeletal:  Positive for back pain.  Skin:  Positive for wound.  All other systems reviewed and are negative.  Physical Exam Updated Vital Signs BP 139/80    Pulse 90    Temp 98.3 F (36.8 C) (Oral)    Resp (!) 22    SpO2 98%  Physical Exam Vitals and nursing note reviewed.  Constitutional:      Appearance: He is well-developed.  HENT:     Head: Normocephalic.     Mouth/Throat:     Mouth: Mucous membranes are moist.  Cardiovascular:     Rate and  Rhythm: Normal rate and regular rhythm.  Pulmonary:     Effort: Pulmonary effort is normal.  Abdominal:     General: There is no distension.  Musculoskeletal:     Cervical back: Normal range of motion.     Comments: Bilat lower extremity amputee,  below knee  Skin:    General: Skin is warm.     Comments: Deep decubitus ulcer sacrum   Neurological:     Mental Status: He is alert and oriented to person, place, and time.  Psychiatric:        Mood and Affect: Mood normal.    ED Results / Procedures / Treatments   Labs (all labs ordered are listed, but only abnormal results are displayed) Labs Reviewed  CBC WITH DIFFERENTIAL/PLATELET - Abnormal; Notable for the following components:      Result Value   WBC 11.5 (*)    Hemoglobin 12.6 (*)    HCT 38.4 (*)    Neutro Abs 10.2 (*)    Lymphs Abs 0.5 (*)    Abs Immature Granulocytes 0.14 (*)    All other components within normal limits  BASIC METABOLIC PANEL - Abnormal; Notable for the following components:   Sodium 125 (*)    Chloride 90 (*)    Glucose, Bld 657 (*)    BUN 28 (*)  Calcium 8.3 (*)    All other components within normal limits  LACTIC ACID, PLASMA - Abnormal; Notable for the following components:   Lactic Acid, Venous 2.0 (*)    All other components within normal limits  CULTURE, BLOOD (ROUTINE X 2)  CULTURE, BLOOD (ROUTINE X 2)  LACTIC ACID, PLASMA    EKG None  Radiology CT PELVIS W CONTRAST  Result Date: 09/30/2021 CLINICAL DATA:  Soft tissue infection.  Decubitus ulcer. EXAM: CT PELVIS WITH CONTRAST TECHNIQUE: Multidetector CT imaging of the pelvis was performed using the standard protocol following the bolus administration of intravenous contrast. RADIATION DOSE REDUCTION: This exam was performed according to the departmental dose-optimization program which includes automated exposure control, adjustment of the mA and/or kV according to patient size and/or use of iterative reconstruction technique. CONTRAST:   169mL OMNIPAQUE IOHEXOL 300 MG/ML  SOLN COMPARISON:  CT dated 11/08/2020. FINDINGS: Urinary Tract: There is dilatation of the visualized ureters bilaterally. There is diffusely thickened and trabeculated bladder wall which may be related to chronic bladder outlet obstruction. Correlation with urinalysis recommended to exclude cystitis. Bowel: Moderate stool throughout the colon. No bowel dilatation in the pelvis. The appendix is unremarkable. Vascular/Lymphatic: Mild aortoiliac atherosclerotic disease. The IVC is unremarkable. No pelvic adenopathy. Reproductive: The prostate and seminal vesicles are grossly unremarkable. Dilated appearance of the urethra. Other: There is ulceration of the skin and soft tissues superficial to the distal sacrum and coccyx. No drainable fluid collection/abscess. Musculoskeletal: Fragmentation and sclerotic changes of the distal sacrum and coccyx most consistent with osteomyelitis. Clinical correlation is recommended. Degenerative changes at L5-S1. IMPRESSION: 1. Ulceration of the skin and soft tissues superficial to the distal sacrum and coccyx with findings most consistent with osteomyelitis of the distal sacrum and coccyx. No drainable fluid collection/abscess. 2. Diffusely thickened and trabeculated bladder wall may be related to chronic bladder outlet obstruction. Correlation with urinalysis recommended to exclude cystitis. 3. Aortic Atherosclerosis (ICD10-I70.0). Electronically Signed   By: Anner Crete M.D.   On: 09/30/2021 19:49    Procedures Procedures    Medications Ordered in ED Medications  HYDROmorphone (DILAUDID) injection 1 mg (has no administration in time range)  lactated ringers bolus 2,000 mL (has no administration in time range)  oxyCODONE-acetaminophen (PERCOCET/ROXICET) 5-325 MG per tablet 1 tablet (1 tablet Oral Given 09/30/21 1422)    ED Course/ Medical Decision Making/ A&P                           Medical Decision Making Problems  Addressed: Acute osteomyelitis of coccyx [facility]): chronic illness or injury Hyperglycemia: acute illness or injury Pressure injury of sacral region, unstageable [facility]): chronic illness or injury  Amount and/or Complexity of Data Reviewed External Data Reviewed: notes.    Details: Hospitalist notes reviewed Labs: ordered. Decision-making details documented in ED Course.    Details: Pt has an elevated glucose of 657. sodium is 125 Radiology: ordered and independent interpretation performed. Decision-making details documented in ED Course.    Details: osteomylitis of sacrum and coccyx ECG/medicine tests: ordered and independent interpretation performed. Decision-making details documented in ED Course. Discussion of management or test interpretation with external provider(s): I spoke with Dr. Lonni Fix surgeon.  Surgery will consult  Hospitalist consulted for admission   Risk Prescription drug management. Parenteral controlled substances. Decision regarding hospitalization.        MDM:  I discussed with Dr. Kae Heller General surgery.  Surgery will consult for possible debriedment.  Dr.  Kae Heller saw pt and felt wound did not need debridement or wound vac.   I discussed with Hospitalist, Dr. Posey Pronto who advised pt does not require admission.   Pt refuses to go home.  Pt states he would only go a 5 star nursing home.  Pt is demanding admission I spoke with Dr. Alcario Drought hospitalist who advised sacral wound appear improved.  He advised pt has had 6 weeks of antibiotic treatment and is on oral antibiotic therapy.  Pt seems to have received maximum hospital care and at this point needs continued antibiotics and wound care.  Social work consulted to assess for placement. Pt consult ordered        Final Clinical Impression(s) / ED Diagnoses Final diagnoses:  Pressure injury of sacral region, unstageable ([place])  Osteomyelitis of sacrum ([place])  Acute osteomyelitis of coccyx ([place])  Hyperglycemia     Rx / DC Orders ED Discharge Orders     None      Oncoming provider made aware of pt.  Home medications, diet and cbg's ordered    Fransico Meadow, PA-C 09/30/21 2356    Horton, Alvin Critchley, DO 10/04/21 1546

## 2021-09-30 NOTE — Patient Outreach (Signed)
Received a hospital discharge referral for Mr. Carll. I have assigned Enzo Montgomery, RN to outreach and determine if there are any Case Management needs.   [place], [place], [place] Management [phone]

## 2021-09-30 NOTE — NC FL2 (Signed)
[place] LEVEL OF CARE SCREENING TOOL     IDENTIFICATION  Patient Name: [name] Birthdate: 1966-05-31 Sex: male Admission Date (Current Location): 09/30/2021  [facility] and [place] Number:  Herbalist and Address:  [facility] [place], [place] [address], [place], [place] [address]      Provider Number: [id number]  Attending Physician Name and Address:  Lorelle Gibbs, DO  Relative Name and Phone Number:  Amy, [place] (Son)   [phone]    Current Level of Care: Hospital Recommended Level of Care: [place] Prior Approval Number:    Date Approved/Denied:   PASRR Number: [id number]  Discharge Plan: SNF    Current Diagnoses: Patient Active Problem List   Diagnosis Date Noted   Stage IV pressure ulcer of sacral region ([place]) 09/15/2021   Insomnia 64/33/2951   Chronic systolic CHF (congestive heart failure) ([place]) 09/14/2021   Hypogonadism in male 09/14/2021   Chronic pain 09/14/2021   Back pain 09/13/2021   Leukocytosis 09/13/2021   Pseudohyponatremia 09/13/2021   Normocytic anemia 09/13/2021   Discitis of thoracic region    Recent bacteremia due to group B Streptococcus, Streptococcus agalactiae, thoracic discitis/osteomyelitis with epidural abscess    Sacral pressure injury of skin 88/41/6606   Systolic dysfunction    Severe sepsis with acute organ dysfunction ([place]) 07/22/2021   Chest pain 07/22/2021   AKI (acute kidney injury) ([place]) 07/22/2021   Gas gangrene of foot ([place]) 07/22/2021   Paroxysmal atrial flutter ([place]) 07/22/2021   Septic shock ([place]) 07/22/2021   Shortness of breath 05/16/2021   Chronic diastolic (congestive) heart failure ([place]) 05/16/2021   Diabetic infection of left foot ([place]) 04/17/2021   S/P bilateral BKA (below knee amputation) ([place]) 10/30/2019   Dyslipidemia 10/30/2019   Type 2 diabetes mellitus with diabetic polyneuropathy, with long-term current use of insulin ([place]) 10/12/2019    Type 2 diabetes mellitus 10/12/2019   Essential hypertension 09/25/2019   Type 2 diabetes mellitus with hyperglycemia, with long-term current use of insulin ([place]) 09/25/2019   Stage 3a chronic kidney disease ([place]) 09/25/2019   Hematuria 09/25/2019   Acquired complex renal cyst 09/25/2019   Urinary hesitancy 09/25/2019   Prosthesis adjustments 09/25/2019   Need for immunization against influenza 09/25/2019   Need for Tdap vaccination 09/25/2019   Erectile dysfunction 09/25/2019   Healthcare maintenance 09/25/2019   Pulmonary nodules 09/25/2019   Depression with anxiety 09/25/2019    Orientation RESPIRATION BLADDER Height & Weight     Self, Time, Situation, Place  Normal Incontinent, External catheter Weight:   Height:     BEHAVIORAL SYMPTOMS/MOOD NEUROLOGICAL BOWEL NUTRITION STATUS      Continent Diet (carb modified/diabetic friendly)  AMBULATORY STATUS COMMUNICATION OF NEEDS Skin   Total Care Verbally Other (Comment) (sacral wound)                       Personal Care Assistance Level of Assistance    Bathing Assistance: Independent Feeding assistance: Independent Dressing Assistance: Limited assistance     Functional Limitations Info  Sight, Hearing, Speech Sight Info: Adequate Hearing Info: Adequate Speech Info: Adequate    SPECIAL CARE FACTORS FREQUENCY  PT (By licensed PT), OT (By licensed OT)     PT Frequency: 5x weekly OT Frequency: 5x weekly            Contractures Contractures Info: Not present    Additional Factors Info  Code Status, Allergies Code Status Info: Full Allergies Info: Bactrim (  Sulfamethoxazole-trimethoprim), Ceprotin (Protein C Concentrate (Human)), Ciprofloxacin, Levaquin   Insulin Sliding Scale Info: Novalog, 0-9 units 3x/day with meals, 0-6 units 3x /day with meals       Current Medications (09/30/2021):  This is the current hospital active medication list Current Facility-Administered Medications  Medication Dose Route  Frequency Provider Last Rate Last Admin   ceFEPIme (MAXIPIME) 2 g in sodium chloride 0.9 % 100 mL IVPB  2 g Intravenous Q8H Heloise Purpura, RPH   Stopped at 09/30/21 2200   metroNIDAZOLE (FLAGYL) IVPB 500 mg  500 mg Intravenous Q12H Heloise Purpura, [facility]   Stopped at 09/30/21 2138   [START ON 10/01/2021] vancomycin (VANCOREADY) IVPB 1500 mg/300 mL  1,500 mg Intravenous Q12H Heloise Purpura, RPH       vancomycin (VANCOREADY) IVPB 2000 mg/400 mL  2,000 mg Intravenous Once Heloise Purpura, RPH 200 mL/hr at 09/30/21 2124 2,000 mg at 09/30/21 2124   Current Outpatient Medications  Medication Sig Dispense Refill   amiodarone (PACERONE) 200 MG tablet Take 1 tablet (200 mg total) by mouth daily. 30 tablet 1   apixaban (ELIQUIS) 5 MG TABS tablet Take 1 tablet (5 mg total) by mouth 2 (two) times daily. 60 tablet 1   cefadroxil (DURICEF) 500 MG capsule Take 2 capsules (1,000 mg total) by mouth 2 (two) times daily for 14 days. 56 capsule 0   finasteride (PROSCAR) 5 MG tablet Take 1 tablet (5 mg total) by mouth daily. 30 tablet 1   gabapentin (NEURONTIN) 400 MG capsule Take 1 capsule (400 mg total) by mouth 3 (three) times daily. 90 capsule 1   insulin aspart (NOVOLOG) 100 UNIT/ML injection Inject 6 Units into the skin 3 (three) times daily with meals. (Patient taking differently: Inject 0-12 Units into the skin 3 (three) times daily with meals.) 10 mL 0   metoprolol succinate (TOPROL-XL) 25 MG 24 hr tablet Take 1 tablet (25 mg total) by mouth daily. 30 tablet 1   Multiple Vitamin (MULTIVITAMIN WITH MINERALS) TABS tablet Take 1 tablet by mouth daily. 30 tablet 0   oxyCODONE (OXYCONTIN) 30 MG 12 hr tablet Take 1 tablet (30 mg total) by mouth 2 (two) times daily for 7 days. 14 tablet 0   protein supplement shake (PREMIER PROTEIN) LIQD Take 2 oz by mouth 3 (three) times daily with meals.     SANTYL ointment SMARTSIG:3 Topical 3 Times Daily     traZODone (DESYREL) 150 MG tablet Take 1 tablet (150 mg  total) by mouth at bedtime as needed for sleep. 15 tablet 0   HYDROmorphone (DILAUDID) 4 MG tablet Take 1 tablet (4 mg total) by mouth every 6 (six) hours as needed for severe pain. (Patient not taking: Reported on 09/30/2021) 30 tablet 0   insulin glargine (LANTUS SOLOSTAR) 100 UNIT/ML Solostar Pen Inject 36 Units into the skin 2 (two) times daily. 15 mL 1   Insulin Pen Needle 32G X 4 MM MISC Use as directed 100 each 1   tamsulosin (FLOMAX) 0.4 MG CAPS capsule Take 1 capsule (0.4 mg total) by mouth daily. 30 capsule 1   testosterone (ANDROGEL) 50 MG/5GM (1%) GEL Place 5 g onto the skin daily. (Patient not taking: Reported on 09/30/2021) 150 g 0     Discharge Medications: Please see discharge summary for a list of discharge medications.  Relevant Imaging Results:  Relevant Lab Results:   Additional Information SSN: [id number]/ Pt has had Covid vaccine  Aslan Himes, LCSW

## 2021-10-01 LAB — CBG MONITORING, ED
Glucose-Capillary: 319 mg/dL — ABNORMAL HIGH (ref 70–99)
Glucose-Capillary: 304 mg/dL — ABNORMAL HIGH (ref 70–99)
Glucose-Capillary: 362 mg/dL — ABNORMAL HIGH (ref 70–99)
Glucose-Capillary: 326 mg/dL — ABNORMAL HIGH (ref 70–99)
Glucose-Capillary: 316 mg/dL — ABNORMAL HIGH (ref 70–99)

## 2021-10-01 LAB — RAPID URINE DRUG SCREEN, HOSP PERFORMED
Amphetamines: NOT DETECTED
Barbiturates: NOT DETECTED
Benzodiazepines: NOT DETECTED
Cocaine: NOT DETECTED
Opiates: NOT DETECTED
Tetrahydrocannabinol: NOT DETECTED

## 2021-10-01 MED ORDER — CEFADROXIL 500 MG PO CAPS
1000.0000 mg | ORAL_CAPSULE | Freq: Two times a day (BID) | ORAL | Status: DC
  Administered 2021-10-01 – 2021-10-02 (×3): 1000 mg via ORAL
  Filled 2021-10-01 (×4): qty 2

## 2021-10-01 MED ORDER — OXYCODONE HCL ER 15 MG PO T12A
30.0000 mg | EXTENDED_RELEASE_TABLET | Freq: Two times a day (BID) | ORAL | Status: DC
  Administered 2021-10-01 – 2021-10-02 (×2): 30 mg via ORAL
  Filled 2021-10-01 (×4): qty 2

## 2021-10-01 MED ORDER — INSULIN ASPART 100 UNIT/ML IJ SOLN
0.0000 [IU] | Freq: Three times a day (TID) | INTRAMUSCULAR | Status: DC
  Administered 2021-10-02: 5 [IU] via SUBCUTANEOUS
  Administered 2021-10-02: 8 [IU] via SUBCUTANEOUS
  Administered 2021-10-02: 11 [IU] via SUBCUTANEOUS

## 2021-10-01 MED ORDER — INSULIN ASPART 100 UNIT/ML IJ SOLN
0.0000 [IU] | Freq: Every day | INTRAMUSCULAR | Status: DC
  Administered 2021-10-01: 4 [IU] via SUBCUTANEOUS

## 2021-10-01 NOTE — Progress Notes (Addendum)
[facility] in [place] and [facility] agreed to review patient in hub.   [facility] also stated they would review patient in the hub.

## 2021-10-01 NOTE — Progress Notes (Signed)
CSW contacted the following facilities for SNF review. Patient has been denied at 15 facilities so far.  [name]- Will review [facility]- No beds  [facility]- Left VM with admissions  [facility]- Left VM with admissions

## 2021-10-01 NOTE — ED Notes (Signed)
Pt saturated self in urine despite condom catheter in place. Brief, blankets, and chux pad changed at this time. Pt provided w/ water. Pt verbalized being upset about IV pain medicine order being completed and stated he didn't understand why he couldn't get it since he was still in the emergency room. Educated pt on orders and that his home pain medication was re-ordered. Pt denies any more needs at this time, turned onto R side.

## 2021-10-01 NOTE — Progress Notes (Signed)
CSW met with Pt at bedside in regards to SN placement. Pt will be going to [facility] for SNF. Facility will start auth.

## 2021-10-01 NOTE — Consult Note (Signed)
[facility] Nurse Consult Note: Patient receiving care in [facility] ED 49. Reason for Consult: sacral wound Wound type: stage 4 sacral pressure injury, chronic in nature, chronic osteomyelitis in spine. Pressure Injury POA: Yes Measurement: The wound basically has two main segments. See photos for visual.  The entire wound area measures 5 cm x 5 cm x 2.1 cm.  On the left side of the sacrum there is undermining extending to 4.2 cm. On the right side of the sacrum the undermining is approximately 2.7 cm Wound bed: pink and yellow Drainage (amount, consistency, odor) yellow drainage on existing dressing. Periwound: intact Dressing procedure/placement/frequency: Moisten kerlix with saline. Insert it into the sacral wound. Be sure to tuck it into the undermining at the top of the wound. Cover with a foam dressing.  This is a twice daily dressing.  Thank you for the consult.  Discussed plan of care with the patient and bedside nurse.  [place] nurse will not follow at this time.  Please re-consult the [place] team if needed.  Val Riles, RN, MSN, CWOCN, CNS-BC, pager [phone]

## 2021-10-01 NOTE — ED Notes (Signed)
Dinner tray delivered to pt at this time.  °

## 2021-10-01 NOTE — ED Notes (Signed)
Pt requesting snack, pt instructed to wait until dinner d/t hyperglycemia.

## 2021-10-01 NOTE — ED Provider Notes (Signed)
Emergency Medicine Observation Re-evaluation Note  [name] is a 56 y.o. male, seen on rounds today.  Pt initially presented to the ED for complaints of Skin Ulcer Currently, the patient is sleeping.  Physical Exam  BP 135/83 (BP Location: Left Arm)    Pulse 60    Temp 98.2 F (36.8 C) (Oral)    Resp 16    SpO2 97%  Physical Exam General: no distress Lungs: even and unlabored Psych: calm and cooperative  ED Course / MDM  EKG:EKG Interpretation  Date/Time:  Tuesday September 30 2021 17:09:42 EST Ventricular Rate:  86 PR Interval:  152 QRS Duration: 98 QT Interval:  444 QTC Calculation: 531 R Axis:   50 Text Interpretation: Normal sinus rhythm ST & T wave abnormality, consider lateral ischemia Abnormal ECG Confirmed by Ripley Fraise [phone]) on 09/30/2021 11:21:36 PM  I have reviewed the labs performed to date as well as medications administered while in observation.  Recent changes in the last 24 hours include visit for chronic sacral wound, limited outpatient care. Evaluated by surgery and hospitalist, no indication for surgical debridement or admission for Abx.   Plan  Current plan is for SNF or ALF placement.  [name] is not under involuntary commitment.     Truddie Hidden, MD 10/01/21 [phone]

## 2021-10-01 NOTE — ED Notes (Signed)
PT here to assess and work with patient.

## 2021-10-01 NOTE — ED Notes (Signed)
Pt requested wound care to be done in a few hours, requesting to take a nap at this time.

## 2021-10-01 NOTE — ED Notes (Signed)
Dressing to sacral wound applied. Kerlix moistened and placed in sacral wound. Foam dressing placed on top. Pt tolerated well. Pt changed into clean gown per his request.

## 2021-10-01 NOTE — ED Notes (Signed)
Lunch tray delivered to pt at this time.

## 2021-10-01 NOTE — Evaluation (Signed)
Physical Therapy Evaluation Patient Details Name: [name] MRN: [id number] DOB: [birth year] Today's Date: 10/01/2021  History of Present Illness  Pt sia 56 y/o male presenting to the ED secondary to worsening sacral wound. Pt with recent admission for L BKA and thoracic discitis. PMH includes B BKA, DM, depression, anxiety.  Clinical Impression  Pt admitted secondary to problem above with deficits below. Pt with increased pain in back and sacrum. Required mod to max A for bed mobility tasks this session. Pt heavily reliant on BUE to maintain sitting balance. Pt reports he was previously mod I with transfers, however, has had increased difficulty secondary to pain. Recommending SNF level therapies at d/c to address current deficits. Will continue to follow acutely.        Recommendations for follow up therapy are one component of a multi-disciplinary discharge planning process, led by the attending physician.  Recommendations may be updated based on patient status, additional functional criteria and insurance authorization.  Follow Up Recommendations Skilled nursing-short term rehab (<3 hours/day)    Assistance Recommended at Discharge Frequent or constant Supervision/Assistance  Patient can return home with the following  A lot of help with walking and/or transfers;A lot of help with bathing/dressing/bathroom    Equipment Recommendations Other (comment) (hoyer lift)  Recommendations for Other Services       Functional Status Assessment Patient has had a recent decline in their functional status and demonstrates the ability to make significant improvements in function in a reasonable and predictable amount of time.     Precautions / Restrictions Precautions Precautions: Fall Precaution Comments: bilateral BKA Restrictions Weight Bearing Restrictions: No      Mobility  Bed Mobility Overal bed mobility: Needs Assistance Bed Mobility: Sidelying to Sit, Sit to Sidelying    Sidelying to sit: Max assist     Sit to sidelying: Mod assist General bed mobility comments: Required increased time and effort to come to sitting on stretcher. Max A for trunk and LE assist to come to sitting. Mod A for assist with LE and trunk for return to supine. .    Transfers                        Ambulation/Gait                  Stairs            Wheelchair Mobility    Modified Rankin (Stroke Patients Only)       Balance Overall balance assessment: Needs assistance Sitting-balance support: Bilateral upper extremity supported, Single extremity supported, No upper extremity supported Sitting balance-Leahy Scale: Poor Sitting balance - Comments: Heavily reliant on BUE support                                     Pertinent Vitals/Pain Pain Assessment Pain Assessment: Faces Faces Pain Scale: Hurts whole lot Pain Location: back and sacrum Pain Descriptors / Indicators: Grimacing, Guarding Pain Intervention(s): Limited activity within patient's tolerance, Monitored during session, Repositioned    Home Living Family/patient expects to be discharged to:: Skilled nursing facility Living Arrangements: Alone Available Help at Discharge: Friend(s);Available 24 hours/day Type of Home: Apartment Home Access: Elevator       Home Layout: One level Home Equipment: Grab bars - toilet;Grab bars - tub/shower;Shower seat;BSC/3in1;Wheelchair - manual;Hospital bed      Prior Function Prior Level of Function : Needs  assist             Mobility Comments: Limited secondary to pain, but reports he was able to transfer to [facility] without assist. ADLs Comments: needed assist with wound care and ADL tasks. Limited secondary to pain     Hand Dominance        Extremity/Trunk Assessment   Upper Extremity Assessment Upper Extremity Assessment: Defer to OT evaluation    Lower Extremity Assessment Lower Extremity Assessment: RLE  deficits/detail;LLE deficits/detail RLE Deficits / Details: BKA at baseline LLE Deficits / Details: BKA at baseline    Cervical / Trunk Assessment Cervical / Trunk Assessment: Other exceptions Cervical / Trunk Exceptions: hx of discitis  Communication   Communication: No difficulties  Cognition Arousal/Alertness: Awake/alert Behavior During Therapy: WFL for tasks assessed/performed Overall Cognitive Status: No family/caregiver present to determine baseline cognitive functioning                                 General Comments: Reporting he did not know he had sacral issues, although noted he had wound care during last admission.        General Comments      Exercises     Assessment/Plan    PT Assessment Patient needs continued PT services  PT Problem List Decreased strength;Decreased mobility;Decreased range of motion;Decreased activity tolerance;Decreased balance;Pain;Decreased skin integrity;Decreased knowledge of use of DME;Decreased knowledge of precautions       PT Treatment Interventions Functional mobility training;Therapeutic activities;Patient/family education;Balance training;Therapeutic exercise;DME instruction    PT Goals (Current goals can be found in the Care Plan section)  Acute Rehab PT Goals Patient Stated Goal: to be indepedent PT Goal Formulation: With patient Time For Goal Achievement: 10/15/21 Potential to Achieve Goals: Fair    Frequency Min 2X/week     Co-evaluation               AM-PAC PT "6 Clicks" Mobility  Outcome Measure Help needed turning from your back to your side while in a flat bed without using bedrails?: A Lot Help needed moving from lying on your back to sitting on the side of a flat bed without using bedrails?: A Lot Help needed moving to and from a bed to a chair (including a wheelchair)?: A Lot Help needed standing up from a chair using your arms (e.g., wheelchair or bedside chair)?: Total Help needed to  walk in hospital room?: Total Help needed climbing 3-5 steps with a railing? : Total 6 Click Score: 9    End of Session         PT Visit Diagnosis: Other abnormalities of gait and mobility (R26.89);Difficulty in walking, not elsewhere classified (R26.2);Pain Pain - part of body:  (back)    Time: 1101-1120 PT Time Calculation (min) (ACUTE ONLY): 19 min   Charges:   PT Evaluation $PT Eval Moderate Complexity: 1 Mod          Reuel Derby, PT, DPT  Acute Rehabilitation Services  Pager: [phone] Office: [phone]   Rudean Hitt 10/01/2021, 11:43 AM

## 2021-10-01 NOTE — ED Notes (Signed)
Pts linen wet, pt changed, bandage redressed

## 2021-10-01 NOTE — ED Provider Notes (Signed)
Patient stable overnight. He is awaiting placement.  Patient has multiple complex issues, his home meds have been ordered.  He has already been seen by case management who is working on placement   Ripley Fraise, MD 10/01/21 [phone]

## 2021-10-01 NOTE — ED Provider Notes (Signed)
°  Physical Exam  BP (!) 141/91 (BP Location: Left Arm)    Pulse 70    Temp 98.8 F (37.1 C) (Oral)    Resp 18    SpO2 97%   Physical Exam  Procedures  Procedures  ED Course / MDM    Medical Decision Making I was asked to see the patient.  Patient was recently admitted for osteomyelitis and already on antibiotics.  Patient wanted to be back on sliding scale insulin. He is already on premeal insulin but glucose still in the 300s. Also wants foley catheter. He has stage 4 decub ulcer and is incontinent at baseline. Also states that he was prescribed oxycontin. I ordered sliding scale insulin, foley catheter, and his home dose of oxycontin. Still pending nursing home placement   Amount and/or Complexity of Data Reviewed Labs: ordered. Radiology: ordered.  Risk Prescription drug management.        Drenda Freeze, MD 10/01/21 [phone]

## 2021-10-01 NOTE — Progress Notes (Signed)
[facility] SNF declined bed offer

## 2021-10-01 NOTE — Progress Notes (Signed)
Patient was offered a bed at [facility]. CSW is not sure patient will take offer due to him wanting only 4 or 5 star facilities. Patient will have to take the offers given or discharge home.   CSW spoke with Perrin Smack at [facility] who currently has no beds.

## 2021-10-01 NOTE — ED Notes (Signed)
Pt ate 100% meal.

## 2021-10-01 NOTE — Progress Notes (Signed)
Inpatient Diabetes Program Recommendations  AACE/ADA: New Consensus Statement on Inpatient Glycemic Control (2015)  Target Ranges:  Prepandial:   less than 140 mg/dL      Peak postprandial:   less than 180 mg/dL (1-2 hours)      Critically ill patients:  140 - 180 mg/dL   Lab Results  Component Value Date   GLUCAP 362 (H) 10/01/2021   HGBA1C 13.8 (H) 07/22/2021    Review of Glycemic Control  Diabetes history: DM2 Outpatient Diabetes medications: Lantus 36 units BID, Novolog 6 units TID with meals Current orders for Inpatient glycemic control: Semglee 36 units BID, Novolog 6 units TID  HgbA1C - 13.8% - poor glycemic control Various Diabetes Coordinators have spoken to pt within the past year about importance of maintaining good glycemic control.   Inpatient Diabetes Program Recommendations:    Add Novolog 0-15 units TID with meals and 0-5 HS Increase Novolog to 10 units TID with meals if eating > 50%. Needs to f/u with Endo for diabetes management and HgbA1C of 13.8%  Continue to follow.  Thank you. Lorenda Peck, RD, LDN, CDE Inpatient Diabetes Coordinator [phone]

## 2021-10-01 NOTE — ED Notes (Signed)
Secure chat sent to MD regarding CBG consistently > 300 regarding possible need for sliding scale insulin vs current order for 6 units insulin with meals.

## 2021-10-02 ENCOUNTER — Other Ambulatory Visit: Payer: Self-pay

## 2021-10-02 DIAGNOSIS — N1831 Chronic kidney disease, stage 3a: Secondary | ICD-10-CM | POA: Diagnosis not present

## 2021-10-02 DIAGNOSIS — N189 Chronic kidney disease, unspecified: Secondary | ICD-10-CM | POA: Diagnosis not present

## 2021-10-02 DIAGNOSIS — M869 Osteomyelitis, unspecified: Secondary | ICD-10-CM | POA: Diagnosis not present

## 2021-10-02 DIAGNOSIS — L259 Unspecified contact dermatitis, unspecified cause: Secondary | ICD-10-CM | POA: Diagnosis not present

## 2021-10-02 DIAGNOSIS — R739 Hyperglycemia, unspecified: Secondary | ICD-10-CM | POA: Diagnosis not present

## 2021-10-02 DIAGNOSIS — F331 Major depressive disorder, recurrent, moderate: Secondary | ICD-10-CM | POA: Diagnosis not present

## 2021-10-02 DIAGNOSIS — I700 Atherosclerosis of aorta: Secondary | ICD-10-CM | POA: Diagnosis not present

## 2021-10-02 DIAGNOSIS — E119 Type 2 diabetes mellitus without complications: Secondary | ICD-10-CM | POA: Diagnosis not present

## 2021-10-02 DIAGNOSIS — I5020 Unspecified systolic (congestive) heart failure: Secondary | ICD-10-CM | POA: Diagnosis not present

## 2021-10-02 DIAGNOSIS — E113293 Type 2 diabetes mellitus with mild nonproliferative diabetic retinopathy without macular edema, bilateral: Secondary | ICD-10-CM | POA: Diagnosis not present

## 2021-10-02 DIAGNOSIS — F419 Anxiety disorder, unspecified: Secondary | ICD-10-CM | POA: Diagnosis not present

## 2021-10-02 DIAGNOSIS — H5213 Myopia, bilateral: Secondary | ICD-10-CM | POA: Diagnosis not present

## 2021-10-02 DIAGNOSIS — E1122 Type 2 diabetes mellitus with diabetic chronic kidney disease: Secondary | ICD-10-CM | POA: Diagnosis not present

## 2021-10-02 DIAGNOSIS — M2550 Pain in unspecified joint: Secondary | ICD-10-CM | POA: Diagnosis not present

## 2021-10-02 DIAGNOSIS — L89154 Pressure ulcer of sacral region, stage 4: Secondary | ICD-10-CM | POA: Diagnosis not present

## 2021-10-02 DIAGNOSIS — Z7401 Bed confinement status: Secondary | ICD-10-CM | POA: Diagnosis not present

## 2021-10-02 DIAGNOSIS — Z20822 Contact with and (suspected) exposure to covid-19: Secondary | ICD-10-CM | POA: Diagnosis not present

## 2021-10-02 DIAGNOSIS — N3289 Other specified disorders of bladder: Secondary | ICD-10-CM | POA: Diagnosis not present

## 2021-10-02 DIAGNOSIS — N320 Bladder-neck obstruction: Secondary | ICD-10-CM | POA: Diagnosis not present

## 2021-10-02 DIAGNOSIS — H2513 Age-related nuclear cataract, bilateral: Secondary | ICD-10-CM | POA: Diagnosis not present

## 2021-10-02 DIAGNOSIS — L89159 Pressure ulcer of sacral region, unspecified stage: Secondary | ICD-10-CM | POA: Diagnosis not present

## 2021-10-02 DIAGNOSIS — G8929 Other chronic pain: Secondary | ICD-10-CM | POA: Diagnosis not present

## 2021-10-02 DIAGNOSIS — I10 Essential (primary) hypertension: Secondary | ICD-10-CM | POA: Diagnosis not present

## 2021-10-02 DIAGNOSIS — R5381 Other malaise: Secondary | ICD-10-CM | POA: Diagnosis not present

## 2021-10-02 DIAGNOSIS — E1165 Type 2 diabetes mellitus with hyperglycemia: Secondary | ICD-10-CM | POA: Diagnosis not present

## 2021-10-02 DIAGNOSIS — L89150 Pressure ulcer of sacral region, unstageable: Secondary | ICD-10-CM | POA: Diagnosis not present

## 2021-10-02 DIAGNOSIS — R262 Difficulty in walking, not elsewhere classified: Secondary | ICD-10-CM | POA: Diagnosis not present

## 2021-10-02 DIAGNOSIS — R9431 Abnormal electrocardiogram [ECG] [EKG]: Secondary | ICD-10-CM | POA: Diagnosis not present

## 2021-10-02 DIAGNOSIS — R2689 Other abnormalities of gait and mobility: Secondary | ICD-10-CM | POA: Diagnosis not present

## 2021-10-02 DIAGNOSIS — M4628 Osteomyelitis of vertebra, sacral and sacrococcygeal region: Secondary | ICD-10-CM | POA: Diagnosis not present

## 2021-10-02 LAB — RESP PANEL BY RT-PCR (FLU A&B, COVID) ARPGX2
Influenza A by PCR: NEGATIVE
Influenza B by PCR: NEGATIVE
SARS Coronavirus 2 by RT PCR: NEGATIVE

## 2021-10-02 LAB — CBG MONITORING, ED
Glucose-Capillary: 319 mg/dL — ABNORMAL HIGH (ref 70–99)
Glucose-Capillary: 207 mg/dL — ABNORMAL HIGH (ref 70–99)
Glucose-Capillary: 253 mg/dL — ABNORMAL HIGH (ref 70–99)

## 2021-10-02 NOTE — ED Notes (Signed)
Pt discharged to [facility] and rehab center with ptar Report attempted x3 with no success. Name and number left for call back

## 2021-10-02 NOTE — ED Provider Notes (Addendum)
Emergency Medicine Observation Re-evaluation Note  [name] is a 56 y.o. male, seen on rounds today.  Pt initially presented to the ED for complaints of Skin Ulcer Currently, the patient is resting comfortably.  Physical Exam  BP 120/61 (BP Location: Left Arm)    Pulse 81    Temp 98.5 F (36.9 C) (Oral)    Resp 18    SpO2 95%  Physical Exam General: Nontoxic appearing Cardiac: Normal heart rate Lungs: Normal respiratory Psych: Not responding to internal stimuli  ED Course / MDM  EKG:EKG Interpretation  Date/Time:  Tuesday September 30 2021 17:09:42 EST Ventricular Rate:  86 PR Interval:  152 QRS Duration: 98 QT Interval:  444 QTC Calculation: 531 R Axis:   50 Text Interpretation: Normal sinus rhythm ST & T wave abnormality, consider lateral ischemia Abnormal ECG Confirmed by Ripley Fraise [phone]) on 09/30/2021 11:21:36 PM  I have reviewed the labs performed to date as well as medications administered while in observation.  Recent changes in the last 24 hours include Foley catheter was placed to help protect his sacral decubiti.  This is listed as a stage III-IV, with urinary incontinence.  Plan  Current plan is for placement in a nursing care facility.[name] is not under involuntary commitment.     Daleen Bo, MD 10/02/21 1544    Daleen Bo, MD 10/02/21 [phone]

## 2021-10-02 NOTE — Progress Notes (Signed)
CSW spoke with [facility] rehab who reported that patients insurance authorization is still pending.

## 2021-10-02 NOTE — Discharge Instructions (Signed)
Continue your current treatments °

## 2021-10-02 NOTE — Patient Outreach (Signed)
[facility]) Care Management  10/02/2021  [name] [birth year] [id number]     Transition of Care Referral  Referral Date:09/30/2021  Date of Discharge: 09/19/2021 Facility: [facility]     Upon chart review noted that patient currently inpatient. Plan is for patient to discharge to SNF and bed offer made.    Plan: RN CM will close referral.   Enzo Montgomery, RN,BSN,CCM [place] Management Telephonic Care Management Coordinator Direct Phone: [phone] Toll Free: [phone] Fax: [phone]

## 2021-10-02 NOTE — Evaluation (Signed)
Occupational Therapy Evaluation Patient Details Name: [name] MRN: [id number] DOB: [birth year] Today's Date: 10/02/2021   History of Present Illness Pt sia 56 y/o male presenting to the ED secondary to worsening sacral wound. Pt with recent admission for L BKA and thoracic discitis. PMH includes B BKA, DM, depression, anxiety.   Clinical Impression   PTA patient reports living at home with support from friend multiple times a day, mainly bed bound and requiring assist for ADLs and wound care.  He does report transferring to wc a few times without assist PTA.  He was admitted for above and limited by pain, weakness, impaired balance.  He currently requires up to mod assist for LB ADLs at bed level and unable to maintain sitting balance at EOB without R UE support, mod assist for bed mobility.   Believe he will benefit from continued OT services while admitted and after dc at SNF level to optimize independence with self care and mobility. Will follow.      Recommendations for follow up therapy are one component of a multi-disciplinary discharge planning process, led by the attending physician.  Recommendations may be updated based on patient status, additional functional criteria and insurance authorization.   Follow Up Recommendations  Skilled nursing-short term rehab (<3 hours/day)    Assistance Recommended at Discharge Intermittent Supervision/Assistance  Patient can return home with the following A lot of help with walking and/or transfers;A lot of help with bathing/dressing/bathroom;Assistance with cooking/housework;Assist for transportation;Help with stairs or ramp for entrance    Functional Status Assessment  Patient has had a recent decline in their functional status and demonstrates the ability to make significant improvements in function in a reasonable and predictable amount of time.  Equipment Recommendations  BSC/3in1;Hospital bed (drop arm commode)    Recommendations for  Other Services       Precautions / Restrictions Precautions Precautions: Fall Precaution Comments: bilateral BKA, sacral wound Restrictions Weight Bearing Restrictions: No      Mobility Bed Mobility Overal bed mobility: Needs Assistance Bed Mobility: Sidelying to Sit, Rolling, Sit to Sidelying Rolling: Min guard Sidelying to sit: Mod assist     Sit to sidelying: Mod assist General bed mobility comments: increased time with support to ascend trunk.    Transfers                          Balance Overall balance assessment: Needs assistance Sitting-balance support: Bilateral upper extremity supported, Single extremity supported, No upper extremity supported Sitting balance-Leahy Scale: Poor Sitting balance - Comments: Heavily reliant on BUE support, can release L UE brielfy but not R UE                                   ADL either performed or assessed with clinical judgement   ADL Overall ADL's : Needs assistance/impaired     Grooming: Set up;Bed level   Upper Body Bathing: Bed level;Minimal assistance   Lower Body Bathing: Moderate assistance;Bed level   Upper Body Dressing : Minimal assistance;Bed level   Lower Body Dressing: Moderate assistance;Bed level     Toilet Transfer Details (indicate cue type and reason): unable         Functional mobility during ADLs: Moderate assistance General ADL Comments: transitioned to EOB, relies on UE support upright     Vision   Vision Assessment?: No apparent visual deficits  Perception     Praxis      Pertinent Vitals/Pain Pain Assessment Pain Assessment: Faces Faces Pain Scale: Hurts whole lot Pain Location: back and sacrum Pain Descriptors / Indicators: Grimacing, Guarding Pain Intervention(s): Limited activity within patient's tolerance, Monitored during session, Repositioned     Hand Dominance Right   Extremity/Trunk Assessment Upper Extremity Assessment Upper Extremity  Assessment: Overall WFL for tasks assessed   Lower Extremity Assessment Lower Extremity Assessment: Defer to PT evaluation   Cervical / Trunk Assessment Cervical / Trunk Assessment: Other exceptions Cervical / Trunk Exceptions: hx of discitis   Communication Communication Communication: No difficulties   Cognition Arousal/Alertness: Awake/alert Behavior During Therapy: WFL for tasks assessed/performed Overall Cognitive Status: No family/caregiver present to determine baseline cognitive functioning                                 General Comments: cognition appears WFL, reports knowing about sacral issues today     General Comments       Exercises     Shoulder Instructions      Home Living Family/patient expects to be discharged to:: Skilled nursing facility Living Arrangements: Alone Available Help at Discharge: Friend(s) Type of Home: Apartment Home Access: Elevator     Home Layout: One level     Bathroom Shower/Tub: Teacher, early years/pre: Handicapped height     Home Equipment: Grab bars - toilet;Grab bars - tub/shower;Shower seat;BSC/3in1;Wheelchair - manual;Hospital bed   Additional Comments: reports was at SNF for 2 days and then dc'd home with support from friend intermittently (2-3x/day)      Prior Functioning/Environment Prior Level of Function : Needs assist             Mobility Comments: Limited secondary to pain, but reports he was able to transfer to [facility] without assist. ADLs Comments: needed assist with wound care and ADL tasks. Limited secondary to pain.        OT Problem List: Decreased strength;Decreased activity tolerance;Impaired balance (sitting and/or standing);Decreased safety awareness;Decreased knowledge of use of DME or AE;Decreased knowledge of precautions;Pain      OT Treatment/Interventions: Self-care/ADL training;DME and/or AE instruction;Therapeutic activities;Patient/family education;Balance  training;Therapeutic exercise;Energy conservation    OT Goals(Current goals can be found in the care plan section) Acute Rehab OT Goals Patient Stated Goal: to get better, heal my wound OT Goal Formulation: With patient Time For Goal Achievement: 10/16/21 Potential to Achieve Goals: Good  OT Frequency: Min 2X/week    Co-evaluation              AM-PAC OT "6 Clicks" Daily Activity     Outcome Measure Help from another person eating meals?: None Help from another person taking care of personal grooming?: A Little Help from another person toileting, which includes using toliet, bedpan, or urinal?: Total Help from another person bathing (including washing, rinsing, drying)?: A Lot Help from another person to put on and taking off regular upper body clothing?: A Little Help from another person to put on and taking off regular lower body clothing?: A Lot 6 Click Score: 15   End of Session Nurse Communication: Mobility status;Other (comment) (pt concerns with urine)  Activity Tolerance: Patient tolerated treatment well Patient left: in bed;with call bell/phone within reach  OT Visit Diagnosis: Pain;Muscle weakness (generalized) (M62.81) Pain - part of body:  (back, sacrum)  Time: 0940-1000 OT Time Calculation (min): 20 min Charges:  OT General Charges $OT Visit: 1 Visit OT Evaluation $OT Eval Low Complexity: 1 Low  Jolaine Artist, OT Acute Rehabilitation Services Pager [phone] Office [phone]   Delight Stare 10/02/2021, 10:14 AM

## 2021-10-02 NOTE — Progress Notes (Signed)
Pt has received insurance auth. Pt can go to [facility] room # [phone] # for report [phone]

## 2021-10-02 NOTE — ED Notes (Signed)
Dressing on bed sore changed. Pt rolled and repositioned to left side

## 2021-10-03 ENCOUNTER — Ambulatory Visit: Payer: Medicare HMO | Admitting: General Practice

## 2021-10-05 LAB — CULTURE, BLOOD (ROUTINE X 2)
Culture: NO GROWTH
Culture: NO GROWTH
Special Requests: ADEQUATE
Special Requests: ADEQUATE

## 2021-10-06 ENCOUNTER — Ambulatory Visit: Payer: Medicare HMO | Admitting: Nurse Practitioner

## 2021-10-07 ENCOUNTER — Inpatient Hospital Stay: Payer: Medicare HMO | Admitting: Internal Medicine

## 2021-10-07 DIAGNOSIS — I10 Essential (primary) hypertension: Secondary | ICD-10-CM | POA: Diagnosis not present

## 2021-10-08 DIAGNOSIS — L89154 Pressure ulcer of sacral region, stage 4: Secondary | ICD-10-CM | POA: Diagnosis not present

## 2021-10-08 DIAGNOSIS — L259 Unspecified contact dermatitis, unspecified cause: Secondary | ICD-10-CM | POA: Diagnosis not present

## 2021-10-10 DIAGNOSIS — E1165 Type 2 diabetes mellitus with hyperglycemia: Secondary | ICD-10-CM | POA: Diagnosis not present

## 2021-10-10 DIAGNOSIS — I10 Essential (primary) hypertension: Secondary | ICD-10-CM | POA: Diagnosis not present

## 2021-10-14 DIAGNOSIS — M869 Osteomyelitis, unspecified: Secondary | ICD-10-CM | POA: Diagnosis not present

## 2021-10-14 DIAGNOSIS — R5381 Other malaise: Secondary | ICD-10-CM | POA: Diagnosis not present

## 2021-10-15 DIAGNOSIS — L89154 Pressure ulcer of sacral region, stage 4: Secondary | ICD-10-CM | POA: Diagnosis not present

## 2021-10-20 ENCOUNTER — Encounter (HOSPITAL_BASED_OUTPATIENT_CLINIC_OR_DEPARTMENT_OTHER): Payer: Medicare HMO | Admitting: Internal Medicine

## 2021-10-22 DIAGNOSIS — L89154 Pressure ulcer of sacral region, stage 4: Secondary | ICD-10-CM | POA: Diagnosis not present

## 2021-10-24 ENCOUNTER — Ambulatory Visit: Payer: Medicare HMO | Admitting: Nurse Practitioner

## 2021-10-28 ENCOUNTER — Inpatient Hospital Stay: Payer: Medicare HMO | Admitting: Internal Medicine

## 2021-10-28 DIAGNOSIS — G8929 Other chronic pain: Secondary | ICD-10-CM | POA: Diagnosis not present

## 2021-10-28 DIAGNOSIS — F331 Major depressive disorder, recurrent, moderate: Secondary | ICD-10-CM | POA: Diagnosis not present

## 2021-10-29 ENCOUNTER — Other Ambulatory Visit: Payer: Self-pay

## 2021-10-29 ENCOUNTER — Ambulatory Visit (INDEPENDENT_AMBULATORY_CARE_PROVIDER_SITE_OTHER): Payer: Self-pay | Admitting: Family

## 2021-10-29 ENCOUNTER — Encounter: Payer: Self-pay | Admitting: Family

## 2021-10-29 DIAGNOSIS — Z89512 Acquired absence of left leg below knee: Secondary | ICD-10-CM

## 2021-10-29 DIAGNOSIS — L89154 Pressure ulcer of sacral region, stage 4: Secondary | ICD-10-CM | POA: Diagnosis not present

## 2021-10-29 DIAGNOSIS — Z89511 Acquired absence of right leg below knee: Secondary | ICD-10-CM

## 2021-10-29 NOTE — Progress Notes (Signed)
Office Visit Note   Patient: [name]           Date of Birth: [birth year]           MRN: [id number] Visit Date: 10/29/2021              Requested by: No referring provider defined for this encounter. PCP: Patient, No Pcp Per (Inactive)  Chief Complaint  Patient presents with   Right Leg - Follow-up    07/2021 right BKA      HPI: The patient is a 56 year old gentleman who is seen today for prosthetic evaluation on the left.  He is a bilateral below the knee amputee he does have a prosthesis for his right leg.  He states his current sleeves and liners are broken down for his right below-knee prosthesis.  He is eager to get set up with a prosthesis on the left  Patient is a new left transtibial  amputee.  Patient's current comorbidities are not expected to impact the ability to function with the prescribed prosthesis. Patient verbally communicates a strong desire to use a prosthesis. Patient currently requires mobility aids to ambulate without a prosthesis.  Expects not to use mobility aids with a new prosthesis.  Patient is a K2 level ambulator that will use a prosthesis to walk around their home and the community over low level environmental barriers.      Assessment & Plan: Visit Diagnoses: No diagnosis found.  Plan: Given an order for his prosthesis set up for his left residual limb as well as supplies for his right below-knee amputation.  He will follow-up in the office as needed.  Follow-Up Instructions: Return if symptoms worsen or fail to improve.   Ortho Exam  Patient is alert, oriented, no adenopathy, well-dressed, normal affect, normal respiratory effort. On examination left residual limb there is good consolidation.  No callus no impending ulceration.  Imaging: No results found. No images are attached to the encounter.  Labs: Lab Results  Component Value Date   HGBA1C 13.8 (H) 07/22/2021   HGBA1C 8.6 (A) 10/11/2019   ESRSEDRATE 81 (H) 09/13/2021    ESRSEDRATE 45 (H) 08/26/2021   ESRSEDRATE 34 (H) 08/20/2021   CRP 10.4 (H) 09/13/2021   CRP 2.9 (H) 08/26/2021   CRP 1.3 (H) 08/20/2021   REPTSTATUS 10/05/2021 FINAL 09/30/2021   CULT  09/30/2021    NO GROWTH 5 DAYS Performed at [facility], [place] [address] [place], [place] [address]    LABORGA GROUP B STREP(S.AGALACTIAE)ISOLATED 07/21/2021     Lab Results  Component Value Date   ALBUMIN 1.9 (L) 09/16/2021   ALBUMIN 1.9 (L) 08/20/2021   ALBUMIN 1.8 (L) 08/17/2021    Lab Results  Component Value Date   MG 1.5 (L) 08/15/2021   MG 1.7 08/12/2021   MG 1.6 (L) 08/11/2021   No results found for: VD25OH  No results found for: PREALBUMIN CBC EXTENDED Latest Ref Rng & Units 09/30/2021 09/17/2021 09/16/2021  WBC 4.0 - 10.5 K/uL 11.5(H) 12.8(H) 11.1(H)  RBC 4.22 - 5.81 MIL/uL 4.38 3.89(L) 3.76(L)  HGB 13.0 - 17.0 g/dL 12.6(L) 11.3(L) 11.1(L)  HCT 39.0 - 52.0 % 38.4(L) 35.1(L) 33.6(L)  PLT 150 - 400 K/uL 381 359 344  NEUTROABS 1.7 - 7.7 K/uL 10.2(H) - -  LYMPHSABS 0.7 - 4.0 K/uL 0.5(L) - -     There is no height or weight on file to calculate BMI.  Orders:  No orders of the defined types were placed  in this encounter.  No orders of the defined types were placed in this encounter.    Procedures: No procedures performed  Clinical Data: No additional findings.  ROS:  All other systems negative, except as noted in the HPI. Review of Systems  Objective: Vital Signs: There were no vitals taken for this visit.  Specialty Comments:  No specialty comments available.  PMFS History: Patient Active Problem List   Diagnosis Date Noted   Stage IV pressure ulcer of sacral region ([place]) 09/15/2021   Insomnia 43/15/4008   Chronic systolic CHF (congestive heart failure) ([place]) 09/14/2021   Hypogonadism in male 09/14/2021   Chronic pain 09/14/2021   Back pain 09/13/2021   Leukocytosis 09/13/2021   Pseudohyponatremia 09/13/2021   Normocytic anemia 09/13/2021    Discitis of thoracic region    Recent bacteremia due to group B Streptococcus, Streptococcus agalactiae, thoracic discitis/osteomyelitis with epidural abscess    Sacral pressure injury of skin 67/61/9509   Systolic dysfunction    Severe sepsis with acute organ dysfunction ([place]) 07/22/2021   Chest pain 07/22/2021   AKI (acute kidney injury) ([place]) 07/22/2021   Gas gangrene of foot ([place]) 07/22/2021   Paroxysmal atrial flutter ([place]) 07/22/2021   Septic shock ([place]) 07/22/2021   Shortness of breath 05/16/2021   Chronic diastolic (congestive) heart failure ([place]) 05/16/2021   Diabetic infection of left foot ([place]) 04/17/2021   S/P bilateral BKA (below knee amputation) ([place]) 10/30/2019   Dyslipidemia 10/30/2019   Type 2 diabetes mellitus with diabetic polyneuropathy, with long-term current use of insulin ([place]) 10/12/2019   Type 2 diabetes mellitus 10/12/2019   Essential hypertension 09/25/2019   Type 2 diabetes mellitus with hyperglycemia, with long-term current use of insulin ([place]) 09/25/2019   Stage 3a chronic kidney disease ([place]) 09/25/2019   Hematuria 09/25/2019   Acquired complex renal cyst 09/25/2019   Urinary hesitancy 09/25/2019   Prosthesis adjustments 09/25/2019   Need for immunization against influenza 09/25/2019   Need for Tdap vaccination 09/25/2019   Erectile dysfunction 09/25/2019   Healthcare maintenance 09/25/2019   Pulmonary nodules 09/25/2019   Depression with anxiety 09/25/2019   Past Medical History:  Diagnosis Date   Chronic diastolic CHF (congestive heart failure) (H[facility])    Complication of anesthesia    Depression    Diabetes mellitus without complication ([place])    Hypertension     Family History  Problem Relation Age of Onset   Anxiety disorder Mother    Cancer Father    Coronary artery disease Father     Past Surgical History:  Procedure Laterality Date   AMPUTATION Left 07/23/2021   Procedure: LEFT BELOW KNEE AMPUTATION;  Surgeon: [name], MD;   Location: [facility];  Service: Orthopedics;  Laterality: Left;   BELOW KNEE LEG AMPUTATION Right    BUBBLE STUDY  07/29/2021   Procedure: BUBBLE STUDY;  Surgeon: Sueanne Margarita, MD;  Location: [facility];  Service: Cardiovascular;;   CARDIOVERSION N/A 07/29/2021   Procedure: CARDIOVERSION;  Surgeon: Sueanne Margarita, MD;  Location: [facility] ENDOSCOPY;  Service: Cardiovascular;  Laterality: N/A;   RADIOLOGY WITH ANESTHESIA N/A 08/05/2021   Procedure: MRI LUMBAR WITH AND WITHOUT; THORASIC SPINE WITH AND WITHOUT WITH ANESTHESIA;  Surgeon: Radiologist, Medication, MD;  Location: [facility];  Service: Radiology;  Laterality: N/A;   RADIOLOGY WITH ANESTHESIA N/A 08/07/2021   Procedure: MRI WITH LUMBER WITH AND WITHOUT CONTRAST,THORACIC WITH AND WITHOUT CONTRAST;  Surgeon: Radiologist, Medication, MD;  Location: [facility];  Service: Radiology;  Laterality: N/A;  RADIOLOGY WITH ANESTHESIA N/A 09/13/2021   Procedure: MRI WITH ANESTHESIA;  Surgeon: Luanne Bras, MD;  Location: [facility];  Service: Radiology;  Laterality: N/A;   TEE WITHOUT CARDIOVERSION N/A 07/29/2021   Procedure: TRANSESOPHAGEAL ECHOCARDIOGRAM (TEE);  Surgeon: Sueanne Margarita, MD;  Location: [facility] ENDOSCOPY;  Service: Cardiovascular;  Laterality: N/A;   Social History   Occupational History   Not on file  Tobacco Use   Smoking status: Never   Smokeless tobacco: Never  Vaping Use   Vaping Use: Never used  Substance and Sexual Activity   Alcohol use: Never   Drug use: Never   Sexual activity: Yes

## 2021-11-03 DIAGNOSIS — R5381 Other malaise: Secondary | ICD-10-CM | POA: Diagnosis not present

## 2021-11-03 DIAGNOSIS — M869 Osteomyelitis, unspecified: Secondary | ICD-10-CM | POA: Diagnosis not present

## 2021-11-03 DIAGNOSIS — L89150 Pressure ulcer of sacral region, unstageable: Secondary | ICD-10-CM | POA: Diagnosis not present

## 2021-11-03 DIAGNOSIS — E119 Type 2 diabetes mellitus without complications: Secondary | ICD-10-CM | POA: Diagnosis not present

## 2021-11-04 DIAGNOSIS — I10 Essential (primary) hypertension: Secondary | ICD-10-CM | POA: Diagnosis not present

## 2021-11-05 DIAGNOSIS — L89154 Pressure ulcer of sacral region, stage 4: Secondary | ICD-10-CM | POA: Diagnosis not present

## 2021-11-12 DIAGNOSIS — L89154 Pressure ulcer of sacral region, stage 4: Secondary | ICD-10-CM | POA: Diagnosis not present

## 2021-11-19 DIAGNOSIS — H2513 Age-related nuclear cataract, bilateral: Secondary | ICD-10-CM | POA: Diagnosis not present

## 2021-11-19 DIAGNOSIS — E113293 Type 2 diabetes mellitus with mild nonproliferative diabetic retinopathy without macular edema, bilateral: Secondary | ICD-10-CM | POA: Diagnosis not present

## 2021-11-25 DIAGNOSIS — G8929 Other chronic pain: Secondary | ICD-10-CM | POA: Diagnosis not present

## 2021-11-25 DIAGNOSIS — F331 Major depressive disorder, recurrent, moderate: Secondary | ICD-10-CM | POA: Diagnosis not present

## 2021-11-26 DIAGNOSIS — R5381 Other malaise: Secondary | ICD-10-CM | POA: Diagnosis not present

## 2021-11-26 DIAGNOSIS — E119 Type 2 diabetes mellitus without complications: Secondary | ICD-10-CM | POA: Diagnosis not present

## 2021-11-26 DIAGNOSIS — L89154 Pressure ulcer of sacral region, stage 4: Secondary | ICD-10-CM | POA: Diagnosis not present

## 2021-11-26 DIAGNOSIS — M869 Osteomyelitis, unspecified: Secondary | ICD-10-CM | POA: Diagnosis not present

## 2021-11-26 DIAGNOSIS — L89150 Pressure ulcer of sacral region, unstageable: Secondary | ICD-10-CM | POA: Diagnosis not present

## 2021-12-05 ENCOUNTER — Telehealth: Payer: Self-pay | Admitting: Nurse Practitioner

## 2021-12-05 ENCOUNTER — Ambulatory Visit (INDEPENDENT_AMBULATORY_CARE_PROVIDER_SITE_OTHER): Payer: Medicare HMO | Admitting: Nurse Practitioner

## 2021-12-05 VITALS — BP 150/117 | HR 80 | Temp 99.0°F

## 2021-12-05 DIAGNOSIS — G8929 Other chronic pain: Secondary | ICD-10-CM

## 2021-12-05 DIAGNOSIS — I4892 Unspecified atrial flutter: Secondary | ICD-10-CM

## 2021-12-05 DIAGNOSIS — F5101 Primary insomnia: Secondary | ICD-10-CM | POA: Diagnosis not present

## 2021-12-05 DIAGNOSIS — R7989 Other specified abnormal findings of blood chemistry: Secondary | ICD-10-CM | POA: Diagnosis not present

## 2021-12-05 DIAGNOSIS — E1142 Type 2 diabetes mellitus with diabetic polyneuropathy: Secondary | ICD-10-CM

## 2021-12-05 DIAGNOSIS — Z0000 Encounter for general adult medical examination without abnormal findings: Secondary | ICD-10-CM

## 2021-12-05 DIAGNOSIS — N400 Enlarged prostate without lower urinary tract symptoms: Secondary | ICD-10-CM | POA: Diagnosis not present

## 2021-12-05 DIAGNOSIS — I10 Essential (primary) hypertension: Secondary | ICD-10-CM

## 2021-12-05 DIAGNOSIS — Z794 Long term (current) use of insulin: Secondary | ICD-10-CM

## 2021-12-05 DIAGNOSIS — E291 Testicular hypofunction: Secondary | ICD-10-CM | POA: Diagnosis not present

## 2021-12-05 LAB — POCT URINALYSIS DIP (CLINITEK)
Bilirubin, UA: NEGATIVE
Glucose, UA: 500 mg/dL — AB
Ketones, POC UA: NEGATIVE mg/dL
Nitrite, UA: NEGATIVE
POC PROTEIN,UA: >=300 — AB
Spec Grav, UA: 1.02 (ref 1.010–1.025)
Urobilinogen, UA: 0.2 E.U./dL
pH, UA: 5.5 (ref 5.0–8.0)

## 2021-12-05 LAB — POCT GLYCOSYLATED HEMOGLOBIN (HGB A1C)
HbA1c POC (<> result, manual entry): 8.6 % (ref 4.0–5.6)
HbA1c, POC (controlled diabetic range): 8.6 % — AB (ref 0.0–7.0)
HbA1c, POC (prediabetic range): 8.6 % — AB (ref 5.7–6.4)
Hemoglobin A1C: 8.6 % — AB (ref 4.0–5.6)

## 2021-12-05 MED ORDER — METOPROLOL SUCCINATE ER 25 MG PO TB24: 25.0000 mg | ORAL_TABLET | Freq: Every day | ORAL | 1 refills | Status: DC

## 2021-12-05 MED ORDER — OXYCONTIN 30 MG PO T12A: 1.0000 | EXTENDED_RELEASE_TABLET | Freq: Two times a day (BID) | ORAL | 0 refills | Status: DC

## 2021-12-05 MED ORDER — BLOOD GLUCOSE MONITOR KIT: PACK | 0 refills | Status: DC

## 2021-12-05 MED ORDER — TRAZODONE HCL 150 MG PO TABS: 150.0000 mg | ORAL_TABLET | Freq: Every evening | ORAL | 0 refills | Status: DC | PRN

## 2021-12-05 MED ORDER — GABAPENTIN 400 MG PO CAPS: 400.0000 mg | ORAL_CAPSULE | Freq: Three times a day (TID) | ORAL | 1 refills | Status: DC

## 2021-12-05 MED ORDER — FINASTERIDE 5 MG PO TABS: 5.0000 mg | ORAL_TABLET | Freq: Every day | ORAL | 2 refills | Status: AC

## 2021-12-05 MED ORDER — TAMSULOSIN HCL 0.4 MG PO CAPS: 0.4000 mg | ORAL_CAPSULE | Freq: Every day | ORAL | 2 refills | Status: AC

## 2021-12-05 MED ORDER — TESTOSTERONE CYPIONATE 200 MG/ML IM SOLN: 200.0000 mg | INTRAMUSCULAR | 0 refills | Status: DC

## 2021-12-05 MED ORDER — AMIODARONE HCL 200 MG PO TABS: 200.0000 mg | ORAL_TABLET | Freq: Every day | ORAL | 1 refills | Status: DC

## 2021-12-05 NOTE — Telephone Encounter (Signed)
Social Worker from [place] calling for verbal orders: ?Social Work Eval requested to be scheduled next week ? ?

## 2021-12-05 NOTE — Progress Notes (Signed)
? ?[facility] ?[place], [place]  [address] ?Phone:  [phone]   Fax:  [phone] ?Subjective:  ? [name], male    DOB: 1965-11-22, 56 y.o.   MRN: [id number] ? ?Chief Complaint  ?Patient presents with  ? Establish Care  ?  Pt is here to establish care. Pt stated his pain level is a 8  ? ?HPI ?[name] 56 y.o. male  has a past medical history of Chronic diastolic CHF (congestive heart failure) ([place]), Complication of anesthesia, Depression, Diabetes mellitus without complication ([place]), and Hypertension. To the [facility] to establish care, recently released from rehab center. States that between hospitalization and rehab, he was admitted for 5 mths.  ? ?Concerned today about foley catheter and discoloration of urine, states that he has had foley catheter since March 10. Home health nurse to come today to replace foley catheter and wound vac machine. Requesting antibiotics for suspected UTI, has notice cloudy and odorous urine in foley bag recently. States that he has not had a UTI  in the past.  ? ?Diabetes Mellitus: Patient presents for follow up of diabetes. Symptoms: none. Patient denies none.  Evaluation to date has been included: hemoglobin A1C.  Home sugars: BGs range between 130 and 190 . Treatment to date: none. Patient states, " I am not taking prescribed insulin, it is too expensive. I am taking OTC insulin from [employer]. You don't need a prescription for it." ? ?When questioned about hypertension, states that he is compliant with all medications, but B/P is elevated because he is in pain.  ? ?Requesting testosterone replacement, states that he has a history of low testosterone. Can not take previously prescribed Androgel, due to cost, requesting prescription for injectable testosterone. Patient requesting refill all medications, including oxycodone. ? ?Denies any other concerns today. Denies any fever. Denies any fatigue, chest pain, shortness of  breath, HA or dizziness. Denies any blurred vision, numbness or tingling. ? ?Past Medical History:  ?Diagnosis Date  ? Chronic diastolic CHF (congestive heart failure) ([place])   ? Complication of anesthesia   ? Depression   ? Diabetes mellitus without complication ([place])   ? Hypertension   ? ? ?Past Surgical History:  ?Procedure Laterality Date  ? AMPUTATION Left 07/23/2021  ? Procedure: LEFT BELOW KNEE AMPUTATION;  Surgeon: [name], MD;  Location: [facility];  Service: Orthopedics;  Laterality: Left;  ? BELOW KNEE LEG AMPUTATION Right   ? BUBBLE STUDY  07/29/2021  ? Procedure: BUBBLE STUDY;  Surgeon: Sueanne Margarita, MD;  Location: [facility];  Service: Cardiovascular;;  ? CARDIOVERSION N/A 07/29/2021  ? Procedure: CARDIOVERSION;  Surgeon: Sueanne Margarita, MD;  Location: [facility];  Service: Cardiovascular;  Laterality: N/A;  ? RADIOLOGY WITH ANESTHESIA N/A 08/05/2021  ? Procedure: MRI LUMBAR WITH AND WITHOUT; THORASIC SPINE WITH AND WITHOUT WITH ANESTHESIA;  Surgeon: Radiologist, Medication, MD;  Location: [facility];  Service: Radiology;  Laterality: N/A;  ? RADIOLOGY WITH ANESTHESIA N/A 08/07/2021  ? Procedure: MRI WITH LUMBER WITH AND WITHOUT CONTRAST,THORACIC WITH AND WITHOUT CONTRAST;  Surgeon: Radiologist, Medication, MD;  Location: [facility];  Service: Radiology;  Laterality: N/A;  ? RADIOLOGY WITH ANESTHESIA N/A 09/13/2021  ? Procedure: MRI WITH ANESTHESIA;  Surgeon: Luanne Bras, MD;  Location: [facility];  Service: Radiology;  Laterality: N/A;  ? TEE WITHOUT CARDIOVERSION N/A 07/29/2021  ? Procedure: TRANSESOPHAGEAL ECHOCARDIOGRAM (TEE);  Surgeon: Sueanne Margarita, MD;  Location: [facility];  Service: Cardiovascular;  Laterality: N/A;  ? ? ?Family History  ?Problem Relation Age of Onset  ? Anxiety disorder Mother   ? Cancer Father   ? Coronary artery disease Father   ? ? ?Social History  ? ?Socioeconomic History  ? Marital status: Divorced  ?  Spouse name: Not on file  ? Number of children: Not on file  ?  Years of education: Not on file  ? Highest education level: Not on file  ?Occupational History  ? Not on file  ?Tobacco Use  ? Smoking status: Never  ? Smokeless tobacco: Never  ?Vaping Use  ? Vaping Use: Never used  ?Substance and Sexual Activity  ? Alcohol use: Never  ? Drug use: Never  ? Sexual activity: Yes  ?Other Topics Concern  ? Not on file  ?Social History Narrative  ? Not on file  ? ?Social Determinants of Health  ? ?Financial Resource Strain: Not on file  ?Food Insecurity: Not on file  ?Transportation Needs: Not on file  ?Physical Activity: Not on file  ?Stress: Not on file  ?Social Connections: Not on file  ?Intimate Partner Violence: Not on file  ? ? ?Outpatient Medications Prior to Visit  ?Medication Sig Dispense Refill  ? Insulin Pen Needle 32G X 4 MM MISC Use as directed 100 each 1  ? protein supplement shake (PREMIER PROTEIN) LIQD Take 2 oz by mouth 3 (three) times daily with meals.    ? insulin aspart (NOVOLOG) 100 UNIT/ML injection Inject 6 Units into the skin 3 (three) times daily with meals. (Patient taking differently: Inject 0-12 Units into the skin 3 (three) times daily with meals.) 10 mL 0  ? insulin glargine (LANTUS SOLOSTAR) 100 UNIT/ML Solostar Pen Inject 36 Units into the skin 2 (two) times daily. (Patient not taking: Reported on 12/05/2021) 15 mL 1  ? SANTYL ointment SMARTSIG:3 Topical 3 Times Daily (Patient not taking: Reported on 12/05/2021)    ? amiodarone (PACERONE) 200 MG tablet Take 1 tablet (200 mg total) by mouth daily. 30 tablet 1  ? apixaban (ELIQUIS) 5 MG TABS tablet Take 1 tablet (5 mg total) by mouth 2 (two) times daily. 60 tablet 1  ? finasteride (PROSCAR) 5 MG tablet Take 5 mg by mouth daily.    ? gabapentin (NEURONTIN) 400 MG capsule Take 1 capsule (400 mg total) by mouth 3 (three) times daily. 90 capsule 1  ? HYDROmorphone (DILAUDID) 4 MG tablet Take 1 tablet (4 mg total) by mouth every 6 (six) hours as needed for severe pain. (Patient not taking: Reported on 09/30/2021)  30 tablet 0  ? metoprolol succinate (TOPROL-XL) 25 MG 24 hr tablet Take 1 tablet (25 mg total) by mouth daily. 30 tablet 1  ? mirtazapine (REMERON) 30 MG tablet Take 30 mg by mouth at bedtime.    ? OXYCONTIN 30 MG 12 hr tablet Take 1 tablet by mouth 2 (two) times daily.    ? tamsulosin (FLOMAX) 0.4 MG CAPS capsule Take 0.4 mg by mouth daily.    ? testosterone (ANDROGEL) 50 MG/5GM (1%) GEL Place 5 g onto the skin daily. (Patient not taking: Reported on 09/30/2021) 150 g 0  ? traZODone (DESYREL) 150 MG tablet Take 1 tablet (150 mg total) by mouth at bedtime as needed for sleep. (Patient not taking: Reported on 12/05/2021) 15 tablet 0  ? ?No facility-administered medications prior to visit.  ? ? ?Allergies  ?Allergen Reactions  ? Bactrim [Sulfamethoxazole-Trimethoprim]   ? Ceprotin [Protein C Concentrate (Human)]   ?  Ciprofloxacin Other (See Comments)  ?  Kidney function  ? Levaquin [Levofloxacin]   ? ? ?Review of Systems  ?Constitutional:  Negative for chills, fever and malaise/fatigue.  ?HENT: Negative.    ?Eyes: Negative.   ?Respiratory:  Negative for cough and shortness of breath.   ?Cardiovascular:  Negative for chest pain, palpitations and leg swelling.  ?Gastrointestinal:  Negative for abdominal pain, blood in stool, constipation, diarrhea, nausea and vomiting.  ?Genitourinary:  Negative for dysuria, flank pain, frequency, hematuria and urgency.  ?     See HPI  ?Musculoskeletal:  Positive for back pain. Negative for falls, joint pain, myalgias and neck pain.  ?Skin: Negative.   ?Neurological: Negative.   ?Psychiatric/Behavioral:  Negative for depression. The patient is not nervous/anxious.   ?All other systems reviewed and are negative. ? ?   ?Objective:  ?  ?Physical Exam ?Constitutional:   ?   General: He is not in acute distress. ?   Appearance: Normal appearance. He is normal weight.  ?HENT:  ?   Head: Normocephalic.  ?   Right Ear: Tympanic membrane, ear canal and external ear normal. There is no impacted  cerumen.  ?   Left Ear: Tympanic membrane, ear canal and external ear normal. There is no impacted cerumen.  ?   Nose: Nose normal. No congestion or rhinorrhea.  ?   Mouth/Throat:  ?   Mouth: Mucous membranes

## 2021-12-05 NOTE — Telephone Encounter (Signed)
Pt called requesting the Antibiotics for UTI and meter with test strips and "prick" ?

## 2021-12-05 NOTE — Patient Instructions (Signed)
You were seen today in the [facility] for to establish care and for possible UTI. Labs were collected, results will be available via MyChart or, if abnormal, you will be contacted by clinic staff. You were prescribed medications, please take as directed. Please follow up in 2 mths for reevaluation of symptoms. ?

## 2021-12-06 LAB — COMPREHENSIVE METABOLIC PANEL
ALT: 33 IU/L (ref 0–44)
AST: 22 IU/L (ref 0–40)
Albumin/Globulin Ratio: 1.3 (ref 1.2–2.2)
Albumin: 4 g/dL (ref 3.8–4.9)
Alkaline Phosphatase: 140 IU/L — ABNORMAL HIGH (ref 44–121)
BUN/Creatinine Ratio: 19 (ref 9–20)
BUN: 19 mg/dL (ref 6–24)
Bilirubin Total: 0.4 mg/dL (ref 0.0–1.2)
CO2: 22 mmol/L (ref 20–29)
Calcium: 9.3 mg/dL (ref 8.7–10.2)
Chloride: 98 mmol/L (ref 96–106)
Creatinine, Ser: 1.01 mg/dL (ref 0.76–1.27)
Globulin, Total: 3 g/dL (ref 1.5–4.5)
Glucose: 435 mg/dL — ABNORMAL HIGH (ref 70–99)
Potassium: 4.2 mmol/L (ref 3.5–5.2)
Sodium: 137 mmol/L (ref 134–144)
Total Protein: 7 g/dL (ref 6.0–8.5)
eGFR: 88 mL/min/{1.73_m2} (ref >59)

## 2021-12-06 LAB — CBC WITH DIFFERENTIAL/PLATELET
Basophils Absolute: 0 10*3/uL (ref 0.0–0.2)
Basos: 1 %
EOS (ABSOLUTE): 0.1 10*3/uL (ref 0.0–0.4)
Eos: 2 %
Hematocrit: 49.1 % (ref 37.5–51.0)
Hemoglobin: 15.8 g/dL (ref 13.0–17.7)
Immature Grans (Abs): 0 10*3/uL (ref 0.0–0.1)
Immature Granulocytes: 1 %
Lymphocytes Absolute: 0.7 10*3/uL (ref 0.7–3.1)
Lymphs: 11 %
MCH: 28 pg (ref 26.6–33.0)
MCHC: 32.2 g/dL (ref 31.5–35.7)
MCV: 87 fL (ref 79–97)
Monocytes Absolute: 0.5 10*3/uL (ref 0.1–0.9)
Monocytes: 9 %
Neutrophils Absolute: 4.9 10*3/uL (ref 1.4–7.0)
Neutrophils: 76 %
Platelets: 311 10*3/uL (ref 150–450)
RBC: 5.65 x10E6/uL (ref 4.14–5.80)
RDW: 15 % (ref 11.6–15.4)
WBC: 6.3 10*3/uL (ref 3.4–10.8)

## 2021-12-06 LAB — LIPID PANEL
Chol/HDL Ratio: 4.7 ratio (ref 0.0–5.0)
Cholesterol, Total: 183 mg/dL (ref 100–199)
HDL: 39 mg/dL — ABNORMAL LOW (ref >39)
LDL Chol Calc (NIH): 121 mg/dL — ABNORMAL HIGH (ref 0–99)
Triglycerides: 127 mg/dL (ref 0–149)
VLDL Cholesterol Cal: 23 mg/dL (ref 5–40)

## 2021-12-06 LAB — TESTOSTERONE: Testosterone: 94 ng/dL — ABNORMAL LOW (ref 264–916)

## 2021-12-08 ENCOUNTER — Telehealth: Payer: Self-pay | Admitting: Nurse Practitioner

## 2021-12-08 ENCOUNTER — Encounter: Payer: Self-pay | Admitting: Nurse Practitioner

## 2021-12-08 NOTE — Telephone Encounter (Signed)
PT needs prior authorization for Oxycontin from [facility]  ?Requesting to be called when his medicine is sent in  ?

## 2021-12-08 NOTE — Telephone Encounter (Signed)
Request [facility] to replace geenodine. Having too may problems with the geenodine.  ?

## 2021-12-09 ENCOUNTER — Other Ambulatory Visit: Payer: Self-pay | Admitting: Nurse Practitioner

## 2021-12-09 DIAGNOSIS — G894 Chronic pain syndrome: Secondary | ICD-10-CM

## 2021-12-09 MED ORDER — XTAMPZA ER 27 MG PO C12A: 1.0000 | EXTENDED_RELEASE_CAPSULE | Freq: Two times a day (BID) | ORAL | 0 refills | Status: DC | PRN

## 2021-12-10 ENCOUNTER — Telehealth: Payer: Self-pay | Admitting: Nurse Practitioner

## 2021-12-10 NOTE — Telephone Encounter (Signed)
Patient requesting [name] call him. He is requesting authorization for his home health [facility] to put a hospital bed in his house.  ?Also requesting order for his home health nurse to give testosterone shot at home.  ?

## 2021-12-15 ENCOUNTER — Emergency Department (HOSPITAL_COMMUNITY): Payer: Medicare HMO

## 2021-12-15 ENCOUNTER — Other Ambulatory Visit: Payer: Self-pay

## 2021-12-15 ENCOUNTER — Inpatient Hospital Stay (HOSPITAL_COMMUNITY)
Admission: EM | Admit: 2021-12-15 | Discharge: 2021-12-18 | DRG: 177 | Disposition: A | Discharge status: Home Health | Payer: Medicare HMO | Attending: Internal Medicine | Admitting: Internal Medicine

## 2021-12-15 DIAGNOSIS — I110 Hypertensive heart disease with heart failure: Secondary | ICD-10-CM | POA: Diagnosis present

## 2021-12-15 DIAGNOSIS — Z20822 Contact with and (suspected) exposure to covid-19: Secondary | ICD-10-CM | POA: Diagnosis not present

## 2021-12-15 DIAGNOSIS — I5042 Chronic combined systolic (congestive) and diastolic (congestive) heart failure: Secondary | ICD-10-CM | POA: Diagnosis not present

## 2021-12-15 DIAGNOSIS — D72829 Elevated white blood cell count, unspecified: Secondary | ICD-10-CM | POA: Diagnosis present

## 2021-12-15 DIAGNOSIS — J9811 Atelectasis: Secondary | ICD-10-CM | POA: Diagnosis not present

## 2021-12-15 DIAGNOSIS — Z794 Long term (current) use of insulin: Secondary | ICD-10-CM | POA: Diagnosis not present

## 2021-12-15 DIAGNOSIS — M4628 Osteomyelitis of vertebra, sacral and sacrococcygeal region: Secondary | ICD-10-CM

## 2021-12-15 DIAGNOSIS — E872 Acidosis, unspecified: Secondary | ICD-10-CM

## 2021-12-15 DIAGNOSIS — Z8249 Family history of ischemic heart disease and other diseases of the circulatory system: Secondary | ICD-10-CM

## 2021-12-15 DIAGNOSIS — L89154 Pressure ulcer of sacral region, stage 4: Secondary | ICD-10-CM | POA: Diagnosis present

## 2021-12-15 DIAGNOSIS — J9601 Acute respiratory failure with hypoxia: Secondary | ICD-10-CM | POA: Diagnosis not present

## 2021-12-15 DIAGNOSIS — T83511A Infection and inflammatory reaction due to indwelling urethral catheter, initial encounter: Secondary | ICD-10-CM | POA: Diagnosis present

## 2021-12-15 DIAGNOSIS — N281 Cyst of kidney, acquired: Secondary | ICD-10-CM | POA: Diagnosis present

## 2021-12-15 DIAGNOSIS — R4189 Other symptoms and signs involving cognitive functions and awareness: Secondary | ICD-10-CM

## 2021-12-15 DIAGNOSIS — N3289 Other specified disorders of bladder: Secondary | ICD-10-CM | POA: Diagnosis not present

## 2021-12-15 DIAGNOSIS — Z89511 Acquired absence of right leg below knee: Secondary | ICD-10-CM | POA: Diagnosis not present

## 2021-12-15 DIAGNOSIS — L89159 Pressure ulcer of sacral region, unspecified stage: Secondary | ICD-10-CM | POA: Diagnosis present

## 2021-12-15 DIAGNOSIS — I517 Cardiomegaly: Secondary | ICD-10-CM | POA: Diagnosis not present

## 2021-12-15 DIAGNOSIS — R739 Hyperglycemia, unspecified: Secondary | ICD-10-CM | POA: Diagnosis not present

## 2021-12-15 DIAGNOSIS — R079 Chest pain, unspecified: Secondary | ICD-10-CM | POA: Diagnosis present

## 2021-12-15 DIAGNOSIS — K4090 Unilateral inguinal hernia, without obstruction or gangrene, not specified as recurrent: Secondary | ICD-10-CM | POA: Diagnosis not present

## 2021-12-15 DIAGNOSIS — I4892 Unspecified atrial flutter: Secondary | ICD-10-CM | POA: Diagnosis not present

## 2021-12-15 DIAGNOSIS — E1165 Type 2 diabetes mellitus with hyperglycemia: Secondary | ICD-10-CM | POA: Diagnosis not present

## 2021-12-15 DIAGNOSIS — M549 Dorsalgia, unspecified: Secondary | ICD-10-CM | POA: Diagnosis not present

## 2021-12-15 DIAGNOSIS — F5101 Primary insomnia: Secondary | ICD-10-CM

## 2021-12-15 DIAGNOSIS — Z809 Family history of malignant neoplasm, unspecified: Secondary | ICD-10-CM | POA: Diagnosis not present

## 2021-12-15 DIAGNOSIS — G894 Chronic pain syndrome: Secondary | ICD-10-CM

## 2021-12-15 DIAGNOSIS — R404 Transient alteration of awareness: Secondary | ICD-10-CM | POA: Diagnosis not present

## 2021-12-15 DIAGNOSIS — N390 Urinary tract infection, site not specified: Secondary | ICD-10-CM

## 2021-12-15 DIAGNOSIS — Z818 Family history of other mental and behavioral disorders: Secondary | ICD-10-CM

## 2021-12-15 DIAGNOSIS — J690 Pneumonitis due to inhalation of food and vomit: Secondary | ICD-10-CM | POA: Diagnosis not present

## 2021-12-15 DIAGNOSIS — Z79899 Other long term (current) drug therapy: Secondary | ICD-10-CM | POA: Diagnosis not present

## 2021-12-15 DIAGNOSIS — G8929 Other chronic pain: Secondary | ICD-10-CM | POA: Diagnosis not present

## 2021-12-15 DIAGNOSIS — R4182 Altered mental status, unspecified: Secondary | ICD-10-CM | POA: Diagnosis not present

## 2021-12-15 DIAGNOSIS — N2889 Other specified disorders of kidney and ureter: Secondary | ICD-10-CM | POA: Diagnosis present

## 2021-12-15 DIAGNOSIS — I10 Essential (primary) hypertension: Secondary | ICD-10-CM | POA: Diagnosis not present

## 2021-12-15 DIAGNOSIS — Y846 Urinary catheterization as the cause of abnormal reaction of the patient, or of later complication, without mention of misadventure at the time of the procedure: Secondary | ICD-10-CM | POA: Diagnosis present

## 2021-12-15 DIAGNOSIS — R0902 Hypoxemia: Secondary | ICD-10-CM

## 2021-12-15 DIAGNOSIS — T50904A Poisoning by unspecified drugs, medicaments and biological substances, undetermined, initial encounter: Secondary | ICD-10-CM | POA: Diagnosis not present

## 2021-12-15 DIAGNOSIS — R109 Unspecified abdominal pain: Secondary | ICD-10-CM | POA: Diagnosis not present

## 2021-12-15 DIAGNOSIS — Z89512 Acquired absence of left leg below knee: Secondary | ICD-10-CM

## 2021-12-15 DIAGNOSIS — I700 Atherosclerosis of aorta: Secondary | ICD-10-CM | POA: Diagnosis not present

## 2021-12-15 DIAGNOSIS — R4020 Unspecified coma: Secondary | ICD-10-CM | POA: Diagnosis not present

## 2021-12-15 DIAGNOSIS — M545 Low back pain, unspecified: Secondary | ICD-10-CM | POA: Diagnosis not present

## 2021-12-15 DIAGNOSIS — M48061 Spinal stenosis, lumbar region without neurogenic claudication: Secondary | ICD-10-CM | POA: Diagnosis not present

## 2021-12-15 HISTORY — DX: Acidosis, unspecified: E87.20

## 2021-12-15 HISTORY — DX: Pressure ulcer of sacral region, unspecified stage: L89.159

## 2021-12-15 HISTORY — DX: Transient alteration of awareness: R40.4

## 2021-12-15 HISTORY — DX: Acute respiratory failure with hypoxia: J96.01

## 2021-12-15 LAB — COMPREHENSIVE METABOLIC PANEL
ALT: 30 U/L (ref 0–44)
AST: 43 U/L — ABNORMAL HIGH (ref 15–41)
Albumin: 2.7 g/dL — ABNORMAL LOW (ref 3.5–5.0)
Alkaline Phosphatase: 84 U/L (ref 38–126)
Anion gap: 8 (ref 5–15)
BUN: 20 mg/dL (ref 6–20)
CO2: 28 mmol/L (ref 22–32)
Calcium: 8.4 mg/dL — ABNORMAL LOW (ref 8.9–10.3)
Chloride: 101 mmol/L (ref 98–111)
Creatinine, Ser: 1.21 mg/dL (ref 0.61–1.24)
GFR, Estimated: >60 mL/min (ref >60)
Glucose, Bld: 347 mg/dL — ABNORMAL HIGH (ref 70–99)
Potassium: 4.1 mmol/L (ref 3.5–5.1)
Sodium: 137 mmol/L (ref 135–145)
Total Bilirubin: 0.8 mg/dL (ref 0.3–1.2)
Total Protein: 5.8 g/dL — ABNORMAL LOW (ref 6.5–8.1)

## 2021-12-15 LAB — URINALYSIS, ROUTINE W REFLEX MICROSCOPIC
Bilirubin Urine: NEGATIVE
Glucose, UA: 150 mg/dL — AB
Ketones, ur: NEGATIVE mg/dL
Nitrite: NEGATIVE
Protein, ur: 100 mg/dL — AB
Specific Gravity, Urine: 1.015 (ref 1.005–1.030)
WBC, UA: >50 WBC/hpf — ABNORMAL HIGH (ref 0–5)
pH: 5 (ref 5.0–8.0)

## 2021-12-15 LAB — I-STAT CHEM 8, ED
BUN: 29 mg/dL — ABNORMAL HIGH (ref 6–20)
Calcium, Ion: 1.12 mmol/L — ABNORMAL LOW (ref 1.15–1.40)
Chloride: 97 mmol/L — ABNORMAL LOW (ref 98–111)
Creatinine, Ser: 1.1 mg/dL (ref 0.61–1.24)
Glucose, Bld: 357 mg/dL — ABNORMAL HIGH (ref 70–99)
HCT: 41 % (ref 39.0–52.0)
Hemoglobin: 13.9 g/dL (ref 13.0–17.0)
Potassium: 4.2 mmol/L (ref 3.5–5.1)
Sodium: 137 mmol/L (ref 135–145)
TCO2: 32 mmol/L (ref 22–32)

## 2021-12-15 LAB — CBC
HCT: 42.2 % (ref 39.0–52.0)
Hemoglobin: 13.5 g/dL (ref 13.0–17.0)
MCH: 29.2 pg (ref 26.0–34.0)
MCHC: 32 g/dL (ref 30.0–36.0)
MCV: 91.3 fL (ref 80.0–100.0)
Platelets: 271 10*3/uL (ref 150–400)
RBC: 4.62 MIL/uL (ref 4.22–5.81)
RDW: 14.5 % (ref 11.5–15.5)
WBC: 10.8 10*3/uL — ABNORMAL HIGH (ref 4.0–10.5)
nRBC: 0 % (ref 0.0–0.2)

## 2021-12-15 LAB — ETHANOL: Alcohol, Ethyl (B): <10 mg/dL (ref <10)

## 2021-12-15 LAB — RESP PANEL BY RT-PCR (FLU A&B, COVID) ARPGX2
Influenza A by PCR: NEGATIVE
Influenza B by PCR: NEGATIVE
SARS Coronavirus 2 by RT PCR: NEGATIVE

## 2021-12-15 LAB — BLOOD GAS, VENOUS
Acid-Base Excess: 3.3 mmol/L — ABNORMAL HIGH (ref 0.0–2.0)
Bicarbonate: 27.8 mmol/L (ref 20.0–28.0)
Drawn by: 63960
O2 Saturation: 93.1 %
Patient temperature: 37
pCO2, Ven: 41 mmHg — ABNORMAL LOW (ref 44–60)
pH, Ven: 7.44 — ABNORMAL HIGH (ref 7.25–7.43)
pO2, Ven: 61 mmHg — ABNORMAL HIGH (ref 32–45)

## 2021-12-15 LAB — BRAIN NATRIURETIC PEPTIDE: B Natriuretic Peptide: 70 pg/mL (ref 0.0–100.0)

## 2021-12-15 LAB — TROPONIN I (HIGH SENSITIVITY)
Troponin I (High Sensitivity): 14 ng/L (ref <18)
Troponin I (High Sensitivity): 19 ng/L — ABNORMAL HIGH (ref <18)

## 2021-12-15 LAB — LACTIC ACID, PLASMA
Lactic Acid, Venous: 2.8 mmol/L (ref 0.5–1.9)
Lactic Acid, Venous: 2.1 mmol/L (ref 0.5–1.9)

## 2021-12-15 LAB — RAPID URINE DRUG SCREEN, HOSP PERFORMED
Amphetamines: NOT DETECTED
Barbiturates: NOT DETECTED
Benzodiazepines: NOT DETECTED
Cocaine: NOT DETECTED
Opiates: NOT DETECTED
Tetrahydrocannabinol: NOT DETECTED

## 2021-12-15 LAB — SEDIMENTATION RATE: Sed Rate: 30 mm/hr — ABNORMAL HIGH (ref 0–16)

## 2021-12-15 LAB — D-DIMER, QUANTITATIVE: D-Dimer, Quant: 2.7 ug/mL-FEU — ABNORMAL HIGH (ref 0.00–0.50)

## 2021-12-15 LAB — GLUCOSE, CAPILLARY: Glucose-Capillary: 275 mg/dL — ABNORMAL HIGH (ref 70–99)

## 2021-12-15 LAB — PROCALCITONIN: Procalcitonin: 0.16 ng/mL

## 2021-12-15 LAB — C-REACTIVE PROTEIN: CRP: 0.9 mg/dL (ref <1.0)

## 2021-12-15 LAB — CBG MONITORING, ED: Glucose-Capillary: 230 mg/dL — ABNORMAL HIGH (ref 70–99)

## 2021-12-15 IMAGING — CT CT L SPINE W/O CM
2 of 3 series · 11 of 33 positions shown, 13 images · IV contrast (APPLIED)
Comparison: Lumbar spine MRI [DATE]

CLINICAL DATA: Abdominal pain.

EXAM:
CT LUMBAR SPINE WITH CONTRAST
TECHNIQUE: 
TECHNIQUE: Multiplanar CT images of the lumbar spine were
reconstructed from contemporary CT of the Abdomen and Pelvis.
RADIATION DOSE REDUCTION: This exam was performed according to the
departmental dose-optimization program which includes automated
exposure control, adjustment of the mA and/or kV according to
patient size and/or use of iterative reconstruction technique.
CONTRAST:  No additional

[Series 9: axial lspine · axial · 0.30mm/px · z∈[+738,+1064]mm · 8 of 193 slices shown, 10 images]
[im 15/193  soft-tissue]
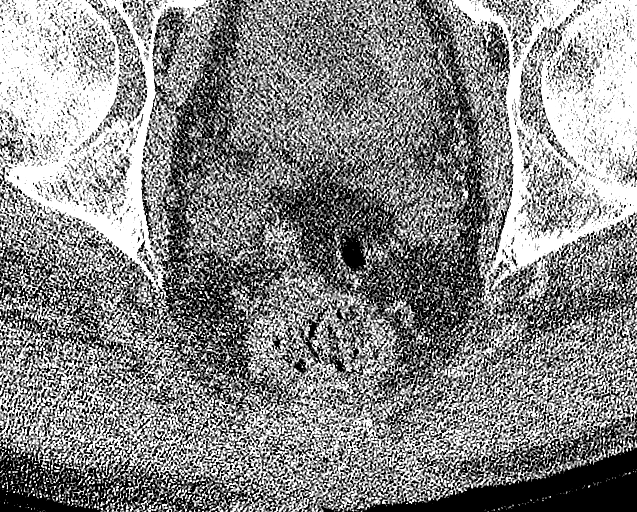
[im 15/193  bone]
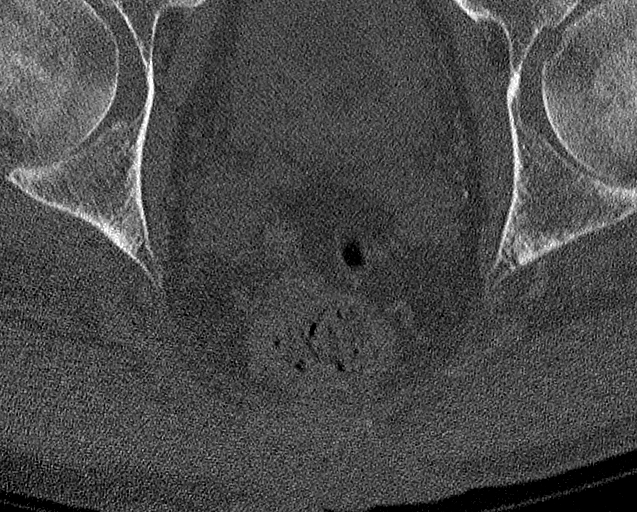
[im 45/193  bone]
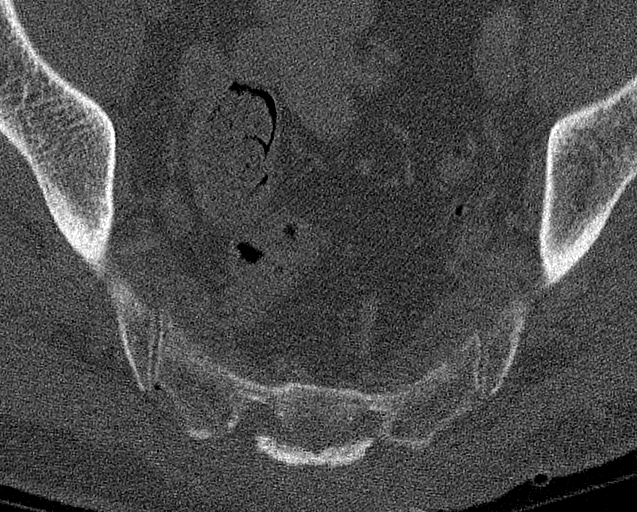
[im 60/193  bone]
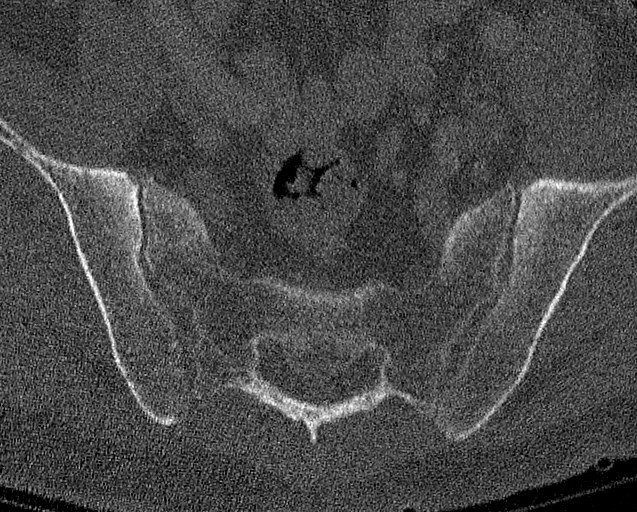
[im 89/193  bone]
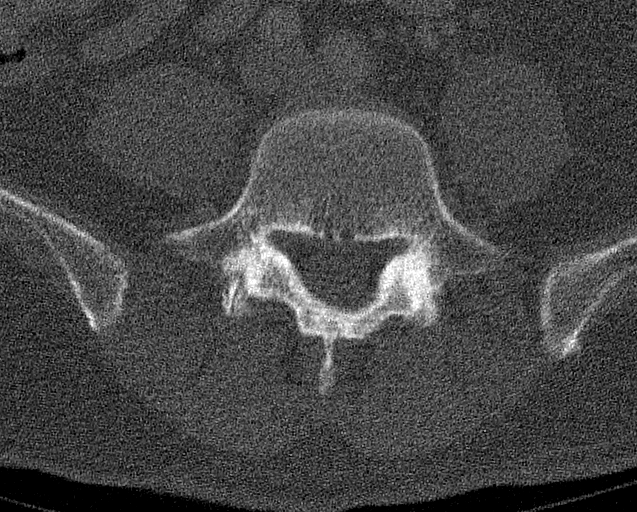
[im 104/193  soft-tissue]
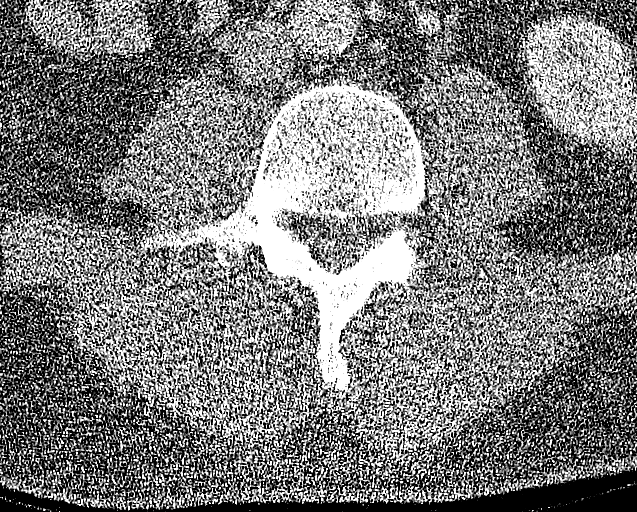
[im 104/193  bone]
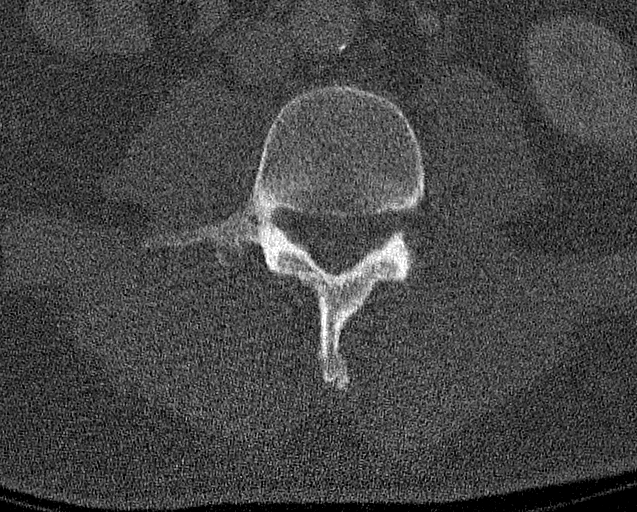
[im 133/193  bone]
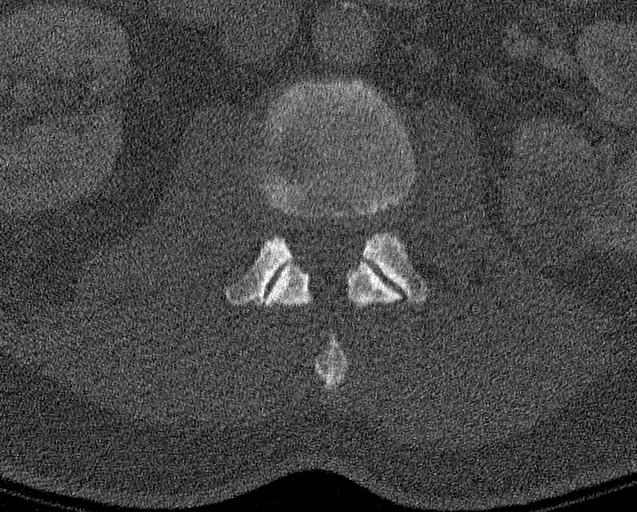
[im 148/193  bone]
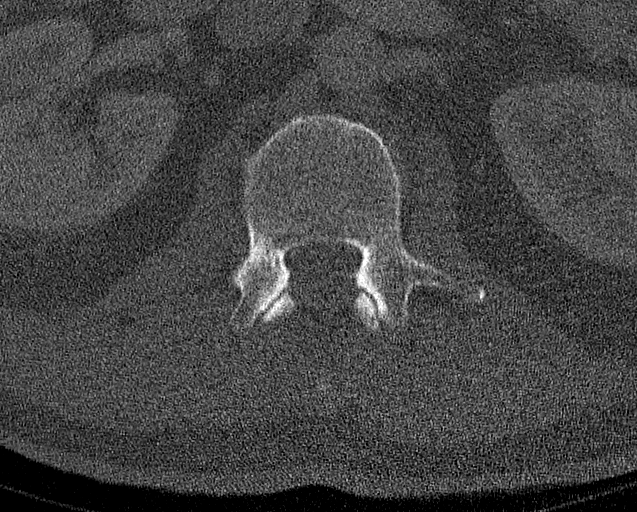
[im 178/193  bone]
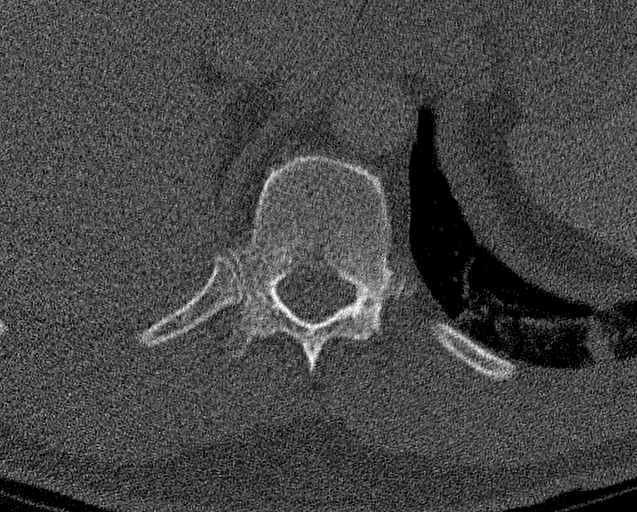

[Series 11: cor lspine · coronal · 0.38mm/px · 3 of 79 slices shown]
[im 16/79  bone]
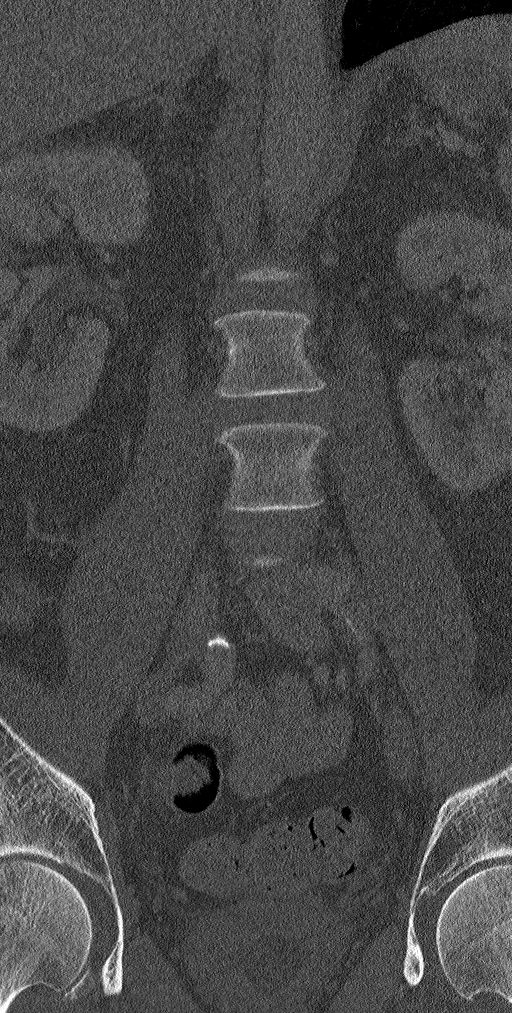
[im 32/79  bone]
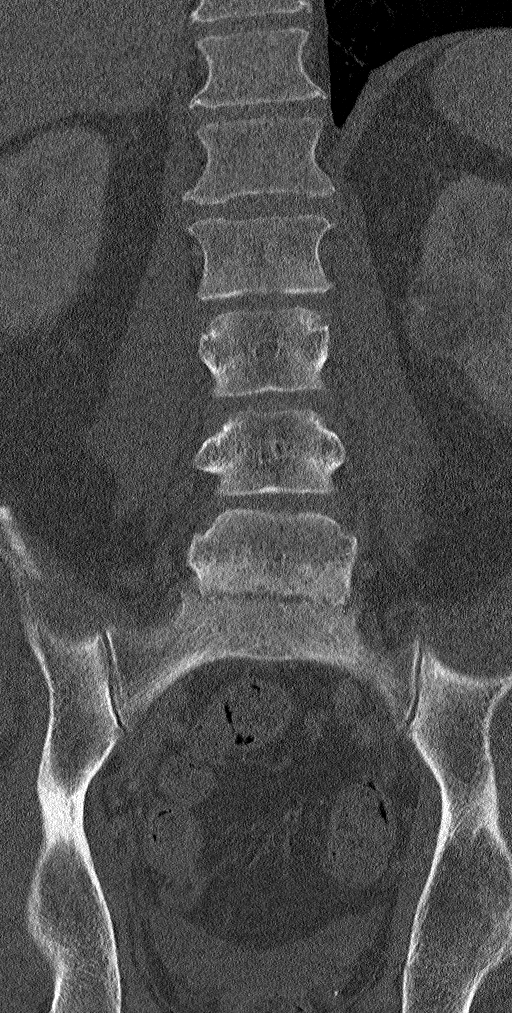
[im 47/79  bone]
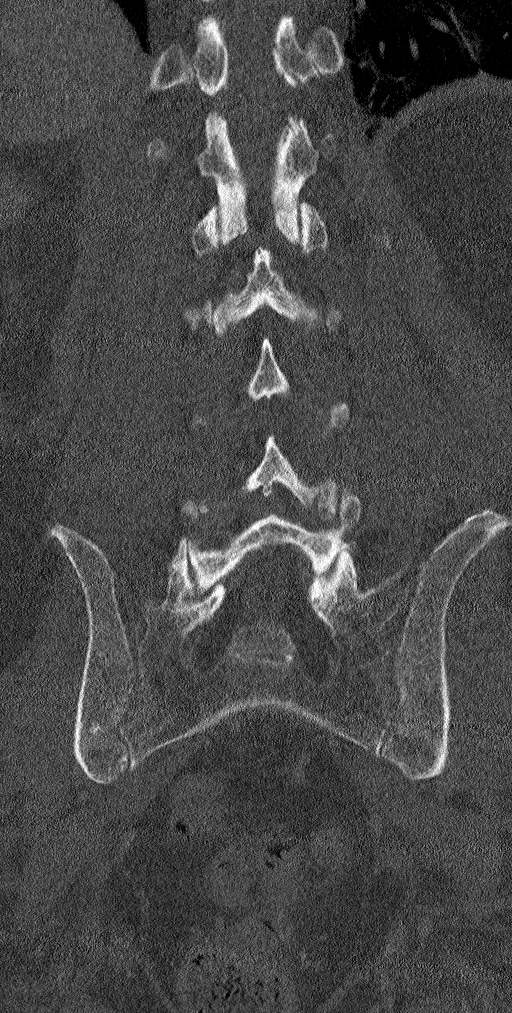

[11 of 33 positions shown; findings below may reference images not displayed]

FINDINGS: Segmentation: Standard.

Alignment: No significant listhesis.

Vertebrae: No acute fracture or suspicious osseous lesion. Prominent
degenerative endplate sclerosis at L5-S1 associated with severe disc
space narrowing.

Paraspinal and other soft tissues: No acute abnormality identified
in the paraspinal soft tissues. Intra-abdominal and pelvic contents
reported separately.

Disc levels: Advanced disc degeneration and mild-to-moderate facet
arthrosis at L5-S1 resulting in moderate right greater than left
neural foraminal stenosis, similar to the prior MRI. Unchanged mild
bilateral neural foraminal stenosis at L4-5 due to disc bulging and
left greater than right facet arthrosis. No high-grade spinal canal
stenosis.
IMPRESSION: 1. No acute osseous abnormality in the lumbar spine.
2. Unchanged advanced disc degeneration at L5-S1 with moderate
neural foraminal stenosis.
3. Unchanged mild neural foraminal stenosis at L4-5.

## 2021-12-15 IMAGING — DX DG CHEST 1V PORT
1 series · 1 of 1 positions shown · non-contrast
Comparison: [DATE].

CLINICAL DATA: Hypoxia.

EXAM:
PORTABLE CHEST 1 VIEW

[chest ap]
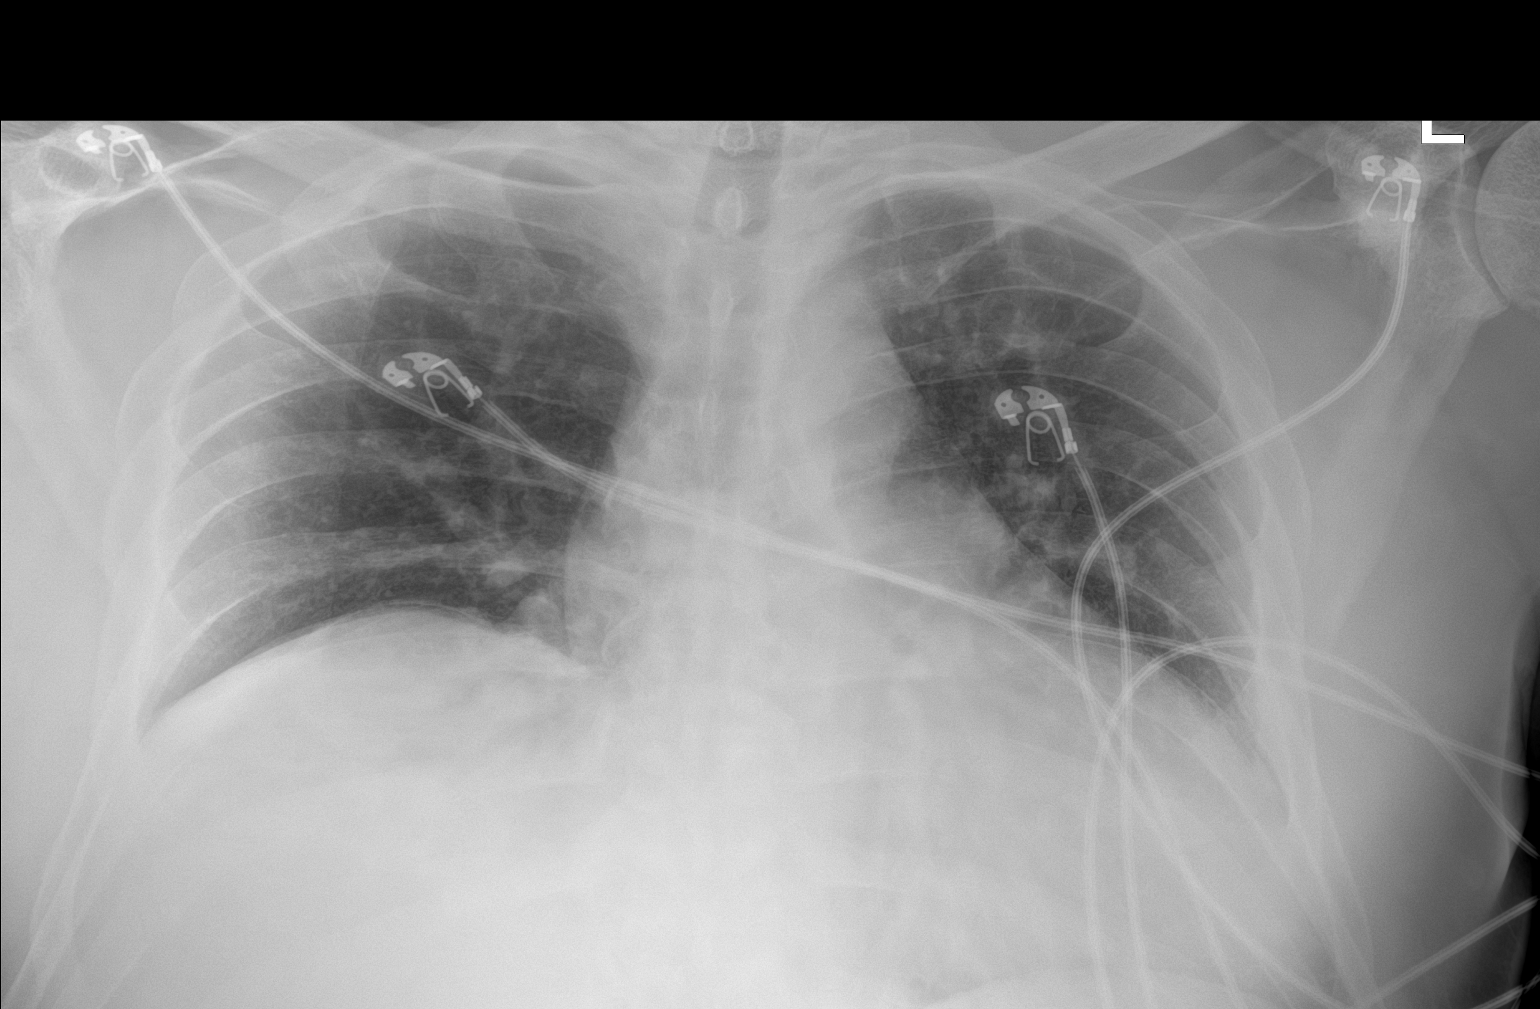

[1 of 1 positions shown; findings below may reference images not displayed]

FINDINGS: Stable cardiomegaly. Left lung is clear. Elevated right
hemidiaphragm is noted with minimal right basilar subsegmental
atelectasis. The visualized skeletal structures are unremarkable.
IMPRESSION: Elevated right hemidiaphragm with minimal right basilar subsegmental
atelectasis.

## 2021-12-15 MED ORDER — GABAPENTIN 400 MG PO CAPS
400.0000 mg | ORAL_CAPSULE | Freq: Three times a day (TID) | ORAL | Status: DC
  Administered 2021-12-15 – 2021-12-18 (×9): 400 mg via ORAL
  Filled 2021-12-15 (×9): qty 1

## 2021-12-15 MED ORDER — OXYCODONE HCL ER 10 MG PO T12A: 20.0000 mg | EXTENDED_RELEASE_TABLET | Freq: Two times a day (BID) | ORAL | Status: DC

## 2021-12-15 MED ORDER — INSULIN GLARGINE-YFGN 100 UNIT/ML ~~LOC~~ SOLN
20.0000 [IU] | Freq: Two times a day (BID) | SUBCUTANEOUS | Status: DC
  Administered 2021-12-15 – 2021-12-18 (×6): 20 [IU] via SUBCUTANEOUS
  Filled 2021-12-15 (×7): qty 0.2

## 2021-12-15 MED ORDER — LACTATED RINGERS IV SOLN: INTRAVENOUS | Status: AC

## 2021-12-15 MED ORDER — OXYCODONE HCL ER 10 MG PO T12A
20.0000 mg | EXTENDED_RELEASE_TABLET | Freq: Two times a day (BID) | ORAL | Status: DC
  Administered 2021-12-15 – 2021-12-18 (×6): 20 mg via ORAL
  Filled 2021-12-15 (×6): qty 2

## 2021-12-15 MED ORDER — ONDANSETRON HCL 4 MG/2ML IJ SOLN
4.0000 mg | Freq: Once | INTRAMUSCULAR | Status: AC
  Administered 2021-12-15: 4 mg via INTRAVENOUS
  Filled 2021-12-15: qty 2

## 2021-12-15 MED ORDER — TRAZODONE HCL 50 MG PO TABS
150.0000 mg | ORAL_TABLET | Freq: Every day | ORAL | Status: DC
  Administered 2021-12-15 – 2021-12-17 (×3): 150 mg via ORAL
  Filled 2021-12-15 (×3): qty 3

## 2021-12-15 MED ORDER — HYDROMORPHONE HCL 1 MG/ML IJ SOLN
0.5000 mg | INTRAMUSCULAR | Status: AC | PRN
  Administered 2021-12-15 (×2): 0.5 mg via INTRAVENOUS
  Filled 2021-12-15: qty 0.5
  Filled 2021-12-15: qty 1

## 2021-12-15 MED ORDER — ENSURE MAX PROTEIN PO LIQD
11.0000 [oz_av] | Freq: Three times a day (TID) | ORAL | Status: DC
  Filled 2021-12-15: qty 330

## 2021-12-15 MED ORDER — PREMIER PROTEIN SHAKE: 2.0000 [oz_av] | Freq: Three times a day (TID) | ORAL | Status: DC

## 2021-12-15 MED ORDER — FUROSEMIDE 40 MG PO TABS
40.0000 mg | ORAL_TABLET | Freq: Every day | ORAL | Status: DC
  Administered 2021-12-16 – 2021-12-18 (×3): 40 mg via ORAL
  Filled 2021-12-15 (×3): qty 1

## 2021-12-15 MED ORDER — IOHEXOL 300 MG/ML  SOLN
100.0000 mL | Freq: Once | INTRAMUSCULAR | Status: AC | PRN
  Administered 2021-12-15: 100 mL via INTRAVENOUS

## 2021-12-15 MED ORDER — SODIUM CHLORIDE 0.9% FLUSH
3.0000 mL | Freq: Two times a day (BID) | INTRAVENOUS | Status: DC
  Administered 2021-12-15 – 2021-12-18 (×7): 3 mL via INTRAVENOUS

## 2021-12-15 MED ORDER — TAMSULOSIN HCL 0.4 MG PO CAPS
0.4000 mg | ORAL_CAPSULE | Freq: Every day | ORAL | Status: DC
  Administered 2021-12-15 – 2021-12-18 (×4): 0.4 mg via ORAL
  Filled 2021-12-15 (×4): qty 1

## 2021-12-15 MED ORDER — SODIUM CHLORIDE 0.9 % IV SOLN
2.0000 g | Freq: Once | INTRAVENOUS | Status: AC
  Administered 2021-12-15: 2 g via INTRAVENOUS
  Filled 2021-12-15: qty 12.5

## 2021-12-15 MED ORDER — VANCOMYCIN HCL 1500 MG/300ML IV SOLN
1500.0000 mg | Freq: Once | INTRAVENOUS | Status: AC
  Administered 2021-12-15: 1500 mg via INTRAVENOUS
  Filled 2021-12-15: qty 300

## 2021-12-15 MED ORDER — INSULIN ASPART 100 UNIT/ML IJ SOLN
0.0000 [IU] | Freq: Three times a day (TID) | INTRAMUSCULAR | Status: DC
  Administered 2021-12-15 – 2021-12-16 (×3): 5 [IU] via SUBCUTANEOUS
  Administered 2021-12-16: 8 [IU] via SUBCUTANEOUS
  Administered 2021-12-17: 3 [IU] via SUBCUTANEOUS
  Administered 2021-12-17 – 2021-12-18 (×4): 5 [IU] via SUBCUTANEOUS
  Administered 2021-12-18: 2 [IU] via SUBCUTANEOUS

## 2021-12-15 MED ORDER — ACETAMINOPHEN 650 MG RE SUPP
650.0000 mg | Freq: Four times a day (QID) | RECTAL | Status: DC | PRN
Start: 2021-12-15 — End: 2021-12-18

## 2021-12-15 MED ORDER — SODIUM CHLORIDE 0.9 % IV SOLN
2.0000 g | Freq: Three times a day (TID) | INTRAVENOUS | Status: DC
  Administered 2021-12-15 – 2021-12-18 (×9): 2 g via INTRAVENOUS
  Filled 2021-12-15 (×9): qty 12.5

## 2021-12-15 MED ORDER — ACETAMINOPHEN 325 MG PO TABS
650.0000 mg | ORAL_TABLET | Freq: Four times a day (QID) | ORAL | Status: DC | PRN
  Administered 2021-12-18: 650 mg via ORAL
  Filled 2021-12-15: qty 2

## 2021-12-15 MED ORDER — FINASTERIDE 5 MG PO TABS
5.0000 mg | ORAL_TABLET | Freq: Every day | ORAL | Status: DC
  Administered 2021-12-16 – 2021-12-18 (×3): 5 mg via ORAL
  Filled 2021-12-15 (×3): qty 1

## 2021-12-15 MED ORDER — AMIODARONE HCL 200 MG PO TABS
200.0000 mg | ORAL_TABLET | Freq: Every day | ORAL | Status: DC
  Administered 2021-12-16 – 2021-12-18 (×3): 200 mg via ORAL
  Filled 2021-12-15 (×3): qty 1

## 2021-12-15 MED ORDER — METOPROLOL SUCCINATE ER 25 MG PO TB24
25.0000 mg | ORAL_TABLET | Freq: Every day | ORAL | Status: DC
  Administered 2021-12-16 – 2021-12-18 (×3): 25 mg via ORAL
  Filled 2021-12-15 (×3): qty 1

## 2021-12-15 MED ORDER — ALBUTEROL SULFATE (2.5 MG/3ML) 0.083% IN NEBU: 2.5000 mg | INHALATION_SOLUTION | Freq: Four times a day (QID) | RESPIRATORY_TRACT | Status: DC | PRN

## 2021-12-15 MED ORDER — METRONIDAZOLE 500 MG/100ML IV SOLN
500.0000 mg | Freq: Once | INTRAVENOUS | Status: AC
  Administered 2021-12-15: 500 mg via INTRAVENOUS
  Filled 2021-12-15: qty 100

## 2021-12-15 MED ORDER — VANCOMYCIN HCL 1250 MG/250ML IV SOLN
1250.0000 mg | Freq: Two times a day (BID) | INTRAVENOUS | Status: DC
  Administered 2021-12-16 – 2021-12-17 (×3): 1250 mg via INTRAVENOUS
  Filled 2021-12-15 (×3): qty 250

## 2021-12-15 MED ORDER — LACTATED RINGERS IV BOLUS (SEPSIS)
1000.0000 mL | Freq: Once | INTRAVENOUS | Status: AC
  Administered 2021-12-15: 1000 mL via INTRAVENOUS

## 2021-12-15 MED ORDER — VANCOMYCIN HCL IN DEXTROSE 1-5 GM/200ML-% IV SOLN: 1000.0000 mg | Freq: Once | INTRAVENOUS | Status: DC

## 2021-12-15 MED ORDER — METHOCARBAMOL 500 MG PO TABS
1000.0000 mg | ORAL_TABLET | Freq: Three times a day (TID) | ORAL | Status: DC
  Administered 2021-12-15 – 2021-12-18 (×9): 1000 mg via ORAL
  Filled 2021-12-15 (×9): qty 2

## 2021-12-15 MED ORDER — APIXABAN 5 MG PO TABS
5.0000 mg | ORAL_TABLET | Freq: Two times a day (BID) | ORAL | Status: DC
Start: 2021-12-15 — End: 2021-12-18
  Administered 2021-12-15 – 2021-12-18 (×6): 5 mg via ORAL
  Filled 2021-12-15 (×6): qty 1

## 2021-12-15 NOTE — Assessment & Plan Note (Addendum)
Uncontrolled.  Last hemoglobin A1c 8.6 on 3/31.  On admission glucose elevated at 347.  Patient was supposed to be on Lantus 36 units twice daily but never started this medicine and has been taking Novolin 0- 20 units 4 times daily for elevated blood sugars. ?-Hypoglycemic protocol ?-CBGs before every meal with moderate SSI ?-Adjust insulin regimen as needed ?

## 2021-12-15 NOTE — ED Notes (Signed)
Pts visitor to the desk stating that the pt now wishes to stay. MD made aware.  ?

## 2021-12-15 NOTE — ED Notes (Addendum)
Pt upset about wound vac having to be placed with facility supplies, states "it is all about money". Attempted to explain to pt that the wound care nurse is unable to care for a home wound vac. Pt then stated "just stop talking you're giving me a headache". MD Ray made aware, order to not remove wound vac at this time.  ?

## 2021-12-15 NOTE — Assessment & Plan Note (Addendum)
Patient is s/p DCCV 07/2021.  Patient was supposed to be on amiodarone and Eliquis, but reported that he was told to discontinue Eliquis by the home health nurse. ?-Continue amiodarone ?-Resume Eliquis ?

## 2021-12-15 NOTE — Assessment & Plan Note (Signed)
Acute.  Initial lactic acid elevated 2.8, and repeat 2.1 after initial IV fluids and antibiotics.  Suspect underlying infection ?-Continue to monitor ?

## 2021-12-15 NOTE — Assessment & Plan Note (Addendum)
Acute.  WBC elevated 10.8. ?-Recheck CBC tomorrow morning ?

## 2021-12-15 NOTE — Assessment & Plan Note (Signed)
Blood pressures currently maintained.  Home blood pressure regimen includes amiodarone 200 mg daily, metoprolol succinate 25 mg daily, and furosemide 40 mg daily. ?-continue home regimen ?

## 2021-12-15 NOTE — ED Triage Notes (Signed)
BIB [facility] after pt's girlfriend called to report pt unresponsive on sofa. Per EMS, pt 02 in 50's with RR of 4. NRB placed, 02 in 90's. Pt became responsive 2 mins from hospital. MD Ray at bedside, RT at bedside, pt GCS 15 on arrival. PT 5 L [place].  ? ?HX: Stage 4 Ulcer with wound vac sacral, BIL BKA, foley cath  ?

## 2021-12-15 NOTE — Assessment & Plan Note (Signed)
Patient has a known sacral decubitus ulcer with prior history of osteomyelitis of the sacrum and coccyx no longer noted on imaging.  He has a wound VAC in place. ?-Wound care  ?-Low-air-loss mattress replaced ?

## 2021-12-15 NOTE — Assessment & Plan Note (Addendum)
Patient has chronic pain which he relates to his lower back.  Patient had been started on oxycodone ER 27 mg twice daily on 4/4 by his PCP and given a 1 week supply.  Patient had taken a dose this morning. ?-Pharmacy substitute OxyContin 20 mg twice daily ?

## 2021-12-15 NOTE — Assessment & Plan Note (Addendum)
Patient was initially noted to be hypoxic with O2 saturations in the 50s on room air and respirations of 4 for which patient was bagged temporarily. Chest x-ray noted elevated right hemidiaphragm with minimal right basilar segmental atelectasis.  CT skin of the abdomen and pelvis concerning for possible aspiration.  Patient had been started on empiric antibiotics of vancomycin, metronidazole, and cefepime. ?-Admit to a telemetry bed ?-Aspiration precautions with elevation of head of bed ?-Continuous pulse oximetry with nasal cannula oxygen maintain O2 saturation greater than 92% ?-Incentive spirometry and flutter valve ?-Follow-up blood cultures ?-Check procalcitonin ?-Continue empiric antibiotics of vancomycin and cefepime de-escalate when medically appropriate ?

## 2021-12-15 NOTE — ED Notes (Signed)
Consulted with MD Ray about wound vac, MD and this RN to bedside to discuss plan of care and possible admission. Pt upset about being at this hospital and does not wish to stay. MD attempted to explain reason for possible admission and risks for leaving. Pt continued to be upset and stated "I don't want to be here".  ?

## 2021-12-15 NOTE — ED Notes (Signed)
Lab called to add on CBC and CMP. ?

## 2021-12-15 NOTE — Assessment & Plan Note (Signed)
Urinalysis was positive for large leukocytes with rare bacteria, and greater than 50 WBCs.  Patient has had a Foley in place since 08/2021. ?-Check urine culture ?-Antibiotics as noted above ?

## 2021-12-15 NOTE — ED Notes (Signed)
Lab called to add on UA.  ?

## 2021-12-15 NOTE — Sepsis Progress Note (Signed)
Sepsis protocol is being followed by [facility]. 

## 2021-12-15 NOTE — Assessment & Plan Note (Addendum)
Patient had initially been found unresponsive on the sofa by his girlfriend for which EMS was called.  EMS reported O2 saturations in the 50s with respiratory rate of 4.  He was not given Narcan and symptoms improved after receiving oxygen via bag mask valve.  UDS was negative despite patient reporting taking oxycodone ER this morning.  Patient appears to be back to his baseline at this time.  Questioned a possibility of aspiration event versus unknown drug versus PE versus seizure as a cause of symptoms. ?-Neurochecks ?-Add on D-dimer ? ? ?

## 2021-12-15 NOTE — ED Notes (Signed)
Pt requesting that wound vac be changed. States that it is changed on Mondays. ?

## 2021-12-15 NOTE — Progress Notes (Signed)
Pharmacy Antibiotic Note ? ?[name] is a 56 y.o. male admitted on 12/15/2021 presenting as unresponsive with EMS, hx ulcer with wound vac, foley cath, concern for sepsis.  Pharmacy has been consulted for vancomycin and cefepime dosing. ? ?Plan: ?Vancomycin 1500 mg IV x 1, then 1250 mg IV q 12h (eAUC 476, Goal AUC 400-550, SCr 1.21) ?Cefepime 2g IV every 8 hours ?Monitor renal function, Cx and clinical progression to narrow ?Vancomycin levels as indicated ? ?  ? ?Temp (24hrs), Avg:97.6 ?F (36.4 ?C), Min:97.6 ?F (36.4 ?C), Max:97.6 ?F (36.4 ?C) ? ?Recent Labs  ?Lab 12/15/21 ?1103 12/15/21 ?1128  ?WBC 10.8*  --   ?CREATININE 1.21 1.10  ?LATICACIDVEN 2.8*  --   ?  ?CrCl cannot be calculated (Unknown ideal weight.).   ? ?Allergies  ?Allergen Reactions  ? Bactrim [Sulfamethoxazole-Trimethoprim]   ? Ceprotin [Protein C Concentrate (Human)]   ? Ciprofloxacin Other (See Comments)  ?  Kidney function  ? Levaquin [Levofloxacin]   ? ? ?Bertis Ruddy, PharmD ?Clinical Pharmacist ?ED Pharmacist Phone # [phone] ?12/15/2021 1:02 PM ? ? ?

## 2021-12-15 NOTE — Assessment & Plan Note (Addendum)
Patient noted to have a 3 cm solid and cystic lesion of the posterior aspect of the left kidney concerning for renal cell carcinoma. ?-Will need outpatient follow-up with urology ?

## 2021-12-15 NOTE — ED Notes (Signed)
PT transported to CT>

## 2021-12-15 NOTE — ED Notes (Signed)
MD Ray notified of lactic 2.8 ?

## 2021-12-15 NOTE — ED Provider Notes (Signed)
?[facility] ?Provider Note ? ? ?CSN: [id number] ?Arrival date & time: 12/15/21  1041 ? ?  ? ?History ? ?Chief Complaint  ?Patient presents with  ? Loss of Consciousness  ? ? ?[name] is a 56 y.o. male. ? ?HPI ?56 year old male history of type 2 diabetes, bilateral lower extremity amputations, history of sacral osteomyelitis, chronic sacral wound with wound VAC in place presents today with altered mental status.  By EMS report patient was altered today at home.  His significant other called 911.  On her call, clinically she was directed to start CPR.  Upon fire arrival patient had low respirations and was purplish in color.  He had good pulses.  They began administering oxygen via bag-valve-mask.  Upon EMS arrival his respirations were increasing to 8-10 and he was pinker than before.  Blood sugar was checked.  He had good pulses and blood pressure.  Blood sugar was over 300.  No medications were administered and he became alert and oriented in route.  He reports that he took 2 pain pills prior to the event starting.  He denies any illicit drug use or administering the medication to himself and away other than prescribed.  He denies any alcohol.  He denies fever. ?  ? ?Home Medications ?Prior to Admission medications   ?Medication Sig Start Date End Date Taking? Authorizing Provider  ?amiodarone (PACERONE) 200 MG tablet Take 1 tablet (200 mg total) by mouth daily. 12/05/21 02/03/22  [name], NP  ?blood glucose meter kit and supplies KIT Dispense based on patient and insurance preference. Use up to four times daily as directed. 12/05/21   [name], NP  ?finasteride (PROSCAR) 5 MG tablet Take 1 tablet (5 mg total) by mouth daily. 12/05/21 03/05/22  [name], NP  ?gabapentin (NEURONTIN) 400 MG capsule Take 1 capsule (400 mg total) by mouth 3 (three) times daily. 12/05/21 02/03/22  [name], NP  ?insulin glargine (LANTUS SOLOSTAR) 100 UNIT/ML  Solostar Pen Inject 36 Units into the skin 2 (two) times daily. ?Patient not taking: Reported on 12/05/2021 09/17/21   Modena Jansky, MD  ?Insulin Pen Needle 32G X 4 MM MISC Use as directed 09/17/21   Modena Jansky, MD  ?metoprolol succinate (TOPROL-XL) 25 MG 24 hr tablet Take 1 tablet (25 mg total) by mouth daily. 12/05/21 02/03/22  [name], NP  ?oxyCODONE ER (XTAMPZA ER) 27 MG C12A Take 1 tablet by mouth 2 (two) times daily as needed for up to 7 days. 12/09/21 12/16/21  [name], NP  ?protein supplement shake (PREMIER PROTEIN) LIQD Take 2 oz by mouth 3 (three) times daily with meals.    [provider]  ?SANTYL ointment SMARTSIG:3 Topical 3 Times Daily ?Patient not taking: Reported on 12/05/2021 09/19/21   [provider]  ?tamsulosin (FLOMAX) 0.4 MG CAPS capsule Take 1 capsule (0.4 mg total) by mouth daily. 12/05/21 03/05/22  [name], NP  ?testosterone cypionate (DEPOTESTOSTERONE CYPIONATE) 200 MG/ML injection Inject 1 mL (200 mg total) into the muscle every 14 (fourteen) days. 12/05/21   [name], NP  ?traZODone (DESYREL) 150 MG tablet Take 1 tablet (150 mg total) by mouth at bedtime as needed for sleep. 12/05/21   [name], NP  ?   ? ?Allergies    ?Bactrim [sulfamethoxazole-trimethoprim], Ceprotin [protein c concentrate (human)], Ciprofloxacin, and Levaquin [levofloxacin]   ? ?Review of Systems   ?Review of Systems  ?All  other systems reviewed and are negative. ? ?Physical Exam ?Updated Vital Signs ?SpO2 95%  ?Physical Exam ?Vitals and nursing note reviewed.  ?Constitutional:   ?   General: He is not in acute distress. ?   Appearance: Normal appearance.  ?HENT:  ?   Right Ear: External ear normal.  ?   Left Ear: External ear normal.  ?   Nose: Nose normal.  ?   Mouth/Throat:  ?   Pharynx: Oropharynx is clear.  ?Eyes:  ?   Pupils: Pupils are equal, round, and reactive to light.  ?Cardiovascular:  ?   Rate and Rhythm: Normal rate and regular rhythm.   ?   Pulses: Normal pulses.  ?Pulmonary:  ?   Effort: Pulmonary effort is normal.  ?   Breath sounds: Normal breath sounds.  ?Abdominal:  ?   General: Bowel sounds are normal.  ?   Palpations: Abdomen is soft.  ?Genitourinary: ?   Comments: Chronic indwelling Foley catheter ?Musculoskeletal:  ?   Cervical back: Normal range of motion.  ?   Comments: Radial pulses are 2+ ?Sacral wound with wound VAC in place ?Bilateral BKA's appear to be healing well  ?Skin: ?   General: Skin is warm.  ?   Capillary Refill: Capillary refill takes less than 2 seconds.  ?   Comments: Wound VAC in place over sacrum without surrounding erythema  ?Neurological:  ?   General: No focal deficit present.  ?   Mental Status: He is alert.  ?Psychiatric:     ?   Mood and Affect: Mood normal.  ? ? ?ED Results / Procedures / Treatments   ?Labs ?(all labs ordered are listed, but only abnormal results are displayed) ?Labs Reviewed - No data to display ? ?EKG ?None ? ?Radiology ?No results found. ? ?Procedures ?Procedures  ? ? ?Medications Ordered in ED ?Medications - No data to display ? ?ED Course/ Medical Decision Making/ A&P ?Clinical Course as of 12/15/21 1418  ?Mon Dec 15, 2021  ?1416 Chest x-ray reviewed interpreted with elevated right hemidiaphragm with minimal right basilar subsegmental atelectasis reviewed and interpreted [DR]  ?1416 CT abdomen pelvis.  Thickening of the urinary bladder decompressed by Foley ?Heterogenous mixed solid and cystic lesion of the posterior midportion of the left kidney ?3 dependent bibasilar atelectasis or consolidation of the lung bases [DR]  ?  ?Clinical Course User Index ?[DR] Pattricia Boss, MD  ? ?                        ?Medical Decision Making ?56 year old male significant past medical history of diabetes, sepsis, sacral osteomyelitis, presents today with episode of altered mental status.  Patient had hypoxia and was not reading well on his low unknown on EMS initial arrival required assistance. ?Without  intervention, patient had improvement of respiratory status and mental status before he arrived to the hospital. ?1 altered mental status with hypoxia unclear etiology patient did take extended narcotic medication just prior to this event.  However, given the onset of action and of change in mental status then resolution, it is unclear as the causative agent. ?2-elevated lactic acid and hypothermia noted.  Patient with multiple positive etiologies of infection including lung base, bladder, and known osteomyelitis of sacrum ?IV fluids and broad-spectrum antibiotics initiated ?Awaiting results of lumbosacral and bony CT results- IT interface issues, discussed with Dr.Shogry, he does not see definite acute changes but  will review when it issue resolved ?Discussed above  with Dr. Tamala Julian and he is aware of above. ?3 hyperglycemia with known diabetes no evidence of DKA ?4 heterogenous mixed cystic lesion of the posterior midportion of the left kidney with thick contrast enhancing septations-patient states he is aware of this unclear etiology, discussed with Dr. Tamala Julian ? ?Amount and/or Complexity of Data Reviewed ?Independent Historian: EMS ?External Data Reviewed: notes. ?Labs: ordered. Decision-making details documented in ED Course. ?Radiology: ordered and independent interpretation performed. Decision-making details documented in ED Course. ?ECG/medicine tests: ordered and independent interpretation performed. ?Discussion of management or test interpretation with external provider(s): Discussed care with Dr. Maree Erie on for radiology ?Discussed care with Dr. Tamala Julian on for hospitalist ? ?Risk ?Prescription drug management. ?Decision regarding hospitalization. ? ?Critical Care ?Total time providing critical care: 45 minutes ? ? ? ? ? ? ? ? ? ?Final Clinical Impression(s) / ED Diagnoses ?Final diagnoses:  ?None  ? ? ?Rx / DC Orders ?ED Discharge Orders   ? ? None  ? ?  ? ? ?  ?Pattricia Boss, MD ?12/15/21 1744 ? ?

## 2021-12-15 NOTE — H&P (Signed)
?History and Physical  ? ? ?Patient: [name] PYP:[id number] DOB: March 19, 1966 ?DOA: 12/15/2021 ?DOS: the patient was seen and examined on 12/15/2021 ?PCP: Teena Dunk, NP  ?Patient coming from: Home via EMS ? ?Chief Complaint:  ?Chief Complaint  ?Patient presents with  ? Loss of Consciousness  ? ?HPI: [name] is a 56 y.o. male with medical history significant of hypertension, chronic systolic CHF, DM type II, and bilateral BKA who presents after being found unresponsive by his friend this morning.  History is obtained from the patient as well as his friend who is present at bedside.  His friend noticed that she had stepped away for 10 to 15 minutes and when she came back found him slumped over on the couch with shallow respirations.  She reports to her knowledge he had a pulse.  He had taken the newly prescribed oxycodone ER 27 mg this morning prior to the event, but only reported taking 1 pill and not two as previously reported.  Patient notes that this medication does not help with his lower back pain and his primary is in the process of referring him to pain management.   ? ?He recently had a prolonged hospitalization 07/21/2021-09/09/2021 with left foot infection status post BKA 07/23/21, hospital course complicated by Group B strep bacteremia with discitis/osteomyelitis with epidural abscess, new diagnosis of systolic CHF, paroxysmal a flutter, sacral decubitus ulcers.  He went to [facility] and rehab and he reports that had a terrible experience with the nursing staff, his antibiotics were missed as well as pain medications which he was hospitalized from 09/12/2021-09/19/2021 with continuation of the therapies.  Seen by ID and general surgery.  He was recommended to go to rehab, but refused and was discharged home approximately 3 weeks ago.  Patient notes that he has a known renal lesion for which he is supposed to follow-up with Dr. Louis Meckel of [facility] but has not done so yet.  He has  had the Foley catheter in place since 08/2021, but was last changed out 2 weeks ago.  He reports that he rarely feels the urge to urinate.  Patient also reports that after getting home he was told by home health nurse not to take Eliquis.  Since getting home he has had wound care coming by to help out with his wound VAC and as needed being changed today.  He reports that the wound has been closing up.  Prior to today he had not been feeling well, with reports of intermittent chills, and change in taste to his chewing tobacco. ? ?In route with EMS patient was noted to have O2 saturations in the 50s with respirations of 4 for which he was given respirations via bag mask and placed on nonrebreather with O2 saturations in the 90s.  Patient was never given Narcan and became responsive 2 minutes away from the hospital.  Labs noted WBC 10.8, BUN 20, creatinine 1.21, glucose 347, high-sensitivity troponin 14->19, and lactic acid 2.8->2.1.  Chest x-ray noted elevated right hemidiaphragm with minimal right basilar segmental atelectasis. CT scan of the abdomen pelvis noted severe thickening of the urinary bladder decompressed by Foley suggesting infectious or inflammatory cystitis, mixed solid and cystic lesion of the posterior portion of the left kidney measuring up to 3 cm concerning for renal cell carcinoma, and bibasilar atelectasis or consolidation of the lung bases right greater than left concerning for aspiration.  Urinedrug screen was negative.  Urinalysis noted large leukocytes, small hemoglobin, rare bacteria, and  greater than 50 WBCs.  Blood cultures were obtained.  Patient was bolused 1 L of lactated Ringer's, started on empiric antibiotics of vancomycin, metronidazole, and cefepime. ? ? ?Review of Systems: As mentioned in the history of present illness. All other systems reviewed and are negative. ?Past Medical History:  ?Diagnosis Date  ? Chronic diastolic CHF (congestive heart failure) ([place])   ? Complication of  anesthesia   ? Depression   ? Diabetes mellitus without complication ([place])   ? Hypertension   ? ?Past Surgical History:  ?Procedure Laterality Date  ? AMPUTATION Left 07/23/2021  ? Procedure: LEFT BELOW KNEE AMPUTATION;  Surgeon: [name], MD;  Location: [facility];  Service: Orthopedics;  Laterality: Left;  ? BELOW KNEE LEG AMPUTATION Right   ? BUBBLE STUDY  07/29/2021  ? Procedure: BUBBLE STUDY;  Surgeon: Sueanne Margarita, MD;  Location: [facility];  Service: Cardiovascular;;  ? CARDIOVERSION N/A 07/29/2021  ? Procedure: CARDIOVERSION;  Surgeon: Sueanne Margarita, MD;  Location: [place];  Service: Cardiovascular;  Laterality: N/A;  ? RADIOLOGY WITH ANESTHESIA N/A 08/05/2021  ? Procedure: MRI LUMBAR WITH AND WITHOUT; THORASIC SPINE WITH AND WITHOUT WITH ANESTHESIA;  Surgeon: Radiologist, Medication, MD;  Location: [facility];  Service: Radiology;  Laterality: N/A;  ? RADIOLOGY WITH ANESTHESIA N/A 08/07/2021  ? Procedure: MRI WITH LUMBER WITH AND WITHOUT CONTRAST,THORACIC WITH AND WITHOUT CONTRAST;  Surgeon: Radiologist, Medication, MD;  Location: [facility];  Service: Radiology;  Laterality: N/A;  ? RADIOLOGY WITH ANESTHESIA N/A 09/13/2021  ? Procedure: MRI WITH ANESTHESIA;  Surgeon: Luanne Bras, MD;  Location: [facility];  Service: Radiology;  Laterality: N/A;  ? TEE WITHOUT CARDIOVERSION N/A 07/29/2021  ? Procedure: TRANSESOPHAGEAL ECHOCARDIOGRAM (TEE);  Surgeon: Sueanne Margarita, MD;  Location: [facility] [place];  Service: Cardiovascular;  Laterality: N/A;  ? ?Social History:  reports that he has never smoked. He has never used smokeless tobacco. He reports that he does not drink alcohol and does not use drugs. ? ?Allergies  ?Allergen Reactions  ? Bactrim [Sulfamethoxazole-Trimethoprim]   ? Ceprotin [Protein C Concentrate (Human)]   ? Ciprofloxacin Other (See Comments)  ?  Kidney function  ? Levaquin [Levofloxacin]   ? ? ?Family History  ?Problem Relation Age of Onset  ? Anxiety disorder Mother   ? Cancer Father   ?  Coronary artery disease Father   ? ? ?Prior to Admission medications   ?Medication Sig Start Date End Date Taking? Authorizing Provider  ?amiodarone (PACERONE) 200 MG tablet Take 1 tablet (200 mg total) by mouth daily. 12/05/21 02/03/22  [name], NP  ?blood glucose meter kit and supplies KIT Dispense based on patient and insurance preference. Use up to four times daily as directed. 12/05/21   [name], NP  ?finasteride (PROSCAR) 5 MG tablet Take 1 tablet (5 mg total) by mouth daily. 12/05/21 03/05/22  [name], NP  ?gabapentin (NEURONTIN) 400 MG capsule Take 1 capsule (400 mg total) by mouth 3 (three) times daily. 12/05/21 02/03/22  [name], NP  ?insulin glargine (LANTUS SOLOSTAR) 100 UNIT/ML Solostar Pen Inject 36 Units into the skin 2 (two) times daily. ?Patient not taking: Reported on 12/05/2021 09/17/21   Modena Jansky, MD  ?Insulin Pen Needle 32G X 4 MM MISC Use as directed 09/17/21   Modena Jansky, MD  ?metoprolol succinate (TOPROL-XL) 25 MG 24 hr tablet Take 1 tablet (25 mg total) by mouth daily. 12/05/21 02/03/22  [name], NP  ?oxyCODONE ER (  XTAMPZA ER) 27 MG C12A Take 1 tablet by mouth 2 (two) times daily as needed for up to 7 days. 12/09/21 12/16/21  [name], NP  ?protein supplement shake (PREMIER PROTEIN) LIQD Take 2 oz by mouth 3 (three) times daily with meals.    [provider]  ?SANTYL ointment SMARTSIG:3 Topical 3 Times Daily ?Patient not taking: Reported on 12/05/2021 09/19/21   [provider]  ?tamsulosin (FLOMAX) 0.4 MG CAPS capsule Take 1 capsule (0.4 mg total) by mouth daily. 12/05/21 03/05/22  [name], NP  ?testosterone cypionate (DEPOTESTOSTERONE CYPIONATE) 200 MG/ML injection Inject 1 mL (200 mg total) into the muscle every 14 (fourteen) days. 12/05/21   [name], NP  ?traZODone (DESYREL) 150 MG tablet Take 1 tablet (150 mg total) by mouth at bedtime as needed for sleep. 12/05/21   [name],  NP  ? ? ?Physical Exam: ?Vitals:  ? 12/15/21 1325 12/15/21 1327 12/15/21 1400 12/15/21 1430  ?BP: 115/70 115/70 129/85 122/71  ?Pulse: 67 60 61 (!) 59  ?Resp: '19 11 11 11  ' ?Temp:      ?TempSrc:      ?SpO2: 98

## 2021-12-16 DIAGNOSIS — L89154 Pressure ulcer of sacral region, stage 4: Secondary | ICD-10-CM | POA: Diagnosis not present

## 2021-12-16 DIAGNOSIS — R4182 Altered mental status, unspecified: Secondary | ICD-10-CM

## 2021-12-16 DIAGNOSIS — J9601 Acute respiratory failure with hypoxia: Secondary | ICD-10-CM | POA: Diagnosis not present

## 2021-12-16 DIAGNOSIS — J690 Pneumonitis due to inhalation of food and vomit: Secondary | ICD-10-CM | POA: Diagnosis not present

## 2021-12-16 LAB — CBC
HCT: 34.3 % — ABNORMAL LOW (ref 39.0–52.0)
Hemoglobin: 11.6 g/dL — ABNORMAL LOW (ref 13.0–17.0)
MCH: 29.5 pg (ref 26.0–34.0)
MCHC: 33.8 g/dL (ref 30.0–36.0)
MCV: 87.3 fL (ref 80.0–100.0)
Platelets: 199 10*3/uL (ref 150–400)
RBC: 3.93 MIL/uL — ABNORMAL LOW (ref 4.22–5.81)
RDW: 14.6 % (ref 11.5–15.5)
WBC: 7.5 10*3/uL (ref 4.0–10.5)
nRBC: 0 % (ref 0.0–0.2)

## 2021-12-16 LAB — COMPREHENSIVE METABOLIC PANEL
ALT: 30 U/L (ref 0–44)
AST: 20 U/L (ref 15–41)
Albumin: 2.4 g/dL — ABNORMAL LOW (ref 3.5–5.0)
Alkaline Phosphatase: 70 U/L (ref 38–126)
Anion gap: 5 (ref 5–15)
BUN: 14 mg/dL (ref 6–20)
CO2: 28 mmol/L (ref 22–32)
Calcium: 8.6 mg/dL — ABNORMAL LOW (ref 8.9–10.3)
Chloride: 104 mmol/L (ref 98–111)
Creatinine, Ser: 0.98 mg/dL (ref 0.61–1.24)
GFR, Estimated: >60 mL/min (ref >60)
Glucose, Bld: 224 mg/dL — ABNORMAL HIGH (ref 70–99)
Potassium: 4 mmol/L (ref 3.5–5.1)
Sodium: 137 mmol/L (ref 135–145)
Total Bilirubin: 0.4 mg/dL (ref 0.3–1.2)
Total Protein: 5.5 g/dL — ABNORMAL LOW (ref 6.5–8.1)

## 2021-12-16 LAB — LACTIC ACID, PLASMA: Lactic Acid, Venous: 1.8 mmol/L (ref 0.5–1.9)

## 2021-12-16 LAB — GLUCOSE, CAPILLARY
Glucose-Capillary: 212 mg/dL — ABNORMAL HIGH (ref 70–99)
Glucose-Capillary: 275 mg/dL — ABNORMAL HIGH (ref 70–99)
Glucose-Capillary: 226 mg/dL — ABNORMAL HIGH (ref 70–99)
Glucose-Capillary: 254 mg/dL — ABNORMAL HIGH (ref 70–99)

## 2021-12-16 LAB — MAGNESIUM: Magnesium: 1.4 mg/dL — ABNORMAL LOW (ref 1.7–2.4)

## 2021-12-16 MED ORDER — ZINC SULFATE 220 (50 ZN) MG PO CAPS
220.0000 mg | ORAL_CAPSULE | Freq: Every day | ORAL | Status: DC
  Administered 2021-12-16 – 2021-12-18 (×3): 220 mg via ORAL
  Filled 2021-12-16 (×3): qty 1

## 2021-12-16 MED ORDER — MAGNESIUM SULFATE 2 GM/50ML IV SOLN
2.0000 g | Freq: Once | INTRAVENOUS | Status: AC
  Administered 2021-12-16: 2 g via INTRAVENOUS
  Filled 2021-12-16: qty 50

## 2021-12-16 MED ORDER — ASCORBIC ACID 500 MG PO TABS
500.0000 mg | ORAL_TABLET | Freq: Two times a day (BID) | ORAL | Status: DC
  Administered 2021-12-16 – 2021-12-18 (×5): 500 mg via ORAL
  Filled 2021-12-16 (×5): qty 1

## 2021-12-16 MED ORDER — HYDROMORPHONE HCL 1 MG/ML IJ SOLN
0.5000 mg | INTRAMUSCULAR | Status: DC | PRN
  Administered 2021-12-16 – 2021-12-18 (×10): 0.5 mg via INTRAVENOUS
  Filled 2021-12-16 (×12): qty 0.5

## 2021-12-16 MED ORDER — HYDROMORPHONE HCL 1 MG/ML IJ SOLN
0.5000 mg | Freq: Once | INTRAMUSCULAR | Status: AC
  Administered 2021-12-16: 0.5 mg via INTRAVENOUS
  Filled 2021-12-16: qty 0.5

## 2021-12-16 MED ORDER — ADULT MULTIVITAMIN W/MINERALS CH
1.0000 | ORAL_TABLET | Freq: Every day | ORAL | Status: DC
  Administered 2021-12-16 – 2021-12-18 (×3): 1 via ORAL
  Filled 2021-12-16 (×3): qty 1

## 2021-12-16 MED ORDER — INSULIN ASPART 100 UNIT/ML IJ SOLN
4.0000 [IU] | Freq: Three times a day (TID) | INTRAMUSCULAR | Status: DC
Start: 2021-12-16 — End: 2021-12-18
  Administered 2021-12-16 – 2021-12-18 (×7): 4 [IU] via SUBCUTANEOUS

## 2021-12-16 MED ORDER — ENSURE MAX PROTEIN PO LIQD
11.0000 [oz_av] | Freq: Three times a day (TID) | ORAL | Status: DC
  Administered 2021-12-16 – 2021-12-17 (×4): 11 [oz_av] via ORAL

## 2021-12-16 NOTE — Progress Notes (Addendum)
Inpatient Diabetes Program Recommendations ? ?AACE/ADA: New Consensus Statement on Inpatient Glycemic Control (2015) ? ?Target Ranges:  Prepandial:   less than 140 mg/dL ?     Peak postprandial:   less than 180 mg/dL (1-2 hours) ?     Critically ill patients:  140 - 180 mg/dL  ? ? ? ?Review of Glycemic Control ? Latest Reference Range & Units 12/15/21 17:10 12/15/21 21:45 12/16/21 08:05  ?Glucose-Capillary 70 - 99 mg/dL 230 (H) 275 (H) 212 (H)  ? ? Latest Reference Range & Units 07/22/21 00:04 12/05/21 11:25  ?Hemoglobin A1C 4.0 - 5.6 % ?4.0 - 5.6 % 13.8 (H) 8.6 ! ?8.6  ?(H): Data is abnormally high ?!: Data is abnormal ?Diabetes history: DM 2 ?Outpatient Diabetes medications:  ?Novolin R- PRN 6 times a day ?Current orders for Inpatient glycemic control:  ?Semglee 20 units bid ?Novolog moderate tid with meals ? ?Inpatient Diabetes Program Recommendations:   ?  A1C is improved from November of last year. Per patient he was only taking Regular insulin 6 times a day at home. May consider adding NPH 15 units bid to home insulin regimen. Patient prefers to buy insulin from [facility] stating it is more affordable.  He can buy the Reli-on NPH and Reli-on Regular from [facility] for 25$ a vial.  I explained to patient that the intermediate insulin twice a day will reduce the need for as much Regular insulin throughout the day?  ? ? While in the hospital consider adding Novolog meal coverage 4 units tid with meals.  Will follow.  ? ?Thanks,  ?Adah Perl, RN, BC-ADM ?Inpatient Diabetes Coordinator ?Pager [phone]  (8a-5p) ? ?Addendum 1330- Spoke with patient regarding home DM management.  He states that he was not taking long-acting insulin at home due to cost.  He instead has been using Novolin-R up to 6 times a day?  He notes that while he was in SNF, they did not do a good job managing his blood sugars.  He states that he could never tell that the long-acting insulin did very much good?   ? ? ?

## 2021-12-16 NOTE — Progress Notes (Signed)
? ? ? [facility] ?                                                                            ? ? ?[name], is a 56 y.o. male, DOB - [birth year], HLK:[id number] ?Admit date - 12/15/2021    ?Outpatient Primary MD for the patient is [name] Merino I, NP ? ?LOS - 1  days ? ? ? ?Brief summary  ? ? ?56 year old gentleman with prior h/o chronic systolic CHF.  ? ?Assessment & Plan  ? ? ?Assessment and Plan: ?* Decubitus ulcer of sacral area ?Patient has a known sacral decubitus ulcer with prior history of osteomyelitis of the sacrum and coccyx no longer noted on imaging.  ? He has a wound VAC in place. ?-Wound care  ?-Low-air-loss mattress replaced. ?Imaging is negative for infection.  ? ?Acute respiratory failure with hypoxia (HCC) secondary to suspected aspiration pneumonia ?Patient was initially noted to be hypoxic with O2 saturations in the 50s on room air and respirations of 4 for which patient was bagged temporarily. Chest x-ray noted elevated right hemidiaphragm with minimal right basilar segmental atelectasis.  CT skin of the abdomen and pelvis concerning for possible aspiration.  Patient had been started on empiric antibiotics of vancomycin, metronidazole, and cefepime. ?-Continuous pulse oximetry with nasal cannula oxygen maintain O2 saturation greater than 92% ?-Incentive spirometry and flutter valve ?-Follow-up blood cultures, negative so far.  ?-Continue empiric antibiotics of vancomycin and cefepime de-escalate when medically appropriate ? ?Episode of unresponsiveness ?Patient had initially been found unresponsive on the sofa by his girlfriend for which EMS was called.  EMS reported O2 saturations in the 50s with respiratory rate of 4.  He was not given Narcan and symptoms improved after receiving oxygen via bag mask valve. ?  UDS was negative .Questioned a possibility of aspiration event versus unknown drug versus PE versus seizure as a cause of symptoms. ?Patient currently at baseline.   ? ?Complicated UTI (urinary tract infection) ?Urinalysis was positive for large leukocytes with rare bacteria, and greater than 50 WBCs.  Patient has had a Foley in place since 08/2021. ?-Check urine culture ?-Antibiotics as noted above ? ?Leukocytosis ?Acute.  WBC elevated 10.8. ?Appears to have resolved.  ? ?Lactic acidosis ?Acute.  Initial lactic acid elevated 2.8, and repeat 2.1 after initial IV fluids and antibiotics.  Suspect underlying infection ?Recheck lactic acid level in am.  ? ?Renal mass ?Patient noted to have a 3 cm solid and cystic lesion of the posterior aspect of the left kidney concerning for renal cell carcinoma. ?-Will need outpatient follow-up with urology ? ?Chronic pain ?Patient has chronic pain which he relates to his lower back.  Patient had been started on oxycodone ER 27 mg twice daily on 4/4 by his PCP and given a 1 week supply.  Patient had taken a dose this morning. ?-Pharmacy substitute OxyContin 20 mg twice daily ? ?Paroxysmal atrial flutter ([place]) ?Patient is s/p DCCV 07/2021.  Patient was supposed to be on amiodarone and Eliquis, but reported that he was told to discontinue Eliquis by the home health nurse. ?Continue with amiodarone and restarted Eliquis. His CHAD2 Score is 3. Will get cardiology input regarding  continuation of Eliquis.  ? ?Type 2 diabetes mellitus with hyperglycemia, with long-term current use of insulin ([place]) ?Uncontrolled.   ?Last hemoglobin A1c 8.6 on 3/31.  On admission glucose elevated at 347.  Patient was supposed to be on Lantus 36 units twice daily but never started this medicine and has been taking Novolin 0- 20 units 4 times daily for elevated blood sugars. ?CBG (last 3)  ?Recent Labs  ?  12/16/21 ?0805 12/16/21 ?1139 12/16/21 ?1552  ?GLUCAP 212* 275* 226*  ?Added novolog 4 units TIDAC.  ? ? ? ?Essential hypertension ?Blood pressure parameters are optimal.  ?Home blood pressure regimen includes amiodarone 200 mg daily, metoprolol succinate 25 mg daily,  and furosemide 40 mg daily. ?-continue home regimen ? ? ? ? ?  ? ? ?RN Pressure Injury Documentation: ?Pressure Injury 07/27/21 Sacrum Mid Unstageable - Full thickness tissue loss in which the base of the injury is covered by slough (yellow, tan, gray, green or brown) and/or eschar (tan, brown or black) in the wound bed. small pink pressure ulcer with bre (Active)  ?07/27/21 0255  ?Location: Sacrum  ?Location Orientation: Mid  ?Staging: Unstageable - Full thickness tissue loss in which the base of the injury is covered by slough (yellow, tan, gray, green or brown) and/or eschar (tan, brown or black) in the wound bed.  ?Wound Description (Comments): small pink pressure ulcer with break in skin 08/12/21; updated to unstageable pressure injury  ?Present on Admission: Yes  ?   ?Pressure Injury 09/30/21 Sacrum Lower;Mid Stage 3 -  Full thickness tissue loss. Subcutaneous fat may be visible but bone, tendon or muscle are NOT exposed. stage 3 w/tunneling, has pink bedding in middle of wound and green eschar around the inner sides  (Active)  ?09/30/21 1712  ?Location: Sacrum  ?Location Orientation: Lower;Mid  ?Staging: Stage 3 -  Full thickness tissue loss. Subcutaneous fat may be visible but bone, tendon or muscle are NOT exposed.  ?Wound Description (Comments): stage 3 w/tunneling, has pink bedding in middle of wound and green eschar around the inner sides surrounding th ebedding of wound.  ?Present on Admission: Yes  ?Dressing Type Negative pressure wound therapy 12/15/21 2153  ? ?WOUND care on board and appreciate recommendations.  ?  ? ?Estimated body mass index is 36.54 kg/m? as calculated from the following: ?  Height as of this encounter: '5\' 8"'$  (1.727 m). ?  Weight as of 09/14/21: 109 kg. ? ?Code Status: PARTIAL CODE.  ?DVT Prophylaxis:   ?apixaban (ELIQUIS) tablet 5 mg  ? ?Level of Care: Level of care: Telemetry Medical ?Family Communication: None at bedside.  ? ?Disposition Plan:     Remains inpatient appropriate:   IV antibiotics.  ? ?Procedures:  ?None.  ?Consultants:   ?Wound care RN ? ?Antimicrobials:  ? ?Anti-infectives (From admission, onward)  ? ? Start     Dose/Rate Route Frequency Ordered Stop  ? 12/16/21 0600  vancomycin (VANCOREADY) IVPB 1250 mg/250 mL       ? 1,250 mg ?166.7 mL/hr over 90 Minutes Intravenous Every 12 hours 12/15/21 1324    ? 12/15/21 2200  ceFEPIme (MAXIPIME) 2 g in sodium chloride 0.9 % 100 mL IVPB       ? 2 g ?200 mL/hr over 30 Minutes Intravenous Every 8 hours 12/15/21 1324    ? 12/15/21 1245  ceFEPIme (MAXIPIME) 2 g in sodium chloride 0.9 % 100 mL IVPB       ? 2 g ?200 mL/hr over 30 Minutes Intravenous  Once 12/15/21 1232 12/15/21 1340  ? 12/15/21 1245  metroNIDAZOLE (FLAGYL) IVPB 500 mg       ? 500 mg ?100 mL/hr over 60 Minutes Intravenous  Once 12/15/21 1232 12/15/21 1440  ? 12/15/21 1245  vancomycin (VANCOCIN) IVPB 1000 mg/200 mL premix  Status:  Discontinued       ? 1,000 mg ?200 mL/hr over 60 Minutes Intravenous  Once 12/15/21 1232 12/15/21 1236  ? 12/15/21 1245  vancomycin (VANCOREADY) IVPB 1500 mg/300 mL       ? 1,500 mg ?150 mL/hr over 120 Minutes Intravenous  Once 12/15/21 1236 12/15/21 1700  ? ?  ? ? ? ?Medications ? ?Scheduled Meds: ? amiodarone  200 mg Oral Daily  ? apixaban  5 mg Oral BID  ? finasteride  5 mg Oral Daily  ? furosemide  40 mg Oral Daily  ? gabapentin  400 mg Oral TID  ? insulin aspart  0-15 Units Subcutaneous TID WC  ? insulin glargine-yfgn  20 Units Subcutaneous BID  ? methocarbamol  1,000 mg Oral TID  ? metoprolol succinate  25 mg Oral Daily  ? oxyCODONE  20 mg Oral Q12H  ? sodium chloride flush  3 mL Intravenous Q12H  ? tamsulosin  0.4 mg Oral Daily  ? traZODone  150 mg Oral QHS  ? ?Continuous Infusions: ? ceFEPime (MAXIPIME) IV 2 g (12/16/21 0706)  ? magnesium sulfate bolus IVPB    ? vancomycin 1,250 mg (12/16/21 0710)  ? ?PRN Meds:.acetaminophen **OR** acetaminophen, albuterol, HYDROmorphone (DILAUDID) injection ? ? ? ?Subjective:  ? ?Bastion Bolger was seen and  examined today.  Pt reports severe pain in the back. Requesting IV pain medications.  ? ?Objective:  ? ?Vitals:  ? 12/15/21 2141 12/16/21 0401 12/16/21 0447 12/16/21 0801  ?BP: (!) 147/87 (!) 144/88  129/77

## 2021-12-16 NOTE — Consult Note (Signed)
WOC Nurse Consult Note: ?Reason for Consult: change of NPWT dressing. Patient from home with Gentax NPWT device. Reports it can not be bridged because the amount of suction. Today when I assess the wound/dressing it is leaking and has a very foul odor. Canister appears to be full ?Wound type: ?Stage 4 Pressure injury ?Pressure Injury POA: Yes ?Measurement: 3cm x 3cm x 2cm with tunneling at 12-1 o'clock that is 3cm  ?Wound KAJ:GOTLX, pink ?Drainage (amount, consistency, odor) brown in canister  ?Periwound: SEVERE epibole  ?Dressing procedure/placement/frequency: ?Removed old NPWT dressing, removed canister from home machine and discarded.  Home unit and charger placed together in the patient's room with a note to leave. Patient to have CG take back to home. I have explained that at the time of DC patient would have current NPWT dressing DCed and a saline dressing applied for him to DC to home and [facility] can replace home NPWT dressing once back at home  ?Cleansed periwound skin well with water ?Apply drape to the right hip for protection from foam bridge ?1 pc of black foam used to fill tunneled area, 1 pc of black foam used to fill remainder of the wound bed, 1 pc of black foam used for bridge to the right hip.  ?Seal obtained at 143mHG. Patient tolerated well.  ? ?Updated orders.  ? ?H[facility]el Devan [facility], CWON-AP ?[phone]  ? ?  ?

## 2021-12-16 NOTE — Plan of Care (Signed)

## 2021-12-16 NOTE — Progress Notes (Signed)
Initial Nutrition Assessment ? ?DOCUMENTATION CODES:  ? ?Obesity unspecified ? ?INTERVENTION:  ? ?-Ensure Max po TID, each supplement provides 150 kcal and 30 grams of protein. ?-MVI with minerals daily ?-500 mg vitamin C BID ?-220 mg zinc sulfate daily x 14 days ? ?NUTRITION DIAGNOSIS:  ? ?Increased nutrient needs related to wound healing as evidenced by estimated needs. ? ?GOAL:  ? ?Patient will meet greater than or equal to 90% of their needs ? ?MONITOR:  ? ?PO intake, Supplement acceptance, Labs, Weight trends, Skin ? ?REASON FOR ASSESSMENT:  ? ?Consult ?Assessment of nutrition requirement/status ? ?ASSESSMENT:  ? ?[name] is a 56 y.o. male with medical history significant of hypertension, chronic systolic CHF, DM type II, and bilateral BKA who presents after being found unresponsive by his friend this morning.  History is obtained from the patient as well as his friend who is present at bedside.  His friend noticed that she had stepped away for 10 to 15 minutes and when she came back found him slumped over on the couch with shallow respirations.  She reports to her knowledge he had a pulse.  He had taken the newly prescribed oxycodone ER 27 mg this morning prior to the event, but only reported taking 1 pill and not two as previously reported.  Patient notes that this medication does not help with his lower back pain and his primary is in the process of referring him to pain management. ? ?Pt admitted with sacral wound with previous osteomyelitis and respiratory failure secondary to aspiration pneumonia.  ? ?Reviewed I/O's: +1 L x 24 hours ? ?UOP: 2.6 L x 24 hours ? ?Spoke with pt over the phone. He reports good appetite both presently at home. He shares he does not have a consistent meal schedule, grazes throughout the day ("I eat all day"). Per pt, he also consumes 3 Premier Protein shakes daily. He was about to start a MVI, but has not started it as it was just delivered to his home yesterday.  ? ?Pt  denies any weight loss. Reviewed wt hx; pt wt has been stable over the past 9 months.  ? ?Discussed importance of good meal and supplement intake to promote healing. Pt amenable to Ensure Max, which is the formulary equivalent to [place]. He is also amenable to MVI, zinc, and zinc to help support wound healing.  ? ?Medications reviewed and include lasix.  ? ?Lab Results  ?Component Value Date  ? HGBA1C 8.6 (A) 12/05/2021  ? HGBA1C 8.6 12/05/2021  ? HGBA1C 8.6 (A) 12/05/2021  ? HGBA1C 8.6 (A) 12/05/2021  ? PTA DM medications are 36 units insulin glargine BID.  ? ?Labs reviewed: Mg: 1.4, CBGS: 212-230 (inpatient orders for glycemic control are 0-15 units inuslin aspart TID with meals and 20 units insulin glargine-yfgn daily).   ? ?Diet Order:   ?Diet Order   ? ?       ?  Diet heart healthy/carb modified Room service appropriate? Yes; Fluid consistency: Thin  Diet effective now       ?  ? ?  ?  ? ?  ? ? ?EDUCATION NEEDS:  ? ?Education needs have been addressed ? ?Skin:  Skin Assessment: Skin Integrity Issues: ?Skin Integrity Issues:: Stage III ?Stage III: sacrum ? ?Last BM:  12/15/21 ? ?Height:  ? ?Ht Readings from Last 1 Encounters:  ?12/16/21 '5\' 8"'$  (1.727 m)  ? ? ?Weight:  ? ?Wt Readings from Last 1 Encounters:  ?09/14/21 109 kg  ? ? ?  Ideal Body Weight:  60.9 kg (adjusted for bilateral BKAs) ? ?BMI:  Body mass index is 36.54 kg/m?. ? ?Estimated Nutritional Needs:  ? ?Kcal:  2100-2300 ? ?Protein:  125-150 grams ? ?Fluid:  > 2 L ? ? ? ?Loistine Chance, RD, LDN, CDCES ?Registered Dietitian II ?Certified Diabetes Care and Education Specialist ?Please refer to [facility] for RD and/or RD on-call/weekend/after hours pager  ?

## 2021-12-17 ENCOUNTER — Other Ambulatory Visit: Payer: Self-pay | Admitting: Nurse Practitioner

## 2021-12-17 DIAGNOSIS — J9601 Acute respiratory failure with hypoxia: Secondary | ICD-10-CM | POA: Diagnosis not present

## 2021-12-17 DIAGNOSIS — L89154 Pressure ulcer of sacral region, stage 4: Secondary | ICD-10-CM | POA: Diagnosis not present

## 2021-12-17 DIAGNOSIS — F5101 Primary insomnia: Secondary | ICD-10-CM

## 2021-12-17 DIAGNOSIS — J690 Pneumonitis due to inhalation of food and vomit: Secondary | ICD-10-CM | POA: Diagnosis not present

## 2021-12-17 DIAGNOSIS — N390 Urinary tract infection, site not specified: Secondary | ICD-10-CM | POA: Diagnosis not present

## 2021-12-17 LAB — CBC
HCT: 39.3 % (ref 39.0–52.0)
Hemoglobin: 13.1 g/dL (ref 13.0–17.0)
MCH: 29.6 pg (ref 26.0–34.0)
MCHC: 33.3 g/dL (ref 30.0–36.0)
MCV: 88.7 fL (ref 80.0–100.0)
Platelets: 209 10*3/uL (ref 150–400)
RBC: 4.43 MIL/uL (ref 4.22–5.81)
RDW: 14.5 % (ref 11.5–15.5)
WBC: 5.8 10*3/uL (ref 4.0–10.5)
nRBC: 0 % (ref 0.0–0.2)

## 2021-12-17 LAB — GLUCOSE, CAPILLARY
Glucose-Capillary: 188 mg/dL — ABNORMAL HIGH (ref 70–99)
Glucose-Capillary: 215 mg/dL — ABNORMAL HIGH (ref 70–99)
Glucose-Capillary: 249 mg/dL — ABNORMAL HIGH (ref 70–99)
Glucose-Capillary: 193 mg/dL — ABNORMAL HIGH (ref 70–99)

## 2021-12-17 LAB — BASIC METABOLIC PANEL
Anion gap: 5 (ref 5–15)
BUN: 11 mg/dL (ref 6–20)
CO2: 32 mmol/L (ref 22–32)
Calcium: 8.8 mg/dL — ABNORMAL LOW (ref 8.9–10.3)
Chloride: 100 mmol/L (ref 98–111)
Creatinine, Ser: 0.96 mg/dL (ref 0.61–1.24)
GFR, Estimated: >60 mL/min (ref >60)
Glucose, Bld: 177 mg/dL — ABNORMAL HIGH (ref 70–99)
Potassium: 3.8 mmol/L (ref 3.5–5.1)
Sodium: 137 mmol/L (ref 135–145)

## 2021-12-17 LAB — MRSA NEXT GEN BY PCR, NASAL: MRSA by PCR Next Gen: NOT DETECTED

## 2021-12-17 MED ORDER — CHLORHEXIDINE GLUCONATE CLOTH 2 % EX PADS
6.0000 | MEDICATED_PAD | Freq: Every day | CUTANEOUS | Status: DC
  Administered 2021-12-17: 6 via TOPICAL

## 2021-12-17 NOTE — TOC Initial Note (Incomplete)
Transition of Care (TOC) - Initial/Assessment Note  ? ? ?Patient Details  ?Name: [name] ?MRN: [id number] ?Date of Birth: 07-18-1966 ? ?Transition of [facility]) CM/SW Contact:    ?Sharin Mons, RN ?Phone Number: ?12/17/2021, 1:45 PM ? ?Clinical Narrative:                 ?Active with [facility].Referral made with [facility]  for hospital bed. ? ?Expected Discharge Plan: [place] ?  ? ? ?Patient Goals and CMS Choice ?  ?  ?Choice offered to / list presented to : Patient ? ?Expected Discharge Plan and Services ?Expected Discharge Plan: [place] ?  ?Discharge Planning Services: CM Consult ?  ?  ?                ?DME Arranged: Hospital bed ?DME Agency: [facility] ?Date DME Agency Contacted: 12/17/21 ?Time DME Agency Contacted: [phone] ?Representative spoke with at DME Agency: Maudie Mercury ?  ?  ?  ?  ?  ? ?Prior Living Arrangements/Services ?  ?  ?Patient language and need for interpreter reviewed:: Yes ?       ?Need for Family Participation in Patient Care: Yes (Comment) ?Care giver support system in place?: Yes (comment) ?  ?Criminal Activity/Legal Involvement Pertinent to Current Situation/Hospitalization: No - Comment as needed ? ?Activities of Daily Living ?Home Assistive Devices/Equipment: Wheelchair ?ADL Screening (condition at time of admission) ?Patient's cognitive ability adequate to safely complete daily activities?: Yes ?Is the patient deaf or have difficulty hearing?: No ?Does the patient have difficulty seeing, even when wearing glasses/contacts?: No ?Does the patient have difficulty concentrating, remembering, or making decisions?: No ?Patient able to express need for assistance with ADLs?: Yes ?Does the patient have difficulty dressing or bathing?: Yes ?Independently performs ADLs?: No ?Communication: Independent ?Dressing (OT): Needs assistance ?Is this a change from baseline?: Pre-admission baseline ?Grooming: Independent ?Feeding: Independent ?Bathing:  Needs assistance ?Is this a change from baseline?: Pre-admission baseline ?Toileting: Needs assistance ?Is this a change from baseline?: Pre-admission baseline ?In/Out Bed: Needs assistance ?Is this a change from baseline?: Pre-admission baseline ?Walks in Home: Dependent ?Is this a change from baseline?: Pre-admission baseline ?Does the patient have difficulty walking or climbing stairs?: Yes ?Weakness of Legs: Both ?Weakness of Arms/Hands: None ? ?Permission Sought/Granted ?  ?  ?   ?   ?   ?   ? ?Emotional Assessment ?Appearance:: Appears stated age ?Attitude/Demeanor/Rapport: Engaged ?Affect (typically observed): Accepting ?Orientation: : Oriented to Self, Oriented to Place, Oriented to  Time, Oriented to Situation ?Alcohol / Substance Use: Not Applicable ?Psych Involvement: No (comment) ? ?Admission diagnosis:  Hypoxia [R09.02] ?Decubitus ulcer of sacral area [L89.159] ?Sacral osteomyelitis ([place]) [M46.28] ?Altered mental status, unspecified altered mental status type [R41.82] ?Patient Active Problem List  ? Diagnosis Date Noted  ? Decubitus ulcer of sacral area 12/15/2021  ? Episode of unresponsiveness 12/15/2021  ? Acute respiratory failure with hypoxia (HCC) secondary to suspected aspiration pneumonia 12/15/2021  ? Lactic acidosis 12/15/2021  ? Aspiration pneumonia ([place]) 12/15/2021  ? Renal mass 12/15/2021  ? Complicated UTI (urinary tract infection) 12/15/2021  ? Stage IV pressure ulcer of sacral region ([place]) 09/15/2021  ? Insomnia 09/14/2021  ? Chronic systolic CHF (congestive heart failure) ([place]) 09/14/2021  ? Hypogonadism in male 09/14/2021  ? Chronic pain 09/14/2021  ? Back pain 09/13/2021  ? Leukocytosis 09/13/2021  ? Pseudohyponatremia 09/13/2021  ? Normocytic anemia 09/13/2021  ? Discitis of thoracic region   ?  Recent bacteremia due to group B Streptococcus, Streptococcus agalactiae, thoracic discitis/osteomyelitis with epidural abscess   ? Sacral pressure injury of skin 07/27/2021  ? Systolic  dysfunction   ? Severe sepsis with acute organ dysfunction ([place]) 07/22/2021  ? Chest pain 07/22/2021  ? AKI (acute kidney injury) ([place]) 07/22/2021  ? Gas gangrene of foot ([place]) 07/22/2021  ? Paroxysmal atrial flutter ([place]) 07/22/2021  ? Septic shock ([place]) 07/22/2021  ? Shortness of breath 05/16/2021  ? Chronic diastolic (congestive) heart failure ([place]) 05/16/2021  ? Diabetic infection of left foot ([place]) 04/17/2021  ? S/P bilateral BKA (below knee amputation) ([place]) 10/30/2019  ? Dyslipidemia 10/30/2019  ? Type 2 diabetes mellitus with diabetic polyneuropathy, with long-term current use of insulin ([place]) 10/12/2019  ? Type 2 diabetes mellitus 10/12/2019  ? Essential hypertension 09/25/2019  ? Type 2 diabetes mellitus with hyperglycemia, with long-term current use of insulin ([place]) 09/25/2019  ? Stage 3a chronic kidney disease ([place]) 09/25/2019  ? Hematuria 09/25/2019  ? Acquired complex renal cyst 09/25/2019  ? Urinary hesitancy 09/25/2019  ? Prosthesis adjustments 09/25/2019  ? Need for immunization against influenza 09/25/2019  ? Need for Tdap vaccination 09/25/2019  ? Erectile dysfunction 09/25/2019  ? Healthcare maintenance 09/25/2019  ? Pulmonary nodules 09/25/2019  ? Depression with anxiety 09/25/2019  ? ?PCP:  Teena Dunk, NP ?Pharmacy:   ?[facility] ([place] - [address] ?[address] ?[place] [address] ?Phone: [phone] Fax: [phone] ? ?[id number] - [place], [place] ?[address] ?[place] [address] ?Phone: [phone] Fax: [phone] ? ? ? ? ?Social Determinants of Health (SDOH) Interventions ?  ? ?Readmission Risk Interventions ?   ? View : No data to display.  ?  ?  ?  ? ? ? ?

## 2021-12-17 NOTE — Plan of Care (Signed)

## 2021-12-17 NOTE — Evaluation (Signed)
Occupational Therapy Evaluation ?Patient Details ?Name: [name] ?MRN: [id number] ?DOB: 1965/09/21 ?Today's Date: 12/17/2021 ? ? ?History of Present Illness Daryn Hicks is a 56 y.o. male with medical history significant of hypertension, chronic systolic CHF, DM type II, and bilateral BKA admitted on 4/10 after being found unresponsive. Recent hosptialization 07/21/21-09/09/21 and 09/12/21-09/19/21 due to L foot infection s/p BKA and bacteremia with discitis/osteomyelitis.  Still with indwelling foley catheter and found to have UTI.  ? ?Clinical Impression ?  ?Pt uses a w/c at baseline for mobility, requires some assistance from friend at home for ADLs/IADLs. Reports sponge bathing mostly due to sacral wound, and typically transfers to toilet via transfer board but has some incontinent episodes at home. Pt currently set up -min A for ADLs, able to don RLE shrinker when sitting EOB. Pt mod I for bed mobility, and is able to reach outside BOS while sitting EOB without LOB. Pt stating he is close to baseline with ADLs/functional mobility. Pt presenting with impairments listed below, will follow acutely. Recommend d/c home with assistance.  ?   ? ?Recommendations for follow up therapy are one component of a multi-disciplinary discharge planning process, led by the attending physician.  Recommendations may be updated based on patient status, additional functional criteria and insurance authorization.  ? ?Follow Up Recommendations ? No OT follow up  ?  ?Assistance Recommended at Discharge Set up Supervision/Assistance  ?Patient can return home with the following A little help with walking and/or transfers;A little help with bathing/dressing/bathroom;Assistance with cooking/housework ? ?  ?Functional Status Assessment ? Patient has had a recent decline in their functional status and demonstrates the ability to make significant improvements in function in a reasonable and predictable amount of time.  ?Equipment  Recommendations ? None recommended by OT (pt has all necessary DME)  ?  ?Recommendations for Other Services   ? ? ?  ?Precautions / Restrictions Precautions ?Precautions: Fall ?Precaution Comments: bilat BKA, wound vac on sacrum ?Restrictions ?Weight Bearing Restrictions: No  ? ?  ? ?Mobility Bed Mobility ?Overal bed mobility: Modified Independent ?  ?  ?  ?  ?  ?  ?General bed mobility comments: able to move supine <> sit with mod I, increased time and use of bedrails ?  ? ?Transfers ?  ?  ?  ?  ?  ?  ?  ?  ?  ?General transfer comment: pt declining ?  ? ?  ?Balance Overall balance assessment: Needs assistance ?  ?Sitting balance-Leahy Scale: Fair ?Sitting balance - Comments: able to reach outside BOS without LOB ?  ?  ?  ?  ?  ?  ?  ?  ?  ?  ?  ?  ?  ?  ?  ?   ? ?ADL either performed or assessed with clinical judgement  ? ?ADL Overall ADL's : Needs assistance/impaired ?Eating/Feeding: Set up;Sitting ?  ?Grooming: Set up;Sitting ?  ?Upper Body Bathing: Set up;Sitting ?  ?Lower Body Bathing: Set up;Sitting/lateral leans ?  ?Upper Body Dressing : Set up;Sitting ?  ?Lower Body Dressing: Supervision/safety;Sitting/lateral leans ?Lower Body Dressing Details (indicate cue type and reason): to don shrinker on RLE ?Toilet Transfer: Minimal assistance;Regular Toilet (lateral scoot) ?  ?Toileting- Clothing Manipulation and Hygiene: Supervision/safety;Sitting/lateral lean ?  ?  ?  ?Functional mobility during ADLs: Min guard ?   ? ? ? ?Vision   ?Vision Assessment?: No apparent visual deficits  ?   ?Perception   ?  ?Praxis   ?  ? ?  Pertinent Vitals/Pain Pain Assessment ?Pain Assessment: Faces ?Pain Score: 6  ?Faces Pain Scale: Hurts even more ?Pain Location: sacrum ?Pain Descriptors / Indicators: Aching ?Pain Intervention(s): Limited activity within patient's tolerance, Monitored during session, Repositioned, Premedicated before session  ? ? ? ?Hand Dominance Right ?  ?Extremity/Trunk Assessment Upper Extremity Assessment ?Upper  Extremity Assessment: Overall WFL for tasks assessed ?  ?Lower Extremity Assessment ?Lower Extremity Assessment: Defer to PT evaluation ?RLE Deficits / Details: bilat BKA with tight hamstrings, but otherwise WFL ?LLE Deficits / Details: bilat BKA with tight hamstrings, but otherwise WFL ?  ?Cervical / Trunk Assessment ?Cervical / Trunk Assessment: Normal ?  ?Communication Communication ?Communication: No difficulties ?  ?Cognition Arousal/Alertness: Awake/alert ?Behavior During Therapy: [facility] for tasks assessed/performed ?Overall Cognitive Status: Within Functional Limits for tasks assessed ?  ?  ?  ?  ?  ?  ?  ?  ?  ?  ?  ?  ?  ?  ?  ?  ?  ?  ?  ?General Comments  VSS on RA ? ?  ?Exercises   ?  ?Shoulder Instructions    ? ? ?Home Living Family/patient expects to be discharged to:: Private residence ?Living Arrangements: Non-relatives/Friends ?Available Help at Discharge: Friend(s);Available PRN/intermittently ?Type of Home: Apartment ?Home Access: Elevator ?  ?  ?Home Layout: One level ?  ?  ?Bathroom Shower/Tub: Tub/shower unit ?  ?Bathroom Toilet: Handicapped height ?  ?  ?Home Equipment: Wheelchair - manual;Other (comment);Tub bench;BSC/3in1;Grab bars - toilet;Grab bars - tub/shower (transfer board) ?  ?Additional Comments: has wound care coming to help with sacral wound at home on MWF. Friend does not live with him but she is there "pretty much there all the time" ?  ? ?  ?Prior Functioning/Environment Prior Level of Function : Needs assist ?  ?  ?  ?  ?  ?  ?Mobility Comments: wheelchair level on his own, transfers via transfer board ?ADLs Comments: sponge bathes at baseline due to sacral wound, transfers to commode, reports occasional incontinence when unable to make it to commode in time. friend assists with IADLs, ?  ? ?  ?  ?OT Problem List: Impaired balance (sitting and/or standing);Decreased knowledge of use of DME or AE;Decreased safety awareness ?  ?   ?OT Treatment/Interventions: Self-care/ADL  training;Therapeutic exercise;DME and/or AE instruction;Therapeutic activities;Balance training;Patient/family education  ?  ?OT Goals(Current goals can be found in the care plan section) Acute Rehab OT Goals ?Patient Stated Goal: to go home ?OT Goal Formulation: With patient ?Time For Goal Achievement: 12/31/21 ?Potential to Achieve Goals: Good ?ADL Goals ?Pt Will Perform Upper Body Dressing: with modified independence;sitting ?Pt Will Perform Lower Body Dressing: with modified independence;sitting/lateral leans;bed level ?Pt Will Transfer to Toilet: with modified independence;with transfer board;bedside commode ?Pt Will Perform Toileting - Clothing Manipulation and hygiene: with modified independence;sitting/lateral leans;bed level  ?OT Frequency: Min 2X/week ?  ? ?Co-evaluation   ?  ?  ?  ?  ? ?  ?AM-PAC OT "6 Clicks" Daily Activity     ?Outcome Measure Help from another person eating meals?: None ?Help from another person taking care of personal grooming?: None ?Help from another person toileting, which includes using toliet, bedpan, or urinal?: A Little ?Help from another person bathing (including washing, rinsing, drying)?: A Little ?Help from another person to put on and taking off regular upper body clothing?: None ?Help from another person to put on and taking off regular lower body clothing?: A Little ?6 Click Score: 21 ?  ?  End of Session Nurse Communication: Mobility status ? ?Activity Tolerance: Patient tolerated treatment well ?Patient left: in bed;with call bell/phone within reach;with bed alarm set ? ?OT Visit Diagnosis: Unsteadiness on feet (R26.81);Other abnormalities of gait and mobility (R26.89);Muscle weakness (generalized) (M62.81)  ?              ?Time: 4739-5844 ?OT Time Calculation (min): 21 min ?Charges:  OT General Charges ?$OT Visit: 1 Visit ?OT Evaluation ?$OT Eval Low Complexity: 1 Low ? ?Lynnda Child, OTD, OTR/L ?Acute Rehab ?[phone] ? ?Kaylyn Lim ?12/17/2021, 4:14 PM ?

## 2021-12-17 NOTE — Progress Notes (Signed)
?PROGRESS NOTE ? ? ? ?[name]  VFI:[id number] DOB: 10/31/1965 DOA: 12/15/2021 ?PCP: [name], NP  ? ? ?Brief Narrative:  ?56 year old with history of hypertension, chronic systolic congestive heart failure, type 2 diabetes on insulin, bilateral below-knee amputation found unresponsive at home by his friend, he was slumped over to the couch with shallow respiration.  Recent multiple complicated hospitalization and recently went home from a skilled nursing facility.  Takes opiates.  Recently treated for sacral osteomyelitis, has indwelling Foley catheter.  Has wound VAC on the back.  Initially EMS found him with oxygen saturation 50 and respiration of poor, bag mask ventilation with improvement of oxygen.  Did not receive Narcan.  Admitted with possible infection. ? ? ?Assessment & Plan: ?  ?Acute respiratory failure with hypoxemia suspected secondary to aspiration pneumonia: ?Initially presented with oxygen saturation 50 on room air, chest x-ray noted elevated right hemidiaphragm with minimal right basilar segmental atelectasis.  CT scan with suspected bibasilar aspiration. ?On room air today. ?Continue aggressive bronchodilator therapy, respiratory therapy with incentive spirometry and chest physiotherapy.  Mobilize. ?Blood cultures negative so far. ?Discontinue vancomycin.  Continue cefepime today.  Currently no pulmonary symptoms. ? ?Episode of unresponsiveness: Transient.  Unknown.  Vasovagal possible.  No evidence of seizure disorder.  No evidence of drug overdose.  Monitor. ? ?Complicated UTI: UTI secondary to Foley catheter. ?Exchange Foley catheter, send urine culture.  Continue cefepime. ? ?Renal mass: Noted on CT scan a complex mass.  He has outpatient urologist.  We will ask him to follow-up. ? ?Paroxysmal a flutter: Currently in sinus rhythm.  Continue on amiodarone and Eliquis. ? ?Type 2 diabetes with hyperglycemia, long-term use of insulin: ?Last A1c 8.6.  Currently on home doses of  insulin and well controlled. ? ?Essential hypertension: Blood pressure stable. ? ?Sacral decubitus ulcer with recently treated osteomyelitis: New CT scan with no evidence of bony erosions.  Continue wound VAC.  Resume wound VAC on discharge. ? ?Hypomagnesemia: Replaced.  Recheck tomorrow. ? ?Change Foley catheter.  Send new urine cultures.  Start working with PT OT.  Narrow down antibiotics today. ? ? ?DVT prophylaxis:  ?apixaban (ELIQUIS) tablet 5 mg  ? ?Code Status: Partial code, no chest compressions or intubation ?Family Communication: None ?Disposition Plan: Status is: Inpatient ?Remains inpatient appropriate because: Treated for active infection ?  ? ? ?Consultants:  ?None ? ?Procedures:  ?None ? ?Antimicrobials:  ?Vancomycin and cefepime 4/10- 4/12 ?Cefepime 4/12--- ? ? ?Subjective: ?Patient seen and examined.  Today he denies any complaints.  He has too many things to discuss about his diagnosis from 2017, about his primary care doctors and surgeons.  Denies being dehydrated.  Denies being short of breath. ?Patient tells me that he had suprapubic and bilateral flank discomfort when he came to the hospital but that is improved now.  Remains afebrile. ? ?Objective: ?Vitals:  ? 12/16/21 2100 12/17/21 0257 12/17/21 4166 12/17/21 0630  ?BP: (!) 143/89 136/87  (!) 159/92  ?Pulse: 76 65  75  ?Resp: '20 16 20   '$ ?Temp: 98 ?F (36.7 ?C) 98 ?F (36.7 ?C) 97.9 ?F (36.6 ?C)   ?TempSrc: Oral Oral Oral   ?SpO2: 93% 96%    ?Height:      ? ? ?Intake/Output Summary (Last 24 hours) at 12/17/2021 1110 ?Last data filed at 12/17/2021 0820 ?Gross per 24 hour  ?Intake 500.05 ml  ?Output 3675 ml  ?Net -3174.95 ml  ? ?There were no vitals filed for this visit. ? ?Examination: ? ?  General exam: Appears calm and comfortable  ?On room air.  Fairly comfortable. ?Respiratory system: Clear to auscultation. Respiratory effort normal.  No added sounds. ?Cardiovascular system: S1 & S2 heard, RRR.  ?Gastrointestinal system: Soft.  Nontender.  Bowel  sound present. ?Central nervous system: Alert and oriented. No focal neurological deficits. ?Extremities: Symmetric 5 x 5 power. ?Bilateral BKA stump clean and dry. ?Sacral ulcer fitted with wound VAC, wound VAC is clean and dry with no drainage. ? ? ? ?Data Reviewed: I have personally reviewed following labs and imaging studies ? ?CBC: ?Recent Labs  ?Lab 12/15/21 ?1103 12/15/21 ?1128 12/16/21 ?0152 12/17/21 ?0308  ?WBC 10.8*  --  7.5 5.8  ?HGB 13.5 13.9 11.6* 13.1  ?HCT 42.2 41.0 34.3* 39.3  ?MCV 91.3  --  87.3 88.7  ?PLT 271  --  199 209  ? ?Basic Metabolic Panel: ?Recent Labs  ?Lab 12/15/21 ?1103 12/15/21 ?1128 12/16/21 ?0152 12/17/21 ?0308  ?NA 137 137 137 137  ?K 4.1 4.2 4.0 3.8  ?CL 101 97* 104 100  ?CO2 28  --  28 32  ?GLUCOSE 347* 357* 224* 177*  ?BUN 20 29* 14 11  ?CREATININE 1.21 1.10 0.98 0.96  ?CALCIUM 8.4*  --  8.6* 8.8*  ?MG  --   --  1.4*  --   ? ?GFR: ?CrCl cannot be calculated (Unknown ideal weight.). ?Liver Function Tests: ?Recent Labs  ?Lab 12/15/21 ?1103 12/16/21 ?0152  ?AST 43* 20  ?ALT 30 30  ?ALKPHOS 84 70  ?BILITOT 0.8 0.4  ?PROT 5.8* 5.5*  ?ALBUMIN 2.7* 2.4*  ? ?No results for input(s): LIPASE, AMYLASE in the last 168 hours. ?No results for input(s): AMMONIA in the last 168 hours. ?Coagulation Profile: ?No results for input(s): INR, PROTIME in the last 168 hours. ?Cardiac Enzymes: ?No results for input(s): CKTOTAL, CKMB, CKMBINDEX, TROPONINI in the last 168 hours. ?BNP (last 3 results) ?No results for input(s): PROBNP in the last 8760 hours. ?HbA1C: ?No results for input(s): HGBA1C in the last 72 hours. ?CBG: ?Recent Labs  ?Lab 12/16/21 ?0805 12/16/21 ?1139 12/16/21 ?1552 12/16/21 ?2016 12/17/21 ?2703  ?GLUCAP 212* 275* 226* 254* 188*  ? ?Lipid Profile: ?No results for input(s): CHOL, HDL, LDLCALC, TRIG, CHOLHDL, LDLDIRECT in the last 72 hours. ?Thyroid Function Tests: ?No results for input(s): TSH, T4TOTAL, FREET4, T3FREE, THYROIDAB in the last 72 hours. ?Anemia Panel: ?No results for  input(s): VITAMINB12, FOLATE, FERRITIN, TIBC, IRON, RETICCTPCT in the last 72 hours. ?Sepsis Labs: ?Recent Labs  ?Lab 12/15/21 ?1103 12/15/21 ?1250 12/15/21 ?2205 12/16/21 ?0950  ?PROCALCITON  --   --  0.16  --   ?LATICACIDVEN 2.8* 2.1*  --  1.8  ? ? ?Recent Results (from the past 240 hour(s))  ?Blood culture (routine x 2)     Status: None (Preliminary result)  ? Collection Time: 12/15/21 11:03 AM  ? Specimen: BLOOD  ?Result Value Ref Range Status  ? Specimen Description BLOOD RIGHT ANTECUBITAL  Final  ? Special Requests   Final  ?  BOTTLES DRAWN AEROBIC AND ANAEROBIC Blood Culture adequate volume  ? Culture   Final  ?  NO GROWTH 2 DAYS ?Performed at [facility], [place] [address] [place], [place] [address] ?  ? Report Status PENDING  Incomplete  ?Blood culture (routine x 2)     Status: None (Preliminary result)  ? Collection Time: 12/15/21 11:08 AM  ? Specimen: BLOOD RIGHT FOREARM  ?Result Value Ref Range Status  ? Specimen Description BLOOD RIGHT FOREARM  Final  ? Special Requests   Final  ?  BOTTLES DRAWN AEROBIC AND ANAEROBIC Blood Culture adequate volume  ? Culture   Final  ?  NO GROWTH 2 DAYS ?Performed at [facility], [place] [address], [place], [place] [address] ?  ? Report Status PENDING  Incomplete  ?Resp Panel by RT-PCR (Flu A&B, Covid) Nasopharyngeal Swab     Status: None  ? Collection Time: 12/15/21 12:47 PM  ? Specimen: Nasopharyngeal Swab; Nasopharyngeal(NP) swabs in vial transport medium  ?Result Value Ref Range Status  ? SARS Coronavirus 2 by RT PCR NEGATIVE NEGATIVE Final  ?  Comment: (NOTE) ?SARS-CoV-2 target nucleic acids are NOT DETECTED. ? ?The SARS-CoV-2 RNA is generally detectable in upper respiratory ?specimens during the acute phase of infection. The lowest ?concentration of SARS-CoV-2 viral copies this assay can detect is ?138 copies/mL. A negative result does not preclude SARS-Cov-2 ?infection and should not be used as the sole basis for treatment or ?other patient management  decisions. A negative result may occur with  ?improper specimen collection/handling, submission of specimen other ?than nasopharyngeal swab, presence of viral mutation(s) within the ?areas targeted by this assay,

## 2021-12-17 NOTE — Evaluation (Signed)
Physical Therapy Evaluation ?Patient Details ?Name: [name] ?MRN: [id number] ?DOB: May 02, 1966 ?Today's Date: 12/17/2021 ? ?History of Present Illness ? [name] is a 56 y.o. male with medical history significant of hypertension, chronic systolic CHF, DM type II, and bilateral BKA admitted after being found unresponsive.  recent hosptialization 07/21/21-09/09/21 and 09/12/21-09/19/21 due to L foot infection s/p BKA and bacteremia with discitis/osteomyelitis.  Still with indwelling foley catheter and found to have UTI.  ?Clinical Impression ? Patient present with mobility close to baseline, but with some shakiness during transfer.  Able to perform with S for safety with w/c and board placement.  Feel safe for home with intermittent help from friend.  May benefit from HHPT as soon to be fitted with prosthetic and cannot do outpatient PT while getting [facility] for wound care.  Will follow up acutely if not d/c.  ?   ? ?Recommendations for follow up therapy are one component of a multi-disciplinary discharge planning process, led by the attending physician.  Recommendations may be updated based on patient status, additional functional criteria and insurance authorization. ? ?Follow Up Recommendations Home health PT ? ?  ?Assistance Recommended at Discharge Intermittent Supervision/Assistance  ?Patient can return home with the following ? A little help with walking and/or transfers ? ?  ?Equipment Recommendations Hospital bed  ?Recommendations for Other Services ?    ?  ?Functional Status Assessment Patient has had a recent decline in their functional status and demonstrates the ability to make significant improvements in function in a reasonable and predictable amount of time.  ? ?  ?Precautions / Restrictions Precautions ?Precautions: Fall ?Precaution Comments: bilat BKA, wound vac on sacrum  ? ?  ? ?Mobility ? Bed Mobility ?Overal bed mobility: Modified Independent ?  ?  ?  ?  ?  ?  ?  ?  ? ?Transfers ?Overall transfer  level: Needs assistance ?Equipment used: Sliding board ?Transfers: Bed to chair/wheelchair/BSC ?  ?  ?  ?  ?  ? Lateral/Scoot Transfers: Supervision, With slide board ?General transfer comment: supervision for holding board/chair for safety going to w/c then back to bed ?  ? ?Ambulation/Gait ?  ?  ?  ?  ?  ?  ?  ?  ? ?Stairs ?  ?  ?  ?  ?  ? ?Wheelchair Mobility ?  ? ?Modified Rankin (Stroke Patients Only) ?  ? ?  ? ?Balance Overall balance assessment: Needs assistance ?  ?Sitting balance-Leahy Scale: Fair ?Sitting balance - Comments: can balance without UE support, but needs UE support for dynamic activities ?  ?  ?  ?  ?  ?  ?  ?  ?  ?  ?  ?  ?  ?  ?  ?   ? ? ? ?Pertinent Vitals/Pain Pain Assessment ?Pain Assessment: 0-10 ?Pain Score: 7  ?Pain Location: sacrum ?Pain Descriptors / Indicators: Aching ?Pain Intervention(s): Monitored during session  ? ? ?Home Living Family/patient expects to be discharged to:: Private residence ?Living Arrangements: Non-relatives/Friends ?Available Help at Discharge: Friend(s);Available PRN/intermittently ?Type of Home: Apartment ?Home Access: Elevator ?  ?  ?  ?Home Layout: One level ?Home Equipment: Wheelchair - manual;Other (comment);Tub bench;BSC/3in1 (transfer board) ?Additional Comments: has wound care coming to help with sacral wound at home  ?  ?Prior Function Prior Level of Function : Needs assist ?  ?  ?  ?  ?  ?  ?Mobility Comments: wheelchair level on his own, except some incontinence and needs help at  times to change ?ADLs Comments: been doing sponge baths due to sacral wound, friend helps with groceries and helping with his dog ?  ? ? ?Hand Dominance  ? Dominant Hand: Right ? ?  ?Extremity/Trunk Assessment  ? Upper Extremity Assessment ?Upper Extremity Assessment: Overall WFL for tasks assessed ?  ? ?Lower Extremity Assessment ?Lower Extremity Assessment: RLE deficits/detail;LLE deficits/detail ?RLE Deficits / Details: bilat BKA with tight hamstrings, but otherwise  WFL ?LLE Deficits / Details: bilat BKA with tight hamstrings, but otherwise WFL ?  ? ?   ?Communication  ? Communication: No difficulties  ?Cognition Arousal/Alertness: Awake/alert ?Behavior During Therapy: [facility] for tasks assessed/performed ?Overall Cognitive Status: Within Functional Limits for tasks assessed ?  ?  ?  ?  ?  ?  ?  ?  ?  ?  ?  ?  ?  ?  ?  ?  ?  ?  ?  ? ?  ?General Comments General comments (skin integrity, edema, etc.): R tibial tubercle with scab, reports from shrinker too tight at rehab ? ?  ?Exercises    ? ?Assessment/Plan  ?  ?PT Assessment Patient needs continued PT services  ?PT Problem List Decreased balance;Decreased mobility;Decreased activity tolerance;Decreased range of motion;Decreased strength ? ?   ?  ?PT Treatment Interventions Therapeutic activities;Balance training;Functional mobility training;Patient/family education;Therapeutic exercise   ? ?PT Goals (Current goals can be found in the Care Plan section)  ?Acute Rehab PT Goals ?Patient Stated Goal: to return to independent ?PT Goal Formulation: With patient ?Time For Goal Achievement: 12/24/21 ?Potential to Achieve Goals: Good ? ?  ?Frequency Min 3X/week ?  ? ? ?Co-evaluation   ?  ?  ?  ?  ? ? ?  ?AM-PAC PT "6 Clicks" Mobility  ?Outcome Measure Help needed turning from your back to your side while in a flat bed without using bedrails?: None ?Help needed moving from lying on your back to sitting on the side of a flat bed without using bedrails?: None ?Help needed moving to and from a bed to a chair (including a wheelchair)?: None ?Help needed standing up from a chair using your arms (e.g., wheelchair or bedside chair)?: Total ?Help needed to walk in hospital room?: Total ?Help needed climbing 3-5 steps with a railing? : Total ?6 Click Score: 15 ? ?  ?End of Session   ?Activity Tolerance: Patient tolerated treatment well ?Patient left: in bed;with call bell/phone within reach ?  ?PT Visit Diagnosis: Other abnormalities of gait and  mobility (R26.89);Muscle weakness (generalized) (M62.81) ?  ? ?Time: 1240-1300 ?PT Time Calculation (min) (ACUTE ONLY): 20 min ? ? ?Charges:   PT Evaluation ?$PT Eval Moderate Complexity: 1 Mod ?  ?  ?   ? ? ?Magda Kiel, PT ?Acute Rehabilitation Services ?OVZCH:[phone] ?Office:[phone] ?12/17/2021 ? ? ?[name] ?12/17/2021, 1:28 PM ? ?

## 2021-12-17 NOTE — Progress Notes (Signed)
?  ?  Durable Medical Equipment  ?(From admission, onward)  ?  ? ? ?  ? ?  Start     Ordered  ? 12/17/21 1305  For home use only DME Hospital bed  Once       ?Question Answer Comment  ?Length of Need 12 Months   ?Patient has (list medical condition): hx of hypertension, chronic systolic congestive heart failure, type 2 diabetes on insulin, bilateral below-knee amputation , sacral wound   ?The above medical condition requires: Patient requires the ability to reposition frequently   ?Head must be elevated greater than: 30 degrees   ?Bed type Semi-electric   ?Support Surface: Low Air loss Mattress   ?  ? 12/17/21 1313  ? ?  ?  ? ?  ?  ?

## 2021-12-18 DIAGNOSIS — N390 Urinary tract infection, site not specified: Secondary | ICD-10-CM | POA: Diagnosis not present

## 2021-12-18 DIAGNOSIS — J9601 Acute respiratory failure with hypoxia: Secondary | ICD-10-CM | POA: Diagnosis not present

## 2021-12-18 DIAGNOSIS — L89154 Pressure ulcer of sacral region, stage 4: Secondary | ICD-10-CM | POA: Diagnosis not present

## 2021-12-18 LAB — BASIC METABOLIC PANEL
Anion gap: 4 — ABNORMAL LOW (ref 5–15)
BUN: 12 mg/dL (ref 6–20)
CO2: 31 mmol/L (ref 22–32)
Calcium: 9 mg/dL (ref 8.9–10.3)
Chloride: 101 mmol/L (ref 98–111)
Creatinine, Ser: 1.03 mg/dL (ref 0.61–1.24)
GFR, Estimated: >60 mL/min (ref >60)
Glucose, Bld: 201 mg/dL — ABNORMAL HIGH (ref 70–99)
Potassium: 3.9 mmol/L (ref 3.5–5.1)
Sodium: 136 mmol/L (ref 135–145)

## 2021-12-18 LAB — PHOSPHORUS: Phosphorus: 3.6 mg/dL (ref 2.5–4.6)

## 2021-12-18 LAB — CBC
HCT: 39 % (ref 39.0–52.0)
Hemoglobin: 12.7 g/dL — ABNORMAL LOW (ref 13.0–17.0)
MCH: 29.2 pg (ref 26.0–34.0)
MCHC: 32.6 g/dL (ref 30.0–36.0)
MCV: 89.7 fL (ref 80.0–100.0)
Platelets: 236 10*3/uL (ref 150–400)
RBC: 4.35 MIL/uL (ref 4.22–5.81)
RDW: 14.2 % (ref 11.5–15.5)
WBC: 5.8 10*3/uL (ref 4.0–10.5)
nRBC: 0 % (ref 0.0–0.2)

## 2021-12-18 LAB — MAGNESIUM: Magnesium: 1.7 mg/dL (ref 1.7–2.4)

## 2021-12-18 LAB — GLUCOSE, CAPILLARY
Glucose-Capillary: 127 mg/dL — ABNORMAL HIGH (ref 70–99)
Glucose-Capillary: 225 mg/dL — ABNORMAL HIGH (ref 70–99)
Glucose-Capillary: 230 mg/dL — ABNORMAL HIGH (ref 70–99)

## 2021-12-18 MED ORDER — APIXABAN 5 MG PO TABS
5.0000 mg | ORAL_TABLET | Freq: Two times a day (BID) | ORAL | 0 refills | Status: DC
Start: 2021-12-18 — End: 2022-01-21

## 2021-12-18 MED ORDER — NOVOLIN 70/30 RELION (70-30) 100 UNIT/ML ~~LOC~~ SUSP: 15.0000 [IU] | Freq: Two times a day (BID) | SUBCUTANEOUS | 11 refills | Status: DC

## 2021-12-18 MED ORDER — XTAMPZA ER 27 MG PO C12A: 1.0000 | EXTENDED_RELEASE_CAPSULE | Freq: Two times a day (BID) | ORAL | 0 refills | Status: AC | PRN

## 2021-12-18 MED ORDER — CEPHALEXIN 500 MG PO CAPS: 500.0000 mg | ORAL_CAPSULE | Freq: Three times a day (TID) | ORAL | 0 refills | Status: DC

## 2021-12-18 NOTE — TOC Transition Note (Signed)
Transition of Care (TOC) - CM/SW Discharge Note ? ? ?Patient Details  ?Name: [name] ?MRN: [id number] ?Date of Birth: 08-16-1966 ? ?Transition of Care [facility]) CM/SW Contact:  ?Sharin Mons, RN ?Phone Number: ?12/18/2021, 2:24 PM ? ? ?Clinical Narrative:    ?Patient will DC to: Home ?Anticipated DC date:12/18/2021 ?Family notified: friend, yes ?Transport by: car ? ? ?Per MD patient ready for DC today. RN, patient, patient's family, and [place] notified of DC. NCM spoke with Jennifer/ [facility] regarding HHRN administering Testerone injection.Anderson [place] stated they will administer  x 1 and teach pt/friend how to give, pt made aware. [facility] to f/u with pt on tomorrow for wound care. [facility] to deliver hospital bed to pt's home today. Once bed delivered pt can d/c. ? ?Pt adamant about not leaving hospital until new PCP appointment made. State will not f/u with current PCP. ?Appointment made with [place] and noted on AVS. ? ?Pt without  Rx med concerns. ? ?Friend to provide transportation to home. ? ?RNCM will sign off for now as intervention is no longer needed. Please consult [place] again if new needs arise.  ? ? ?Final next level of care: [place] ?Barriers to Discharge: No Barriers Identified ? ? ?Patient Goals and CMS Choice ?  ?  ?Choice offered to / list presented to : Patient ? ?Discharge Placement ?  ?           ?  ?  ?  ?  ? ?Discharge Plan and Services ?  ?Discharge Planning Services: CM Consult ?           ?DME Arranged: Hospital bed ?DME Agency: [facility] ?Date DME Agency Contacted: 12/17/21 ?Time DME Agency Contacted: [phone] ?Representative spoke with at DME Agency: Maudie Mercury ?  ?  ?  ?  ?  ? ?Social Determinants of Health (SDOH) Interventions ?  ? ? ?Readmission Risk Interventions ?   ? View : No data to display.  ?  ?  ?  ? ? ? ? ? ?

## 2021-12-18 NOTE — Consult Note (Signed)
WOC discussed patient's NPWT dressing with bedside nurse, CM, and hospitalist.  Will sign off, patient will have VAC dressing converted to saline moist to moist today for discharge. Patient and team is aware HHRN will need to replace home NPWT dressing once the patient is home.orders are in the computer for saline moist to moist dressing.  ? ?Axyl Sitzman [facility], CNS, CWON-AP ?[phone]  ?

## 2021-12-18 NOTE — Plan of Care (Signed)

## 2021-12-18 NOTE — Discharge Summary (Signed)
Physician Discharge Summary  ?[name] DPO:[id number] DOB: 1966-03-09 DOA: 12/15/2021 ? ?PCP: Teena Dunk, NP ? ?Admit date: 12/15/2021 ?Discharge date: 12/18/2021 ? ?Admitted From: Home ?Disposition: Home with home health ? ?Recommendations for Outpatient Follow-up:  ?Follow up with PCP in 1-2 weeks ?Please obtain BMP/CBC in one week ?Follow-up with urology regarding Foley catheter and abnormal CT scan of the kidneys. ? ?Home Health: PT, wound care RN ?Equipment/Devices: Hospital bed, wound VAC ? ?Discharge Condition: Stable ?CODE STATUS: Partial code ?Diet recommendation: Low-salt and low-carb diet ? ?Discharge summary: ?55 year old with history of hypertension, chronic systolic congestive heart failure, type 2 diabetes on insulin, bilateral below-knee amputation found unresponsive at home by his friend, he was slumped over to the couch with shallow respiration.  Recent multiple complicated hospitalization and recently went home from a skilled nursing facility.  Takes opiates.  Recently treated for sacral osteomyelitis, has indwelling Foley catheter.  Has wound VAC on the back.  Initially EMS found him with oxygen saturation 50 and respiration of poor, bag mask ventilation with improvement of oxygen.  Did not receive Narcan.  Admitted with possible UTI. ?  ?  ?Assessment & Plan: ?  ?Acute respiratory failure with hypoxemia suspected secondary to aspiration pneumonia: Patient denied any symptoms of aspiration before the event. ?Initially presented with oxygen saturation 50 on room air, chest x-ray noted elevated right hemidiaphragm with minimal right basilar segmental atelectasis.  CT scan with suspected bibasilar aspiration.  No pulmonary symptoms today. ?Blood cultures negative.  Treated with vancomycin and cefepime.  Will treat with cephalosporin for 10 more days for UTI.  See below. ?  ?Episode of unresponsiveness: Transient.  Unknown.  Possible vasovagal episode.  No evidence of seizure disorder.  No  evidence of drug overdose.  No recurrent events.  No orthostatic changes. ? ?Complicated UTI: UTI secondary to Foley catheter. ?Blood cultures negative. ?Urine cultures not sent on admission. ?Foley catheter was exchanged 4/12, urine culture was sent.  No growth so far.  Received 3 days of vancomycin and cefepime.  With no positive cultures, will treat as complicated UTI due to indwelling Foley catheter with Keflex for 10 more days. ?  ?Renal mass: Noted on CT scan a complex mass.  Patient has mixed solid and cystic lesion of the posterior midportion of the left kidney measuring up to 3 cm.  ?Followed by Dr Louis Meckel and is under surveillance. They will follow up.   ?  ?Paroxysmal a flutter: Currently in sinus rhythm.  Continue on amiodarone and Eliquis. ?Ejection fraction 35%, diabetes and hypertension.  CHA2DS2-VASc 2 score more than 4.  He will benefit with continuing Eliquis. ? ?Type 2 diabetes with hyperglycemia, long-term use of insulin: ?Last A1c 8.6.  Only using sliding scale insulin at home. ?Patient agreed to use Novolin 70/30, 15 units twice daily along with sliding scale insulin.  Close monitoring at home and follow-up. ? ?Essential hypertension: Blood pressure stable.  Resume home medications. ?  ?Sacral decubitus ulcer with recently treated osteomyelitis: New CT scan with no evidence of bony erosions. Continue wound VAC.  Resume wound VAC on discharge at home. ?  ?   ?Patient is stable for discharge.  No positive blood cultures or urine cultures.  Presumptive treatment with Keflex. ?Patient is aware about complex cyst on the left kidney and has already seen urologist Dr. Louis Meckel.  He will keep up the follow-up. ? ? ?Discharge Diagnoses:  ?Principal Problem: ?  Decubitus ulcer of sacral area ?Active Problems: ?  Episode  of unresponsiveness ?  Acute respiratory failure with hypoxia (HCC) secondary to suspected aspiration pneumonia ?  Complicated UTI (urinary tract infection) ?  Leukocytosis ?  Lactic  acidosis ?  Essential hypertension ?  Type 2 diabetes mellitus with hyperglycemia, with long-term current use of insulin ([place]) ?  Chest pain ?  Paroxysmal atrial flutter ([place]) ?  Chronic pain ?  Aspiration pneumonia ([place]) ?  Renal mass ? ? ? ?Discharge Instructions ? ?Discharge Instructions   ? ? Call MD for:  difficulty breathing, headache or visual disturbances   Complete by: As directed ?  ? Call MD for:  temperature >100.4   Complete by: As directed ?  ? Diet - low sodium heart healthy   Complete by: As directed ?  ? Diet Carb Modified   Complete by: As directed ?  ? Discharge instructions   Complete by: As directed ?  ? Use long acting insulin and continue to use 4 times a day sliding scale insulin as you doing.  ? Discharge wound care:   Complete by: As directed ?  ? Wound care  ? Increase activity slowly   Complete by: As directed ?  ? ?  ? ?Allergies as of 12/18/2021   ? ?   Reactions  ? Bactrim [sulfamethoxazole-trimethoprim]   ? Ceprotin [protein C Concentrate (human)]   ? Ciprofloxacin Other (See Comments)  ? Kidney function  ? Levaquin [levofloxacin]   ? ?  ? ?  ?Medication List  ?  ? ?STOP taking these medications   ? ?Lantus SoloStar 100 UNIT/ML Solostar Pen ?Generic drug: insulin glargine ?  ?Santyl 250 UNIT/GM ointment ?Generic drug: collagenase ?  ? ?  ? ?TAKE these medications   ? ?amiodarone 200 MG tablet ?Commonly known as: PACERONE ?Take 1 tablet (200 mg total) by mouth daily. ?  ?apixaban 5 MG Tabs tablet ?Commonly known as: ELIQUIS ?Take 1 tablet (5 mg total) by mouth 2 (two) times daily. ?  ?blood glucose meter kit and supplies Kit ?Dispense based on patient and insurance preference. Use up to four times daily as directed. ?  ?cephALEXin 500 MG capsule ?Commonly known as: KEFLEX ?Take 1 capsule (500 mg total) by mouth 3 (three) times daily for 10 days. ?  ?finasteride 5 MG tablet ?Commonly known as: PROSCAR ?Take 1 tablet (5 mg total) by mouth daily. ?  ?furosemide 40 MG tablet ?Commonly known  as: LASIX ?Take 40 mg by mouth daily. ?  ?gabapentin 400 MG capsule ?Commonly known as: NEURONTIN ?Take 1 capsule (400 mg total) by mouth 3 (three) times daily. ?  ?Insulin Pen Needle 32G X 4 MM Misc ?Use as directed ?  ?insulin regular 100 units/mL injection ?Commonly known as: NOVOLIN R ?Inject 0-20 Units into the skin 4 (four) times daily as needed for high blood sugar. ?  ?methocarbamol 500 MG tablet ?Commonly known as: ROBAXIN ?Take 1,000 mg by mouth 3 (three) times daily. ?  ?metoprolol succinate 25 MG 24 hr tablet ?Commonly known as: TOPROL-XL ?Take 1 tablet (25 mg total) by mouth daily. ?  ?NovoLIN 70/30 ReliOn (70-30) 100 UNIT/ML injection ?Generic drug: insulin NPH-regular Human ?Inject 15 Units into the skin 2 (two) times daily with a meal. ?  ?protein supplement shake Liqd ?Commonly known as: PREMIER PROTEIN ?Take 2 oz by mouth 3 (three) times daily with meals. ?  ?tamsulosin 0.4 MG Caps capsule ?Commonly known as: FLOMAX ?Take 1 capsule (0.4 mg total) by mouth daily. ?  ?testosterone cypionate 200  MG/ML injection ?Commonly known as: DEPOTESTOSTERONE CYPIONATE ?Inject 1 mL (200 mg total) into the muscle every 14 (fourteen) days. ?  ?traZODone 150 MG tablet ?Commonly known as: DESYREL ?TAKE 1 TABLET(150 MG) BY MOUTH AT BEDTIME AS NEEDED FOR SLEEP ?What changed: See the new instructions. ?  ?Xtampza ER 27 MG C12a ?Generic drug: oxyCODONE ER ?Take 1 tablet by mouth 2 (two) times daily as needed for up to 7 days. ?  ? ?  ? ?  ?  ? ? ?  ?Durable Medical Equipment  ?(From admission, onward)  ?  ? ? ?  ? ?  Start     Ordered  ? 12/17/21 1305  For home use only DME Hospital bed  Once       ?Question Answer Comment  ?Length of Need 12 Months   ?Patient has (list medical condition): hx of hypertension, chronic systolic congestive heart failure, type 2 diabetes on insulin, bilateral below-knee amputation , sacral wound   ?The above medical condition requires: Patient requires the ability to reposition frequently    ?Head must be elevated greater than: 30 degrees   ?Bed type Semi-electric   ?Support Surface: Low Air loss Mattress   ?  ? 12/17/21 1313  ? ?  ?  ? ?  ? ? ?  ?Discharge Care Instructions  ?(From admission,

## 2021-12-18 NOTE — Progress Notes (Signed)
Physical Therapy Treatment ?Patient Details ?Name: [name] ?MRN: [id number] ?DOB: [birth year] ?Today's Date: 12/18/2021 ? ? ?History of Present Illness [name] is a 56 y.o. male with medical history significant of hypertension, chronic systolic CHF, DM type II, and bilateral BKA admitted after being found unresponsive.  recent hosptialization 07/21/21-09/09/21 and 09/12/21-09/19/21 due to L foot infection s/p BKA and bacteremia with discitis/osteomyelitis.  Still with indwelling foley catheter and found to have UTI. ? ?  ?PT Comments  ? ? Pt progressing towards physical therapy goals. Was able to perform transfers to/from wheelchair with slide board and supervision for optimal safety. Talked through ADL's and transfers at home and it sounds like the pt has a good routine in place with care and mobility. Continue to recommend the hospital bed as pt is currently sleeping on his couch at home. Will continue to follow and progress as able per POC.  ?  ?Recommendations for follow up therapy are one component of a multi-disciplinary discharge planning process, led by the attending physician.  Recommendations may be updated based on patient status, additional functional criteria and insurance authorization. ? ?Follow Up Recommendations ? Home health PT ?  ?  ?Assistance Recommended at Discharge Intermittent Supervision/Assistance  ?Patient can return home with the following A little help with walking and/or transfers ?  ?Equipment Recommendations ? Hospital bed  ?  ?Recommendations for Other Services   ? ? ?  ?Precautions / Restrictions Precautions ?Precautions: Fall ?Precaution Comments: bilat BKA, wound vac on sacrum ?Restrictions ?Weight Bearing Restrictions: No  ?  ? ?Mobility ? Bed Mobility ?Overal bed mobility: Modified Independent ?  ?  ?  ?  ?  ?  ?General bed mobility comments: No assist. Pt was able to transition to long sitting and around to EOB without difficulty. Min use of rails initially, then no use of  rails to return to supine. ?  ? ?Transfers ?Overall transfer level: Needs assistance ?Equipment used: Sliding board ?Transfers: Bed to chair/wheelchair/BSC ?  ?  ?  ?  ?  ? Lateral/Scoot Transfers: Supervision, With slide board ?General transfer comment: Light supervision for safety. No assist required. Increased time and effort required for scoot back to bed as slideboard was an incline. ?  ? ?Ambulation/Gait ?  ?  ?  ?  ?  ?  ?  ?General Gait Details: Pt nonambulatory at baseline. ? ? ?Stairs ?  ?  ?  ?  ?  ? ? ?Wheelchair Mobility ?  ? ?Modified Rankin (Stroke Patients Only) ?  ? ? ?  ?Balance Overall balance assessment: Needs assistance ?  ?Sitting balance-Leahy Scale: Fair ?Sitting balance - Comments: able to reach outside BOS without LOB ?  ?  ?  ?  ?  ?  ?  ?  ?  ?  ?  ?  ?  ?  ?  ?  ? ?  ?Cognition Arousal/Alertness: Awake/alert ?Behavior During Therapy: [facility] for tasks assessed/performed ?Overall Cognitive Status: Within Functional Limits for tasks assessed ?  ?  ?  ?  ?  ?  ?  ?  ?  ?  ?  ?  ?  ?  ?  ?  ?  ?  ?  ? ?  ?Exercises   ? ?  ?General Comments   ?  ?  ? ?Pertinent Vitals/Pain Pain Assessment ?Pain Assessment: Faces ?Faces Pain Scale: Hurts a little bit ?Pain Location: sacrum ?Pain Descriptors / Indicators: Discomfort ?Pain Intervention(s): Limited activity within patient's tolerance,  Monitored during session, Repositioned  ? ? ?Home Living   ?  ?  ?  ?  ?  ?  ?  ?  ?  ?   ?  ?Prior Function    ?  ?  ?   ? ?PT Goals (current goals can now be found in the care plan section) Acute Rehab PT Goals ?Patient Stated Goal: to return to independent ?PT Goal Formulation: With patient ?Time For Goal Achievement: 12/24/21 ?Potential to Achieve Goals: Good ?Progress towards PT goals: Progressing toward goals ? ?  ?Frequency ? ? ? Min 3X/week ? ? ? ?  ?PT Plan Current plan remains appropriate  ? ? ?Co-evaluation   ?  ?  ?  ?  ? ?  ?AM-PAC PT "6 Clicks" Mobility   ?Outcome Measure ? Help needed turning from your  back to your side while in a flat bed without using bedrails?: None ?Help needed moving from lying on your back to sitting on the side of a flat bed without using bedrails?: None ?Help needed moving to and from a bed to a chair (including a wheelchair)?: None ?Help needed standing up from a chair using your arms (e.g., wheelchair or bedside chair)?: Total ?Help needed to walk in hospital room?: Total ?Help needed climbing 3-5 steps with a railing? : Total ?6 Click Score: 15 ? ?  ?End of Session Equipment Utilized During Treatment: Other (comment) (Slideboard) ?Activity Tolerance: Patient tolerated treatment well ?Patient left: in bed;with call bell/phone within reach;with bed alarm set ?Nurse Communication: Mobility status ?PT Visit Diagnosis: Other abnormalities of gait and mobility (R26.89);Muscle weakness (generalized) (M62.81) ?  ? ? ?Time: 6440-3474 ?PT Time Calculation (min) (ACUTE ONLY): 30 min ? ?Charges:  $Therapeutic Activity: 23-37 mins          ?          ? ?[name], PT, DPT ?Acute Rehabilitation Services ?Secure Chat Preferred ?Office: [phone]  ? ? ?[name] ?12/18/2021, 11:44 AM ? ?

## 2021-12-18 NOTE — Progress Notes (Signed)
Provided discharge education/instructions, all questions and concerns addressed. Wound vac dressing replaced with wet to dry dressing per order. Pt not in acute distress, waiting on bed to be delivered to Pt's home prior to dc. ? ?

## 2021-12-20 LAB — CULTURE, BLOOD (ROUTINE X 2)
Culture: NO GROWTH
Culture: NO GROWTH
Special Requests: ADEQUATE
Special Requests: ADEQUATE

## 2021-12-25 ENCOUNTER — Telehealth: Payer: Self-pay

## 2021-12-26 ENCOUNTER — Encounter: Payer: Self-pay | Admitting: Family

## 2021-12-26 ENCOUNTER — Telehealth: Payer: Self-pay

## 2021-12-26 ENCOUNTER — Ambulatory Visit (INDEPENDENT_AMBULATORY_CARE_PROVIDER_SITE_OTHER): Payer: Medicare HMO | Admitting: Family

## 2021-12-26 VITALS — BP 155/85 | HR 81 | Temp 99.0°F

## 2021-12-26 DIAGNOSIS — G894 Chronic pain syndrome: Secondary | ICD-10-CM | POA: Diagnosis not present

## 2021-12-26 DIAGNOSIS — M533 Sacrococcygeal disorders, not elsewhere classified: Secondary | ICD-10-CM

## 2021-12-26 DIAGNOSIS — L89154 Pressure ulcer of sacral region, stage 4: Secondary | ICD-10-CM

## 2021-12-26 MED ORDER — OXYCODONE-ACETAMINOPHEN 5-325 MG PO TABS: 1.0000 | ORAL_TABLET | Freq: Every day | ORAL | 0 refills | Status: AC

## 2021-12-26 NOTE — Telephone Encounter (Signed)
Is requesting call back in regard to wound vac order.    Will be faxing order over today.

## 2021-12-26 NOTE — Patient Instructions (Addendum)
Welcome to [facility]! It was a pleasure meeting you today. ? ?As discussed, I will reach out to your home health nurse, Hinton Dyer, to see if I can get a [employer] wound vac ordered for you. However, you will need to get continued orders from the wound center clinic you are going to on Wednesday. ?Call your cardiologist, Dr. Sallyanne Kuster for all of your heart, blood pressure medication refills, as you have newly diagnosed heart failure, with new medications. ?You also should be following up with your Endocrinologist, Dr. Renne Crigler.  ? ? ? ?PLEASE NOTE: ? ?If you had any LAB tests please let us know if you have not heard back within a few days. You may see your results on MyChart before we have a chance to review them but we will give you a call once they are reviewed by [place]. If we ordered any REFERRALS today, please let us know if you have not heard from their office within the next week.  ?Let us know through MyChart if you are needing REFILLS, or have your pharmacy send [place] the request. You can also use MyChart to communicate with me or any office staff. ? ?

## 2021-12-26 NOTE — Progress Notes (Signed)
? ?New Patient Office Visit ? ?Subjective:  ?[name], male    DOB: [birth year]  Age: 56 y.o. MRN: [id number] ? ?CC:  ?Chief Complaint  ?Patient presents with  ? Establish Care  ? Wound Check  ? Pain Management  ? ?HPI ?[name] presents for establishing care. ?Sacral wound:  1/27-3/24 [facility] [place]. Has had [facility] coming out to apply a wound vac, but his HHRN, Riccardo Dubin, recommends a [employer] wound vac that will work better. He has a wound center appointment on Wednesday. He is doing a wet to dry dressing daily.  ?Pain He reports chronic wound & phantom pain. There was an injury that may have caused the pain. The pain started a few years ago and is gradually worsening, because pt has been unable to get his medications. The pain does radiate down his leg. The pain is described as burning, sharp, stabbing, throbbing, and tingling, is severe in intensity, occurring constantly. Symptoms are worse in the: all the time.  ?Aggravating factors: sitting He has tried prescription pain relievers with moderate relief.  ? ?Past Medical History:  ?Diagnosis Date  ? Acquired complex renal cyst 09/25/2019  ? AKI (acute kidney injury) ([place]) 07/22/2021  ? Chest pain 07/22/2021  ? Chronic diastolic CHF (congestive heart failure) ([place])   ? Complication of anesthesia   ? Decubitus ulcer of sacral area 12/15/2021  ? Depression   ? Diabetes mellitus without complication ([place])   ? Healthcare maintenance 09/25/2019  ? Hematuria 09/25/2019  ? Hypertension   ? Lactic acidosis 12/15/2021  ? Leukocytosis 09/13/2021  ? Sacral pressure injury of skin 07/27/2021  ? Septic shock ([place]) 07/22/2021  ? Severe sepsis with acute organ dysfunction ([place]) 07/22/2021  ? Shortness of breath 05/16/2021  ? Stage 3a chronic kidney disease ([place]) 09/25/2019  ? Systolic dysfunction   ? ? ?Past Surgical History:  ?Procedure Laterality Date  ? AMPUTATION Left 07/23/2021  ? Procedure: LEFT BELOW KNEE AMPUTATION;  Surgeon:  [name], MD;  Location: [place];  Service: Orthopedics;  Laterality: Left;  ? BELOW KNEE LEG AMPUTATION Right   ? BUBBLE STUDY  07/29/2021  ? Procedure: BUBBLE STUDY;  Surgeon: Sueanne Margarita, MD;  Location: [place];  Service: Cardiovascular;;  ? CARDIOVERSION N/A 07/29/2021  ? Procedure: CARDIOVERSION;  Surgeon: Sueanne Margarita, MD;  Location: [facility];  Service: Cardiovascular;  Laterality: N/A;  ? RADIOLOGY WITH ANESTHESIA N/A 08/05/2021  ? Procedure: MRI LUMBAR WITH AND WITHOUT; THORASIC SPINE WITH AND WITHOUT WITH ANESTHESIA;  Surgeon: Radiologist, Medication, MD;  Location: [facility];  Service: Radiology;  Laterality: N/A;  ? RADIOLOGY WITH ANESTHESIA N/A 08/07/2021  ? Procedure: MRI WITH LUMBER WITH AND WITHOUT CONTRAST,THORACIC WITH AND WITHOUT CONTRAST;  Surgeon: Radiologist, Medication, MD;  Location: [facility];  Service: Radiology;  Laterality: N/A;  ? RADIOLOGY WITH ANESTHESIA N/A 09/13/2021  ? Procedure: MRI WITH ANESTHESIA;  Surgeon: Luanne Bras, MD;  Location: [facility];  Service: Radiology;  Laterality: N/A;  ? TEE WITHOUT CARDIOVERSION N/A 07/29/2021  ? Procedure: TRANSESOPHAGEAL ECHOCARDIOGRAM (TEE);  Surgeon: Sueanne Margarita, MD;  Location: [place] [facility];  Service: Cardiovascular;  Laterality: N/A;  ? ? ?Family History  ?Problem Relation Age of Onset  ? Anxiety disorder Mother   ? Cancer Father   ? Coronary artery disease Father   ? ? ?Social History  ? ?Socioeconomic History  ? Marital status: Divorced  ?  Spouse name: Not on file  ?  Number of children: Not on file  ? Years of education: Not on file  ? Highest education level: Not on file  ?Occupational History  ? Not on file  ?Tobacco Use  ? Smoking status: Former  ?  Types: Cigarettes  ? Smokeless tobacco: Never  ?Vaping Use  ? Vaping Use: Never used  ?Substance and Sexual Activity  ? Alcohol use: Not Currently  ? Drug use: Never  ? Sexual activity: Not Currently  ?Other Topics Concern  ? Not on file  ?Social History Narrative  ? Not on  file  ? ?Social Determinants of Health  ? ?Financial Resource Strain: Not on file  ?Food Insecurity: Not on file  ?Transportation Needs: Not on file  ?Physical Activity: Not on file  ?Stress: Not on file  ?Social Connections: Not on file  ?Intimate Partner Violence: Not on file  ? ? ?Objective:  ? ?Today's Vitals: BP (!) 155/85 (BP Location: Left Arm, Patient Position: Sitting, Cuff Size: Large)   Pulse 81   Temp 99 ?F (37.2 ?C) (Temporal)   SpO2 96%  ? ?Physical Exam ?Vitals and nursing note reviewed.  ?Constitutional:   ?   General: He is not in acute distress. ?   Appearance: Normal appearance.  ?HENT:  ?   Head: Normocephalic.  ?Cardiovascular:  ?   Rate and Rhythm: Normal rate and regular rhythm.  ?Pulmonary:  ?   Effort: Pulmonary effort is normal.  ?   Breath sounds: Normal breath sounds.  ?Musculoskeletal:     ?   General: Normal range of motion.  ?   Cervical back: Normal range of motion.  ?Skin: ?   General: Skin is warm and dry.  ?   Findings: Wound (sacrum, covered with bandage) present.  ?Neurological:  ?   Mental Status: He is alert and oriented to person, place, and time.  ?Psychiatric:     ?   Mood and Affect: Mood normal.  ? ? ?Assessment & Plan:  ? ?Problem List Items Addressed This Visit   ? ?  ? Other  ? Sacral pain  ?  due to current stage IV ulcer, present since hospitalization 07/2021-09/2021 ? ?  ?  ? Relevant Medications  ? oxyCODONE-acetaminophen (PERCOCET/ROXICET) 5-325 MG tablet  ? Chronic pain  ?  c/o Low back and sacral pain r/t current decubitus. Requesting regular OXY, states he feels nothing with X[place]. Sees pain mgt for spinal injections, but states he can't get treated now b/c of his ulcer. Advised he will need new pain mgt if wound center does not manage. Advised pt I do not do pain mgt and can only give him lowest dose for 7d today. ? ?  ?  ? Relevant Medications  ? oxyCODONE-acetaminophen (PERCOCET/ROXICET) 5-325 MG tablet  ? Stage IV pressure ulcer of sacral region [facility]) -  Primary  ?  pt states ulcer started while hospitalized from 07/2021-09/2021. he has [place] from [facility] changing dressings with a negative pressure vac, his HHRN is requesting a KCI vac instead because first brand is not working well. He is requesting I send an order, but he has a first appointment with the wound center on Wednesday, would prefer they initiate since they will be managing his care. pt showed a picture taken this morning of wound, approx 3cm in diameter, (with 2cm depth and 3cm tunneling per review of hosp wound note) treating now with saline wet to dry qd until new vac starts. ? ?  ?  ? ?Outpatient  Encounter Medications as of 12/26/2021  ?Medication Sig  ? amiodarone (PACERONE) 200 MG tablet Take 1 tablet (200 mg total) by mouth daily.  ? apixaban (ELIQUIS) 5 MG TABS tablet Take 1 tablet (5 mg total) by mouth 2 (two) times daily.  ? blood glucose meter kit and supplies KIT Dispense based on patient and insurance preference. Use up to four times daily as directed.  ? cephALEXin (KEFLEX) 500 MG capsule Take 1 capsule (500 mg total) by mouth 3 (three) times daily for 10 days.  ? finasteride (PROSCAR) 5 MG tablet Take 1 tablet (5 mg total) by mouth daily.  ? furosemide (LASIX) 40 MG tablet Take 40 mg by mouth daily.  ? gabapentin (NEURONTIN) 400 MG capsule Take 1 capsule (400 mg total) by mouth 3 (three) times daily.  ? insulin NPH-regular Human (NOVOLIN 70/30 RELION) (70-30) 100 UNIT/ML injection Inject 15 Units into the skin 2 (two) times daily with a meal.  ? Insulin Pen Needle 32G X 4 MM MISC Use as directed  ? insulin regular (NOVOLIN R) 100 units/mL injection Inject 0-20 Units into the skin 4 (four) times daily as needed for high blood sugar.  ? methocarbamol (ROBAXIN) 500 MG tablet Take 1,000 mg by mouth 3 (three) times daily.  ? metoprolol succinate (TOPROL-XL) 25 MG 24 hr tablet Take 1 tablet (25 mg total) by mouth daily.  ? oxyCODONE-acetaminophen (PERCOCET/ROXICET) 5-325 MG tablet Take 1 tablet by  mouth daily after breakfast for 7 days.  ? protein supplement shake (PREMIER PROTEIN) LIQD Take 2 oz by mouth 3 (three) times daily with meals.  ? tamsulosin (FLOMAX) 0.4 MG CAPS capsule Take 1 capsule (0.4

## 2021-12-28 ENCOUNTER — Emergency Department (HOSPITAL_COMMUNITY): Payer: Medicare HMO

## 2021-12-28 ENCOUNTER — Inpatient Hospital Stay (HOSPITAL_COMMUNITY)
Admission: EM | Admit: 2021-12-28 | Discharge: 2021-12-31 | DRG: 637 | Disposition: A | Discharge status: Home Health | Payer: Medicare HMO | Attending: Internal Medicine | Admitting: Internal Medicine

## 2021-12-28 ENCOUNTER — Encounter (HOSPITAL_COMMUNITY): Payer: Self-pay | Admitting: Emergency Medicine

## 2021-12-28 ENCOUNTER — Encounter: Payer: Self-pay | Admitting: Family

## 2021-12-28 DIAGNOSIS — E1165 Type 2 diabetes mellitus with hyperglycemia: Secondary | ICD-10-CM | POA: Diagnosis not present

## 2021-12-28 DIAGNOSIS — L98494 Non-pressure chronic ulcer of skin of other sites with necrosis of bone: Secondary | ICD-10-CM | POA: Diagnosis not present

## 2021-12-28 DIAGNOSIS — Z87891 Personal history of nicotine dependence: Secondary | ICD-10-CM

## 2021-12-28 DIAGNOSIS — Z79899 Other long term (current) drug therapy: Secondary | ICD-10-CM | POA: Diagnosis not present

## 2021-12-28 DIAGNOSIS — Z881 Allergy status to other antibiotic agents status: Secondary | ICD-10-CM

## 2021-12-28 DIAGNOSIS — G8929 Other chronic pain: Secondary | ICD-10-CM | POA: Diagnosis present

## 2021-12-28 DIAGNOSIS — N390 Urinary tract infection, site not specified: Secondary | ICD-10-CM | POA: Diagnosis present

## 2021-12-28 DIAGNOSIS — L98429 Non-pressure chronic ulcer of back with unspecified severity: Secondary | ICD-10-CM | POA: Diagnosis present

## 2021-12-28 DIAGNOSIS — R739 Hyperglycemia, unspecified: Secondary | ICD-10-CM

## 2021-12-28 DIAGNOSIS — L308 Other specified dermatitis: Secondary | ICD-10-CM | POA: Diagnosis present

## 2021-12-28 DIAGNOSIS — N2889 Other specified disorders of kidney and ureter: Secondary | ICD-10-CM | POA: Diagnosis present

## 2021-12-28 DIAGNOSIS — I4892 Unspecified atrial flutter: Secondary | ICD-10-CM | POA: Diagnosis not present

## 2021-12-28 DIAGNOSIS — F32A Depression, unspecified: Secondary | ICD-10-CM | POA: Diagnosis present

## 2021-12-28 DIAGNOSIS — Z7901 Long term (current) use of anticoagulants: Secondary | ICD-10-CM

## 2021-12-28 DIAGNOSIS — K4090 Unilateral inguinal hernia, without obstruction or gangrene, not specified as recurrent: Secondary | ICD-10-CM | POA: Diagnosis not present

## 2021-12-28 DIAGNOSIS — Z89511 Acquired absence of right leg below knee: Secondary | ICD-10-CM | POA: Diagnosis not present

## 2021-12-28 DIAGNOSIS — J9811 Atelectasis: Secondary | ICD-10-CM | POA: Diagnosis not present

## 2021-12-28 DIAGNOSIS — L89159 Pressure ulcer of sacral region, unspecified stage: Secondary | ICD-10-CM | POA: Diagnosis not present

## 2021-12-28 DIAGNOSIS — I480 Paroxysmal atrial fibrillation: Secondary | ICD-10-CM | POA: Diagnosis present

## 2021-12-28 DIAGNOSIS — Z89512 Acquired absence of left leg below knee: Secondary | ICD-10-CM

## 2021-12-28 DIAGNOSIS — Z79891 Long term (current) use of opiate analgesic: Secondary | ICD-10-CM

## 2021-12-28 DIAGNOSIS — Z794 Long term (current) use of insulin: Secondary | ICD-10-CM

## 2021-12-28 DIAGNOSIS — R531 Weakness: Secondary | ICD-10-CM | POA: Diagnosis not present

## 2021-12-28 DIAGNOSIS — I5020 Unspecified systolic (congestive) heart failure: Secondary | ICD-10-CM | POA: Diagnosis not present

## 2021-12-28 DIAGNOSIS — L89154 Pressure ulcer of sacral region, stage 4: Secondary | ICD-10-CM | POA: Diagnosis not present

## 2021-12-28 DIAGNOSIS — Z79890 Hormone replacement therapy (postmenopausal): Secondary | ICD-10-CM

## 2021-12-28 DIAGNOSIS — D631 Anemia in chronic kidney disease: Secondary | ICD-10-CM | POA: Diagnosis not present

## 2021-12-28 DIAGNOSIS — L089 Local infection of the skin and subcutaneous tissue, unspecified: Secondary | ICD-10-CM | POA: Diagnosis not present

## 2021-12-28 DIAGNOSIS — I10 Essential (primary) hypertension: Secondary | ICD-10-CM | POA: Diagnosis not present

## 2021-12-28 DIAGNOSIS — E1122 Type 2 diabetes mellitus with diabetic chronic kidney disease: Secondary | ICD-10-CM | POA: Diagnosis not present

## 2021-12-28 DIAGNOSIS — E1169 Type 2 diabetes mellitus with other specified complication: Secondary | ICD-10-CM | POA: Diagnosis not present

## 2021-12-28 DIAGNOSIS — E1142 Type 2 diabetes mellitus with diabetic polyneuropathy: Secondary | ICD-10-CM | POA: Diagnosis not present

## 2021-12-28 DIAGNOSIS — I5042 Chronic combined systolic (congestive) and diastolic (congestive) heart failure: Secondary | ICD-10-CM | POA: Diagnosis present

## 2021-12-28 DIAGNOSIS — Z883 Allergy status to other anti-infective agents status: Secondary | ICD-10-CM

## 2021-12-28 DIAGNOSIS — E1042 Type 1 diabetes mellitus with diabetic polyneuropathy: Secondary | ICD-10-CM

## 2021-12-28 DIAGNOSIS — M47817 Spondylosis without myelopathy or radiculopathy, lumbosacral region: Secondary | ICD-10-CM | POA: Diagnosis not present

## 2021-12-28 DIAGNOSIS — I130 Hypertensive heart and chronic kidney disease with heart failure and stage 1 through stage 4 chronic kidney disease, or unspecified chronic kidney disease: Secondary | ICD-10-CM | POA: Diagnosis not present

## 2021-12-28 DIAGNOSIS — K429 Umbilical hernia without obstruction or gangrene: Secondary | ICD-10-CM | POA: Diagnosis not present

## 2021-12-28 DIAGNOSIS — M4628 Osteomyelitis of vertebra, sacral and sacrococcygeal region: Secondary | ICD-10-CM | POA: Diagnosis not present

## 2021-12-28 DIAGNOSIS — N1831 Chronic kidney disease, stage 3a: Secondary | ICD-10-CM | POA: Diagnosis not present

## 2021-12-28 DIAGNOSIS — I5022 Chronic systolic (congestive) heart failure: Secondary | ICD-10-CM | POA: Diagnosis present

## 2021-12-28 LAB — COMPREHENSIVE METABOLIC PANEL
ALT: 22 U/L (ref 0–44)
AST: 22 U/L (ref 15–41)
Albumin: 3.2 g/dL — ABNORMAL LOW (ref 3.5–5.0)
Alkaline Phosphatase: 97 U/L (ref 38–126)
Anion gap: 9 (ref 5–15)
BUN: 12 mg/dL (ref 6–20)
CO2: 25 mmol/L (ref 22–32)
Calcium: 8.6 mg/dL — ABNORMAL LOW (ref 8.9–10.3)
Chloride: 97 mmol/L — ABNORMAL LOW (ref 98–111)
Creatinine, Ser: 0.92 mg/dL (ref 0.61–1.24)
GFR, Estimated: >60 mL/min (ref >60)
Glucose, Bld: 365 mg/dL — ABNORMAL HIGH (ref 70–99)
Potassium: 3.9 mmol/L (ref 3.5–5.1)
Sodium: 131 mmol/L — ABNORMAL LOW (ref 135–145)
Total Bilirubin: 0.4 mg/dL (ref 0.3–1.2)
Total Protein: 6.3 g/dL — ABNORMAL LOW (ref 6.5–8.1)

## 2021-12-28 LAB — CBC WITH DIFFERENTIAL/PLATELET
Abs Immature Granulocytes: 0.07 10*3/uL (ref 0.00–0.07)
Basophils Absolute: 0 10*3/uL (ref 0.0–0.1)
Basophils Relative: 0 %
Eosinophils Absolute: 0.3 10*3/uL (ref 0.0–0.5)
Eosinophils Relative: 3 %
HCT: 44 % (ref 39.0–52.0)
Hemoglobin: 15.1 g/dL (ref 13.0–17.0)
Immature Granulocytes: 1 %
Lymphocytes Relative: 8 %
Lymphs Abs: 0.7 10*3/uL (ref 0.7–4.0)
MCH: 30.4 pg (ref 26.0–34.0)
MCHC: 34.3 g/dL (ref 30.0–36.0)
MCV: 88.5 fL (ref 80.0–100.0)
Monocytes Absolute: 0.5 10*3/uL (ref 0.1–1.0)
Monocytes Relative: 6 %
Neutro Abs: 7.1 10*3/uL (ref 1.7–7.7)
Neutrophils Relative %: 82 %
Platelets: 255 10*3/uL (ref 150–400)
RBC: 4.97 MIL/uL (ref 4.22–5.81)
RDW: 14.8 % (ref 11.5–15.5)
WBC: 8.6 10*3/uL (ref 4.0–10.5)
nRBC: 0 % (ref 0.0–0.2)

## 2021-12-28 LAB — URINALYSIS, ROUTINE W REFLEX MICROSCOPIC
Bilirubin Urine: NEGATIVE
Glucose, UA: >=500 mg/dL — AB
Ketones, ur: NEGATIVE mg/dL
Nitrite: NEGATIVE
Protein, ur: 100 mg/dL — AB
RBC / HPF: >50 RBC/hpf — ABNORMAL HIGH (ref 0–5)
Specific Gravity, Urine: 1.013 (ref 1.005–1.030)
pH: 6 (ref 5.0–8.0)

## 2021-12-28 LAB — LACTIC ACID, PLASMA
Lactic Acid, Venous: 2.1 mmol/L (ref 0.5–1.9)
Lactic Acid, Venous: 1.7 mmol/L (ref 0.5–1.9)

## 2021-12-28 IMAGING — CT CT PELVIS W/ CM
2 of 4 series · 15 of 46 positions shown, 17 images · IV contrast (agent unspecified)
Comparison: CT abdomen pelvis with contrast recently [DATE]

CLINICAL DATA: Pelvic osteomyelitis suspected.  CP

EXAM:
CT PELVIS WITH CONTRAST
TECHNIQUE: Multidetector CT imaging of the pelvis was performed using the
standard protocol following the bolus administration of intravenous
contrast.

[Series 7: coronal st · coronal · 0.66mm/px · 3 of 153 slices shown]
[im 51/153  soft-tissue]
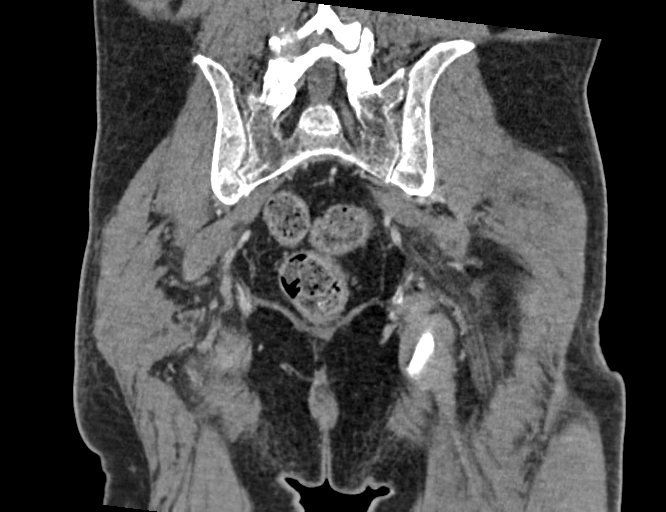
[im 68/153  soft-tissue]
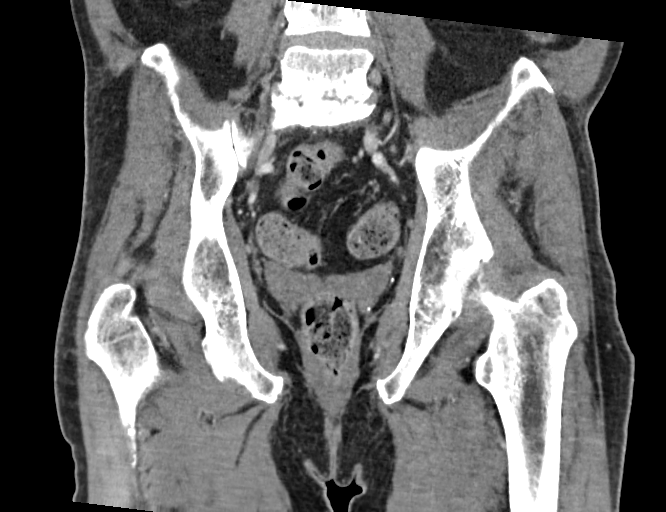
[im 85/153  soft-tissue]
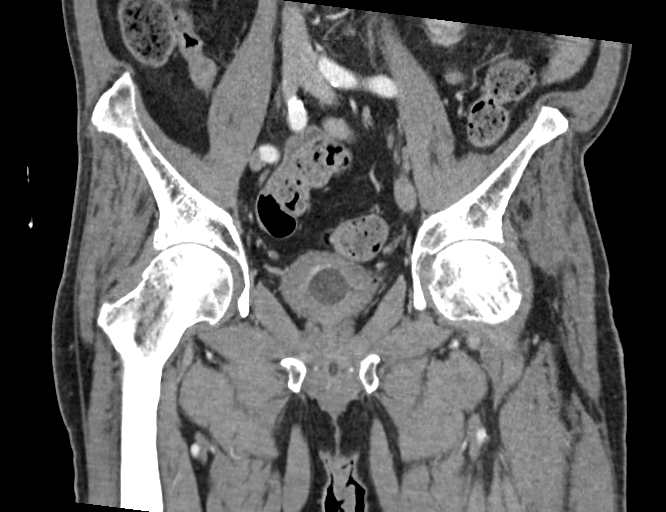

[Series 9: pelvis thin · axial · 0.91mm/px · z∈[-1024,-716]mm · 12 of 560 slices shown, 14 images]
[im 24/560  soft-tissue]
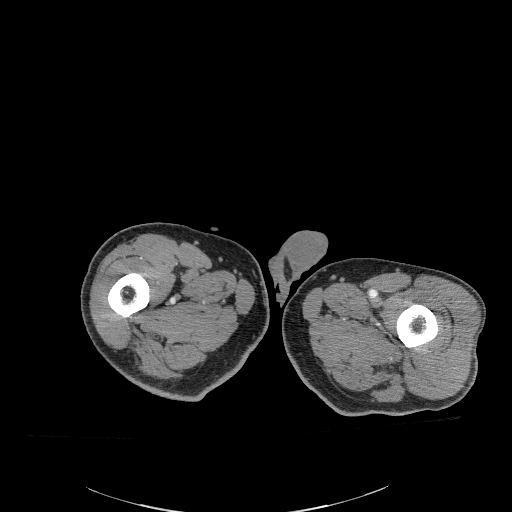
[im 24/560  bone]
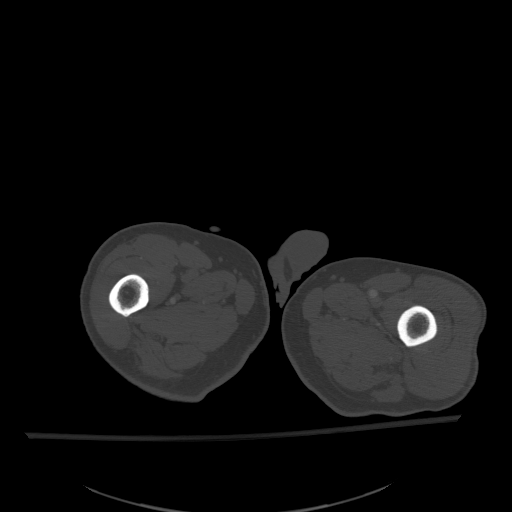
[im 70/560  soft-tissue]
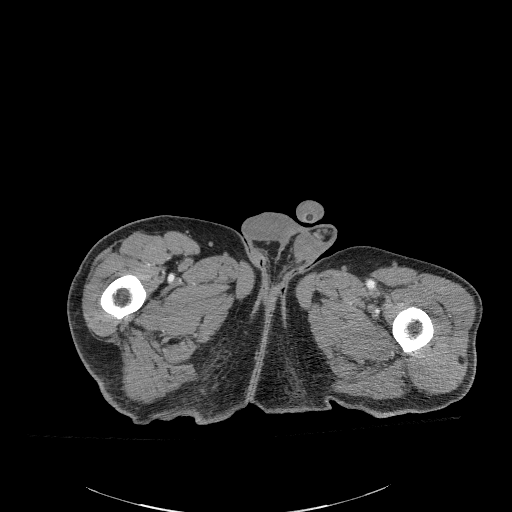
[im 117/560  soft-tissue]
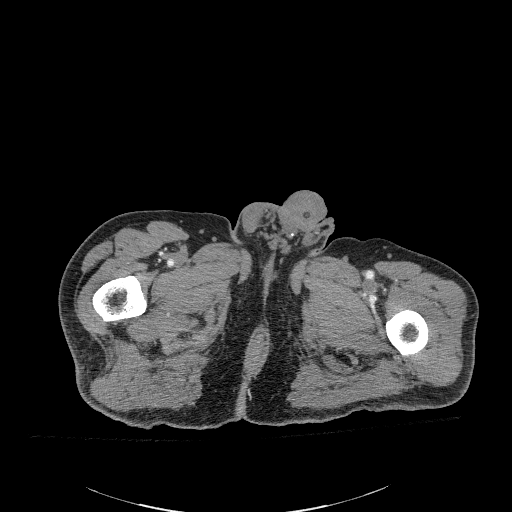
[im 164/560  soft-tissue]
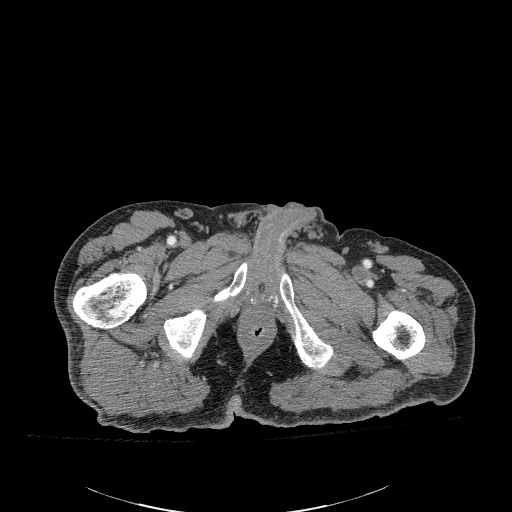
[im 210/560  soft-tissue]
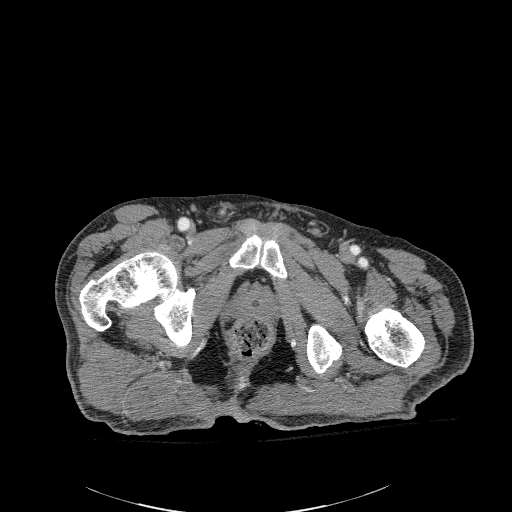
[im 257/560  soft-tissue]
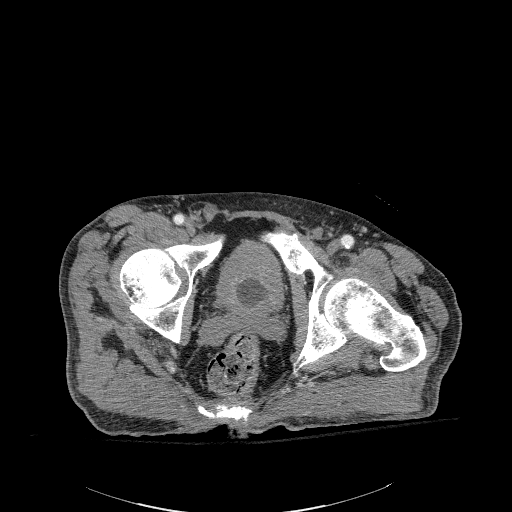
[im 303/560  soft-tissue]
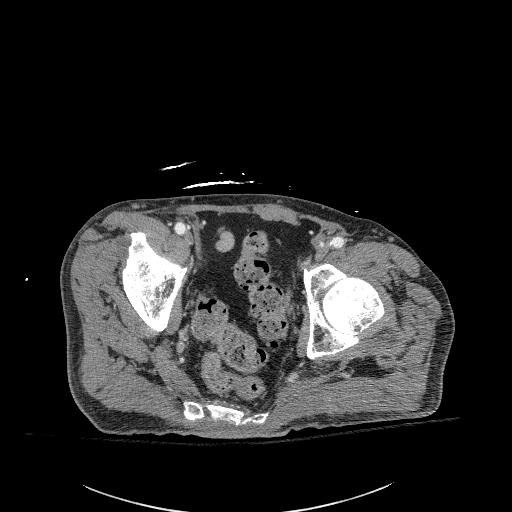
[im 350/560  soft-tissue]
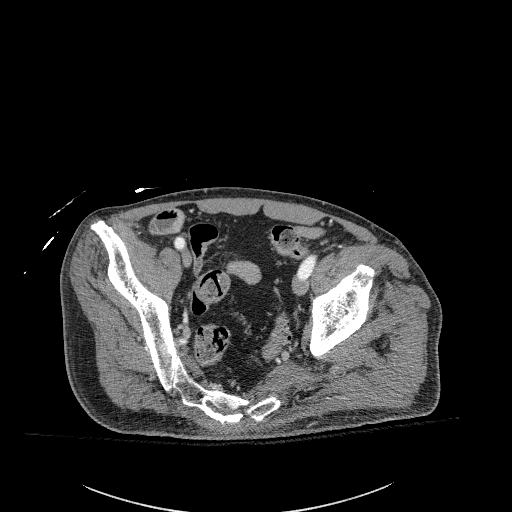
[im 396/560  soft-tissue]
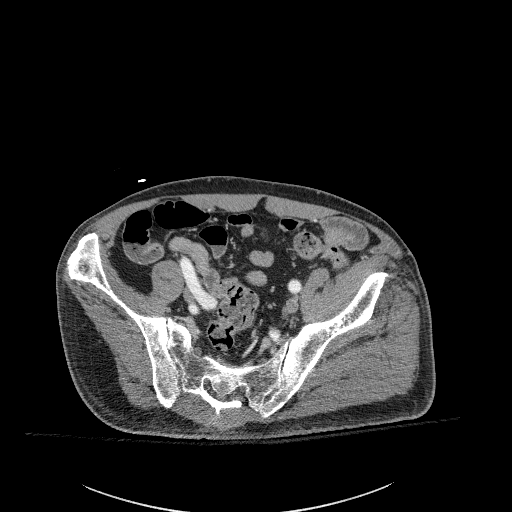
[im 396/560  bone]
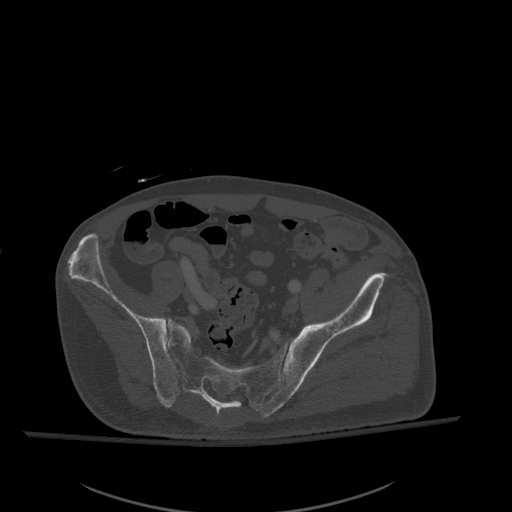
[im 443/560  soft-tissue]
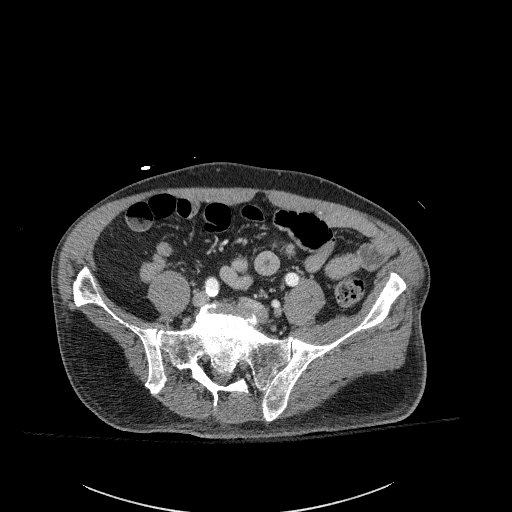
[im 490/560  soft-tissue]
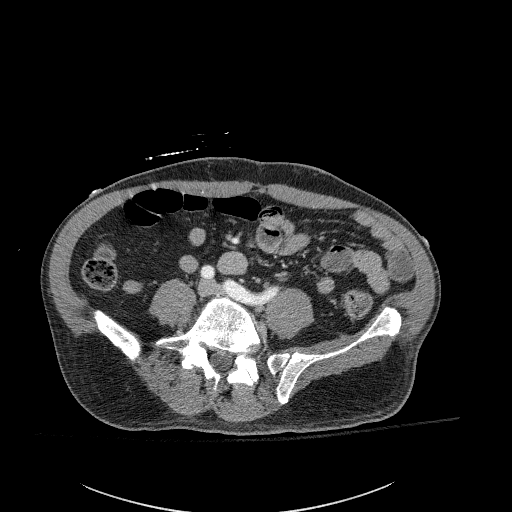
[im 536/560  soft-tissue]
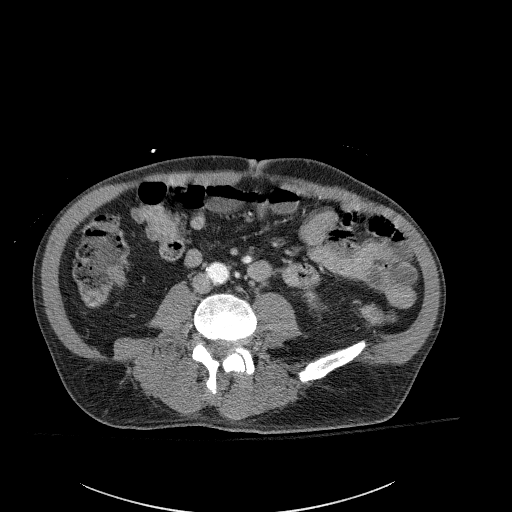

[15 of 46 positions shown; findings below may reference images not displayed]

RADIATION DOSE REDUCTION: This exam was performed according to the
departmental dose-optimization program which includes automated
exposure control, adjustment of the mA and/or kV according to
patient size and/or use of iterative reconstruction technique.

CONTRAST:  100mL OMNIPAQUE IOHEXOL 300 MG/ML  SOLN
FINDINGS: Urinary Tract: The distal ureters are clear. The bladder is again
noted catheterized and severely thickened, unchanged.

Bowel: No dilatation or wall thickening. Scattered uncomplicated
sigmoid diverticula. An appendix is not seen in this patient.

Vascular/Lymphatic: There is nonstenosing aortoiliac calcific
plaque. No lymphadenopathy is seen.

Reproductive: The prostate is not enlarged. Both testicles are in
the scrotal sac. There are multiple phleboliths along the spermatic
cord veins, both pelvic sidewalls.

Other: There are small umbilical and inguinal fat hernias. There is
trace posterior deep pelvic ascites which was seen previously. There
is no pelvic free air, hemorrhage or abscess.

Musculoskeletal: Most notably there is a midline sacral decubitus
ulcer overlying S5 and the first coccygeal segment, measuring 3.5 cm
craniocaudal, 2.7 cm coronal, and extending a depth of at least
cm.

As seen on [DATE] there is S5 sclerosis and a feathery
periosteal reaction with small pitted erosive changes in the outer
surface of the bone consistent with osteomyelitis and there is lytic
destructive change of the first and second coccygeal segments
consistent with additional osteomyelitis.

The remainder of the coccyx is intact. The bony pelvis and remainder
of the sacrum, both proximal femurs are intact with osteopenia.

There is partial joint space loss symmetrically at the hips with
small acetabular and femoral head osteophytes and slight spurring of
the SI joints.

There are few scattered bone islands in the pelvis. There are no
further destructive lesions.

There is advanced degenerative disc disease with reactive endplate
sclerosis at L5-S1, scattered endplate Schmorl's nodes. Marginal
vertebral body osteophytes and facet osteophytes cause severe
encroachment on both foramina.
IMPRESSION: 1. Sacral decubitus ulcer with underlying osteomyelitis of S5 and
the first 2 coccygeal segments. See above for full description.
2. L5-S1 with advanced degenerative disc disease, reactive endplate
sclerosis, spondylosis, and facet spurring with severe foraminal
encroachment. This is similar in appearance to the lumbar spine MRI
without and with contrast [DATE], with no abnormal endplate
enhancement on that study.
3. Osteopenia and degenerative change.
4. Aortic atherosclerosis.
5. Umbilical and inguinal fat hernias.

## 2021-12-28 MED ORDER — OXYCODONE-ACETAMINOPHEN 5-325 MG PO TABS
1.0000 | ORAL_TABLET | Freq: Once | ORAL | Status: AC
  Administered 2021-12-28: 1 via ORAL
  Filled 2021-12-28: qty 1

## 2021-12-28 MED ORDER — SODIUM CHLORIDE 0.9 % IV BOLUS: 500.0000 mL | Freq: Once | INTRAVENOUS | Status: DC

## 2021-12-28 MED ORDER — HYDROMORPHONE HCL 1 MG/ML IJ SOLN
0.5000 mg | Freq: Once | INTRAMUSCULAR | Status: AC
  Administered 2021-12-28: 0.5 mg via INTRAVENOUS
  Filled 2021-12-28: qty 1

## 2021-12-28 MED ORDER — IOHEXOL 300 MG/ML  SOLN
100.0000 mL | Freq: Once | INTRAMUSCULAR | Status: AC | PRN
  Administered 2021-12-28: 100 mL via INTRAVENOUS

## 2021-12-28 NOTE — ED Provider Notes (Signed)
?[facility] ?Provider Note ? ? ?CSN: [id number] ?Arrival date & time: 12/28/21  2031 ? ?  ? ?History ? ?Chief Complaint  ?Patient presents with  ? Weakness  ? ? ?[name] is a 56 y.o. male with a history of hypertension, diabetes mellitus, CKD, CHF, sacral decubitus ulcer, S/p bilateral BKA, prior discitis, and paroxysmal afib on eliquis who presents to the ED with complaints of generally not feeling well and concern for sacral wound infection. Patient reports he has had problems obtaining proper wound vac supplies for his sacral decubitus ulcer, has home health come to the house MWF, but has not had the supplies for wound vac. Recently saw PCP 2 days prior who placed orders for supplies but is still pending this. He reports since discharge from the hospital he had has progressively worsening pain and redness to his sacrum with redness and generally not feeling well. He has also had some drainage. Had some nausea with vomiting a couple days ago. Denies fever, chills, abdominal pain, dysuria, or cough. Currently on keflex from recent UTI.  ? ?HPI ? ?  ? ?Home Medications ?Prior to Admission medications   ?Medication Sig Start Date End Date Taking? Authorizing Provider  ?amiodarone (PACERONE) 200 MG tablet Take 1 tablet (200 mg total) by mouth daily. 12/05/21 02/03/22  [name], NP  ?apixaban (ELIQUIS) 5 MG TABS tablet Take 1 tablet (5 mg total) by mouth 2 (two) times daily. 12/18/21 01/17/22  Barb Merino, MD  ?blood glucose meter kit and supplies KIT Dispense based on patient and insurance preference. Use up to four times daily as directed. 12/05/21   [name], NP  ?cephALEXin (KEFLEX) 500 MG capsule Take 1 capsule (500 mg total) by mouth 3 (three) times daily for 10 days. 12/18/21 12/28/21  Barb Merino, MD  ?finasteride (PROSCAR) 5 MG tablet Take 1 tablet (5 mg total) by mouth daily. 12/05/21 03/05/22  [name], NP  ?furosemide (LASIX) 40 MG  tablet Take 40 mg by mouth daily. 12/09/21   [provider]  ?gabapentin (NEURONTIN) 400 MG capsule Take 1 capsule (400 mg total) by mouth 3 (three) times daily. 12/05/21 02/03/22  [name], NP  ?insulin NPH-regular Human (NOVOLIN 70/30 RELION) (70-30) 100 UNIT/ML injection Inject 15 Units into the skin 2 (two) times daily with a meal. 12/18/21   Barb Merino, MD  ?Insulin Pen Needle 32G X 4 MM MISC Use as directed 09/17/21   Modena Jansky, MD  ?insulin regular (NOVOLIN R) 100 units/mL injection Inject 0-20 Units into the skin 4 (four) times daily as needed for high blood sugar.    [provider]  ?methocarbamol (ROBAXIN) 500 MG tablet Take 1,000 mg by mouth 3 (three) times daily. 09/09/21   [provider]  ?metoprolol succinate (TOPROL-XL) 25 MG 24 hr tablet Take 1 tablet (25 mg total) by mouth daily. 12/05/21 02/03/22  [name], NP  ?oxyCODONE-acetaminophen (PERCOCET/ROXICET) 5-325 MG tablet Take 1 tablet by mouth daily after breakfast for 7 days. 12/26/21 01/02/22  [name], NP  ?protein supplement shake (PREMIER PROTEIN) LIQD Take 2 oz by mouth 3 (three) times daily with meals.    [provider]  ?tamsulosin (FLOMAX) 0.4 MG CAPS capsule Take 1 capsule (0.4 mg total) by mouth daily. 12/05/21 03/05/22  [name], NP  ?testosterone cypionate (DEPOTESTOSTERONE CYPIONATE) 200 MG/ML injection Inject 1 mL (200 mg total) into the muscle every 14 (fourteen) days. 12/05/21  [name], NP  ?traZODone (DESYREL) 150 MG tablet TAKE 1 TABLET(150 MG) BY MOUTH AT BEDTIME AS NEEDED FOR SLEEP 12/17/21   [name], NP  ?   ? ?Allergies    ?Bactrim [sulfamethoxazole-trimethoprim], Ceprotin [protein c concentrate (human)], Ciprofloxacin, and Levaquin [levofloxacin]   ? ?Review of Systems   ?Review of Systems  ?Constitutional:  Negative for chills and fever.  ?Respiratory:  Negative for cough.   ?Cardiovascular:  Negative for chest pain.   ?Gastrointestinal:  Positive for nausea and vomiting. Negative for abdominal pain, anal bleeding, blood in stool and diarrhea.  ?Genitourinary:  Negative for dysuria.  ?Skin:  Positive for color change and wound.  ?Neurological:  Positive for weakness (generalized).  ?All other systems reviewed and are negative. ? ?Physical Exam ?Updated Vital Signs ?BP (!) 154/111   Pulse 91   Temp 98.1 ?F (36.7 ?C)   Resp 18   SpO2 96%  ?Physical Exam ?Vitals and nursing note reviewed.  ?Constitutional:   ?   General: He is not in acute distress. ?   Appearance: He is well-developed. He is not toxic-appearing.  ?HENT:  ?   Head: Normocephalic and atraumatic.  ?Eyes:  ?   General:     ?   Right eye: No discharge.     ?   Left eye: No discharge.  ?   Conjunctiva/sclera: Conjunctivae normal.  ?Cardiovascular:  ?   Rate and Rhythm: Normal rate and regular rhythm.  ?Pulmonary:  ?   Effort: No respiratory distress.  ?   Breath sounds: Normal breath sounds. No wheezing or rales.  ?Abdominal:  ?   General: There is no distension.  ?   Palpations: Abdomen is soft.  ?   Tenderness: There is no abdominal tenderness.  ?Musculoskeletal:  ?   Cervical back: Neck supple.  ?   Comments: Sacral ulcer present as pictured below with surrounding skin erythema, mildly warm to the touch. Some drainage present.  ?S/p bilateral BKAs  ?Skin: ?   General: Skin is warm and dry.  ?Neurological:  ?   Mental Status: He is alert.  ?   Comments: Clear speech.   ?Psychiatric:     ?   Behavior: Behavior normal.  ? ? ? ?ED Results / Procedures / Treatments   ?Labs ?(all labs ordered are listed, but only abnormal results are displayed) ?Labs Reviewed  ?LACTIC ACID, PLASMA - Abnormal; Notable for the following components:  ?    Result Value  ? Lactic Acid, Venous 2.1 (*)   ? All other components within normal limits  ?COMPREHENSIVE METABOLIC PANEL - Abnormal; Notable for the following components:  ? Sodium 131 (*)   ? Chloride 97 (*)   ? Glucose, Bld 365 (*)   ?  Calcium 8.6 (*)   ? Total Protein 6.3 (*)   ? Albumin 3.2 (*)   ? All other components within normal limits  ?URINALYSIS, ROUTINE W REFLEX MICROSCOPIC - Abnormal; Notable for the following components:  ? APPearance HAZY (*)   ? Glucose, UA >=500 (*)   ? Hgb urine dipstick LARGE (*)   ? Protein, ur 100 (*)   ? Leukocytes,Ua LARGE (*)   ? RBC / HPF >50 (*)   ? Bacteria, UA FEW (*)   ? All other components within normal limits  ?CULTURE, BLOOD (ROUTINE X 2)  ?CULTURE, BLOOD (ROUTINE X 2)  ?LACTIC ACID, PLASMA  ?CBC WITH DIFFERENTIAL/PLATELET  ?SEDIMENTATION RATE  ?C-REACTIVE PROTEIN  ?BASIC  METABOLIC PANEL  ?CBC  ?C-REACTIVE PROTEIN  ?SEDIMENTATION RATE  ? ? ?EKG ?None ? ?Radiology ?DG Chest 2 View ? ?Result Date: 12/28/2021 ?CLINICAL DATA:  Weakness EXAM: CHEST - 2 VIEW COMPARISON:  12/15/2021 FINDINGS: Linear areas of atelectasis in the right lung base. Left lung clear. Elevation of the right hemidiaphragm. Heart is normal size. No effusion or pneumothorax. No acute bony abnormality. IMPRESSION: Elevated right hemidiaphragm with right base atelectasis. Electronically Signed   By: Rolm Baptise M.D.   On: 12/28/2021 21:26  ? ?CT PELVIS W CONTRAST ? ?Result Date: 12/29/2021 ?CLINICAL DATA:  Pelvic osteomyelitis suspected.  CP EXAM: CT PELVIS WITH CONTRAST TECHNIQUE: Multidetector CT imaging of the pelvis was performed using the standard protocol following the bolus administration of intravenous contrast. RADIATION DOSE REDUCTION: This exam was performed according to the departmental dose-optimization program which includes automated exposure control, adjustment of the mA and/or kV according to patient size and/or use of iterative reconstruction technique. CONTRAST:  150m OMNIPAQUE IOHEXOL 300 MG/ML  SOLN COMPARISON:  CT abdomen pelvis with contrast recently 12/15/2021 FINDINGS: Urinary Tract: The distal ureters are clear. The bladder is again noted catheterized and severely thickened, unchanged. Bowel: No dilatation or  wall thickening. Scattered uncomplicated sigmoid diverticula. An appendix is not seen in this patient. Vascular/Lymphatic: There is nonstenosing aortoiliac calcific plaque. No lymphadenopathy is seen. Reproductive: The

## 2021-12-28 NOTE — Assessment & Plan Note (Addendum)
due to current stage IV ulcer, present since hospitalization 07/2021-09/2021 ?

## 2021-12-28 NOTE — ED Triage Notes (Signed)
Pt presents with "not feeling well"  Pt has sacral wound that has become worse and has had trouble getting supplies for his wound vac.  Pt reports redness to wound with chills and general feeling of weakness.  ? ?

## 2021-12-28 NOTE — ED Notes (Signed)
Urine sent to lab WITH a culture.  ?

## 2021-12-28 NOTE — Assessment & Plan Note (Addendum)
pt states ulcer started while hospitalized from 07/2021-09/2021. he has [place] from [facility] changing dressings with a negative pressure vac, his HHRN is requesting a KCI vac instead because first brand is not working well. He is requesting I send an order, but he has a first appointment with the wound center on Wednesday, would prefer they initiate since they will be managing his care. pt showed a picture taken this morning of wound, approx 3cm in diameter, (with 2cm depth and 3cm tunneling per review of hosp wound note) treating now with saline wet to dry qd until new vac starts. ?

## 2021-12-28 NOTE — Assessment & Plan Note (Signed)
c/o Low back and sacral pain r/t current decubitus. Requesting regular OXY, states he feels nothing with Xt[place]. Sees pain mgt for spinal injections, but states he can't get treated now b/c of his ulcer. Advised he will need new pain mgt if wound center does not manage. Advised pt I do not do pain mgt and can only give him lowest dose for 7d today. ?

## 2021-12-29 ENCOUNTER — Other Ambulatory Visit: Payer: Self-pay

## 2021-12-29 ENCOUNTER — Encounter (HOSPITAL_COMMUNITY): Payer: Self-pay | Admitting: Family Medicine

## 2021-12-29 DIAGNOSIS — N1831 Chronic kidney disease, stage 3a: Secondary | ICD-10-CM | POA: Diagnosis present

## 2021-12-29 DIAGNOSIS — Z794 Long term (current) use of insulin: Secondary | ICD-10-CM

## 2021-12-29 DIAGNOSIS — I5022 Chronic systolic (congestive) heart failure: Secondary | ICD-10-CM | POA: Diagnosis not present

## 2021-12-29 DIAGNOSIS — I5042 Chronic combined systolic (congestive) and diastolic (congestive) heart failure: Secondary | ICD-10-CM | POA: Diagnosis present

## 2021-12-29 DIAGNOSIS — L308 Other specified dermatitis: Secondary | ICD-10-CM | POA: Diagnosis present

## 2021-12-29 DIAGNOSIS — R739 Hyperglycemia, unspecified: Secondary | ICD-10-CM | POA: Diagnosis present

## 2021-12-29 DIAGNOSIS — L89154 Pressure ulcer of sacral region, stage 4: Secondary | ICD-10-CM | POA: Diagnosis present

## 2021-12-29 DIAGNOSIS — E1142 Type 2 diabetes mellitus with diabetic polyneuropathy: Secondary | ICD-10-CM

## 2021-12-29 DIAGNOSIS — Z89511 Acquired absence of right leg below knee: Secondary | ICD-10-CM | POA: Diagnosis not present

## 2021-12-29 DIAGNOSIS — L98429 Non-pressure chronic ulcer of back with unspecified severity: Secondary | ICD-10-CM

## 2021-12-29 DIAGNOSIS — N2889 Other specified disorders of kidney and ureter: Secondary | ICD-10-CM | POA: Diagnosis present

## 2021-12-29 DIAGNOSIS — G8929 Other chronic pain: Secondary | ICD-10-CM | POA: Diagnosis present

## 2021-12-29 DIAGNOSIS — L98494 Non-pressure chronic ulcer of skin of other sites with necrosis of bone: Secondary | ICD-10-CM

## 2021-12-29 DIAGNOSIS — M4628 Osteomyelitis of vertebra, sacral and sacrococcygeal region: Secondary | ICD-10-CM | POA: Diagnosis present

## 2021-12-29 DIAGNOSIS — I4892 Unspecified atrial flutter: Secondary | ICD-10-CM | POA: Diagnosis present

## 2021-12-29 DIAGNOSIS — Z79890 Hormone replacement therapy (postmenopausal): Secondary | ICD-10-CM | POA: Diagnosis not present

## 2021-12-29 DIAGNOSIS — E1169 Type 2 diabetes mellitus with other specified complication: Secondary | ICD-10-CM | POA: Diagnosis present

## 2021-12-29 DIAGNOSIS — E1165 Type 2 diabetes mellitus with hyperglycemia: Secondary | ICD-10-CM | POA: Diagnosis present

## 2021-12-29 DIAGNOSIS — F32A Depression, unspecified: Secondary | ICD-10-CM | POA: Diagnosis present

## 2021-12-29 DIAGNOSIS — N390 Urinary tract infection, site not specified: Secondary | ICD-10-CM | POA: Diagnosis present

## 2021-12-29 DIAGNOSIS — Z89512 Acquired absence of left leg below knee: Secondary | ICD-10-CM | POA: Diagnosis not present

## 2021-12-29 DIAGNOSIS — L089 Local infection of the skin and subcutaneous tissue, unspecified: Secondary | ICD-10-CM | POA: Diagnosis present

## 2021-12-29 DIAGNOSIS — E1122 Type 2 diabetes mellitus with diabetic chronic kidney disease: Secondary | ICD-10-CM | POA: Diagnosis present

## 2021-12-29 DIAGNOSIS — Z7901 Long term (current) use of anticoagulants: Secondary | ICD-10-CM | POA: Diagnosis not present

## 2021-12-29 DIAGNOSIS — Z881 Allergy status to other antibiotic agents status: Secondary | ICD-10-CM | POA: Diagnosis not present

## 2021-12-29 DIAGNOSIS — Z79899 Other long term (current) drug therapy: Secondary | ICD-10-CM | POA: Diagnosis not present

## 2021-12-29 DIAGNOSIS — Z79891 Long term (current) use of opiate analgesic: Secondary | ICD-10-CM | POA: Diagnosis not present

## 2021-12-29 DIAGNOSIS — I130 Hypertensive heart and chronic kidney disease with heart failure and stage 1 through stage 4 chronic kidney disease, or unspecified chronic kidney disease: Secondary | ICD-10-CM | POA: Diagnosis present

## 2021-12-29 DIAGNOSIS — I480 Paroxysmal atrial fibrillation: Secondary | ICD-10-CM | POA: Diagnosis present

## 2021-12-29 HISTORY — DX: Non-pressure chronic ulcer of back with unspecified severity: L98.429

## 2021-12-29 LAB — SEDIMENTATION RATE
Sed Rate: 13 mm/hr (ref 0–16)
Sed Rate: 12 mm/hr (ref 0–16)

## 2021-12-29 LAB — CBC
HCT: 42 % (ref 39.0–52.0)
Hemoglobin: 13.9 g/dL (ref 13.0–17.0)
MCH: 29.7 pg (ref 26.0–34.0)
MCHC: 33.1 g/dL (ref 30.0–36.0)
MCV: 89.7 fL (ref 80.0–100.0)
Platelets: 227 10*3/uL (ref 150–400)
RBC: 4.68 MIL/uL (ref 4.22–5.81)
RDW: 14.9 % (ref 11.5–15.5)
WBC: 7.5 10*3/uL (ref 4.0–10.5)
nRBC: 0 % (ref 0.0–0.2)

## 2021-12-29 LAB — BASIC METABOLIC PANEL
Anion gap: 8 (ref 5–15)
BUN: 10 mg/dL (ref 6–20)
CO2: 28 mmol/L (ref 22–32)
Calcium: 8.3 mg/dL — ABNORMAL LOW (ref 8.9–10.3)
Chloride: 98 mmol/L (ref 98–111)
Creatinine, Ser: 1.08 mg/dL (ref 0.61–1.24)
GFR, Estimated: >60 mL/min (ref >60)
Glucose, Bld: 413 mg/dL — ABNORMAL HIGH (ref 70–99)
Potassium: 3.4 mmol/L — ABNORMAL LOW (ref 3.5–5.1)
Sodium: 134 mmol/L — ABNORMAL LOW (ref 135–145)

## 2021-12-29 LAB — GLUCOSE, CAPILLARY
Glucose-Capillary: 265 mg/dL — ABNORMAL HIGH (ref 70–99)
Glucose-Capillary: 293 mg/dL — ABNORMAL HIGH (ref 70–99)

## 2021-12-29 LAB — C-REACTIVE PROTEIN
CRP: 0.6 mg/dL (ref <1.0)
CRP: 1.1 mg/dL — ABNORMAL HIGH (ref <1.0)

## 2021-12-29 LAB — CBG MONITORING, ED
Glucose-Capillary: 358 mg/dL — ABNORMAL HIGH (ref 70–99)
Glucose-Capillary: 236 mg/dL — ABNORMAL HIGH (ref 70–99)

## 2021-12-29 MED ORDER — FUROSEMIDE 40 MG PO TABS
40.0000 mg | ORAL_TABLET | Freq: Every day | ORAL | Status: DC
  Administered 2021-12-29 – 2021-12-31 (×3): 40 mg via ORAL
  Filled 2021-12-29: qty 2
  Filled 2021-12-29 (×2): qty 1

## 2021-12-29 MED ORDER — POTASSIUM CHLORIDE CRYS ER 20 MEQ PO TBCR
40.0000 meq | EXTENDED_RELEASE_TABLET | Freq: Once | ORAL | Status: AC
Start: 2021-12-29 — End: 2021-12-29
  Administered 2021-12-29: 40 meq via ORAL
  Filled 2021-12-29: qty 2

## 2021-12-29 MED ORDER — HYDROMORPHONE HCL 1 MG/ML IJ SOLN
0.5000 mg | INTRAMUSCULAR | Status: DC | PRN
  Administered 2021-12-29 – 2021-12-31 (×10): 0.5 mg via INTRAVENOUS
  Filled 2021-12-29 (×2): qty 0.5
  Filled 2021-12-29: qty 1
  Filled 2021-12-29 (×7): qty 0.5

## 2021-12-29 MED ORDER — ONDANSETRON HCL 4 MG/2ML IJ SOLN
4.0000 mg | Freq: Four times a day (QID) | INTRAMUSCULAR | Status: DC | PRN
Start: 2021-12-29 — End: 2021-12-31

## 2021-12-29 MED ORDER — HYDROMORPHONE HCL 1 MG/ML IJ SOLN
1.0000 mg | Freq: Once | INTRAMUSCULAR | Status: AC
  Administered 2021-12-29: 1 mg via INTRAVENOUS
  Filled 2021-12-29: qty 1

## 2021-12-29 MED ORDER — TRAZODONE HCL 150 MG PO TABS
150.0000 mg | ORAL_TABLET | Freq: Every evening | ORAL | Status: DC | PRN
  Administered 2021-12-30: 150 mg via ORAL
  Filled 2021-12-29: qty 1

## 2021-12-29 MED ORDER — HYDROMORPHONE HCL 2 MG PO TABS
4.0000 mg | ORAL_TABLET | ORAL | Status: DC | PRN
  Administered 2021-12-29: 4 mg via ORAL
  Filled 2021-12-29: qty 2

## 2021-12-29 MED ORDER — AMIODARONE HCL 200 MG PO TABS
200.0000 mg | ORAL_TABLET | Freq: Every day | ORAL | Status: DC
  Administered 2021-12-29 – 2021-12-31 (×3): 200 mg via ORAL
  Filled 2021-12-29 (×3): qty 1

## 2021-12-29 MED ORDER — GABAPENTIN 100 MG PO CAPS
400.0000 mg | ORAL_CAPSULE | Freq: Three times a day (TID) | ORAL | Status: DC
  Filled 2021-12-29 (×5): qty 4

## 2021-12-29 MED ORDER — POLYETHYLENE GLYCOL 3350 17 G PO PACK: 17.0000 g | PACK | Freq: Every day | ORAL | Status: DC | PRN

## 2021-12-29 MED ORDER — SODIUM CHLORIDE 0.9 % IV SOLN
2.0000 g | Freq: Three times a day (TID) | INTRAVENOUS | Status: DC
  Administered 2021-12-29 – 2021-12-30 (×5): 2 g via INTRAVENOUS
  Filled 2021-12-29 (×5): qty 12.5

## 2021-12-29 MED ORDER — CEFTRIAXONE SODIUM 2 G IJ SOLR
2.0000 g | Freq: Once | INTRAMUSCULAR | Status: AC
Start: 2021-12-29 — End: 2021-12-29
  Administered 2021-12-29: 2 g via INTRAVENOUS
  Filled 2021-12-29: qty 20

## 2021-12-29 MED ORDER — NYSTATIN-TRIAMCINOLONE 100000-0.1 UNIT/GM-% EX CREA
TOPICAL_CREAM | Freq: Two times a day (BID) | CUTANEOUS | Status: DC
  Administered 2021-12-30: 1 via TOPICAL
  Filled 2021-12-29: qty 15
  Filled 2021-12-29: qty 30

## 2021-12-29 MED ORDER — OXYCODONE HCL 5 MG PO TABS: 5.0000 mg | ORAL_TABLET | ORAL | Status: DC | PRN

## 2021-12-29 MED ORDER — INSULIN GLARGINE-YFGN 100 UNIT/ML ~~LOC~~ SOLN
15.0000 [IU] | Freq: Every day | SUBCUTANEOUS | Status: DC
  Administered 2021-12-29: 15 [IU] via SUBCUTANEOUS
  Filled 2021-12-29 (×3): qty 0.15

## 2021-12-29 MED ORDER — FINASTERIDE 5 MG PO TABS
5.0000 mg | ORAL_TABLET | Freq: Every day | ORAL | Status: DC
  Administered 2021-12-29 – 2021-12-31 (×3): 5 mg via ORAL
  Filled 2021-12-29 (×3): qty 1

## 2021-12-29 MED ORDER — INSULIN ASPART 100 UNIT/ML IJ SOLN: 0.0000 [IU] | Freq: Every day | INTRAMUSCULAR | Status: DC

## 2021-12-29 MED ORDER — ACETAMINOPHEN 650 MG RE SUPP: 650.0000 mg | Freq: Four times a day (QID) | RECTAL | Status: DC | PRN

## 2021-12-29 MED ORDER — OXYCODONE HCL 5 MG PO TABS
5.0000 mg | ORAL_TABLET | ORAL | Status: DC | PRN
  Administered 2021-12-29 – 2021-12-31 (×10): 10 mg via ORAL
  Filled 2021-12-29 (×10): qty 2

## 2021-12-29 MED ORDER — METOPROLOL SUCCINATE ER 25 MG PO TB24
25.0000 mg | ORAL_TABLET | Freq: Every day | ORAL | Status: DC
  Administered 2021-12-29 – 2021-12-31 (×3): 25 mg via ORAL
  Filled 2021-12-29 (×3): qty 1

## 2021-12-29 MED ORDER — APIXABAN 5 MG PO TABS
5.0000 mg | ORAL_TABLET | Freq: Two times a day (BID) | ORAL | Status: DC
  Administered 2021-12-29 – 2021-12-31 (×5): 5 mg via ORAL
  Filled 2021-12-29 (×5): qty 1

## 2021-12-29 MED ORDER — NYSTATIN-TRIAMCINOLONE 100000-0.1 UNIT/GM-% EX CREA
TOPICAL_CREAM | Freq: Two times a day (BID) | CUTANEOUS | Status: DC
Start: 2021-12-29 — End: 2021-12-29
  Filled 2021-12-29: qty 15

## 2021-12-29 MED ORDER — INSULIN ASPART 100 UNIT/ML IJ SOLN
0.0000 [IU] | Freq: Three times a day (TID) | INTRAMUSCULAR | Status: DC
  Administered 2021-12-29: 9 [IU] via SUBCUTANEOUS
  Administered 2021-12-29: 3 [IU] via SUBCUTANEOUS
  Administered 2021-12-29 – 2021-12-30 (×2): 5 [IU] via SUBCUTANEOUS

## 2021-12-29 MED ORDER — BISACODYL 5 MG PO TBEC: 5.0000 mg | DELAYED_RELEASE_TABLET | Freq: Every day | ORAL | Status: DC | PRN

## 2021-12-29 MED ORDER — VANCOMYCIN HCL IN DEXTROSE 1-5 GM/200ML-% IV SOLN
1000.0000 mg | Freq: Two times a day (BID) | INTRAVENOUS | Status: DC
Start: 2021-12-29 — End: 2021-12-30
  Administered 2021-12-29 – 2021-12-30 (×3): 1000 mg via INTRAVENOUS
  Filled 2021-12-29 (×4): qty 200

## 2021-12-29 MED ORDER — TAMSULOSIN HCL 0.4 MG PO CAPS
0.4000 mg | ORAL_CAPSULE | Freq: Every day | ORAL | Status: DC
  Administered 2021-12-29 – 2021-12-31 (×3): 0.4 mg via ORAL
  Filled 2021-12-29 (×3): qty 1

## 2021-12-29 MED ORDER — IBUPROFEN 400 MG PO TABS
400.0000 mg | ORAL_TABLET | Freq: Once | ORAL | Status: AC
  Administered 2021-12-29: 400 mg via ORAL
  Filled 2021-12-29: qty 1

## 2021-12-29 MED ORDER — HYDROMORPHONE HCL 1 MG/ML IJ SOLN
0.5000 mg | Freq: Once | INTRAMUSCULAR | Status: AC
  Administered 2021-12-29: 0.5 mg via INTRAVENOUS
  Filled 2021-12-29: qty 1

## 2021-12-29 MED ORDER — VANCOMYCIN HCL 2000 MG/400ML IV SOLN
2000.0000 mg | Freq: Once | INTRAVENOUS | Status: AC
Start: 2021-12-29 — End: 2021-12-29
  Administered 2021-12-29: 2000 mg via INTRAVENOUS
  Filled 2021-12-29: qty 400

## 2021-12-29 MED ORDER — METHOCARBAMOL 500 MG PO TABS
1000.0000 mg | ORAL_TABLET | Freq: Three times a day (TID) | ORAL | Status: DC
  Administered 2021-12-29: 1000 mg via ORAL
  Filled 2021-12-29 (×4): qty 2

## 2021-12-29 MED ORDER — ACETAMINOPHEN 325 MG PO TABS: 650.0000 mg | ORAL_TABLET | Freq: Four times a day (QID) | ORAL | Status: DC | PRN

## 2021-12-29 MED ORDER — ONDANSETRON HCL 4 MG PO TABS
4.0000 mg | ORAL_TABLET | Freq: Four times a day (QID) | ORAL | Status: DC | PRN
  Administered 2021-12-30 – 2021-12-31 (×3): 4 mg via ORAL
  Filled 2021-12-29 (×2): qty 1

## 2021-12-29 NOTE — Consult Note (Signed)
Reason for Consult/CC: Sacral decubitus ulcer ? ?[name] is an 56 y.o. male.  ?HPI: Patient is a 56 year old with a sacral decubitus ulcer.  I discussed the ulcer with the patient and he notes that it has been present since his acute hospitalization in November.  He is not interested in any flap reconstruction at this time and is in the fact not allowing renal biopsy of renal mass.  He was unable to obtain wound VAC supplies and his wound has been mostly neglected recently. ? ?Allergies:  ?Allergies  ?Allergen Reactions  ? Bactrim [Sulfamethoxazole-Trimethoprim]   ? Ceprotin [Protein C Concentrate (Human)]   ? Ciprofloxacin Other (See Comments)  ?  Kidney function  ? Levaquin [Levofloxacin]   ? ? ?Medications:  ?Current Facility-Administered Medications:  ?  acetaminophen (TYLENOL) tablet 650 mg, 650 mg, Oral, Q6H PRN **OR** acetaminophen (TYLENOL) suppository 650 mg, 650 mg, Rectal, Q6H PRN, Opyd, Ilene Qua, MD ?  amiodarone (PACERONE) tablet 200 mg, 200 mg, Oral, Daily, Opyd, Ilene Qua, MD, 200 mg at 12/29/21 7628 ?  apixaban (ELIQUIS) tablet 5 mg, 5 mg, Oral, BID, Opyd, Ilene Qua, MD, 5 mg at 12/29/21 2101 ?  bisacodyl (DULCOLAX) EC tablet 5 mg, 5 mg, Oral, Daily PRN, Opyd, Ilene Qua, MD ?  ceFEPIme (MAXIPIME) 2 g in sodium chloride 0.9 % 100 mL IVPB, 2 g, Intravenous, Q8H, Opyd, Timothy S, MD, Last Rate: 200 mL/hr at 12/29/21 1719, 2 g at 12/29/21 1719 ?  finasteride (PROSCAR) tablet 5 mg, 5 mg, Oral, Daily, Opyd, Ilene Qua, MD, 5 mg at 12/29/21 3151 ?  furosemide (LASIX) tablet 40 mg, 40 mg, Oral, Daily, Opyd, Ilene Qua, MD, 40 mg at 12/29/21 7616 ?  gabapentin (NEURONTIN) capsule 400 mg, 400 mg, Oral, TID, Opyd, Ilene Qua, MD ?  HYDROmorphone (DILAUDID) injection 0.5 mg, 0.5 mg, Intravenous, Q4H PRN, Hosie Poisson, MD, 0.5 mg at 12/29/21 2157 ?  insulin aspart (novoLOG) injection 0-5 Units, 0-5 Units, Subcutaneous, QHS, Opyd, Timothy S, MD ?  insulin aspart (novoLOG) injection 0-9 Units, 0-9 Units,  Subcutaneous, TID WC, Opyd, Ilene Qua, MD, 5 Units at 12/29/21 1705 ?  insulin glargine-yfgn (SEMGLEE) injection 15 Units, 15 Units, Subcutaneous, QHS, Opyd, Ilene Qua, MD, 15 Units at 12/29/21 2157 ?  methocarbamol (ROBAXIN) tablet 1,000 mg, 1,000 mg, Oral, TID, Opyd, Ilene Qua, MD, 1,000 mg at 12/29/21 2131 ?  metoprolol succinate (TOPROL-XL) 24 hr tablet 25 mg, 25 mg, Oral, Daily, Opyd, Ilene Qua, MD, 25 mg at 12/29/21 0737 ?  nystatin-triamcinolone (MYCOLOG II) cream, , Topical, BID, Hosie Poisson, MD, Given at 12/29/21 1849 ?  ondansetron (ZOFRAN) tablet 4 mg, 4 mg, Oral, Q6H PRN **OR** ondansetron (ZOFRAN) injection 4 mg, 4 mg, Intravenous, Q6H PRN, Opyd, Ilene Qua, MD ?  oxyCODONE (Oxy IR/ROXICODONE) immediate release tablet 5-10 mg, 5-10 mg, Oral, Q4H PRN, Hosie Poisson, MD, 10 mg at 12/29/21 2029 ?  polyethylene glycol (MIRALAX / GLYCOLAX) packet 17 g, 17 g, Oral, Daily PRN, Opyd, Ilene Qua, MD ?  tamsulosin (FLOMAX) capsule 0.4 mg, 0.4 mg, Oral, Daily, Opyd, Ilene Qua, MD, 0.4 mg at 12/29/21 0953 ?  traZODone (DESYREL) tablet 150 mg, 150 mg, Oral, QHS PRN, Opyd, Ilene Qua, MD ?  vancomycin (VANCOCIN) IVPB 1000 mg/200 mL premix, 1,000 mg, Intravenous, Q12H, Opyd, Ilene Qua, MD, Last Rate: 200 mL/hr at 12/29/21 1848, 1,000 mg at 12/29/21 1848 ? ?Past Medical History:  ?Diagnosis Date  ? Acquired complex renal cyst 09/25/2019  ? AKI (acute kidney injury) ([place])  07/22/2021  ? Chest pain 07/22/2021  ? Chronic diastolic CHF (congestive heart failure) ([place])   ? Complication of anesthesia   ? Decubitus ulcer of sacral area 12/15/2021  ? Depression   ? Diabetes mellitus without complication ([place])   ? Healthcare maintenance 09/25/2019  ? Hematuria 09/25/2019  ? Hypertension   ? Lactic acidosis 12/15/2021  ? Leukocytosis 09/13/2021  ? Sacral pressure injury of skin 07/27/2021  ? Septic shock ([place]) 07/22/2021  ? Severe sepsis with acute organ dysfunction ([place]) 07/22/2021  ? Shortness of breath 05/16/2021  ? Stage 3a chronic  kidney disease ([place]) 09/25/2019  ? Systolic dysfunction   ? ? ?Past Surgical History:  ?Procedure Laterality Date  ? AMPUTATION Left 07/23/2021  ? Procedure: LEFT BELOW KNEE AMPUTATION;  Surgeon: [name], MD;  Location: [place];  Service: Orthopedics;  Laterality: Left;  ? BELOW KNEE LEG AMPUTATION Right   ? BUBBLE STUDY  07/29/2021  ? Procedure: BUBBLE STUDY;  Surgeon: Sueanne Margarita, MD;  Location: [place];  Service: Cardiovascular;;  ? CARDIOVERSION N/A 07/29/2021  ? Procedure: CARDIOVERSION;  Surgeon: Sueanne Margarita, MD;  Location: [place];  Service: Cardiovascular;  Laterality: N/A;  ? RADIOLOGY WITH ANESTHESIA N/A 08/05/2021  ? Procedure: MRI LUMBAR WITH AND WITHOUT; THORASIC SPINE WITH AND WITHOUT WITH ANESTHESIA;  Surgeon: Radiologist, Medication, MD;  Location: [place];  Service: Radiology;  Laterality: N/A;  ? RADIOLOGY WITH ANESTHESIA N/A 08/07/2021  ? Procedure: MRI WITH LUMBER WITH AND WITHOUT CONTRAST,THORACIC WITH AND WITHOUT CONTRAST;  Surgeon: Radiologist, Medication, MD;  Location: [place];  Service: Radiology;  Laterality: N/A;  ? RADIOLOGY WITH ANESTHESIA N/A 09/13/2021  ? Procedure: MRI WITH ANESTHESIA;  Surgeon: Luanne Bras, MD;  Location: [place];  Service: Radiology;  Laterality: N/A;  ? TEE WITHOUT CARDIOVERSION N/A 07/29/2021  ? Procedure: TRANSESOPHAGEAL ECHOCARDIOGRAM (TEE);  Surgeon: Sueanne Margarita, MD;  Location: [place];  Service: Cardiovascular;  Laterality: N/A;  ? ? ?Family History  ?Problem Relation Age of Onset  ? Anxiety disorder Mother   ? Cancer Father   ? Coronary artery disease Father   ? ? ?Social History:  reports that he has quit smoking. His smoking use included cigarettes. He has never used smokeless tobacco. He reports that he does not currently use alcohol. He reports that he does not use drugs. ? ?Physical Exam ?Blood pressure 138/78, pulse 67, temperature (!) 97.5 ?F (36.4 ?C), temperature source Oral, resp. rate 18, SpO2 97 %. ?Extremity sacral  decubitus ulcer without any significant abundance of necrotic tissue.  Moderate drainage.  Skin around the ulcer appears very irritated and erythematous although it appears more like irritation from his wound VAC than cellulitis. ? ?Results for orders placed or performed during the hospital encounter of 12/28/21 (from the past 48 hour(s))  ?Lactic acid, plasma     Status: Abnormal  ? Collection Time: 12/28/21  9:06 PM  ?Result Value Ref Range  ? Lactic Acid, Venous 2.1 (HH) 0.5 - 1.9 mmol/L  ?  Comment: CRITICAL RESULT CALLED TO, READ BACK BY AND VERIFIED WITH: ?THOMPSON A,RN 12/28/21 [place] ?Performed at [facility], [place] [address], [place], [place] [address] ?  ?Comprehensive metabolic panel     Status: Abnormal  ? Collection Time: 12/28/21  9:06 PM  ?Result Value Ref Range  ? Sodium 131 (L) 135 - 145 mmol/L  ? Potassium 3.9 3.5 - 5.1 mmol/L  ? Chloride 97 (L) 98 - 111 mmol/L  ? CO2  25 22 - 32 mmol/L  ? Glucose, Bld 365 (H) 70 - 99 mg/dL  ?  Comment: Glucose reference range applies only to samples taken after fasting for at least 8 hours.  ? BUN 12 6 - 20 mg/dL  ? Creatinine, Ser 0.92 0.61 - 1.24 mg/dL  ? Calcium 8.6 (L) 8.9 - 10.3 mg/dL  ? Total Protein 6.3 (L) 6.5 - 8.1 g/dL  ? Albumin 3.2 (L) 3.5 - 5.0 g/dL  ? AST 22 15 - 41 U/L  ? ALT 22 0 - 44 U/L  ? Alkaline Phosphatase 97 38 - 126 U/L  ? Total Bilirubin 0.4 0.3 - 1.2 mg/dL  ? GFR, Estimated >60 >60 mL/min  ?  Comment: (NOTE) ?Calculated using the CKD-EPI Creatinine Equation (2021) ?  ? Anion gap 9 5 - 15  ?  Comment: Performed at [facility], [place] [address], [place], [place] [address]  ?CBC with Differential     Status: None  ? Collection Time: 12/28/21  9:06 PM  ?Result Value Ref Range  ? WBC 8.6 4.0 - 10.5 K/uL  ? RBC 4.97 4.22 - 5.81 MIL/uL  ? Hemoglobin 15.1 13.0 - 17.0 g/dL  ? HCT 44.0 39.0 - 52.0 %  ? MCV 88.5 80.0 - 100.0 fL  ? MCH 30.4 26.0 - 34.0 pg  ? MCHC 34.3 30.0 - 36.0 g/dL  ? RDW 14.8 11.5 - 15.5 %  ? Platelets 255 150 - 400  K/uL  ? nRBC 0.0 0.0 - 0.2 %  ? Neutrophils Relative % 82 %  ? Neutro Abs 7.1 1.7 - 7.7 K/uL  ? Lymphocytes Relative 8 %  ? Lymphs Abs 0.7 0.7 - 4.0 K/uL  ? Monocytes Relative 6 %  ? Monocytes Absolute 0.5 0.

## 2021-12-29 NOTE — ED Notes (Signed)
Patient stated "I'm not going to wait on the doctor for some pain meds. Give me the damn pills." Patient stated "I don't like to take pills because of my kidneys." Patient was given dilaudid PO with some cranberry Juice. Patient agitation at ease in this moment. Patient was repositioned and made comfortable. I emptied his foley catheter bag while in the room. ?

## 2021-12-29 NOTE — ED Notes (Signed)
I cleaned pt's sacral ulcer w/NS and applied silver hydrofiber, 2x2 gauze and a foam dressing. Pt tolerated okay. ?

## 2021-12-29 NOTE — ED Notes (Signed)
Placed pt on hospital bed w/air mattress. ?

## 2021-12-29 NOTE — Progress Notes (Signed)
Pharmacy Antibiotic Note ? ?[name] is a 56 y.o. male admitted on 12/28/2021 with infected sacral ulcer.  Pharmacy has been consulted for Vancomycin and Cefepime  dosing.  Vancomycin 2 g IV given in ED at 0220.   ? ?Plan: ?Vancomycin 1000 mg IV q12h ?Cefepime 2 g IV q8h ? ?  ? ?Temp (24hrs), Avg:98.1 ?F (36.7 ?C), Min:98.1 ?F (36.7 ?C), Max:98.1 ?F (36.7 ?C) ? ?Recent Labs  ?Lab 12/28/21 ?2106 12/28/21 ?2328 12/29/21 ?3570  ?WBC 8.6  --  7.5  ?CREATININE 0.92  --  1.08  ?LATICACIDVEN 2.1* 1.7  --   ?  ?CrCl cannot be calculated (Unknown ideal weight.).   ? ?Allergies  ?Allergen Reactions  ? Bactrim [Sulfamethoxazole-Trimethoprim]   ? Ceprotin [Protein C Concentrate (Human)]   ? Ciprofloxacin Other (See Comments)  ?  Kidney function  ? Levaquin [Levofloxacin]   ? ? ? ?[name], [name] ?12/29/2021 5:36 AM ? ?

## 2021-12-29 NOTE — ED Notes (Signed)
Breakfast order placed ?

## 2021-12-29 NOTE — TOC CM/SW Note (Signed)
Patient from home with [facility] .[facility] will need resumption of care orders . TOC team will continue to follow  ?

## 2021-12-29 NOTE — H&P (Signed)
?History and Physical  ? ? ?[name] ZOX:[id number] DOB: May 04, 1966 DOA: 12/28/2021 ? ?PCP: [name], NP  ? ?Patient coming from: Home  ? ?Chief Complaint: Worsening sacral wound  ? ?HPI: [name] is a 57 y.o. male with medical history significant for insulin-dependent diabetes mellitus, chronic systolic CHF, chronic pain, renal mass, chronic Foley catheter, sacral ulcer with chronic osteomyelitis, and recent admission with aspiration pneumonia and UTI after an episode of unresponsiveness, now presenting with worsening in his sacral ulcer.  Patient reports that he has had difficulty obtaining wound VAC supplies and has had increasing diameter of his sacral ulcer with surrounding redness, drainage, and has developed chills, nausea, and vomiting.  He denies abdominal pain or diarrhea.  He denies any cough or shortness of breath. ? ?ED Course: Upon arrival to the ED, patient is found to be afebrile and saturating well on room air with stable blood pressure.  EKG features sinus rhythm.  Chest x-ray with elevated right hemidiaphragm and atelectasis at the right base.  CT of the pelvis demonstrates his sacral decubitus ulcer with osteomyelitis of the S5 and first 2 coccygeal segments.  Chemistry panel notable for glucose 365 without DKA.  CBC unremarkable.  Initial lactic acid was elevated.  Blood culture was collected and the patient was treated with analgesics and IV antibiotics. ? ?Review of Systems:  ?All other systems reviewed and apart from HPI, are negative. ? ?Past Medical History:  ?Diagnosis Date  ? Acquired complex renal cyst 09/25/2019  ? AKI (acute kidney injury) ([place]) 07/22/2021  ? Chest pain 07/22/2021  ? Chronic diastolic CHF (congestive heart failure) ([place])   ? Complication of anesthesia   ? Decubitus ulcer of sacral area 12/15/2021  ? Depression   ? Diabetes mellitus without complication ([place])   ? Healthcare maintenance 09/25/2019  ? Hematuria 09/25/2019  ? Hypertension   ? Lactic  acidosis 12/15/2021  ? Leukocytosis 09/13/2021  ? Sacral pressure injury of skin 07/27/2021  ? Septic shock ([place]) 07/22/2021  ? Severe sepsis with acute organ dysfunction ([place]) 07/22/2021  ? Shortness of breath 05/16/2021  ? Stage 3a chronic kidney disease ([place]) 09/25/2019  ? Systolic dysfunction   ? ? ?Past Surgical History:  ?Procedure Laterality Date  ? AMPUTATION Left 07/23/2021  ? Procedure: LEFT BELOW KNEE AMPUTATION;  Surgeon: [name], MD;  Location: [place];  Service: Orthopedics;  Laterality: Left;  ? BELOW KNEE LEG AMPUTATION Right   ? BUBBLE STUDY  07/29/2021  ? Procedure: BUBBLE STUDY;  Surgeon: Sueanne Margarita, MD;  Location: [place];  Service: Cardiovascular;;  ? CARDIOVERSION N/A 07/29/2021  ? Procedure: CARDIOVERSION;  Surgeon: Sueanne Margarita, MD;  Location: [place];  Service: Cardiovascular;  Laterality: N/A;  ? RADIOLOGY WITH ANESTHESIA N/A 08/05/2021  ? Procedure: MRI LUMBAR WITH AND WITHOUT; THORASIC SPINE WITH AND WITHOUT WITH ANESTHESIA;  Surgeon: Radiologist, Medication, MD;  Location: [place];  Service: Radiology;  Laterality: N/A;  ? RADIOLOGY WITH ANESTHESIA N/A 08/07/2021  ? Procedure: MRI WITH LUMBER WITH AND WITHOUT CONTRAST,THORACIC WITH AND WITHOUT CONTRAST;  Surgeon: Radiologist, Medication, MD;  Location: [place];  Service: Radiology;  Laterality: N/A;  ? RADIOLOGY WITH ANESTHESIA N/A 09/13/2021  ? Procedure: MRI WITH ANESTHESIA;  Surgeon: Luanne Bras, MD;  Location: [place];  Service: Radiology;  Laterality: N/A;  ? TEE WITHOUT CARDIOVERSION N/A 07/29/2021  ? Procedure: TRANSESOPHAGEAL ECHOCARDIOGRAM (TEE);  Surgeon: Sueanne Margarita, MD;  Location: [place];  Service: Cardiovascular;  Laterality: N/A;  ? ? ?  Social History:  ? reports that he has quit smoking. His smoking use included cigarettes. He has never used smokeless tobacco. He reports that he does not currently use alcohol. He reports that he does not use drugs. ? ?Allergies  ?Allergen Reactions  ? Bactrim  [Sulfamethoxazole-Trimethoprim]   ? Ceprotin [Protein C Concentrate (Human)]   ? Ciprofloxacin Other (See Comments)  ?  Kidney function  ? Levaquin [Levofloxacin]   ? ? ?Family History  ?Problem Relation Age of Onset  ? Anxiety disorder Mother   ? Cancer Father   ? Coronary artery disease Father   ? ? ? ?Prior to Admission medications   ?Medication Sig Start Date End Date Taking? Authorizing Provider  ?amiodarone (PACERONE) 200 MG tablet Take 1 tablet (200 mg total) by mouth daily. 12/05/21 02/03/22  [name], NP  ?apixaban (ELIQUIS) 5 MG TABS tablet Take 1 tablet (5 mg total) by mouth 2 (two) times daily. 12/18/21 01/17/22  Barb Merino, MD  ?blood glucose meter kit and supplies KIT Dispense based on patient and insurance preference. Use up to four times daily as directed. 12/05/21   [name], NP  ?finasteride (PROSCAR) 5 MG tablet Take 1 tablet (5 mg total) by mouth daily. 12/05/21 03/05/22  [name], NP  ?furosemide (LASIX) 40 MG tablet Take 40 mg by mouth daily. 12/09/21   [provider]  ?gabapentin (NEURONTIN) 400 MG capsule Take 1 capsule (400 mg total) by mouth 3 (three) times daily. 12/05/21 02/03/22  [name], NP  ?insulin NPH-regular Human (NOVOLIN 70/30 RELION) (70-30) 100 UNIT/ML injection Inject 15 Units into the skin 2 (two) times daily with a meal. 12/18/21   Barb Merino, MD  ?Insulin Pen Needle 32G X 4 MM MISC Use as directed 09/17/21   Modena Jansky, MD  ?insulin regular (NOVOLIN R) 100 units/mL injection Inject 0-20 Units into the skin 4 (four) times daily as needed for high blood sugar.    [provider]  ?methocarbamol (ROBAXIN) 500 MG tablet Take 1,000 mg by mouth 3 (three) times daily. 09/09/21   [provider]  ?metoprolol succinate (TOPROL-XL) 25 MG 24 hr tablet Take 1 tablet (25 mg total) by mouth daily. 12/05/21 02/03/22  [name], NP  ?oxyCODONE-acetaminophen (PERCOCET/ROXICET) 5-325 MG tablet Take 1 tablet by mouth  daily after breakfast for 7 days. 12/26/21 01/02/22  [name], NP  ?protein supplement shake (PREMIER PROTEIN) LIQD Take 2 oz by mouth 3 (three) times daily with meals.    [provider]  ?tamsulosin (FLOMAX) 0.4 MG CAPS capsule Take 1 capsule (0.4 mg total) by mouth daily. 12/05/21 03/05/22  [name], NP  ?testosterone cypionate (DEPOTESTOSTERONE CYPIONATE) 200 MG/ML injection Inject 1 mL (200 mg total) into the muscle every 14 (fourteen) days. 12/05/21   [place], Jake Church I, NP  ?traZODone (DESYREL) 150 MG tablet TAKE 1 TABLET(150 MG) BY MOUTH AT BEDTIME AS NEEDED FOR SLEEP 12/17/21   [name], NP  ? ? ?Physical Exam: ?Vitals:  ? 12/28/21 2044 12/28/21 2044 12/29/21 0053 12/29/21 0123  ?BP:  (!) 154/111 (!) 162/90 (!) 159/97  ?Pulse:  91 71 72  ?Resp:  '18 19 20  ' ?Temp: 98.1 ?F (36.7 ?C)     ?SpO2:  96% 98% 97%  ? ? ?Constitutional: NAD, calm  ?Eyes: PERTLA, lids and conjunctivae normal ?ENMT: Mucous membranes are moist. Posterior pharynx clear of any exudate or lesions.   ?Neck: supple, no masses  ?Respiratory: no wheezing,  no crackles. No accessory muscle use.  ?Cardiovascular: S1 & S2 heard, regular rate and rhythm. No extremity edema.  ?Abdomen: No distension, no tenderness, soft. Bowel sounds active.  ?Musculoskeletal: no clubbing / cyanosis. S/p b/l BKA.   ?Skin: Sacral ulcer with surrounding erythema, foul odor, and thick yellow drainage. Otherwise warm, dry, well-perfused. ?Neurologic: CN 2-12 grossly intact. Alert and oriented.  ?Psychiatric: Calm. Cooperative.  ? ? ?Labs and Imaging on Admission: I have personally reviewed following labs and imaging studies ? ?CBC: ?Recent Labs  ?Lab 12/28/21 ?2106  ?WBC 8.6  ?NEUTROABS 7.1  ?HGB 15.1  ?HCT 44.0  ?MCV 88.5  ?PLT 255  ? ?Basic Metabolic Panel: ?Recent Labs  ?Lab 12/28/21 ?2106  ?NA 131*  ?K 3.9  ?CL 97*  ?CO2 25  ?GLUCOSE 365*  ?BUN 12  ?CREATININE 0.92  ?CALCIUM 8.6*  ? ?GFR: ?CrCl cannot be calculated (Unknown ideal  weight.). ?Liver Function Tests: ?Recent Labs  ?Lab 12/28/21 ?2106  ?AST 22  ?ALT 22  ?ALKPHOS 97  ?BILITOT 0.4  ?PROT 6.3*  ?ALBUMIN 3.2*  ? ?No results for input(s): LIPASE, AMYLASE in the last 168 hours. ?No results

## 2021-12-29 NOTE — Consult Note (Signed)
[facility] Nurse Consult Note: ?Reason for Consult:sacral wound ?Recent admission for same. Familiar to the [facility] nursing team.  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 Patient had similar drainage at the time of his last admission  ?Periwound:  ?SEVERE contact dermatitis; discussed in details with patient product use over the last few days.  The presentation is in the pattern of a dressing or windowframe of tape.  Denies any new products.  ?Has NOT had VAC in place since last admission (using [employer]) model; [place] per patient. Sounds like his script that was initiated in CIR has expired and primary care MD's both new and old will not reorder supplies and therapy.  ? ?Dressing procedure/placement/frequency: ?I do not think NPWT is appropriate at the time; severe contact dermatitis with heavily draining wound. Not progressing due to macerated periwound and SEVERE epibole ?2. WOC requested unit secretary in the ED to order low air loss mattress ?3. Silver hydrofiber to absorb excess exudate and for chronic wound; fill with fluff gauze (because of the amount of drainage) ?4. Top with foam for exudate and to hopefully address periwound maceration  ?5. Treat contact dermatitis with antifungal and monitor for improvement  ? ?Discussed with patient rationale for not using NPWT while inpatient, he agrees ?  ?Patient has wound care center appointment on Wednesday of this week; asked patient to keep this appointment for now.  Most likely will need surgical opening of wound edges and possibly plastic surgery involvement once patient maximized medically  ? ?Discussed POC with patient and bedside nurse.  ?Re consult if needed, will not follow at this time. ?Thanks ? Feleica Fulmore  [facility] MSN, RN,CWOCN, CNS, CWON-AP [phone])  ?  ?

## 2021-12-29 NOTE — ED Notes (Signed)
Bs 358 ?

## 2021-12-29 NOTE — ED Notes (Signed)
This patient requested pain medication. Patient refused PO dilaudid and stated he only wanted IV dilaudid. MD messaged and made aware. Pt getting agitated with staff. Patient states "I didn't cause this shit, this hospital did". "This is bullshit, doctors keep switching up medications." "Pills are harder on my kidneys". I advised the patient that I have notified the doc. Patient stated "I would walk out if he could". ?

## 2021-12-29 NOTE — Progress Notes (Signed)
Pt admitted earlier this am. Please see note from Dr Myna Hidalgo for detailed H&P.  ? ?[name] is a 56 y.o. male with medical history significant for insulin-dependent diabetes mellitus, chronic systolic CHF, chronic pain, renal mass, chronic Foley catheter, sacral ulcer with chronic osteomyelitis, and recent admission with aspiration pneumonia and UTI after an episode of unresponsiveness, now presenting with worsening in his sacral ulcer. ?Unable to obtain wound VAC SUPPLIES.  ?Worsening sacral ulcer with surrounding redness.  ?Wound care consulted and recommendations given.  ?General surgery consulted , suggested that patient does not need any debridement at this time. The redness around the sacral ulcer probably dermatitis from the wound vac itself.  ?Plastic surgery consulted, will see patient.  ? ? ?Hosie Poisson, MD ?

## 2021-12-30 DIAGNOSIS — L98494 Non-pressure chronic ulcer of skin of other sites with necrosis of bone: Secondary | ICD-10-CM | POA: Diagnosis not present

## 2021-12-30 DIAGNOSIS — I4892 Unspecified atrial flutter: Secondary | ICD-10-CM | POA: Diagnosis not present

## 2021-12-30 DIAGNOSIS — I5022 Chronic systolic (congestive) heart failure: Secondary | ICD-10-CM | POA: Diagnosis not present

## 2021-12-30 DIAGNOSIS — R739 Hyperglycemia, unspecified: Secondary | ICD-10-CM | POA: Diagnosis not present

## 2021-12-30 LAB — BASIC METABOLIC PANEL
Anion gap: 6 (ref 5–15)
BUN: 11 mg/dL (ref 6–20)
CO2: 27 mmol/L (ref 22–32)
Calcium: 8.8 mg/dL — ABNORMAL LOW (ref 8.9–10.3)
Chloride: 100 mmol/L (ref 98–111)
Creatinine, Ser: 1 mg/dL (ref 0.61–1.24)
GFR, Estimated: >60 mL/min (ref >60)
Glucose, Bld: 326 mg/dL — ABNORMAL HIGH (ref 70–99)
Potassium: 3.8 mmol/L (ref 3.5–5.1)
Sodium: 133 mmol/L — ABNORMAL LOW (ref 135–145)

## 2021-12-30 LAB — CBC
HCT: 42.9 % (ref 39.0–52.0)
Hemoglobin: 14.6 g/dL (ref 13.0–17.0)
MCH: 30 pg (ref 26.0–34.0)
MCHC: 34 g/dL (ref 30.0–36.0)
MCV: 88.1 fL (ref 80.0–100.0)
Platelets: 244 10*3/uL (ref 150–400)
RBC: 4.87 MIL/uL (ref 4.22–5.81)
RDW: 15.1 % (ref 11.5–15.5)
WBC: 7.3 10*3/uL (ref 4.0–10.5)
nRBC: 0 % (ref 0.0–0.2)

## 2021-12-30 LAB — GLUCOSE, CAPILLARY
Glucose-Capillary: 286 mg/dL — ABNORMAL HIGH (ref 70–99)
Glucose-Capillary: 393 mg/dL — ABNORMAL HIGH (ref 70–99)
Glucose-Capillary: 205 mg/dL — ABNORMAL HIGH (ref 70–99)
Glucose-Capillary: 155 mg/dL — ABNORMAL HIGH (ref 70–99)

## 2021-12-30 LAB — C-REACTIVE PROTEIN: CRP: 0.7 mg/dL (ref <1.0)

## 2021-12-30 LAB — SEDIMENTATION RATE: Sed Rate: 11 mm/hr (ref 0–16)

## 2021-12-30 LAB — PROCALCITONIN: Procalcitonin: <0.1 ng/mL

## 2021-12-30 MED ORDER — INSULIN GLARGINE-YFGN 100 UNIT/ML ~~LOC~~ SOLN
20.0000 [IU] | Freq: Every day | SUBCUTANEOUS | Status: DC
  Filled 2021-12-30: qty 0.2

## 2021-12-30 MED ORDER — CHLORHEXIDINE GLUCONATE CLOTH 2 % EX PADS
6.0000 | MEDICATED_PAD | Freq: Every day | CUTANEOUS | Status: DC
  Administered 2021-12-30 – 2021-12-31 (×2): 6 via TOPICAL

## 2021-12-30 MED ORDER — LISINOPRIL 5 MG PO TABS
5.0000 mg | ORAL_TABLET | Freq: Every day | ORAL | Status: DC
  Administered 2021-12-30: 5 mg via ORAL
  Filled 2021-12-30 (×2): qty 1

## 2021-12-30 MED ORDER — INSULIN ASPART 100 UNIT/ML IJ SOLN
3.0000 [IU] | Freq: Three times a day (TID) | INTRAMUSCULAR | Status: DC
  Administered 2021-12-30 – 2021-12-31 (×3): 3 [IU] via SUBCUTANEOUS

## 2021-12-30 MED ORDER — INSULIN GLARGINE-YFGN 100 UNIT/ML ~~LOC~~ SOLN
24.0000 [IU] | Freq: Every day | SUBCUTANEOUS | Status: DC
  Administered 2021-12-30: 24 [IU] via SUBCUTANEOUS
  Filled 2021-12-30 (×2): qty 0.24

## 2021-12-30 MED ORDER — INSULIN ASPART 100 UNIT/ML IJ SOLN
0.0000 [IU] | Freq: Three times a day (TID) | INTRAMUSCULAR | Status: DC
  Administered 2021-12-30: 5 [IU] via SUBCUTANEOUS
  Administered 2021-12-30: 15 [IU] via SUBCUTANEOUS
  Administered 2021-12-31: 3 [IU] via SUBCUTANEOUS
  Administered 2021-12-31: 8 [IU] via SUBCUTANEOUS

## 2021-12-30 NOTE — Telephone Encounter (Signed)
ok, thank you

## 2021-12-30 NOTE — Consult Note (Signed)
? ?  [facility] CM Inpatient Consult ? ? ?12/30/2021 ? ?[name] ?[birth year] ?[id number] ? ?[place] [facility] [AC[facility]] Patient: [employer] ? ?Referral:  Patient was assessed for unplanned readmission in less than 30 days with noted high risk score risk ? ?Primary Care Provider:  [name], NP, [place] [facility] is an embedded provider with a Chronic Care Management team and program, and is listed for the transition of care follow up and appointments. ? ?Patient was screened for Embedded practice service needs for chronic care management ? ?Plan: Reviewed and information sent regarding readmission.  A referral can be sent to the Embedded Care Management team for post hospital care as needs are known. Continue to follow for progress and disposition. ? ?Please contact for further questions, ? ?Natividad Brood, RN BSN CCM ?[facility] Liaison ? [phone] business mobile phone ?Toll free office [phone]  ?Fax number: [phone] ?[email or web address] ? ? ? ?

## 2021-12-30 NOTE — TOC Initial Note (Addendum)
Transition of Care (TOC) - Initial/Assessment Note  ? ? ?Patient Details  ?Name: [name] ?MRN: [id number] ?Date of Birth: [birth year] ? ?Transition of Care (TOC) CM/SW Contact:    ?Marilu Favre, RN ?Phone Number: ?12/30/2021, 11:15 AM ? ?Clinical Narrative:                 ?Patient from home alone, however has a friend who "helps a lot".  ? ?Patient active with [facility] . Anderson Malta with [facility] aware of patient's admission and will need updated HHRN orders and face to face.  ? ?Patient has wheel chair. Patient has hospital bed with regular mattress at. Will order low air loss mattress for home, patient in agreement.  ? ?Ordered loss air loss mattress with Jasmine with [facility]. Patient's insurance requires prior authorization which may take a couple days . Jasmine will call patient and explain. He can wait for prior auth to receive low air loss mattress or they can supply a gel overlay mattress and if they receive authorization for low air loss mattress , [employer] will pick up gel mattress and deliver low air loss mattress. Await patient's decision. Asked MD to sign order for low air loss mattress. ? ?Patient would like gel overlay mattress while waiting for insurance authorization for low air loss mattress. Order entered for gel overlay mattress per Jasmine's request  ? ? ?At time of discharge his friend will provide transportation home. ? ?Patient staying due to pain control. [place] appointment tomorrow at 1315. Will see if patient able to discharge tomorrow morning or if appointment needs to be rescheduled.  ? ?Asked MD to sign orders  ? ?Asked bedside nurse to provide patient and friend with wound care education  ? ?MD is consulting ID  ? ?Expected Discharge Plan: [place] ?Barriers to Discharge: Continued Medical Work up ? ? ?Patient Goals and CMS Choice ?Patient states their goals for this hospitalization and ongoing recovery are:: to return to home ?CMS  Medicare.gov Compare Post Acute Care list provided to:: Patient ?Choice offered to / list presented to : Patient ? ?Expected Discharge Plan and Services ?Expected Discharge Plan: [place] ?  ?Discharge Planning Services: CM Consult ?Post Acute Care Choice: Home Health ?Living arrangements for the past 2 months: Apartment ?                ?DME Arranged: Other see comment ?DME Agency: [employer] ?Date DME Agency Contacted: 12/30/21 ?Time DME Agency Contacted: [phone] ?Representative spoke with at DME Agency: Delana Meyer ?HH Arranged: RN ?[place] Agency: [facility] ?Date HH Agency Contacted: 12/30/21 ?Time [place]: 8811 ?Representative spoke with at [place]: Anderson Malta ? ?Prior Living Arrangements/Services ?Living arrangements for the past 2 months: Apartment ?Lives with:: Self ?Patient language and need for interpreter reviewed:: Yes ?Do you feel safe going back to the place where you live?: Yes      ?Need for Family Participation in Patient Care: Yes (Comment) ?Care giver support system in place?: Yes (comment) ?Current home services: DME ?Criminal Activity/Legal Involvement Pertinent to Current Situation/Hospitalization: No - Comment as needed ? ?Activities of Daily Living ?Home Assistive Devices/Equipment: Wheelchair, Hospital bed ?ADL Screening (condition at time of admission) ?Patient's cognitive ability adequate to safely complete daily activities?: Yes ?Is the patient deaf or have difficulty hearing?: No ?Does the patient have difficulty seeing, even when wearing glasses/contacts?: No ?Does the patient have difficulty concentrating, remembering, or making decisions?: No ?Patient able  to express need for assistance with ADLs?: Yes ?Does the patient have difficulty dressing or bathing?: Yes ?Independently performs ADLs?: No ?Communication: Independent ?Dressing (OT): Needs assistance ?Is this a change from baseline?: Pre-admission baseline ?Grooming: Independent ?Feeding:  Independent ?Bathing: Needs assistance ?Is this a change from baseline?: Pre-admission baseline ?Toileting: Needs assistance ?Is this a change from baseline?: Pre-admission baseline ?In/Out Bed: Needs assistance ?Is this a change from baseline?: Pre-admission baseline ?Walks in Home: Needs assistance ?Is this a change from baseline?: Pre-admission baseline ?Does the patient have difficulty walking or climbing stairs?: Yes ?Weakness of Legs: Both ?Weakness of Arms/Hands: None ? ?Permission Sought/Granted ?  ?Permission granted to share information with : No ?   ?   ?   ?   ? ?Emotional Assessment ?Appearance:: Appears stated age ?Attitude/Demeanor/Rapport: Engaged ?Affect (typically observed): Accepting ?Orientation: : Oriented to Place, Oriented to  Time, Oriented to Situation, Oriented to Self ?Alcohol / Substance Use: Not Applicable ?Psych Involvement: No (comment) ? ?Admission diagnosis:  Hyperglycemia [R73.9] ?Sacral osteomyelitis ([place]) [M46.28] ?Infected ulcer of skin ([place]) [L98.499, L08.9] ?Patient Active Problem List  ? Diagnosis Date Noted  ? Infected ulcer of skin ([place]) 12/29/2021  ? Episode of unresponsiveness 12/15/2021  ? Acute respiratory failure with hypoxia (HCC) secondary to suspected aspiration pneumonia 12/15/2021  ? Renal mass 12/15/2021  ? Complicated UTI (urinary tract infection) 12/15/2021  ? Stage IV pressure ulcer of sacral region ([place]) 09/15/2021  ? Insomnia 09/14/2021  ? Chronic systolic CHF (congestive heart failure) ([place]) 09/14/2021  ? Hypogonadism in male 09/14/2021  ? Chronic pain 09/14/2021  ? Sacral pain 09/13/2021  ? Pseudohyponatremia 09/13/2021  ? Normocytic anemia 09/13/2021  ? Discitis of thoracic region   ? Recent bacteremia due to group B Streptococcus, Streptococcus agalactiae, thoracic discitis/osteomyelitis with epidural abscess   ? Gas gangrene of foot ([place]) 07/22/2021  ? Paroxysmal atrial flutter ([place]) 07/22/2021  ? Chronic diastolic (congestive) heart failure ([place])  05/16/2021  ? Diabetic infection of left foot ([place]) 04/17/2021  ? S/P bilateral BKA (below knee amputation) ([place]) 10/30/2019  ? Dyslipidemia 10/30/2019  ? Type 2 diabetes mellitus with diabetic polyneuropathy, with long-term current use of insulin ([place]) 10/12/2019  ? Type 2 diabetes mellitus 10/12/2019  ? Essential hypertension 09/25/2019  ? Type 2 diabetes mellitus with hyperglycemia, with long-term current use of insulin ([place]) 09/25/2019  ? Urinary hesitancy 09/25/2019  ? Prosthesis adjustments 09/25/2019  ? Erectile dysfunction 09/25/2019  ? Pulmonary nodules 09/25/2019  ? Depression with anxiety 09/25/2019  ? ?PCP:  [name], NP ?Pharmacy:   ?[place] ([place]), [place] - [address] ?[address] ?[place] [address] ?Phone: [phone] Fax: [phone] ? ?[id number] - [place], [place] ?[place] ?[place] [address] ?Phone: [phone] Fax: [phone] ? ? ? ? ?Social Determinants of Health (SDOH) Interventions ?  ? ?Readmission Risk Interventions ?   ? View : No data to display.  ?  ?  ?  ? ? ? ?

## 2021-12-30 NOTE — Progress Notes (Signed)
? ? ? [name] ?                                                                            ? ? ?[name], is a 56 y.o. male, DOB - Aug 20, 1966, [id number] ?Admit date - 12/28/2021    ?Outpatient Primary MD for the patient is [name] Sewer, NP ? ?LOS - 1  days ? ? ? ?Brief summary  ? ?[name] is a 56 y.o. male with medical history significant for insulin-dependent diabetes mellitus, chronic systolic CHF, chronic pain, renal mass, chronic Foley catheter, sacral ulcer with chronic osteomyelitis, and recent admission with aspiration pneumonia and UTI after an episode of unresponsiveness, now presenting with worsening in his sacral ulcer. ?Unable to obtain wound VAC SUPPLIES.  ?Worsening sacral ulcer with surrounding redness.  ?Wound care consulted and recommendations given.  ?General surgery consulted , suggested that patient does not need any debridement at this time. The redness around the sacral ulcer probably dermatitis from the wound vac itself.  ?Plastic surgery consulted, recommended to stop the wound vac, and unfortunately he is uninterested in any possible flap reconstruction .  ?  ?  ? ? ?Assessment & Plan  ? ? ?Assessment and Plan: ? ? ? ?Sacral decubitus ulcer with moderate drainage, with surrounding redness from the irritation and dermatitis from wound vac.  ?Wound vac was removed on admission.  ?General surgery looked at the pictures suggested that patient does not need debridement.  ?Plastic surgery consulted, as pt is not interested in flap reconstruction, recommended outpatient follow if he changes his mind.  ?No signs of infection at this time. Will stop the antibiotics after tonight's dose.  ? ? ? ?Insulin dependent DM: uncontrolled with hyperglycemia.  ?A1c is 8.6% last month.  ?CBG (last 3)  ?Recent Labs  ?  12/30/21 ?0802 12/30/21 ?1116 12/30/21 ?1619  ?GLUCAP 286* 393* 205*  ? ?Increased Semglee to 24 units and added novolog 3 units TIDAC. Continue with SSI.  ?Pt  was on Novolog 70/30 15 units BID at home.  ?  ? ? ?Sacral coccygeal osteomyelitis:  ?Seen on CT from January 2023, completed the treatment. Discussed with Dr Linus Salmons with Infectious Disease. No indication of antibiotics at this time. It appears to be chronic osteomyelitis.  ?He is afebrile and no leukocytosis.  ?Will check procalcitonin. Plan to stop antibiotics after tonight's dose.  ? ? ? ?Uncontrolled pain from the sacral ulcer;  ?Currently on dilaudid 0.5 mg every 4 hours and oxycodone, with adequate control.  ? ? ? ?Chronic systolic heart failure:  ?He appears to be compensated.  ?Continue with lasix, lisinopril added.  ? ?PAtrial flutter ?Rate controlled with amiodarone.  ?Continue with Eliquis.  ? ? ? ?Renal mass ?Pt should follow up with Dr Louis Meckel as outpatient. He is under surveillance   ? ? ? ?Unstageable sacral ulcer:  ? ?RN Pressure Injury Documentation: ?Pressure Injury 07/27/21 Sacrum Mid Unstageable - Full thickness tissue loss in which the base of the injury is covered by slough (yellow, tan, gray, green or brown) and/or eschar (tan, brown or black) in the wound bed. small pink pressure ulcer with bre (Active)  ?07/27/21 0255  ?Location: Sacrum  ?Location  Orientation: Mid  ?Staging: Unstageable - Full thickness tissue loss in which the base of the injury is covered by slough (yellow, tan, gray, green or brown) and/or eschar (tan, brown or black) in the wound bed.  ?Wound Description (Comments): small pink pressure ulcer with break in skin 08/12/21; updated to unstageable pressure injury  ?Present on Admission: Yes  ?   ?Pressure Injury 09/30/21 Sacrum Lower;Mid Stage 3 -  Full thickness tissue loss. Subcutaneous fat may be visible but bone, tendon or muscle are NOT exposed. stage 3 w/tunneling, has pink bedding in middle of wound and green eschar around the inner sides  (Active)  ?09/30/21 1712  ?Location: Sacrum  ?Location Orientation: Lower;Mid  ?Staging: Stage 3 -  Full thickness tissue loss.  Subcutaneous fat may be visible but bone, tendon or muscle are NOT exposed.  ?Wound Description (Comments): stage 3 w/tunneling, has pink bedding in middle of wound and green eschar around the inner sides surrounding th ebedding of wound.  ?Present on Admission: Yes  ?Dressing Type Silver dressings;Foam - Lift dressing to assess site every shift 12/29/21 1500  ? ?Wound care on board and appreciate recommendations.  ? ?  ? ?Estimated body mass index is 36.54 kg/m? as calculated from the following: ?  Height as of 12/16/21: '5\' 8"'$  (1.727 m). ?  Weight as of 09/14/21: 109 kg. ? ?Code Status: Partial code.  ?DVT Prophylaxis:   ?apixaban (ELIQUIS) tablet 5 mg  ? ?Level of Care: Level of care: Med-Surg ?Family Communication: none at bedside.  ? ?Disposition Plan:     Remains inpatient appropriate:  requiring IV pain meds for pain control.  ? ?Procedures:  ?None.  ? ?Consultants:   ?Plastic surgery.  ?Gen surgery.  ?Curb side with Dr Linus Salmons  ? ?Antimicrobials:  ? ?Anti-infectives (From admission, onward)  ? ? Start     Dose/Rate Route Frequency Ordered Stop  ? 12/29/21 1800  vancomycin (VANCOCIN) IVPB 1000 mg/200 mL premix       ? 1,000 mg ?200 mL/hr over 60 Minutes Intravenous Every 12 hours 12/29/21 0542    ? 12/29/21 0800  ceFEPIme (MAXIPIME) 2 g in sodium chloride 0.9 % 100 mL IVPB       ? 2 g ?200 mL/hr over 30 Minutes Intravenous Every 8 hours 12/29/21 0542    ? 12/29/21 0200  vancomycin (VANCOREADY) IVPB 2000 mg/400 mL       ? 2,000 mg ?200 mL/hr over 120 Minutes Intravenous  Once 12/29/21 0118 12/29/21 0437  ? 12/29/21 0100  cefTRIAXone (ROCEPHIN) 2 g in sodium chloride 0.9 % 100 mL IVPB       ? 2 g ?200 mL/hr over 30 Minutes Intravenous  Once 12/29/21 0053 12/29/21 0213  ? ?  ? ? ? ?Medications ? ?Scheduled Meds: ? amiodarone  200 mg Oral Daily  ? apixaban  5 mg Oral BID  ? Chlorhexidine Gluconate Cloth  6 each Topical Daily  ? finasteride  5 mg Oral Daily  ? furosemide  40 mg Oral Daily  ? gabapentin  400 mg Oral TID   ? insulin aspart  0-15 Units Subcutaneous TID WC  ? insulin aspart  0-5 Units Subcutaneous QHS  ? insulin aspart  3 Units Subcutaneous TID WC  ? insulin glargine-yfgn  20 Units Subcutaneous QHS  ? methocarbamol  1,000 mg Oral TID  ? metoprolol succinate  25 mg Oral Daily  ? nystatin-triamcinolone   Topical BID  ? tamsulosin  0.4 mg Oral Daily  ? ?  Continuous Infusions: ? ceFEPime (MAXIPIME) IV 2 g (12/30/21 1512)  ? vancomycin Stopped (12/30/21 0630)  ? ?PRN Meds:.acetaminophen **OR** acetaminophen, bisacodyl, HYDROmorphone (DILAUDID) injection, ondansetron **OR** ondansetron (ZOFRAN) IV, oxyCODONE, polyethylene glycol, traZODone ? ? ? ?Subjective:  ? ?[name] was seen and examined today.  Pt requesting for IV pain medication.  ? ?Objective:  ? ?Vitals:  ? 12/30/21 0010 12/30/21 0521 12/30/21 0800 12/30/21 1523  ?BP: 105/60 114/72 119/83 113/76  ?Pulse: 71 64 69 76  ?Resp: '17 18 18 18  '$ ?Temp: 98.5 ?F (36.9 ?C) (!) 97.5 ?F (36.4 ?C) (!) 97.5 ?F (36.4 ?C) 98.1 ?F (36.7 ?C)  ?TempSrc: Oral Oral Oral Oral  ?SpO2: 97% 97% 97% 97%  ? ? ?Intake/Output Summary (Last 24 hours) at 12/30/2021 1623 ?Last data filed at 12/30/2021 1120 ?Gross per 24 hour  ?Intake 740.62 ml  ?Output 900 ml  ?Net -159.38 ml  ? ?There were no vitals filed for this visit. ? ? ?Exam ?General: Alert and oriented x 3, NAD ?Cardiovascular: S1 S2 auscultated, no murmurs, RRR ?Respiratory: Clear to auscultation bilaterally, no wheezing, rales or rhonchi ?Gastrointestinal: Soft, nontender, nondistended, + bowel sounds ?Ext: s/p bilateral BKA.  ?Neuro: AAOx3,  ?Skin:  sacral decubitus ulcer.  ?Psych: Normal affect and demeanor, alert and oriented x3  ? ? ?Data Reviewed:  I have personally reviewed following labs and imaging studies ? ? ?CBC ?Lab Results  ?Component Value Date  ? WBC 7.3 12/30/2021  ? RBC 4.87 12/30/2021  ? HGB 14.6 12/30/2021  ? HCT 42.9 12/30/2021  ? MCV 88.1 12/30/2021  ? MCH 30.0 12/30/2021  ? PLT 244 12/30/2021  ? MCHC 34.0  12/30/2021  ? RDW 15.1 12/30/2021  ? LYMPHSABS 0.7 12/28/2021  ? MONOABS 0.5 12/28/2021  ? EOSABS 0.3 12/28/2021  ? BASOSABS 0.0 12/28/2021  ? ? ? ?Last metabolic panel ?Lab Results  ?Component Value Date  ? NA 133

## 2021-12-31 ENCOUNTER — Telehealth: Payer: Self-pay | Admitting: Family

## 2021-12-31 ENCOUNTER — Encounter (HOSPITAL_BASED_OUTPATIENT_CLINIC_OR_DEPARTMENT_OTHER): Payer: Medicare HMO | Admitting: Physician Assistant

## 2021-12-31 DIAGNOSIS — N320 Bladder-neck obstruction: Secondary | ICD-10-CM

## 2021-12-31 DIAGNOSIS — I5020 Unspecified systolic (congestive) heart failure: Secondary | ICD-10-CM | POA: Diagnosis not present

## 2021-12-31 DIAGNOSIS — L89154 Pressure ulcer of sacral region, stage 4: Secondary | ICD-10-CM | POA: Diagnosis not present

## 2021-12-31 DIAGNOSIS — Z794 Long term (current) use of insulin: Secondary | ICD-10-CM

## 2021-12-31 DIAGNOSIS — I130 Hypertensive heart and chronic kidney disease with heart failure and stage 1 through stage 4 chronic kidney disease, or unspecified chronic kidney disease: Secondary | ICD-10-CM | POA: Diagnosis not present

## 2021-12-31 DIAGNOSIS — M47817 Spondylosis without myelopathy or radiculopathy, lumbosacral region: Secondary | ICD-10-CM

## 2021-12-31 DIAGNOSIS — Z466 Encounter for fitting and adjustment of urinary device: Secondary | ICD-10-CM

## 2021-12-31 DIAGNOSIS — M4628 Osteomyelitis of vertebra, sacral and sacrococcygeal region: Secondary | ICD-10-CM | POA: Diagnosis not present

## 2021-12-31 DIAGNOSIS — D631 Anemia in chronic kidney disease: Secondary | ICD-10-CM | POA: Diagnosis not present

## 2021-12-31 DIAGNOSIS — E1165 Type 2 diabetes mellitus with hyperglycemia: Secondary | ICD-10-CM | POA: Diagnosis not present

## 2021-12-31 DIAGNOSIS — N1831 Chronic kidney disease, stage 3a: Secondary | ICD-10-CM | POA: Diagnosis not present

## 2021-12-31 DIAGNOSIS — F339 Major depressive disorder, recurrent, unspecified: Secondary | ICD-10-CM

## 2021-12-31 DIAGNOSIS — I5022 Chronic systolic (congestive) heart failure: Secondary | ICD-10-CM | POA: Diagnosis not present

## 2021-12-31 DIAGNOSIS — Z89612 Acquired absence of left leg above knee: Secondary | ICD-10-CM

## 2021-12-31 DIAGNOSIS — I700 Atherosclerosis of aorta: Secondary | ICD-10-CM

## 2021-12-31 DIAGNOSIS — E1169 Type 2 diabetes mellitus with other specified complication: Secondary | ICD-10-CM | POA: Diagnosis not present

## 2021-12-31 DIAGNOSIS — G4700 Insomnia, unspecified: Secondary | ICD-10-CM

## 2021-12-31 DIAGNOSIS — E785 Hyperlipidemia, unspecified: Secondary | ICD-10-CM

## 2021-12-31 DIAGNOSIS — Z9181 History of falling: Secondary | ICD-10-CM

## 2021-12-31 DIAGNOSIS — Z7901 Long term (current) use of anticoagulants: Secondary | ICD-10-CM

## 2021-12-31 DIAGNOSIS — E1122 Type 2 diabetes mellitus with diabetic chronic kidney disease: Secondary | ICD-10-CM | POA: Diagnosis not present

## 2021-12-31 DIAGNOSIS — R739 Hyperglycemia, unspecified: Secondary | ICD-10-CM | POA: Diagnosis not present

## 2021-12-31 DIAGNOSIS — Z89512 Acquired absence of left leg below knee: Secondary | ICD-10-CM

## 2021-12-31 DIAGNOSIS — F419 Anxiety disorder, unspecified: Secondary | ICD-10-CM

## 2021-12-31 LAB — BASIC METABOLIC PANEL
Anion gap: 8 (ref 5–15)
BUN: 17 mg/dL (ref 6–20)
CO2: 26 mmol/L (ref 22–32)
Calcium: 8.8 mg/dL — ABNORMAL LOW (ref 8.9–10.3)
Chloride: 100 mmol/L (ref 98–111)
Creatinine, Ser: 1.33 mg/dL — ABNORMAL HIGH (ref 0.61–1.24)
GFR, Estimated: >60 mL/min (ref >60)
Glucose, Bld: 208 mg/dL — ABNORMAL HIGH (ref 70–99)
Potassium: 4.2 mmol/L (ref 3.5–5.1)
Sodium: 134 mmol/L — ABNORMAL LOW (ref 135–145)

## 2021-12-31 LAB — CBC
HCT: 42.8 % (ref 39.0–52.0)
Hemoglobin: 14.3 g/dL (ref 13.0–17.0)
MCH: 29.7 pg (ref 26.0–34.0)
MCHC: 33.4 g/dL (ref 30.0–36.0)
MCV: 88.8 fL (ref 80.0–100.0)
Platelets: 253 10*3/uL (ref 150–400)
RBC: 4.82 MIL/uL (ref 4.22–5.81)
RDW: 15.2 % (ref 11.5–15.5)
WBC: 8.3 10*3/uL (ref 4.0–10.5)
nRBC: 0 % (ref 0.0–0.2)

## 2021-12-31 LAB — GLUCOSE, CAPILLARY
Glucose-Capillary: 193 mg/dL — ABNORMAL HIGH (ref 70–99)
Glucose-Capillary: 258 mg/dL — ABNORMAL HIGH (ref 70–99)

## 2021-12-31 NOTE — Telephone Encounter (Signed)
Paperwork received.

## 2021-12-31 NOTE — Telephone Encounter (Signed)
..  Home Health Certification or Plan of Care Tracking ? ?Is this a Certification or Plan of Care? Yes ? ?[place] Agency: [facility] ? ?Order Number:  [id number] ? ?Has charge sheet been attached? yes ? ?Where has form been placed:  In provider's box ? ?Faxed to:   [phone] ?

## 2021-12-31 NOTE — Discharge Summary (Addendum)
? ?Physician Discharge Summary  ?[name] ENI:[id number] DOB: 1965/10/18 DOA: 12/28/2021 ? ?PCP: [name], NP ? ?Admit date: 12/28/2021 ?Discharge date: 12/31/2021 ? ?Admitted From: home ?Disposition:  home ? ?Recommendations for Outpatient Follow-up:  ?Follow up with PCP in 1-2 weeks ?Follow-up with wound care as scheduled ? ?Home Health: RN ?Equipment/Devices: none ? ?Discharge Condition: stable ?CODE STATUS: Full code ?Diet recommendation: regular ? ?HPI: Per admitting MD, ?[name] is a 56 y.o. male with medical history significant for insulin-dependent diabetes mellitus, chronic systolic CHF, chronic pain, renal mass, chronic Foley catheter, sacral ulcer with chronic osteomyelitis, and recent admission with aspiration pneumonia and UTI after an episode of unresponsiveness, now presenting with worsening in his sacral ulcer.  Patient reports that he has had difficulty obtaining wound VAC supplies and has had increasing diameter of his sacral ulcer with surrounding redness, drainage, and has developed chills, nausea, and vomiting.  He denies abdominal pain or diarrhea.  He denies any cough or shortness of breath. ? ?Hospital Course / Discharge diagnoses: ?Principal Problem: ?  Infected ulcer of skin ([place]) ?Active Problems: ?  Type 2 diabetes mellitus with diabetic polyneuropathy, with long-term current use of insulin ([place]) ?  Paroxysmal atrial flutter ([place]) ?  Chronic systolic CHF (congestive heart failure) ([place]) ?  Stage IV pressure ulcer of sacral region [facility]) ?  Renal mass ? ?Principal problem ?Sacral decubitus ulcer, history of sacrococcygeal osteomyelitis-this is chronic, he does have some redness around the ulcer which is likely from irritation and dermatitis from the wound VAC itself.  Wound care consulted, he does not need wound VAC anymore.  General surgery evaluated and do not think he needs debridement.  Plastic surgery also consulted and does not feel like the ulcer is infected, they  agree with stopping the wound VAC.  The note mentions that patient is not interested in flap reconstruction in the future.  I discussed the case with Dr. Linus Salmons with ID, changes on the CT scan likely represent chronic destruction from his prior episode of osteomyelitis and he does not feel like this is an active infection.  He is afebrile, WBC is normal, procalcitonin negative and CRP is normal.  He does not need further antibiotics but ongoing wound care.  He will be discharged home in stable condition. ? ?Active problems ?Insulin-dependent diabetes mellitus-continue home regimen ?Chronic combined CHF-compensated, continue home regimen ?Paroxysmal a flutter-continue amiodarone, Eliquis ?Renal mass-outpatient follow-up with Dr. Louis Meckel ? ?Sepsis ruled out ? ?Discharge Instructions ? ? ?Allergies as of 12/31/2021   ? ?   Reactions  ? Bactrim [sulfamethoxazole-trimethoprim]   ? Ceprotin [protein C Concentrate (human)]   ? Ciprofloxacin Other (See Comments)  ? Kidney function  ? Levaquin [levofloxacin]   ? ?  ? ?  ?Medication List  ?  ? ?STOP taking these medications   ? ?cephALEXin 500 MG capsule ?Commonly known as: KEFLEX ?  ? ?  ? ?TAKE these medications   ? ?amiodarone 200 MG tablet ?Commonly known as: PACERONE ?Take 1 tablet (200 mg total) by mouth daily. ?  ?apixaban 5 MG Tabs tablet ?Commonly known as: ELIQUIS ?Take 1 tablet (5 mg total) by mouth 2 (two) times daily. ?  ?blood glucose meter kit and supplies Kit ?Dispense based on patient and insurance preference. Use up to four times daily as directed. ?  ?finasteride 5 MG tablet ?Commonly known as: PROSCAR ?Take 1 tablet (5 mg total) by mouth daily. ?  ?furosemide 40 MG tablet ?Commonly known as: LASIX ?Take  40 mg by mouth daily. ?  ?gabapentin 400 MG capsule ?Commonly known as: NEURONTIN ?Take 1 capsule (400 mg total) by mouth 3 (three) times daily. ?  ?Insulin Pen Needle 32G X 4 MM Misc ?Use as directed ?  ?insulin regular 100 units/mL injection ?Commonly known  as: NOVOLIN R ?Inject 0-20 Units into the skin 4 (four) times daily as needed for high blood sugar. ?  ?methocarbamol 500 MG tablet ?Commonly known as: ROBAXIN ?Take 1,000 mg by mouth 3 (three) times daily. ?  ?metoprolol succinate 25 MG 24 hr tablet ?Commonly known as: TOPROL-XL ?Take 1 tablet (25 mg total) by mouth daily. ?  ?NovoLIN 70/30 ReliOn (70-30) 100 UNIT/ML injection ?Generic drug: insulin NPH-regular Human ?Inject 15 Units into the skin 2 (two) times daily with a meal. ?  ?oxyCODONE-acetaminophen 5-325 MG tablet ?Commonly known as: PERCOCET/ROXICET ?Take 1 tablet by mouth daily after breakfast for 7 days. ?  ?protein supplement shake Liqd ?Commonly known as: PREMIER PROTEIN ?Take 2 oz by mouth 3 (three) times daily with meals. ?  ?tamsulosin 0.4 MG Caps capsule ?Commonly known as: FLOMAX ?Take 1 capsule (0.4 mg total) by mouth daily. ?  ?testosterone cypionate 200 MG/ML injection ?Commonly known as: DEPOTESTOSTERONE CYPIONATE ?Inject 1 mL (200 mg total) into the muscle every 14 (fourteen) days. ?  ?traZODone 150 MG tablet ?Commonly known as: DESYREL ?TAKE 1 TABLET(150 MG) BY MOUTH AT BEDTIME AS NEEDED FOR SLEEP ?What changed: See the new instructions. ?  ? ?  ? ?  ?  ? ? ?  ?Durable Medical Equipment  ?(From admission, onward)  ?  ? ? ?  ? ?  Start     Ordered  ? 12/30/21 1137  For home use only DME Other see comment  Once       ?Comments: Gel over lay  ?Question:  Length of Need  Answer:  Lifetime  ? 12/30/21 1136  ? 12/30/21 1110  For home use only DME Other see comment  Once       ?Comments: Low air loss mattress  ?Question:  Length of Need  Answer:  Lifetime  ? 12/30/21 1110  ? ?  ?  ? ?  ? ? ?Consultations: ?Plastic surgery ? ?Procedures/Studies: ? ?DG Chest 2 View ? ?Result Date: 12/28/2021 ?CLINICAL DATA:  Weakness EXAM: CHEST - 2 VIEW COMPARISON:  12/15/2021 FINDINGS: Linear areas of atelectasis in the right lung base. Left lung clear. Elevation of the right hemidiaphragm. Heart is normal size. No  effusion or pneumothorax. No acute bony abnormality. IMPRESSION: Elevated right hemidiaphragm with right base atelectasis. Electronically Signed   By: Rolm Baptise M.D.   On: 12/28/2021 21:26  ? ?CT PELVIS W CONTRAST ? ?Result Date: 12/29/2021 ?CLINICAL DATA:  Pelvic osteomyelitis suspected.  CP EXAM: CT PELVIS WITH CONTRAST TECHNIQUE: Multidetector CT imaging of the pelvis was performed using the standard protocol following the bolus administration of intravenous contrast. RADIATION DOSE REDUCTION: This exam was performed according to the departmental dose-optimization program which includes automated exposure control, adjustment of the mA and/or kV according to patient size and/or use of iterative reconstruction technique. CONTRAST:  146m OMNIPAQUE IOHEXOL 300 MG/ML  SOLN COMPARISON:  CT abdomen pelvis with contrast recently 12/15/2021 FINDINGS: Urinary Tract: The distal ureters are clear. The bladder is again noted catheterized and severely thickened, unchanged. Bowel: No dilatation or wall thickening. Scattered uncomplicated sigmoid diverticula. An appendix is not seen in this patient. Vascular/Lymphatic: There is nonstenosing aortoiliac calcific plaque. No lymphadenopathy is  seen. Reproductive: The prostate is not enlarged. Both testicles are in the scrotal sac. There are multiple phleboliths along the spermatic cord veins, both pelvic sidewalls. Other: There are small umbilical and inguinal fat hernias. There is trace posterior deep pelvic ascites which was seen previously. There is no pelvic free air, hemorrhage or abscess. Musculoskeletal: Most notably there is a midline sacral decubitus ulcer overlying S5 and the first coccygeal segment, measuring 3.5 cm craniocaudal, 2.7 cm coronal, and extending a depth of at least 1.2 cm. As seen on 12/15/2021 there is S5 sclerosis and a feathery periosteal reaction with small pitted erosive changes in the outer surface of the bone consistent with osteomyelitis and there  is lytic destructive change of the first and second coccygeal segments consistent with additional osteomyelitis. The remainder of the coccyx is intact. The bony pelvis and remainder of the sacrum,

## 2021-12-31 NOTE — Progress Notes (Signed)
Discharge instructions provided to patient, patient verbalizes understanding. Dressing change instructions given to patient and patient friends. Both verbalize understanding. Supplies given to patient. Patient discharged ?

## 2021-12-31 NOTE — Discharge Instructions (Signed)
Per wound care RN ? ?Silver hydrofiber to absorb excess exudate and for chronic wound; fill with fluff gauze (because of the amount of drainage) ?Top with foam for exudate and to hopefully address periwound maceration  ? ?

## 2021-12-31 NOTE — Progress Notes (Signed)
Inpatient Diabetes Program Recommendations ? ?AACE/ADA: New Consensus Statement on Inpatient Glycemic Control (2015) ? ?Target Ranges:  Prepandial:   less than 140 mg/dL ?     Peak postprandial:   less than 180 mg/dL (1-2 hours) ?     Critically ill patients:  140 - 180 mg/dL  ? ? ? ?Review of Glycemic Control ? Latest Reference Range & Units 12/31/21 08:16 12/31/21 11:10  ?Glucose-Capillary 70 - 99 mg/dL 193 (H) 258 (H)  ?(H): Data is abnormally high ? ?Diabetes history: DM2 ?Outpatient Diabetes medications: 70/30 15 units BID, Regular 0-20 units QID ?Current orders for Inpatient glycemic control: Semglee 24 units QHS, Novolog 0-15 units TID and 0-5 units QHS, Novolog 3 units TID  ? ?Inpatient Diabetes Program Recommendations:   ? ?Novolog 5 units TID ? ?Will continue to follow while inpatient. ? ?Thank you, ?Reche Dixon, MSN, RN ?Diabetes Coordinator ?Inpatient Diabetes Program ?[phone] (team pager from 8a-5p) ? ? ? ?

## 2021-12-31 NOTE — TOC Progression Note (Addendum)
Transition of Care (TOC) - Progression Note  ? ? ?Patient Details  ?Name: Robertlee Rogacki ?MRN: [id number] ?Date of Birth: 20-Nov-1965 ? ?Transition of Care (TOC) CM/SW Contact  ?Marilu Favre, RN ?Phone Number: ?12/31/2021, 11:23 AM ? ?Clinical Narrative:    ? ? ?Jenn with [facility] aware discharge is today. Nurse to provide wound care education to patient and friend.  ? ?Spoke to patient at bedside this morning. Home mattress per patient to be delivered tomorrow. Discussed [place] [facility] appointment for today  ?Expected Discharge Plan: [place] ?Barriers to Discharge: Continued Medical Work up ? ?Expected Discharge Plan and Services ?Expected Discharge Plan: [place] ?  ?Discharge Planning Services: CM Consult ?Post Acute Care Choice: Home Health ?Living arrangements for the past 2 months: Apartment ?Expected Discharge Date: 12/31/21               ?DME Arranged: Other see comment ?DME Agency: [employer] ?Date DME Agency Contacted: 12/30/21 ?Time DME Agency Contacted: [phone] ?Representative spoke with at DME Agency: Delana Meyer ?HH Arranged: RN ?[place] Agency: [facility] ?Date HH Agency Contacted: 12/30/21 ?Time [place]: 5701 ?Representative spoke with at [place]: Anderson Malta ? ? ?Social Determinants of Health (SDOH) Interventions ?  ? ?Readmission Risk Interventions ?   ? View : No data to display.  ?  ?  ?  ? ? ?

## 2022-01-01 ENCOUNTER — Telehealth: Payer: Self-pay

## 2022-01-01 LAB — URINE CULTURE: Culture: 80000 — AB

## 2022-01-01 NOTE — Telephone Encounter (Signed)
States patient just got out of [facility] patients wound is measuring larger than it did on the 17th.    Measurements:  length x width x depth 17th:  2 x 2.5 x 1.5 27th:  3 x 2.5 x 1.5  Worsening undermining and tunneling on the 27th.   States order sent home from hospital will not address undermining and tunneling.  States patient needs [employer] wound vac ASAP.   States new patient appt with wound care was scheduled for 4/26 but due to patient being in the hospital that was moved to  5/4.  States can not get wound care order from wound care until that appointment date.   State she will also have a [employer] rep follow up with the office as well.    States she wants to transition from orders sent for wound care from hospital, yesterday to order for wound care prior to hospital stay by [name].

## 2022-01-01 NOTE — Telephone Encounter (Signed)
Patient nurse Hinton Dyer called about wound declining requesting supplies for wound vac. Advise Hinton Dyer per provider she has never seen the wound or put in a order for wound vac she is unable to order supplies patient does have a appointment with the wound center ?

## 2022-01-01 NOTE — Telephone Encounter (Signed)
Transition Care Management Follow-up Telephone Call ?Date of discharge and from where: [facility] 12/31/21 ?How have you been since you were released from the hospital? Not good  ?Any questions or concerns? Yes will address with wound care  ? ?Items Reviewed: ?Did the pt receive and understand the discharge instructions provided? Yes  ?Medications obtained and verified? Yes  ?Other? No  ?Any new allergies since your discharge? No  ?Dietary orders reviewed? Yes ?Do you have support at home? Yes  ? ?Home Care and Equipment/Supplies: ?Were home health services ordered? yes ?If so, what is the name of the agency? [facility]  RN coming out 01/01/22 ?Has the agency set up a time to come to the patient's home? Yes 01/01/22 ?Were any new equipment or medical supplies ordered?  No ?What is the name of the medical supply agency?  ?Were you able to get the supplies/equipment? not applicable ?Do you have any questions related to the use of the equipment or supplies?  ? ?Functional Questionnaire: (I = Independent and D = Dependent) ?ADLs: I ? ?Bathing/Dressing- I ? ?Meal Prep- I ? ?Eating- I ? ?Maintaining continence- I ? ?Transferring/Ambulation- I with assist  ? ?Managing Meds- I ? ?Follow up appointments reviewed: ? ?PCP Hospital f/u appt confirmed? No  Pt stated he will follow up with wound care  ?[facility] f/u appt confirmed? Yes  Scheduled to see wound care  on 01/08/22 @ 9:00. ?Are transportation arrangements needed? No  ?If their condition worsens, is the pt aware to call PCP or go to the Emergency Dept.? Yes ?Was the patient provided with contact information for the PCP's office or ED? Yes ?Was to pt encouraged to call back with questions or concerns? Yes ? ?

## 2022-01-02 ENCOUNTER — Telehealth: Payer: Self-pay

## 2022-01-02 ENCOUNTER — Other Ambulatory Visit: Payer: Self-pay | Admitting: Nurse Practitioner

## 2022-01-02 DIAGNOSIS — F5101 Primary insomnia: Secondary | ICD-10-CM

## 2022-01-02 NOTE — Telephone Encounter (Signed)
error 

## 2022-01-03 DIAGNOSIS — Z794 Long term (current) use of insulin: Secondary | ICD-10-CM | POA: Diagnosis not present

## 2022-01-03 DIAGNOSIS — E11622 Type 2 diabetes mellitus with other skin ulcer: Secondary | ICD-10-CM | POA: Diagnosis not present

## 2022-01-03 DIAGNOSIS — Z89511 Acquired absence of right leg below knee: Secondary | ICD-10-CM | POA: Diagnosis not present

## 2022-01-03 DIAGNOSIS — R112 Nausea with vomiting, unspecified: Secondary | ICD-10-CM | POA: Diagnosis not present

## 2022-01-03 DIAGNOSIS — L89159 Pressure ulcer of sacral region, unspecified stage: Secondary | ICD-10-CM | POA: Diagnosis not present

## 2022-01-03 DIAGNOSIS — N281 Cyst of kidney, acquired: Secondary | ICD-10-CM | POA: Diagnosis not present

## 2022-01-03 DIAGNOSIS — Z79899 Other long term (current) drug therapy: Secondary | ICD-10-CM | POA: Diagnosis not present

## 2022-01-03 DIAGNOSIS — Z5181 Encounter for therapeutic drug level monitoring: Secondary | ICD-10-CM | POA: Diagnosis not present

## 2022-01-03 DIAGNOSIS — L98429 Non-pressure chronic ulcer of back with unspecified severity: Secondary | ICD-10-CM | POA: Diagnosis not present

## 2022-01-03 DIAGNOSIS — Z89512 Acquired absence of left leg below knee: Secondary | ICD-10-CM | POA: Diagnosis not present

## 2022-01-03 DIAGNOSIS — R509 Fever, unspecified: Secondary | ICD-10-CM | POA: Diagnosis not present

## 2022-01-03 DIAGNOSIS — E1165 Type 2 diabetes mellitus with hyperglycemia: Secondary | ICD-10-CM | POA: Diagnosis not present

## 2022-01-03 LAB — CULTURE, BLOOD (ROUTINE X 2)
Culture: NO GROWTH
Culture: NO GROWTH
Special Requests: ADEQUATE

## 2022-01-05 NOTE — Telephone Encounter (Signed)
Please call Dawn back regarding getting pt's "wound care set up." ?

## 2022-01-06 NOTE — Telephone Encounter (Signed)
Paperwork faxed °

## 2022-01-08 ENCOUNTER — Encounter (HOSPITAL_BASED_OUTPATIENT_CLINIC_OR_DEPARTMENT_OTHER): Payer: Medicare HMO | Attending: General Surgery | Admitting: General Surgery

## 2022-01-08 DIAGNOSIS — E119 Type 2 diabetes mellitus without complications: Secondary | ICD-10-CM | POA: Diagnosis not present

## 2022-01-08 DIAGNOSIS — Z89511 Acquired absence of right leg below knee: Secondary | ICD-10-CM | POA: Insufficient documentation

## 2022-01-08 DIAGNOSIS — L89154 Pressure ulcer of sacral region, stage 4: Secondary | ICD-10-CM | POA: Diagnosis not present

## 2022-01-08 DIAGNOSIS — Z89512 Acquired absence of left leg below knee: Secondary | ICD-10-CM | POA: Diagnosis not present

## 2022-01-08 DIAGNOSIS — I10 Essential (primary) hypertension: Secondary | ICD-10-CM | POA: Diagnosis not present

## 2022-01-08 NOTE — Progress Notes (Signed)
TRA, WILEMON ([id number]) ?Visit Report for 01/08/2022 ?Abuse Risk Screen Details ?Patient Name: Date of Service: ?[name], [name] 01/08/2022 9:00 A M ?Medical Record Number: [id number] ?Patient Account Number: [id number] ?Date of Birth/Sex: Treating RN: ?1966/05/25 (56 y.o. [name] ?Primary Care Vayda Dungee: [name] Other Clinician: ?Referring Ellawyn Wogan: ?Treating Cylah Fannin/Extender: [name] ?[name] ?Weeks in Treatment: 0 ?Abuse Risk Screen Items ?Answer ?ABUSE RISK SCREEN: ?Has anyone close to you tried to hurt or harm you recentlyo No ?Do you feel uncomfortable with anyone in your familyo No ?Has anyone forced you do things that you didnt want to doo No ?Electronic Signature(s) ?Signed: 01/08/2022 6:07:39 PM By: Baruch Gouty RN, BSN ?Entered By: Baruch Gouty on 01/08/2022 09:44:14 ?-------------------------------------------------------------------------------- ?Activities of Daily Living Details ?Patient Name: Date of Service: ?[name], [name] 01/08/2022 9:00 A M ?Medical Record Number: [id number] ?Patient Account Number: [id number] ?Date of Birth/Sex: Treating RN: ?03/23/1966 (56 y.o. [name] ?Primary Care Porchea Charrier: [name] Other Clinician: ?Referring Sharlize Hoar: ?Treating Kalvin Buss/Extender: [name] ?[name] ?Weeks in Treatment: 0 ?Activities of Daily Living Items ?Answer ?Activities of Daily Living (Please select one for each item) ?Drive Automobile Not Able ?T Medications ?ake Completely Able ?Use T elephone Completely Able ?Care for Appearance Need Assistance ?Use T oilet Need Assistance ?Bath / Shower Need Assistance ?Dress Self Need Assistance ?Feed Self Completely Able ?Walk Not Able ?Get In / Out Bed Need Assistance ?Housework Need Assistance ?Prepare Meals Need Assistance ?Handle Money Completely Able ?Shop for Self Need Assistance ?Electronic Signature(s) ?Signed: 01/08/2022 6:07:39 PM By: Baruch Gouty RN, BSN ?Entered By:  Baruch Gouty on 01/08/2022 09:45:36 ?-------------------------------------------------------------------------------- ?Education Screening Details ?Patient Name: ?Date of Service: ?[name], [name] 01/08/2022 9:00 A M ?Medical Record Number: [id number] ?Patient Account Number: [id number] ?Date of Birth/Sex: ?Treating RN: ?June 17, 1966 (56 y.o. [name] ?Primary Care Hutchinson Isenberg: [name] ?Other Clinician: ?Referring Atthew Coutant: ?Treating Kennedy Bohanon/Extender: [name] ?[name] ?Weeks in Treatment: 0 ?Primary Learner Assessed: Patient ?Learning Preferences/Education Level/Primary Language ?Learning Preference: Explanation, Demonstration, Printed Material ?Highest Education Level: College or Above ?Preferred Language: English ?Cognitive Barrier ?Language Barrier: No ?Translator Needed: No ?Memory Deficit: No ?Emotional Barrier: No ?Cultural/Religious Beliefs Affecting Medical Care: No ?Physical Barrier ?Impaired Vision: Yes Glasses ?Impaired Hearing: No ?Decreased Hand dexterity: No ?Knowledge/Comprehension ?Knowledge Level: High ?Comprehension Level: High ?Ability to understand written instructions: High ?Ability to understand verbal instructions: High ?Motivation ?Anxiety Level: Calm ?Cooperation: Cooperative ?Education Importance: Acknowledges Need ?Interest in Health Problems: Asks Questions ?Perception: Coherent ?Willingness to Engage in Self-Management High ?Activities: ?Readiness to Engage in Self-Management High ?Activities: ?Electronic Signature(s) ?Signed: 01/08/2022 6:07:39 PM By: Baruch Gouty RN, BSN ?Entered By: Baruch Gouty on 01/08/2022 09:47:15 ?-------------------------------------------------------------------------------- ?Fall Risk Assessment Details ?Patient Name: ?Date of Service: ?[name], [name] 01/08/2022 9:00 A M ?Medical Record Number: [id number] ?Patient Account Number: [id number] ?Date of Birth/Sex: ?Treating RN: ?10/11/1965 (56 y.o. [name] ?Primary  Care Kato Wieczorek: [name] ?Other Clinician: ?Referring Oveda Dadamo: ?Treating Sylvester Salonga/Extender: [name] ?[name] ?Weeks in Treatment: 0 ?Fall Risk Assessment Items ?Have you had 2 or more falls in the last 12 monthso 0 No ?Have you had any fall that resulted in injury in the last 12 monthso 0 No ?FALLS RISK SCREEN ?History of falling - immediate or within 3 months 0 No ?Secondary diagnosis (Do you have 2 or more medical diagnoseso) 0 No ?Ambulatory aid ?None/bed rest/wheelchair/nurse 0 Yes ?Crutches/cane/walker 0 No ?Furniture 0 No ?Intravenous therapy Access/Saline/Heparin Lock 0 No ?Gait/Transferring ?Normal/ bed rest/ wheelchair 0  Yes ?Weak (short steps with or without shuffle, stooped but able to lift head while walking, may seek 0 No ?support from furniture) ?Impaired (short steps with shuffle, may have difficulty arising from chair, head down, impaired 0 No ?balance) ?Mental Status ?Oriented to own ability 0 Yes ?Electronic Signature(s) ?Signed: 01/08/2022 6:07:39 PM By: Baruch Gouty RN, BSN ?Entered By: Baruch Gouty on 01/08/2022 09:47:31 ?-------------------------------------------------------------------------------- ?Foot Assessment Details ?Patient Name: ?Date of Service: ?[name], [name] 01/08/2022 9:00 A M ?Medical Record Number: [id number] ?Patient Account Number: [id number] ?Date of Birth/Sex: ?Treating RN: ?1966/03/21 (56 y.o. [name] ?Primary Care Zyren Sevigny: [name] ?Other Clinician: ?Referring Ram Haugan: ?Treating Kryssa Risenhoover/Extender: [name] ?[name] ?Weeks in Treatment: 0 ?Foot Assessment Items ?'[x]'$  Unable to perform right foot assessment due to amputation ?'[x]'$  Unable to perform left foot assessment due to amputation ?Site Locations ?+ = Sensation present, - = Sensation absent, C = Callus, U = Ulcer ?R = Redness, W = Warmth, M = Maceration, PU = Pre-ulcerative lesion ?F = Fissure, S = Swelling, D = Dryness ?Assessment ?Right:  Left: ?Other Deformity: ?Prior Foot Ulcer: ?Prior Amputation: ?Charcot Joint: ?Ambulatory Status: Non-ambulatory ?Assistance Device: Wheelchair ?Gait: ?Electronic Signature(s) ?Signed: 01/08/2022 6:07:39 PM By: Baruch Gouty RN, BSN ?Entered By: Baruch Gouty on 01/08/2022 09:48:23 ?-------------------------------------------------------------------------------- ?Nutrition Risk Screening Details ?Patient Name: ?Date of Service: ?[name], [name] 01/08/2022 9:00 A M ?Medical Record Number: [id number] ?Patient Account Number: [id number] ?Date of Birth/Sex: ?Treating RN: ?[birth year] (56 y.o. [name] ?Primary Care Bladyn Tipps: [name] ?Other Clinician: ?Referring Seda Kronberg: ?Treating Jurnie Garritano/Extender: [name] ?[name] ?Weeks in Treatment: 0 ?Height (in): ?Weight (lbs): ?Body Mass Index (BMI): ?Nutrition Risk Screening Items ?Score Screening ?NUTRITION RISK SCREEN: ?I have an illness or condition that made me change the kind and/or amount of food I eat 0 No ?I eat fewer than two meals per day 0 No ?I eat few fruits and vegetables, or milk products 0 No ?I have three or more drinks of beer, liquor or wine almost every day 0 No ?I have tooth or mouth problems that make it hard for me to eat 0 No ?I don't always have enough money to buy the food I need 0 No ?I eat alone most of the time 0 No ?I take three or more different prescribed or over-the-counter drugs a day 1 Yes ?Without wanting to, I have lost or gained 10 pounds in the last six months 2 Yes ?I am not always physically able to shop, cook and/or feed myself 0 No ?Nutrition Protocols ?Good Risk Protocol ?Provide education on elevated blood ?Moderate Risk Protocol 0 sugars and impact on wound healing, ?as applicable ?High Risk Proctocol ?Risk Level: Moderate Risk ?Score: 3 ?Electronic Signature(s) ?Signed: 01/08/2022 6:07:39 PM By: Baruch Gouty RN, BSN ?Entered By: Baruch Gouty on 01/08/2022 09:48:08 ?

## 2022-01-08 NOTE — Progress Notes (Signed)
CAEDON, BOND ([id number]) ?Visit Report for 01/08/2022 ?Allergy List Details ?Patient Name: Date of Service: ?[name], [name] 01/08/2022 9:00 A M ?Medical Record Number: [id number] ?Patient Account Number: [id number] ?Date of Birth/Sex: Treating RN: ?16-Jul-1966 (56 y.o. [name] ?Primary Care Olivene Cookston: [name] Other Clinician: ?Referring Paelyn Smick: ?Treating Ozro Russett/Extender: [name] ?[name] ?Weeks in Treatment: 0 ?Allergies ?Active Allergies ?Bactrim ?Reaction: kidney damage ?Ceprotin Nurse, mental health) ?Reaction: unknown ?ciprofloxacin ?Levaquin ?Allergy Notes ?Electronic Signature(s) ?Signed: 01/08/2022 6:07:39 PM By: Baruch Gouty RN, BSN ?Entered By: Baruch Gouty on 01/08/2022 09:34:46 ?-------------------------------------------------------------------------------- ?Arrival Information Details ?Patient Name: Date of Service: ?[name], [name] 01/08/2022 9:00 A M ?Medical Record Number: [id number] ?Patient Account Number: [id number] ?Date of Birth/Sex: Treating RN: ?[birth year] (56 y.o. [name] ?Primary Care Madisen Ludvigsen: [name] Other Clinician: ?Referring Candida Vetter: ?Treating Deniesha Stenglein/Extender: [name] ?[name] ?Weeks in Treatment: 0 ?Visit Information ?Patient Arrived: Wheel Chair ?Arrival Time: 09:26 ?Accompanied By: friend ?Transfer Assistance: Transfer Board ?Patient Identification Verified: Yes ?Secondary Verification Process Completed: Yes ?Patient Requires Transmission-Based Precautions: No ?Patient Has Alerts: No ?Electronic Signature(s) ?Signed: 01/08/2022 6:07:39 PM By: Baruch Gouty RN, BSN ?Entered By: Baruch Gouty on 01/08/2022 09:29:54 ?-------------------------------------------------------------------------------- ?Clinic Level of Care Assessment Details ?Patient Name: Date of Service: ?[name], [name] 01/08/2022 9:00 A M ?Medical Record Number: [id number] ?Patient Account Number: [id number] ?Date of Birth/Sex: Treating  RN: ?1966/08/02 (56 y.o. [name] ?Primary Care Catina Nuss: [name] Other Clinician: ?Referring Finnbar Cedillos: ?Treating Hayzel Ruberg/Extender: [name] ?[name] ?Weeks in Treatment: 0 ?Clinic Level of Care Assessment Items ?TOOL 1 Quantity Score ?'[]'$  - 0 ?Use when EandM and Procedure is performed on INITIAL visit ?ASSESSMENTS - Nursing Assessment / Reassessment ?X- 1 20 ?General Physical Exam (combine w/ comprehensive assessment (listed just below) when performed on new pt. evals) ?X- 1 25 ?Comprehensive Assessment (HX, ROS, Risk Assessments, Wounds Hx, etc.) ?ASSESSMENTS - Wound and Skin Assessment / Reassessment ?'[]'$  - 0 ?Dermatologic / Skin Assessment (not related to wound area) ?ASSESSMENTS - Ostomy and/or Continence Assessment and Care ?'[]'$  - 0 ?Incontinence Assessment and Management ?'[]'$  - 0 ?Ostomy Care Assessment and Management (repouching, etc.) ?PROCESS - Coordination of Care ?X - Simple Patient / Family Education for ongoing care 1 15 ?'[]'$  - 0 ?Complex (extensive) Patient / Family Education for ongoing care ?X- 1 10 ?Staff obtains Consents, Records, T Results / Process Orders ?est ?X- 1 10 ?Staff telephones HHA, Nursing Homes / Clarify orders / etc ?'[]'$  - 0 ?Routine Transfer to another Facility (non-emergent condition) ?'[]'$  - 0 ?Routine Hospital Admission (non-emergent condition) ?X- 1 15 ?New Admissions / Biomedical engineer / Ordering NPWT Apligraf, etc. ?, ?'[]'$  - 0 ?Emergency Hospital Admission (emergent condition) ?PROCESS - Special Needs ?'[]'$  - 0 ?Pediatric / Minor Patient Management ?'[]'$  - 0 ?Isolation Patient Management ?'[]'$  - 0 ?Hearing / Language / Visual special needs ?'[]'$  - 0 ?Assessment of Community assistance (transportation, D/C planning, etc.) ?'[]'$  - 0 ?Additional assistance / Altered mentation ?'[]'$  - 0 ?Support Surface(s) Assessment (bed, cushion, seat, etc.) ?INTERVENTIONS - Miscellaneous ?'[]'$  - 0 ?External ear exam ?'[]'$  - 0 ?Patient Transfer (multiple staff / Civil Service fast streamer  / Similar devices) ?'[]'$  - 0 ?Simple Staple / Suture removal (25 or less) ?'[]'$  - 0 ?Complex Staple / Suture removal (26 or more) ?'[]'$  - 0 ?Hypo/Hyperglycemic Management (do not check if billed separately) ?'[]'$  - 0 ?Ankle / Brachial Index (ABI) - do not check if billed separately ?Has the patient been seen at the hospital within the last  three years: Yes ?Total Score: 95 ?Level Of Care: New/Established - Level 3 ?Electronic Signature(s) ?Signed: 01/08/2022 6:07:39 PM By: Baruch Gouty RN, BSN ?Signed: 01/08/2022 6:07:39 PM By: Baruch Gouty RN, BSN ?Entered By: Baruch Gouty on 01/08/2022 10:21:34 ?-------------------------------------------------------------------------------- ?Encounter Discharge Information Details ?Patient Name: ?Date of Service: ?[name], [name] 01/08/2022 9:00 A M ?Medical Record Number: [id number] ?Patient Account Number: [id number] ?Date of Birth/Sex: ?Treating RN: ?1965/11/05 (56 y.o. [name] ?Primary Care Sewell Pitner: [name] ?Other Clinician: ?Referring Mckinnley Smithey: ?Treating Orlyn Odonoghue/Extender: [name] ?[name] ?Weeks in Treatment: 0 ?Encounter Discharge Information Items Post Procedure Vitals ?Discharge Condition: Stable ?Temperature (F): 97.6 ?Ambulatory Status: Wheelchair ?Pulse (bpm): 81 ?Discharge Destination: Home ?Respiratory Rate (breaths/min): 18 ?Transportation: Private Auto ?Blood Pressure (mmHg): 178/77 ?Accompanied By: friend ?Schedule Follow-up Appointment: Yes ?Clinical Summary of Care: Patient Declined ?Electronic Signature(s) ?Signed: 01/08/2022 6:07:39 PM By: Baruch Gouty RN, BSN ?Entered By: Baruch Gouty on 01/08/2022 10:41:50 ?-------------------------------------------------------------------------------- ?Lower Extremity Assessment Details ?Patient Name: ?Date of Service: ?[name], [name] 01/08/2022 9:00 A M ?Medical Record Number: [id number] ?Patient Account Number: [id number] ?Date of Birth/Sex: ?Treating RN: ?02/24/1966 (56 y.o. [name] ?Primary Care Skylene Deremer: [name] ?Other Clinician: ?Referring Nettye Flegal: ?Treating Sena Hoopingarner/Extender: [name] ?[name] ?Weeks in Treatment: 0 ?Electronic Signature(s) ?Signed: 01/08/2022 6:07:39 PM By: Baruch Gouty RN, BSN ?Entered By: Baruch Gouty on 01/08/2022 09:48:29 ?-------------------------------------------------------------------------------- ?Multi Wound Chart Details ?Patient Name: ?Date of Service: ?[name], [name] 01/08/2022 9:00 A M ?Medical Record Number: [id number] ?Patient Account Number: [id number] ?Date of Birth/Sex: ?Treating RN: ?[birth year] (56 y.o. [name] ?Primary Care Damian Buckles: [name] ?Other Clinician: ?Referring Tarquin Welcher: ?Treating Sheralee Qazi/Extender: [name] ?[name] ?Weeks in Treatment: 0 ?Vital Signs ?Height(in): ?Pulse(bpm): 81 ?Weight(lbs): ?Blood Pressure(mmHg): 178/77 ?Body Mass Index(BMI): ?Temperature(??F): 97.6 ?Respiratory Rate(breaths/min): 18 ?Photos: [N/A:N/A] ?Sacrum N/A N/A ?Wound Location: ?Pressure Injury N/A N/A ?Wounding Event: ?Pressure Ulcer N/A N/A ?Primary Etiology: ?Congestive Heart Failure, N/A N/A ?Comorbid History: ?Hypertension, Type II Diabetes, ?Osteomyelitis, Confinement Anxiety ?07/29/2021 N/A N/A ?Date Acquired: ?0 N/A N/A ?Weeks of Treatment: ?Open N/A N/A ?Wound Status: ?No N/A N/A ?Wound Recurrence: ?3x2.5x1.1 N/A N/A ?Measurements L x W x D (cm) ?5.89 N/A N/A ?A (cm?) : ?rea ?6.48 N/A N/A ?Volume (cm?) : ?0.00% N/A N/A ?% Reduction in A rea: ?0.00% N/A N/A ?% Reduction in Volume: ?12 ?Starting Position 1 (o'clock): ?4 ?Ending Position 1 (o'clock): ?2.3 ?Maximum Distance 1 (cm): ?Yes N/A N/A ?Undermining: ?Category/Stage IV N/A N/A ?Classification: ?Medium N/A N/A ?Exudate A mount: ?Serous N/A N/A ?Exudate Type: ?amber N/A N/A ?Exudate Color: ?Well defined, not attached N/A N/A ?Wound Margin: ?Large (67-100%) N/A N/A ?Granulation A mount: ?Red, Pale N/A  N/A ?Granulation Quality: ?Small (1-33%) N/A N/A ?Necrotic A mount: ?Fat Layer (Subcutaneous Tissue): Yes N/A N/A ?Exposed Structures: ?Muscle: Yes ?Fascia: No ?Tendon: No ?Joint: No ?Bone: No ?Small (1-33%) N/A N/A ?Epithe

## 2022-01-08 NOTE — Progress Notes (Signed)
IFEOLUWA, BELLER ([id number]) ?Visit Report for 01/08/2022 ?Chief Complaint Document Details ?Patient Name: Date of Service: ?[name], [name] 01/08/2022 9:00 A M ?Medical Record Number: [id number] ?Patient Account Number: [id number] ?Date of Birth/Sex: Treating RN: ?February 17, 1966 (56 y.o. [name] ?Primary Care Provider: [name] Other Clinician: ?Referring Provider: ?Treating Provider/Extender: [name] ?[name] ?Weeks in Treatment: 0 ?Information Obtained from: Patient ?Chief Complaint ?Patient is at the clinic for treatment of an open pressure ulcer ?Electronic Signature(s) ?Signed: 01/08/2022 10:38:29 AM By: [name] MD FACS ?Entered By: [name] on 01/08/2022 10:38:29 ?-------------------------------------------------------------------------------- ?Debridement Details ?Patient Name: Date of Service: ?[name], [name] 01/08/2022 9:00 A M ?Medical Record Number: [id number] ?Patient Account Number: [id number] ?Date of Birth/Sex: Treating RN: ?07/02/1966 (56 y.o. [name] ?Primary Care Provider: [name] Other Clinician: ?Referring Provider: ?Treating Provider/Extender: [name] ?[name] ?Weeks in Treatment: 0 ?Debridement Performed for Assessment: Wound #1 Sacrum ?Performed By: Physician [name], MD ?Debridement Type: Debridement ?Level of Consciousness (Pre-procedure): Awake and Alert ?Pre-procedure Verification/Time Out Yes - 10:15 ?Taken: ?Start Time: 10:16 ?Pain Control: Lidocaine 4% T opical Solution ?T Area Debrided (L x W): ?otal 3 (cm) x 2.5 (cm) = 7.5 (cm?) ?Tissue and other material debrided: Viable, Non-Viable, Muscle, Slough, Subcutaneous, Slough ?Level: Skin/Subcutaneous Tissue/Muscle ?Debridement Description: Excisional ?Instrument: Curette ?Bleeding: Minimum ?Hemostasis Achieved: Pressure ?Procedural Pain: 4 ?Post Procedural Pain: 3 ?Response to Treatment: Procedure was tolerated well ?Level of Consciousness  (Post- Awake and Alert ?procedure): ?Post Debridement Measurements of Total Wound ?Length: (cm) 3 ?Stage: Category/Stage IV ?Width: (cm) 2.5 ?Depth: (cm) 1.1 ?Volume: (cm?) 6.48 ?Character of Wound/Ulcer Post Debridement: Improved ?Post Procedure Diagnosis ?Same as Pre-procedure ?Electronic Signature(s) ?Signed: 01/08/2022 11:46:31 AM By: [name] MD FACS ?Signed: 01/08/2022 6:07:39 PM By: Baruch Gouty RN, BSN ?Entered By: Baruch Gouty on 01/08/2022 10:23:51 ?-------------------------------------------------------------------------------- ?HPI Details ?Patient Name: Date of Service: ?[name], [name] 01/08/2022 9:00 A M ?Medical Record Number: [id number] ?Patient Account Number: [id number] ?Date of Birth/Sex: Treating RN: ?09-Sep-1965 (56 y.o. [name] ?Primary Care Provider: [name] Other Clinician: ?Referring Provider: ?Treating Provider/Extender: [name] ?[name] ?Weeks in Treatment: 0 ?History of Present Illness ?HPI Description: ADMISSION ?01/08/2022 ?This is a 56 year old male with a past medical history notable for type 2 diabetes mellitus (last A1c was 8.6) congestive heart failure, hypertension, chronic ?pain, and bilateral below-knee amputations. His most recent amputation was in November 2022. While he was in the hospital, he developed a sacral pressure ?ulcer. He was subsequently in a skilled nursing facility for some time. He was discharged with home health and had been in a wound VAC, but was then ?admitted to the hospital last week when the wound appeared to be worsening. Apparently the periwound skin was macerated and the device that he had been ?using was leaking quite a bit. Evaluation while in the hospital included a consultation with infectious disease who did not think he had any evidence of ?osteomyelitis, plastic surgery who felt that he was not an appropriate flap candidate (their note also states that he is not interested in a flap) and wound  care ?who took him out of the wound VAC and initiated wet-to-dry dressing changes. He has a new wound VAC from [facility] on order, anticipated delivery today. ?The wound itself is fairly small and isolated to the sacrum. There is muscle exposed. No bone is appreciated but it is palpable beneath the surface. The muscle ?itself is bit pale and there is heaped up epiboly around the  wound edges. No significant odor or drainage. ?Electronic Signature(s) ?Signed: 01/08/2022 10:42:24 AM By: [name] MD FACS ?Entered By: [name] on 01/08/2022 10:42:24 ?-------------------------------------------------------------------------------- ?Physical Exam Details ?Patient Name: Date of Service: ?[name], [name] 01/08/2022 9:00 A M ?Medical Record Number: [id number] ?Patient Account Number: [id number] ?Date of Birth/Sex: Treating RN: ?[birth year] (56 y.o. [name] ?Primary Care Provider: [name] Other Clinician: ?Referring Provider: ?Treating Provider/Extender: [name] ?[name] ?Weeks in Treatment: 0 ?Constitutional ?He is hypertensive.. . . . He appears visibly uncomfortable, but is in no acute distress.Marland Kitchen ?Respiratory ?Normal work of breathing on room air.Marland Kitchen ?Notes ?01/08/2022: On the patient sacrum there is a round ulcer that has exposed muscle. There is undermining at the cranial aspect. There is some accumulation of ?slough on the wound surfaces and some of the muscle tissue is quite pale. There is heaped up epibole around the borders. No significant odor or drainage. The ?periwound skin looks relatively intact but does show signs of having been previously a bit macerated. ?Electronic Signature(s) ?Signed: 01/08/2022 10:48:41 AM By: [name] MD FACS ?Entered By: [name] on 01/08/2022 10:48:40 ?-------------------------------------------------------------------------------- ?Physician Orders Details ?Patient Name: ?Date of Service: ?[name], [name] 01/08/2022 9:00 A  M ?Medical Record Number: [id number] ?Patient Account Number: [id number] ?Date of Birth/Sex: ?Treating RN: ?07/02/1966 (56 y.o. [name] ?Primary Care Provider: [name] ?Other Clinician: ?Referring Provider: ?Treating Provider/Extender: [name] ?[name] ?Weeks in Treatment: 0 ?Verbal / Phone Orders: No ?Diagnosis Coding ?ICD-10 Coding ?Code Description ?L89.154 Pressure ulcer of sacral region, stage 4 ?L89.373 Acquired absence of left leg below knee ?Z89.511 Acquired absence of right leg below knee ?Follow-up Appointments ?ppointment in 1 week. - Dr. Lucienne Capers with [facility] ?Return A ?Wed. 5/10 @ 1230 pm ?Bathing/ Shower/ Hygiene ?May shower with protection but do not get wound dressing(s) wet. ?Negative Presssure Wound Therapy ?Wound #1 Sacrum ?Wound Vac to wound continuously at 174m/hg pressure - start VAC when available, to be changed 3 times per week ?Black Foam ?Off-Loading ?Gel mattress overlay (Group 1) ?Turn and reposition every 2 hours ?Home Health ?Dressing changes to be completed by Home Health on Monday / Wednesday / Friday except when patient has scheduled visit at Wound [facility]. ?Other Home Health Orders/Instructions: - [facility] ?Wound Treatment ?Wound #1 - Sacrum ?Peri-Wound Care: Zinc Oxide Ointment 30g tube 3 x Per Week/30 Days ?Discharge Instructions: Apply Zinc Oxide to periwound with each dressing change ?Prim Dressing: KerraCel Ag Gelling Fiber Dressing, 2x2 in (silver alginate) 3 x Per Week/30 Days ?ary ?Discharge Instructions: Apply silver alginate to wound bed, be sure to tuck into undermining ?Secondary Dressing: ALLEVYN Gentle Border, 5x5 (in/in) 3 x Per Week/30 Days ?Discharge Instructions: Apply over primary dressing as directed. ?Patient Medications ?llergies: Bactrim, Ceprotin (Blue Bar), ciprofloxacin, Levaquin ?A ?Notifications Medication Indication Start End ?prior to debridement 01/08/2022 ?lidocaine ?DOSE topical 4 % cream - cream  topical ?Electronic Signature(s) ?Signed: 01/08/2022 11:46:31 AM By: CFredirick MaudlinMD FACS ?Entered By: CFredirick Maudlinon 01/08/2022 10:48:57 ?--------------------------------------------------------------------------------

## 2022-01-12 ENCOUNTER — Telehealth: Payer: Self-pay

## 2022-01-12 NOTE — Chronic Care Management (AMB) (Signed)
?  Care Management  ? ?Note ? ?01/12/2022 ?Name: [name] MRN: [id number] DOB: [birth year] ? ?[name] is a 56 y.o. year old male who is a primary care patient of [name], NP. I reached out to [name] by phone today offer care coordination services.  ? ?[name] was given information about care management services today including:  ?Care management services include personalized support from designated clinical staff supervised by his physician, including individualized plan of care and coordination with other care providers ?24/7 contact phone numbers for assistance for urgent and routine care needs. ?The patient may stop care management services at any time by phone call to the office staff. ? ?Patient agreed to services and verbal consent obtained.  ? ?Follow up plan: ?Telephone appointment with care management team member scheduled for:01/13/2022 ? ?Noreene Larsson, RMA ?Care Guide, Embedded Care Coordination ?[facility]  ?[place], [place] [address] ?Direct Dial: [phone] ?Museum/[email or web address] ?Website: [email or web address]  ? ?

## 2022-01-13 ENCOUNTER — Ambulatory Visit: Payer: Medicare HMO | Admitting: *Deleted

## 2022-01-13 DIAGNOSIS — L89154 Pressure ulcer of sacral region, stage 4: Secondary | ICD-10-CM

## 2022-01-13 NOTE — Chronic Care Management (AMB) (Signed)
?  Care Management  ? ?Outreach Note ? ?01/13/2022 ?Name: [name] MRN: [id number] DOB: 1966-03-27 ? ?Referred by: [name], NP ?Reason for referral : Care Coordination ? ? ?Successful telephone outreach to patient.   RNCM introduced self and some roles of CCM team.  Patient immediately declines any community/Care Guide needs.  Was unable to explain other roles in program before patient began to express his needs.  Patient very upset about his current condition with sacral wound.  Admits to being very angry, depressed and anxious; requesting I ask PCP for Xanax prescription.  Offered CC/M Social Work referral, Patient declines at this time.  History of bilateral lower extremity amputee.  States his prosthesis will be available around 5/23 and he would like HHPT to be available to work with him on the next day.  Requesting walker and "straps" be ordered so he will have to work with therapy.  Already receives [facility] Butch Penny [phone]) from [facility] on Monday/Wednesday/Friday to assist with sacral wound vac.  Discussed with patient HHPT eval first to determine type of walker he will need and other equipment, patient agrees and understands orders would need to come from provider.  RNCM spoke with [facility] Butch Penny who also agrees HHPT eval prior to ordering equipment.  Patient did not verbally consent to program, did not want to answer questions and only wanted to discuss his wants of PCP.  RNCM contacted PCP office and left message for Deja. ? ?Follow Up Plan:  ?Awaiting call back from provider or assistant, Deja.   RNCM will update patient once spoken to PCP or assistant. ? ?Hubert Azure RN, MSN ?RN Care Management Coordinator  ?[place] ?[phone] ?Kramer [email or web address] ? ?

## 2022-01-14 ENCOUNTER — Encounter (HOSPITAL_BASED_OUTPATIENT_CLINIC_OR_DEPARTMENT_OTHER): Payer: Medicare HMO | Admitting: General Surgery

## 2022-01-14 ENCOUNTER — Telehealth: Payer: Self-pay | Admitting: Family

## 2022-01-14 DIAGNOSIS — Z89512 Acquired absence of left leg below knee: Secondary | ICD-10-CM | POA: Diagnosis not present

## 2022-01-14 DIAGNOSIS — Z89511 Acquired absence of right leg below knee: Secondary | ICD-10-CM | POA: Diagnosis not present

## 2022-01-14 DIAGNOSIS — L98422 Non-pressure chronic ulcer of back with fat layer exposed: Secondary | ICD-10-CM | POA: Diagnosis not present

## 2022-01-14 DIAGNOSIS — L89154 Pressure ulcer of sacral region, stage 4: Secondary | ICD-10-CM | POA: Diagnosis not present

## 2022-01-14 NOTE — Telephone Encounter (Signed)
[name] with Chronic Care Management is requesting a call back regarding pt stating there are some concerns and requests. ?

## 2022-01-14 NOTE — Progress Notes (Signed)
WINTHROP, SHANNAHAN ([id number]) ?Visit Report for 01/14/2022 ?Chief Complaint Document Details ?Patient Name: Date of Service: ?HEUMA NN, [name] 01/14/2022 12:30 PM ?Medical Record Number: [id number] ?Patient Account Number: [id number] ?Date of Birth/Sex: Treating RN: ?1965-12-02 (56 y.o. [name] ?Primary Care Provider: [name] Other Clinician: ?Referring Provider: ?Treating Provider/Extender: [name] ?[name] ?Weeks in Treatment: 0 ?Information Obtained from: Patient ?Chief Complaint ?Patient is at the clinic for treatment of an open pressure ulcer ?Electronic Signature(s) ?Signed: 01/14/2022 1:30:54 PM By: [name] MD FACS ?Entered By: [name] on 01/14/2022 13:30:54 ?-------------------------------------------------------------------------------- ?Debridement Details ?Patient Name: Date of Service: ?HEUMA NN, [name] 01/14/2022 12:30 PM ?Medical Record Number: [id number] ?Patient Account Number: [id number] ?Date of Birth/Sex: Treating RN: ?[birth year] (56 y.o. [name] ?Primary Care Provider: [name] Other Clinician: ?Referring Provider: ?Treating Provider/Extender: [name] ?[name] ?Weeks in Treatment: 0 ?Debridement Performed for Assessment: Wound #1 Sacrum ?Performed By: Physician [name], MD ?Debridement Type: Debridement ?Level of Consciousness (Pre-procedure): Awake and Alert ?Pre-procedure Verification/Time Out Yes - 13:25 ?Taken: ?Start Time: 13:26 ?Pain Control: ?Other : benzocaine 20% spray ?T Area Debrided (L x W): ?otal 3 (cm) x 2.5 (cm) = 7.5 (cm?) ?Tissue and other material debrided: ?Non-Viable, Callus, Skin: Epidermis ?Level: Skin/Epidermis ?Debridement Description: Selective/Open Wound ?Instrument: Curette ?Bleeding: None ?Procedural Pain: 0 ?Post Procedural Pain: 0 ?Response to Treatment: Procedure was tolerated well ?Level of Consciousness (Post- Awake and Alert ?procedure): ?Post Debridement  Measurements of Total Wound ?Length: (cm) 3 ?Stage: Category/Stage IV ?Width: (cm) 2.8 ?Depth: (cm) 1.8 ?Volume: (cm?) 11.875 ?Character of Wound/Ulcer Post Debridement: Improved ?Post Procedure Diagnosis ?Same as Pre-procedure ?Electronic Signature(s) ?Signed: 01/14/2022 2:22:22 PM By: [name] MD FACS ?Signed: 01/14/2022 5:37:56 PM By: Baruch Gouty RN, BSN ?Entered By: Baruch Gouty on 01/14/2022 13:29:58 ?-------------------------------------------------------------------------------- ?HPI Details ?Patient Name: Date of Service: ?HEUMA NN, [name] 01/14/2022 12:30 PM ?Medical Record Number: [id number] ?Patient Account Number: [id number] ?Date of Birth/Sex: Treating RN: ?[birth year] (56 y.o. [name] ?Primary Care Provider: [name] Other Clinician: ?Referring Provider: ?Treating Provider/Extender: [name] ?[name] ?Weeks in Treatment: 0 ?History of Present Illness ?HPI Description: ADMISSION ?01/08/2022 ?This is a 56 year old male with a past medical history notable for type 2 diabetes mellitus (last A1c was 8.6) congestive heart failure, hypertension, chronic ?pain, and bilateral below-knee amputations. His most recent amputation was in November 2022. While he was in the hospital, he developed a sacral pressure ?ulcer. He was subsequently in a skilled nursing facility for some time. He was discharged with home health and had been in a wound VAC, but was then ?admitted to the hospital last week when the wound appeared to be worsening. Apparently the periwound skin was macerated and the device that he had been ?using was leaking quite a bit. Evaluation while in the hospital included a consultation with infectious disease who did not think he had any evidence of ?osteomyelitis, plastic surgery who felt that he was not an appropriate flap candidate (their note also states that he is not interested in a flap) and wound care ?who took him out of the wound VAC and initiated  wet-to-dry dressing changes. He has a new wound VAC from [facility] on order, anticipated delivery today. ?The wound itself is fairly small and isolated to the sacrum. There is muscle exposed. No bone is appreciated but it is palpable beneath the surface. The muscle ?itself is bit pale and there is heaped up epibole around the wound edges. No significant odor or drainage. ?01/14/2022:  His wound VAC was not initiated until this past Friday. He has not had any issues with the VAC but today we found that the bridge foam was ?applied directly to the skin rather than over a layer of adhesive drape. His home health nurse also requested that we consider applying silver collagen to the ?wound bed in addition to the VAC. There is still a little bit of heaped up senescent skin around the wound periphery. No significant change to the wound ?dimensions. ?Electronic Signature(s) ?Signed: 01/14/2022 1:32:19 PM By: [name] MD FACS ?Entered By: [name] on 01/14/2022 13:32:19 ?-------------------------------------------------------------------------------- ?Physical Exam Details ?Patient Name: Date of Service: ?HEUMA NN, [name] 01/14/2022 12:30 PM ?Medical Record Number: [id number] ?Patient Account Number: [id number] ?Date of Birth/Sex: Treating RN: ?[birth year] (56 y.o. [name] ?Primary Care Provider: [name] Other Clinician: ?Referring Provider: ?Treating Provider/Extender: [name] ?[name] ?Weeks in Treatment: 0 ?Constitutional ?He is hypertensive, but asymptomatic.. . . . No acute distress. ?Respiratory ?Normal work of breathing on room air. ?Notes ?01/14/2022: There is no significant change to the wound dimensions. He does have some irritated skin where the bridging foam was applied directly to his skin ?rather than over plastic draping. There is a little bit of tissue maceration surrounding the wound. He has a small accumulation of slough on the wound surface ?as well as some  persistent senescent skin. ?Electronic Signature(s) ?Signed: 01/14/2022 1:33:46 PM By: [name] MD FACS ?Entered By: [name] on 01/14/2022 13:33:46 ?-------------------------------------------------------------------------------- ?Physician Orders Details ?Patient Name: ?Date of Service: ?HEUMA NN, [name] 01/14/2022 12:30 PM ?Medical Record Number: [id number] ?Patient Account Number: [id number] ?Date of Birth/Sex: ?Treating RN: ?May 04, 1966 (56 y.o. [name] ?Primary Care Provider: [name] ?Other Clinician: ?Referring Provider: ?Treating Provider/Extender: [name] ?[name] ?Weeks in Treatment: 0 ?Verbal / Phone Orders: No ?Diagnosis Coding ?ICD-10 Coding ?Code Description ?L89.154 Pressure ulcer of sacral region, stage 4 ?D17.616 Acquired absence of left leg below knee ?Z89.511 Acquired absence of right leg below knee ?Follow-up Appointments ?ppointment in 1 week. - Dr. Lucienne Capers with [facility] ?Return A ?Wed. 5/17 @ 12:30 pm ?Bathing/ Shower/ Hygiene ?May shower with protection but do not get wound dressing(s) wet. ?Negative Presssure Wound Therapy ?Wound #1 Sacrum ?Wound Vac to wound continuously at 186m/hg pressure ?Black Foam ?Off-Loading ?Gel mattress overlay (Group 1) ?Turn and reposition every 2 hours ?Home Health ?Dressing changes to be completed by Home Health on Monday / Wednesday / Friday except when patient has scheduled [facility] - Be sure no sponge directly on the skin, place drape under the bridge ?Other Home Health Orders/Instructions: - [facility] ?Wound Treatment ?Wound #1 - Sacrum ?Prim Dressing: Promogran Prisma Matrix, 4.34 (sq in) (silver collagen) 3 x Per Week/30 Days ?ary ?Discharge Instructions: lay in base of wound under the VAC sponge ?Prim Dressing: VAC 3 x Per Week/30 Days ?ary ?Discharge Instructions: be sure to place sponge in undermining and drape under the bridge ?Patient Medications ?llergies: Bactrim, Ceprotin  (Blue Bar), ciprofloxacin, Levaquin ?A ?Notifications Medication Indication Start End ?prior to debridement 01/14/2022 ?benzocaine ?DOSE topical 20 % aerosol - aerosol topical ?Electronic Signature(s) ?Signed: 01/14/2022

## 2022-01-14 NOTE — Progress Notes (Signed)
ARTEMIO, DOBIE ([id number]) ?Visit Report for 01/14/2022 ?Arrival Information Details ?Patient Name: Date of Service: ?[name], [name] 01/14/2022 12:30 PM ?Medical Record Number: [id number] ?Patient Account Number: [id number] ?Date of Birth/Sex: Treating RN: ?August 11, 1966 (56 y.o. [name] ?Primary Care [name]: [name] Other Clinician: ?Referring Soham Hollett: ?Treating Miyuki Rzasa/Extender: [name] ?[name] ?Weeks in Treatment: 0 ?Visit Information History Since Last Visit ?Added or deleted any medications: No ?Patient Arrived: Wheel Chair ?Any new allergies or adverse reactions: No ?Arrival Time: 12:51 ?Had a fall or experienced change in No ?Accompanied By: friend ?activities of daily living that may affect ?Transfer Assistance: Transfer Board ?risk of falls: ?Patient Identification Verified: Yes ?Signs or symptoms of abuse/neglect since last visito No ?Secondary Verification Process Completed: Yes ?Hospitalized since last visit: No ?Patient Requires Transmission-Based Precautions: No ?Implantable device outside of the clinic excluding No ?Patient Has Alerts: No ?cellular tissue based products placed in the center ?since last visit: ?Has Dressing in Place as Prescribed: Yes ?Pain Present Now: Yes ?Electronic Signature(s) ?Signed: 01/14/2022 5:37:56 PM By: Baruch Gouty RN, BSN ?Entered By: Baruch Gouty on 01/14/2022 12:52:12 ?-------------------------------------------------------------------------------- ?Encounter Discharge Information Details ?Patient Name: Date of Service: ?[name], [name] 01/14/2022 12:30 PM ?Medical Record Number: [id number] ?Patient Account Number: [id number] ?Date of Birth/Sex: Treating RN: ?1966-09-06 (56 y.o. [name] ?Primary Care Whittney Steenson: [name] Other Clinician: ?Referring Ngai Parcell: ?Treating Larisa Lanius/Extender: [name] ?[name] ?Weeks in Treatment: 0 ?Encounter Discharge Information Items Post Procedure  Vitals ?Discharge Condition: Stable ?Temperature (F): 97.9 ?Ambulatory Status: Wheelchair ?Pulse (bpm): 96 ?Discharge Destination: Home ?Respiratory Rate (breaths/min): 18 ?Transportation: Private Auto ?Blood Pressure (mmHg): 157/95 ?Accompanied By: friend ?Schedule Follow-up Appointment: Yes ?Clinical Summary of Care: Patient Declined ?Electronic Signature(s) ?Signed: 01/14/2022 5:37:56 PM By: Baruch Gouty RN, BSN ?Entered By: Baruch Gouty on 01/14/2022 13:59:06 ?-------------------------------------------------------------------------------- ?Lower Extremity Assessment Details ?Patient Name: ?Date of Service: ?[name], [name] 01/14/2022 12:30 PM ?Medical Record Number: [id number] ?Patient Account Number: [id number] ?Date of Birth/Sex: ?Treating RN: ?[birth year] (56 y.o. [name] ?Primary Care Kaari Zeigler: [name] ?Other Clinician: ?Referring Suella Cogar: ?Treating Kaliana Albino/Extender: [name] ?[name] ?Weeks in Treatment: 0 ?Electronic Signature(s) ?Signed: 01/14/2022 5:37:56 PM By: Baruch Gouty RN, BSN ?Entered By: Baruch Gouty on 01/14/2022 13:00:29 ?-------------------------------------------------------------------------------- ?Multi Wound Chart Details ?Patient Name: ?Date of Service: ?[name], [name] 01/14/2022 12:30 PM ?Medical Record Number: [id number] ?Patient Account Number: [id number] ?Date of Birth/Sex: ?Treating RN: ?[birth year] (56 y.o. [name] ?Primary Care Kazimierz Springborn: [name] ?Other Clinician: ?Referring Idus Rathke: ?Treating Jencarlos Nicolson/Extender: [name] ?[name] ?Weeks in Treatment: 0 ?Vital Signs ?Height(in): ?Capillary Blood Glucose(mg/dl): 170 ?Weight(lbs): ?Pulse(bpm): 96 ?Body Mass Index(BMI): ?Blood Pressure(mmHg): 157/95 ?Temperature(??F): 97.9 ?Respiratory Rate(breaths/min): 18 ?Photos: [N/A:N/A] ?Sacrum N/A N/A ?Wound Location: ?Pressure Injury N/A N/A ?Wounding Event: ?Pressure Ulcer N/A N/A ?Primary  Etiology: ?Congestive Heart Failure, N/A N/A ?Comorbid History: ?Hypertension, Type II Diabetes, ?Osteomyelitis, Confinement Anxiety ?07/29/2021 N/A N/A ?Date Acquired: ?0 N/A N/A ?Weeks of Treatment: ?Open N/A N/A ?Wound Status: ?No N/A N/A ?Wound Recurrence: ?3x2.5x1.8 N/A N/A ?Measurements L x W x D (cm) ?5.89 N/A N/A ?A (cm?) : ?rea ?10.603 N/A N/A ?Volume (cm?) : ?0.00% N/A N/A ?% Reduction in A rea: ?-63.60% N/A N/A ?% Reduction in Volume: ?12 ?Starting Position 1 (o'clock): ?12 ?Ending Position 1 (o'clock): ?2.7 ?Maximum Distance 1 (cm): ?Yes N/A N/A ?Undermining: ?Category/Stage IV N/A N/A ?Classification: ?Medium N/A N/A ?Exudate A mount: ?Serosanguineous N/A N/A ?Exudate Type: ?red, brown N/A N/A ?Exudate Color: ?Well defined, not attached N/A N/A ?  Wound Margin: ?Large (67-100%) N/A N/A ?Granulation Amount: ?Red, Pink, Pale N/A N/A ?Granulation Quality: ?Small (1-33%) N/A N/A ?Necrotic Amount: ?Fat Layer (Subcutaneous Tissue): Yes N/A N/A ?Exposed Structures: ?Muscle: Yes ?Fascia: No ?Tendon: No ?Joint: No ?Bone: No ?Small (1-33%) N/A N/A ?Epithelialization: ?Debridement - Selective/Open Wound N/A N/A ?Debridement: ?Pre-procedure Verification/Time Out 13:25 N/A N/A ?Taken: ?Other N/A N/A ?Pain Control: ?Callus N/A N/A ?Tissue Debrided: ?Skin/Epidermis N/A N/A ?Level: ?7.5 N/A N/A ?Debridement A (sq cm): ?rea ?Curette N/A N/A ?Instrument: ?None N/A N/A ?Bleeding: ?0 N/A N/A ?Procedural Pain: ?0 N/A N/A ?Post Procedural Pain: ?Procedure was tolerated well N/A N/A ?Debridement Treatment Response: ?3x2.8x1.8 N/A N/A ?Post Debridement Measurements L x ?W x D (cm) ?11.875 N/A N/A ?Post Debridement Volume: (cm?) ?Category/Stage IV N/A N/A ?Post Debridement Stage: ?macerated wound margins N/A N/A ?Assessment Notes: ?Debridement N/A N/A ?Procedures Performed: ?Negative Pressure Wound Therapy ?Application (NPWT) ?Treatment Notes ?Electronic Signature(s) ?Signed: 01/14/2022 1:30:47 PM By: [name] MD  FACS ?Signed: 01/14/2022 5:37:56 PM By: Baruch Gouty RN, BSN ?Entered By: [name] on 01/14/2022 13:30:47 ?-------------------------------------------------------------------------------- ?Multi-Disciplinary Care Plan Details ?Patient Name: ?Date of Service: ?[name], [name] 01/14/2022 12:30 PM ?Medical Record Number: [id number] ?Patient Account Number: [id number] ?Date of Birth/Sex: ?Treating RN: ?01/05/1966 (56 y.o. [name] ?Primary Care Almas Rake: [name] ?Other Clinician: ?Referring Lincoln Kleiner: ?Treating Shaheem Pichon/Extender: [name] ?[name] ?Weeks in Treatment: 0 ?Multidisciplinary Care Plan reviewed with physician ?Active Inactive ?Nutrition ?Nursing Diagnoses: ?Impaired glucose control: actual or potential ?Potential for alteratiion in Nutrition/Potential for imbalanced nutrition ?Goals: ?Patient/caregiver will maintain therapeutic glucose control ?Date Initiated: 01/08/2022 ?Target Resolution Date: 02/05/2022 ?Goal Status: Active ?Interventions: ?Assess HgA1c results as ordered upon admission and as needed ?Assess patient nutrition upon admission and as needed per policy ?Provide education on elevated blood sugars and impact on wound healing ?Treatment Activities: ?Dietary management education, guidance and counseling : 01/08/2022 ?Patient referred to Primary Care Physician for further nutritional evaluation : 01/08/2022 ?Notes: ?Pressure ?Nursing Diagnoses: ?Knowledge deficit related to causes and risk factors for pressure ulcer development ?Knowledge deficit related to management of pressures ulcers ?Potential for impaired tissue integrity related to pressure, friction, moisture, and shear ?Goals: ?Patient/caregiver will verbalize understanding of pressure ulcer management ?Date Initiated: 01/08/2022 ?Target Resolution Date: 02/05/2022 ?Goal Status: Active ?Interventions: ?Assess: immobility, friction, shearing, incontinence upon admission and as needed ?Assess offloading  mechanisms upon admission and as needed ?Assess potential for pressure ulcer upon admission and as needed ?Notes: ?Wound/Skin Impairment ?Nursing Diagnoses: ?Impaired tissue integrity ?Knowledge deficit related to ulceration/compr

## 2022-01-16 ENCOUNTER — Ambulatory Visit: Payer: Self-pay | Admitting: *Deleted

## 2022-01-16 NOTE — Chronic Care Management (AMB) (Signed)
?  Care Management  ? ?Outreach Note ? ?01/16/2022 ?Name: Leul Narramore MRN: [id number] DOB: 06-02-1966 ? ?Referred by: [name], NP ?Reason for referral : Care Coordination (HHPT) ? ? ?Received call back from  [facility] CMA to relay patient request for HHPT and Xanax orders.  States she will discuss with [name] NP.  Requested she contact patient if follow up visit needed with patient (patient requesting televisit if needed).  Patient updated on request relayed.. ? ?Follow Up Plan:  ?The care management team will reach out to the patient again over the next 20 days.  ? ?Hubert Azure RN, MSN ?RN Care Management Coordinator  ?[place] ?[phone] ?[email or web address] ? ?

## 2022-01-16 NOTE — Telephone Encounter (Signed)
[name] with Chronic Care Management has called again and would like a call back please ?

## 2022-01-18 NOTE — Telephone Encounter (Signed)
sorry, Deja, which message?

## 2022-01-19 ENCOUNTER — Other Ambulatory Visit: Payer: Self-pay | Admitting: Family

## 2022-01-19 DIAGNOSIS — Z449 Encounter for fitting and adjustment of unspecified external prosthetic device: Secondary | ICD-10-CM

## 2022-01-19 DIAGNOSIS — Z89511 Acquired absence of right leg below knee: Secondary | ICD-10-CM

## 2022-01-19 NOTE — Telephone Encounter (Signed)
Should be under your staff message  ?

## 2022-01-20 ENCOUNTER — Ambulatory Visit: Payer: Medicare HMO | Admitting: *Deleted

## 2022-01-20 NOTE — Chronic Care Management (AMB) (Signed)
?  Care Management  ? ?Outreach Note ? ?01/20/2022 ?Name: [name] MRN: [id number] DOB: Mar 23, 1966 ? ?Referred by: [name], NP ?Reason for referral : Care Coordination ([place], ANXIETY) ? ? ?Successful telephone outreach to patient.  Notified patient HHPT orders were placed yesterday.  RNCM attempted to contact [facility], message left for [name] to verify appointment with therapy.  Encouraged patient to make virtual appointment with PCP to discuss meds for anxiety. ? ?Follow Up Plan:  ?The care management team will reach out to the patient again over the next 10 days.  ? ?Hubert Azure RN, MSN ?RN Care Management Coordinator  ?[place] ?[phone] ?[email or web address] ? ?

## 2022-01-21 ENCOUNTER — Telehealth: Payer: Medicare HMO | Admitting: Family

## 2022-01-21 ENCOUNTER — Other Ambulatory Visit: Payer: Self-pay | Admitting: Nurse Practitioner

## 2022-01-21 ENCOUNTER — Encounter (HOSPITAL_BASED_OUTPATIENT_CLINIC_OR_DEPARTMENT_OTHER): Payer: Medicare HMO | Admitting: General Surgery

## 2022-01-21 ENCOUNTER — Telehealth (INDEPENDENT_AMBULATORY_CARE_PROVIDER_SITE_OTHER): Payer: Medicare HMO | Admitting: Family

## 2022-01-21 ENCOUNTER — Encounter: Payer: Self-pay | Admitting: Family

## 2022-01-21 VITALS — Ht 68.0 in | Wt 208.0 lb

## 2022-01-21 DIAGNOSIS — Z89512 Acquired absence of left leg below knee: Secondary | ICD-10-CM | POA: Diagnosis not present

## 2022-01-21 DIAGNOSIS — F411 Generalized anxiety disorder: Secondary | ICD-10-CM

## 2022-01-21 DIAGNOSIS — F5101 Primary insomnia: Secondary | ICD-10-CM

## 2022-01-21 DIAGNOSIS — Z89511 Acquired absence of right leg below knee: Secondary | ICD-10-CM | POA: Diagnosis not present

## 2022-01-21 DIAGNOSIS — F5104 Psychophysiologic insomnia: Secondary | ICD-10-CM | POA: Diagnosis not present

## 2022-01-21 DIAGNOSIS — L89154 Pressure ulcer of sacral region, stage 4: Secondary | ICD-10-CM | POA: Diagnosis not present

## 2022-01-21 MED ORDER — ZOLPIDEM TARTRATE 10 MG PO TABS: 10.0000 mg | ORAL_TABLET | Freq: Every evening | ORAL | 1 refills | Status: DC | PRN

## 2022-01-21 MED ORDER — BUSPIRONE HCL 5 MG PO TABS: 5.0000 mg | ORAL_TABLET | Freq: Three times a day (TID) | ORAL | 0 refills | Status: DC

## 2022-01-21 NOTE — Assessment & Plan Note (Signed)
Chronic - reports taking Celexa in past that did not work and made him feeling nothing. Reports being afraid to be by himself. Advised on benefits of Zoloft, but refuses to take any anti-depressant.  States nothing but Xanax works for him, very insistent. Advised on the addictive properties of med, has hx of narcotic pain meds off & on due to current decubitus ulcer wound requiring a wound vac 24/7, and I will not prescribe this medication. Discussed alternatives and he is willing to try Buspirone, advised on use & SE, f/u in 1 month or prn.  ?

## 2022-01-21 NOTE — Progress Notes (Signed)
MyChart Video Visit    Virtual Visit via Video Note   This visit type was conducted due to national recommendations for restrictions regarding the COVID-19 Pandemic (e.g. social distancing) in an effort to limit this patient's exposure and mitigate transmission in our community. This patient is at least at moderate risk for complications without adequate follow up. This format is felt to be most appropriate for this patient at this time. Physical exam was limited by quality of the video and audio technology used for the visit. CMA was able to get the patient set up on a video visit.  Patient location: Home. Patient and provider in visit Provider location: Office  I discussed the limitations of evaluation and management by telemedicine and the availability of in person appointments. The patient expressed understanding and agreed to proceed.  Visit Date: 01/21/2022  Today's healthcare provider: [name], NP     Subjective:    [name], male    DOB: April 06, 1966, 56 y.o.   MRN: [id number]  Chief Complaint  Patient presents with   Anxiety    Pt c/o anxiety because of ''what he's been through.'' And he is scared of being home alone. Pt states nothing else works for him only xanax.    HPI INSOMNIA:  How long: years. Difficulty initiating sleep: yes.  Difficulty maintaining sleep: yes.  OTC meds tried: no. RX meds in past: Trazodone, Ambien. Sleep hygiene measures: no. Started any new meds recently: no. New stressors: yes, pt has a deep decubitus ulcer on buttock that is painful, has to have a wound vac on 24/7, pain also making it difficult to sleep.   Anxiety/Depression: Patient complains of anxiety disorder.   He has the following symptoms: feelings of losing control, insomnia, irritable, racing thoughts, sweating.  Onset of symptoms was approximately  years ago, He denies current suicidal and homicidal ideation.  Possible organic causes contributing are:  none.  Risk factors: negative life event bilateral amputee with current wound requiring daily wound care.  Previous treatment includes Xanax.  He complains of the following side effects from the treatment: none. Pt states he took Celexa in past but made him "a zombie". Refuses to take any anti-depressant medication. Assessment & Plan:   Problem List Items Addressed This Visit       Other   Insomnia - Primary    Chronic - pt reports Trazodone does not work, RX is at pharmacy from previous provider that he reports he has not picked up yet. Called [facility] and confirmed RX not picked up, RX cancelled at 5:10pm. Has taken Ambien in the past that has worked but recently took a friend's that did not work. Will send 27m Ambien, advised to let me know if not working.        Relevant Medications   zolpidem (AMBIEN) 10 MG tablet   Generalized anxiety disorder    Chronic - reports taking Celexa in past that did not work and made him feeling nothing. Reports being afraid to be by himself. Advised on benefits of Zoloft, but refuses to take any anti-depressant.  States nothing but Xanax works for him, very insistent. Advised on the addictive properties of med, has hx of narcotic pain meds off & on due to current decubitus ulcer wound requiring a wound vac 24/7, and I will not prescribe this medication. Discussed alternatives and he is willing to try Buspirone, advised on use & SE, f/u in 1 month or prn.  Relevant Medications   busPIRone (BUSPAR) 5 MG tablet    Past Medical History:  Diagnosis Date   Acquired complex renal cyst 09/25/2019   AKI (acute kidney injury) ([place]) 07/22/2021   Chest pain 07/22/2021   Chronic diastolic CHF (congestive heart failure) ([place])    Complication of anesthesia    Decubitus ulcer of sacral area 12/15/2021   Depression    Diabetes mellitus without complication ([place])    Healthcare maintenance 09/25/2019   Hematuria 09/25/2019   Hypertension    Lactic acidosis  12/15/2021   Leukocytosis 09/13/2021   Sacral pressure injury of skin 07/27/2021   Septic shock ([place]) 07/22/2021   Severe sepsis with acute organ dysfunction ([place]) 07/22/2021   Shortness of breath 05/16/2021   Stage 3a chronic kidney disease ([place]) 12/24/3788   Systolic dysfunction     Past Surgical History:  Procedure Laterality Date   AMPUTATION Left 07/23/2021   Procedure: LEFT BELOW KNEE AMPUTATION;  Surgeon: [name], MD;  Location: [facility];  Service: Orthopedics;  Laterality: Left;   BELOW KNEE LEG AMPUTATION Right    BUBBLE STUDY  07/29/2021   Procedure: BUBBLE STUDY;  Surgeon: Sueanne Margarita, MD;  Location: [place];  Service: Cardiovascular;;   CARDIOVERSION N/A 07/29/2021   Procedure: CARDIOVERSION;  Surgeon: Sueanne Margarita, MD;  Location[facility] END[facility]COPY;  Service: Cardiovascular;  Laterality: N/A;   RADIOLOGY WITH ANESTHESIA N/A 08/05/2021   Procedure: MRI LUMBAR WITH AND WITHOUT; THORASIC SPINE WITH AND WITHOUT WITH ANESTHESIA;  Surgeon: Radiologist, Medication, MD;  Location: [place];  Service: Radiology;  Laterality: N/A;   RADIOLOGY WITH ANESTHESIA N/A 08/07/2021   Procedure: MRI WITH LUMBER WITH AND WITHOUT CONTRAST,THORACIC WITH AND WITHOUT CONTRAST;  Surgeon: Radiologist, Medication, MD;  Location: [facility];  Service: Radiology;  Laterality: N/A;   RADIOLOGY WITH ANESTHESIA N/A 09/13/2021   Procedure: MRI WITH ANESTHESIA;  Surgeon: Luanne Bras, MD;  Location: [place];  Service: Radiology;  Laterality: N/A;   TEE WITHOUT CARDIOVERSION N/A 07/29/2021   Procedure: TRANSESOPHAGEAL ECHOCARDIOGRAM (TEE);  Surgeon: Sueanne Margarita, MD;  Location[facility] ENDOSCOPY;  Service: Cardiovascular;  Laterality: N/A;    Outpatient Medications Prior to Visit  Medication Sig Dispense Refill   amiodarone (PACERONE) 200 MG tablet Take 1 tablet (200 mg total) by mouth daily. 30 tablet 1   blood glucose meter kit and supplies KIT Dispense based on patient and insurance preference. Use up to four  times daily as directed. 1 each 0   finasteride (PROSCAR) 5 MG tablet Take 1 tablet (5 mg total) by mouth daily. 30 tablet 2   furosemide (LASIX) 40 MG tablet Take 40 mg by mouth daily.     insulin NPH-regular Human (NOVOLIN 70/30 RELION) (70-30) 100 UNIT/ML injection Inject 15 Units into the skin 2 (two) times daily with a meal. 10 mL 11   Insulin Pen Needle 32G X 4 MM MISC Use as directed 100 each 1   insulin regular (NOVOLIN R) 100 units/mL injection Inject 0-20 Units into the skin 4 (four) times daily as needed for high blood sugar.     metoprolol succinate (TOPROL-XL) 25 MG 24 hr tablet Take 1 tablet (25 mg total) by mouth daily. 30 tablet 1   protein supplement shake (PREMIER PROTEIN) LIQD Take 2 oz by mouth 3 (three) times daily with meals.     tamsulosin (FLOMAX) 0.4 MG CAPS capsule Take 1 capsule (0.4 mg total) by mouth daily. 30 capsule 2   testosterone cypionate (DEPOTESTOSTERONE CYPIONATE)  200 MG/ML injection Inject 1 mL (200 mg total) into the muscle every 14 (fourteen) days. 10 mL 0   gabapentin (NEURONTIN) 400 MG capsule Take 1 capsule (400 mg total) by mouth 3 (three) times daily. 90 capsule 1   methocarbamol (ROBAXIN) 500 MG tablet Take 1,000 mg by mouth 3 (three) times daily.     traZODone (DESYREL) 150 MG tablet TAKE 1 TABLET(150 MG) BY MOUTH AT BEDTIME AS NEEDED FOR SLEEP 15 tablet 0   apixaban (ELIQUIS) 5 MG TABS tablet Take 1 tablet (5 mg total) by mouth 2 (two) times daily. 60 tablet 0   No facility-administered medications prior to visit.    Allergies  Allergen Reactions   Bactrim [Sulfamethoxazole-Trimethoprim]    Ceprotin [Protein C Concentrate (Human)]    Ciprofloxacin Other (See Comments)    Kidney function   Levaquin [Levofloxacin]        Objective:     Physical Exam Vitals and nursing note reviewed.  Constitutional:      General: She is not in acute distress.    Appearance: Normal appearance.  HENT:     Head: Normocephalic.  Pulmonary:     Effort:  No respiratory distress.  Musculoskeletal:     Cervical back: Normal range of motion.  Skin:    General: Skin is dry.     Coloration: Skin is not pale.  Neurological:     Mental Status: She is alert and oriented to person, place, and time.  Psychiatric:        Mood and Affect: Mood normal.   Ht '5\' 8"'  (1.727 m)   Wt 208 lb (94.3 kg)   BMI 31.63 kg/m   Wt Readings from Last 3 Encounters:  01/21/22 208 lb (94.3 kg)  12/31/21 208 lb (94.3 kg)  09/14/21 240 lb 4.8 oz (109 kg)     I discussed the assessment and treatment plan with the patient. The patient was provided an opportunity to ask questions and all were answered. The patient agreed with the plan and demonstrated an understanding of the instructions.   The patient was advised to call back or seek an in-person evaluation if the symptoms worsen or if the condition fails to improve as anticipated.  I provided 24 minutes of face-to-face time during this encounter.  [name], NP [place] [phone] (phone) [phone] (fax)  [place]

## 2022-01-21 NOTE — Progress Notes (Signed)
[name] ([id number]) ?Visit Report for 01/21/2022 ?Chief Complaint Document Details ?Patient Name: Date of Service: ?[name], [name] 01/21/2022 12:30 PM ?Medical Record Number: [id number] ?Patient Account Number: [id number] ?Date of Birth/Sex: Treating RN: ?Mar 25, 1966 (56 y.o. [name] ?Primary Care Provider: Jeanie [name] Other Clinician: ?Referring Provider: ?Treating Provider/Extender: [name] ?[name] ?Weeks in Treatment: 1 ?Information Obtained from: Patient ?Chief Complaint ?Patient is at the clinic for treatment of an open pressure ulcer ?Electronic Signature(s) ?Signed: 01/21/2022 1:31:48 PM By: [name] Maudlin MD FACS ?Entered By: [name] on 01/21/2022 13:31:48 ?-------------------------------------------------------------------------------- ?Debridement Details ?Patient Name: Date of Service: ?[name], [name] 01/21/2022 12:30 PM ?Medical Record Number: [id number] ?Patient Account Number: [id number] ?Date of Birth/Sex: Treating RN: ?[birth year] (57 y.o. [name] ?Primary Care Provider: Jeanie [name] Other Clinician: ?Referring Provider: ?Treating Provider/Extender: [name] ?[name] ?Weeks in Treatment: 1 ?Debridement Performed for Assessment: Wound #1 Sacrum ?Performed By: Physician [name] Maudlin, MD ?Debridement Type: Debridement ?Level of Consciousness (Pre-procedure): Awake and Alert ?Pre-procedure Verification/Time Out Yes - 13:25 ?Taken: ?Start Time: 13:26 ?Pain Control: ?Other : benzocaine 20% spray ?T Area Debrided (L x W): ?otal 2.6 (cm) x 2.3 (cm) = 5.98 (cm?) ?Tissue and other material debrided: ?Non-Viable, Slough, Subcutaneous, Skin: Epidermis, Slough ?Level: Skin/Subcutaneous Tissue ?Debridement Description: Excisional ?Instrument: Curette ?Bleeding: Minimum ?Hemostasis Achieved: Pressure ?Procedural Pain: 1 ?Post Procedural Pain: 1 ?Response to Treatment: Procedure was tolerated well ?Level of Consciousness  (Post- Awake and Alert ?procedure): ?Post Debridement Measurements of Total Wound ?Length: (cm) 2.6 ?Stage: Category/Stage IV ?Width: (cm) 2.3 ?Depth: (cm) 1.7 ?Volume: (cm?) 7.984 ?Character of Wound/Ulcer Post Debridement: Requires Further Debridement ?Post Procedure Diagnosis ?Same as Pre-procedure ?Electronic Signature(s) ?Signed: 01/21/2022 2:52:08 PM By: [name] Maudlin MD FACS ?Signed: 01/21/2022 6:28:42 PM By: [name] Gouty RN, BSN ?Entered By: [name] on 01/21/2022 13:28:38 ?-------------------------------------------------------------------------------- ?HPI Details ?Patient Name: Date of Service: ?[name], [name] 01/21/2022 12:30 PM ?Medical Record Number: [id number] ?Patient Account Number: [id number] ?Date of Birth/Sex: Treating RN: ?July 14, 1966 (56 y.o. [name] ?Primary Care Provider: Jeanie [name] Other Clinician: ?Referring Provider: ?Treating Provider/Extender: [name] ?[name] ?Weeks in Treatment: 1 ?History of Present Illness ?HPI Description: ADMISSION ?01/08/2022 ?This is a 56 year old male with a past medical history notable for type 2 diabetes mellitus (last A1c was 8.6) congestive heart failure, hypertension, chronic ?pain, and bilateral below-knee amputations. His most recent amputation was in November 2022. While he was in the hospital, he developed a sacral pressure ?ulcer. He was subsequently in a skilled nursing facility for some time. He was discharged with home health and had been in a wound VAC, but was then ?admitted to the hospital last week when the wound appeared to be worsening. Apparently the periwound skin was macerated and the device that he had been ?using was leaking quite a bit. Evaluation while in the hospital included a consultation with infectious disease who did not think he had any evidence of ?osteomyelitis, plastic surgery who felt that he was not an appropriate flap candidate (their note also states that he is not interested  in a flap) and wound care ?who took him out of the wound VAC and initiated wet-to-dry dressing changes. He has a new wound VAC from [facility] on order, anticipated delivery today. ?The wound itself is fairly small and isolated to the sacrum. There is muscle exposed. No bone is appreciated but it is palpable beneath the surface. The muscle ?itself is bit pale and there is heaped up epibole around the wound  edges. No significant odor or drainage. ?01/14/2022: His wound VAC was not initiated until this past Friday. He has not had any issues with the VAC but today we found that the bridge foam was ?applied directly to the skin rather than over a layer of adhesive drape. His home health nurse also requested that we consider applying silver collagen to the ?wound bed in addition to the VAC. There is still a little bit of heaped up senescent skin around the wound periphery. No significant change to the wound ?dimensions. ?01/21/2022: No significant change to the wound dimensions. The senescent skin has not reaccumulated. The periwound skin remains a bit macerated but without ?any obvious breakdown. The wound surface itself has a shiny appearance with a little bit of slough accumulation; no true robust granulation tissue at this time. ?Electronic Signature(s) ?Signed: 01/21/2022 1:32:41 PM By: [name] Maudlin MD FACS ?Entered By: [name] on 01/21/2022 13:32:41 ?-------------------------------------------------------------------------------- ?Physical Exam Details ?Patient Name: Date of Service: ?[name], [name] 01/21/2022 12:30 PM ?Medical Record Number: [id number] ?Patient Account Number: [id number] ?Date of Birth/Sex: Treating RN: ?May 03, 1966 (56 y.o. [name] ?Primary Care Provider: Jeanie [name] Other Clinician: ?Referring Provider: ?Treating Provider/Extender: [name] ?[name] ?Weeks in Treatment: 1 ?Constitutional ?He is hypertensive, but asymptomatic.. . . . No acute  distress. ?Respiratory ?Normal work of breathing on room air. ?Notes ?01/21/2022: No significant change to the wound dimensions. The senescent skin has not reaccumulated. The periwound skin remains a bit macerated but without ?any obvious breakdown. The wound surface itself has a shiny appearance with a little bit of slough accumulation; no true robust granulation tissue at this time. ?Electronic Signature(s) ?Signed: 01/21/2022 1:33:16 PM By: [name] Maudlin MD FACS ?Entered By: [name] on 01/21/2022 13:33:16 ?-------------------------------------------------------------------------------- ?Physician Orders Details ?Patient Name: ?Date of Service: ?[name], [name] 01/21/2022 12:30 PM ?Medical Record Number: [id number] ?Patient Account Number: [id number] ?Date of Birth/Sex: ?Treating RN: ?[birth year] (56 y.o. [name] ?Primary Care Provider: Jeanie [name] ?Other Clinician: ?Referring Provider: ?Treating Provider/Extender: [name] ?[name] ?Weeks in Treatment: 1 ?Verbal / Phone Orders: No ?Diagnosis Coding ?ICD-10 Coding ?Code Description ?L89.154 Pressure ulcer of sacral region, stage 4 ?K46.286 Acquired absence of left leg below knee ?Z89.511 Acquired absence of right leg below knee ?Follow-up Appointments ?ppointment in 1 week. - Dr. Lucienne Capers with [facility] ?Return A ?Wed. 5/24 @ 2:00 pm ?Bathing/ Shower/ Hygiene ?May shower with protection but do not get wound dressing(s) wet. ?Negative Presssure Wound Therapy ?Wound #1 Sacrum ?Wound Vac to wound continuously at 123m/hg pressure ?Black Foam ?Off-Loading ?Gel mattress overlay (Group 1) ?Turn and reposition every 2 hours ?Home Health ?Dressing changes to be completed by Home Health on Monday / Wednesday / Friday except when patient has scheduled visit at Wound ?[facility] - Be sure no sponge directly on the skin, place drape under the bridge ?Other Home Health Orders/Instructions: - [facility] ?Wound Treatment ?Wound #1 -  Sacrum ?Prim Dressing: Promogran Prisma Matrix, 4.34 (sq in) (silver collagen) 3 x Per Week/30 Days ?ary ?Discharge Instructions: lay in base of wound under the VAC sponge ?Prim Dressing: VAC 3 x Per Week/30 Days ?ary ?[facility]

## 2022-01-21 NOTE — Progress Notes (Signed)
ANTHEM, FRAZER ([id number]) ?Visit Report for 01/21/2022 ?Arrival Information Details ?Patient Name: Date of Service: ?HEUMA NN, [name] 01/21/2022 12:30 PM ?Medical Record Number: [id number] ?Patient Account Number: [id number] ?Date of Birth/Sex: Treating RN: ?[birth year] (56 y.o. [name] ?Primary Care Laiana Fratus: [name] Other Clinician: ?Referring Cadyn Fann: ?Treating Cillian Gwinner/Extender: [name] ?[name] ?Weeks in Treatment: 1 ?Visit Information History Since Last Visit ?Added or deleted any medications: No ?Patient Arrived: Wheel Chair ?Any new allergies or adverse reactions: No ?Arrival Time: 12:56 ?Had a fall or experienced change in No ?Accompanied By: girlfriend ?activities of daily living that may affect ?Transfer Assistance: Transfer Board ?risk of falls: ?Patient Identification Verified: Yes ?Signs or symptoms of abuse/neglect since last visito No ?Secondary Verification Process Completed: Yes ?Hospitalized since last visit: No ?Patient Requires Transmission-Based Precautions: No ?Implantable device outside of the clinic excluding No ?Patient Has Alerts: No ?cellular tissue based products placed in the center ?since last visit: ?Has Dressing in Place as Prescribed: Yes ?Pain Present Now: Yes ?Electronic Signature(s) ?Signed: 01/21/2022 6:28:42 PM By: Baruch Gouty RN, BSN ?Entered By: Baruch Gouty on 01/21/2022 12:56:54 ?-------------------------------------------------------------------------------- ?Lower Extremity Assessment Details ?Patient Name: Date of Service: ?HEUMA NN, [name] 01/21/2022 12:30 PM ?Medical Record Number: [id number] ?Patient Account Number: [id number] ?Date of Birth/Sex: Treating RN: ?Nov 02, 1965 (56 y.o. [name] ?Primary Care Allen Egerton: [name] Other Clinician: ?Referring Ephrata Verville: ?Treating Kalin Kyler/Extender: [name] ?[name] ?Weeks in Treatment: 1 ?Electronic Signature(s) ?Signed: 01/21/2022 6:28:42 PM By:  Baruch Gouty RN, BSN ?Entered By: Baruch Gouty on 01/21/2022 12:59:40 ?-------------------------------------------------------------------------------- ?Multi Wound Chart Details ?Patient Name: Date of Service: ?HEUMA NN, [name] 01/21/2022 12:30 PM ?Medical Record Number: [id number] ?Patient Account Number: [id number] ?Date of Birth/Sex: Treating RN: ?[birth year] (56 y.o. [name] ?Primary Care Zayaan Kozak: [name] Other Clinician: ?Referring Mauricia Mertens: ?Treating Bettylee Feig/Extender: [name] ?[name] ?Weeks in Treatment: 1 ?Vital Signs ?Height(in): ?Capillary Blood Glucose(mg/dl): 160 ?Weight(lbs): ?Pulse(bpm): 80 ?Body Mass Index(BMI): ?Blood Pressure(mmHg): 163/96 ?Temperature(??F): 97.6 ?Respiratory Rate(breaths/min): 18 ?Photos: [N/A:N/A] ?Sacrum N/A N/A ?Wound Location: ?Pressure Injury N/A N/A ?Wounding Event: ?Pressure Ulcer N/A N/A ?Primary Etiology: ?Congestive Heart Failure, N/A N/A ?Comorbid History: ?Hypertension, Type II Diabetes, ?Osteomyelitis, Confinement Anxiety ?07/29/2021 N/A N/A ?Date Acquired: ?1 N/A N/A ?Weeks of Treatment: ?Open N/A N/A ?Wound Status: ?No N/A N/A ?Wound Recurrence: ?2.6x2.3x1.7 N/A N/A ?Measurements L x W x D (cm) ?4.697 N/A N/A ?A (cm?) : ?rea ?7.984 N/A N/A ?Volume (cm?) : ?20.30% N/A N/A ?% Reduction in A rea: ?-23.20% N/A N/A ?% Reduction in Volume: ?9 ?Starting Position 1 (o'clock): ?5 ?Ending Position 1 (o'clock): ?2.3 ?Maximum Distance 1 (cm): ?Yes N/A N/A ?Undermining: ?Category/Stage IV N/A N/A ?Classification: ?Medium N/A N/A ?Exudate A mount: ?Serous N/A N/A ?Exudate Type: ?amber N/A N/A ?Exudate Color: ?Well defined, not attached N/A N/A ?Wound Margin: ?Medium (34-66%) N/A N/A ?Granulation A mount: ?Red, Pink, Pale N/A N/A ?Granulation Quality: ?Medium (34-66%) N/A N/A ?Necrotic A mount: ?Fat Layer (Subcutaneous Tissue): Yes N/A N/A ?Exposed Structures: ?Muscle: Yes ?Fascia: No ?Tendon: No ?Joint: No ?Bone: No ?Small (1-33%) N/A  N/A ?Epithelialization: ?Debridement - Excisional N/A N/A ?Debridement: ?Pre-procedure Verification/Time Out 13:25 N/A N/A ?Taken: ?Other N/A N/A ?Pain Control: ?Subcutaneous, Slough N/A N/A ?Tissue Debrided: ?Skin/Subcutaneous Tissue N/A N/A ?Level: ?5.98 N/A N/A ?Debridement A (sq cm): ?rea ?Curette N/A N/A ?Instrument: ?Minimum N/A N/A ?Bleeding: ?Pressure N/A N/A ?Hemostasis A chieved: ?1 N/A N/A ?Procedural Pain: ?1 N/A N/A ?Post Procedural Pain: ?Procedure was tolerated well N/A N/A ?Debridement Treatment Response: ?2.6x2.3x1.7 N/A  N/A ?Post Debridement Measurements L x ?W x D (cm) ?7.984 N/A N/A ?Post Debridement Volume: (cm?) ?Category/Stage IV N/A N/A ?Post Debridement Stage: ?Debridement N/A N/A ?Procedures Performed: ?Negative Pressure Wound Therapy ?Maintenance (NPWT) ?Treatment Notes ?Electronic Signature(s) ?Signed: 01/21/2022 1:31:43 PM By: [name] MD FACS ?Signed: 01/21/2022 6:28:42 PM By: Baruch Gouty RN, BSN ?Entered By: [name] on 01/21/2022 13:31:43 ?-------------------------------------------------------------------------------- ?Multi-Disciplinary Care Plan Details ?Patient Name: ?Date of Service: ?HEUMA NN, [name] 01/21/2022 12:30 PM ?Medical Record Number: [id number] ?Patient Account Number: [id number] ?Date of Birth/Sex: ?Treating RN: ?Apr 17, 1966 (56 y.o. [name] ?Primary Care Treyshawn Muldrew: [name] ?Other Clinician: ?Referring Finnlee Guarnieri: ?Treating Theodoro Koval/Extender: [name] ?[name] ?Weeks in Treatment: 1 ?Multidisciplinary Care Plan reviewed with physician ?Active Inactive ?Nutrition ?Nursing Diagnoses: ?Impaired glucose control: actual or potential ?Potential for alteratiion in Nutrition/Potential for imbalanced nutrition ?Goals: ?Patient/caregiver will maintain therapeutic glucose control ?Date Initiated: 01/08/2022 ?Target Resolution Date: 02/05/2022 ?Goal Status: Active ?Interventions: ?Assess HgA1c results as ordered upon admission and  as needed ?Assess patient nutrition upon admission and as needed per policy ?Provide education on elevated blood sugars and impact on wound healing ?Treatment Activities: ?Dietary management education, guidance and counseling : 01/08/2022 ?Patient referred to Primary Care Physician for further nutritional evaluation : 01/08/2022 ?Notes: ?Pressure ?Nursing Diagnoses: ?Knowledge deficit related to causes and risk factors for pressure ulcer development ?Knowledge deficit related to management of pressures ulcers ?Potential for impaired tissue integrity related to pressure, friction, moisture, and shear ?Goals: ?Patient/caregiver will verbalize understanding of pressure ulcer management ?Date Initiated: 01/08/2022 ?Target Resolution Date: 02/05/2022 ?Goal Status: Active ?Interventions: ?Assess: immobility, friction, shearing, incontinence upon admission and as needed ?Assess offloading mechanisms upon admission and as needed ?Assess potential for pressure ulcer upon admission and as needed ?Notes: ?Wound/Skin Impairment ?Nursing Diagnoses: ?Impaired tissue integrity ?Knowledge deficit related to ulceration/compromised skin integrity ?Goals: ?Patient/caregiver will verbalize understanding of skin care regimen ?Date Initiated: 01/08/2022 ?Target Resolution Date: 02/05/2022 ?Goal Status: Active ?Ulcer/skin breakdown will have a volume reduction of 30% by week 4 ?Date Initiated: 01/08/2022 ?Target Resolution Date: 02/05/2022 ?Goal Status: Active ?Interventions: ?Assess patient/caregiver ability to obtain necessary supplies ?Assess patient/caregiver ability to perform ulcer/skin care regimen upon admission and as needed ?Assess ulceration(s) every visit ?Provide education on ulcer and skin care ?Treatment Activities: ?Skin care regimen initiated : 01/08/2022 ?Topical wound management initiated : 01/08/2022 ?Notes: ?Electronic Signature(s) ?Signed: 01/21/2022 6:28:42 PM By: Baruch Gouty RN, BSN ?Entered By: Baruch Gouty on 01/21/2022  13:08:25 ?-------------------------------------------------------------------------------- ?Negative Pressure Wound Therapy Maintenance (NPWT) Details ?Patient Name: ?Date of Service: ?[place] NN, [place] 5/17/

## 2022-01-21 NOTE — Assessment & Plan Note (Addendum)
Chronic - pt reports Trazodone does not work, Careers information officer is at pharmacy from previous provider that he reports he has not picked up yet. Called [facility] and confirmed RX not picked up, RX cancelled at 5:10pm. Has taken Ambien in the past that has worked but recently took a friend's that did not work. Will send '10mg'$  Ambien, advised to let me know if not working.  ?

## 2022-01-22 ENCOUNTER — Ambulatory Visit: Payer: Medicare HMO | Admitting: *Deleted

## 2022-01-22 NOTE — Chronic Care Management (AMB) (Signed)
  Care Management   Outreach Note  01/22/2022 Name: [name] MRN: [id number] DOB: 1965/10/06  Referred by: [name], NP Reason for referral : Care Coordination ([facility], HHPT)   Spoke with patient.   States he did attend virtual appointment with provider yesterday.  States he did try the prescribed Ambien without success (admits to taking a few doses spread throughout the night); only slept for about 2 hours last night.  Has not started Buspar.  Encouraged patient to take medications only as prescribed, not more than prescription is for.  Spoke with Hinton Dyer from [facility] yesterday and she stated HHPT plans to visit on Friday, 5/19 and should contact patient today to arrange appointment. Patient updated.  Follow Up Plan:  The care management team will reach out to the patient again over the next 7 days.  to verify receipt of HHPT.  Hubert Azure RN, MSN RN Care Management Coordinator  [place] [phone] [email or web address]

## 2022-01-26 DIAGNOSIS — Z89512 Acquired absence of left leg below knee: Secondary | ICD-10-CM | POA: Diagnosis not present

## 2022-01-26 DIAGNOSIS — Z89511 Acquired absence of right leg below knee: Secondary | ICD-10-CM | POA: Diagnosis not present

## 2022-01-27 ENCOUNTER — Telehealth: Payer: Medicare HMO

## 2022-01-28 ENCOUNTER — Encounter (HOSPITAL_BASED_OUTPATIENT_CLINIC_OR_DEPARTMENT_OTHER): Payer: Medicare HMO | Admitting: General Surgery

## 2022-01-28 DIAGNOSIS — Z89511 Acquired absence of right leg below knee: Secondary | ICD-10-CM | POA: Diagnosis not present

## 2022-01-28 DIAGNOSIS — Z89512 Acquired absence of left leg below knee: Secondary | ICD-10-CM | POA: Diagnosis not present

## 2022-01-28 DIAGNOSIS — L89154 Pressure ulcer of sacral region, stage 4: Secondary | ICD-10-CM | POA: Diagnosis not present

## 2022-01-29 ENCOUNTER — Ambulatory Visit: Payer: Self-pay | Admitting: *Deleted

## 2022-01-29 NOTE — Chronic Care Management (AMB) (Signed)
  Care Management   Outreach Note  01/29/2022 Name: [name] MRN: [id number] DOB: Sep 24, 1965  Referred by: [name], NP Reason for referral : Care Coordination (hhpt)   Successful telephone outreach to patient.  Reports HHPT  has been to start therapy.  Follow Up Plan:  No further follow up required: as HHPT  is now active with patient.  Patient reporting no further needs at this time.  Hubert Azure RN, MSN RN Care Management Coordinator  [facility] [phone]  [email or web address]

## 2022-01-29 NOTE — Patient Instructions (Signed)
Visit Information  Thank you for allowing me to share the care management and care coordination services that are available to you as part of your health plan and services through your primary care provider and medical home. Please reach out to me at [phone] if the care management/care coordination team may be of assistance to you in the future.   Audre Cenci RN, MSN RN Care Management Coordinator  [facility] [phone] [email or web address]  

## 2022-01-29 NOTE — Progress Notes (Signed)
EYOEL, THROGMORTON ([id number]) Visit Report for 01/28/2022 Chief Complaint Document Details Patient Name: Date of Service: [place], [place] 01/28/2022 2:00 PM Medical Record Number: [id number] Patient Account Number: [id number] Date of Birth/Sex: Treating RN: 1965/09/17 (56 y.o. [name] Primary Care Provider: [name] Other Clinician: Referring Provider: Treating Provider/Extender: [name] in Treatment: 2 Information Obtained from: Patient Chief Complaint Patient is at the clinic for treatment of an open pressure ulcer Electronic Signature(s) Signed: 01/28/2022 3:36:18 PM By: [name] MD FACS Entered By: [name] on 01/28/2022 15:36:18 -------------------------------------------------------------------------------- Debridement Details Patient Name: Date of Service: [name], [place] 01/28/2022 2:00 PM Medical Record Number: [id number] Patient Account Number: [id number] Date of Birth/Sex: Treating RN: Jan 20, 1966 (56 y.o. [name] Primary Care Provider: [name] Other Clinician: Referring Provider: Treating Provider/Extender: [name] in Treatment: 2 Debridement Performed for Assessment: Wound #1 Sacrum Performed By: Physician [name], MD Debridement Type: Debridement Level of Consciousness (Pre-procedure): Awake and Alert Pre-procedure Verification/Time Out Yes - 15:00 Taken: Start Time: 15:01 Pain Control: Lidocaine 5% topical ointment T Area Debrided (L x W): otal 2.5 (cm) x 1.5 (cm) = 3.75 (cm) Tissue and other material debrided: Viable, Non-Viable, Slough, Subcutaneous, Slough Level: Skin/Subcutaneous Tissue Debridement Description: Excisional Instrument: Curette Bleeding: Minimum Hemostasis Achieved: Pressure Procedural Pain: 2 Post Procedural Pain: 1 Response to Treatment: Procedure was tolerated well Level of Consciousness (Post- Awake and  Alert procedure): Post Debridement Measurements of Total Wound Length: (cm) 2.5 Stage: Category/Stage IV Width: (cm) 1.5 Depth: (cm) 1.4 Volume: (cm) 4.123 Character of Wound/Ulcer Post Debridement: Improved Post Procedure Diagnosis Same as Pre-procedure Electronic Signature(s) Signed: 01/28/2022 6:00:25 PM By: [name] MD FACS Signed: 01/29/2022 6:06:15 PM By: Baruch Gouty RN, BSN Entered By: Baruch Gouty on 01/28/2022 15:03:19 -------------------------------------------------------------------------------- HPI Details Patient Name: Date of Service: [name], [place] 01/28/2022 2:00 PM Medical Record Number: [id number] Patient Account Number: [id number] Date of Birth/Sex: Treating RN: 1966-05-13 (56 y.o. [name] Primary Care Provider: [name] Other Clinician: Referring Provider: Treating Provider/Extender: [name] in Treatment: 2 History of Present Illness HPI Description: ADMISSION 01/08/2022 This is a 56 year old male with a past medical history notable for type 2 diabetes mellitus (last A1c was 8.6) congestive heart failure, hypertension, chronic pain, and bilateral below-knee amputations. His most recent amputation was in November 2022. While he was in the hospital, he developed a sacral pressure ulcer. He was subsequently in a skilled nursing facility for some time. He was discharged with home health and had been in a wound VAC, but was then admitted to the hospital last week when the wound appeared to be worsening. Apparently the periwound skin was macerated and the device that he had been using was leaking quite a bit. Evaluation while in the hospital included a consultation with infectious disease who did not think he had any evidence of osteomyelitis, plastic surgery who felt that he was not an appropriate flap candidate (their note also states that he is not interested in a flap) and wound care who took him  out of the wound VAC and initiated wet-to-dry dressing changes. He has a new wound VAC from [facility] on order, anticipated delivery today. The wound itself is fairly small and isolated to the sacrum. There is muscle exposed. No bone is appreciated but it is palpable beneath the surface. The muscle itself is bit pale and there is heaped up epibole around the wound edges. No significant odor  or drainage. 01/14/2022: His wound VAC was not initiated until this past Friday. He has not had any issues with the VAC but today we found that the bridge foam was applied directly to the skin rather than over a layer of adhesive drape. His home health nurse also requested that we consider applying silver collagen to the wound bed in addition to the VAC. There is still a little bit of heaped up senescent skin around the wound periphery. No significant change to the wound dimensions. 01/21/2022: No significant change to the wound dimensions. The senescent skin has not reaccumulated. The periwound skin remains a bit macerated but without any obvious breakdown. The wound surface itself has a shiny appearance with a little bit of slough accumulation; no true robust granulation tissue at this time. 01/28/2022: The wound is about the same size but a little bit shallower. There is a little bit of senescent skin reaccumulation at the cranial aspect. The periwound skin is red but not macerated and without any tissue breakdown. The wound still does not have the most robust surface. There is a bit of slough accumulation. Electronic Signature(s) Signed: 01/28/2022 3:38:11 PM By: [name] MD FACS Entered By: [name] on 01/28/2022 15:38:11 -------------------------------------------------------------------------------- Physical Exam Details Patient Name: Date of Service: [name], [place] 01/28/2022 2:00 PM Medical Record Number: [id number] Patient Account Number: [id number] Date of Birth/Sex: Treating RN: July 31, 1966  (56 y.o. [name] Primary Care Provider: [name] Other Clinician: Referring Provider: Treating Provider/Extender: [name] in Treatment: 2 Constitutional . . . . No acute distress. Respiratory Normal work of breathing on room air. Notes 01/28/2022: The wound is about the same size but a little bit shallower. There is a little bit of senescent skin reaccumulation at the cranial aspect. The periwound skin is red but not macerated and without any tissue breakdown. The wound still does not have the most robust surface. There is a bit of slough accumulation. Electronic Signature(s) Signed: 01/28/2022 3:38:43 PM By: [name] MD FACS Entered By: [name] on 01/28/2022 15:38:43 -------------------------------------------------------------------------------- Physician Orders Details Patient Name: Date of Service: [name], [place] 01/28/2022 2:00 PM Medical Record Number: [id number] Patient Account Number: [id number] Date of Birth/Sex: Treating RN: 25-Aug-1966 (56 y.o. [name] Primary Care Provider: [name] Other Clinician: Referring Provider: Treating Provider/Extender: [name] in Treatment: 2 Verbal / Phone Orders: No Diagnosis Coding ICD-10 Coding Code Description L89.154 Pressure ulcer of sacral region, stage 4 Z89.512 Acquired absence of left leg below knee Z89.511 Acquired absence of right leg below knee Follow-up Appointments ppointment in 1 week. - Dr. Lucienne Capers with [facility] Return A Wed. 5/31 @ 3:30 pm Bathing/ Shower/ Hygiene May shower with protection but do not get wound dressing(s) wet. Negative Presssure Wound Therapy Wound #1 Sacrum Wound Vac to wound continuously at 138m/hg pressure Black Foam Off-Loading Gel mattress overlay (Group 1) Turn and reposition every 2 hours Home Health Dressing changes to be completed [facility] /  Wednesday / Friday except [facility] - Be sure no sponge directly on the skin, place drape under the bridge Other Home Health Orders/Instructions: - [facility] Wound Treatment Wound #1 - Sacrum Prim Dressing: Promogran Prisma Matrix, 4.34 (sq in) (silver collagen) 3 x Per Week/30 Days ary Discharge Instructions: lay in base of wound under the VAC sponge Prim Dressing: VAC 3 x Per Week/30 Days ary Discharge Instructions: be sure  to place sponge in undermining and drape under the bridge Patient Medications llergies: Bactrim, Ceprotin ([place]), ciprofloxacin, Levaquin A Notifications Medication Indication Start End prior to debridement 01/28/2022 lidocaine DOSE topical 5 % ointment - ointment topical Electronic Signature(s) Signed: 01/28/2022 6:00:25 PM By: [name] MD FACS Entered By: [name] on 01/28/2022 15:38:54 -------------------------------------------------------------------------------- Problem List Details Patient Name: Date of Service: [name], [place] 01/28/2022 2:00 PM Medical Record Number: [id number] Patient Account Number: [id number] Date of Birth/Sex: Treating RN: 03/10/1966 (56 y.o. [name] Primary Care Provider: [name] Other Clinician: Referring Provider: Treating Provider/Extender: [name] in Treatment: 2 Active Problems ICD-10 Encounter Code Description Active Date MDM Diagnosis L89.154 Pressure ulcer of sacral region, stage 4 01/08/2022 No Yes Z89.512 Acquired absence of left leg below knee 01/08/2022 No Yes Z89.511 Acquired absence of right leg below knee 01/08/2022 No Yes Inactive Problems Resolved Problems Electronic Signature(s) Signed: 01/28/2022 3:35:41 PM By: [name] MD FACS Entered By: [name] on 01/28/2022 15:35:41 -------------------------------------------------------------------------------- Progress Note  Details Patient Name: Date of Service: [name], [name] 01/28/2022 2:00 PM Medical Record Number: [id number] Patient Account Number: [id number] Date of Birth/Sex: Treating RN: 04/19/1966 (56 y.o. [name] Primary Care Provider: [name] Other Clinician: Referring Provider: Treating Provider/Extender: [name] in Treatment: 2 Subjective Chief Complaint Information obtained from Patient Patient is at the clinic for treatment of an open pressure ulcer History of Present Illness (HPI) ADMISSION 01/08/2022 This is a 57 year old male with a past medical history notable for type 2 diabetes mellitus (last A1c was 8.6) congestive heart failure, hypertension, chronic pain, and bilateral below-knee amputations. His most recent amputation was in November 2022. While he was in the hospital, he developed a sacral pressure ulcer. He was subsequently in a skilled nursing facility for some time. He was discharged with home health and had been in a wound VAC, but was then admitted to the hospital last week when the wound appeared to be worsening. Apparently the periwound skin was macerated and the device that he had been using was leaking quite a bit. Evaluation while in the hospital included a consultation with infectious disease who did not think he had any evidence of osteomyelitis, plastic surgery who felt that he was not an appropriate flap candidate (their note also states that he is not interested in a flap) and wound care who took him out of the wound VAC and initiated wet-to-dry dressing changes. He has a new wound VAC from [facility] on order, anticipated delivery today. The wound itself is fairly small and isolated to the sacrum. There is muscle exposed. No bone is appreciated but it is palpable beneath the surface. The muscle itself is bit pale and there is heaped up epibole around the wound edges. No significant odor or drainage. 01/14/2022: His wound  VAC was not initiated until this past Friday. He has not had any issues with the VAC but today we found that the bridge foam was applied directly to the skin rather than over a layer of adhesive drape. His home health nurse also requested that we consider applying silver collagen to the wound bed in addition to the VAC. There is still a little bit of heaped up senescent skin around the wound periphery. No significant change to the wound dimensions. 01/21/2022: No significant change to the wound dimensions. The senescent skin has not reaccumulated. The periwound skin remains a bit macerated but without any obvious breakdown. The wound  surface itself has a shiny appearance with a little bit of slough accumulation; no true robust granulation tissue at this time. 01/28/2022: The wound is about the same size but a little bit shallower. There is a little bit of senescent skin reaccumulation at the cranial aspect. The periwound skin is red but not macerated and without any tissue breakdown. The wound still does not have the most robust surface. There is a bit of slough accumulation. Patient History Information obtained from Patient, Caregiver, Chart. Family History Cancer - Father, Heart Disease - Father,Paternal Grandparents, Hypertension - Father, No family history of Diabetes, Hereditary Spherocytosis, Kidney Disease, Lung Disease, Seizures, Stroke, Thyroid Problems, Tuberculosis. Social History Never smoker, Marital Status - Divorced, Alcohol Use - Never, Drug Use - No History, Caffeine Use - Moderate - coffee. Medical History Eyes Denies history of Cataracts, Glaucoma, Optic Neuritis Ear/Nose/Mouth/Throat Denies history of Chronic sinus problems/congestion, Middle ear problems Cardiovascular Patient has history of Congestive Heart Failure, Hypertension Endocrine Patient has history of Type II Diabetes Denies history of Type I Diabetes Genitourinary Denies history of End Stage Renal  Disease Integumentary (Skin) Denies history of History of Burn Musculoskeletal Patient has history of Osteomyelitis - S1 and coccyx Oncologic Denies history of Received Chemotherapy, Received Radiation Psychiatric Patient has history of Confinement Anxiety Denies history of Anorexia/bulimia Hospitalization/Surgery History - bil BKA. Medical A Surgical History Notes nd Respiratory pulmonary nodules Genitourinary renal mass, urinary hesitancy Musculoskeletal discitis of thoracic region Objective Constitutional No acute distress. Vitals Time Taken: 2:35 PM, Temperature: 97.6 F, Pulse: 66 bpm, Respiratory Rate: 18 breaths/min, Blood Pressure: 140/79 mmHg. Respiratory Normal work of breathing on room air. General Notes: 01/28/2022: The wound is about the same size but a little bit shallower. There is a little bit of senescent skin reaccumulation at the cranial aspect. The periwound skin is red but not macerated and without any tissue breakdown. The wound still does not have the most robust surface. There is a bit of slough accumulation. Integumentary (Hair, Skin) Wound #1 status is Open. Original cause of wound was Pressure Injury. The date acquired was: 07/29/2021. The wound has been in treatment 2 weeks. The wound is located on the Sacrum. The wound measures 2.5cm length x 1.5cm width x 1.4cm depth; 2.945cm^2 area and 4.123cm^3 volume. There is muscle and Fat Layer (Subcutaneous Tissue) exposed. There is no tunneling noted, however, there is undermining starting at 12:00 and ending at 12:00 with a maximum distance of 1.6cm. There is a medium amount of serous drainage noted. The wound margin is well defined and not attached to the wound base. There is medium (34-66%) pink, pale granulation within the wound bed. There is a medium (34-66%) amount of necrotic tissue within the wound bed including Adherent Slough. 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 There was a Excisional Skin/Subcutaneous Tissue Debridement with a total area of 3.75 sq cm performed by [name], MD. With the following instrument(s): Curette to remove Viable and Non-Viable tissue/material. Material removed includes Subcutaneous Tissue and Slough and after achieving pain control using Lidocaine 5% topical ointment. No specimens were taken. A time out was conducted at 15:00, prior to the start of the procedure. A Minimum amount of bleeding was controlled with Pressure. The procedure was tolerated well with a pain level of 2  throughout and a pain level of 1 following the procedure. Post Debridement Measurements: 2.5cm length x 1.5cm width x 1.4cm depth; 4.123cm^3 volume. Post debridement Stage noted as Category/Stage IV. Character of Wound/Ulcer Post Debridement is improved. Post procedure Diagnosis Wound #1: Same as Pre-Procedure Plan Follow-up Appointments: Return Appointment in 1 week. - Dr. Lucienne Capers with [facility]. 5/31 @ 3:30 pm Bathing/ Shower/ Hygiene: May shower with protection but do not get wound dressing(s) wet. Negative Presssure Wound Therapy: Wound #1 Sacrum: Wound Vac to wound continuously at 137m/hg pressure Black Foam Off-Loading: Gel mattress overlay (Group 1) Turn and reposition every 2 hours Home Health: Dressing [facility] [facility] / Wednesday / Friday except [facility] Be sure no sponge directly on the skin, place drape under the bridge Other Home Health Orders/Instructions: -[facility] DenmarkThe following medication(s) was prescribed: lidocaine topical 5 % ointment ointment topical for prior to debridement was prescribed at facility WOUND #1: - Sacrum Wound Laterality: Prim Dressing:  Promogran Prisma Matrix, 4.34 (sq in) (silver collagen) 3 x Per Week/30 Days ary Discharge Instructions: lay in base of wound under the VAC sponge Prim Dressing: VAC 3 x Per Week/30 Days ary Discharge Instructions: be sure to place sponge in undermining and drape under the bridge 01/28/2022: The wound is about the same size but a little bit shallower. There is a little bit of senescent skin reaccumulation at the cranial aspect. The periwound skin is red but not macerated and without any tissue breakdown. The wound still does not have the most robust surface. There is a bit of slough accumulation. I used a curette to debride slough from the wound as well as the perimeter accumulated senescent tissue. I also debrided the entire surface of the wound to try and stimulate the healing process. We will continue using Prisma silver collagen in the wound bed with [facility] wound VAC. We will continue to monitor the periwound skin for any signs of breakdown. Follow-up in 1 week. Electronic Signature(s) Signed: 01/28/2022 3:41:03 PM By: CFredirick MaudlinMD FACS Entered By: CFredirick Maudlinon 01/28/2022 15:41:03 -------------------------------------------------------------------------------- HxROS Details Patient Name: Date of Service: [place]/24/2023 2:00 PM Medical Record Number: [id number] Account Number: [id number] of Birth/Sex: Treating RN: [birth year](56y.o. MErnestene MentionPrimary Care Provider: HJeanie SewerOther Clinician: Referring Provider: Treating Provider/Extender: CChristie Nottinghamin Treatment: 2 Information Obtained From Patient Caregiver Chart Eyes Medical History: Negative for: Cataracts; Glaucoma; Optic Neuritis Ear/Nose/Mouth/Throat Medical History: Negative for: Chronic sinus problems/congestion; Middle ear problems Respiratory Medical History: Past Medical History Notes: pulmonary nodules Cardiovascular Medical  History: Positive for: Congestive Heart Failure; Hypertension Endocrine Medical History: Positive for: Type II Diabetes Negative for: Type I Diabetes Time with diabetes: since 2004 Treated with: Insulin Blood sugar tested every day: Yes Tested : 4 times per day Genitourinary Medical History: Negative for: End Stage Renal Disease Past Medical History Notes: renal mass, urinary hesitancy Integumentary (Skin) Medical History: Negative for: History of Burn Musculoskeletal Medical History: Positive for: Osteomyelitis - S1 and coccyx Past Medical History Notes: discitis of thoracic region Oncologic Medical History: Negative for: Received Chemotherapy; Received Radiation Psychiatric Medical History: Positive for: Confinement Anxiety Negative for: Anorexia/bulimia Immunizations Pneumococcal Vaccine: Received Pneumococcal Vaccination: No Implantable Devices No devices added Hospitalization / Surgery History Type of Hospitalization/Surgery bil BKA Family and Social History Cancer: Yes - Father; Diabetes: No; Heart Disease: Yes - Father,Paternal  Grandparents; Hereditary Spherocytosis: No; Hypertension: Yes - Father; Kidney Disease: No; Lung Disease: No; Seizures: No; Stroke: No; Thyroid Problems: No; Tuberculosis: No; Never smoker; Marital Status - Divorced; Alcohol Use: Never; Drug Use: No History; Caffeine Use: Moderate - coffee; Financial Concerns: No; Food, Clothing or Shelter Needs: No; Support System Lacking: No; Transportation Concerns: Yes - hurts to transfer Electronic Signature(s) Signed: 01/28/2022 6:00:25 PM By: [name] MD FACS Signed: 01/29/2022 6:06:15 PM By: Baruch Gouty RN, BSN Entered By: [name] on 01/28/2022 15:38:16 -------------------------------------------------------------------------------- [place] Details Patient Name: Date of Service: [name], [place] 01/28/2022 Medical Record Number: [id number] Patient Account Number:  [id number] Date of Birth/Sex: Treating RN: [birth year] (56 y.o. [name] Primary Care Provider: [name] Other Clinician: Referring Provider: Treating Provider/Extender: [name] in Treatment: 2 Diagnosis Coding ICD-10 Codes Code Description [phone] Pressure ulcer of sacral region, stage 4 Z89.512 Acquired absence of left leg below knee Z89.511 Acquired absence of right leg below knee Facility Procedures CPT4 Code: [id number] Description: 69450 - DEB SUBQ TISSUE 20 SQ CM/< ICD-10 Diagnosis Description L89.154 Pressure ulcer of sacral region, stage 4 Modifier: Quantity: 1 CPT4 Code: [id number] Description: 34917 - WOUND VAC-50 SQ CM OR LESS Modifier: 27 Quantity: 1 Physician Procedures : CPT4 Code Description Modifier [id number] 79480 - WC PHYS LEVEL 3 - EST PT 25 ICD-10 Diagnosis Description L89.154 Pressure ulcer of sacral region, stage 4 Z89.512 Acquired absence of left leg below knee Z89.511 Acquired absence of right leg below knee Quantity: 1 Electronic Signature(s) Signed: 01/28/2022 3:41:18 PM By: [name] MD FACS Entered By: [name] on 01/28/2022 15:41:17

## 2022-01-29 NOTE — Progress Notes (Signed)
[name], [name] ([id number]) Visit Report for 01/28/2022 Arrival Information Details Patient Name: Date of Service: [name], [name] 01/28/2022 2:00 PM Medical Record Number: [id number] Patient Account Number: [id number] Date of Birth/Sex: Treating RN: 01/25/1966 (56 y.o. [name] Primary Care [name]: [name] Other Clinician: Referring [name]: Treating [name]/Extender: [name] in Treatment: 2 Visit Information History Since Last Visit Added or deleted any medications: No Patient Arrived: Wheel Chair Any new allergies or adverse reactions: No Arrival Time: 14:34 Had a fall or experienced change in No Accompanied By: girlfriend activities of daily living that may affect Transfer Assistance: Transfer Board risk of falls: Patient Identification Verified: Yes Signs or symptoms of abuse/neglect since last visito No Secondary Verification Process Completed: Yes Hospitalized since last visit: No Patient Requires Transmission-Based Precautions: No Implantable device outside of the clinic excluding No Patient Has Alerts: No cellular tissue based products placed in the [name] since last visit: Has Dressing in Place as Prescribed: Yes Pain Present Now: Yes Electronic Signature(s) Signed: 01/28/2022 3:12:02 PM By: Sandre Kitty Entered By: Sandre Kitty on 01/28/2022 14:35:24 -------------------------------------------------------------------------------- Encounter Discharge Information Details Patient Name: Date of Service: [name], [name] 01/28/2022 2:00 PM Medical Record Number: [id number] Patient Account Number: [id number] Date of Birth/Sex: Treating RN: [birth year] (56 y.o. [name] Primary Care [name]: [name] Other Clinician: Referring [name]: Treating [name]/Extender: [name] in Treatment: 2 Encounter Discharge Information Items Post Procedure  Vitals Discharge Condition: Stable Temperature (F): 97.6 Ambulatory Status: Wheelchair Pulse (bpm): 66 Discharge Destination: Home Respiratory Rate (breaths/min): 18 Transportation: Private Auto Blood Pressure (mmHg): 140/79 Accompanied By: girlfriend Schedule Follow-up Appointment: Yes Clinical Summary of Care: Patient Declined Electronic Signature(s) Signed: 01/29/2022 6:06:15 PM By: Baruch Gouty RN, BSN Entered By: Baruch Gouty on 01/28/2022 15:32:20 -------------------------------------------------------------------------------- Lower Extremity Assessment Details Patient Name: Date of Service: [name], [name] 01/28/2022 2:00 PM Medical Record Number: [id number] Patient Account Number: [id number] Date of Birth/Sex: Treating RN: 10-16-1965 (56 y.o. [name] Primary Care [name]: [name] Other Clinician: Referring Sheretta Grumbine: Treating [name]/Extender: [name] in Treatment: 2 Electronic Signature(s) Signed: 01/29/2022 6:06:15 PM By: Baruch Gouty RN, BSN Entered By: Baruch Gouty on 01/28/2022 14:44:24 -------------------------------------------------------------------------------- Multi Wound Chart Details Patient Name: Date of Service: [name], [place] 01/28/2022 2:00 PM Medical Record Number: [id number] Patient Account Number: [id number] Date of Birth/Sex: Treating RN: [birth year] (56 y.o. [name] Primary Care [name]: [name] Other Clinician: Referring [name]: Treating [name]/Extender: [name] in Treatment: 2 Vital Signs Height(in): Pulse(bpm): 54 Weight(lbs): Blood Pressure(mmHg): 140/79 Body Mass Index(BMI): Temperature(F): 97.6 Respiratory Rate(breaths/min): 18 Photos: [N/A:N/A] Sacrum N/A N/A Wound Location: Pressure Injury N/A N/A Wounding Event: Pressure Ulcer N/A N/A Primary Etiology: Congestive Heart Failure, N/A  N/A Comorbid History: Hypertension, Type II Diabetes, Osteomyelitis, Confinement Anxiety 07/29/2021 N/A N/A Date Acquired: 2 N/A N/A Weeks of Treatment: Open N/A N/A Wound Status: No N/A N/A Wound Recurrence: 2.5x1.5x1.4 N/A N/A Measurements L x W x D (cm) 2.945 N/A N/A A (cm) : rea 4.123 N/A N/A Volume (cm) : 50.00% N/A N/A % Reduction in A rea: 36.40% N/A N/A % Reduction in Volume: 12 Starting Position 1 (o'clock): 12 Ending Position 1 (o'clock): 1.6 Maximum Distance 1 (cm): Yes N/A N/A Undermining: Category/Stage IV N/A N/A Classification: Medium N/A N/A Exudate A mount: Serous N/A N/A Exudate Type: amber N/A N/A Exudate Color: Well defined, not attached N/A N/A Wound Margin: Medium (34-66%) N/A N/A Granulation  Amount: Pink, Pale N/A N/A Granulation Quality: Medium (34-66%) N/A N/A Necrotic Amount: Fat Layer (Subcutaneous Tissue): Yes N/A N/A Exposed Structures: Muscle: Yes Fascia: No Tendon: No Joint: No Bone: No Small (1-33%) N/A N/A Epithelialization: Debridement - Excisional N/A N/A Debridement: Pre-procedure Verification/Time Out 15:00 N/A N/A Taken: Lidocaine 5% topical ointment N/A N/A Pain Control: Subcutaneous, Slough N/A N/A Tissue Debrided: Skin/Subcutaneous Tissue N/A N/A Level: 3.75 N/A N/A Debridement A (sq cm): rea Curette N/A N/A Instrument: Minimum N/A N/A Bleeding: Pressure N/A N/A Hemostasis A chieved: 2 N/A N/A Procedural Pain: 1 N/A N/A Post Procedural Pain: Procedure was tolerated well N/A N/A Debridement Treatment Response: 2.5x1.5x1.4 N/A N/A Post Debridement Measurements L x W x D (cm) 4.123 N/A N/A Post Debridement Volume: (cm) Category/Stage IV N/A N/A Post Debridement Stage: Debridement N/A N/A Procedures Performed: Negative Pressure Wound Therapy Maintenance (NPWT) Treatment Notes Wound #1 (Sacrum) Cleanser Peri-Wound Care Topical Primary Dressing Promogran Prisma Matrix, 4.34 (sq in)  (silver collagen) Discharge Instruction: lay in base of wound under the VAC sponge VAC Discharge Instruction: be sure to place sponge in undermining and drape under the bridge Secondary Dressing Secured With Compression Wrap Compression Stockings Add-Ons Electronic Signature(s) Signed: 01/28/2022 3:35:57 PM By: [name] MD FACS Signed: 01/29/2022 6:06:15 PM By: Baruch Gouty RN, BSN Entered By: [name] on 01/28/2022 15:35:57 -------------------------------------------------------------------------------- [place] Details Patient Name: Date of Service: [name], [name] [place] 01/28/2022 2:00 PM Medical Record Number: [id number] Patient Account Number: [id number] Date of Birth/Sex: Treating RN: 1966-06-07 (56 y.o. Ulyses Amor, Vaughan Basta Primary Care Ethylene Reznick: [name] Other Clinician: Referring Kamyah Wilhelmsen: Treating Karlye Ihrig/Extender: [name] in Treatment: 2 Multidisciplinary Care Plan reviewed with physician Active Inactive Nutrition Nursing Diagnoses: Impaired glucose control: actual or potential Potential for alteratiion in Nutrition/Potential for imbalanced nutrition Goals: Patient/caregiver will maintain therapeutic glucose control Date Initiated: 01/08/2022 Target Resolution Date: 02/05/2022 Goal Status: Active Interventions: Assess HgA1c results as ordered upon admission and as needed Assess patient nutrition upon admission and as needed per policy Provide education on elevated blood sugars and impact on wound healing Treatment Activities: Dietary management education, guidance and counseling : 01/08/2022 Patient referred to Primary Care Physician for further nutritional evaluation : 01/08/2022 Notes: Pressure Nursing Diagnoses: Knowledge deficit related to causes and risk factors for pressure ulcer development Knowledge deficit related to management of pressures ulcers Potential for impaired tissue  integrity related to pressure, friction, moisture, and shear Goals: Patient/caregiver will verbalize understanding of pressure ulcer management Date Initiated: 01/08/2022 Target Resolution Date: 02/05/2022 Goal Status: Active Interventions: Assess: immobility, friction, shearing, incontinence upon admission and as needed Assess offloading mechanisms upon admission and as needed Assess potential for pressure ulcer upon admission and as needed Notes: Wound/Skin Impairment Nursing Diagnoses: Impaired tissue integrity Knowledge deficit related to ulceration/compromised skin integrity Goals: Patient/caregiver will verbalize understanding of skin care regimen Date Initiated: 01/08/2022 Target Resolution Date: 02/05/2022 Goal Status: Active Ulcer/skin breakdown will have a volume reduction of 30% by week 4 Date Initiated: 01/08/2022 Target Resolution Date: 02/05/2022 Goal Status: Active Interventions: Assess patient/caregiver ability to obtain necessary supplies Assess patient/caregiver ability to perform ulcer/skin care regimen upon admission and as needed Assess ulceration(s) every visit Provide education on ulcer and skin care Treatment Activities: Skin care regimen initiated : 01/08/2022 Topical wound management initiated : 01/08/2022 Notes: Electronic Signature(s) Signed: 01/29/2022 6:06:15 PM By: Baruch Gouty RN, BSN Entered By: Baruch Gouty on 01/28/2022 14:47:52 -------------------------------------------------------------------------------- Negative Pressure Wound Therapy Maintenance (NPWT) Details Patient Name: Date of Service:  [facility] [place], [place] 01/28/2022 2:00 PM Medical Record Number: [id number] Patient Account Number: [id number] Date of Birth/Sex: Treating RN: 07/02/1966 (56 y.o. [name] Primary Care Novi Calia: [name] Other Clinician: Referring Alexianna Nachreiner: Treating Kaziyah Parkison/Extender: [name] in Treatment: 2 NPWT  Maintenance Performed for: Wound #1 Sacrum Performed By: Baruch Gouty, RN Type: VAC System Coverage Size (sq cm): 3.75 Pressure Type: Constant Pressure Setting: 125 mmHG Drain Type: None Primary Contact: Other : prisma Sponge/Dressing Type: Foam, Black Date Initiated: 01/09/2022 Dressing Removed: No Quantity of Sponges/Gauze Removed: pt removed at home Canister Changed: No Dressing Reapplied: No Quantity of Sponges/Gauze Inserted: 2 Respones T Treatment: o good Days On NPWT : 20 Post Procedure Diagnosis Same as Pre-procedure Electronic Signature(s) Signed: 01/29/2022 6:06:15 PM By: Baruch Gouty RN, BSN Entered By: Baruch Gouty on 01/28/2022 15:06:16 -------------------------------------------------------------------------------- Pain Assessment Details Patient Name: Date of Service: [name], [name] [place] 01/28/2022 2:00 PM Medical Record Number: [id number] Patient Account Number: [id number] Date of Birth/Sex: Treating RN: 1965-11-09 (56 y.o. [name] Primary Care Zykira Matlack: [name] Other Clinician: Referring Dylanie Quesenberry: Treating Aleeya Veitch/Extender: [name] in Treatment: 2 Active Problems Location of Pain Severity and Description of Pain Patient Has Paino Yes Site Locations Rate the pain. Rate the pain. Current Pain Level: 7 Pain Management and Medication Current Pain Management: Electronic Signature(s) Signed: 01/28/2022 3:12:02 PM By: Sandre Kitty Signed: 01/29/2022 6:06:15 PM By: Baruch Gouty RN, BSN Entered By: Sandre Kitty on 01/28/2022 14:36:13 -------------------------------------------------------------------------------- Patient/Caregiver Education Details Patient Name: Date of Service: [name], [name] 5/24/2023andnbsp2:00 PM Medical Record Number: [id number] Patient Account Number: [id number] Date of Birth/Gender: Treating RN: [birth year] (56 y.o. [name] Primary Care Physician:  [name] Other Clinician: Referring Physician: Treating Physician/Extender: [name] in Treatment: 2 Education Assessment Education Provided To: Patient Education Topics Provided Elevated Blood Sugar/ Impact on Healing: Methods: Explain/Verbal Responses: Reinforcements needed, State content correctly Pressure: Methods: Explain/Verbal Responses: Reinforcements needed, State content correctly Wound/Skin Impairment: Methods: Explain/Verbal Responses: Reinforcements needed, State content correctly Electronic Signature(s) Signed: 01/29/2022 6:06:15 PM By: Baruch Gouty RN, BSN Entered By: Baruch Gouty on 01/28/2022 14:48:30 -------------------------------------------------------------------------------- Wound Assessment Details Patient Name: Date of Service: [name], [name] 01/28/2022 2:00 PM Medical Record Number: [id number] Patient Account Number: [id number] Date of Birth/Sex: Treating RN: 08/12/1966 (56 y.o. [name] Primary Care Shemiah Rosch: [name] Other Clinician: Referring Barney Gertsch: Treating Mitul Hallowell/Extender: [name] in Treatment: 2 Wound Status Wound Number: 1 Primary Pressure Ulcer Etiology: Wound Location: Sacrum Wound Open Wounding Event: Pressure Injury Status: Date Acquired: 07/29/2021 Comorbid Congestive Heart Failure, Hypertension, Type II Diabetes, Weeks Of Treatment: 2 History: Osteomyelitis, Confinement Anxiety Clustered Wound: No Photos Wound Measurements Length: (cm) 2.5 % Red Width: (cm) 1.5 % Red Depth: (cm) 1.4 Epith Area: (cm) 2.945 Tunn Volume: (cm) 4.123 Un[address] En Ma uction in Area: 50% uction in Volume: 36.4% elialization: Small (1-33%) eling: No rmining: Yes arting Position (o'clock): 12 ding Position (o'clock): 12 ximum Distance: (cm) 1.6 Wound Description Classification: Category/Stage IV Foul Wound Margin: Well defined,  not attached Slou Exudate Amount: Medium Exudate Type: Serous Exudate Color: amber Odor After Cleansing: No gh/Fibrino Yes Wound Bed Granulation Amount: Medium (34-66%) Exposed Structure Granulation Quality: Pink, Pale Fascia Exposed: No Necrotic Amount: Medium (34-66%) Fat Layer (Subcutaneous Tissue) Exposed: Yes Necrotic Quality: Adherent Slough Tendon Exposed: No Muscle Exposed: Yes Necrosis of Muscle: No Joint Exposed: No Bone Exposed: No Treatment Notes Wound #1 (Sacrum) Cleanser  Peri-Wound Care Topical Primary Dressing Promogran Prisma Matrix, 4.34 (sq in) (silver collagen) Discharge Instruction: lay in base of wound under the VAC sponge VAC Discharge Instruction: be sure to place sponge in undermining and drape under the bridge Secondary Dressing Secured With Compression Wrap Compression Stockings Add-Ons Electronic Signature(s) Signed: 01/29/2022 6:06:15 PM By: Baruch Gouty RN, BSN Entered By: Baruch Gouty on 01/28/2022 14:47:18 -------------------------------------------------------------------------------- [place] Details Patient Name: Date of Service: [name], [place] 01/28/2022 2:00 PM Medical Record Number: [id number] Patient Account Number: [id number] Date of Birth/Sex: Treating RN: Jan 02, 1966 (56 y.o. [name] Primary Care Loyal Holzheimer: [name] Other Clinician: Referring Aymen Widrig: Treating Christasia Angeletti/Extender: [name] in Treatment: 2 Vital Signs Time Taken: 14:35 Temperature (F): 97.6 Pulse (bpm): 66 Respiratory Rate (breaths/min): 18 Blood Pressure (mmHg): 140/79 Reference Range: 80 - 120 mg / dl Electronic Signature(s) Signed: 01/28/2022 3:12:02 PM By: Sandre Kitty Entered By: Sandre Kitty on 01/28/2022 14:35:56

## 2022-01-30 ENCOUNTER — Telehealth: Payer: Self-pay | Admitting: Family

## 2022-01-30 NOTE — Telephone Encounter (Signed)
..  Home Health Certification or Plan of Care Tracking  Is this a Certification or Plan of Care? yes  [place]: [place]  Order Number:  [id number]  Has charge sheet been attached? yes  Where has form been placed:  In provider's box  Faxed to:   [phone]

## 2022-02-04 ENCOUNTER — Encounter: Payer: Self-pay | Admitting: Family

## 2022-02-04 ENCOUNTER — Encounter (HOSPITAL_BASED_OUTPATIENT_CLINIC_OR_DEPARTMENT_OTHER): Payer: Medicare HMO | Admitting: General Surgery

## 2022-02-04 ENCOUNTER — Ambulatory Visit: Payer: Medicare HMO | Admitting: Nurse Practitioner

## 2022-02-04 DIAGNOSIS — L89154 Pressure ulcer of sacral region, stage 4: Secondary | ICD-10-CM | POA: Diagnosis not present

## 2022-02-04 DIAGNOSIS — Z89512 Acquired absence of left leg below knee: Secondary | ICD-10-CM | POA: Diagnosis not present

## 2022-02-04 DIAGNOSIS — Z89511 Acquired absence of right leg below knee: Secondary | ICD-10-CM | POA: Diagnosis not present

## 2022-02-09 DIAGNOSIS — G894 Chronic pain syndrome: Secondary | ICD-10-CM

## 2022-02-09 DIAGNOSIS — E1142 Type 2 diabetes mellitus with diabetic polyneuropathy: Secondary | ICD-10-CM | POA: Diagnosis not present

## 2022-02-09 DIAGNOSIS — E1165 Type 2 diabetes mellitus with hyperglycemia: Secondary | ICD-10-CM | POA: Diagnosis not present

## 2022-02-09 DIAGNOSIS — M9973 Connective tissue and disc stenosis of intervertebral foramina of lumbar region: Secondary | ICD-10-CM

## 2022-02-09 DIAGNOSIS — M5137 Other intervertebral disc degeneration, lumbosacral region: Secondary | ICD-10-CM

## 2022-02-09 DIAGNOSIS — I4892 Unspecified atrial flutter: Secondary | ICD-10-CM

## 2022-02-09 DIAGNOSIS — Z993 Dependence on wheelchair: Secondary | ICD-10-CM

## 2022-02-09 DIAGNOSIS — F5101 Primary insomnia: Secondary | ICD-10-CM

## 2022-02-09 DIAGNOSIS — Z89511 Acquired absence of right leg below knee: Secondary | ICD-10-CM

## 2022-02-09 DIAGNOSIS — Z7901 Long term (current) use of anticoagulants: Secondary | ICD-10-CM

## 2022-02-09 DIAGNOSIS — Z89612 Acquired absence of left leg above knee: Secondary | ICD-10-CM

## 2022-02-09 DIAGNOSIS — Z9181 History of falling: Secondary | ICD-10-CM

## 2022-02-09 DIAGNOSIS — Z8701 Personal history of pneumonia (recurrent): Secondary | ICD-10-CM

## 2022-02-09 DIAGNOSIS — Z794 Long term (current) use of insulin: Secondary | ICD-10-CM

## 2022-02-09 DIAGNOSIS — L89154 Pressure ulcer of sacral region, stage 4: Secondary | ICD-10-CM | POA: Diagnosis not present

## 2022-02-09 DIAGNOSIS — F419 Anxiety disorder, unspecified: Secondary | ICD-10-CM | POA: Diagnosis not present

## 2022-02-09 DIAGNOSIS — I130 Hypertensive heart and chronic kidney disease with heart failure and stage 1 through stage 4 chronic kidney disease, or unspecified chronic kidney disease: Secondary | ICD-10-CM | POA: Diagnosis not present

## 2022-02-09 DIAGNOSIS — Z87440 Personal history of urinary (tract) infections: Secondary | ICD-10-CM

## 2022-02-09 DIAGNOSIS — E785 Hyperlipidemia, unspecified: Secondary | ICD-10-CM

## 2022-02-09 DIAGNOSIS — M8580 Other specified disorders of bone density and structure, unspecified site: Secondary | ICD-10-CM

## 2022-02-09 DIAGNOSIS — M4644 Discitis, unspecified, thoracic region: Secondary | ICD-10-CM

## 2022-02-09 DIAGNOSIS — Z466 Encounter for fitting and adjustment of urinary device: Secondary | ICD-10-CM | POA: Diagnosis not present

## 2022-02-09 DIAGNOSIS — I700 Atherosclerosis of aorta: Secondary | ICD-10-CM

## 2022-02-09 DIAGNOSIS — F339 Major depressive disorder, recurrent, unspecified: Secondary | ICD-10-CM | POA: Diagnosis not present

## 2022-02-09 DIAGNOSIS — E1122 Type 2 diabetes mellitus with diabetic chronic kidney disease: Secondary | ICD-10-CM | POA: Diagnosis not present

## 2022-02-09 DIAGNOSIS — N1831 Chronic kidney disease, stage 3a: Secondary | ICD-10-CM

## 2022-02-09 DIAGNOSIS — M47817 Spondylosis without myelopathy or radiculopathy, lumbosacral region: Secondary | ICD-10-CM

## 2022-02-09 DIAGNOSIS — I2510 Atherosclerotic heart disease of native coronary artery without angina pectoris: Secondary | ICD-10-CM

## 2022-02-09 DIAGNOSIS — I5042 Chronic combined systolic (congestive) and diastolic (congestive) heart failure: Secondary | ICD-10-CM | POA: Diagnosis not present

## 2022-02-09 DIAGNOSIS — D631 Anemia in chronic kidney disease: Secondary | ICD-10-CM

## 2022-02-09 DIAGNOSIS — N320 Bladder-neck obstruction: Secondary | ICD-10-CM

## 2022-02-09 DIAGNOSIS — Z89512 Acquired absence of left leg below knee: Secondary | ICD-10-CM

## 2022-02-09 DIAGNOSIS — N139 Obstructive and reflux uropathy, unspecified: Secondary | ICD-10-CM

## 2022-02-09 NOTE — Progress Notes (Signed)
[name], [name] ([id number]) Visit Report for 02/04/2022 Chief Complaint Document Details Patient Name: Date of Service: [name], [name] 02/04/2022 3:30 PM Medical Record Number: [id number] Patient Account Number: [id number] Date of Birth/Sex: Treating RN: 1966/08/22 (56 y.o. [name] Primary Care Provider: Jeanie [name] Other Clinician: Referring Provider: Treating Provider/Extender: [name] in Treatment: 3 Information Obtained from: Patient Chief Complaint Patient is at the clinic for treatment of an open pressure ulcer Electronic Signature(s) Signed: 02/04/2022 5:00:04 PM By: [name] MD FACS Entered By: [name] on 02/04/2022 17:00:03 -------------------------------------------------------------------------------- HPI Details Patient Name: Date of Service: [name], [name] 02/04/2022 3:30 PM Medical Record Number: [id number] Patient Account Number: [id number] Date of Birth/Sex: Treating RN: 30-Jan-1966 (56 y.o. [name] Primary Care Provider: Jeanie [name] Other Clinician: Referring Provider: Treating Provider/Extender: [name] in Treatment: 3 History of Present Illness HPI Description: ADMISSION 01/08/2022 This is a 56 year old male with a past medical history notable for type 2 diabetes mellitus (last A1c was 8.6) congestive heart failure, hypertension, chronic pain, and bilateral below-knee amputations. His most recent amputation was in November 2022. While he was in the hospital, he developed a sacral pressure ulcer. He was subsequently in a skilled nursing facility for some time. He was discharged with home health and had been in a wound VAC, but was then admitted to the hospital last week when the wound appeared to be worsening. Apparently the periwound skin was macerated and the device that he had been using was leaking quite a bit. Evaluation while in the hospital  included a consultation with infectious disease who did not think he had any evidence of osteomyelitis, plastic surgery who felt that he was not an appropriate flap candidate (their note also states that he is not interested in a flap) and wound care who took him out of the wound VAC and initiated wet-to-dry dressing changes. He has a new wound VAC from [facility] on order, anticipated delivery today. The wound itself is fairly small and isolated to the sacrum. There is muscle exposed. No bone is appreciated but it is palpable beneath the surface. The muscle itself is bit pale and there is heaped up epibole around the wound edges. No significant odor or drainage. 01/14/2022: His wound VAC was not initiated until this past Friday. He has not had any issues with the VAC but today we found that the bridge foam was applied directly to the skin rather than over a layer of adhesive drape. His home health nurse also requested that we consider applying silver collagen to the wound bed in addition to the VAC. There is still a little bit of heaped up senescent skin around the wound periphery. No significant change to the wound dimensions. 01/21/2022: No significant change to the wound dimensions. The senescent skin has not reaccumulated. The periwound skin remains a bit macerated but without any obvious breakdown. The wound surface itself has a shiny appearance with a little bit of slough accumulation; no true robust granulation tissue at this time. 01/28/2022: The wound is about the same size but a little bit shallower. There is a little bit of senescent skin reaccumulation at the cranial aspect. The periwound skin is red but not macerated and without any tissue breakdown. The wound still does not have the most robust surface. There is a bit of slough accumulation. 02/04/2022: The wound is a bit smaller and the undermining has come in somewhat. There continues to be granulation tissue formation  within the wound bed.  No significant slough accumulation and his periwound skin is in better condition. Electronic Signature(s) Signed: 02/04/2022 5:00:41 PM By: [name] MD FACS Entered By: [name] on 02/04/2022 17:00:41 -------------------------------------------------------------------------------- Physical Exam Details Patient Name: Date of Service: [name], [name] 02/04/2022 3:30 PM Medical Record Number: [id number] Patient Account Number: [id number] Date of Birth/Sex: Treating RN: 20-Aug-1966 (56 y.o. [name] Primary Care Provider: Jeanie [name] Other Clinician: Referring Provider: Treating Provider/Extender: [name] in Treatment: 3 Constitutional . . . . No acute distress. Respiratory Normal work of breathing on room air. Notes 02/04/2022: The wound is a bit smaller and the undermining has come in somewhat. There continues to be granulation tissue formation within the wound bed. No significant slough accumulation and his periwound skin is in better condition. Electronic Signature(s) Signed: 02/04/2022 5:01:08 PM By: [name] MD FACS Entered By: [name] on 02/04/2022 17:01:08 -------------------------------------------------------------------------------- Physician Orders Details Patient Name: Date of Service: [name], [name] 02/04/2022 3:30 PM Medical Record Number: [id number] Patient Account Number: [id number] Date of Birth/Sex: Treating RN: June 04, 1966 (56 y.o. [name] Primary Care Provider: Jeanie [name] Other Clinician: Referring Provider: Treating Provider/Extender: [name] in Treatment: 3 Verbal / Phone Orders: No Diagnosis Coding Follow-up Appointments ppointment in 1 week. - Dr. Lucienne Capers with [facility] Return A pt request to be seen in 2 wks states [facility] week Wednesday 02/18/22 @ 12:30pm Bathing/ Shower/ Hygiene May shower with  protection but do not get wound dressing(s) wet. Negative Presssure Wound Therapy Wound #1 Sacrum Wound Vac to wound continuously at 153m/hg pressure Black Foam Off-Loading Gel mattress overlay (Group 1) Turn and reposition every 2 hours Home Health Dressing changes to be completed by [facility] Monday / Wednesday / Friday [facility] Be sure no sponge directly on the skin, place drape under the bridge Other Home Health Orders/Instructions: - [facility] Wound Treatment Wound #1 - Sacrum Prim Dressing: Promogran Prisma Matrix, 4.34 (sq in) (silver collagen) 3 x Per Week/30 Days ary Discharge Instructions: lay in base of wound under the VAC sponge Prim Dressing: VAC 3 x Per Week/30 Days ary Discharge Instructions: be sure to place sponge in undermining and drape under the bridge Electronic Signature(s) Signed: 02/05/2022 8:58:31 AM By: CFredirick MaudlinMD FACS Signed: 02/05/2022 5:59:26 PM By: PSharyn CreamerRN, BSN Previous Signature: 02/04/2022 5:02:34 PM Version By: CFredirick MaudlinMD FACS Entered By: PSharyn Creameron 02/04/2022 17:11:54 -------------------------------------------------------------------------------- Problem List Details Patient Name: Date of Service: [place]5/31/2023 3:30 PM Medical Record Number: [id number] Account Number: [id number] of Birth/Sex: Treating RN: [birth year](56y.o. MErnestene MentionPrimary Care Provider: HJeanie SewerOther Clinician: Referring Provider: Treating Provider/Extender: CChristie Nottinghamin Treatment: 3 Active Problems ICD-10 Encounter Code Description Active Date MDM Diagnosis L89.154 Pressure ulcer of sacral region, stage 4 01/08/2022 No Yes Z89.512 Acquired absence of left leg below knee 01/08/2022 No Yes Z89.511 Acquired absence of right leg below knee 01/08/2022 No Yes Inactive Problems Resolved Problems Electronic  Signature(s) Signed: 02/04/2022 4:59:53 PM By: CFredirick MaudlinMD FACS Entered By: CFredirick Maudlinon 02/04/2022 16:59:53 -------------------------------------------------------------------------------- Progress Note Details Patient Name: Date of Service: [place]5/31/2023 3:30 PM Medical Record Number: [id number] Account Number: [id number] of Birth/Sex: Treating RN: [birth year](56y.o. MErnestene MentionPrimary Care Provider: HJeanie SewerOther  Clinician: Referring Provider: Treating Provider/Extender: [name] in Treatment: 3 Subjective Chief Complaint Information obtained from Patient Patient is at the clinic for treatment of an open pressure ulcer History of Present Illness (HPI) ADMISSION 01/08/2022 This is a 56 year old male with a past medical history notable for type 2 diabetes mellitus (last A1c was 8.6) congestive heart failure, hypertension, chronic pain, and bilateral below-knee amputations. His most recent amputation was in November 2022. While he was in the hospital, he developed a sacral pressure ulcer. He was subsequently in a skilled nursing facility for some time. He was discharged with home health and had been in a wound VAC, but was then admitted to the hospital last week when the wound appeared to be worsening. Apparently the periwound skin was macerated and the device that he had been using was leaking quite a bit. Evaluation while in the hospital included a consultation with infectious disease who did not think he had any evidence of osteomyelitis, plastic surgery who felt that he was not an appropriate flap candidate (their note also states that he is not interested in a flap) and wound care who took him out of the wound VAC and initiated wet-to-dry dressing changes. He has a new wound VAC from [facility] on order, anticipated delivery today. The wound itself is fairly small and isolated to the sacrum. There  is muscle exposed. No bone is appreciated but it is palpable beneath the surface. The muscle itself is bit pale and there is heaped up epibole around the wound edges. No significant odor or drainage. 01/14/2022: His wound VAC was not initiated until this past Friday. He has not had any issues with the VAC but today we found that the bridge foam was applied directly to the skin rather than over a layer of adhesive drape. His home health nurse also requested that we consider applying silver collagen to the wound bed in addition to the VAC. There is still a little bit of heaped up senescent skin around the wound periphery. No significant change to the wound dimensions. 01/21/2022: No significant change to the wound dimensions. The senescent skin has not reaccumulated. The periwound skin remains a bit macerated but without any obvious breakdown. The wound surface itself has a shiny appearance with a little bit of slough accumulation; no true robust granulation tissue at this time. 01/28/2022: The wound is about the same size but a little bit shallower. There is a little bit of senescent skin reaccumulation at the cranial aspect. The periwound skin is red but not macerated and without any tissue breakdown. The wound still does not have the most robust surface. There is a bit of slough accumulation. 02/04/2022: The wound is a bit smaller and the undermining has come in somewhat. There continues to be granulation tissue formation within the wound bed. No significant slough accumulation and his periwound skin is in better condition. Patient History Information obtained from Patient, Caregiver, Chart. Family History Cancer - Father, Heart Disease - Father,Paternal Grandparents, Hypertension - Father, No family history of Diabetes, Hereditary Spherocytosis, Kidney Disease, Lung Disease, Seizures, Stroke, Thyroid Problems, Tuberculosis. Social History Never smoker, Marital Status - Divorced, Alcohol Use - Never,  Drug Use - No History, Caffeine Use - Moderate - coffee. Medical History Eyes Denies history of Cataracts, Glaucoma, Optic Neuritis Ear/Nose/Mouth/Throat Denies history of Chronic sinus problems/congestion, Middle ear problems Cardiovascular Patient has history of Congestive Heart Failure, Hypertension Endocrine Patient has history of Type II Diabetes Denies history of Type I  Diabetes Genitourinary Denies history of End Stage Renal Disease Integumentary (Skin) Denies history of History of Burn Musculoskeletal Patient has history of Osteomyelitis - S1 and coccyx Oncologic Denies history of Received Chemotherapy, Received Radiation Psychiatric Patient has history of Confinement Anxiety Denies history of Anorexia/bulimia Hospitalization/Surgery History - bil BKA. Medical A Surgical History Notes nd Respiratory pulmonary nodules Genitourinary renal mass, urinary hesitancy Musculoskeletal discitis of thoracic region Objective Constitutional No acute distress. Vitals Time Taken: 4:05 PM, Temperature: 98.2 F, Pulse: 65 bpm, Respiratory Rate: 18 breaths/min, Blood Pressure: 140/65 mmHg. Respiratory Normal work of breathing on room air. General Notes: 02/04/2022: The wound is a bit smaller and the undermining has come in somewhat. There continues to be granulation tissue formation within the wound bed. No significant slough accumulation and his periwound skin is in better condition. Integumentary (Hair, Skin) Wound #1 status is Open. Original cause of wound was Pressure Injury. The date acquired was: 07/29/2021. The wound has been in treatment 3 weeks. The wound is located on the Sacrum. The wound measures 2.5cm length x 1.5cm width x 1.5cm depth; 2.945cm^2 area and 4.418cm^3 volume. There is Fat Layer (Subcutaneous Tissue) exposed. There is no tunneling or undermining noted. There is a medium amount of serous drainage noted. The wound margin is well defined and not attached to the  wound base. There is medium (34-66%) pink, pale granulation within the wound bed. There is a medium (34-66%) amount of necrotic tissue within the wound bed including Adherent Slough. 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 - Dr. Lucienne Capers with [facility]. 5/31 @ 3:30 pm Bathing/ Shower/ Hygiene: May shower with protection but do not get wound dressing(s) wet. Negative Presssure Wound Therapy: Wound #1 Sacrum: Wound Vac to wound continuously at 14m/hg pressure Black Foam Off-Loading: Gel mattress overlay (Group 1) Turn and reposition every 2 hours Home Health: Dressing changes to be completed by [facility] [place] Monday / Wednesday / Friday [facility] Be sure no sponge directly on the skin, place drape under the bridge Other Home Health Orders/Instructions: - [facility] WOUND #1: - Sacrum Wound Laterality: Prim Dressing: Promogran Prisma Matrix, 4.34 (sq in) (silver collagen) 3 x Per Week/30 Days ary Discharge Instructions: lay in base of wound under the VAC sponge Prim Dressing: VAC 3 x Per Week/30 Days ary Discharge Instructions: be sure to place sponge in undermining and drape under the bridge 02/04/2022: The wound is a bit smaller and the undermining has come in somewhat. There continues to be granulation tissue formation within the wound bed. No significant slough accumulation and his periwound skin is in better condition. No debridement was necessary today. We will continue using the wound VAC. We will window it so that there is less contact of the drape with the periwound skin, per the patient's request. He will follow-up in 1 week. Electronic Signature(s) Signed: 02/04/2022 5:01:54 PM By: CFredirick MaudlinMD FACS Entered By: CFredirick Maudlinon  02/04/2022 17:01:54 -------------------------------------------------------------------------------- HxROS Details Patient Name: Date of Service: [place] R[place]5/31/2023 3:30 PM Medical Record Number: [id number] Account Number: [id number] of Birth/Sex: Treating RN: 510/30/1967(56y.o. MErnestene MentionPrimary Care Provider: HJeanie SewerOther Clinician: Referring Provider: Treating Provider/Extender: CChristie Nottinghamin Treatment: 3 Information Obtained From Patient Caregiver Chart Eyes  Medical History: Negative for: Cataracts; Glaucoma; Optic Neuritis Ear/Nose/Mouth/Throat Medical History: Negative for: Chronic sinus problems/congestion; Middle ear problems Respiratory Medical History: Past Medical History Notes: pulmonary nodules Cardiovascular Medical History: Positive for: Congestive Heart Failure; Hypertension Endocrine Medical History: Positive for: Type II Diabetes Negative for: Type I Diabetes Time with diabetes: since 2004 Treated with: Insulin Blood sugar tested every day: Yes Tested : 4 times per day Genitourinary Medical History: Negative for: End Stage Renal Disease Past Medical History Notes: renal mass, urinary hesitancy Integumentary (Skin) Medical History: Negative for: History of Burn Musculoskeletal Medical History: Positive for: Osteomyelitis - S1 and coccyx Past Medical History Notes: discitis of thoracic region Oncologic Medical History: Negative for: Received Chemotherapy; Received Radiation Psychiatric Medical History: Positive for: Confinement Anxiety Negative for: Anorexia/bulimia Immunizations Pneumococcal Vaccine: Received Pneumococcal Vaccination: No Implantable Devices No devices added Hospitalization / Surgery History Type of Hospitalization/Surgery bil BKA Family and Social History Cancer: Yes - Father; Diabetes: No; Heart Disease: Yes - Father,Paternal Grandparents;  Hereditary Spherocytosis: No; Hypertension: Yes - Father; Kidney Disease: No; Lung Disease: No; Seizures: No; Stroke: No; Thyroid Problems: No; Tuberculosis: No; Never smoker; Marital Status - Divorced; Alcohol Use: Never; Drug Use: No History; Caffeine Use: Moderate - coffee; Financial Concerns: No; Food, Clothing or Shelter Needs: No; Support System Lacking: No; Transportation Concerns: Yes - hurts to transfer Electronic Signature(s) Signed: 02/04/2022 5:02:34 PM By: [name] MD FACS Signed: 02/09/2022 5:13:49 PM By: Baruch Gouty RN, BSN Entered By: [name] on 02/04/2022 17:00:47 -------------------------------------------------------------------------------- [place] Details Patient Name: Date of Service: [name], [place] 02/04/2022 Medical Record Number: [id number] Patient Account Number: [id number] Date of Birth/Sex: Treating RN: [birth year] (56 y.o. [name] Primary Care Provider: Jeanie [name] Other Clinician: Referring Provider: Treating Provider/Extender: [name] in Treatment: 3 Diagnosis Coding ICD-10 Codes Code Description [phone] Pressure ulcer of sacral region, stage 4 Z89.512 Acquired absence of left leg below knee Z89.511 Acquired absence of right leg below knee Facility Procedures CPT4 Code: [id number] Description: 73220 - WOUND VAC-50 SQ CM OR LESS Modifier: Quantity: 1 Physician Procedures : CPT4 Code Description Modifier [id number] - WC PHYS LEVEL 3 - EST PT ICD-10 Diagnosis Description L89.154 Pressure ulcer of sacral region, stage 4 Z89.512 Acquired absence of left leg below knee Z89.511 Acquired absence of right leg below knee Quantity: 1 Electronic Signature(s) Signed: 02/04/2022 5:02:09 PM By: [name] MD FACS Entered By: [name] on 02/04/2022 17:02:09

## 2022-02-10 ENCOUNTER — Telehealth: Payer: Self-pay | Admitting: Family

## 2022-02-10 NOTE — Telephone Encounter (Signed)
Doctor would like to speak with you directly in regards to [employer].

## 2022-02-10 NOTE — Telephone Encounter (Signed)
[employer] representative called back to allow PCP and doctor previously discussed about on 02/09/22 could communicate. The doctor in question was able to get on the line and stated she was unable to hold any longer due to having a meeting. She has left her cell number, [phone] and requested to be called by 3pm today.

## 2022-02-16 NOTE — Telephone Encounter (Signed)
Left a vm at number above requesting a return call today, will be out of office until next Tuesday.

## 2022-02-18 ENCOUNTER — Encounter (HOSPITAL_BASED_OUTPATIENT_CLINIC_OR_DEPARTMENT_OTHER): Payer: Medicare HMO | Attending: General Surgery | Admitting: General Surgery

## 2022-02-18 DIAGNOSIS — E119 Type 2 diabetes mellitus without complications: Secondary | ICD-10-CM | POA: Insufficient documentation

## 2022-02-18 DIAGNOSIS — Z89511 Acquired absence of right leg below knee: Secondary | ICD-10-CM | POA: Diagnosis not present

## 2022-02-18 DIAGNOSIS — L89154 Pressure ulcer of sacral region, stage 4: Secondary | ICD-10-CM | POA: Diagnosis not present

## 2022-02-18 DIAGNOSIS — I110 Hypertensive heart disease with heart failure: Secondary | ICD-10-CM | POA: Diagnosis not present

## 2022-02-18 DIAGNOSIS — Z89512 Acquired absence of left leg below knee: Secondary | ICD-10-CM | POA: Insufficient documentation

## 2022-02-18 DIAGNOSIS — I509 Heart failure, unspecified: Secondary | ICD-10-CM | POA: Diagnosis not present

## 2022-02-18 DIAGNOSIS — L98492 Non-pressure chronic ulcer of skin of other sites with fat layer exposed: Secondary | ICD-10-CM | POA: Diagnosis not present

## 2022-02-18 DIAGNOSIS — G8929 Other chronic pain: Secondary | ICD-10-CM | POA: Insufficient documentation

## 2022-02-18 NOTE — Progress Notes (Signed)
[name], [name] ([id number]) Visit Report for 02/18/2022 Chief Complaint Document Details Patient Name: Date of Service: [place], [name] 02/18/2022 12:30 PM Medical Record Number: [id number] Patient Account Number: [id number] Date of Birth/Sex: Treating RN: [birth year] (56 y.o. [name] Primary Care Provider: [name] Other Clinician: Referring Provider: Treating Provider/Extender: [name] in Treatment: 5 Information Obtained from: Patient Chief Complaint Patient is at the clinic for treatment of an open pressure ulcer Electronic Signature(s) Signed: 02/18/2022 1:28:02 PM By: [name] MD FACS Entered By: [name] on 02/18/2022 13:28:02 -------------------------------------------------------------------------------- Debridement Details Patient Name: Date of Service: [name], [name] 02/18/2022 12:30 PM Medical Record Number: [id number] Patient Account Number: [id number] Date of Birth/Sex: Treating RN: 1965-12-15 (56 y.o. Ulyses Amor, [facility] Basta Primary Care Provider: [name] Other Clinician: Referring Provider: Treating Provider/Extender: [name] in Treatment: 5 Debridement Performed for Assessment: Wound #1 Sacrum Performed By: Physician [name], MD Debridement Type: Debridement Level of Consciousness (Pre-procedure): Awake and Alert Pre-procedure Verification/Time Out Yes - 13:20 Taken: Start Time: 13:20 Pain Control: Other : Benzocaine 20% spray T Area Debrided (L x W): otal 2.7 (cm) x 2.8 (cm) = 7.56 (cm) Tissue and other material debrided: Viable, Non-Viable, Slough, Subcutaneous, Slough Level: Skin/Subcutaneous Tissue Debridement Description: Excisional Instrument: Curette Bleeding: Minimum Hemostasis Achieved: Silver Nitrate Procedural Pain: 3 Post Procedural Pain: 3 Response to Treatment: Procedure was tolerated well Level of Consciousness (Post-  Awake and Alert procedure): Post Debridement Measurements of Total Wound Length: (cm) 2.7 Stage: Category/Stage IV Width: (cm) 2.8 Depth: (cm) 1.5 Volume: (cm) 8.906 Character of Wound/Ulcer Post Debridement: Requires Further Debridement Post Procedure Diagnosis Same as Pre-procedure Electronic Signature(s) Signed: 02/18/2022 3:56:32 PM By: [name] MD FACS Signed: 02/18/2022 5:43:09 PM By: Baruch Gouty RN, BSN Entered By: Baruch Gouty on 02/18/2022 13:25:34 -------------------------------------------------------------------------------- HPI Details Patient Name: Date of Service: [name], [name] 02/18/2022 12:30 PM Medical Record Number: [id number] Patient Account Number: [id number] Date of Birth/Sex: Treating RN: 09-Feb-1966 (56 y.o. [name] Primary Care Provider: [name] Other Clinician: Referring Provider: Treating Provider/Extender: [name] in Treatment: 5 History of Present Illness HPI Description: ADMISSION 01/08/2022 This is a 56 year old male with a past medical history notable for type 2 diabetes mellitus (last A1c was 8.6) congestive heart failure, hypertension, chronic pain, and bilateral below-knee amputations. His most recent amputation was in November 2022. While he was in the hospital, he developed a sacral pressure ulcer. He was subsequently in a skilled nursing facility for some time. He was discharged with home health and had been in a wound VAC, but was then admitted to the hospital last week when the wound appeared to be worsening. Apparently the periwound skin was macerated and the device that he had been using was leaking quite a bit. Evaluation while in the hospital included a consultation with infectious disease who did not think he had any evidence of osteomyelitis, plastic surgery who felt that he was not an appropriate flap candidate (their note also states that he is not interested in a  flap) and wound care who took him out of the wound VAC and initiated wet-to-dry dressing changes. He has a new wound VAC from [facility] on order, anticipated delivery today. The wound itself is fairly small and isolated to the sacrum. There is muscle exposed. No bone is appreciated but it is palpable beneath the surface. The muscle itself is bit pale and there is heaped up epibole around the wound  edges. No significant odor or drainage. 01/14/2022: His wound VAC was not initiated until this past Friday. He has not had any issues with the VAC but today we found that the bridge foam was applied directly to the skin rather than over a layer of adhesive drape. His home health nurse also requested that we consider applying silver collagen to the wound bed in addition to the VAC. There is still a little bit of heaped up senescent skin around the wound periphery. No significant change to the wound dimensions. 01/21/2022: No significant change to the wound dimensions. The senescent skin has not reaccumulated. The periwound skin remains a bit macerated but without any obvious breakdown. The wound surface itself has a shiny appearance with a little bit of slough accumulation; no true robust granulation tissue at this time. 01/28/2022: The wound is about the same size but a little bit shallower. There is a little bit of senescent skin reaccumulation at the cranial aspect. The periwound skin is red but not macerated and without any tissue breakdown. The wound still does not have the most robust surface. There is a bit of slough accumulation. 02/04/2022: The wound is a bit smaller and the undermining has come in somewhat. There continues to be granulation tissue formation within the wound bed. No significant slough accumulation and his periwound skin is in better condition. 02/18/2022: The wound stinks today. There is no obvious pus but the drainage and wound itself are malodorous. He continues to have heaped up tissue  within the wound bed that is rather grayish and not particularly robust-appearing. He is very angry today with his situation. Electronic Signature(s) Signed: 02/18/2022 1:29:10 PM By: [name] MD FACS Entered By: [name] on 02/18/2022 13:29:09 -------------------------------------------------------------------------------- Physical Exam Details Patient Name: Date of Service: [name], [name] 02/18/2022 12:30 PM Medical Record Number: [id number] Patient Account Number: [id number] Date of Birth/Sex: Treating RN: 1966/05/20 (56 y.o. [name] Primary Care Provider: [name] Other Clinician: Referring Provider: Treating Provider/Extender: [name] in Treatment: 5 Constitutional Slightly hypertensive. . . . No acute distress.Marland Kitchen Respiratory Normal work of breathing on room air.. Notes 02/18/2022: The wound stinks today. There is no obvious pus or necrosis, but the drainage and wound itself are malodorous. He continues to have heaped up tissue within the wound bed that is rather grayish and not particularly robust-appearing. Electronic Signature(s) Signed: 02/18/2022 1:32:36 PM By: [name] MD FACS Entered By: [name] on 02/18/2022 13:32:35 -------------------------------------------------------------------------------- Physician Orders Details Patient Name: Date of Service: [name], [name] 02/18/2022 12:30 PM Medical Record Number: [id number] Patient Account Number: [id number] Date of Birth/Sex: Treating RN: Oct 12, 1965 (56 y.o. [name] Primary Care Provider: [name] Other Clinician: Referring Provider: Treating Provider/Extender: [name] in Treatment: 5 Verbal / Phone Orders: No Diagnosis Coding ICD-10 Coding Code Description L89.154 Pressure ulcer of sacral region, stage 4 Z89.512 Acquired absence of left leg below knee Z89.511 Acquired  absence of right leg below knee Follow-up Appointments ppointment in 1 week. - Dr. Lucienne Capers with [facility] Return A pt request to be seen in 2 wks states [facility] come [facility] week Wednesday 02/18/22 @ 12:30pm Bathing/ Shower/ Hygiene May shower with protection but do not get wound dressing(s) wet. Negative Presssure Wound Therapy Wound #1 Sacrum Wound Vac to wound continuously at 123m/hg pressure Black Foam Off-Loading Gel mattress overlay (Group 1) Turn and reposition every 2 hours Home Health Dressing changes to be completed by  Home Health on Monday / Wednesday / Friday except when patient has scheduled visit at [facility]. - Be sure no sponge directly on the skin, place drape under the bridge Other Home Health Orders/Instructions: - [facility] Wound Treatment Wound #1 - Sacrum Topical: Gentamicin 3 x Per Week/30 Days Discharge Instructions: apply thin layer to wound bed with dressing changes Prim Dressing: VAC 3 x Per Week/30 Days ary Discharge Instructions: be sure to place sponge in undermining and drape under the bridge Laboratory naerobe culture (MICRO) - sacrum Bacteria identified in Unspecified specimen by A LOINC Code: 099-8 Convenience Name: Anaerobic culture Patient Medications llergies: Bactrim, Ceprotin ([place]), ciprofloxacin, Levaquin A Notifications Medication Indication Start End 02/18/2022 cefdinir DOSE oral 300 mg capsule - 1 capsule PO BID x 10 days 02/18/2022 gentamicin DOSE topical 0.1 % ointment - apply to wound bed with vac dressing changes Electronic Signature(s) Signed: 02/18/2022 1:35:37 PM By: [name] MD FACS Entered By: [name] on 02/18/2022 13:35:36 -------------------------------------------------------------------------------- Problem List Details Patient Name: Date of Service: [name], RO [place] 02/18/2022 12:30 PM Medical Record Number: [id number] Patient Account Number: [id number] Date of Birth/Sex: Treating  RN: Oct 12, 1965 (56 y.o. [name] Primary Care Provider: [name] Other Clinician: Referring Provider: Treating Provider/Extender: [name] in Treatment: 5 Active Problems ICD-10 Encounter Code Description Active Date MDM Diagnosis L89.154 Pressure ulcer of sacral region, stage 4 01/08/2022 No Yes Z89.512 Acquired absence of left leg below knee 01/08/2022 No Yes Z89.511 Acquired absence of right leg below knee 01/08/2022 No Yes Inactive Problems Resolved Problems Electronic Signature(s) Signed: 02/18/2022 1:27:40 PM By: [name] MD FACS Entered By: [name] on 02/18/2022 13:27:40 -------------------------------------------------------------------------------- Progress Note Details Patient Name: Date of Service: [name], [name] 02/18/2022 12:30 PM Medical Record Number: [id number] Patient Account Number: [id number] Date of Birth/Sex: Treating RN: 1965/12/11 (56 y.o. [name] Primary Care Provider: [name] Other Clinician: Referring Provider: Treating Provider/Extender: [name] in Treatment: 5 Subjective Chief Complaint Information obtained from Patient Patient is at the clinic for treatment of an open pressure ulcer History of Present Illness (HPI) ADMISSION 01/08/2022 This is a 56 year old male with a past medical history notable for type 2 diabetes mellitus (last A1c was 8.6) congestive heart failure, hypertension, chronic pain, and bilateral below-knee amputations. His most recent amputation was in November 2022. While he was in the hospital, he developed a sacral pressure ulcer. He was subsequently in a skilled nursing facility for some time. He was discharged with home health and had been in a wound VAC, but was then admitted to the hospital last week when the wound appeared to be worsening. Apparently the periwound skin was macerated and the device that he  had been using was leaking quite a bit. Evaluation while in the hospital included a consultation with infectious disease who did not think he had any evidence of osteomyelitis, plastic surgery who felt that he was not an appropriate flap candidate (their note also states that he is not interested in a flap) and wound care who took him out of the wound VAC and initiated wet-to-dry dressing changes. He has a new wound VAC from [facility] on order, anticipated delivery today. The wound itself is fairly small and isolated to the sacrum. There is muscle exposed. No bone is appreciated but it is palpable beneath the surface. The muscle itself is bit pale and there is heaped up epibole around the wound edges. No significant odor or drainage.  01/14/2022: His wound VAC was not initiated until this past Friday. He has not had any issues with the VAC but today we found that the bridge foam was applied directly to the skin rather than over a layer of adhesive drape. His home health nurse also requested that we consider applying silver collagen to the wound bed in addition to the VAC. There is still a little bit of heaped up senescent skin around the wound periphery. No significant change to the wound dimensions. 01/21/2022: No significant change to the wound dimensions. The senescent skin has not reaccumulated. The periwound skin remains a bit macerated but without any obvious breakdown. The wound surface itself has a shiny appearance with a little bit of slough accumulation; no true robust granulation tissue at this time. 01/28/2022: The wound is about the same size but a little bit shallower. There is a little bit of senescent skin reaccumulation at the cranial aspect. The periwound skin is red but not macerated and without any tissue breakdown. The wound still does not have the most robust surface. There is a bit of slough accumulation. 02/04/2022: The wound is a bit smaller and the undermining has come in somewhat. There  continues to be granulation tissue formation within the wound bed. No significant slough accumulation and his periwound skin is in better condition. 02/18/2022: The wound stinks today. There is no obvious pus but the drainage and wound itself are malodorous. He continues to have heaped up tissue within the wound bed that is rather grayish and not particularly robust-appearing. He is very angry today with his situation. Patient History Information obtained from Patient, Caregiver, Chart. Family History Cancer - Father, Heart Disease - Father,Paternal Grandparents, Hypertension - Father, No family history of Diabetes, Hereditary Spherocytosis, Kidney Disease, Lung Disease, Seizures, Stroke, Thyroid Problems, Tuberculosis. Social History Never smoker, Marital Status - Divorced, Alcohol Use - Never, Drug Use - No History, Caffeine Use - Moderate - coffee. Medical History Eyes Denies history of Cataracts, Glaucoma, Optic Neuritis Ear/Nose/Mouth/Throat Denies history of Chronic sinus problems/congestion, Middle ear problems Cardiovascular Patient has history of Congestive Heart Failure, Hypertension Endocrine Patient has history of Type II Diabetes Denies history of Type I Diabetes Genitourinary Denies history of End Stage Renal Disease Integumentary (Skin) Denies history of History of Burn Musculoskeletal Patient has history of Osteomyelitis - S1 and coccyx Oncologic Denies history of Received Chemotherapy, Received Radiation Psychiatric Patient has history of Confinement Anxiety Denies history of Anorexia/bulimia Hospitalization/Surgery History - bil BKA. Medical A Surgical History Notes nd Respiratory pulmonary nodules Genitourinary renal mass, urinary hesitancy Musculoskeletal discitis of thoracic region Objective Constitutional Slightly hypertensive. No acute distress.. Vitals Time Taken: 12:57 PM, Temperature: 97.8 F, Pulse: 67 bpm, Respiratory Rate: 18 breaths/min, Blood  Pressure: 141/87 mmHg, Capillary Blood Glucose: 175 mg/dl. General Notes: glucose per pt report yesterday Respiratory Normal work of breathing on room air.. General Notes: 02/18/2022: The wound stinks today. There is no obvious pus or necrosis, but the drainage and wound itself are malodorous. He continues to have heaped up tissue within the wound bed that is rather grayish and not particularly robust-appearing. Integumentary (Hair, Skin) Wound #1 status is Open. Original cause of wound was Pressure Injury. The date acquired was: 07/29/2021. The wound has been in treatment 5 weeks. The wound is located on the Sacrum. The wound measures 2.7cm length x 2.8cm width x 1.5cm depth; 5.938cm^2 area and 8.906cm^3 volume. There is Fat Layer (Subcutaneous Tissue) exposed. There is no tunneling noted, however, there is undermining  starting at 12:00 and ending at 6:00 with a maximum distance of 1.3cm. There is a medium amount of serous drainage noted. Foul odor after cleansing was noted. The wound margin is well defined and not attached to the wound base. There is small (1-33%) pink, pale, hyper - granulation within the wound bed. There is a large (67-100%) amount of necrotic tissue within the wound bed including Adherent Slough. 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 There was a Excisional Skin/Subcutaneous Tissue Debridement with a total area of 7.56 sq cm performed by [name], MD. With the following instrument(s): Curette to remove Viable and Non-Viable tissue/material. Material removed includes Subcutaneous Tissue and Slough and after achieving pain control using Other (Benzocaine 20% spray). No specimens were taken. A time out was conducted at 13:20, prior to the start of the  procedure. A Minimum amount of bleeding was controlled with Silver Nitrate. The procedure was tolerated well with a pain level of 3 throughout and a pain level of 3 following the procedure. Post Debridement Measurements: 2.7cm length x 2.8cm width x 1.5cm depth; 8.906cm^3 volume. Post debridement Stage noted as Category/Stage IV. Character of Wound/Ulcer Post Debridement requires further debridement. Post procedure Diagnosis Wound #1: Same as Pre-Procedure Plan Follow-up Appointments: Return Appointment in 1 week. - Dr. Lucienne Capers with [facility] pt request to be seen in 2 wks [facility] Wednesday 02/18/22 @ 12:30pm Bathing/ Shower/ Hygiene: May shower with protection but do not get wound dressing(s) wet. Negative Presssure Wound Therapy: Wound #1 Sacrum: Wound Vac to wound continuously at 172m/hg pressure Black Foam Off-Loading: Gel mattress overlay (Group 1) Turn and reposition every 2 hours Home Health: [place] [facility] the skin, place drape under the bridge Other [facility] Health [facility]: Anaerobic culture - sacrum The following medication(s) was prescribed: cefdinir oral 300 mg capsule 1 capsule PO BID x 10 days starting 02/18/2022 gentamicin topical 0.1 % ointment apply to wound bed with vac dressing changes starting 02/18/2022 WOUND #1: - Sacrum Wound Laterality: Topical: Gentamicin 3 x Per Week/30 Days Discharge Instructions: apply thin layer to wound bed with dressing changes Prim Dressing: VAC 3 x Per Week/30 Days ary Discharge Instructions: be sure to place sponge in undermining and drape under the bridge 02/18/2022: The wound stinks today. There is no obvious pus or necrosis, but the drainage and wound itself are malodorous. He continues to have heaped  up tissue within the wound bed that is rather grayish and not particularly robust-appearing. I used a curette to aggressively debride the entire wound surface. I then took a culture. I used silver nitrate to chemically cauterize the buds of granulation tissue to see if we can get these to recede somewhat. I am going to empirically prescribe cefdinir and will adjust the medication based on culture data. He may benefit from topical [place] antibiotic. We are also going to begin applying topical gentamicin to the wound surface before placing the sponge for his VAC. Follow-up in 1 week. Electronic Signature(s) Signed: 02/18/2022 1:37:08 PM By: CFredirick MaudlinMD FACS Entered By: CFredirick Maudlinon 02/18/2022 13:37:07 --------------------------------------------------------------------------------  HxROS Details Patient Name: Date of ServiceCyndie Mull [place], [name] 02/18/2022 12:30 PM Medical Record Number: [id number] Patient Account Number: [id number] Date of Birth/Sex: Treating RN: 18-Apr-1966 (56 y.o. [name] Primary Care Provider: [name] Other Clinician: Referring Provider: Treating Provider/Extender: [name] in Treatment: 5 Information Obtained From Patient Caregiver Chart Eyes Medical History: Negative for: Cataracts; Glaucoma; Optic Neuritis Ear/Nose/Mouth/Throat Medical History: Negative for: Chronic sinus problems/congestion; Middle ear problems Respiratory Medical History: Past Medical History Notes: pulmonary nodules Cardiovascular Medical History: Positive for: Congestive Heart Failure; Hypertension Endocrine Medical History: Positive for: Type II Diabetes Negative for: Type I Diabetes Time with diabetes: since 2004 Treated with: Insulin Blood sugar tested every day: Yes Tested : 4 times per day Genitourinary Medical History: Negative for: End Stage Renal Disease Past Medical History Notes: renal mass, urinary  hesitancy Integumentary (Skin) Medical History: Negative for: History of Burn Musculoskeletal Medical History: Positive for: Osteomyelitis - S1 and coccyx Past Medical History Notes: discitis of thoracic region Oncologic Medical History: Negative for: Received Chemotherapy; Received Radiation Psychiatric Medical History: Positive for: Confinement Anxiety Negative for: Anorexia/bulimia Immunizations Pneumococcal Vaccine: Received Pneumococcal Vaccination: No Implantable Devices No devices added Hospitalization / Surgery History Type of Hospitalization/Surgery bil BKA Family and Social History Cancer: Yes - Father; Diabetes: No; Heart Disease: Yes - Father,Paternal Grandparents; Hereditary Spherocytosis: No; Hypertension: Yes - Father; Kidney Disease: No; Lung Disease: No; Seizures: No; Stroke: No; Thyroid Problems: No; Tuberculosis: No; Never smoker; Marital Status - Divorced; Alcohol Use: Never; Drug Use: No History; Caffeine Use: Moderate - coffee; Financial Concerns: No; Food, Clothing or Shelter Needs: No; Support System Lacking: No; Transportation Concerns: Yes - hurts to transfer Electronic Signature(s) Signed: 02/18/2022 3:56:32 PM By: [name] MD FACS Signed: 02/18/2022 5:43:09 PM By: Baruch Gouty RN, BSN Entered By: [name] on 02/18/2022 13:31:01 -------------------------------------------------------------------------------- SuperBill Details Patient Name: Date of Service: [name], [name] 02/18/2022 Medical Record Number: [id number] Patient Account Number: [id number] Date of Birth/Sex: Treating RN: 1966/08/10 (56 y.o. [name] Primary Care Provider: [name] Other Clinician: Referring Provider: Treating Provider/Extender: [name] in Treatment: 5 Diagnosis Coding ICD-10 Codes Code Description [phone] Pressure ulcer of sacral region, stage 4 Z89.512 Acquired absence of left leg below  knee Z89.511 Acquired absence of right leg below knee Facility Procedures CPT4 Code: 25427062 Description: 37628 - DEB SUBQ TISSUE 20 SQ CM/< ICD-10 Diagnosis Description L89.154 Pressure ulcer of sacral region, stage 4 Modifier: Quantity: 1 CPT4 Code: 31517616 Description: 07371 - WOUND VAC-50 SQ CM OR LESS Modifier: Quantity: 1 Physician Procedures : CPT4 Code Description Modifier [id number] 99214 - WC PHYS LEVEL 4 - EST PT 25 ICD-10 Diagnosis Description L89.154 Pressure ulcer of sacral region, stage 4 Z89.512 Acquired absence of left leg below knee Z89.511 Acquired absence of right leg below knee Quantity: 1 : [id number] 50093 - WC PHYS SUBQ TISS 20 SQ CM ICD-10 Diagnosis Description L89.154 Pressure ulcer of sacral region, stage 4 Quantity: 1 Electronic Signature(s) Signed: 02/18/2022 1:37:25 PM By: [name] MD FACS Entered By: [name] on 02/18/2022 13:37:25

## 2022-02-18 NOTE — Progress Notes (Signed)
KEATIN, BENHAM ([id number]) Visit Report for 02/18/2022 Arrival Information Details Patient Name: Date of Service: [name], [name] 02/18/2022 12:30 PM Medical Record Number: [id number] Patient Account Number: [id number] Date of Birth/Sex: Treating RN: September 17, 1965 (56 y.o. [name] Primary Care Earla Charlie: [name] Other Clinician: Referring [name]: Treating Jhoselin Crume/Extender: [name] in Treatment: 5 Visit Information History Since Last Visit Added or deleted any medications: No Patient Arrived: Wheel Chair Any new allergies or adverse reactions: No Arrival Time: 12:54 Had a fall or experienced change in No Accompanied By: girlfriend activities of daily living that may affect Transfer Assistance: None risk of falls: Patient Identification Verified: Yes Signs or symptoms of abuse/neglect since last visito No Secondary Verification Process Completed: Yes Hospitalized since last visit: No Patient Requires Transmission-Based Precautions: No Implantable device outside of the clinic excluding No Patient Has Alerts: No cellular tissue based products placed in the center since last visit: Has Dressing in Place as Prescribed: Yes Pain Present Now: Yes Electronic Signature(s) Signed: 02/18/2022 5:43:09 PM By: Baruch Gouty RN, BSN Entered By: Baruch Gouty on 02/18/2022 12:57:37 -------------------------------------------------------------------------------- Encounter Discharge Information Details Patient Name: Date of Service: [name], [name] 02/18/2022 12:30 PM Medical Record Number: [id number] Patient Account Number: [id number] Date of Birth/Sex: Treating RN: [birth year] (56 y.o. [name] Primary Care Morayma Godown: [name] Other Clinician: Referring Teja Judice: Treating Caige Almeda/Extender: [name] in Treatment: 5 Encounter Discharge Information Items Post Procedure  Vitals Discharge Condition: Stable Temperature (F): 97.8 Ambulatory Status: Wheelchair Pulse (bpm): 67 Discharge Destination: Home Respiratory Rate (breaths/min): 18 Transportation: Private Auto Blood Pressure (mmHg): 141/67 Accompanied By: girlfriend Schedule Follow-up Appointment: Yes Clinical Summary of Care: Patient Declined Electronic Signature(s) Signed: 02/18/2022 5:43:09 PM By: Baruch Gouty RN, BSN Entered By: Baruch Gouty on 02/18/2022 13:50:45 -------------------------------------------------------------------------------- Lower Extremity Assessment Details Patient Name: Date of Service: [place], [name] 02/18/2022 12:30 PM Medical Record Number: [id number] Patient Account Number: [id number] Date of Birth/Sex: Treating RN: 08-15-1966 (55 y.o. [name] Primary Care Diara Chaudhari: [name] Other Clinician: Referring Aleksis Jiggetts: Treating Horton Ellithorpe/Extender: [name] in Treatment: 5 Electronic Signature(s) Signed: 02/18/2022 5:43:09 PM By: Baruch Gouty RN, BSN Entered By: Baruch Gouty on 02/18/2022 12:59:39 -------------------------------------------------------------------------------- Multi Wound Chart Details Patient Name: Date of Service: [name], [name] 02/18/2022 12:30 PM Medical Record Number: [id number] Patient Account Number: [id number] Date of Birth/Sex: Treating RN: 14-Oct-1965 (56 y.o. [name] Primary Care Aryon Nham: [name] Other Clinician: Referring Oasis Goehring: Treating [name]/Extender: [name] in Treatment: 5 Vital Signs Height(in): Capillary Blood Glucose(mg/dl): 175 Weight(lbs): Pulse(bpm): 32 Body Mass Index(BMI): Blood Pressure(mmHg): 141/87 Temperature(F): 97.8 Respiratory Rate(breaths/min): 18 Photos: [N/A:N/A] Sacrum N/A N/A Wound Location: Pressure Injury N/A N/A Wounding Event: Pressure Ulcer N/A N/A Primary  Etiology: Congestive Heart Failure, N/A N/A Comorbid History: Hypertension, Type II Diabetes, Osteomyelitis, Confinement Anxiety 07/29/2021 N/A N/A Date Acquired: 5 N/A N/A Weeks of Treatment: Open N/A N/A Wound Status: No N/A N/A Wound Recurrence: 2.7x2.8x1.5 N/A N/A Measurements L x W x D (cm) 5.938 N/A N/A A (cm) : rea 8.906 N/A N/A Volume (cm) : -0.80% N/A N/A % Reduction in A rea: -37.40% N/A N/A % Reduction in Volume: 12 Starting Position 1 (o'clock): 6 Ending Position 1 (o'clock): 1.3 Maximum Distance 1 (cm): Yes N/A N/A Undermining: Category/Stage IV N/A N/A Classification: Medium N/A N/A Exudate A mount: Serous N/A N/A Exudate Type: amber N/A N/A Exudate Color: Yes N/A N/A Foul Odor A Cleansing: fter  No N/A N/A Odor Anticipated Due to Product Use: Well defined, not attached N/A N/A Wound Margin: Small (1-33%) N/A N/A Granulation A mount: Pink, Pale, Hyper-granulation N/A N/A Granulation Quality: Large (67-100%) N/A N/A Necrotic Amount: Fat Layer (Subcutaneous Tissue): Yes N/A N/A Exposed Structures: Fascia: No Tendon: No Muscle: No Joint: No Bone: No Small (1-33%) N/A N/A Epithelialization: Debridement - Excisional N/A N/A Debridement: Pre-procedure Verification/Time Out 13:20 N/A N/A Taken: Other N/A N/A Pain Control: Subcutaneous, Slough N/A N/A Tissue Debrided: Skin/Subcutaneous Tissue N/A N/A Level: 7.56 N/A N/A Debridement A (sq cm): rea Curette N/A N/A Instrument: Minimum N/A N/A Bleeding: Silver Nitrate N/A N/A Hemostasis A chieved: 3 N/A N/A Procedural Pain: 3 N/A N/A Post Procedural Pain: Procedure was tolerated well N/A N/A Debridement Treatment Response: 2.7x2.8x1.5 N/A N/A Post Debridement Measurements L x W x D (cm) 8.906 N/A N/A Post Debridement Volume: (cm) Category/Stage IV N/A N/A Post Debridement Stage: Debridement N/A N/A Procedures Performed: Negative Pressure Wound Therapy Maintenance  (NPWT) Treatment Notes Electronic Signature(s) Signed: 02/18/2022 1:27:56 PM By: [name] MD FACS Signed: 02/18/2022 5:43:09 PM By: Baruch Gouty RN, BSN Entered By: [name] on 02/18/2022 13:27:56 -------------------------------------------------------------------------------- Multi-Disciplinary Care Plan Details Patient Name: Date of Service: [facility], [name] 02/18/2022 12:30 PM Medical Record Number: [id number] Patient Account Number: [id number] Date of Birth/Sex: Treating RN: [birth year] (56 y.o. [name] Primary Care Esau Fridman: [name] Other Clinician: Referring Tarris Delbene: Treating Stephanieann Popescu/Extender: [name] in Treatment: 5 Multidisciplinary Care Plan reviewed with physician Active Inactive Nutrition Nursing Diagnoses: Impaired glucose control: actual or potential Potential for alteratiion in Nutrition/Potential for imbalanced nutrition Goals: Patient/caregiver will maintain therapeutic glucose control Date Initiated: 01/08/2022 Target Resolution Date: 03/05/2022 Goal Status: Active Interventions: Assess HgA1c results as ordered upon admission and as needed Assess patient nutrition upon admission and as needed per policy Provide education on elevated blood sugars and impact on wound healing Treatment Activities: Dietary management education, guidance and counseling : 01/08/2022 Patient referred to Primary Care Physician for further nutritional evaluation : 01/08/2022 Notes: Pressure Nursing Diagnoses: Knowledge deficit related to causes and risk factors for pressure ulcer development Knowledge deficit related to management of pressures ulcers Potential for impaired tissue integrity related to pressure, friction, moisture, and shear Goals: Patient/caregiver will verbalize understanding of pressure ulcer management Date Initiated: 01/08/2022 Target Resolution Date: 03/05/2022 Goal Status:  Active Interventions: Assess: immobility, friction, shearing, incontinence upon admission and as needed Assess offloading mechanisms upon admission and as needed Assess potential for pressure ulcer upon admission and as needed Notes: Wound/Skin Impairment Nursing Diagnoses: Impaired tissue integrity Knowledge deficit related to ulceration/compromised skin integrity Goals: Patient/caregiver will verbalize understanding of skin care regimen Date Initiated: 01/08/2022 Target Resolution Date: 03/05/2022 Goal Status: Active Ulcer/skin breakdown will have a volume reduction of 30% by week 4 Date Initiated: 01/08/2022 Date Inactivated: 02/18/2022 Target Resolution Date: 02/05/2022 Goal Status: Unmet Unmet Reason: VAC leaking Ulcer/skin breakdown will have a volume reduction of 50% by week 8 Date Initiated: 02/18/2022 Target Resolution Date: 03/05/2022 Goal Status: Active Interventions: Assess patient/caregiver ability to obtain necessary supplies Assess patient/caregiver ability to perform ulcer/skin care regimen upon admission and as needed Assess ulceration(s) every visit Provide education on ulcer and skin care Treatment Activities: Skin care regimen initiated : 01/08/2022 Topical wound management initiated : 01/08/2022 Notes: Electronic Signature(s) Signed: 02/18/2022 5:43:09 PM By: Baruch Gouty RN, BSN Entered By: Baruch Gouty on 02/18/2022 13:11:55 -------------------------------------------------------------------------------- Negative Pressure Wound Therapy Maintenance (NPWT) Details Patient Name: Date of Service: [place]  NN, [name] 02/18/2022 12:30 PM Medical Record Number: [id number] Patient Account Number: [id number] Date of Birth/Sex: Treating RN: 12/28/1965 (56 y.o. [name] Primary Care Imanol Bihl: [name] Other Clinician: Referring Ellagrace Yoshida: Treating Zaine [name]/Extender: [name] in Treatment: 5 NPWT Maintenance  Performed for: Wound #1 Sacrum Performed By: Baruch Gouty, RN Type: VAC System Coverage Size (sq cm): 7.56 Pressure Type: Constant Pressure Setting: 125 mmHG Drain Type: None Primary Contact: Other : gentamicin Sponge/Dressing Type: Foam, Black Date Initiated: 01/09/2022 Dressing Removed: No Quantity of Sponges/Gauze Removed: 1 Canister Changed: No Dressing Reapplied: No Quantity of Sponges/Gauze Inserted: 1 Respones T Treatment: o good Days On NPWT : 41 Post Procedure Diagnosis Same as Pre-procedure Electronic Signature(s) Signed: 02/18/2022 5:43:09 PM By: Baruch Gouty RN, BSN Entered By: Baruch Gouty on 02/18/2022 13:26:44 -------------------------------------------------------------------------------- Pain Assessment Details Patient Name: Date of Service: [name], [name] 02/18/2022 12:30 PM Medical Record Number: [id number] Patient Account Number: [id number] Date of Birth/Sex: Treating RN: 1966/03/17 (56 y.o. [name] Primary Care Jamesrobert Ohanesian: [name] Other Clinician: Referring Milton Streicher: Treating Ademola Vert/Extender: [name] in Treatment: 5 Active Problems Location of Pain Severity and Description of Pain Patient Has Paino Yes Site Locations Pain Location: Pain in Ulcers With Dressing Change: Yes Duration of the Pain. Constant / Intermittento Constant Rate the pain. Current Pain Level: 7 Worst Pain Level: 8 Least Pain Level: 5 Character of Pain Describe the Pain: Aching, Tender Pain Management and Medication Current Pain Management: Medication: Yes Cold Application: No Rest: No Massage: No Activity: No T.E.N.S.: No Heat Application: No Leg drop or elevation: No Other: reposition Is the Current Pain Management Adequate: Inadequate How does your wound impact your activities of daily livingo Sleep: Yes Bathing: No Appetite: No Relationship With Others: No Bladder Continence: No Emotions:  Yes Bowel Continence: No Hobbies: No Toileting: No Dressing: No Electronic Signature(s) Signed: 02/18/2022 5:43:09 PM By: Baruch Gouty RN, BSN Entered By: Baruch Gouty on 02/18/2022 12:59:33 -------------------------------------------------------------------------------- Patient/Caregiver Education Details Patient Name: Date of Service: [name], [name] 6/14/2023andnbsp12:30 PM Medical Record Number: [id number] Patient Account Number: [id number] Date of Birth/Gender: Treating RN: 1965-11-07 (56 y.o. [name] Primary Care Physician: [name] Other Clinician: Referring Physician: Treating Physician/Extender: [name] in Treatment: 5 Education Assessment Education Provided To: Patient Education Topics Provided Elevated Blood Sugar/ Impact on Healing: Methods: Explain/Verbal Responses: Reinforcements needed, State content correctly Pressure: Methods: Explain/Verbal Responses: Reinforcements needed, State content correctly Wound/Skin Impairment: Methods: Explain/Verbal Responses: Reinforcements needed, State content correctly Electronic Signature(s) Signed: 02/18/2022 5:43:09 PM By: Baruch Gouty RN, BSN Entered By: Baruch Gouty on 02/18/2022 13:12:22 -------------------------------------------------------------------------------- Wound Assessment Details Patient Name: Date of Service: [name], [name] 02/18/2022 12:30 PM Medical Record Number: [id number] Patient Account Number: [id number] Date of Birth/Sex: Treating RN: 1966-04-19 (56 y.o. [name] Primary Care Sabree Nuon: [name] Other Clinician: Referring Damarkus Balis: Treating Delanda Bulluck/Extender: [name] in Treatment: 5 Wound Status Wound Number: 1 Primary Pressure Ulcer Etiology: Wound Location: Sacrum Wound Open Wounding Event: Pressure Injury Status: Date Acquired: 07/29/2021 Comorbid Congestive  Heart Failure, Hypertension, Type II Diabetes, Weeks Of Treatment: 5 History: Osteomyelitis, Confinement Anxiety Clustered Wound: No Photos Wound Measurements Length: (cm) 2.7 Width: (cm) 2.8 Depth: (cm) 1.5 Area: (cm) 5.938 Volume: (cm) 8.906 % Reduction in Area: -0.8% % Reduction in Volume: -37.4% Epithelialization: Small (1-33%) Tunneling: No Undermining: Yes Starting Position (o'clock): 12 Ending Position (o'clock): 6 Maximum Distance: (cm) 1.3 Wound Description Classification: Category/Stage  IV Wound Margin: Well defined, not attached Exudate Amount: Medium Exudate Type: Serous Exudate Color: amber Foul Odor After Cleansing: Yes Due to Product Use: No Slough/Fibrino Yes Wound Bed Granulation Amount: Small (1-33%) Exposed Structure Granulation Quality: Pink, Pale, Hyper-granulation Fascia Exposed: No Necrotic Amount: Large (67-100%) Fat Layer (Subcutaneous Tissue) Exposed: Yes Necrotic Quality: Adherent Slough Tendon Exposed: No Muscle Exposed: No Joint Exposed: No Bone Exposed: No Treatment Notes Wound #1 (Sacrum) Cleanser Peri-Wound Care Topical Gentamicin Discharge Instruction: apply thin layer to wound bed with dressing changes Primary Dressing VAC Discharge Instruction: be sure to place sponge in undermining and drape under the bridge Secondary Dressing Secured With Compression Wrap Compression Stockings Add-Ons Electronic Signature(s) Signed: 02/18/2022 5:43:09 PM By: Baruch Gouty RN, BSN Entered By: Baruch Gouty on 02/18/2022 13:12:59 -------------------------------------------------------------------------------- Vitals Details Patient Name: Date of Service: [name], [name] 02/18/2022 12:30 PM Medical Record Number: [id number] Patient Account Number: [id number] Date of Birth/Sex: Treating RN: [birth year] (56 y.o. [name] Primary Care Saloma Cadena: [name] Other Clinician: Referring Tyann Niehaus: Treating  July Linam/Extender: [name] in Treatment: 5 Vital Signs Time Taken: 12:57 Temperature (F): 97.8 Pulse (bpm): 67 Respiratory Rate (breaths/min): 18 Blood Pressure (mmHg): 141/87 Capillary Blood Glucose (mg/dl): 175 Reference Range: 80 - 120 mg / dl Notes glucose per pt report yesterday Electronic Signature(s) Signed: 02/18/2022 5:43:09 PM By: Baruch Gouty RN, BSN Entered By: Baruch Gouty on 02/18/2022 12:58:22

## 2022-02-25 ENCOUNTER — Encounter (HOSPITAL_BASED_OUTPATIENT_CLINIC_OR_DEPARTMENT_OTHER): Payer: Medicare HMO | Admitting: General Surgery

## 2022-02-25 DIAGNOSIS — E119 Type 2 diabetes mellitus without complications: Secondary | ICD-10-CM | POA: Diagnosis not present

## 2022-02-25 DIAGNOSIS — I509 Heart failure, unspecified: Secondary | ICD-10-CM | POA: Diagnosis not present

## 2022-02-25 DIAGNOSIS — G8929 Other chronic pain: Secondary | ICD-10-CM | POA: Diagnosis not present

## 2022-02-25 DIAGNOSIS — I110 Hypertensive heart disease with heart failure: Secondary | ICD-10-CM | POA: Diagnosis not present

## 2022-02-25 DIAGNOSIS — Z89512 Acquired absence of left leg below knee: Secondary | ICD-10-CM | POA: Diagnosis not present

## 2022-02-25 DIAGNOSIS — Z89511 Acquired absence of right leg below knee: Secondary | ICD-10-CM | POA: Diagnosis not present

## 2022-02-25 DIAGNOSIS — L89154 Pressure ulcer of sacral region, stage 4: Secondary | ICD-10-CM | POA: Diagnosis not present

## 2022-02-25 NOTE — Progress Notes (Signed)
AUSTAN, NICHOLL ([id number]) Visit Report for 02/25/2022 Chief Complaint Document Details Patient Name: Date of Service: [place], [name] 02/25/2022 1:15 PM Medical Record Number: [id number] Patient Account Number: [id number] Date of Birth/Sex: Treating RN: [birth year] (56 y.o. [name] Primary Care Provider: [name] Other Clinician: Referring Provider: Treating Provider/Extender: [name] in Treatment: 6 Information Obtained from: Patient Chief Complaint Patient is at the clinic for treatment of an open pressure ulcer Electronic Signature(s) Signed: 02/25/2022 1:59:33 PM By: [name] MD FACS Entered By: [name] on 02/25/2022 13:59:33 -------------------------------------------------------------------------------- Debridement Details Patient Name: Date of Service: [name], [name] 02/25/2022 1:15 PM Medical Record Number: [id number] Patient Account Number: [id number] Date of Birth/Sex: Treating RN: 1966-07-02 (56 y.o. [name] Primary Care Provider: [name] Other Clinician: Referring Provider: Treating Provider/Extender: [name] in Treatment: 6 Debridement Performed for Assessment: Wound #1 Sacrum Performed By: Physician [name], MD Debridement Type: Debridement Level of Consciousness (Pre-procedure): Awake and Alert Pre-procedure Verification/Time Out Yes - 13:50 Taken: Pain Control: Other : benzocaine 20% spray T Area Debrided (L x W): otal 2.4 (cm) x 2.4 (cm) = 5.76 (cm) Tissue and other material debrided: Non-Viable, Skin: Epidermis Level: Skin/Epidermis Debridement Description: Selective/Open Wound Instrument: Curette Bleeding: Minimum Hemostasis Achieved: Silver Nitrate Procedural Pain: 0 Post Procedural Pain: 0 Response to Treatment: Procedure was tolerated well Level of Consciousness (Post- Awake and Alert procedure): Post  Debridement Measurements of Total Wound Length: (cm) 2.4 Stage: Category/Stage IV Width: (cm) 2.4 Depth: (cm) 1.3 Volume: (cm) 5.881 Character of Wound/Ulcer Post Debridement: Improved Post Procedure Diagnosis Same as Pre-procedure Electronic Signature(s) Signed: 02/25/2022 3:36:19 PM By: [name] MD FACS Signed: 02/25/2022 6:21:16 PM By: Baruch Gouty RN, BSN Entered By: Baruch Gouty on 02/25/2022 13:57:46 -------------------------------------------------------------------------------- HPI Details Patient Name: Date of Service: [name], [name] 02/25/2022 1:15 PM Medical Record Number: [id number] Patient Account Number: [id number] Date of Birth/Sex: Treating RN: 12-19-1965 (56 y.o. [name] Primary Care Provider: [name] Other Clinician: Referring Provider: Treating Provider/Extender: [name] in Treatment: 6 History of Present Illness HPI Description: ADMISSION 01/08/2022 This is a 56 year old male with a past medical history notable for type 2 diabetes mellitus (last A1c was 8.6) congestive heart failure, hypertension, chronic pain, and bilateral below-knee amputations. His most recent amputation was in November 2022. While he was in the hospital, he developed a sacral pressure ulcer. He was subsequently in a skilled nursing facility for some time. He was discharged with home health and had been in a wound VAC, but was then admitted to the hospital last week when the wound appeared to be worsening. Apparently the periwound skin was macerated and the device that he had been using was leaking quite a bit. Evaluation while in the hospital included a consultation with infectious disease who did not think he had any evidence of osteomyelitis, plastic surgery who felt that he was not an appropriate flap candidate (their note also states that he is not interested in a flap) and wound care who took him out of the wound VAC and  initiated wet-to-dry dressing changes. He has a new wound VAC from [facility] on order, anticipated delivery today. The wound itself is fairly small and isolated to the sacrum. There is muscle exposed. No bone is appreciated but it is palpable beneath the surface. The muscle itself is bit pale and there is heaped up epibole around the wound edges. No significant odor or drainage. 01/14/2022:  His wound VAC was not initiated until this past Friday. He has not had any issues with the VAC but today we found that the bridge foam was applied directly to the skin rather than over a layer of adhesive drape. His home health nurse also requested that we consider applying silver collagen to the wound bed in addition to the VAC. There is still a little bit of heaped up senescent skin around the wound periphery. No significant change to the wound dimensions. 01/21/2022: No significant change to the wound dimensions. The senescent skin has not reaccumulated. The periwound skin remains a bit macerated but without any obvious breakdown. The wound surface itself has a shiny appearance with a little bit of slough accumulation; no true robust granulation tissue at this time. 01/28/2022: The wound is about the same size but a little bit shallower. There is a little bit of senescent skin reaccumulation at the cranial aspect. The periwound skin is red but not macerated and without any tissue breakdown. The wound still does not have the most robust surface. There is a bit of slough accumulation. 02/04/2022: The wound is a bit smaller and the undermining has come in somewhat. There continues to be granulation tissue formation within the wound bed. No significant slough accumulation and his periwound skin is in better condition. 02/18/2022: The wound stinks today. There is no obvious pus but the drainage and wound itself are malodorous. He continues to have heaped up tissue within the wound bed that is rather grayish and not particularly  robust-appearing. He is very angry today with his situation. 02/25/2022: Last week, in response to the odor coming from the wound, I took a culture and prescribed topical gentamicin as well as oral cefdinir. Apparently [facility] contacted him about a topical compounded antibiotic, but he did not realize this and he hung up on them. Today, the odor is no longer present. There is some senescent skin around the wound margins as well as continued heaped up granulation tissue in the center of the wound. The undermining continues to contract. Electronic Signature(s) Signed: 02/25/2022 2:16:57 PM By: [name] MD FACS Entered By: [name] on 02/25/2022 14:16:57 -------------------------------------------------------------------------------- Physical Exam Details Patient Name: Date of Service: [name], [name] 02/25/2022 1:15 PM Medical Record Number: [id number] Patient Account Number: [id number] Date of Birth/Sex: Treating RN: 08/05/1966 (56 y.o. [name] Primary Care Provider: [name] Other Clinician: Referring Provider: Treating Provider/Extender: [name] in Treatment: 6 Constitutional Slightly hypertensive. . . . No acute distress.Marland Kitchen Respiratory Normal work of breathing on room air.. Notes 02/25/2022: Today, the odor is no longer present. There is some senescent skin around the wound margins as well as continued heaped up granulation tissue in the center of the wound. The undermining continues to contract. Electronic Signature(s) Signed: 02/25/2022 2:21:41 PM By: [name] MD FACS Entered By: [name] on 02/25/2022 14:21:41 -------------------------------------------------------------------------------- Physician Orders Details Patient Name: Date of Service: [name], [name] 02/25/2022 1:15 PM Medical Record Number: [id number] Patient Account Number: [id number] Date of Birth/Sex: Treating RN: 1966/08/29 (56  y.o. [name] Primary Care Provider: [name] Other Clinician: Referring Provider: Treating Provider/Extender: [name] in Treatment: 6 Verbal / Phone Orders: No Diagnosis Coding ICD-10 Coding Code Description L89.154 Pressure ulcer of sacral region, stage 4 Z89.512 Acquired absence of left leg below knee Z89.511 Acquired absence of right leg below knee Follow-up Appointments ppointment in 1 week. - Dr. Lucienne Capers with [facility]  A Wednesday 03/04/22 @ 2:45 pm Bathing/ Shower/ Hygiene May shower with protection but do not get wound dressing(s) wet. Negative Presssure Wound Therapy Wound #1 Sacrum Wound Vac to wound continuously at 174m/hg pressure Black Foam Off-Loading Gel mattress overlay (Group 1) Turn and reposition every 2 hours Home Health Dressing [place] / Wednesday / Friday except [facility] Be sure no sponge directly on the skin, place drape under the bridge Other Home Health Orders/Instructions: - [facility] Wound Treatment Wound #1 - Sacrum Topical: Gentamicin 3 x Per Week/30 Days Discharge Instructions: apply thin layer to wound bed with dressing changes Prim Dressing: VAC 3 x Per Week/30 Days ary Discharge Instructions: be sure to place sponge in undermining and drape under the bridge Electronic Signature(s) Signed: 02/25/2022 3:36:19 PM By: CFredirick MaudlinMD FACS Entered By: CFredirick Maudlinon 02/25/2022 14:21:54 -------------------------------------------------------------------------------- Problem List Details Patient Name: Date of Service: [place] 1:15 PM Medical Record Number: [id number] Account Number: [id number] of Birth/Sex: Treating RN: [phone](56y.o. MErnestene MentionPrimary Care Provider: HJeanie SewerOther Clinician: Referring Provider: Treating  Provider/Extender: CChristie Nottinghamin Treatment: 6 Active Problems ICD-10 Encounter Code Description Active Date MDM Diagnosis L89.154 Pressure ulcer of sacral region, stage 4 01/08/2022 No Yes Z89.512 Acquired absence of left leg below knee 01/08/2022 No Yes Z89.511 Acquired absence of right leg below knee 01/08/2022 No Yes Inactive Problems Resolved Problems Electronic Signature(s) Signed: 02/25/2022 1:58:24 PM By: CFredirick MaudlinMD FACS Entered By: CFredirick Maudlinon 02/25/2022 13:58:24 -------------------------------------------------------------------------------- Progress Note Details Patient Name: Date of Service: HCicero Duck [name] 02/25/2022 1:15 PM Medical Record Number: [id number] Account Number: [id number] of Birth/Sex: Treating RN: [phone](56y.o. MErnestene MentionPrimary Care Provider: HJeanie SewerOther Clinician: Referring Provider: Treating Provider/Extender: CChristie Nottinghamin Treatment: 6 Subjective Chief Complaint Information obtained from Patient Patient is at the clinic for treatment of an open pressure ulcer History of Present Illness (HPI) ADMISSION 01/08/2022 This is a 56year old male with a past medical history notable for type 2 diabetes mellitus (last A1c was 8.6) congestive heart failure, hypertension, chronic pain, and bilateral below-knee amputations. His most recent amputation was in November 2022. While he was in the hospital, he developed a sacral pressure ulcer. He was subsequently in a skilled nursing facility for some time. He was discharged with home health and had been in a wound VAC, but was then admitted to the hospital last week when the wound appeared to be worsening. Apparently the periwound skin was macerated and the device that he had been using was leaking quite a bit. Evaluation while in the hospital included a consultation with infectious disease who did not  think he had any evidence of osteomyelitis, plastic surgery who felt that he was not an appropriate flap candidate (their note also states that he is not interested in a flap) and wound care who took him out of the wound VAC and initiated wet-to-dry dressing changes. He has a new wound VAC from [facility] on order, anticipated delivery today. The wound itself is fairly small and isolated to the sacrum. There is muscle exposed. No bone is appreciated but it is palpable beneath the surface. The muscle itself is bit pale and there is heaped up epibole around the wound edges. No significant odor or drainage. 01/14/2022: His wound VAC was not initiated until this  past Friday. He has not had any issues with the VAC but today we found that the bridge foam was applied directly to the skin rather than over a layer of adhesive drape. His home health nurse also requested that we consider applying silver collagen to the wound bed in addition to the VAC. There is still a little bit of heaped up senescent skin around the wound periphery. No significant change to the wound dimensions. 01/21/2022: No significant change to the wound dimensions. The senescent skin has not reaccumulated. The periwound skin remains a bit macerated but without any obvious breakdown. The wound surface itself has a shiny appearance with a little bit of slough accumulation; no true robust granulation tissue at this time. 01/28/2022: The wound is about the same size but a little bit shallower. There is a little bit of senescent skin reaccumulation at the cranial aspect. The periwound skin is red but not macerated and without any tissue breakdown. The wound still does not have the most robust surface. There is a bit of slough accumulation. 02/04/2022: The wound is a bit smaller and the undermining has come in somewhat. There continues to be granulation tissue formation within the wound bed. No significant slough accumulation and his periwound skin is in  better condition. 02/18/2022: The wound stinks today. There is no obvious pus but the drainage and wound itself are malodorous. He continues to have heaped up tissue within the wound bed that is rather grayish and not particularly robust-appearing. He is very angry today with his situation. 02/25/2022: Last week, in response to the odor coming from the wound, I took a culture and prescribed topical gentamicin as well as oral cefdinir. Apparently [facility] contacted him about a topical compounded antibiotic, but he did not realize this and he hung up on them. Today, the odor is no longer present. There is some senescent skin around the wound margins as well as continued heaped up granulation tissue in the center of the wound. The undermining continues to contract. Patient History Information obtained from Patient, Caregiver, Chart. Family History Cancer - Father, Heart Disease - Father,Paternal Grandparents, Hypertension - Father, No family history of Diabetes, Hereditary Spherocytosis, Kidney Disease, Lung Disease, Seizures, Stroke, Thyroid Problems, Tuberculosis. Social History Never smoker, Marital Status - Divorced, Alcohol Use - Never, Drug Use - No History, Caffeine Use - Moderate - coffee. Medical History Eyes Denies history of Cataracts, Glaucoma, Optic Neuritis Ear/Nose/Mouth/Throat Denies history of Chronic sinus problems/congestion, Middle ear problems Cardiovascular Patient has history of Congestive Heart Failure, Hypertension Endocrine Patient has history of Type II Diabetes Denies history of Type I Diabetes Genitourinary Denies history of End Stage Renal Disease Integumentary (Skin) Denies history of History of Burn Musculoskeletal Patient has history of Osteomyelitis - S1 and coccyx Oncologic Denies history of Received Chemotherapy, Received Radiation Psychiatric Patient has history of Confinement Anxiety Denies history of Anorexia/bulimia Hospitalization/Surgery History  - bil BKA. Medical A Surgical History Notes nd Respiratory pulmonary nodules Genitourinary renal mass, urinary hesitancy Musculoskeletal discitis of thoracic region Objective Constitutional Slightly hypertensive. No acute distress.. Vitals Time Taken: 1:32 PM, Temperature: 97.6 F, Pulse: 74 bpm, Respiratory Rate: 18 breaths/min, Blood Pressure: 145/77 mmHg, Capillary Blood Glucose: 175 mg/dl. General Notes: glucose per pt report this am Respiratory Normal work of breathing on room air.. General Notes: 02/25/2022: T oday, the odor is no longer present. There is some senescent skin around the wound margins as well as continued heaped up granulation tissue in the center of the wound. The  undermining continues to contract. Integumentary (Hair, Skin) Wound #1 status is Open. Original cause of wound was Pressure Injury. The date acquired was: 07/29/2021. The wound has been in treatment 6 weeks. The wound is located on the Sacrum. The wound measures 2.4cm length x 2.4cm width x 1.3cm depth; 4.524cm^2 area and 5.881cm^3 volume. There is Fat Layer (Subcutaneous Tissue) exposed. There is no tunneling noted, however, there is undermining starting at 11:00 and ending at 6:00 with a maximum distance of 1.7cm. There is a medium amount of serous drainage noted. The wound margin is epibole. There is medium (34-66%) pink, hyper - granulation within the wound bed. There is a medium (34-66%) amount of necrotic tissue within the wound bed including Adherent Slough. 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 There was a Selective/Open Wound Skin/Epidermis Debridement with a total area of 5.76 sq cm performed by [name], MD. With the following instrument(s): Curette to remove  Non-Viable tissue/material. Material removed includes Skin: Epidermis after achieving pain control using Other (benzocaine 20% spray). No specimens were taken. A time out was conducted at 13:50, prior to the start of the procedure. A Minimum amount of bleeding was controlled with Silver Nitrate. The procedure was tolerated well with a pain level of 0 throughout and a pain level of 0 following the procedure. Post Debridement Measurements: 2.4cm length x 2.4cm width x 1.3cm depth; 5.881cm^3 volume. Post debridement Stage noted as Category/Stage IV. Character of Wound/Ulcer Post Debridement is improved. Post procedure Diagnosis Wound #1: Same as Pre-Procedure Plan Follow-up Appointments: Return Appointment in 1 week. - Dr. Lucienne Capers with [facility] Wednesday 03/04/22 @ 2:45 pm Bathing/ Shower/ Hygiene: May shower with protection but do not get wound dressing(s) wet. Negative Presssure Wound Therapy: Wound #1 Sacrum: Wound Vac to wound continuously at 178m/hg pressure Black Foam Off-Loading: Gel mattress overlay (Group 1) Turn and reposition every 2 hours Home Health: Dressing changes to be completed [place] / Wednesday / Friday [facility] Be sure no sponge directly on the skin, place drape under the bridge Other Home Health Orders/Instructions: - [facility] WOUND #1: - Sacrum Wound Laterality: Topical: Gentamicin 3 x Per Week/30 Days Discharge Instructions: apply thin layer to wound bed with dressing changes Prim Dressing: VAC 3 x Per Week/30 Days ary Discharge Instructions: be sure to place sponge in undermining and drape under the bridge 02/25/2022: Today, the odor is no longer present. There is some senescent skin around the wound margins as well as continued heaped up granulation tissue in the center of the wound. The undermining continues to contract. I used a curette to debride senescent skin from the wound margins. I  then used silver nitrate to chemically cauterize the heaped up granulation tissue. We will continue topical gentamicin [facility]. He will finish his course of oral cefdinir. Follow-up in 1 week. Electronic Signature(s) Signed: 02/25/2022 2:22:42 PM By: CFredirick MaudlinMD FACS Entered By: CFredirick Maudlinon 02/25/2022 14:22:42 -------------------------------------------------------------------------------- HxROS Details Patient Name: Date of Service: HCicero Duck [name] 02/25/2022 1:15 PM Medical Record Number: [id number] Account Number: [id number] of Birth/Sex: Treating RN: [phone](57y.o. MErnestene MentionPrimary Care Provider: HJeanie SewerOther Clinician: Referring Provider: Treating Provider/Extender: CEligah East  Purvis Kilts in Treatment: 6 Information Obtained From Patient Caregiver Chart Eyes Medical History: Negative for: Cataracts; Glaucoma; Optic Neuritis Ear/Nose/Mouth/Throat Medical History: Negative for: Chronic sinus problems/congestion; Middle ear problems Respiratory Medical History: Past Medical History Notes: pulmonary nodules Cardiovascular Medical History: Positive for: Congestive Heart Failure; Hypertension Endocrine Medical History: Positive for: Type II Diabetes Negative for: Type I Diabetes Time with diabetes: since 2004 Treated with: Insulin Blood sugar tested every day: Yes Tested : 4 times per day Genitourinary Medical History: Negative for: End Stage Renal Disease Past Medical History Notes: renal mass, urinary hesitancy Integumentary (Skin) Medical History: Negative for: History of Burn Musculoskeletal Medical History: Positive for: Osteomyelitis - S1 and coccyx Past Medical History Notes: discitis of thoracic region Oncologic Medical History: Negative for: Received Chemotherapy; Received Radiation Psychiatric Medical History: Positive for: Confinement Anxiety Negative for:  Anorexia/bulimia Immunizations Pneumococcal Vaccine: Received Pneumococcal Vaccination: No Implantable Devices No devices added Hospitalization / Surgery History Type of Hospitalization/Surgery bil BKA Family and Social History Cancer: Yes - Father; Diabetes: No; Heart Disease: Yes - Father,Paternal Grandparents; Hereditary Spherocytosis: No; Hypertension: Yes - Father; Kidney Disease: No; Lung Disease: No; Seizures: No; Stroke: No; Thyroid Problems: No; Tuberculosis: No; Never smoker; Marital Status - Divorced; Alcohol Use: Never; Drug Use: No History; Caffeine Use: Moderate - coffee; Financial Concerns: No; Food, Clothing or Shelter Needs: No; Support System Lacking: No; Transportation Concerns: Yes - hurts to transfer Electronic Signature(s) Signed: 02/25/2022 3:36:19 PM By: [name] MD FACS Signed: 02/25/2022 6:21:16 PM By: Baruch Gouty RN, BSN Entered By: [name] on 02/25/2022 14:17:02 -------------------------------------------------------------------------------- [place] Details Patient Name: Date of Service: [name], [name] 02/25/2022 Medical Record Number: [id number] Patient Account Number: [id number] Date of Birth/Sex: Treating RN: 04-09-1966 (56 y.o. [name] Primary Care Provider: [name] Other Clinician: Referring Provider: Treating Provider/Extender: [name] in Treatment: 6 Diagnosis Coding ICD-10 Codes Code Description [phone] Pressure ulcer of sacral region, stage 4 Z89.512 Acquired absence of left leg below knee Z89.511 Acquired absence of right leg below knee Facility Procedures CPT4 Code: [id number] Description: 29476 - DEBRIDE WOUND 1ST 20 SQ CM OR < ICD-10 Diagnosis Description L89.154 Pressure ulcer of sacral region, stage 4 Modifier: Quantity: 1 CPT4 Code: [id number] Description: 65681 - WOUND VAC-50 SQ CM OR LESS Modifier: 59 Quantity: 1 CPT4 Code: 27517001 Description: 74944  - CHEM CAUT GRANULATION TISS ICD-10 Diagnosis Description L89.154 Pressure ulcer of sacral region, stage 4 Modifier: Quantity: 1 Physician Procedures : CPT4 Code Description Modifier [id number] - WC PHYS LEVEL 3 - EST PT 25 ICD-10 Diagnosis Description L89.154 Pressure ulcer of sacral region, stage 4 Z89.512 Acquired absence of left leg below knee Z89.511 Acquired absence of right leg below knee Quantity: 1 : [id number] 17793 - WC PHYS DEBR WO ANESTH 20 SQ CM 1 ICD-10 Diagnosis Description L89.154 Pressure ulcer of sacral region, stage 4 Quantity: : [id number] 33007 - WC PHYS CHEM CAUT GRAN TISSUE 1 ICD-10 Diagnosis Description L89.154 Pressure ulcer of sacral region, stage 4 Quantity: Electronic Signature(s) Signed: 02/25/2022 2:25:35 PM By: [name] MD FACS Entered By: [name] on 02/25/2022 14:25:35

## 2022-02-25 NOTE — Progress Notes (Signed)
ARSENIY, TOOMEY ([id number]) Visit Report for 02/25/2022 Arrival Information Details Patient Name: Date of Service: [place], [name] 02/25/2022 1:15 PM Medical Record Number: [id number] Patient Account Number: [id number] Date of Birth/Sex: Treating RN: [birth year] (56 y.o. [name] Primary Care Alyshia Kernan: [name] Other Clinician: Referring Jariyah Hackley: Treating Aparna Vanderweele/Extender: [name] in Treatment: 6 Visit Information History Since Last Visit Added or deleted any medications: No Patient Arrived: Wheel Chair Any new allergies or adverse reactions: No Arrival Time: 13:31 Had a fall or experienced change in No Accompanied By: girlfriend activities of daily living that may affect Transfer Assistance: None risk of falls: Patient Identification Verified: Yes Signs or symptoms of abuse/neglect since last visito No Secondary Verification Process Completed: Yes Hospitalized since last visit: No Patient Requires Transmission-Based Precautions: No Implantable device outside of the clinic excluding No Patient Has Alerts: No cellular tissue based products placed in the center since last visit: Has Dressing in Place as Prescribed: Yes Pain Present Now: Yes Electronic Signature(s) Signed: 02/25/2022 6:21:16 PM By: Baruch Gouty RN, BSN Entered By: Baruch Gouty on 02/25/2022 13:32:14 -------------------------------------------------------------------------------- Encounter Discharge Information Details Patient Name: Date of Service: [name], [name] 02/25/2022 1:15 PM Medical Record Number: [id number] Patient Account Number: [id number] Date of Birth/Sex: Treating RN: 1965/12/14 (56 y.o. [name] Primary Care Darline Faith: [name] Other Clinician: Referring Shyhiem Beeney: Treating Tuwanna Krausz/Extender: [name] in Treatment: 6 Encounter Discharge Information Items Post Procedure  Vitals Discharge Condition: Stable Temperature (F): 97.6 Ambulatory Status: Wheelchair Pulse (bpm): 74 Discharge Destination: Home Respiratory Rate (breaths/min): 18 Transportation: Private Auto Blood Pressure (mmHg): 145/77 Accompanied By: girlfriend Schedule Follow-up Appointment: Yes Clinical Summary of Care: Patient Declined Electronic Signature(s) Signed: 02/25/2022 6:21:16 PM By: Baruch Gouty RN, BSN Entered By: Baruch Gouty on 02/25/2022 14:20:38 -------------------------------------------------------------------------------- Lower Extremity Assessment Details Patient Name: Date of Service: [place], [name] 02/25/2022 1:15 PM Medical Record Number: [id number] Patient Account Number: [id number] Date of Birth/Sex: Treating RN: 18-Jan-1966 (56 y.o. [name] Primary Care Alishah Schulte: [name] Other Clinician: Referring Eleftherios Dudenhoeffer: Treating Rakan Soffer/Extender: [name] in Treatment: 6 Electronic Signature(s) Signed: 02/25/2022 6:21:16 PM By: Baruch Gouty RN, BSN Entered By: Baruch Gouty on 02/25/2022 13:34:08 -------------------------------------------------------------------------------- Multi Wound Chart Details Patient Name: Date of Service: [name], [name] 02/25/2022 1:15 PM Medical Record Number: [id number] Patient Account Number: [id number] Date of Birth/Sex: Treating RN: [birth year] (56 y.o. [name] Primary Care Lucinda Spells: [name] Other Clinician: Referring Ciarah Peace: Treating Armistead Sult/Extender: [name] in Treatment: 6 Vital Signs Height(in): Capillary Blood Glucose(mg/dl): 175 Weight(lbs): Pulse(bpm): 54 Body Mass Index(BMI): Blood Pressure(mmHg): 145/77 Temperature(F): 97.6 Respiratory Rate(breaths/min): 18 Photos: [N/A:N/A] Sacrum N/A N/A Wound Location: Pressure Injury N/A N/A Wounding Event: Pressure Ulcer N/A N/A Primary  Etiology: Congestive Heart Failure, N/A N/A Comorbid History: Hypertension, Type II Diabetes, Osteomyelitis, Confinement Anxiety 07/29/2021 N/A N/A Date Acquired: 6 N/A N/A Weeks of Treatment: Open N/A N/A Wound Status: No N/A N/A Wound Recurrence: 2.4x2.4x1.3 N/A N/A Measurements L x W x D (cm) 4.524 N/A N/A A (cm) : rea 5.881 N/A N/A Volume (cm) : 23.20% N/A N/A % Reduction in A rea: 9.20% N/A N/A % Reduction in Volume: 11 Starting Position 1 (o'clock): 6 Ending Position 1 (o'clock): 1.7 Maximum Distance 1 (cm): Yes N/A N/A Undermining: Category/Stage IV N/A N/A Classification: Medium N/A N/A Exudate A mount: Serous N/A N/A Exudate Type: amber N/A N/A Exudate Color: Epibole N/A N/A Wound Margin: Medium (34-66%) N/A  N/A Granulation Amount: Pink, Hyper-granulation N/A N/A Granulation Quality: Medium (34-66%) N/A N/A Necrotic Amount: Fat Layer (Subcutaneous Tissue): Yes N/A N/A Exposed Structures: Fascia: No Tendon: No Muscle: No Joint: No Bone: No Small (1-33%) N/A N/A Epithelialization: Debridement - Selective/Open Wound N/A N/A Debridement: Pre-procedure Verification/Time Out 13:50 N/A N/A Taken: Other N/A N/A Pain Control: Skin/Epidermis N/A N/A Level: 5.76 N/A N/A Debridement A (sq cm): rea Curette N/A N/A Instrument: Minimum N/A N/A Bleeding: Silver Nitrate N/A N/A Hemostasis A chieved: 0 N/A N/A Procedural Pain: 0 N/A N/A Post Procedural Pain: Procedure was tolerated well N/A N/A Debridement Treatment Response: 2.4x2.4x1.3 N/A N/A Post Debridement Measurements L x W x D (cm) 5.881 N/A N/A Post Debridement Volume: (cm) Category/Stage IV N/A N/A Post Debridement Stage: Debridement N/A N/A Procedures Performed: Negative Pressure Wound Therapy Maintenance (NPWT) Treatment Notes Electronic Signature(s) Signed: 02/25/2022 1:59:27 PM By: [name] MD FACS Signed: 02/25/2022 6:21:16 PM By: Baruch Gouty RN,  BSN Entered By: [name] on 02/25/2022 13:59:27 -------------------------------------------------------------------------------- Multi-Disciplinary Care Plan Details Patient Name: Date of Service: [facility], [name] 02/25/2022 1:15 PM Medical Record Number: [id number] Patient Account Number: [id number] Date of Birth/Sex: Treating RN: [birth year] (56 y.o. [name] Primary Care Oceanna Arruda: [name] Other Clinician: Referring Jessah Danser: Treating Jarrell Armond/Extender: [name] in Treatment: 6 Multidisciplinary Care Plan reviewed with physician Active Inactive Nutrition Nursing Diagnoses: Impaired glucose control: actual or potential Potential for alteratiion in Nutrition/Potential for imbalanced nutrition Goals: Patient/caregiver will maintain therapeutic glucose control Date Initiated: 01/08/2022 Target Resolution Date: 03/05/2022 Goal Status: Active Interventions: Assess HgA1c results as ordered upon admission and as needed Assess patient nutrition upon admission and as needed per policy Provide education on elevated blood sugars and impact on wound healing Treatment Activities: Dietary management education, guidance and counseling : 01/08/2022 Patient referred to Primary Care Physician for further nutritional evaluation : 01/08/2022 Notes: Pressure Nursing Diagnoses: Knowledge deficit related to causes and risk factors for pressure ulcer development Knowledge deficit related to management of pressures ulcers Potential for impaired tissue integrity related to pressure, friction, moisture, and shear Goals: Patient/caregiver will verbalize understanding of pressure ulcer management Date Initiated: 01/08/2022 Target Resolution Date: 03/05/2022 Goal Status: Active Interventions: Assess: immobility, friction, shearing, incontinence upon admission and as needed Assess offloading mechanisms upon admission and as needed Assess potential  for pressure ulcer upon admission and as needed Notes: Wound/Skin Impairment Nursing Diagnoses: Impaired tissue integrity Knowledge deficit related to ulceration/compromised skin integrity Goals: Patient/caregiver will verbalize understanding of skin care regimen Date Initiated: 01/08/2022 Target Resolution Date: 03/05/2022 Goal Status: Active Ulcer/skin breakdown will have a volume reduction of 30% by week 4 Date Initiated: 01/08/2022 Date Inactivated: 02/18/2022 Target Resolution Date: 02/05/2022 Goal Status: Unmet Unmet Reason: VAC leaking Ulcer/skin breakdown will have a volume reduction of 50% by week 8 Date Initiated: 02/18/2022 Target Resolution Date: 03/05/2022 Goal Status: Active Interventions: Assess patient/caregiver ability to obtain necessary supplies Assess patient/caregiver ability to perform ulcer/skin care regimen upon admission and as needed Assess ulceration(s) every visit Provide education on ulcer and skin care Treatment Activities: Skin care regimen initiated : 01/08/2022 Topical wound management initiated : 01/08/2022 Notes: Electronic Signature(s) Signed: 02/25/2022 6:21:16 PM By: Baruch Gouty RN, BSN Entered By: Baruch Gouty on 02/25/2022 13:44:42 -------------------------------------------------------------------------------- Negative Pressure Wound Therapy Maintenance (NPWT) Details Patient Name: Date of ServiceGilman Schmidt 02/25/2022 1:15 PM Medical Record Number: [id number] Patient Account Number: [id number] Date of Birth/Sex: Treating RN: [birth year] (56 y.o. [name] Primary Care Nita Whitmire:  [name] Other Clinician: Referring Lorelie Biermann: Treating Evan Mackie/Extender: [name] in Treatment: 6 NPWT Maintenance Performed for: Wound #1 Sacrum Performed By: Baruch Gouty, RN Type: VAC System Coverage Size (sq cm): 5.76 Pressure Type: Constant Pressure Setting: 125 mmHG Drain Type:  None Sponge/Dressing Type: Foam, Black Date Initiated: 01/09/2022 Dressing Removed: No Quantity of Sponges/Gauze Removed: 1 Canister Changed: No Dressing Reapplied: No Quantity of Sponges/Gauze Inserted: 1 Respones T Treatment: o good Days On NPWT : 48 Post Procedure Diagnosis Same as Pre-procedure Electronic Signature(s) Signed: 02/25/2022 6:21:16 PM By: Baruch Gouty RN, BSN Entered By: Baruch Gouty on 02/25/2022 13:54:25 -------------------------------------------------------------------------------- Pain Assessment Details Patient Name: Date of Service: [name], [name] 02/25/2022 1:15 PM Medical Record Number: [id number] Patient Account Number: [id number] Date of Birth/Sex: Treating RN: 29-May-1966 (56 y.o. [name] Primary Care Talia Hoheisel: [name] Other Clinician: Referring Dianara Smullen: Treating Dreyson Mishkin/Extender: [name] in Treatment: 6 Active Problems Location of Pain Severity and Description of Pain Patient Has Paino Yes Site Locations Pain Location: Generalized Pain, Pain in Ulcers With Dressing Change: Yes Duration of the Pain. Constant / Intermittento Constant Rate the pain. Current Pain Level: 5 Worst Pain Level: 7 Character of Pain Describe the Pain: Aching Pain Management and Medication Current Pain Management: Medication: Yes Other: reposition Is the Current Pain Management Adequate: Adequate How does your wound impact your activities of daily livingo Sleep: No Bathing: No Appetite: No Relationship With Others: No Bladder Continence: No Emotions: Yes Bowel Continence: No Hobbies: No Toileting: No Dressing: No Electronic Signature(s) Signed: 02/25/2022 6:21:16 PM By: Baruch Gouty RN, BSN Entered By: Baruch Gouty on 02/25/2022 13:34:02 -------------------------------------------------------------------------------- Patient/Caregiver Education Details Patient Name: Date of  Service: [name], [name] 6/21/2023andnbsp1:15 PM Medical Record Number: [id number] Patient Account Number: [id number] Date of Birth/Gender: Treating RN: October 06, 1965 (56 y.o. [name] Primary Care Physician: [name] Other Clinician: Referring Physician: Treating Physician/Extender: [name] in Treatment: 6 Education Assessment Education Provided To: Patient Education Topics Provided Elevated Blood Sugar/ Impact on Healing: Methods: Explain/Verbal Responses: Reinforcements needed, State content correctly Pressure: Methods: Explain/Verbal Responses: Reinforcements needed, State content correctly Wound/Skin Impairment: Methods: Explain/Verbal Responses: Reinforcements needed, State content correctly Electronic Signature(s) Signed: 02/25/2022 6:21:16 PM By: Baruch Gouty RN, BSN Entered By: Baruch Gouty on 02/25/2022 13:45:29 -------------------------------------------------------------------------------- Wound Assessment Details Patient Name: Date of Service: [name], RO [place] 02/25/2022 1:15 PM Medical Record Number: [id number] Patient Account Number: [id number] Date of Birth/Sex: Treating RN: [birth year] (56 y.o. [name] Primary Care Lawrance Wiedemann: [name] Other Clinician: Referring Sativa Gelles: Treating Creola Krotz/Extender: [name] in Treatment: 6 Wound Status Wound Number: 1 Primary Pressure Ulcer Etiology: Wound Location: Sacrum Wound Open Wounding Event: Pressure Injury Status: Date Acquired: 07/29/2021 Comorbid Congestive Heart Failure, Hypertension, Type II Diabetes, Weeks Of Treatment: 6 History: Osteomyelitis, Confinement Anxiety Clustered Wound: No Photos Wound Measurements Length: (cm) 2.4 Width: (cm) 2.4 Depth: (cm) 1.3 Area: (cm) 4.524 Volume: (cm) 5.881 % Reduction in Area: 23.2% % Reduction in Volume: 9.2% Epithelialization: Small  (1-33%) Tunneling: No Undermining: Yes Starting Position (o'clock): 11 Ending Position (o'clock): 6 Maximum Distance: (cm) 1.7 Wound Description Classification: Category/Stage IV Wound Margin: Epibole Exudate Amount: Medium Exudate Type: Serous Exudate Color: amber Foul Odor After Cleansing: No Slough/Fibrino Yes Wound Bed Granulation Amount: Medium (34-66%) Exposed Structure Granulation Quality: Pink, Hyper-granulation Fascia Exposed: No Necrotic Amount: Medium (34-66%) Fat Layer (Subcutaneous Tissue) Exposed: Yes Necrotic Quality: Adherent Slough Tendon Exposed: No Muscle Exposed: No  Joint Exposed: No Bone Exposed: No Treatment Notes Wound #1 (Sacrum) Cleanser Peri-Wound Care Topical Gentamicin Discharge Instruction: apply thin layer to wound bed with dressing changes Primary Dressing VAC Discharge Instruction: be sure to place sponge in undermining and drape under the bridge Secondary Dressing Secured With Compression Wrap Compression Stockings Add-Ons Electronic Signature(s) Signed: 02/25/2022 6:21:16 PM By: Baruch Gouty RN, BSN Entered By: Baruch Gouty on 02/25/2022 13:42:54 -------------------------------------------------------------------------------- [place] Details Patient Name: Date of Service: [name], [name] 02/25/2022 1:15 PM Medical Record Number: [id number] Patient Account Number: [id number] Date of Birth/Sex: Treating RN: 08-Nov-1965 (56 y.o. [name] Primary Care Sadiq Mccauley: [name] Other Clinician: Referring Persais Ethridge: Treating Sanvika Cuttino/Extender: [name] in Treatment: 6 Vital Signs Time Taken: 13:32 Temperature (F): 97.6 Pulse (bpm): 74 Respiratory Rate (breaths/min): 18 Blood Pressure (mmHg): 145/77 Capillary Blood Glucose (mg/dl): 175 Reference Range: 80 - 120 mg / dl Notes glucose per pt report this am Electronic Signature(s) Signed: 02/25/2022 6:21:16 PM By: Baruch Gouty RN, BSN Entered By: Baruch Gouty on 02/25/2022 13:33:04

## 2022-03-02 DIAGNOSIS — M5136 Other intervertebral disc degeneration, lumbar region: Secondary | ICD-10-CM | POA: Diagnosis not present

## 2022-03-04 ENCOUNTER — Encounter (HOSPITAL_BASED_OUTPATIENT_CLINIC_OR_DEPARTMENT_OTHER): Payer: Medicare HMO | Admitting: General Surgery

## 2022-03-04 DIAGNOSIS — E119 Type 2 diabetes mellitus without complications: Secondary | ICD-10-CM | POA: Diagnosis not present

## 2022-03-04 DIAGNOSIS — L89154 Pressure ulcer of sacral region, stage 4: Secondary | ICD-10-CM | POA: Diagnosis not present

## 2022-03-04 DIAGNOSIS — Z89512 Acquired absence of left leg below knee: Secondary | ICD-10-CM | POA: Diagnosis not present

## 2022-03-04 DIAGNOSIS — I110 Hypertensive heart disease with heart failure: Secondary | ICD-10-CM | POA: Diagnosis not present

## 2022-03-04 DIAGNOSIS — I509 Heart failure, unspecified: Secondary | ICD-10-CM | POA: Diagnosis not present

## 2022-03-04 DIAGNOSIS — L98492 Non-pressure chronic ulcer of skin of other sites with fat layer exposed: Secondary | ICD-10-CM | POA: Diagnosis not present

## 2022-03-04 DIAGNOSIS — Z89511 Acquired absence of right leg below knee: Secondary | ICD-10-CM | POA: Diagnosis not present

## 2022-03-04 DIAGNOSIS — G8929 Other chronic pain: Secondary | ICD-10-CM | POA: Diagnosis not present

## 2022-03-16 ENCOUNTER — Other Ambulatory Visit: Payer: Self-pay

## 2022-03-16 ENCOUNTER — Telehealth: Payer: Self-pay | Admitting: Family

## 2022-03-16 DIAGNOSIS — E291 Testicular hypofunction: Secondary | ICD-10-CM

## 2022-03-16 DIAGNOSIS — R7989 Other specified abnormal findings of blood chemistry: Secondary | ICD-10-CM

## 2022-03-16 NOTE — Telephone Encounter (Signed)
..   Encourage patient to contact the pharmacy for refills or they can request refills through [facility]:  Please schedule appointment if longer than 1 year 01/21/22  NEXT APPOINTMENT DATE: Patient will call back to schedule appointment once completing PT  MEDICATION:furosemide (LASIX) 40 MG tablet  AND  testosterone cypionate (DEPOTESTOSTERONE CYPIONATE) 200 MG/ML injection  Is the patient out of medication? Almost out of both  PHARMACY: [facility] [place], [address] AT [address] Phone:  [phone]  Fax:  [phone]      Let patient know to contact pharmacy at the end of the day to make sure medication is ready.  Please notify patient to allow 48-72 hours to process

## 2022-03-16 NOTE — Telephone Encounter (Signed)
error 

## 2022-03-17 DIAGNOSIS — L89154 Pressure ulcer of sacral region, stage 4: Secondary | ICD-10-CM | POA: Diagnosis not present

## 2022-03-17 DIAGNOSIS — E291 Testicular hypofunction: Secondary | ICD-10-CM | POA: Diagnosis not present

## 2022-03-17 DIAGNOSIS — G4700 Insomnia, unspecified: Secondary | ICD-10-CM | POA: Diagnosis not present

## 2022-03-17 DIAGNOSIS — I5022 Chronic systolic (congestive) heart failure: Secondary | ICD-10-CM | POA: Diagnosis not present

## 2022-03-17 NOTE — Telephone Encounter (Signed)
Patient requests to be called at ph# [phone] to let Patient know when the two RX's below have been sent to the Pharmacy:  MEDICATION:furosemide (LASIX) 40 MG tablet   AND   testosterone cypionate (DEPOTESTOSTERONE CYPIONATE) 200 MG/ML injection   Is the patient out of medication? Almost out of both   PHARMACY: [facility] [place], [address] Phone:  [phone]  Fax:  [phone]

## 2022-03-17 NOTE — Telephone Encounter (Signed)
No refills without blood work. The Lasix requires monitoring his electrolytes & kidney fx & I have no testosterone level on him and he has had high blood pressure via the [facility] nurse checks and testosterone can make it go higher.   Can we ask [facility]? to draw blood at home? need a BMP & testosterone level prior to 9am in the morning, he does not have to be fasting.   Let me know, thanks.

## 2022-03-18 ENCOUNTER — Encounter (HOSPITAL_BASED_OUTPATIENT_CLINIC_OR_DEPARTMENT_OTHER): Payer: Medicare HMO | Attending: General Surgery | Admitting: General Surgery

## 2022-03-18 DIAGNOSIS — G8929 Other chronic pain: Secondary | ICD-10-CM | POA: Diagnosis not present

## 2022-03-18 DIAGNOSIS — Z89512 Acquired absence of left leg below knee: Secondary | ICD-10-CM | POA: Diagnosis not present

## 2022-03-18 DIAGNOSIS — E119 Type 2 diabetes mellitus without complications: Secondary | ICD-10-CM | POA: Insufficient documentation

## 2022-03-18 DIAGNOSIS — L89154 Pressure ulcer of sacral region, stage 4: Secondary | ICD-10-CM | POA: Insufficient documentation

## 2022-03-18 DIAGNOSIS — Z89511 Acquired absence of right leg below knee: Secondary | ICD-10-CM | POA: Diagnosis not present

## 2022-03-18 DIAGNOSIS — I509 Heart failure, unspecified: Secondary | ICD-10-CM | POA: Diagnosis not present

## 2022-03-18 DIAGNOSIS — I110 Hypertensive heart disease with heart failure: Secondary | ICD-10-CM | POA: Insufficient documentation

## 2022-03-18 DIAGNOSIS — L98492 Non-pressure chronic ulcer of skin of other sites with fat layer exposed: Secondary | ICD-10-CM | POA: Diagnosis not present

## 2022-03-18 NOTE — Progress Notes (Signed)
[name], [name] ([id number]) Visit Report for 03/18/2022 Arrival Information Details Patient Name: Date of Service: [name], [name] 03/18/2022 1:15 PM Medical Record Number: [id number] Patient Account Number: [id number] Date of Birth/Sex: Treating RN: 01/09/1966 (56 y.o. [name] Primary Care [name]: [name] Other Clinician: Referring [name]: Treating [name]/Extender: [name] in Treatment: 9 Visit Information History Since Last Visit Added or deleted any medications: No Patient Arrived: Wheel Chair Any new allergies or adverse reactions: No Arrival Time: 13:28 Had a fall or experienced change in No Accompanied By: friend activities of daily living that may affect Transfer Assistance: None risk of falls: Patient Identification Verified: Yes Signs or symptoms of abuse/neglect since last visito No Secondary Verification Process Completed: Yes Hospitalized since last visit: No Patient Requires Transmission-Based Precautions: No Implantable device outside of the clinic excluding No Patient Has Alerts: No cellular tissue based products placed in the center since last visit: Has Dressing in Place as Prescribed: Yes Pain Present Now: Yes Electronic Signature(s) Signed: 03/18/2022 6:19:16 PM By: Baruch Gouty RN, BSN Entered By: Baruch Gouty on 03/18/2022 13:40:00 -------------------------------------------------------------------------------- Encounter Discharge Information Details Patient Name: Date of Service: [name], RO [name] 03/18/2022 1:15 PM Medical Record Number: [id number] Patient Account Number: [id number] Date of Birth/Sex: Treating RN: [birth year] (56 y.o. [name] Primary Care [name]: [name] Other Clinician: Referring [name]: Treating [name]/Extender: [name] in Treatment: 9 Encounter Discharge Information Items Post Procedure Vitals Discharge  Condition: Stable Temperature (F): 98.2 Ambulatory Status: Wheelchair Pulse (bpm): 81 Discharge Destination: Home Respiratory Rate (breaths/min): 18 Transportation: Private Auto Blood Pressure (mmHg): 127/81 Accompanied By: friend Schedule Follow-up Appointment: Yes Clinical Summary of Care: Patient Declined Electronic Signature(s) Signed: 03/18/2022 6:19:16 PM By: Baruch Gouty RN, BSN Entered By: Baruch Gouty on 03/18/2022 17:31:36 -------------------------------------------------------------------------------- Lower Extremity Assessment Details Patient Name: Date of Service: [name], RO [name] 03/18/2022 1:15 PM Medical Record Number: [id number] Patient Account Number: [id number] Date of Birth/Sex: Treating RN: 1966-08-25 (56 y.o. [name] Primary Care Reniyah Gootee: [name] Other Clinician: Referring [name]: Treating [name]/Extender: [name] in Treatment: 9 Electronic Signature(s) Signed: 03/18/2022 6:19:16 PM By: Baruch Gouty RN, BSN Entered By: Baruch Gouty on 03/18/2022 13:41:23 -------------------------------------------------------------------------------- Multi Wound Chart Details Patient Name: Date of Service: [name], RO [name] 03/18/2022 1:15 PM Medical Record Number: [id number] Patient Account Number: [id number] Date of Birth/Sex: Treating RN: 15-Dec-1965 (56 y.o. [name] Primary Care [name]: [name] Other Clinician: Referring [name]: Treating [name]/Extender: [name] in Treatment: 9 Vital Signs Height(in): Capillary Blood Glucose(mg/dl): 190 Weight(lbs): Pulse(bpm): 81 Body Mass Index(BMI): Blood Pressure(mmHg): 127/81 Temperature(F): 98.2 Respiratory Rate(breaths/min): 18 Photos: [N/A:N/A] Sacrum N/A N/A Wound Location: Pressure Injury N/A N/A Wounding Event: Pressure Ulcer N/A N/A Primary Etiology: Congestive Heart  Failure, N/A N/A Comorbid History: Hypertension, Type II Diabetes, Osteomyelitis, Confinement Anxiety 07/29/2021 N/A N/A Date Acquired: 9 N/A N/A Weeks of Treatment: Open N/A N/A Wound Status: No N/A N/A Wound Recurrence: 2x1.7x1.3 N/A N/A Measurements L x W x D (cm) 2.67 N/A N/A A (cm) : rea 3.471 N/A N/A Volume (cm) : 54.70% N/A N/A % Reduction in A rea: 46.40% N/A N/A % Reduction in Volume: 12 Starting Position 1 (o'clock): 5 Ending Position 1 (o'clock): 1.6 Maximum Distance 1 (cm): Yes N/A N/A Undermining: Category/Stage IV N/A N/A Classification: Medium N/A N/A Exudate A mount: Serous N/A N/A Exudate Type: amber N/A N/A Exudate Color: Epibole N/A N/A Wound Margin: Large (67-100%) N/A  N/A Granulation Amount: Red, Pink N/A N/A Granulation Quality: Small (1-33%) N/A N/A Necrotic Amount: Fat Layer (Subcutaneous Tissue): Yes N/A N/A Exposed Structures: Fascia: No Tendon: No Muscle: No Joint: No Bone: No Small (1-33%) N/A N/A Epithelialization: Debridement - Selective/Open Wound N/A N/A Debridement: Pre-procedure Verification/Time Out 13:55 N/A N/A Taken: Lidocaine 4% T opical Solution N/A N/A Pain Control: Callus N/A N/A Tissue Debrided: Skin/Epidermis N/A N/A Level: 3.4 N/A N/A Debridement A (sq cm): rea Curette N/A N/A Instrument: Minimum N/A N/A Bleeding: Silver Nitrate N/A N/A Hemostasis A chieved: 0 N/A N/A Procedural Pain: 3 N/A N/A Post Procedural Pain: Procedure was tolerated well N/A N/A Debridement Treatment Response: 2x1.7x1.3 N/A N/A Post Debridement Measurements L x W x D (cm) 3.471 N/A N/A Post Debridement Volume: (cm) Category/Stage IV N/A N/A Post Debridement Stage: Debridement N/A N/A Procedures Performed: Negative Pressure Wound Therapy Maintenance (NPWT) Treatment Notes Electronic Signature(s) Signed: 03/18/2022 2:11:19 PM By: [name] Maudlin MD FACS Signed: 03/18/2022 6:19:16 PM By: Baruch Gouty  RN, BSN Entered By: [name] on 03/18/2022 14:11:18 -------------------------------------------------------------------------------- Multi-Disciplinary Care Plan Details Patient Name: Date of Service: [name], RO [name] 03/18/2022 1:15 PM Medical Record Number: [id number] Patient Account Number: [id number] Date of Birth/Sex: Treating RN: 1965/12/08 (56 y.o. [name] Primary Care Macil Crady: [name] Other Clinician: Referring Barnabas Henriques: Treating Shadeed Colberg/Extender: [name] in Treatment: 9 Multidisciplinary Care Plan reviewed with physician Active Inactive Nutrition Nursing Diagnoses: Impaired glucose control: actual or potential Potential for alteratiion in Nutrition/Potential for imbalanced nutrition Goals: Patient/caregiver will maintain therapeutic glucose control Date Initiated: 01/08/2022 Target Resolution Date: 04/02/2022 Goal Status: Active Interventions: Assess HgA1c results as ordered upon admission and as needed Assess patient nutrition upon admission and as needed per policy Provide education on elevated blood sugars and impact on wound healing Treatment Activities: Dietary management education, guidance and counseling : 01/08/2022 Patient referred to Primary Care Physician for further nutritional evaluation : 01/08/2022 Notes: Pressure Nursing Diagnoses: Knowledge deficit related to causes and risk factors for pressure ulcer development Knowledge deficit related to management of pressures ulcers Potential for impaired tissue integrity related to pressure, friction, moisture, and shear Goals: Patient/caregiver will verbalize understanding of pressure ulcer management Date Initiated: 01/08/2022 Target Resolution Date: 04/02/2022 Goal Status: Active Interventions: Assess: immobility, friction, shearing, incontinence upon admission and as needed Assess offloading mechanisms upon admission and as needed Assess  potential for pressure ulcer upon admission and as needed Notes: Wound/Skin Impairment Nursing Diagnoses: Impaired tissue integrity Knowledge deficit related to ulceration/compromised skin integrity Goals: Patient/caregiver will verbalize understanding of skin care regimen Date Initiated: 01/08/2022 Target Resolution Date: 04/02/2022 Goal Status: Active Ulcer/skin breakdown will have a volume reduction of 30% by week 4 Date Initiated: 01/08/2022 Date Inactivated: 02/18/2022 Target Resolution Date: 02/05/2022 Goal Status: Unmet Unmet Reason: VAC leaking Ulcer/skin breakdown will have a volume reduction of 50% by week 8 Date Initiated: 02/18/2022 Date Inactivated: 03/04/2022 Target Resolution Date: 03/05/2022 Goal Status: Unmet Unmet Reason: infection Ulcer/skin breakdown will have a volume reduction of 80% by week 12 Date Initiated: 03/04/2022 Target Resolution Date: 04/02/2022 Goal Status: Active Interventions: Assess patient/caregiver ability to obtain necessary supplies Assess patient/caregiver ability to perform ulcer/skin care regimen upon admission and as needed Assess ulceration(s) every visit Provide education on ulcer and skin care Treatment Activities: Skin care regimen initiated : 01/08/2022 Topical wound management initiated : 01/08/2022 Notes: Electronic Signature(s) Signed: 03/18/2022 6:19:16 PM By: Baruch Gouty RN, BSN Entered By: Baruch Gouty on 03/18/2022 13:52:25 -------------------------------------------------------------------------------- Negative Pressure Wound Therapy  Maintenance (NPWT) Details Patient Name: Date of ServiceGilman Schmidt 03/18/2022 1:15 PM Medical Record Number: [id number] Patient Account Number: [id number] Date of Birth/Sex: Treating RN: [birth year] (56 y.o. [name] Primary Care Rashaan Wyles: [name] Other Clinician: Referring Melissaann Dizdarevic: Treating Betrice Wanat/Extender: [name] in  Treatment: 9 NPWT Maintenance Performed for: Wound #1 Sacrum Performed By: Baruch Gouty, RN Type: VAC System Coverage Size (sq cm): 3.4 Pressure Type: Constant Pressure Setting: 125 mmHG Drain Type: None Primary Contact: Other : keystone antibiotic compound Sponge/Dressing Type: Foam, Black Date Initiated: 01/09/2022 Dressing Removed: No Quantity of Sponges/Gauze Removed: 1 Canister Changed: No Canister Exudate Volume: 20 Dressing Reapplied: No Quantity of Sponges/Gauze Inserted: 1 Respones T Treatment: o good Days On NPWT : 69 Post Procedure Diagnosis Same as Pre-procedure Electronic Signature(s) Signed: 03/18/2022 6:19:16 PM By: Baruch Gouty RN, BSN Entered By: Baruch Gouty on 03/18/2022 14:02:31 -------------------------------------------------------------------------------- Pain Assessment Details Patient Name: Date of Service: [name], RO [place] 03/18/2022 1:15 PM Medical Record Number: [id number] Patient Account Number: [id number] Date of Birth/Sex: Treating RN: 06-12-1966 (56 y.o. [name] Primary Care Saya Mccoll: [name] Other Clinician: Referring Abubakar Crispo: Treating Nicolina Hirt/Extender: [name] in Treatment: 9 Active Problems Location of Pain Severity and Description of Pain Patient Has Paino Yes Site Locations Pain Location: Pain in Ulcers With Dressing Change: Yes Duration of the Pain. Constant / Intermittento Constant Rate the pain. Current Pain Level: 5 Worst Pain Level: 7 Least Pain Level: 3 Character of Pain Describe the Pain: Aching Pain Management and Medication Current Pain Management: Medication: Yes Other: reposition Is the Current Pain Management Adequate: Adequate How does your wound impact your activities of daily livingo Sleep: No Bathing: No Appetite: No Relationship With Others: No Bladder Continence: No Emotions: No Bowel Continence: No Work: No Toileting: No Drive:  No Dressing: No Hobbies: No Electronic Signature(s) Signed: 03/18/2022 6:19:16 PM By: Baruch Gouty RN, BSN Entered By: Baruch Gouty on 03/18/2022 13:41:18 -------------------------------------------------------------------------------- Patient/Caregiver Education Details Patient Name: Date of Service: [name], RO [name] 7/12/2023andnbsp1:15 PM Medical Record Number: [id number] Patient Account Number: [id number] Date of Birth/Gender: Treating RN: [birth year] (56 y.o. [name] Primary Care Physician: [name] Other Clinician: Referring Physician: Treating Physician/Extender: [name] in Treatment: 9 Education Assessment Education Provided To: Patient Education Topics Provided Pressure: Methods: Explain/Verbal Responses: Reinforcements needed, State content correctly Wound/Skin Impairment: Methods: Explain/Verbal Responses: Reinforcements needed, State content correctly Electronic Signature(s) Signed: 03/18/2022 6:19:16 PM By: Baruch Gouty RN, BSN Entered By: Baruch Gouty on 03/18/2022 13:52:48 -------------------------------------------------------------------------------- Wound Assessment Details Patient Name: Date of Service: [name], RO [place] 03/18/2022 1:15 PM Medical Record Number: [id number] Patient Account Number: [id number] Date of Birth/Sex: Treating RN: Aug 15, 1966 (56 y.o. [name] Primary Care Yaritzy Huser: [name] Other Clinician: Referring Karsten Vaughn: Treating Laban Orourke/Extender: [name] in Treatment: 9 Wound Status Wound Number: 1 Primary Pressure Ulcer Etiology: Wound Location: Sacrum Wound Open Wounding Event: Pressure Injury Status: Date Acquired: 07/29/2021 Comorbid Congestive Heart Failure, Hypertension, Type II Diabetes, Weeks Of Treatment: 9 History: Osteomyelitis, Confinement Anxiety History: Osteomyelitis, Confinement  Anxiety Clustered Wound: No Photos Wound Measurements Length: (cm) 2 Width: (cm) 1.7 Depth: (cm) 1.3 Area: (cm) 2.67 Volume: (cm) 3.471 % Reduction in Area: 54.7% % Reduction in Volume: 46.4% Epithelialization: Small (1-33%) Undermining: Yes Starting Position (o'clock): 12 Ending Position (o'clock): 5 Maximum Distance: (cm) 1.6 Wound Description Classification: Category/Stage IV Wound Margin: Epibole Exudate Amount: Medium Exudate Type: Serous Exudate Color: amber  Foul Odor After Cleansing: No Slough/Fibrino Yes Wound Bed Granulation Amount: Large (67-100%) Exposed Structure Granulation Quality: Red, Pink Fascia Exposed: No Necrotic Amount: Small (1-33%) Fat Layer (Subcutaneous Tissue) Exposed: Yes Necrotic Quality: Adherent Slough Tendon Exposed: No Muscle Exposed: No Joint Exposed: No Bone Exposed: No Treatment Notes Wound #1 (Sacrum) Cleanser Peri-Wound Care Topical Keystone antibiotic compound Discharge Instruction: apply to base of wound under the [facility] with dressing changes Primary Dressing VAC Discharge Instruction: be sure to place sponge in undermining and drape under the bridge Secondary Dressing Secured With Compression Wrap Compression Stockings Add-Ons Electronic Signature(s) Signed: 03/18/2022 6:19:16 PM By: Baruch Gouty RN, BSN Entered By: Baruch Gouty on 03/18/2022 13:56:11 -------------------------------------------------------------------------------- Vitals Details Patient Name: Date of Service: [name], RO [place] 03/18/2022 1:15 PM Medical Record Number: [id number] Patient Account Number: [id number] Date of Birth/Sex: Treating RN: 04/05/1966 (56 y.o. [name] Primary Care Jeanenne Licea: [name] Other Clinician: Referring Matty Vanroekel: Treating Achille Xiang/Extender: [name] in Treatment: 9 Vital Signs Time Taken: 13:40 Temperature (F): 98.2 Pulse (bpm): 81 Respiratory Rate  (breaths/min): 18 Blood Pressure (mmHg): 127/81 Capillary Blood Glucose (mg/dl): 190 Reference Range: 80 - 120 mg / dl Notes glucose per pt report this am Electronic Signature(s) Signed: 03/18/2022 6:19:16 PM By: Baruch Gouty RN, BSN Entered By: Baruch Gouty on 03/18/2022 13:40:38

## 2022-03-18 NOTE — Telephone Encounter (Signed)
I called and spoke with Dayton Martes [facility] nurse with [facility]. Hinton Dyer stated she will be seeing him Friday and get labs done. Orders also have been placed for future.

## 2022-03-19 NOTE — Progress Notes (Signed)
[name], [name] ([id number]) Visit Report for 03/18/2022 Chief Complaint Document Details Patient Name: Date of Service: [name], [name] 03/18/2022 1:15 PM Medical Record Number: [id number] Patient Account Number: [id number] Date of Birth/Sex: Treating RN: 1966/05/21 (56 y.o. [name] Primary Care Provider: Jeanie [name] Other Clinician: Referring Provider: Treating Provider/Extender: [name] in Treatment: 9 Information Obtained from: Patient Chief Complaint Patient is at the clinic for treatment of an open pressure ulcer Electronic Signature(s) Signed: 03/18/2022 2:11:25 PM By: [name] Maudlin MD FACS Entered By: [name] on 03/18/2022 14:11:24 -------------------------------------------------------------------------------- Debridement Details Patient Name: Date of Service: [name], [name] 03/18/2022 1:15 PM Medical Record Number: [id number] Patient Account Number: [id number] Date of Birth/Sex: Treating RN: August 25, 1966 (56 y.o. [name] Primary Care Provider: Jeanie [name] Other Clinician: Referring Provider: Treating Provider/Extender: [name] in Treatment: 9 Debridement Performed for Assessment: Wound #1 Sacrum Performed By: Physician [name] Maudlin, MD Debridement Type: Debridement Level of Consciousness (Pre-procedure): Awake and Alert Pre-procedure Verification/Time Out Yes - 13:55 Taken: Start Time: 13:58 Pain Control: Lidocaine 4% T opical Solution T Area Debrided (L x W): otal 2 (cm) x 1.7 (cm) = 3.4 (cm) Tissue and other material debrided: Non-Viable, Callus, Skin: Epidermis Level: Skin/Epidermis Debridement Description: Selective/Open Wound Instrument: Curette Bleeding: Minimum Hemostasis Achieved: Silver Nitrate Procedural Pain: 0 Post Procedural Pain: 3 Response to Treatment: Procedure was tolerated well Level of Consciousness (Post- Awake and  Alert procedure): Post Debridement Measurements of Total Wound Length: (cm) 2 Stage: Category/Stage IV Width: (cm) 1.7 Depth: (cm) 1.3 Volume: (cm) 3.471 Character of Wound/Ulcer Post Debridement: Improved Post Procedure Diagnosis Same as Pre-procedure Electronic Signature(s) Signed: 03/18/2022 2:51:56 PM By: [name] Maudlin MD FACS Signed: 03/18/2022 6:19:16 PM By: [name] Gouty RN, BSN Entered By: [name] on 03/18/2022 14:01:37 -------------------------------------------------------------------------------- HPI Details Patient Name: Date of Service: [name], [name] 03/18/2022 1:15 PM Medical Record Number: [id number] Patient Account Number: [id number] Date of Birth/Sex: Treating RN: 1966/03/15 (56 y.o. [name] Primary Care Provider: Jeanie [name] Other Clinician: Referring Provider: Treating Provider/Extender: [name] in Treatment: 9 History of Present Illness HPI Description: ADMISSION 01/08/2022 This is a 56 year old male with a past medical history notable for type 2 diabetes mellitus (last A1c was 8.6) congestive heart failure, hypertension, chronic pain, and bilateral below-knee amputations. His most recent amputation was in November 2022. While he was in the hospital, he developed a sacral pressure ulcer. He was subsequently in a skilled nursing facility for some time. He was discharged with home health and had been in a wound VAC, but was then admitted to the hospital last week when the wound appeared to be worsening. Apparently the periwound skin was macerated and the device that he had been using was leaking quite a bit. Evaluation while in the hospital included a consultation with infectious disease who did not think he had any evidence of osteomyelitis, plastic surgery who felt that he was not an appropriate flap candidate (their note also states that he is not interested in a flap) and wound care who took him  out of the wound VAC and initiated wet-to-dry dressing changes. He has a new wound VAC from [facility] on order, anticipated delivery today. The wound itself is fairly small and isolated to the sacrum. There is muscle exposed. No bone is appreciated but it is palpable beneath the surface. The muscle itself is bit pale and there is heaped up epibole around the wound edges. No significant  odor or drainage. 01/14/2022: His wound VAC was not initiated until this past Friday. He has not had any issues with the VAC but today we found that the bridge foam was applied directly to the skin rather than over a layer of adhesive drape. His home health nurse also requested that we consider applying silver collagen to the wound bed in addition to the VAC. There is still a little bit of heaped up senescent skin around the wound periphery. No significant change to the wound dimensions. 01/21/2022: No significant change to the wound dimensions. The senescent skin has not reaccumulated. The periwound skin remains a bit macerated but without any obvious breakdown. The wound surface itself has a shiny appearance with a little bit of slough accumulation; no true robust granulation tissue at this time. 01/28/2022: The wound is about the same size but a little bit shallower. There is a little bit of senescent skin reaccumulation at the cranial aspect. The periwound skin is red but not macerated and without any tissue breakdown. The wound still does not have the most robust surface. There is a bit of slough accumulation. 02/04/2022: The wound is a bit smaller and the undermining has come in somewhat. There continues to be granulation tissue formation within the wound bed. No significant slough accumulation and his periwound skin is in better condition. 02/18/2022: The wound stinks today. There is no obvious pus but the drainage and wound itself are malodorous. He continues to have heaped up tissue within the wound bed that is rather  grayish and not particularly robust-appearing. He is very angry today with his situation. 02/25/2022: Last week, in response to the odor coming from the wound, I took a culture and prescribed topical gentamicin as well as oral cefdinir. Apparently [facility] contacted him about a topical compounded antibiotic, but he did not realize this and he hung up on them. Today, the odor is no longer present. There is some senescent skin around the wound margins as well as continued heaped up granulation tissue in the center of the wound. The undermining continues to contract. 03/04/2022: The wound continues to contract and look better. He still has heaped up hypertrophic granulation tissue near the orifice. No odor was coming from the wound today. He is awaiting [facility] delivery. 03/18/2022: 2-week follow-up. [facility] topical antibiotic has been initiated. The chemical cauterization of the hypertrophic granulation tissue was quite successful and the surface is much flatter today. He has heaped up senescent skin around the perimeter. His home health providers have figured out a way to keep the wound VAC from losing suction by bolstering with DuoDERM. Overall there has been significant improvement since our last visit. Electronic Signature(s) Signed: 03/18/2022 2:12:38 PM By: [name] Maudlin MD FACS Entered By: [name] on 03/18/2022 14:12:38 -------------------------------------------------------------------------------- Physical Exam Details Patient Name: Date of Service: [name], [name] 03/18/2022 1:15 PM Medical Record Number: [id number] Patient Account Number: [id number] Date of Birth/Sex: Treating RN: October 17, 1965 (56 y.o. [name] Primary Care Provider: Jeanie [name] Other Clinician: Referring Provider: Treating Provider/Extender: [name] in Treatment: 9 Constitutional . . . . No acute distress.Marland Kitchen Respiratory Normal work of breathing on room  air.. Notes 03/18/2022: The chemical cauterization of the hypertrophic granulation tissue was quite successful and the surface is much flatter today. He has heaped up senescent skin around the perimeter. Overall there has been significant improvement since our last visit. Electronic Signature(s) Signed: 03/18/2022 2:13:29 PM By: [name] Maudlin MD FACS Entered By: [name]  on 03/18/2022 14:13:29 -------------------------------------------------------------------------------- Physician Orders Details Patient Name: Date of Service: [place], [name] 03/18/2022 1:15 PM Medical Record Number: [id number] Patient Account Number: [id number] Date of Birth/Sex: Treating RN: 02/01/1966 (56 y.o. [name] Primary Care Provider: Jeanie [name] Other Clinician: Referring Provider: Treating Provider/Extender: [name] in Treatment: 9 Verbal / Phone Orders: No Diagnosis Coding ICD-10 Coding Code Description L89.154 Pressure ulcer of sacral region, stage 4 Z89.512 Acquired absence of left leg below knee Z89.511 Acquired absence of right leg below knee Follow-up Appointments ppointment in 2 weeks. - Dr. Celine Ahr RM1 with [facility] Return A Wed. 7/26 @ 1:15 pm Bathing/ Shower/ Hygiene May shower with protection but do not get wound dressing(s) wet. Negative Presssure Wound Therapy Wound #1 Sacrum Wound Vac to wound continuously at 126m/hg pressure Black Foam Off-Loading Gel mattress overlay (Group 1) Turn and reposition every 2 hours Home Health Dressing changes to be completed by [facility] Monday / Wednesday / Friday [facility] Be sure no sponge directly on the skin, place drape under the bridge Other Home Health Orders/Instructions: - [facility] Wound Treatment Wound #1 - Sacrum Topical: [facility] antibiotic compound 3 x Per Week/30 Days Discharge Instructions: apply to base of wound  under the VAC with dressing changes Prim Dressing: VAC 3 x Per Week/30 Days ary Discharge Instructions: be sure to place sponge in undermining and drape under the bridge Electronic Signature(s) Signed: 03/18/2022 2:51:56 PM By: CFredirick MaudlinMD FACS Entered By: CFredirick Maudlinon 03/18/2022 14:13:39 -------------------------------------------------------------------------------- Problem List Details Patient Name: Date [place] 1:15 PM Medical Record Number: [id number] Account Number: [id number] of Birth/Sex: Treating RN: [birth year](56y.o. MErnestene MentionPrimary Care Provider: HJeanie SewerOther Clinician: Referring Provider: Treating Provider/Extender: CChristie Nottinghamin Treatment: 9 Active Problems ICD-10 Encounter Code Description Active Date MDM Diagnosis L89.154 Pressure ulcer of sacral region, stage 4 01/08/2022 No Yes Z89.512 Acquired absence of left leg below knee 01/08/2022 No Yes Z89.511 Acquired absence of right leg below knee 01/08/2022 No Yes Inactive Problems Resolved Problems Electronic Signature(s) Signed: 03/18/2022 2:11:11 PM By: CFredirick MaudlinMD FACS Entered By: CFredirick Maudlinon 03/18/2022 14:11:10 -------------------------------------------------------------------------------- Progress Note Details Patient Name: Date of Service: HCicero Duck [name] 03/18/2022 1:15 PM Medical Record Number: [id number] Account Number: [id number] of Birth/Sex: Treating RN: [birth year](56y.o. MErnestene MentionPrimary Care Provider: HJeanie SewerOther Clinician: Referring Provider: Treating Provider/Extender: CChristie Nottinghamin Treatment: 9 Subjective Chief Complaint Information obtained from Patient Patient is at the clinic for treatment of an open pressure ulcer History of Present Illness (HPI) ADMISSION 01/08/2022 This is a 56year old  male with a past medical history notable for type 2 diabetes mellitus (last A1c was 8.6) congestive heart failure, hypertension, chronic pain, and bilateral below-knee amputations. His most recent amputation was in November 2022. While he was in the hospital, he developed a sacral pressure ulcer. He was subsequently in a skilled nursing facility for some time. He was discharged with home health and had been in a wound VAC, but was then admitted to the hospital last week when the wound appeared to be worsening. Apparently the periwound skin was macerated and the device that he had been using was leaking quite a bit. Evaluation while in the hospital included a consultation with infectious disease who did not think he had any evidence of osteomyelitis,  plastic surgery who felt that he was not an appropriate flap candidate (their note also states that he is not interested in a flap) and wound care who took him out of the wound VAC and initiated wet-to-dry dressing changes. He has a new wound VAC from [facility] on order, anticipated delivery today. The wound itself is fairly small and isolated to the sacrum. There is muscle exposed. No bone is appreciated but it is palpable beneath the surface. The muscle itself is bit pale and there is heaped up epibole around the wound edges. No significant odor or drainage. 01/14/2022: His wound VAC was not initiated until this past Friday. He has not had any issues with the VAC but today we found that the bridge foam was applied directly to the skin rather than over a layer of adhesive drape. His home health nurse also requested that we consider applying silver collagen to the wound bed in addition to the VAC. There is still a little bit of heaped up senescent skin around the wound periphery. No significant change to the wound dimensions. 01/21/2022: No significant change to the wound dimensions. The senescent skin has not reaccumulated. The periwound skin remains a bit  macerated but without any obvious breakdown. The wound surface itself has a shiny appearance with a little bit of slough accumulation; no true robust granulation tissue at this time. 01/28/2022: The wound is about the same size but a little bit shallower. There is a little bit of senescent skin reaccumulation at the cranial aspect. The periwound skin is red but not macerated and without any tissue breakdown. The wound still does not have the most robust surface. There is a bit of slough accumulation. 02/04/2022: The wound is a bit smaller and the undermining has come in somewhat. There continues to be granulation tissue formation within the wound bed. No significant slough accumulation and his periwound skin is in better condition. 02/18/2022: The wound stinks today. There is no obvious pus but the drainage and wound itself are malodorous. He continues to have heaped up tissue within the wound bed that is rather grayish and not particularly robust-appearing. He is very angry today with his situation. 02/25/2022: Last week, in response to the odor coming from the wound, I took a culture and prescribed topical gentamicin as well as oral cefdinir. Apparently [facility] contacted him about a topical compounded antibiotic, but he did not realize this and he hung up on them. Today, the odor is no longer present. There is some senescent skin around the wound margins as well as continued heaped up granulation tissue in the center of the wound. The undermining continues to contract. 03/04/2022: The wound continues to contract and look better. He still has heaped up hypertrophic granulation tissue near the orifice. No odor was coming from the wound today. He is awaiting [facility] delivery. 03/18/2022: 2-week follow-up. [facility] topical antibiotic has been initiated. The chemical cauterization of the hypertrophic granulation tissue was quite successful and the surface is much flatter today. He has heaped up senescent  skin around the perimeter. His home health providers have figured out a way to keep the wound VAC from losing suction by bolstering with DuoDERM. Overall there has been significant improvement since our last visit. Patient History Information obtained from Patient, Caregiver, Chart. Family History Cancer - Father, Heart Disease - Father,Paternal Grandparents, Hypertension - Father, No family history of Diabetes, Hereditary Spherocytosis, Kidney Disease, Lung Disease, Seizures, Stroke, Thyroid Problems, Tuberculosis. Social History Never smoker, Marital Status - Divorced,  Alcohol Use - Never, Drug Use - No History, Caffeine Use - Moderate - coffee. Medical History Eyes Denies history of Cataracts, Glaucoma, Optic Neuritis Ear/Nose/Mouth/Throat Denies history of Chronic sinus problems/congestion, Middle ear problems Cardiovascular Patient has history of Congestive Heart Failure, Hypertension Endocrine Patient has history of Type II Diabetes Denies history of Type I Diabetes Genitourinary Denies history of End Stage Renal Disease Integumentary (Skin) Denies history of History of Burn Musculoskeletal Patient has history of Osteomyelitis - S1 and coccyx Oncologic Denies history of Received Chemotherapy, Received Radiation Psychiatric Patient has history of Confinement Anxiety Denies history of Anorexia/bulimia Hospitalization/Surgery History - bil BKA. Medical A Surgical History Notes nd Respiratory pulmonary nodules Genitourinary renal mass, urinary hesitancy Musculoskeletal discitis of thoracic region Objective Constitutional No acute distress.. Vitals Time Taken: 1:40 PM, Temperature: 98.2 F, Pulse: 81 bpm, Respiratory Rate: 18 breaths/min, Blood Pressure: 127/81 mmHg, Capillary Blood Glucose: 190 mg/dl. General Notes: glucose per pt report this am Respiratory Normal work of breathing on room air.. General Notes: 03/18/2022: The chemical cauterization of the hypertrophic  granulation tissue was quite successful and the surface is much flatter today. He has heaped up senescent skin around the perimeter. Overall there has been significant improvement since our last visit. Integumentary (Hair, Skin) Wound #1 status is Open. Original cause of wound was Pressure Injury. The date acquired was: 07/29/2021. The wound has been in treatment 9 weeks. The wound is located on the Sacrum. The wound measures 2cm length x 1.7cm width x 1.3cm depth; 2.67cm^2 area and 3.471cm^3 volume. There is Fat Layer (Subcutaneous Tissue) exposed. There is undermining starting at 12:00 and ending at 5:00 with a maximum distance of 1.6cm. There is a medium amount of serous drainage noted. The wound margin is epibole. There is large (67-100%) red, pink granulation within the wound bed. There is a small (1-33%) amount of necrotic tissue within the wound bed including Adherent Slough. 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 There was a Selective/Open Wound Skin/Epidermis Debridement with a total area of 3.4 sq cm performed by [name] Maudlin, MD. With the following instrument(s): Curette to remove Non-Viable tissue/material. Material removed includes Callus and Skin: Epidermis and after achieving pain control using Lidocaine 4% T opical Solution. No specimens were taken. A time out was conducted at 13:55, prior to the start of the procedure. A Minimum amount of bleeding was controlled with Silver Nitrate. The procedure was tolerated well with a pain level of 0 throughout and a pain level of 3 following the procedure. Post Debridement Measurements: 2cm length x 1.7cm width x 1.3cm depth; 3.471cm^3 volume. Post debridement Stage noted as Category/Stage IV. Character of Wound/Ulcer Post  Debridement is improved. Post procedure Diagnosis Wound #1: Same as Pre-Procedure Plan Follow-up Appointments: Return Appointment in 2 weeks. - Dr. Celine Ahr RM1 with [facility]. 7/26 @ 1:15 pm Bathing/ Shower/ Hygiene: May shower with protection but do not get wound dressing(s) wet. Negative Presssure Wound Therapy: Wound #1 Sacrum: Wound Vac to wound continuously at 155m/hg pressure Black Foam Off-Loading: Gel mattress overlay (Group 1) Turn and reposition every 2 hours Home Health: Dressing [place] [facility] [place] / Wednesday / Friday except when [facility] directly on the skin, place  drape under the bridge Other Home Health Orders/Instructions: - [facility] WOUND #1: - Sacrum Wound Laterality: Topical: [facility] antibiotic compound 3 x Per Week/30 Days Discharge Instructions: apply to base of wound under the VAC with dressing changes Prim Dressing: VAC 3 x Per Week/30 Days ary Discharge Instructions: be sure to place sponge in undermining and drape under the bridge 03/18/2022: The chemical cauterization of the hypertrophic granulation tissue was quite successful and the surface is much flatter today. He has heaped up senescent skin around the perimeter. Overall there has been significant improvement since our last visit. I used a curette to debride the senescent skin from the wound periphery. I also chemically cauterized the hypertrophic granulation tissue in the wound bed using silver nitrate. We will continue using his [facility] topical antibiotic and the wound VAC. Follow-up in 2 weeks. Electronic Signature(s) Signed: 03/18/2022 2:14:22 PM By: [name] Maudlin MD FACS Entered By: [name] on 03/18/2022 14:14:22 -------------------------------------------------------------------------------- HxROS Details Patient Name: Date of Service: [name], [name] 03/18/2022 1:15 PM Medical Record Number:  [id number] Patient Account Number: [id number] Date of Birth/Sex: Treating RN: Mar 03, 1966 (56 y.o. [name] Primary Care Provider: Jeanie [name] Other Clinician: Referring Provider: Treating Provider/Extender: [name] in Treatment: 9 Information Obtained From Patient Caregiver Chart Eyes Medical History: Negative for: Cataracts; Glaucoma; Optic Neuritis Ear/Nose/Mouth/Throat Medical History: Negative for: Chronic sinus problems/congestion; Middle ear problems Respiratory Medical History: Past Medical History Notes: pulmonary nodules Cardiovascular Medical History: Positive for: Congestive Heart Failure; Hypertension Endocrine Medical History: Positive for: Type II Diabetes Negative for: Type I Diabetes Time with diabetes: since 2004 Treated with: Insulin Blood sugar tested every day: Yes Tested : 4 times per day Genitourinary Medical History: Negative for: End Stage Renal Disease Past Medical History Notes: renal mass, urinary hesitancy Integumentary (Skin) Medical History: Negative for: History of Burn Musculoskeletal Medical History: Positive for: Osteomyelitis - S1 and coccyx Past Medical History Notes: discitis of thoracic region Oncologic Medical History: Negative for: Received Chemotherapy; Received Radiation Psychiatric Medical History: Positive for: Confinement Anxiety Negative for: Anorexia/bulimia Immunizations Pneumococcal Vaccine: Received Pneumococcal Vaccination: No Implantable Devices No devices added Hospitalization / Surgery History Type of Hospitalization/Surgery bil BKA Family and Social History Cancer: Yes - Father; Diabetes: No; Heart Disease: Yes - Father,Paternal Grandparents; Hereditary Spherocytosis: No; Hypertension: Yes - Father; Kidney Disease: No; Lung Disease: No; Seizures: No; Stroke: No; Thyroid Problems: No; Tuberculosis: No; Never smoker; Marital Status - Divorced; Alcohol  Use: Never; Drug Use: No History; Caffeine Use: Moderate - coffee; Financial Concerns: No; Food, Clothing or Shelter Needs: No; Support System Lacking: No; Transportation Concerns: Yes - hurts to transfer Electronic Signature(s) Signed: 03/18/2022 2:51:56 PM By: [name] Maudlin MD FACS Signed: 03/18/2022 6:19:16 PM By: [name] Gouty RN, BSN Entered By: [name] on 03/18/2022 14:12:44 -------------------------------------------------------------------------------- SuperBill Details Patient Name: Date of Service: [name], [place] 03/18/2022 Medical Record Number: [id number] Patient Account Number: [id number] Date of Birth/Sex: Treating RN: 24-Aug-1966 (56 y.o. [name] Primary Care Provider: Jeanie [name] Other Clinician: Referring Provider: Treating Provider/Extender: [name] in Treatment: 9 Diagnosis Coding ICD-10 Codes Code Description L89.154 Pressure ulcer of sacral region, stage 4 Z89.512 Acquired absence of left leg below knee Z89.511 Acquired absence of right leg below knee Facility Procedures Physician Procedures : CPT4 Code Description Modifier [id number] - WC PHYS LEVEL 4 - EST PT 25 ICD-10 Diagnosis Description L89.154 Pressure ulcer of sacral region, stage 4 Z89.512 Acquired absence of left leg below knee  Z89.511 Acquired absence of right leg below knee Quantity: 1 : [id number] - WC PHYS DEBR WO ANESTH 20 SQ CM ICD-10 Diagnosis Description L89.154 Pressure ulcer of sacral region, stage 4 Quantity: 1 Electronic Signature(s) Signed: 03/18/2022 6:19:16 PM By: [name] Gouty RN, BSN Signed: 03/19/2022 7:33:58 AM By: [name] Maudlin MD FACS Previous Signature: 03/18/2022 2:14:42 PM Version By: [name] Maudlin MD FACS Entered By: [name] on 03/18/2022 17:29:28

## 2022-03-20 DIAGNOSIS — Z8701 Personal history of pneumonia (recurrent): Secondary | ICD-10-CM | POA: Diagnosis not present

## 2022-03-20 DIAGNOSIS — E1165 Type 2 diabetes mellitus with hyperglycemia: Secondary | ICD-10-CM | POA: Diagnosis not present

## 2022-03-20 DIAGNOSIS — E1122 Type 2 diabetes mellitus with diabetic chronic kidney disease: Secondary | ICD-10-CM | POA: Diagnosis not present

## 2022-03-20 DIAGNOSIS — E1142 Type 2 diabetes mellitus with diabetic polyneuropathy: Secondary | ICD-10-CM | POA: Diagnosis not present

## 2022-03-20 DIAGNOSIS — I130 Hypertensive heart and chronic kidney disease with heart failure and stage 1 through stage 4 chronic kidney disease, or unspecified chronic kidney disease: Secondary | ICD-10-CM | POA: Diagnosis not present

## 2022-03-20 DIAGNOSIS — N390 Urinary tract infection, site not specified: Secondary | ICD-10-CM | POA: Diagnosis not present

## 2022-03-20 DIAGNOSIS — Z87440 Personal history of urinary (tract) infections: Secondary | ICD-10-CM | POA: Diagnosis not present

## 2022-03-20 DIAGNOSIS — N1831 Chronic kidney disease, stage 3a: Secondary | ICD-10-CM | POA: Diagnosis not present

## 2022-03-20 LAB — BASIC METABOLIC PANEL
BUN: 32 — AB (ref 4–21)
Chloride: 93 — AB (ref 99–108)
Creatinine: 1 (ref 0.6–1.3)
Glucose: 452
Potassium: 4.6 mEq/L (ref 3.5–5.1)
Sodium: 131 — AB (ref 137–147)

## 2022-03-20 LAB — COMPREHENSIVE METABOLIC PANEL WITH GFR: Calcium: 9.8 (ref 8.7–10.7)

## 2022-03-23 ENCOUNTER — Telehealth: Payer: Self-pay | Admitting: Family

## 2022-03-23 NOTE — Telephone Encounter (Signed)
Labs received [name] said she will look over labs first. Pt is aware.

## 2022-03-23 NOTE — Telephone Encounter (Signed)
Patient requests to be called at ph# [phone] for status/results of Labs that were faxed to Provider fax# [phone] by Patient's [facility] Nurse today (03/23/22) at 9:30 am

## 2022-03-24 ENCOUNTER — Other Ambulatory Visit: Payer: Self-pay | Admitting: Family

## 2022-03-24 ENCOUNTER — Telehealth: Payer: Self-pay | Admitting: Cardiovascular Disease

## 2022-03-24 DIAGNOSIS — R7989 Other specified abnormal findings of blood chemistry: Secondary | ICD-10-CM

## 2022-03-24 DIAGNOSIS — E291 Testicular hypofunction: Secondary | ICD-10-CM

## 2022-03-24 DIAGNOSIS — T8789 Other complications of amputation stump: Secondary | ICD-10-CM

## 2022-03-24 MED ORDER — TESTOSTERONE CYPIONATE 200 MG/ML IM SOLN: 200.0000 mg | INTRAMUSCULAR | 0 refills | Status: DC

## 2022-03-24 MED ORDER — FUROSEMIDE 20 MG PO TABS: 20.0000 mg | ORAL_TABLET | Freq: Every day | ORAL | 0 refills | Status: DC

## 2022-03-24 NOTE — Telephone Encounter (Signed)
Pt is requesting a call back about results.   Pt states frustration that results have not been communicated sooner.

## 2022-03-24 NOTE — Telephone Encounter (Signed)
*  STAT* If patient is at the pharmacy, call can be transferred to refill team.   1. Which medications need to be refilled? (please list name of each medication and dose if known) furosemide (LASIX) 40 MG tablet  2. Which pharmacy/location (including street and city if local pharmacy) is medication to be sent to? [place], [place]  3. Do they need a 30 day or 90 day supply? 7 week  Pt is requesting a 7 week supply until his appt with [place], [place] 04/01/22. Pt states that he is completely out.

## 2022-03-24 NOTE — Telephone Encounter (Signed)
Let [name] know I refilled his testosterone medication - he needs to be sure he is only using 1 ml with each injection every 2 weeks - should last him 5 months. I also refilled his Lasix, but only '20mg'$  because his sodium and chloride levels are low, but his kidney function is good. How is the swelling in his stump?  His urine is showing infection and I'd prefer to see his culture results, but I'll go ahead and send an antibiotic, but ask if he is already taking an oral antibiotic for his wound? He had been on one not longer ago per wound care notes.   Let me know, thx!

## 2022-03-24 NOTE — Telephone Encounter (Signed)
FYI//Patient called back in regards to his lab results being looked at. He voiced his frustration at how long it is taking to have this looked at and for something to be prescribed.

## 2022-03-25 ENCOUNTER — Other Ambulatory Visit: Payer: Self-pay | Admitting: Family

## 2022-03-25 DIAGNOSIS — F5104 Psychophysiologic insomnia: Secondary | ICD-10-CM

## 2022-03-25 MED ORDER — FUROSEMIDE 20 MG PO TABS: 20.0000 mg | ORAL_TABLET | Freq: Every day | ORAL | 1 refills | Status: DC

## 2022-03-25 NOTE — Telephone Encounter (Signed)
Pt states he is only taking Vancomycin. And his swelling in his stumps are the same still. Pt would like for his Antibiotic to be sent to [facility] [address].

## 2022-03-26 ENCOUNTER — Telehealth: Payer: Self-pay | Admitting: Family

## 2022-03-26 ENCOUNTER — Other Ambulatory Visit: Payer: Self-pay | Admitting: Family

## 2022-03-26 ENCOUNTER — Other Ambulatory Visit: Payer: Self-pay

## 2022-03-26 DIAGNOSIS — N3000 Acute cystitis without hematuria: Secondary | ICD-10-CM

## 2022-03-26 MED ORDER — CIPROFLOXACIN HCL 500 MG PO TABS: 500.0000 mg | ORAL_TABLET | Freq: Two times a day (BID) | ORAL | 0 refills | Status: DC

## 2022-03-26 MED ORDER — CIPROFLOXACIN HCL 500 MG PO TABS: 500.0000 mg | ORAL_TABLET | Freq: Two times a day (BID) | ORAL | 0 refills | Status: AC

## 2022-03-26 NOTE — Telephone Encounter (Signed)
ok to order

## 2022-03-26 NOTE — Telephone Encounter (Signed)
Pt states he was informed of possible medication reaction by pharmacy.  Pt states he is frustrated because he thinks the PCP team should know about these possibilities before writing a script.  Pt requests a call back from the PCP team when the possible medication reaction is resolved.

## 2022-03-26 NOTE — Telephone Encounter (Signed)
[facility] reports interaction with Amiodarone and Cipro, but states they have not ever filled his Amiodarone. Called and spoke to patient who states he refilled the Amiodarone (Originally prescribed by hospital in January) at [facility] on 6/23 but has not taken any as he has "too many meds to take and don't know what it's for". Advised to not take this while he is on the Cipro for the next 7 days and to discuss the Amiodarone at his Cardiology appointment next Wednesday.

## 2022-03-26 NOTE — Telephone Encounter (Signed)
Patient requests to be called at ph# [phone] asap regarding UTI. Patient states that [facility] faxed urine culture to office 03/25/22. Blood results were sent 03/23/22.  Patient states he needs an RX for an antibiotic immediately to [facility] ([place]), [place] - [address] Phone:  [phone]  Fax:  [phone]      Patient also requests RX be sent for Ambien to the Pharmacy listed above.

## 2022-03-26 NOTE — Telephone Encounter (Signed)
Caller states a possible adverse reaction between two prescriptions.   Caller requests a call back with follow up.  amiodarone (PACERONE) 200 MG tablet [[id number]]  ENDED   And   ciprofloxacin (CIPRO) 500 MG tablet [[id number]]

## 2022-03-26 NOTE — Telephone Encounter (Signed)
..  Home Health Verbal Orders  Agency:  [facility]  Caller: [place] and title  Requesting OT/ PT/ Skilled nursing/ Social Work/ Speech:   OT  Reason for Request:   UTI follow up, services still needed  Frequency:   1x / week for 3 week  HH needs F2F w/in last 30 days

## 2022-03-26 NOTE — Telephone Encounter (Signed)
Please see the MyChart message reply(ies) for my assessment and plan.  The patient gave consent for this Medical Advice Message and is aware that it may result in a bill to their insurance company as well as the possibility that this may result in a co-payment or deductible. They are an established patient, but are not seeking medical advice exclusively about a problem treated during an in person or video visit in the last 7 days. I did not recommend an in person or video visit within 7 days of my reply.  I spent a total of 20 minutes cumulative time within 7 days through [place], NP

## 2022-03-27 ENCOUNTER — Encounter: Payer: Self-pay | Admitting: Family

## 2022-03-27 ENCOUNTER — Telehealth: Payer: Self-pay | Admitting: Family

## 2022-03-27 NOTE — Telephone Encounter (Signed)
Ok to call him with actual results, do not have handy as waiting to be scanned in chart.

## 2022-03-27 NOTE — Telephone Encounter (Signed)
Patient requests to be called at ph# [phone] to be given patient's Testerone levels

## 2022-03-27 NOTE — Telephone Encounter (Signed)
Order already signed and faxed back.

## 2022-03-27 NOTE — Telephone Encounter (Signed)
Lvm letting pt know testosterone level was 392. And if he has any other concerns to give me a call back.

## 2022-03-28 ENCOUNTER — Telehealth: Payer: Self-pay | Admitting: Family Medicine

## 2022-03-28 NOTE — Telephone Encounter (Signed)
On call provider received several calls regarding pt's rx for cipro and UTI.  Per call service pt being less than polite.  Pt called saying he is allergic to the med, however more of a possible intolerance, listed under allergies- renal fx. Per chart review no UA results or Cx results available.  May have been done through [facility].  Labs from 03/20/22 with stable renal function.Pt appears to have contacted the office several times on 7/20 and 7/21 at which time his concerns were a possible interaction between cipro and amiodarone and his Testosterone results.   Per chart review and pharmacy, appears pt is not taking Amiodarone.  Pt was advised to continue Cipro by PCP.  For continued or worsened sx advised to proceed to nearest UC or ED.  Grier Mitts, MD

## 2022-03-30 ENCOUNTER — Telehealth: Payer: Self-pay | Admitting: Family

## 2022-03-30 NOTE — Telephone Encounter (Signed)
..  Home Health Verbal Orders  Agency:  [facility] ph# [phone]  Contact and title Melissa-OT  Requesting OT:    Reason for Request:  Rehabilitation for extremity and IADL skills  Frequency:  1 x per week for 3 weeks and 2 x per week for 2 weeks  Permission to leave detailed message HH needs F2F w/in last 30 days

## 2022-03-30 NOTE — Telephone Encounter (Signed)
FYI--Below are phone notes from patient speaking with [facility] over the weekend.    Patient Name: [name] Gender: Male DOB: 05-Feb-1966 Age: 56 Y 2 M 7 D Return Phone Number: [phone] (Primary) Address: City/ State/ Zip: [place]   [address] Client [place] [facility] [place] Client Site [place] [facility] [employer] Type Call Who Is Calling Patient / Member / Family / Caregiver Call Type Triage / Clinical Relationship To Patient Self Return Phone Number [phone] (Primary) Chief Complaint Prescription Refill or Medication Request (non symptomatic) Reason for Call Medication Question / Request Initial Comment Caller states the medicine his doctor called in he is allergic to so he needs a new prescription called in. Translation No Nurse Assessment Nurse: Surprenant, RN, Beckie Busing Date/Time (Eastern Time): 03/27/2022 11:24:10 PM Please select the assessment type ---Pharmacy clarification Additional Documentation ---Pt reports that he was prescribed CIPRO for a UTI today and he picked up and took his first dose this evening and realized he is allergic to CIPRO. Pt reports his "kidney level dropped" when he took it 5 years ago. Pt denies need for triage of sxs. Is there an on-call physician for the client? ---Yes Do the client directives allow paging the on call for medication concerns? ---Yes Document information that requires clarification. ---Needing different medication for UTI Disp. Time Eilene Ghazi Time) Disposition Final User 03/27/2022 8:53:39 PM Send To Nurse Lonia Farber, RN, Greg 03/27/2022 9:26:30 PM Attempt made - message left McClarnon, RN, [facility] 03/27/2022 9:36:58 PM FINAL ATTEMPT MADE - message left Tressia Danas, RN, [facility] 03/27/2022 9:37:06 PM Send to RN Final Attempt Cleophus Molt, RN, [facility] 03/27/2022 9:40:54 PM Attempt made - message left Little, [place], [facility] 03/27/2022 10:08:44 PM FINAL  ATTEMPT MADE - message left Yes Little, RN, [facility] 03/27/2022 10:08:58 PM Send to RN Final Attempt Mary Esther, RN, [facility] 03/27/2022 10:41:22 PM Attempt made - message left Surprenant, RN, Beckie Busing PLEASE NOTE: All timestamps contained within this report are represented as [place] Standard Time. CONFIDENTIALTY NOTICE: This fax transmission is intended only for the addressee. It contains information that is legally privileged, confidential or otherwise protected from use or disclosure. If you are not the intended recipient, you are strictly prohibited from reviewing, disclosing, copying using or disseminating any of this information or taking any action in reliance on or regarding this information. If you have received this fax in error, please notify us immediately by telephone so that we can arrange for its return to [place]. Phone: [phone], Toll-Free: [phone], Fax: [phone] Page: 2 of 2 Call Id: [id number] [place]. Time Eilene Ghazi Time) Disposition Final User 03/27/2022 10:54:27 PM Attempt made - message left Madison Hickman, RN, [facility] 03/27/2022 11:29:37 PM Called On-Call Provider Surprenant, RN, Monique Final Disposition 03/27/2022 10:08:44 PM FINAL ATTEMPT MADE - message left Yes Little, RN, [facility] Comments User: Danielle Rankin, RN Date/Time Eilene Ghazi Time): 03/27/2022 11:37:38 PM Triager let caller know that on call dr not able to access his chart at this time. She does recommend he go to [facility] tomorrow and to stop taking the Cipro if allergic. Also instructed called to send message to his provider thru mychart and to make sure the pharmacy has all his allergies listed. Paging DoctorName Phone DateTime Result/ Outcome Message Type Notes Grier Mitts- MD [phone] 03/27/2022 11:29:37 PM Called On Call Provider - Reached Doctor Paged Grier Mitts- MD 03/27/2022 11:30:02 PM Spoke with On Call - General Message Result Dr unable to access pts chart. Recommended  pt be  seen in urgent care.

## 2022-03-30 NOTE — Telephone Encounter (Signed)
Patient Name: [name] Gender: Male DOB: 1965/11/28 Age: 56 Y 2 M 7 D Return Phone Number: [phone] (Primary) Address: City/ State/ Zip: [place]  [address] Client [place] [facility] [place] Client Site [place] [facility] [employer] Type Call Who Is Calling Patient / Member / Family / Caregiver Call Type Triage / Clinical Relationship To Patient Self Return Phone Number [phone] (Primary) Chief Complaint Urination Pain Reason for Call Symptomatic / Request for [place] states got medication for UTI. Patient is allergic to this medication. Dr Felipe Drone Translation No Nurse Assessment Nurse: Patsey Berthold, RN, Cassandria Santee Date/Time Eilene Ghazi Time): 03/28/2022 11:08:52 AM Confirm and document reason for call. If symptomatic, describe symptoms. ---Caller states received prescription for Cipro for UTI, he is allergic to Cipro. Denies any symptoms at this time. Does the patient have any new or worsening symptoms? ---No Nurse: Patsey Berthold, RN, Luis Date/Time (Eastern Time): 03/28/2022 11:10:27 AM Please select the assessment type ---Pharmacy clarification Additional Documentation ---Caller states received prescription for Cipro for UTI, he is allergic to Cipro. Denies any symptoms at this time Is there an on-call physician for the client? ---Yes Do the client directives allow paging the on call for medication concerns? ---No Additional Documentation ---Caller states received prescription for Cipro for UTI, he is allergic to Cipro. Denies any symptoms at this time Disp. Time Eilene Ghazi Time) Disposition Final User 03/28/2022 11:20:51 AM Paged On Call back to [facility] Patsey Berthold, [place]andria Santee 03/28/2022 12:18:33 PM Clinical Call Yes Patsey Berthold [place], [place] Final Disposition 03/28/2022 12:18:33 PM Clinical Call Yes Patsey Berthold, RN, [place] PLEASE NOTE: All timestamps contained within this report are represented as  [place] Standard Time. CONFIDENTIALTY NOTICE: This fax transmission is intended only for the addressee. It contains information that is legally privileged, confidential or otherwise protected from use or disclosure. If you are not the intended recipient, you are strictly prohibited from reviewing, disclosing, copying using or disseminating any of this information or taking any action in reliance on or regarding this information. If you have received this fax in error, please notify us immediately by telephone so that we can arrange for its return to [place]. Phone: [phone], Toll-Free: [phone], Fax: [phone] Page: 2 of 2 Call Id: [id number] Comments User: Lerry Liner, RN Date/Time Eilene Ghazi Time): 03/28/2022 11:07:46 AM Do not call for Meds after-hours. User: Lerry Liner, RN Date/Time Eilene Ghazi Time): 03/28/2022 11:19:47 AM Allergies Bactrim, Cipro, Levaquin Pharmacy [facility] [address] [place], [place], [place] [address] [phone] User: Lerry Liner, RN Date/Time Eilene Ghazi Time): 03/28/2022 11:21:19 AM For questions not able to be managed above, only page on call physician if; per your nursing judgement this cannot wait until the next business day. User: Lerry Liner, RN Date/Time Eilene Ghazi Time): 03/28/2022 12:04:32 PM speaking with on-call MD at this time User: Lerry Liner, RN Date/Time Eilene Ghazi Time): 03/28/2022 12:06:57 PM per MD, kidney function within normal limits 03/20/22, patient stated reaction to Cipro previously related to decreased kidney function, and was advised to take Cipro when prescribed User: Lerry Liner, RN Date/Time Eilene Ghazi Time): 03/28/2022 12:17:46 PM spoke with patient verbalized understanding. Paging DoctorName Phone DateTime Result/ Outcome Message Type Notes Grier Mitts- MD [phone] 03/28/2022 11:20:51 AM Paged On Call Back to Call Center Doctor Paged Grier Mitts- MD 03/28/2022 12:07:27 PM Spoke with On Call -  Physician Upgrade Message Result Refer to quicknotes

## 2022-03-30 NOTE — Telephone Encounter (Signed)
Patient has called back in regard.   States he has taken Cipro.  He is calling back to get a HH order to check his kidney functions.  Is requesting call back in regard at [phone].

## 2022-03-31 NOTE — Progress Notes (Deleted)
Office Visit    Patient Name: [name] Date of Encounter: 03/31/2022  PCP:  [name], NP   [facility]  Cardiologist:  Sanda Klein, MD  Advanced Practice Provider:  No care team member to display Electrophysiologist:  None   Chief Complaint    [name] is a 56 y.o. 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 Lamonte Sakai after chest CT disclosed the presence of multivessel CAD and cardiomegaly Presents today for follow-up visit.  He was last seen 05/16/2021 and was describing symptoms consistent with CHF.  He was having exertional dyspnea and swelling in his amputation stump.  His BNP was elevated at 235.  His echocardiogram showed borderline LV function with EF 50 to 55% but there was echo evidence of elevated left atrial filling pressures, the left atrium was severely dilated, there was no meaningful valvular abnormalities.  Patient improved after treatment with diuretics.  Creatinine increased from 1.57-1.81.  We will try decreasing his dose of furosemide but his shortness of breath and swelling at his stump have increased again.  He denies any chest pain.  Noncontrast CT performed 04/2021 showed cardiomegaly, three-vessel CAD and coarsened nodular liver contour suggestive of cirrhosis.  He is skeptical of any medications in general.  His father had bypass surgery at age 94 and later went on to undergo a redo bypass surgery.  Patient made it clear that he will never undergo major surgery.  He is being monitored for a mass in his right kidney by his urologist, Dr. Louis Meckel suspects to represent renal cell carcinoma.  He does not want any surgery for this condition.  Today, he ***  Past Medical History    Past Medical History:  Diagnosis Date    Acquired complex renal cyst 09/25/2019   AKI (acute kidney injury) ([place]) 07/22/2021   Chest pain 24/40/1027   Complication of anesthesia    Decubitus ulcer of sacral area 12/15/2021   Depression    Diabetes mellitus without complication ([place])    Healthcare maintenance 09/25/2019   Hematuria 09/25/2019   Lactic acidosis 12/15/2021   Leukocytosis 09/13/2021   Sacral pressure injury of skin 07/27/2021   Septic shock ([place]) 07/22/2021   Severe sepsis with acute organ dysfunction ([place]) 07/22/2021   Shortness of breath 05/16/2021   Stage 3a chronic kidney disease ([place]) 2/53/6644   Systolic dysfunction    Type 2 diabetes mellitus 10/12/2019   Past Surgical History:  Procedure Laterality Date   AMPUTATION Left 07/23/2021   Procedure: LEFT BELOW KNEE AMPUTATION;  Surgeon: [name], MD;  Location: [place];  Service: Orthopedics;  Laterality: Left;   BELOW KNEE LEG AMPUTATION Right    BUBBLE STUDY  07/29/2021   Procedure: BUBBLE STUDY;  Surgeon: Sueanne Margarita, MD;  Location: [place];  Service: Cardiovascular;;   CARDIOVERSION N/A 07/29/2021   Procedure: CARDIOVERSION;  Surgeon: Sueanne Margarita, MD;  Location: [facility] ENDOSCOPY;  Service: Cardiovascular;  Laterality: N/A;   RADIOLOGY WITH ANESTHESIA N/A 08/05/2021   Procedure: MRI LUMBAR WITH AND WITHOUT; THORASIC SPINE WITH AND WITHOUT WITH ANESTHESIA;  Surgeon: Radiologist, Medication, MD;  Location: [place];  Service: Radiology;  Laterality: N/A;   RADIOLOGY WITH ANESTHESIA N/A 08/07/2021   Procedure: MRI WITH LUMBER WITH AND WITHOUT CONTRAST,THORACIC WITH AND WITHOUT CONTRAST;  Surgeon: Radiologist,  Medication, MD;  Location: [place];  Service: Radiology;  Laterality: N/A;   RADIOLOGY WITH ANESTHESIA N/A 09/13/2021   Procedure: MRI WITH ANESTHESIA;  Surgeon: Luanne Bras, MD;  Location: [place];  Service: Radiology;  Laterality: N/A;   TEE WITHOUT CARDIOVERSION N/A 07/29/2021   Procedure: TRANSESOPHAGEAL ECHOCARDIOGRAM (TEE);  Surgeon: Sueanne Margarita, MD;  Location: [facility] ENDOSCOPY;  Service: Cardiovascular;  Laterality: N/A;    Allergies  Allergies  Allergen Reactions   Bactrim [Sulfamethoxazole-Trimethoprim]    Ceprotin [Protein C Concentrate (Human)]    Levaquin [Levofloxacin]     EKGs/Labs/Other Studies Reviewed:   The following studies were reviewed today:  Coronary CTA 05/23/2021  IMPRESSION: 1. Coronary calcium score of 243. This was 59 percentile for age and sex matched control.   2. Normal coronary origin with right dominance.   3. Diffuse calcified plaque of LAD, RCA. There is significant stenosis of mid first diagonal branch, appears too small for PCI (FFR 0.64).   4.  Aortic atherosclerosis.  Echocardiogram 07/29/2021  IMPRESSIONS     1. Left ventricular ejection fraction, by estimation, is 35 to 40%. The  left ventricle has moderately decreased function. The left ventricle  demonstrates global hypokinesis.   2. Right ventricular systolic function is moderately reduced. The right  ventricular size is normal.   3. Left atrial size was mildly dilated. No left atrial/left atrial  appendage thrombus was detected.   4. Right atrial size was mildly dilated.   5. The mitral valve is normal in structure. Trivial mitral valve  regurgitation. No evidence of mitral stenosis.   6. The aortic valve is normal in structure. Aortic valve regurgitation is  trivial. No aortic stenosis is present.   7. The inferior vena cava is normal in size with greater than 50%  respiratory variability, suggesting right atrial pressure of 3 mmHg.   8. Cannot exclude a small PFO.   Conclusion(s)/Recommendation(s): Normal biventricular function without  evidence of hemodynamically significant valvular heart disease.   FINDINGS   Left Ventricle: Left ventricular ejection fraction, by estimation, is 35  to 40%. The left ventricle has moderately decreased function. The left  ventricle demonstrates global hypokinesis. The left  ventricular internal  cavity size was normal in size.  There is no left ventricular hypertrophy.   Right Ventricle: The right ventricular size is normal. No increase in  right ventricular wall thickness. Right ventricular systolic function is  moderately reduced.   Left Atrium: Left atrial size was mildly dilated. No left atrial/left  atrial appendage thrombus was detected.   Right Atrium: Right atrial size was mildly dilated.   Pericardium: There is no evidence of pericardial effusion.   Mitral Valve: The mitral valve is normal in structure. There is mild  calcification of the posterior mitral valve leaflet(s). Trivial mitral  valve regurgitation. No evidence of mitral valve stenosis.   Tricuspid Valve: The tricuspid valve is normal in structure. Tricuspid  valve regurgitation is mild . No evidence of tricuspid stenosis.   Aortic Valve: The aortic valve is normal in structure. Aortic valve  regurgitation is trivial. No aortic stenosis is present.   Pulmonic Valve: The pulmonic valve was normal in structure. Pulmonic valve  regurgitation is not visualized. No evidence of pulmonic stenosis.   Aorta: The aortic root is normal in size and structure.   Venous: The inferior vena cava is normal in size with greater than 50%  respiratory variability, suggesting right atrial pressure of 3 mmHg.   IAS/Shunts: There is  redundancy of the interatrial septum. Cannot exclude  a small PFO. Agitated saline contrast was given intravenously to evaluate  for intracardiac shunting.   EKG:  EKG is *** ordered today.  The ekg ordered today demonstrates ***  Recent Labs: 12/15/2021: B Natriuretic Peptide 70.0 12/18/2021: Magnesium 1.7 12/28/2021: ALT 22 12/31/2021: Hemoglobin 14.3; Platelets 253 03/20/2022: BUN 32; Creatinine 1.0; Potassium 4.6; Sodium 131  Recent Lipid Panel    Component Value Date/Time   CHOL 183 12/05/2021 1158   TRIG 127 12/05/2021 1158   HDL 39 (L) 12/05/2021 1158    CHOLHDL 4.7 12/05/2021 1158   LDLCALC 121 (H) 12/05/2021 1158    Risk Assessment/Calculations:  {Does this patient have ATRIAL FIBRILLATION?[phone]}  Home Medications   No outpatient medications have been marked as taking for the 04/01/22 encounter (Appointment) with Elgie Collard, PA-C.     Review of Systems   ***   All other systems reviewed and are otherwise negative except as noted above.  Physical Exam    VS:  There were no vitals taken for this visit. , BMI There is no height or weight on file to calculate BMI.  Wt Readings from Last 3 Encounters:  01/21/22 208 lb (94.3 kg)  12/31/21 208 lb (94.3 kg)  09/14/21 240 lb 4.8 oz (109 kg)     GEN: Well nourished, well developed, in no acute distress. HEENT: normal. Neck: Supple, no JVD, carotid bruits, or masses. Cardiac: ***RRR, no murmurs, rubs, or gallops. No clubbing, cyanosis, edema.  ***Radials/PT 2+ and equal bilaterally.  Respiratory:  ***Respirations regular and unlabored, clear to auscultation bilaterally. GI: Soft, nontender, nondistended. MS: No deformity or atrophy. Skin: Warm and dry, no rash. Neuro:  Strength and sensation are intact. Psych: Normal affect.  Assessment & Plan    CHF HTN DM CKD Right BKA Complex left kidney cyst  No BP recorded.  {Refresh Note OR Click here to enter BP  :1}***      Disposition: Follow up {follow up:15908} with Sanda Klein, MD or APP.  Signed, Elgie Collard, PA-C 03/31/2022, 9:24 PM [facility]

## 2022-04-01 ENCOUNTER — Encounter (HOSPITAL_BASED_OUTPATIENT_CLINIC_OR_DEPARTMENT_OTHER): Payer: Medicare HMO | Admitting: General Surgery

## 2022-04-01 ENCOUNTER — Ambulatory Visit: Payer: Medicare HMO | Admitting: Physician Assistant

## 2022-04-01 DIAGNOSIS — Z89512 Acquired absence of left leg below knee: Secondary | ICD-10-CM | POA: Diagnosis not present

## 2022-04-01 DIAGNOSIS — L89154 Pressure ulcer of sacral region, stage 4: Secondary | ICD-10-CM | POA: Diagnosis not present

## 2022-04-01 DIAGNOSIS — I509 Heart failure, unspecified: Secondary | ICD-10-CM | POA: Diagnosis not present

## 2022-04-01 DIAGNOSIS — Z89511 Acquired absence of right leg below knee: Secondary | ICD-10-CM | POA: Diagnosis not present

## 2022-04-01 DIAGNOSIS — E119 Type 2 diabetes mellitus without complications: Secondary | ICD-10-CM | POA: Diagnosis not present

## 2022-04-01 DIAGNOSIS — I110 Hypertensive heart disease with heart failure: Secondary | ICD-10-CM | POA: Diagnosis not present

## 2022-04-01 DIAGNOSIS — G8929 Other chronic pain: Secondary | ICD-10-CM | POA: Diagnosis not present

## 2022-04-01 NOTE — Progress Notes (Signed)
[name], [name] ([id number]) Visit Report for 04/01/2022 Arrival Information Details Patient Name: Date of Service: [place], [name] 04/01/2022 1:15 PM Medical Record Number: [id number] Patient Account Number: [id number] Date of Birth/Sex: Treating RN: [birth year] (56 y.o. [name] Primary Care Jaykob Minichiello: [name] Other Clinician: Referring Aria Pickrell: Treating Joia Doyle/Extender: [name] in Treatment: 11 Visit Information History Since Last Visit Added or deleted any medications: No Patient Arrived: Wheel Chair Any new allergies or adverse reactions: No Arrival Time: 13:52 Had a fall or experienced change in No Accompanied By: friend activities of daily living that may affect Transfer Assistance: None risk of falls: Patient Identification Verified: Yes Signs or symptoms of abuse/neglect since last visito No Secondary Verification Process Completed: Yes Hospitalized since last visit: No Patient Requires Transmission-Based Precautions: No Implantable device outside of the clinic excluding No Patient Has Alerts: No cellular tissue based products placed in the center since last visit: Has Dressing in Place as Prescribed: Yes Pain Present Now: Yes Electronic Signature(s) Signed: 04/01/2022 2:57:53 PM By: Baruch Gouty RN, BSN Entered By: Baruch Gouty on 04/01/2022 13:54:27 -------------------------------------------------------------------------------- Encounter Discharge Information Details Patient Name: Date of Service: [name], [name] 04/01/2022 1:15 PM Medical Record Number: [id number] Patient Account Number: [id number] Date of Birth/Sex: Treating RN: 03/10/1966 (56 y.o. [name] Primary Care Rhylei Mcquaig: [name] Other Clinician: Referring Beckham Buxbaum: Treating Debbie Yearick/Extender: [name] in Treatment: 11 Encounter Discharge Information Items Post Procedure  Vitals Discharge Condition: Stable Temperature (F): 98.1 Ambulatory Status: Wheelchair Pulse (bpm): 83 Discharge Destination: Home Respiratory Rate (breaths/min): 18 Transportation: Private Auto Blood Pressure (mmHg): 113/73 Accompanied By: friend Schedule Follow-up Appointment: Yes Clinical Summary of Care: Patient Declined Electronic Signature(s) Signed: 04/01/2022 2:57:53 PM By: Baruch Gouty RN, BSN Entered By: Baruch Gouty on 04/01/2022 14:32:51 -------------------------------------------------------------------------------- Lower Extremity Assessment Details Patient Name: Date of Service: [name], [name] 04/01/2022 1:15 PM Medical Record Number: [id number] Patient Account Number: [id number] Date of Birth/Sex: Treating RN: July 13, 1966 (56 y.o. [name] Primary Care Bijal Siglin: [name] Other Clinician: Referring Trinidad Petron: Treating Careena Degraffenreid/Extender: [name] in Treatment: 11 Electronic Signature(s) Signed: 04/01/2022 2:57:53 PM By: Baruch Gouty RN, BSN Entered By: Baruch Gouty on 04/01/2022 13:56:12 -------------------------------------------------------------------------------- Multi Wound Chart Details Patient Name: Date of Service: [name], [name] 04/01/2022 1:15 PM Medical Record Number: [id number] Patient Account Number: [id number] Date of Birth/Sex: Treating RN: [birth year] (56 y.o. [name] Primary Care Yonathan Perrow: [name] Other Clinician: Referring Temple Ewart: Treating Major Santerre/Extender: [name] in Treatment: 11 Vital Signs Height(in): Capillary Blood Glucose(mg/dl): 175 Weight(lbs): Pulse(bpm): 83 Body Mass Index(BMI): Blood Pressure(mmHg): 113/73 Temperature(F): 98.1 Respiratory Rate(breaths/min): 18 Photos: [N/A:N/A] Sacrum N/A N/A Wound Location: Pressure Injury N/A N/A Wounding Event: Pressure Ulcer N/A N/A Primary  Etiology: Congestive Heart Failure, N/A N/A Comorbid History: Hypertension, Type II Diabetes, Osteomyelitis, Confinement Anxiety 07/29/2021 N/A N/A Date Acquired: 11 N/A N/A Weeks of Treatment: Open N/A N/A Wound Status: No N/A N/A Wound Recurrence: 1.5x1.5x0.9 N/A N/A Measurements L x W x D (cm) 1.767 N/A N/A A (cm) : rea 1.59 N/A N/A Volume (cm) : 70.00% N/A N/A % Reduction in A rea: 75.50% N/A N/A % Reduction in Volume: 12 Starting Position 1 (o'clock): 12 Ending Position 1 (o'clock): 1.5 Maximum Distance 1 (cm): Yes N/A N/A Undermining: Category/Stage IV N/A N/A Classification: Medium N/A N/A Exudate A mount: Serous N/A N/A Exudate Type: amber N/A N/A Exudate Color: Epibole N/A N/A Wound Margin: Large (67-100%) N/A  N/A Granulation Amount: Red, Pink N/A N/A Granulation Quality: Small (1-33%) N/A N/A Necrotic Amount: Fat Layer (Subcutaneous Tissue): Yes N/A N/A Exposed Structures: Fascia: No Tendon: No Muscle: No Joint: No Bone: No Small (1-33%) N/A N/A Epithelialization: Debridement - Selective/Open Wound N/A N/A Debridement: Pre-procedure Verification/Time Out 14:10 N/A N/A Taken: Lidocaine 4% Topical Solution N/A N/A Pain Control: Callus N/A N/A Tissue Debrided: Skin/Epidermis N/A N/A Level: 4 N/A N/A Debridement A (sq cm): rea Curette N/A N/A Instrument: Minimum N/A N/A Bleeding: Pressure N/A N/A Hemostasis A chieved: 0 N/A N/A Procedural Pain: 0 N/A N/A Post Procedural Pain: Procedure was tolerated well N/A N/A Debridement Treatment Response: 1.5x1.5x0.9 N/A N/A Post Debridement Measurements L x W x D (cm) 1.59 N/A N/A Post Debridement Volume: (cm) Category/Stage IV N/A N/A Post Debridement Stage: macerated wound edges N/A N/A Assessment Notes: Debridement N/A N/A Procedures Performed: Treatment Notes Electronic Signature(s) Signed: 04/01/2022 2:16:14 PM By: [name] MD FACS Signed: 04/01/2022 2:57:53 PM  By: Baruch Gouty RN, BSN Entered By: [name] on 04/01/2022 14:16:13 -------------------------------------------------------------------------------- Multi-Disciplinary Care Plan Details Patient Name: Date of Service: [facility], [name] 04/01/2022 1:15 PM Medical Record Number: [id number] Patient Account Number: [id number] Date of Birth/Sex: Treating RN: 26-May-1966 (56 y.o. [name] Primary Care Lannie Yusuf: [name] Other Clinician: Referring Brianna Bennett: Treating Carlous Olivares/Extender: [name] in Treatment: 11 Multidisciplinary Care Plan reviewed with physician Active Inactive Nutrition Nursing Diagnoses: Impaired glucose control: actual or potential Potential for alteratiion in Nutrition/Potential for imbalanced nutrition Goals: Patient/caregiver will maintain therapeutic glucose control Date Initiated: 01/08/2022 Target Resolution Date: 04/30/2022 Goal Status: Active Interventions: Assess HgA1c results as ordered upon admission and as needed Assess patient nutrition upon admission and as needed per policy Provide education on elevated blood sugars and impact on wound healing Treatment Activities: Dietary management education, guidance and counseling : 01/08/2022 Patient referred to Primary Care Physician for further nutritional evaluation : 01/08/2022 Notes: Pressure Nursing Diagnoses: Knowledge deficit related to causes and risk factors for pressure ulcer development Knowledge deficit related to management of pressures ulcers Potential for impaired tissue integrity related to pressure, friction, moisture, and shear Goals: Patient/caregiver will verbalize understanding of pressure ulcer management Date Initiated: 01/08/2022 Target Resolution Date: 04/30/2022 Goal Status: Active Interventions: Assess: immobility, friction, shearing, incontinence upon admission and as needed Assess offloading mechanisms upon admission and as  needed Assess potential for pressure ulcer upon admission and as needed Notes: Wound/Skin Impairment Nursing Diagnoses: Impaired tissue integrity Knowledge deficit related to ulceration/compromised skin integrity Goals: Patient/caregiver will verbalize understanding of skin care regimen Date Initiated: 01/08/2022 Target Resolution Date: 04/30/2022 Goal Status: Active Ulcer/skin breakdown will have a volume reduction of 30% by week 4 Date Initiated: 01/08/2022 Date Inactivated: 02/18/2022 Target Resolution Date: 02/05/2022 Goal Status: Unmet Unmet Reason: VAC leaking Ulcer/skin breakdown will have a volume reduction of 50% by week 8 Date Initiated: 02/18/2022 Date Inactivated: 03/04/2022 Target Resolution Date: 03/05/2022 Goal Status: Unmet Unmet Reason: infection Ulcer/skin breakdown will have a volume reduction of 80% by week 12 Date Initiated: 03/04/2022 Date Inactivated: 04/01/2022 Target Resolution Date: 04/02/2022 Goal Status: Unmet Unmet Reason: too much moisture Interventions: Assess patient/caregiver ability to obtain necessary supplies Assess patient/caregiver ability to perform ulcer/skin care regimen upon admission and as needed Assess ulceration(s) every visit Provide education on ulcer and skin care Treatment Activities: Skin care regimen initiated : 01/08/2022 Topical wound management initiated : 01/08/2022 Notes: Electronic Signature(s) Signed: 04/01/2022 2:57:53 PM By: Baruch Gouty RN, BSN Entered By: Baruch Gouty on  04/01/2022 14:06:48 -------------------------------------------------------------------------------- Pain Assessment Details Patient Name: Date of Service: [name] 04/01/2022 1:15 PM Medical Record Number: [id number] Patient Account Number: [id number] Date of Birth/Sex: Treating RN: Feb 25, 1966 (56 y.o. [name] Primary Care Kiona Blume: [name] Other Clinician: Referring Apoorva Bugay: Treating Jermie Hippe/Extender: [name] in Treatment: 11 Active Problems Location of Pain Severity and Description of Pain Patient Has Paino Yes Site Locations Pain Location: Pain in Ulcers With Dressing Change: Yes Duration of the Pain. Constant / Intermittento Constant Rate the pain. Current Pain Level: 7 Worst Pain Level: 9 Least Pain Level: 5 Character of Pain Describe the Pain: Aching Pain Management and Medication Current Pain Management: Medication: Yes Is the Current Pain Management Adequate: Adequate How does your wound impact your activities of daily livingo Sleep: Yes Bathing: No Appetite: No Relationship With Others: No Bladder Continence: No Emotions: Yes Bowel Continence: No Work: No Toileting: No Drive: No Dressing: No Hobbies: No Electronic Signature(s) Signed: 04/01/2022 2:57:53 PM By: Baruch Gouty RN, BSN Entered By: Baruch Gouty on 04/01/2022 13:56:06 -------------------------------------------------------------------------------- Patient/Caregiver Education Details Patient Name: Date of Service: [name], [name] 7/26/2023andnbsp1:15 PM Medical Record Number: [id number] Patient Account Number: [id number] Date of Birth/Gender: Treating RN: 01/06/1966 (56 y.o. [name] Primary Care Physician: [name] Other Clinician: Referring Physician: Treating Physician/Extender: [name] in Treatment: 11 Education Assessment Education Provided To: Patient Education Topics Provided Elevated Blood Sugar/ Impact on Healing: Methods: Explain/Verbal Responses: Reinforcements needed, State content correctly Pressure: Methods: Explain/Verbal Responses: Reinforcements needed, State content correctly Wound/Skin Impairment: Methods: Explain/Verbal Responses: Reinforcements needed, State content correctly Electronic Signature(s) Signed: 04/01/2022 2:57:53 PM By: Baruch Gouty RN, BSN Entered By:  Baruch Gouty on 04/01/2022 14:07:13 -------------------------------------------------------------------------------- Wound Assessment Details Patient Name: Date of Service: [name], [name] Rayland 04/01/2022 1:15 PM Medical Record Number: [id number] Patient Account Number: [id number] Date of Birth/Sex: Treating RN: 1966/05/11 (56 y.o. [name] Primary Care Ramesha Poster: [name] Other Clinician: Referring Michaelia Beilfuss: Treating Patt Steinhardt/Extender: [name] in Treatment: 11 Wound Status Wound Number: 1 Primary Pressure Ulcer Etiology: Wound Location: Sacrum Wound Open Wounding Event: Pressure Injury Status: Date Acquired: 07/29/2021 Comorbid Congestive Heart Failure, Hypertension, Type II Diabetes, Weeks Of Treatment: 11 History: Osteomyelitis, Confinement Anxiety Clustered Wound: No Photos Wound Measurements Length: (cm) 1.5 Width: (cm) 1.5 Depth: (cm) 0.9 Area: (cm) 1.767 Volume: (cm) 1.59 % Reduction in Area: 70% % Reduction in Volume: 75.5% Epithelialization: Small (1-33%) Tunneling: No Undermining: Yes Starting Position (o'clock): 12 Ending Position (o'clock): 12 Maximum Distance: (cm) 1.5 Wound Description Classification: Category/Stage IV Wound Margin: Epibole Exudate Amount: Medium Exudate Type: Serous Exudate Color: amber Foul Odor After Cleansing: No Slough/Fibrino Yes Wound Bed Granulation Amount: Large (67-100%) Exposed Structure Granulation Quality: Red, Pink Fascia Exposed: No Necrotic Amount: Small (1-33%) Fat Layer (Subcutaneous Tissue) Exposed: Yes Necrotic Quality: Adherent Slough Tendon Exposed: No Muscle Exposed: No Joint Exposed: No Bone Exposed: No Assessment Notes macerated wound edges Treatment Notes Wound #1 (Sacrum) Cleanser Peri-Wound Care Topical Nystop Nystatin Powder, 15 (g) bottle Discharge Instruction: may use thin layer of powder with skin prep to rash on buttocks [employer]  antibiotic compound Discharge Instruction: apply to base of wound under the [facility] with dressing changes Primary Dressing VAC Discharge Instruction: be sure to place sponge in undermining and drape under the bridge Secondary Dressing Secured With Compression Wrap Compression Stockings Add-Ons Electronic Signature(s) Signed: 04/01/2022 2:57:53 PM By: Baruch Gouty RN, BSN Entered By: Baruch Gouty on 04/01/2022  14:04:02 -------------------------------------------------------------------------------- Vitals Details Patient Name: Date of ServiceCyndie Mull NN, [name] 04/01/2022 1:15 PM Medical Record Number: [id number] Patient Account Number: [id number] Date of Birth/Sex: Treating RN: 06/20/1966 (56 y.o. [name] Primary Care Cyril Woodmansee: [name] Other Clinician: Referring Jaz Laningham: Treating Ralph Benavidez/Extender: [name] in Treatment: 11 Vital Signs Time Taken: 13:54 Temperature (F): 98.1 Pulse (bpm): 83 Respiratory Rate (breaths/min): 18 Blood Pressure (mmHg): 113/73 Capillary Blood Glucose (mg/dl): 175 Reference Range: 80 - 120 mg / dl Electronic Signature(s) Signed: 04/01/2022 2:57:53 PM By: Baruch Gouty RN, BSN Entered By: Baruch Gouty on 04/01/2022 13:55:31

## 2022-04-01 NOTE — Progress Notes (Signed)
[name], [name] ([id number]) Visit Report for 04/01/2022 Chief Complaint Document Details Patient Name: Date of Service: [name], [name] 04/01/2022 1:15 PM Medical Record Number: [id number] Patient Account Number: [id number] Date of Birth/Sex: Treating RN: 1966-01-18 (56 y.o. [name] Primary Care Provider: Jeanie [name] Other Clinician: Referring Provider: Treating Provider/Extender: [name] in Treatment: 11 Information Obtained from: Patient Chief Complaint Patient is at the clinic for treatment of an open pressure ulcer Electronic Signature(s) Signed: 04/01/2022 2:16:21 PM By: [name] Maudlin MD FACS Entered By: [name] on 04/01/2022 14:16:20 -------------------------------------------------------------------------------- Debridement Details Patient Name: Date of Service: [name] [name] 04/01/2022 1:15 PM Medical Record Number: [id number] Patient Account Number: [id number] Date of Birth/Sex: Treating RN: 02-07-1966 (56 y.o. [name] Primary Care Provider: Jeanie [name] Other Clinician: Referring Provider: Treating Provider/Extender: [name] in Treatment: 11 Debridement Performed for Assessment: Wound #1 Sacrum Performed By: Physician [name] Maudlin, MD Debridement Type: Debridement Level of Consciousness (Pre-procedure): Awake and Alert Pre-procedure Verification/Time Out Yes - 14:10 Taken: Start Time: 14:10 Pain Control: Lidocaine 4% Topical Solution T Area Debrided (L x W): otal 2 (cm) x 2 (cm) = 4 (cm) Tissue and other material debrided: Non-Viable, Callus, Skin: Epidermis Level: Skin/Epidermis Debridement Description: Selective/Open Wound Instrument: Curette Bleeding: Minimum Hemostasis Achieved: Pressure Procedural Pain: 0 Post Procedural Pain: 0 Response to Treatment: Procedure was tolerated well Level of Consciousness (Post- Awake and  Alert procedure): Post Debridement Measurements of Total Wound Length: (cm) 1.5 Stage: Category/Stage IV Width: (cm) 1.5 Depth: (cm) 0.9 Volume: (cm) 1.59 Character of Wound/Ulcer Post Debridement: Improved Post Procedure Diagnosis Same as Pre-procedure Electronic Signature(s) Signed: 04/01/2022 2:57:53 PM By: [name] Gouty RN, BSN Signed: 04/01/2022 2:58:48 PM By: [name] Maudlin MD FACS Entered By: [name] on 04/01/2022 14:11:40 -------------------------------------------------------------------------------- HPI Details Patient Name: Date of Service: [name] [name] 04/01/2022 1:15 PM Medical Record Number: [id number] Patient Account Number: [id number] Date of Birth/Sex: Treating RN: 22-Nov-1965 (56 y.o. [name] Primary Care Provider: Jeanie [name] Other Clinician: Referring Provider: Treating Provider/Extender: [name] in Treatment: 11 History of Present Illness HPI Description: ADMISSION 01/08/2022 This is a 56 year old male with a past medical history notable for type 2 diabetes mellitus (last A1c was 8.6) congestive heart failure, hypertension, chronic pain, and bilateral below-knee amputations. His most recent amputation was in November 2022. While he was in the hospital, he developed a sacral pressure ulcer. He was subsequently in a skilled nursing facility for some time. He was discharged with home health and had been in a wound VAC, but was then admitted to the hospital last week when the wound appeared to be worsening. Apparently the periwound skin was macerated and the device that he had been using was leaking quite a bit. Evaluation while in the hospital included a consultation with infectious disease who did not think he had any evidence of osteomyelitis, plastic surgery who felt that he was not an appropriate flap candidate (their note also states that he is not interested in a flap) and wound care who took him  out of the wound VAC and initiated wet-to-dry dressing changes. He has a new wound VAC from [facility] on order, anticipated delivery today. The wound itself is fairly small and isolated to the sacrum. There is muscle exposed. No bone is appreciated but it is palpable beneath the surface. The muscle itself is bit pale and there is heaped up epibole around the wound edges. No significant odor or  drainage. 01/14/2022: His wound VAC was not initiated until this past Friday. He has not had any issues with the VAC but today we found that the bridge foam was applied directly to the skin rather than over a layer of adhesive drape. His home health nurse also requested that we consider applying silver collagen to the wound bed in addition to the VAC. There is still a little bit of heaped up senescent skin around the wound periphery. No significant change to the wound dimensions. 01/21/2022: No significant change to the wound dimensions. The senescent skin has not reaccumulated. The periwound skin remains a bit macerated but without any obvious breakdown. The wound surface itself has a shiny appearance with a little bit of slough accumulation; no true robust granulation tissue at this time. 01/28/2022: The wound is about the same size but a little bit shallower. There is a little bit of senescent skin reaccumulation at the cranial aspect. The periwound skin is red but not macerated and without any tissue breakdown. The wound still does not have the most robust surface. There is a bit of slough accumulation. 02/04/2022: The wound is a bit smaller and the undermining has come in somewhat. There continues to be granulation tissue formation within the wound bed. No significant slough accumulation and his periwound skin is in better condition. 02/18/2022: The wound stinks today. There is no obvious pus but the drainage and wound itself are malodorous. He continues to have heaped up tissue within the wound bed that is rather  grayish and not particularly robust-appearing. He is very angry today with his situation. 02/25/2022: Last week, in response to the odor coming from the wound, I took a culture and prescribed topical gentamicin as well as oral cefdinir. Apparently [facility] contacted him about a topical compounded antibiotic, but he did not realize this and he hung up on them. Today, the odor is no longer present. There is some senescent skin around the wound margins as well as continued heaped up granulation tissue in the center of the wound. The undermining continues to contract. 03/04/2022: The wound continues to contract and look better. He still has heaped up hypertrophic granulation tissue near the orifice. No odor was coming from the wound today. He is awaiting [place] delivery. 03/18/2022: 2-week follow-up. [facility] topical antibiotic has been initiated. The chemical cauterization of the hypertrophic granulation tissue was quite successful and the surface is much flatter today. He has heaped up senescent skin around the perimeter. His home health providers have figured out a way to keep the wound VAC from losing suction by bolstering with DuoDERM. Overall there has been significant improvement since our last visit. 04/01/2022: 2-week follow-up. The wound continues to contract. Once again, there is heaped up senescent skin around the perimeter. He has a little bit of skin breakdown in the distribution of the adhesive drape from the wound VAC. No odor or purulent drainage. No concern for infection. Electronic Signature(s) Signed: 04/01/2022 2:17:09 PM By: [name] Maudlin MD FACS Entered By: [name] on 04/01/2022 14:17:08 -------------------------------------------------------------------------------- Physical Exam Details Patient Name: Date of Service: [name] [name] 04/01/2022 1:15 PM Medical Record Number: [id number] Patient Account Number: [id number] Date of Birth/Sex: Treating RN: Apr 25, 1966 (56  y.o. [name] Primary Care Provider: Jeanie [name] Other Clinician: Referring Provider: Treating Provider/Extender: [name] in Treatment: 11 Constitutional . . . . No acute distress.Marland Kitchen Respiratory Normal work of breathing on room air.. Notes 04/01/2022: The wound continues to contract. Once again,  there is heaped up senescent skin around the perimeter. He has a little bit of skin breakdown in the distribution of the adhesive drape from the wound VAC. No odor or purulent drainage. No concern for infection. Electronic Signature(s) Signed: 04/01/2022 2:17:38 PM By: [name] Maudlin MD FACS Entered By: [name] on 04/01/2022 14:17:38 -------------------------------------------------------------------------------- Physician Orders Details Patient Name: Date of Service: [name] [name] 04/01/2022 1:15 PM Medical Record Number: [id number] Patient Account Number: [id number] Date of Birth/Sex: Treating RN: [birth year] (56 y.o. [name] Primary Care Provider: Jeanie [name] Other Clinician: Referring Provider: Treating Provider/Extender: [name] in Treatment: 11 Verbal / Phone Orders: No Diagnosis Coding ICD-10 Coding Code Description L89.154 Pressure ulcer of sacral region, stage 4 Z89.512 Acquired absence of left leg below knee Z89.511 Acquired absence of right leg below knee Follow-up Appointments ppointment in 2 weeks. - Dr. Celine Ahr RM1 with [facility] Return A Wed. 8/9 @ 2:00 pm Bathing/ Shower/ Hygiene May shower with protection but do not get wound dressing(s) wet. Negative Presssure Wound Therapy Wound #1 Sacrum Wound Vac to wound continuously at 151m/hg pressure Black Foam Off-Loading Gel mattress overlay (Group 1) Turn and reposition every 2 hours Home Health Dressing changes to be completed by [facility] Monday / Wednesday / Friday except when patient has scheduled  visit at [facility] - Be sure no sponge directly on the skin, place drape under the bridge Other Home Health Orders/Instructions: - [facility] Wound Treatment Wound #1 - Sacrum Topical: Nystop Nystatin Powder, 15 (g) bottle 3 x Per Week/30 Days Discharge Instructions: may use thin layer of powder with skin prep to rash on buttocks Topical: [facility] antibiotic compound 3 x Per Week/30 Days Discharge Instructions: apply to base of wound under the VAC with dressing changes Prim Dressing: VAC 3 x Per Week/30 Days ary Discharge Instructions: be sure to place sponge in undermining and drape under the bridge Electronic Signature(s) Signed: 04/01/2022 2:58:48 PM By: CFredirick MaudlinMD FACS Entered By: CFredirick Maudlinon 04/01/2022 14:20:29 -------------------------------------------------------------------------------- Problem List Details Patient Name: Date of Service: HCicero Duck [name] [name] 04/01/2022 1:15 PM Medical Record Number: [id number] Account Number: [id number] of Birth/Sex: Treating RN: [birth year](56y.o. MErnestene MentionPrimary Care Provider: HJeanie SewerOther Clinician: Referring Provider: Treating Provider/Extender: CChristie Nottinghamin Treatment: 11 Active Problems ICD-10 Encounter Code Description Active Date MDM Diagnosis L89.154 Pressure ulcer of sacral region, stage 4 01/08/2022 No Yes Z89.512 Acquired absence of left leg below knee 01/08/2022 No Yes Z89.511 Acquired absence of right leg below knee 01/08/2022 No Yes Inactive Problems Resolved Problems Electronic Signature(s) Signed: 04/01/2022 2:16:08 PM By: CFredirick MaudlinMD FACS Entered By: CFredirick Maudlinon 04/01/2022 14:16:08 -------------------------------------------------------------------------------- Progress Note Details Patient Name: Date of Service: HCicero Duck [name] [name] 04/01/2022 1:15 PM Medical Record Number: [id number] Account Number:  [id number] of Birth/Sex: Treating RN: [birth year](56y.o. MErnestene MentionPrimary Care Provider: Other Clinician: HJeanie SewerReferring Provider: Treating Provider/Extender: CChristie Nottinghamin Treatment: 11 Subjective Chief Complaint Information obtained from Patient Patient is at the clinic for treatment of an open pressure ulcer History of Present Illness (HPI) ADMISSION 01/08/2022 This is a 56year old male with a past medical history notable for type 2 diabetes mellitus (last A1c was 8.6) congestive heart failure, hypertension, chronic pain, and bilateral below-knee amputations. His most recent amputation was in November 2022. While he was in the hospital, he developed a sacral pressure ulcer. He  was subsequently in a skilled nursing facility for some time. He was discharged with home health and had been in a wound VAC, but was then admitted to the hospital last week when the wound appeared to be worsening. Apparently the periwound skin was macerated and the device that he had been using was leaking quite a bit. Evaluation while in the hospital included a consultation with infectious disease who did not think he had any evidence of osteomyelitis, plastic surgery who felt that he was not an appropriate flap candidate (their note also states that he is not interested in a flap) and wound care who took him out of the wound VAC and initiated wet-to-dry dressing changes. He has a new wound VAC from [facility] on order, anticipated delivery today. The wound itself is fairly small and isolated to the sacrum. There is muscle exposed. No bone is appreciated but it is palpable beneath the surface. The muscle itself is bit pale and there is heaped up epibole around the wound edges. No significant odor or drainage. 01/14/2022: His wound VAC was not initiated until this past Friday. He has not had any issues with the VAC but today we found that the bridge foam  was applied directly to the skin rather than over a layer of adhesive drape. His home health nurse also requested that we consider applying silver collagen to the wound bed in addition to the VAC. There is still a little bit of heaped up senescent skin around the wound periphery. No significant change to the wound dimensions. 01/21/2022: No significant change to the wound dimensions. The senescent skin has not reaccumulated. The periwound skin remains a bit macerated but without any obvious breakdown. The wound surface itself has a shiny appearance with a little bit of slough accumulation; no true robust granulation tissue at this time. 01/28/2022: The wound is about the same size but a little bit shallower. There is a little bit of senescent skin reaccumulation at the cranial aspect. The periwound skin is red but not macerated and without any tissue breakdown. The wound still does not have the most robust surface. There is a bit of slough accumulation. 02/04/2022: The wound is a bit smaller and the undermining has come in somewhat. There continues to be granulation tissue formation within the wound bed. No significant slough accumulation and his periwound skin is in better condition. 02/18/2022: The wound stinks today. There is no obvious pus but the drainage and wound itself are malodorous. He continues to have heaped up tissue within the wound bed that is rather grayish and not particularly robust-appearing. He is very angry today with his situation. 02/25/2022: Last week, in response to the odor coming from the wound, I took a culture and prescribed topical gentamicin as well as oral cefdinir. Apparently [facility] contacted him about a topical compounded antibiotic, but he did not realize this and he hung up on them. Today, the odor is no longer present. There is some senescent skin around the wound margins as well as continued heaped up granulation tissue in the center of the wound. The undermining  continues to contract. 03/04/2022: The wound continues to contract and look better. He still has heaped up hypertrophic granulation tissue near the orifice. No odor was coming from the wound today. He is awaiting [place] delivery. 03/18/2022: 2-week follow-up. [facility] topical antibiotic has been initiated. The chemical cauterization of the hypertrophic granulation tissue was quite successful and the surface is much flatter today. He has heaped up senescent skin  around the perimeter. His home health providers have figured out a way to keep the wound VAC from losing suction by bolstering with DuoDERM. Overall there has been significant improvement since our last visit. 04/01/2022: 2-week follow-up. The wound continues to contract. Once again, there is heaped up senescent skin around the perimeter. He has a little bit of skin breakdown in the distribution of the adhesive drape from the wound VAC. No odor or purulent drainage. No concern for infection. Patient History Information obtained from Patient, Caregiver, Chart. Family History Cancer - Father, Heart Disease - Father,Paternal Grandparents, Hypertension - Father, No family history of Diabetes, Hereditary Spherocytosis, Kidney Disease, Lung Disease, Seizures, Stroke, Thyroid Problems, Tuberculosis. Social History Never smoker, Marital Status - Divorced, Alcohol Use - Never, Drug Use - No History, Caffeine Use - Moderate - coffee. Medical History Eyes Denies history of Cataracts, Glaucoma, Optic Neuritis Ear/Nose/Mouth/Throat Denies history of Chronic sinus problems/congestion, Middle ear problems Cardiovascular Patient has history of Congestive Heart Failure, Hypertension Endocrine Patient has history of Type II Diabetes Denies history of Type I Diabetes Genitourinary Denies history of End Stage Renal Disease Integumentary (Skin) Denies history of History of Burn Musculoskeletal Patient has history of Osteomyelitis - S1 and  coccyx Oncologic Denies history of Received Chemotherapy, Received Radiation Psychiatric Patient has history of Confinement Anxiety Denies history of Anorexia/bulimia Hospitalization/Surgery History - bil BKA. Medical A Surgical History Notes nd Respiratory pulmonary nodules Genitourinary renal mass, urinary hesitancy Musculoskeletal discitis of thoracic region Objective Constitutional No acute distress.. Vitals Time Taken: 1:54 PM, Temperature: 98.1 F, Pulse: 83 bpm, Respiratory Rate: 18 breaths/min, Blood Pressure: 113/73 mmHg, Capillary Blood Glucose: 175 mg/dl. Respiratory Normal work of breathing on room air.. General Notes: 04/01/2022: The wound continues to contract. Once again, there is heaped up senescent skin around the perimeter. He has a little bit of skin breakdown in the distribution of the adhesive drape from the wound VAC. No odor or purulent drainage. No concern for infection. Integumentary (Hair, Skin) Wound #1 status is Open. Original cause of wound was Pressure Injury. The date acquired was: 07/29/2021. The wound has been in treatment 11 weeks. The wound is located on the Sacrum. The wound measures 1.5cm length x 1.5cm width x 0.9cm depth; 1.767cm^2 area and 1.59cm^3 volume. There is Fat Layer (Subcutaneous Tissue) exposed. There is no tunneling noted, however, there is undermining starting at 12:00 and ending at 12:00 with a maximum distance of 1.5cm. There is a medium amount of serous drainage noted. The wound margin is epibole. There is large (67-100%) red, pink granulation within the wound bed. There is a small (1-33%) amount of necrotic tissue within the wound bed including Adherent Slough. 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 There was a Selective/Open Wound Skin/Epidermis Debridement with a total area of 4 sq cm performed by [name] Maudlin, MD. With the following instrument(s): Curette to remove Non-Viable tissue/material. Material removed includes Callus and Skin: Epidermis and after achieving pain control using Lidocaine 4% T opical Solution. No specimens were taken. A time out was conducted at 14:10, prior to the start of the procedure. A Minimum amount of bleeding was controlled with Pressure. The procedure was tolerated well with a pain level of 0 throughout and a pain level of 0 following  the procedure. Post Debridement Measurements: 1.5cm length x 1.5cm width x 0.9cm depth; 1.59cm^3 volume. Post debridement Stage noted as Category/Stage IV. Character of Wound/Ulcer Post Debridement is improved. Post procedure Diagnosis Wound #1: Same as Pre-Procedure Plan Follow-up Appointments: Return Appointment in 2 weeks. - Dr. Celine Ahr RM1 with [facility]. 8/9 @ 2:00 pm Bathing/ Shower/ Hygiene: May shower with protection but do not get wound dressing(s) wet. Negative Presssure Wound Therapy: Wound #1 Sacrum: Wound Vac to wound continuously at 169m/hg pressure Black Foam Off-Loading: Gel mattress overlay (Group 1) Turn and reposition every 2 hours Home Health: Dressing changes to be completed by [place] / Wednesday / Friday [facility] Be sure no sponge directly on the skin, place drape under the bridge Other Home Health Orders/Instructions: - [facility] WOUND #1: - Sacrum Wound Laterality: Topical: Nystop Nystatin Powder, 15 (g) bottle 3 x Per Week/30 Days Discharge Instructions: may use thin layer of powder with skin prep to rash on buttocks Topical: [facility] antibiotic compound 3 x Per Week/30 Days Discharge Instructions: apply to base of wound under the VAC with dressing changes Prim Dressing: VAC 3 x Per Week/30  Days ary Discharge Instructions: be sure to place sponge in undermining and drape under the bridge 04/01/2022: The wound continues to contract. Once again, there is heaped up senescent skin around the perimeter. He has a little bit of skin breakdown in the distribution of the adhesive drape from the wound VAC. No odor or purulent drainage. No concern for infection. I used a curette to debride the senescent skin from around the wound perimeter as well as a light layer of slough/biofilm from the wound surface. We will use a little topical nystatin powder with the barrier film to try and protect his skin in the drape distribution. Continue wound VAC. Follow-up in 2 weeks. Electronic Signature(s) Signed: 04/01/2022 2:21:13 PM By: CFredirick MaudlinMD FACS Entered By: CFredirick Maudlinon 04/01/2022 14:21:13 -------------------------------------------------------------------------------- HxROS Details Patient Name: Date of Service: HCicero Duck [name] [name] 04/01/2022 1:15 PM Medical Record Number: [id number] Account Number: [id number] of Birth/Sex: Treating RN: 502/18/1967(56y.o. MErnestene MentionPrimary Care Provider: HJeanie SewerOther Clinician: Referring Provider: Treating Provider/Extender: CChristie Nottinghamin Treatment: 11 Information Obtained From Patient Caregiver Chart Eyes Medical History: Negative for: Cataracts; Glaucoma; Optic Neuritis Ear/Nose/Mouth/Throat Medical History: Negative for: Chronic sinus problems/congestion; Middle ear problems Respiratory Medical History: Past Medical History Notes: pulmonary nodules Cardiovascular Medical History: Positive for: Congestive Heart Failure; Hypertension Endocrine Medical History: Positive for: Type II Diabetes Negative for: Type I Diabetes Time with diabetes: since 2004 Treated with: Insulin Blood sugar tested every day: Yes Tested : 4 times per day Genitourinary Medical  History: Negative for: End Stage Renal Disease Past Medical History Notes: renal mass, urinary hesitancy Integumentary (Skin) Medical History: Negative for: History of Burn Musculoskeletal Medical History: Positive for: Osteomyelitis - S1 and coccyx Past Medical History Notes: discitis of thoracic region Oncologic Medical History: Negative for: Received Chemotherapy; Received Radiation Psychiatric Medical History: Positive for: Confinement Anxiety Negative for: Anorexia/bulimia Immunizations Pneumococcal Vaccine: Received Pneumococcal Vaccination: No Implantable Devices No devices added Hospitalization / Surgery History Type of Hospitalization/Surgery bil BKA Family and Social History Cancer: Yes - Father; Diabetes: No; Heart Disease: Yes - Father,Paternal Grandparents; Hereditary Spherocytosis: No; Hypertension: Yes - Father; Kidney Disease: No; Lung Disease: No; Seizures: No; Stroke: No; Thyroid Problems: No; Tuberculosis: No; Never smoker; Marital Status -  Divorced; Alcohol Use: Never; Drug Use: No History; Caffeine Use: Moderate - coffee; Financial Concerns: No; Food, Clothing or Shelter Needs: No; Support System Lacking: No; Transportation Concerns: Yes - hurts to transfer Electronic Signature(s) Signed: 04/01/2022 2:57:53 PM By: [name] Gouty RN, BSN Signed: 04/01/2022 2:58:48 PM By: [name] Maudlin MD FACS Entered By: [name] on 04/01/2022 14:17:14 -------------------------------------------------------------------------------- SuperBill Details Patient Name: Date of Service: [name] [name] 04/01/2022 Medical Record Number: [id number] Patient Account Number: [id number] Date of Birth/Sex: Treating RN: December 24, 1965 (56 y.o. [name] Primary Care Provider: Jeanie [name] Other Clinician: Referring Provider: Treating Provider/Extender: [name] in Treatment: 11 Diagnosis Coding ICD-10 Codes Code  Description L89.154 Pressure ulcer of sacral region, stage 4 Z89.512 Acquired absence of left leg below knee Z89.511 Acquired absence of right leg below knee Facility Procedures CPT4 Code: [id number] Description: 7597 - DEBRIDE WOUND 1ST 20 SQ CM OR < ICD-10 Diagnosis Description L89.154 Pressure ulcer of sacral region, stage 4 Modifier: Quantity: 1 Physician Procedures : CPT4 Code Description Modifier [id number] - WC PHYS LEVEL 4 - EST PT 25 ICD-10 Diagnosis Description L89.154 Pressure ulcer of sacral region, stage 4 Z89.512 Acquired absence of left leg below knee Z89.511 Acquired absence of right leg below knee Quantity: 1 : [id number] - WC PHYS DEBR WO ANESTH 20 SQ CM ICD-10 Diagnosis Description L89.154 Pressure ulcer of sacral region, stage 4 Quantity: 1 Electronic Signature(s) Signed: 04/01/2022 2:21:33 PM By: [name] Maudlin MD FACS Entered By: [name] on 04/01/2022 14:21:33

## 2022-04-03 ENCOUNTER — Telehealth: Payer: Self-pay | Admitting: Family

## 2022-04-03 DIAGNOSIS — M47896 Other spondylosis, lumbar region: Secondary | ICD-10-CM | POA: Diagnosis not present

## 2022-04-03 DIAGNOSIS — M5136 Other intervertebral disc degeneration, lumbar region: Secondary | ICD-10-CM | POA: Diagnosis not present

## 2022-04-03 NOTE — Telephone Encounter (Signed)
..  Home Health Certification or Plan of Care Tracking  Is this a Certification or Plan of Care? Yes  [place]: [place]  Order Number:  [id number]  Has charge sheet been attached? yes  Where has form been placed:  In provider's box  Faxed to:   [phone]

## 2022-04-06 ENCOUNTER — Telehealth: Payer: Self-pay | Admitting: Family

## 2022-04-06 ENCOUNTER — Ambulatory Visit (INDEPENDENT_AMBULATORY_CARE_PROVIDER_SITE_OTHER): Payer: Medicare HMO

## 2022-04-06 DIAGNOSIS — Z0000 Encounter for general adult medical examination without abnormal findings: Secondary | ICD-10-CM

## 2022-04-06 NOTE — Progress Notes (Signed)
Virtual Visit via Telephone Note  I connected with  [name] on 04/06/22 at  2:00 PM EDT by telephone and verified that I am speaking with the correct person using two identifiers.  Medicare Annual Wellness visit completed telephonically due to Covid-19 pandemic.   Persons participating in this call: This Health Coach and this patient.   Location: Patient: home Provider: office   I discussed the limitations, risks, security and privacy concerns of performing an evaluation and management service by telephone and the availability of in person appointments. The patient expressed understanding and agreed to proceed.  Unable to perform video visit due to video visit attempted and failed and/or patient does not have video capability.   Some vital signs may be absent or patient reported.   Willette Brace, LPN   Subjective:   [name] is a 56 y.o. male who presents for an Initial Medicare Annual Wellness Visit.  Review of Systems     Cardiac Risk Factors include: advanced age (>31mn, >>38women);diabetes mellitus;hypertension;dyslipidemia;male gender;obesity (BMI >30kg/m2)     Objective:    There were no vitals filed for this visit. There is no height or weight on file to calculate BMI.     04/06/2022    2:09 PM 12/29/2021    6:00 PM 12/28/2021    8:58 PM 12/16/2021    4:00 AM 12/15/2021   10:58 AM 09/30/2021    1:55 PM 09/13/2021    9:57 AM  Advanced Directives  Does Patient Have a Medical Advance Directive? _0  No No  Would patient like information on creating a medical advance directive? No - Patient declined No - Patient declined  No - Patient declined  No - Patient declined No - Patient declined    Current Medications (verified) Outpatient Encounter Medications as of 04/06/2022  Medication Sig   blood glucose meter kit and supplies KIT Dispense based on patient and insurance preference. Use up to four times daily as directed.   busPIRone (BUSPAR) 5 MG  tablet Take 1-2 tablets (5-10 mg total) by mouth 3 (three) times daily.   furosemide (LASIX) 20 MG tablet Take 1 tablet (20 mg total) by mouth daily.   insulin NPH-regular Human (NOVOLIN 70/30 RELION) (70-30) 100 UNIT/ML injection Inject 15 Units into the skin 2 (two) times daily with a meal.   Insulin Pen Needle 32G X 4 MM MISC Use as directed   insulin regular (NOVOLIN R) 100 units/mL injection Inject 0-20 Units into the skin 4 (four) times daily as needed for high blood sugar.   Omega-3 Fatty Acids (FISH OIL PO) Take by mouth.   Oxycodone HCl 10 MG TABS Take 10 mg by mouth 3 (three) times daily as needed.   protein supplement shake (PREMIER PROTEIN) LIQD Take 2 oz by mouth 3 (three) times daily with meals.   testosterone cypionate (DEPOTESTOSTERONE CYPIONATE) 200 MG/ML injection Inject 1 mL (200 mg total) into the muscle every 14 (fourteen) days.   zolpidem (AMBIEN) 10 MG tablet TAKE 1 TABLET BY MOUTH AT BEDTIME AS NEEDED FOR  SLEEP.  TAKE  1  HOUR  PRIOR  TO  BEDTIME.   lisinopril (ZESTRIL) 10 MG tablet Take 10 mg by mouth daily.   [DISCONTINUED] metoprolol succinate (TOPROL-XL) 25 MG 24 hr tablet Take 1 tablet (25 mg total) by mouth daily.   No facility-administered encounter medications on file as of 04/06/2022.    Allergies (verified) Bactrim [sulfamethoxazole-trimethoprim], Ceprotin [protein c concentrate (human)], Ciprofloxacin, Insulin glargine,  and Levaquin [levofloxacin]   History: Past Medical History:  Diagnosis Date   Acquired complex renal cyst 09/25/2019   AKI (acute kidney injury) ([place]) 07/22/2021   Chest pain 72/62/0355   Complication of anesthesia    Decubitus ulcer of sacral area 12/15/2021   Depression    Diabetes mellitus without complication ([place])    Healthcare maintenance 09/25/2019   Hematuria 09/25/2019   Lactic acidosis 12/15/2021   Leukocytosis 09/13/2021   Sacral pressure injury of skin 07/27/2021   Septic shock ([place]) 07/22/2021   Severe sepsis with acute  organ dysfunction ([place]) 07/22/2021   Shortness of breath 05/16/2021   Stage 3a chronic kidney disease ([place]) 9/74/1638   Systolic dysfunction    Type 2 diabetes mellitus 10/12/2019   Past Surgical History:  Procedure Laterality Date   AMPUTATION Left 07/23/2021   Procedure: LEFT BELOW KNEE AMPUTATION;  Surgeon: [name], MD;  Location: [place];  Service: Orthopedics;  Laterality: Left;   BELOW KNEE LEG AMPUTATION Right    BUBBLE STUDY  07/29/2021   Procedure: BUBBLE STUDY;  Surgeon: Sueanne Margarita, MD;  Location: [place];  Service: Cardiovascular;;   CARDIOVERSION N/A 07/29/2021   Procedure: CARDIOVERSION;  Surgeon: Sueanne Margarita, MD;  Location: [facility] ENDOSCOPY;  Service: Cardiovascular;  Laterality: N/A;   RADIOLOGY WITH ANESTHESIA N/A 08/05/2021   Procedure: MRI LUMBAR WITH AND WITHOUT; THORASIC SPINE WITH AND WITHOUT WITH ANESTHESIA;  Surgeon: Radiologist, Medication, MD;  Location: [place];  Service: Radiology;  Laterality: N/A;   RADIOLOGY WITH ANESTHESIA N/A 08/07/2021   Procedure: MRI WITH LUMBER WITH AND WITHOUT CONTRAST,THORACIC WITH AND WITHOUT CONTRAST;  Surgeon: Radiologist, Medication, MD;  Location: [place];  Service: Radiology;  Laterality: N/A;   RADIOLOGY WITH ANESTHESIA N/A 09/13/2021   Procedure: MRI WITH ANESTHESIA;  Surgeon: Luanne Bras, MD;  Location: [place];  Service: Radiology;  Laterality: N/A;   TEE WITHOUT CARDIOVERSION N/A 07/29/2021   Procedure: TRANSESOPHAGEAL ECHOCARDIOGRAM (TEE);  Surgeon: Sueanne Margarita, MD;  Location: [facility] ENDOSCOPY;  Service: Cardiovascular;  Laterality: N/A;   Family History  Problem Relation Age of Onset   Anxiety disorder Mother    Cancer Father    Coronary artery disease Father    Social History   Socioeconomic History   Marital status: Divorced    Spouse name: Not on file   Number of children: Not on file   Years of education: Not on file   Highest education level: Not on file  Occupational History   Not on file   Tobacco Use   Smoking status: Former    Types: Cigarettes   Smokeless tobacco: Never  Vaping Use   Vaping Use: Never used  Substance and Sexual Activity   Alcohol use: Not Currently   Drug use: Never   Sexual activity: Not Currently  Other Topics Concern   Not on file  Social History Narrative   Not on file   Social Determinants of Health   Financial Resource Strain: Low Risk  (04/06/2022)   Overall Financial Resource Strain (CARDIA)    Difficulty of Paying Living Expenses: Not hard at all  Food Insecurity: No Food Insecurity (04/06/2022)   Hunger Vital Sign    Worried About Running Out of Food in the Last Year: Never true    [place] in the Last Year: Never true  Transportation Needs: No Transportation Needs (04/06/2022)   PRAPARE - Transportation    Lack of Transportation (Medical): No    Lack of  Transportation (Non-Medical): No  Physical Activity: Insufficiently Active (04/06/2022)   Exercise Vital Sign    Days of Exercise per Week: 2 days    Minutes of Exercise per Session: 50 min  Stress: No Stress Concern Present (04/06/2022)   [place]    Feeling of Stress : Not at all  Social Connections: Socially Isolated (04/06/2022)   Social Connection and Isolation Panel [NHANES]    Frequency of Communication with Friends and Family: More than three times a week    Frequency of Social Gatherings with Friends and Family: Once a week    Attends Religious Services: Never    Marine scientist or Organizations: No    Attends Music therapist: Never    Marital Status: Divorced    Tobacco Counseling Counseling given: Not Answered   Clinical Intake:  Pre-visit preparation completed: Yes  Pain : No/denies pain     BMI - recorded: 31.63 Nutritional Status: BMI > 30  Obese Nutritional Risks: None Diabetes: Yes CBG done?: Yes (165 per pt stated) CBG resulted in Enter/ Edit results?:  No Did pt. bring in CBG monitor from home?: No  How often do you need to have someone help you when you read instructions, pamphlets, or other written materials from your doctor or pharmacy?: 1 - Never  Diabetic?Nutrition Risk Assessment:  Has the patient had any N/V/D within the last 2 months?  No  Does the patient have any non-healing wounds?  no Has the patient had any unintentional weight loss or weight gain?  Yes   Diabetes:  Is the patient diabetic?  Yes  If diabetic, was a CBG obtained today?  Yes  Did the patient bring in their glucometer from home?  No  How often do you monitor your CBG's? DAILY .   Financial Strains and Diabetes Management:  Are you having any financial strains with the device, your supplies or your medication? No .  Does the patient want to be seen by Chronic Care Management for management of their diabetes?  No  Would the patient like to be referred to a Nutritionist or for Diabetic Management?  No   Diabetic Exams:  Diabetic Eye Exam: Overdue for diabetic eye exam. Pt has been advised about the importance in completing this exam. Patient advised to call and schedule an eye exam.    Interpreter Needed?: No  Information entered by :: Charlott Rakes, LPN   Activities of Daily Living    04/06/2022    2:11 PM 12/29/2021    6:00 PM  In your present state of health, do you have any difficulty performing the following activities:  Hearing? 0 0  Vision? 0 0  Difficulty concentrating or making decisions? 0 0  Walking or climbing stairs? 0 1  Dressing or bathing? 1 1  Comment has assistance   Doing errands, shopping? 0 1  Preparing Food and eating ? Y   Comment has assisatnce   Using the Toilet? N   In the past six months, have you accidently leaked urine? N   Do you have problems with loss of bowel control? N   Managing your Medications? N   Managing your Finances? N   Housekeeping or managing your Housekeeping? N     Patient Care  Team: [name], NP as PCP - General (Family Medicine) Croitoru, Dani Gobble, MD as PCP - Cardiology (Cardiology)  Indicate any recent Medical Services you may have received from other  than [facility] providers in the past year (date may be approximate).     Assessment:   This is a routine wellness examination for [place].  Hearing/Vision screen Hearing Screening - Comments:: Pt denies any hearing issues  Vision Screening - Comments:: Pt follows [facility] with [facility] for [facility]ul eye exam  Dietary issues and exercise activities discussed: Current Exercise Habits: Structured exercise class, Type of exercise: Other - see comments (PT), Time (Minutes): 50, Frequency (Times/Week): 2, Weekly Exercise (Minutes/Week): 100   Goals Addressed             This Visit's Progress    Patient Stated       None at this time        Depression Screen    04/06/2022    2:07 PM 12/05/2021   10:31 AM 09/25/2019    3:58 PM 09/25/2019    3:15 PM  PHQ 2/9 Scores  PHQ - 2 Score _0 PHQ- 9 Score _1 Fall Risk    04/06/2022    2:10 PM 12/05/2021   10:31 AM 09/25/2019    3:15 PM  Fall Risk   Falls in the past year? 0 0 0  Number falls in past yr: 0 0   Injury with Fall? 0 0   Risk for fall due to : Impaired vision;Impaired mobility No Fall Risks   Follow up Falls prevention discussed Falls evaluation completed     FALL RISK PREVENTION PERTAINING TO THE HOME:  Any stairs in or around the home? No  If so, are there any without handrails? No  Home free of loose throw rugs in walkways, pet beds, electrical cords, etc? Yes  Adequate lighting in your home to reduce risk of falls? Yes   ASSISTIVE DEVICES UTILIZED TO PREVENT FALLS:  Life alert? No  Use of a cane, walker or w/c? Yes  Grab bars in the bathroom? Yes  Shower chair or bench in shower? Yes  Elevated toilet seat or a handicapped toilet? Yes   TIMED UP AND GO:  Was the test performed? No .   Cognitive Function:         04/06/2022    2:17 PM  6CIT Screen  What Year? 0 points  What month? 0 points  What time? 0 points  Count back from 20 0 points  Months in reverse 4 points  Repeat phrase 6 points  Total Score 10 points    Immunizations Immunization History  Administered Date(s) Administered   Influenza,inj,Quad PF,6+ Mos 09/25/2019, 07/28/2021   Tdap 09/25/2019    TDAP status: Up to date  Flu Vaccine status: Up to date  Pneumococcal vaccine status: Due, Education has been provided regarding the importance of this vaccine. Advised may receive this vaccine at local pharmacy or Health Dept. Aware to provide a copy of the vaccination record if obtained from local pharmacy or Health Dept. Verbalized acceptance and understanding.  Covid-19 vaccine status: Declined, Education has been provided regarding the importance of this vaccine but patient still declined. Advised may receive this vaccine at local pharmacy or Health Dept.or vaccine clinic. Aware to provide a copy of the vaccination record if obtained from local pharmacy or Health Dept. Verbalized acceptance and understanding.  Qualifies for Shingles Vaccine? Yes   Zostavax completed No   Shingrix Completed?: No.    Education has been provided regarding the importance of this vaccine. Patient has been advised to call insurance company to determine out of  pocket expense if they have not yet received this vaccine. Advised may also receive vaccine at local pharmacy or Health Dept. Verbalized acceptance and understanding.  Screening Tests Health Maintenance  Topic Date Due   COVID-19 Vaccine (1) Never done   OPHTHALMOLOGY EXAM  Never done   URINE MICROALBUMIN  Never done   Zoster Vaccines- Shingrix (1 of 2) Never done   COLONOSCOPY (Pts 45-17yr Insurance coverage will need to be confirmed)  04/07/2023 (Originally 01/20/2011)   INFLUENZA VACCINE  04/07/2022   HEMOGLOBIN A1C  06/06/2022   TETANUS/TDAP  09/24/2029   Hepatitis C Screening  Completed    HIV Screening  Completed   HPV VACCINES  Aged Out   FOOT EXAM  Discontinued    Health Maintenance  Health Maintenance Due  Topic Date Due   COVID-19 Vaccine (1) Never done   OPHTHALMOLOGY EXAM  Never done   URINE MICROALBUMIN  Never done   Zoster Vaccines- Shingrix (1 of 2) Never done    Colonoscopy postponed at this time    Additional Screening:  Hepatitis C Screening: Completed 07/24/21  Vision Screening: Recommended annual ophthalmology exams for early detection of glaucoma and other disorders of the eye. Is the patient up to date with their annual eye exam?  No  Who is the provider or what is the name of the office in which the patient attends annual eye exams? [facility] If pt is not established with a provider, would they like to be referred to a provider to establish care? No .   Dental Screening: Recommended annual dental exams for proper oral hygiene  Community Resource Referral / Chronic Care Management: CRR required this visit?  No   CCM required this visit?  No      Plan:     I have personally reviewed and noted the following in the patient's chart:   Medical and social history Use of alcohol, tobacco or illicit drugs  Current medications and supplements including opioid prescriptions. Patient is currently taking opioid prescriptions. Information provided to patient regarding non-opioid alternatives. Patient advised to discuss non-opioid treatment plan with their provider. Functional ability and status Nutritional status Physical activity Advanced directives List of other physicians Hospitalizations, surgeries, and ER visits in previous 12 months Vitals Screenings to include cognitive, depression, and falls Referrals and appointments  In addition, I have reviewed and discussed with patient certain preventive protocols, quality metrics, and best practice recommendations. A written personalized care plan for preventive services as well as general  preventive health recommendations were provided to patient.     TWillette Brace LPN   75/63/[id number]  Nurse Notes: Pt is requesting Diflucan to be called into RX stating he has a yeast area on his sacral, I also encouraged him to reach out to his wound care provider as well please advise?

## 2022-04-06 NOTE — Telephone Encounter (Signed)
Call [facility] office directly and let them know above and ask if they don't handle this, as they are the ones ordering the supplies. I would think they are using the appropriate dx codes so have no idea why it would be refused. Let me know what they say, thx

## 2022-04-06 NOTE — Telephone Encounter (Signed)
[name] and [name] with [name] stated that a third party [name] told [name] that [name] is not going to receive his [name] supplies for [place] due to supplies being denied unless there is an appeal. [name] states he has never heard of [name] and requests they be called at the ph# listed below.  [name] [name] ph# [phone] Reference# [id number]  [name] at [facility] requests an authorization for [name]'s [name] supplies be faxed to [facility] at Fax# [phone]

## 2022-04-06 NOTE — Patient Instructions (Addendum)
[name] , Thank you for taking time to come for your Medicare Wellness Visit. I appreciate your ongoing commitment to your health goals. Please review the following plan we discussed and let me know if I can assist you in the future.   Screening recommendations/referrals: Colonoscopy: postponed at this time  Recommended yearly ophthalmology/optometry visit for glaucoma screening and checkup Recommended yearly dental visit for hygiene and checkup  Vaccinations: Influenza vaccine: done 07/28/21 repeat every year  Pneumococcal vaccine: due  Tdap vaccine: done 09/25/19 repeat every 10 years  Shingles vaccine: Shingrix discussed. Please contact your pharmacy for coverage information.    Covid-19: declined   Advanced directives: Advance directive discussed with you today. Even though you declined this today please call our office should you change your mind and we can give you the proper paperwork for you to fill out.  Conditions/risks identified: none at this time   Next appointment: Follow up in one year for your annual wellness visit   Preventive Care 40-64 Years, Male Preventive care refers to lifestyle choices and visits with your health care provider that can promote health and wellness. What does preventive care include? A yearly physical exam. This is also called an annual well check. Dental exams once or twice a year. Routine eye exams. Ask your health care provider how often you should have your eyes checked. Personal lifestyle choices, including: Daily care of your teeth and gums. Regular physical activity. Eating a healthy diet. Avoiding tobacco and drug use. Limiting alcohol use. Practicing safe sex. Taking low-dose aspirin every day starting at age 54. What happens during an annual well check? The services and screenings done by your health care provider during your annual well check will depend on your age, overall health, lifestyle risk factors, and family history of  disease. Counseling  Your health care provider may ask you questions about your: Alcohol use. Tobacco use. Drug use. Emotional well-being. Home and relationship well-being. Sexual activity. Eating habits. Work and work Statistician. Screening  You may have the following tests or measurements: Height, weight, and BMI. Blood pressure. Lipid and cholesterol levels. These may be checked every 5 years, or more frequently if you are over 106 years old. Skin check. Lung cancer screening. You may have this screening every year starting at age 86 if you have a 30-pack-year history of smoking and currently smoke or have quit within the past 15 years. Fecal occult blood test (FOBT) of the stool. You may have this test every year starting at age 31. Flexible sigmoidoscopy or colonoscopy. You may have a sigmoidoscopy every 5 years or a colonoscopy every 10 years starting at age 63. Prostate cancer screening. Recommendations will vary depending on your family history and other risks. Hepatitis C blood test. Hepatitis B blood test. Sexually transmitted disease (STD) testing. Diabetes screening. This is done by checking your blood sugar (glucose) after you have not eaten for a while (fasting). You may have this done every 1-3 years. Discuss your test results, treatment options, and if necessary, the need for more tests with your health care provider. Vaccines  Your health care provider may recommend certain vaccines, such as: Influenza vaccine. This is recommended every year. Tetanus, diphtheria, and acellular pertussis (Tdap, Td) vaccine. You may need a Td booster every 10 years. Zoster vaccine. You may need this after age 70. Pneumococcal 13-valent conjugate (PCV13) vaccine. You may need this if you have certain conditions and have not been vaccinated. Pneumococcal polysaccharide (PPSV23) vaccine. You may need  one or two doses if you smoke cigarettes or if you have certain conditions. Talk to your  health care provider about which screenings and vaccines you need and how often you need them. This information is not intended to replace advice given to you by your health care provider. Make sure you discuss any questions you have with your health care provider. Document Released: 09/20/2015 Document Revised: 05/13/2016 Document Reviewed: 06/25/2015 Elsevier Interactive Patient Education  2017 [place] Prevention in the Home Falls can cause injuries. They can happen to people of all ages. There are many things you can do to make your home safe and to help prevent falls. What can I do on the outside of my home? Regularly fix the edges of walkways and driveways and fix any cracks. Remove anything that might make you trip as you walk through a door, such as a raised step or threshold. Trim any bushes or trees on the path to your home. Use bright outdoor lighting. Clear any walking paths of anything that might make someone trip, such as rocks or tools. Regularly check to see if handrails are loose or broken. Make sure that both sides of any steps have handrails. Any raised decks and porches should have guardrails on the edges. Have any leaves, snow, or ice cleared regularly. Use sand or salt on walking paths during winter. Clean up any spills in your garage right away. This includes oil or grease spills. What can I do in the bathroom? Use night lights. Install grab bars by the toilet and in the tub and shower. Do not use towel bars as grab bars. Use non-skid mats or decals in the tub or shower. If you need to sit down in the shower, use a plastic, non-slip stool. Keep the floor dry. Clean up any water that spills on the floor as soon as it happens. Remove soap buildup in the tub or shower regularly. Attach bath mats securely with double-sided non-slip rug tape. Do not have throw rugs and other things on the floor that can make you trip. What can I do in the bedroom? Use night  lights. Make sure that you have a light by your bed that is easy to reach. Do not use any sheets or blankets that are too big for your bed. They should not hang down onto the floor. Have a firm chair that has side arms. You can use this for support while you get dressed. Do not have throw rugs and other things on the floor that can make you trip. What can I do in the kitchen? Clean up any spills right away. Avoid walking on wet floors. Keep items that you use a lot in easy-to-reach places. If you need to reach something above you, use a strong step stool that has a grab bar. Keep electrical cords out of the way. Do not use floor polish or wax that makes floors slippery. If you must use wax, use non-skid floor wax. Do not have throw rugs and other things on the floor that can make you trip. What can I do with my stairs? Do not leave any items on the stairs. Make sure that there are handrails on both sides of the stairs and use them. Fix handrails that are broken or loose. Make sure that handrails are as long as the stairways. Check any carpeting to make sure that it is firmly attached to the stairs. Fix any carpet that is loose or worn. Avoid having throw  rugs at the top or bottom of the stairs. If you do have throw rugs, attach them to the floor with carpet tape. Make sure that you have a light switch at the top of the stairs and the bottom of the stairs. If you do not have them, ask someone to add them for you. What else can I do to help prevent falls? Wear shoes that: Do not have high heels. Have rubber bottoms. Are comfortable and fit you well. Are closed at the toe. Do not wear sandals. If you use a stepladder: Make sure that it is fully opened. Do not climb a closed stepladder. Make sure that both sides of the stepladder are locked into place. Ask someone to hold it for you, if possible. Clearly mark and make sure that you can see: Any grab bars or handrails. First and last  steps. Where the edge of each step is. Use tools that help you move around (mobility aids) if they are needed. These include: Canes. Walkers. Scooters. Crutches. Turn on the lights when you go into a dark area. Replace any light bulbs as soon as they burn out. Set up your furniture so you have a clear path. Avoid moving your furniture around. If any of your floors are uneven, fix them. If there are any pets around you, be aware of where they are. Review your medicines with your doctor. Some medicines can make you feel dizzy. This can increase your chance of falling. Ask your doctor what other things that you can do to help prevent falls. This information is not intended to replace advice given to you by your health care provider. Make sure you discuss any questions you have with your health care provider. Document Released: 06/20/2009 Document Revised: 01/30/2016 Document Reviewed: 09/28/2014 Elsevier Interactive Patient Education  2017 [employer].

## 2022-04-07 NOTE — Telephone Encounter (Signed)
LVM for [facility] nurse in regards.

## 2022-04-08 ENCOUNTER — Telehealth (INDEPENDENT_AMBULATORY_CARE_PROVIDER_SITE_OTHER): Payer: Medicare HMO | Admitting: Family

## 2022-04-08 DIAGNOSIS — N390 Urinary tract infection, site not specified: Secondary | ICD-10-CM

## 2022-04-08 DIAGNOSIS — Z87448 Personal history of other diseases of urinary system: Secondary | ICD-10-CM

## 2022-04-08 DIAGNOSIS — N3000 Acute cystitis without hematuria: Secondary | ICD-10-CM

## 2022-04-08 NOTE — Telephone Encounter (Signed)
Caller states:  - Patient finished round of Cipro last week but wants another UA and urine culture ordered.  - Patient is determined to have caller draw labs to check on his kidney function such as a CBC and CMP.  - There is some sediment in patient's urine but his catheter has not been changed since 03/23/22. States his catheter is not set to be changed in a couple of weeks.   Please Advise.

## 2022-04-09 ENCOUNTER — Ambulatory Visit (HOSPITAL_COMMUNITY)
Admission: EM | Admit: 2022-04-09 | Discharge: 2022-04-09 | Disposition: A | Discharge status: Home / Self Care | Payer: Medicare HMO | Attending: Internal Medicine | Admitting: Internal Medicine

## 2022-04-09 ENCOUNTER — Encounter (HOSPITAL_COMMUNITY): Payer: Self-pay

## 2022-04-09 DIAGNOSIS — T83511A Infection and inflammatory reaction due to indwelling urethral catheter, initial encounter: Secondary | ICD-10-CM | POA: Insufficient documentation

## 2022-04-09 DIAGNOSIS — N390 Urinary tract infection, site not specified: Secondary | ICD-10-CM

## 2022-04-09 LAB — POCT URINALYSIS DIPSTICK, ED / UC
Bilirubin Urine: NEGATIVE
Glucose, UA: >=1000 mg/dL — AB
Nitrite: POSITIVE — AB
Protein, ur: 100 mg/dL — AB
Specific Gravity, Urine: 1.02 (ref 1.005–1.030)
Urobilinogen, UA: 0.2 mg/dL (ref 0.0–1.0)
pH: 5 (ref 5.0–8.0)

## 2022-04-09 MED ORDER — CEFTRIAXONE SODIUM 1 G IJ SOLR
1.0000 g | Freq: Once | INTRAMUSCULAR | Status: AC
  Administered 2022-04-09: 1 g via INTRAMUSCULAR

## 2022-04-09 MED ORDER — CEFDINIR 300 MG PO CAPS: 300.0000 mg | ORAL_CAPSULE | Freq: Two times a day (BID) | ORAL | 0 refills | Status: DC

## 2022-04-09 MED ORDER — CEFTRIAXONE SODIUM 1 G IJ SOLR
INTRAMUSCULAR | Status: AC
  Filled 2022-04-09: qty 10

## 2022-04-09 NOTE — ED Triage Notes (Signed)
Pt states has had a foley cath 1/23, this is his 2nd UTI in a month. States finished his antibiotics last Wednesday. States having lower back pain, lt flank pain, and settlement in urine bag

## 2022-04-09 NOTE — ED Provider Notes (Addendum)
[facility]    CSN: [id number] Arrival date & time: 04/09/22  1902      History   Chief Complaint Chief Complaint  Patient presents with   Recurrent UTI    HPI [name] is a 56 y.o. male with multiple medical problems presents to urgent care today with complaints of low back pain and overall feeling of malaise.  Also reports increased sediment in Foley bag with chronic indwelling catheter.  Patient reports similar symptoms with past UTIs.  Finished Cipro 1 week ago for UTI.  He denies any recent fever, chills, abdominal pain, N/V.   Past Medical History:  Diagnosis Date   Acquired complex renal cyst 09/25/2019   AKI (acute kidney injury) ([place]) 07/22/2021   Chest pain 23/30/0762   Complication of anesthesia    Decubitus ulcer of sacral area 12/15/2021   Depression    Diabetes mellitus without complication ([place])    Healthcare maintenance 09/25/2019   Hematuria 09/25/2019   Lactic acidosis 12/15/2021   Leukocytosis 09/13/2021   Sacral pressure injury of skin 07/27/2021   Septic shock ([place]) 07/22/2021   Severe sepsis with acute organ dysfunction ([place]) 07/22/2021   Shortness of breath 05/16/2021   Stage 3a chronic kidney disease ([place]) 2/63/3354   Systolic dysfunction    Type 2 diabetes mellitus 10/12/2019    Patient Active Problem List   Diagnosis Date Noted   Generalized anxiety disorder 01/21/2022   Infected ulcer of skin ([place]) 12/29/2021   Episode of unresponsiveness 12/15/2021   Acute respiratory failure with hypoxia (HCC) secondary to suspected aspiration pneumonia 12/15/2021   Renal mass 56/25/6389   Complicated UTI (urinary tract infection) 12/15/2021   Stage IV pressure ulcer of sacral region ([place]) 09/15/2021   Insomnia 37/34/2876   Chronic systolic CHF (congestive heart failure) ([place]) 09/14/2021   Hypogonadism in male 09/14/2021   Chronic pain 09/14/2021   Sacral pain 09/13/2021   Pseudohyponatremia 09/13/2021   Normocytic anemia 09/13/2021    Discitis of thoracic region    Recent bacteremia due to group B Streptococcus, Streptococcus agalactiae, thoracic discitis/osteomyelitis with epidural abscess    Gas gangrene of foot ([place]) 07/22/2021   Paroxysmal atrial flutter (HCC) 07/22/2021   Chronic diastolic (congestive) heart failure ([place]) 05/16/2021   Diabetic infection of left foot ([place]) 04/17/2021   S/P bilateral BKA (below knee amputation) ([place]) 10/30/2019   Dyslipidemia 10/30/2019   Type 2 diabetes mellitus with diabetic polyneuropathy, with long-term current use of insulin ([place]) 10/12/2019   Essential hypertension 09/25/2019   Type 2 diabetes mellitus with hyperglycemia, with long-term current use of insulin ([place]) 09/25/2019   Urinary hesitancy 09/25/2019   Prosthesis adjustments 09/25/2019   Erectile dysfunction 09/25/2019   Pulmonary nodules 09/25/2019   Depression with anxiety 09/25/2019    Past Surgical History:  Procedure Laterality Date   AMPUTATION Left 07/23/2021   Procedure: LEFT BELOW KNEE AMPUTATION;  Surgeon: [name], MD;  Location: [facility];  Service: Orthopedics;  Laterality: Left;   BELOW KNEE LEG AMPUTATION Right    BUBBLE STUDY  07/29/2021   Procedure: BUBBLE STUDY;  Surgeon: Sueanne Margarita, MD;  Location: [facility];  Service: Cardiovascular;;   CARDIOVERSION N/A 07/29/2021   Procedure: CARDIOVERSION;  Surgeon: Sueanne Margarita, MD;  Location: [facility] ENDOSCOPY;  Service: Cardiovascular;  Laterality: N/A;   RADIOLOGY WITH ANESTHESIA N/A 08/05/2021   Procedure: MRI LUMBAR WITH AND WITHOUT; THORASIC SPINE WITH AND WITHOUT WITH ANESTHESIA;  Surgeon: Radiologist, Medication, MD;  Location: [facility];  Service:  Radiology;  Laterality: N/A;   RADIOLOGY WITH ANESTHESIA N/A 08/07/2021   Procedure: MRI WITH LUMBER WITH AND WITHOUT CONTRAST,THORACIC WITH AND WITHOUT CONTRAST;  Surgeon: Radiologist, Medication, MD;  Location: [facility];  Service: Radiology;  Laterality: N/A;   RADIOLOGY WITH ANESTHESIA N/A 09/13/2021    Procedure: MRI WITH ANESTHESIA;  Surgeon: Luanne Bras, MD;  Location: [facility];  Service: Radiology;  Laterality: N/A;   TEE WITHOUT CARDIOVERSION N/A 07/29/2021   Procedure: TRANSESOPHAGEAL ECHOCARDIOGRAM (TEE);  Surgeon: Sueanne Margarita, MD;  Location: [facility] ENDOSCOPY;  Service: Cardiovascular;  Laterality: N/A;       Home Medications    Prior to Admission medications   Medication Sig Start Date End Date Taking? Authorizing Provider  cefdinir (OMNICEF) 300 MG capsule Take 1 capsule (300 mg total) by mouth 2 (two) times daily for 7 days. 04/09/22 04/16/22 Yes Rudolpho Sevin, NP  blood glucose meter kit and supplies KIT Dispense based on patient and insurance preference. Use up to four times daily as directed. 12/05/21   [place], Jake Church I, NP  busPIRone (BUSPAR) 5 MG tablet Take 1-2 tablets (5-10 mg total) by mouth 3 (three) times daily. 01/21/22   [name], NP  furosemide (LASIX) 20 MG tablet Take 1 tablet (20 mg total) by mouth daily. 03/25/22   Croitoru, Mihai, MD  insulin NPH-regular Human (NOVOLIN 70/30 RELION) (70-30) 100 UNIT/ML injection Inject 15 Units into the skin 2 (two) times daily with a meal. 12/18/21   Barb Merino, MD  Insulin Pen Needle 32G X 4 MM MISC Use as directed 09/17/21   Hongalgi, Lenis Dickinson, MD  insulin regular (NOVOLIN R) 100 units/mL injection Inject 0-20 Units into the skin 4 (four) times daily as needed for high blood sugar.    [provider]  lisinopril (ZESTRIL) 10 MG tablet Take 10 mg by mouth daily. 02/27/22   [provider]  Omega-3 Fatty Acids (FISH OIL PO) Take by mouth.    [provider]  Oxycodone HCl 10 MG TABS Take 10 mg by mouth 3 (three) times daily as needed. 04/03/22   [provider]  protein supplement shake (PREMIER PROTEIN) LIQD Take 2 oz by mouth 3 (three) times daily with meals.    [provider]  testosterone cypionate (DEPOTESTOSTERONE CYPIONATE) 200 MG/ML injection Inject 1 mL (200 mg  total) into the muscle every 14 (fourteen) days. 03/24/22   [name], NP  zolpidem (AMBIEN) 10 MG tablet TAKE 1 TABLET BY MOUTH AT BEDTIME AS NEEDED FOR  SLEEP.  TAKE  1  HOUR  PRIOR  TO  BEDTIME. 03/26/22   [name], NP    Family History Family History  Problem Relation Age of Onset   Anxiety disorder Mother    Cancer Father    Coronary artery disease Father     Social History Social History   Tobacco Use   Smoking status: Former    Types: Cigarettes   Smokeless tobacco: Never  Vaping Use   Vaping Use: Never used  Substance Use Topics   Alcohol use: Not Currently   Drug use: Never     Allergies   Bactrim [sulfamethoxazole-trimethoprim], Ceprotin [protein c concentrate (human)], Ciprofloxacin, Insulin glargine, and Levaquin [levofloxacin]   Review of Systems As stated in HPI otherwise negative   Physical Exam Triage Vital Signs ED Triage Vitals [04/09/22 1922]  Enc Vitals Group     BP (!) 172/98     Pulse Rate 77     Resp 18  Temp 98.1 F (36.7 C)     Temp Source Oral     SpO2 99 %     Weight      Height      Head Circumference      Peak Flow      Pain Score 8     Pain Loc      Pain Edu?      Excl. in [place]?    No data found.  Updated Vital Signs BP (!) 172/98 (BP Location: Left Arm)   Pulse 77   Temp 98.1 F (36.7 C) (Oral)   Resp 18   SpO2 99%   Visual Acuity Right Eye Distance:   Left Eye Distance:   Bilateral Distance:    Right Eye Near:   Left Eye Near:    Bilateral Near:     Physical Exam Constitutional:      General: He is not in acute distress.    Appearance: Normal appearance. He is not ill-appearing or toxic-appearing.  Abdominal:     General: There is no distension.     Palpations: Abdomen is soft.     Tenderness: There is no abdominal tenderness. There is no right CVA tenderness, left CVA tenderness or guarding.  Skin:    General: Skin is warm and dry.  Neurological:     General: No focal deficit  present.     Mental Status: He is alert and oriented to person, place, and time.  Psychiatric:        Mood and Affect: Mood normal.        Behavior: Behavior normal.      UC Treatments / Results  Labs (all labs ordered are listed, but only abnormal results are displayed) Labs Reviewed  POCT URINALYSIS DIPSTICK, ED / UC - Abnormal; Notable for the following components:      Result Value   Glucose, UA >=1000 (*)    Ketones, ur TRACE (*)    Hgb urine dipstick SMALL (*)    Protein, ur 100 (*)    Nitrite POSITIVE (*)    Leukocytes,Ua SMALL (*)    All other components within normal limits  URINE CULTURE    EKG   Radiology No results found.  Procedures Procedures (including critical care time)  Medications Ordered in UC Medications  cefTRIAXone (ROCEPHIN) injection 1 g (1 g Intramuscular Given 04/09/22 2021)    Initial Impression / Assessment and Plan / UC Course  I have reviewed the triage vital signs and the nursing notes.  Pertinent labs & imaging results that were available during my care of the patient were reviewed by me and considered in my medical decision making (see chart for details).  UTI -Recurrent UTIs in setting of chronic indwelling catheter.  Completed course of Cipro 1 week ago -UA today positive for nitrites which was not the case for urinalysis done in April and January of this year so suspect acute infection over chronic colonization -Patient appears well and nontoxic, VSS.  I do not see that a urine culture was obtained with most recent UTI.  We will send that off today.  He reports allergy to Bactrim and Levaquin -Empiric IM Rocephin in clinic.  Will discharge on Omnicef twice daily and follow-up culture results -Patient instructed he will need to go to the emergency department should he develop any worsening symptoms or fever  Reviewed expections re: course of current medical issues. Questions answered. Outlined signs and symptoms indicating need for  more acute intervention.  Pt verbalized understanding. AVS given  Final Clinical Impressions(s) / UC Diagnoses   Final diagnoses:  Urinary tract infection associated with indwelling urethral catheter, initial encounter [facility])     Discharge Instructions      You have been given a shot of Rocephin today in the clinic. I have sent your urine to be culture and we will reach out if a different antibiotic.  Meantime I will also start you on Omnicef.  Please take twice daily until gone.  As we discussed, you will need to go directly to the ER should you develop any worsening pain or fever.     ED Prescriptions     Medication Sig Dispense Auth. Provider   cefdinir (OMNICEF) 300 MG capsule Take 1 capsule (300 mg total) by mouth 2 (two) times daily for 7 days. 14 capsule Rudolpho Sevin, NP      PDMP not reviewed this encounter.   Rudolpho Sevin, NP 04/09/22 2036    Rudolpho Sevin, NP 04/09/22 2036

## 2022-04-09 NOTE — Discharge Instructions (Addendum)
You have been given a shot of Rocephin today in the clinic. I have sent your urine to be culture and we will reach out if a different antibiotic.  Meantime I will also start you on Omnicef.  Please take twice daily until gone.  As we discussed, you will need to go directly to the ER should you develop any worsening pain or fever.

## 2022-04-09 NOTE — Telephone Encounter (Signed)
Entered orders and [employer]. Please print orders and fax over to [facility]. Thanks!

## 2022-04-09 NOTE — Telephone Encounter (Signed)
Please see the MyChart message reply(ies) for my assessment and plan.  The patient gave consent for this Medical Advice Message and is aware that it may result in a bill to their insurance company as well as the possibility that this may result in a co-payment or deductible. They are an established patient, but are not seeking medical advice exclusively about a problem treated during an in person or video visit in the last 7 days. I did not recommend an in person or video visit within 7 days of my reply.  I spent a total of 15 minutes cumulative time within 7 days through MyChart messaging Sybil Shrader, NP  

## 2022-04-10 ENCOUNTER — Telehealth: Payer: Self-pay | Admitting: Family

## 2022-04-10 NOTE — Telephone Encounter (Signed)
Didn't fire him, just transferred him to someone better equipped to handle all of his medical issues. Again, BMP still needed, but not the UA or culture since done in ER, they did not do any blood work. Thx

## 2022-04-10 NOTE — Telephone Encounter (Signed)
Did orders get faxed to [facility] yet? cancel the urinalysis and culture as he was seen in ER and this was done, just needs the BMET, let me know, thx

## 2022-04-10 NOTE — Telephone Encounter (Signed)
Patient requests to be called at ph# [phone] for status of Orders being sent to [facility]. [facility] told Patient they did not receive orders as of 5:50 pm 04/09/22.  Patient states he went to Urgent Care last night (04/09/22) for UTI. Patient states he is allergic to Cipro and that Urgent Care gave him a shot.

## 2022-04-10 NOTE — Telephone Encounter (Signed)
[name] with [name] called back. She is leaving the office soon. She states she did not draw any labs on pt. Pt stated to her that he went to urgent care last night. He also told her that [name] fired him as a pt.

## 2022-04-11 LAB — URINE CULTURE: Culture: >=100000 — AB

## 2022-04-13 ENCOUNTER — Telehealth (HOSPITAL_COMMUNITY): Payer: Self-pay | Admitting: Emergency Medicine

## 2022-04-13 MED ORDER — CIPROFLOXACIN HCL 500 MG PO TABS: 500.0000 mg | ORAL_TABLET | Freq: Two times a day (BID) | ORAL | 0 refills | Status: AC

## 2022-04-13 NOTE — Progress Notes (Unsigned)
Office Visit    Patient Name: [name] Date of Encounter: 04/14/2022  PCP:  [name] Sewer, NP   [place]  Cardiologist:  Sanda Klein, MD  Advanced Practice Provider:  No care team member to display Electrophysiologist:  None   Chief Complaint    [name] is a 56 y.o. 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 Lamonte [name] after chest CT disclosed the presence of multivessel CAD and cardiomegaly Presents today for follow-up visit.  He was last seen 05/16/2021 and was describing symptoms consistent with CHF.  He was having exertional dyspnea and swelling in his amputation stump.  His BNP was elevated at 235.  His echocardiogram showed borderline LV function with EF 50 to 55% but there was echo evidence of elevated left atrial filling pressures, the left atrium was severely dilated, there was no meaningful valvular abnormalities.  Patient improved after treatment with diuretics.  Creatinine increased from 1.57-1.81.  We will try decreasing his dose of furosemide but his shortness of breath and swelling at his stump have increased again.  He denies any chest pain.  Noncontrast CT performed 04/2021 showed cardiomegaly, three-vessel CAD and coarsened nodular liver contour suggestive of cirrhosis.  He is skeptical of any medications in general.  His father had bypass surgery at age 23 and later went on to undergo a redo bypass surgery.  Patient made it clear that he will never undergo major surgery.  He is being monitored for a mass in his right kidney by his urologist, Dr. Louis [name] suspects to represent renal cell carcinoma.  He does not want any surgery for this condition.  Today, he tells me that he would like to set the record straight and that he did not have  osteomyelitis with his original amputation.  He believes that this was a medical error.  His most recent amputation was done November 2022 on the left done by Dr. Sharol [name].  He has not had any chest pain.  He has had some shortness of breath with this and about the same.  He has a past history of syncope but he was told his oxygen was low at the time and this was the reason he passed out.  He has 2 prosthetic limbs in place today and he does not do too much activity.  He does move from the bed to the wheelchair and sometimes has shortness of breath with this movement.  He does take Lasix 20 mg daily.  He states that 40 mg daily was too much for him and he was losing a lot of weight.  He states his weight has been stable on 20 mg daily and he has not had any swelling or increased shortness of breath.  He is currently on Cipro antibiotic for a UTI.  He has a catheter in place today.  He is hoping once his sacral wound heals he will be able to get the catheter removed and get off of antibiotics.  From a heart standpoint, he has been doing well.  Reports no chest pain, pressure, or tightness. No edema, orthopnea, PND. Reports no palpitations.    Past Medical History    Past Medical History:  Diagnosis Date   Acquired complex renal cyst 09/25/2019   AKI (acute kidney injury) ([place]) 07/22/2021   Chest pain  54/62/7035   Complication of anesthesia    Decubitus ulcer of sacral area 12/15/2021   Depression    Diabetes mellitus without complication ([place])    Healthcare maintenance 09/25/2019   Hematuria 09/25/2019   Lactic acidosis 12/15/2021   Leukocytosis 09/13/2021   Sacral pressure injury of skin 07/27/2021   Septic shock ([place]) 07/22/2021   Severe sepsis with acute organ dysfunction ([place]) 07/22/2021   Shortness of breath 05/16/2021   Stage 3a chronic kidney disease ([place]) 0/05/3817   Systolic dysfunction    Type 2 diabetes mellitus 10/12/2019   Past Surgical History:  Procedure Laterality Date   AMPUTATION Left  07/23/2021   Procedure: LEFT BELOW KNEE AMPUTATION;  Surgeon: [name] Minion, MD;  Location: [place];  Service: Orthopedics;  Laterality: Left;   BELOW KNEE LEG AMPUTATION Right    BUBBLE STUDY  07/29/2021   Procedure: BUBBLE STUDY;  Surgeon: [name] Margarita, MD;  Location: [place];  Service: Cardiovascular;;   CARDIOVERSION N/A 07/29/2021   Procedure: CARDIOVERSION;  Surgeon: [name] Margarita, MD;  Location: [facility] ENDOSCOPY;  Service: Cardiovascular;  Laterality: N/A;   RADIOLOGY WITH ANESTHESIA N/A 08/05/2021   Procedure: MRI LUMBAR WITH AND WITHOUT; THORASIC SPINE WITH AND WITHOUT WITH ANESTHESIA;  Surgeon: Radiologist, Medication, MD;  Location: [place];  Service: Radiology;  Laterality: N/A;   RADIOLOGY WITH ANESTHESIA N/A 08/07/2021   Procedure: MRI WITH LUMBER WITH AND WITHOUT CONTRAST,THORACIC WITH AND WITHOUT CONTRAST;  Surgeon: Radiologist, Medication, MD;  Location: [place];  Service: Radiology;  Laterality: N/A;   RADIOLOGY WITH ANESTHESIA N/A 09/13/2021   Procedure: MRI WITH ANESTHESIA;  Surgeon: [name] Bras, MD;  Location: [place];  Service: Radiology;  Laterality: N/A;   TEE WITHOUT CARDIOVERSION N/A 07/29/2021   Procedure: TRANSESOPHAGEAL ECHOCARDIOGRAM (TEE);  Surgeon: [name] Margarita, MD;  Location[facility] ENDOSCOPY;  Service: Cardiovascular;  Laterality: N/A;    Allergies  Allergies  Allergen Reactions   Bactrim [Sulfamethoxazole-Trimethoprim]    Ceprotin [Protein C Concentrate (Human)]    Ciprofloxacin Other (See Comments)    Kidney function Kidney function   Insulin Glargine Other (See Comments)    Causative Agent: Basaglar KwikPen U-100 Insulin;    Levaquin [Levofloxacin]     EKGs/Labs/Other Studies Reviewed:   The following studies were reviewed today:  Coronary CTA 05/23/2021  IMPRESSION: 1. Coronary calcium score of 243. This was 40 percentile for age and sex matched control.   2. Normal coronary origin with right dominance.   3. Diffuse calcified plaque of  LAD, RCA. There is significant stenosis of mid first diagonal branch, appears too small for PCI (FFR 0.64).   4.  Aortic atherosclerosis.  Echocardiogram 07/29/2021  IMPRESSIONS     1. Left ventricular ejection fraction, by estimation, is 35 to 40%. The  left ventricle has moderately decreased function. The left ventricle  demonstrates global hypokinesis.   2. Right ventricular systolic function is moderately reduced. The right  ventricular size is normal.   3. Left atrial size was mildly dilated. No left atrial/left atrial  appendage thrombus was detected.   4. Right atrial size was mildly dilated.   5. The mitral valve is normal in structure. Trivial mitral valve  regurgitation. No evidence of mitral stenosis.   6. The aortic valve is normal in structure. Aortic valve regurgitation is  trivial. No aortic stenosis is present.   7. The inferior vena cava is normal in size with greater than 50%  respiratory variability, suggesting right atrial pressure of 3 mmHg.  8. Cannot exclude a small PFO.   Conclusion(s)/Recommendation(s): Normal biventricular function without  evidence of hemodynamically significant valvular heart disease.   FINDINGS   Left Ventricle: Left ventricular ejection fraction, by estimation, is 35  to 40%. The left ventricle has moderately decreased function. The left  ventricle demonstrates global hypokinesis. The left ventricular internal  cavity size was normal in size.  There is no left ventricular hypertrophy.   Right Ventricle: The right ventricular size is normal. No increase in  right ventricular wall thickness. Right ventricular systolic function is  moderately reduced.   Left Atrium: Left atrial size was mildly dilated. No left atrial/left  atrial appendage thrombus was detected.   Right Atrium: Right atrial size was mildly dilated.   Pericardium: There is no evidence of pericardial effusion.   Mitral Valve: The mitral valve is normal in  structure. There is mild  calcification of the posterior mitral valve leaflet(s). Trivial mitral  valve regurgitation. No evidence of mitral valve stenosis.   Tricuspid Valve: The tricuspid valve is normal in structure. Tricuspid  valve regurgitation is mild . No evidence of tricuspid stenosis.   Aortic Valve: The aortic valve is normal in structure. Aortic valve  regurgitation is trivial. No aortic stenosis is present.   Pulmonic Valve: The pulmonic valve was normal in structure. Pulmonic valve  regurgitation is not visualized. No evidence of pulmonic stenosis.   Aorta: The aortic root is normal in size and structure.   Venous: The inferior vena cava is normal in size with greater than 50%  respiratory variability, suggesting right atrial pressure of 3 mmHg.   IAS/Shunts: There is redundancy of the interatrial septum. Cannot exclude  a small PFO. Agitated saline contrast was [name] intravenously to evaluate  for intracardiac shunting.   EKG:  EKG is not ordered today.   Recent Labs: 12/15/2021: B Natriuretic Peptide 70.0 12/18/2021: Magnesium 1.7 12/28/2021: ALT 22 12/31/2021: Hemoglobin 14.3; Platelets 253 03/20/2022: BUN 32; Creatinine 1.0; Potassium 4.6; Sodium 131  Recent Lipid Panel    Component Value Date/Time   CHOL 183 12/05/2021 1158   TRIG 127 12/05/2021 1158   HDL 39 (L) 12/05/2021 1158   CHOLHDL 4.7 12/05/2021 1158   LDLCALC 121 (H) 12/05/2021 1158    Home Medications   Current Meds  [name] Sig   blood glucose meter kit and supplies KIT Dispense based on patient and insurance preference. Use up to four times daily as directed.   ciprofloxacin (CIPRO) 500 MG tablet Take 1 tablet (500 mg total) by mouth every 12 (twelve) hours for 5 days.   insulin NPH-regular Human (NOVOLIN 70/30 RELION) (70-30) 100 UNIT/ML injection Inject 15 Units into the skin 2 (two) times daily with a meal.   Insulin Pen Needle 32G X 4 MM MISC Use as directed   insulin regular (NOVOLIN  R) 100 units/mL injection Inject 0-20 Units into the skin 4 (four) times daily as needed for high blood sugar.   Omega-3 Fatty Acids (FISH OIL PO) Take by mouth.   Oxycodone HCl 10 MG TABS Take 10 mg by mouth 3 (three) times daily as needed.   protein supplement shake (PREMIER PROTEIN) LIQD Take 2 oz by mouth 3 (three) times daily with meals.   testosterone cypionate (DEPOTESTOSTERONE CYPIONATE) 200 MG/ML injection Inject 1 mL (200 mg total) into the muscle every 14 (fourteen) days.   zolpidem (AMBIEN) 10 MG tablet TAKE 1 TABLET BY MOUTH AT BEDTIME AS NEEDED FOR  SLEEP.  TAKE  1  HOUR  PRIOR  TO  BEDTIME.   [DISCONTINUED] furosemide (LASIX) 20 MG tablet Take 1 tablet (20 mg total) by mouth daily.   [DISCONTINUED] lisinopril (ZESTRIL) 10 MG tablet Take 10 mg by mouth daily.     Review of Systems      All other systems reviewed and are otherwise negative except as noted above.  Physical Exam    VS:  BP 126/68   Pulse 94   Ht '6\' 3"'  (1.905 m)   SpO2 96%   BMI 26.00 kg/m  , BMI Body mass index is 26 kg/m.  Wt Readings from Last 3 Encounters:  01/21/22 208 lb (94.3 kg)  12/31/21 208 lb (94.3 kg)  09/14/21 240 lb 4.8 oz (109 kg)     GEN: Well nourished, well developed, in no acute distress. HEENT: normal. Neck: Supple, no JVD, carotid bruits, or masses. Cardiac: RRR, no murmurs, rubs, or gallops. No clubbing, cyanosis, edema.  Radials/PT 2+ and equal bilaterally.  Respiratory:  Respirations regular and unlabored, clear to auscultation bilaterally. GI: Soft, nontender, nondistended. MS: No deformity or atrophy. Skin: Warm and dry, no rash. Neuro:  Strength and sensation are intact. Psych: Normal affect.  Assessment & Plan    CHF -Fluid overload has been controlled on Lasix 20 mg daily -At one point he was on Lasix 40 mg daily but he was losing a lot of weight at that point and prefers not to be on this dose -Potentially transition from Lisinopril to [place] in the future. He  likes lisinopril since he can get it for free.  -Last echocardiogram was in the setting of bacteremia.  EF at that time was 35%. -He appears euvolemic on exam today and SOB has been stable -Will need follow-up echocardiogram at next appointment  HTN -Well-controlled today, 126/68 -Continue lisinopril 10 mg daily   DM -A1C 8.6, medications per primary -on insulin  CKD -Kidney function normal on last labs -Avoid nephrotoxic drugs if possible  Right BKA, Left BKA (11/22) -Prostatics in place -first amputation was not due to osteomyelitis, second amputation was due to osteomyelitis          Disposition: Follow up 1 year with Sanda Klein, MD or APP.  Signed, Elgie Collard, PA-C 04/14/2022, 3:49 PM [facility]

## 2022-04-14 ENCOUNTER — Ambulatory Visit (INDEPENDENT_AMBULATORY_CARE_PROVIDER_SITE_OTHER): Payer: Medicare HMO | Admitting: Physician Assistant

## 2022-04-14 ENCOUNTER — Encounter: Payer: Self-pay | Admitting: Physician Assistant

## 2022-04-14 VITALS — BP 126/68 | HR 94 | Ht 75.0 in

## 2022-04-14 DIAGNOSIS — E782 Mixed hyperlipidemia: Secondary | ICD-10-CM

## 2022-04-14 DIAGNOSIS — Z89511 Acquired absence of right leg below knee: Secondary | ICD-10-CM | POA: Diagnosis not present

## 2022-04-14 DIAGNOSIS — N1831 Chronic kidney disease, stage 3a: Secondary | ICD-10-CM

## 2022-04-14 DIAGNOSIS — T8789 Other complications of amputation stump: Secondary | ICD-10-CM

## 2022-04-14 DIAGNOSIS — I10 Essential (primary) hypertension: Secondary | ICD-10-CM | POA: Diagnosis not present

## 2022-04-14 DIAGNOSIS — I5032 Chronic diastolic (congestive) heart failure: Secondary | ICD-10-CM

## 2022-04-14 DIAGNOSIS — Z89512 Acquired absence of left leg below knee: Secondary | ICD-10-CM | POA: Diagnosis not present

## 2022-04-14 DIAGNOSIS — R0602 Shortness of breath: Secondary | ICD-10-CM

## 2022-04-14 MED ORDER — FUROSEMIDE 20 MG PO TABS: 20.0000 mg | ORAL_TABLET | Freq: Every day | ORAL | 11 refills | Status: DC

## 2022-04-14 MED ORDER — LISINOPRIL 10 MG PO TABS: 10.0000 mg | ORAL_TABLET | Freq: Every day | ORAL | 11 refills | Status: DC

## 2022-04-14 NOTE — Patient Instructions (Signed)
Medication Instructions:  Your physician recommends that you continue on your current medications as directed. Please refer to the Current Medication list given to you today.  *If you need a refill on your cardiac medications before your next appointment, please call your pharmacy*   Lab Work: None If you have labs (blood work) drawn today and your tests are completely normal, you will receive your results only by: [place] (if you have MyChart) OR A paper copy in the mail If you have any lab test that is abnormal or we need to change your treatment, we will call you to review the results.   Follow-Up: At [facility], you and your health needs are our priority.  As part of our continuing mission to provide you with exceptional heart care, we have created designated Provider Care Teams.  These Care Teams include your primary Cardiologist (physician) and Advanced Practice Providers (APPs -  Physician Assistants and Nurse Practitioners) who all work together to provide you with the care you need, when you need it.  Your next appointment:   1 year(s)  The format for your next appointment:   In Person  Provider:   Nicholes Rough, PA-C       Important Information About Sugar

## 2022-04-15 ENCOUNTER — Other Ambulatory Visit: Payer: Self-pay | Admitting: Family

## 2022-04-15 ENCOUNTER — Encounter (HOSPITAL_BASED_OUTPATIENT_CLINIC_OR_DEPARTMENT_OTHER): Payer: Medicare HMO | Attending: General Surgery | Admitting: General Surgery

## 2022-04-15 DIAGNOSIS — G8929 Other chronic pain: Secondary | ICD-10-CM | POA: Diagnosis not present

## 2022-04-15 DIAGNOSIS — I509 Heart failure, unspecified: Secondary | ICD-10-CM | POA: Diagnosis not present

## 2022-04-15 DIAGNOSIS — I110 Hypertensive heart disease with heart failure: Secondary | ICD-10-CM | POA: Insufficient documentation

## 2022-04-15 DIAGNOSIS — Z89512 Acquired absence of left leg below knee: Secondary | ICD-10-CM | POA: Diagnosis not present

## 2022-04-15 DIAGNOSIS — E119 Type 2 diabetes mellitus without complications: Secondary | ICD-10-CM | POA: Insufficient documentation

## 2022-04-15 DIAGNOSIS — L89154 Pressure ulcer of sacral region, stage 4: Secondary | ICD-10-CM | POA: Insufficient documentation

## 2022-04-15 DIAGNOSIS — Z89511 Acquired absence of right leg below knee: Secondary | ICD-10-CM | POA: Insufficient documentation

## 2022-04-15 NOTE — Progress Notes (Signed)
[name], [name] ([id number]) Visit Report for 04/15/2022 Chief Complaint Document Details Patient Name: Date of Service: [name], [place] 04/15/2022 2:00 PM Medical Record Number: [id number] Patient Account Number: [id number] Date of Birth/Sex: Treating RN: [birth year] (56 y.o. [name] Primary Care Provider: Jeanie [name] Other Clinician: Referring Provider: Treating Provider/Extender: [name] in Treatment: 13 Information Obtained from: Patient Chief Complaint Patient is at the clinic for treatment of an open pressure ulcer Electronic Signature(s) Signed: 04/15/2022 2:51:59 PM By: [name] Maudlin MD FACS Entered By: [name] on 04/15/2022 14:51:58 -------------------------------------------------------------------------------- Debridement Details Patient Name: Date of Service: [name], [place] 04/15/2022 2:00 PM Medical Record Number: [id number] Patient Account Number: [id number] Date of Birth/Sex: Treating RN: [birth year] (56 y.o. [name] Primary Care Provider: Jeanie [name] Other Clinician: Referring Provider: Treating Provider/Extender: [name] in Treatment: 13 Debridement Performed for Assessment: Wound #1 Sacrum Performed By: Physician [name] Maudlin, MD Debridement Type: Debridement Level of Consciousness (Pre-procedure): Awake and Alert Pre-procedure Verification/Time Out Yes - 14:40 Taken: Start Time: 14:40 Pain Control: Lidocaine 4% T opical Solution T Area Debrided (L x W): otal 3 (cm) x 2.6 (cm) = 7.8 (cm) Tissue and other material debrided: Non-Viable, Callus, Skin: Epidermis Level: Skin/Epidermis Debridement Description: Selective/Open Wound Instrument: Curette Bleeding: Minimum Hemostasis Achieved: Pressure Procedural Pain: 0 Post Procedural Pain: 0 Response to Treatment: Procedure was tolerated well Level of Consciousness (Post- Awake and  Alert procedure): Post Debridement Measurements of Total Wound Length: (cm) 2 Stage: Category/Stage IV Width: (cm) 1.6 Depth: (cm) 1.5 Volume: (cm) 3.77 Character of Wound/Ulcer Post Debridement: Improved Post Procedure Diagnosis Same as Pre-procedure Electronic Signature(s) Signed: 04/15/2022 3:56:25 PM By: [name] Maudlin MD FACS Signed: 04/15/2022 6:15:39 PM By: [name] Gouty RN, BSN Entered By: [name] on 04/15/2022 14:42:27 -------------------------------------------------------------------------------- HPI Details Patient Name: Date of Service: [name], [name] 04/15/2022 2:00 PM Medical Record Number: [id number] Patient Account Number: [id number] Date of Birth/Sex: Treating RN: Jul 31, 1966 (56 y.o. [name] Primary Care Provider: Jeanie [name] Other Clinician: Referring Provider: Treating Provider/Extender: [name] in Treatment: 13 History of Present Illness HPI Description: ADMISSION 01/08/2022 This is a 56 year old male with a past medical history notable for type 2 diabetes mellitus (last A1c was 8.6) congestive heart failure, hypertension, chronic pain, and bilateral below-knee amputations. His most recent amputation was in November 2022. While he was in the hospital, he developed a sacral pressure ulcer. He was subsequently in a skilled nursing facility for some time. He was discharged with home health and had been in a wound VAC, but was then admitted to the hospital last week when the wound appeared to be worsening. Apparently the periwound skin was macerated and the device that he had been using was leaking quite a bit. Evaluation while in the hospital included a consultation with infectious disease who did not think he had any evidence of osteomyelitis, plastic surgery who felt that he was not an appropriate flap candidate (their note also states that he is not interested in a flap) and wound care who took him out  of the wound VAC and initiated wet-to-dry dressing changes. He has a new wound VAC from [facility] on order, anticipated delivery today. The wound itself is fairly small and isolated to the sacrum. There is muscle exposed. No bone is appreciated but it is palpable beneath the surface. The muscle itself is bit pale and there is heaped up epibole around the wound edges. No significant odor  or drainage. 01/14/2022: His wound VAC was not initiated until this past Friday. He has not had any issues with the VAC but today we found that the bridge foam was applied directly to the skin rather than over a layer of adhesive drape. His home health nurse also requested that we consider applying silver collagen to the wound bed in addition to the VAC. There is still a little bit of heaped up senescent skin around the wound periphery. No significant change to the wound dimensions. 01/21/2022: No significant change to the wound dimensions. The senescent skin has not reaccumulated. The periwound skin remains a bit macerated but without any obvious breakdown. The wound surface itself has a shiny appearance with a little bit of slough accumulation; no true robust granulation tissue at this time. 01/28/2022: The wound is about the same size but a little bit shallower. There is a little bit of senescent skin reaccumulation at the cranial aspect. The periwound skin is red but not macerated and without any tissue breakdown. The wound still does not have the most robust surface. There is a bit of slough accumulation. 02/04/2022: The wound is a bit smaller and the undermining has come in somewhat. There continues to be granulation tissue formation within the wound bed. No significant slough accumulation and his periwound skin is in better condition. 02/18/2022: The wound stinks today. There is no obvious pus but the drainage and wound itself are malodorous. He continues to have heaped up tissue within the wound bed that is rather grayish  and not particularly robust-appearing. He is very angry today with his situation. 02/25/2022: Last week, in response to the odor coming from the wound, I took a culture and prescribed topical gentamicin as well as oral cefdinir. Apparently [facility] contacted him about a topical compounded antibiotic, but he did not realize this and he hung up on them. Today, the odor is no longer present. There is some senescent skin around the wound margins as well as continued heaped up granulation tissue in the center of the wound. The undermining continues to contract. 03/04/2022: The wound continues to contract and look better. He still has heaped up hypertrophic granulation tissue near the orifice. No odor was coming from the wound today. He is awaiting [facility] delivery. 03/18/2022: 2-week follow-up. [facility] topical antibiotic has been initiated. The chemical cauterization of the hypertrophic granulation tissue was quite successful and the surface is much flatter today. He has heaped up senescent skin around the perimeter. His home health providers have figured out a way to keep the wound VAC from losing suction by bolstering with DuoDERM. Overall there has been significant improvement since our last visit. 04/01/2022: 2-week follow-up. The wound continues to contract. Once again, there is heaped up senescent skin around the perimeter. He has a little bit of skin breakdown in the distribution of the adhesive drape from the wound VAC. No odor or purulent drainage. No concern for infection. 04/15/2022: 2-week follow-up. He has developed a fairly substantial rash from the drape adhesive for his wound VAC. The periwound has a lot of heaped up epiboly at the margins. The tissue in the wound bed is a little bit pale but there is no odor or purulent drainage. Electronic Signature(s) Signed: 04/15/2022 2:54:45 PM By: [name] Maudlin MD FACS Entered By: [name] on 04/15/2022  14:54:44 -------------------------------------------------------------------------------- Physical Exam Details Patient Name: Date of Service: [name], [place] 04/15/2022 2:00 PM Medical Record Number: [id number] Patient Account Number: [id number] Date of Birth/Sex: Treating RN:  15-Sep-1965 (56 y.o. [name] Primary Care Provider: Jeanie [name] Other Clinician: Referring Provider: Treating Provider/Extender: [name] in Treatment: 13 Constitutional . Slightly tachycardic, asymptomatic. . . No acute distress.Marland Kitchen Respiratory Normal work of breathing on room air.. Notes 04/15/2022: He has developed a fairly substantial rash from the drape adhesive for his wound VAC. The periwound has a lot of heaped up epiboly at the margins. The tissue in the wound bed is a little bit pale but there is no odor or purulent drainage. Electronic Signature(s) Signed: 04/15/2022 2:56:03 PM By: [name] Maudlin MD FACS Entered By: [name] on 04/15/2022 14:56:03 -------------------------------------------------------------------------------- Physician Orders Details Patient Name: Date of Service: [name], [place] 04/15/2022 2:00 PM Medical Record Number: [id number] Patient Account Number: [id number] Date of Birth/Sex: Treating RN: 04/25/1966 (56 y.o. [name] Primary Care Provider: Jeanie [name] Other Clinician: Referring Provider: Treating Provider/Extender: [name] in Treatment: [phone] Verbal / Phone Orders: No Diagnosis Coding ICD-10 Coding Code Description L89.154 Pressure ulcer of sacral region, stage 4 Z89.512 Acquired absence of left leg below knee Z89.511 Acquired absence of right leg below knee Follow-up Appointments ppointment in 1 week. - Dr. Celine Ahr RM1 with [facility] Return A Wed. 8/16 @ 2:00 pm Bathing/ Shower/ Hygiene May shower with protection but do not get wound dressing(s) wet. Negative  Presssure Wound Therapy Wound #1 Sacrum Wound Vac to wound continuously at 129m/hg pressure - Hold VAC for one week Black Foam Off-Loading Gel mattress overlay (Group 1) Turn and reposition every 2 hours Home Health New wound care orders this week; continue Home Health for wound care. May utilize formulary equivalent dressing for wound treatment orders unless otherwise specified. - Hold VAC this week, pack wound with [facility] compound moistened gauze, Dressing changes to be completed by [place] Monday / Wednesday / Friday [facility] Other [facility] Health Orders/Instructions: - [facility] Wound Treatment Wound #1 - Sacrum Peri-Wound Care: Triamcinolone 15 (g) 3 x Per Week/30 Days Discharge Instructions: Use triamcinolone 15 (g)to rash daily Topical: [facility] antibiotic compound 3 x Per Week/30 Days Discharge Instructions: in base of wound under gauze Prim Dressing: Medline Woven Gauze Sponges 4x4 (in/in) 3 x Per Week/30 Days ary Discharge Instructions: pack into wound, moisten with saline and [facility] compound Secondary Dressing: MPM Excel SAP Bordered Dressing, 7x6.7 (Sacral) (in/in) 3 x Per Week/30 Days Discharge Instructions: Apply silicone border over primary dressing as directed. Patient Medications llergies: Bactrim, Ceprotin ([place]), ciprofloxacin, Levaquin A Notifications Medication Indication Start End 04/15/2022 triamcinolone acetonide DOSE topical 0.1 % cream - Apply a thin layer to rash daily Electronic Signature(s) Signed: 04/15/2022 3:56:25 PM By: CFredirick MaudlinMD FACS Previous Signature: 04/15/2022 2:57:52 PM Version By: CFredirick MaudlinMD FACS Entered By: CFredirick Maudlinon 04/15/2022 14:58:03 -------------------------------------------------------------------------------- Problem List Details Patient Name: Date of Service: [name]8/05/2022 2:00 PM Medical Record Number: [id number] Account  Number: [id number] of Birth/Sex: Treating RN: [id number](56y.o. MErnestene MentionPrimary Care Provider: HJeanie SewerOther Clinician: Referring Provider: Treating Provider/Extender: CChristie Nottinghamin Treatment: 13 Active Problems ICD-10 Encounter Code Description Active Date MDM Diagnosis L89.154 Pressure ulcer of sacral region, stage 4 01/08/2022 No Yes Z89.512 Acquired absence of left leg below knee 01/08/2022 No Yes Z89.511 Acquired absence of right leg below knee 01/08/2022 No Yes Inactive Problems Resolved Problems Electronic Signature(s) Signed: 04/15/2022 2:51:37 PM By: CFredirick MaudlinMD FACS Signed:  04/15/2022 2:51:37 PM By: [name] Maudlin MD FACS Entered By: [name] on 04/15/2022 14:51:37 -------------------------------------------------------------------------------- Progress Note Details Patient Name: Date of Service: [name], [place] 04/15/2022 2:00 PM Medical Record Number: [id number] Patient Account Number: [id number] Date of Birth/Sex: Treating RN: 13-Feb-1966 (56 y.o. [name] Primary Care Provider: Jeanie [name] Other Clinician: Referring Provider: Treating Provider/Extender: [name] in Treatment: 13 Subjective Chief Complaint Information obtained from Patient Patient is at the clinic for treatment of an open pressure ulcer History of Present Illness (HPI) ADMISSION 01/08/2022 This is a 56 year old male with a past medical history notable for type 2 diabetes mellitus (last A1c was 8.6) congestive heart failure, hypertension, chronic pain, and bilateral below-knee amputations. His most recent amputation was in November 2022. While he was in the hospital, he developed a sacral pressure ulcer. He was subsequently in a skilled nursing facility for some time. He was discharged with home health and had been in a wound VAC, but was then admitted to the hospital last week when  the wound appeared to be worsening. Apparently the periwound skin was macerated and the device that he had been using was leaking quite a bit. Evaluation while in the hospital included a consultation with infectious disease who did not think he had any evidence of osteomyelitis, plastic surgery who felt that he was not an appropriate flap candidate (their note also states that he is not interested in a flap) and wound care who took him out of the wound VAC and initiated wet-to-dry dressing changes. He has a new wound VAC from [facility] on order, anticipated delivery today. The wound itself is fairly small and isolated to the sacrum. There is muscle exposed. No bone is appreciated but it is palpable beneath the surface. The muscle itself is bit pale and there is heaped up epibole around the wound edges. No significant odor or drainage. 01/14/2022: His wound VAC was not initiated until this past Friday. He has not had any issues with the VAC but today we found that the bridge foam was applied directly to the skin rather than over a layer of adhesive drape. His home health nurse also requested that we consider applying silver collagen to the wound bed in addition to the VAC. There is still a little bit of heaped up senescent skin around the wound periphery. No significant change to the wound dimensions. 01/21/2022: No significant change to the wound dimensions. The senescent skin has not reaccumulated. The periwound skin remains a bit macerated but without any obvious breakdown. The wound surface itself has a shiny appearance with a little bit of slough accumulation; no true robust granulation tissue at this time. 01/28/2022: The wound is about the same size but a little bit shallower. There is a little bit of senescent skin reaccumulation at the cranial aspect. The periwound skin is red but not macerated and without any tissue breakdown. The wound still does not have the most robust surface. There is a bit of  slough accumulation. 02/04/2022: The wound is a bit smaller and the undermining has come in somewhat. There continues to be granulation tissue formation within the wound bed. No significant slough accumulation and his periwound skin is in better condition. 02/18/2022: The wound stinks today. There is no obvious pus but the drainage and wound itself are malodorous. He continues to have heaped up tissue within the wound bed that is rather grayish and not particularly robust-appearing. He is very angry today with his situation. 02/25/2022:  Last week, in response to the odor coming from the wound, I took a culture and prescribed topical gentamicin as well as oral cefdinir. Apparently [facility] contacted him about a topical compounded antibiotic, but he did not realize this and he hung up on them. Today, the odor is no longer present. There is some senescent skin around the wound margins as well as continued heaped up granulation tissue in the center of the wound. The undermining continues to contract. 03/04/2022: The wound continues to contract and look better. He still has heaped up hypertrophic granulation tissue near the orifice. No odor was coming from the wound today. He is awaiting [facility] delivery. 03/18/2022: 2-week follow-up. [facility] topical antibiotic has been initiated. The chemical cauterization of the hypertrophic granulation tissue was quite successful and the surface is much flatter today. He has heaped up senescent skin around the perimeter. His home health providers have figured out a way to keep the wound VAC from losing suction by bolstering with DuoDERM. Overall there has been significant improvement since our last visit. 04/01/2022: 2-week follow-up. The wound continues to contract. Once again, there is heaped up senescent skin around the perimeter. He has a little bit of skin breakdown in the distribution of the adhesive drape from the wound VAC. No odor or purulent drainage. No concern  for infection. 04/15/2022: 2-week follow-up. He has developed a fairly substantial rash from the drape adhesive for his wound VAC. The periwound has a lot of heaped up epiboly at the margins. The tissue in the wound bed is a little bit pale but there is no odor or purulent drainage. Patient History Information obtained from Patient, Caregiver, Chart. Family History Cancer - Father, Heart Disease - Father,Paternal Grandparents, Hypertension - Father, No family history of Diabetes, Hereditary Spherocytosis, Kidney Disease, Lung Disease, Seizures, Stroke, Thyroid Problems, Tuberculosis. Social History Never smoker, Marital Status - Divorced, Alcohol Use - Never, Drug Use - No History, Caffeine Use - Moderate - coffee. Medical History Eyes Denies history of Cataracts, Glaucoma, Optic Neuritis Ear/Nose/Mouth/Throat Denies history of Chronic sinus problems/congestion, Middle ear problems Cardiovascular Patient has history of Congestive Heart Failure, Hypertension Endocrine Patient has history of Type II Diabetes Denies history of Type I Diabetes Genitourinary Denies history of End Stage Renal Disease Integumentary (Skin) Denies history of History of Burn Musculoskeletal Patient has history of Osteomyelitis - S1 and coccyx Oncologic Denies history of Received Chemotherapy, Received Radiation Psychiatric Patient has history of Confinement Anxiety Denies history of Anorexia/bulimia Hospitalization/Surgery History - bil BKA. Medical A Surgical History Notes nd Respiratory pulmonary nodules Genitourinary renal mass, urinary hesitancy Musculoskeletal discitis of thoracic region Objective Constitutional Slightly tachycardic, asymptomatic. No acute distress.. Vitals Time Taken: 2:19 PM, Temperature: 97.8 F, Pulse: 105 bpm, Respiratory Rate: 18 breaths/min, Blood Pressure: 132/84 mmHg, Capillary Blood Glucose: 195 mg/dl. General Notes: glucose per pt report  yesterday Respiratory Normal work of breathing on room air.. General Notes: 04/15/2022: He has developed a fairly substantial rash from the drape adhesive for his wound VAC. The periwound has a lot of heaped up epiboly at the margins. The tissue in the wound bed is a little bit pale but there is no odor or purulent drainage. Integumentary (Hair, Skin) Wound #1 status is Open. Original cause of wound was Pressure Injury. The date acquired was: 07/29/2021. The wound has been in treatment 13 weeks. The wound is located on the Sacrum. The wound measures 2cm length x 1.6cm width x 1.5cm depth; 2.513cm^2 area and 3.77cm^3 volume. There is Fat  Layer (Subcutaneous Tissue) exposed. There is undermining starting at 11:00 and ending at 5:00 with a maximum distance of 2cm. There is a medium amount of serosanguineous drainage noted. The wound margin is epibole. There is medium (34-66%) pink, pale granulation within the wound bed. There is a medium (34- 66%) amount of necrotic tissue within the wound bed including Adherent Slough. 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 There was a Selective/Open Wound Skin/Epidermis Debridement with a total area of 7.8 sq cm performed by [name] Maudlin, MD. With the following instrument(s): Curette to remove Non-Viable tissue/material. Material removed includes Callus and Skin: Epidermis and after achieving pain control using Lidocaine 4% Topical Solution. No specimens were taken. A time out was conducted at 14:40, prior to the start of the procedure. A Minimum amount of bleeding was controlled with Pressure. The procedure was tolerated well with a pain level of 0 throughout and a pain level of 0 following the procedure. Post Debridement Measurements: 2cm  length x 1.6cm width x 1.5cm depth; 3.77cm^3 volume. Post debridement Stage noted as Category/Stage IV. Character of Wound/Ulcer Post Debridement is improved. Post procedure Diagnosis Wound #1: Same as Pre-Procedure Plan Follow-up Appointments: Return Appointment in 1 week. - Dr. Celine Ahr RM1 with [facility]. 8/16 @ 2:00 pm Bathing/ Shower/ Hygiene: May shower with protection but do not get wound dressing(s) wet. Negative Presssure Wound Therapy: Wound #1 Sacrum: Wound Vac to wound continuously at 179m/hg pressure - Hold VAC for one week Black Foam Off-Loading: Gel mattress overlay (Group 1) Turn and reposition every 2 hours Home Health: New wound care orders this week; continue Home Health for wound care. May utilize formulary equivalent dressing for wound treatment orders unless otherwise specified. - Hold VAC this week, pack wound with [facility] compound moistened gauze, Dressing changes to be [place] / Wednesday / Friday [facility] Orders/Instructions: -Jackquline DenmarkThe following medication(s) was prescribed: triamcinolone acetonide topical 0.1 % cream Apply a thin layer to rash daily starting 04/15/2022 WOUND #1: - Sacrum Wound Laterality: Peri-Wound Care: Triamcinolone 15 (g) 3 x Per Week/30 Days Discharge Instructions: Use triamcinolone 15 (g)to rash daily Topical: [facility] antibiotic compound 3 x Per Week/30 Days Discharge Instructions: in base of wound under gauze Prim Dressing: Medline Woven Gauze Sponges 4x4 (in/in) 3 x Per Week/30 Days ary Discharge Instructions: pack into wound, moisten with saline and [facility] compound Secondary Dressing: MPM Excel SAP Bordered Dressing, 7x6.7 (Sacral) (in/in) 3 x Per Week/30 Days Discharge Instructions: Apply silicone border over primary dressing as directed. 04/15/2022: He has developed a fairly substantial rash from the drape adhesive for his wound  VAC. The periwound has a lot of heaped up epiboly at the margins. The tissue in the wound bed is a little bit pale but there is no odor or purulent drainage. I used a curette to debride the senescent skin heaped up at the wound margins. The wound bed did not require debridement. Due to the rash and skin breakdown from the adhesive drape, we are going to give him at least 1 week off of the wound VAC to see if we can get this healed up. I prescribed triamcinolone cream for him to apply to the rash area. We will  continue use his topical [facility] antibiotic and use it to moisten the gauze for moist dressing changes to his sacral wound. Follow-up in 1 week. Electronic Signature(s) Signed: 04/15/2022 2:59:02 PM By: [name] Maudlin MD FACS Entered By: [name] on 04/15/2022 14:59:01 -------------------------------------------------------------------------------- HxROS Details Patient Name: Date of Service: [name], [place] 04/15/2022 2:00 PM Medical Record Number: [id number] Patient Account Number: [id number] Date of Birth/Sex: Treating RN: Apr 21, 1966 (56 y.o. [name] Primary Care Provider: Jeanie [name] Other Clinician: Referring Provider: Treating Provider/Extender: [name] in Treatment: 13 Information Obtained From Patient Caregiver Chart Eyes Medical History: Negative for: Cataracts; Glaucoma; Optic Neuritis Ear/Nose/Mouth/Throat Medical History: Negative for: Chronic sinus problems/congestion; Middle ear problems Respiratory Medical History: Past Medical History Notes: pulmonary nodules Cardiovascular Medical History: Positive for: Congestive Heart Failure; Hypertension Endocrine Medical History: Positive for: Type II Diabetes Negative for: Type I Diabetes Time with diabetes: since 2004 Treated with: Insulin Blood sugar tested every day: Yes Tested : 4 times per day Genitourinary Medical History: Negative for: End Stage  Renal Disease Past Medical History Notes: renal mass, urinary hesitancy Integumentary (Skin) Medical History: Negative for: History of Burn Musculoskeletal Medical History: Positive for: Osteomyelitis - S1 and coccyx Past Medical History Notes: discitis of thoracic region Oncologic Medical History: Negative for: Received Chemotherapy; Received Radiation Psychiatric Medical History: Positive for: Confinement Anxiety Negative for: Anorexia/bulimia Immunizations Pneumococcal Vaccine: Received Pneumococcal Vaccination: No Implantable Devices No devices added Hospitalization / Surgery History Type of Hospitalization/Surgery bil BKA Family and Social History Cancer: Yes - Father; Diabetes: No; Heart Disease: Yes - Father,Paternal Grandparents; Hereditary Spherocytosis: No; Hypertension: Yes - Father; Kidney Disease: No; Lung Disease: No; Seizures: No; Stroke: No; Thyroid Problems: No; Tuberculosis: No; Never smoker; Marital Status - Divorced; Alcohol Use: Never; Drug Use: No History; Caffeine Use: Moderate - coffee; Financial Concerns: No; Food, Clothing or Shelter Needs: No; Support System Lacking: No; Transportation Concerns: Yes - hurts to transfer Electronic Signature(s) Signed: 04/15/2022 3:56:25 PM By: [name] Maudlin MD FACS Signed: 04/15/2022 6:15:39 PM By: [name] Gouty RN, BSN Entered By: [name] on 04/15/2022 14:54:57 -------------------------------------------------------------------------------- [place] Details Patient Name: Date of Service: [name], [place] 04/15/2022 Medical Record Number: [id number] Patient Account Number: [id number] Date of Birth/Sex: Treating RN: [birth year] (56 y.o. 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 Facility Procedures CPT4 Code: [id number] Description: 16384 - DEBRIDE WOUND 1ST 20 SQ CM OR < ICD-10 Diagnosis Description L89.154 Pressure ulcer of sacral region, stage 4 Modifier: Quantity: 1 Physician Procedures : CPT4 Code Description Modifier [id number] - WC PHYS LEVEL 4 - EST PT 25 ICD-10 Diagnosis Description L89.154 Pressure ulcer of sacral region, stage 4 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

## 2022-04-15 NOTE — Progress Notes (Signed)
DAMEIR, GENTZLER ([id number]) Visit Report for 04/15/2022 Arrival Information Details Patient Name: Date of Service: [place], [place] 04/15/2022 2:00 PM Medical Record Number: [id number] Patient Account Number: [id number] Date of Birth/Sex: Treating RN: 1965-09-26 (56 y.o. [name] Primary Care Maaz Spiering: [name] Other Clinician: Referring Lorrin Bodner: Treating Soleil Mas/Extender: [name] in Treatment: 13 Visit Information History Since Last Visit Added or deleted any medications: Yes Patient Arrived: Wheel Chair Any new allergies or adverse reactions: No Arrival Time: 14:17 Had a fall or experienced change in No Accompanied By: friend activities of daily living that may affect Transfer Assistance: None risk of falls: Patient Identification Verified: Yes Signs or symptoms of abuse/neglect since last visito No Secondary Verification Process Completed: Yes Hospitalized since last visit: No Patient Requires Transmission-Based Precautions: No Implantable device outside of the clinic excluding No Patient Has Alerts: No cellular tissue based products placed in the center since last visit: Pain Present Now: Yes Electronic Signature(s) Signed: 04/15/2022 6:15:39 PM By: Baruch Gouty RN, BSN Entered By: Baruch Gouty on 04/15/2022 14:19:44 -------------------------------------------------------------------------------- Encounter Discharge Information Details Patient Name: Date of Service: [name], [name] 04/15/2022 2:00 PM Medical Record Number: [id number] Patient Account Number: [id number] Date of Birth/Sex: Treating RN: July 01, 1966 (56 y.o. [name] Primary Care Andreanna Mikolajczak: [name] Other Clinician: Referring Amilio Zehnder: Treating Rockelle Heuerman/Extender: [name] in Treatment: 13 Encounter Discharge Information Items Post Procedure Vitals Discharge Condition: Stable Temperature (F):  97.8 Ambulatory Status: Wheelchair Pulse (bpm): 105 Discharge Destination: Home Respiratory Rate (breaths/min): 18 Transportation: Private Auto Blood Pressure (mmHg): 132/84 Accompanied By: friend Schedule Follow-up Appointment: Yes Clinical Summary of Care: Patient Declined Electronic Signature(s) Signed: 04/15/2022 6:15:39 PM By: Baruch Gouty RN, BSN Entered By: Baruch Gouty on 04/15/2022 15:06:16 -------------------------------------------------------------------------------- Lower Extremity Assessment Details Patient Name: Date of Service: [name], [place] 04/15/2022 2:00 PM Medical Record Number: [id number] Patient Account Number: [id number] Date of Birth/Sex: Treating RN: 13-May-1966 (56 y.o. [name] Primary Care Kolden Dupee: [name] Other Clinician: Referring Mordecai Tindol: Treating Dalayna Lauter/Extender: [name] in Treatment: 13 Electronic Signature(s) Signed: 04/15/2022 6:15:39 PM By: Baruch Gouty RN, BSN Entered By: Baruch Gouty on 04/15/2022 14:22:59 -------------------------------------------------------------------------------- Multi Wound Chart Details Patient Name: Date of Service: [name], [place] 04/15/2022 2:00 PM Medical Record Number: [id number] Patient Account Number: [id number] Date of Birth/Sex: Treating RN: Jan 14, 1966 (56 y.o. [name] Primary Care Orlandria Kissner: [name] Other Clinician: Referring Nickayla Mcinnis: Treating Haruki Arnold/Extender: [name] in Treatment: 13 Vital Signs Height(in): Capillary Blood Glucose(mg/dl): 195 Weight(lbs): Pulse(bpm): 105 Body Mass Index(BMI): Blood Pressure(mmHg): 132/84 Temperature(F): 97.8 Respiratory Rate(breaths/min): 18 Photos: [N/A:N/A] Sacrum N/A N/A Wound Location: Pressure Injury N/A N/A Wounding Event: Pressure Ulcer N/A N/A Primary Etiology: Congestive Heart Failure, N/A N/A Comorbid  History: Hypertension, Type II Diabetes, Osteomyelitis, Confinement Anxiety 07/29/2021 N/A N/A Date Acquired: 13 N/A N/A Weeks of Treatment: Open N/A N/A Wound Status: No N/A N/A Wound Recurrence: 2x1.6x1.5 N/A N/A Measurements L x W x D (cm) 2.513 N/A N/A A (cm) : rea 3.77 N/A N/A Volume (cm) : 57.30% N/A N/A % Reduction in A rea: 41.80% N/A N/A % Reduction in Volume: 11 Starting Position 1 (o'clock): 5 Ending Position 1 (o'clock): 2 Maximum Distance 1 (cm): Yes N/A N/A Undermining: Category/Stage IV N/A N/A Classification: Medium N/A N/A Exudate A mount: Serosanguineous N/A N/A Exudate Type: red, brown N/A N/A Exudate Color: Epibole N/A N/A Wound Margin: Medium (34-66%) N/A N/A Granulation Amount: Pink, Pale N/A  N/A Granulation Quality: Medium (34-66%) N/A N/A Necrotic Amount: Fat Layer (Subcutaneous Tissue): Yes N/A N/A Exposed Structures: Fascia: No Tendon: No Muscle: No Joint: No Bone: No Small (1-33%) N/A N/A Epithelialization: Debridement - Selective/Open Wound N/A N/A Debridement: Pre-procedure Verification/Time Out 14:40 N/A N/A Taken: Lidocaine 4% Topical Solution N/A N/A Pain Control: Callus N/A N/A Tissue Debrided: Skin/Epidermis N/A N/A Level: 7.8 N/A N/A Debridement A (sq cm): rea Curette N/A N/A Instrument: Minimum N/A N/A Bleeding: Pressure N/A N/A Hemostasis A chieved: 0 N/A N/A Procedural Pain: 0 N/A N/A Post Procedural Pain: Procedure was tolerated well N/A N/A Debridement Treatment Response: 2x1.6x1.5 N/A N/A Post Debridement Measurements L x W x D (cm) 3.77 N/A N/A Post Debridement Volume: (cm) Category/Stage IV N/A N/A Post Debridement Stage: Debridement N/A N/A Procedures Performed: Treatment Notes Electronic Signature(s) Signed: 04/15/2022 2:51:53 PM By: [name] MD FACS Signed: 04/15/2022 6:15:39 PM By: Baruch Gouty RN, BSN Entered By: [name] on 04/15/2022  14:51:53 -------------------------------------------------------------------------------- Multi-Disciplinary Care Plan Details Patient Name: Date of Service: [name], [place] 04/15/2022 2:00 PM Medical Record Number: [id number] Patient Account Number: [id number] Date of Birth/Sex: Treating RN: [birth year] (56 y.o. [name] Primary Care Sarika Baldini: [name] Other Clinician: Referring Huntleigh Doolen: Treating Yoniel Arkwright/Extender: [name] in Treatment: [place] reviewed with physician Active Inactive Nutrition Nursing Diagnoses: Impaired glucose control: actual or potential Potential for alteratiion in Nutrition/Potential for imbalanced nutrition Goals: Patient/caregiver will maintain therapeutic glucose control Date Initiated: 01/08/2022 Target Resolution Date: 04/30/2022 Goal Status: Active Interventions: Assess HgA1c results as ordered upon admission and as needed Assess patient nutrition upon admission and as needed per policy Provide education on elevated blood sugars and impact on wound healing Treatment Activities: Dietary management education, guidance and counseling : 01/08/2022 Patient referred to Primary Care Physician for further nutritional evaluation : 01/08/2022 Notes: Pressure Nursing Diagnoses: Knowledge deficit related to causes and risk factors for pressure ulcer development Knowledge deficit related to management of pressures ulcers Potential for impaired tissue integrity related to pressure, friction, moisture, and shear Goals: Patient/caregiver will verbalize understanding of pressure ulcer management Date Initiated: 01/08/2022 Target Resolution Date: 04/30/2022 Goal Status: Active Interventions: Assess: immobility, friction, shearing, incontinence upon admission and as needed Assess offloading mechanisms upon admission and as needed Assess potential for pressure ulcer upon admission and as  needed Notes: Wound/Skin Impairment Nursing Diagnoses: Impaired tissue integrity Knowledge deficit related to ulceration/compromised skin integrity Goals: Patient/caregiver will verbalize understanding of skin care regimen Date Initiated: 01/08/2022 Target Resolution Date: 04/30/2022 Goal Status: Active Ulcer/skin breakdown will have a volume reduction of 30% by week 4 Date Initiated: 01/08/2022 Date Inactivated: 02/18/2022 Target Resolution Date: 02/05/2022 Goal Status: Unmet Unmet Reason: VAC leaking Ulcer/skin breakdown will have a volume reduction of 50% by week 8 Date Initiated: 02/18/2022 Date Inactivated: 03/04/2022 Target Resolution Date: 03/05/2022 Goal Status: Unmet Unmet Reason: infection Ulcer/skin breakdown will have a volume reduction of 80% by week 12 Date Initiated: 03/04/2022 Date Inactivated: 04/01/2022 Target Resolution Date: 04/02/2022 Goal Status: Unmet Unmet Reason: too much moisture Interventions: Assess patient/caregiver ability to obtain necessary supplies Assess patient/caregiver ability to perform ulcer/skin care regimen upon admission and as needed Assess ulceration(s) every visit Provide education on ulcer and skin care Treatment Activities: Skin care regimen initiated : 01/08/2022 Topical wound management initiated : 01/08/2022 Notes: Electronic Signature(s) Signed: 04/15/2022 6:15:39 PM By: Baruch Gouty RN, BSN Entered By: Baruch Gouty on 04/15/2022 14:35:07 -------------------------------------------------------------------------------- Pain Assessment Details Patient Name: Date of Service: [place],  [name] 04/15/2022 2:00 PM Medical Record Number: [id number] Patient Account Number: [id number] Date of Birth/Sex: Treating RN: 1965-10-18 (56 y.o. [name] Primary Care Jonah Nestle: [name] Other Clinician: Referring Kaan Tosh: Treating Kealan Buchan/Extender: [name] in Treatment: 13 Active  Problems Location of Pain Severity and Description of Pain Patient Has Paino Yes Site Locations Pain Location: Generalized Pain With Dressing Change: No Duration of the Pain. Constant / Intermittento Constant Rate the pain. Current Pain Level: 8 Worst Pain Level: 10 Least Pain Level: 5 Character of Pain Describe the Pain: Aching Pain Management and Medication Current Pain Management: Medication: Yes Is the Current Pain Management Adequate: Adequate How does your wound impact your activities of daily livingo Sleep: Yes Bathing: No Appetite: No Relationship With Others: No Bladder Continence: No Emotions: Yes Bowel Continence: No Hobbies: No Toileting: No Dressing: No Notes reports chronic back pain Electronic Signature(s) Signed: 04/15/2022 6:15:39 PM By: Baruch Gouty RN, BSN Entered By: Baruch Gouty on 04/15/2022 14:22:52 -------------------------------------------------------------------------------- Patient/Caregiver Education Details Patient Name: Date of Service: [name], [name] 8/9/2023andnbsp2:00 PM Medical Record Number: [id number] Patient Account Number: [id number] Date of Birth/Gender: Treating RN: [birth year] (56 y.o. [name] Primary Care Physician: [name] Other Clinician: Referring Physician: Treating Physician/Extender: [name] in Treatment: 13 Education Assessment Education Provided To: Patient Education Topics Provided Elevated Blood Sugar/ Impact on Healing: Methods: Explain/Verbal Responses: Reinforcements needed, State content correctly Pressure: Methods: Explain/Verbal Responses: Reinforcements needed, State content correctly Wound/Skin Impairment: Methods: Explain/Verbal Responses: Reinforcements needed, State content correctly Electronic Signature(s) Signed: 04/15/2022 6:15:39 PM By: Baruch Gouty RN, BSN Entered By: Baruch Gouty on 04/15/2022  14:35:40 -------------------------------------------------------------------------------- Wound Assessment Details Patient Name: Date of Service: [name], [name] 04/15/2022 2:00 PM Medical Record Number: [id number] Patient Account Number: [id number] Date of Birth/Sex: Treating RN: 04/29/1966 (56 y.o. [name] Primary Care Lamond Glantz: [name] Other Clinician: Referring Giann Obara: Treating Encarnacion Scioneaux/Extender: [name] in Treatment: 13 Wound Status Wound Number: 1 Primary Pressure Ulcer Etiology: Wound Location: Sacrum Wound Open Wounding Event: Pressure Injury Status: Date Acquired: 07/29/2021 Comorbid Congestive Heart Failure, Hypertension, Type II Diabetes, Weeks Of Treatment: 13 History: Osteomyelitis, Confinement Anxiety Clustered Wound: No Photos Wound Measurements Length: (cm) 2 Width: (cm) 1.6 Depth: (cm) 1.5 Area: (cm) 2.513 Volume: (cm) 3.77 % Reduction in Area: 57.3% % Reduction in Volume: 41.8% Epithelialization: Small (1-33%) Undermining: Yes Starting Position (o'clock): 11 Ending Position (o'clock): 5 Maximum Distance: (cm) 2 Wound Description Classification: Category/Stage IV Wound Margin: Epibole Exudate Amount: Medium Exudate Type: Serosanguineous Exudate Color: red, brown Foul Odor After Cleansing: No Slough/Fibrino Yes Wound Bed Granulation Amount: Medium (34-66%) Exposed Structure Granulation Quality: Pink, Pale Fascia Exposed: No Necrotic Amount: Medium (34-66%) Fat Layer (Subcutaneous Tissue) Exposed: Yes Necrotic Quality: Adherent Slough Tendon Exposed: No Muscle Exposed: No Joint Exposed: No Bone Exposed: No Treatment Notes Wound #1 (Sacrum) Cleanser Peri-Wound Care Triamcinolone 15 (g) Discharge Instruction: Use triamcinolone 15 (g)to rash daily Topical Keystone antibiotic compound Discharge Instruction: in base of wound under gauze Primary Dressing Medline Woven Gauze  Sponges 4x4 (in/in) Discharge Instruction: pack into wound, moisten with saline and Keystone compound Secondary Dressing MPM Excel SAP Bordered Dressing, 7x6.7 (Sacral) (in/in) Discharge Instruction: Apply silicone border over primary dressing as directed. Secured With Compression Wrap Compression Stockings Environmental education officer) Signed: 04/15/2022 6:15:39 PM By: Baruch Gouty RN, BSN Entered By: Baruch Gouty on 04/15/2022 14:33:24 -------------------------------------------------------------------------------- Vitals Details Patient Name: Date of Service: [name], [name] 04/15/2022  2:00 PM Medical Record Number: [id number] Patient Account Number: [id number] Date of Birth/Sex: Treating RN: July 10, 1966 (56 y.o. [name] Primary Care Tilia Faso: [name] Other Clinician: Referring Amayah Staheli: Treating Hildred Mollica/Extender: [name] in Treatment: 13 Vital Signs Time Taken: 14:19 Temperature (F): 97.8 Pulse (bpm): 105 Respiratory Rate (breaths/min): 18 Blood Pressure (mmHg): 132/84 Capillary Blood Glucose (mg/dl): 195 Reference Range: 80 - 120 mg / dl Notes glucose per pt report yesterday Electronic Signature(s) Signed: 04/15/2022 6:15:39 PM By: Baruch Gouty RN, BSN Entered By: Baruch Gouty on 04/15/2022 14:20:32

## 2022-04-16 ENCOUNTER — Telehealth: Payer: Self-pay | Admitting: Family

## 2022-04-16 NOTE — Telephone Encounter (Signed)
Caller states: -Patient started cipro on 08/07 following ED visit and will finish tomorrow  - Patient was informed that if cipro does not clear symptoms, he must go back to the ED for a pick line of antibiotics - Patient requests a urine culture and UA be ordered so his nurse, Hinton Dyer, can collect this sample and bring it in to our lab for testing tomorrow.  Melissa can be reached with this information at [phone]. A VM may be left.

## 2022-04-16 NOTE — Telephone Encounter (Signed)
Melissa, Occupational therapist, states: - Need to change frequency of occupational therapy visits changed to:  1 visit every 2 weeks for 4 weeks then 1 visit once a week for 3 weeks.

## 2022-04-16 NOTE — Telephone Encounter (Signed)
Caller called back stating that patient now has a visit with urology and his RN would like to "scrap" this request. Disregard message below.

## 2022-04-17 ENCOUNTER — Other Ambulatory Visit: Payer: Self-pay

## 2022-04-17 DIAGNOSIS — L89154 Pressure ulcer of sacral region, stage 4: Secondary | ICD-10-CM | POA: Diagnosis not present

## 2022-04-17 DIAGNOSIS — I5022 Chronic systolic (congestive) heart failure: Secondary | ICD-10-CM | POA: Diagnosis not present

## 2022-04-17 DIAGNOSIS — E291 Testicular hypofunction: Secondary | ICD-10-CM | POA: Diagnosis not present

## 2022-04-17 DIAGNOSIS — G4700 Insomnia, unspecified: Secondary | ICD-10-CM | POA: Diagnosis not present

## 2022-04-17 MED ORDER — SANTYL 250 UNIT/GM EX OINT: 1.0000 | TOPICAL_OINTMENT | Freq: Every day | CUTANEOUS | 0 refills | Status: DC

## 2022-04-21 ENCOUNTER — Telehealth: Payer: Self-pay | Admitting: Family

## 2022-04-21 DIAGNOSIS — R338 Other retention of urine: Secondary | ICD-10-CM | POA: Diagnosis not present

## 2022-04-21 NOTE — Telephone Encounter (Signed)
..  Home Health Certification or Plan of Care Tracking  Is this a Certification or Plan of Care? yes  Reader Agency: [facility]  Order Number:  [id number]  Has charge sheet been attached? yes  Where has form been placed:  In provider's box  Faxed to:   [phone]

## 2022-04-22 ENCOUNTER — Encounter (HOSPITAL_BASED_OUTPATIENT_CLINIC_OR_DEPARTMENT_OTHER): Payer: Medicare HMO | Admitting: General Surgery

## 2022-04-22 DIAGNOSIS — Z89511 Acquired absence of right leg below knee: Secondary | ICD-10-CM | POA: Diagnosis not present

## 2022-04-22 DIAGNOSIS — Z89512 Acquired absence of left leg below knee: Secondary | ICD-10-CM | POA: Diagnosis not present

## 2022-04-22 DIAGNOSIS — R338 Other retention of urine: Secondary | ICD-10-CM | POA: Diagnosis not present

## 2022-04-22 DIAGNOSIS — E119 Type 2 diabetes mellitus without complications: Secondary | ICD-10-CM | POA: Diagnosis not present

## 2022-04-22 DIAGNOSIS — L89154 Pressure ulcer of sacral region, stage 4: Secondary | ICD-10-CM | POA: Diagnosis not present

## 2022-04-22 DIAGNOSIS — I110 Hypertensive heart disease with heart failure: Secondary | ICD-10-CM | POA: Diagnosis not present

## 2022-04-22 DIAGNOSIS — I509 Heart failure, unspecified: Secondary | ICD-10-CM | POA: Diagnosis not present

## 2022-04-22 DIAGNOSIS — G8929 Other chronic pain: Secondary | ICD-10-CM | POA: Diagnosis not present

## 2022-04-23 ENCOUNTER — Encounter: Payer: Self-pay | Admitting: Internal Medicine

## 2022-04-23 ENCOUNTER — Ambulatory Visit (INDEPENDENT_AMBULATORY_CARE_PROVIDER_SITE_OTHER): Payer: Medicare HMO | Admitting: Internal Medicine

## 2022-04-23 VITALS — BP 130/78 | HR 103 | Temp 98.7°F | Ht 75.0 in

## 2022-04-23 DIAGNOSIS — Z89211 Acquired absence of right upper limb below elbow: Secondary | ICD-10-CM

## 2022-04-23 DIAGNOSIS — R338 Other retention of urine: Secondary | ICD-10-CM | POA: Diagnosis not present

## 2022-04-23 DIAGNOSIS — F4310 Post-traumatic stress disorder, unspecified: Secondary | ICD-10-CM | POA: Diagnosis not present

## 2022-04-23 DIAGNOSIS — F5104 Psychophysiologic insomnia: Secondary | ICD-10-CM

## 2022-04-23 DIAGNOSIS — S88111A Complete traumatic amputation at level between knee and ankle, right lower leg, initial encounter: Secondary | ICD-10-CM | POA: Diagnosis not present

## 2022-04-23 DIAGNOSIS — N2889 Other specified disorders of kidney and ureter: Secondary | ICD-10-CM

## 2022-04-23 DIAGNOSIS — Z515 Encounter for palliative care: Secondary | ICD-10-CM | POA: Diagnosis not present

## 2022-04-23 DIAGNOSIS — S88112A Complete traumatic amputation at level between knee and ankle, left lower leg, initial encounter: Secondary | ICD-10-CM | POA: Diagnosis not present

## 2022-04-23 DIAGNOSIS — S88119A Complete traumatic amputation at level between knee and ankle, unspecified lower leg, initial encounter: Secondary | ICD-10-CM | POA: Insufficient documentation

## 2022-04-23 DIAGNOSIS — L89154 Pressure ulcer of sacral region, stage 4: Secondary | ICD-10-CM

## 2022-04-23 MED ORDER — ZOLPIDEM TARTRATE 10 MG PO TABS: ORAL_TABLET | ORAL | 1 refills | Status: DC

## 2022-04-23 NOTE — Assessment & Plan Note (Signed)
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

## 2022-04-23 NOTE — Progress Notes (Signed)
[name], [name] ([id number]) Visit Report for 04/22/2022 Chief Complaint Document Details Patient Name: Date of Service: [place], [name] 04/22/2022 2:00 PM Medical Record Number: [id number] Patient Account Number: [id number] Date of Birth/Sex: Treating RN: [birth year] (56 y.o. [name] Primary Care Provider: [name] Other Clinician: Referring Provider: Treating Provider/Extender: [name] in Treatment: 14 Information Obtained from: Patient Chief Complaint Patient is at the clinic for treatment of an open pressure ulcer Electronic Signature(s) Signed: 04/22/2022 2:54:16 PM By: [name] MD FACS Entered By: [name] on 04/22/2022 14:54:16 -------------------------------------------------------------------------------- Debridement Details Patient Name: Date of Service: [name], [place] 04/22/2022 2:00 PM Medical Record Number: [id number] Patient Account Number: [id number] Date of Birth/Sex: Treating RN: [birth year] (56 y.o. [name] Primary Care Provider: [name] Other Clinician: Referring Provider: Treating Provider/Extender: [name] in Treatment: 14 Debridement Performed for Assessment: Wound #1 Sacrum Performed By: Physician [name], MD Debridement Type: Debridement Level of Consciousness (Pre-procedure): Awake and Alert Pre-procedure Verification/Time Out Yes - 14:45 Taken: Start Time: 14:45 Pain Control: Lidocaine 4% T opical Solution T Area Debrided (L x W): otal 2 (cm) x 1.9 (cm) = 3.8 (cm) Tissue and other material debrided: Non-Viable, Slough, Skin: Epidermis, Slough Level: Skin/Epidermis Debridement Description: Selective/Open Wound Instrument: Curette Bleeding: Minimum Hemostasis Achieved: Pressure Procedural Pain: 0 Post Procedural Pain: 0 Response to Treatment: Procedure was tolerated well Level of Consciousness (Post- Awake and  Alert procedure): Post Debridement Measurements of Total Wound Length: (cm) 2 Stage: Category/Stage IV Width: (cm) 1.9 Depth: (cm) 1.3 Volume: (cm) 3.88 Character of Wound/Ulcer Post Debridement: Improved Post Procedure Diagnosis Same as Pre-procedure Electronic Signature(s) Signed: 04/22/2022 4:09:38 PM By: [name] MD FACS Signed: 04/22/2022 5:58:34 PM By: Baruch Gouty RN, BSN Entered By: Baruch Gouty on 04/22/2022 14:48:34 -------------------------------------------------------------------------------- HPI Details Patient Name: Date of Service: [name], [place] 04/22/2022 2:00 PM Medical Record Number: [id number] Patient Account Number: [id number] Date of Birth/Sex: Treating RN: [birth year] (56 y.o. [name] Primary Care Provider: [name] Other Clinician: Referring Provider: Treating Provider/Extender: [name] in Treatment: 14 History of Present Illness HPI Description: ADMISSION 01/08/2022 This is a 56 year old male with a past medical history notable for type 2 diabetes mellitus (last A1c was 8.6) congestive heart failure, hypertension, chronic pain, and bilateral below-knee amputations. His most recent amputation was in November 2022. While he was in the hospital, he developed a sacral pressure ulcer. He was subsequently in a skilled nursing facility for some time. He was discharged with home health and had been in a wound VAC, but was then admitted to the hospital last week when the wound appeared to be worsening. Apparently the periwound skin was macerated and the device that he had been using was leaking quite a bit. Evaluation while in the hospital included a consultation with infectious disease who did not think he had any evidence of osteomyelitis, plastic surgery who felt that he was not an appropriate flap candidate (their note also states that he is not interested in a flap) and wound care who took him  out of the wound VAC and initiated wet-to-dry dressing changes. He has a new wound VAC from [facility] on order, anticipated delivery today. The wound itself is fairly small and isolated to the sacrum. There is muscle exposed. No bone is appreciated but it is palpable beneath the surface. The muscle itself is bit pale and there is heaped up epibole around the wound edges. No significant  odor or drainage. 01/14/2022: His wound VAC was not initiated until this past Friday. He has not had any issues with the VAC but today we found that the bridge foam was applied directly to the skin rather than over a layer of adhesive drape. His home health nurse also requested that we consider applying silver collagen to the wound bed in addition to the VAC. There is still a little bit of heaped up senescent skin around the wound periphery. No significant change to the wound dimensions. 01/21/2022: No significant change to the wound dimensions. The senescent skin has not reaccumulated. The periwound skin remains a bit macerated but without any obvious breakdown. The wound surface itself has a shiny appearance with a little bit of slough accumulation; no true robust granulation tissue at this time. 01/28/2022: The wound is about the same size but a little bit shallower. There is a little bit of senescent skin reaccumulation at the cranial aspect. The periwound skin is red but not macerated and without any tissue breakdown. The wound still does not have the most robust surface. There is a bit of slough accumulation. 02/04/2022: The wound is a bit smaller and the undermining has come in somewhat. There continues to be granulation tissue formation within the wound bed. No significant slough accumulation and his periwound skin is in better condition. 02/18/2022: The wound stinks today. There is no obvious pus but the drainage and wound itself are malodorous. He continues to have heaped up tissue within the wound bed that is rather  grayish and not particularly robust-appearing. He is very angry today with his situation. 02/25/2022: Last week, in response to the odor coming from the wound, I took a culture and prescribed topical gentamicin as well as oral cefdinir. Apparently [facility] contacted him about a topical compounded antibiotic, but he did not realize this and he hung up on them. Today, the odor is no longer present. There is some senescent skin around the wound margins as well as continued heaped up granulation tissue in the center of the wound. The undermining continues to contract. 03/04/2022: The wound continues to contract and look better. He still has heaped up hypertrophic granulation tissue near the orifice. No odor was coming from the wound today. He is awaiting [place] delivery. 03/18/2022: 2-week follow-up. [facility] topical antibiotic has been initiated. The chemical cauterization of the hypertrophic granulation tissue was quite successful and the surface is much flatter today. He has heaped up senescent skin around the perimeter. His home health providers have figured out a way to keep the wound VAC from losing suction by bolstering with DuoDERM. Overall there has been significant improvement since our last visit. 04/01/2022: 2-week follow-up. The wound continues to contract. Once again, there is heaped up senescent skin around the perimeter. He has a little bit of skin breakdown in the distribution of the adhesive drape from the wound VAC. No odor or purulent drainage. No concern for infection. 04/15/2022: 2-week follow-up. He has developed a fairly substantial rash from the drape adhesive for his wound VAC. The periwound has a lot of heaped up epiboly at the margins. The tissue in the wound bed is a little bit pale but there is no odor or purulent drainage. 04/22/2022: Last week we discontinued the VAC. Today, the skin around his wound is in significantly improved condition. His rash is resolving. He does  have some heaped up senescent skin around the wound margin and a layer of slough on the wound surface, but there is  no concern for active infection. Electronic Signature(s) Signed: 04/22/2022 2:54:59 PM By: [name] MD FACS Entered By: [name] on 04/22/2022 14:54:59 -------------------------------------------------------------------------------- Physical Exam Details Patient Name: Date of Service: [name], [place] 04/22/2022 2:00 PM Medical Record Number: [id number] Patient Account Number: [id number] Date of Birth/Sex: Treating RN: 1966-05-15 (56 y.o. [name] Primary Care Provider: [name] Other Clinician: Referring Provider: Treating Provider/Extender: [name] in Treatment: 14 Constitutional . . . . No acute distress.Marland Kitchen Respiratory Normal work of breathing on room air.. Notes 04/22/2022: Today, the skin around his wound is in significantly improved condition. His rash is resolving. He does have some heaped up senescent skin around the wound margin and a layer of slough on the wound surface, but there is no concern for active infection. Electronic Signature(s) Signed: 04/22/2022 2:55:35 PM By: [name] MD FACS Entered By: [name] on 04/22/2022 14:55:34 -------------------------------------------------------------------------------- Physician Orders Details Patient Name: Date of Service: [name], [place] 04/22/2022 2:00 PM Medical Record Number: [id number] Patient Account Number: [id number] Date of Birth/Sex: Treating RN: 1966-07-17 (56 y.o. [name] Primary Care Provider: [name] Other Clinician: Referring Provider: Treating Provider/Extender: [name] in Treatment: [phone] Verbal / Phone Orders: No Diagnosis Coding ICD-10 Coding Code Description L89.154 Pressure ulcer of sacral region, stage 4 Z89.512 Acquired absence of left leg below  knee Z89.511 Acquired absence of right leg below knee Follow-up Appointments ppointment in 1 week. - Dr. Lucienne Capers with [facility] Return A Wed. 8/23 @ 3:30 pm Anesthetic Wound #1 Sacrum (In clinic) Topical Lidocaine 4% applied to wound bed Bathing/ Shower/ Hygiene May shower with protection but do not get wound dressing(s) wet. Negative Presssure Wound Therapy Wound #1 Sacrum Discontinue wound vac - return VAC to [facility] Off-Loading Gel mattress overlay (Group 1) Turn and reposition every 2 hours Home Health No change in wound care orders this week; continue Home Health for wound care. May utilize formulary equivalent dressing for wound treatment orders unless otherwise specified. Dressing changes to be completed by [facility] on Monday / Wednesday / Friday except when patient has scheduled visit at [facility]. Other Home Health Orders/Instructions: - [facility] Wound Treatment Wound #1 - Sacrum Peri-Wound Care: Triamcinolone 15 (g) 3 x Per Week/30 Days Discharge Instructions: Use triamcinolone 15 (g)to rash daily Topical: [facility] antibiotic compound 3 x Per Week/30 Days Discharge Instructions: in base of wound under gauze Prim Dressing: Medline Woven Gauze Sponges 4x4 (in/in) 3 x Per Week/30 Days ary Discharge Instructions: pack into wound, moisten with saline and [facility] compound Secondary Dressing: MPM Excel SAP Bordered Dressing, 7x6.7 (Sacral) (in/in) 3 x Per Week/30 Days Discharge Instructions: Apply silicone border over primary dressing as directed. Electronic Signature(s) Signed: 04/22/2022 4:09:38 PM By: [name] MD FACS Entered By: [name] on 04/22/2022 15:06:16 -------------------------------------------------------------------------------- Problem List Details Patient Name: Date of Service: [name], [place] 04/22/2022 2:00 PM Medical Record Number: [id number] Patient Account Number: [id number] Date of Birth/Sex: Treating RN: [birth year] (56 y.o.  [name] Primary Care Provider: [name] Other Clinician: Referring Provider: Treating Provider/Extender: [name] in Treatment: 14 Active Problems ICD-10 Encounter Code Description Active Date MDM Diagnosis L89.154 Pressure ulcer of sacral region, stage 4 01/08/2022 No Yes Z89.512 Acquired absence of left leg below knee 01/08/2022 No Yes Z89.511 Acquired absence of right leg below knee 01/08/2022 No Yes Inactive Problems Resolved Problems Electronic Signature(s) Signed: 04/22/2022 2:54:06 PM By: [name] MD FACS  Entered By: [name] on 04/22/2022 14:54:06 -------------------------------------------------------------------------------- Progress Note Details Patient Name: Date of Service: [name] NN, [place] 04/22/2022 2:00 PM Medical Record Number: [id number] Patient Account Number: [id number] Date of Birth/Sex: Treating RN: 04/08/1966 (56 y.o. [name] Primary Care Provider: [name] Other Clinician: Referring Provider: Treating Provider/Extender: [name] in Treatment: 14 Subjective Chief Complaint Information obtained from Patient Patient is at the clinic for treatment of an open pressure ulcer History of Present Illness (HPI) ADMISSION 01/08/2022 This is a 56 year old male with a past medical history notable for type 2 diabetes mellitus (last A1c was 8.6) congestive heart failure, hypertension, chronic pain, and bilateral below-knee amputations. His most recent amputation was in November 2022. While he was in the hospital, he developed a sacral pressure ulcer. He was subsequently in a skilled nursing facility for some time. He was discharged with home health and had been in a wound VAC, but was then admitted to the hospital last week when the wound appeared to be worsening. Apparently the periwound skin was macerated and the device that he had been using was  leaking quite a bit. Evaluation while in the hospital included a consultation with infectious disease who did not think he had any evidence of osteomyelitis, plastic surgery who felt that he was not an appropriate flap candidate (their note also states that he is not interested in a flap) and wound care who took him out of the wound VAC and initiated wet-to-dry dressing changes. He has a new wound VAC from [facility] on order, anticipated delivery today. The wound itself is fairly small and isolated to the sacrum. There is muscle exposed. No bone is appreciated but it is palpable beneath the surface. The muscle itself is bit pale and there is heaped up epibole around the wound edges. No significant odor or drainage. 01/14/2022: His wound VAC was not initiated until this past Friday. He has not had any issues with the VAC but today we found that the bridge foam was applied directly to the skin rather than over a layer of adhesive drape. His home health nurse also requested that we consider applying silver collagen to the wound bed in addition to the VAC. There is still a little bit of heaped up senescent skin around the wound periphery. No significant change to the wound dimensions. 01/21/2022: No significant change to the wound dimensions. The senescent skin has not reaccumulated. The periwound skin remains a bit macerated but without any obvious breakdown. The wound surface itself has a shiny appearance with a little bit of slough accumulation; no true robust granulation tissue at this time. 01/28/2022: The wound is about the same size but a little bit shallower. There is a little bit of senescent skin reaccumulation at the cranial aspect. The periwound skin is red but not macerated and without any tissue breakdown. The wound still does not have the most robust surface. There is a bit of slough accumulation. 02/04/2022: The wound is a bit smaller and the undermining has come in somewhat. There continues to be  granulation tissue formation within the wound bed. No significant slough accumulation and his periwound skin is in better condition. 02/18/2022: The wound stinks today. There is no obvious pus but the drainage and wound itself are malodorous. He continues to have heaped up tissue within the wound bed that is rather grayish and not particularly robust-appearing. He is very angry today with his situation. 02/25/2022: Last week, in response to the odor coming  from the wound, I took a culture and prescribed topical gentamicin as well as oral cefdinir. Apparently [facility] contacted him about a topical compounded antibiotic, but he did not realize this and he hung up on them. Today, the odor is no longer present. There is some senescent skin around the wound margins as well as continued heaped up granulation tissue in the center of the wound. The undermining continues to contract. 03/04/2022: The wound continues to contract and look better. He still has heaped up hypertrophic granulation tissue near the orifice. No odor was coming from the wound today. He is awaiting [place] delivery. 03/18/2022: 2-week follow-up. [facility] topical antibiotic has been initiated. The chemical cauterization of the hypertrophic granulation tissue was quite successful and the surface is much flatter today. He has heaped up senescent skin around the perimeter. His home health providers have figured out a way to keep the wound VAC from losing suction by bolstering with DuoDERM. Overall there has been significant improvement since our last visit. 04/01/2022: 2-week follow-up. The wound continues to contract. Once again, there is heaped up senescent skin around the perimeter. He has a little bit of skin breakdown in the distribution of the adhesive drape from the wound VAC. No odor or purulent drainage. No concern for infection. 04/15/2022: 2-week follow-up. He has developed a fairly substantial rash from the drape adhesive for his wound  VAC. The periwound has a lot of heaped up epiboly at the margins. The tissue in the wound bed is a little bit pale but there is no odor or purulent drainage. 04/22/2022: Last week we discontinued the VAC. Today, the skin around his wound is in significantly improved condition. His rash is resolving. He does have some heaped up senescent skin around the wound margin and a layer of slough on the wound surface, but there is no concern for active infection. Patient History Information obtained from Patient, Caregiver, Chart. Family History Cancer - Father, Heart Disease - Father,Paternal Grandparents, Hypertension - Father, No family history of Diabetes, Hereditary Spherocytosis, Kidney Disease, Lung Disease, Seizures, Stroke, Thyroid Problems, Tuberculosis. Social History Never smoker, Marital Status - Divorced, Alcohol Use - Never, Drug Use - No History, Caffeine Use - Moderate - coffee. Medical History Eyes Denies history of Cataracts, Glaucoma, Optic Neuritis Ear/Nose/Mouth/Throat Denies history of Chronic sinus problems/congestion, Middle ear problems Cardiovascular Patient has history of Congestive Heart Failure, Hypertension Endocrine Patient has history of Type II Diabetes Denies history of Type I Diabetes Genitourinary Denies history of End Stage Renal Disease Integumentary (Skin) Denies history of History of Burn Musculoskeletal Patient has history of Osteomyelitis - S1 and coccyx Oncologic Denies history of Received Chemotherapy, Received Radiation Psychiatric Patient has history of Confinement Anxiety Denies history of Anorexia/bulimia Hospitalization/Surgery History - bil BKA. Medical A Surgical History Notes nd Respiratory pulmonary nodules Genitourinary renal mass, urinary hesitancy Musculoskeletal discitis of thoracic region Objective Constitutional No acute distress.. Vitals Time Taken: 2:29 PM, Temperature: 98.4 F, Pulse: 99 bpm, Respiratory Rate: 18  breaths/min, Blood Pressure: 135/86 mmHg, Capillary Blood Glucose: 175 mg/dl. General Notes: glucose per pt report yesterday Respiratory Normal work of breathing on room air.. General Notes: 04/22/2022: Today, the skin around his wound is in significantly improved condition. His rash is resolving. He does have some heaped up senescent skin around the wound margin and a layer of slough on the wound surface, but there is no concern for active infection. Integumentary (Hair, Skin) Wound #1 status is Open. Original cause of wound was Pressure Injury. The  date acquired was: 07/29/2021. The wound has been in treatment 14 weeks. The wound is located on the Sacrum. The wound measures 2cm length x 1.9cm width x 1.3cm depth; 2.985cm^2 area and 3.88cm^3 volume. There is Fat Layer (Subcutaneous Tissue) exposed. There is no tunneling noted, however, there is undermining starting at 12:00 and ending at 6:00 with a maximum distance of 1.4cm. There is a medium amount of serosanguineous drainage noted. The wound margin is epibole. There is large (67-100%) red, pink, pale granulation within the wound bed. There is a small (1-33%) amount of necrotic tissue within the wound bed including Adherent Slough. 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 There was a Selective/Open Wound Skin/Epidermis Debridement with a total area of 3.8 sq cm performed by [name], MD. With the following instrument(s): Curette to remove Non-Viable tissue/material. Material removed includes [facility] and Skin: Epidermis and after achieving pain control using Lidocaine 4% T opical Solution. No specimens were taken. A time out was conducted at 14:45, prior to the start of the procedure. A Minimum amount of bleeding was  controlled with Pressure. The procedure was tolerated well with a pain level of 0 throughout and a pain level of 0 following the procedure. Post Debridement Measurements: 2cm length x 1.9cm width x 1.3cm depth; 3.88cm^3 volume. Post debridement Stage noted as Category/Stage IV. Character of Wound/Ulcer Post Debridement is improved. Post procedure Diagnosis Wound #1: Same as Pre-Procedure Plan Follow-up Appointments: Return Appointment in 1 week. - Dr. Lucienne Capers with [facility]. 8/23 @ 3:30 pm Anesthetic: Wound #1 Sacrum: (In clinic) Topical Lidocaine 4% applied to wound bed Bathing/ Shower/ Hygiene: May shower with protection but do not get wound dressing(s) wet. Negative Presssure Wound Therapy: Wound #1 Sacrum: Discontinue wound vac - return VAC to [facility] Off-Loading: Gel mattress overlay (Group 1) Turn and reposition every 2 hours Home Health: No change in wound care orders this week; continue Home Health for wound care. May utilize formulary equivalent dressing for wound treatment orders unless otherwise specified. Dressing changes to be completed by [place] on Monday / Wednesday / Friday except when patient has scheduled visit at [facility]. Other Home Health Orders/Instructions: - [facility] WOUND #1: - Sacrum Wound Laterality: Peri-Wound Care: Triamcinolone 15 (g) 3 x Per Week/30 Days Discharge Instructions: Use triamcinolone 15 (g)to rash daily Topical: [facility] antibiotic compound 3 x Per Week/30 Days Discharge Instructions: in base of wound under gauze Prim Dressing: Medline Woven Gauze Sponges 4x4 (in/in) 3 x Per Week/30 Days ary Discharge Instructions: pack into wound, moisten with saline and [facility] compound Secondary Dressing: [facility]PM Excel SAP Bordered Dressing, 7x6.7 (Sacral) (in/in) 3 x Per Week/30 Days Discharge Instructions: Apply silicone border over primary dressing as directed. 04/22/2022: Today, the skin around his wound is in significantly improved  condition. His rash is resolving. He does have some heaped up senescent skin around the wound margin and a layer of slough on the wound surface, but there is no concern for active infection. I used a curette to debride the senescent skin from the orifice of the wound and slough off of the wound surface. We will discontinue to the wound VAC at this time. We will continue to use gauze moistened with his [facility] topical antibiotic compound to pack the wound and a large foam  border dressing. Follow-up in 1 week. Electronic Signature(s) Signed: 04/22/2022 3:08:38 PM By: [name] MD FACS Entered By: [name] on 04/22/2022 15:08:38 -------------------------------------------------------------------------------- HxROS Details Patient Name: Date of Service: [name], [place] 04/22/2022 2:00 PM Medical Record Number: [id number] Patient Account Number: [id number] Date of Birth/Sex: Treating RN: 1966-06-03 (56 y.o. [name] Primary Care Provider: [name] Other Clinician: Referring Provider: Treating Provider/Extender: [name] in Treatment: 14 Information Obtained From Patient Caregiver Chart Eyes Medical History: Negative for: Cataracts; Glaucoma; Optic Neuritis Ear/Nose/Mouth/Throat Medical History: Negative for: Chronic sinus problems/congestion; Middle ear problems Respiratory Medical History: Past Medical History Notes: pulmonary nodules Cardiovascular Medical History: Positive for: Congestive Heart Failure; Hypertension Endocrine Medical History: Positive for: Type II Diabetes Negative for: Type I Diabetes Time with diabetes: since 2004 Treated with: Insulin Blood sugar tested every day: Yes Tested : 4 times per day Genitourinary Medical History: Negative for: End Stage Renal Disease Past Medical History Notes: renal mass, urinary hesitancy Integumentary (Skin) Medical History: Negative for: History of  Burn Musculoskeletal Medical History: Positive for: Osteomyelitis - S1 and coccyx Past Medical History Notes: discitis of thoracic region Oncologic Medical History: Negative for: Received Chemotherapy; Received Radiation Psychiatric Medical History: Positive for: Confinement Anxiety Negative for: Anorexia/bulimia Immunizations Pneumococcal Vaccine: Received Pneumococcal Vaccination: No Implantable Devices No devices added Hospitalization / Surgery History Type of Hospitalization/Surgery bil BKA Family and Social History Cancer: Yes - Father; Diabetes: No; Heart Disease: Yes - Father,Paternal Grandparents; Hereditary Spherocytosis: No; Hypertension: Yes - Father; Kidney Disease: No; Lung Disease: No; Seizures: No; Stroke: No; Thyroid Problems: No; Tuberculosis: No; Never smoker; Marital Status - Divorced; Alcohol Use: Never; Drug Use: No History; Caffeine Use: Moderate - coffee; Financial Concerns: No; Food, Clothing or Shelter Needs: No; Support System Lacking: No; Transportation Concerns: Yes - hurts to transfer Electronic Signature(s) Signed: 04/22/2022 4:09:38 PM By: [name] MD FACS Signed: 04/22/2022 5:58:34 PM By: Baruch Gouty RN, BSN Entered By: [name] on 04/22/2022 14:55:05 -------------------------------------------------------------------------------- [place] Details Patient Name: Date of Service: [name], [name] 04/22/2022 Medical Record Number: [id number] Patient Account Number: [id number] Date of Birth/Sex: Treating RN: 10-17-1965 (56 y.o. [name] Primary Care Provider: [name] Other Clinician: Referring Provider: Treating Provider/Extender: [name] in Treatment: 14 Diagnosis Coding ICD-10 Codes Code Description L89.154 Pressure ulcer of sacral region, stage 4 Z89.512 Acquired absence of left leg below knee Z89.511 Acquired absence of right leg below knee Facility  Procedures CPT4 Code: [id number] Description: 7597 - DEBRIDE WOUND 1ST 20 SQ CM OR < ICD-10 Diagnosis Description L89.154 Pressure ulcer of sacral region, stage 4 Modifier: Quantity: 1 Physician Procedures : CPT4 Code Description Modifier [id number] - WC PHYS LEVEL 4 - EST PT 25 ICD-10 Diagnosis Description L89.154 Pressure ulcer of sacral region, stage 4 Z89.512 Acquired absence of left leg below knee Z89.511 Acquired absence of right leg below knee Quantity: 1 : [id number] - WC PHYS DEBR WO ANESTH 20 SQ CM ICD-10 Diagnosis Description L89.154 Pressure ulcer of sacral region, stage 4 Quantity: 1 Electronic Signature(s) Signed: 04/22/2022 3:08:55 PM By: [name] MD FACS Entered By: [name] on 04/22/2022 15:08:54

## 2022-04-23 NOTE — Assessment & Plan Note (Signed)
We discussed therapy and psych ref- he only willing to consider if its with someone who has been through double amputation trauma Encouraged EMDR.

## 2022-04-23 NOTE — Progress Notes (Addendum)
[name], [name] ([id number]) Visit Report for 04/22/2022 Arrival Information Details Patient Name: Date of Service: [place], [name] 04/22/2022 2:00 PM Medical Record Number: [id number] Patient Account Number: [id number] Date of Birth/Sex: Treating RN: 04-10-1966 (56 y.o. [name] Primary Care Bernetha Anschutz: [name] Other Clinician: Referring Briant Angelillo: Treating Tayte Childers/Extender: [name] in Treatment: 14 Visit Information History Since Last Visit Added or deleted any medications: Yes Patient Arrived: Wheel Chair Any new allergies or adverse reactions: No Arrival Time: 14:26 Had a fall or experienced change in No Accompanied By: friend activities of daily living that may affect Transfer Assistance: None risk of falls: Patient Identification Verified: Yes Signs or symptoms of abuse/neglect since last visito No Secondary Verification Process Completed: Yes Hospitalized since last visit: No Patient Requires Transmission-Based Precautions: No Implantable device outside of the clinic excluding No Patient Has Alerts: No cellular tissue based products placed in the center since last visit: Has Dressing in Place as Prescribed: Yes Pain Present Now: Yes Electronic Signature(s) Signed: 04/22/2022 5:58:34 PM By: Baruch Gouty RN, BSN Entered By: Baruch Gouty on 04/22/2022 14:28:06 -------------------------------------------------------------------------------- Complex / Palliative Patient Assessment Details Patient Name: Date of Service: [name], [name] 04/22/2022 2:00 PM Medical Record Number: [id number] Patient Account Number: [id number] Date of Birth/Sex: Treating RN: 10-May-1966 (56 y.o. [name] Primary Care Shawniece Oyola: [name] Other Clinician: Referring Tully Burgo: Treating Christabell Loseke/Extender: [name] in Treatment: 14 Complex Wound Management Criteria Patient has remarkable or  complex co-morbidities requiring medications or treatments that extend wound healing times. Examples: Diabetes mellitus with chronic renal failure or end stage renal disease requiring dialysis Advanced or poorly controlled rheumatoid arthritis Diabetes mellitus and end stage chronic obstructive pulmonary disease Active cancer with current chemo- or radiation therapy diabetes, CHF, bil AKA Palliative Wound Management Criteria Care Approach Wound Care Plan: Complex Wound Management Electronic Signature(s) Signed: 04/24/2022 3:14:13 PM By: Baruch Gouty RN, BSN Signed: 04/24/2022 4:05:21 PM By: [name] MD FACS Entered By: Baruch Gouty on 04/24/2022 15:14:12 -------------------------------------------------------------------------------- Encounter Discharge Information Details Patient Name: Date of Service: [name], [place] 04/22/2022 2:00 PM Medical Record Number: [id number] Patient Account Number: [id number] Date of Birth/Sex: Treating RN: 1966/05/21 (56 y.o. [name] Primary Care Shakeema Lippman: [name] Other Clinician: Referring Najma Bozarth: Treating Ilia Dimaano/Extender: [name] in Treatment: 14 Encounter Discharge Information Items Post Procedure Vitals Discharge Condition: Stable Temperature (F): 98.4 Ambulatory Status: Wheelchair Pulse (bpm): 99 Discharge Destination: Home Respiratory Rate (breaths/min): 18 Transportation: Private Auto Blood Pressure (mmHg): 135/86 Accompanied By: friend Schedule Follow-up Appointment: Yes Clinical Summary of Care: Patient Declined Electronic Signature(s) Signed: 04/22/2022 5:58:34 PM By: Baruch Gouty RN, BSN Entered By: Baruch Gouty on 04/22/2022 15:04:22 -------------------------------------------------------------------------------- Lower Extremity Assessment Details Patient Name: Date of Service: [name], [place] 04/22/2022 2:00 PM Medical Record Number:  [id number] Patient Account Number: [id number] Date of Birth/Sex: Treating RN: December 01, 1965 (56 y.o. [name] Primary Care Nakota Ackert: [name] Other Clinician: Referring Nakeia Calvi: Treating Crescentia Boutwell/Extender: [name] in Treatment: 14 Electronic Signature(s) Signed: 04/22/2022 5:58:34 PM By: Baruch Gouty RN, BSN Entered By: Baruch Gouty on 04/22/2022 14:31:50 -------------------------------------------------------------------------------- Multi Wound Chart Details Patient Name: Date of Service: [name], [place] 04/22/2022 2:00 PM Medical Record Number: [id number] Patient Account Number: [id number] Date of Birth/Sex: Treating RN: 04-03-1966 (56 y.o. [name] Primary Care Desire Fulp: [name] Other Clinician: Referring Refoel Palladino: Treating Sweetie Giebler/Extender: [name] in Treatment: 14 Vital Signs Height(in): Capillary Blood  Glucose(mg/dl): 175 Weight(lbs): Pulse(bpm): 99 Body Mass Index(BMI): Blood Pressure(mmHg): 135/86 Temperature(F): 98.4 Respiratory Rate(breaths/min): 18 Photos: [N/A:N/A] Sacrum N/A N/A Wound Location: Pressure Injury N/A N/A Wounding Event: Pressure Ulcer N/A N/A Primary Etiology: Congestive Heart Failure, N/A N/A Comorbid History: Hypertension, Type II Diabetes, Osteomyelitis, Confinement Anxiety 07/29/2021 N/A N/A Date Acquired: 14 N/A N/A Weeks of Treatment: Open N/A N/A Wound Status: No N/A N/A Wound Recurrence: 2x1.9x1.3 N/A N/A Measurements L x W x D (cm) 2.985 N/A N/A A (cm) : rea 3.88 N/A N/A Volume (cm) : 49.30% N/A N/A % Reduction in A rea: 40.10% N/A N/A % Reduction in Volume: 12 Starting Position 1 (o'clock): 6 Ending Position 1 (o'clock): 1.4 Maximum Distance 1 (cm): Yes N/A N/A Undermining: Category/Stage IV N/A N/A Classification: Medium N/A N/A Exudate A mount: Serosanguineous N/A N/A Exudate  Type: red, brown N/A N/A Exudate Color: Epibole N/A N/A Wound Margin: Large (67-100%) N/A N/A Granulation A mount: Red, Pink, Pale N/A N/A Granulation Quality: Small (1-33%) N/A N/A Necrotic A mount: Fat Layer (Subcutaneous Tissue): Yes N/A N/A Exposed Structures: Fascia: No Tendon: No Muscle: No Joint: No Bone: No Small (1-33%) N/A N/A Epithelialization: Debridement - Selective/Open Wound N/A N/A Debridement: Pre-procedure Verification/Time Out 14:45 N/A N/A Taken: Lidocaine 4% Topical Solution N/A N/A Pain Control: Slough N/A N/A Tissue Debrided: Skin/Epidermis N/A N/A Level: 3.8 N/A N/A Debridement A (sq cm): rea Curette N/A N/A Instrument: Minimum N/A N/A Bleeding: Pressure N/A N/A Hemostasis A chieved: 0 N/A N/A Procedural Pain: 0 N/A N/A Post Procedural Pain: Procedure was tolerated well N/A N/A Debridement Treatment Response: 2x1.9x1.3 N/A N/A Post Debridement Measurements L x W x D (cm) 3.88 N/A N/A Post Debridement Volume: (cm) Category/Stage IV N/A N/A Post Debridement Stage: Debridement N/A N/A Procedures Performed: Treatment Notes Electronic Signature(s) Signed: 04/22/2022 2:54:11 PM By: [name] MD FACS Signed: 04/22/2022 5:58:34 PM By: Baruch Gouty RN, BSN Entered By: [name] on 04/22/2022 14:54:10 -------------------------------------------------------------------------------- Multi-Disciplinary Care Plan Details Patient Name: Date of Service: [name], [name] 04/22/2022 2:00 PM Medical Record Number: [id number] Patient Account Number: [id number] Date of Birth/Sex: Treating RN: 05-04-1966 (56 y.o. [name] Primary Care Annelyse Rey: [name] Other Clinician: Referring Tung Pustejovsky: Treating Lavonya Hoerner/Extender: [name] in Treatment: [place] reviewed with physician Active Inactive Nutrition Nursing Diagnoses: Impaired glucose control: actual  or potential Potential for alteratiion in Nutrition/Potential for imbalanced nutrition Goals: Patient/caregiver will maintain therapeutic glucose control Date Initiated: 01/08/2022 Target Resolution Date: 04/30/2022 Goal Status: Active Interventions: Assess HgA1c results as ordered upon admission and as needed Assess patient nutrition upon admission and as needed per policy Provide education on elevated blood sugars and impact on wound healing Treatment Activities: Dietary management education, guidance and counseling : 01/08/2022 Patient referred to Primary Care Physician for further nutritional evaluation : 01/08/2022 Notes: Pressure Nursing Diagnoses: Knowledge deficit related to causes and risk factors for pressure ulcer development Knowledge deficit related to management of pressures ulcers Potential for impaired tissue integrity related to pressure, friction, moisture, and shear Goals: Patient/caregiver will verbalize understanding of pressure ulcer management Date Initiated: 01/08/2022 Target Resolution Date: 04/30/2022 Goal Status: Active Interventions: Assess: immobility, friction, shearing, incontinence upon admission and as needed Assess offloading mechanisms upon admission and as needed Assess potential for pressure ulcer upon admission and as needed Notes: Wound/Skin Impairment Nursing Diagnoses: Impaired tissue integrity Knowledge deficit related to ulceration/compromised skin integrity Goals: Patient/caregiver will verbalize understanding of skin care regimen Date Initiated: 01/08/2022 Target Resolution Date: 04/30/2022  Goal Status: Active Ulcer/skin breakdown will have a volume reduction of 30% by week 4 Date Initiated: 01/08/2022 Date Inactivated: 02/18/2022 Target Resolution Date: 02/05/2022 Goal Status: Unmet Unmet Reason: VAC leaking Ulcer/skin breakdown will have a volume reduction of 50% by week 8 Date Initiated: 02/18/2022 Date Inactivated: 03/04/2022 Target  Resolution Date: 03/05/2022 Goal Status: Unmet Unmet Reason: infection Ulcer/skin breakdown will have a volume reduction of 80% by week 12 Date Initiated: 03/04/2022 Date Inactivated: 04/01/2022 Target Resolution Date: 04/02/2022 Goal Status: Unmet Unmet Reason: too much moisture Interventions: Assess patient/caregiver ability to obtain necessary supplies Assess patient/caregiver ability to perform ulcer/skin care regimen upon admission and as needed Assess ulceration(s) every visit Provide education on ulcer and skin care Treatment Activities: Skin care regimen initiated : 01/08/2022 Topical wound management initiated : 01/08/2022 Notes: Electronic Signature(s) Signed: 04/22/2022 5:58:34 PM By: Baruch Gouty RN, BSN Entered By: Baruch Gouty on 04/22/2022 14:42:08 -------------------------------------------------------------------------------- Pain Assessment Details Patient Name: Date of Service: [name], [place] 04/22/2022 2:00 PM Medical Record Number: [id number] Patient Account Number: [id number] Date of Birth/Sex: Treating RN: 02/07/1966 (56 y.o. [name] Primary Care Mammie Meras: [name] Other Clinician: Referring Lelani Garnett: Treating Dezirea Mccollister/Extender: [name] in Treatment: 14 Active Problems Location of Pain Severity and Description of Pain Patient Has Paino Yes Site Locations Pain Location: Generalized Pain With Dressing Change: No Duration of the Pain. Constant / Intermittento Constant Rate the pain. Current Pain Level: 8 Character of Pain Describe the Pain: Aching Pain Management and Medication Current Pain Management: Medication: Yes Is the Current Pain Management Adequate: Adequate How does your wound impact your activities of daily livingo Sleep: No Bathing: No Appetite: No Relationship With Others: No Bladder Continence: No Emotions: Yes Bowel Continence: No Work: No Toileting: No Drive:  No Dressing: No Hobbies: No Notes reports chronic lower back pain Electronic Signature(s) Signed: 04/22/2022 5:58:34 PM By: Baruch Gouty RN, BSN Entered By: Baruch Gouty on 04/22/2022 14:31:42 -------------------------------------------------------------------------------- Patient/Caregiver Education Details Patient Name: Date of Service: [name], [name] 8/16/2023andnbsp2:00 PM Medical Record Number: [id number] Patient Account Number: [id number] Date of Birth/Gender: Treating RN: 05/08/1966 (56 y.o. [name] Primary Care Physician: [name] Other Clinician: Referring Physician: Treating Physician/Extender: [name] in Treatment: 14 Education Assessment Education Provided To: Patient Education Topics Provided Offloading: Methods: Explain/Verbal Responses: Reinforcements needed, State content correctly Wound/Skin Impairment: Methods: Explain/Verbal Responses: Reinforcements needed, State content correctly Electronic Signature(s) Signed: 04/22/2022 5:58:34 PM By: Baruch Gouty RN, BSN Entered By: Baruch Gouty on 04/22/2022 14:42:39 -------------------------------------------------------------------------------- Wound Assessment Details Patient Name: Date of Service: [name], [name] 04/22/2022 2:00 PM Medical Record Number: [id number] Patient Account Number: [id number] Date of Birth/Sex: Treating RN: 10-22-1965 (56 y.o. [name] Primary Care Jesstin Studstill: [name] Other Clinician: Referring Victorian Gunn: Treating Giankarlo Leamer/Extender: [name] in Treatment: 14 Wound Status Wound Number: 1 Primary Pressure Ulcer Etiology: Wound Location: Sacrum Wound Open Wounding Event: Pressure Injury Status: Date Acquired: 07/29/2021 Comorbid Congestive Heart Failure, Hypertension, Type II Diabetes, Weeks Of Treatment: 14 History: Osteomyelitis, Confinement  Anxiety Clustered Wound: No Photos Wound Measurements Length: (cm) 2 Width: (cm) 1.9 Depth: (cm) 1.3 Area: (cm) 2.985 Volume: (cm) 3.88 % Reduction in Area: 49.3% % Reduction in Volume: 40.1% Epithelialization: Small (1-33%) Tunneling: No Undermining: Yes Starting Position (o'clock): 12 Ending Position (o'clock): 6 Maximum Distance: (cm) 1.4 Wound Description Classification: Category/Stage IV Wound Margin: Epibole Exudate Amount: Medium Exudate Type: Serosanguineous Exudate Color: red, brown Foul Odor After Cleansing: No  Slough/Fibrino Yes Wound Bed Granulation Amount: Large (67-100%) Exposed Structure Granulation Quality: Red, Pink, Pale Fascia Exposed: No Necrotic Amount: Small (1-33%) Fat Layer (Subcutaneous Tissue) Exposed: Yes Necrotic Quality: Adherent Slough Tendon Exposed: No Muscle Exposed: No Joint Exposed: No Bone Exposed: No Treatment Notes Wound #1 (Sacrum) Cleanser Peri-Wound Care Triamcinolone 15 (g) Discharge Instruction: Use triamcinolone 15 (g)to rash daily Topical Keystone antibiotic compound Discharge Instruction: in base of wound under gauze Primary Dressing Medline Woven Gauze Sponges 4x4 (in/in) Discharge Instruction: pack into wound, moisten with saline and Keystone compound Secondary Dressing MPM Excel SAP Bordered Dressing, 7x6.7 (Sacral) (in/in) Discharge Instruction: Apply silicone border over primary dressing as directed. Secured With Compression Wrap Compression Stockings Environmental education officer) Signed: 04/22/2022 5:58:34 PM By: Baruch Gouty RN, BSN Entered By: Baruch Gouty on 04/22/2022 14:40:12 -------------------------------------------------------------------------------- Vitals Details Patient Name: Date of Service: [name], [name] 04/22/2022 2:00 PM Medical Record Number: [id number] Patient Account Number: [id number] Date of Birth/Sex: Treating RN: 09/26/1965 (56 y.o. [name] Primary Care  Antonia Jicha: [name] Other Clinician: Referring Marijose Curington: Treating Devian Bartolomei/Extender: [name] in Treatment: 14 Vital Signs Time Taken: 14:29 Temperature (F): 98.4 Pulse (bpm): 99 Respiratory Rate (breaths/min): 18 Blood Pressure (mmHg): 135/86 Capillary Blood Glucose (mg/dl): 175 Reference Range: 80 - 120 mg / dl Notes glucose per pt report yesterday Electronic Signature(s) Signed: 04/22/2022 5:58:34 PM By: Baruch Gouty RN, BSN Entered By: Baruch Gouty on 04/22/2022 14:30:46

## 2022-04-23 NOTE — Progress Notes (Addendum)
[name] is a 56 y.o. male who presents today for an office visit.  Assessment/Plan:  Overview: It was a pleasure to meet [name] and accept into primary care mgmt.  Considering the severity of his chronic illnesses I believe he is a palliative care patient and have added this to his problem list as he is DNR with suspected renal cell tumor bilateral amputee.  He understands the risks and benefits of continued controlled substance treatment and the interactions and accepts those risk.  As such, I have no reservations about continuing his chronic testosterone, while pain mgmt continues opioids.   He has severe PTSD from the amputations and sacral decubitus, so I encourage psych/bh/EMDR but he only willing to consider meeting with others who have been through similar levels of trauma - but anyone else with similar levels of trauma is likely not in practice anymore.  [name] was seen today for establish care.  Right below-elbow amputee Overview: 2017 path shows no associated osteomyelitis although surgeon removed under that concern.    Amputation below knee [facility])  Renal mass Overview: Followed by urology, concern for RCC but patient declines surgery.  Assessment & Plan: Never smoked  F/w urology Avoiding biopsy for now due to bosniak level 3   Stage IV pressure ulcer of sacral region [facility]) Overview: -Noted to be infected during previous hospitalization on 08/14/2021 and was treated with a total of 7 days of cefepime, he was receiving Hydro therapy. -Wound care and general surgery consulted, no need for surgical debridement as wound appears fairly good.  Continue local care per wound with twice daily saline dressing and collagenase.  Continue IV Dilaudid with wound manipulation since it is extremely painful -He plans to go home and have family take care of the wound - L5-S1 osteomyelitis noted on CT spine; history of amputatiion showing acute osteomyelitis.  History of diabetic  foot ulcer prior has since resolved per patinet - follows with surgical wound mgmt.    PTSD (post-traumatic stress disorder) Assessment & Plan: We discussed therapy and psych ref- he only willing to consider if its with someone who has been through double amputation trauma Encouraged EMDR.   Psychophysiological insomnia -     Zolpidem Tartrate; TAKE 1 TABLET BY MOUTH AT BEDTIME AS NEEDED FOR  SLEEP.  TAKE  1  HOUR  PRIOR  TO  BEDTIME.  Dispense: 90 tablet; Refill: 1  Palliative care status Overview: DNR Bilateral bka Spinal disease beyond h/o sacral decubitus and osteomyelitis spine.  Chronic intractable pain       Return in about 3 months (around 07/24/2022) for testosterone management. cdm.     Subjective:  HPI:  Today's visit was a transfer of care from [facility] and he just wanted to get to know me and share his medical history with me which is quite extensive.  We reviewed all of the problems above and I updated the overview and assessment and plan sections in accordance with our discussion.  He will need me to take over testosterone prescribing but he has enough for 3 more months so he will return in 3 months for that.  Also he has been on Ambien since June 2023  from [facility] and doesn't want to continue that anymore.   Addendum 04/28/22: patient contacted me to clarify he had only been on ambien since june2023 and doesn't want to continue, and that he never said he doesn't expect to live long- he only clarified that he is DNR,  so this note was modified to correct prior misunderstandings/errors in this regard.     Objective:  Physical Exam: BP 130/78 (BP Location: Left Arm)   Pulse (!) 103   Temp 98.7 F (37.1 C) (Temporal)   Ht '6\' 3"'$  (1.905 m)   SpO2 94%   BMI 26.00 kg/m     This is a polite, friendly, and genuine person who was a pleasure to meet.  Constitutional: NAD, AAO, not ill-appearing  Skin: warm, dry, no lesions of concern Neuro:  alert, no focal deficit obvious, articulate speech Psych: normal mood, behavior, thought content   Problem specific physical exam findings:  Bilateral amputation bka with prosthetic.  Fairly muscular but old pictures noted showing him much more muscular.  Silver beard well groomed.       Loralee Pacas, MD 04/23/2022 3:44 PM

## 2022-04-29 ENCOUNTER — Encounter (HOSPITAL_BASED_OUTPATIENT_CLINIC_OR_DEPARTMENT_OTHER): Payer: Medicare HMO | Admitting: General Surgery

## 2022-04-29 DIAGNOSIS — E119 Type 2 diabetes mellitus without complications: Secondary | ICD-10-CM | POA: Diagnosis not present

## 2022-04-29 DIAGNOSIS — L89154 Pressure ulcer of sacral region, stage 4: Secondary | ICD-10-CM | POA: Diagnosis not present

## 2022-04-29 DIAGNOSIS — Z89512 Acquired absence of left leg below knee: Secondary | ICD-10-CM | POA: Diagnosis not present

## 2022-04-29 DIAGNOSIS — I110 Hypertensive heart disease with heart failure: Secondary | ICD-10-CM | POA: Diagnosis not present

## 2022-04-29 DIAGNOSIS — G8929 Other chronic pain: Secondary | ICD-10-CM | POA: Diagnosis not present

## 2022-04-29 DIAGNOSIS — Z89511 Acquired absence of right leg below knee: Secondary | ICD-10-CM | POA: Diagnosis not present

## 2022-04-29 DIAGNOSIS — I509 Heart failure, unspecified: Secondary | ICD-10-CM | POA: Diagnosis not present

## 2022-04-29 NOTE — Progress Notes (Signed)
[name], [name] ([id number]) Visit Report for 04/29/2022 Arrival Information Details Patient Name: Date of Service: [name], [name] 04/29/2022 3:30 PM Medical Record Number: [id number] Patient Account Number: [id number] Date of Birth/Sex: Treating RN: 06-25-1966 (56 y.o. [name] Primary Care [address] [place]: [place] Delphina Cahill Other Clinician: Referring Arvetta Araque: Treating Rihan Schueler/Extender: [name] in Treatment: 15 Visit Information History Since Last Visit Added or deleted any medications: No Patient Arrived: Wheel Chair Any new allergies or adverse reactions: No Arrival Time: 15:32 Had a fall or experienced change in No Accompanied By: friend activities of daily living that may affect Transfer Assistance: None risk of falls: Patient Identification Verified: Yes Signs or symptoms of abuse/neglect since last visito No Secondary Verification Process Completed: Yes Hospitalized since last visit: No Patient Requires Transmission-Based Precautions: No Implantable device outside of the clinic excluding No Patient Has Alerts: No cellular tissue based products placed in the center since last visit: Has Dressing in Place as Prescribed: Yes Pain Present Now: Yes Electronic Signature(s) Signed: 04/29/2022 6:18:08 PM By: Baruch Gouty RN, BSN Entered By: Baruch Gouty on 04/29/2022 15:35:20 -------------------------------------------------------------------------------- Encounter Discharge Information Details Patient Name: Date of Service: [name], [name] 04/29/2022 3:30 PM Medical Record Number: [id number] Patient Account Number: [id number] Date of Birth/Sex: Treating RN: Apr 24, 1966 (56 y.o. [name] Primary Care Cordarrell Sane: [place] Delphina Cahill Other Clinician: Referring Elmo Rio: Treating Teeghan Hammer/Extender: [name] in Treatment: 15 Encounter Discharge Information Items Post Procedure Vitals Discharge  Condition: Stable Temperature (F): 97.9 Ambulatory Status: Wheelchair Pulse (bpm): 98 Discharge Destination: Home Respiratory Rate (breaths/min): 18 Transportation: Private Auto Blood Pressure (mmHg): 155/93 Accompanied By: friend Schedule Follow-up Appointment: Yes Clinical Summary of Care: Patient Declined Electronic Signature(s) Signed: 04/29/2022 6:18:08 PM By: Baruch Gouty RN, BSN Entered By: Baruch Gouty on 04/29/2022 16:59:34 -------------------------------------------------------------------------------- Lower Extremity Assessment Details Patient Name: Date of Service: [name], [name] 04/29/2022 3:30 PM Medical Record Number: [id number] Patient Account Number: [id number] Date of Birth/Sex: Treating RN: [birth year] (56 y.o. [name] Primary Care Ahmani Daoud: [place] Delphina Cahill Other Clinician: Referring Collin Rengel: Treating Mariel Lukins/Extender: [name] in Treatment: 15 Electronic Signature(s) Signed: 04/29/2022 6:18:08 PM By: Baruch Gouty RN, BSN Entered By: Baruch Gouty on 04/29/2022 15:37:05 -------------------------------------------------------------------------------- Multi Wound Chart Details Patient Name: Date of Service: [name], [name] 04/29/2022 3:30 PM Medical Record Number: [id number] Patient Account Number: [id number] Date of Birth/Sex: Treating RN: 11-20-1965 (56 y.o. [name] Primary Care Rosalyn Archambault: [place] Delphina Cahill Other Clinician: Referring Staphanie [name]: Treating Nathan Moctezuma/Extender: [name] in Treatment: 15 Vital Signs Height(in): Capillary Blood Glucose(mg/dl): 185 Weight(lbs): Pulse(bpm): 98 Body Mass Index(BMI): Blood Pressure(mmHg): 155/93 Temperature(F): 97.9 Respiratory Rate(breaths/min): 18 Photos: [N/A:N/A] Sacrum N/A N/A Wound Location: Pressure Injury N/A N/A Wounding Event: Pressure Ulcer N/A N/A Primary Etiology: Congestive Heart  Failure, N/A N/A Comorbid History: Hypertension, Type II Diabetes, Osteomyelitis, Confinement Anxiety 07/29/2021 N/A N/A Date Acquired: 15 N/A N/A Weeks of Treatment: Open N/A N/A Wound Status: No N/A N/A Wound Recurrence: 1.7x1.7x1.1 N/A N/A Measurements L x W x D (cm) 2.27 N/A N/A A (cm) : rea 2.497 N/A N/A Volume (cm) : 61.50% N/A N/A % Reduction in A rea: 61.50% N/A N/A % Reduction in Volume: 12 Starting Position 1 (o'clock): 6 Ending Position 1 (o'clock): 1.6 Maximum Distance 1 (cm): Yes N/A N/A Undermining: Category/Stage IV N/A N/A Classification: Medium N/A N/A Exudate A mount: Serosanguineous N/A N/A Exudate Type: red,  brown N/A N/A Exudate Color: Epibole N/A N/A Wound Margin: Large (67-100%) N/A N/A Granulation Amount: Red, Pink, Pale N/A N/A Granulation Quality: Small (1-33%) N/A N/A Necrotic Amount: Fat Layer (Subcutaneous Tissue): Yes N/A N/A Exposed Structures: Fascia: No Tendon: No Muscle: No Joint: No Bone: No Small (1-33%) N/A N/A Epithelialization: Debridement - Selective/Open Wound N/A N/A Debridement: Pre-procedure Verification/Time Out 15:50 N/A N/A Taken: Lidocaine 4% Topical Solution N/A N/A Pain Control: Callus N/A N/A Tissue Debrided: Skin/Epidermis N/A N/A Level: 4 N/A N/A Debridement A (sq cm): rea Curette N/A N/A Instrument: Minimum N/A N/A Bleeding: Pressure N/A N/A Hemostasis A chieved: 0 N/A N/A Procedural Pain: 0 N/A N/A Post Procedural Pain: Procedure was tolerated well N/A N/A Debridement Treatment Response: 1.7x1.7x1.1 N/A N/A Post Debridement Measurements L x W x D (cm) 2.497 N/A N/A Post Debridement Volume: (cm) Category/Stage IV N/A N/A Post Debridement Stage: Debridement N/A N/A Procedures Performed: Treatment Notes Electronic Signature(s) Signed: 04/29/2022 4:33:24 PM By: [name] MD FACS Signed: 04/29/2022 6:18:08 PM By: Baruch Gouty RN, BSN Entered By: [name]  on 04/29/2022 16:33:23 -------------------------------------------------------------------------------- Multi-Disciplinary Care Plan Details Patient Name: Date of Service: [name], [name] 04/29/2022 3:30 PM Medical Record Number: [id number] Patient Account Number: [id number] Date of Birth/Sex: Treating RN: March 10, 1966 (56 y.o. [name] Primary Care [place]: [place] Delphina Cahill Other Clinician: Referring Tanishka Drolet: Treating Brodan Grewell/Extender: [name] in Treatment: 15 Multidisciplinary Care Plan reviewed with physician Active Inactive Nutrition Nursing Diagnoses: Impaired glucose control: actual or potential Potential for alteratiion in Nutrition/Potential for imbalanced nutrition Goals: Patient/caregiver will maintain therapeutic glucose control Date Initiated: 01/08/2022 Target Resolution Date: 05/28/2022 Goal Status: Active Interventions: Assess HgA1c results as ordered upon admission and as needed Assess patient nutrition upon admission and as needed per policy Provide education on elevated blood sugars and impact on wound healing Treatment Activities: Dietary management education, guidance and counseling : 01/08/2022 Patient referred to Primary Care Physician for further nutritional evaluation : 01/08/2022 Notes: Pressure Nursing Diagnoses: Knowledge deficit related to causes and risk factors for pressure ulcer development Knowledge deficit related to management of pressures ulcers Potential for impaired tissue integrity related to pressure, friction, moisture, and shear Goals: Patient/caregiver will verbalize understanding of pressure ulcer management Date Initiated: 01/08/2022 Target Resolution Date: 05/28/2022 Goal Status: Active Interventions: Assess: immobility, friction, shearing, incontinence upon admission and as needed Assess offloading mechanisms upon admission and as needed Assess potential for pressure ulcer upon admission  and as needed Notes: Wound/Skin Impairment Nursing Diagnoses: Impaired tissue integrity Knowledge deficit related to ulceration/compromised skin integrity Goals: Patient/caregiver will verbalize understanding of skin care regimen Date Initiated: 01/08/2022 Target Resolution Date: 05/28/2022 Goal Status: Active Ulcer/skin breakdown will have a volume reduction of 30% by week 4 Date Initiated: 01/08/2022 Date Inactivated: 02/18/2022 Target Resolution Date: 02/05/2022 Goal Status: Unmet Unmet Reason: VAC leaking Ulcer/skin breakdown will have a volume reduction of 50% by week 8 Date Initiated: 02/18/2022 Date Inactivated: 03/04/2022 Target Resolution Date: 03/05/2022 Goal Status: Unmet Unmet Reason: infection Ulcer/skin breakdown will have a volume reduction of 80% by week 12 Date Initiated: 03/04/2022 Date Inactivated: 04/01/2022 Target Resolution Date: 04/02/2022 Goal Status: Unmet Unmet Reason: too much moisture Interventions: Assess patient/caregiver ability to obtain necessary supplies Assess patient/caregiver ability to perform ulcer/skin care regimen upon admission and as needed Assess ulceration(s) every visit Provide education on ulcer and skin care Treatment Activities: Skin care regimen initiated : 01/08/2022 Topical wound management initiated : 01/08/2022 Notes: Electronic Signature(s) Signed: 04/29/2022 6:18:08 PM  By: Baruch Gouty RN, BSN Entered By: Baruch Gouty on 04/29/2022 15:48:52 -------------------------------------------------------------------------------- Pain Assessment Details Patient Name: Date of Service: [name], [name] 04/29/2022 3:30 PM Medical Record Number: [id number] Patient Account Number: [id number] Date of Birth/Sex: Treating RN: [birth year] (56 y.o. [name] Primary Care Nandana Krolikowski: [place] Delphina Cahill Other Clinician: Referring Leilynn Pilat: Treating Thatiana Renbarger/Extender: [name] in Treatment: 15 Active  Problems Location of Pain Severity and Description of Pain Patient Has Paino Yes Site Locations Pain Location: Generalized Pain, Pain in Ulcers Duration of the Pain. Constant / Intermittento Constant Rate the pain. Current Pain Level: 5 Worst Pain Level: 7 Least Pain Level: 5 Character of Pain Describe the Pain: Aching, Stabbing Pain Management and Medication Current Pain Management: Medication: Yes Other: reposition Is the Current Pain Management Adequate: Adequate How does your wound impact your activities of daily livingo Sleep: Yes Bathing: No Appetite: No Relationship With Others: No Bladder Continence: No Emotions: No Bowel Continence: No Work: No Toileting: No Drive: No Dressing: No Hobbies: No Electronic Signature(s) Signed: 04/29/2022 6:18:08 PM By: Baruch Gouty RN, BSN Entered By: Baruch Gouty on 04/29/2022 15:36:59 -------------------------------------------------------------------------------- Patient/Caregiver Education Details Patient Name: Date of Service: [name], [name] 8/23/2023andnbsp3:30 PM Medical Record Number: [id number] Patient Account Number: [id number] Date of Birth/Gender: Treating RN: 07-08-1966 (56 y.o. [name] Primary Care Physician: [place] Delphina Cahill Other Clinician: Referring Physician: Treating Physician/Extender: [name] in Treatment: 15 Education Assessment Education Provided To: Patient Education Topics Provided Pressure: Methods: Explain/Verbal Responses: Reinforcements needed, State content correctly Wound/Skin Impairment: Methods: Explain/Verbal Responses: Reinforcements needed, State content correctly Electronic Signature(s) Signed: 04/29/2022 6:18:08 PM By: Baruch Gouty RN, BSN Entered By: Baruch Gouty on 04/29/2022 15:49:11 -------------------------------------------------------------------------------- Wound Assessment Details Patient Name: Date of  Service: [name], [name] 04/29/2022 3:30 PM Medical Record Number: [id number] Patient Account Number: [id number] Date of Birth/Sex: Treating RN: Feb 04, 1966 (56 y.o. [name] Primary Care Aarti Mankowski: [place] Delphina Cahill Other Clinician: Referring Lyndol Vanderheiden: Treating Levita Monical/Extender: [name] in Treatment: 15 Wound Status Wound Number: 1 Primary Pressure Ulcer Etiology: Wound Location: Sacrum Wound Open Wounding Event: Pressure Injury Status: Date Acquired: 07/29/2021 Comorbid Congestive Heart Failure, Hypertension, Type II Diabetes, Weeks Of Treatment: 15 History: Osteomyelitis, Confinement Anxiety Clustered Wound: No Photos Wound Measurements Length: (cm) 1.7 Width: (cm) 1.7 Depth: (cm) 1.1 Area: (cm) 2.27 Volume: (cm) 2.497 % Reduction in Area: 61.5% % Reduction in Volume: 61.5% Epithelialization: Small (1-33%) Tunneling: No Undermining: Yes Starting Position (o'clock): 12 Ending Position (o'clock): 6 Maximum Distance: (cm) 1.6 Wound Description Classification: Category/Stage IV Wound Margin: Epibole Exudate Amount: Medium Exudate Type: Serosanguineous Exudate Color: red, brown Foul Odor After Cleansing: No Slough/Fibrino Yes Wound Bed Granulation Amount: Large (67-100%) Exposed Structure Granulation Quality: Red, Pink, Pale Fascia Exposed: No Necrotic Amount: Small (1-33%) Fat Layer (Subcutaneous Tissue) Exposed: Yes Tendon Exposed: No Muscle Exposed: No Joint Exposed: No Bone Exposed: No Treatment Notes Wound #1 (Sacrum) Cleanser Peri-Wound Care Triamcinolone 15 (g) Discharge Instruction: Use triamcinolone 15 (g)to rash daily as needed Zinc Oxide Ointment 30g tube Discharge Instruction: Apply Zinc Oxide to macerated periwound with each dressing change Topical Keystone antibiotic compound Discharge Instruction: in base of wound under gauze Primary Dressing Medline Woven Gauze Sponges 4x4 (in/in) Discharge  Instruction: pack into wound, moisten with saline and Keystone compound Secondary Dressing MPM Excel SAP Bordered Dressing, 7x6.7 (Sacral) (in/in) Discharge Instruction: Apply silicone border over primary dressing as directed. Secured  With Compression Wrap Compression Stockings Add-Ons Electronic Signature(s) Signed: 04/29/2022 6:18:08 PM By: Baruch Gouty RN, BSN Entered By: Baruch Gouty on 04/29/2022 15:48:20 -------------------------------------------------------------------------------- Vitals Details Patient Name: Date of Service: [name], [name] 04/29/2022 3:30 PM Medical Record Number: [id number] Patient Account Number: [id number] Date of Birth/Sex: Treating RN: May 02, 1966 (56 y.o. [name] Primary Care Wilgus [name]: [place] Delphina Cahill Other Clinician: Referring Kerim Statzer: Treating Karlie Aung/Extender: [name] in Treatment: 15 Vital Signs Time Taken: 15:35 Temperature (F): 97.9 Pulse (bpm): 98 Respiratory Rate (breaths/min): 18 Blood Pressure (mmHg): 155/93 Capillary Blood Glucose (mg/dl): 185 Reference Range: 80 - 120 mg / dl Notes glucose per pt report this am Electronic Signature(s) Signed: 04/29/2022 6:18:08 PM By: Baruch Gouty RN, BSN Entered By: Baruch Gouty on 04/29/2022 15:36:01

## 2022-04-29 NOTE — Progress Notes (Signed)
[name] ([id number]) Visit Report for 04/29/2022 Chief Complaint Document Details Patient Name: Date of Service: [place], [name] 04/29/2022 3:30 PM Medical Record Number: [id number] Patient Account Number: [id number] Date of Birth/Sex: Treating RN: Nov 20, 1965 (56 y.o. [name] Primary Care Provider: MO Delphina Cahill Other Clinician: Referring Provider: Treating Provider/Extender: [name] in Treatment: 15 Information Obtained from: Patient Chief Complaint Patient is at the clinic for treatment of an open pressure ulcer Electronic Signature(s) Signed: 04/29/2022 4:33:30 PM By: [name] MD FACS Entered By: [name] on 04/29/2022 16:33:29 -------------------------------------------------------------------------------- Debridement Details Patient Name: Date of Service: [name], [place] 04/29/2022 3:30 PM Medical Record Number: [id number] Patient Account Number: [id number] Date of Birth/Sex: Treating RN: 1966-05-08 (56 y.o. [name] Primary Care Provider: MO Delphina Cahill Other Clinician: Referring Provider: Treating Provider/Extender: [name] in Treatment: 15 Debridement Performed for Assessment: Wound #1 Sacrum Performed By: Physician [name], MD Debridement Type: Debridement Level of Consciousness (Pre-procedure): Awake and Alert Pre-procedure Verification/Time Out Yes - 15:50 Taken: Start Time: 15:52 Pain Control: Lidocaine 4% Topical Solution T Area Debrided (L x W): otal 2 (cm) x 2 (cm) = 4 (cm) Tissue and other material debrided: Non-Viable, Callus, Skin: Epidermis Level: Skin/Epidermis Debridement Description: Selective/Open Wound Instrument: Curette Bleeding: Minimum Hemostasis Achieved: Pressure Procedural Pain: 0 Post Procedural Pain: 0 Response to Treatment: Procedure was tolerated well Level of Consciousness (Post- Awake and  Alert procedure): Post Debridement Measurements of Total Wound Length: (cm) 1.7 Stage: Category/Stage IV Width: (cm) 1.7 Depth: (cm) 1.1 Volume: (cm) 2.497 Character of Wound/Ulcer Post Debridement: Improved Post Procedure Diagnosis Same as Pre-procedure Electronic Signature(s) Signed: 04/29/2022 4:55:45 PM By: [name] MD FACS Signed: 04/29/2022 6:18:08 PM By: Baruch Gouty RN, BSN Entered By: Baruch Gouty on 04/29/2022 15:55:02 -------------------------------------------------------------------------------- HPI Details Patient Name: Date of Service: [name], [place] 04/29/2022 3:30 PM Medical Record Number: [id number] Patient Account Number: [id number] Date of Birth/Sex: Treating RN: 01/15/1966 (56 y.o. [name] Primary Care Provider: [place] Delphina Cahill Other Clinician: Referring Provider: Treating Provider/Extender: [name] in Treatment: 15 History of Present Illness HPI Description: ADMISSION 01/08/2022 This is a 56 year old male with a past medical history notable for type 2 diabetes mellitus (last A1c was 8.6) congestive heart failure, hypertension, chronic pain, and bilateral below-knee amputations. His most recent amputation was in November 2022. While he was in the hospital, he developed a sacral pressure ulcer. He was subsequently in a skilled nursing facility for some time. He was discharged with home health and had been in a wound VAC, but was then admitted to the hospital last week when the wound appeared to be worsening. Apparently the periwound skin was macerated and the device that he had been using was leaking quite a bit. Evaluation while in the hospital included a consultation with infectious disease who did not think he had any evidence of osteomyelitis, plastic surgery who felt that he was not an appropriate flap candidate (their note also states that he is not interested in a flap) and wound care who took him  out of the wound VAC and initiated wet-to-dry dressing changes. He has a new wound VAC from [facility] on order, anticipated delivery today. The wound itself is fairly small and isolated to the sacrum. There is muscle exposed. No bone is appreciated but it is palpable beneath the surface. The muscle itself is bit pale and there is heaped up  epibole around the wound edges. No significant odor or drainage. 01/14/2022: His wound VAC was not initiated until this past Friday. He has not had any issues with the VAC but today we found that the bridge foam was applied directly to the skin rather than over a layer of adhesive drape. His home health nurse also requested that we consider applying silver collagen to the wound bed in addition to the VAC. There is still a little bit of heaped up senescent skin around the wound periphery. No significant change to the wound dimensions. 01/21/2022: No significant change to the wound dimensions. The senescent skin has not reaccumulated. The periwound skin remains a bit macerated but without any obvious breakdown. The wound surface itself has a shiny appearance with a little bit of slough accumulation; no true robust granulation tissue at this time. 01/28/2022: The wound is about the same size but a little bit shallower. There is a little bit of senescent skin reaccumulation at the cranial aspect. The periwound skin is red but not macerated and without any tissue breakdown. The wound still does not have the most robust surface. There is a bit of slough accumulation. 02/04/2022: The wound is a bit smaller and the undermining has come in somewhat. There continues to be granulation tissue formation within the wound bed. No significant slough accumulation and his periwound skin is in better condition. 02/18/2022: The wound stinks today. There is no obvious pus but the drainage and wound itself are malodorous. He continues to have heaped up tissue within the wound bed that is rather  grayish and not particularly robust-appearing. He is very angry today with his situation. 02/25/2022: Last week, in response to the odor coming from the wound, I took a culture and prescribed topical gentamicin as well as oral cefdinir. Apparently [facility] contacted him about a topical compounded antibiotic, but he did not realize this and he hung up on them. Today, the odor is no longer present. There is some senescent skin around the wound margins as well as continued heaped up granulation tissue in the center of the wound. The undermining continues to contract. 03/04/2022: The wound continues to contract and look better. He still has heaped up hypertrophic granulation tissue near the orifice. No odor was coming from the wound today. He is awaiting [place] delivery. 03/18/2022: 2-week follow-up. [facility] topical antibiotic has been initiated. The chemical cauterization of the hypertrophic granulation tissue was quite successful and the surface is much flatter today. He has heaped up senescent skin around the perimeter. His home health providers have figured out a way to keep the wound VAC from losing suction by bolstering with DuoDERM. Overall there has been significant improvement since our last visit. 04/01/2022: 2-week follow-up. The wound continues to contract. Once again, there is heaped up senescent skin around the perimeter. He has a little bit of skin breakdown in the distribution of the adhesive drape from the wound VAC. No odor or purulent drainage. No concern for infection. 04/15/2022: 2-week follow-up. He has developed a fairly substantial rash from the drape adhesive for his wound VAC. The periwound has a lot of heaped up epiboly at the margins. The tissue in the wound bed is a little bit pale but there is no odor or purulent drainage. 04/22/2022: Last week we discontinued the VAC. Today, the skin around his wound is in significantly improved condition. His rash is resolving. He does  have some heaped up senescent skin around the wound margin and a layer of slough  on the wound surface, but there is no concern for active infection. 04/29/2022: The wound continues to contract. The surface is clean. He continues to build up senescent skin around the wound edges that subsequently get a little bit macerated. No concern for infection. Electronic Signature(s) Signed: 04/29/2022 4:37:08 PM By: [name] MD FACS Signed: 04/29/2022 4:37:08 PM By: [name] MD FACS Entered By: [name] on 04/29/2022 16:37:08 -------------------------------------------------------------------------------- Physical Exam Details Patient Name: Date of Service: [name], [name] 04/29/2022 3:30 PM Medical Record Number: [id number] Patient Account Number: [id number] Date of Birth/Sex: Treating RN: 08-Apr-1966 (56 y.o. [name] Primary Care Provider: MO Delphina Cahill Other Clinician: Referring Provider: Treating Provider/Extender: [name] in Treatment: 15 Constitutional Hypertensive, asymptomatic. . . . No acute distress.Marland Kitchen Respiratory Normal work of breathing on room air.. Notes 04/29/2022: The wound continues to contract. The surface is clean. He continues to build up senescent skin around the wound edges that subsequently get a little bit macerated. No concern for infection. Electronic Signature(s) Signed: 04/29/2022 4:37:37 PM By: [name] MD FACS Entered By: [name] on 04/29/2022 16:37:37 -------------------------------------------------------------------------------- Physician Orders Details Patient Name: Date of Service: [name], [place] 04/29/2022 3:30 PM Medical Record Number: [id number] Patient Account Number: [id number] Date of Birth/Sex: Treating RN: [birth year] (56 y.o. [name] Primary Care Provider: MO Delphina Cahill Other Clinician: Referring Provider: Treating Provider/Extender: [name] in Treatment: 15 Verbal / Phone Orders: No Diagnosis Coding ICD-10 Coding Code Description L89.154 Pressure ulcer of sacral region, stage 4 Z89.512 Acquired absence of left leg below knee Z89.511 Acquired absence of right leg below knee Follow-up Appointments ppointment in 1 week. - Dr. Celine Ahr RM 2 Return A Wed. 8/30 @ 08:30 am ppointment in 2 weeks. - RM 1 with Vaughan Basta Return A Wed. 9/6 @ 1:15 pm Anesthetic Wound #1 Sacrum (In clinic) Topical Lidocaine 4% applied to wound bed Bathing/ Shower/ Hygiene May shower with protection but do not get wound dressing(s) wet. Off-Loading Gel mattress overlay (Group 1) Turn and reposition every 2 hours Home Health No change in wound care orders this week; continue Home Health for wound care. May utilize formulary equivalent dressing for wound treatment orders unless otherwise specified. Dressing changes to be completed by [facility] on Monday / Wednesday / Friday except when patient has scheduled visit at [facility]. Other Home Health Orders/Instructions: - [facility] Wound Treatment Wound #1 - Sacrum Peri-Wound Care: Triamcinolone 15 (g) 3 x Per Week/30 Days Discharge Instructions: Use triamcinolone 15 (g)to rash daily as needed Peri-Wound Care: Zinc Oxide Ointment 30g tube 3 x Per Week/30 Days Discharge Instructions: Apply Zinc Oxide to macerated periwound with each dressing change Topical: [facility] antibiotic compound 3 x Per Week/30 Days Discharge Instructions: in base of wound under gauze Prim Dressing: Medline Woven Gauze Sponges 4x4 (in/in) 3 x Per Week/30 Days ary Discharge Instructions: pack into wound, moisten with saline and [facility] compound Secondary Dressing: MPM Excel SAP Bordered Dressing, 7x6.7 (Sacral) (in/in) 3 x Per Week/30 Days Discharge Instructions: Apply silicone border over primary dressing as directed. Electronic Signature(s) Signed: 04/29/2022 4:55:45 PM By: [name] MD FACS Entered By: [name] on 04/29/2022 16:37:48 -------------------------------------------------------------------------------- Problem List Details Patient Name: Date of Service: [name], [place] 04/29/2022 3:30 PM Medical Record Number: [id number] Patient Account Number: [id number] Date of Birth/Sex: Treating RN: Jan 06, 1966 (56 y.o. [name] Primary Care Provider: MO RRISO [name] Other  Clinician: Referring Provider: Treating Provider/Extender: [name] in Treatment: 15 Active Problems ICD-10 Encounter Code Description Active Date MDM Diagnosis L89.154 Pressure ulcer of sacral region, stage 4 01/08/2022 No Yes Z89.512 Acquired absence of left leg below knee 01/08/2022 No Yes Z89.511 Acquired absence of right leg below knee 01/08/2022 No Yes Inactive Problems Resolved Problems Electronic Signature(s) Signed: 04/29/2022 4:33:17 PM By: [name] MD FACS Entered By: [name] on 04/29/2022 16:33:17 -------------------------------------------------------------------------------- Progress Note Details Patient Name: Date of Service: [name], [place] 04/29/2022 3:30 PM Medical Record Number: [id number] Patient Account Number: [id number] Date of Birth/Sex: Treating RN: 12/03/1965 (56 y.o. [name] Primary Care Provider: MO Delphina Cahill Other Clinician: Referring Provider: Treating Provider/Extender: [name] in Treatment: 15 Subjective Chief Complaint Information obtained from Patient Patient is at the clinic for treatment of an open pressure ulcer History of Present Illness (HPI) ADMISSION 01/08/2022 This is a 56 year old male with a past medical history notable for type 2 diabetes mellitus (last A1c was 8.6) congestive heart failure, hypertension, chronic pain, and bilateral below-knee amputations. His most recent amputation was in November 2022. While he was in  the hospital, he developed a sacral pressure ulcer. He was subsequently in a skilled nursing facility for some time. He was discharged with home health and had been in a wound VAC, but was then admitted to the hospital last week when the wound appeared to be worsening. Apparently the periwound skin was macerated and the device that he had been using was leaking quite a bit. Evaluation while in the hospital included a consultation with infectious disease who did not think he had any evidence of osteomyelitis, plastic surgery who felt that he was not an appropriate flap candidate (their note also states that he is not interested in a flap) and wound care who took him out of the wound VAC and initiated wet-to-dry dressing changes. He has a new wound VAC from [facility] on order, anticipated delivery today. The wound itself is fairly small and isolated to the sacrum. There is muscle exposed. No bone is appreciated but it is palpable beneath the surface. The muscle itself is bit pale and there is heaped up epibole around the wound edges. No significant odor or drainage. 01/14/2022: His wound VAC was not initiated until this past Friday. He has not had any issues with the VAC but today we found that the bridge foam was applied directly to the skin rather than over a layer of adhesive drape. His home health nurse also requested that we consider applying silver collagen to the wound bed in addition to the VAC. There is still a little bit of heaped up senescent skin around the wound periphery. No significant change to the wound dimensions. 01/21/2022: No significant change to the wound dimensions. The senescent skin has not reaccumulated. The periwound skin remains a bit macerated but without any obvious breakdown. The wound surface itself has a shiny appearance with a little bit of slough accumulation; no true robust granulation tissue at this time. 01/28/2022: The wound is about the same size but a little bit  shallower. There is a little bit of senescent skin reaccumulation at the cranial aspect. The periwound skin is red but not macerated and without any tissue breakdown. The wound still does not have the most robust surface. There is a bit of slough accumulation. 02/04/2022: The wound is a bit smaller and the undermining has come in somewhat. There continues to  be granulation tissue formation within the wound bed. No significant slough accumulation and his periwound skin is in better condition. 02/18/2022: The wound stinks today. There is no obvious pus but the drainage and wound itself are malodorous. He continues to have heaped up tissue within the wound bed that is rather grayish and not particularly robust-appearing. He is very angry today with his situation. 02/25/2022: Last week, in response to the odor coming from the wound, I took a culture and prescribed topical gentamicin as well as oral cefdinir. Apparently [facility] contacted him about a topical compounded antibiotic, but he did not realize this and he hung up on them. Today, the odor is no longer present. There is some senescent skin around the wound margins as well as continued heaped up granulation tissue in the center of the wound. The undermining continues to contract. 03/04/2022: The wound continues to contract and look better. He still has heaped up hypertrophic granulation tissue near the orifice. No odor was coming from the wound today. He is awaiting [place] delivery. 03/18/2022: 2-week follow-up. [facility] topical antibiotic has been initiated. The chemical cauterization of the hypertrophic granulation tissue was quite successful and the surface is much flatter today. He has heaped up senescent skin around the perimeter. His home health providers have figured out a way to keep the wound VAC from losing suction by bolstering with DuoDERM. Overall there has been significant improvement since our last visit. 04/01/2022: 2-week follow-up. The  wound continues to contract. Once again, there is heaped up senescent skin around the perimeter. He has a little bit of skin breakdown in the distribution of the adhesive drape from the wound VAC. No odor or purulent drainage. No concern for infection. 04/15/2022: 2-week follow-up. He has developed a fairly substantial rash from the drape adhesive for his wound VAC. The periwound has a lot of heaped up epiboly at the margins. The tissue in the wound bed is a little bit pale but there is no odor or purulent drainage. 04/22/2022: Last week we discontinued the VAC. Today, the skin around his wound is in significantly improved condition. His rash is resolving. He does have some heaped up senescent skin around the wound margin and a layer of slough on the wound surface, but there is no concern for active infection. 04/29/2022: The wound continues to contract. The surface is clean. He continues to build up senescent skin around the wound edges that subsequently get a little bit macerated. No concern for infection. Patient History Information obtained from Patient, Caregiver, Chart. Family History Cancer - Father, Heart Disease - Father,Paternal Grandparents, Hypertension - Father, No family history of Diabetes, Hereditary Spherocytosis, Kidney Disease, Lung Disease, Seizures, Stroke, Thyroid Problems, Tuberculosis. Social History Never smoker, Marital Status - Divorced, Alcohol Use - Never, Drug Use - No History, Caffeine Use - Moderate - coffee. Medical History Eyes Denies history of Cataracts, Glaucoma, Optic Neuritis Ear/Nose/Mouth/Throat Denies history of Chronic sinus problems/congestion, Middle ear problems Cardiovascular Patient has history of Congestive Heart Failure, Hypertension Endocrine Patient has history of Type II Diabetes Denies history of Type I Diabetes Genitourinary Denies history of End Stage Renal Disease Integumentary (Skin) Denies history of History of  Burn Musculoskeletal Patient has history of Osteomyelitis - S1 and coccyx Oncologic Denies history of Received Chemotherapy, Received Radiation Psychiatric Patient has history of Confinement Anxiety Denies history of Anorexia/bulimia Hospitalization/Surgery History - bil BKA. Medical A Surgical History Notes nd Respiratory pulmonary nodules Genitourinary renal mass, urinary hesitancy Musculoskeletal discitis of thoracic region Objective  Constitutional Hypertensive, asymptomatic. No acute distress.. Vitals Time Taken: 3:35 PM, Temperature: 97.9 F, Pulse: 98 bpm, Respiratory Rate: 18 breaths/min, Blood Pressure: 155/93 mmHg, Capillary Blood Glucose: 185 mg/dl. General Notes: glucose per pt report this am Respiratory Normal work of breathing on room air.. General Notes: 04/29/2022: The wound continues to contract. The surface is clean. He continues to build up senescent skin around the wound edges that subsequently get a little bit macerated. No concern for infection. Integumentary (Hair, Skin) Wound #1 status is Open. Original cause of wound was Pressure Injury. The date acquired was: 07/29/2021. The wound has been in treatment 15 weeks. The wound is located on the Sacrum. The wound measures 1.7cm length x 1.7cm width x 1.1cm depth; 2.27cm^2 area and 2.497cm^3 volume. There is Fat Layer (Subcutaneous Tissue) exposed. There is no tunneling noted, however, there is undermining starting at 12:00 and ending at 6:00 with a maximum distance of 1.6cm. There is a medium amount of serosanguineous drainage noted. The wound margin is epibole. There is large (67-100%) red, pink, pale granulation within the wound bed. There is a small (1-33%) amount of necrotic tissue within the wound bed. 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 There was a Selective/Open Wound Skin/Epidermis Debridement with a total area of 4 sq cm performed by [name], MD. With the following instrument(s): Curette to remove Non-Viable tissue/material. Material removed includes Callus and Skin: Epidermis and after achieving pain control using Lidocaine 4% T opical Solution. No specimens were taken. A time out was conducted at 15:50, prior to the start of the procedure. A Minimum amount of bleeding was controlled with Pressure. The procedure was tolerated well with a pain level of 0 throughout and a pain level of 0 following the procedure. Post Debridement Measurements: 1.7cm length x 1.7cm width x 1.1cm depth; 2.497cm^3 volume. Post debridement Stage noted as Category/Stage IV. Character of Wound/Ulcer Post Debridement is improved. Post procedure Diagnosis Wound #1: Same as Pre-Procedure Plan Follow-up Appointments: Return Appointment in 1 week. - Dr. Celine Ahr RM 2 Wed. 8/30 @ 08:30 am Return Appointment in 2 weeks. - RM 1 with Delice Lesch. 9/6 @ 1:15 pm Anesthetic: Wound #1 Sacrum: (In clinic) Topical Lidocaine 4% applied to wound bed Bathing/ Shower/ Hygiene: May shower with protection but do not get wound dressing(s) wet. Off-Loading: Gel mattress overlay (Group 1) Turn and reposition every 2 hours Home Health: No change in wound care orders this week; continue Home Health for wound care. May utilize formulary equivalent dressing for wound treatment orders unless otherwise specified. Dressing changes to be completed by [place] on Monday / Wednesday / Friday except when patient has scheduled visit at [facility]. Other Home Health Orders/Instructions: - [facility] WOUND #1: - Sacrum Wound Laterality: Peri-Wound Care: Triamcinolone 15 (g) 3 x Per Week/30 Days Discharge Instructions: Use triamcinolone 15 (g)to rash daily as needed Peri-Wound Care: Zinc Oxide Ointment 30g tube 3 x  Per Week/30 Days Discharge Instructions: Apply Zinc Oxide to macerated periwound with each dressing change Topical: [facility] antibiotic compound 3 x Per Week/30 Days Discharge Instructions: in base of wound under gauze Prim Dressing: Medline Woven Gauze Sponges 4x4 (in/in) 3 x Per Week/30 Days ary Discharge Instructions: pack into wound, moisten with saline and [facility] compound Secondary Dressing: MPM Excel SAP  Bordered Dressing, 7x6.7 (Sacral) (in/in) 3 x Per Week/30 Days Discharge Instructions: Apply silicone border over primary dressing as directed. 04/29/2022: The wound continues to contract. The surface is clean. He continues to build up senescent skin around the wound edges that subsequently get a little bit macerated. No concern for infection. I used a curette to debride the macerated senescent skin from the wound edge. We will continue to use gauze moistened with his [facility] topical compounded antibiotic and packed into the wound. We will apply zinc oxide to the periwound to try and minimize the maceration of the skin. Follow-up in 1 week. Electronic Signature(s) Signed: 04/29/2022 4:39:50 PM By: [name] MD FACS Entered By: [name] on 04/29/2022 16:39:50 -------------------------------------------------------------------------------- HxROS Details Patient Name: Date of Service: [name], [place] 04/29/2022 3:30 PM Medical Record Number: [id number] Patient Account Number: [id number] Date of Birth/Sex: Treating RN: 29-Nov-1965 (56 y.o. [name] Primary Care Provider: [place] Delphina Cahill Other Clinician: Referring Provider: Treating Provider/Extender: [name] in Treatment: 15 Information Obtained From Patient Caregiver Chart Eyes Medical History: Negative for: Cataracts; Glaucoma; Optic Neuritis Ear/Nose/Mouth/Throat Medical History: Negative for: Chronic sinus problems/congestion; Middle ear  problems Respiratory Medical History: Past Medical History Notes: pulmonary nodules Cardiovascular Medical History: Positive for: Congestive Heart Failure; Hypertension Endocrine Medical History: Positive for: Type II Diabetes Negative for: Type I Diabetes Time with diabetes: since 2004 Treated with: Insulin Blood sugar tested every day: Yes Tested : 4 times per day Genitourinary Medical History: Negative for: End Stage Renal Disease Past Medical History Notes: renal mass, urinary hesitancy Integumentary (Skin) Medical History: Negative for: History of Burn Musculoskeletal Medical History: Positive for: Osteomyelitis - S1 and coccyx Past Medical History Notes: discitis of thoracic region Oncologic Medical History: Negative for: Received Chemotherapy; Received Radiation Psychiatric Medical History: Positive for: Confinement Anxiety Negative for: Anorexia/bulimia Immunizations Pneumococcal Vaccine: Received Pneumococcal Vaccination: No Implantable Devices No devices added Hospitalization / Surgery History Type of Hospitalization/Surgery bil BKA Family and Social History Cancer: Yes - Father; Diabetes: No; Heart Disease: Yes - Father,Paternal Grandparents; Hereditary Spherocytosis: No; Hypertension: Yes - Father; Kidney Disease: No; Lung Disease: No; Seizures: No; Stroke: No; Thyroid Problems: No; Tuberculosis: No; Never smoker; Marital Status - Divorced; Alcohol Use: Never; Drug Use: No History; Caffeine Use: Moderate - coffee; Financial Concerns: No; Food, Clothing or Shelter Needs: No; Support System Lacking: No; Transportation Concerns: Yes - hurts to transfer Electronic Signature(s) Signed: 04/29/2022 4:55:45 PM By: [name] MD FACS Signed: 04/29/2022 6:18:08 PM By: Baruch Gouty RN, BSN Entered By: [name] on 04/29/2022 16:37:14 -------------------------------------------------------------------------------- [place] Details Patient  Name: Date of Service: [name], [name] 04/29/2022 Medical Record Number: [id number] Patient Account Number: [id number] Date of Birth/Sex: Treating RN: [birth year] (56 y.o. 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 Facility Procedures CPT4 Code: [id number] Description: 95638 - DEBRIDE WOUND 1ST 20 SQ CM OR < ICD-10 Diagnosis Description L89.154 Pressure ulcer of sacral region, stage 4 Modifier: Quantity: 1 Physician Procedures : CPT4 Code Description Modifier [id number] 99214 - WC PHYS LEVEL 4 - EST PT 25 ICD-10 Diagnosis Description L89.154 Pressure ulcer of sacral region, stage 4 Z89.512 Acquired absence of left leg below knee Z89.511 Acquired absence of  right leg below knee Quantity: 1 : [id number] 97597 - WC PHYS DEBR WO ANESTH 20 SQ CM ICD-10 Diagnosis Description L89.154 Pressure ulcer of sacral region, stage 4 Quantity: 1 Electronic Signature(s) Signed: 04/29/2022 4:40:08 PM By: [name] MD FACS Entered By: [name] on 04/29/2022 16:40:08

## 2022-04-30 ENCOUNTER — Telehealth: Payer: Self-pay | Admitting: Internal Medicine

## 2022-04-30 NOTE — Telephone Encounter (Signed)
..  Home Health Certification or Plan of Care Tracking  Is this a Certification or Plan of Care? yes  [place] Agency: [facility]  Order Number:  [id number]  Has charge sheet been attached? yes  Where has form been placed:  In provider's box  Faxed to:   [phone]

## 2022-04-30 NOTE — Telephone Encounter (Signed)
Faxed and recieved confirmation on 04/24/2022

## 2022-05-05 ENCOUNTER — Telehealth: Payer: Self-pay | Admitting: Internal Medicine

## 2022-05-05 ENCOUNTER — Other Ambulatory Visit: Payer: Self-pay | Admitting: Family

## 2022-05-05 NOTE — Telephone Encounter (Signed)
Caller states: - Patient has a bilateral inguinal hernias. Left sided hernia became swollen earlier today to the size of a tennis ball.  - Patient refused to go to the ED  - They were able to get swelling down to size of a golf ball   Caller requests: - Any recommendations from PCP in regards to inguinal hernia support

## 2022-05-05 NOTE — Telephone Encounter (Signed)
Spoke with patient and informed him of Dr.Morrison's recommendations. Will call back tomorrow after further discussing with Dr.Morrison.

## 2022-05-06 ENCOUNTER — Encounter (HOSPITAL_BASED_OUTPATIENT_CLINIC_OR_DEPARTMENT_OTHER): Payer: Medicare HMO | Admitting: General Surgery

## 2022-05-06 DIAGNOSIS — G8929 Other chronic pain: Secondary | ICD-10-CM | POA: Diagnosis not present

## 2022-05-06 DIAGNOSIS — L89154 Pressure ulcer of sacral region, stage 4: Secondary | ICD-10-CM | POA: Diagnosis not present

## 2022-05-06 DIAGNOSIS — E119 Type 2 diabetes mellitus without complications: Secondary | ICD-10-CM | POA: Diagnosis not present

## 2022-05-06 DIAGNOSIS — I509 Heart failure, unspecified: Secondary | ICD-10-CM | POA: Diagnosis not present

## 2022-05-06 DIAGNOSIS — Z89512 Acquired absence of left leg below knee: Secondary | ICD-10-CM | POA: Diagnosis not present

## 2022-05-06 DIAGNOSIS — Z89511 Acquired absence of right leg below knee: Secondary | ICD-10-CM | POA: Diagnosis not present

## 2022-05-06 DIAGNOSIS — I110 Hypertensive heart disease with heart failure: Secondary | ICD-10-CM | POA: Diagnosis not present

## 2022-05-06 NOTE — Progress Notes (Signed)
ISAY, PERLEBERG ([id number]) Visit Report for 05/06/2022 Arrival Information Details Patient Name: Date of Service: [place], [name] 05/06/2022 8:30 A M Medical Record Number: [id number] Patient Account Number: [id number] Date of Birth/Sex: Treating RN: 1965/09/11 (56 y.o. Waldron Session Primary Care Aashish Hamm: [place] Delphina Cahill Other Clinician: Referring Ples Trudel: Treating Avery Eustice/Extender: [name] [place] RRISO Suzette Battiest in Treatment: 16 Visit Information History Since Last Visit All ordered tests and consults were completed: Yes Patient Arrived: Wheel Chair Added or deleted any medications: No Arrival Time: 08:30 Any new allergies or adverse reactions: No Accompanied By: wife Had a fall or experienced change in No Transfer Assistance: None activities of daily living that may affect Patient Identification Verified: Yes risk of falls: Secondary Verification Process Completed: Yes Signs or symptoms of abuse/neglect since last visito No Patient Requires Transmission-Based Precautions: No Hospitalized since last visit: No Patient Has Alerts: No Implantable device outside of the clinic excluding No cellular tissue based products placed in the center since last visit: Has Dressing in Place as Prescribed: Yes Has Compression in Place as Prescribed: No Pain Present Now: Yes Electronic Signature(s) Signed: 05/06/2022 5:02:02 PM By: Blanche East RN Entered By: Blanche East on 05/06/2022 08:31:06 -------------------------------------------------------------------------------- Encounter Discharge Information Details Patient Name: Date of Service: [name], [name] 05/06/2022 8:30 A M Medical Record Number: [id number] Patient Account Number: [id number] Date of Birth/Sex: Treating RN: 1965-11-24 (56 y.o. Waldron Session Primary Care Srishti Strnad: [place] Delphina Cahill Other Clinician: Referring Forrest Jaroszewski: Treating Claramae Rigdon/Extender: [name] [place] RRISO [name] Weeks in  Treatment: 36 Encounter Discharge Information Items Post Procedure Vitals Discharge Condition: Stable Temperature (F): 97.8 Ambulatory Status: Wheelchair Pulse (bpm): 75 Discharge Destination: Home Respiratory Rate (breaths/min): 18 Transportation: Private Auto Blood Pressure (mmHg): 144/83 Accompanied By: wife Schedule Follow-up Appointment: Yes Clinical Summary of Care: Electronic Signature(s) Signed: 05/06/2022 5:02:02 PM By: Blanche East RN Entered By: Blanche East on 05/06/2022 08:50:10 -------------------------------------------------------------------------------- Lower Extremity Assessment Details Patient Name: Date of Service: [name], [name] 05/06/2022 8:30 A M Medical Record Number: [id number] Patient Account Number: [id number] Date of Birth/Sex: Treating RN: [birth year] (56 y.o. Waldron Session Primary Care Benney [place]: [place] Delphina Cahill Other Clinician: Referring Cheryel Kyte: Treating Giliana Vantil/Extender: [name] [place] RRISO [name] Weeks in Treatment: 16 Electronic Signature(s) Signed: 05/06/2022 5:02:02 PM By: Blanche East RN Entered By: Blanche East on 05/06/2022 08:32:27 -------------------------------------------------------------------------------- Multi Wound Chart Details Patient Name: Date of Service: [name], [name] 05/06/2022 8:30 A M Medical Record Number: [id number] Patient Account Number: [id number] Date of Birth/Sex: Treating RN: 1965-10-01 (56 y.o. M) Primary Care Catrell Morrone: [place] RRISO [name] Other Clinician: Referring Shanon Becvar: Treating Nastasia Kage/Extender: [name] [place] RRISO N, RYA N Weeks in Treatment: 16 Vital Signs Height(in): Pulse(bpm): 75 Weight(lbs): Blood Pressure(mmHg): 144/83 Body Mass Index(BMI): Temperature(F): 97.8 Respiratory Rate(breaths/min): 18 Photos: [N/A:N/A] Sacrum N/A N/A Wound Location: Pressure Injury N/A N/A Wounding Event: Pressure Ulcer N/A N/A Primary Etiology: Congestive Heart Failure, N/A  N/A Comorbid History: Hypertension, Type II Diabetes, Osteomyelitis, Confinement Anxiety 07/29/2021 N/A N/A Date Acquired: 16 N/A N/A Weeks of Treatment: Open N/A N/A Wound Status: No N/A N/A Wound Recurrence: 1.8x1.6x1.1 N/A N/A Measurements L x W x D (cm) 2.262 N/A N/A A (cm) : rea 2.488 N/A N/A Volume (cm) : 61.60% N/A N/A % Reduction in A rea: 61.60% N/A N/A % Reduction in Volume: 12 Starting Position 1 (o'clock): 5 Ending Position 1 (o'clock): 2.2 Maximum Distance 1 (cm): Yes  N/A N/A Undermining: Category/Stage IV N/A N/A Classification: Medium N/A N/A Exudate A mount: Serosanguineous N/A N/A Exudate Type: red, brown N/A N/A Exudate Color: Epibole N/A N/A Wound Margin: Large (67-100%) N/A N/A Granulation Amount: Red, Pink, Pale N/A N/A Granulation Quality: Small (1-33%) N/A N/A Necrotic Amount: Eschar, Adherent Slough N/A N/A Necrotic Tissue: Fat Layer (Subcutaneous Tissue): Yes N/A N/A Exposed Structures: Fascia: No Tendon: No Muscle: No Joint: No Bone: No Small (1-33%) N/A N/A Epithelialization: Debridement - Selective/Open Wound N/A N/A Debridement: Pre-procedure Verification/Time Out 08:46 N/A N/A Taken: Lidocaine 5% topical ointment N/A N/A Pain Control: Non-Viable Tissue N/A N/A Level: 2.88 N/A N/A Debridement A (sq cm): rea Curette N/A N/A Instrument: Minimum N/A N/A Bleeding: Pressure N/A N/A Hemostasis A chieved: 0 N/A N/A Procedural Pain: 0 N/A N/A Post Procedural Pain: Procedure was tolerated well N/A N/A Debridement Treatment Response: 1.8x1.6x1.1 N/A N/A Post Debridement Measurements L x W x D (cm) 2.488 N/A N/A Post Debridement Volume: (cm) Category/Stage IV N/A N/A Post Debridement Stage: Debridement N/A N/A Procedures Performed: Treatment Notes Wound #1 (Sacrum) Cleanser Peri-Wound Care Triamcinolone 15 (g) Discharge Instruction: Use triamcinolone 15 (g)to rash daily as needed Zinc Oxide Ointment  30g tube Discharge Instruction: Apply Zinc Oxide to macerated periwound with each dressing change Topical Keystone antibiotic compound Discharge Instruction: in base of wound under gauze Primary Dressing Medline Woven Gauze Sponges 4x4 (in/in) Discharge Instruction: pack into wound, moisten with saline and Keystone compound Secondary Dressing MPM Excel SAP Bordered Dressing, 7x6.7 (Sacral) (in/in) Discharge Instruction: Apply silicone border over primary dressing as directed. Secured With Compression Wrap Compression Stockings Environmental education officer) Signed: 05/06/2022 9:01:51 AM By: [name] MD FACS Entered By: [name] on 05/06/2022 09:01:51 -------------------------------------------------------------------------------- Multi-Disciplinary Care Plan Details Patient Name: Date of Service: [name], [name] 05/06/2022 8:30 A M Medical Record Number: [id number] Patient Account Number: [id number] Date of Birth/Sex: Treating RN: 1966/04/24 (56 y.o. Waldron Session Primary Care Jaiveon Suppes: [place] Delphina Cahill Other Clinician: Referring Asuzena Weis: Treating Florestine Carmical/Extender: [name] [place] RRISO Suzette Battiest in Treatment: [place] reviewed with physician Active Inactive Nutrition Nursing Diagnoses: Impaired glucose control: actual or potential Potential for alteratiion in Nutrition/Potential for imbalanced nutrition Goals: Patient/caregiver will maintain therapeutic glucose control Date Initiated: 01/08/2022 Target Resolution Date: 05/28/2022 Goal Status: Active Interventions: Assess HgA1c results as ordered upon admission and as needed Assess patient nutrition upon admission and as needed per policy Provide education on elevated blood sugars and impact on wound healing Treatment Activities: Dietary management education, guidance and counseling : 01/08/2022 Patient referred to Primary Care Physician for further nutritional  evaluation : 01/08/2022 Notes: Pressure Nursing Diagnoses: Knowledge deficit related to causes and risk factors for pressure ulcer development Knowledge deficit related to management of pressures ulcers Potential for impaired tissue integrity related to pressure, friction, moisture, and shear Goals: Patient/caregiver will verbalize understanding of pressure ulcer management Date Initiated: 01/08/2022 Target Resolution Date: 05/28/2022 Goal Status: Active Interventions: Assess: immobility, friction, shearing, incontinence upon admission and as needed Assess offloading mechanisms upon admission and as needed Assess potential for pressure ulcer upon admission and as needed Notes: Wound/Skin Impairment Nursing Diagnoses: Impaired tissue integrity Knowledge deficit related to ulceration/compromised skin integrity Goals: Patient/caregiver will verbalize understanding of skin care regimen Date Initiated: 01/08/2022 Target Resolution Date: 05/28/2022 Goal Status: Active Ulcer/skin breakdown will have a volume reduction of 30% by week 4 Date Initiated: 01/08/2022 Date Inactivated: 02/18/2022 Target Resolution Date: 02/05/2022 Goal Status: Unmet Unmet Reason: [facility]  leaking Ulcer/skin breakdown will have a volume reduction of 50% by week 8 Date Initiated: 02/18/2022 Date Inactivated: 03/04/2022 Target Resolution Date: 03/05/2022 Goal Status: Unmet Unmet Reason: infection Ulcer/skin breakdown will have a volume reduction of 80% by week 12 Date Initiated: 03/04/2022 Date Inactivated: 04/01/2022 Target Resolution Date: 04/02/2022 Goal Status: Unmet Unmet Reason: too much moisture Interventions: Assess patient/caregiver ability to obtain necessary supplies Assess patient/caregiver ability to perform ulcer/skin care regimen upon admission and as needed Assess ulceration(s) every visit Provide education on ulcer and skin care Treatment Activities: Skin care regimen initiated : 01/08/2022 Topical wound  management initiated : 01/08/2022 Notes: Electronic Signature(s) Signed: 05/06/2022 5:02:02 PM By: Blanche East RN Entered By: Blanche East on 05/06/2022 08:42:44 -------------------------------------------------------------------------------- Pain Assessment Details Patient Name: Date of Service: [name], [name] 05/06/2022 8:30 A M Medical Record Number: [id number] Patient Account Number: [id number] Date of Birth/Sex: Treating RN: 1966-09-01 (56 y.o. Waldron Session Primary Care Cristel Rail: [place] Delphina Cahill Other Clinician: Referring Gildo Crisco: Treating Leone Putman/Extender: [name] [place] RRISO [name] Weeks in Treatment: 16 Active Problems Location of Pain Severity and Description of Pain Patient Has Paino Yes Site Locations Pain Location: Generalized Pain Rate the pain. Current Pain Level: 8 Pain Management and Medication Current Pain Management: Electronic Signature(s) Signed: 05/06/2022 5:02:02 PM By: Blanche East RN Entered By: Blanche East on 05/06/2022 08:31:50 -------------------------------------------------------------------------------- Patient/Caregiver Education Details Patient Name: Date of Service: [name], [name] 8/30/2023andnbsp8:30 A M Medical Record Number: [id number] Patient Account Number: [id number] Date of Birth/Gender: Treating RN: 09/29/1965 (56 y.o. Waldron Session Primary Care Physician: [place] Delphina Cahill Other Clinician: Referring Physician: Treating Physician/Extender: [name] [place] RRISO Suzette Battiest in Treatment: 16 Education Assessment Education Provided To: Patient Education Topics Provided Elevated Blood Sugar/ Impact on Healing: Methods: Explain/Verbal Responses: Reinforcements needed, State content correctly Wound/Skin Impairment: Methods: Explain/Verbal Responses: Reinforcements needed, State content correctly Electronic Signature(s) Signed: 05/06/2022 5:02:02 PM By: Blanche East RN Entered By: Blanche East  on 05/06/2022 08:43:02 -------------------------------------------------------------------------------- Wound Assessment Details Patient Name: Date of Service: [name], [name] 05/06/2022 8:30 A M Medical Record Number: [id number] Patient Account Number: [id number] Date of Birth/Sex: Treating RN: [birth year] (56 y.o. Waldron Session Primary Care Darshay Deupree: [place] Delphina Cahill Other Clinician: Referring Thula Stewart: Treating Askia Hazelip/Extender: [name] [place] RRISO [name] Weeks in Treatment: 16 Wound Status Wound Number: 1 Primary Pressure Ulcer Etiology: Wound Location: Sacrum Wound Open Wounding Event: Pressure Injury Status: Date Acquired: 07/29/2021 Comorbid Congestive Heart Failure, Hypertension, Type II Diabetes, Weeks Of Treatment: 16 History: Osteomyelitis, Confinement Anxiety Clustered Wound: No Photos Wound Measurements Length: (cm) 1.8 Width: (cm) 1.6 Depth: (cm) 1.1 Area: (cm) 2.262 Volume: (cm) 2.488 % Reduction in Area: 61.6% % Reduction in Volume: 61.6% Epithelialization: Small (1-33%) Tunneling: No Undermining: Yes Starting Position (o'clock): 12 Ending Position (o'clock): 5 Maximum Distance: (cm) 2.2 Wound Description Classification: Category/Stage IV Wound Margin: Epibole Exudate Amount: Medium Exudate Type: Serosanguineous Exudate Color: red, brown Foul Odor After Cleansing: No Slough/Fibrino Yes Wound Bed Granulation Amount: Large (67-100%) Exposed Structure Granulation Quality: Red, Pink, Pale Fascia Exposed: No Necrotic Amount: Small (1-33%) Fat Layer (Subcutaneous Tissue) Exposed: Yes Necrotic Quality: Eschar, Adherent Slough Tendon Exposed: No Muscle Exposed: No Joint Exposed: No Bone Exposed: No Treatment Notes Wound #1 (Sacrum) Cleanser Peri-Wound Care Triamcinolone 15 (g) Discharge Instruction: Use triamcinolone 15 (g)to rash daily as needed Zinc Oxide Ointment 30g tube Discharge Instruction: Apply Zinc Oxide to macerated  periwound with  each dressing change Topical Keystone antibiotic compound Discharge Instruction: in base of wound under gauze Primary Dressing Medline Woven Gauze Sponges 4x4 (in/in) Discharge Instruction: pack into wound, moisten with saline and Keystone compound Secondary Dressing MPM Excel SAP Bordered Dressing, 7x6.7 (Sacral) (in/in) Discharge Instruction: Apply silicone border over primary dressing as directed. Secured With Compression Wrap Compression Stockings Environmental education officer) Signed: 05/06/2022 5:02:02 PM By: Blanche East RN Entered By: Blanche East on 05/06/2022 08:40:49 -------------------------------------------------------------------------------- Vitals Details Patient Name: Date of Service: [name], [name] 05/06/2022 8:30 A M Medical Record Number: [id number] Patient Account Number: [id number] Date of Birth/Sex: Treating RN: [birth year] (56 y.o. Waldron Session Primary Care Keath Matera: [place] Delphina Cahill Other Clinician: Referring Tawanda Schall: Treating Laure Leone/Extender: [name] [place] RRISO [name] Weeks in Treatment: 16 Vital Signs Time Taken: 08:31 Temperature (F): 97.8 Pulse (bpm): 75 Respiratory Rate (breaths/min): 18 Blood Pressure (mmHg): 144/83 Reference Range: 80 - 120 mg / dl Electronic Signature(s) Signed: 05/06/2022 5:02:02 PM By: Blanche East RN Entered By: Blanche East on 05/06/2022 08:31:41

## 2022-05-06 NOTE — Progress Notes (Signed)
[name], [name] ([id number]) Visit Report for 05/06/2022 Chief Complaint Document Details Patient Name: Date of Service: [place], [name] 05/06/2022 8:30 A M Medical Record Number: [id number] Patient Account Number: [id number] Date of Birth/Sex: Treating RN: March 11, 1966 (56 y.o. M) Primary Care Provider: MO RRISO [name] Other Clinician: Referring Provider: Treating Provider/Extender: [name] MO RRISO [name] Weeks in Treatment: 16 Information Obtained from: Patient Chief Complaint Patient is at the clinic for treatment of an open pressure ulcer Electronic Signature(s) Signed: 05/06/2022 9:01:57 AM By: [name] MD FACS Entered By: [name] on 05/06/2022 09:01:57 -------------------------------------------------------------------------------- Debridement Details Patient Name: Date of Service: [name], [name] 05/06/2022 8:30 A M Medical Record Number: [id number] Patient Account Number: [id number] Date of Birth/Sex: Treating RN: [birth year] (56 y.o. Waldron Session Primary Care Provider: MO Delphina Cahill Other Clinician: Referring Provider: Treating Provider/Extender: [name] MO RRISO Suzette Battiest in Treatment: 16 Debridement Performed for Assessment: Wound #1 Sacrum Performed By: Physician [name], MD Debridement Type: Debridement Level of Consciousness (Pre-procedure): Awake and Alert Pre-procedure Verification/Time Out Yes - 08:46 Taken: Start Time: 08:47 Pain Control: Lidocaine 5% topical ointment T Area Debrided (L x W): otal 1.8 (cm) x 1.6 (cm) = 2.88 (cm) Tissue and other material debrided: Non-Viable, Slough, Slough Level: Non-Viable Tissue Debridement Description: Selective/Open Wound Instrument: Curette Bleeding: Minimum Hemostasis Achieved: Pressure Procedural Pain: 0 Post Procedural Pain: 0 Response to Treatment: Procedure was tolerated well Level of Consciousness (Post- Awake and Alert procedure): Post Debridement  Measurements of Total Wound Length: (cm) 1.8 Stage: Category/Stage IV Width: (cm) 1.6 Depth: (cm) 1.1 Volume: (cm) 2.488 Character of Wound/Ulcer Post Debridement: Improved Post Procedure Diagnosis Same as Pre-procedure Electronic Signature(s) Signed: 05/06/2022 10:05:10 AM By: [name] MD FACS Signed: 05/06/2022 5:02:02 PM By: Blanche East RN Entered By: Blanche East on 05/06/2022 09:06:55 -------------------------------------------------------------------------------- HPI Details Patient Name: Date of Service: [name], [name] 05/06/2022 8:30 A M Medical Record Number: [id number] Patient Account Number: [id number] Date of Birth/Sex: Treating RN: 28-May-1966 (56 y.o. M) Primary Care Provider: MO Delphina Cahill Other Clinician: Referring Provider: Treating Provider/Extender: [name] MO RRISO [name] Weeks in Treatment: 16 History of Present Illness HPI Description: ADMISSION 01/08/2022 This is a 56 year old male with a past medical history notable for type 2 diabetes mellitus (last A1c was 8.6) congestive heart failure, hypertension, chronic pain, and bilateral below-knee amputations. His most recent amputation was in November 2022. While he was in the hospital, he developed a sacral pressure ulcer. He was subsequently in a skilled nursing facility for some time. He was discharged with home health and had been in a wound VAC, but was then admitted to the hospital last week when the wound appeared to be worsening. Apparently the periwound skin was macerated and the device that he had been using was leaking quite a bit. Evaluation while in the hospital included a consultation with infectious disease who did not think he had any evidence of osteomyelitis, plastic surgery who felt that he was not an appropriate flap candidate (their note also states that he is not interested in a flap) and wound care who took him out of the wound VAC and initiated wet-to-dry dressing  changes. He has a new wound VAC from [facility] on order, anticipated delivery today. The wound itself is fairly small and isolated to the sacrum. There is muscle exposed. No bone is appreciated but it is palpable beneath the surface. The muscle itself is  bit pale and there is heaped up epibole around the wound edges. No significant odor or drainage. 01/14/2022: His wound VAC was not initiated until this past Friday. He has not had any issues with the VAC but today we found that the bridge foam was applied directly to the skin rather than over a layer of adhesive drape. His home health nurse also requested that we consider applying silver collagen to the wound bed in addition to the VAC. There is still a little bit of heaped up senescent skin around the wound periphery. No significant change to the wound dimensions. 01/21/2022: No significant change to the wound dimensions. The senescent skin has not reaccumulated. The periwound skin remains a bit macerated but without any obvious breakdown. The wound surface itself has a shiny appearance with a little bit of slough accumulation; no true robust granulation tissue at this time. 01/28/2022: The wound is about the same size but a little bit shallower. There is a little bit of senescent skin reaccumulation at the cranial aspect. The periwound skin is red but not macerated and without any tissue breakdown. The wound still does not have the most robust surface. There is a bit of slough accumulation. 02/04/2022: The wound is a bit smaller and the undermining has come in somewhat. There continues to be granulation tissue formation within the wound bed. No significant slough accumulation and his periwound skin is in better condition. 02/18/2022: The wound stinks today. There is no obvious pus but the drainage and wound itself are malodorous. He continues to have heaped up tissue within the wound bed that is rather grayish and not particularly robust-appearing. He is very  angry today with his situation. 02/25/2022: Last week, in response to the odor coming from the wound, I took a culture and prescribed topical gentamicin as well as oral cefdinir. Apparently [facility] contacted him about a topical compounded antibiotic, but he did not realize this and he hung up on them. Today, the odor is no longer present. There is some senescent skin around the wound margins as well as continued heaped up granulation tissue in the center of the wound. The undermining continues to contract. 03/04/2022: The wound continues to contract and look better. He still has heaped up hypertrophic granulation tissue near the orifice. No odor was coming from the wound today. He is awaiting [place] delivery. 03/18/2022: 2-week follow-up. [facility] topical antibiotic has been initiated. The chemical cauterization of the hypertrophic granulation tissue was quite successful and the surface is much flatter today. He has heaped up senescent skin around the perimeter. His home health providers have figured out a way to keep the wound VAC from losing suction by bolstering with DuoDERM. Overall there has been significant improvement since our last visit. 04/01/2022: 2-week follow-up. The wound continues to contract. Once again, there is heaped up senescent skin around the perimeter. He has a little bit of skin breakdown in the distribution of the adhesive drape from the wound VAC. No odor or purulent drainage. No concern for infection. 04/15/2022: 2-week follow-up. He has developed a fairly substantial rash from the drape adhesive for his wound VAC. The periwound has a lot of heaped up epiboly at the margins. The tissue in the wound bed is a little bit pale but there is no odor or purulent drainage. 04/22/2022: Last week we discontinued the VAC. Today, the skin around his wound is in significantly improved condition. His rash is resolving. He does have some heaped up senescent skin around the wound  margin and a  layer of slough on the wound surface, but there is no concern for active infection. 04/29/2022: The wound continues to contract. The surface is clean. He continues to build up senescent skin around the wound edges that subsequently get a little bit macerated. No concern for infection. 05/06/2022: The wound is smaller again today in all dimensions. The surface is clean. He has his usual accumulation of macerated senescent skin around the wound edges. Electronic Signature(s) Signed: 05/06/2022 9:02:27 AM By: [name] MD FACS Entered By: [name] on 05/06/2022 09:02:27 -------------------------------------------------------------------------------- Physical Exam Details Patient Name: Date of Service: [name], [name] 05/06/2022 8:30 A M Medical Record Number: [id number] Patient Account Number: [id number] Date of Birth/Sex: Treating RN: June 13, 1966 (56 y.o. M) Primary Care Provider: MO RRISO [name] Other Clinician: Referring Provider: Treating Provider/Extender: [name] MO RRISO N, RYA N Weeks in Treatment: 16 Constitutional Slightly hypertensive. . . . No acute distress.Marland Kitchen Respiratory Normal work of breathing on room air.. Notes 05/06/2022: The wound is smaller again today in all dimensions. The surface is clean. He has his usual accumulation of macerated senescent skin around the wound edges. Electronic Signature(s) Signed: 05/06/2022 9:02:51 AM By: [name] MD FACS Entered By: [name] on 05/06/2022 09:02:51 -------------------------------------------------------------------------------- Physician Orders Details Patient Name: Date of Service: [name], [name] 05/06/2022 8:30 A M Medical Record Number: [id number] Patient Account Number: [id number] Date of Birth/Sex: Treating RN: 08-24-1966 (56 y.o. Waldron Session Primary Care Provider: MO Delphina Cahill Other Clinician: Referring Provider: Treating Provider/Extender: [name] MO  RRISO Suzette Battiest in Treatment: 28 Verbal / Phone Orders: No Diagnosis Coding ICD-10 Coding Code Description L89.154 Pressure ulcer of sacral region, stage 4 Z89.512 Acquired absence of left leg below knee Z89.511 Acquired absence of right leg below knee Follow-up Appointments ppointment in 1 week. - RM 1 with Vaughan Basta Return A Wed. 9/6 @ 1:15 pm Anesthetic Wound #1 Sacrum (In clinic) Topical Lidocaine 4% applied to wound bed Bathing/ Shower/ Hygiene May shower with protection but do not get wound dressing(s) wet. Off-Loading Gel mattress overlay (Group 1) Turn and reposition every 2 hours Home Health No change in wound care orders this week; continue Home Health for wound care. May utilize formulary equivalent dressing for wound treatment orders unless otherwise specified. Dressing changes to be completed by [place] on Monday / Wednesday / Friday except when patient has scheduled visit at [facility]. Other Home Health Orders/Instructions: - [facility] Wound Treatment Wound #1 - Sacrum Peri-Wound Care: Triamcinolone 15 (g) 3 x Per Week/30 Days Discharge Instructions: Use triamcinolone 15 (g)to rash daily as needed Peri-Wound Care: Zinc Oxide Ointment 30g tube 3 x Per Week/30 Days Discharge Instructions: Apply Zinc Oxide to macerated periwound with each dressing change Topical: [facility] antibiotic compound 3 x Per Week/30 Days Discharge Instructions: in base of wound under gauze Prim Dressing: Medline Woven Gauze Sponges 4x4 (in/in) 3 x Per Week/30 Days ary Discharge Instructions: pack into wound, moisten with saline and [facility] compound Secondary Dressing: MPM Excel SAP Bordered Dressing, 7x6.7 (Sacral) (in/in) 3 x Per Week/30 Days Discharge Instructions: Apply silicone border over primary dressing as directed. Electronic Signature(s) Signed: 05/06/2022 10:05:10 AM By: [name] MD FACS Entered By: [name] on 05/06/2022  09:03:07 -------------------------------------------------------------------------------- Problem List Details Patient Name: Date of Service: [name], [name] 05/06/2022 8:30 A M Medical Record Number: [id number] Patient Account Number: [id number] Date of Birth/Sex: Treating RN: Aug 09, 1966 (56 y.o. M)  Blanche East Primary Care Provider: MO Delphina Cahill Other Clinician: Referring Provider: Treating Provider/Extender: [name] MO RRISO [name] Weeks in Treatment: 16 Active Problems ICD-10 Encounter Code Description Active Date MDM Diagnosis L89.154 Pressure ulcer of sacral region, stage 4 01/08/2022 No Yes Z89.512 Acquired absence of left leg below knee 01/08/2022 No Yes Z89.511 Acquired absence of right leg below knee 01/08/2022 No Yes Inactive Problems Resolved Problems Electronic Signature(s) Signed: 05/06/2022 9:01:46 AM By: [name] MD FACS Entered By: [name] on 05/06/2022 09:01:46 -------------------------------------------------------------------------------- Progress Note Details Patient Name: Date of Service: [name], [name] 05/06/2022 8:30 A M Medical Record Number: [id number] Patient Account Number: [id number] Date of Birth/Sex: Treating RN: Aug 09, 1966 (56 y.o. M) Primary Care Provider: MO RRISO [name] Other Clinician: Referring Provider: Treating Provider/Extender: [name] MO RRISO [name] Weeks in Treatment: 16 Subjective Chief Complaint Information obtained from Patient Patient is at the clinic for treatment of an open pressure ulcer History of Present Illness (HPI) ADMISSION 01/08/2022 This is a 56 year old male with a past medical history notable for type 2 diabetes mellitus (last A1c was 8.6) congestive heart failure, hypertension, chronic pain, and bilateral below-knee amputations. His most recent amputation was in November 2022. While he was in the hospital, he developed a sacral pressure ulcer. He was subsequently in a  skilled nursing facility for some time. He was discharged with home health and had been in a wound VAC, but was then admitted to the hospital last week when the wound appeared to be worsening. Apparently the periwound skin was macerated and the device that he had been using was leaking quite a bit. Evaluation while in the hospital included a consultation with infectious disease who did not think he had any evidence of osteomyelitis, plastic surgery who felt that he was not an appropriate flap candidate (their note also states that he is not interested in a flap) and wound care who took him out of the wound VAC and initiated wet-to-dry dressing changes. He has a new wound VAC from [facility] on order, anticipated delivery today. The wound itself is fairly small and isolated to the sacrum. There is muscle exposed. No bone is appreciated but it is palpable beneath the surface. The muscle itself is bit pale and there is heaped up epibole around the wound edges. No significant odor or drainage. 01/14/2022: His wound VAC was not initiated until this past Friday. He has not had any issues with the VAC but today we found that the bridge foam was applied directly to the skin rather than over a layer of adhesive drape. His home health nurse also requested that we consider applying silver collagen to the wound bed in addition to the VAC. There is still a little bit of heaped up senescent skin around the wound periphery. No significant change to the wound dimensions. 01/21/2022: No significant change to the wound dimensions. The senescent skin has not reaccumulated. The periwound skin remains a bit macerated but without any obvious breakdown. The wound surface itself has a shiny appearance with a little bit of slough accumulation; no true robust granulation tissue at this time. 01/28/2022: The wound is about the same size but a little bit shallower. There is a little bit of senescent skin reaccumulation at the cranial  aspect. The periwound skin is red but not macerated and without any tissue breakdown. The wound still does not have the most robust surface. There is a bit of slough accumulation. 02/04/2022:  The wound is a bit smaller and the undermining has come in somewhat. There continues to be granulation tissue formation within the wound bed. No significant slough accumulation and his periwound skin is in better condition. 02/18/2022: The wound stinks today. There is no obvious pus but the drainage and wound itself are malodorous. He continues to have heaped up tissue within the wound bed that is rather grayish and not particularly robust-appearing. He is very angry today with his situation. 02/25/2022: Last week, in response to the odor coming from the wound, I took a culture and prescribed topical gentamicin as well as oral cefdinir. Apparently [facility] contacted him about a topical compounded antibiotic, but he did not realize this and he hung up on them. Today, the odor is no longer present. There is some senescent skin around the wound margins as well as continued heaped up granulation tissue in the center of the wound. The undermining continues to contract. 03/04/2022: The wound continues to contract and look better. He still has heaped up hypertrophic granulation tissue near the orifice. No odor was coming from the wound today. He is awaiting [place] delivery. 03/18/2022: 2-week follow-up. [facility] topical antibiotic has been initiated. The chemical cauterization of the hypertrophic granulation tissue was quite successful and the surface is much flatter today. He has heaped up senescent skin around the perimeter. His home health providers have figured out a way to keep the wound VAC from losing suction by bolstering with DuoDERM. Overall there has been significant improvement since our last visit. 04/01/2022: 2-week follow-up. The wound continues to contract. Once again, there is heaped up senescent skin  around the perimeter. He has a little bit of skin breakdown in the distribution of the adhesive drape from the wound VAC. No odor or purulent drainage. No concern for infection. 04/15/2022: 2-week follow-up. He has developed a fairly substantial rash from the drape adhesive for his wound VAC. The periwound has a lot of heaped up epiboly at the margins. The tissue in the wound bed is a little bit pale but there is no odor or purulent drainage. 04/22/2022: Last week we discontinued the VAC. Today, the skin around his wound is in significantly improved condition. His rash is resolving. He does have some heaped up senescent skin around the wound margin and a layer of slough on the wound surface, but there is no concern for active infection. 04/29/2022: The wound continues to contract. The surface is clean. He continues to build up senescent skin around the wound edges that subsequently get a little bit macerated. No concern for infection. 05/06/2022: The wound is smaller again today in all dimensions. The surface is clean. He has his usual accumulation of macerated senescent skin around the wound edges. Patient History Information obtained from Patient, Caregiver, Chart. Family History Cancer - Father, Heart Disease - Father,Paternal Grandparents, Hypertension - Father, No family history of Diabetes, Hereditary Spherocytosis, Kidney Disease, Lung Disease, Seizures, Stroke, Thyroid Problems, Tuberculosis. Social History Never smoker, Marital Status - Divorced, Alcohol Use - Never, Drug Use - No History, Caffeine Use - Moderate - coffee. Medical History Eyes Denies history of Cataracts, Glaucoma, Optic Neuritis Ear/Nose/Mouth/Throat Denies history of Chronic sinus problems/congestion, Middle ear problems Cardiovascular Patient has history of Congestive Heart Failure, Hypertension Endocrine Patient has history of Type II Diabetes Denies history of Type I Diabetes Genitourinary Denies history of End  Stage Renal Disease Integumentary (Skin) Denies history of History of Burn Musculoskeletal Patient has history of Osteomyelitis - S1 and coccyx Oncologic  Denies history of Received Chemotherapy, Received Radiation Psychiatric Patient has history of Confinement Anxiety Denies history of Anorexia/bulimia Hospitalization/Surgery History - bil BKA. Medical A Surgical History Notes nd Respiratory pulmonary nodules Genitourinary renal mass, urinary hesitancy Musculoskeletal discitis of thoracic region Objective Constitutional Slightly hypertensive. No acute distress.. Vitals Time Taken: 8:31 AM, Temperature: 97.8 F, Pulse: 75 bpm, Respiratory Rate: 18 breaths/min, Blood Pressure: 144/83 mmHg. Respiratory Normal work of breathing on room air.. General Notes: 05/06/2022: The wound is smaller again today in all dimensions. The surface is clean. He has his usual accumulation of macerated senescent skin around the wound edges. Integumentary (Hair, Skin) Wound #1 status is Open. Original cause of wound was Pressure Injury. The date acquired was: 07/29/2021. The wound has been in treatment 16 weeks. The wound is located on the Sacrum. The wound measures 1.8cm length x 1.6cm width x 1.1cm depth; 2.262cm^2 area and 2.488cm^3 volume. There is Fat Layer (Subcutaneous Tissue) exposed. There is no tunneling noted, however, there is undermining starting at 12:00 and ending at 5:00 with a maximum distance of 2.2cm. There is a medium amount of serosanguineous drainage noted. The wound margin is epibole. There is large (67-100%) red, pink, pale granulation within the wound bed. There is a small (1-33%) amount of necrotic tissue within the wound bed including Eschar and Adherent Slough. Assessment Active Problems ICD-10 Pressure ulcer of sacral region, stage 4 Acquired absence of left leg below knee Acquired absence of right leg below knee Procedures Wound #1 Pre-procedure diagnosis of Wound #1 is  a Pressure Ulcer located on the Sacrum . There was a Selective/Open Wound Non-Viable Tissue Debridement with a total area of 2.88 sq cm performed by [name], MD. With the following instrument(s): Curette to remove Non-Viable tissue/material. after achieving pain control using Lidocaine 5% topical ointment. No specimens were taken. A time out was conducted at 08:46, prior to the start of the procedure. A Minimum amount of bleeding was controlled with Pressure. The procedure was tolerated well with a pain level of 0 throughout and a pain level of 0 following the procedure. Post Debridement Measurements: 1.8cm length x 1.6cm width x 1.1cm depth; 2.488cm^3 volume. Post debridement Stage noted as Category/Stage IV. Character of Wound/Ulcer Post Debridement is improved. Post procedure Diagnosis Wound #1: Same as Pre-Procedure Plan Follow-up Appointments: Return Appointment in 1 week. - RM 1 with Delice Lesch. 9/6 @ 1:15 pm Anesthetic: Wound #1 Sacrum: (In clinic) Topical Lidocaine 4% applied to wound bed Bathing/ Shower/ Hygiene: May shower with protection but do not get wound dressing(s) wet. Off-Loading: Gel mattress overlay (Group 1) Turn and reposition every 2 hours Home Health: No change in wound care orders this week; continue Home Health for wound care. May utilize formulary equivalent dressing for wound treatment orders unless otherwise specified. Dressing changes to be completed by [place] on Monday / Wednesday / Friday except when patient has scheduled visit at [facility]. Other Home Health Orders/Instructions: - [facility] WOUND #1: - Sacrum Wound Laterality: Peri-Wound Care: Triamcinolone 15 (g) 3 x Per Week/30 Days Discharge Instructions: Use triamcinolone 15 (g)to rash daily as needed Peri-Wound Care: Zinc Oxide Ointment 30g tube 3 x Per Week/30 Days Discharge Instructions: Apply Zinc Oxide to macerated periwound with each dressing change Topical: [facility]  antibiotic compound 3 x Per Week/30 Days Discharge Instructions: in base of wound under gauze Prim Dressing: Medline Woven Gauze Sponges 4x4 (in/in) 3 x Per Week/30 Days ary Discharge Instructions: pack into wound, moisten with saline and  [facility] compound Secondary Dressing: MPM Excel SAP Bordered Dressing, 7x6.7 (Sacral) (in/in) 3 x Per Week/30 Days Discharge Instructions: Apply silicone border over primary dressing as directed. 05/06/2022: The wound is smaller again today in all dimensions. The surface is clean. He has his usual accumulation of macerated senescent skin around the wound edges. I used a curette to debride the macerated senescent skin from around the wound. We will continue to use his Keystone topical compounded antibiotic to moisten gauze and then pack the wound with this gauze. Follow-up in 1 week. Electronic Signature(s) Signed: 05/06/2022 9:03:39 AM By: [name] MD FACS Entered By: [name] on 05/06/2022 09:03:39 -------------------------------------------------------------------------------- HxROS Details Patient Name: Date of Service: [name], [name] 05/06/2022 8:30 A M Medical Record Number: [id number] Patient Account Number: [id number] Date of Birth/Sex: Treating RN: March 10, 1966 (57 y.o. M) Primary Care Provider: [place] RRISO [name] Other Clinician: Referring Provider: Treating Provider/Extender: [name] [place] RRISO [name] Weeks in Treatment: 16 Information Obtained From Patient Caregiver Chart Eyes Medical History: Negative for: Cataracts; Glaucoma; Optic Neuritis Ear/Nose/Mouth/Throat Medical History: Negative for: Chronic sinus problems/congestion; Middle ear problems Respiratory Medical History: Past Medical History Notes: pulmonary nodules Cardiovascular Medical History: Positive for: Congestive Heart Failure; Hypertension Endocrine Medical History: Positive for: Type II Diabetes Negative for: Type I Diabetes Time with  diabetes: since 2004 Treated with: Insulin Blood sugar tested every day: Yes Tested : 4 times per day Genitourinary Medical History: Negative for: End Stage Renal Disease Past Medical History Notes: renal mass, urinary hesitancy Integumentary (Skin) Medical History: Negative for: History of Burn Musculoskeletal Medical History: Positive for: Osteomyelitis - S1 and coccyx Past Medical History Notes: discitis of thoracic region Oncologic Medical History: Negative for: Received Chemotherapy; Received Radiation Psychiatric Medical History: Positive for: Confinement Anxiety Negative for: Anorexia/bulimia Immunizations Pneumococcal Vaccine: Received Pneumococcal Vaccination: No Implantable Devices No devices added Hospitalization / Surgery History Type of Hospitalization/Surgery bil BKA Family and Social History Cancer: Yes - Father; Diabetes: No; Heart Disease: Yes - Father,Paternal Grandparents; Hereditary Spherocytosis: No; Hypertension: Yes - Father; Kidney Disease: No; Lung Disease: No; Seizures: No; Stroke: No; Thyroid Problems: No; Tuberculosis: No; Never smoker; Marital Status - Divorced; Alcohol Use: Never; Drug Use: No History; Caffeine Use: Moderate - coffee; Financial Concerns: No; Food, Clothing or Shelter Needs: No; Support System Lacking: No; Transportation Concerns: Yes - hurts to transfer Electronic Signature(s) Signed: 05/06/2022 10:05:10 AM By: [name] MD FACS Entered By: [name] on 05/06/2022 09:02:31 -------------------------------------------------------------------------------- SuperBill Details Patient Name: Date of Service: [name], [place] 05/06/2022 Medical Record Number: [id number] Patient Account Number: [id number] Date of Birth/Sex: Treating RN: 11/27/1965 (56 y.o. 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 Facility Procedures CPT4 Code: [id number] Description: 7597 - DEBRIDE WOUND 1ST 20 SQ CM OR < ICD-10 Diagnosis Description L89.154 Pressure ulcer of sacral region, stage 4 Modifier: Quantity: 1 Physician Procedures : CPT4 Code Description Modifier [id number] - WC PHYS LEVEL 4 - EST PT 25 ICD-10 Diagnosis Description L89.154 Pressure ulcer of sacral region, stage 4 Z89.512 Acquired absence of left leg below knee Quantity: 1 : [id number] - WC PHYS DEBR WO ANESTH 20 SQ CM ICD-10 Diagnosis Description L89.154 Pressure ulcer of  sacral region, stage 4 Quantity: 1 Electronic Signature(s) Signed: 05/06/2022 9:03:55 AM By: [name] MD FACS Entered By: [name] on 05/06/2022 09:03:54

## 2022-05-08 ENCOUNTER — Other Ambulatory Visit: Payer: Self-pay

## 2022-05-08 DIAGNOSIS — K4000 Bilateral inguinal hernia, with obstruction, without gangrene, not specified as recurrent: Secondary | ICD-10-CM

## 2022-05-08 NOTE — Telephone Encounter (Signed)
Duplicate Fax?  Marland KitchenMarland KitchenHome Health Certification or Plan of Care Tracking   Is this a Certification or Plan of Care?  both   [facility] Agency:  [facility]   Order Number:   [id number]   Has charge sheet been attached?  yes   Where has form been placed:   In provider's box   Faxed to:

## 2022-05-08 NOTE — Telephone Encounter (Signed)
Placed referral for surgeon per patient request.

## 2022-05-12 NOTE — Telephone Encounter (Signed)
Yes this is a duplicate fax.

## 2022-05-13 ENCOUNTER — Encounter (HOSPITAL_BASED_OUTPATIENT_CLINIC_OR_DEPARTMENT_OTHER): Payer: Medicare HMO | Attending: General Surgery | Admitting: General Surgery

## 2022-05-13 DIAGNOSIS — I110 Hypertensive heart disease with heart failure: Secondary | ICD-10-CM | POA: Diagnosis not present

## 2022-05-13 DIAGNOSIS — E11622 Type 2 diabetes mellitus with other skin ulcer: Secondary | ICD-10-CM | POA: Insufficient documentation

## 2022-05-13 DIAGNOSIS — Z89512 Acquired absence of left leg below knee: Secondary | ICD-10-CM | POA: Insufficient documentation

## 2022-05-13 DIAGNOSIS — I509 Heart failure, unspecified: Secondary | ICD-10-CM | POA: Diagnosis not present

## 2022-05-13 DIAGNOSIS — G8929 Other chronic pain: Secondary | ICD-10-CM | POA: Insufficient documentation

## 2022-05-13 DIAGNOSIS — L89154 Pressure ulcer of sacral region, stage 4: Secondary | ICD-10-CM | POA: Diagnosis not present

## 2022-05-13 DIAGNOSIS — Z89511 Acquired absence of right leg below knee: Secondary | ICD-10-CM | POA: Insufficient documentation

## 2022-05-13 NOTE — Progress Notes (Signed)
ASHLY, GOETHE ([id number]) Visit Report for 05/13/2022 Arrival Information Details Patient Name: Date of Service: [place], [name] 05/13/2022 1:15 PM Medical Record Number: [id number] Patient Account Number: [id number] Date of Birth/Sex: Treating RN: 1965/09/22 (56 y.o. [name] Primary Care Danil Wedge: [place] Delphina Cahill Other Clinician: Referring Tala Eber: Treating Ashlynn Gunnels/Extender: [name] [place] RRISO Suzette Battiest in Treatment: 41 Visit Information History Since Last Visit Added or deleted any medications: No Patient Arrived: Wheel Chair Any new allergies or adverse reactions: No Arrival Time: 13:11 Had a fall or experienced change in No Accompanied By: friend activities of daily living that may affect Transfer Assistance: None risk of falls: Patient Identification Verified: Yes Signs or symptoms of abuse/neglect since last visito No Secondary Verification Process Completed: Yes Hospitalized since last visit: No Patient Requires Transmission-Based Precautions: No Implantable device outside of the clinic excluding No Patient Has Alerts: No cellular tissue based products placed in the center since last visit: Has Dressing in Place as Prescribed: Yes Pain Present Now: Yes Electronic Signature(s) Signed: 05/13/2022 5:54:13 PM By: Baruch Gouty RN, BSN Entered By: Baruch Gouty on 05/13/2022 13:22:35 -------------------------------------------------------------------------------- Encounter Discharge Information Details Patient Name: Date of Service: [name], [name] 05/13/2022 1:15 PM Medical Record Number: [id number] Patient Account Number: [id number] Date of Birth/Sex: Treating RN: 04-19-1966 (56 y.o. [name] Primary Care Alantra Popoca: [place] Delphina Cahill Other Clinician: Referring Aydian Dimmick: Treating Nydia Ytuarte/Extender: [name] [place] RRISO [name] Weeks in Treatment: 17 Encounter Discharge Information Items Post Procedure Vitals Discharge  Condition: Stable Temperature (F): 97.6 Ambulatory Status: Wheelchair Pulse (bpm): 101 Discharge Destination: Home Respiratory Rate (breaths/min): 18 Transportation: Private Auto Blood Pressure (mmHg): 151/80 Accompanied By: friend Schedule Follow-up Appointment: Yes Clinical Summary of Care: Patient Declined Electronic Signature(s) Signed: 05/13/2022 5:54:13 PM By: Baruch Gouty RN, BSN Entered By: Baruch Gouty on 05/13/2022 14:00:49 -------------------------------------------------------------------------------- Lower Extremity Assessment Details Patient Name: Date of Service: [facility], [name] 05/13/2022 1:15 PM Medical Record Number: [id number] Patient Account Number: [id number] Date of Birth/Sex: Treating RN: Jan 25, 1966 (56 y.o. [name] Primary Care Jaiquan Temme: [place] Delphina Cahill Other Clinician: Referring Sequan Auxier: Treating Isidro Monks/Extender: [name] [place] RRISO [name] Weeks in Treatment: 17 Electronic Signature(s) Signed: 05/13/2022 5:54:13 PM By: Baruch Gouty RN, BSN Entered By: Baruch Gouty on 05/13/2022 13:24:24 -------------------------------------------------------------------------------- Multi Wound Chart Details Patient Name: Date of Service: [name], [name] 05/13/2022 1:15 PM Medical Record Number: [id number] Patient Account Number: [id number] Date of Birth/Sex: Treating RN: 1966-06-04 (56 y.o. M) Primary Care Raveena Hebdon: [place] RRISO [name] Other Clinician: Referring Zeba Luby: Treating Rayden Dock/Extender: [name] [place] RRISO [name] Weeks in Treatment: 17 Vital Signs Height(in): Capillary Blood Glucose(mg/dl): 165 Weight(lbs): Pulse(bpm): 101 Body Mass Index(BMI): Blood Pressure(mmHg): 151/80 Temperature(F): 97.6 Respiratory Rate(breaths/min): 18 Photos: [N/A:N/A] Sacrum N/A N/A Wound Location: Pressure Injury N/A N/A Wounding Event: Pressure Ulcer N/A N/A Primary Etiology: Congestive Heart Failure, N/A N/A Comorbid  History: Hypertension, Type II Diabetes, Osteomyelitis, Confinement Anxiety 07/29/2021 N/A N/A Date Acquired: 17 N/A N/A Weeks of Treatment: Open N/A N/A Wound Status: No N/A N/A Wound Recurrence: 1.4x1.6x1.5 N/A N/A Measurements L x W x D (cm) 1.759 N/A N/A A (cm) : rea 2.639 N/A N/A Volume (cm) : 70.10% N/A N/A % Reduction in A rea: 59.30% N/A N/A % Reduction in Volume: 12 Starting Position 1 (o'clock): 6 Ending Position 1 (o'clock): 1 Maximum Distance 1 (cm): Yes N/A N/A Undermining: Category/Stage IV N/A N/A Classification: Medium N/A  N/A Exudate A mount: Serosanguineous N/A N/A Exudate Type: red, brown N/A N/A Exudate Color: Epibole N/A N/A Wound Margin: Large (67-100%) N/A N/A Granulation Amount: Red, Pink, Pale N/A N/A Granulation Quality: Small (1-33%) N/A N/A Necrotic Amount: Fat Layer (Subcutaneous Tissue): Yes N/A N/A Exposed Structures: Fascia: No Tendon: No Muscle: No Joint: No Bone: No Small (1-33%) N/A N/A Epithelialization: Debridement - Selective/Open Wound N/A N/A Debridement: Pre-procedure Verification/Time Out 13:35 N/A N/A Taken: Lidocaine 4% Topical Solution N/A N/A Pain Control: Skin/Epidermis N/A N/A Level: 4 N/A N/A Debridement A (sq cm): rea Curette N/A N/A Instrument: Minimum N/A N/A Bleeding: Pressure N/A N/A Hemostasis A chieved: 0 N/A N/A Procedural Pain: 0 N/A N/A Post Procedural Pain: Procedure was tolerated well N/A N/A Debridement Treatment Response: 1.4x1.6x1.5 N/A N/A Post Debridement Measurements L x W x D (cm) 2.639 N/A N/A Post Debridement Volume: (cm) Category/Stage IV N/A N/A Post Debridement Stage: Debridement N/A N/A Procedures Performed: Treatment Notes Electronic Signature(s) Signed: 05/13/2022 1:45:58 PM By: [name] MD FACS Entered By: [name] on 05/13/2022  13:45:58 -------------------------------------------------------------------------------- Multi-Disciplinary Care Plan Details Patient Name: Date of Service: [name], [name] 05/13/2022 1:15 PM Medical Record Number: [id number] Patient Account Number: [id number] Date of Birth/Sex: Treating RN: 1965-12-28 (56 y.o. [name] Primary Care Crescentia Boutwell: [place] Delphina Cahill Other Clinician: Referring Akshita Italiano: Treating Anthea Udovich/Extender: [name] [place] RRISO Suzette Battiest in Treatment: [place] reviewed with physician Active Inactive Nutrition Nursing Diagnoses: Impaired glucose control: actual or potential Potential for alteratiion in Nutrition/Potential for imbalanced nutrition Goals: Patient/caregiver will maintain therapeutic glucose control Date Initiated: 01/08/2022 Target Resolution Date: 05/28/2022 Goal Status: Active Interventions: Assess HgA1c results as ordered upon admission and as needed Assess patient nutrition upon admission and as needed per policy Provide education on elevated blood sugars and impact on wound healing Treatment Activities: Dietary management education, guidance and counseling : 01/08/2022 Patient referred to Primary Care Physician for further nutritional evaluation : 01/08/2022 Notes: Pressure Nursing Diagnoses: Knowledge deficit related to causes and risk factors for pressure ulcer development Knowledge deficit related to management of pressures ulcers Potential for impaired tissue integrity related to pressure, friction, moisture, and shear Goals: Patient/caregiver will verbalize understanding of pressure ulcer management Date Initiated: 01/08/2022 Target Resolution Date: 05/28/2022 Goal Status: Active Interventions: Assess: immobility, friction, shearing, incontinence upon admission and as needed Assess offloading mechanisms upon admission and as needed Assess potential for pressure ulcer upon admission and as  needed Notes: Wound/Skin Impairment Nursing Diagnoses: Impaired tissue integrity Knowledge deficit related to ulceration/compromised skin integrity Goals: Patient/caregiver will verbalize understanding of skin care regimen Date Initiated: 01/08/2022 Target Resolution Date: 05/28/2022 Goal Status: Active Ulcer/skin breakdown will have a volume reduction of 30% by week 4 Date Initiated: 01/08/2022 Date Inactivated: 02/18/2022 Target Resolution Date: 02/05/2022 Goal Status: Unmet Unmet Reason: VAC leaking Ulcer/skin breakdown will have a volume reduction of 50% by week 8 Date Initiated: 02/18/2022 Date Inactivated: 03/04/2022 Target Resolution Date: 03/05/2022 Goal Status: Unmet Unmet Reason: infection Ulcer/skin breakdown will have a volume reduction of 80% by week 12 Date Initiated: 03/04/2022 Date Inactivated: 04/01/2022 Target Resolution Date: 04/02/2022 Goal Status: Unmet Unmet Reason: too much moisture Interventions: Assess patient/caregiver ability to obtain necessary supplies Assess patient/caregiver ability to perform ulcer/skin care regimen upon admission and as needed Assess ulceration(s) every visit Provide education on ulcer and skin care Treatment Activities: Skin care regimen initiated : 01/08/2022 Topical wound management initiated : 01/08/2022 Notes: Electronic Signature(s) Signed: 05/13/2022 5:54:13 PM By:  Baruch Gouty RN, BSN Entered By: Baruch Gouty on 05/13/2022 13:30:37 -------------------------------------------------------------------------------- Pain Assessment Details Patient Name: Date of Service: [name] 05/13/2022 1:15 PM Medical Record Number: [id number] Patient Account Number: [id number] Date of Birth/Sex: Treating RN: [birth year] (56 y.o. [name] Primary Care Betrice Wanat: [place] Delphina Cahill Other Clinician: Referring Zurich Carreno: Treating Carlisle Enke/Extender: [name] [place] RRISO [name] Weeks in Treatment: 17 Active  Problems Location of Pain Severity and Description of Pain Patient Has Paino Yes Site Locations Pain Location: Generalized Pain With Dressing Change: No Duration of the Pain. Constant / Intermittento Constant Rate the pain. Current Pain Level: 8 Worst Pain Level: 9 Least Pain Level: 5 Character of Pain Describe the Pain: Aching Pain Management and Medication Current Pain Management: Medication: Yes Other: reposition Is the Current Pain Management Adequate: Adequate How does your wound impact your activities of daily livingo Sleep: No Bathing: No Appetite: No Relationship With Others: No Bladder Continence: No Emotions: No Bowel Continence: No Work: No Toileting: No Drive: No Dressing: No Hobbies: No Electronic Signature(s) Signed: 05/13/2022 5:54:13 PM By: Baruch Gouty RN, BSN Entered By: Baruch Gouty on 05/13/2022 13:23:34 -------------------------------------------------------------------------------- Patient/Caregiver Education Details Patient Name: Date of Service: [name], [name] 9/6/2023andnbsp1:15 PM Medical Record Number: [id number] Patient Account Number: [id number] Date of Birth/Gender: Treating RN: 11-Sep-1965 (56 y.o. [name] Primary Care Physician: [place] Delphina Cahill Other Clinician: Referring Physician: Treating Physician/Extender: [name] [place] RRISO Suzette Battiest in Treatment: 17 Education Assessment Education Provided To: Patient Education Topics Provided Elevated Blood Sugar/ Impact on Healing: Methods: Explain/Verbal Responses: Reinforcements needed, State content correctly Pressure: Methods: Explain/Verbal Responses: Reinforcements needed, State content correctly Wound/Skin Impairment: Methods: Explain/Verbal Responses: Reinforcements needed, State content correctly Electronic Signature(s) Signed: 05/13/2022 5:54:13 PM By: Baruch Gouty RN, BSN Entered By: Baruch Gouty on 05/13/2022  13:31:07 -------------------------------------------------------------------------------- Wound Assessment Details Patient Name: Date of Service: [name], [name] 05/13/2022 1:15 PM Medical Record Number: [id number] Patient Account Number: [id number] Date of Birth/Sex: Treating RN: 06-Aug-1966 (56 y.o. [name] Primary Care Lariah Fleer: [place] RRISO [name] Other Clinician: Referring Stacyann Mcconaughy: Treating Bryona Foxworthy/Extender: [name] [place] RRISO [name] Weeks in Treatment: 17 Wound Status Wound Number: 1 Primary Pressure Ulcer Etiology: Wound Location: Sacrum Wound Open Wounding Event: Pressure Injury Status: Date Acquired: 07/29/2021 Comorbid Congestive Heart Failure, Hypertension, Type II Diabetes, Weeks Of Treatment: 17 History: Osteomyelitis, Confinement Anxiety Clustered Wound: No Photos Wound Measurements Length: (cm) 1.4 Width: (cm) 1.6 Depth: (cm) 1.5 Area: (cm) 1.759 Volume: (cm) 2.639 % Reduction in Area: 70.1% % Reduction in Volume: 59.3% Epithelialization: Small (1-33%) Tunneling: No Undermining: Yes Starting Position (o'clock): 12 Ending Position (o'clock): 6 Maximum Distance: (cm) 1 Wound Description Classification: Category/Stage IV Wound Margin: Epibole Exudate Amount: Medium Exudate Type: Serosanguineous Exudate Color: red, brown Foul Odor After Cleansing: No Slough/Fibrino Yes Wound Bed Granulation Amount: Large (67-100%) Exposed Structure Granulation Quality: Red, Pink, Pale Fascia Exposed: No Necrotic Amount: Small (1-33%) Fat Layer (Subcutaneous Tissue) Exposed: Yes Necrotic Quality: Adherent Slough Tendon Exposed: No Muscle Exposed: No Joint Exposed: No Bone Exposed: No Treatment Notes Wound #1 (Sacrum) Cleanser Peri-Wound Care Triamcinolone 15 (g) Discharge Instruction: Use triamcinolone 15 (g)to rash daily as needed Zinc Oxide Ointment 30g tube Discharge Instruction: Apply Zinc Oxide to macerated periwound with each  dressing change Topical Keystone antibiotic compound Discharge Instruction: in base of wound under gauze Primary Dressing Medline Woven Gauze Sponges 4x4 (in/in) Discharge Instruction: pack into wound, moisten  with saline and Keystone compound Secondary Dressing MPM Excel SAP Bordered Dressing, 7x6.7 (Sacral) (in/in) Discharge Instruction: Apply silicone border over primary dressing as directed. Secured With Compression Wrap Compression Stockings Environmental education officer) Signed: 05/13/2022 4:47:34 PM By: Sharyn Creamer RN, BSN Entered By: Sharyn Creamer on 05/13/2022 13:28:57 -------------------------------------------------------------------------------- [place] Details Patient Name: Date of Service: [name], [name] 05/13/2022 1:15 PM Medical Record Number: [id number] Patient Account Number: [id number] Date of Birth/Sex: Treating RN: 1966/04/27 (56 y.o. [name] Primary Care Azaiah Mello: [place] Delphina Cahill Other Clinician: Referring Flynn Gwyn: Treating Corabelle Spackman/Extender: [name] [place] RRISO [name] Weeks in Treatment: 17 Vital Signs Time Taken: 13:23 Temperature (F): 97.6 Pulse (bpm): 101 Respiratory Rate (breaths/min): 18 Blood Pressure (mmHg): 151/80 Capillary Blood Glucose (mg/dl): 165 Reference Range: 80 - 120 mg / dl Notes glucose per pt report yesterday Electronic Signature(s) Signed: 05/13/2022 5:54:13 PM By: Baruch Gouty RN, BSN Entered By: Baruch Gouty on 05/13/2022 13:24:45

## 2022-05-13 NOTE — Progress Notes (Signed)
[name], [name] ([id number]) Visit Report for 05/13/2022 Chief Complaint Document Details Patient Name: Date of Service: [name], [name] 05/13/2022 1:15 PM Medical Record Number: [id number] Patient Account Number: [id number] Date of Birth/Sex: Treating RN: 12-11-1965 (56 y.o. M) Primary Care Provider: [place] RRISO [name] Other Clinician: Referring Provider: Treating Provider/Extender: [name] [name] Weeks in Treatment: 17 Information Obtained from: Patient Chief Complaint Patient is at the clinic for treatment of an open pressure ulcer Electronic Signature(s) Signed: 05/13/2022 1:46:03 PM By: [name] Maudlin MD FACS Entered By: [name] on 05/13/2022 13:46:03 -------------------------------------------------------------------------------- Debridement Details Patient Name: Date of Service: [name], [name] 05/13/2022 1:15 PM Medical Record Number: [id number] Patient Account Number: [id number] Date of Birth/Sex: Treating RN: [birth year] (56 y.o. [name] Mention Primary Care Provider: [place] [name] Other Clinician: Referring Provider: Treating Provider/Extender: [name] [name] Weeks in Treatment: 17 Debridement Performed for Assessment: Wound #1 Sacrum Performed By: Physician [name] Maudlin, MD Debridement Type: Debridement Level of Consciousness (Pre-procedure): Awake and Alert Pre-procedure Verification/Time Out Yes - 13:35 Taken: Start Time: 13:36 Pain Control: Lidocaine 4% Topical Solution T Area Debrided (L x W): otal 2 (cm) x 2 (cm) = 4 (cm) Tissue and other material debrided: Non-Viable, Skin: Epidermis Level: Skin/Epidermis Debridement Description: Selective/Open Wound Instrument: Curette Bleeding: Minimum Hemostasis Achieved: Pressure Procedural Pain: 0 Post Procedural Pain: 0 Response to Treatment: Procedure was tolerated well Level of Consciousness (Post- Awake and Alert procedure): Post Debridement  Measurements of Total Wound Length: (cm) 1.4 Stage: Category/Stage IV Width: (cm) 1.6 Depth: (cm) 1.5 Volume: (cm) 2.639 Character of Wound/Ulcer Post Debridement: Improved Post Procedure Diagnosis Same as Pre-procedure Electronic Signature(s) Signed: 05/13/2022 1:50:14 PM By: [name] Maudlin MD FACS Signed: 05/13/2022 5:54:13 PM By: [name] Gouty RN, BSN Entered By: [name] on 05/13/2022 13:37:56 -------------------------------------------------------------------------------- HPI Details Patient Name: Date of Service: [name], [name] 05/13/2022 1:15 PM Medical Record Number: [id number] Patient Account Number: [id number] Date of Birth/Sex: Treating RN: 1966-09-05 (56 y.o. M) Primary Care Provider: [place] [name] Other Clinician: Referring Provider: Treating Provider/Extender: [name] [name] Weeks in Treatment: 17 History of Present Illness HPI Description: ADMISSION 01/08/2022 This is a 56 year old male with a past medical history notable for type 2 diabetes mellitus (last A1c was 8.6) congestive heart failure, hypertension, chronic pain, and bilateral below-knee amputations. His most recent amputation was in November 2022. While he was in the hospital, he developed a sacral pressure ulcer. He was subsequently in a skilled nursing facility for some time. He was discharged with home health and had been in a wound VAC, but was then admitted to the hospital last week when the wound appeared to be worsening. Apparently the periwound skin was macerated and the device that he had been using was leaking quite a bit. Evaluation while in the hospital included a consultation with infectious disease who did not think he had any evidence of osteomyelitis, plastic surgery who felt that he was not an appropriate flap candidate (their note also states that he is not interested in a flap) and wound care who took him out of the wound VAC and initiated wet-to-dry dressing  changes. He has a new wound VAC from [facility] on order, anticipated delivery today. The wound itself is fairly small and isolated to the sacrum. There is muscle exposed. No bone is appreciated but it is palpable beneath the surface. The muscle itself is bit pale and  there is heaped up epibole around the wound edges. No significant odor or drainage. 01/14/2022: His wound VAC was not initiated until this past Friday. He has not had any issues with the VAC but today we found that the bridge foam was applied directly to the skin rather than over a layer of adhesive drape. His home health nurse also requested that we consider applying silver collagen to the wound bed in addition to the VAC. There is still a little bit of heaped up senescent skin around the wound periphery. No significant change to the wound dimensions. 01/21/2022: No significant change to the wound dimensions. The senescent skin has not reaccumulated. The periwound skin remains a bit macerated but without any obvious breakdown. The wound surface itself has a shiny appearance with a little bit of slough accumulation; no true robust granulation tissue at this time. 01/28/2022: The wound is about the same size but a little bit shallower. There is a little bit of senescent skin reaccumulation at the cranial aspect. The periwound skin is red but not macerated and without any tissue breakdown. The wound still does not have the most robust surface. There is a bit of slough accumulation. 02/04/2022: The wound is a bit smaller and the undermining has come in somewhat. There continues to be granulation tissue formation within the wound bed. No significant slough accumulation and his periwound skin is in better condition. 02/18/2022: The wound stinks today. There is no obvious pus but the drainage and wound itself are malodorous. He continues to have heaped up tissue within the wound bed that is rather grayish and not particularly robust-appearing. He is very  angry today with his situation. 02/25/2022: Last week, in response to the odor coming from the wound, I took a culture and prescribed topical gentamicin as well as oral cefdinir. Apparently [facility] contacted him about a topical compounded antibiotic, but he did not realize this and he hung up on them. Today, the odor is no longer present. There is some senescent skin around the wound margins as well as continued heaped up granulation tissue in the center of the wound. The undermining continues to contract. 03/04/2022: The wound continues to contract and look better. He still has heaped up hypertrophic granulation tissue near the orifice. No odor was coming from the wound today. He is awaiting [place] delivery. 03/18/2022: 2-week follow-up. [facility] topical antibiotic has been initiated. The chemical cauterization of the hypertrophic granulation tissue was quite successful and the surface is much flatter today. He has heaped up senescent skin around the perimeter. His home health providers have figured out a way to keep the wound VAC from losing suction by bolstering with DuoDERM. Overall there has been significant improvement since our last visit. 04/01/2022: 2-week follow-up. The wound continues to contract. Once again, there is heaped up senescent skin around the perimeter. He has a little bit of skin breakdown in the distribution of the adhesive drape from the wound VAC. No odor or purulent drainage. No concern for infection. 04/15/2022: 2-week follow-up. He has developed a fairly substantial rash from the drape adhesive for his wound VAC. The periwound has a lot of heaped up epiboly at the margins. The tissue in the wound bed is a little bit pale but there is no odor or purulent drainage. 04/22/2022: Last week we discontinued the VAC. Today, the skin around his wound is in significantly improved condition. His rash is resolving. He does have some heaped up senescent skin around the wound margin and a  layer of slough on the wound surface, but there is no concern for active infection. 04/29/2022: The wound continues to contract. The surface is clean. He continues to build up senescent skin around the wound edges that subsequently get a little bit macerated. No concern for infection. 05/06/2022: The wound is smaller again today in all dimensions. The surface is clean. He has his usual accumulation of macerated senescent skin around the wound edges. 05/13/2022: Continued wound contracture. The undermining has decreased quite a bit. Clean surface. Senescent skin heaped up around the wound edges, as per usual. Electronic Signature(s) Signed: 05/13/2022 1:46:37 PM By: [name] Maudlin MD FACS Entered By: [name] on 05/13/2022 13:46:37 -------------------------------------------------------------------------------- Physical Exam Details Patient Name: Date of Service: [name], [name] 05/13/2022 1:15 PM Medical Record Number: [id number] Patient Account Number: [id number] Date of Birth/Sex: Treating RN: July 09, 1966 (56 y.o. M) Primary Care Provider: [place] RRISO [name] Other Clinician: Referring Provider: Treating Provider/Extender: [name] [name] RRISO N, RYA N Weeks in Treatment: 17 Constitutional Hypertensive, asymptomatic. Slightly tachycardic, asymptomatic. . . No acute distress.Marland Kitchen Respiratory Normal work of breathing on room air.. Notes 05/13/2022: Continued wound contracture. The undermining has decreased quite a bit. Clean surface. Senescent skin heaped up around the wound edges, as per usual. Electronic Signature(s) Signed: 05/13/2022 1:47:28 PM By: [name] Maudlin MD FACS Entered By: [name] on 05/13/2022 13:47:28 -------------------------------------------------------------------------------- Physician Orders Details Patient Name: Date of Service: [name], [name] 05/13/2022 1:15 PM Medical Record Number: [id number] Patient Account Number: [id number] Date of  Birth/Sex: Treating RN: [birth year] (56 y.o. [name] Mention Primary Care Provider: [place] [name] Other Clinician: Referring Provider: Treating Provider/Extender: [name] [name] RRISO Suzette Battiest in Treatment: 17 Verbal / Phone Orders: No Diagnosis Coding ICD-10 Coding Code Description L89.154 Pressure ulcer of sacral region, stage 4 Z89.512 Acquired absence of left leg below knee Z89.511 Acquired absence of right leg below knee Follow-up Appointments ppointment in 1 week. - Dr. Celine Ahr RM 1 with [facility] Return A Friday 9/15 @ 8:15 am Anesthetic Wound #1 Sacrum (In clinic) Topical Lidocaine 4% applied to wound bed Bathing/ Shower/ Hygiene May shower with protection but do not get wound dressing(s) wet. Off-Loading Gel mattress overlay (Group 1) Turn and reposition every 2 hours Home Health No change in wound care orders this week; continue Home Health for wound care. May utilize formulary equivalent dressing for wound treatment orders unless otherwise specified. Dressing changes to be completed by [place] on Monday / Wednesday / Friday except when patient has scheduled visit at [facility]. Other Home Health Orders/Instructions: - [facility] Wound Treatment Wound #1 - Sacrum Peri-Wound Care: Triamcinolone 15 (g) 3 x Per Week/30 Days Discharge Instructions: Use triamcinolone 15 (g)to rash daily as needed Peri-Wound Care: Zinc Oxide Ointment 30g tube 3 x Per Week/30 Days Discharge Instructions: Apply Zinc Oxide to macerated periwound with each dressing change Topical: [facility] antibiotic compound 3 x Per Week/30 Days Discharge Instructions: in base of wound under gauze Prim Dressing: Medline Woven Gauze Sponges 4x4 (in/in) 3 x Per Week/30 Days ary Discharge Instructions: pack into wound, moisten with saline and [facility] compound Secondary Dressing: MPM Excel SAP Bordered Dressing, 7x6.7 (Sacral) (in/in) 3 x Per Week/30 Days Discharge Instructions: Apply  silicone border over primary dressing as directed. Electronic Signature(s) Signed: 05/13/2022 1:50:14 PM By: [name] Maudlin MD FACS Entered By: [name] on 05/13/2022 13:47:36 -------------------------------------------------------------------------------- Problem List Details Patient Name: Date of Service: [name], [name] 05/13/2022 1:15  PM Medical Record Number: [id number] Patient Account Number: [id number] Date of Birth/Sex: Treating RN: Dec 21, 1965 (56 y.o. [name] Mention Primary Care Provider: [place] [name] Other Clinician: Referring Provider: Treating Provider/Extender: [name] [name] Weeks in Treatment: 17 Active Problems ICD-10 Encounter Code Description Active Date MDM Diagnosis L89.154 Pressure ulcer of sacral region, stage 4 01/08/2022 No Yes Z89.512 Acquired absence of left leg below knee 01/08/2022 No Yes Z89.511 Acquired absence of right leg below knee 01/08/2022 No Yes Inactive Problems Resolved Problems Electronic Signature(s) Signed: 05/13/2022 1:44:40 PM By: [name] Maudlin MD FACS Entered By: [name] on 05/13/2022 13:44:39 -------------------------------------------------------------------------------- Progress Note Details Patient Name: Date of Service: [name], [name] 05/13/2022 1:15 PM Medical Record Number: [id number] Patient Account Number: [id number] Date of Birth/Sex: Treating RN: 03/01/1966 (56 y.o. M) Primary Care Provider: [place] RRISO [name] Other Clinician: Referring Provider: Treating Provider/Extender: [name] [name] Weeks in Treatment: 17 Subjective Chief Complaint Information obtained from Patient Patient is at the clinic for treatment of an open pressure ulcer History of Present Illness (HPI) ADMISSION 01/08/2022 This is a 56 year old male with a past medical history notable for type 2 diabetes mellitus (last A1c was 8.6) congestive heart failure, hypertension, chronic pain, and  bilateral below-knee amputations. His most recent amputation was in November 2022. While he was in the hospital, he developed a sacral pressure ulcer. He was subsequently in a skilled nursing facility for some time. He was discharged with home health and had been in a wound VAC, but was then admitted to the hospital last week when the wound appeared to be worsening. Apparently the periwound skin was macerated and the device that he had been using was leaking quite a bit. Evaluation while in the hospital included a consultation with infectious disease who did not think he had any evidence of osteomyelitis, plastic surgery who felt that he was not an appropriate flap candidate (their note also states that he is not interested in a flap) and wound care who took him out of the wound VAC and initiated wet-to-dry dressing changes. He has a new wound VAC from [facility] on order, anticipated delivery today. The wound itself is fairly small and isolated to the sacrum. There is muscle exposed. No bone is appreciated but it is palpable beneath the surface. The muscle itself is bit pale and there is heaped up epibole around the wound edges. No significant odor or drainage. 01/14/2022: His wound VAC was not initiated until this past Friday. He has not had any issues with the VAC but today we found that the bridge foam was applied directly to the skin rather than over a layer of adhesive drape. His home health nurse also requested that we consider applying silver collagen to the wound bed in addition to the VAC. There is still a little bit of heaped up senescent skin around the wound periphery. No significant change to the wound dimensions. 01/21/2022: No significant change to the wound dimensions. The senescent skin has not reaccumulated. The periwound skin remains a bit macerated but without any obvious breakdown. The wound surface itself has a shiny appearance with a little bit of slough accumulation; no true robust  granulation tissue at this time. 01/28/2022: The wound is about the same size but a little bit shallower. There is a little bit of senescent skin reaccumulation at the cranial aspect. The periwound skin is red but not macerated and without any tissue breakdown. The  wound still does not have the most robust surface. There is a bit of slough accumulation. 02/04/2022: The wound is a bit smaller and the undermining has come in somewhat. There continues to be granulation tissue formation within the wound bed. No significant slough accumulation and his periwound skin is in better condition. 02/18/2022: The wound stinks today. There is no obvious pus but the drainage and wound itself are malodorous. He continues to have heaped up tissue within the wound bed that is rather grayish and not particularly robust-appearing. He is very angry today with his situation. 02/25/2022: Last week, in response to the odor coming from the wound, I took a culture and prescribed topical gentamicin as well as oral cefdinir. Apparently [facility] contacted him about a topical compounded antibiotic, but he did not realize this and he hung up on them. Today, the odor is no longer present. There is some senescent skin around the wound margins as well as continued heaped up granulation tissue in the center of the wound. The undermining continues to contract. 03/04/2022: The wound continues to contract and look better. He still has heaped up hypertrophic granulation tissue near the orifice. No odor was coming from the wound today. He is awaiting [place] delivery. 03/18/2022: 2-week follow-up. [facility] topical antibiotic has been initiated. The chemical cauterization of the hypertrophic granulation tissue was quite successful and the surface is much flatter today. He has heaped up senescent skin around the perimeter. His home health providers have figured out a way to keep the wound VAC from losing suction by bolstering with DuoDERM. Overall  there has been significant improvement since our last visit. 04/01/2022: 2-week follow-up. The wound continues to contract. Once again, there is heaped up senescent skin around the perimeter. He has a little bit of skin breakdown in the distribution of the adhesive drape from the wound VAC. No odor or purulent drainage. No concern for infection. 04/15/2022: 2-week follow-up. He has developed a fairly substantial rash from the drape adhesive for his wound VAC. The periwound has a lot of heaped up epiboly at the margins. The tissue in the wound bed is a little bit pale but there is no odor or purulent drainage. 04/22/2022: Last week we discontinued the VAC. Today, the skin around his wound is in significantly improved condition. His rash is resolving. He does have some heaped up senescent skin around the wound margin and a layer of slough on the wound surface, but there is no concern for active infection. 04/29/2022: The wound continues to contract. The surface is clean. He continues to build up senescent skin around the wound edges that subsequently get a little bit macerated. No concern for infection. 05/06/2022: The wound is smaller again today in all dimensions. The surface is clean. He has his usual accumulation of macerated senescent skin around the wound edges. 05/13/2022: Continued wound contracture. The undermining has decreased quite a bit. Clean surface. Senescent skin heaped up around the wound edges, as per usual. Patient History Information obtained from Patient, Caregiver, Chart. Family History Cancer - Father, Heart Disease - Father,Paternal Grandparents, Hypertension - Father, No family history of Diabetes, Hereditary Spherocytosis, Kidney Disease, Lung Disease, Seizures, Stroke, Thyroid Problems, Tuberculosis. Social History Never smoker, Marital Status - Divorced, Alcohol Use - Never, Drug Use - No History, Caffeine Use - Moderate - coffee. Medical History Eyes Denies history of  Cataracts, Glaucoma, Optic Neuritis Ear/Nose/Mouth/Throat Denies history of Chronic sinus problems/congestion, Middle ear problems Cardiovascular Patient has history of Congestive Heart Failure, Hypertension  Endocrine Patient has history of Type II Diabetes Denies history of Type I Diabetes Genitourinary Denies history of End Stage Renal Disease Integumentary (Skin) Denies history of History of Burn Musculoskeletal Patient has history of Osteomyelitis - S1 and coccyx Oncologic Denies history of Received Chemotherapy, Received Radiation Psychiatric Patient has history of Confinement Anxiety Denies history of Anorexia/bulimia Hospitalization/Surgery History - bil BKA. Medical A Surgical History Notes nd Respiratory pulmonary nodules Genitourinary renal mass, urinary hesitancy Musculoskeletal discitis of thoracic region Objective Constitutional Hypertensive, asymptomatic. Slightly tachycardic, asymptomatic. No acute distress.. Vitals Time Taken: 1:23 PM, Temperature: 97.6 F, Pulse: 101 bpm, Respiratory Rate: 18 breaths/min, Blood Pressure: 151/80 mmHg, Capillary Blood Glucose: 165 mg/dl. General Notes: glucose per pt report yesterday Respiratory Normal work of breathing on room air.. General Notes: 05/13/2022: Continued wound contracture. The undermining has decreased quite a bit. Clean surface. Senescent skin heaped up around the wound edges, as per usual. Integumentary (Hair, Skin) Wound #1 status is Open. Original cause of wound was Pressure Injury. The date acquired was: 07/29/2021. The wound has been in treatment 17 weeks. The wound is located on the Sacrum. The wound measures 1.4cm length x 1.6cm width x 1.5cm depth; 1.759cm^2 area and 2.639cm^3 volume. There is Fat Layer (Subcutaneous Tissue) exposed. There is no tunneling noted, however, there is undermining starting at 12:00 and ending at 6:00 with a maximum distance of 1cm. There is a medium amount of serosanguineous  drainage noted. The wound margin is epibole. There is large (67-100%) red, pink, pale granulation within the wound bed. There is a small (1-33%) amount of necrotic tissue within the wound bed including Adherent Slough. 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 There was a Selective/Open Wound Skin/Epidermis Debridement with a total area of 4 sq cm performed by [name] Maudlin, MD. With the following instrument(s): Curette to remove Non-Viable tissue/material. Material removed includes Skin: Epidermis after achieving pain control using Lidocaine 4% Topical Solution. No specimens were taken. A time out was conducted at 13:35, prior to the start of the procedure. A Minimum amount of bleeding was controlled with Pressure. The procedure was tolerated well with a pain level of 0 throughout and a pain level of 0 following the procedure. Post Debridement Measurements: 1.4cm length x 1.6cm width x 1.5cm depth; 2.639cm^3 volume. Post debridement Stage noted as Category/Stage IV. Character of Wound/Ulcer Post Debridement is improved. Post procedure Diagnosis Wound #1: Same as Pre-Procedure Plan Follow-up Appointments: Return Appointment in 1 week. - Dr. Celine Ahr RM 1 with [facility] Friday 9/15 @ 8:15 am Anesthetic: Wound #1 Sacrum: (In clinic) Topical Lidocaine 4% applied to wound bed Bathing/ Shower/ Hygiene: May shower with protection but do not get wound dressing(s) wet. Off-Loading: Gel mattress overlay (Group 1) Turn and reposition every 2 hours Home Health: No change in wound care orders this week; continue Home Health for wound care. May utilize formulary equivalent dressing for wound treatment orders unless otherwise specified. Dressing changes to be completed by [place] on  Monday / Wednesday / Friday except when patient has scheduled visit at [facility]. Other Home Health Orders/Instructions: - [facility] WOUND #1: - Sacrum Wound Laterality: Peri-Wound Care: Triamcinolone 15 (g) 3 x Per Week/30 Days Discharge Instructions: Use triamcinolone 15 (g)to rash daily as needed Peri-Wound Care: Zinc Oxide Ointment 30g tube  3 x Per Week/30 Days Discharge Instructions: Apply Zinc Oxide to macerated periwound with each dressing change Topical: [facility] antibiotic compound 3 x Per Week/30 Days Discharge Instructions: in base of wound under gauze Prim Dressing: Medline Woven Gauze Sponges 4x4 (in/in) 3 x Per Week/30 Days ary Discharge Instructions: pack into wound, moisten with saline and [facility] compound Secondary Dressing: MPM Excel SAP Bordered Dressing, 7x6.7 (Sacral) (in/in) 3 x Per Week/30 Days Discharge Instructions: Apply silicone border over primary dressing as directed. 05/13/2022: Continued wound contracture. The undermining has decreased quite a bit. Clean surface. Senescent skin heaped up around the wound edges, as per usual. I used a curette to debride the senescent skin from around the wound edges. We will continue to use gauze moistened with his Keystone topical compounded antibiotic mixture. Follow-up in 1 week. Electronic Signature(s) Signed: 05/13/2022 1:48:04 PM By: [name] Maudlin MD FACS Entered By: [name] on 05/13/2022 13:48:04 -------------------------------------------------------------------------------- HxROS Details Patient Name: Date of Service: [name], [name] 05/13/2022 1:15 PM Medical Record Number: [id number] Patient Account Number: [id number] Date of Birth/Sex: Treating RN: 02/19/1966 (56 y.o. M) Primary Care Provider: [place] RRISO [name] Other Clinician: Referring Provider: Treating Provider/Extender: [name] [name] Weeks in Treatment: 17 Information Obtained From Patient Caregiver  Chart Eyes Medical History: Negative for: Cataracts; Glaucoma; Optic Neuritis Ear/Nose/Mouth/Throat Medical History: Negative for: Chronic sinus problems/congestion; Middle ear problems Respiratory Medical History: Past Medical History Notes: pulmonary nodules Cardiovascular Medical History: Positive for: Congestive Heart Failure; Hypertension Endocrine Medical History: Positive for: Type II Diabetes Negative for: Type I Diabetes Time with diabetes: since 2004 Treated with: Insulin Blood sugar tested every day: Yes Tested : 4 times per day Genitourinary Medical History: Negative for: End Stage Renal Disease Past Medical History Notes: renal mass, urinary hesitancy Integumentary (Skin) Medical History: Negative for: History of Burn Musculoskeletal Medical History: Positive for: Osteomyelitis - S1 and coccyx Past Medical History Notes: discitis of thoracic region Oncologic Medical History: Negative for: Received Chemotherapy; Received Radiation Psychiatric Medical History: Positive for: Confinement Anxiety Negative for: Anorexia/bulimia Immunizations Pneumococcal Vaccine: Received Pneumococcal Vaccination: No Implantable Devices No devices added Hospitalization / Surgery History Type of Hospitalization/Surgery bil BKA Family and Social History Cancer: Yes - Father; Diabetes: No; Heart Disease: Yes - Father,Paternal Grandparents; Hereditary Spherocytosis: No; Hypertension: Yes - Father; Kidney Disease: No; Lung Disease: No; Seizures: No; Stroke: No; Thyroid Problems: No; Tuberculosis: No; Never smoker; Marital Status - Divorced; Alcohol Use: Never; Drug Use: No History; Caffeine Use: Moderate - coffee; Financial Concerns: No; Food, Clothing or Shelter Needs: No; Support System Lacking: No; Transportation Concerns: Yes - hurts to transfer Electronic Signature(s) Signed: 05/13/2022 1:50:14 PM By: [name] Maudlin MD FACS Entered By: [name] on 05/13/2022  13:46:42 -------------------------------------------------------------------------------- SuperBill Details Patient Name: Date of Service: [name], [place] 05/13/2022 Medical Record Number: [id number] Patient Account Number: [id number] Date of Birth/Sex: Treating RN: 1965/10/16 (56 y.o. 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 Facility Procedures CPT4 Code: 16073710 Description: 62694 - DEBRIDE WOUND 1ST 20 SQ CM OR < ICD-10 Diagnosis Description L89.154 Pressure ulcer of sacral region, stage 4 Modifier: Quantity: 1 Physician Procedures : CPT4 Code Description Modifier [id number] - WC PHYS LEVEL  4 - EST PT 25 ICD-10 Diagnosis Description L89.154 Pressure ulcer of sacral region, stage 4 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

## 2022-05-18 DIAGNOSIS — G4700 Insomnia, unspecified: Secondary | ICD-10-CM | POA: Diagnosis not present

## 2022-05-18 DIAGNOSIS — E1142 Type 2 diabetes mellitus with diabetic polyneuropathy: Secondary | ICD-10-CM | POA: Diagnosis not present

## 2022-05-18 DIAGNOSIS — I5022 Chronic systolic (congestive) heart failure: Secondary | ICD-10-CM | POA: Diagnosis not present

## 2022-05-18 DIAGNOSIS — I130 Hypertensive heart and chronic kidney disease with heart failure and stage 1 through stage 4 chronic kidney disease, or unspecified chronic kidney disease: Secondary | ICD-10-CM | POA: Diagnosis not present

## 2022-05-18 DIAGNOSIS — L89154 Pressure ulcer of sacral region, stage 4: Secondary | ICD-10-CM | POA: Diagnosis not present

## 2022-05-18 DIAGNOSIS — E1122 Type 2 diabetes mellitus with diabetic chronic kidney disease: Secondary | ICD-10-CM | POA: Diagnosis not present

## 2022-05-18 DIAGNOSIS — E291 Testicular hypofunction: Secondary | ICD-10-CM | POA: Diagnosis not present

## 2022-05-18 DIAGNOSIS — E785 Hyperlipidemia, unspecified: Secondary | ICD-10-CM | POA: Diagnosis not present

## 2022-05-18 DIAGNOSIS — N1831 Chronic kidney disease, stage 3a: Secondary | ICD-10-CM | POA: Diagnosis not present

## 2022-05-18 DIAGNOSIS — E1165 Type 2 diabetes mellitus with hyperglycemia: Secondary | ICD-10-CM | POA: Diagnosis not present

## 2022-05-18 DIAGNOSIS — I5042 Chronic combined systolic (congestive) and diastolic (congestive) heart failure: Secondary | ICD-10-CM | POA: Diagnosis not present

## 2022-05-18 DIAGNOSIS — I4892 Unspecified atrial flutter: Secondary | ICD-10-CM | POA: Diagnosis not present

## 2022-05-20 ENCOUNTER — Telehealth: Payer: Self-pay | Admitting: Internal Medicine

## 2022-05-20 DIAGNOSIS — L89154 Pressure ulcer of sacral region, stage 4: Secondary | ICD-10-CM | POA: Diagnosis not present

## 2022-05-20 DIAGNOSIS — E291 Testicular hypofunction: Secondary | ICD-10-CM | POA: Diagnosis not present

## 2022-05-20 DIAGNOSIS — J9601 Acute respiratory failure with hypoxia: Secondary | ICD-10-CM | POA: Diagnosis not present

## 2022-05-20 DIAGNOSIS — L89159 Pressure ulcer of sacral region, unspecified stage: Secondary | ICD-10-CM | POA: Diagnosis not present

## 2022-05-20 NOTE — Telephone Encounter (Signed)
These forms have been received.

## 2022-05-20 NOTE — Telephone Encounter (Signed)
Form was resent to [place] and needs to be signed again.    .Home Health Certification or Plan of Care Tracking  Is this a Certification or Plan of Care? yes  [facility] Agency: [place]  Order Number:  [id number]  Has charge sheet been attached? yes  Where has form been placed:  In provider's box  Faxed to:   [phone]

## 2022-05-20 NOTE — Telephone Encounter (Signed)
..  Home Health Verbal Orders  Agency:  [facility]   Caller: Melissa   Contact and title  Requesting OT/ PT/ Skilled nursing/ Social Work/ Speech:  Requesting verbal to discharge home health OT services (can leave vm)  Reason for Request:    Frequency:    HH needs F2F w/in last 30 days

## 2022-05-22 ENCOUNTER — Encounter (HOSPITAL_BASED_OUTPATIENT_CLINIC_OR_DEPARTMENT_OTHER): Payer: Medicare HMO | Admitting: General Surgery

## 2022-05-22 DIAGNOSIS — Z89511 Acquired absence of right leg below knee: Secondary | ICD-10-CM | POA: Diagnosis not present

## 2022-05-22 DIAGNOSIS — L89154 Pressure ulcer of sacral region, stage 4: Secondary | ICD-10-CM | POA: Diagnosis not present

## 2022-05-22 DIAGNOSIS — I509 Heart failure, unspecified: Secondary | ICD-10-CM | POA: Diagnosis not present

## 2022-05-22 DIAGNOSIS — Z89512 Acquired absence of left leg below knee: Secondary | ICD-10-CM | POA: Diagnosis not present

## 2022-05-22 DIAGNOSIS — I110 Hypertensive heart disease with heart failure: Secondary | ICD-10-CM | POA: Diagnosis not present

## 2022-05-22 DIAGNOSIS — G8929 Other chronic pain: Secondary | ICD-10-CM | POA: Diagnosis not present

## 2022-05-22 DIAGNOSIS — E11622 Type 2 diabetes mellitus with other skin ulcer: Secondary | ICD-10-CM | POA: Diagnosis not present

## 2022-05-22 NOTE — Progress Notes (Signed)
[name], [name] ([id number]) Visit Report for 05/22/2022 Arrival Information Details Patient Name: Date of Service: [name] [name] 05/22/2022 8:15 A M Medical Record Number: [id number] Patient Account Number: [id number] Date of Birth/Sex: Treating RN: 05-Oct-1965 (56 y.o. [name] Primary Care [name]: [place] [name] Other Clinician: Referring [name]: Treating [name] [place] RRISO [name] in Treatment: 22 Visit Information History Since Last Visit Added or deleted any medications: No Patient Arrived: Wheel Chair Any new allergies or adverse reactions: No Arrival Time: 08:26 Had a fall or experienced change in No Accompanied By: friend activities of daily living that may affect Transfer Assistance: None risk of falls: Patient Identification Verified: Yes Signs or symptoms of abuse/neglect since last visito No Secondary Verification Process Completed: Yes Hospitalized since last visit: No Patient Requires Transmission-Based Precautions: No Implantable device outside of the clinic excluding No Patient Has Alerts: No cellular tissue based products placed in the center since last visit: Has Dressing in Place as Prescribed: Yes Pain Present Now: Yes Electronic Signature(s) Signed: 05/22/2022 3:57:07 PM By: [name] Gouty RN, BSN Entered By: [name] on 05/22/2022 08:35:03 -------------------------------------------------------------------------------- Encounter Discharge Information Details Patient Name: Date of Service: [name], [name] 05/22/2022 8:15 A M Medical Record Number: [id number] Patient Account Number: [id number] Date of Birth/Sex: Treating RN: Apr 22, 1966 (56 y.o. [name] Primary Care [name]: [place] [name] Other Clinician: Referring Sumeya Yontz: Treating Jadene Stemmer/Extender: [name] [name] in Treatment: 74 Encounter Discharge Information Items Post Procedure Vitals Discharge  Condition: Stable Temperature (F): 98 Ambulatory Status: Wheelchair Pulse (bpm): 79 Discharge Destination: Home Respiratory Rate (breaths/min): 18 Transportation: Private Auto Blood Pressure (mmHg): 150/92 Accompanied By: friend Schedule Follow-up Appointment: Yes Clinical Summary of Care: Patient Declined Electronic Signature(s) Signed: 05/22/2022 3:57:07 PM By: [name] Gouty RN, BSN Entered By: [name] on 05/22/2022 09:48:09 -------------------------------------------------------------------------------- Lower Extremity Assessment Details Patient Name: Date of Service: [facility], [name] 05/22/2022 8:15 A M Medical Record Number: [id number] Patient Account Number: [id number] Date of Birth/Sex: Treating RN: [birth year] (56 y.o. [name] Primary Care Najwa Spillane: [place] [name] Other Clinician: Referring Jennene Downie: Treating Analiz Tvedt/Extender: [name] [name] in Treatment: 19 Electronic Signature(s) Signed: 05/22/2022 3:57:07 PM By: [name] Gouty RN, BSN Entered By: [name] on 05/22/2022 08:37:29 -------------------------------------------------------------------------------- Multi Wound Chart Details Patient Name: Date of Service: [name], [name] 05/22/2022 8:15 A M Medical Record Number: [id number] Patient Account Number: [id number] Date of Birth/Sex: Treating RN: [birth year] (56 y.o. M) Primary Care [name]: [place] RRISO [name] Other Clinician: Referring [name]: Treating [name]/Extender: [name] [name] in Treatment: 19 Vital Signs Height(in): Capillary Blood Glucose(mg/dl): 165 Weight(lbs): Pulse(bpm): 52 Body Mass Index(BMI): Blood Pressure(mmHg): 150/92 Temperature(F): 98 Respiratory Rate(breaths/min): 18 Photos: [N/A:N/A] Sacrum N/A N/A Wound Location: Pressure Injury N/A N/A Wounding Event: Pressure Ulcer N/A N/A Primary Etiology: Congestive Heart Failure, N/A N/A Comorbid  History: Hypertension, Type II Diabetes, Osteomyelitis, Confinement Anxiety 07/29/2021 N/A N/A Date Acquired: 79 N/A N/A [name] of Treatment: Open N/A N/A Wound Status: No N/A N/A Wound Recurrence: 1.3x1.7x1.3 N/A N/A Measurements L x W x D (cm) 1.736 N/A N/A A (cm) : rea 2.256 N/A N/A Volume (cm) : 70.50% N/A N/A % Reduction in A rea: 65.20% N/A N/A % Reduction in Volume: 12 Starting Position 1 (o'clock): 8 Ending Position 1 (o'clock): 1.3 Maximum Distance 1 (cm): Yes N/A N/A Undermining: Category/Stage IV N/A  N/A Classification: Medium N/A N/A Exudate A mount: Serosanguineous N/A N/A Exudate Type: red, brown N/A N/A Exudate Color: Epibole N/A N/A Wound Margin: Large (67-100%) N/A N/A Granulation Amount: Red, Pink, Pale N/A N/A Granulation Quality: Small (1-33%) N/A N/A Necrotic Amount: Fat Layer (Subcutaneous Tissue): Yes N/A N/A Exposed Structures: Fascia: No Tendon: No Muscle: No Joint: No Bone: No Small (1-33%) N/A N/A Epithelialization: Debridement - Selective/Open Wound N/A N/A Debridement: Pre-procedure Verification/Time Out 08:55 N/A N/A Taken: Lidocaine 5% topical ointment N/A N/A Pain Control: Skin/Epidermis N/A N/A Level: 2.21 N/A N/A Debridement A (sq cm): rea Curette N/A N/A Instrument: Minimum N/A N/A Bleeding: Pressure N/A N/A Hemostasis A chieved: 0 N/A N/A Procedural Pain: 0 N/A N/A Post Procedural Pain: Procedure was tolerated well N/A N/A Debridement Treatment Response: 1.3x1.7x1.3 N/A N/A Post Debridement Measurements L x W x D (cm) 2.256 N/A N/A Post Debridement Volume: (cm) Category/Stage IV N/A N/A Post Debridement Stage: Debridement N/A N/A Procedures Performed: Treatment Notes Electronic Signature(s) Signed: 05/22/2022 9:02:49 AM By: [name] Entered By: [name] on 05/22/2022  09:02:49 -------------------------------------------------------------------------------- Multi-Disciplinary Care Plan Details Patient Name: Date of Service: [name], [name] 05/22/2022 8:15 A M Medical Record Number: [id number] Patient Account Number: [id number] Date of Birth/Sex: Treating RN: [birth year] (56 y.o. [name] Primary Care Shariyah Eland: [place] [name] Other Clinician: Referring Taina Landry: Treating Joshaua Epple/Extender: [name] [place] RRISO [name] in Treatment: [place] reviewed with physician Active Inactive Nutrition Nursing Diagnoses: Impaired glucose control: actual or potential Potential for alteratiion in Nutrition/Potential for imbalanced nutrition Goals: Patient/caregiver will maintain therapeutic glucose control Date Initiated: 01/08/2022 Target Resolution Date: 05/28/2022 Goal Status: Active Interventions: Assess HgA1c results as ordered upon admission and as needed Assess patient nutrition upon admission and as needed per policy Provide education on elevated blood sugars and impact on wound healing Treatment Activities: Dietary management education, guidance and counseling : 01/08/2022 Patient referred to Primary Care Physician for further nutritional evaluation : 01/08/2022 Notes: Pressure Nursing Diagnoses: Knowledge deficit related to causes and risk factors for pressure ulcer development Knowledge deficit related to management of pressures ulcers Potential for impaired tissue integrity related to pressure, friction, moisture, and shear Goals: Patient/caregiver will verbalize understanding of pressure ulcer management Date Initiated: 01/08/2022 Target Resolution Date: 05/28/2022 Goal Status: Active Interventions: Assess: immobility, friction, shearing, incontinence upon admission and as needed Assess offloading mechanisms upon admission and as needed Assess potential for pressure ulcer upon admission and as  needed Notes: Wound/Skin Impairment Nursing Diagnoses: Impaired tissue integrity Knowledge deficit related to ulceration/compromised skin integrity Goals: Patient/caregiver will verbalize understanding of skin care regimen Date Initiated: 01/08/2022 Target Resolution Date: 05/28/2022 Goal Status: Active Ulcer/skin breakdown will have a volume reduction of 30% by week 4 Date Initiated: 01/08/2022 Date Inactivated: 02/18/2022 Target Resolution Date: 02/05/2022 Goal Status: Unmet Unmet Reason: VAC leaking Ulcer/skin breakdown will have a volume reduction of 50% by week 8 Date Initiated: 02/18/2022 Date Inactivated: 03/04/2022 Target Resolution Date: 03/05/2022 Goal Status: Unmet Unmet Reason: infection Ulcer/skin breakdown will have a volume reduction of 80% by week 12 Date Initiated: 03/04/2022 Date Inactivated: 04/01/2022 Target Resolution Date: 04/02/2022 Goal Status: Unmet Unmet Reason: too much moisture Interventions: Assess patient/caregiver ability to obtain necessary supplies Assess patient/caregiver ability to perform ulcer/skin care regimen upon admission and as needed Assess ulceration(s) every visit Provide education on ulcer and skin care Treatment Activities: Skin care regimen initiated : 01/08/2022 Topical wound management initiated : 01/08/2022 Notes: Electronic Signature(s)  Signed: 05/22/2022 3:57:07 PM By: [name] Gouty RN, BSN Entered By: [name] on 05/22/2022 08:48:36 -------------------------------------------------------------------------------- Pain Assessment Details Patient Name: Date of Service: [name], [name] 05/22/2022 8:15 A M Medical Record Number: [id number] Patient Account Number: [id number] Date of Birth/Sex: Treating RN: 10-22-1965 (56 y.o. [name] Primary Care Lydie Stammen: [place] [name] Other Clinician: Referring [name]: Treating Carsen Machi/Extender: [name] [name] in Treatment: 19 Active  Problems Location of Pain Severity and Description of Pain Patient Has Paino Yes Site Locations Pain Location: Generalized Pain With Dressing Change: No Duration of the Pain. Constant / Intermittento Constant Rate the pain. Current Pain Level: 5 Worst Pain Level: 7 Character of Pain Describe the Pain: Aching Pain Management and Medication Current Pain Management: Medication: Yes Other: reposition Is the Current Pain Management Adequate: Adequate How does your wound impact your activities of daily livingo Sleep: No Bathing: No Appetite: No Relationship With Others: No Bladder Continence: No Emotions: No Bowel Continence: No Work: No Toileting: No Drive: No Dressing: No Hobbies: No Electronic Signature(s) Signed: 05/22/2022 3:57:07 PM By: [name] Gouty RN, BSN Entered By: [name] on 05/22/2022 08:37:23 -------------------------------------------------------------------------------- Patient/Caregiver Education Details Patient Name: Date of Service: [name], [name] 9/15/2023andnbsp8:15 A M Medical Record Number: [id number] Patient Account Number: [id number] Date of Birth/Gender: Treating RN: 1966-04-28 (56 y.o. [name] Primary Care Physician: [place] [name] Other Clinician: Referring Physician: Treating Physician/Extender: [name] [place] RRISO [name] in Treatment: 73 Education Assessment Education Provided To: Patient Education Topics Provided Elevated Blood Sugar/ Impact on Healing: Methods: Explain/Verbal Responses: Reinforcements needed Pressure: Methods: Explain/Verbal Responses: Reinforcements needed, State content correctly Wound/Skin Impairment: Methods: Explain/Verbal Responses: Reinforcements needed, State content correctly Electronic Signature(s) Signed: 05/22/2022 3:57:07 PM By: [name] Gouty RN, BSN Entered By: [name] on 05/22/2022  08:49:06 -------------------------------------------------------------------------------- Wound Assessment Details Patient Name: Date of Service: [name], [name] 05/22/2022 8:15 A M Medical Record Number: [id number] Patient Account Number: [id number] Date of Birth/Sex: Treating RN: [birth year] (56 y.o. [name] Primary Care Nino Amano: [place] [name] Other Clinician: Referring Janeka Libman: Treating Ayn Domangue/Extender: [name] [name] in Treatment: 19 Wound Status Wound Number: 1 Primary Pressure Ulcer Etiology: Wound Location: Sacrum Wound Open Wounding Event: Pressure Injury Status: Date Acquired: 07/29/2021 Comorbid Congestive Heart Failure, Hypertension, Type II Diabetes, [name] Of Treatment: 19 History: Osteomyelitis, Confinement Anxiety Clustered Wound: No Photos Wound Measurements Length: (cm) 1.3 Width: (cm) 1.7 Depth: (cm) 1.3 Area: (cm) 1.736 Volume: (cm) 2.256 % Reduction in Area: 70.5% % Reduction in Volume: 65.2% Epithelialization: Small (1-33%) Tunneling: No Undermining: Yes Starting Position (o'clock): 12 Ending Position (o'clock): 8 Maximum Distance: (cm) 1.3 Wound Description Classification: Category/Stage IV Wound Margin: Epibole Exudate Amount: Medium Exudate Type: Serosanguineous Exudate Color: red, brown Foul Odor After Cleansing: No Slough/Fibrino Yes Wound Bed Granulation Amount: Large (67-100%) Exposed Structure Granulation Quality: Red, Pink, Pale Fascia Exposed: No Necrotic Amount: Small (1-33%) Fat Layer (Subcutaneous Tissue) Exposed: Yes Necrotic Quality: Adherent Slough Tendon Exposed: No Muscle Exposed: No Joint Exposed: No Bone Exposed: No Treatment Notes Wound #1 (Sacrum) Cleanser Peri-Wound Care Triamcinolone 15 (g) Discharge Instruction: Use triamcinolone 15 (g)to rash daily as needed Zinc Oxide Ointment 30g tube Discharge Instruction: Apply Zinc Oxide to macerated periwound with each  dressing change as needed Topical Keystone antibiotic compound Discharge Instruction: in base of wound under gauze Primary Dressing Medline Woven Gauze Sponges 4x4 (in/in) Discharge Instruction: pack  into wound, moisten with saline and Keystone compound, smaller dressing over wound to hold packing in wouind Secondary Dressing MPM Excel SAP Bordered Dressing, 7x6.7 (Sacral) (in/in) Discharge Instruction: Apply silicone border over primary dressing as directed. Secured With Compression Wrap Compression Stockings Environmental education officer) Signed: 05/22/2022 3:57:07 PM By: [name] Gouty RN, BSN Entered By: [name] on 05/22/2022 08:52:02 -------------------------------------------------------------------------------- Vitals Details Patient Name: Date of Service: [name], [name] 05/22/2022 8:15 A M Medical Record Number: [id number] Patient Account Number: [id number] Date of Birth/Sex: Treating RN: July 17, 1966 (56 y.o. [name] Primary Care Chivas Notz: [place] [name] Other Clinician: Referring Kenslei Hearty: Treating Devonne Lalani/Extender: [name] [name] in Treatment: 19 Vital Signs Time Taken: 08:35 Temperature (F): 98 Pulse (bpm): 79 Respiratory Rate (breaths/min): 18 Blood Pressure (mmHg): 150/92 Capillary Blood Glucose (mg/dl): 165 Reference Range: 80 - 120 mg / dl Notes glucose per pt report yesterday Electronic Signature(s) Signed: 05/22/2022 3:57:07 PM By: [name] Gouty RN, BSN Entered By: [name] on 05/22/2022 08:36:16

## 2022-05-22 NOTE — Progress Notes (Signed)
[name], HALPIN ([id number]) Visit Report for 05/22/2022 Chief Complaint Document Details Patient Name: Date of Service: [place], [name] 05/22/2022 8:15 A M Medical Record Number: [id number] Patient Account Number: [id number] Date of Birth/Sex: Treating RN: [birth year] (56 y.o. M) Primary Care Provider: MO RRISO [name] Other Clinician: Referring Provider: Treating Provider/Extender: [name] MO RRISO [name] Weeks in Treatment: 73 Information Obtained from: Patient Chief Complaint Patient is at the clinic for treatment of an open pressure ulcer Electronic Signature(s) Signed: 05/22/2022 9:03:36 AM By: [name] MD FACS Entered By: [name] on 05/22/2022 09:03:36 -------------------------------------------------------------------------------- Debridement Details Patient Name: Date of Service: [name], [name] 05/22/2022 8:15 A M Medical Record Number: [id number] Patient Account Number: [id number] Date of Birth/Sex: Treating RN: [birth year] (56 y.o. [name] Primary Care Provider: MO Delphina Cahill Other Clinician: Referring Provider: Treating Provider/Extender: [name] MO RRISO [name] Weeks in Treatment: 19 Debridement Performed for Assessment: Wound #1 Sacrum Performed By: Physician [name], MD Debridement Type: Debridement Level of Consciousness (Pre-procedure): Awake and Alert Pre-procedure Verification/Time Out Yes - 08:55 Taken: Start Time: 08:57 Pain Control: Lidocaine 5% topical ointment T Area Debrided (L x W): otal 1.3 (cm) x 1.7 (cm) = 2.21 (cm) Tissue and other material debrided: Non-Viable, Skin: Epidermis Level: Skin/Epidermis Debridement Description: Selective/Open Wound Instrument: Curette Bleeding: Minimum Hemostasis Achieved: Pressure Procedural Pain: 0 Post Procedural Pain: 0 Response to Treatment: Procedure was tolerated well Level of Consciousness (Post- Awake and Alert procedure): Post  Debridement Measurements of Total Wound Length: (cm) 1.3 Stage: Category/Stage IV Width: (cm) 1.7 Depth: (cm) 1.3 Volume: (cm) 2.256 Character of Wound/Ulcer Post Debridement: Improved Post Procedure Diagnosis Same as Pre-procedure Electronic Signature(s) Signed: 05/22/2022 10:03:44 AM By: [name] MD FACS Signed: 05/22/2022 3:57:07 PM By: Baruch Gouty RN, BSN Entered By: Baruch Gouty on 05/22/2022 08:59:17 -------------------------------------------------------------------------------- HPI Details Patient Name: Date of Service: [name], [name] 05/22/2022 8:15 A M Medical Record Number: [id number] Patient Account Number: [id number] Date of Birth/Sex: Treating RN: 01/02/1966 (56 y.o. M) Primary Care Provider: MO Delphina Cahill Other Clinician: Referring Provider: Treating Provider/Extender: [name] MO RRISO [name] Weeks in Treatment: 19 History of Present Illness HPI Description: ADMISSION 01/08/2022 This is a 56 year old male with a past medical history notable for type 2 diabetes mellitus (last A1c was 8.6) congestive heart failure, hypertension, chronic pain, and bilateral below-knee amputations. His most recent amputation was in November 2022. While he was in the hospital, he developed a sacral pressure ulcer. He was subsequently in a skilled nursing facility for some time. He was discharged with home health and had been in a wound VAC, but was then admitted to the hospital last week when the wound appeared to be worsening. Apparently the periwound skin was macerated and the device that he had been using was leaking quite a bit. Evaluation while in the hospital included a consultation with infectious disease who did not think he had any evidence of osteomyelitis, plastic surgery who felt that he was not an appropriate flap candidate (their note also states that he is not interested in a flap) and wound care who took him out of the wound VAC and initiated  wet-to-dry dressing changes. He has a new wound VAC from [facility] on order, anticipated delivery today. The wound itself is fairly small and isolated to the sacrum. There is muscle exposed. No bone is appreciated but it is palpable beneath the surface. The muscle itself is  bit pale and there is heaped up epibole around the wound edges. No significant odor or drainage. 01/14/2022: His wound VAC was not initiated until this past Friday. He has not had any issues with the VAC but today we found that the bridge foam was applied directly to the skin rather than over a layer of adhesive drape. His home health nurse also requested that we consider applying silver collagen to the wound bed in addition to the VAC. There is still a little bit of heaped up senescent skin around the wound periphery. No significant change to the wound dimensions. 01/21/2022: No significant change to the wound dimensions. The senescent skin has not reaccumulated. The periwound skin remains a bit macerated but without any obvious breakdown. The wound surface itself has a shiny appearance with a little bit of slough accumulation; no true robust granulation tissue at this time. 01/28/2022: The wound is about the same size but a little bit shallower. There is a little bit of senescent skin reaccumulation at the cranial aspect. The periwound skin is red but not macerated and without any tissue breakdown. The wound still does not have the most robust surface. There is a bit of slough accumulation. 02/04/2022: The wound is a bit smaller and the undermining has come in somewhat. There continues to be granulation tissue formation within the wound bed. No significant slough accumulation and his periwound skin is in better condition. 02/18/2022: The wound stinks today. There is no obvious pus but the drainage and wound itself are malodorous. He continues to have heaped up tissue within the wound bed that is rather grayish and not particularly  robust-appearing. He is very angry today with his situation. 02/25/2022: Last week, in response to the odor coming from the wound, I took a culture and prescribed topical gentamicin as well as oral cefdinir. Apparently [facility] contacted him about a topical compounded antibiotic, but he did not realize this and he hung up on them. Today, the odor is no longer present. There is some senescent skin around the wound margins as well as continued heaped up granulation tissue in the center of the wound. The undermining continues to contract. 03/04/2022: The wound continues to contract and look better. He still has heaped up hypertrophic granulation tissue near the orifice. No odor was coming from the wound today. He is awaiting [place] delivery. 03/18/2022: 2-week follow-up. [facility] topical antibiotic has been initiated. The chemical cauterization of the hypertrophic granulation tissue was quite successful and the surface is much flatter today. He has heaped up senescent skin around the perimeter. His home health providers have figured out a way to keep the wound VAC from losing suction by bolstering with DuoDERM. Overall there has been significant improvement since our last visit. 04/01/2022: 2-week follow-up. The wound continues to contract. Once again, there is heaped up senescent skin around the perimeter. He has a little bit of skin breakdown in the distribution of the adhesive drape from the wound VAC. No odor or purulent drainage. No concern for infection. 04/15/2022: 2-week follow-up. He has developed a fairly substantial rash from the drape adhesive for his wound VAC. The periwound has a lot of heaped up epiboly at the margins. The tissue in the wound bed is a little bit pale but there is no odor or purulent drainage. 04/22/2022: Last week we discontinued the VAC. Today, the skin around his wound is in significantly improved condition. His rash is resolving. He does have some heaped up senescent skin  around the  wound margin and a layer of slough on the wound surface, but there is no concern for active infection. 04/29/2022: The wound continues to contract. The surface is clean. He continues to build up senescent skin around the wound edges that subsequently get a little bit macerated. No concern for infection. 05/06/2022: The wound is smaller again today in all dimensions. The surface is clean. He has his usual accumulation of macerated senescent skin around the wound edges. 05/13/2022: Continued wound contracture. The undermining has decreased quite a bit. Clean surface. Senescent skin heaped up around the wound edges, as per usual. 05/22/2022: The macerated senescent skin has accumulated once again. The wound dimensions are slightly smaller and the surface is clean. He has been having difficulty keeping the packing in the wound due to the large foam border dressing that he prefers. Electronic Signature(s) Signed: 05/22/2022 9:04:26 AM By: [name] MD FACS Entered By: [name] on 05/22/2022 09:04:26 -------------------------------------------------------------------------------- Physical Exam Details Patient Name: Date of Service: [name], [name] 05/22/2022 8:15 A M Medical Record Number: [id number] Patient Account Number: [id number] Date of Birth/Sex: Treating RN: May 31, 1966 (56 y.o. M) Primary Care Provider: MO RRISO [name] Other Clinician: Referring Provider: Treating Provider/Extender: [name] MO RRISO N, RYA N Weeks in Treatment: 36 Constitutional Hypertensive, asymptomatic. . . . No acute distress.Marland Kitchen Respiratory Normal work of breathing on room air.. Notes 05/22/2022: The macerated senescent skin has accumulated once again. The wound dimensions are slightly smaller and the surface is clean. Electronic Signature(s) Signed: 05/22/2022 9:04:58 AM By: [name] MD FACS Entered By: [name] on 05/22/2022  09:04:58 -------------------------------------------------------------------------------- Physician Orders Details Patient Name: Date of Service: [name], [name] 05/22/2022 8:15 A M Medical Record Number: [id number] Patient Account Number: [id number] Date of Birth/Sex: Treating RN: [birth year] (56 y.o. [name] Primary Care Provider: MO Delphina Cahill Other Clinician: Referring Provider: Treating Provider/Extender: [name] MO RRISO Suzette Battiest in Treatment: 70 Verbal / Phone Orders: No Diagnosis Coding ICD-10 Coding Code Description L89.154 Pressure ulcer of sacral region, stage 4 Z89.512 Acquired absence of left leg below knee Z89.511 Acquired absence of right leg below knee Follow-up Appointments ppointment in 1 week. - Dr. Celine Ahr RM 1 with [facility] Basta Return A Wed 9/27 @ 12:30 pm Anesthetic Wound #1 Sacrum (In clinic) Topical Lidocaine 4% applied to wound bed Bathing/ Shower/ Hygiene May shower with protection but do not get wound dressing(s) wet. Off-Loading Gel mattress overlay (Group 1) Turn and reposition every 2 hours Home Health No change in wound care orders this week; continue Home Health for wound care. May utilize formulary equivalent dressing for wound treatment orders unless otherwise specified. Dressing changes to be completed by [place] on Monday / Wednesday / Friday except when patient has scheduled visit at [facility]. Other Home Health Orders/Instructions: - [facility] Wound Treatment Wound #1 - Sacrum Peri-Wound Care: Triamcinolone 15 (g) 3 x Per Week/30 Days Discharge Instructions: Use triamcinolone 15 (g)to rash daily as needed Peri-Wound Care: Zinc Oxide Ointment 30g tube 3 x Per Week/30 Days Discharge Instructions: Apply Zinc Oxide to macerated periwound with each dressing change as needed Topical: [facility] antibiotic compound 3 x Per Week/30 Days Discharge Instructions: in base of wound under gauze Prim Dressing:  Medline Woven Gauze Sponges 4x4 (in/in) 3 x Per Week/30 Days ary Discharge Instructions: pack into wound, moisten with saline and [facility] compound, smaller dressing over wound to hold packing in wouind Secondary Dressing: MPM Excel SAP Bordered  Dressing, 7x6.7 (Sacral) (in/in) 3 x Per Week/30 Days Discharge Instructions: Apply silicone border over primary dressing as directed. Electronic Signature(s) Signed: 05/22/2022 10:03:44 AM By: [name] MD FACS Signed: 05/22/2022 3:57:07 PM By: Baruch Gouty RN, BSN Entered By: Baruch Gouty on 05/22/2022 09:12:17 -------------------------------------------------------------------------------- Problem List Details Patient Name: Date of Service: [name], [name] 05/22/2022 8:15 A M Medical Record Number: [id number] Patient Account Number: [id number] Date of Birth/Sex: Treating RN: 09-Aug-1966 (56 y.o. [name] Primary Care Provider: MO Delphina Cahill Other Clinician: Referring Provider: Treating Provider/Extender: [name] MO RRISO [name] Weeks in Treatment: 19 Active Problems ICD-10 Encounter Code Description Active Date MDM Diagnosis L89.154 Pressure ulcer of sacral region, stage 4 01/08/2022 No Yes Z89.512 Acquired absence of left leg below knee 01/08/2022 No Yes Z89.511 Acquired absence of right leg below knee 01/08/2022 No Yes Inactive Problems Resolved Problems Electronic Signature(s) Signed: 05/22/2022 9:02:45 AM By: [name] MD FACS Entered By: [name] on 05/22/2022 09:02:45 -------------------------------------------------------------------------------- Progress Note Details Patient Name: Date of Service: [name], [name] 05/22/2022 8:15 A M Medical Record Number: [id number] Patient Account Number: [id number] Date of Birth/Sex: Treating RN: 1965/11/29 (56 y.o. M) Primary Care Provider: MO RRISO [name] Other Clinician: Referring Provider: Treating Provider/Extender: [name] MO RRISO [name] Weeks in Treatment: 19 Subjective Chief Complaint Information obtained from Patient Patient is at the clinic for treatment of an open pressure ulcer History of Present Illness (HPI) ADMISSION 01/08/2022 This is a 56 year old male with a past medical history notable for type 2 diabetes mellitus (last A1c was 8.6) congestive heart failure, hypertension, chronic pain, and bilateral below-knee amputations. His most recent amputation was in November 2022. While he was in the hospital, he developed a sacral pressure ulcer. He was subsequently in a skilled nursing facility for some time. He was discharged with home health and had been in a wound VAC, but was then admitted to the hospital last week when the wound appeared to be worsening. Apparently the periwound skin was macerated and the device that he had been using was leaking quite a bit. Evaluation while in the hospital included a consultation with infectious disease who did not think he had any evidence of osteomyelitis, plastic surgery who felt that he was not an appropriate flap candidate (their note also states that he is not interested in a flap) and wound care who took him out of the wound VAC and initiated wet-to-dry dressing changes. He has a new wound VAC from [facility] on order, anticipated delivery today. The wound itself is fairly small and isolated to the sacrum. There is muscle exposed. No bone is appreciated but it is palpable beneath the surface. The muscle itself is bit pale and there is heaped up epibole around the wound edges. No significant odor or drainage. 01/14/2022: His wound VAC was not initiated until this past Friday. He has not had any issues with the VAC but today we found that the bridge foam was applied directly to the skin rather than over a layer of adhesive drape. His home health nurse also requested that we consider applying silver collagen to the wound bed in addition to the VAC. There is still  a little bit of heaped up senescent skin around the wound periphery. No significant change to the wound dimensions. 01/21/2022: No significant change to the wound dimensions. The senescent skin has not reaccumulated. The periwound skin remains a bit macerated but without any obvious breakdown.  The wound surface itself has a shiny appearance with a little bit of slough accumulation; no true robust granulation tissue at this time. 01/28/2022: The wound is about the same size but a little bit shallower. There is a little bit of senescent skin reaccumulation at the cranial aspect. The periwound skin is red but not macerated and without any tissue breakdown. The wound still does not have the most robust surface. There is a bit of slough accumulation. 02/04/2022: The wound is a bit smaller and the undermining has come in somewhat. There continues to be granulation tissue formation within the wound bed. No significant slough accumulation and his periwound skin is in better condition. 02/18/2022: The wound stinks today. There is no obvious pus but the drainage and wound itself are malodorous. He continues to have heaped up tissue within the wound bed that is rather grayish and not particularly robust-appearing. He is very angry today with his situation. 02/25/2022: Last week, in response to the odor coming from the wound, I took a culture and prescribed topical gentamicin as well as oral cefdinir. Apparently [facility] contacted him about a topical compounded antibiotic, but he did not realize this and he hung up on them. Today, the odor is no longer present. There is some senescent skin around the wound margins as well as continued heaped up granulation tissue in the center of the wound. The undermining continues to contract. 03/04/2022: The wound continues to contract and look better. He still has heaped up hypertrophic granulation tissue near the orifice. No odor was coming from the wound today. He is awaiting  [place] delivery. 03/18/2022: 2-week follow-up. [facility] topical antibiotic has been initiated. The chemical cauterization of the hypertrophic granulation tissue was quite successful and the surface is much flatter today. He has heaped up senescent skin around the perimeter. His home health providers have figured out a way to keep the wound VAC from losing suction by bolstering with DuoDERM. Overall there has been significant improvement since our last visit. 04/01/2022: 2-week follow-up. The wound continues to contract. Once again, there is heaped up senescent skin around the perimeter. He has a little bit of skin breakdown in the distribution of the adhesive drape from the wound VAC. No odor or purulent drainage. No concern for infection. 04/15/2022: 2-week follow-up. He has developed a fairly substantial rash from the drape adhesive for his wound VAC. The periwound has a lot of heaped up epiboly at the margins. The tissue in the wound bed is a little bit pale but there is no odor or purulent drainage. 04/22/2022: Last week we discontinued the VAC. Today, the skin around his wound is in significantly improved condition. His rash is resolving. He does have some heaped up senescent skin around the wound margin and a layer of slough on the wound surface, but there is no concern for active infection. 04/29/2022: The wound continues to contract. The surface is clean. He continues to build up senescent skin around the wound edges that subsequently get a little bit macerated. No concern for infection. 05/06/2022: The wound is smaller again today in all dimensions. The surface is clean. He has his usual accumulation of macerated senescent skin around the wound edges. 05/13/2022: Continued wound contracture. The undermining has decreased quite a bit. Clean surface. Senescent skin heaped up around the wound edges, as per usual. 05/22/2022: The macerated senescent skin has accumulated once again. The wound dimensions  are slightly smaller and the surface is clean. He has been having difficulty keeping  the packing in the wound due to the large foam border dressing that he prefers. Patient History Information obtained from Patient, Caregiver, Chart. Family History Cancer - Father, Heart Disease - Father,Paternal Grandparents, Hypertension - Father, No family history of Diabetes, Hereditary Spherocytosis, Kidney Disease, Lung Disease, Seizures, Stroke, Thyroid Problems, Tuberculosis. Social History Never smoker, Marital Status - Divorced, Alcohol Use - Never, Drug Use - No History, Caffeine Use - Moderate - coffee. Medical History Eyes Denies history of Cataracts, Glaucoma, Optic Neuritis Ear/Nose/Mouth/Throat Denies history of Chronic sinus problems/congestion, Middle ear problems Cardiovascular Patient has history of Congestive Heart Failure, Hypertension Endocrine Patient has history of Type II Diabetes Denies history of Type I Diabetes Genitourinary Denies history of End Stage Renal Disease Integumentary (Skin) Denies history of History of Burn Musculoskeletal Patient has history of Osteomyelitis - S1 and coccyx Oncologic Denies history of Received Chemotherapy, Received Radiation Psychiatric Patient has history of Confinement Anxiety Denies history of Anorexia/bulimia Hospitalization/Surgery History - bil BKA. Medical A Surgical History Notes nd Respiratory pulmonary nodules Genitourinary renal mass, urinary hesitancy Musculoskeletal discitis of thoracic region Objective Constitutional Hypertensive, asymptomatic. No acute distress.. Vitals Time Taken: 8:35 AM, Temperature: 98 F, Pulse: 79 bpm, Respiratory Rate: 18 breaths/min, Blood Pressure: 150/92 mmHg, Capillary Blood Glucose: 165 mg/dl. General Notes: glucose per pt report yesterday Respiratory Normal work of breathing on room air.. General Notes: 05/22/2022: The macerated senescent skin has accumulated once again. The wound  dimensions are slightly smaller and the surface is clean. Integumentary (Hair, Skin) Wound #1 status is Open. Original cause of wound was Pressure Injury. The date acquired was: 07/29/2021. The wound has been in treatment 19 weeks. The wound is located on the Sacrum. The wound measures 1.3cm length x 1.7cm width x 1.3cm depth; 1.736cm^2 area and 2.256cm^3 volume. There is Fat Layer (Subcutaneous Tissue) exposed. There is no tunneling noted, however, there is undermining starting at 12:00 and ending at 8:00 with a maximum distance of 1.3cm. There is a medium amount of serosanguineous drainage noted. The wound margin is epibole. There is large (67-100%) red, pink, pale granulation within the wound bed. There is a small (1-33%) amount of necrotic tissue within the wound bed including Adherent Slough. 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 There was a Selective/Open Wound Skin/Epidermis Debridement with a total area of 2.21 sq cm performed by [name], MD. With the following instrument(s): Curette to remove Non-Viable tissue/material. Material removed includes Skin: Epidermis after achieving pain control using Lidocaine 5% topical ointment. No specimens were taken. A time out was conducted at 08:55, prior to the start of the procedure. A Minimum amount of bleeding was controlled with Pressure. The procedure was tolerated well with a pain level of 0 throughout and a pain level of 0 following the procedure. Post Debridement Measurements: 1.3cm length x 1.7cm width x 1.3cm depth; 2.256cm^3 volume. Post debridement Stage noted as Category/Stage IV. Character of Wound/Ulcer Post Debridement is improved. Post procedure Diagnosis Wound #1: Same as Pre-Procedure Plan 05/22/2022: The  macerated senescent skin has accumulated once again. The wound dimensions are slightly smaller and the surface is clean. I used a curette to debride the senescent skin from around the perimeter of the wound orifice. We will continue to use gauze packing moistened with his [facility] topical antibiotic. He will  use a smaller pad to secure the gauze within the wound and then a large foam pad which is his preference. Follow-up in 1 week. Electronic Signature(s) Signed: 05/22/2022 9:05:39 AM By: [name] MD FACS Entered By: [name] on 05/22/2022 09:05:39 -------------------------------------------------------------------------------- HxROS Details Patient Name: Date of Service: [name], [name] 05/22/2022 8:15 A M Medical Record Number: [id number] Patient Account Number: [id number] Date of Birth/Sex: Treating RN: [birth year] (56 y.o. M) Primary Care Provider: [place] RRISO [name] Other Clinician: Referring Provider: Treating Provider/Extender: [name] [place] RRISO [name] Weeks in Treatment: 13 Information Obtained From Patient Caregiver Chart Eyes Medical History: Negative for: Cataracts; Glaucoma; Optic Neuritis Ear/Nose/Mouth/Throat Medical History: Negative for: Chronic sinus problems/congestion; Middle ear problems Respiratory Medical History: Past Medical History Notes: pulmonary nodules Cardiovascular Medical History: Positive for: Congestive Heart Failure; Hypertension Endocrine Medical History: Positive for: Type II Diabetes Negative for: Type I Diabetes Time with diabetes: since 2004 Treated with: Insulin Blood sugar tested every day: Yes Tested : 4 times per day Genitourinary Medical History: Negative for: End Stage Renal Disease Past Medical History Notes: renal mass, urinary hesitancy Integumentary (Skin) Medical History: Negative for: History of Burn Musculoskeletal Medical History: Positive for: Osteomyelitis - S1 and coccyx Past  Medical History Notes: discitis of thoracic region Oncologic Medical History: Negative for: Received Chemotherapy; Received Radiation Psychiatric Medical History: Positive for: Confinement Anxiety Negative for: Anorexia/bulimia Immunizations Pneumococcal Vaccine: Received Pneumococcal Vaccination: No Implantable Devices No devices added Hospitalization / Surgery History Type of Hospitalization/Surgery bil BKA Family and Social History Cancer: Yes - Father; Diabetes: No; Heart Disease: Yes - Father,Paternal Grandparents; Hereditary Spherocytosis: No; Hypertension: Yes - Father; Kidney Disease: No; Lung Disease: No; Seizures: No; Stroke: No; Thyroid Problems: No; Tuberculosis: No; Never smoker; Marital Status - Divorced; Alcohol Use: Never; Drug Use: No History; Caffeine Use: Moderate - coffee; Financial Concerns: No; Food, Clothing or Shelter Needs: No; Support System Lacking: No; Transportation Concerns: Yes - hurts to transfer Electronic Signature(s) Signed: 05/22/2022 10:03:44 AM By: [name] MD FACS Entered By: [name] on 05/22/2022 09:04:31 -------------------------------------------------------------------------------- SuperBill Details Patient Name: Date of Service: [name], [place] 05/22/2022 Medical Record Number: [id number] Patient Account Number: [id number] Date of Birth/Sex: Treating RN: 07-07-1966 (56 y.o. 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 Facility Procedures CPT4 Code: [id number] Description: 7597 - DEBRIDE WOUND 1ST 20 SQ CM OR < ICD-10 Diagnosis Description L89.154 Pressure ulcer of sacral region, stage  4 Modifier: Quantity: 1 Physician Procedures : CPT4 Code Description Modifier [id number] - WC PHYS LEVEL 4 - EST PT ICD-10 Diagnosis Description L89.154 Pressure ulcer of sacral region, stage 4 Quantity: 1 : [id number] - WC PHYS DEBR WO ANESTH 20 SQ CM ICD-10 Diagnosis Description L89.154 Pressure ulcer of sacral region, stage 4 Quantity: 1 Electronic Signature(s) Signed: 05/22/2022 9:06:02 AM By: [name] MD FACS Entered By: [name] on 05/22/2022 09:06:02

## 2022-05-31 DIAGNOSIS — M4628 Osteomyelitis of vertebra, sacral and sacrococcygeal region: Secondary | ICD-10-CM | POA: Diagnosis not present

## 2022-06-01 DIAGNOSIS — N281 Cyst of kidney, acquired: Secondary | ICD-10-CM | POA: Diagnosis not present

## 2022-06-01 DIAGNOSIS — R338 Other retention of urine: Secondary | ICD-10-CM | POA: Diagnosis not present

## 2022-06-02 ENCOUNTER — Other Ambulatory Visit: Payer: Self-pay | Admitting: Surgery

## 2022-06-02 DIAGNOSIS — K429 Umbilical hernia without obstruction or gangrene: Secondary | ICD-10-CM | POA: Diagnosis not present

## 2022-06-02 DIAGNOSIS — K4021 Bilateral inguinal hernia, without obstruction or gangrene, recurrent: Secondary | ICD-10-CM | POA: Diagnosis not present

## 2022-06-03 ENCOUNTER — Encounter (HOSPITAL_BASED_OUTPATIENT_CLINIC_OR_DEPARTMENT_OTHER): Payer: Medicare HMO | Admitting: General Surgery

## 2022-06-03 DIAGNOSIS — I110 Hypertensive heart disease with heart failure: Secondary | ICD-10-CM | POA: Diagnosis not present

## 2022-06-03 DIAGNOSIS — Z89512 Acquired absence of left leg below knee: Secondary | ICD-10-CM | POA: Diagnosis not present

## 2022-06-03 DIAGNOSIS — G8929 Other chronic pain: Secondary | ICD-10-CM | POA: Diagnosis not present

## 2022-06-03 DIAGNOSIS — Z89511 Acquired absence of right leg below knee: Secondary | ICD-10-CM | POA: Diagnosis not present

## 2022-06-03 DIAGNOSIS — L89159 Pressure ulcer of sacral region, unspecified stage: Secondary | ICD-10-CM | POA: Diagnosis not present

## 2022-06-03 DIAGNOSIS — L89154 Pressure ulcer of sacral region, stage 4: Secondary | ICD-10-CM | POA: Diagnosis not present

## 2022-06-03 DIAGNOSIS — Z79899 Other long term (current) drug therapy: Secondary | ICD-10-CM | POA: Diagnosis not present

## 2022-06-03 DIAGNOSIS — J9601 Acute respiratory failure with hypoxia: Secondary | ICD-10-CM | POA: Diagnosis not present

## 2022-06-03 DIAGNOSIS — I509 Heart failure, unspecified: Secondary | ICD-10-CM | POA: Diagnosis not present

## 2022-06-03 DIAGNOSIS — E11622 Type 2 diabetes mellitus with other skin ulcer: Secondary | ICD-10-CM | POA: Diagnosis not present

## 2022-06-03 DIAGNOSIS — E291 Testicular hypofunction: Secondary | ICD-10-CM | POA: Diagnosis not present

## 2022-06-03 DIAGNOSIS — M5136 Other intervertebral disc degeneration, lumbar region: Secondary | ICD-10-CM | POA: Diagnosis not present

## 2022-06-03 DIAGNOSIS — Z5181 Encounter for therapeutic drug level monitoring: Secondary | ICD-10-CM | POA: Diagnosis not present

## 2022-06-04 ENCOUNTER — Telehealth: Payer: Self-pay | Admitting: Internal Medicine

## 2022-06-04 NOTE — Progress Notes (Signed)
[name], [name] ([id number]) Visit Report for 06/03/2022 Arrival Information Details Patient Name: Date of Service: [name], [name] 06/03/2022 12:30 PM Medical Record Number: [id number] Patient Account Number: [id number] Date of Birth/Sex: Treating RN: October 12, 1965 (56 y.o. [name] Primary Care Cleopha Indelicato: [place] RRISO [name] Other Clinician: Referring Isebella Upshur: Treating [name]/Extender: [name] [place] RRISO [name] Weeks in Treatment: 20 Visit Information History Since Last Visit Added or deleted any medications: No Patient Arrived: Wheel Chair Any new allergies or adverse reactions: No Arrival Time: 12:32 Had a fall or experienced change in No Accompanied By: wife activities of daily living that may affect Transfer Assistance: None risk of falls: Patient Requires Transmission-Based Precautions: No Signs or symptoms of abuse/neglect since last visito No Patient Has Alerts: No Hospitalized since last visit: No Implantable device outside of the clinic excluding No cellular tissue based products placed in the [name] since last visit: Has Dressing in Place as Prescribed: Yes Pain Present Now: No Electronic Signature(s) Signed: 06/03/2022 2:21:05 PM By: Sharyn Creamer RN, BSN Entered By: Sharyn Creamer on 06/03/2022 12:33:57 -------------------------------------------------------------------------------- Encounter Discharge Information Details Patient Name: Date of Service: [name], [name] 06/03/2022 12:30 PM Medical Record Number: [id number] Patient Account Number: [id number] Date of Birth/Sex: Treating RN: Jan 17, 1966 (56 y.o. [name] Primary Care Berniece Abid: [place] RRISO [name] Other Clinician: Referring Rovena Hearld: Treating [name]/Extender: [name] [place] RRISO [name] Weeks in Treatment: 20 Encounter Discharge Information Items Post Procedure Vitals Discharge Condition: Stable Temperature (F): 98.5 Ambulatory Status: Wheelchair Pulse (bpm):  79 Discharge Destination: Home Respiratory Rate (breaths/min): 18 Transportation: Private Auto Blood Pressure (mmHg): 166/96 Accompanied By: wife Schedule Follow-up Appointment: Yes Clinical Summary of Care: Patient Declined Electronic Signature(s) Signed: 06/03/2022 2:21:05 PM By: Sharyn Creamer RN, BSN Entered By: Sharyn Creamer on 06/03/2022 13:19:19 -------------------------------------------------------------------------------- Lower Extremity Assessment Details Patient Name: Date of Service: [name], [name] 06/03/2022 12:30 PM Medical Record Number: [id number] Patient Account Number: [id number] Date of Birth/Sex: Treating RN: 1966/05/20 (56 y.o. [name] Primary Care Avy Barlett: [place] Delphina Cahill Other Clinician: Referring [name]: Treating [name]/Extender: [name] [place] RRISO [name] Weeks in Treatment: 20 Electronic Signature(s) Signed: 06/03/2022 2:21:05 PM By: Sharyn Creamer RN, BSN Entered By: Sharyn Creamer on 06/03/2022 12:39:10 -------------------------------------------------------------------------------- Multi Wound Chart Details Patient Name: Date of Service: [name], [name] 06/03/2022 12:30 PM Medical Record Number: [id number] Patient Account Number: [id number] Date of Birth/Sex: Treating RN: 16-Feb-1966 (56 y.o. [name] Primary Care Kendy Haston: [place] Delphina Cahill Other Clinician: Referring Jacalyn Biggs: Treating [name]/Extender: [name] [place] RRISO [name] Weeks in Treatment: 20 Vital Signs Height(in): Capillary Blood Glucose(mg/dl): 165 Weight(lbs): Pulse(bpm): 72 Body Mass Index(BMI): Blood Pressure(mmHg): 166/96 Temperature(F): 98.5 Respiratory Rate(breaths/min): 18 Photos: [N/A:N/A] Sacrum N/A N/A Wound Location: Pressure Injury N/A N/A Wounding Event: Pressure Ulcer N/A N/A Primary Etiology: Congestive Heart Failure, N/A N/A Comorbid History: Hypertension, Type II Diabetes, Osteomyelitis, Confinement  Anxiety 07/29/2021 N/A N/A Date Acquired: 20 N/A N/A Weeks of Treatment: Open N/A N/A Wound Status: No N/A N/A Wound Recurrence: 1.2x1.2x0.9 N/A N/A Measurements L x W x D (cm) 1.131 N/A N/A A (cm) : rea 1.018 N/A N/A Volume (cm) : 80.80% N/A N/A % Reduction in A rea: 84.30% N/A N/A % Reduction in Volume: 12 Starting Position 1 (o'clock): 6 Ending Position 1 (o'clock): 0.9 Maximum Distance 1 (cm): Yes N/A N/A Undermining: Category/Stage IV N/A N/A Classification: Medium N/A N/A Exudate A mount: Serosanguineous N/A N/A  Exudate Type: red, brown N/A N/A Exudate Color: Epibole N/A N/A Wound Margin: Large (67-100%) N/A N/A Granulation Amount: Red, Pink, Pale, Friable N/A N/A Granulation Quality: Small (1-33%) N/A N/A Necrotic Amount: Fat Layer (Subcutaneous Tissue): Yes N/A N/A Exposed Structures: Fascia: No Tendon: No Muscle: No Joint: No Bone: No Large (67-100%) N/A N/A Epithelialization: Debridement - Selective/Open Wound N/A N/A Debridement: Pre-procedure Verification/Time Out 12:50 N/A N/A Taken: Lidocaine 5% topical ointment N/A N/A Pain Control: Slough N/A N/A Tissue Debrided: Skin/Epidermis N/A N/A Level: 1.44 N/A N/A Debridement A (sq cm): rea Curette N/A N/A Instrument: Minimum N/A N/A Bleeding: Pressure N/A N/A Hemostasis A chieved: 0 N/A N/A Procedural Pain: 0 N/A N/A Post Procedural Pain: Procedure was tolerated well N/A N/A Debridement Treatment Response: 1.2x1.2x0.9 N/A N/A Post Debridement Measurements L x W x D (cm) 1.018 N/A N/A Post Debridement Volume: (cm) Category/Stage IV N/A N/A Post Debridement Stage: Debridement N/A N/A Procedures Performed: Treatment Notes Electronic Signature(s) Signed: 06/03/2022 12:58:13 PM By: [name] MD FACS Signed: 06/04/2022 5:24:26 PM By: Baruch Gouty RN, BSN Entered By: [name] on 06/03/2022  12:58:12 -------------------------------------------------------------------------------- Multi-Disciplinary Care Plan Details Patient Name: Date of Service: [name], [name] 06/03/2022 12:30 PM Medical Record Number: [id number] Patient Account Number: [id number] Date of Birth/Sex: Treating RN: 08-Nov-1965 (56 y.o. [name] Primary Care Eldana Isip: [place] RRISO [name] Other Clinician: Referring Jerrico Covello: Treating Talvin Christianson/Extender: [name] [place] RRISO Suzette Battiest in Treatment: [place] reviewed with physician Active Inactive Nutrition Nursing Diagnoses: Impaired glucose control: actual or potential Potential for alteratiion in Nutrition/Potential for imbalanced nutrition Goals: Patient/caregiver will maintain therapeutic glucose control Date Initiated: 01/08/2022 Target Resolution Date: 07/02/2022 Goal Status: Active Interventions: Assess HgA1c results as ordered upon admission and as needed Assess patient nutrition upon admission and as needed per policy Provide education on elevated blood sugars and impact on wound healing Treatment Activities: Dietary management education, guidance and counseling : 01/08/2022 Patient referred to Primary Care Physician for further nutritional evaluation : 01/08/2022 Notes: Pressure Nursing Diagnoses: Knowledge deficit related to causes and risk factors for pressure ulcer development Knowledge deficit related to management of pressures ulcers Potential for impaired tissue integrity related to pressure, friction, moisture, and shear Goals: Patient/caregiver will verbalize understanding of pressure ulcer management Date Initiated: 01/08/2022 Target Resolution Date: 07/02/2022 Goal Status: Active Interventions: Assess: immobility, friction, shearing, incontinence upon admission and as needed Assess offloading mechanisms upon admission and as needed Assess potential for pressure ulcer upon admission and as  needed Notes: Wound/Skin Impairment Nursing Diagnoses: Impaired tissue integrity Knowledge deficit related to ulceration/compromised skin integrity Goals: Patient/caregiver will verbalize understanding of skin care regimen Date Initiated: 01/08/2022 Target Resolution Date: 07/02/2022 Goal Status: Active Ulcer/skin breakdown will have a volume reduction of 30% by week 4 Date Initiated: 01/08/2022 Date Inactivated: 02/18/2022 Target Resolution Date: 02/05/2022 Goal Status: Unmet Unmet Reason: VAC leaking Ulcer/skin breakdown will have a volume reduction of 50% by week 8 Date Initiated: 02/18/2022 Date Inactivated: 03/04/2022 Target Resolution Date: 03/05/2022 Goal Status: Unmet Unmet Reason: infection Ulcer/skin breakdown will have a volume reduction of 80% by week 12 Date Initiated: 03/04/2022 Date Inactivated: 04/01/2022 Target Resolution Date: 04/02/2022 Goal Status: Unmet Unmet Reason: too much moisture Interventions: Assess patient/caregiver ability to obtain necessary supplies Assess patient/caregiver ability to perform ulcer/skin care regimen upon admission and as needed Assess ulceration(s) every visit Provide education on ulcer and skin care Treatment Activities: Skin care regimen initiated : 01/08/2022 Topical wound management initiated : 01/08/2022  Notes: Electronic Signature(s) Signed: 06/03/2022 2:21:05 PM By: Sharyn Creamer RN, BSN Entered By: Sharyn Creamer on 06/03/2022 12:51:09 -------------------------------------------------------------------------------- Pain Assessment Details Patient Name: Date of Service: [name], [name] 06/03/2022 12:30 PM Medical Record Number: [id number] Patient Account Number: [id number] Date of Birth/Sex: Treating RN: [birth year] (56 y.o. [name] Primary Care Evon Lopezperez: [place] RRISO [name] Other Clinician: Referring Victoria Henshaw: Treating Dhara Schepp/Extender: [name] [place] RRISO [name] Weeks in Treatment: 20 Active  Problems Location of Pain Severity and Description of Pain Patient Has Paino No Site Locations Pain Management and Medication Current Pain Management: Electronic Signature(s) Signed: 06/03/2022 2:21:05 PM By: Sharyn Creamer RN, BSN Entered By: Sharyn Creamer on 06/03/2022 12:39:02 -------------------------------------------------------------------------------- Patient/Caregiver Education Details Patient Name: Date of Service: [name], [name] 9/27/2023andnbsp12:30 PM Medical Record Number: [id number] Patient Account Number: [id number] Date of Birth/Gender: Treating RN: 06-05-1966 (56 y.o. [name] Primary Care Physician: [place] Delphina Cahill Other Clinician: Referring Physician: Treating Physician/Extender: [name] [place] RRISO Suzette Battiest in Treatment: 20 Education Assessment Education Provided To: Patient Education Topics Provided Wound/Skin Impairment: Methods: Explain/Verbal Responses: State content correctly [employer]) Signed: 06/03/2022 2:21:05 PM By: Sharyn Creamer RN, BSN Entered By: Sharyn Creamer on 06/03/2022 12:55:43 -------------------------------------------------------------------------------- Wound Assessment Details Patient Name: Date of Service: [name], [name] 06/03/2022 12:30 PM Medical Record Number: [id number] Patient Account Number: [id number] Date of Birth/Sex: Treating RN: 11-01-1965 (56 y.o. [name] Primary Care Gwin Eagon: [place] RRISO [name] Other Clinician: Referring September Mormile: Treating Meshach Perry/Extender: [name] [place] RRISO [name] Weeks in Treatment: 20 Wound Status Wound Number: 1 Primary Pressure Ulcer Etiology: Wound Location: Sacrum Wound Open Wounding Event: Pressure Injury Status: Date Acquired: 07/29/2021 Comorbid Congestive Heart Failure, Hypertension, Type II Diabetes, Weeks Of Treatment: 20 History: Osteomyelitis, Confinement Anxiety Clustered Wound: No Photos Wound  Measurements Length: (cm) 1.2 Width: (cm) 1.2 Depth: (cm) 0.9 Area: (cm) 1.131 Volume: (cm) 1.018 % Reduction in Area: 80.8% % Reduction in Volume: 84.3% Epithelialization: Large (67-100%) Tunneling: No Undermining: Yes Starting Position (o'clock): 12 Ending Position (o'clock): 6 Maximum Distance: (cm) 0.9 Wound Description Classification: Category/Stage IV Wound Margin: Epibole Exudate Amount: Medium Exudate Type: Serosanguineous Exudate Color: red, brown Foul Odor After Cleansing: No Slough/Fibrino Yes Wound Bed Granulation Amount: Large (67-100%) Exposed Structure Granulation Quality: Red, Pink, Pale, Friable Fascia Exposed: No Necrotic Amount: Small (1-33%) Fat Layer (Subcutaneous Tissue) Exposed: Yes Necrotic Quality: Adherent Slough Tendon Exposed: No Muscle Exposed: No Joint Exposed: No Bone Exposed: No Treatment Notes Wound #1 (Sacrum) Cleanser Peri-Wound Care Triamcinolone 15 (g) Discharge Instruction: Use triamcinolone 15 (g)to rash daily as needed Zinc Oxide Ointment 30g tube Discharge Instruction: Apply Zinc Oxide to macerated periwound with each dressing change as needed Topical Keystone antibiotic compound Discharge Instruction: in base of wound under gauze Primary Dressing Medline Woven Gauze Sponges 4x4 (in/in) Discharge Instruction: pack into wound, moisten with saline and Keystone compound, smaller dressing over wound to hold packing in wouind Secondary Dressing MPM Excel SAP Bordered Dressing, 7x6.7 (Sacral) (in/in) Discharge Instruction: Apply silicone border over primary dressing as directed. Secured With Compression Wrap Compression Stockings Environmental education officer) Signed: 06/03/2022 2:21:05 PM By: Sharyn Creamer RN, BSN Entered By: Sharyn Creamer on 06/03/2022 12:46:30 -------------------------------------------------------------------------------- [place] Details Patient Name: Date of Service: [name], [name] 06/03/2022  12:30 PM Medical Record Number: [id number] Patient Account Number: [id number] Date of Birth/Sex: Treating RN: 01-29-1966 (56 y.o. [name] Primary Care Lisabeth Mian: [place] RRISO [name]  Other Clinician: Referring Mende Biswell: Treating Tajuanna Burnett/Extender: [name] [place] RRISO N, RYA N Weeks in Treatment: 20 Vital Signs Time Taken: 12:38 Temperature (F): 98.5 Pulse (bpm): 79 Respiratory Rate (breaths/min): 18 Blood Pressure (mmHg): 166/96 Capillary Blood Glucose (mg/dl): 165 Reference Range: 80 - 120 mg / dl Electronic Signature(s) Signed: 06/03/2022 2:21:05 PM By: Sharyn Creamer RN, BSN Entered By: Sharyn Creamer on 06/03/2022 12:38:53

## 2022-06-04 NOTE — Progress Notes (Signed)
HYLAND, MOLLENKOPF ([id number]) Visit Report for 06/03/2022 Chief Complaint Document Details Patient Name: Date of Service: [name], [name] 06/03/2022 12:30 PM Medical Record Number: [id number] Patient Account Number: [id number] Date of Birth/Sex: Treating RN: 11/27/1965 (56 y.o. [name] Primary Care Provider: MO Delphina Cahill Other Clinician: Referring Provider: Treating Provider/Extender: [name] MO RRISO [name] Weeks in Treatment: 20 Information Obtained from: Patient Chief Complaint Patient is at the clinic for treatment of an open pressure ulcer Electronic Signature(s) Signed: 06/03/2022 12:58:18 PM By: [name] MD FACS Entered By: [name] on 06/03/2022 12:58:18 -------------------------------------------------------------------------------- Debridement Details Patient Name: Date of Service: [name], [name] 06/03/2022 12:30 PM Medical Record Number: [id number] Patient Account Number: [id number] Date of Birth/Sex: Treating RN: Apr 12, 1966 (56 y.o. [name] Primary Care Provider: MO RRISO [name] Other Clinician: Referring Provider: Treating Provider/Extender: [name] MO RRISO [name] Weeks in Treatment: 20 Debridement Performed for Assessment: Wound #1 Sacrum Performed By: Physician [name], MD Debridement Type: Debridement Level of Consciousness (Pre-procedure): Awake and Alert Pre-procedure Verification/Time Out Yes - 12:50 Taken: Start Time: 12:52 Pain Control: Lidocaine 5% topical ointment T Area Debrided (L x W): otal 1.2 (cm) x 1.2 (cm) = 1.44 (cm) Tissue and other material debrided: Non-Viable, Slough, Skin: Dermis , Skin: Epidermis, Slough Level: Skin/Epidermis Debridement Description: Selective/Open Wound Instrument: Curette Bleeding: Minimum Hemostasis Achieved: Pressure Procedural Pain: 0 Post Procedural Pain: 0 Response to Treatment: Procedure was tolerated well Level of Consciousness  (Post- Awake and Alert procedure): Post Debridement Measurements of Total Wound Length: (cm) 1.2 Stage: Category/Stage IV Width: (cm) 1.2 Depth: (cm) 0.9 Volume: (cm) 1.018 Character of Wound/Ulcer Post Debridement: Improved Post Procedure Diagnosis Same as Pre-procedure Electronic Signature(s) Signed: 06/03/2022 1:02:06 PM By: [name] MD FACS Signed: 06/03/2022 2:21:05 PM By: Sharyn Creamer RN, BSN Entered By: Sharyn Creamer on 06/03/2022 12:53:41 -------------------------------------------------------------------------------- HPI Details Patient Name: Date of Service: [name], [name] 06/03/2022 12:30 PM Medical Record Number: [id number] Patient Account Number: [id number] Date of Birth/Sex: Treating RN: July 21, 1966 (56 y.o. [name] Primary Care Provider: MO Delphina Cahill Other Clinician: Referring Provider: Treating Provider/Extender: [name] MO RRISO [name] Weeks in Treatment: 20 History of Present Illness HPI Description: ADMISSION 01/08/2022 This is a 56 year old male with a past medical history notable for type 2 diabetes mellitus (last A1c was 8.6) congestive heart failure, hypertension, chronic pain, and bilateral below-knee amputations. His most recent amputation was in November 2022. While he was in the hospital, he developed a sacral pressure ulcer. He was subsequently in a skilled nursing facility for some time. He was discharged with home health and had been in a wound VAC, but was then admitted to the hospital last week when the wound appeared to be worsening. Apparently the periwound skin was macerated and the device that he had been using was leaking quite a bit. Evaluation while in the hospital included a consultation with infectious disease who did not think he had any evidence of osteomyelitis, plastic surgery who felt that he was not an appropriate flap candidate (their note also states that he is not interested in a flap) and wound  care who took him out of the wound VAC and initiated wet-to-dry dressing changes. He has a new wound VAC from [facility] on order, anticipated delivery today. The wound itself is fairly small and isolated to the sacrum. There is muscle exposed. No bone is appreciated but it is palpable beneath  the surface. The muscle itself is bit pale and there is heaped up epibole around the wound edges. No significant odor or drainage. 01/14/2022: His wound VAC was not initiated until this past Friday. He has not had any issues with the VAC but today we found that the bridge foam was applied directly to the skin rather than over a layer of adhesive drape. His home health nurse also requested that we consider applying silver collagen to the wound bed in addition to the VAC. There is still a little bit of heaped up senescent skin around the wound periphery. No significant change to the wound dimensions. 01/21/2022: No significant change to the wound dimensions. The senescent skin has not reaccumulated. The periwound skin remains a bit macerated but without any obvious breakdown. The wound surface itself has a shiny appearance with a little bit of slough accumulation; no true robust granulation tissue at this time. 01/28/2022: The wound is about the same size but a little bit shallower. There is a little bit of senescent skin reaccumulation at the cranial aspect. The periwound skin is red but not macerated and without any tissue breakdown. The wound still does not have the most robust surface. There is a bit of slough accumulation. 02/04/2022: The wound is a bit smaller and the undermining has come in somewhat. There continues to be granulation tissue formation within the wound bed. No significant slough accumulation and his periwound skin is in better condition. 02/18/2022: The wound stinks today. There is no obvious pus but the drainage and wound itself are malodorous. He continues to have heaped up tissue within the wound bed  that is rather grayish and not particularly robust-appearing. He is very angry today with his situation. 02/25/2022: Last week, in response to the odor coming from the wound, I took a culture and prescribed topical gentamicin as well as oral cefdinir. Apparently [facility] contacted him about a topical compounded antibiotic, but he did not realize this and he hung up on them. Today, the odor is no longer present. There is some senescent skin around the wound margins as well as continued heaped up granulation tissue in the center of the wound. The undermining continues to contract. 03/04/2022: The wound continues to contract and look better. He still has heaped up hypertrophic granulation tissue near the orifice. No odor was coming from the wound today. He is awaiting [place] delivery. 03/18/2022: 2-week follow-up. [facility] topical antibiotic has been initiated. The chemical cauterization of the hypertrophic granulation tissue was quite successful and the surface is much flatter today. He has heaped up senescent skin around the perimeter. His home health providers have figured out a way to keep the wound VAC from losing suction by bolstering with DuoDERM. Overall there has been significant improvement since our last visit. 04/01/2022: 2-week follow-up. The wound continues to contract. Once again, there is heaped up senescent skin around the perimeter. He has a little bit of skin breakdown in the distribution of the adhesive drape from the wound VAC. No odor or purulent drainage. No concern for infection. 04/15/2022: 2-week follow-up. He has developed a fairly substantial rash from the drape adhesive for his wound VAC. The periwound has a lot of heaped up epiboly at the margins. The tissue in the wound bed is a little bit pale but there is no odor or purulent drainage. 04/22/2022: Last week we discontinued the VAC. Today, the skin around his wound is in significantly improved condition. His rash is resolving. He  does have some  heaped up senescent skin around the wound margin and a layer of slough on the wound surface, but there is no concern for active infection. 04/29/2022: The wound continues to contract. The surface is clean. He continues to build up senescent skin around the wound edges that subsequently get a little bit macerated. No concern for infection. 05/06/2022: The wound is smaller again today in all dimensions. The surface is clean. He has his usual accumulation of macerated senescent skin around the wound edges. 05/13/2022: Continued wound contracture. The undermining has decreased quite a bit. Clean surface. Senescent skin heaped up around the wound edges, as per usual. 05/22/2022: The macerated senescent skin has accumulated once again. The wound dimensions are slightly smaller and the surface is clean. He has been having difficulty keeping the packing in the wound due to the large foam border dressing that he prefers. 06/03/2022: The wound is smaller and shallower with less undermining. He has built up macerated senescent skin around the margins, as per usual. He and his friend have figured out a way to make sure that his packing stays in the wound with his [place]. Electronic Signature(s) Signed: 06/03/2022 12:59:08 PM By: [name] MD FACS Entered By: [name] on 06/03/2022 12:59:08 -------------------------------------------------------------------------------- Physical Exam Details Patient Name: Date of Service: [name], [name] 06/03/2022 12:30 PM Medical Record Number: [id number] Patient Account Number: [id number] Date of Birth/Sex: Treating RN: 02-07-1966 (56 y.o. [name] Primary Care Provider: MO Delphina Cahill Other Clinician: Referring Provider: Treating Provider/Extender: [name] MO RRISO N, RYA N Weeks in Treatment: 20 Constitutional Hypertensive, asymptomatic. . . . No acute distress.Marland Kitchen Respiratory Normal work of breathing on room  air.. Notes 06/03/2022: The wound is smaller and shallower with less undermining. He has built up macerated senescent skin around the margins, as per usual. Electronic Signature(s) Signed: 06/03/2022 1:00:07 PM By: [name] MD FACS Entered By: [name] on 06/03/2022 13:00:06 -------------------------------------------------------------------------------- Physician Orders Details Patient Name: Date of Service: [name], [name] 06/03/2022 12:30 PM Medical Record Number: [id number] Patient Account Number: [id number] Date of Birth/Sex: Treating RN: 1966/07/11 (56 y.o. [name] Primary Care Provider: MO RRISO [name] Other Clinician: Referring Provider: Treating Provider/Extender: [name] MO RRISO [name] Weeks in Treatment: 20 Verbal / Phone Orders: No Diagnosis Coding ICD-10 Coding Code Description L89.154 Pressure ulcer of sacral region, stage 4 Z89.512 Acquired absence of left leg below knee Z89.511 Acquired absence of right leg below knee Follow-up Appointments ppointment in 1 week. - Dr. Celine Ahr RM 1 with [facility] Return A Anesthetic Wound #1 Sacrum (In clinic) Topical Lidocaine 4% applied to wound bed Bathing/ Shower/ Hygiene May shower with protection but do not get wound dressing(s) wet. Off-Loading Gel mattress overlay (Group 1) Turn and reposition every 2 hours Home Health No change in wound care orders this week; continue Home Health for wound care. May utilize formulary equivalent dressing for wound treatment orders unless otherwise specified. Dressing changes to be completed by [place] on Monday / Wednesday / Friday except when patient has scheduled visit at [facility]. Other Home Health Orders/Instructions: - [facility] Wound Treatment Wound #1 - Sacrum Peri-Wound Care: Triamcinolone 15 (g) 3 x Per Week/30 Days Discharge Instructions: Use triamcinolone 15 (g)to rash daily as needed Peri-Wound Care: Zinc Oxide Ointment 30g  tube 3 x Per Week/30 Days Discharge Instructions: Apply Zinc Oxide to macerated periwound with each dressing change as needed Topical: [facility] antibiotic compound 3 x Per Week/30  Days Discharge Instructions: in base of wound under gauze Prim Dressing: Medline Woven Gauze Sponges 4x4 (in/in) 3 x Per Week/30 Days ary Discharge Instructions: pack into wound, moisten with saline and [facility] compound, smaller dressing over wound to hold packing in wouind Secondary Dressing: MPM Excel SAP Bordered Dressing, 7x6.7 (Sacral) (in/in) 3 x Per Week/30 Days Discharge Instructions: Apply silicone border over primary dressing as directed. Patient Medications llergies: Bactrim, Ceprotin ([place]), ciprofloxacin, Levaquin A Notifications Medication Indication Start End 06/03/2022 lidocaine DOSE topical 5 % ointment - ointment topical Electronic Signature(s) Signed: 06/03/2022 1:02:06 PM By: [name] MD FACS Entered By: [name] on 06/03/2022 13:00:19 -------------------------------------------------------------------------------- Problem List Details Patient Name: Date of Service: [name], [name] 06/03/2022 12:30 PM Medical Record Number: [id number] Patient Account Number: [id number] Date of Birth/Sex: Treating RN: 1966/01/25 (55 y.o. [name] Primary Care Provider: MO Delphina Cahill Other Clinician: Referring Provider: Treating Provider/Extender: [name] MO RRISO [name] Weeks in Treatment: 20 Active Problems ICD-10 Encounter Code Description Active Date MDM Diagnosis L89.154 Pressure ulcer of sacral region, stage 4 01/08/2022 No Yes Z89.512 Acquired absence of left leg below knee 01/08/2022 No Yes Z89.511 Acquired absence of right leg below knee 01/08/2022 No Yes Inactive Problems Resolved Problems Electronic Signature(s) Signed: 06/03/2022 12:56:46 PM By: [name] MD FACS Entered By: [name] on 06/03/2022  12:56:46 -------------------------------------------------------------------------------- Progress Note Details Patient Name: Date of Service: [name], [name] 06/03/2022 12:30 PM Medical Record Number: [id number] Patient Account Number: [id number] Date of Birth/Sex: Treating RN: 1966-03-31 (56 y.o. [name] Primary Care Provider: MO Delphina Cahill Other Clinician: Referring Provider: Treating Provider/Extender: [name] MO RRISO [name] Weeks in Treatment: 20 Subjective Chief Complaint Information obtained from Patient Patient is at the clinic for treatment of an open pressure ulcer History of Present Illness (HPI) ADMISSION 01/08/2022 This is a 56 year old male with a past medical history notable for type 2 diabetes mellitus (last A1c was 8.6) congestive heart failure, hypertension, chronic pain, and bilateral below-knee amputations. His most recent amputation was in November 2022. While he was in the hospital, he developed a sacral pressure ulcer. He was subsequently in a skilled nursing facility for some time. He was discharged with home health and had been in a wound VAC, but was then admitted to the hospital last week when the wound appeared to be worsening. Apparently the periwound skin was macerated and the device that he had been using was leaking quite a bit. Evaluation while in the hospital included a consultation with infectious disease who did not think he had any evidence of osteomyelitis, plastic surgery who felt that he was not an appropriate flap candidate (their note also states that he is not interested in a flap) and wound care who took him out of the wound VAC and initiated wet-to-dry dressing changes. He has a new wound VAC from [facility] on order, anticipated delivery today. The wound itself is fairly small and isolated to the sacrum. There is muscle exposed. No bone is appreciated but it is palpable beneath the surface. The muscle itself is bit pale and  there is heaped up epibole around the wound edges. No significant odor or drainage. 01/14/2022: His wound VAC was not initiated until this past Friday. He has not had any issues with the VAC but today we found that the bridge foam was applied directly to the skin rather than over a layer of adhesive drape. His home health nurse  also requested that we consider applying silver collagen to the wound bed in addition to the VAC. There is still a little bit of heaped up senescent skin around the wound periphery. No significant change to the wound dimensions. 01/21/2022: No significant change to the wound dimensions. The senescent skin has not reaccumulated. The periwound skin remains a bit macerated but without any obvious breakdown. The wound surface itself has a shiny appearance with a little bit of slough accumulation; no true robust granulation tissue at this time. 01/28/2022: The wound is about the same size but a little bit shallower. There is a little bit of senescent skin reaccumulation at the cranial aspect. The periwound skin is red but not macerated and without any tissue breakdown. The wound still does not have the most robust surface. There is a bit of slough accumulation. 02/04/2022: The wound is a bit smaller and the undermining has come in somewhat. There continues to be granulation tissue formation within the wound bed. No significant slough accumulation and his periwound skin is in better condition. 02/18/2022: The wound stinks today. There is no obvious pus but the drainage and wound itself are malodorous. He continues to have heaped up tissue within the wound bed that is rather grayish and not particularly robust-appearing. He is very angry today with his situation. 02/25/2022: Last week, in response to the odor coming from the wound, I took a culture and prescribed topical gentamicin as well as oral cefdinir. Apparently [facility] contacted him about a topical compounded antibiotic, but he did  not realize this and he hung up on them. Today, the odor is no longer present. There is some senescent skin around the wound margins as well as continued heaped up granulation tissue in the center of the wound. The undermining continues to contract. 03/04/2022: The wound continues to contract and look better. He still has heaped up hypertrophic granulation tissue near the orifice. No odor was coming from the wound today. He is awaiting [place] delivery. 03/18/2022: 2-week follow-up. [facility] topical antibiotic has been initiated. The chemical cauterization of the hypertrophic granulation tissue was quite successful and the surface is much flatter today. He has heaped up senescent skin around the perimeter. His home health providers have figured out a way to keep the wound VAC from losing suction by bolstering with DuoDERM. Overall there has been significant improvement since our last visit. 04/01/2022: 2-week follow-up. The wound continues to contract. Once again, there is heaped up senescent skin around the perimeter. He has a little bit of skin breakdown in the distribution of the adhesive drape from the wound VAC. No odor or purulent drainage. No concern for infection. 04/15/2022: 2-week follow-up. He has developed a fairly substantial rash from the drape adhesive for his wound VAC. The periwound has a lot of heaped up epiboly at the margins. The tissue in the wound bed is a little bit pale but there is no odor or purulent drainage. 04/22/2022: Last week we discontinued the VAC. Today, the skin around his wound is in significantly improved condition. His rash is resolving. He does have some heaped up senescent skin around the wound margin and a layer of slough on the wound surface, but there is no concern for active infection. 04/29/2022: The wound continues to contract. The surface is clean. He continues to build up senescent skin around the wound edges that subsequently get a little bit macerated. No  concern for infection. 05/06/2022: The wound is smaller again today in all dimensions. The surface  is clean. He has his usual accumulation of macerated senescent skin around the wound edges. 05/13/2022: Continued wound contracture. The undermining has decreased quite a bit. Clean surface. Senescent skin heaped up around the wound edges, as per usual. 05/22/2022: The macerated senescent skin has accumulated once again. The wound dimensions are slightly smaller and the surface is clean. He has been having difficulty keeping the packing in the wound due to the large foam border dressing that he prefers. 06/03/2022: The wound is smaller and shallower with less undermining. He has built up macerated senescent skin around the margins, as per usual. He and his friend have figured out a way to make sure that his packing stays in the wound with his [place]. Patient History Information obtained from Patient, Caregiver, Chart. Family History Cancer - Father, Heart Disease - Father,Paternal Grandparents, Hypertension - Father, No family history of Diabetes, Hereditary Spherocytosis, Kidney Disease, Lung Disease, Seizures, Stroke, Thyroid Problems, Tuberculosis. Social History Never smoker, Marital Status - Divorced, Alcohol Use - Never, Drug Use - No History, Caffeine Use - Moderate - coffee. Medical History Eyes Denies history of Cataracts, Glaucoma, Optic Neuritis Ear/Nose/Mouth/Throat Denies history of Chronic sinus problems/congestion, Middle ear problems Cardiovascular Patient has history of Congestive Heart Failure, Hypertension Endocrine Patient has history of Type II Diabetes Denies history of Type I Diabetes Genitourinary Denies history of End Stage Renal Disease Integumentary (Skin) Denies history of History of Burn Musculoskeletal Patient has history of Osteomyelitis - S1 and coccyx Oncologic Denies history of Received Chemotherapy, Received Radiation Psychiatric Patient has history of  Confinement Anxiety Denies history of Anorexia/bulimia Hospitalization/Surgery History - bil BKA. Medical A Surgical History Notes nd Respiratory pulmonary nodules Genitourinary renal mass, urinary hesitancy Musculoskeletal discitis of thoracic region Objective Constitutional Hypertensive, asymptomatic. No acute distress.. Vitals Time Taken: 12:38 PM, Temperature: 98.5 F, Pulse: 79 bpm, Respiratory Rate: 18 breaths/min, Blood Pressure: 166/96 mmHg, Capillary Blood Glucose: 165 mg/dl. Respiratory Normal work of breathing on room air.. General Notes: 06/03/2022: The wound is smaller and shallower with less undermining. He has built up macerated senescent skin around the margins, as per usual. Integumentary (Hair, Skin) Wound #1 status is Open. Original cause of wound was Pressure Injury. The date acquired was: 07/29/2021. The wound has been in treatment 20 weeks. The wound is located on the Sacrum. The wound measures 1.2cm length x 1.2cm width x 0.9cm depth; 1.131cm^2 area and 1.018cm^3 volume. There is Fat Layer (Subcutaneous Tissue) exposed. There is no tunneling noted, however, there is undermining starting at 12:00 and ending at 6:00 with a maximum distance of 0.9cm. There is a medium amount of serosanguineous drainage noted. The wound margin is epibole. There is large (67-100%) red, pink, pale, friable granulation within the wound bed. There is a small (1-33%) amount of necrotic tissue within the wound bed including Adherent Slough. 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 There was a Selective/Open Wound Skin/Epidermis Debridement with a total area of 1.44 sq cm performed by [name], MD. With the following instrument(s): Curette to remove Non-Viable  tissue/material. Material removed includes Slough, Skin: Dermis, and Skin: Epidermis after achieving pain control using Lidocaine 5% topical ointment. No specimens were taken. A time out was conducted at 12:50, prior to the start of the procedure. A Minimum amount of bleeding was controlled  with Pressure. The procedure was tolerated well with a pain level of 0 throughout and a pain level of 0 following the procedure. Post Debridement Measurements: 1.2cm length x 1.2cm width x 0.9cm depth; 1.018cm^3 volume. Post debridement Stage noted as Category/Stage IV. Character of Wound/Ulcer Post Debridement is improved. Post procedure Diagnosis Wound #1: Same as Pre-Procedure Plan Follow-up Appointments: Return Appointment in 1 week. - Dr. Celine Ahr RM 1 with Vaughan Basta Anesthetic: Wound #1 Sacrum: (In clinic) Topical Lidocaine 4% applied to wound bed Bathing/ Shower/ Hygiene: May shower with protection but do not get wound dressing(s) wet. Off-Loading: Gel mattress overlay (Group 1) Turn and reposition every 2 hours Home Health: No change in wound care orders this week; continue Home Health for wound care. May utilize formulary equivalent dressing for wound treatment orders unless otherwise specified. Dressing changes to be completed by [facility] on Monday / Wednesday / Friday except when patient has scheduled visit at [facility]. Other Home Health Orders/Instructions: Jackquline [place] The following medication(s) was prescribed: lidocaine topical 5 % ointment ointment topical was prescribed at facility WOUND #1: - Sacrum Wound Laterality: Peri-Wound Care: Triamcinolone 15 (g) 3 x Per Week/30 Days Discharge Instructions: Use triamcinolone 15 (g)to rash daily as needed Peri-Wound Care: Zinc Oxide Ointment 30g tube 3 x Per Week/30 Days Discharge Instructions: Apply Zinc Oxide to macerated periwound with each dressing change as needed Topical: Keystone antibiotic compound 3 x Per Week/30 Days Discharge  Instructions: in base of wound under gauze Prim Dressing: Medline Woven Gauze Sponges 4x4 (in/in) 3 x Per Week/30 Days ary Discharge Instructions: pack into wound, moisten with saline and Keystone compound, smaller dressing over wound to hold packing in wouind Secondary Dressing: MPM Excel SAP Bordered Dressing, 7x6.7 (Sacral) (in/in) 3 x Per Week/30 Days Discharge Instructions: Apply silicone border over primary dressing as directed. 06/03/2022: The wound is smaller and shallower with less undermining. He has built up macerated senescent skin around the margins, as per usual. I used a curette to debride slough and macerated skin from the wound. We will continue to use his Keystone topical antibiotic compound with moist gauze packing. Follow-up in 1 week. Electronic Signature(s) Signed: 06/03/2022 1:00:47 PM By: [name] MD FACS Entered By: [name] on 06/03/2022 13:00:47 -------------------------------------------------------------------------------- HxROS Details Patient Name: Date of Service: [name], [name] 06/03/2022 12:30 PM Medical Record Number: [id number] Patient Account Number: [id number] Date of Birth/Sex: Treating RN: July 26, 1966 (56 y.o. [name] Primary Care Provider: [place] Delphina Cahill Other Clinician: Referring Provider: Treating Provider/Extender: [name] MO RRISO [name] Weeks in Treatment: 20 Information Obtained From Patient Caregiver Chart Eyes Medical History: Negative for: Cataracts; Glaucoma; Optic Neuritis Ear/Nose/Mouth/Throat Medical History: Negative for: Chronic sinus problems/congestion; Middle ear problems Respiratory Medical History: Past Medical History Notes: pulmonary nodules Cardiovascular Medical History: Positive for: Congestive Heart Failure; Hypertension Endocrine Medical History: Positive for: Type II Diabetes Negative for: Type I Diabetes Time with diabetes: since 2004 Treated with: Insulin Blood  sugar tested every day: Yes Tested : 4 times per day Genitourinary Medical History: Negative for: End Stage Renal Disease Past Medical History Notes: renal mass, urinary hesitancy Integumentary (Skin) Medical History: Negative for: History of Burn Musculoskeletal Medical History: Positive for: Osteomyelitis - S1 and coccyx Past Medical History Notes: discitis of thoracic region Oncologic Medical History: Negative for: Received Chemotherapy; Received Radiation Psychiatric Medical History: Positive for: Confinement Anxiety Negative for: Anorexia/bulimia Immunizations Pneumococcal Vaccine: Received Pneumococcal Vaccination: No Implantable Devices No devices added Hospitalization /  Surgery History Type of Hospitalization/Surgery bil BKA Family and Social History Cancer: Yes - Father; Diabetes: No; Heart Disease: Yes - Father,Paternal Grandparents; Hereditary Spherocytosis: No; Hypertension: Yes - Father; Kidney Disease: No; Lung Disease: No; Seizures: No; Stroke: No; Thyroid Problems: No; Tuberculosis: No; Never smoker; Marital Status - Divorced; Alcohol Use: Never; Drug Use: No History; Caffeine Use: Moderate - coffee; Financial Concerns: No; Food, Clothing or Shelter Needs: No; Support System Lacking: No; Transportation Concerns: Yes - hurts to transfer Electronic Signature(s) Signed: 06/03/2022 1:02:06 PM By: [name] MD FACS Signed: 06/04/2022 5:24:26 PM By: Baruch Gouty RN, BSN Entered By: [name] on 06/03/2022 12:59:44 -------------------------------------------------------------------------------- SuperBill Details Patient Name: Date of Service: [name], [name] 06/03/2022 Medical Record Number: [id number] Patient Account Number: [id number] Date of Birth/Sex: Treating RN: 1965-12-06 (56 y.o. [name] Primary Care Provider: [place] Delphina Cahill Other Clinician: Referring Provider: Treating Provider/Extender: [name] MO RRISO [name] Weeks in Treatment: 20 Diagnosis Coding ICD-10 Codes Code Description L89.154 Pressure ulcer of sacral region, stage 4 Z89.512 Acquired absence of left leg below knee Z89.511 Acquired absence of right leg below knee Facility Procedures CPT4 Code: [id number] Description: [phone] - DEBRIDE WOUND 1ST 20 SQ CM OR < ICD-10 Diagnosis Description L89.154 Pressure ulcer of sacral region, stage 4 Modifier: Quantity: 1 Physician Procedures : CPT4 Code Description Modifier [id number] - WC PHYS LEVEL 4 - EST PT 25 ICD-10 Diagnosis Description L89.154 Pressure ulcer of sacral region, stage 4 Z89.512 Acquired absence of left leg below knee Z89.511 Acquired absence of right leg below knee Quantity: 1 Electronic Signature(s) Signed: 06/03/2022 1:01:14 PM By: [name] MD FACS Entered By: [name] on 06/03/2022 13:01:13
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
## 2022-06-05 ENCOUNTER — Other Ambulatory Visit: Payer: Self-pay

## 2022-06-05 DIAGNOSIS — L89154 Pressure ulcer of sacral region, stage 4: Secondary | ICD-10-CM | POA: Diagnosis not present

## 2022-06-05 NOTE — Telephone Encounter (Signed)
He called me earlier today to give me his side of the story. Spoke with him again today, he stated that he will reach out to his insurance company [facility]) to see if he would be able to start with a new home health care company. As he is supposed to be with [facility] until 11/21. Then he will call the office back to update me on this.

## 2022-06-09 NOTE — Progress Notes (Signed)
Spoke with Abigail Butts at [facility] triage, pt ef 35 % per lov note cardiology tessa conta pa 04-14-2022 epic, pt does not meet [facility] guidelines and should be done at main or.

## 2022-06-12 ENCOUNTER — Encounter (HOSPITAL_BASED_OUTPATIENT_CLINIC_OR_DEPARTMENT_OTHER): Payer: Medicare HMO | Attending: General Surgery | Admitting: General Surgery

## 2022-06-12 DIAGNOSIS — I509 Heart failure, unspecified: Secondary | ICD-10-CM | POA: Diagnosis not present

## 2022-06-12 DIAGNOSIS — E11622 Type 2 diabetes mellitus with other skin ulcer: Secondary | ICD-10-CM | POA: Insufficient documentation

## 2022-06-12 DIAGNOSIS — Z89512 Acquired absence of left leg below knee: Secondary | ICD-10-CM | POA: Insufficient documentation

## 2022-06-12 DIAGNOSIS — I110 Hypertensive heart disease with heart failure: Secondary | ICD-10-CM | POA: Insufficient documentation

## 2022-06-12 DIAGNOSIS — G8929 Other chronic pain: Secondary | ICD-10-CM | POA: Diagnosis not present

## 2022-06-12 DIAGNOSIS — L89154 Pressure ulcer of sacral region, stage 4: Secondary | ICD-10-CM | POA: Diagnosis not present

## 2022-06-12 DIAGNOSIS — Z89511 Acquired absence of right leg below knee: Secondary | ICD-10-CM | POA: Insufficient documentation

## 2022-06-12 NOTE — Patient Instructions (Addendum)
SURGICAL WAITING ROOM VISITATION Patients having surgery or a procedure may have no more than 2 support people in the waiting area - these visitors may rotate.   Children under the age of 68 must have an adult with them who is not the patient. If the patient needs to stay at the hospital during part of their recovery, the visitor guidelines for inpatient rooms apply. Pre-op nurse will coordinate an appropriate time for 1 support person to accompany patient in pre-op.  This support person may not rotate.    Please refer to the [facility] website for the visitor guidelines for Inpatients (after your surgery is over and you are in a regular room).    Your procedure is scheduled on: 06/18/22   Report to [facility] Main Entrance    Report to admitting at 7:15 AM   Call this number if you have problems the morning of surgery [phone]   Do not eat food :After Midnight.   After Midnight you may have the following liquids until 6:30 AM DAY OF SURGERY  Water Non-Citrus Juices (without pulp, NO RED) Carbonated Beverages Black Coffee (NO MILK/CREAM OR CREAMERS, sugar ok)  Clear Tea (NO MILK/CREAM OR CREAMERS, sugar ok) regular and decaf                             Plain Jell-O (NO RED)                                           Fruit ices (not with fruit pulp, NO RED)                                     Popsicles (NO RED)                                                               Sports drinks like Gatorade (NO RED)                      If you have questions, please contact your surgeon's office.   FOLLOW BOWEL PREP AND ANY ADDITIONAL PRE OP INSTRUCTIONS YOU RECEIVED FROM YOUR SURGEON'S OFFICE!!!     Oral Hygiene is also important to reduce your risk of infection.                                    Remember - BRUSH YOUR TEETH THE MORNING OF SURGERY WITH YOUR REGULAR TOOTHPASTE   Take these medicines the morning of surgery with A SIP OF WATER: Oxycodone, Flomax   DO NOT  TAKE ANY ORAL DIABETIC MEDICATIONS DAY OF YOUR SURGERY  How to Manage Your Diabetes Before and After Surgery  Why is it important to control my blood sugar before and after surgery? Improving blood sugar levels before and after surgery helps healing and can limit problems. A way of improving blood sugar control is eating a healthy diet by:  Eating less sugar and carbohydrates  Increasing  activity/exercise  Talking with your doctor about reaching your blood sugar goals High blood sugars (greater than 180 mg/dL) can raise your risk of infections and slow your recovery, so you will need to focus on controlling your diabetes during the weeks before surgery. Make sure that the doctor who takes care of your diabetes knows about your planned surgery including the date and location.  How do I manage my blood sugar before surgery? Check your blood sugar at least 4 times a day, starting 2 days before surgery, to make sure that the level is not too high or low. Check your blood sugar the morning of your surgery when you wake up and every 2 hours until you get to the Short Stay unit. If your blood sugar is less than 70 mg/dL, you will need to treat for low blood sugar: Do not take insulin. Treat a low blood sugar (less than 70 mg/dL) with  cup of clear juice (cranberry or apple), 4 glucose tablets, OR glucose gel. Recheck blood sugar in 15 minutes after treatment (to make sure it is greater than 70 mg/dL). If your blood sugar is not greater than 70 mg/dL on recheck, call [phone] for further instructions. Report your blood sugar to the short stay nurse when you get to Short Stay.  If you are admitted to the hospital after surgery: Your blood sugar will be checked by the staff and you will probably be given insulin after surgery (instead of oral diabetes medicines) to make sure you have good blood sugar levels. The goal for blood sugar control after surgery is 80-180 mg/dL.   WHAT DO I DO ABOUT  MY DIABETES MEDICATION?  Do not take oral diabetes medicines (pills) the morning of surgery.  THE DAY BEFORE SURGERY, take regular morning dose of insulin. Take 70% of evening insulin dose.       THE MORNING OF SURGERY, do not take insulin   Reviewed and Endorsed by [facility] Patient Education Committee, August 2015                               You may not have any metal on your body including jewelry, and body piercing             Do not wear lotions, powders, cologne, or deodorant              Men may shave face and neck.   Do not bring valuables to the hospital. [place].   Contacts, dentures or bridgework may not be worn into surgery.   Bring small overnight bag day of surgery.   DO NOT [place]. PHARMACY WILL DISPENSE MEDICATIONS LISTED ON YOUR MEDICATION LIST TO YOU DURING YOUR ADMISSION [place]!              Please read over the following fact sheets you were given: IF [place] [phone] Schneiders   If you received a COVID test during your pre-op visit  it is requested that you wear a mask when out in public, stay away from anyone that may not be feeling well and notify your surgeon if you develop symptoms. If you test positive for Covid or have been in contact with anyone that has tested positive in  the last 10 days please notify you surgeon.      - Preparing for Surgery Before surgery, you can play an important role.  Because skin is not sterile, your skin needs to be as free of germs as possible.  You can reduce the number of germs on your skin by washing with CHG (chlorahexidine gluconate) soap before surgery.  CHG is an antiseptic cleaner which kills germs and bonds with the skin to continue killing germs even after washing. Please DO NOT use if you have an allergy to CHG or antibacterial soaps.  If your skin  becomes reddened/irritated stop using the CHG and inform your nurse when you arrive at Short Stay. Do not shave (including legs and underarms) for at least 48 hours prior to the first CHG shower.  You may shave your face/neck.  Please follow these instructions carefully:  1.  Shower with CHG Soap the night before surgery and the  morning of surgery.  2.  If you choose to wash your hair, wash your hair first as usual with your normal  shampoo.  3.  After you shampoo, rinse your hair and body thoroughly to remove the shampoo.                             4.  Use CHG as you would any other liquid soap.  You can apply chg directly to the skin and wash.  Gently with a scrungie or clean washcloth.  5.  Apply the CHG Soap to your body ONLY FROM THE NECK DOWN.   Do   not use on face/ open                           Wound or open sores. Avoid contact with eyes, ears mouth and   genitals (private parts).                       Wash face,  Genitals (private parts) with your normal soap.             6.  Wash thoroughly, paying special attention to the area where your    surgery  will be performed.  7.  Thoroughly rinse your body with warm water from the neck down.  8.  DO NOT shower/wash with your normal soap after using and rinsing off the CHG Soap.                9.  Pat yourself dry with a clean towel.            10.  Wear clean pajamas.            11.  Place clean sheets on your bed the night of your first shower and do not  sleep with pets. Day of Surgery : Do not apply any lotions/deodorants the morning of surgery.  Please wear clean clothes to the hospital/surgery center.  FAILURE TO FOLLOW THESE INSTRUCTIONS MAY RESULT IN THE CANCELLATION OF YOUR SURGERY  PATIENT SIGNATURE_________________________________  NURSE SIGNATURE__________________________________  ________________________________________________________________________

## 2022-06-12 NOTE — Progress Notes (Addendum)
COVID Vaccine Completed: no  Date of COVID positive in last 90 days: no  PCP - Berniece Pap, MD Cardiologist - Sanda Klein, MD  Chest x-ray - 12/28/21 Epic EKG - 12/29/21 Epic Stress Test - n/a ECHO - 07/29/21 Epic Cardiac Cath - n/a Pacemaker/ICD device last checked: n/a Spinal Cord Stimulator:n/a  Bowel Prep - no  Sleep Study - n/a CPAP -   Fasting Blood Sugar - 250-300 Checks Blood Sugar 2 times a day  Blood Thinner Instructions: n/a Aspirin Instructions: Last Dose:  Activity level: Can  perform activities of daily living without stopping and without symptoms of chest pain or shortness of breath. Not able to walk per pt he can transfer from wheelchair with walker. States he can preform ADLs independently. Has below knee prosthetics bilaterally.       Anesthesia review: HTN, CHF, DM2, a flutter, pulmonary nodules, CAD, sacral wound, bilat BKA. Patient stated he has not taken his insulin in 3 months as it was making him feel bad. He has been checking sugars at home and said 'they are too high'. BS was 450 at PAT. Informed patient that his surgery could be cancelled if BS is greater than 250 DOS or A1C is too high. He said that he will take his insulin the next few days and he plans to meet with his PCP in Nov to get a referral for an endocrinologist. Has an open wound to sacrum that he reports is the size of a dime and 0.5cm deep. He follows with the wound clinic. Creatinine 1.68, A1C 11.9  Patient denies shortness of breath, fever, cough and chest pain at PAT appointment  Patient verbalized understanding of instructions that were given to them at the PAT appointment. Patient was also instructed that they will need to review over the PAT instructions again at home before surgery.

## 2022-06-15 ENCOUNTER — Encounter (HOSPITAL_COMMUNITY)
Admission: RE | Admit: 2022-06-15 | Discharge: 2022-06-15 | Disposition: A | Discharge status: Home / Self Care | Payer: Medicare HMO | Source: Ambulatory Visit | Attending: Surgery | Admitting: Surgery

## 2022-06-15 ENCOUNTER — Encounter (HOSPITAL_COMMUNITY): Payer: Self-pay | Admitting: Certified Registered Nurse Anesthetist

## 2022-06-15 ENCOUNTER — Encounter (HOSPITAL_COMMUNITY): Payer: Self-pay | Admitting: Physician Assistant

## 2022-06-15 ENCOUNTER — Encounter (HOSPITAL_COMMUNITY): Payer: Self-pay

## 2022-06-15 VITALS — BP 134/84 | HR 104 | Temp 97.7°F | Resp 16 | Ht 75.0 in | Wt 202.0 lb

## 2022-06-15 DIAGNOSIS — Z87891 Personal history of nicotine dependence: Secondary | ICD-10-CM | POA: Insufficient documentation

## 2022-06-15 DIAGNOSIS — Z01812 Encounter for preprocedural laboratory examination: Secondary | ICD-10-CM | POA: Insufficient documentation

## 2022-06-15 DIAGNOSIS — N183 Chronic kidney disease, stage 3 unspecified: Secondary | ICD-10-CM | POA: Insufficient documentation

## 2022-06-15 DIAGNOSIS — Z794 Long term (current) use of insulin: Secondary | ICD-10-CM | POA: Diagnosis not present

## 2022-06-15 DIAGNOSIS — K4020 Bilateral inguinal hernia, without obstruction or gangrene, not specified as recurrent: Secondary | ICD-10-CM | POA: Diagnosis not present

## 2022-06-15 DIAGNOSIS — E1142 Type 2 diabetes mellitus with diabetic polyneuropathy: Secondary | ICD-10-CM | POA: Insufficient documentation

## 2022-06-15 DIAGNOSIS — K429 Umbilical hernia without obstruction or gangrene: Secondary | ICD-10-CM | POA: Insufficient documentation

## 2022-06-15 DIAGNOSIS — Z89511 Acquired absence of right leg below knee: Secondary | ICD-10-CM | POA: Diagnosis not present

## 2022-06-15 DIAGNOSIS — Z89512 Acquired absence of left leg below knee: Secondary | ICD-10-CM | POA: Diagnosis not present

## 2022-06-15 DIAGNOSIS — F4310 Post-traumatic stress disorder, unspecified: Secondary | ICD-10-CM | POA: Insufficient documentation

## 2022-06-15 DIAGNOSIS — E1122 Type 2 diabetes mellitus with diabetic chronic kidney disease: Secondary | ICD-10-CM | POA: Insufficient documentation

## 2022-06-15 HISTORY — DX: Malignant (primary) neoplasm, unspecified: C80.1

## 2022-06-15 HISTORY — DX: Osteomyelitis, unspecified: M86.9

## 2022-06-15 HISTORY — DX: Personal history of urinary calculi: Z87.442

## 2022-06-15 LAB — BASIC METABOLIC PANEL
Anion gap: 8 (ref 5–15)
BUN: 26 mg/dL — ABNORMAL HIGH (ref 6–20)
CO2: 24 mmol/L (ref 22–32)
Calcium: 8.8 mg/dL — ABNORMAL LOW (ref 8.9–10.3)
Chloride: 100 mmol/L (ref 98–111)
Creatinine, Ser: 1.68 mg/dL — ABNORMAL HIGH (ref 0.61–1.24)
GFR, Estimated: 47 mL/min — ABNORMAL LOW (ref >60)
Glucose, Bld: 421 mg/dL — ABNORMAL HIGH (ref 70–99)
Potassium: 4.3 mmol/L (ref 3.5–5.1)
Sodium: 132 mmol/L — ABNORMAL LOW (ref 135–145)

## 2022-06-15 LAB — GLUCOSE, CAPILLARY: Glucose-Capillary: 450 mg/dL — ABNORMAL HIGH (ref 70–99)

## 2022-06-15 LAB — HEMOGLOBIN A1C
Hgb A1c MFr Bld: 11.9 % — ABNORMAL HIGH (ref 4.8–5.6)
Mean Plasma Glucose: 294.83 mg/dL

## 2022-06-15 LAB — NO BLOOD PRODUCTS

## 2022-06-15 LAB — CBC
HCT: 55.1 % — ABNORMAL HIGH (ref 39.0–52.0)
Hemoglobin: 18.6 g/dL — ABNORMAL HIGH (ref 13.0–17.0)
MCH: 30.7 pg (ref 26.0–34.0)
MCHC: 33.8 g/dL (ref 30.0–36.0)
MCV: 91.1 fL (ref 80.0–100.0)
Platelets: 194 10*3/uL (ref 150–400)
RBC: 6.05 MIL/uL — ABNORMAL HIGH (ref 4.22–5.81)
RDW: 12.5 % (ref 11.5–15.5)
WBC: 5.4 10*3/uL (ref 4.0–10.5)
nRBC: 0 % (ref 0.0–0.2)

## 2022-06-15 NOTE — Progress Notes (Signed)
HARACE, MCCLUNEY ([id number]) [id number] Page 1 of 9 Visit Report for 06/12/2022 Chief Complaint Document Details Patient Name: Date of Service: [name], [name] 06/12/2022 2:45 PM Medical Record Number: [id number] Patient Account Number: [id number] Date of Birth/Sex: Treating RN: [birth year] (56 y.o. M) Primary Care Provider: MO RRISO [name] Other Clinician: Referring Provider: Treating Provider/Extender: [name] MO RRISO [name] Weeks in Treatment: 22 Information Obtained from: Patient Chief Complaint Patient is at the clinic for treatment of an open pressure ulcer Electronic Signature(s) Signed: 06/12/2022 3:37:34 PM By: [name] MD FACS Entered By: [name] on 06/12/2022 15:37:34 -------------------------------------------------------------------------------- Debridement Details Patient Name: Date of Service: [name], [name] 06/12/2022 2:45 PM Medical Record Number: [id number] Patient Account Number: [id number] Date of Birth/Sex: Treating RN: May 05, 1966 (56 y.o. [name] Primary Care Provider: MO Delphina Cahill Other Clinician: Referring Provider: Treating Provider/Extender: [name] MO RRISO [name] Weeks in Treatment: 22 Debridement Performed for Assessment: Wound #1 Sacrum Performed By: Physician [name], MD Debridement Type: Debridement Level of Consciousness (Pre-procedure): Awake and Alert Pre-procedure Verification/Time Out Yes - 15:05 Taken: Start Time: 15:08 Pain Control: Lidocaine 5% topical ointment T Area Debrided (L x W): otal 1.2 (cm) x 1 (cm) = 1.2 (cm) Tissue and other material debrided: Non-Viable, Skin: Epidermis Level: Skin/Epidermis Debridement Description: Selective/Open Wound Instrument: Curette Bleeding: Minimum Hemostasis Achieved: Pressure Procedural Pain: 0 Post Procedural Pain: 0 Response to Treatment: Procedure was tolerated well Level of Consciousness  (Post- Awake and Alert procedure): Post Debridement Measurements of Total Wound Length: (cm) 1.2 Stage: Category/Stage IV Width: (cm) 1 Depth: (cm) 1.2 Volume: (cm) 1.131 Character of Wound/Ulcer Post Debridement: Improved Post Procedure Diagnosis Same as [name] ([id number]) [id number] Page 2 of 9 Electronic Signature(s) Signed: 06/12/2022 4:42:57 PM By: [name] MD FACS Signed: 06/15/2022 4:29:03 PM By: Baruch Gouty RN, BSN Entered By: Baruch Gouty on 06/12/2022 15:11:23 -------------------------------------------------------------------------------- HPI Details Patient Name: Date of Service: [name], [name] 06/12/2022 2:45 PM Medical Record Number: [id number] Patient Account Number: [id number] Date of Birth/Sex: Treating RN: 05/20/1966 (56 y.o. M) Primary Care Provider: MO Delphina Cahill Other Clinician: Referring Provider: Treating Provider/Extender: [name] MO RRISO [name] Weeks in Treatment: 22 History of Present Illness HPI Description: ADMISSION 01/08/2022 This is a 56 year old male with a past medical history notable for type 2 diabetes mellitus (last A1c was 8.6) congestive heart failure, hypertension, chronic pain, and bilateral below-knee amputations. His most recent amputation was in November 2022. While he was in the hospital, he developed a sacral pressure ulcer. He was subsequently in a skilled nursing facility for some time. He was discharged with home health and had been in a wound VAC, but was then admitted to the hospital last week when the wound appeared to be worsening. Apparently the periwound skin was macerated and the device that he had been using was leaking quite a bit. Evaluation while in the hospital included a consultation with infectious disease who did not think he had any evidence of osteomyelitis, plastic surgery who felt that he was not an appropriate flap candidate (their  note also states that he is not interested in a flap) and wound care who took him out of the wound VAC and initiated wet-to-dry dressing changes. He has a new wound VAC from [facility] on order, anticipated delivery today. The wound itself is fairly small and isolated to the sacrum. There is muscle exposed. No bone is appreciated but  it is palpable beneath the surface. The muscle itself is bit pale and there is heaped up epibole around the wound edges. No significant odor or drainage. 01/14/2022: His wound VAC was not initiated until this past Friday. He has not had any issues with the VAC but today we found that the bridge foam was applied directly to the skin rather than over a layer of adhesive drape. His home health nurse also requested that we consider applying silver collagen to the wound bed in addition to the VAC. There is still a little bit of heaped up senescent skin around the wound periphery. No significant change to the wound dimensions. 01/21/2022: No significant change to the wound dimensions. The senescent skin has not reaccumulated. The periwound skin remains a bit macerated but without any obvious breakdown. The wound surface itself has a shiny appearance with a little bit of slough accumulation; no true robust granulation tissue at this time. 01/28/2022: The wound is about the same size but a little bit shallower. There is a little bit of senescent skin reaccumulation at the cranial aspect. The periwound skin is red but not macerated and without any tissue breakdown. The wound still does not have the most robust surface. There is a bit of slough accumulation. 02/04/2022: The wound is a bit smaller and the undermining has come in somewhat. There continues to be granulation tissue formation within the wound bed. No significant slough accumulation and his periwound skin is in better condition. 02/18/2022: The wound stinks today. There is no obvious pus but the drainage and wound itself are  malodorous. He continues to have heaped up tissue within the wound bed that is rather grayish and not particularly robust-appearing. He is very angry today with his situation. 02/25/2022: Last week, in response to the odor coming from the wound, I took a culture and prescribed topical gentamicin as well as oral cefdinir. Apparently [facility] contacted him about a topical compounded antibiotic, but he did not realize this and he hung up on them. Today, the odor is no longer present. There is some senescent skin around the wound margins as well as continued heaped up granulation tissue in the [name] of the wound. The undermining continues to contract. 03/04/2022: The wound continues to contract and look better. He still has heaped up hypertrophic granulation tissue near the orifice. No odor was coming from the wound today. He is awaiting [place] delivery. 03/18/2022: 2-week follow-up. [facility] topical antibiotic has been initiated. The chemical cauterization of the hypertrophic granulation tissue was quite successful and the surface is much flatter today. He has heaped up senescent skin around the perimeter. His home health providers have figured out a way to keep the wound VAC from losing suction by bolstering with DuoDERM. Overall there has been significant improvement since our last visit. 04/01/2022: 2-week follow-up. The wound continues to contract. Once again, there is heaped up senescent skin around the perimeter. He has a little bit of skin breakdown in the distribution of the adhesive drape from the wound VAC. No odor or purulent drainage. No concern for infection. 04/15/2022: 2-week follow-up. He has developed a fairly substantial rash from the drape adhesive for his wound VAC. The periwound has a lot of heaped up epiboly at the margins. The tissue in the wound bed is a little bit pale but there is no odor or purulent drainage. 04/22/2022: Last week we discontinued the VAC. Today, the skin around his  wound is in significantly improved condition. His rash is resolving.  He does have some heaped up senescent skin around the wound margin and a layer of slough on the wound surface, but there is no concern for active infection. 04/29/2022: The wound continues to contract. The surface is clean. He continues to build up senescent skin around the wound edges that subsequently get a little bit macerated. No concern for infection. 05/06/2022: The wound is smaller again today in all dimensions. The surface is clean. He has his usual accumulation of macerated senescent skin around the wound edges. 05/13/2022: Continued wound contracture. The undermining has decreased quite a bit. Clean surface. Senescent skin heaped up around the wound edges, as per usual. PRIMO, INNIS ([id number]) [phone].pdf Page 3 of 9 05/22/2022: The macerated senescent skin has accumulated once again. The wound dimensions are slightly smaller and the surface is clean. He has been having difficulty keeping the packing in the wound due to the large foam border dressing that he prefers. 06/03/2022: The wound is smaller and shallower with less undermining. He has built up macerated senescent skin around the margins, as per usual. He and his friend have figured out a way to make sure that his packing stays in the wound with his [place]. 06/12/2022: The wound is a little bit shallower again today. He still has accumulated senescent periwound skin, but otherwise the wound is clean. He will be undergoing bilateral recurrent inguinal hernia repair and umbilical hernia repair next week. Electronic Signature(s) Signed: 06/12/2022 3:38:21 PM By: [name] MD FACS Entered By: [name] on 06/12/2022 15:38:21 -------------------------------------------------------------------------------- Physical Exam Details Patient Name: Date of Service: [name], [name] 06/12/2022 2:45 PM Medical Record Number:  [id number] Patient Account Number: [id number] Date of Birth/Sex: Treating RN: [birth year] (56 y.o. M) Primary Care Provider: MO RRISO [name] Other Clinician: Referring Provider: Treating Provider/Extender: [name] MO RRISO N, RYA N Weeks in Treatment: 22 Constitutional . Slightly tachycardic, asymptomatic. . . No acute distress.Marland Kitchen Respiratory Normal work of breathing on room air.. Notes 06/12/2022: The wound is a little bit shallower again today. He still has accumulated senescent periwound skin, but otherwise the wound is clean. Electronic Signature(s) Signed: 06/12/2022 3:47:09 PM By: [name] MD FACS Entered By: [name] on 06/12/2022 15:47:09 -------------------------------------------------------------------------------- Physician Orders Details Patient Name: Date of Service: [name], [name] 06/12/2022 2:45 PM Medical Record Number: [id number] Patient Account Number: [id number] Date of Birth/Sex: Treating RN: 17-Oct-1965 (56 y.o. [name] Primary Care Provider: MO Delphina Cahill Other Clinician: Referring Provider: Treating Provider/Extender: [name] MO RRISO [name] Weeks in Treatment: 22 Verbal / Phone Orders: No Diagnosis Coding ICD-10 Coding Code Description L89.154 Pressure ulcer of sacral region, stage 4 Z89.512 Acquired absence of left leg below knee Z89.511 Acquired absence of right leg below knee Follow-up Appointments ppointment in 1 week. - Dr. Celine Ahr RM 1 with [facility] Return A Wednesday [place] ([id number]) [id number] Page 4 of 9 Anesthetic Wound #1 Sacrum (In clinic) Topical Lidocaine 5% applied to wound bed Bathing/ Shower/ Hygiene May shower with protection but do not get wound dressing(s) wet. Off-Loading Gel mattress overlay (Group 1) Turn and reposition every 2 hours Wound Treatment Wound #1 - Sacrum Peri-Wound Care: Triamcinolone 15 (g) 3 x Per Week/30 Days Discharge  Instructions: Use triamcinolone 15 (g)to rash daily as needed Peri-Wound Care: Zinc Oxide Ointment 30g tube 3 x Per Week/30 Days Discharge Instructions: Apply Zinc Oxide to macerated periwound with each dressing change as needed Topical: [facility] antibiotic compound 3 x Per Week/30  Days Discharge Instructions: in base of wound under gauze Prim Dressing: Medline Woven Gauze Sponges 4x4 (in/in) 3 x Per Week/30 Days ary Discharge Instructions: pack into wound, moisten with saline and [facility] compound, smaller dressing over wound to hold packing in wouind Secondary Dressing: MPM Excel SAP Bordered Dressing, 7x6.7 (Sacral) (in/in) 3 x Per Week/30 Days Discharge Instructions: Apply silicone border over primary dressing as directed. Electronic Signature(s) Signed: 06/12/2022 4:42:57 PM By: [name] MD FACS Entered By: [name] on 06/12/2022 16:02:00 -------------------------------------------------------------------------------- Problem List Details Patient Name: Date of Service: [name], [name] 06/12/2022 2:45 PM Medical Record Number: [id number] Patient Account Number: [id number] Date of Birth/Sex: Treating RN: [birth year] (56 y.o. [name] Primary Care Provider: MO Delphina Cahill Other Clinician: Referring Provider: Treating Provider/Extender: [name] MO RRISO [name] Weeks in Treatment: 22 Active Problems ICD-10 Encounter Code Description Active Date MDM Diagnosis L89.154 Pressure ulcer of sacral region, stage 4 01/08/2022 No Yes Z89.512 Acquired absence of left leg below knee 01/08/2022 No Yes Z89.511 Acquired absence of right leg below knee 01/08/2022 No Yes Inactive Problems Resolved Problems TAVIO, BIEGEL ([id number]) [phone].pdf Page 5 of 9 Electronic Signature(s) Signed: 06/12/2022 3:34:24 PM By: [name] MD FACS Entered By: [name] on 06/12/2022  15:34:24 -------------------------------------------------------------------------------- Progress Note Details Patient Name: Date of Service: [name], [name] 06/12/2022 2:45 PM Medical Record Number: [id number] Patient Account Number: [id number] Date of Birth/Sex: Treating RN: Apr 28, 1966 (56 y.o. M) Primary Care Provider: MO RRISO [name] Other Clinician: Referring Provider: Treating Provider/Extender: [name] MO RRISO [name] Weeks in Treatment: 22 Subjective Chief Complaint Information obtained from Patient Patient is at the clinic for treatment of an open pressure ulcer History of Present Illness (HPI) ADMISSION 01/08/2022 This is a 56 year old male with a past medical history notable for type 2 diabetes mellitus (last A1c was 8.6) congestive heart failure, hypertension, chronic pain, and bilateral below-knee amputations. His most recent amputation was in November 2022. While he was in the hospital, he developed a sacral pressure ulcer. He was subsequently in a skilled nursing facility for some time. He was discharged with home health and had been in a wound VAC, but was then admitted to the hospital last week when the wound appeared to be worsening. Apparently the periwound skin was macerated and the device that he had been using was leaking quite a bit. Evaluation while in the hospital included a consultation with infectious disease who did not think he had any evidence of osteomyelitis, plastic surgery who felt that he was not an appropriate flap candidate (their note also states that he is not interested in a flap) and wound care who took him out of the wound VAC and initiated wet-to-dry dressing changes. He has a new wound VAC from [facility] on order, anticipated delivery today. The wound itself is fairly small and isolated to the sacrum. There is muscle exposed. No bone is appreciated but it is palpable beneath the surface. The muscle itself is bit pale and there is heaped up  epibole around the wound edges. No significant odor or drainage. 01/14/2022: His wound VAC was not initiated until this past Friday. He has not had any issues with the VAC but today we found that the bridge foam was applied directly to the skin rather than over a layer of adhesive drape. His home health nurse also requested that we consider applying silver collagen to the wound bed in addition to the VAC. There is  still a little bit of heaped up senescent skin around the wound periphery. No significant change to the wound dimensions. 01/21/2022: No significant change to the wound dimensions. The senescent skin has not reaccumulated. The periwound skin remains a bit macerated but without any obvious breakdown. The wound surface itself has a shiny appearance with a little bit of slough accumulation; no true robust granulation tissue at this time. 01/28/2022: The wound is about the same size but a little bit shallower. There is a little bit of senescent skin reaccumulation at the cranial aspect. The periwound skin is red but not macerated and without any tissue breakdown. The wound still does not have the most robust surface. There is a bit of slough accumulation. 02/04/2022: The wound is a bit smaller and the undermining has come in somewhat. There continues to be granulation tissue formation within the wound bed. No significant slough accumulation and his periwound skin is in better condition. 02/18/2022: The wound stinks today. There is no obvious pus but the drainage and wound itself are malodorous. He continues to have heaped up tissue within the wound bed that is rather grayish and not particularly robust-appearing. He is very angry today with his situation. 02/25/2022: Last week, in response to the odor coming from the wound, I took a culture and prescribed topical gentamicin as well as oral cefdinir. Apparently [facility] contacted him about a topical compounded antibiotic, but he did not realize this and  he hung up on them. Today, the odor is no longer present. There is some senescent skin around the wound margins as well as continued heaped up granulation tissue in the [name] of the wound. The undermining continues to contract. 03/04/2022: The wound continues to contract and look better. He still has heaped up hypertrophic granulation tissue near the orifice. No odor was coming from the wound today. He is awaiting [place] delivery. 03/18/2022: 2-week follow-up. [facility] topical antibiotic has been initiated. The chemical cauterization of the hypertrophic granulation tissue was quite successful and the surface is much flatter today. He has heaped up senescent skin around the perimeter. His home health providers have figured out a way to keep the wound VAC from losing suction by bolstering with DuoDERM. Overall there has been significant improvement since our last visit. 04/01/2022: 2-week follow-up. The wound continues to contract. Once again, there is heaped up senescent skin around the perimeter. He has a little bit of skin breakdown in the distribution of the adhesive drape from the wound VAC. No odor or purulent drainage. No concern for infection. 04/15/2022: 2-week follow-up. He has developed a fairly substantial rash from the drape adhesive for his wound VAC. The periwound has a lot of heaped up epiboly at the margins. The tissue in the wound bed is a little bit pale but there is no odor or purulent drainage. 04/22/2022: Last week we discontinued the VAC. Today, the skin around his wound is in significantly improved condition. His rash is resolving. He does have some heaped up senescent skin around the wound margin and a layer of slough on the wound surface, but there is no concern for active infection. 04/29/2022: The wound continues to contract. The surface is clean. He continues to build up senescent skin around the wound edges that subsequently get a little bit macerated. No concern for  infection. 05/06/2022: The wound is smaller again today in all dimensions. The surface is clean. He has his usual accumulation of macerated senescent skin around the wound edges. [name], [name] ([id number]) [id number]  Page 6 of 9 05/13/2022: Continued wound contracture. The undermining has decreased quite a bit. Clean surface. Senescent skin heaped up around the wound edges, as per usual. 05/22/2022: The macerated senescent skin has accumulated once again. The wound dimensions are slightly smaller and the surface is clean. He has been having difficulty keeping the packing in the wound due to the large foam border dressing that he prefers. 06/03/2022: The wound is smaller and shallower with less undermining. He has built up macerated senescent skin around the margins, as per usual. He and his friend have figured out a way to make sure that his packing stays in the wound with his [name]. 06/12/2022: The wound is a little bit shallower again today. He still has accumulated senescent periwound skin, but otherwise the wound is clean. He will be undergoing bilateral recurrent inguinal hernia repair and umbilical hernia repair next week. Patient History Information obtained from Patient, Caregiver, Chart. Family History Cancer - Father, Heart Disease - Father,Paternal Grandparents, Hypertension - Father, No family history of Diabetes, Hereditary Spherocytosis, Kidney Disease, Lung Disease, Seizures, Stroke, Thyroid Problems, Tuberculosis. Social History Never smoker, Marital Status - Divorced, Alcohol Use - Never, Drug Use - No History, Caffeine Use - Moderate - coffee. Medical History Eyes Denies history of Cataracts, Glaucoma, Optic Neuritis Ear/Nose/Mouth/Throat Denies history of Chronic sinus problems/congestion, Middle ear problems Cardiovascular Patient has history of Congestive Heart Failure, Hypertension Endocrine Patient has history of Type II Diabetes Denies  history of Type I Diabetes Genitourinary Denies history of End Stage Renal Disease Integumentary (Skin) Denies history of History of Burn Musculoskeletal Patient has history of Osteomyelitis - S1 and coccyx Oncologic Denies history of Received Chemotherapy, Received Radiation Psychiatric Patient has history of Confinement Anxiety Denies history of Anorexia/bulimia Hospitalization/Surgery History - bil BKA. Medical A Surgical History Notes nd Respiratory pulmonary nodules Genitourinary renal mass, urinary hesitancy Musculoskeletal discitis of thoracic region Objective Constitutional Slightly tachycardic, asymptomatic. No acute distress.. Vitals Time Taken: 2:40 PM, Temperature: 97.6 F, Pulse: 103 bpm, Respiratory Rate: 18 breaths/min, Blood Pressure: 133/73 mmHg, Capillary Blood Glucose: 195 mg/dl. Respiratory Normal work of breathing on room air.. General Notes: 06/12/2022: The wound is a little bit shallower again today. He still has accumulated senescent periwound skin, but otherwise the wound is clean. Integumentary (Hair, Skin) Wound #1 status is Open. Original cause of wound was Pressure Injury. The date acquired was: 07/29/2021. The wound has been in treatment 22 weeks. The wound is located on the Sacrum. The wound measures 1.2cm length x 1cm width x 1.2cm depth; 0.942cm^2 area and 1.131cm^3 volume. There is Fat Layer (Subcutaneous Tissue) exposed. There is no tunneling noted, however, there is undermining starting at 12:00 and ending at 6:00 with a maximum distance of 1.4cm. There is a medium amount of serosanguineous drainage noted. The wound margin is epibole. There is large (67-100%) red, pink granulation within the wound bed. There is a small (1-33%) amount of necrotic tissue within the wound bed. The periwound skin appearance had no abnormalities noted for texture. The periwound skin appearance had no abnormalities noted for color. The periwound skin appearance  exhibited: Maceration. The periwound skin appearance did not exhibit: Dry/Scaly. Periwound temperature was noted as No Abnormality. [name], [name] ([id number]) [id number] Page 7 of 9 Assessment Active Problems ICD-10 Pressure ulcer of sacral region, stage 4 Acquired absence of left leg below knee Acquired absence of right leg below knee Procedures Wound #1 Pre-procedure diagnosis of Wound #1 is a Pressure Ulcer located on the Sacrum .  There was a Selective/Open Wound Skin/Epidermis Debridement with a total area of 1.2 sq cm performed by [name], MD. With the following instrument(s): Curette to remove Non-Viable tissue/material. Material removed includes Skin: Epidermis after achieving pain control using Lidocaine 5% topical ointment. No specimens were taken. A time out was conducted at 15:05, prior to the start of the procedure. A Minimum amount of bleeding was controlled with Pressure. The procedure was tolerated well with a pain level of 0 throughout and a pain level of 0 following the procedure. Post Debridement Measurements: 1.2cm length x 1cm width x 1.2cm depth; 1.131cm^3 volume. Post debridement Stage noted as Category/Stage IV. Character of Wound/Ulcer Post Debridement is improved. Post procedure Diagnosis Wound #1: Same as Pre-Procedure Plan Follow-up Appointments: Return Appointment in 1 week. - Dr. Celine Ahr RM 1 with [name] Wednesday Anesthetic: Wound #1 Sacrum: (In clinic) Topical Lidocaine 5% applied to wound bed Bathing/ Shower/ Hygiene: May shower with protection but do not get wound dressing(s) wet. Off-Loading: Gel mattress overlay (Group 1) Turn and reposition every 2 hours WOUND #1: - Sacrum Wound Laterality: Peri-Wound Care: Triamcinolone 15 (g) 3 x Per Week/30 Days Discharge Instructions: Use triamcinolone 15 (g)to rash daily as needed Peri-Wound Care: Zinc Oxide Ointment 30g tube 3 x Per Week/30 Days Discharge Instructions:  Apply Zinc Oxide to macerated periwound with each dressing change as needed Topical: Keystone antibiotic compound 3 x Per Week/30 Days Discharge Instructions: in base of wound under gauze Prim Dressing: Medline Woven Gauze Sponges 4x4 (in/in) 3 x Per Week/30 Days ary Discharge Instructions: pack into wound, moisten with saline and Keystone compound, smaller dressing over wound to hold packing in wouind Secondary Dressing: MPM Excel SAP Bordered Dressing, 7x6.7 (Sacral) (in/in) 3 x Per Week/30 Days Discharge Instructions: Apply silicone border over primary dressing as directed. 06/12/2022: The wound is a little bit shallower again today. He still has accumulated senescent periwound skin, but otherwise the wound is clean. I used a curette to debride the moist senescent periwound skin. We will continue to pack the wound with gauze moistened with his Keystone topical compounded antibiotic. Follow-up in 1 week. Electronic Signature(s) Signed: 06/12/2022 4:03:58 PM By: [name] MD FACS Entered By: [name] on 06/12/2022 16:03:58 -------------------------------------------------------------------------------- HxROS Details Patient Name: Date of Service: [name], [place] 06/12/2022 2:45 PM Medical Record Number: [id number] Patient Account Number: [id number] Date of Birth/Sex: Treating RN: 1966-02-25 (56 y.o. [name], [name] ([id number]) [id number] Page 8 of 9 Primary Care Provider: [place] RRISO [name] Other Clinician: Referring Provider: Treating Provider/Extender: [name] [place] RRISO [name] Weeks in Treatment: 22 Information Obtained From Patient Caregiver Chart Eyes Medical History: Negative for: Cataracts; Glaucoma; Optic Neuritis Ear/Nose/Mouth/Throat Medical History: Negative for: Chronic sinus problems/congestion; Middle ear problems Respiratory Medical History: Past Medical History Notes: pulmonary nodules Cardiovascular Medical  History: Positive for: Congestive Heart Failure; Hypertension Endocrine Medical History: Positive for: Type II Diabetes Negative for: Type I Diabetes Time with diabetes: since 2004 Treated with: Insulin Blood sugar tested every day: Yes Tested : 4 times per day Genitourinary Medical History: Negative for: End Stage Renal Disease Past Medical History Notes: renal mass, urinary hesitancy Integumentary (Skin) Medical History: Negative for: History of Burn Musculoskeletal Medical History: Positive for: Osteomyelitis - S1 and coccyx Past Medical History Notes: discitis of thoracic region Oncologic Medical History: Negative for: Received Chemotherapy; Received Radiation Psychiatric Medical History: Positive for: Confinement Anxiety Negative for: Anorexia/bulimia Immunizations Pneumococcal Vaccine: Received Pneumococcal Vaccination: No Implantable Devices No devices added  Hospitalization / Surgery History Type of Hospitalization/Surgery bil BKA ANTERO, DEROSIA ([id number]) [id number] Page 9 of 9 Family and Social History Cancer: Yes - Father; Diabetes: No; Heart Disease: Yes - Father,Paternal Grandparents; Hereditary Spherocytosis: No; Hypertension: Yes - Father; Kidney Disease: No; Lung Disease: No; Seizures: No; Stroke: No; Thyroid Problems: No; Tuberculosis: No; Never smoker; Marital Status - Divorced; Alcohol Use: Never; Drug Use: No History; Caffeine Use: Moderate - coffee; Financial Concerns: No; Food, Clothing or Shelter Needs: No; Support System Lacking: No; Transportation Concerns: Yes - hurts to transfer Electronic Signature(s) Signed: 06/12/2022 4:42:57 PM By: [name] MD FACS Entered By: [name] on 06/12/2022 15:38:25 -------------------------------------------------------------------------------- SuperBill Details Patient Name: Date of Service: [name], [place] 06/12/2022 Medical Record Number: [id number] Patient  Account Number: [id number] Date of Birth/Sex: Treating RN: Apr 21, 1966 (56 y.o. M) Primary Care Provider: MO RRISO [name] Other Clinician: Referring Provider: Treating Provider/Extender: [name] [place] RRISO [name] Weeks in Treatment: 22 Diagnosis Coding ICD-10 Codes Code Description L89.154 Pressure ulcer of sacral region, stage 4 Z89.512 Acquired absence of left leg below knee Z89.511 Acquired absence of right leg below knee Facility Procedures : CPT4 Code: 62831517 Description: 61607 - DEBRIDE WOUND 1ST 20 SQ CM OR < ICD-10 Diagnosis Description L89.154 Pressure ulcer of sacral region, stage 4 Modifier: Quantity: 1 Physician Procedures : CPT4 Code Description Modifier [id number] - WC PHYS LEVEL 4 - EST PT 25 ICD-10 Diagnosis Description L89.154 Pressure ulcer of sacral region, stage 4 Quantity: 1 : [id number] - WC PHYS DEBR WO ANESTH 20 SQ CM ICD-10 Diagnosis Description L89.154 Pressure ulcer of sacral region, stage 4 Quantity: 1 Electronic Signature(s) Signed: 06/12/2022 4:04:19 PM By: [name] MD FACS Entered By: [name] on 06/12/2022 16:04:19

## 2022-06-16 NOTE — Progress Notes (Signed)
A1C 11.9, creatinine 1.68, and blood sugar 450 results routed to Dr. Ninfa Linden

## 2022-06-16 NOTE — Progress Notes (Signed)
Anesthesia Chart Review   Case: [id number] Date/Time: 06/18/22 0915   Procedures:      LAPAROSCOPIC BILATERAL INGUINAL HERNIA REPAIR WITH MESH (Bilateral)     HERNIA REPAIR UMBILICAL ADULT   Anesthesia type: General   Pre-op diagnosis: BILATERAL INGUINAL HERNIA, UMBILICAL HERNIA   Location: [facility] ROOM 04 / [facility]   Surgeons: Coralie Keens, MD       DISCUSSION:56 y.o. former smoker with h/o PTSD, CKD Stage III, uncontrolled diabetes, Bilateral bka, bilateral inguinal hernia, umbilical hernia scheduled for above procedure 06/18/2022 with Dr. Coralie Keens.   H/o spinal osteomyelitis. Stage IV pressure ulcer.   Pt last seen by PCP 04/23/2022.   Concern for renal cell carcinoma, pt declines surgery.   Last seen by cardiology 04/14/2022.  EF 35-40%. Per OV note pt euvolemic and SOB stable. 1 year follow up recommended.   Discussed uncontrolled diabetes with Dr. Trevor Mace triage nurse including need for better diabetes control before proceeding.  VS: BP 134/84   Pulse (!) 104   Temp 36.5 C (Oral)   Resp 16   Ht '6\' 3"'  (1.905 m) Comment: with prosthetic  Wt 91.6 kg Comment: with prosthetic  SpO2 97%   BMI 25.25 kg/m   PROVIDERS: Loralee Pacas, MD is PCP   Cardiologist - Sanda Klein, MD   LABS:  forwarded to surgeon and PCP  (all labs ordered are listed, but only abnormal results are displayed)  Labs Reviewed  GLUCOSE, CAPILLARY - Abnormal; Notable for the following components:      Result Value   Glucose-Capillary 450 (*)    All other components within normal limits  BASIC METABOLIC PANEL - Abnormal; Notable for the following components:   Sodium 132 (*)    Glucose, Bld 421 (*)    BUN 26 (*)    Creatinine, Ser 1.68 (*)    Calcium 8.8 (*)    GFR, Estimated 47 (*)    All other components within normal limits  HEMOGLOBIN A1C - Abnormal; Notable for the following components:   Hgb A1c MFr Bld 11.9 (*)    All other components within normal limits  CBC - Abnormal;  Notable for the following components:   RBC 6.05 (*)    Hemoglobin 18.6 (*)    HCT 55.1 (*)    All other components within normal limits  NO BLOOD PRODUCTS     IMAGES:   EKG:   CV: Echo 07/29/21 1. Left ventricular ejection fraction, by estimation, is 35 to 40%. The  left ventricle has moderately decreased function. The left ventricle  demonstrates global hypokinesis.   2. Right ventricular systolic function is moderately reduced. The right  ventricular size is normal.   3. Left atrial size was mildly dilated. No left atrial/left atrial  appendage thrombus was detected.   4. Right atrial size was mildly dilated.   5. The mitral valve is normal in structure. Trivial mitral valve  regurgitation. No evidence of mitral stenosis.   6. The aortic valve is normal in structure. Aortic valve regurgitation is  trivial. No aortic stenosis is present.   7. The inferior vena cava is normal in size with greater than 50%  respiratory variability, suggesting right atrial pressure of 3 mmHg.   8. Cannot exclude a small PFO.  Past Medical History:  Diagnosis Date   Acquired complex renal cyst 09/25/2019   AKI (acute kidney injury) ([place]) 07/22/2021   Cancer ([place])    renal   Chest pain 08/65/7846   Complication  of anesthesia    Decubitus ulcer of sacral area 12/15/2021   Depression    Diabetes mellitus without complication ([place])    Healthcare maintenance 09/25/2019   Hematuria 09/25/2019   History of kidney stones    Lactic acidosis 12/15/2021   Leukocytosis 09/13/2021   Osteomyelitis ([place])    Sacral pressure injury of skin 07/27/2021   Septic shock ([place]) 07/22/2021   Severe sepsis with acute organ dysfunction ([place]) 07/22/2021   Shortness of breath 05/16/2021   Stage 3a chronic kidney disease ([place]) 11/65/7903   Systolic dysfunction    Type 2 diabetes mellitus 10/12/2019    Past Surgical History:  Procedure Laterality Date   AMPUTATION Left 07/23/2021   Procedure: LEFT BELOW  KNEE AMPUTATION;  Surgeon: [name], MD;  Location: [facility];  Service: Orthopedics;  Laterality: Left;   BELOW KNEE LEG AMPUTATION Right    BUBBLE STUDY  07/29/2021   Procedure: BUBBLE STUDY;  Surgeon: Sueanne Margarita, MD;  Location: [facility];  Service: Cardiovascular;;   CARDIOVERSION N/A 07/29/2021   Procedure: CARDIOVERSION;  Surgeon: Sueanne Margarita, MD;  Location: [facility] ENDOSCOPY;  Service: Cardiovascular;  Laterality: N/A;   RADIOLOGY WITH ANESTHESIA N/A 08/05/2021   Procedure: MRI LUMBAR WITH AND WITHOUT; THORASIC SPINE WITH AND WITHOUT WITH ANESTHESIA;  Surgeon: Radiologist, Medication, MD;  Location: [facility];  Service: Radiology;  Laterality: N/A;   RADIOLOGY WITH ANESTHESIA N/A 08/07/2021   Procedure: MRI WITH LUMBER WITH AND WITHOUT CONTRAST,THORACIC WITH AND WITHOUT CONTRAST;  Surgeon: Radiologist, Medication, MD;  Location: [facility];  Service: Radiology;  Laterality: N/A;   RADIOLOGY WITH ANESTHESIA N/A 09/13/2021   Procedure: MRI WITH ANESTHESIA;  Surgeon: Luanne Bras, MD;  Location: [facility];  Service: Radiology;  Laterality: N/A;   TEE WITHOUT CARDIOVERSION N/A 07/29/2021   Procedure: TRANSESOPHAGEAL ECHOCARDIOGRAM (TEE);  Surgeon: Sueanne Margarita, MD;  Location: [facility] ENDOSCOPY;  Service: Cardiovascular;  Laterality: N/A;    MEDICATIONS:  blood glucose meter kit and supplies KIT   furosemide (LASIX) 20 MG tablet   insulin NPH-regular Human (NOVOLIN 70/30 RELION) (70-30) 100 UNIT/ML injection   Insulin Pen Needle 32G X 4 MM MISC   lisinopril (ZESTRIL) 10 MG tablet   Oxycodone HCl 10 MG TABS   PRESCRIPTION MEDICATION   PRESCRIPTION MEDICATION   protein supplement shake (PREMIER PROTEIN) LIQD   tamsulosin (FLOMAX) 0.4 MG CAPS capsule   testosterone cypionate (DEPOTESTOSTERONE CYPIONATE) 200 MG/ML injection   triamcinolone cream (KENALOG) 0.1 %   No current facility-administered medications for this encounter.   Konrad Felix Ward, PA-C [facility] [phone]

## 2022-06-17 ENCOUNTER — Telehealth: Payer: Self-pay | Admitting: Internal Medicine

## 2022-06-17 ENCOUNTER — Other Ambulatory Visit: Payer: Self-pay

## 2022-06-17 DIAGNOSIS — E291 Testicular hypofunction: Secondary | ICD-10-CM | POA: Diagnosis not present

## 2022-06-17 DIAGNOSIS — I5022 Chronic systolic (congestive) heart failure: Secondary | ICD-10-CM | POA: Diagnosis not present

## 2022-06-17 DIAGNOSIS — G4700 Insomnia, unspecified: Secondary | ICD-10-CM | POA: Diagnosis not present

## 2022-06-17 DIAGNOSIS — L89154 Pressure ulcer of sacral region, stage 4: Secondary | ICD-10-CM | POA: Diagnosis not present

## 2022-06-17 NOTE — Telephone Encounter (Signed)
Pt states: -Would like to change pharmacy to [facility]. -He needs Accucheck Guide Test Strips    LAST APPOINTMENT DATE:   04/23/22 OV with PCP   NEXT APPOINTMENT DATE: 07/24/22 OV with PCP   MEDICATION: Accucheck Guide - Test Strips   Is the patient out of medication?  Soon   PHARMACY: Pharmacy at [facility] [phone] [address] [place],  [place], [place] [address] [phone]

## 2022-06-18 ENCOUNTER — Ambulatory Visit (HOSPITAL_COMMUNITY): Admission: RE | Admit: 2022-06-18 | Payer: Medicare HMO | Source: Home / Self Care | Admitting: Surgery

## 2022-06-18 ENCOUNTER — Other Ambulatory Visit: Payer: Self-pay

## 2022-06-18 ENCOUNTER — Telehealth: Payer: Self-pay | Admitting: Internal Medicine

## 2022-06-18 ENCOUNTER — Encounter (HOSPITAL_COMMUNITY): Admission: RE | Payer: Self-pay | Source: Home / Self Care

## 2022-06-18 DIAGNOSIS — E1165 Type 2 diabetes mellitus with hyperglycemia: Secondary | ICD-10-CM

## 2022-06-18 DIAGNOSIS — E1142 Type 2 diabetes mellitus with diabetic polyneuropathy: Secondary | ICD-10-CM

## 2022-06-18 SURGERY — REPAIR, HERNIA, INGUINAL, BILATERAL, LAPAROSCOPIC
Anesthesia: General

## 2022-06-18 MED ORDER — GLUCOSE BLOOD VI STRP: ORAL_STRIP | 12 refills | Status: DC

## 2022-06-18 NOTE — Telephone Encounter (Signed)
Spoke with patient and informed him that I have sent the test strips to the [facility] (pharmacy was already updated in chart). He wants to get an insulin pump and would need the orders for that. His recent labs done on 10/9 showed elevated RBC count, hemoglobin, HCT and other labs. His glucose especially is at 421! He was supposed to have his hernia surgery today and was unable to due to these elevated labs. Please advise. I informed him that I will give him a call back tomorrow after discussing this with you.

## 2022-06-18 NOTE — Telephone Encounter (Signed)
Patient requests to be called at ph# [phone] for status of RX request for test strips (phone note 06/17/22).  Patient states he would like to discuss Dr. Ninfa Linden from [facility] (Patient was supposed to have surgery today 06/18/22)

## 2022-06-18 NOTE — Telephone Encounter (Signed)
Pt states: -recent 10/09 labs showed abnormal levels of: --Red blood cells --Glucose  -Dr. Renard Hamper has notes in the system about these recent results, concerning an upcoming hernia surgery -Because of his disability, he would like an insulin pump to better regulate his sugars.    Pt asks: -Does he have an infection since his red blood cells are elevated? -How can he get an insulin pump; referral to endocrinology or can PCP team ake this happen.     Pt requests: -Call back with follow up.

## 2022-06-19 ENCOUNTER — Other Ambulatory Visit: Payer: Self-pay

## 2022-06-22 ENCOUNTER — Encounter: Payer: Self-pay | Admitting: Internal Medicine

## 2022-06-22 ENCOUNTER — Ambulatory Visit (INDEPENDENT_AMBULATORY_CARE_PROVIDER_SITE_OTHER): Payer: Medicare HMO | Admitting: Internal Medicine

## 2022-06-22 ENCOUNTER — Other Ambulatory Visit: Payer: Self-pay

## 2022-06-22 VITALS — BP 138/66 | HR 107 | Temp 98.6°F | Resp 14 | Ht 75.0 in

## 2022-06-22 DIAGNOSIS — E1165 Type 2 diabetes mellitus with hyperglycemia: Secondary | ICD-10-CM

## 2022-06-22 DIAGNOSIS — D751 Secondary polycythemia: Secondary | ICD-10-CM

## 2022-06-22 DIAGNOSIS — R5381 Other malaise: Secondary | ICD-10-CM | POA: Diagnosis not present

## 2022-06-22 DIAGNOSIS — E1142 Type 2 diabetes mellitus with diabetic polyneuropathy: Secondary | ICD-10-CM

## 2022-06-22 DIAGNOSIS — Z794 Long term (current) use of insulin: Secondary | ICD-10-CM | POA: Diagnosis not present

## 2022-06-22 MED ORDER — DEXCOM G6 TRANSMITTER MISC: 3 refills | Status: DC

## 2022-06-22 MED ORDER — DEXCOM G6 SENSOR MISC: 11 refills | Status: DC

## 2022-06-22 MED ORDER — MINIMED 770G INSULIN PUMP SYS KIT: 1.0000 | PACK | Freq: Every day | 1 refills | Status: DC | PRN

## 2022-06-22 MED ORDER — DEXCOM G6 RECEIVER DEVI: 0 refills | Status: DC

## 2022-06-22 NOTE — Assessment & Plan Note (Signed)
He has an acute polycythemia of unclear origin I recommend he stay hydrated and take aspirin 81 mg daily for this as well as his uncontrolled diabetes with cardiac risk

## 2022-06-22 NOTE — Telephone Encounter (Signed)
Patient was seen today for an office visit.

## 2022-06-22 NOTE — Telephone Encounter (Signed)
Patient is coming in today for an office visit with Dr. Randol Kern to discuss.

## 2022-06-22 NOTE — Assessment & Plan Note (Signed)
Assessment/Plan:   end of the appointment though he brought up that he is having some headache in the back of his head and just feels really bad and maybe some intermittent confusion (curently alert and focused)- so that given his history of osteomyelitis and sick stage IV decubitus I am very concerned for a bloodstream infection but labs already closed so he plans to come back to do lab work and blood cultures.  Advised him to have a low threshold to return to the hospital as this could be a very serious infection despite his antibiotics

## 2022-06-22 NOTE — Progress Notes (Signed)
[place] at [employer]:  [id number]   Routine Medical Office Visit  Patient:  [name]      Age: 56 y.o.       Sex:  male  Date:   06/22/2022  PCP:    Loralee Pacas, [place] Provider: Loralee Pacas, MD     Joseguadalupe was seen today for follow-up.  Type 2 diabetes mellitus with hyperglycemia, unspecified whether long term insulin use (HCC) -     MiniMed 770G Insulin Pump Sys; 1 each by Does not apply route daily as needed.  Dispense: 1 kit; Refill: 1 -     Dexcom G6 Sensor; New sensor every 10 days  Dispense: 3 each; Refill: 11 -     Dexcom G6 Transmitter; Please provide once every 3 months  Dispense: 1 each; Refill: 3 -     Dexcom G6 Receiver; Please provide 1 receiver  Dispense: 1 each; Refill: 0  Type 2 diabetes mellitus with diabetic polyneuropathy, with long-term current use of insulin [facility]) Assessment & Plan: Endocrine referral pending- but we focused on improving in meantime very uncontrolled.  Lab Results  Component Value Date   HGBA1C 11.9 (H) 06/15/2022    He has been checking his sugar closely for the last last week and using his insulin more regularly and its been very painful for him.  He reports some of the average numbers in the last week have been in the 100-1 40 range despite recent A1c of over 11 however it is very challenging for him to remain motivated to continue with this as it is very painful for him to do both the fingersticks and the insulin sticks. So technically he remains extremely uncontrolled despite the one good week because he does not think he can continue this for a long period he pleads with me desperately to help him get an insulin pump to enable him to do a good job controlling his diabetes and the long-term without such pain and misery.  He cannot afford the pens and so the injections are much more painful than having to use a pen and he is having to do 4 to 5 injections a day and his sugar has been  tending to go too low too high if he does not stay super aggressive on top of it and it is just too hard to maintain that level of hyperfocus.  Says that it is type II historically he was told but is also says that it is never has never had his insulin levels checked and that the pills that he tried never assisted with his sugar and so I suspect he may actually have type 1 diabetes we will going to order some additional testing for that the next time we do blood but we just did it last week and not sure if it is worthwhile we do not expected to change management either way were going to go with insulin since that is the only thing that is worked for him in the past  He is having difficulty controlling his insulin with injections at home to control the sugar he has a lot of lability and brittle diabetes he is not sure if it is type I or type 2 diabetes but he feels like he needs a tandem insulin pump in order to get it under control and I agree and so I am going to prescribe those products and hopefully we will go to get them prior  Auth.  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  So there is a tumor on his left kidney and concern for diabetic nephropathy.  If we do not get his sugar under control with optimized management using  insulin pump and tandem CGM he could suffer serious consequences to his health as a result.    Orders: -     Culture, blood (single) w Reflex to ID Panel -     Culture, blood (single) w Reflex to ID Panel -     CBC -     Comprehensive metabolic panel  Malaise Assessment & Plan: Assessment/Plan:   end of the appointment though he brought up that he is having some headache in the back of his head and just feels really bad and maybe some intermittent confusion (curently alert and focused)- so that given his history of osteomyelitis and sick stage IV decubitus I am very concerned for a bloodstream infection but labs already closed so he plans to come back to do lab work and blood cultures.  Advised him to have a low threshold to return to the hospital as this could be a very serious infection despite his antibiotics  Orders: -     Sedimentation rate -     C-reactive protein  Polycythemia Assessment & Plan: He has an acute polycythemia of unclear origin I recommend he stay hydrated and take aspirin 81 mg daily for this as well as his uncontrolled diabetes with cardiac risk       Return in about 1 month (around 07/23/2022) for dm mgmt very uncontrolled.  hopefully get cgm and pump working if not already.    Medication Discussion: Common side effects, risks, benefits, and alternatives for medications and treatment plan prescribed today were discussed, and he expressed understanding of the given instructions.  Medication list was reconciled and provided to the patient in the AVS. Patient instructions and summary information was reviewed with him as documented in the AVS.   Need to go to ER Discussion: We discussed Red Flag symptoms and signs in detail. He expressed understanding regarding what to do in case of urgent or emergency type symptoms.  He was advised to call the office or go to ER if his condition worsens.   Follow Up Treatment Plan Discussion: He is encouraged to call or  message via MyChart if he has any questions or concerns regarding our treatment plan.  No barriers to understanding were identified Additional information was provided in the AVS (see AVS) regarding the diagnosis/treatment plan for him to review and this note is shared on his MyChart for review.    Subjective:   Markel Kurtenbach is a 56 y.o. male with PMH significant for: Past Medical History:  Diagnosis Date   Acquired complex renal cyst 09/25/2019   AKI (  acute kidney injury) ([place]) 07/22/2021   Cancer ([place])    renal   Chest pain 28/78/6767   Complication of anesthesia    Decubitus ulcer of sacral area 12/15/2021   Depression    Diabetes mellitus without complication ([place])    Healthcare maintenance 09/25/2019   Hematuria 09/25/2019   History of kidney stones    Lactic acidosis 12/15/2021   Leukocytosis 09/13/2021   Osteomyelitis ([place])    Sacral pressure injury of skin 07/27/2021   Septic shock ([place]) 07/22/2021   Severe sepsis with acute organ dysfunction ([place]) 07/22/2021   Shortness of breath 05/16/2021   Stage 3a chronic kidney disease ([place]) 20/94/7096   Systolic dysfunction    Type 2 diabetes mellitus 10/12/2019    He is presenting today with: Chief Complaint  Patient presents with   Follow-up    Review labs and discuss elevated glucose. Glucose has been 77-250, since starting insulin. Was 218 this morning due to eating last night. Getting headaches in posterior of head (pounding), more so when he wakes up. RBC was elevated, wants to discuss.     Additional physician collected history: See problem focused overviews            Objective:  Physical Exam: BP 138/66 (BP Location: Left Arm, Patient Position: Sitting)   Pulse (!) 107   Temp 98.6 F (37 C) (Temporal)   Resp 14   Ht '6\' 3"'  (1.905 m)   SpO2 93%   BMI 25.25 kg/m   He  is a polite, friendly, and genuine person Constitutional: NAD, AAO, not ill-appearing  Neuro: alert, no focal deficit obvious,  articulate speech Psych: normal mood, behavior, thought content   Problem specific physical exam findings:  Bilateral BKA  No results found for any visits on 06/22/22.   Recent Results (from the past 2160 hour(s))  POC Urinalysis dipstick     Status: Abnormal   Collection Time: 04/09/22  7:44 PM  Result Value Ref Range   Glucose, UA >=1000 (A) NEGATIVE mg/dL   Bilirubin Urine NEGATIVE NEGATIVE   Ketones, ur TRACE (A) NEGATIVE mg/dL   Specific Gravity, Urine 1.020 1.005 - 1.030   Hgb urine dipstick SMALL (A) NEGATIVE   pH 5.0 5.0 - 8.0   Protein, ur 100 (A) NEGATIVE mg/dL   Urobilinogen, UA 0.2 0.0 - 1.0 mg/dL   Nitrite POSITIVE (A) NEGATIVE   Leukocytes,Ua SMALL (A) NEGATIVE    Comment: Biochemical Testing Only. Please order routine urinalysis from main lab if confirmatory testing is needed.  Urine Culture     Status: Abnormal   Collection Time: 04/09/22  8:07 PM   Specimen: Urine, Clean Catch  Result Value Ref Range   Specimen Description URINE, CLEAN CATCH    Special Requests      NONE Performed at [facility], [place] [address] [place], [place] [address]    Culture >=100,000 COLONIES/mL PSEUDOMONAS AERUGINOSA (A)    Report Status 04/11/2022 FINAL    Organism ID, Bacteria PSEUDOMONAS AERUGINOSA (A)       Susceptibility   Pseudomonas aeruginosa - MIC*    CEFTAZIDIME 16 INTERMEDIATE Intermediate     CIPROFLOXACIN <=0.25 SENSITIVE Sensitive     GENTAMICIN 2 SENSITIVE Sensitive     IMIPENEM 2 SENSITIVE Sensitive     * >=100,000 COLONIES/mL PSEUDOMONAS AERUGINOSA  Glucose, capillary     Status: Abnormal   Collection Time: 06/15/22  2:11 PM  Result Value Ref Range   Glucose-Capillary 450 (H) 70 - 99 mg/dL  Comment: Glucose reference range applies only to samples taken after fasting for at least 8 hours.  No blood products     Status: None   Collection Time: 06/15/22  2:30 PM  Result Value Ref Range   Transfuse no blood products      TRANSFUSE NO BLOOD PRODUCTS,  VERIFIED BY Mariann Barter, RN 10.09.23  Performed at [facility], [place] [address], [place], [place] [address]   Basic metabolic panel per protocol     Status: Abnormal   Collection Time: 06/15/22  2:32 PM  Result Value Ref Range   Sodium 132 (L) 135 - 145 mmol/L   Potassium 4.3 3.5 - 5.1 mmol/L   Chloride 100 98 - 111 mmol/L   CO2 24 22 - 32 mmol/L   Glucose, Bld 421 (H) 70 - 99 mg/dL    Comment: Glucose reference range applies only to samples taken after fasting for at least 8 hours.   BUN 26 (H) 6 - 20 mg/dL   Creatinine, Ser 1.68 (H) 0.61 - 1.24 mg/dL   Calcium 8.8 (L) 8.9 - 10.3 mg/dL   GFR, Estimated 47 (L) >60 mL/min    Comment: (NOTE) Calculated using the CKD-EPI Creatinine Equation (2021)    Anion gap 8 5 - 15    Comment: Performed at [facility], [place] [address] [place], [place] [address]  Hemoglobin A1c per protocol     Status: Abnormal   Collection Time: 06/15/22  2:32 PM  Result Value Ref Range   Hgb A1c MFr Bld 11.9 (H) 4.8 - 5.6 %    Comment: (NOTE) Pre diabetes:          5.7%-6.4%  Diabetes:              >6.4%  Glycemic control for   <7.0% adults with diabetes    Mean Plasma Glucose 294.83 mg/dL    Comment: Performed at [facility], [place] [address], [place],  [address]  CBC per protocol     Status: Abnormal   Collection Time: 06/15/22  2:32 PM  Result Value Ref Range   WBC 5.4 4.0 - 10.5 K/uL   RBC 6.05 (H) 4.22 - 5.81 MIL/uL   Hemoglobin 18.6 (H) 13.0 - 17.0 g/dL   HCT 55.1 (H) 39.0 - 52.0 %   MCV 91.1 80.0 - 100.0 fL   MCH 30.7 26.0 - 34.0 pg   MCHC 33.8 30.0 - 36.0 g/dL   RDW 12.5 11.5 - 15.5 %   Platelets 194 150 - 400 K/uL   nRBC 0.0 0.0 - 0.2 %    Comment: Performed at [facility], [place] [address] [place],  [address]

## 2022-06-22 NOTE — Patient Instructions (Signed)
It was a pleasure seeing you today!  Today the plan is...  Type 2 diabetes mellitus with hyperglycemia, unspecified whether long term insulin use (HCC)  Type 2 diabetes mellitus with diabetic polyneuropathy, with long-term current use of insulin [facility]) Assessment & Plan: Endocrine referral pending- but we focused on improving in meantime very uncontrolled.  Lab Results  Component Value Date   HGBA1C 11.9 (H) 06/15/2022    He has been checking his sugar closely for the last last week and using his insulin more regularly and its been very painful for him.  He reports some of the average numbers in the last week have been in the 100-1 40 range despite recent A1c of over 11 however it is very challenging for him to remain motivated to continue with this as it is very painful for him to do both the fingersticks and the insulin sticks. So technically he remains extremely uncontrolled despite the one good week because he does not think he can continue this for a long period he pleads with me desperately to help him get an insulin pump to enable him to do a good job controlling his diabetes and the long-term without such pain and misery.  He cannot afford the pens and so the injections are much more painful than having to use a pen and he is having to do 4 to 5 injections a day and his sugar has been tending to go too low too high if he does not stay super aggressive on top of it and it is just too hard to maintain that level of hyperfocus.  Says that it is type II historically he was told but is also says that it is never has never had his insulin levels checked and that the pills that he tried never assisted with his sugar and so I suspect he may actually have type 1 diabetes we will going to order some additional testing for that the next time we do blood but we just did it last week and not sure if it is worthwhile we do not expected to change management either way were going to go with insulin since  that is the only thing that is worked for him in the past  He is having difficulty controlling his insulin with injections at home to control the sugar he has a lot of lability and brittle diabetes he is not sure if it is type I or type 2 diabetes but he feels like he needs a tandem insulin pump in order to get it under control and I agree and so I am going to prescribe those products and hopefully we will go to get them prior Auth.  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  So there is a tumor on his left kidney and concern  for diabetic nephropathy.  If we do not get his sugar under control with optimized management using insulin pump and tandem CGM he could suffer serious consequences to his health as a result.     Malaise Assessment & Plan: Assessment/Plan:   end of the appointment though he brought up that he is having some headache in the back of his head and just feels really bad and maybe some intermittent confusion (curently alert and focused)- so that given his history of osteomyelitis and sick stage IV decubitus I am very concerned for a bloodstream infection but labs already closed so he plans to come back to do lab work and blood cultures.  Advised him to have a low threshold to return to the hospital as this  could be a very serious infection despite his antibiotics      Loralee Pacas, MD   No follow-ups on file.   - If your condition fails to resolve or you have other questions / concerns: please contact me via phone [phone] or MyChart messaging.  - Please bring all your medicines to your next appointment. This is the best way for me to know exactly what you're taking.  - If your condition begins to worsen or become severe:  go to the ER.   IF you received an x-ray today, you will receive an invoice from [facility]. Please contact [facility] at [phone] with questions or concerns regarding your invoice.    IF you received labwork today, you will receive an invoice from [facility]. Please contact [facility] at [phone] with questions or concerns regarding your invoice.    Our billing staff will not be able to assist you with questions regarding bills from these companies.   --------------------------------------------------------------------------------------------------------------------  You will be contacted with the lab results as soon as they are available. The fastest way to get your results is to activate your My Chart account. Instructions are located on the last page of this paperwork. If you have not heard from [place] regarding the results in 2 weeks, please contact this office. For any labs or imaging tests, we will call you if the results are significantly abnormal.  Most normal results will be posted to myChart as soon as they are available and I will comment on them there within 2-3 business days.

## 2022-06-22 NOTE — Assessment & Plan Note (Addendum)
Endocrine referral pending- but we focused on improving in meantime very uncontrolled.  Lab Results  Component Value Date   HGBA1C 11.9 (H) 06/15/2022    He has been checking his sugar closely for the last last week and using his insulin more regularly and its been very painful for him.  He reports some of the average numbers in the last week have been in the 100-1 40 range despite recent A1c of over 11 however it is very challenging for him to remain motivated to continue with this as it is very painful for him to do both the fingersticks and the insulin sticks. So technically he remains extremely uncontrolled despite the one good week because he does not think he can continue this for a long period he pleads with me desperately to help him get an insulin pump to enable him to do a good job controlling his diabetes and the long-term without such pain and misery.  He cannot afford the pens and so the injections are much more painful than having to use a pen and he is having to do 4 to 5 injections a day and his sugar has been tending to go too low too high if he does not stay super aggressive on top of it and it is just too hard to maintain that level of hyperfocus.  Says that it is type II historically he was told but is also says that it is never has never had his insulin levels checked and that the pills that he tried never assisted with his sugar and so I suspect he may actually have type 1 diabetes we will going to order some additional testing for that the next time we do blood but we just did it last week and not sure if it is worthwhile we do not expected to change management either way were going to go with insulin since that is the only thing that is worked for him in the past  He is having difficulty controlling his insulin with injections at home to control the sugar he has a lot of lability and brittle diabetes he is not sure if it is type I or type 2 diabetes but he feels like he needs a  tandem insulin pump in order to get it under control and I agree and so I am going to prescribe those products and hopefully we will go to get them prior Auth.  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  So there is a tumor on his left kidney and concern for diabetic nephropathy.  If we do not get his sugar under control with optimized management using insulin pump and tandem CGM he could suffer serious consequences to his health as a result.

## 2022-06-23 ENCOUNTER — Other Ambulatory Visit (INDEPENDENT_AMBULATORY_CARE_PROVIDER_SITE_OTHER): Payer: Medicare HMO

## 2022-06-23 ENCOUNTER — Telehealth: Payer: Self-pay | Admitting: Internal Medicine

## 2022-06-23 DIAGNOSIS — R7989 Other specified abnormal findings of blood chemistry: Secondary | ICD-10-CM

## 2022-06-23 LAB — TESTOSTERONE: Testosterone: 556.94 ng/dL (ref 300.00–890.00)

## 2022-06-23 NOTE — Telephone Encounter (Signed)
Pt States: -Following up on missing lab orders for 06/22/22.   Pt was given information for [facility] walk-in lab and stated he plans to go there.

## 2022-06-24 ENCOUNTER — Encounter: Payer: Self-pay | Admitting: Internal Medicine

## 2022-06-24 NOTE — Progress Notes (Signed)
Lab Results      Component                Value               Date                      TESTOSTERONE             556.94              06/23/2022                TESTOSTERONE             94 (L)              12/05/2021                TESTOSTERONE             195 (L)             08/05/2021           So new value shows testosterone fine on whatever is current meds Please get clarification on what he is currently using for testosterone supplement, if anything.

## 2022-06-26 ENCOUNTER — Encounter (HOSPITAL_BASED_OUTPATIENT_CLINIC_OR_DEPARTMENT_OTHER): Payer: Medicare HMO | Admitting: Internal Medicine

## 2022-06-26 ENCOUNTER — Other Ambulatory Visit: Payer: Self-pay | Admitting: Internal Medicine

## 2022-06-26 ENCOUNTER — Telehealth: Payer: Self-pay | Admitting: Internal Medicine

## 2022-06-26 ENCOUNTER — Other Ambulatory Visit: Payer: Medicare HMO

## 2022-06-26 DIAGNOSIS — Z89511 Acquired absence of right leg below knee: Secondary | ICD-10-CM | POA: Diagnosis not present

## 2022-06-26 DIAGNOSIS — E349 Endocrine disorder, unspecified: Secondary | ICD-10-CM

## 2022-06-26 DIAGNOSIS — E11622 Type 2 diabetes mellitus with other skin ulcer: Secondary | ICD-10-CM | POA: Diagnosis not present

## 2022-06-26 DIAGNOSIS — I110 Hypertensive heart disease with heart failure: Secondary | ICD-10-CM | POA: Diagnosis not present

## 2022-06-26 DIAGNOSIS — L89154 Pressure ulcer of sacral region, stage 4: Secondary | ICD-10-CM | POA: Diagnosis not present

## 2022-06-26 DIAGNOSIS — Z89512 Acquired absence of left leg below knee: Secondary | ICD-10-CM | POA: Diagnosis not present

## 2022-06-26 DIAGNOSIS — G8929 Other chronic pain: Secondary | ICD-10-CM | POA: Diagnosis not present

## 2022-06-26 DIAGNOSIS — I509 Heart failure, unspecified: Secondary | ICD-10-CM | POA: Diagnosis not present

## 2022-06-26 NOTE — Progress Notes (Signed)
[name], [name] ([id number]) [id number] Page 1 of 7 Visit Report for 06/26/2022 Debridement Details Patient Name: Date of Service: [name], [name] 06/26/2022 2:00 PM Medical Record Number: [id number] Patient Account Number: [id number] Date of Birth/Sex: Treating RN: March 11, 1966 (56 y.o. M) Primary Care Provider: MO Delphina Cahill Other Clinician: Referring Provider: Treating Provider/Extender: Juluis Mire Weeks in Treatment: 24 Debridement Performed for Assessment: Wound #1 Sacrum Performed By: Physician Ricard Dillon., MD Debridement Type: Debridement Level of Consciousness (Pre-procedure): Awake and Alert Pre-procedure Verification/Time Out Yes - 14:15 Taken: Start Time: 14:18 Pain Control: Lidocaine 5% topical ointment T Area Debrided (L x W): otal 1.2 (cm) x 1.3 (cm) = 1.56 (cm) Tissue and other material debrided: Non-Viable, Callus, Skin: Epidermis Level: Skin/Epidermis Debridement Description: Selective/Open Wound Instrument: Curette Bleeding: Minimum Hemostasis Achieved: Pressure Procedural Pain: 0 Post Procedural Pain: 0 Response to Treatment: Procedure was tolerated well Level of Consciousness (Post- Awake and Alert procedure): Post Debridement Measurements of Total Wound Length: (cm) 1.2 Stage: Category/Stage IV Width: (cm) 1.3 Depth: (cm) 1.1 Volume: (cm) 1.348 Character of Wound/Ulcer Post Debridement: Improved Post Procedure Diagnosis Same as Pre-procedure Notes scribed by Baruch Gouty, RN for Dr. Dellia Nims Electronic Signature(s) Signed: 06/26/2022 4:47:23 PM By: Linton Ham MD Entered By: Linton Ham on 06/26/2022 15:06:37 -------------------------------------------------------------------------------- HPI Details Patient Name: Date of Service: [name], RO [place] 06/26/2022 2:00 PM Medical Record Number: [id number] Patient Account Number: [id number] Date of Birth/Sex: Treating  RN: October 11, 1965 (56 y.o. M) Primary Care Provider: MO Delphina Cahill Other Clinician: Referring Provider: Treating Provider/Extender: Juluis Mire Weeks in Treatment: 24 History of Present Illness [name], [name] ([id number]) [id number] Page 2 of 7 HPI Description: ADMISSION 01/08/2022 This is a 56 year old male with a past medical history notable for type 2 diabetes mellitus (last A1c was 8.6) congestive heart failure, hypertension, chronic pain, and bilateral below-knee amputations. His most recent amputation was in November 2022. While he was in the hospital, he developed a sacral pressure ulcer. He was subsequently in a skilled nursing facility for some time. He was discharged with home health and had been in a wound VAC, but was then admitted to the hospital last week when the wound appeared to be worsening. Apparently the periwound skin was macerated and the device that he had been using was leaking quite a bit. Evaluation while in the hospital included a consultation with infectious disease who did not think he had any evidence of osteomyelitis, plastic surgery who felt that he was not an appropriate flap candidate (their note also states that he is not interested in a flap) and wound care who took him out of the wound VAC and initiated wet-to-dry dressing changes. He has a new wound VAC from [facility] on order, anticipated delivery today. The wound itself is fairly small and isolated to the sacrum. There is muscle exposed. No bone is appreciated but it is palpable beneath the surface. The muscle itself is bit pale and there is heaped up epibole around the wound edges. No significant odor or drainage. 01/14/2022: His wound VAC was not initiated until this past Friday. He has not had any issues with the VAC but today we found that the bridge foam was applied directly to the skin rather than over a layer of adhesive drape. His home health nurse also  requested that we consider applying silver collagen to the wound bed in addition to the VAC. There is still a little bit  of heaped up senescent skin around the wound periphery. No significant change to the wound dimensions. 01/21/2022: No significant change to the wound dimensions. The senescent skin has not reaccumulated. The periwound skin remains a bit macerated but without any obvious breakdown. The wound surface itself has a shiny appearance with a little bit of slough accumulation; no true robust granulation tissue at this time. 01/28/2022: The wound is about the same size but a little bit shallower. There is a little bit of senescent skin reaccumulation at the cranial aspect. The periwound skin is red but not macerated and without any tissue breakdown. The wound still does not have the most robust surface. There is a bit of slough accumulation. 02/04/2022: The wound is a bit smaller and the undermining has come in somewhat. There continues to be granulation tissue formation within the wound bed. No significant slough accumulation and his periwound skin is in better condition. 02/18/2022: The wound stinks today. There is no obvious pus but the drainage and wound itself are malodorous. He continues to have heaped up tissue within the wound bed that is rather grayish and not particularly robust-appearing. He is very angry today with his situation. 02/25/2022: Last week, in response to the odor coming from the wound, I took a culture and prescribed topical gentamicin as well as oral cefdinir. Apparently [facility] contacted him about a topical compounded antibiotic, but he did not realize this and he hung up on them. Today, the odor is no longer present. There is some senescent skin around the wound margins as well as continued heaped up granulation tissue in the center of the wound. The undermining continues to contract. 03/04/2022: The wound continues to contract and look better. He still has heaped up  hypertrophic granulation tissue near the orifice. No odor was coming from the wound today. He is awaiting [facility] delivery. 03/18/2022: 2-week follow-up. [facility] topical antibiotic has been initiated. The chemical cauterization of the hypertrophic granulation tissue was quite successful and the surface is much flatter today. He has heaped up senescent skin around the perimeter. His home health providers have figured out a way to keep the wound VAC from losing suction by bolstering with DuoDERM. Overall there has been significant improvement since our last visit. 04/01/2022: 2-week follow-up. The wound continues to contract. Once again, there is heaped up senescent skin around the perimeter. He has a little bit of skin breakdown in the distribution of the adhesive drape from the wound VAC. No odor or purulent drainage. No concern for infection. 04/15/2022: 2-week follow-up. He has developed a fairly substantial rash from the drape adhesive for his wound VAC. The periwound has a lot of heaped up epiboly at the margins. The tissue in the wound bed is a little bit pale but there is no odor or purulent drainage. 04/22/2022: Last week we discontinued the VAC. Today, the skin around his wound is in significantly improved condition. His rash is resolving. He does have some heaped up senescent skin around the wound margin and a layer of slough on the wound surface, but there is no concern for active infection. 04/29/2022: The wound continues to contract. The surface is clean. He continues to build up senescent skin around the wound edges that subsequently get a little bit macerated. No concern for infection. 05/06/2022: The wound is smaller again today in all dimensions. The surface is clean. He has his usual accumulation of macerated senescent skin around the wound edges. 05/13/2022: Continued wound contracture. The undermining has decreased quite  a bit. Clean surface. Senescent skin heaped up around the wound  edges, as per usual. 05/22/2022: The macerated senescent skin has accumulated once again. The wound dimensions are slightly smaller and the surface is clean. He has been having difficulty keeping the packing in the wound due to the large foam border dressing that he prefers. 06/03/2022: The wound is smaller and shallower with less undermining. He has built up macerated senescent skin around the margins, as per usual. He and his friend have figured out a way to make sure that his packing stays in the wound with his [place]. 06/12/2022: The wound is a little bit shallower again today. He still has accumulated senescent periwound skin, but otherwise the wound is clean. He will be undergoing bilateral recurrent inguinal hernia repair and umbilical hernia repair next week. 10/20 sacral pressure ulcer. using [facility] and backing wet to dry. Has developed a rash sur rounding the wound Electronic Signature(s) Signed: 06/26/2022 4:47:23 PM By: Linton Ham MD Entered By: Linton Ham on 06/26/2022 15:08:10 -------------------------------------------------------------------------------- Physical Exam Details Patient Name: Date of Service: [name], [name] 06/26/2022 2:00 PM [name] ([id number]) [id number] Page 3 of 7 Medical Record Number: [id number] Patient Account Number: [id number] Date of Birth/Sex: Treating RN: 12/28/1965 (56 y.o. M) Primary Care Provider: MO Delphina Cahill Other Clinician: Referring Provider: Treating Provider/Extender: Linton Ham MO RRISO [name] Weeks in Treatment: 24 Notes wound exam: some improvement in depth. macerated senenscent tissue around the wound. surface debridement. candidal rash Electronic Signature(s) Signed: 06/26/2022 4:47:23 PM By: Linton Ham MD Entered By: Linton Ham on 06/26/2022 15:10:52 -------------------------------------------------------------------------------- Physician Orders  Details Patient Name: Date of Service: [name], [place] 06/26/2022 2:00 PM Medical Record Number: [id number] Patient Account Number: [id number] Date of Birth/Sex: Treating RN: 13-Apr-1966 (56 y.o. [name] Primary Care Provider: MO Delphina Cahill Other Clinician: Referring Provider: Treating Provider/Extender: Linton Ham MO RRISO [name] Weeks in Treatment: 24 Verbal / Phone Orders: No Diagnosis Coding ICD-10 Coding Code Description L89.154 Pressure ulcer of sacral region, stage 4 Z89.512 Acquired absence of left leg below knee Z89.511 Acquired absence of right leg below knee Follow-up Appointments ppointment in 1 week. - Dr. Celine Ahr RM 1 with Vaughan Basta Return A Anesthetic Wound #1 Sacrum (In clinic) Topical Lidocaine 5% applied to wound bed Bathing/ Shower/ Hygiene May shower with protection but do not get wound dressing(s) wet. Off-Loading Gel mattress overlay (Group 1) Turn and reposition every 2 hours Wound Treatment Wound #1 - Sacrum Peri-Wound Care: Zinc Oxide Ointment 30g tube 3 x Per Week/30 Days Discharge Instructions: Apply Zinc Oxide to macerated periwound with each dressing change as needed Peri-Wound Care: Lotrisone 3 x Per Week/30 Days Discharge Instructions: Apply Lotrisone to buttock rash with dressing changes Topical: [facility] antibiotic compound 3 x Per Week/30 Days Discharge Instructions: in base of wound under gauze Prim Dressing: Medline Woven Gauze Sponges 4x4 (in/in) 3 x Per Week/30 Days ary Discharge Instructions: pack into wound, moisten with saline and [facility] compound, smaller dressing over wound to hold packing in wouind Secondary Dressing: MPM Excel SAP Bordered Dressing, 7x6.7 (Sacral) (in/in) 3 x Per Week/30 Days Discharge Instructions: Apply silicone border over primary dressing as directed. ZYMERE, PATLAN ([id number]) [id number] Page 4 of 7 Electronic Signature(s) Signed: 06/26/2022 4:47:23 PM By:  Linton Ham MD Signed: 06/26/2022 6:05:04 PM By: Baruch Gouty RN, BSN Entered By: Baruch Gouty on 06/26/2022 14:23:01 -------------------------------------------------------------------------------- Problem List Details Patient Name: Date of Service: [name], [place]  06/26/2022 2:00 PM Medical Record Number: [id number] Patient Account Number: [id number] Date of Birth/Sex: Treating RN: Oct 30, 1965 (56 y.o. [name] Primary Care Provider: MO Delphina Cahill Other Clinician: Referring Provider: Treating Provider/Extender: Linton Ham MO RRISO [name] Weeks in Treatment: 24 Active Problems ICD-10 Encounter Code Description Active Date MDM Diagnosis L89.154 Pressure ulcer of sacral region, stage 4 01/08/2022 No Yes Z89.512 Acquired absence of left leg below knee 01/08/2022 No Yes Z89.511 Acquired absence of right leg below knee 01/08/2022 No Yes Inactive Problems Resolved Problems Electronic Signature(s) Signed: 06/26/2022 4:47:23 PM By: Linton Ham MD Entered By: Linton Ham on 06/26/2022 15:06:23 -------------------------------------------------------------------------------- Progress Note Details Patient Name: Date of Service: [name], RO [place] 06/26/2022 2:00 PM Medical Record Number: [id number] Patient Account Number: [id number] Date of Birth/Sex: Treating RN: 1965-11-07 (56 y.o. M) Primary Care Provider: MO Delphina Cahill Other Clinician: Referring Provider: Treating Provider/Extender: Linton Ham MO RRISO [name] Weeks in Treatment: 24 Subjective History of Present Illness (HPI) ADMISSION 01/08/2022 This is a 56 year old male with a past medical history notable for type 2 diabetes mellitus (last A1c was 8.6) congestive heart failure, hypertension, chronic pain, and bilateral below-knee amputations. His most recent amputation was in November 2022. While he was in the hospital, he developed a sacral pressure ulcer. He was subsequently in a  skilled nursing facility for some time. He was discharged with home health and had been in a wound VAC, but was then admitted to the hospital last week when the wound appeared to be worsening. Apparently the periwound skin was macerated and the device that he had been using was leaking quite a bit. Evaluation while in the hospital included a consultation with infectious disease who did not think he had any evidence of AKILI, CORSETTI ([id number]) [id number] Page 5 of 7 osteomyelitis, plastic surgery who felt that he was not an appropriate flap candidate (their note also states that he is not interested in a flap) and wound care who took him out of the wound VAC and initiated wet-to-dry dressing changes. He has a new wound VAC from [facility] on order, anticipated delivery today. The wound itself is fairly small and isolated to the sacrum. There is muscle exposed. No bone is appreciated but it is palpable beneath the surface. The muscle itself is bit pale and there is heaped up epibole around the wound edges. No significant odor or drainage. 01/14/2022: His wound VAC was not initiated until this past Friday. He has not had any issues with the VAC but today we found that the bridge foam was applied directly to the skin rather than over a layer of adhesive drape. His home health nurse also requested that we consider applying silver collagen to the wound bed in addition to the VAC. There is still a little bit of heaped up senescent skin around the wound periphery. No significant change to the wound dimensions. 01/21/2022: No significant change to the wound dimensions. The senescent skin has not reaccumulated. The periwound skin remains a bit macerated but without any obvious breakdown. The wound surface itself has a shiny appearance with a little bit of slough accumulation; no true robust granulation tissue at this time. 01/28/2022: The wound is about the same size but a little bit  shallower. There is a little bit of senescent skin reaccumulation at the cranial aspect. The periwound skin is red but not macerated and without any tissue breakdown. The wound still does not have the most robust surface.  There is a bit of slough accumulation. 02/04/2022: The wound is a bit smaller and the undermining has come in somewhat. There continues to be granulation tissue formation within the wound bed. No significant slough accumulation and his periwound skin is in better condition. 02/18/2022: The wound stinks today. There is no obvious pus but the drainage and wound itself are malodorous. He continues to have heaped up tissue within the wound bed that is rather grayish and not particularly robust-appearing. He is very angry today with his situation. 02/25/2022: Last week, in response to the odor coming from the wound, I took a culture and prescribed topical gentamicin as well as oral cefdinir. Apparently [facility] contacted him about a topical compounded antibiotic, but he did not realize this and he hung up on them. Today, the odor is no longer present. There is some senescent skin around the wound margins as well as continued heaped up granulation tissue in the center of the wound. The undermining continues to contract. 03/04/2022: The wound continues to contract and look better. He still has heaped up hypertrophic granulation tissue near the orifice. No odor was coming from the wound today. He is awaiting [place] delivery. 03/18/2022: 2-week follow-up. [facility] topical antibiotic has been initiated. The chemical cauterization of the hypertrophic granulation tissue was quite successful and the surface is much flatter today. He has heaped up senescent skin around the perimeter. His home health providers have figured out a way to keep the wound VAC from losing suction by bolstering with DuoDERM. Overall there has been significant improvement since our last visit. 04/01/2022: 2-week follow-up. The  wound continues to contract. Once again, there is heaped up senescent skin around the perimeter. He has a little bit of skin breakdown in the distribution of the adhesive drape from the wound VAC. No odor or purulent drainage. No concern for infection. 04/15/2022: 2-week follow-up. He has developed a fairly substantial rash from the drape adhesive for his wound VAC. The periwound has a lot of heaped up epiboly at the margins. The tissue in the wound bed is a little bit pale but there is no odor or purulent drainage. 04/22/2022: Last week we discontinued the VAC. Today, the skin around his wound is in significantly improved condition. His rash is resolving. He does have some heaped up senescent skin around the wound margin and a layer of slough on the wound surface, but there is no concern for active infection. 04/29/2022: The wound continues to contract. The surface is clean. He continues to build up senescent skin around the wound edges that subsequently get a little bit macerated. No concern for infection. 05/06/2022: The wound is smaller again today in all dimensions. The surface is clean. He has his usual accumulation of macerated senescent skin around the wound edges. 05/13/2022: Continued wound contracture. The undermining has decreased quite a bit. Clean surface. Senescent skin heaped up around the wound edges, as per usual. 05/22/2022: The macerated senescent skin has accumulated once again. The wound dimensions are slightly smaller and the surface is clean. He has been having difficulty keeping the packing in the wound due to the large foam border dressing that he prefers. 06/03/2022: The wound is smaller and shallower with less undermining. He has built up macerated senescent skin around the margins, as per usual. He and his friend have figured out a way to make sure that his packing stays in the wound with his [place]. 06/12/2022: The wound is a little bit shallower again today. He still has  accumulated senescent periwound skin, but otherwise the wound is clean. He will be undergoing bilateral recurrent inguinal hernia repair and umbilical hernia repair next week. 10/20 sacral pressure ulcer. using [facility] and backing wet to dry. Has developed a rash sur rounding the wound Objective Constitutional Vitals Time Taken: 2:02 PM, Temperature: 97.6 F, Pulse: 112 bpm, Respiratory Rate: 18 breaths/min, Blood Pressure: 163/106 mmHg, Capillary Blood Glucose: 128 mg/dl. General Notes: glucose per pt report this am Integumentary (Hair, Skin) Wound #1 status is Open. Original cause of wound was Pressure Injury. The date acquired was: 07/29/2021. The wound has been in treatment 24 weeks. The wound is located on the Sacrum. The wound measures 1.2cm length x 1.3cm width x 1.4cm depth; 1.225cm^2 area and 1.715cm^3 volume. There is Fat Layer (Subcutaneous Tissue) exposed. There is no tunneling noted, however, there is undermining starting at 12:00 and ending at 6:00 with a maximum distance of 1.1cm. There is a medium amount of serosanguineous drainage noted. The wound margin is epibole. There is large (67-100%) red, pink granulation within the wound bed. There is a small (1-33%) amount of necrotic tissue within the wound bed including Adherent Slough. The periwound skin appearance had no abnormalities noted for color. The periwound skin appearance exhibited: Callus, Maceration. The periwound skin appearance did not exhibit: Dry/Scaly. Periwound temperature was noted as No Abnormality. TROYCE, GIESKE ([id number]) [id number] Page 6 of 7 Assessment Active Problems ICD-10 Pressure ulcer of sacral region, stage 4 Acquired absence of left leg below knee Acquired absence of right leg below knee Procedures Wound #1 Pre-procedure diagnosis of Wound #1 is a Pressure Ulcer located on the Sacrum . There was a Selective/Open Wound Skin/Epidermis Debridement with a total area  of 1.56 sq cm performed by Ricard Dillon., MD. With the following instrument(s): Curette to remove Non-Viable tissue/material. Material removed includes Callus and Skin: Epidermis and after achieving pain control using Lidocaine 5% topical ointment. No specimens were taken. A time out was conducted at 14:15, prior to the start of the procedure. A Minimum amount of bleeding was controlled with Pressure. The procedure was tolerated well with a pain level of 0 throughout and a pain level of 0 following the procedure. Post Debridement Measurements: 1.2cm length x 1.3cm width x 1.1cm depth; 1.348cm^3 volume. Post debridement Stage noted as Category/Stage IV. Character of Wound/Ulcer Post Debridement is improved. Post procedure Diagnosis Wound #1: Same as Pre-Procedure General Notes: scribed by Baruch Gouty, RN for Dr. Dellia Nims. Plan Follow-up Appointments: Return Appointment in 1 week. - Dr. Celine Ahr RM 1 with Vaughan Basta Anesthetic: Wound #1 Sacrum: (In clinic) Topical Lidocaine 5% applied to wound bed Bathing/ Shower/ Hygiene: May shower with protection but do not get wound dressing(s) wet. Off-Loading: Gel mattress overlay (Group 1) Turn and reposition every 2 hours WOUND #1: - Sacrum Wound Laterality: Peri-Wound Care: Zinc Oxide Ointment 30g tube 3 x Per Week/30 Days Discharge Instructions: Apply Zinc Oxide to macerated periwound with each dressing change as needed Peri-Wound Care: Lotrisone 3 x Per Week/30 Days Discharge Instructions: Apply Lotrisone to buttock rash with dressing changes Topical: [facility] antibiotic compound 3 x Per Week/30 Days Discharge Instructions: in base of wound under gauze Prim Dressing: Medline Woven Gauze Sponges 4x4 (in/in) 3 x Per Week/30 Days ary Discharge Instructions: pack into wound, moisten with saline and [facility] compound, smaller dressing over wound to hold packing in wouind Secondary Dressing: MPM Excel SAP Bordered Dressing, 7x6.7 (Sacral) (in/in) 3 x  Per Week/30 Days Discharge Instructions: Apply silicone border over primary  dressing as directed. no change in primary dressing otc clotrimoxazole to rash Electronic Signature(s) Signed: 06/26/2022 4:47:23 PM By: Linton Ham MD Entered By: Linton Ham on 06/26/2022 15:11:45 -------------------------------------------------------------------------------- SuperBill Details Patient Name: Date of Service: [name], [name] 06/26/2022 Medical Record Number: [id number] Patient Account Number: [id number] Date of Birth/Sex: Treating RN: 04-Aug-1966 (56 y.o. 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

## 2022-06-26 NOTE — Progress Notes (Signed)
Lab Results  Component Value Date   TESTOSTERONE 556.94 06/23/2022    Lab Results  Component Value Date/Time   TESTOSTERONE 556.94 06/23/2022 02:59 PM   TESTOSTERONE 94 (L) 12/05/2021 11:58 AM   TESTOSTERONE 195 (L) 08/05/2021 02:14 AM   Ordered repeat standing for 8-10 am he is not on supplement suspect falsely normal.

## 2022-06-26 NOTE — Telephone Encounter (Signed)
..  Type of form received: POC   Additional comments:   Received by: [facility]  Form should be Faxed DZ:[phone]  Form should be mailed to:  na   Is patient requesting call for pickup: no   Form placed:  dr morrisons folder   Attach charge sheet.  Yes   Individual made aware of 3-5 business day turn around (Y/N)?  No

## 2022-06-26 NOTE — Progress Notes (Signed)
ICHOLAS, [name] ([id number]) [id number] Page 1 of 8 Visit Report for 06/26/2022 Arrival Information Details Patient Name: Date of Service: [place], [place] 06/26/2022 2:00 PM Medical Record Number: [id number] Patient Account Number: [id number] Date of Birth/Sex: Treating RN: [birth year] (56 y.o. [name] Primary Care Matalyn Nawaz: [place] Delphina Cahill Other Clinician: Referring Baylei Siebels: Treating Cicley Ganesh/Extender: Linton Ham [place] RRISO Suzette Battiest in Treatment: 24 Visit Information History Since Last Visit Added or deleted any medications: No Patient Arrived: Wheel Chair Any new allergies or adverse reactions: No Arrival Time: 14:01 Had a fall or experienced change in No Accompanied By: friend activities of daily living that may affect Transfer Assistance: None risk of falls: Patient Identification Verified: Yes Signs or symptoms of abuse/neglect since last visito No Secondary Verification Process Completed: Yes Hospitalized since last visit: No Patient Requires Transmission-Based Precautions: No Implantable device outside of the clinic excluding No Patient Has Alerts: No cellular tissue based products placed in the center since last visit: Has Dressing in Place as Prescribed: Yes Pain Present Now: Yes Electronic Signature(s) Signed: 06/26/2022 6:05:04 PM By: Baruch Gouty RN, BSN Entered By: Baruch Gouty on 06/26/2022 14:01:54 -------------------------------------------------------------------------------- Encounter Discharge Information Details Patient Name: Date of Service: [name], [name] 06/26/2022 2:00 PM Medical Record Number: [id number] Patient Account Number: [id number] Date of Birth/Sex: Treating RN: 11-29-1965 (56 y.o. [name] Primary Care Hanif Radin: [place] Delphina Cahill Other Clinician: Referring Raychel Dowler: Treating Carroll Ranney/Extender: Linton Ham [place] RRISO [name] Weeks in Treatment: 24 Encounter Discharge  Information Items Post Procedure Vitals Discharge Condition: Stable Temperature (F): 97.6 Ambulatory Status: Wheelchair Pulse (bpm): 112 Discharge Destination: Home Respiratory Rate (breaths/min): 18 Transportation: Private Auto Blood Pressure (mmHg): 163/106 Accompanied By: friend Schedule Follow-up Appointment: Yes Clinical Summary of Care: Patient Declined Electronic Signature(s) Signed: 06/26/2022 6:05:04 PM By: Baruch Gouty RN, BSN Entered By: Baruch Gouty on 06/26/2022 14:38:22 [name] ([id number]) [id number] Page 2 of 8 -------------------------------------------------------------------------------- Lower Extremity Assessment Details Patient Name: Date of Service: [name], [name] 06/26/2022 2:00 PM Medical Record Number: [id number] Patient Account Number: [id number] Date of Birth/Sex: Treating RN: 1965/11/01 (56 y.o. [name] Primary Care Zavian Slowey: [place] Delphina Cahill Other Clinician: Referring Cedar Ditullio: Treating Kymorah Korf/Extender: Linton Ham [place] RRISO [name] Weeks in Treatment: 24 Electronic Signature(s) Signed: 06/26/2022 6:05:04 PM By: Baruch Gouty RN, BSN Entered By: Baruch Gouty on 06/26/2022 14:03:40 -------------------------------------------------------------------------------- Multi Wound Chart Details Patient Name: Date of Service: [name], [name] 06/26/2022 2:00 PM Medical Record Number: [id number] Patient Account Number: [id number] Date of Birth/Sex: Treating RN: 05/23/1966 (56 y.o. M) Primary Care Fardeen Steinberger: [place] RRISO [name] Other Clinician: Referring Tiyana Galla: Treating Markise Haymer/Extender: Linton Ham [place] RRISO [name] Weeks in Treatment: 24 Vital Signs Height(in): Capillary Blood Glucose(mg/dl): 128 Weight(lbs): Pulse(bpm): 112 Body Mass Index(BMI): Blood Pressure(mmHg): 163/106 Temperature(F): 97.6 Respiratory Rate(breaths/min): 18 [1:Photos:] [N/A:N/A] Sacrum N/A N/A Wound  Location: Pressure Injury N/A N/A Wounding Event: Pressure Ulcer N/A N/A Primary Etiology: Congestive Heart Failure, N/A N/A Comorbid History: Hypertension, Type II Diabetes, Osteomyelitis, Confinement Anxiety 07/29/2021 N/A N/A Date Acquired: 24 N/A N/A Weeks of Treatment: Open N/A N/A Wound Status: No N/A N/A Wound Recurrence: 1.2x1.3x1.4 N/A N/A Measurements L x W x D (cm) 1.225 N/A N/A A (cm) : rea 1.715 N/A N/A Volume (cm) : 79.20% N/A N/A % Reduction in A rea: 73.50% N/A N/A % Reduction in Volume: 12 Starting Position 1 (o'clock): 6 Ending Position 1 (o'clock): 1.1 Maximum Distance  1 (cm): Yes N/A N/A Undermining: Category/Stage IV N/A N/A Classification: Medium N/A N/A Exudate A mount: Serosanguineous N/A N/A Exudate TypeELIAM, SNAPP ([id number]) [id number] Page 3 of 8 red, brown N/A N/A Exudate Color: Epibole N/A N/A Wound Margin: Large (67-100%) N/A N/A Granulation Amount: Red, Pink N/A N/A Granulation Quality: Small (1-33%) N/A N/A Necrotic Amount: Fat Layer (Subcutaneous Tissue): Yes N/A N/A Exposed Structures: Fascia: No Tendon: No Muscle: No Joint: No Bone: No Small (1-33%) N/A N/A Epithelialization: Debridement - Selective/Open Wound N/A N/A Debridement: Pre-procedure Verification/Time Out 14:15 N/A N/A Taken: Lidocaine 5% topical ointment N/A N/A Pain Control: Callus N/A N/A Tissue Debrided: Skin/Epidermis N/A N/A Level: 1.56 N/A N/A Debridement A (sq cm): rea Curette N/A N/A Instrument: Minimum N/A N/A Bleeding: Pressure N/A N/A Hemostasis A chieved: 0 N/A N/A Procedural Pain: 0 N/A N/A Post Procedural Pain: Procedure was tolerated well N/A N/A Debridement Treatment Response: 1.2x1.3x1.1 N/A N/A Post Debridement Measurements L x W x D (cm) 1.348 N/A N/A Post Debridement Volume: (cm) Category/Stage IV N/A N/A Post Debridement Stage: Callus: Yes N/A N/A Periwound Skin  Texture: Maceration: Yes N/A N/A Periwound Skin Moisture: Dry/Scaly: No No Abnormalities Noted N/A N/A Periwound Skin Color: No Abnormality N/A N/A Temperature: Debridement N/A N/A Procedures Performed: Treatment Notes Wound #1 (Sacrum) Cleanser Peri-Wound Care Lotrisone Discharge Instruction: Apply Lotrisone to buttock rash with dressing changes Topical Keystone antibiotic compound Discharge Instruction: in base of wound under gauze Primary Dressing Medline Woven Gauze Sponges 4x4 (in/in) Discharge Instruction: pack into wound, moisten with saline and Keystone compound, smaller dressing over wound to hold packing in wouind Secondary Dressing ABD Pad, 8x10 Discharge Instruction: Apply over primary dressing as directed. MPM Excel SAP Bordered Dressing, 7x6.7 (Sacral) (in/in) Discharge Instruction: Apply silicone border over primary dressing as directed. Secured With 3M Medipore H Soft Cloth Surgical T ape, 4 x 10 (in/yd) Discharge Instruction: Secure with tape as directed. Compression Wrap Compression Stockings Add-Ons Electronic Signature(s) Signed: 06/26/2022 4:47:23 PM By: Linton Ham MD Entered By: Linton Ham on 06/26/2022 15:06:29 [name] ([id number]) [id number] Page 4 of 8 -------------------------------------------------------------------------------- Multi-Disciplinary Care Plan Details Patient Name: Date of Service: [place], [place] 06/26/2022 2:00 PM Medical Record Number: [id number] Patient Account Number: [id number] Date of Birth/Sex: Treating RN: 05-15-1966 (56 y.o. [name] Primary Care Venisa Frampton: [place] Delphina Cahill Other Clinician: Referring Brock Mokry: Treating Dilara Navarrete/Extender: Linton Ham [place] RRISO Suzette Battiest in Treatment: Bl[place] reviewed with physician Active Inactive Nutrition Nursing Diagnoses: Impaired glucose control: actual or potential Potential for alteratiion  in Nutrition/Potential for imbalanced nutrition Goals: Patient/caregiver will maintain therapeutic glucose control Date Initiated: 01/08/2022 Target Resolution Date: 07/02/2022 Goal Status: Active Interventions: Assess HgA1c results as ordered upon admission and as needed Assess patient nutrition upon admission and as needed per policy Provide education on elevated blood sugars and impact on wound healing Treatment Activities: Dietary management education, guidance and counseling : 01/08/2022 Patient referred to Primary Care Physician for further nutritional evaluation : 01/08/2022 Notes: Pressure Nursing Diagnoses: Knowledge deficit related to causes and risk factors for pressure ulcer development Knowledge deficit related to management of pressures ulcers Potential for impaired tissue integrity related to pressure, friction, moisture, and shear Goals: Patient/caregiver will verbalize understanding of pressure ulcer management Date Initiated: 01/08/2022 Target Resolution Date: 07/02/2022 Goal Status: Active Interventions: Assess: immobility, friction, shearing, incontinence upon admission and as needed Assess offloading mechanisms upon admission and as needed Assess potential for pressure ulcer upon admission and as needed  Notes: Wound/Skin Impairment Nursing Diagnoses: Impaired tissue integrity Knowledge deficit related to ulceration/compromised skin integrity Goals: Patient/caregiver will verbalize understanding of skin care regimen Date Initiated: 01/08/2022 Target Resolution Date: 07/02/2022 Goal Status: Active Ulcer/skin breakdown will have a volume reduction of 30% by week 4 Date Initiated: 01/08/2022 Date Inactivated: 02/18/2022 Target Resolution Date: 02/05/2022 Goal Status: Unmet Unmet Reason: VAC leaking Ulcer/skin breakdown will have a volume reduction of 50% by week 8 Date Initiated: 02/18/2022 Date Inactivated: 03/04/2022 Target Resolution Date: 03/05/2022 KIYOSHI, SCHAAB  ([id number]) [id number] Page 5 of 8 Goal Status: Unmet Unmet Reason: infection Ulcer/skin breakdown will have a volume reduction of 80% by week 12 Date Initiated: 03/04/2022 Date Inactivated: 04/01/2022 Target Resolution Date: 04/02/2022 Goal Status: Unmet Unmet Reason: too much moisture Interventions: Assess patient/caregiver ability to obtain necessary supplies Assess patient/caregiver ability to perform ulcer/skin care regimen upon admission and as needed Assess ulceration(s) every visit Provide education on ulcer and skin care Treatment Activities: Skin care regimen initiated : 01/08/2022 Topical wound management initiated : 01/08/2022 Notes: Electronic Signature(s) Signed: 06/26/2022 6:05:04 PM By: Baruch Gouty RN, BSN Entered By: Baruch Gouty on 06/26/2022 14:09:44 -------------------------------------------------------------------------------- Pain Assessment Details Patient Name: Date of Service: [name], [place] 06/26/2022 2:00 PM Medical Record Number: [id number] Patient Account Number: [id number] Date of Birth/Sex: Treating RN: September 16, 1965 (56 y.o. [name] Primary Care Trini Christiansen: [place] Delphina Cahill Other Clinician: Referring Tammatha Cobb: Treating Michell Kader/Extender: Linton Ham [place] RRISO [name] Weeks in Treatment: 24 Active Problems Location of Pain Severity and Description of Pain Patient Has Paino Yes Site Locations Pain Location: Generalized Pain With Dressing Change: No Duration of the Pain. Constant / Intermittento Constant Rate the pain. Current Pain Level: 8 Worst Pain Level: 9 Character of Pain Describe the Pain: Aching Pain Management and Medication Current Pain Management: Medication: Yes Is the Current Pain Management Adequate: Adequate How does your wound impact your activities of daily livingo Sleep: Yes Bathing: No Appetite: No Relationship With Others: No Bladder Continence: No Emotions: No Bowel  Continence: No Work: No Toileting: No Drive: No NORRIN, SHREFFLER ([id number]) [id number] Page 6 of 8 Dressing: No Hobbies: No Electronic Signature(s) Signed: 06/26/2022 6:05:04 PM By: Baruch Gouty RN, BSN Entered By: Baruch Gouty on 06/26/2022 14:03:33 -------------------------------------------------------------------------------- Patient/Caregiver Education Details Patient Name: Date of Service: [name], [name] 10/20/2023andnbsp2:00 PM Medical Record Number: [id number] Patient Account Number: [id number] Date of Birth/Gender: Treating RN: Jan 08, 1966 (56 y.o. [name] Primary Care Physician: [place] Delphina Cahill Other Clinician: Referring Physician: Treating Physician/Extender: Linton Ham [place] RRISO Suzette Battiest in Treatment: 24 Education Assessment Education Provided To: Patient Education Topics Provided Pressure: Methods: Explain/Verbal Responses: Reinforcements needed, State content correctly Wound/Skin Impairment: Methods: Explain/Verbal Responses: Reinforcements needed, State content correctly Electronic Signature(s) Signed: 06/26/2022 6:05:04 PM By: Baruch Gouty RN, BSN Entered By: Baruch Gouty on 06/26/2022 14:10:24 -------------------------------------------------------------------------------- Wound Assessment Details Patient Name: Date of Service: [name], [place] 06/26/2022 2:00 PM Medical Record Number: [id number] Patient Account Number: [id number] Date of Birth/Sex: Treating RN: March 20, 1966 (56 y.o. [name] Primary Care Robbie Nangle: [place] Delphina Cahill Other Clinician: Referring Jorden Minchey: Treating Panayiotis [place]/Extender: Linton Ham [place] RRISO Lajean Manes N Weeks in Treatment: 24 Wound Status Wound Number: 1 Primary Pressure Ulcer Etiology: Wound Location: Sacrum Wound Open Wounding Event: Pressure Injury Status: Date Acquired: 07/29/2021 Comorbid Congestive Heart Failure, Hypertension, Type II  Diabetes, Weeks Of Treatment: 24 History: Osteomyelitis, Confinement Anxiety Clustered Wound: No Photos MANG, HAZELRIGG ([id number]) [id number] Page 7  of 8 Wound Measurements Length: (cm) 1.2 Width: (cm) 1.3 Depth: (cm) 1.4 Area: (cm) 1.225 Volume: (cm) 1.715 % Reduction in Area: 79.2% % Reduction in Volume: 73.5% Epithelialization: Small (1-33%) Tunneling: No Undermining: Yes Starting Position (o'clock): 12 Ending Position (o'clock): 6 Maximum Distance: (cm) 1.1 Wound Description Classification: Category/Stage IV Wound Margin: Epibole Exudate Amount: Medium Exudate Type: Serosanguineous Exudate Color: red, brown Foul Odor After Cleansing: No Slough/Fibrino Yes Wound Bed Granulation Amount: Large (67-100%) Exposed Structure Granulation Quality: Red, Pink Fascia Exposed: No Necrotic Amount: Small (1-33%) Fat Layer (Subcutaneous Tissue) Exposed: Yes Necrotic Quality: Adherent Slough Tendon Exposed: No Muscle Exposed: No Joint Exposed: No Bone Exposed: No Periwound Skin Texture Texture Color No Abnormalities Noted: No No Abnormalities Noted: Yes Callus: Yes Temperature / Pain Temperature: No Abnormality Moisture No Abnormalities Noted: No Dry / Scaly: No Maceration: Yes Treatment Notes Wound #1 (Sacrum) Cleanser Peri-Wound Care Lotrisone Discharge Instruction: Apply Lotrisone to buttock rash with dressing changes Topical Keystone antibiotic compound Discharge Instruction: in base of wound under gauze Primary Dressing Medline Woven Gauze Sponges 4x4 (in/in) Discharge Instruction: pack into wound, moisten with saline and Keystone compound, smaller dressing over wound to hold packing in wouind Secondary Dressing ABD Pad, 8x10 Discharge Instruction: Apply over primary dressing as directed. MPM Excel SAP Bordered Dressing, 7x6.7 (Sacral) (in/in) Discharge Instruction: Apply silicone border over primary dressing as  directed. MANSFIELD, DANN ([id number]) [id number] Page 8 of 8 Secured With 48M Medipore H Soft Cloth Surgical T ape, 4 x 10 (in/yd) Discharge Instruction: Secure with tape as directed. Compression Wrap Compression Stockings Add-Ons Electronic Signature(s) Signed: 06/26/2022 6:05:04 PM By: Baruch Gouty RN, BSN Entered By: Baruch Gouty on 06/26/2022 14:10:49 -------------------------------------------------------------------------------- Vitals Details Patient Name: Date of Service: [name], [name] 06/26/2022 2:00 PM Medical Record Number: [id number] Patient Account Number: [id number] Date of Birth/Sex: Treating RN: [birth year] (56 y.o. [name] Primary Care Roselia Snipe: [place] Delphina Cahill Other Clinician: Referring Aneka Fagerstrom: Treating Mellany Dinsmore/Extender: Linton Ham [place] RRISO [name] Weeks in Treatment: 24 Vital Signs Time Taken: 14:02 Temperature (F): 97.6 Pulse (bpm): 112 Respiratory Rate (breaths/min): 18 Blood Pressure (mmHg): 163/106 Capillary Blood Glucose (mg/dl): 128 Reference Range: 80 - 120 mg / dl Notes glucose per pt report this am Electronic Signature(s) Signed: 06/26/2022 6:05:04 PM By: Baruch Gouty RN, BSN Entered By: Baruch Gouty on 06/26/2022 14:02:57

## 2022-06-29 ENCOUNTER — Telehealth: Payer: Self-pay | Admitting: Internal Medicine

## 2022-06-29 ENCOUNTER — Other Ambulatory Visit: Payer: Self-pay

## 2022-06-29 DIAGNOSIS — G894 Chronic pain syndrome: Secondary | ICD-10-CM

## 2022-06-29 NOTE — Telephone Encounter (Signed)
Patient requests to be called at ph# [phone]  regarding: Patient went to [facility] on 06/21/22 -the only labs that were taken was for testosterone. Patient states he went back to [facility] on 06/26/22 and was told they did not have any lab orders in their system for Patient.  Also, Patient requests RX for PT to be faxed to [facility]-Ph# [phone].

## 2022-06-29 NOTE — Telephone Encounter (Signed)
Pt states: -Has something from endocrinology he wants PCP team to have. -He is wondering if pcp team has ordered "the pump" for him.   Pt requests: -call back asap.

## 2022-06-29 NOTE — Addendum Note (Signed)
Addended by: Joanette Gula on: 06/29/2022 10:36 AM   Modules accepted: Orders

## 2022-06-29 NOTE — Telephone Encounter (Signed)
Spoke with patient concerning this. I have corrected the labs to be collected at the [facility] lab and placed the referral for physical therapy at the [facility] (per his request). Patient informed me that he will return to the [facility] lab on 10/27.

## 2022-06-29 NOTE — Telephone Encounter (Signed)
Received these forms for patient on 10/20 (I was not here on this day).

## 2022-07-02 NOTE — Telephone Encounter (Signed)
Should this come from [facility]?

## 2022-07-03 ENCOUNTER — Encounter (HOSPITAL_BASED_OUTPATIENT_CLINIC_OR_DEPARTMENT_OTHER): Payer: Medicare HMO | Admitting: General Surgery

## 2022-07-03 ENCOUNTER — Other Ambulatory Visit (INDEPENDENT_AMBULATORY_CARE_PROVIDER_SITE_OTHER): Payer: Medicare HMO

## 2022-07-03 DIAGNOSIS — Z794 Long term (current) use of insulin: Secondary | ICD-10-CM

## 2022-07-03 DIAGNOSIS — G8929 Other chronic pain: Secondary | ICD-10-CM | POA: Diagnosis not present

## 2022-07-03 DIAGNOSIS — E1142 Type 2 diabetes mellitus with diabetic polyneuropathy: Secondary | ICD-10-CM

## 2022-07-03 DIAGNOSIS — L89154 Pressure ulcer of sacral region, stage 4: Secondary | ICD-10-CM | POA: Diagnosis not present

## 2022-07-03 DIAGNOSIS — I110 Hypertensive heart disease with heart failure: Secondary | ICD-10-CM | POA: Diagnosis not present

## 2022-07-03 DIAGNOSIS — Z89512 Acquired absence of left leg below knee: Secondary | ICD-10-CM | POA: Diagnosis not present

## 2022-07-03 DIAGNOSIS — I509 Heart failure, unspecified: Secondary | ICD-10-CM | POA: Diagnosis not present

## 2022-07-03 DIAGNOSIS — E11622 Type 2 diabetes mellitus with other skin ulcer: Secondary | ICD-10-CM | POA: Diagnosis not present

## 2022-07-03 DIAGNOSIS — R5381 Other malaise: Secondary | ICD-10-CM

## 2022-07-03 DIAGNOSIS — Z89511 Acquired absence of right leg below knee: Secondary | ICD-10-CM | POA: Diagnosis not present

## 2022-07-03 LAB — COMPREHENSIVE METABOLIC PANEL
ALT: 29 U/L (ref 0–53)
AST: 24 U/L (ref 0–37)
Albumin: 4 g/dL (ref 3.5–5.2)
Alkaline Phosphatase: 73 U/L (ref 39–117)
BUN: 30 mg/dL — ABNORMAL HIGH (ref 6–23)
CO2: 28 mEq/L (ref 19–32)
Calcium: 9.5 mg/dL (ref 8.4–10.5)
Chloride: 102 mEq/L (ref 96–112)
Creatinine, Ser: 1.12 mg/dL (ref 0.40–1.50)
GFR: 73.47 mL/min (ref >60.00)
Glucose, Bld: 195 mg/dL — ABNORMAL HIGH (ref 70–99)
Potassium: 4.3 mEq/L (ref 3.5–5.1)
Sodium: 138 mEq/L (ref 135–145)
Total Bilirubin: 0.5 mg/dL (ref 0.2–1.2)
Total Protein: 6.8 g/dL (ref 6.0–8.3)

## 2022-07-03 LAB — SEDIMENTATION RATE: Sed Rate: 25 mm/hr — ABNORMAL HIGH (ref 0–20)

## 2022-07-03 LAB — CBC
HCT: 53.3 % — ABNORMAL HIGH (ref 39.0–52.0)
Hemoglobin: 17.7 g/dL — ABNORMAL HIGH (ref 13.0–17.0)
MCHC: 33.2 g/dL (ref 30.0–36.0)
MCV: 91.8 fl (ref 78.0–100.0)
Platelets: 179 10*3/uL (ref 150.0–400.0)
RBC: 5.8 Mil/uL (ref 4.22–5.81)
RDW: 14 % (ref 11.5–15.5)
WBC: 4.6 10*3/uL (ref 4.0–10.5)

## 2022-07-03 LAB — C-REACTIVE PROTEIN: CRP: <1 mg/dL (ref 0.5–20.0)

## 2022-07-03 NOTE — Progress Notes (Signed)
[name], [name] ([id number]) [id number] Page 1 of 10 Visit Report for 07/03/2022 Chief Complaint Document Details Patient Name: Date of Service: [place], [place] 07/03/2022 2:30 PM Medical Record Number: [id number] Patient Account Number: [id number] Date of Birth/Sex: Treating RN: April 02, 1966 (56 y.o. M) Primary Care Provider: MO RRISO [name] Other Clinician: Referring Provider: Treating Provider/Extender: [name] [place] RRISO [name] Weeks in Treatment: 25 Information Obtained from: Patient Chief Complaint Patient is at the clinic for treatment of an open pressure ulcer Electronic Signature(s) Signed: 07/03/2022 2:46:42 PM By: [name] MD FACS Entered By: [name] on 07/03/2022 14:46:42 -------------------------------------------------------------------------------- Debridement Details Patient Name: Date of Service: [name], [place] 07/03/2022 2:30 PM Medical Record Number: [id number] Patient Account Number: [id number] Date of Birth/Sex: Treating RN: Oct 01, 1965 (56 y.o. M) Primary Care Provider: MO RRISO [name] Other Clinician: Referring Provider: Treating Provider/Extender: [name] MO RRISO [name] Weeks in Treatment: 25 Debridement Performed for Assessment: Wound #1 Sacrum Performed By: Physician [name], MD Debridement Type: Debridement Level of Consciousness (Pre-procedure): Awake and Alert Pre-procedure Verification/Time Out Yes - 14:39 Taken: Start Time: 14:40 Pain Control: Lidocaine 5% topical ointment T Area Debrided (L x W): otal 1.5 (cm) x 1.6 (cm) = 2.4 (cm) Tissue and other material debrided: Viable, Non-Viable, Slough, Skin: Dermis , Skin: Epidermis, Slough Level: Skin/Epidermis Debridement Description: Selective/Open Wound Instrument: Curette Bleeding: Minimum Hemostasis Achieved: Pressure Procedural Pain: 0 Post Procedural Pain: 0 Response to Treatment: Procedure was tolerated  well Level of Consciousness (Post- Awake and Alert procedure): Post Debridement Measurements of Total Wound Length: (cm) 1.5 Stage: Category/Stage IV Width: (cm) 1.6 Depth: (cm) 1.1 Volume: (cm) 2.073 Character of Wound/Ulcer Post Debridement: Requires Further Debridement Post Procedure Diagnosis Same as [name] ([id number]) [id number] Page 2 of 10 Notes Scribed for Dr. Celine Ahr by Blanche East, RN Electronic Signature(s) Signed: 07/03/2022 2:53:51 PM By: [name] MD FACS Entered By: [name] on 07/03/2022 14:53:51 -------------------------------------------------------------------------------- HPI Details Patient Name: Date of Service: [name], [place] 07/03/2022 2:30 PM Medical Record Number: [id number] Patient Account Number: [id number] Date of Birth/Sex: Treating RN: 01-02-1966 (56 y.o. M) Primary Care Provider: MO Delphina Cahill Other Clinician: Referring Provider: Treating Provider/Extender: [name] MO RRISO [name] Weeks in Treatment: 25 History of Present Illness HPI Description: ADMISSION 01/08/2022 This is a 56 year old male with a past medical history notable for type 2 diabetes mellitus (last A1c was 8.6) congestive heart failure, hypertension, chronic pain, and bilateral below-knee amputations. His most recent amputation was in November 2022. While he was in the hospital, he developed a sacral pressure ulcer. He was subsequently in a skilled nursing facility for some time. He was discharged with home health and had been in a wound VAC, but was then admitted to the hospital last week when the wound appeared to be worsening. Apparently the periwound skin was macerated and the device that he had been using was leaking quite a bit. Evaluation while in the hospital included a consultation with infectious disease who did not think he had any evidence of osteomyelitis, plastic surgery who felt that he  was not an appropriate flap candidate (their note also states that he is not interested in a flap) and wound care who took him out of the wound VAC and initiated wet-to-dry dressing changes. He has a new wound VAC from [facility] on order, anticipated delivery today. The wound itself is fairly small and isolated to the sacrum. There is muscle  exposed. No bone is appreciated but it is palpable beneath the surface. The muscle itself is bit pale and there is heaped up epibole around the wound edges. No significant odor or drainage. 01/14/2022: His wound VAC was not initiated until this past Friday. He has not had any issues with the VAC but today we found that the bridge foam was applied directly to the skin rather than over a layer of adhesive drape. His home health nurse also requested that we consider applying silver collagen to the wound bed in addition to the VAC. There is still a little bit of heaped up senescent skin around the wound periphery. No significant change to the wound dimensions. 01/21/2022: No significant change to the wound dimensions. The senescent skin has not reaccumulated. The periwound skin remains a bit macerated but without any obvious breakdown. The wound surface itself has a shiny appearance with a little bit of slough accumulation; no true robust granulation tissue at this time. 01/28/2022: The wound is about the same size but a little bit shallower. There is a little bit of senescent skin reaccumulation at the cranial aspect. The periwound skin is red but not macerated and without any tissue breakdown. The wound still does not have the most robust surface. There is a bit of slough accumulation. 02/04/2022: The wound is a bit smaller and the undermining has come in somewhat. There continues to be granulation tissue formation within the wound bed. No significant slough accumulation and his periwound skin is in better condition. 02/18/2022: The wound stinks today. There is no obvious pus  but the drainage and wound itself are malodorous. He continues to have heaped up tissue within the wound bed that is rather grayish and not particularly robust-appearing. He is very angry today with his situation. 02/25/2022: Last week, in response to the odor coming from the wound, I took a culture and prescribed topical gentamicin as well as oral cefdinir. Apparently [facility] contacted him about a topical compounded antibiotic, but he did not realize this and he hung up on them. Today, the odor is no longer present. There is some senescent skin around the wound margins as well as continued heaped up granulation tissue in the center of the wound. The undermining continues to contract. 03/04/2022: The wound continues to contract and look better. He still has heaped up hypertrophic granulation tissue near the orifice. No odor was coming from the wound today. He is awaiting [place] delivery. 03/18/2022: 2-week follow-up. [facility] topical antibiotic has been initiated. The chemical cauterization of the hypertrophic granulation tissue was quite successful and the surface is much flatter today. He has heaped up senescent skin around the perimeter. His home health providers have figured out a way to keep the wound VAC from losing suction by bolstering with DuoDERM. Overall there has been significant improvement since our last visit. 04/01/2022: 2-week follow-up. The wound continues to contract. Once again, there is heaped up senescent skin around the perimeter. He has a little bit of skin breakdown in the distribution of the adhesive drape from the wound VAC. No odor or purulent drainage. No concern for infection. 04/15/2022: 2-week follow-up. He has developed a fairly substantial rash from the drape adhesive for his wound VAC. The periwound has a lot of heaped up epiboly at the margins. The tissue in the wound bed is a little bit pale but there is no odor or purulent drainage. 04/22/2022: Last week we  discontinued the VAC. Today, the skin around his wound is in significantly  improved condition. His rash is resolving. He does have some heaped up senescent skin around the wound margin and a layer of slough on the wound surface, but there is no concern for active infection. 04/29/2022: The wound continues to contract. The surface is clean. He continues to build up senescent skin around the wound edges that subsequently get a little bit macerated. No concern for infection. 05/06/2022: The wound is smaller again today in all dimensions. The surface is clean. He has his usual accumulation of macerated senescent skin around the wound edges. AZARIEL, BANIK ([id number]) [id number] Page 3 of 10 05/13/2022: Continued wound contracture. The undermining has decreased quite a bit. Clean surface. Senescent skin heaped up around the wound edges, as per usual. 05/22/2022: The macerated senescent skin has accumulated once again. The wound dimensions are slightly smaller and the surface is clean. He has been having difficulty keeping the packing in the wound due to the large foam border dressing that he prefers. 06/03/2022: The wound is smaller and shallower with less undermining. He has built up macerated senescent skin around the margins, as per usual. He and his friend have figured out a way to make sure that his packing stays in the wound with his [place]. 06/12/2022: The wound is a little bit shallower again today. He still has accumulated senescent periwound skin, but otherwise the wound is clean. He will be undergoing bilateral recurrent inguinal hernia repair and umbilical hernia repair next week. 10/20 sacral pressure ulcer. using [facility] and backing wet to dry. Has developed a rash sur rounding the wound 07/03/2022: Continued contracture of the wound. It is very clean and there is no odor or purulent drainage. Continued buildup of senescent skin around the margin. Electronic  Signature(s) Signed: 07/03/2022 2:48:30 PM By: [name] MD FACS Entered By: [name] on 07/03/2022 14:48:29 -------------------------------------------------------------------------------- Physical Exam Details Patient Name: Date of Service: [name], [place] 07/03/2022 2:30 PM Medical Record Number: [id number] Patient Account Number: [id number] Date of Birth/Sex: Treating RN: 1965/10/21 (56 y.o. M) Primary Care Provider: MO RRISO [name] Other Clinician: Referring Provider: Treating Provider/Extender: [name] MO RRISO N, RYA N Weeks in Treatment: 25 Constitutional Slightly hypertensive. . . . No acute distress.Marland Kitchen Respiratory Normal work of breathing on room air.. Notes 07/03/2022: Continued contracture of the wound. It is very clean and there is no odor or purulent drainage. Continued buildup of senescent skin around the margin. Electronic Signature(s) Signed: 07/03/2022 2:48:59 PM By: [name] MD FACS Entered By: [name] on 07/03/2022 14:48:59 -------------------------------------------------------------------------------- Physician Orders Details Patient Name: Date of Service: [name], [place] 07/03/2022 2:30 PM Medical Record Number: [id number] Patient Account Number: [id number] Date of Birth/Sex: Treating RN: 20-Feb-1966 (56 y.o. [name] Primary Care Provider: MO Delphina Cahill Other Clinician: Referring Provider: Treating Provider/Extender: [name] MO RRISO [name] Weeks in Treatment: 25 Verbal / Phone Orders: No Diagnosis Coding ICD-10 Coding Code Description L89.154 Pressure ulcer of sacral region, stage 4 [name], [name] ([id number]) [id number] Page 4 of 10 [phone] Acquired absence of left leg below knee Z89.511 Acquired absence of right leg below knee Follow-up Appointments ppointment in 1 week. - Dr. Celine Ahr RM 1 with [name] Return A Anesthetic Wound #1 Sacrum (In  clinic) Topical Lidocaine 5% applied to wound bed Bathing/ Shower/ Hygiene May shower with protection but do not get wound dressing(s) wet. Off-Loading Gel mattress overlay (Group 1) Turn and reposition every 2 hours Wound Treatment Wound #1 - Sacrum Peri-Wound Care:  Zinc Oxide Ointment 30g tube 3 x Per Week/30 Days Discharge Instructions: Apply Zinc Oxide to macerated periwound with each dressing change as needed Peri-Wound Care: Lotrisone 3 x Per Week/30 Days Discharge Instructions: Apply Lotrisone to buttock rash with dressing changes Topical: [facility] antibiotic compound 3 x Per Week/30 Days Discharge Instructions: in base of wound under gauze Prim Dressing: Medline Woven Gauze Sponges 4x4 (in/in) 3 x Per Week/30 Days ary Discharge Instructions: pack into wound, moisten with saline and [facility] compound, smaller dressing over wound to hold packing in wouind Secondary Dressing: MPM Excel SAP Bordered Dressing, 7x6.7 (Sacral) (in/in) 3 x Per Week/30 Days Discharge Instructions: Apply silicone border over primary dressing as directed. Electronic Signature(s) Signed: 07/03/2022 3:48:38 PM By: [name] MD FACS Entered By: [name] on 07/03/2022 14:49:13 -------------------------------------------------------------------------------- Problem List Details Patient Name: Date of Service: [name], [name] 07/03/2022 2:30 PM Medical Record Number: [id number] Patient Account Number: [id number] Date of Birth/Sex: Treating RN: [birth year] (56 y.o. M) Primary Care Provider: MO Delphina Cahill Other Clinician: Referring Provider: Treating Provider/Extender: [name] MO RRISO [name] Weeks in Treatment: 25 Active Problems ICD-10 Encounter Code Description Active Date MDM Diagnosis L89.154 Pressure ulcer of sacral region, stage 4 01/08/2022 No Yes Z89.512 Acquired absence of left leg below knee 01/08/2022 No Yes Z89.511 Acquired absence of right leg below knee 01/08/2022  No Yes [name], [name] ([id number]) [id number] Page 5 of 10 Inactive Problems Resolved Problems Electronic Signature(s) Signed: 07/03/2022 2:46:24 PM By: [name] MD FACS Entered By: [name] on 07/03/2022 14:46:24 -------------------------------------------------------------------------------- Progress Note Details Patient Name: Date of Service: [name], [name] 07/03/2022 2:30 PM Medical Record Number: [id number] Patient Account Number: [id number] Date of Birth/Sex: Treating RN: Oct 16, 1965 (56 y.o. M) Primary Care Provider: MO RRISO [name] Other Clinician: Referring Provider: Treating Provider/Extender: [name] MO RRISO [name] Weeks in Treatment: 25 Subjective Chief Complaint Information obtained from Patient Patient is at the clinic for treatment of an open pressure ulcer History of Present Illness (HPI) ADMISSION 01/08/2022 This is a 56 year old male with a past medical history notable for type 2 diabetes mellitus (last A1c was 8.6) congestive heart failure, hypertension, chronic pain, and bilateral below-knee amputations. His most recent amputation was in November 2022. While he was in the hospital, he developed a sacral pressure ulcer. He was subsequently in a skilled nursing facility for some time. He was discharged with home health and had been in a wound VAC, but was then admitted to the hospital last week when the wound appeared to be worsening. Apparently the periwound skin was macerated and the device that he had been using was leaking quite a bit. Evaluation while in the hospital included a consultation with infectious disease who did not think he had any evidence of osteomyelitis, plastic surgery who felt that he was not an appropriate flap candidate (their note also states that he is not interested in a flap) and wound care who took him out of the wound VAC and initiated wet-to-dry dressing changes. He has a new  wound VAC from [facility] on order, anticipated delivery today. The wound itself is fairly small and isolated to the sacrum. There is muscle exposed. No bone is appreciated but it is palpable beneath the surface. The muscle itself is bit pale and there is heaped up epibole around the wound edges. No significant odor or drainage. 01/14/2022: His wound VAC was not initiated until this past Friday. He has not had any issues  with the [facility] but today we found that the bridge foam was applied directly to the skin rather than over a layer of adhesive drape. His home health nurse also requested that we consider applying silver collagen to the wound bed in addition to the VAC. There is still a little bit of heaped up senescent skin around the wound periphery. No significant change to the wound dimensions. 01/21/2022: No significant change to the wound dimensions. The senescent skin has not reaccumulated. The periwound skin remains a bit macerated but without any obvious breakdown. The wound surface itself has a shiny appearance with a little bit of slough accumulation; no true robust granulation tissue at this time. 01/28/2022: The wound is about the same size but a little bit shallower. There is a little bit of senescent skin reaccumulation at the cranial aspect. The periwound skin is red but not macerated and without any tissue breakdown. The wound still does not have the most robust surface. There is a bit of slough accumulation. 02/04/2022: The wound is a bit smaller and the undermining has come in somewhat. There continues to be granulation tissue formation within the wound bed. No significant slough accumulation and his periwound skin is in better condition. 02/18/2022: The wound stinks today. There is no obvious pus but the drainage and wound itself are malodorous. He continues to have heaped up tissue within the wound bed that is rather grayish and not particularly robust-appearing. He is very angry today with his  situation. 02/25/2022: Last week, in response to the odor coming from the wound, I took a culture and prescribed topical gentamicin as well as oral cefdinir. Apparently [facility] contacted him about a topical compounded antibiotic, but he did not realize this and he hung up on them. Today, the odor is no longer present. There is some senescent skin around the wound margins as well as continued heaped up granulation tissue in the center of the wound. The undermining continues to contract. 03/04/2022: The wound continues to contract and look better. He still has heaped up hypertrophic granulation tissue near the orifice. No odor was coming from the wound today. He is awaiting [place] delivery. 03/18/2022: 2-week follow-up. [facility] topical antibiotic has been initiated. The chemical cauterization of the hypertrophic granulation tissue was quite successful and the surface is much flatter today. He has heaped up senescent skin around the perimeter. His home health providers have figured out a way to keep the wound VAC from losing suction by bolstering with DuoDERM. Overall there has been significant improvement since our last visit. 04/01/2022: 2-week follow-up. The wound continues to contract. Once again, there is heaped up senescent skin around the perimeter. He has a little bit of skin breakdown in the distribution of the adhesive drape from the wound VAC. No odor or purulent drainage. No concern for infection. 04/15/2022: 2-week follow-up. He has developed a fairly substantial rash from the drape adhesive for his wound VAC. The periwound has a lot of heaped up epiboly at the margins. The tissue in the wound bed is a little bit pale but there is no odor or purulent drainage. 04/22/2022: Last week we discontinued the VAC. Today, the skin around his wound is in significantly improved condition. His rash is resolving. He does have some heaped up senescent skin around the wound margin and a layer of slough on the  wound surface, but there is no concern for active infection. [name], [name] ([id number]) [id number] Page 6 of 10 04/29/2022: The wound continues to contract. The  surface is clean. He continues to build up senescent skin around the wound edges that subsequently get a little bit macerated. No concern for infection. 05/06/2022: The wound is smaller again today in all dimensions. The surface is clean. He has his usual accumulation of macerated senescent skin around the wound edges. 05/13/2022: Continued wound contracture. The undermining has decreased quite a bit. Clean surface. Senescent skin heaped up around the wound edges, as per usual. 05/22/2022: The macerated senescent skin has accumulated once again. The wound dimensions are slightly smaller and the surface is clean. He has been having difficulty keeping the packing in the wound due to the large foam border dressing that he prefers. 06/03/2022: The wound is smaller and shallower with less undermining. He has built up macerated senescent skin around the margins, as per usual. He and his friend have figured out a way to make sure that his packing stays in the wound with his [place]. 06/12/2022: The wound is a little bit shallower again today. He still has accumulated senescent periwound skin, but otherwise the wound is clean. He will be undergoing bilateral recurrent inguinal hernia repair and umbilical hernia repair next week. 10/20 sacral pressure ulcer. using [facility] and backing wet to dry. Has developed a rash sur rounding the wound 07/03/2022: Continued contracture of the wound. It is very clean and there is no odor or purulent drainage. Continued buildup of senescent skin around the margin. Patient History Information obtained from Patient, Caregiver, Chart. Family History Cancer - Father, Heart Disease - Father,Paternal Grandparents, Hypertension - Father, No family history of Diabetes, Hereditary Spherocytosis,  Kidney Disease, Lung Disease, Seizures, Stroke, Thyroid Problems, Tuberculosis. Social History Never smoker, Marital Status - Divorced, Alcohol Use - Never, Drug Use - No History, Caffeine Use - Moderate - coffee. Medical History Eyes Denies history of Cataracts, Glaucoma, Optic Neuritis Ear/Nose/Mouth/Throat Denies history of Chronic sinus problems/congestion, Middle ear problems Cardiovascular Patient has history of Congestive Heart Failure, Hypertension Endocrine Patient has history of Type II Diabetes Denies history of Type I Diabetes Genitourinary Denies history of End Stage Renal Disease Integumentary (Skin) Denies history of History of Burn Musculoskeletal Patient has history of Osteomyelitis - S1 and coccyx Oncologic Denies history of Received Chemotherapy, Received Radiation Psychiatric Patient has history of Confinement Anxiety Denies history of Anorexia/bulimia Hospitalization/Surgery History - bil BKA. Medical A Surgical History Notes nd Respiratory pulmonary nodules Genitourinary renal mass, urinary hesitancy Musculoskeletal discitis of thoracic region Objective Constitutional Slightly hypertensive. No acute distress.. Vitals Time Taken: 2:34 PM, Temperature: 97.7 F, Pulse: 96 bpm, Respiratory Rate: 18 breaths/min, Blood Pressure: 148/99 mmHg. Respiratory Normal work of breathing on room air.. General Notes: 07/03/2022: Continued contracture of the wound. It is very clean and there is no odor or purulent drainage. Continued buildup of senescent skin around the margin. [name], [name] ([id number]) [id number] Page 7 of 10 Integumentary (Hair, Skin) Wound #1 status is Open. Original cause of wound was Pressure Injury. The date acquired was: 07/29/2021. The wound has been in treatment 25 weeks. The wound is located on the Sacrum. The wound measures 1.5cm length x 1.6cm width x 1.1cm depth; 1.885cm^2 area and 2.073cm^3 volume. There is  Fat Layer (Subcutaneous Tissue) exposed. There is a medium amount of serosanguineous drainage noted. The wound margin is epibole. There is large (67-100%) red, pink granulation within the wound bed. There is a small (1-33%) amount of necrotic tissue within the wound bed including Adherent Slough. The periwound skin appearance had no abnormalities noted for color. The periwound  skin appearance exhibited: Callus, Maceration. The periwound skin appearance did not exhibit: Dry/Scaly. Periwound temperature was noted as No Abnormality. 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 There was a Selective/Open Wound Skin/Epidermis Debridement with a total area of 2.4 sq cm performed by [name], MD. With the following instrument(s): Curette to remove Viable and Non-Viable tissue/material. Material removed includes Slough, Skin: Dermis, and Skin: Epidermis after achieving pain control using Lidocaine 5% topical ointment. A time out was conducted at 14:39, prior to the start of the procedure. A Minimum amount of bleeding was controlled with Pressure. The procedure was tolerated well with a pain level of 0 throughout and a pain level of 0 following the procedure. Post Debridement Measurements: 1.5cm length x 1.6cm width x 1.1cm depth; 2.073cm^3 volume. Post debridement Stage noted as Category/Stage IV. Character of Wound/Ulcer Post Debridement requires further debridement. Post procedure Diagnosis Wound #1: Same as Pre-Procedure General Notes: Scribed for Dr. Celine Ahr by Blanche East, RN. Plan Follow-up Appointments: Return Appointment in 1 week. - Dr. Celine Ahr RM 1 with [name] Anesthetic: Wound #1 Sacrum: (In clinic) Topical Lidocaine 5% applied to wound bed Bathing/ Shower/ Hygiene: May  shower with protection but do not get wound dressing(s) wet. Off-Loading: Gel mattress overlay (Group 1) Turn and reposition every 2 hours WOUND #1: - Sacrum Wound Laterality: Peri-Wound Care: Zinc Oxide Ointment 30g tube 3 x Per Week/30 Days Discharge Instructions: Apply Zinc Oxide to macerated periwound with each dressing change as needed Peri-Wound Care: Lotrisone 3 x Per Week/30 Days Discharge Instructions: Apply Lotrisone to buttock rash with dressing changes Topical: Keystone antibiotic compound 3 x Per Week/30 Days Discharge Instructions: in base of wound under gauze Prim Dressing: Medline Woven Gauze Sponges 4x4 (in/in) 3 x Per Week/30 Days ary Discharge Instructions: pack into wound, moisten with saline and Keystone compound, smaller dressing over wound to hold packing in wouind Secondary Dressing: MPM Excel SAP Bordered Dressing, 7x6.7 (Sacral) (in/in) 3 x Per Week/30 Days Discharge Instructions: Apply silicone border over primary dressing as directed. 07/03/2022: Continued contracture of the wound. It is very clean and there is no odor or purulent drainage. Continued buildup of senescent skin around the margin. I used a curette to debride the senescent skin from around the wound again. We will continue to use his Keystone topical antibiotic compound and pack the wound with gauze. He is nearly out of his [place]. I think we do not need to reorder it again. Once he is out of the topical antibiotics, we will switch his dressings to Dakin's-moistened gauze. The patient says that he has several bottles of Dakin's at home already. Follow-up in 1 week. Electronic Signature(s) Signed: 07/03/2022 2:54:05 PM By: [name] MD FACS Previous Signature: 07/03/2022 2:51:47 PM Version By: [name] MD FACS Entered By: [name] on 07/03/2022 14:54:04 [name] ([id number]) [id number] Page 8 of  10 -------------------------------------------------------------------------------- HxROS Details Patient Name: Date of Service: [name] NN, [place] 07/03/2022 2:30 PM Medical Record Number: [id number] Patient Account Number: [id number] Date of Birth/Sex: Treating RN: 05-Sep-1966 (56 y.o. M) Primary Care Provider: [place] RRISO [name] Other Clinician: Referring Provider: Treating Provider/Extender: [name] [place] [facility] [name] Weeks in Treatment: 25 Information Obtained From Patient Caregiver Chart Eyes Medical History: Negative for: Cataracts; Glaucoma; Optic Neuritis Ear/Nose/Mouth/Throat Medical  History: Negative for: Chronic sinus problems/congestion; Middle ear problems Respiratory Medical History: Past Medical History Notes: pulmonary nodules Cardiovascular Medical History: Positive for: Congestive Heart Failure; Hypertension Endocrine Medical History: Positive for: Type II Diabetes Negative for: Type I Diabetes Time with diabetes: since 2004 Treated with: Insulin Blood sugar tested every day: Yes Tested : 4 times per day Genitourinary Medical History: Negative for: End Stage Renal Disease Past Medical History Notes: renal mass, urinary hesitancy Integumentary (Skin) Medical History: Negative for: History of Burn Musculoskeletal Medical History: Positive for: Osteomyelitis - S1 and coccyx Past Medical History Notes: discitis of thoracic region Oncologic Medical History: Negative for: Received Chemotherapy; Received Radiation Psychiatric Medical History: Positive for: Confinement Anxiety Negative for: [name], [name] ([id number]) [id number] Page 9 of 10 Immunizations Pneumococcal Vaccine: Received Pneumococcal Vaccination: No Implantable Devices No devices added Hospitalization / Surgery History Type of Hospitalization/Surgery bil BKA Family and Social History Cancer: Yes - Father; Diabetes: No; Heart  Disease: Yes - Father,Paternal Grandparents; Hereditary Spherocytosis: No; Hypertension: Yes - Father; Kidney Disease: No; Lung Disease: No; Seizures: No; Stroke: No; Thyroid Problems: No; Tuberculosis: No; Never smoker; Marital Status - Divorced; Alcohol Use: Never; Drug Use: No History; Caffeine Use: Moderate - coffee; Financial Concerns: No; Food, Clothing or Shelter Needs: No; Support System Lacking: No; Transportation Concerns: Yes - hurts to transfer Electronic Signature(s) Signed: 07/03/2022 3:48:38 PM By: [name] MD FACS Entered By: [name] on 07/03/2022 14:48:35 -------------------------------------------------------------------------------- SuperBill Details Patient Name: Date of Service: [name], [name] 07/03/2022 Medical Record Number: [id number] Patient Account Number: [id number] Date of Birth/Sex: Treating RN: July 31, 1966 (56 y.o. 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 Facility Procedures : CPT4 Code: [id number] Description: 67619 - DEBRIDE WOUND 1ST 20 SQ CM OR < ICD-10 Diagnosis Description L89.154 Pressure ulcer of sacral region, stage 4 Modifier: Quantity: 1 Physician Procedures : CPT4 Code Description Modifier [id number] - WC PHYS LEVEL 4 - EST PT 25 ICD-10 Diagnosis Description L89.154 Pressure ulcer of sacral region, stage 4 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

## 2022-07-03 NOTE — Telephone Encounter (Signed)
Patient requests to be called at ph# [phone] to be given status of RX for pump & supplies (Dexcom G6 system).  Patient states [name] has not received information for pump and supplies.  Patient states he is waiting to receive the Dexcom G6 & supplies so that he can get it installed at Endocrinology office.

## 2022-07-03 NOTE — Progress Notes (Signed)
Multiple abnormals... that aren't really significatnly changed from prior. High hemoglobin is less high. Glucose is less high BUN always high but kidneys look a lot better than 2 weeks ago... probably kidneys still filtering out the protein that built up when  they were doing worse. ESR is up suggesting inflammation but CRP is not so its not too worrisome Recommend recheck in 1-6 months based on how you feel

## 2022-07-06 ENCOUNTER — Ambulatory Visit (INDEPENDENT_AMBULATORY_CARE_PROVIDER_SITE_OTHER): Payer: Medicare HMO | Admitting: Physical Therapy

## 2022-07-06 ENCOUNTER — Other Ambulatory Visit: Payer: Self-pay

## 2022-07-06 ENCOUNTER — Encounter: Payer: Self-pay | Admitting: Physical Therapy

## 2022-07-06 DIAGNOSIS — R2681 Unsteadiness on feet: Secondary | ICD-10-CM | POA: Diagnosis not present

## 2022-07-06 DIAGNOSIS — M6281 Muscle weakness (generalized): Secondary | ICD-10-CM | POA: Diagnosis not present

## 2022-07-06 DIAGNOSIS — R2689 Other abnormalities of gait and mobility: Secondary | ICD-10-CM | POA: Diagnosis not present

## 2022-07-06 DIAGNOSIS — M5459 Other low back pain: Secondary | ICD-10-CM

## 2022-07-06 DIAGNOSIS — Z7409 Other reduced mobility: Secondary | ICD-10-CM | POA: Diagnosis not present

## 2022-07-06 DIAGNOSIS — E1165 Type 2 diabetes mellitus with hyperglycemia: Secondary | ICD-10-CM

## 2022-07-06 MED ORDER — GLUCOSE BLOOD VI STRP: ORAL_STRIP | 12 refills | Status: DC

## 2022-07-06 NOTE — Therapy (Signed)
OUTPATIENT PHYSICAL THERAPY PROSTHETIC EVALUATION   Patient Name: Leonette Most MRN: [id number] DOB:03/22/1955, 56 y.o., male Today's Date: 06/30/2022  PCP: Loralee Pacas, MD REFERRING PROVIDER: Loralee Pacas, MD    PT End of Session - 07/06/22 1335     Visit Number 1    Number of Visits 26    Date for PT Re-Evaluation 10/01/22    Authorization Type [employer]    Authorization Time Period 2023 OOP & ded MET.    PT Start Time 1253    PT Stop Time 1334    PT Time Calculation (min) 41 min    Equipment Utilized During Treatment Gait belt    Activity Tolerance Patient tolerated treatment well;Patient limited by pain    Behavior During Therapy WFL for tasks assessed/performed              Past Medical History:  Diagnosis Date   Acute ischemic stroke ([place]) 06/18/2015   AKI (acute kidney injury) secondary to left periaortic lymphadenopathy resulting in left hydronephrosis 08/20/2012   dialysis   Arthritis    Catheter-related bloodstream infection    CHF (congestive heart failure) ([place])    Coronary artery disease    CVA (cerebral infarction) 01/2010   left sided neuropathy and some weakness   Diabetes mellitus 2006   no meds taken 01-02-20   Diabetic neuropathy (HCC)    ESRD (end stage renal disease) on dialysis ([place])    GERD (gastroesophageal reflux disease)    History of blood transfusion    Hyperlipidemia    Hypertension    Left varicocele 08/24/2012   MDD (major depressive disorder)    Migraine headache    Myocardial infarction ([place])    greater than 20 years ago   Nerve damage    back   NHL (non-Hodgkin's lymphoma) ([place]) 08/26/2012   OSA on CPAP    has not used cpap for a while   Pneumonia    Sleep apnea    no CPAP   Stroke [facility])    TIA - 06/2015   Previous stroke left him with left side weakness   Past Surgical History:  Procedure Laterality Date   APPENDECTOMY     BALLOON ANGIOPLASTY, ARTERY     BASCILIC VEIN TRANSPOSITION Left 04/25/2015    Procedure: FIRST STAGE BASILIC VEIN TRANSPOSITION;  Surgeon: Serafina Mitchell, MD;  Location: [facility];  Service: Vascular;  Laterality: Left;   [place] Left 07/25/2015   Procedure: SECOND STAGE BASILIC VEIN TRANSPOSITION;  Surgeon: Serafina Mitchell, MD;  Location: [facility];  Service: Vascular;  Laterality: Left;   CARDIAC CATHETERIZATION     CATARACT EXTRACTION, BILATERAL     COLONOSCOPY  07/2012   polyps ([facility] [place])   CYSTOSCOPY WITH STENT PLACEMENT  08/21/2012   Procedure: CYSTOSCOPY WITH STENT PLACEMENT;  Surgeon: Alexis Frock, MD;  Location: [facility];  Service: Urology;  Laterality: Left;  cystoscopy, left retrograde pyelogram, left ureteral stent placement   INSERTION OF DIALYSIS CATHETER N/A 07/31/2015   Procedure: INSERTION OF DIALYSIS CATHETER;  Surgeon: Mal Misty, MD;  Location: [facility];  Service: Vascular;  Laterality: N/A;   KNEE SURGERY     bilateral   LEG AMPUTATION Right 06/07/2021   UPPER GASTROINTESTINAL ENDOSCOPY     Patient Active Problem List   Diagnosis Date Noted   SIRS (systemic inflammatory response syndrome) ([place]) 01/07/2022   Sepsis ([place]) 01/07/2022   Acute ischemic stroke ([place]) 05/12/2021   Mixed hyperlipidemia 08/27/2020  Muscle pain, lumbar 08/27/2020   Type 2 diabetes mellitus without complication, without long-term current use of insulin (HCC)    OSA (obstructive sleep apnea) 03/04/2018   History of partial ray amputation of first toe of left foot ([place]) 03/04/2018   Erectile dysfunction 12/17/2016   ESRD on hemodialysis ([place]) 01/08/2016   Catheter-related bloodstream infection    Bacteremia    Complications, dialysis, catheter, mechanical (HCC)    Elevated troponin 10/20/2015   ESRD (end stage renal disease) on dialysis ([place]) 10/20/2015   Left-sided weakness 07/30/2015   Essential hypertension 07/30/2015   DM (diabetes mellitus), type 2 with renal complications ([place]) 71/02/2693   Hemispheric carotid artery syndrome    Stroke  (cerebrum) ([place]) 06/18/2015   Pneumonia 11/16/2014   Dyspnea 10/30/2014   Cough 10/30/2014   Encounter for therapeutic drug monitoring 10/17/2013   Stroke ([place]) 07/08/2013   Long term current use of anticoagulant therapy 03/03/2013   Chronic diastolic CHF (congestive heart failure) ([place]) 12/19/2012   Hypokalemia 12/10/2012   Anemia 11/22/2012   Hyperleukocytosis 11/14/2012   Leukocytosis 11/13/2012   Hyperkalemia 08/25/2012   Left varicocele 08/24/2012   Normocytic anemia 08/24/2012   Chronic kidney disease (CKD), stage V ([place]) 08/24/2012   Lymphadenopathy 08/20/2012   Diabetes with neurologic complications ([place]) 85/46/2703   Hypertension 09/25/2011    ONSET DATE: 06/29/2022 MD referral to PT  REFERRING DIAG: G89.4 (ICD-10-CM) - Chronic pain syndrome   Z89.512 Left Below Knee Amputation, Z89.511 history of right Below Knee Amputation.  THERAPY DIAG:  No diagnosis found.  Rationale for Evaluation and Treatment Rehabilitation  SUBJECTIVE:   SUBJECTIVE STATEMENT: Patient underwent left Transtibial Amputation 07/23/2021. He has right BKA Nov. 2017. He developed sacral pressure wound and is almost healed. He received bilateral prostheses (first left and new right) 01/26/22. He has HHPT until ~1 month ago.  He has bilateral inguinal & a umbical hernias that he need surgeries.  Pt was referred to PT to improve balance & function. Patient has been limited in ability to be out of bed to enable sacral wound to heal for almost a year so he is deconditioned.  Pt accompanied by: friend  PERTINENT HISTORY: DM2, CHF, HTN, chronic pain, bilateral TTAs  PAIN:  Are you having pain? Yes: NPRS scale: today 8/10 and in last week ranging 8/10-10/10 Pain location: middle low back Pain description: sharp Aggravating factors: extension Relieving factors: sitting forward,   PRECAUTIONS: Fall  WEIGHT BEARING RESTRICTIONS: No  FALLS: Has patient fallen in last 6 months? No  LIVING  ENVIRONMENT: Lives with: lives alone and friend staying for now Lives in: House/apartment 2nd floor apt with elevator Home Access: Elevator Home layout: One level Has following equipment at home: Single point cane, Environmental consultant - 2 wheeled, Wheelchair (manual), Electronics engineer, and Grab bars  PLOF: Independent, Independent with household mobility without device, and Independent with community mobility without device with right prosthesis  PATIENT GOALS:  To use prostheses to walk and improve balance  OBJECTIVE:  COGNITION: Overall cognitive status: Within functional limits for tasks assessed  POSTURE: rounded shoulders, forward head, flexed trunk , and weight shift left  LOWER EXTREMITY ROM:  ROM P:passive  A:active Right eval Left eval  Hip flexion    Hip extension Standing A: -15* Standing A: -15*  Hip abduction    Hip adduction    Hip internal rotation    Hip external rotation    Knee flexion    Knee extension    Ankle dorsiflexion  Ankle plantarflexion    Ankle inversion    Ankle eversion     (Blank rows = not tested)  LOWER EXTREMITY MMT:  MMT Right eval Left eval  Hip flexion 4/5 4/5  Hip extension 3-/5 3-/5  Hip abduction 3/5 3/5  Hip adduction    Hip internal rotation    Hip external rotation    Knee flexion 4/5 4/5  Knee extension 4/5 4/5  Ankle dorsiflexion NA NA  Ankle plantarflexion NA NA  Ankle inversion NA NA  Ankle eversion NA NA  (Blank rows = not tested)  TRANSFERS: Sit to stand: SBA 22" w/c requiring armrests to RW for support for stabilization with poor technique.  Stand to sit: SBA requires RW support to 22" w/c requiring armrests with poor technique.   GAIT: Gait pattern: step to pattern, decreased step length- Right, decreased stance time- Left, Left hip hike, antalgic, trunk flexed, and wide BOS Distance walked: 44' with 90* turn right & left and 180* turn Assistive device utilized: Environmental consultant - 2 wheeled and TTA prosthesis Level of  assistance: Min A esp with turns Comments: excessive BUE weight bearing on RW  FUNCTIONAL TESTs:  Berg Balance Scale: 10/56  [facility] PT Assessment - 07/06/22 1300       Standardized Balance Assessment   Standardized Balance Assessment Berg Balance Test      Berg Balance Test   Sit to Stand Needs minimal aid to stand or to stabilize    Standing Unsupported Needs several tries to stand 30 seconds unsupported    Sitting with Back Unsupported but Feet Supported on Floor or Stool Able to sit safely and securely 2 minutes    Stand to Sit Sits independently, has uncontrolled descent    Transfers Able to transfer safely, definite need of hands    Standing Unsupported with Eyes Closed Needs help to keep from falling    Standing Unsupported with Feet Together Needs help to attain position and unable to hold for 15 seconds    From Standing, Reach Forward with Outstretched Arm Loses balance while trying/requires external support    From Standing Position, Pick up Object from Floor Unable to try/needs assist to keep balance    From Standing Position, Turn to Look Behind Over each Shoulder Needs assist to keep from losing balance and falling    Turn 360 Degrees Needs assistance while turning    Standing Unsupported, Alternately Place Feet on Step/Stool Needs assistance to keep from falling or unable to try    Standing Unsupported, One Foot in [employer] balance while stepping or standing    Standing on One Leg Unable to try or needs assist to prevent fall    Total Score 10    Berg comment: BERG  < 36 high risk for falls (close to 100%) 46-51 moderate (>50%)   37-45 significant (>80%) 52-55 lower (> 25%)             CURRENT PROSTHETIC WEAR ASSESSMENT: Patient is dependent with: skin check, residual limb care, prosthetic cleaning, ply sock cleaning, correct ply sock adjustment, proper wear schedule/adjustment, and proper weight-bearing schedule/adjustment Donning prosthesis: SBA / verbal  cues Doffing prosthesis: SBA / verbal cues Prosthetic wear tolerance: liner & flexible suction socket for most of awake hours and wears prostheses maybe ~40% of awake hours,  7 days/week Prosthetic weight bearing tolerance: 5 minutes Edema: none noted Residual limb condition: bilateral redness over patella, dry skin, normal color & temperature.  Prosthetic description: suction pin suspension with  55m silicon liner as interface & flexible inner socket, total contact socket, dynamic response Flex feet    TODAY'S TREATMENT:                                                                                                                             DATE:  07/06/2022  PATIENT EDUCATION: PATIENT EDUCATED ON FOLLOWING PROSTHETIC CARE: Education details:   Skin check, Prosthetic cleaning, and [place] donning , positioning in sitting and proper sit to/from stand technique. Person educated: Patient and friend Education method: Explanation and Verbal cues Education comprehension: verbalized understanding, verbal cues required, and needs further education  HOME EXERCISE PROGRAM:  ASSESSMENT:  CLINICAL IMPRESSION: Patient is a 56y.o. male who was seen today for physical therapy evaluation and treatment for prosthetic training with bilateral transtibial amputations. He requires instruction in prosthetic care for safe & proper use. He is limited in prosthetic wear which limits functional mobility during the day. Patient has weakness & impaired flexibility from prolonged bed or w/c bound. Berg Balance test of 10/56 indicates high fall risk & dependency in standing ADLs. Patient's gait is dependent on RW for BUE support and MinA for limited distances. Patient has history of LBP that is execerbated and limiting his activity tolerance. Pt would benefit from skilled PT to improve function & safety.   OBJECTIVE IMPAIRMENTS: Abnormal gait, decreased activity tolerance, decreased balance, decreased knowledge of  use of DME, decreased mobility, decreased ROM, decreased strength, impaired flexibility, postural dysfunction, prosthetic dependency , and pain.   ACTIVITY LIMITATIONS: carrying, lifting, bending, sitting, standing, squatting, stairs, transfers, and locomotion level  PARTICIPATION LIMITATIONS: meal prep, cleaning, driving, and community activity  PERSONAL FACTORS: Fitness, Time since onset of injury/illness/exacerbation, and 3+ comorbidities: see PMH  are also affecting patient's functional outcome.   REHAB POTENTIAL: Good  CLINICAL DECISION MAKING: Evolving/moderate complexity  EVALUATION COMPLEXITY: Moderate   GOALS: Goals reviewed with patient? Yes  SHORT TERM GOALS: Target date: 08/06/2022  Patient donnes prosthesis modified independent & verbalizes proper cleaning. Baseline: SEE OBJECTIVE DATA Goal status: INITIAL 2.  Patient verbalizes understanding of initial HEP.  Baseline: SEE OBJECTIVE DATA Goal status: INITIAL  3.  Patient able to stand 60 seconds without UE support and reach 5" anteriorly with supervision.  Baseline: SEE OBJECTIVE DATA Goal status: INITIAL  4. Patient ambulates 157 with RW & prosthesis with supervision. Baseline: SEE OBJECTIVE DATA Goal status: INITIAL  5. Patient negotiates ramps & curbs with RW & prosthesis with minA. Baseline: SEE OBJECTIVE DATA Goal status: INITIAL  LONG TERM GOALS: Target date: 10/01/2022  Patient demonstrates & verbalized understanding of prosthetic care to enable safe utilization of prosthesis. Baseline: SEE OBJECTIVE DATA Goal status: INITIAL  Patient tolerates prostheses wear >90% of awake hours without skin or limb pain issues. Baseline: SEE OBJECTIVE DATA Goal status: INITIAL  Berg Balance >/= 45/56 to indicate lower fall risk Baseline: SEE OBJECTIVE DATA Goal status: INITIAL  Patient ambulates >300' with prostheses  and cane or less independently Baseline: SEE OBJECTIVE DATA Goal status: INITIAL  Patient  negotiates ramps, curbs & stairs with single rail with prostheses and cane or less independently. Baseline: SEE OBJECTIVE DATA Goal status: INITIAL  6.   Patient verbalizes & demonstrates understanding of how to properly use fitness equipment to return to gym. Baseline: SEE OBJECTIVE DATA Goal status: INITIAL  PLAN:  PT FREQUENCY: 2x/week  PT DURATION: 90 days / 13 weeks  PLANNED INTERVENTIONS: Therapeutic exercises, Therapeutic activity, Neuromuscular re-education, Balance training, Gait training, Patient/Family education, Self Care, Stair training, Prosthetic training, DME instructions, Electrical stimulation, Cryotherapy, Moist heat, and physical performance testing.  PLAN FOR NEXT SESSION: set up HEP for balance including sit to/from stand transfers.    Jamey Reas, PT, DPT 07/06/2022, 5:05 PM   Referring diagnosis? G89.4 (ICD-10-CM) - Chronic pain syndrome   Z89.512 Left Below Knee Amputation, Z89.511 history of right Below Knee Amputation. Treatment diagnosis? (if different than referring diagnosis)   Unsteadiness on feet R26.81  Change Dx        2.   Other abnormalities of gait and mobility R26.89  Change Dx     3.   Muscle weakness (generalized) M62.81  Change Dx     4.   Impaired functional mobility and activity tolerance Z74.09  Change Dx     5.   Other low back pain M54.59  Change Dx              What was this (referring dx) caused by? [x] Surgery [] Fall [] Ongoing issue [] Arthritis [] Other: ____________  Laterality: [] Rt [] Lt [x] Both  Check all possible CPT codes:  *CHOOSE 10 OR LESS*    [x] 21031 (Therapeutic Exercise)  [] 28118 (SLP Treatment)  [x] 86773 (Neuro Re-ed)   [] 73668 (Swallowing Treatment)   [x] 15947 (Gait Training)   [] [phone] (Cognitive Training, 1st 15 minutes) [] 18343 (Manual Therapy)   [] 73578 (Cognitive Training, each add'l 15 minutes)  [x] 97847 (Re-evaluation)                              [] Other, List CPT Code  ____________  [x] 84128 (Therapeutic Activities)     [x] 20813 (Self Care)   [] All codes above (97110 - 88719)  [] 59747 (Mechanical Traction)  [] 97014 (E-stim Unattended)  [x] 18550 (E-stim manual)  [] 15868 (Ionto)  [] 25749 (Ultrasound) [] 35521 (Physical Performance Training) [] [id number] (Aquatic Therapy) [] 74715 (Vasopneumatic Device) [] [id number] (Paraffin) [] 95396 (Contrast Bath) [] [id number] (Wound Care 1st 20 sq cm) [] 72897 (Wound Care each add'l 20 sq cm) [] 91504 (Orthotic Fabrication, Fitting, Training Initial) [x] [id number] (Prosthetic Management and Training Initial) [] [id number] (Orthotic or Prosthetic Training/ Modification Subsequent)

## 2022-07-06 NOTE — Progress Notes (Signed)
I have reviewed your recent results and, in my medical opinion:  This is good news.  No bacteria growing Is always good.  Loralee Pacas, MD  07/06/2022 8:21 AM

## 2022-07-06 NOTE — Telephone Encounter (Signed)
Spoke with patient, gave him the phone number for [employer] to check on the status of both. Informed him to call the office if we need to do anything else on our end.

## 2022-07-07 NOTE — Progress Notes (Signed)
NAVY, BELAY ([id number]) [id number] Page 1 of 6 Visit Report for 07/03/2022 Arrival Information Details Patient Name: Date of Service: [place], [place] 07/03/2022 2:30 PM Medical Record Number: [id number] Patient Account Number: [id number] Date of Birth/Sex: Treating RN: 05/12/1966 (56 y.o. [name] Primary Care Kenniya Westrich: [place] RRISO [name] Other Clinician: Referring Christena Sunderlin: Treating Aman Batley/Extender: [name] [place] RRISO Suzette Battiest in Treatment: 25 Visit Information History Since Last Visit Added or deleted any medications: No Patient Arrived: Ambulatory Any new allergies or adverse reactions: No Arrival Time: 14:34 Had a fall or experienced change in No Accompanied By: friend activities of daily living that may affect Transfer Assistance: None risk of falls: Patient Requires Transmission-Based Precautions: No Signs or symptoms of abuse/neglect since last visito No Patient Has Alerts: No Hospitalized since last visit: No Implantable device outside of the clinic excluding No cellular tissue based products placed in the center since last visit: Has Dressing in Place as Prescribed: Yes Pain Present Now: No Electronic Signature(s) Signed: 07/03/2022 4:25:07 PM By: Adline Peals Entered By: Adline Peals on 07/03/2022 14:34:46 -------------------------------------------------------------------------------- Lower Extremity Assessment Details Patient Name: Date of Service: [name], RO [place] 07/03/2022 2:30 PM Medical Record Number: [id number] Patient Account Number: [id number] Date of Birth/Sex: Treating RN: 1965-11-07 (56 y.o. [name] Primary Care [name]: [place] Delphina Cahill Other Clinician: Referring [name]: Treating [name]/Extender: [name] [place] RRISO [name] Weeks in Treatment: 25 Electronic Signature(s) Signed: 07/03/2022 4:25:07 PM By: Sabas Sous By: Adline Peals on 07/03/2022 14:35:36 -------------------------------------------------------------------------------- Multi Wound Chart Details Patient Name: Date of Service: [name], [name] 07/03/2022 2:30 PM Medical Record Number: [id number] Patient Account Number: [id number] Date of Birth/Sex: Treating RN: 05-10-1966 (56 y.o. M) Primary Care [name]: [place] Delphina Cahill Other Clinician: Referring [name]: Treating [name]/Extender: [name] [place] RRISO Suzette Battiest in Treatment: [address] [place] ([id number]) [id number] Page 2 of 6 Vital Signs Height(in): Pulse(bpm): 96 Weight(lbs): Blood Pressure(mmHg): 148/99 Body Mass Index(BMI): Temperature(F): 97.7 Respiratory Rate(breaths/min): 18 [1:Photos:] [N/A:N/A] Sacrum N/A N/A Wound Location: Pressure Injury N/A N/A Wounding Event: Pressure Ulcer N/A N/A Primary Etiology: Congestive Heart Failure, N/A N/A Comorbid History: Hypertension, Type II Diabetes, Osteomyelitis, Confinement Anxiety 07/29/2021 N/A N/A Date Acquired: 25 N/A N/A Weeks of Treatment: Open N/A N/A Wound Status: No N/A N/A Wound Recurrence: 1.5x1.6x1.1 N/A N/A Measurements L x W x D (cm) 1.885 N/A N/A A (cm) : rea 2.073 N/A N/A Volume (cm) : 68.00% N/A N/A % Reduction in A rea: 68.00% N/A N/A % Reduction in Volume: Category/Stage IV N/A N/A Classification: Medium N/A N/A Exudate A mount: Serosanguineous N/A N/A Exudate Type: red, brown N/A N/A Exudate Color: Epibole N/A N/A Wound Margin: Large (67-100%) N/A N/A Granulation A mount: Red, Pink N/A N/A Granulation Quality: Small (1-33%) N/A N/A Necrotic A mount: Fat Layer (Subcutaneous Tissue): Yes N/A N/A Exposed Structures: Fascia: No Tendon: No Muscle: No Joint: No Bone: No Small (1-33%) N/A N/A Epithelialization: Debridement - Excisional N/A N/A Debridement: Pre-procedure Verification/Time Out 14:39 N/A N/A Taken: Lidocaine 5% topical  ointment N/A N/A Pain Control: Subcutaneous, Slough N/A N/A Tissue Debrided: Skin/Subcutaneous Tissue N/A N/A Level: 2.4 N/A N/A Debridement A (sq cm): rea Curette N/A N/A Instrument: Minimum N/A N/A Bleeding: Pressure N/A N/A Hemostasis A chieved: 0 N/A N/A Procedural Pain: 0 N/A N/A Post Procedural Pain: Procedure was tolerated well N/A N/A Debridement Treatment Response: 1.5x1.6x1.1 N/A N/A Post Debridement Measurements L x W x D (  cm) 2.073 N/A N/A Post Debridement Volume: (cm) Category/Stage IV N/A N/A Post Debridement Stage: Callus: Yes N/A N/A Periwound Skin Texture: Maceration: Yes N/A N/A Periwound Skin Moisture: Dry/Scaly: No No Abnormalities Noted N/A N/A Periwound Skin Color: No Abnormality N/A N/A Temperature: Debridement N/A N/A Procedures Performed: Treatment Notes Electronic Signature(s) Signed: 07/03/2022 2:46:31 PM By: [name] MD FACS Entered By: [name] on 07/03/2022 14:46:31 [name] ([id number]) [id number] Page 3 of 6 -------------------------------------------------------------------------------- Multi-Disciplinary Care Plan Details Patient Name: Date of Service: [place], [place] 07/03/2022 2:30 PM Medical Record Number: [id number] Patient Account Number: [id number] Date of Birth/Sex: Treating RN: [birth year] (56 y.o. [name] Primary Care Wanette Robison: [place] RRISO [name] Other Clinician: Referring Asaiah Hunnicutt: Treating Andreu Drudge/Extender: [name] [place] RRISO Suzette Battiest in Treatment: [place] reviewed with physician Active Inactive Nutrition Nursing Diagnoses: Impaired glucose control: actual or potential Potential for alteratiion in Nutrition/Potential for imbalanced nutrition Goals: Patient/caregiver will maintain therapeutic glucose control Date Initiated: 01/08/2022 Target Resolution Date: 08/06/2022 Goal Status: Active Interventions: Assess  HgA1c results as ordered upon admission and as needed Assess patient nutrition upon admission and as needed per policy Provide education on elevated blood sugars and impact on wound healing Treatment Activities: Dietary management education, guidance and counseling : 01/08/2022 Patient referred to Primary Care Physician for further nutritional evaluation : 01/08/2022 Notes: Pressure Nursing Diagnoses: Knowledge deficit related to causes and risk factors for pressure ulcer development Knowledge deficit related to management of pressures ulcers Potential for impaired tissue integrity related to pressure, friction, moisture, and shear Goals: Patient/caregiver will verbalize understanding of pressure ulcer management Date Initiated: 01/08/2022 Target Resolution Date: 08/06/2022 Goal Status: Active Interventions: Assess: immobility, friction, shearing, incontinence upon admission and as needed Assess offloading mechanisms upon admission and as needed Assess potential for pressure ulcer upon admission and as needed Notes: Wound/Skin Impairment Nursing Diagnoses: Impaired tissue integrity Knowledge deficit related to ulceration/compromised skin integrity Goals: Patient/caregiver will verbalize understanding of skin care regimen Date Initiated: 01/08/2022 Target Resolution Date: 08/06/2022 Goal Status: Active Ulcer/skin breakdown will have a volume reduction of 30% by week 4 Date Initiated: 01/08/2022 Date Inactivated: 02/18/2022 Target Resolution Date: 02/05/2022 Goal Status: Unmet Unmet Reason: VAC leaking Ulcer/skin breakdown will have a volume reduction of 50% by week [place], [place] ([id number]) [id number] Page 4 of 6 Date Initiated: 02/18/2022 Date Inactivated: 03/04/2022 Target Resolution Date: 03/05/2022 Goal Status: Unmet Unmet Reason: infection Ulcer/skin breakdown will have a volume reduction of 80% by week 12 Date Initiated: 03/04/2022 Date Inactivated:  04/01/2022 Target Resolution Date: 04/02/2022 Goal Status: Unmet Unmet Reason: too much moisture Interventions: Assess patient/caregiver ability to obtain necessary supplies Assess patient/caregiver ability to perform ulcer/skin care regimen upon admission and as needed Assess ulceration(s) every visit Provide education on ulcer and skin care Treatment Activities: Skin care regimen initiated : 01/08/2022 Topical wound management initiated : 01/08/2022 Notes: Electronic Signature(s) Signed: 07/03/2022 4:25:07 PM By: Adline Peals Entered By: Adline Peals on 07/03/2022 14:36:20 -------------------------------------------------------------------------------- Pain Assessment Details Patient Name: Date of Service: [name], [place] 07/03/2022 2:30 PM Medical Record Number: [id number] Patient Account Number: [id number] Date of Birth/Sex: Treating RN: 02/08/1966 (56 y.o. [name] Primary Care Clarity Ciszek: [place] Delphina Cahill Other Clinician: Referring Jonty Morrical: Treating Sharmeka Palmisano/Extender: [name] [place] RRISO [name] Weeks in Treatment: 25 Active Problems Location of Pain Severity and Description of Pain Patient Has Paino Yes Site Locations Pain Location: Generalized Pain Rate the pain. Current Pain Level: 7 Pain  Management and Medication Current Pain Management: Electronic Signature(s) Signed: 07/03/2022 4:25:07 PM By: Adline Peals Entered By: Adline Peals on 07/03/2022 14:35:29 [name] ([id number]) [id number] Page 5 of 6 -------------------------------------------------------------------------------- Patient/Caregiver Education Details Patient Name: Date of Service: [name] 10/27/2023andnbsp2:30 PM Medical Record Number: [id number] Patient Account Number: [id number] Date of Birth/Gender: Treating RN: [birth year] (56 y.o. [name] Primary Care Physician: [place] Delphina Cahill Other  Clinician: Referring Physician: Treating Physician/Extender: [name] [place] RRISO Suzette Battiest in Treatment: 25 Education Assessment Education Provided To: Patient Education Topics Provided Wound/Skin Impairment: Methods: Explain/Verbal Responses: Reinforcements needed, State content correctly Electronic Signature(s) Signed: 07/03/2022 4:25:07 PM By: Adline Peals Entered By: Adline Peals on 07/03/2022 14:36:33 -------------------------------------------------------------------------------- Wound Assessment Details Patient Name: Date of Service: [name], [place] 07/03/2022 2:30 PM Medical Record Number: [id number] Patient Account Number: [id number] Date of Birth/Sex: Treating RN: 1966/04/25 (56 y.o. M) Primary Care Diera Wirkkala: [place] RRISO [name] Other Clinician: Referring Caprina Wussow: Treating Jatavian Calica/Extender: [name] [place] RRISO N, RYA N Weeks in Treatment: 25 Wound Status Wound Number: 1 Primary Pressure Ulcer Etiology: Wound Location: Sacrum Wound Open Wounding Event: Pressure Injury Status: Date Acquired: 07/29/2021 Comorbid Congestive Heart Failure, Hypertension, Type II Diabetes, Weeks Of Treatment: 25 History: Osteomyelitis, Confinement Anxiety Clustered Wound: No Photos Wound Measurements Length: (cm) 1.5 Width: (cm) 1.6 Depth: (cm) 1.1 Area: (cm) 1. Volume: (cm) 2. Mcdowell, [place] ([id number]) % Reduction in Area: 68% % Reduction in Volume: 68% Epithelialization: Small (1-33%) 885 Tunneling: No 073 Undermining: Yes [id number] Page 6 of 6 Starting Position (o'clock): 12 Ending Position (o'clock): 6 Maximum Distance: (cm) 1 Wound Description Classification: Category/Stage IV Wound Margin: Epibole Exudate Amount: Medium Exudate Type: Serosanguineous Exudate Color: red, brown Foul Odor After Cleansing: No Slough/Fibrino Yes Wound Bed Granulation Amount: Large (67-100%) Exposed Structure Granulation  Quality: Red, Pink Fascia Exposed: No Necrotic Amount: Small (1-33%) Fat Layer (Subcutaneous Tissue) Exposed: Yes Necrotic Quality: Adherent Slough Tendon Exposed: No Muscle Exposed: No Joint Exposed: No Bone Exposed: No Periwound Skin Texture Texture Color No Abnormalities Noted: No No Abnormalities Noted: Yes Callus: Yes Temperature / Pain Rash: Yes Temperature: No Abnormality Moisture No Abnormalities Noted: No Dry / Scaly: No Maceration: Yes Electronic Signature(s) Signed: 07/07/2022 4:16:20 PM By: Blanche East RN Entered By: Blanche East on 07/03/2022 15:50:38 -------------------------------------------------------------------------------- Vitals Details Patient Name: Date of Service: [name], RO [place] 07/03/2022 2:30 PM Medical Record Number: [id number] Patient Account Number: [id number] Date of Birth/Sex: Treating RN: Nov 28, 1965 (56 y.o. [name] Primary Care Nicanor Mend[place]: [place] Delphina Cahill Other Clinician: Referring Jamyria Ozanich: Treating Chela Sutphen/Extender: [name] [place] RRISO [name] Weeks in Treatment: 25 Vital Signs Time Taken: 14:34 Temperature (F): 97.7 Pulse (bpm): 96 Respiratory Rate (breaths/min): 18 Blood Pressure (mmHg): 148/99 Reference Range: 80 - 120 mg / dl Electronic Signature(s) Signed: 07/03/2022 4:25:07 PM By: Adline Peals Entered By: Adline Peals on 07/03/2022 14:35:19

## 2022-07-07 NOTE — Telephone Encounter (Signed)
Patient states: - Order for pump needs to be sent to [facility] and not [place] has no record of this pump being needed and in turn can't pay for it unless this gets corrected

## 2022-07-08 ENCOUNTER — Encounter: Payer: Self-pay | Admitting: Physical Therapy

## 2022-07-08 ENCOUNTER — Ambulatory Visit (INDEPENDENT_AMBULATORY_CARE_PROVIDER_SITE_OTHER): Payer: Medicare HMO | Admitting: Physical Therapy

## 2022-07-08 DIAGNOSIS — M5459 Other low back pain: Secondary | ICD-10-CM

## 2022-07-08 DIAGNOSIS — R2689 Other abnormalities of gait and mobility: Secondary | ICD-10-CM

## 2022-07-08 DIAGNOSIS — M6281 Muscle weakness (generalized): Secondary | ICD-10-CM

## 2022-07-08 DIAGNOSIS — R2681 Unsteadiness on feet: Secondary | ICD-10-CM | POA: Diagnosis not present

## 2022-07-08 LAB — CULTURE, BLOOD (SINGLE)
MICRO NUMBER:: 14111039
Result:: NO GROWTH
SPECIMEN QUALITY:: ADEQUATE

## 2022-07-08 NOTE — Therapy (Addendum)
OUTPATIENT PHYSICAL THERAPY TREATMENT NOTE   Patient Name: [name] MRN: [id number] DOB:June 24, 1966, 56 y.o., male Today's Date: 07/08/2022  PCP: Loralee Pacas, MD REFERRING PROVIDER: Loralee Pacas, MD  END OF SESSION:   PT End of Session - 07/08/22 1513     Visit Number 2    Number of Visits 26    Date for PT Re-Evaluation 10/01/22    Authorization Type [employer]    Authorization Time Period 2023 OOP & ded MET.    PT Start Time 2440    PT Stop Time 1600    PT Time Calculation (min) 45 min    Equipment Utilized During Treatment Gait belt    Activity Tolerance Patient tolerated treatment well;Patient limited by pain    Behavior During Therapy [facility] for tasks assessed/performed             Past Medical History:  Diagnosis Date   Acquired complex renal cyst 09/25/2019   AKI (acute kidney injury) ([place]) 07/22/2021   Cancer ([place])    renal   Chest pain 07/04/2535   Complication of anesthesia    Decubitus ulcer of sacral area 12/15/2021   Depression    Diabetes mellitus without complication ([place])    Healthcare maintenance 09/25/2019   Hematuria 09/25/2019   History of kidney stones    Lactic acidosis 12/15/2021   Leukocytosis 09/13/2021   Osteomyelitis ([place])    Sacral pressure injury of skin 07/27/2021   Septic shock ([place]) 07/22/2021   Severe sepsis with acute organ dysfunction ([place]) 07/22/2021   Shortness of breath 05/16/2021   Stage 3a chronic kidney disease ([place]) 64/40/3474   Systolic dysfunction    Type 2 diabetes mellitus 10/12/2019   Past Surgical History:  Procedure Laterality Date   AMPUTATION Left 07/23/2021   Procedure: LEFT BELOW KNEE AMPUTATION;  Surgeon: [name], MD;  Location: [place];  Service: Orthopedics;  Laterality: Left;   BELOW KNEE LEG AMPUTATION Right    BUBBLE STUDY  07/29/2021   Procedure: BUBBLE STUDY;  Surgeon: Sueanne Margarita, MD;  Location: [place];  Service: Cardiovascular;;   CARDIOVERSION N/A 07/29/2021    Procedure: CARDIOVERSION;  Surgeon: Sueanne Margarita, MD;  Location: [facility] ENDOSCOPY;  Service: Cardiovascular;  Laterality: N/A;   RADIOLOGY WITH ANESTHESIA N/A 08/05/2021   Procedure: MRI LUMBAR WITH AND WITHOUT; THORASIC SPINE WITH AND WITHOUT WITH ANESTHESIA;  Surgeon: Radiologist, Medication, MD;  Location: [place];  Service: Radiology;  Laterality: N/A;   RADIOLOGY WITH ANESTHESIA N/A 08/07/2021   Procedure: MRI WITH LUMBER WITH AND WITHOUT CONTRAST,THORACIC WITH AND WITHOUT CONTRAST;  Surgeon: Radiologist, Medication, MD;  Location: [name];  Service: Radiology;  Laterality: N/A;   RADIOLOGY WITH ANESTHESIA N/A 09/13/2021   Procedure: MRI WITH ANESTHESIA;  Surgeon: Luanne Bras, MD;  Location: [place];  Service: Radiology;  Laterality: N/A;   TEE WITHOUT CARDIOVERSION N/A 07/29/2021   Procedure: TRANSESOPHAGEAL ECHOCARDIOGRAM (TEE);  Surgeon: Sueanne Margarita, MD;  Location: [place];  Service: Cardiovascular;  Laterality: N/A;   Patient Active Problem List   Diagnosis Date Noted   Malaise 06/22/2022   Polycythemia 06/22/2022   Amputation below knee ([place]) 04/23/2022   PTSD (post-traumatic stress disorder) 04/23/2022   Generalized anxiety disorder 01/21/2022   Infected ulcer of skin ([place]) 12/29/2021   Episode of unresponsiveness 12/15/2021   Acute respiratory failure with hypoxia (HCC) secondary to suspected aspiration pneumonia 12/15/2021   Renal mass 25/95/6387   Complicated UTI (urinary tract infection) 12/15/2021   Stage IV  pressure ulcer of sacral region ([place]) 09/15/2021   Insomnia 27/03/8674   Chronic systolic CHF (congestive heart failure) ([place]) 09/14/2021   Hypogonadism in male 09/14/2021   Chronic pain 09/14/2021   Pseudohyponatremia 09/13/2021   Normocytic anemia 09/13/2021   Discitis of thoracic region    Gas gangrene of foot ([place]) 07/22/2021   Paroxysmal atrial flutter ([place]) 07/22/2021   Chronic diastolic (congestive) heart failure ([place]) 05/16/2021   Diabetic infection  of left foot ([place]) 04/17/2021   Lumbar spondylosis 03/25/2021   H/O amputation of leg through tibia and fibula ([place]) 11/10/2019   Degeneration of lumbar intervertebral disc 11/10/2019   Hypercholesterolemia 11/10/2019   Left below-knee amputee ([place]) 10/30/2019   Dyslipidemia 10/30/2019   Type 2 diabetes mellitus with diabetic polyneuropathy, with long-term current use of insulin ([place]) 10/12/2019   Right below-elbow amputee 10/12/2019   Hypertensive disorder 09/25/2019   Type 2 diabetes mellitus with hyperglycemia, with long-term current use of insulin ([place]) 09/25/2019   Urinary hesitancy 09/25/2019   Prosthesis adjustments 09/25/2019   Erectile dysfunction 09/25/2019   Palliative care status 09/25/2019   Pulmonary nodules 09/25/2019   Depressive disorder 09/25/2019    REFERRING DIAG: G89.4 (ICD-10-CM) - Chronic pain syndrome   Z89.512 Left Below Knee Amputation, Z89.511 history of right Below Knee Amputation.  THERAPY DIAG:  Unsteadiness on feet  Other abnormalities of gait and mobility  Muscle weakness (generalized)  Other low back pain  Rationale for Evaluation and Treatment Rehabilitation  PERTINENT HISTORY:  DM2, CHF, HTN, chronic pain, bilateral TTAs  PRECAUTIONS: Fall  SUBJECTIVE:                                                                                                                                                                                      SUBJECTIVE STATEMENT:  He saw Brooke Pace, [facility] on Monday after PT who adjusted right socket & realign both prostheses.   PAIN:  Are you having pain? Are you having pain? Yes: NPRS scale: today 8/10 and in last week ranging 8/10-10/10 Pain location: middle low back Pain description: sharp Aggravating factors: extension Relieving factors: sitting forward,   OBJECTIVE: (objective measures completed at initial evaluation unless otherwise dated)   OBJECTIVE:  COGNITION: Overall cognitive status: Within  functional limits for tasks assessed   POSTURE: rounded shoulders, forward head, flexed trunk , and weight shift left   LOWER EXTREMITY ROM:   ROM P:passive  A:active Right eval Left eval  Hip flexion      Hip extension Standing A: -15* Standing A: -15*  Hip abduction      Hip adduction      Hip internal rotation  Hip external rotation      Knee flexion      Knee extension      Ankle dorsiflexion      Ankle plantarflexion      Ankle inversion      Ankle eversion       (Blank rows = not tested)   LOWER EXTREMITY MMT:   MMT Right eval Left eval  Hip flexion 4/5 4/5  Hip extension 3-/5 3-/5  Hip abduction 3/5 3/5  Hip adduction      Hip internal rotation      Hip external rotation      Knee flexion 4/5 4/5  Knee extension 4/5 4/5  Ankle dorsiflexion NA NA  Ankle plantarflexion NA NA  Ankle inversion NA NA  Ankle eversion NA NA  (Blank rows = not tested)   TRANSFERS: Sit to stand: SBA 22" w/c requiring armrests to RW for support for stabilization with poor technique.  Stand to sit: SBA requires RW support to 22" w/c requiring armrests with poor technique.    GAIT: Gait pattern: step to pattern, decreased step length- Right, decreased stance time- Left, Left hip hike, antalgic, trunk flexed, and wide BOS Distance walked: 72' with 90* turn right & left and 180* turn Assistive device utilized: Environmental consultant - 2 wheeled and TTA prosthesis Level of assistance: Min A esp with turns Comments: excessive BUE weight bearing on RW   FUNCTIONAL TESTs:  Berg Balance Scale: 10/56   [facility] PT Assessment - 07/06/22 1300                Standardized Balance Assessment    Standardized Balance Assessment Berg Balance Test          Berg Balance Test    Sit to Stand Needs minimal aid to stand or to stabilize     Standing Unsupported Needs several tries to stand 30 seconds unsupported     Sitting with Back Unsupported but Feet Supported on Floor or Stool Able to sit safely and  securely 2 minutes     Stand to Sit Sits independently, has uncontrolled descent     Transfers Able to transfer safely, definite need of hands     Standing Unsupported with Eyes Closed Needs help to keep from falling     Standing Unsupported with Feet Together Needs help to attain position and unable to hold for 15 seconds     From Standing, Reach Forward with Outstretched Arm Loses balance while trying/requires external support     From Standing Position, Pick up Object from Floor Unable to try/needs assist to keep balance     From Standing Position, Turn to Look Behind Over each Shoulder Needs assist to keep from losing balance and falling     Turn 360 Degrees Needs assistance while turning     Standing Unsupported, Alternately Place Feet on Step/Stool Needs assistance to keep from falling or unable to try     Standing Unsupported, One Foot in [employer] balance while stepping or standing     Standing on One Leg Unable to try or needs assist to prevent fall     Total Score 10     Berg comment: BERG  < 36 high risk for falls (close to 100%) 46-51 moderate (>50%)   37-45 significant (>80%) 52-55 lower (> 25%)                   CURRENT PROSTHETIC WEAR ASSESSMENT: Patient is dependent with: skin check, residual limb  care, prosthetic cleaning, ply sock cleaning, correct ply sock adjustment, proper wear schedule/adjustment, and proper weight-bearing schedule/adjustment Donning prosthesis: SBA / verbal cues Doffing prosthesis: SBA / verbal cues Prosthetic wear tolerance: liner & flexible suction socket for most of awake hours and wears prostheses maybe ~40% of awake hours,  7 days/week Prosthetic weight bearing tolerance: 5 minutes Edema: none noted Residual limb condition: bilateral redness over patella, dry skin, normal color & temperature.  Prosthetic description: suction pin suspension with 50m silicon liner as interface & flexible inner socket, total contact socket, dynamic response  Flex feet       TODAY'S TREATMENT:                                                                                                                             DATE:  07/06/2022 Today's session focused on HEP instruction.  Therapeutic Activities: Pt improved sit to/from stand initial 10 reps from w/c with cushion 22" and then 5 reps from w/c without cushion 19" using sink to stabilize with both.  PT demo & verbal cues on technique with proper weight shift.   Neuromuscular Re-ed: Sink HEP working on standing balance, proprioception using limb / socket interface and pelvic movement for weight shift.    07/08/22 1604  PT Education  Education Details HEP Access Code: [email or web address] sink program  Person(s) Educated Patient;Other (comment)  Methods Explanation;Demonstration;Tactile cues;Verbal cues;Handout  Comprehension Verbalized understanding;Returned demonstration;Verbal cues required;Tactile cues required;Need further instruction     HOME EXERCISE PROGRAM: Access Code: [email or web address] URL: [email or web address] Date: 07/08/2022 Prepared by: RJamey Reas Exercises - Sit to Stand with Counter Support  - 2-3 x daily - 7 x weekly - 1 sets - 10 reps - 5 seconds hold - Seated Hamstring Stretch with Strap  - 2-3 x daily - 7 x weekly - 1 sets - 2-3 reps - 20-30 seconds hold   Do each exercise 1-2  times per day Do each exercise 5-10 repetitions Hold each exercise for 2 seconds to feel [place]  Try to find this position when standing still for activities.   USE TAPE ON FLOOR TO MARK THE MIDLINE POSITION which is even with middle of sink.  You also should try to feel with your limb pressure in socket.  You are trying to feel with limb what you used to feel with the bottom of your foot.  Side to Side Shift: Moving your hips only (not shoulders): move weight onto your left leg, HOLD/FEEL  pressure in socket.  Move back to equal weight on each leg, HOLD/FEEL pressure in socket. Move weight onto your right leg, HOLD/FEEL pressure in socket. Move back to equal weight on each leg, HOLD/FEEL pressure in socket. Repeat.  Start with both hands on sink, progress to right hand only hold, then left hand only hold  Front to Back Shift: Moving your hips  only (not shoulders): move your weight forward onto your toes, HOLD/FEEL pressure in socket. Move your weight back to equal Flat Foot on both legs, HOLD/FEEL  pressure in socket. Move your weight back onto your heels, HOLD/FEEL  pressure in socket. Move your weight back to equal on both legs, HOLD/FEEL  pressure in socket. Repeat.  Start with both hands on sink, progress to right hand only hold, then left hand only hold  Moving Cones / Cups: With equal weight on each leg: Hold on with one hand the first time, then progress to no hand supports. Move cups from one side of sink to the other. Place cups ~2" out of your reach, progress to 10" beyond reach.  Place one hand in middle of sink and reach with other hand. Do both arms.   Overhead/Upward Reaching: alternated reaching up to top cabinets or ceiling if no cabinets present. Keep equal weight on each leg. Start with one hand support on counter while other hand reaches and progress to no hand support with reaching.  ace one hand in middle of sink and reach with other hand. Do both arms.   5.   Looking Over Shoulders: With equal weight on each leg: alternate turning to look over your shoulders with one hand support on counter as needed.  Start with head motions only to look in front of shoulder, then even with shoulder and progress to looking behind you. To look to side, move head /eyes, then shoulder on side looking pulls back, shift more weight to side looking and pull hip back. Place one hand in middle of sink and let go with other hand so your shoulder can pull back. Switch hands to look other way.   6.   Alternating Stepping or tapping toes into lower cabinet:  Move items under cabinet out of your way. Shift your hips/pelvis so weight on right leg & Tighten muscles in hip on right side.  SLOWLY step left leg so front of foot is in cabinet. Then step back to floor with right hip tight.  Repeat with other leg.    PATIENT EDUCATION: PATIENT EDUCATED ON FOLLOWING PROSTHETIC CARE: Education details:   Skin check, Prosthetic cleaning, and Propper donning , positioning in sitting and proper sit to/from stand technique. Person educated: Patient and friend Education method: Explanation and Verbal cues Education comprehension: verbalized understanding, verbal cues required, and needs further education   HOME EXERCISE PROGRAM:   ASSESSMENT:   CLINICAL IMPRESSION: PT instructed in initial HEP including standing balance activities at sink which he appears to understand.  He improved sit to/from stand with instruction in technique.     OBJECTIVE IMPAIRMENTS: Abnormal gait, decreased activity tolerance, decreased balance, decreased knowledge of use of DME, decreased mobility, decreased ROM, decreased strength, impaired flexibility, postural dysfunction, prosthetic dependency , and pain.    ACTIVITY LIMITATIONS: carrying, lifting, bending, sitting, standing, squatting, stairs, transfers, and locomotion level   PARTICIPATION LIMITATIONS: meal prep, cleaning, driving, and community activity   PERSONAL FACTORS: Fitness, Time since onset of injury/illness/exacerbation, and 3+ comorbidities: see PMH  are also affecting patient's functional outcome.    REHAB POTENTIAL: Good   CLINICAL DECISION MAKING: Evolving/moderate complexity   EVALUATION COMPLEXITY: Moderate     GOALS: Goals reviewed with patient? Yes   SHORT TERM GOALS: Target date: 08/06/2022   Patient donnes prosthesis modified independent & verbalizes proper cleaning. Baseline: SEE OBJECTIVE DATA Goal status: INITIAL 2.  Patient verbalizes  understanding of initial HEP.  Baseline: SEE  OBJECTIVE DATA Goal status: INITIAL   3.  Patient able to stand 60 seconds without UE support and reach 5" anteriorly with supervision.  Baseline: SEE OBJECTIVE DATA Goal status: INITIAL   4. Patient ambulates 48' with RW & prosthesis with supervision. Baseline: SEE OBJECTIVE DATA Goal status: INITIAL   5. Patient negotiates ramps & curbs with RW & prosthesis with minA. Baseline: SEE OBJECTIVE DATA Goal status: INITIAL   LONG TERM GOALS: Target date: 10/01/2022   Patient demonstrates & verbalized understanding of prosthetic care to enable safe utilization of prosthesis. Baseline: SEE OBJECTIVE DATA Goal status: INITIAL   Patient tolerates prostheses wear >90% of awake hours without skin or limb pain issues. Baseline: SEE OBJECTIVE DATA Goal status: INITIAL   Berg Balance >/= 45/56 to indicate lower fall risk Baseline: SEE OBJECTIVE DATA Goal status: INITIAL   Patient ambulates >300' with prostheses and cane or less independently Baseline: SEE OBJECTIVE DATA Goal status: INITIAL   Patient negotiates ramps, curbs & stairs with single rail with prostheses and cane or less independently. Baseline: SEE OBJECTIVE DATA Goal status: INITIAL   6.   Patient verbalizes & demonstrates understanding of how to properly use fitness equipment to return to gym. Baseline: SEE OBJECTIVE DATA Goal status: INITIAL   PLAN:   PT FREQUENCY: 2x/week   PT DURATION: 90 days / 13 weeks   PLANNED INTERVENTIONS: Therapeutic exercises, Therapeutic activity, Neuromuscular re-education, Balance training, Gait training, Patient/Family education, Self Care, Stair training, Prosthetic training, DME instructions, Electrical stimulation, Cryotherapy, Moist heat, and physical performance testing.   PLAN FOR NEXT SESSION:  verbally check on HEP.  Prosthetic gait with RW.    Jamey Reas, PT, DPT 07/08/2022, 4:13 PM

## 2022-07-08 NOTE — Patient Instructions (Signed)
Do each exercise 1-2  times per day Do each exercise 5-10 repetitions Hold each exercise for 2 seconds to feel your location  AT [address].  Try to find this position when standing still for activities.   USE TAPE ON FLOOR TO MARK THE MIDLINE POSITION which is even with middle of sink.  You also should try to feel with your limb pressure in socket.  You are trying to feel with limb what you used to feel with the bottom of your foot.  Side to Side Shift: Moving your hips only (not shoulders): move weight onto your left leg, HOLD/FEEL pressure in socket.  Move back to equal weight on each leg, HOLD/FEEL pressure in socket. Move weight onto your right leg, HOLD/FEEL pressure in socket. Move back to equal weight on each leg, HOLD/FEEL pressure in socket. Repeat.  Start with both hands on sink, progress to right hand only hold, then left hand only hold  Front to Back Shift: Moving your hips only (not shoulders): move your weight forward onto your toes, HOLD/FEEL pressure in socket. Move your weight back to equal Flat Foot on both legs, HOLD/FEEL  pressure in socket. Move your weight back onto your heels, HOLD/FEEL  pressure in socket. Move your weight back to equal on both legs, HOLD/FEEL  pressure in socket. Repeat.  Start with both hands on sink, progress to right hand only hold, then left hand only hold  Moving Cones / Cups: With equal weight on each leg: Hold on with one hand the first time, then progress to no hand supports. Move cups from one side of sink to the other. Place cups ~2" out of your reach, progress to 10" beyond reach.  Place one hand in middle of sink and reach with other hand. Do both arms.   Overhead/Upward Reaching: alternated reaching up to top cabinets or ceiling if no cabinets present. Keep equal weight on each leg. Start with one hand support on counter while other hand reaches and progress to no hand support with  reaching.  ace one hand in middle of sink and reach with other hand. Do both arms.   5.   Looking Over Shoulders: With equal weight on each leg: alternate turning to look over your shoulders with one hand support on counter as needed.  Start with head motions only to look in front of shoulder, then even with shoulder and progress to looking behind you. To look to side, move head /eyes, then shoulder on side looking pulls back, shift more weight to side looking and pull hip back. Place one hand in middle of sink and let go with other hand so your shoulder can pull back. Switch hands to look other way.   6.  Alternating Stepping or tapping toes into lower cabinet:  Move items under cabinet out of your way. Shift your hips/pelvis so weight on right leg & Tighten muscles in hip on right side.  SLOWLY step left leg so front of foot is in cabinet. Then step back to floor with right hip tight.  Repeat with other leg.

## 2022-07-13 ENCOUNTER — Encounter (HOSPITAL_BASED_OUTPATIENT_CLINIC_OR_DEPARTMENT_OTHER): Payer: Medicare HMO | Attending: General Surgery | Admitting: General Surgery

## 2022-07-13 DIAGNOSIS — Z89512 Acquired absence of left leg below knee: Secondary | ICD-10-CM | POA: Insufficient documentation

## 2022-07-13 DIAGNOSIS — I110 Hypertensive heart disease with heart failure: Secondary | ICD-10-CM | POA: Insufficient documentation

## 2022-07-13 DIAGNOSIS — L89154 Pressure ulcer of sacral region, stage 4: Secondary | ICD-10-CM | POA: Diagnosis not present

## 2022-07-13 DIAGNOSIS — G8929 Other chronic pain: Secondary | ICD-10-CM | POA: Diagnosis not present

## 2022-07-13 DIAGNOSIS — Z89511 Acquired absence of right leg below knee: Secondary | ICD-10-CM | POA: Insufficient documentation

## 2022-07-13 DIAGNOSIS — E11622 Type 2 diabetes mellitus with other skin ulcer: Secondary | ICD-10-CM | POA: Diagnosis not present

## 2022-07-13 DIAGNOSIS — I509 Heart failure, unspecified: Secondary | ICD-10-CM | POA: Diagnosis not present

## 2022-07-13 NOTE — Progress Notes (Signed)
ORLYN, ODONOGHUE ([id number]) [id number] Page 1 of 8 Visit Report for 07/13/2022 Arrival Information Details Patient Name: Date of Service: [name], [name] 07/13/2022 2:45 PM Medical Record Number: [id number] Patient Account Number: [id number] Date of Birth/Sex: Treating RN: 09/12/1965 (56 y.o. M) Primary Care Iya Hamed: [place] RRISO [name] Other Clinician: Referring Jameel Quant: Treating Jaymes Hang/Extender: [name] [place] RRISO [name] Weeks in Treatment: 26 Visit Information History Since Last Visit All ordered tests and consults were completed: No Patient Arrived: Wheel Chair Added or deleted any medications: No Arrival Time: 14:48 Any new allergies or adverse reactions: No Accompanied By: wife Had a fall or experienced change in No Transfer Assistance: None activities of daily living that may affect Patient Identification Verified: Yes risk of falls: Secondary Verification Process Completed: Yes Signs or symptoms of abuse/neglect since last visito No Patient Requires Transmission-Based Precautions: No Hospitalized since last visit: No Patient Has Alerts: No Implantable device outside of the clinic excluding No cellular tissue based products placed in the center since last visit: Pain Present Now: Yes Electronic Signature(s) Signed: 07/13/2022 4:10:49 PM By: Worthy Rancher Entered By: Worthy Rancher on 07/13/2022 14:49:01 -------------------------------------------------------------------------------- Encounter Discharge Information Details Patient Name: Date of Service: [name], [name] 07/13/2022 2:45 PM Medical Record Number: [id number] Patient Account Number: [id number] Date of Birth/Sex: Treating RN: [birth year] (56 y.o. [name] Primary Care Linwood Gullikson: [place] Delphina Cahill Other Clinician: Referring Braxston Quinter: Treating Zahniya Zellars/Extender: [name] [place] RRISO [name] Weeks in Treatment: 26 Encounter Discharge Information Items Post  Procedure Vitals Discharge Condition: Stable Temperature (F): 97.7 Ambulatory Status: Wheelchair Pulse (bpm): 109 Discharge Destination: Home Respiratory Rate (breaths/min): 18 Transportation: Private Auto Blood Pressure (mmHg): 149/87 Accompanied By: friend Schedule Follow-up Appointment: Yes Clinical Summary of Care: Patient Declined Electronic Signature(s) Signed: 07/13/2022 4:54:31 PM By: Baruch Gouty RN, BSN Entered By: Baruch Gouty on 07/13/2022 15:39:52 [name] ([id number]) [id number] Page 2 of 8 -------------------------------------------------------------------------------- Lower Extremity Assessment Details Patient Name: Date of Service: [name], [name] 07/13/2022 2:45 PM Medical Record Number: [id number] Patient Account Number: [id number] Date of Birth/Sex: Treating RN: [birth year] (56 y.o. [name] Primary Care Lindalou Soltis: [place] Delphina Cahill Other Clinician: Referring Ayasha Ellingsen: Treating Draken Farrior/Extender: [name] [place] RRISO [name] Weeks in Treatment: 26 Electronic Signature(s) Signed: 07/13/2022 4:54:31 PM By: Baruch Gouty RN, BSN Entered By: Baruch Gouty on 07/13/2022 15:01:01 -------------------------------------------------------------------------------- Multi Wound Chart Details Patient Name: Date of Service: [name], [name] 07/13/2022 2:45 PM Medical Record Number: [id number] Patient Account Number: [id number] Date of Birth/Sex: Treating RN: 06/13/1966 (56 y.o. M) Primary Care Taquana Bartley: [place] RRISO [name] Other Clinician: Referring Sina Sumpter: Treating Anikin Prosser/Extender: [name] [place] RRISO N, RYA N Weeks in Treatment: 26 Vital Signs Height(in): Capillary Blood Glucose(mg/dl): 128 Weight(lbs): Pulse(bpm): 109 Body Mass Index(BMI): Blood Pressure(mmHg): 149/87 Temperature(F): 97.7 Respiratory Rate(breaths/min): 20 [1:Photos:] [N/A:N/A] Sacrum N/A N/A Wound Location: Pressure Injury N/A  N/A Wounding Event: Pressure Ulcer N/A N/A Primary Etiology: Congestive Heart Failure, N/A N/A Comorbid History: Hypertension, Type II Diabetes, Osteomyelitis, Confinement Anxiety 07/29/2021 N/A N/A Date Acquired: 18 N/A N/A Weeks of Treatment: Open N/A N/A Wound Status: No N/A N/A Wound Recurrence: 1.5x1.3x1 N/A N/A Measurements L x W x D (cm) 1.532 N/A N/A A (cm) : rea 1.532 N/A N/A Volume (cm) : 74.00% N/A N/A % Reduction in A rea: 76.40% N/A N/A % Reduction in Volume: 12 Starting Position 1 (o'clock): 12 Ending Position 1 (o'clock): 1.2 Maximum Distance 1 (cm): Yes  N/A N/A Undermining: Category/Stage IV N/A N/A Classification: Medium N/A N/A Exudate A mount: Serosanguineous N/A N/A Exudate TypeARKIN, IMRAN ([id number]) [id number] Page 3 of 8 red, brown N/A N/A Exudate Color: Epibole N/A N/A Wound Margin: Large (67-100%) N/A N/A Granulation Amount: Red, Pink N/A N/A Granulation Quality: Small (1-33%) N/A N/A Necrotic Amount: Fat Layer (Subcutaneous Tissue): Yes N/A N/A Exposed Structures: Fascia: No Tendon: No Muscle: No Joint: No Bone: No Small (1-33%) N/A N/A Epithelialization: Debridement - Selective/Open Wound N/A N/A Debridement: Pre-procedure Verification/Time Out 15:20 N/A N/A Taken: Lidocaine 4% Topical Solution N/A N/A Pain Control: Skin/Epidermis N/A N/A Level: 1.95 N/A N/A Debridement A (sq cm): rea Curette N/A N/A Instrument: Minimum N/A N/A Bleeding: Pressure N/A N/A Hemostasis A chieved: 0 N/A N/A Procedural Pain: 0 N/A N/A Post Procedural Pain: Procedure was tolerated well N/A N/A Debridement Treatment Response: 1.5x1.3x1 N/A N/A Post Debridement Measurements L x W x D (cm) 1.532 N/A N/A Post Debridement Volume: (cm) Category/Stage IV N/A N/A Post Debridement Stage: Callus: Yes N/A N/A Periwound Skin Texture: Rash: Yes Maceration: No N/A N/A Periwound Skin  Moisture: Dry/Scaly: No No Abnormalities Noted N/A N/A Periwound Skin Color: No Abnormality N/A N/A Temperature: Debridement N/A N/A Procedures Performed: Treatment Notes Wound #1 (Sacrum) Cleanser Peri-Wound Care Zinc Oxide Ointment 30g tube Discharge Instruction: Apply Zinc Oxide to macerated periwound with each dressing change as needed Lotrisone Discharge Instruction: Apply Lotrisone to buttock rash with dressing changes Topical Keystone antibiotic compound Discharge Instruction: in base of wound under gauze Primary Dressing Dakin's Solution 0.25%, 16 (oz) Discharge Instruction: Moisten gauze with Dakin's solution and pack into wound once out of keystone antibiotic Medline Woven Gauze Sponges 4x4 (in/in) Discharge Instruction: pack into wound, moisten with saline and Keystone compound, smaller dressing over wound to hold packing in wouind Secondary Dressing MPM Excel SAP Bordered Dressing, 7x6.7 (Sacral) (in/in) Discharge Instruction: Apply silicone border over primary dressing as directed. Secured With Compression Wrap Compression Stockings Environmental education officer) Signed: 07/13/2022 3:43:05 PM By: [name] MD FACS Entered By: [name] on 07/13/2022 15:43:04 [name] ([id number]) [id number] Page 4 of 8 -------------------------------------------------------------------------------- Multi-Disciplinary Care Plan Details Patient Name: Date of Service: [name], [name] 07/13/2022 2:45 PM Medical Record Number: [id number] Patient Account Number: [id number] Date of Birth/Sex: Treating RN: March 14, 1966 (56 y.o. [name] Primary Care Charels Stambaugh: [place] Delphina Cahill Other Clinician: Referring Jaskarn Schweer: Treating Alixandrea Milleson/Extender: [name] [place] RRISO Suzette Battiest in Treatment: [place] reviewed with physician Active Inactive Nutrition Nursing Diagnoses: Impaired glucose control: actual  or potential Potential for alteratiion in Nutrition/Potential for imbalanced nutrition Goals: Patient/caregiver will maintain therapeutic glucose control Date Initiated: 01/08/2022 Target Resolution Date: 08/06/2022 Goal Status: Active Interventions: Assess HgA1c results as ordered upon admission and as needed Assess patient nutrition upon admission and as needed per policy Provide education on elevated blood sugars and impact on wound healing Treatment Activities: Dietary management education, guidance and counseling : 01/08/2022 Patient referred to Primary Care Physician for further nutritional evaluation : 01/08/2022 Notes: Pressure Nursing Diagnoses: Knowledge deficit related to causes and risk factors for pressure ulcer development Knowledge deficit related to management of pressures ulcers Potential for impaired tissue integrity related to pressure, friction, moisture, and shear Goals: Patient/caregiver will verbalize understanding of pressure ulcer management Date Initiated: 01/08/2022 Target Resolution Date: 08/06/2022 Goal Status: Active Interventions: Assess: immobility, friction, shearing, incontinence upon admission and as needed Assess offloading mechanisms upon admission and as needed Assess potential for pressure ulcer  upon admission and as needed Notes: Wound/Skin Impairment Nursing Diagnoses: Impaired tissue integrity Knowledge deficit related to ulceration/compromised skin integrity Goals: Patient/caregiver will verbalize understanding of skin care regimen Date Initiated: 01/08/2022 Target Resolution Date: 08/06/2022 Goal Status: Active Ulcer/skin breakdown will have a volume reduction of 30% by week 4 Date Initiated: 01/08/2022 Date Inactivated: 02/18/2022 Target Resolution Date: 02/05/2022 Goal Status: Unmet Unmet Reason: VAC leaking Ulcer/skin breakdown will have a volume reduction of 50% by week 8 Date Initiated: 02/18/2022 Date Inactivated: 03/04/2022 Target  Resolution Date: 03/05/2022 Goal Status: Unmet Unmet Reason: infection Ulcer/skin breakdown will have a volume reduction of 80% by week 12 [name], [name] ([id number]) [id number] Page 5 of 8 Date Initiated: 03/04/2022 Date Inactivated: 04/01/2022 Target Resolution Date: 04/02/2022 Goal Status: Unmet Unmet Reason: too much moisture Interventions: Assess patient/caregiver ability to obtain necessary supplies Assess patient/caregiver ability to perform ulcer/skin care regimen upon admission and as needed Assess ulceration(s) every visit Provide education on ulcer and skin care Treatment Activities: Skin care regimen initiated : 01/08/2022 Topical wound management initiated : 01/08/2022 Notes: Electronic Signature(s) Signed: 07/13/2022 4:54:31 PM By: Baruch Gouty RN, BSN Entered By: Baruch Gouty on 07/13/2022 15:04:29 -------------------------------------------------------------------------------- Pain Assessment Details Patient Name: Date of Service: [name], [name] 07/13/2022 2:45 PM Medical Record Number: [id number] Patient Account Number: [id number] Date of Birth/Sex: Treating RN: 1966/07/07 (56 y.o. M) Primary Care Tomasina Keasling: [place] RRISO [name] Other Clinician: Referring Eldridge Marcott: Treating Eyleen Rawlinson/Extender: [name] [place] RRISO N, RYA N Weeks in Treatment: 26 Active Problems Location of Pain Severity and Description of Pain Patient Has Paino Yes Site Locations Rate the pain. Current Pain Level: 5 Worst Pain Level: 10 Least Pain Level: 0 Tolerable Pain Level: 4 Pain Management and Medication Current Pain Management: Electronic Signature(s) Signed: 07/13/2022 4:10:49 PM By: Worthy Rancher Entered By: Worthy Rancher on 07/13/2022 14:49:59 [name] ([id number]) [id number] Page 6 of 8 -------------------------------------------------------------------------------- Patient/Caregiver Education Details Patient Name: Date  of Service: [name], [name] 11/6/2023andnbsp2:45 PM Medical Record Number: [id number] Patient Account Number: [id number] Date of Birth/Gender: Treating RN: March 27, 1966 (56 y.o. [name] Primary Care Physician: [place] Delphina Cahill Other Clinician: Referring Physician: Treating Physician/Extender: [name] [place] RRISO Suzette Battiest in Treatment: 24 Education Assessment Education Provided To: Patient Education Topics Provided Elevated Blood Sugar/ Impact on Healing: Methods: Explain/Verbal Responses: Reinforcements needed, State content correctly Pressure: Methods: Explain/Verbal Responses: Reinforcements needed, State content correctly Wound/Skin Impairment: Methods: Explain/Verbal Responses: Reinforcements needed, State content correctly Electronic Signature(s) Signed: 07/13/2022 4:54:31 PM By: Baruch Gouty RN, BSN Entered By: Baruch Gouty on 07/13/2022 15:04:58 -------------------------------------------------------------------------------- Wound Assessment Details Patient Name: Date of Service: [name], [name] 07/13/2022 2:45 PM Medical Record Number: [id number] Patient Account Number: [id number] Date of Birth/Sex: Treating RN: [birth year] (56 y.o. M) Primary Care Shelby Peltz: [place] RRISO [name] Other Clinician: Referring Antonis Lor: Treating Lutricia Widjaja/Extender: [name] [place] RRISO N, RYA N Weeks in Treatment: 26 Wound Status Wound Number: 1 Primary Pressure Ulcer Etiology: Wound Location: Sacrum Wound Open Wounding Event: Pressure Injury Status: Date Acquired: 07/29/2021 Comorbid Congestive Heart Failure, Hypertension, Type II Diabetes, Weeks Of Treatment: 26 History: Osteomyelitis, Confinement Anxiety Clustered Wound: No Photos Wound Measurements [name], [name] ([id number]) Length: (cm) 1. Width: (cm) 1. Depth: (cm) 1 Area: (cm) 1 Volume: (cm) 1 [id number] Page 7 of 8 5 % Reduction in Area: 74% 3 % Reduction in  Volume: 76.4% Epithelialization: Small (1-33%) .532 Tunneling: No .532 Undermining: Yes Starting Position (o'clock): 12 Ending Position (o'clock): 12 Maximum  Distance: (cm) 1.2 Wound Description Classification: Category/Stage IV Wound Margin: Epibole Exudate Amount: Medium Exudate Type: Serosanguineous Exudate Color: red, brown Foul Odor After Cleansing: No Slough/Fibrino Yes Wound Bed Granulation Amount: Large (67-100%) Exposed Structure Granulation Quality: Red, Pink Fascia Exposed: No Necrotic Amount: Small (1-33%) Fat Layer (Subcutaneous Tissue) Exposed: Yes Necrotic Quality: Adherent Slough Tendon Exposed: No Muscle Exposed: No Joint Exposed: No Bone Exposed: No Periwound Skin Texture Texture Color No Abnormalities Noted: No No Abnormalities Noted: Yes Callus: Yes Temperature / Pain Rash: Yes Temperature: No Abnormality Moisture No Abnormalities Noted: No Dry / Scaly: No Maceration: No Treatment Notes Wound #1 (Sacrum) Cleanser Peri-Wound Care Zinc Oxide Ointment 30g tube Discharge Instruction: Apply Zinc Oxide to macerated periwound with each dressing change as needed Lotrisone Discharge Instruction: Apply Lotrisone to buttock rash with dressing changes Topical Keystone antibiotic compound Discharge Instruction: in base of wound under gauze Primary Dressing Dakin's Solution 0.25%, 16 (oz) Discharge Instruction: Moisten gauze with Dakin's solution and pack into wound once out of keystone antibiotic Medline Woven Gauze Sponges 4x4 (in/in) Discharge Instruction: pack into wound, moisten with saline and Keystone compound, smaller dressing over wound to hold packing in wouind Secondary Dressing MPM Excel SAP Bordered Dressing, 7x6.7 (Sacral) (in/in) Discharge Instruction: Apply silicone border over primary dressing as directed. Secured With Compression Wrap Compression Stockings Environmental education officer) Signed: 07/13/2022 4:54:31 PM By: Baruch Gouty RN, BSN Entered By: Baruch Gouty on 07/13/2022 15:03:50 [name] ([id number]) [id number] Page 8 of 8 -------------------------------------------------------------------------------- Vitals Details Patient Name: Date of Service: [name], [name] 07/13/2022 2:45 PM Medical Record Number: [id number] Patient Account Number: [id number] Date of Birth/Sex: Treating RN: 04-23-1966 (56 y.o. M) Primary Care Alistar Mcenery: [place] RRISO [name] Other Clinician: Referring Reneshia Zuccaro: Treating Etherine Mackowiak/Extender: [name] [place] RRISO [name] Weeks in Treatment: 26 Vital Signs Time Taken: 02:45 Temperature (F): 97.7 Pulse (bpm): 109 Respiratory Rate (breaths/min): 20 Blood Pressure (mmHg): 149/87 Capillary Blood Glucose (mg/dl): 128 Reference Range: 80 - 120 mg / dl Notes glucose per pt report this am Electronic Signature(s) Signed: 07/13/2022 4:54:31 PM By: Baruch Gouty RN, BSN Entered By: Baruch Gouty on 07/13/2022 15:05:19

## 2022-07-13 NOTE — Progress Notes (Signed)
[name], [name] ([id number]) [id number] Page 1 of 10 Visit Report for 07/13/2022 Chief Complaint Document Details Patient Name: Date of Service: [place], [place] 07/13/2022 2:45 PM Medical Record Number: [id number] Patient Account Number: [id number] Date of Birth/Sex: Treating RN: 1965-09-21 (56 y.o. M) Primary Care Provider: MO RRISO [name] Other Clinician: Referring Provider: Treating Provider/Extender: [name] MO RRISO [name] Weeks in Treatment: 26 Information Obtained from: Patient Chief Complaint Patient is at the clinic for treatment of an open pressure ulcer Electronic Signature(s) Signed: 07/13/2022 3:43:10 PM By: [name] MD FACS Entered By: [name] on 07/13/2022 15:43:10 -------------------------------------------------------------------------------- Debridement Details Patient Name: Date of Service: [name], [place] 07/13/2022 2:45 PM Medical Record Number: [id number] Patient Account Number: [id number] Date of Birth/Sex: Treating RN: 1965-09-28 (56 y.o. [name] Primary Care Provider: MO Delphina Cahill Other Clinician: Referring Provider: Treating Provider/Extender: [name] MO RRISO [name] Weeks in Treatment: 26 Debridement Performed for Assessment: Wound #1 Sacrum Performed By: Physician [name], MD Debridement Type: Debridement Level of Consciousness (Pre-procedure): Awake and Alert Pre-procedure Verification/Time Out Yes - 15:20 Taken: Start Time: 15:20 Pain Control: Lidocaine 4% T opical Solution T Area Debrided (L x W): otal 1.5 (cm) x 1.3 (cm) = 1.95 (cm) Tissue and other material debrided: Non-Viable, Skin: Epidermis Level: Skin/Epidermis Debridement Description: Selective/Open Wound Instrument: Curette Bleeding: Minimum Hemostasis Achieved: Pressure Procedural Pain: 0 Post Procedural Pain: 0 Response to Treatment: Procedure was tolerated well Level of Consciousness  (Post- Awake and Alert procedure): Post Debridement Measurements of Total Wound Length: (cm) 1.5 Stage: Category/Stage IV Width: (cm) 1.3 Depth: (cm) 1 Volume: (cm) 1.532 Character of Wound/Ulcer Post Debridement: Improved Post Procedure Diagnosis Same as Robbie Lis ([id number]) [id number] Page 2 of 10 Notes scribed by Baruch Gouty, RN for Dr. Celine Ahr Electronic Signature(s) Signed: 07/13/2022 4:03:30 PM By: [name] MD FACS Signed: 07/13/2022 4:54:31 PM By: Baruch Gouty RN, BSN Entered By: Baruch Gouty on 07/13/2022 15:38:52 -------------------------------------------------------------------------------- HPI Details Patient Name: Date of Service: [name], [place] 07/13/2022 2:45 PM Medical Record Number: [id number] Patient Account Number: [id number] Date of Birth/Sex: Treating RN: [birth year] (56 y.o. M) Primary Care Provider: MO Delphina Cahill Other Clinician: Referring Provider: Treating Provider/Extender: [name] MO RRISO [name] Weeks in Treatment: 26 History of Present Illness HPI Description: ADMISSION 01/08/2022 This is a 56 year old male with a past medical history notable for type 2 diabetes mellitus (last A1c was 8.6) congestive heart failure, hypertension, chronic pain, and bilateral below-knee amputations. His most recent amputation was in November 2022. While he was in the hospital, he developed a sacral pressure ulcer. He was subsequently in a skilled nursing facility for some time. He was discharged with home health and had been in a wound VAC, but was then admitted to the hospital last week when the wound appeared to be worsening. Apparently the periwound skin was macerated and the device that he had been using was leaking quite a bit. Evaluation while in the hospital included a consultation with infectious disease who did not think he had any evidence of osteomyelitis, plastic surgery who felt  that he was not an appropriate flap candidate (their note also states that he is not interested in a flap) and wound care who took him out of the wound VAC and initiated wet-to-dry dressing changes. He has a new wound VAC from [facility] on order, anticipated delivery today. The wound itself is fairly small and isolated to the  sacrum. There is muscle exposed. No bone is appreciated but it is palpable beneath the surface. The muscle itself is bit pale and there is heaped up epibole around the wound edges. No significant odor or drainage. 01/14/2022: His wound VAC was not initiated until this past Friday. He has not had any issues with the VAC but today we found that the bridge foam was applied directly to the skin rather than over a layer of adhesive drape. His home health nurse also requested that we consider applying silver collagen to the wound bed in addition to the VAC. There is still a little bit of heaped up senescent skin around the wound periphery. No significant change to the wound dimensions. 01/21/2022: No significant change to the wound dimensions. The senescent skin has not reaccumulated. The periwound skin remains a bit macerated but without any obvious breakdown. The wound surface itself has a shiny appearance with a little bit of slough accumulation; no true robust granulation tissue at this time. 01/28/2022: The wound is about the same size but a little bit shallower. There is a little bit of senescent skin reaccumulation at the cranial aspect. The periwound skin is red but not macerated and without any tissue breakdown. The wound still does not have the most robust surface. There is a bit of slough accumulation. 02/04/2022: The wound is a bit smaller and the undermining has come in somewhat. There continues to be granulation tissue formation within the wound bed. No significant slough accumulation and his periwound skin is in better condition. 02/18/2022: The wound stinks today. There is no  obvious pus but the drainage and wound itself are malodorous. He continues to have heaped up tissue within the wound bed that is rather grayish and not particularly robust-appearing. He is very angry today with his situation. 02/25/2022: Last week, in response to the odor coming from the wound, I took a culture and prescribed topical gentamicin as well as oral cefdinir. Apparently [facility] contacted him about a topical compounded antibiotic, but he did not realize this and he hung up on them. Today, the odor is no longer present. There is some senescent skin around the wound margins as well as continued heaped up granulation tissue in the center of the wound. The undermining continues to contract. 03/04/2022: The wound continues to contract and look better. He still has heaped up hypertrophic granulation tissue near the orifice. No odor was coming from the wound today. He is awaiting [place] delivery. 03/18/2022: 2-week follow-up. [facility] topical antibiotic has been initiated. The chemical cauterization of the hypertrophic granulation tissue was quite successful and the surface is much flatter today. He has heaped up senescent skin around the perimeter. His home health providers have figured out a way to keep the wound VAC from losing suction by bolstering with DuoDERM. Overall there has been significant improvement since our last visit. 04/01/2022: 2-week follow-up. The wound continues to contract. Once again, there is heaped up senescent skin around the perimeter. He has a little bit of skin breakdown in the distribution of the adhesive drape from the wound VAC. No odor or purulent drainage. No concern for infection. 04/15/2022: 2-week follow-up. He has developed a fairly substantial rash from the drape adhesive for his wound VAC. The periwound has a lot of heaped up epiboly at the margins. The tissue in the wound bed is a little bit pale but there is no odor or purulent drainage. 04/22/2022: Last week  we discontinued the VAC. Today, the skin around his  wound is in significantly improved condition. His rash is resolving. He does have some heaped up senescent skin around the wound margin and a layer of slough on the wound surface, but there is no concern for active infection. 04/29/2022: The wound continues to contract. The surface is clean. He continues to build up senescent skin around the wound edges that subsequently get a little bit macerated. No concern for infection. 05/06/2022: The wound is smaller again today in all dimensions. The surface is clean. He has his usual accumulation of macerated senescent skin around the [place], Herbie [place] ([id number]) [id number] Page 3 of 10 wound edges. 05/13/2022: Continued wound contracture. The undermining has decreased quite a bit. Clean surface. Senescent skin heaped up around the wound edges, as per usual. 05/22/2022: The macerated senescent skin has accumulated once again. The wound dimensions are slightly smaller and the surface is clean. He has been having difficulty keeping the packing in the wound due to the large foam border dressing that he prefers. 06/03/2022: The wound is smaller and shallower with less undermining. He has built up macerated senescent skin around the margins, as per usual. He and his friend have figured out a way to make sure that his packing stays in the wound with his [place]. 06/12/2022: The wound is a little bit shallower again today. He still has accumulated senescent periwound skin, but otherwise the wound is clean. He will be undergoing bilateral recurrent inguinal hernia repair and umbilical hernia repair next week. 10/20 sacral pressure ulcer. using [facility] and backing wet to dry. Has developed a rash sur rounding the wound 07/03/2022: Continued contracture of the wound. It is very clean and there is no odor or purulent drainage. Continued buildup of senescent skin around the margin. 07/13/2022:  There is less undermining present today. The orifice continues to contract. He continues to accumulate senescent skin around the perimeter. The rash around the wound has improved. Electronic Signature(s) Signed: 07/13/2022 3:48:57 PM By: [name] MD FACS Previous Signature: 07/13/2022 3:44:01 PM Version By: [name] MD FACS Entered By: [name] on 07/13/2022 15:48:57 -------------------------------------------------------------------------------- Physical Exam Details Patient Name: Date of Service: [name], [place] 07/13/2022 2:45 PM Medical Record Number: [id number] Patient Account Number: [id number] Date of Birth/Sex: Treating RN: 1966/08/01 (56 y.o. M) Primary Care Provider: MO RRISO [name] Other Clinician: Referring Provider: Treating Provider/Extender: [name] MO RRISO N, RYA N Weeks in Treatment: 26 Constitutional Slightly hypertensive. Slightly tachycardic, asymptomatic. . . No acute distress.Marland Kitchen Respiratory Normal work of breathing on room air.. Notes 07/13/2022: There is less undermining present today. The orifice continues to contract. He continues to accumulate senescent skin around the perimeter. The rash around the wound has improved. Electronic Signature(s) Signed: 07/13/2022 3:49:32 PM By: [name] MD FACS Entered By: [name] on 07/13/2022 15:49:32 -------------------------------------------------------------------------------- Physician Orders Details Patient Name: Date of Service: [name], [place] 07/13/2022 2:45 PM Medical Record Number: [id number] Patient Account Number: [id number] Date of Birth/Sex: Treating RN: 27-Mar-1966 (56 y.o. [name] Primary Care Provider: MO Delphina Cahill Other Clinician: Referring Provider: Treating Provider/Extender: [name] MO RRISO [name] Weeks in Treatment: 69 Verbal / Phone Orders: No Diagnosis Coding MURTAZA, SHELL ([id number])  [id number] Page 4 of 10 ICD-10 Coding Code Description L89.154 Pressure ulcer of sacral region, stage 4 Z89.512 Acquired absence of left leg below knee Z89.511 Acquired absence of right leg below knee Follow-up Appointments ppointment in 2 weeks. - Dr. Celine Ahr RM 1 with [facility] Basta Return A  Anesthetic Wound #1 Sacrum (In clinic) Topical Lidocaine 4% applied to wound bed Bathing/ Shower/ Hygiene May shower with protection but do not get wound dressing(s) wet. Off-Loading Gel mattress overlay (Group 1) Turn and reposition every 2 hours Wound Treatment Wound #1 - Sacrum Peri-Wound Care: Zinc Oxide Ointment 30g tube 3 x Per Week/30 Days Discharge Instructions: Apply Zinc Oxide to macerated periwound with each dressing change as needed Peri-Wound Care: Lotrisone 3 x Per Week/30 Days Discharge Instructions: Apply Lotrisone to buttock rash with dressing changes Topical: [facility] antibiotic compound 3 x Per Week/30 Days Discharge Instructions: in base of wound under gauze Prim Dressing: Dakin's Solution 0.25%, 16 (oz) 3 x Per Week/30 Days ary Discharge Instructions: Moisten gauze with Dakin's solution and pack into wound once out of [facility]stone antibiotic Prim Dressing: Medline Woven Gauze Sponges 4x4 (in/in) 3 x Per Week/30 Days ary Discharge Instructions: pack into wound, moisten with saline and [facility] compound, smaller dressing over wound to hold packing in wouind Secondary Dressing: MPM Excel SAP Bordered Dressing, 7x6.7 (Sacral) (in/in) 3 x Per Week/30 Days Discharge Instructions: Apply silicone border over primary dressing as directed. Electronic Signature(s) Signed: 07/13/2022 4:03:30 PM By: [name] MD FACS Entered By: [name] on 07/13/2022 15:50:38 -------------------------------------------------------------------------------- Problem List Details Patient Name: Date of Service: [name], [place] 07/13/2022 2:45 PM Medical Record Number:  [id number] Patient Account Number: [id number] Date of Birth/Sex: Treating RN: 06-21-1966 (56 y.o. [name] Primary Care Provider: MO Delphina Cahill Other Clinician: Referring Provider: Treating Provider/Extender: [name] MO RRISO [name] Weeks in Treatment: 26 Active Problems ICD-10 Encounter Code Description Active Date MDM Diagnosis L89.154 Pressure ulcer of sacral region, stage 4 01/08/2022 No Yes Z89.512 Acquired absence of left leg below knee 01/08/2022 No Yes Koc, Trajon ([id number]) [id number] Page 5 of 10 Z89.511 Acquired absence of right leg below knee 01/08/2022 No Yes Inactive Problems Resolved Problems Electronic Signature(s) Signed: 07/13/2022 3:42:59 PM By: [name] MD FACS Entered By: [name] on 07/13/2022 15:42:59 -------------------------------------------------------------------------------- Progress Note Details Patient Name: Date of Service: [name], [place] 07/13/2022 2:45 PM Medical Record Number: [id number] Patient Account Number: [id number] Date of Birth/Sex: Treating RN: Feb 08, 1966 (56 y.o. M) Primary Care Provider: MO RRISO [name] Other Clinician: Referring Provider: Treating Provider/Extender: [name] MO RRISO [name] Weeks in Treatment: 26 Subjective Chief Complaint Information obtained from Patient Patient is at the clinic for treatment of an open pressure ulcer History of Present Illness (HPI) ADMISSION 01/08/2022 This is a 57 year old male with a past medical history notable for type 2 diabetes mellitus (last A1c was 8.6) congestive heart failure, hypertension, chronic pain, and bilateral below-knee amputations. His most recent amputation was in November 2022. While he was in the hospital, he developed a sacral pressure ulcer. He was subsequently in a skilled nursing facility for some time. He was discharged with home health and had been in a wound VAC, but was then admitted  to the hospital last week when the wound appeared to be worsening. Apparently the periwound skin was macerated and the device that he had been using was leaking quite a bit. Evaluation while in the hospital included a consultation with infectious disease who did not think he had any evidence of osteomyelitis, plastic surgery who felt that he was not an appropriate flap candidate (their note also states that he is not interested in a flap) and wound care who took him out of the wound VAC and initiated wet-to-dry dressing  changes. He has a new wound VAC from [facility] on order, anticipated delivery today. The wound itself is fairly small and isolated to the sacrum. There is muscle exposed. No bone is appreciated but it is palpable beneath the surface. The muscle itself is bit pale and there is heaped up epibole around the wound edges. No significant odor or drainage. 01/14/2022: His wound VAC was not initiated until this past Friday. He has not had any issues with the VAC but today we found that the bridge foam was applied directly to the skin rather than over a layer of adhesive drape. His home health nurse also requested that we consider applying silver collagen to the wound bed in addition to the VAC. There is still a little bit of heaped up senescent skin around the wound periphery. No significant change to the wound dimensions. 01/21/2022: No significant change to the wound dimensions. The senescent skin has not reaccumulated. The periwound skin remains a bit macerated but without any obvious breakdown. The wound surface itself has a shiny appearance with a little bit of slough accumulation; no true robust granulation tissue at this time. 01/28/2022: The wound is about the same size but a little bit shallower. There is a little bit of senescent skin reaccumulation at the cranial aspect. The periwound skin is red but not macerated and without any tissue breakdown. The wound still does not have the most robust  surface. There is a bit of slough accumulation. 02/04/2022: The wound is a bit smaller and the undermining has come in somewhat. There continues to be granulation tissue formation within the wound bed. No significant slough accumulation and his periwound skin is in better condition. 02/18/2022: The wound stinks today. There is no obvious pus but the drainage and wound itself are malodorous. He continues to have heaped up tissue within the wound bed that is rather grayish and not particularly robust-appearing. He is very angry today with his situation. 02/25/2022: Last week, in response to the odor coming from the wound, I took a culture and prescribed topical gentamicin as well as oral cefdinir. Apparently [facility] contacted him about a topical compounded antibiotic, but he did not realize this and he hung up on them. Today, the odor is no longer present. There is some senescent skin around the wound margins as well as continued heaped up granulation tissue in the center of the wound. The undermining continues to contract. 03/04/2022: The wound continues to contract and look better. He still has heaped up hypertrophic granulation tissue near the orifice. No odor was coming from the wound today. He is awaiting [place] delivery. 03/18/2022: 2-week follow-up. [facility] topical antibiotic has been initiated. The chemical cauterization of the hypertrophic granulation tissue was quite successful and the surface is much flatter today. He has heaped up senescent skin around the perimeter. His home health providers have figured out a way to keep the wound VAC from losing suction by bolstering with DuoDERM. Overall there has been significant improvement since our last visit. MATTIAS, WALMSLEY ([id number]) [id number] Page 6 of 10 04/01/2022: 2-week follow-up. The wound continues to contract. Once again, there is heaped up senescent skin around the perimeter. He has a little bit of  skin breakdown in the distribution of the adhesive drape from the wound VAC. No odor or purulent drainage. No concern for infection. 04/15/2022: 2-week follow-up. He has developed a fairly substantial rash from the drape adhesive for his wound VAC. The periwound has a lot of heaped up epiboly at the  margins. The tissue in the wound bed is a little bit pale but there is no odor or purulent drainage. 04/22/2022: Last week we discontinued the VAC. Today, the skin around his wound is in significantly improved condition. His rash is resolving. He does have some heaped up senescent skin around the wound margin and a layer of slough on the wound surface, but there is no concern for active infection. 04/29/2022: The wound continues to contract. The surface is clean. He continues to build up senescent skin around the wound edges that subsequently get a little bit macerated. No concern for infection. 05/06/2022: The wound is smaller again today in all dimensions. The surface is clean. He has his usual accumulation of macerated senescent skin around the wound edges. 05/13/2022: Continued wound contracture. The undermining has decreased quite a bit. Clean surface. Senescent skin heaped up around the wound edges, as per usual. 05/22/2022: The macerated senescent skin has accumulated once again. The wound dimensions are slightly smaller and the surface is clean. He has been having difficulty keeping the packing in the wound due to the large foam border dressing that he prefers. 06/03/2022: The wound is smaller and shallower with less undermining. He has built up macerated senescent skin around the margins, as per usual. He and his friend have figured out a way to make sure that his packing stays in the wound with his [place]. 06/12/2022: The wound is a little bit shallower again today. He still has accumulated senescent periwound skin, but otherwise the wound is clean. He will be undergoing bilateral recurrent inguinal  hernia repair and umbilical hernia repair next week. 10/20 sacral pressure ulcer. using [facility] and backing wet to dry. Has developed a rash sur rounding the wound 07/03/2022: Continued contracture of the wound. It is very clean and there is no odor or purulent drainage. Continued buildup of senescent skin around the margin. 07/13/2022: There is less undermining present today. The orifice continues to contract. He continues to accumulate senescent skin around the perimeter. The rash around the wound has improved. Patient History Information obtained from Patient, Caregiver, Chart. Family History Cancer - Father, Heart Disease - Father,Paternal Grandparents, Hypertension - Father, No family history of Diabetes, Hereditary Spherocytosis, Kidney Disease, Lung Disease, Seizures, Stroke, Thyroid Problems, Tuberculosis. Social History Never smoker, Marital Status - Divorced, Alcohol Use - Never, Drug Use - No History, Caffeine Use - Moderate - coffee. Medical History Eyes Denies history of Cataracts, Glaucoma, Optic Neuritis Ear/Nose/Mouth/Throat Denies history of Chronic sinus problems/congestion, Middle ear problems Cardiovascular Patient has history of Congestive Heart Failure, Hypertension Endocrine Patient has history of Type II Diabetes Denies history of Type I Diabetes Genitourinary Denies history of End Stage Renal Disease Integumentary (Skin) Denies history of History of Burn Musculoskeletal Patient has history of Osteomyelitis - S1 and coccyx Oncologic Denies history of Received Chemotherapy, Received Radiation Psychiatric Patient has history of Confinement Anxiety Denies history of Anorexia/bulimia Hospitalization/Surgery History - bil BKA. Medical A Surgical History Notes nd Respiratory pulmonary nodules Genitourinary renal mass, urinary hesitancy Musculoskeletal discitis of thoracic region Objective Constitutional PERSHING, SKIDMORE ([id number])  [id number] Page 7 of 10 Slightly hypertensive. Slightly tachycardic, asymptomatic. No acute distress.. Vitals Time Taken: 2:45 AM, Temperature: 97.7 F, Pulse: 109 bpm, Respiratory Rate: 20 breaths/min, Blood Pressure: 149/87 mmHg, Capillary Blood Glucose: 128 mg/dl. General Notes: glucose per pt report this am Respiratory Normal work of breathing on room air.. General Notes: 07/13/2022: There is less undermining present today. The orifice continues to contract. He continues to  accumulate senescent skin around the perimeter. The rash around the wound has improved. Integumentary (Hair, Skin) Wound #1 status is Open. Original cause of wound was Pressure Injury. The date acquired was: 07/29/2021. The wound has been in treatment 26 weeks. The wound is located on the Sacrum. The wound measures 1.5cm length x 1.3cm width x 1cm depth; 1.532cm^2 area and 1.532cm^3 volume. There is Fat Layer (Subcutaneous Tissue) exposed. There is no tunneling noted, however, there is undermining starting at 12:00 and ending at 12:00 with a maximum distance of 1.2cm. There is a medium amount of serosanguineous drainage noted. The wound margin is epibole. There is large (67-100%) red, pink granulation within the wound bed. There is a small (1-33%) amount of necrotic tissue within the wound bed including Adherent Slough. The periwound skin appearance had no abnormalities noted for color. The periwound skin appearance exhibited: Callus, Rash. The periwound skin appearance did not exhibit: Dry/Scaly, Maceration. Periwound temperature was noted as No Abnormality. 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 There was a Selective/Open Wound Skin/Epidermis Debridement with a total area  of 1.95 sq cm performed by [name], MD. With the following instrument(s): Curette to remove Non-Viable tissue/material. Material removed includes Skin: Epidermis after achieving pain control using Lidocaine 4% Topical Solution. No specimens were taken. A time out was conducted at 15:20, prior to the start of the procedure. A Minimum amount of bleeding was controlled with Pressure. The procedure was tolerated well with a pain level of 0 throughout and a pain level of 0 following the procedure. Post Debridement Measurements: 1.5cm length x 1.3cm width x 1cm depth; 1.532cm^3 volume. Post debridement Stage noted as Category/Stage IV. Character of Wound/Ulcer Post Debridement is improved. Post procedure Diagnosis Wound #1: Same as Pre-Procedure General Notes: scribed by Baruch Gouty, RN for Dr. Celine Ahr. Plan Follow-up Appointments: Return Appointment in 2 weeks. - Dr. Celine Ahr RM 1 with Vaughan Basta Anesthetic: Wound #1 Sacrum: (In clinic) Topical Lidocaine 4% applied to wound bed Bathing/ Shower/ Hygiene: May shower with protection but do not get wound dressing(s) wet. Off-Loading: Gel mattress overlay (Group 1) Turn and reposition every 2 hours WOUND #1: - Sacrum Wound Laterality: Peri-Wound Care: Zinc Oxide Ointment 30g tube 3 x Per Week/30 Days Discharge Instructions: Apply Zinc Oxide to macerated periwound with each dressing change as needed Peri-Wound Care: Lotrisone 3 x Per Week/30 Days Discharge Instructions: Apply Lotrisone to buttock rash with dressing changes Topical: Keystone antibiotic compound 3 x Per Week/30 Days Discharge Instructions: in base of wound under gauze Prim Dressing: Dakin's Solution 0.25%, 16 (oz) 3 x Per Week/30 Days ary Discharge Instructions: Moisten gauze with Dakin's solution and pack into wound once out of keystone antibiotic Prim Dressing: Medline Woven Gauze Sponges 4x4 (in/in) 3 x Per Week/30 Days ary Discharge Instructions: pack into wound, moisten  with saline and Keystone compound, smaller dressing over wound to hold packing in wouind Secondary Dressing: MPM Excel SAP Bordered Dressing, 7x6.7 (Sacral) (in/in) 3 x Per Week/30 Days Discharge Instructions: Apply silicone border over primary dressing as directed. 07/13/2022: There is less undermining present today. The orifice continues to contract. He continues to accumulate senescent skin around the perimeter. The rash around the wound has improved. CAYLEB, JARNIGAN ([id number]) [id number] Page 8 of 10 I used a curette to debride the senescent  skin from around the opening of his wound. We will continue to use his Keystone topical compounded antibiotic to moisten gauze packing. Once he is out of the [place], we will transition to Dakin's. Continue packing the wound. Follow-up in 1 week. Electronic Signature(s) Signed: 07/13/2022 3:51:27 PM By: [name] MD FACS Entered By: [name] on 07/13/2022 15:51:27 -------------------------------------------------------------------------------- HxROS Details Patient Name: Date of Service: [name], [place] 07/13/2022 2:45 PM Medical Record Number: [id number] Patient Account Number: [id number] Date of Birth/Sex: Treating RN: 05-07-1966 (56 y.o. M) Primary Care Provider: [place] [facility] [name] Other Clinician: Referring Provider: Treating Provider/Extender: [name] [place] [facility] [name] Weeks in Treatment: 26 Information Obtained From Patient Caregiver Chart Eyes Medical History: Negative for: Cataracts; Glaucoma; Optic Neuritis Ear/Nose/Mouth/Throat Medical History: Negative for: Chronic sinus problems/congestion; Middle ear problems Respiratory Medical History: Past Medical History Notes: pulmonary nodules Cardiovascular Medical History: Positive for: Congestive Heart Failure; Hypertension Endocrine Medical History: Positive for: Type II Diabetes Negative for: Type I Diabetes Time with  diabetes: since 2004 Treated with: Insulin Blood sugar tested every day: Yes Tested : 4 times per day Genitourinary Medical History: Negative for: End Stage Renal Disease Past Medical History Notes: renal mass, urinary hesitancy Integumentary (Skin) Medical History: Negative for: History of Burn Musculoskeletal Medical History: Positive for: Osteomyelitis - S1 and coccyx Past Medical History Notes: discitis of thoracic region [place], Herbie [place] ([id number]) [id number] Page 9 of 10 Oncologic Medical History: Negative for: Received Chemotherapy; Received Radiation Psychiatric Medical History: Positive for: Confinement Anxiety Negative for: Anorexia/bulimia Immunizations Pneumococcal Vaccine: Received Pneumococcal Vaccination: No Implantable Devices No devices added Hospitalization / Surgery History Type of Hospitalization/Surgery bil BKA Family and Social History Cancer: Yes - Father; Diabetes: No; Heart Disease: Yes - Father,Paternal Grandparents; Hereditary Spherocytosis: No; Hypertension: Yes - Father; Kidney Disease: No; Lung Disease: No; Seizures: No; Stroke: No; Thyroid Problems: No; Tuberculosis: No; Never smoker; Marital Status - Divorced; Alcohol Use: Never; Drug Use: No History; Caffeine Use: Moderate - coffee; Financial Concerns: No; Food, Clothing or Shelter Needs: No; Support System Lacking: No; Transportation Concerns: Yes - hurts to transfer Electronic Signature(s) Signed: 07/13/2022 4:03:30 PM By: [name] MD FACS Entered By: [name] on 07/13/2022 15:49:02 -------------------------------------------------------------------------------- SuperBill Details Patient Name: Date of Service: [name], [place] 07/13/2022 Medical Record Number: [id number] Patient Account Number: [id number] Date of Birth/Sex: Treating RN: 1966-02-22 (56 y.o. M) Primary Care Provider: [place] [facility] [name] Other Clinician: Referring  Provider: Treating Provider/Extender: [name] [place] [facility] [name] Weeks in Treatment: 26 Diagnosis Coding ICD-10 Codes Code Description L89.154 Pressure ulcer of sacral region, stage 4 Z89.512 Acquired absence of left leg below knee Z89.511 Acquired absence of right leg below knee Facility Procedures : CPT4 Code: [id number] Description: 68341 - DEBRIDE WOUND 1ST 20 SQ CM OR < ICD-10 Diagnosis Description L89.154 Pressure ulcer of sacral region, stage 4 Modifier: Quantity: 1 Physician Procedures : CPT4 Code Description Modifier [id number] - WC PHYS LEVEL 4 - EST PT 25 ICD-10 Diagnosis Description L89.154 Pressure ulcer of sacral region, stage 4 Z89.512 Acquired absence of left leg below knee Logan, Adonys ([id number])  [id number] Z89.511 Acquired absence of right leg below knee Quantity: 1 f Page 10 of 10 : [id number] - WC PHYS DEBR WO ANESTH 20 SQ CM 1 ICD-10 Diagnosis Description L89.154 Pressure ulcer of sacral region, stage 4 Quantity: Electronic Signature(s) Signed: 07/13/2022 3:51:42 PM By: [name] MD FACS Entered By: [name] on 07/13/2022 15:51:42

## 2022-07-14 ENCOUNTER — Encounter: Payer: Self-pay | Admitting: Physical Therapy

## 2022-07-14 ENCOUNTER — Ambulatory Visit (INDEPENDENT_AMBULATORY_CARE_PROVIDER_SITE_OTHER): Payer: Medicare HMO | Admitting: Physical Therapy

## 2022-07-14 DIAGNOSIS — M6281 Muscle weakness (generalized): Secondary | ICD-10-CM | POA: Diagnosis not present

## 2022-07-14 DIAGNOSIS — R2689 Other abnormalities of gait and mobility: Secondary | ICD-10-CM

## 2022-07-14 DIAGNOSIS — M5459 Other low back pain: Secondary | ICD-10-CM

## 2022-07-14 DIAGNOSIS — R2681 Unsteadiness on feet: Secondary | ICD-10-CM | POA: Diagnosis not present

## 2022-07-14 NOTE — Therapy (Signed)
OUTPATIENT PHYSICAL THERAPY TREATMENT NOTE   Patient Name: [name] MRN: [id number] DOB:[birth year], 56 y.o., male Today's Date: 07/14/2022  PCP: Loralee Pacas, MD REFERRING PROVIDER: Loralee Pacas, MD  END OF SESSION:   PT End of Session - 07/14/22 1650     Visit Number 3    Number of Visits 26    Date for PT Re-Evaluation 10/01/22    Authorization Type [employer]    Authorization Time Period 2023 OOP & ded MET.    PT Start Time 9357    PT Stop Time 1428    PT Time Calculation (min) 43 min    Equipment Utilized During Treatment Gait belt    Activity Tolerance Patient tolerated treatment well;Patient limited by pain    Behavior During Therapy [facility] for tasks assessed/performed             Past Medical History:  Diagnosis Date   Acquired complex renal cyst 09/25/2019   AKI (acute kidney injury) ([place]) 07/22/2021   Cancer ([place])    renal   Chest pain 01/77/9390   Complication of anesthesia    Decubitus ulcer of sacral area 12/15/2021   Depression    Diabetes mellitus without complication ([place])    Healthcare maintenance 09/25/2019   Hematuria 09/25/2019   History of kidney stones    Lactic acidosis 12/15/2021   Leukocytosis 09/13/2021   Osteomyelitis ([place])    Sacral pressure injury of skin 07/27/2021   Septic shock ([place]) 07/22/2021   Severe sepsis with acute organ dysfunction ([place]) 07/22/2021   Shortness of breath 05/16/2021   Stage 3a chronic kidney disease ([place]) 30/05/2329   Systolic dysfunction    Type 2 diabetes mellitus 10/12/2019   Past Surgical History:  Procedure Laterality Date   AMPUTATION Left 07/23/2021   Procedure: LEFT BELOW KNEE AMPUTATION;  Surgeon: [name], MD;  Location: [place];  Service: Orthopedics;  Laterality: Left;   BELOW KNEE LEG AMPUTATION Right    BUBBLE STUDY  07/29/2021   Procedure: BUBBLE STUDY;  Surgeon: Sueanne Margarita, MD;  Location: [place];  Service: Cardiovascular;;   CARDIOVERSION N/A 07/29/2021    Procedure: CARDIOVERSION;  Surgeon: Sueanne Margarita, MD;  Location: [facility] ENDOSCOPY;  Service: Cardiovascular;  Laterality: N/A;   RADIOLOGY WITH ANESTHESIA N/A 08/05/2021   Procedure: MRI LUMBAR WITH AND WITHOUT; THORASIC SPINE WITH AND WITHOUT WITH ANESTHESIA;  Surgeon: Radiologist, Medication, MD;  Location: [place];  Service: Radiology;  Laterality: N/A;   RADIOLOGY WITH ANESTHESIA N/A 08/07/2021   Procedure: MRI WITH LUMBER WITH AND WITHOUT CONTRAST,THORACIC WITH AND WITHOUT CONTRAST;  Surgeon: Radiologist, Medication, MD;  Location: [place];  Service: Radiology;  Laterality: N/A;   RADIOLOGY WITH ANESTHESIA N/A 09/13/2021   Procedure: MRI WITH ANESTHESIA;  Surgeon: Luanne Bras, MD;  Location: [place];  Service: Radiology;  Laterality: N/A;   TEE WITHOUT CARDIOVERSION N/A 07/29/2021   Procedure: TRANSESOPHAGEAL ECHOCARDIOGRAM (TEE);  Surgeon: Sueanne Margarita, MD;  Location: [place];  Service: Cardiovascular;  Laterality: N/A;   Patient Active Problem List   Diagnosis Date Noted   Malaise 06/22/2022   Polycythemia 06/22/2022   Amputation below knee ([place]) 04/23/2022   PTSD (post-traumatic stress disorder) 04/23/2022   Generalized anxiety disorder 01/21/2022   Infected ulcer of skin ([place]) 12/29/2021   Episode of unresponsiveness 12/15/2021   Acute respiratory failure with hypoxia (HCC) secondary to suspected aspiration pneumonia 12/15/2021   Renal mass 07/62/2633   Complicated UTI (urinary tract infection) 12/15/2021   Stage IV  pressure ulcer of sacral region ([place]) 09/15/2021   Insomnia 32/67/1245   Chronic systolic CHF (congestive heart failure) ([place]) 09/14/2021   Hypogonadism in male 09/14/2021   Chronic pain 09/14/2021   Pseudohyponatremia 09/13/2021   Normocytic anemia 09/13/2021   Discitis of thoracic region    Gas gangrene of foot ([place]) 07/22/2021   Paroxysmal atrial flutter ([place]) 07/22/2021   Chronic diastolic (congestive) heart failure ([place]) 05/16/2021   Diabetic infection  of left foot ([place]) 04/17/2021   Lumbar spondylosis 03/25/2021   H/O amputation of leg through tibia and fibula ([place]) 11/10/2019   Degeneration of lumbar intervertebral disc 11/10/2019   Hypercholesterolemia 11/10/2019   Left below-knee amputee ([place]) 10/30/2019   Dyslipidemia 10/30/2019   Type 2 diabetes mellitus with diabetic polyneuropathy, with long-term current use of insulin ([place]) 10/12/2019   Right below-elbow amputee 10/12/2019   Hypertensive disorder 09/25/2019   Type 2 diabetes mellitus with hyperglycemia, with long-term current use of insulin ([place]) 09/25/2019   Urinary hesitancy 09/25/2019   Prosthesis adjustments 09/25/2019   Erectile dysfunction 09/25/2019   Palliative care status 09/25/2019   Pulmonary nodules 09/25/2019   Depressive disorder 09/25/2019    REFERRING DIAG: G89.4 (ICD-10-CM) - Chronic pain syndrome   Z89.512 Left Below Knee Amputation, Z89.511 history of right Below Knee Amputation.  THERAPY DIAG:  Unsteadiness on feet  Other abnormalities of gait and mobility  Muscle weakness (generalized)  Other low back pain  Rationale for Evaluation and Treatment Rehabilitation  PERTINENT HISTORY:  DM2, CHF, HTN, chronic pain, bilateral TTAs  PRECAUTIONS: Fall  SUBJECTIVE:                                                                                                                                                                                      SUBJECTIVE STATEMENT:  He says not having any pain in residual limbs but still with significant back pain.  PAIN:  Are you having pain? Are you having pain? Yes: NPRS scale: today 8/10 and in last week ranging 9/10 Pain location: middle low back Pain description: sharp Aggravating factors: extension Relieving factors: sitting forward,   OBJECTIVE: (objective measures completed at initial evaluation unless otherwise dated)   OBJECTIVE:  COGNITION: Overall cognitive status: Within functional limits for  tasks assessed   POSTURE: rounded shoulders, forward head, flexed trunk , and weight shift left   LOWER EXTREMITY ROM:   ROM P:passive  A:active Right eval Left eval  Hip flexion      Hip extension Standing A: -15* Standing A: -15*  Hip abduction      Hip adduction      Hip internal rotation      Hip  external rotation      Knee flexion      Knee extension      Ankle dorsiflexion      Ankle plantarflexion      Ankle inversion      Ankle eversion       (Blank rows = not tested)   LOWER EXTREMITY MMT:   MMT Right eval Left eval  Hip flexion 4/5 4/5  Hip extension 3-/5 3-/5  Hip abduction 3/5 3/5  Hip adduction      Hip internal rotation      Hip external rotation      Knee flexion 4/5 4/5  Knee extension 4/5 4/5  Ankle dorsiflexion NA NA  Ankle plantarflexion NA NA  Ankle inversion NA NA  Ankle eversion NA NA  (Blank rows = not tested)   TRANSFERS: Sit to stand: SBA 22" w/c requiring armrests to RW for support for stabilization with poor technique.  Stand to sit: SBA requires RW support to 22" w/c requiring armrests with poor technique.    GAIT: Gait pattern: step to pattern, decreased step length- Right, decreased stance time- Left, Left hip hike, antalgic, trunk flexed, and wide BOS Distance walked: 31' with 90* turn right & left and 180* turn Assistive device utilized: Environmental consultant - 2 wheeled and TTA prosthesis Level of assistance: Min A esp with turns Comments: excessive BUE weight bearing on RW   FUNCTIONAL TESTs:  Berg Balance Scale: 10/56   [facility] PT Assessment - 07/06/22 1300                Standardized Balance Assessment    Standardized Balance Assessment Berg Balance Test          Berg Balance Test    Sit to Stand Needs minimal aid to stand or to stabilize     Standing Unsupported Needs several tries to stand 30 seconds unsupported     Sitting with Back Unsupported but Feet Supported on Floor or Stool Able to sit safely and securely 2 minutes      Stand to Sit Sits independently, has uncontrolled descent     Transfers Able to transfer safely, definite need of hands     Standing Unsupported with Eyes Closed Needs help to keep from falling     Standing Unsupported with Feet Together Needs help to attain position and unable to hold for 15 seconds     From Standing, Reach Forward with Outstretched Arm Loses balance while trying/requires external support     From Standing Position, Pick up Object from Floor Unable to try/needs assist to keep balance     From Standing Position, Turn to Look Behind Over each Shoulder Needs assist to keep from losing balance and falling     Turn 360 Degrees Needs assistance while turning     Standing Unsupported, Alternately Place Feet on Step/Stool Needs assistance to keep from falling or unable to try     Standing Unsupported, One Foot in [employer] balance while stepping or standing     Standing on One Leg Unable to try or needs assist to prevent fall     Total Score 10     Berg comment: BERG  < 36 high risk for falls (close to 100%) 46-51 moderate (>50%)   37-45 significant (>80%) 52-55 lower (> 25%)                   CURRENT PROSTHETIC WEAR ASSESSMENT: Patient is dependent with: skin check, residual limb care,  prosthetic cleaning, ply sock cleaning, correct ply sock adjustment, proper wear schedule/adjustment, and proper weight-bearing schedule/adjustment Donning prosthesis: SBA / verbal cues Doffing prosthesis: SBA / verbal cues Prosthetic wear tolerance: liner & flexible suction socket for most of awake hours and wears prostheses maybe ~40% of awake hours,  7 days/week Prosthetic weight bearing tolerance: 5 minutes Edema: none noted Residual limb condition: bilateral redness over patella, dry skin, normal color & temperature.  Prosthetic description: suction pin suspension with 58m silicon liner as interface & flexible inner socket, total contact socket, dynamic response Flex feet       TODAY'S  TREATMENT:                                                                                                                             DATE:  07/14/22 Sit to stand X 10 using UE support from wheelchair with cushion Balance with feet apart and head/shoulder turns X 10 each Balance on airex pad 20 sec X 5 Gait with RW CGA and cues for upright posture and wider steps 75 feet X 4, then 100 feet X 2 with 180 degree turns at end to sit down to chair. Step ups in //bars onto 4 inch step X 10 bilat Step taps in //bars onto 4 inch step X 10 bilat  07/06/2022 Today's session focused on HEP instruction.  Therapeutic Activities: Pt improved sit to/from stand initial 10 reps from w/c with cushion 22" and then 5 reps from w/c without cushion 19" using sink to stabilize with both.  PT demo & verbal cues on technique with proper weight shift.   Neuromuscular Re-ed: Sink HEP working on standing balance, proprioception using limb / socket interface and pelvic movement for weight shift.    07/08/22 1604  PT Education  Education Details HEP Access Code: [email or web address] sink program  Person(s) Educated Patient;Other (comment)  Methods Explanation;Demonstration;Tactile cues;Verbal cues;Handout  Comprehension Verbalized understanding;Returned demonstration;Verbal cues required;Tactile cues required;Need further instruction     HOME EXERCISE PROGRAM: Access Code: [email or web address] URL: [email or web address] Date: 07/08/2022 Prepared by: RJamey Reas Exercises - Sit to Stand with Counter Support  - 2-3 x daily - 7 x weekly - 1 sets - 10 reps - 5 seconds hold - Seated Hamstring Stretch with Strap  - 2-3 x daily - 7 x weekly - 1 sets - 2-3 reps - 20-30 seconds hold   Do each exercise 1-2  times per day Do each exercise 5-10 repetitions Hold each exercise for 2 seconds to feel your location  AT [place]  Try to find this position  when standing still for activities.   USE TAPE ON FLOOR TO MARK THE MIDLINE POSITION which is even with middle of sink.  You also should try to feel with your limb pressure in socket.  You are trying to feel with limb what you used to feel with the bottom of  your foot.  Side to Side Shift: Moving your hips only (not shoulders): move weight onto your left leg, HOLD/FEEL pressure in socket.  Move back to equal weight on each leg, HOLD/FEEL pressure in socket. Move weight onto your right leg, HOLD/FEEL pressure in socket. Move back to equal weight on each leg, HOLD/FEEL pressure in socket. Repeat.  Start with both hands on sink, progress to right hand only hold, then left hand only hold  Front to Back Shift: Moving your hips only (not shoulders): move your weight forward onto your toes, HOLD/FEEL pressure in socket. Move your weight back to equal Flat Foot on both legs, HOLD/FEEL  pressure in socket. Move your weight back onto your heels, HOLD/FEEL  pressure in socket. Move your weight back to equal on both legs, HOLD/FEEL  pressure in socket. Repeat.  Start with both hands on sink, progress to right hand only hold, then left hand only hold  Moving Cones / Cups: With equal weight on each leg: Hold on with one hand the first time, then progress to no hand supports. Move cups from one side of sink to the other. Place cups ~2" out of your reach, progress to 10" beyond reach.  Place one hand in middle of sink and reach with other hand. Do both arms.   Overhead/Upward Reaching: alternated reaching up to top cabinets or ceiling if no cabinets present. Keep equal weight on each leg. Start with one hand support on counter while other hand reaches and progress to no hand support with reaching.  ace one hand in middle of sink and reach with other hand. Do both arms.   5.   Looking Over Shoulders: With equal weight on each leg: alternate turning to look over your shoulders with one hand support on counter as needed.   Start with head motions only to look in front of shoulder, then even with shoulder and progress to looking behind you. To look to side, move head /eyes, then shoulder on side looking pulls back, shift more weight to side looking and pull hip back. Place one hand in middle of sink and let go with other hand so your shoulder can pull back. Switch hands to look other way.   6.  Alternating Stepping or tapping toes into lower cabinet:  Move items under cabinet out of your way. Shift your hips/pelvis so weight on right leg & Tighten muscles in hip on right side.  SLOWLY step left leg so front of foot is in cabinet. Then step back to floor with right hip tight.  Repeat with other leg.    PATIENT EDUCATION: PATIENT EDUCATED ON FOLLOWING PROSTHETIC CARE: Education details:   Skin check, Prosthetic cleaning, and Propper donning , positioning in sitting and proper sit to/from stand technique. Person educated: Patient and friend Education method: Explanation and Verbal cues Education comprehension: verbalized understanding, verbal cues required, and needs further education   HOME EXERCISE PROGRAM:   ASSESSMENT:   CLINICAL IMPRESSION: He is overall progressing well but back pain does limit some of his standing and walking tolerance. He was safe during ambulation today using RW including turning 180 deg so I feel he can start walking with his RW at home short distances.    OBJECTIVE IMPAIRMENTS: Abnormal gait, decreased activity tolerance, decreased balance, decreased knowledge of use of DME, decreased mobility, decreased ROM, decreased strength, impaired flexibility, postural dysfunction, prosthetic dependency , and pain.    ACTIVITY LIMITATIONS: carrying, lifting, bending, sitting, standing, squatting, stairs, transfers, and  locomotion level   PARTICIPATION LIMITATIONS: meal prep, cleaning, driving, and community activity   PERSONAL FACTORS: Fitness, Time since onset of injury/illness/exacerbation, and  3+ comorbidities: see PMH  are also affecting patient's functional outcome.    REHAB POTENTIAL: Good   CLINICAL DECISION MAKING: Evolving/moderate complexity   EVALUATION COMPLEXITY: Moderate     GOALS: Goals reviewed with patient? Yes   SHORT TERM GOALS: Target date: 08/06/2022   Patient donnes prosthesis modified independent & verbalizes proper cleaning. Baseline: SEE OBJECTIVE DATA Goal status: INITIAL 2.  Patient verbalizes understanding of initial HEP.  Baseline: SEE OBJECTIVE DATA Goal status: INITIAL   3.  Patient able to stand 60 seconds without UE support and reach 5" anteriorly with supervision.  Baseline: SEE OBJECTIVE DATA Goal status: INITIAL   4. Patient ambulates 20' with RW & prosthesis with supervision. Baseline: SEE OBJECTIVE DATA Goal status: INITIAL   5. Patient negotiates ramps & curbs with RW & prosthesis with minA. Baseline: SEE OBJECTIVE DATA Goal status: INITIAL   LONG TERM GOALS: Target date: 10/01/2022   Patient demonstrates & verbalized understanding of prosthetic care to enable safe utilization of prosthesis. Baseline: SEE OBJECTIVE DATA Goal status: INITIAL   Patient tolerates prostheses wear >90% of awake hours without skin or limb pain issues. Baseline: SEE OBJECTIVE DATA Goal status: INITIAL   Berg Balance >/= 45/56 to indicate lower fall risk Baseline: SEE OBJECTIVE DATA Goal status: INITIAL   Patient ambulates >300' with prostheses and cane or less independently Baseline: SEE OBJECTIVE DATA Goal status: INITIAL   Patient negotiates ramps, curbs & stairs with single rail with prostheses and cane or less independently. Baseline: SEE OBJECTIVE DATA Goal status: INITIAL   6.   Patient verbalizes & demonstrates understanding of how to properly use fitness equipment to return to gym. Baseline: SEE OBJECTIVE DATA Goal status: INITIAL   PLAN:   PT FREQUENCY: 2x/week   PT DURATION: 90 days / 13 weeks   PLANNED INTERVENTIONS:  Therapeutic exercises, Therapeutic activity, Neuromuscular re-education, Balance training, Gait training, Patient/Family education, Self Care, Stair training, Prosthetic training, DME instructions, Electrical stimulation, Cryotherapy, Moist heat, and physical performance testing.   PLAN FOR NEXT SESSION:  balance, Prosthetic gait with RW.    Debbe Odea, PT, DPT 07/14/2022, 4:51 PM

## 2022-07-15 ENCOUNTER — Encounter: Payer: Self-pay | Admitting: Physical Therapy

## 2022-07-15 ENCOUNTER — Ambulatory Visit (INDEPENDENT_AMBULATORY_CARE_PROVIDER_SITE_OTHER): Payer: Medicare HMO | Admitting: Physical Therapy

## 2022-07-15 DIAGNOSIS — M6281 Muscle weakness (generalized): Secondary | ICD-10-CM

## 2022-07-15 DIAGNOSIS — R2681 Unsteadiness on feet: Secondary | ICD-10-CM | POA: Diagnosis not present

## 2022-07-15 DIAGNOSIS — M5459 Other low back pain: Secondary | ICD-10-CM

## 2022-07-15 DIAGNOSIS — R2689 Other abnormalities of gait and mobility: Secondary | ICD-10-CM

## 2022-07-15 NOTE — Therapy (Signed)
OUTPATIENT PHYSICAL THERAPY TREATMENT NOTE   Patient Name: [name] MRN: [id number] DOB:02-03-1966, 56 y.o., male Today's Date: 07/15/2022  PCP: Loralee Pacas, MD REFERRING PROVIDER: Loralee Pacas, MD  END OF SESSION:   PT End of Session - 07/15/22 1343     Visit Number 4    Number of Visits 26    Date for PT Re-Evaluation 10/01/22    Authorization Type [employer]    Authorization Time Period 2023 OOP & ded MET.    PT Start Time 1700    PT Stop Time 1430    PT Time Calculation (min) 47 min    Equipment Utilized During Treatment Gait belt    Activity Tolerance Patient tolerated treatment well;Patient limited by pain    Behavior During Therapy [facility] for tasks assessed/performed              Past Medical History:  Diagnosis Date   Acquired complex renal cyst 09/25/2019   AKI (acute kidney injury) ([place]) 07/22/2021   Cancer ([place])    renal   Chest pain 17/49/4496   Complication of anesthesia    Decubitus ulcer of sacral area 12/15/2021   Depression    Diabetes mellitus without complication ([place])    Healthcare maintenance 09/25/2019   Hematuria 09/25/2019   History of kidney stones    Lactic acidosis 12/15/2021   Leukocytosis 09/13/2021   Osteomyelitis ([place])    Sacral pressure injury of skin 07/27/2021   Septic shock ([place]) 07/22/2021   Severe sepsis with acute organ dysfunction ([place]) 07/22/2021   Shortness of breath 05/16/2021   Stage 3a chronic kidney disease ([place]) 75/91/6384   Systolic dysfunction    Type 2 diabetes mellitus 10/12/2019   Past Surgical History:  Procedure Laterality Date   AMPUTATION Left 07/23/2021   Procedure: LEFT BELOW KNEE AMPUTATION;  Surgeon: [name], MD;  Location: [place];  Service: Orthopedics;  Laterality: Left;   BELOW KNEE LEG AMPUTATION Right    BUBBLE STUDY  07/29/2021   Procedure: BUBBLE STUDY;  Surgeon: Sueanne Margarita, MD;  Location: [place];  Service: Cardiovascular;;   CARDIOVERSION N/A 07/29/2021    Procedure: CARDIOVERSION;  Surgeon: Sueanne Margarita, MD;  Location: [facility] ENDOSCOPY;  Service: Cardiovascular;  Laterality: N/A;   RADIOLOGY WITH ANESTHESIA N/A 08/05/2021   Procedure: MRI LUMBAR WITH AND WITHOUT; THORASIC SPINE WITH AND WITHOUT WITH ANESTHESIA;  Surgeon: Radiologist, Medication, MD;  Location: [place];  Service: Radiology;  Laterality: N/A;   RADIOLOGY WITH ANESTHESIA N/A 08/07/2021   Procedure: MRI WITH LUMBER WITH AND WITHOUT CONTRAST,THORACIC WITH AND WITHOUT CONTRAST;  Surgeon: Radiologist, Medication, MD;  Location: [place];  Service: Radiology;  Laterality: N/A;   RADIOLOGY WITH ANESTHESIA N/A 09/13/2021   Procedure: MRI WITH ANESTHESIA;  Surgeon: Luanne Bras, MD;  Location: [place];  Service: Radiology;  Laterality: N/A;   TEE WITHOUT CARDIOVERSION N/A 07/29/2021   Procedure: TRANSESOPHAGEAL ECHOCARDIOGRAM (TEE);  Surgeon: Sueanne Margarita, MD;  Location: [place];  Service: Cardiovascular;  Laterality: N/A;   Patient Active Problem List   Diagnosis Date Noted   Malaise 06/22/2022   Polycythemia 06/22/2022   Amputation below knee ([place]) 04/23/2022   PTSD (post-traumatic stress disorder) 04/23/2022   Generalized anxiety disorder 01/21/2022   Infected ulcer of skin ([place]) 12/29/2021   Episode of unresponsiveness 12/15/2021   Acute respiratory failure with hypoxia (H[facility]) secondary to suspected aspiration pneumonia 12/15/2021   Renal mass 66/59/9357   Complicated UTI (urinary tract infection) 12/15/2021   Stage  IV pressure ulcer of sacral region ([place]) 09/15/2021   Insomnia 82/99/3716   Chronic systolic CHF (congestive heart failure) ([place]) 09/14/2021   Hypogonadism in male 09/14/2021   Chronic pain 09/14/2021   Pseudohyponatremia 09/13/2021   Normocytic anemia 09/13/2021   Discitis of thoracic region    Gas gangrene of foot ([place]) 07/22/2021   Paroxysmal atrial flutter ([place]) 07/22/2021   Chronic diastolic (congestive) heart failure ([place]) 05/16/2021   Diabetic infection  of left foot ([place]) 04/17/2021   Lumbar spondylosis 03/25/2021   H/O amputation of leg through tibia and fibula ([place]) 11/10/2019   Degeneration of lumbar intervertebral disc 11/10/2019   Hypercholesterolemia 11/10/2019   Left below-knee amputee ([name]) 10/30/2019   Dyslipidemia 10/30/2019   Type 2 diabetes mellitus with diabetic polyneuropathy, with long-term current use of insulin ([place]) 10/12/2019   Right below-elbow amputee 10/12/2019   Hypertensive disorder 09/25/2019   Type 2 diabetes mellitus with hyperglycemia, with long-term current use of insulin ([place]) 09/25/2019   Urinary hesitancy 09/25/2019   Prosthesis adjustments 09/25/2019   Erectile dysfunction 09/25/2019   Palliative care status 09/25/2019   Pulmonary nodules 09/25/2019   Depressive disorder 09/25/2019    REFERRING DIAG: G89.4 (ICD-10-CM) - Chronic pain syndrome   Z89.512 Left Below Knee Amputation, Z89.511 history of right Below Knee Amputation.  THERAPY DIAG:  Unsteadiness on feet  Other abnormalities of gait and mobility  Muscle weakness (generalized)  Other low back pain  Rationale for Evaluation and Treatment Rehabilitation  PERTINENT HISTORY:  DM2, CHF, HTN, chronic pain, bilateral TTAs  PRECAUTIONS: Fall  SUBJECTIVE:                                                                                                                                                                                      SUBJECTIVE STATEMENT:  His back & hips were sore after PT yesterday doing activities that he has not done in a while.   PAIN:  Are you having pain? Are you having pain? Yes: NPRS scale: today 7/10 and in last week ranging 6-10/10 Pain location: middle low back Pain description: sharp Aggravating factors: extension Relieving factors: sitting forward,   OBJECTIVE: (objective measures completed at initial evaluation unless otherwise dated)   OBJECTIVE:  COGNITION: Overall cognitive status: Within  functional limits for tasks assessed   POSTURE: rounded shoulders, forward head, flexed trunk , and weight shift left   LOWER EXTREMITY ROM:   ROM P:passive  A:active Right eval Left eval  Hip flexion      Hip extension Standing A: -15* Standing A: -15*  Hip abduction      Hip adduction      Hip internal rotation  Hip external rotation      Knee flexion      Knee extension      Ankle dorsiflexion      Ankle plantarflexion      Ankle inversion      Ankle eversion       (Blank rows = not tested)   LOWER EXTREMITY MMT:   MMT Right eval Left eval  Hip flexion 4/5 4/5  Hip extension 3-/5 3-/5  Hip abduction 3/5 3/5  Hip adduction      Hip internal rotation      Hip external rotation      Knee flexion 4/5 4/5  Knee extension 4/5 4/5  Ankle dorsiflexion NA NA  Ankle plantarflexion NA NA  Ankle inversion NA NA  Ankle eversion NA NA  (Blank rows = not tested)   TRANSFERS: Sit to stand: SBA 22" w/c requiring armrests to RW for support for stabilization with poor technique.  Stand to sit: SBA requires RW support to 22" w/c requiring armrests with poor technique.    GAIT: Gait pattern: step to pattern, decreased step length- Right, decreased stance time- Left, Left hip hike, antalgic, trunk flexed, and wide BOS Distance walked: 12' with 90* turn right & left and 180* turn Assistive device utilized: Environmental consultant - 2 wheeled and TTA prosthesis Level of assistance: Min A esp with turns Comments: excessive BUE weight bearing on RW   FUNCTIONAL TESTs:  Berg Balance Scale: 10/56   [facility] PT Assessment - 07/06/22 1300                Standardized Balance Assessment    Standardized Balance Assessment Berg Balance Test          Berg Balance Test    Sit to Stand Needs minimal aid to stand or to stabilize     Standing Unsupported Needs several tries to stand 30 seconds unsupported     Sitting with Back Unsupported but Feet Supported on Floor or Stool Able to sit safely and  securely 2 minutes     Stand to Sit Sits independently, has uncontrolled descent     Transfers Able to transfer safely, definite need of hands     Standing Unsupported with Eyes Closed Needs help to keep from falling     Standing Unsupported with Feet Together Needs help to attain position and unable to hold for 15 seconds     From Standing, Reach Forward with Outstretched Arm Loses balance while trying/requires external support     From Standing Position, Pick up Object from Floor Unable to try/needs assist to keep balance     From Standing Position, Turn to Look Behind Over each Shoulder Needs assist to keep from losing balance and falling     Turn 360 Degrees Needs assistance while turning     Standing Unsupported, Alternately Place Feet on Step/Stool Needs assistance to keep from falling or unable to try     Standing Unsupported, One Foot in [employer] balance while stepping or standing     Standing on One Leg Unable to try or needs assist to prevent fall     Total Score 10     Berg comment: BERG  < 36 high risk for falls (close to 100%) 46-51 moderate (>50%)   37-45 significant (>80%) 52-55 lower (> 25%)                   CURRENT PROSTHETIC WEAR ASSESSMENT: Patient is dependent with: skin check, residual limb  care, prosthetic cleaning, ply sock cleaning, correct ply sock adjustment, proper wear schedule/adjustment, and proper weight-bearing schedule/adjustment Donning prosthesis: SBA / verbal cues Doffing prosthesis: SBA / verbal cues Prosthetic wear tolerance: liner & flexible suction socket for most of awake hours and wears prostheses maybe ~40% of awake hours,  7 days/week Prosthetic weight bearing tolerance: 5 minutes Edema: none noted Residual limb condition: bilateral redness over patella, dry skin, normal color & temperature.  Prosthetic description: suction pin suspension with 54m silicon liner as interface & flexible inner socket, total contact socket, dynamic response  Flex feet       TODAY'S TREATMENT:                                                                                                                             DATE:  07/15/2022 Prosthetic Training with bilateral Transtibial Prostheses: Sit to/from stand from w/c to RW with supervision. Progressed to 18" chair without armrests to RW - required min guard. Pt amb 746 & 328 with RW supervision with supervision using band for visual cues for step width & step thru.  PT instructed in upright stretch standing back to door frame reaching single & BUEs overhead 2 reps ea 2 deep breath hold. PT demo & verbal cues on neg curb & ramp. Pt neg curb once leading RLE & LLE with RW with minA. Pt neg ramp with RW with modA. PT instructed in walking program of short (room to room) with goal to increase frequency during his day, long (max tolerable distance friend following with w/c) 1-2x/day and medium (distance bw short & long) 4-6x/day. Pt verbalized understanding.  Pt ambulated 175' with RW clinic to his car with supervision.   07/14/22 Sit to stand X 10 using UE support from wheelchair with cushion Balance with feet apart and head/shoulder turns X 10 each Balance on airex pad 20 sec X 5 Gait with RW CGA and cues for upright posture and wider steps 75 feet X 4, then 100 feet X 2 with 180 degree turns at end to sit down to chair. Step ups in //bars onto 4 inch step X 10 bilat Step taps in //bars onto 4 inch step X 10 bilat  07/06/2022 Today's session focused on HEP instruction.  Therapeutic Activities: Pt improved sit to/from stand initial 10 reps from w/c with cushion 22" and then 5 reps from w/c without cushion 19" using sink to stabilize with both.  PT demo & verbal cues on technique with proper weight shift.   Neuromuscular Re-ed: Sink HEP working on standing balance, proprioception using limb / socket interface and pelvic movement for weight shift.    07/08/22 1604  PT Education  Education Details  HEP Access Code: [email or web address] sink program  Person(s) Educated Patient;Other (comment)  Methods Explanation;Demonstration;Tactile cues;Verbal cues;Handout  Comprehension Verbalized understanding;Returned demonstration;Verbal cues required;Tactile cues required;Need further instruction     HOME EXERCISE PROGRAM: Access Code: [email or web address] URL: [email or web address] Date: 07/08/2022 Prepared by:  Jamey Reas  Exercises - Sit to Stand with Counter Support  - 2-3 x daily - 7 x weekly - 1 sets - 10 reps - 5 seconds hold - Seated Hamstring Stretch with Strap  - 2-3 x daily - 7 x weekly - 1 sets - 2-3 reps - 20-30 seconds hold   Do each exercise 1-2  times per day Do each exercise 5-10 repetitions Hold each exercise for 2 seconds to feel your location  AT [place].  Try to find this position when standing still for activities.   USE TAPE ON FLOOR TO MARK THE MIDLINE POSITION which is even with middle of sink.  You also should try to feel with your limb pressure in socket.  You are trying to feel with limb what you used to feel with the bottom of your foot.  Side to Side Shift: Moving your hips only (not shoulders): move weight onto your left leg, HOLD/FEEL pressure in socket.  Move back to equal weight on each leg, HOLD/FEEL pressure in socket. Move weight onto your right leg, HOLD/FEEL pressure in socket. Move back to equal weight on each leg, HOLD/FEEL pressure in socket. Repeat.  Start with both hands on sink, progress to right hand only hold, then left hand only hold  Front to Back Shift: Moving your hips only (not shoulders): move your weight forward onto your toes, HOLD/FEEL pressure in socket. Move your weight back to equal Flat Foot on both legs, HOLD/FEEL  pressure in socket. Move your weight back onto your heels, HOLD/FEEL  pressure in socket. Move your weight back to equal on both legs, HOLD/FEEL  pressure  in socket. Repeat.  Start with both hands on sink, progress to right hand only hold, then left hand only hold  Moving Cones / Cups: With equal weight on each leg: Hold on with one hand the first time, then progress to no hand supports. Move cups from one side of sink to the other. Place cups ~2" out of your reach, progress to 10" beyond reach.  Place one hand in middle of sink and reach with other hand. Do both arms.   Overhead/Upward Reaching: alternated reaching up to top cabinets or ceiling if no cabinets present. Keep equal weight on each leg. Start with one hand support on counter while other hand reaches and progress to no hand support with reaching.  ace one hand in middle of sink and reach with other hand. Do both arms.   5.   Looking Over Shoulders: With equal weight on each leg: alternate turning to look over your shoulders with one hand support on counter as needed.  Start with head motions only to look in front of shoulder, then even with shoulder and progress to looking behind you. To look to side, move head /eyes, then shoulder on side looking pulls back, shift more weight to side looking and pull hip back. Place one hand in middle of sink and let go with other hand so your shoulder can pull back. Switch hands to look other way.   6.  Alternating Stepping or tapping toes into lower cabinet:  Move items under cabinet out of your way. Shift your hips/pelvis so weight on right leg & Tighten muscles in hip on right side.  SLOWLY step left leg so front of foot is in cabinet. Then step back to floor with right hip tight.  Repeat with other leg.  PATIENT EDUCATION: PATIENT EDUCATED ON FOLLOWING PROSTHETIC CARE: Education details:   Skin check, Prosthetic cleaning, and [place]pper donning , positioning in sitting and proper sit to/from stand technique. Person educated: Patient and friend Education method: Explanation and Verbal cues Education comprehension: verbalized understanding, verbal cues  required, and needs further education   HOME EXERCISE PROGRAM:   ASSESSMENT:   CLINICAL IMPRESSION: Pt improved prosthetic gait with RW requiring no assist & increasing distance on level surfaces.  PT introduced ramps & curbs and he appears to have general concept but needs assistance and more practice prior to attempting outside of PT setting.  Pt continues to benefit from skilled PT.    OBJECTIVE IMPAIRMENTS: Abnormal gait, decreased activity tolerance, decreased balance, decreased knowledge of use of DME, decreased mobility, decreased ROM, decreased strength, impaired flexibility, postural dysfunction, prosthetic dependency , and pain.    ACTIVITY LIMITATIONS: carrying, lifting, bending, sitting, standing, squatting, stairs, transfers, and locomotion level   PARTICIPATION LIMITATIONS: meal prep, cleaning, driving, and community activity   PERSONAL FACTORS: Fitness, Time since onset of injury/illness/exacerbation, and 3+ comorbidities: see PMH  are also affecting patient's functional outcome.    REHAB POTENTIAL: Good   CLINICAL DECISION MAKING: Evolving/moderate complexity   EVALUATION COMPLEXITY: Moderate     GOALS: Goals reviewed with patient? Yes   SHORT TERM GOALS: Target date: 08/06/2022   Patient donnes prosthesis modified independent & verbalizes proper cleaning. Baseline: SEE OBJECTIVE DATA Goal status: INITIAL 2.  Patient verbalizes understanding of initial HEP.  Baseline: SEE OBJECTIVE DATA Goal status: INITIAL   3.  Patient able to stand 60 seconds without UE support and reach 5" anteriorly with supervision.  Baseline: SEE OBJECTIVE DATA Goal status: INITIAL   4. Patient ambulates 65' with RW & prosthesis with supervision. Baseline: SEE OBJECTIVE DATA Goal status: INITIAL   5. Patient negotiates ramps & curbs with RW & prosthesis with minA. Baseline: SEE OBJECTIVE DATA Goal status: INITIAL   LONG TERM GOALS: Target date: 10/01/2022   Patient demonstrates  & verbalized understanding of prosthetic care to enable safe utilization of prosthesis. Baseline: SEE OBJECTIVE DATA Goal status: INITIAL   Patient tolerates prostheses wear >90% of awake hours without skin or limb pain issues. Baseline: SEE OBJECTIVE DATA Goal status: INITIAL   Berg Balance >/= 45/56 to indicate lower fall risk Baseline: SEE OBJECTIVE DATA Goal status: INITIAL   Patient ambulates >300' with prostheses and cane or less independently Baseline: SEE OBJECTIVE DATA Goal status: INITIAL   Patient negotiates ramps, curbs & stairs with single rail with prostheses and cane or less independently. Baseline: SEE OBJECTIVE DATA Goal status: INITIAL   6.   Patient verbalizes & demonstrates understanding of how to properly use fitness equipment to return to gym. Baseline: SEE OBJECTIVE DATA Goal status: INITIAL   PLAN:   PT FREQUENCY: 2x/week   PT DURATION: 90 days / 13 weeks   PLANNED INTERVENTIONS: Therapeutic exercises, Therapeutic activity, Neuromuscular re-education, Balance training, Gait training, Patient/Family education, Self Care, Stair training, Prosthetic training, DME instructions, Electrical stimulation, Cryotherapy, Moist heat, and physical performance testing.   PLAN FOR NEXT SESSION:  work on standing balance, Prosthetic gait with RW on ramps & curbs.    Jamey Reas, PT, DPT 07/15/2022, 2:43 PM

## 2022-07-18 DIAGNOSIS — I5022 Chronic systolic (congestive) heart failure: Secondary | ICD-10-CM | POA: Diagnosis not present

## 2022-07-18 DIAGNOSIS — G4700 Insomnia, unspecified: Secondary | ICD-10-CM | POA: Diagnosis not present

## 2022-07-18 DIAGNOSIS — L89154 Pressure ulcer of sacral region, stage 4: Secondary | ICD-10-CM | POA: Diagnosis not present

## 2022-07-18 DIAGNOSIS — E291 Testicular hypofunction: Secondary | ICD-10-CM | POA: Diagnosis not present

## 2022-07-20 ENCOUNTER — Ambulatory Visit (INDEPENDENT_AMBULATORY_CARE_PROVIDER_SITE_OTHER): Payer: Medicare HMO | Admitting: Physical Therapy

## 2022-07-20 ENCOUNTER — Encounter: Payer: Self-pay | Admitting: Physical Therapy

## 2022-07-20 DIAGNOSIS — R2689 Other abnormalities of gait and mobility: Secondary | ICD-10-CM | POA: Diagnosis not present

## 2022-07-20 DIAGNOSIS — R2681 Unsteadiness on feet: Secondary | ICD-10-CM | POA: Diagnosis not present

## 2022-07-20 DIAGNOSIS — M5459 Other low back pain: Secondary | ICD-10-CM | POA: Diagnosis not present

## 2022-07-20 DIAGNOSIS — M6281 Muscle weakness (generalized): Secondary | ICD-10-CM

## 2022-07-20 DIAGNOSIS — Z7409 Other reduced mobility: Secondary | ICD-10-CM | POA: Diagnosis not present

## 2022-07-20 NOTE — Therapy (Signed)
OUTPATIENT PHYSICAL THERAPY TREATMENT NOTE   Patient Name: [name] MRN: [id number] DOB:1966-04-07, 56 y.o., male Today's Date: 07/20/2022  PCP: Loralee Pacas, MD REFERRING PROVIDER: Loralee Pacas, MD  END OF SESSION:   PT End of Session - 07/20/22 1350     Visit Number 5    Number of Visits 26    Date for PT Re-Evaluation 10/01/22    Authorization Type [employer]    Authorization Time Period 2023 OOP & ded MET.    PT Start Time 7741    PT Stop Time 1430    PT Time Calculation (min) 45 min    Equipment Utilized During Treatment Gait belt    Activity Tolerance Patient tolerated treatment well;Patient limited by pain    Behavior During Therapy [facility] for tasks assessed/performed              Past Medical History:  Diagnosis Date   Acquired complex renal cyst 09/25/2019   AKI (acute kidney injury) ([place]) 07/22/2021   Cancer ([place])    renal   Chest pain 28/78/6767   Complication of anesthesia    Decubitus ulcer of sacral area 12/15/2021   Depression    Diabetes mellitus without complication ([place])    Healthcare maintenance 09/25/2019   Hematuria 09/25/2019   History of kidney stones    Lactic acidosis 12/15/2021   Leukocytosis 09/13/2021   Osteomyelitis ([place])    Sacral pressure injury of skin 07/27/2021   Septic shock ([place]) 07/22/2021   Severe sepsis with acute organ dysfunction ([place]) 07/22/2021   Shortness of breath 05/16/2021   Stage 3a chronic kidney disease ([place]) 20/94/7096   Systolic dysfunction    Type 2 diabetes mellitus 10/12/2019   Past Surgical History:  Procedure Laterality Date   AMPUTATION Left 07/23/2021   Procedure: LEFT BELOW KNEE AMPUTATION;  Surgeon: [name], MD;  Location: [place];  Service: Orthopedics;  Laterality: Left;   BELOW KNEE LEG AMPUTATION Right    BUBBLE STUDY  07/29/2021   Procedure: BUBBLE STUDY;  Surgeon: Sueanne Margarita, MD;  Location: [place];  Service: Cardiovascular;;   CARDIOVERSION N/A 07/29/2021    Procedure: CARDIOVERSION;  Surgeon: Sueanne Margarita, MD;  Location: [facility] ENDOSCOPY;  Service: Cardiovascular;  Laterality: N/A;   RADIOLOGY WITH ANESTHESIA N/A 08/05/2021   Procedure: MRI LUMBAR WITH AND WITHOUT; THORASIC SPINE WITH AND WITHOUT WITH ANESTHESIA;  Surgeon: Radiologist, Medication, MD;  Location: [place];  Service: Radiology;  Laterality: N/A;   RADIOLOGY WITH ANESTHESIA N/A 08/07/2021   Procedure: MRI WITH LUMBER WITH AND WITHOUT CONTRAST,THORACIC WITH AND WITHOUT CONTRAST;  Surgeon: Radiologist, Medication, MD;  Location: [place];  Service: Radiology;  Laterality: N/A;   RADIOLOGY WITH ANESTHESIA N/A 09/13/2021   Procedure: MRI WITH ANESTHESIA;  Surgeon: Luanne Bras, MD;  Location: [place];  Service: Radiology;  Laterality: N/A;   TEE WITHOUT CARDIOVERSION N/A 07/29/2021   Procedure: TRANSESOPHAGEAL ECHOCARDIOGRAM (TEE);  Surgeon: Sueanne Margarita, MD;  Location: [place];  Service: Cardiovascular;  Laterality: N/A;   Patient Active Problem List   Diagnosis Date Noted   Malaise 06/22/2022   Polycythemia 06/22/2022   Amputation below knee ([place]) 04/23/2022   PTSD (post-traumatic stress disorder) 04/23/2022   Generalized anxiety disorder 01/21/2022   Infected ulcer of skin ([place]) 12/29/2021   Episode of unresponsiveness 12/15/2021   Acute respiratory failure with hypoxia (HCC) secondary to suspected aspiration pneumonia 12/15/2021   Renal mass 28/36/6294   Complicated UTI (urinary tract infection) 12/15/2021   Stage  IV pressure ulcer of sacral region ([place]) 09/15/2021   Insomnia 95/62/1308   Chronic systolic CHF (congestive heart failure) ([place]) 09/14/2021   Hypogonadism in male 09/14/2021   Chronic pain 09/14/2021   Pseudohyponatremia 09/13/2021   Normocytic anemia 09/13/2021   Discitis of thoracic region    Gas gangrene of foot ([place]) 07/22/2021   Paroxysmal atrial flutter ([place]) 07/22/2021   Chronic diastolic (congestive) heart failure ([place]) 05/16/2021   Diabetic infection  of left foot ([place]) 04/17/2021   Lumbar spondylosis 03/25/2021   H/O amputation of leg through tibia and fibula ([place]) 11/10/2019   Degeneration of lumbar intervertebral disc 11/10/2019   Hypercholesterolemia 11/10/2019   Left below-knee amputee ([place]) 10/30/2019   Dyslipidemia 10/30/2019   Type 2 diabetes mellitus with diabetic polyneuropathy, with long-term current use of insulin ([place]) 10/12/2019   Right below-elbow amputee 10/12/2019   Hypertensive disorder 09/25/2019   Type 2 diabetes mellitus with hyperglycemia, with long-term current use of insulin ([place]) 09/25/2019   Urinary hesitancy 09/25/2019   Prosthesis adjustments 09/25/2019   Erectile dysfunction 09/25/2019   Palliative care status 09/25/2019   Pulmonary nodules 09/25/2019   Depressive disorder 09/25/2019    REFERRING DIAG: G89.4 (ICD-10-CM) - Chronic pain syndrome   Z89.512 Left Below Knee Amputation, Z89.511 history of right Below Knee Amputation.  THERAPY DIAG:  Unsteadiness on feet  Other abnormalities of gait and mobility  Muscle weakness (generalized)  Other low back pain  Impaired functional mobility and activity tolerance  Rationale for Evaluation and Treatment Rehabilitation  PERTINENT HISTORY:  DM2, CHF, HTN, chronic pain, bilateral TTAs  PRECAUTIONS: Fall  SUBJECTIVE:                                                                                                                                                                                      SUBJECTIVE STATEMENT: He did a long walk with 2 standing rests that he thinks took ~33mn. He had back soreness.   PAIN:  Are you having pain? Are you having pain? Yes: NPRS scale: today 7/10 and in last week ranging 5-7/10 Pain location: middle low back Pain description: sharp Aggravating factors: extension Relieving factors: sitting forward,   OBJECTIVE: (objective measures completed at initial evaluation unless otherwise dated)   OBJECTIVE:   COGNITION: Overall cognitive status: Within functional limits for tasks assessed   POSTURE: rounded shoulders, forward head, flexed trunk , and weight shift left   LOWER EXTREMITY ROM:   ROM P:passive  A:active Right eval Left eval  Hip flexion      Hip extension Standing A: -15* Standing A: -15*  Hip abduction      Hip adduction  Hip internal rotation      Hip external rotation      Knee flexion      Knee extension      Ankle dorsiflexion      Ankle plantarflexion      Ankle inversion      Ankle eversion       (Blank rows = not tested)   LOWER EXTREMITY MMT:   MMT Right eval Left eval  Hip flexion 4/5 4/5  Hip extension 3-/5 3-/5  Hip abduction 3/5 3/5  Hip adduction      Hip internal rotation      Hip external rotation      Knee flexion 4/5 4/5  Knee extension 4/5 4/5  Ankle dorsiflexion NA NA  Ankle plantarflexion NA NA  Ankle inversion NA NA  Ankle eversion NA NA  (Blank rows = not tested)   TRANSFERS: Sit to stand: SBA 22" w/c requiring armrests to RW for support for stabilization with poor technique.  Stand to sit: SBA requires RW support to 22" w/c requiring armrests with poor technique.    GAIT: Gait pattern: step to pattern, decreased step length- Right, decreased stance time- Left, Left hip hike, antalgic, trunk flexed, and wide BOS Distance walked: 65' with 90* turn right & left and 180* turn Assistive device utilized: Environmental consultant - 2 wheeled and TTA prosthesis Level of assistance: Min A esp with turns Comments: excessive BUE weight bearing on RW   FUNCTIONAL TESTs:  Berg Balance Scale: 10/56   [facility] PT Assessment - 07/06/22 1300                Standardized Balance Assessment    Standardized Balance Assessment Berg Balance Test          Berg Balance Test    Sit to Stand Needs minimal aid to stand or to stabilize     Standing Unsupported Needs several tries to stand 30 seconds unsupported     Sitting with Back Unsupported but Feet  Supported on Floor or Stool Able to sit safely and securely 2 minutes     Stand to Sit Sits independently, has uncontrolled descent     Transfers Able to transfer safely, definite need of hands     Standing Unsupported with Eyes Closed Needs help to keep from falling     Standing Unsupported with Feet Together Needs help to attain position and unable to hold for 15 seconds     From Standing, Reach Forward with Outstretched Arm Loses balance while trying/requires external support     From Standing Position, Pick up Object from Floor Unable to try/needs assist to keep balance     From Standing Position, Turn to Look Behind Over each Shoulder Needs assist to keep from losing balance and falling     Turn 360 Degrees Needs assistance while turning     Standing Unsupported, Alternately Place Feet on Step/Stool Needs assistance to keep from falling or unable to try     Standing Unsupported, One Foot in [employer] balance while stepping or standing     Standing on One Leg Unable to try or needs assist to prevent fall     Total Score 10     Berg comment: BERG  < 36 high risk for falls (close to 100%) 46-51 moderate (>50%)   37-45 significant (>80%) 52-55 lower (> 25%)                   CURRENT PROSTHETIC WEAR ASSESSMENT:  Patient is dependent with: skin check, residual limb care, prosthetic cleaning, ply sock cleaning, correct ply sock adjustment, proper wear schedule/adjustment, and proper weight-bearing schedule/adjustment Donning prosthesis: SBA / verbal cues Doffing prosthesis: SBA / verbal cues Prosthetic wear tolerance: liner & flexible suction socket for most of awake hours and wears prostheses maybe ~40% of awake hours,  7 days/week Prosthetic weight bearing tolerance: 5 minutes Edema: none noted Residual limb condition: bilateral redness over patella, dry skin, normal color & temperature.  Prosthetic description: suction pin suspension with 73m silicon liner as interface & flexible inner  socket, total contact socket, dynamic response Flex feet       TODAY'S TREATMENT:                                                                                                                             DATE:  07/20/2022 Prosthetic Training with bilateral Transtibial Prostheses: Physical therapist reviewed proper donning with standing so residual limb is fully seated into the socket. patient use posture with posterior pelvis against a wall in order to maintain balance with this activity. patient verbalized understanding of why he needed to donne this way. patient has a small red area on his right medial hamstring tendon. physical therapist recommended using mineral oil prior to donning prosthesis and patient verbalized understanding. PT also made patient aware of the fact that the wrinkles in the suction suspension sleeve could cause the abrasion or irritation.  PT reviewed walking program with short medium and long walks.  Patient verbalized understanding and better knowledge of long walks. PT reviewed standing exercises with put back to door frame and back to countertop to work on upright posture.  Patient verbalized understanding PT reviewed with demo and verbal cues on proper negotiation of ramps and curbs.  Patient able to negotiate both ramps and cards with a rolling walker with supervision to contact-guard assistance. -Patient able to go sit to and from stand from chairs without armrest using upper extremities to the rolling walker with verbal cues.  07/15/2022 Prosthetic Training with bilateral Transtibial Prostheses: Sit to/from stand from w/c to RW with supervision. Progressed to 18" chair without armrests to RW - required min guard. Pt amb 726 & 325 with RW supervision with supervision using band for visual cues for step width & step thru.  PT instructed in upright stretch standing back to door frame reaching single & BUEs overhead 2 reps ea 2 deep breath hold. PT demo & verbal cues on  neg curb & ramp. Pt neg curb once leading RLE & LLE with RW with minA. Pt neg ramp with RW with modA. PT instructed in walking program of short (room to room) with goal to increase frequency during his day, long (max tolerable distance friend following with w/c) 1-2x/day and medium (distance bw short & long) 4-6x/day. Pt verbalized understanding.  Pt ambulated 175' with RW clinic to his car with supervision.   07/14/22 Sit to stand X 10  using UE support from wheelchair with cushion Balance with feet apart and head/shoulder turns X 10 each Balance on airex pad 20 sec X 5 Gait with RW CGA and cues for upright posture and wider steps 75 feet X 4, then 100 feet X 2 with 180 degree turns at end to sit down to chair. Step ups in //bars onto 4 inch step X 10 bilat Step taps in //bars onto 4 inch step X 10 bilat    07/08/22 1604  PT Education  Education Details HEP Access Code: [email or web address] and sink program  Person(s) Educated Patient;Other (comment)  Methods Explanation;Demonstration;Tactile cues;Verbal cues;Handout  Comprehension Verbalized understanding;Returned demonstration;Verbal cues required;Tactile cues required;Need further instruction     HOME EXERCISE PROGRAM: Access Code: [email or web address] URL: [email or web address] Date: 07/08/2022 Prepared by: Jamey Reas  Exercises - Sit to Stand with Counter Support  - 2-3 x daily - 7 x weekly - 1 sets - 10 reps - 5 seconds hold - Seated Hamstring Stretch with Strap  - 2-3 x daily - 7 x weekly - 1 sets - 2-3 reps - 20-30 seconds hold   Do each exercise 1-2  times per day Do each exercise 5-10 repetitions Hold each exercise for 2 seconds to feel your location  AT [place].  Try to find this position when standing still for activities.   USE TAPE ON FLOOR TO MARK THE MIDLINE POSITION which is even with middle of sink.  You also should try to feel with your limb  pressure in socket.  You are trying to feel with limb what you used to feel with the bottom of your foot.  Side to Side Shift: Moving your hips only (not shoulders): move weight onto your left leg, HOLD/FEEL pressure in socket.  Move back to equal weight on each leg, HOLD/FEEL pressure in socket. Move weight onto your right leg, HOLD/FEEL pressure in socket. Move back to equal weight on each leg, HOLD/FEEL pressure in socket. Repeat.  Start with both hands on sink, progress to right hand only hold, then left hand only hold  Front to Back Shift: Moving your hips only (not shoulders): move your weight forward onto your toes, HOLD/FEEL pressure in socket. Move your weight back to equal Flat Foot on both legs, HOLD/FEEL  pressure in socket. Move your weight back onto your heels, HOLD/FEEL  pressure in socket. Move your weight back to equal on both legs, HOLD/FEEL  pressure in socket. Repeat.  Start with both hands on sink, progress to right hand only hold, then left hand only hold  Moving Cones / Cups: With equal weight on each leg: Hold on with one hand the first time, then progress to no hand supports. Move cups from one side of sink to the other. Place cups ~2" out of your reach, progress to 10" beyond reach.  Place one hand in middle of sink and reach with other hand. Do both arms.   Overhead/Upward Reaching: alternated reaching up to top cabinets or ceiling if no cabinets present. Keep equal weight on each leg. Start with one hand support on counter while other hand reaches and progress to no hand support with reaching.  ace one hand in middle of sink and reach with other hand. Do both arms.   5.   Looking Over Shoulders: With equal weight on each leg: alternate turning to look over your shoulders with one hand support on counter as needed.  Start  with head motions only to look in front of shoulder, then even with shoulder and progress to looking behind you. To look to side, move head /eyes, then shoulder on  side looking pulls back, shift more weight to side looking and pull hip back. Place one hand in middle of sink and let go with other hand so your shoulder can pull back. Switch hands to look other way.   6.  Alternating Stepping or tapping toes into lower cabinet:  Move items under cabinet out of your way. Shift your hips/pelvis so weight on right leg & Tighten muscles in hip on right side.  SLOWLY step left leg so front of foot is in cabinet. Then step back to floor with right hip tight.  Repeat with other leg.    PATIENT EDUCATION: PATIENT EDUCATED ON FOLLOWING PROSTHETIC CARE: Education details:   Skin check, Prosthetic cleaning, and [place]pper donning , positioning in sitting and proper sit to/from stand technique. Person educated: Patient and friend Education method: Explanation and Verbal cues Education comprehension: verbalized understanding, verbal cues required, and needs further education   HOME EXERCISE PROGRAM:   ASSESSMENT:   CLINICAL IMPRESSION: Patient improved standing & gait with RW including ramps & curbs.  He will need to pace increase in activities to limit excerebration of back pain.  Pt continues to benefit from skilled PT.    OBJECTIVE IMPAIRMENTS: Abnormal gait, decreased activity tolerance, decreased balance, decreased knowledge of use of DME, decreased mobility, decreased ROM, decreased strength, impaired flexibility, postural dysfunction, prosthetic dependency , and pain.    ACTIVITY LIMITATIONS: carrying, lifting, bending, sitting, standing, squatting, stairs, transfers, and locomotion level   PARTICIPATION LIMITATIONS: meal prep, cleaning, driving, and community activity   PERSONAL FACTORS: Fitness, Time since onset of injury/illness/exacerbation, and 3+ comorbidities: see PMH  are also affecting patient's functional outcome.    REHAB POTENTIAL: Good   CLINICAL DECISION MAKING: Evolving/moderate complexity   EVALUATION COMPLEXITY: Moderate     GOALS: Goals  reviewed with patient? Yes   SHORT TERM GOALS: Target date: 08/06/2022   Patient donnes prosthesis modified independent & verbalizes proper cleaning. Baseline: SEE OBJECTIVE DATA Goal status: INITIAL 2.  Patient verbalizes understanding of initial HEP.  Baseline: SEE OBJECTIVE DATA Goal status: INITIAL   3.  Patient able to stand 60 seconds without UE support and reach 5" anteriorly with supervision.  Baseline: SEE OBJECTIVE DATA Goal status: INITIAL   4. Patient ambulates 63' with RW & prosthesis with supervision. Baseline: SEE OBJECTIVE DATA Goal status: INITIAL   5. Patient negotiates ramps & curbs with RW & prosthesis with minA. Baseline: SEE OBJECTIVE DATA Goal status: INITIAL   LONG TERM GOALS: Target date: 10/01/2022   Patient demonstrates & verbalized understanding of prosthetic care to enable safe utilization of prosthesis. Baseline: SEE OBJECTIVE DATA Goal status: INITIAL   Patient tolerates prostheses wear >90% of awake hours without skin or limb pain issues. Baseline: SEE OBJECTIVE DATA Goal status: INITIAL   Berg Balance >/= 45/56 to indicate lower fall risk Baseline: SEE OBJECTIVE DATA Goal status: INITIAL   Patient ambulates >300' with prostheses and cane or less independently Baseline: SEE OBJECTIVE DATA Goal status: INITIAL   Patient negotiates ramps, curbs & stairs with single rail with prostheses and cane or less independently. Baseline: SEE OBJECTIVE DATA Goal status: INITIAL   6.   Patient verbalizes & demonstrates understanding of how to properly use fitness equipment to return to gym. Baseline: SEE OBJECTIVE DATA Goal status: INITIAL   PLAN:  PT FREQUENCY: 2x/week   PT DURATION: 90 days / 13 weeks   PLANNED INTERVENTIONS: Therapeutic exercises, Therapeutic activity, Neuromuscular re-education, Balance training, Gait training, Patient/Family education, Self Care, Stair training, Prosthetic training, DME instructions, Electrical  stimulation, Cryotherapy, Moist heat, and physical performance testing.   PLAN FOR NEXT SESSION:    work on standing balance facilitating ankle/residual limb & hip strategies, Prosthetic gait with RW on ramps & curbs. Introduce stairs.    Jamey Reas, PT, DPT 07/20/2022, 3:43 PM

## 2022-07-22 ENCOUNTER — Encounter: Payer: Medicare HMO | Admitting: Physical Therapy

## 2022-07-22 NOTE — Therapy (Incomplete)
OUTPATIENT PHYSICAL THERAPY TREATMENT NOTE   Patient Name: [name] MRN: [id number] DOB:[birth year], 56 y.o., male Today's Date: 07/22/2022  PCP: Loralee Pacas, MD REFERRING PROVIDER: Loralee Pacas, MD  END OF SESSION:      Past Medical History:  Diagnosis Date   Acquired complex renal cyst 09/25/2019   AKI (acute kidney injury) ([place]) 07/22/2021   Cancer ([place])    renal   Chest pain 67/20/9470   Complication of anesthesia    Decubitus ulcer of sacral area 12/15/2021   Depression    Diabetes mellitus without complication ([place])    Healthcare maintenance 09/25/2019   Hematuria 09/25/2019   History of kidney stones    Lactic acidosis 12/15/2021   Leukocytosis 09/13/2021   Osteomyelitis ([place])    Sacral pressure injury of skin 07/27/2021   Septic shock ([place]) 07/22/2021   Severe sepsis with acute organ dysfunction ([place]) 07/22/2021   Shortness of breath 05/16/2021   Stage 3a chronic kidney disease ([place]) 96/28/3662   Systolic dysfunction    Type 2 diabetes mellitus 10/12/2019   Past Surgical History:  Procedure Laterality Date   AMPUTATION Left 07/23/2021   Procedure: LEFT BELOW KNEE AMPUTATION;  Surgeon: [name], MD;  Location: [facility];  Service: Orthopedics;  Laterality: Left;   BELOW KNEE LEG AMPUTATION Right    BUBBLE STUDY  07/29/2021   Procedure: BUBBLE STUDY;  Surgeon: Sueanne Margarita, MD;  Location: [facility];  Service: Cardiovascular;;   CARDIOVERSION N/A 07/29/2021   Procedure: CARDIOVERSION;  Surgeon: Sueanne Margarita, MD;  Location: [facility] ENDOSCOPY;  Service: Cardiovascular;  Laterality: N/A;   RADIOLOGY WITH ANESTHESIA N/A 08/05/2021   Procedure: MRI LUMBAR WITH AND WITHOUT; THORASIC SPINE WITH AND WITHOUT WITH ANESTHESIA;  Surgeon: Radiologist, Medication, MD;  Location: [facility];  Service: Radiology;  Laterality: N/A;   RADIOLOGY WITH ANESTHESIA N/A 08/07/2021   Procedure: MRI WITH LUMBER WITH AND WITHOUT CONTRAST,THORACIC WITH AND WITHOUT  CONTRAST;  Surgeon: Radiologist, Medication, MD;  Location: [facility];  Service: Radiology;  Laterality: N/A;   RADIOLOGY WITH ANESTHESIA N/A 09/13/2021   Procedure: MRI WITH ANESTHESIA;  Surgeon: Luanne Bras, MD;  Location: [facility];  Service: Radiology;  Laterality: N/A;   TEE WITHOUT CARDIOVERSION N/A 07/29/2021   Procedure: TRANSESOPHAGEAL ECHOCARDIOGRAM (TEE);  Surgeon: Sueanne Margarita, MD;  Location: [facility];  Service: Cardiovascular;  Laterality: N/A;   Patient Active Problem List   Diagnosis Date Noted   Malaise 06/22/2022   Polycythemia 06/22/2022   Amputation below knee ([place]) 04/23/2022   PTSD (post-traumatic stress disorder) 04/23/2022   Generalized anxiety disorder 01/21/2022   Infected ulcer of skin ([place]) 12/29/2021   Episode of unresponsiveness 12/15/2021   Acute respiratory failure with hypoxia (H[facility]) secondary to suspected aspiration pneumonia 12/15/2021   Renal mass 94/76/5465   Complicated UTI (urinary tract infection) 12/15/2021   Stage IV pressure ulcer of sacral region ([place]) 09/15/2021   Insomnia 03/54/6568   Chronic systolic CHF (congestive heart failure) ([place]) 09/14/2021   Hypogonadism in male 09/14/2021   Chronic pain 09/14/2021   Pseudohyponatremia 09/13/2021   Normocytic anemia 09/13/2021   Discitis of thoracic region    Gas gangrene of foot ([place]) 07/22/2021   Paroxysmal atrial flutter ([place]) 07/22/2021   Chronic diastolic (congestive) heart failure ([place]) 05/16/2021   Diabetic infection of left foot ([place]) 04/17/2021   Lumbar spondylosis 03/25/2021   H/O amputation of leg through tibia and fibula ([place]) 11/10/2019   Degeneration of lumbar intervertebral disc 11/10/2019   Hypercholesterolemia  11/10/2019   Left below-knee amputee ([place]) 10/30/2019   Dyslipidemia 10/30/2019   Type 2 diabetes mellitus with diabetic polyneuropathy, with long-term current use of insulin ([place]) 10/12/2019   Right below-elbow amputee 10/12/2019   Hypertensive disorder 09/25/2019    Type 2 diabetes mellitus with hyperglycemia, with long-term current use of insulin ([place]) 09/25/2019   Urinary hesitancy 09/25/2019   Prosthesis adjustments 09/25/2019   Erectile dysfunction 09/25/2019   Palliative care status 09/25/2019   Pulmonary nodules 09/25/2019   Depressive disorder 09/25/2019    REFERRING DIAG: G89.4 (ICD-10-CM) - Chronic pain syndrome   Z89.512 Left Below Knee Amputation, Z89.511 history of right Below Knee Amputation.  THERAPY DIAG:  No diagnosis found.  Rationale for Evaluation and Treatment Rehabilitation  PERTINENT HISTORY:  DM2, CHF, HTN, chronic pain, bilateral TTAs  PRECAUTIONS: Fall  SUBJECTIVE:                                                                                                                                                                                      SUBJECTIVE STATEMENT: *** He did a long walk with 2 standing rests that he thinks took ~85mn. He had back soreness.   PAIN:  Are you having pain? Are you having pain? Yes: NPRS scale: today *** 7/10 and in last week ranging 5-7/10 Pain location: middle low back Pain description: sharp Aggravating factors: extension Relieving factors: sitting forward,   OBJECTIVE: (objective measures completed at initial evaluation unless otherwise dated)   OBJECTIVE:  COGNITION: Overall cognitive status: Within functional limits for tasks assessed   POSTURE: rounded shoulders, forward head, flexed trunk , and weight shift left   LOWER EXTREMITY ROM:   ROM P:passive  A:active Right eval Left eval  Hip flexion      Hip extension Standing A: -15* Standing A: -15*  Hip abduction      Hip adduction      Hip internal rotation      Hip external rotation      Knee flexion      Knee extension      Ankle dorsiflexion      Ankle plantarflexion      Ankle inversion      Ankle eversion       (Blank rows = not tested)   LOWER EXTREMITY MMT:   MMT Right eval Left eval  Hip  flexion 4/5 4/5  Hip extension 3-/5 3-/5  Hip abduction 3/5 3/5  Hip adduction      Hip internal rotation      Hip external rotation      Knee flexion 4/5 4/5  Knee extension 4/5 4/5  Ankle dorsiflexion NA NA  Ankle plantarflexion NA NA  Ankle inversion NA NA  Ankle eversion NA NA  (Blank rows = not tested)   TRANSFERS: Sit to stand: SBA 22" w/c requiring armrests to RW for support for stabilization with poor technique.  Stand to sit: SBA requires RW support to 22" w/c requiring armrests with poor technique.    GAIT: Gait pattern: step to pattern, decreased step length- Right, decreased stance time- Left, Left hip hike, antalgic, trunk flexed, and wide BOS Distance walked: 81' with 90* turn right & left and 180* turn Assistive device utilized: Environmental consultant - 2 wheeled and TTA prosthesis Level of assistance: Min A esp with turns Comments: excessive BUE weight bearing on RW   FUNCTIONAL TESTs:  Berg Balance Scale: 10/56   [facility] PT Assessment - 07/06/22 1300                Standardized Balance Assessment    Standardized Balance Assessment Berg Balance Test          Berg Balance Test    Sit to Stand Needs minimal aid to stand or to stabilize     Standing Unsupported Needs several tries to stand 30 seconds unsupported     Sitting with Back Unsupported but Feet Supported on Floor or Stool Able to sit safely and securely 2 minutes     Stand to Sit Sits independently, has uncontrolled descent     Transfers Able to transfer safely, definite need of hands     Standing Unsupported with Eyes Closed Needs help to keep from falling     Standing Unsupported with Feet Together Needs help to attain position and unable to hold for 15 seconds     From Standing, Reach Forward with Outstretched Arm Loses balance while trying/requires external support     From Standing Position, Pick up Object from Floor Unable to try/needs assist to keep balance     From Standing Position, Turn to Look Behind Over  each Shoulder Needs assist to keep from losing balance and falling     Turn 360 Degrees Needs assistance while turning     Standing Unsupported, Alternately Place Feet on Step/Stool Needs assistance to keep from falling or unable to try     Standing Unsupported, One Foot in [employer] balance while stepping or standing     Standing on One Leg Unable to try or needs assist to prevent fall     Total Score 10     Berg comment: BERG  < 36 high risk for falls (close to 100%) 46-51 moderate (>50%)   37-45 significant (>80%) 52-55 lower (> 25%)                   CURRENT PROSTHETIC WEAR ASSESSMENT: Patient is dependent with: skin check, residual limb care, prosthetic cleaning, ply sock cleaning, correct ply sock adjustment, proper wear schedule/adjustment, and proper weight-bearing schedule/adjustment Donning prosthesis: SBA / verbal cues Doffing prosthesis: SBA / verbal cues Prosthetic wear tolerance: liner & flexible suction socket for most of awake hours and wears prostheses maybe ~40% of awake hours,  7 days/week Prosthetic weight bearing tolerance: 5 minutes Edema: none noted Residual limb condition: bilateral redness over patella, dry skin, normal color & temperature.  Prosthetic description: suction pin suspension with 27m silicon liner as interface & flexible inner socket, total contact socket, dynamic response Flex feet       TODAY'S TREATMENT:  DATE:  07/22/2022 ***  07/20/2022 Prosthetic Training with bilateral Transtibial Prostheses: Physical therapist reviewed proper donning with standing so residual limb is fully seated into the socket. patient use posture with posterior pelvis against a wall in order to maintain balance with this activity. patient verbalized understanding of why he needed to donne this way. patient has a small red area on his right medial  hamstring tendon. physical therapist recommended using mineral oil prior to donning prosthesis and patient verbalized understanding. PT also made patient aware of the fact that the wrinkles in the suction suspension sleeve could cause the abrasion or irritation.  PT reviewed walking program with short medium and long walks.  Patient verbalized understanding and better knowledge of long walks. PT reviewed standing exercises with put back to door frame and back to countertop to work on upright posture.  Patient verbalized understanding PT reviewed with demo and verbal cues on proper negotiation of ramps and curbs.  Patient able to negotiate both ramps and cards with a rolling walker with supervision to contact-guard assistance. -Patient able to go sit to and from stand from chairs without armrest using upper extremities to the rolling walker with verbal cues.  07/15/2022 Prosthetic Training with bilateral Transtibial Prostheses: Sit to/from stand from w/c to RW with supervision. Progressed to 18" chair without armrests to RW - required min guard. Pt amb 63' & 31' with RW supervision with supervision using band for visual cues for step width & step thru.  PT instructed in upright stretch standing back to door frame reaching single & BUEs overhead 2 reps ea 2 deep breath hold. PT demo & verbal cues on neg curb & ramp. Pt neg curb once leading RLE & LLE with RW with minA. Pt neg ramp with RW with modA. PT instructed in walking program of short (room to room) with goal to increase frequency during his day, long (max tolerable distance friend following with w/c) 1-2x/day and medium (distance bw short & long) 4-6x/day. Pt verbalized understanding.  Pt ambulated 175' with RW clinic to his car with supervision.      07/08/22 1604  PT Education  Education Details HEP Access Code: [email or web address] and sink program  Person(s) Educated Patient;Other (comment)  Methods Explanation;Demonstration;Tactile cues;Verbal  cues;Handout  Comprehension Verbalized understanding;Returned demonstration;Verbal cues required;Tactile cues required;Need further instruction     HOME EXERCISE PROGRAM: Access Code: [email or web address] URL: [email or web address] Date: 07/08/2022 Prepared by: Jamey Reas  Exercises - Sit to Stand with Counter Support  - 2-3 x daily - 7 x weekly - 1 sets - 10 reps - 5 seconds hold - Seated Hamstring Stretch with Strap  - 2-3 x daily - 7 x weekly - 1 sets - 2-3 reps - 20-30 seconds hold   Do each exercise 1-2  times per day Do each exercise 5-10 repetitions Hold each exercise for 2 seconds to feel your location  AT [place].  Try to find this position when standing still for activities.   USE TAPE ON FLOOR TO MARK THE MIDLINE POSITION which is even with middle of sink.  You also should try to feel with your limb pressure in socket.  You are trying to feel with limb what you used to feel with the bottom of your foot.  Side to Side Shift: Moving your hips only (not shoulders): move weight onto your left leg, HOLD/FEEL pressure in socket.  Move back to equal weight on each leg,  HOLD/FEEL pressure in socket. Move weight onto your right leg, HOLD/FEEL pressure in socket. Move back to equal weight on each leg, HOLD/FEEL pressure in socket. Repeat.  Start with both hands on sink, progress to right hand only hold, then left hand only hold  Front to Back Shift: Moving your hips only (not shoulders): move your weight forward onto your toes, HOLD/FEEL pressure in socket. Move your weight back to equal Flat Foot on both legs, HOLD/FEEL  pressure in socket. Move your weight back onto your heels, HOLD/FEEL  pressure in socket. Move your weight back to equal on both legs, HOLD/FEEL  pressure in socket. Repeat.  Start with both hands on sink, progress to right hand only hold, then left hand only hold  Moving Cones / Cups: With equal  weight on each leg: Hold on with one hand the first time, then progress to no hand supports. Move cups from one side of sink to the other. Place cups ~2" out of your reach, progress to 10" beyond reach.  Place one hand in middle of sink and reach with other hand. Do both arms.   Overhead/Upward Reaching: alternated reaching up to top cabinets or ceiling if no cabinets present. Keep equal weight on each leg. Start with one hand support on counter while other hand reaches and progress to no hand support with reaching.  ace one hand in middle of sink and reach with other hand. Do both arms.   5.   Looking Over Shoulders: With equal weight on each leg: alternate turning to look over your shoulders with one hand support on counter as needed.  Start with head motions only to look in front of shoulder, then even with shoulder and progress to looking behind you. To look to side, move head /eyes, then shoulder on side looking pulls back, shift more weight to side looking and pull hip back. Place one hand in middle of sink and let go with other hand so your shoulder can pull back. Switch hands to look other way.   6.  Alternating Stepping or tapping toes into lower cabinet:  Move items under cabinet out of your way. Shift your hips/pelvis so weight on right leg & Tighten muscles in hip on right side.  SLOWLY step left leg so front of foot is in cabinet. Then step back to floor with right hip tight.  Repeat with other leg.    PATIENT EDUCATION: PATIENT EDUCATED ON FOLLOWING PROSTHETIC CARE: Education details:   Skin check, Prosthetic cleaning, and Propper donning , positioning in sitting and proper sit to/from stand technique. Person educated: Patient and friend Education method: Explanation and Verbal cues Education comprehension: verbalized understanding, verbal cues required, and needs further education   HOME EXERCISE PROGRAM:   ASSESSMENT:   CLINICAL IMPRESSION: *** Patient improved standing & gait with  RW including ramps & curbs.  He will need to pace increase in activities to limit excerebration of back pain.  Pt continues to benefit from skilled PT.    OBJECTIVE IMPAIRMENTS: Abnormal gait, decreased activity tolerance, decreased balance, decreased knowledge of use of DME, decreased mobility, decreased ROM, decreased strength, impaired flexibility, postural dysfunction, prosthetic dependency , and pain.    ACTIVITY LIMITATIONS: carrying, lifting, bending, sitting, standing, squatting, stairs, transfers, and locomotion level   PARTICIPATION LIMITATIONS: meal prep, cleaning, driving, and community activity   PERSONAL FACTORS: Fitness, Time since onset of injury/illness/exacerbation, and 3+ comorbidities: see PMH  are also affecting patient's functional outcome.    REHAB  POTENTIAL: Good   CLINICAL DECISION MAKING: Evolving/moderate complexity   EVALUATION COMPLEXITY: Moderate     GOALS: Goals reviewed with patient? Yes   SHORT TERM GOALS: Target date: 08/06/2022   Patient donnes prosthesis modified independent & verbalizes proper cleaning. Baseline: SEE OBJECTIVE DATA Goal status: INITIAL 2.  Patient verbalizes understanding of initial HEP.  Baseline: SEE OBJECTIVE DATA Goal status: INITIAL   3.  Patient able to stand 60 seconds without UE support and reach 5" anteriorly with supervision.  Baseline: SEE OBJECTIVE DATA Goal status: INITIAL   4. Patient ambulates 40' with RW & prosthesis with supervision. Baseline: SEE OBJECTIVE DATA Goal status: INITIAL   5. Patient negotiates ramps & curbs with RW & prosthesis with minA. Baseline: SEE OBJECTIVE DATA Goal status: INITIAL   LONG TERM GOALS: Target date: 10/01/2022   Patient demonstrates & verbalized understanding of prosthetic care to enable safe utilization of prosthesis. Baseline: SEE OBJECTIVE DATA Goal status: INITIAL   Patient tolerates prostheses wear >90% of awake hours without skin or limb pain issues. Baseline:  SEE OBJECTIVE DATA Goal status: INITIAL   Berg Balance >/= 45/56 to indicate lower fall risk Baseline: SEE OBJECTIVE DATA Goal status: INITIAL   Patient ambulates >300' with prostheses and cane or less independently Baseline: SEE OBJECTIVE DATA Goal status: INITIAL   Patient negotiates ramps, curbs & stairs with single rail with prostheses and cane or less independently. Baseline: SEE OBJECTIVE DATA Goal status: INITIAL   6.   Patient verbalizes & demonstrates understanding of how to properly use fitness equipment to return to gym. Baseline: SEE OBJECTIVE DATA Goal status: INITIAL   PLAN:   PT FREQUENCY: 2x/week   PT DURATION: 90 days / 13 weeks   PLANNED INTERVENTIONS: Therapeutic exercises, Therapeutic activity, Neuromuscular re-education, Balance training, Gait training, Patient/Family education, Self Care, Stair training, Prosthetic training, DME instructions, Electrical stimulation, Cryotherapy, Moist heat, and physical performance testing.   PLAN FOR NEXT SESSION:   *** work on standing balance facilitating ankle/residual limb & hip strategies, Prosthetic gait with RW on ramps & curbs. Introduce stairs.    Jamey Reas, PT, DPT 07/22/2022, 8:58 AM

## 2022-07-24 ENCOUNTER — Ambulatory Visit (INDEPENDENT_AMBULATORY_CARE_PROVIDER_SITE_OTHER): Payer: Medicare HMO | Admitting: Internal Medicine

## 2022-07-24 ENCOUNTER — Encounter: Payer: Self-pay | Admitting: Internal Medicine

## 2022-07-24 VITALS — BP 138/82 | HR 108 | Temp 99.5°F | Resp 14 | Ht 75.0 in

## 2022-07-24 DIAGNOSIS — E109 Type 1 diabetes mellitus without complications: Secondary | ICD-10-CM

## 2022-07-24 DIAGNOSIS — E7800 Pure hypercholesterolemia, unspecified: Secondary | ICD-10-CM

## 2022-07-24 DIAGNOSIS — E1165 Type 2 diabetes mellitus with hyperglycemia: Secondary | ICD-10-CM

## 2022-07-24 DIAGNOSIS — G894 Chronic pain syndrome: Secondary | ICD-10-CM

## 2022-07-24 DIAGNOSIS — E1042 Type 1 diabetes mellitus with diabetic polyneuropathy: Secondary | ICD-10-CM | POA: Diagnosis not present

## 2022-07-24 DIAGNOSIS — E1065 Type 1 diabetes mellitus with hyperglycemia: Secondary | ICD-10-CM | POA: Diagnosis not present

## 2022-07-24 DIAGNOSIS — E10649 Type 1 diabetes mellitus with hypoglycemia without coma: Secondary | ICD-10-CM | POA: Diagnosis not present

## 2022-07-24 DIAGNOSIS — E108 Type 1 diabetes mellitus with unspecified complications: Secondary | ICD-10-CM

## 2022-07-24 DIAGNOSIS — E291 Testicular hypofunction: Secondary | ICD-10-CM | POA: Diagnosis not present

## 2022-07-24 MED ORDER — GLUCAGON 1 MG/0.2ML ~~LOC~~ SOAJ: 1.0000 | Freq: Every day | SUBCUTANEOUS | 11 refills | Status: DC | PRN

## 2022-07-24 MED ORDER — TESTOSTERONE CYPIONATE 200 MG/ML IM SOLN: 200.0000 mg | INTRAMUSCULAR | 0 refills | Status: DC

## 2022-07-24 MED ORDER — DEXCOM G6 SENSOR MISC: 11 refills | Status: DC

## 2022-07-24 MED ORDER — DEXCOM G6 TRANSMITTER MISC: 3 refills | Status: DC

## 2022-07-24 MED ORDER — DEXCOM G6 RECEIVER DEVI: 0 refills | Status: DC

## 2022-07-24 MED ORDER — ROSUVASTATIN CALCIUM 20 MG PO TABS: 20.0000 mg | ORAL_TABLET | Freq: Every day | ORAL | 3 refills | Status: DC

## 2022-07-24 MED ORDER — MINIMED 770G INSULIN PUMP SYS KIT: 1.0000 | PACK | Freq: Every day | 1 refills | Status: DC | PRN

## 2022-07-24 NOTE — Patient Instructions (Signed)
It was a pleasure seeing you today! I truly hope you feel like you received 5 star service and please let me know if there is anything I can improve.  Loralee Pacas, MD   Today the plan is...  Hypogonadism in male -     Testosterone Cypionate; Inject 1 mL (200 mg total) into the muscle every 14 (fourteen) days.  Dispense: 10 mL; Refill: 0  Type 2 diabetes mellitus with diabetic polyneuropathy, with long-term current use of insulin ([place]) -     Ambulatory referral to Endocrinology  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[place]) -     Ambulatory referral to Endocrinology -     Glucagon; Inject 1 each into the skin daily as needed.  Dispense: 1 mL; Refill: 11  Type 1 diabetes mellitus with complications ([place]) -     Ambulatory referral to Endocrinology         '[x]'$  RETURN TO CLINIC: No follow-ups on file.   - If you are not doing well: RETURN to the office sooner. - Please bring all your medicines to each appointment.  - If your condition begins to worsen or become severe:  GO to the ER.  '[x]'$  QUESTIONS/CONCERNS:  If you have follow-up questions / concerns:  - CLINICAL: please contact me via phone [phone] OR MyChart messaging  - [place] you will be contacted with the lab results as soon as they are available. For any labs or imaging tests, we will call you if the results are significantly abnormal.  Most normal results will be posted to myChart as soon as they are available and I will comment on them there within 2-3 business days.  The fastest way to get your results is to activate your My Chart account. Instructions are located on the last page of this paperwork. If you have not heard from [place] regarding the results in 2 weeks, please contact this office.  - BILLING: xray and lab orders are billed from separate companies and questions./concerns should be directed to the [facility].  For visit charges please discuss with our administrative services

## 2022-07-24 NOTE — Assessment & Plan Note (Signed)
Main reason I brought him in today was to talk about the A1c of 11.9 on October 9. He reports that his sugars are in the 100-1 50 range over the last month on 6 times a day checking just with the only change being he is taking better care of himself with exercise and diet.  He still takes insulin injections and and has been taking the much more consistently since he saw the A1c data and he also wonders as how to interpret that data.  He lives to eat and take his insulin with a #3 meal but he does understand healthy diet for diabetic does not include the #3 meal.  Him to do the continuous glucose meters but he prefers just doing really frequent sticking with the glucose meter and lancets so he will continue doing that 6 times a day  He has a guy up at [employer] that is going to do his eye exam soon I encouraged him to get that done  Seems like there is occasional 300s sugar I did encourage him to consider starting on a diabetes injectable medication  I applauded him for doing an outstanding job on fixing his sugar and getting the average down but when I looked at his meter I realize he has very brittle diabetes with sugars ranging from 36-2 50s with numerous low sugars and so I recommended that we try to get an endocrinologist to support efforts to get continuous glucose meter and an insulin pump and minimize the risk of serious adverse effect with hypoglycemia and I called in a glucagon injector  I encouraged him to call back [facility] to give more information to get the MiniMed and the G6 tandem pump which I think could really help with his brittle diabetes I also had brittle diabetes to his problem list

## 2022-07-24 NOTE — Assessment & Plan Note (Signed)
Discovered he was out of atorvastatin Will start rosuvastatin.

## 2022-07-24 NOTE — Progress Notes (Signed)
[place] at [employer]:  [phone]   Routine Medical Office Visit  Patient:  [name]      Age: 56 y.o.       Sex:  male  Date:   07/24/2022  PCP:    Loralee Pacas, [place] Provider: Loralee Pacas, MD  Assessment/Plan:   Darrell was seen today for 3 month follow-uo and testosterone management.  Hypogonadism in male Overview: - Continue testosterone gel  Orders: -     Testosterone Cypionate; Inject 1 mL (200 mg total) into the muscle every 14 (fourteen) days.  Dispense: 10 mL; Refill: 0  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[place]) -     Ambulatory referral to Endocrinology -     Glucagon; Inject 1 each into the skin daily as needed.  Dispense: 1 mL; Refill: 11 -     MiniMed 770G Insulin Pump Sys; 1 each by Does not apply route daily as needed.  Dispense: 1 kit; Refill: 1 -     Dexcom G6 Receiver; Please provide 1 receiver  Dispense: 1 each; Refill: 0 -     Dexcom G6 Sensor; New sensor every 10 days  Dispense: 3 each; Refill: 11 -     Dexcom G6 Transmitter; Please provide once every 3 months  Dispense: 1 each; Refill: 3  Type 1 diabetes mellitus with complications ([place]) -     Ambulatory referral to Endocrinology -     MiniMed 770G Insulin Pump Sys; 1 each by Does not apply route daily as needed.  Dispense: 1 kit; Refill: 1 -     Dexcom G6 Receiver; Please provide 1 receiver  Dispense: 1 each; Refill: 0 -     Dexcom G6 Sensor; New sensor every 10 days  Dispense: 3 each; Refill: 11 -     Dexcom G6 Transmitter; Please provide once every 3 months  Dispense: 1 each; Refill: 3  Type 1 diabetes mellitus with diabetic polyneuropathy, with long-term current use of insulin (HCC) Overview: Lab Results  Component Value Date   HGBA1C 11.9 (H) 06/15/2022   HGBA1C 8.6 (A) 12/05/2021   HGBA1C 8.6 12/05/2021   HGBA1C 8.6 (A) 12/05/2021   HGBA1C 8.6 (A) 12/05/2021    Used to be type 2 but he shows brisk response to insulin and no response to oral  hypoglycemics so I changed this diagnosis to type 1 on 07/24/22 after he showed multiple severe hypoglycemias with insulin only.  Assessment & Plan: Main reason I brought him in today was to talk about the A1c of 11.9 on October 9. He reports that his sugars are in the 100-1 50 range over the last month on 6 times a day checking just with the only change being he is taking better care of himself with exercise and diet.  He still takes insulin injections and and has been taking the much more consistently since he saw the A1c data and he also wonders as how to interpret that data.  He lives to eat and take his insulin with a #3 meal but he does understand healthy diet for diabetic does not include the #3 meal.  Him to do the continuous glucose meters but he prefers just doing really frequent sticking with the glucose meter and lancets so he will continue doing that 6 times a day  He has a guy up at [facility] that is going to do his eye exam soon I encouraged him to get that done  Seems like  there is occasional 300s sugar I did encourage him to consider starting on a diabetes injectable medication  I applauded him for doing an outstanding job on fixing his sugar and getting the average down but when I looked at his meter I realize he has very brittle diabetes with sugars ranging from 36-2 50s with numerous low sugars and so I recommended that we try to get an endocrinologist to support efforts to get continuous glucose meter and an insulin pump and minimize the risk of serious adverse effect with hypoglycemia and I called in a glucagon injector  I encouraged him to call back [facility] to give more information to get the MiniMed and the G6 tandem pump which I think could really help with his brittle diabetes I also had brittle diabetes to his problem list  Orders: -     MiniMed 770G Insulin Pump Sys; 1 each by Does not apply route daily as needed.  Dispense: 1 kit; Refill: 1 -     Dexcom G6  Receiver; Please provide 1 receiver  Dispense: 1 each; Refill: 0 -     Dexcom G6 Sensor; New sensor every 10 days  Dispense: 3 each; Refill: 11 -     Dexcom G6 Transmitter; Please provide once every 3 months  Dispense: 1 each; Refill: 3  Chronic pain syndrome Overview: -Continue oxycodone, Dilaudid Following with pain specialist. Multiple MRI showing extensive lumbar disc degeneration - has been considered for surgery Following with physical therapy    Hypercholesterolemia Overview: Lipid Panel     Component Value Date/Time   CHOL 183 12/05/2021 1158   TRIG 127 12/05/2021 1158   HDL 39 (L) 12/05/2021 1158   CHOLHDL 4.7 12/05/2021 1158   LDLCALC 121 (H) 12/05/2021 1158   LABVLDL 23 12/05/2021 1158  Ran out of atorvastatin and it has not been resumed so we resumed as rosuvastatin 07/24/22  Assessment & Plan: Discovered he was out of atorvastatin Will start rosuvastatin.  Orders: -     Rosuvastatin Calcium; Take 1 tablet (20 mg total) by mouth daily.  Dispense: 90 tablet; Refill: 3  Type 2 diabetes mellitus with hyperglycemia, unspecified whether long term insulin use (HCC)  Hypoglycemia due to type 1 diabetes mellitus (HCC) -     MiniMed 770G Insulin Pump Sys; 1 each by Does not apply route daily as needed.  Dispense: 1 kit; Refill: 1 -     Dexcom G6 Receiver; Please provide 1 receiver  Dispense: 1 each; Refill: 0 -     Dexcom G6 Sensor; New sensor every 10 days  Dispense: 3 each; Refill: 11 -     Dexcom G6 Transmitter; Please provide once every 3 months  Dispense: 1 each; Refill: 3    Return in about 5 months (around 12/23/2022) for testosterone replacement f/u .    Subjective:   [name] is a 56 y.o. male with past medical history including: Past Medical History:  Diagnosis Date   Acquired complex renal cyst 09/25/2019   AKI (acute kidney injury) ([place]) 07/22/2021   Cancer ([place])    renal   Chest pain 73/53/2992   Complication of anesthesia    Decubitus  ulcer of sacral area 12/15/2021   Depression    Diabetes mellitus without complication ([place])    Diabetic infection of left foot ([place]) 04/17/2021   Episode of unresponsiveness 12/15/2021   Gas gangrene of foot ([place]) 07/22/2021   Healthcare maintenance 09/25/2019   Hematuria 09/25/2019   History of kidney stones  Lactic acidosis 12/15/2021   Leukocytosis 09/13/2021   Osteomyelitis ([place])    Sacral pressure injury of skin 07/27/2021   Septic shock ([place]) 07/22/2021   Severe sepsis with acute organ dysfunction ([place]) 07/22/2021   Shortness of breath 05/16/2021   Stage 3a chronic kidney disease ([place]) 81/27/5170   Systolic dysfunction    Type 2 diabetes mellitus 10/12/2019     He is presenting today with: Chief Complaint  Patient presents with   3 month follow-uo    Blood sugar readings: 153 last seven days, 121 in the last fourteen days, 124 in the last thirty days.   Testosterone management    Since last visit - doing physical therapy Brought his meter in last visit 154 x 7 days, 14d avg 134, 30d 121. Gets low sugars at night. Already taking testosterone shots.  Stephanie [name] since 1 cc vials  Lab Results  Component Value Date   TESTOSTERONE 556.94 06/23/2022    He has not felt good this week but says his wound looks good not had any clear fever but was 99.5 today.  Does not really have any other signs or symptoms just does not feel well this week so has not gone to the gym much.  Has been following with physical therapy and that is going well denies any signs of infection at the site of his testosterone injection site, requests the 10 mill vials in order to cut because it is much more expensive than the 1 mL vials he was receiving.  Problem focused charting was used to record today's medical interview as follows: Problem  Brittle Diabetes Mellitus (Hcc)  Type 1 Diabetes Mellitus With Complications (Hcc)  Chronic Pain   -Continue oxycodone, Dilaudid Following with pain  specialist. Multiple MRI showing extensive lumbar disc degeneration - has been considered for surgery Following with physical therapy    Hypercholesterolemia   Lipid Panel     Component Value Date/Time   CHOL 183 12/05/2021 1158   TRIG 127 12/05/2021 1158   HDL 39 (L) 12/05/2021 1158   CHOLHDL 4.7 12/05/2021 1158   LDLCALC 121 (H) 12/05/2021 1158   LABVLDL 23 12/05/2021 1158  Ran out of atorvastatin and it has not been resumed so we resumed as rosuvastatin 07/24/22   Type 1 Diabetes Mellitus With Diabetic Polyneuropathy, With Long-Term Current Use of Insulin (Hcc)   Lab Results  Component Value Date   HGBA1C 11.9 (H) 06/15/2022   HGBA1C 8.6 (A) 12/05/2021   HGBA1C 8.6 12/05/2021   HGBA1C 8.6 (A) 12/05/2021   HGBA1C 8.6 (A) 12/05/2021    Used to be type 2 but he shows brisk response to insulin and no response to oral hypoglycemics so I changed this diagnosis to type 1 on 07/24/22 after he showed multiple severe hypoglycemias with insulin only.   Infected Ulcer of Skin (Hcc) (Resolved)  Complicated Uti (Urinary Tract Infection) (Resolved)  Gas Gangrene of Foot (Hcc) (Resolved)  Diabetic Infection of Left Foot (Hcc) (Resolved)           Objective:  Physical Exam: BP 138/82 (BP Location: Left Arm, Patient Position: Sitting)   Pulse (!) 108   Temp 99.5 F (37.5 C) (Temporal)   Resp 14   Ht _0  (1.905 m)   SpO2 96%   BMI 25.25 kg/m   Problem-specific physical exam findings:  Physical Exam Vitals and nursing note reviewed.  Constitutional:      General: He is not in acute distress.    Appearance: Normal appearance.  He is not ill-appearing, toxic-appearing or diaphoretic.  HENT:     Head: Normocephalic and atraumatic.  Eyes:     General: No scleral icterus.    Conjunctiva/sclera: Conjunctivae normal.  Feet:     Comments: Wearing bilateral prosthetic limbs for bka, using walker support Skin:    General: Skin is warm and dry.  Neurological:     General: No  focal deficit present.     Mental Status: He is alert.  Psychiatric:        Mood and Affect: Mood normal.        Behavior: Behavior normal.       Results Reviewed:  No results found for any visits on 07/24/22.   Recent Results (from the past 2160 hour(s))  Glucose, capillary     Status: Abnormal   Collection Time: 06/15/22  2:11 PM  Result Value Ref Range   Glucose-Capillary 450 (H) 70 - 99 mg/dL    Comment: Glucose reference range applies only to samples taken after fasting for at least 8 hours.  No blood products     Status: None   Collection Time: 06/15/22  2:30 PM  Result Value Ref Range   Transfuse no blood products      TRANSFUSE NO BLOOD PRODUCTS, VERIFIED BY Mariann Barter, RN 10.09.23  Performed at [facility], [place] [address], [place], [place] [address]   Basic metabolic panel per protocol     Status: Abnormal   Collection Time: 06/15/22  2:32 PM  Result Value Ref Range   Sodium 132 (L) 135 - 145 mmol/L   Potassium 4.3 3.5 - 5.1 mmol/L   Chloride 100 98 - 111 mmol/L   CO2 24 22 - 32 mmol/L   Glucose, Bld 421 (H) 70 - 99 mg/dL    Comment: Glucose reference range applies only to samples taken after fasting for at least 8 hours.   BUN 26 (H) 6 - 20 mg/dL   Creatinine, Ser 1.68 (H) 0.61 - 1.24 mg/dL   Calcium 8.8 (L) 8.9 - 10.3 mg/dL   GFR, Estimated 47 (L) >60 mL/min    Comment: (NOTE) Calculated using the CKD-EPI Creatinine Equation (2021)    Anion gap 8 5 - 15    Comment: Performed at [facility], [place] [address] [place], [place] [address]  Hemoglobin A1c per protocol     Status: Abnormal   Collection Time: 06/15/22  2:32 PM  Result Value Ref Range   Hgb A1c MFr Bld 11.9 (H) 4.8 - 5.6 %    Comment: (NOTE) Pre diabetes:          5.7%-6.4%  Diabetes:              >6.4%  Glycemic control for   <7.0% adults with diabetes    Mean Plasma Glucose 294.83 mg/dL    Comment: Performed at [facility], [place] [address] [place],  [address]  CBC per protocol     Status: Abnormal   Collection Time: 06/15/22  2:32 PM  Result Value Ref Range   WBC 5.4 4.0 - 10.5 K/uL   RBC 6.05 (H) 4.22 - 5.81 MIL/uL   Hemoglobin 18.6 (H) 13.0 - 17.0 g/dL   HCT 55.1 (H) 39.0 - 52.0 %   MCV 91.1 80.0 - 100.0 fL   MCH 30.7 26.0 - 34.0 pg   MCHC 33.8 30.0 - 36.0 g/dL   RDW 12.5 11.5 - 15.5 %   Platelets 194 150 -  400 K/uL   nRBC 0.0 0.0 - 0.2 %    Comment: Performed at [facility], [place] [address] [place], [place] [address]  Testosterone     Status: None   Collection Time: 06/23/22  2:59 PM  Result Value Ref Range   Testosterone 556.94 300.00 - 890.00 ng/dL  Blood culture (routine single)     Status: None   Collection Time: 07/03/22  1:38 PM   Specimen: Blood  Result Value Ref Range   MICRO NUMBER: [id number]    SPECIMEN QUALITY: Adequate    Source RT AC TK    STATUS: FINAL    Result: No growth after 5 days    COMMENT: Aerobic and anaerobic bottle received.   CBC     Status: Abnormal   Collection Time: 07/03/22  1:38 PM  Result Value Ref Range   WBC 4.6 4.0 - 10.5 K/uL   RBC 5.80 4.22 - 5.81 Mil/uL   Platelets 179.0 150.0 - 400.0 K/uL   Hemoglobin 17.7 (H) 13.0 - 17.0 g/dL   HCT 53.3 (H) 39.0 - 52.0 %   MCV 91.8 78.0 - 100.0 fl   MCHC 33.2 30.0 - 36.0 g/dL   RDW 14.0 11.5 - 15.5 %  Comp Met (CMET)     Status: Abnormal   Collection Time: 07/03/22  1:38 PM  Result Value Ref Range   Sodium 138 135 - 145 mEq/L   Potassium 4.3 3.5 - 5.1 mEq/L   Chloride 102 96 - 112 mEq/L   CO2 28 19 - 32 mEq/L   Glucose, Bld 195 (H) 70 - 99 mg/dL   BUN 30 (H) 6 - 23 mg/dL   Creatinine, Ser 1.12 0.40 - 1.50 mg/dL   Total Bilirubin 0.5 0.2 - 1.2 mg/dL   Alkaline Phosphatase 73 39 - 117 U/L   AST 24 0 - 37 U/L   ALT 29 0 - 53 U/L   Total Protein 6.8 6.0 - 8.3 g/dL   Albumin 4.0 3.5 - 5.2 g/dL   GFR 73.47 >60.00 mL/min    Comment: Calculated using the CKD-EPI Creatinine Equation (2021)   Calcium 9.5  8.4 - 10.5 mg/dL  C-reactive protein     Status: None   Collection Time: 07/03/22  1:38 PM  Result Value Ref Range   CRP <1.0 0.5 - 20.0 mg/dL  Sedimentation rate     Status: Abnormal   Collection Time: 07/03/22  1:38 PM  Result Value Ref Range   Sed Rate 25 (H) 0 - 20 mm/hr

## 2022-07-27 ENCOUNTER — Encounter (HOSPITAL_BASED_OUTPATIENT_CLINIC_OR_DEPARTMENT_OTHER): Payer: Medicare HMO | Admitting: General Surgery

## 2022-07-27 DIAGNOSIS — G8929 Other chronic pain: Secondary | ICD-10-CM | POA: Diagnosis not present

## 2022-07-27 DIAGNOSIS — Z89511 Acquired absence of right leg below knee: Secondary | ICD-10-CM | POA: Diagnosis not present

## 2022-07-27 DIAGNOSIS — I110 Hypertensive heart disease with heart failure: Secondary | ICD-10-CM | POA: Diagnosis not present

## 2022-07-27 DIAGNOSIS — Z89512 Acquired absence of left leg below knee: Secondary | ICD-10-CM | POA: Diagnosis not present

## 2022-07-27 DIAGNOSIS — I509 Heart failure, unspecified: Secondary | ICD-10-CM | POA: Diagnosis not present

## 2022-07-27 DIAGNOSIS — L89154 Pressure ulcer of sacral region, stage 4: Secondary | ICD-10-CM | POA: Diagnosis not present

## 2022-07-27 DIAGNOSIS — E11622 Type 2 diabetes mellitus with other skin ulcer: Secondary | ICD-10-CM | POA: Diagnosis not present

## 2022-07-27 NOTE — Progress Notes (Signed)
ABIEL, ANTRIM ([id number]) [id number] Page 1 of 8 Visit Report for 07/27/2022 Arrival Information Details Patient Name: Date of Service: [place], [name] 07/27/2022 1:15 PM Medical Record Number: [id number] Patient Account Number: [id number] Date of Birth/Sex: Treating RN: [birth year] (56 y.o. [name] Primary Care Celisse Ciulla: [place] Delphina Cahill Other Clinician: Referring Davaughn Hillyard: Treating Frederika Hukill/Extender: [name] [place] RRISO Suzette Battiest in Treatment: 28 Visit Information History Since Last Visit Added or deleted any medications: Yes Patient Arrived: Walker Any new allergies or adverse reactions: No Arrival Time: 13:22 Had a fall or experienced change in No Accompanied By: friend activities of daily living that may affect Transfer Assistance: None risk of falls: Patient Identification Verified: Yes Signs or symptoms of abuse/neglect since last visito No Secondary Verification Process Completed: Yes Hospitalized since last visit: No Patient Requires Transmission-Based Precautions: No Implantable device outside of the clinic excluding No Patient Has Alerts: No cellular tissue based products placed in the center since last visit: Has Dressing in Place as Prescribed: Yes Pain Present Now: Yes Electronic Signature(s) Signed: 07/27/2022 5:36:58 PM By: Baruch Gouty RN, BSN Entered By: Baruch Gouty on 07/27/2022 13:23:38 -------------------------------------------------------------------------------- Encounter Discharge Information Details Patient Name: Date of Service: [name], [name] 07/27/2022 1:15 PM Medical Record Number: [id number] Patient Account Number: [id number] Date of Birth/Sex: Treating RN: 1965/11/07 (56 y.o. [name] Primary Care Yamili Lichtenwalner: [place] Delphina Cahill Other Clinician: Referring Jamall Strohmeier: Treating Randall Rampersad/Extender: [name] [place] RRISO [name] Weeks in Treatment: 28 Encounter Discharge  Information Items Post Procedure Vitals Discharge Condition: Stable Temperature (F): 98.3 Ambulatory Status: Walker Pulse (bpm): 108 Discharge Destination: Home Respiratory Rate (breaths/min): 18 Transportation: Private Auto Blood Pressure (mmHg): 138/83 Accompanied By: friend Schedule Follow-up Appointment: Yes Clinical Summary of Care: Patient Declined Electronic Signature(s) Signed: 07/27/2022 5:36:58 PM By: Baruch Gouty RN, BSN Entered By: Baruch Gouty on 07/27/2022 14:11:13 [name] ([id number]) [id number] Page 2 of 8 -------------------------------------------------------------------------------- Lower Extremity Assessment Details Patient Name: Date of Service: [name] 07/27/2022 1:15 PM Medical Record Number: [id number] Patient Account Number: [id number] Date of Birth/Sex: Treating RN: 04/22/1966 (56 y.o. [name] Primary Care Jaeline Whobrey: [place] Delphina Cahill Other Clinician: Referring Linnaea Ahn: Treating Anzleigh Slaven/Extender: [name] [place] RRISO [name] Weeks in Treatment: 28 Electronic Signature(s) Signed: 07/27/2022 5:36:58 PM By: Baruch Gouty RN, BSN Entered By: Baruch Gouty on 07/27/2022 13:26:13 -------------------------------------------------------------------------------- Multi Wound Chart Details Patient Name: Date of Service: [name], [name] 07/27/2022 1:15 PM Medical Record Number: [id number] Patient Account Number: [id number] Date of Birth/Sex: Treating RN: 1966/03/23 (56 y.o. M) Primary Care Jearlene Bridwell: [place] RRISO [name] Other Clinician: Referring Tanylah Schnoebelen: Treating Artia Singley/Extender: [name] [place] RRISO N, RYA N Weeks in Treatment: 28 Vital Signs Height(in): Capillary Blood Glucose(mg/dl): 132 Weight(lbs): Pulse(bpm): 108 Body Mass Index(BMI): Blood Pressure(mmHg): 138/83 Temperature(F): 98.3 Respiratory Rate(breaths/min): 18 [1:Photos:] [N/A:N/A] Sacrum N/A N/A Wound  Location: Pressure Injury N/A N/A Wounding Event: Pressure Ulcer N/A N/A Primary Etiology: Congestive Heart Failure, N/A N/A Comorbid History: Hypertension, Type II Diabetes, Osteomyelitis, Confinement Anxiety 07/29/2021 N/A N/A Date Acquired: 49 N/A N/A Weeks of Treatment: Open N/A N/A Wound Status: No N/A N/A Wound Recurrence: 1.1x1.4x1.4 N/A N/A Measurements L x W x D (cm) 1.21 N/A N/A A (cm) : rea 1.693 N/A N/A Volume (cm) : 79.50% N/A N/A % Reduction in A rea: 73.90% N/A N/A % Reduction in Volume: 10 Starting Position 1 (o'clock): 8 Ending Position 1 (o'clock): 1.3 Maximum Distance 1 (  cm): Yes N/A N/A Undermining: Category/Stage IV N/A N/A Classification: Medium N/A N/A Exudate A mount: Serosanguineous N/A N/A Exudate TypeEBRIMA, RANTA ([id number]) [id number] Page 3 of 8 red, brown N/A N/A Exudate Color: Epibole N/A N/A Wound Margin: Large (67-100%) N/A N/A Granulation Amount: Red, Pink N/A N/A Granulation Quality: Small (1-33%) N/A N/A Necrotic Amount: Fat Layer (Subcutaneous Tissue): Yes N/A N/A Exposed Structures: Fascia: No Tendon: No Muscle: No Joint: No Bone: No Small (1-33%) N/A N/A Epithelialization: Debridement - Selective/Open Wound N/A N/A Debridement: Pre-procedure Verification/Time Out 13:55 N/A N/A Taken: Lidocaine 4% Topical Solution N/A N/A Pain Control: Skin/Epidermis N/A N/A Level: 1.54 N/A N/A Debridement A (sq cm): rea Curette N/A N/A Instrument: Minimum N/A N/A Bleeding: Pressure N/A N/A Hemostasis A chieved: 0 N/A N/A Procedural Pain: 0 N/A N/A Post Procedural Pain: Procedure was tolerated well N/A N/A Debridement Treatment Response: 1.1x1.4x1.4 N/A N/A Post Debridement Measurements L x W x D (cm) 1.693 N/A N/A Post Debridement Volume: (cm) Category/Stage IV N/A N/A Post Debridement Stage: Callus: Yes N/A N/A Periwound Skin Texture: Rash: Yes Maceration: Yes N/A  N/A Periwound Skin Moisture: Dry/Scaly: No No Abnormalities Noted N/A N/A Periwound Skin Color: No Abnormality N/A N/A Temperature: Debridement N/A N/A Procedures Performed: Treatment Notes Wound #1 (Sacrum) Cleanser Peri-Wound Care Zinc Oxide Ointment 30g tube Discharge Instruction: Apply Zinc Oxide to macerated periwound with each dressing change as needed Lotrisone Discharge Instruction: Apply Lotrisone to buttock rash with dressing changes Topical Primary Dressing Dakin's Solution 0.25%, 16 (oz) Discharge Instruction: Moisten gauze with Dakin's solution and pack into wound Medline Woven Gauze Sponges 4x4 (in/in) Discharge Instruction: pack into wound, moisten with saline and Keystone compound, smaller dressing over wound to hold packing in wouind Secondary Dressing MPM Excel SAP Bordered Dressing, 7x6.7 (Sacral) (in/in) Discharge Instruction: Apply silicone border over primary dressing as directed. Secured With Compression Wrap Compression Stockings Environmental education officer) Signed: 07/27/2022 2:56:09 PM By: [name] MD FACS Entered By: [name] on 07/27/2022 14:56:09 [name] ([id number]) [id number] Page 4 of 8 -------------------------------------------------------------------------------- Multi-Disciplinary Care Plan Details Patient Name: Date of Service: [place], [name] 07/27/2022 1:15 PM Medical Record Number: [id number] Patient Account Number: [id number] Date of Birth/Sex: Treating RN: [birth year] (56 y.o. [name] Primary Care Anett Ranker: [place] Delphina Cahill Other Clinician: Referring Indea Dearman: Treating Innocence Schlotzhauer/Extender: [name] [place] RRISO Suzette Battiest in Treatment: George reviewed with physician Active Inactive Nutrition Nursing Diagnoses: Impaired glucose control: actual or potential Potential for alteratiion in Nutrition/Potential for imbalanced  nutrition Goals: Patient/caregiver will maintain therapeutic glucose control Date Initiated: 01/08/2022 Target Resolution Date: 08/06/2022 Goal Status: Active Interventions: Assess HgA1c results as ordered upon admission and as needed Assess patient nutrition upon admission and as needed per policy Provide education on elevated blood sugars and impact on wound healing Treatment Activities: Dietary management education, guidance and counseling : 01/08/2022 Patient referred to Primary Care Physician for further nutritional evaluation : 01/08/2022 Notes: Pressure Nursing Diagnoses: Knowledge deficit related to causes and risk factors for pressure ulcer development Knowledge deficit related to management of pressures ulcers Potential for impaired tissue integrity related to pressure, friction, moisture, and shear Goals: Patient/caregiver will verbalize understanding of pressure ulcer management Date Initiated: 01/08/2022 Target Resolution Date: 08/06/2022 Goal Status: Active Interventions: Assess: immobility, friction, shearing, incontinence upon admission and as needed Assess offloading mechanisms upon admission and as needed Assess potential for pressure ulcer upon admission and as needed Notes: Wound/Skin Impairment Nursing Diagnoses: Impaired tissue integrity Knowledge  deficit related to ulceration/compromised skin integrity Goals: Patient/caregiver will verbalize understanding of skin care regimen Date Initiated: 01/08/2022 Target Resolution Date: 08/06/2022 Goal Status: Active Ulcer/skin breakdown will have a volume reduction of 30% by week 4 Date Initiated: 01/08/2022 Date Inactivated: 02/18/2022 Target Resolution Date: 02/05/2022 Goal Status: Unmet Unmet Reason: VAC leaking Ulcer/skin breakdown will have a volume reduction of 50% by week 8 Date Initiated: 02/18/2022 Date Inactivated: 03/04/2022 Target Resolution Date: 03/05/2022 TAN, CLOPPER ([id number])  [id number] Page 5 of 8 Goal Status: Unmet Unmet Reason: infection Ulcer/skin breakdown will have a volume reduction of 80% by week 12 Date Initiated: 03/04/2022 Date Inactivated: 04/01/2022 Target Resolution Date: 04/02/2022 Goal Status: Unmet Unmet Reason: too much moisture Interventions: Assess patient/caregiver ability to obtain necessary supplies Assess patient/caregiver ability to perform ulcer/skin care regimen upon admission and as needed Assess ulceration(s) every visit Provide education on ulcer and skin care Treatment Activities: Skin care regimen initiated : 01/08/2022 Topical wound management initiated : 01/08/2022 Notes: Electronic Signature(s) Signed: 07/27/2022 5:36:58 PM By: Baruch Gouty RN, BSN Entered By: Baruch Gouty on 07/27/2022 13:39:11 -------------------------------------------------------------------------------- Pain Assessment Details Patient Name: Date of Service: [name], [name] 07/27/2022 1:15 PM Medical Record Number: [id number] Patient Account Number: [id number] Date of Birth/Sex: Treating RN: 04/07/1966 (56 y.o. [name] Primary Care Fynn Adel: [place] Delphina Cahill Other Clinician: Referring Malon Siddall: Treating Laniqua Torrens/Extender: [name] [place] RRISO [name] Weeks in Treatment: 28 Active Problems Location of Pain Severity and Description of Pain Patient Has Paino Yes Site Locations Pain Location: Pain in Ulcers With Dressing Change: No Duration of the Pain. Constant / Intermittento Intermittent Rate the pain. Current Pain Level: 5 Worst Pain Level: 7 Least Pain Level: 0 Character of Pain Describe the Pain: Aching, Tender Pain Management and Medication Current Pain Management: Medication: Yes Is the Current Pain Management Adequate: Adequate Rest: Yes How does your wound impact your activities of daily livingo Sleep: No Bathing: No Appetite: No Relationship With Others: No Bladder  Continence: No Emotions: No Bowel Continence: No Work: No ARIN, PERAL ([id number]) [id number] Page 6 of 8 Toileting: No Drive: No Dressing: No Hobbies: No Electronic Signature(s) Signed: 07/27/2022 5:36:58 PM By: Baruch Gouty RN, BSN Entered By: Baruch Gouty on 07/27/2022 13:26:06 -------------------------------------------------------------------------------- Patient/Caregiver Education Details Patient Name: Date of Service: [name], [name] 11/20/2023andnbsp1:15 PM Medical Record Number: [id number] Patient Account Number: [id number] Date of Birth/Gender: Treating RN: Dec 04, 1965 (56 y.o. [name] Primary Care Physician: [place] Delphina Cahill Other Clinician: Referring Physician: Treating Physician/Extender: [name] [place] RRISO Suzette Battiest in Treatment: 71 Education Assessment Education Provided To: Patient Education Topics Provided Elevated Blood Sugar/ Impact on Healing: Methods: Explain/Verbal Responses: Reinforcements needed, State content correctly Offloading: Methods: Explain/Verbal Responses: Reinforcements needed, State content correctly Wound/Skin Impairment: Methods: Explain/Verbal Responses: Reinforcements needed, State content correctly Electronic Signature(s) Signed: 07/27/2022 5:36:58 PM By: Baruch Gouty RN, BSN Entered By: Baruch Gouty on 07/27/2022 13:39:37 -------------------------------------------------------------------------------- Wound Assessment Details Patient Name: Date of Service: [name], [name] 07/27/2022 1:15 PM Medical Record Number: [id number] Patient Account Number: [id number] Date of Birth/Sex: Treating RN: 05/15/1966 (56 y.o. [name] Primary Care Jayden Rudge: [place] Delphina Cahill Other Clinician: Referring Baby Gieger: Treating Necia Kamm/Extender: [name] [place] RRISO N, RYA N Weeks in Treatment: 28 Wound Status Wound Number: 1 Primary Pressure Ulcer Etiology: Wound  Location: Sacrum Wound Open Wounding Event: Pressure Injury Status: Date Acquired: 07/29/2021 Comorbid Congestive Heart Failure, Hypertension, Type II Diabetes, Weeks Of Treatment: 28 History:  Osteomyelitis, Confinement Anxiety Clustered Wound: No SASHA, RUETH ([id number]) [id number] Page 7 of 8 Photos Wound Measurements Length: (cm) 1.1 Width: (cm) 1.4 Depth: (cm) 1.4 Area: (cm) 1.21 Volume: (cm) 1.693 % Reduction in Area: 79.5% % Reduction in Volume: 73.9% Epithelialization: Small (1-33%) Tunneling: No Undermining: Yes Starting Position (o'clock): 10 Ending Position (o'clock): 8 Maximum Distance: (cm) 1.3 Wound Description Classification: Category/Stage IV Wound Margin: Epibole Exudate Amount: Medium Exudate Type: Serosanguineous Exudate Color: red, brown Foul Odor After Cleansing: No Slough/Fibrino Yes Wound Bed Granulation Amount: Large (67-100%) Exposed Structure Granulation Quality: Red, Pink Fascia Exposed: No Necrotic Amount: Small (1-33%) Fat Layer (Subcutaneous Tissue) Exposed: Yes Necrotic Quality: Adherent Slough Tendon Exposed: No Muscle Exposed: No Joint Exposed: No Bone Exposed: No Periwound Skin Texture Texture Color No Abnormalities Noted: No No Abnormalities Noted: Yes Callus: Yes Temperature / Pain Rash: Yes Temperature: No Abnormality Moisture No Abnormalities Noted: No Dry / Scaly: No Maceration: Yes Treatment Notes Wound #1 (Sacrum) Cleanser Peri-Wound Care Zinc Oxide Ointment 30g tube Discharge Instruction: Apply Zinc Oxide to macerated periwound with each dressing change as needed Lotrisone Discharge Instruction: Apply Lotrisone to buttock rash with dressing changes Topical Primary Dressing Dakin's Solution 0.25%, 16 (oz) Discharge Instruction: Moisten gauze with Dakin's solution and pack into wound Medline Woven Gauze Sponges 4x4 (in/in) Discharge Instruction: pack into wound, moisten with  saline and Keystone compound, smaller dressing over wound to hold packing in wouind Secondary Dressing MPM Excel SAP Bordered Dressing, 7x6.7 (Sacral) (in/in) Bruinsma, Barak ([id number]) [id number] Page 8 of 8 Discharge Instruction: Apply silicone border over primary dressing as directed. Secured With Compression Wrap Compression Stockings Environmental education officer) Signed: 07/27/2022 5:36:58 PM By: Baruch Gouty RN, BSN Entered By: Baruch Gouty on 07/27/2022 13:36:40 -------------------------------------------------------------------------------- [place] Details Patient Name: Date of Service: [name], [name] 07/27/2022 1:15 PM Medical Record Number: [id number] Patient Account Number: [id number] Date of Birth/Sex: Treating RN: 1966-05-15 (56 y.o. [name] Primary Care Newman Waren: [place] Delphina Cahill Other Clinician: Referring Dawnette Mione: Treating Jonna Dittrich/Extender: [name] [place] RRISO [name] Weeks in Treatment: 28 Vital Signs Time Taken: 13:24 Temperature (F): 98.3 Pulse (bpm): 108 Respiratory Rate (breaths/min): 18 Blood Pressure (mmHg): 138/83 Capillary Blood Glucose (mg/dl): 132 Reference Range: 80 - 120 mg / dl Notes glucose per pt report this am Electronic Signature(s) Signed: 07/27/2022 5:36:58 PM By: Baruch Gouty RN, BSN Entered By: Baruch Gouty on 07/27/2022 13:25:06

## 2022-07-27 NOTE — Progress Notes (Signed)
[name], [name] ([id number]) [id number] Page 1 of 10 Visit Report for 07/27/2022 Chief Complaint Document Details Patient Name: Date of Service: [place], [name] 07/27/2022 1:15 PM Medical Record Number: [id number] Patient Account Number: [id number] Date of Birth/Sex: Treating RN: Mar 22, 1966 (56 y.o. M) Primary Care Provider: MO RRISO [name] Other Clinician: Referring Provider: Treating Provider/Extender: [name] MO RRISO [name] Weeks in Treatment: 28 Information Obtained from: Patient Chief Complaint Patient is at the clinic for treatment of an open pressure ulcer Electronic Signature(s) Signed: 07/27/2022 2:56:22 PM By: [name] MD FACS Entered By: [name] on 07/27/2022 14:56:22 -------------------------------------------------------------------------------- Debridement Details Patient Name: Date of Service: [name], [name] 07/27/2022 1:15 PM Medical Record Number: [id number] Patient Account Number: [id number] Date of Birth/Sex: Treating RN: April 05, 1966 (56 y.o. [name] Primary Care Provider: MO Delphina Cahill Other Clinician: Referring Provider: Treating Provider/Extender: [name] MO RRISO [name] Weeks in Treatment: 28 Debridement Performed for Assessment: Wound #1 Sacrum Performed By: Physician [name], MD Debridement Type: Debridement Level of Consciousness (Pre-procedure): Awake and Alert Pre-procedure Verification/Time Out Yes - 13:55 Taken: Start Time: 13:58 Pain Control: Lidocaine 4% T opical Solution T Area Debrided (L x W): otal 1.1 (cm) x 1.4 (cm) = 1.54 (cm) Tissue and other material debrided: Non-Viable, Skin: Epidermis Level: Skin/Epidermis Debridement Description: Selective/Open Wound Instrument: Curette Bleeding: Minimum Hemostasis Achieved: Pressure Procedural Pain: 0 Post Procedural Pain: 0 Response to Treatment: Procedure was tolerated well Level of  Consciousness (Post- Awake and Alert procedure): Post Debridement Measurements of Total Wound Length: (cm) 1.1 Stage: Category/Stage IV Width: (cm) 1.4 Depth: (cm) 1.4 Volume: (cm) 1.693 Character of Wound/Ulcer Post Debridement: Improved Post Procedure Diagnosis Same as Robbie Lis ([id number]) [id number] Page 2 of 10 Notes scribed by Baruch Gouty, RN for Dr. Celine Ahr Electronic Signature(s) Signed: 07/27/2022 4:40:34 PM By: [name] MD FACS Signed: 07/27/2022 5:36:58 PM By: Baruch Gouty RN, BSN Entered By: Baruch Gouty on 07/27/2022 14:13:21 -------------------------------------------------------------------------------- HPI Details Patient Name: Date of Service: [name], [name] 07/27/2022 1:15 PM Medical Record Number: [id number] Patient Account Number: [id number] Date of Birth/Sex: Treating RN: [birth year] (56 y.o. M) Primary Care Provider: MO Delphina Cahill Other Clinician: Referring Provider: Treating Provider/Extender: [name] MO RRISO [name] Weeks in Treatment: 28 History of Present Illness HPI Description: ADMISSION 01/08/2022 This is a 56 year old male with a past medical history notable for type 2 diabetes mellitus (last A1c was 8.6) congestive heart failure, hypertension, chronic pain, and bilateral below-knee amputations. His most recent amputation was in November 2022. While he was in the hospital, he developed a sacral pressure ulcer. He was subsequently in a skilled nursing facility for some time. He was discharged with home health and had been in a wound VAC, but was then admitted to the hospital last week when the wound appeared to be worsening. Apparently the periwound skin was macerated and the device that he had been using was leaking quite a bit. Evaluation while in the hospital included a consultation with infectious disease who did not think he had any evidence of osteomyelitis, plastic  surgery who felt that he was not an appropriate flap candidate (their note also states that he is not interested in a flap) and wound care who took him out of the wound VAC and initiated wet-to-dry dressing changes. He has a new wound VAC from [facility] on order, anticipated delivery today. The wound itself is fairly small and isolated to the  sacrum. There is muscle exposed. No bone is appreciated but it is palpable beneath the surface. The muscle itself is bit pale and there is heaped up epibole around the wound edges. No significant odor or drainage. 01/14/2022: His wound VAC was not initiated until this past Friday. He has not had any issues with the VAC but today we found that the bridge foam was applied directly to the skin rather than over a layer of adhesive drape. His home health nurse also requested that we consider applying silver collagen to the wound bed in addition to the VAC. There is still a little bit of heaped up senescent skin around the wound periphery. No significant change to the wound dimensions. 01/21/2022: No significant change to the wound dimensions. The senescent skin has not reaccumulated. The periwound skin remains a bit macerated but without any obvious breakdown. The wound surface itself has a shiny appearance with a little bit of slough accumulation; no true robust granulation tissue at this time. 01/28/2022: The wound is about the same size but a little bit shallower. There is a little bit of senescent skin reaccumulation at the cranial aspect. The periwound skin is red but not macerated and without any tissue breakdown. The wound still does not have the most robust surface. There is a bit of slough accumulation. 02/04/2022: The wound is a bit smaller and the undermining has come in somewhat. There continues to be granulation tissue formation within the wound bed. No significant slough accumulation and his periwound skin is in better condition. 02/18/2022: The wound stinks today.  There is no obvious pus but the drainage and wound itself are malodorous. He continues to have heaped up tissue within the wound bed that is rather grayish and not particularly robust-appearing. He is very angry today with his situation. 02/25/2022: Last week, in response to the odor coming from the wound, I took a culture and prescribed topical gentamicin as well as oral cefdinir. Apparently [facility] contacted him about a topical compounded antibiotic, but he did not realize this and he hung up on them. Today, the odor is no longer present. There is some senescent skin around the wound margins as well as continued heaped up granulation tissue in the center of the wound. The undermining continues to contract. 03/04/2022: The wound continues to contract and look better. He still has heaped up hypertrophic granulation tissue near the orifice. No odor was coming from the wound today. He is awaiting [place] delivery. 03/18/2022: 2-week follow-up. [facility] topical antibiotic has been initiated. The chemical cauterization of the hypertrophic granulation tissue was quite successful and the surface is much flatter today. He has heaped up senescent skin around the perimeter. His home health providers have figured out a way to keep the wound VAC from losing suction by bolstering with DuoDERM. Overall there has been significant improvement since our last visit. 04/01/2022: 2-week follow-up. The wound continues to contract. Once again, there is heaped up senescent skin around the perimeter. He has a little bit of skin breakdown in the distribution of the adhesive drape from the wound VAC. No odor or purulent drainage. No concern for infection. 04/15/2022: 2-week follow-up. He has developed a fairly substantial rash from the drape adhesive for his wound VAC. The periwound has a lot of heaped up epiboly at the margins. The tissue in the wound bed is a little bit pale but there is no odor or purulent drainage. 04/22/2022:  Last week we discontinued the VAC. Today, the skin around his  wound is in significantly improved condition. His rash is resolving. He does have some heaped up senescent skin around the wound margin and a layer of slough on the wound surface, but there is no concern for active infection. 04/29/2022: The wound continues to contract. The surface is clean. He continues to build up senescent skin around the wound edges that subsequently get a little bit macerated. No concern for infection. 05/06/2022: The wound is smaller again today in all dimensions. The surface is clean. He has his usual accumulation of macerated senescent skin around the Vol[place], Herbie [place] ([id number]) [id number] Page 3 of 10 wound edges. 05/13/2022: Continued wound contracture. The undermining has decreased quite a bit. Clean surface. Senescent skin heaped up around the wound edges, as per usual. 05/22/2022: The macerated senescent skin has accumulated once again. The wound dimensions are slightly smaller and the surface is clean. He has been having difficulty keeping the packing in the wound due to the large foam border dressing that he prefers. 06/03/2022: The wound is smaller and shallower with less undermining. He has built up macerated senescent skin around the margins, as per usual. He and his friend have figured out a way to make sure that his packing stays in the wound with his [place]. 06/12/2022: The wound is a little bit shallower again today. He still has accumulated senescent periwound skin, but otherwise the wound is clean. He will be undergoing bilateral recurrent inguinal hernia repair and umbilical hernia repair next week. 10/20 sacral pressure ulcer. using [facility] and backing wet to dry. Has developed a rash sur rounding the wound 07/03/2022: Continued contracture of the wound. It is very clean and there is no odor or purulent drainage. Continued buildup of senescent skin around  the margin. 07/13/2022: There is less undermining present today. The orifice continues to contract. He continues to accumulate senescent skin around the perimeter. The rash around the wound has improved. 07/27/2022: The wound is smaller again today and the undermining is closed then. There is senescent skin accumulation, as usual. Electronic Signature(s) Signed: 07/27/2022 2:56:55 PM By: [name] MD FACS Entered By: [name] on 07/27/2022 14:56:55 -------------------------------------------------------------------------------- Physical Exam Details Patient Name: Date of Service: [name], [name] 07/27/2022 1:15 PM Medical Record Number: [id number] Patient Account Number: [id number] Date of Birth/Sex: Treating RN: [birth year] (56 y.o. M) Primary Care Provider: MO RRISO [name] Other Clinician: Referring Provider: Treating Provider/Extender: [name] MO RRISO N, RYA N Weeks in Treatment: 28 Constitutional . Slightly tachycardic, asymptomatic.. . . No acute distress. Respiratory Normal work of breathing on room air. Notes 07/27/2022: The wound is smaller again today and the undermining is closed then. There is senescent skin accumulation, as usual. Electronic Signature(s) Signed: 07/27/2022 2:58:30 PM By: [name] MD FACS Entered By: [name] on 07/27/2022 14:58:29 -------------------------------------------------------------------------------- Physician Orders Details Patient Name: Date of Service: [name], [name] 07/27/2022 1:15 PM Medical Record Number: [id number] Patient Account Number: [id number] Date of Birth/Sex: Treating RN: Mar 19, 1966 (56 y.o. [name] Primary Care Provider: MO Delphina Cahill Other Clinician: Referring Provider: Treating Provider/Extender: [name] MO RRISO [name] Weeks in Treatment: 67 Verbal / Phone Orders: No Diagnosis Coding ZYMIERE, TROSTLE ([id number])  [id number] Page 4 of 10 ICD-10 Coding Code Description L89.154 Pressure ulcer of sacral region, stage 4 Z89.512 Acquired absence of left leg below knee Z89.511 Acquired absence of right leg below knee Follow-up Appointments ppointment in 2 weeks. - Dr. Celine Ahr RM 1 with [facility] Basta Return A  Anesthetic Wound #1 Sacrum (In clinic) Topical Lidocaine 4% applied to wound bed Bathing/ Shower/ Hygiene May shower with protection but do not get wound dressing(s) wet. Off-Loading Gel mattress overlay (Group 1) Turn and reposition every 2 hours Wound Treatment Wound #1 - Sacrum Peri-Wound Care: Zinc Oxide Ointment 30g tube 3 x Per Week/30 Days Discharge Instructions: Apply Zinc Oxide to macerated periwound with each dressing change as needed Peri-Wound Care: Lotrisone 3 x Per Week/30 Days Discharge Instructions: Apply Lotrisone to buttock rash with dressing changes Prim Dressing: Dakin's Solution 0.25%, 16 (oz) 3 x Per Week/30 Days ary Discharge Instructions: Moisten gauze with Dakin's solution and pack into wound Prim Dressing: Medline Woven Gauze Sponges 4x4 (in/in) 3 x Per Week/30 Days ary Discharge Instructions: pack into wound, moisten with saline and [facility] compound, smaller dressing over wound to hold packing in wouind Secondary Dressing: MPM Excel SAP Bordered Dressing, 7x6.7 (Sacral) (in/in) 3 x Per Week/30 Days Discharge Instructions: Apply silicone border over primary dressing as directed. Electronic Signature(s) Signed: 07/27/2022 4:40:34 PM By: [name] MD FACS Entered By: [name] on 07/27/2022 14:58:41 -------------------------------------------------------------------------------- Problem List Details Patient Name: Date of Service: [name], [name] 07/27/2022 1:15 PM Medical Record Number: [id number] Patient Account Number: [id number] Date of Birth/Sex: Treating RN: 16-Aug-1966 (56 y.o. [name] Primary Care Provider: MO  Delphina Cahill Other Clinician: Referring Provider: Treating Provider/Extender: [name] MO RRISO [name] Weeks in Treatment: 28 Active Problems ICD-10 Encounter Code Description Active Date MDM Diagnosis L89.154 Pressure ulcer of sacral region, stage 4 01/08/2022 No Yes Z89.512 Acquired absence of left leg below knee 01/08/2022 No Yes [place], Terreon ([id number]) [id number] Page 5 of 10 Z89.511 Acquired absence of right leg below knee 01/08/2022 No Yes Inactive Problems Resolved Problems Electronic Signature(s) Signed: 07/27/2022 2:56:03 PM By: [name] MD FACS Entered By: [name] on 07/27/2022 14:56:03 -------------------------------------------------------------------------------- Progress Note Details Patient Name: Date of Service: [name], [name] 07/27/2022 1:15 PM Medical Record Number: [id number] Patient Account Number: [id number] Date of Birth/Sex: Treating RN: Oct 30, 1965 (57 y.o. M) Primary Care Provider: MO RRISO [name] Other Clinician: Referring Provider: Treating Provider/Extender: [name] MO RRISO [name] Weeks in Treatment: 28 Subjective Chief Complaint Information obtained from Patient Patient is at the clinic for treatment of an open pressure ulcer History of Present Illness (HPI) ADMISSION 01/08/2022 This is a 56 year old male with a past medical history notable for type 2 diabetes mellitus (last A1c was 8.6) congestive heart failure, hypertension, chronic pain, and bilateral below-knee amputations. His most recent amputation was in November 2022. While he was in the hospital, he developed a sacral pressure ulcer. He was subsequently in a skilled nursing facility for some time. He was discharged with home health and had been in a wound VAC, but was then admitted to the hospital last week when the wound appeared to be worsening. Apparently the periwound skin was macerated and the device that he had  been using was leaking quite a bit. Evaluation while in the hospital included a consultation with infectious disease who did not think he had any evidence of osteomyelitis, plastic surgery who felt that he was not an appropriate flap candidate (their note also states that he is not interested in a flap) and wound care who took him out of the wound VAC and initiated wet-to-dry dressing changes. He has a new wound VAC from [facility] on order, anticipated delivery today. The wound itself is fairly small and isolated  to the sacrum. There is muscle exposed. No bone is appreciated but it is palpable beneath the surface. The muscle itself is bit pale and there is heaped up epibole around the wound edges. No significant odor or drainage. 01/14/2022: His wound VAC was not initiated until this past Friday. He has not had any issues with the VAC but today we found that the bridge foam was applied directly to the skin rather than over a layer of adhesive drape. His home health nurse also requested that we consider applying silver collagen to the wound bed in addition to the VAC. There is still a little bit of heaped up senescent skin around the wound periphery. No significant change to the wound dimensions. 01/21/2022: No significant change to the wound dimensions. The senescent skin has not reaccumulated. The periwound skin remains a bit macerated but without any obvious breakdown. The wound surface itself has a shiny appearance with a little bit of slough accumulation; no true robust granulation tissue at this time. 01/28/2022: The wound is about the same size but a little bit shallower. There is a little bit of senescent skin reaccumulation at the cranial aspect. The periwound skin is red but not macerated and without any tissue breakdown. The wound still does not have the most robust surface. There is a bit of slough accumulation. 02/04/2022: The wound is a bit smaller and the undermining has come in somewhat. There  continues to be granulation tissue formation within the wound bed. No significant slough accumulation and his periwound skin is in better condition. 02/18/2022: The wound stinks today. There is no obvious pus but the drainage and wound itself are malodorous. He continues to have heaped up tissue within the wound bed that is rather grayish and not particularly robust-appearing. He is very angry today with his situation. 02/25/2022: Last week, in response to the odor coming from the wound, I took a culture and prescribed topical gentamicin as well as oral cefdinir. Apparently [facility] contacted him about a topical compounded antibiotic, but he did not realize this and he hung up on them. Today, the odor is no longer present. There is some senescent skin around the wound margins as well as continued heaped up granulation tissue in the center of the wound. The undermining continues to contract. 03/04/2022: The wound continues to contract and look better. He still has heaped up hypertrophic granulation tissue near the orifice. No odor was coming from the wound today. He is awaiting [place] delivery. 03/18/2022: 2-week follow-up. [facility] topical antibiotic has been initiated. The chemical cauterization of the hypertrophic granulation tissue was quite successful and the surface is much flatter today. He has heaped up senescent skin around the perimeter. His home health providers have figured out a way to keep the wound VAC from losing suction by bolstering with DuoDERM. Overall there has been significant improvement since our last visit. 04/01/2022: 2-week follow-up. The wound continues to contract. Once again, there is heaped up senescent skin around the perimeter. He has a little bit of skin breakdown in the distribution of the adhesive drape from the wound VAC. No odor or purulent drainage. No concern for infection. LYN, DEEMER ([id number]) [id number] Page 6 of 10 04/15/2022:  2-week follow-up. He has developed a fairly substantial rash from the drape adhesive for his wound VAC. The periwound has a lot of heaped up epiboly at the margins. The tissue in the wound bed is a little bit pale but there is no odor or purulent drainage. 04/22/2022: Last  week we discontinued the VAC. Today, the skin around his wound is in significantly improved condition. His rash is resolving. He does have some heaped up senescent skin around the wound margin and a layer of slough on the wound surface, but there is no concern for active infection. 04/29/2022: The wound continues to contract. The surface is clean. He continues to build up senescent skin around the wound edges that subsequently get a little bit macerated. No concern for infection. 05/06/2022: The wound is smaller again today in all dimensions. The surface is clean. He has his usual accumulation of macerated senescent skin around the wound edges. 05/13/2022: Continued wound contracture. The undermining has decreased quite a bit. Clean surface. Senescent skin heaped up around the wound edges, as per usual. 05/22/2022: The macerated senescent skin has accumulated once again. The wound dimensions are slightly smaller and the surface is clean. He has been having difficulty keeping the packing in the wound due to the large foam border dressing that he prefers. 06/03/2022: The wound is smaller and shallower with less undermining. He has built up macerated senescent skin around the margins, as per usual. He and his friend have figured out a way to make sure that his packing stays in the wound with his [place]. 06/12/2022: The wound is a little bit shallower again today. He still has accumulated senescent periwound skin, but otherwise the wound is clean. He will be undergoing bilateral recurrent inguinal hernia repair and umbilical hernia repair next week. 10/20 sacral pressure ulcer. using [facility] and backing wet to dry. Has developed a rash sur  rounding the wound 07/03/2022: Continued contracture of the wound. It is very clean and there is no odor or purulent drainage. Continued buildup of senescent skin around the margin. 07/13/2022: There is less undermining present today. The orifice continues to contract. He continues to accumulate senescent skin around the perimeter. The rash around the wound has improved. 07/27/2022: The wound is smaller again today and the undermining is closed then. There is senescent skin accumulation, as usual. Patient History Information obtained from Patient, Caregiver, Chart. Family History Cancer - Father, Heart Disease - Father,Paternal Grandparents, Hypertension - Father, No family history of Diabetes, Hereditary Spherocytosis, Kidney Disease, Lung Disease, Seizures, Stroke, Thyroid Problems, Tuberculosis. Social History Never smoker, Marital Status - Divorced, Alcohol Use - Never, Drug Use - No History, Caffeine Use - Moderate - coffee. Medical History Eyes Denies history of Cataracts, Glaucoma, Optic Neuritis Ear/Nose/Mouth/Throat Denies history of Chronic sinus problems/congestion, Middle ear problems Cardiovascular Patient has history of Congestive Heart Failure, Hypertension Endocrine Patient has history of Type II Diabetes Denies history of Type I Diabetes Genitourinary Denies history of End Stage Renal Disease Integumentary (Skin) Denies history of History of Burn Musculoskeletal Patient has history of Osteomyelitis - S1 and coccyx Oncologic Denies history of Received Chemotherapy, Received Radiation Psychiatric Patient has history of Confinement Anxiety Denies history of Anorexia/bulimia Hospitalization/Surgery History - bil BKA. Medical A Surgical History Notes nd Respiratory pulmonary nodules Genitourinary renal mass, urinary hesitancy Musculoskeletal discitis of thoracic region Objective Constitutional Slightly tachycardic, asymptomatic.Marland Kitchen No acute distress. [name], HILDRETH ([id number]) [id number] Page 7 of 10 Vitals Time Taken: 1:24 PM, Temperature: 98.3 F, Pulse: 108 bpm, Respiratory Rate: 18 breaths/min, Blood Pressure: 138/83 mmHg, Capillary Blood Glucose: 132 mg/dl. General Notes: glucose per pt report this am Respiratory Normal work of breathing on room air. General Notes: 07/27/2022: The wound is smaller again today and the undermining is closed then. There is senescent skin accumulation, as  usual. Integumentary (Hair, Skin) Wound #1 status is Open. Original cause of wound was Pressure Injury. The date acquired was: 07/29/2021. The wound has been in treatment 28 weeks. The wound is located on the Sacrum. The wound measures 1.1cm length x 1.4cm width x 1.4cm depth; 1.21cm^2 area and 1.693cm^3 volume. There is Fat Layer (Subcutaneous Tissue) exposed. There is no tunneling noted, however, there is undermining starting at 10:00 and ending at 8:00 with a maximum distance of 1.3cm. There is a medium amount of serosanguineous drainage noted. The wound margin is epibole. There is large (67-100%) red, pink granulation within the wound bed. There is a small (1-33%) amount of necrotic tissue within the wound bed including Adherent Slough. The periwound skin appearance had no abnormalities noted for color. The periwound skin appearance exhibited: Callus, Rash, Maceration. The periwound skin appearance did not exhibit: Dry/Scaly. Periwound temperature was noted as No Abnormality. 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 There was a Selective/Open Wound Skin/Epidermis Debridement with a total area of 1.54 sq cm performed by [name], MD. With the following instrument(s): Curette to remove Non-Viable  tissue/material. Material removed includes Skin: Epidermis after achieving pain control using Lidocaine 4% Topical Solution. No specimens were taken. A time out was conducted at 13:55, prior to the start of the procedure. A Minimum amount of bleeding was controlled with Pressure. The procedure was tolerated well with a pain level of 0 throughout and a pain level of 0 following the procedure. Post Debridement Measurements: 1.1cm length x 1.4cm width x 1.4cm depth; 1.693cm^3 volume. Post debridement Stage noted as Category/Stage IV. Character of Wound/Ulcer Post Debridement is improved. Post procedure Diagnosis Wound #1: Same as Pre-Procedure General Notes: scribed by Baruch Gouty, RN for Dr. Celine Ahr. Plan Follow-up Appointments: Return Appointment in 2 weeks. - Dr. Celine Ahr RM 1 with Vaughan Basta Anesthetic: Wound #1 Sacrum: (In clinic) Topical Lidocaine 4% applied to wound bed Bathing/ Shower/ Hygiene: May shower with protection but do not get wound dressing(s) wet. Off-Loading: Gel mattress overlay (Group 1) Turn and reposition every 2 hours WOUND #1: - Sacrum Wound Laterality: Peri-Wound Care: Zinc Oxide Ointment 30g tube 3 x Per Week/30 Days Discharge Instructions: Apply Zinc Oxide to macerated periwound with each dressing change as needed Peri-Wound Care: Lotrisone 3 x Per Week/30 Days Discharge Instructions: Apply Lotrisone to buttock rash with dressing changes Prim Dressing: Dakin's Solution 0.25%, 16 (oz) 3 x Per Week/30 Days ary Discharge Instructions: Moisten gauze with Dakin's solution and pack into wound Prim Dressing: Medline Woven Gauze Sponges 4x4 (in/in) 3 x Per Week/30 Days ary Discharge Instructions: pack into wound, moisten with saline and Keystone compound, smaller dressing over wound to hold packing in wouind Secondary Dressing: MPM Excel SAP Bordered Dressing, 7x6.7 (Sacral) (in/in) 3 x Per Week/30 Days Discharge Instructions: Apply silicone border over primary dressing as  directed. 07/27/2022: The wound is smaller again today and the undermining is closed then. There is senescent skin accumulation, as usual. I used a curette to debride the senescent skin from the wound. We will continue to pack the site with Dakin's moistened gauze. Follow-up in 2 weeks. DOMINIE, BENEDICK ([id number]) [id number] Page 8 of 10 Electronic Signature(s) Signed: 07/27/2022 2:59:12 PM By: [name] MD FACS Entered By: [name] on 07/27/2022 14:59:11 -------------------------------------------------------------------------------- HxROS Details Patient Name:  Date of Service: [name] 07/27/2022 1:15 PM Medical Record Number: [id number] Patient Account Number: [id number] Date of Birth/Sex: Treating RN: 05/01/1966 (56 y.o. M) Primary Care Provider: [place] [facility] [name] Other Clinician: Referring Provider: Treating Provider/Extender: [name] [place] [facility] [name] Weeks in Treatment: 28 Information Obtained From Patient Caregiver Chart Eyes Medical History: Negative for: Cataracts; Glaucoma; Optic Neuritis Ear/Nose/Mouth/Throat Medical History: Negative for: Chronic sinus problems/congestion; Middle ear problems Respiratory Medical History: Past Medical History Notes: pulmonary nodules Cardiovascular Medical History: Positive for: Congestive Heart Failure; Hypertension Endocrine Medical History: Positive for: Type II Diabetes Negative for: Type I Diabetes Time with diabetes: since 2004 Treated with: Insulin Blood sugar tested every day: Yes Tested : 4 times per day Genitourinary Medical History: Negative for: End Stage Renal Disease Past Medical History Notes: renal mass, urinary hesitancy Integumentary (Skin) Medical History: Negative for: History of Burn Musculoskeletal Medical History: Positive for: Osteomyelitis - S1 and coccyx Past Medical History Notes: discitis of thoracic region Oncologic Medical  History: Negative for: Received Chemotherapy; Received Radiation MARKS, SCALERA ([id number]) [id number] Page 9 of 10 Psychiatric Medical History: Positive for: Confinement Anxiety Negative for: Anorexia/bulimia Immunizations Pneumococcal Vaccine: Received Pneumococcal Vaccination: No Implantable Devices No devices added Hospitalization / Surgery History Type of Hospitalization/Surgery bil BKA Family and Social History Cancer: Yes - Father; Diabetes: No; Heart Disease: Yes - Father,Paternal Grandparents; Hereditary Spherocytosis: No; Hypertension: Yes - Father; Kidney Disease: No; Lung Disease: No; Seizures: No; Stroke: No; Thyroid Problems: No; Tuberculosis: No; Never smoker; Marital Status - Divorced; Alcohol Use: Never; Drug Use: No History; Caffeine Use: Moderate - coffee; Financial Concerns: No; Food, Clothing or Shelter Needs: No; Support System Lacking: No; Transportation Concerns: Yes - hurts to transfer Electronic Signature(s) Signed: 07/27/2022 4:40:34 PM By: [name] MD FACS Entered By: [name] on 07/27/2022 14:58:05 -------------------------------------------------------------------------------- SuperBill Details Patient Name: Date of Service: [name], [name] 07/27/2022 Medical Record Number: [id number] Patient Account Number: [id number] Date of Birth/Sex: Treating RN: Jan 29, 1966 (56 y.o. M) Primary Care Provider: [place] [facility] [name] Other Clinician: Referring Provider: Treating Provider/Extender: [name] [place] [facility] [name] Weeks in Treatment: 28 Diagnosis Coding ICD-10 Codes Code Description L89.154 Pressure ulcer of sacral region, stage 4 Z89.512 Acquired absence of left leg below knee Z89.511 Acquired absence of right leg below knee Facility Procedures : CPT4 Code: [id number] Description: 35701 - DEBRIDE WOUND 1ST 20 SQ CM OR < ICD-10 Diagnosis Description L89.154 Pressure ulcer of sacral region, stage  4 Modifier: Quantity: 1 Physician Procedures : CPT4 Code Description Modifier [id number] - WC PHYS LEVEL 4 - EST PT 25 ICD-10 Diagnosis Description L89.154 Pressure ulcer of sacral region, stage 4 Z89.512 Acquired absence of left leg below knee Z89.511 Acquired absence of right leg below knee Quantity: 1 : [id number] 07622 - WC PHYS DEBR WO ANESTH 20 SQ CM ICD-10 Diagnosis Description RAIF, CHACHERE ([id number]) [id number] L89.154 Pressure ulcer of sacral region, stage 4 Quantity: 1 f Page 10 of 10 Electronic Signature(s) Signed: 07/27/2022 2:59:29 PM By: [name] MD FACS Entered By: [name] on 07/27/2022 14:59:28

## 2022-07-28 ENCOUNTER — Encounter: Payer: Self-pay | Admitting: Physical Therapy

## 2022-07-28 ENCOUNTER — Ambulatory Visit (INDEPENDENT_AMBULATORY_CARE_PROVIDER_SITE_OTHER): Payer: Medicare HMO | Admitting: Physical Therapy

## 2022-07-28 DIAGNOSIS — R2681 Unsteadiness on feet: Secondary | ICD-10-CM

## 2022-07-28 DIAGNOSIS — R2689 Other abnormalities of gait and mobility: Secondary | ICD-10-CM

## 2022-07-28 DIAGNOSIS — M5459 Other low back pain: Secondary | ICD-10-CM | POA: Diagnosis not present

## 2022-07-28 DIAGNOSIS — M6281 Muscle weakness (generalized): Secondary | ICD-10-CM | POA: Diagnosis not present

## 2022-07-28 DIAGNOSIS — Z7409 Other reduced mobility: Secondary | ICD-10-CM | POA: Diagnosis not present

## 2022-07-28 NOTE — Therapy (Signed)
OUTPATIENT PHYSICAL THERAPY TREATMENT NOTE   Patient Name: [name] MRN: [id number] DOB:04-05-1966, 56 y.o., male Today's Date: 07/28/2022  PCP: Loralee Pacas, MD REFERRING PROVIDER: Loralee Pacas, MD  END OF SESSION:   PT End of Session - 07/28/22 1345     Visit Number 6    Number of Visits 26    Date for PT Re-Evaluation 10/01/22    Authorization Type [employer]    Authorization Time Period DED MET AND OOP MET Approved  Authorization #[id number]  Tracking #[id number] pt visits 10/31-1/25/24    Authorization - Visit Number 6    Authorization - Number of Visits 12    Progress Note Due on Visit 10    PT Start Time 1157    PT Stop Time 1430    PT Time Calculation (min) 45 min    Equipment Utilized During Treatment Gait belt    Activity Tolerance Patient tolerated treatment well;Patient limited by pain    Behavior During Therapy [facility] for tasks assessed/performed               Past Medical History:  Diagnosis Date   Acquired complex renal cyst 09/25/2019   AKI (acute kidney injury) ([place]) 07/22/2021   Cancer ([place])    renal   Chest pain 26/20/3559   Complication of anesthesia    Decubitus ulcer of sacral area 12/15/2021   Depression    Diabetes mellitus without complication ([place])    Diabetic infection of left foot ([place]) 04/17/2021   Episode of unresponsiveness 12/15/2021   Gas gangrene of foot ([place]) 07/22/2021   Healthcare maintenance 09/25/2019   Hematuria 09/25/2019   History of kidney stones    Lactic acidosis 12/15/2021   Leukocytosis 09/13/2021   Osteomyelitis ([place])    Sacral pressure injury of skin 07/27/2021   Septic shock ([place]) 07/22/2021   Severe sepsis with acute organ dysfunction ([place]) 07/22/2021   Shortness of breath 05/16/2021   Stage 3a chronic kidney disease ([place]) 74/16/3845   Systolic dysfunction    Type 2 diabetes mellitus 10/12/2019   Past Surgical History:  Procedure Laterality Date   AMPUTATION Left 07/23/2021   Procedure: LEFT  BELOW KNEE AMPUTATION;  Surgeon: [name], MD;  Location: [place];  Service: Orthopedics;  Laterality: Left;   BELOW KNEE LEG AMPUTATION Right    BUBBLE STUDY  07/29/2021   Procedure: BUBBLE STUDY;  Surgeon: Sueanne Margarita, MD;  Location: [place];  Service: Cardiovascular;;   CARDIOVERSION N/A 07/29/2021   Procedure: CARDIOVERSION;  Surgeon: Sueanne Margarita, MD;  Location: [facility] ENDOSCOPY;  Service: Cardiovascular;  Laterality: N/A;   RADIOLOGY WITH ANESTHESIA N/A 08/05/2021   Procedure: MRI LUMBAR WITH AND WITHOUT; THORASIC SPINE WITH AND WITHOUT WITH ANESTHESIA;  Surgeon: Radiologist, Medication, MD;  Location: [place];  Service: Radiology;  Laterality: N/A;   RADIOLOGY WITH ANESTHESIA N/A 08/07/2021   Procedure: MRI WITH LUMBER WITH AND WITHOUT CONTRAST,THORACIC WITH AND WITHOUT CONTRAST;  Surgeon: Radiologist, Medication, MD;  Location: [place];  Service: Radiology;  Laterality: N/A;   RADIOLOGY WITH ANESTHESIA N/A 09/13/2021   Procedure: MRI WITH ANESTHESIA;  Surgeon: Luanne Bras, MD;  Location: [place];  Service: Radiology;  Laterality: N/A;   TEE WITHOUT CARDIOVERSION N/A 07/29/2021   Procedure: TRANSESOPHAGEAL ECHOCARDIOGRAM (TEE);  Surgeon: Sueanne Margarita, MD;  Location: [facility] ENDOSCOPY;  Service: Cardiovascular;  Laterality: N/A;   Patient Active Problem List   Diagnosis Date Noted   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[place]) 07/24/2022   Type 1  diabetes mellitus with complications ([place]) 73/41/9379   Malaise 06/22/2022   Polycythemia 06/22/2022   Amputation below knee ([place]) 04/23/2022   PTSD (post-traumatic stress disorder) 04/23/2022   Generalized anxiety disorder 01/21/2022   Episode of unresponsiveness 12/15/2021   Acute respiratory failure with hypoxia (HCC) secondary to suspected aspiration pneumonia 12/15/2021   Renal mass 12/15/2021   Stage IV pressure ulcer of sacral region ([place]) 09/15/2021   Insomnia 02/40/9735   Chronic systolic CHF (congestive heart failure) ([place]) 09/14/2021    Hypogonadism in male 09/14/2021   Chronic pain 09/14/2021   Pseudohyponatremia 09/13/2021   Normocytic anemia 09/13/2021   Discitis of thoracic region    Paroxysmal atrial flutter ([place]) 07/22/2021   Chronic diastolic (congestive) heart failure ([place]) 05/16/2021   Lumbar spondylosis 03/25/2021   H/O amputation of leg through tibia and fibula ([place]) 11/10/2019   Degeneration of lumbar intervertebral disc 11/10/2019   Hypercholesterolemia 11/10/2019   Left below-knee amputee ([place]) 10/30/2019   Dyslipidemia 10/30/2019   Type 1 diabetes mellitus with diabetic polyneuropathy, with long-term current use of insulin ([place]) 10/12/2019   Right below-elbow amputee 10/12/2019   Hypertensive disorder 09/25/2019   Urinary hesitancy 09/25/2019   Prosthesis adjustments 09/25/2019   Erectile dysfunction 09/25/2019   Palliative care status 09/25/2019   Pulmonary nodules 09/25/2019   Depressive disorder 09/25/2019    REFERRING DIAG: G89.4 (ICD-10-CM) - Chronic pain syndrome   Z89.512 Left Below Knee Amputation, Z89.511 history of right Below Knee Amputation.  THERAPY DIAG:  Unsteadiness on feet  Other abnormalities of gait and mobility  Muscle weakness (generalized)  Other low back pain  Impaired functional mobility and activity tolerance  Rationale for Evaluation and Treatment Rehabilitation  PERTINENT HISTORY:  DM2, CHF, HTN, chronic pain, bilateral TTAs  PRECAUTIONS: Fall  SUBJECTIVE:                                                                                                                                                                                      SUBJECTIVE STATEMENT:  He is doing more walking including long walk is up to 263' in 3 min 20 sec.  PAIN:  Are you having pain? Are you having pain? Yes: NPRS scale: today 7/10 and in last week ranging 6-10/10 Pain location: middle low back Pain description: sharp Aggravating factors: extension Relieving factors: sitting  forward,   OBJECTIVE: (objective measures completed at initial evaluation unless otherwise dated)   OBJECTIVE:  COGNITION: Overall cognitive status: Within functional limits for tasks assessed   POSTURE: rounded shoulders, forward head, flexed trunk , and weight shift left   LOWER EXTREMITY ROM:   ROM P:passive  A:active Right eval Left eval  Hip flexion      Hip extension Standing A: -15* Standing A: -15*  Hip abduction      Hip adduction      Hip internal rotation      Hip external rotation      Knee flexion      Knee extension      Ankle dorsiflexion      Ankle plantarflexion      Ankle inversion      Ankle eversion       (Blank rows = not tested)   LOWER EXTREMITY MMT:   MMT Right eval Left eval  Hip flexion 4/5 4/5  Hip extension 3-/5 3-/5  Hip abduction 3/5 3/5  Hip adduction      Hip internal rotation      Hip external rotation      Knee flexion 4/5 4/5  Knee extension 4/5 4/5  Ankle dorsiflexion NA NA  Ankle plantarflexion NA NA  Ankle inversion NA NA  Ankle eversion NA NA  (Blank rows = not tested)   TRANSFERS: Sit to stand: SBA 22" w/c requiring armrests to RW for support for stabilization with poor technique.  Stand to sit: SBA requires RW support to 22" w/c requiring armrests with poor technique.    GAIT: Gait pattern: step to pattern, decreased step length- Right, decreased stance time- Left, Left hip hike, antalgic, trunk flexed, and wide BOS Distance walked: 42' with 90* turn right & left and 180* turn Assistive device utilized: Environmental consultant - 2 wheeled and TTA prosthesis Level of assistance: Min A esp with turns Comments: excessive BUE weight bearing on RW   FUNCTIONAL TESTs:  Berg Balance Scale: 10/56   [facility] PT Assessment - 07/06/22 1300                Standardized Balance Assessment    Standardized Balance Assessment Berg Balance Test          Berg Balance Test    Sit to Stand Needs minimal aid to stand or to stabilize      Standing Unsupported Needs several tries to stand 30 seconds unsupported     Sitting with Back Unsupported but Feet Supported on Floor or Stool Able to sit safely and securely 2 minutes     Stand to Sit Sits independently, has uncontrolled descent     Transfers Able to transfer safely, definite need of hands     Standing Unsupported with Eyes Closed Needs help to keep from falling     Standing Unsupported with Feet Together Needs help to attain position and unable to hold for 15 seconds     From Standing, Reach Forward with Outstretched Arm Loses balance while trying/requires external support     From Standing Position, Pick up Object from Floor Unable to try/needs assist to keep balance     From Standing Position, Turn to Look Behind Over each Shoulder Needs assist to keep from losing balance and falling     Turn 360 Degrees Needs assistance while turning     Standing Unsupported, Alternately Place Feet on Step/Stool Needs assistance to keep from falling or unable to try     Standing Unsupported, One Foot in [employer] balance while stepping or standing     Standing on One Leg Unable to try or needs assist to prevent fall     Total Score 10     Berg comment: BERG  < 36 high risk for falls (close to 100%) 46-51 moderate (>50%)  37-45 significant (>80%) 52-55 lower (> 25%)                   CURRENT PROSTHETIC WEAR ASSESSMENT: Patient is dependent with: skin check, residual limb care, prosthetic cleaning, ply sock cleaning, correct ply sock adjustment, proper wear schedule/adjustment, and proper weight-bearing schedule/adjustment Donning prosthesis: SBA / verbal cues Doffing prosthesis: SBA / verbal cues Prosthetic wear tolerance: liner & flexible suction socket for most of awake hours and wears prostheses maybe ~40% of awake hours,  7 days/week Prosthetic weight bearing tolerance: 5 minutes Edema: none noted Residual limb condition: bilateral redness over patella, dry skin, normal color  & temperature.  Prosthetic description: suction pin suspension with 13m silicon liner as interface & flexible inner socket, total contact socket, dynamic response Flex feet       TODAY'S TREATMENT:                                                                                                                             DATE:  07/28/2022 Prosthetic Training with bilateral Transtibial Prostheses: He reports wearing prostheses all awake hours without issues.  PT recommended having prosthetist trim inner liners so suction sleeve is in contact with skin. PT instructed in motion of prosthesis for driving. Pt verbalized understanding including practice in large empty parking lot.  Pt neg ramp & curb with RW with supervision. PT demo to pt & friend how to guard for safety. Pt verbalized understanding.   Therapeutic Exercise: Recumbent bike seated on his air cushion 3 min level 1. PT demo & verbal cues on set-up & technique including using sets to build overall tolerance. Pt verbalized understanding.  Leg Press BLEs 75# 15 reps 2 sets. Pt verbalized understanding.   07/20/2022 Prosthetic Training with bilateral Transtibial Prostheses: Physical therapist reviewed proper donning with standing so residual limb is fully seated into the socket. patient use posture with posterior pelvis against a wall in order to maintain balance with this activity. patient verbalized understanding of why he needed to donne this way. patient has a small red area on his right medial hamstring tendon. physical therapist recommended using mineral oil prior to donning prosthesis and patient verbalized understanding. PT also made patient aware of the fact that the wrinkles in the suction suspension sleeve could cause the abrasion or irritation.  PT reviewed walking program with short medium and long walks.  Patient verbalized understanding and better knowledge of long walks. PT reviewed standing exercises with put back to door  frame and back to countertop to work on upright posture.  Patient verbalized understanding PT reviewed with demo and verbal cues on proper negotiation of ramps and curbs.  Patient able to negotiate both ramps and cards with a rolling walker with supervision to contact-guard assistance. -Patient able to go sit to and from stand from chairs without armrest using upper extremities to the rolling walker with verbal cues.  07/15/2022 Prosthetic Training with bilateral Transtibial Prostheses: Sit  to/from stand from w/c to RW with supervision. Progressed to 18" chair without armrests to RW - required min guard. Pt amb 50' & 46' with RW supervision with supervision using band for visual cues for step width & step thru.  PT instructed in upright stretch standing back to door frame reaching single & BUEs overhead 2 reps ea 2 deep breath hold. PT demo & verbal cues on neg curb & ramp. Pt neg curb once leading RLE & LLE with RW with minA. Pt neg ramp with RW with modA. PT instructed in walking program of short (room to room) with goal to increase frequency during his day, long (max tolerable distance friend following with w/c) 1-2x/day and medium (distance bw short & long) 4-6x/day. Pt verbalized understanding.  Pt ambulated 175' with RW clinic to his car with supervision.      07/08/22 1604  PT Education  Education Details HEP Access Code: [email or web address] and sink program  Person(s) Educated Patient;Other (comment)  Methods Explanation;Demonstration;Tactile cues;Verbal cues;Handout  Comprehension Verbalized understanding;Returned demonstration;Verbal cues required;Tactile cues required;Need further instruction     HOME EXERCISE PROGRAM: Access Code: [email or web address] URL: [email or web address] Date: 07/08/2022 Prepared by: Jamey Reas  Exercises - Sit to Stand with Counter Support  - 2-3 x daily - 7 x weekly - 1 sets - 10 reps - 5 seconds hold - Seated Hamstring Stretch with Strap  - 2-3 x daily  - 7 x weekly - 1 sets - 2-3 reps - 20-30 seconds hold   Do each exercise 1-2  times per day Do each exercise 5-10 repetitions Hold each exercise for 2 seconds to feel your location  AT [place].  Try to find this position when standing still for activities.   USE TAPE ON FLOOR TO MARK THE MIDLINE POSITION which is even with middle of sink.  You also should try to feel with your limb pressure in socket.  You are trying to feel with limb what you used to feel with the bottom of your foot.  Side to Side Shift: Moving your hips only (not shoulders): move weight onto your left leg, HOLD/FEEL pressure in socket.  Move back to equal weight on each leg, HOLD/FEEL pressure in socket. Move weight onto your right leg, HOLD/FEEL pressure in socket. Move back to equal weight on each leg, HOLD/FEEL pressure in socket. Repeat.  Start with both hands on sink, progress to right hand only hold, then left hand only hold  Front to Back Shift: Moving your hips only (not shoulders): move your weight forward onto your toes, HOLD/FEEL pressure in socket. Move your weight back to equal Flat Foot on both legs, HOLD/FEEL  pressure in socket. Move your weight back onto your heels, HOLD/FEEL  pressure in socket. Move your weight back to equal on both legs, HOLD/FEEL  pressure in socket. Repeat.  Start with both hands on sink, progress to right hand only hold, then left hand only hold  Moving Cones / Cups: With equal weight on each leg: Hold on with one hand the first time, then progress to no hand supports. Move cups from one side of sink to the other. Place cups ~2" out of your reach, progress to 10" beyond reach.  Place one hand in middle of sink and reach with other hand. Do both arms.   Overhead/Upward Reaching: alternated reaching up to top cabinets or ceiling if no cabinets present. Keep equal weight on each leg.  Start with one hand support on counter  while other hand reaches and progress to no hand support with reaching.  ace one hand in middle of sink and reach with other hand. Do both arms.   5.   Looking Over Shoulders: With equal weight on each leg: alternate turning to look over your shoulders with one hand support on counter as needed.  Start with head motions only to look in front of shoulder, then even with shoulder and progress to looking behind you. To look to side, move head /eyes, then shoulder on side looking pulls back, shift more weight to side looking and pull hip back. Place one hand in middle of sink and let go with other hand so your shoulder can pull back. Switch hands to look other way.   6.  Alternating Stepping or tapping toes into lower cabinet:  Move items under cabinet out of your way. Shift your hips/pelvis so weight on right leg & Tighten muscles in hip on right side.  SLOWLY step left leg so front of foot is in cabinet. Then step back to floor with right hip tight.  Repeat with other leg.    PATIENT EDUCATION: PATIENT EDUCATED ON FOLLOWING PROSTHETIC CARE: Education details:   Skin check, Prosthetic cleaning, and Propper donning , positioning in sitting and proper sit to/from stand technique. Person educated: Patient and friend Education method: Explanation and Verbal cues Education comprehension: verbalized understanding, verbal cues required, and needs further education   HOME EXERCISE PROGRAM:   ASSESSMENT:   CLINICAL IMPRESSION: Patient appears to be improving his mobility with bilateral TTA prostheses. His low back pain & abdominal hernias are limiting his standing tolerance.  Pt continues to benefit from skilled PT.    OBJECTIVE IMPAIRMENTS: Abnormal gait, decreased activity tolerance, decreased balance, decreased knowledge of use of DME, decreased mobility, decreased ROM, decreased strength, impaired flexibility, postural dysfunction, prosthetic dependency , and pain.    ACTIVITY LIMITATIONS: carrying,  lifting, bending, sitting, standing, squatting, stairs, transfers, and locomotion level   PARTICIPATION LIMITATIONS: meal prep, cleaning, driving, and community activity   PERSONAL FACTORS: Fitness, Time since onset of injury/illness/exacerbation, and 3+ comorbidities: see PMH  are also affecting patient's functional outcome.    REHAB POTENTIAL: Good   CLINICAL DECISION MAKING: Evolving/moderate complexity   EVALUATION COMPLEXITY: Moderate     GOALS: Goals reviewed with patient? Yes   SHORT TERM GOALS: Target date: 08/06/2022   Patient donnes prosthesis modified independent & verbalizes proper cleaning. Baseline: SEE OBJECTIVE DATA Goal status: INITIAL 2.  Patient verbalizes understanding of initial HEP.  Baseline: SEE OBJECTIVE DATA Goal status: INITIAL   3.  Patient able to stand 60 seconds without UE support and reach 5" anteriorly with supervision.  Baseline: SEE OBJECTIVE DATA Goal status: INITIAL   4. Patient ambulates 24' with RW & prosthesis with supervision. Baseline: SEE OBJECTIVE DATA Goal status: INITIAL   5. Patient negotiates ramps & curbs with RW & prosthesis with minA. Baseline: SEE OBJECTIVE DATA Goal status: INITIAL   LONG TERM GOALS: Target date: 10/01/2022   Patient demonstrates & verbalized understanding of prosthetic care to enable safe utilization of prosthesis. Baseline: SEE OBJECTIVE DATA Goal status: INITIAL   Patient tolerates prostheses wear >90% of awake hours without skin or limb pain issues. Baseline: SEE OBJECTIVE DATA Goal status: INITIAL   Berg Balance >/= 45/56 to indicate lower fall risk Baseline: SEE OBJECTIVE DATA Goal status: INITIAL   Patient ambulates >300' with prostheses and cane or less independently  Baseline: SEE OBJECTIVE DATA Goal status: INITIAL   Patient negotiates ramps, curbs & stairs with single rail with prostheses and cane or less independently. Baseline: SEE OBJECTIVE DATA Goal status: INITIAL   6.    Patient verbalizes & demonstrates understanding of how to properly use fitness equipment to return to gym. Baseline: SEE OBJECTIVE DATA Goal status: INITIAL   PLAN:   PT FREQUENCY: 2x/week   PT DURATION: 90 days / 13 weeks   PLANNED INTERVENTIONS: Therapeutic exercises, Therapeutic activity, Neuromuscular re-education, Balance training, Gait training, Patient/Family education, Self Care, Stair training, Prosthetic training, DME instructions, Electrical stimulation, Cryotherapy, Moist heat, and physical performance testing.   PLAN FOR NEXT SESSION:   continue to work towards STGs,  work on standing balance facilitating ankle/residual limb & hip strategies, Prosthetic gait with RW on ramps & curbs. Introduce stairs.    Jamey Reas, PT, DPT 07/28/2022, 4:33 PM

## 2022-07-29 ENCOUNTER — Ambulatory Visit (INDEPENDENT_AMBULATORY_CARE_PROVIDER_SITE_OTHER): Payer: Medicare HMO | Admitting: Physical Therapy

## 2022-07-29 ENCOUNTER — Encounter: Payer: Self-pay | Admitting: Physical Therapy

## 2022-07-29 DIAGNOSIS — R2689 Other abnormalities of gait and mobility: Secondary | ICD-10-CM | POA: Diagnosis not present

## 2022-07-29 DIAGNOSIS — R2681 Unsteadiness on feet: Secondary | ICD-10-CM

## 2022-07-29 DIAGNOSIS — M6281 Muscle weakness (generalized): Secondary | ICD-10-CM | POA: Diagnosis not present

## 2022-07-29 DIAGNOSIS — M5459 Other low back pain: Secondary | ICD-10-CM | POA: Diagnosis not present

## 2022-07-29 NOTE — Therapy (Signed)
OUTPATIENT PHYSICAL THERAPY TREATMENT NOTE   Patient Name: [name] MRN: [id number] DOB:May 08, 1966, 55 y.o., male Today's Date: 07/29/2022  PCP: Loralee Pacas, MD REFERRING PROVIDER: Loralee Pacas, MD  END OF SESSION:   PT End of Session - 07/29/22 1349     Visit Number 7    Number of Visits 26    Date for PT Re-Evaluation 10/01/22    Authorization Type [employer]    Authorization Time Period DED MET AND OOP MET Approved  Authorization #[id number]  Tracking #[id number] pt visits 10/31-1/25/24    Authorization - Visit Number 7    Authorization - Number of Visits 12    Progress Note Due on Visit 10    PT Start Time 1347    PT Stop Time 1427    PT Time Calculation (min) 40 min    Equipment Utilized During Treatment Gait belt    Activity Tolerance Patient tolerated treatment well;Patient limited by pain    Behavior During Therapy [facility] for tasks assessed/performed               Past Medical History:  Diagnosis Date   Acquired complex renal cyst 09/25/2019   AKI (acute kidney injury) ([place]) 07/22/2021   Cancer ([place])    renal   Chest pain 70/78/6754   Complication of anesthesia    Decubitus ulcer of sacral area 12/15/2021   Depression    Diabetes mellitus without complication ([place])    Diabetic infection of left foot ([place]) 04/17/2021   Episode of unresponsiveness 12/15/2021   Gas gangrene of foot ([place]) 07/22/2021   Healthcare maintenance 09/25/2019   Hematuria 09/25/2019   History of kidney stones    Lactic acidosis 12/15/2021   Leukocytosis 09/13/2021   Osteomyelitis ([place])    Sacral pressure injury of skin 07/27/2021   Septic shock ([place]) 07/22/2021   Severe sepsis with acute organ dysfunction ([place]) 07/22/2021   Shortness of breath 05/16/2021   Stage 3a chronic kidney disease ([place]) 49/20/1007   Systolic dysfunction    Type 2 diabetes mellitus 10/12/2019   Past Surgical History:  Procedure Laterality Date   AMPUTATION Left 07/23/2021   Procedure: LEFT  BELOW KNEE AMPUTATION;  Surgeon: [name], MD;  Location: [place];  Service: Orthopedics;  Laterality: Left;   BELOW KNEE LEG AMPUTATION Right    BUBBLE STUDY  07/29/2021   Procedure: BUBBLE STUDY;  Surgeon: Sueanne Margarita, MD;  Location: [place];  Service: Cardiovascular;;   CARDIOVERSION N/A 07/29/2021   Procedure: CARDIOVERSION;  Surgeon: Sueanne Margarita, MD;  Location: [facility] ENDOSCOPY;  Service: Cardiovascular;  Laterality: N/A;   RADIOLOGY WITH ANESTHESIA N/A 08/05/2021   Procedure: MRI LUMBAR WITH AND WITHOUT; THORASIC SPINE WITH AND WITHOUT WITH ANESTHESIA;  Surgeon: Radiologist, Medication, MD;  Location: [place];  Service: Radiology;  Laterality: N/A;   RADIOLOGY WITH ANESTHESIA N/A 08/07/2021   Procedure: MRI WITH LUMBER WITH AND WITHOUT CONTRAST,THORACIC WITH AND WITHOUT CONTRAST;  Surgeon: Radiologist, Medication, MD;  Location: [place];  Service: Radiology;  Laterality: N/A;   RADIOLOGY WITH ANESTHESIA N/A 09/13/2021   Procedure: MRI WITH ANESTHESIA;  Surgeon: Luanne Bras, MD;  Location: [place];  Service: Radiology;  Laterality: N/A;   TEE WITHOUT CARDIOVERSION N/A 07/29/2021   Procedure: TRANSESOPHAGEAL ECHOCARDIOGRAM (TEE);  Surgeon: Sueanne Margarita, MD;  [facility];  Service: Cardiovascular;  Laterality: N/A;   Patient Active Problem List   Diagnosis Date Noted   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[place]) 07/24/2022   Type 1  diabetes mellitus with complications ([place]) 62/13/0865   [place] 06/22/2022   Polycythemia 06/22/2022   Amputation below knee ([place]) 04/23/2022   PTSD (post-traumatic stress disorder) 04/23/2022   Generalized anxiety disorder 01/21/2022   Episode of unresponsiveness 12/15/2021   Acute respiratory failure with hypoxia (HCC) secondary to suspected aspiration pneumonia 12/15/2021   Renal mass 12/15/2021   Stage IV pressure ulcer of sacral region ([place]) 09/15/2021   Insomnia 78/46/9629   Chronic systolic CHF (congestive heart failure) ([place]) 09/14/2021    Hypogonadism in male 09/14/2021   Chronic pain 09/14/2021   Pseudohyponatremia 09/13/2021   Normocytic anemia 09/13/2021   Discitis of thoracic region    Paroxysmal atrial flutter ([place]) 07/22/2021   Chronic diastolic (congestive) heart failure ([place]) 05/16/2021   Lumbar spondylosis 03/25/2021   H/O amputation of leg through tibia and fibula ([place]) 11/10/2019   Degeneration of lumbar intervertebral disc 11/10/2019   Hypercholesterolemia 11/10/2019   Left below-knee amputee ([place]) 10/30/2019   Dyslipidemia 10/30/2019   Type 1 diabetes mellitus with diabetic polyneuropathy, with long-term current use of insulin ([place]) 10/12/2019   Right below-elbow amputee 10/12/2019   Hypertensive disorder 09/25/2019   Urinary hesitancy 09/25/2019   Prosthesis adjustments 09/25/2019   Erectile dysfunction 09/25/2019   Palliative care status 09/25/2019   Pulmonary nodules 09/25/2019   Depressive disorder 09/25/2019    REFERRING DIAG: G89.4 (ICD-10-CM) - Chronic pain syndrome   Z89.512 Left Below Knee Amputation, Z89.511 history of right Below Knee Amputation.  THERAPY DIAG:  Unsteadiness on feet  Other abnormalities of gait and mobility  Muscle weakness (generalized)  Other low back pain  Rationale for Evaluation and Treatment Rehabilitation  PERTINENT HISTORY:  DM2, CHF, HTN, chronic pain, bilateral TTAs  PRECAUTIONS: Fall  SUBJECTIVE:                                                                                                                                                                                      SUBJECTIVE STATEMENT:  He has muscle soreness but nothing different than normal.   PAIN:  Are you having pain? Are you having pain? Yes: NPRS scale: today 7-8/10 and in last week ranging 6-10/10 Pain location: middle low back Pain description: sharp Aggravating factors: extension Relieving factors: sitting forward,   OBJECTIVE: (objective measures completed at initial  evaluation unless otherwise dated)   OBJECTIVE:  COGNITION: Overall cognitive status: Within functional limits for tasks assessed   POSTURE: rounded shoulders, forward head, flexed trunk , and weight shift left   LOWER EXTREMITY ROM:   ROM P:passive  A:active Right eval Left eval  Hip flexion      Hip extension Standing A: -15* Standing A: -  15*  Hip abduction      Hip adduction      Hip internal rotation      Hip external rotation      Knee flexion      Knee extension      Ankle dorsiflexion      Ankle plantarflexion      Ankle inversion      Ankle eversion       (Blank rows = not tested)   LOWER EXTREMITY MMT:   MMT Right eval Left eval  Hip flexion 4/5 4/5  Hip extension 3-/5 3-/5  Hip abduction 3/5 3/5  Hip adduction      Hip internal rotation      Hip external rotation      Knee flexion 4/5 4/5  Knee extension 4/5 4/5  Ankle dorsiflexion NA NA  Ankle plantarflexion NA NA  Ankle inversion NA NA  Ankle eversion NA NA  (Blank rows = not tested)   TRANSFERS: Sit to stand: SBA 22" w/c requiring armrests to RW for support for stabilization with poor technique.  Stand to sit: SBA requires RW support to 22" w/c requiring armrests with poor technique.    GAIT: Gait pattern: step to pattern, decreased step length- Right, decreased stance time- Left, Left hip hike, antalgic, trunk flexed, and wide BOS Distance walked: 43' with 90* turn right & left and 180* turn Assistive device utilized: Environmental consultant - 2 wheeled and TTA prosthesis Level of assistance: Min A esp with turns Comments: excessive BUE weight bearing on RW   FUNCTIONAL TESTs:  Berg Balance Scale: 10/56   [facility] PT Assessment - 07/06/22 1300                Standardized Balance Assessment    Standardized Balance Assessment Berg Balance Test          Berg Balance Test    Sit to Stand Needs minimal aid to stand or to stabilize     Standing Unsupported Needs several tries to stand 30 seconds unsupported      Sitting with Back Unsupported but Feet Supported on Floor or Stool Able to sit safely and securely 2 minutes     Stand to Sit Sits independently, has uncontrolled descent     Transfers Able to transfer safely, definite need of hands     Standing Unsupported with Eyes Closed Needs help to keep from falling     Standing Unsupported with Feet Together Needs help to attain position and unable to hold for 15 seconds     From Standing, Reach Forward with Outstretched Arm Loses balance while trying/requires external support     From Standing Position, Pick up Object from Floor Unable to try/needs assist to keep balance     From Standing Position, Turn to Look Behind Over each Shoulder Needs assist to keep from losing balance and falling     Turn 360 Degrees Needs assistance while turning     Standing Unsupported, Alternately Place Feet on Step/Stool Needs assistance to keep from falling or unable to try     Standing Unsupported, One Foot in [employer] balance while stepping or standing     Standing on One Leg Unable to try or needs assist to prevent fall     Total Score 10     Berg comment: BERG  < 36 high risk for falls (close to 100%) 46-51 moderate (>50%)   37-45 significant (>80%) 52-55 lower (> 25%)  CURRENT PROSTHETIC WEAR ASSESSMENT: Patient is dependent with: skin check, residual limb care, prosthetic cleaning, ply sock cleaning, correct ply sock adjustment, proper wear schedule/adjustment, and proper weight-bearing schedule/adjustment Donning prosthesis: SBA / verbal cues Doffing prosthesis: SBA / verbal cues Prosthetic wear tolerance: liner & flexible suction socket for most of awake hours and wears prostheses maybe ~40% of awake hours,  7 days/week Prosthetic weight bearing tolerance: 5 minutes Edema: none noted Residual limb condition: bilateral redness over patella, dry skin, normal color & temperature.  Prosthetic description: suction pin suspension with 25m  silicon liner as interface & flexible inner socket, total contact socket, dynamic response Flex feet       TODAY'S TREATMENT:                                                                                                                             DATE:  07/29/2022 Prosthetic training with bilateral below the knee amputation prostheses: Sit to and from stand from 18 inch chair with armrest to rolling walker with minimal verbal cues. Patient ambulated total of 140 feet with a rolling walker with supervision. Patient negotiated 6 inch curb with rolling walker 1 reps leading with right lower extremity and 1 reps leading with left lower extremity with min guard. Patient negotiated a 12 degree ramp/incline with rolling walker with minimal assistance and verbal cues. Stairs: PT demonstrated and verbal cues on negotiating stairs with bilateral prostheses.  Patient able to descend an ACE in 4 steps using 2 rails with step to pattern alternating lead lower extremity with min assistance.   Neuromuscular reeducation: PT instructed patient and friend who is a caregiver with verbal, demo, tactile and handout cues for updated home exercise program for balance as follows.  Patient verbalized and return demonstration understanding. Access Code: [email or web address] URL: [email or web address] Date: 07/29/2022 Prepared by: RJamey Reas Exercises - standing on floor eyes open head movements  - 1 x daily - 7 x weekly - 1 sets - 5-10 reps - 5 seconds hold - standing on floor head movements eyes closed  - 1 x daily - 7 x weekly - 1 sets - 5-10 reps - 2 seconds hold - stand on foam eyes open head movements  - 1 x daily - 7 x weekly - 1 sets - 5-10 reps - 5 seconds hold  07/28/2022 Prosthetic Training with bilateral Transtibial Prostheses: He reports wearing prostheses all awake hours without issues.  PT recommended having prosthetist trim inner liners so suction sleeve is in contact with skin. PT  instructed in motion of prosthesis for driving. Pt verbalized understanding including practice in large empty parking lot.  Pt neg ramp & curb with RW with supervision. PT demo to pt & friend how to guard for safety. Pt verbalized understanding.   Therapeutic Exercise: Recumbent bike seated on his air cushion 3 min level 1. PT demo & verbal cues on set-up & technique including using sets to build overall tolerance. Pt  verbalized understanding.  Leg Press BLEs 75# 15 reps 2 sets. Pt verbalized understanding.   07/20/2022 Prosthetic Training with bilateral Transtibial Prostheses: Physical therapist reviewed proper donning with standing so residual limb is fully seated into the socket. patient use posture with posterior pelvis against a wall in order to maintain balance with this activity. patient verbalized understanding of why he needed to donne this way. patient has a small red area on his right medial hamstring tendon. physical therapist recommended using mineral oil prior to donning prosthesis and patient verbalized understanding. PT also made patient aware of the fact that the wrinkles in the suction suspension sleeve could cause the abrasion or irritation.  PT reviewed walking program with short medium and long walks.  Patient verbalized understanding and better knowledge of long walks. PT reviewed standing exercises with put back to door frame and back to countertop to work on upright posture.  Patient verbalized understanding PT reviewed with demo and verbal cues on proper negotiation of ramps and curbs.  Patient able to negotiate both ramps and cards with a rolling walker with supervision to contact-guard assistance. -Patient able to go sit to and from stand from chairs without armrest using upper extremities to the rolling walker with verbal cues.    07/08/22 1604  PT Education  Education Details HEP Access Code: [email or web address] and sink program  Person(s) Educated Patient;Other (comment)   Methods Explanation;Demonstration;Tactile cues;Verbal cues;Handout  Comprehension Verbalized understanding;Returned demonstration;Verbal cues required;Tactile cues required;Need further instruction     HOME EXERCISE PROGRAM: Access Code: [email or web address] URL: [email or web address] Date: 07/08/2022 Prepared by: Jamey Reas  Exercises - Sit to Stand with Counter Support  - 2-3 x daily - 7 x weekly - 1 sets - 10 reps - 5 seconds hold - Seated Hamstring Stretch with Strap  - 2-3 x daily - 7 x weekly - 1 sets - 2-3 reps - 20-30 seconds hold   Do each exercise 1-2  times per day Do each exercise 5-10 repetitions Hold each exercise for 2 seconds to feel your location  AT [place].  Try to find this position when standing still for activities.   USE TAPE ON FLOOR TO MARK THE MIDLINE POSITION which is even with middle of sink.  You also should try to feel with your limb pressure in socket.  You are trying to feel with limb what you used to feel with the bottom of your foot.  Side to Side Shift: Moving your hips only (not shoulders): move weight onto your left leg, HOLD/FEEL pressure in socket.  Move back to equal weight on each leg, HOLD/FEEL pressure in socket. Move weight onto your right leg, HOLD/FEEL pressure in socket. Move back to equal weight on each leg, HOLD/FEEL pressure in socket. Repeat.  Start with both hands on sink, progress to right hand only hold, then left hand only hold  Front to Back Shift: Moving your hips only (not shoulders): move your weight forward onto your toes, HOLD/FEEL pressure in socket. Move your weight back to equal Flat Foot on both legs, HOLD/FEEL  pressure in socket. Move your weight back onto your heels, HOLD/FEEL  pressure in socket. Move your weight back to equal on both legs, HOLD/FEEL  pressure in socket. Repeat.  Start with both hands on sink, progress to right hand only hold,  then left hand only hold  Moving Cones / Cups: With equal weight on each leg: Hold on with one  hand the first time, then progress to no hand supports. Move cups from one side of sink to the other. Place cups ~2" out of your reach, progress to 10" beyond reach.  Place one hand in middle of sink and reach with other hand. Do both arms.   Overhead/Upward Reaching: alternated reaching up to top cabinets or ceiling if no cabinets present. Keep equal weight on each leg. Start with one hand support on counter while other hand reaches and progress to no hand support with reaching.  ace one hand in middle of sink and reach with other hand. Do both arms.   5.   Looking Over Shoulders: With equal weight on each leg: alternate turning to look over your shoulders with one hand support on counter as needed.  Start with head motions only to look in front of shoulder, then even with shoulder and progress to looking behind you. To look to side, move head /eyes, then shoulder on side looking pulls back, shift more weight to side looking and pull hip back. Place one hand in middle of sink and let go with other hand so your shoulder can pull back. Switch hands to look other way.   6.  Alternating Stepping or tapping toes into lower cabinet:  Move items under cabinet out of your way. Shift your hips/pelvis so weight on right leg & Tighten muscles in hip on right side.  SLOWLY step left leg so front of foot is in cabinet. Then step back to floor with right hip tight.  Repeat with other leg.     ASSESSMENT:   CLINICAL IMPRESSION: Patient appears to understand updated home exercise program for balance.  Patient is improving his mobility with rolling walker for gait including ramps and curbs.  Patient has a basic understanding of negotiating stairs but will need additional training prior to trying outside therapy.  Pt continues to benefit from skilled PT.    OBJECTIVE IMPAIRMENTS: Abnormal gait, decreased activity tolerance,  decreased balance, decreased knowledge of use of DME, decreased mobility, decreased ROM, decreased strength, impaired flexibility, postural dysfunction, prosthetic dependency , and pain.    ACTIVITY LIMITATIONS: carrying, lifting, bending, sitting, standing, squatting, stairs, transfers, and locomotion level   PARTICIPATION LIMITATIONS: meal prep, cleaning, driving, and community activity   PERSONAL FACTORS: Fitness, Time since onset of injury/illness/exacerbation, and 3+ comorbidities: see PMH  are also affecting patient's functional outcome.    REHAB POTENTIAL: Good   CLINICAL DECISION MAKING: Evolving/moderate complexity   EVALUATION COMPLEXITY: Moderate     GOALS: Goals reviewed with patient? Yes   SHORT TERM GOALS: Target date: 08/06/2022   Patient donnes prosthesis modified independent & verbalizes proper cleaning. Baseline: SEE OBJECTIVE DATA Goal status: INITIAL 2.  Patient verbalizes understanding of initial HEP.  Baseline: SEE OBJECTIVE DATA Goal status: INITIAL   3.  Patient able to stand 60 seconds without UE support and reach 5" anteriorly with supervision.  Baseline: SEE OBJECTIVE DATA Goal status: INITIAL   4. Patient ambulates 14' with RW & prosthesis with supervision. Baseline: SEE OBJECTIVE DATA Goal status: INITIAL   5. Patient negotiates ramps & curbs with RW & prosthesis with minA. Baseline: SEE OBJECTIVE DATA Goal status: INITIAL   LONG TERM GOALS: Target date: 10/01/2022   Patient demonstrates & verbalized understanding of prosthetic care to enable safe utilization of prosthesis. Baseline: SEE OBJECTIVE DATA Goal status: INITIAL   Patient tolerates prostheses wear >90% of awake hours without skin or limb pain issues. Baseline: SEE OBJECTIVE DATA  Goal status: INITIAL   Berg Balance >/= 45/56 to indicate lower fall risk Baseline: SEE OBJECTIVE DATA Goal status: INITIAL   Patient ambulates >300' with prostheses and cane or less  independently Baseline: SEE OBJECTIVE DATA Goal status: INITIAL   Patient negotiates ramps, curbs & stairs with single rail with prostheses and cane or less independently. Baseline: SEE OBJECTIVE DATA Goal status: INITIAL   6.   Patient verbalizes & demonstrates understanding of how to properly use fitness equipment to return to gym. Baseline: SEE OBJECTIVE DATA Goal status: INITIAL   PLAN:   PT FREQUENCY: 2x/week   PT DURATION: 90 days / 13 weeks   PLANNED INTERVENTIONS: Therapeutic exercises, Therapeutic activity, Neuromuscular re-education, Balance training, Gait training, Patient/Family education, Self Care, Stair training, Prosthetic training, DME instructions, Electrical stimulation, Cryotherapy, Moist heat, and physical performance testing.   PLAN FOR NEXT SESSION: Assess STGs, continue work on standing balance facilitating ankle/residual limb & hip strategies, Prosthetic gait with RW on ramps & curbs and stairs.    Jamey Reas, PT, DPT 07/29/2022, 4:18 PM

## 2022-08-03 ENCOUNTER — Encounter: Payer: Self-pay | Admitting: Physical Therapy

## 2022-08-03 ENCOUNTER — Ambulatory Visit (INDEPENDENT_AMBULATORY_CARE_PROVIDER_SITE_OTHER): Payer: Medicare HMO | Admitting: Physical Therapy

## 2022-08-03 DIAGNOSIS — Z7409 Other reduced mobility: Secondary | ICD-10-CM | POA: Diagnosis not present

## 2022-08-03 DIAGNOSIS — R2689 Other abnormalities of gait and mobility: Secondary | ICD-10-CM | POA: Diagnosis not present

## 2022-08-03 DIAGNOSIS — R2681 Unsteadiness on feet: Secondary | ICD-10-CM | POA: Diagnosis not present

## 2022-08-03 DIAGNOSIS — M5459 Other low back pain: Secondary | ICD-10-CM | POA: Diagnosis not present

## 2022-08-03 DIAGNOSIS — M6281 Muscle weakness (generalized): Secondary | ICD-10-CM

## 2022-08-03 NOTE — Therapy (Signed)
OUTPATIENT PHYSICAL THERAPY TREATMENT NOTE   Patient Name: [name] MRN: [id number] DOB:[birth year], 56 y.o., male Today's Date: 08/03/2022  PCP: Loralee Pacas, MD REFERRING PROVIDER: Loralee Pacas, MD  END OF SESSION:   PT End of Session - 08/03/22 1343     Visit Number 8    Number of Visits 26    Date for PT Re-Evaluation 10/01/22    Authorization Type [employer]    Authorization Time Period DED MET AND OOP MET Approved  Authorization #[id number]  Tracking #[id number] pt visits 10/31-1/25/24    Authorization - Visit Number 8    Authorization - Number of Visits 12    Progress Note Due on Visit 10    PT Start Time 7591    PT Stop Time 1430    PT Time Calculation (min) 45 min    Equipment Utilized During Treatment Gait belt    Activity Tolerance Patient tolerated treatment well;Patient limited by pain    Behavior During Therapy [facility] for tasks assessed/performed                Past Medical History:  Diagnosis Date   Acquired complex renal cyst 09/25/2019   AKI (acute kidney injury) ([place]) 07/22/2021   Cancer ([place])    renal   Chest pain 63/84/6659   Complication of anesthesia    Decubitus ulcer of sacral area 12/15/2021   Depression    Diabetes mellitus without complication ([place])    Diabetic infection of left foot ([place]) 04/17/2021   Episode of unresponsiveness 12/15/2021   Gas gangrene of foot ([place]) 07/22/2021   Healthcare maintenance 09/25/2019   Hematuria 09/25/2019   History of kidney stones    Lactic acidosis 12/15/2021   Leukocytosis 09/13/2021   Osteomyelitis ([place])    Sacral pressure injury of skin 07/27/2021   Septic shock ([place]) 07/22/2021   Severe sepsis with acute organ dysfunction ([place]) 07/22/2021   Shortness of breath 05/16/2021   Stage 3a chronic kidney disease ([place]) 93/57/0177   Systolic dysfunction    Type 2 diabetes mellitus 10/12/2019   Past Surgical History:  Procedure Laterality Date   AMPUTATION Left 07/23/2021   Procedure:  LEFT BELOW KNEE AMPUTATION;  Surgeon: [name], MD;  Location: [place];  Service: Orthopedics;  Laterality: Left;   BELOW KNEE LEG AMPUTATION Right    BUBBLE STUDY  07/29/2021   Procedure: BUBBLE STUDY;  Surgeon: Sueanne Margarita, MD;  Location: [place];  Service: Cardiovascular;;   CARDIOVERSION N/A 07/29/2021   Procedure: CARDIOVERSION;  Surgeon: Sueanne Margarita, MD;  Location: [facility] ENDOSCOPY;  Service: Cardiovascular;  Laterality: N/A;   RADIOLOGY WITH ANESTHESIA N/A 08/05/2021   Procedure: MRI LUMBAR WITH AND WITHOUT; THORASIC SPINE WITH AND WITHOUT WITH ANESTHESIA;  Surgeon: Radiologist, Medication, MD;  Location: [place];  Service: Radiology;  Laterality: N/A;   RADIOLOGY WITH ANESTHESIA N/A 08/07/2021   Procedure: MRI WITH LUMBER WITH AND WITHOUT CONTRAST,THORACIC WITH AND WITHOUT CONTRAST;  Surgeon: Radiologist, Medication, MD;  Location: [place];  Service: Radiology;  Laterality: N/A;   RADIOLOGY WITH ANESTHESIA N/A 09/13/2021   Procedure: MRI WITH ANESTHESIA;  Surgeon: Luanne Bras, MD;  Location: [place];  Service: Radiology;  Laterality: N/A;   TEE WITHOUT CARDIOVERSION N/A 07/29/2021   Procedure: TRANSESOPHAGEAL ECHOCARDIOGRAM (TEE);  Surgeon: Sueanne Margarita, MD;  Location[facility];  Service: Cardiovascular;  Laterality: N/A;   Patient Active Problem List   Diagnosis Date Noted   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[place]) 07/24/2022   Type  1 diabetes mellitus with complications ([place]) 41/63/8453   Malaise 06/22/2022   Polycythemia 06/22/2022   Amputation below knee ([place]) 04/23/2022   PTSD (post-traumatic stress disorder) 04/23/2022   Generalized anxiety disorder 01/21/2022   Episode of unresponsiveness 12/15/2021   Acute respiratory failure with hypoxia (HCC) secondary to suspected aspiration pneumonia 12/15/2021   Renal mass 12/15/2021   Stage IV pressure ulcer of sacral region ([place]) 09/15/2021   Insomnia 64/68/0321   Chronic systolic CHF (congestive heart failure) ([place])  09/14/2021   Hypogonadism in male 09/14/2021   Chronic pain 09/14/2021   Pseudohyponatremia 09/13/2021   Normocytic anemia 09/13/2021   Discitis of thoracic region    Paroxysmal atrial flutter ([place]) 07/22/2021   Chronic diastolic (congestive) heart failure ([place]) 05/16/2021   Lumbar spondylosis 03/25/2021   H/O amputation of leg through tibia and fibula ([place]) 11/10/2019   Degeneration of lumbar intervertebral disc 11/10/2019   Hypercholesterolemia 11/10/2019   Left below-knee amputee ([place]) 10/30/2019   Dyslipidemia 10/30/2019   Type 1 diabetes mellitus with diabetic polyneuropathy, with long-term current use of insulin ([place]) 10/12/2019   Right below-elbow amputee 10/12/2019   Hypertensive disorder 09/25/2019   Urinary hesitancy 09/25/2019   Prosthesis adjustments 09/25/2019   Erectile dysfunction 09/25/2019   Palliative care status 09/25/2019   Pulmonary nodules 09/25/2019   Depressive disorder 09/25/2019    REFERRING DIAG: G89.4 (ICD-10-CM) - Chronic pain syndrome   Z89.512 Left Below Knee Amputation, Z89.511 history of right Below Knee Amputation.  THERAPY DIAG:  Unsteadiness on feet  Other abnormalities of gait and mobility  Muscle weakness (generalized)  Impaired functional mobility and activity tolerance  Other low back pain  Rationale for Evaluation and Treatment Rehabilitation  PERTINENT HISTORY:  DM2, CHF, HTN, chronic pain, bilateral TTAs  PRECAUTIONS: Fall  SUBJECTIVE:                                                                                                                                                                                      SUBJECTIVE STATEMENT:  He has working on walking with RW.  He is wearing liners all awake hours but takes prostheses off when in bed to get off buttocks for his wound.    PAIN:  Are you having pain? Are you having pain? Yes: NPRS scale: today 9/10 and in last week ranging 7-10/10 Pain location: middle low  back Pain description: sharp Aggravating factors: extension Relieving factors: sitting forward,   OBJECTIVE: (objective measures completed at initial evaluation unless otherwise dated)   OBJECTIVE:  COGNITION: Overall cognitive status: Within functional limits for tasks assessed   POSTURE: rounded shoulders, forward head, flexed trunk , and weight shift left  LOWER EXTREMITY ROM:   ROM P:passive  A:active Right eval Left eval  Hip flexion      Hip extension Standing A: -15* Standing A: -15*  Hip abduction      Hip adduction      Hip internal rotation      Hip external rotation      Knee flexion      Knee extension      Ankle dorsiflexion      Ankle plantarflexion      Ankle inversion      Ankle eversion       (Blank rows = not tested)   LOWER EXTREMITY MMT:   MMT Right eval Left eval  Hip flexion 4/5 4/5  Hip extension 3-/5 3-/5  Hip abduction 3/5 3/5  Hip adduction      Hip internal rotation      Hip external rotation      Knee flexion 4/5 4/5  Knee extension 4/5 4/5  Ankle dorsiflexion NA NA  Ankle plantarflexion NA NA  Ankle inversion NA NA  Ankle eversion NA NA  (Blank rows = not tested)   TRANSFERS: Sit to stand: SBA 22" w/c requiring armrests to RW for support for stabilization with poor technique.  Stand to sit: SBA requires RW support to 22" w/c requiring armrests with poor technique.    GAIT: Gait pattern: step to pattern, decreased step length- Right, decreased stance time- Left, Left hip hike, antalgic, trunk flexed, and wide BOS Distance walked: 43' with 90* turn right & left and 180* turn Assistive device utilized: Environmental consultant - 2 wheeled and TTA prosthesis Level of assistance: Min A esp with turns Comments: excessive BUE weight bearing on RW   FUNCTIONAL TESTs:  Berg Balance Scale: 10/56   [facility] PT Assessment - 07/06/22 1300                Standardized Balance Assessment    Standardized Balance Assessment Berg Balance Test           Berg Balance Test    Sit to Stand Needs minimal aid to stand or to stabilize     Standing Unsupported Needs several tries to stand 30 seconds unsupported     Sitting with Back Unsupported but Feet Supported on Floor or Stool Able to sit safely and securely 2 minutes     Stand to Sit Sits independently, has uncontrolled descent     Transfers Able to transfer safely, definite need of hands     Standing Unsupported with Eyes Closed Needs help to keep from falling     Standing Unsupported with Feet Together Needs help to attain position and unable to hold for 15 seconds     From Standing, Reach Forward with Outstretched Arm Loses balance while trying/requires external support     From Standing Position, Pick up Object from Floor Unable to try/needs assist to keep balance     From Standing Position, Turn to Look Behind Over each Shoulder Needs assist to keep from losing balance and falling     Turn 360 Degrees Needs assistance while turning     Standing Unsupported, Alternately Place Feet on Step/Stool Needs assistance to keep from falling or unable to try     Standing Unsupported, One Foot in [employer] balance while stepping or standing     Standing on One Leg Unable to try or needs assist to prevent fall     Total Score 10     Berg comment: BERG  <  36 high risk for falls (close to 100%) 46-51 moderate (>50%)   37-45 significant (>80%) 52-55 lower (> 25%)                   CURRENT PROSTHETIC WEAR ASSESSMENT: Patient is dependent with: skin check, residual limb care, prosthetic cleaning, ply sock cleaning, correct ply sock adjustment, proper wear schedule/adjustment, and proper weight-bearing schedule/adjustment Donning prosthesis: SBA / verbal cues Doffing prosthesis: SBA / verbal cues Prosthetic wear tolerance: liner & flexible suction socket for most of awake hours and wears prostheses maybe ~40% of awake hours,  7 days/week Prosthetic weight bearing tolerance: 5 minutes Edema: none  noted Residual limb condition: bilateral redness over patella, dry skin, normal color & temperature.  Prosthetic description: suction pin suspension with 69m silicon liner as interface & flexible inner socket, total contact socket, dynamic response Flex feet       TODAY'S TREATMENT:                                                                                                                             DATE:  08/03/2022 Prosthetic training with bilateral below the knee amputation prostheses: Pt is independent in donning & cleaning.  Pt amb 200' and neg ramp & curb with RW modified independent. PT instructed in 4 point sequencing exercise seated with numbered cards. Pt verbalized & return demo understanding.   Neuromuscular reeducation: Pt able to stand 50 sec & 63 sec without UE support with supervision. PT demo and verbal cues on reaching towards the back wall over the sink to improve standing reach.  Patient verbalized and return demonstration including moving feet back so is a longer reach. PT demo and verbal cues on updated home exercise program with blue Thera-Band upper extremity resistance activities single upper extremity only.  See home exercise program below.  Patient performed 10 reps on each upper extremity for each exercise with rolling walker support with supervision.   07/29/2022 Prosthetic training with bilateral below the knee amputation prostheses: Sit to and from stand from 18 inch chair with armrest to rolling walker with minimal verbal cues. Patient ambulated total of 140 feet with a rolling walker with supervision. Patient negotiated 6 inch curb with rolling walker 1 reps leading with right lower extremity and 1 reps leading with left lower extremity with min guard. Patient negotiated a 12 degree ramp/incline with rolling walker with minimal assistance and verbal cues. Stairs: PT demonstrated and verbal cues on negotiating stairs with bilateral prostheses.  Patient  able to descend an ACE in 4 steps using 2 rails with step to pattern alternating lead lower extremity with min assistance.   Neuromuscular reeducation: PT instructed patient and friend who is a caregiver with verbal, demo, tactile and handout cues for updated home exercise program for balance as follows.  Patient verbalized and return demonstration understanding. Access Code: [email or web address] URL: [email or web address] Date: 07/29/2022 Prepared by: RJamey Reas Exercises - standing on floor eyes  open head movements  - 1 x daily - 7 x weekly - 1 sets - 5-10 reps - 5 seconds hold - standing on floor head movements eyes closed  - 1 x daily - 7 x weekly - 1 sets - 5-10 reps - 2 seconds hold - stand on foam eyes open head movements  - 1 x daily - 7 x weekly - 1 sets - 5-10 reps - 5 seconds hold  07/28/2022 Prosthetic Training with bilateral Transtibial Prostheses: He reports wearing prostheses all awake hours without issues.  PT recommended having prosthetist trim inner liners so suction sleeve is in contact with skin. PT instructed in motion of prosthesis for driving. Pt verbalized understanding including practice in large empty parking lot.  Pt neg ramp & curb with RW with supervision. PT demo to pt & friend how to guard for safety. Pt verbalized understanding.   Therapeutic Exercise: Recumbent bike seated on his air cushion 3 min level 1. PT demo & verbal cues on set-up & technique including using sets to build overall tolerance. Pt verbalized understanding.  Leg Press BLEs 75# 15 reps 2 sets. Pt verbalized understanding.      07/08/22 1604  PT Education  Education Details HEP Access Code: [email or web address] and sink program  Person(s) Educated Patient;Other (comment)  Methods Explanation;Demonstration;Tactile cues;Verbal cues;Handout  Comprehension Verbalized understanding;Returned demonstration;Verbal cues required;Tactile cues required;Need further instruction     HOME EXERCISE  PROGRAM: Access Code: [email or web address] URL: [email or web address] Date: 08/03/2022 Prepared by: Jamey Reas  Exercises - Sit to Stand with Counter Support  - 2-3 x daily - 7 x weekly - 1 sets - 10 reps - 5 seconds hold - Seated Hamstring Stretch with Strap  - 2-3 x daily - 7 x weekly - 1 sets - 2-3 reps - 20-30 seconds hold - Alternating Punch with Resistance  - 1 x daily - 5 x weekly - 1 sets - 10 reps - 5 seconds hold - Standing Single Arm Shoulder Flexion with Posterior Anchored Resistance  - 1 x daily - 5 x weekly - 1 sets - 10 reps - 5 seconds hold - Standing Scapular Protraction with Resistance  - 1 x daily - 7 x weekly - 1 sets - 10 reps - 5 seconds hold - Standing alternate rows with resistance  - 1 x daily - 7 x weekly - 1 sets - 10 reps - 5 seconds hold - Single Arm Shoulder Extension with Anchored Resistance  - 1 x daily - 5 x weekly - 1 sets - 10 reps - 5 seconds hold - Standing Row with Anchored Resistance  - 1 x daily - 7 x weekly - 1 sets - 10 reps - 5 seconds hold - Alternating elbow flexion with resistance  - 1 x daily - 7 x weekly - 1 sets - 10 reps - 5 seconds hold - Standing Bicep Curls with Resistance  - 1 x daily - 7 x weekly - 1 sets - 10 reps - 5 seconds hold   Do each exercise 1-2  times per day Do each exercise 5-10 repetitions Hold each exercise for 2 seconds to feel your location  AT [place].  Try to find this position when standing still for activities.   USE TAPE ON FLOOR TO MARK THE MIDLINE POSITION which is even with middle of sink.  You also should try to feel with your limb pressure in socket.  You are  trying to feel with limb what you used to feel with the bottom of your foot.  Side to Side Shift: Moving your hips only (not shoulders): move weight onto your left leg, HOLD/FEEL pressure in socket.  Move back to equal weight on each leg, HOLD/FEEL pressure in socket. Move weight  onto your right leg, HOLD/FEEL pressure in socket. Move back to equal weight on each leg, HOLD/FEEL pressure in socket. Repeat.  Start with both hands on sink, progress to right hand only hold, then left hand only hold  Front to Back Shift: Moving your hips only (not shoulders): move your weight forward onto your toes, HOLD/FEEL pressure in socket. Move your weight back to equal Flat Foot on both legs, HOLD/FEEL  pressure in socket. Move your weight back onto your heels, HOLD/FEEL  pressure in socket. Move your weight back to equal on both legs, HOLD/FEEL  pressure in socket. Repeat.  Start with both hands on sink, progress to right hand only hold, then left hand only hold  Moving Cones / Cups: With equal weight on each leg: Hold on with one hand the first time, then progress to no hand supports. Move cups from one side of sink to the other. Place cups ~2" out of your reach, progress to 10" beyond reach.  Place one hand in middle of sink and reach with other hand. Do both arms.   Overhead/Upward Reaching: alternated reaching up to top cabinets or ceiling if no cabinets present. Keep equal weight on each leg. Start with one hand support on counter while other hand reaches and progress to no hand support with reaching.  ace one hand in middle of sink and reach with other hand. Do both arms.   5.   Looking Over Shoulders: With equal weight on each leg: alternate turning to look over your shoulders with one hand support on counter as needed.  Start with head motions only to look in front of shoulder, then even with shoulder and progress to looking behind you. To look to side, move head /eyes, then shoulder on side looking pulls back, shift more weight to side looking and pull hip back. Place one hand in middle of sink and let go with other hand so your shoulder can pull back. Switch hands to look other way.   6.  Alternating Stepping or tapping toes into lower cabinet:  Move items under cabinet out of your way.  Shift your hips/pelvis so weight on right leg & Tighten muscles in hip on right side.  SLOWLY step left leg so front of foot is in cabinet. Then step back to floor with right hip tight.  Repeat with other leg.     ASSESSMENT:   CLINICAL IMPRESSION: Patient met all short-term goals for initial 30-day period.  Patient continues to be limited by balance and back pain for standing tolerance.  Patient appears to understand and updated home exercise program issued today but will need further instruction.   OBJECTIVE IMPAIRMENTS: Abnormal gait, decreased activity tolerance, decreased balance, decreased knowledge of use of DME, decreased mobility, decreased ROM, decreased strength, impaired flexibility, postural dysfunction, prosthetic dependency , and pain.    ACTIVITY LIMITATIONS: carrying, lifting, bending, sitting, standing, squatting, stairs, transfers, and locomotion level   PARTICIPATION LIMITATIONS: meal prep, cleaning, driving, and community activity   PERSONAL FACTORS: Fitness, Time since onset of injury/illness/exacerbation, and 3+ comorbidities: see PMH  are also affecting patient's functional outcome.    REHAB POTENTIAL: Good   CLINICAL DECISION MAKING:  Evolving/moderate complexity   EVALUATION COMPLEXITY: Moderate     GOALS: Goals reviewed with patient? Yes   SHORT TERM GOALS: Target date: 08/06/2022   Patient donnes prosthesis modified independent & verbalizes proper cleaning. Baseline: SEE OBJECTIVE DATA Goal status: Met 08/03/2022 2.  Patient verbalizes understanding of initial HEP.  Baseline: SEE OBJECTIVE DATA Goal status: Met 08/03/2022   3.  Patient able to stand 60 seconds without UE support and reach 5" anteriorly with supervision.  Baseline: SEE OBJECTIVE DATA Goal status: Partially Met 08/03/2022   4. Patient ambulates 45' with RW & prosthesis with supervision. Baseline: SEE OBJECTIVE DATA Goal status: Met 08/03/2022   5. Patient negotiates ramps & curbs  with RW & prosthesis with minA. Baseline: SEE OBJECTIVE DATA Goal status: Met 08/03/2022   LONG TERM GOALS: Target date: 10/01/2022   Patient demonstrates & verbalized understanding of prosthetic care to enable safe utilization of prosthesis. Baseline: SEE OBJECTIVE DATA Goal status: INITIAL   Patient tolerates prostheses wear >90% of awake hours without skin or limb pain issues. Baseline: SEE OBJECTIVE DATA Goal status: INITIAL   Berg Balance >/= 45/56 to indicate lower fall risk Baseline: SEE OBJECTIVE DATA Goal status: INITIAL   Patient ambulates >300' with prostheses and cane or less independently Baseline: SEE OBJECTIVE DATA Goal status: INITIAL   Patient negotiates ramps, curbs & stairs with single rail with prostheses and cane or less independently. Baseline: SEE OBJECTIVE DATA Goal status: INITIAL   6.   Patient verbalizes & demonstrates understanding of how to properly use fitness equipment to return to gym. Baseline: SEE OBJECTIVE DATA Goal status: INITIAL   PLAN:   PT FREQUENCY: 2x/week   PT DURATION: 90 days / 13 weeks   PLANNED INTERVENTIONS: Therapeutic exercises, Therapeutic activity, Neuromuscular re-education, Balance training, Gait training, Patient/Family education, Self Care, Stair training, Prosthetic training, DME instructions, Electrical stimulation, Cryotherapy, Moist heat, and physical performance testing.   PLAN FOR NEXT SESSION: check on updated HEP.  continue work on standing balance facilitating ankle/residual limb & hip strategies, trial Prosthetic gait with crutches.   Jamey Reas, PT, DPT 08/03/2022, 4:18 PM

## 2022-08-05 ENCOUNTER — Telehealth: Payer: Self-pay | Admitting: Physical Therapy

## 2022-08-05 ENCOUNTER — Encounter: Payer: Self-pay | Admitting: Physical Therapy

## 2022-08-05 ENCOUNTER — Ambulatory Visit (INDEPENDENT_AMBULATORY_CARE_PROVIDER_SITE_OTHER): Payer: Medicare HMO | Admitting: Physical Therapy

## 2022-08-05 DIAGNOSIS — R2681 Unsteadiness on feet: Secondary | ICD-10-CM | POA: Diagnosis not present

## 2022-08-05 DIAGNOSIS — R2689 Other abnormalities of gait and mobility: Secondary | ICD-10-CM

## 2022-08-05 DIAGNOSIS — Z7409 Other reduced mobility: Secondary | ICD-10-CM

## 2022-08-05 DIAGNOSIS — M5136 Other intervertebral disc degeneration, lumbar region: Secondary | ICD-10-CM | POA: Diagnosis not present

## 2022-08-05 DIAGNOSIS — M5459 Other low back pain: Secondary | ICD-10-CM

## 2022-08-05 DIAGNOSIS — M6281 Muscle weakness (generalized): Secondary | ICD-10-CM

## 2022-08-05 NOTE — Telephone Encounter (Signed)
This patient is ambulating with physical therapy with tall adult forearm crutches and appears to had the potential to utilize these outside of physical therapy. Can you please place an order for tall adult forearm crutches in the system? Please contact me with any questions at [phone] or email [email or web address] Thank you

## 2022-08-05 NOTE — Therapy (Signed)
OUTPATIENT PHYSICAL THERAPY TREATMENT NOTE   Patient Name: [name] MRN: [id number] DOB:[birth year], 56 y.o., male Today's Date: 08/05/2022  PCP: Loralee Pacas, MD REFERRING PROVIDER: Loralee Pacas, MD  END OF SESSION:   PT End of Session - 08/05/22 1351     Visit Number 9    Number of Visits 26    Date for PT Re-Evaluation 10/01/22    Authorization Type [employer]    Authorization Time Period DED MET AND OOP MET Approved  Authorization #[id number]  Tracking #[id number] pt visits 10/31-1/25/24    Authorization - Visit Number 9    Authorization - Number of Visits 12    Progress Note Due on Visit 10    PT Start Time 8182    PT Stop Time 1430    PT Time Calculation (min) 45 min    Equipment Utilized During Treatment Gait belt    Activity Tolerance Patient tolerated treatment well;Patient limited by pain    Behavior During Therapy [facility] for tasks assessed/performed                Past Medical History:  Diagnosis Date   Acquired complex renal cyst 09/25/2019   AKI (acute kidney injury) ([place]) 07/22/2021   Cancer ([place])    renal   Chest pain 99/37/1696   Complication of anesthesia    Decubitus ulcer of sacral area 12/15/2021   Depression    Diabetes mellitus without complication ([place])    Diabetic infection of left foot ([place]) 04/17/2021   Episode of unresponsiveness 12/15/2021   Gas gangrene of foot ([place]) 07/22/2021   Healthcare maintenance 09/25/2019   Hematuria 09/25/2019   History of kidney stones    Lactic acidosis 12/15/2021   Leukocytosis 09/13/2021   Osteomyelitis ([place])    Sacral pressure injury of skin 07/27/2021   Septic shock ([place]) 07/22/2021   Severe sepsis with acute organ dysfunction ([place]) 07/22/2021   Shortness of breath 05/16/2021   Stage 3a chronic kidney disease ([place]) 78/93/8101   Systolic dysfunction    Type 2 diabetes mellitus 10/12/2019   Past Surgical History:  Procedure Laterality Date   AMPUTATION Left 07/23/2021   Procedure:  LEFT BELOW KNEE AMPUTATION;  Surgeon: [name], MD;  Location: [place];  Service: Orthopedics;  Laterality: Left;   BELOW KNEE LEG AMPUTATION Right    BUBBLE STUDY  07/29/2021   Procedure: BUBBLE STUDY;  Surgeon: Sueanne Margarita, MD;  Location: [place];  Service: Cardiovascular;;   CARDIOVERSION N/A 07/29/2021   Procedure: CARDIOVERSION;  Surgeon: Sueanne Margarita, MD;  Location: [facility] ENDOSCOPY;  Service: Cardiovascular;  Laterality: N/A;   RADIOLOGY WITH ANESTHESIA N/A 08/05/2021   Procedure: MRI LUMBAR WITH AND WITHOUT; THORASIC SPINE WITH AND WITHOUT WITH ANESTHESIA;  Surgeon: Radiologist, Medication, MD;  Location: [employer];  Service: Radiology;  Laterality: N/A;   RADIOLOGY WITH ANESTHESIA N/A 08/07/2021   Procedure: MRI WITH LUMBER WITH AND WITHOUT CONTRAST,THORACIC WITH AND WITHOUT CONTRAST;  Surgeon: Radiologist, Medication, MD;  Location: [place];  Service: Radiology;  Laterality: N/A;   RADIOLOGY WITH ANESTHESIA N/A 09/13/2021   Procedure: MRI WITH ANESTHESIA;  Surgeon: Luanne Bras, MD;  Location: [place];  Service: Radiology;  Laterality: N/A;   TEE WITHOUT CARDIOVERSION N/A 07/29/2021   Procedure: TRANSESOPHAGEAL ECHOCARDIOGRAM (TEE);  Surgeon: Sueanne Margarita, MD;  Location: [facility] ENDOSCOPY;  Service: Cardiovascular;  Laterality: N/A;   Patient Active Problem List   Diagnosis Date Noted   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[place]) 07/24/2022   Type  1 diabetes mellitus with complications ([place]) 60/73/7106   Malaise 06/22/2022   Polycythemia 06/22/2022   Amputation below knee ([place]) 04/23/2022   PTSD (post-traumatic stress disorder) 04/23/2022   Generalized anxiety disorder 01/21/2022   Episode of unresponsiveness 12/15/2021   Acute respiratory failure with hypoxia (HCC) secondary to suspected aspiration pneumonia 12/15/2021   Renal mass 12/15/2021   Stage IV pressure ulcer of sacral region ([place]) 09/15/2021   Insomnia 26/94/8546   Chronic systolic CHF (congestive heart failure) ([place])  09/14/2021   Hypogonadism in male 09/14/2021   Chronic pain 09/14/2021   Pseudohyponatremia 09/13/2021   Normocytic anemia 09/13/2021   Discitis of thoracic region    Paroxysmal atrial flutter ([place]) 07/22/2021   Chronic diastolic (congestive) heart failure ([place]) 05/16/2021   Lumbar spondylosis 03/25/2021   H/O amputation of leg through tibia and fibula ([place]) 11/10/2019   Degeneration of lumbar intervertebral disc 11/10/2019   Hypercholesterolemia 11/10/2019   Left below-knee amputee ([place]) 10/30/2019   Dyslipidemia 10/30/2019   Type 1 diabetes mellitus with diabetic polyneuropathy, with long-term current use of insulin ([place]) 10/12/2019   Right below-elbow amputee 10/12/2019   Hypertensive disorder 09/25/2019   Urinary hesitancy 09/25/2019   Prosthesis adjustments 09/25/2019   Erectile dysfunction 09/25/2019   Palliative care status 09/25/2019   Pulmonary nodules 09/25/2019   Depressive disorder 09/25/2019    REFERRING DIAG: G89.4 (ICD-10-CM) - Chronic pain syndrome   Z89.512 Left Below Knee Amputation, Z89.511 history of right Below Knee Amputation.  THERAPY DIAG:  Unsteadiness on feet  Other abnormalities of gait and mobility  Muscle weakness (generalized)  Impaired functional mobility and activity tolerance  Other low back pain  Rationale for Evaluation and Treatment Rehabilitation  PERTINENT HISTORY:  DM2, CHF, HTN, chronic pain, bilateral TTAs  PRECAUTIONS: Fall  SUBJECTIVE:                                                                                                                                                                                      SUBJECTIVE STATEMENT:  His sacral wound is red.     PAIN:  Are you having pain? Are you having pain? Yes: NPRS scale: today 8-9/10 and in last week ranging 7-10/10 Pain location: middle low back Pain description: sharp Aggravating factors: extension Relieving factors: sitting forward,   OBJECTIVE: (objective  measures completed at initial evaluation unless otherwise dated)   OBJECTIVE:  COGNITION: Overall cognitive status: Within functional limits for tasks assessed   POSTURE: rounded shoulders, forward head, flexed trunk , and weight shift left   LOWER EXTREMITY ROM:   ROM P:passive  A:active Right eval Left eval  Hip flexion      Hip  extension Standing A: -15* Standing A: -15*  Hip abduction      Hip adduction      Hip internal rotation      Hip external rotation      Knee flexion      Knee extension      Ankle dorsiflexion      Ankle plantarflexion      Ankle inversion      Ankle eversion       (Blank rows = not tested)   LOWER EXTREMITY MMT:   MMT Right eval Left eval  Hip flexion 4/5 4/5  Hip extension 3-/5 3-/5  Hip abduction 3/5 3/5  Hip adduction      Hip internal rotation      Hip external rotation      Knee flexion 4/5 4/5  Knee extension 4/5 4/5  Ankle dorsiflexion NA NA  Ankle plantarflexion NA NA  Ankle inversion NA NA  Ankle eversion NA NA  (Blank rows = not tested)   TRANSFERS: Sit to stand: SBA 22" w/c requiring armrests to RW for support for stabilization with poor technique.  Stand to sit: SBA requires RW support to 22" w/c requiring armrests with poor technique.    GAIT: Gait pattern: step to pattern, decreased step length- Right, decreased stance time- Left, Left hip hike, antalgic, trunk flexed, and wide BOS Distance walked: 68' with 90* turn right & left and 180* turn Assistive device utilized: Environmental consultant - 2 wheeled and TTA prosthesis Level of assistance: Min A esp with turns Comments: excessive BUE weight bearing on RW   FUNCTIONAL TESTs:  Berg Balance Scale: 10/56   [facility] PT Assessment - 07/06/22 1300                Standardized Balance Assessment    Standardized Balance Assessment Berg Balance Test          Berg Balance Test    Sit to Stand Needs minimal aid to stand or to stabilize     Standing Unsupported Needs several tries to  stand 30 seconds unsupported     Sitting with Back Unsupported but Feet Supported on Floor or Stool Able to sit safely and securely 2 minutes     Stand to Sit Sits independently, has uncontrolled descent     Transfers Able to transfer safely, definite need of hands     Standing Unsupported with Eyes Closed Needs help to keep from falling     Standing Unsupported with Feet Together Needs help to attain position and unable to hold for 15 seconds     From Standing, Reach Forward with Outstretched Arm Loses balance while trying/requires external support     From Standing Position, Pick up Object from Floor Unable to try/needs assist to keep balance     From Standing Position, Turn to Look Behind Over each Shoulder Needs assist to keep from losing balance and falling     Turn 360 Degrees Needs assistance while turning     Standing Unsupported, Alternately Place Feet on Step/Stool Needs assistance to keep from falling or unable to try     Standing Unsupported, One Foot in [employer] balance while stepping or standing     Standing on One Leg Unable to try or needs assist to prevent fall     Total Score 10     Berg comment: BERG  < 36 high risk for falls (close to 100%) 46-51 moderate (>50%)   37-45 significant (>80%) 52-55 lower (> 25%)  CURRENT PROSTHETIC WEAR ASSESSMENT: Patient is dependent with: skin check, residual limb care, prosthetic cleaning, ply sock cleaning, correct ply sock adjustment, proper wear schedule/adjustment, and proper weight-bearing schedule/adjustment Donning prosthesis: SBA / verbal cues Doffing prosthesis: SBA / verbal cues Prosthetic wear tolerance: liner & flexible suction socket for most of awake hours and wears prostheses maybe ~40% of awake hours,  7 days/week Prosthetic weight bearing tolerance: 5 minutes Edema: none noted Residual limb condition: bilateral redness over patella, dry skin, normal color & temperature.  Prosthetic description:  suction pin suspension with 20m silicon liner as interface & flexible inner socket, total contact socket, dynamic response Flex feet       TODAY'S TREATMENT:                                                                                                                             DATE:  08/05/2022: Prosthetic training with bilateral below the knee amputation prostheses: PT demo and verbal cues on sit to and from stand using crutches.  Patient performed wheelchair to axillary crutches with min assist and 2 forearm crutches with min assist. PT reviewed 4 point gait sequence.  Patient ambulated 75 feet with axillary crutches using a 4 point gait pattern with moderate assist.  Patient ambulated 75 feet with forearm crutches using a 4 point gait pattern with minimal assistance. Patient reports that he feels more comfortable with forearm crutches.  Patient stability appeared safer with forearm crutches compared to axillary crutches. PT instructed patient that he will need tall adult forearm crutches due to his height.  The crutches need to be the appropriate height in order to facilitate an upright posture and better balance.  08/03/2022 Prosthetic training with bilateral below the knee amputation prostheses: Pt is independent in donning & cleaning.  Pt amb 200' and neg ramp & curb with RW modified independent. PT instructed in 4 point sequencing exercise seated with numbered cards. Pt verbalized & return demo understanding.   Neuromuscular reeducation: Pt able to stand 50 sec & 63 sec without UE support with supervision. PT demo and verbal cues on reaching towards the back wall over the sink to improve standing reach.  Patient verbalized and return demonstration including moving feet back so is a longer reach. PT demo and verbal cues on updated home exercise program with blue Thera-Band upper extremity resistance activities single upper extremity only.  See home exercise program below.  Patient  performed 10 reps on each upper extremity for each exercise with rolling walker support with supervision.   07/29/2022 Prosthetic training with bilateral below the knee amputation prostheses: Sit to and from stand from 18 inch chair with armrest to rolling walker with minimal verbal cues. Patient ambulated total of 140 feet with a rolling walker with supervision. Patient negotiated 6 inch curb with rolling walker 1 reps leading with right lower extremity and 1 reps leading with left lower extremity with min guard. Patient negotiated a 12 degree ramp/incline  with rolling walker with minimal assistance and verbal cues. Stairs: PT demonstrated and verbal cues on negotiating stairs with bilateral prostheses.  Patient able to descend an ACE in 4 steps using 2 rails with step to pattern alternating lead lower extremity with min assistance.   Neuromuscular reeducation: PT instructed patient and friend who is a caregiver with verbal, demo, tactile and handout cues for updated home exercise program for balance as follows.  Patient verbalized and return demonstration understanding. Access Code: [email or web address] URL: [email or web address] Date: 07/29/2022 Prepared by: Jamey Reas  Exercises - standing on floor eyes open head movements  - 1 x daily - 7 x weekly - 1 sets - 5-10 reps - 5 seconds hold - standing on floor head movements eyes closed  - 1 x daily - 7 x weekly - 1 sets - 5-10 reps - 2 seconds hold - stand on foam eyes open head movements  - 1 x daily - 7 x weekly - 1 sets - 5-10 reps - 5 seconds hold    07/08/22 1604  PT Education  Education Details HEP Access Code: [email or web address] and sink program  Person(s) Educated Patient;Other (comment)  Methods Explanation;Demonstration;Tactile cues;Verbal cues;Handout  Comprehension Verbalized understanding;Returned demonstration;Verbal cues required;Tactile cues required;Need further instruction     HOME EXERCISE PROGRAM: Access Code:  [email or web address] URL: [email or web address] Date: 07/29/2022 Prepared by: Jamey Reas  Exercises - standing on floor eyes open head movements  - 1 x daily - 7 x weekly - 1 sets - 5-10 reps - 5 seconds hold - standing on floor head movements eyes closed  - 1 x daily - 7 x weekly - 1 sets - 5-10 reps - 2 seconds hold - stand on foam eyes open head movements  - 1 x daily - 7 x weekly - 1 sets - 5-10 reps - 5 seconds hold  Access Code: [email or web address] URL: [email or web address] Date: 08/03/2022 Prepared by: Jamey Reas  Exercises - Sit to Stand with Counter Support  - 2-3 x daily - 7 x weekly - 1 sets - 10 reps - 5 seconds hold - Seated Hamstring Stretch with Strap  - 2-3 x daily - 7 x weekly - 1 sets - 2-3 reps - 20-30 seconds hold - Alternating Punch with Resistance  - 1 x daily - 5 x weekly - 1 sets - 10 reps - 5 seconds hold - Standing Single Arm Shoulder Flexion with Posterior Anchored Resistance  - 1 x daily - 5 x weekly - 1 sets - 10 reps - 5 seconds hold - Standing Scapular Protraction with Resistance  - 1 x daily - 7 x weekly - 1 sets - 10 reps - 5 seconds hold - Standing alternate rows with resistance  - 1 x daily - 7 x weekly - 1 sets - 10 reps - 5 seconds hold - Single Arm Shoulder Extension with Anchored Resistance  - 1 x daily - 5 x weekly - 1 sets - 10 reps - 5 seconds hold - Standing Row with Anchored Resistance  - 1 x daily - 7 x weekly - 1 sets - 10 reps - 5 seconds hold - Alternating elbow flexion with resistance  - 1 x daily - 7 x weekly - 1 sets - 10 reps - 5 seconds hold - Standing Bicep Curls with Resistance  - 1 x daily - 7 x weekly - 1 sets - 10 reps - 5 seconds hold   Do each exercise 1-2  times per day Do each exercise 5-10  repetitions Hold each exercise for 2 seconds to feel your location  AT [place].  Try to find this position when standing still for activities.   USE TAPE  ON FLOOR TO MARK THE MIDLINE POSITION which is even with middle of sink.  You also should try to feel with your limb pressure in socket.  You are trying to feel with limb what you used to feel with the bottom of your foot.  Side to Side Shift: Moving your hips only (not shoulders): move weight onto your left leg, HOLD/FEEL pressure in socket.  Move back to equal weight on each leg, HOLD/FEEL pressure in socket. Move weight onto your right leg, HOLD/FEEL pressure in socket. Move back to equal weight on each leg, HOLD/FEEL pressure in socket. Repeat.  Start with both hands on sink, progress to right hand only hold, then left hand only hold  Front to Back Shift: Moving your hips only (not shoulders): move your weight forward onto your toes, HOLD/FEEL pressure in socket. Move your weight back to equal Flat Foot on both legs, HOLD/FEEL  pressure in socket. Move your weight back onto your heels, HOLD/FEEL  pressure in socket. Move your weight back to equal on both legs, HOLD/FEEL  pressure in socket. Repeat.  Start with both hands on sink, progress to right hand only hold, then left hand only hold  Moving Cones / Cups: With equal weight on each leg: Hold on with one hand the first time, then progress to no hand supports. Move cups from one side of sink to the other. Place cups ~2" out of your reach, progress to 10" beyond reach.  Place one hand in middle of sink and reach with other hand. Do both arms.   Overhead/Upward Reaching: alternated reaching up to top cabinets or ceiling if no cabinets present. Keep equal weight on each leg. Start with one hand support on counter while other hand reaches and progress to no hand support with reaching.  ace one hand in middle of sink and reach with other hand. Do both arms.   5.   Looking Over Shoulders: With equal weight on each leg: alternate turning to look over your shoulders with one hand support on counter as needed.  Start with head motions only to look in front of  shoulder, then even with shoulder and progress to looking behind you. To look to side, move head /eyes, then shoulder on side looking pulls back, shift more weight to side looking and pull hip back. Place one hand in middle of sink and let go with other hand so your shoulder can pull back. Switch hands to look other way.   6.  Alternating Stepping or tapping toes into lower cabinet:  Move items under cabinet out of your way. Shift your hips/pelvis so weight on right leg & Tighten muscles in hip on right side.  SLOWLY step left leg so front of foot is in cabinet. Then step back to floor with right hip tight.  Repeat with other leg.     ASSESSMENT:   CLINICAL IMPRESSION: PT introduced gait with crutches including sit to and from stand.  Patient has potential to use crutches with further training from PT.   OBJECTIVE IMPAIRMENTS: Abnormal gait, decreased activity tolerance, decreased balance, decreased knowledge of use of DME, decreased mobility, decreased ROM, decreased strength, impaired flexibility, postural dysfunction, prosthetic dependency , and pain.    ACTIVITY LIMITATIONS: carrying,  lifting, bending, sitting, standing, squatting, stairs, transfers, and locomotion level   PARTICIPATION LIMITATIONS: meal prep, cleaning, driving, and community activity   PERSONAL FACTORS: Fitness, Time since onset of injury/illness/exacerbation, and 3+ comorbidities: see PMH  are also affecting patient's functional outcome.    REHAB POTENTIAL: Good   CLINICAL DECISION MAKING: Evolving/moderate complexity   EVALUATION COMPLEXITY: Moderate     GOALS: Goals reviewed with patient? Yes   SHORT TERM GOALS: Target date: 08/06/2022   Patient donnes prosthesis modified independent & verbalizes proper cleaning. Baseline: SEE OBJECTIVE DATA Goal status: Met 08/03/2022 2.  Patient verbalizes understanding of initial HEP.  Baseline: SEE OBJECTIVE DATA Goal status: Met 08/03/2022   3.  Patient able to stand  60 seconds without UE support and reach 5" anteriorly with supervision.  Baseline: SEE OBJECTIVE DATA Goal status: Partially Met 08/03/2022   4. Patient ambulates 68' with RW & prosthesis with supervision. Baseline: SEE OBJECTIVE DATA Goal status: Met 08/03/2022   5. Patient negotiates ramps & curbs with RW & prosthesis with minA. Baseline: SEE OBJECTIVE DATA Goal status: Met 08/03/2022   LONG TERM GOALS: Target date: 10/01/2022   Patient demonstrates & verbalized understanding of prosthetic care to enable safe utilization of prosthesis. Baseline: SEE OBJECTIVE DATA Goal status: INITIAL   Patient tolerates prostheses wear >90% of awake hours without skin or limb pain issues. Baseline: SEE OBJECTIVE DATA Goal status: INITIAL   Berg Balance >/= 45/56 to indicate lower fall risk Baseline: SEE OBJECTIVE DATA Goal status: INITIAL   Patient ambulates >300' with prostheses and cane or less independently Baseline: SEE OBJECTIVE DATA Goal status: INITIAL   Patient negotiates ramps, curbs & stairs with single rail with prostheses and cane or less independently. Baseline: SEE OBJECTIVE DATA Goal status: INITIAL   6.   Patient verbalizes & demonstrates understanding of how to properly use fitness equipment to return to gym. Baseline: SEE OBJECTIVE DATA Goal status: INITIAL   PLAN:   PT FREQUENCY: 2x/week   PT DURATION: 90 days / 13 weeks   PLANNED INTERVENTIONS: Therapeutic exercises, Therapeutic activity, Neuromuscular re-education, Balance training, Gait training, Patient/Family education, Self Care, Stair training, Prosthetic training, DME instructions, Electrical stimulation, Cryotherapy, Moist heat, and physical performance testing.   PLAN FOR NEXT SESSION: continue work on standing balance facilitating ankle/residual limb & hip strategies, Prosthetic gait with forearm crutches.   Jamey Reas, PT, DPT 08/05/2022, 4:40 PM

## 2022-08-07 ENCOUNTER — Encounter (HOSPITAL_BASED_OUTPATIENT_CLINIC_OR_DEPARTMENT_OTHER): Payer: Medicare HMO | Attending: General Surgery | Admitting: General Surgery

## 2022-08-07 DIAGNOSIS — L89154 Pressure ulcer of sacral region, stage 4: Secondary | ICD-10-CM | POA: Insufficient documentation

## 2022-08-07 DIAGNOSIS — G8929 Other chronic pain: Secondary | ICD-10-CM | POA: Insufficient documentation

## 2022-08-07 DIAGNOSIS — Z89511 Acquired absence of right leg below knee: Secondary | ICD-10-CM | POA: Insufficient documentation

## 2022-08-07 DIAGNOSIS — Z89512 Acquired absence of left leg below knee: Secondary | ICD-10-CM | POA: Diagnosis not present

## 2022-08-07 DIAGNOSIS — I509 Heart failure, unspecified: Secondary | ICD-10-CM | POA: Insufficient documentation

## 2022-08-07 DIAGNOSIS — I110 Hypertensive heart disease with heart failure: Secondary | ICD-10-CM | POA: Diagnosis not present

## 2022-08-07 DIAGNOSIS — E1151 Type 2 diabetes mellitus with diabetic peripheral angiopathy without gangrene: Secondary | ICD-10-CM | POA: Insufficient documentation

## 2022-08-07 DIAGNOSIS — E11622 Type 2 diabetes mellitus with other skin ulcer: Secondary | ICD-10-CM | POA: Diagnosis not present

## 2022-08-07 NOTE — Progress Notes (Signed)
SRIANSH, [name] ([id number]) [id number] Page 1 of 10 Visit Report for 08/07/2022 Chief Complaint Document Details Patient Name: Date of Service: [place], [name] 08/07/2022 9:15 A M Medical Record Number: [id number] Patient Account Number: [id number] Date of Birth/Sex: Treating RN: Dec 24, 1965 (56 y.o. M) Primary Care Provider: Berniece Pap Other Clinician: Referring Provider: Treating Provider/Extender: Lannie Fields in Treatment: 30 Information Obtained from: Patient Chief Complaint Patient is at the clinic for treatment of an open pressure ulcer Electronic Signature(s) Signed: 08/07/2022 9:31:41 AM By: [name] MD FACS Entered By: [name] on 08/07/2022 09:31:41 -------------------------------------------------------------------------------- Debridement Details Patient Name: Date of Service: [name], [name] 08/07/2022 9:15 A M Medical Record Number: [id number] Patient Account Number: [id number] Date of Birth/Sex: Treating RN: 1965/10/20 (56 y.o. [name] Primary Care Provider: Berniece Pap Other Clinician: Referring Provider: Treating Provider/Extender: Lannie Fields in Treatment: 30 Debridement Performed for Assessment: Wound #1 Sacrum Performed By: Physician [name], MD Debridement Type: Debridement Level of Consciousness (Pre-procedure): Awake and Alert Pre-procedure Verification/Time Out Yes - 09:20 Taken: Start Time: 09:20 Pain Control: Lidocaine 4% Topical Solution T Area Debrided (L x W): otal 1 (cm) x 1 (cm) = 1 (cm) Tissue and other material debrided: Non-Viable, Skin: Epidermis Level: Skin/Epidermis Debridement Description: Selective/Open Wound Instrument: Curette Bleeding: Minimum Hemostasis Achieved: Pressure Response to Treatment: Procedure was tolerated well Level of Consciousness (Post- Awake and Alert procedure): Post Debridement  Measurements of Total Wound Length: (cm) 1 Stage: Category/Stage IV Width: (cm) 1 Depth: (cm) 0.8 Volume: (cm) 0.628 Character of Wound/Ulcer Post Debridement: Improved Post Procedure Diagnosis Same as Robbie Lis ([id number]) [id number] Page 2 of 10 Notes scribed for Dr. Celine Ahr by Adline Peals, RN Electronic Signature(s) Signed: 08/07/2022 10:59:49 AM By: [name] MD FACS Signed: 08/07/2022 3:37:43 PM By: Sabas Sous By: Adline Peals on 08/07/2022 09:24:55 -------------------------------------------------------------------------------- HPI Details Patient Name: Date of Service: [name], [name] 08/07/2022 9:15 A M Medical Record Number: [id number] Patient Account Number: [id number] Date of Birth/Sex: Treating RN: 27-Feb-1966 (56 y.o. M) Primary Care Provider: Berniece Pap Other Clinician: Referring Provider: Treating Provider/Extender: Lannie Fields in Treatment: 67 History of Present Illness HPI Description: ADMISSION 01/08/2022 This is a 56 year old male with a past medical history notable for type 2 diabetes mellitus (last A1c was 8.6) congestive heart failure, hypertension, chronic pain, and bilateral below-knee amputations. His most recent amputation was in November 2022. While he was in the hospital, he developed a sacral pressure ulcer. He was subsequently in a skilled nursing facility for some time. He was discharged with home health and had been in a wound VAC, but was then admitted to the hospital last week when the wound appeared to be worsening. Apparently the periwound skin was macerated and the device that he had been using was leaking quite a bit. Evaluation while in the hospital included a consultation with infectious disease who did not think he had any evidence of osteomyelitis, plastic surgery who felt that he was not an appropriate flap candidate (their note  also states that he is not interested in a flap) and wound care who took him out of the wound VAC and initiated wet-to-dry dressing changes. He has a new wound VAC from [facility] on order, anticipated delivery today. The wound itself is fairly small and isolated to the sacrum. There is muscle exposed. No bone is appreciated but it is palpable beneath the surface. The muscle itself is bit pale and there is  heaped up epibole around the wound edges. No significant odor or drainage. 01/14/2022: His wound VAC was not initiated until this past Friday. He has not had any issues with the VAC but today we found that the bridge foam was applied directly to the skin rather than over a layer of adhesive drape. His home health nurse also requested that we consider applying silver collagen to the wound bed in addition to the VAC. There is still a little bit of heaped up senescent skin around the wound periphery. No significant change to the wound dimensions. 01/21/2022: No significant change to the wound dimensions. The senescent skin has not reaccumulated. The periwound skin remains a bit macerated but without any obvious breakdown. The wound surface itself has a shiny appearance with a little bit of slough accumulation; no true robust granulation tissue at this time. 01/28/2022: The wound is about the same size but a little bit shallower. There is a little bit of senescent skin reaccumulation at the cranial aspect. The periwound skin is red but not macerated and without any tissue breakdown. The wound still does not have the most robust surface. There is a bit of slough accumulation. 02/04/2022: The wound is a bit smaller and the undermining has come in somewhat. There continues to be granulation tissue formation within the wound bed. No significant slough accumulation and his periwound skin is in better condition. 02/18/2022: The wound stinks today. There is no obvious pus but the drainage and wound itself are malodorous.  He continues to have heaped up tissue within the wound bed that is rather grayish and not particularly robust-appearing. He is very angry today with his situation. 02/25/2022: Last week, in response to the odor coming from the wound, I took a culture and prescribed topical gentamicin as well as oral cefdinir. Apparently [facility] contacted him about a topical compounded antibiotic, but he did not realize this and he hung up on them. Today, the odor is no longer present. There is some senescent skin around the wound margins as well as continued heaped up granulation tissue in the center of the wound. The undermining continues to contract. 03/04/2022: The wound continues to contract and look better. He still has heaped up hypertrophic granulation tissue near the orifice. No odor was coming from the wound today. He is awaiting [place] delivery. 03/18/2022: 2-week follow-up. [facility] topical antibiotic has been initiated. The chemical cauterization of the hypertrophic granulation tissue was quite successful and the surface is much flatter today. He has heaped up senescent skin around the perimeter. His home health providers have figured out a way to keep the wound VAC from losing suction by bolstering with DuoDERM. Overall there has been significant improvement since our last visit. 04/01/2022: 2-week follow-up. The wound continues to contract. Once again, there is heaped up senescent skin around the perimeter. He has a little bit of skin breakdown in the distribution of the adhesive drape from the wound VAC. No odor or purulent drainage. No concern for infection. 04/15/2022: 2-week follow-up. He has developed a fairly substantial rash from the drape adhesive for his wound VAC. The periwound has a lot of heaped up epiboly at the margins. The tissue in the wound bed is a little bit pale but there is no odor or purulent drainage. 04/22/2022: Last week we discontinued the VAC. Today, the skin around his wound is in  significantly improved condition. His rash is resolving. He does have some heaped up senescent skin around the wound margin and a layer  of slough on the wound surface, but there is no concern for active infection. 04/29/2022: The wound continues to contract. The surface is clean. He continues to build up senescent skin around the wound edges that subsequently get a little bit macerated. No concern for infection. 05/06/2022: The wound is smaller again today in all dimensions. The surface is clean. He has his usual accumulation of macerated senescent skin around the wound edges. 05/13/2022: Continued wound contracture. The undermining has decreased quite a bit. Clean surface. Senescent skin heaped up around the wound edges, as per [facility], Herbie [place] ([id number]) [phone].pdf Page 3 of 10 usual. 05/22/2022: The macerated senescent skin has accumulated once again. The wound dimensions are slightly smaller and the surface is clean. He has been having difficulty keeping the packing in the wound due to the large foam border dressing that he prefers. 06/03/2022: The wound is smaller and shallower with less undermining. He has built up macerated senescent skin around the margins, as per usual. He and his friend have figured out a way to make sure that his packing stays in the wound with his [place]. 06/12/2022: The wound is a little bit shallower again today. He still has accumulated senescent periwound skin, but otherwise the wound is clean. He will be undergoing bilateral recurrent inguinal hernia repair and umbilical hernia repair next week. 10/20 sacral pressure ulcer. using [facility]stone and backing wet to dry. Has developed a rash sur rounding the wound 07/03/2022: Continued contracture of the wound. It is very clean and there is no odor or purulent drainage. Continued buildup of senescent skin around the margin. 07/13/2022: There is less undermining present today. The orifice continues to  contract. He continues to accumulate senescent skin around the perimeter. The rash around the wound has improved. 07/27/2022: The wound is smaller again today and the undermining is closed then. There is senescent skin accumulation, as usual. 08/07/2022: The wound continues to contract but has its usual accumulation of periwound senescent skin. Unfortunately, the periwound skin looks like it has gotten more moist. It is red and angry and the patient says it is more painful. Electronic Signature(s) Signed: 08/07/2022 9:32:23 AM By: [name] MD FACS Entered By: [name] on 08/07/2022 09:32:22 -------------------------------------------------------------------------------- Physical Exam Details Patient Name: Date of Service: [name], [name] 08/07/2022 9:15 A M Medical Record Number: [id number] Patient Account Number: [id number] Date of Birth/Sex: Treating RN: 1966-03-19 (56 y.o. M) Primary Care Provider: Berniece Pap Other Clinician: Referring Provider: Treating Provider/Extender: Lannie Fields in Treatment: 17 Constitutional He is hypertensive, but asymptomatic.. . . . No acute distress. Respiratory Normal work of breathing on room air. Notes 08/07/2022: The wound continues to contract but has its usual accumulation of periwound senescent skin. Unfortunately, the periwound skin looks like it has gotten more moist. It is red and angry and the patient says it is more painful. Electronic Signature(s) Signed: 08/07/2022 9:33:00 AM By: [name] MD FACS Entered By: [name] on 08/07/2022 09:33:00 -------------------------------------------------------------------------------- Physician Orders Details Patient Name: Date of Service: [name], [name] 08/07/2022 9:15 A M Medical Record Number: [id number] Patient Account Number: [id number] Date of Birth/Sex: Treating RN: Apr 26, 1966 (56 y.o. [name] Primary Care Provider:  Berniece Pap Other Clinician: Referring Provider: Treating Provider/Extender: Lannie Fields in Treatment: 30 Verbal / Phone Orders: No MARINA, DESIRE ([id number]) [id number] Page 4 of 10 Diagnosis Coding ICD-10 Coding Code Description L89.154 Pressure ulcer of sacral region, stage 4 Z89.512 Acquired absence of left leg  below knee Z89.511 Acquired absence of right leg below knee Follow-up Appointments ppointment in 1 week. - Dr. Celine Ahr - room 1 Return A Anesthetic Wound #1 Sacrum (In clinic) Topical Lidocaine 4% applied to wound bed Bathing/ Shower/ Hygiene May shower with protection but do not get wound dressing(s) wet. Off-Loading Gel mattress overlay (Group 1) Turn and reposition every 2 hours Wound Treatment Wound #1 - Sacrum Cleanser: Soap and Water 3 x Per Week/30 Days Discharge Instructions: May shower and wash wound with dial antibacterial soap and water prior to dressing change. Cleanser: Wound Cleanser 3 x Per Week/30 Days Discharge Instructions: Cleanse the wound with wound cleanser prior to applying a clean dressing using gauze sponges, not tissue or cotton balls. Peri-Wound Care: Skin Prep 3 x Per Week/30 Days Discharge Instructions: Use skin prep as directed Peri-Wound Care: Zinc Oxide Ointment 30g tube 3 x Per Week/30 Days Discharge Instructions: Apply Zinc Oxide to macerated periwound with each dressing change as needed Prim Dressing: Sorbalgon AG Dressing 2x2 (in/in) 3 x Per Week/30 Days ary Discharge Instructions: Apply to wound bed as instructed Secondary Dressing: Zetuvit Plus Silicone Border Sacrum Dressing, Sm, 7x7 (in/in) 3 x Per Week/30 Days Discharge Instructions: Apply silicone border over primary dressing as directed. Patient Medications llergies: Bactrim, Ceprotin ([place]), ciprofloxacin, Levaquin A Notifications Medication Indication Start End 08/07/2022 lidocaine DOSE topical 4 % cream -  cream topical Electronic Signature(s) Signed: 08/07/2022 10:59:49 AM By: [name] MD FACS Signed: 08/07/2022 3:37:43 PM By: Adline Peals Entered By: Adline Peals on 08/07/2022 09:37:50 -------------------------------------------------------------------------------- Problem List Details Patient Name: Date of Service: [name], [name] 08/07/2022 9:15 A M Medical Record Number: [id number] Patient Account Number: [id number] Date of Birth/Sex: Treating RN: Jan 20, 1966 (56 y.o. M) Primary Care Provider: Berniece Pap Other Clinician: Referring Provider: Treating Provider/Extender: Lannie Fields in Treatment: [place], [place] ([id number]) [id number] Page 5 of 10 Active Problems ICD-10 Encounter Code Description Active Date MDM Diagnosis L89.154 Pressure ulcer of sacral region, stage 4 01/08/2022 No Yes Z89.512 Acquired absence of left leg below knee 01/08/2022 No Yes Z89.511 Acquired absence of right leg below knee 01/08/2022 No Yes Inactive Problems Resolved Problems Electronic Signature(s) Signed: 08/07/2022 9:30:13 AM By: [name] MD FACS Entered By: [name] on 08/07/2022 09:30:13 -------------------------------------------------------------------------------- Progress Note Details Patient Name: Date of Service: [name], [name] 08/07/2022 9:15 A M Medical Record Number: [id number] Patient Account Number: [id number] Date of Birth/Sex: Treating RN: 04/12/1966 (56 y.o. M) Primary Care Provider: Berniece Pap Other Clinician: Referring Provider: Treating Provider/Extender: Lannie Fields in Treatment: 30 Subjective Chief Complaint Information obtained from Patient Patient is at the clinic for treatment of an open pressure ulcer History of Present Illness (HPI) ADMISSION 01/08/2022 This is a 56 year old male with a past medical history notable for type 2 diabetes mellitus  (last A1c was 8.6) congestive heart failure, hypertension, chronic pain, and bilateral below-knee amputations. His most recent amputation was in November 2022. While he was in the hospital, he developed a sacral pressure ulcer. He was subsequently in a skilled nursing facility for some time. He was discharged with home health and had been in a wound VAC, but was then admitted to the hospital last week when the wound appeared to be worsening. Apparently the periwound skin was macerated and the device that he had been using was leaking quite a bit. Evaluation while in the hospital included a consultation with infectious disease who did not think he had any evidence of  osteomyelitis, plastic surgery who felt that he was not an appropriate flap candidate (their note also states that he is not interested in a flap) and wound care who took him out of the wound VAC and initiated wet-to-dry dressing changes. He has a new wound VAC from [facility] on order, anticipated delivery today. The wound itself is fairly small and isolated to the sacrum. There is muscle exposed. No bone is appreciated but it is palpable beneath the surface. The muscle itself is bit pale and there is heaped up epibole around the wound edges. No significant odor or drainage. 01/14/2022: His wound VAC was not initiated until this past Friday. He has not had any issues with the VAC but today we found that the bridge foam was applied directly to the skin rather than over a layer of adhesive drape. His home health nurse also requested that we consider applying silver collagen to the wound bed in addition to the VAC. There is still a little bit of heaped up senescent skin around the wound periphery. No significant change to the wound dimensions. 01/21/2022: No significant change to the wound dimensions. The senescent skin has not reaccumulated. The periwound skin remains a bit macerated but without any obvious breakdown. The wound surface itself has a  shiny appearance with a little bit of slough accumulation; no true robust granulation tissue at this time. 01/28/2022: The wound is about the same size but a little bit shallower. There is a little bit of senescent skin reaccumulation at the cranial aspect. The periwound skin is red but not macerated and without any tissue breakdown. The wound still does not have the most robust surface. There is a bit of slough accumulation. 02/04/2022: The wound is a bit smaller and the undermining has come in somewhat. There continues to be granulation tissue formation within the wound bed. No significant slough accumulation and his periwound skin is in better condition. 02/18/2022: The wound stinks today. There is no obvious pus but the drainage and wound itself are malodorous. He continues to have heaped up tissue within the wound bed that is rather grayish and not particularly robust-appearing. He is very angry today with his situation. ANKUSH, GINTZ ([id number]) [id number] Page 6 of 10 02/25/2022: Last week, in response to the odor coming from the wound, I took a culture and prescribed topical gentamicin as well as oral cefdinir. Apparently [facility] contacted him about a topical compounded antibiotic, but he did not realize this and he hung up on them. Today, the odor is no longer present. There is some senescent skin around the wound margins as well as continued heaped up granulation tissue in the center of the wound. The undermining continues to contract. 03/04/2022: The wound continues to contract and look better. He still has heaped up hypertrophic granulation tissue near the orifice. No odor was coming from the wound today. He is awaiting [place] delivery. 03/18/2022: 2-week follow-up. [facility] topical antibiotic has been initiated. The chemical cauterization of the hypertrophic granulation tissue was quite successful and the surface is much flatter today. He has heaped up  senescent skin around the perimeter. His home health providers have figured out a way to keep the wound VAC from losing suction by bolstering with DuoDERM. Overall there has been significant improvement since our last visit. 04/01/2022: 2-week follow-up. The wound continues to contract. Once again, there is heaped up senescent skin around the perimeter. He has a little bit of skin breakdown in the distribution of the adhesive drape from the  wound VAC. No odor or purulent drainage. No concern for infection. 04/15/2022: 2-week follow-up. He has developed a fairly substantial rash from the drape adhesive for his wound VAC. The periwound has a lot of heaped up epiboly at the margins. The tissue in the wound bed is a little bit pale but there is no odor or purulent drainage. 04/22/2022: Last week we discontinued the VAC. Today, the skin around his wound is in significantly improved condition. His rash is resolving. He does have some heaped up senescent skin around the wound margin and a layer of slough on the wound surface, but there is no concern for active infection. 04/29/2022: The wound continues to contract. The surface is clean. He continues to build up senescent skin around the wound edges that subsequently get a little bit macerated. No concern for infection. 05/06/2022: The wound is smaller again today in all dimensions. The surface is clean. He has his usual accumulation of macerated senescent skin around the wound edges. 05/13/2022: Continued wound contracture. The undermining has decreased quite a bit. Clean surface. Senescent skin heaped up around the wound edges, as per usual. 05/22/2022: The macerated senescent skin has accumulated once again. The wound dimensions are slightly smaller and the surface is clean. He has been having difficulty keeping the packing in the wound due to the large foam border dressing that he prefers. 06/03/2022: The wound is smaller and shallower with less undermining. He  has built up macerated senescent skin around the margins, as per usual. He and his friend have figured out a way to make sure that his packing stays in the wound with his [place]. 06/12/2022: The wound is a little bit shallower again today. He still has accumulated senescent periwound skin, but otherwise the wound is clean. He will be undergoing bilateral recurrent inguinal hernia repair and umbilical hernia repair next week. 10/20 sacral pressure ulcer. using [facility] and backing wet to dry. Has developed a rash sur rounding the wound 07/03/2022: Continued contracture of the wound. It is very clean and there is no odor or purulent drainage. Continued buildup of senescent skin around the margin. 07/13/2022: There is less undermining present today. The orifice continues to contract. He continues to accumulate senescent skin around the perimeter. The rash around the wound has improved. 07/27/2022: The wound is smaller again today and the undermining is closed then. There is senescent skin accumulation, as usual. 08/07/2022: The wound continues to contract but has its usual accumulation of periwound senescent skin. Unfortunately, the periwound skin looks like it has gotten more moist. It is red and angry and the patient says it is more painful. Patient History Information obtained from Patient, Caregiver, Chart. Family History Cancer - Father, Heart Disease - Father,Paternal Grandparents, Hypertension - Father, No family history of Diabetes, Hereditary Spherocytosis, Kidney Disease, Lung Disease, Seizures, Stroke, Thyroid Problems, Tuberculosis. Social History Never smoker, Marital Status - Divorced, Alcohol Use - Never, Drug Use - No History, Caffeine Use - Moderate - coffee. Medical History Eyes Denies history of Cataracts, Glaucoma, Optic Neuritis Ear/Nose/Mouth/Throat Denies history of Chronic sinus problems/congestion, Middle ear problems Cardiovascular Patient has history of Congestive  Heart Failure, Hypertension Endocrine Patient has history of Type II Diabetes Denies history of Type I Diabetes Genitourinary Denies history of End Stage Renal Disease Integumentary (Skin) Denies history of History of Burn Musculoskeletal Patient has history of Osteomyelitis - S1 and coccyx Oncologic Denies history of Received Chemotherapy, Received Radiation Psychiatric Patient has history of Confinement Anxiety Denies history of Anorexia/bulimia  Hospitalization/Surgery History - bil BKA. Medical A Surgical History Notes nd Respiratory pulmonary nodules Genitourinary SAVIAN, MAZON ([id number]) [id number] Page 7 of 10 renal mass, urinary hesitancy Musculoskeletal discitis of thoracic region Objective Constitutional He is hypertensive, but asymptomatic.Marland Kitchen No acute distress. Vitals Time Taken: 9:11 AM, Temperature: 97.8 F, Pulse: 95 bpm, Respiratory Rate: 16 breaths/min, Blood Pressure: 172/98 mmHg. Respiratory Normal work of breathing on room air. General Notes: 08/07/2022: The wound continues to contract but has its usual accumulation of periwound senescent skin. Unfortunately, the periwound skin looks like it has gotten more moist. It is red and angry and the patient says it is more painful. Integumentary (Hair, Skin) Wound #1 status is Open. Original cause of wound was Pressure Injury. The date acquired was: 07/29/2021. The wound has been in treatment 30 weeks. The wound is located on the Sacrum. The wound measures 1cm length x 1cm width x 0.8cm depth; 0.785cm^2 area and 0.628cm^3 volume. There is Fat Layer (Subcutaneous Tissue) exposed. There is no tunneling noted, however, there is undermining starting at 12:00 and ending at 6:00 with a maximum distance of 0.8cm. There is a medium amount of serosanguineous drainage noted. The wound margin is epibole. There is large (67-100%) pink, pale granulation within the wound bed. There is a small (1-33%)  amount of necrotic tissue within the wound bed including Adherent Slough. The periwound skin appearance had no abnormalities noted for color. The periwound skin appearance exhibited: Callus, Excoriation, Rash, Maceration. The periwound skin appearance did not exhibit: Dry/Scaly. Periwound temperature was noted as No Abnormality. 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 There was a Selective/Open Wound Skin/Epidermis Debridement with a total area of 1 sq cm performed by [name], MD. With the following instrument(s): Curette to remove Non-Viable tissue/material. Material removed includes Skin: Epidermis after achieving pain control using Lidocaine 4% Topical Solution. No specimens were taken. A time out was conducted at 09:20, prior to the start of the procedure. A Minimum amount of bleeding was controlled with Pressure. The procedure was tolerated well. Post Debridement Measurements: 1cm length x 1cm width x 0.8cm depth; 0.628cm^3 volume. Post debridement Stage noted as Category/Stage IV. Character of Wound/Ulcer Post Debridement is improved. Post procedure Diagnosis Wound #1: Same as Pre-Procedure General Notes: scribed for Dr. Celine Ahr by Adline Peals, RN. Plan Follow-up Appointments: Return Appointment in 1 week. - Dr. Celine Ahr - room 1 Anesthetic: Wound #1 Sacrum: (In clinic) Topical Lidocaine 4% applied to wound bed Bathing/ Shower/ Hygiene: May shower with protection but do not get wound dressing(s) wet. Off-Loading: Gel mattress overlay (Group 1) Turn and reposition every 2 hours The following medication(s) was prescribed: lidocaine topical 4 % cream cream topical was prescribed at facility WOUND #1: - Sacrum Wound Laterality: Cleanser: Soap and Water 3 x  Per Week/30 Days Discharge Instructions: May shower and wash wound with dial antibacterial soap and water prior to dressing change. SHARIQ, PUIG ([id number]) [id number] Page 8 of 10 Cleanser: Wound Cleanser 3 x Per Week/30 Days Discharge Instructions: Cleanse the wound with wound cleanser prior to applying a clean dressing using gauze sponges, not tissue or cotton balls. Peri-Wound Care: Skin Prep 3 x Per Week/30 Days Discharge Instructions: Use skin prep as directed Peri-Wound Care: Zinc Oxide Ointment 30g tube 3 x Per Week/30 Days Discharge Instructions: Apply Zinc  Oxide to macerated periwound with each dressing change as needed Prim Dressing: Sorbalgon AG Dressing 2x2 (in/in) 3 x Per Week/30 Days ary Discharge Instructions: Apply to wound bed as instructed Secondary Dressing: Zetuvit Plus Silicone Border Sacrum Dressing, Sm, 7x7 (in/in) 3 x Per Week/30 Days Discharge Instructions: Apply silicone border over primary dressing as directed. 08/07/2022: The wound continues to contract but has its usual accumulation of periwound senescent skin. Unfortunately, the periwound skin looks like it has gotten more moist. It is red and angry and the patient says it is more painful. I used a curette to debride the periwound senescent skin. I am going to change his dressing from Dakin's packing to packing the wound with silver alginate to try and absorb more moisture. I am also going to have him air out his buttocks for an hour a day to try and dry the skin and then apply Desitin, essentially treating him as though he had diaper rash. Follow-up in 1 week. Electronic Signature(s) Signed: 08/07/2022 10:59:49 AM By: [name] MD FACS Signed: 08/07/2022 3:37:43 PM By: Adline Peals Previous Signature: 08/07/2022 9:34:15 AM Version By: [name] MD FACS Entered By: Adline Peals on 08/07/2022  09:38:21 -------------------------------------------------------------------------------- HxROS Details Patient Name: Date of Service: [name], [name] 08/07/2022 9:15 A M Medical Record Number: [id number] Patient Account Number: [id number] Date of Birth/Sex: Treating RN: 11/25/1965 (56 y.o. M) Primary Care Provider: Berniece Pap Other Clinician: Referring Provider: Treating Provider/Extender: Lannie Fields in Treatment: 30 Information Obtained From Patient Caregiver Chart Eyes Medical History: Negative for: Cataracts; Glaucoma; Optic Neuritis Ear/Nose/Mouth/Throat Medical History: Negative for: Chronic sinus problems/congestion; Middle ear problems Respiratory Medical History: Past Medical History Notes: pulmonary nodules Cardiovascular Medical History: Positive for: Congestive Heart Failure; Hypertension Endocrine Medical History: Positive for: Type II Diabetes Negative for: Type I Diabetes Time with diabetes: since 2004 Treated with: Insulin Blood sugar tested every day: Yes Tested : 4 times per day DHYAN, NOAH ([id number]) [id number] Page 9 of 10 Genitourinary Medical History: Negative for: End Stage Renal Disease Past Medical History Notes: renal mass, urinary hesitancy Integumentary (Skin) Medical History: Negative for: History of Burn Musculoskeletal Medical History: Positive for: Osteomyelitis - S1 and coccyx Past Medical History Notes: discitis of thoracic region Oncologic Medical History: Negative for: Received Chemotherapy; Received Radiation Psychiatric Medical History: Positive for: Confinement Anxiety Negative for: Anorexia/bulimia Immunizations Pneumococcal Vaccine: Received Pneumococcal Vaccination: No Implantable Devices No devices added Hospitalization / Surgery History Type of Hospitalization/Surgery bil BKA Family and Social History Cancer: Yes - Father; Diabetes: No; Heart  Disease: Yes - Father,Paternal Grandparents; Hereditary Spherocytosis: No; Hypertension: Yes - Father; Kidney Disease: No; Lung Disease: No; Seizures: No; Stroke: No; Thyroid Problems: No; Tuberculosis: No; Never smoker; Marital Status - Divorced; Alcohol Use: Never; Drug Use: No History; Caffeine Use: Moderate - coffee; Financial Concerns: No; Food, Clothing or Shelter Needs: No; Support System Lacking: No; Transportation Concerns: Yes - hurts to transfer Electronic Signature(s) Signed: 08/07/2022 10:59:49 AM By: [name] MD FACS Entered By: [name] on 08/07/2022 09:32:39 -------------------------------------------------------------------------------- SuperBill Details Patient Name: Date of Service: [name], [place] 08/07/2022 Medical Record Number: [id number] Patient Account Number: [id number] Date of Birth/Sex: Treating RN: [birth year] (56 y.o. M) Primary Care Provider: Berniece Pap Other Clinician: Referring Provider: Treating Provider/Extender: Lannie Fields in Treatment: 30 Diagnosis Coding ICD-10 Codes Code Description L89.154 Pressure ulcer of sacral region, stage 4 Z89.512 Acquired absence of left leg below knee CHEVEZ, SAMBRANO ([id number]) [id number] Page 10 of 10  Z89.511 Acquired absence of right leg below knee Facility Procedures : CPT4 Code: [id number] Description: 7597 - DEBRIDE WOUND 1ST 20 SQ CM OR < ICD-10 Diagnosis Description L89.154 Pressure ulcer of sacral region, stage 4 Modifier: Quantity: 1 Physician Procedures : CPT4 Code Description Modifier [id number] - WC PHYS LEVEL 4 - EST PT 25 ICD-10 Diagnosis Description L89.154 Pressure ulcer of sacral region, stage 4 Z89.512 Acquired absence of left leg below knee Z89.511 Acquired absence of right leg below knee Quantity: 1 : [id number] - WC PHYS DEBR WO ANESTH 20 SQ CM ICD-10 Diagnosis Description L89.154 Pressure ulcer of sacral region,  stage 4 Quantity: 1 Electronic Signature(s) Signed: 08/07/2022 9:36:56 AM By: [name] MD FACS Entered By: [name] on 08/07/2022 09:36:55

## 2022-08-07 NOTE — Progress Notes (Signed)
[name] ([id number]) [id number] Page 1 of 8 Visit Report for 08/07/2022 Arrival Information Details Patient Name: Date of Service: [name], [name] 08/07/2022 9:15 A M Medical Record Number: [id number] Patient Account Number: [id number] Date of Birth/Sex: Treating RN: 27-Mar-1966 (56 y.o. [name] Primary Care [name]: [name] Other Clinician: Referring Tanasha Menees: Treating [name]/Extender: [name] in Treatment: 89 Visit Information History Since Last Visit Added or deleted any medications: No Patient Arrived: [name] Any new allergies or adverse reactions: No Arrival Time: 09:08 Had a fall or experienced change in No Accompanied By: wife activities of daily living that may affect Transfer Assistance: None risk of falls: Patient Identification Verified: Yes Signs or symptoms of abuse/neglect since last visito No Secondary Verification Process Completed: Yes Hospitalized since last visit: No Patient Requires Transmission-Based Precautions: No Implantable device outside of the clinic excluding No Patient Has Alerts: No cellular tissue based products placed in the center since last visit: Has Dressing in Place as Prescribed: Yes Pain Present Now: Yes Electronic Signature(s) Signed: 08/07/2022 3:37:43 PM By: [name] Entered By: [name] on 08/07/2022 09:10:14 -------------------------------------------------------------------------------- Encounter Discharge Information Details Patient Name: Date of Service: [name], [name] 08/07/2022 9:15 A M Medical Record Number: [id number] Patient Account Number: [id number] Date of Birth/Sex: Treating RN: 1966-08-27 (56 y.o. [name] Primary Care [name]: [name] Other Clinician: Referring Calyb Mcquarrie: Treating [name]/Extender: [name] in Treatment: 30 Encounter Discharge Information Items  Post Procedure Vitals Discharge Condition: Stable Temperature (F): 97.8 Ambulatory Status: Walker Pulse (bpm): 95 Discharge Destination: Home Respiratory Rate (breaths/min): 16 Transportation: Private Auto Blood Pressure (mmHg): 172/98 Accompanied By: self Schedule Follow-up Appointment: Yes Clinical Summary of Care: Patient Declined Electronic Signature(s) Signed: 08/07/2022 3:37:43 PM By: [name] Entered By: [name] on 08/07/2022 09:42:56 [name] ([id number]) [id number] Page 2 of 8 -------------------------------------------------------------------------------- Lower Extremity Assessment Details Patient Name: Date of Service: [name], [name] 08/07/2022 9:15 A M Medical Record Number: [id number] Patient Account Number: [id number] Date of Birth/Sex: Treating RN: 1966-06-09 (56 y.o. [name] Primary Care [name]: [name] Other Clinician: Referring [name]: Treating [name]/Extender: [name] in Treatment: 30 Electronic Signature(s) Signed: 08/07/2022 3:37:43 PM By: [name] By: [name] on 08/07/2022 09:10:42 -------------------------------------------------------------------------------- Multi Wound Chart Details Patient Name: Date of Service: [name], [name] 08/07/2022 9:15 A M Medical Record Number: [id number] Patient Account Number: [id number] Date of Birth/Sex: Treating RN: [birth year] (56 y.o. M) Primary Care [name]: [name] Other Clinician: Referring [name]: Treating Alamin Mccuiston/Extender: [name] in Treatment: 30 Vital Signs Height(in): Pulse(bpm): 95 Weight(lbs): Blood Pressure(mmHg): 172/98 Body Mass Index(BMI): Temperature(F): 97.8 Respiratory Rate(breaths/min): 16 [1:Photos:] [N/A:N/A] Sacrum N/A N/A Wound Location: Pressure Injury N/A N/A Wounding Event: Pressure Ulcer N/A  N/A Primary Etiology: Congestive Heart Failure, N/A N/A Comorbid History: Hypertension, Type II Diabetes, Osteomyelitis, Confinement Anxiety 07/29/2021 N/A N/A Date Acquired: 30 N/A N/A Weeks of Treatment: Open N/A N/A Wound Status: No N/A N/A Wound Recurrence: 1x1x0.8 N/A N/A Measurements L x W x D (cm) 0.785 N/A N/A A (cm) : rea 0.628 N/A N/A Volume (cm) : 86.70% N/A N/A % Reduction in A rea: 90.30% N/A N/A % Reduction in Volume: 12 Starting Position 1 (o'clock): 6 Ending Position 1 (o'clock): 0.8 Maximum Distance 1 (cm): Yes N/A N/A Undermining: Category/Stage IV N/A N/A Classification: Medium N/A N/A Exudate A mount: Serosanguineous N/A N/A Exudate TypeKAMERIN, [name] ([id number]) [id number] Page 3 of 8 red,  brown N/A N/A Exudate Color: Epibole N/A N/A Wound Margin: Large (67-100%) N/A N/A Granulation Amount: Pink, Pale N/A N/A Granulation Quality: Small (1-33%) N/A N/A Necrotic Amount: Fat Layer (Subcutaneous Tissue): Yes N/A N/A Exposed Structures: Fascia: No Tendon: No Muscle: No Joint: No Bone: No Small (1-33%) N/A N/A Epithelialization: Debridement - Selective/Open Wound N/A N/A Debridement: Pre-procedure Verification/Time Out 09:20 N/A N/A Taken: Lidocaine 4% Topical Solution N/A N/A Pain Control: Skin/Epidermis N/A N/A Level: 1 N/A N/A Debridement A (sq cm): rea Curette N/A N/A Instrument: Minimum N/A N/A Bleeding: Pressure N/A N/A Hemostasis A chieved: Procedure was tolerated well N/A N/A Debridement Treatment Response: 1x1x0.8 N/A N/A Post Debridement Measurements L x W x D (cm) 0.628 N/A N/A Post Debridement Volume: (cm) Category/Stage IV N/A N/A Post Debridement Stage: Excoriation: Yes N/A N/A Periwound Skin Texture: Callus: Yes Rash: Yes Maceration: Yes N/A N/A Periwound Skin Moisture: Dry/Scaly: No No Abnormalities Noted N/A N/A Periwound Skin Color: No Abnormality N/A  N/A Temperature: Debridement N/A N/A Procedures Performed: Treatment Notes Wound #1 (Sacrum) Cleanser Soap and Water Discharge Instruction: May shower and wash wound with dial antibacterial soap and water prior to dressing change. Wound Cleanser Discharge Instruction: Cleanse the wound with wound cleanser prior to applying a clean dressing using gauze sponges, not tissue or cotton balls. Peri-Wound Care Zinc Oxide Ointment 30g tube Discharge Instruction: Apply Zinc Oxide to macerated periwound with each dressing change as needed Topical Primary Dressing Sorbalgon AG Dressing 2x2 (in/in) Discharge Instruction: Apply to wound bed as instructed Medline Woven Gauze Sponges 4x4 (in/in) Discharge Instruction: pack into wound, moisten with saline and Keystone compound, smaller dressing over wound to hold packing in wouind Secondary Dressing MPM Excel SAP Bordered Dressing, 7x6.7 (Sacral) (in/in) Discharge Instruction: Apply silicone border over primary dressing as directed. Secured With Compression Wrap Compression Stockings Environmental education officer) Signed: 08/07/2022 9:30:20 AM By: [name] MD FACS Entered By: [name] on 08/07/2022 09:30:19 [name] ([id number]) [id number] Page 4 of 8 -------------------------------------------------------------------------------- Multi-Disciplinary Care Plan Details Patient Name: Date of Service: [place], [name] 08/07/2022 9:15 A M Medical Record Number: [id number] Patient Account Number: [id number] Date of Birth/Sex: Treating RN: Feb 17, 1966 (56 y.o. [name] Primary Care Joia Doyle: [name] Other Clinician: Referring Jazzma Neidhardt: Treating Shay Jhaveri/Extender: [name] in Treatment: [place] reviewed with physician Active Inactive Nutrition Nursing Diagnoses: Impaired glucose control: actual or potential Potential for  alteratiion in Nutrition/Potential for imbalanced nutrition Goals: Patient/caregiver will maintain therapeutic glucose control Date Initiated: 01/08/2022 Target Resolution Date: 09/04/2022 Goal Status: Active Interventions: Assess HgA1c results as ordered upon admission and as needed Assess patient nutrition upon admission and as needed per policy Provide education on elevated blood sugars and impact on wound healing Treatment Activities: Dietary management education, guidance and counseling : 01/08/2022 Patient referred to Primary Care Physician for further nutritional evaluation : 01/08/2022 Notes: Pressure Nursing Diagnoses: Knowledge deficit related to causes and risk factors for pressure ulcer development Knowledge deficit related to management of pressures ulcers Potential for impaired tissue integrity related to pressure, friction, moisture, and shear Goals: Patient/caregiver will verbalize understanding of pressure ulcer management Date Initiated: 01/08/2022 Target Resolution Date: 09/04/2022 Goal Status: Active Interventions: Assess: immobility, friction, shearing, incontinence upon admission and as needed Assess offloading mechanisms upon admission and as needed Assess potential for pressure ulcer upon admission and as needed Notes: Wound/Skin Impairment Nursing Diagnoses: Impaired tissue integrity Knowledge deficit related to ulceration/compromised skin integrity Goals: Patient/caregiver will verbalize understanding of skin care  regimen Date Initiated: 01/08/2022 Target Resolution Date: 09/04/2022 Goal Status: Active Ulcer/skin breakdown will have a volume reduction of 30% by week 4 Date Initiated: 01/08/2022 Date Inactivated: 02/18/2022 Target Resolution Date: 02/05/2022 Goal Status: Unmet Unmet Reason: VAC leaking Ulcer/skin breakdown will have a volume reduction of 50% by week 8 Date Initiated: 02/18/2022 Date Inactivated: 03/04/2022 Target Resolution Date:  03/05/2022 SACRAMENTO, MONDS ([id number]) [id number] Page 5 of 8 Goal Status: Unmet Unmet Reason: infection Ulcer/skin breakdown will have a volume reduction of 80% by week 12 Date Initiated: 03/04/2022 Date Inactivated: 04/01/2022 Target Resolution Date: 04/02/2022 Goal Status: Unmet Unmet Reason: too much moisture Interventions: Assess patient/caregiver ability to obtain necessary supplies Assess patient/caregiver ability to perform ulcer/skin care regimen upon admission and as needed Assess ulceration(s) every visit Provide education on ulcer and skin care Treatment Activities: Skin care regimen initiated : 01/08/2022 Topical wound management initiated : 01/08/2022 Notes: Electronic Signature(s) Signed: 08/07/2022 3:37:43 PM By: [name] Entered By: [name] on 08/07/2022 09:11:03 -------------------------------------------------------------------------------- Pain Assessment Details Patient Name: Date of Service: [name], [name] 08/07/2022 9:15 A M Medical Record Number: [id number] Patient Account Number: [id number] Date of Birth/Sex: Treating RN: [birth year] (56 y.o. [name] Primary Care Jerrell Mangel: [name] Other Clinician: Referring Vanesha Athens: Treating Tyreon Frigon/Extender: [name] in Treatment: 30 Active Problems Location of Pain Severity and Description of Pain Patient Has Paino Yes Site Locations Pain Location: Generalized Pain, Pain in Ulcers Duration of the Pain. Constant / Intermittento Constant Rate the pain. Current Pain Level: 9 Pain Management and Medication Current Pain Management: Medication: Yes Electronic Signature(s) Signed: 08/07/2022 3:37:43 PM By: [name] Entered By: [name] on 08/07/2022 09:10:39 [name] ([id number]) [id number] Page 6 of  8 -------------------------------------------------------------------------------- Patient/Caregiver Education Details Patient Name: Date of Service: [place], [name] 12/1/2023andnbsp9:15 A M Medical Record Number: [id number] Patient Account Number: [id number] Date of Birth/Gender: Treating RN: 03-05-1966 (56 y.o. [name] Primary Care Physician: [name] Other Clinician: Referring Physician: Treating Physician/Extender: [name] in Treatment: 30 Education Assessment Education Provided To: Patient Education Topics Provided Wound/Skin Impairment: Methods: Explain/Verbal Responses: Reinforcements needed, State content correctly Electronic Signature(s) Signed: 08/07/2022 3:37:43 PM By: [name] Entered By: [name] on 08/07/2022 09:11:21 -------------------------------------------------------------------------------- Wound Assessment Details Patient Name: Date of Service: [name], [name] 08/07/2022 9:15 A M Medical Record Number: [id number] Patient Account Number: [id number] Date of Birth/Sex: Treating RN: 04-04-1966 (56 y.o. [name] Primary Care Samreen Seltzer: [name] Other Clinician: Referring Ante Arredondo: Treating Sabel Hornbeck/Extender: [name] in Treatment: 30 Wound Status Wound Number: 1 Primary Pressure Ulcer Etiology: Wound Location: Sacrum Wound Open Wounding Event: Pressure Injury Status: Date Acquired: 07/29/2021 Comorbid Congestive Heart Failure, Hypertension, Type II Diabetes, Weeks Of Treatment: 30 History: Osteomyelitis, Confinement Anxiety Clustered Wound: No Photos Wound Measurements Length: (cm) Width: (cm) Marasco, Jermon ([id number]) Depth: (cm) Area: (cm) Volume: (cm) 1 % Reduction in Area: 86.7% 1 % Reduction in Volume: 90.3% [id number] Page 7 of 8 0.8 Epithelialization: Small (1-33%) 0.785 Tunneling: No 0.628  Undermining: Yes Starting Position (o'clock): 12 Ending Position (o'clock): 6 Maximum Distance: (cm) 0.8 Wound Description Classification: Category/Stage IV Wound Margin: Epibole Exudate Amount: Medium Exudate Type: Serosanguineous Exudate Color: red, brown Foul Odor After Cleansing: No Slough/Fibrino Yes Wound Bed Granulation Amount: Large (67-100%) Exposed Structure Granulation Quality: Pink, Pale Fascia Exposed: No Necrotic Amount: Small (1-33%) Fat Layer (Subcutaneous Tissue) Exposed: Yes Necrotic Quality: Adherent Slough Tendon Exposed: No Muscle Exposed: No Joint Exposed:  No Bone Exposed: No Periwound Skin Texture Texture Color No Abnormalities Noted: No No Abnormalities Noted: Yes Callus: Yes Temperature / Pain Excoriation: Yes Temperature: No Abnormality Rash: Yes Moisture No Abnormalities Noted: No Dry / Scaly: No Maceration: Yes Treatment Notes Wound #1 (Sacrum) Cleanser Soap and Water Discharge Instruction: May shower and wash wound with dial antibacterial soap and water prior to dressing change. Wound Cleanser Discharge Instruction: Cleanse the wound with wound cleanser prior to applying a clean dressing using gauze sponges, not tissue or cotton balls. Peri-Wound Care Zinc Oxide Ointment 30g tube Discharge Instruction: Apply Zinc Oxide to macerated periwound with each dressing change as needed Topical Primary Dressing Sorbalgon AG Dressing 2x2 (in/in) Discharge Instruction: Apply to wound bed as instructed Medline Woven Gauze Sponges 4x4 (in/in) Discharge Instruction: pack into wound, moisten with saline and Keystone compound, smaller dressing over wound to hold packing in wouind Secondary Dressing MPM Excel SAP Bordered Dressing, 7x6.7 (Sacral) (in/in) Discharge Instruction: Apply silicone border over primary dressing as directed. Secured With Compression Wrap Compression Stockings Add-Ons Electronic Signature(s) Signed: 08/07/2022 3:37:43 PM By:  [name] Entered By: [name] on 08/07/2022 09:16:46 [name] ([id number]) [id number] Page 8 of 8 -------------------------------------------------------------------------------- Vitals Details Patient Name: Date of Service: [name], [name] 08/07/2022 9:15 A M Medical Record Number: [id number] Patient Account Number: [id number] Date of Birth/Sex: Treating RN: 1966-01-06 (56 y.o. [name] Primary Care Cameron Katayama: [name] Other Clinician: Referring Luccia Reinheimer: Treating Patryk Conant/Extender: [name] in Treatment: 30 Vital Signs Time Taken: 09:11 Temperature (F): 97.8 Pulse (bpm): 95 Respiratory Rate (breaths/min): 16 Blood Pressure (mmHg): 172/98 Reference Range: 80 - 120 mg / dl Electronic Signature(s) Signed: 08/07/2022 3:37:43 PM By: [name] Entered By: [name] on 08/07/2022 09:11:53

## 2022-08-10 ENCOUNTER — Ambulatory Visit (HOSPITAL_BASED_OUTPATIENT_CLINIC_OR_DEPARTMENT_OTHER): Payer: Medicare HMO | Admitting: General Surgery

## 2022-08-10 ENCOUNTER — Encounter: Payer: Self-pay | Admitting: Physical Therapy

## 2022-08-10 ENCOUNTER — Ambulatory Visit (INDEPENDENT_AMBULATORY_CARE_PROVIDER_SITE_OTHER): Payer: Medicare HMO | Admitting: Physical Therapy

## 2022-08-10 DIAGNOSIS — M6281 Muscle weakness (generalized): Secondary | ICD-10-CM

## 2022-08-10 DIAGNOSIS — R2681 Unsteadiness on feet: Secondary | ICD-10-CM

## 2022-08-10 DIAGNOSIS — Z7409 Other reduced mobility: Secondary | ICD-10-CM | POA: Diagnosis not present

## 2022-08-10 DIAGNOSIS — M5459 Other low back pain: Secondary | ICD-10-CM | POA: Diagnosis not present

## 2022-08-10 DIAGNOSIS — R2689 Other abnormalities of gait and mobility: Secondary | ICD-10-CM | POA: Diagnosis not present

## 2022-08-10 NOTE — Therapy (Signed)
OUTPATIENT PHYSICAL THERAPY TREATMENT NOTE & PROGRESS NOTE   Patient Name: [name] MRN: [id number] DOB:[birth year], 56 y.o., male Today's Date: 08/10/2022  PCP: Loralee Pacas, MD REFERRING PROVIDER: Loralee Pacas, MD  Progress Note Reporting Period 07/06/2022 to 08/10/2022  See note below for Objective Data and Assessment of Progress/Goals.      END OF SESSION:   PT End of Session - 08/10/22 1300     Visit Number 10    Number of Visits 26    Date for PT Re-Evaluation 10/01/22    Authorization Type [employer]    Authorization Time Period DED MET AND OOP MET Approved  Authorization #[id number]  Tracking #[id number] pt visits 10/31-1/25/24    Authorization - Number of Visits 12    Progress Note Due on Visit 10    PT Start Time 1300    PT Stop Time 1344    PT Time Calculation (min) 44 min    Equipment Utilized During Treatment Gait belt    Activity Tolerance Patient tolerated treatment well;Patient limited by pain    Behavior During Therapy WFL for tasks assessed/performed                 Past Medical History:  Diagnosis Date   Acquired complex renal cyst 09/25/2019   AKI (acute kidney injury) ([place]) 07/22/2021   Cancer ([place])    renal   Chest pain 84/13/2440   Complication of anesthesia    Decubitus ulcer of sacral area 12/15/2021   Depression    Diabetes mellitus without complication ([place])    Diabetic infection of left foot ([place]) 04/17/2021   Episode of unresponsiveness 12/15/2021   Gas gangrene of foot ([name]) 07/22/2021   Healthcare maintenance 09/25/2019   Hematuria 09/25/2019   History of kidney stones    Lactic acidosis 12/15/2021   Leukocytosis 09/13/2021   Osteomyelitis ([place])    Sacral pressure injury of skin 07/27/2021   Septic shock ([place]) 07/22/2021   Severe sepsis with acute organ dysfunction ([place]) 07/22/2021   Shortness of breath 05/16/2021   Stage 3a chronic kidney disease ([place]) 07/04/2535   Systolic dysfunction    Type 2 diabetes  mellitus 10/12/2019   Past Surgical History:  Procedure Laterality Date   AMPUTATION Left 07/23/2021   Procedure: LEFT BELOW KNEE AMPUTATION;  Surgeon: [name], MD;  Location: [place];  Service: Orthopedics;  Laterality: Left;   BELOW KNEE LEG AMPUTATION Right    BUBBLE STUDY  07/29/2021   Procedure: BUBBLE STUDY;  Surgeon: Sueanne Margarita, MD;  Location: [place];  Service: Cardiovascular;;   CARDIOVERSION N/A 07/29/2021   Procedure: CARDIOVERSION;  Surgeon: Sueanne Margarita, MD;  [facility]: [facility] ENDOSCOPY;  Service: Cardiovascular;  Laterality: N/A;   RADIOLOGY WITH ANESTHESIA N/A 08/05/2021   Procedure: MRI LUMBAR WITH AND WITHOUT; THORASIC SPINE WITH AND WITHOUT WITH ANESTHESIA;  Surgeon: Radiologist, Medication, MD;  Location: [place];  Service: Radiology;  Laterality: N/A;   RADIOLOGY WITH ANESTHESIA N/A 08/07/2021   Procedure: MRI WITH LUMBER WITH AND WITHOUT CONTRAST,THORACIC WITH AND WITHOUT CONTRAST;  Surgeon: Radiologist, Medication, MD;  Location: [place];  Service: Radiology;  Laterality: N/A;   RADIOLOGY WITH ANESTHESIA N/A 09/13/2021   Procedure: MRI WITH ANESTHESIA;  Surgeon: Luanne Bras, MD;  Location: [place];  Service: Radiology;  Laterality: N/A;   TEE WITHOUT CARDIOVERSION N/A 07/29/2021   Procedure: TRANSESOPHAGEAL ECHOCARDIOGRAM (TEE);  Surgeon: Sueanne Margarita, MD;  Location: [place];  Service: Cardiovascular;  Laterality: N/A;  Patient Active Problem List   Diagnosis Date Noted   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[place]) 07/24/2022   Type 1 diabetes mellitus with complications ([place]) 64/68/0321   Malaise 06/22/2022   Polycythemia 06/22/2022   Amputation below knee ([place]) 04/23/2022   PTSD (post-traumatic stress disorder) 04/23/2022   Generalized anxiety disorder 01/21/2022   Episode of unresponsiveness 12/15/2021   Acute respiratory failure with hypoxia (HCC) secondary to suspected aspiration pneumonia 12/15/2021   Renal mass 12/15/2021   Stage IV pressure ulcer  of sacral region ([place]) 09/15/2021   Insomnia 22/48/2500   Chronic systolic CHF (congestive heart failure) ([place]) 09/14/2021   Hypogonadism in male 09/14/2021   Chronic pain 09/14/2021   Pseudohyponatremia 09/13/2021   Normocytic anemia 09/13/2021   Discitis of thoracic region    Paroxysmal atrial flutter ([place]) 07/22/2021   Chronic diastolic (congestive) heart failure ([place]) 05/16/2021   Lumbar spondylosis 03/25/2021   H/O amputation of leg through tibia and fibula ([place]) 11/10/2019   Degeneration of lumbar intervertebral disc 11/10/2019   Hypercholesterolemia 11/10/2019   Left below-knee amputee ([place]) 10/30/2019   Dyslipidemia 10/30/2019   Type 1 diabetes mellitus with diabetic polyneuropathy, with long-term current use of insulin ([place]) 10/12/2019   Right below-elbow amputee 10/12/2019   Hypertensive disorder 09/25/2019   Urinary hesitancy 09/25/2019   Prosthesis adjustments 09/25/2019   Erectile dysfunction 09/25/2019   Palliative care status 09/25/2019   Pulmonary nodules 09/25/2019   Depressive disorder 09/25/2019    REFERRING DIAG: G89.4 (ICD-10-CM) - Chronic pain syndrome   Z89.512 Left Below Knee Amputation, Z89.511 history of right Below Knee Amputation.  THERAPY DIAG:  Unsteadiness on feet  Other abnormalities of gait and mobility  Muscle weakness (generalized)  Impaired functional mobility and activity tolerance  Other low back pain  Rationale for Evaluation and Treatment Rehabilitation  PERTINENT HISTORY:  DM2, CHF, HTN, chronic pain, bilateral TTAs  PRECAUTIONS: Fall  SUBJECTIVE:                                                                                                                                                                                      SUBJECTIVE STATEMENT:  He saw Dr about wound who wants him off it, not sitting too much.The redness was "diaper rash."  Ramos increased frequency of pain meds from 3x/day to 5x/day.     PAIN:  Are you  having pain? Are you having pain? Yes: NPRS scale: today 7/10 and in last week ranging 6-10/10 Pain location: middle low back Pain description: sharp Aggravating factors: extension Relieving factors: sitting forward,   OBJECTIVE: (objective measures completed at initial evaluation unless otherwise dated)   OBJECTIVE:  COGNITION: Overall cognitive status: Within  functional limits for tasks assessed   POSTURE: rounded shoulders, forward [name], flexed trunk , and weight shift left   LOWER EXTREMITY ROM:   ROM P:passive  A:active Right eval Left eval  Hip flexion      Hip extension Standing A: -15* Standing A: -15*  Hip abduction      Hip adduction      Hip internal rotation      Hip external rotation      Knee flexion      Knee extension      Ankle dorsiflexion      Ankle plantarflexion      Ankle inversion      Ankle eversion       (Blank rows = not tested)   LOWER EXTREMITY MMT:   MMT Right eval Left eval  Hip flexion 4/5 4/5  Hip extension 3-/5 3-/5  Hip abduction 3/5 3/5  Hip adduction      Hip internal rotation      Hip external rotation      Knee flexion 4/5 4/5  Knee extension 4/5 4/5  Ankle dorsiflexion NA NA  Ankle plantarflexion NA NA  Ankle inversion NA NA  Ankle eversion NA NA  (Blank rows = not tested)   TRANSFERS: Sit to stand: SBA 22" w/c requiring armrests to RW for support for stabilization with poor technique.  Stand to sit: SBA requires RW support to 22" w/c requiring armrests with poor technique.    GAIT: Gait pattern: step to pattern, decreased step length- Right, decreased stance time- Left, Left hip hike, antalgic, trunk flexed, and wide BOS Distance walked: 54' with 90* turn right & left and 180* turn Assistive device utilized: Environmental consultant - 2 wheeled and TTA prosthesis Level of assistance: Min A esp with turns Comments: excessive BUE weight bearing on RW   FUNCTIONAL TESTs:  Berg Balance Scale: 10/56   [facility] PT Assessment - 07/06/22  1300                Standardized Balance Assessment    Standardized Balance Assessment Berg Balance Test          Berg Balance Test    Sit to Stand Needs minimal aid to stand or to stabilize     Standing Unsupported Needs several tries to stand 30 seconds unsupported     Sitting with Back Unsupported but Feet Supported on Floor or Stool Able to sit safely and securely 2 minutes     Stand to Sit Sits independently, has uncontrolled descent     Transfers Able to transfer safely, definite need of hands     Standing Unsupported with Eyes Closed Needs help to keep from falling     Standing Unsupported with Feet Together Needs help to attain position and unable to hold for 15 seconds     From Standing, Reach Forward with Outstretched Arm Loses balance while trying/requires external support     From Standing Position, Pick up Object from Floor Unable to try/needs assist to keep balance     From Standing Position, Turn to Look Behind Over each Shoulder Needs assist to keep from losing balance and falling     Turn 360 Degrees Needs assistance while turning     Standing Unsupported, Alternately Place Feet on Step/Stool Needs assistance to keep from falling or unable to try     Standing Unsupported, One Foot in [employer] balance while stepping or standing     Standing on One Leg Unable to  try or needs assist to prevent fall     Total Score 10     Berg comment: BERG  < 36 high risk for falls (close to 100%) 46-51 moderate (>50%)   37-45 significant (>80%) 52-55 lower (> 25%)                   CURRENT PROSTHETIC WEAR ASSESSMENT: Patient is dependent with: skin check, residual limb care, prosthetic cleaning, ply sock cleaning, correct ply sock adjustment, proper wear schedule/adjustment, and proper weight-bearing schedule/adjustment Donning prosthesis: SBA / verbal cues Doffing prosthesis: SBA / verbal cues Prosthetic wear tolerance: liner & flexible suction socket for most of awake hours and  wears prostheses maybe ~40% of awake hours,  7 days/week Prosthetic weight bearing tolerance: 5 minutes Edema: none noted Residual limb condition: bilateral redness over patella, dry skin, normal color & temperature.  Prosthetic description: suction pin suspension with 52m silicon liner as interface & flexible inner socket, total contact socket, dynamic response Flex feet       TODAY'S TREATMENT:                                                                                                                             DATE:  08/10/2022: Prosthetic training with bilateral below the knee amputation prostheses: PT reviewed 4-point sequencing seated before beginning gait.  Patient ambulated 150 feet with forearm crutches with min guard and verbal cues.  Patient had to stop to change directions during gait. PT demonstrated how to negotiate around obstacles and maintaining pattern.  Patient negotiated obstacles 10 feet apart and around furniture with the ability to maintain fluency of gait pattern except for a 90 degree turn he did require stopping again. PT demo and verbal cues on negotiating a curb with forearm crutches.  Patient able to to ascend a 6 inch curb with mod assist and a second person nearby for safety.  When descending the 6 inch curb patient would not flex his stance lower extremity causing a balance of falls and requiring plus to assist to prevent a fall. Patient again having trouble today with low blood sugar during therapy session.  Patient given Coke and food.  This is the second session the patient has had trouble with blood sugar with activity.  PT recommended discussing with his PCP and keeping a log of food, medications and blood sugar levels.  Patient verbalized understanding  08/05/2022: Prosthetic training with bilateral below the knee amputation prostheses: PT demo and verbal cues on sit to and from stand using crutches.  Patient performed wheelchair to axillary crutches with  min assist and 2 forearm crutches with min assist. PT reviewed 4 point gait sequence.  Patient ambulated 75 feet with axillary crutches using a 4 point gait pattern with moderate assist.  Patient ambulated 75 feet with forearm crutches using a 4 point gait pattern with minimal assistance. Patient reports that he feels more comfortable with forearm crutches.  Patient stability  appeared safer with forearm crutches compared to axillary crutches. PT instructed patient that he will need tall adult forearm crutches due to his height.  The crutches need to be the appropriate height in order to facilitate an upright posture and better balance.  08/03/2022 Prosthetic training with bilateral below the knee amputation prostheses: Pt is independent in donning & cleaning.  Pt amb 200' and neg ramp & curb with RW modified independent. PT instructed in 4 point sequencing exercise seated with numbered cards. Pt verbalized & return demo understanding.   Neuromuscular reeducation: Pt able to stand 50 sec & 63 sec without UE support with supervision. PT demo and verbal cues on reaching towards the back wall over the sink to improve standing reach.  Patient verbalized and return demonstration including moving feet back so is a longer reach. PT demo and verbal cues on updated home exercise program with blue Thera-Band upper extremity resistance activities single upper extremity only.  See home exercise program below.  Patient performed 10 reps on each upper extremity for each exercise with rolling walker support with supervision.   Access Code: [email or web address] URL: [email or web address] Date: 07/29/2022 Prepared by: Jamey Reas  Exercises - standing on floor eyes open [name] movements  - 1 x daily - 7 x weekly - 1 sets - 5-10 reps - 5 seconds hold - standing on floor [name] movements eyes closed  - 1 x daily - 7 x weekly - 1 sets - 5-10 reps - 2 seconds hold - stand on foam eyes open [name] movements  - 1 x  daily - 7 x weekly - 1 sets - 5-10 reps - 5 seconds hold    07/08/22 1604  PT Education  Education Details HEP Access Code: [email or web address] and sink program  Person(s) Educated Patient;Other (comment)  Methods Explanation;Demonstration;Tactile cues;Verbal cues;Handout  Comprehension Verbalized understanding;Returned demonstration;Verbal cues required;Tactile cues required;Need further instruction     HOME EXERCISE PROGRAM: Access Code: [email or web address] URL: [email or web address] Date: 07/29/2022 Prepared by: Jamey Reas  Exercises - standing on floor eyes open [name] movements  - 1 x daily - 7 x weekly - 1 sets - 5-10 reps - 5 seconds hold - standing on floor [name] movements eyes closed  - 1 x daily - 7 x weekly - 1 sets - 5-10 reps - 2 seconds hold - stand on foam eyes open [name] movements  - 1 x daily - 7 x weekly - 1 sets - 5-10 reps - 5 seconds hold  Access Code: [email or web address] URL: [email or web address] Date: 08/03/2022 Prepared by: Jamey Reas  Exercises - Sit to Stand with Counter Support  - 2-3 x daily - 7 x weekly - 1 sets - 10 reps - 5 seconds hold - Seated Hamstring Stretch with Strap  - 2-3 x daily - 7 x weekly - 1 sets - 2-3 reps - 20-30 seconds hold - Alternating Punch with Resistance  - 1 x daily - 5 x weekly - 1 sets - 10 reps - 5 seconds hold - Standing Single Arm Shoulder Flexion with Posterior Anchored Resistance  - 1 x daily - 5 x weekly - 1 sets - 10 reps - 5 seconds hold - Standing Scapular Protraction with Resistance  - 1 x daily - 7 x weekly - 1 sets - 10 reps - 5 seconds hold - Standing alternate rows with resistance  - 1 x daily - 7 x weekly - 1 sets - 10 reps - 5 seconds hold - Single Arm Shoulder Extension with Anchored Resistance  -  1 x daily - 5 x weekly - 1 sets - 10 reps - 5 seconds hold - Standing Row with Anchored Resistance  - 1 x daily - 7 x weekly - 1 sets - 10 reps - 5 seconds hold - Alternating elbow flexion with resistance  - 1 x  daily - 7 x weekly - 1 sets - 10 reps - 5 seconds hold - Standing Bicep Curls with Resistance  - 1 x daily - 7 x weekly - 1 sets - 10 reps - 5 seconds hold   Do each exercise 1-2  times per day Do each exercise 5-10 repetitions Hold each exercise for 2 seconds to feel your location  AT [place].  Try to find this position when standing still for activities.   USE TAPE ON FLOOR TO MARK THE MIDLINE POSITION which is even with middle of sink.  You also should try to feel with your limb pressure in socket.  You are trying to feel with limb what you used to feel with the bottom of your foot.  Side to Side Shift: Moving your hips only (not shoulders): move weight onto your left leg, HOLD/FEEL pressure in socket.  Move back to equal weight on each leg, HOLD/FEEL pressure in socket. Move weight onto your right leg, HOLD/FEEL pressure in socket. Move back to equal weight on each leg, HOLD/FEEL pressure in socket. Repeat.  Start with both hands on sink, progress to right hand only hold, then left hand only hold  Front to Back Shift: Moving your hips only (not shoulders): move your weight forward onto your toes, HOLD/FEEL pressure in socket. Move your weight back to equal Flat Foot on both legs, HOLD/FEEL  pressure in socket. Move your weight back onto your heels, HOLD/FEEL  pressure in socket. Move your weight back to equal on both legs, HOLD/FEEL  pressure in socket. Repeat.  Start with both hands on sink, progress to right hand only hold, then left hand only hold  Moving Cones / Cups: With equal weight on each leg: Hold on with one hand the first time, then progress to no hand supports. Move cups from one side of sink to the other. Place cups ~2" out of your reach, progress to 10" beyond reach.  Place one hand in middle of sink and reach with other hand. Do both arms.   Overhead/Upward Reaching: alternated reaching up to top cabinets or  ceiling if no cabinets present. Keep equal weight on each leg. Start with one hand support on counter while other hand reaches and progress to no hand support with reaching.  ace one hand in middle of sink and reach with other hand. Do both arms.   5.   Looking Over Shoulders: With equal weight on each leg: alternate turning to look over your shoulders with one hand support on counter as needed.  Start with [name] motions only to look in front of shoulder, then even with shoulder and progress to looking behind you. To look to side, move [name] /eyes, then shoulder on side looking pulls back, shift more weight to side looking and pull hip back. Place one hand in middle of sink and let go with other hand so your shoulder can pull back. Switch hands to look other way.   6.  Alternating Stepping or tapping toes into lower cabinet:  Move items under cabinet out of your way. Shift your hips/pelvis so weight on  right leg & Tighten muscles in hip on right side.  SLOWLY step left leg so front of foot is in cabinet. Then step back to floor with right hip tight.  Repeat with other leg.     ASSESSMENT:   CLINICAL IMPRESSION: Patient has attended 10 PT sessions since the start of care.  He has made significant improvement in his understanding of prosthetic care, prosthetic wear time and prosthetic use with a rolling walker.  Patient improved level surface gait with forearm crutches including negotiating around obstacles.  Patient requires assistance and has safety concerns for negotiating curbs at this time.  Patient does not appear safe to use forearm crutches outside of PT yet.   OBJECTIVE IMPAIRMENTS: Abnormal gait, decreased activity tolerance, decreased balance, decreased knowledge of use of DME, decreased mobility, decreased ROM, decreased strength, impaired flexibility, postural dysfunction, prosthetic dependency , and pain.    ACTIVITY LIMITATIONS: carrying, lifting, bending, sitting, standing, squatting, stairs,  transfers, and locomotion level   PARTICIPATION LIMITATIONS: meal prep, cleaning, driving, and community activity   PERSONAL FACTORS: Fitness, Time since onset of injury/illness/exacerbation, and 3+ comorbidities: see PMH  are also affecting patient's functional outcome.    REHAB POTENTIAL: Good   CLINICAL DECISION MAKING: Evolving/moderate complexity   EVALUATION COMPLEXITY: Moderate     GOALS: Goals reviewed with patient? Yes   SHORT TERM GOALS: Target date: 08/06/2022   Patient donnes prosthesis modified independent & verbalizes proper cleaning. Baseline: SEE OBJECTIVE DATA Goal status: Met 08/03/2022 2.  Patient verbalizes understanding of initial HEP.  Baseline: SEE OBJECTIVE DATA Goal status: Met 08/03/2022   3.  Patient able to stand 60 seconds without UE support and reach 5" anteriorly with supervision.  Baseline: SEE OBJECTIVE DATA Goal status: Partially Met 08/03/2022   4. Patient ambulates 34' with RW & prosthesis with supervision. Baseline: SEE OBJECTIVE DATA Goal status: Met 08/03/2022   5. Patient negotiates ramps & curbs with RW & prosthesis with minA. Baseline: SEE OBJECTIVE DATA Goal status: Met 08/03/2022   LONG TERM GOALS: Target date: 10/01/2022   Patient demonstrates & verbalized understanding of prosthetic care to enable safe utilization of prosthesis. Baseline: SEE OBJECTIVE DATA Goal status: INITIAL   Patient tolerates prostheses wear >90% of awake hours without skin or limb pain issues. Baseline: SEE OBJECTIVE DATA Goal status: INITIAL   Berg Balance >/= 45/56 to indicate lower fall risk Baseline: SEE OBJECTIVE DATA Goal status: INITIAL   Patient ambulates >300' with prostheses and cane or less independently Baseline: SEE OBJECTIVE DATA Goal status: INITIAL   Patient negotiates ramps, curbs & stairs with single rail with prostheses and cane or less independently. Baseline: SEE OBJECTIVE DATA Goal status: INITIAL   6.   Patient  verbalizes & demonstrates understanding of how to properly use fitness equipment to return to gym. Baseline: SEE OBJECTIVE DATA Goal status: INITIAL   PLAN:   PT FREQUENCY: 2x/week   PT DURATION: 90 days / 13 weeks   PLANNED INTERVENTIONS: Therapeutic exercises, Therapeutic activity, Neuromuscular re-education, Balance training, Gait training, Patient/Family education, Self Care, Stair training, Prosthetic training, DME instructions, Electrical stimulation, Cryotherapy, Moist heat, and physical performance testing.   PLAN FOR NEXT SESSION:  Prosthetic gait with forearm crutches including ramps and curbs  Jamey Reas, PT, DPT 08/10/2022, 4:46 PM

## 2022-08-12 ENCOUNTER — Encounter: Payer: Self-pay | Admitting: Physical Therapy

## 2022-08-12 ENCOUNTER — Ambulatory Visit (INDEPENDENT_AMBULATORY_CARE_PROVIDER_SITE_OTHER): Payer: Medicare HMO | Admitting: Physical Therapy

## 2022-08-12 DIAGNOSIS — M6281 Muscle weakness (generalized): Secondary | ICD-10-CM

## 2022-08-12 DIAGNOSIS — Z7409 Other reduced mobility: Secondary | ICD-10-CM | POA: Diagnosis not present

## 2022-08-12 DIAGNOSIS — M5459 Other low back pain: Secondary | ICD-10-CM

## 2022-08-12 DIAGNOSIS — R2681 Unsteadiness on feet: Secondary | ICD-10-CM

## 2022-08-12 DIAGNOSIS — R2689 Other abnormalities of gait and mobility: Secondary | ICD-10-CM

## 2022-08-12 NOTE — Therapy (Signed)
OUTPATIENT PHYSICAL THERAPY TREATMENT NOTE   Patient Name: [name] MRN: [id number] DOB:08-Apr-1966, 56 y.o., male Today's Date: 08/12/2022  PCP: Loralee Pacas, MD REFERRING PROVIDER: Loralee Pacas, MD    END OF SESSION:   PT End of Session - 08/12/22 1257     Visit Number 11    Number of Visits 26    Date for PT Re-Evaluation 10/01/22    Authorization Type [employer]    Authorization Time Period DED MET AND OOP MET Approved  Authorization #[id number]  Tracking #[id number] pt visits 10/31-1/25/24    Authorization - Visit Number 11    Authorization - Number of Visits 12    Progress Note Due on Visit 10    PT Start Time 1300    PT Stop Time 1344    PT Time Calculation (min) 44 min    Equipment Utilized During Treatment Gait belt    Activity Tolerance Patient tolerated treatment well;Patient limited by pain    Behavior During Therapy [facility] for tasks assessed/performed                  Past Medical History:  Diagnosis Date   Acquired complex renal cyst 09/25/2019   AKI (acute kidney injury) ([place]) 07/22/2021   Cancer ([place])    renal   Chest pain 28/63/8177   Complication of anesthesia    Decubitus ulcer of sacral area 12/15/2021   Depression    Diabetes mellitus without complication ([place])    Diabetic infection of left foot ([place]) 04/17/2021   Episode of unresponsiveness 12/15/2021   Gas gangrene of foot ([place]) 07/22/2021   Healthcare maintenance 09/25/2019   Hematuria 09/25/2019   History of kidney stones    Lactic acidosis 12/15/2021   Leukocytosis 09/13/2021   Osteomyelitis ([place])    Sacral pressure injury of skin 07/27/2021   Septic shock ([place]) 07/22/2021   Severe sepsis with acute organ dysfunction ([place]) 07/22/2021   Shortness of breath 05/16/2021   Stage 3a chronic kidney disease ([place]) 11/65/7903   Systolic dysfunction    Type 2 diabetes mellitus 10/12/2019   Past Surgical History:  Procedure Laterality Date   AMPUTATION Left 07/23/2021    Procedure: LEFT BELOW KNEE AMPUTATION;  Surgeon: [name], MD;  Location: [place];  Service: Orthopedics;  Laterality: Left;   BELOW KNEE LEG AMPUTATION Right    BUBBLE STUDY  07/29/2021   Procedure: BUBBLE STUDY;  Surgeon: Sueanne Margarita, MD;  Location: [place];  Service: Cardiovascular;;   CARDIOVERSION N/A 07/29/2021   Procedure: CARDIOVERSION;  Surgeon: Sueanne Margarita, MD;  Location[facility] ENDOSCOPY;  Service: Cardiovascular;  Laterality: N/A;   RADIOLOGY WITH ANESTHESIA N/A 08/05/2021   Procedure: MRI LUMBAR WITH AND WITHOUT; THORASIC SPINE WITH AND WITHOUT WITH ANESTHESIA;  Surgeon: Radiologist, Medication, MD;  Location: [place];  Service: Radiology;  Laterality: N/A;   RADIOLOGY WITH ANESTHESIA N/A 08/07/2021   Procedure: MRI WITH LUMBER WITH AND WITHOUT CONTRAST,THORACIC WITH AND WITHOUT CONTRAST;  Surgeon: Radiologist, Medication, MD;  Location: [place];  Service: Radiology;  Laterality: N/A;   RADIOLOGY WITH ANESTHESIA N/A 09/13/2021   Procedure: MRI WITH ANESTHESIA;  Surgeon: Luanne Bras, MD;  Location: [place];  Service: Radiology;  Laterality: N/A;   TEE WITHOUT CARDIOVERSION N/A 07/29/2021   Procedure: TRANSESOPHAGEAL ECHOCARDIOGRAM (TEE);  Surgeon: Sueanne Margarita, MD;  Location[facility] ENDOSCOPY;  Service: Cardiovascular;  Laterality: N/A;   Patient Active Problem List   Diagnosis Date Noted   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[place])  07/24/2022   Type 1 diabetes mellitus with complications ([place]) 94/49/6759   [place]alaise 06/22/2022   Polycythemia 06/22/2022   Amputation below knee ([place]) 04/23/2022   PTSD (post-traumatic stress disorder) 04/23/2022   Generalized anxiety disorder 01/21/2022   Episode of unresponsiveness 12/15/2021   Acute respiratory failure with hypoxia (HCC) secondary to suspected aspiration pneumonia 12/15/2021   Renal mass 12/15/2021   Stage IV pressure ulcer of sacral region ([place]) 09/15/2021   Insomnia 16/38/4665   Chronic systolic CHF (congestive heart failure)  ([place]) 09/14/2021   Hypogonadism in male 09/14/2021   Chronic pain 09/14/2021   Pseudohyponatremia 09/13/2021   Normocytic anemia 09/13/2021   Discitis of thoracic region    Paroxysmal atrial flutter ([place]) 07/22/2021   Chronic diastolic (congestive) heart failure ([place]) 05/16/2021   Lumbar spondylosis 03/25/2021   H/O amputation of leg through tibia and fibula ([place]) 11/10/2019   Degeneration of lumbar intervertebral disc 11/10/2019   Hypercholesterolemia 11/10/2019   Left below-knee amputee ([place]) 10/30/2019   Dyslipidemia 10/30/2019   Type 1 diabetes mellitus with diabetic polyneuropathy, with long-term current use of insulin ([place]) 10/12/2019   Right below-elbow amputee 10/12/2019   Hypertensive disorder 09/25/2019   Urinary hesitancy 09/25/2019   Prosthesis adjustments 09/25/2019   Erectile dysfunction 09/25/2019   Palliative care status 09/25/2019   Pulmonary nodules 09/25/2019   Depressive disorder 09/25/2019    REFERRING DIAG: G89.4 (ICD-10-CM) - Chronic pain syndrome   Z89.512 Left Below Knee Amputation, Z89.511 history of right Below Knee Amputation.  THERAPY DIAG:  Unsteadiness on feet  Other abnormalities of gait and mobility  Muscle weakness (generalized)  Impaired functional mobility and activity tolerance  Other low back pain  Rationale for Evaluation and Treatment Rehabilitation  PERTINENT HISTORY:  DM2, CHF, HTN, chronic pain, bilateral TTAs  PRECAUTIONS: Fall  SUBJECTIVE:                                                                                                                                                                                      SUBJECTIVE STATEMENT:  His back is "killing" him even with pain meds.    PAIN:  Are you having pain? Are you having pain? Yes: NPRS scale: today  8/10 and in last week ranging 6-10/10 Pain location: middle low back Pain description: sharp Aggravating factors: extension Relieving factors: sitting  forward,   OBJECTIVE: (objective measures completed at initial evaluation unless otherwise dated)   OBJECTIVE:  COGNITION: Overall cognitive status: Within functional limits for tasks assessed   POSTURE: rounded shoulders, forward head, flexed trunk , and weight shift left   LOWER EXTREMITY ROM:   ROM P:passive  A:active Right eval Left eval  Hip flexion      Hip extension Standing A: -15* Standing A: -15*  Hip abduction      Hip adduction      Hip internal rotation      Hip external rotation      Knee flexion      Knee extension      Ankle dorsiflexion      Ankle plantarflexion      Ankle inversion      Ankle eversion       (Blank rows = not tested)   LOWER EXTREMITY MMT:   MMT Right eval Left eval  Hip flexion 4/5 4/5  Hip extension 3-/5 3-/5  Hip abduction 3/5 3/5  Hip adduction      Hip internal rotation      Hip external rotation      Knee flexion 4/5 4/5  Knee extension 4/5 4/5  Ankle dorsiflexion NA NA  Ankle plantarflexion NA NA  Ankle inversion NA NA  Ankle eversion NA NA  (Blank rows = not tested)   TRANSFERS: Sit to stand: SBA 22" w/c requiring armrests to RW for support for stabilization with poor technique.  Stand to sit: SBA requires RW support to 22" w/c requiring armrests with poor technique.    GAIT: Gait pattern: step to pattern, decreased step length- Right, decreased stance time- Left, Left hip hike, antalgic, trunk flexed, and wide BOS Distance walked: 72' with 90* turn right & left and 180* turn Assistive device utilized: Environmental consultant - 2 wheeled and TTA prosthesis Level of assistance: Min A esp with turns Comments: excessive BUE weight bearing on RW   FUNCTIONAL TESTs:  Berg Balance Scale: 10/56   [facility] PT Assessment - 07/06/22 1300                Standardized Balance Assessment    Standardized Balance Assessment Berg Balance Test          Berg Balance Test    Sit to Stand Needs minimal aid to stand or to stabilize      Standing Unsupported Needs several tries to stand 30 seconds unsupported     Sitting with Back Unsupported but Feet Supported on Floor or Stool Able to sit safely and securely 2 minutes     Stand to Sit Sits independently, has uncontrolled descent     Transfers Able to transfer safely, definite need of hands     Standing Unsupported with Eyes Closed Needs help to keep from falling     Standing Unsupported with Feet Together Needs help to attain position and unable to hold for 15 seconds     From Standing, Reach Forward with Outstretched Arm Loses balance while trying/requires external support     From Standing Position, Pick up Object from Floor Unable to try/needs assist to keep balance     From Standing Position, Turn to Look Behind Over each Shoulder Needs assist to keep from losing balance and falling     Turn 360 Degrees Needs assistance while turning     Standing Unsupported, Alternately Place Feet on Step/Stool Needs assistance to keep from falling or unable to try     Standing Unsupported, One Foot in [employer] balance while stepping or standing     Standing on One Leg Unable to try or needs assist to prevent fall     Total Score 10     Berg comment: BERG  < 36 high risk for falls (close to 100%) 46-51 moderate (>50%)  37-45 significant (>80%) 52-55 lower (> 25%)                   CURRENT PROSTHETIC WEAR ASSESSMENT: Patient is dependent with: skin check, residual limb care, prosthetic cleaning, ply sock cleaning, correct ply sock adjustment, proper wear schedule/adjustment, and proper weight-bearing schedule/adjustment Donning prosthesis: SBA / verbal cues Doffing prosthesis: SBA / verbal cues Prosthetic wear tolerance: liner & flexible suction socket for most of awake hours and wears prostheses maybe ~40% of awake hours,  7 days/week Prosthetic weight bearing tolerance: 5 minutes Edema: none noted Residual limb condition: bilateral redness over patella, dry skin, normal color  & temperature.  Prosthetic description: suction pin suspension with 63m silicon liner as interface & flexible inner socket, total contact socket, dynamic response Flex feet       TODAY'S TREATMENT:                                                                                                                             DATE:  08/12/2022: Prosthetic training with bilateral below the knee amputation prostheses: Patient ambulated 250 feet with forearm crutches with supervision. Worked on wBest boybw cones 8' apart with verbal cues.  PT reviewed need to dry limb / liners with signs of sweating or q4-5  hours. PT educated on prostheses slippage and potential for skin issues.  PT demo & verbal cues on technique for neg curb. Pt neg 4" curb with forearm crutches leading ea LE 2 reps ea with minA. Seqeunce that seems to work best of pt is  ascending - step up one LE, then both crutches followed by other LE;  descending - both crutches to floor, toes over edge, move crutches further forward, step one foot down making sure to flex stance knee, then step 2nd LE down and move crutches as needed to catch balance. Pt verbalized better understanding.   Therapeutic Exercise: Standing at door frame with single UE & BUEs reaching overhead for upright stretch. 2 reps ea UE & BUEs 2 deep breath hold.  In //bars step up & step down working on stance LE knee flexion to lower contralateral foot to floor. 4" step 10 reps ea LE. 6" step 5 reps ea LE.   08/10/2022: Prosthetic training with bilateral below the knee amputation prostheses: PT reviewed 4-point sequencing seated before beginning gait.  Patient ambulated 150 feet with forearm crutches with min guard and verbal cues.  Patient had to stop to change directions during gait. PT demonstrated how to negotiate around obstacles and maintaining pattern.  Patient negotiated obstacles 10 feet apart and around furniture with the ability to maintain fluency of gait pattern  except for a 90 degree turn he did require stopping again. PT demo and verbal cues on negotiating a curb with forearm crutches.  Patient able to to ascend a 6 inch curb with mod assist and a second person nearby for safety.  When descending the 6 inch curb  patient would not flex his stance lower extremity causing a balance of falls and requiring plus to assist to prevent a fall. Patient again having trouble today with low blood sugar during therapy session.  Patient given Coke and food.  This is the second session the patient has had trouble with blood sugar with activity.  PT recommended discussing with his PCP and keeping a log of food, medications and blood sugar levels.  Patient verbalized understanding  08/05/2022: Prosthetic training with bilateral below the knee amputation prostheses: PT demo and verbal cues on sit to and from stand using crutches.  Patient performed wheelchair to axillary crutches with min assist and 2 forearm crutches with min assist. PT reviewed 4 point gait sequence.  Patient ambulated 75 feet with axillary crutches using a 4 point gait pattern with moderate assist.  Patient ambulated 75 feet with forearm crutches using a 4 point gait pattern with minimal assistance. Patient reports that he feels more comfortable with forearm crutches.  Patient stability appeared safer with forearm crutches compared to axillary crutches. PT instructed patient that he will need tall adult forearm crutches due to his height.  The crutches need to be the appropriate height in order to facilitate an upright posture and better balance.    07/08/22 1604  PT Education  Education Details HEP Access Code: [email or web address] and sink program  Person(s) Educated Patient;Other (comment)  Methods Explanation;Demonstration;Tactile cues;Verbal cues;Handout  Comprehension Verbalized understanding;Returned demonstration;Verbal cues required;Tactile cues required;Need further instruction   HOME EXERCISE  PROGRAM:   Access Code: [email or web address] URL: [email or web address] Date: 07/29/2022 Prepared by: Jamey Reas  Exercises - standing on floor eyes open head movements  - 1 x daily - 7 x weekly - 1 sets - 5-10 reps - 5 seconds hold - standing on floor head movements eyes closed  - 1 x daily - 7 x weekly - 1 sets - 5-10 reps - 2 seconds hold - stand on foam eyes open head movements  - 1 x daily - 7 x weekly - 1 sets - 5-10 reps - 5 seconds hold  Access Code: [email or web address] URL: [email or web address] Date: 08/03/2022 Prepared by: Jamey Reas  Exercises - Sit to Stand with Counter Support  - 2-3 x daily - 7 x weekly - 1 sets - 10 reps - 5 seconds hold - Seated Hamstring Stretch with Strap  - 2-3 x daily - 7 x weekly - 1 sets - 2-3 reps - 20-30 seconds hold - Alternating Punch with Resistance  - 1 x daily - 5 x weekly - 1 sets - 10 reps - 5 seconds hold - Standing Single Arm Shoulder Flexion with Posterior Anchored Resistance  - 1 x daily - 5 x weekly - 1 sets - 10 reps - 5 seconds hold - Standing Scapular Protraction with Resistance  - 1 x daily - 7 x weekly - 1 sets - 10 reps - 5 seconds hold - Standing alternate rows with resistance  - 1 x daily - 7 x weekly - 1 sets - 10 reps - 5 seconds hold - Single Arm Shoulder Extension with Anchored Resistance  - 1 x daily - 5 x weekly - 1 sets - 10 reps - 5 seconds hold - Standing Row with Anchored Resistance  - 1 x daily - 7 x weekly - 1 sets - 10 reps - 5 seconds hold - Alternating elbow flexion with resistance  - 1 x daily - 7 x weekly - 1 sets - 10 reps - 5 seconds  hold - Standing Bicep Curls with Resistance  - 1 x daily - 7 x weekly - 1 sets - 10 reps - 5 seconds hold   Do each exercise 1-2  times per day Do each exercise 5-10 repetitions Hold each exercise for 2 seconds to feel your location  AT [place].  Try to find this position when standing still  for activities.   USE TAPE ON FLOOR TO MARK THE MIDLINE POSITION which is even with middle of sink.  You also should try to feel with your limb pressure in socket.  You are trying to feel with limb what you used to feel with the bottom of your foot.  Side to Side Shift: Moving your hips only (not shoulders): move weight onto your left leg, HOLD/FEEL pressure in socket.  Move back to equal weight on each leg, HOLD/FEEL pressure in socket. Move weight onto your right leg, HOLD/FEEL pressure in socket. Move back to equal weight on each leg, HOLD/FEEL pressure in socket. Repeat.  Start with both hands on sink, progress to right hand only hold, then left hand only hold  Front to Back Shift: Moving your hips only (not shoulders): move your weight forward onto your toes, HOLD/FEEL pressure in socket. Move your weight back to equal Flat Foot on both legs, HOLD/FEEL  pressure in socket. Move your weight back onto your heels, HOLD/FEEL  pressure in socket. Move your weight back to equal on both legs, HOLD/FEEL  pressure in socket. Repeat.  Start with both hands on sink, progress to right hand only hold, then left hand only hold  Moving Cones / Cups: With equal weight on each leg: Hold on with one hand the first time, then progress to no hand supports. Move cups from one side of sink to the other. Place cups ~2" out of your reach, progress to 10" beyond reach.  Place one hand in middle of sink and reach with other hand. Do both arms.   Overhead/Upward Reaching: alternated reaching up to top cabinets or ceiling if no cabinets present. Keep equal weight on each leg. Start with one hand support on counter while other hand reaches and progress to no hand support with reaching.  ace one hand in middle of sink and reach with other hand. Do both arms.   5.   Looking Over Shoulders: With equal weight on each leg: alternate turning to look over your shoulders with one hand support on counter as needed.  Start with head motions  only to look in front of shoulder, then even with shoulder and progress to looking behind you. To look to side, move head /eyes, then shoulder on side looking pulls back, shift more weight to side looking and pull hip back. Place one hand in middle of sink and let go with other hand so your shoulder can pull back. Switch hands to look other way.   6.  Alternating Stepping or tapping toes into lower cabinet:  Move items under cabinet out of your way. Shift your hips/pelvis so weight on right leg & Tighten muscles in hip on right side.  SLOWLY step left leg so front of foot is in cabinet. Then step back to floor with right hip tight.  Repeat with other leg.     ASSESSMENT:   CLINICAL IMPRESSION: Do step up & down in //bars first improved knee control on curb today.  Pt has improved his prosthetic gait  on level surfaces with forearm crutches. He may benefit from HEP 4" step at sink.    OBJECTIVE IMPAIRMENTS: Abnormal gait, decreased activity tolerance, decreased balance, decreased knowledge of use of DME, decreased mobility, decreased ROM, decreased strength, impaired flexibility, postural dysfunction, prosthetic dependency , and pain.    ACTIVITY LIMITATIONS: carrying, lifting, bending, sitting, standing, squatting, stairs, transfers, and locomotion level   PARTICIPATION LIMITATIONS: meal prep, cleaning, driving, and community activity   PERSONAL FACTORS: Fitness, Time since onset of injury/illness/exacerbation, and 3+ comorbidities: see PMH  are also affecting patient's functional outcome.    REHAB POTENTIAL: Good   CLINICAL DECISION MAKING: Evolving/moderate complexity   EVALUATION COMPLEXITY: Moderate     GOALS: Goals reviewed with patient? Yes   SHORT TERM GOALS: Target date: 09/03/2022   Patient verbalizes how to adjust ply fit with limb volume changes. Baseline: SEE OBJECTIVE DATA Goal status: set 08/12/2022 2.  Patient verbalizes understanding of updated HEP.  Baseline: SEE  OBJECTIVE DATA Goal status: set 08/12/2022   3.  Patient able to transfer sit to stand chairs without armrests & stabilize without external support. Baseline: SEE OBJECTIVE DATA Goal status: set 08/12/2022   4. Patient ambulates 200' with crutches & prosthesis with supervision. Baseline: SEE OBJECTIVE DATA Goal status: set 08/12/2022   5. Patient negotiates ramps & curbs with crutches & prosthesis with supervision Baseline: SEE OBJECTIVE DATA Goal status: set 08/12/2022   LONG TERM GOALS: Target date: 10/01/2022   Patient demonstrates & verbalized understanding of prosthetic care to enable safe utilization of prosthesis. Baseline: SEE OBJECTIVE DATA Goal status: INITIAL   Patient tolerates prostheses wear >90% of awake hours without skin or limb pain issues. Baseline: SEE OBJECTIVE DATA Goal status: INITIAL   Berg Balance >/= 45/56 to indicate lower fall risk Baseline: SEE OBJECTIVE DATA Goal status: INITIAL   Patient ambulates >300' with prostheses and cane or less independently Baseline: SEE OBJECTIVE DATA Goal status: INITIAL   Patient negotiates ramps, curbs & stairs with single rail with prostheses and cane or less independently. Baseline: SEE OBJECTIVE DATA Goal status: INITIAL   6.   Patient verbalizes & demonstrates understanding of how to properly use fitness equipment to return to gym. Baseline: SEE OBJECTIVE DATA Goal status: INITIAL   PLAN:   PT FREQUENCY: 2x/week   PT DURATION: 90 days / 13 weeks   PLANNED INTERVENTIONS: Therapeutic exercises, Therapeutic activity, Neuromuscular re-education, Balance training, Gait training, Patient/Family education, Self Care, Stair training, Prosthetic training, DME instructions, Electrical stimulation, Cryotherapy, Moist heat, and physical performance testing.   PLAN FOR NEXT SESSION:  Do [employer] recert. Trial step at sink for HEP. Prosthetic gait with forearm crutches including ramps and curbs  Jamey Reas, PT,  DPT 08/12/2022, 2:15 PM

## 2022-08-14 ENCOUNTER — Encounter (HOSPITAL_BASED_OUTPATIENT_CLINIC_OR_DEPARTMENT_OTHER): Payer: Medicare HMO | Admitting: General Surgery

## 2022-08-14 DIAGNOSIS — Z89511 Acquired absence of right leg below knee: Secondary | ICD-10-CM | POA: Diagnosis not present

## 2022-08-14 DIAGNOSIS — G8929 Other chronic pain: Secondary | ICD-10-CM | POA: Diagnosis not present

## 2022-08-14 DIAGNOSIS — I509 Heart failure, unspecified: Secondary | ICD-10-CM | POA: Diagnosis not present

## 2022-08-14 DIAGNOSIS — I110 Hypertensive heart disease with heart failure: Secondary | ICD-10-CM | POA: Diagnosis not present

## 2022-08-14 DIAGNOSIS — E1151 Type 2 diabetes mellitus with diabetic peripheral angiopathy without gangrene: Secondary | ICD-10-CM | POA: Diagnosis not present

## 2022-08-14 DIAGNOSIS — L89154 Pressure ulcer of sacral region, stage 4: Secondary | ICD-10-CM | POA: Diagnosis not present

## 2022-08-14 DIAGNOSIS — Z89512 Acquired absence of left leg below knee: Secondary | ICD-10-CM | POA: Diagnosis not present

## 2022-08-14 DIAGNOSIS — E11622 Type 2 diabetes mellitus with other skin ulcer: Secondary | ICD-10-CM | POA: Diagnosis not present

## 2022-08-15 NOTE — Progress Notes (Signed)
PRENTISS, POLIO ([id number]) [id number] Page 1 of 7 Visit Report for 08/14/2022 Arrival Information Details Patient Name: Date of Service: [name], [name] 08/14/2022 3:15 PM Medical Record Number: [id number] Patient Account Number: [id number] Date of Birth/Sex: Treating RN: [birth year] (56 y.o. M) Primary Care [name]: [name] Other Clinician: Referring [name]: Treating [name]: [name] in Treatment: 65 Visit Information History Since Last Visit All ordered tests and consults were completed: No Patient Arrived: [name] Added or deleted any medications: No Arrival Time: 15:21 Any new allergies or adverse reactions: No Accompanied By: wife Had a fall or experienced change in No Transfer Assistance: None activities of daily living that may affect Patient Identification Verified: Yes risk of falls: Secondary Verification Process Completed: Yes Signs or symptoms of abuse/neglect since last visito No Patient Requires Transmission-Based Precautions: No Hospitalized since last visit: No Patient Has Alerts: No Implantable device outside of the clinic excluding No cellular tissue based products placed in the [name] since last visit: Pain Present Now: No Electronic Signature(s) Signed: 08/14/2022 3:38:13 PM By: [name] Entered By: [name] on 08/14/2022 15:21:56 -------------------------------------------------------------------------------- Encounter Discharge Information Details Patient Name: Date of Service: [name], [name] 08/14/2022 3:15 PM Medical Record Number: [id number] Patient Account Number: [id number] Date of Birth/Sex: Treating RN: 08-17-1966 (56 y.o. [name] Primary Care [name]: [name] Other Clinician: Referring [name]: Treating [name]: [name] in Treatment: 31 Encounter Discharge Information Items Post Procedure  Vitals Discharge Condition: Stable Temperature (F): 98.1 Ambulatory Status: Walker Pulse (bpm): 114 Discharge Destination: Home Respiratory Rate (breaths/min): 20 Transportation: Private Auto Blood Pressure (mmHg): 142/72 Accompanied By: wife Schedule Follow-up Appointment: Yes Clinical Summary of Care: Patient Declined Electronic Signature(s) Unsigned Entered By: [name] on 08/14/2022 16:15:32 Signature(s): [name] ([id number]) [id number] Date(s): [id number] Page 2 of 7 -------------------------------------------------------------------------------- Lower Extremity Assessment Details Patient Name: Date of Service: [name], [name] 08/14/2022 3:15 PM Medical Record Number: [id number] Patient Account Number: [id number] Date of Birth/Sex: Treating RN: 23-Jun-1966 (56 y.o. [name] Primary Care Magdiel Bartles: [name] Other Clinician: Referring [name]: Treating [name]: [name] in Treatment: 31 Electronic Signature(s) Signed: 08/14/2022 4:13:22 PM By: [name] Entered By: [name] on 08/14/2022 15:39:56 -------------------------------------------------------------------------------- Multi Wound Chart Details Patient Name: Date of Service: [name], [place] 08/14/2022 3:15 PM Medical Record Number: [id number] Patient Account Number: [id number] Date of Birth/Sex: Treating RN: [birth year] (56 y.o. M) Primary Care [name]: [name] Other Clinician: Referring Ianmichael [name]: Treating [name]: [name] in Treatment: 31 Vital Signs Height(in): Capillary Blood Glucose(mg/dl): 140 Weight(lbs): Pulse(bpm): 114 Body Mass Index(BMI): Blood Pressure(mmHg): 142/72 Temperature(F): 98.1 Respiratory Rate(breaths/min): 20 [1:Photos:] [N/A:N/A] Sacrum N/A N/A Wound Location: Pressure Injury N/A N/A Wounding Event: Pressure Ulcer N/A  N/A Primary Etiology: Congestive Heart Failure, N/A N/A Comorbid History: Hypertension, Type II Diabetes, Osteomyelitis, Confinement Anxiety 07/29/2021 N/A N/A Date Acquired: 76 N/A N/A Weeks of Treatment: Open N/A N/A Wound Status: No N/A N/A Wound Recurrence: 0.9x0.9x0.7 N/A N/A Measurements L x W x D (cm) 0.636 N/A N/A A (cm) : rea 0.445 N/A N/A Volume (cm) : 89.20% N/A N/A % Reduction in A rea: 93.10% N/A N/A % Reduction in Volume: Category/Stage IV N/A N/A Classification: Medium N/A N/A Exudate A mount: Serosanguineous N/A N/A Exudate Type: red, brown N/A N/A Exudate Color: Epibole N/A N/A Wound Margin: Large (67-100%) N/A N/A Granulation A mount: Pink, Pale N/A N/A Granulation Quality: Small (1-33%) N/A N/A Necrotic A mount: Fat  Layer (Subcutaneous Tissue): Yes N/A N/A Exposed Structures: Fascia: No [name] ([id number]) [id number] Page 3 of 7 Tendon: No Muscle: No Joint: No Bone: No Small (1-33%) N/A N/A Epithelialization: Debridement - Excisional N/A N/A Debridement: Pre-procedure Verification/Time Out 15:41 N/A N/A Taken: Lidocaine 5% topical ointment N/A N/A Pain Control: Subcutaneous, Slough N/A N/A Tissue Debrided: Skin/Subcutaneous Tissue N/A N/A Level: 0.81 N/A N/A Debridement A (sq cm): rea Curette N/A N/A Instrument: Minimum N/A N/A Bleeding: Pressure N/A N/A Hemostasis A chieved: Procedure was tolerated well N/A N/A Debridement Treatment Response: 0.9x0.9x0.7 N/A N/A Post Debridement Measurements L x W x D (cm) 0.445 N/A N/A Post Debridement Volume: (cm) Category/Stage IV N/A N/A Post Debridement Stage: Excoriation: Yes N/A N/A Periwound Skin Texture: Callus: Yes Rash: Yes Maceration: Yes N/A N/A Periwound Skin Moisture: Dry/Scaly: No No Abnormalities Noted N/A N/A Periwound Skin Color: No Abnormality N/A N/A Temperature: Debridement N/A N/A Procedures Performed: Treatment  Notes Electronic Signature(s) Signed: 08/14/2022 3:57:03 PM By: [name] Entered By: [name] Maudlin on 08/14/2022 15:57:03 -------------------------------------------------------------------------------- Multi-Disciplinary Care Plan Details Patient Name: Date of Service: [name], [name] 08/14/2022 3:15 PM Medical Record Number: [id number] Patient Account Number: [id number] Date of Birth/Sex: Treating RN: 05/26/1966 (56 y.o. [name] Primary Care Dannel Rafter: [name] Other Clinician: Referring Buffie Herne: Treating Alyah Boehning/Extender: [name] in Treatment: [place] reviewed with physician Active Inactive Nutrition Nursing Diagnoses: Impaired glucose control: actual or potential Potential for alteratiion in Nutrition/Potential for imbalanced nutrition Goals: Patient/caregiver will maintain therapeutic glucose control Date Initiated: 01/08/2022 Target Resolution Date: 09/04/2022 Goal Status: Active Interventions: Assess HgA1c results as ordered upon admission and as needed Assess patient nutrition upon admission and as needed per policy Provide education on elevated blood sugars and impact on wound healing Treatment Activities: Dietary management education, guidance and counseling : 01/08/2022 Patient referred to Primary Care Physician for further nutritional evaluation : 01/08/2022 Notes: [name], [name] ([id number]) [id number] Page 4 of 7 Pressure Nursing Diagnoses: Knowledge deficit related to causes and risk factors for pressure ulcer development Knowledge deficit related to management of pressures ulcers Potential for impaired tissue integrity related to pressure, friction, moisture, and shear Goals: Patient/caregiver will verbalize understanding of pressure ulcer management Date Initiated: 01/08/2022 Target Resolution Date: 09/04/2022 Goal Status:  Active Interventions: Assess: immobility, friction, shearing, incontinence upon admission and as needed Assess offloading mechanisms upon admission and as needed Assess potential for pressure ulcer upon admission and as needed Notes: Wound/Skin Impairment Nursing Diagnoses: Impaired tissue integrity Knowledge deficit related to ulceration/compromised skin integrity Goals: Patient/caregiver will verbalize understanding of skin care regimen Date Initiated: 01/08/2022 Target Resolution Date: 09/04/2022 Goal Status: Active Ulcer/skin breakdown will have a volume reduction of 30% by week 4 Date Initiated: 01/08/2022 Date Inactivated: 02/18/2022 Target Resolution Date: 02/05/2022 Goal Status: Unmet Unmet Reason: VAC leaking Ulcer/skin breakdown will have a volume reduction of 50% by week 8 Date Initiated: 02/18/2022 Date Inactivated: 03/04/2022 Target Resolution Date: 03/05/2022 Goal Status: Unmet Unmet Reason: infection Ulcer/skin breakdown will have a volume reduction of 80% by week 12 Date Initiated: 03/04/2022 Date Inactivated: 04/01/2022 Target Resolution Date: 04/02/2022 Goal Status: Unmet Unmet Reason: too much moisture Interventions: Assess patient/caregiver ability to obtain necessary supplies Assess patient/caregiver ability to perform ulcer/skin care regimen upon admission and as needed Assess ulceration(s) every visit Provide education on ulcer and skin care Treatment Activities: Skin care regimen initiated : 01/08/2022 Topical wound management initiated : 01/08/2022 Notes: Electronic Signature(s) Signed: 08/14/2022 4:13:22 PM By: [name]  Entered By: [name] on 08/14/2022 15:43:13 -------------------------------------------------------------------------------- Pain Assessment Details Patient Name: Date of Service: [name] 08/14/2022 3:15 PM Medical Record Number: [id number] Patient Account Number: [id number] Date of Birth/Sex: Treating  RN: 13-Feb-1966 (56 y.o. M) Primary Care Onyinyechi Huante: [name] Other Clinician: Referring Shann Merrick: Treating Emmanuella Mirante/Extender: [name] in Treatment: [address] [place], Herbie [place] ([id number]) [id number] Page 5 of 7 Location of Pain Severity and Description of Pain Patient Has Paino No Site Locations Pain Management and Medication Current Pain Management: Electronic Signature(s) Signed: 08/14/2022 3:38:13 PM By: [name] Entered By: [name] on 08/14/2022 15:28:43 -------------------------------------------------------------------------------- Patient/Caregiver Education Details Patient Name: Date of Service: [name], [name] 12/8/2023andnbsp3:15 PM Medical Record Number: [id number] Patient Account Number: [id number] Date of Birth/Gender: Treating RN: [birth year] (56 y.o. [name] Primary Care Physician: [name] Other Clinician: Referring Physician: Treating Physician/Extender: [name] in Treatment: 51 Education Assessment Education Provided To: Patient Education Topics Provided Wound/Skin Impairment: Methods: Explain/Verbal Responses: Reinforcements needed, State content correctly Electronic Signature(s) Signed: 08/14/2022 4:13:22 PM By: [name] Entered By: [name] on 08/14/2022 15:43:24 -------------------------------------------------------------------------------- Wound Assessment Details Patient Name: Date of Service: [name], [name] 08/14/2022 3:15 PM [name] ([id number]) [id number] Page 6 of 7 Medical Record Number: [id number] Patient Account Number: [id number] Date of Birth/Sex: Treating RN: 01/07/1966 (56 y.o. M) Primary Care Exavior Kimmons: [name] Other Clinician: Referring Braylan Faul: Treating Marcayla Budge/Extender: [name] in Treatment: 31 Wound Status Wound  Number: 1 Primary Pressure Ulcer Etiology: Wound Location: Sacrum Wound Open Wounding Event: Pressure Injury Status: Date Acquired: 07/29/2021 Comorbid Congestive Heart Failure, Hypertension, Type II Diabetes, Weeks Of Treatment: 31 History: Osteomyelitis, Confinement Anxiety Clustered Wound: No Photos Wound Measurements Length: (cm) 0.9 Width: (cm) 0.9 Depth: (cm) 0.7 Area: (cm) 0.636 Volume: (cm) 0.445 % Reduction in Area: 89.2% % Reduction in Volume: 93.1% Epithelialization: Small (1-33%) Wound Description Classification: Category/Stage IV Wound Margin: Epibole Exudate Amount: Medium Exudate Type: Serosanguineous Exudate Color: red, brown Foul Odor After Cleansing: No Slough/Fibrino Yes Wound Bed Granulation Amount: Large (67-100%) Exposed Structure Granulation Quality: Pink, Pale Fascia Exposed: No Necrotic Amount: Small (1-33%) Fat Layer (Subcutaneous Tissue) Exposed: Yes Necrotic Quality: Adherent Slough Tendon Exposed: No Muscle Exposed: No Joint Exposed: No Bone Exposed: No Periwound Skin Texture Texture Color No Abnormalities Noted: No No Abnormalities Noted: Yes Callus: Yes Temperature / Pain Excoriation: Yes Temperature: No Abnormality Rash: Yes Moisture No Abnormalities Noted: No Dry / Scaly: No Maceration: Yes Treatment Notes Wound #1 (Sacrum) Cleanser Soap and Water Discharge Instruction: May shower and wash wound with dial antibacterial soap and water prior to dressing change. Wound Cleanser Discharge Instruction: Cleanse the wound with wound cleanser prior to applying a clean dressing using gauze sponges, not tissue or cotton balls. Peri-Wound Care [name], [name] ([id number]) [id number] Page 7 of 7 Skin Prep Discharge Instruction: Use skin prep as directed Zinc Oxide Ointment 30g tube Discharge Instruction: Apply Zinc Oxide to macerated periwound with each dressing change as needed Topical Primary  Dressing Sorbalgon AG Dressing 2x2 (in/in) Discharge Instruction: Apply to wound bed as instructed Secondary Dressing Zetuvit Plus Silicone Border Sacrum Dressing, Sm, 7x7 (in/in) Discharge Instruction: Apply silicone border over primary dressing as directed. Secured With 9M Medipore H Soft Cloth Surgical T ape, 4 x 10 (in/yd) Discharge Instruction: Secure with tape as directed. Compression Wrap Compression Stockings Add-Ons Gloves, Large Electronic Signature(s) Signed: 08/14/2022 3:38:13 PM By: [name] Entered By: [name] on 08/14/2022 15:34:43 --------------------------------------------------------------------------------  Vitals Details Patient Name: Date of Service: [name] 08/14/2022 3:15 PM Medical Record Number: [id number] Patient Account Number: [id number] Date of Birth/Sex: Treating RN: 1966/08/17 (56 y.o. M) Primary Care Damyah Gugel: [name] Other Clinician: Referring Wilda Wetherell: Treating Tavita Eastham/Extender: [name] in Treatment: 31 Vital Signs Time Taken: 03:20 Temperature (F): 98.1 Pulse (bpm): 114 Respiratory Rate (breaths/min): 20 Blood Pressure (mmHg): 142/72 Capillary Blood Glucose (mg/dl): 140 Reference Range: 80 - 120 mg / dl Electronic Signature(s) Signed: 08/14/2022 3:38:13 PM By: [name] Entered By: [name] on 08/14/2022 15:22:40

## 2022-08-15 NOTE — Progress Notes (Signed)
[name], [name] ([id number]) [id number] Page 1 of 10 Visit Report for 08/14/2022 Chief Complaint Document Details Patient Name: Date of Service: [place], [place] 08/14/2022 3:15 PM Medical Record Number: [id number] Patient Account Number: [id number] Date of Birth/Sex: Treating RN: [birth year] (56 y.o. M) Primary Care Provider: Berniece Pap Other Clinician: Referring Provider: Treating Provider/Extender: Lannie Fields in Treatment: 31 Information Obtained from: Patient Chief Complaint Patient is at the clinic for treatment of an open pressure ulcer Electronic Signature(s) Signed: 08/14/2022 3:57:09 PM By: [name] MD FACS Entered By: [name] on 08/14/2022 15:57:09 -------------------------------------------------------------------------------- Debridement Details Patient Name: Date of Service: [name], [place] 08/14/2022 3:15 PM Medical Record Number: [id number] Patient Account Number: [id number] Date of Birth/Sex: Treating RN: 1966/04/03 (56 y.o. [name] Primary Care Provider: Berniece Pap Other Clinician: Referring Provider: Treating Provider/Extender: Lannie Fields in Treatment: 31 Debridement Performed for Assessment: Wound #1 Sacrum Performed By: Physician [name], MD Debridement Type: Debridement Level of Consciousness (Pre-procedure): Awake and Alert Pre-procedure Verification/Time Out Yes - 15:41 Taken: Start Time: 15:41 Pain Control: Lidocaine 5% topical ointment T Area Debrided (L x W): otal 0.9 (cm) x 0.9 (cm) = 0.81 (cm) Tissue and other material debrided: Non-Viable, Slough, Subcutaneous, Skin: Epidermis, Slough Level: Skin/Subcutaneous Tissue Debridement Description: Excisional Instrument: Curette Bleeding: Minimum Hemostasis Achieved: Pressure Response to Treatment: Procedure was tolerated well Level of Consciousness (Post- Awake and  Alert procedure): Post Debridement Measurements of Total Wound Length: (cm) 0.9 Stage: Category/Stage IV Width: (cm) 0.9 Depth: (cm) 0.7 Volume: (cm) 0.445 Character of Wound/Ulcer Post Debridement: Improved Post Procedure Diagnosis Same as [name] ([id number]) [id number] Page 2 of 10 Notes scribed for Dr. Celine Ahr by Adline Peals, RN Electronic Signature(s) Signed: 08/14/2022 4:00:18 PM By: [name] MD FACS Signed: 08/14/2022 4:13:22 PM By: Adline Peals Entered By: Adline Peals on 08/14/2022 15:50:28 -------------------------------------------------------------------------------- HPI Details Patient Name: Date of Service: [name], [place] 08/14/2022 3:15 PM Medical Record Number: [id number] Patient Account Number: [id number] Date of Birth/Sex: Treating RN: [birth year] (56 y.o. M) Primary Care Provider: Berniece Pap Other Clinician: Referring Provider: Treating Provider/Extender: Lannie Fields in Treatment: 64 History of Present Illness HPI Description: ADMISSION 01/08/2022 This is a 56 year old male with a past medical history notable for type 2 diabetes mellitus (last A1c was 8.6) congestive heart failure, hypertension, chronic pain, and bilateral below-knee amputations. His most recent amputation was in November 2022. While he was in the hospital, he developed a sacral pressure ulcer. He was subsequently in a skilled nursing facility for some time. He was discharged with home health and had been in a wound VAC, but was then admitted to the hospital last week when the wound appeared to be worsening. Apparently the periwound skin was macerated and the device that he had been using was leaking quite a bit. Evaluation while in the hospital included a consultation with infectious disease who did not think he had any evidence of osteomyelitis, plastic surgery who felt that he was not an  appropriate flap candidate (their note also states that he is not interested in a flap) and wound care who took him out of the wound VAC and initiated wet-to-dry dressing changes. He has a new wound VAC from [facility] on order, anticipated delivery today. The wound itself is fairly small and isolated to the sacrum. There is muscle exposed. No bone is appreciated but it is palpable beneath the surface. The muscle itself is bit pale and there is  heaped up epibole around the wound edges. No significant odor or drainage. 01/14/2022: His wound VAC was not initiated until this past Friday. He has not had any issues with the VAC but today we found that the bridge foam was applied directly to the skin rather than over a layer of adhesive drape. His home health nurse also requested that we consider applying silver collagen to the wound bed in addition to the VAC. There is still a little bit of heaped up senescent skin around the wound periphery. No significant change to the wound dimensions. 01/21/2022: No significant change to the wound dimensions. The senescent skin has not reaccumulated. The periwound skin remains a bit macerated but without any obvious breakdown. The wound surface itself has a shiny appearance with a little bit of slough accumulation; no true robust granulation tissue at this time. 01/28/2022: The wound is about the same size but a little bit shallower. There is a little bit of senescent skin reaccumulation at the cranial aspect. The periwound skin is red but not macerated and without any tissue breakdown. The wound still does not have the most robust surface. There is a bit of slough accumulation. 02/04/2022: The wound is a bit smaller and the undermining has come in somewhat. There continues to be granulation tissue formation within the wound bed. No significant slough accumulation and his periwound skin is in better condition. 02/18/2022: The wound stinks today. There is no obvious pus but the  drainage and wound itself are malodorous. He continues to have heaped up tissue within the wound bed that is rather grayish and not particularly robust-appearing. He is very angry today with his situation. 02/25/2022: Last week, in response to the odor coming from the wound, I took a culture and prescribed topical gentamicin as well as oral cefdinir. Apparently [facility] contacted him about a topical compounded antibiotic, but he did not realize this and he hung up on them. Today, the odor is no longer present. There is some senescent skin around the wound margins as well as continued heaped up granulation tissue in the center of the wound. The undermining continues to contract. 03/04/2022: The wound continues to contract and look better. He still has heaped up hypertrophic granulation tissue near the orifice. No odor was coming from the wound today. He is awaiting [place] delivery. 03/18/2022: 2-week follow-up. [facility] topical antibiotic has been initiated. The chemical cauterization of the hypertrophic granulation tissue was quite successful and the surface is much flatter today. He has heaped up senescent skin around the perimeter. His home health providers have figured out a way to keep the wound VAC from losing suction by bolstering with DuoDERM. Overall there has been significant improvement since our last visit. 04/01/2022: 2-week follow-up. The wound continues to contract. Once again, there is heaped up senescent skin around the perimeter. He has a little bit of skin breakdown in the distribution of the adhesive drape from the wound VAC. No odor or purulent drainage. No concern for infection. 04/15/2022: 2-week follow-up. He has developed a fairly substantial rash from the drape adhesive for his wound VAC. The periwound has a lot of heaped up epiboly at the margins. The tissue in the wound bed is a little bit pale but there is no odor or purulent drainage. 04/22/2022: Last week we discontinued the  VAC. Today, the skin around his wound is in significantly improved condition. His rash is resolving. He does have some heaped up senescent skin around the wound margin and a layer  of slough on the wound surface, but there is no concern for active infection. 04/29/2022: The wound continues to contract. The surface is clean. He continues to build up senescent skin around the wound edges that subsequently get a little bit macerated. No concern for infection. 05/06/2022: The wound is smaller again today in all dimensions. The surface is clean. He has his usual accumulation of macerated senescent skin around the wound edges. 05/13/2022: Continued wound contracture. The undermining has decreased quite a bit. Clean surface. Senescent skin heaped up around the wound edges, as per [facility], Herbie [place] ([id number]) [phone].pdf Page 3 of 10 usual. 05/22/2022: The macerated senescent skin has accumulated once again. The wound dimensions are slightly smaller and the surface is clean. He has been having difficulty keeping the packing in the wound due to the large foam border dressing that he prefers. 06/03/2022: The wound is smaller and shallower with less undermining. He has built up macerated senescent skin around the margins, as per usual. He and his friend have figured out a way to make sure that his packing stays in the wound with his [place]. 06/12/2022: The wound is a little bit shallower again today. He still has accumulated senescent periwound skin, but otherwise the wound is clean. He will be undergoing bilateral recurrent inguinal hernia repair and umbilical hernia repair next week. 10/20 sacral pressure ulcer. using [facility] and backing wet to dry. Has developed a rash sur rounding the wound 07/03/2022: Continued contracture of the wound. It is very clean and there is no odor or purulent drainage. Continued buildup of senescent skin around the margin. 07/13/2022: There is less  undermining present today. The orifice continues to contract. He continues to accumulate senescent skin around the perimeter. The rash around the wound has improved. 07/27/2022: The wound is smaller again today and the undermining is closed then. There is senescent skin accumulation, as usual. 08/07/2022: The wound continues to contract but has its usual accumulation of periwound senescent skin. Unfortunately, the periwound skin looks like it has gotten more moist. It is red and angry and the patient says it is more painful. 08/14/2022: The wound is smaller again this week. His periwound skin is in much better condition. There is still senescent skin around the wound margins and a bit of slough on the surface. Electronic Signature(s) Signed: 08/14/2022 3:57:42 PM By: [name] MD FACS Entered By: [name] on 08/14/2022 15:57:42 -------------------------------------------------------------------------------- Physical Exam Details Patient Name: Date of Service: [name], [place] 08/14/2022 3:15 PM Medical Record Number: [id number] Patient Account Number: [id number] Date of Birth/Sex: Treating RN: 1966-03-23 (56 y.o. M) Primary Care Provider: Berniece Pap Other Clinician: Referring Provider: Treating Provider/Extender: Lannie Fields in Treatment: 31 Constitutional Slightly hypertensive. Tachycardic, asymptomatic.. . . No acute distress. Respiratory Normal work of breathing on room air. Notes 08/14/2022: The wound is smaller again this week. His periwound skin is in much better condition. There is still senescent skin around the wound margins and a bit of slough on the surface. Electronic Signature(s) Signed: 08/14/2022 3:59:05 PM By: [name] MD FACS Entered By: [name] on 08/14/2022 15:59:05 -------------------------------------------------------------------------------- Physician Orders Details Patient Name: Date of Service: [name], [place] 08/14/2022 3:15 PM Medical Record Number: [id number] Patient Account Number: [id number] Date of Birth/Sex: Treating RN: 02-12-1966 (56 y.o. [name] Primary Care Provider: Berniece Pap Other Clinician: Referring Provider: Treating Provider/Extender: Lannie Fields in Treatment: [address], [place] ([id number]) [id number] Page 4 of 10 Verbal /  Phone Orders: No Diagnosis Coding ICD-10 Coding Code Description L89.154 Pressure ulcer of sacral region, stage 4 Z89.512 Acquired absence of left leg below knee Z89.511 Acquired absence of right leg below knee Follow-up Appointments ppointment in 1 week. - Dr. Celine Ahr - room 1 Return A Anesthetic Wound #1 Sacrum (In clinic) Topical Lidocaine 4% applied to wound bed Bathing/ Shower/ Hygiene May shower with protection but do not get wound dressing(s) wet. Off-Loading Gel mattress overlay (Group 1) Turn and reposition every 2 hours Wound Treatment Wound #1 - Sacrum Cleanser: Soap and Water 3 x Per Week/30 Days Discharge Instructions: May shower and wash wound with dial antibacterial soap and water prior to dressing change. Cleanser: Wound Cleanser 3 x Per Week/30 Days Discharge Instructions: Cleanse the wound with wound cleanser prior to applying a clean dressing using gauze sponges, not tissue or cotton balls. Peri-Wound Care: Skin Prep 3 x Per Week/30 Days Discharge Instructions: Use skin prep as directed Peri-Wound Care: Zinc Oxide Ointment 30g tube 3 x Per Week/30 Days Discharge Instructions: Apply Zinc Oxide to macerated periwound with each dressing change as needed Prim Dressing: Sorbalgon AG Dressing 2x2 (in/in) 3 x Per Week/30 Days ary Discharge Instructions: Apply to wound bed as instructed Secondary Dressing: Zetuvit Plus Silicone Border Sacrum Dressing, Sm, 7x7 (in/in) 3 x Per Week/30 Days Discharge Instructions: Apply silicone border over primary dressing  as directed. Secured With: 40M Medipore H Soft Cloth Surgical T ape, 4 x 10 (in/yd) 3 x Per Week/30 Days Discharge Instructions: Secure with tape as directed. Add-Ons: Gloves, Large 3 x Per Week/30 Days Patient Medications llergies: Bactrim, Ceprotin ([place]), ciprofloxacin, Levaquin A Notifications Medication Indication Start End 08/14/2022 lidocaine DOSE topical 5 % ointment - ointment topical Electronic Signature(s) Signed: 08/14/2022 4:00:18 PM By: [name] MD FACS Entered By: [name] on 08/14/2022 15:59:19 -------------------------------------------------------------------------------- Problem List Details Patient Name: Date of Service: [name], [name] 08/14/2022 3:15 PM [name] ([id number]) [id number] Page 5 of 10 Medical Record Number: [id number] Patient Account Number: [id number] Date of Birth/Sex: Treating RN: 1965/10/08 (56 y.o. M) Primary Care Provider: Berniece Pap Other Clinician: Referring Provider: Treating Provider/Extender: Lannie Fields in Treatment: 31 Active Problems ICD-10 Encounter Code Description Active Date MDM Diagnosis L89.154 Pressure ulcer of sacral region, stage 4 01/08/2022 No Yes Z89.512 Acquired absence of left leg below knee 01/08/2022 No Yes Z89.511 Acquired absence of right leg below knee 01/08/2022 No Yes Inactive Problems Resolved Problems Electronic Signature(s) Signed: 08/14/2022 3:56:57 PM By: [name] MD FACS Entered By: [name] on 08/14/2022 15:56:57 -------------------------------------------------------------------------------- Progress Note Details Patient Name: Date of Service: [name], [name] 08/14/2022 3:15 PM Medical Record Number: [id number] Patient Account Number: [id number] Date of Birth/Sex: Treating RN: May 13, 1966 (56 y.o. M) Primary Care Provider: Berniece Pap Other Clinician: Referring Provider: Treating Provider/Extender:  Lannie Fields in Treatment: 31 Subjective Chief Complaint Information obtained from Patient Patient is at the clinic for treatment of an open pressure ulcer History of Present Illness (HPI) ADMISSION 01/08/2022 This is a 56 year old male with a past medical history notable for type 2 diabetes mellitus (last A1c was 8.6) congestive heart failure, hypertension, chronic pain, and bilateral below-knee amputations. His most recent amputation was in November 2022. While he was in the hospital, he developed a sacral pressure ulcer. He was subsequently in a skilled nursing facility for some time. He was discharged with home health and had been in a wound VAC, but was then admitted to the hospital  last week when the wound appeared to be worsening. Apparently the periwound skin was macerated and the device that he had been using was leaking quite a bit. Evaluation while in the hospital included a consultation with infectious disease who did not think he had any evidence of osteomyelitis, plastic surgery who felt that he was not an appropriate flap candidate (their note also states that he is not interested in a flap) and wound care who took him out of the wound VAC and initiated wet-to-dry dressing changes. He has a new wound VAC from [facility] on order, anticipated delivery today. The wound itself is fairly small and isolated to the sacrum. There is muscle exposed. No bone is appreciated but it is palpable beneath the surface. The muscle itself is bit pale and there is heaped up epibole around the wound edges. No significant odor or drainage. 01/14/2022: His wound VAC was not initiated until this past Friday. He has not had any issues with the VAC but today we found that the bridge foam was applied directly to the skin rather than over a layer of adhesive drape. His home health nurse also requested that we consider applying silver collagen to the wound bed in addition to the VAC. There is  still a little bit of heaped up senescent skin around the wound periphery. No significant change to the wound dimensions. 01/21/2022: No significant change to the wound dimensions. The senescent skin has not reaccumulated. The periwound skin remains a bit macerated but without any obvious breakdown. The wound surface itself has a shiny appearance with a little bit of slough accumulation; no true robust granulation tissue at this time. [name], [name] ([id number]) [id number] Page 6 of 10 01/28/2022: The wound is about the same size but a little bit shallower. There is a little bit of senescent skin reaccumulation at the cranial aspect. The periwound skin is red but not macerated and without any tissue breakdown. The wound still does not have the most robust surface. There is a bit of slough accumulation. 02/04/2022: The wound is a bit smaller and the undermining has come in somewhat. There continues to be granulation tissue formation within the wound bed. No significant slough accumulation and his periwound skin is in better condition. 02/18/2022: The wound stinks today. There is no obvious pus but the drainage and wound itself are malodorous. He continues to have heaped up tissue within the wound bed that is rather grayish and not particularly robust-appearing. He is very angry today with his situation. 02/25/2022: Last week, in response to the odor coming from the wound, I took a culture and prescribed topical gentamicin as well as oral cefdinir. Apparently [facility] contacted him about a topical compounded antibiotic, but he did not realize this and he hung up on them. Today, the odor is no longer present. There is some senescent skin around the wound margins as well as continued heaped up granulation tissue in the center of the wound. The undermining continues to contract. 03/04/2022: The wound continues to contract and look better. He still has heaped up hypertrophic  granulation tissue near the orifice. No odor was coming from the wound today. He is awaiting [place] delivery. 03/18/2022: 2-week follow-up. [facility] topical antibiotic has been initiated. The chemical cauterization of the hypertrophic granulation tissue was quite successful and the surface is much flatter today. He has heaped up senescent skin around the perimeter. His home health providers have figured out a way to keep the wound VAC from losing suction by bolstering  with DuoDERM. Overall there has been significant improvement since our last visit. 04/01/2022: 2-week follow-up. The wound continues to contract. Once again, there is heaped up senescent skin around the perimeter. He has a little bit of skin breakdown in the distribution of the adhesive drape from the wound VAC. No odor or purulent drainage. No concern for infection. 04/15/2022: 2-week follow-up. He has developed a fairly substantial rash from the drape adhesive for his wound VAC. The periwound has a lot of heaped up epiboly at the margins. The tissue in the wound bed is a little bit pale but there is no odor or purulent drainage. 04/22/2022: Last week we discontinued the VAC. Today, the skin around his wound is in significantly improved condition. His rash is resolving. He does have some heaped up senescent skin around the wound margin and a layer of slough on the wound surface, but there is no concern for active infection. 04/29/2022: The wound continues to contract. The surface is clean. He continues to build up senescent skin around the wound edges that subsequently get a little bit macerated. No concern for infection. 05/06/2022: The wound is smaller again today in all dimensions. The surface is clean. He has his usual accumulation of macerated senescent skin around the wound edges. 05/13/2022: Continued wound contracture. The undermining has decreased quite a bit. Clean surface. Senescent skin heaped up around the wound edges, as  per usual. 05/22/2022: The macerated senescent skin has accumulated once again. The wound dimensions are slightly smaller and the surface is clean. He has been having difficulty keeping the packing in the wound due to the large foam border dressing that he prefers. 06/03/2022: The wound is smaller and shallower with less undermining. He has built up macerated senescent skin around the margins, as per usual. He and his friend have figured out a way to make sure that his packing stays in the wound with his [place]. 06/12/2022: The wound is a little bit shallower again today. He still has accumulated senescent periwound skin, but otherwise the wound is clean. He will be undergoing bilateral recurrent inguinal hernia repair and umbilical hernia repair next week. 10/20 sacral pressure ulcer. using [facility] and backing wet to dry. Has developed a rash sur rounding the wound 07/03/2022: Continued contracture of the wound. It is very clean and there is no odor or purulent drainage. Continued buildup of senescent skin around the margin. 07/13/2022: There is less undermining present today. The orifice continues to contract. He continues to accumulate senescent skin around the perimeter. The rash around the wound has improved. 07/27/2022: The wound is smaller again today and the undermining is closed then. There is senescent skin accumulation, as usual. 08/07/2022: The wound continues to contract but has its usual accumulation of periwound senescent skin. Unfortunately, the periwound skin looks like it has gotten more moist. It is red and angry and the patient says it is more painful. 08/14/2022: The wound is smaller again this week. His periwound skin is in much better condition. There is still senescent skin around the wound margins and a bit of slough on the surface. Patient History Information obtained from Patient, Caregiver, Chart. Family History Cancer - Father, Heart Disease - Father,Paternal  Grandparents, Hypertension - Father, No family history of Diabetes, Hereditary Spherocytosis, Kidney Disease, Lung Disease, Seizures, Stroke, Thyroid Problems, Tuberculosis. Social History Never smoker, Marital Status - Divorced, Alcohol Use - Never, Drug Use - No History, Caffeine Use - Moderate - coffee. Medical History Eyes Denies history of Cataracts,  Glaucoma, Optic Neuritis Ear/Nose/Mouth/Throat Denies history of Chronic sinus problems/congestion, Middle ear problems Cardiovascular Patient has history of Congestive Heart Failure, Hypertension Endocrine Patient has history of Type II Diabetes Denies history of Type I Diabetes Genitourinary Denies history of End Stage Renal Disease Integumentary (Skin) Denies history of History of Burn Musculoskeletal Patient has history of Osteomyelitis - S1 and coccyx Oncologic AKIF, [place] ([id number]) [id number] Page 7 of 10 Denies history of Received Chemotherapy, Received Radiation Psychiatric Patient has history of Confinement Anxiety Denies history of Anorexia/bulimia Hospitalization/Surgery History - bil BKA. Medical A Surgical History Notes nd Respiratory pulmonary nodules Genitourinary renal mass, urinary hesitancy Musculoskeletal discitis of thoracic region Objective Constitutional Slightly hypertensive. Tachycardic, asymptomatic.Marland Kitchen No acute distress. Vitals Time Taken: 3:20 AM, Temperature: 98.1 F, Pulse: 114 bpm, Respiratory Rate: 20 breaths/min, Blood Pressure: 142/72 mmHg, Capillary Blood Glucose: 140 mg/dl. Respiratory Normal work of breathing on room air. General Notes: 08/14/2022: The wound is smaller again this week. His periwound skin is in much better condition. There is still senescent skin around the wound margins and a bit of slough on the surface. Integumentary (Hair, Skin) Wound #1 status is Open. Original cause of wound was Pressure Injury. The date acquired was: 07/29/2021.  The wound has been in treatment 31 weeks. The wound is located on the Sacrum. The wound measures 0.9cm length x 0.9cm width x 0.7cm depth; 0.636cm^2 area and 0.445cm^3 volume. There is Fat Layer (Subcutaneous Tissue) exposed. There is a medium amount of serosanguineous drainage noted. The wound margin is epibole. There is large (67-100%) pink, pale granulation within the wound bed. There is a small (1-33%) amount of necrotic tissue within the wound bed including Adherent Slough. The periwound skin appearance had no abnormalities noted for color. The periwound skin appearance exhibited: Callus, Excoriation, Rash, Maceration. The periwound skin appearance did not exhibit: Dry/Scaly. Periwound temperature was noted as No Abnormality. 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 There was a Excisional Skin/Subcutaneous Tissue Debridement with a total area of 0.81 sq cm performed by [name], MD. With the following instrument(s): Curette to remove Non-Viable tissue/material. Material removed includes Subcutaneous Tissue, Slough, and Skin: Epidermis after achieving pain control using Lidocaine 5% topical ointment. No specimens were taken. A time out was conducted at 15:41, prior to the start of the procedure. A Minimum amount of bleeding was controlled with Pressure. The procedure was tolerated well. Post Debridement Measurements: 0.9cm length x 0.9cm width x 0.7cm depth; 0.445cm^3 volume. Post debridement Stage noted as Category/Stage IV. Character of Wound/Ulcer Post Debridement is improved. Post procedure Diagnosis Wound #1: Same as Pre-Procedure General Notes: scribed for Dr. Celine Ahr by Adline Peals, RN. Plan Follow-up Appointments: Return Appointment in 1 week. - Dr. Celine Ahr  - room 1 Anesthetic: ARNALDO, [name] ([id number]) [id number] Page 8 of 10 Wound #1 Sacrum: (In clinic) Topical Lidocaine 4% applied to wound bed Bathing/ Shower/ Hygiene: May shower with protection but do not get wound dressing(s) wet. Off-Loading: Gel mattress overlay (Group 1) Turn and reposition every 2 hours The following medication(s) was prescribed: lidocaine topical 5 % ointment ointment topical was prescribed at facility WOUND #1: - Sacrum Wound Laterality: Cleanser: Soap and Water 3 x Per Week/30 Days Discharge Instructions: May shower and wash wound with dial antibacterial soap and water prior to dressing change. Cleanser:  Wound Cleanser 3 x Per Week/30 Days Discharge Instructions: Cleanse the wound with wound cleanser prior to applying a clean dressing using gauze sponges, not tissue or cotton balls. Peri-Wound Care: Skin Prep 3 x Per Week/30 Days Discharge Instructions: Use skin prep as directed Peri-Wound Care: Zinc Oxide Ointment 30g tube 3 x Per Week/30 Days Discharge Instructions: Apply Zinc Oxide to macerated periwound with each dressing change as needed Prim Dressing: Sorbalgon AG Dressing 2x2 (in/in) 3 x Per Week/30 Days ary Discharge Instructions: Apply to wound bed as instructed Secondary Dressing: Zetuvit Plus Silicone Border Sacrum Dressing, Sm, 7x7 (in/in) 3 x Per Week/30 Days Discharge Instructions: Apply silicone border over primary dressing as directed. Secured With: 17M Medipore H Soft Cloth Surgical T ape, 4 x 10 (in/yd) 3 x Per Week/30 Days Discharge Instructions: Secure with tape as directed. Add-Ons: Gloves, Large 3 x Per Week/30 Days 08/14/2022: The wound is smaller again this week. His periwound skin is in much better condition. There is still senescent skin around the wound margins and a bit of slough on the surface. I used a curette to debride the senescent skin from around the wound edges. I then aggressively debrided the  slough and nonviable subcutaneous tissue from the wound surface to try and reactivate the healing cascade. We will continue to pack the wound with silver alginate. Follow-up in 1 week. Electronic Signature(s) Signed: 08/14/2022 3:59:50 PM By: [name] MD FACS Entered By: [name] on 08/14/2022 15:59:50 -------------------------------------------------------------------------------- HxROS Details Patient Name: Date of Service: [name], [place] 08/14/2022 3:15 PM Medical Record Number: [id number] Patient Account Number: [id number] Date of Birth/Sex: Treating RN: 31-Mar-1966 (56 y.o. M) Primary Care Provider: Berniece Pap Other Clinician: Referring Provider: Treating Provider/Extender: Lannie Fields in Treatment: 31 Information Obtained From Patient Caregiver Chart Eyes Medical History: Negative for: Cataracts; Glaucoma; Optic Neuritis Ear/Nose/Mouth/Throat Medical History: Negative for: Chronic sinus problems/congestion; Middle ear problems Respiratory Medical History: Past Medical History Notes: pulmonary nodules Cardiovascular Medical History: Positive for: Congestive Heart Failure; Hypertension [name], [name] ([id number]) [id number] Page 9 of 10 Endocrine Medical History: Positive for: Type II Diabetes Negative for: Type I Diabetes Time with diabetes: since 2004 Treated with: Insulin Blood sugar tested every day: Yes Tested : 4 times per day Genitourinary Medical History: Negative for: End Stage Renal Disease Past Medical History Notes: renal mass, urinary hesitancy Integumentary (Skin) Medical History: Negative for: History of Burn Musculoskeletal Medical History: Positive for: Osteomyelitis - S1 and coccyx Past Medical History Notes: discitis of thoracic region Oncologic Medical History: Negative for: Received Chemotherapy; Received Radiation Psychiatric Medical History: Positive for:  Confinement Anxiety Negative for: Anorexia/bulimia Immunizations Pneumococcal Vaccine: Received Pneumococcal Vaccination: No Implantable Devices No devices added Hospitalization / Surgery History Type of Hospitalization/Surgery bil BKA Family and Social History Cancer: Yes - Father; Diabetes: No; Heart Disease: Yes - Father,Paternal Grandparents; Hereditary Spherocytosis: No; Hypertension: Yes - Father; Kidney Disease: No; Lung Disease: No; Seizures: No; Stroke: No; Thyroid Problems: No; Tuberculosis: No; Never smoker; Marital Status - Divorced; Alcohol Use: Never; Drug Use: No History; Caffeine Use: Moderate - coffee; Financial Concerns: No; Food, Clothing or Shelter Needs: No; Support System Lacking: No; Transportation Concerns: Yes - hurts to transfer Electronic Signature(s) Signed: 08/14/2022 4:00:18 PM By: [name] MD FACS Entered By: [name] on 08/14/2022 15:57:47 -------------------------------------------------------------------------------- SuperBill Details Patient Name: Date of Service: [name], [name] 08/14/2022 Medical Record Number: [id number] Patient Account Number: [id number] Date of Birth/Sex: Treating RN: 09/01/1966 (56 y.o. M) Primary Care  Provider: Berniece Pap Other Clinician: Referring Provider: Treating Provider/Extender: Lannie Fields in Treatment: [address], [place] ([id number]) [id number] Page 10 of 10 Diagnosis Coding ICD-10 Codes Code Description L89.154 Pressure ulcer of sacral region, stage 4 Z89.512 Acquired absence of left leg below knee Z89.511 Acquired absence of right leg below knee Facility Procedures : CPT4 Code: [id number] Description: 98921 - DEB SUBQ TISSUE 20 SQ CM/< ICD-10 Diagnosis Description L89.154 Pressure ulcer of sacral region, stage 4 Modifier: Quantity: 1 Physician Procedures : CPT4 Code Description Modifier [id number] 99214 - WC PHYS LEVEL 4 - EST PT 25  ICD-10 Diagnosis Description L89.154 Pressure ulcer of sacral region, stage 4 Z89.512 Acquired absence of left leg below knee Z89.511 Acquired absence of right leg below knee Quantity: 1 : [id number] 11042 - WC PHYS SUBQ TISS 20 SQ CM ICD-10 Diagnosis Description L89.154 Pressure ulcer of sacral region, stage 4 Quantity: 1 Electronic Signature(s) Signed: 08/14/2022 4:00:06 PM By: [name] MD FACS Entered By: [name] on 08/14/2022 16:00:05

## 2022-08-17 ENCOUNTER — Ambulatory Visit (INDEPENDENT_AMBULATORY_CARE_PROVIDER_SITE_OTHER): Payer: Medicare HMO | Admitting: Physical Therapy

## 2022-08-17 ENCOUNTER — Telehealth: Payer: Self-pay | Admitting: Orthopedic Surgery

## 2022-08-17 ENCOUNTER — Encounter: Payer: Self-pay | Admitting: Physical Therapy

## 2022-08-17 DIAGNOSIS — M6281 Muscle weakness (generalized): Secondary | ICD-10-CM

## 2022-08-17 DIAGNOSIS — Z7409 Other reduced mobility: Secondary | ICD-10-CM | POA: Diagnosis not present

## 2022-08-17 DIAGNOSIS — M5459 Other low back pain: Secondary | ICD-10-CM

## 2022-08-17 DIAGNOSIS — G4700 Insomnia, unspecified: Secondary | ICD-10-CM | POA: Diagnosis not present

## 2022-08-17 DIAGNOSIS — R2681 Unsteadiness on feet: Secondary | ICD-10-CM

## 2022-08-17 DIAGNOSIS — L89154 Pressure ulcer of sacral region, stage 4: Secondary | ICD-10-CM | POA: Diagnosis not present

## 2022-08-17 DIAGNOSIS — R2689 Other abnormalities of gait and mobility: Secondary | ICD-10-CM

## 2022-08-17 DIAGNOSIS — E291 Testicular hypofunction: Secondary | ICD-10-CM | POA: Diagnosis not present

## 2022-08-17 DIAGNOSIS — I5022 Chronic systolic (congestive) heart failure: Secondary | ICD-10-CM | POA: Diagnosis not present

## 2022-08-17 NOTE — Therapy (Signed)
OUTPATIENT PHYSICAL THERAPY TREATMENT NOTE   Patient Name: [name] MRN: [id number] DOB:02-11-1966, 56 y.o., male Today's Date: 08/17/2022  PCP: Loralee Pacas, MD REFERRING PROVIDER: Loralee Pacas, MD    END OF SESSION:   PT End of Session - 08/17/22 1300     Visit Number 12    Number of Visits 26    Date for PT Re-Evaluation 10/01/22    Authorization Type [employer]    Authorization Time Period DED MET AND OOP MET Approved  Authorization #[id number]  Tracking #[id number] pt visits 10/31-1/25/24    Authorization - Visit Number 12    Authorization - Number of Visits 12    Progress Note Due on Visit 10    PT Start Time 1300    PT Stop Time 8657    PT Time Calculation (min) 45 min    Equipment Utilized During Treatment Gait belt    Activity Tolerance Patient tolerated treatment well;Patient limited by pain    Behavior During Therapy [facility] for tasks assessed/performed                   Past Medical History:  Diagnosis Date   Acquired complex renal cyst 09/25/2019   AKI (acute kidney injury) ([place]) 07/22/2021   Cancer ([place])    renal   Chest pain 84/69/6295   Complication of anesthesia    Decubitus ulcer of sacral area 12/15/2021   Depression    Diabetes mellitus without complication ([place])    Diabetic infection of left foot ([place]) 04/17/2021   Episode of unresponsiveness 12/15/2021   Gas gangrene of foot ([place]) 07/22/2021   Healthcare maintenance 09/25/2019   Hematuria 09/25/2019   History of kidney stones    Lactic acidosis 12/15/2021   Leukocytosis 09/13/2021   Osteomyelitis ([place])    Sacral pressure injury of skin 07/27/2021   Septic shock ([place]) 07/22/2021   Severe sepsis with acute organ dysfunction ([place]) 07/22/2021   Shortness of breath 05/16/2021   Stage 3a chronic kidney disease ([place]) 28/41/3244   Systolic dysfunction    Type 2 diabetes mellitus 10/12/2019   Past Surgical History:  Procedure Laterality Date   AMPUTATION Left 07/23/2021    Procedure: LEFT BELOW KNEE AMPUTATION;  Surgeon: [name], MD;  Location: [place];  Service: Orthopedics;  Laterality: Left;   BELOW KNEE LEG AMPUTATION Right    BUBBLE STUDY  07/29/2021   Procedure: BUBBLE STUDY;  Surgeon: Sueanne Margarita, MD;  Location: [place];  Service: Cardiovascular;;   CARDIOVERSION N/A 07/29/2021   Procedure: CARDIOVERSION;  Surgeon: Sueanne Margarita, MD;  Location: [facility] ENDOSCOPY;  Service: Cardiovascular;  Laterality: N/A;   RADIOLOGY WITH ANESTHESIA N/A 08/05/2021   Procedure: MRI LUMBAR WITH AND WITHOUT; THORASIC SPINE WITH AND WITHOUT WITH ANESTHESIA;  Surgeon: Radiologist, Medication, MD;  Location: [place];  Service: Radiology;  Laterality: N/A;   RADIOLOGY WITH ANESTHESIA N/A 08/07/2021   Procedure: MRI WITH LUMBER WITH AND WITHOUT CONTRAST,THORACIC WITH AND WITHOUT CONTRAST;  Surgeon: Radiologist, Medication, MD;  Location: [place];  Service: Radiology;  Laterality: N/A;   RADIOLOGY WITH ANESTHESIA N/A 09/13/2021   Procedure: MRI WITH ANESTHESIA;  Surgeon: Luanne Bras, MD;  Location: [place];  Service: Radiology;  Laterality: N/A;   TEE WITHOUT CARDIOVERSION N/A 07/29/2021   Procedure: TRANSESOPHAGEAL ECHOCARDIOGRAM (TEE);  Surgeon: Sueanne Margarita, MD;  Location: [facility] ENDOSCOPY;  Service: Cardiovascular;  Laterality: N/A;   Patient Active Problem List   Diagnosis Date Noted   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  [place]) 07/24/2022   Type 1 diabetes mellitus with complications ([place]) 19/75/8832   Malaise 06/22/2022   Polycythemia 06/22/2022   Amputation below knee ([place]) 04/23/2022   PTSD (post-traumatic stress disorder) 04/23/2022   Generalized anxiety disorder 01/21/2022   Episode of unresponsiveness 12/15/2021   Acute respiratory failure with hypoxia (HCC) secondary to suspected aspiration pneumonia 12/15/2021   Renal mass 12/15/2021   Stage IV pressure ulcer of sacral region ([place]) 09/15/2021   Insomnia 54/98/2641   Chronic systolic CHF (congestive heart failure)  ([place]) 09/14/2021   Hypogonadism in male 09/14/2021   Chronic pain 09/14/2021   Pseudohyponatremia 09/13/2021   Normocytic anemia 09/13/2021   Discitis of thoracic region    Paroxysmal atrial flutter ([place]) 07/22/2021   Chronic diastolic (congestive) heart failure ([place]) 05/16/2021   Lumbar spondylosis 03/25/2021   H/O amputation of leg through tibia and fibula ([place]) 11/10/2019   Degeneration of lumbar intervertebral disc 11/10/2019   Hypercholesterolemia 11/10/2019   Left below-knee amputee ([place]) 10/30/2019   Dyslipidemia 10/30/2019   Type 1 diabetes mellitus with diabetic polyneuropathy, with long-term current use of insulin ([place]) 10/12/2019   Right below-elbow amputee 10/12/2019   Hypertensive disorder 09/25/2019   Urinary hesitancy 09/25/2019   Prosthesis adjustments 09/25/2019   Erectile dysfunction 09/25/2019   Palliative care status 09/25/2019   Pulmonary nodules 09/25/2019   Depressive disorder 09/25/2019    REFERRING DIAG: G89.4 (ICD-10-CM) - Chronic pain syndrome   Z89.512 Left Below Knee Amputation, Z89.511 history of right Below Knee Amputation.  THERAPY DIAG:  Unsteadiness on feet  Other abnormalities of gait and mobility  Muscle weakness (generalized)  Impaired functional mobility and activity tolerance  Other low back pain  Rationale for Evaluation and Treatment Rehabilitation  PERTINENT HISTORY:  DM2, CHF, HTN, chronic pain, bilateral TTAs  PRECAUTIONS: Fall  SUBJECTIVE:                                                                                                                                                                                      SUBJECTIVE STATEMENT:  MD debrided sacral wound which caused pain when Lidocaine wore off.  His back seems to be "popping"     PAIN:  Are you having pain? Are you having pain? Yes: NPRS scale: today 8/10 and in last week ranging 6-10/10 Pain location: middle low back Pain description: sharp Aggravating  factors: extension Relieving factors: sitting forward,   OBJECTIVE: (objective measures completed at initial evaluation unless otherwise dated)   OBJECTIVE:  COGNITION: Overall cognitive status: Within functional limits for tasks assessed   POSTURE: rounded shoulders, forward head, flexed trunk , and weight shift left   LOWER EXTREMITY ROM:  ROM P:passive  A:active Right eval Left eval  Hip flexion      Hip extension Standing A: -15* Standing A: -15*  Hip abduction      Hip adduction      Hip internal rotation      Hip external rotation      Knee flexion      Knee extension      Ankle dorsiflexion      Ankle plantarflexion      Ankle inversion      Ankle eversion       (Blank rows = not tested)   LOWER EXTREMITY MMT:   MMT Right eval Left eval  Hip flexion 4/5 4/5  Hip extension 3-/5 3-/5  Hip abduction 3/5 3/5  Hip adduction      Hip internal rotation      Hip external rotation      Knee flexion 4/5 4/5  Knee extension 4/5 4/5  Ankle dorsiflexion NA NA  Ankle plantarflexion NA NA  Ankle inversion NA NA  Ankle eversion NA NA  (Blank rows = not tested)   TRANSFERS: Sit to stand: SBA 22" w/c requiring armrests to RW for support for stabilization with poor technique.  Stand to sit: SBA requires RW support to 22" w/c requiring armrests with poor technique.    GAIT: Gait pattern: step to pattern, decreased step length- Right, decreased stance time- Left, Left hip hike, antalgic, trunk flexed, and wide BOS Distance walked: 5' with 90* turn right & left and 180* turn Assistive device utilized: Environmental consultant - 2 wheeled and TTA prosthesis Level of assistance: Min A esp with turns Comments: excessive BUE weight bearing on RW   FUNCTIONAL TESTs:  Berg Balance Scale: 10/56   [facility] PT Assessment - 07/06/22 1300                Standardized Balance Assessment    Standardized Balance Assessment Berg Balance Test          Berg Balance Test    Sit to Stand Needs  minimal aid to stand or to stabilize     Standing Unsupported Needs several tries to stand 30 seconds unsupported     Sitting with Back Unsupported but Feet Supported on Floor or Stool Able to sit safely and securely 2 minutes     Stand to Sit Sits independently, has uncontrolled descent     Transfers Able to transfer safely, definite need of hands     Standing Unsupported with Eyes Closed Needs help to keep from falling     Standing Unsupported with Feet Together Needs help to attain position and unable to hold for 15 seconds     From Standing, Reach Forward with Outstretched Arm Loses balance while trying/requires external support     From Standing Position, Pick up Object from Floor Unable to try/needs assist to keep balance     From Standing Position, Turn to Look Behind Over each Shoulder Needs assist to keep from losing balance and falling     Turn 360 Degrees Needs assistance while turning     Standing Unsupported, Alternately Place Feet on Step/Stool Needs assistance to keep from falling or unable to try     Standing Unsupported, One Foot in [employer] balance while stepping or standing     Standing on One Leg Unable to try or needs assist to prevent fall     Total Score 10     Berg comment: BERG  < 36 high risk  for falls (close to 100%) 46-51 moderate (>50%)   37-45 significant (>80%) 52-55 lower (> 25%)                   CURRENT PROSTHETIC WEAR ASSESSMENT: Patient is dependent with: skin check, residual limb care, prosthetic cleaning, ply sock cleaning, correct ply sock adjustment, proper wear schedule/adjustment, and proper weight-bearing schedule/adjustment Donning prosthesis: SBA / verbal cues Doffing prosthesis: SBA / verbal cues Prosthetic wear tolerance: liner & flexible suction socket for most of awake hours and wears prostheses maybe ~40% of awake hours,  7 days/week Prosthetic weight bearing tolerance: 5 minutes Edema: none noted Residual limb condition: bilateral  redness over patella, dry skin, normal color & temperature.  Prosthetic description: suction pin suspension with 37m silicon liner as interface & flexible inner socket, total contact socket, dynamic response Flex feet       TODAY'S TREATMENT:                                                                                                                             DATE:  08/17/2022: Prosthetic training with bilateral below the knee amputation prostheses: Sit to/from stand from wheelchair to forearm crutches with supervision and verbal cues Patient ambulated 250 feet with forearm crutches with supervision.  Standing with upright posture with posterior pelvis to counter for 3 min. Step up & down 4" step with sink & single forearm crutch support 5 reps ea LE. PT cued as HEP but needs one more session prior to trying outside of PT. Patient negotiated 12 degree ramp with forearm crutches with min assist and verbal cues. Patient negotiated 6 inch curb with forearm crutches with moderate assistance second person available for safety with verbal and demo cues for technique.   08/12/2022: Prosthetic training with bilateral below the knee amputation prostheses: Patient ambulated 250 feet with forearm crutches with supervision. Worked on wBest boybw cones 8' apart with verbal cues.  PT reviewed need to dry limb / liners with signs of sweating or q4-5  hours. PT educated on prostheses slippage and potential for skin issues.  PT demo & verbal cues on technique for neg curb. Pt neg 4" curb with forearm crutches leading ea LE 2 reps ea with minA. Seqeunce that seems to work best of pt is  ascending - step up one LE, then both crutches followed by other LE;  descending - both crutches to floor, toes over edge, move crutches further forward, step one foot down making sure to flex stance knee, then step 2nd LE down and move crutches as needed to catch balance. Pt verbalized better understanding.   Therapeutic  Exercise: Standing at door frame with single UE & BUEs reaching overhead for upright stretch. 2 reps ea UE & BUEs 2 deep breath hold.  In //bars step up & step down working on stance LE knee flexion to lower contralateral foot to floor. 4" step 10 reps ea LE. 6" step 5  reps ea LE.   08/10/2022: Prosthetic training with bilateral below the knee amputation prostheses: PT reviewed 4-point sequencing seated before beginning gait.  Patient ambulated 150 feet with forearm crutches with min guard and verbal cues.  Patient had to stop to change directions during gait. PT demonstrated how to negotiate around obstacles and maintaining pattern.  Patient negotiated obstacles 10 feet apart and around furniture with the ability to maintain fluency of gait pattern except for a 90 degree turn he did require stopping again. PT demo and verbal cues on negotiating a curb with forearm crutches.  Patient able to to ascend a 6 inch curb with mod assist and a second person nearby for safety.  When descending the 6 inch curb patient would not flex his stance lower extremity causing a balance of falls and requiring plus to assist to prevent a fall. Patient again having trouble today with low blood sugar during therapy session.  Patient given Coke and food.  This is the second session the patient has had trouble with blood sugar with activity.  PT recommended discussing with his PCP and keeping a log of food, medications and blood sugar levels.  Patient verbalized understanding   HOME EXERCISE PROGRAM:   Access Code: [email or web address] URL: [email or web address] Date: 07/29/2022 Prepared by: Jamey Reas  Exercises - standing on floor eyes open head movements  - 1 x daily - 7 x weekly - 1 sets - 5-10 reps - 5 seconds hold - standing on floor head movements eyes closed  - 1 x daily - 7 x weekly - 1 sets - 5-10 reps - 2 seconds hold - stand on foam eyes open head movements  - 1 x daily - 7 x weekly - 1 sets - 5-10  reps - 5 seconds hold  Access Code: [email or web address] URL: [email or web address] Date: 08/03/2022 Prepared by: Jamey Reas  Exercises - Sit to Stand with Counter Support  - 2-3 x daily - 7 x weekly - 1 sets - 10 reps - 5 seconds hold - Seated Hamstring Stretch with Strap  - 2-3 x daily - 7 x weekly - 1 sets - 2-3 reps - 20-30 seconds hold - Alternating Punch with Resistance  - 1 x daily - 5 x weekly - 1 sets - 10 reps - 5 seconds hold - Standing Single Arm Shoulder Flexion with Posterior Anchored Resistance  - 1 x daily - 5 x weekly - 1 sets - 10 reps - 5 seconds hold - Standing Scapular Protraction with Resistance  - 1 x daily - 7 x weekly - 1 sets - 10 reps - 5 seconds hold - Standing alternate rows with resistance  - 1 x daily - 7 x weekly - 1 sets - 10 reps - 5 seconds hold - Single Arm Shoulder Extension with Anchored Resistance  - 1 x daily - 5 x weekly - 1 sets - 10 reps - 5 seconds hold - Standing Row with Anchored Resistance  - 1 x daily - 7 x weekly - 1 sets - 10 reps - 5 seconds hold - Alternating elbow flexion with resistance  - 1 x daily - 7 x weekly - 1 sets - 10 reps - 5 seconds hold - Standing Bicep Curls with Resistance  - 1 x daily - 7 x weekly - 1 sets - 10 reps - 5 seconds hold   Do each exercise 1-2  times per day Do each exercise 5-10 repetitions Hold each exercise for 2 seconds to feel your  location  AT [place].  Try to find this position when standing still for activities.   USE TAPE ON FLOOR TO MARK THE MIDLINE POSITION which is even with middle of sink.  You also should try to feel with your limb pressure in socket.  You are trying to feel with limb what you used to feel with the bottom of your foot.  Side to Side Shift: Moving your hips only (not shoulders): move weight onto your left leg, HOLD/FEEL pressure in socket.  Move back to equal weight on each leg, HOLD/FEEL pressure in  socket. Move weight onto your right leg, HOLD/FEEL pressure in socket. Move back to equal weight on each leg, HOLD/FEEL pressure in socket. Repeat.  Start with both hands on sink, progress to right hand only hold, then left hand only hold  Front to Back Shift: Moving your hips only (not shoulders): move your weight forward onto your toes, HOLD/FEEL pressure in socket. Move your weight back to equal Flat Foot on both legs, HOLD/FEEL  pressure in socket. Move your weight back onto your heels, HOLD/FEEL  pressure in socket. Move your weight back to equal on both legs, HOLD/FEEL  pressure in socket. Repeat.  Start with both hands on sink, progress to right hand only hold, then left hand only hold  Moving Cones / Cups: With equal weight on each leg: Hold on with one hand the first time, then progress to no hand supports. Move cups from one side of sink to the other. Place cups ~2" out of your reach, progress to 10" beyond reach.  Place one hand in middle of sink and reach with other hand. Do both arms.   Overhead/Upward Reaching: alternated reaching up to top cabinets or ceiling if no cabinets present. Keep equal weight on each leg. Start with one hand support on counter while other hand reaches and progress to no hand support with reaching.  ace one hand in middle of sink and reach with other hand. Do both arms.   5.   Looking Over Shoulders: With equal weight on each leg: alternate turning to look over your shoulders with one hand support on counter as needed.  Start with head motions only to look in front of shoulder, then even with shoulder and progress to looking behind you. To look to side, move head /eyes, then shoulder on side looking pulls back, shift more weight to side looking and pull hip back. Place one hand in middle of sink and let go with other hand so your shoulder can pull back. Switch hands to look other way.   6.  Alternating Stepping or tapping toes into lower cabinet:  Move items under cabinet  out of your way. Shift your hips/pelvis so weight on right leg & Tighten muscles in hip on right side.  SLOWLY step left leg so front of foot is in cabinet. Then step back to floor with right hip tight.  Repeat with other leg.     ASSESSMENT:   CLINICAL IMPRESSION: Patient has significantly improved his mobility with skilled PT instruction for gait and balance with bilateral below-knee prostheses.  Patient continues to benefit from skilled PT.  Patient has limitations due to back pain, weakness, balance deficits and activity tolerance deficits   OBJECTIVE IMPAIRMENTS: Abnormal gait, decreased activity tolerance, decreased balance, decreased knowledge of use of DME, decreased mobility, decreased ROM, decreased strength, impaired flexibility, postural dysfunction, prosthetic dependency ,  and pain.    ACTIVITY LIMITATIONS: carrying, lifting, bending, sitting, standing, squatting, stairs, transfers, and locomotion level   PARTICIPATION LIMITATIONS: meal prep, cleaning, driving, and community activity   PERSONAL FACTORS: Fitness, Time since onset of injury/illness/exacerbation, and 3+ comorbidities: see PMH  are also affecting patient's functional outcome.    REHAB POTENTIAL: Good   CLINICAL DECISION MAKING: Evolving/moderate complexity   EVALUATION COMPLEXITY: Moderate     GOALS: Goals reviewed with patient? Yes   SHORT TERM GOALS: Target date: 09/03/2022   Patient verbalizes how to adjust ply fit with limb volume changes. Baseline: SEE OBJECTIVE DATA Goal status: ongoing 08/17/2022 2.  Patient verbalizes understanding of updated HEP.  Baseline: SEE OBJECTIVE DATA Goal status: ongoing 08/17/2022   3.  Patient able to transfer sit to stand chairs without armrests & stabilize without external support. Baseline: SEE OBJECTIVE DATA Goal status:  ongoing 08/17/2022   4. Patient ambulates 200' with crutches & prosthesis with supervision. Baseline: SEE OBJECTIVE DATA Goal status:   ongoing 08/17/2022   5. Patient negotiates ramps & curbs with crutches & prosthesis with supervision Baseline: SEE OBJECTIVE DATA Goal status:  ongoing 08/17/2022   LONG TERM GOALS: Target date: 10/01/2022   Patient demonstrates & verbalized understanding of prosthetic care to enable safe utilization of prosthesis. Baseline: SEE OBJECTIVE DATA Goal status: ongoing 08/17/2022   Patient tolerates prostheses wear >90% of awake hours without skin or limb pain issues. Baseline: SEE OBJECTIVE DATA Goal status: ongoing 08/17/2022   Berg Balance >/= 45/56 to indicate lower fall risk Baseline: SEE OBJECTIVE DATA Goal status: ongoing 08/17/2022   Patient ambulates >300' with prostheses and cane or less independently Baseline: SEE OBJECTIVE DATA Goal status: ongoing 08/17/2022   Patient negotiates ramps, curbs & stairs with single rail with prostheses and cane or less independently. Baseline: SEE OBJECTIVE DATA Goal status: ongoing 08/17/2022   6.   Patient verbalizes & demonstrates understanding of how to properly use fitness equipment to return to gym. Baseline: SEE OBJECTIVE DATA Goal status: ongoing 08/17/2022   PLAN:   PT FREQUENCY: 2x/week   PT DURATION: 90 days / 13 weeks   PLANNED INTERVENTIONS: Therapeutic exercises, Therapeutic activity, Neuromuscular re-education, Balance training, Gait training, Patient/Family education, Self Care, Stair training, Prosthetic training, DME instructions, Electrical stimulation, Cryotherapy, Moist heat, and physical performance testing.   PLAN FOR NEXT SESSION:  check [employer] recert. continue step at sink for HEP. Prosthetic gait with forearm crutches including ramps and curbs  Referring diagnosis? Chronic pain syndrome   Z89.512 Left Below Knee Amputation, Z89.511 history of right Below Knee Amputation. Treatment diagnosis? (if different than referring diagnosis)  iness on feet R26.81   Change Dx               2.     Other abnormalities of  gait and mobility R26.89   Change Dx        3.     Muscle weakness (generalized) M62.81   Change Dx        4.     Impaired functional mobility and activity tolerance Z74.09   Change Dx        5.     Other low back pain M54.59   Change Dx                    What was this (referring dx) caused by? _0  Surgery _1  Fall _2  Ongoing issue _3  Arthritis _4  Other: ____________  Laterality: _5  Rt _6  Lt _7  Both  Check all possible CPT codes:  *CHOOSE 10 OR LESS*    _0  97110 (Therapeutic Exercise)  _1  92507 (SLP Treatment)  _2  51460 (Neuro Re-ed)   _3  92526 (Swallowing Treatment)   _4  47998 (Gait Training)   _5  72158 (Cognitive Training, 1st 15 minutes) _6  97140 (Manual Therapy)   _7  97130 (Cognitive Training, each add'l 15 minutes)  _8  97164 (Re-evaluation)                              _9  Other, List CPT Code ____________  _10  72761 (Therapeutic Activities)     _11  84859 (Self Care)   _12  All codes above (97110 - 97535)  _13  97012 (Mechanical Traction)  _14  97014 (E-stim Unattended)  _15  97032 (E-stim manual)  _16  97033 (Ionto)  _17  97035 (Ultrasound) _18  97750 (Physical Performance Training) _19  27639 (Aquatic Therapy) _20  97016 (Vasopneumatic Device) _21  43200 (Paraffin) _22  97034 (Contrast Bath) _23  97597 (Wound Care 1st 20 sq cm) _24  97598 (Wound Care each add'l 20 sq cm) _25  97760 (Orthotic Fabrication, Fitting, Training Initial) _26  [id number] (Prosthetic Management and Training Initial) _27  [id number] (Orthotic or Prosthetic Training/ Modification Subsequent)   Jamey Reas, PT, DPT 08/17/2022, 5:02 PM

## 2022-08-17 NOTE — Telephone Encounter (Signed)
Pt has not been in since 10/2021 but can we still see if [facility] will accept rx as requested below.

## 2022-08-17 NOTE — Telephone Encounter (Signed)
Pt called in requesting new script to be faxed over to [facility] for new sleeves and liners... Pt requesting callback.Marland KitchenMarland Kitchen

## 2022-08-19 ENCOUNTER — Ambulatory Visit (INDEPENDENT_AMBULATORY_CARE_PROVIDER_SITE_OTHER): Payer: Medicare HMO | Admitting: Physical Therapy

## 2022-08-19 ENCOUNTER — Encounter: Payer: Self-pay | Admitting: Physical Therapy

## 2022-08-19 ENCOUNTER — Telehealth: Payer: Self-pay | Admitting: Orthopedic Surgery

## 2022-08-19 DIAGNOSIS — M5459 Other low back pain: Secondary | ICD-10-CM | POA: Diagnosis not present

## 2022-08-19 DIAGNOSIS — Z7409 Other reduced mobility: Secondary | ICD-10-CM | POA: Diagnosis not present

## 2022-08-19 DIAGNOSIS — M6281 Muscle weakness (generalized): Secondary | ICD-10-CM

## 2022-08-19 DIAGNOSIS — R2681 Unsteadiness on feet: Secondary | ICD-10-CM

## 2022-08-19 DIAGNOSIS — R2689 Other abnormalities of gait and mobility: Secondary | ICD-10-CM

## 2022-08-19 NOTE — Therapy (Signed)
OUTPATIENT PHYSICAL THERAPY TREATMENT NOTE   Patient Name: [name] MRN: [id number] DOB:06-12-1966, 56 y.o., male Today's Date: 08/19/2022  PCP: Loralee Pacas, MD REFERRING PROVIDER: Loralee Pacas, MD   END OF SESSION:   PT End of Session - 08/19/22 1256     Visit Number 13    Number of Visits 26    Date for PT Re-Evaluation 10/01/22    Authorization Type [employer]    Authorization Time Period [employer] NO COPAY DED MET AND OOP MET Approved Authorization #[id number]  Tracking #[id number] pt visits 12//-10/01/22    Authorization - Visit Number 1    Authorization - Number of Visits 12    Progress Note Due on Visit 68    PT Start Time 1300    PT Stop Time 4765    PT Time Calculation (min) 45 min    Equipment Utilized During Treatment Gait belt    Activity Tolerance Patient tolerated treatment well;Patient limited by pain    Behavior During Therapy [facility] for tasks assessed/performed                    Past Medical History:  Diagnosis Date   Acquired complex renal cyst 09/25/2019   AKI (acute kidney injury) ([place]) 07/22/2021   Cancer ([place])    renal   Chest pain 46/50/3546   Complication of anesthesia    Decubitus ulcer of sacral area 12/15/2021   Depression    Diabetes mellitus without complication ([place])    Diabetic infection of left foot ([place]) 04/17/2021   Episode of unresponsiveness 12/15/2021   Gas gangrene of foot ([place]) 07/22/2021   Healthcare maintenance 09/25/2019   Hematuria 09/25/2019   History of kidney stones    Lactic acidosis 12/15/2021   Leukocytosis 09/13/2021   Osteomyelitis ([place])    Sacral pressure injury of skin 07/27/2021   Septic shock ([place]) 07/22/2021   Severe sepsis with acute organ dysfunction ([place]) 07/22/2021   Shortness of breath 05/16/2021   Stage 3a chronic kidney disease ([place]) 56/81/2751   Systolic dysfunction    Type 2 diabetes mellitus 10/12/2019   Past Surgical History:  Procedure Laterality Date   AMPUTATION Left  07/23/2021   Procedure: LEFT BELOW KNEE AMPUTATION;  Surgeon: [name], MD;  Location: [place];  Service: Orthopedics;  Laterality: Left;   BELOW KNEE LEG AMPUTATION Right    BUBBLE STUDY  07/29/2021   Procedure: BUBBLE STUDY;  Surgeon: Sueanne Margarita, MD;  Location: [place];  Service: Cardiovascular;;   CARDIOVERSION N/A 07/29/2021   Procedure: CARDIOVERSION;  Surgeon: Sueanne Margarita, MD;  Location: [facility] ENDOSCOPY;  Service: Cardiovascular;  Laterality: N/A;   RADIOLOGY WITH ANESTHESIA N/A 08/05/2021   Procedure: MRI LUMBAR WITH AND WITHOUT; THORASIC SPINE WITH AND WITHOUT WITH ANESTHESIA;  Surgeon: Radiologist, Medication, MD;  Location: [place];  Service: Radiology;  Laterality: N/A;   RADIOLOGY WITH ANESTHESIA N/A 08/07/2021   Procedure: MRI WITH LUMBER WITH AND WITHOUT CONTRAST,THORACIC WITH AND WITHOUT CONTRAST;  Surgeon: Radiologist, Medication, MD;  Location: [place];  Service: Radiology;  Laterality: N/A;   RADIOLOGY WITH ANESTHESIA N/A 09/13/2021   Procedure: MRI WITH ANESTHESIA;  Surgeon: Luanne Bras, MD;  Location: [place];  Service: Radiology;  Laterality: N/A;   TEE WITHOUT CARDIOVERSION N/A 07/29/2021   Procedure: TRANSESOPHAGEAL ECHOCARDIOGRAM (TEE);  Surgeon: Sueanne Margarita, MD;  Location[facility] ENDOSCOPY;  Service: Cardiovascular;  Laterality: N/A;   Patient Active Problem List   Diagnosis Date Noted   Brittle  diabetes mellitus ([place]) 07/24/2022   Type 1 diabetes mellitus with complications ([place]) 61/44/3154   Malaise 06/22/2022   Polycythemia 06/22/2022   Amputation below knee ([place]) 04/23/2022   PTSD (post-traumatic stress disorder) 04/23/2022   Generalized anxiety disorder 01/21/2022   Episode of unresponsiveness 12/15/2021   Acute respiratory failure with hypoxia (HCC) secondary to suspected aspiration pneumonia 12/15/2021   Renal mass 12/15/2021   Stage IV pressure ulcer of sacral region ([place]) 09/15/2021   Insomnia 00/86/7619   Chronic systolic CHF (congestive  heart failure) ([place]) 09/14/2021   Hypogonadism in male 09/14/2021   Chronic pain 09/14/2021   Pseudohyponatremia 09/13/2021   Normocytic anemia 09/13/2021   Discitis of thoracic region    Paroxysmal atrial flutter ([place]) 07/22/2021   Chronic diastolic (congestive) heart failure ([place]) 05/16/2021   Lumbar spondylosis 03/25/2021   H/O amputation of leg through tibia and fibula ([place]) 11/10/2019   Degeneration of lumbar intervertebral disc 11/10/2019   Hypercholesterolemia 11/10/2019   Left below-knee amputee ([place]) 10/30/2019   Dyslipidemia 10/30/2019   Type 1 diabetes mellitus with diabetic polyneuropathy, with long-term current use of insulin ([place]) 10/12/2019   Right below-elbow amputee 10/12/2019   Hypertensive disorder 09/25/2019   Urinary hesitancy 09/25/2019   Prosthesis adjustments 09/25/2019   Erectile dysfunction 09/25/2019   Palliative care status 09/25/2019   Pulmonary nodules 09/25/2019   Depressive disorder 09/25/2019    REFERRING DIAG: G89.4 (ICD-10-CM) - Chronic pain syndrome   Z89.512 Left Below Knee Amputation, Z89.511 history of right Below Knee Amputation.  THERAPY DIAG:  Other abnormalities of gait and mobility  Unsteadiness on feet  Muscle weakness (generalized)  Impaired functional mobility and activity tolerance  Other low back pain  Rationale for Evaluation and Treatment Rehabilitation  PERTINENT HISTORY:  DM2, CHF, HTN, chronic pain, bilateral TTAs  PRECAUTIONS: Fall  SUBJECTIVE:                                                                                                                                                                                      SUBJECTIVE STATEMENT:  He needs prescription for liners & sleeves for coverage.    PAIN:  Are you having pain? Are you having pain? Yes: NPRS scale: today 8/10 and in last week ranging 6-10/10 Pain location: middle low back Pain description: sharp Aggravating factors: extension Relieving  factors: sitting forward,   OBJECTIVE: (objective measures completed at initial evaluation unless otherwise dated)   OBJECTIVE:  COGNITION: Overall cognitive status: Within functional limits for tasks assessed   POSTURE: rounded shoulders, forward head, flexed trunk , and weight shift left   LOWER EXTREMITY ROM:   ROM P:passive  A:active Right eval Left  eval  Hip flexion      Hip extension Standing A: -15* Standing A: -15*  Hip abduction      Hip adduction      Hip internal rotation      Hip external rotation      Knee flexion      Knee extension      Ankle dorsiflexion      Ankle plantarflexion      Ankle inversion      Ankle eversion       (Blank rows = not tested)   LOWER EXTREMITY MMT:   MMT Right eval Left eval  Hip flexion 4/5 4/5  Hip extension 3-/5 3-/5  Hip abduction 3/5 3/5  Hip adduction      Hip internal rotation      Hip external rotation      Knee flexion 4/5 4/5  Knee extension 4/5 4/5  Ankle dorsiflexion NA NA  Ankle plantarflexion NA NA  Ankle inversion NA NA  Ankle eversion NA NA  (Blank rows = not tested)   TRANSFERS: Sit to stand: SBA 22" w/c requiring armrests to RW for support for stabilization with poor technique.  Stand to sit: SBA requires RW support to 22" w/c requiring armrests with poor technique.    GAIT: Gait pattern: step to pattern, decreased step length- Right, decreased stance time- Left, Left hip hike, antalgic, trunk flexed, and wide BOS Distance walked: 24' with 90* turn right & left and 180* turn Assistive device utilized: Environmental consultant - 2 wheeled and TTA prosthesis Level of assistance: Min A esp with turns Comments: excessive BUE weight bearing on RW   FUNCTIONAL TESTs:  Berg Balance Scale: 10/56   [facility] PT Assessment - 07/06/22 1300                Standardized Balance Assessment    Standardized Balance Assessment Berg Balance Test          Berg Balance Test    Sit to Stand Needs minimal aid to stand or to  stabilize     Standing Unsupported Needs several tries to stand 30 seconds unsupported     Sitting with Back Unsupported but Feet Supported on Floor or Stool Able to sit safely and securely 2 minutes     Stand to Sit Sits independently, has uncontrolled descent     Transfers Able to transfer safely, definite need of hands     Standing Unsupported with Eyes Closed Needs help to keep from falling     Standing Unsupported with Feet Together Needs help to attain position and unable to hold for 15 seconds     From Standing, Reach Forward with Outstretched Arm Loses balance while trying/requires external support     From Standing Position, Pick up Object from Floor Unable to try/needs assist to keep balance     From Standing Position, Turn to Look Behind Over each Shoulder Needs assist to keep from losing balance and falling     Turn 360 Degrees Needs assistance while turning     Standing Unsupported, Alternately Place Feet on Step/Stool Needs assistance to keep from falling or unable to try     Standing Unsupported, One Foot in [employer] balance while stepping or standing     Standing on One Leg Unable to try or needs assist to prevent fall     Total Score 10     Berg comment: BERG  < 36 high risk for falls (close to 100%) 46-51 moderate (>  50%)   37-45 significant (>80%) 52-55 lower (> 25%)                   CURRENT PROSTHETIC WEAR ASSESSMENT: Patient is dependent with: skin check, residual limb care, prosthetic cleaning, ply sock cleaning, correct ply sock adjustment, proper wear schedule/adjustment, and proper weight-bearing schedule/adjustment Donning prosthesis: SBA / verbal cues Doffing prosthesis: SBA / verbal cues Prosthetic wear tolerance: liner & flexible suction socket for most of awake hours and wears prostheses maybe ~40% of awake hours,  7 days/week Prosthetic weight bearing tolerance: 5 minutes Edema: none noted Residual limb condition: bilateral redness over patella, dry  skin, normal color & temperature.  Prosthetic description: suction pin suspension with 28m silicon liner as interface & flexible inner socket, total contact socket, dynamic response Flex feet       TODAY'S TREATMENT:                                                                                                                             DATE:  08/19/2022: Prosthetic training with bilateral below the knee amputation prostheses: Sit to/from stand from wheelchair to forearm crutches with supervision and verbal cues. And from chairs without armrests using UEs with minA. Patient ambulated 150 feet with forearm crutches with supervision.  Step up & down 4" step with sink & single forearm crutch support 2 reps ea LE. PT cued as HEP including turning around switching single crutch to other UE using counter. Pt return demo & verbalized understanding.  Patient negotiated 12 degree ramp with forearm crutches with min assist and verbal cues. Patient negotiated 4 inch curb with forearm crutches with moderate assistance second person available for safety with verbal and demo cues for technique. PT reviewed walking program short walk room to room with RW so can release for standing ADLs, long walk (max tolerable) with RW for support and medium (distance between short & long) with forearm crutches with supervision. ONLY use forearm crutches on level surfaces. Pt verbalized understanding.   Pt's back pain & trunk weakness are limiting his mobility. PT discussed exercises prone & quadruped but he reports unable to get in or out of this position including prone.   08/17/2022: Prosthetic training with bilateral below the knee amputation prostheses: Sit to/from stand from wheelchair to forearm crutches with supervision and verbal cues Patient ambulated 250 feet with forearm crutches with supervision.  Standing with upright posture with posterior pelvis to counter for 3 min. Step up & down 4" step with sink &  single forearm crutch support 5 reps ea LE. PT cued as HEP but needs one more session prior to trying outside of PT. Patient negotiated 12 degree ramp with forearm crutches with min assist and verbal cues. Patient negotiated 6 inch curb with forearm crutches with moderate assistance second person available for safety with verbal and demo cues for technique.   08/12/2022: Prosthetic training with bilateral below the knee amputation prostheses: Patient  ambulated 250 feet with forearm crutches with supervision. Worked on Best boy bw cones 8' apart with verbal cues.  PT reviewed need to dry limb / liners with signs of sweating or q4-5  hours. PT educated on prostheses slippage and potential for skin issues.  PT demo & verbal cues on technique for neg curb. Pt neg 4" curb with forearm crutches leading ea LE 2 reps ea with minA. Seqeunce that seems to work best of pt is  ascending - step up one LE, then both crutches followed by other LE;  descending - both crutches to floor, toes over edge, move crutches further forward, step one foot down making sure to flex stance knee, then step 2nd LE down and move crutches as needed to catch balance. Pt verbalized better understanding.   Therapeutic Exercise: Standing at door frame with single UE & BUEs reaching overhead for upright stretch. 2 reps ea UE & BUEs 2 deep breath hold.  In //bars step up & step down working on stance LE knee flexion to lower contralateral foot to floor. 4" step 10 reps ea LE. 6" step 5 reps ea LE.    HOME EXERCISE PROGRAM:   Access Code: [email or web address] URL: [email or web address] Date: 07/29/2022 Prepared by: Jamey Reas  Exercises - standing on floor eyes open head movements  - 1 x daily - 7 x weekly - 1 sets - 5-10 reps - 5 seconds hold - standing on floor head movements eyes closed  - 1 x daily - 7 x weekly - 1 sets - 5-10 reps - 2 seconds hold - stand on foam eyes open head movements  - 1 x daily - 7 x weekly - 1  sets - 5-10 reps - 5 seconds hold  Access Code: [email or web address] URL: [email or web address] Date: 08/03/2022 Prepared by: Jamey Reas  Exercises - Sit to Stand with Counter Support  - 2-3 x daily - 7 x weekly - 1 sets - 10 reps - 5 seconds hold - Seated Hamstring Stretch with Strap  - 2-3 x daily - 7 x weekly - 1 sets - 2-3 reps - 20-30 seconds hold - Alternating Punch with Resistance  - 1 x daily - 5 x weekly - 1 sets - 10 reps - 5 seconds hold - Standing Single Arm Shoulder Flexion with Posterior Anchored Resistance  - 1 x daily - 5 x weekly - 1 sets - 10 reps - 5 seconds hold - Standing Scapular Protraction with Resistance  - 1 x daily - 7 x weekly - 1 sets - 10 reps - 5 seconds hold - Standing alternate rows with resistance  - 1 x daily - 7 x weekly - 1 sets - 10 reps - 5 seconds hold - Single Arm Shoulder Extension with Anchored Resistance  - 1 x daily - 5 x weekly - 1 sets - 10 reps - 5 seconds hold - Standing Row with Anchored Resistance  - 1 x daily - 7 x weekly - 1 sets - 10 reps - 5 seconds hold - Alternating elbow flexion with resistance  - 1 x daily - 7 x weekly - 1 sets - 10 reps - 5 seconds hold - Standing Bicep Curls with Resistance  - 1 x daily - 7 x weekly - 1 sets - 10 reps - 5 seconds hold   Do each exercise 1-2  times per day Do each exercise 5-10 repetitions Hold each exercise for 2 seconds to feel your location  AT [name]  AND PLACE FEET EQUAL DISTANCE FROM THE MIDLINE.  Try to find this position when standing still for activities.   USE TAPE ON FLOOR TO MARK THE MIDLINE POSITION which is even with middle of sink.  You also should try to feel with your limb pressure in socket.  You are trying to feel with limb what you used to feel with the bottom of your foot.  Side to Side Shift: Moving your hips only (not shoulders): move weight onto your left leg, HOLD/FEEL pressure in socket.  Move back to equal weight on each leg, HOLD/FEEL  pressure in socket. Move weight onto your right leg, HOLD/FEEL pressure in socket. Move back to equal weight on each leg, HOLD/FEEL pressure in socket. Repeat.  Start with both hands on sink, progress to right hand only hold, then left hand only hold  Front to Back Shift: Moving your hips only (not shoulders): move your weight forward onto your toes, HOLD/FEEL pressure in socket. Move your weight back to equal Flat Foot on both legs, HOLD/FEEL  pressure in socket. Move your weight back onto your heels, HOLD/FEEL  pressure in socket. Move your weight back to equal on both legs, HOLD/FEEL  pressure in socket. Repeat.  Start with both hands on sink, progress to right hand only hold, then left hand only hold  Moving Cones / Cups: With equal weight on each leg: Hold on with one hand the first time, then progress to no hand supports. Move cups from one side of sink to the other. Place cups ~2" out of your reach, progress to 10" beyond reach.  Place one hand in middle of sink and reach with other hand. Do both arms.   Overhead/Upward Reaching: alternated reaching up to top cabinets or ceiling if no cabinets present. Keep equal weight on each leg. Start with one hand support on counter while other hand reaches and progress to no hand support with reaching.  ace one hand in middle of sink and reach with other hand. Do both arms.   5.   Looking Over Shoulders: With equal weight on each leg: alternate turning to look over your shoulders with one hand support on counter as needed.  Start with head motions only to look in front of shoulder, then even with shoulder and progress to looking behind you. To look to side, move head /eyes, then shoulder on side looking pulls back, shift more weight to side looking and pull hip back. Place one hand in middle of sink and let go with other hand so your shoulder can pull back. Switch hands to look other way.   6.  Alternating Stepping or tapping toes into lower cabinet:  Move items  under cabinet out of your way. Shift your hips/pelvis so weight on right leg & Tighten muscles in hip on right side.  SLOWLY step left leg so front of foot is in cabinet. Then step back to floor with right hip tight.  Repeat with other leg.     ASSESSMENT:   CLINICAL IMPRESSION: Patient appears safe for prosthetic gait with forearm crutches on level surfaces only with supervision of family or friend.   Patient has limitations due to back pain, weakness, balance deficits and activity tolerance deficits   OBJECTIVE IMPAIRMENTS: Abnormal gait, decreased activity tolerance, decreased balance, decreased knowledge of use of DME, decreased mobility, decreased ROM, decreased strength, impaired flexibility, postural dysfunction, prosthetic dependency , and pain.    ACTIVITY LIMITATIONS: carrying, lifting, bending, sitting, standing, squatting, stairs,  transfers, and locomotion level   PARTICIPATION LIMITATIONS: meal prep, cleaning, driving, and community activity   PERSONAL FACTORS: Fitness, Time since onset of injury/illness/exacerbation, and 3+ comorbidities: see PMH  are also affecting patient's functional outcome.    REHAB POTENTIAL: Good   CLINICAL DECISION MAKING: Evolving/moderate complexity   EVALUATION COMPLEXITY: Moderate     GOALS: Goals reviewed with patient? Yes   SHORT TERM GOALS: Target date: 09/03/2022   Patient verbalizes how to adjust ply fit with limb volume changes. Baseline: SEE OBJECTIVE DATA Goal status: ongoing 08/17/2022 2.  Patient verbalizes understanding of updated HEP.  Baseline: SEE OBJECTIVE DATA Goal status: ongoing 08/17/2022   3.  Patient able to transfer sit to stand chairs without armrests & stabilize without external support. Baseline: SEE OBJECTIVE DATA Goal status:  ongoing 08/17/2022   4. Patient ambulates 200' with crutches & prosthesis with supervision. Baseline: SEE OBJECTIVE DATA Goal status:  ongoing 08/17/2022   5. Patient negotiates  ramps & curbs with crutches & prosthesis with supervision Baseline: SEE OBJECTIVE DATA Goal status:  ongoing 08/17/2022   LONG TERM GOALS: Target date: 10/01/2022   Patient demonstrates & verbalized understanding of prosthetic care to enable safe utilization of prosthesis. Baseline: SEE OBJECTIVE DATA Goal status: ongoing 08/17/2022   Patient tolerates prostheses wear >90% of awake hours without skin or limb pain issues. Baseline: SEE OBJECTIVE DATA Goal status: ongoing 08/17/2022   Berg Balance >/= 45/56 to indicate lower fall risk Baseline: SEE OBJECTIVE DATA Goal status: ongoing 08/17/2022   Patient ambulates >300' with prostheses and cane or less independently Baseline: SEE OBJECTIVE DATA Goal status: ongoing 08/17/2022   Patient negotiates ramps, curbs & stairs with single rail with prostheses and cane or less independently. Baseline: SEE OBJECTIVE DATA Goal status: ongoing 08/17/2022   6.   Patient verbalizes & demonstrates understanding of how to properly use fitness equipment to return to gym. Baseline: SEE OBJECTIVE DATA Goal status: ongoing 08/17/2022   PLAN:   PT FREQUENCY: 2x/week   PT DURATION: 90 days / 13 weeks   PLANNED INTERVENTIONS: Therapeutic exercises, Therapeutic activity, Neuromuscular re-education, Balance training, Gait training, Patient/Family education, Self Care, Stair training, Prosthetic training, DME instructions, Electrical stimulation, Cryotherapy, Moist heat, and physical performance testing.   PLAN FOR NEXT SESSION:  Prosthetic gait with forearm crutches including ramps and curbs, try lateral step up on 4" block at sink.    Jamey Reas, PT, DPT 08/19/2022, 1:55 PM

## 2022-08-19 NOTE — Telephone Encounter (Signed)
Patient states he need an order for the rx for both legs and also the liner sent to [facility]. He states he got one sent but not the other..please advise..[phone]( call patient when done)

## 2022-08-24 ENCOUNTER — Encounter (HOSPITAL_BASED_OUTPATIENT_CLINIC_OR_DEPARTMENT_OTHER): Payer: Medicare HMO | Admitting: Internal Medicine

## 2022-08-24 ENCOUNTER — Encounter: Payer: Medicare HMO | Admitting: Physical Therapy

## 2022-08-24 DIAGNOSIS — Z89512 Acquired absence of left leg below knee: Secondary | ICD-10-CM | POA: Diagnosis not present

## 2022-08-24 DIAGNOSIS — Z89511 Acquired absence of right leg below knee: Secondary | ICD-10-CM | POA: Diagnosis not present

## 2022-08-24 DIAGNOSIS — E1151 Type 2 diabetes mellitus with diabetic peripheral angiopathy without gangrene: Secondary | ICD-10-CM | POA: Diagnosis not present

## 2022-08-24 DIAGNOSIS — G8929 Other chronic pain: Secondary | ICD-10-CM | POA: Diagnosis not present

## 2022-08-24 DIAGNOSIS — E11622 Type 2 diabetes mellitus with other skin ulcer: Secondary | ICD-10-CM | POA: Diagnosis not present

## 2022-08-24 DIAGNOSIS — L89154 Pressure ulcer of sacral region, stage 4: Secondary | ICD-10-CM | POA: Diagnosis not present

## 2022-08-24 DIAGNOSIS — I110 Hypertensive heart disease with heart failure: Secondary | ICD-10-CM | POA: Diagnosis not present

## 2022-08-24 DIAGNOSIS — I509 Heart failure, unspecified: Secondary | ICD-10-CM | POA: Diagnosis not present

## 2022-08-24 NOTE — Therapy (Incomplete)
OUTPATIENT PHYSICAL THERAPY TREATMENT NOTE   Patient Name: [name] MRN: [id number] DOB:[birth year], 56 y.o., male Today's Date: 08/19/2022  PCP: Loralee Pacas, MD REFERRING PROVIDER: Loralee Pacas, MD   END OF SESSION:   PT End of Session - 08/19/22 1256     Visit Number 13    Number of Visits 26    Date for PT Re-Evaluation 10/01/22    Authorization Type [employer]    Authorization Time Period [employer] NO COPAY DED MET AND OOP MET Approved Authorization #[id number]  Tracking #[id number] pt visits 12//-10/01/22    Authorization - Visit Number 1    Authorization - Number of Visits 12    Progress Note Due on Visit 27    PT Start Time 1300    PT Stop Time 4742    PT Time Calculation (min) 45 min    Equipment Utilized During Treatment Gait belt    Activity Tolerance Patient tolerated treatment well;Patient limited by pain    Behavior During Therapy [facility] for tasks assessed/performed                    Past Medical History:  Diagnosis Date   Acquired complex renal cyst 09/25/2019   AKI (acute kidney injury) ([place]) 07/22/2021   Cancer ([place])    renal   Chest pain 59/56/3875   Complication of anesthesia    Decubitus ulcer of sacral area 12/15/2021   Depression    Diabetes mellitus without complication ([place])    Diabetic infection of left foot ([place]) 04/17/2021   Episode of unresponsiveness 12/15/2021   Gas gangrene of foot ([place]) 07/22/2021   Healthcare maintenance 09/25/2019   Hematuria 09/25/2019   History of kidney stones    Lactic acidosis 12/15/2021   Leukocytosis 09/13/2021   Osteomyelitis ([place])    Sacral pressure injury of skin 07/27/2021   Septic shock ([place]) 07/22/2021   Severe sepsis with acute organ dysfunction ([place]) 07/22/2021   Shortness of breath 05/16/2021   Stage 3a chronic kidney disease ([place]) 64/33/2951   Systolic dysfunction    Type 2 diabetes mellitus 10/12/2019   Past Surgical History:  Procedure Laterality Date   AMPUTATION Left  07/23/2021   Procedure: LEFT BELOW KNEE AMPUTATION;  Surgeon: [name], MD;  Location: [place];  Service: Orthopedics;  Laterality: Left;   BELOW KNEE LEG AMPUTATION Right    BUBBLE STUDY  07/29/2021   Procedure: BUBBLE STUDY;  Surgeon: Sueanne Margarita, MD;  Location: [place];  Service: Cardiovascular;;   CARDIOVERSION N/A 07/29/2021   Procedure: CARDIOVERSION;  Surgeon: Sueanne Margarita, MD;  Location: [facility] ENDOSCOPY;  Service: Cardiovascular;  Laterality: N/A;   RADIOLOGY WITH ANESTHESIA N/A 08/05/2021   Procedure: MRI LUMBAR WITH AND WITHOUT; THORASIC SPINE WITH AND WITHOUT WITH ANESTHESIA;  Surgeon: Radiologist, Medication, MD;  Location: [place];  Service: Radiology;  Laterality: N/A;   RADIOLOGY WITH ANESTHESIA N/A 08/07/2021   Procedure: MRI WITH LUMBER WITH AND WITHOUT CONTRAST,THORACIC WITH AND WITHOUT CONTRAST;  Surgeon: Radiologist, Medication, MD;  Location: [place];  Service: Radiology;  Laterality: N/A;   RADIOLOGY WITH ANESTHESIA N/A 09/13/2021   Procedure: MRI WITH ANESTHESIA;  Surgeon: Luanne Bras, MD;  Location: [place];  Service: Radiology;  Laterality: N/A;   TEE WITHOUT CARDIOVERSION N/A 07/29/2021   Procedure: TRANSESOPHAGEAL ECHOCARDIOGRAM (TEE);  Surgeon: Sueanne Margarita, MD;  Location: [facility] END[facility]COPY;  Service: Cardiovascular;  Laterality: N/A;   Patient Active Problem List   Diagnosis Date Noted   Brittle  diabetes mellitus ([place]) 07/24/2022   Type 1 diabetes mellitus with complications ([place]) 03/02/9484   Malaise 06/22/2022   Polycythemia 06/22/2022   Amputation below knee ([place]) 04/23/2022   PTSD (post-traumatic stress disorder) 04/23/2022   Generalized anxiety disorder 01/21/2022   Episode of unresponsiveness 12/15/2021   Acute respiratory failure with hypoxia (HCC) secondary to suspected aspiration pneumonia 12/15/2021   Renal mass 12/15/2021   Stage IV pressure ulcer of sacral region ([place]) 09/15/2021   Insomnia 46/27/0350   Chronic systolic CHF (congestive  heart failure) ([place]) 09/14/2021   Hypogonadism in male 09/14/2021   Chronic pain 09/14/2021   Pseudohyponatremia 09/13/2021   Normocytic anemia 09/13/2021   Discitis of thoracic region    Paroxysmal atrial flutter ([place]) 07/22/2021   Chronic diastolic (congestive) heart failure ([place]) 05/16/2021   Lumbar spondylosis 03/25/2021   H/O amputation of leg through tibia and fibula ([place]) 11/10/2019   Degeneration of lumbar intervertebral disc 11/10/2019   Hypercholesterolemia 11/10/2019   Left below-knee amputee ([place]) 10/30/2019   Dyslipidemia 10/30/2019   Type 1 diabetes mellitus with diabetic polyneuropathy, with long-term current use of insulin ([place]) 10/12/2019   Right below-elbow amputee 10/12/2019   Hypertensive disorder 09/25/2019   Urinary hesitancy 09/25/2019   Prosthesis adjustments 09/25/2019   Erectile dysfunction 09/25/2019   Palliative care status 09/25/2019   Pulmonary nodules 09/25/2019   Depressive disorder 09/25/2019    REFERRING DIAG: G89.4 (ICD-10-CM) - Chronic pain syndrome   Z89.512 Left Below Knee Amputation, Z89.511 history of right Below Knee Amputation.  THERAPY DIAG:  Other abnormalities of gait and mobility  Unsteadiness on feet  Muscle weakness (generalized)  Impaired functional mobility and activity tolerance  Other low back pain  Rationale for Evaluation and Treatment Rehabilitation  PERTINENT HISTORY:  DM2, CHF, HTN, chronic pain, bilateral TTAs  PRECAUTIONS: Fall  SUBJECTIVE:                                                                                                                                                                                      SUBJECTIVE STATEMENT:  *** He needs prescription for liners & sleeves for coverage.    PAIN:  Are you having pain? Are you having pain? Yes: NPRS scale: today *** 8/10 and in last week ranging 6-10/10 Pain location: middle low back Pain description: sharp Aggravating factors:  extension Relieving factors: sitting forward,   OBJECTIVE: (objective measures completed at initial evaluation unless otherwise dated)   OBJECTIVE:  COGNITION: Overall cognitive status: Within functional limits for tasks assessed   POSTURE: rounded shoulders, forward head, flexed trunk , and weight shift left   LOWER EXTREMITY ROM:   ROM P:passive  A:active Right  eval Left eval  Hip flexion      Hip extension Standing A: -15* Standing A: -15*  Hip abduction      Hip adduction      Hip internal rotation      Hip external rotation      Knee flexion      Knee extension      Ankle dorsiflexion      Ankle plantarflexion      Ankle inversion      Ankle eversion       (Blank rows = not tested)   LOWER EXTREMITY MMT:   MMT Right eval Left eval  Hip flexion 4/5 4/5  Hip extension 3-/5 3-/5  Hip abduction 3/5 3/5  Hip adduction      Hip internal rotation      Hip external rotation      Knee flexion 4/5 4/5  Knee extension 4/5 4/5  Ankle dorsiflexion NA NA  Ankle plantarflexion NA NA  Ankle inversion NA NA  Ankle eversion NA NA  (Blank rows = not tested)   TRANSFERS: Sit to stand: SBA 22" w/c requiring armrests to RW for support for stabilization with poor technique.  Stand to sit: SBA requires RW support to 22" w/c requiring armrests with poor technique.    GAIT: Gait pattern: step to pattern, decreased step length- Right, decreased stance time- Left, Left hip hike, antalgic, trunk flexed, and wide BOS Distance walked: 34' with 90* turn right & left and 180* turn Assistive device utilized: Environmental consultant - 2 wheeled and TTA prosthesis Level of assistance: Min A esp with turns Comments: excessive BUE weight bearing on RW   FUNCTIONAL TESTs:  Berg Balance Scale: 10/56   [facility] PT Assessment - 07/06/22 1300                Standardized Balance Assessment    Standardized Balance Assessment Berg Balance Test          Berg Balance Test    Sit to Stand Needs minimal aid  to stand or to stabilize     Standing Unsupported Needs several tries to stand 30 seconds unsupported     Sitting with Back Unsupported but Feet Supported on Floor or Stool Able to sit safely and securely 2 minutes     Stand to Sit Sits independently, has uncontrolled descent     Transfers Able to transfer safely, definite need of hands     Standing Unsupported with Eyes Closed Needs help to keep from falling     Standing Unsupported with Feet Together Needs help to attain position and unable to hold for 15 seconds     From Standing, Reach Forward with Outstretched Arm Loses balance while trying/requires external support     From Standing Position, Pick up Object from Floor Unable to try/needs assist to keep balance     From Standing Position, Turn to Look Behind Over each Shoulder Needs assist to keep from losing balance and falling     Turn 360 Degrees Needs assistance while turning     Standing Unsupported, Alternately Place Feet on Step/Stool Needs assistance to keep from falling or unable to try     Standing Unsupported, One Foot in [employer] balance while stepping or standing     Standing on One Leg Unable to try or needs assist to prevent fall     Total Score 10     Berg comment: BERG  < 36 high risk for falls (close to 100%)  46-51 moderate (>50%)   37-45 significant (>80%) 52-55 lower (> 25%)                   CURRENT PROSTHETIC WEAR ASSESSMENT: Patient is dependent with: skin check, residual limb care, prosthetic cleaning, ply sock cleaning, correct ply sock adjustment, proper wear schedule/adjustment, and proper weight-bearing schedule/adjustment Donning prosthesis: SBA / verbal cues Doffing prosthesis: SBA / verbal cues Prosthetic wear tolerance: liner & flexible suction socket for most of awake hours and wears prostheses maybe ~40% of awake hours,  7 days/week Prosthetic weight bearing tolerance: 5 minutes Edema: none noted Residual limb condition: bilateral redness over  patella, dry skin, normal color & temperature.  Prosthetic description: suction pin suspension with 42m silicon liner as interface & flexible inner socket, total contact socket, dynamic response Flex feet       TODAY'S TREATMENT:                                                                                                                             DATE:  08/24/2022: ***  08/19/2022: Prosthetic training with bilateral below the knee amputation prostheses: Sit to/from stand from wheelchair to forearm crutches with supervision and verbal cues. And from chairs without armrests using UEs with minA. Patient ambulated 150 feet with forearm crutches with supervision.  Step up & down 4" step with sink & single forearm crutch support 2 reps ea LE. PT cued as HEP including turning around switching single crutch to other UE using counter. Pt return demo & verbalized understanding.  Patient negotiated 12 degree ramp with forearm crutches with min assist and verbal cues. Patient negotiated 4 inch curb with forearm crutches with moderate assistance second person available for safety with verbal and demo cues for technique. PT reviewed walking program short walk room to room with RW so can release for standing ADLs, long walk (max tolerable) with RW for support and medium (distance between short & long) with forearm crutches with supervision. ONLY use forearm crutches on level surfaces. Pt verbalized understanding.   Pt's back pain & trunk weakness are limiting his mobility. PT discussed exercises prone & quadruped but he reports unable to get in or out of this position including prone.   08/17/2022: Prosthetic training with bilateral below the knee amputation prostheses: Sit to/from stand from wheelchair to forearm crutches with supervision and verbal cues Patient ambulated 250 feet with forearm crutches with supervision.  Standing with upright posture with posterior pelvis to counter for 3 min. Step  up & down 4" step with sink & single forearm crutch support 5 reps ea LE. PT cued as HEP but needs one more session prior to trying outside of PT. Patient negotiated 12 degree ramp with forearm crutches with min assist and verbal cues. Patient negotiated 6 inch curb with forearm crutches with moderate assistance second person available for safety with verbal and demo cues for technique.   HOME EXERCISE PROGRAM:   Access  Code: [email or web address] URL: [email or web address] Date: 07/29/2022 Prepared by: Jamey Reas  Exercises - standing on floor eyes open head movements  - 1 x daily - 7 x weekly - 1 sets - 5-10 reps - 5 seconds hold - standing on floor head movements eyes closed  - 1 x daily - 7 x weekly - 1 sets - 5-10 reps - 2 seconds hold - stand on foam eyes open head movements  - 1 x daily - 7 x weekly - 1 sets - 5-10 reps - 5 seconds hold  Access Code: [email or web address] URL: [email or web address] Date: 08/03/2022 Prepared by: Jamey Reas  Exercises - Sit to Stand with Counter Support  - 2-3 x daily - 7 x weekly - 1 sets - 10 reps - 5 seconds hold - Seated Hamstring Stretch with Strap  - 2-3 x daily - 7 x weekly - 1 sets - 2-3 reps - 20-30 seconds hold - Alternating Punch with Resistance  - 1 x daily - 5 x weekly - 1 sets - 10 reps - 5 seconds hold - Standing Single Arm Shoulder Flexion with Posterior Anchored Resistance  - 1 x daily - 5 x weekly - 1 sets - 10 reps - 5 seconds hold - Standing Scapular Protraction with Resistance  - 1 x daily - 7 x weekly - 1 sets - 10 reps - 5 seconds hold - Standing alternate rows with resistance  - 1 x daily - 7 x weekly - 1 sets - 10 reps - 5 seconds hold - Single Arm Shoulder Extension with Anchored Resistance  - 1 x daily - 5 x weekly - 1 sets - 10 reps - 5 seconds hold - Standing Row with Anchored Resistance  - 1 x daily - 7 x weekly - 1 sets - 10 reps - 5 seconds hold - Alternating elbow flexion with resistance  - 1 x daily - 7 x  weekly - 1 sets - 10 reps - 5 seconds hold - Standing Bicep Curls with Resistance  - 1 x daily - 7 x weekly - 1 sets - 10 reps - 5 seconds hold   Do each exercise 1-2  times per day Do each exercise 5-10 repetitions Hold each exercise for 2 seconds to feel your location  AT [place].  Try to find this position when standing still for activities.   USE TAPE ON FLOOR TO MARK THE MIDLINE POSITION which is even with middle of sink.  You also should try to feel with your limb pressure in socket.  You are trying to feel with limb what you used to feel with the bottom of your foot.  Side to Side Shift: Moving your hips only (not shoulders): move weight onto your left leg, HOLD/FEEL pressure in socket.  Move back to equal weight on each leg, HOLD/FEEL pressure in socket. Move weight onto your right leg, HOLD/FEEL pressure in socket. Move back to equal weight on each leg, HOLD/FEEL pressure in socket. Repeat.  Start with both hands on sink, progress to right hand only hold, then left hand only hold  Front to Back Shift: Moving your hips only (not shoulders): move your weight forward onto your toes, HOLD/FEEL pressure in socket. Move your weight back to equal Flat Foot on both legs, HOLD/FEEL  pressure in socket. Move your weight back onto your heels, HOLD/FEEL  pressure in socket. Move your weight back to equal on both legs, HOLD/FEEL  pressure in socket. Repeat.  Start with both hands on sink, progress to right hand only hold, then left hand only hold  Moving Cones / Cups: With equal weight on each leg: Hold on with one hand the first time, then progress to no hand supports. Move cups from one side of sink to the other. Place cups ~2" out of your reach, progress to 10" beyond reach.  Place one hand in middle of sink and reach with other hand. Do both arms.   Overhead/Upward Reaching: alternated reaching up to top cabinets or ceiling if no  cabinets present. Keep equal weight on each leg. Start with one hand support on counter while other hand reaches and progress to no hand support with reaching.  ace one hand in middle of sink and reach with other hand. Do both arms.   5.   Looking Over Shoulders: With equal weight on each leg: alternate turning to look over your shoulders with one hand support on counter as needed.  Start with head motions only to look in front of shoulder, then even with shoulder and progress to looking behind you. To look to side, move head /eyes, then shoulder on side looking pulls back, shift more weight to side looking and pull hip back. Place one hand in middle of sink and let go with other hand so your shoulder can pull back. Switch hands to look other way.   6.  Alternating Stepping or tapping toes into lower cabinet:  Move items under cabinet out of your way. Shift your hips/pelvis so weight on right leg & Tighten muscles in hip on right side.  SLOWLY step left leg so front of foot is in cabinet. Then step back to floor with right hip tight.  Repeat with other leg.     ASSESSMENT:   CLINICAL IMPRESSION: *** Patient appears safe for prosthetic gait with forearm crutches on level surfaces only with supervision of family or friend.   Patient has limitations due to back pain, weakness, balance deficits and activity tolerance deficits   OBJECTIVE IMPAIRMENTS: Abnormal gait, decreased activity tolerance, decreased balance, decreased knowledge of use of DME, decreased mobility, decreased ROM, decreased strength, impaired flexibility, postural dysfunction, prosthetic dependency , and pain.    ACTIVITY LIMITATIONS: carrying, lifting, bending, sitting, standing, squatting, stairs, transfers, and locomotion level   PARTICIPATION LIMITATIONS: meal prep, cleaning, driving, and community activity   PERSONAL FACTORS: Fitness, Time since onset of injury/illness/exacerbation, and 3+ comorbidities: see PMH  are also affecting  patient's functional outcome.    REHAB POTENTIAL: Good   CLINICAL DECISION MAKING: Evolving/moderate complexity   EVALUATION COMPLEXITY: Moderate     GOALS: Goals reviewed with patient? Yes   SHORT TERM GOALS: Target date: 09/03/2022   Patient verbalizes how to adjust ply fit with limb volume changes. Baseline: SEE OBJECTIVE DATA Goal status: ongoing 08/17/2022 2.  Patient verbalizes understanding of updated HEP.  Baseline: SEE OBJECTIVE DATA Goal status: ongoing 08/17/2022   3.  Patient able to transfer sit to stand chairs without armrests & stabilize without external support. Baseline: SEE OBJECTIVE DATA Goal status:  ongoing 08/17/2022   4. Patient ambulates 200' with crutches & prosthesis with supervision. Baseline: SEE OBJECTIVE DATA Goal status:  ongoing 08/17/2022   5. Patient negotiates ramps & curbs with crutches & prosthesis with supervision Baseline: SEE OBJECTIVE DATA Goal status:  ongoing 08/17/2022   LONG TERM GOALS: Target date: 10/01/2022   Patient demonstrates & verbalized understanding of prosthetic care to enable safe  utilization of prosthesis. Baseline: SEE OBJECTIVE DATA Goal status: ongoing 08/17/2022   Patient tolerates prostheses wear >90% of awake hours without skin or limb pain issues. Baseline: SEE OBJECTIVE DATA Goal status: ongoing 08/17/2022   Berg Balance >/= 45/56 to indicate lower fall risk Baseline: SEE OBJECTIVE DATA Goal status: ongoing 08/17/2022   Patient ambulates >300' with prostheses and cane or less independently Baseline: SEE OBJECTIVE DATA Goal status: ongoing 08/17/2022   Patient negotiates ramps, curbs & stairs with single rail with prostheses and cane or less independently. Baseline: SEE OBJECTIVE DATA Goal status: ongoing 08/17/2022   6.   Patient verbalizes & demonstrates understanding of how to properly use fitness equipment to return to gym. Baseline: SEE OBJECTIVE DATA Goal status: ongoing 08/17/2022    PLAN:   PT FREQUENCY: 2x/week   PT DURATION: 90 days / 13 weeks   PLANNED INTERVENTIONS: Therapeutic exercises, Therapeutic activity, Neuromuscular re-education, Balance training, Gait training, Patient/Family education, Self Care, Stair training, Prosthetic training, DME instructions, Electrical stimulation, Cryotherapy, Moist heat, and physical performance testing.   PLAN FOR NEXT SESSION:  Prosthetic gait with forearm crutches including ramps and curbs, try lateral step up on 4" block at sink.    Jamey Reas, PT, DPT 08/19/2022, 1:55 PM

## 2022-08-25 ENCOUNTER — Ambulatory Visit: Payer: Medicare HMO | Admitting: Internal Medicine

## 2022-08-25 NOTE — Progress Notes (Signed)
[name], [name] ([id number]) [id number] Page 1 of 8 Visit Report for 08/24/2022 Arrival Information Details Patient Name: Date of Service: [name], [name] 08/24/2022 11:15 A M Medical Record Number: [id number] Patient Account Number: [id number] Date of Birth/Sex: Treating RN: 1966/01/22 (56 y.o. [name] Primary Care Liliya Fullenwider: Berniece Pap Other Clinician: Referring Kenan Moodie: Treating Beyonca Wisz/Extender: Gerilyn Nestle in Treatment: 71 Visit Information History Since Last Visit Added or deleted any medications: No Patient Arrived: Wheel Chair Any new allergies or adverse reactions: No Arrival Time: 11:14 Had a fall or experienced change in No Transfer Assistance: None activities of daily living that may affect Patient Identification Verified: Yes risk of falls: Secondary Verification Process Completed: Yes Signs or symptoms of abuse/neglect since last visito No Patient Requires Transmission-Based Precautions: No Hospitalized since last visit: No Patient Has Alerts: No Implantable device outside of the clinic excluding No cellular tissue based products placed in the center since last visit: Has Dressing in Place as Prescribed: Yes Pain Present Now: No Electronic Signature(s) Signed: 08/24/2022 5:09:12 PM By: Blanche East RN Entered By: Blanche East on 08/24/2022 11:26:33 -------------------------------------------------------------------------------- Clinic Level of Care Assessment Details Patient Name: Date of Service: [place], [name] 08/24/2022 11:15 A M Medical Record Number: [id number] Patient Account Number: [id number] Date of Birth/Sex: Treating RN: 11-Jan-1966 (56 y.o. [name] Primary Care Raymir Frommelt: Berniece Pap Other Clinician: Referring Genella Bas: Treating Rasheena Talmadge/Extender: Gerilyn Nestle in Treatment: 58 Clinic Level of Care Assessment Items TOOL 4 Quantity Score X- 1  0 Use when only an EandM is performed on FOLLOW-UP visit ASSESSMENTS - Nursing Assessment / Reassessment X- 1 10 Reassessment of Co-morbidities (includes updates in patient status) X- 1 5 Reassessment of Adherence to Treatment Plan ASSESSMENTS - Wound and Skin A ssessment / Reassessment X - Simple Wound Assessment / Reassessment - one wound 1 5 '[]'$  - 0 Complex Wound Assessment / Reassessment - multiple wounds '[]'$  - 0 Dermatologic / Skin Assessment (not related to wound area) ASSESSMENTS - Focused Assessment '[]'$  - 0 Circumferential Edema Measurements - multi extremities '[]'$  - 0 Nutritional Assessment / Counseling / Intervention DALAN, COWGER ([id number]) [id number] Page 2 of 8 '[]'$  - 0 Lower Extremity Assessment (monofilament, tuning fork, pulses) '[]'$  - 0 Peripheral Arterial Disease Assessment (using hand held doppler) ASSESSMENTS - Ostomy and/or Continence Assessment and Care '[]'$  - 0 Incontinence Assessment and Management '[]'$  - 0 Ostomy Care Assessment and Management (repouching, etc.) PROCESS - Coordination of Care X - Simple Patient / Family Education for ongoing care 1 15 '[]'$  - 0 Complex (extensive) Patient / Family Education for ongoing care X- 1 10 Staff obtains Programmer, systems, Records, T Results / Process Orders est X- 1 10 Staff telephones HHA, Nursing Homes / Clarify orders / etc X- 1 10 Routine Transfer to another Facility (non-emergent condition) '[]'$  - 0 Routine Hospital Admission (non-emergent condition) '[]'$  - 0 New Admissions / Biomedical engineer / Ordering NPWT Apligraf, etc. , '[]'$  - 0 Emergency Hospital Admission (emergent condition) '[]'$  - 0 Simple Discharge Coordination '[]'$  - 0 Complex (extensive) Discharge Coordination PROCESS - Special Needs '[]'$  - 0 Pediatric / Minor Patient Management '[]'$  - 0 Isolation Patient Management '[]'$  - 0 Hearing / Language / Visual special needs '[]'$  - 0 Assessment of Community assistance (transportation, D/C  planning, etc.) '[]'$  - 0 Additional assistance / Altered mentation '[]'$  - 0 Support Surface(s) Assessment (bed, cushion, seat, etc.) INTERVENTIONS - Wound Cleansing / Measurement X - Simple Wound Cleansing - one wound  1 5 '[]'$  - 0 Complex Wound Cleansing - multiple wounds X- 1 5 Wound Imaging (photographs - any number of wounds) '[]'$  - 0 Wound Tracing (instead of photographs) X- 1 5 Simple Wound Measurement - one wound '[]'$  - 0 Complex Wound Measurement - multiple wounds INTERVENTIONS - Wound Dressings X - Small Wound Dressing one or multiple wounds 1 10 '[]'$  - 0 Medium Wound Dressing one or multiple wounds '[]'$  - 0 Large Wound Dressing one or multiple wounds '[]'$  - 0 Application of Medications - topical '[]'$  - 0 Application of Medications - injection INTERVENTIONS - Miscellaneous '[]'$  - 0 External ear exam '[]'$  - 0 Specimen Collection (cultures, biopsies, blood, body fluids, etc.) '[]'$  - 0 Specimen(s) / Culture(s) sent or taken to Lab for analysis '[]'$  - 0 Patient Transfer (multiple staff / Civil Service fast streamer / Similar devices) '[]'$  - 0 Simple Staple / Suture removal (25 or less) '[]'$  - 0 Complex Staple / Suture removal (26 or more) '[]'$  - 0 Hypo / Hyperglycemic Management (close monitor of Blood Glucose) Tinoco, Jaydian ([id number]) [id number] Page 3 of 8 '[]'$  - 0 Ankle / Brachial Index (ABI) - do not check if billed separately X- 1 5 Vital Signs Has the patient been seen at the hospital within the last three years: Yes Total Score: 95 Level Of Care: New/Established - Level 3 Electronic Signature(s) Signed: 08/24/2022 5:09:12 PM By: Blanche East RN Entered By: Blanche East on 08/24/2022 11:42:19 -------------------------------------------------------------------------------- Lower Extremity Assessment Details Patient Name: Date of Service: [name], [name] 08/24/2022 11:15 A M Medical Record Number: [id number] Patient Account Number: [id number] Date of Birth/Sex:  Treating RN: 1966-06-06 (56 y.o. [name] Primary Care Kitt Minardi: Berniece Pap Other Clinician: Referring Yilin Weedon: Treating Fahd Galea/Extender: Gerilyn Nestle in Treatment: 32 Electronic Signature(s) Signed: 08/24/2022 5:09:12 PM By: Blanche East RN Entered By: Blanche East on 08/24/2022 11:38:57 -------------------------------------------------------------------------------- Multi Wound Chart Details Patient Name: Date of Service: [name], [name] 08/24/2022 11:15 A M Medical Record Number: [id number] Patient Account Number: [id number] Date of Birth/Sex: Treating RN: July 08, 1966 (56 y.o. M) Primary Care Frona Yost: Berniece Pap Other Clinician: Referring Jadrien Narine: Treating Kadee Philyaw/Extender: Gerilyn Nestle in Treatment: 32 Vital Signs Height(in): Pulse(bpm): 93 Weight(lbs): Blood Pressure(mmHg): 146/84 Body Mass Index(BMI): Temperature(F): 97.5 Respiratory Rate(breaths/min): 18 [1:Photos:] [N/A:N/A] Sacrum N/A N/A Wound Location: Pressure Injury N/A N/A Wounding Event: Pressure Ulcer N/A N/A Primary Etiology: Congestive Heart Failure, N/A N/A Comorbid History: Hypertension, Type II Diabetes, Osteomyelitis, Confinement Anxiety 07/29/2021 N/A N/A Date Acquired: 35 N/A N/A Weeks of TreatmentKLEVER, [name] ([id number]) [id number] Page 4 of 8 Open N/A N/A Wound Status: No N/A N/A Wound Recurrence: 0.9x0.8x0.9 N/A N/A Measurements L x W x D (cm) 0.565 N/A N/A A (cm) : rea 0.509 N/A N/A Volume (cm) : 90.40% N/A N/A % Reduction in A rea: 92.10% N/A N/A % Reduction in Volume: 12 Starting Position 1 (o'clock): 12 Ending Position 1 (o'clock): 1 Maximum Distance 1 (cm): Yes N/A N/A Undermining: Category/Stage IV N/A N/A Classification: Medium N/A N/A Exudate A mount: Serosanguineous N/A N/A Exudate Type: red, brown N/A N/A Exudate Color: Epibole N/A N/A Wound  Margin: Large (67-100%) N/A N/A Granulation A mount: Pink, Pale N/A N/A Granulation Quality: Small (1-33%) N/A N/A Necrotic A mount: Fat Layer (Subcutaneous Tissue): Yes N/A N/A Exposed Structures: Fascia: No Tendon: No Muscle: No Joint: No Bone: No Small (1-33%) N/A N/A Epithelialization: Excoriation: Yes N/A N/A Periwound Skin Texture: Callus: Yes Rash: Yes Maceration: Yes N/A N/A Periwound  Skin Moisture: Dry/Scaly: No No Abnormalities Noted N/A N/A Periwound Skin Color: No Abnormality N/A N/A Temperature: Treatment Notes Electronic Signature(s) Signed: 08/24/2022 5:06:55 PM By: Linton Ham MD Entered By: Linton Ham on 08/24/2022 12:34:12 -------------------------------------------------------------------------------- Multi-Disciplinary Care Plan Details Patient Name: Date of Service: [name], [name] 08/24/2022 11:15 A M Medical Record Number: [id number] Patient Account Number: [id number] Date of Birth/Sex: Treating RN: October 04, 1965 (56 y.o. [name] Primary Care Estiben Mizuno: Berniece Pap Other Clinician: Referring Illias Pantano: Treating Samyia Motter/Extender: Gerilyn Nestle in Treatment: [place] reviewed with physician Active Inactive Nutrition Nursing Diagnoses: Impaired glucose control: actual or potential Potential for alteratiion in Nutrition/Potential for imbalanced nutrition Goals: Patient/caregiver will maintain therapeutic glucose control Date Initiated: 01/08/2022 Target Resolution Date: 09/04/2022 Goal Status: Active Interventions: Assess HgA1c results as ordered upon admission and as needed Assess patient nutrition upon admission and as needed per policy Provide education on elevated blood sugars and impact on wound healing [name], [name] ([id number]) [id number] Page 5 of 8 Treatment Activities: Dietary management education, guidance and counseling : 01/08/2022 Patient  referred to Primary Care Physician for further nutritional evaluation : 01/08/2022 Notes: Pressure Nursing Diagnoses: Knowledge deficit related to causes and risk factors for pressure ulcer development Knowledge deficit related to management of pressures ulcers Potential for impaired tissue integrity related to pressure, friction, moisture, and shear Goals: Patient/caregiver will verbalize understanding of pressure ulcer management Date Initiated: 01/08/2022 Target Resolution Date: 09/04/2022 Goal Status: Active Interventions: Assess: immobility, friction, shearing, incontinence upon admission and as needed Assess offloading mechanisms upon admission and as needed Assess potential for pressure ulcer upon admission and as needed Notes: Wound/Skin Impairment Nursing Diagnoses: Impaired tissue integrity Knowledge deficit related to ulceration/compromised skin integrity Goals: Patient/caregiver will verbalize understanding of skin care regimen Date Initiated: 01/08/2022 Target Resolution Date: 09/04/2022 Goal Status: Active Ulcer/skin breakdown will have a volume reduction of 30% by week 4 Date Initiated: 01/08/2022 Date Inactivated: 02/18/2022 Target Resolution Date: 02/05/2022 Goal Status: Unmet Unmet Reason: VAC leaking Ulcer/skin breakdown will have a volume reduction of 50% by week 8 Date Initiated: 02/18/2022 Date Inactivated: 03/04/2022 Target Resolution Date: 03/05/2022 Goal Status: Unmet Unmet Reason: infection Ulcer/skin breakdown will have a volume reduction of 80% by week 12 Date Initiated: 03/04/2022 Date Inactivated: 04/01/2022 Target Resolution Date: 04/02/2022 Goal Status: Unmet Unmet Reason: too much moisture Interventions: Assess patient/caregiver ability to obtain necessary supplies Assess patient/caregiver ability to perform ulcer/skin care regimen upon admission and as needed Assess ulceration(s) every visit Provide education on ulcer and skin care Treatment  Activities: Skin care regimen initiated : 01/08/2022 Topical wound management initiated : 01/08/2022 Notes: Electronic Signature(s) Signed: 08/24/2022 5:09:12 PM By: Blanche East RN Entered By: Blanche East on 08/24/2022 11:36:07 -------------------------------------------------------------------------------- Pain Assessment Details Patient Name: Date of Service: [name], [name] 08/24/2022 11:15 A M Medical Record Number: [id number] Patient Account Number: [id number] Date of Birth/Sex: Treating RN: 16-Apr-1966 (56 y.o. [name] Primary Care Rainee Sweatt: Berniece Pap Other Clinician: Referring Nyellie Yetter: Treating Kandra Graven/Extender: De Nurse [place], [name] ([id number]) [id number] Page 6 of 8 Weeks in Treatment: 32 Active Problems Location of Pain Severity and Description of Pain Patient Has Paino No Site Locations With Dressing Change: No Rate the pain. Current Pain Level: 0 Pain Management and Medication Current Pain Management: Electronic Signature(s) Signed: 08/24/2022 5:09:12 PM By: Blanche East RN Entered By: Blanche East on 08/24/2022 11:38:50 -------------------------------------------------------------------------------- Patient/Caregiver Education Details Patient Name: Date of Service: [name], [name] 12/18/2023andnbsp11:15 A M Medical Record Number:  [id number] Patient Account Number: [id number] Date of Birth/Gender: Treating RN: 06-19-1966 (56 y.o. [name] Primary Care Physician: Berniece Pap Other Clinician: Referring Physician: Treating Physician/Extender: Gerilyn Nestle in Treatment: 24 Education Assessment Education Provided To: Patient Education Topics Provided Wound/Skin Impairment: Methods: Explain/Verbal Responses: Reinforcements needed, State content correctly Electronic Signature(s) Signed: 08/24/2022 5:09:12 PM By: Blanche East RN Entered By: Blanche East on  08/24/2022 11:40:09 [name] ([id number]) [id number] Page 7 of 8 -------------------------------------------------------------------------------- Wound Assessment Details Patient Name: Date of Service: [name], [name] 08/24/2022 11:15 A M Medical Record Number: [id number] Patient Account Number: [id number] Date of Birth/Sex: Treating RN: 15-Nov-1965 (56 y.o. [name] Primary Care Etheridge Geil: Berniece Pap Other Clinician: Referring Edvardo Honse: Treating Kiana Hollar/Extender: Gerilyn Nestle in Treatment: 32 Wound Status Wound Number: 1 Primary Pressure Ulcer Etiology: Wound Location: Sacrum Wound Open Wounding Event: Pressure Injury Status: Date Acquired: 07/29/2021 Comorbid Congestive Heart Failure, Hypertension, Type II Diabetes, Weeks Of Treatment: 32 History: Osteomyelitis, Confinement Anxiety Clustered Wound: No Photos Wound Measurements Length: (cm) 0.9 Width: (cm) 0.8 Depth: (cm) 0.9 Area: (cm) 0.565 Volume: (cm) 0.509 % Reduction in Area: 90.4% % Reduction in Volume: 92.1% Epithelialization: Small (1-33%) Tunneling: No Undermining: Yes Starting Position (o'clock): 12 Ending Position (o'clock): 12 Maximum Distance: (cm) 1 Wound Description Classification: Category/Stage IV Wound Margin: Epibole Exudate Amount: Medium Exudate Type: Serosanguineous Exudate Color: red, brown Foul Odor After Cleansing: No Slough/Fibrino Yes Wound Bed Granulation Amount: Large (67-100%) Exposed Structure Granulation Quality: Pink, Pale Fascia Exposed: No Necrotic Amount: Small (1-33%) Fat Layer (Subcutaneous Tissue) Exposed: Yes Necrotic Quality: Adherent Slough Tendon Exposed: No Muscle Exposed: No Joint Exposed: No Bone Exposed: No Periwound Skin Texture Texture Color No Abnormalities Noted: No No Abnormalities Noted: Yes Callus: Yes Temperature / Pain Excoriation: Yes Temperature: No Abnormality Rash:  Yes Moisture No Abnormalities Noted: No Dry / Scaly: No MacerationYONIS, [name] ([id number]) [id number] Page 8 of 8 Electronic Signature(s) Signed: 08/24/2022 5:09:12 PM By: Blanche East RN Entered By: Blanche East on 08/24/2022 11:39:10 -------------------------------------------------------------------------------- Vitals Details Patient Name: Date of Service: [name], [name] 08/24/2022 11:15 A M Medical Record Number: [id number] Patient Account Number: [id number] Date of Birth/Sex: Treating RN: 1965/09/23 (56 y.o. [name] Primary Care Darin Redmann: Berniece Pap Other Clinician: Referring Sequoya Hogsett: Treating Alyvia Derk/Extender: Gerilyn Nestle in Treatment: 32 Vital Signs Time Taken: 11:20 Temperature (F): 97.5 Pulse (bpm): 93 Respiratory Rate (breaths/min): 18 Blood Pressure (mmHg): 146/84 Reference Range: 80 - 120 mg / dl Electronic Signature(s) Signed: 08/24/2022 5:09:12 PM By: Blanche East RN Entered By: Blanche East on 08/24/2022 11:38:34

## 2022-08-25 NOTE — Progress Notes (Signed)
LINO, WICKLIFF ([id number]) [id number] Page 1 of 7 Visit Report for 08/24/2022 HPI Details Patient Name: Date of Service: [place], [name] 08/24/2022 11:15 A M Medical Record Number: [id number] Patient Account Number: [id number] Date of Birth/Sex: Treating RN: 23-Jan-1966 (56 y.o. M) Primary Care Provider: Berniece Pap Other Clinician: Referring Provider: Treating Provider/Extender: Gerilyn Nestle in Treatment: 45 History of Present Illness HPI Description: ADMISSION 01/08/2022 This is a 56 year old male with a past medical history notable for type 2 diabetes mellitus (last A1c was 8.6) congestive heart failure, hypertension, chronic pain, and bilateral below-knee amputations. His most recent amputation was in November 2022. While he was in the hospital, he developed a sacral pressure ulcer. He was subsequently in a skilled nursing facility for some time. He was discharged with home health and had been in a wound VAC, but was then admitted to the hospital last week when the wound appeared to be worsening. Apparently the periwound skin was macerated and the device that he had been using was leaking quite a bit. Evaluation while in the hospital included a consultation with infectious disease who did not think he had any evidence of osteomyelitis, plastic surgery who felt that he was not an appropriate flap candidate (their note also states that he is not interested in a flap) and wound care who took him out of the wound VAC and initiated wet-to-dry dressing changes. He has a new wound VAC from [facility] on order, anticipated delivery today. The wound itself is fairly small and isolated to the sacrum. There is muscle exposed. No bone is appreciated but it is palpable beneath the surface. The muscle itself is bit pale and there is heaped up epibole around the wound edges. No significant odor or drainage. 01/14/2022: His wound VAC was not initiated until  this past Friday. He has not had any issues with the VAC but today we found that the bridge foam was applied directly to the skin rather than over a layer of adhesive drape. His home health nurse also requested that we consider applying silver collagen to the wound bed in addition to the VAC. There is still a little bit of heaped up senescent skin around the wound periphery. No significant change to the wound dimensions. 01/21/2022: No significant change to the wound dimensions. The senescent skin has not reaccumulated. The periwound skin remains a bit macerated but without any obvious breakdown. The wound surface itself has a shiny appearance with a little bit of slough accumulation; no true robust granulation tissue at this time. 01/28/2022: The wound is about the same size but a little bit shallower. There is a little bit of senescent skin reaccumulation at the cranial aspect. The periwound skin is red but not macerated and without any tissue breakdown. The wound still does not have the most robust surface. There is a bit of slough accumulation. 02/04/2022: The wound is a bit smaller and the undermining has come in somewhat. There continues to be granulation tissue formation within the wound bed. No significant slough accumulation and his periwound skin is in better condition. 02/18/2022: The wound stinks today. There is no obvious pus but the drainage and wound itself are malodorous. He continues to have heaped up tissue within the wound bed that is rather grayish and not particularly robust-appearing. He is very angry today with his situation. 02/25/2022: Last week, in response to the odor coming from the wound, I took a culture and prescribed topical gentamicin as well as oral cefdinir. Apparently  [facility] contacted him about a topical compounded antibiotic, but he did not realize this and he hung up on them. Today, the odor is no longer present. There is some senescent skin around the wound margins  as well as continued heaped up granulation tissue in the center of the wound. The undermining continues to contract. 03/04/2022: The wound continues to contract and look better. He still has heaped up hypertrophic granulation tissue near the orifice. No odor was coming from the wound today. He is awaiting [place] delivery. 03/18/2022: 2-week follow-up. [facility] topical antibiotic has been initiated. The chemical cauterization of the hypertrophic granulation tissue was quite successful and the surface is much flatter today. He has heaped up senescent skin around the perimeter. His home health providers have figured out a way to keep the wound VAC from losing suction by bolstering with DuoDERM. Overall there has been significant improvement since our last visit. 04/01/2022: 2-week follow-up. The wound continues to contract. Once again, there is heaped up senescent skin around the perimeter. He has a little bit of skin breakdown in the distribution of the adhesive drape from the wound VAC. No odor or purulent drainage. No concern for infection. 04/15/2022: 2-week follow-up. He has developed a fairly substantial rash from the drape adhesive for his wound VAC. The periwound has a lot of heaped up epiboly at the margins. The tissue in the wound bed is a little bit pale but there is no odor or purulent drainage. 04/22/2022: Last week we discontinued the VAC. Today, the skin around his wound is in significantly improved condition. His rash is resolving. He does have some heaped up senescent skin around the wound margin and a layer of slough on the wound surface, but there is no concern for active infection. 04/29/2022: The wound continues to contract. The surface is clean. He continues to build up senescent skin around the wound edges that subsequently get a little bit macerated. No concern for infection. 05/06/2022: The wound is smaller again today in all dimensions. The surface is clean. He has his usual  accumulation of macerated senescent skin around the wound edges. 05/13/2022: Continued wound contracture. The undermining has decreased quite a bit. Clean surface. Senescent skin heaped up around the wound edges, as per usual. 05/22/2022: The macerated senescent skin has accumulated once again. The wound dimensions are slightly smaller and the surface is clean. He has been having difficulty keeping the packing in the wound due to the large foam border dressing that he prefers. 06/03/2022: The wound is smaller and shallower with less undermining. He has built up macerated senescent skin around the margins, as per usual. He and his friend have figured out a way to make sure that his packing stays in the wound with his [place]. MERRICK, MAGGIO ([id number]) [id number] Page 2 of 7 06/12/2022: The wound is a little bit shallower again today. He still has accumulated senescent periwound skin, but otherwise the wound is clean. He will be undergoing bilateral recurrent inguinal hernia repair and umbilical hernia repair next week. 10/20 sacral pressure ulcer. using [facility] and backing wet to dry. Has developed a rash sur rounding the wound 07/03/2022: Continued contracture of the wound. It is very clean and there is no odor or purulent drainage. Continued buildup of senescent skin around the margin. 07/13/2022: There is less undermining present today. The orifice continues to contract. He continues to accumulate senescent skin around the perimeter. The rash around the wound has improved. 07/27/2022: The wound is smaller again today and  the undermining is closed then. There is senescent skin accumulation, as usual. 08/07/2022: The wound continues to contract but has its usual accumulation of periwound senescent skin. Unfortunately, the periwound skin looks like it has gotten more moist. It is red and angry and the patient says it is more painful. 08/14/2022: The wound is smaller again  this week. His periwound skin is in much better condition. There is still senescent skin around the wound margins and a bit of slough on the surface. 12/18; this is a lower sacral pressure ulcer. We have been using silver alginate for the last 2 weeks. This is longstanding and refractory. At 1 point I think had exposed bone he no longer has this now. He tells me that he has had problems with his right lower leg stump and he is going to see hangers she is therefore not walking but using his wheelchair although he says he is in his wheelchair "only 10% of the time]. Otherwise he claims to be rigorous with offloading this. Electronic Signature(s) Signed: 08/24/2022 5:06:55 PM By: Linton Ham MD Entered By: Linton Ham on 08/24/2022 12:35:22 -------------------------------------------------------------------------------- Physical Exam Details Patient Name: Date of Service: [name], [name] 08/24/2022 11:15 A M Medical Record Number: [id number] Patient Account Number: [id number] Date of Birth/Sex: Treating RN: [birth year] (56 y.o. M) Primary Care Provider: Berniece Pap Other Clinician: Referring Provider: Treating Provider/Extender: Gerilyn Nestle in Treatment: 32 Constitutional Sitting or standing Blood Pressure is within target range for patient.. Pulse regular and within target range for patient.Marland Kitchen Respirations regular, non-labored and within target range.. Temperature is normal and within the target range for the patient.Marland Kitchen Appears in no distress. Notes Wound exam I do not know that the wound is much smaller in terms of serve conference. It has 1 cm of depth and circumferential undermining there is no palpable bone. There is circumference of the actual orifice has thick macerated white skin around the edges. I did not think anything required mechanical debridement at this point. Electronic Signature(s) Signed: 08/24/2022 5:06:55 PM By: Linton Ham  MD Entered By: Linton Ham on 08/24/2022 12:36:19 -------------------------------------------------------------------------------- Physician Orders Details Patient Name: Date of Service: [name], [name] 08/24/2022 11:15 A M Medical Record Number: [id number] Patient Account Number: [id number] Date of Birth/Sex: Treating RN: September 17, 1965 (56 y.o. [place] Session Primary Care Provider: Berniece Pap Other Clinician: Referring Provider: Treating Provider/Extender: Gerilyn Nestle in Treatment: [address] Verbal / Phone Orders: No [name] ([id number]) [id number] Page 3 of 7 Diagnosis Coding Follow-up Appointments ppointment in 2 weeks. - Dr. Celine Ahr - room 1 Return A Anesthetic Wound #1 Sacrum (In clinic) Topical Lidocaine 4% applied to wound bed Bathing/ Shower/ Hygiene May shower with protection but do not get wound dressing(s) wet. Off-Loading Gel mattress overlay (Group 1) Turn and reposition every 2 hours Wound Treatment Wound #1 - Sacrum Cleanser: Soap and Water 3 x Per Week/30 Days Discharge Instructions: May shower and wash wound with dial antibacterial soap and water prior to dressing change. Cleanser: Wound Cleanser 3 x Per Week/30 Days Discharge Instructions: Cleanse the wound with wound cleanser prior to applying a clean dressing using gauze sponges, not tissue or cotton balls. Peri-Wound Care: Skin Prep 3 x Per Week/30 Days Discharge Instructions: Use skin prep as directed Peri-Wound Care: Zinc Oxide Ointment 30g tube 3 x Per Week/30 Days Discharge Instructions: Apply Zinc Oxide to macerated periwound with each dressing change as needed Prim Dressing: Sorbalgon AG Dressing 2x2 (in/in) 3 x Per  Week/30 Days ary Discharge Instructions: Apply to wound bed as instructed Secondary Dressing: Zetuvit Plus Silicone Border Sacrum Dressing, Sm, 7x7 (in/in) 3 x Per Week/30 Days Discharge Instructions: Apply silicone border over  primary dressing as directed. Secured With: 18M Medipore H Soft Cloth Surgical T ape, 4 x 10 (in/yd) 3 x Per Week/30 Days Discharge Instructions: Secure with tape as directed. Add-Ons: Gloves, Large 3 x Per Week/30 Days Electronic Signature(s) Signed: 08/24/2022 5:06:55 PM By: Linton Ham MD Signed: 08/24/2022 5:09:12 PM By: Blanche East RN Entered By: Blanche East on 08/24/2022 11:58:31 -------------------------------------------------------------------------------- Problem List Details Patient Name: Date of Service: [name], [name] 08/24/2022 11:15 A M Medical Record Number: [id number] Patient Account Number: [id number] Date of Birth/Sex: Treating RN: 12/19/1965 (56 y.o. M) Primary Care Provider: Berniece Pap Other Clinician: Referring Provider: Treating Provider/Extender: Gerilyn Nestle in Treatment: 32 Active Problems ICD-10 Encounter Code Description Active Date MDM Diagnosis L89.154 Pressure ulcer of sacral region, stage 4 01/08/2022 No Yes Z89.512 Acquired absence of left leg below knee 01/08/2022 No Yes Seyer, Herbie [place] ([id number]) [id number] Page 4 of 7 Z89.511 Acquired absence of right leg below knee 01/08/2022 No Yes Inactive Problems Resolved Problems Electronic Signature(s) Signed: 08/24/2022 5:06:55 PM By: Linton Ham MD Entered By: Linton Ham on 08/24/2022 12:33:50 -------------------------------------------------------------------------------- Progress Note Details Patient Name: Date of Service: [name], [name] 08/24/2022 11:15 A M Medical Record Number: [id number] Patient Account Number: [id number] Date of Birth/Sex: Treating RN: [birth year] (56 y.o. M) Primary Care Provider: Berniece Pap Other Clinician: Referring Provider: Treating Provider/Extender: Gerilyn Nestle in Treatment: 32 Subjective History of Present Illness (HPI) ADMISSION 01/08/2022 This is a 56 year old  male with a past medical history notable for type 2 diabetes mellitus (last A1c was 8.6) congestive heart failure, hypertension, chronic pain, and bilateral below-knee amputations. His most recent amputation was in November 2022. While he was in the hospital, he developed a sacral pressure ulcer. He was subsequently in a skilled nursing facility for some time. He was discharged with home health and had been in a wound VAC, but was then admitted to the hospital last week when the wound appeared to be worsening. Apparently the periwound skin was macerated and the device that he had been using was leaking quite a bit. Evaluation while in the hospital included a consultation with infectious disease who did not think he had any evidence of osteomyelitis, plastic surgery who felt that he was not an appropriate flap candidate (their note also states that he is not interested in a flap) and wound care who took him out of the wound VAC and initiated wet-to-dry dressing changes. He has a new wound VAC from [facility] on order, anticipated delivery today. The wound itself is fairly small and isolated to the sacrum. There is muscle exposed. No bone is appreciated but it is palpable beneath the surface. The muscle itself is bit pale and there is heaped up epibole around the wound edges. No significant odor or drainage. 01/14/2022: His wound VAC was not initiated until this past Friday. He has not had any issues with the VAC but today we found that the bridge foam was applied directly to the skin rather than over a layer of adhesive drape. His home health nurse also requested that we consider applying silver collagen to the wound bed in addition to the VAC. There is still a little bit of heaped up senescent skin around the wound periphery. No significant change to the wound dimensions. 01/21/2022:  No significant change to the wound dimensions. The senescent skin has not reaccumulated. The periwound skin remains a bit  macerated but without any obvious breakdown. The wound surface itself has a shiny appearance with a little bit of slough accumulation; no true robust granulation tissue at this time. 01/28/2022: The wound is about the same size but a little bit shallower. There is a little bit of senescent skin reaccumulation at the cranial aspect. The periwound skin is red but not macerated and without any tissue breakdown. The wound still does not have the most robust surface. There is a bit of slough accumulation. 02/04/2022: The wound is a bit smaller and the undermining has come in somewhat. There continues to be granulation tissue formation within the wound bed. No significant slough accumulation and his periwound skin is in better condition. 02/18/2022: The wound stinks today. There is no obvious pus but the drainage and wound itself are malodorous. He continues to have heaped up tissue within the wound bed that is rather grayish and not particularly robust-appearing. He is very angry today with his situation. 02/25/2022: Last week, in response to the odor coming from the wound, I took a culture and prescribed topical gentamicin as well as oral cefdinir. Apparently [facility] contacted him about a topical compounded antibiotic, but he did not realize this and he hung up on them. Today, the odor is no longer present. There is some senescent skin around the wound margins as well as continued heaped up granulation tissue in the center of the wound. The undermining continues to contract. 03/04/2022: The wound continues to contract and look better. He still has heaped up hypertrophic granulation tissue near the orifice. No odor was coming from the wound today. He is awaiting [place] delivery. 03/18/2022: 2-week follow-up. [facility] topical antibiotic has been initiated. The chemical cauterization of the hypertrophic granulation tissue was quite successful and the surface is much flatter today. He has heaped up senescent  skin around the perimeter. His home health providers have figured out a way to keep the wound VAC from losing suction by bolstering with DuoDERM. Overall there has been significant improvement since our last visit. 04/01/2022: 2-week follow-up. The wound continues to contract. Once again, there is heaped up senescent skin around the perimeter. He has a little bit of skin breakdown in the distribution of the adhesive drape from the wound VAC. No odor or purulent drainage. No concern for infection. 04/15/2022: 2-week follow-up. He has developed a fairly substantial rash from the drape adhesive for his wound VAC. The periwound has a lot of heaped up epiboly at the margins. The tissue in the wound bed is a little bit pale but there is no odor or purulent drainage. EZZARD, DITMER ([id number]) [id number] Page 5 of 7 04/22/2022: Last week we discontinued the VAC. Today, the skin around his wound is in significantly improved condition. His rash is resolving. He does have some heaped up senescent skin around the wound margin and a layer of slough on the wound surface, but there is no concern for active infection. 04/29/2022: The wound continues to contract. The surface is clean. He continues to build up senescent skin around the wound edges that subsequently get a little bit macerated. No concern for infection. 05/06/2022: The wound is smaller again today in all dimensions. The surface is clean. He has his usual accumulation of macerated senescent skin around the wound edges. 05/13/2022: Continued wound contracture. The undermining has decreased quite a bit. Clean surface. Senescent skin heaped up  around the wound edges, as per usual. 05/22/2022: The macerated senescent skin has accumulated once again. The wound dimensions are slightly smaller and the surface is clean. He has been having difficulty keeping the packing in the wound due to the large foam border dressing that he  prefers. 06/03/2022: The wound is smaller and shallower with less undermining. He has built up macerated senescent skin around the margins, as per usual. He and his friend have figured out a way to make sure that his packing stays in the wound with his [place]. 06/12/2022: The wound is a little bit shallower again today. He still has accumulated senescent periwound skin, but otherwise the wound is clean. He will be undergoing bilateral recurrent inguinal hernia repair and umbilical hernia repair next week. 10/20 sacral pressure ulcer. using [facility] and backing wet to dry. Has developed a rash sur rounding the wound 07/03/2022: Continued contracture of the wound. It is very clean and there is no odor or purulent drainage. Continued buildup of senescent skin around the margin. 07/13/2022: There is less undermining present today. The orifice continues to contract. He continues to accumulate senescent skin around the perimeter. The rash around the wound has improved. 07/27/2022: The wound is smaller again today and the undermining is closed then. There is senescent skin accumulation, as usual. 08/07/2022: The wound continues to contract but has its usual accumulation of periwound senescent skin. Unfortunately, the periwound skin looks like it has gotten more moist. It is red and angry and the patient says it is more painful. 08/14/2022: The wound is smaller again this week. His periwound skin is in much better condition. There is still senescent skin around the wound margins and a bit of slough on the surface. 12/18; this is a lower sacral pressure ulcer. We have been using silver alginate for the last 2 weeks. This is longstanding and refractory. At 1 point I think had exposed bone he no longer has this now. He tells me that he has had problems with his right lower leg stump and he is going to see hangers she is therefore not walking but using his wheelchair although he says he is in his wheelchair "only  10% of the time]. Otherwise he claims to be rigorous with offloading this. Objective Constitutional Sitting or standing Blood Pressure is within target range for patient.. Pulse regular and within target range for patient.Marland Kitchen Respirations regular, non-labored and within target range.. Temperature is normal and within the target range for the patient.Marland Kitchen Appears in no distress. Vitals Time Taken: 11:20 AM, Temperature: 97.5 F, Pulse: 93 bpm, Respiratory Rate: 18 breaths/min, Blood Pressure: 146/84 mmHg. General Notes: Wound exam I do not know that the wound is much smaller in terms of serve conference. It has 1 cm of depth and circumferential undermining there is no palpable bone. There is circumference of the actual orifice has thick macerated white skin around the edges. I did not think anything required mechanical debridement at this point. Integumentary (Hair, Skin) Wound #1 status is Open. Original cause of wound was Pressure Injury. The date acquired was: 07/29/2021. The wound has been in treatment 32 weeks. The wound is located on the Sacrum. The wound measures 0.9cm length x 0.8cm width x 0.9cm depth; 0.565cm^2 area and 0.509cm^3 volume. There is Fat Layer (Subcutaneous Tissue) exposed. There is no tunneling noted, however, there is undermining starting at 12:00 and ending at 12:00 with a maximum distance of 1cm. There is a medium amount of serosanguineous drainage noted. The wound  margin is epibole. There is large (67-100%) pink, pale granulation within the wound bed. There is a small (1-33%) amount of necrotic tissue within the wound bed including Adherent Slough. The periwound skin appearance had no abnormalities noted for color. The periwound skin appearance exhibited: Callus, Excoriation, Rash, Maceration. The periwound skin appearance did not exhibit: Dry/Scaly. Periwound temperature was noted as No Abnormality. Assessment Active Problems ICD-10 Pressure ulcer of sacral region, stage  4 Acquired absence of left leg below knee Acquired absence of right leg below knee Reller, Herbie [place] ([id number]) [id number] Page 6 of 7 Plan Follow-up Appointments: Return Appointment in 2 weeks. - Dr. Celine Ahr - room 1 Anesthetic: Wound #1 Sacrum: (In clinic) Topical Lidocaine 4% applied to wound bed Bathing/ Shower/ Hygiene: May shower with protection but do not get wound dressing(s) wet. Off-Loading: Gel mattress overlay (Group 1) Turn and reposition every 2 hours WOUND #1: - Sacrum Wound Laterality: Cleanser: Soap and Water 3 x Per Week/30 Days Discharge Instructions: May shower and wash wound with dial antibacterial soap and water prior to dressing change. Cleanser: Wound Cleanser 3 x Per Week/30 Days Discharge Instructions: Cleanse the wound with wound cleanser prior to applying a clean dressing using gauze sponges, not tissue or cotton balls. Peri-Wound Care: Skin Prep 3 x Per Week/30 Days Discharge Instructions: Use skin prep as directed Peri-Wound Care: Zinc Oxide Ointment 30g tube 3 x Per Week/30 Days Discharge Instructions: Apply Zinc Oxide to macerated periwound with each dressing change as needed Prim Dressing: Sorbalgon AG Dressing 2x2 (in/in) 3 x Per Week/30 Days ary Discharge Instructions: Apply to wound bed as instructed Secondary Dressing: Zetuvit Plus Silicone Border Sacrum Dressing, Sm, 7x7 (in/in) 3 x Per Week/30 Days Discharge Instructions: Apply silicone border over primary dressing as directed. Secured With: 108M Medipore H Soft Cloth Surgical T ape, 4 x 10 (in/yd) 3 x Per Week/30 Days Discharge Instructions: Secure with tape as directed. Add-Ons: Gloves, Large 3 x Per Week/30 Days 1. I continued with the same dressing which is silver alginate based it was started 2 weeks ago. Moistened silver collagen might be the next alternative 2. I did not think any mechanical debridement was necessary what I can see of the granulation of the  surface of the wound looked healthy. The challenge here is going to be to get that granulation tissue to fill in the rest of the orifice 3. He is already had a prolonged course of wound VAC ending in the summer and really is not interested in hearing any other discussion about wound vacs [snap VAC]. 1 would consider a skin substitute such as Oasis here to see if we can stimulate some granulation. Oasis has the benefit of being easily manipulated act after its been soaked in saline. 3. He claims to be rigorously offloading this area. Electronic Signature(s) Signed: 08/24/2022 5:06:55 PM By: Linton Ham MD Entered By: Linton Ham on 08/24/2022 12:38:13 -------------------------------------------------------------------------------- SuperBill Details Patient Name: Date of Service: [name], [place]onna 08/24/2022 Medical Record Number: [id number] Patient Account Number: [id number] Date of Birth/Sex: Treating RN: 02-Feb-1966 (56 y.o. 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

## 2022-08-26 ENCOUNTER — Encounter: Payer: Self-pay | Admitting: Physical Therapy

## 2022-08-26 ENCOUNTER — Ambulatory Visit (INDEPENDENT_AMBULATORY_CARE_PROVIDER_SITE_OTHER): Payer: Medicare HMO | Admitting: Physical Therapy

## 2022-08-26 ENCOUNTER — Telehealth: Payer: Self-pay | Admitting: Orthopedic Surgery

## 2022-08-26 DIAGNOSIS — R2681 Unsteadiness on feet: Secondary | ICD-10-CM | POA: Diagnosis not present

## 2022-08-26 DIAGNOSIS — R2689 Other abnormalities of gait and mobility: Secondary | ICD-10-CM

## 2022-08-26 DIAGNOSIS — Z7409 Other reduced mobility: Secondary | ICD-10-CM

## 2022-08-26 DIAGNOSIS — M6281 Muscle weakness (generalized): Secondary | ICD-10-CM

## 2022-08-26 DIAGNOSIS — M5459 Other low back pain: Secondary | ICD-10-CM | POA: Diagnosis not present

## 2022-08-26 NOTE — Therapy (Addendum)
PHYSICAL THERAPY DISCHARGE SUMMARY  Visits from Start of Care: 14  Current functional level related to goals / functional outcomes: Patient requested to be placed on hold until he got prosthetic fit issues resolved.  PT has not heard from this patient in 2 months so discharging.   See below for last known functional level.    Remaining deficits: See below for last known functional level.    Education / Equipment: Pt was educated in prosthetic care issues & HEP which appeared to understand.    Patient agrees to discharge. Patient goals were not met. Patient is being discharged due to not returning since the last visit.  Jamey Reas, PT, DPT PT Specializing in [place] 10/28/22 8:36 AM Phone:  [phone]  Fax:  [phone] [facility] [address] [place], [place] [address]   [facility] THERAPY TREATMENT NOTE   Patient Name: [name] MRN: [id number] DOB:1966/01/10, 56 y.o., male Today's Date: 08/26/2022  PCP: Loralee Pacas, MD REFERRING PROVIDER: Loralee Pacas, MD   END OF SESSION:   PT End of Session - 08/26/22 1304     Visit Number 14    Number of Visits 26    Date for PT Re-Evaluation 10/01/22    Authorization Type [employer]    Authorization Time Period [employer] NO COPAY DED MET AND OOP MET Approved Authorization [id number]  Tracking [id number] pt visits 12//-10/01/22    Authorization - Visit Number 2    Authorization - Number of Visits 12    Progress Note Due on Visit 20    PT Start Time 1301    PT Stop Time [id number]    PT Time Calculation (min) 44 min    Equipment Utilized During Treatment Gait belt    Activity Tolerance Patient tolerated treatment well;Patient limited by pain    Behavior During Therapy [facility] for tasks assessed/performed                     Past Medical History:  Diagnosis Date   Acquired complex renal cyst 09/25/2019   AKI (acute kidney injury) ([place]) 07/22/2021    Cancer ([place])    renal   Chest pain [id number]   Complication of anesthesia    Decubitus ulcer of sacral area 12/15/2021   Depression    Diabetes mellitus without complication ([place])    Diabetic infection of left foot ([place]) 04/17/2021   Episode of unresponsiveness 12/15/2021   Gas gangrene of foot ([place]) 07/22/2021   Healthcare maintenance 09/25/2019   Hematuria 09/25/2019   History of kidney stones    Lactic acidosis 12/15/2021   Leukocytosis 09/13/2021   Osteomyelitis ([place])    Sacral pressure injury of skin 07/27/2021   Septic shock ([place]) 07/22/2021   Severe sepsis with acute organ dysfunction ([place]) 07/22/2021   Shortness of breath 05/16/2021   Stage 3a chronic kidney disease ([place]) [id number]   Systolic dysfunction    Type 2 diabetes mellitus 10/12/2019   Past Surgical History:  Procedure Laterality Date   AMPUTATION Left 07/23/2021   Procedure: LEFT BELOW KNEE AMPUTATION;  Surgeon: [name], MD;  Location: [facility];  Service: Orthopedics;  Laterality: Left;   BELOW KNEE LEG AMPUTATION Right    BUBBLE STUDY  07/29/2021   Procedure: BUBBLE STUDY;  Surgeon: Sueanne Margarita, MD;  Location: [facility];  Service: Cardiovascular;;   CARDIOVERSION N/A 07/29/2021   Procedure: CARDIOVERSION;  Surgeon: Sueanne Margarita, MD;  Location: [facility];  Service: Cardiovascular;  Laterality: N/A;   RADIOLOGY WITH ANESTHESIA N/A 08/05/2021   Procedure: MRI LUMBAR WITH AND WITHOUT; THORASIC SPINE WITH AND WITHOUT WITH ANESTHESIA;  Surgeon: Radiologist, Medication, MD;  Location: [facility];  Service: Radiology;  Laterality: N/A;   RADIOLOGY WITH ANESTHESIA N/A 08/07/2021   Procedure: MRI WITH LUMBER WITH AND WITHOUT CONTRAST,THORACIC WITH AND WITHOUT CONTRAST;  Surgeon: Radiologist, Medication, MD;  Location: [facility];  Service: Radiology;  Laterality: N/A;   RADIOLOGY WITH ANESTHESIA N/A 09/13/2021   Procedure: MRI WITH ANESTHESIA;  Surgeon: Luanne Bras, MD;  Location: [facility];  Service:  Radiology;  Laterality: N/A;   TEE WITHOUT CARDIOVERSION N/A 07/29/2021   Procedure: TRANSESOPHAGEAL ECHOCARDIOGRAM (TEE);  Surgeon: Sueanne Margarita, MD;  Location[facility];  Service: Cardiovascular;  Laterality: N/A;   Patient Active Problem List   Diagnosis Date Noted   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[place]) 07/24/2022   Type 1 diabetes mellitus with complications ([place]) 123456   [place] 06/22/2022   Polycythemia 06/22/2022   Amputation below knee ([place]) 04/23/2022   PTSD (post-traumatic stress disorder) 04/23/2022   Generalized anxiety disorder 01/21/2022   Episode of unresponsiveness 12/15/2021   Acute respiratory failure with hypoxia (HCC) secondary to suspected aspiration pneumonia 12/15/2021   Renal mass 12/15/2021   Stage IV pressure ulcer of sacral region ([place]) 09/15/2021   Insomnia 123456   Chronic systolic CHF (congestive heart failure) ([place]) 09/14/2021   Hypogonadism in male 09/14/2021   Chronic pain 09/14/2021   Pseudohyponatremia 09/13/2021   Normocytic anemia 09/13/2021   Discitis of thoracic region    Paroxysmal atrial flutter ([place]) 07/22/2021   Chronic diastolic (congestive) heart failure ([place]) 05/16/2021   Lumbar spondylosis 03/25/2021   H/O amputation of leg through tibia and fibula ([place]) 11/10/2019   Degeneration of lumbar intervertebral disc 11/10/2019   Hypercholesterolemia 11/10/2019   Left below-knee amputee ([place]) 10/30/2019   Dyslipidemia 10/30/2019   Type 1 diabetes mellitus with diabetic polyneuropathy, with long-term current use of insulin ([place]) 10/12/2019   Right below-elbow amputee 10/12/2019   Hypertensive disorder 09/25/2019   Urinary hesitancy 09/25/2019   Prosthesis adjustments 09/25/2019   Erectile dysfunction 09/25/2019   Palliative care status 09/25/2019   Pulmonary nodules 09/25/2019   Depressive disorder 09/25/2019    REFERRING DIAG: G89.4 (ICD-10-CM) - Chronic pain syndrome   Z89.512 Left Below Knee Amputation, Z89.511 history of  right Below Knee Amputation.  THERAPY DIAG:  Other abnormalities of gait and mobility  Unsteadiness on feet  Muscle weakness (generalized)  Impaired functional mobility and activity tolerance  Other low back pain  Rationale for Evaluation and Treatment Rehabilitation  PERTINENT HISTORY:  DM2, CHF, HTN, chronic pain, bilateral TTAs  PRECAUTIONS: Fall  SUBJECTIVE:  SUBJECTIVE STATEMENT:  He is getting thicker liners.  He has not worn prostheses for 5 days until yesterday.  His back is killing him & limiting his mobility.  The sacral wound is slowly improving. He is still has to stay off buttocks so   PAIN:  Are you having pain? Are you having pain? Yes: NPRS scale: today 9/10 and in last week ranging 7-10/10 Pain location: middle low back Pain description: sharp Aggravating factors: extension Relieving factors: sitting forward,   OBJECTIVE: (objective measures completed at initial evaluation unless otherwise dated)   OBJECTIVE:  COGNITION: Overall cognitive status: Within functional limits for tasks assessed   POSTURE: rounded shoulders, forward head, flexed trunk , and weight shift left   LOWER EXTREMITY ROM:   ROM P:passive  A:active Right eval Left eval  Hip flexion      Hip extension Standing A: -15* Standing A: -15*  Hip abduction      Hip adduction      Hip internal rotation      Hip external rotation      Knee flexion      Knee extension      Ankle dorsiflexion      Ankle plantarflexion      Ankle inversion      Ankle eversion       (Blank rows = not tested)   LOWER EXTREMITY MMT:   MMT Right eval Left eval  Hip flexion 4/5 4/5  Hip extension 3-/5 3-/5  Hip abduction 3/5 3/5  Hip adduction      Hip internal rotation      Hip external rotation      Knee flexion  4/5 4/5  Knee extension 4/5 4/5  Ankle dorsiflexion NA NA  Ankle plantarflexion NA NA  Ankle inversion NA NA  Ankle eversion NA NA  (Blank rows = not tested)   TRANSFERS: Sit to stand: SBA 22" w/c requiring armrests to RW for support for stabilization with poor technique.  Stand to sit: SBA requires RW support to 22" w/c requiring armrests with poor technique.    GAIT: Gait pattern: step to pattern, decreased step length- Right, decreased stance time- Left, Left hip hike, antalgic, trunk flexed, and wide BOS Distance walked: 73' with 90* turn right & left and 180* turn Assistive device utilized: Environmental consultant - 2 wheeled and TTA prosthesis Level of assistance: Min A esp with turns Comments: excessive BUE weight bearing on RW   FUNCTIONAL TESTs:  Berg Balance Scale: 10/56   [facility] PT Assessment - 07/06/22 1300                Standardized Balance Assessment    Standardized Balance Assessment Berg Balance Test          Berg Balance Test    Sit to Stand Needs minimal aid to stand or to stabilize     Standing Unsupported Needs several tries to stand 30 seconds unsupported     Sitting with Back Unsupported but Feet Supported on Floor or Stool Able to sit safely and securely 2 minutes     Stand to Sit Sits independently, has uncontrolled descent     Transfers Able to transfer safely, definite need of hands     Standing Unsupported with Eyes Closed Needs help to keep from falling     Standing Unsupported with Feet Together Needs help to attain position and unable to hold for 15 seconds     From Standing, Reach Forward with Outstretched Arm Loses balance  while trying/requires external support     From Standing Position, Pick up Object from Floor Unable to try/needs assist to keep balance     From Standing Position, Turn to Look Behind Over each Shoulder Needs assist to keep from losing balance and falling     Turn 360 Degrees Needs assistance while turning     Standing Unsupported, Alternately  Place Feet on Step/Stool Needs assistance to keep from falling or unable to try     Standing Unsupported, One Foot in [employer] balance while stepping or standing     Standing on One Leg Unable to try or needs assist to prevent fall     Total Score 10     Berg comment: BERG  < 36 high risk for falls (close to 100%) 46-51 moderate (>50%)   37-45 significant (>80%) 52-55 lower (> 25%)                   CURRENT PROSTHETIC WEAR ASSESSMENT: Patient is dependent with: skin check, residual limb care, prosthetic cleaning, ply sock cleaning, correct ply sock adjustment, proper wear schedule/adjustment, and proper weight-bearing schedule/adjustment Donning prosthesis: SBA / verbal cues Doffing prosthesis: SBA / verbal cues Prosthetic wear tolerance: liner & flexible suction socket for most of awake hours and wears prostheses maybe ~40% of awake hours,  7 days/week Prosthetic weight bearing tolerance: 5 minutes Edema: none noted Residual limb condition: bilateral redness over patella, dry skin, normal color & temperature.  Prosthetic description: suction pin suspension with 51m silicon liner as interface & flexible inner socket, total contact socket, dynamic response Flex feet       TODAY'S TREATMENT:                                                                                                                             DATE:  08/26/2022: Therapeutic Exercises:  Standing with elbows on wall stretching into back extension hold 15 sec 5 reps Standing at sink with upright posture: alternating LE hip ext 10 reps. Standing at sink with upright posture: alternating LE hip abd 10 reps. Standing with posterior pelvis to counter for upright posture. Stance for 2 min.  Sidelying PNF pelvic depression hip ext and UE elevation upward reach. On right side, LLE reaching to 5:00 and LUE to 11:00.  On left side, RLE reaching to 7:00 and RUE to 1:00 pt performed 10 reps ea LE.   Prosthetic training with  bilateral below the knee amputation prostheses: Pt amb 150' with forearm crutches with 4-point pattern with supervision.  Verbal cues on upright posture, looking forward to facilitate.    08/19/2022: Prosthetic training with bilateral below the knee amputation prostheses: Sit to/from stand from wheelchair to forearm crutches with supervision and verbal cues. And from chairs without armrests using UEs with minA. Patient ambulated 150 feet with forearm crutches with supervision.  Step up & down 4" step with sink & single forearm crutch support 2 reps ea  LE. PT cued as HEP including turning around switching single crutch to other UE using counter. Pt return demo & verbalized understanding.  Patient negotiated 12 degree ramp with forearm crutches with min assist and verbal cues. Patient negotiated 4 inch curb with forearm crutches with moderate assistance second person available for safety with verbal and demo cues for technique. PT reviewed walking program short walk room to room with RW so can release for standing ADLs, long walk (max tolerable) with RW for support and medium (distance between short & long) with forearm crutches with supervision. ONLY use forearm crutches on level surfaces. Pt verbalized understanding.   Pt's back pain & trunk weakness are limiting his mobility. PT discussed exercises prone & quadruped but he reports unable to get in or out of this position including prone.   08/17/2022: Prosthetic training with bilateral below the knee amputation prostheses: Sit to/from stand from wheelchair to forearm crutches with supervision and verbal cues Patient ambulated 250 feet with forearm crutches with supervision.  Standing with upright posture with posterior pelvis to counter for 3 min. Step up & down 4" step with sink & single forearm crutch support 5 reps ea LE. PT cued as HEP but needs one more session prior to trying outside of PT. Patient negotiated 12 degree ramp with forearm  crutches with min assist and verbal cues. Patient negotiated 6 inch curb with forearm crutches with moderate assistance second person available for safety with verbal and demo cues for technique.   HOME EXERCISE PROGRAM:   Access Code: [email or web address] URL: [email or web address] Date: 07/29/2022 Prepared by: Jamey Reas  Exercises - standing on floor eyes open head movements  - 1 x daily - 7 x weekly - 1 sets - 5-10 reps - 5 seconds hold - standing on floor head movements eyes closed  - 1 x daily - 7 x weekly - 1 sets - 5-10 reps - 2 seconds hold - stand on foam eyes open head movements  - 1 x daily - 7 x weekly - 1 sets - 5-10 reps - 5 seconds hold  Access Code: [email or web address] URL: [email or web address] Date: 08/03/2022 Prepared by: Jamey Reas  Exercises - Sit to Stand with Counter Support  - 2-3 x daily - 7 x weekly - 1 sets - 10 reps - 5 seconds hold - Seated Hamstring Stretch with Strap  - 2-3 x daily - 7 x weekly - 1 sets - 2-3 reps - 20-30 seconds hold - Alternating Punch with Resistance  - 1 x daily - 5 x weekly - 1 sets - 10 reps - 5 seconds hold - Standing Single Arm Shoulder Flexion with Posterior Anchored Resistance  - 1 x daily - 5 x weekly - 1 sets - 10 reps - 5 seconds hold - Standing Scapular Protraction with Resistance  - 1 x daily - 7 x weekly - 1 sets - 10 reps - 5 seconds hold - Standing alternate rows with resistance  - 1 x daily - 7 x weekly - 1 sets - 10 reps - 5 seconds hold - Single Arm Shoulder Extension with Anchored Resistance  - 1 x daily - 5 x weekly - 1 sets - 10 reps - 5 seconds hold - Standing Row with Anchored Resistance  - 1 x daily - 7 x weekly - 1 sets - 10 reps - 5 seconds hold - Alternating elbow flexion with resistance  - 1 x daily - 7 x weekly - 1 sets - 10 reps - 5  seconds hold - Standing Bicep Curls with Resistance  - 1 x daily - 7 x weekly - 1 sets - 10 reps - 5 seconds hold   Do each exercise 1-2  times per day Do each  exercise 5-10 repetitions Hold each exercise for 2 seconds to feel your location  AT [place].  Try to find this position when standing still for activities.   USE TAPE ON FLOOR TO MARK THE MIDLINE POSITION which is even with middle of sink.  You also should try to feel with your limb pressure in socket.  You are trying to feel with limb what you used to feel with the bottom of your foot.  Side to Side Shift: Moving your hips only (not shoulders): move weight onto your left leg, HOLD/FEEL pressure in socket.  Move back to equal weight on each leg, HOLD/FEEL pressure in socket. Move weight onto your right leg, HOLD/FEEL pressure in socket. Move back to equal weight on each leg, HOLD/FEEL pressure in socket. Repeat.  Start with both hands on sink, progress to right hand only hold, then left hand only hold  Front to Back Shift: Moving your hips only (not shoulders): move your weight forward onto your toes, HOLD/FEEL pressure in socket. Move your weight back to equal Flat Foot on both legs, HOLD/FEEL  pressure in socket. Move your weight back onto your heels, HOLD/FEEL  pressure in socket. Move your weight back to equal on both legs, HOLD/FEEL  pressure in socket. Repeat.  Start with both hands on sink, progress to right hand only hold, then left hand only hold  Moving Cones / Cups: With equal weight on each leg: Hold on with one hand the first time, then progress to no hand supports. Move cups from one side of sink to the other. Place cups ~2" out of your reach, progress to 10" beyond reach.  Place one hand in middle of sink and reach with other hand. Do both arms.   Overhead/Upward Reaching: alternated reaching up to top cabinets or ceiling if no cabinets present. Keep equal weight on each leg. Start with one hand support on counter while other hand reaches and progress to no hand support with reaching.  ace one hand in middle of sink and  reach with other hand. Do both arms.   5.   Looking Over Shoulders: With equal weight on each leg: alternate turning to look over your shoulders with one hand support on counter as needed.  Start with head motions only to look in front of shoulder, then even with shoulder and progress to looking behind you. To look to side, move head /eyes, then shoulder on side looking pulls back, shift more weight to side looking and pull hip back. Place one hand in middle of sink and let go with other hand so your shoulder can pull back. Switch hands to look other way.   6.  Alternating Stepping or tapping toes into lower cabinet:  Move items under cabinet out of your way. Shift your hips/pelvis so weight on right leg & Tighten muscles in hip on right side.  SLOWLY step left leg so front of foot is in cabinet. Then step back to floor with right hip tight.  Repeat with other leg.     ASSESSMENT:   CLINICAL IMPRESSION: Back pain is significantly limiting this patient.  The fact that he has to stay off sacral wound for majority  of day limits upright. So when he has to be upright his back fatigues quickly.     OBJECTIVE IMPAIRMENTS: Abnormal gait, decreased activity tolerance, decreased balance, decreased knowledge of use of DME, decreased mobility, decreased ROM, decreased strength, impaired flexibility, postural dysfunction, prosthetic dependency , and pain.    ACTIVITY LIMITATIONS: carrying, lifting, bending, sitting, standing, squatting, stairs, transfers, and locomotion level   PARTICIPATION LIMITATIONS: meal prep, cleaning, driving, and community activity   PERSONAL FACTORS: Fitness, Time since onset of injury/illness/exacerbation, and 3+ comorbidities: see PMH  are also affecting patient's functional outcome.    REHAB POTENTIAL: Good   CLINICAL DECISION MAKING: Evolving/moderate complexity   EVALUATION COMPLEXITY: Moderate     GOALS: Goals reviewed with patient? Yes   SHORT TERM GOALS: Target date:  09/03/2022   Patient verbalizes how to adjust ply fit with limb volume changes. Baseline: SEE OBJECTIVE DATA Goal status: ongoing 08/17/2022 2.  Patient verbalizes understanding of updated HEP.  Baseline: SEE OBJECTIVE DATA Goal status: ongoing 08/17/2022   3.  Patient able to transfer sit to stand chairs without armrests & stabilize without external support. Baseline: SEE OBJECTIVE DATA Goal status:  ongoing 08/17/2022   4. Patient ambulates 200' with crutches & prosthesis with supervision. Baseline: SEE OBJECTIVE DATA Goal status:  ongoing 08/17/2022   5. Patient negotiates ramps & curbs with crutches & prosthesis with supervision Baseline: SEE OBJECTIVE DATA Goal status:  ongoing 08/17/2022   LONG TERM GOALS: Target date: 10/01/2022   Patient demonstrates & verbalized understanding of prosthetic care to enable safe utilization of prosthesis. Baseline: SEE OBJECTIVE DATA Goal status: ongoing 08/17/2022   Patient tolerates prostheses wear >90% of awake hours without skin or limb pain issues. Baseline: SEE OBJECTIVE DATA Goal status: ongoing 08/17/2022   Berg Balance >/= 45/56 to indicate lower fall risk Baseline: SEE OBJECTIVE DATA Goal status: ongoing 08/17/2022   Patient ambulates >300' with prostheses and cane or less independently Baseline: SEE OBJECTIVE DATA Goal status: ongoing 08/17/2022   Patient negotiates ramps, curbs & stairs with single rail with prostheses and cane or less independently. Baseline: SEE OBJECTIVE DATA Goal status: ongoing 08/17/2022   6.   Patient verbalizes & demonstrates understanding of how to properly use fitness equipment to return to gym. Baseline: SEE OBJECTIVE DATA Goal status: ongoing 08/17/2022   PLAN:   PT FREQUENCY: 2x/week   PT DURATION: 90 days / 13 weeks   PLANNED INTERVENTIONS: Therapeutic exercises, Therapeutic activity, Neuromuscular re-education, Balance training, Gait training, Patient/Family education, Self Care,  Stair training, Prosthetic training, DME instructions, Electrical stimulation, Cryotherapy, Moist heat, and physical performance testing.   PLAN FOR NEXT SESSION:  check STGs, check updated HEP for back, Prosthetic gait with forearm crutches including ramps and curbs, try lateral step up on 4" block at sink.    Jamey Reas, PT, DPT 08/26/2022, 4:21 PM

## 2022-08-26 NOTE — Telephone Encounter (Signed)
Can you please call this pt and make an appt for the next available visit with Erin. He needs prosthetic and insurance will require that he is seen in the office with in 6 months and he last visit was 10/2021

## 2022-08-27 DIAGNOSIS — N281 Cyst of kidney, acquired: Secondary | ICD-10-CM | POA: Diagnosis not present

## 2022-08-27 DIAGNOSIS — N401 Enlarged prostate with lower urinary tract symptoms: Secondary | ICD-10-CM | POA: Diagnosis not present

## 2022-08-28 ENCOUNTER — Telehealth: Payer: Self-pay | Admitting: Internal Medicine

## 2022-08-28 DIAGNOSIS — N281 Cyst of kidney, acquired: Secondary | ICD-10-CM | POA: Diagnosis not present

## 2022-08-28 DIAGNOSIS — N289 Disorder of kidney and ureter, unspecified: Secondary | ICD-10-CM | POA: Diagnosis not present

## 2022-08-28 NOTE — Telephone Encounter (Signed)
  LAST APPOINTMENT DATE:  07/24/22  NEXT APPOINTMENT DATE: 12/23/22  MEDICATION: EZ touch needles 27Gx 1cc  Is the patient out of medication? Yes  PHARMACY: [facility] ([place]), [place] - [address] [facility] [place] [address] Phone: [phone]  Fax: [phone]

## 2022-09-01 ENCOUNTER — Other Ambulatory Visit: Payer: Self-pay

## 2022-09-02 ENCOUNTER — Ambulatory Visit: Payer: Medicare HMO | Admitting: Internal Medicine

## 2022-09-02 ENCOUNTER — Encounter: Payer: Medicare HMO | Admitting: Physical Therapy

## 2022-09-02 ENCOUNTER — Ambulatory Visit: Payer: Medicare HMO | Admitting: Family

## 2022-09-02 NOTE — Therapy (Incomplete)
OUTPATIENT PHYSICAL THERAPY TREATMENT NOTE   Patient Name: [name] MRN: [id number] DOB:08-12-1966, 56 y.o., male Today's Date: 08/26/2022  PCP: Loralee Pacas, MD REFERRING PROVIDER: Loralee Pacas, MD   END OF SESSION:   PT End of Session - 08/26/22 1304     Visit Number 14    Number of Visits 26    Date for PT Re-Evaluation 10/01/22    Authorization Type [employer]    Authorization Time Period [employer] NO COPAY DED MET AND OOP MET Approved Authorization #[id number]  Tracking #[id number] pt visits 12//-10/01/22    Authorization - Visit Number 2    Authorization - Number of Visits 12    Progress Note Due on Visit 20    PT Start Time 1301    PT Stop Time 9826    PT Time Calculation (min) 44 min    Equipment Utilized During Treatment Gait belt    Activity Tolerance Patient tolerated treatment well;Patient limited by pain    Behavior During Therapy [facility] for tasks assessed/performed                     Past Medical History:  Diagnosis Date   Acquired complex renal cyst 09/25/2019   AKI (acute kidney injury) ([place]) 07/22/2021   Cancer ([place])    renal   Chest pain 41/58/3094   Complication of anesthesia    Decubitus ulcer of sacral area 12/15/2021   Depression    Diabetes mellitus without complication ([place])    Diabetic infection of left foot ([place]) 04/17/2021   Episode of unresponsiveness 12/15/2021   Gas gangrene of foot ([place]) 07/22/2021   Healthcare maintenance 09/25/2019   Hematuria 09/25/2019   History of kidney stones    Lactic acidosis 12/15/2021   Leukocytosis 09/13/2021   Osteomyelitis ([place])    Sacral pressure injury of skin 07/27/2021   Septic shock ([place]) 07/22/2021   Severe sepsis with acute organ dysfunction ([place]) 07/22/2021   Shortness of breath 05/16/2021   Stage 3a chronic kidney disease ([place]) 07/68/0881   Systolic dysfunction    Type 2 diabetes mellitus 10/12/2019   Past Surgical History:  Procedure Laterality Date   AMPUTATION Left  07/23/2021   Procedure: LEFT BELOW KNEE AMPUTATION;  Surgeon: [name], MD;  Location: [place];  Service: Orthopedics;  Laterality: Left;   BELOW KNEE LEG AMPUTATION Right    BUBBLE STUDY  07/29/2021   Procedure: BUBBLE STUDY;  Surgeon: Sueanne Margarita, MD;  Location: [place];  Service: Cardiovascular;;   CARDIOVERSION N/A 07/29/2021   Procedure: CARDIOVERSION;  Surgeon: Sueanne Margarita, MD;  Location: [facility] ENDOSCOPY;  Service: Cardiovascular;  Laterality: N/A;   RADIOLOGY WITH ANESTHESIA N/A 08/05/2021   Procedure: MRI LUMBAR WITH AND WITHOUT; THORASIC SPINE WITH AND WITHOUT WITH ANESTHESIA;  Surgeon: Radiologist, Medication, MD;  Location: [place];  Service: Radiology;  Laterality: N/A;   RADIOLOGY WITH ANESTHESIA N/A 08/07/2021   Procedure: MRI WITH LUMBER WITH AND WITHOUT CONTRAST,THORACIC WITH AND WITHOUT CONTRAST;  Surgeon: Radiologist, Medication, MD;  Location: [place];  Service: Radiology;  Laterality: N/A;   RADIOLOGY WITH ANESTHESIA N/A 09/13/2021   Procedure: MRI WITH ANESTHESIA;  Surgeon: Luanne Bras, MD;  Location: [place];  Service: Radiology;  Laterality: N/A;   TEE WITHOUT CARDIOVERSION N/A 07/29/2021   Procedure: TRANSESOPHAGEAL ECHOCARDIOGRAM (TEE);  Surgeon: Sueanne Margarita, MD;  Location: [facility] ENDOSCOPY;  Service: Cardiovascular;  Laterality: N/A;   Patient Active Problem List   Diagnosis Date Noted  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[place]) 07/24/2022   Type 1 diabetes mellitus with complications ([place]) 76/81/1572   Malaise 06/22/2022   Polycythemia 06/22/2022   Amputation below knee ([place]) 04/23/2022   PTSD (post-traumatic stress disorder) 04/23/2022   Generalized anxiety disorder 01/21/2022   Episode of unresponsiveness 12/15/2021   Acute respiratory failure with hypoxia (HCC) secondary to suspected aspiration pneumonia 12/15/2021   Renal mass 12/15/2021   Stage IV pressure ulcer of sacral region ([place]) 09/15/2021   Insomnia 62/11/5595   Chronic systolic CHF (congestive  heart failure) ([place]) 09/14/2021   Hypogonadism in male 09/14/2021   Chronic pain 09/14/2021   Pseudohyponatremia 09/13/2021   Normocytic anemia 09/13/2021   Discitis of thoracic region    Paroxysmal atrial flutter ([place]) 07/22/2021   Chronic diastolic (congestive) heart failure ([place]) 05/16/2021   Lumbar spondylosis 03/25/2021   H/O amputation of leg through tibia and fibula ([place]) 11/10/2019   Degeneration of lumbar intervertebral disc 11/10/2019   Hypercholesterolemia 11/10/2019   Left below-knee amputee ([place]) 10/30/2019   Dyslipidemia 10/30/2019   Type 1 diabetes mellitus with diabetic polyneuropathy, with long-term current use of insulin ([place]) 10/12/2019   Right below-elbow amputee 10/12/2019   Hypertensive disorder 09/25/2019   Urinary hesitancy 09/25/2019   Prosthesis adjustments 09/25/2019   Erectile dysfunction 09/25/2019   Palliative care status 09/25/2019   Pulmonary nodules 09/25/2019   Depressive disorder 09/25/2019    REFERRING DIAG: G89.4 (ICD-10-CM) - Chronic pain syndrome   Z89.512 Left Below Knee Amputation, Z89.511 history of right Below Knee Amputation.  THERAPY DIAG:  Other abnormalities of gait and mobility  Unsteadiness on feet  Muscle weakness (generalized)  Impaired functional mobility and activity tolerance  Other low back pain  Rationale for Evaluation and Treatment Rehabilitation  PERTINENT HISTORY:  DM2, CHF, HTN, chronic pain, bilateral TTAs  PRECAUTIONS: Fall  SUBJECTIVE:                                                                                                                                                                                      SUBJECTIVE STATEMENT:  *** He is getting thicker liners.  He has not worn prostheses for 5 days until yesterday.  His back is killing him & limiting his mobility.  The sacral wound is slowly improving. He is still has to stay off buttocks so   PAIN:  Are you having pain? Are you having pain?  Yes: NPRS scale: today *** 9/10 and in last week ranging 7-10/10 Pain location: middle low back Pain description: sharp Aggravating factors: extension Relieving factors: sitting forward,   OBJECTIVE: (objective measures completed at initial evaluation unless otherwise dated)   OBJECTIVE:  COGNITION: Overall cognitive  status: Within functional limits for tasks assessed   POSTURE: rounded shoulders, forward head, flexed trunk , and weight shift left   LOWER EXTREMITY ROM:   ROM P:passive  A:active Right eval Left eval  Hip flexion      Hip extension Standing A: -15* Standing A: -15*  Hip abduction      Hip adduction      Hip internal rotation      Hip external rotation      Knee flexion      Knee extension      Ankle dorsiflexion      Ankle plantarflexion      Ankle inversion      Ankle eversion       (Blank rows = not tested)   LOWER EXTREMITY MMT:   MMT Right eval Left eval  Hip flexion 4/5 4/5  Hip extension 3-/5 3-/5  Hip abduction 3/5 3/5  Hip adduction      Hip internal rotation      Hip external rotation      Knee flexion 4/5 4/5  Knee extension 4/5 4/5  Ankle dorsiflexion NA NA  Ankle plantarflexion NA NA  Ankle inversion NA NA  Ankle eversion NA NA  (Blank rows = not tested)   TRANSFERS: Sit to stand: SBA 22" w/c requiring armrests to RW for support for stabilization with poor technique.  Stand to sit: SBA requires RW support to 22" w/c requiring armrests with poor technique.    GAIT: Gait pattern: step to pattern, decreased step length- Right, decreased stance time- Left, Left hip hike, antalgic, trunk flexed, and wide BOS Distance walked: 77' with 90* turn right & left and 180* turn Assistive device utilized: Environmental consultant - 2 wheeled and TTA prosthesis Level of assistance: Min A esp with turns Comments: excessive BUE weight bearing on RW   FUNCTIONAL TESTs:  Berg Balance Scale: 10/56   [facility] PT Assessment - 07/06/22 1300                 Standardized Balance Assessment    Standardized Balance Assessment Berg Balance Test          Berg Balance Test    Sit to Stand Needs minimal aid to stand or to stabilize     Standing Unsupported Needs several tries to stand 30 seconds unsupported     Sitting with Back Unsupported but Feet Supported on Floor or Stool Able to sit safely and securely 2 minutes     Stand to Sit Sits independently, has uncontrolled descent     Transfers Able to transfer safely, definite need of hands     Standing Unsupported with Eyes Closed Needs help to keep from falling     Standing Unsupported with Feet Together Needs help to attain position and unable to hold for 15 seconds     From Standing, Reach Forward with Outstretched Arm Loses balance while trying/requires external support     From Standing Position, Pick up Object from Floor Unable to try/needs assist to keep balance     From Standing Position, Turn to Look Behind Over each Shoulder Needs assist to keep from losing balance and falling     Turn 360 Degrees Needs assistance while turning     Standing Unsupported, Alternately Place Feet on Step/Stool Needs assistance to keep from falling or unable to try     Standing Unsupported, One Foot in [employer] balance while stepping or standing     Standing on One Leg  Unable to try or needs assist to prevent fall     Total Score 10     Berg comment: BERG  < 36 high risk for falls (close to 100%) 46-51 moderate (>50%)   37-45 significant (>80%) 52-55 lower (> 25%)                   CURRENT PROSTHETIC WEAR ASSESSMENT: Patient is dependent with: skin check, residual limb care, prosthetic cleaning, ply sock cleaning, correct ply sock adjustment, proper wear schedule/adjustment, and proper weight-bearing schedule/adjustment Donning prosthesis: SBA / verbal cues Doffing prosthesis: SBA / verbal cues Prosthetic wear tolerance: liner & flexible suction socket for most of awake hours and wears prostheses maybe  ~40% of awake hours,  7 days/week Prosthetic weight bearing tolerance: 5 minutes Edema: none noted Residual limb condition: bilateral redness over patella, dry skin, normal color & temperature.  Prosthetic description: suction pin suspension with 63m silicon liner as interface & flexible inner socket, total contact socket, dynamic response Flex feet       TODAY'S TREATMENT:                                                                                                                             DATE:  09/02/2022: ***  08/26/2022: Therapeutic Exercises:  Standing with elbows on wall stretching into back extension hold 15 sec 5 reps Standing at sink with upright posture: alternating LE hip ext 10 reps. Standing at sink with upright posture: alternating LE hip abd 10 reps. Standing with posterior pelvis to counter for upright posture. Stance for 2 min.  Sidelying PNF pelvic depression hip ext and UE elevation upward reach. On right side, LLE reaching to 5:00 and LUE to 11:00.  On left side, RLE reaching to 7:00 and RUE to 1:00 pt performed 10 reps ea LE.   Prosthetic training with bilateral below the knee amputation prostheses: Pt amb 150' with forearm crutches with 4-point pattern with supervision.  Verbal cues on upright posture, looking forward to facilitate.    08/19/2022: Prosthetic training with bilateral below the knee amputation prostheses: Sit to/from stand from wheelchair to forearm crutches with supervision and verbal cues. And from chairs without armrests using UEs with minA. Patient ambulated 150 feet with forearm crutches with supervision.  Step up & down 4" step with sink & single forearm crutch support 2 reps ea LE. PT cued as HEP including turning around switching single crutch to other UE using counter. Pt return demo & verbalized understanding.  Patient negotiated 12 degree ramp with forearm crutches with min assist and verbal cues. Patient negotiated 4 inch curb with  forearm crutches with moderate assistance second person available for safety with verbal and demo cues for technique. PT reviewed walking program short walk room to room with RW so can release for standing ADLs, long walk (max tolerable) with RW for support and medium (distance between short & long) with forearm crutches with supervision. ONLY use  forearm crutches on level surfaces. Pt verbalized understanding.   Pt's back pain & trunk weakness are limiting his mobility. PT discussed exercises prone & quadruped but he reports unable to get in or out of this position including prone.    HOME EXERCISE PROGRAM:  Access Code: [email or web address] URL: [email or web address] Date: 07/29/2022 Prepared by: Jamey Reas  Exercises - standing on floor eyes open head movements  - 1 x daily - 7 x weekly - 1 sets - 5-10 reps - 5 seconds hold - standing on floor head movements eyes closed  - 1 x daily - 7 x weekly - 1 sets - 5-10 reps - 2 seconds hold - stand on foam eyes open head movements  - 1 x daily - 7 x weekly - 1 sets - 5-10 reps - 5 seconds hold  Access Code: [email or web address] URL: [email or web address] Date: 08/03/2022 Prepared by: Jamey Reas  Exercises - Sit to Stand with Counter Support  - 2-3 x daily - 7 x weekly - 1 sets - 10 reps - 5 seconds hold - Seated Hamstring Stretch with Strap  - 2-3 x daily - 7 x weekly - 1 sets - 2-3 reps - 20-30 seconds hold - Alternating Punch with Resistance  - 1 x daily - 5 x weekly - 1 sets - 10 reps - 5 seconds hold - Standing Single Arm Shoulder Flexion with Posterior Anchored Resistance  - 1 x daily - 5 x weekly - 1 sets - 10 reps - 5 seconds hold - Standing Scapular Protraction with Resistance  - 1 x daily - 7 x weekly - 1 sets - 10 reps - 5 seconds hold - Standing alternate rows with resistance  - 1 x daily - 7 x weekly - 1 sets - 10 reps - 5 seconds hold - Single Arm Shoulder Extension with Anchored Resistance  - 1 x daily - 5 x weekly -  1 sets - 10 reps - 5 seconds hold - Standing Row with Anchored Resistance  - 1 x daily - 7 x weekly - 1 sets - 10 reps - 5 seconds hold - Alternating elbow flexion with resistance  - 1 x daily - 7 x weekly - 1 sets - 10 reps - 5 seconds hold - Standing Bicep Curls with Resistance  - 1 x daily - 7 x weekly - 1 sets - 10 reps - 5 seconds hold   Do each exercise 1-2  times per day Do each exercise 5-10 repetitions Hold each exercise for 2 seconds to feel your location  AT [place].  Try to find this position when standing still for activities.   USE TAPE ON FLOOR TO MARK THE MIDLINE POSITION which is even with middle of sink.  You also should try to feel with your limb pressure in socket.  You are trying to feel with limb what you used to feel with the bottom of your foot.  Side to Side Shift: Moving your hips only (not shoulders): move weight onto your left leg, HOLD/FEEL pressure in socket.  Move back to equal weight on each leg, HOLD/FEEL pressure in socket. Move weight onto your right leg, HOLD/FEEL pressure in socket. Move back to equal weight on each leg, HOLD/FEEL pressure in socket. Repeat.  Start with both hands on sink, progress to right hand only hold, then left hand only hold  Front to Back Shift: Moving your hips only (not shoulders): move  your weight forward onto your toes, HOLD/FEEL pressure in socket. Move your weight back to equal Flat Foot on both legs, HOLD/FEEL  pressure in socket. Move your weight back onto your heels, HOLD/FEEL  pressure in socket. Move your weight back to equal on both legs, HOLD/FEEL  pressure in socket. Repeat.  Start with both hands on sink, progress to right hand only hold, then left hand only hold  Moving Cones / Cups: With equal weight on each leg: Hold on with one hand the first time, then progress to no hand supports. Move cups from one side of sink to the other. Place cups ~2" out of  your reach, progress to 10" beyond reach.  Place one hand in middle of sink and reach with other hand. Do both arms.   Overhead/Upward Reaching: alternated reaching up to top cabinets or ceiling if no cabinets present. Keep equal weight on each leg. Start with one hand support on counter while other hand reaches and progress to no hand support with reaching.  ace one hand in middle of sink and reach with other hand. Do both arms.   5.   Looking Over Shoulders: With equal weight on each leg: alternate turning to look over your shoulders with one hand support on counter as needed.  Start with head motions only to look in front of shoulder, then even with shoulder and progress to looking behind you. To look to side, move head /eyes, then shoulder on side looking pulls back, shift more weight to side looking and pull hip back. Place one hand in middle of sink and let go with other hand so your shoulder can pull back. Switch hands to look other way.   6.  Alternating Stepping or tapping toes into lower cabinet:  Move items under cabinet out of your way. Shift your hips/pelvis so weight on right leg & Tighten muscles in hip on right side.  SLOWLY step left leg so front of foot is in cabinet. Then step back to floor with right hip tight.  Repeat with other leg.     ASSESSMENT:   CLINICAL IMPRESSION: *** Back pain is significantly limiting this patient.  The fact that he has to stay off sacral wound for majority of day limits upright. So when he has to be upright his back fatigues quickly.     OBJECTIVE IMPAIRMENTS: Abnormal gait, decreased activity tolerance, decreased balance, decreased knowledge of use of DME, decreased mobility, decreased ROM, decreased strength, impaired flexibility, postural dysfunction, prosthetic dependency , and pain.    ACTIVITY LIMITATIONS: carrying, lifting, bending, sitting, standing, squatting, stairs, transfers, and locomotion level   PARTICIPATION LIMITATIONS: meal prep,  cleaning, driving, and community activity   PERSONAL FACTORS: Fitness, Time since onset of injury/illness/exacerbation, and 3+ comorbidities: see PMH  are also affecting patient's functional outcome.    REHAB POTENTIAL: Good   CLINICAL DECISION MAKING: Evolving/moderate complexity   EVALUATION COMPLEXITY: Moderate     GOALS: Goals reviewed with patient? Yes   SHORT TERM GOALS: Target date: 09/03/2022   Patient verbalizes how to adjust ply fit with limb volume changes. Baseline: SEE OBJECTIVE DATA Goal status: ongoing 08/17/2022 2.  Patient verbalizes understanding of updated HEP.  Baseline: SEE OBJECTIVE DATA Goal status: ongoing 08/17/2022   3.  Patient able to transfer sit to stand chairs without armrests & stabilize without external support. Baseline: SEE OBJECTIVE DATA Goal status:  ongoing 08/17/2022   4. Patient ambulates 200' with crutches & prosthesis with supervision. Baseline: SEE  OBJECTIVE DATA Goal status:  ongoing 08/17/2022   5. Patient negotiates ramps & curbs with crutches & prosthesis with supervision Baseline: SEE OBJECTIVE DATA Goal status:  ongoing 08/17/2022   LONG TERM GOALS: Target date: 10/01/2022   Patient demonstrates & verbalized understanding of prosthetic care to enable safe utilization of prosthesis. Baseline: SEE OBJECTIVE DATA Goal status: ongoing 08/17/2022   Patient tolerates prostheses wear >90% of awake hours without skin or limb pain issues. Baseline: SEE OBJECTIVE DATA Goal status: ongoing 08/17/2022   Berg Balance >/= 45/56 to indicate lower fall risk Baseline: SEE OBJECTIVE DATA Goal status: ongoing 08/17/2022   Patient ambulates >300' with prostheses and cane or less independently Baseline: SEE OBJECTIVE DATA Goal status: ongoing 08/17/2022   Patient negotiates ramps, curbs & stairs with single rail with prostheses and cane or less independently. Baseline: SEE OBJECTIVE DATA Goal status: ongoing 08/17/2022   6.   Patient  verbalizes & demonstrates understanding of how to properly use fitness equipment to return to gym. Baseline: SEE OBJECTIVE DATA Goal status: ongoing 08/17/2022   PLAN:   PT FREQUENCY: 2x/week   PT DURATION: 90 days / 13 weeks   PLANNED INTERVENTIONS: Therapeutic exercises, Therapeutic activity, Neuromuscular re-education, Balance training, Gait training, Patient/Family education, Self Care, Stair training, Prosthetic training, DME instructions, Electrical stimulation, Cryotherapy, Moist heat, and physical performance testing.   PLAN FOR NEXT SESSION:  ***  check STGs, check updated HEP for back, Prosthetic gait with forearm crutches including ramps and curbs, try lateral step up on 4" block at sink.    Jamey Reas, PT, DPT 08/26/2022, 4:21 PM

## 2022-09-03 ENCOUNTER — Encounter: Payer: Medicare HMO | Admitting: Physical Therapy

## 2022-09-03 ENCOUNTER — Other Ambulatory Visit: Payer: Self-pay

## 2022-09-03 DIAGNOSIS — N281 Cyst of kidney, acquired: Secondary | ICD-10-CM | POA: Diagnosis not present

## 2022-09-03 DIAGNOSIS — Z89211 Acquired absence of right upper limb below elbow: Secondary | ICD-10-CM

## 2022-09-03 DIAGNOSIS — R270 Ataxia, unspecified: Secondary | ICD-10-CM

## 2022-09-03 DIAGNOSIS — S88119A Complete traumatic amputation at level between knee and ankle, unspecified lower leg, initial encounter: Secondary | ICD-10-CM

## 2022-09-03 DIAGNOSIS — N5201 Erectile dysfunction due to arterial insufficiency: Secondary | ICD-10-CM | POA: Diagnosis not present

## 2022-09-03 MED ORDER — CRUTCH-MATE ADULT FOREARM MISC: 1.0000 | Freq: Once | 0 refills | Status: AC

## 2022-09-03 NOTE — Telephone Encounter (Signed)
Placed order for these crutches.

## 2022-09-04 ENCOUNTER — Encounter: Payer: Medicare HMO | Admitting: Physical Therapy

## 2022-09-04 ENCOUNTER — Ambulatory Visit (INDEPENDENT_AMBULATORY_CARE_PROVIDER_SITE_OTHER): Payer: Medicare HMO | Admitting: Orthopedic Surgery

## 2022-09-04 DIAGNOSIS — Z89511 Acquired absence of right leg below knee: Secondary | ICD-10-CM | POA: Diagnosis not present

## 2022-09-08 ENCOUNTER — Encounter: Payer: Self-pay | Admitting: Orthopedic Surgery

## 2022-09-08 ENCOUNTER — Encounter (HOSPITAL_BASED_OUTPATIENT_CLINIC_OR_DEPARTMENT_OTHER): Payer: Medicare HMO | Attending: General Surgery | Admitting: General Surgery

## 2022-09-08 ENCOUNTER — Encounter: Payer: Medicare HMO | Admitting: Physical Therapy

## 2022-09-08 DIAGNOSIS — L89154 Pressure ulcer of sacral region, stage 4: Secondary | ICD-10-CM | POA: Insufficient documentation

## 2022-09-08 DIAGNOSIS — F419 Anxiety disorder, unspecified: Secondary | ICD-10-CM | POA: Diagnosis not present

## 2022-09-08 DIAGNOSIS — Z89512 Acquired absence of left leg below knee: Secondary | ICD-10-CM | POA: Diagnosis not present

## 2022-09-08 DIAGNOSIS — I110 Hypertensive heart disease with heart failure: Secondary | ICD-10-CM | POA: Insufficient documentation

## 2022-09-08 DIAGNOSIS — G8929 Other chronic pain: Secondary | ICD-10-CM | POA: Diagnosis not present

## 2022-09-08 DIAGNOSIS — I509 Heart failure, unspecified: Secondary | ICD-10-CM | POA: Diagnosis not present

## 2022-09-08 DIAGNOSIS — Z89511 Acquired absence of right leg below knee: Secondary | ICD-10-CM | POA: Insufficient documentation

## 2022-09-08 DIAGNOSIS — M4628 Osteomyelitis of vertebra, sacral and sacrococcygeal region: Secondary | ICD-10-CM | POA: Insufficient documentation

## 2022-09-08 DIAGNOSIS — E119 Type 2 diabetes mellitus without complications: Secondary | ICD-10-CM | POA: Insufficient documentation

## 2022-09-08 NOTE — Progress Notes (Signed)
Office Visit Note   Patient: [name]           Date of Birth: 05/08/1966           MRN: [id number] Visit Date: 09/04/2022              Requested by: Loralee Pacas, [address] [place],  [place] [address] PCP: Loralee Pacas, MD  Chief Complaint  Patient presents with   Right Leg - Follow-up    BKA, needs new prosthetic Rx.      HPI: Patient is a 57 year old gentleman who presents with a broken down prosthetic liner on the right.  Patient states he is unable to proceed with therapy or activities of daily living due to the poor fitting socket at this time.  Assessment & Plan: Visit Diagnoses:  1. Right below-knee amputee [facility])     Plan: Prescription was provided for new socket liner materials and supplies on the right.  Follow-Up Instructions: Return if symptoms worsen or fail to improve.   Ortho Exam  Patient is alert, oriented, no adenopathy, well-dressed, normal affect, normal respiratory effort. Examination patient is subsiding into his socket.  He has redness and pressure points over the patella and medial and lateral joint line of the knee with pending skin breakdown due to the subsidence.  Patient is an existing right transtibial  amputee.  Patient's current comorbidities are not expected to impact the ability to function with the prescribed prosthesis. Patient verbally communicates a strong desire to use a prosthesis. Patient currently requires mobility aids to ambulate without a prosthesis.  Expects not to use mobility aids with a new prosthesis.  Patient is a K3 level ambulator that spends a lot of time walking around on uneven terrain over obstacles, up and down stairs, and ambulates with a variable cadence.     Imaging: No results found. No images are attached to the encounter.  Labs: Lab Results  Component Value Date   HGBA1C 11.9 (H) 06/15/2022   HGBA1C 8.6 (A) 12/05/2021   HGBA1C 8.6 12/05/2021   HGBA1C 8.6 (A) 12/05/2021    HGBA1C 8.6 (A) 12/05/2021   ESRSEDRATE 25 (H) 07/03/2022   ESRSEDRATE 11 12/30/2021   ESRSEDRATE 12 12/29/2021   CRP <1.0 07/03/2022   CRP 0.7 12/30/2021   CRP 1.1 (H) 12/29/2021   REPTSTATUS 04/11/2022 FINAL 04/09/2022   CULT >=100,000 COLONIES/mL PSEUDOMONAS AERUGINOSA (A) 04/09/2022   LABORGA PSEUDOMONAS AERUGINOSA (A) 04/09/2022     Lab Results  Component Value Date   ALBUMIN 4.0 07/03/2022   ALBUMIN 3.2 (L) 12/28/2021   ALBUMIN 2.4 (L) 12/16/2021    Lab Results  Component Value Date   MG 1.7 12/18/2021   MG 1.4 (L) 12/16/2021   MG 1.5 (L) 08/15/2021   No results found for: "VD25OH"  No results found for: "PREALBUMIN"    Latest Ref Rng & Units 07/03/2022    1:38 PM 06/15/2022    2:32 PM 12/31/2021    1:11 AM  CBC EXTENDED  WBC 4.0 - 10.5 K/uL 4.6  5.4  8.3   RBC 4.22 - 5.81 Mil/uL 5.80  6.05  4.82   Hemoglobin 13.0 - 17.0 g/dL 17.7  18.6  14.3   HCT 39.0 - 52.0 % 53.3  55.1  42.8   Platelets 150.0 - 400.0 K/uL 179.0  194  253      There is no height or weight on file to calculate BMI.  Orders:  No orders of the  defined types were placed in this encounter.  No orders of the defined types were placed in this encounter.    Procedures: No procedures performed  Clinical Data: No additional findings.  ROS:  All other systems negative, except as noted in the HPI. Review of Systems  Objective: Vital Signs: There were no vitals taken for this visit.  Specialty Comments:  No specialty comments available.  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) 01/75/1025   Malaise 06/22/2022   Polycythemia 06/22/2022   Amputation below knee ([place]) 04/23/2022   PTSD (post-traumatic stress disorder) 04/23/2022   Generalized anxiety disorder 01/21/2022   Episode of unresponsiveness 12/15/2021   Acute respiratory failure with hypoxia (HCC)  secondary to suspected aspiration pneumonia 12/15/2021   Renal mass 12/15/2021   Stage IV pressure ulcer of sacral region ([place]) 09/15/2021   Insomnia 85/27/7824   Chronic systolic CHF (congestive heart failure) ([place]) 09/14/2021   Hypogonadism in male 09/14/2021   Chronic pain 09/14/2021   Pseudohyponatremia 09/13/2021   Normocytic anemia 09/13/2021   Discitis of thoracic region    Paroxysmal atrial flutter (HCC) 07/22/2021   Chronic diastolic (congestive) heart failure ([place]) 05/16/2021   Lumbar spondylosis 03/25/2021   H/O amputation of leg through tibia and fibula ([place]) 11/10/2019   Degeneration of lumbar intervertebral disc 11/10/2019   Hypercholesterolemia 11/10/2019   Left below-knee amputee ([place]) 10/30/2019   Dyslipidemia 10/30/2019   Type 1 diabetes mellitus with diabetic polyneuropathy, with long-term current use of insulin ([place]) 10/12/2019   Right below-elbow amputee 10/12/2019   Hypertensive disorder 09/25/2019   Urinary hesitancy 09/25/2019   Prosthesis adjustments 09/25/2019   Erectile dysfunction 09/25/2019   Palliative care status 09/25/2019   Pulmonary nodules 09/25/2019   Depressive disorder 09/25/2019   Past Medical History:  Diagnosis Date   Acquired complex renal cyst 09/25/2019   AKI (acute kidney injury) ([place]) 07/22/2021   Cancer ([place])    renal   Chest pain 23/53/6144   Complication of anesthesia    Decubitus ulcer of sacral area 12/15/2021   Depression    Diabetes mellitus without complication ([place])    Diabetic infection of left foot ([place]) 04/17/2021   Episode of unresponsiveness 12/15/2021   Gas gangrene of foot ([place]) 07/22/2021   Healthcare maintenance 09/25/2019   Hematuria 09/25/2019   History of kidney stones    Lactic acidosis 12/15/2021   Leukocytosis 09/13/2021   Osteomyelitis (HCC)    Sacral pressure injury of skin 07/27/2021   Septic shock ([place]) 07/22/2021   Severe sepsis with acute organ dysfunction ([place]) 07/22/2021   Shortness of  breath 05/16/2021   Stage 3a chronic kidney disease ([place]) 31/54/0086   Systolic dysfunction    Type 2 diabetes mellitus 10/12/2019    Family History  Problem Relation Age of Onset   Anxiety disorder Mother    Cancer Father    Coronary artery disease Father     Past Surgical History:  Procedure Laterality Date   AMPUTATION Left 07/23/2021   Procedure: LEFT BELOW KNEE AMPUTATION;  Surgeon: [name], MD;  Location: [facility];  Service: Orthopedics;  Laterality: Left;   BELOW KNEE LEG AMPUTATION Right    BUBBLE STUDY  07/29/2021   Procedure: BUBBLE STUDY;  Surgeon: Sueanne Margarita, MD;  Location: [facility];  Service: Cardiovascular;;   CARDIOVERSION N/A 07/29/2021   Procedure: CARDIOVERSION;  Surgeon: Sueanne Margarita, MD;  Location: [facility];  Service: Cardiovascular;  Laterality: N/A;   RADIOLOGY WITH ANESTHESIA N/A 08/05/2021   Procedure: MRI LUMBAR WITH AND WITHOUT; THORASIC SPINE WITH AND WITHOUT WITH ANESTHESIA;  Surgeon: Radiologist, Medication, MD;  Location: [facility];  Service: Radiology;  Laterality: N/A;   RADIOLOGY WITH ANESTHESIA N/A 08/07/2021   Procedure: MRI WITH LUMBER WITH AND WITHOUT CONTRAST,THORACIC WITH AND WITHOUT CONTRAST;  Surgeon: Radiologist, Medication, MD;  Location: [facility];  Service: Radiology;  Laterality: N/A;   RADIOLOGY WITH ANESTHESIA N/A 09/13/2021   Procedure: MRI WITH ANESTHESIA;  Surgeon: Luanne Bras, MD;  Location: [facility];  Service: Radiology;  Laterality: N/A;   TEE WITHOUT CARDIOVERSION N/A 07/29/2021   Procedure: TRANSESOPHAGEAL ECHOCARDIOGRAM (TEE);  Surgeon: Sueanne Margarita, MD;  [facility];  Service: Cardiovascular;  Laterality: N/A;   Social History   Occupational History   Not on file  Tobacco Use   Smoking status: Former    Types: Cigarettes   Smokeless tobacco: Current    Types: Chew  Vaping Use   Vaping Use: Never used  Substance and Sexual Activity   Alcohol use: Not Currently   Drug use: Never   Sexual  activity: Not Currently

## 2022-09-08 NOTE — Progress Notes (Signed)
VISENTE, KIRKER ([id number]) [id number] Page 1 of 7 Visit Report for 09/08/2022 Arrival Information Details Patient Name: Date of Service: [place], [name] 09/08/2022 1:15 PM Medical Record Number: [id number] Patient Account Number: [id number] Date of Birth/Sex: Treating RN: 1965-10-20 (57 y.o. Waldron Session Primary Care Aaleigha Bozza: Berniece Pap Other Clinician: Referring Ercelle Winkles: Treating Lamine Laton/Extender: Lannie Fields in Treatment: 1 Visit Information History Since Last Visit Added or deleted any medications: No Patient Arrived: Wheel Chair Any new allergies or adverse reactions: No Arrival Time: 13:18 Had a fall or experienced change in No Accompanied By: friend activities of daily living that may affect Transfer Assistance: Manual risk of falls: Patient Requires Transmission-Based Precautions: No Signs or symptoms of abuse/neglect since last visito No Patient Has Alerts: No Hospitalized since last visit: No Implantable device outside of the clinic excluding No cellular tissue based products placed in the center since last visit: Has Dressing in Place as Prescribed: Yes Pain Present Now: Yes Electronic Signature(s) Signed: 09/08/2022 4:19:49 PM By: Blanche East RN Entered By: Blanche East on 09/08/2022 13:19:37 -------------------------------------------------------------------------------- Encounter Discharge Information Details Patient Name: Date of Service: [name], [name] 09/08/2022 1:15 PM Medical Record Number: [id number] Patient Account Number: [id number] Date of Birth/Sex: Treating RN: [birth year] (57 y.o. Waldron Session Primary Care Nakia Koble: Berniece Pap Other Clinician: Referring Osa Campoli: Treating Kristie Bracewell/Extender: Lannie Fields in Treatment: 34 Encounter Discharge Information Items Post Procedure Vitals Discharge Condition: Stable Temperature (F): 97.6 Ambulatory Status:  Wheelchair Pulse (bpm): 79 Discharge Destination: Home Respiratory Rate (breaths/min): 16 Transportation: Private Auto Blood Pressure (mmHg): 151/87 Accompanied By: friend Schedule Follow-up Appointment: Yes Clinical Summary of Care: Electronic Signature(s) Signed: 09/08/2022 4:19:49 PM By: Blanche East RN Entered By: Blanche East on 09/08/2022 13:47:20 [name] ([id number]) [id number] Page 2 of 7 -------------------------------------------------------------------------------- Lower Extremity Assessment Details Patient Name: Date of Service: [name] 09/08/2022 1:15 PM Medical Record Number: [id number] Patient Account Number: [id number] Date of Birth/Sex: Treating RN: 01/27/1966 (57 y.o. Waldron Session Primary Care Alisa Stjames: Berniece Pap Other Clinician: Referring Savreen Gebhardt: Treating Randall Rampersad/Extender: Lannie Fields in Treatment: 34 Electronic Signature(s) Signed: 09/08/2022 4:19:49 PM By: Blanche East RN Entered By: Blanche East on 09/08/2022 13:21:10 -------------------------------------------------------------------------------- Multi Wound Chart Details Patient Name: Date of Service: [name], [name] 09/08/2022 1:15 PM Medical Record Number: [id number] Patient Account Number: [id number] Date of Birth/Sex: Treating RN: September 28, 1965 (57 y.o. M) Primary Care Alejandrina Raimer: Berniece Pap Other Clinician: Referring Jinna Weinman: Treating Rishard Delange/Extender: Lannie Fields in Treatment: 34 Vital Signs Height(in): Pulse(bpm): 79 Weight(lbs): Blood Pressure(mmHg): 151/87 Body Mass Index(BMI): Temperature(F): 97.6 Respiratory Rate(breaths/min): 16 [1:Photos:] [N/A:N/A] Sacrum N/A N/A Wound Location: Pressure Injury N/A N/A Wounding Event: Pressure Ulcer N/A N/A Primary Etiology: Congestive Heart Failure, N/A N/A Comorbid History: Hypertension, Type II Diabetes, Osteomyelitis, Confinement  Anxiety 07/29/2021 N/A N/A Date Acquired: 32 N/A N/A Weeks of Treatment: Open N/A N/A Wound Status: No N/A N/A Wound Recurrence: 0.5x0.6x0.7 N/A N/A Measurements L x W x D (cm) 0.236 N/A N/A A (cm) : rea 0.165 N/A N/A Volume (cm) : 96.00% N/A N/A % Reduction in A rea: 97.50% N/A N/A % Reduction in Volume: 12 Starting Position 1 (o'clock): 12 Ending Position 1 (o'clock): 0.8 Maximum Distance 1 (cm): Yes N/A N/A Undermining: Category/Stage IV N/A N/A Classification: Medium N/A N/A Exudate A mount: Serosanguineous N/A N/A Exudate TypeCLINTON, DRAGONE ([id number]) [id number] Page 3 of 7 red, brown N/A N/A Exudate Color: Epibole N/A N/A Wound Margin: Large (  67-100%) N/A N/A Granulation Amount: Pink, Pale N/A N/A Granulation Quality: Small (1-33%) N/A N/A Necrotic Amount: Fat Layer (Subcutaneous Tissue): Yes N/A N/A Exposed Structures: Fascia: No Tendon: No Muscle: No Joint: No Bone: No Small (1-33%) N/A N/A Epithelialization: Debridement - Excisional N/A N/A Debridement: Pre-procedure Verification/Time Out 13:32 N/A N/A Taken: Lidocaine 4% Topical Solution N/A N/A Pain Control: Subcutaneous, Slough N/A N/A Tissue Debrided: Skin/Subcutaneous Tissue N/A N/A Level: 0.3 N/A N/A Debridement A (sq cm): rea Curette N/A N/A Instrument: Minimum N/A N/A Bleeding: Pressure N/A N/A Hemostasis A chieved: Procedure was tolerated well N/A N/A Debridement Treatment Response: 0.5x0.6x0.7 N/A N/A Post Debridement Measurements L x W x D (cm) 0.165 N/A N/A Post Debridement Volume: (cm) Category/Stage IV N/A N/A Post Debridement Stage: Excoriation: Yes N/A N/A Periwound Skin Texture: Callus: Yes Rash: Yes Maceration: Yes N/A N/A Periwound Skin Moisture: Dry/Scaly: No No Abnormalities Noted N/A N/A Periwound Skin Color: No Abnormality N/A N/A Temperature: Debridement N/A N/A Procedures Performed: Treatment  Notes Electronic Signature(s) Signed: 09/08/2022 1:36:24 PM By: [name] MD FACS Entered By: [name] on 09/08/2022 13:36:23 -------------------------------------------------------------------------------- Multi-Disciplinary Care Plan Details Patient Name: Date of Service: [name], [name] 09/08/2022 1:15 PM Medical Record Number: [id number] Patient Account Number: [id number] Date of Birth/Sex: Treating RN: [birth year] (57 y.o. Waldron Session Primary Care Reyce Lubeck: Berniece Pap Other Clinician: Referring Angelicia Lessner: Treating Ragan Duhon/Extender: Lannie Fields in Treatment: Carter reviewed with physician Active Inactive Nutrition Nursing Diagnoses: Impaired glucose control: actual or potential Potential for alteratiion in Nutrition/Potential for imbalanced nutrition Goals: Patient/caregiver will maintain therapeutic glucose control Date Initiated: 01/08/2022 Target Resolution Date: 09/04/2022 Goal Status: Active Interventions: Assess HgA1c results as ordered upon admission and as needed Assess patient nutrition upon admission and as needed per policy Luyando, Herbie [place] ([id number]) [id number] Page 4 of 7 Provide education on elevated blood sugars and impact on wound healing Treatment Activities: Dietary management education, guidance and counseling : 01/08/2022 Patient referred to Primary Care Physician for further nutritional evaluation : 01/08/2022 Notes: Pressure Nursing Diagnoses: Knowledge deficit related to causes and risk factors for pressure ulcer development Knowledge deficit related to management of pressures ulcers Potential for impaired tissue integrity related to pressure, friction, moisture, and shear Goals: Patient/caregiver will verbalize understanding of pressure ulcer management Date Initiated: 01/08/2022 Target Resolution Date: 09/04/2022 Goal Status: Active Interventions: Assess:  immobility, friction, shearing, incontinence upon admission and as needed Assess offloading mechanisms upon admission and as needed Assess potential for pressure ulcer upon admission and as needed Notes: Wound/Skin Impairment Nursing Diagnoses: Impaired tissue integrity Knowledge deficit related to ulceration/compromised skin integrity Goals: Patient/caregiver will verbalize understanding of skin care regimen Date Initiated: 01/08/2022 Target Resolution Date: 09/04/2022 Goal Status: Active Ulcer/skin breakdown will have a volume reduction of 30% by week 4 Date Initiated: 01/08/2022 Date Inactivated: 02/18/2022 Target Resolution Date: 02/05/2022 Goal Status: Unmet Unmet Reason: VAC leaking Ulcer/skin breakdown will have a volume reduction of 50% by week 8 Date Initiated: 02/18/2022 Date Inactivated: 03/04/2022 Target Resolution Date: 03/05/2022 Goal Status: Unmet Unmet Reason: infection Ulcer/skin breakdown will have a volume reduction of 80% by week 12 Date Initiated: 03/04/2022 Date Inactivated: 04/01/2022 Target Resolution Date: 04/02/2022 Goal Status: Unmet Unmet Reason: too much moisture Interventions: Assess patient/caregiver ability to obtain necessary supplies Assess patient/caregiver ability to perform ulcer/skin care regimen upon admission and as needed Assess ulceration(s) every visit Provide education on ulcer and skin care Treatment Activities: Skin care regimen initiated : 01/08/2022 Topical wound management initiated : 01/08/2022  Notes: Electronic Signature(s) Signed: 09/08/2022 4:19:49 PM By: Blanche East RN Entered By: Blanche East on 09/08/2022 13:35:24 -------------------------------------------------------------------------------- Pain Assessment Details Patient Name: Date of Service: [name], [name] 09/08/2022 1:15 PM Medical Record Number: [id number] Patient Account Number: [id number] Date of Birth/Sex: Treating RN: May 17, 1966 (57 y.o. Waldron Session [place],  [place] ([id number]) [id number] Page 5 of 7 Primary Care Geo Slone: Berniece Pap Other Clinician: Referring Ruthellen Tippy: Treating Naoko Diperna/Extender: Lannie Fields in Treatment: 34 Active Problems Location of Pain Severity and Description of Pain Patient Has Paino Yes Site Locations Pain Location: Generalized Pain Rate the pain. Current Pain Level: 8 Character of Pain Describe the Pain: Aching, Tender Pain Management and Medication Current Pain Management: Electronic Signature(s) Signed: 09/08/2022 4:19:49 PM By: Blanche East RN Entered By: Blanche East on 09/08/2022 13:21:05 -------------------------------------------------------------------------------- Patient/Caregiver Education Details Patient Name: Date of Service: [name], [name] 1/2/2024andnbsp1:15 PM Medical Record Number: [id number] Patient Account Number: [id number] Date of Birth/Gender: Treating RN: 12-15-1965 (57 y.o. Waldron Session Primary Care Physician: Berniece Pap Other Clinician: Referring Physician: Treating Physician/Extender: Lannie Fields in Treatment: 49 Education Assessment Education Provided To: Patient Education Topics Provided Wound/Skin Impairment: Methods: Explain/Verbal Responses: Reinforcements needed, State content correctly Electronic Signature(s) Signed: 09/08/2022 4:19:49 PM By: Blanche East RN Entered By: Blanche East on 09/08/2022 13:35:34 [name] ([id number]) [id number] Page 6 of 7 -------------------------------------------------------------------------------- Wound Assessment Details Patient Name: Date of Service: [name] 09/08/2022 1:15 PM Medical Record Number: [id number] Patient Account Number: [id number] Date of Birth/Sex: Treating RN: October 05, 1965 (57 y.o. Waldron Session Primary Care Itati Brocksmith: Berniece Pap Other Clinician: Referring Tyjon Bowen: Treating  Jeevan Kalla/Extender: Lannie Fields in Treatment: 34 Wound Status Wound Number: 1 Primary Pressure Ulcer Etiology: Wound Location: Sacrum Wound Open Wounding Event: Pressure Injury Status: Date Acquired: 07/29/2021 Comorbid Congestive Heart Failure, Hypertension, Type II Diabetes, Weeks Of Treatment: 34 History: Osteomyelitis, Confinement Anxiety Clustered Wound: No Photos Wound Measurements Length: (cm) 0.5 Width: (cm) 0.6 Depth: (cm) 0.7 Area: (cm) 0.236 Volume: (cm) 0.165 % Reduction in Area: 96% % Reduction in Volume: 97.5% Epithelialization: Small (1-33%) Tunneling: No Undermining: Yes Starting Position (o'clock): 12 Ending Position (o'clock): 12 Maximum Distance: (cm) 0.8 Wound Description Classification: Category/Stage IV Wound Margin: Epibole Exudate Amount: Medium Exudate Type: Serosanguineous Exudate Color: red, brown Foul Odor After Cleansing: No Slough/Fibrino Yes Wound Bed Granulation Amount: Large (67-100%) Exposed Structure Granulation Quality: Pink, Pale Fascia Exposed: No Necrotic Amount: Small (1-33%) Fat Layer (Subcutaneous Tissue) Exposed: Yes Necrotic Quality: Adherent Slough Tendon Exposed: No Muscle Exposed: No Joint Exposed: No Bone Exposed: No Periwound Skin Texture Texture Color No Abnormalities Noted: No No Abnormalities Noted: Yes Callus: Yes Temperature / Pain Excoriation: Yes Temperature: No Abnormality Rash: Yes Moisture No Abnormalities Noted: No Dry / Scaly: No MacerationCUAUHTEMOC, HUEGEL ([id number]) [id number] Page 7 of 7 Treatment Notes Wound #1 (Sacrum) Cleanser Soap and Water Discharge Instruction: May shower and wash wound with dial antibacterial soap and water prior to dressing change. Wound Cleanser Discharge Instruction: Cleanse the wound with wound cleanser prior to applying a clean dressing using gauze sponges, not tissue or cotton balls. Peri-Wound  Care Skin Prep Discharge Instruction: Use skin prep as directed Zinc Oxide Ointment 30g tube Discharge Instruction: Apply Zinc Oxide to macerated periwound with each dressing change as needed Topical Primary Dressing Sorbalgon AG Dressing 2x2 (in/in) Discharge Instruction: Apply to wound bed as instructed Secondary Dressing Zetuvit Plus Silicone Border Sacrum Dressing, Sm, 7x7 (in/in) Discharge Instruction:  Apply silicone border over primary dressing as directed. Secured With 35M Medipore H Soft Cloth Surgical T ape, 4 x 10 (in/yd) Discharge Instruction: Secure with tape as directed. Compression Wrap Compression Stockings Add-Ons Gloves, Large Electronic Signature(s) Signed: 09/08/2022 4:19:49 PM By: Blanche East RN Entered By: Blanche East on 09/08/2022 13:28:15 -------------------------------------------------------------------------------- Vitals Details Patient Name: Date of Service: [name], [name] 09/08/2022 1:15 PM Medical Record Number: [id number] Patient Account Number: [id number] Date of Birth/Sex: Treating RN: 14-Aug-1966 (57 y.o. Waldron Session Primary Care Reisha Wos: Berniece Pap Other Clinician: Referring Shaquira Moroz: Treating Oakleigh Hesketh/Extender: Lannie Fields in Treatment: 34 Vital Signs Time Taken: 13:20 Temperature (F): 97.6 Pulse (bpm): 79 Respiratory Rate (breaths/min): 16 Blood Pressure (mmHg): 151/87 Reference Range: 80 - 120 mg / dl Electronic Signature(s) Signed: 09/08/2022 4:19:49 PM By: Blanche East RN Entered By: Blanche East on 09/08/2022 13:20:37

## 2022-09-08 NOTE — Progress Notes (Signed)
CATO, LIBURD ([id number]) [id number] Page 1 of 10 Visit Report for 09/08/2022 Chief Complaint Document Details Patient Name: Date of Service: [place], [name] 09/08/2022 1:15 PM Medical Record Number: [id number] Patient Account Number: [id number] Date of Birth/Sex: Treating RN: 05-Nov-1965 (57 y.o. M) Primary Care Provider: Berniece Pap Other Clinician: Referring Provider: Treating Provider/Extender: Lannie Fields in Treatment: 34 Information Obtained from: Patient Chief Complaint Patient is at the clinic for treatment of an open pressure ulcer Electronic Signature(s) Signed: 09/08/2022 1:36:31 PM By: [name] MD FACS Entered By: [name] on 09/08/2022 13:36:31 -------------------------------------------------------------------------------- Debridement Details Patient Name: Date of Service: [name], [name] 09/08/2022 1:15 PM Medical Record Number: [id number] Patient Account Number: [id number] Date of Birth/Sex: Treating RN: Mar 08, 1966 (58 y.o. Waldron Session Primary Care Provider: Berniece Pap Other Clinician: Referring Provider: Treating Provider/Extender: Lannie Fields in Treatment: 34 Debridement Performed for Assessment: Wound #1 Sacrum Performed By: Physician [name], MD Debridement Type: Debridement Level of Consciousness (Pre-procedure): Awake and Alert Pre-procedure Verification/Time Out Yes - 13:32 Taken: Start Time: 13:33 Pain Control: Lidocaine 4% T opical Solution T Area Debrided (L x W): otal 0.5 (cm) x 0.6 (cm) = 0.3 (cm) Tissue and other material debrided: Viable, Non-Viable, Slough, Subcutaneous, Skin: Epidermis, Slough Level: Skin/Subcutaneous Tissue Debridement Description: Excisional Instrument: Curette Bleeding: Minimum Hemostasis Achieved: Pressure Response to Treatment: Procedure was tolerated well Level of Consciousness (Post- Awake and  Alert procedure): Post Debridement Measurements of Total Wound Length: (cm) 0.5 Stage: Category/Stage IV Width: (cm) 0.6 Depth: (cm) 0.7 Volume: (cm) 0.165 Character of Wound/Ulcer Post Debridement: Improved Post Procedure Diagnosis Same as [name] ([id number]) [id number] Page 2 of 10 Notes Scribed for Dr. Celine Ahr by Blanche East, RN Electronic Signature(s) Signed: 09/08/2022 1:45:20 PM By: [name] MD FACS Signed: 09/08/2022 4:19:49 PM By: Blanche East RN Entered By: Blanche East on 09/08/2022 13:35:01 -------------------------------------------------------------------------------- HPI Details Patient Name: Date of Service: [name], [name] 09/08/2022 1:15 PM Medical Record Number: [id number] Patient Account Number: [id number] Date of Birth/Sex: Treating RN: April 21, 1966 (57 y.o. M) Primary Care Provider: Berniece Pap Other Clinician: Referring Provider: Treating Provider/Extender: Lannie Fields in Treatment: 34 History of Present Illness HPI Description: ADMISSION 01/08/2022 This is a 57 year old male with a past medical history notable for type 2 diabetes mellitus (last A1c was 8.6) congestive heart failure, hypertension, chronic pain, and bilateral below-knee amputations. His most recent amputation was in November 2022. While he was in the hospital, he developed a sacral pressure ulcer. He was subsequently in a skilled nursing facility for some time. He was discharged with home health and had been in a wound VAC, but was then admitted to the hospital last week when the wound appeared to be worsening. Apparently the periwound skin was macerated and the device that he had been using was leaking quite a bit. Evaluation while in the hospital included a consultation with infectious disease who did not think he had any evidence of osteomyelitis, plastic surgery who felt that he was not an appropriate flap  candidate (their note also states that he is not interested in a flap) and wound care who took him out of the wound VAC and initiated wet-to-dry dressing changes. He has a new wound VAC from [facility] on order, anticipated delivery today. The wound itself is fairly small and isolated to the sacrum. There is muscle exposed. No bone is appreciated but it is palpable beneath the surface. The muscle itself is bit pale  and there is heaped up epibole around the wound edges. No significant odor or drainage. 01/14/2022: His wound VAC was not initiated until this past Friday. He has not had any issues with the VAC but today we found that the bridge foam was applied directly to the skin rather than over a layer of adhesive drape. His home health nurse also requested that we consider applying silver collagen to the wound bed in addition to the VAC. There is still a little bit of heaped up senescent skin around the wound periphery. No significant change to the wound dimensions. 01/21/2022: No significant change to the wound dimensions. The senescent skin has not reaccumulated. The periwound skin remains a bit macerated but without any obvious breakdown. The wound surface itself has a shiny appearance with a little bit of slough accumulation; no true robust granulation tissue at this time. 01/28/2022: The wound is about the same size but a little bit shallower. There is a little bit of senescent skin reaccumulation at the cranial aspect. The periwound skin is red but not macerated and without any tissue breakdown. The wound still does not have the most robust surface. There is a bit of slough accumulation. 02/04/2022: The wound is a bit smaller and the undermining has come in somewhat. There continues to be granulation tissue formation within the wound bed. No significant slough accumulation and his periwound skin is in better condition. 02/18/2022: The wound stinks today. There is no obvious pus but the drainage and wound  itself are malodorous. He continues to have heaped up tissue within the wound bed that is rather grayish and not particularly robust-appearing. He is very angry today with his situation. 02/25/2022: Last week, in response to the odor coming from the wound, I took a culture and prescribed topical gentamicin as well as oral cefdinir. Apparently [facility] contacted him about a topical compounded antibiotic, but he did not realize this and he hung up on them. Today, the odor is no longer present. There is some senescent skin around the wound margins as well as continued heaped up granulation tissue in the center of the wound. The undermining continues to contract. 03/04/2022: The wound continues to contract and look better. He still has heaped up hypertrophic granulation tissue near the orifice. No odor was coming from the wound today. He is awaiting [facility] delivery. 03/18/2022: 2-week follow-up. [facility] topical antibiotic has been initiated. The chemical cauterization of the hypertrophic granulation tissue was quite successful and the surface is much flatter today. He has heaped up senescent skin around the perimeter. His home health providers have figured out a way to keep the wound VAC from losing suction by bolstering with DuoDERM. Overall there has been significant improvement since our last visit. 04/01/2022: 2-week follow-up. The wound continues to contract. Once again, there is heaped up senescent skin around the perimeter. He has a little bit of skin breakdown in the distribution of the adhesive drape from the wound VAC. No odor or purulent drainage. No concern for infection. 04/15/2022: 2-week follow-up. He has developed a fairly substantial rash from the drape adhesive for his wound VAC. The periwound has a lot of heaped up epiboly at the margins. The tissue in the wound bed is a little bit pale but there is no odor or purulent drainage. 04/22/2022: Last week we discontinued the VAC. Today, the skin  around his wound is in significantly improved condition. His rash is resolving. He does have some heaped up senescent skin around the wound margin  and a layer of slough on the wound surface, but there is no concern for active infection. 04/29/2022: The wound continues to contract. The surface is clean. He continues to build up senescent skin around the wound edges that subsequently get a little bit macerated. No concern for infection. 05/06/2022: The wound is smaller again today in all dimensions. The surface is clean. He has his usual accumulation of macerated senescent skin around the wound edges. 05/13/2022: Continued wound contracture. The undermining has decreased quite a bit. Clean surface. Senescent skin heaped up around the wound edges, as per [place], Herbie [place] ([id number]) [phone].pdf Page 3 of 10 usual. 05/22/2022: The macerated senescent skin has accumulated once again. The wound dimensions are slightly smaller and the surface is clean. He has been having difficulty keeping the packing in the wound due to the large foam border dressing that he prefers. 06/03/2022: The wound is smaller and shallower with less undermining. He has built up macerated senescent skin around the margins, as per usual. He and his friend have figured out a way to make sure that his packing stays in the wound with his [place]. 06/12/2022: The wound is a little bit shallower again today. He still has accumulated senescent periwound skin, but otherwise the wound is clean. He will be undergoing bilateral recurrent inguinal hernia repair and umbilical hernia repair next week. 10/20 sacral pressure ulcer. using [facility] and backing wet to dry. Has developed a rash sur rounding the wound 07/03/2022: Continued contracture of the wound. It is very clean and there is no odor or purulent drainage. Continued buildup of senescent skin around the margin. 07/13/2022: There is less undermining present today.  The orifice continues to contract. He continues to accumulate senescent skin around the perimeter. The rash around the wound has improved. 07/27/2022: The wound is smaller again today and the undermining is closed then. There is senescent skin accumulation, as usual. 08/07/2022: The wound continues to contract but has its usual accumulation of periwound senescent skin. Unfortunately, the periwound skin looks like it has gotten more moist. It is red and angry and the patient says it is more painful. 08/14/2022: The wound is smaller again this week. His periwound skin is in much better condition. There is still senescent skin around the wound margins and a bit of slough on the surface. 12/18; this is a lower sacral pressure ulcer. We have been using silver alginate for the last 2 weeks. This is longstanding and refractory. At 1 point I think had exposed bone he no longer has this now. He tells me that he has had problems with his right lower leg stump and he is going to see hangers she is therefore not walking but using his wheelchair although he says he is in his wheelchair "only 10% of the time]. Otherwise he claims to be rigorous with offloading this. 09/08/2022: The wound is considerably smaller than the last time I saw it. He has, as usual, the senescent skin accumulation around the wound margin. The undermining and depth, as well as the overall dimensions have come in significantly. Electronic Signature(s) Signed: 09/08/2022 1:37:34 PM By: [name] MD FACS Entered By: [name] on 09/08/2022 13:37:34 -------------------------------------------------------------------------------- Physical Exam Details Patient Name: Date of Service: [name], [name] 09/08/2022 1:15 PM Medical Record Number: [id number] Patient Account Number: [id number] Date of Birth/Sex: Treating RN: 1966/06/19 (57 y.o. M) Primary Care Provider: Berniece Pap Other Clinician: Referring Provider: Treating  Provider/Extender: Lannie Fields in Treatment: 53 Constitutional He  is hypertensive, but asymptomatic.. . . . No acute distress. Respiratory Normal work of breathing on room air. Notes 09/08/2022: The wound is considerably smaller than the last time I saw it. He has, as usual, the senescent skin accumulation around the wound margin. The undermining and depth, as well as the overall dimensions have come in significantly. Electronic Signature(s) Signed: 09/08/2022 1:38:55 PM By: [name] MD FACS Entered By: [name] on 09/08/2022 13:38:55 [name] ([id number]) [id number] Page 4 of 10 -------------------------------------------------------------------------------- Physician Orders Details Patient Name: Date of Service: [name] 09/08/2022 1:15 PM Medical Record Number: [id number] Patient Account Number: [id number] Date of Birth/Sex: Treating RN: 10/31/1965 (57 y.o. Waldron Session Primary Care Provider: Berniece Pap Other Clinician: Referring Provider: Treating Provider/Extender: Lannie Fields in Treatment: [phone] Verbal / Phone Orders: No Diagnosis Coding ICD-10 Coding Code Description L89.154 Pressure ulcer of sacral region, stage 4 Z89.512 Acquired absence of left leg below knee Z89.511 Acquired absence of right leg below knee Follow-up Appointments ppointment in 2 weeks. - Dr. Celine Ahr - room 1 Return A Anesthetic Wound #1 Sacrum (In clinic) Topical Lidocaine 4% applied to wound bed Off-Loading Gel mattress overlay (Group 1) Turn and reposition every 2 hours Wound Treatment Wound #1 - Sacrum Cleanser: Soap and Water 3 x Per Week/30 Days Discharge Instructions: May shower and wash wound with dial antibacterial soap and water prior to dressing change. Cleanser: Wound Cleanser 3 x Per Week/30 Days Discharge Instructions: Cleanse the wound with wound cleanser prior to applying  a clean dressing using gauze sponges, not tissue or cotton balls. Peri-Wound Care: Skin Prep 3 x Per Week/30 Days Discharge Instructions: Use skin prep as directed Peri-Wound Care: Zinc Oxide Ointment 30g tube 3 x Per Week/30 Days Discharge Instructions: Apply Zinc Oxide to macerated periwound with each dressing change as needed Prim Dressing: Sorbalgon AG Dressing 2x2 (in/in) 3 x Per Week/30 Days ary Discharge Instructions: Apply to wound bed as instructed Secondary Dressing: Zetuvit Plus Silicone Border Sacrum Dressing, Sm, 7x7 (in/in) 3 x Per Week/30 Days Discharge Instructions: Apply silicone border over primary dressing as directed. Secured With: 31M Medipore H Soft Cloth Surgical T ape, 4 x 10 (in/yd) 3 x Per Week/30 Days Discharge Instructions: Secure with tape as directed. Add-Ons: Gloves, Large 3 x Per Week/30 Days Electronic Signature(s) Signed: 09/08/2022 1:45:20 PM By: [name] MD FACS Entered By: [name] on 09/08/2022 13:39:24 -------------------------------------------------------------------------------- Problem List Details Patient Name: Date of Service: [name], [name] 09/08/2022 1:15 PM Medical Record Number: [id number] Patient Account Number: [id number] Date of Birth/Sex: Treating RN: [birth year] (57 y.o. M) Primary Care Provider: Berniece Pap Other Clinician: Kathrine [name] ([id number]) [id number] Page 5 of 10 Referring Provider: Treating Provider/Extender: Lannie Fields in Treatment: 34 Active Problems ICD-10 Encounter Code Description Active Date MDM Diagnosis L89.154 Pressure ulcer of sacral region, stage 4 01/08/2022 No Yes Z89.512 Acquired absence of left leg below knee 01/08/2022 No Yes Z89.511 Acquired absence of right leg below knee 01/08/2022 No Yes Inactive Problems Resolved Problems Electronic Signature(s) Signed: 09/08/2022 1:36:13 PM By: [name] MD FACS Entered By: [name] on 09/08/2022 13:36:13 -------------------------------------------------------------------------------- Progress Note Details Patient Name: Date of Service: [name], [name] 09/08/2022 1:15 PM Medical Record Number: [id number] Patient Account Number: [id number] Date of Birth/Sex: Treating RN: 01/10/1966 (57 y.o. M) Primary Care Provider: Berniece Pap Other Clinician: Referring Provider: Treating Provider/Extender: Lannie Fields in Treatment: 34 Subjective Chief Complaint Information obtained from  Patient Patient is at the clinic for treatment of an open pressure ulcer History of Present Illness (HPI) ADMISSION 01/08/2022 This is a 58 year old male with a past medical history notable for type 2 diabetes mellitus (last A1c was 8.6) congestive heart failure, hypertension, chronic pain, and bilateral below-knee amputations. His most recent amputation was in November 2022. While he was in the hospital, he developed a sacral pressure ulcer. He was subsequently in a skilled nursing facility for some time. He was discharged with home health and had been in a wound VAC, but was then admitted to the hospital last week when the wound appeared to be worsening. Apparently the periwound skin was macerated and the device that he had been using was leaking quite a bit. Evaluation while in the hospital included a consultation with infectious disease who did not think he had any evidence of osteomyelitis, plastic surgery who felt that he was not an appropriate flap candidate (their note also states that he is not interested in a flap) and wound care who took him out of the wound VAC and initiated wet-to-dry dressing changes. He has a new wound VAC from [facility] on order, anticipated delivery today. The wound itself is fairly small and isolated to the sacrum. There is muscle exposed. No bone is appreciated but it is palpable beneath the surface. The muscle itself is bit pale and  there is heaped up epibole around the wound edges. No significant odor or drainage. 01/14/2022: His wound VAC was not initiated until this past Friday. He has not had any issues with the VAC but today we found that the bridge foam was applied directly to the skin rather than over a layer of adhesive drape. His home health nurse also requested that we consider applying silver collagen to the wound bed in addition to the VAC. There is still a little bit of heaped up senescent skin around the wound periphery. No significant change to the wound dimensions. 01/21/2022: No significant change to the wound dimensions. The senescent skin has not reaccumulated. The periwound skin remains a bit macerated but without any obvious breakdown. The wound surface itself has a shiny appearance with a little bit of slough accumulation; no true robust granulation tissue at this time. 01/28/2022: The wound is about the same size but a little bit shallower. There is a little bit of senescent skin reaccumulation at the cranial aspect. The periwound skin is red but not macerated and without any tissue breakdown. The wound still does not have the most robust surface. There is a bit of slough accumulation. 02/04/2022: The wound is a bit smaller and the undermining has come in somewhat. There continues to be granulation tissue formation within the wound bed. No [name], [name] ([id number]) [id number] Page 6 of 10 significant slough accumulation and his periwound skin is in better condition. 02/18/2022: The wound stinks today. There is no obvious pus but the drainage and wound itself are malodorous. He continues to have heaped up tissue within the wound bed that is rather grayish and not particularly robust-appearing. He is very angry today with his situation. 02/25/2022: Last week, in response to the odor coming from the wound, I took a culture and prescribed topical gentamicin as well as oral cefdinir.  Apparently [facility] contacted him about a topical compounded antibiotic, but he did not realize this and he hung up on them. Today, the odor is no longer present. There is some senescent skin around the wound margins as well as continued heaped up  granulation tissue in the center of the wound. The undermining continues to contract. 03/04/2022: The wound continues to contract and look better. He still has heaped up hypertrophic granulation tissue near the orifice. No odor was coming from the wound today. He is awaiting [place] delivery. 03/18/2022: 2-week follow-up. [facility] topical antibiotic has been initiated. The chemical cauterization of the hypertrophic granulation tissue was quite successful and the surface is much flatter today. He has heaped up senescent skin around the perimeter. His home health providers have figured out a way to keep the wound VAC from losing suction by bolstering with DuoDERM. Overall there has been significant improvement since our last visit. 04/01/2022: 2-week follow-up. The wound continues to contract. Once again, there is heaped up senescent skin around the perimeter. He has a little bit of skin breakdown in the distribution of the adhesive drape from the wound VAC. No odor or purulent drainage. No concern for infection. 04/15/2022: 2-week follow-up. He has developed a fairly substantial rash from the drape adhesive for his wound VAC. The periwound has a lot of heaped up epiboly at the margins. The tissue in the wound bed is a little bit pale but there is no odor or purulent drainage. 04/22/2022: Last week we discontinued the VAC. Today, the skin around his wound is in significantly improved condition. His rash is resolving. He does have some heaped up senescent skin around the wound margin and a layer of slough on the wound surface, but there is no concern for active infection. 04/29/2022: The wound continues to contract. The surface is clean. He continues to build up  senescent skin around the wound edges that subsequently get a little bit macerated. No concern for infection. 05/06/2022: The wound is smaller again today in all dimensions. The surface is clean. He has his usual accumulation of macerated senescent skin around the wound edges. 05/13/2022: Continued wound contracture. The undermining has decreased quite a bit. Clean surface. Senescent skin heaped up around the wound edges, as per usual. 05/22/2022: The macerated senescent skin has accumulated once again. The wound dimensions are slightly smaller and the surface is clean. He has been having difficulty keeping the packing in the wound due to the large foam border dressing that he prefers. 06/03/2022: The wound is smaller and shallower with less undermining. He has built up macerated senescent skin around the margins, as per usual. He and his friend have figured out a way to make sure that his packing stays in the wound with his [place]. 06/12/2022: The wound is a little bit shallower again today. He still has accumulated senescent periwound skin, but otherwise the wound is clean. He will be undergoing bilateral recurrent inguinal hernia repair and umbilical hernia repair next week. 10/20 sacral pressure ulcer. using [facility] and backing wet to dry. Has developed a rash sur rounding the wound 07/03/2022: Continued contracture of the wound. It is very clean and there is no odor or purulent drainage. Continued buildup of senescent skin around the margin. 07/13/2022: There is less undermining present today. The orifice continues to contract. He continues to accumulate senescent skin around the perimeter. The rash around the wound has improved. 07/27/2022: The wound is smaller again today and the undermining is closed then. There is senescent skin accumulation, as usual. 08/07/2022: The wound continues to contract but has its usual accumulation of periwound senescent skin. Unfortunately, the periwound skin looks  like it has gotten more moist. It is red and angry and the patient says it is more  painful. 08/14/2022: The wound is smaller again this week. His periwound skin is in much better condition. There is still senescent skin around the wound margins and a bit of slough on the surface. 12/18; this is a lower sacral pressure ulcer. We have been using silver alginate for the last 2 weeks. This is longstanding and refractory. At 1 point I think had exposed bone he no longer has this now. He tells me that he has had problems with his right lower leg stump and he is going to see hangers she is therefore not walking but using his wheelchair although he says he is in his wheelchair "only 10% of the time]. Otherwise he claims to be rigorous with offloading this. 09/08/2022: The wound is considerably smaller than the last time I saw it. He has, as usual, the senescent skin accumulation around the wound margin. The undermining and depth, as well as the overall dimensions have come in significantly. Patient History Information obtained from Patient, Caregiver, Chart. Family History Cancer - Father, Heart Disease - Father,Paternal Grandparents, Hypertension - Father, No family history of Diabetes, Hereditary Spherocytosis, Kidney Disease, Lung Disease, Seizures, Stroke, Thyroid Problems, Tuberculosis. Social History Never smoker, Marital Status - Divorced, Alcohol Use - Never, Drug Use - No History, Caffeine Use - Moderate - coffee. Medical History Eyes Denies history of Cataracts, Glaucoma, Optic Neuritis Ear/Nose/Mouth/Throat Denies history of Chronic sinus problems/congestion, Middle ear problems Cardiovascular Patient has history of Congestive Heart Failure, Hypertension Endocrine Patient has history of Type II Diabetes Denies history of Type I Diabetes Genitourinary Denies history of End Stage Renal Disease [name], [name] ([id number]) [phone].pdf Page 7 of 10 Integumentary  (Skin) Denies history of History of Burn Musculoskeletal Patient has history of Osteomyelitis - S1 and coccyx Oncologic Denies history of Received Chemotherapy, Received Radiation Psychiatric Patient has history of Confinement Anxiety Denies history of Anorexia/bulimia Hospitalization/Surgery History - bil BKA. Medical A Surgical History Notes nd Respiratory pulmonary nodules Genitourinary renal mass, urinary hesitancy Musculoskeletal discitis of thoracic region Objective Constitutional He is hypertensive, but asymptomatic.Marland Kitchen No acute distress. Vitals Time Taken: 1:20 PM, Temperature: 97.6 F, Pulse: 79 bpm, Respiratory Rate: 16 breaths/min, Blood Pressure: 151/87 mmHg. Respiratory Normal work of breathing on room air. General Notes: 09/08/2022: The wound is considerably smaller than the last time I saw it. He has, as usual, the senescent skin accumulation around the wound margin. The undermining and depth, as well as the overall dimensions have come in significantly. Integumentary (Hair, Skin) Wound #1 status is Open. Original cause of wound was Pressure Injury. The date acquired was: 07/29/2021. The wound has been in treatment 34 weeks. The wound is located on the Sacrum. The wound measures 0.5cm length x 0.6cm width x 0.7cm depth; 0.236cm^2 area and 0.165cm^3 volume. There is Fat Layer (Subcutaneous Tissue) exposed. There is no tunneling noted, however, there is undermining starting at 12:00 and ending at 12:00 with a maximum distance of 0.8cm. There is a medium amount of serosanguineous drainage noted. The wound margin is epibole. There is large (67-100%) pink, pale granulation within the wound bed. There is a small (1-33%) amount of necrotic tissue within the wound bed including Adherent Slough. The periwound skin appearance had no abnormalities noted for color. The periwound skin appearance exhibited: Callus, Excoriation, Rash, Maceration. The periwound skin appearance did not  exhibit: Dry/Scaly. Periwound temperature was noted as No Abnormality. 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 There was a Excisional Skin/Subcutaneous Tissue Debridement with a total area of 0.3 sq cm performed by [name], MD. With the following instrument(s): Curette to remove Viable and Non-Viable tissue/material. Material removed includes Subcutaneous Tissue, Slough, and Skin: Epidermis after achieving pain control using Lidocaine 4% T opical Solution. No specimens were taken. A time out was conducted at 13:32, prior to the start of the procedure. A Minimum amount of bleeding was controlled with Pressure. The procedure was tolerated well. Post Debridement Measurements: 0.5cm length x 0.6cm width x 0.7cm depth; 0.165cm^3 volume. Post debridement Stage noted as Category/Stage IV. Character of Wound/Ulcer Post Debridement is improved. Post procedure Diagnosis Wound #1: Same as Pre-Procedure General Notes: Scribed for Dr. Celine Ahr by Blanche East, RN. [name], [name] ([id number]) [id number] Page 8 of 10 Plan Follow-up Appointments: Return Appointment in 2 weeks. - Dr. Celine Ahr - room 1 Anesthetic: Wound #1 Sacrum: (In clinic) Topical Lidocaine 4% applied to wound bed Off-Loading: Gel mattress overlay (Group 1) Turn and reposition every 2 hours WOUND #1: - Sacrum Wound Laterality: Cleanser: Soap and Water 3 x Per Week/30 Days Discharge Instructions: May shower and wash wound with dial antibacterial soap and water prior to dressing change. Cleanser: Wound Cleanser 3 x Per Week/30 Days Discharge Instructions: Cleanse the wound with wound cleanser prior to applying a clean dressing using gauze sponges, not tissue or cotton balls. Peri-Wound  Care: Skin Prep 3 x Per Week/30 Days Discharge Instructions: Use skin prep as directed Peri-Wound Care: Zinc Oxide Ointment 30g tube 3 x Per Week/30 Days Discharge Instructions: Apply Zinc Oxide to macerated periwound with each dressing change as needed Prim Dressing: Sorbalgon AG Dressing 2x2 (in/in) 3 x Per Week/30 Days ary Discharge Instructions: Apply to wound bed as instructed Secondary Dressing: Zetuvit Plus Silicone Border Sacrum Dressing, Sm, 7x7 (in/in) 3 x Per Week/30 Days Discharge Instructions: Apply silicone border over primary dressing as directed. Secured With: 76M Medipore H Soft Cloth Surgical T ape, 4 x 10 (in/yd) 3 x Per Week/30 Days Discharge Instructions: Secure with tape as directed. Add-Ons: Gloves, Large 3 x Per Week/30 Days 09/08/2022: The wound is considerably smaller than the last time I saw it. He has, as usual, the senescent skin accumulation around the wound margin. The undermining and depth, as well as the overall dimensions have come in significantly. I used a curette to debride slough, nonviable subcutaneous tissue, and senescent skin from the wound. We will continue to pack this with silver alginate. Follow-up in 2 weeks. Electronic Signature(s) Signed: 09/08/2022 1:40:42 PM By: [name] MD FACS Entered By: [name] on 09/08/2022 13:40:42 -------------------------------------------------------------------------------- HxROS Details Patient Name: Date of Service: [name], [name] 09/08/2022 1:15 PM Medical Record Number: [id number] Patient Account Number: [id number] Date of Birth/Sex: Treating RN: Mar 13, 1966 (57 y.o. M) Primary Care Provider: Berniece Pap Other Clinician: Referring Provider: Treating Provider/Extender: Lannie Fields in Treatment: 34 Information Obtained From Patient Caregiver Chart Eyes Medical History: Negative for: Cataracts; Glaucoma; Optic Neuritis Ear/Nose/Mouth/Throat Medical  History: Negative for: Chronic sinus problems/congestion; Middle ear problems Respiratory Medical History: Past Medical History Notes: pulmonary nodules Cardiovascular Medical HistorySTARLIN, [name] ([id number]) [id number] Page 9 of 10 Positive for: Congestive Heart Failure; Hypertension Endocrine Medical History: Positive for: Type II Diabetes Negative for: Type I Diabetes Time with diabetes: since 2004 Treated with: Insulin Blood sugar tested every day: Yes Tested : 4  times per day Genitourinary Medical History: Negative for: End Stage Renal Disease Past Medical History Notes: renal mass, urinary hesitancy Integumentary (Skin) Medical History: Negative for: History of Burn Musculoskeletal Medical History: Positive for: Osteomyelitis - S1 and coccyx Past Medical History Notes: discitis of thoracic region Oncologic Medical History: Negative for: Received Chemotherapy; Received Radiation Psychiatric Medical History: Positive for: Confinement Anxiety Negative for: Anorexia/bulimia Immunizations Pneumococcal Vaccine: Received Pneumococcal Vaccination: No Implantable Devices No devices added Hospitalization / Surgery History Type of Hospitalization/Surgery bil BKA Family and Social History Cancer: Yes - Father; Diabetes: No; Heart Disease: Yes - Father,Paternal Grandparents; Hereditary Spherocytosis: No; Hypertension: Yes - Father; Kidney Disease: No; Lung Disease: No; Seizures: No; Stroke: No; Thyroid Problems: No; Tuberculosis: No; Never smoker; Marital Status - Divorced; Alcohol Use: Never; Drug Use: No History; Caffeine Use: Moderate - coffee; Financial Concerns: No; Food, Clothing or Shelter Needs: No; Support System Lacking: No; Transportation Concerns: Yes - hurts to transfer Electronic Signature(s) Signed: 09/08/2022 1:45:20 PM By: [name] MD FACS Entered By: [name] on 09/08/2022  13:37:56 -------------------------------------------------------------------------------- SuperBill Details Patient Name: Date of Service: [name], [name] 09/08/2022 Medical Record Number: [id number] Patient Account Number: [id number] Date of Birth/Sex: Treating RN: [birth year] (57 y.o. Sheralyn Boatman, Herbie Baltimore ([id number]) [id number] Page 10 of 10 Primary Care Provider: Berniece Pap Other Clinician: Referring Provider: Treating Provider/Extender: Lannie Fields in Treatment: 34 Diagnosis Coding ICD-10 Codes Code Description L89.154 Pressure ulcer of sacral region, stage 4 Z89.512 Acquired absence of left leg below knee Z89.511 Acquired absence of right leg below knee Facility Procedures : CPT4 Code: [id number] Description: Nelson TISSUE 20 SQ CM/< ICD-10 Diagnosis Description L89.154 Pressure ulcer of sacral region, stage 4 Modifier: Quantity: 1 Physician Procedures : CPT4 Code Description Modifier [id number] - WC PHYS LEVEL 4 - EST PT 25 ICD-10 Diagnosis Description L89.154 Pressure ulcer of sacral region, stage 4 Z89.512 Acquired absence of left leg below knee Z89.511 Acquired absence of right leg below knee Quantity: 1 : [id number] - WC PHYS SUBQ TISS 20 SQ CM ICD-10 Diagnosis Description L89.154 Pressure ulcer of sacral region, stage 4 Quantity: 1 Electronic Signature(s) Signed: 09/08/2022 1:40:56 PM By: [name] MD FACS Entered By: [name] on 09/08/2022 13:40:56

## 2022-09-09 ENCOUNTER — Other Ambulatory Visit: Payer: Self-pay

## 2022-09-09 DIAGNOSIS — S88119A Complete traumatic amputation at level between knee and ankle, unspecified lower leg, initial encounter: Secondary | ICD-10-CM

## 2022-09-09 DIAGNOSIS — Z89211 Acquired absence of right upper limb below elbow: Secondary | ICD-10-CM

## 2022-09-09 DIAGNOSIS — R270 Ataxia, unspecified: Secondary | ICD-10-CM

## 2022-09-09 NOTE — Telephone Encounter (Signed)
Spoke with patient, he no longer needs these.

## 2022-09-10 ENCOUNTER — Encounter: Payer: Medicare HMO | Admitting: Physical Therapy

## 2022-09-10 ENCOUNTER — Telehealth: Payer: Self-pay | Admitting: Orthopedic Surgery

## 2022-09-10 NOTE — Telephone Encounter (Signed)
Patient states he has P/T with Shirlean Mylar and he needs someone to call him with the dates of P/T..[phone]

## 2022-09-14 ENCOUNTER — Encounter: Payer: Medicare HMO | Admitting: Physical Therapy

## 2022-09-16 ENCOUNTER — Encounter: Payer: Medicare HMO | Admitting: Physical Therapy

## 2022-09-17 ENCOUNTER — Telehealth: Payer: Self-pay | Admitting: Internal Medicine

## 2022-09-17 NOTE — Telephone Encounter (Signed)
Faxed his last PT treatment note/ conversation with PT about the crutches to [facility]. PT requested the crutches after his last OV with Dr.Morrison (there is no OV note for crutches from [facility]).

## 2022-09-17 NOTE — Telephone Encounter (Signed)
[name] with [facility] is needing to have the last face to face notes for order for crutches. Please fax to [phone], ATTN: Intake

## 2022-09-21 ENCOUNTER — Encounter: Payer: Medicare HMO | Admitting: Physical Therapy

## 2022-09-21 ENCOUNTER — Encounter (HOSPITAL_BASED_OUTPATIENT_CLINIC_OR_DEPARTMENT_OTHER): Payer: Medicare HMO | Admitting: General Surgery

## 2022-09-21 DIAGNOSIS — I110 Hypertensive heart disease with heart failure: Secondary | ICD-10-CM | POA: Diagnosis not present

## 2022-09-21 DIAGNOSIS — E119 Type 2 diabetes mellitus without complications: Secondary | ICD-10-CM | POA: Diagnosis not present

## 2022-09-21 DIAGNOSIS — F419 Anxiety disorder, unspecified: Secondary | ICD-10-CM | POA: Diagnosis not present

## 2022-09-21 DIAGNOSIS — M4628 Osteomyelitis of vertebra, sacral and sacrococcygeal region: Secondary | ICD-10-CM | POA: Diagnosis not present

## 2022-09-21 DIAGNOSIS — Z89512 Acquired absence of left leg below knee: Secondary | ICD-10-CM | POA: Diagnosis not present

## 2022-09-21 DIAGNOSIS — I509 Heart failure, unspecified: Secondary | ICD-10-CM | POA: Diagnosis not present

## 2022-09-21 DIAGNOSIS — Z89511 Acquired absence of right leg below knee: Secondary | ICD-10-CM | POA: Diagnosis not present

## 2022-09-21 DIAGNOSIS — G8929 Other chronic pain: Secondary | ICD-10-CM | POA: Diagnosis not present

## 2022-09-21 DIAGNOSIS — L89154 Pressure ulcer of sacral region, stage 4: Secondary | ICD-10-CM | POA: Diagnosis not present

## 2022-09-21 NOTE — Progress Notes (Signed)
[name], [name] ([id number]) [id number] Page 1 of 10 Visit Report for 09/21/2022 Chief Complaint Document Details Patient Name: Date of Service: [place], [name] 09/21/2022 2:00 PM Medical Record Number: [id number] Patient Account Number: [id number] Date of Birth/Sex: Treating RN: [birth year] (57 y.o. M) Primary Care Provider: Berniece Pap Other Clinician: Referring Provider: Treating Provider/Extender: Lannie Fields in Treatment: 36 Information Obtained from: Patient Chief Complaint Patient is at the clinic for treatment of an open pressure ulcer Electronic Signature(s) Signed: 09/21/2022 2:55:58 PM By: [name] MD FACS Entered By: [name] on 09/21/2022 14:55:58 -------------------------------------------------------------------------------- HPI Details Patient Name: Date of Service: [name], [name] 09/21/2022 2:00 PM Medical Record Number: [id number] Patient Account Number: [id number] Date of Birth/Sex: Treating RN: 23-Apr-1966 (57 y.o. M) Primary Care Provider: Berniece Pap Other Clinician: Referring Provider: Treating Provider/Extender: Lannie Fields in Treatment: 36 History of Present Illness HPI Description: ADMISSION 01/08/2022 This is a 57 year old male with a past medical history notable for type 2 diabetes mellitus (last A1c was 8.6) congestive heart failure, hypertension, chronic pain, and bilateral below-knee amputations. His most recent amputation was in November 2022. While he was in the hospital, he developed a sacral pressure ulcer. He was subsequently in a skilled nursing facility for some time. He was discharged with home health and had been in a wound VAC, but was then admitted to the hospital last week when the wound appeared to be worsening. Apparently the periwound skin was macerated and the device that he had been using was leaking quite a bit. Evaluation while in the  hospital included a consultation with infectious disease who did not think he had any evidence of osteomyelitis, plastic surgery who felt that he was not an appropriate flap candidate (their note also states that he is not interested in a flap) and wound care who took him out of the wound VAC and initiated wet-to-dry dressing changes. He has a new wound VAC from [facility] on order, anticipated delivery today. The wound itself is fairly small and isolated to the sacrum. There is muscle exposed. No bone is appreciated but it is palpable beneath the surface. The muscle itself is bit pale and there is heaped up epibole around the wound edges. No significant odor or drainage. 01/14/2022: His wound VAC was not initiated until this past Friday. He has not had any issues with the VAC but today we found that the bridge foam was applied directly to the skin rather than over a layer of adhesive drape. His home health nurse also requested that we consider applying silver collagen to the wound bed in addition to the VAC. There is still a little bit of heaped up senescent skin around the wound periphery. No significant change to the wound dimensions. 01/21/2022: No significant change to the wound dimensions. The senescent skin has not reaccumulated. The periwound skin remains a bit macerated but without any obvious breakdown. The wound surface itself has a shiny appearance with a little bit of slough accumulation; no true robust granulation tissue at this time. 01/28/2022: The wound is about the same size but a little bit shallower. There is a little bit of senescent skin reaccumulation at the cranial aspect. The periwound skin is red but not macerated and without any tissue breakdown. The wound still does not have the most robust surface. There is a bit of slough accumulation. 02/04/2022: The wound is a bit smaller and the undermining has come in somewhat. There continues to be granulation tissue  formation within the wound  bed. No significant slough accumulation and his periwound skin is in better condition. 02/18/2022: The wound stinks today. There is no obvious pus but the drainage and wound itself are malodorous. He continues to have heaped up tissue within the wound bed that is rather grayish and not particularly robust-appearing. He is very angry today with his situation. 02/25/2022: Last week, in response to the odor coming from the wound, I took a culture and prescribed topical gentamicin as well as oral cefdinir. Apparently MARSHALL, KAMPF ([id number]) [id number] Page 2 of 10 [employer] contacted him about a topical compounded antibiotic, but he did not realize this and he hung up on them. Today, the odor is no longer present. There is some senescent skin around the wound margins as well as continued heaped up granulation tissue in the center of the wound. The undermining continues to contract. 03/04/2022: The wound continues to contract and look better. He still has heaped up hypertrophic granulation tissue near the orifice. No odor was coming from the wound today. He is awaiting [place] delivery. 03/18/2022: 2-week follow-up. [facility] topical antibiotic has been initiated. The chemical cauterization of the hypertrophic granulation tissue was quite successful and the surface is much flatter today. He has heaped up senescent skin around the perimeter. His home health providers have figured out a way to keep the wound VAC from losing suction by bolstering with DuoDERM. Overall there has been significant improvement since our last visit. 04/01/2022: 2-week follow-up. The wound continues to contract. Once again, there is heaped up senescent skin around the perimeter. He has a little bit of skin breakdown in the distribution of the adhesive drape from the wound VAC. No odor or purulent drainage. No concern for infection. 04/15/2022: 2-week follow-up. He has developed a fairly substantial rash  from the drape adhesive for his wound VAC. The periwound has a lot of heaped up epiboly at the margins. The tissue in the wound bed is a little bit pale but there is no odor or purulent drainage. 04/22/2022: Last week we discontinued the VAC. Today, the skin around his wound is in significantly improved condition. His rash is resolving. He does have some heaped up senescent skin around the wound margin and a layer of slough on the wound surface, but there is no concern for active infection. 04/29/2022: The wound continues to contract. The surface is clean. He continues to build up senescent skin around the wound edges that subsequently get a little bit macerated. No concern for infection. 05/06/2022: The wound is smaller again today in all dimensions. The surface is clean. He has his usual accumulation of macerated senescent skin around the wound edges. 05/13/2022: Continued wound contracture. The undermining has decreased quite a bit. Clean surface. Senescent skin heaped up around the wound edges, as per usual. 05/22/2022: The macerated senescent skin has accumulated once again. The wound dimensions are slightly smaller and the surface is clean. He has been having difficulty keeping the packing in the wound due to the large foam border dressing that he prefers. 06/03/2022: The wound is smaller and shallower with less undermining. He has built up macerated senescent skin around the margins, as per usual. He and his friend have figured out a way to make sure that his packing stays in the wound with his [place]. 06/12/2022: The wound is a little bit shallower again today. He still has accumulated senescent periwound skin, but otherwise the wound is clean. He will be undergoing bilateral recurrent inguinal hernia  repair and umbilical hernia repair next week. 10/20 sacral pressure ulcer. using [facility]stone and backing wet to dry. Has developed a rash sur rounding the wound 07/03/2022: Continued contracture of the  wound. It is very clean and there is no odor or purulent drainage. Continued buildup of senescent skin around the margin. 07/13/2022: There is less undermining present today. The orifice continues to contract. He continues to accumulate senescent skin around the perimeter. The rash around the wound has improved. 07/27/2022: The wound is smaller again today and the undermining is closed then. There is senescent skin accumulation, as usual. 08/07/2022: The wound continues to contract but has its usual accumulation of periwound senescent skin. Unfortunately, the periwound skin looks like it has gotten more moist. It is red and angry and the patient says it is more painful. 08/14/2022: The wound is smaller again this week. His periwound skin is in much better condition. There is still senescent skin around the wound margins and a bit of slough on the surface. 12/18; this is a lower sacral pressure ulcer. We have been using silver alginate for the last 2 weeks. This is longstanding and refractory. At 1 point I think had exposed bone he no longer has this now. He tells me that he has had problems with his right lower leg stump and he is going to see hangers she is therefore not walking but using his wheelchair although he says he is in his wheelchair "only 10% of the time]. Otherwise he claims to be rigorous with offloading this. 09/08/2022: The wound is considerably smaller than the last time I saw it. He has, as usual, the senescent skin accumulation around the wound margin. The undermining and depth, as well as the overall dimensions have come in significantly. 09/21/2022: The wound dimensions are roughly the same, no significant accumulation of periwound senescent skin. There is some reaccumulation of hypertrophic granulation tissue on the wound bed, however. He is spending a bit more time in his wheelchair due to issues with his prosthetics, but he is hopeful that this will be resolved within the next week  or so. Electronic Signature(s) Signed: 09/21/2022 2:56:57 PM By: [name] MD FACS Entered By: [name] on 09/21/2022 14:56:57 -------------------------------------------------------------------------------- Chemical Cauterization Details Patient Name: Date of Service: [name], [name] 09/21/2022 2:00 PM Medical Record Number: [id number] Patient Account Number: [id number] Date of Birth/Sex: Treating RN: December 01, 1965 (57 y.o. [name] Primary Care Provider: Berniece Pap Other Clinician: Referring Provider: Treating Provider/Extender: Lannie Fields in Treatment: [address], [place] ([id number]) [id number] Page 3 of 10 Procedure Performed for: Wound #1 Sacrum Performed By: Physician [name], MD Post Procedure Diagnosis Same as Pre-procedure Notes using silver nitrate sticks Electronic Signature(s) Signed: 09/21/2022 3:23:09 PM By: [name] MD FACS Signed: 09/21/2022 4:59:31 PM By: Baruch Gouty RN, BSN Entered By: Baruch Gouty on 09/21/2022 14:27:04 -------------------------------------------------------------------------------- Physical Exam Details Patient Name: Date of Service: [name], [place] 09/21/2022 2:00 PM Medical Record Number: [id number] Patient Account Number: [id number] Date of Birth/Sex: Treating RN: March 02, 1966 (57 y.o. M) Primary Care Provider: Berniece Pap Other Clinician: Referring Provider: Treating Provider/Extender: Lannie Fields in Treatment: 40 Constitutional . . . no acute distress. Respiratory Normal work of breathing on room air. Notes 09/21/2022: The wound dimensions are roughly the same, no significant accumulation of periwound senescent skin. There is some reaccumulation of hypertrophic granulation tissue in the wound bed, however. Electronic Signature(s) Signed: 09/21/2022 2:57:35 PM By: [name] MD FACS Entered  By:  [name] on 09/21/2022 14:57:35 -------------------------------------------------------------------------------- Physician Orders Details Patient Name: Date of Service: [facility] [place], [place] 09/21/2022 2:00 PM Medical Record Number: [id number] Patient Account Number: [id number] Date of Birth/Sex: Treating RN: April 25, 1966 (57 y.o. [name] Primary Care Provider: Berniece Pap Other Clinician: Referring Provider: Treating Provider/Extender: Lannie Fields in Treatment: 64 Verbal / Phone Orders: No Diagnosis Coding ICD-10 Coding Code Description L89.154 Pressure ulcer of sacral region, stage 4 Z89.512 Acquired absence of left leg below knee Z89.511 Acquired absence of right leg below knee Legate, [name] ([id number]) [id number] Page 4 of 10 Follow-up Appointments ppointment in 2 weeks. - Dr. Celine Ahr - room 1 Return A Monday 1/29 @ 1:15 pm Anesthetic Wound #1 Sacrum (In clinic) Topical Lidocaine 4% applied to wound bed Off-Loading Gel mattress overlay (Group 1) Turn and reposition every 2 hours Wound Treatment Wound #1 - Sacrum Cleanser: Soap and Water 3 x Per Week/30 Days Discharge Instructions: May shower and wash wound with dial antibacterial soap and water prior to dressing change. Cleanser: Wound Cleanser 3 x Per Week/30 Days Discharge Instructions: Cleanse the wound with wound cleanser prior to applying a clean dressing using gauze sponges, not tissue or cotton balls. Peri-Wound Care: Skin Prep 3 x Per Week/30 Days Discharge Instructions: Use skin prep as directed Peri-Wound Care: Zinc Oxide Ointment 30g tube 3 x Per Week/30 Days Discharge Instructions: Apply Zinc Oxide to macerated periwound with each dressing change as needed Prim Dressing: Sorbalgon AG Dressing 2x2 (in/in) 3 x Per Week/30 Days ary Discharge Instructions: Apply to wound bed as instructed Secondary Dressing: Zetuvit Plus Silicone Border Sacrum  Dressing, Sm, 7x7 (in/in) (Generic) 3 x Per Week/30 Days Discharge Instructions: Apply silicone border over primary dressing as directed. Secured With: 104M Medipore H Soft Cloth Surgical T ape, 4 x 10 (in/yd) (Generic) 3 x Per Week/30 Days Discharge Instructions: Secure with tape as directed. Add-Ons: Gloves, Large 3 x Per Week/30 Days Electronic Signature(s) Signed: 09/21/2022 3:23:09 PM By: [name] MD FACS Entered By: [name] on 09/21/2022 14:57:47 -------------------------------------------------------------------------------- Problem List Details Patient Name: Date of Service: [name], [place] 09/21/2022 2:00 PM Medical Record Number: [id number] Patient Account Number: [id number] Date of Birth/Sex: Treating RN: [birth year] (57 y.o. [name] Primary Care Provider: Berniece Pap Other Clinician: Referring Provider: Treating Provider/Extender: Lannie Fields in Treatment: 36 Active Problems ICD-10 Encounter Code Description Active Date MDM Diagnosis L89.154 Pressure ulcer of sacral region, stage 4 01/08/2022 No Yes Z89.512 Acquired absence of left leg below knee 01/08/2022 No Yes Z89.511 Acquired absence of right leg below knee 01/08/2022 No Yes Paras, Raman ([id number]) [phone].pdf Page 5 of 10 Inactive Problems Resolved Problems Electronic Signature(s) Signed: 09/21/2022 2:55:42 PM By: [name] MD FACS Entered By: [name] on 09/21/2022 14:55:42 -------------------------------------------------------------------------------- Progress Note Details Patient Name: Date of Service: [name], [place] 09/21/2022 2:00 PM Medical Record Number: [id number] Patient Account Number: [id number] Date of Birth/Sex: Treating RN: 14-Dec-1965 (57 y.o. M) Primary Care Provider: Berniece Pap Other Clinician: Referring Provider: Treating Provider/Extender: Lannie Fields in Treatment:  36 Subjective Chief Complaint Information obtained from Patient Patient is at the clinic for treatment of an open pressure ulcer History of Present Illness (HPI) ADMISSION 01/08/2022 This is a 57 year old male with a past medical history notable for type 2 diabetes mellitus (last A1c was 8.6) congestive heart failure, hypertension, chronic pain, and bilateral below-knee amputations. His most recent amputation was in November 2022. While he  was in the hospital, he developed a sacral pressure ulcer. He was subsequently in a skilled nursing facility for some time. He was discharged with home health and had been in a wound VAC, but was then admitted to the hospital last week when the wound appeared to be worsening. Apparently the periwound skin was macerated and the device that he had been using was leaking quite a bit. Evaluation while in the hospital included a consultation with infectious disease who did not think he had any evidence of osteomyelitis, plastic surgery who felt that he was not an appropriate flap candidate (their note also states that he is not interested in a flap) and wound care who took him out of the wound VAC and initiated wet-to-dry dressing changes. He has a new wound VAC from [facility] on order, anticipated delivery today. The wound itself is fairly small and isolated to the sacrum. There is muscle exposed. No bone is appreciated but it is palpable beneath the surface. The muscle itself is bit pale and there is heaped up epibole around the wound edges. No significant odor or drainage. 01/14/2022: His wound VAC was not initiated until this past Friday. He has not had any issues with the VAC but today we found that the bridge foam was applied directly to the skin rather than over a layer of adhesive drape. His home health nurse also requested that we consider applying silver collagen to the wound bed in addition to the VAC. There is still a little bit of heaped up senescent skin around  the wound periphery. No significant change to the wound dimensions. 01/21/2022: No significant change to the wound dimensions. The senescent skin has not reaccumulated. The periwound skin remains a bit macerated but without any obvious breakdown. The wound surface itself has a shiny appearance with a little bit of slough accumulation; no true robust granulation tissue at this time. 01/28/2022: The wound is about the same size but a little bit shallower. There is a little bit of senescent skin reaccumulation at the cranial aspect. The periwound skin is red but not macerated and without any tissue breakdown. The wound still does not have the most robust surface. There is a bit of slough accumulation. 02/04/2022: The wound is a bit smaller and the undermining has come in somewhat. There continues to be granulation tissue formation within the wound bed. No significant slough accumulation and his periwound skin is in better condition. 02/18/2022: The wound stinks today. There is no obvious pus but the drainage and wound itself are malodorous. He continues to have heaped up tissue within the wound bed that is rather grayish and not particularly robust-appearing. He is very angry today with his situation. 02/25/2022: Last week, in response to the odor coming from the wound, I took a culture and prescribed topical gentamicin as well as oral cefdinir. Apparently [facility] contacted him about a topical compounded antibiotic, but he did not realize this and he hung up on them. Today, the odor is no longer present. There is some senescent skin around the wound margins as well as continued heaped up granulation tissue in the center of the wound. The undermining continues to contract. 03/04/2022: The wound continues to contract and look better. He still has heaped up hypertrophic granulation tissue near the orifice. No odor was coming from the wound today. He is awaiting [place] delivery. 03/18/2022: 2-week follow-up.  [facility] topical antibiotic has been initiated. The chemical cauterization of the hypertrophic granulation tissue was quite successful and the  surface is much flatter today. He has heaped up senescent skin around the perimeter. His home health providers have figured out a way to keep the wound VAC from losing suction by bolstering with DuoDERM. Overall there has been significant improvement since our last visit. 04/01/2022: 2-week follow-up. The wound continues to contract. Once again, there is heaped up senescent skin around the perimeter. He has a little bit of skin breakdown in the distribution of the adhesive drape from the wound VAC. No odor or purulent drainage. No concern for infection. 04/15/2022: 2-week follow-up. He has developed a fairly substantial rash from the drape adhesive for his wound VAC. The periwound has a lot of heaped up epiboly at the margins. The tissue in the wound bed is a little bit pale but there is no odor or purulent drainage. SARATH, PRIVOTT ([id number]) [id number] Page 6 of 10 04/22/2022: Last week we discontinued the VAC. Today, the skin around his wound is in significantly improved condition. His rash is resolving. He does have some heaped up senescent skin around the wound margin and a layer of slough on the wound surface, but there is no concern for active infection. 04/29/2022: The wound continues to contract. The surface is clean. He continues to build up senescent skin around the wound edges that subsequently get a little bit macerated. No concern for infection. 05/06/2022: The wound is smaller again today in all dimensions. The surface is clean. He has his usual accumulation of macerated senescent skin around the wound edges. 05/13/2022: Continued wound contracture. The undermining has decreased quite a bit. Clean surface. Senescent skin heaped up around the wound edges, as per usual. 05/22/2022: The macerated senescent skin has accumulated  once again. The wound dimensions are slightly smaller and the surface is clean. He has been having difficulty keeping the packing in the wound due to the large foam border dressing that he prefers. 06/03/2022: The wound is smaller and shallower with less undermining. He has built up macerated senescent skin around the margins, as per usual. He and his friend have figured out a way to make sure that his packing stays in the wound with his [place]. 06/12/2022: The wound is a little bit shallower again today. He still has accumulated senescent periwound skin, but otherwise the wound is clean. He will be undergoing bilateral recurrent inguinal hernia repair and umbilical hernia repair next week. 10/20 sacral pressure ulcer. using [facility] and backing wet to dry. Has developed a rash sur rounding the wound 07/03/2022: Continued contracture of the wound. It is very clean and there is no odor or purulent drainage. Continued buildup of senescent skin around the margin. 07/13/2022: There is less undermining present today. The orifice continues to contract. He continues to accumulate senescent skin around the perimeter. The rash around the wound has improved. 07/27/2022: The wound is smaller again today and the undermining is closed then. There is senescent skin accumulation, as usual. 08/07/2022: The wound continues to contract but has its usual accumulation of periwound senescent skin. Unfortunately, the periwound skin looks like it has gotten more moist. It is red and angry and the patient says it is more painful. 08/14/2022: The wound is smaller again this week. His periwound skin is in much better condition. There is still senescent skin around the wound margins and a bit of slough on the surface. 12/18; this is a lower sacral pressure ulcer. We have been using silver alginate for the last 2 weeks. This is longstanding and refractory. At 1 point  I think had exposed bone he no longer has this now. He tells  me that he has had problems with his right lower leg stump and he is going to see hangers she is therefore not walking but using his wheelchair although he says he is in his wheelchair "only 10% of the time]. Otherwise he claims to be rigorous with offloading this. 09/08/2022: The wound is considerably smaller than the last time I saw it. He has, as usual, the senescent skin accumulation around the wound margin. The undermining and depth, as well as the overall dimensions have come in significantly. 09/21/2022: The wound dimensions are roughly the same, no significant accumulation of periwound senescent skin. There is some reaccumulation of hypertrophic granulation tissue on the wound bed, however. He is spending a bit more time in his wheelchair due to issues with his prosthetics, but he is hopeful that this will be resolved within the next week or so. Patient History Information obtained from Patient, Caregiver, Chart. Family History Cancer - Father, Heart Disease - Father,Paternal Grandparents, Hypertension - Father, No family history of Diabetes, Hereditary Spherocytosis, Kidney Disease, Lung Disease, Seizures, Stroke, Thyroid Problems, Tuberculosis. Social History Never smoker, Marital Status - Divorced, Alcohol Use - Never, Drug Use - No History, Caffeine Use - Moderate - coffee. Medical History Eyes Denies history of Cataracts, Glaucoma, Optic Neuritis Ear/Nose/Mouth/Throat Denies history of Chronic sinus problems/congestion, Middle ear problems Cardiovascular Patient has history of Congestive Heart Failure, Hypertension Endocrine Patient has history of Type II Diabetes Denies history of Type I Diabetes Genitourinary Denies history of End Stage Renal Disease Integumentary (Skin) Denies history of History of Burn Musculoskeletal Patient has history of Osteomyelitis - S1 and coccyx Oncologic Denies history of Received Chemotherapy, Received Radiation Psychiatric Patient has history  of Confinement Anxiety Denies history of Anorexia/bulimia Hospitalization/Surgery History - bil BKA. Medical A Surgical History Notes nd Respiratory pulmonary nodules Genitourinary renal mass, urinary hesitancy Musculoskeletal discitis of thoracic region Otis, [name] ([id number]) [phone].pdf Page 7 of 10 Objective Constitutional no acute distress. Vitals Time Taken: 2:10 AM, Height: 75 in, Weight: 220 lbs, BMI: 27.5, Temperature: 97.6 F, Pulse: 92 bpm, Respiratory Rate: 18 breaths/min, Capillary Blood Glucose: 125 mg/dl. Respiratory Normal work of breathing on room air. General Notes: 09/21/2022: The wound dimensions are roughly the same, no significant accumulation of periwound senescent skin. There is some reaccumulation of hypertrophic granulation tissue in the wound bed, however. Integumentary (Hair, Skin) Wound #1 status is Open. Original cause of wound was Pressure Injury. The date acquired was: 07/29/2021. The wound has been in treatment 36 weeks. The wound is located on the Sacrum. The wound measures 0.5cm length x 0.5cm width x 0.6cm depth; 0.196cm^2 area and 0.118cm^3 volume. There is Fat Layer (Subcutaneous Tissue) exposed. There is no tunneling noted, however, there is undermining starting at 12:00 and ending at 6:00 with a maximum distance of 0.9cm. There is a medium amount of serosanguineous drainage noted. The wound margin is epibole. There is large (67-100%) pink, pale granulation within the wound bed. There is a small (1-33%) amount of necrotic tissue within the wound bed including Adherent Slough. The periwound skin appearance had no abnormalities noted for color. The periwound skin appearance exhibited: Callus, Excoriation. The periwound skin appearance did not exhibit: Rash, Dry/Scaly, Maceration. Periwound temperature was noted as No Abnormality. 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 An Chemical Cauterization procedure was performed by [name], MD. Post procedure Diagnosis Wound #1: Same as Pre-Procedure Notes: using silver nitrate sticks Plan Follow-up Appointments: Return Appointment in 2 weeks. - Dr. Celine Ahr - room 1 Monday 1/29 @ 1:15 pm Anesthetic: Wound #1 Sacrum: (In clinic) Topical Lidocaine 4% applied to wound bed Off-Loading: Gel mattress overlay (Group 1) Turn and reposition every 2 hours WOUND #1: - Sacrum Wound Laterality: Cleanser: Soap and Water 3 x Per Week/30 Days Discharge Instructions: May shower and wash wound with dial antibacterial soap and water prior to dressing change. Cleanser: Wound Cleanser 3 x Per Week/30 Days Discharge Instructions: Cleanse the wound with wound cleanser prior to applying a clean dressing using gauze sponges, not tissue or cotton balls. Peri-Wound Care: Skin Prep 3 x Per Week/30 Days Discharge Instructions: Use skin prep as directed Peri-Wound Care: Zinc Oxide Ointment 30g tube 3 x Per Week/30 Days Discharge Instructions: Apply Zinc Oxide to macerated periwound with each dressing change as needed Prim Dressing: Sorbalgon AG Dressing 2x2 (in/in) 3 x Per Week/30 Days ary Discharge Instructions: Apply to wound bed as instructed Secondary Dressing: Zetuvit Plus Silicone Border Sacrum Dressing, Sm, 7x7 (in/in) (Generic) 3 x Per Week/30 Days [name], [name] ([id number]) [phone].pdf Page 8 of 10 Discharge Instructions: Apply silicone border over primary dressing as directed. Secured With: 25M Medipore H Soft Cloth Surgical T ape, 4 x 10 (in/yd) (Generic) 3 x Per Week/30 Days Discharge Instructions: Secure with tape as directed. Add-Ons: Gloves, Large 3 x Per Week/30 Days 09/21/2022: The wound dimensions are roughly the  same, no significant accumulation of periwound senescent skin. There is some reaccumulation of hypertrophic granulation tissue on the wound bed, however. No debridement was necessary today. I did chemically cauterized the hypertrophic granulation tissue with silver nitrate. We will continue to pack the wound with silver alginate. Hopefully once he is able to get out of his wheelchair and onto his prosthetics more regularly, his wound will continue to close in a more rapid fashion. Follow-up in 2 weeks. Electronic Signature(s) Signed: 09/21/2022 2:59:46 PM By: [name] MD FACS Entered By: [name] on 09/21/2022 14:59:45 -------------------------------------------------------------------------------- HxROS Details Patient Name: Date of Service: [name], [place] 09/21/2022 2:00 PM Medical Record Number: [id number] Patient Account Number: [id number] Date of Birth/Sex: Treating RN: 23-Jan-1966 (57 y.o. M) Primary Care Provider: Berniece Pap Other Clinician: Referring Provider: Treating Provider/Extender: Lannie Fields in Treatment: 36 Information Obtained From Patient Caregiver Chart Eyes Medical History: Negative for: Cataracts; Glaucoma; Optic Neuritis Ear/Nose/Mouth/Throat Medical History: Negative for: Chronic sinus problems/congestion; Middle ear problems Respiratory Medical History: Past Medical History Notes: pulmonary nodules Cardiovascular Medical History: Positive for: Congestive Heart Failure; Hypertension Endocrine Medical History: Positive for: Type II Diabetes Negative for: Type I Diabetes Time with diabetes: since 2004 Treated with: Insulin Blood sugar tested every day: Yes Tested : 4 times per day Genitourinary Medical History: Negative for: End Stage Renal Disease Past Medical History Notes: renal mass, urinary hesitancy [name], [name] ([id number]) [phone].pdf Page 9 of 10 Integumentary  (Skin) Medical History: Negative for: History of Burn Musculoskeletal Medical History: Positive for: Osteomyelitis - S1 and coccyx Past Medical History Notes: discitis of thoracic region Oncologic Medical History: Negative for: Received Chemotherapy; Received Radiation Psychiatric Medical History: Positive for: Confinement Anxiety Negative for: Anorexia/bulimia Immunizations Pneumococcal Vaccine: Received Pneumococcal Vaccination: No Implantable Devices No devices added Hospitalization / Surgery History Type of Hospitalization/Surgery bil BKA  Family and Social History Cancer: Yes - Father; Diabetes: No; Heart Disease: Yes - Father,Paternal Grandparents; Hereditary Spherocytosis: No; Hypertension: Yes - Father; Kidney Disease: No; Lung Disease: No; Seizures: No; Stroke: No; Thyroid Problems: No; Tuberculosis: No; Never smoker; Marital Status - Divorced; Alcohol Use: Never; Drug Use: No History; Caffeine Use: Moderate - coffee; Financial Concerns: No; Food, Clothing or Shelter Needs: No; Support System Lacking: No; Transportation Concerns: Yes - hurts to transfer Electronic Signature(s) Signed: 09/21/2022 3:23:09 PM By: [name] MD FACS Entered By: [name] on 09/21/2022 14:57:03 -------------------------------------------------------------------------------- SuperBill Details Patient Name: Date of Service: [name], [place] 09/21/2022 Medical Record Number: [id number] Patient Account Number: [id number] Date of Birth/Sex: Treating RN: 02/15/1966 (57 y.o. M) Primary Care Provider: Berniece Pap Other Clinician: Referring Provider: Treating Provider/Extender: Lannie Fields in Treatment: 36 Diagnosis Coding ICD-10 Codes Code Description L89.154 Pressure ulcer of sacral region, stage 4 Z89.512 Acquired absence of left leg below knee Z89.511 Acquired absence of right leg below knee Facility Procedures : TYTUS, STRAHLE Code: [id number]  [place] ([id number]) ICD-10 L89.15 Description: 58099 - CHEM CAUT GRANULATION TISS ICD-10 Diagnosis Description [phone] Diagnosis Description 4 Pressure ulcer of sacral region, stage 4 Modifier: [email or web address] Quantity: 1 f Page 10 of 10 Physician Procedures : CPT4 Code Description Modifier [id number] - WC PHYS LEVEL 4 - EST PT ICD-10 Diagnosis Description L89.154 Pressure ulcer of sacral region, stage 4 Z89.512 Acquired absence of left leg below knee Z89.511 Acquired absence of right leg below knee Quantity: 1 : [id number] - WC PHYS CHEM CAUT GRAN TISSUE ICD-10 Diagnosis Description L89.154 Pressure ulcer of sacral region, stage 4 Quantity: 1 Electronic Signature(s) Signed: 09/21/2022 3:00:11 PM By: [name] MD FACS Entered By: [name] on 09/21/2022 15:00:10

## 2022-09-21 NOTE — Progress Notes (Signed)
[name], [name] ([id number]) [id number] Page 1 of 7 Visit Report for 09/21/2022 Arrival Information Details Patient Name: Date of Service: [name], [name] 09/21/2022 2:00 PM Medical Record Number: [id number] Patient Account Number: [id number] Date of Birth/Sex: Treating RN: [name] (57 y.o. M) Primary Care [name]: [name] Other Clinician: Referring [name]: Treating [name]/Extender: [name] in Treatment: 36 Visit Information History Since Last Visit All ordered tests and consults were completed: No Patient Arrived: Wheel Chair Added or deleted any medications: No Arrival Time: 14:08 Any new allergies or adverse reactions: No Accompanied By: wife Had a fall or experienced change in No Transfer Assistance: None activities of daily living that [name] affect Patient Identification Verified: Yes risk of falls: Secondary Verification Process Completed: Yes Signs or symptoms of abuse/neglect since last visito No Patient Requires Transmission-Based Precautions: No Hospitalized since last visit: No Patient Has Alerts: No Implantable device outside of the clinic excluding No cellular tissue based products placed in the center since last visit: Pain Present Now: No Electronic Signature(s) Signed: 09/21/2022 2:20:07 PM By: [name] Entered By: [name] on 09/21/2022 14:09:26 -------------------------------------------------------------------------------- Encounter Discharge Information Details Patient Name: Date of Service: [name], [name] 09/21/2022 2:00 PM Medical Record Number: [id number] Patient Account Number: [id number] Date of Birth/Sex: Treating RN: [birth year] (57 y.o. [name] Primary Care Kaniesha Barile: [name] Other Clinician: Referring [name]: Treating [name]/Extender: [name] in Treatment: 36 Encounter Discharge Information Items Discharge Condition:  Stable Ambulatory Status: Wheelchair Discharge Destination: Home Transportation: Private Auto Accompanied By: friend Schedule Follow-up Appointment: Yes Clinical Summary of Care: Patient Declined Electronic Signature(s) Signed: 09/21/2022 4:59:31 PM By: Baruch Gouty RN, BSN Entered By: Baruch Gouty on 09/21/2022 14:38:26 [name] ([id number]) [phone].pdf Page 2 of 7 -------------------------------------------------------------------------------- Multi Wound Chart Details Patient Name: Date of Service: [name], [name] 09/21/2022 2:00 PM Medical Record Number: [id number] Patient Account Number: [id number] Date of Birth/Sex: Treating RN: [name] (57 y.o. M) Primary Care Vannia Pola: [name] Other Clinician: Referring Kristiane Morsch: Treating [name]/Extender: [name] in Treatment: 36 Vital Signs Height(in): 75 Capillary Blood Glucose(mg/dl): 125 Weight(lbs): 220 Pulse(bpm): 39 Body Mass Index(BMI): 27.5 Blood Pressure(mmHg): Temperature(F): 97.6 Respiratory Rate(breaths/min): 18 [1:Photos:] [N/A:N/A] Sacrum N/A N/A Wound Location: Pressure Injury N/A N/A Wounding Event: Pressure Ulcer N/A N/A Primary Etiology: Congestive Heart Failure, N/A N/A Comorbid History: Hypertension, Type II Diabetes, Osteomyelitis, Confinement Anxiety 07/29/2021 N/A N/A Date Acquired: 63 N/A N/A Weeks of Treatment: Open N/A N/A Wound Status: No N/A N/A Wound Recurrence: 0.5x0.5x0.6 N/A N/A Measurements L x W x D (cm) 0.196 N/A N/A A (cm) : rea 0.118 N/A N/A Volume (cm) : 96.70% N/A N/A % Reduction in A rea: 98.20% N/A N/A % Reduction in Volume: 12 Starting Position 1 (o'clock): 6 Ending Position 1 (o'clock): 0.9 Maximum Distance 1 (cm): Yes N/A N/A Undermining: Category/Stage IV N/A N/A Classification: Medium N/A N/A Exudate A mount: Serosanguineous N/A N/A Exudate Type: red, brown N/A N/A Exudate  Color: Epibole N/A N/A Wound Margin: Large (67-100%) N/A N/A Granulation A mount: Pink, Pale N/A N/A Granulation Quality: Small (1-33%) N/A N/A Necrotic A mount: Fat Layer (Subcutaneous Tissue): Yes N/A N/A Exposed Structures: Fascia: No Tendon: No Muscle: No Joint: No Bone: No Small (1-33%) N/A N/A Epithelialization: Excoriation: Yes N/A N/A Periwound Skin Texture: Callus: Yes Rash: No Maceration: No N/A N/A Periwound Skin Moisture: Dry/Scaly: No No Abnormalities Noted N/A N/A Periwound Skin Color: No Abnormality N/A N/A Temperature: Chemical Cauterization N/A N/A Procedures  Performed: Treatment Notes Wound #1 [name], [name] ([id number]) [id number] Page 3 of 7 Cleanser Soap and Water Discharge Instruction: [name] shower and wash wound with dial antibacterial soap and water prior to dressing change. Wound Cleanser Discharge Instruction: Cleanse the wound with wound cleanser prior to applying a clean dressing using gauze sponges, not tissue or cotton balls. Peri-Wound Care Skin Prep Discharge Instruction: Use skin prep as directed Zinc Oxide Ointment 30g tube Discharge Instruction: Apply Zinc Oxide to macerated periwound with each dressing change as needed Topical Primary Dressing Sorbalgon AG Dressing 2x2 (in/in) Discharge Instruction: Apply to wound bed as instructed Secondary Dressing Zetuvit Plus Silicone Border Sacrum Dressing, Sm, 7x7 (in/in) Discharge Instruction: Apply silicone border over primary dressing as directed. Secured With 28M Medipore H Soft Cloth Surgical T ape, 4 x 10 (in/yd) Discharge Instruction: Secure with tape as directed. Compression Wrap Compression Stockings Add-Ons Gloves, Large Electronic Signature(s) Signed: 09/21/2022 2:55:52 PM By: [name] Maudlin [name] Entered By: [name] on 09/21/2022  14:55:52 -------------------------------------------------------------------------------- Multi-Disciplinary Care Plan Details Patient Name: Date of Service: [name], [name] 09/21/2022 2:00 PM Medical Record Number: [id number] Patient Account Number: [id number] Date of Birth/Sex: Treating RN: [name] (57 y.o. [name] Primary Care [name]: [name] Other Clinician: Referring [name] Goding: Treating Sumire Halbleib/Extender: [name] in Treatment: [place] reviewed with physician Active Inactive Nutrition Nursing Diagnoses: Impaired glucose control: actual or potential Potential for alteratiion in Nutrition/Potential for imbalanced nutrition Goals: Patient/caregiver will maintain therapeutic glucose control Date Initiated: 01/08/2022 Target Resolution Date: 10/02/2022 Goal Status: Active Interventions: Assess HgA1c results as ordered upon admission and as needed Assess patient nutrition upon admission and as needed per policy [place], Herbie [place] ([id number]) [id number] Page 4 of 7 Provide education on elevated blood sugars and impact on wound healing Treatment Activities: Dietary management education, guidance and counseling : 01/08/2022 Patient referred to Primary Care Physician for further nutritional evaluation : 01/08/2022 Notes: Pressure Nursing Diagnoses: Knowledge deficit related to causes and risk factors for pressure ulcer development Knowledge deficit related to management of pressures ulcers Potential for impaired tissue integrity related to pressure, friction, moisture, and shear Goals: Patient/caregiver will verbalize understanding of pressure ulcer management Date Initiated: 01/08/2022 Target Resolution Date: 10/02/2022 Goal Status: Active Interventions: Assess: immobility, friction, shearing, incontinence upon admission and as needed Assess offloading mechanisms upon admission and as  needed Assess potential for pressure ulcer upon admission and as needed Notes: Wound/Skin Impairment Nursing Diagnoses: Impaired tissue integrity Knowledge deficit related to ulceration/compromised skin integrity Goals: Patient/caregiver will verbalize understanding of skin care regimen Date Initiated: 01/08/2022 Target Resolution Date: 10/02/2022 Goal Status: Active Ulcer/skin breakdown will have a volume reduction of 30% by week 4 Date Initiated: 01/08/2022 Date Inactivated: 02/18/2022 Target Resolution Date: 02/05/2022 Goal Status: Unmet Unmet Reason: VAC leaking Ulcer/skin breakdown will have a volume reduction of 50% by week 8 Date Initiated: 02/18/2022 Date Inactivated: 03/04/2022 Target Resolution Date: 03/05/2022 Goal Status: Unmet Unmet Reason: infection Ulcer/skin breakdown will have a volume reduction of 80% by week 12 Date Initiated: 03/04/2022 Date Inactivated: 04/01/2022 Target Resolution Date: 04/02/2022 Goal Status: Unmet Unmet Reason: too much moisture Interventions: Assess patient/caregiver ability to obtain necessary supplies Assess patient/caregiver ability to perform ulcer/skin care regimen upon admission and as needed Assess ulceration(s) every visit Provide education on ulcer and skin care Treatment Activities: Skin care regimen initiated : 01/08/2022 Topical wound management initiated : 01/08/2022 Notes: Electronic Signature(s) Signed: 09/21/2022 4:59:31 PM By: Baruch Gouty RN, BSN Entered By: Baruch Gouty  on 09/21/2022 14:21:49 -------------------------------------------------------------------------------- Pain Assessment Details Patient Name: Date of Service: [name], [name] 09/21/2022 2:00 PM Medical Record Number: [id number] Patient Account Number: [id number] Date of Birth/Sex: Treating RN: [birth year] (57 y.o. Sheralyn Boatman, Herbie [place] ([id number]) [id number] Page 5 of 7 Primary Care Jeanclaude Wentworth: [name] Other  Clinician: Referring Rohith Fauth: Treating Darletta Noblett/Extender: [name] in Treatment: 36 Active Problems Location of Pain Severity and Description of Pain Patient Has Paino No Site Locations Rate the pain. Current Pain Level: 7 Worst Pain Level: 10 Least Pain Level: 0 Tolerable Pain Level: 3 Pain Management and Medication Current Pain Management: Electronic Signature(s) Signed: 09/21/2022 2:20:07 PM By: [name] Entered By: [name] on 09/21/2022 14:10:49 -------------------------------------------------------------------------------- Patient/Caregiver Education Details Patient Name: Date of Service: [name], [name] 1/15/2024andnbsp2:00 PM Medical Record Number: [id number] Patient Account Number: [id number] Date of Birth/Gender: Treating RN: [name] (57 y.o. [name] Primary Care Physician: [name] Other Clinician: Referring Physician: Treating Physician/Extender: [name] in Treatment: 36 Education Assessment Education Provided To: Patient Education Topics Provided Pressure: Methods: Explain/Verbal Responses: Reinforcements needed, State content correctly Wound/Skin Impairment: Methods: Explain/Verbal Responses: Reinforcements needed, State content correctly Electronic Signature(s) Signed: 09/21/2022 4:59:31 PM By: Baruch Gouty RN, BSN Entered By: Baruch Gouty on 09/21/2022 14:22:15 [name] ([id number]) [id number] Page 6 of 7 -------------------------------------------------------------------------------- Wound Assessment Details Patient Name: Date of Service: [name], [place] 09/21/2022 2:00 PM Medical Record Number: [id number] Patient Account Number: [id number] Date of Birth/Sex: Treating RN: [birth year] [name] (57 y.o. M) Primary Care Brecklynn Jian: [name] Other Clinician: Referring Tyjuan Demetro: Treating Curstin Schmale/Extender: [name] in Treatment: 60 Wound Status Wound Number: 1 Primary Pressure Ulcer Etiology: Wound Location: Sacrum Wound Open Wounding Event: Pressure Injury Status: Date Acquired: 07/29/2021 Comorbid Congestive Heart Failure, Hypertension, Type II Diabetes, Weeks Of Treatment: 36 History: Osteomyelitis, Confinement Anxiety Clustered Wound: No Photos Wound Measurements Length: (cm) 0.5 Width: (cm) 0.5 Depth: (cm) 0.6 Area: (cm) 0.196 Volume: (cm) 0.118 % Reduction in Area: 96.7% % Reduction in Volume: 98.2% Epithelialization: Small (1-33%) Tunneling: No Undermining: Yes Starting Position (o'clock): 12 Ending Position (o'clock): 6 Maximum Distance: (cm) 0.9 Wound Description Classification: Category/Stage IV Wound Margin: Epibole Exudate Amount: Medium Exudate Type: Serosanguineous Exudate Color: red, brown Foul Odor After Cleansing: No Slough/Fibrino No Wound Bed Granulation Amount: Large (67-100%) Exposed Structure Granulation Quality: Pink, Pale Fascia Exposed: No Necrotic Amount: Small (1-33%) Fat Layer (Subcutaneous Tissue) Exposed: Yes Necrotic Quality: Adherent Slough Tendon Exposed: No Muscle Exposed: No Joint Exposed: No Bone Exposed: No Periwound Skin Texture Texture Color No Abnormalities Noted: No No Abnormalities Noted: Yes Callus: Yes Temperature / Pain Excoriation: Yes Temperature: No Abnormality Rash: No Moisture No Abnormalities Noted: No Dry / Scaly: No Rutigliano, Herbie [place] ([id number]) [phone].pdf Page 7 of 7 Maceration: No Treatment Notes Wound #1 (Sacrum) Cleanser Soap and Water Discharge Instruction: [name] shower and wash wound with dial antibacterial soap and water prior to dressing change. Wound Cleanser Discharge Instruction: Cleanse the wound with wound cleanser prior to applying a clean dressing using gauze sponges, not tissue or cotton balls. Peri-Wound Care Skin Prep Discharge Instruction: Use skin  prep as directed Zinc Oxide Ointment 30g tube Discharge Instruction: Apply Zinc Oxide to macerated periwound with each dressing change as needed Topical Primary Dressing Sorbalgon AG Dressing 2x2 (in/in) Discharge Instruction: Apply to wound bed as instructed Secondary Dressing Zetuvit Plus Silicone Border Sacrum Dressing, Sm, 7x7 (in/in) Discharge Instruction: Apply silicone border over primary dressing as directed. Secured With  21M Medipore H Soft Cloth Surgical T ape, 4 x 10 (in/yd) Discharge Instruction: Secure with tape as directed. Compression Wrap Compression Stockings Add-Ons Gloves, Art gallery manager) Signed: 09/21/2022 4:59:31 PM By: Baruch Gouty RN, BSN Entered By: Baruch Gouty on 09/21/2022 14:19:48 -------------------------------------------------------------------------------- Vitals Details Patient Name: Date of Service: [name], [place] 09/21/2022 2:00 PM Medical Record Number: [id number] Patient Account Number: [id number] Date of Birth/Sex: Treating RN: [birth year] (57 y.o. M) Primary Care Sahil Milner: [name] Other Clinician: Referring Kweku Stankey: Treating Leandro Berkowitz/Extender: [name] in Treatment: 36 Vital Signs Time Taken: 02:10 Temperature (F): 97.6 Height (in): 75 Pulse (bpm): 92 Weight (lbs): 220 Respiratory Rate (breaths/min): 18 Body Mass Index (BMI): 27.5 Capillary Blood Glucose (mg/dl): 125 Reference Range: 80 - 120 mg / dl Electronic Signature(s) Signed: 09/21/2022 4:59:31 PM By: Baruch Gouty RN, BSN Entered By: Baruch Gouty on 09/21/2022 14:17:26

## 2022-09-23 DIAGNOSIS — Z89512 Acquired absence of left leg below knee: Secondary | ICD-10-CM | POA: Diagnosis not present

## 2022-09-23 DIAGNOSIS — Z89511 Acquired absence of right leg below knee: Secondary | ICD-10-CM | POA: Diagnosis not present

## 2022-09-24 ENCOUNTER — Encounter: Payer: Medicare HMO | Admitting: Physical Therapy

## 2022-09-28 ENCOUNTER — Encounter: Payer: Medicare HMO | Admitting: Physical Therapy

## 2022-10-01 ENCOUNTER — Encounter: Payer: Medicare HMO | Admitting: Physical Therapy

## 2022-10-05 ENCOUNTER — Encounter (HOSPITAL_BASED_OUTPATIENT_CLINIC_OR_DEPARTMENT_OTHER): Payer: Medicare HMO | Admitting: General Surgery

## 2022-10-05 DIAGNOSIS — E119 Type 2 diabetes mellitus without complications: Secondary | ICD-10-CM | POA: Diagnosis not present

## 2022-10-05 DIAGNOSIS — I110 Hypertensive heart disease with heart failure: Secondary | ICD-10-CM | POA: Diagnosis not present

## 2022-10-05 DIAGNOSIS — Z89512 Acquired absence of left leg below knee: Secondary | ICD-10-CM | POA: Diagnosis not present

## 2022-10-05 DIAGNOSIS — M4628 Osteomyelitis of vertebra, sacral and sacrococcygeal region: Secondary | ICD-10-CM | POA: Diagnosis not present

## 2022-10-05 DIAGNOSIS — M5136 Other intervertebral disc degeneration, lumbar region: Secondary | ICD-10-CM | POA: Diagnosis not present

## 2022-10-05 DIAGNOSIS — F419 Anxiety disorder, unspecified: Secondary | ICD-10-CM | POA: Diagnosis not present

## 2022-10-05 DIAGNOSIS — G8929 Other chronic pain: Secondary | ICD-10-CM | POA: Diagnosis not present

## 2022-10-05 DIAGNOSIS — Z89511 Acquired absence of right leg below knee: Secondary | ICD-10-CM | POA: Diagnosis not present

## 2022-10-05 DIAGNOSIS — I509 Heart failure, unspecified: Secondary | ICD-10-CM | POA: Diagnosis not present

## 2022-10-05 DIAGNOSIS — L89154 Pressure ulcer of sacral region, stage 4: Secondary | ICD-10-CM | POA: Diagnosis not present

## 2022-10-05 NOTE — Progress Notes (Signed)
[name] ([id number]) [id number] Page 1 of 8 Visit Report for 10/05/2022 Arrival Information Details Patient Name: Date of Service: [name], [name] 10/05/2022 1:15 PM Medical Record Number: [id number] Patient Account Number: [id number] Date of Birth/Sex: Treating RN: 06/22/1966 (57 y.o. [name] Primary Care Carah Barrientes: Berniece Pap Other Clinician: Referring Shakirah Kirkey: Treating Ruble Pumphrey/Extender: Lannie Fields in Treatment: 38 Visit Information History Since Last Visit Added or deleted any medications: No Patient Arrived: [name] Any new allergies or adverse reactions: No Arrival Time: 13:18 Had a fall or experienced change in No Accompanied By: friend activities of daily living that may affect Transfer Assistance: None risk of falls: Patient Identification Verified: Yes Signs or symptoms of abuse/neglect since last visito No Secondary Verification Process Completed: Yes Hospitalized since last visit: No Patient Requires Transmission-Based Precautions: No Implantable device outside of the clinic excluding No Patient Has Alerts: No cellular tissue based products placed in the center since last visit: Has Dressing in Place as Prescribed: Yes Pain Present Now: Yes Electronic Signature(s) Signed: 10/05/2022 5:57:45 PM By: Baruch Gouty RN, BSN Entered By: Baruch Gouty on 10/05/2022 13:24:43 -------------------------------------------------------------------------------- Encounter Discharge Information Details Patient Name: Date of Service: [name], [name] 10/05/2022 1:15 PM Medical Record Number: [id number] Patient Account Number: [id number] Date of Birth/Sex: Treating RN: [birth year] (57 y.o. [name] Primary Care Hallee Mckenny: Berniece Pap Other Clinician: Referring Keyah Blizard: Treating Edahi Kroening/Extender: Lannie Fields in Treatment: 38 Encounter Discharge Information Items  Post Procedure Vitals Discharge Condition: Stable Temperature (F): 97.7 Ambulatory Status: Walker Pulse (bpm): 102 Discharge Destination: Home Respiratory Rate (breaths/min): 18 Transportation: Private Auto Blood Pressure (mmHg): 154/100 Accompanied By: friend Schedule Follow-up Appointment: Yes Clinical Summary of Care: Patient Declined Electronic Signature(s) Signed: 10/05/2022 5:57:45 PM By: Baruch Gouty RN, BSN Entered By: Baruch Gouty on 10/05/2022 14:02:04 [name] ([id number]) [id number] Page 2 of 8 -------------------------------------------------------------------------------- Lower Extremity Assessment Details Patient Name: Date of Service: [name] 10/05/2022 1:15 PM Medical Record Number: [id number] Patient Account Number: [id number] Date of Birth/Sex: Treating RN: 08/12/1966 (57 y.o. [name] Primary Care Terran Klinke: Berniece Pap Other Clinician: Referring Analayah Brooke: Treating Arrington Yohe/Extender: Lannie Fields in Treatment: 30 Electronic Signature(s) Signed: 10/05/2022 5:57:45 PM By: Baruch Gouty RN, BSN Entered By: Baruch Gouty on 10/05/2022 13:26:26 -------------------------------------------------------------------------------- Multi Wound Chart Details Patient Name: Date of Service: [name], [name] 10/05/2022 1:15 PM Medical Record Number: [id number] Patient Account Number: [id number] Date of Birth/Sex: Treating RN: 1965/09/17 (57 y.o. M) Primary Care Lorriane Dehart: Berniece Pap Other Clinician: Referring Diego Delancey: Treating Kaslyn Richburg/Extender: Lannie Fields in Treatment: 38 Vital Signs Height(in): 75 Capillary Blood Glucose(mg/dl): 135 Weight(lbs): 220 Pulse(bpm): 102 Body Mass Index(BMI): 27.5 Blood Pressure(mmHg): 154/100 Temperature(F): 97.7 Respiratory Rate(breaths/min): 18 [1:Photos:] [N/A:N/A] Sacrum N/A N/A Wound Location: Pressure Injury  N/A N/A Wounding Event: Pressure Ulcer N/A N/A Primary Etiology: Congestive Heart Failure, N/A N/A Comorbid History: Hypertension, Type II Diabetes, Osteomyelitis, Confinement Anxiety 07/29/2021 N/A N/A Date Acquired: 27 N/A N/A Weeks of Treatment: Open N/A N/A Wound Status: No N/A N/A Wound Recurrence: 0.8x0.5x0.9 N/A N/A Measurements L x W x D (cm) 0.314 N/A N/A A (cm) : rea 0.283 N/A N/A Volume (cm) : 94.70% N/A N/A % Reduction in A rea: 95.60% N/A N/A % Reduction in Volume: 12 Starting Position 1 (o'clock): 6 Ending Position 1 (o'clock): 0.5 Maximum Distance 1 (cm): Yes N/A N/A Undermining: Category/Stage IV N/A N/A Classification: Medium N/A N/A Exudate A mount: Serosanguineous N/A N/A Exudate Type:  [name] ([id number]) [id number] Page 3 of 8 red, brown N/A N/A Exudate Color: Epibole N/A N/A Wound Margin: Large (67-100%) N/A N/A Granulation Amount: Pink, Pale N/A N/A Granulation Quality: Small (1-33%) N/A N/A Necrotic Amount: Fat Layer (Subcutaneous Tissue): Yes N/A N/A Exposed Structures: Fascia: No Tendon: No Muscle: No Joint: No Bone: No Small (1-33%) N/A N/A Epithelialization: Debridement - Selective/Open Wound N/A N/A Debridement: Pre-procedure Verification/Time Out 13:40 N/A N/A Taken: Lidocaine 4% Topical Solution N/A N/A Pain Control: Skin/Epidermis N/A N/A Level: 0.4 N/A N/A Debridement A (sq cm): rea Curette N/A N/A Instrument: Minimum N/A N/A Bleeding: Pressure N/A N/A Hemostasis A chieved: 0 N/A N/A Procedural Pain: 0 N/A N/A Post Procedural Pain: Procedure was tolerated well N/A N/A Debridement Treatment Response: 0.8x0.5x0.9 N/A N/A Post Debridement Measurements L x W x D (cm) 0.283 N/A N/A Post Debridement Volume: (cm) Category/Stage IV N/A N/A Post Debridement Stage: Excoriation: No N/A N/A Periwound Skin Texture: Callus: No Rash: No Maceration: No N/A  N/A Periwound Skin Moisture: Dry/Scaly: No No Abnormalities Noted N/A N/A Periwound Skin Color: No Abnormality N/A N/A Temperature: Debridement N/A N/A Procedures Performed: Treatment Notes Electronic Signature(s) Signed: 10/05/2022 1:57:10 PM By: [name] MD FACS Entered By: [name] on 10/05/2022 13:57:10 -------------------------------------------------------------------------------- Multi-Disciplinary Care Plan Details Patient Name: Date of Service: [name], [name] 10/05/2022 1:15 PM Medical Record Number: [id number] Patient Account Number: [id number] Date of Birth/Sex: Treating RN: 25-Oct-1965 (57 y.o. [name] Primary Care Kabria Hetzer: Berniece Pap Other Clinician: Referring Patrich Heinze: Treating Robertta Halfhill/Extender: Lannie Fields in Treatment: [place] reviewed with physician Active Inactive Nutrition Nursing Diagnoses: Impaired glucose control: actual or potential Potential for alteratiion in Nutrition/Potential for imbalanced nutrition Goals: Patient/caregiver will maintain therapeutic glucose control Date Initiated: 01/08/2022 Target Resolution Date: 11/02/2022 Goal Status: Active Interventions: Assess HgA1c results as ordered upon admission and as needed ZYIERE, ROSEMOND ([id number]) [id number] Page 4 of 8 Assess patient nutrition upon admission and as needed per policy Provide education on elevated blood sugars and impact on wound healing Treatment Activities: Dietary management education, guidance and counseling : 01/08/2022 Patient referred to Primary Care Physician for further nutritional evaluation : 01/08/2022 Notes: Pressure Nursing Diagnoses: Knowledge deficit related to causes and risk factors for pressure ulcer development Knowledge deficit related to management of pressures ulcers Potential for impaired tissue integrity related to pressure, friction, moisture, and  shear Goals: Patient/caregiver will verbalize understanding of pressure ulcer management Date Initiated: 01/08/2022 Target Resolution Date: 11/02/2022 Goal Status: Active Interventions: Assess: immobility, friction, shearing, incontinence upon admission and as needed Assess offloading mechanisms upon admission and as needed Assess potential for pressure ulcer upon admission and as needed Notes: Wound/Skin Impairment Nursing Diagnoses: Impaired tissue integrity Knowledge deficit related to ulceration/compromised skin integrity Goals: Patient/caregiver will verbalize understanding of skin care regimen Date Initiated: 01/08/2022 Target Resolution Date: 11/02/2022 Goal Status: Active Ulcer/skin breakdown will have a volume reduction of 30% by week 4 Date Initiated: 01/08/2022 Date Inactivated: 02/18/2022 Target Resolution Date: 02/05/2022 Goal Status: Unmet Unmet Reason: VAC leaking Ulcer/skin breakdown will have a volume reduction of 50% by week 8 Date Initiated: 02/18/2022 Date Inactivated: 03/04/2022 Target Resolution Date: 03/05/2022 Goal Status: Unmet Unmet Reason: infection Ulcer/skin breakdown will have a volume reduction of 80% by week 12 Date Initiated: 03/04/2022 Date Inactivated: 04/01/2022 Target Resolution Date: 04/02/2022 Goal Status: Unmet Unmet Reason: too much moisture Interventions: Assess patient/caregiver ability to obtain necessary supplies Assess patient/caregiver ability to perform ulcer/skin care regimen upon admission and as needed  Assess ulceration(s) every visit Provide education on ulcer and skin care Treatment Activities: Skin care regimen initiated : 01/08/2022 Topical wound management initiated : 01/08/2022 Notes: Electronic Signature(s) Signed: 10/05/2022 5:57:45 PM By: Baruch Gouty RN, BSN Entered By: Baruch Gouty on 10/05/2022 13:37:30 -------------------------------------------------------------------------------- Pain Assessment Details Patient  Name: Date of Service: [name], [name] 10/05/2022 1:15 PM Medical Record Number: [id number] Patient Account Number: [id number] [name], [name] ([id number]) [id number] Page 5 of 8 Date of Birth/Sex: Treating RN: Mar 16, 1966 (57 y.o. [name] Primary Care Amonda Brillhart: Other Clinician: Berniece Pap Referring Drisana Schweickert: Treating Darlene Bartelt/Extender: Lannie Fields in Treatment: 38 Active Problems Location of Pain Severity and Description of Pain Patient Has Paino Yes Site Locations Pain Location: Generalized Pain With Dressing Change: No Duration of the Pain. Constant / Intermittento Constant Rate the pain. Current Pain Level: 9 Character of Pain Describe the Pain: Aching Pain Management and Medication Current Pain Management: Medication: Yes How does your wound impact your activities of daily livingo Sleep: Yes Bathing: No Appetite: No Relationship With Others: No Bladder Continence: No Emotions: Yes Bowel Continence: No Hobbies: No Toileting: No Dressing: No Notes reports chronic back and hip pain Electronic Signature(s) Signed: 10/05/2022 5:57:45 PM By: Baruch Gouty RN, BSN Entered By: Baruch Gouty on 10/05/2022 13:26:20 -------------------------------------------------------------------------------- Patient/Caregiver Education Details Patient Name: Date of Service: [name], [name] 1/29/2024andnbsp1:15 PM Medical Record Number: [id number] Patient Account Number: [id number] Date of Birth/Gender: Treating RN: 1966/04/07 (57 y.o. [name] Primary Care Physician: Berniece Pap Other Clinician: Referring Physician: Treating Physician/Extender: Lannie Fields in Treatment: 31 Education Assessment Education Provided To: Patient [name], [name] ([id number]) [id number] Page 6 of 8 Education Topics Provided Elevated Blood Sugar/ Impact on  Healing: Methods: Explain/Verbal Responses: Reinforcements needed, State content correctly Pressure: Methods: Explain/Verbal Responses: Reinforcements needed, State content correctly Electronic Signature(s) Signed: 10/05/2022 5:57:45 PM By: Baruch Gouty RN, BSN Entered By: Baruch Gouty on 10/05/2022 13:37:51 -------------------------------------------------------------------------------- Wound Assessment Details Patient Name: Date of Service: [name], [name] 10/05/2022 1:15 PM Medical Record Number: [id number] Patient Account Number: [id number] Date of Birth/Sex: Treating RN: 24-Oct-1965 (57 y.o. [name] Primary Care Shalisa Mcquade: Berniece Pap Other Clinician: Referring Wyoma Genson: Treating Smera Guyette/Extender: Lannie Fields in Treatment: 38 Wound Status Wound Number: 1 Primary Pressure Ulcer Etiology: Wound Location: Sacrum Wound Open Wounding Event: Pressure Injury Status: Date Acquired: 07/29/2021 Comorbid Congestive Heart Failure, Hypertension, Type II Diabetes, Weeks Of Treatment: 38 History: Osteomyelitis, Confinement Anxiety Clustered Wound: No Photos Wound Measurements Length: (cm) 0.8 Width: (cm) 0.5 Depth: (cm) 0.9 Area: (cm) 0.314 Volume: (cm) 0.283 % Reduction in Area: 94.7% % Reduction in Volume: 95.6% Epithelialization: Small (1-33%) Tunneling: No Undermining: Yes Starting Position (o'clock): 12 Ending Position (o'clock): 6 Maximum Distance: (cm) 0.5 Wound Description Classification: Category/Stage IV Wound Margin: Epibole Exudate Amount: Medium Exudate Type: Serosanguineous Exudate Color: red, brown Foul Odor After Cleansing: No Slough/Fibrino No Wound Bed Granulation Amount: Large (67-100%) Exposed [name], [name] ([id number]) [id number] Page 7 of 8 Granulation Quality: Pink, Pale Fascia Exposed: No Necrotic Amount: Small (1-33%) Fat Layer (Subcutaneous Tissue) Exposed:  Yes Necrotic Quality: Adherent Slough Tendon Exposed: No Muscle Exposed: No Joint Exposed: No Bone Exposed: No Periwound Skin Texture Texture Color No Abnormalities Noted: Yes No Abnormalities Noted: Yes Moisture Temperature / Pain No Abnormalities Noted: No Temperature: No Abnormality Dry / Scaly: No Maceration: No Treatment Notes Wound #1 (Sacrum) Cleanser Soap and Water Discharge Instruction: May shower and wash wound with dial antibacterial soap  and water prior to dressing change. Wound Cleanser Discharge Instruction: Cleanse the wound with wound cleanser prior to applying a clean dressing using gauze sponges, not tissue or cotton balls. Peri-Wound Care Skin Prep Discharge Instruction: Use skin prep as directed Zinc Oxide Ointment 30g tube Discharge Instruction: Apply Zinc Oxide to macerated periwound with each dressing change as needed Topical Primary Dressing Promogran Prisma Matrix, 4.34 (sq in) (silver collagen) Discharge Instruction: Moisten collagen with saline and place in base or wound Secondary Dressing Woven Gauze Sponges 2x2 in Discharge Instruction: Moisten with saline and pack into wound lightly to hold collagen in place Zetuvit Plus Silicone Border Sacrum Dressing, Sm, 7x7 (in/in) Discharge Instruction: Apply silicone border over primary dressing as directed. Secured With 54M Medipore H Soft Cloth Surgical T ape, 4 x 10 (in/yd) Discharge Instruction: Secure with tape as directed. Compression Wrap Compression Stockings Add-Ons Gloves, Art gallery manager) Signed: 10/05/2022 5:57:45 PM By: Baruch Gouty RN, BSN Entered By: Baruch Gouty on 10/05/2022 13:34:54 -------------------------------------------------------------------------------- Vitals Details Patient Name: Date of Service: [name], [name] 10/05/2022 1:15 PM Medical Record Number: [id number] Patient Account Number: [id number] Date of Birth/Sex: Treating RN: 1966/01/16 (57 y.o. [name] Primary Care Dylan Ruotolo: Berniece Pap Other Clinician: Referring Sahib Pella: Treating Alesandro Stueve/Extender: Lannie Fields in Treatment: [address], [place] ([id number]) [id number] Page 8 of 8 Vital Signs Time Taken: 13:24 Temperature (F): 97.7 Height (in): 75 Pulse (bpm): 102 Weight (lbs): 220 Respiratory Rate (breaths/min): 18 Body Mass Index (BMI): 27.5 Blood Pressure (mmHg): 154/100 Capillary Blood Glucose (mg/dl): 135 Reference Range: 80 - 120 mg / dl Notes glucose per pt report this am Electronic Signature(s) Signed: 10/05/2022 5:57:45 PM By: Baruch Gouty RN, BSN Entered By: Baruch Gouty on 10/05/2022 13:25:14

## 2022-10-05 NOTE — Progress Notes (Signed)
TOAN, MORT ([id number]) [id number] Page 1 of 10 Visit Report for 10/05/2022 Chief Complaint Document Details Patient Name: Date of Service: [name], [name] 10/05/2022 1:15 PM Medical Record Number: [id number] Patient Account Number: [id number] Date of Birth/Sex: Treating RN: 02/25/1966 (57 y.o. M) Primary Care Provider: Berniece Pap Other Clinician: Referring Provider: Treating Provider/Extender: Lannie Fields in Treatment: 38 Information Obtained from: Patient Chief Complaint Patient is at the clinic for treatment of an open pressure ulcer Electronic Signature(s) Signed: 10/05/2022 1:57:17 PM By: [name] MD FACS Entered By: [name] on 10/05/2022 13:57:16 -------------------------------------------------------------------------------- Debridement Details Patient Name: Date of Service: [name], [name] 10/05/2022 1:15 PM Medical Record Number: [id number] Patient Account Number: [id number] Date of Birth/Sex: Treating RN: 08-05-1966 (57 y.o. [name] Primary Care Provider: Berniece Pap Other Clinician: Referring Provider: Treating Provider/Extender: Lannie Fields in Treatment: 38 Debridement Performed for Assessment: Wound #1 Sacrum Performed By: Physician [name], MD Debridement Type: Debridement Level of Consciousness (Pre-procedure): Awake and Alert Pre-procedure Verification/Time Out Yes - 13:40 Taken: Start Time: 13:41 Pain Control: Lidocaine 4% T opical Solution T Area Debrided (L x W): otal 0.8 (cm) x 0.5 (cm) = 0.4 (cm) Tissue and other material debrided: Non-Viable, Skin: Epidermis Level: Skin/Epidermis Debridement Description: Selective/Open Wound Instrument: Curette Bleeding: Minimum Hemostasis Achieved: Pressure Procedural Pain: 0 Post Procedural Pain: 0 Response to Treatment: Procedure was tolerated well Level of Consciousness (Post- Awake  and Alert procedure): Post Debridement Measurements of Total Wound Length: (cm) 0.8 Stage: Category/Stage IV Width: (cm) 0.5 Depth: (cm) 0.9 Volume: (cm) 0.283 Character of Wound/Ulcer Post Debridement: Improved Post Procedure Diagnosis Same as Robbie Lis ([id number]) [id number] Page 2 of 10 Notes Scribed for Dr. Celine Ahr by Baruch Gouty RN Electronic Signature(s) Signed: 10/05/2022 4:07:33 PM By: [name] MD FACS Signed: 10/05/2022 5:57:45 PM By: Baruch Gouty RN, BSN Entered By: Baruch Gouty on 10/05/2022 13:43:25 -------------------------------------------------------------------------------- HPI Details Patient Name: Date of Service: [name], [name] 10/05/2022 1:15 PM Medical Record Number: [id number] Patient Account Number: [id number] Date of Birth/Sex: Treating RN: 1966/06/14 (57 y.o. M) Primary Care Provider: Berniece Pap Other Clinician: Referring Provider: Treating Provider/Extender: Lannie Fields in Treatment: 38 History of Present Illness HPI Description: ADMISSION 01/08/2022 This is a 57 year old male with a past medical history notable for type 2 diabetes mellitus (last A1c was 8.6) congestive heart failure, hypertension, chronic pain, and bilateral below-knee amputations. His most recent amputation was in November 2022. While he was in the hospital, he developed a sacral pressure ulcer. He was subsequently in a skilled nursing facility for some time. He was discharged with home health and had been in a wound VAC, but was then admitted to the hospital last week when the wound appeared to be worsening. Apparently the periwound skin was macerated and the device that he had been using was leaking quite a bit. Evaluation while in the hospital included a consultation with infectious disease who did not think he had any evidence of osteomyelitis, plastic surgery who felt that he was not an  appropriate flap candidate (their note also states that he is not interested in a flap) and wound care who took him out of the wound VAC and initiated wet-to-dry dressing changes. He has a new wound VAC from [facility] on order, anticipated delivery today. The wound itself is fairly small and isolated to the sacrum. There is muscle exposed. No bone is appreciated but it is palpable beneath the surface. The muscle  itself is bit pale and there is heaped up epibole around the wound edges. No significant odor or drainage. 01/14/2022: His wound VAC was not initiated until this past Friday. He has not had any issues with the VAC but today we found that the bridge foam was applied directly to the skin rather than over a layer of adhesive drape. His home health nurse also requested that we consider applying silver collagen to the wound bed in addition to the VAC. There is still a little bit of heaped up senescent skin around the wound periphery. No significant change to the wound dimensions. 01/21/2022: No significant change to the wound dimensions. The senescent skin has not reaccumulated. The periwound skin remains a bit macerated but without any obvious breakdown. The wound surface itself has a shiny appearance with a little bit of slough accumulation; no true robust granulation tissue at this time. 01/28/2022: The wound is about the same size but a little bit shallower. There is a little bit of senescent skin reaccumulation at the cranial aspect. The periwound skin is red but not macerated and without any tissue breakdown. The wound still does not have the most robust surface. There is a bit of slough accumulation. 02/04/2022: The wound is a bit smaller and the undermining has come in somewhat. There continues to be granulation tissue formation within the wound bed. No significant slough accumulation and his periwound skin is in better condition. 02/18/2022: The wound stinks today. There is no obvious pus but the  drainage and wound itself are malodorous. He continues to have heaped up tissue within the wound bed that is rather grayish and not particularly robust-appearing. He is very angry today with his situation. 02/25/2022: Last week, in response to the odor coming from the wound, I took a culture and prescribed topical gentamicin as well as oral cefdinir. Apparently [facility] contacted him about a topical compounded antibiotic, but he did not realize this and he hung up on them. Today, the odor is no longer present. There is some senescent skin around the wound margins as well as continued heaped up granulation tissue in the center of the wound. The undermining continues to contract. 03/04/2022: The wound continues to contract and look better. He still has heaped up hypertrophic granulation tissue near the orifice. No odor was coming from the wound today. He is awaiting [place] delivery. 03/18/2022: 2-week follow-up. [facility] topical antibiotic has been initiated. The chemical cauterization of the hypertrophic granulation tissue was quite successful and the surface is much flatter today. He has heaped up senescent skin around the perimeter. His home health providers have figured out a way to keep the wound VAC from losing suction by bolstering with DuoDERM. Overall there has been significant improvement since our last visit. 04/01/2022: 2-week follow-up. The wound continues to contract. Once again, there is heaped up senescent skin around the perimeter. He has a little bit of skin breakdown in the distribution of the adhesive drape from the wound VAC. No odor or purulent drainage. No concern for infection. 04/15/2022: 2-week follow-up. He has developed a fairly substantial rash from the drape adhesive for his wound VAC. The periwound has a lot of heaped up epiboly at the margins. The tissue in the wound bed is a little bit pale but there is no odor or purulent drainage. 04/22/2022: Last week we discontinued the  VAC. Today, the skin around his wound is in significantly improved condition. His rash is resolving. He does have some heaped up senescent skin  around the wound margin and a layer of slough on the wound surface, but there is no concern for active infection. 04/29/2022: The wound continues to contract. The surface is clean. He continues to build up senescent skin around the wound edges that subsequently get a little bit macerated. No concern for infection. 05/06/2022: The wound is smaller again today in all dimensions. The surface is clean. He has his usual accumulation of macerated senescent skin around the [place], Herbie [place] ([id number]) [id number] Page 3 of 10 wound edges. 05/13/2022: Continued wound contracture. The undermining has decreased quite a bit. Clean surface. Senescent skin heaped up around the wound edges, as per usual. 05/22/2022: The macerated senescent skin has accumulated once again. The wound dimensions are slightly smaller and the surface is clean. He has been having difficulty keeping the packing in the wound due to the large foam border dressing that he prefers. 06/03/2022: The wound is smaller and shallower with less undermining. He has built up macerated senescent skin around the margins, as per usual. He and his friend have figured out a way to make sure that his packing stays in the wound with his [place]. 06/12/2022: The wound is a little bit shallower again today. He still has accumulated senescent periwound skin, but otherwise the wound is clean. He will be undergoing bilateral recurrent inguinal hernia repair and umbilical hernia repair next week. 10/20 sacral pressure ulcer. using [facility] and backing wet to dry. Has developed a rash sur rounding the wound 07/03/2022: Continued contracture of the wound. It is very clean and there is no odor or purulent drainage. Continued buildup of senescent skin around the margin. 07/13/2022: There is less  undermining present today. The orifice continues to contract. He continues to accumulate senescent skin around the perimeter. The rash around the wound has improved. 07/27/2022: The wound is smaller again today and the undermining is closed then. There is senescent skin accumulation, as usual. 08/07/2022: The wound continues to contract but has its usual accumulation of periwound senescent skin. Unfortunately, the periwound skin looks like it has gotten more moist. It is red and angry and the patient says it is more painful. 08/14/2022: The wound is smaller again this week. His periwound skin is in much better condition. There is still senescent skin around the wound margins and a bit of slough on the surface. 12/18; this is a lower sacral pressure ulcer. We have been using silver alginate for the last 2 weeks. This is longstanding and refractory. At 1 point I think had exposed bone he no longer has this now. He tells me that he has had problems with his right lower leg stump and he is going to see hangers she is therefore not walking but using his wheelchair although he says he is in his wheelchair "only 10% of the time]. Otherwise he claims to be rigorous with offloading this. 09/08/2022: The wound is considerably smaller than the last time I saw it. He has, as usual, the senescent skin accumulation around the wound margin. The undermining and depth, as well as the overall dimensions have come in significantly. 09/21/2022: The wound dimensions are roughly the same, no significant accumulation of periwound senescent skin. There is some reaccumulation of hypertrophic granulation tissue on the wound bed, however. He is spending a bit more time in his wheelchair due to issues with his prosthetics, but he is hopeful that this will be resolved within the next week or so. 10/05/2022: The undermined portion of his wound has come in  quite a bit. They are having difficulty getting the silver alginate to stay in  place due to the decrease in wound size. He has accumulated senescent skin around the wound margins. Electronic Signature(s) Signed: 10/05/2022 1:58:09 PM By: [name] MD FACS Entered By: [name] on 10/05/2022 13:58:09 -------------------------------------------------------------------------------- Physical Exam Details Patient Name: Date of Service: [name], [name] 10/05/2022 1:15 PM Medical Record Number: [id number] Patient Account Number: [id number] Date of Birth/Sex: Treating RN: [birth year] (57 y.o. M) Primary Care Provider: Berniece Pap Other Clinician: Referring Provider: Treating Provider/Extender: Lannie Fields in Treatment: 38 Constitutional Hypertensive, asymptomatic. Slightly tachycardic. . . no acute distress. Respiratory Normal work of breathing on room air. Notes 10/05/2022: The undermined portion of his wound has come in quite a bit. They are having difficulty getting the silver alginate to stay in place due to the decrease in wound size. He has accumulated senescent skin around the wound margins. Electronic Signature(s) Signed: 10/05/2022 1:58:38 PM By: [name] MD FACS Entered By: [name] on 10/05/2022 13:58:38 [name] ([id number]) [id number] Page 4 of 10 -------------------------------------------------------------------------------- Physician Orders Details Patient Name: Date of Service: [name] 10/05/2022 1:15 PM Medical Record Number: [id number] Patient Account Number: [id number] Date of Birth/Sex: Treating RN: 11-Feb-1966 (57 y.o. [name] Primary Care Provider: Berniece Pap Other Clinician: Referring Provider: Treating Provider/Extender: Lannie Fields in Treatment: 80 Verbal / Phone Orders: No Diagnosis Coding ICD-10 Coding Code Description L89.154 Pressure ulcer of sacral region, stage 4 Z89.512 Acquired absence of  left leg below knee Z89.511 Acquired absence of right leg below knee Follow-up Appointments ppointment in 2 weeks. - Dr. Celine Ahr - room 1 Return A Monday 2/12 @ 1:15 pm Anesthetic Wound #1 Sacrum (In clinic) Topical Lidocaine 4% applied to wound bed Off-Loading Gel mattress overlay (Group 1) Turn and reposition every 2 hours Wound Treatment Wound #1 - Sacrum Cleanser: Soap and Water 3 x Per Week/30 Days Discharge Instructions: May shower and wash wound with dial antibacterial soap and water prior to dressing change. Cleanser: Wound Cleanser 3 x Per Week/30 Days Discharge Instructions: Cleanse the wound with wound cleanser prior to applying a clean dressing using gauze sponges, not tissue or cotton balls. Peri-Wound Care: Skin Prep 3 x Per Week/30 Days Discharge Instructions: Use skin prep as directed Peri-Wound Care: Zinc Oxide Ointment 30g tube 3 x Per Week/30 Days Discharge Instructions: Apply Zinc Oxide to macerated periwound with each dressing change as needed Prim Dressing: Promogran Prisma Matrix, 4.34 (sq in) (silver collagen) 3 x Per Week/30 Days ary Discharge Instructions: Moisten collagen with saline and place in base or wound Secondary Dressing: Woven Gauze Sponges 2x2 in 3 x Per Week/30 Days Discharge Instructions: Moisten with saline and pack into wound lightly to hold collagen in place Secondary Dressing: Zetuvit Plus Silicone Border Sacrum Dressing, Sm, 7x7 (in/in) (Generic) 3 x Per Week/30 Days Discharge Instructions: Apply silicone border over primary dressing as directed. Secured With: 53M Medipore H Soft Cloth Surgical T ape, 4 x 10 (in/yd) (Generic) 3 x Per Week/30 Days Discharge Instructions: Secure with tape as directed. Add-Ons: Gloves, Large 3 x Per Week/30 Days Electronic Signature(s) Signed: 10/05/2022 4:07:33 PM By: [name] MD FACS Entered By: [name] on 10/05/2022 13:58:48 [name] ([id number]) [id number]  Page 5 of 10 -------------------------------------------------------------------------------- Problem List Details Patient Name: Date of Service: [name] 10/05/2022 1:15 PM Medical Record Number: [id number] Patient Account Number: [id number] Date of Birth/Sex: Treating RN: Nov 15, 1965 (  57 y.o. [name] Primary Care Provider: Berniece Pap Other Clinician: Referring Provider: Treating Provider/Extender: Lannie Fields in Treatment: 38 Active Problems ICD-10 Encounter Code Description Active Date MDM Diagnosis L89.154 Pressure ulcer of sacral region, stage 4 01/08/2022 No Yes Z89.512 Acquired absence of left leg below knee 01/08/2022 No Yes Z89.511 Acquired absence of right leg below knee 01/08/2022 No Yes Inactive Problems Resolved Problems Electronic Signature(s) Signed: 10/05/2022 1:57:03 PM By: [name] MD FACS Entered By: [name] on 10/05/2022 13:57:03 -------------------------------------------------------------------------------- Progress Note Details Patient Name: Date of Service: [name], [name] 10/05/2022 1:15 PM Medical Record Number: [id number] Patient Account Number: [id number] Date of Birth/Sex: Treating RN: 1965-10-06 (57 y.o. M) Primary Care Provider: Berniece Pap Other Clinician: Referring Provider: Treating Provider/Extender: Lannie Fields in Treatment: 38 Subjective Chief Complaint Information obtained from Patient Patient is at the clinic for treatment of an open pressure ulcer History of Present Illness (HPI) ADMISSION 01/08/2022 This is a 57 year old male with a past medical history notable for type 2 diabetes mellitus (last A1c was 8.6) congestive heart failure, hypertension, chronic pain, and bilateral below-knee amputations. His most recent amputation was in November 2022. While he was in the hospital, he developed a sacral pressure ulcer. He was subsequently in a skilled  nursing facility for some time. He was discharged with home health and had been in a wound VAC, but was then admitted to the hospital last week when the wound appeared to be worsening. Apparently the periwound skin was macerated and the device that he had been using was leaking quite a bit. Evaluation while in the hospital included a consultation with infectious disease who did not think he had any evidence of osteomyelitis, plastic surgery who felt that he was not an appropriate flap candidate (their note also states that he is not interested in a flap) and wound care who took him out of the wound VAC and initiated wet-to-dry dressing changes. He has a new wound VAC from [facility] on order, anticipated delivery today. LUE, [place] ([id number]) [id number] Page 6 of 10 The wound itself is fairly small and isolated to the sacrum. There is muscle exposed. No bone is appreciated but it is palpable beneath the surface. The muscle itself is bit pale and there is heaped up epibole around the wound edges. No significant odor or drainage. 01/14/2022: His wound VAC was not initiated until this past Friday. He has not had any issues with the VAC but today we found that the bridge foam was applied directly to the skin rather than over a layer of adhesive drape. His home health nurse also requested that we consider applying silver collagen to the wound bed in addition to the VAC. There is still a little bit of heaped up senescent skin around the wound periphery. No significant change to the wound dimensions. 01/21/2022: No significant change to the wound dimensions. The senescent skin has not reaccumulated. The periwound skin remains a bit macerated but without any obvious breakdown. The wound surface itself has a shiny appearance with a little bit of slough accumulation; no true robust granulation tissue at this time. 01/28/2022: The wound is about the same size but a little bit shallower.  There is a little bit of senescent skin reaccumulation at the cranial aspect. The periwound skin is red but not macerated and without any tissue breakdown. The wound still does not have the most robust surface. There is a bit of slough accumulation. 02/04/2022: The wound  is a bit smaller and the undermining has come in somewhat. There continues to be granulation tissue formation within the wound bed. No significant slough accumulation and his periwound skin is in better condition. 02/18/2022: The wound stinks today. There is no obvious pus but the drainage and wound itself are malodorous. He continues to have heaped up tissue within the wound bed that is rather grayish and not particularly robust-appearing. He is very angry today with his situation. 02/25/2022: Last week, in response to the odor coming from the wound, I took a culture and prescribed topical gentamicin as well as oral cefdinir. Apparently [facility] contacted him about a topical compounded antibiotic, but he did not realize this and he hung up on them. Today, the odor is no longer present. There is some senescent skin around the wound margins as well as continued heaped up granulation tissue in the center of the wound. The undermining continues to contract. 03/04/2022: The wound continues to contract and look better. He still has heaped up hypertrophic granulation tissue near the orifice. No odor was coming from the wound today. He is awaiting [place] delivery. 03/18/2022: 2-week follow-up. [facility] topical antibiotic has been initiated. The chemical cauterization of the hypertrophic granulation tissue was quite successful and the surface is much flatter today. He has heaped up senescent skin around the perimeter. His home health providers have figured out a way to keep the wound VAC from losing suction by bolstering with DuoDERM. Overall there has been significant improvement since our last visit. 04/01/2022: 2-week follow-up. The wound  continues to contract. Once again, there is heaped up senescent skin around the perimeter. He has a little bit of skin breakdown in the distribution of the adhesive drape from the wound VAC. No odor or purulent drainage. No concern for infection. 04/15/2022: 2-week follow-up. He has developed a fairly substantial rash from the drape adhesive for his wound VAC. The periwound has a lot of heaped up epiboly at the margins. The tissue in the wound bed is a little bit pale but there is no odor or purulent drainage. 04/22/2022: Last week we discontinued the VAC. Today, the skin around his wound is in significantly improved condition. His rash is resolving. He does have some heaped up senescent skin around the wound margin and a layer of slough on the wound surface, but there is no concern for active infection. 04/29/2022: The wound continues to contract. The surface is clean. He continues to build up senescent skin around the wound edges that subsequently get a little bit macerated. No concern for infection. 05/06/2022: The wound is smaller again today in all dimensions. The surface is clean. He has his usual accumulation of macerated senescent skin around the wound edges. 05/13/2022: Continued wound contracture. The undermining has decreased quite a bit. Clean surface. Senescent skin heaped up around the wound edges, as per usual. 05/22/2022: The macerated senescent skin has accumulated once again. The wound dimensions are slightly smaller and the surface is clean. He has been having difficulty keeping the packing in the wound due to the large foam border dressing that he prefers. 06/03/2022: The wound is smaller and shallower with less undermining. He has built up macerated senescent skin around the margins, as per usual. He and his friend have figured out a way to make sure that his packing stays in the wound with his [place]. 06/12/2022: The wound is a little bit shallower again today. He still has accumulated  senescent periwound skin, but otherwise the wound is  clean. He will be undergoing bilateral recurrent inguinal hernia repair and umbilical hernia repair next week. 10/20 sacral pressure ulcer. using [facility] and backing wet to dry. Has developed a rash sur rounding the wound 07/03/2022: Continued contracture of the wound. It is very clean and there is no odor or purulent drainage. Continued buildup of senescent skin around the margin. 07/13/2022: There is less undermining present today. The orifice continues to contract. He continues to accumulate senescent skin around the perimeter. The rash around the wound has improved. 07/27/2022: The wound is smaller again today and the undermining is closed then. There is senescent skin accumulation, as usual. 08/07/2022: The wound continues to contract but has its usual accumulation of periwound senescent skin. Unfortunately, the periwound skin looks like it has gotten more moist. It is red and angry and the patient says it is more painful. 08/14/2022: The wound is smaller again this week. His periwound skin is in much better condition. There is still senescent skin around the wound margins and a bit of slough on the surface. 12/18; this is a lower sacral pressure ulcer. We have been using silver alginate for the last 2 weeks. This is longstanding and refractory. At 1 point I think had exposed bone he no longer has this now. He tells me that he has had problems with his right lower leg stump and he is going to see hangers she is therefore not walking but using his wheelchair although he says he is in his wheelchair "only 10% of the time]. Otherwise he claims to be rigorous with offloading this. 09/08/2022: The wound is considerably smaller than the last time I saw it. He has, as usual, the senescent skin accumulation around the wound margin. The undermining and depth, as well as the overall dimensions have come in significantly. 09/21/2022: The wound dimensions  are roughly the same, no significant accumulation of periwound senescent skin. There is some reaccumulation of hypertrophic granulation tissue on the wound bed, however. He is spending a bit more time in his wheelchair due to issues with his prosthetics, but he is hopeful that this will be resolved within the next week or so. 10/05/2022: The undermined portion of his wound has come in quite a bit. They are having difficulty getting the silver alginate to stay in place due to the decrease in wound size. He has accumulated senescent skin around the wound margins. BAY, WAYSON ([id number]) [id number] Page 7 of 10 Patient History Information obtained from Patient, Caregiver, Chart. Family History Cancer - Father, Heart Disease - Father,Paternal Grandparents, Hypertension - Father, No family history of Diabetes, Hereditary Spherocytosis, Kidney Disease, Lung Disease, Seizures, Stroke, Thyroid Problems, Tuberculosis. Social History Never smoker, Marital Status - Divorced, Alcohol Use - Never, Drug Use - No History, Caffeine Use - Moderate - coffee. Medical History Eyes Denies history of Cataracts, Glaucoma, Optic Neuritis Ear/Nose/Mouth/Throat Denies history of Chronic sinus problems/congestion, Middle ear problems Cardiovascular Patient has history of Congestive Heart Failure, Hypertension Endocrine Patient has history of Type II Diabetes Denies history of Type I Diabetes Genitourinary Denies history of End Stage Renal Disease Integumentary (Skin) Denies history of History of Burn Musculoskeletal Patient has history of Osteomyelitis - S1 and coccyx Oncologic Denies history of Received Chemotherapy, Received Radiation Psychiatric Patient has history of Confinement Anxiety Denies history of Anorexia/bulimia Hospitalization/Surgery History - bil BKA. Medical A Surgical History Notes nd Respiratory pulmonary nodules Genitourinary renal mass, urinary  hesitancy Musculoskeletal discitis of thoracic region Objective Constitutional Hypertensive, asymptomatic. Slightly tachycardic. no acute  distress. Vitals Time Taken: 1:24 PM, Height: 75 in, Weight: 220 lbs, BMI: 27.5, Temperature: 97.7 F, Pulse: 102 bpm, Respiratory Rate: 18 breaths/min, Blood Pressure: 154/100 mmHg, Capillary Blood Glucose: 135 mg/dl. General Notes: glucose per pt report this am Respiratory Normal work of breathing on room air. General Notes: 10/05/2022: The undermined portion of his wound has come in quite a bit. They are having difficulty getting the silver alginate to stay in place due to the decrease in wound size. He has accumulated senescent skin around the wound margins. Integumentary (Hair, Skin) Wound #1 status is Open. Original cause of wound was Pressure Injury. The date acquired was: 07/29/2021. The wound has been in treatment 38 weeks. The wound is located on the Sacrum. The wound measures 0.8cm length x 0.5cm width x 0.9cm depth; 0.314cm^2 area and 0.283cm^3 volume. There is Fat Layer (Subcutaneous Tissue) exposed. There is no tunneling noted, however, there is undermining starting at 12:00 and ending at 6:00 with a maximum distance of 0.5cm. There is a medium amount of serosanguineous drainage noted. The wound margin is epibole. There is large (67-100%) pink, pale granulation within the wound bed. There is a small (1-33%) amount of necrotic tissue within the wound bed including Adherent Slough. The periwound skin appearance had no abnormalities noted for texture. The periwound skin appearance had no abnormalities noted for color. The periwound skin appearance did not exhibit: Dry/Scaly, Maceration. Periwound temperature was noted as No Abnormality. Assessment Active Problems ICD-10 Pressure ulcer of sacral region, stage 4 Acquired absence of left leg below knee Acquired absence of right leg below knee RICHERD, GRIME ([id number])  [phone].pdf Page 8 of 10 Procedures Wound #1 Pre-procedure diagnosis of Wound #1 is a Pressure Ulcer located on the Sacrum . There was a Selective/Open Wound Skin/Epidermis Debridement with a total area of 0.4 sq cm performed by [name], MD. With the following instrument(s): Curette to remove Non-Viable tissue/material. Material removed includes Skin: Epidermis after achieving pain control using Lidocaine 4% Topical Solution. No specimens were taken. A time out was conducted at 13:40, prior to the start of the procedure. A Minimum amount of bleeding was controlled with Pressure. The procedure was tolerated well with a pain level of 0 throughout and a pain level of 0 following the procedure. Post Debridement Measurements: 0.8cm length x 0.5cm width x 0.9cm depth; 0.283cm^3 volume. Post debridement Stage noted as Category/Stage IV. Character of Wound/Ulcer Post Debridement is improved. Post procedure Diagnosis Wound #1: Same as Pre-Procedure General Notes: Scribed for Dr. Celine Ahr by Baruch Gouty RN. Plan Follow-up Appointments: Return Appointment in 2 weeks. - Dr. Celine Ahr - room 1 Monday 2/12 @ 1:15 pm Anesthetic: Wound #1 Sacrum: (In clinic) Topical Lidocaine 4% applied to wound bed Off-Loading: Gel mattress overlay (Group 1) Turn and reposition every 2 hours WOUND #1: - Sacrum Wound Laterality: Cleanser: Soap and Water 3 x Per Week/30 Days Discharge Instructions: May shower and wash wound with dial antibacterial soap and water prior to dressing change. Cleanser: Wound Cleanser 3 x Per Week/30 Days Discharge Instructions: Cleanse the wound with wound cleanser prior to applying a clean dressing using gauze sponges, not tissue or cotton balls. Peri-Wound Care: Skin Prep 3 x Per Week/30 Days Discharge Instructions: Use skin prep as directed Peri-Wound Care: Zinc Oxide Ointment 30g tube 3 x Per Week/30 Days Discharge Instructions: Apply Zinc Oxide to  macerated periwound with each dressing change as needed Prim Dressing: Promogran Prisma Matrix, 4.34 (sq in) (silver collagen) 3 x Per Week/30 Days  ary Discharge Instructions: Moisten collagen with saline and place in base or wound Secondary Dressing: Woven Gauze Sponges 2x2 in 3 x Per Week/30 Days Discharge Instructions: Moisten with saline and pack into wound lightly to hold collagen in place Secondary Dressing: Zetuvit Plus Silicone Border Sacrum Dressing, Sm, 7x7 (in/in) (Generic) 3 x Per Week/30 Days Discharge Instructions: Apply silicone border over primary dressing as directed. Secured With: 37M Medipore H Soft Cloth Surgical T ape, 4 x 10 (in/yd) (Generic) 3 x Per Week/30 Days Discharge Instructions: Secure with tape as directed. Add-Ons: Gloves, Large 3 x Per Week/30 Days 10/05/2022: The undermined portion of his wound has come in quite a bit. They are having difficulty getting the silver alginate to stay in place due to the decrease in wound size. He has accumulated senescent skin around the wound margins. I used a curette to debride the senescent skin from around his wound. I am going to change his contact layer to Prisma silver collagen, as I think this will stay in place a little bit better. We will cover this with gauze and a foam border dressing. Follow-up in 2 weeks. Electronic Signature(s) Signed: 10/05/2022 1:59:21 PM By: [name] MD FACS Entered By: [name] on 10/05/2022 13:59:20 -------------------------------------------------------------------------------- HxROS Details Patient Name: Date of Service: [name], [name] 10/05/2022 1:15 PM Medical Record Number: [id number] Patient Account Number: [id number] Date of Birth/Sex: Treating RN: 01/26/1966 (57 y.o. M) Primary Care Provider: Berniece Pap Other Clinician: Referring Provider: Treating Provider/Extender: Lannie Fields in Treatment: [address], [place] ([id number])  [id number] Page 9 of 10 Information Obtained From Patient Caregiver Chart Eyes Medical History: Negative for: Cataracts; Glaucoma; Optic Neuritis Ear/Nose/Mouth/Throat Medical History: Negative for: Chronic sinus problems/congestion; Middle ear problems Respiratory Medical History: Past Medical History Notes: pulmonary nodules Cardiovascular Medical History: Positive for: Congestive Heart Failure; Hypertension Endocrine Medical History: Positive for: Type II Diabetes Negative for: Type I Diabetes Time with diabetes: since 2004 Treated with: Insulin Blood sugar tested every day: Yes Tested : 4 times per day Genitourinary Medical History: Negative for: End Stage Renal Disease Past Medical History Notes: renal mass, urinary hesitancy Integumentary (Skin) Medical History: Negative for: History of Burn Musculoskeletal Medical History: Positive for: Osteomyelitis - S1 and coccyx Past Medical History Notes: discitis of thoracic region Oncologic Medical History: Negative for: Received Chemotherapy; Received Radiation Psychiatric Medical History: Positive for: Confinement Anxiety Negative for: Anorexia/bulimia Immunizations Pneumococcal Vaccine: Received Pneumococcal Vaccination: No Implantable Devices No devices added Hospitalization / Surgery History Type of Hospitalization/Surgery bil BKA Family and Social History Cancer: Yes - Father; Diabetes: No; Heart Disease: Yes - Father,Paternal Grandparents; Hereditary Spherocytosis: No; Hypertension: Yes - Father; Kidney Disease: No; Lung Disease: No; Seizures: No; Stroke: No; Thyroid Problems: No; Tuberculosis: No; Never smoker; Marital Status - Divorced; Alcohol UseMICHEIL, KLAUS ([id number]) [id number] Page 10 of 10 Never; Drug Use: No History; Caffeine Use: Moderate - coffee; Financial Concerns: No; Food, Clothing or Shelter Needs: No; Support System Lacking:  No; Transportation Concerns: Yes - hurts to transfer Electronic Signature(s) Signed: 10/05/2022 4:07:33 PM By: [name] MD FACS Entered By: [name] on 10/05/2022 13:58:14 -------------------------------------------------------------------------------- SuperBill Details Patient Name: Date of Service: [name], [name] 10/05/2022 Medical Record Number: [id number] Patient Account Number: [id number] Date of Birth/Sex: Treating RN: [birth year] (57 y.o. M) Primary Care Provider: Berniece Pap Other Clinician: Referring Provider: Treating Provider/Extender: Lannie Fields in Treatment: 38 Diagnosis Coding ICD-10 Codes Code Description L89.154 Pressure ulcer of sacral region, stage 4  K98.286 Acquired absence of left leg below knee Z89.511 Acquired absence of right leg below knee Facility Procedures : CPT4 Code: 75198242 Description: 99806 - DEBRIDE WOUND 1ST 20 SQ CM OR < ICD-10 Diagnosis Description L89.154 Pressure ulcer of sacral region, stage 4 Modifier: Quantity: 1 Physician Procedures : CPT4 Code Description Modifier [id number] - WC PHYS LEVEL 3 - EST PT 25 ICD-10 Diagnosis Description L89.154 Pressure ulcer of sacral region, stage 4 Z89.512 Acquired absence of left leg below knee Z89.511 Acquired absence of right leg below knee Quantity: 1 : [id number] - WC PHYS DEBR WO ANESTH 20 SQ CM ICD-10 Diagnosis Description L89.154 Pressure ulcer of sacral region, stage 4 Quantity: 1 Electronic Signature(s) Signed: 10/05/2022 1:59:48 PM By: [name] MD FACS Entered By: [name] on 10/05/2022 13:59:48

## 2022-10-08 ENCOUNTER — Ambulatory Visit (INDEPENDENT_AMBULATORY_CARE_PROVIDER_SITE_OTHER): Payer: Medicare HMO | Admitting: Orthopedic Surgery

## 2022-10-08 DIAGNOSIS — Z89512 Acquired absence of left leg below knee: Secondary | ICD-10-CM | POA: Diagnosis not present

## 2022-10-08 DIAGNOSIS — Z89511 Acquired absence of right leg below knee: Secondary | ICD-10-CM | POA: Diagnosis not present

## 2022-10-09 ENCOUNTER — Encounter: Payer: Self-pay | Admitting: Orthopedic Surgery

## 2022-10-09 ENCOUNTER — Telehealth: Payer: Self-pay | Admitting: Internal Medicine

## 2022-10-09 NOTE — Progress Notes (Signed)
Office Visit Note   Patient: [name]           Date of Birth: 28-Apr-1966           MRN: [id number] Visit Date: 10/08/2022              Requested by: Loralee Pacas, [address] [place],  [place] [address] PCP: Loralee Pacas, MD  Chief Complaint  Patient presents with   Right Leg - Wound Check    Right bka      HPI: Patient is a 57 year old gentleman who is seen for bilateral transtibial amputations.  Patient has had a difficult time with prosthetic fitting.  He had torn liners that were replaced with thicker liners but patient states he is now having increased pressure on the residual limb with the thicker liners.  Patient states he has redness circumferentially around his leg and he is worried about developing an ulcer.  His current sockets are about 90 months old.  Patient states he developed stage IV bedsores when he was in the ICU.  Patient states he also has difficulty sliding the new liner into the current liner socket combination he is using.  Assessment & Plan: Visit Diagnoses:  1. S/P bilateral below knee amputation ([place])     Plan: Patient is provided a new prescription for new liners.  Patient may require new sockets.  Follow-Up Instructions: Return if symptoms worsen or fail to improve.   Ortho Exam  Patient is alert, oriented, no adenopathy, well-dressed, normal affect, normal respiratory effort. Examination the residual limbs there is no redness no cellulitis no pressure points no callus.  Patient does have difficulty getting the liners into the socket.  Imaging: No results found. No images are attached to the encounter.  Labs: Lab Results  Component Value Date   HGBA1C 11.9 (H) 06/15/2022   HGBA1C 8.6 (A) 12/05/2021   HGBA1C 8.6 12/05/2021   HGBA1C 8.6 (A) 12/05/2021   HGBA1C 8.6 (A) 12/05/2021   ESRSEDRATE 25 (H) 07/03/2022   ESRSEDRATE 11 12/30/2021   ESRSEDRATE 12 12/29/2021   CRP <1.0 07/03/2022   CRP 0.7 12/30/2021   CRP 1.1  (H) 12/29/2021   REPTSTATUS 04/11/2022 FINAL 04/09/2022   CULT >=100,000 COLONIES/mL PSEUDOMONAS AERUGINOSA (A) 04/09/2022   LABORGA PSEUDOMONAS AERUGINOSA (A) 04/09/2022     Lab Results  Component Value Date   ALBUMIN 4.0 07/03/2022   ALBUMIN 3.2 (L) 12/28/2021   ALBUMIN 2.4 (L) 12/16/2021    Lab Results  Component Value Date   MG 1.7 12/18/2021   MG 1.4 (L) 12/16/2021   MG 1.5 (L) 08/15/2021   No results found for: "VD25OH"  No results found for: "PREALBUMIN"    Latest Ref Rng & Units 07/03/2022    1:38 PM 06/15/2022    2:32 PM 12/31/2021    1:11 AM  CBC EXTENDED  WBC 4.0 - 10.5 K/uL 4.6  5.4  8.3   RBC 4.22 - 5.81 Mil/uL 5.80  6.05  4.82   Hemoglobin 13.0 - 17.0 g/dL 17.7  18.6  14.3   HCT 39.0 - 52.0 % 53.3  55.1  42.8   Platelets 150.0 - 400.0 K/uL 179.0  194  253      There is no height or weight on file to calculate BMI.  Orders:  No orders of the defined types were placed in this encounter.  No orders of the defined types were placed in this encounter.    Procedures: No procedures  performed  Clinical Data: No additional findings.  ROS:  All other systems negative, except as noted in the HPI. Review of Systems  Objective: Vital Signs: There were no vitals taken for this visit.  Specialty Comments:  No specialty comments available.  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   Type 1 diabetes mellitus with complications ([place]) 78/46/9629   Malaise 06/22/2022   Polycythemia 06/22/2022   Amputation below knee ([place]) 04/23/2022   PTSD (post-traumatic stress disorder) 04/23/2022   Generalized anxiety disorder 01/21/2022   Episode of unresponsiveness 12/15/2021   Acute respiratory failure with hypoxia (HCC) secondary to suspected aspiration pneumonia 12/15/2021   Renal mass 12/15/2021   Stage IV pressure ulcer of sacral region ([place]) 09/15/2021   Insomnia 09/14/2021   Chronic  systolic CHF (congestive heart failure) ([place]) 09/14/2021   Hypogonadism in male 09/14/2021   Chronic pain 09/14/2021   Pseudohyponatremia 09/13/2021   Normocytic anemia 09/13/2021   Discitis of thoracic region    Paroxysmal atrial flutter (HCC) 07/22/2021   Chronic diastolic (congestive) heart failure ([place]) 05/16/2021   Lumbar spondylosis 03/25/2021   H/O amputation of leg through tibia and fibula ([place]) 11/10/2019   Degeneration of lumbar intervertebral disc 11/10/2019   Hypercholesterolemia 11/10/2019   Left below-knee amputee ([place]) 10/30/2019   Dyslipidemia 10/30/2019   Type 1 diabetes mellitus with diabetic polyneuropathy, with long-term current use of insulin ([place]) 10/12/2019   Right below-elbow amputee 10/12/2019   Hypertensive disorder 09/25/2019   Urinary hesitancy 09/25/2019   Prosthesis adjustments 09/25/2019   Erectile dysfunction 09/25/2019   Palliative care status 09/25/2019   Pulmonary nodules 09/25/2019   Depressive disorder 09/25/2019   Past Medical History:  Diagnosis Date   Acquired complex renal cyst 09/25/2019   AKI (acute kidney injury) ([place]) 07/22/2021   Cancer ([place])    renal   Chest pain 52/84/1324   Complication of anesthesia    Decubitus ulcer of sacral area 12/15/2021   Depression    Diabetes mellitus without complication ([place])    Diabetic infection of left foot ([place]) 04/17/2021   Episode of unresponsiveness 12/15/2021   Gas gangrene of foot ([place]) 07/22/2021   Healthcare maintenance 09/25/2019   Hematuria 09/25/2019   History of kidney stones    Lactic acidosis 12/15/2021   Leukocytosis 09/13/2021   Osteomyelitis (HCC)    Sacral pressure injury of skin 07/27/2021   Septic shock ([place]) 07/22/2021   Severe sepsis with acute organ dysfunction ([place]) 07/22/2021   Shortness of breath 05/16/2021   Stage 3a chronic kidney disease ([place]) 40/06/2724   Systolic dysfunction    Type 2 diabetes mellitus 10/12/2019    Family History  Problem Relation Age  of Onset   Anxiety disorder Mother    Cancer Father    Coronary artery disease Father     Past Surgical History:  Procedure Laterality Date   AMPUTATION Left 07/23/2021   Procedure: LEFT BELOW KNEE AMPUTATION;  Surgeon: [name], MD;  Location: [facility];  Service: Orthopedics;  Laterality: Left;   BELOW KNEE LEG AMPUTATION Right    BUBBLE STUDY  07/29/2021   Procedure: BUBBLE STUDY;  Surgeon: Sueanne Margarita, MD;  Location: [facility];  Service: Cardiovascular;;   CARDIOVERSION N/A 07/29/2021   Procedure: CARDIOVERSION;  Surgeon: Sueanne Margarita, MD;  Location[facility] ENDOSCOPY;  Service: Cardiovascular;  Laterality: N/A;   RADIOLOGY WITH ANESTHESIA N/A 08/05/2021   Procedure: MRI LUMBAR WITH AND WITHOUT; THORASIC  SPINE WITH AND WITHOUT WITH ANESTHESIA;  Surgeon: Radiologist, Medication, MD;  Location: [facility];  Service: Radiology;  Laterality: N/A;   RADIOLOGY WITH ANESTHESIA N/A 08/07/2021   Procedure: MRI WITH LUMBER WITH AND WITHOUT CONTRAST,THORACIC WITH AND WITHOUT CONTRAST;  Surgeon: Radiologist, Medication, MD;  Location: [facility];  Service: Radiology;  Laterality: N/A;   RADIOLOGY WITH ANESTHESIA N/A 09/13/2021   Procedure: MRI WITH ANESTHESIA;  Surgeon: Luanne Bras, MD;  Location: [facility];  Service: Radiology;  Laterality: N/A;   TEE WITHOUT CARDIOVERSION N/A 07/29/2021   Procedure: TRANSESOPHAGEAL ECHOCARDIOGRAM (TEE);  Surgeon: Sueanne Margarita, MD;  Location: [facility] ENDOSCOPY;  Service: Cardiovascular;  Laterality: N/A;   Social History   Occupational History   Not on file  Tobacco Use   Smoking status: Former    Types: Cigarettes   Smokeless tobacco: Current    Types: Chew  Vaping Use   Vaping Use: Never used  Substance and Sexual Activity   Alcohol use: Not Currently   Drug use: Never   Sexual activity: Not Currently

## 2022-10-09 NOTE — Telephone Encounter (Signed)
Patient requests RX for Methocarbam 500 MG (muscle relaxer-prescribed at [facility] 09/12/2021 for back pain) be sent to:  [facility] in [place].  Patient declined office visit

## 2022-10-12 ENCOUNTER — Encounter: Payer: Medicare HMO | Admitting: Physical Therapy

## 2022-10-12 NOTE — Telephone Encounter (Signed)
Pt is calling back and would like to know the status of the RX. Please advise

## 2022-10-13 DIAGNOSIS — L89154 Pressure ulcer of sacral region, stage 4: Secondary | ICD-10-CM | POA: Diagnosis not present

## 2022-10-13 NOTE — Telephone Encounter (Signed)
Pt called back again and would like to know the status.

## 2022-10-14 ENCOUNTER — Other Ambulatory Visit: Payer: Self-pay

## 2022-10-14 DIAGNOSIS — M549 Dorsalgia, unspecified: Secondary | ICD-10-CM

## 2022-10-14 MED ORDER — METHOCARBAMOL 500 MG PO TABS: 500.0000 mg | ORAL_TABLET | Freq: Four times a day (QID) | ORAL | 0 refills | Status: DC

## 2022-10-14 NOTE — Telephone Encounter (Signed)
Sent prescription in to [facility] on file.

## 2022-10-14 NOTE — Telephone Encounter (Signed)
Patient called for an update on medication. Informed patient message has been sent, provider out of office. Patient wants to know if another provider could fill.

## 2022-10-19 ENCOUNTER — Encounter (HOSPITAL_BASED_OUTPATIENT_CLINIC_OR_DEPARTMENT_OTHER): Payer: Medicare HMO | Attending: General Surgery | Admitting: General Surgery

## 2022-10-19 DIAGNOSIS — Z89512 Acquired absence of left leg below knee: Secondary | ICD-10-CM | POA: Insufficient documentation

## 2022-10-19 DIAGNOSIS — Z89511 Acquired absence of right leg below knee: Secondary | ICD-10-CM | POA: Insufficient documentation

## 2022-10-19 DIAGNOSIS — I509 Heart failure, unspecified: Secondary | ICD-10-CM | POA: Diagnosis not present

## 2022-10-19 DIAGNOSIS — L89154 Pressure ulcer of sacral region, stage 4: Secondary | ICD-10-CM | POA: Insufficient documentation

## 2022-10-19 DIAGNOSIS — I110 Hypertensive heart disease with heart failure: Secondary | ICD-10-CM | POA: Diagnosis not present

## 2022-10-19 NOTE — Progress Notes (Signed)
[name], [name] ([id number]) [id number] Page 1 of 9 Visit Report for 10/19/2022 Chief Complaint Document Details Patient Name: Date of Service: [place], [name] 10/19/2022 1:15 PM Medical Record Number: [id number] Patient Account Number: [id number] Date of Birth/Sex: Treating RN: [birth year] (57 y.o. M) Primary Care Provider: Berniece Pap Other Clinician: Referring Provider: Treating Provider/Extender: Lannie Fields in Treatment: 8 Information Obtained from: Patient Chief Complaint Patient is at the clinic for treatment of an open pressure ulcer Electronic Signature(s) Signed: 10/19/2022 2:22:15 PM By: [name] MD FACS Entered By: [name] on 10/19/2022 14:22:15 -------------------------------------------------------------------------------- HPI Details Patient Name: Date of Service: [name], [name] 10/19/2022 1:15 PM Medical Record Number: [id number] Patient Account Number: [id number] Date of Birth/Sex: Treating RN: March 20, 1966 (57 y.o. M) Primary Care Provider: Berniece Pap Other Clinician: Referring Provider: Treating Provider/Extender: Lannie Fields in Treatment: 53 History of Present Illness HPI Description: ADMISSION 01/08/2022 This is a 57 year old male with a past medical history notable for type 2 diabetes mellitus (last A1c was 8.6) congestive heart failure, hypertension, chronic pain, and bilateral below-knee amputations. His most recent amputation was in November 2022. While he was in the hospital, he developed a sacral pressure ulcer. He was subsequently in a skilled nursing facility for some time. He was discharged with home health and had been in a wound VAC, but was then admitted to the hospital last week when the wound appeared to be worsening. Apparently the periwound skin was macerated and the device that he had been using was leaking quite a bit. Evaluation while in the  hospital included a consultation with infectious disease who did not think he had any evidence of osteomyelitis, plastic surgery who felt that he was not an appropriate flap candidate (their note also states that he is not interested in a flap) and wound care who took him out of the wound VAC and initiated wet-to-dry dressing changes. He has a new wound VAC from [facility] on order, anticipated delivery today. The wound itself is fairly small and isolated to the sacrum. There is muscle exposed. No bone is appreciated but it is palpable beneath the surface. The muscle itself is bit pale and there is heaped up epibole around the wound edges. No significant odor or drainage. 01/14/2022: His wound VAC was not initiated until this past Friday. He has not had any issues with the VAC but today we found that the bridge foam was applied directly to the skin rather than over a layer of adhesive drape. His home health nurse also requested that we consider applying silver collagen to the wound bed in addition to the VAC. There is still a little bit of heaped up senescent skin around the wound periphery. No significant change to the wound dimensions. 01/21/2022: No significant change to the wound dimensions. The senescent skin has not reaccumulated. The periwound skin remains a bit macerated but without any obvious breakdown. The wound surface itself has a shiny appearance with a little bit of slough accumulation; no true robust granulation tissue at this time. 01/28/2022: The wound is about the same size but a little bit shallower. There is a little bit of senescent skin reaccumulation at the cranial aspect. The periwound skin is red but not macerated and without any tissue breakdown. The wound still does not have the most robust surface. There is a bit of slough accumulation. 02/04/2022: The wound is a bit smaller and the undermining has come in somewhat. There continues to be granulation tissue  formation within the wound  bed. No significant slough accumulation and his periwound skin is in better condition. 02/18/2022: The wound stinks today. There is no obvious pus but the drainage and wound itself are malodorous. He continues to have heaped up tissue within the wound bed that is rather grayish and not particularly robust-appearing. He is very angry today with his situation. 02/25/2022: Last week, in response to the odor coming from the wound, I took a culture and prescribed topical gentamicin as well as oral cefdinir. Apparently MAXIMILLAN, BAIAMONTE ([id number]) [id number] Page 2 of 43 [facility] contacted him about a topical compounded antibiotic, but he did not realize this and he hung up on them. Today, the odor is no longer present. There is some senescent skin around the wound margins as well as continued heaped up granulation tissue in the center of the wound. The undermining continues to contract. 03/04/2022: The wound continues to contract and look better. He still has heaped up hypertrophic granulation tissue near the orifice. No odor was coming from the wound today. He is awaiting [place] delivery. 03/18/2022: 2-week follow-up. [facility] topical antibiotic has been initiated. The chemical cauterization of the hypertrophic granulation tissue was quite successful and the surface is much flatter today. He has heaped up senescent skin around the perimeter. His home health providers have figured out a way to keep the wound VAC from losing suction by bolstering with DuoDERM. Overall there has been significant improvement since our last visit. 04/01/2022: 2-week follow-up. The wound continues to contract. Once again, there is heaped up senescent skin around the perimeter. He has a little bit of skin breakdown in the distribution of the adhesive drape from the wound VAC. No odor or purulent drainage. No concern for infection. 04/15/2022: 2-week follow-up. He has developed a fairly substantial rash  from the drape adhesive for his wound VAC. The periwound has a lot of heaped up epiboly at the margins. The tissue in the wound bed is a little bit pale but there is no odor or purulent drainage. 04/22/2022: Last week we discontinued the VAC. Today, the skin around his wound is in significantly improved condition. His rash is resolving. He does have some heaped up senescent skin around the wound margin and a layer of slough on the wound surface, but there is no concern for active infection. 04/29/2022: The wound continues to contract. The surface is clean. He continues to build up senescent skin around the wound edges that subsequently get a little bit macerated. No concern for infection. 05/06/2022: The wound is smaller again today in all dimensions. The surface is clean. He has his usual accumulation of macerated senescent skin around the wound edges. 05/13/2022: Continued wound contracture. The undermining has decreased quite a bit. Clean surface. Senescent skin heaped up around the wound edges, as per usual. 05/22/2022: The macerated senescent skin has accumulated once again. The wound dimensions are slightly smaller and the surface is clean. He has been having difficulty keeping the packing in the wound due to the large foam border dressing that he prefers. 06/03/2022: The wound is smaller and shallower with less undermining. He has built up macerated senescent skin around the margins, as per usual. He and his friend have figured out a way to make sure that his packing stays in the wound with his [place]. 06/12/2022: The wound is a little bit shallower again today. He still has accumulated senescent periwound skin, but otherwise the wound is clean. He will be undergoing bilateral recurrent inguinal hernia  repair and umbilical hernia repair next week. 10/20 sacral pressure ulcer. using [facility] and backing wet to dry. Has developed a rash sur rounding the wound 07/03/2022: Continued contracture of the  wound. It is very clean and there is no odor or purulent drainage. Continued buildup of senescent skin around the margin. 07/13/2022: There is less undermining present today. The orifice continues to contract. He continues to accumulate senescent skin around the perimeter. The rash around the wound has improved. 07/27/2022: The wound is smaller again today and the undermining is closed then. There is senescent skin accumulation, as usual. 08/07/2022: The wound continues to contract but has its usual accumulation of periwound senescent skin. Unfortunately, the periwound skin looks like it has gotten more moist. It is red and angry and the patient says it is more painful. 08/14/2022: The wound is smaller again this week. His periwound skin is in much better condition. There is still senescent skin around the wound margins and a bit of slough on the surface. 12/18; this is a lower sacral pressure ulcer. We have been using silver alginate for the last 2 weeks. This is longstanding and refractory. At 1 point I think had exposed bone he no longer has this now. He tells me that he has had problems with his right lower leg stump and he is going to see hangers she is therefore not walking but using his wheelchair although he says he is in his wheelchair "only 10% of the time]. Otherwise he claims to be rigorous with offloading this. 09/08/2022: The wound is considerably smaller than the last time I saw it. He has, as usual, the senescent skin accumulation around the wound margin. The undermining and depth, as well as the overall dimensions have come in significantly. 09/21/2022: The wound dimensions are roughly the same, no significant accumulation of periwound senescent skin. There is some reaccumulation of hypertrophic granulation tissue on the wound bed, however. He is spending a bit more time in his wheelchair due to issues with his prosthetics, but he is hopeful that this will be resolved within the next week  or so. 10/05/2022: The undermined portion of his wound has come in quite a bit. They are having difficulty getting the silver alginate to stay in place due to the decrease in wound size. He has accumulated senescent skin around the wound margins. 10/19/2022: The wound is down to just a couple of millimeters. Electronic Signature(s) Signed: 10/19/2022 2:22:38 PM By: [name] MD FACS Entered By: [name] on 10/19/2022 14:22:37 -------------------------------------------------------------------------------- Physical Exam Details Patient Name: Date of Service: [name], [name] 10/19/2022 1:15 PM Medical Record Number: [id number] Patient Account Number: [id number] BRAN, LYU ([id number]) [phone].pdf Page 3 of 9 Date of Birth/Sex: Treating RN: August 09, 1966 (57 y.o. M) Primary Care Provider: Other Clinician: Berniece Pap Referring Provider: Treating Provider/Extender: Lannie Fields in Treatment: 60 Constitutional Hypertensive, asymptomatic. . . . no acute distress. Respiratory Normal work of breathing on room air. Notes 10/19/2022: The wound is down to just a couple of millimeters. Electronic Signature(s) Signed: 10/19/2022 2:23:20 PM By: [name] MD FACS Entered By: [name] on 10/19/2022 14:23:20 -------------------------------------------------------------------------------- Physician Orders Details Patient Name: Date of Service: [name], [name] 10/19/2022 1:15 PM Medical Record Number: [id number] Patient Account Number: [id number] Date of Birth/Sex: Treating RN: Aug 04, 1966 (57 y.o. [name] Primary Care Provider: Berniece Pap Other Clinician: Referring Provider: Treating Provider/Extender: Lannie Fields in Treatment: [phone] Verbal / Phone Orders: No Diagnosis Coding  ICD-10 Coding Code Description L89.154 Pressure ulcer of sacral region, stage 4 Z89.512 Acquired  absence of left leg below knee Z89.511 Acquired absence of right leg below knee Follow-up Appointments ppointment in 2 weeks. - Dr. Celine Ahr - room 1 Return A Monday 2/26 @ 1:15 pm Anesthetic Wound #1 Sacrum (In clinic) Topical Lidocaine 4% applied to wound bed Off-Loading Gel mattress overlay (Group 1) Turn and reposition every 2 hours Wound Treatment Wound #1 - Sacrum Cleanser: Soap and Water 3 x Per Week/30 Days Discharge Instructions: May shower and wash wound with dial antibacterial soap and water prior to dressing change. Cleanser: Wound Cleanser 3 x Per Week/30 Days Discharge Instructions: Cleanse the wound with wound cleanser prior to applying a clean dressing using gauze sponges, not tissue or cotton balls. Peri-Wound Care: Skin Prep 3 x Per Week/30 Days Discharge Instructions: Use skin prep as directed Peri-Wound Care: Zinc Oxide Ointment 30g tube 3 x Per Week/30 Days Discharge Instructions: Apply Zinc Oxide to macerated periwound with each dressing change as needed Prim Dressing: Promogran Prisma Matrix, 4.34 (sq in) (silver collagen) 3 x Per Week/30 Days ary Discharge Instructions: Moisten collagen with saline and place in base or wound Hietala, Zidan ([id number] Page 4 of 9 Secondary Dressing: Woven Gauze Sponges 2x2 in 3 x Per Week/30 Days Discharge Instructions: Moisten with saline and pack into wound lightly to hold collagen in place Secondary Dressing: Zetuvit Plus Silicone Border Sacrum Dressing, Sm, 7x7 (in/in) (Generic) 3 x Per Week/30 Days Discharge Instructions: Apply silicone border over primary dressing as directed. Secured With: 67M Medipore H Soft Cloth Surgical T ape, 4 x 10 (in/yd) (Generic) 3 x Per Week/30 Days Discharge Instructions: Secure with tape as directed. Add-Ons: Gloves, Large 3 x Per Week/30 Days Electronic Signature(s) Signed: 10/19/2022 4:34:30 PM By: [name] MD FACS Entered By: [name]  on 10/19/2022 14:23:31 -------------------------------------------------------------------------------- Problem List Details Patient Name: Date of Service: [name], [name] 10/19/2022 1:15 PM Medical Record Number: [id number] Patient Account Number: [id number] Date of Birth/Sex: Treating RN: 11/19/1965 (57 y.o. [name] Primary Care Provider: Berniece Pap Other Clinician: Referring Provider: Treating Provider/Extender: Lannie Fields in Treatment: 12 Active Problems ICD-10 Encounter Code Description Active Date MDM Diagnosis L89.154 Pressure ulcer of sacral region, stage 4 01/08/2022 No Yes Z89.512 Acquired absence of left leg below knee 01/08/2022 No Yes Z89.511 Acquired absence of right leg below knee 01/08/2022 No Yes Inactive Problems Resolved Problems Electronic Signature(s) Signed: 10/19/2022 2:21:21 PM By: [name] MD FACS Entered By: [name] on 10/19/2022 14:21:21 -------------------------------------------------------------------------------- Progress Note Details Patient Name: Date of Service: [name], [name] 10/19/2022 1:15 PM Medical Record Number: [id number] Patient Account Number: [id number] Date of Birth/Sex: Treating RN: 1966-03-24 (57 y.o. M) Primary Care Provider: Berniece Pap Other Clinician: [name] ([id number]) [id number] Page 5 of 9 Referring Provider: Treating Provider/Extender: Lannie Fields in Treatment: 12 Subjective Chief Complaint Information obtained from Patient Patient is at the clinic for treatment of an open pressure ulcer History of Present Illness (HPI) ADMISSION 01/08/2022 This is a 57 year old male with a past medical history notable for type 2 diabetes mellitus (last A1c was 8.6) congestive heart failure, hypertension, chronic pain, and bilateral below-knee amputations. His most recent amputation was in November 2022. While he was  in the hospital, he developed a sacral pressure ulcer. He was subsequently in a skilled nursing facility for some time. He was discharged with home health and had been in a wound  VAC, but was then admitted to the hospital last week when the wound appeared to be worsening. Apparently the periwound skin was macerated and the device that he had been using was leaking quite a bit. Evaluation while in the hospital included a consultation with infectious disease who did not think he had any evidence of osteomyelitis, plastic surgery who felt that he was not an appropriate flap candidate (their note also states that he is not interested in a flap) and wound care who took him out of the wound VAC and initiated wet-to-dry dressing changes. He has a new wound VAC from [facility] on order, anticipated delivery today. The wound itself is fairly small and isolated to the sacrum. There is muscle exposed. No bone is appreciated but it is palpable beneath the surface. The muscle itself is bit pale and there is heaped up epibole around the wound edges. No significant odor or drainage. 01/14/2022: His wound VAC was not initiated until this past Friday. He has not had any issues with the VAC but today we found that the bridge foam was applied directly to the skin rather than over a layer of adhesive drape. His home health nurse also requested that we consider applying silver collagen to the wound bed in addition to the VAC. There is still a little bit of heaped up senescent skin around the wound periphery. No significant change to the wound dimensions. 01/21/2022: No significant change to the wound dimensions. The senescent skin has not reaccumulated. The periwound skin remains a bit macerated but without any obvious breakdown. The wound surface itself has a shiny appearance with a little bit of slough accumulation; no true robust granulation tissue at this time. 01/28/2022: The wound is about the same size but a little bit  shallower. There is a little bit of senescent skin reaccumulation at the cranial aspect. The periwound skin is red but not macerated and without any tissue breakdown. The wound still does not have the most robust surface. There is a bit of slough accumulation. 02/04/2022: The wound is a bit smaller and the undermining has come in somewhat. There continues to be granulation tissue formation within the wound bed. No significant slough accumulation and his periwound skin is in better condition. 02/18/2022: The wound stinks today. There is no obvious pus but the drainage and wound itself are malodorous. He continues to have heaped up tissue within the wound bed that is rather grayish and not particularly robust-appearing. He is very angry today with his situation. 02/25/2022: Last week, in response to the odor coming from the wound, I took a culture and prescribed topical gentamicin as well as oral cefdinir. Apparently [facility] contacted him about a topical compounded antibiotic, but he did not realize this and he hung up on them. Today, the odor is no longer present. There is some senescent skin around the wound margins as well as continued heaped up granulation tissue in the center of the wound. The undermining continues to contract. 03/04/2022: The wound continues to contract and look better. He still has heaped up hypertrophic granulation tissue near the orifice. No odor was coming from the wound today. He is awaiting [place] delivery. 03/18/2022: 2-week follow-up. [facility] topical antibiotic has been initiated. The chemical cauterization of the hypertrophic granulation tissue was quite successful and the surface is much flatter today. He has heaped up senescent skin around the perimeter. His home health providers have figured out a way to keep the wound VAC from losing suction by bolstering with  DuoDERM. Overall there has been significant improvement since our last visit. 04/01/2022: 2-week follow-up. The  wound continues to contract. Once again, there is heaped up senescent skin around the perimeter. He has a little bit of skin breakdown in the distribution of the adhesive drape from the wound VAC. No odor or purulent drainage. No concern for infection. 04/15/2022: 2-week follow-up. He has developed a fairly substantial rash from the drape adhesive for his wound VAC. The periwound has a lot of heaped up epiboly at the margins. The tissue in the wound bed is a little bit pale but there is no odor or purulent drainage. 04/22/2022: Last week we discontinued the VAC. Today, the skin around his wound is in significantly improved condition. His rash is resolving. He does have some heaped up senescent skin around the wound margin and a layer of slough on the wound surface, but there is no concern for active infection. 04/29/2022: The wound continues to contract. The surface is clean. He continues to build up senescent skin around the wound edges that subsequently get a little bit macerated. No concern for infection. 05/06/2022: The wound is smaller again today in all dimensions. The surface is clean. He has his usual accumulation of macerated senescent skin around the wound edges. 05/13/2022: Continued wound contracture. The undermining has decreased quite a bit. Clean surface. Senescent skin heaped up around the wound edges, as per usual. 05/22/2022: The macerated senescent skin has accumulated once again. The wound dimensions are slightly smaller and the surface is clean. He has been having difficulty keeping the packing in the wound due to the large foam border dressing that he prefers. 06/03/2022: The wound is smaller and shallower with less undermining. He has built up macerated senescent skin around the margins, as per usual. He and his friend have figured out a way to make sure that his packing stays in the wound with his [place]. 06/12/2022: The wound is a little bit shallower again today. He still has  accumulated senescent periwound skin, but otherwise the wound is clean. He will be undergoing bilateral recurrent inguinal hernia repair and umbilical hernia repair next week. 10/20 sacral pressure ulcer. using [facility] and backing wet to dry. Has developed a rash sur rounding the wound 07/03/2022: Continued contracture of the wound. It is very clean and there is no odor or purulent drainage. Continued buildup of senescent skin around the margin. 07/13/2022: There is less undermining present today. The orifice continues to contract. He continues to accumulate senescent skin around the perimeter. The rash around the wound has improved. 07/27/2022: The wound is smaller again today and the undermining is closed then. There is senescent skin accumulation, as usualTrigger Koskie, Kani ([id number]) [id number] Page 6 of 9 08/07/2022: The wound continues to contract but has its usual accumulation of periwound senescent skin. Unfortunately, the periwound skin looks like it has gotten more moist. It is red and angry and the patient says it is more painful. 08/14/2022: The wound is smaller again this week. His periwound skin is in much better condition. There is still senescent skin around the wound margins and a bit of slough on the surface. 12/18; this is a lower sacral pressure ulcer. We have been using silver alginate for the last 2 weeks. This is longstanding and refractory. At 1 point I think had exposed bone he no longer has this now. He tells me that he has had problems with his right lower leg stump and he is going to see hangers she  is therefore not walking but using his wheelchair although he says he is in his wheelchair "only 10% of the time]. Otherwise he claims to be rigorous with offloading this. 09/08/2022: The wound is considerably smaller than the last time I saw it. He has, as usual, the senescent skin accumulation around the wound margin. The undermining and depth, as  well as the overall dimensions have come in significantly. 09/21/2022: The wound dimensions are roughly the same, no significant accumulation of periwound senescent skin. There is some reaccumulation of hypertrophic granulation tissue on the wound bed, however. He is spending a bit more time in his wheelchair due to issues with his prosthetics, but he is hopeful that this will be resolved within the next week or so. 10/05/2022: The undermined portion of his wound has come in quite a bit. They are having difficulty getting the silver alginate to stay in place due to the decrease in wound size. He has accumulated senescent skin around the wound margins. 10/19/2022: The wound is down to just a couple of millimeters. Patient History Information obtained from Patient, Caregiver, Chart. Family History Cancer - Father, Heart Disease - Father,Paternal Grandparents, Hypertension - Father, No family history of Diabetes, Hereditary Spherocytosis, Kidney Disease, Lung Disease, Seizures, Stroke, Thyroid Problems, Tuberculosis. Social History Never smoker, Marital Status - Divorced, Alcohol Use - Never, Drug Use - No History, Caffeine Use - Moderate - coffee. Medical History Eyes Denies history of Cataracts, Glaucoma, Optic Neuritis Ear/Nose/Mouth/Throat Denies history of Chronic sinus problems/congestion, Middle ear problems Cardiovascular Patient has history of Congestive Heart Failure, Hypertension Endocrine Patient has history of Type II Diabetes Denies history of Type I Diabetes Genitourinary Denies history of End Stage Renal Disease Integumentary (Skin) Denies history of History of Burn Musculoskeletal Patient has history of Osteomyelitis - S1 and coccyx Oncologic Denies history of Received Chemotherapy, Received Radiation Psychiatric Patient has history of Confinement Anxiety Denies history of Anorexia/bulimia Hospitalization/Surgery History - bil BKA. Medical A Surgical History  Notes nd Respiratory pulmonary nodules Genitourinary renal mass, urinary hesitancy Musculoskeletal discitis of thoracic region Objective Constitutional Hypertensive, asymptomatic. no acute distress. Vitals Time Taken: 1:41 PM, Height: 75 in, Weight: 220 lbs, BMI: 27.5, Temperature: 97.5 F, Pulse: 72 bpm, Respiratory Rate: 18 breaths/min, Blood Pressure: 157/96 mmHg, Capillary Blood Glucose: 135 mg/dl. General Notes: glucose per pt report this am Respiratory Normal work of breathing on room air. KWAMI, WINKELMAN ([id number]) [id number] Page 7 of 9 General Notes: 10/19/2022: The wound is down to just a couple of millimeters. Integumentary (Hair, Skin) Wound #1 status is Open. Original cause of wound was Pressure Injury. The date acquired was: 07/29/2021. The wound has been in treatment 40 weeks. The wound is located on the Sacrum. The wound measures 0.7cm length x 0.3cm width x 0.7cm depth; 0.165cm^2 area and 0.115cm^3 volume. There is Fat Layer (Subcutaneous Tissue) exposed. There is no tunneling noted, however, there is undermining starting at 1:00 and ending at 4:00 with a maximum distance of 0.6cm. There is a medium amount of serous drainage noted. The wound margin is epibole. There is large (67-100%) pink granulation within the wound bed. There is no necrotic tissue within the wound bed. The periwound skin appearance had no abnormalities noted for texture. The periwound skin appearance had no abnormalities noted for color. The periwound skin appearance did not exhibit: Dry/Scaly, Maceration. Periwound temperature was noted as No Abnormality. Assessment Active Problems ICD-10 Pressure ulcer of sacral region, stage 4 Acquired absence of left leg below knee Acquired absence of  right leg below knee Plan Follow-up Appointments: Return Appointment in 2 weeks. - Dr. Celine Ahr - room 1 Monday 2/26 @ 1:15 pm Anesthetic: Wound #1 Sacrum: (In clinic) Topical  Lidocaine 4% applied to wound bed Off-Loading: Gel mattress overlay (Group 1) Turn and reposition every 2 hours WOUND #1: - Sacrum Wound Laterality: Cleanser: Soap and Water 3 x Per Week/30 Days Discharge Instructions: May shower and wash wound with dial antibacterial soap and water prior to dressing change. Cleanser: Wound Cleanser 3 x Per Week/30 Days Discharge Instructions: Cleanse the wound with wound cleanser prior to applying a clean dressing using gauze sponges, not tissue or cotton balls. Peri-Wound Care: Skin Prep 3 x Per Week/30 Days Discharge Instructions: Use skin prep as directed Peri-Wound Care: Zinc Oxide Ointment 30g tube 3 x Per Week/30 Days Discharge Instructions: Apply Zinc Oxide to macerated periwound with each dressing change as needed Prim Dressing: Promogran Prisma Matrix, 4.34 (sq in) (silver collagen) 3 x Per Week/30 Days ary Discharge Instructions: Moisten collagen with saline and place in base or wound Secondary Dressing: Woven Gauze Sponges 2x2 in 3 x Per Week/30 Days Discharge Instructions: Moisten with saline and pack into wound lightly to hold collagen in place Secondary Dressing: Zetuvit Plus Silicone Border Sacrum Dressing, Sm, 7x7 (in/in) (Generic) 3 x Per Week/30 Days Discharge Instructions: Apply silicone border over primary dressing as directed. Secured With: 31M Medipore H Soft Cloth Surgical T ape, 4 x 10 (in/yd) (Generic) 3 x Per Week/30 Days Discharge Instructions: Secure with tape as directed. Add-Ons: Gloves, Large 3 x Per Week/30 Days 10/19/2022: The wound is down to just a couple of millimeters. No debridement was necessary today. We will continue to pack the site with Prisma silver collagen and use some saline-moistened gauze to hold it in place underneath the foam border dressing. Follow-up in 2 weeks, at which time I anticipate the wound may be completely closed. Electronic Signature(s) Signed: 10/19/2022 2:24:11 PM By: [name] MD  FACS Entered By: [name] on 10/19/2022 14:24:11 -------------------------------------------------------------------------------- HxROS Details Patient Name: Date of Service: [name], [name] 10/19/2022 1:15 PM Medical Record Number: [id number] Patient Account Number: [id number] Date of Birth/Sex: Treating RN: [birth year] (57 y.o. M) Primary Care Provider: Berniece Pap Other Clinician: [name] ([id number]) [id number] Page 8 of 9 Referring Provider: Treating Provider/Extender: Lannie Fields in Treatment: 21 Information Obtained From Patient Caregiver Chart Eyes Medical History: Negative for: Cataracts; Glaucoma; Optic Neuritis Ear/Nose/Mouth/Throat Medical History: Negative for: Chronic sinus problems/congestion; Middle ear problems Respiratory Medical History: Past Medical History Notes: pulmonary nodules Cardiovascular Medical History: Positive for: Congestive Heart Failure; Hypertension Endocrine Medical History: Positive for: Type II Diabetes Negative for: Type I Diabetes Time with diabetes: since 2004 Treated with: Insulin Blood sugar tested every day: Yes Tested : 4 times per day Genitourinary Medical History: Negative for: End Stage Renal Disease Past Medical History Notes: renal mass, urinary hesitancy Integumentary (Skin) Medical History: Negative for: History of Burn Musculoskeletal Medical History: Positive for: Osteomyelitis - S1 and coccyx Past Medical History Notes: discitis of thoracic region Oncologic Medical History: Negative for: Received Chemotherapy; Received Radiation Psychiatric Medical History: Positive for: Confinement Anxiety Negative for: Anorexia/bulimia Immunizations Pneumococcal Vaccine: Received Pneumococcal Vaccination: No Implantable Devices No devices added Hospitalization / Surgery History Type of Hospitalization/Surgery bil BKA JURELL, KNICELY  ([id number]) [phone] Page 9 of 9 Family and Social History Cancer: Yes - Father; Diabetes: No; Heart Disease: Yes - Father,Paternal Grandparents; Hereditary Spherocytosis: No; Hypertension: Yes - Father; Kidney Disease:  No; Lung Disease: No; Seizures: No; Stroke: No; Thyroid Problems: No; Tuberculosis: No; Never smoker; Marital Status - Divorced; Alcohol Use: Never; Drug Use: No History; Caffeine Use: Moderate - coffee; Financial Concerns: No; Food, Clothing or Shelter Needs: No; Support System Lacking: No; Transportation Concerns: Yes - hurts to transfer Electronic Signature(s) Signed: 10/19/2022 4:34:30 PM By: [name] MD FACS Entered By: [name] on 10/19/2022 14:22:57 -------------------------------------------------------------------------------- SuperBill Details Patient Name: Date of Service: [name], [name] 10/19/2022 Medical Record Number: [id number] Patient Account Number: [id number] Date of Birth/Sex: Treating RN: 20-Mar-1966 (57 y.o. [name] Primary Care Provider: Berniece Pap Other Clinician: Referring Provider: Treating Provider/Extender: Lannie Fields in Treatment: 40 Diagnosis Coding ICD-10 Codes Code Description L89.154 Pressure ulcer of sacral region, stage 4 Z89.512 Acquired absence of left leg below knee Z89.511 Acquired absence of right leg below knee Facility Procedures : CPT4 Code: [id number] Description: 99213 - WOUND CARE VISIT-LEV 3 EST PT Modifier: Quantity: 1 Physician Procedures : CPT4 Code Description Modifier [id number] - WC PHYS LEVEL 4 - EST PT ICD-10 Diagnosis Description L89.154 Pressure ulcer of sacral region, stage 4 Z89.512 Acquired absence of left leg below knee Z89.511 Acquired absence of right leg below knee Quantity: 1 Electronic Signature(s) Signed: 10/19/2022 2:24:46 PM By: [name] MD FACS Entered By: [name] on 10/19/2022 14:24:46

## 2022-10-20 NOTE — Progress Notes (Signed)
[name] ([id number]) [id number] Page 1 of 9 Visit Report for 10/19/2022 Arrival Information Details Patient Name: Date of Service: [name], [name] 10/19/2022 1:15 PM Medical Record Number: [id number] Patient Account Number: [id number] Date of Birth/Sex: Treating RN: 09-Feb-1966 (57 y.o. [name] Primary Care [name]: [name] Other Clinician: Referring [name]: Treating [name]/Extender: [name] in Treatment: 46 Visit Information History Since Last Visit Added or deleted any medications: No Patient Arrived: Wheel Chair Any new allergies or adverse reactions: No Arrival Time: 13:29 Had a fall or experienced change in No Accompanied By: friend activities of daily living that may affect Transfer Assistance: None risk of falls: Patient Identification Verified: Yes Signs or symptoms of abuse/neglect since last visito No Secondary Verification Process Completed: Yes Hospitalized since last visit: No Patient Requires Transmission-Based Precautions: No Implantable device outside of the clinic excluding No Patient Has Alerts: No cellular tissue based products placed in the center since last visit: Has Dressing in Place as Prescribed: Yes Pain Present Now: No Electronic Signature(s) Signed: 10/19/2022 5:22:41 PM By: Baruch Gouty RN, BSN Entered By: Baruch Gouty on 10/19/2022 13:41:23 -------------------------------------------------------------------------------- Clinic Level of Care Assessment Details Patient Name: Date of Service: [name], [name] 10/19/2022 1:15 PM Medical Record Number: [id number] Patient Account Number: [id number] Date of Birth/Sex: Treating RN: 11/02/1965 (57 y.o. [name] Primary Care [name]: [name] Other Clinician: Referring [name]: Treating [name]/Extender: [name] in Treatment: 40 Clinic Level of Care Assessment  Items TOOL 4 Quantity Score []$  - 0 Use when only an EandM is performed on FOLLOW-UP visit ASSESSMENTS - Nursing Assessment / Reassessment X- 1 10 Reassessment of Co-morbidities (includes updates in patient status) X- 1 5 Reassessment of Adherence to Treatment Plan ASSESSMENTS - Wound and Skin A ssessment / Reassessment X - Simple Wound Assessment / Reassessment - one wound 1 5 []$  - 0 Complex Wound Assessment / Reassessment - multiple wounds []$  - 0 Dermatologic / Skin Assessment (not related to wound area) ASSESSMENTS - Focused Assessment []$  - 0 Circumferential Edema Measurements - multi extremities []$  - 0 Nutritional Assessment / Counseling / Intervention [name] ([id number] Page 2 of 9 []$  - 0 Lower Extremity Assessment (monofilament, tuning fork, pulses) []$  - 0 Peripheral Arterial Disease Assessment (using hand held doppler) ASSESSMENTS - Ostomy and/or Continence Assessment and Care []$  - 0 Incontinence Assessment and Management []$  - 0 Ostomy Care Assessment and Management (repouching, etc.) PROCESS - Coordination of Care X - Simple Patient / Family Education for ongoing care 1 15 []$  - 0 Complex (extensive) Patient / Family Education for ongoing care X- 1 10 Staff obtains Programmer, systems, Records, T Results / Process Orders est []$  - 0 Staff telephones HHA, Nursing Homes / Clarify orders / etc []$  - 0 Routine Transfer to another Facility (non-emergent condition) []$  - 0 Routine Hospital Admission (non-emergent condition) []$  - 0 New Admissions / Biomedical engineer / Ordering NPWT Apligraf, etc. , []$  - 0 Emergency Hospital Admission (emergent condition) X- 1 10 Simple Discharge Coordination []$  - 0 Complex (extensive) Discharge Coordination PROCESS - Special Needs []$  - 0 Pediatric / Minor Patient Management []$  - 0 Isolation Patient Management []$  - 0 Hearing / Language / Visual special needs []$  - 0 Assessment of  Community assistance (transportation, D/C planning, etc.) []$  - 0 Additional assistance / Altered mentation []$  - 0 Support Surface(s) Assessment (bed, cushion, seat, etc.) INTERVENTIONS - Wound Cleansing / Measurement X - Simple Wound Cleansing -  one wound 1 5 []$  - 0 Complex Wound Cleansing - multiple wounds X- 1 5 Wound Imaging (photographs - any number of wounds) []$  - 0 Wound Tracing (instead of photographs) X- 1 5 Simple Wound Measurement - one wound []$  - 0 Complex Wound Measurement - multiple wounds INTERVENTIONS - Wound Dressings X - Small Wound Dressing one or multiple wounds 1 10 []$  - 0 Medium Wound Dressing one or multiple wounds []$  - 0 Large Wound Dressing one or multiple wounds []$  - 0 Application of Medications - topical []$  - 0 Application of Medications - injection INTERVENTIONS - Miscellaneous []$  - 0 External ear exam []$  - 0 Specimen Collection (cultures, biopsies, blood, body fluids, etc.) []$  - 0 Specimen(s) / Culture(s) sent or taken to Lab for analysis []$  - 0 Patient Transfer (multiple staff / Civil Service fast streamer / Similar devices) []$  - 0 Simple Staple / Suture removal (25 or less) []$  - 0 Complex Staple / Suture removal (26 or more) []$  - 0 Hypo / Hyperglycemic Management (close monitor of Blood Glucose) [name] ([id number] Page 3 of 9 []$  - 0 Ankle / Brachial Index (ABI) - do not check if billed separately X- 1 5 Vital Signs Has the patient been seen at the hospital within the last three years: Yes Total Score: 85 Level Of Care: New/Established - Level 3 Electronic Signature(s) Signed: 10/19/2022 5:22:41 PM By: Baruch Gouty RN, BSN Entered By: Baruch Gouty on 10/19/2022 14:15:17 -------------------------------------------------------------------------------- Encounter Discharge Information Details Patient Name: Date of Service: [name], [name] 10/19/2022 1:15 PM Medical Record Number: [id number] Patient  Account Number: [id number] Date of Birth/Sex: Treating RN: 07-18-1966 (57 y.o. [name] Primary Care [name]: [name] Other Clinician: Referring [name]: Treating [name]/Extender: [name] in Treatment: 29 Encounter Discharge Information Items Discharge Condition: Stable Ambulatory Status: Wheelchair Discharge Destination: Home Transportation: Private Auto Accompanied By: friend Schedule Follow-up Appointment: Yes Clinical Summary of Care: Patient Declined Electronic Signature(s) Signed: 10/19/2022 5:22:41 PM By: Baruch Gouty RN, BSN Entered By: Baruch Gouty on 10/19/2022 14:16:34 -------------------------------------------------------------------------------- Lower Extremity Assessment Details Patient Name: Date of Service: [place], [name] 10/19/2022 1:15 PM Medical Record Number: [id number] Patient Account Number: [id number] Date of Birth/Sex: Treating RN: Jul 15, 1966 (57 y.o. [name] Primary Care Charo Philipp: [name] Other Clinician: Referring Deaundre Allston: Treating Cornell Bourbon/Extender: [name] in Treatment: 40 Electronic Signature(s) Signed: 10/19/2022 5:22:41 PM By: Baruch Gouty RN, BSN Entered By: Baruch Gouty on 10/19/2022 13:42:12 -------------------------------------------------------------------------------- Multi Wound Chart Details Patient Name: Date of Service: [name], [name] 10/19/2022 1:15 PM Medical Record Number: [id number] Patient Account Number: [id number] AB, [name] ([id number]) [id number] Page 4 of 9 Date of Birth/Sex: Treating RN: 05-16-1966 (57 y.o. M) Primary Care Illias Pantano: Other Clinician: Berniece [name] Referring [name]: Treating Jasmeet Gehl/Extender: [name] in Treatment: 40 Vital Signs Height(in): 75 Capillary Blood Glucose(mg/dl): 135 Weight(lbs): 220 Pulse(bpm): 23 Body Mass  Index(BMI): 27.5 Blood Pressure(mmHg): 157/96 Temperature(F): 97.5 Respiratory Rate(breaths/min): 18 [1:Photos:] [N/A:N/A] Sacrum N/A N/A Wound Location: Pressure Injury N/A N/A Wounding Event: Pressure Ulcer N/A N/A Primary Etiology: Congestive Heart Failure, N/A N/A Comorbid History: Hypertension, Type II Diabetes, Osteomyelitis, Confinement Anxiety 07/29/2021 N/A N/A Date Acquired: 40 N/A N/A Weeks of Treatment: Open N/A N/A Wound Status: No N/A N/A Wound Recurrence: 0.7x0.3x0.7 N/A N/A Measurements L x W x D (cm) 0.165 N/A N/A A (cm) : rea 0.115 N/A N/A Volume (cm) : 97.20% N/A N/A % Reduction in A rea: 98.20% N/A  N/A % Reduction in Volume: 1 Starting Position 1 (o'clock): 4 Ending Position 1 (o'clock): 0.6 Maximum Distance 1 (cm): Yes N/A N/A Undermining: Category/Stage IV N/A N/A Classification: Medium N/A N/A Exudate A mount: Serous N/A N/A Exudate Type: amber N/A N/A Exudate Color: Epibole N/A N/A Wound Margin: Large (67-100%) N/A N/A Granulation A mount: Pink N/A N/A Granulation Quality: None Present (0%) N/A N/A Necrotic A mount: Fat Layer (Subcutaneous Tissue): Yes N/A N/A Exposed Structures: Fascia: No Tendon: No Muscle: No Joint: No Bone: No Small (1-33%) N/A N/A Epithelialization: Excoriation: No N/A N/A Periwound Skin Texture: Callus: No Rash: No Maceration: No N/A N/A Periwound Skin Moisture: Dry/Scaly: No No Abnormalities Noted N/A N/A Periwound Skin Color: No Abnormality N/A N/A Temperature: Treatment Notes Wound #1 (Sacrum) Cleanser Soap and Water Discharge Instruction: May shower and wash wound with dial antibacterial soap and water prior to dressing change. Wound Cleanser Discharge Instruction: Cleanse the wound with wound cleanser prior to applying a clean dressing using gauze sponges, not tissue or cotton balls. Peri-Wound Care Skin Prep Discharge Instruction: Use skin prep as directed [name], [name]  ([id number]) [id number] Page 5 of 9 Zinc Oxide Ointment 30g tube Discharge Instruction: Apply Zinc Oxide to macerated periwound with each dressing change as needed Topical Primary Dressing Promogran Prisma Matrix, 4.34 (sq in) (silver collagen) Discharge Instruction: Moisten collagen with saline and place in base or wound Secondary Dressing Woven Gauze Sponges 2x2 in Discharge Instruction: Moisten with saline and pack into wound lightly to hold collagen in place Zetuvit Plus Silicone Border Sacrum Dressing, Sm, 7x7 (in/in) Discharge Instruction: Apply silicone border over primary dressing as directed. Secured With 62M Medipore H Soft Cloth Surgical T ape, 4 x 10 (in/yd) Discharge Instruction: Secure with tape as directed. Compression Wrap Compression Stockings Add-Ons Gloves, Large Electronic Signature(s) Signed: 10/19/2022 2:22:09 PM By: [name] MD FACS Entered By: [name] on 10/19/2022 14:22:09 -------------------------------------------------------------------------------- Multi-Disciplinary Care Plan Details Patient Name: Date of Service: [facility], [name] 10/19/2022 1:15 PM Medical Record Number: [id number] Patient Account Number: [id number] Date of Birth/Sex: Treating RN: 1966/07/08 (57 y.o. [name] Primary Care Wonder Donaway: [name] Other Clinician: Referring [name]: Treating [name]/Extender: [name] in Treatment: [place] reviewed with physician Active Inactive Nutrition Nursing Diagnoses: Impaired glucose control: actual or potential Potential for alteratiion in Nutrition/Potential for imbalanced nutrition Goals: Patient/caregiver will maintain therapeutic glucose control Date Initiated: 01/08/2022 Target Resolution Date: 11/02/2022 Goal Status: Active Interventions: Assess HgA1c results as ordered upon admission and as needed Assess patient nutrition upon  admission and as needed per policy Provide education on elevated blood sugars and impact on wound healing Treatment Activities: Dietary management education, guidance and counseling : 01/08/2022 Patient referred to Primary Care Physician for further nutritional evaluation : 01/08/2022 Notes: KYSIN, CUBITT ([id number]) [phone].pdf Page 6 of 9 Pressure Nursing Diagnoses: Knowledge deficit related to causes and risk factors for pressure ulcer development Knowledge deficit related to management of pressures ulcers Potential for impaired tissue integrity related to pressure, friction, moisture, and shear Goals: Patient/caregiver will verbalize understanding of pressure ulcer management Date Initiated: 01/08/2022 Target Resolution Date: 11/02/2022 Goal Status: Active Interventions: Assess: immobility, friction, shearing, incontinence upon admission and as needed Assess offloading mechanisms upon admission and as needed Assess potential for pressure ulcer upon admission and as needed Notes: Wound/Skin Impairment Nursing Diagnoses: Impaired tissue integrity Knowledge deficit related to ulceration/compromised skin integrity Goals: Patient/caregiver will verbalize understanding of skin care regimen Date Initiated: 01/08/2022 Target Resolution Date:  11/02/2022 Goal Status: Active Ulcer/skin breakdown will have a volume reduction of 30% by week 4 Date Initiated: 01/08/2022 Date Inactivated: 02/18/2022 Target Resolution Date: 02/05/2022 Goal Status: Unmet Unmet Reason: VAC leaking Ulcer/skin breakdown will have a volume reduction of 50% by week 8 Date Initiated: 02/18/2022 Date Inactivated: 03/04/2022 Target Resolution Date: 03/05/2022 Goal Status: Unmet Unmet Reason: infection Ulcer/skin breakdown will have a volume reduction of 80% by week 12 Date Initiated: 03/04/2022 Date Inactivated: 04/01/2022 Target Resolution Date: 04/02/2022 Goal Status: Unmet Unmet Reason: too much  moisture Interventions: Assess patient/caregiver ability to obtain necessary supplies Assess patient/caregiver ability to perform ulcer/skin care regimen upon admission and as needed Assess ulceration(s) every visit Provide education on ulcer and skin care Treatment Activities: Skin care regimen initiated : 01/08/2022 Topical wound management initiated : 01/08/2022 Notes: Electronic Signature(s) Signed: 10/19/2022 5:22:41 PM By: Baruch Gouty RN, BSN Entered By: Baruch Gouty on 10/19/2022 13:51:15 -------------------------------------------------------------------------------- Pain Assessment Details Patient Name: Date of Service: [name], [name] 10/19/2022 1:15 PM Medical Record Number: [id number] Patient Account Number: [id number] Date of Birth/Sex: Treating RN: 25-Dec-1965 (57 y.o. [name] Primary Care Lacole Komorowski: [name] Other Clinician: Referring Cotina Freedman: Treating Lillyana Majette/Extender: [name] in Treatment: 40 Active Problems Location of Pain Severity and Description of Pain [name], [name] ([id number]) [id number] Page 7 of 9 Patient Has Paino No Site Locations Rate the pain. Current Pain Level: 0 Pain Management and Medication Current Pain Management: Electronic Signature(s) Signed: 10/19/2022 5:22:41 PM By: Baruch Gouty RN, BSN Entered By: Baruch Gouty on 10/19/2022 13:42:04 -------------------------------------------------------------------------------- Patient/Caregiver Education Details Patient Name: Date of Service: [name], [name] 2/12/2024andnbsp1:15 PM Medical Record Number: [id number] Patient Account Number: [id number] Date of Birth/Gender: Treating RN: 03-10-1966 (57 y.o. [name] Primary Care Physician: [name] Other Clinician: Referring Physician: Treating Physician/Extender: [name] in Treatment: 10 Education Assessment Education  Provided To: Patient Education Topics Provided Pressure: Methods: Explain/Verbal Responses: Reinforcements needed, State content correctly Wound/Skin Impairment: Methods: Explain/Verbal Responses: Reinforcements needed, State content correctly Electronic Signature(s) Signed: 10/19/2022 5:22:41 PM By: Baruch Gouty RN, BSN Entered By: Baruch Gouty on 10/19/2022 13:51:44 [name] ([id number] Page 8 of 9 -------------------------------------------------------------------------------- Wound Assessment Details Patient Name: Date of Service: [name] 10/19/2022 1:15 PM Medical Record Number: [id number] Patient Account Number: [id number] Date of Birth/Sex: Treating RN: 22-Nov-1965 (57 y.o. [name] Primary Care Sharissa Brierley: [name] Other Clinician: Referring Elier Zellars: Treating Bronc Brosseau/Extender: [name] in Treatment: 40 Wound Status Wound Number: 1 Primary Pressure Ulcer Etiology: Wound Location: Sacrum Wound Open Wounding Event: Pressure Injury Status: Date Acquired: 07/29/2021 Comorbid Congestive Heart Failure, Hypertension, Type II Diabetes, Weeks Of Treatment: 40 History: Osteomyelitis, Confinement Anxiety Clustered Wound: No Photos Wound Measurements Length: (cm) 0.7 Width: (cm) 0.3 Depth: (cm) 0.7 Area: (cm) 0.165 Volume: (cm) 0.115 % Reduction in Area: 97.2% % Reduction in Volume: 98.2% Epithelialization: Small (1-33%) Tunneling: No Undermining: Yes Starting Position (o'clock): 1 Ending Position (o'clock): 4 Maximum Distance: (cm) 0.6 Wound Description Classification: Category/Stage IV Wound Margin: Epibole Exudate Amount: Medium Exudate Type: Serous Exudate Color: amber Foul Odor After Cleansing: No Slough/Fibrino No Wound Bed Granulation Amount: Large (67-100%) Exposed Structure Granulation Quality: Pink Fascia Exposed: No Necrotic Amount: None Present  (0%) Fat Layer (Subcutaneous Tissue) Exposed: Yes Tendon Exposed: No Muscle Exposed: No Joint Exposed: No Bone Exposed: No Periwound Skin Texture Texture Color No Abnormalities Noted: Yes No Abnormalities Noted: Yes Moisture Temperature / Pain No Abnormalities Noted: No  Temperature: No Abnormality Dry / Scaly: No Maceration: No Treatment Notes Wound #1 (Sacrum) Cleanser Soap and Water Discharge Instruction: May shower and wash wound with dial antibacterial soap and water prior to dressing change. DAYMOND, ELLSWORTH ([id number]) [id number] Page 9 of 9 Wound Cleanser Discharge Instruction: Cleanse the wound with wound cleanser prior to applying a clean dressing using gauze sponges, not tissue or cotton balls. Peri-Wound Care Skin Prep Discharge Instruction: Use skin prep as directed Zinc Oxide Ointment 30g tube Discharge Instruction: Apply Zinc Oxide to macerated periwound with each dressing change as needed Topical Primary Dressing Promogran Prisma Matrix, 4.34 (sq in) (silver collagen) Discharge Instruction: Moisten collagen with saline and place in base or wound Secondary Dressing Woven Gauze Sponges 2x2 in Discharge Instruction: Moisten with saline and pack into wound lightly to hold collagen in place Zetuvit Plus Silicone Border Sacrum Dressing, Sm, 7x7 (in/in) Discharge Instruction: Apply silicone border over primary dressing as directed. Secured With 83M Medipore H Soft Cloth Surgical T ape, 4 x 10 (in/yd) Discharge Instruction: Secure with tape as directed. Compression Wrap Compression Stockings Add-Ons Gloves, Art gallery manager) Signed: 10/19/2022 5:22:41 PM By: Baruch Gouty RN, BSN Entered By: Baruch Gouty on 10/19/2022 13:50:35 -------------------------------------------------------------------------------- Vitals Details Patient Name: Date of Service: [name], [name] 10/19/2022 1:15 PM Medical Record Number:  [id number] Patient Account Number: [id number] Date of Birth/Sex: Treating RN: [birth year] (57 y.o. [name] Primary Care Alanee Ting: [name] Other Clinician: Referring Kaulin Chaves: Treating Anis Cinelli/Extender: [name] in Treatment: 40 Vital Signs Time Taken: 13:41 Temperature (F): 97.5 Height (in): 75 Pulse (bpm): 72 Weight (lbs): 220 Respiratory Rate (breaths/min): 18 Body Mass Index (BMI): 27.5 Blood Pressure (mmHg): 157/96 Capillary Blood Glucose (mg/dl): 135 Reference Range: 80 - 120 mg / dl Notes glucose per pt report this am Electronic Signature(s) Signed: 10/19/2022 5:22:41 PM By: Baruch Gouty RN, BSN Entered By: Baruch Gouty on 10/19/2022 13:41:56

## 2022-11-02 ENCOUNTER — Encounter (HOSPITAL_BASED_OUTPATIENT_CLINIC_OR_DEPARTMENT_OTHER): Payer: Medicare HMO | Admitting: General Surgery

## 2022-11-02 DIAGNOSIS — L89154 Pressure ulcer of sacral region, stage 4: Secondary | ICD-10-CM | POA: Diagnosis not present

## 2022-11-03 DIAGNOSIS — L89154 Pressure ulcer of sacral region, stage 4: Secondary | ICD-10-CM | POA: Diagnosis not present

## 2022-11-03 NOTE — Progress Notes (Signed)
JAQUIS, DUPREE ([id number]) [id number] Page 1 of 7 Visit Report for 11/02/2022 Arrival Information Details Patient Name: Date of Service: [place], [name] 11/02/2022 1:15 PM Medical Record Number: [id number] Patient Account Number: [id number] Date of Birth/Sex: Treating RN: Jun 07, 1966 (57 y.o. [name] Primary Care Rosie Golson: Berniece Pap Other Clinician: Referring Kytzia Gienger: Treating Venora Kautzman/Extender: Lannie Fields in Treatment: 42 Visit Information History Since Last Visit Added or deleted any medications: No Patient Arrived: Wheel Chair Any new allergies or adverse reactions: No Arrival Time: 13:39 Had a fall or experienced change in No Accompanied By: friend activities of daily living that may affect Transfer Assistance: None risk of falls: Patient Identification Verified: Yes Signs or symptoms of abuse/neglect since last visito No Secondary Verification Process Completed: Yes Hospitalized since last visit: No Patient Requires Transmission-Based Precautions: No Implantable device outside of the clinic excluding No Patient Has Alerts: No cellular tissue based products placed in the center since last visit: Has Dressing in Place as Prescribed: Yes Pain Present Now: No Electronic Signature(s) Signed: 11/02/2022 5:30:43 PM By: Baruch Gouty RN, BSN Entered By: Baruch Gouty on 11/02/2022 13:48:29 -------------------------------------------------------------------------------- Encounter Discharge Information Details Patient Name: Date of Service: [name], [name] 11/02/2022 1:15 PM Medical Record Number: [id number] Patient Account Number: [id number] Date of Birth/Sex: Treating RN: 02/11/1966 (57 y.o. [name] Primary Care Rubby Barbary: Berniece Pap Other Clinician: Referring Carlyle Mcelrath: Treating Allix Blomquist/Extender: Lannie Fields in Treatment: 42 Encounter Discharge Information  Items Discharge Condition: Stable Ambulatory Status: Wheelchair Discharge Destination: Home Transportation: Private Auto Accompanied By: friend Schedule Follow-up Appointment: Yes Clinical Summary of Care: Patient Declined Electronic Signature(s) Signed: 11/02/2022 5:30:43 PM By: Baruch Gouty RN, BSN Entered By: Baruch Gouty on 11/02/2022 14:18:31 -------------------------------------------------------------------------------- Lower Extremity Assessment Details Patient Name: Date of Service: [place], [name] 11/02/2022 1:15 PM Medical Record Number: [id number] Patient Account Number: [id number] Date of Birth/Sex: Treating RN: [birth year] (57 y.o. [name] Primary Care Christe Tellez: Berniece Pap Other Clinician: Referring Danie Hannig: Treating Jajaira Ruis/Extender: Lannie Fields in Treatment: 42 Electronic Signature(s) Signed: 11/02/2022 5:30:43 PM By: Baruch Gouty RN, BSN [place], Herbie [place] ([id number]) [id number] Page 2 of 7 Entered By: Baruch Gouty on 11/02/2022 13:49:37 -------------------------------------------------------------------------------- Multi Wound Chart Details Patient Name: Date of Service: [place], [name] 11/02/2022 1:15 PM Medical Record Number: [id number] Patient Account Number: [id number] Date of Birth/Sex: Treating RN: 01/05/1966 (57 y.o. M) Primary Care Wilson Sample: Berniece Pap Other Clinician: Referring Brookelynn Hamor: Treating Oaklyn Jakubek/Extender: Lannie Fields in Treatment: 42 Vital Signs Height(in): 75 Capillary Blood Glucose(mg/dl): 115 Weight(lbs): 220 Pulse(bpm): 101 Body Mass Index(BMI): 27.5 Blood Pressure(mmHg): 119/75 Temperature(F): 97.7 Respiratory Rate(breaths/min): 20 [1:Photos:] [N/A:N/A] Sacrum N/A N/A Wound Location: Pressure Injury N/A N/A Wounding Event: Pressure Ulcer N/A N/A Primary Etiology: Congestive Heart Failure, N/A N/A Comorbid  History: Hypertension, Type II Diabetes, Osteomyelitis, Confinement Anxiety 07/29/2021 N/A N/A Date Acquired: 53 N/A N/A Weeks of Treatment: Open N/A N/A Wound Status: No N/A N/A Wound Recurrence: 0.5x0.2x0.8 N/A N/A Measurements L x W x D (cm) 0.079 N/A N/A A (cm) : rea 0.063 N/A N/A Volume (cm) : 98.70% N/A N/A % Reduction in A rea: 99.00% N/A N/A % Reduction in Volume: Category/Stage IV N/A N/A Classification: Medium N/A N/A Exudate A mount: Serous N/A N/A Exudate Type: amber N/A N/A Exudate Color: Epibole N/A N/A Wound Margin: Large (67-100%) N/A N/A Granulation A mount: Red N/A N/A Granulation Quality: None Present (0%) N/A N/A Necrotic A mount: Fat Layer (Subcutaneous Tissue): Yes  N/A N/A Exposed Structures: Fascia: No Tendon: No Muscle: No Joint: No Bone: No Small (1-33%) N/A N/A Epithelialization: Excoriation: No N/A N/A Periwound Skin Texture: Callus: No Rash: No Maceration: No N/A N/A Periwound Skin Moisture: Dry/Scaly: No No Abnormalities Noted N/A N/A Periwound Skin Color: No Abnormality N/A N/A Temperature: Treatment Notes Wound #1 (Sacrum) Cleanser Soap and Water Discharge Instruction: May shower and wash wound with dial antibacterial soap and water prior to dressing change. Wound Cleanser Discharge Instruction: Cleanse the wound with wound cleanser prior to applying a clean dressing using gauze sponges, not tissue or cotton balls. ELANTE, SIEFKE ([id number]) [id number] Page 3 of 7 Peri-Wound Care Skin Prep Discharge Instruction: Use skin prep as directed Zinc Oxide Ointment 30g tube Discharge Instruction: Apply Zinc Oxide to macerated periwound with each dressing change as needed Topical Primary Dressing Promogran Prisma Matrix, 4.34 (sq in) (silver collagen) Discharge Instruction: Moisten collagen with saline and place in base or wound Secondary Dressing Woven Gauze Sponges 2x2 in Discharge  Instruction: Moisten with saline and pack into wound lightly to hold collagen in place Zetuvit Plus Silicone Border Sacrum Dressing, Sm, 7x7 (in/in) Discharge Instruction: Apply silicone border over primary dressing as directed. Secured With 90M Medipore H Soft Cloth Surgical T ape, 4 x 10 (in/yd) Discharge Instruction: Secure with tape as directed. Compression Wrap Compression Stockings Add-Ons Gloves, Large Electronic Signature(s) Signed: 11/02/2022 2:20:54 PM By: [name] MD FACS Entered By: [name] on 11/02/2022 14:20:53 -------------------------------------------------------------------------------- Multi-Disciplinary Care Plan Details Patient Name: Date of Service: [facility], [name] 11/02/2022 1:15 PM Medical Record Number: [id number] Patient Account Number: [id number] Date of Birth/Sex: Treating RN: 1965-09-16 (57 y.o. [name] Primary Care Emmanuela Ghazi: Berniece Pap Other Clinician: Referring Tyr Franca: Treating Adarrius Graeff/Extender: Lannie Fields in Treatment: [place] reviewed with physician Active Inactive Nutrition Nursing Diagnoses: Impaired glucose control: actual or potential Potential for alteratiion in Nutrition/Potential for imbalanced nutrition Goals: Patient/caregiver will maintain therapeutic glucose control Date Initiated: 01/08/2022 Target Resolution Date: 11/30/2022 Goal Status: Active Interventions: Assess HgA1c results as ordered upon admission and as needed Assess patient nutrition upon admission and as needed per policy Provide education on elevated blood sugars and impact on wound healing Treatment Activities: Dietary management education, guidance and counseling : 01/08/2022 Patient referred to Primary Care Physician for further nutritional evaluation : 01/08/2022 Notes: PANTELIS, CASTRILLO ([id number]) [id number].pdf Page 4 of 7 Pressure Nursing Diagnoses: Knowledge  deficit related to causes and risk factors for pressure ulcer development Knowledge deficit related to management of pressures ulcers Potential for impaired tissue integrity related to pressure, friction, moisture, and shear Goals: Patient/caregiver will verbalize understanding of pressure ulcer management Date Initiated: 01/08/2022 Target Resolution Date: 11/30/2022 Goal Status: Active Interventions: Assess: immobility, friction, shearing, incontinence upon admission and as needed Assess offloading mechanisms upon admission and as needed Assess potential for pressure ulcer upon admission and as needed Notes: Wound/Skin Impairment Nursing Diagnoses: Impaired tissue integrity Knowledge deficit related to ulceration/compromised skin integrity Goals: Patient/caregiver will verbalize understanding of skin care regimen Date Initiated: 01/08/2022 Target Resolution Date: 11/30/2022 Goal Status: Active Ulcer/skin breakdown will have a volume reduction of 30% by week 4 Date Initiated: 01/08/2022 Date Inactivated: 02/18/2022 Target Resolution Date: 02/05/2022 Goal Status: Unmet Unmet Reason: VAC leaking Ulcer/skin breakdown will have a volume reduction of 50% by week 8 Date Initiated: 02/18/2022 Date Inactivated: 03/04/2022 Target Resolution Date: 03/05/2022 Goal Status: Unmet Unmet Reason: infection Ulcer/skin breakdown will have a volume reduction of 80% by week 12 Date Initiated:  03/04/2022 Date Inactivated: 04/01/2022 Target Resolution Date: 04/02/2022 Goal Status: Unmet Unmet Reason: too much moisture Interventions: Assess patient/caregiver ability to obtain necessary supplies Assess patient/caregiver ability to perform ulcer/skin care regimen upon admission and as needed Assess ulceration(s) every visit Provide education on ulcer and skin care Treatment Activities: Skin care regimen initiated : 01/08/2022 Topical wound management initiated : 01/08/2022 Notes: Electronic Signature(s) Signed:  11/02/2022 5:30:43 PM By: Baruch Gouty RN, BSN Entered By: Baruch Gouty on 11/02/2022 14:02:27 -------------------------------------------------------------------------------- Pain Assessment Details Patient Name: Date of Service: [name], [name] 11/02/2022 1:15 PM Medical Record Number: [id number] Patient Account Number: [id number] Date of Birth/Sex: Treating RN: 22-May-1966 (57 y.o. [name] Primary Care [place] Mokry: Berniece Pap Other Clinician: Referring Kadince Boxley: Treating Luisfelipe Engelstad/Extender: Lannie Fields in Treatment: 42 Active Problems Location of Pain Severity and Description of Pain Patient Has Paino No Site Locations Rate the pain. VIRDEN, MCLAFFERTY ([id number]) [id number] Page 5 of 7 Rate the pain. Current Pain Level: 0 Pain Management and Medication Current Pain Management: Notes reports chronic back pain Electronic Signature(s) Signed: 11/02/2022 5:30:43 PM By: Baruch Gouty RN, BSN Entered By: Baruch Gouty on 11/02/2022 13:49:31 -------------------------------------------------------------------------------- Patient/Caregiver Education Details Patient Name: Date of Service: [name], [place] 2/26/2024andnbsp1:15 PM Medical Record Number: [id number] Patient Account Number: [id number] Date of Birth/Gender: Treating RN: [birth year] (57 y.o. [name] Primary Care Physician: Berniece Pap Other Clinician: Referring Physician: Treating Physician/Extender: Lannie Fields in Treatment: 77 Education Assessment Education Provided To: Patient Education Topics Provided Pressure: Methods: Explain/Verbal Responses: Reinforcements needed, State content correctly Wound/Skin Impairment: Methods: Explain/Verbal Responses: Reinforcements needed, State content correctly Electronic Signature(s) Signed: 11/02/2022 5:30:43 PM By: Baruch Gouty RN, BSN Entered By: Baruch Gouty  on 11/02/2022 14:02:53 -------------------------------------------------------------------------------- Wound Assessment Details Patient Name: Date of Service: [name], [name] 11/02/2022 1:15 PM Medical Record Number: [id number] Patient Account Number: [id number] Date of Birth/Sex: Treating RN: 1966-02-26 (57 y.o. [name] Primary Care Drelyn Pistilli: Berniece Pap Other Clinician: Referring Tim Corriher: Treating Yuvin Bussiere/Extender: Lannie Fields in Treatment: [address] [place] ([id number]) [id number] Page 6 of 7 Wound Status Wound Number: 1 Primary Pressure Ulcer Etiology: Wound Location: Sacrum Wound Open Wounding Event: Pressure Injury Status: Date Acquired: 07/29/2021 Comorbid Congestive Heart Failure, Hypertension, Type II Diabetes, Weeks Of Treatment: 42 History: Osteomyelitis, Confinement Anxiety Clustered Wound: No Photos Wound Measurements Length: (cm) 0.5 Width: (cm) 0.2 Depth: (cm) 0.8 Area: (cm) 0.079 Volume: (cm) 0.063 % Reduction in Area: 98.7% % Reduction in Volume: 99% Epithelialization: Small (1-33%) Tunneling: No Undermining: No Wound Description Classification: Category/Stage IV Wound Margin: Epibole Exudate Amount: Medium Exudate Type: Serous Exudate Color: amber Foul Odor After Cleansing: No Slough/Fibrino No Wound Bed Granulation Amount: Large (67-100%) Exposed Structure Granulation Quality: Red Fascia Exposed: No Necrotic Amount: None Present (0%) Fat Layer (Subcutaneous Tissue) Exposed: Yes Tendon Exposed: No Muscle Exposed: No Joint Exposed: No Bone Exposed: No Periwound Skin Texture Texture Color No Abnormalities Noted: Yes No Abnormalities Noted: Yes Moisture Temperature / Pain No Abnormalities Noted: Yes Temperature: No Abnormality Treatment Notes Wound #1 (Sacrum) Cleanser Soap and Water Discharge Instruction: May shower and wash wound with dial antibacterial soap and  water prior to dressing change. Wound Cleanser Discharge Instruction: Cleanse the wound with wound cleanser prior to applying a clean dressing using gauze sponges, not tissue or cotton balls. Peri-Wound Care Skin Prep Discharge Instruction: Use skin prep as directed Zinc Oxide Ointment 30g tube Discharge Instruction: Apply Zinc Oxide to macerated periwound with each  dressing change as needed Topical Primary Dressing Promogran Prisma Matrix, 4.34 (sq in) (silver collagen) Discharge Instruction: Moisten collagen with saline and place in base or wound Rasmus, Moise ([id number]) [phone].pdf Page 7 of 7 Secondary Dressing Woven Gauze Sponges 2x2 in Discharge Instruction: Moisten with saline and pack into wound lightly to hold collagen in place Zetuvit Plus Silicone Border Sacrum Dressing, Sm, 7x7 (in/in) Discharge Instruction: Apply silicone border over primary dressing as directed. Secured With 38M Medipore H Soft Cloth Surgical T ape, 4 x 10 (in/yd) Discharge Instruction: Secure with tape as directed. Compression Wrap Compression Stockings Add-Ons Gloves, Art gallery manager) Signed: 11/02/2022 5:30:43 PM By: Baruch Gouty RN, BSN Entered By: Baruch Gouty on 11/02/2022 14:00:37 -------------------------------------------------------------------------------- Vitals Details Patient Name: Date of Service: [name], [name] 11/02/2022 1:15 PM Medical Record Number: [id number] Patient Account Number: [id number] Date of Birth/Sex: Treating RN: 20-Mar-1966 (57 y.o. [name] Primary Care Leaha Cuervo: Berniece Pap Other Clinician: Referring Uchenna Rappaport: Treating Emylee Decelle/Extender: Lannie Fields in Treatment: 42 Vital Signs Time Taken: 13:48 Temperature (F): 97.7 Height (in): 75 Pulse (bpm): 101 Weight (lbs): 220 Respiratory Rate (breaths/min): 20 Body Mass Index (BMI): 27.5 Blood Pressure (mmHg): 119/75 Capillary  Blood Glucose (mg/dl): 115 Reference Range: 80 - 120 mg / dl Notes glucose per pt report this am Electronic Signature(s) Signed: 11/02/2022 5:30:43 PM By: Baruch Gouty RN, BSN Entered By: Baruch Gouty on 11/02/2022 13:49:08

## 2022-11-03 NOTE — Progress Notes (Signed)
ZYLER, GAYE ([id number]) [id number] Page 1 of 9 Visit Report for 11/02/2022 Chief Complaint Document Details Patient Name: Date of Service: [name] NN, [name] 11/02/2022 1:15 PM Medical Record Number: [id number] Patient Account Number: [id number] Date of Birth/Sex: Treating RN: 10/21/1965 (57 y.o. M) Primary Care Provider: Berniece Pap Other Clinician: Referring Provider: Treating Provider/Extender: Lannie Fields in Treatment: 42 Information Obtained from: Patient Chief Complaint Patient is at the clinic for treatment of an open pressure ulcer Electronic Signature(s) Signed: 11/02/2022 2:21:00 PM By: [name] MD FACS Entered By: [name] on 11/02/2022 14:21:00 -------------------------------------------------------------------------------- HPI Details Patient Name: Date of Service: [name], [name] 11/02/2022 1:15 PM Medical Record Number: [id number] Patient Account Number: [id number] Date of Birth/Sex: Treating RN: 15-Jul-1966 (57 y.o. M) Primary Care Provider: Berniece Pap Other Clinician: Referring Provider: Treating Provider/Extender: Lannie Fields in Treatment: 42 History of Present Illness HPI Description: ADMISSION 01/08/2022 This is a 57 year old male with a past medical history notable for type 2 diabetes mellitus (last A1c was 8.6) congestive heart failure, hypertension, chronic pain, and bilateral below-knee amputations. His most recent amputation was in November 2022. While he was in the hospital, he developed a sacral pressure ulcer. He was subsequently in a skilled nursing facility for some time. He was discharged with home health and had been in a wound VAC, but was then admitted to the hospital last week when the wound appeared to be worsening. Apparently the periwound skin was macerated and the device that he had been using was leaking quite a bit. Evaluation while in the  hospital included a consultation with infectious disease who did not think he had any evidence of osteomyelitis, plastic surgery who felt that he was not an appropriate flap candidate (their note also states that he is not interested in a flap) and wound care who took him out of the wound VAC and initiated wet-to-dry dressing changes. He has a new wound VAC from [facility] on order, anticipated delivery today. The wound itself is fairly small and isolated to the sacrum. There is muscle exposed. No bone is appreciated but it is palpable beneath the surface. The muscle itself is bit pale and there is heaped up epibole around the wound edges. No significant odor or drainage. 01/14/2022: His wound VAC was not initiated until this past Friday. He has not had any issues with the VAC but today we found that the bridge foam was applied directly to the skin rather than over a layer of adhesive drape. His home health nurse also requested that we consider applying silver collagen to the wound bed in addition to the VAC. There is still a little bit of heaped up senescent skin around the wound periphery. No significant change to the wound dimensions. 01/21/2022: No significant change to the wound dimensions. The senescent skin has not reaccumulated. The periwound skin remains a bit macerated but without any obvious breakdown. The wound surface itself has a shiny appearance with a little bit of slough accumulation; no true robust granulation tissue at this time. 01/28/2022: The wound is about the same size but a little bit shallower. There is a little bit of senescent skin reaccumulation at the cranial aspect. The periwound skin is red but not macerated and without any tissue breakdown. The wound still does not have the most robust surface. There is a bit of slough accumulation. 02/04/2022: The wound is a bit smaller and the undermining has come in somewhat. There continues to be granulation tissue  formation within the wound  bed. No significant slough accumulation and his periwound skin is in better condition. 02/18/2022: The wound stinks today. There is no obvious pus but the drainage and wound itself are malodorous. He continues to have heaped up tissue within the wound bed that is rather grayish and not particularly robust-appearing. He is very angry today with his situation. 02/25/2022: Last week, in response to the odor coming from the wound, I took a culture and prescribed topical gentamicin as well as oral cefdinir. Apparently [facility] contacted him about a topical compounded antibiotic, but he did not realize this and he hung up on them. Today, the odor is no longer present. There is some senescent skin around the wound margins as well as continued heaped up granulation tissue in the center of the wound. The undermining continues to contract. 03/04/2022: The wound continues to contract and look better. He still has heaped up hypertrophic granulation tissue near the orifice. No odor was coming from the wound today. He is awaiting [place] delivery. 03/18/2022: 2-week follow-up. [facility] topical antibiotic has been initiated. The chemical cauterization of the hypertrophic granulation tissue was quite successful and the surface is much flatter today. He has heaped up senescent skin around the perimeter. His home health providers have figured out a way to keep the wound VAC from losing suction by bolstering with DuoDERM. Overall there has been significant improvement since our last visit. WHITT, HAVARD ([id number]) [id number] Page 2 of 9 04/01/2022: 2-week follow-up. The wound continues to contract. Once again, there is heaped up senescent skin around the perimeter. He has a little bit of skin breakdown in the distribution of the adhesive drape from the wound VAC. No odor or purulent drainage. No concern for infection. 04/15/2022: 2-week follow-up. He has developed a fairly substantial rash  from the drape adhesive for his wound VAC. The periwound has a lot of heaped up epiboly at the margins. The tissue in the wound bed is a little bit pale but there is no odor or purulent drainage. 04/22/2022: Last week we discontinued the VAC. Today, the skin around his wound is in significantly improved condition. His rash is resolving. He does have some heaped up senescent skin around the wound margin and a layer of slough on the wound surface, but there is no concern for active infection. 04/29/2022: The wound continues to contract. The surface is clean. He continues to build up senescent skin around the wound edges that subsequently get a little bit macerated. No concern for infection. 05/06/2022: The wound is smaller again today in all dimensions. The surface is clean. He has his usual accumulation of macerated senescent skin around the wound edges. 05/13/2022: Continued wound contracture. The undermining has decreased quite a bit. Clean surface. Senescent skin heaped up around the wound edges, as per usual. 05/22/2022: The macerated senescent skin has accumulated once again. The wound dimensions are slightly smaller and the surface is clean. He has been having difficulty keeping the packing in the wound due to the large foam border dressing that he prefers. 06/03/2022: The wound is smaller and shallower with less undermining. He has built up macerated senescent skin around the margins, as per usual. He and his friend have figured out a way to make sure that his packing stays in the wound with his [place]. 06/12/2022: The wound is a little bit shallower again today. He still has accumulated senescent periwound skin, but otherwise the wound is clean. He will be undergoing bilateral recurrent inguinal hernia  repair and umbilical hernia repair next week. 10/20 sacral pressure ulcer. using [facility] and backing wet to dry. Has developed a rash sur rounding the wound 07/03/2022: Continued contracture of the  wound. It is very clean and there is no odor or purulent drainage. Continued buildup of senescent skin around the margin. 07/13/2022: There is less undermining present today. The orifice continues to contract. He continues to accumulate senescent skin around the perimeter. The rash around the wound has improved. 07/27/2022: The wound is smaller again today and the undermining is closed then. There is senescent skin accumulation, as usual. 08/07/2022: The wound continues to contract but has its usual accumulation of periwound senescent skin. Unfortunately, the periwound skin looks like it has gotten more moist. It is red and angry and the patient says it is more painful. 08/14/2022: The wound is smaller again this week. His periwound skin is in much better condition. There is still senescent skin around the wound margins and a bit of slough on the surface. 12/18; this is a lower sacral pressure ulcer. We have been using silver alginate for the last 2 weeks. This is longstanding and refractory. At 1 point I think had exposed bone he no longer has this now. He tells me that he has had problems with his right lower leg stump and he is going to see hangers she is therefore not walking but using his wheelchair although he says he is in his wheelchair "only 10% of the time]. Otherwise he claims to be rigorous with offloading this. 09/08/2022: The wound is considerably smaller than the last time I saw it. He has, as usual, the senescent skin accumulation around the wound margin. The undermining and depth, as well as the overall dimensions have come in significantly. 09/21/2022: The wound dimensions are roughly the same, no significant accumulation of periwound senescent skin. There is some reaccumulation of hypertrophic granulation tissue on the wound bed, however. He is spending a bit more time in his wheelchair due to issues with his prosthetics, but he is hopeful that this will be resolved within the next week  or so. 10/05/2022: The undermined portion of his wound has come in quite a bit. They are having difficulty getting the silver alginate to stay in place due to the decrease in wound size. He has accumulated senescent skin around the wound margins. 10/19/2022: The wound is down to just a couple of millimeters. 11/02/2022: No significant change in the wound dimensions, but there is no undermining present. No senescent skin accumulation. Electronic Signature(s) Signed: 11/02/2022 2:21:44 PM By: [name] MD FACS Entered By: [name] on 11/02/2022 14:21:44 -------------------------------------------------------------------------------- Physical Exam Details Patient Name: Date of Service: [name], [name] 11/02/2022 1:15 PM Medical Record Number: [id number] Patient Account Number: [id number] Date of Birth/Sex: Treating RN: Apr 10, 1966 (57 y.o. M) Primary Care Provider: Berniece Pap Other Clinician: Referring Provider: Treating Provider/Extender: Lannie Fields in Treatment: 42 Constitutional . Slightly tachycardic. . . no acute distress. Respiratory Normal work of breathing on room air. CHIA, WHEELEY ([id number]) [id number] Page 3 of 9 Notes 11/02/2022: No significant change in the wound dimensions, but there is no undermining present. No senescent skin accumulation. Electronic Signature(s) Signed: 11/02/2022 2:23:22 PM By: [name] MD FACS Entered By: [name] on 11/02/2022 14:23:22 -------------------------------------------------------------------------------- Physician Orders Details Patient Name: Date of Service: [name], [name] 11/02/2022 1:15 PM Medical Record Number: [id number] Patient Account Number: [id number] Date of Birth/Sex: Treating RN: [birth year] (57 y.o. [name] Primary  Care Provider: Berniece Pap Other Clinician: Referring Provider: Treating Provider/Extender: Lannie Fields in [id number] Verbal / Phone Orders: No Diagnosis Coding ICD-10 Coding Code Description L89.154 Pressure ulcer of sacral region, stage 4 Z89.512 Acquired absence of left leg below knee Z89.511 Acquired absence of right leg below knee Follow-up Appointments ppointment in 2 weeks. - Dr. Celine Ahr - room 1 Return A Monday 3/11 @ 1:15 pm Anesthetic Wound #1 Sacrum (In clinic) Topical Lidocaine 4% applied to wound bed Bathing/ Shower/ Hygiene May shower and wash wound with soap and water. Off-Loading Gel mattress overlay (Group 1) Turn and reposition every 2 hours Wound Treatment Wound #1 - Sacrum Cleanser: Soap and Water 3 x Per Week/30 Days Discharge Instructions: May shower and wash wound with dial antibacterial soap and water prior to dressing change. Cleanser: Wound Cleanser 3 x Per Week/30 Days Discharge Instructions: Cleanse the wound with wound cleanser prior to applying a clean dressing using gauze sponges, not tissue or cotton balls. Peri-Wound Care: Skin Prep 3 x Per Week/30 Days Discharge Instructions: Use skin prep as directed Peri-Wound Care: Zinc Oxide Ointment 30g tube 3 x Per Week/30 Days Discharge Instructions: Apply Zinc Oxide to macerated periwound with each dressing change as needed Prim Dressing: Promogran Prisma Matrix, 4.34 (sq in) (silver collagen) 3 x Per Week/30 Days ary Discharge Instructions: Moisten collagen with saline and place in base or wound Secondary Dressing: Woven Gauze Sponges 2x2 in 3 x Per Week/30 Days Discharge Instructions: Moisten with saline and pack into wound lightly to hold collagen in place Secondary Dressing: Zetuvit Plus Silicone Border Sacrum Dressing, Sm, 7x7 (in/in) (Generic) 3 x Per Week/30 Days Discharge Instructions: Apply silicone border over primary dressing as directed. Secured With: 32M Medipore H Soft Cloth Surgical T ape, 4 x 10 (in/yd) (Generic) 3 x Per Week/30 Days Discharge Instructions:  Secure with tape as directed. Add-Ons: Gloves, Large 3 x Per Week/30 Days Electronic Signature(s) LOMAX, CENTOLA ([id number]) [id number] Page 4 of 9 Signed: 11/02/2022 5:09:54 PM By: [name] MD FACS Entered By: [name] on 11/02/2022 14:23:44 -------------------------------------------------------------------------------- Problem List Details Patient Name: Date of Service: [name], [name] 11/02/2022 1:15 PM Medical Record Number: [id number] Patient Account Number: [id number] Date of Birth/Sex: Treating RN: Aug 15, 1966 (57 y.o. M) Primary Care Provider: Berniece Pap Other Clinician: Referring Provider: Treating Provider/Extender: Lannie Fields in Treatment: 42 Active Problems ICD-10 Encounter Code Description Active Date MDM Diagnosis L89.154 Pressure ulcer of sacral region, stage 4 01/08/2022 No Yes Z89.512 Acquired absence of left leg below knee 01/08/2022 No Yes Z89.511 Acquired absence of right leg below knee 01/08/2022 No Yes Inactive Problems Resolved Problems Electronic Signature(s) Signed: 11/02/2022 2:20:45 PM By: [name] MD FACS Entered By: [name] on 11/02/2022 14:20:45 -------------------------------------------------------------------------------- Progress Note Details Patient Name: Date of Service: [name], [name] 11/02/2022 1:15 PM Medical Record Number: [id number] Patient Account Number: [id number] Date of Birth/Sex: Treating RN: Apr 02, 1966 (57 y.o. M) Primary Care Provider: Berniece Pap Other Clinician: Referring Provider: Treating Provider/Extender: Lannie Fields in Treatment: 42 Subjective Chief Complaint Information obtained from Patient Patient is at the clinic for treatment of an open pressure ulcer History of Present Illness (HPI) ADMISSION 01/08/2022 This is a 57 year old male with a past medical history notable for type 2 diabetes mellitus  (last A1c was 8.6) congestive heart failure, hypertension, chronic pain, and bilateral below-knee amputations. His most recent amputation was in November 2022. While he was in the hospital, he developed  a sacral pressure ulcer. He was subsequently in a skilled nursing facility for some time. He was discharged with home health and had been in a wound VAC, but was then admitted to the hospital last week when the wound appeared to be worsening. Apparently the periwound skin was macerated and the device that he had been using was leaking quite a bit. Evaluation while in the hospital included a consultation with infectious disease who did not think he had any evidence of osteomyelitis, plastic surgery who felt that he was not an appropriate flap candidate (their note also states that he is not interested in a flap) and wound care who took him out of the wound VAC and initiated wet-to-dry dressing changes. He has a new wound VAC from [facility] on order, anticipated delivery today. The wound itself is fairly small and isolated to the sacrum. There is muscle exposed. No bone is appreciated but it is palpable beneath the surface. The muscle itself is bit pale and there is heaped up epibole around the wound edges. No significant odor or drainage. 01/14/2022: His wound VAC was not initiated until this past Friday. He has not had any issues with the VAC but today we found that the bridge foam was applied directly to the skin rather than over a layer of adhesive drape. His home health nurse also requested that we consider applying silver collagen to the wound bed in addition to the VAC. There is still a little bit of heaped up senescent skin around the wound periphery. No significant change to the wound [name], [name] ([id number]) [id number] Page 5 of 9 dimensions. 01/21/2022: No significant change to the wound dimensions. The senescent skin has not reaccumulated. The periwound skin remains a  bit macerated but without any obvious breakdown. The wound surface itself has a shiny appearance with a little bit of slough accumulation; no true robust granulation tissue at this time. 01/28/2022: The wound is about the same size but a little bit shallower. There is a little bit of senescent skin reaccumulation at the cranial aspect. The periwound skin is red but not macerated and without any tissue breakdown. The wound still does not have the most robust surface. There is a bit of slough accumulation. 02/04/2022: The wound is a bit smaller and the undermining has come in somewhat. There continues to be granulation tissue formation within the wound bed. No significant slough accumulation and his periwound skin is in better condition. 02/18/2022: The wound stinks today. There is no obvious pus but the drainage and wound itself are malodorous. He continues to have heaped up tissue within the wound bed that is rather grayish and not particularly robust-appearing. He is very angry today with his situation. 02/25/2022: Last week, in response to the odor coming from the wound, I took a culture and prescribed topical gentamicin as well as oral cefdinir. Apparently [facility] contacted him about a topical compounded antibiotic, but he did not realize this and he hung up on them. Today, the odor is no longer present. There is some senescent skin around the wound margins as well as continued heaped up granulation tissue in the center of the wound. The undermining continues to contract. 03/04/2022: The wound continues to contract and look better. He still has heaped up hypertrophic granulation tissue near the orifice. No odor was coming from the wound today. He is awaiting [place] delivery. 03/18/2022: 2-week follow-up. [facility] topical antibiotic has been initiated. The chemical cauterization of the hypertrophic granulation tissue was quite successful and  the surface is much flatter today. He has heaped up senescent  skin around the perimeter. His home health providers have figured out a way to keep the wound VAC from losing suction by bolstering with DuoDERM. Overall there has been significant improvement since our last visit. 04/01/2022: 2-week follow-up. The wound continues to contract. Once again, there is heaped up senescent skin around the perimeter. He has a little bit of skin breakdown in the distribution of the adhesive drape from the wound VAC. No odor or purulent drainage. No concern for infection. 04/15/2022: 2-week follow-up. He has developed a fairly substantial rash from the drape adhesive for his wound VAC. The periwound has a lot of heaped up epiboly at the margins. The tissue in the wound bed is a little bit pale but there is no odor or purulent drainage. 04/22/2022: Last week we discontinued the VAC. Today, the skin around his wound is in significantly improved condition. His rash is resolving. He does have some heaped up senescent skin around the wound margin and a layer of slough on the wound surface, but there is no concern for active infection. 04/29/2022: The wound continues to contract. The surface is clean. He continues to build up senescent skin around the wound edges that subsequently get a little bit macerated. No concern for infection. 05/06/2022: The wound is smaller again today in all dimensions. The surface is clean. He has his usual accumulation of macerated senescent skin around the wound edges. 05/13/2022: Continued wound contracture. The undermining has decreased quite a bit. Clean surface. Senescent skin heaped up around the wound edges, as per usual. 05/22/2022: The macerated senescent skin has accumulated once again. The wound dimensions are slightly smaller and the surface is clean. He has been having difficulty keeping the packing in the wound due to the large foam border dressing that he prefers. 06/03/2022: The wound is smaller and shallower with less undermining. He has built up  macerated senescent skin around the margins, as per usual. He and his friend have figured out a way to make sure that his packing stays in the wound with his [place]. 06/12/2022: The wound is a little bit shallower again today. He still has accumulated senescent periwound skin, but otherwise the wound is clean. He will be undergoing bilateral recurrent inguinal hernia repair and umbilical hernia repair next week. 10/20 sacral pressure ulcer. using [facility] and backing wet to dry. Has developed a rash sur rounding the wound 07/03/2022: Continued contracture of the wound. It is very clean and there is no odor or purulent drainage. Continued buildup of senescent skin around the margin. 07/13/2022: There is less undermining present today. The orifice continues to contract. He continues to accumulate senescent skin around the perimeter. The rash around the wound has improved. 07/27/2022: The wound is smaller again today and the undermining is closed then. There is senescent skin accumulation, as usual. 08/07/2022: The wound continues to contract but has its usual accumulation of periwound senescent skin. Unfortunately, the periwound skin looks like it has gotten more moist. It is red and angry and the patient says it is more painful. 08/14/2022: The wound is smaller again this week. His periwound skin is in much better condition. There is still senescent skin around the wound margins and a bit of slough on the surface. 12/18; this is a lower sacral pressure ulcer. We have been using silver alginate for the last 2 weeks. This is longstanding and refractory. At 1 point I think had exposed bone he  no longer has this now. He tells me that he has had problems with his right lower leg stump and he is going to see hangers she is therefore not walking but using his wheelchair although he says he is in his wheelchair "only 10% of the time]. Otherwise he claims to be rigorous with offloading this. 09/08/2022: The  wound is considerably smaller than the last time I saw it. He has, as usual, the senescent skin accumulation around the wound margin. The undermining and depth, as well as the overall dimensions have come in significantly. 09/21/2022: The wound dimensions are roughly the same, no significant accumulation of periwound senescent skin. There is some reaccumulation of hypertrophic granulation tissue on the wound bed, however. He is spending a bit more time in his wheelchair due to issues with his prosthetics, but he is hopeful that this will be resolved within the next week or so. 10/05/2022: The undermined portion of his wound has come in quite a bit. They are having difficulty getting the silver alginate to stay in place due to the decrease in wound size. He has accumulated senescent skin around the wound margins. 10/19/2022: The wound is down to just a couple of millimeters. 11/02/2022: No significant change in the wound dimensions, but there is no undermining present. No senescent skin accumulation. Patient History Information obtained from Patient, Caregiver, Chart. TAESEAN, STADTLANDER ([id number]) [id number] Page 6 of 9 Family History Cancer - Father, Heart Disease - Father,Paternal Grandparents, Hypertension - Father, No family history of Diabetes, Hereditary Spherocytosis, Kidney Disease, Lung Disease, Seizures, Stroke, Thyroid Problems, Tuberculosis. Social History Never smoker, Marital Status - Divorced, Alcohol Use - Never, Drug Use - No History, Caffeine Use - Moderate - coffee. Medical History Eyes Denies history of Cataracts, Glaucoma, Optic Neuritis Ear/Nose/Mouth/Throat Denies history of Chronic sinus problems/congestion, Middle ear problems Cardiovascular Patient has history of Congestive Heart Failure, Hypertension Endocrine Patient has history of Type II Diabetes Denies history of Type I Diabetes Genitourinary Denies history of End Stage Renal  Disease Integumentary (Skin) Denies history of History of Burn Musculoskeletal Patient has history of Osteomyelitis - S1 and coccyx Oncologic Denies history of Received Chemotherapy, Received Radiation Psychiatric Patient has history of Confinement Anxiety Denies history of Anorexia/bulimia Hospitalization/Surgery History - bil BKA. Medical A Surgical History Notes nd Respiratory pulmonary nodules Genitourinary renal mass, urinary hesitancy Musculoskeletal discitis of thoracic region Objective Constitutional Slightly tachycardic. no acute distress. Vitals Time Taken: 1:48 PM, Height: 75 in, Weight: 220 lbs, BMI: 27.5, Temperature: 97.7 F, Pulse: 101 bpm, Respiratory Rate: 20 breaths/min, Blood Pressure: 119/75 mmHg, Capillary Blood Glucose: 115 mg/dl. General Notes: glucose per pt report this am Respiratory Normal work of breathing on room air. General Notes: 11/02/2022: No significant change in the wound dimensions, but there is no undermining present. No senescent skin accumulation. Integumentary (Hair, Skin) Wound #1 status is Open. Original cause of wound was Pressure Injury. The date acquired was: 07/29/2021. The wound has been in treatment 42 weeks. The wound is located on the Sacrum. The wound measures 0.5cm length x 0.2cm width x 0.8cm depth; 0.079cm^2 area and 0.063cm^3 volume. There is Fat Layer (Subcutaneous Tissue) exposed. There is no tunneling or undermining noted. There is a medium amount of serous drainage noted. The wound margin is epibole. There is large (67-100%) red granulation within the wound bed. There is no necrotic tissue within the wound bed. The periwound skin appearance had no abnormalities noted for texture. The periwound skin appearance had no abnormalities noted for moisture.  The periwound skin appearance had no abnormalities noted for color. Periwound temperature was noted as No Abnormality. 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 - Dr. Celine Ahr - room 1 Monday 3/11 @ 1:15 pm Anesthetic: Wound #1 Sacrum: (In clinic) Topical Lidocaine 4% applied to wound bed Bathing/ Shower/ Hygiene: May shower and wash wound with soap and water. Off-Loading: Gel mattress overlay (Group 1) Turn and reposition every 2 hours WOUND #1: - Sacrum Wound Laterality: Cleanser: Soap and Water 3 x Per Week/30 Days Discharge Instructions: May shower and wash wound with dial antibacterial soap and water prior to dressing change. Cleanser: Wound Cleanser 3 x Per Week/30 Days Discharge Instructions: Cleanse the wound with wound cleanser prior to applying a clean dressing using gauze sponges, not tissue or cotton balls. Peri-Wound Care: Skin Prep 3 x Per Week/30 Days Discharge Instructions: Use skin prep as directed Peri-Wound Care: Zinc Oxide Ointment 30g tube 3 x Per Week/30 Days Discharge Instructions: Apply Zinc Oxide to macerated periwound with each dressing change as needed Prim Dressing: Promogran Prisma Matrix, 4.34 (sq in) (silver collagen) 3 x Per Week/30 Days ary Discharge Instructions: Moisten collagen with saline and place in base or wound Secondary Dressing: Woven Gauze Sponges 2x2 in 3 x Per Week/30 Days Discharge Instructions: Moisten with saline and pack into wound lightly to hold collagen in place Secondary Dressing: Zetuvit Plus Silicone Border Sacrum Dressing, Sm, 7x7 (in/in) (Generic) 3 x Per Week/30 Days Discharge Instructions: Apply silicone border over primary dressing as directed. Secured With: 62M Medipore H Soft Cloth Surgical T ape, 4 x 10 (in/yd) (Generic) 3 x Per Week/30 Days Discharge Instructions: Secure with tape as directed. Add-Ons: Gloves, Large 3 x Per Week/30  Days 11/02/2022: No significant change in the wound dimensions, but there is no undermining present. No senescent skin accumulation. No debridement was necessary today. We will continue to pack the remaining cavity with Prisma silver collagen and use some gauze to maintain contact of the collagen with the wound surface. Cover with a foam border dressing. Follow-up in 2 weeks. Electronic Signature(s) Signed: 11/02/2022 2:29:02 PM By: [name] MD FACS Entered By: [name] on 11/02/2022 14:29:02 -------------------------------------------------------------------------------- HxROS Details Patient Name: Date of Service: [name], [name] 11/02/2022 1:15 PM Medical Record Number: [id number] Patient Account Number: [id number] Date of Birth/Sex: Treating RN: 1966-07-30 (57 y.o. M) Primary Care Provider: Berniece Pap Other Clinician: Referring Provider: Treating Provider/Extender: Lannie Fields in Treatment: 42 Information Obtained From Patient Caregiver Chart Eyes Medical History: Negative for: Cataracts; Glaucoma; Optic Neuritis Ear/Nose/Mouth/Throat Medical History: Negative for: Chronic sinus problems/congestion; Middle ear problems Respiratory Medical History: Past Medical History Notes: pulmonary nodules Cardiovascular Medical History: Positive for: Congestive Heart Failure; Hypertension [name], [name] ([id number]) [id number] Page 8 of 9 Endocrine Medical History: Positive for: Type II Diabetes Negative for: Type I Diabetes Time with diabetes: since 2004 Treated with: Insulin Blood sugar tested every day: Yes Tested : 4 times per day Genitourinary Medical History: Negative for: End Stage Renal Disease Past Medical History Notes: renal mass, urinary hesitancy Integumentary (Skin) Medical History: Negative for: History of Burn Musculoskeletal Medical History: Positive for: Osteomyelitis - S1 and  coccyx Past Medical History Notes: discitis of thoracic region Oncologic Medical History: Negative for: Received Chemotherapy; Received Radiation Psychiatric Medical History: Positive  for: Confinement Anxiety Negative for: Anorexia/bulimia Immunizations Pneumococcal Vaccine: Received Pneumococcal Vaccination: No Implantable Devices No devices added Hospitalization / Surgery History Type of Hospitalization/Surgery bil BKA Family and Social History Cancer: Yes - Father; Diabetes: No; Heart Disease: Yes - Father,Paternal Grandparents; Hereditary Spherocytosis: No; Hypertension: Yes - Father; Kidney Disease: No; Lung Disease: No; Seizures: No; Stroke: No; Thyroid Problems: No; Tuberculosis: No; Never smoker; Marital Status - Divorced; Alcohol Use: Never; Drug Use: No History; Caffeine Use: Moderate - coffee; Financial Concerns: No; Food, Clothing or Shelter Needs: No; Support System Lacking: No; Transportation Concerns: Yes - hurts to transfer Electronic Signature(s) Signed: 11/02/2022 5:09:54 PM By: [name] MD FACS Entered By: [name] on 11/02/2022 14:22:43 -------------------------------------------------------------------------------- SuperBill Details Patient Name: Date of Service: [name], [name] 11/02/2022 Medical Record Number: [id number] Patient Account Number: [id number] Date of Birth/Sex: Treating RN: 09-Mar-1966 (57 y.o. M) Primary Care Provider: Berniece Pap Other Clinician: Referring Provider: Treating Provider/Extender: Lannie Fields in Treatment: [address] HARVIN, MCCULLY ([id number]) [id number] Page 9 of 9 ICD-10 Codes Code Description L89.154 Pressure ulcer of sacral region, stage 4 Z89.512 Acquired absence of left leg below knee Z89.511 Acquired absence of right leg below knee Physician Procedures : CPT4 Code Description Modifier [id number] - WC PHYS LEVEL 3 - EST PT ICD-10  Diagnosis Description L89.154 Pressure ulcer of sacral region, stage 4 Z89.512 Acquired absence of left leg below knee Z89.511 Acquired absence of right leg below knee Quantity: 1 Electronic Signature(s) Signed: 11/02/2022 2:29:15 PM By: [name] MD FACS Entered By: [name] on 11/02/2022 14:29:15

## 2022-11-09 ENCOUNTER — Encounter: Payer: Self-pay | Admitting: Physical Therapy

## 2022-11-10 ENCOUNTER — Ambulatory Visit: Payer: Medicare HMO | Admitting: Orthopedic Surgery

## 2022-11-10 DIAGNOSIS — Z89511 Acquired absence of right leg below knee: Secondary | ICD-10-CM | POA: Diagnosis not present

## 2022-11-10 DIAGNOSIS — Z89512 Acquired absence of left leg below knee: Secondary | ICD-10-CM | POA: Diagnosis not present

## 2022-11-16 ENCOUNTER — Encounter (HOSPITAL_BASED_OUTPATIENT_CLINIC_OR_DEPARTMENT_OTHER): Payer: Medicare HMO | Attending: General Surgery | Admitting: General Surgery

## 2022-11-16 DIAGNOSIS — I509 Heart failure, unspecified: Secondary | ICD-10-CM | POA: Insufficient documentation

## 2022-11-16 DIAGNOSIS — Z89511 Acquired absence of right leg below knee: Secondary | ICD-10-CM | POA: Diagnosis not present

## 2022-11-16 DIAGNOSIS — I110 Hypertensive heart disease with heart failure: Secondary | ICD-10-CM | POA: Diagnosis not present

## 2022-11-16 DIAGNOSIS — Z89512 Acquired absence of left leg below knee: Secondary | ICD-10-CM | POA: Insufficient documentation

## 2022-11-16 DIAGNOSIS — L89154 Pressure ulcer of sacral region, stage 4: Secondary | ICD-10-CM | POA: Insufficient documentation

## 2022-11-16 DIAGNOSIS — E119 Type 2 diabetes mellitus without complications: Secondary | ICD-10-CM | POA: Diagnosis not present

## 2022-11-17 ENCOUNTER — Ambulatory Visit: Payer: Medicare HMO | Admitting: Orthopedic Surgery

## 2022-11-17 NOTE — Progress Notes (Signed)
[name], [name] ([id number]) [id number] Page 1 of 10 Visit Report for 11/16/2022 Chief Complaint Document Details Patient Name: Date of Service: [place], [name] 11/16/2022 1:15 PM Medical Record Number: [id number] Patient Account Number: [id number] Date of Birth/Sex: Treating RN: December 30, 1965 (57 y.o. M) Primary Care Provider: PCP, NO Other Clinician: Referring Provider: Treating Provider/Extender: [name] in Treatment: 44 Information Obtained from: Patient Chief Complaint Patient is at the clinic for treatment of an open pressure ulcer Electronic Signature(s) Signed: 11/16/2022 2:36:17 PM By: [name] MD FACS Entered By: [name] on 11/16/2022 14:36:17 -------------------------------------------------------------------------------- Debridement Details Patient Name: Date of Service: [name], [name] 11/16/2022 1:15 PM Medical Record Number: [id number] Patient Account Number: [id number] Date of Birth/Sex: Treating RN: 12-07-1965 (57 y.o. [name] Primary Care Provider: PCP, NO Other Clinician: Referring Provider: Treating Provider/Extender: [name] in Treatment: 44 Debridement Performed for Assessment: Wound #1 Sacrum Performed By: Physician [name], MD Debridement Type: Debridement Level of Consciousness (Pre-procedure): Awake and Alert Pre-procedure Verification/Time Out Yes - 13:55 Taken: Start Time: 13:55 T Area Debrided (L x W): otal 0.3 (cm) x 0.2 (cm) = 0.06 (cm) Tissue and other material debrided: Non-Viable, Callus, Skin: Epidermis Level: Skin/Epidermis Debridement Description: Selective/Open Wound Instrument: Curette Bleeding: Minimum Hemostasis Achieved: Pressure Procedural Pain: 0 Post Procedural Pain: 0 Response to Treatment: Procedure was tolerated well Level of Consciousness (Post- Awake and Alert procedure): Post Debridement  Measurements of Total Wound Length: (cm) 0.3 Stage: Category/Stage IV Width: (cm) 0.2 Depth: (cm) 0.5 Volume: (cm) 0.024 Character of Wound/Ulcer Post Debridement: Stable Post Procedure Diagnosis Same as Pre-procedure Notes Scribed for Dr. Celine Ahr by [name] Gouty RN Electronic Signature(s) Signed: 11/16/2022 5:44:44 PM By: [name] Gouty RN, BSN Signed: 11/17/2022 7:40:18 AM By: [name] MD FACS Entered By: [name] on 11/16/2022 13:58:15 [name] ([id number]) [id number] Page 2 of 10 -------------------------------------------------------------------------------- HPI Details Patient Name: Date of ServiceCyndie [name] NN, [name] 11/16/2022 1:15 PM Medical Record Number: [id number] Patient Account Number: [id number] Date of Birth/Sex: Treating RN: 05/03/1966 (57 y.o. M) Primary Care Provider: PCP, NO Other Clinician: Referring Provider: Treating Provider/Extender: [name] in Treatment: 44 History of Present Illness HPI Description: ADMISSION 01/08/2022 This is a 57 year old male with a past medical history notable for type 2 diabetes mellitus (last A1c was 8.6) congestive heart failure, hypertension, chronic pain, and bilateral below-knee amputations. His most recent amputation was in November 2022. While he was in the hospital, he developed a sacral pressure ulcer. He was subsequently in a skilled nursing facility for some time. He was discharged with home health and had been in a wound VAC, but was then admitted to the hospital last week when the wound appeared to be worsening. Apparently the periwound skin was macerated and the device that he had been using was leaking quite a bit. Evaluation while in the hospital included a consultation with infectious disease who did not think he had any evidence of osteomyelitis, plastic surgery who felt that he was not an appropriate flap candidate (their note also states  that he is not interested in a flap) and wound care who took him out of the wound VAC and initiated wet-to-dry dressing changes. He has a new wound VAC from [facility] on order, anticipated delivery today. The wound itself is fairly small and isolated to the sacrum. There is muscle exposed. No bone is appreciated but it is palpable beneath the surface. The muscle itself is bit pale and there  is heaped up epibole around the wound edges. No significant odor or drainage. 01/14/2022: His wound VAC was not initiated until this past Friday. He has not had any issues with the VAC but today we found that the bridge foam was applied directly to the skin rather than over a layer of adhesive drape. His home health nurse also requested that we consider applying silver collagen to the wound bed in addition to the VAC. There is still a little bit of heaped up senescent skin around the wound periphery. No significant change to the wound dimensions. 01/21/2022: No significant change to the wound dimensions. The senescent skin has not reaccumulated. The periwound skin remains a bit macerated but without any obvious breakdown. The wound surface itself has a shiny appearance with a little bit of slough accumulation; no true robust granulation tissue at this time. 01/28/2022: The wound is about the same size but a little bit shallower. There is a little bit of senescent skin reaccumulation at the cranial aspect. The periwound skin is red but not macerated and without any tissue breakdown. The wound still does not have the most robust surface. There is a bit of slough accumulation. 02/04/2022: The wound is a bit smaller and the undermining has come in somewhat. There continues to be granulation tissue formation within the wound bed. No significant slough accumulation and his periwound skin is in better condition. 02/18/2022: The wound stinks today. There is no obvious pus but the drainage and wound itself are malodorous. He continues  to have heaped up tissue within the wound bed that is rather grayish and not particularly robust-appearing. He is very angry today with his situation. 02/25/2022: Last week, in response to the odor coming from the wound, I took a culture and prescribed topical gentamicin as well as oral cefdinir. Apparently [facility] contacted him about a topical compounded antibiotic, but he did not realize this and he hung up on them. Today, the odor is no longer present. There is some senescent skin around the wound margins as well as continued heaped up granulation tissue in the center of the wound. The undermining continues to contract. 03/04/2022: The wound continues to contract and look better. He still has heaped up hypertrophic granulation tissue near the orifice. No odor was coming from the wound today. He is awaiting [place] delivery. 03/18/2022: 2-week follow-up. [facility] topical antibiotic has been initiated. The chemical cauterization of the hypertrophic granulation tissue was quite successful and the surface is much flatter today. He has heaped up senescent skin around the perimeter. His home health providers have figured out a way to keep the wound VAC from losing suction by bolstering with DuoDERM. Overall there has been significant improvement since our last visit. 04/01/2022: 2-week follow-up. The wound continues to contract. Once again, there is heaped up senescent skin around the perimeter. He has a little bit of skin breakdown in the distribution of the adhesive drape from the wound VAC. No odor or purulent drainage. No concern for infection. 04/15/2022: 2-week follow-up. He has developed a fairly substantial rash from the drape adhesive for his wound VAC. The periwound has a lot of heaped up epiboly at the margins. The tissue in the wound bed is a little bit pale but there is no odor or purulent drainage. 04/22/2022: Last week we discontinued the VAC. Today, the skin around his wound is in  significantly improved condition. His rash is resolving. He does have some heaped up senescent skin around the wound margin and a  layer of slough on the wound surface, but there is no concern for active infection. 04/29/2022: The wound continues to contract. The surface is clean. He continues to build up senescent skin around the wound edges that subsequently get a little bit macerated. No concern for infection. 05/06/2022: The wound is smaller again today in all dimensions. The surface is clean. He has his usual accumulation of macerated senescent skin around the wound edges. 05/13/2022: Continued wound contracture. The undermining has decreased quite a bit. Clean surface. Senescent skin heaped up around the wound edges, as per usual. 05/22/2022: The macerated senescent skin has accumulated once again. The wound dimensions are slightly smaller and the surface is clean. He has been having difficulty keeping the packing in the wound due to the large foam border dressing that he prefers. 06/03/2022: The wound is smaller and shallower with less undermining. He has built up macerated senescent skin around the margins, as per usual. He and his friend have figured out a way to make sure that his packing stays in the wound with his [place]. 06/12/2022: The wound is a little bit shallower again today. He still has accumulated senescent periwound skin, but otherwise the wound is clean. He will be undergoing bilateral recurrent inguinal hernia repair and umbilical hernia repair next week. 10/20 sacral pressure ulcer. using [facility] and backing wet to dry. Has developed a rash sur rounding the wound 07/03/2022: Continued contracture of the wound. It is very clean and there is no odor or purulent drainage. Continued buildup of senescent skin around the margin. [name], [name] ([id number]) [id number] Page 3 of 10 07/13/2022: There is less undermining present today. The orifice continues to  contract. He continues to accumulate senescent skin around the perimeter. The rash around the wound has improved. 07/27/2022: The wound is smaller again today and the undermining is closed then. There is senescent skin accumulation, as usual. 08/07/2022: The wound continues to contract but has its usual accumulation of periwound senescent skin. Unfortunately, the periwound skin looks like it has gotten more moist. It is red and angry and the patient says it is more painful. 08/14/2022: The wound is smaller again this week. His periwound skin is in much better condition. There is still senescent skin around the wound margins and a bit of slough on the surface. 12/18; this is a lower sacral pressure ulcer. We have been using silver alginate for the last 2 weeks. This is longstanding and refractory. At 1 point I think had exposed bone he no longer has this now. He tells me that he has had problems with his right lower leg stump and he is going to see hangers she is therefore not walking but using his wheelchair although he says he is in his wheelchair "only 10% of the time]. Otherwise he claims to be rigorous with offloading this. 09/08/2022: The wound is considerably smaller than the last time I saw it. He has, as usual, the senescent skin accumulation around the wound margin. The undermining and depth, as well as the overall dimensions have come in significantly. 09/21/2022: The wound dimensions are roughly the same, no significant accumulation of periwound senescent skin. There is some reaccumulation of hypertrophic granulation tissue on the wound bed, however. He is spending a bit more time in his wheelchair due to issues with his prosthetics, but he is hopeful that this will be resolved within the next week or so. 10/05/2022: The undermined portion of his wound has come in quite a bit. They are having  difficulty getting the silver alginate to stay in place due to the decrease in wound size. He has  accumulated senescent skin around the wound margins. 10/19/2022: The wound is down to just a couple of millimeters. 11/02/2022: No significant change in the wound dimensions, but there is no undermining present. No senescent skin accumulation. 11/06/2022: The wound is about a millimeter in diameter with a couple millimeters depth. It is clean. There is some callused skin around the opening. Electronic Signature(s) Signed: 11/16/2022 2:37:04 PM By: [name] MD FACS Entered By: [name] on 11/16/2022 14:37:04 -------------------------------------------------------------------------------- Physical Exam Details Patient Name: Date of Service: [name], [name] 11/16/2022 1:15 PM Medical Record Number: [id number] Patient Account Number: [id number] Date of Birth/Sex: Treating RN: 12-Jan-1966 (57 y.o. M) Primary Care Provider: PCP, NO Other Clinician: Referring Provider: Treating Provider/Extender: [name] in Treatment: 44 Constitutional Hypertensive, asymptomatic. Slightly tachycardic. . . no acute distress. Respiratory Normal work of breathing on room air. Notes 11/06/2022: The wound is about a millimeter in diameter with a couple millimeters depth. It is clean. There is some callused skin around the opening. Electronic Signature(s) Signed: 11/16/2022 2:37:36 PM By: [name] MD FACS Entered By: [name] on 11/16/2022 14:37:36 -------------------------------------------------------------------------------- Physician Orders Details Patient Name: Date of Service: [name], [name] 11/16/2022 1:15 PM Medical Record Number: [id number] Patient Account Number: [id number] Date of Birth/Sex: Treating RN: 1966-02-11 (57 y.o. [name] Primary Care Provider: PCP, NO Other Clinician: Referring Provider: Treating Provider/Extender: [name] in Treatment: 5 Verbal / Phone Orders: No Diagnosis Coding ICD-10  Coding [name], [name] ([id number]) [id number] Page 4 of 10 Code Description L89.154 Pressure ulcer of sacral region, stage 4 Z89.512 Acquired absence of left leg below knee Z89.511 Acquired absence of right leg below knee Follow-up Appointments ppointment in 2 weeks. - Dr. Celine Ahr - room 1 Return A Monday 3/25 @ 2:45 pm Anesthetic Wound #1 Sacrum (In clinic) Topical Lidocaine 4% applied to wound bed Bathing/ Shower/ Hygiene May shower and wash wound with soap and water. Off-Loading Gel mattress overlay (Group 1) Turn and reposition every 2 hours Wound Treatment Wound #1 - Sacrum Cleanser: Soap and Water 3 x Per Week/30 Days Discharge Instructions: May shower and wash wound with dial antibacterial soap and water prior to dressing change. Cleanser: Wound Cleanser 3 x Per Week/30 Days Discharge Instructions: Cleanse the wound with wound cleanser prior to applying a clean dressing using gauze sponges, not tissue or cotton balls. Peri-Wound Care: Skin Prep 3 x Per Week/30 Days Discharge Instructions: Use skin prep as directed Peri-Wound Care: Zinc Oxide Ointment 30g tube 3 x Per Week/30 Days Discharge Instructions: Apply Zinc Oxide to macerated periwound with each dressing change as needed Prim Dressing: Promogran Prisma Matrix, 4.34 (sq in) (silver collagen) 3 x Per Week/30 Days ary Discharge Instructions: Moisten collagen with saline and place in base or wound Secondary Dressing: Woven Gauze Sponges 2x2 in 3 x Per Week/30 Days Discharge Instructions: Moisten with saline and pack into wound lightly to hold collagen in place Secondary Dressing: Zetuvit Plus Silicone Border Sacrum Dressing, Sm, 7x7 (in/in) (Generic) 3 x Per Week/30 Days Discharge Instructions: Apply silicone border over primary dressing as directed. Secured With: 30M Medipore H Soft Cloth Surgical T ape, 4 x 10 (in/yd) (Generic) 3 x Per Week/30 Days Discharge Instructions: Secure with tape as  directed. Add-Ons: Gloves, Large 3 x Per Week/30 Days Electronic Signature(s) Signed: 11/17/2022 7:40:18 AM By: [name] MD FACS Entered  By: [name] on 11/16/2022 14:37:45 -------------------------------------------------------------------------------- Problem List Details Patient Name: Date of Service: [name] 11/16/2022 1:15 PM Medical Record Number: [id number] Patient Account Number: [id number] Date of Birth/Sex: Treating RN: Jan 08, 1966 (57 y.o. [name] Primary Care Provider: PCP, NO Other Clinician: Referring Provider: Treating Provider/Extender: [name] in Treatment: 44 Active Problems ICD-10 Encounter Code Description Active Date MDM Diagnosis L89.154 Pressure ulcer of sacral region, stage 4 01/08/2022 No Yes [name], [name] ([id number]) [id number] Page 5 of 10 [phone] Acquired absence of left leg below knee 01/08/2022 No Yes Z89.511 Acquired absence of right leg below knee 01/08/2022 No Yes Inactive Problems Resolved Problems Electronic Signature(s) Signed: 11/16/2022 2:36:03 PM By: [name] MD FACS Entered By: [name] on 11/16/2022 14:36:03 -------------------------------------------------------------------------------- Progress Note Details Patient Name: Date of Service: [name], [name] Spicer 11/16/2022 1:15 PM Medical Record Number: [id number] Patient Account Number: [id number] Date of Birth/Sex: Treating RN: 1966-08-27 (57 y.o. M) Primary Care Provider: PCP, NO Other Clinician: Referring Provider: Treating Provider/Extender: [name] in Treatment: 44 Subjective Chief Complaint Information obtained from Patient Patient is at the clinic for treatment of an open pressure ulcer History of Present Illness (HPI) ADMISSION 01/08/2022 This is a 57 year old male with a past medical history notable for type 2 diabetes mellitus (last A1c was 8.6)  congestive heart failure, hypertension, chronic pain, and bilateral below-knee amputations. His most recent amputation was in November 2022. While he was in the hospital, he developed a sacral pressure ulcer. He was subsequently in a skilled nursing facility for some time. He was discharged with home health and had been in a wound VAC, but was then admitted to the hospital last week when the wound appeared to be worsening. Apparently the periwound skin was macerated and the device that he had been using was leaking quite a bit. Evaluation while in the hospital included a consultation with infectious disease who did not think he had any evidence of osteomyelitis, plastic surgery who felt that he was not an appropriate flap candidate (their note also states that he is not interested in a flap) and wound care who took him out of the wound VAC and initiated wet-to-dry dressing changes. He has a new wound VAC from [facility] on order, anticipated delivery today. The wound itself is fairly small and isolated to the sacrum. There is muscle exposed. No bone is appreciated but it is palpable beneath the surface. The muscle itself is bit pale and there is heaped up epibole around the wound edges. No significant odor or drainage. 01/14/2022: His wound VAC was not initiated until this past Friday. He has not had any issues with the VAC but today we found that the bridge foam was applied directly to the skin rather than over a layer of adhesive drape. His home health nurse also requested that we consider applying silver collagen to the wound bed in addition to the VAC. There is still a little bit of heaped up senescent skin around the wound periphery. No significant change to the wound dimensions. 01/21/2022: No significant change to the wound dimensions. The senescent skin has not reaccumulated. The periwound skin remains a bit macerated but without any obvious breakdown. The wound surface itself has a shiny appearance  with a little bit of slough accumulation; no true robust granulation tissue at this time. 01/28/2022: The wound is about the same size but a little bit shallower. There is a little bit of senescent skin reaccumulation at  the cranial aspect. The periwound skin is red but not macerated and without any tissue breakdown. The wound still does not have the most robust surface. There is a bit of slough accumulation. 02/04/2022: The wound is a bit smaller and the undermining has come in somewhat. There continues to be granulation tissue formation within the wound bed. No significant slough accumulation and his periwound skin is in better condition. 02/18/2022: The wound stinks today. There is no obvious pus but the drainage and wound itself are malodorous. He continues to have heaped up tissue within the wound bed that is rather grayish and not particularly robust-appearing. He is very angry today with his situation. 02/25/2022: Last week, in response to the odor coming from the wound, I took a culture and prescribed topical gentamicin as well as oral cefdinir. Apparently [facility] contacted him about a topical compounded antibiotic, but he did not realize this and he hung up on them. Today, the odor is no longer present. There is some senescent skin around the wound margins as well as continued heaped up granulation tissue in the center of the wound. The undermining continues to contract. 03/04/2022: The wound continues to contract and look better. He still has heaped up hypertrophic granulation tissue near the orifice. No odor was coming from the wound today. He is awaiting [place] delivery. 03/18/2022: 2-week follow-up. [facility] topical antibiotic has been initiated. The chemical cauterization of the hypertrophic granulation tissue was quite successful and the surface is much flatter today. He has heaped up senescent skin around the perimeter. His home health providers have figured out a way to keep the wound  VAC from losing suction by bolstering with DuoDERM. Overall there has been significant improvement since our last visit. 04/01/2022: 2-week follow-up. The wound continues to contract. Once again, there is heaped up senescent skin around the perimeter. He has a little bit of skin breakdown in the distribution of the adhesive drape from the wound VAC. No odor or purulent drainage. No concern for infection. [name], [name] ([id number]) [id number] Page 6 of 10 04/15/2022: 2-week follow-up. He has developed a fairly substantial rash from the drape adhesive for his wound VAC. The periwound has a lot of heaped up epiboly at the margins. The tissue in the wound bed is a little bit pale but there is no odor or purulent drainage. 04/22/2022: Last week we discontinued the VAC. Today, the skin around his wound is in significantly improved condition. His rash is resolving. He does have some heaped up senescent skin around the wound margin and a layer of slough on the wound surface, but there is no concern for active infection. 04/29/2022: The wound continues to contract. The surface is clean. He continues to build up senescent skin around the wound edges that subsequently get a little bit macerated. No concern for infection. 05/06/2022: The wound is smaller again today in all dimensions. The surface is clean. He has his usual accumulation of macerated senescent skin around the wound edges. 05/13/2022: Continued wound contracture. The undermining has decreased quite a bit. Clean surface. Senescent skin heaped up around the wound edges, as per usual. 05/22/2022: The macerated senescent skin has accumulated once again. The wound dimensions are slightly smaller and the surface is clean. He has been having difficulty keeping the packing in the wound due to the large foam border dressing that he prefers. 06/03/2022: The wound is smaller and shallower with less undermining. He has built up macerated  senescent skin around the margins, as per usual.  He and his friend have figured out a way to make sure that his packing stays in the wound with his [place]. 06/12/2022: The wound is a little bit shallower again today. He still has accumulated senescent periwound skin, but otherwise the wound is clean. He will be undergoing bilateral recurrent inguinal hernia repair and umbilical hernia repair next week. 10/20 sacral pressure ulcer. using [facility] and backing wet to dry. Has developed a rash sur rounding the wound 07/03/2022: Continued contracture of the wound. It is very clean and there is no odor or purulent drainage. Continued buildup of senescent skin around the margin. 07/13/2022: There is less undermining present today. The orifice continues to contract. He continues to accumulate senescent skin around the perimeter. The rash around the wound has improved. 07/27/2022: The wound is smaller again today and the undermining is closed then. There is senescent skin accumulation, as usual. 08/07/2022: The wound continues to contract but has its usual accumulation of periwound senescent skin. Unfortunately, the periwound skin looks like it has gotten more moist. It is red and angry and the patient says it is more painful. 08/14/2022: The wound is smaller again this week. His periwound skin is in much better condition. There is still senescent skin around the wound margins and a bit of slough on the surface. 12/18; this is a lower sacral pressure ulcer. We have been using silver alginate for the last 2 weeks. This is longstanding and refractory. At 1 point I think had exposed bone he no longer has this now. He tells me that he has had problems with his right lower leg stump and he is going to see hangers she is therefore not walking but using his wheelchair although he says he is in his wheelchair "only 10% of the time]. Otherwise he claims to be rigorous with offloading this. 09/08/2022: The wound is  considerably smaller than the last time I saw it. He has, as usual, the senescent skin accumulation around the wound margin. The undermining and depth, as well as the overall dimensions have come in significantly. 09/21/2022: The wound dimensions are roughly the same, no significant accumulation of periwound senescent skin. There is some reaccumulation of hypertrophic granulation tissue on the wound bed, however. He is spending a bit more time in his wheelchair due to issues with his prosthetics, but he is hopeful that this will be resolved within the next week or so. 10/05/2022: The undermined portion of his wound has come in quite a bit. They are having difficulty getting the silver alginate to stay in place due to the decrease in wound size. He has accumulated senescent skin around the wound margins. 10/19/2022: The wound is down to just a couple of millimeters. 11/02/2022: No significant change in the wound dimensions, but there is no undermining present. No senescent skin accumulation. 11/06/2022: The wound is about a millimeter in diameter with a couple millimeters depth. It is clean. There is some callused skin around the opening. Patient History Information obtained from Patient, Caregiver, Chart. Family History Cancer - Father, Heart Disease - Father,Paternal Grandparents, Hypertension - Father, No family history of Diabetes, Hereditary Spherocytosis, Kidney Disease, Lung Disease, Seizures, Stroke, Thyroid Problems, Tuberculosis. Social History Never smoker, Marital Status - Divorced, Alcohol Use - Never, Drug Use - No History, Caffeine Use - Moderate - coffee. Medical History Eyes Denies history of Cataracts, Glaucoma, Optic Neuritis Ear/Nose/Mouth/Throat Denies history of Chronic sinus problems/congestion, Middle ear problems Cardiovascular Patient has history of Congestive Heart Failure, Hypertension Endocrine Patient  has history of Type II Diabetes Denies history of Type I  Diabetes Genitourinary Denies history of End Stage Renal Disease Integumentary (Skin) Denies history of History of Burn Musculoskeletal Patient has history of Osteomyelitis - S1 and coccyx Oncologic Denies history of Received Chemotherapy, Received Radiation Psychiatric [name], [name] ([id number]) [id number] Page 7 of 10 Patient has history of Confinement Anxiety Denies history of Anorexia/bulimia Hospitalization/Surgery History - bil BKA. Medical A Surgical History Notes nd Respiratory pulmonary nodules Genitourinary renal mass, urinary hesitancy Musculoskeletal discitis of thoracic region Objective Constitutional Hypertensive, asymptomatic. Slightly tachycardic. no acute distress. Vitals Time Taken: 1:11 AM, Height: 75 in, Weight: 220 lbs, BMI: 27.5, Temperature: 97.9 F, Pulse: 101 bpm, Respiratory Rate: 20 breaths/min, Blood Pressure: 146/98 mmHg, Capillary Blood Glucose: 132 mg/dl. General Notes: glucose per pt report this am Respiratory Normal work of breathing on room air. General Notes: 11/06/2022: The wound is about a millimeter in diameter with a couple millimeters depth. It is clean. There is some callused skin around the opening. Integumentary (Hair, Skin) Wound #1 status is Open. Original cause of wound was Pressure Injury. The date acquired was: 07/29/2021. The wound has been in treatment 44 weeks. The wound is located on the Sacrum. The wound measures 0.3cm length x 0.2cm width x 0.5cm depth; 0.047cm^2 area and 0.024cm^3 volume. There is Fat Layer (Subcutaneous Tissue) exposed. There is no tunneling or undermining noted. There is a small amount of serous drainage noted. The wound margin is epibole. There is no granulation within the wound bed. There is no necrotic tissue within the wound bed. The periwound skin appearance had no abnormalities noted for texture. The periwound skin appearance had no abnormalities noted for moisture. The  periwound skin appearance had no abnormalities noted for color. Periwound temperature was noted as No Abnormality. 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 There was a Selective/Open Wound Skin/Epidermis Debridement with a total area of 0.06 sq cm performed by [name], MD. With the following instrument(s): Curette to remove Non-Viable tissue/material. Material removed includes Callus and Skin: Epidermis and. No specimens were taken. A time out was conducted at 13:55, prior to the start of the procedure. A Minimum amount of bleeding was controlled with Pressure. The procedure was tolerated well with a pain level of 0 throughout and a pain level of 0 following the procedure. Post Debridement Measurements: 0.3cm length x 0.2cm width x 0.5cm depth; 0.024cm^3 volume. Post debridement Stage noted as Category/Stage IV. Character of Wound/Ulcer Post Debridement is stable. Post procedure Diagnosis Wound #1: Same as Pre-Procedure General Notes: Scribed for Dr. Celine Ahr by [name] Gouty RN. Plan Follow-up Appointments: Return Appointment in 2 weeks. - Dr. Celine Ahr - room 1 Monday 3/25 @ 2:45 pm Anesthetic: Wound #1 Sacrum: [name] ([id number]) [id number] Page 8 of 10 (In clinic) Topical Lidocaine 4% applied to wound bed Bathing/ Shower/ Hygiene: May shower and wash wound with soap and water. Off-Loading: Gel mattress overlay (Group 1) Turn and reposition every 2 hours WOUND #1: - Sacrum Wound Laterality: Cleanser: Soap and Water 3 x Per Week/30 Days Discharge Instructions: May shower and wash wound with dial antibacterial soap and water prior to dressing change. Cleanser: Wound Cleanser 3 x Per Week/30 Days Discharge Instructions: Cleanse  the wound with wound cleanser prior to applying a clean dressing using  gauze sponges, not tissue or cotton balls. Peri-Wound Care: Skin Prep 3 x Per Week/30 Days Discharge Instructions: Use skin prep as directed Peri-Wound Care: Zinc Oxide Ointment 30g tube 3 x Per Week/30 Days Discharge Instructions: Apply Zinc Oxide to macerated periwound with each dressing change as needed Prim Dressing: Promogran Prisma Matrix, 4.34 (sq in) (silver collagen) 3 x Per Week/30 Days ary Discharge Instructions: Moisten collagen with saline and place in base or wound Secondary Dressing: Woven Gauze Sponges 2x2 in 3 x Per Week/30 Days Discharge Instructions: Moisten with saline and pack into wound lightly to hold collagen in place Secondary Dressing: Zetuvit Plus Silicone Border Sacrum Dressing, Sm, 7x7 (in/in) (Generic) 3 x Per Week/30 Days Discharge Instructions: Apply silicone border over primary dressing as directed. Secured With: 22M Medipore H Soft Cloth Surgical T ape, 4 x 10 (in/yd) (Generic) 3 x Per Week/30 Days Discharge Instructions: Secure with tape as directed. Add-Ons: Gloves, Large 3 x Per Week/30 Days 11/06/2022: The wound is about a millimeter in diameter with a couple millimeters depth. It is clean. There is some callused skin around the opening. I used a curette to debride the callus skin from around the opening and to abrade the surface of the wound. We will pack the wound with Prisma silver collagen. He will follow-up in 2 weeks, at which time I anticipate the wound will likely be healed. Electronic Signature(s) Signed: 11/16/2022 2:38:39 PM By: [name] MD FACS Entered By: [name] on 11/16/2022 14:38:39 -------------------------------------------------------------------------------- HxROS Details Patient Name: Date of Service: [name], [name] 11/16/2022 1:15 PM Medical Record Number: [id number] Patient Account Number: [id number] Date of Birth/Sex: Treating RN: 1966-07-13 (57  y.o. M) Primary Care Provider: PCP, NO Other Clinician: Referring Provider: Treating Provider/Extender: [name] in Treatment: 44 Information Obtained From Patient Caregiver Chart Eyes Medical History: Negative for: Cataracts; Glaucoma; Optic Neuritis Ear/Nose/Mouth/Throat Medical History: Negative for: Chronic sinus problems/congestion; Middle ear problems Respiratory Medical History: Past Medical History Notes: pulmonary nodules Cardiovascular Medical History: Positive for: Congestive Heart Failure; Hypertension Endocrine Medical History: Positive for: Type II Diabetes Negative for: Type I Diabetes Time with diabetes: since 2004 [name], [name] ([id number]) [phone].pdf Page 9 of 10 Treated with: Insulin Blood sugar tested every day: Yes Tested : 4 times per day Genitourinary Medical History: Negative for: End Stage Renal Disease Past Medical History Notes: renal mass, urinary hesitancy Integumentary (Skin) Medical History: Negative for: History of Burn Musculoskeletal Medical History: Positive for: Osteomyelitis - S1 and coccyx Past Medical History Notes: discitis of thoracic region Oncologic Medical History: Negative for: Received Chemotherapy; Received Radiation Psychiatric Medical History: Positive for: Confinement Anxiety Negative for: Anorexia/bulimia Immunizations Pneumococcal Vaccine: Received Pneumococcal Vaccination: No Implantable Devices No devices added Hospitalization / Surgery History Type of Hospitalization/Surgery bil BKA Family and Social History Cancer: Yes - Father; Diabetes: No; Heart Disease: Yes - Father,Paternal Grandparents; Hereditary Spherocytosis: No; Hypertension: Yes - Father; Kidney Disease: No; Lung Disease: No; Seizures: No; Stroke: No; Thyroid Problems: No; Tuberculosis: No; Never smoker; Marital Status - Divorced; Alcohol Use: Never; Drug Use: No History; Caffeine  Use: Moderate - coffee; Financial Concerns: No; Food, Clothing or Shelter Needs: No; Support System Lacking: No; Transportation Concerns: Yes - hurts to transfer Electronic Signature(s) Signed: 11/17/2022 7:40:18 AM By: [name] MD FACS Entered By: [name] on 11/16/2022 14:37:12 -------------------------------------------------------------------------------- SuperBill Details Patient Name: Date of Service: [name], [name] 11/16/2022 Medical Record Number: [id number] Patient Account Number: [id number] Date of Birth/Sex: Treating RN: 05-26-1966 (56  y.o. M) Primary Care Provider: PCP, NO Other Clinician: Referring Provider: Treating Provider/Extender: [name] in Treatment: 44 Diagnosis Coding ICD-10 Codes Code Description L89.154 Pressure ulcer of sacral region, stage 4 Z89.512 Acquired absence of left leg below knee Z89.511 Acquired absence of right leg below knee [name], [name] ([id number]) [phone].pdf Page 10 of 10 Facility Procedures : CPT4 Code: [id number] Description: [phone] - DEBRIDE WOUND 1ST 20 SQ CM OR < ICD-10 Diagnosis Description L89.154 Pressure ulcer of sacral region, stage 4 Modifier: Quantity: 1 Physician Procedures : CPT4 Code Description Modifier [id number] - WC PHYS LEVEL 4 - EST PT 25 ICD-10 Diagnosis Description L89.154 Pressure ulcer of sacral region, stage 4 Z89.512 Acquired absence of left leg below knee Z89.511 Acquired absence of right leg below knee Quantity: 1 : [id number] - WC PHYS DEBR WO ANESTH 20 SQ CM ICD-10 Diagnosis Description L89.154 Pressure ulcer of sacral region, stage 4 Quantity: 1 Electronic Signature(s) Signed: 11/16/2022 2:38:54 PM By: [name] MD FACS Entered By: [name] on 11/16/2022 14:38:53

## 2022-11-17 NOTE — Progress Notes (Signed)
[name] ([id number]) [id number] Page 1 of 7 Visit Report for 11/16/2022 Arrival Information Details Patient Name: Date of Service: [name] 11/16/2022 1:15 PM Medical Record Number: [id number] Patient Account Number: [id number] Date of Birth/Sex: Treating RN: 05/21/1966 (57 y.o. M) Primary Care Lyric Rossano: PCP, NO Other Clinician: Referring Allecia Bells: Treating Thorsten Climer/Extender: Lannie Fields in Treatment: 44 Visit Information History Since Last Visit All ordered tests and consults were completed: No Patient Arrived: Wheel Chair Added or deleted any medications: No Arrival Time: 13:10 Any new allergies or adverse reactions: No Accompanied By: wife Had a fall or experienced change in No Transfer Assistance: None activities of daily living that may affect Patient Identification Verified: Yes risk of falls: Secondary Verification Process Completed: Yes Signs or symptoms of abuse/neglect since last visito No Patient Requires Transmission-Based Precautions: No Hospitalized since last visit: No Patient Has Alerts: No Implantable device outside of the clinic excluding No cellular tissue based products placed in the center since last visit: Has Dressing in Place as Prescribed: Yes Pain Present Now: Yes Electronic Signature(s) Signed: 11/16/2022 5:44:44 PM By: Baruch Gouty RN, BSN Entered By: Baruch Gouty on 11/16/2022 13:40:03 -------------------------------------------------------------------------------- Encounter Discharge Information Details Patient Name: Date of Service: [name], [name] 11/16/2022 1:15 PM Medical Record Number: [id number] Patient Account Number: [id number] Date of Birth/Sex: Treating RN: 01-Jun-1966 (57 y.o. [name] Primary Care Lou Irigoyen: PCP, NO Other Clinician: Referring Amaiah Cristiano: Treating Ithzel Fedorchak/Extender: Lannie Fields in Treatment: 44 Encounter  Discharge Information Items Post Procedure Vitals Discharge Condition: Stable Temperature (F): 97.9 Ambulatory Status: Wheelchair Pulse (bpm): 101 Discharge Destination: Home Respiratory Rate (breaths/min): 18 Transportation: Private Auto Blood Pressure (mmHg): 146/98 Accompanied By: friend Schedule Follow-up Appointment: Yes Clinical Summary of Care: Patient Declined Electronic Signature(s) Signed: 11/16/2022 5:44:44 PM By: Baruch Gouty RN, BSN Entered By: Baruch Gouty on 11/16/2022 14:04:30 -------------------------------------------------------------------------------- Lower Extremity Assessment Details Patient Name: Date of Service: [name] 11/16/2022 1:15 PM Medical Record Number: [id number] Patient Account Number: [id number] Date of Birth/Sex: Treating RN: 1966-07-02 (57 y.o. [name] Primary Care Nishka Heide: PCP, NO Other Clinician: Referring Leonette Tischer: Treating Chennel Olivos/Extender: Lannie Fields in Treatment: 44 Electronic Signature(s) Signed: 11/16/2022 5:44:44 PM By: Baruch Gouty RN, BSN [name], [name] [id number] By: Baruch Gouty RN, BSN [phone].pdf Page 2 of 7 Signed: 11/16/2022 5:44:44 Entered By: Baruch Gouty on 11/16/2022 13:41:04 -------------------------------------------------------------------------------- Multi Wound Chart Details Patient Name: Date of Service: [name] 11/16/2022 1:15 PM Medical Record Number: [id number] Patient Account Number: [id number] Date of Birth/Sex: Treating RN: [birth year] (57 y.o. M) Primary Care Tashonna Descoteaux: PCP, NO Other Clinician: Referring Akiah Bauch: Treating Damante Spragg/Extender: Lannie Fields in Treatment: 44 Vital Signs Height(in): 75 Capillary Blood Glucose(mg/dl): 132 Weight(lbs): 220 Pulse(bpm): 101 Body Mass Index(BMI): 27.5 Blood Pressure(mmHg): 146/98 Temperature(F): 97.9 Respiratory Rate(breaths/min):  20 [1:Photos:] [N/A:N/A] Sacrum N/A N/A Wound Location: Pressure Injury N/A N/A Wounding Event: Pressure Ulcer N/A N/A Primary Etiology: Congestive Heart Failure, N/A N/A Comorbid History: Hypertension, Type II Diabetes, Osteomyelitis, Confinement Anxiety 07/29/2021 N/A N/A Date Acquired: 16 N/A N/A Weeks of Treatment: Open N/A N/A Wound Status: No N/A N/A Wound Recurrence: 0.3x0.2x0.5 N/A N/A Measurements L x W x D (cm) 0.047 N/A N/A A (cm) : rea 0.024 N/A N/A Volume (cm) : 99.20% N/A N/A % Reduction in A rea: 99.60% N/A N/A % Reduction in Volume: Category/Stage IV N/A N/A Classification: Small N/A N/A Exudate A mount: Serous N/A N/A Exudate Type: amber N/A N/A Exudate  Color: Epibole N/A N/A Wound Margin: None Present (0%) N/A N/A Granulation A mount: None Present (0%) N/A N/A Necrotic A mount: Fat Layer (Subcutaneous Tissue): Yes N/A N/A Exposed Structures: Fascia: No Tendon: No Muscle: No Joint: No Bone: No Large (67-100%) N/A N/A Epithelialization: Debridement - Selective/Open Wound N/A N/A Debridement: Pre-procedure Verification/Time Out 13:55 N/A N/A Taken: Callus N/A N/A Tissue Debrided: Skin/Epidermis N/A N/A Level: 0.06 N/A N/A Debridement A (sq cm): rea Curette N/A N/A Instrument: Minimum N/A N/A Bleeding: Pressure N/A N/A Hemostasis A chieved: 0 N/A N/A Procedural Pain: 0 N/A N/A Post Procedural Pain: Procedure was tolerated well N/A N/A Debridement Treatment Response: 0.3x0.2x0.5 N/A N/A Post Debridement Measurements L x W x D (cm) 0.024 N/A N/A Post Debridement Volume: (cm) Category/Stage IV N/A N/A Post Debridement Stage: Excoriation: No N/A N/A Periwound Skin Texture: Callus: No Rash: No [name], [name] ([id number]) [phone].pdf Page 3 of 7 Maceration: No N/A N/A Periwound Skin Moisture: Dry/Scaly: No No Abnormalities Noted N/A N/A Periwound Skin Color: No Abnormality N/A  N/A Temperature: Debridement N/A N/A Procedures Performed: Treatment Notes Wound #1 (Sacrum) Cleanser Soap and Water Discharge Instruction: May shower and wash wound with dial antibacterial soap and water prior to dressing change. Wound Cleanser Discharge Instruction: Cleanse the wound with wound cleanser prior to applying a clean dressing using gauze sponges, not tissue or cotton balls. Peri-Wound Care Skin Prep Discharge Instruction: Use skin prep as directed Zinc Oxide Ointment 30g tube Discharge Instruction: Apply Zinc Oxide to macerated periwound with each dressing change as needed Topical Primary Dressing Promogran Prisma Matrix, 4.34 (sq in) (silver collagen) Discharge Instruction: Moisten collagen with saline and place in base or wound Secondary Dressing Woven Gauze Sponges 2x2 in Discharge Instruction: Moisten with saline and pack into wound lightly to hold collagen in place Zetuvit Plus Silicone Border Sacrum Dressing, Sm, 7x7 (in/in) Discharge Instruction: Apply silicone border over primary dressing as directed. Secured With 108M Medipore H Soft Cloth Surgical T ape, 4 x 10 (in/yd) Discharge Instruction: Secure with tape as directed. Compression Wrap Compression Stockings Add-Ons Gloves, Large Electronic Signature(s) Signed: 11/16/2022 2:36:11 PM By: [name] MD FACS Entered By: [name] on 11/16/2022 14:36:11 -------------------------------------------------------------------------------- Multi-Disciplinary Care Plan Details Patient Name: Date of Service: [name], [name] 11/16/2022 1:15 PM Medical Record Number: [id number] Patient Account Number: [id number] Date of Birth/Sex: Treating RN: Aug 11, 1966 (57 y.o. [name] Primary Care Laisa Larrick: PCP, NO Other Clinician: Referring Siddhi Dornbush: Treating Mathews Stuhr/Extender: Lannie Fields in Treatment: [place] reviewed with physician Active  Inactive Nutrition Nursing Diagnoses: Impaired glucose control: actual or potential Potential for alteratiion in Nutrition/Potential for imbalanced nutrition GoalsDAYVEON, BULMAN ([id number]) [phone].pdf Page 4 of 7 Patient/caregiver will maintain therapeutic glucose control Date Initiated: 01/08/2022 Target Resolution Date: 11/30/2022 Goal Status: Active Interventions: Assess HgA1c results as ordered upon admission and as needed Assess patient nutrition upon admission and as needed per policy Provide education on elevated blood sugars and impact on wound healing Treatment Activities: Dietary management education, guidance and counseling : 01/08/2022 Patient referred to Primary Care Physician for further nutritional evaluation : 01/08/2022 Notes: Pressure Nursing Diagnoses: Knowledge deficit related to causes and risk factors for pressure ulcer development Knowledge deficit related to management of pressures ulcers Potential for impaired tissue integrity related to pressure, friction, moisture, and shear Goals: Patient/caregiver will verbalize understanding of pressure ulcer management Date Initiated: 01/08/2022 Target Resolution Date: 11/30/2022 Goal Status: Active Interventions: Assess: immobility, friction, shearing, incontinence upon admission and as needed  Assess offloading mechanisms upon admission and as needed Assess potential for pressure ulcer upon admission and as needed Notes: Wound/Skin Impairment Nursing Diagnoses: Impaired tissue integrity Knowledge deficit related to ulceration/compromised skin integrity Goals: Patient/caregiver will verbalize understanding of skin care regimen Date Initiated: 01/08/2022 Target Resolution Date: 11/30/2022 Goal Status: Active Ulcer/skin breakdown will have a volume reduction of 30% by week 4 Date Initiated: 01/08/2022 Date Inactivated: 02/18/2022 Target Resolution Date: 02/05/2022 Goal Status: Unmet Unmet  Reason: VAC leaking Ulcer/skin breakdown will have a volume reduction of 50% by week 8 Date Initiated: 02/18/2022 Date Inactivated: 03/04/2022 Target Resolution Date: 03/05/2022 Goal Status: Unmet Unmet Reason: infection Ulcer/skin breakdown will have a volume reduction of 80% by week 12 Date Initiated: 03/04/2022 Date Inactivated: 04/01/2022 Target Resolution Date: 04/02/2022 Goal Status: Unmet Unmet Reason: too much moisture Interventions: Assess patient/caregiver ability to obtain necessary supplies Assess patient/caregiver ability to perform ulcer/skin care regimen upon admission and as needed Assess ulceration(s) every visit Provide education on ulcer and skin care Treatment Activities: Skin care regimen initiated : 01/08/2022 Topical wound management initiated : 01/08/2022 Notes: Electronic Signature(s) Signed: 11/16/2022 5:44:44 PM By: Baruch Gouty RN, BSN Entered By: Baruch Gouty on 11/16/2022 13:49:04 -------------------------------------------------------------------------------- Pain Assessment Details Patient Name: Date of Service: [name], [name] 11/16/2022 1:15 PM [name] ([id number].pdf Page 5 of 7 Medical Record Number: [id number] Patient Account Number: [id number] Date of Birth/Sex: Treating RN: 07/16/1966 (57 y.o. M) Primary Care Santonio Speakman: PCP, NO Other Clinician: Referring Glennie Rodda: Treating Tavarius Grewe/Extender: Lannie Fields in Treatment: 44 Active Problems Location of Pain Severity and Description of Pain Patient Has Paino Yes Site Locations Pain Location: Generalized Pain, Pain in Ulcers With Dressing Change: No Duration of the Pain. Constant / Intermittento Constant Rate the pain. Current Pain Level: 7 Worst Pain Level: 10 Least Pain Level: 0 Tolerable Pain Level: 4 Character of Pain Describe the Pain: Aching Pain Management and Medication Current Pain Management: Medication:  Yes Is the Current Pain Management Adequate: Adequate How does your wound impact your activities of daily livingo Sleep: Yes Bathing: No Appetite: No Relationship With Others: No Bladder Continence: No Emotions: Yes Bowel Continence: No Work: No Toileting: No Drive: No Dressing: No Hobbies: No Electronic Signature(s) Signed: 11/16/2022 5:44:44 PM By: Baruch Gouty RN, BSN Entered By: Baruch Gouty on 11/16/2022 13:40:58 -------------------------------------------------------------------------------- Patient/Caregiver Education Details Patient Name: Date of Service: [name], [name] 3/11/2024andnbsp1:15 PM Medical Record Number: [id number] Patient Account Number: [id number] Date of Birth/Gender: Treating RN: 01-11-1966 (57 y.o. [name] Primary Care Physician: PCP, NO Other Clinician: Referring Physician: Treating Physician/Extender: Lannie Fields in Treatment: 26 Education Assessment Education Provided To: Patient Education Topics Provided Pressure: Methods: Explain/Verbal Responses: Reinforcements needed, State content correctly Wound/Skin Impairment: Methods: Explain/Verbal BRAIJON, GIFFEN ([id number]) [id number] Page 6 of 7 Responses: Reinforcements needed, State content correctly Electronic Signature(s) Signed: 11/16/2022 5:44:44 PM By: Baruch Gouty RN, BSN Entered By: Baruch Gouty on 11/16/2022 13:49:27 -------------------------------------------------------------------------------- Wound Assessment Details Patient Name: Date of Service: [name], [name] 11/16/2022 1:15 PM Medical Record Number: [id number] Patient Account Number: [id number] Date of Birth/Sex: Treating RN: 1966/02/04 (57 y.o. M) Primary Care Osiel Stick: PCP, NO Other Clinician: Referring Airis Barbee: Treating Patina Spanier/Extender: Lannie Fields in Treatment: 42 Wound Status Wound Number: 1 Primary Pressure  Ulcer Etiology: Wound Location: Sacrum Wound Open Wounding Event: Pressure Injury Status: Date Acquired: 07/29/2021 Comorbid Congestive Heart Failure, Hypertension, Type II Diabetes, Weeks Of Treatment: 44 History: Osteomyelitis, Confinement Anxiety Clustered Wound: No  Photos Wound Measurements Length: (cm) 0.3 Width: (cm) 0.2 Depth: (cm) 0.5 Area: (cm) 0.047 Volume: (cm) 0.024 % Reduction in Area: 99.2% % Reduction in Volume: 99.6% Epithelialization: Large (67-100%) Tunneling: No Undermining: No Wound Description Classification: Category/Stage IV Wound Margin: Epibole Exudate Amount: Small Exudate Type: Serous Exudate Color: amber Foul Odor After Cleansing: No Slough/Fibrino No Wound Bed Granulation Amount: None Present (0%) Exposed Structure Necrotic Amount: None Present (0%) Fascia Exposed: No Fat Layer (Subcutaneous Tissue) Exposed: Yes Tendon Exposed: No Muscle Exposed: No Joint Exposed: No Bone Exposed: No Periwound Skin Texture Texture Color No Abnormalities Noted: Yes No Abnormalities Noted: Yes Moisture Temperature / Pain No Abnormalities Noted: Yes Temperature: No Abnormality Treatment Notes MCCRAY, ANGERMEIER ([id number]) [id number] Page 7 of 7 Wound #1 (Sacrum) Cleanser Soap and Water Discharge Instruction: May shower and wash wound with dial antibacterial soap and water prior to dressing change. Wound Cleanser Discharge Instruction: Cleanse the wound with wound cleanser prior to applying a clean dressing using gauze sponges, not tissue or cotton balls. Peri-Wound Care Skin Prep Discharge Instruction: Use skin prep as directed Zinc Oxide Ointment 30g tube Discharge Instruction: Apply Zinc Oxide to macerated periwound with each dressing change as needed Topical Primary Dressing Promogran Prisma Matrix, 4.34 (sq in) (silver collagen) Discharge Instruction: Moisten collagen with saline and place in base or  wound Secondary Dressing Woven Gauze Sponges 2x2 in Discharge Instruction: Moisten with saline and pack into wound lightly to hold collagen in place Zetuvit Plus Silicone Border Sacrum Dressing, Sm, 7x7 (in/in) Discharge Instruction: Apply silicone border over primary dressing as directed. Secured With 54M Medipore H Soft Cloth Surgical T ape, 4 x 10 (in/yd) Discharge Instruction: Secure with tape as directed. Compression Wrap Compression Stockings Add-Ons Gloves, Art gallery manager) Signed: 11/16/2022 5:44:44 PM By: Baruch Gouty RN, BSN Entered By: Baruch Gouty on 11/16/2022 13:51:42 -------------------------------------------------------------------------------- Vitals Details Patient Name: Date of Service: [name], [name] 11/16/2022 1:15 PM Medical Record Number: [id number] Patient Account Number: [id number] Date of Birth/Sex: Treating RN: 04-12-1966 (57 y.o. M) Primary Care Abbigal Radich: PCP, NO Other Clinician: Referring Solomon Skowronek: Treating Loraina Stauffer/Extender: Lannie Fields in Treatment: 44 Vital Signs Time Taken: 01:11 Temperature (F): 97.9 Height (in): 75 Pulse (bpm): 101 Weight (lbs): 220 Respiratory Rate (breaths/min): 20 Body Mass Index (BMI): 27.5 Blood Pressure (mmHg): 146/98 Capillary Blood Glucose (mg/dl): 132 Reference Range: 80 - 120 mg / dl Notes glucose per pt report this am Electronic Signature(s) Signed: 11/16/2022 5:44:44 PM By: Baruch Gouty RN, BSN Entered By: Baruch Gouty on 11/16/2022 13:40:23

## 2022-11-30 ENCOUNTER — Encounter (HOSPITAL_BASED_OUTPATIENT_CLINIC_OR_DEPARTMENT_OTHER): Payer: Medicare HMO | Admitting: General Surgery

## 2022-11-30 ENCOUNTER — Encounter: Payer: Self-pay | Admitting: Orthopedic Surgery

## 2022-11-30 DIAGNOSIS — L89154 Pressure ulcer of sacral region, stage 4: Secondary | ICD-10-CM | POA: Diagnosis not present

## 2022-11-30 DIAGNOSIS — Z89512 Acquired absence of left leg below knee: Secondary | ICD-10-CM | POA: Diagnosis not present

## 2022-11-30 DIAGNOSIS — I110 Hypertensive heart disease with heart failure: Secondary | ICD-10-CM | POA: Diagnosis not present

## 2022-11-30 DIAGNOSIS — I509 Heart failure, unspecified: Secondary | ICD-10-CM | POA: Diagnosis not present

## 2022-11-30 DIAGNOSIS — Z89511 Acquired absence of right leg below knee: Secondary | ICD-10-CM | POA: Diagnosis not present

## 2022-11-30 DIAGNOSIS — E119 Type 2 diabetes mellitus without complications: Secondary | ICD-10-CM | POA: Diagnosis not present

## 2022-11-30 NOTE — Progress Notes (Signed)
Office Visit Note   Patient: [name]           Date of Birth: [birth year]           MRN: [id number] Visit Date: 11/10/2022              Requested by: Loralee Pacas, [address] [place],  [place] [address] PCP: Pcp, No  Chief Complaint  Patient presents with   Right Leg - Follow-up    Hx bilateral BKA    Left Leg - Follow-up      HPI: Patient is a 57 year old gentleman who presents for evaluation for bilateral below-knee amputations.  Patient states that he is subsiding into his socket and has developed ulceration over the patella bilaterally.  Assessment & Plan: Visit Diagnoses:  1. S/P bilateral below knee amputation ([place])     Plan: Patient was provided a prescription for new sockets for both lower extremities.  Patient's current sockets were fabricated May of last year.  He has new liners that he obtained last week that have not provided sufficient pressure offloading.  Patient ideally would benefit from a vacuum socket.  Follow-Up Instructions: No follow-ups on file.   Ortho Exam  Patient is alert, oriented, no adenopathy, well-dressed, normal affect, normal respiratory effort. Examination patient has redness over the patella bilaterally.  He is subsiding into his socket no open ulcers.  Patient is an existing bilateral transtibial  amputee.  Patient's current comorbidities are not expected to impact the ability to function with the prescribed prosthesis. Patient verbally communicates a strong desire to use a prosthesis. Patient currently requires mobility aids to ambulate without a prosthesis.  Expects not to use mobility aids with a new prosthesis.  Patient is a K3 level ambulator that spends a lot of time walking around on uneven terrain over obstacles, up and down stairs, and ambulates with a variable cadence.     Imaging: No results found. No images are attached to the encounter.  Labs: Lab Results  Component Value Date   HGBA1C 11.9  (H) 06/15/2022   HGBA1C 8.6 (A) 12/05/2021   HGBA1C 8.6 12/05/2021   HGBA1C 8.6 (A) 12/05/2021   HGBA1C 8.6 (A) 12/05/2021   ESRSEDRATE 25 (H) 07/03/2022   ESRSEDRATE 11 12/30/2021   ESRSEDRATE 12 12/29/2021   CRP <1.0 07/03/2022   CRP 0.7 12/30/2021   CRP 1.1 (H) 12/29/2021   REPTSTATUS 04/11/2022 FINAL 04/09/2022   CULT >=100,000 COLONIES/mL PSEUDOMONAS AERUGINOSA (A) 04/09/2022   LABORGA PSEUDOMONAS AERUGINOSA (A) 04/09/2022     Lab Results  Component Value Date   ALBUMIN 4.0 07/03/2022   ALBUMIN 3.2 (L) 12/28/2021   ALBUMIN 2.4 (L) 12/16/2021    Lab Results  Component Value Date   MG 1.7 12/18/2021   MG 1.4 (L) 12/16/2021   MG 1.5 (L) 08/15/2021   No results found for: "VD25OH"  No results found for: "PREALBUMIN"    Latest Ref Rng & Units 07/03/2022    1:38 PM 06/15/2022    2:32 PM 12/31/2021    1:11 AM  CBC EXTENDED  WBC 4.0 - 10.5 K/uL 4.6  5.4  8.3   RBC 4.22 - 5.81 Mil/uL 5.80  6.05  4.82   Hemoglobin 13.0 - 17.0 g/dL 17.7  18.6  14.3   HCT 39.0 - 52.0 % 53.3  55.1  42.8   Platelets 150.0 - 400.0 K/uL 179.0  194  253      There is no height or weight on  file to calculate BMI.  Orders:  No orders of the defined types were placed in this encounter.  No orders of the defined types were placed in this encounter.    Procedures: No procedures performed  Clinical Data: No additional findings.  ROS:  All other systems negative, except as noted in the HPI. Review of Systems  Objective: Vital Signs: There were no vitals taken for this visit.  Specialty Comments:  No specialty comments available.  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[place]) 07/24/2022   Type 1 diabetes mellitus with complications ([place]) 123456   Malaise 06/22/2022   Polycythemia 06/22/2022   Amputation below knee ([place]) 04/23/2022   PTSD (post-traumatic stress disorder) 04/23/2022   Generalized anxiety disorder  01/21/2022   Episode of unresponsiveness 12/15/2021   Acute respiratory failure with hypoxia (HCC) secondary to suspected aspiration pneumonia 12/15/2021   Renal mass 12/15/2021   Stage IV pressure ulcer of sacral region ([place]) 09/15/2021   Insomnia 123456   Chronic systolic CHF (congestive heart failure) ([place]) 09/14/2021   Hypogonadism in male 09/14/2021   Chronic pain 09/14/2021   Pseudohyponatremia 09/13/2021   Normocytic anemia 09/13/2021   Discitis of thoracic region    Paroxysmal atrial flutter (HCC) 07/22/2021   Chronic diastolic (congestive) heart failure ([place]) 05/16/2021   Lumbar spondylosis 03/25/2021   H/O amputation of leg through tibia and fibula ([place]) 11/10/2019   Degeneration of lumbar intervertebral disc 11/10/2019   Hypercholesterolemia 11/10/2019   Left below-knee amputee ([place]) 10/30/2019   Dyslipidemia 10/30/2019   Type 1 diabetes mellitus with diabetic polyneuropathy, with long-term current use of insulin ([place]) 10/12/2019   Right below-elbow amputee 10/12/2019   Hypertensive disorder 09/25/2019   Urinary hesitancy 09/25/2019   Prosthesis adjustments 09/25/2019   Erectile dysfunction 09/25/2019   Palliative care status 09/25/2019   Pulmonary nodules 09/25/2019   Depressive disorder 09/25/2019   Past Medical History:  Diagnosis Date   Acquired complex renal cyst 09/25/2019   AKI (acute kidney injury) ([place]) 07/22/2021   Cancer ([place])    renal   Chest pain [id number]   Complication of anesthesia    Decubitus ulcer of sacral area 12/15/2021   Depression    Diabetes mellitus without complication ([place])    Diabetic infection of left foot ([place]) 04/17/2021   Episode of unresponsiveness 12/15/2021   Gas gangrene of foot ([place]) 07/22/2021   Healthcare maintenance 09/25/2019   Hematuria 09/25/2019   History of kidney stones    Lactic acidosis 12/15/2021   Leukocytosis 09/13/2021   Osteomyelitis (HCC)    Sacral pressure injury of skin 07/27/2021   Septic  shock ([place]) 07/22/2021   Severe sepsis with acute organ dysfunction ([place]) 07/22/2021   Shortness of breath 05/16/2021   Stage 3a chronic kidney disease ([place]) [id number]   Systolic dysfunction    Type 2 diabetes mellitus 10/12/2019    Family History  Problem Relation Age of Onset   Anxiety disorder Mother    Cancer Father    Coronary artery disease Father     Past Surgical History:  Procedure Laterality Date   AMPUTATION Left 07/23/2021   Procedure: LEFT BELOW KNEE AMPUTATION;  Surgeon: [name], MD;  Location: [facility];  Service: Orthopedics;  Laterality: Left;   BELOW KNEE LEG AMPUTATION Right    BUBBLE STUDY  07/29/2021   Procedure: BUBBLE STUDY;  Surgeon: Sueanne Margarita, MD;  Location: [facility];  Service: Cardiovascular;;   CARDIOVERSION N/A 07/29/2021  Procedure: CARDIOVERSION;  Surgeon: Sueanne Margarita, MD;  Location: [facility] ENDOSCOPY;  Service: Cardiovascular;  Laterality: N/A;   RADIOLOGY WITH ANESTHESIA N/A 08/05/2021   Procedure: MRI LUMBAR WITH AND WITHOUT; THORASIC SPINE WITH AND WITHOUT WITH ANESTHESIA;  Surgeon: Radiologist, Medication, MD;  Location: [facility];  Service: Radiology;  Laterality: N/A;   RADIOLOGY WITH ANESTHESIA N/A 08/07/2021   Procedure: MRI WITH LUMBER WITH AND WITHOUT CONTRAST,THORACIC WITH AND WITHOUT CONTRAST;  Surgeon: Radiologist, Medication, MD;  Location: [facility];  Service: Radiology;  Laterality: N/A;   RADIOLOGY WITH ANESTHESIA N/A 09/13/2021   Procedure: MRI WITH ANESTHESIA;  Surgeon: Luanne Bras, MD;  Location: [facility];  Service: Radiology;  Laterality: N/A;   TEE WITHOUT CARDIOVERSION N/A 07/29/2021   Procedure: TRANSESOPHAGEAL ECHOCARDIOGRAM (TEE);  Surgeon: Sueanne Margarita, MD;  Location: [facility] ENDOSCOPY;  Service: Cardiovascular;  Laterality: N/A;   Social History   Occupational History   Not on file  Tobacco Use   Smoking status: Former    Types: Cigarettes   Smokeless tobacco: Current    Types: Chew  Vaping Use   Vaping Use:  Never used  Substance and Sexual Activity   Alcohol use: Not Currently   Drug use: Never   Sexual activity: Not Currently

## 2022-11-30 NOTE — Progress Notes (Signed)
PAYDEN, VALLELY ([id number]) [id number] Page 1 of 7 Visit Report for 11/30/2022 Arrival Information Details Patient Name: Date of Service: [place], [name] 11/30/2022 1:45 PM Medical Record Number: [id number] Patient Account Number: [id number] Date of Birth/Sex: Treating RN: 15-Nov-1965 (57 y.o. [name] Primary Care Enza Shone: PCP, NO Other Clinician: Referring Trafton Roker: Treating Najah Liverman/Extender: Lannie Fields in Treatment: 46 Visit Information History Since Last Visit Added or deleted any medications: No Patient Arrived: Wheel Chair Any new allergies or adverse reactions: No Arrival Time: 13:48 Had a fall or experienced change in No Accompanied By: wife activities of daily living that may affect Transfer Assistance: Manual risk of falls: Patient Identification Verified: Yes Signs or symptoms of abuse/neglect since last visito No Secondary Verification Process Completed: Yes Hospitalized since last visit: No Patient Requires Transmission-Based Precautions: No Implantable device outside of the clinic excluding No Patient Has Alerts: No cellular tissue based products placed in the center since last visit: Has Dressing in Place as Prescribed: Yes Pain Present Now: Yes Electronic Signature(s) Signed: 11/30/2022 4:39:46 PM By: Adline Peals Entered By: Adline Peals on 11/30/2022 13:50:10 -------------------------------------------------------------------------------- Clinic Level of Care Assessment Details Patient Name: Date of ServiceGilman Schmidt 11/30/2022 1:45 PM Medical Record Number: [id number] Patient Account Number: [id number] Date of Birth/Sex: Treating RN: [birth year] (57 y.o. [name] Primary Care Terriyah Westra: PCP, NO Other Clinician: Referring Karrina Lye: Treating Ronnett Pullin/Extender: Lannie Fields in Treatment: 46 Clinic Level of Care Assessment Items TOOL 4  Quantity Score X- 1 0 Use when only an EandM is performed on FOLLOW-UP visit ASSESSMENTS - Nursing Assessment / Reassessment []  - 0 Reassessment of Co-morbidities (includes updates in patient status) []  - 0 Reassessment of Adherence to Treatment Plan ASSESSMENTS - Wound and Skin A ssessment / Reassessment X - Simple Wound Assessment / Reassessment - one wound 1 5 []  - 0 Complex Wound Assessment / Reassessment - multiple wounds []  - 0 Dermatologic / Skin Assessment (not related to wound area) ASSESSMENTS - Focused Assessment []  - 0 Circumferential Edema Measurements - multi extremities []  - 0 Nutritional Assessment / Counseling / Intervention []  - 0 Lower Extremity Assessment (monofilament, tuning fork, pulses) []  - 0 Peripheral Arterial Disease Assessment (using hand held doppler) ASSESSMENTS - Ostomy and/or Continence Assessment and Care []  - 0 Incontinence Assessment and Management []  - 0 Ostomy Care Assessment and Management (repouching, etc.) PROCESS - Coordination of Care X - Simple Patient / Family Education for ongoing care 1 15 [place], [place] ([id number]) [phone].pdf Page 2 of 7 []  - 0 Complex (extensive) Patient / Family Education for ongoing care X- 1 10 Staff obtains Programmer, systems, Records, T Results / Process Orders est []  - 0 Staff telephones HHA, Nursing Homes / Clarify orders / etc []  - 0 Routine Transfer to another Facility (non-emergent condition) []  - 0 Routine Hospital Admission (non-emergent condition) []  - 0 New Admissions / Biomedical engineer / Ordering NPWT Apligraf, etc. , []  - 0 Emergency Hospital Admission (emergent condition) X- 1 10 Simple Discharge Coordination []  - 0 Complex (extensive) Discharge Coordination PROCESS - Special Needs []  - 0 Pediatric / Minor Patient Management []  - 0 Isolation Patient Management []  - 0 Hearing / Language / Visual special needs []  - 0 Assessment of Community assistance  (transportation, D/C planning, etc.) []  - 0 Additional assistance / Altered mentation []  - 0 Support Surface(s) Assessment (bed, cushion, seat, etc.) INTERVENTIONS - Wound Cleansing / Measurement X - Simple Wound Cleansing - one wound  1 5 []  - 0 Complex Wound Cleansing - multiple wounds X- 1 5 Wound Imaging (photographs - any number of wounds) []  - 0 Wound Tracing (instead of photographs) []  - 0 Simple Wound Measurement - one wound []  - 0 Complex Wound Measurement - multiple wounds INTERVENTIONS - Wound Dressings []  - 0 Small Wound Dressing one or multiple wounds []  - 0 Medium Wound Dressing one or multiple wounds []  - 0 Large Wound Dressing one or multiple wounds []  - 0 Application of Medications - topical []  - 0 Application of Medications - injection INTERVENTIONS - Miscellaneous []  - 0 External ear exam []  - 0 Specimen Collection (cultures, biopsies, blood, body fluids, etc.) []  - 0 Specimen(s) / Culture(s) sent or taken to Lab for analysis []  - 0 Patient Transfer (multiple staff / Civil Service fast streamer / Similar devices) []  - 0 Simple Staple / Suture removal (25 or less) []  - 0 Complex Staple / Suture removal (26 or more) []  - 0 Hypo / Hyperglycemic Management (close monitor of Blood Glucose) []  - 0 Ankle / Brachial Index (ABI) - do not check if billed separately X- 1 5 Vital Signs Has the patient been seen at the hospital within the last three years: Yes Total Score: 55 Level Of Care: New/Established - Level 2 Electronic Signature(s) Signed: 11/30/2022 4:39:46 PM By: Adline Peals Entered By: Adline Peals on 11/30/2022 14:16:54 [name] ([id number].pdf Page 3 of 7 -------------------------------------------------------------------------------- Encounter Discharge Information Details Patient Name: Date of Service: [name], [name] 11/30/2022 1:45 PM Medical Record Number: [id number] Patient Account Number:  [id number] Date of Birth/Sex: Treating RN: 1966-07-16 (57 y.o. [name] Primary Care Dao Memmott: PCP, NO Other Clinician: Referring Timiyah Romito: Treating Tamy Accardo/Extender: Lannie Fields in Treatment: 46 Encounter Discharge Information Items Discharge Condition: Stable Ambulatory Status: Wheelchair Discharge Destination: Home Transportation: Private Auto Accompanied By: wife Schedule Follow-up Appointment: Yes Clinical Summary of Care: Patient Declined Electronic Signature(s) Signed: 11/30/2022 4:39:46 PM By: Sabas Sous By: Adline Peals on 11/30/2022 14:17:18 -------------------------------------------------------------------------------- Lower Extremity Assessment Details Patient Name: Date of Service: [place], [place] 11/30/2022 1:45 PM Medical Record Number: [id number] Patient Account Number: [id number] Date of Birth/Sex: Treating RN: 1966/05/26 (57 y.o. [name] Primary Care Toron Bowring: PCP, NO Other Clinician: Referring Abigail Marsiglia: Treating Coren Sagan/Extender: Lannie Fields in Treatment: 46 Electronic Signature(s) Signed: 11/30/2022 4:39:46 PM By: Sabas Sous By: Adline Peals on 11/30/2022 13:53:18 -------------------------------------------------------------------------------- Multi Wound Chart Details Patient Name: Date of Service: [name], [place] 11/30/2022 1:45 PM Medical Record Number: [id number] Patient Account Number: [id number] Date of Birth/Sex: Treating RN: 06-13-1966 (57 y.o. M) Primary Care Dawn Convery: PCP, NO Other Clinician: Referring Arley Garant: Treating Kenzey Birkland/Extender: Lannie Fields in Treatment: 46 Vital Signs Height(in): 75 Capillary Blood Glucose(mg/dl): 131 Weight(lbs): 220 Pulse(bpm): 76 Body Mass Index(BMI): 27.5 Blood Pressure(mmHg): 187/72 Temperature(F): 97.8 Respiratory Rate(breaths/min): 18 [1:Photos:]  [N/A:N/A] Mozer, [place] ([id number]) [1:Sacrum Wound Location: Pressure Injury Wounding Event: Pressure Ulcer Primary Etiology: Congestive Heart Failure, Comorbid History: Hypertension, Type II Diabetes, Osteomyelitis, Confinement Anxiety 07/29/2021 Date Acquired: 75 Weeks of Treatment:  Open Wound Status: No Wound Recurrence: 0x0x0 Measurements L x W x D (cm) 0 A (cm) : rea 0 Volume (cm) : 100.00% % Reduction in A rea: 100.00% % Reduction in Volume: Category/Stage IV Classification: None Present Exudate A mount: Epibole Wound Margin:  None Present (0%) Granulation A mount: None Present (0%) Necrotic A mount: Fascia: No Exposed Structures: Fat Layer (Subcutaneous Tissue): No Tendon:  No Muscle: No Joint: No Bone: No Large (67-100%) Epithelialization: Excoriation: No Periwound Skin  Texture: Callus: No Rash: No Maceration: No Periwound Skin Moisture: Dry/Scaly: No No Abnormalities Noted Periwound Skin Color: No Abnormality Temperature:] [N/A:N/A N/A N/A N/A N/A N/A N/A N/A N/A N/A N/A N/A N/A N/A N/A N/A N/A N/A N/A N/A N/A N/A N/A  N/A] Treatment Notes Electronic Signature(s) Signed: 11/30/2022 2:03:21 PM By: [name] MD FACS Entered By: [name] on 11/30/2022 14:03:21 -------------------------------------------------------------------------------- Multi-Disciplinary Care Plan Details Patient Name: Date of Service: [name], [place] 11/30/2022 1:45 PM Medical Record Number: [id number] Patient Account Number: [id number] Date of Birth/Sex: Treating RN: 1966-02-20 (57 y.o. [name] Primary Care Rod Majerus: PCP, NO Other Clinician: Referring Milo Solana: Treating Clifton Kovacic/Extender: Lannie Fields in Treatment: [place] reviewed with physician Active Inactive Electronic Signature(s) Signed: 11/30/2022 4:39:46 PM By: Sabas Sous By: Adline Peals on 11/30/2022  14:16:16 -------------------------------------------------------------------------------- Pain Assessment Details Patient Name: Date of Service: [name], [name] 11/30/2022 1:45 PM Medical Record Number: [id number] Patient Account Number: [id number] Date of Birth/Sex: Treating RN: 12-24-1965 (57 y.o. [name] Primary Care Ezechiel Stooksbury: PCP, NO Other Clinician: Referring Bernece Gall: Treating Ilanna Deihl/Extender: Lannie Fields in Treatment: [address] [place], [place]bie [place] ([id number]) [id number] Page 5 of 7 Location of Pain Severity and Description of Pain Patient Has Paino Yes Site Locations Pain Location: Generalized Pain, Pain in Ulcers Duration of the Pain. Constant / Intermittento Constant Rate the pain. Current Pain Level: 6 Pain Management and Medication Current Pain Management: Medication: Yes Electronic Signature(s) Signed: 11/30/2022 4:39:46 PM By: Adline Peals Entered By: Adline Peals on 11/30/2022 13:51:13 -------------------------------------------------------------------------------- Patient/Caregiver Education Details Patient Name: Date of Service: [name], [name] 3/25/2024andnbsp1:45 PM Medical Record Number: [id number] Patient Account Number: [id number] Date of Birth/Gender: Treating RN: [birth year] (57 y.o. [name] Primary Care Physician: PCP, NO Other Clinician: Referring Physician: Treating Physician/Extender: Lannie Fields in Treatment: 46 Education Assessment Education Provided To: Patient Education Topics Provided Pressure: Methods: Explain/Verbal Responses: Reinforcements needed, State content correctly Electronic Signature(s) Signed: 11/30/2022 4:39:46 PM By: Adline Peals Entered By: Adline Peals on 11/30/2022 14:16:32 -------------------------------------------------------------------------------- Wound Assessment  Details Patient Name: Date of Service: [name], [place] 11/30/2022 1:45 PM Medical Record Number: [id number] Patient Account Number: [id number] Date of Birth/Sex: Treating RN: Mar 27, 1966 (57 y.o. [name] Primary Care Makisha Marrin: PCP, NO Other Clinician: Referring Kolson Chovanec: Treating Jaryn Hocutt/Extender: Lannie Fields in Treatment: 46 Wound Status Wound Number: 1 Primary Pressure Ulcer Etiology: Wound Location: [place], [place] ([id number]) [id number] Page 6 of 7 Wound Open Wounding Event: Pressure Injury Status: Date Acquired: 07/29/2021 Comorbid Congestive Heart Failure, Hypertension, Type II Diabetes, Weeks Of Treatment: 46 History: Osteomyelitis, Confinement Anxiety Clustered Wound: No Photos Wound Measurements Length: (cm) Width: (cm) Depth: (cm) Area: (cm) Volume: (cm) 0 % Reduction in Area: 100% 0 % Reduction in Volume: 100% 0 Epithelialization: Large (67-100%) 0 Tunneling: No 0 Undermining: No Wound Description Classification: Category/Stage IV Wound Margin: Epibole Exudate Amount: None Present Foul Odor After Cleansing: No Slough/Fibrino No Wound Bed Granulation Amount: None Present (0%) Exposed Structure Necrotic Amount: None Present (0%) Fascia Exposed: No Fat Layer (Subcutaneous Tissue) Exposed: No Tendon Exposed: No Muscle Exposed: No Joint Exposed: No Bone Exposed: No Periwound Skin Texture Texture Color No Abnormalities Noted: Yes No Abnormalities Noted: Yes Moisture Temperature / Pain No Abnormalities Noted: Yes Temperature: No Abnormality Electronic Signature(s) Signed: 11/30/2022 4:39:46 PM By: Sabas Sous  By: Adline Peals on 11/30/2022 13:54:27 -------------------------------------------------------------------------------- Vitals Details Patient Name: Date of Service: [facility], [name] 11/30/2022 1:45 PM Medical Record Number: [id number] Patient  Account Number: [id number] Date of Birth/Sex: Treating RN: 18-May-1966 (57 y.o. [name] Primary Care Brienna Bass: PCP, NO Other Clinician: Referring Amariya Liskey: Treating Avaeh Ewer/Extender: Lannie Fields in Treatment: 46 Vital Signs Time Taken: 13:50 Temperature (F): 97.8 Height (in): 75 Pulse (bpm): 76 Weight (lbs): 220 Respiratory Rate (breaths/min): 18 Body Mass Index (BMI): 27.5 Blood Pressure (mmHg): 187/72 Capillary Blood Glucose (mg/dl): 131 Reference Range: 80 - 120 mg / dl Olesen, Taray ([id number]) [phone].pdf Page 7 of 7 Electronic Signature(s) Signed: 11/30/2022 4:39:46 PM By: Adline Peals Entered By: Adline Peals on 11/30/2022 13:51:17

## 2022-11-30 NOTE — Progress Notes (Signed)
[name] ([id number]) [id number] Page 1 of 8 Visit Report for 11/30/2022 Chief Complaint Document Details Patient Name: Date of Service: [name], [name] 11/30/2022 1:45 PM Medical Record Number: [id number] Patient Account Number: [id number] Date of Birth/Sex: Treating RN: 1966-04-20 (57 y.o. M) Primary Care Provider: PCP, NO Other Clinician: Referring Provider: Treating Provider/Extender: [name] in Treatment: 46 Information Obtained from: Patient Chief Complaint Patient is at the clinic for treatment of an open pressure ulcer Electronic Signature(s) Signed: 11/30/2022 2:03:28 PM By: [name] Maudlin MD FACS Entered By: [name] on 11/30/2022 14:03:28 -------------------------------------------------------------------------------- HPI Details Patient Name: Date of Service: [name], [name] 11/30/2022 1:45 PM Medical Record Number: [id number] Patient Account Number: [id number] Date of Birth/Sex: Treating RN: [birth year] (57 y.o. M) Primary Care Provider: PCP, NO Other Clinician: Referring Provider: Treating Provider/Extender: [name] in Treatment: 46 History of Present Illness HPI Description: ADMISSION 01/08/2022 This is a 57 year old male with a past medical history notable for type 2 diabetes mellitus (last A1c was 8.6) congestive heart failure, hypertension, chronic pain, and bilateral below-knee amputations. His most recent amputation was in November 2022. While he was in the hospital, he developed a sacral pressure ulcer. He was subsequently in a skilled nursing facility for some time. He was discharged with home health and had been in a wound VAC, but was then admitted to the hospital last week when the wound appeared to be worsening. Apparently the periwound skin was macerated and the device that he had been using was leaking quite a bit. Evaluation while in the hospital  included a consultation with infectious disease who did not think he had any evidence of osteomyelitis, plastic surgery who felt that he was not an appropriate flap candidate (their note also states that he is not interested in a flap) and wound care who took him out of the wound VAC and initiated wet-to-dry dressing changes. He has a new wound VAC from [facility] on order, anticipated delivery today. The wound itself is fairly small and isolated to the sacrum. There is muscle exposed. No bone is appreciated but it is palpable beneath the surface. The muscle itself is bit pale and there is heaped up epibole around the wound edges. No significant odor or drainage. 01/14/2022: His wound VAC was not initiated until this past Friday. He has not had any issues with the VAC but today we found that the bridge foam was applied directly to the skin rather than over a layer of adhesive drape. His home health nurse also requested that we consider applying silver collagen to the wound bed in addition to the VAC. There is still a little bit of heaped up senescent skin around the wound periphery. No significant change to the wound dimensions. 01/21/2022: No significant change to the wound dimensions. The senescent skin has not reaccumulated. The periwound skin remains a bit macerated but without any obvious breakdown. The wound surface itself has a shiny appearance with a little bit of slough accumulation; no true robust granulation tissue at this time. 01/28/2022: The wound is about the same size but a little bit shallower. There is a little bit of senescent skin reaccumulation at the cranial aspect. The periwound skin is red but not macerated and without any tissue breakdown. The wound still does not have the most robust surface. There is a bit of slough accumulation. 02/04/2022: The wound is a bit smaller and the undermining has come in somewhat. There continues to be granulation tissue  formation within the wound bed.  No significant slough accumulation and his periwound skin is in better condition. 02/18/2022: The wound stinks today. There is no obvious pus but the drainage and wound itself are malodorous. He continues to have heaped up tissue within the wound bed that is rather grayish and not particularly robust-appearing. He is very angry today with his situation. 02/25/2022: Last week, in response to the odor coming from the wound, I took a culture and prescribed topical gentamicin as well as oral cefdinir. Apparently [facility] contacted him about a topical compounded antibiotic, but he did not realize this and he hung up on them. Today, the odor is no longer present. There is some senescent skin around the wound margins as well as continued heaped up granulation tissue in the center of the wound. The undermining continues to contract. 03/04/2022: The wound continues to contract and look better. He still has heaped up hypertrophic granulation tissue near the orifice. No odor was coming from the wound today. He is awaiting [place] delivery. 03/18/2022: 2-week follow-up. [facility] topical antibiotic has been initiated. The chemical cauterization of the hypertrophic granulation tissue was quite successful and the surface is much flatter today. He has heaped up senescent skin around the perimeter. His home health providers have figured out a way to keep the wound VAC from losing suction by bolstering with DuoDERM. Overall there has been significant improvement since our last visit. [name] ([id number]) [id number] Page 2 of 8 04/01/2022: 2-week follow-up. The wound continues to contract. Once again, there is heaped up senescent skin around the perimeter. He has a little bit of skin breakdown in the distribution of the adhesive drape from the wound VAC. No odor or purulent drainage. No concern for infection. 04/15/2022: 2-week follow-up. He has developed a fairly substantial rash from the  drape adhesive for his wound VAC. The periwound has a lot of heaped up epiboly at the margins. The tissue in the wound bed is a little bit pale but there is no odor or purulent drainage. 04/22/2022: Last week we discontinued the VAC. Today, the skin around his wound is in significantly improved condition. His rash is resolving. He does have some heaped up senescent skin around the wound margin and a layer of slough on the wound surface, but there is no concern for active infection. 04/29/2022: The wound continues to contract. The surface is clean. He continues to build up senescent skin around the wound edges that subsequently get a little bit macerated. No concern for infection. 05/06/2022: The wound is smaller again today in all dimensions. The surface is clean. He has his usual accumulation of macerated senescent skin around the wound edges. 05/13/2022: Continued wound contracture. The undermining has decreased quite a bit. Clean surface. Senescent skin heaped up around the wound edges, as per usual. 05/22/2022: The macerated senescent skin has accumulated once again. The wound dimensions are slightly smaller and the surface is clean. He has been having difficulty keeping the packing in the wound due to the large foam border dressing that he prefers. 06/03/2022: The wound is smaller and shallower with less undermining. He has built up macerated senescent skin around the margins, as per usual. He and his friend have figured out a way to make sure that his packing stays in the wound with his [place]. 06/12/2022: The wound is a little bit shallower again today. He still has accumulated senescent periwound skin, but otherwise the wound is clean. He will be undergoing bilateral recurrent inguinal hernia  repair and umbilical hernia repair next week. 10/20 sacral pressure ulcer. using [facility] and backing wet to dry. Has developed a rash sur rounding the wound 07/03/2022: Continued contracture of the wound. It  is very clean and there is no odor or purulent drainage. Continued buildup of senescent skin around the margin. 07/13/2022: There is less undermining present today. The orifice continues to contract. He continues to accumulate senescent skin around the perimeter. The rash around the wound has improved. 07/27/2022: The wound is smaller again today and the undermining is closed then. There is senescent skin accumulation, as usual. 08/07/2022: The wound continues to contract but has its usual accumulation of periwound senescent skin. Unfortunately, the periwound skin looks like it has gotten more moist. It is red and angry and the patient says it is more painful. 08/14/2022: The wound is smaller again this week. His periwound skin is in much better condition. There is still senescent skin around the wound margins and a bit of slough on the surface. 12/18; this is a lower sacral pressure ulcer. We have been using silver alginate for the last 2 weeks. This is longstanding and refractory. At 1 point I think had exposed bone he no longer has this now. He tells me that he has had problems with his right lower leg stump and he is going to see hangers she is therefore not walking but using his wheelchair although he says he is in his wheelchair "only 10% of the time]. Otherwise he claims to be rigorous with offloading this. 09/08/2022: The wound is considerably smaller than the last time I saw it. He has, as usual, the senescent skin accumulation around the wound margin. The undermining and depth, as well as the overall dimensions have come in significantly. 09/21/2022: The wound dimensions are roughly the same, no significant accumulation of periwound senescent skin. There is some reaccumulation of hypertrophic granulation tissue on the wound bed, however. He is spending a bit more time in his wheelchair due to issues with his prosthetics, but he is hopeful that this will be resolved within the next week or  so. 10/05/2022: The undermined portion of his wound has come in quite a bit. They are having difficulty getting the silver alginate to stay in place due to the decrease in wound size. He has accumulated senescent skin around the wound margins. 10/19/2022: The wound is down to just a couple of millimeters. 11/02/2022: No significant change in the wound dimensions, but there is no undermining present. No senescent skin accumulation. 11/06/2022: The wound is about a millimeter in diameter with a couple millimeters depth. It is clean. There is some callused skin around the opening. 11/30/2022: His wound is healed. Electronic Signature(s) Signed: 11/30/2022 2:03:42 PM By: [name] Maudlin MD FACS Entered By: [name] on 11/30/2022 14:03:42 -------------------------------------------------------------------------------- Physical Exam Details Patient Name: Date of Service: [name], [name] 11/30/2022 1:45 PM Medical Record Number: [id number] Patient Account Number: [id number] Date of Birth/Sex: Treating RN: [birth year] (57 y.o. M) Primary Care Provider: PCP, NO Other Clinician: Referring Provider: Treating Provider/Extender: [name] in Treatment: 46 Constitutional Hypertensive, asymptomatic. . . . no acute distress. [name], [name] ([id number]) [id number] Page 3 of 8 Respiratory Normal work of breathing on room air. Notes 11/30/2022: His wound is healed. Electronic Signature(s) Signed: 11/30/2022 2:21:33 PM By: [name] Maudlin MD FACS Entered By: [name] on 11/30/2022 14:21:33 -------------------------------------------------------------------------------- Physician Orders Details Patient Name: Date of Service: [name], [name] 11/30/2022 1:45 PM Medical Record Number:  [id number] Patient Account Number: [id number] Date of Birth/Sex: Treating RN: 03-Sep-1966 (57 y.o. [name] Primary Care Provider: PCP, NO Other  Clinician: Referring Provider: Treating Provider/Extender: [name] in Treatment: [phone] Verbal / Phone Orders: No Diagnosis Coding ICD-10 Coding Code Description L89.154 Pressure ulcer of sacral region, stage 4 Z89.512 Acquired absence of left leg below knee Z89.511 Acquired absence of right leg below knee Discharge From [facility] Services Discharge from [facility]!!!!!!! Anesthetic (In clinic) Topical Lidocaine 4% applied to wound bed Off-Loading Gel mattress overlay (Group 1) Turn and reposition every 2 hours Patient Medications llergies: Bactrim, Ceprotin ([place]), ciprofloxacin, Levaquin A Notifications Medication Indication Start End 11/30/2022 lidocaine DOSE topical 4 % cream - cream topical Electronic Signature(s) Signed: 11/30/2022 2:22:27 PM By: [name] Maudlin MD FACS Entered By: [name] on 11/30/2022 14:22:27 -------------------------------------------------------------------------------- Problem List Details Patient Name: Date of Service: [name], [name] Dazey 11/30/2022 1:45 PM Medical Record Number: [id number] Patient Account Number: [id number] Date of Birth/Sex: Treating RN: 27-Sep-1965 (57 y.o. M) Primary Care Provider: PCP, NO Other Clinician: Referring Provider: Treating Provider/Extender: [name] in Treatment: 46 Active Problems ICD-10 Encounter Code Description Active Date MDM Diagnosis L89.154 Pressure ulcer of sacral region, stage 4 01/08/2022 No Yes [name], [name] ([id number]) [phone] Page 4 of 8 [phone] Acquired absence of left leg below knee 01/08/2022 No Yes Z89.511 Acquired absence of right leg below knee 01/08/2022 No Yes Inactive Problems Resolved Problems Electronic Signature(s) Signed: 11/30/2022 2:03:00 PM By: [name] Maudlin MD FACS Entered By: [name] on 11/30/2022  14:02:59 -------------------------------------------------------------------------------- Progress Note Details Patient Name: Date of Service: [name], [place] 11/30/2022 1:45 PM Medical Record Number: [id number] Patient Account Number: [id number] Date of Birth/Sex: Treating RN: 02-Aug-1966 (57 y.o. M) Primary Care Provider: PCP, NO Other Clinician: Referring Provider: Treating Provider/Extender: [name] in Treatment: 46 Subjective Chief Complaint Information obtained from Patient Patient is at the clinic for treatment of an open pressure ulcer History of Present Illness (HPI) ADMISSION 01/08/2022 This is a 57 year old male with a past medical history notable for type 2 diabetes mellitus (last A1c was 8.6) congestive heart failure, hypertension, chronic pain, and bilateral below-knee amputations. His most recent amputation was in November 2022. While he was in the hospital, he developed a sacral pressure ulcer. He was subsequently in a skilled nursing facility for some time. He was discharged with home health and had been in a wound VAC, but was then admitted to the hospital last week when the wound appeared to be worsening. Apparently the periwound skin was macerated and the device that he had been using was leaking quite a bit. Evaluation while in the hospital included a consultation with infectious disease who did not think he had any evidence of osteomyelitis, plastic surgery who felt that he was not an appropriate flap candidate (their note also states that he is not interested in a flap) and wound care who took him out of the wound VAC and initiated wet-to-dry dressing changes. He has a new wound VAC from [facility] on order, anticipated delivery today. The wound itself is fairly small and isolated to the sacrum. There is muscle exposed. No bone is appreciated but it is palpable beneath the surface. The muscle itself is bit pale and there is heaped up epibole  around the wound edges. No significant odor or drainage. 01/14/2022: His wound VAC was not initiated until this past Friday. He has not had any issues with  the [facility] but today we found that the bridge foam was applied directly to the skin rather than over a layer of adhesive drape. His home health nurse also requested that we consider applying silver collagen to the wound bed in addition to the VAC. There is still a little bit of heaped up senescent skin around the wound periphery. No significant change to the wound dimensions. 01/21/2022: No significant change to the wound dimensions. The senescent skin has not reaccumulated. The periwound skin remains a bit macerated but without any obvious breakdown. The wound surface itself has a shiny appearance with a little bit of slough accumulation; no true robust granulation tissue at this time. 01/28/2022: The wound is about the same size but a little bit shallower. There is a little bit of senescent skin reaccumulation at the cranial aspect. The periwound skin is red but not macerated and without any tissue breakdown. The wound still does not have the most robust surface. There is a bit of slough accumulation. 02/04/2022: The wound is a bit smaller and the undermining has come in somewhat. There continues to be granulation tissue formation within the wound bed. No significant slough accumulation and his periwound skin is in better condition. 02/18/2022: The wound stinks today. There is no obvious pus but the drainage and wound itself are malodorous. He continues to have heaped up tissue within the wound bed that is rather grayish and not particularly robust-appearing. He is very angry today with his situation. 02/25/2022: Last week, in response to the odor coming from the wound, I took a culture and prescribed topical gentamicin as well as oral cefdinir. Apparently [facility] contacted him about a topical compounded antibiotic, but he did not realize this and he hung  up on them. Today, the odor is no longer present. There is some senescent skin around the wound margins as well as continued heaped up granulation tissue in the center of the wound. The undermining continues to contract. 03/04/2022: The wound continues to contract and look better. He still has heaped up hypertrophic granulation tissue near the orifice. No odor was coming from the wound today. He is awaiting [place] delivery. 03/18/2022: 2-week follow-up. [facility] topical antibiotic has been initiated. The chemical cauterization of the hypertrophic granulation tissue was quite successful and the surface is much flatter today. He has heaped up senescent skin around the perimeter. His home health providers have figured out a way to keep the wound VAC from losing suction by bolstering with DuoDERM. Overall there has been significant improvement since our last visit. 04/01/2022: 2-week follow-up. The wound continues to contract. Once again, there is heaped up senescent skin around the perimeter. He has a little bit of skin [name], [name] ([id number]) [id number] Page 5 of 8 breakdown in the distribution of the adhesive drape from the wound VAC. No odor or purulent drainage. No concern for infection. 04/15/2022: 2-week follow-up. He has developed a fairly substantial rash from the drape adhesive for his wound VAC. The periwound has a lot of heaped up epiboly at the margins. The tissue in the wound bed is a little bit pale but there is no odor or purulent drainage. 04/22/2022: Last week we discontinued the VAC. Today, the skin around his wound is in significantly improved condition. His rash is resolving. He does have some heaped up senescent skin around the wound margin and a layer of slough on the wound surface, but there is no concern for active infection. 04/29/2022: The wound continues to contract. The surface is  clean. He continues to build up senescent skin around the wound edges  that subsequently get a little bit macerated. No concern for infection. 05/06/2022: The wound is smaller again today in all dimensions. The surface is clean. He has his usual accumulation of macerated senescent skin around the wound edges. 05/13/2022: Continued wound contracture. The undermining has decreased quite a bit. Clean surface. Senescent skin heaped up around the wound edges, as per usual. 05/22/2022: The macerated senescent skin has accumulated once again. The wound dimensions are slightly smaller and the surface is clean. He has been having difficulty keeping the packing in the wound due to the large foam border dressing that he prefers. 06/03/2022: The wound is smaller and shallower with less undermining. He has built up macerated senescent skin around the margins, as per usual. He and his friend have figured out a way to make sure that his packing stays in the wound with his [place]. 06/12/2022: The wound is a little bit shallower again today. He still has accumulated senescent periwound skin, but otherwise the wound is clean. He will be undergoing bilateral recurrent inguinal hernia repair and umbilical hernia repair next week. 10/20 sacral pressure ulcer. using [facility] and backing wet to dry. Has developed a rash sur rounding the wound 07/03/2022: Continued contracture of the wound. It is very clean and there is no odor or purulent drainage. Continued buildup of senescent skin around the margin. 07/13/2022: There is less undermining present today. The orifice continues to contract. He continues to accumulate senescent skin around the perimeter. The rash around the wound has improved. 07/27/2022: The wound is smaller again today and the undermining is closed then. There is senescent skin accumulation, as usual. 08/07/2022: The wound continues to contract but has its usual accumulation of periwound senescent skin. Unfortunately, the periwound skin looks like it has gotten more moist. It is  red and angry and the patient says it is more painful. 08/14/2022: The wound is smaller again this week. His periwound skin is in much better condition. There is still senescent skin around the wound margins and a bit of slough on the surface. 12/18; this is a lower sacral pressure ulcer. We have been using silver alginate for the last 2 weeks. This is longstanding and refractory. At 1 point I think had exposed bone he no longer has this now. He tells me that he has had problems with his right lower leg stump and he is going to see hangers she is therefore not walking but using his wheelchair although he says he is in his wheelchair "only 10% of the time]. Otherwise he claims to be rigorous with offloading this. 09/08/2022: The wound is considerably smaller than the last time I saw it. He has, as usual, the senescent skin accumulation around the wound margin. The undermining and depth, as well as the overall dimensions have come in significantly. 09/21/2022: The wound dimensions are roughly the same, no significant accumulation of periwound senescent skin. There is some reaccumulation of hypertrophic granulation tissue on the wound bed, however. He is spending a bit more time in his wheelchair due to issues with his prosthetics, but he is hopeful that this will be resolved within the next week or so. 10/05/2022: The undermined portion of his wound has come in quite a bit. They are having difficulty getting the silver alginate to stay in place due to the decrease in wound size. He has accumulated senescent skin around the wound margins. 10/19/2022: The wound is down to  just a couple of millimeters. 11/02/2022: No significant change in the wound dimensions, but there is no undermining present. No senescent skin accumulation. 11/06/2022: The wound is about a millimeter in diameter with a couple millimeters depth. It is clean. There is some callused skin around the opening. 11/30/2022: His wound is  healed. Patient History Information obtained from Patient, Caregiver, Chart. Family History Cancer - Father, Heart Disease - Father,Paternal Grandparents, Hypertension - Father, No family history of Diabetes, Hereditary Spherocytosis, Kidney Disease, Lung Disease, Seizures, Stroke, Thyroid Problems, Tuberculosis. Social History Never smoker, Marital Status - Divorced, Alcohol Use - Never, Drug Use - No History, Caffeine Use - Moderate - coffee. Medical History Eyes Denies history of Cataracts, Glaucoma, Optic Neuritis Ear/Nose/Mouth/Throat Denies history of Chronic sinus problems/congestion, Middle ear problems Cardiovascular Patient has history of Congestive Heart Failure, Hypertension Endocrine Patient has history of Type II Diabetes Denies history of Type I Diabetes Genitourinary Denies history of End Stage Renal Disease Integumentary (Skin) Denies history of History of Burn Musculoskeletal [name], [name] ([id number]) [id number] Page 6 of 8 Patient has history of Osteomyelitis - S1 and coccyx Oncologic Denies history of Received Chemotherapy, Received Radiation Psychiatric Patient has history of Confinement Anxiety Denies history of Anorexia/bulimia Hospitalization/Surgery History - bil BKA. Medical A Surgical History Notes nd Respiratory pulmonary nodules Genitourinary renal mass, urinary hesitancy Musculoskeletal discitis of thoracic region Objective Constitutional Hypertensive, asymptomatic. no acute distress. Vitals Time Taken: 1:50 PM, Height: 75 in, Weight: 220 lbs, BMI: 27.5, Temperature: 97.8 F, Pulse: 76 bpm, Respiratory Rate: 18 breaths/min, Blood Pressure: 187/72 mmHg, Capillary Blood Glucose: 131 mg/dl. Respiratory Normal work of breathing on room air. General Notes: 11/30/2022: His wound is healed. Integumentary (Hair, Skin) Wound #1 status is Open. Original cause of wound was Pressure Injury. The date acquired was:  07/29/2021. The wound has been in treatment 46 weeks. The wound is located on the Sacrum. The wound measures 0cm length x 0cm width x 0cm depth; 0cm^2 area and 0cm^3 volume. There is no tunneling or undermining noted. There is a none present amount of drainage noted. The wound margin is epibole. There is no granulation within the wound bed. There is no necrotic tissue within the wound bed. The periwound skin appearance had no abnormalities noted for texture. The periwound skin appearance had no abnormalities noted for moisture. The periwound skin appearance had no abnormalities noted for color. Periwound temperature was noted as No Abnormality. 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 Anesthetic: (In clinic) Topical Lidocaine 4% applied to wound bed Off-Loading: Gel mattress overlay (Group 1) Turn and reposition every 2 hours The following medication(s) was prescribed: lidocaine topical 4 % cream cream topical was prescribed at facility 11/30/2022: His wound has healed. He was reminded of the importance of avoiding spending excessive amounts of time in his wheelchair or in bed lying on the recently healed area. He says he is getting new bilateral prostheses and does not plan to spend much time sitting at all, once he is able to walk again. We will discharge him from the wound care center and he may follow-up as needed. Electronic Signature(s) [name], [name] ([id number]) [id number] Page 7 of 8 Signed: 11/30/2022 2:23:20 PM By: [name] Maudlin MD FACS Entered By: [name] on 11/30/2022 14:23:20 -------------------------------------------------------------------------------- HxROS Details Patient Name: Date of Service: [name], [name] 11/30/2022  1:45  PM Medical Record Number: [id number] Patient Account Number: [id number] Date of Birth/Sex: Treating RN: 05/13/1966 (57 y.o. M) Primary Care Provider: PCP, NO Other Clinician: Referring Provider: Treating Provider/Extender: [name] in Treatment: 46 Information Obtained From Patient Caregiver Chart Eyes Medical History: Negative for: Cataracts; Glaucoma; Optic Neuritis Ear/Nose/Mouth/Throat Medical History: Negative for: Chronic sinus problems/congestion; Middle ear problems Respiratory Medical History: Past Medical History Notes: pulmonary nodules Cardiovascular Medical History: Positive for: Congestive Heart Failure; Hypertension Endocrine Medical History: Positive for: Type II Diabetes Negative for: Type I Diabetes Time with diabetes: since 2004 Treated with: Insulin Blood sugar tested every day: Yes Tested : 4 times per day Genitourinary Medical History: Negative for: End Stage Renal Disease Past Medical History Notes: renal mass, urinary hesitancy Integumentary (Skin) Medical History: Negative for: History of Burn Musculoskeletal Medical History: Positive for: Osteomyelitis - S1 and coccyx Past Medical History Notes: discitis of thoracic region Oncologic Medical History: Negative for: Received Chemotherapy; Received Radiation Psychiatric Medical History: Positive for: Confinement Anxiety Negative for: [name], [name] ([id number]) [id number] Page 8 of 8 Immunizations Pneumococcal Vaccine: Received Pneumococcal Vaccination: No Implantable Devices No devices added Hospitalization / Surgery History Type of Hospitalization/Surgery bil BKA Family and Social History Cancer: Yes - Father; Diabetes: No; Heart Disease: Yes - Father,Paternal Grandparents; Hereditary Spherocytosis: No; Hypertension: Yes - Father; Kidney Disease: No; Lung Disease: No; Seizures: No; Stroke: No; Thyroid  Problems: No; Tuberculosis: No; Never smoker; Marital Status - Divorced; Alcohol Use: Never; Drug Use: No History; Caffeine Use: Moderate - coffee; Financial Concerns: No; Food, Clothing or Shelter Needs: No; Support System Lacking: No; Transportation Concerns: Yes - hurts to transfer Electronic Signature(s) Signed: 11/30/2022 2:38:29 PM By: [name] Maudlin MD FACS Entered By: [name] on 11/30/2022 14:03:48 -------------------------------------------------------------------------------- SuperBill Details Patient Name: Date of Service: [name], [place] 11/30/2022 Medical Record Number: [id number] Patient Account Number: [id number] Date of Birth/Sex: Treating RN: 07-02-1966 (58 y.o. [name] Primary Care Provider: PCP, NO Other Clinician: Referring Provider: Treating Provider/Extender: [name] in Treatment: 46 Diagnosis Coding ICD-10 Codes Code Description L89.154 Pressure ulcer of sacral region, stage 4 Z89.512 Acquired absence of left leg below knee Z89.511 Acquired absence of right leg below knee Facility Procedures : CPT4 Code: [id number] Description: [id number] - WOUND CARE VISIT-LEV 2 EST PT Modifier: Quantity: 1 Physician Procedures : CPT4 Code Description Modifier [id number] - WC PHYS LEVEL 3 - EST PT ICD-10 Diagnosis Description L89.154 Pressure ulcer of sacral region, stage 4 Z89.512 Acquired absence of left leg below knee Z89.511 Acquired absence of right leg below knee Quantity: 1 Electronic Signature(s) Signed: 11/30/2022 2:23:32 PM By: [name] Maudlin MD FACS Entered By: [name] on 11/30/2022 14:23:32

## 2022-12-09 ENCOUNTER — Telehealth: Payer: Self-pay

## 2022-12-09 NOTE — Patient Outreach (Signed)
  Care Coordination   Initial Visit Note   12/09/2022 Name: [name] MRN: [id number] DOB: March 22, 1966  [name] is a 57 y.o. year old male who sees Loralee Pacas, MD for primary care. I spoke with  [name] by phone today.  What matters to the patients health and wellness today?  Placed call to patient to review and offer [facility] care coordination program.  Patient refused.      SDOH assessments and interventions completed:  No     Care Coordination Interventions:  No, not indicated   Follow up plan: No further intervention required.   Encounter Outcome:  Pt. Refused   Tomasa Rand, RN, BSN, CEN [facility] [employer] [phone]

## 2022-12-15 ENCOUNTER — Other Ambulatory Visit: Payer: Self-pay | Admitting: Internal Medicine

## 2022-12-15 DIAGNOSIS — M549 Dorsalgia, unspecified: Secondary | ICD-10-CM

## 2022-12-18 NOTE — Telephone Encounter (Signed)
Patient called for an update on medication. Pt would also like to know why he has to request medication refill so often since not controlled substance.

## 2022-12-23 ENCOUNTER — Ambulatory Visit (INDEPENDENT_AMBULATORY_CARE_PROVIDER_SITE_OTHER): Payer: Medicare HMO | Admitting: Internal Medicine

## 2022-12-23 ENCOUNTER — Encounter: Payer: Self-pay | Admitting: Internal Medicine

## 2022-12-23 VITALS — BP 140/90 | HR 102 | Temp 97.9°F | Ht 75.0 in

## 2022-12-23 DIAGNOSIS — I5032 Chronic diastolic (congestive) heart failure: Secondary | ICD-10-CM | POA: Diagnosis not present

## 2022-12-23 DIAGNOSIS — M62838 Other muscle spasm: Secondary | ICD-10-CM | POA: Diagnosis not present

## 2022-12-23 DIAGNOSIS — I5022 Chronic systolic (congestive) heart failure: Secondary | ICD-10-CM | POA: Diagnosis not present

## 2022-12-23 DIAGNOSIS — I10 Essential (primary) hypertension: Secondary | ICD-10-CM | POA: Diagnosis not present

## 2022-12-23 DIAGNOSIS — G8929 Other chronic pain: Secondary | ICD-10-CM | POA: Diagnosis not present

## 2022-12-23 DIAGNOSIS — M549 Dorsalgia, unspecified: Secondary | ICD-10-CM | POA: Diagnosis not present

## 2022-12-23 DIAGNOSIS — E108 Type 1 diabetes mellitus with unspecified complications: Secondary | ICD-10-CM | POA: Diagnosis not present

## 2022-12-23 DIAGNOSIS — E1042 Type 1 diabetes mellitus with diabetic polyneuropathy: Secondary | ICD-10-CM

## 2022-12-23 DIAGNOSIS — E291 Testicular hypofunction: Secondary | ICD-10-CM | POA: Diagnosis not present

## 2022-12-23 LAB — CBC WITH DIFFERENTIAL/PLATELET
Basophils Absolute: 0 10*3/uL (ref 0.0–0.1)
Basophils Relative: 0.3 % (ref 0.0–3.0)
Eosinophils Absolute: 0.1 10*3/uL (ref 0.0–0.7)
Eosinophils Relative: 1.8 % (ref 0.0–5.0)
HCT: 56.6 % — ABNORMAL HIGH (ref 39.0–52.0)
Hemoglobin: 19.4 g/dL (ref 13.0–17.0)
Lymphocytes Relative: 11.7 % — ABNORMAL LOW (ref 12.0–46.0)
Lymphs Abs: 0.7 10*3/uL (ref 0.7–4.0)
MCHC: 34.2 g/dL (ref 30.0–36.0)
MCV: 90.3 fl (ref 78.0–100.0)
Monocytes Absolute: 0.6 10*3/uL (ref 0.1–1.0)
Monocytes Relative: 9.3 % (ref 3.0–12.0)
Neutro Abs: 4.7 10*3/uL (ref 1.4–7.7)
Neutrophils Relative %: 76.9 % (ref 43.0–77.0)
Platelets: 193 10*3/uL (ref 150.0–400.0)
RBC: 6.27 Mil/uL — ABNORMAL HIGH (ref 4.22–5.81)
RDW: 13.7 % (ref 11.5–15.5)
WBC: 6.2 10*3/uL (ref 4.0–10.5)

## 2022-12-23 LAB — COMPREHENSIVE METABOLIC PANEL
ALT: 55 U/L — ABNORMAL HIGH (ref 0–53)
AST: 30 U/L (ref 0–37)
Albumin: 4.2 g/dL (ref 3.5–5.2)
Alkaline Phosphatase: 100 U/L (ref 39–117)
BUN: 25 mg/dL — ABNORMAL HIGH (ref 6–23)
CO2: 24 mEq/L (ref 19–32)
Calcium: 9.6 mg/dL (ref 8.4–10.5)
Chloride: 103 mEq/L (ref 96–112)
Creatinine, Ser: 1.19 mg/dL (ref 0.40–1.50)
GFR: 68.09 mL/min (ref >60.00)
Glucose, Bld: 228 mg/dL — ABNORMAL HIGH (ref 70–99)
Potassium: 4.2 mEq/L (ref 3.5–5.1)
Sodium: 139 mEq/L (ref 135–145)
Total Bilirubin: 0.9 mg/dL (ref 0.2–1.2)
Total Protein: 6.8 g/dL (ref 6.0–8.3)

## 2022-12-23 LAB — HEMOGLOBIN A1C: Hgb A1c MFr Bld: 7.1 % — ABNORMAL HIGH (ref 4.6–6.5)

## 2022-12-23 LAB — TESTOSTERONE: Testosterone: 853.25 ng/dL (ref 300.00–890.00)

## 2022-12-23 LAB — LIPID PANEL
Cholesterol: 124 mg/dL (ref 0–200)
HDL: 45.2 mg/dL (ref >39.00)
LDL Cholesterol: 51 mg/dL (ref 0–99)
NonHDL: 79.26
Total CHOL/HDL Ratio: 3
Triglycerides: 141 mg/dL (ref 0.0–149.0)
VLDL: 28.2 mg/dL (ref 0.0–40.0)

## 2022-12-23 MED ORDER — BACLOFEN 10 MG PO TABS: 10.0000 mg | ORAL_TABLET | Freq: Three times a day (TID) | ORAL | 0 refills | Status: DC

## 2022-12-23 NOTE — Progress Notes (Unsigned)
[facility]: [phone]   Routine Medical Office Visit  Patient:  [name]      Age: 57 y.o.       Sex:  male  Date:   12/23/2022 PCP:    Lula Olszewski, MD   Today's Healthcare Provider: Lula Olszewski, MD   Assessment and Plan:   Type 1 diabetes mellitus with diabetic polyneuropathy, with long-term current use of insulin Assessment & Plan: >>ASSESSMENT AND PLAN FOR TYPE 1 DIABETES MELLITUS WITH DIABETIC POLYNEUROPATHY, WITH LONG-TERM CURRENT USE OF INSULIN WRITTEN ON 12/24/2022  6:46 PM BY Lula Olszewski, MD  We briefly discussed the potential benefits of following with endocrinology and doing more intensive f/u but he expressed a preference to not move forward with my offer(s) to facilitate the proposed intervention(s) at this time. We will revisit this option if needed at future visits and  he is always encouraged to contact me if the situation changes or he changes his mind.   Patient reports its controlled at home and labwork supports that    >>ASSESSMENT AND PLAN FOR TYPE 1 DIABETES MELLITUS WITH COMPLICATIONS WRITTEN ON 12/23/2022  2:06 PM BY Lula Olszewski, MD  Doesn't have endocriologist and states "that's too many doctors right now" Can't get to eye exam due to difficulty walking associated with amputations Reports his sugars are good   Hypogonadism in male Assessment & Plan: Upon reviewing the patient's health status and current medications, it's evident that he has a complex medical history with multiple comorbidities that could influence the management of his testosterone replacement therapy. The patient's self-reported symptoms of mental depression and tiredness could be multifactorial, given his history of depressive disorder, chronic pain, and other significant health issues.  He would prefer to double current injection(s) to 200 mg weekly from every 14 days (semiweekly)   For testosterone replacement therapy, I explained  the  goal is to achieve and maintain serum testosterone levels within the normal physiological range. The patient's current regimen of 200 mg every 14 days may need adjustment based on his symptoms and serum testosterone levels, but any decision to increase the dosage should be made cautiously, considering the potential for adverse effects, especially in the context of his cardiovascular and metabolic conditions.  The 6 months ago testosterone was middle of normal range when tested.    I offered to continue current dosing interval for now, and we will recheck levels and engage in further shared decision making after his request, after we have fresh testosterone levels.  I will want closer follow up if I agree to prescribe this higher dose.   My biggest concern(s) about prescribing the higher dose is that: Testosterone supplementation may potentially increase the risk of heart attack, particularly in men with pre-existing cardiovascular conditions, including those with a history of heart failure. Therefore, it is generally contraindicated or recommended with caution in patients with a history of heart failure, unless there is a compelling indication for its use, such as clinically significant hypogonadism with both symptoms and biochemical confirmation. In such cases, patients should be closely monitored for cardiovascular and heart failure symptoms throughout the therapy. The decision to initiate testosterone supplementation in these patients should be made on an individual basis, considering the potential risks and benefits and engaging in a shared decision-making process with the patient.       Orders: -     Testosterone -     Testosterone Cypionate; Inject 1 mL (200 mg total) into  the muscle every 14 (fourteen) days. Significant Risks of heart attack, stroke with continued use. Reducing dose as tolerated highly recommended  Dispense: 10 mL; Refill: 0  Chronic diastolic (congestive) heart failure  Muscle  spasm -     Baclofen; Take 1 tablet (10 mg total) by mouth 3 (three) times daily.  Dispense: 30 each; Refill: 0  Type 1 diabetes mellitus with complications -     CBC with Differential/Platelet -     Comprehensive metabolic panel -     Lipid panel -     Hemoglobin A1c -     Microalbumin / creatinine urine ratio  Chronic systolic CHF (congestive heart failure) Assessment & Plan: Not sure about whether he has orthopnea, following with Dr. Kirtland Bouchard Below knee amputation makes vol status difficult to evaluate    Chronic back pain, unspecified back location, unspecified back pain laterality  Hypertension, unspecified type Assessment & Plan: Due to long discussion on testosterone and mm relaxers we didn't make issue of this Will plan to encourage lisinopril increase if home reads are over 130/80   Medical Decision Making: 2 or more stable chronic illnesses Prescription drug management       Clinical Presentation:   57 y.o. male here today for 5 month follow-up, Testosterone replacement (Testoterone injection needs to be sent to [facility] and Med Change Request (Request a replacement for the methocarbomol for back pain.) Patient reports Been in wheelchair since mid December. Awaiting new prosthetics. Had staged 4 decubitus now closed, done follow up with wound care. So life is getting better.  Robaxin not much help anymore, wants to try different m/R   Updated chart data:  Problem  Chronic Back Pain    Been in wheelchair since mid December. Awaiting new prosthetics Had staged 4 decubitus now closed, done follow up with wound care Methocarbamol not helping much Gets opioids from physical medicine and rehabilitation  Looked over alternative muscle relaxer, safety is comparable except for soma which I avoid.  Chose baclofen due to his report of severe chronic spasm, to replace roobaxin   Chronic Systolic Chf (Congestive Heart Failure)  Hypogonadism in Male     Prior  history: Remote history steroid overuse / bodybuilding per patient report      Type 1 Diabetes Mellitus With Diabetic Polyneuropathy, With Long-Term Current Use of Insulin   Lab Results  Component Value Date   HGBA1C 7.1 (H) 12/23/2022   HGBA1C 11.9 (H) 06/15/2022   HGBA1C 8.6 (A) 12/05/2021   HGBA1C 8.6 12/05/2021   HGBA1C 8.6 (A) 12/05/2021   HGBA1C 8.6 (A) 12/05/2021    Used to be type 2 but he shows brisk response to insulin and no response to oral hypoglycemics so I changed this diagnosis to type 1 on 07/24/22 after he showed multiple severe hypoglycemias with insulin only.   Hypertensive Disorder    Current hypertension medications:       Sig   furosemide (LASIX) 20 MG tablet (Taking) Take 1 tablet (20 mg total) by mouth daily.   lisinopril (ZESTRIL) 10 MG tablet (Taking) Take 1 tablet (10 mg total) by mouth daily.   sildenafil (VIAGRA) 100 MG tablet (Taking) Take 100 mg by mouth daily as needed.      Lab Results  Component Value Date   NA 139 12/23/2022   K 4.2 12/23/2022        Reviewed chart data: Active Ambulatory Problems    Diagnosis Date Noted   Hypertensive disorder 09/25/2019   Urinary hesitancy  09/25/2019   Prosthesis adjustments 09/25/2019   Erectile dysfunction 09/25/2019   Palliative care status 09/25/2019   Pulmonary nodules 09/25/2019   Depressive disorder 09/25/2019   Type 1 diabetes mellitus with diabetic polyneuropathy, with long-term current use of insulin 10/12/2019   Right below-elbow amputee 10/12/2019   S/P bilateral below knee amputation 10/30/2019   Dyslipidemia 10/30/2019   Chronic diastolic (congestive) heart failure 05/16/2021   Paroxysmal atrial flutter 07/22/2021   Discitis of thoracic region    Pseudohyponatremia 09/13/2021   Normocytic anemia 09/13/2021   Insomnia 09/14/2021   Chronic systolic CHF (congestive heart failure) 09/14/2021   Hypogonadism in male 09/14/2021   Chronic pain 09/14/2021   Stage IV pressure ulcer  of sacral region 09/15/2021   Episode of unresponsiveness 12/15/2021   Acute respiratory failure with hypoxia (HCC) secondary to suspected aspiration pneumonia 12/15/2021   Renal mass 12/15/2021   Generalized anxiety disorder 01/21/2022   Amputation below knee 04/23/2022   H/O amputation of leg through tibia and fibula 11/10/2019   Degeneration of lumbar intervertebral disc 11/10/2019   Hypercholesterolemia 11/10/2019   Lumbar spondylosis 03/25/2021   PTSD (post-traumatic stress disorder) 04/23/2022   Malaise 06/22/2022   Polycythemia 06/22/2022   Brittle diabetes mellitus 07/24/2022   Chronic back pain 12/24/2022   Resolved Ambulatory Problems    Diagnosis Date Noted   Stage 3a chronic kidney disease (HCC) 09/25/2019   Hematuria 09/25/2019   Acquired complex renal cyst 09/25/2019   Type 2 diabetes mellitus 10/12/2019   Diabetic infection of left foot 04/17/2021   Shortness of breath 05/16/2021   Severe sepsis with acute organ dysfunction 07/22/2021   Chest pain 07/22/2021   AKI (acute kidney injury) 07/22/2021   Gas gangrene of foot 07/22/2021   Septic shock 07/22/2021   Systolic dysfunction    Sacral pressure injury of skin 07/27/2021   Recent bacteremia due to group B Streptococcus, Streptococcus agalactiae, thoracic discitis/osteomyelitis with epidural abscess    Sacral pain 09/13/2021   Leukocytosis 09/13/2021   Decubitus ulcer of sacral area 12/15/2021   Lactic acidosis 12/15/2021   Complicated UTI (urinary tract infection) 12/15/2021   Infected ulcer of skin 12/29/2021   Past Medical History:  Diagnosis Date   Cancer    Complication of anesthesia    Depression    Diabetes mellitus without complication    Healthcare maintenance 09/25/2019   History of kidney stones    Osteomyelitis     Outpatient Medications Prior to Visit  Medication Sig   aspirin EC 81 MG tablet Take 81 mg by mouth daily. Swallow whole.   blood glucose meter kit and supplies KIT Dispense  based on patient and insurance preference. Use up to four times daily as directed.   furosemide (LASIX) 20 MG tablet Take 1 tablet (20 mg total) by mouth daily.   glucose blood test strip Use up to six times a day.   insulin NPH-regular Human (NOVOLIN 70/30 RELION) (70-30) 100 UNIT/ML injection Inject 15 Units into the skin 2 (two) times daily with a meal. (Patient taking differently: Inject 3-12 Units into the skin 2 (two) times daily with a meal. Sliding scale)   lisinopril (ZESTRIL) 10 MG tablet Take 1 tablet (10 mg total) by mouth daily.   Oxycodone HCl 10 MG TABS Take 10 mg by mouth 3 (three) times daily as needed (Pain).   rosuvastatin (CRESTOR) 20 MG tablet Take 1 tablet (20 mg total) by mouth daily.   sildenafil (VIAGRA) 100 MG tablet Take 100 mg by  mouth daily as needed.   tamsulosin (FLOMAX) 0.4 MG CAPS capsule Take 0.4 mg by mouth daily.   [DISCONTINUED] methocarbamol (ROBAXIN) 500 MG tablet Take 1 tablet (500 mg total) by mouth 4 (four) times daily.   [DISCONTINUED] Continuous Blood Gluc Receiver (DEXCOM G6 RECEIVER) DEVI Please provide 1 receiver   [DISCONTINUED] Continuous Blood Gluc Sensor (DEXCOM G6 SENSOR) MISC New sensor every 10 days   [DISCONTINUED] Continuous Blood Gluc Transmit (DEXCOM G6 TRANSMITTER) MISC Please provide once every 3 months   [DISCONTINUED] Glucagon 1 MG/0.2ML SOAJ Inject 1 each into the skin daily as needed.   [DISCONTINUED] Insulin Infusion Pump (MINIMED 770G INSULIN PUMP SYS) KIT 1 each by Does not apply route daily as needed.   [DISCONTINUED] Insulin Pen Needle 32G X 4 MM MISC Use as directed   [DISCONTINUED] PRESCRIPTION MEDICATION Apply 10 Pump topically See admin instructions. Bassa-Gel Add 1 scoop (500mg ) of compounded powder and 10 pumps of Bassa-Gel to mixing jar, mix together until mixed completely. Apply the gel to the affected area(s) daily or with dressing changes as directed  [facility] , [place] [phone] (Patient not taking:  Reported on 07/24/2022)   [DISCONTINUED] PRESCRIPTION MEDICATION Apply 1 application  topically See admin instructions. Spc Gentamicin-Vancomycin-Clotrimazole 33-25-5% Powder   Add 1 scoop (500mg ) of compounded powder and 10 pumps of Bassa-Gel to mixing jar, mix together until mixed completely. Apply the gel to the affected area(s) daily or with dressing changes as directed  [facility] , [place] [phone] (Patient not taking: Reported on 07/24/2022)   [DISCONTINUED] testosterone cypionate (DEPO-TESTOSTERONE) 200 MG/ML injection Inject 1 mL (200 mg total) into the muscle every 14 (fourteen) days.   [DISCONTINUED] triamcinolone cream (KENALOG) 0.1 % Apply 1 Application topically daily as needed (Rash).   No facility-administered medications prior to visit.             Clinical Data Analysis:   Physical Exam  BP (!) 140/90 (BP Location: Right Arm, Patient Position: Sitting)   Pulse (!) 102   Temp 97.9 F (36.6 C) (Temporal)   Ht 6\' 3"  (1.905 m)   SpO2 96%   BMI 25.25 kg/m  Wt Readings from Last 10 Encounters:  06/15/22 202 lb (91.6 kg)  01/21/22 208 lb (94.3 kg)  12/31/21 208 lb (94.3 kg)  09/14/21 240 lb 4.8 oz (109 kg)  08/01/21 235 lb 14.3 oz (107 kg)  05/16/21 240 lb (108.9 kg)  04/24/21 238 lb (108 kg)  04/05/20 (!) 214 lb (97.1 kg)  04/04/20 (!) 229 lb (103.9 kg)  10/12/19 229 lb (103.9 kg)   Vital signs reviewed.  Nursing notes reviewed. Weight trend reviewed. Abnormalities and Problem-Specific physical exam findings:  in wheelchair good  muscle tone General Appearance:  No acute distress appreciable.   Well-groomed, healthy-appearing male.  Well proportioned with no abnormal fat distribution.  Good muscle tone. Skin: Clear and well-hydrated. Pulmonary:  Normal work of breathing at rest, no respiratory distress apparent. SpO2: 96 %  Musculoskeletal: Below knee amputation of left lower extremity is noted. Below knee amputation of right lower extremity is  noted. Neurological:  Awake, alert, oriented, and engaged.  No obvious focal neurological deficits or cognitive impairments.  Sensorium seems unclouded. Gait is smooth and coordinated.  Speech is clear and coherent with logical content. Psychiatric:  Appropriate mood, pleasant and cooperative demeanor, cheerful and engaged during the exam   Additional Results Reviewed:     Results for orders placed or performed in visit on 12/23/22  Testosterone  Result Value Ref Range   Testosterone 853.25 300.00 - 890.00 ng/dL  CBC with Differential/Platelet  Result Value Ref Range   WBC 6.2 4.0 - 10.5 K/uL   RBC 6.27 (H) 4.22 - 5.81 Mil/uL   Hemoglobin 19.4 Repeated and verified X2. (HH) 13.0 - 17.0 g/dL   HCT 24.4 Repeated and verified X2. (H) 39.0 - 52.0 %   MCV 90.3 78.0 - 100.0 fl   MCHC 34.2 30.0 - 36.0 g/dL   RDW 01.0 27.2 - 53.6 %   Platelets 193.0 150.0 - 400.0 K/uL   Neutrophils Relative % 76.9 43.0 - 77.0 %   Lymphocytes Relative 11.7 (L) 12.0 - 46.0 %   Monocytes Relative 9.3 3.0 - 12.0 %   Eosinophils Relative 1.8 0.0 - 5.0 %   Basophils Relative 0.3 0.0 - 3.0 %   Neutro Abs 4.7 1.4 - 7.7 K/uL   Lymphs Abs 0.7 0.7 - 4.0 K/uL   Monocytes Absolute 0.6 0.1 - 1.0 K/uL   Eosinophils Absolute 0.1 0.0 - 0.7 K/uL   Basophils Absolute 0.0 0.0 - 0.1 K/uL  Comprehensive metabolic panel  Result Value Ref Range   Sodium 139 135 - 145 mEq/L   Potassium 4.2 3.5 - 5.1 mEq/L   Chloride 103 96 - 112 mEq/L   CO2 24 19 - 32 mEq/L   Glucose, Bld 228 (H) 70 - 99 mg/dL   BUN 25 (H) 6 - 23 mg/dL   Creatinine, Ser 6.44 0.40 - 1.50 mg/dL   Total Bilirubin 0.9 0.2 - 1.2 mg/dL   Alkaline Phosphatase 100 39 - 117 U/L   AST 30 0 - 37 U/L   ALT 55 (H) 0 - 53 U/L   Total Protein 6.8 6.0 - 8.3 g/dL   Albumin 4.2 3.5 - 5.2 g/dL   GFR 03.47 >42.59 mL/min   Calcium 9.6 8.4 - 10.5 mg/dL  Lipid panel  Result Value Ref Range   Cholesterol 124 0 - 200 mg/dL   Triglycerides 563.8 0.0 - 149.0 mg/dL   HDL  75.64 >33.29 mg/dL   VLDL 51.8 0.0 - 84.1 mg/dL   LDL Cholesterol 51 0 - 99 mg/dL   Total CHOL/HDL Ratio 3    NonHDL 79.26   Hemoglobin A1c  Result Value Ref Range   Hgb A1c MFr Bld 7.1 (H) 4.6 - 6.5 %    Recent Results (from the past 2160 hour(s))  Testosterone     Status: None   Collection Time: 12/23/22  2:23 PM  Result Value Ref Range   Testosterone 853.25 300.00 - 890.00 ng/dL  CBC with Differential/Platelet     Status: Abnormal   Collection Time: 12/23/22  2:23 PM  Result Value Ref Range   WBC 6.2 4.0 - 10.5 K/uL   RBC 6.27 (H) 4.22 - 5.81 Mil/uL   Hemoglobin 19.4 Repeated and verified X2. (HH) 13.0 - 17.0 g/dL   HCT 66.0 Repeated and verified X2. (H) 39.0 - 52.0 %   MCV 90.3 78.0 - 100.0 fl   MCHC 34.2 30.0 - 36.0 g/dL   RDW 63.0 16.0 - 10.9 %   Platelets 193.0 150.0 - 400.0 K/uL   Neutrophils Relative % 76.9 43.0 - 77.0 %   Lymphocytes Relative 11.7 (L) 12.0 - 46.0 %   Monocytes Relative 9.3 3.0 - 12.0 %   Eosinophils Relative 1.8 0.0 - 5.0 %   Basophils Relative 0.3 0.0 - 3.0 %   Neutro Abs 4.7 1.4 - 7.7 K/uL  Lymphs Abs 0.7 0.7 - 4.0 K/uL   Monocytes Absolute 0.6 0.1 - 1.0 K/uL   Eosinophils Absolute 0.1 0.0 - 0.7 K/uL   Basophils Absolute 0.0 0.0 - 0.1 K/uL  Comprehensive metabolic panel     Status: Abnormal   Collection Time: 12/23/22  2:23 PM  Result Value Ref Range   Sodium 139 135 - 145 mEq/L   Potassium 4.2 3.5 - 5.1 mEq/L   Chloride 103 96 - 112 mEq/L   CO2 24 19 - 32 mEq/L   Glucose, Bld 228 (H) 70 - 99 mg/dL   BUN 25 (H) 6 - 23 mg/dL   Creatinine, Ser 2.84 0.40 - 1.50 mg/dL   Total Bilirubin 0.9 0.2 - 1.2 mg/dL   Alkaline Phosphatase 100 39 - 117 U/L   AST 30 0 - 37 U/L   ALT 55 (H) 0 - 53 U/L   Total Protein 6.8 6.0 - 8.3 g/dL   Albumin 4.2 3.5 - 5.2 g/dL   GFR 13.24 >40.10 mL/min    Comment: Calculated using the CKD-EPI Creatinine Equation (2021)   Calcium 9.6 8.4 - 10.5 mg/dL  Lipid panel     Status: None   Collection Time: 12/23/22  2:23  PM  Result Value Ref Range   Cholesterol 124 0 - 200 mg/dL    Comment: ATP III Classification       Desirable:  < 200 mg/dL               Borderline High:  200 - 239 mg/dL          High:  > = 272 mg/dL   Triglycerides 536.6 0.0 - 149.0 mg/dL    Comment: Normal:  <440 mg/dLBorderline High:  150 - 199 mg/dL   HDL 34.74 >25.95 mg/dL   VLDL 63.8 0.0 - 75.6 mg/dL   LDL Cholesterol 51 0 - 99 mg/dL   Total CHOL/HDL Ratio 3     Comment:                Men          Women1/2 Average Risk     3.4          3.3Average Risk          5.0          4.42X Average Risk          9.6          7.13X Average Risk          15.0          11.0                       NonHDL 79.26     Comment: NOTE:  Non-HDL goal should be 30 mg/dL higher than patient's LDL goal (i.e. LDL goal of < 70 mg/dL, would have non-HDL goal of < 100 mg/dL)  Hemoglobin E3P     Status: Abnormal   Collection Time: 12/23/22  2:23 PM  Result Value Ref Range   Hgb A1c MFr Bld 7.1 (H) 4.6 - 6.5 %    Comment: Glycemic Control Guidelines for People with Diabetes:Non Diabetic:  <6%Goal of Therapy: <7%Additional Action Suggested:  >8%     No image results found.   No results found.   --------------------------------    Signed: Lula Olszewski, MD 12/24/2022 7:08 PM

## 2022-12-23 NOTE — Assessment & Plan Note (Signed)
Not sure about whether he has orthopnea, following with Dr. Kirtland Bouchard Below knee amputation makes vol status difficult to evaluate

## 2022-12-23 NOTE — Assessment & Plan Note (Signed)
Doesn't have endocriologist and states "that's too many doctors right now" Can't get to eye exam due to difficulty walking associated with amputations Reports his sugars are good

## 2022-12-24 ENCOUNTER — Encounter: Payer: Self-pay | Admitting: Internal Medicine

## 2022-12-24 ENCOUNTER — Telehealth: Payer: Self-pay | Admitting: Internal Medicine

## 2022-12-24 DIAGNOSIS — G8929 Other chronic pain: Secondary | ICD-10-CM | POA: Insufficient documentation

## 2022-12-24 MED ORDER — TESTOSTERONE CYPIONATE 200 MG/ML IM SOLN: 200.0000 mg | INTRAMUSCULAR | 0 refills | Status: DC

## 2022-12-24 NOTE — Assessment & Plan Note (Addendum)
Upon reviewing the patient's health status and current medications, it's evident that he has a complex medical history with multiple comorbidities that could influence the management of his testosterone replacement therapy. The patient's self-reported symptoms of mental depression and tiredness could be multifactorial, given his history of depressive disorder, chronic pain, and other significant health issues.  He would prefer to double current injection(s) to 200 mg weekly from every 14 days (semiweekly)   For testosterone replacement therapy, I explained  the goal is to achieve and maintain serum testosterone levels within the normal physiological range. The patient's current regimen of 200 mg every 14 days may need adjustment based on his symptoms and serum testosterone levels, but any decision to increase the dosage should be made cautiously, considering the potential for adverse effects, especially in the context of his cardiovascular and metabolic conditions.  The 6 months ago testosterone was middle of normal range when tested.    I offered to continue current dosing interval for now, and we will recheck levels and engage in further shared decision making after his request, after we have fresh testosterone levels.  Lab Results  Component Value Date   TESTOSTERONE 853.25 12/23/2022   Lab Results  Component Value Date/Time   HGB 19.4 Repeated and verified X2. (HH) 12/23/2022 02:23 PM   HGB 17.7 (H) 07/03/2022 01:38 PM   HGB 18.6 (H) 06/15/2022 02:32 PM   HGB 14.3 12/31/2021 01:11 AM   HGB 14.6 12/30/2021 12:47 AM   HGB 15.8 12/05/2021 11:58 AM   Also I have  biggest concern(s) about prescribing the higher dose is that: Testosterone supplementation may potentially increase the risk of heart attack, particularly in men with pre-existing cardiovascular conditions, including those with a history of heart failure. Therefore, it is generally contraindicated or recommended with caution in patients  with a history of heart failure, unless there is a compelling indication for its use, such as clinically significant hypogonadism with both symptoms and biochemical confirmation. In such cases, patients should be closely monitored for cardiovascular and heart failure symptoms throughout the therapy. The decision to initiate testosterone supplementation in these patients should be made on an individual basis, considering the potential risks and benefits and engaging in a shared decision-making process with the patient.  My preference is to discontinue or reduce testosterone dose so we will have more risks and benefits discussion about this with me encouraging him to reduce dose, but not taking it away without his consent.

## 2022-12-24 NOTE — Telephone Encounter (Signed)
On call provider, Dr Jonny Ruiz, took the [facility] & routed to pcp  Patient Name: [name] Gender: Male DOB: [birth year] Age: 57 Y 16 M 2 D Return Phone Number: [phone] (Primary) Address: City/ State/ Zip: [place]  [address] Client [facility] - Human resources officer [facility] [employer] Provider Glenetta Hew- MD Contact Type Call Who Is Calling Lab / Radiology Lab Name [facility] Lab Phone Number [phone] Lab Tech Name [facility] Lab Reference Number N/A Chief Complaint Lab Result (Critical or Stat) Call Type Lab Send to RN Reason for Call Report lab results Initial Comment Caller states they have a critical result for a patient. Translation No Nurse Assessment Nurse: Chilton Si, RN, Tanika Date/Time (Eastern Time): 12/23/2022 5:26:28 PM Is there an on-call provider listed? ---Yes Please list name of person reporting value (Lab Employee) and a contact number. ---Larita Fife [phone] Please document the following items: Lab name Lab value (read back to lab to verify) Reference range for lab value Date and time blood was drawn Collect time of birth for bilirubin results ---hemoglobin 19.4 (range 13-18) hematocrit 56.6 (range 39-52) previous Oct 2023 hemoglobin 17.7 Disp. Time Lamount Cohen Time) Disposition Final User 12/23/2022 5:32:02 PM Paged On Call back to Call [place], RN, Alcario Drought 12/23/2022 5:33:07 PM Lab Call Chilton Si, RN, Alcario Drought Reason: hemoglobin 19.4 hematocrit56.6 12/23/2022 5:33:14 PM Paged On Call back to [facility], RN, Alcario Drought 12/23/2022 5:58:24 PM Clinical Call Yes Chilton Si, RN, Alcario Drought Final Disposition 12/23/2022 5:58:24 PM Clinical Call Yes Chilton Si, RN, [facility] Phone DateTime Result/ Outcome Message Type Notes Oliver Barre- MD [phone] 12/23/2022 5:32:02 PM Paged On Call Back to Call Center Doctor Paged Oliver Barre- MD 12/23/2022 5:58:17 PM Spoke with On Call - General  Message Result provider notified of labs

## 2022-12-24 NOTE — Assessment & Plan Note (Signed)
Due to long discussion on testosterone and mm relaxers we didn't make issue of this Will plan to encourage lisinopril increase if home reads are over 130/80

## 2022-12-24 NOTE — Assessment & Plan Note (Addendum)
>>  ASSESSMENT AND PLAN FOR TYPE 1 DIABETES MELLITUS WITH DIABETIC POLYNEUROPATHY, WITH LONG-TERM CURRENT USE OF INSULIN WRITTEN ON 12/24/2022  6:46 PM BY Lula Olszewski, MD  We briefly discussed the potential benefits of following with endocrinology and doing more intensive f/u but he expressed a preference to not move forward with my offer(s) to facilitate the proposed intervention(s) at this time. We will revisit this option if needed at future visits and  he is always encouraged to contact me if the situation changes or he changes his mind.   Patient reports its controlled at home and labwork supports that    >>ASSESSMENT AND PLAN FOR TYPE 1 DIABETES MELLITUS WITH COMPLICATIONS WRITTEN ON 12/23/2022  2:06 PM BY Lula Olszewski, MD  Doesn't have endocriologist and states "that's too many doctors right now" Can't get to eye exam due to difficulty walking associated with amputations Reports his sugars are good

## 2022-12-25 ENCOUNTER — Other Ambulatory Visit: Payer: Self-pay | Admitting: Internal Medicine

## 2022-12-25 DIAGNOSIS — E291 Testicular hypofunction: Secondary | ICD-10-CM

## 2022-12-25 NOTE — Telephone Encounter (Signed)
Patient was seen on 4/17 for an OV, Methocarbamol was replaced with Baclofen.

## 2022-12-25 NOTE — Telephone Encounter (Signed)
Patient states [facility] informed him that medication below hasn't been approved by pcp but he sees it has. Pt requests Tiffany give him a callback.

## 2022-12-29 NOTE — Telephone Encounter (Signed)
Patient called to speak with Tiffany regarding rx. Informed him that per Tiffany she will be contacting pharmacy for updates prior to contacting him. Pt verbalized understanding.

## 2023-01-01 DIAGNOSIS — Z89512 Acquired absence of left leg below knee: Secondary | ICD-10-CM | POA: Diagnosis not present

## 2023-01-01 DIAGNOSIS — Z89511 Acquired absence of right leg below knee: Secondary | ICD-10-CM | POA: Diagnosis not present

## 2023-01-05 DIAGNOSIS — G546 Phantom limb syndrome with pain: Secondary | ICD-10-CM | POA: Diagnosis not present

## 2023-01-05 DIAGNOSIS — E1101 Type 2 diabetes mellitus with hyperosmolarity with coma: Secondary | ICD-10-CM | POA: Diagnosis not present

## 2023-01-08 ENCOUNTER — Telehealth: Payer: Self-pay | Admitting: Pharmacist

## 2023-01-08 DIAGNOSIS — I5032 Chronic diastolic (congestive) heart failure: Secondary | ICD-10-CM

## 2023-01-08 NOTE — Telephone Encounter (Signed)
This patient has been identified as "high risk" and in the top 25% of risk stratification for [facility] accountable patients.  These patients were identified using a number of factors including # of hospitalizations, ED visits, HF exacerbations, elevated BP and A1c, and overall cost of care.   Referral placed for cosign by the PCP.  CMCS team to schedule once cosigned.  Philomene Haff, PharmD Clinical Pharmacist  [place] [phone]  

## 2023-01-18 ENCOUNTER — Telehealth: Payer: Self-pay | Admitting: Pharmacist

## 2023-01-18 NOTE — Progress Notes (Signed)
Care Management & Coordination Services Pharmacy Team  Reason for Encounter: Appointment Reminder  Contacted patient to confirm telephone appointment with Erskine Emery, PharmD on 01/22/2023 at 2 pm. Spoke with patient on 01/18/2023   Do you have any problems getting your medications? No If yes what types of problems are you experiencing?  None  What is your top health concern you would like to discuss at your upcoming visit? Patient states his prosthetics do not fit so he is unable to walk at this time.  Have you seen any other providers since your last visit with PCP? No   Chart review:  Recent office visits:  12/23/2022 OV (PCP) Lula Olszewski, MD; no medication changes indicated.  Recent consult visits:  11/10/2022 OV (Orthopedics) Nadara Mustard, MD; no medication changes indicated.  10/08/2022 OV (Orthopedics) Nadara Mustard, MD; no medication changes indicated.  09/04/2022 OV (Orthopedics) Nadara Mustard, MD; no medication changes indicated.  Hospital visits:  None in previous 6 months   Star Rating Drugs:  Lisinopril 10 mg last filled 12/28/2022 30 DS Rosuvastatin 20 mg last filled 01/15/2023 90 DS   Care Gaps: Annual wellness visit in last year? No  If Diabetic: Last eye exam / retinopathy screening: overdue Last diabetic foot exam: Discontinued   Future Appointments  Date Time Provider Department Center  01/22/2023  2:00 PM Erroll Luna, [facility] None  06/24/2023  1:20 PM Lula Olszewski, MD [facility]   April D Calhoun, [facility] Clinical Pharmacist Assistant [phone]

## 2023-01-21 ENCOUNTER — Other Ambulatory Visit: Payer: Self-pay | Admitting: Internal Medicine

## 2023-01-21 DIAGNOSIS — M62838 Other muscle spasm: Secondary | ICD-10-CM

## 2023-01-22 ENCOUNTER — Ambulatory Visit: Payer: Medicare HMO | Admitting: Pharmacist

## 2023-01-22 NOTE — Progress Notes (Signed)
Care Management & Coordination Services Pharmacy Note  01/22/2023 Name:  [name] MRN:  [id number] DOB:  February 27, 1966  Summary: Initial visit with PharmD.  Chronic conditions fairly well managed.  We did discuss recent labs and elevated Hgb.  Patient aware of stroke risk continuing to take testosterone.  After discussion he did agree to possible dose reduction to see if this helps Hgb levels.  Recommendations/Changes made from today's visit: Consider 0.5 ml every 14 days for testosterone pending PCP discussion  Follow up plan: FU 6 months   Subjective: [name] is an 57 y.o. year old male who is a primary patient of Lula Olszewski, MD.  The care coordination team was consulted for assistance with disease management and care coordination needs.    Engaged with patient by telephone for initial visit.  Recent office visits:  12/23/2022 OV (PCP) Lula Olszewski, MD; no medication changes indicated.   Recent consult visits:  11/10/2022 OV (Orthopedics) Nadara Mustard, MD; no medication changes indicated.   10/08/2022 OV (Orthopedics) Nadara Mustard, MD; no medication changes indicated.   09/04/2022 OV (Orthopedics) Nadara Mustard, MD; no medication changes indicated.   Hospital visits:  None in previous 6 months   Objective:  Lab Results  Component Value Date   CREATININE 1.19 12/23/2022   BUN 25 (H) 12/23/2022   GFR 68.09 12/23/2022   EGFR 88 12/05/2021   GFRNONAA 47 (L) 06/15/2022   GFRAA 57 (L) 09/13/2019   NA 139 12/23/2022   K 4.2 12/23/2022   CALCIUM 9.6 12/23/2022   CO2 24 12/23/2022   GLUCOSE 228 (H) 12/23/2022    Lab Results  Component Value Date/Time   HGBA1C 7.1 (H) 12/23/2022 02:23 PM   HGBA1C 11.9 (H) 06/15/2022 02:32 PM   HGBA1C 8.6 12/05/2021 11:25 AM   HGBA1C 8.6 (A) 12/05/2021 11:25 AM   HGBA1C 8.6 (A) 12/05/2021 11:25 AM   GFR 68.09 12/23/2022 02:23 PM   GFR 73.47 07/03/2022 01:38 PM    Last diabetic Eye exam: No results found for:  "HMDIABEYEEXA"  Last diabetic Foot exam: No results found for: "HMDIABFOOTEX"   Lab Results  Component Value Date   CHOL 124 12/23/2022   HDL 45.20 12/23/2022   LDLCALC 51 12/23/2022   TRIG 141.0 12/23/2022   CHOLHDL 3 12/23/2022       Latest Ref Rng & Units 12/23/2022    2:23 PM 07/03/2022    1:38 PM 12/28/2021    9:06 PM  Hepatic Function  Total Protein 6.0 - 8.3 g/dL 6.8  6.8  6.3   Albumin 3.5 - 5.2 g/dL 4.2  4.0  3.2   AST 0 - 37 U/L 30  24  22    ALT 0 - 53 U/L 55  29  22   Alk Phosphatase 39 - 117 U/L 100  73  97   Total Bilirubin 0.2 - 1.2 mg/dL 0.9  0.5  0.4     No results found for: "TSH", "FREET4"     Latest Ref Rng & Units 12/23/2022    2:23 PM 07/03/2022    1:38 PM 06/15/2022    2:32 PM  CBC  WBC 4.0 - 10.5 K/uL 6.2  4.6  5.4   Hemoglobin 13.0 - 17.0 g/dL 40.9 Repeated and verified X2.  17.7  18.6   Hematocrit 39.0 - 52.0 % 56.6 Repeated and verified X2.  53.3  55.1   Platelets 150.0 - 400.0 K/uL 193.0  179.0  194  No results found for: "VD25OH", "VITAMINB12"  Clinical ASCVD: No  The ASCVD Risk score (Arnett DK, et al., 2019) failed to calculate for the following reasons:   The valid total cholesterol range is 130 to 320 mg/dL        1/61/0960    4:54 PM 04/23/2022    1:19 PM 04/06/2022    2:07 PM  Depression screen PHQ 2/9  Decreased Interest 3 0 0  Down, Depressed, Hopeless 3 3 2   PHQ - 2 Score 6 3 2   Altered sleeping 3 3 1   Tired, decreased energy 2 3 1   Change in appetite 1 0 0  Feeling bad or failure about yourself  2 3 0  Trouble concentrating 2 3 0  Moving slowly or fidgety/restless 0 0 0  Suicidal thoughts 0 0 0  PHQ-9 Score 16 15 4   Difficult doing work/chores Not difficult at all Extremely dIfficult      Social History   Tobacco Use  Smoking Status Never  Smokeless Tobacco Current   Types: Chew   BP Readings from Last 3 Encounters:  12/23/22 (!) 140/90  07/24/22 138/82  06/22/22 138/66   Pulse Readings from Last 3  Encounters:  12/23/22 (!) 102  07/24/22 (!) 108  06/22/22 (!) 107   Wt Readings from Last 3 Encounters:  06/15/22 202 lb (91.6 kg)  01/21/22 208 lb (94.3 kg)  12/31/21 208 lb (94.3 kg)   BMI Readings from Last 3 Encounters:  12/23/22 25.25 kg/m  07/24/22 25.25 kg/m  06/22/22 25.25 kg/m    Allergies  Allergen Reactions   Bactrim [Sulfamethoxazole-Trimethoprim] Other (See Comments)    Effect Kidney   Ciprofloxacin Other (See Comments)    Kidney function    Levaquin [Levofloxacin] Other (See Comments)    Effect kidney function    Medications Reviewed Today     Reviewed by Erroll Luna, [facility] (Pharmacist) on 01/22/23 at 1525  Med List Status: <None>   Medication Order Taking? Sig Documenting Provider Last Dose Status Informant  aspirin EC 81 MG tablet [id number] Yes Take 81 mg by mouth daily. Swallow whole. [provider] Taking Active   baclofen (LIORESAL) 10 MG tablet [id number] Yes TAKE 1 TABLET BY MOUTH THREE TIMES DAILY Lula Olszewski, MD Taking Active   blood glucose meter kit and supplies KIT [id number] Yes Dispense based on patient and insurance preference. Use up to four times daily as directed. Orion Crook I, NP Taking Active Self  furosemide (LASIX) 20 MG tablet [id number] Yes Take 1 tablet (20 mg total) by mouth daily. Sharlene Dory, [place] Taking Active Self  glucose blood test strip [id number]  Use up to six times a day. Lula Olszewski, MD  Active   insulin NPH-regular Human (NOVOLIN 70/30 RELION) (70-30) 100 UNIT/ML injection [id number] Yes Inject 15 Units into the skin 2 (two) times daily with a meal.  Patient taking differently: Inject 3-12 Units into the skin 2 (two) times daily with a meal. Sliding scale   Dorcas Carrow, MD Taking Active Self  lisinopril (ZESTRIL) 10 MG tablet [id number] Yes Take 1 tablet (10 mg total) by mouth daily. Sharlene Dory, [place] Taking Active Self  Oxycodone HCl 10 MG TABS [id number] Yes Take 10 mg by mouth 3 (three)  times daily as needed (Pain). [provider] Taking Active Self           Med Note Earlene Plater, Rita Ohara   Fri Jan 22, 2023  2:14 PM) Taking 5 times  per day  pregabalin (LYRICA) 75 MG capsule [id number] Yes Take 75 mg by mouth 2 (two) times daily. [provider] Taking Active   rosuvastatin (CRESTOR) 20 MG tablet [id number] Yes Take 1 tablet (20 mg total) by mouth daily. Lula Olszewski, MD Taking Active   sildenafil (VIAGRA) 100 MG tablet [id number] Yes Take 100 mg by mouth daily as needed. [provider] Taking Active   tamsulosin (FLOMAX) 0.4 MG CAPS capsule [id number] Yes Take 0.4 mg by mouth daily. [provider] Taking Active Self  testosterone cypionate (DEPO-TESTOSTERONE) 200 MG/ML injection [id number] Yes Inject 1 mL (200 mg total) into the muscle every 14 (fourteen) days. Significant Risks of heart attack, stroke with continued use. Reducing dose as tolerated highly recommended Lula Olszewski, MD Taking Active             SDOH:  (Social Determinants of Health) assessments and interventions performed: Yes Financial Resource Strain: Low Risk  (01/22/2023)   Overall Financial Resource Strain (CARDIA)    Difficulty of Paying Living Expenses: Not hard at all   Food Insecurity: No Food Insecurity (01/22/2023)   Hunger Vital Sign    Worried About Running Out of Food in the Last Year: Never true    Ran Out of Food in the Last Year: Never true    SDOH Interventions    Flowsheet Row Video Visit from 01/21/2022 in [facility]  SDOH Interventions   Depression Interventions/Treatment  Patient refuses Treatment       Medication Assistance: None required.  Patient affirms current coverage meets needs.  Medication Access: Name and location of current pharmacy:  [id number] - [place] ([place]), [place] - [address] [place] ([place]) [place] [address] Phone: [phone] Fax:  [phone]  [id number] - [place],  - [address] AT [facility] [address] [place] [address] Phone: [phone] Fax: [phone]  Within the past 30 days, how often has patient missed a dose of medication? 0 Is a pillbox or other method used to improve adherence? Yes  Factors that may affect medication adherence? no barriers identified Are meds synced by current pharmacy? No  Are meds delivered by current pharmacy? No  Does patient experience delays in picking up medications due to transportation concerns? No   Compliance/Adherence/Medication fill history: Star Rating Drugs:  Lisinopril 10 mg last filled 12/28/2022 30 DS Rosuvastatin 20 mg last filled 01/15/2023 90 DS     Care Gaps: Annual wellness visit in last year? No   If Diabetic: Last eye exam / retinopathy screening: overdue Last diabetic foot exam: Discontinued   Assessment/Plan   Hyperlipidemia: (LDL goal < 70) -Controlled, most recent LDL is 51 -Current treatment: Rosuvastatin 20mg  Appropriate, Effective, Safe, Accessible -Medications previously tried: none noted   -Educated on Cholesterol goals;  Benefits of statin for ASCVD risk reduction; Importance of limiting foods high in cholesterol; -Recommended to continue current medication Continue routine screenings.  Tolerating medication well at this time.  Taking daily with no concerns.  Diabetes (A1c goal <7%) -Not ideally controlled, borderline at goal.  Most recent LDL is 7.1% -Current medications: Novolin 70/30 via sliding scale Appropriate, Effective, Safe, Accessible -Medications previously tried: none noted  -Current home glucose readings fasting glucose: 133 this morning post prandial glucose: no logs discussed -Denies hypoglycemic/hyperglycemic symptoms -Current meal patterns:  -Current exercise: currently unable to exercise due to prosthetic legs not fitting correctly.  He is  in the process of working this out with prosthetic company to be able to exercise. -Educated on A1c and blood sugar goals; Exercise goal of 150 minutes per week; Benefits of weight loss; -Counseled to check feet daily and get yearly eye exams -Recommended to continue current medication No changes at this time , CMA to check on glucose in 3 months.  Heart Failure (Goal: manage symptoms and prevent exacerbations) -Controlled -Last ejection fraction: 35-40%  -HF type: HFrEF (EF < 40%) -NYHA Class: I (no actitivty limitation) -AHA HF Stage: B (Heart disease present - no symptoms present) -Current treatment: Furosemide 20mg  daily Appropriate, Effective, Safe, Accessible -Medications previously tried: none noted  -Current home BP/HR readings: normal, HR has been elevated in office -Educated on Benefits of medications for managing symptoms and prolonging life Importance of weighing daily; if you gain more than 3 pounds in one day or 5 pounds in one week, contact providers -Counseled on diet and exercise extensively Recommended to continue current medication   Willa Frater, PharmD Clinical Pharmacist  [facility] [phone]

## 2023-01-25 ENCOUNTER — Telehealth: Payer: Self-pay | Admitting: Pharmacist

## 2023-01-25 NOTE — Telephone Encounter (Signed)
-----   Message from Lula Olszewski, MD sent at 01/24/2023  3:41 PM EDT ----- Appreciate you talking him into cutting his dose. He wasn't ready for that conversation with me. Of course my preference is for him to stop completely but I don't want to rob him of his agency so that's a good compromise.  He can implement the agreed upon change with no intervention on my part. ----- Message ----- From: Erroll Luna, [facility] Sent: 01/22/2023   3:26 PM EDT To: Lula Olszewski, MD  Discussed with patient the risk of testosterone and his elevated Hgb for stroke/MI.  He did not want to stop completely but I discussed a potential reduced dose to 0.35ml every 14 days.  This decreases his testosterone dose by 50%.  Would you be on board with making that change considering he is not wanting to stop completely?  Willa Frater, PharmD Clinical Pharmacist  [facility] [phone]

## 2023-01-25 NOTE — Telephone Encounter (Signed)
Patient notified ok to change to 0.36ml every 14 days until he sees Dr. Jon Billings again for labs.  Willa Frater, PharmD Clinical Pharmacist  [facility] [phone]

## 2023-02-04 ENCOUNTER — Ambulatory Visit: Payer: Medicare HMO | Admitting: Orthopedic Surgery

## 2023-02-13 DIAGNOSIS — M47816 Spondylosis without myelopathy or radiculopathy, lumbar region: Secondary | ICD-10-CM | POA: Diagnosis not present

## 2023-02-23 ENCOUNTER — Other Ambulatory Visit: Payer: Self-pay | Admitting: Internal Medicine

## 2023-02-23 DIAGNOSIS — M62838 Other muscle spasm: Secondary | ICD-10-CM

## 2023-02-25 ENCOUNTER — Ambulatory Visit: Payer: Medicare HMO | Admitting: Orthopedic Surgery

## 2023-02-25 DIAGNOSIS — Z89512 Acquired absence of left leg below knee: Secondary | ICD-10-CM

## 2023-02-25 DIAGNOSIS — Z89511 Acquired absence of right leg below knee: Secondary | ICD-10-CM

## 2023-02-26 ENCOUNTER — Telehealth: Payer: Self-pay | Admitting: Orthopedic Surgery

## 2023-02-26 NOTE — Telephone Encounter (Signed)
Dr. Lajoyce Corners is out of the office today, he is in surgery all day today. I will let him know on Monday to complete the OV and once that is signed off he can view on his mychart. That is something we do not do manually to put on his mychart.

## 2023-02-26 NOTE — Telephone Encounter (Signed)
Pt called stating Dr Lajoyce Corners has no notes on his appt yesterday and pt states he has to give note from appt to [facility] clinic. Please put note on yesterday's visit to pt mychart. Pt phone number is [phone].

## 2023-02-28 ENCOUNTER — Other Ambulatory Visit: Payer: Self-pay | Admitting: Internal Medicine

## 2023-02-28 DIAGNOSIS — E1165 Type 2 diabetes mellitus with hyperglycemia: Secondary | ICD-10-CM

## 2023-03-01 ENCOUNTER — Encounter: Payer: Self-pay | Admitting: Orthopedic Surgery

## 2023-03-01 NOTE — Progress Notes (Signed)
Office Visit Note   Patient: [name]           Date of Birth: 1966/06/29           MRN: [id number] Visit Date: 02/25/2023              Requested by: Lula Olszewski, MD [address] [place],  [place] [address] PCP: Lula Olszewski, MD  Chief Complaint  Patient presents with   Right Leg - Follow-up      HPI: Patient is a 57 year old gentleman who is status post bilateral transtibial amputations.  Patient has no problem with the fitting of the left prosthesis.  Patient has been subside and developing pressure on the residual limb on the right and pain with activities of daily living.  Assessment & Plan: Visit Diagnoses:  1. S/P bilateral below knee amputation [facility])     Plan: Patient will follow-up with [facility] to evaluate for socket modification versus revision socket.  Follow-Up Instructions: No follow-ups on file.   Ortho Exam  Patient is alert, oriented, no adenopathy, well-dressed, normal affect, normal respiratory effort. Examination patient does have medial and lateral padding in the socket on the right.  Where the pads are located patient is developing pressure ulcer on the lateral aspect of his leg.  He has subsided into the socket and has pressure on the patella medially and laterally as well as pressure over the lateral anterior compartment.  Patient is an existing right transtibial  amputee.  Patient's current comorbidities are not expected to impact the ability to function with the prescribed prosthesis. Patient verbally communicates a strong desire to use a prosthesis. Patient currently requires mobility aids to ambulate without a prosthesis.  Expects not to use mobility aids with a new prosthesis.  Patient is a K3 level ambulator that spends a lot of time walking around on uneven terrain over obstacles, up and down stairs, and ambulates with a variable cadence.     Imaging: No results found. No images are attached to the  encounter.  Labs: Lab Results  Component Value Date   HGBA1C 7.1 (H) 12/23/2022   HGBA1C 11.9 (H) 06/15/2022   HGBA1C 8.6 (A) 12/05/2021   HGBA1C 8.6 12/05/2021   HGBA1C 8.6 (A) 12/05/2021   HGBA1C 8.6 (A) 12/05/2021   ESRSEDRATE 25 (H) 07/03/2022   ESRSEDRATE 11 12/30/2021   ESRSEDRATE 12 12/29/2021   CRP <1.0 07/03/2022   CRP 0.7 12/30/2021   CRP 1.1 (H) 12/29/2021   REPTSTATUS 04/11/2022 FINAL 04/09/2022   CULT >=100,000 COLONIES/mL PSEUDOMONAS AERUGINOSA (A) 04/09/2022   LABORGA PSEUDOMONAS AERUGINOSA (A) 04/09/2022     Lab Results  Component Value Date   ALBUMIN 4.2 12/23/2022   ALBUMIN 4.0 07/03/2022   ALBUMIN 3.2 (L) 12/28/2021    Lab Results  Component Value Date   MG 1.7 12/18/2021   MG 1.4 (L) 12/16/2021   MG 1.5 (L) 08/15/2021   No results found for: "VD25OH"  No results found for: "PREALBUMIN"    Latest Ref Rng & Units 12/23/2022    2:23 PM 07/03/2022    1:38 PM 06/15/2022    2:32 PM  CBC EXTENDED  WBC 4.0 - 10.5 K/uL 6.2  4.6  5.4   RBC 4.22 - 5.81 Mil/uL 6.27  5.80  6.05   Hemoglobin 13.0 - 17.0 g/dL 19.1 Repeated and verified X2.  17.7  18.6   HCT 39.0 - 52.0 % 56.6 Repeated and verified X2.  53.3  55.1   Platelets  150.0 - 400.0 K/uL 193.0  179.0  194   NEUT# 1.4 - 7.7 K/uL 4.7     Lymph# 0.7 - 4.0 K/uL 0.7        There is no height or weight on file to calculate BMI.  Orders:  No orders of the defined types were placed in this encounter.  No orders of the defined types were placed in this encounter.    Procedures: No procedures performed  Clinical Data: No additional findings.  ROS:  All other systems negative, except as noted in the HPI. Review of Systems  Objective: Vital Signs: There were no vitals taken for this visit.  Specialty Comments:  No specialty comments available.  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/2022    Malaise 06/22/2022   Polycythemia 06/22/2022   Amputation below knee (HCC) 04/23/2022   PTSD (post-traumatic stress disorder) 04/23/2022   Generalized anxiety disorder 01/21/2022   Episode of unresponsiveness 12/15/2021   Acute respiratory failure with hypoxia (HCC) secondary to suspected aspiration pneumonia 12/15/2021   Renal mass 12/15/2021   Stage IV pressure ulcer of sacral region (HCC) 09/15/2021   Insomnia 09/14/2021   Chronic systolic CHF (congestive heart failure) (HCC) 09/14/2021   Hypogonadism in male 09/14/2021   Chronic pain 09/14/2021   Pseudohyponatremia 09/13/2021   Normocytic anemia 09/13/2021   Discitis of thoracic region    Paroxysmal atrial flutter (HCC) 07/22/2021   Chronic diastolic (congestive) heart failure (HCC) 05/16/2021   Lumbar spondylosis 03/25/2021   H/O amputation of leg through tibia and fibula (HCC) 11/10/2019   Degeneration of lumbar intervertebral disc 11/10/2019   Hypercholesterolemia 11/10/2019   S/P bilateral below knee amputation (HCC) 10/30/2019   Dyslipidemia 10/30/2019   Type 1 diabetes mellitus with diabetic polyneuropathy, with long-term current use of insulin (HCC) 10/12/2019   Right below-elbow amputee 10/12/2019   Hypertensive disorder 09/25/2019   Urinary hesitancy 09/25/2019   Prosthesis adjustments 09/25/2019   Erectile dysfunction 09/25/2019   Palliative care status 09/25/2019   Pulmonary nodules 09/25/2019   Depressive disorder 09/25/2019   Past Medical History:  Diagnosis Date   Acquired complex renal cyst 09/25/2019   AKI (acute kidney injury) (HCC) 07/22/2021   Cancer (HCC)    renal   Chest pain 07/22/2021   Complication of anesthesia    Decubitus ulcer of sacral area 12/15/2021   Depression    Diabetes mellitus without complication (HCC)    Diabetic infection of left foot (HCC) 04/17/2021   Episode of unresponsiveness 12/15/2021   Gas gangrene of foot (HCC) 07/22/2021   Healthcare maintenance 09/25/2019    Hematuria 09/25/2019   History of kidney stones    Lactic acidosis 12/15/2021   Leukocytosis 09/13/2021   Osteomyelitis (HCC)    Sacral pressure injury of skin 07/27/2021   Septic shock (HCC) 07/22/2021   Severe sepsis with acute organ dysfunction (HCC) 07/22/2021   Shortness of breath 05/16/2021   Stage 3a chronic kidney disease (HCC) 09/25/2019   Systolic dysfunction    Type 2 diabetes mellitus 10/12/2019    Family History  Problem Relation Age of Onset   Anxiety disorder Mother    Cancer Father    Coronary artery disease Father     Past Surgical History:  Procedure Laterality Date   AMPUTATION Left 07/23/2021   Procedure: LEFT BELOW KNEE AMPUTATION;  Surgeon: Nadara Mustard, MD;  Location: [facility] OR;  Service: Orthopedics;  Laterality: Left;   BELOW KNEE LEG AMPUTATION Right    BUBBLE STUDY  07/29/2021   Procedure: BUBBLE STUDY;  Surgeon: Quintella Reichert, MD;  Location: [facility] ENDOSCOPY;  Service: Cardiovascular;;   CARDIOVERSION N/A 07/29/2021   Procedure: CARDIOVERSION;  Surgeon: Quintella Reichert, MD;  Location: [facility] ENDOSCOPY;  Service: Cardiovascular;  Laterality: N/A;   RADIOLOGY WITH ANESTHESIA N/A 08/05/2021   Procedure: MRI LUMBAR WITH AND WITHOUT; THORASIC SPINE WITH AND WITHOUT WITH ANESTHESIA;  Surgeon: Radiologist, Medication, MD;  Location: [facility] OR;  Service: Radiology;  Laterality: N/A;   RADIOLOGY WITH ANESTHESIA N/A 08/07/2021   Procedure: MRI WITH LUMBER WITH AND WITHOUT CONTRAST,THORACIC WITH AND WITHOUT CONTRAST;  Surgeon: Radiologist, Medication, MD;  Location: [facility] OR;  Service: Radiology;  Laterality: N/A;   RADIOLOGY WITH ANESTHESIA N/A 09/13/2021   Procedure: MRI WITH ANESTHESIA;  Surgeon: Julieanne Cotton, MD;  Location: [facility] OR;  Service: Radiology;  Laterality: N/A;   TEE WITHOUT CARDIOVERSION N/A 07/29/2021   Procedure: TRANSESOPHAGEAL ECHOCARDIOGRAM (TEE);  Surgeon: Quintella Reichert, MD;  Location: [facility] ENDOSCOPY;  Service: Cardiovascular;  Laterality: N/A;   Social  History   Occupational History   Not on file  Tobacco Use   Smoking status: Never   Smokeless tobacco: Current    Types: Chew  Vaping Use   Vaping Use: Never used  Substance and Sexual Activity   Alcohol use: Not Currently   Drug use: Never   Sexual activity: Not Currently

## 2023-03-01 NOTE — Telephone Encounter (Signed)
Pt is requesting for his 6//20/24 OV note be dictated. He needs the notes for [facility]. Thank you!

## 2023-03-01 NOTE — Telephone Encounter (Signed)
Sent mychart message to pt that note is ready. I can also fax note to [facility] [place] [facility].

## 2023-03-18 ENCOUNTER — Ambulatory Visit: Payer: Medicare HMO | Admitting: Orthopedic Surgery

## 2023-03-18 DIAGNOSIS — Z89511 Acquired absence of right leg below knee: Secondary | ICD-10-CM

## 2023-03-18 DIAGNOSIS — Z89512 Acquired absence of left leg below knee: Secondary | ICD-10-CM | POA: Diagnosis not present

## 2023-03-21 ENCOUNTER — Other Ambulatory Visit: Payer: Self-pay | Admitting: Internal Medicine

## 2023-03-21 DIAGNOSIS — E1165 Type 2 diabetes mellitus with hyperglycemia: Secondary | ICD-10-CM

## 2023-03-21 DIAGNOSIS — M62838 Other muscle spasm: Secondary | ICD-10-CM

## 2023-03-22 ENCOUNTER — Ambulatory Visit (INDEPENDENT_AMBULATORY_CARE_PROVIDER_SITE_OTHER): Payer: Medicare HMO

## 2023-03-22 VITALS — Wt 202.0 lb

## 2023-03-22 DIAGNOSIS — Z0000 Encounter for general adult medical examination without abnormal findings: Secondary | ICD-10-CM

## 2023-03-22 NOTE — Patient Instructions (Signed)
[name] , Thank you for taking time to come for your Medicare Wellness Visit. I appreciate your ongoing commitment to your health goals. Please review the following plan we discussed and let me know if I can assist you in the future.   These are the goals we discussed:  Goals      Patient Stated     None at this time      Patient Stated     Waiting on socket to be able  walk         This is a list of the screening recommended for you and due dates:  Health Maintenance  Topic Date Due   Eye exam for diabetics  Never done   Colon Cancer Screening  04/07/2023*   Flu Shot  04/08/2023   Hemoglobin A1C  06/24/2023   DTaP/Tdap/Td vaccine (2 - Td or Tdap) 09/24/2029   Hepatitis C Screening  Completed   HIV Screening  Completed   HPV Vaccine  Aged Out   Complete foot exam   Discontinued   COVID-19 Vaccine  Discontinued   Zoster (Shingles) Vaccine  Discontinued  *Topic was postponed. The date shown is not the original due date.    Advanced directives: Advance directive discussed with you today. Even though you declined this today please call our office should you change your mind and we can give you the proper paperwork for you to fill out.  Conditions/risks identified: to be able to get socket so I can walk   Next appointment: Follow up in one year for your annual wellness visit   Preventive Care 40-64 Years, Male Preventive care refers to lifestyle choices and visits with your health care provider that can promote health and wellness. What does preventive care include? A yearly physical exam. This is also called an annual well check. Dental exams once or twice a year. Routine eye exams. Ask your health care provider how often you should have your eyes checked. Personal lifestyle choices, including: Daily care of your teeth and gums. Regular physical activity. Eating a healthy diet. Avoiding tobacco and drug use. Limiting alcohol use. Practicing safe sex. Taking low-dose  aspirin every day starting at age 7. What happens during an annual well check? The services and screenings done by your health care provider during your annual well check will depend on your age, overall health, lifestyle risk factors, and family history of disease. Counseling  Your health care provider may ask you questions about your: Alcohol use. Tobacco use. Drug use. Emotional well-being. Home and relationship well-being. Sexual activity. Eating habits. Work and work Astronomer. Screening  You may have the following tests or measurements: Height, weight, and BMI. Blood pressure. Lipid and cholesterol levels. These may be checked every 5 years, or more frequently if you are over 81 years old. Skin check. Lung cancer screening. You may have this screening every year starting at age 28 if you have a 30-pack-year history of smoking and currently smoke or have quit within the past 15 years. Fecal occult blood test (FOBT) of the stool. You may have this test every year starting at age 11. Flexible sigmoidoscopy or colonoscopy. You may have a sigmoidoscopy every 5 years or a colonoscopy every 10 years starting at age 76. Prostate cancer screening. Recommendations will vary depending on your family history and other risks. Hepatitis C blood test. Hepatitis B blood test. Sexually transmitted disease (STD) testing. Diabetes screening. This is done by checking your blood sugar (glucose) after you  have not eaten for a while (fasting). You may have this done every 1-3 years. Discuss your test results, treatment options, and if necessary, the need for more tests with your health care provider. Vaccines  Your health care provider may recommend certain vaccines, such as: Influenza vaccine. This is recommended every year. Tetanus, diphtheria, and acellular pertussis (Tdap, Td) vaccine. You may need a Td booster every 10 years. Zoster vaccine. You may need this after age 59. Pneumococcal  13-valent conjugate (PCV13) vaccine. You may need this if you have certain conditions and have not been vaccinated. Pneumococcal polysaccharide (PPSV23) vaccine. You may need one or two doses if you smoke cigarettes or if you have certain conditions. Talk to your health care provider about which screenings and vaccines you need and how often you need them. This information is not intended to replace advice given to you by your health care provider. Make sure you discuss any questions you have with your health care provider. Document Released: 09/20/2015 Document Revised: 05/13/2016 Document Reviewed: 06/25/2015 Elsevier Interactive Patient Education  2017 [employer].  Fall Prevention in the Home Falls can cause injuries. They can happen to people of all ages. There are many things you can do to make your home safe and to help prevent falls. What can I do on the outside of my home? Regularly fix the edges of walkways and driveways and fix any cracks. Remove anything that might make you trip as you walk through a door, such as a raised step or threshold. Trim any bushes or trees on the path to your home. Use bright outdoor lighting. Clear any walking paths of anything that might make someone trip, such as rocks or tools. Regularly check to see if handrails are loose or broken. Make sure that both sides of any steps have handrails. Any raised decks and porches should have guardrails on the edges. Have any leaves, snow, or ice cleared regularly. Use sand or salt on walking paths during winter. Clean up any spills in your garage right away. This includes oil or grease spills. What can I do in the bathroom? Use night lights. Install grab bars by the toilet and in the tub and shower. Do not use towel bars as grab bars. Use non-skid mats or decals in the tub or shower. If you need to sit down in the shower, use a plastic, non-slip stool. Keep the floor dry. Clean up any water that spills on the  floor as soon as it happens. Remove soap buildup in the tub or shower regularly. Attach bath mats securely with double-sided non-slip rug tape. Do not have throw rugs and other things on the floor that can make you trip. What can I do in the bedroom? Use night lights. Make sure that you have a light by your bed that is easy to reach. Do not use any sheets or blankets that are too big for your bed. They should not hang down onto the floor. Have a firm chair that has side arms. You can use this for support while you get dressed. Do not have throw rugs and other things on the floor that can make you trip. What can I do in the kitchen? Clean up any spills right away. Avoid walking on wet floors. Keep items that you use a lot in easy-to-reach places. If you need to reach something above you, use a strong step stool that has a grab bar. Keep electrical cords out of the way. Do not use  floor polish or wax that makes floors slippery. If you must use wax, use non-skid floor wax. Do not have throw rugs and other things on the floor that can make you trip. What can I do with my stairs? Do not leave any items on the stairs. Make sure that there are handrails on both sides of the stairs and use them. Fix handrails that are broken or loose. Make sure that handrails are as long as the stairways. Check any carpeting to make sure that it is firmly attached to the stairs. Fix any carpet that is loose or worn. Avoid having throw rugs at the top or bottom of the stairs. If you do have throw rugs, attach them to the floor with carpet tape. Make sure that you have a light switch at the top of the stairs and the bottom of the stairs. If you do not have them, ask someone to add them for you. What else can I do to help prevent falls? Wear shoes that: Do not have high heels. Have rubber bottoms. Are comfortable and fit you well. Are closed at the toe. Do not wear sandals. If you use a stepladder: Make sure that  it is fully opened. Do not climb a closed stepladder. Make sure that both sides of the stepladder are locked into place. Ask someone to hold it for you, if possible. Clearly mark and make sure that you can see: Any grab bars or handrails. First and last steps. Where the edge of each step is. Use tools that help you move around (mobility aids) if they are needed. These include: Canes. Walkers. Scooters. Crutches. Turn on the lights when you go into a dark area. Replace any light bulbs as soon as they burn out. Set up your furniture so you have a clear path. Avoid moving your furniture around. If any of your floors are uneven, fix them. If there are any pets around you, be aware of where they are. Review your medicines with your doctor. Some medicines can make you feel dizzy. This can increase your chance of falling. Ask your doctor what other things that you can do to help prevent falls. This information is not intended to replace advice given to you by your health care provider. Make sure you discuss any questions you have with your health care provider. Document Released: 06/20/2009 Document Revised: 01/30/2016 Document Reviewed: 09/28/2014 Elsevier Interactive Patient Education  2017 [employer].

## 2023-03-22 NOTE — Progress Notes (Signed)
Subjective:   [name] is a 57 y.o. male who presents for Medicare Annual/Subsequent preventive examination.  Visit Complete: Virtual  I connected with  [name] on 03/22/23 by a audio enabled telemedicine application and verified that I am speaking with the correct person using two identifiers.  Patient Location: Home  Provider Location: Office/Clinic  I discussed the limitations of evaluation and management by telemedicine. The patient expressed understanding and agreed to proceed.   Review of Systems     Cardiac Risk Factors include: advanced age (>80men, >28 women);diabetes mellitus;dyslipidemia;male gender;hypertension     Objective:    Today's Vitals   03/22/23 1452  Weight: 202 lb (91.6 kg)   Body mass index is 25.25 kg/m.     03/22/2023    3:02 PM 07/06/2022   12:56 PM 06/15/2022    2:26 PM 04/06/2022    2:09 PM 12/29/2021    6:00 PM 12/28/2021    8:58 PM 12/16/2021    4:00 AM  Advanced Directives  Does Patient Have a Medical Advance Directive? No Yes Yes No No No No  Type of Advance Directive  Healthcare Power of [employer] Power of Attorney      Would patient like information on creating a medical advance directive? No - Patient declined   No - Patient declined No - Patient declined  No - Patient declined    Current Medications (verified) Outpatient Encounter Medications as of 03/22/2023  Medication Sig   ACCU-CHEK GUIDE test strip USE UP TO SIX TIMES DAILY   aspirin EC 81 MG tablet Take 81 mg by mouth daily. Swallow whole.   baclofen (LIORESAL) 10 MG tablet TAKE 1 TABLET BY MOUTH THREE TIMES DAILY   blood glucose meter kit and supplies KIT Dispense based on patient and insurance preference. Use up to four times daily as directed.   furosemide (LASIX) 20 MG tablet Take 1 tablet (20 mg total) by mouth daily.   insulin NPH-regular Human (NOVOLIN 70/30 RELION) (70-30) 100 UNIT/ML injection Inject 15 Units into the skin 2 (two) times daily  with a meal. (Patient taking differently: Inject 3-12 Units into the skin 2 (two) times daily with a meal. Sliding scale)   lisinopril (ZESTRIL) 10 MG tablet Take 1 tablet (10 mg total) by mouth daily.   Oxycodone HCl 10 MG TABS Take 10 mg by mouth 3 (three) times daily as needed (Pain).   pregabalin (LYRICA) 75 MG capsule Take 75 mg by mouth 2 (two) times daily.   rosuvastatin (CRESTOR) 20 MG tablet Take 1 tablet (20 mg total) by mouth daily.   sildenafil (VIAGRA) 100 MG tablet Take 100 mg by mouth daily as needed.   tamsulosin (FLOMAX) 0.4 MG CAPS capsule Take 0.4 mg by mouth daily.   testosterone cypionate (DEPO-TESTOSTERONE) 200 MG/ML injection Inject 1 mL (200 mg total) into the muscle every 14 (fourteen) days. Significant Risks of heart attack, stroke with continued use. Reducing dose as tolerated highly recommended   No facility-administered encounter medications on file as of 03/22/2023.    Allergies (verified) Bactrim [sulfamethoxazole-trimethoprim], Ciprofloxacin, and Levaquin [levofloxacin]   History: Past Medical History:  Diagnosis Date   Acquired complex renal cyst 09/25/2019   AKI (acute kidney injury) (HCC) 07/22/2021   Cancer (HCC)    renal   Chest pain 07/22/2021   Complication of anesthesia    Decubitus ulcer of sacral area 12/15/2021   Depression    Diabetes mellitus without complication (HCC)    Diabetic infection of left  foot (HCC) 04/17/2021   Episode of unresponsiveness 12/15/2021   Gas gangrene of foot (HCC) 07/22/2021   Healthcare maintenance 09/25/2019   Hematuria 09/25/2019   History of kidney stones    Lactic acidosis 12/15/2021   Leukocytosis 09/13/2021   Osteomyelitis (HCC)    Sacral pressure injury of skin 07/27/2021   Septic shock (HCC) 07/22/2021   Severe sepsis with acute organ dysfunction (HCC) 07/22/2021   Shortness of breath 05/16/2021   Stage 3a chronic kidney disease (HCC) 09/25/2019   Systolic dysfunction    Type 2 diabetes mellitus  10/12/2019   Past Surgical History:  Procedure Laterality Date   AMPUTATION Left 07/23/2021   Procedure: LEFT BELOW KNEE AMPUTATION;  Surgeon: Nadara Mustard, MD;  Location: [facility] OR;  Service: Orthopedics;  Laterality: Left;   BELOW KNEE LEG AMPUTATION Right    BUBBLE STUDY  07/29/2021   Procedure: BUBBLE STUDY;  Surgeon: Quintella Reichert, MD;  Location: [facility] ENDOSCOPY;  Service: Cardiovascular;;   CARDIOVERSION N/A 07/29/2021   Procedure: CARDIOVERSION;  Surgeon: Quintella Reichert, MD;  Location: [facility] ENDOSCOPY;  Service: Cardiovascular;  Laterality: N/A;   RADIOLOGY WITH ANESTHESIA N/A 08/05/2021   Procedure: MRI LUMBAR WITH AND WITHOUT; THORASIC SPINE WITH AND WITHOUT WITH ANESTHESIA;  Surgeon: Radiologist, Medication, MD;  Location: [facility] OR;  Service: Radiology;  Laterality: N/A;   RADIOLOGY WITH ANESTHESIA N/A 08/07/2021   Procedure: MRI WITH LUMBER WITH AND WITHOUT CONTRAST,THORACIC WITH AND WITHOUT CONTRAST;  Surgeon: Radiologist, Medication, MD;  Location: [facility] OR;  Service: Radiology;  Laterality: N/A;   RADIOLOGY WITH ANESTHESIA N/A 09/13/2021   Procedure: MRI WITH ANESTHESIA;  Surgeon: Julieanne Cotton, MD;  Location: [facility] OR;  Service: Radiology;  Laterality: N/A;   TEE WITHOUT CARDIOVERSION N/A 07/29/2021   Procedure: TRANSESOPHAGEAL ECHOCARDIOGRAM (TEE);  Surgeon: Quintella Reichert, MD;  Location: [facility] ENDOSCOPY;  Service: Cardiovascular;  Laterality: N/A;   Family History  Problem Relation Age of Onset   Anxiety disorder Mother    Cancer Father    Coronary artery disease Father    Social History   Socioeconomic History   Marital status: Divorced    Spouse name: Not on file   Number of children: Not on file   Years of education: Not on file   Highest education level: Bachelor's degree (e.g., BA, AB, BS)  Occupational History   Not on file  Tobacco Use   Smoking status: Never   Smokeless tobacco: Current    Types: Chew  Vaping Use   Vaping status: Never Used  Substance and Sexual  Activity   Alcohol use: Not Currently   Drug use: Never   Sexual activity: Not Currently  Other Topics Concern   Not on file  Social History Narrative   Not on file   Social Determinants of Health   Financial Resource Strain: Low Risk  (03/16/2023)   Overall Financial Resource Strain (CARDIA)    Difficulty of Paying Living Expenses: Not very hard  Food Insecurity: Food Insecurity Present (03/16/2023)   Hunger Vital Sign    Worried About Running Out of Food in the Last Year: Sometimes true    Ran Out of Food in the Last Year: Sometimes true  Transportation Needs: No Transportation Needs (03/16/2023)   PRAPARE - Administrator, Civil Service (Medical): No    Lack of Transportation (Non-Medical): No  Physical Activity: Inactive (03/16/2023)   Exercise Vital Sign    Days of Exercise per Week: 0 days    Minutes  of Exercise per Session: 0 min  Stress: Stress Concern Present (03/16/2023)   [employer] of Occupational Health - Occupational Stress Questionnaire    Feeling of Stress : Very much  Social Connections: Socially Isolated (03/16/2023)   Social Connection and Isolation Panel [NHANES]    Frequency of Communication with Friends and Family: Once a week    Frequency of Social Gatherings with Friends and Family: Never    Attends Religious Services: Never    Database administrator or Organizations: No    Attends Engineer, structural: Never    Marital Status: Divorced    Tobacco Counseling Ready to quit: Not Answered Counseling given: Not Answered   Clinical Intake:  Pre-visit preparation completed: Yes  Pain : No/denies pain     BMI - recorded: 25.25 Nutritional Status: BMI 25 -29 Overweight Nutritional Risks: None Diabetes: Yes CBG done?: Yes (125 per pt) CBG resulted in Enter/ Edit results?: No Did pt. bring in CBG monitor from home?: No  How often do you need to have someone help you when you read instructions, pamphlets, or other written  materials from your doctor or pharmacy?: 2 - Rarely  Interpreter Needed?: No  Information entered by :: Lanier Ensign, LPN   Activities of Daily Living    03/16/2023    2:28 PM 06/15/2022    2:28 PM  In your present state of health, do you have any difficulty performing the following activities:  Hearing? 1   Comment at times [facility]   Vision? 0   Difficulty concentrating or making decisions? 1   Comment at time   Walking or climbing stairs? 1   Comment need prothesis   Dressing or bathing? 0   Doing errands, shopping? 1 0  Preparing Food and eating ? N   Using the Toilet? N   In the past six months, have you accidently leaked urine? Y   Do you have problems with loss of bowel control? N   Managing your Medications? N   Managing your Finances? N   Housekeeping or managing your Housekeeping? Y     Patient Care Team: Lula Olszewski, MD as PCP - General (Internal Medicine) Thurmon Fair, MD as PCP - Cardiology (Cardiology) Sheran Luz, MD as Consulting Physician (Physical Medicine and Rehabilitation) Crist Fat, MD as Attending Physician (Urology) Duanne Guess, MD as Consulting Physician (General Surgery) [facility] (Orthopedic Surgery)  Indicate any recent Medical Services you may have received from other than [facility] providers in the past year (date may be approximate).     Assessment:   This is a routine wellness examination for Maude.  Hearing/Vision screen Hearing Screening - Comments:: Slight HOH  Vision Screening - Comments:: Pt encouraged to follow up with up with eye provider   Dietary issues and exercise activities discussed:     Goals Addressed             This Visit's Progress    Patient Stated       Waiting on socket to be able  walk        Depression Screen    03/22/2023    2:58 PM 12/23/2022    1:36 PM 04/23/2022    1:19 PM 04/06/2022    2:07 PM 12/05/2021   10:31 AM 09/25/2019    3:58 PM 09/25/2019    3:15 PM  PHQ 2/9  Scores  PHQ - 2 Score 4 6 3 2 6 6 3   PHQ- 9 Score 14 16 15  4 13 22      Fall Risk    03/16/2023    2:28 PM 12/23/2022    1:35 PM 04/23/2022    1:19 PM 04/06/2022    2:10 PM 12/05/2021   10:31 AM  Fall Risk   Falls in the past year? 0 0 0 0 0  Number falls in past yr:  0 0 0 0  Injury with Fall? 0 0 0 0 0  Risk for fall due to : Impaired mobility;Impaired balance/gait;Impaired vision;Other (Comment) No Fall Risks No Fall Risks Impaired vision;Impaired mobility No Fall Risks  Risk for fall due to: Comment amputation      Follow up Falls prevention discussed Falls evaluation completed Falls evaluation completed Falls prevention discussed Falls evaluation completed    MEDICARE RISK AT HOME:   TIMED UP AND GO:  Was the test performed?  No    Cognitive Function:        03/22/2023    3:03 PM 04/06/2022    2:17 PM  6CIT Screen  What Year? 0 points 0 points  What month? 0 points 0 points  What time? 0 points 0 points  Count back from 20 0 points 0 points  Months in reverse 4 points 4 points  Repeat phrase 4 points 6 points  Total Score 8 points 10 points    Immunizations Immunization History  Administered Date(s) Administered   Influenza,inj,Quad PF,6+ Mos 09/25/2019, 07/28/2021   Tdap 09/25/2019    TDAP status: Up to date  Flu Vaccine status: Due, Education has been provided regarding the importance of this vaccine. Advised may receive this vaccine at local pharmacy or Health Dept. Aware to provide a copy of the vaccination record if obtained from local pharmacy or Health Dept. Verbalized acceptance and understanding.    Covid-19 vaccine status: Declined, Education has been provided regarding the importance of this vaccine but patient still declined. Advised may receive this vaccine at local pharmacy or Health Dept.or vaccine clinic. Aware to provide a copy of the vaccination record if obtained from local pharmacy or Health Dept. Verbalized acceptance and  understanding.  Qualifies for Shingles Vaccine? Yes   Zostavax completed No   Shingrix Completed?: No.    Education has been provided regarding the importance of this vaccine. Patient has been advised to call insurance company to determine out of pocket expense if they have not yet received this vaccine. Advised may also receive vaccine at local pharmacy or Health Dept. Verbalized acceptance and understanding.  Screening Tests Health Maintenance  Topic Date Due   OPHTHALMOLOGY EXAM  Never done   Colonoscopy  04/07/2023 (Originally 01/20/2011)   INFLUENZA VACCINE  04/08/2023   HEMOGLOBIN A1C  06/24/2023   DTaP/Tdap/Td (2 - Td or Tdap) 09/24/2029   Hepatitis C Screening  Completed   HIV Screening  Completed   HPV VACCINES  Aged Out   FOOT EXAM  Discontinued   COVID-19 Vaccine  Discontinued   Zoster Vaccines- Shingrix  Discontinued    Health Maintenance  Health Maintenance Due  Topic Date Due   OPHTHALMOLOGY EXAM  Never done    Colorectal cancer screening: No longer required.    Additional Screening:  Hepatitis C Screening: Completed 07/24/21  Vision Screening: Recommended annual ophthalmology exams for early detection of glaucoma and other disorders of the eye. Is the patient up to date with their annual eye exam?  No  Who is the provider or what is the name of the office in which the patient attends annual  eye exams? Encouraged to follow up with provider  If pt is not established with a provider, would they like to be referred to a provider to establish care? No .   Dental Screening: Recommended annual dental exams for proper oral hygiene  Diabetic Foot Exam: discontinued   Community Resource Referral / Chronic Care Management: CRR required this visit?  No   CCM required this visit?  No     Plan:     I have personally reviewed and noted the following in the patient's chart:   Medical and social history Use of alcohol, tobacco or illicit drugs  Current  medications and supplements including opioid prescriptions. Patient is currently taking opioid prescriptions. Information provided to patient regarding non-opioid alternatives. Patient advised to discuss non-opioid treatment plan with their provider. Functional ability and status Nutritional status Physical activity Advanced directives List of other physicians Hospitalizations, surgeries, and ER visits in previous 12 months Vitals Screenings to include cognitive, depression, and falls Referrals and appointments  In addition, I have reviewed and discussed with patient certain preventive protocols, quality metrics, and best practice recommendations. A written personalized care plan for preventive services as well as general preventive health recommendations were provided to patient.     Marzella Schlein, LPN   02/09/5408   After Visit Summary: (MyChart) Due to this being a telephonic visit, the after visit summary with patients personalized plan was offered to patient via MyChart   Nurse Notes: none

## 2023-03-26 ENCOUNTER — Encounter: Payer: Self-pay | Admitting: Orthopedic Surgery

## 2023-03-26 NOTE — Progress Notes (Signed)
Office Visit Note   Patient: [name]           Date of Birth: 1965/10/19           MRN: [id number] Visit Date: 03/18/2023              Requested by: Lula Olszewski, MD [address] [place],  [place] [address] PCP: Lula Olszewski, MD  Chief Complaint  Patient presents with   Right Leg - Follow-up      HPI: Presents with concern for discoloration left BKA  Assessment & Plan: Visit Diagnoses:  1. S/P bilateral below knee amputation (HCC)     Plan: heat rash under liner, gave him a prosthetic underliner  Follow-Up Instructions: No follow-ups on file.   Ortho Exam  Patient is alert, oriented, no adenopathy, well-dressed, normal affect, normal respiratory effort. Rash under the line from sweating in the heat  Imaging: No results found. No images are attached to the encounter.  Labs: Lab Results  Component Value Date   HGBA1C 7.1 (H) 12/23/2022   HGBA1C 11.9 (H) 06/15/2022   HGBA1C 8.6 (A) 12/05/2021   HGBA1C 8.6 12/05/2021   HGBA1C 8.6 (A) 12/05/2021   HGBA1C 8.6 (A) 12/05/2021   ESRSEDRATE 25 (H) 07/03/2022   ESRSEDRATE 11 12/30/2021   ESRSEDRATE 12 12/29/2021   CRP <1.0 07/03/2022   CRP 0.7 12/30/2021   CRP 1.1 (H) 12/29/2021   REPTSTATUS 04/11/2022 FINAL 04/09/2022   CULT >=100,000 COLONIES/mL PSEUDOMONAS AERUGINOSA (A) 04/09/2022   LABORGA PSEUDOMONAS AERUGINOSA (A) 04/09/2022     Lab Results  Component Value Date   ALBUMIN 4.2 12/23/2022   ALBUMIN 4.0 07/03/2022   ALBUMIN 3.2 (L) 12/28/2021    Lab Results  Component Value Date   MG 1.7 12/18/2021   MG 1.4 (L) 12/16/2021   MG 1.5 (L) 08/15/2021   No results found for: "VD25OH"  No results found for: "PREALBUMIN"    Latest Ref Rng & Units 12/23/2022    2:23 PM 07/03/2022    1:38 PM 06/15/2022    2:32 PM  CBC EXTENDED  WBC 4.0 - 10.5 K/uL 6.2  4.6  5.4   RBC 4.22 - 5.81 Mil/uL 6.27  5.80  6.05   Hemoglobin 13.0 - 17.0 g/dL 19.1 Repeated and verified X2.  17.7  18.6   HCT  39.0 - 52.0 % 56.6 Repeated and verified X2.  53.3  55.1   Platelets 150.0 - 400.0 K/uL 193.0  179.0  194   NEUT# 1.4 - 7.7 K/uL 4.7     Lymph# 0.7 - 4.0 K/uL 0.7        There is no height or weight on file to calculate BMI.  Orders:  No orders of the defined types were placed in this encounter.  No orders of the defined types were placed in this encounter.    Procedures: No procedures performed  Clinical Data: No additional findings.  ROS:  All other systems negative, except as noted in the HPI. Review of Systems  Objective: Vital Signs: There were no vitals taken for this visit.  Specialty Comments:  No specialty comments available.  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/2022   Malaise 06/22/2022   Polycythemia 06/22/2022   Amputation below knee (HCC) 04/23/2022   PTSD (post-traumatic stress disorder) 04/23/2022   Generalized anxiety disorder 01/21/2022   Episode of unresponsiveness 12/15/2021  Acute respiratory failure with hypoxia (HCC) secondary to suspected aspiration pneumonia 12/15/2021   Renal mass 12/15/2021   Stage IV pressure ulcer of sacral region (HCC) 09/15/2021   Insomnia 09/14/2021   Chronic systolic CHF (congestive heart failure) (HCC) 09/14/2021   Hypogonadism in male 09/14/2021   Chronic pain 09/14/2021   Pseudohyponatremia 09/13/2021   Normocytic anemia 09/13/2021   Discitis of thoracic region    Paroxysmal atrial flutter (HCC) 07/22/2021   Chronic diastolic (congestive) heart failure (HCC) 05/16/2021   Lumbar spondylosis 03/25/2021   H/O amputation of leg through tibia and fibula (HCC) 11/10/2019   Degeneration of lumbar intervertebral disc 11/10/2019   Hypercholesterolemia 11/10/2019   S/P bilateral below knee amputation (HCC) 10/30/2019   Dyslipidemia 10/30/2019   Type 1 diabetes mellitus with diabetic polyneuropathy, with long-term  current use of insulin (HCC) 10/12/2019   Right below-elbow amputee 10/12/2019   Hypertensive disorder 09/25/2019   Urinary hesitancy 09/25/2019   Prosthesis adjustments 09/25/2019   Erectile dysfunction 09/25/2019   Palliative care status 09/25/2019   Pulmonary nodules 09/25/2019   Depressive disorder 09/25/2019   Past Medical History:  Diagnosis Date   Acquired complex renal cyst 09/25/2019   AKI (acute kidney injury) (HCC) 07/22/2021   Cancer (HCC)    renal   Chest pain 07/22/2021   Complication of anesthesia    Decubitus ulcer of sacral area 12/15/2021   Depression    Diabetes mellitus without complication (HCC)    Diabetic infection of left foot (HCC) 04/17/2021   Episode of unresponsiveness 12/15/2021   Gas gangrene of foot (HCC) 07/22/2021   Healthcare maintenance 09/25/2019   Hematuria 09/25/2019   History of kidney stones    Lactic acidosis 12/15/2021   Leukocytosis 09/13/2021   Osteomyelitis (HCC)    Sacral pressure injury of skin 07/27/2021   Septic shock (HCC) 07/22/2021   Severe sepsis with acute organ dysfunction (HCC) 07/22/2021   Shortness of breath 05/16/2021   Stage 3a chronic kidney disease (HCC) 09/25/2019   Systolic dysfunction    Type 2 diabetes mellitus 10/12/2019    Family History  Problem Relation Age of Onset   Anxiety disorder Mother    Cancer Father    Coronary artery disease Father     Past Surgical History:  Procedure Laterality Date   AMPUTATION Left 07/23/2021   Procedure: LEFT BELOW KNEE AMPUTATION;  Surgeon: Nadara Mustard, MD;  Location: [facility] OR;  Service: Orthopedics;  Laterality: Left;   BELOW KNEE LEG AMPUTATION Right    BUBBLE STUDY  07/29/2021   Procedure: BUBBLE STUDY;  Surgeon: Quintella Reichert, MD;  Location: [facility] ENDOSCOPY;  Service: Cardiovascular;;   CARDIOVERSION N/A 07/29/2021   Procedure: CARDIOVERSION;  Surgeon: Quintella Reichert, MD;  Location: [facility] ENDOSCOPY;  Service: Cardiovascular;  Laterality: N/A;   RADIOLOGY WITH  ANESTHESIA N/A 08/05/2021   Procedure: MRI LUMBAR WITH AND WITHOUT; THORASIC SPINE WITH AND WITHOUT WITH ANESTHESIA;  Surgeon: Radiologist, Medication, MD;  Location: [facility] OR;  Service: Radiology;  Laterality: N/A;   RADIOLOGY WITH ANESTHESIA N/A 08/07/2021   Procedure: MRI WITH LUMBER WITH AND WITHOUT CONTRAST,THORACIC WITH AND WITHOUT CONTRAST;  Surgeon: Radiologist, Medication, MD;  Location: [facility] OR;  Service: Radiology;  Laterality: N/A;   RADIOLOGY WITH ANESTHESIA N/A 09/13/2021   Procedure: MRI WITH ANESTHESIA;  Surgeon: Julieanne Cotton, MD;  Location: [facility] OR;  Service: Radiology;  Laterality: N/A;   TEE WITHOUT CARDIOVERSION N/A 07/29/2021   Procedure: TRANSESOPHAGEAL ECHOCARDIOGRAM (TEE);  Surgeon: Quintella Reichert, MD;  Location:  [facility] ENDOSCOPY;  Service: Cardiovascular;  Laterality: N/A;   Social History   Occupational History   Not on file  Tobacco Use   Smoking status: Never   Smokeless tobacco: Current    Types: Chew  Vaping Use   Vaping status: Never Used  Substance and Sexual Activity   Alcohol use: Not Currently   Drug use: Never   Sexual activity: Not Currently

## 2023-04-20 ENCOUNTER — Other Ambulatory Visit: Payer: Self-pay | Admitting: Physician Assistant

## 2023-04-20 DIAGNOSIS — T8789 Other complications of amputation stump: Secondary | ICD-10-CM

## 2023-04-20 NOTE — Telephone Encounter (Signed)
Patient of Dr. Croitoru. Please review for refill. Thank you!  

## 2023-04-26 ENCOUNTER — Other Ambulatory Visit: Payer: Self-pay | Admitting: Internal Medicine

## 2023-04-26 DIAGNOSIS — Z794 Long term (current) use of insulin: Secondary | ICD-10-CM

## 2023-04-30 ENCOUNTER — Ambulatory Visit: Payer: Medicare HMO | Admitting: Family

## 2023-04-30 DIAGNOSIS — Z89512 Acquired absence of left leg below knee: Secondary | ICD-10-CM | POA: Diagnosis not present

## 2023-04-30 DIAGNOSIS — M79605 Pain in left leg: Secondary | ICD-10-CM

## 2023-04-30 DIAGNOSIS — Z89511 Acquired absence of right leg below knee: Secondary | ICD-10-CM | POA: Diagnosis not present

## 2023-05-06 DIAGNOSIS — E1101 Type 2 diabetes mellitus with hyperosmolarity with coma: Secondary | ICD-10-CM | POA: Diagnosis not present

## 2023-05-06 DIAGNOSIS — Z79899 Other long term (current) drug therapy: Secondary | ICD-10-CM | POA: Diagnosis not present

## 2023-05-06 DIAGNOSIS — Z5181 Encounter for therapeutic drug level monitoring: Secondary | ICD-10-CM | POA: Diagnosis not present

## 2023-05-06 DIAGNOSIS — M47816 Spondylosis without myelopathy or radiculopathy, lumbar region: Secondary | ICD-10-CM | POA: Diagnosis not present

## 2023-05-06 DIAGNOSIS — M5136 Other intervertebral disc degeneration, lumbar region: Secondary | ICD-10-CM | POA: Diagnosis not present

## 2023-05-06 DIAGNOSIS — G546 Phantom limb syndrome with pain: Secondary | ICD-10-CM | POA: Diagnosis not present

## 2023-05-07 ENCOUNTER — Encounter: Payer: Self-pay | Admitting: Family

## 2023-05-07 NOTE — Progress Notes (Signed)
Office Visit Note   Patient: [name]           Date of Birth: Jan 05, 1966           MRN: [id number] Visit Date: 04/30/2023              Requested by: Lula Olszewski, MD [address] [place],  [place] [address] PCP: Lula Olszewski, MD  Chief Complaint  Patient presents with   Right Leg - Wound Check    Hx right BKA      HPI: The patient is a 57 year old gentleman who presents today with significant concerns for shooting burning aching throbbing electrical pain to the left residual limb he is status post remote amputation on the left he did well for several years with his prosthesis but unfortunately has developed pain he has subsequently had several modifications to sockets and new sockets fabricated and continues to have significant discomfort cannot tolerate wearing the socket even without weightbearing  Assessment & Plan: Visit Diagnoses: No diagnosis found.  Plan: Will refer to Dr. Shon Baton for injection.  Rule out neuroma on the left  Follow-Up Instructions: No follow-ups on file.   Ortho Exam  Patient is alert, oriented, no adenopathy, well-dressed, normal affect, normal respiratory effort. Most tender area to palpation to the distal fibula and lateral aspect of his left residual limb.  There is no erythema no palpable mass no fluctuance no warmth palpation of the distal fibula reproduces his pain  Imaging: No results found. No images are attached to the encounter.  Labs: Lab Results  Component Value Date   HGBA1C 7.1 (H) 12/23/2022   HGBA1C 11.9 (H) 06/15/2022   HGBA1C 8.6 (A) 12/05/2021   HGBA1C 8.6 12/05/2021   HGBA1C 8.6 (A) 12/05/2021   HGBA1C 8.6 (A) 12/05/2021   ESRSEDRATE 25 (H) 07/03/2022   ESRSEDRATE 11 12/30/2021   ESRSEDRATE 12 12/29/2021   CRP <1.0 07/03/2022   CRP 0.7 12/30/2021   CRP 1.1 (H) 12/29/2021   REPTSTATUS 04/11/2022 FINAL 04/09/2022   CULT >=100,000 COLONIES/mL PSEUDOMONAS AERUGINOSA (A) 04/09/2022   LABORGA PSEUDOMONAS  AERUGINOSA (A) 04/09/2022     Lab Results  Component Value Date   ALBUMIN 4.2 12/23/2022   ALBUMIN 4.0 07/03/2022   ALBUMIN 3.2 (L) 12/28/2021    Lab Results  Component Value Date   MG 1.7 12/18/2021   MG 1.4 (L) 12/16/2021   MG 1.5 (L) 08/15/2021   No results found for: "VD25OH"  No results found for: "PREALBUMIN"    Latest Ref Rng & Units 12/23/2022    2:23 PM 07/03/2022    1:38 PM 06/15/2022    2:32 PM  CBC EXTENDED  WBC 4.0 - 10.5 K/uL 6.2  4.6  5.4   RBC 4.22 - 5.81 Mil/uL 6.27  5.80  6.05   Hemoglobin 13.0 - 17.0 g/dL 40.3 Repeated and verified X2.  17.7  18.6   HCT 39.0 - 52.0 % 56.6 Repeated and verified X2.  53.3  55.1   Platelets 150.0 - 400.0 K/uL 193.0  179.0  194   NEUT# 1.4 - 7.7 K/uL 4.7     Lymph# 0.7 - 4.0 K/uL 0.7        There is no height or weight on file to calculate BMI.  Orders:  No orders of the defined types were placed in this encounter.  No orders of the defined types were placed in this encounter.    Procedures: No procedures performed  Clinical Data: No additional  findings.  ROS:  All other systems negative, except as noted in the HPI. Review of Systems  Objective: Vital Signs: There were no vitals taken for this visit.  Specialty Comments:  No specialty comments available.  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/2022   Malaise 06/22/2022   Polycythemia 06/22/2022   Amputation below knee (HCC) 04/23/2022   PTSD (post-traumatic stress disorder) 04/23/2022   Generalized anxiety disorder 01/21/2022   Episode of unresponsiveness 12/15/2021   Acute respiratory failure with hypoxia (HCC) secondary to suspected aspiration pneumonia 12/15/2021   Renal mass 12/15/2021   Stage IV pressure ulcer of sacral region (HCC) 09/15/2021   Insomnia 09/14/2021   Chronic systolic CHF (congestive heart failure) (HCC) 09/14/2021    Hypogonadism in male 09/14/2021   Chronic pain 09/14/2021   Pseudohyponatremia 09/13/2021   Normocytic anemia 09/13/2021   Discitis of thoracic region    Paroxysmal atrial flutter (HCC) 07/22/2021   Chronic diastolic (congestive) heart failure (HCC) 05/16/2021   Lumbar spondylosis 03/25/2021   H/O amputation of leg through tibia and fibula (HCC) 11/10/2019   Degeneration of lumbar intervertebral disc 11/10/2019   Hypercholesterolemia 11/10/2019   S/P bilateral below knee amputation (HCC) 10/30/2019   Dyslipidemia 10/30/2019   Type 1 diabetes mellitus with diabetic polyneuropathy, with long-term current use of insulin (HCC) 10/12/2019   Right below-elbow amputee 10/12/2019   Hypertensive disorder 09/25/2019   Urinary hesitancy 09/25/2019   Prosthesis adjustments 09/25/2019   Erectile dysfunction 09/25/2019   Palliative care status 09/25/2019   Pulmonary nodules 09/25/2019   Depressive disorder 09/25/2019   Past Medical History:  Diagnosis Date   Acquired complex renal cyst 09/25/2019   AKI (acute kidney injury) (HCC) 07/22/2021   Cancer (HCC)    renal   Chest pain 07/22/2021   Complication of anesthesia    Decubitus ulcer of sacral area 12/15/2021   Depression    Diabetes mellitus without complication (HCC)    Diabetic infection of left foot (HCC) 04/17/2021   Episode of unresponsiveness 12/15/2021   Gas gangrene of foot (HCC) 07/22/2021   Healthcare maintenance 09/25/2019   Hematuria 09/25/2019   History of kidney stones    Lactic acidosis 12/15/2021   Leukocytosis 09/13/2021   Osteomyelitis (HCC)    Sacral pressure injury of skin 07/27/2021   Septic shock (HCC) 07/22/2021   Severe sepsis with acute organ dysfunction (HCC) 07/22/2021   Shortness of breath 05/16/2021   Stage 3a chronic kidney disease (HCC) 09/25/2019   Systolic dysfunction    Type 2 diabetes mellitus 10/12/2019    Family History  Problem Relation Age of Onset   Anxiety disorder Mother    Cancer  Father    Coronary artery disease Father     Past Surgical History:  Procedure Laterality Date   AMPUTATION Left 07/23/2021   Procedure: LEFT BELOW KNEE AMPUTATION;  Surgeon: Nadara Mustard, MD;  Location: [facility] OR;  Service: Orthopedics;  Laterality: Left;   BELOW KNEE LEG AMPUTATION Right    BUBBLE STUDY  07/29/2021   Procedure: BUBBLE STUDY;  Surgeon: Quintella Reichert, MD;  Location: [facility] ENDOSCOPY;  Service: Cardiovascular;;   CARDIOVERSION N/A 07/29/2021   Procedure: CARDIOVERSION;  Surgeon: Quintella Reichert, MD;  Location: [facility] ENDOSCOPY;  Service: Cardiovascular;  Laterality: N/A;   RADIOLOGY WITH ANESTHESIA N/A 08/05/2021   Procedure: MRI LUMBAR WITH AND WITHOUT; THORASIC SPINE WITH AND WITHOUT WITH ANESTHESIA;  Surgeon:  Radiologist, Medication, MD;  Location: [facility] OR;  Service: Radiology;  Laterality: N/A;   RADIOLOGY WITH ANESTHESIA N/A 08/07/2021   Procedure: MRI WITH LUMBER WITH AND WITHOUT CONTRAST,THORACIC WITH AND WITHOUT CONTRAST;  Surgeon: Radiologist, Medication, MD;  Location: [facility] OR;  Service: Radiology;  Laterality: N/A;   RADIOLOGY WITH ANESTHESIA N/A 09/13/2021   Procedure: MRI WITH ANESTHESIA;  Surgeon: Julieanne Cotton, MD;  Location: [facility] OR;  Service: Radiology;  Laterality: N/A;   TEE WITHOUT CARDIOVERSION N/A 07/29/2021   Procedure: TRANSESOPHAGEAL ECHOCARDIOGRAM (TEE);  Surgeon: Quintella Reichert, MD;  Location: [facility] ENDOSCOPY;  Service: Cardiovascular;  Laterality: N/A;   Social History   Occupational History   Not on file  Tobacco Use   Smoking status: Never   Smokeless tobacco: Current    Types: Chew  Vaping Use   Vaping status: Never Used  Substance and Sexual Activity   Alcohol use: Not Currently   Drug use: Never   Sexual activity: Not Currently

## 2023-05-11 ENCOUNTER — Other Ambulatory Visit: Payer: Self-pay

## 2023-05-11 ENCOUNTER — Ambulatory Visit (INDEPENDENT_AMBULATORY_CARE_PROVIDER_SITE_OTHER): Payer: Medicare HMO | Admitting: Sports Medicine

## 2023-05-11 ENCOUNTER — Encounter: Payer: Self-pay | Admitting: Sports Medicine

## 2023-05-11 DIAGNOSIS — M79661 Pain in right lower leg: Secondary | ICD-10-CM

## 2023-05-11 DIAGNOSIS — G5731 Lesion of lateral popliteal nerve, right lower limb: Secondary | ICD-10-CM | POA: Diagnosis not present

## 2023-05-11 DIAGNOSIS — Z89512 Acquired absence of left leg below knee: Secondary | ICD-10-CM | POA: Diagnosis not present

## 2023-05-11 DIAGNOSIS — Z89511 Acquired absence of right leg below knee: Secondary | ICD-10-CM

## 2023-05-11 NOTE — Progress Notes (Signed)
[name] - 57 y.o. male MRN [id number]  Date of birth: 07-08-1966  Office Visit Note: Visit Date: 05/11/2023 PCP: Lula Olszewski, MD Referred by: Lula Olszewski, MD  Subjective: Chief Complaint  Patient presents with   Right Leg Pain    Patient has been having trouble with prosthetics since April 2024. He states that he needed a new prosthetic but has not been able to wear it in 3 weeks for no more than 20 minutes due to pain. Patient states that stump has been consistently red, inflamed, and painful.   HPI: [name] is a pleasant 57 y.o. male who presents today for acute on chronic R-BKA leg pain.  [name] has had issues with his prosthetics since April of this year.  He had an issue with both of his sockets, was seen at the [facility] clinic for this but he did not think they took his concerns seriously and did not have a good fit.  He has been seeing my partner Dr. Lajoyce Corners as well as Barnie Del here in attempt to have this corrected.  He was able to get new sockets about 3 weeks ago and the left leg is doing great but his right distal stump continues to be painful as well as red and he has difficulty wearing his new prosthetic/socket because of the pain.  Pertinent ROS were reviewed with the patient and found to be negative unless otherwise specified above in HPI.   Assessment & Plan: Visit Diagnoses:  1. Peroneal neuritis, right   2. S/P bilateral below knee amputation (HCC)   3. Pain in right lower leg    Plan: Javid has been dealing with rather bothersome pain of his right leg near the stump s/p his prior BKA. Had done well for years until issues with prosthetic socket - just recently received new ones but pain has been keeping him from wearing it. Through shared decision making did proceed with US-guided peroneal nerve hydrodissection/lysis for both diagnostic and therapeutic purposes in ruling in/out peroneal neuritis. Advised ice, tylenol, activity modification for next  48-72 hours. Will f/u with Dr. Lajoyce Corners or Yetta Numbers in next 2-3 weeks to evaluate response and hopeful improvement. Based on US findings, if not significantly helpful (even temporarily), could consider MRI of tib/fibula to rule out stress-fracture or other bony pathology. May continue his chronic Oxycodone as needed.  Follow-up: Return in about 2 weeks (around 05/25/2023) for with Dr. Lajoyce Corners for R-leg (BKA) in 2-3 weeks .   Meds & Orders: No orders of the defined types were placed in this encounter.   Orders Placed This Encounter  Procedures   [place] Extrem Low Right Ltd     Procedures: US-guided peroneal nerve hydrodissection (neurolysis), right knee: After discussion on risks/benefits/indications, informed verbal consent was obtained. A timeout was then performed. Patient was placed in lateral recumbent position on the table in exam room with bolster between the knees.  The right lateral knee was prepped with ChloraPrep and multiple alcohol swabs.  Utilizing ultrasound guidance, the fibular head (stump) was identified as well as the common peroneal nerve.  The overlying soft tissue was first anesthetized with 2 cc of lidocaine 1%.  Following this, using ultrasound guidance via an in-plane approach, a 25-gauge, 1.5 inch needle was inserted from a posterior to anterior direction around the peroneal nerve.  Using a mixture of 3:1:1:1 cc's of D5W:lidocaine 1%:bupivicaine:celestone 6mg /mL, the peroneal nerve was hydrodissected.  The needle was inserted both above and below the nerve  with ultrasound visualization of 360 degrees spread and appropriate hydrodissection and lysis of the peroneal nerve.  Patient tolerated the procedure well without immediate complication.  Band-Aid was applied.      Clinical History: No specialty comments available.  He reports that he has never smoked. His smokeless tobacco use includes chew.  Recent Labs    06/15/22 1432 12/23/22 1423  HGBA1C 11.9* 7.1*    Objective:     Physical Exam  Gen: Well-appearing, in no acute distress; non-toxic CV: Well-perfused. Warm.  Resp: Breathing unlabored on room air; no wheezing. Psych: Fluid speech in conversation; appropriate affect; normal thought process  Ortho Exam - Right leg: + TTP over distal end of proximal fibular stump -- pain with deep palpation and mildly + Tinel's in this region. No overlying soft tissue fluctuance or redness. No signs of infection around prior BKA.   Imaging: No results found.  Past Medical/Family/Surgical/Social History: Medications & Allergies reviewed per EMR, new medications updated. Patient Active Problem List   Diagnosis Date Noted   Chronic back pain 12/24/2022   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HCC) 07/24/2022   Malaise 06/22/2022   Polycythemia 06/22/2022   Amputation below knee (HCC) 04/23/2022   PTSD (post-traumatic stress disorder) 04/23/2022   Generalized anxiety disorder 01/21/2022   Episode of unresponsiveness 12/15/2021   Acute respiratory failure with hypoxia (HCC) secondary to suspected aspiration pneumonia 12/15/2021   Renal mass 12/15/2021   Stage IV pressure ulcer of sacral region (HCC) 09/15/2021   Insomnia 09/14/2021   Chronic systolic CHF (congestive heart failure) (HCC) 09/14/2021   Hypogonadism in male 09/14/2021   Chronic pain 09/14/2021   Pseudohyponatremia 09/13/2021   Normocytic anemia 09/13/2021   Discitis of thoracic region    Paroxysmal atrial flutter (HCC) 07/22/2021   Chronic diastolic (congestive) heart failure (HCC) 05/16/2021   Lumbar spondylosis 03/25/2021   H/O amputation of leg through tibia and fibula (HCC) 11/10/2019   Degeneration of lumbar intervertebral disc 11/10/2019   Hypercholesterolemia 11/10/2019   S/P bilateral below knee amputation (HCC) 10/30/2019   Dyslipidemia 10/30/2019   Type 1 diabetes mellitus with diabetic polyneuropathy, with long-term current use of insulin (HCC) 10/12/2019   Right below-elbow amputee 10/12/2019    Hypertensive disorder 09/25/2019   Urinary hesitancy 09/25/2019   Prosthesis adjustments 09/25/2019   Erectile dysfunction 09/25/2019   Palliative care status 09/25/2019   Pulmonary nodules 09/25/2019   Depressive disorder 09/25/2019   Past Medical History:  Diagnosis Date   Acquired complex renal cyst 09/25/2019   AKI (acute kidney injury) (HCC) 07/22/2021   Cancer (HCC)    renal   Chest pain 07/22/2021   Complication of anesthesia    Decubitus ulcer of sacral area 12/15/2021   Depression    Diabetes mellitus without complication (HCC)    Diabetic infection of left foot (HCC) 04/17/2021   Episode of unresponsiveness 12/15/2021   Gas gangrene of foot (HCC) 07/22/2021   Healthcare maintenance 09/25/2019   Hematuria 09/25/2019   History of kidney stones    Lactic acidosis 12/15/2021   Leukocytosis 09/13/2021   Osteomyelitis (HCC)    Sacral pressure injury of skin 07/27/2021   Septic shock (HCC) 07/22/2021   Severe sepsis with acute organ dysfunction (HCC) 07/22/2021   Shortness of breath 05/16/2021   Stage 3a chronic kidney disease (HCC) 09/25/2019   Systolic dysfunction    Type 2 diabetes mellitus 10/12/2019   Family History  Problem Relation Age of Onset   Anxiety disorder Mother    Cancer Father  Coronary artery disease Father    Past Surgical History:  Procedure Laterality Date   AMPUTATION Left 07/23/2021   Procedure: LEFT BELOW KNEE AMPUTATION;  Surgeon: Nadara Mustard, MD;  Location: [facility] OR;  Service: Orthopedics;  Laterality: Left;   BELOW KNEE LEG AMPUTATION Right    BUBBLE STUDY  07/29/2021   Procedure: BUBBLE STUDY;  Surgeon: Quintella Reichert, MD;  Location: [facility] ENDOSCOPY;  Service: Cardiovascular;;   CARDIOVERSION N/A 07/29/2021   Procedure: CARDIOVERSION;  Surgeon: Quintella Reichert, MD;  Location: [facility] ENDOSCOPY;  Service: Cardiovascular;  Laterality: N/A;   RADIOLOGY WITH ANESTHESIA N/A 08/05/2021   Procedure: MRI LUMBAR WITH AND WITHOUT; THORASIC SPINE  WITH AND WITHOUT WITH ANESTHESIA;  Surgeon: Radiologist, Medication, MD;  Location: [facility] OR;  Service: Radiology;  Laterality: N/A;   RADIOLOGY WITH ANESTHESIA N/A 08/07/2021   Procedure: MRI WITH LUMBER WITH AND WITHOUT CONTRAST,THORACIC WITH AND WITHOUT CONTRAST;  Surgeon: Radiologist, Medication, MD;  Location: [facility] OR;  Service: Radiology;  Laterality: N/A;   RADIOLOGY WITH ANESTHESIA N/A 09/13/2021   Procedure: MRI WITH ANESTHESIA;  Surgeon: Julieanne Cotton, MD;  Location: [facility] OR;  Service: Radiology;  Laterality: N/A;   TEE WITHOUT CARDIOVERSION N/A 07/29/2021   Procedure: TRANSESOPHAGEAL ECHOCARDIOGRAM (TEE);  Surgeon: Quintella Reichert, MD;  Location: [facility] ENDOSCOPY;  Service: Cardiovascular;  Laterality: N/A;   Social History   Occupational History   Not on file  Tobacco Use   Smoking status: Never   Smokeless tobacco: Current    Types: Chew  Vaping Use   Vaping status: Never Used  Substance and Sexual Activity   Alcohol use: Not Currently   Drug use: Never   Sexual activity: Not Currently

## 2023-05-12 ENCOUNTER — Telehealth: Payer: Self-pay | Admitting: Internal Medicine

## 2023-05-12 NOTE — Telephone Encounter (Signed)
Patient requests a new Order/RX for a Economist.  Does not want anything more to do with [employer] or affiliates.  Also, Patient has been having trouble with his leg prosthetics -does want to deal with [employer].  Patient states he will be needing a battery scooter. States he is met his out-of-pocket for 2024.  Patient states Dr. Lajoyce Corners and Dr. Debbe Bales should be sending Dr. Jon Billings records/information.  Patient requests to be called to be advised.

## 2023-05-17 NOTE — Telephone Encounter (Signed)
Patient called back for an update on if PCP had gotten the orders for equipment listed below. Patient reiterated that he doesn't want ANYTHING to do with [employer].

## 2023-05-18 ENCOUNTER — Other Ambulatory Visit: Payer: Self-pay

## 2023-05-18 DIAGNOSIS — Z89511 Acquired absence of right leg below knee: Secondary | ICD-10-CM

## 2023-05-19 NOTE — Telephone Encounter (Signed)
Faxed order for a wheelchair and battery for scooter to [facility] medical supply. Sent my chart message informing patient.

## 2023-05-26 ENCOUNTER — Ambulatory Visit (INDEPENDENT_AMBULATORY_CARE_PROVIDER_SITE_OTHER): Payer: Medicare HMO | Admitting: Family

## 2023-05-26 ENCOUNTER — Encounter: Payer: Self-pay | Admitting: Family

## 2023-05-26 DIAGNOSIS — M79661 Pain in right lower leg: Secondary | ICD-10-CM | POA: Diagnosis not present

## 2023-05-26 NOTE — Progress Notes (Signed)
Office Visit Note   Patient: [name]           Date of Birth: 1966-03-22           MRN: [id number] Visit Date: 05/26/2023              Requested by: Lula Olszewski, MD [address] [place],  [place] [address] PCP: Lula Olszewski, MD  Chief Complaint  Patient presents with   Right Leg - Follow-up    Hx right BKA       HPI: Patient is a 57 year old gentleman who presents today in follow-up for his acute on chronic right residual limb pain.  He has had issues specifically since April of this year when he was fitted for a new socket.  He has had modifications and difficulty getting his prosthetic to fit when he lays down so he immediately has pain and specifically laterally to the distal fibula feels this increases his swelling as well.  He has had difficulty with pain control for this shooting burning electrical type pain  Most recently has been seen by Dr. Shon Baton for ultrasound-guided peroneal nerve hydrodissection/lysis.  When asked if this was effective the patient reports he is unable to say as he has not had the pain since the injection he has not donned his prosthesis.  He reports attempting to don the socket reproduces his symptoms.  He plans to work with bionic for his next prosthesis  Assessment & Plan: Visit Diagnoses:  1. Pain in right lower leg     Plan: As treatment was not definitive.  We will proceed with MRI of tib/fibula to rule out stress fracture or other bony pathology.  He will follow-up with Dr. Lajoyce Corners for review.  Follow-Up Instructions: Return mri review c duda.   Ortho Exam  Patient is alert, oriented, no adenopathy, well-dressed, normal affect, normal respiratory effort. On examination of the right residual limb this is well consolidated well-healed.  He is tender to palpation over the distal end of the fibular he has pain with deep palpation.  No overlying soft tissue fluctuance no redness no warmth  Imaging: No results found. No images  are attached to the encounter.  Labs: Lab Results  Component Value Date   HGBA1C 7.1 (H) 12/23/2022   HGBA1C 11.9 (H) 06/15/2022   HGBA1C 8.6 (A) 12/05/2021   HGBA1C 8.6 12/05/2021   HGBA1C 8.6 (A) 12/05/2021   HGBA1C 8.6 (A) 12/05/2021   ESRSEDRATE 25 (H) 07/03/2022   ESRSEDRATE 11 12/30/2021   ESRSEDRATE 12 12/29/2021   CRP <1.0 07/03/2022   CRP 0.7 12/30/2021   CRP 1.1 (H) 12/29/2021   REPTSTATUS 04/11/2022 FINAL 04/09/2022   CULT >=100,000 COLONIES/mL PSEUDOMONAS AERUGINOSA (A) 04/09/2022   LABORGA PSEUDOMONAS AERUGINOSA (A) 04/09/2022     Lab Results  Component Value Date   ALBUMIN 4.2 12/23/2022   ALBUMIN 4.0 07/03/2022   ALBUMIN 3.2 (L) 12/28/2021    Lab Results  Component Value Date   MG 1.7 12/18/2021   MG 1.4 (L) 12/16/2021   MG 1.5 (L) 08/15/2021   No results found for: "VD25OH"  No results found for: "PREALBUMIN"    Latest Ref Rng & Units 12/23/2022    2:23 PM 07/03/2022    1:38 PM 06/15/2022    2:32 PM  CBC EXTENDED  WBC 4.0 - 10.5 K/uL 6.2  4.6  5.4   RBC 4.22 - 5.81 Mil/uL 6.27  5.80  6.05   Hemoglobin 13.0 - 17.0 g/dL  19.4 Repeated and verified X2.  17.7  18.6   HCT 39.0 - 52.0 % 56.6 Repeated and verified X2.  53.3  55.1   Platelets 150.0 - 400.0 K/uL 193.0  179.0  194   NEUT# 1.4 - 7.7 K/uL 4.7     Lymph# 0.7 - 4.0 K/uL 0.7        There is no height or weight on file to calculate BMI.  Orders:  Orders Placed This Encounter  Procedures   MR TIBIA FIBULA RIGHT WO CONTRAST   No orders of the defined types were placed in this encounter.    Procedures: No procedures performed  Clinical Data: No additional findings.  ROS:  All other systems negative, except as noted in the HPI. Review of Systems  Objective: Vital Signs: There were no vitals taken for this visit.  Specialty Comments:  No specialty comments available.  PMFS History: Patient Active Problem List   Diagnosis Date Noted   Chronic back pain 12/24/2022   Brittle  diabetes mellitus (HCC) 07/24/2022   Malaise 06/22/2022   Polycythemia 06/22/2022   Amputation below knee (HCC) 04/23/2022   PTSD (post-traumatic stress disorder) 04/23/2022   Generalized anxiety disorder 01/21/2022   Episode of unresponsiveness 12/15/2021   Acute respiratory failure with hypoxia (HCC) secondary to suspected aspiration pneumonia 12/15/2021   Renal mass 12/15/2021   Stage IV pressure ulcer of sacral region (HCC) 09/15/2021   Insomnia 09/14/2021   Chronic systolic CHF (congestive heart failure) (HCC) 09/14/2021   Hypogonadism in male 09/14/2021   Chronic pain 09/14/2021   Pseudohyponatremia 09/13/2021   Normocytic anemia 09/13/2021   Discitis of thoracic region    Paroxysmal atrial flutter (HCC) 07/22/2021   Chronic diastolic (congestive) heart failure (HCC) 05/16/2021   Lumbar spondylosis 03/25/2021   H/O amputation of leg through tibia and fibula (HCC) 11/10/2019   Degeneration of lumbar intervertebral disc 11/10/2019   Hypercholesterolemia 11/10/2019   S/P bilateral below knee amputation (HCC) 10/30/2019   Dyslipidemia 10/30/2019   Type 1 diabetes mellitus with diabetic polyneuropathy, with long-term current use of insulin (HCC) 10/12/2019   Right below-elbow amputee 10/12/2019   Hypertensive disorder 09/25/2019   Urinary hesitancy 09/25/2019   Prosthesis adjustments 09/25/2019   Erectile dysfunction 09/25/2019   Palliative care status 09/25/2019   Pulmonary nodules 09/25/2019   Depressive disorder 09/25/2019   Past Medical History:  Diagnosis Date   Acquired complex renal cyst 09/25/2019   AKI (acute kidney injury) (HCC) 07/22/2021   Cancer (HCC)    renal   Chest pain 07/22/2021   Complication of anesthesia    Decubitus ulcer of sacral area 12/15/2021   Depression    Diabetes mellitus without complication (HCC)    Diabetic infection of left foot (HCC) 04/17/2021   Episode of unresponsiveness 12/15/2021   Gas gangrene of foot (HCC) 07/22/2021    Healthcare maintenance 09/25/2019   Hematuria 09/25/2019   History of kidney stones    Lactic acidosis 12/15/2021   Leukocytosis 09/13/2021   Osteomyelitis (HCC)    Sacral pressure injury of skin 07/27/2021   Septic shock (HCC) 07/22/2021   Severe sepsis with acute organ dysfunction (HCC) 07/22/2021   Shortness of breath 05/16/2021   Stage 3a chronic kidney disease (HCC) 09/25/2019   Systolic dysfunction    Type 2 diabetes mellitus 10/12/2019    Family History  Problem Relation Age of Onset   Anxiety disorder Mother    Cancer Father    Coronary artery disease Father     Past  Surgical History:  Procedure Laterality Date   AMPUTATION Left 07/23/2021   Procedure: LEFT BELOW KNEE AMPUTATION;  Surgeon: Nadara Mustard, MD;  Location: [facility] OR;  Service: Orthopedics;  Laterality: Left;   BELOW KNEE LEG AMPUTATION Right    BUBBLE STUDY  07/29/2021   Procedure: BUBBLE STUDY;  Surgeon: Quintella Reichert, MD;  Location: [facility] ENDOSCOPY;  Service: Cardiovascular;;   CARDIOVERSION N/A 07/29/2021   Procedure: CARDIOVERSION;  Surgeon: Quintella Reichert, MD;  Location: [facility] ENDOSCOPY;  Service: Cardiovascular;  Laterality: N/A;   RADIOLOGY WITH ANESTHESIA N/A 08/05/2021   Procedure: MRI LUMBAR WITH AND WITHOUT; THORASIC SPINE WITH AND WITHOUT WITH ANESTHESIA;  Surgeon: Radiologist, Medication, MD;  Location: [facility] OR;  Service: Radiology;  Laterality: N/A;   RADIOLOGY WITH ANESTHESIA N/A 08/07/2021   Procedure: MRI WITH LUMBER WITH AND WITHOUT CONTRAST,THORACIC WITH AND WITHOUT CONTRAST;  Surgeon: Radiologist, Medication, MD;  Location: [facility] OR;  Service: Radiology;  Laterality: N/A;   RADIOLOGY WITH ANESTHESIA N/A 09/13/2021   Procedure: MRI WITH ANESTHESIA;  Surgeon: Julieanne Cotton, MD;  Location: [facility] OR;  Service: Radiology;  Laterality: N/A;   TEE WITHOUT CARDIOVERSION N/A 07/29/2021   Procedure: TRANSESOPHAGEAL ECHOCARDIOGRAM (TEE);  Surgeon: Quintella Reichert, MD;  Location: [facility] ENDOSCOPY;  Service:  Cardiovascular;  Laterality: N/A;   Social History   Occupational History   Not on file  Tobacco Use   Smoking status: Never   Smokeless tobacco: Current    Types: Chew  Vaping Use   Vaping status: Never Used  Substance and Sexual Activity   Alcohol use: Not Currently   Drug use: Never   Sexual activity: Not Currently

## 2023-06-18 NOTE — Telephone Encounter (Signed)
See below

## 2023-06-20 ENCOUNTER — Other Ambulatory Visit: Payer: Medicare HMO

## 2023-06-21 ENCOUNTER — Telehealth: Payer: Self-pay

## 2023-06-21 NOTE — Telephone Encounter (Signed)
See referral notes.

## 2023-06-21 NOTE — Telephone Encounter (Signed)
Roger, a [employer] conservation officer  called concerning clinical notes being sent for patient to have MRI done.  Representative stated that he left a message for the practice admin. Representative was very rude and demanding, and gave the patient's name as if he were the patient, but the patient was also on the phone, but did not say anything until the end of the call.  Stated that he will call back at 10am on tomorrow.

## 2023-06-24 ENCOUNTER — Ambulatory Visit (INDEPENDENT_AMBULATORY_CARE_PROVIDER_SITE_OTHER): Payer: Medicare HMO | Admitting: Internal Medicine

## 2023-06-24 ENCOUNTER — Encounter: Payer: Self-pay | Admitting: Internal Medicine

## 2023-06-24 VITALS — BP 146/100 | HR 91 | Ht 75.0 in

## 2023-06-24 DIAGNOSIS — Z89512 Acquired absence of left leg below knee: Secondary | ICD-10-CM | POA: Diagnosis not present

## 2023-06-24 DIAGNOSIS — D751 Secondary polycythemia: Secondary | ICD-10-CM

## 2023-06-24 DIAGNOSIS — E291 Testicular hypofunction: Secondary | ICD-10-CM

## 2023-06-24 DIAGNOSIS — Z515 Encounter for palliative care: Secondary | ICD-10-CM

## 2023-06-24 DIAGNOSIS — E119 Type 2 diabetes mellitus without complications: Secondary | ICD-10-CM

## 2023-06-24 DIAGNOSIS — E7800 Pure hypercholesterolemia, unspecified: Secondary | ICD-10-CM

## 2023-06-24 DIAGNOSIS — M5136 Other intervertebral disc degeneration, lumbar region with discogenic back pain only: Secondary | ICD-10-CM

## 2023-06-24 DIAGNOSIS — G8929 Other chronic pain: Secondary | ICD-10-CM

## 2023-06-24 DIAGNOSIS — M19019 Primary osteoarthritis, unspecified shoulder: Secondary | ICD-10-CM | POA: Diagnosis not present

## 2023-06-24 DIAGNOSIS — Z0189 Encounter for other specified special examinations: Secondary | ICD-10-CM

## 2023-06-24 DIAGNOSIS — I5032 Chronic diastolic (congestive) heart failure: Secondary | ICD-10-CM | POA: Diagnosis not present

## 2023-06-24 DIAGNOSIS — E1042 Type 1 diabetes mellitus with diabetic polyneuropathy: Secondary | ICD-10-CM

## 2023-06-24 DIAGNOSIS — E1169 Type 2 diabetes mellitus with other specified complication: Secondary | ICD-10-CM | POA: Diagnosis not present

## 2023-06-24 DIAGNOSIS — Z89619 Acquired absence of unspecified leg above knee: Secondary | ICD-10-CM | POA: Diagnosis not present

## 2023-06-24 DIAGNOSIS — Z993 Dependence on wheelchair: Secondary | ICD-10-CM

## 2023-06-24 DIAGNOSIS — Z89511 Acquired absence of right leg below knee: Secondary | ICD-10-CM | POA: Diagnosis not present

## 2023-06-24 DIAGNOSIS — M47816 Spondylosis without myelopathy or radiculopathy, lumbar region: Secondary | ICD-10-CM | POA: Diagnosis not present

## 2023-06-24 DIAGNOSIS — Z7689 Persons encountering health services in other specified circumstances: Secondary | ICD-10-CM

## 2023-06-24 DIAGNOSIS — M549 Dorsalgia, unspecified: Secondary | ICD-10-CM | POA: Diagnosis not present

## 2023-06-24 DIAGNOSIS — E663 Overweight: Secondary | ICD-10-CM

## 2023-06-24 DIAGNOSIS — G894 Chronic pain syndrome: Secondary | ICD-10-CM | POA: Diagnosis not present

## 2023-06-24 DIAGNOSIS — L89154 Pressure ulcer of sacral region, stage 4: Secondary | ICD-10-CM

## 2023-06-24 DIAGNOSIS — Z23 Encounter for immunization: Secondary | ICD-10-CM

## 2023-06-24 MED ORDER — TESTOSTERONE CYPIONATE 200 MG/ML IM SOLN
200.0000 mg | INTRAMUSCULAR | 0 refills | Status: DC
Start: 2023-06-24 — End: 2023-12-29

## 2023-06-24 MED ORDER — TESTOSTERONE CYPIONATE 200 MG/ML IM SOLN
200.0000 mg | INTRAMUSCULAR | 0 refills | Status: DC
Start: 2023-06-24 — End: 2023-06-24

## 2023-06-24 MED ORDER — TESTOSTERONE CYPIONATE 200 MG/ML IM SOLN: 200.0000 mg | INTRAMUSCULAR | 0 refills | Status: DC

## 2023-06-24 NOTE — Assessment & Plan Note (Signed)
Bilateral Knee Amputations with Prosthetic Pain   He is experiencing severe pain and inflammation at the site of his prosthetics, which limits mobility and use. There is a suspicion of fractures and a potential need for prosthetic revision. We will refer him to physical therapy for evaluation and management and consider an MRI to further evaluate the suspected fractures.

## 2023-06-24 NOTE — Assessment & Plan Note (Signed)
  Testosterone Use with High Hemoglobin   His high hemoglobin levels are likely secondary to testosterone use, which increases his risk for stroke. He is on daily baby aspirin for stroke prevention. We will order blood work to monitor hemoglobin levels and consider phlebotomy if levels are over 20 to reduce stroke risk. He should continue daily baby aspirin.  He high pressures to continue on this medicine despite this risks he fully understands. I repeatedly try to convince him to discontinue this medication(s).  I agree reluctantly to continue with agreement who will undergo phlebotomy if hg is still elevated. Lab Results  Component Value Date   TESTOSTERONE 853.25 12/23/2022

## 2023-06-24 NOTE — Patient Instructions (Signed)
VISIT SUMMARY:  During our visit, we discussed your ongoing pain and discomfort related to your prosthetics, your need for an electric wheelchair, your concerns about your high hemoglobin levels due to testosterone treatment, and your diabetes management. We have developed a plan to address each of these issues to improve your overall health and quality of life.  YOUR PLAN:  -PROSTHETIC PAIN: You're experiencing severe pain at the base of your prosthetics, which is limiting your mobility. We suspect there might be fractures and a need to revise your prosthetics. We'll refer you to physical therapy for evaluation and management, and we're considering an MRI to further investigate the suspected fractures.  -NEED FOR ELECTRIC WHEELCHAIR: Due to your severe shoulder pain and difficulty with mobility, you're unable to use a manual wheelchair. We'll order an electric wheelchair for you and document the necessary clinical notes for insurance approval. We'll also refer you to physical therapy for additional documentation and approval.  -TESTOSTERONE USE WITH HIGH HEMOGLOBIN: Your high hemoglobin levels are likely due to your testosterone use, which increases your risk for stroke. We'll order blood work to monitor your hemoglobin levels and consider a procedure to remove some blood if levels are over 20 to reduce your stroke risk. Please continue taking your daily baby aspirin.  -DIABETES MANAGEMENT: You're managing your diabetes with insulin, and your recent blood sugar level (A1C) was 7.1. We'll order blood work including A1C and continue your current insulin regimen. We've scheduled a follow-up in one month to review your blood work results and diabetes management.  Lab Results  Component Value Date   HGBA1C 7.1 (H) 12/23/2022   HGBA1C 11.9 (H) 06/15/2022   HGBA1C 8.6 (A) 12/05/2021   HGBA1C 8.6 12/05/2021   HGBA1C 8.6 (A) 12/05/2021   HGBA1C 8.6 (A) 12/05/2021     INSTRUCTIONS:  Please continue  your current medications and treatments as prescribed. We'll be ordering blood work to monitor your hemoglobin levels and diabetes management. We'll also be referring you to physical therapy for evaluation and management of your prosthetic pain and for additional documentation for your electric wheelchair. We've scheduled a follow-up appointment in one month to review your blood work results and discuss your diabetes management. If you have any questions or concerns before then, please don't hesitate to contact the office.

## 2023-06-24 NOTE — Progress Notes (Signed)
[facility]: [phone]   -- Medical Office Visit --  Patient:  [name]      Age: 57 y.o.       Sex:  male  Date:   06/24/2023 Patient Care Team: Lula Olszewski, MD as PCP - General (Internal Medicine) Thurmon Fair, MD as PCP - Cardiology (Cardiology) Sheran Luz, MD as Consulting Physician (Physical Medicine and Rehabilitation) Crist Fat, MD as Attending Physician (Urology) Duanne Guess, MD as Consulting Physician (General Surgery) [facility] (Orthopedic Surgery) Today's Healthcare Provider: Lula Olszewski, MD    Assessment & Plan Encounter for wheelchair assessment # Electric Wheelchair Order and Additional Documentation  ## 1. Patient History and Diagnosis  - Patient Name: [name]  - Date of Birth: [birth year]  - Primary Diagnosis: Severe bilateral shoulder arthritis - Secondary Diagnoses:    - Bilateral knee amputations   - Severe knee pain associated with prosthetic use  ### Medical History: - Patient has undergone bilateral below knee amputations - Patient has severe shoulder arthritis, limiting upper extremity function, cannot use manual chair - Patient has attempted lower extremity prosthetic use but experiences severe pain, limiting mobility, cannot tolerate.  ## 2. Functional Assessment  - Patient is unable to ambulate independently due to bilateral knee amputations - Patient cannot effectively use prosthetics due to severe knee pain and complications of prosthetics - Upper extremity function is severely limited by shoulder arthritis - Patient is currently wheelchair-bound - Patient is unable to propel a manual wheelchair due to shoulder limitations - Patient has the cognitive ability to safely operate an electric wheelchair  ### Activities of Daily Living (ADLs): - Patient is unable to perform necessary ADLs without powered mobility - Specifically, patient cannot:   - Access the bathroom  independently   - Prepare meals in the kitchen   - Perform other essential household tasks  ## 3. Home Evaluation  - Patient's home has tight corners that prevent the use of a scooter - An electric wheelchair is necessary to navigate the home environment - The electric wheelchair will be used exclusively inside the home  ## 4. Wheelchair Specifications  Based on the patient's needs and home environment, we recommend:  - Type: Electric-powered wheelchair - Size: 5'0 with no legs, 6'3" when he had legs, 36" waist, muscular arms. - Features:   - Joystick control   - Adjustable seat height   - Tilt and recline functions to aid in pressure relief   - Narrow turning radius for indoor maneuverability  ## 5. Additional Documentation Will fax to [employer] - [X]  Face-to-face examination completed on: 06/24/23 - [X]  Functional assessment performed 06/24/23 face-to-face - [X]  Attach relevant imaging studies (e.g., X-rays of shoulders and residual limbs) - [X]  Include any relevant specialist consultations (e.g., orthopedics, physical therapy)  ## Justification Statement  This patient requires an electric wheelchair due to bilateral knee amputations and severe shoulder arthritis, which prevent effective use of prosthetics or a manual wheelchair. The patient is cognitively capable of operating an electric wheelchair safely. Without this device, the patient will be unable to perform essential activities of daily living independently within the home, including accessing the bathroom and preparing meals. A scooter is not suitable due to the layout of the patient's home. The electric wheelchair is the most appropriate and cost-effective solution to meet the patient's mobility needs and maintain their independence in performing ADLs.  [Physician's Signature] ______Ryan Jon Billings, MD________________ [Date] ____10/17/2024___________ Shoulder arthritis  Wheelchair dependence He reports severe  shoulder  pain and difficulty with mobility due to prosthetic pain, rendering him unable to use a manual wheelchair. We will order an electric wheelchair and document extensive clinical notes for insurance approval. Additionally, we will refer him to physical therapy for additional documentation and approval. Chronic diastolic (congestive) heart failure (HCC)  Chronic back pain, unspecified back location, unspecified back pain laterality  Stage IV pressure ulcer of sacral region [facility])  Chronic pain syndrome  Degeneration of intervertebral disc of lumbar region with discogenic back pain  History of below-knee amputation of right lower extremity (HCC)  Lumbar spondylosis  Palliative care status  S/P bilateral below knee amputation (HCC) Bilateral Knee Amputations with Prosthetic Pain   He is experiencing severe pain and inflammation at the site of his prosthetics, which limits mobility and use. There is a suspicion of fractures and a potential need for prosthetic revision. We will refer him to physical therapy for evaluation and management and consider an MRI to further evaluate the suspected fractures. Physical therapy evaluation, initial  Electric wheelchair dependence  Type 2 diabetes mellitus without complication, unspecified whether long term insulin use (HCC) He is managing diabetes with insulin, and his recent A1C was 7.1. We will order blood work including A1C and continue the current insulin regimen. A follow-up is scheduled in one month to review blood work results and diabetes management.  Supposed to following with endo but has not been History of diabetes-related lower limb amputation (HCC)  Overweight  Hypogonadism in male  Testosterone Use with High Hemoglobin   His high hemoglobin levels are likely secondary to testosterone use, which increases his risk for stroke. He is on daily baby aspirin for stroke prevention. We will order blood work to monitor hemoglobin levels  and consider phlebotomy if levels are over 20 to reduce stroke risk. He should continue daily baby aspirin.  He high pressures to continue on this medicine despite this risks he fully understands. I repeatedly try to convince him to discontinue this medication(s).  I agree reluctantly to continue with agreement who will undergo phlebotomy if hg is still elevated. Lab Results  Component Value Date   TESTOSTERONE 853.25 12/23/2022    Need for influenza vaccination  Type 1 diabetes mellitus with diabetic polyneuropathy, with long-term current use of insulin (HCC)  Diagnoses and all orders for this visit: Encounter for wheelchair assessment Shoulder arthritis -     DME Wheelchair electric -     Ambulatory referral to Physical Therapy -     Comprehensive metabolic panel -     CBC with Differential/Platelet -     TSH Rfx on Abnormal to Free T4 -     Lipid Panel w/reflex Direct LDL -     Testosterone Wheelchair dependence -     DME Wheelchair electric -     Ambulatory referral to Physical Therapy Chronic diastolic (congestive) heart failure (HCC) -     DME Wheelchair electric -     Ambulatory referral to Physical Therapy -     Comprehensive metabolic panel -     CBC with Differential/Platelet -     TSH Rfx on Abnormal to Free T4 -     Lipid Panel w/reflex Direct LDL -     Testosterone Chronic back pain, unspecified back location, unspecified back pain laterality -     DME Wheelchair electric -     Ambulatory referral to Physical Therapy -     Comprehensive metabolic panel -     CBC with Differential/Platelet -  TSH Rfx on Abnormal to Free T4 -     Lipid Panel w/reflex Direct LDL -     Testosterone Stage IV pressure ulcer of sacral region [facility]) -     DME Wheelchair electric -     Ambulatory referral to Physical Therapy Chronic pain syndrome -     DME Wheelchair electric -     Ambulatory referral to Physical Therapy Degeneration of intervertebral disc of lumbar region with  discogenic back pain -     DME Wheelchair electric -     Ambulatory referral to Physical Therapy History of below-knee amputation of right lower extremity (HCC) -     DME Wheelchair electric -     Ambulatory referral to Physical Therapy Lumbar spondylosis -     DME Wheelchair electric -     Ambulatory referral to Physical Therapy Palliative care status -     DME Wheelchair electric -     Ambulatory referral to Physical Therapy Right below-elbow amputee -     DME Wheelchair electric -     Ambulatory referral to Physical Therapy S/P bilateral below knee amputation (HCC) -     DME Wheelchair electric -     Ambulatory referral to Physical Therapy Physical therapy evaluation, initial -     DME Wheelchair electric -     Ambulatory referral to Physical Therapy Electric wheelchair dependence -     DME Wheelchair electric -     Ambulatory referral to Physical Therapy Type 2 diabetes mellitus without complication, unspecified whether long term insulin use (HCC) History of diabetes-related lower limb amputation (HCC) -     Comprehensive metabolic panel -     CBC with Differential/Platelet -     TSH Rfx on Abnormal to Free T4 -     Lipid Panel w/reflex Direct LDL -     Testosterone Overweight -     Comprehensive metabolic panel -     CBC with Differential/Platelet -     TSH Rfx on Abnormal to Free T4 -     Lipid Panel w/reflex Direct LDL -     Testosterone Hypogonadism in male -     testosterone cypionate (DEPO-TESTOSTERONE) 200 MG/ML injection; Inject 1 mL (200 mg total) into the muscle every 14 (fourteen) days. Significant Risks of heart attack, stroke with continued use. Reducing dose as tolerated highly recommended Need for influenza vaccination Type 1 diabetes mellitus with diabetic polyneuropathy, with long-term current use of insulin (HCC) -     HgB A1c  Recommended follow-up: Return in about 1 month (around 07/25/2023) for review problems and medications. Future Appointments   Date Time Provider Department Center  07/28/2023  1:20 PM Lula Olszewski, MD [facility]  03/27/2024  3:00 PM [facility] ANNUAL WELLNESS VISIT 2 [facility]        Subjective   57 y.o. male who has Hypertensive disorder; Urinary hesitancy; Prosthesis adjustments; Erectile dysfunction; Palliative care status; Pulmonary nodules; Depressive disorder; Type 1 diabetes mellitus with diabetic polyneuropathy, with long-term current use of insulin (HCC); S/P bilateral below knee amputation (HCC); Dyslipidemia; Chronic diastolic (congestive) heart failure (HCC); Paroxysmal atrial flutter (HCC); Discitis of thoracic region; Pseudohyponatremia; Normocytic anemia; Insomnia; Chronic systolic CHF (congestive heart failure) (HCC); Hypogonadism in male; Chronic pain; Stage IV pressure ulcer of sacral region [facility]); Episode of unresponsiveness; Acute respiratory failure with hypoxia (HCC) secondary to suspected aspiration pneumonia; Renal mass; Generalized anxiety disorder; Amputation below knee (HCC); 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 His reasons/main concerns/chief complaints for today's office visit are 6 month follow-up   ------------------------------------------------------------------------------------------------------------------------ AI-Extracted: Discussed the use of AI scribe software for clinical note transcription with the patient, who gave verbal consent to proceed.  History of Present Illness   The patient, a bilateral knee amputee with a history of severe shoulder arthritis and back pain, presents with significant difficulty using his prosthetics. The patient reports persistent pain at the base of his prosthetic, which has resulted in his reliance on a  wheelchair for mobility. The patient describes the pain as a 10 out of 10, with inflammation and redness noted around the area. Despite attempts at nerve block treatments, the patient reports no relief.  The patient also reports a history of a stage 4 ulcer, which has since healed but continues to cause discomfort. He expresses frustration with his current prosthetic provider, citing a lack of responsiveness to his complaints of pain and discomfort. The patient has been unable to engage in physical activity due to the pain, which he believes has contributed to weight gain and a decline in his overall health.  The patient also reports a history of diabetes, which he manages with insulin and dietary modifications. He expresses concern about his blood work, specifically his hemoglobin levels, which he believes are being affected by his testosterone treatment. The patient acknowledges the potential risks associated with his testosterone treatment but expresses a strong desire to continue the therapy due to its positive impact on his mental health.  The patient is seeking an electric wheelchair to improve his mobility and quality of life. He expresses concern about the size of the wheelchair, noting the need for it to fit within his apartment and elevator. The patient also mentions the desire to use the wheelchair outside to assist with walking his dog, which he currently struggles with due to his shoulder and back pain.      1. Face-to-Face Examination   Conduct and document a comprehensive physical examination done today  Assess and document upper and lower extremity strength and range of motion he has no lower legs for strength assessment and upper arms are weak due to severe shoulder weakness 1/5 strength bilateral due to rotator cuff tendonitsi  Evaluate and document pain levels, particularly in shoulders and residual limbs pain is 10/10 in R below knee amputation stump and 10/10 when trying to exert  against resistances.  Assess skin integrity, especially at amputation sites - no ulceration remains but history of decubitus  2. Functional Mobility Assessment   Observe and document patient's current mobility status, currently depends on electric wheelchair but does not have so we push him  Assess ability to transfer (bed to chair, chair to toilet, etc.)- can transfer from electric wheelchair to bed and toilet with assistance from wife.   Evaluate ability to propel a manual wheelchair- he attempted to propel but winced in pain and could not tolerate it due to shoulder pain.  Test patient's ability to operate power wheelchair controls- he can operate wheelchair controls  3. Cognitive Assessment   Conduct Mini-Mental State Examination (MMSE) or Montreal Cognitive Assessment (MoCA) - he is 30/30  Assess patient's ability to understand and follow instructions excellent  Evaluate judgment and safety awareness- excellent  4. Activities of Daily Living (ADL) Assessment   Observe and document patient's ability to perform basic ADLs  cannot go to bathroom currently without electric wheelchair.  Assess ability to perform instrumental ADLs (meal preparation, housekeeping, etc.)= he cannot do these  Document how power wheelchair will improve ability to perform ADLs= he iwll be better able to care for self and cook/clean/toilet/bathe  5. Home Assessment- patient will do if needed.    Obtain detailed measurements of doorways, hallways  Document obstacles and accessibility challenges within the home  Assess flooring types and thresholds  6. Prosthetic Evaluation   Document history of prosthetic use- not able to use R below knee amputation prosthetic at all due to poor fit and can't fix it. Nerve block failed  Assess current prosthetic fit and function   Evaluate pain levels associated with prosthetic use as 10/10  7. Pressure Mapping   Conducted pressure mapping test to assess risk of pressure  sores- he is high risk but has special seat cushion  Document need for specific seating system or cushions- he needs pressure ulcer prevention  8. Balance and Coordination Tests   Conduct Berg Balance Scale cannot be done due to below knee amputation bilateral.  Assess trunk control and sitting balance - sitting balance is decent not great  9. Vision and Hearing Tests   Conduct basic vision screening - he can read   Perform hearing assessment - no gross hearing loss  10. Medical Necessity Documentation   A manual wheelchair or scooter is not sufficient because: scooter can't do tight turns, manual will hurt shoulders too much.  Patient is not able to be mobile in home to complete Instrumental Activities of Daily Living (IADLs) or Activities of Daily Living (ADLs) without electric wheelchair. Therefore electric wheelchair is medically necessary  11. Specialty Consultations    Refer care to  physical therapist for additional assessment  12. Imaging Studies   Offered to obtain recent X-rays of shoulders- he can't have surgery so no plan to do  Request X-rays or other imaging of residual limbs- will include  13. Pain Assessment   Used standardized pain scale to quantify pain levels, 10/10 with efforts to use manual wheelchair, in shoulder. 10/10 with attempts to use prosthetic in legs.  Document pain medication usage and effectiveness  14. Wheelchair Fitting and Trial   Recommend Conduct trial with recommended power wheelchair patient size described.  Patient has ability to safely operate the wheelchair  Wheelchair meets all of patient's needs for now due to support from a friend.  15. Patient/Caregiver Training Plan   We have a plan for patient education on wheelchair use  At today's visit he has caregiver's male friend present who has the ability to assist with wheelchair use and maintenance   He has a past medical history of Acquired complex renal cyst (09/25/2019), AKI  (acute kidney injury) (HCC) (07/22/2021), Cancer (HCC), Chest pain (07/22/2021), Complication of anesthesia, Decubitus ulcer of sacral area (12/15/2021), Depression, Diabetes mellitus without complication (HCC), Diabetic infection of left foot (HCC) (04/17/2021), Episode of unresponsiveness (12/15/2021), Gas gangrene of foot (HCC) (07/22/2021), Healthcare maintenance (09/25/2019), Hematuria (09/25/2019), History of kidney stones, Lactic acidosis (12/15/2021), Leukocytosis (09/13/2021), Osteomyelitis (HCC), Sacral pressure injury of skin (07/27/2021), Septic shock (HCC) (07/22/2021), Severe sepsis with acute organ dysfunction (HCC) (07/22/2021), Shortness of breath (05/16/2021), Stage 3a chronic kidney disease (HCC) (09/25/2019), Systolic dysfunction, and Type 2 diabetes mellitus (10/12/2019).  Problem list overviews that were updated at today's visit: No problems updated. Current Outpatient Medications on File Prior to Visit  Medication Sig   ACCU-CHEK GUIDE test strip USE UP  TO SIX TIMES DAILY TO CHECK BLOOD SUGARS   aspirin EC 81 MG tablet Take 81 mg by mouth daily. Swallow whole.   baclofen (LIORESAL) 10 MG tablet TAKE 1 TABLET BY MOUTH THREE TIMES DAILY   blood glucose meter kit and supplies KIT Dispense based on patient and insurance preference. Use up to four times daily as directed.   furosemide (LASIX) 20 MG tablet Take 1 tablet by mouth once daily   insulin NPH-regular Human (NOVOLIN 70/30 RELION) (70-30) 100 UNIT/ML injection Inject 15 Units into the skin 2 (two) times daily with a meal. (Patient taking differently: Inject 3-12 Units into the skin 2 (two) times daily with a meal. Sliding scale)   lisinopril (ZESTRIL) 10 MG tablet Take 1 tablet by mouth once daily   Oxycodone HCl 10 MG TABS Take 10 mg by mouth 3 (three) times daily as needed (Pain).   pregabalin (LYRICA) 75 MG capsule Take 75 mg by mouth 2 (two) times daily.   rosuvastatin (CRESTOR) 20 MG tablet Take 1 tablet (20 mg total) by  mouth daily.   sildenafil (VIAGRA) 100 MG tablet Take 100 mg by mouth daily as needed.   tamsulosin (FLOMAX) 0.4 MG CAPS capsule Take 0.4 mg by mouth daily.   No current facility-administered medications on file prior to visit.   Medications Discontinued During This Encounter  Medication Reason   testosterone cypionate (DEPO-TESTOSTERONE) 200 MG/ML injection Reorder   testosterone cypionate (DEPO-TESTOSTERONE) 200 MG/ML injection    testosterone cypionate (DEPO-TESTOSTERONE) 200 MG/ML injection      Objective   Physical Exam  BP (!) 146/100 (BP Location: Left Arm, Patient Position: Sitting)   Pulse 91   Ht 6\' 3"  (1.905 m)   SpO2 97%   BMI 25.25 kg/m  Wt Readings from Last 10 Encounters:  03/22/23 202 lb (91.6 kg)  06/15/22 202 lb (91.6 kg)  01/21/22 208 lb (94.3 kg)  12/31/21 208 lb (94.3 kg)  09/14/21 240 lb 4.8 oz (109 kg)  08/01/21 235 lb 14.3 oz (107 kg)  05/16/21 240 lb (108.9 kg)  04/24/21 238 lb (108 kg)  04/05/20 (!) 214 lb (97.1 kg)  04/04/20 (!) 229 lb (103.9 kg)   Vital signs reviewed.  Nursing notes reviewed. Weight trend reviewed. Abnormalities and Problem-Specific physical exam findings:  very inflamed below knee amputation stump on right. Tender Photographs Taken 06/24/2023 :       General Appearance:  No acute distress appreciable.   Well-groomed, healthy-appearing male.  Well proportioned with no abnormal fat distribution.  Good muscle tone. Pulmonary:  Normal work of breathing at rest, no respiratory distress apparent. SpO2: 97 %  Musculoskeletal: Below knee amputation of left lower extremity is noted. Below knee amputation of right lower extremity is noted.   Neurological:  Awake, alert, oriented, and engaged.  No obvious focal neurological deficits or cognitive impairments.  Sensorium seems unclouded.   Speech is clear and coherent with logical content. Psychiatric:  Appropriate mood, pleasant and cooperative demeanor, thoughtful and engaged during the  exam  Results   LABS Hb: 18.6 Hb: 17.7 Hb: 19.4 A1c: 7.1        No results found for any visits on 06/24/23.  Office Visit on 12/23/2022  Component Date Value   Testosterone 12/23/2022 853.25    WBC 12/23/2022 6.2    RBC 12/23/2022 6.27 (H)    Hemoglobin 12/23/2022 19.4 Repeated and verified X2. (HH)    HCT 12/23/2022 56.6 Repeated and verified X2. (H)    MCV  12/23/2022 90.3    MCHC 12/23/2022 34.2    RDW 12/23/2022 13.7    Platelets 12/23/2022 193.0    Neutrophils Relative % 12/23/2022 76.9    Lymphocytes Relative 12/23/2022 11.7 (L)    Monocytes Relative 12/23/2022 9.3    Eosinophils Relative 12/23/2022 1.8    Basophils Relative 12/23/2022 0.3    Neutro Abs 12/23/2022 4.7    Lymphs Abs 12/23/2022 0.7    Monocytes Absolute 12/23/2022 0.6    Eosinophils Absolute 12/23/2022 0.1    Basophils Absolute 12/23/2022 0.0    Sodium 12/23/2022 139    Potassium 12/23/2022 4.2    Chloride 12/23/2022 103    CO2 12/23/2022 24    Glucose, Bld 12/23/2022 228 (H)    BUN 12/23/2022 25 (H)    Creatinine, Ser 12/23/2022 1.19    Total Bilirubin 12/23/2022 0.9    Alkaline Phosphatase 12/23/2022 100    AST 12/23/2022 30    ALT 12/23/2022 55 (H)    Total Protein 12/23/2022 6.8    Albumin 12/23/2022 4.2    GFR 12/23/2022 68.09    Calcium 12/23/2022 9.6    Cholesterol 12/23/2022 124    Triglycerides 12/23/2022 141.0    HDL 12/23/2022 45.20    VLDL 12/23/2022 28.2    LDL Cholesterol 12/23/2022 51    Total CHOL/HDL Ratio 12/23/2022 3    NonHDL 12/23/2022 79.26    Hgb A1c MFr Bld 12/23/2022 7.1 (H)   Lab on 07/03/2022  Component Date Value   MICRO NUMBER: 07/03/2022 [id number]    SPECIMEN QUALITY: 07/03/2022 Adequate    Source 07/03/2022 RT AC TK    STATUS: 07/03/2022 FINAL    Result: 07/03/2022 No growth after 5 days    COMMENT: 07/03/2022 Aerobic and anaerobic bottle received.    WBC 07/03/2022 4.6    RBC 07/03/2022 5.80    Platelets 07/03/2022 179.0    Hemoglobin 07/03/2022  17.7 (H)    HCT 07/03/2022 53.3 (H)    MCV 07/03/2022 91.8    MCHC 07/03/2022 33.2    RDW 07/03/2022 14.0    Sodium 07/03/2022 138    Potassium 07/03/2022 4.3    Chloride 07/03/2022 102    CO2 07/03/2022 28    Glucose, Bld 07/03/2022 195 (H)    BUN 07/03/2022 30 (H)    Creatinine, Ser 07/03/2022 1.12    Total Bilirubin 07/03/2022 0.5    Alkaline Phosphatase 07/03/2022 73    AST 07/03/2022 24    ALT 07/03/2022 29    Total Protein 07/03/2022 6.8    Albumin 07/03/2022 4.0    GFR 07/03/2022 73.47    Calcium 07/03/2022 9.5    CRP 07/03/2022 <1.0    Sed Rate 07/03/2022 25 (H)    No image results found.   [place] Extrem Low Right Ltd  Result Date: 05/11/2023 Limited MSK US of the right lower extremity, right proximal tibia and fibula (s/p BKA) was performed today. There is a hypoechoic line overlying the distal lateral fibula with small callous formation that is incompletely visualized - this could reflect artifact from prior BKA or stress-related fracture.The distal fibula was seen in both short and long axis. Medial to the fibula there was the associated neurovasculature, with identification of peroneal (fibular) nerve. Mild soft tissue swelling around the NV with brief cobblestoning but no significant hyperemia or fluid pocket identified within soft tissue. *Technically successful US-guided peroneal nerve hydodissection   No results found.     Attestation:  I have personally spent  62 minutes involved in  face-to-face and non-face-to-face activities for this patient on the day of the visit. Professional time spent includes the following activities:  Preparing to see the patient by reviewing medical records prior to and during the encounter; Obtaining, documenting, and reviewing an updated medical history; Performing a medically appropriate examination;  Evaluating, synthesizing, and documenting the available clinical information in the EMR;  Coordinating/Communicating with other health care  professionals; Independently interpreting results (not separately reported), Communicating, counseling, educating about results to the patient/family/caregiver (not separately reported); Collaboratively developing and communicating an individualized treatment plan with the patient; Placing medically necessary orders (for medications/tests/procedures/referrals);   This time was independent of any separately billable procedure(s).  The extended duration of this patient visit was medically necessary due to several factors:  The patient's health condition is multifaceted, requiring a comprehensive evaluation of patient and their past records to ensure accurate diagnosis and treatment planning; Effective patient education and communication, particularly for patients with complex care needs, often require additional time to ensure the patient (or caregivers) fully understand the care plan;  Coordination of care with other healthcare professionals and services depends on thorough documentation, extending both documentation time and visit durations.  Documentation requirements for electric wheelchairs are onerous, but obtaining one is critical for patient safety.  All these factors are integral to providing high-quality patient care and ensuring optimal health outcomes.   Additional Info: This encounter employed real-time, collaborative documentation. The patient actively reviewed and updated their medical record on a shared screen, ensuring transparency and facilitating joint problem-solving for the problem list, overview, and plan. This approach promotes accurate, informed care. The treatment plan was discussed and reviewed in detail, including medication safety, potential side effects, and all patient questions. We confirmed understanding and comfort with the plan. Follow-up instructions were established, including contacting the office for any concerns, returning if symptoms worsen, persist, or new symptoms develop,  and precautions for potential emergency department visits.

## 2023-06-25 ENCOUNTER — Other Ambulatory Visit: Payer: Self-pay

## 2023-06-25 ENCOUNTER — Telehealth: Payer: Self-pay

## 2023-06-25 LAB — CBC WITH DIFFERENTIAL/PLATELET
Basophils Absolute: 0 10*3/uL (ref 0.0–0.1)
Basophils Relative: 0.4 % (ref 0.0–3.0)
Eosinophils Absolute: 0.1 10*3/uL (ref 0.0–0.7)
Eosinophils Relative: 1.6 % (ref 0.0–5.0)
HCT: 56.9 % — ABNORMAL HIGH (ref 39.0–52.0)
Hemoglobin: 18.8 g/dL (ref 13.0–17.0)
Lymphocytes Relative: 9 % — ABNORMAL LOW (ref 12.0–46.0)
Lymphs Abs: 0.6 10*3/uL — ABNORMAL LOW (ref 0.7–4.0)
MCHC: 33 g/dL (ref 30.0–36.0)
MCV: 92.7 fL (ref 78.0–100.0)
Monocytes Absolute: 0.6 10*3/uL (ref 0.1–1.0)
Monocytes Relative: 8.3 % (ref 3.0–12.0)
Neutro Abs: 5.4 10*3/uL (ref 1.4–7.7)
Neutrophils Relative %: 80.7 % — ABNORMAL HIGH (ref 43.0–77.0)
Platelets: 210 10*3/uL (ref 150.0–400.0)
RBC: 6.14 Mil/uL — ABNORMAL HIGH (ref 4.22–5.81)
RDW: 14.2 % (ref 11.5–15.5)
WBC: 6.6 10*3/uL (ref 4.0–10.5)

## 2023-06-25 LAB — COMPREHENSIVE METABOLIC PANEL
ALT: 39 U/L (ref 0–53)
AST: 33 U/L (ref 0–37)
Albumin: 4 g/dL (ref 3.5–5.2)
Alkaline Phosphatase: 95 U/L (ref 39–117)
BUN: 18 mg/dL (ref 6–23)
CO2: 23 meq/L (ref 19–32)
Calcium: 9.4 mg/dL (ref 8.4–10.5)
Chloride: 103 meq/L (ref 96–112)
Creatinine, Ser: 1.07 mg/dL (ref 0.40–1.50)
GFR: 77.08 mL/min (ref >60.00)
Glucose, Bld: 235 mg/dL — ABNORMAL HIGH (ref 70–99)
Potassium: 4.5 meq/L (ref 3.5–5.1)
Sodium: 136 meq/L (ref 135–145)
Total Bilirubin: 0.5 mg/dL (ref 0.2–1.2)
Total Protein: 6.9 g/dL (ref 6.0–8.3)

## 2023-06-25 LAB — LIPID PANEL W/REFLEX DIRECT LDL
Cholesterol: 105 mg/dL (ref <200)
HDL: 37 mg/dL — ABNORMAL LOW (ref >=40)
LDL Cholesterol (Calc): 47 mg/dL
Non-HDL Cholesterol (Calc): 68 mg/dL (ref <130)
Total CHOL/HDL Ratio: 2.8 (calc) (ref <5.0)
Triglycerides: 127 mg/dL (ref <150)

## 2023-06-25 LAB — TSH RFX ON ABNORMAL TO FREE T4: TSH: 2.34 u[IU]/mL (ref 0.450–4.500)

## 2023-06-25 LAB — TESTOSTERONE: Testosterone: 745.16 ng/dL (ref 300.00–890.00)

## 2023-06-25 LAB — HEMOGLOBIN A1C: Hgb A1c MFr Bld: 8.1 % — ABNORMAL HIGH (ref 4.6–6.5)

## 2023-06-25 NOTE — Telephone Encounter (Signed)
Critical lab- hemoglobin is 18.8.

## 2023-06-25 NOTE — Telephone Encounter (Signed)
Opened in error

## 2023-06-27 ENCOUNTER — Encounter: Payer: Self-pay | Admitting: Internal Medicine

## 2023-06-28 ENCOUNTER — Encounter: Payer: Self-pay | Admitting: Orthopedic Surgery

## 2023-06-28 NOTE — Addendum Note (Signed)
Addended by: Lula Olszewski on: 06/28/2023 06:48 AM   Modules accepted: Orders

## 2023-06-28 NOTE — Assessment & Plan Note (Signed)
06/27/2023: Recent HbA1c 8.1% (06/24/2023), indicating suboptimal control. Plan:  Continue current insulin regimen Reinforce importance of blood glucose monitoring Consider endocrinology referral if control doesn't improve

## 2023-06-28 NOTE — Progress Notes (Signed)
06/27/2023: Labs reviewed. Critical hemoglobin elevation (18.8 g/dL) likely due to testosterone use. HbA1c elevated at 8.1%. Lipid panel mostly at goal. Hematology referral placed for polycythemia management. Discussed risks of current testosterone use with patient. Recommended phlebotomy and sleep study. Continue daily aspirin for stroke prevention. Follow up in one month to reassess.

## 2023-06-29 ENCOUNTER — Telehealth: Payer: Self-pay | Admitting: Radiology

## 2023-06-29 NOTE — Telephone Encounter (Signed)
Apparently, I was given the wrong result by [facility] lab staff. She had me to repeat the result back to her. Patient's A1c was actually 8.1.

## 2023-06-29 NOTE — Telephone Encounter (Signed)
Patient has called several times concerned about the right leg continuing to be red/swollen. MRI pending auth, new request submitted as expedited as infection is of concern.  Patient and provider updated.  Doing all we can to get MRI authorized.

## 2023-06-29 NOTE — Telephone Encounter (Signed)
Patient's review of lab results/notes confirmed.

## 2023-06-30 ENCOUNTER — Other Ambulatory Visit: Payer: Self-pay

## 2023-06-30 ENCOUNTER — Other Ambulatory Visit: Payer: Self-pay | Admitting: Family

## 2023-06-30 DIAGNOSIS — M79661 Pain in right lower leg: Secondary | ICD-10-CM

## 2023-07-01 ENCOUNTER — Other Ambulatory Visit: Payer: Self-pay | Admitting: Internal Medicine

## 2023-07-01 ENCOUNTER — Other Ambulatory Visit: Payer: Self-pay | Admitting: Cardiovascular Disease

## 2023-07-01 ENCOUNTER — Other Ambulatory Visit: Payer: Self-pay | Admitting: Physician Assistant

## 2023-07-01 DIAGNOSIS — E7800 Pure hypercholesterolemia, unspecified: Secondary | ICD-10-CM

## 2023-07-01 DIAGNOSIS — T8789 Other complications of amputation stump: Secondary | ICD-10-CM

## 2023-07-02 ENCOUNTER — Ambulatory Visit (INDEPENDENT_AMBULATORY_CARE_PROVIDER_SITE_OTHER): Payer: Medicare HMO

## 2023-07-02 ENCOUNTER — Other Ambulatory Visit (INDEPENDENT_AMBULATORY_CARE_PROVIDER_SITE_OTHER): Payer: Medicare HMO

## 2023-07-02 DIAGNOSIS — M79661 Pain in right lower leg: Secondary | ICD-10-CM

## 2023-07-02 NOTE — Progress Notes (Signed)
Patient is here for right leg x-ray only.

## 2023-07-05 ENCOUNTER — Ambulatory Visit: Payer: Medicare HMO | Admitting: Orthopedic Surgery

## 2023-07-07 ENCOUNTER — Ambulatory Visit (INDEPENDENT_AMBULATORY_CARE_PROVIDER_SITE_OTHER): Payer: Medicare HMO | Admitting: Family

## 2023-07-07 ENCOUNTER — Encounter: Payer: Self-pay | Admitting: Family

## 2023-07-07 DIAGNOSIS — M79661 Pain in right lower leg: Secondary | ICD-10-CM

## 2023-07-07 NOTE — Progress Notes (Signed)
Office Visit Note   Patient: [name]           Date of Birth: [birth year]           MRN: [id number] Visit Date: 07/07/2023              Requested by: Lula Olszewski, MD [address] [place],  [place] [address] PCP: Lula Olszewski, MD  Chief Complaint  Patient presents with   Right Leg - Pain    Hx right BKA      HPI: The patient is seen in follow-up.  He came for nurse visit last week to have radiographs of his right tibia and fibula.  These were required for proceeding with MRI scan of his right tibia and fibula.  He has had no change in his pain.  No new events.  Continues with redness and exquisite pain  Assessment & Plan: Visit Diagnoses: No diagnosis found.  Plan: We will proceed with MRI scan.  Radiographs unequivocal.  Follow-Up Instructions: Return p mri c duda.   Ortho Exam  Patient is alert, oriented, no adenopathy, well-dressed, normal affect, normal respiratory effort. On examination of the right residual limb this is well consolidated well-healed.  He is tender to palpation over the distal end of the fibula. he has pain with deep palpation.  No overlying soft tissue fluctuance. mild erythema. no warmth no cellulitis    Imaging: No results found. No images are attached to the encounter.  Labs: Lab Results  Component Value Date   HGBA1C 8.1 (H) 06/24/2023   HGBA1C 7.1 (H) 12/23/2022   HGBA1C 11.9 (H) 06/15/2022   ESRSEDRATE 25 (H) 07/03/2022   ESRSEDRATE 11 12/30/2021   ESRSEDRATE 12 12/29/2021   CRP <1.0 07/03/2022   CRP 0.7 12/30/2021   CRP 1.1 (H) 12/29/2021   REPTSTATUS 04/11/2022 FINAL 04/09/2022   CULT >=100,000 COLONIES/mL PSEUDOMONAS AERUGINOSA (A) 04/09/2022   LABORGA PSEUDOMONAS AERUGINOSA (A) 04/09/2022     Lab Results  Component Value Date   ALBUMIN 4.0 06/24/2023   ALBUMIN 4.2 12/23/2022   ALBUMIN 4.0 07/03/2022    Lab Results  Component Value Date   MG 1.7 12/18/2021   MG 1.4 (L) 12/16/2021   MG 1.5 (L)  08/15/2021   No results found for: "VD25OH"  No results found for: "PREALBUMIN"    Latest Ref Rng & Units 06/24/2023    2:11 PM 12/23/2022    2:23 PM 07/03/2022    1:38 PM  CBC EXTENDED  WBC 4.0 - 10.5 K/uL 6.6  6.2  4.6   RBC 4.22 - 5.81 Mil/uL 6.14  6.27  5.80   Hemoglobin 13.0 - 17.0 g/dL 57.8 Repeated and verified X2.  19.4 Repeated and verified X2.  17.7   HCT 39.0 - 52.0 % 56.9  56.6 Repeated and verified X2.  53.3   Platelets 150.0 - 400.0 K/uL 210.0  193.0  179.0   NEUT# 1.4 - 7.7 K/uL 5.4  4.7    Lymph# 0.7 - 4.0 K/uL 0.6  0.7       There is no height or weight on file to calculate BMI.  Orders:  No orders of the defined types were placed in this encounter.  No orders of the defined types were placed in this encounter.    Procedures: No procedures performed  Clinical Data: No additional findings.  ROS:  All other systems negative, except as noted in the HPI. Review of Systems  Objective: Vital Signs: There were no  vitals taken for this visit.  Specialty Comments:  No specialty comments available.  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/2022   Malaise 06/22/2022   Polycythemia 06/22/2022   Amputation below knee (HCC) 04/23/2022   PTSD (post-traumatic stress disorder) 04/23/2022   Generalized anxiety disorder 01/21/2022   Episode of unresponsiveness 12/15/2021   Acute respiratory failure with hypoxia (HCC) secondary to suspected aspiration pneumonia 12/15/2021   Renal mass 12/15/2021   Stage IV pressure ulcer of sacral region (HCC) 09/15/2021   Insomnia 09/14/2021   Chronic systolic CHF (congestive heart failure) (HCC) 09/14/2021   Hypogonadism in male 09/14/2021   Chronic pain 09/14/2021   Pseudohyponatremia 09/13/2021   Normocytic anemia 09/13/2021   Discitis of thoracic region    Paroxysmal atrial flutter (HCC) 07/22/2021   Chronic diastolic  (congestive) heart failure (HCC) 05/16/2021   Lumbar spondylosis 03/25/2021   H/O amputation of leg through tibia and fibula (HCC) 11/10/2019   Degeneration of lumbar intervertebral disc 11/10/2019   Hypercholesterolemia 11/10/2019   S/P bilateral below knee amputation (HCC) 10/30/2019   Dyslipidemia 10/30/2019   Type 1 diabetes mellitus with diabetic polyneuropathy, with long-term current use of insulin (HCC) 10/12/2019   Hypertensive disorder 09/25/2019   Urinary hesitancy 09/25/2019   Prosthesis adjustments 09/25/2019   Erectile dysfunction 09/25/2019   Palliative care status 09/25/2019   Pulmonary nodules 09/25/2019   Depressive disorder 09/25/2019   Past Medical History:  Diagnosis Date   Acquired complex renal cyst 09/25/2019   AKI (acute kidney injury) (HCC) 07/22/2021   Cancer (HCC)    renal   Chest pain 07/22/2021   Complication of anesthesia    Decubitus ulcer of sacral area 12/15/2021   Depression    Diabetes mellitus without complication (HCC)    Diabetic infection of left foot (HCC) 04/17/2021   Episode of unresponsiveness 12/15/2021   Gas gangrene of foot (HCC) 07/22/2021   Healthcare maintenance 09/25/2019   Hematuria 09/25/2019   History of kidney stones    Lactic acidosis 12/15/2021   Leukocytosis 09/13/2021   Osteomyelitis (HCC)    Sacral pressure injury of skin 07/27/2021   Septic shock (HCC) 07/22/2021   Severe sepsis with acute organ dysfunction (HCC) 07/22/2021   Shortness of breath 05/16/2021   Stage 3a chronic kidney disease (HCC) 09/25/2019   Systolic dysfunction    Type 2 diabetes mellitus 10/12/2019    Family History  Problem Relation Age of Onset   Anxiety disorder Mother    Cancer Father    Coronary artery disease Father     Past Surgical History:  Procedure Laterality Date   AMPUTATION Left 07/23/2021   Procedure: LEFT BELOW KNEE AMPUTATION;  Surgeon: Nadara Mustard, MD;  Location: [facility] OR;  Service: Orthopedics;  Laterality: Left;    BELOW KNEE LEG AMPUTATION Right    BUBBLE STUDY  07/29/2021   Procedure: BUBBLE STUDY;  Surgeon: Quintella Reichert, MD;  Location: [facility] ENDOSCOPY;  Service: Cardiovascular;;   CARDIOVERSION N/A 07/29/2021   Procedure: CARDIOVERSION;  Surgeon: Quintella Reichert, MD;  Location: [facility] ENDOSCOPY;  Service: Cardiovascular;  Laterality: N/A;   RADIOLOGY WITH ANESTHESIA N/A 08/05/2021   Procedure: MRI LUMBAR WITH AND WITHOUT; THORASIC SPINE WITH AND WITHOUT WITH ANESTHESIA;  Surgeon: Radiologist, Medication, MD;  Location: [facility] OR;  Service: Radiology;  Laterality: N/A;   RADIOLOGY WITH ANESTHESIA N/A 08/07/2021   Procedure: MRI WITH LUMBER WITH AND WITHOUT CONTRAST,THORACIC  WITH AND WITHOUT CONTRAST;  Surgeon: Radiologist, Medication, MD;  Location: [facility] OR;  Service: Radiology;  Laterality: N/A;   RADIOLOGY WITH ANESTHESIA N/A 09/13/2021   Procedure: MRI WITH ANESTHESIA;  Surgeon: Julieanne Cotton, MD;  Location: [facility] OR;  Service: Radiology;  Laterality: N/A;   TEE WITHOUT CARDIOVERSION N/A 07/29/2021   Procedure: TRANSESOPHAGEAL ECHOCARDIOGRAM (TEE);  Surgeon: Quintella Reichert, MD;  Location: [facility] ENDOSCOPY;  Service: Cardiovascular;  Laterality: N/A;   Social History   Occupational History   Not on file  Tobacco Use   Smoking status: Never   Smokeless tobacco: Current    Types: Chew  Vaping Use   Vaping status: Never Used  Substance and Sexual Activity   Alcohol use: Not Currently   Drug use: Never   Sexual activity: Not Currently

## 2023-07-13 ENCOUNTER — Inpatient Hospital Stay: Payer: Medicare HMO | Admitting: Oncology

## 2023-07-16 ENCOUNTER — Other Ambulatory Visit: Payer: Self-pay

## 2023-07-16 DIAGNOSIS — Z89512 Acquired absence of left leg below knee: Secondary | ICD-10-CM

## 2023-07-16 DIAGNOSIS — Z993 Dependence on wheelchair: Secondary | ICD-10-CM

## 2023-07-16 DIAGNOSIS — Z89511 Acquired absence of right leg below knee: Secondary | ICD-10-CM

## 2023-07-16 DIAGNOSIS — I5032 Chronic diastolic (congestive) heart failure: Secondary | ICD-10-CM

## 2023-07-16 DIAGNOSIS — Z0189 Encounter for other specified special examinations: Secondary | ICD-10-CM

## 2023-07-16 DIAGNOSIS — G894 Chronic pain syndrome: Secondary | ICD-10-CM

## 2023-07-16 DIAGNOSIS — M19019 Primary osteoarthritis, unspecified shoulder: Secondary | ICD-10-CM

## 2023-07-16 DIAGNOSIS — G8929 Other chronic pain: Secondary | ICD-10-CM

## 2023-07-16 DIAGNOSIS — Z7689 Persons encountering health services in other specified circumstances: Secondary | ICD-10-CM

## 2023-07-16 DIAGNOSIS — M47816 Spondylosis without myelopathy or radiculopathy, lumbar region: Secondary | ICD-10-CM

## 2023-07-16 DIAGNOSIS — Z515 Encounter for palliative care: Secondary | ICD-10-CM

## 2023-07-16 DIAGNOSIS — L89154 Pressure ulcer of sacral region, stage 4: Secondary | ICD-10-CM

## 2023-07-16 DIAGNOSIS — M5136 Other intervertebral disc degeneration, lumbar region with discogenic back pain only: Secondary | ICD-10-CM

## 2023-07-19 LAB — HM DIABETES EYE EXAM

## 2023-07-26 ENCOUNTER — Other Ambulatory Visit: Payer: Self-pay | Admitting: Physician Assistant

## 2023-07-26 DIAGNOSIS — T8789 Other complications of amputation stump: Secondary | ICD-10-CM

## 2023-07-27 ENCOUNTER — Inpatient Hospital Stay: Payer: Medicare HMO | Attending: Oncology | Admitting: Oncology

## 2023-07-27 ENCOUNTER — Encounter: Payer: Self-pay | Admitting: Oncology

## 2023-07-27 ENCOUNTER — Inpatient Hospital Stay: Payer: Medicare HMO

## 2023-07-27 ENCOUNTER — Encounter: Payer: Medicare HMO | Admitting: Pharmacist

## 2023-07-27 VITALS — BP 122/81 | HR 88 | Temp 98.3°F | Resp 18

## 2023-07-27 DIAGNOSIS — N2889 Other specified disorders of kidney and ureter: Secondary | ICD-10-CM

## 2023-07-27 DIAGNOSIS — Z23 Encounter for immunization: Secondary | ICD-10-CM | POA: Diagnosis not present

## 2023-07-27 DIAGNOSIS — D751 Secondary polycythemia: Secondary | ICD-10-CM

## 2023-07-27 DIAGNOSIS — E291 Testicular hypofunction: Secondary | ICD-10-CM | POA: Diagnosis not present

## 2023-07-27 DIAGNOSIS — I509 Heart failure, unspecified: Secondary | ICD-10-CM | POA: Insufficient documentation

## 2023-07-27 DIAGNOSIS — D750 Familial erythrocytosis: Secondary | ICD-10-CM | POA: Diagnosis not present

## 2023-07-27 DIAGNOSIS — N289 Disorder of kidney and ureter, unspecified: Secondary | ICD-10-CM

## 2023-07-27 LAB — CBC WITH DIFFERENTIAL/PLATELET
Abs Immature Granulocytes: 0.04 10*3/uL (ref 0.00–0.07)
Basophils Absolute: 0 10*3/uL (ref 0.0–0.1)
Basophils Relative: 0 %
Eosinophils Absolute: 0.1 10*3/uL (ref 0.0–0.5)
Eosinophils Relative: 2 %
HCT: 55 % — ABNORMAL HIGH (ref 39.0–52.0)
Hemoglobin: 18.5 g/dL — ABNORMAL HIGH (ref 13.0–17.0)
Immature Granulocytes: 1 %
Lymphocytes Relative: 12 %
Lymphs Abs: 0.9 10*3/uL (ref 0.7–4.0)
MCH: 29.8 pg (ref 26.0–34.0)
MCHC: 33.6 g/dL (ref 30.0–36.0)
MCV: 88.7 fL (ref 80.0–100.0)
Monocytes Absolute: 0.6 10*3/uL (ref 0.1–1.0)
Monocytes Relative: 8 %
Neutro Abs: 5.7 10*3/uL (ref 1.7–7.7)
Neutrophils Relative %: 77 %
Platelets: 201 10*3/uL (ref 150–400)
RBC: 6.2 MIL/uL — ABNORMAL HIGH (ref 4.22–5.81)
RDW: 13.8 % (ref 11.5–15.5)
WBC: 7.4 10*3/uL (ref 4.0–10.5)
nRBC: 0 % (ref 0.0–0.2)

## 2023-07-27 LAB — IRON AND TIBC
Iron: 62 ug/dL (ref 45–182)
Saturation Ratios: 15 % — ABNORMAL LOW (ref 17.9–39.5)
TIBC: 405 ug/dL (ref 250–450)
UIBC: 343 ug/dL

## 2023-07-27 LAB — COMPREHENSIVE METABOLIC PANEL
ALT: 45 U/L — ABNORMAL HIGH (ref 0–44)
AST: 32 U/L (ref 15–41)
Albumin: 4 g/dL (ref 3.5–5.0)
Alkaline Phosphatase: 85 U/L (ref 38–126)
Anion gap: 5 (ref 5–15)
BUN: 18 mg/dL (ref 6–20)
CO2: 30 mmol/L (ref 22–32)
Calcium: 9.4 mg/dL (ref 8.9–10.3)
Chloride: 104 mmol/L (ref 98–111)
Creatinine, Ser: 1.19 mg/dL (ref 0.61–1.24)
GFR, Estimated: >60 mL/min (ref >60)
Glucose, Bld: 100 mg/dL — ABNORMAL HIGH (ref 70–99)
Potassium: 4.2 mmol/L (ref 3.5–5.1)
Sodium: 139 mmol/L (ref 135–145)
Total Bilirubin: 0.6 mg/dL (ref <1.2)
Total Protein: 6.7 g/dL (ref 6.5–8.1)

## 2023-07-27 LAB — FERRITIN: Ferritin: 25 ng/mL (ref 24–336)

## 2023-07-27 LAB — LACTATE DEHYDROGENASE: LDH: 166 U/L (ref 98–192)

## 2023-07-27 MED ORDER — INFLUENZA VIRUS VACC SPLIT PF (FLUZONE) 0.5 ML IM SUSY
0.5000 mL | PREFILLED_SYRINGE | Freq: Once | INTRAMUSCULAR | Status: AC
  Administered 2023-07-27: 0.5 mL via INTRAMUSCULAR
  Filled 2023-07-27: qty 0.5

## 2023-07-27 NOTE — Assessment & Plan Note (Signed)
-  Elevated hemoglobin and hematocrit noted on multiple occasions since 2023 at least. Discussed potential causes including primary polycythemia/MPN, secondary causes including stressors such as smoking, sleep apnea, certain medications, and low oxygen environments. Patient has a history of long-term testosterone therapy which could contribute. Also noted a tumor on the left kidney which could potentially produce erythropoietin, stimulating red cell production.  -Labs today showed hematocrit of 55, hemoglobin 18.5.  White count normal with normal differential.  Platelet count normal.  CMP grossly unremarkable.  LDH within normal limits.  -We will check erythropoietin level, given his history of tumor on left kidney.  -We will also check JAK2 mutation testing with reflex testing to rule out any evidence of primary polycythemia.  -Most likely etiology for polycythemia is his testosterone supplementation, although patient does not believe that this could be the case for him since he has been on testosterone injections for more than 10 years.  -Consider therapeutic phlebotomy if hematocrit remains above 55, to avoid complications from hyperviscosity including blood clots, cerebrovascular or cardiovascular events. -Patient states that he has pressure ulcer on his sacral area and does not want to come for phlebotomies, unless absolutely necessary.  -Hematocrit is 55 today.  We will defer phlebotomy at this time.  I tried calling patient but there was no answer.

## 2023-07-27 NOTE — Assessment & Plan Note (Signed)
Long-term use of testosterone injections, currently 1cc every two weeks. Previously was on a higher dose of 3cc per week. -Patient continues testosterone cypionate despite risks, as discussed by his PCP.  Dr. Jon Billings is planning on cutting down on testosterone dose. -PSA monitoring as per urology.

## 2023-07-27 NOTE — Assessment & Plan Note (Signed)
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 Concern for RCC but patient declines surgery. Bosniak 3 -Follows with Dr. Marlou Porch -Continue close monitoring with urology.

## 2023-07-27 NOTE — Progress Notes (Signed)
[facility]   HEMATOLOGY CLINIC CONSULTATION NOTE    PATIENT NAME: [name]   MR#: [id number] DOB: 05/23/1966  DATE OF SERVICE: 07/27/2023   REFERRING PHYSICIAN  Lula Olszewski, MD  Patient Care Team: Lula Olszewski, MD as PCP - General (Internal Medicine) Thurmon Fair, MD as PCP - Cardiology (Cardiology) Sheran Luz, MD as Consulting Physician (Physical Medicine and Rehabilitation) Crist Fat, MD as Attending Physician (Urology) Duanne Guess, MD as Consulting Physician (General Surgery) [facility] (Orthopedic Surgery)    REASON FOR CONSULTATION/ CHIEF COMPLAINT: Erythrocytosis  HISTORY OF PRESENTING ILLNESS:   [name] is a 57 y.o. gentleman with past medical history of insulin-dependent diabetes mellitus, with diabetic neuropathy, congestive heart failure, male hypogonadism (on testosterone supplementation), hypertension, kidney stones, tumor on left kidney being monitored by urologist, history of bilateral BKA, was referred to our clinic for evaluation of elevated hemoglobin.  Discussed the use of AI scribe software for clinical note transcription with the patient, who gave verbal consent to proceed.   On 06/24/2023, labs at his PCPs office showed hemoglobin of 18.8, hematocrit of 56.9.  White count and platelet count were within normal limits.  Previously labs in April 2024 showed hemoglobin of 19.4, hematocrit of 56.6.  Given persistent erythrocytosis, referral was sent to [place] for further evaluation.  The patient's hemoglobin has been slightly high since 2023, with a notable increase in the past year. The patient denies any history of blood clots, chest pain, trouble breathing, dizziness, or lightheadedness. The patient has not been diagnosed with sleep apnea. The patient chews tobacco but does not smoke. The patient also has a history of a stage 4 bed sore, which took 18 months to heal and still causes discomfort. The  patient is scheduled for an MRI for a suspected issue with the right leg.   MEDICAL HISTORY:  Past Medical History:  Diagnosis Date   Acquired complex renal cyst 09/25/2019   Acute respiratory failure with hypoxia (HCC) secondary to suspected aspiration pneumonia 12/15/2021   AKI (acute kidney injury) (HCC) 07/22/2021   Cancer (HCC)    renal   Chest pain 07/22/2021   Complication of anesthesia    Decubitus ulcer of sacral area 12/15/2021   Depression    Diabetes mellitus without complication (HCC)    Diabetic infection of left foot (HCC) 04/17/2021   Episode of unresponsiveness 12/15/2021   Gas gangrene of foot (HCC) 07/22/2021   Healthcare maintenance 09/25/2019   Hematuria 09/25/2019   History of kidney stones    Lactic acidosis 12/15/2021   Leukocytosis 09/13/2021   Osteomyelitis (HCC)    Sacral pressure injury of skin 07/27/2021   Septic shock (HCC) 07/22/2021   Severe sepsis with acute organ dysfunction (HCC) 07/22/2021   Shortness of breath 05/16/2021   Stage 3a chronic kidney disease (HCC) 09/25/2019   Systolic dysfunction    Type 2 diabetes mellitus 10/12/2019    SURGICAL HISTORY: Past Surgical History:  Procedure Laterality Date   AMPUTATION Left 07/23/2021   Procedure: LEFT BELOW KNEE AMPUTATION;  Surgeon: Nadara Mustard, MD;  Location: [facility] OR;  Service: Orthopedics;  Laterality: Left;   BELOW KNEE LEG AMPUTATION Right    BUBBLE STUDY  07/29/2021   Procedure: BUBBLE STUDY;  Surgeon: Quintella Reichert, MD;  Location: [facility] ENDOSCOPY;  Service: Cardiovascular;;   CARDIOVERSION N/A 07/29/2021   Procedure: CARDIOVERSION;  Surgeon: Quintella Reichert, MD;  Location: [facility] ENDOSCOPY;  Service: Cardiovascular;  Laterality: N/A;   RADIOLOGY WITH  ANESTHESIA N/A 08/05/2021   Procedure: MRI LUMBAR WITH AND WITHOUT; THORASIC SPINE WITH AND WITHOUT WITH ANESTHESIA;  Surgeon: Radiologist, Medication, MD;  Location: [facility] OR;  Service: Radiology;  Laterality: N/A;   RADIOLOGY WITH  ANESTHESIA N/A 08/07/2021   Procedure: MRI WITH LUMBER WITH AND WITHOUT CONTRAST,THORACIC WITH AND WITHOUT CONTRAST;  Surgeon: Radiologist, Medication, MD;  Location: [facility] OR;  Service: Radiology;  Laterality: N/A;   RADIOLOGY WITH ANESTHESIA N/A 09/13/2021   Procedure: MRI WITH ANESTHESIA;  Surgeon: Julieanne Cotton, MD;  Location: [facility] OR;  Service: Radiology;  Laterality: N/A;   TEE WITHOUT CARDIOVERSION N/A 07/29/2021   Procedure: TRANSESOPHAGEAL ECHOCARDIOGRAM (TEE);  Surgeon: Quintella Reichert, MD;  Location: [facility] ENDOSCOPY;  Service: Cardiovascular;  Laterality: N/A;    SOCIAL HISTORY: Social History   Socioeconomic History   Marital status: Divorced    Spouse name: Not on file   Number of children: Not on file   Years of education: Not on file   Highest education level: Bachelor's degree (e.g., BA, AB, BS)  Occupational History   Not on file  Tobacco Use   Smoking status: Never   Smokeless tobacco: Current    Types: Chew  Vaping Use   Vaping status: Never Used  Substance and Sexual Activity   Alcohol use: Not Currently   Drug use: Never   Sexual activity: Not Currently  Other Topics Concern   Not on file  Social History Narrative   Not on file   Social Determinants of Health   Financial Resource Strain: Low Risk  (06/20/2023)   Overall Financial Resource Strain (CARDIA)    Difficulty of Paying Living Expenses: Not hard at all  Food Insecurity: No Food Insecurity (07/27/2023)   Hunger Vital Sign    Worried About Running Out of Food in the Last Year: Never true    Ran Out of Food in the Last Year: Never true  Transportation Needs: No Transportation Needs (07/27/2023)   PRAPARE - Administrator, Civil Service (Medical): No    Lack of Transportation (Non-Medical): No  Physical Activity: Inactive (06/20/2023)   Exercise Vital Sign    Days of Exercise per Week: 0 days    Minutes of Exercise per Session: 0 min  Stress: Stress Concern Present (06/20/2023)    [employer] of Occupational Health - Occupational Stress Questionnaire    Feeling of Stress : Very much  Social Connections: Unknown (06/20/2023)   Social Connection and Isolation Panel [NHANES]    Frequency of Communication with Friends and Family: Once a week    Frequency of Social Gatherings with Friends and Family: Patient declined    Attends Religious Services: Never    Database administrator or Organizations: No    Attends Banker Meetings: Never    Marital Status: Divorced  Catering manager Violence: Not At Risk (07/27/2023)   Humiliation, Afraid, Rape, and Kick questionnaire    Fear of Current or Ex-Partner: No    Emotionally Abused: No    Physically Abused: No    Sexually Abused: No    FAMILY HISTORY: Family History  Problem Relation Age of Onset   Anxiety disorder Mother    Cancer Father    Coronary artery disease Father     CURRENT MEDICATIONS   Current Outpatient Medications  Medication Instructions   ACCU-CHEK GUIDE test strip USE UP TO SIX TIMES DAILY TO CHECK BLOOD SUGARS   aspirin EC 81 mg, Oral, Daily, Swallow whole.   baclofen (LIORESAL)  10 mg, Oral, 3 times daily   blood glucose meter kit and supplies KIT Dispense based on patient and insurance preference. Use up to four times daily as directed.   furosemide (LASIX) 20 MG tablet TAKE 1 TABLET BY MOUTH ONCE DAILY . APPOINTMENT REQUIRED FOR FUTURE REFILLS   insulin NPH-regular Human (NOVOLIN 70/30 RELION) (70-30) 100 UNIT/ML injection 15 Units, Subcutaneous, 2 times daily with meals   lisinopril (ZESTRIL) 10 mg, Oral, Daily, KEEP JAN. OV.   Oxycodone HCl 10 mg, Oral, 3 times daily PRN   pregabalin (LYRICA) 75 mg, Oral, 2 times daily   rosuvastatin (CRESTOR) 20 mg, Oral, Daily   sildenafil (VIAGRA) 100 mg, Oral, Daily PRN   tamsulosin (FLOMAX) 0.4 mg, Oral, Daily   testosterone cypionate (DEPO-TESTOSTERONE) 200 mg, Intramuscular, Every 14 days, Significant Risks of heart attack, stroke with  continued use. Reducing dose as tolerated highly recommended     ALLERGIES  He is allergic to bactrim [sulfamethoxazole-trimethoprim], ciprofloxacin, and levaquin [levofloxacin].  REVIEW OF SYSTEMS:    Review of Systems - Oncology  All of the other pertinent systems were reviewed with the patient and were unremarkable except as mentioned above in HPI.  PHYSICAL EXAMINATION:  ECOG PERFORMANCE STATUS: 1 - Symptomatic but completely ambulatory  Vitals:   07/27/23 1309  BP: 122/81  Pulse: 88  Resp: 18  Temp: 98.3 F (36.8 C)  SpO2: 98%    Physical Exam Constitutional:      General: He is not in acute distress.    Appearance: Normal appearance.  HENT:     Head: Normocephalic and atraumatic.  Eyes:     General: No scleral icterus.    Conjunctiva/sclera: Conjunctivae normal.  Cardiovascular:     Rate and Rhythm: Normal rate and regular rhythm.     Heart sounds: Normal heart sounds.  Pulmonary:     Effort: Pulmonary effort is normal.     Breath sounds: Normal breath sounds.  Abdominal:     General: There is no distension.  Musculoskeletal:     Comments: S/p bilateral BKA  Lymphadenopathy:     Cervical: No cervical adenopathy.  Neurological:     General: No focal deficit present.     Mental Status: He is alert and oriented to person, place, and time.  Psychiatric:        Mood and Affect: Mood normal.        Behavior: Behavior normal.        Thought Content: Thought content normal.     LABORATORY DATA:   I have reviewed the data as listed.  Results for orders placed or performed in visit on 07/27/23  Lactate dehydrogenase  Result Value Ref Range   LDH 166 98 - 192 U/L  Comprehensive metabolic panel  Result Value Ref Range   Sodium 139 135 - 145 mmol/L   Potassium 4.2 3.5 - 5.1 mmol/L   Chloride 104 98 - 111 mmol/L   CO2 30 22 - 32 mmol/L   Glucose, Bld 100 (H) 70 - 99 mg/dL   BUN 18 6 - 20 mg/dL   Creatinine, Ser 1.61 0.61 - 1.24 mg/dL   Calcium 9.4 8.9  - 09.6 mg/dL   Total Protein 6.7 6.5 - 8.1 g/dL   Albumin 4.0 3.5 - 5.0 g/dL   AST 32 15 - 41 U/L   ALT 45 (H) 0 - 44 U/L   Alkaline Phosphatase 85 38 - 126 U/L   Total Bilirubin 0.6 <1.2 mg/dL   GFR, Estimated >  60 >60 mL/min   Anion gap 5 5 - 15  CBC with Differential/Platelet  Result Value Ref Range   WBC 7.4 4.0 - 10.5 K/uL   RBC 6.20 (H) 4.22 - 5.81 MIL/uL   Hemoglobin 18.5 (H) 13.0 - 17.0 g/dL   HCT 95.6 (H) 21.3 - 08.6 %   MCV 88.7 80.0 - 100.0 fL   MCH 29.8 26.0 - 34.0 pg   MCHC 33.6 30.0 - 36.0 g/dL   RDW 57.8 46.9 - 62.9 %   Platelets 201 150 - 400 K/uL   nRBC 0.0 0.0 - 0.2 %   Neutrophils Relative % 77 %   Neutro Abs 5.7 1.7 - 7.7 K/uL   Lymphocytes Relative 12 %   Lymphs Abs 0.9 0.7 - 4.0 K/uL   Monocytes Relative 8 %   Monocytes Absolute 0.6 0.1 - 1.0 K/uL   Eosinophils Relative 2 %   Eosinophils Absolute 0.1 0.0 - 0.5 K/uL   Basophils Relative 0 %   Basophils Absolute 0.0 0.0 - 0.1 K/uL   Immature Granulocytes 1 %   Abs Immature Granulocytes 0.04 0.00 - 0.07 K/uL      RADIOGRAPHIC STUDIES:  No recent pertinent imaging studies available to review.  ASSESSMENT & PLAN:  57 y.o. gentleman with past medical history of insulin-dependent diabetes mellitus, with diabetic neuropathy, congestive heart failure, male hypogonadism (on testosterone supplementation), hypertension, kidney stones, tumor on left kidney being monitored by urologist, history of bilateral BKA, was referred to our clinic for evaluation of elevated hemoglobin.  Polycythemia -Elevated hemoglobin and hematocrit noted on multiple occasions since 2023 at least. Discussed potential causes including primary polycythemia/MPN, secondary causes including stressors such as smoking, sleep apnea, certain medications, and low oxygen environments. Patient has a history of long-term testosterone therapy which could contribute. Also noted a tumor on the left kidney which could potentially produce erythropoietin,  stimulating red cell production.  -Labs today showed hematocrit of 55, hemoglobin 18.5.  White count normal with normal differential.  Platelet count normal.  CMP grossly unremarkable.  LDH within normal limits.  -We will check erythropoietin level, given his history of tumor on left kidney.  -We will also check JAK2 mutation testing with reflex testing to rule out any evidence of primary polycythemia.  -Most likely etiology for polycythemia is his testosterone supplementation, although patient does not believe that this could be the case for him since he has been on testosterone injections for more than 10 years.  -Consider therapeutic phlebotomy if hematocrit remains above 55, to avoid complications from hyperviscosity including blood clots, cerebrovascular or cardiovascular events. -Patient states that he has pressure ulcer on his sacral area and does not want to come for phlebotomies, unless absolutely necessary.  -Hematocrit is 55 today.  We will defer phlebotomy at this time.  I tried calling patient but there was no answer.   Renal mass Tumor on left kidney, currently being monitored by urology. Concern for RCC but patient declines surgery. Bosniak 3 -Follows with Dr. Marlou Porch -Continue close monitoring with urology.  Hypogonadism in male Long-term use of testosterone injections, currently 1cc every two weeks. Previously was on a higher dose of 3cc per week. -Patient continues testosterone cypionate despite risks, as discussed by his PCP.  Dr. Jon Billings is planning on cutting down on testosterone dose. -PSA monitoring as per urology.  RTC in 3 months for labs, follow-up.   Orders Placed This Encounter  Procedures   CBC with Differential/Platelet    Standing Status:  Future    Number of Occurrences:   1    Standing Expiration Date:   07/26/2024   Comprehensive metabolic panel    Standing Status:   Future    Number of Occurrences:   1    Standing Expiration Date:    07/26/2024   Ferritin    Standing Status:   Future    Number of Occurrences:   1    Standing Expiration Date:   07/26/2024   Iron and Iron Binding Capacity ([facility] only)    Standing Status:   Future    Number of Occurrences:   1    Standing Expiration Date:   07/26/2024   Lactate dehydrogenase    Standing Status:   Future    Number of Occurrences:   1    Standing Expiration Date:   07/26/2024   Erythropoietin    Standing Status:   Future    Number of Occurrences:   1    Standing Expiration Date:   07/26/2024   JAK2 (including V617F and Exon 12), MPL, and CALR-Next Generation Sequencing    Standing Status:   Future    Number of Occurrences:   1    Standing Expiration Date:   07/26/2024    The total time spent in the appointment was 55 minutes encounter with the patient, including review of chart and results of various tests, discussion about plan of care and coordination of care plan.  I reviewed lab results and outside records for this visit and discussed relevant results with the patient. Diagnosis, plan of care and treatment options were also discussed in detail with the patient. Opportunity provided to ask questions and answers provided to his apparent satisfaction. Provided instructions to call our clinic with any problems, questions or concerns prior to return visit. I recommended to continue follow-up with PCP and sub-specialists. He verbalized understanding and agreed with the plan. No barriers to learning was detected.   Future Appointments  Date Time Provider Department Center  07/28/2023  1:20 PM Lula Olszewski, MD [facility]  08/10/2023  6:00 PM [facility] MR 1 [id number] [address] [facility]  09/20/2023  3:10 PM Sharlene Dory, PA-C [facility]  10/26/2023  2:45 PM [facility] PHLEBOTOMIST [facility] None  10/26/2023  3:00 PM Aryan Sparks, Archie Patten, MD [facility] None  03/27/2024  3:00 PM [facility] ANNUAL WELLNESS VISIT 2 [facility]     Meryl Crutch, MD  07/27/2023 3:25 PM   [facility] - [facility] OF [facility] [address] [place] [address] Dept: [phone] Dept Fax: [phone]    This document was completed utilizing speech recognition software. Grammatical errors, random word insertions, pronoun errors, and incomplete sentences are an occasional consequence of this system due to software limitations, ambient noise, and hardware issues. Any formal questions or concerns about the content, text or information contained within the body of this dictation should be directly addressed to the provider for clarification.

## 2023-07-28 ENCOUNTER — Encounter: Payer: Self-pay | Admitting: Internal Medicine

## 2023-07-28 ENCOUNTER — Ambulatory Visit: Payer: Medicare HMO | Admitting: Internal Medicine

## 2023-07-28 VITALS — BP 158/98 | HR 98 | Temp 97.9°F | Ht 75.0 in

## 2023-07-28 DIAGNOSIS — E1042 Type 1 diabetes mellitus with diabetic polyneuropathy: Secondary | ICD-10-CM

## 2023-07-28 DIAGNOSIS — Z789 Other specified health status: Secondary | ICD-10-CM | POA: Diagnosis not present

## 2023-07-28 DIAGNOSIS — N182 Chronic kidney disease, stage 2 (mild): Secondary | ICD-10-CM

## 2023-07-28 DIAGNOSIS — T8789 Other complications of amputation stump: Secondary | ICD-10-CM | POA: Diagnosis not present

## 2023-07-28 DIAGNOSIS — M549 Dorsalgia, unspecified: Secondary | ICD-10-CM

## 2023-07-28 DIAGNOSIS — Z8051 Family history of malignant neoplasm of kidney: Secondary | ICD-10-CM | POA: Diagnosis not present

## 2023-07-28 DIAGNOSIS — M19019 Primary osteoarthritis, unspecified shoulder: Secondary | ICD-10-CM | POA: Insufficient documentation

## 2023-07-28 DIAGNOSIS — D751 Secondary polycythemia: Secondary | ICD-10-CM | POA: Diagnosis not present

## 2023-07-28 DIAGNOSIS — G4733 Obstructive sleep apnea (adult) (pediatric): Secondary | ICD-10-CM

## 2023-07-28 DIAGNOSIS — G8929 Other chronic pain: Secondary | ICD-10-CM

## 2023-07-28 DIAGNOSIS — M79609 Pain in unspecified limb: Secondary | ICD-10-CM | POA: Insufficient documentation

## 2023-07-28 LAB — ERYTHROPOIETIN: Erythropoietin: 40.7 m[IU]/mL — ABNORMAL HIGH (ref 2.6–18.5)

## 2023-07-28 MED ORDER — CELECOXIB 100 MG PO CAPS: 100.0000 mg | ORAL_CAPSULE | Freq: Two times a day (BID) | ORAL | 0 refills | Status: DC

## 2023-07-28 MED ORDER — ASPIRIN 325 MG PO TBEC: 325.0000 mg | DELAYED_RELEASE_TABLET | Freq: Every day | ORAL | 3 refills | Status: DC

## 2023-07-28 NOTE — Progress Notes (Signed)
[facility]: [phone]   -- Medical Office Visit --  Patient:  [name]      Age: 57 y.o.       Sex:  male  Date:   07/28/2023 Today's Healthcare Provider: Lula Olszewski, MD  ==========================================================================     Assessment & Plan Polycythemia Polycythemia with Elevated Erythropoietin (D75.1)  Elevated erythropoietin (7) likely secondary to untreated sleep apnea and testosterone therapy High risk for thromboembolic events requiring immediate intervention Plan:  Increase aspirin to 325mg  daily for stroke prevention Initiate CPAP therapy to address underlying sleep apnea Continue hematology follow-up Monitor CBC with differential Consider testosterone dose adjustment based on response to CPAP OSA (obstructive sleep apnea) Obstructive Sleep Apnea (G47.33)  Clinical diagnosis based on symptoms including snoring, witnessed apnea, morning headaches Contributing to elevated erythropoietin and polycythemia Plan:  Prescribe CPAP device with ResMed P10 mask recommended Sleep study ordered for formal diagnosis Documented symptoms to support medical necessity for insurance coverage Patient education provided regarding CPAP use and importance of compliance Chronic back pain, unspecified back location, unspecified back pain laterality Left Shoulder Pain (M25.512) and Chronic Stump Pain  Chronic pain with inflammatory component Limited response to current management including nerve block Plan:  Initiate Celebrex 100mg  BID with careful monitoring due to CKD MRI scheduled December 3rd to evaluate for infection Continue current pain management with oxycodone Follow-up with orthopedics Amputation stump pain (HCC) Left Shoulder Pain (M25.512) and Chronic Stump Pain  Chronic pain with inflammatory component Limited response to current management including nerve block Plan:  Initiate Celebrex 100mg  BID with  careful monitoring due to CKD MRI scheduled December 3rd to evaluate for infection Continue current pain management with oxycodone Follow-up with orthopedics Shoulder arthritis  Type 1 diabetes mellitus with diabetic polyneuropathy, with long-term current use of insulin (HCC) Diabetes Mellitus Type 1 with Type 2 Component (E13.9)  Current HbA1c 8.1, improved from previous 11.9 Self-managing with insulin and CGM Plan:  Continue current insulin regimen Maintain CGM monitoring Recommended endocrinology follow-up Monitor for complications given Stage 2 CKD Difficulty using private transportation Will get social work support, below knee amputation makes transport difficult  Family history of renal cell carcinoma Relevant to bosniak lesion monitoring. Chronic kidney disease, stage 2 (mild) Stage 2 Chronic Kidney Disease (N18.2) Lab Results  Component Value Date/Time   GFR 77.08 06/24/2023 02:11 PM   GFR 68.09 12/23/2022 02:23 PM   GFR 73.47 07/03/2022 01:38 PM   Stable renal function Known Bosniak III lesion with documented size decrease on imaging Plan:  Continue annual imaging surveillance Monitor renal function with restricted NSAID use Maintain diabetes control to prevent progression Patient education regarding fluid intake and medication precautions ED Discharge Orders          Ordered    Ambulatory referral to Sleep Studies        07/28/23 1340    For home use only DME continuous positive airway pressure (CPAP)       Comments: Prefer ResMed airsense 11 with p10 mask and headgear.   07/28/23 1340    celecoxib (CELEBREX) 100 MG capsule  2 times daily        07/28/23 1352    Ambulatory referral to Social Work       Comments: Biggest expense is food.  Also struggling to get electric wheelchair.  Also struggling to get CPAP and other medical DME.   07/28/23 1359    aspirin EC 325 MG tablet  Daily  07/28/23 1402          Diagnoses and all orders for this  visit: Polycythemia -     Ambulatory referral to Sleep Studies -     Ambulatory referral to Social Work -     aspirin EC 325 MG tablet; Take 1 tablet (325 mg total) by mouth daily. Encounter for long-term (current) use of insulin (HCC) -     Ambulatory referral to Social Work Chronic back pain, unspecified back location, unspecified back pain laterality -     Ambulatory referral to Social Work Amputation stump pain (HCC) -     celecoxib (CELEBREX) 100 MG capsule; Take 1 capsule (100 mg total) by mouth 2 (two) times daily. -     Ambulatory referral to Social Work Shoulder arthritis -     celecoxib (CELEBREX) 100 MG capsule; Take 1 capsule (100 mg total) by mouth 2 (two) times daily. -     Ambulatory referral to Social Work Type 1 diabetes mellitus with diabetic polyneuropathy, with long-term current use of insulin (HCC) -     Ambulatory referral to Social Work OSA (obstructive sleep apnea) -     Ambulatory referral to Sleep Studies -     For home use only DME continuous positive airway pressure (CPAP) -     Ambulatory referral to Social Work Difficulty using private transportation -     Ambulatory referral to Social Work  Recommended follow-up: No follow-ups on file. Future Appointments  Date Time Provider Department Center  08/10/2023  6:00 PM [facility] MR 1 [id number]  09/20/2023  3:10 PM Sharlene Dory, PA-C [facility]  10/26/2023  2:45 PM [facility] PHLEBOTOMIST [facility] None  10/26/2023  3:00 PM Pasam, Archie Patten, MD [facility] None  12/27/2023  1:20 PM Lula Olszewski, MD [facility]  03/27/2024  3:00 PM [facility] ANNUAL WELLNESS VISIT 2 [facility]  Patient Care Team: Lula Olszewski, MD as PCP - General (Internal Medicine) Thurmon Fair, MD as PCP - Cardiology (Cardiology) Sheran Luz, MD as Consulting Physician (Physical Medicine and Rehabilitation) Crist Fat, MD as Attending Physician (Urology) Duanne Guess, MD as Consulting Physician  (General Surgery) [facility] (Orthopedic Surgery)    SUBJECTIVE: 57 y.o. male who has Hypertensive disorder; Urinary hesitancy; Prosthesis adjustments; Erectile dysfunction; Palliative care status; Pulmonary nodules; Depressive disorder; Type 1 diabetes mellitus with diabetic polyneuropathy, with long-term current use of insulin (HCC); S/P bilateral below knee amputation (HCC); Dyslipidemia; Chronic diastolic (congestive) heart failure (HCC); Paroxysmal atrial flutter (HCC); Discitis of thoracic region; Pseudohyponatremia; Normocytic anemia; Insomnia; Chronic systolic CHF (congestive heart failure) (HCC); Hypogonadism in male; Chronic pain; Stage IV pressure ulcer of sacral region [name]); Episode of unresponsiveness; Renal mass; Generalized anxiety disorder; Amputation below knee (HCC); H/O amputation of leg through tibia and fibula (HCC); Degeneration of lumbar intervertebral disc; Hypercholesterolemia; Lumbar spondylosis; PTSD (post-traumatic stress disorder); Malaise; Polycythemia; 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HCC); Chronic back pain; Shoulder arthritis; and Amputation stump pain (HCC) on their problem list.  Main reasons for visit/main concerns/chief complaint: 1 month follow-up    AI-Extracted: Discussed the use of AI scribe software for clinical note transcription with the patient, who gave verbal consent to proceed.  History of Present Illness Patient presents for follow-up of multiple chronic conditions including polycythemia, diabetes mellitus, chronic pain, and sleep apnea. Recent hematology evaluation revealed significantly elevated erythropoietin level of 7, raising concern for increased stroke risk in the setting of polycythemia. Patient currently managed on aspirin therapy. Patient reports ongoing left shoulder  pain with limited relief from current pain management regimen including oxycodone. Previously prescribed Lyrica 150mg  daily which has been effective for phantom limb pain but  provides minimal relief for inflammatory stump pain. A prior nerve block was attempted without significant benefit. Patient expresses frustration regarding delayed MRI scheduling and authorization process for further evaluation of stump pain, currently scheduled for December 3rd. Diabetes is reported as relatively well-controlled with current HbA1c of 8.1, improved from previous value of 11.9. Patient utilizes continuous glucose monitoring and manages insulin independently through [employer]. No acute complaints related to diabetes management. Known renal lesion (Bosniak III) continues to be monitored with annual imaging. Most recent scan demonstrated decrease in lesion size, which is reassuring against malignancy. Patient has mild stage 2 kidney disease with preserved renal function. Sleep apnea is suspected based on clinical presentation and relationship to elevated erythropoietin. Patient endorses classic symptoms including snoring, witnessed apnea, morning headaches, and gasping/choking during sleep. Currently on testosterone therapy which may be exacerbating underlying sleep-disordered breathing.   Note that patient  has a past medical history of Acquired complex renal cyst (09/25/2019), Acute respiratory failure with hypoxia (HCC) secondary to suspected aspiration pneumonia (12/15/2021), AKI (acute kidney injury) (HCC) (07/22/2021), Cancer (HCC), Chest pain (07/22/2021), Complication of anesthesia, Decubitus ulcer of sacral area (12/15/2021), Depression, Diabetes mellitus without complication (HCC), Diabetic infection of left foot (HCC) (04/17/2021), Episode of unresponsiveness (12/15/2021), Gas gangrene of foot (HCC) (07/22/2021), Healthcare maintenance (09/25/2019), Hematuria (09/25/2019), History of kidney stones, Lactic acidosis (12/15/2021), Leukocytosis (09/13/2021), Osteomyelitis (HCC), Sacral pressure injury of skin (07/27/2021), Septic shock (HCC) (07/22/2021), Severe sepsis with acute  organ dysfunction (HCC) (07/22/2021), Shortness of breath (05/16/2021), Stage 3a chronic kidney disease (HCC) (09/25/2019), Systolic dysfunction, and Type 2 diabetes mellitus (10/12/2019).  Problem list overviews that were updated at today's visit: Problem  Shoulder Arthritis  Amputation Stump Pain (Hcc)    Med reconciliation: Current Outpatient Medications on File Prior to Visit  Medication Sig   ACCU-CHEK GUIDE test strip USE UP TO SIX TIMES DAILY TO CHECK BLOOD SUGARS   baclofen (LIORESAL) 10 MG tablet TAKE 1 TABLET BY MOUTH THREE TIMES DAILY   blood glucose meter kit and supplies KIT Dispense based on patient and insurance preference. Use up to four times daily as directed.   furosemide (LASIX) 20 MG tablet TAKE 1 TABLET BY MOUTH ONCE DAILY . APPOINTMENT REQUIRED FOR FUTURE REFILLS   insulin NPH-regular Human (NOVOLIN 70/30 RELION) (70-30) 100 UNIT/ML injection Inject 15 Units into the skin 2 (two) times daily with a meal. (Patient taking differently: Inject 3-12 Units into the skin 2 (two) times daily with a meal. Sliding scale)   lisinopril (ZESTRIL) 10 MG tablet Take 1 tablet (10 mg total) by mouth daily. KEEP JAN. OV.   Oxycodone HCl 10 MG TABS Take 10 mg by mouth 3 (three) times daily as needed (Pain).   pregabalin (LYRICA) 75 MG capsule Take 75 mg by mouth 2 (two) times daily.   rosuvastatin (CRESTOR) 20 MG tablet Take 1 tablet by mouth once daily   sildenafil (VIAGRA) 100 MG tablet Take 100 mg by mouth daily as needed.   tamsulosin (FLOMAX) 0.4 MG CAPS capsule Take 0.4 mg by mouth daily.   testosterone cypionate (DEPO-TESTOSTERONE) 200 MG/ML injection Inject 1 mL (200 mg total) into the muscle every 14 (fourteen) days. Significant Risks of heart attack, stroke with continued use. Reducing dose as tolerated highly recommended   No current facility-administered medications on file prior to visit.   Medications Discontinued  During This Encounter  Medication Reason   aspirin EC 81  MG tablet      Objective   Physical Exam     07/28/2023    1:31 PM 07/28/2023    1:20 PM 07/27/2023    1:09 PM  Vitals with BMI  Height  6\' 3"    Weight  --   Systolic 158 150 829  Diastolic 98 98 81  Pulse  98 88   Wt Readings from Last 10 Encounters:  03/22/23 202 lb (91.6 kg)  06/15/22 202 lb (91.6 kg)  01/21/22 208 lb (94.3 kg)  12/31/21 208 lb (94.3 kg)  09/14/21 240 lb 4.8 oz (109 kg)  08/01/21 235 lb 14.3 oz (107 kg)  05/16/21 240 lb (108.9 kg)  04/24/21 238 lb (108 kg)  04/05/20 (!) 214 lb (97.1 kg)  04/04/20 (!) 229 lb (103.9 kg)   Vital signs reviewed.  Nursing notes reviewed. Weight trend reviewed. Abnormalities and Problem-Specific physical exam findings:  wheelchair  General Appearance:  No acute distress appreciable.   Well-groomed, healthy-appearing male.  Well proportioned with no abnormal fat distribution.  Good muscle tone. Pulmonary:  Normal work of breathing at rest, no respiratory distress apparent. SpO2: 94 %  Musculoskeletal: Below knee amputation of left lower extremity is noted. Below knee amputation of right lower extremity is noted. There is inflammation of the stump especially right sided Neurological:  Awake, alert, oriented, and engaged.  No obvious focal neurological deficits or cognitive impairments.  Sensorium seems unclouded.   Speech is clear and coherent with logical content. Psychiatric:  Appropriate mood, pleasant and cooperative demeanor, thoughtful and engaged during the exam  Results   LABS Erythropoietin: 7 mU/mL (07/27/2023)        No results found for any visits on 07/28/23.  Appointment on 07/27/2023  Component Date Value   Erythropoietin 07/27/2023 40.7 (H)    LDH 07/27/2023 166    Ferritin 07/27/2023 25    Sodium 07/27/2023 139    Potassium 07/27/2023 4.2    Chloride 07/27/2023 104    CO2 07/27/2023 30    Glucose, Bld 07/27/2023 100 (H)    BUN 07/27/2023 18    Creatinine, Ser 07/27/2023 1.19    Calcium 07/27/2023  9.4    Total Protein 07/27/2023 6.7    Albumin 07/27/2023 4.0    AST 07/27/2023 32    ALT 07/27/2023 45 (H)    Alkaline Phosphatase 07/27/2023 85    Total Bilirubin 07/27/2023 0.6    GFR, Estimated 07/27/2023 >60    Anion gap 07/27/2023 5    WBC 07/27/2023 7.4    RBC 07/27/2023 6.20 (H)    Hemoglobin 07/27/2023 18.5 (H)    HCT 07/27/2023 55.0 (H)    MCV 07/27/2023 88.7    MCH 07/27/2023 29.8    MCHC 07/27/2023 33.6    RDW 07/27/2023 13.8    Platelets 07/27/2023 201    nRBC 07/27/2023 0.0    Neutrophils Relative % 07/27/2023 77    Neutro Abs 07/27/2023 5.7    Lymphocytes Relative 07/27/2023 12    Lymphs Abs 07/27/2023 0.9    Monocytes Relative 07/27/2023 8    Monocytes Absolute 07/27/2023 0.6    Eosinophils Relative 07/27/2023 2    Eosinophils Absolute 07/27/2023 0.1    Basophils Relative 07/27/2023 0    Basophils Absolute 07/27/2023 0.0    Immature Granulocytes 07/27/2023 1    Abs Immature Granulocytes 07/27/2023 0.04   Infusion on 07/27/2023  Component Date Value   Iron  07/27/2023 62    TIBC 07/27/2023 405    Saturation Ratios 07/27/2023 15 (L)    UIBC 07/27/2023 343   Office Visit on 06/24/2023  Component Date Value   Sodium 06/24/2023 136    Potassium 06/24/2023 4.5    Chloride 06/24/2023 103    CO2 06/24/2023 23    Glucose, Bld 06/24/2023 235 (H)    BUN 06/24/2023 18    Creatinine, Ser 06/24/2023 1.07    Total Bilirubin 06/24/2023 0.5    Alkaline Phosphatase 06/24/2023 95    AST 06/24/2023 33    ALT 06/24/2023 39    Total Protein 06/24/2023 6.9    Albumin 06/24/2023 4.0    GFR 06/24/2023 77.08    Calcium 06/24/2023 9.4    WBC 06/24/2023 6.6    RBC 06/24/2023 6.14 (H)    Hemoglobin 06/24/2023 18.8 Repeated and verified X2. (HH)    HCT 06/24/2023 56.9 (H)    MCV 06/24/2023 92.7    MCHC 06/24/2023 33.0    RDW 06/24/2023 14.2    Platelets 06/24/2023 210.0    Neutrophils Relative % 06/24/2023 80.7 (H)    Lymphocytes Relative 06/24/2023 9.0 (L)     Monocytes Relative 06/24/2023 8.3    Eosinophils Relative 06/24/2023 1.6    Basophils Relative 06/24/2023 0.4    Neutro Abs 06/24/2023 5.4    Lymphs Abs 06/24/2023 0.6 (L)    Monocytes Absolute 06/24/2023 0.6    Eosinophils Absolute 06/24/2023 0.1    Basophils Absolute 06/24/2023 0.0    TSH 06/24/2023 2.340    Cholesterol 06/24/2023 105    HDL 06/24/2023 37 (L)    Triglycerides 06/24/2023 127    LDL Cholesterol (Calc) 06/24/2023 47    Total CHOL/HDL Ratio 06/24/2023 2.8    Non-HDL Cholesterol (Cal* 06/24/2023 68    Testosterone 06/24/2023 745.16    Hgb A1c MFr Bld 06/24/2023 8.1 (H)   Office Visit on 12/23/2022  Component Date Value   Testosterone 12/23/2022 853.25    WBC 12/23/2022 6.2    RBC 12/23/2022 6.27 (H)    Hemoglobin 12/23/2022 19.4 Repeated and verified X2. (HH)    HCT 12/23/2022 56.6 Repeated and verified X2. (H)    MCV 12/23/2022 90.3    MCHC 12/23/2022 34.2    RDW 12/23/2022 13.7    Platelets 12/23/2022 193.0    Neutrophils Relative % 12/23/2022 76.9    Lymphocytes Relative 12/23/2022 11.7 (L)    Monocytes Relative 12/23/2022 9.3    Eosinophils Relative 12/23/2022 1.8    Basophils Relative 12/23/2022 0.3    Neutro Abs 12/23/2022 4.7    Lymphs Abs 12/23/2022 0.7    Monocytes Absolute 12/23/2022 0.6    Eosinophils Absolute 12/23/2022 0.1    Basophils Absolute 12/23/2022 0.0    Sodium 12/23/2022 139    Potassium 12/23/2022 4.2    Chloride 12/23/2022 103    CO2 12/23/2022 24    Glucose, Bld 12/23/2022 228 (H)    BUN 12/23/2022 25 (H)    Creatinine, Ser 12/23/2022 1.19    Total Bilirubin 12/23/2022 0.9    Alkaline Phosphatase 12/23/2022 100    AST 12/23/2022 30    ALT 12/23/2022 55 (H)    Total Protein 12/23/2022 6.8    Albumin 12/23/2022 4.2    GFR 12/23/2022 68.09    Calcium 12/23/2022 9.6    Cholesterol 12/23/2022 124    Triglycerides 12/23/2022 141.0    HDL 12/23/2022 45.20    VLDL 12/23/2022 28.2    LDL Cholesterol 12/23/2022 51  Total  CHOL/HDL Ratio 12/23/2022 3    NonHDL 12/23/2022 79.26    Hgb A1c MFr Bld 12/23/2022 7.1 (H)    No image results found.   XR Tibia/Fibula Right  Result Date: 07/07/2023 Radiographs of the right tibia and fibula show transtibial amputation.  There is osteophytic spurring.  There is no acute abnormality.  [place] Extrem Low Right Ltd  Result Date: 05/11/2023 Limited MSK US of the right lower extremity, right proximal tibia and fibula (s/p BKA) was performed today. There is a hypoechoic line overlying the distal lateral fibula with small callous formation that is incompletely visualized - this could reflect artifact from prior BKA or stress-related fracture.The distal fibula was seen in both short and long axis. Medial to the fibula there was the associated neurovasculature, with identification of peroneal (fibular) nerve. Mild soft tissue swelling around the NV with brief cobblestoning but no significant hyperemia or fluid pocket identified within soft tissue. *Technically successful US-guided peroneal nerve hydodissection   No results found.       Additional Info: This encounter employed real-time, collaborative documentation. The patient actively reviewed and updated their medical record on a shared screen, ensuring transparency and facilitating joint problem-solving for the problem list, overview, and plan. This approach promotes accurate, informed care. The treatment plan was discussed and reviewed in detail, including medication safety, potential side effects, and all patient questions. We confirmed understanding and comfort with the plan. Follow-up instructions were established, including contacting the office for any concerns, returning if symptoms worsen, persist, or new symptoms develop, and precautions for potential emergency department visits.

## 2023-07-28 NOTE — Assessment & Plan Note (Signed)
Diabetes Mellitus Type 1 with Type 2 Component (E13.9)  Current HbA1c 8.1, improved from previous 11.9 Self-managing with insulin and CGM Plan:  Continue current insulin regimen Maintain CGM monitoring Recommended endocrinology follow-up Monitor for complications given Stage 2 CKD

## 2023-07-28 NOTE — Assessment & Plan Note (Signed)
Left Shoulder Pain (M25.512) and Chronic Stump Pain  Chronic pain with inflammatory component Limited response to current management including nerve block Plan:  Initiate Celebrex 100mg  BID with careful monitoring due to CKD MRI scheduled December 3rd to evaluate for infection Continue current pain management with oxycodone Follow-up with orthopedics

## 2023-07-28 NOTE — Assessment & Plan Note (Signed)
Polycythemia with Elevated Erythropoietin (D75.1)  Elevated erythropoietin (7) likely secondary to untreated sleep apnea and testosterone therapy High risk for thromboembolic events requiring immediate intervention Plan:  Increase aspirin to 325mg  daily for stroke prevention Initiate CPAP therapy to address underlying sleep apnea Continue hematology follow-up Monitor CBC with differential Consider testosterone dose adjustment based on response to CPAP

## 2023-07-28 NOTE — Patient Instructions (Addendum)
After Visit Summary  Thank you for coming in today. Below is a summary of your visit and important instructions to follow:  Your Health Updates:  1. Blood Condition (Polycythemia):    - Your blood is too thick due to high red blood cell levels    - Start taking aspirin 325mg  daily to prevent stroke    - We will treat the underlying sleep apnea to help fix this problem     2. Sleep Apnea:    - You will be getting a CPAP machine to help you breathe at night    - The recommended mask is the ResMed P10    - Using this consistently will help reduce your stroke risk    - Contact your insurance company about coverage for the CPAP machine  3. Pain Management:    - New prescription for Celebrex 100mg  twice daily    - Take with food and plenty of water    - Stop taking if you experience chest pain    - Continue current pain medications as prescribed    - Your MRI is scheduled for December 3rd - call facility to check for earlier appointments  4. Diabetes Care:    - Continue current insulin management    - Keep using your continuous glucose monitor    - Good job improving your blood sugar control     Medication Changes: - NEW: Aspirin 325mg  daily - NEW: Celebrex 100mg  twice daily - CONTINUE: Other current medications including Lyrica and insulin  Important Warning Signs: Call 911 or go to the hospital immediately if you experience: - Chest pain - Severe headache - Difficulty breathing - Severe nausea/vomiting (can affect your diabetes control)  Next Steps: 1. Pick up new prescriptions 2. Call insurance about CPAP coverage 3. Call MRI facility about earlier appointment 4. Follow up with hematologist as scheduled 5. Return to clinic in 5 months for testosterone check  Your Next Appointment: 5 months from today  Remember: - Drink plenty of water, especially with new medications - Keep managing your diabetes as you have been - Use CPAP every night once received - Report any new  chest pain or concerning symptoms immediately  Questions or Concerns? Contact us at [facility] phone number]  Please follow up in five months for a testosterone therapy evaluation. Your aspirin prescription will be sent to [facility] pharmacy. Monitor for any side effects or complications from the new medications and contact us if you have any concerns.

## 2023-08-10 ENCOUNTER — Ambulatory Visit
Admission: RE | Admit: 2023-08-10 | Discharge: 2023-08-10 | Disposition: A | Discharge status: Home / Self Care | Payer: Medicare HMO | Source: Ambulatory Visit | Attending: Family | Admitting: Family

## 2023-08-10 DIAGNOSIS — Q741 Congenital malformation of knee: Secondary | ICD-10-CM | POA: Diagnosis not present

## 2023-08-10 DIAGNOSIS — M79661 Pain in right lower leg: Secondary | ICD-10-CM

## 2023-08-11 ENCOUNTER — Ambulatory Visit (INDEPENDENT_AMBULATORY_CARE_PROVIDER_SITE_OTHER): Payer: Medicare HMO | Admitting: Neurology

## 2023-08-11 ENCOUNTER — Encounter: Payer: Self-pay | Admitting: Neurology

## 2023-08-11 ENCOUNTER — Telehealth: Payer: Self-pay | Admitting: Neurology

## 2023-08-11 VITALS — BP 151/86 | HR 92 | Ht 72.0 in | Wt 228.0 lb

## 2023-08-11 DIAGNOSIS — Z8679 Personal history of other diseases of the circulatory system: Secondary | ICD-10-CM | POA: Insufficient documentation

## 2023-08-11 DIAGNOSIS — I5032 Chronic diastolic (congestive) heart failure: Secondary | ICD-10-CM | POA: Diagnosis not present

## 2023-08-11 DIAGNOSIS — Z89511 Acquired absence of right leg below knee: Secondary | ICD-10-CM | POA: Diagnosis not present

## 2023-08-11 DIAGNOSIS — Z89512 Acquired absence of left leg below knee: Secondary | ICD-10-CM

## 2023-08-11 DIAGNOSIS — L98429 Non-pressure chronic ulcer of back with unspecified severity: Secondary | ICD-10-CM | POA: Diagnosis not present

## 2023-08-11 DIAGNOSIS — D751 Secondary polycythemia: Secondary | ICD-10-CM

## 2023-08-11 DIAGNOSIS — T387X5A Adverse effect of androgens and anabolic congeners, initial encounter: Secondary | ICD-10-CM | POA: Insufficient documentation

## 2023-08-11 NOTE — Telephone Encounter (Signed)
At 2:55 pt called to report that he was just picked up and that he is 5 mins away, phone rep informed pt that his arrival time was 2:30, pt states he was never made aware.  Pt informed he is a double amputee and is doing his best to get here as quickly as possible and if that isnt good enough he will simply never come here again.  Phone rep informed him it would be documented that he is on the way, and that all other remarks regarding what he had to say. This is FYI for check in staff and POD 3

## 2023-08-11 NOTE — Progress Notes (Signed)
SLEEP MEDICINE CLINIC    Provider:  Melvyn Novas, MD  Primary Care Physician:  Lula Olszewski, MD [address] [place] [address]     Referring Provider: Lula Olszewski, Md [address] [place],  [place] [address]          Chief Complaint according to patient   Patient presents with:     New sleep Patient (Initial Visit) with high RBC. Sent to oncology.            HISTORY OF PRESENT ILLNESS:  [name] is a 57 y.o. male patient who is seen upon referral on 08/11/2023 from PCP for a Sleep evaluation due to PTSD and due to polycythemia. He was just seen by oncology for the first time.   He reports PTSD since being admitted in 2022 with sepsis ,  having had stage 4th  bedsores. He has lost his right leg due to a dx of osteomyelitis, in [place], 07-2016 .  His pathology report stated later there was no osteomyelitis. (?) His left BKA  followed a transmetatarsal amputation of all toes, after a gangrenous toe infection.   Chief concern according to patient :  " I am here because I can't sleep "   I have the pleasure of seeing Arleigh Greenia 08/11/23 a right-handed male with a possible sleep disorder.   Oncologist stated;  Jamisen Steketee is a 57 y.o. gentleman with past medical history of insulin-dependent diabetes mellitus, dx in 2007 at age 13.  Developed diabetic neuropathy, nephropathy, congestive heart failure, male hypogonadism (on testosterone supplementation), hypertension, kidney stones, tumor on left kidney being monitored by urologist, history of bilateral BKA, was referred to our clinic for evaluation of elevated hemoglobin.   Discussed the use of AI scribe software for clinical note transcription with the patient, who gave verbal consent to proceed.    On 06/24/2023, labs at his PCPs office showed hemoglobin of 18.8, hematocrit of 56.9.  White count and platelet count were within normal limits.  Previously labs in April 2024 showed hemoglobin  of 19.4, hematocrit of 56.6.  Given persistent erythrocytosis, referral was sent to [place] for further evaluation.   The patient's hemoglobin has been slightly high since 2023, with a notable increase in the past year. The patient denies any history of blood clots, chest pain, trouble breathing, dizziness, or lightheadedness.   The patient has not been diagnosed with sleep apnea. The patient chews tobacco but does not smoke.  The patient also has a history of a stage 4 bed sore, which took 18 months to heal and still causes discomfort. The patient is scheduled for an MRI for a suspected issue with the right leg.   The patient never had a sleep study.    Sleep relevant medical history: Nocturia 3-4 times, No ENT surgery. Tonsillectomy, no cervical spine surgery.    Family medical /sleep history: no  other family member on CPAP with OSA,  No family history of DM,  has 3 siblings.    Social history:  Patient is disabled:  from [employer] educational needs [employer] and lives in a household alone. He is accompanied by a friend. BFF.  One dog.  Family status is divorced , with 2 adult children.  The patient used to work in shifts( night/ rotating,) Tobacco use: chewing.  ETOH use : none- quit ,  Caffeine intake in form of Coffee( /) Soda( mountain dew )  Tea ( /) or energy drinks.  Sleep  habits are as follows:  The patient's dinner time is between anytime PM. The patient goes to bed at anytime PM or 4 AM - and continues to sleep for a total of 5  hours at night , takes naps throughout the day.  The preferred sleep position is laterally,  has supine pain from a bedsore- with the support of 6-7 pillows.  Dreams are reportedly  nightmares - frequent. The patient wakes up spontaneously/ with an alarm.   9AM - 2.30 PM is the usual rise time. He reports not feeling refreshed or restored in AM, with symptoms such as dry mouth,  Naps are taken whenever.   Review of Systems: Out of a complete 14 system review, the patient  complains of only the following symptoms, and all other reviewed systems are negative.:  Fatigue, sleepiness , snoring, fragmented sleep, Insomnia, lack of routines.   How likely are you to doze in the following situations: 0 = not likely, 1 = slight chance, 2 = moderate chance, 3 = high chance   Sitting and Reading? Watching Television? Sitting inactive in a public place (theater or meeting)? As a passenger in a car for an hour without a break? Lying down in the afternoon when circumstances permit? Sitting and talking to someone? Sitting quietly after lunch without alcohol? In a car, while stopped for a few minutes in traffic?   Total = 4/ 24 points   FSS endorsed at 52/ 63 points.   Social History   Socioeconomic History   Marital status: Divorced    Spouse name: Not on file   Number of children: Not on file   Years of education: Not on file   Highest education level: Bachelor's degree (e.g., BA, AB, BS)  Occupational History   Not on file  Tobacco Use   Smoking status: Never   Smokeless tobacco: Current    Types: Chew  Vaping Use   Vaping status: Never Used  Substance and Sexual Activity   Alcohol use: Not Currently   Drug use: Never   Sexual activity: Not Currently  Other Topics Concern   Not on file  Social History Narrative   Pt lives alone   Pt not working    Social Determinants of Health   Financial Resource Strain: Low Risk  (06/20/2023)   Overall Financial Resource Strain (CARDIA)    Difficulty of Paying Living Expenses: Not hard at all  Food Insecurity: No Food Insecurity (07/27/2023)   Hunger Vital Sign    Worried About Running Out of Food in the Last Year: Never true    Ran Out of Food in the Last Year: Never true  Transportation Needs: No Transportation Needs (07/27/2023)   PRAPARE - Administrator, Civil Service (Medical): No    Lack of Transportation (Non-Medical): No  Physical Activity: Inactive (06/20/2023)   Exercise Vital Sign     Days of Exercise per Week: 0 days    Minutes of Exercise per Session: 0 min  Stress: Stress Concern Present (06/20/2023)   [employer] of Occupational Health - Occupational Stress Questionnaire    Feeling of Stress : Very much  Social Connections: Unknown (06/20/2023)   Social Connection and Isolation Panel [NHANES]    Frequency of Communication with Friends and Family: Once a week    Frequency of Social Gatherings with Friends and Family: Patient declined    Attends Religious Services: Never    Database administrator or Organizations: No    Attends [name]  or Organization Meetings: Never    Marital Status: Divorced    Family History  Problem Relation Age of Onset   Anxiety disorder Mother    Cancer Father    Coronary artery disease Father    Sleep apnea Neg Hx     Past Medical History:  Diagnosis Date   Acquired complex renal cyst 09/25/2019   Acute respiratory failure with hypoxia (HCC) secondary to suspected aspiration pneumonia 12/15/2021   AKI (acute kidney injury) (HCC) 07/22/2021   Cancer (HCC)    renal   Chest pain 07/22/2021   Complication of anesthesia    Decubitus ulcer of sacral area 12/15/2021   Depression    Diabetes mellitus without complication (HCC)    Diabetic infection of left foot (HCC) 04/17/2021   Episode of unresponsiveness 12/15/2021   Gas gangrene of foot (HCC) 07/22/2021   Healthcare maintenance 09/25/2019   Hematuria 09/25/2019   History of kidney stones    Lactic acidosis 12/15/2021   Leukocytosis 09/13/2021   Osteomyelitis (HCC)    Sacral pressure injury of skin 07/27/2021   Septic shock (HCC) 07/22/2021   Severe sepsis with acute organ dysfunction (HCC) 07/22/2021   Shortness of breath 05/16/2021   Stage 3a chronic kidney disease (HCC) 09/25/2019   Systolic dysfunction    Type 2 diabetes mellitus 10/12/2019    Past Surgical History:  Procedure Laterality Date   AMPUTATION Left 07/23/2021   Procedure: LEFT BELOW KNEE  AMPUTATION;  Surgeon: Nadara Mustard, MD;  Location: [facility] OR;  Service: Orthopedics;  Laterality: Left;   BELOW KNEE LEG AMPUTATION Right    BUBBLE STUDY  07/29/2021   Procedure: BUBBLE STUDY;  Surgeon: Quintella Reichert, MD;  Location: [facility] ENDOSCOPY;  Service: Cardiovascular;;   CARDIOVERSION N/A 07/29/2021   Procedure: CARDIOVERSION;  Surgeon: Quintella Reichert, MD;  Location: [facility] ENDOSCOPY;  Service: Cardiovascular;  Laterality: N/A;   RADIOLOGY WITH ANESTHESIA N/A 08/05/2021   Procedure: MRI LUMBAR WITH AND WITHOUT; THORASIC SPINE WITH AND WITHOUT WITH ANESTHESIA;  Surgeon: Radiologist, Medication, MD;  Location: [facility] OR;  Service: Radiology;  Laterality: N/A;   RADIOLOGY WITH ANESTHESIA N/A 08/07/2021   Procedure: MRI WITH LUMBER WITH AND WITHOUT CONTRAST,THORACIC WITH AND WITHOUT CONTRAST;  Surgeon: Radiologist, Medication, MD;  Location: [facility] OR;  Service: Radiology;  Laterality: N/A;   RADIOLOGY WITH ANESTHESIA N/A 09/13/2021   Procedure: MRI WITH ANESTHESIA;  Surgeon: Julieanne Cotton, MD;  Location: [facility] OR;  Service: Radiology;  Laterality: N/A;   TEE WITHOUT CARDIOVERSION N/A 07/29/2021   Procedure: TRANSESOPHAGEAL ECHOCARDIOGRAM (TEE);  Surgeon: Quintella Reichert, MD;  Location: [facility] ENDOSCOPY;  Service: Cardiovascular;  Laterality: N/A;     Current Outpatient Medications on File Prior to Visit  Medication Sig Dispense Refill   ACCU-CHEK GUIDE test strip USE UP TO SIX TIMES DAILY TO CHECK BLOOD SUGARS 200 each 0   aspirin EC 325 MG tablet Take 1 tablet (325 mg total) by mouth daily. 90 tablet 3   baclofen (LIORESAL) 10 MG tablet TAKE 1 TABLET BY MOUTH THREE TIMES DAILY 30 tablet 0   blood glucose meter kit and supplies KIT Dispense based on patient and insurance preference. Use up to four times daily as directed. 1 each 0   celecoxib (CELEBREX) 100 MG capsule Take 1 capsule (100 mg total) by mouth 2 (two) times daily. 60 capsule 0   furosemide (LASIX) 20 MG tablet TAKE 1 TABLET BY MOUTH ONCE DAILY .  APPOINTMENT REQUIRED FOR FUTURE REFILLS 30 tablet 1  insulin NPH-regular Human (NOVOLIN 70/30 RELION) (70-30) 100 UNIT/ML injection Inject 15 Units into the skin 2 (two) times daily with a meal. (Patient taking differently: Inject 3-12 Units into the skin 2 (two) times daily with a meal. Sliding scale) 10 mL 11   lisinopril (ZESTRIL) 10 MG tablet Take 1 tablet (10 mg total) by mouth daily. KEEP JAN. OV. 30 tablet 3   Oxycodone HCl 10 MG TABS Take 10 mg by mouth 3 (three) times daily as needed (Pain).     pregabalin (LYRICA) 75 MG capsule Take 75 mg by mouth 2 (two) times daily.     rosuvastatin (CRESTOR) 20 MG tablet Take 1 tablet by mouth once daily 90 tablet 0   sildenafil (VIAGRA) 100 MG tablet Take 100 mg by mouth daily as needed.     tamsulosin (FLOMAX) 0.4 MG CAPS capsule Take 0.4 mg by mouth daily.     testosterone cypionate (DEPO-TESTOSTERONE) 200 MG/ML injection Inject 1 mL (200 mg total) into the muscle every 14 (fourteen) days. Significant Risks of heart attack, stroke with continued use. Reducing dose as tolerated highly recommended 10 mL 0   No current facility-administered medications on file prior to visit.    Allergies  Allergen Reactions   Bactrim [Sulfamethoxazole-Trimethoprim] Other (See Comments)    Effect Kidney   Ciprofloxacin Other (See Comments)    Kidney function    Levaquin [Levofloxacin] Other (See Comments)    Effect kidney function     DIAGNOSTIC DATA (LABS, IMAGING, TESTING) - I reviewed patient records, labs, notes, testing and imaging myself where available.  Lab Results  Component Value Date   WBC 7.4 07/27/2023   HGB 18.5 (H) 07/27/2023   HCT 55.0 (H) 07/27/2023   MCV 88.7 07/27/2023   PLT 201 07/27/2023      Component Value Date/Time   NA 139 07/27/2023 1412   NA 131 (A) 03/20/2022 0000   K 4.2 07/27/2023 1412   CL 104 07/27/2023 1412   CO2 30 07/27/2023 1412   GLUCOSE 100 (H) 07/27/2023 1412   BUN 18 07/27/2023 1412   BUN 32 (A)  03/20/2022 0000   CREATININE 1.19 07/27/2023 1412   CALCIUM 9.4 07/27/2023 1412   PROT 6.7 07/27/2023 1412   PROT 7.0 12/05/2021 1158   ALBUMIN 4.0 07/27/2023 1412   ALBUMIN 4.0 12/05/2021 1158   AST 32 07/27/2023 1412   ALT 45 (H) 07/27/2023 1412   ALKPHOS 85 07/27/2023 1412   BILITOT 0.6 07/27/2023 1412   BILITOT 0.4 12/05/2021 1158   GFRNONAA >60 07/27/2023 1412   GFRAA 57 (L) 09/13/2019 0924   Lab Results  Component Value Date   CHOL 105 06/24/2023   HDL 37 (L) 06/24/2023   LDLCALC 47 06/24/2023   TRIG 127 06/24/2023   CHOLHDL 2.8 06/24/2023   Lab Results  Component Value Date   HGBA1C 8.1 (H) 06/24/2023   No results found for: "VITAMINB12" Lab Results  Component Value Date   TSH 2.340 06/24/2023    PHYSICAL EXAM:  Today's Vitals   08/11/23 1517  BP: (!) 151/86  Pulse: 92  Weight: 228 lb (103.4 kg)  Height: 6' (1.829 m)   Body mass index is 30.92 kg/m.   Wt Readings from Last 3 Encounters:  08/11/23 228 lb (103.4 kg)  03/22/23 202 lb (91.6 kg)  06/15/22 202 lb (91.6 kg)     Ht Readings from Last 3 Encounters:  08/11/23 6' (1.829 m)  07/28/23 6\' 3"  (1.905 m)  06/24/23 6\' 3"  (  1.905 m)      General: The patient is awake, alert and appears not in acute distress.  Facial hair. Head: Normocephalic, atraumatic. Neck is supple.  Mallampati 3,  neck circumference: 20.5 inches .  Nasal airflow not patent.  Retrognathia is not seen.  Dental status:  Cardiovascular:  Regular rate and cardiac rhythm by pulse,  without distended neck veins. Respiratory: Lungs are clear to auscultation.  Skin:    NEUROLOGIC EXAM: The patient is awake and alert, oriented to place and time.   Memory subjective described as intact.  Attention span & concentration ability appears normal.  Speech is fluent,  without  dysarthria, dysphonia or aphasia.  Mood and affect are appropriate.   Cranial nerves: no loss of smell or taste reported  Pupils are equal and briskly reactive to  light. Funduscopic exam deferred.  Extraocular movements in vertical and horizontal planes were intact and without nystagmus. No Diplopia. Visual fields by finger perimetry are intact. Hearing was intact to soft voice and finger rubbing. Facial sensation intact to fine touch.  Facial motor strength is symmetric and tongue and uvula move midline.  Neck ROM : rotation, tilt and flexion extension were normal for age and shoulder shrug was symmetrical.    Motor exam:  Symmetric bulk, tone and ROM.   Normal tone without cog -wheeling, symmetric grip strength .     Coordination: The Finger-to-nose maneuver was intact without evidence of ataxia, dysmetria or tremor.   Gait and station: deferred.     ASSESSMENT AND PLAN 21 - year -old male  here with:    1)  erythrocytosis, needing testing for hypoxemia or OSA related intermittent desaturation.   2) OSA testing by HST.  Due to needing hypoxia data, needs to have watch pat.   3) The patient has  non organic insomnia, needs structures and routines, not sleep aids.   I plan to follow up either personally or through our NP within 5-6 months.   I would like to thank Lula Olszewski, Md [address] [place],  [place] [address] for allowing me to meet with and to take care of this pleasant patient.    After spending a total time of 60 minutes face to face and additional time for physical and neurologic examination, review of laboratory studies,  personal review of imaging studies, reports and results of other testing and review of referral information / records as far as provided in visit,   Electronically signed by: Melvyn Novas, MD 08/11/2023 3:24 PM  [facility] Board certified by The [employer] of Sleep Medicine and Diplomate of the [employer] of Sleep Medicine. Board certified In Neurology through the ABPN, Fellow of the [employer] of Neurology.

## 2023-08-11 NOTE — Patient Instructions (Signed)
Quality Sleep Information, Adult Quality sleep is important for your mental and physical health. It also improves your quality of life. Quality sleep means you: Are asleep for most of the time you are in bed. Fall asleep within 30 minutes. Wake up no more than once a night. Are awake for no longer than 20 minutes if you do wake up during the night. Most adults need 7-8 hours of quality sleep each night. How can poor sleep affect me? If you do not get enough quality sleep, you may have: Mood swings. Daytime sleepiness. Decreased alertness, reaction time, and concentration. Sleep disorders, such as insomnia and sleep apnea. Difficulty with: Solving problems. Coping with stress. Paying attention. These issues may affect your performance and productivity at work, school, and home. Lack of sleep may also put you at higher risk for accidents, suicide, and risky behaviors. If you do not get quality sleep, you may also be at higher risk for several health problems, including: Infections. Type 2 diabetes. Heart disease. High blood pressure. Obesity. Worsening of long-term conditions, like arthritis, kidney disease, depression, Parkinson's disease, and epilepsy. What actions can I take to get more quality sleep? Sleep schedule and routine Stick to a sleep schedule. Go to sleep and wake up at about the same time each day. Do not try to sleep less on weekdays and make up for lost sleep on weekends. This does not work. Limit naps during the day to 30 minutes or less. Do not take naps in the late afternoon. Make time to relax before bed. Reading, listening to music, or taking a hot bath promotes quality sleep. Make your bedroom a place that promotes quality sleep. Keep your bedroom dark, quiet, and at a comfortable room temperature. Make sure your bed is comfortable. Avoid using electronic devices that give off bright blue light for 30 minutes before bedtime. Your brain perceives bright blue light  as sunlight. This includes television, phones, and computers. If you are lying awake in bed for longer than 20 minutes, get up and do a relaxing activity until you feel sleepy. Lifestyle     Try to get at least 30 minutes of exercise on most days. Do not exercise 2-3 hours before going to bed. Do not use any products that contain nicotine or tobacco. These products include cigarettes, chewing tobacco, and vaping devices, such as e-cigarettes. If you need help quitting, ask your health care provider. Do not drink caffeinated beverages for at least 8 hours before going to bed. Coffee, tea, and some sodas contain caffeine. Do not drink alcohol or eat large meals close to bedtime. Try to get at least 30 minutes of sunlight every day. Morning sunlight is best. Medical concerns Work with your health care provider to treat medical conditions that may affect sleeping, such as: Nasal obstruction. Snoring. Sleep apnea and other sleep disorders. Talk to your health care provider if you think any of your prescription medicines may cause you to have difficulty falling or staying asleep. If you have sleep problems, talk with a sleep consultant. If you think you have a sleep disorder, talk with your health care provider about getting evaluated by a specialist. Where to find more information Sleep Foundation: sleepfoundation.org American Academy of Sleep Medicine: aasm.org Centers for Disease Control and Prevention (CDC): cdc.gov Contact a health care provider if: You have trouble getting to sleep or staying asleep. You often wake up very early in the morning and cannot get back to sleep. You have daytime sleepiness. You   have daytime sleep attacks of suddenly falling asleep and sudden muscle weakness (narcolepsy). You have a tingling sensation in your legs with a strong urge to move your legs (restless legs syndrome). You stop breathing briefly during sleep (sleep apnea). You think you have a sleep  disorder or are taking a medicine that is affecting your quality of sleep. Summary Most adults need 7-8 hours of quality sleep each night. Getting enough quality sleep is important for your mental and physical health. Make your bedroom a place that promotes quality sleep, and avoid things that may cause you to have poor sleep, such as alcohol, caffeine, smoking, or large meals. Talk to your health care provider if you have trouble falling asleep or staying asleep. This information is not intended to replace advice given to you by your health care provider. Make sure you discuss any questions you have with your health care provider. Document Revised: 12/17/2021 Document Reviewed: 12/17/2021 Elsevier Patient Education  2024 Elsevier Inc.  

## 2023-08-11 NOTE — Addendum Note (Signed)
Addended by: Melvyn Novas on: 08/11/2023 05:04 PM   Modules accepted: Orders

## 2023-08-17 NOTE — Progress Notes (Signed)
Do you mind rescheduling him to see duda for his mri review

## 2023-08-18 ENCOUNTER — Ambulatory Visit: Payer: Medicare HMO | Admitting: Family

## 2023-08-21 ENCOUNTER — Other Ambulatory Visit: Payer: Self-pay | Admitting: Internal Medicine

## 2023-08-21 DIAGNOSIS — M79609 Pain in unspecified limb: Secondary | ICD-10-CM

## 2023-08-21 DIAGNOSIS — M19019 Primary osteoarthritis, unspecified shoulder: Secondary | ICD-10-CM

## 2023-08-23 DIAGNOSIS — N281 Cyst of kidney, acquired: Secondary | ICD-10-CM | POA: Diagnosis not present

## 2023-08-24 ENCOUNTER — Ambulatory Visit: Payer: Medicare HMO | Attending: Internal Medicine | Admitting: Physical Therapy

## 2023-08-24 DIAGNOSIS — Z89511 Acquired absence of right leg below knee: Secondary | ICD-10-CM | POA: Insufficient documentation

## 2023-08-24 DIAGNOSIS — G8929 Other chronic pain: Secondary | ICD-10-CM | POA: Diagnosis not present

## 2023-08-24 DIAGNOSIS — M6281 Muscle weakness (generalized): Secondary | ICD-10-CM | POA: Insufficient documentation

## 2023-08-24 DIAGNOSIS — Z89512 Acquired absence of left leg below knee: Secondary | ICD-10-CM | POA: Diagnosis not present

## 2023-08-24 DIAGNOSIS — Z515 Encounter for palliative care: Secondary | ICD-10-CM | POA: Diagnosis not present

## 2023-08-24 DIAGNOSIS — M47816 Spondylosis without myelopathy or radiculopathy, lumbar region: Secondary | ICD-10-CM | POA: Diagnosis not present

## 2023-08-24 DIAGNOSIS — M19019 Primary osteoarthritis, unspecified shoulder: Secondary | ICD-10-CM | POA: Insufficient documentation

## 2023-08-24 DIAGNOSIS — Z0189 Encounter for other specified special examinations: Secondary | ICD-10-CM | POA: Diagnosis not present

## 2023-08-24 DIAGNOSIS — R2681 Unsteadiness on feet: Secondary | ICD-10-CM | POA: Diagnosis not present

## 2023-08-24 DIAGNOSIS — M549 Dorsalgia, unspecified: Secondary | ICD-10-CM | POA: Diagnosis not present

## 2023-08-24 DIAGNOSIS — L89154 Pressure ulcer of sacral region, stage 4: Secondary | ICD-10-CM | POA: Diagnosis not present

## 2023-08-24 DIAGNOSIS — R2689 Other abnormalities of gait and mobility: Secondary | ICD-10-CM | POA: Insufficient documentation

## 2023-08-24 DIAGNOSIS — Z993 Dependence on wheelchair: Secondary | ICD-10-CM | POA: Diagnosis not present

## 2023-08-24 DIAGNOSIS — M5136 Other intervertebral disc degeneration, lumbar region with discogenic back pain only: Secondary | ICD-10-CM | POA: Diagnosis not present

## 2023-08-24 DIAGNOSIS — I5032 Chronic diastolic (congestive) heart failure: Secondary | ICD-10-CM | POA: Insufficient documentation

## 2023-08-24 DIAGNOSIS — Z7409 Other reduced mobility: Secondary | ICD-10-CM | POA: Diagnosis not present

## 2023-08-24 NOTE — Therapy (Signed)
OUTPATIENT PHYSICAL THERAPY WHEELCHAIR EVALUATION   Patient Name: [name] MRN: [id number] DOB:06/29/1966, 57 y.o., male Today's Date: 08/24/2023  END OF SESSION:  PT End of Session - 08/24/23 1235     Visit Number 1    Number of Visits 1    Date for PT Re-Evaluation 08/24/23    Authorization Type [employer]    PT Start Time 1230    PT Stop Time 1300    PT Time Calculation (min) 30 min    Activity Tolerance Patient tolerated treatment well    Behavior During Therapy WFL for tasks assessed/performed             Past Medical History:  Diagnosis Date   Acquired complex renal cyst 09/25/2019   Acute respiratory failure with hypoxia (HCC) secondary to suspected aspiration pneumonia 12/15/2021   AKI (acute kidney injury) (HCC) 07/22/2021   Cancer (HCC)    renal   Chest pain 07/22/2021   Complication of anesthesia    Decubitus ulcer of sacral area 12/15/2021   Depression    Diabetes mellitus without complication (HCC)    Diabetic infection of left foot (HCC) 04/17/2021   Episode of unresponsiveness 12/15/2021   Gas gangrene of foot (HCC) 07/22/2021   Healthcare maintenance 09/25/2019   Hematuria 09/25/2019   History of kidney stones    Lactic acidosis 12/15/2021   Leukocytosis 09/13/2021   Osteomyelitis (HCC)    Sacral pressure injury of skin 07/27/2021   Septic shock (HCC) 07/22/2021   Severe sepsis with acute organ dysfunction (HCC) 07/22/2021   Shortness of breath 05/16/2021   Stage 3a chronic kidney disease (HCC) 09/25/2019   Systolic dysfunction    Type 2 diabetes mellitus 10/12/2019   Past Surgical History:  Procedure Laterality Date   AMPUTATION Left 07/23/2021   Procedure: LEFT BELOW KNEE AMPUTATION;  Surgeon: Nadara Mustard, MD;  Location: [facility] OR;  Service: Orthopedics;  Laterality: Left;   BELOW KNEE LEG AMPUTATION Right    BUBBLE STUDY  07/29/2021   Procedure: BUBBLE STUDY;  Surgeon: Quintella Reichert, MD;  Location: [facility] ENDOSCOPY;  Service:  Cardiovascular;;   CARDIOVERSION N/A 07/29/2021   Procedure: CARDIOVERSION;  Surgeon: Quintella Reichert, MD;  Location: [facility] ENDOSCOPY;  Service: Cardiovascular;  Laterality: N/A;   RADIOLOGY WITH ANESTHESIA N/A 08/05/2021   Procedure: MRI LUMBAR WITH AND WITHOUT; THORASIC SPINE WITH AND WITHOUT WITH ANESTHESIA;  Surgeon: Radiologist, Medication, MD;  Location: [facility] OR;  Service: Radiology;  Laterality: N/A;   RADIOLOGY WITH ANESTHESIA N/A 08/07/2021   Procedure: MRI WITH LUMBER WITH AND WITHOUT CONTRAST,THORACIC WITH AND WITHOUT CONTRAST;  Surgeon: Radiologist, Medication, MD;  Location: [facility] OR;  Service: Radiology;  Laterality: N/A;   RADIOLOGY WITH ANESTHESIA N/A 09/13/2021   Procedure: MRI WITH ANESTHESIA;  Surgeon: Julieanne Cotton, MD;  Location: [facility] OR;  Service: Radiology;  Laterality: N/A;   TEE WITHOUT CARDIOVERSION N/A 07/29/2021   Procedure: TRANSESOPHAGEAL ECHOCARDIOGRAM (TEE);  Surgeon: Quintella Reichert, MD;  Location: [facility] ENDOSCOPY;  Service: Cardiovascular;  Laterality: N/A;   Patient Active Problem List   Diagnosis Date Noted   Adverse reaction to intramuscular testosterone 08/11/2023   History of atrial fibrillation 08/11/2023   Shoulder arthritis 07/28/2023   Amputation stump pain (HCC) 07/28/2023   Chronic back pain 12/24/2022   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HCC) 07/24/2022   Malaise 06/22/2022   Erythrocytosis 06/22/2022   Amputation below knee (HCC) 04/23/2022   PTSD (post-traumatic stress disorder) 04/23/2022   Generalized anxiety disorder 01/21/2022  Stage 4 skin ulcer of sacral region (HCC) 12/29/2021   Episode of unresponsiveness 12/15/2021   Renal mass 12/15/2021   Stage IV pressure ulcer of sacral region (HCC) 09/15/2021   Insomnia 09/14/2021   Chronic systolic CHF (congestive heart failure) (HCC) 09/14/2021   Hypogonadism in male 09/14/2021   Chronic pain 09/14/2021   Pseudohyponatremia 09/13/2021   Normocytic anemia 09/13/2021   Discitis of thoracic region     Paroxysmal atrial flutter (HCC) 07/22/2021   Chronic diastolic (congestive) heart failure (HCC) 05/16/2021   Lumbar spondylosis 03/25/2021   H/O amputation of leg through tibia and fibula (HCC) 11/10/2019   Degeneration of lumbar intervertebral disc 11/10/2019   Hypercholesterolemia 11/10/2019   S/P bilateral below knee amputation (HCC) 10/30/2019   Dyslipidemia 10/30/2019   Type 1 diabetes mellitus with diabetic polyneuropathy, with long-term current use of insulin (HCC) 10/12/2019   Hypertensive disorder 09/25/2019   Urinary hesitancy 09/25/2019   Prosthesis adjustments 09/25/2019   Erectile dysfunction 09/25/2019   Palliative care status 09/25/2019   Pulmonary nodules 09/25/2019   Depressive disorder 09/25/2019    PCP: Lula Olszewski, MD  REFERRING PROVIDER: Lula Olszewski, MD  THERAPY DIAG:  Other abnormalities of gait and mobility  Unsteadiness on feet  Muscle weakness (generalized)  Impaired functional mobility and activity tolerance  Rationale for Evaluation and Treatment Habilitation  SUBJECTIVE:                                                                                                                                                                                           SUBJECTIVE STATEMENT: Pt presents for wheelchair evaluation. Pt with a history of a R BKA in 2017 and a L BKA in 2022. He reports that in January of 2023 he received a manual wheelchair but that pushing himself around in this chair has lead to increased shoulder pain and he has difficulty propelling himself around his home. Pt states, "I'm done using my arms". 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  PRECAUTIONS: Fall   RED FLAGS: None  WEIGHT BEARING RESTRICTIONS No    OCCUPATION:  on disability  PLOF:  Needs assistance with ADLs, Needs assistance with homemaking, and Needs assistance with transfers  PATIENT GOALS: to obtain power wheeled  mobility to allow for safe and independent pressure relief, safe and independent mobility, and ability to complete MRADLs in a safe and timely manner           MEDICAL HISTORY:  Primary diagnosis onset: 07/23/2021     Medical Diagnosis with ICD-10 code: S/p bilateral below knee amputation Z89.512, Z89.511   []   Progressive disease  Relevant future surgeries: N/A    Height: 6'3" with legs Weight: 228 lbs Explain recent changes or trends in weight:  N/A    History:  Past Medical History:  Diagnosis Date   Acquired complex renal cyst 09/25/2019   Acute respiratory failure with hypoxia (HCC) secondary to suspected aspiration pneumonia 12/15/2021   AKI (acute kidney injury) (HCC) 07/22/2021   Cancer (HCC)    renal   Chest pain 07/22/2021   Complication of anesthesia    Decubitus ulcer of sacral area 12/15/2021   Depression    Diabetes mellitus without complication (HCC)    Diabetic infection of left foot (HCC) 04/17/2021   Episode of unresponsiveness 12/15/2021   Gas gangrene of foot (HCC) 07/22/2021   Healthcare maintenance 09/25/2019   Hematuria 09/25/2019   History of kidney stones    Lactic acidosis 12/15/2021   Leukocytosis 09/13/2021   Osteomyelitis (HCC)    Sacral pressure injury of skin 07/27/2021   Septic shock (HCC) 07/22/2021   Severe sepsis with acute organ dysfunction (HCC) 07/22/2021   Shortness of breath 05/16/2021   Stage 3a chronic kidney disease (HCC) 09/25/2019   Systolic dysfunction    Type 2 diabetes mellitus 10/12/2019       Cardio Status:  Functional Limitations:   [x] Intact  []  Impaired      Respiratory Status:  Functional Limitations:   [x] Intact  [] Impaired   [] SOB [] COPD [] O2 Dependent ______LPM  [] Ventilator Dependent  Resp equip:                                                     Objective Measure(s):   Orthotics:   [x] Amputee:      bilateral BKA                                              [x] Prosthesis: B prosthetics, currently unable  to wear R one due to pain, having imaging performed       HOME ENVIRONMENT:  [] House [] Condo/town home [x] Apartment [] Asst living [] LTCF         [] Own  [x] Rent   [x] Lives alone [] Lives with others -                             Hours without assistance: 24/7  [x] Home is accessible to patient                                 Storage of wheelchair:  [x] In home   [] Other Comments:        COMMUNITY :  TRANSPORTATION:  [x] Car [] Production assistant, radio [] Adapted w/c Lift []  Ambulance [] Other:                     [] Sits in wheelchair during transport   Where is w/c stored during transport? Manual wheelchair stored in trunk of the car [] Tie Downs  []  EZ Lock  r   [] Self-Driver       Drive while in  Wheelchair [] yes [x] no   Employment and/or school: N/A Specific requirements pertaining to mobility  Other:  COMMUNICATION:  Verbal Communication  [x] WFL [] receptive [x] WFL [] expressive [] Understandable  [] Difficult to understand  [] non-communicative  Primary Language:____English__________ 2nd:_____________  Communication provided by:[x] Patient [] Family [] Caregiver [] Translator   [] Uses an augmentative communication device     Manufacturer/Model :                                                                MOBILITY/BALANCE:  Sitting Balance  Standing Balance  Transfers  Ambulation   [x] WFL      [] WFL  [x] Independent  []  Independent   [] Uses UE for balance in sitting Comments:  [] Uses UE/device for stability Comments:  []  Min assist  []  Ambulates independently with       device:___________________      []  Mod assist  []  Able to ambulate ______ feet        safely/functionally/independently   []  Min assist  []  Min assist  []  Max assist  []  Non-functional ambulator         History/High risk of falls   []  Mod assist  []  Mod assist  []  Dependent  [x]  Unable to ambulate   []  Max  assist  []  Max assist  Transfer method:[] 1 person [] 2 person [] sliding board [] squat pivot [] stand pivot  [] mechanical patient lift  [] other:   []  Unable  [x]  Unable    Fall History: # of falls in the past 6 months? 0 # of "near" falls in the past 6 months? 0    CURRENT SEATING / MOBILITY:  Current Mobility Device: [] None [] Cane/Walker [x] Manual [] Dependent [] Dependent w/ Tilt rScooter  [] Power (type of control):   Manufacturer: Drive Model:  Serial #:   Size: 20x18 Color:  Age: Jan 2023  Purchased by whom: patient and insurance  Current condition of mobility base:  shows age-related wear and tear  Current seating system:             K0004 manual wheelchair                                                          Age of seating system:  January 2023  Describe posture in present seating system: In his current manual wheelchair patient presents with posterior pelvic tilt and kyphotic posture. He is frequently shifting in the chair and leaning to one side in attempts to offload his sacrum due to pain at this site from history of a stage 4 pressure ulcer.   Is the current mobility meeting medical necessity?:  [] Yes [x] No Describe: In his current manual wheelchair Mr. Tonjes is not able to perform adequate pressure relief throughout the day. He is unable to safely stand up as he is unable to don his RLE prosthetic and he is unable to perform anterior or lateral leans or a wheelchair pushup due to his history of a L shoulder injury. Additionally, he is not able to propel himself efficiently in his manual wheelchair due to impaired BUE shoulder strength and ROM. Due to his body habitus and history of a sacral pressure ulcer he requires power wheeled mobility for safe and independent mobility.  Ability to complete Mobility-Related Activities of Daily Living (MRADL's) with Current Mobility Device:   Move room to room  [x] Independent  [] Min [] Mod [] Max assist  [] Unable  Comments:   Meal prep  [x] Independent  [] Min [] Mod [] Max assist  [] Unable    Feeding  [x] Independent   [] Min [] Mod [] Max assist  [] Unable    Bathing  [x] Independent  [] Min [] Mod [] Max assist  [] Unable    Grooming  [x] Independent  [] Min [] Mod [] Max assist  [] Unable    UE dressing  [x] Independent  [] Min [] Mod [] Max assist  [] Unable    LE dressing  [x] Independent   [] Min [] Mod [] Max assist  [] Unable    Toileting  [x] Independent  [] Min [] Mod [] Max assist  [] Unable    Bowel Mgt: [x]  Continent []  Incontinent []  Accidents []  Diapers []  Colostomy []  Bowel Program:  Bladder Mgt: [x]  Continent []  Incontinent []  Accidents []  Diapers []  Urinal []  Intermittent Cath []  Indwelling Cath []  Supra-pubic Cath     Current Mobility Equipment Trialed/ Ruled Out:    Does not meet mobility needs due to:    Mark all boxes that indicate inability to use the specific equipment listed     Meets needs for safe  independent functional  ambulation  / mobility    Risk of  Falling or History of Falls    Enviromental limitations      Cognition    Safety concerns with  physical ability    Decreased / limitations endurance  & strength     Decreased / limitations  motor skills  & coordination    Pain    Pace /  Speed    Cardiac and/or  respiratory condition    Contra - indicated by diagnosis   Cane/Crutches  []   [x]   []   []   [x]   [x]   [x]   [x]   [x]   []   [x]    Walker / Rollator  []  NA   []   [x]   []   []   [x]   [x]   [x]   [x]   [x]   []   [x]     Manual Wheelchair [id number]:  []  NA  []   []   []   []   [x]   [x]   [x]   [x]   [x]   []   []    Manual W/C (K0005) with power assist  []  NA  []   []   []   []   [x]   [x]   [x]   [x]   [x]   []   []    Scooter  []  NA  []   []   []   []   [x]   [x]   [x]   []   []   []   []    Power Wheelchair: standard joystick  []  NA  [x]   []   []   []   []   []   []   []   []   []   []    Power Wheelchair: alternative controls  []  NA  []   []   []   []   []   []   []   []   []   []   []    Summary:  The least costly alternative for independent functional mobility was found to be:    []  Crutch/Cane  []  Walker []  Manual w/c  []  Manual w/c  with power assist   []  Scooter   [x]  Power w/c std joystick   []  Power w/c alternative control        []  Requires dependent care mobility device   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 for safe mobility equipment operation  [x]   Yes []   No  Patient  is willing and motivated to use recommended mobility equipment  [x]   Yes []   No       []  Patient is unable to safely operate mobility equipment independently and requires dependent care equipment Comments:           SENSATION and SKIN ISSUES:  Sensation []  Intact  [x]  Impaired []  Absent []  Hyposensate []  Hypersensate  []  Defensiveness  Location(s) of impairment: impaired in BLE and sacrum   Pressure Relief Method(s):  []  Lean side to side to offload (without risk of falling)  []   W/C push up (4+ times/hour for 15+ seconds) []  Stand up (without risk of falling)    []  Other: (Describe): Effective pressure relief method(s) above can be performed consistently throughout the day: [] Yes  [x]  No If not, Why?: Mr. Coggeshall is not able to stand up to perform pressure relief throughout the day due to his history of B BKAs with current inability to wear his RLE prosthetic due to pain and bony abnormalities. He is not able to perform lateral or anterior leans in his current manual chair and is not able to perform a wheelchair push-up adequately enough to perform pressure relief throughout the day due to his history of a L shoulder injury. He has a history of a stage 4 sacral ulcer and needs to be able to perform adequate pressure relief to prevent further skin breakdown.  Skin Integrity Risk:       []  Low risk           []  Moderate risk            [x]  High risk  If high risk, explain: Mr. Warden is at a high risk for skin breakdown on his sacrum due to his inability to perform safe and adequate pressure relief throughout the day in his current manual wheelchair as stated above.  Skin Issues/Skin Integrity  Current  skin Issues  []  Yes [x]  No []  Intact  []   Red area   []   Open area  []  Scar tissue  [x]  At risk from prolonged sitting  Where: sacrum History of Skin Issues  [x]  Yes []  No Where : sacrum When: November 2022 Stage: stage IV Hx of skin flap surgeries  []  Yes [x]  No Where:  When:  Pain: [x]  Yes []  No   Pain Location(s): back (from DDD), "ass" Intensity scale: (0-10) : back: 8/10, "ass" 9/10 How does pain interfere with mobility and/or MRADLs? - "I can't do shit"        MAT EVALUATION:  Neuro-Muscular Status: (Tone, Reflexive, Responses, etc.)     [x]   Intact   []  Spasticity:  []  Hypotonicity  []  Fluctuating  []  Muscle Spasms  []  Poor Righting Reactions/Poor Equilibrium Reactions  []  Primal Reflex(s):    Comments:            COMMENTS:    POSTURE:     Comments:  Pelvis Anterior/Posterior:  []  Neutral   [x]  Posterior  []  Anterior  []  Fixed - No movement [x]  Tendency away from neutral []  Flexible []  Self-correction []  External correction Obliquity (viewed from front)  [x]  WFL []  R Obliquity []  L Obliquity  []  Fixed - No movement []  Tendency away from neutral []  Flexible []  Self-correction []  External correction Rotation  [x]  WFL []  R anterior []  L anterior  []  Fixed - No movement []  Tendency away from neutral []  Flexible []  Self-correction []  External correction Tonal Influence Pelvis:  [x]  Normal []  Flaccid []  Low  tone []  Spasticity []  Dystonia []  Pelvis thrust []  Other:    Trunk Anterior/Posterior:  []  WFL [x]  Thoracic kyphosis []  Lumbar lordosis  []  Fixed - No movement [x]  Tendency away from neutral []  Flexible []  Self-correction []  External correction  [x]  WFL []  Convex to left  []  Convex to right []  S-curve   []  C-curve []  Multiple curves []  Tendency away from neutral []  Flexible []  Self-correction []  External correction Rotation of shoulders and upper trunk:  [x]  Neutral []  Left-anterior []  Right- anterior []  Fixed-  no movement []  Tendency away from neutral []  Flexible []  Self correction []  External correction Tonal influence Trunk:  [x]  Normal []  Flaccid []  Low tone []  Spasticity []  Dystonia []  Other:   Head & Neck  [x]  Functional []  Flexed    []  Extended []  Rotated right  []  Rotated left []  Laterally flexed right []  Laterally flexed left []  Cervical hyperextension   [x]  Good head control []  Adequate head control []  Limited head control []  Absent head control Describe tone/movement of head and neck: WFL     Lower Extremity Measurements: LE ROM:  Active ROM Right 08/24/2023 Left 08/24/2023  Hip flexion Tight hip flexors Tight hip flexors  Hip extension    Hip abduction    Hip adduction    Knee flexion BKA BKA  Knee extension BKA BKA  Ankle dorsiflexion    Ankle plantarflexion     (Blank rows = not tested)  LE MMT:  MMT Right 08/24/2023 Left 08/24/2023  Hip flexion 5 5  Hip extension    Hip abduction    Hip adduction    Knee flexion    Knee extension    Ankle dorsiflexion    Ankle plantarflexion     (Blank rows = not tested)  Hip positions:  []  Neutral   [x]  Abducted   []  Adducted  []  Subluxed   []  Dislocated   []  Fixed   []  Tendency away from neutral [x]  Flexible [x]  Self-correction []  External correction   Hip Windswept:[x]  Neutral  []  Right    []  Left  []  Subluxed   []  Dislocated   []  Fixed   []  Tendency away from neutral []  Flexible []  Self-correction []  External correction  LE Tone: [x]  Normal []  Low tone []  Spasticity []  Flaccid []  Dystonia []  Rocks/Extends at hip []  Thrust into knee extension []  Pushes legs downward into footrest  Foot positioning: ROM Concerns: Dorsiflexed: []  Right   []  Left Plantar flexed: []  Right    []  Left Inversion: []  Right    []  Left Eversion: []  Right    []  Left  LE Edema: []  1+ (Barely detectable impression when finger is pressed into skin) []  2+ (slight indentation. 15 seconds to rebound) []  3+  (deeper indentation. 30 seconds to rebound) []  4+ (>30 seconds to rebound)  UE Measurements:  UPPER EXTREMITY ROM:   Active ROM Right 08/24/2023 Left 08/24/2023  Shoulder flexion Limited to 90 deg Limited to 90 degrees  Shoulder abduction Limited to 90 deg Limited to 90 degrees  Shoulder adduction    Elbow flexion    Elbow extension    Wrist flexion    Wrist extension    (Blank rows = not tested)  UPPER EXTREMITY MMT:  MMT Right 08/24/2023 Left 08/24/2023  Shoulder flexion 3+ 3+  Shoulder abduction 4 4  Shoulder adduction    Elbow flexion 4 4  Elbow extension 4 4  Wrist flexion    Wrist extension    Pinch  strength    Grip strength WFL WFL  (Blank rows = not tested)  Shoulder Posture:  Right Tendency towards Left  []   Functional []    []   Elevation []    [x]   Depression [x]    [x]   Protraction [x]    []   Retraction []    []   Internal rotation []    []   External rotation []    []   Subluxed []     UE Tone: [x]  Normal []  Flaccid []  Low tone []  Spasticity  []  Dystonia []  Other:   UE Edema: []  1+ (Barely detectable impression when finger is pressed into skin) []  2+ (slight indentation. 15 seconds to rebound) []  3+ (deeper indentation. 30 seconds to rebound) []  4+ (>30 seconds to rebound)  Wrist/Hand: Handedness: [x]  Right   []  Left   []  NA: Comments:  Right  Left  [x]   WNL [x]    []   Limitations []    []   Contractures []    []   Fisting []    []   Tremors []    []   Weak grasp []    []   Poor dexterity []    []   Hand movement non functional []    []   Paralysis []         MOBILITY BASE RECOMMENDATIONS and JUSTIFICATION:  MOBILITY BASE  JUSTIFICATION   Manufacturer:    Model:                              Color:  Seat Width:   Seat Depth:     []  Manual mobility base (continue below)   []  Scooter/POV  []  Power mobility base   Number of hours per day spent in above selected mobility base: 16 hours  Typical daily mobility base use Schedule: Patient will  utilize his power wheelchair to perform all of his functional mobility in the home. It will allow him to travel between rooms of his home safely and efficiently in order to complete all of his MRADLs. Pt is able to perform squat pivot transfers mod I with his LLE prosthetic donned. Due to impaired endurance and history of a L shoulder injury he is also unable to safely and efficiently propel himself in a manual wheelchair. He is also unable to perform adequate pressure relief throughout the day and would require the power tilt feature of a power wheelchair in order to perform adequate pressure relief.   []  is not a safe, functional ambulator  []  limitation prevents from completing a MRADL(s) within a reasonable time frame    []  limitation places at high risk of morbidity or mortality secondary to  the attempts to perform a    MRADL(s)  []  limitation prevents accomplishing a MRADL(s) entirely  []  provide independent mobility  []  equipment is a lifetime medical need  []  walker or cane inadequate  []  any type manual wheelchair      inadequate  []  scooter/POV inadequate      []  requires dependent mobility            POWER MOBILITY      []  Scooter/POV    []  can safely operate   []  can safely transfer   []  has adequate trunk stability   []  cannot functionally propel  manual wheelchair    []  Power mobility base    []  non-ambulatory   []  cannot functionally propel manual wheelchair   []  cannot functionally and safely      operate scooter/POV  []  can safely  operate power       wheelchair  []  home is accessible  []  willing to use power wheelchair     Tilt  []  Powered tilt on powered chair  []  Powered tilt on manual chair  []  Manual tilt on manual chair Comments:  []  change position for pressure      []  elief/cannot weight shift   []  change position against      gravitational force on head and      shoulders   []  decrease pain  []  blood pressure management   []  control autonomic  dysreflexia  []  decrease respiratory distress  []  management of spasticity  []  management of low tone  []  facilitate postural control   []  rest periods   []  control edema  []  increase sitting tolerance   []  aid with transfers     Recline   []  Power recline on power chair  []  Manual recline on manual chair  Comments:    []  intermittent catheterization  []  manage spasticity  []  accommodate femur to back angle  []  change position for pressure relief/cannot weight shift rhigh risk of pressure sore development  []  tilt alone does not accomplish     effective pressure relief, maximum pressure relief achieved at -      _______ degrees tilt   _______ degrees recline   []  difficult to transfer to and from bed []  rest periods and sleeping in chair  []  repositioning for transfers  []  bring to full recline for ADL care  []  clothing/diaper changes in chair  []  gravity PEG tube feeding  []  head positioning  []  decrease pain  []  blood pressure management   []  control autonomic dysreflexia  []  decrease respiratory distress  []  user on ventilator     Elevator on mobility base  []  Power wheelchair  []  Scooter  []  increase Indep in transfers   []  increase Indep in ADLs    []  bathroom function and safety  []  kitchen/cooking function and safety  []  shopping  []  raise height for communication at standing level  []  raise height for eye contact which reduces cervical neck strain and pain  []  drive at raised height for safety and navigating crowds  []  Other:   []  Vertical position system  (anterior tilt)     (Drive locks-out)    []  Stand       (Drive enabled)  []  independent weight bearing  []  decrease joint contractures  []  decrease/manage spasticity  []  decrease/manage spasms  []  pressure distribution away from   scapula, sacrum, coccyx, and ischial tuberosity  []  increase digestion and elimination   []  access to counters and cabinets  []  increase reach  []  increase interaction with  others at eye level, reduces neck strain  []  increase performance of       MRADL(s)      Power elevating legrest    []  Center mount (Single) 85-170 degrees       []  Standard (Pair) 100-170 degrees  []  position legs at 90 degrees, not available with std power ELR  []  center mount tucks into chair to decrease turning radius in home, not available with std power ELR  []  provide change in position for LE  []  elevate legs during recline    []  maintain placement of feet on      footplate  []  decrease edema  []  improve circulation  []  actuator needed to elevate legrest  []  actuator needed to articulate legrest preventing  knees from flexing  []  Increase ground clearance over      curbs  []   STD (pair) independently                     elevate legrest   POWER WHEELCHAIR CONTROLS      Controls/input device  []  Expandable  []  Non-expandable  []  Proportional  []  Right Hand []  Left Hand  []  Non-proportional/switches/head-array  []  Electrical/proximity         []   Mechanical      Manufacturer:___________________   Type:________________________ []  provides access for controlling wheelchair  []  programming for accurate control  []  progressive disease/changing condition  []  required for alternative drive      controls       []  lacks motor control to operate  proportional drive control  []  unable to understand proportional controls  []  limited movement/strength  []  extraneous movement / tremors / ataxic / spastic       []  Upgraded electronics controller/harness    []  Single power (tilt or recline)   []  Expandable    []  Non-expandable plus   []  Multi-power (tilt, recline, power legrest, power seat lift, vertical positioning system, stand)  []  allows input device to communicate with drive motors  []  harness provides necessary connections between the controller, input device, and seat functions     []  needed in order to operate power seat functions through joystick/ input device  []   required for alternative drive controls     []  Enhanced display  []  required to connect all alternative drive controls   []  required for upgraded joystick      (lite-throw, heavy duty, micro)  []  Allows user to see in which mode and drive the wheelchair is set; necessary for alternate controls       []  Upgraded tracking electronics  []  correct tracking when on uneven surfaces makes switch driving more efficient and less fatiguing  []  increase safety when driving  []  increase ability to traverse thresholds    []  Safety / reset / mode switches     Type:    []  Used to change modes and stop the wheelchair when driving     []  Mount for joystick / input device/switches  []  swing away for access or transfers   []  attaches joystick / input device / switches to wheelchair   []  provides for consistent access  []  midline for optimal placement    []  Attendant controlled joystick plus     mount  []  safety  []  long distance driving  []  operation of seat functions  []  compliance with transportation regulations    []  Battery  []  required to power (power assist / scooter/ power wc / other):   []  Power inverter (24V to 12V)  []  required for ventilator / respiratory equipment / other:     CHAIR OPTIONS MANUAL & POWER      Armrests   []  adjustable height []  removable  []  swing away []  fixed  []  flip back  []  reclining  []  full length pads []  desk []  tube arms []  gel pads  []  provide support with elbow at 90    []  remove/flip back/swing away for  transfers  []  provide support and positioning of upper body    []  allow to come closer to table top  []  remove for access to tables  []  provide support for w/c tray  []  change of height/angles for variable activities   []  Elbow  support / Elbow stop  []  keep elbow positioned on arm pad  []  keep arms from falling off arm pad  during tilt and/or recline   Upper Extremity Support  []  Arm trough  []   R  []   L  Style:  []  swivel mount []  fixed mount   []   posterior hand support  []   tray  []  full tray  []  joystick cut out  []   R  []   L  Style:  []  decrease gravitational pull on      shoulders  []  provide support to increase UE  function  []  provide hand support in natural    position  []  position flaccid UE  []  decrease subluxation    []  decrease edema       []  manage spasticity   []  provide midline positioning  []  provide work surface  []  placement for AAC/ Computer/ EADL       Hangers/ Legrests   []  ______ degree  []  Elevating []  articulating  []  swing away []  fixed []  lift off  []  heavy duty  []  adjustable knee angle  []  adjustable calf panel   []  longer extension tube              []  provide LE support  []  maintain placement of feet on      footplate   []  accommodate lower leg length  []  accommodate to hamstring       tightness  []  enable transfers  []  provide change in position for LE's  []  elevate legs during recline    []  decrease edema  []  durability      Foot support   []  footplate []  R []  L []  flip up           []  Depth adjustable   []  angle adjustable  []  foot board/one piece    []  provide foot support  []  accommodate to ankle ROM  []  allow foot to go under wheelchair base  []  enable transfers     []  Shoe holders  []  position foot    []  decrease / manage spasticity  []  control position of LE  []  stability    []  safety     []  Ankle strap/heel      loops  []  support foot on foot support  []  decrease extraneous movement  []  provide input to heel   []  protect foot     []  Amputee adapter []  R  []  L     Style:                  Size:  []  Provide support for stump/residual extremity    []  Transportation tie-down  []  to provide crash tested tie-down brackets    []  Crutch/cane holder    []  O2 holder    []  IV hanger   []  Ventilator tray/mount    []  stabilize accessory on wheelchair       Component  Justification     []  Seat cushion      []  accommodate impaired sensation  []  decubitus ulcers present or  history  []  unable to shift weight  []  increase pressure distribution  []  prevent pelvic extension  []  custom required "off-the-shelf"    seat cushion will not accommodate deformity  []  stabilize/promote pelvis alignment  []  stabilize/promote femur alignment  []  accommodate obliquity  []  accommodate multiple deformity  []  incontinent/accidents  []  low maintenance     []  seat mounts                 []   fixed []  removable  []  attach seat platform/cushion to wheelchair frame    []  Seat wedge    []  provide increased aggressiveness of seat shape to decrease sliding  down in the seat  []  accommodate ROM        []  Cover replacement   []  protect back or seat cushion  []  incontinent/accidents    []  Solid seat / insert    []  support cushion to prevent      hammocking  []  allows attachment of cushion to mobility base    []  Lateral pelvic/thigh/hip     support (Guides)     []  decrease abduction  []  accommodate pelvis  []  position upper legs  []  accommodate spasticity  []  removable for transfers     []  Lateral pelvic/thigh      supports mounts  []  fixed   []  swing-away   []  removable  []  mounts lateral pelvic/thigh supports     []  mounts lateral pelvic/thigh supports swing-away or removable for transfers    []  Medial thigh support (Pommel)  [] decrease adduction  [] accommodate ROM  []  remove for transfers   []  alignment      []  Medial thigh   []  fixed      support mounts      []  swing-away   []  removable  []  mounts medial thigh supports   []  Mounts medial supports swing- away or removable for transfers       Component  Justification   []  Back       []  provide posterior trunk support []  facilitate tone  []  provide lumbar/sacral support []  accommodate deformity  []  support trunk in midline   []  custom required "off-the-shelf" back support will not accommodate deformity   []  provide lateral trunk support []  accommodate or decrease tone            []  Back mounts  []  fixed  []  removable   []  attach back rest/cushion to wheelchair frame   []  Lateral trunk      supports  []  R []  L  []  decrease lateral trunk leaning  []  accommodate asymmetry    []  contour for increased contact  []  safety    []  control of tone    []  Lateral trunk      supports mounts  []  fixed  []  swing-away   []  removable  []  mounts lateral trunk supports     []  Mounts lateral trunk supports swing-away or removable for transfers   []  Anterior chest      strap, vest     []  decrease forward movement of shoulder  []  decrease forward movement of trunk  []  safety/stability  []  added abdominal support  []  trunk alignment  []  assistance with shoulder control   []  decrease shoulder elevation    []  Headrest      []  provide posterior head support  []  provide posterior neck support  []  provide lateral head support  []  provide anterior head support  []  support during tilt and recline  []  improve feeding     []  improve respiration  []  placement of switches  []  safety    []  accommodate ROM   []  accommodate tone  []  improve visual orientation   []  Headrest           []  fixed []  removable []  flip down      Mounting hardware   []  swing-away laterals/switches  []  mount headrest   []  mounts headrest flip down or  removable  for transfers  []  mount headrest swing-away laterals   []  mount switches     []  Neck Support    []  decrease neck rotation  []  decrease forward neck flexion   Pelvic Positioner    []  std hip belt          []  padded hip belt  []  dual pull hip belt  []  four point hip belt  []  stabilize tone  []  decrease falling out of chair  []  prevent excessive extension  []  special pull angle to control      rotation  []  pad for protection over boney   prominence  []  promote comfort    []  Essential needs        bag/pouch   []  medicines []  special food rorthotics []  clothing changes  []  diapers  []  catheter/hygiene []  ostomy supplies   The above equipment has a life- long use expectancy.  Growth and  changes in medical and/or functional conditions would be the exceptions.   SUMMARY:  Why mobility device was selected; include why a lower level device is not appropriate: Patient requires use of a power wheelchair in order to perform safe and independent mobility in his home and in order to perform his MRADLs in a timely manner. Due to his impaired endurance and history of a L shoulder injury and bilateral BKAs he is unable to safely and efficiently propel himself in a manual wheelchair without putting significant strain on his shoulder joints and putting himself at risk for injury. Additionally, due to his history of B BKAs he is unable to stand or perform ambulation, therefore requiring power wheeled mobility. He also is unable to perform adequate pressure relief throughout the day by either performing anterior or lateral leans or wheelchair push-ups due to impaired UE strength and ROM and needs the power tilt feature of a power wheelchair to perform safe and adequate pressure relief throughout the day and prevent another wound to his sacrum.  ASSESSMENT:  CLINICAL IMPRESSION: Patient is a 57 y.o. male who was seen today for physical therapy evaluation for a new power wheelchair. Of note, after discussion with wheelchair vendor from [facility] patient has elected to not proceed with obtaining a new power wheelchair at this time.   OBJECTIVE IMPAIRMENTS decreased activity tolerance, decreased endurance, decreased mobility, difficulty walking, decreased ROM, decreased strength, impaired perceived functional ability, impaired UE functional use, improper body mechanics, postural dysfunction, prosthetic dependency , and pain.   ACTIVITY LIMITATIONS carrying, lifting, bending, sitting, standing, transfers, and locomotion level  PARTICIPATION LIMITATIONS: meal prep, cleaning, laundry, driving, shopping, and community activity  CLINICAL DECISION MAKING: Stable/uncomplicated  EVALUATION COMPLEXITY: High                                    GOALS: One time visit. No goals established.    PLAN: PT FREQUENCY: one time visit    Peter Congo, PT Peter Congo, PT, DPT, CSRS  08/24/2023, 1:03 PM    I concur with the above findings and recommendations of the therapist:  Physician name printed:         Physician's signature:      Date:

## 2023-08-26 ENCOUNTER — Ambulatory Visit (INDEPENDENT_AMBULATORY_CARE_PROVIDER_SITE_OTHER): Payer: Medicare HMO | Admitting: Orthopedic Surgery

## 2023-08-26 DIAGNOSIS — Z89512 Acquired absence of left leg below knee: Secondary | ICD-10-CM

## 2023-08-26 DIAGNOSIS — Z89511 Acquired absence of right leg below knee: Secondary | ICD-10-CM | POA: Diagnosis not present

## 2023-08-26 DIAGNOSIS — M79661 Pain in right lower leg: Secondary | ICD-10-CM | POA: Diagnosis not present

## 2023-08-29 ENCOUNTER — Encounter: Payer: Self-pay | Admitting: Orthopedic Surgery

## 2023-08-29 NOTE — Progress Notes (Signed)
Office Visit Note   Patient: [name]           Date of Birth: 02-06-1966           MRN: [id number] Visit Date: 08/26/2023              Requested by: Lula Olszewski, MD [address] [place],  [place] [address] PCP: Lula Olszewski, MD  Chief Complaint  Patient presents with   Right Leg - Follow-up    Hx BKA MRI review       HPI: Patient is a 57 year old gentleman status post bilateral transtibial amputation.  Patient has been having increasing pain swelling and skin color changes in the right residual limb and he is status post an MRI scan.  Assessment & Plan: Visit Diagnoses:  1. Pain in right lower leg   2. S/P bilateral below knee amputation (HCC)     Plan: Patient is a K3 level ambulator.  He will need a new prosthesis.  Patient states he does not want to follow-up with [facility].  Patient was provided a size large shrinker that he is to wear daily.  He will follow-up with Dr. Horald Chestnut for his degenerative disc disease.  He is currently using a scooter.  Follow-Up Instructions: Return in about 4 weeks (around 09/23/2023).   Ortho Exam  Patient is alert, oriented, no adenopathy, well-dressed, normal affect, normal respiratory effort. Examination patient has increased venous congestion in the right lower extremity.  The redness resolves with massage.  There are no open ulcers there is no tenderness to palpation.  Review the MRI scan shows no osteomyelitis or abscess.  It does show edema.  Imaging: No results found. No images are attached to the encounter.  Labs: Lab Results  Component Value Date   HGBA1C 8.1 (H) 06/24/2023   HGBA1C 7.1 (H) 12/23/2022   HGBA1C 11.9 (H) 06/15/2022   ESRSEDRATE 25 (H) 07/03/2022   ESRSEDRATE 11 12/30/2021   ESRSEDRATE 12 12/29/2021   CRP <1.0 07/03/2022   CRP 0.7 12/30/2021   CRP 1.1 (H) 12/29/2021   REPTSTATUS 04/11/2022 FINAL 04/09/2022   CULT >=100,000 COLONIES/mL PSEUDOMONAS AERUGINOSA (A) 04/09/2022   LABORGA  PSEUDOMONAS AERUGINOSA (A) 04/09/2022     Lab Results  Component Value Date   ALBUMIN 4.0 07/27/2023   ALBUMIN 4.0 06/24/2023   ALBUMIN 4.2 12/23/2022    Lab Results  Component Value Date   MG 1.7 12/18/2021   MG 1.4 (L) 12/16/2021   MG 1.5 (L) 08/15/2021   No results found for: "VD25OH"  No results found for: "PREALBUMIN"    Latest Ref Rng & Units 07/27/2023    2:12 PM 06/24/2023    2:11 PM 12/23/2022    2:23 PM  CBC EXTENDED  WBC 4.0 - 10.5 K/uL 7.4  6.6  6.2   RBC 4.22 - 5.81 MIL/uL 6.20  6.14  6.27   Hemoglobin 13.0 - 17.0 g/dL 19.1  47.8 Repeated and verified X2.  19.4 Repeated and verified X2.   HCT 39.0 - 52.0 % 55.0  56.9  56.6 Repeated and verified X2.   Platelets 150 - 400 K/uL 201  210.0  193.0   NEUT# 1.7 - 7.7 K/uL 5.7  5.4  4.7   Lymph# 0.7 - 4.0 K/uL 0.9  0.6  0.7      There is no height or weight on file to calculate BMI.  Orders:  No orders of the defined types were placed in this encounter.  No  orders of the defined types were placed in this encounter.    Procedures: No procedures performed  Clinical Data: No additional findings.  ROS:  All other systems negative, except as noted in the HPI. Review of Systems  Objective: Vital Signs: There were no vitals taken for this visit.  Specialty Comments:  No specialty comments available.  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   Malaise 06/22/2022   Erythrocytosis 06/22/2022   Amputation below knee (HCC) 04/23/2022   PTSD (post-traumatic stress disorder) 04/23/2022   Generalized anxiety disorder 01/21/2022   Stage 4 skin ulcer of sacral region (HCC) 12/29/2021   Episode of unresponsiveness 12/15/2021   Renal  mass 12/15/2021   Stage IV pressure ulcer of sacral region (HCC) 09/15/2021   Insomnia 09/14/2021   Chronic systolic CHF (congestive heart failure) (HCC) 09/14/2021   Hypogonadism in male 09/14/2021   Chronic pain 09/14/2021   Pseudohyponatremia 09/13/2021   Normocytic anemia 09/13/2021   Discitis of thoracic region    Paroxysmal atrial flutter (HCC) 07/22/2021   Chronic diastolic (congestive) heart failure (HCC) 05/16/2021   Lumbar spondylosis 03/25/2021   H/O amputation of leg through tibia and fibula (HCC) 11/10/2019   Degeneration of lumbar intervertebral disc 11/10/2019   Hypercholesterolemia 11/10/2019   S/P bilateral below knee amputation (HCC) 10/30/2019   Dyslipidemia 10/30/2019   Type 1 diabetes mellitus with diabetic polyneuropathy, with long-term current use of insulin (HCC) 10/12/2019   Hypertensive disorder 09/25/2019   Urinary hesitancy 09/25/2019   Prosthesis adjustments 09/25/2019   Erectile dysfunction 09/25/2019   Palliative care status 09/25/2019   Pulmonary nodules 09/25/2019   Depressive disorder 09/25/2019   Past Medical History:  Diagnosis Date   Acquired complex renal cyst 09/25/2019   Acute respiratory failure with hypoxia (HCC) secondary to suspected aspiration pneumonia 12/15/2021   AKI (acute kidney injury) (HCC) 07/22/2021   Cancer (HCC)    renal   Chest pain 07/22/2021   Complication of anesthesia    Decubitus ulcer of sacral area 12/15/2021   Depression    Diabetes mellitus without complication (HCC)    Diabetic infection of left foot (HCC) 04/17/2021   Episode of unresponsiveness 12/15/2021   Gas gangrene of foot (HCC) 07/22/2021   Healthcare maintenance 09/25/2019   Hematuria 09/25/2019   History of kidney stones    Lactic acidosis 12/15/2021   Leukocytosis 09/13/2021   Osteomyelitis (HCC)    Sacral pressure injury of skin 07/27/2021   Septic shock (HCC) 07/22/2021   Severe sepsis with acute organ dysfunction (HCC) 07/22/2021    Shortness of breath 05/16/2021   Stage 3a chronic kidney disease (HCC) 09/25/2019   Systolic dysfunction    Type 2 diabetes mellitus 10/12/2019    Family History  Problem Relation Age of Onset   Anxiety disorder Mother    Cancer Father    Coronary artery disease Father    Sleep apnea Neg Hx     Past Surgical History:  Procedure Laterality Date   AMPUTATION Left 07/23/2021   Procedure: LEFT BELOW KNEE AMPUTATION;  Surgeon: Nadara Mustard, MD;  Location: [facility] OR;  Service: Orthopedics;  Laterality: Left;   BELOW KNEE LEG AMPUTATION Right    BUBBLE STUDY  07/29/2021   Procedure: BUBBLE STUDY;  Surgeon: Quintella Reichert, MD;  Location: [facility] ENDOSCOPY;  Service: Cardiovascular;;   CARDIOVERSION N/A 07/29/2021   Procedure: CARDIOVERSION;  Surgeon: Quintella Reichert, MD;  Location[facility] ENDOSCOPY;  Service: Cardiovascular;  Laterality: N/A;   RADIOLOGY WITH ANESTHESIA N/A 08/05/2021   Procedure: MRI LUMBAR WITH AND WITHOUT; THORASIC SPINE WITH AND WITHOUT WITH ANESTHESIA;  Surgeon: Radiologist, Medication, MD;  Location: [facility] OR;  Service: Radiology;  Laterality: N/A;   RADIOLOGY WITH ANESTHESIA N/A 08/07/2021   Procedure: MRI WITH LUMBER WITH AND WITHOUT CONTRAST,THORACIC WITH AND WITHOUT CONTRAST;  Surgeon: Radiologist, Medication, MD;  Location: [facility] OR;  Service: Radiology;  Laterality: N/A;   RADIOLOGY WITH ANESTHESIA N/A 09/13/2021   Procedure: MRI WITH ANESTHESIA;  Surgeon: Julieanne Cotton, MD;  Location: [facility] OR;  Service: Radiology;  Laterality: N/A;   TEE WITHOUT CARDIOVERSION N/A 07/29/2021   Procedure: TRANSESOPHAGEAL ECHOCARDIOGRAM (TEE);  Surgeon: Quintella Reichert, MD;  Location: [facility] ENDOSCOPY;  Service: Cardiovascular;  Laterality: N/A;   Social History   Occupational History   Not on file  Tobacco Use   Smoking status: Never   Smokeless tobacco: Current    Types: Chew  Vaping Use   Vaping status: Never Used  Substance and Sexual Activity   Alcohol use: Not Currently   Drug use:  Never   Sexual activity: Not Currently

## 2023-08-31 DIAGNOSIS — G546 Phantom limb syndrome with pain: Secondary | ICD-10-CM | POA: Diagnosis not present

## 2023-08-31 DIAGNOSIS — M5459 Other low back pain: Secondary | ICD-10-CM | POA: Diagnosis not present

## 2023-08-31 DIAGNOSIS — E1101 Type 2 diabetes mellitus with hyperosmolarity with coma: Secondary | ICD-10-CM | POA: Diagnosis not present

## 2023-09-14 DIAGNOSIS — D49512 Neoplasm of unspecified behavior of left kidney: Secondary | ICD-10-CM | POA: Diagnosis not present

## 2023-09-19 NOTE — Progress Notes (Signed)
 Office Visit    Patient Name: [name] Date of Encounter: 09/20/2023  PCP:  Jesus Bernardino MATSU, MD   [facility]  Cardiologist:  Jerel Balding, MD  Advanced Practice Provider:  No care team member to display Electrophysiologist:  None   Chief Complaint    [name] is a 58 y.o. 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 Shelah after chest CT disclosed the presence of multivessel CAD and cardiomegaly Presents today for follow-up visit.  He was last seen 05/16/2021 and was describing symptoms consistent with CHF.  He was having exertional dyspnea and swelling in his amputation stump.  His BNP was elevated at 235.  His echocardiogram showed borderline LV function with EF 50 to 55% but there was echo evidence of elevated left atrial filling pressures, the left atrium was severely dilated, there was no meaningful valvular abnormalities.  Patient improved after treatment with diuretics.  Creatinine increased from 1.57-1.81.  We will try decreasing his dose of furosemide  but his shortness of breath and swelling at his stump have increased again.  He denies any chest pain.  Noncontrast CT performed 04/2021 showed cardiomegaly, three-vessel CAD and coarsened nodular liver contour suggestive of cirrhosis.  He is skeptical of any medications in general.  His father had bypass surgery at age 46 and later went on to undergo a redo bypass surgery.  Patient made it clear that he will never undergo major surgery.  He is being monitored for a mass in his right kidney by his urologist, Dr. Cam suspects to represent renal cell carcinoma.  He does not want any surgery for this condition.  I saw him back 04/2022, he tells me that he would like to set the record straight and that he  did not have osteomyelitis with his original amputation.  He believes that this was a medical error.  His most recent amputation was done November 2022 on the left done by Dr. Harden.  He has not had any chest pain.  He has had some shortness of breath with this and about the same.  He has a past history of syncope but he was told his oxygen was low at the time and this was the reason he passed out.  He has 2 prosthetic limbs in place today and he does not do too much activity.  He does move from the bed to the wheelchair and sometimes has shortness of breath with this movement.  He does take Lasix  20 mg daily.  He states that 40 mg daily was too much for him and he was losing a lot of weight.  He states his weight has been stable on 20 mg daily and he has not had any swelling or increased shortness of breath.  He is currently on Cipro  antibiotic for a UTI.  He has a catheter in place today.  He is hoping once his sacral wound heals he will be able to get the catheter removed and get off of antibiotics.  From a heart standpoint, he has been doing well.  Today, he presents a double amputee with a history of heart failure and diabetes with ongoing issues related to his prosthetic leg and back pain. He reports that his prosthetic leg was not fitting properly, leading to inflammation of a nerve in his  right leg. He has since stopped using this and is looking for another provider. He also reports a tumor on his kidney that has grown in size. Due to his status as a double amputee, traditional surgery is not an option, and he is considering a scope surgery. He expresses a strong preference against receiving care at [facility] due to a previous negative experience with a stage four bed sore.  The patient also reports a history of a stage four bed sore that took eighteen months to heal and still causes discomfort. He has been managing his heart failure with 20mg  of Lasix  daily, which he reports has been effective. He  occasionally experiences shortness of breath, but denies any new chest pain. He also reports that his blood sugar levels have been stable, with his last A1C reading at 8.  The patient's back pain is attributed to advanced degenerative disc disease in his L1 through L5 vertebrae. He is scheduled for an MRI to further evaluate this issue. He also reports ongoing pain from a previous shoulder injury.  Reports no chest pain, pressure, or tightness. No edema, orthopnea, PND. Reports no palpitations.   Discussed the use of AI scribe software for clinical note transcription with the patient, who gave verbal consent to proceed.  Past Medical History    Past Medical History:  Diagnosis Date   Acquired complex renal cyst 09/25/2019   Acute respiratory failure with hypoxia (HCC) secondary to suspected aspiration pneumonia 12/15/2021   AKI (acute kidney injury) (HCC) 07/22/2021   Cancer (HCC)    renal   Chest pain 07/22/2021   Complication of anesthesia    Decubitus ulcer of sacral area 12/15/2021   Depression    Diabetes mellitus without complication (HCC)    Diabetic infection of left foot (HCC) 04/17/2021   Episode of unresponsiveness 12/15/2021   Gas gangrene of foot (HCC) 07/22/2021   Healthcare maintenance 09/25/2019   Hematuria 09/25/2019   History of kidney stones    Lactic acidosis 12/15/2021   Leukocytosis 09/13/2021   Osteomyelitis (HCC)    Sacral pressure injury of skin 07/27/2021   Septic shock (HCC) 07/22/2021   Severe sepsis with acute organ dysfunction (HCC) 07/22/2021   Shortness of breath 05/16/2021   Stage 3a chronic kidney disease (HCC) 09/25/2019   Systolic dysfunction    Type 2 diabetes mellitus 10/12/2019   Past Surgical History:  Procedure Laterality Date   AMPUTATION Left 07/23/2021   Procedure: LEFT BELOW KNEE AMPUTATION;  Surgeon: Harden Jerona GAILS, MD;  Location: [facility] OR;  Service: Orthopedics;  Laterality: Left;   BELOW KNEE LEG AMPUTATION Right    BUBBLE STUDY   07/29/2021   Procedure: BUBBLE STUDY;  Surgeon: Shlomo Wilbert SAUNDERS, MD;  Location: [facility] ENDOSCOPY;  Service: Cardiovascular;;   CARDIOVERSION N/A 07/29/2021   Procedure: CARDIOVERSION;  Surgeon: Shlomo Wilbert SAUNDERS, MD;  Location: [facility] ENDOSCOPY;  Service: Cardiovascular;  Laterality: N/A;   RADIOLOGY WITH ANESTHESIA N/A 08/05/2021   Procedure: MRI LUMBAR WITH AND WITHOUT; THORASIC SPINE WITH AND WITHOUT WITH ANESTHESIA;  Surgeon: Radiologist, Medication, MD;  Location: [facility] OR;  Service: Radiology;  Laterality: N/A;   RADIOLOGY WITH ANESTHESIA N/A 08/07/2021   Procedure: MRI WITH LUMBER WITH AND WITHOUT CONTRAST,THORACIC WITH AND WITHOUT CONTRAST;  Surgeon: Radiologist, Medication, MD;  Location: [facility] OR;  Service: Radiology;  Laterality: N/A;   RADIOLOGY WITH ANESTHESIA N/A 09/13/2021   Procedure: MRI WITH ANESTHESIA;  Surgeon: Dolphus Carrion, MD;  Location: [facility] OR;  Service: Radiology;  Laterality: N/A;  TEE WITHOUT CARDIOVERSION N/A 07/29/2021   Procedure: TRANSESOPHAGEAL ECHOCARDIOGRAM (TEE);  Surgeon: Shlomo Wilbert SAUNDERS, MD;  Location: [facility] ENDOSCOPY;  Service: Cardiovascular;  Laterality: N/A;    Allergies  Allergies  Allergen Reactions   Bactrim [Sulfamethoxazole-Trimethoprim] Other (See Comments)    Effect Kidney   Ciprofloxacin  Other (See Comments)    Kidney function    Levaquin [Levofloxacin] Other (See Comments)    Effect kidney function    EKGs/Labs/Other Studies Reviewed:   The following studies were reviewed today:  Coronary CTA 05/23/2021  IMPRESSION: 1. Coronary calcium  score of 243. This was 90 percentile for age and sex matched control.   2. Normal coronary origin with right dominance.   3. Diffuse calcified plaque of LAD, RCA. There is significant stenosis of mid first diagonal branch, appears too small for PCI (FFR 0.64).   4.  Aortic atherosclerosis.  Echocardiogram 07/29/2021  IMPRESSIONS     1. Left ventricular ejection fraction, by estimation, is 35 to 40%. The   left ventricle has moderately decreased function. The left ventricle  demonstrates global hypokinesis.   2. Right ventricular systolic function is moderately reduced. The right  ventricular size is normal.   3. Left atrial size was mildly dilated. No left atrial/left atrial  appendage thrombus was detected.   4. Right atrial size was mildly dilated.   5. The mitral valve is normal in structure. Trivial mitral valve  regurgitation. No evidence of mitral stenosis.   6. The aortic valve is normal in structure. Aortic valve regurgitation is  trivial. No aortic stenosis is present.   7. The inferior vena cava is normal in size with greater than 50%  respiratory variability, suggesting right atrial pressure of 3 mmHg.   8. Cannot exclude a small PFO.   Conclusion(s)/Recommendation(s): Normal biventricular function without  evidence of hemodynamically significant valvular heart disease.   FINDINGS   Left Ventricle: Left ventricular ejection fraction, by estimation, is 35  to 40%. The left ventricle has moderately decreased function. The left  ventricle demonstrates global hypokinesis. The left ventricular internal  cavity size was normal in size.  There is no left ventricular hypertrophy.   Right Ventricle: The right ventricular size is normal. No increase in  right ventricular wall thickness. Right ventricular systolic function is  moderately reduced.   Left Atrium: Left atrial size was mildly dilated. No left atrial/left  atrial appendage thrombus was detected.   Right Atrium: Right atrial size was mildly dilated.   Pericardium: There is no evidence of pericardial effusion.   Mitral Valve: The mitral valve is normal in structure. There is mild  calcification of the posterior mitral valve leaflet(s). Trivial mitral  valve regurgitation. No evidence of mitral valve stenosis.   Tricuspid Valve: The tricuspid valve is normal in structure. Tricuspid  valve regurgitation is mild . No  evidence of tricuspid stenosis.   Aortic Valve: The aortic valve is normal in structure. Aortic valve  regurgitation is trivial. No aortic stenosis is present.   Pulmonic Valve: The pulmonic valve was normal in structure. Pulmonic valve  regurgitation is not visualized. No evidence of pulmonic stenosis.   Aorta: The aortic root is normal in size and structure.   Venous: The inferior vena cava is normal in size with greater than 50%  respiratory variability, suggesting right atrial pressure of 3 mmHg.   IAS/Shunts: There is redundancy of the interatrial septum. Cannot exclude  a small PFO. Agitated saline contrast was given intravenously to evaluate  for intracardiac shunting.  EKG:  EKG is not ordered today.   Recent Labs: 06/24/2023: TSH 2.340 07/27/2023: ALT 45; BUN 18; Creatinine, Ser 1.19; Hemoglobin 18.5; Platelets 201; Potassium 4.2; Sodium 139  Recent Lipid Panel    Component Value Date/Time   CHOL 105 06/24/2023 1411   CHOL 183 12/05/2021 1158   TRIG 127 06/24/2023 1411   HDL 37 (L) 06/24/2023 1411   HDL 39 (L) 12/05/2021 1158   CHOLHDL 2.8 06/24/2023 1411   VLDL 28.2 12/23/2022 1423   LDLCALC 47 06/24/2023 1411    Home Medications   Current Meds  Medication Sig   ACCU-CHEK GUIDE test strip USE UP TO SIX TIMES DAILY TO CHECK BLOOD SUGARS   aspirin  EC 325 MG tablet Take 1 tablet (325 mg total) by mouth daily.   baclofen  (LIORESAL ) 10 MG tablet TAKE 1 TABLET BY MOUTH THREE TIMES DAILY   blood glucose meter kit and supplies KIT Dispense based on patient and insurance preference. Use up to four times daily as directed.   celecoxib  (CELEBREX ) 100 MG capsule Take 1 capsule by mouth twice daily   furosemide  (LASIX ) 20 MG tablet TAKE 1 TABLET BY MOUTH ONCE DAILY . APPOINTMENT REQUIRED FOR FUTURE REFILLS   insulin  NPH-regular Human (NOVOLIN  70/30 RELION) (70-30) 100 UNIT/ML injection Inject 15 Units into the skin 2 (two) times daily with a meal. (Patient taking  differently: Inject 3-12 Units into the skin 2 (two) times daily with a meal. Sliding scale)   lisinopril  (ZESTRIL ) 10 MG tablet Take 1 tablet (10 mg total) by mouth daily. KEEP JAN. OV.   Oxycodone  HCl 10 MG TABS Take 10 mg by mouth 3 (three) times daily as needed (Pain).   pregabalin (LYRICA) 75 MG capsule Take 75 mg by mouth 2 (two) times daily.   rosuvastatin  (CRESTOR ) 20 MG tablet Take 1 tablet by mouth once daily   sildenafil  (VIAGRA ) 100 MG tablet Take 100 mg by mouth daily as needed.   tamsulosin  (FLOMAX ) 0.4 MG CAPS capsule Take 0.4 mg by mouth daily.   testosterone  cypionate (DEPO-TESTOSTERONE ) 200 MG/ML injection Inject 1 mL (200 mg total) into the muscle every 14 (fourteen) days. Significant Risks of heart attack, stroke with continued use. Reducing dose as tolerated highly recommended     Review of Systems      All other systems reviewed and are otherwise negative except as noted above.  Physical Exam    VS:  BP 112/76   Pulse (!) 110   Ht 6' 3 (1.905 m)   Wt 220 lb (99.8 kg)   SpO2 97%   BMI 27.50 kg/m  , BMI Body mass index is 27.5 kg/m.  Wt Readings from Last 3 Encounters:  09/20/23 220 lb (99.8 kg)  08/11/23 228 lb (103.4 kg)  03/22/23 202 lb (91.6 kg)     GEN: Well nourished, well developed, in no acute distress. HEENT: normal. Neck: Supple, no JVD, carotid bruits, or masses. Cardiac: RRR, no murmurs, rubs, or gallops. No clubbing, cyanosis, edema.  Radials/PT 2+ and equal bilaterally.  Respiratory:  Respirations regular and unlabored, clear to auscultation bilaterally. GI: Soft, nontender, nondistended. MS: No deformity or atrophy. Skin: Warm and dry, no rash. Neuro:  Strength and sensation are intact. Psych: Normal affect.  Assessment & Plan    Renal Mass   Increase in size of renal mass. Discussed potential for surgical intervention via scope surgery due to patient's status as a double amputee. Patient expressed preference for care team and location.    -  Continue discussions with Dr. Cam regarding surgical intervention options.  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 Patient has sought help from a new prosthetic provider.   -Continue follow-up with new prosthetic provider and Dr. Harden.  Congestive Heart Failure   Stable with Lasix  20mg  daily. No new symptoms of chest pain or shortness of breath. EKG showed occasional PACs, but not at a concerning frequency.   -Continue Lasix  20mg  daily.   -Last echocardiogram showed LVEF 35% which was back in 2022 -Order echocardiogram to check heart function given it has been almost three years since last assessment.  Chronic Back Pain   Advanced degenerative disc disease in L1-L5. Pain exacerbated by current immobility due to prosthetic issues.   -Await results of upcoming MRI.  Diabetes Mellitus Type 2   Last A1C was 8. Patient is satisfied with this level given current physical limitations.   -Continue current diabetes management plan.  HTN -Well-controlled today, 112/76 -Continue lisinopril  10 mg daily      Disposition: Follow up 1 year with Jerel Balding, MD or APP.  Signed, Orren LOISE Fabry, PA-C 09/20/2023, 4:04 PM [facility]

## 2023-09-20 ENCOUNTER — Encounter: Payer: Self-pay | Admitting: Physician Assistant

## 2023-09-20 ENCOUNTER — Ambulatory Visit: Payer: Medicare HMO | Attending: Physician Assistant | Admitting: Physician Assistant

## 2023-09-20 VITALS — BP 112/76 | HR 110 | Ht 75.0 in | Wt 220.0 lb

## 2023-09-20 DIAGNOSIS — N2889 Other specified disorders of kidney and ureter: Secondary | ICD-10-CM | POA: Diagnosis not present

## 2023-09-20 DIAGNOSIS — E782 Mixed hyperlipidemia: Secondary | ICD-10-CM | POA: Diagnosis not present

## 2023-09-20 DIAGNOSIS — Z89511 Acquired absence of right leg below knee: Secondary | ICD-10-CM

## 2023-09-20 DIAGNOSIS — I10 Essential (primary) hypertension: Secondary | ICD-10-CM | POA: Diagnosis not present

## 2023-09-20 DIAGNOSIS — N1831 Chronic kidney disease, stage 3a: Secondary | ICD-10-CM

## 2023-09-20 DIAGNOSIS — Z89512 Acquired absence of left leg below knee: Secondary | ICD-10-CM | POA: Diagnosis not present

## 2023-09-20 DIAGNOSIS — T8789 Other complications of amputation stump: Secondary | ICD-10-CM | POA: Diagnosis not present

## 2023-09-20 DIAGNOSIS — I5032 Chronic diastolic (congestive) heart failure: Secondary | ICD-10-CM | POA: Diagnosis not present

## 2023-09-20 DIAGNOSIS — R0602 Shortness of breath: Secondary | ICD-10-CM | POA: Diagnosis not present

## 2023-09-20 NOTE — Patient Instructions (Signed)
 Medication Instructions:  Your physician recommends that you continue on your current medications as directed. Please refer to the Current Medication list given to you today.  *If you need a refill on your cardiac medications before your next appointment, please call your pharmacy*  Lab Work: If you have labs (blood work) drawn today and your tests are completely normal, you will receive your results only by: MyChart Message (if you have MyChart) OR A paper copy in the mail If you have any lab test that is abnormal or we need to change your treatment, we will call you to review the results.  Testing/Procedures: Your physician has requested that you have an echocardiogram. Echocardiography is a painless test that uses sound waves to create images of your heart. It provides your doctor with information about the size and shape of your heart and how well your heart's chambers and valves are working. This procedure takes approximately one hour. There are no restrictions for this procedure. Please do NOT wear cologne, perfume, aftershave, or lotions (deodorant is allowed). Please arrive 15 minutes prior to your appointment time.  Please note: We ask at that you not bring children with you during ultrasound (echo/ vascular) testing. Due to room size and safety concerns, children are not allowed in the ultrasound rooms during exams. Our front office staff cannot provide observation of children in our lobby area while testing is being conducted. An adult accompanying a patient to their appointment will only be allowed in the ultrasound room at the discretion of the ultrasound technician under special circumstances. We apologize for any inconvenience.  Follow-Up: At [facility], you and your health needs are our priority.  As part of our continuing mission to provide you with exceptional heart care, we have created designated Provider Care Teams.  These Care Teams include your primary  Cardiologist (physician) and Advanced Practice Providers (APPs -  Physician Assistants and Nurse Practitioners) who all work together to provide you with the care you need, when you need it.  We recommend signing up for the patient portal called MyChart.  Sign up information is provided on this After Visit Summary.  MyChart is used to connect with patients for Virtual Visits (Telemedicine).  Patients are able to view lab/test results, encounter notes, upcoming appointments, etc.  Non-urgent messages can be sent to your provider as well.   To learn more about what you can do with MyChart, go to [email or web address].    Your next appointment:   1 year(s)  Provider:   Jerel Balding, MD

## 2023-09-21 ENCOUNTER — Ambulatory Visit: Payer: Medicare HMO | Admitting: Orthopedic Surgery

## 2023-09-21 ENCOUNTER — Encounter: Payer: Self-pay | Admitting: Orthopedic Surgery

## 2023-09-21 ENCOUNTER — Other Ambulatory Visit: Payer: Self-pay

## 2023-09-21 DIAGNOSIS — Z89512 Acquired absence of left leg below knee: Secondary | ICD-10-CM

## 2023-09-21 DIAGNOSIS — M79661 Pain in right lower leg: Secondary | ICD-10-CM

## 2023-09-21 DIAGNOSIS — Z89511 Acquired absence of right leg below knee: Secondary | ICD-10-CM

## 2023-09-21 NOTE — Progress Notes (Signed)
 Office Visit Note   Patient: [name]           Date of Birth: Jul 20, 1966           MRN: [id number] Visit Date: 09/21/2023              Requested by: Jesus Bernardino MATSU, MD [address] [place],  [place] [address] PCP: Jesus Bernardino MATSU, MD  Chief Complaint  Patient presents with   Right Leg - Follow-up    Hx BKA       HPI: Patient is a 58 year old gentleman bilateral amputee who has been having dystrophic pain in the right below-knee amputation with skin color and temperature changes.  Patient is currently wearing a for extra-large stump shrinker.  Assessment & Plan: Visit Diagnoses:  1. Pain in right lower leg   2. S/P bilateral below knee amputation (HCC)     Plan: Patient's leg is showing improvement in color and sensitivity to touch.  Patient was given a 2 extra-large stump shrinker.  Follow-Up Instructions: Return in about 4 weeks (around 10/19/2023).   Ortho Exam  Patient is alert, oriented, no adenopathy, well-dressed, normal affect, normal respiratory effort. Examination of the right lower extremity the temperature is normal the color is improving and there is no hypersensitivity to light touch.  There are no ulcers no drainage.  There is good consolidation of the residual limb.  Imaging: No results found. No images are attached to the encounter.  Labs: Lab Results  Component Value Date   HGBA1C 8.1 (H) 06/24/2023   HGBA1C 7.1 (H) 12/23/2022   HGBA1C 11.9 (H) 06/15/2022   ESRSEDRATE 25 (H) 07/03/2022   ESRSEDRATE 11 12/30/2021   ESRSEDRATE 12 12/29/2021   CRP <1.0 07/03/2022   CRP 0.7 12/30/2021   CRP 1.1 (H) 12/29/2021   REPTSTATUS 04/11/2022 FINAL 04/09/2022   CULT >=100,000 COLONIES/mL PSEUDOMONAS AERUGINOSA (A) 04/09/2022   LABORGA PSEUDOMONAS AERUGINOSA (A) 04/09/2022     Lab Results  Component Value Date   ALBUMIN 4.0 07/27/2023   ALBUMIN 4.0 06/24/2023   ALBUMIN 4.2 12/23/2022    Lab Results  Component Value Date   MG 1.7  12/18/2021   MG 1.4 (L) 12/16/2021   MG 1.5 (L) 08/15/2021   No results found for: VD25OH  No results found for: PREALBUMIN    Latest Ref Rng & Units 07/27/2023    2:12 PM 06/24/2023    2:11 PM 12/23/2022    2:23 PM  CBC EXTENDED  WBC 4.0 - 10.5 K/uL 7.4  6.6  6.2   RBC 4.22 - 5.81 MIL/uL 6.20  6.14  6.27   Hemoglobin 13.0 - 17.0 g/dL 81.4  81.1 Repeated and verified X2.  19.4 Repeated and verified X2.   HCT 39.0 - 52.0 % 55.0  56.9  56.6 Repeated and verified X2.   Platelets 150 - 400 K/uL 201  210.0  193.0   NEUT# 1.7 - 7.7 K/uL 5.7  5.4  4.7   Lymph# 0.7 - 4.0 K/uL 0.9  0.6  0.7      There is no height or weight on file to calculate BMI.  Orders:  No orders of the defined types were placed in this encounter.  No orders of the defined types were placed in this encounter.    Procedures: No procedures performed  Clinical Data: No additional findings.  ROS:  All other systems negative, except as noted in the HPI. Review of Systems  Objective: Vital Signs: There were no  vitals taken for this visit.  Specialty Comments:  No specialty comments available.  PMFS History: Patient Active Problem List   Diagnosis Date Noted   Adverse reaction to intramuscular testosterone  08/11/2023   History of atrial fibrillation 08/11/2023   Shoulder arthritis 07/28/2023   Amputation stump pain (HCC) 07/28/2023   Chronic back pain 12/24/2022   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HCC) 07/24/2022   Malaise 06/22/2022   Erythrocytosis 06/22/2022   Amputation below knee (HCC) 04/23/2022   PTSD (post-traumatic stress disorder) 04/23/2022   Generalized anxiety disorder 01/21/2022   Stage 4 skin ulcer of sacral region (HCC) 12/29/2021   Episode of unresponsiveness 12/15/2021   Renal mass 12/15/2021   Stage IV pressure ulcer of sacral region (HCC) 09/15/2021   Insomnia 09/14/2021   Chronic systolic CHF (congestive heart failure) (HCC) 09/14/2021   Hypogonadism in male 09/14/2021    Chronic pain 09/14/2021   Pseudohyponatremia 09/13/2021   Normocytic anemia 09/13/2021   Discitis of thoracic region    Paroxysmal atrial flutter (HCC) 07/22/2021   Chronic diastolic (congestive) heart failure (HCC) 05/16/2021   Lumbar spondylosis 03/25/2021   H/O amputation of leg through tibia and fibula (HCC) 11/10/2019   Degeneration of lumbar intervertebral disc 11/10/2019   Hypercholesterolemia 11/10/2019   S/P bilateral below knee amputation (HCC) 10/30/2019   Dyslipidemia 10/30/2019   Type 1 diabetes mellitus with diabetic polyneuropathy, with long-term current use of insulin  (HCC) 10/12/2019   Hypertensive disorder 09/25/2019   Urinary hesitancy 09/25/2019   Prosthesis adjustments 09/25/2019   Erectile dysfunction 09/25/2019   Palliative care status 09/25/2019   Pulmonary nodules 09/25/2019   Depressive disorder 09/25/2019   Past Medical History:  Diagnosis Date   Acquired complex renal cyst 09/25/2019   Acute respiratory failure with hypoxia (HCC) secondary to suspected aspiration pneumonia 12/15/2021   AKI (acute kidney injury) (HCC) 07/22/2021   Cancer (HCC)    renal   Chest pain 07/22/2021   Complication of anesthesia    Decubitus ulcer of sacral area 12/15/2021   Depression    Diabetes mellitus without complication (HCC)    Diabetic infection of left foot (HCC) 04/17/2021   Episode of unresponsiveness 12/15/2021   Gas gangrene of foot (HCC) 07/22/2021   Healthcare maintenance 09/25/2019   Hematuria 09/25/2019   History of kidney stones    Lactic acidosis 12/15/2021   Leukocytosis 09/13/2021   Osteomyelitis (HCC)    Sacral pressure injury of skin 07/27/2021   Septic shock (HCC) 07/22/2021   Severe sepsis with acute organ dysfunction (HCC) 07/22/2021   Shortness of breath 05/16/2021   Stage 3a chronic kidney disease (HCC) 09/25/2019   Systolic dysfunction    Type 2 diabetes mellitus 10/12/2019    Family History  Problem Relation Age of Onset   Anxiety  disorder Mother    Cancer Father    Coronary artery disease Father    Sleep apnea Neg Hx     Past Surgical History:  Procedure Laterality Date   AMPUTATION Left 07/23/2021   Procedure: LEFT BELOW KNEE AMPUTATION;  Surgeon: Harden Jerona GAILS, MD;  Location: [facility] OR;  Service: Orthopedics;  Laterality: Left;   BELOW KNEE LEG AMPUTATION Right    BUBBLE STUDY  07/29/2021   Procedure: BUBBLE STUDY;  Surgeon: Shlomo Wilbert SAUNDERS, MD;  Location: [facility] ENDOSCOPY;  Service: Cardiovascular;;   CARDIOVERSION N/A 07/29/2021   Procedure: CARDIOVERSION;  Surgeon: Shlomo Wilbert SAUNDERS, MD;  Location: [facility] ENDOSCOPY;  Service: Cardiovascular;  Laterality: N/A;   RADIOLOGY WITH ANESTHESIA N/A 08/05/2021  Procedure: MRI LUMBAR WITH AND WITHOUT; THORASIC SPINE WITH AND WITHOUT WITH ANESTHESIA;  Surgeon: Radiologist, Medication, MD;  Location: [facility] OR;  Service: Radiology;  Laterality: N/A;   RADIOLOGY WITH ANESTHESIA N/A 08/07/2021   Procedure: MRI WITH LUMBER WITH AND WITHOUT CONTRAST,THORACIC WITH AND WITHOUT CONTRAST;  Surgeon: Radiologist, Medication, MD;  Location: [facility] OR;  Service: Radiology;  Laterality: N/A;   RADIOLOGY WITH ANESTHESIA N/A 09/13/2021   Procedure: MRI WITH ANESTHESIA;  Surgeon: Dolphus Carrion, MD;  Location: [facility] OR;  Service: Radiology;  Laterality: N/A;   TEE WITHOUT CARDIOVERSION N/A 07/29/2021   Procedure: TRANSESOPHAGEAL ECHOCARDIOGRAM (TEE);  Surgeon: Shlomo Wilbert SAUNDERS, MD;  Location: [facility] ENDOSCOPY;  Service: Cardiovascular;  Laterality: N/A;   Social History   Occupational History   Not on file  Tobacco Use   Smoking status: Never   Smokeless tobacco: Current    Types: Chew  Vaping Use   Vaping status: Never Used  Substance and Sexual Activity   Alcohol use: Not Currently   Drug use: Never   Sexual activity: Not Currently

## 2023-09-22 ENCOUNTER — Other Ambulatory Visit: Payer: Self-pay | Admitting: Urology

## 2023-09-22 DIAGNOSIS — N2889 Other specified disorders of kidney and ureter: Secondary | ICD-10-CM

## 2023-09-23 DIAGNOSIS — M5451 Vertebrogenic low back pain: Secondary | ICD-10-CM | POA: Diagnosis not present

## 2023-09-28 ENCOUNTER — Other Ambulatory Visit: Payer: Self-pay | Admitting: Physician Assistant

## 2023-09-28 DIAGNOSIS — T8789 Other complications of amputation stump: Secondary | ICD-10-CM

## 2023-09-29 DIAGNOSIS — M51369 Other intervertebral disc degeneration, lumbar region without mention of lumbar back pain or lower extremity pain: Secondary | ICD-10-CM | POA: Diagnosis not present

## 2023-09-29 DIAGNOSIS — C642 Malignant neoplasm of left kidney, except renal pelvis: Secondary | ICD-10-CM | POA: Diagnosis not present

## 2023-10-05 ENCOUNTER — Ambulatory Visit (HOSPITAL_COMMUNITY): Payer: Medicare HMO

## 2023-10-08 ENCOUNTER — Telehealth: Payer: Self-pay

## 2023-10-08 NOTE — Telephone Encounter (Signed)
Received VM from [facility] representative, asking for call back related to pt ID # [id number]. Called back and spoke to [facility]. This RN was informed that this ID number is associated w/ [name] and a denied claim for 07/27/23 visit w/ Dr. Bartholome Bill stated that [employer] needs 07/27/23 OV notes faxed to the attention of Kenyon Ana at [phone]. Faxed these and received a confirmation of receipt.

## 2023-10-09 ENCOUNTER — Other Ambulatory Visit: Payer: Self-pay | Admitting: Internal Medicine

## 2023-10-09 DIAGNOSIS — E7800 Pure hypercholesterolemia, unspecified: Secondary | ICD-10-CM

## 2023-10-18 ENCOUNTER — Ambulatory Visit
Admission: RE | Admit: 2023-10-18 | Discharge: 2023-10-18 | Disposition: A | Discharge status: Home / Self Care | Payer: Medicare HMO | Source: Ambulatory Visit | Attending: Urology

## 2023-10-18 DIAGNOSIS — N2889 Other specified disorders of kidney and ureter: Secondary | ICD-10-CM | POA: Diagnosis not present

## 2023-10-18 HISTORY — PX: IR RADIOLOGIST EVAL & MGMT: IMG5224

## 2023-10-18 NOTE — Consult Note (Signed)
 Chief Complaint: Patient was seen in consultation today for left renal mass at the request of Herrick,Benjamin W  Referring Physician(s): Herrick,Benjamin W  History of Present Illness: [name] is a 58 y.o. male with a known cystic left renal mass since 2020. This was initially detected by imaging in [place]  state and has been followed by Dr. Dulcy Gibney since 2021 after the patient moved to [facility] to be closer to his son. The lesion has been fairly stable in size at around 3.5 cm, but was recently upgraded from Bosniak III to IV by CT in December. He is referred to discuss possible percutaneous ablation. He is asymptomatic with regard to the renal mass. He denies any left flank pain or hematuria. He has had prior bilateral below knee amputations.  Past Medical History:  Diagnosis Date   Acquired complex renal cyst 09/25/2019   Acute respiratory failure with hypoxia (HCC) secondary to suspected aspiration pneumonia 12/15/2021   AKI (acute kidney injury) (HCC) 07/22/2021   Cancer (HCC)    renal   Chest pain 07/22/2021   Complication of anesthesia    Decubitus ulcer of sacral area 12/15/2021   Depression    Diabetes mellitus without complication (HCC)    Diabetic infection of left foot (HCC) 04/17/2021   Episode of unresponsiveness 12/15/2021   Gas gangrene of foot (HCC) 07/22/2021   Healthcare maintenance 09/25/2019   Hematuria 09/25/2019   History of kidney stones    Lactic acidosis 12/15/2021   Leukocytosis 09/13/2021   Osteomyelitis (HCC)    Sacral pressure injury of skin 07/27/2021   Septic shock (HCC) 07/22/2021   Severe sepsis with acute organ dysfunction (HCC) 07/22/2021   Shortness of breath 05/16/2021   Stage 3a chronic kidney disease (HCC) 09/25/2019   Systolic dysfunction    Type 2 diabetes mellitus 10/12/2019    Past Surgical History:  Procedure Laterality Date   AMPUTATION Left 07/23/2021   Procedure: LEFT BELOW KNEE AMPUTATION;  Surgeon: Timothy Ford, MD;  Location: [facility] OR;  Service: Orthopedics;  Laterality: Left;   BELOW KNEE LEG AMPUTATION Right    BUBBLE STUDY  07/29/2021   Procedure: BUBBLE STUDY;  Surgeon: Jacqueline Matsu, MD;  Location: [facility] ENDOSCOPY;  Service: Cardiovascular;;   CARDIOVERSION N/A 07/29/2021   Procedure: CARDIOVERSION;  Surgeon: Jacqueline Matsu, MD;  Location: [facility] ENDOSCOPY;  Service: Cardiovascular;  Laterality: N/A;   RADIOLOGY WITH ANESTHESIA N/A 08/05/2021   Procedure: MRI LUMBAR WITH AND WITHOUT; THORASIC SPINE WITH AND WITHOUT WITH ANESTHESIA;  Surgeon: Radiologist, Medication, MD;  Location: [facility] OR;  Service: Radiology;  Laterality: N/A;   RADIOLOGY WITH ANESTHESIA N/A 08/07/2021   Procedure: MRI WITH LUMBER WITH AND WITHOUT CONTRAST,THORACIC WITH AND WITHOUT CONTRAST;  Surgeon: Radiologist, Medication, MD;  Location: [facility] OR;  Service: Radiology;  Laterality: N/A;   RADIOLOGY WITH ANESTHESIA N/A 09/13/2021   Procedure: MRI WITH ANESTHESIA;  Surgeon: Luellen Sages, MD;  Location: [facility] OR;  Service: Radiology;  Laterality: N/A;   TEE WITHOUT CARDIOVERSION N/A 07/29/2021   Procedure: TRANSESOPHAGEAL ECHOCARDIOGRAM (TEE);  Surgeon: Jacqueline Matsu, MD;  Location: [facility] ENDOSCOPY;  Service: Cardiovascular;  Laterality: N/A;    Allergies: Bactrim [sulfamethoxazole-trimethoprim], Ciprofloxacin , and Levaquin [levofloxacin]  Medications: Prior to Admission medications   Medication Sig Start Date End Date Taking? Authorizing Provider  ACCU-CHEK GUIDE test strip USE UP TO SIX TIMES DAILY TO CHECK BLOOD SUGARS 04/26/23   Anthon Kins, MD  aspirin  EC 325 MG tablet Take 1 tablet (325 mg  total) by mouth daily. 07/28/23   Anthon Kins, MD  baclofen  (LIORESAL ) 10 MG tablet TAKE 1 TABLET BY MOUTH THREE TIMES DAILY 03/22/23   Anthon Kins, MD  blood glucose meter kit and supplies KIT Dispense based on patient and insurance preference. Use up to four times daily as directed. 12/05/21   Filbert Huff I, NP   celecoxib  (CELEBREX ) 100 MG capsule Take 1 capsule by mouth twice daily 08/21/23   Anthon Kins, MD  furosemide  (LASIX ) 20 MG tablet TAKE 1 TABLET BY MOUTH ONCE DAILY . APPOINTMENT REQUIRED FOR FUTURE REFILLS 09/28/23   Croitoru, Karyl Paget, MD  insulin  NPH-regular Human (NOVOLIN  70/30 RELION) (70-30) 100 UNIT/ML injection Inject 15 Units into the skin 2 (two) times daily with a meal. Patient taking differently: Inject 3-12 Units into the skin 2 (two) times daily with a meal. Sliding scale 12/18/21   Vada Garibaldi, MD  lisinopril  (ZESTRIL ) 10 MG tablet Take 1 tablet (10 mg total) by mouth daily. KEEP JAN. OV. 07/02/23   Von Grumbling, PA-C  Oxycodone  HCl 10 MG TABS Take 10 mg by mouth 3 (three) times daily as needed (Pain). 04/03/22   [provider]  pregabalin (LYRICA) 75 MG capsule Take 75 mg by mouth 2 (two) times daily.    [provider]  rosuvastatin  (CRESTOR ) 20 MG tablet Take 1 tablet by mouth once daily 10/10/23   Anthon Kins, MD  sildenafil  (VIAGRA ) 100 MG tablet Take 100 mg by mouth daily as needed. 12/15/22   [provider]  tamsulosin  (FLOMAX ) 0.4 MG CAPS capsule Take 0.4 mg by mouth daily. 04/21/22   [provider]  testosterone  cypionate (DEPO-TESTOSTERONE ) 200 MG/ML injection Inject 1 mL (200 mg total) into the muscle every 14 (fourteen) days. Significant Risks of heart attack, stroke with continued use. Reducing dose as tolerated highly recommended 06/24/23   Anthon Kins, MD     Family History  Problem Relation Age of Onset   Anxiety disorder Mother    Cancer Father    Coronary artery disease Father    Sleep apnea Neg Hx     Social History   Socioeconomic History   Marital status: Divorced    Spouse name: Not on file   Number of children: Not on file   Years of education: Not on file   Highest education level: Bachelor's degree (e.g., BA, AB, BS)  Occupational History   Not on file  Tobacco Use   Smoking status: Never    Smokeless tobacco: Current    Types: Chew  Vaping Use   Vaping status: Never Used  Substance and Sexual Activity   Alcohol use: Not Currently   Drug use: Never   Sexual activity: Not Currently  Other Topics Concern   Not on file  Social History Narrative   Pt lives alone   Pt not working    Social Drivers of Corporate investment banker Strain: Low Risk  (06/20/2023)   Overall Financial Resource Strain (CARDIA)    Difficulty of Paying Living Expenses: Not hard at all  Food Insecurity: No Food Insecurity (07/27/2023)   Hunger Vital Sign    Worried About Running Out of Food in the Last Year: Never true    Ran Out of Food in the Last Year: Never true  Transportation Needs: No Transportation Needs (07/27/2023)   PRAPARE - Administrator, Civil Service (Medical): No    Lack of Transportation (Non-Medical): No  Physical Activity:  Inactive (06/20/2023)   Exercise Vital Sign    Days of Exercise per Week: 0 days    Minutes of Exercise per Session: 0 min  Stress: Stress Concern Present (06/20/2023)   [employer] of Occupational Health - Occupational Stress Questionnaire    Feeling of Stress : Very much  Social Connections: Unknown (06/20/2023)   Social Connection and Isolation Panel [NHANES]    Frequency of Communication with Friends and Family: Once a week    Frequency of Social Gatherings with Friends and Family: Patient declined    Attends Religious Services: Never    Database administrator or Organizations: No    Attends Engineer, structural: Never    Marital Status: Divorced    ECOG Status: 0 - Asymptomatic  Review of Systems: A 12 point ROS discussed and pertinent positives are indicated in the HPI above.  All other systems are negative.  Review of Systems  Constitutional: Negative.   Respiratory: Negative.    Cardiovascular: Negative.   Gastrointestinal: Negative.   Genitourinary: Negative.   Musculoskeletal:  Positive for back pain.   Neurological: Negative.     Vital Signs: BP (!) 146/92   Pulse 97   Temp 97.6 F (36.4 C) (Oral)   Resp 19   SpO2 95%     Physical Exam Vitals reviewed.  Cardiovascular:     Rate and Rhythm: Normal rate and regular rhythm.     Heart sounds: Normal heart sounds. No murmur heard.    No gallop.  Pulmonary:     Effort: Pulmonary effort is normal. No respiratory distress.     Breath sounds: Normal breath sounds. No stridor. No wheezing, rhonchi or rales.  Abdominal:     General: Bowel sounds are normal. There is no distension.     Palpations: Abdomen is soft.     Tenderness: There is no abdominal tenderness. There is no right CVA tenderness, left CVA tenderness, guarding or rebound.  Musculoskeletal:        General: No swelling.  Skin:    General: Skin is warm and dry.  Neurological:     General: No focal deficit present.     Mental Status: He is alert and oriented to person, place, and time.      Imaging: No results found.  Labs:  CBC: Recent Labs    12/23/22 1423 06/24/23 1411 07/27/23 1412  WBC 6.2 6.6 7.4  HGB 19.4 Repeated and verified X2.* 18.8 Repeated and verified X2.* 18.5*  HCT 56.6 Repeated and verified X2.* 56.9* 55.0*  PLT 193.0 210.0 201    COAGS: No results for input(s): "INR", "APTT" in the last 8760 hours.  BMP: Recent Labs    12/23/22 1423 06/24/23 1411 07/27/23 1412  NA 139 136 139  K 4.2 4.5 4.2  CL 103 103 104  CO2 24 23 30   GLUCOSE 228* 235* 100*  BUN 25* 18 18  CALCIUM  9.6 9.4 9.4  CREATININE 1.19 1.07 1.19  GFRNONAA  --   --  >60    LIVER FUNCTION TESTS: Recent Labs    12/23/22 1423 06/24/23 1411 07/27/23 1412  BILITOT 0.9 0.5 0.6  AST 30 33 32  ALT 55* 39 45*  ALKPHOS 100 95 85  PROT 6.8 6.9 6.7  ALBUMIN 4.2 4.0 4.0     Assessment and Plan:  I reviewed imaging with Mr. Tyburski which consists of annual CT imaging dating back to 2021 and an outside MRI from 2020. Based on the most recent  CT on 08/23/23, I  measured the partially exophytic and partially endophytic left posteromedial interpolar lesion at 3.4 x 2.7 x 3.0 cm. Despite not changing much in size since 2020, the cystic lesion does demonstrate some increased irregular enhancement internally in it's superior aspect not necessarily associated with septations and the lesion does trend towards a Bosniak IV lesion now compared to a III previously. This does significantly increase risk of malignancy.  Given appearance, I discussed treatment options with Mr. Mantey including partial nephrectomy and percutaneous ablation. The lesion is in a location and of size amenable to percutaneous cryoablation. I don't think biopsy is necessary prior to ablation given potential for false negative sampling of a more cystic area, and biopsy could be done at the time of ablation. Dr. Dulcy Gibney has told Mr. Byun that he is not a good candidate for partial nephrectomy given his prior bilateral BKA, lack of right prosthesis and current dependence on a wheelchair full time for ambulation.  We discussed technical details, risks, need for general anesthesia for the procedure, outcomes, follow up and recurrence rates after ablation. After discussion, Mr. Snawder would like to proceed with scheduling cryoablation of the left renal mass. We will begin the scheduling process at [facility].  Thank you for this interesting consult.  I greatly enjoyed meeting Mcclain Ottinger and look forward to participating in their care.  A copy of this report was sent to the requesting provider on this date.  Electronically Signed: Aileen Alexanders 10/18/2023, 3:01 PM    I spent a total of 40 Minutes in face to face in clinical consultation, greater than 50% of which was counseling/coordinating care for a left renal mass.

## 2023-10-19 ENCOUNTER — Other Ambulatory Visit: Payer: Self-pay | Admitting: Oncology

## 2023-10-19 ENCOUNTER — Ambulatory Visit (INDEPENDENT_AMBULATORY_CARE_PROVIDER_SITE_OTHER): Payer: Medicare HMO | Admitting: Orthopedic Surgery

## 2023-10-19 DIAGNOSIS — D751 Secondary polycythemia: Secondary | ICD-10-CM

## 2023-10-19 DIAGNOSIS — Z89512 Acquired absence of left leg below knee: Secondary | ICD-10-CM

## 2023-10-19 DIAGNOSIS — M79661 Pain in right lower leg: Secondary | ICD-10-CM

## 2023-10-19 DIAGNOSIS — Z89511 Acquired absence of right leg below knee: Secondary | ICD-10-CM

## 2023-10-19 MED ORDER — PENTOXIFYLLINE ER 400 MG PO TBCR: 400.0000 mg | EXTENDED_RELEASE_TABLET | Freq: Three times a day (TID) | ORAL | 3 refills | Status: DC

## 2023-10-20 ENCOUNTER — Encounter: Payer: Self-pay | Admitting: Internal Medicine

## 2023-10-22 ENCOUNTER — Encounter: Payer: Self-pay | Admitting: Orthopedic Surgery

## 2023-10-22 NOTE — Progress Notes (Signed)
Office Visit Note   Patient: [name]           Date of Birth: Dec 09, 1965           MRN: [id number] Visit Date: 10/19/2023              Requested by: Lula Olszewski, MD [address] [place],  [place] [address] PCP: Lula Olszewski, MD  Chief Complaint  Patient presents with   Left Leg - Follow-up   Right Leg - Follow-up      HPI: Patient is seen in follow-up for the right transtibial amputation.  Patient presents with persistent brawny skin color changes and difficulty wearing his prosthesis.  Patient states he has a lesion on his kidney and is going to undergo surgery at [facility].  Patient is on Lyrica.  Assessment & Plan: Visit Diagnoses: No diagnosis found.  Plan: Will place an order for Trental to see if this helps improve his vascular status.  Follow-Up Instructions: Return in about 4 weeks (around 11/16/2023).   Ortho Exam  Patient is alert, oriented, no adenopathy, well-dressed, normal affect, normal respiratory effort.  Regular shrinker patient underwent evaluation of the peroneal nerve but no relief with peroneal nerve injection.  He has a strong popliteal pulse today by Doppler today. Examination there is brawny skin color changes with massage the redness resolves.  His leg is cold to the touch.  Imaging: No results found. No images are attached to the encounter.  Labs: Lab Results  Component Value Date   HGBA1C 8.1 (H) 06/24/2023   HGBA1C 7.1 (H) 12/23/2022   HGBA1C 11.9 (H) 06/15/2022   ESRSEDRATE 25 (H) 07/03/2022   ESRSEDRATE 11 12/30/2021   ESRSEDRATE 12 12/29/2021   CRP <1.0 07/03/2022   CRP 0.7 12/30/2021   CRP 1.1 (H) 12/29/2021   REPTSTATUS 04/11/2022 FINAL 04/09/2022   CULT >=100,000 COLONIES/mL PSEUDOMONAS AERUGINOSA (A) 04/09/2022   LABORGA PSEUDOMONAS AERUGINOSA (A) 04/09/2022     Lab Results  Component Value Date   ALBUMIN 4.0 07/27/2023   ALBUMIN 4.0 06/24/2023   ALBUMIN 4.2 12/23/2022    Lab Results  Component  Value Date   MG 1.7 12/18/2021   MG 1.4 (L) 12/16/2021   MG 1.5 (L) 08/15/2021   No results found for: "VD25OH"  No results found for: "PREALBUMIN"    Latest Ref Rng & Units 07/27/2023    2:12 PM 06/24/2023    2:11 PM 12/23/2022    2:23 PM  CBC EXTENDED  WBC 4.0 - 10.5 K/uL 7.4  6.6  6.2   RBC 4.22 - 5.81 MIL/uL 6.20  6.14  6.27   Hemoglobin 13.0 - 17.0 g/dL 19.1  47.8 Repeated and verified X2.  19.4 Repeated and verified X2.   HCT 39.0 - 52.0 % 55.0  56.9  56.6 Repeated and verified X2.   Platelets 150 - 400 K/uL 201  210.0  193.0   NEUT# 1.7 - 7.7 K/uL 5.7  5.4  4.7   Lymph# 0.7 - 4.0 K/uL 0.9  0.6  0.7      There is no height or weight on file to calculate BMI.  Orders:  No orders of the defined types were placed in this encounter.  Meds ordered this encounter  Medications   pentoxifylline (TRENTAL) 400 MG CR tablet    Sig: Take 1 tablet (400 mg total) by mouth 3 (three) times daily with meals.    Dispense:  90 tablet    Refill:  3     Procedures: No procedures performed  Clinical Data: No additional findings.  ROS:  All other systems negative, except as noted in the HPI. Review of Systems  Objective: Vital Signs: There were no vitals taken for this visit.  Specialty Comments:  No specialty comments available.  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   Malaise 06/22/2022   Erythrocytosis 06/22/2022   Amputation below knee (HCC) 04/23/2022   PTSD (post-traumatic stress disorder) 04/23/2022   Generalized anxiety disorder 01/21/2022   Stage 4 skin ulcer of sacral region (HCC) 12/29/2021   Episode of unresponsiveness 12/15/2021   Renal mass 12/15/2021   Stage IV pressure  ulcer of sacral region (HCC) 09/15/2021   Insomnia 09/14/2021   Chronic systolic CHF (congestive heart failure) (HCC) 09/14/2021   Hypogonadism in male 09/14/2021   Chronic pain 09/14/2021   Pseudohyponatremia 09/13/2021   Normocytic anemia 09/13/2021   Discitis of thoracic region    Paroxysmal atrial flutter (HCC) 07/22/2021   Chronic diastolic (congestive) heart failure (HCC) 05/16/2021   Lumbar spondylosis 03/25/2021   H/O amputation of leg through tibia and fibula (HCC) 11/10/2019   Degeneration of lumbar intervertebral disc 11/10/2019   Hypercholesterolemia 11/10/2019   S/P bilateral below knee amputation (HCC) 10/30/2019   Dyslipidemia 10/30/2019   Type 1 diabetes mellitus with diabetic polyneuropathy, with long-term current use of insulin (HCC) 10/12/2019   Hypertensive disorder 09/25/2019   Urinary hesitancy 09/25/2019   Prosthesis adjustments 09/25/2019   Erectile dysfunction 09/25/2019   Palliative care status 09/25/2019   Pulmonary nodules 09/25/2019   Depressive disorder 09/25/2019   Past Medical History:  Diagnosis Date   Acquired complex renal cyst 09/25/2019   Acute respiratory failure with hypoxia (HCC) secondary to suspected aspiration pneumonia 12/15/2021   AKI (acute kidney injury) (HCC) 07/22/2021   Cancer (HCC)    renal   Chest pain 07/22/2021   Complication of anesthesia    Decubitus ulcer of sacral area 12/15/2021   Depression    Diabetes mellitus without complication (HCC)    Diabetic infection of left foot (HCC) 04/17/2021   Episode of unresponsiveness 12/15/2021   Gas gangrene of foot (HCC) 07/22/2021   Healthcare maintenance 09/25/2019   Hematuria 09/25/2019   History of kidney stones    Lactic acidosis 12/15/2021   Leukocytosis 09/13/2021   Osteomyelitis (HCC)    Sacral pressure injury of skin 07/27/2021   Septic shock (HCC) 07/22/2021   Severe sepsis with acute organ dysfunction (HCC) 07/22/2021   Shortness of breath 05/16/2021   Stage 3a  chronic kidney disease (HCC) 09/25/2019   Systolic dysfunction    Type 2 diabetes mellitus 10/12/2019    Family History  Problem Relation Age of Onset   Anxiety disorder Mother    Cancer Father    Coronary artery disease Father    Sleep apnea Neg Hx     Past Surgical History:  Procedure Laterality Date   AMPUTATION Left 07/23/2021   Procedure: LEFT BELOW KNEE AMPUTATION;  Surgeon: Nadara Mustard, MD;  Location: [facility] OR;  Service: Orthopedics;  Laterality: Left;   BELOW KNEE LEG AMPUTATION Right    BUBBLE STUDY  07/29/2021   Procedure: BUBBLE STUDY;  Surgeon: Quintella Reichert, MD;  Location: [facility]  ENDOSCOPY;  Service: Cardiovascular;;   CARDIOVERSION N/A 07/29/2021   Procedure: CARDIOVERSION;  Surgeon: Quintella Reichert, MD;  Location: [facility] ENDOSCOPY;  Service: Cardiovascular;  Laterality: N/A;   IR RADIOLOGIST EVAL & MGMT  10/18/2023   RADIOLOGY WITH ANESTHESIA N/A 08/05/2021   Procedure: MRI LUMBAR WITH AND WITHOUT; THORASIC SPINE WITH AND WITHOUT WITH ANESTHESIA;  Surgeon: Radiologist, Medication, MD;  Location: [facility] OR;  Service: Radiology;  Laterality: N/A;   RADIOLOGY WITH ANESTHESIA N/A 08/07/2021   Procedure: MRI WITH LUMBER WITH AND WITHOUT CONTRAST,THORACIC WITH AND WITHOUT CONTRAST;  Surgeon: Radiologist, Medication, MD;  Location: [facility] OR;  Service: Radiology;  Laterality: N/A;   RADIOLOGY WITH ANESTHESIA N/A 09/13/2021   Procedure: MRI WITH ANESTHESIA;  Surgeon: Julieanne Cotton, MD;  Location: [facility] OR;  Service: Radiology;  Laterality: N/A;   TEE WITHOUT CARDIOVERSION N/A 07/29/2021   Procedure: TRANSESOPHAGEAL ECHOCARDIOGRAM (TEE);  Surgeon: Quintella Reichert, MD;  Location[facility] ENDOSCOPY;  Service: Cardiovascular;  Laterality: N/A;   Social History   Occupational History   Not on file  Tobacco Use   Smoking status: Never   Smokeless tobacco: Current    Types: Chew  Vaping Use   Vaping status: Never Used  Substance and Sexual Activity   Alcohol use: Not Currently   Drug use: Never    Sexual activity: Not Currently

## 2023-10-25 ENCOUNTER — Telehealth (HOSPITAL_COMMUNITY): Payer: Self-pay | Admitting: Physician Assistant

## 2023-10-25 NOTE — Telephone Encounter (Signed)
Patient cancelled echocardiogram or reason below:  10/25/23 09/29/2023 9:30 AM ZO:[id number], DONNA S  Cancel Rsn: Patient (does not want to pay 325)   Order will be removed from the [facility] wq.

## 2023-10-26 ENCOUNTER — Inpatient Hospital Stay: Payer: Medicare HMO

## 2023-10-26 ENCOUNTER — Encounter: Payer: Self-pay | Admitting: Oncology

## 2023-10-26 ENCOUNTER — Other Ambulatory Visit: Payer: Medicare HMO

## 2023-10-26 ENCOUNTER — Inpatient Hospital Stay: Payer: Medicare HMO | Attending: Oncology | Admitting: Oncology

## 2023-10-26 ENCOUNTER — Other Ambulatory Visit (HOSPITAL_COMMUNITY): Payer: Self-pay | Admitting: Interventional Radiology

## 2023-10-26 VITALS — BP 110/88 | HR 98 | Temp 99.5°F | Resp 20

## 2023-10-26 DIAGNOSIS — N2889 Other specified disorders of kidney and ureter: Secondary | ICD-10-CM | POA: Insufficient documentation

## 2023-10-26 DIAGNOSIS — D751 Secondary polycythemia: Secondary | ICD-10-CM

## 2023-10-26 DIAGNOSIS — Z89511 Acquired absence of right leg below knee: Secondary | ICD-10-CM | POA: Insufficient documentation

## 2023-10-26 DIAGNOSIS — Z89512 Acquired absence of left leg below knee: Secondary | ICD-10-CM | POA: Diagnosis not present

## 2023-10-26 DIAGNOSIS — E291 Testicular hypofunction: Secondary | ICD-10-CM | POA: Diagnosis not present

## 2023-10-26 LAB — CBC WITH DIFFERENTIAL (CANCER CENTER ONLY)
Abs Immature Granulocytes: 0.04 10*3/uL (ref 0.00–0.07)
Basophils Absolute: 0 10*3/uL (ref 0.0–0.1)
Basophils Relative: 0 %
Eosinophils Absolute: 0.1 10*3/uL (ref 0.0–0.5)
Eosinophils Relative: 1 %
HCT: 51.5 % (ref 39.0–52.0)
Hemoglobin: 17.9 g/dL — ABNORMAL HIGH (ref 13.0–17.0)
Immature Granulocytes: 1 %
Lymphocytes Relative: 8 %
Lymphs Abs: 0.5 10*3/uL — ABNORMAL LOW (ref 0.7–4.0)
MCH: 31.1 pg (ref 26.0–34.0)
MCHC: 34.8 g/dL (ref 30.0–36.0)
MCV: 89.4 fL (ref 80.0–100.0)
Monocytes Absolute: 0.6 10*3/uL (ref 0.1–1.0)
Monocytes Relative: 9 %
Neutro Abs: 5.2 10*3/uL (ref 1.7–7.7)
Neutrophils Relative %: 81 %
Platelet Count: 191 10*3/uL (ref 150–400)
RBC: 5.76 MIL/uL (ref 4.22–5.81)
RDW: 15.4 % (ref 11.5–15.5)
WBC Count: 6.4 10*3/uL (ref 4.0–10.5)
nRBC: 0 % (ref 0.0–0.2)

## 2023-10-26 LAB — CMP (CANCER CENTER ONLY)
ALT: 33 U/L (ref 0–44)
AST: 24 U/L (ref 15–41)
Albumin: 3.7 g/dL (ref 3.5–5.0)
Alkaline Phosphatase: 92 U/L (ref 38–126)
Anion gap: 5 (ref 5–15)
BUN: 18 mg/dL (ref 6–20)
CO2: 29 mmol/L (ref 22–32)
Calcium: 9 mg/dL (ref 8.9–10.3)
Chloride: 106 mmol/L (ref 98–111)
Creatinine: 1.3 mg/dL — ABNORMAL HIGH (ref 0.61–1.24)
GFR, Estimated: >60 mL/min (ref >60)
Glucose, Bld: 94 mg/dL (ref 70–99)
Potassium: 3.8 mmol/L (ref 3.5–5.1)
Sodium: 140 mmol/L (ref 135–145)
Total Bilirubin: 0.5 mg/dL (ref 0.0–1.2)
Total Protein: 6.6 g/dL (ref 6.5–8.1)

## 2023-10-26 NOTE — Assessment & Plan Note (Signed)
 Long-term use of testosterone injections, currently 1cc every two weeks. Previously was on a higher dose of 3cc per week. -Patient continues testosterone cypionate despite risks, as discussed by his PCP.  Dr. Jon Billings is planning on cutting down on testosterone dose. -PSA monitoring as per urology.

## 2023-10-26 NOTE — Assessment & Plan Note (Addendum)
-  Elevated hemoglobin and hematocrit noted on multiple occasions since 2023 at least. Discussed potential causes including primary polycythemia/MPN, secondary causes including stressors such as smoking, sleep apnea, certain medications, and low oxygen environments. Patient has a history of long-term testosterone therapy which could contribute. Also noted a tumor on the left kidney which could potentially produce erythropoietin, stimulating red cell production.   On his initial consultation with [place] on 07/27/2023, labs showed hematocrit of 55, hemoglobin 18.5.  White count normal with normal differential.  Platelet count normal.  CMP grossly unremarkable.  LDH within normal limits.  Erythropoietin level was increased at 40.7 mIU/mL.  JAK2 testing, CALR, MPL mutations, extended panel was negative.   He has history of tumor on left kidney.  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  He is scheduled for ablative procedure by IR in March 2025.   Most likely etiology for secondary polycythemia is his testosterone supplementation, although patient does not believe that this could be the case for him since he has been on testosterone injections for more than 10 years.  Polycythemia could also be contributed by kidney tumor as noted by elevated erythropoietin level.   We will consider therapeutic phlebotomy if hematocrit remains above 55, to avoid complications from hyperviscosity including blood clots, cerebrovascular or cardiovascular events.  Patient states that he has pressure ulcer on his sacral area and does not want to come for phlebotomies, unless absolutely necessary.  Most recent hematocrit levels have been below 55, thus avoiding the need for therapeutic phlebotomies.

## 2023-10-26 NOTE — Assessment & Plan Note (Signed)
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 Concern for RCC but patient previously declined surgery. Bosniak 3 previously, currently Bosniak 4. -He recently established with IR and plan is to proceed with ablation of the kidney lesion in March 2025. -Follows with Dr. Marlou Porch -Continue close monitoring with urology.

## 2023-10-26 NOTE — Progress Notes (Signed)
[facility]  HEMATOLOGY CLINIC PROGRESS NOTE  PATIENT NAME: [name]   MR#: [id number] DOB: 10-Jun-1966  Patient Care Team: [name] Olszewski, MD as PCP - General (Internal Medicine) [name] Fair, MD as PCP - Cardiology (Cardiology) [name] Luz, MD as Consulting Physician (Physical Medicine and Rehabilitation) [name] Fat, MD as Attending Physician (Urology) [name] Guess, MD as Consulting Physician (General Surgery) [facility] (Orthopedic Surgery)  Date of visit: 10/26/2023   ASSESSMENT & PLAN:   [name] is a 58 y.o. gentleman with past medical history of insulin-dependent diabetes mellitus, with diabetic neuropathy, congestive heart failure, male hypogonadism (on testosterone supplementation), hypertension, kidney stones, tumor on left kidney being monitored by urologist, history of bilateral BKA, was referred to our clinic in November 2024 for evaluation of polycythemia.  Secondary polycythemia presumed to be related to testosterone supplementation and also a kidney tumor.  JAK2 testing, CALR, MPL mutations negative in November 2024.  Secondary polycythemia -Elevated hemoglobin and hematocrit noted on multiple occasions since 2023 at least. Discussed potential causes including primary polycythemia/MPN, secondary causes including stressors such as smoking, sleep apnea, certain medications, and low oxygen environments. Patient has a history of long-term testosterone therapy which could contribute. Also noted a tumor on the left kidney which could potentially produce erythropoietin, stimulating red cell production.   On his initial consultation with [place] on 07/27/2023, labs showed hematocrit of 55, hemoglobin 18.5.  White count normal with normal differential.  Platelet count normal.  CMP grossly unremarkable.  LDH within normal limits.  Erythropoietin level was increased at 40.7 mIU/mL.  JAK2 testing, CALR, MPL mutations, extended panel was  negative.   He has history of tumor on left kidney.  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  He is scheduled for ablative procedure by IR in March 2025.   Most likely etiology for secondary polycythemia is his testosterone supplementation, although patient does not believe that this could be the case for him since he has been on testosterone injections for more than 10 years.  Polycythemia could also be contributed by kidney tumor as noted by elevated erythropoietin level.   We will consider therapeutic phlebotomy if hematocrit remains above 55, to avoid complications from hyperviscosity including blood clots, cerebrovascular or cardiovascular events.  Patient states that he has pressure ulcer on his sacral area and does not want to come for phlebotomies, unless absolutely necessary.  Most recent hematocrit levels have been below 55, thus avoiding the need for therapeutic phlebotomies.  Renal mass Tumor on left kidney, currently being monitored by urology. Concern for RCC but patient previously declined surgery. Bosniak 3 previously, currently Bosniak 4. -He recently established with IR and plan is to proceed with ablation of the kidney lesion in March 2025. -Follows with Dr. Marlou Porch -Continue close monitoring with urology.  Hypogonadism in male Long-term use of testosterone injections, currently 1cc every two weeks. Previously was on a higher dose of 3cc per week. -Patient continues testosterone cypionate despite risks, as discussed by his PCP.  Dr. Jon Billings is planning on cutting down on testosterone dose. -PSA monitoring as per urology.  I spent a total of 25 minutes during this encounter with the patient including review of chart and various tests results, discussions about plan of care and coordination of care plan.  I reviewed lab results and outside records for this visit and discussed relevant results with the patient. Diagnosis, plan of care and  treatment options were also discussed  in detail with the patient. Opportunity provided to ask questions and answers provided to his apparent satisfaction. Provided instructions to call our clinic with any problems, questions or concerns prior to return visit. I recommended to continue follow-up with PCP and sub-specialists. He verbalized understanding and agreed with the plan. No barriers to learning was detected.  Meryl Crutch, MD  10/26/2023 5:01 PM  [facility] - A DEPT OF [facility] [address] [place] [address] Dept: [phone] Dept Fax: [phone]   CHIEF COMPLAINT/ REASON FOR VISIT:  Follow-up for secondary polycythemia, presumed to be related to testosterone supplementation.  JAK2 testing, CALR, MPL mutations negative in November 2024.  INTERVAL HISTORY:  Discussed the use of AI scribe software for clinical note transcription with the patient, who gave verbal consent to proceed.   Patient returns for follow-up.  The patient's hemoglobin level is slightly high but within a safe range. The patient is also on testosterone supplements, which could be contributing to the high hemoglobin level.  He has chronic back pain which is stable.  The patient has a history of leg amputation due to a misdiagnosis of osteomyelitis and expresses distrust in doctors.  SUMMARY OF HEMATOLOGIC HISTORY:  On 06/24/2023, labs at his PCPs office showed hemoglobin of 18.8, hematocrit of 56.9.  White count and platelet count were within normal limits.  Previously labs in April 2024 showed hemoglobin of 19.4, hematocrit of 56.6.  Given persistent erythrocytosis, referral was sent to [place] for further evaluation.   The patient's hemoglobin has been slightly high since 2023, with a notable increase in 2024. The patient denies any history of blood clots, chest pain, trouble breathing, dizziness, or lightheadedness. The patient has not  been diagnosed with sleep apnea. The patient chews tobacco but does not smoke. The patient also has a history of a stage 4 bed sore, which took 18 months to heal and still causes discomfort. The patient is scheduled for an MRI for a suspected issue with the right leg.  Discussed potential causes including primary polycythemia/MPN, secondary causes including stressors such as smoking, sleep apnea, certain medications, and low oxygen environments. Patient has a history of long-term testosterone therapy which could contribute. Also noted a tumor on the left kidney which could potentially produce erythropoietin, stimulating red cell production.   On his initial consultation with [place] on 07/27/2023, labs showed hematocrit of 55, hemoglobin 18.5.  White count normal with normal differential.  Platelet count normal.  CMP grossly unremarkable.  LDH within normal limits.  Erythropoietin level was increased at 40.7 mIU/mL.  JAK2 testing, CALR, MPL mutations, extended panel was negative.   He has history of tumor on left kidney.  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  He is scheduled for ablative procedure by IR in March 2025.   Most likely etiology for secondary polycythemia is his testosterone supplementation, although patient does not believe that this could be the case for him since he has been on testosterone injections for more than 10 years.  Polycythemia could also be contributed by kidney tumor as noted by elevated erythropoietin level.   We will consider therapeutic phlebotomy if hematocrit remains above 55, to avoid complications from hyperviscosity including blood clots, cerebrovascular or cardiovascular events.  Patient states that he has pressure ulcer on his sacral area and does not want to come for phlebotomies, unless absolutely necessary.  Most recent hematocrit levels have  been below 55, thus avoiding the need for therapeutic phlebotomies.  I have reviewed the  past medical history, past surgical history, social history and family history with the patient and they are unchanged from previous note.  ALLERGIES: He is allergic to bactrim [sulfamethoxazole-trimethoprim], ciprofloxacin, and levaquin [levofloxacin].  MEDICATIONS:  Current Outpatient Medications  Medication Sig Dispense Refill   ACCU-CHEK GUIDE test strip USE UP TO SIX TIMES DAILY TO CHECK BLOOD SUGARS 200 each 0   aspirin EC 325 MG tablet Take 1 tablet (325 mg total) by mouth daily. 90 tablet 3   baclofen (LIORESAL) 10 MG tablet TAKE 1 TABLET BY MOUTH THREE TIMES DAILY 30 tablet 0   blood glucose meter kit and supplies KIT Dispense based on patient and insurance preference. Use up to four times daily as directed. 1 each 0   celecoxib (CELEBREX) 100 MG capsule Take 1 capsule by mouth twice daily 60 capsule 0   furosemide (LASIX) 20 MG tablet TAKE 1 TABLET BY MOUTH ONCE DAILY . APPOINTMENT REQUIRED FOR FUTURE REFILLS 90 tablet 3   insulin NPH-regular Human (NOVOLIN 70/30 RELION) (70-30) 100 UNIT/ML injection Inject 15 Units into the skin 2 (two) times daily with a meal. (Patient taking differently: Inject 3-12 Units into the skin 2 (two) times daily with a meal. Sliding scale) 10 mL 11   lisinopril (ZESTRIL) 10 MG tablet Take 1 tablet (10 mg total) by mouth daily. KEEP JAN. OV. 30 tablet 3   Oxycodone HCl 10 MG TABS Take 10 mg by mouth 3 (three) times daily as needed (Pain).     pentoxifylline (TRENTAL) 400 MG CR tablet Take 1 tablet (400 mg total) by mouth 3 (three) times daily with meals. 90 tablet 3   pregabalin (LYRICA) 75 MG capsule Take 75 mg by mouth 2 (two) times daily.     rosuvastatin (CRESTOR) 20 MG tablet Take 1 tablet by mouth once daily 90 tablet 3   sildenafil (VIAGRA) 100 MG tablet Take 100 mg by mouth daily as needed.     tamsulosin (FLOMAX) 0.4 MG CAPS capsule Take 0.4 mg by mouth daily.     testosterone cypionate (DEPO-TESTOSTERONE) 200 MG/ML injection Inject 1 mL (200 mg  total) into the muscle every 14 (fourteen) days. Significant Risks of heart attack, stroke with continued use. Reducing dose as tolerated highly recommended 10 mL 0   No current facility-administered medications for this visit.     REVIEW OF SYSTEMS:    ROS  All other pertinent systems were reviewed with the patient and are negative.  PHYSICAL EXAMINATION:    Onc Performance Status - 10/26/23 1515       ECOG Perf Status   ECOG Perf Status Capable of only limited selfcare, confined to bed or chair more than 50% of waking hours      KPS SCALE   KPS % SCORE Disabled, requires special care and assistance             Vitals:   10/26/23 1509  BP: 110/88  Pulse: 98  Resp: 20  Temp: 99.5 F (37.5 C)  SpO2: 98%   There were no vitals filed for this visit.  Physical Exam Constitutional:      General: He is not in acute distress.    Comments: Presented to clinic in a wheelchair  HENT:     Head: Normocephalic and atraumatic.  Eyes:     General: No scleral icterus.    Conjunctiva/sclera: Conjunctivae normal.  Cardiovascular:  Rate and Rhythm: Normal rate and regular rhythm.  Pulmonary:     Effort: Pulmonary effort is normal.  Musculoskeletal:     Comments: Status post bilateral BKA  Neurological:     Mental Status: He is alert and oriented to person, place, and time.     LABORATORY DATA:   I have reviewed the data as listed.  Results for orders placed or performed in visit on 10/26/23  CMP (Cancer Center only)  Result Value Ref Range   Sodium 140 135 - 145 mmol/L   Potassium 3.8 3.5 - 5.1 mmol/L   Chloride 106 98 - 111 mmol/L   CO2 29 22 - 32 mmol/L   Glucose, Bld 94 70 - 99 mg/dL   BUN 18 6 - 20 mg/dL   Creatinine 1.61 (H) 0.96 - 1.24 mg/dL   Calcium 9.0 8.9 - 04.5 mg/dL   Total Protein 6.6 6.5 - 8.1 g/dL   Albumin 3.7 3.5 - 5.0 g/dL   AST 24 15 - 41 U/L   ALT 33 0 - 44 U/L   Alkaline Phosphatase 92 38 - 126 U/L   Total Bilirubin 0.5 0.0 - 1.2  mg/dL   GFR, Estimated >40 >98 mL/min   Anion gap 5 5 - 15  CBC with Differential (Cancer Center Only)  Result Value Ref Range   WBC Count 6.4 4.0 - 10.5 K/uL   RBC 5.76 4.22 - 5.81 MIL/uL   Hemoglobin 17.9 (H) 13.0 - 17.0 g/dL   HCT 11.9 14.7 - 82.9 %   MCV 89.4 80.0 - 100.0 fL   MCH 31.1 26.0 - 34.0 pg   MCHC 34.8 30.0 - 36.0 g/dL   RDW 56.2 13.0 - 86.5 %   Platelet Count 191 150 - 400 K/uL   nRBC 0.0 0.0 - 0.2 %   Neutrophils Relative % 81 %   Neutro Abs 5.2 1.7 - 7.7 K/uL   Lymphocytes Relative 8 %   Lymphs Abs 0.5 (L) 0.7 - 4.0 K/uL   Monocytes Relative 9 %   Monocytes Absolute 0.6 0.1 - 1.0 K/uL   Eosinophils Relative 1 %   Eosinophils Absolute 0.1 0.0 - 0.5 K/uL   Basophils Relative 0 %   Basophils Absolute 0.0 0.0 - 0.1 K/uL   Immature Granulocytes 1 %   Abs Immature Granulocytes 0.04 0.00 - 0.07 K/uL    RADIOGRAPHIC STUDIES:  No recent pertinent imaging studies available to review.  No orders of the defined types were placed in this encounter.    Future Appointments  Date Time Provider Department Center  11/16/2023  1:45 PM Nadara Mustard, MD [facility] None  12/01/2023  8:30 AM [facility] 1 [facility] [place]  12/27/2023  1:20 PM [name] Olszewski, MD [facility] PEC  03/27/2024  3:00 PM [facility] ANNUAL WELLNESS VISIT 2 [facility] PEC  04/25/2024  2:45 PM [facility] PHLEBOTOMIST [facility] None  04/25/2024  3:00 PM Devaney Segers, Archie Patten, MD [facility] None     This document was completed utilizing speech recognition software. Grammatical errors, random word insertions, pronoun errors, and incomplete sentences are an occasional consequence of this system due to software limitations, ambient noise, and hardware issues. Any formal questions or concerns about the content, text or information contained within the body of this dictation should be directly addressed to the provider for clarification.

## 2023-10-28 ENCOUNTER — Encounter: Payer: Self-pay | Admitting: Oncology

## 2023-10-28 LAB — ERYTHROPOIETIN: Erythropoietin: 23.4 m[IU]/mL — ABNORMAL HIGH (ref 2.6–18.5)

## 2023-11-08 ENCOUNTER — Other Ambulatory Visit: Payer: Self-pay | Admitting: Physician Assistant

## 2023-11-15 ENCOUNTER — Other Ambulatory Visit: Payer: Self-pay

## 2023-11-15 NOTE — Progress Notes (Signed)
 Surgery orders requested with [place] in radiology.

## 2023-11-16 ENCOUNTER — Ambulatory Visit: Payer: Medicare HMO | Admitting: Orthopedic Surgery

## 2023-11-18 NOTE — Patient Instructions (Signed)
 SURGICAL WAITING ROOM VISITATION  Patients having surgery or a procedure may have no more than 2 support people in the waiting area - these visitors may rotate.    Children under the age of 35 must have an adult with them who is not the patient.  Due to an increase in RSV and influenza rates and associated hospitalizations, children ages 81 and under may not visit patients in [facility] hospitals.  Visitors with respiratory illnesses are discouraged from visiting and should remain at home.  If the patient needs to stay at the hospital during part of their recovery, the visitor guidelines for inpatient rooms apply. Pre-op nurse will coordinate an appropriate time for 1 support person to accompany patient in pre-op.  This support person may not rotate.    Please refer to the [facility] website for the visitor guidelines for Inpatients (after your surgery is over and you are in a regular room).    Your procedure is scheduled on: 12/01/23   Report to [facility] Main Entrance    Report to admitting at 6:15 AM   Call this number if you have problems the morning of surgery [phone]   Do not eat food or drink liquids :After Midnight.          If you have questions, please contact your surgeon's office.   FOLLOW BOWEL PREP AND ANY ADDITIONAL PRE OP INSTRUCTIONS YOU RECEIVED FROM YOUR SURGEON'S OFFICE!!!     Oral Hygiene is also important to reduce your risk of infection.                                    Remember - BRUSH YOUR TEETH THE MORNING OF SURGERY WITH YOUR REGULAR TOOTHPASTE  DENTURES WILL BE REMOVED PRIOR TO SURGERY PLEASE DO NOT APPLY "Poly grip" OR ADHESIVES!!!   Stop all vitamins and herbal supplements 7 days before surgery.   Take these medicines the morning of surgery with A SIP OF WATER: Oxycodone, Lyrica, Rosuvastatin, Tamsulosin  How to Manage Your Diabetes Before and After Surgery  Why is it important to control my blood sugar before and after  surgery? Improving blood sugar levels before and after surgery helps healing and can limit problems. A way of improving blood sugar control is eating a healthy diet by:  Eating less sugar and carbohydrates  Increasing activity/exercise  Talking with your doctor about reaching your blood sugar goals High blood sugars (greater than 180 mg/dL) can raise your risk of infections and slow your recovery, so you will need to focus on controlling your diabetes during the weeks before surgery. Make sure that the doctor who takes care of your diabetes knows about your planned surgery including the date and location.  How do I manage my blood sugar before surgery? Check your blood sugar at least 4 times a day, starting 2 days before surgery, to make sure that the level is not too high or low. Check your blood sugar the morning of your surgery when you wake up and every 2 hours until you get to the Short Stay unit. If your blood sugar is less than 70 mg/dL, you will need to treat for low blood sugar: Do not take insulin. Treat a low blood sugar (less than 70 mg/dL) with  cup of clear juice (cranberry or apple), 4 glucose tablets, OR glucose gel. Recheck blood sugar in 15 minutes after treatment (to make sure  it is greater than 70 mg/dL). If your blood sugar is not greater than 70 mg/dL on recheck, call [phone] for further instructions. Report your blood sugar to the short stay nurse when you get to Short Stay.  If you are admitted to the hospital after surgery: Your blood sugar will be checked by the staff and you will probably be given insulin after surgery (instead of oral diabetes medicines) to make sure you have good blood sugar levels. The goal for blood sugar control after surgery is 80-180 mg/dL.   WHAT DO I DO ABOUT MY DIABETES MEDICATION?  Do not take oral diabetes medicines (pills) the morning of surgery.  THE DAY BEFORE SURGERY, take insulin as prescribed.      THE MORNING OF  SURGERY, do not take insulin unless blood sugar is greater than 220, then take 50% of dose.  If your CBG is greater than 220 mg/dL, you may take  of your sliding scale  (correction) dose of insulin.  Reviewed and Endorsed by [facility] Patient Education Committee, August 2015                              You may not have any metal on your body including jewelry, and body piercing             Do not wear lotions, powders, cologne, or deodorant              Men may shave face and neck.   Do not bring valuables to the hospital. [facility] IS NOT             RESPONSIBLE   FOR VALUABLES.   Contacts, glasses, dentures or bridgework may not be worn into surgery.   Bring small overnight bag day of surgery.   DO NOT BRING YOUR HOME MEDICATIONS TO THE HOSPITAL. PHARMACY WILL DISPENSE MEDICATIONS LISTED ON YOUR MEDICATION LIST TO YOU DURING YOUR ADMISSION IN THE HOSPITAL!              Please read over the following fact sheets you were given: IF YOU HAVE QUESTIONS ABOUT YOUR PRE-OP INSTRUCTIONS PLEASE CALL [phone] Contras    If you received a COVID test during your pre-op visit  it is requested that you wear a mask when out in public, stay away from anyone that may not be feeling well and notify your surgeon if you develop symptoms. If you test positive for Covid or have been in contact with anyone that has tested positive in the last 10 days please notify you surgeon.    [facility] - Preparing for Surgery Before surgery, you can play an important role.  Because skin is not sterile, your skin needs to be as free of germs as possible.  You can reduce the number of germs on your skin by washing with CHG (chlorahexidine gluconate) soap before surgery.  CHG is an antiseptic cleaner which kills germs and bonds with the skin to continue killing germs even after washing. Please DO NOT use if you have an allergy to CHG or antibacterial soaps.  If your skin becomes reddened/irritated stop using  the CHG and inform your nurse when you arrive at Short Stay. Do not shave (including legs and underarms) for at least 48 hours prior to the first CHG shower.  You may shave your face/neck.  Please follow these instructions carefully:  1.  Shower with CHG Soap the night before surgery  and the  morning of surgery.  2.  If you choose to wash your hair, wash your hair first as usual with your normal  shampoo.  3.  After you shampoo, rinse your hair and body thoroughly to remove the shampoo.                             4.  Use CHG as you would any other liquid soap.  You can apply chg directly to the skin and wash.  Gently with a scrungie or clean washcloth.  5.  Apply the CHG Soap to your body ONLY FROM THE NECK DOWN.   Do   not use on face/ open                           Wound or open sores. Avoid contact with eyes, ears mouth and   genitals (private parts).                       Wash face,  Genitals (private parts) with your normal soap.             6.  Wash thoroughly, paying special attention to the area where your    surgery  will be performed.  7.  Thoroughly rinse your body with warm water from the neck down.  8.  DO NOT shower/wash with your normal soap after using and rinsing off the CHG Soap.                9.  Pat yourself dry with a clean towel.            10.  Wear clean pajamas.            11.  Place clean sheets on your bed the night of your first shower and do not  sleep with pets. Day of Surgery : Do not apply any lotions/deodorants the morning of surgery.  Please wear clean clothes to the hospital/surgery center.  FAILURE TO FOLLOW THESE INSTRUCTIONS MAY RESULT IN THE CANCELLATION OF YOUR SURGERY  PATIENT SIGNATURE_________________________________  NURSE SIGNATURE__________________________________  ________________________________________________________________________

## 2023-11-18 NOTE — Progress Notes (Signed)
 Patient states that he does not want to spend the night after surgery as he has had bad experiences in the past. Spoke with Radiology and they stated if everything goes well and there are no complications, he has a ride, and someone to be with him he can go home that day but if there are complications he will need to spend the night. Explained this to patient. Interviewed patient over the phone as he has a hard time with transportation due to mobility. Labs will need to be drawn DOS. Emailed instructions to patient. Transferred call to admitting.  COVID Vaccine Completed: no  Date of COVID positive in last 90 days: no  PCP - Glenetta Hew, MD Cardiologist - Thurmon Fair, MD Oncologist- Maricela Bo, MD Urologist- Berniece Salines, MD  Chest x-ray - n/a EKG - 09/21/23 Epic Stress Test - n/a ECHO - 08/06/21 Epic Cardiac Cath - n/a Pacemaker/ICD device last checked: n/a Spinal Cord Stimulator: n/a  Bowel Prep - no  Sleep Study - yes CPAP - no  Fasting Blood Sugar - type 1 DM, 160-200 Checks Blood Sugar 3-4 times a day  Last dose of GLP1 agonist-  N/A GLP1 instructions:  Hold 7 days before surgery    Last dose of SGLT-2 inhibitors-  N/A SGLT-2 instructions:  Hold 3 days before surgery    Blood Thinner Instructions:  Last dose:   Time: Aspirin Instructions: 325 mg, hold 5 days Last Dose:  Activity level: Can perform activities of daily living without stopping and without symptoms of chest pain. Does endorse SOB at times due to not being active. Is bilateral amputee. Left prosthetic works, right does not, he is working on getting new one. Currently in wheelchair. Can transfer from bed to wheel chair independently.  Anesthesia review: CHF, a flutter, CHD, HTN, DM1, a fib, bilateral amputee, bedsore, right leg inflamed by prosthetic, stage 4 bedsore has healed but patient still endorses nerve pain.  Patient denies shortness of breath, fever, cough and chest pain at PAT  appointment  Patient verbalized understanding of instructions that were given to them at the PAT appointment. Patient was also instructed that they will need to review over the PAT instructions again at home before surgery.

## 2023-11-19 ENCOUNTER — Other Ambulatory Visit: Payer: Self-pay

## 2023-11-19 ENCOUNTER — Encounter (HOSPITAL_COMMUNITY)
Admission: RE | Admit: 2023-11-19 | Discharge: 2023-11-19 | Disposition: A | Discharge status: Home / Self Care | Payer: Medicare HMO | Source: Ambulatory Visit | Attending: Physical Medicine and Rehabilitation | Admitting: Physical Medicine and Rehabilitation

## 2023-11-19 ENCOUNTER — Encounter (HOSPITAL_COMMUNITY): Payer: Self-pay

## 2023-11-19 DIAGNOSIS — Z8679 Personal history of other diseases of the circulatory system: Secondary | ICD-10-CM

## 2023-11-19 DIAGNOSIS — N2889 Other specified disorders of kidney and ureter: Secondary | ICD-10-CM

## 2023-11-19 HISTORY — DX: Heart failure, unspecified: I50.9

## 2023-11-19 HISTORY — DX: Post-traumatic stress disorder, unspecified: F43.10

## 2023-11-22 ENCOUNTER — Ambulatory Visit: Admitting: Orthopedic Surgery

## 2023-11-22 DIAGNOSIS — Z89512 Acquired absence of left leg below knee: Secondary | ICD-10-CM | POA: Diagnosis not present

## 2023-11-22 DIAGNOSIS — Z89511 Acquired absence of right leg below knee: Secondary | ICD-10-CM

## 2023-11-22 DIAGNOSIS — M79661 Pain in right lower leg: Secondary | ICD-10-CM

## 2023-11-23 ENCOUNTER — Encounter: Payer: Self-pay | Admitting: Orthopedic Surgery

## 2023-11-23 NOTE — Progress Notes (Signed)
 Office Visit Note   Patient: [name]           Date of Birth: [birth year]           MRN: [id number] Visit Date: 11/22/2023              Requested by: Lula Olszewski, MD [address] [place],  [place] [address] PCP: Lula Olszewski, MD  Chief Complaint  Patient presents with   Right Leg - Follow-up      HPI: Patient is a 58 year old gentleman is presents in follow-up for the right transtibial amputation.  Most recently patient was placed on Trental to help with the vascular changes.  Patient states he has surgery on March 26 to freeze a tumor in his kidney.  Patient states there is no biopsies.  Assessment & Plan: Visit Diagnoses:  1. Pain in right lower leg   2. S/P bilateral below knee amputation [facility])     Plan: Patient will continue with the Trental.  Follow-Up Instructions: Return in about 4 weeks (around 12/20/2023).   Ortho Exam  Patient is alert, oriented, no adenopathy, well-dressed, normal affect, normal respiratory effort. Examination patient has no back pain no radicular symptoms.  He still has pain over the area of the peroneal nerve however a ultrasound-guided injection provided no relief.  Patient's coldness to the extremity is improving he has increased warmth and the brawny skin color changes are improving with decreased redness.  Patient has decreased pain to palpation.  There is still pain to palpation around the area of the peroneal nerve.  Imaging: No results found. No images are attached to the encounter.  Labs: Lab Results  Component Value Date   HGBA1C 8.1 (H) 06/24/2023   HGBA1C 7.1 (H) 12/23/2022   HGBA1C 11.9 (H) 06/15/2022   ESRSEDRATE 25 (H) 07/03/2022   ESRSEDRATE 11 12/30/2021   ESRSEDRATE 12 12/29/2021   CRP <1.0 07/03/2022   CRP 0.7 12/30/2021   CRP 1.1 (H) 12/29/2021   REPTSTATUS 04/11/2022 FINAL 04/09/2022   CULT >=100,000 COLONIES/mL PSEUDOMONAS AERUGINOSA (A) 04/09/2022   LABORGA PSEUDOMONAS AERUGINOSA (A)  04/09/2022     Lab Results  Component Value Date   ALBUMIN 3.7 10/26/2023   ALBUMIN 4.0 07/27/2023   ALBUMIN 4.0 06/24/2023    Lab Results  Component Value Date   MG 1.7 12/18/2021   MG 1.4 (L) 12/16/2021   MG 1.5 (L) 08/15/2021   No results found for: "VD25OH"  No results found for: "PREALBUMIN"    Latest Ref Rng & Units 10/26/2023    2:55 PM 07/27/2023    2:12 PM 06/24/2023    2:11 PM  CBC EXTENDED  WBC 4.0 - 10.5 K/uL 6.4  7.4  6.6   RBC 4.22 - 5.81 MIL/uL 5.76  6.20  6.14   Hemoglobin 13.0 - 17.0 g/dL 63.0  16.0  10.9 Repeated and verified X2.   HCT 39.0 - 52.0 % 51.5  55.0  56.9   Platelets 150 - 400 K/uL 191  201  210.0   NEUT# 1.7 - 7.7 K/uL 5.2  5.7  5.4   Lymph# 0.7 - 4.0 K/uL 0.5  0.9  0.6      There is no height or weight on file to calculate BMI.  Orders:  No orders of the defined types were placed in this encounter.  No orders of the defined types were placed in this encounter.    Procedures: No procedures performed  Clinical Data: No additional findings.  ROS:  All other systems negative, except as noted in the HPI. Review of Systems  Objective: Vital Signs: There were no vitals taken for this visit.  Specialty Comments:  No specialty comments available.  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   Malaise 06/22/2022   Secondary polycythemia 06/22/2022   Amputation below knee (HCC) 04/23/2022   PTSD (post-traumatic stress disorder) 04/23/2022   Generalized anxiety disorder 01/21/2022   Stage 4 skin ulcer of sacral region (HCC) 12/29/2021   Episode of unresponsiveness 12/15/2021   Renal mass 12/15/2021   Stage IV pressure ulcer of sacral region (HCC)  09/15/2021   Insomnia 09/14/2021   Chronic systolic CHF (congestive heart failure) (HCC) 09/14/2021   Hypogonadism in male 09/14/2021   Chronic pain 09/14/2021   Pseudohyponatremia 09/13/2021   Normocytic anemia 09/13/2021   Discitis of thoracic region    Paroxysmal atrial flutter (HCC) 07/22/2021   Chronic diastolic (congestive) heart failure (HCC) 05/16/2021   Lumbar spondylosis 03/25/2021   H/O amputation of leg through tibia and fibula (HCC) 11/10/2019   Degeneration of lumbar intervertebral disc 11/10/2019   Hypercholesterolemia 11/10/2019   S/P bilateral below knee amputation (HCC) 10/30/2019   Dyslipidemia 10/30/2019   Type 1 diabetes mellitus with diabetic polyneuropathy, with long-term current use of insulin (HCC) 10/12/2019   Hypertensive disorder 09/25/2019   Urinary hesitancy 09/25/2019   Prosthesis adjustments 09/25/2019   Erectile dysfunction 09/25/2019   Palliative care status 09/25/2019   Pulmonary nodules 09/25/2019   Depressive disorder 09/25/2019   Past Medical History:  Diagnosis Date   Acquired complex renal cyst 09/25/2019   Acute respiratory failure with hypoxia (HCC) secondary to suspected aspiration pneumonia 12/15/2021   AKI (acute kidney injury) (HCC) 07/22/2021   Cancer (HCC)    renal   Chest pain 07/22/2021   CHF (congestive heart failure) (HCC)    Complication of anesthesia    last time came out angry   Decubitus ulcer of sacral area 12/15/2021   Depression    Diabetes mellitus without complication (HCC)    Diabetic infection of left foot (HCC) 04/17/2021   Episode of unresponsiveness 12/15/2021   Gas gangrene of foot (HCC) 07/22/2021   Healthcare maintenance 09/25/2019   Hematuria 09/25/2019   History of kidney stones    Hypertension    Lactic acidosis 12/15/2021   Leukocytosis 09/13/2021   Osteomyelitis (HCC)    PTSD (post-traumatic stress disorder)    Sacral pressure injury of skin 07/27/2021   Septic shock (HCC) 07/22/2021   Severe  sepsis with acute organ dysfunction (HCC) 07/22/2021   Shortness of breath 05/16/2021   Stage 3a chronic kidney disease (HCC) 09/25/2019   Systolic dysfunction    Type 2 diabetes mellitus 10/12/2019    Family History  Problem Relation Age of Onset   Anxiety disorder Mother    Cancer Father    Coronary artery disease Father    Sleep apnea Neg Hx     Past Surgical History:  Procedure Laterality Date   AMPUTATION Left 07/23/2021   Procedure: LEFT BELOW KNEE AMPUTATION;  Surgeon: Nadara Mustard, MD;  Location: [facility] OR;  Service: Orthopedics;  Laterality: Left;   BELOW KNEE LEG AMPUTATION Right    BUBBLE STUDY  07/29/2021   Procedure:  BUBBLE STUDY;  Surgeon: Quintella Reichert, MD;  Location: [facility] ENDOSCOPY;  Service: Cardiovascular;;   CARDIOVERSION N/A 07/29/2021   Procedure: CARDIOVERSION;  Surgeon: Quintella Reichert, MD;  Location: [facility] ENDOSCOPY;  Service: Cardiovascular;  Laterality: N/A;   IR RADIOLOGIST EVAL & MGMT  10/18/2023   RADIOLOGY WITH ANESTHESIA N/A 08/05/2021   Procedure: MRI LUMBAR WITH AND WITHOUT; THORASIC SPINE WITH AND WITHOUT WITH ANESTHESIA;  Surgeon: Radiologist, Medication, MD;  Location: [facility] OR;  Service: Radiology;  Laterality: N/A;   RADIOLOGY WITH ANESTHESIA N/A 08/07/2021   Procedure: MRI WITH LUMBER WITH AND WITHOUT CONTRAST,THORACIC WITH AND WITHOUT CONTRAST;  Surgeon: Radiologist, Medication, MD;  Location: [facility] OR;  Service: Radiology;  Laterality: N/A;   RADIOLOGY WITH ANESTHESIA N/A 09/13/2021   Procedure: MRI WITH ANESTHESIA;  Surgeon: Julieanne Cotton, MD;  Location: [facility] OR;  Service: Radiology;  Laterality: N/A;   TEE WITHOUT CARDIOVERSION N/A 07/29/2021   Procedure: TRANSESOPHAGEAL ECHOCARDIOGRAM (TEE);  Surgeon: Quintella Reichert, MD;  Location: [facility] ENDOSCOPY;  Service: Cardiovascular;  Laterality: N/A;   Social History   Occupational History   Not on file  Tobacco Use   Smoking status: Never   Smokeless tobacco: Former    Types: Engineer, drilling   Vaping  status: Never Used  Substance and Sexual Activity   Alcohol use: Not Currently   Drug use: Never   Sexual activity: Not Currently

## 2023-11-24 ENCOUNTER — Encounter (HOSPITAL_COMMUNITY): Payer: Self-pay | Admitting: Physician Assistant

## 2023-11-25 ENCOUNTER — Other Ambulatory Visit: Payer: Self-pay | Admitting: Internal Medicine

## 2023-11-25 DIAGNOSIS — T8789 Other complications of amputation stump: Secondary | ICD-10-CM

## 2023-11-25 DIAGNOSIS — M19019 Primary osteoarthritis, unspecified shoulder: Secondary | ICD-10-CM

## 2023-11-26 NOTE — Progress Notes (Signed)
 Anesthesia Chart Review   Date/Time: 12/01/23 0830   Procedure: CT GUIDE TISSUE ABLATION   Diagnosis: Left renal mass [N28.89]   Indications: LT RENAL CRYO   Location: [facility]       DISCUSSION:58 y.o. never smoker with h/o PTSD, below knee amputation bilateral, DM II, CKD Stage III, CHF, left renal mass scheduled for above procedure.   Pt with h/o discitis with epidural abscess in 2022.   Pt last seen by cardiology 09/20/2023. Per OV note CHF stable, no chest pain or shortness of breath.  Echo 2022 with LVEF 35%.  VS: There were no vitals taken for this visit.  PROVIDERS: Lula Olszewski, MD is PCP   Cardiologist:  Thurmon Fair, MD  LABS: Labs reviewed: Acceptable for surgery. (all labs ordered are listed, but only abnormal results are displayed)  Labs Reviewed - No data to display   IMAGES:   EKG:   CV: Echo 07/29/21 1. Left ventricular ejection fraction, by estimation, is 35 to 40%. The  left ventricle has moderately decreased function. The left ventricle  demonstrates global hypokinesis.   2. Right ventricular systolic function is moderately reduced. The right  ventricular size is normal.   3. Left atrial size was mildly dilated. No left atrial/left atrial  appendage thrombus was detected.   4. Right atrial size was mildly dilated.   5. The mitral valve is normal in structure. Trivial mitral valve  regurgitation. No evidence of mitral stenosis.   6. The aortic valve is normal in structure. Aortic valve regurgitation is  trivial. No aortic stenosis is present.   7. The inferior vena cava is normal in size with greater than 50%  respiratory variability, suggesting right atrial pressure of 3 mmHg.   8. Cannot exclude a small PFO.  Past Medical History:  Diagnosis Date   Acquired complex renal cyst 09/25/2019   Acute respiratory failure with hypoxia (HCC) secondary to suspected aspiration pneumonia 12/15/2021   AKI (acute  kidney injury) (HCC) 07/22/2021   Cancer (HCC)    renal   Chest pain 07/22/2021   CHF (congestive heart failure) (HCC)    Complication of anesthesia    last time came out angry   Decubitus ulcer of sacral area 12/15/2021   Depression    Diabetes mellitus without complication (HCC)    Diabetic infection of left foot (HCC) 04/17/2021   Episode of unresponsiveness 12/15/2021   Gas gangrene of foot (HCC) 07/22/2021   Healthcare maintenance 09/25/2019   Hematuria 09/25/2019   History of kidney stones    Hypertension    Lactic acidosis 12/15/2021   Leukocytosis 09/13/2021   Osteomyelitis (HCC)    PTSD (post-traumatic stress disorder)    Sacral pressure injury of skin 07/27/2021   Septic shock (HCC) 07/22/2021   Severe sepsis with acute organ dysfunction (HCC) 07/22/2021   Shortness of breath 05/16/2021   Stage 3a chronic kidney disease (HCC) 09/25/2019   Systolic dysfunction    Type 2 diabetes mellitus 10/12/2019    Past Surgical History:  Procedure Laterality Date   AMPUTATION Left 07/23/2021   Procedure: LEFT BELOW KNEE AMPUTATION;  Surgeon: Nadara Mustard, MD;  Location: [facility] OR;  Service: Orthopedics;  Laterality: Left;   BELOW KNEE LEG AMPUTATION Right    BUBBLE STUDY  07/29/2021   Procedure: BUBBLE STUDY;  Surgeon: Quintella Reichert, MD;  Location: [facility] ENDOSCOPY;  Service: Cardiovascular;;   CARDIOVERSION N/A 07/29/2021   Procedure: CARDIOVERSION;  Surgeon: Quintella Reichert, MD;  Location: [facility] ENDOSCOPY;  Service: Cardiovascular;  Laterality: N/A;   IR RADIOLOGIST EVAL & MGMT  10/18/2023   RADIOLOGY WITH ANESTHESIA N/A 08/05/2021   Procedure: MRI LUMBAR WITH AND WITHOUT; THORASIC SPINE WITH AND WITHOUT WITH ANESTHESIA;  Surgeon: Radiologist, Medication, MD;  Location: [facility] OR;  Service: Radiology;  Laterality: N/A;   RADIOLOGY WITH ANESTHESIA N/A 08/07/2021   Procedure: MRI WITH LUMBER WITH AND WITHOUT CONTRAST,THORACIC WITH AND WITHOUT CONTRAST;  Surgeon: Radiologist, Medication, MD;   Location: [facility] OR;  Service: Radiology;  Laterality: N/A;   RADIOLOGY WITH ANESTHESIA N/A 09/13/2021   Procedure: MRI WITH ANESTHESIA;  Surgeon: Julieanne Cotton, MD;  Location: [facility] OR;  Service: Radiology;  Laterality: N/A;   TEE WITHOUT CARDIOVERSION N/A 07/29/2021   Procedure: TRANSESOPHAGEAL ECHOCARDIOGRAM (TEE);  Surgeon: Quintella Reichert, MD;  Location: [facility] ENDOSCOPY;  Service: Cardiovascular;  Laterality: N/A;    MEDICATIONS:  ACCU-CHEK GUIDE test strip   aspirin EC 325 MG tablet   baclofen (LIORESAL) 10 MG tablet   blood glucose meter kit and supplies KIT   celecoxib (CELEBREX) 100 MG capsule   furosemide (LASIX) 20 MG tablet   insulin NPH-regular Human (NOVOLIN 70/30 RELION) (70-30) 100 UNIT/ML injection   lisinopril (ZESTRIL) 10 MG tablet   Oxycodone HCl 10 MG TABS   pentoxifylline (TRENTAL) 400 MG CR tablet   pregabalin (LYRICA) 75 MG capsule   rosuvastatin (CRESTOR) 20 MG tablet   sildenafil (VIAGRA) 100 MG tablet   tamsulosin (FLOMAX) 0.4 MG CAPS capsule   testosterone cypionate (DEPO-TESTOSTERONE) 200 MG/ML injection   No current facility-administered medications for this encounter.    Jodell Cipro Ward, PA-C [facility] [phone]

## 2023-11-30 ENCOUNTER — Other Ambulatory Visit: Payer: Self-pay | Admitting: Radiology

## 2023-11-30 ENCOUNTER — Encounter (HOSPITAL_COMMUNITY): Payer: Self-pay | Admitting: Certified Registered Nurse Anesthetist

## 2023-11-30 DIAGNOSIS — N2889 Other specified disorders of kidney and ureter: Secondary | ICD-10-CM

## 2023-11-30 NOTE — Anesthesia Preprocedure Evaluation (Signed)
 Anesthesia Evaluation    Reviewed: Allergy & Precautions, Patient's Chart, lab work & pertinent test results  Airway        Dental   Pulmonary           Cardiovascular hypertension, Pt. on medications +CHF    Echo:   1. Left ventricular ejection fraction, by estimation, is 35%. The left  ventricle has moderately decreased function. The left ventricle  demonstrates global hypokinesis. There is mild left ventricular  hypertrophy. Left ventricular diastolic parameters are   indeterminate.   2. Right ventricular systolic function is moderately reduced. The right  ventricular size is normal. There is moderately elevated pulmonary artery  systolic pressure. The estimated right ventricular systolic pressure is  52.9 mmHg.   3. Left atrial size was mildly dilated.   4. Right atrial size was mildly dilated.   5. The mitral valve is normal in structure. Trivial mitral valve  regurgitation. No evidence of mitral stenosis.   6. The aortic valve is tricuspid. Aortic valve regurgitation is not  visualized. No aortic stenosis is present.   7. Aortic dilatation noted. There is mild dilatation of the aortic root,  measuring 39 mm.   8. The inferior vena cava is dilated in size with <50% respiratory  variability, suggesting right atrial pressure of 15 mmHg.   9. No vegetation visualized, but if high suspicion would need TEE.  10. The patient was in atrial fibrillation.     Neuro/Psych  PSYCHIATRIC DISORDERS Anxiety Depression     Neuromuscular disease    GI/Hepatic negative GI ROS, Neg liver ROS,,,  Endo/Other  diabetes, Type 2    Renal/GU CRF and Renal InsufficiencyRenal disease     Musculoskeletal  (+) Arthritis ,    Abdominal   Peds  Hematology  (+) Blood dyscrasia, anemia   Anesthesia Other Findings   Reproductive/Obstetrics                             Anesthesia Physical Anesthesia Plan  ASA:  4  Anesthesia Plan: General   Post-op Pain Management: Minimal or no pain anticipated   Induction: Intravenous  PONV Risk Score and Plan: 3 and Ondansetron, Dexamethasone and Midazolam  Airway Management Planned: Oral ETT  Additional Equipment: Arterial line and ClearSight  Intra-op Plan:   Post-operative Plan: Extubation in OR  Informed Consent:   Plan Discussed with: CRNA  Anesthesia Plan Comments:        Anesthesia Quick Evaluation

## 2023-12-01 ENCOUNTER — Ambulatory Visit (HOSPITAL_COMMUNITY): Payer: Self-pay | Admitting: Anesthesiology

## 2023-12-01 ENCOUNTER — Ambulatory Visit (HOSPITAL_COMMUNITY)
Admission: RE | Admit: 2023-12-01 | Discharge: 2023-12-01 | Disposition: A | Discharge status: Home / Self Care | Payer: Medicare HMO | Source: Ambulatory Visit | Attending: Interventional Radiology | Admitting: Interventional Radiology

## 2023-12-01 ENCOUNTER — Ambulatory Visit (HOSPITAL_COMMUNITY)

## 2023-12-01 ENCOUNTER — Encounter (HOSPITAL_COMMUNITY): Payer: Self-pay

## 2023-12-01 ENCOUNTER — Other Ambulatory Visit: Payer: Self-pay

## 2023-12-01 ENCOUNTER — Encounter (HOSPITAL_COMMUNITY)
Admission: RE | Disposition: A | Discharge status: Home / Self Care | Payer: Self-pay | Source: Ambulatory Visit | Attending: Interventional Radiology

## 2023-12-01 ENCOUNTER — Encounter (HOSPITAL_COMMUNITY): Payer: Self-pay | Admitting: Interventional Radiology

## 2023-12-01 DIAGNOSIS — R9431 Abnormal electrocardiogram [ECG] [EKG]: Secondary | ICD-10-CM | POA: Diagnosis not present

## 2023-12-01 DIAGNOSIS — Z538 Procedure and treatment not carried out for other reasons: Secondary | ICD-10-CM | POA: Diagnosis not present

## 2023-12-01 DIAGNOSIS — I517 Cardiomegaly: Secondary | ICD-10-CM | POA: Diagnosis not present

## 2023-12-01 DIAGNOSIS — Z01818 Encounter for other preprocedural examination: Secondary | ICD-10-CM | POA: Diagnosis not present

## 2023-12-01 DIAGNOSIS — N2889 Other specified disorders of kidney and ureter: Secondary | ICD-10-CM | POA: Insufficient documentation

## 2023-12-01 LAB — CBC WITH DIFFERENTIAL/PLATELET
Abs Immature Granulocytes: 0.07 10*3/uL (ref 0.00–0.07)
Basophils Absolute: 0 10*3/uL (ref 0.0–0.1)
Basophils Relative: 0 %
Eosinophils Absolute: 0.1 10*3/uL (ref 0.0–0.5)
Eosinophils Relative: 1 %
HCT: 53.1 % — ABNORMAL HIGH (ref 39.0–52.0)
Hemoglobin: 17.5 g/dL — ABNORMAL HIGH (ref 13.0–17.0)
Immature Granulocytes: 1 %
Lymphocytes Relative: 11 %
Lymphs Abs: 0.8 10*3/uL (ref 0.7–4.0)
MCH: 30.8 pg (ref 26.0–34.0)
MCHC: 33 g/dL (ref 30.0–36.0)
MCV: 93.3 fL (ref 80.0–100.0)
Monocytes Absolute: 0.7 10*3/uL (ref 0.1–1.0)
Monocytes Relative: 9 %
Neutro Abs: 5.7 10*3/uL (ref 1.7–7.7)
Neutrophils Relative %: 78 %
Platelets: 202 10*3/uL (ref 150–400)
RBC: 5.69 MIL/uL (ref 4.22–5.81)
RDW: 14.1 % (ref 11.5–15.5)
WBC: 7.4 10*3/uL (ref 4.0–10.5)
nRBC: 0 % (ref 0.0–0.2)

## 2023-12-01 LAB — BASIC METABOLIC PANEL
Anion gap: 10 (ref 5–15)
BUN: 19 mg/dL (ref 6–20)
CO2: 23 mmol/L (ref 22–32)
Calcium: 8.9 mg/dL (ref 8.9–10.3)
Chloride: 107 mmol/L (ref 98–111)
Creatinine, Ser: 1.33 mg/dL — ABNORMAL HIGH (ref 0.61–1.24)
GFR, Estimated: >60 mL/min (ref >60)
Glucose, Bld: 82 mg/dL (ref 70–99)
Potassium: 3.7 mmol/L (ref 3.5–5.1)
Sodium: 140 mmol/L (ref 135–145)

## 2023-12-01 LAB — PROTIME-INR
INR: 1 (ref 0.8–1.2)
Prothrombin Time: 13.1 s (ref 11.4–15.2)

## 2023-12-01 LAB — GLUCOSE, CAPILLARY: Glucose-Capillary: 82 mg/dL (ref 70–99)

## 2023-12-01 SURGERY — IR WITH ANESTHESIA
Anesthesia: General

## 2023-12-01 MED ORDER — CHLORHEXIDINE GLUCONATE CLOTH 2 % EX PADS: 6.0000 | MEDICATED_PAD | Freq: Every day | CUTANEOUS | Status: DC

## 2023-12-01 MED ORDER — CHLORHEXIDINE GLUCONATE 0.12 % MT SOLN
15.0000 mL | Freq: Once | OROMUCOSAL | Status: DC
  Filled 2023-12-01: qty 15

## 2023-12-01 MED ORDER — LACTATED RINGERS IV SOLN: INTRAVENOUS | Status: DC

## 2023-12-01 MED ORDER — FENTANYL CITRATE PF 50 MCG/ML IJ SOSY
PREFILLED_SYRINGE | INTRAMUSCULAR | Status: AC
  Filled 2023-12-01: qty 4

## 2023-12-01 MED ORDER — INSULIN ASPART 100 UNIT/ML IJ SOLN: 0.0000 [IU] | INTRAMUSCULAR | Status: DC | PRN

## 2023-12-01 MED ORDER — PHENYLEPHRINE HCL-NACL 20-0.9 MG/250ML-% IV SOLN
INTRAVENOUS | Status: AC
  Filled 2023-12-01: qty 250

## 2023-12-01 MED ORDER — MIDAZOLAM HCL 2 MG/2ML IJ SOLN
INTRAMUSCULAR | Status: AC
  Filled 2023-12-01: qty 2

## 2023-12-01 MED ORDER — ORAL CARE MOUTH RINSE: 15.0000 mL | Freq: Once | OROMUCOSAL | Status: DC

## 2023-12-01 NOTE — Progress Notes (Signed)
 Patient states he ate sausage and bread at 0430 this morning.  Dr. Hart Rochester and Dr. Fredia Sorrow notified.

## 2023-12-01 NOTE — Progress Notes (Signed)
 [name], male   DOB: 1966-07-29, 58 y.o.   MRN: [id number] Pt presented to [facility] today for left renal mass cryoablation/possible bx. Upon questioning pt he stated that he ate solid food at 0430 today. Anesthesia staff /Dr. Fredia Sorrow made aware and procedure cancelled. IR team will coordinate rescheduling of pt for above procedure. Plans d/w pt by Dr. Fredia Sorrow.

## 2023-12-13 ENCOUNTER — Other Ambulatory Visit: Payer: Self-pay | Admitting: Student

## 2023-12-13 DIAGNOSIS — N2889 Other specified disorders of kidney and ureter: Secondary | ICD-10-CM

## 2023-12-13 NOTE — H&P (Shared)
 Chief Complaint: Patient was seen in consultation today for left renal mass, with consideration for cryoablation.  Referring Provider(s): Dr. Berniece Salines, MD   Supervising Physician: Irish Lack  Patient Status: [facility] - Out-pt  Patient is Full Code  History of Present Illness: [name] is a 58 y.o. male  with PMHx notable for HTN, CHF, SOB, T2DM with gangrene of left foot, CKD stage III, complex renal cyst, renal cancer, nephrolithiasis, hematuria, sacral decubitus ulcer, osteomyelitis, septic shock, and depression.  Per Dr. Antonietta Jewel consult note dated 2/21: "[name] is a 58 y.o. male with a known cystic left renal mass since 2020. This was initially detected by imaging in [place] and has been followed by Dr. Marlou Porch since 2021 after the patient moved to [facility] to be closer to his son. The lesion has been fairly stable in size at around 3.5 cm, but was recently upgraded from Bosniak III to IV by CT in December. He is referred to discuss possible percutaneous ablation. He is asymptomatic with regard to the renal mass. He denies any left flank pain or hematuria. He has had prior bilateral below knee amputations. "  Patient is scheduled for left renal mass ablation in IR today. His initial schedule procedure on 3/26 had to be rescheduled as patient was not NPO for surgery.  All labs and medications are within acceptable parameters. No pertinent allergies. Patient has been NPO since midnight.    ***Patient is currently without any significant complaints. Patient is alert and laying in bed, calm. Patient denies any fevers, headache, chest pain, SOB, cough, abdominal pain, nausea, vomiting or bleeding.     Past Medical History:  Diagnosis Date   Acquired complex renal cyst 09/25/2019   Acute respiratory failure with hypoxia (HCC) secondary to suspected aspiration pneumonia 12/15/2021   AKI (acute kidney injury) (HCC) 07/22/2021   Cancer (HCC)     renal   Chest pain 07/22/2021   CHF (congestive heart failure) (HCC)    Complication of anesthesia    last time came out angry   Decubitus ulcer of sacral area 12/15/2021   Depression    Diabetes mellitus without complication (HCC)    Diabetic infection of left foot (HCC) 04/17/2021   Episode of unresponsiveness 12/15/2021   Gas gangrene of foot (HCC) 07/22/2021   Healthcare maintenance 09/25/2019   Hematuria 09/25/2019   History of kidney stones    Hypertension    Lactic acidosis 12/15/2021   Leukocytosis 09/13/2021   Osteomyelitis (HCC)    PTSD (post-traumatic stress disorder)    Sacral pressure injury of skin 07/27/2021   Septic shock (HCC) 07/22/2021   Severe sepsis with acute organ dysfunction (HCC) 07/22/2021   Shortness of breath 05/16/2021   Stage 3a chronic kidney disease (HCC) 09/25/2019   Systolic dysfunction    Type 2 diabetes mellitus 10/12/2019    Past Surgical History:  Procedure Laterality Date   AMPUTATION Left 07/23/2021   Procedure: LEFT BELOW KNEE AMPUTATION;  Surgeon: Nadara Mustard, MD;  Location: [facility] OR;  Service: Orthopedics;  Laterality: Left;   BELOW KNEE LEG AMPUTATION Right    BUBBLE STUDY  07/29/2021   Procedure: BUBBLE STUDY;  Surgeon: Quintella Reichert, MD;  Location: [facility] ENDOSCOPY;  Service: Cardiovascular;;   CARDIOVERSION N/A 07/29/2021   Procedure: CARDIOVERSION;  Surgeon: Quintella Reichert, MD;  Location: [facility] ENDOSCOPY;  Service: Cardiovascular;  Laterality: N/A;   IR RADIOLOGIST EVAL & MGMT  10/18/2023   RADIOLOGY WITH ANESTHESIA N/A  08/05/2021   Procedure: MRI LUMBAR WITH AND WITHOUT; THORASIC SPINE WITH AND WITHOUT WITH ANESTHESIA;  Surgeon: Radiologist, Medication, MD;  Location: [facility] OR;  Service: Radiology;  Laterality: N/A;   RADIOLOGY WITH ANESTHESIA N/A 08/07/2021   Procedure: MRI WITH LUMBER WITH AND WITHOUT CONTRAST,THORACIC WITH AND WITHOUT CONTRAST;  Surgeon: Radiologist, Medication, MD;  Location: [facility] OR;  Service: Radiology;  Laterality:  N/A;   RADIOLOGY WITH ANESTHESIA N/A 09/13/2021   Procedure: MRI WITH ANESTHESIA;  Surgeon: Julieanne Cotton, MD;  Location: [facility] OR;  Service: Radiology;  Laterality: N/A;   TEE WITHOUT CARDIOVERSION N/A 07/29/2021   Procedure: TRANSESOPHAGEAL ECHOCARDIOGRAM (TEE);  Surgeon: Quintella Reichert, MD;  Location: [facility] ENDOSCOPY;  Service: Cardiovascular;  Laterality: N/A;    Allergies: Bactrim [sulfamethoxazole-trimethoprim], Ciprofloxacin, and Levaquin [levofloxacin]  Medications: Prior to Admission medications   Medication Sig Start Date End Date Taking? Authorizing Provider  ACCU-CHEK GUIDE test strip USE UP TO SIX TIMES DAILY TO CHECK BLOOD SUGARS 04/26/23   Lula Olszewski, MD  aspirin EC 325 MG tablet Take 1 tablet (325 mg total) by mouth daily. 07/28/23   Lula Olszewski, MD  baclofen (LIORESAL) 10 MG tablet TAKE 1 TABLET BY MOUTH THREE TIMES DAILY 03/22/23   Lula Olszewski, MD  blood glucose meter kit and supplies KIT Dispense based on patient and insurance preference. Use up to four times daily as directed. 12/05/21   Orion Crook I, NP  celecoxib (CELEBREX) 100 MG capsule Take 1 capsule by mouth twice daily 11/26/23   Lula Olszewski, MD  furosemide (LASIX) 20 MG tablet TAKE 1 TABLET BY MOUTH ONCE DAILY . APPOINTMENT REQUIRED FOR FUTURE REFILLS 09/28/23   Croitoru, Rachelle Hora, MD  insulin NPH-regular Human (NOVOLIN 70/30 RELION) (70-30) 100 UNIT/ML injection Inject 15 Units into the skin 2 (two) times daily with a meal. Patient taking differently: Inject 3-12 Units into the skin 2 (two) times daily with a meal. Sliding scale 12/18/21   Dorcas Carrow, MD  lisinopril (ZESTRIL) 10 MG tablet TAKE 1 TABLET BY MOUTH ONCE DAILY KEEP  JAN  OFFICE  VISIT 11/08/23   Croitoru, Mihai, MD  Oxycodone HCl 10 MG TABS Take 10 mg by mouth 5 (five) times daily. 04/03/22   [provider]  pentoxifylline (TRENTAL) 400 MG CR tablet Take 1 tablet (400 mg total) by mouth 3 (three) times daily with meals.  10/19/23   Nadara Mustard, MD  pregabalin (LYRICA) 75 MG capsule Take 75 mg by mouth 2 (two) times daily.    [provider]  rosuvastatin (CRESTOR) 20 MG tablet Take 1 tablet by mouth once daily 10/10/23   Lula Olszewski, MD  sildenafil (VIAGRA) 100 MG tablet Take 100 mg by mouth daily as needed for erectile dysfunction. 12/15/22   [provider]  tamsulosin (FLOMAX) 0.4 MG CAPS capsule Take 0.4 mg by mouth daily. 04/21/22   [provider]  testosterone cypionate (DEPO-TESTOSTERONE) 200 MG/ML injection Inject 1 mL (200 mg total) into the muscle every 14 (fourteen) days. Significant Risks of heart attack, stroke with continued use. Reducing dose as tolerated highly recommended 06/24/23   Lula Olszewski, MD     Family History  Problem Relation Age of Onset   Anxiety disorder Mother    Cancer Father    Coronary artery disease Father    Sleep apnea Neg Hx     Social History   Socioeconomic History   Marital status: Divorced    Spouse name: Not on file  Number of children: Not on file   Years of education: Not on file   Highest education level: Bachelor's degree (e.g., BA, AB, BS)  Occupational History   Not on file  Tobacco Use   Smoking status: Never   Smokeless tobacco: Former    Types: Engineer, drilling   Vaping status: Never Used  Substance and Sexual Activity   Alcohol use: Not Currently   Drug use: Never   Sexual activity: Not Currently  Other Topics Concern   Not on file  Social History Narrative   Pt lives alone   Pt not working    Social Drivers of Corporate investment banker Strain: Low Risk  (06/20/2023)   Overall Financial Resource Strain (CARDIA)    Difficulty of Paying Living Expenses: Not hard at all  Food Insecurity: No Food Insecurity (07/27/2023)   Hunger Vital Sign    Worried About Running Out of Food in the Last Year: Never true    Ran Out of Food in the Last Year: Never true  Transportation Needs: No Transportation Needs  (07/27/2023)   PRAPARE - Administrator, Civil Service (Medical): No    Lack of Transportation (Non-Medical): No  Physical Activity: Inactive (06/20/2023)   Exercise Vital Sign    Days of Exercise per Week: 0 days    Minutes of Exercise per Session: 0 min  Stress: Stress Concern Present (06/20/2023)   [employer] of Occupational Health - Occupational Stress Questionnaire    Feeling of Stress : Very much  Social Connections: Unknown (06/20/2023)   Social Connection and Isolation Panel [NHANES]    Frequency of Communication with Friends and Family: Once a week    Frequency of Social Gatherings with Friends and Family: Patient declined    Attends Religious Services: Never    Database administrator or Organizations: No    Attends Engineer, structural: Never    Marital Status: Divorced     Review of Systems: A 12 point ROS discussed and pertinent positives are indicated in the HPI above.  All other systems are negative.  Vital Signs: There were no vitals taken for this visit.  Advance Care Plan: The advanced care place/surrogate decision maker was discussed at the time of visit and the patient did not wish to discuss or was not able to name a surrogate decision maker or provide an advance care plan.  Physical Exam  Imaging: DG Chest 1 View Result Date: 12/01/2023 CLINICAL DATA:  Preop chest radiograph.  Left renal mass. EXAM: CHEST  1 VIEW COMPARISON:  Chest radiograph dated 12/28/2021. FINDINGS: No focal consolidation, pleural effusion, pneumothorax. There are bibasilar linear atelectasis/scarring. Mild cardiomegaly. No acute osseous pathology. IMPRESSION: 1. No active disease. 2. Mild cardiomegaly. Electronically Signed   By: Elgie Collard M.D.   On: 12/01/2023 11:22    Labs:  CBC: Recent Labs    06/24/23 1411 07/27/23 1412 10/26/23 1455 12/01/23 0708  WBC 6.6 7.4 6.4 7.4  HGB 18.8 Repeated and verified X2.* 18.5* 17.9* 17.5*  HCT 56.9* 55.0*  51.5 53.1*  PLT 210.0 201 191 202    COAGS: Recent Labs    12/01/23 0708  INR 1.0    BMP: Recent Labs    06/24/23 1411 07/27/23 1412 10/26/23 1455 12/01/23 0708  NA 136 139 140 140  K 4.5 4.2 3.8 3.7  CL 103 104 106 107  CO2 23 30 29 23   GLUCOSE 235* 100* 94 82  BUN 18  18 18 19   CALCIUM 9.4 9.4 9.0 8.9  CREATININE 1.07 1.19 1.30* 1.33*  GFRNONAA  --  >60 >60 >60    LIVER FUNCTION TESTS: Recent Labs    12/23/22 1423 06/24/23 1411 07/27/23 1412 10/26/23 1455  BILITOT 0.9 0.5 0.6 0.5  AST 30 33 32 24  ALT 55* 39 45* 33  ALKPHOS 100 95 85 92  PROT 6.8 6.9 6.7 6.6  ALBUMIN 4.2 4.0 4.0 3.7    TUMOR MARKERS: No results for input(s): "AFPTM", "CEA", "CA199", "CHROMGRNA" in the last 8760 hours.  Assessment and Plan: Per Dr. Antonietta Jewel consult note on 2/10: "Given appearance, I discussed treatment options with Mr. Tomasik including partial nephrectomy and percutaneous ablation. The lesion is in a location and of size amenable to percutaneous cryoablation. I don't think biopsy is necessary prior to ablation given potential for false negative sampling of a more cystic area, and biopsy could be done at the time of ablation. Dr. Marlou Porch has told Mr. Krisher that he is not a good candidate for partial nephrectomy given his prior bilateral BKA, lack of right prosthesis and current dependence on a wheelchair full time for ambulation.   [...] Mr. Tallman would like to proceed with scheduling cryoablation of the left renal mass."  Patient presents for scheduled left renal mass ablation in IR today.  Risks and benefits of image guided renal cryoablation was discussed with the patient including, but not limited to, failure to treat entire lesion, bleeding, infection, damage to adjacent structures, hematuria, urine leak, decrease in renal function or post procedural neuropathy.  All of the patient's questions were answered and the patient is agreeable to proceed.  Consent signed  and in chart.      Thank you for allowing our service to participate in Micah Barnier 's care.  Electronically Signed: Sable Feil, PA-C   12/13/2023, 4:55 PM      I spent a total of 25 Minutes in face to face in clinical consultation, greater than 50% of which was counseling/coordinating care for left renal mass, with consideration for cryoablation.

## 2023-12-13 NOTE — Progress Notes (Signed)
 Patient for CT guided LT Renal Cryo Ablation on Tues 12/14/23, I called and spoke with the patient on the phone and gave pre-procedure instructions. Pt was made aware to be here at 7:30a, last dose of ASA 81mg  on Wed 12/08/23, NPO after MN prior to procedure as well as driver post procedure/recovery/discharge. Pt stated understanding.  Called 12/03/23 and 12/13/23

## 2023-12-14 ENCOUNTER — Encounter: Payer: Self-pay | Admitting: Anesthesiology

## 2023-12-14 ENCOUNTER — Ambulatory Visit
Admission: RE | Admit: 2023-12-14 | Discharge: 2023-12-14 | Disposition: A | Discharge status: Home / Self Care | Source: Ambulatory Visit | Attending: Interventional Radiology | Admitting: Interventional Radiology

## 2023-12-14 NOTE — Progress Notes (Signed)
  IR BRIEF NOTE:  Patient was rescheduled at [facility] today for left renal mass cryoablation/possible biopsy. Patient did not make his appointment, and was unreachable by phone with multiple attempts made. Next of kin was unreachable as well.   Dr. Fredia Sorrow was made aware and procedure was cancelled again. IR team will attempt to coordinate rescheduling the patient for above procedure again.    Electronically Signed: Sable Feil, PA-C 12/14/2023, 11:07 AM

## 2023-12-21 ENCOUNTER — Ambulatory Visit: Admitting: Orthopedic Surgery

## 2023-12-21 DIAGNOSIS — M79661 Pain in right lower leg: Secondary | ICD-10-CM | POA: Diagnosis not present

## 2023-12-21 DIAGNOSIS — Z89512 Acquired absence of left leg below knee: Secondary | ICD-10-CM

## 2023-12-21 DIAGNOSIS — Z89511 Acquired absence of right leg below knee: Secondary | ICD-10-CM | POA: Diagnosis not present

## 2023-12-22 ENCOUNTER — Telehealth: Payer: Self-pay

## 2023-12-22 NOTE — Telephone Encounter (Signed)
 Spoke to pt about Contractor stated he does not no longer need help on getting one. He bought one from a neighbor close to him and is doing fine he stated over the phone.

## 2023-12-24 ENCOUNTER — Encounter: Payer: Self-pay | Admitting: Orthopedic Surgery

## 2023-12-24 NOTE — Progress Notes (Signed)
 Office Visit Note   Patient: [name]           Date of Birth: 1966/07/12           MRN: [id number] Visit Date: 12/21/2023              Requested by: Anthon Kins, MD [address] [place],  [place] [address] PCP: Anthon Kins, MD  Chief Complaint  Patient presents with   Right Leg - Follow-up      HPI: Patient is a 58 year old gentleman who presents in follow-up with persistent pain and discoloration of the right below-knee amputation.  Assessment & Plan: Visit Diagnoses:  1. S/P bilateral below knee amputation (HCC)   2. Pain in right lower leg     Plan: Continue with the shrinker.  Discussed the patient would benefit from a vacuum socket on the right.  Patient was provided a prescription for a new vacuum socket on the right.  Follow-Up Instructions: No follow-ups on file.   Ortho Exam  Patient is alert, oriented, no adenopathy, well-dressed, normal affect, normal respiratory effort. Examination of the redness and swelling is resolving in the right lower extremity there is no cellulitis no knee effusion.  The skin color returns to normal with massage.  There is no pain to palpation over the anterior compartment.  Patient is an existing right transtibial  amputee.  Patient's current comorbidities are not expected to impact the ability to function with the prescribed prosthesis. Patient verbally communicates a strong desire to use a prosthesis. Patient currently requires mobility aids to ambulate without a prosthesis.  Expects not to use mobility aids with a new prosthesis. Patient is expected to resume or reach their K Level within 6 months. Patient was active before the amputation and independent with stairs, uneven terrain, varying cadence, and a community ambulator.  Patient is a K3 level ambulator that spends a lot of time walking around on uneven terrain over obstacles, up and down stairs, and ambulates with a variable cadence.     Imaging: No  results found. No images are attached to the encounter.  Labs: Lab Results  Component Value Date   HGBA1C 8.1 (H) 06/24/2023   HGBA1C 7.1 (H) 12/23/2022   HGBA1C 11.9 (H) 06/15/2022   ESRSEDRATE 25 (H) 07/03/2022   ESRSEDRATE 11 12/30/2021   ESRSEDRATE 12 12/29/2021   CRP <1.0 07/03/2022   CRP 0.7 12/30/2021   CRP 1.1 (H) 12/29/2021   REPTSTATUS 04/11/2022 FINAL 04/09/2022   CULT >=100,000 COLONIES/mL PSEUDOMONAS AERUGINOSA (A) 04/09/2022   LABORGA PSEUDOMONAS AERUGINOSA (A) 04/09/2022     Lab Results  Component Value Date   ALBUMIN 3.7 10/26/2023   ALBUMIN 4.0 07/27/2023   ALBUMIN 4.0 06/24/2023    Lab Results  Component Value Date   MG 1.7 12/18/2021   MG 1.4 (L) 12/16/2021   MG 1.5 (L) 08/15/2021   No results found for: "VD25OH"  No results found for: "PREALBUMIN"    Latest Ref Rng & Units 12/01/2023    7:08 AM 10/26/2023    2:55 PM 07/27/2023    2:12 PM  CBC EXTENDED  WBC 4.0 - 10.5 K/uL 7.4  6.4  7.4   RBC 4.22 - 5.81 MIL/uL 5.69  5.76  6.20   Hemoglobin 13.0 - 17.0 g/dL 62.1  30.8  65.7   HCT 39.0 - 52.0 % 53.1  51.5  55.0   Platelets 150 - 400 K/uL 202  191  201   NEUT# 1.7 -  7.7 K/uL 5.7  5.2  5.7   Lymph# 0.7 - 4.0 K/uL 0.8  0.5  0.9      There is no height or weight on file to calculate BMI.  Orders:  No orders of the defined types were placed in this encounter.  No orders of the defined types were placed in this encounter.    Procedures: No procedures performed  Clinical Data: No additional findings.  ROS:  All other systems negative, except as noted in the HPI. Review of Systems  Objective: Vital Signs: There were no vitals taken for this visit.  Specialty Comments:  No specialty comments available.  PMFS History: Patient Active Problem List   Diagnosis Date Noted   Adverse reaction to intramuscular testosterone  08/11/2023   History of atrial fibrillation 08/11/2023   Shoulder arthritis 07/28/2023   Amputation stump pain  (HCC) 07/28/2023   Chronic back pain 12/24/2022   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HCC) 07/24/2022   Malaise 06/22/2022   Secondary polycythemia 06/22/2022   Amputation below knee (HCC) 04/23/2022   PTSD (post-traumatic stress disorder) 04/23/2022   Generalized anxiety disorder 01/21/2022   Stage 4 skin ulcer of sacral region (HCC) 12/29/2021   Episode of unresponsiveness 12/15/2021   Renal mass 12/15/2021   Stage IV pressure ulcer of sacral region (HCC) 09/15/2021   Insomnia 09/14/2021   Chronic systolic CHF (congestive heart failure) (HCC) 09/14/2021   Hypogonadism in male 09/14/2021   Chronic pain 09/14/2021   Pseudohyponatremia 09/13/2021   Normocytic anemia 09/13/2021   Discitis of thoracic region    Paroxysmal atrial flutter (HCC) 07/22/2021   Chronic diastolic (congestive) heart failure (HCC) 05/16/2021   Lumbar spondylosis 03/25/2021   H/O amputation of leg through tibia and fibula (HCC) 11/10/2019   Degeneration of lumbar intervertebral disc 11/10/2019   Hypercholesterolemia 11/10/2019   S/P bilateral below knee amputation (HCC) 10/30/2019   Dyslipidemia 10/30/2019   Type 1 diabetes mellitus with diabetic polyneuropathy, with long-term current use of insulin  (HCC) 10/12/2019   Hypertensive disorder 09/25/2019   Urinary hesitancy 09/25/2019   Prosthesis adjustments 09/25/2019   Erectile dysfunction 09/25/2019   Palliative care status 09/25/2019   Pulmonary nodules 09/25/2019   Depressive disorder 09/25/2019   Past Medical History:  Diagnosis Date   Acquired complex renal cyst 09/25/2019   Acute respiratory failure with hypoxia (HCC) secondary to suspected aspiration pneumonia 12/15/2021   AKI (acute kidney injury) (HCC) 07/22/2021   Cancer (HCC)    renal   Chest pain 07/22/2021   CHF (congestive heart failure) (HCC)    Complication of anesthesia    last time came out angry   Decubitus ulcer of sacral area 12/15/2021   Depression    Diabetes mellitus without  complication (HCC)    Diabetic infection of left foot (HCC) 04/17/2021   Episode of unresponsiveness 12/15/2021   Gas gangrene of foot (HCC) 07/22/2021   Healthcare maintenance 09/25/2019   Hematuria 09/25/2019   History of kidney stones    Hypertension    Lactic acidosis 12/15/2021   Leukocytosis 09/13/2021   Osteomyelitis (HCC)    PTSD (post-traumatic stress disorder)    Sacral pressure injury of skin 07/27/2021   Septic shock (HCC) 07/22/2021   Severe sepsis with acute organ dysfunction (HCC) 07/22/2021   Shortness of breath 05/16/2021   Stage 3a chronic kidney disease (HCC) 09/25/2019   Systolic dysfunction    Type 2 diabetes mellitus 10/12/2019    Family History  Problem Relation Age of Onset   Anxiety disorder Mother  Cancer Father    Coronary artery disease Father    Sleep apnea Neg Hx     Past Surgical History:  Procedure Laterality Date   AMPUTATION Left 07/23/2021   Procedure: LEFT BELOW KNEE AMPUTATION;  Surgeon: Timothy Ford, MD;  Location: [facility] OR;  Service: Orthopedics;  Laterality: Left;   BELOW KNEE LEG AMPUTATION Right    BUBBLE STUDY  07/29/2021   Procedure: BUBBLE STUDY;  Surgeon: Jacqueline Matsu, MD;  Location: [facility] ENDOSCOPY;  Service: Cardiovascular;;   CARDIOVERSION N/A 07/29/2021   Procedure: CARDIOVERSION;  Surgeon: Jacqueline Matsu, MD;  Location: [facility] ENDOSCOPY;  Service: Cardiovascular;  Laterality: N/A;   IR RADIOLOGIST EVAL & MGMT  10/18/2023   RADIOLOGY WITH ANESTHESIA N/A 08/05/2021   Procedure: MRI LUMBAR WITH AND WITHOUT; THORASIC SPINE WITH AND WITHOUT WITH ANESTHESIA;  Surgeon: Radiologist, Medication, MD;  Location: [facility] OR;  Service: Radiology;  Laterality: N/A;   RADIOLOGY WITH ANESTHESIA N/A 08/07/2021   Procedure: MRI WITH LUMBER WITH AND WITHOUT CONTRAST,THORACIC WITH AND WITHOUT CONTRAST;  Surgeon: Radiologist, Medication, MD;  Location: [facility] OR;  Service: Radiology;  Laterality: N/A;   RADIOLOGY WITH ANESTHESIA N/A 09/13/2021   Procedure: MRI  WITH ANESTHESIA;  Surgeon: Luellen Sages, MD;  Location: [facility] OR;  Service: Radiology;  Laterality: N/A;   TEE WITHOUT CARDIOVERSION N/A 07/29/2021   Procedure: TRANSESOPHAGEAL ECHOCARDIOGRAM (TEE);  Surgeon: Jacqueline Matsu, MD;  Location: [facility] ENDOSCOPY;  Service: Cardiovascular;  Laterality: N/A;   Social History   Occupational History   Not on file  Tobacco Use   Smoking status: Never   Smokeless tobacco: Former    Types: Engineer, drilling   Vaping status: Never Used  Substance and Sexual Activity   Alcohol use: Not Currently   Drug use: Never   Sexual activity: Not Currently

## 2023-12-27 ENCOUNTER — Ambulatory Visit: Payer: Medicare HMO | Admitting: Internal Medicine

## 2023-12-29 ENCOUNTER — Encounter: Payer: Self-pay | Admitting: Internal Medicine

## 2023-12-29 ENCOUNTER — Ambulatory Visit (INDEPENDENT_AMBULATORY_CARE_PROVIDER_SITE_OTHER): Admitting: Internal Medicine

## 2023-12-29 VITALS — BP 134/82 | HR 75 | Temp 98.1°F

## 2023-12-29 DIAGNOSIS — L89154 Pressure ulcer of sacral region, stage 4: Secondary | ICD-10-CM

## 2023-12-29 DIAGNOSIS — T8789 Other complications of amputation stump: Secondary | ICD-10-CM | POA: Diagnosis not present

## 2023-12-29 DIAGNOSIS — R29818 Other symptoms and signs involving the nervous system: Secondary | ICD-10-CM | POA: Diagnosis not present

## 2023-12-29 DIAGNOSIS — D751 Secondary polycythemia: Secondary | ICD-10-CM

## 2023-12-29 DIAGNOSIS — E291 Testicular hypofunction: Secondary | ICD-10-CM | POA: Diagnosis not present

## 2023-12-29 DIAGNOSIS — N2889 Other specified disorders of kidney and ureter: Secondary | ICD-10-CM | POA: Diagnosis not present

## 2023-12-29 DIAGNOSIS — M5136 Other intervertebral disc degeneration, lumbar region with discogenic back pain only: Secondary | ICD-10-CM

## 2023-12-29 DIAGNOSIS — M79609 Pain in unspecified limb: Secondary | ICD-10-CM

## 2023-12-29 MED ORDER — TESTOSTERONE CYPIONATE 200 MG/ML IM SOLN: 200.0000 mg | INTRAMUSCULAR | 0 refills | Status: DC

## 2023-12-29 NOTE — Assessment & Plan Note (Signed)
 Persistent pain and inflammation at the amputation site affect prosthetic use. He is frustrated with the lack of improvement and prosthetic adjustments, as current management is ineffective.

## 2023-12-29 NOTE — Assessment & Plan Note (Signed)
 He has chronic polycythemia with high hemoglobin levels. Aspirin  325 mg is used to reduce stroke risk, which he understands and accepts. Continue aspirin  325 mg. Likely underlying obstructive sleep apnea but he strongly defers/declines recommendations today, and has full decision making capacity recommendations to get sleep evaluated by any means. Encouraged home sleep test and apple watch and encouraged that he won't necessarily need CPAP- told about inspire

## 2023-12-29 NOTE — Patient Instructions (Signed)
 VISIT SUMMARY:  During your visit, we discussed several ongoing health issues, including your kidney tumor, prosthetic complications, back pain, and other concerns. We reviewed your current treatments and made plans for managing each condition moving forward.  YOUR PLAN:  -KIDNEY TUMOR: A kidney tumor is an abnormal growth in the kidney. You are scheduled for cryoablation on May 6th at [facility] to address the tumor, which will be frozen as it cannot be removed.  -POLYCYTHEMIA WITH ELEVATED ERYTHROPOIETIN : Polycythemia is a condition where your body produces too many red blood cells, leading to high hemoglobin levels. Continue taking aspirin  325 mg daily to reduce the risk of stroke.  -OBSTRUCTIVE SLEEP APNEA: Obstructive sleep apnea is a condition where your breathing stops and starts during sleep. You have declined further testing and treatment options, but please be aware of the risks, including dementia and stroke.  -CHRONIC BACK PAIN: Chronic back pain is long-lasting pain in your back. Your limited mobility is making it worse and contributing to weight gain. You have declined weight loss medication and physical therapy, so managing your weight will be challenging.  -AMPUTATION STUMP PAIN: You are experiencing pain and inflammation at the site of your amputation, which is affecting your ability to use your prosthetic limb. Current management has been ineffective, and we will need to explore other options.  -PRESSURE ULCER: A pressure ulcer is a sore that develops from prolonged pressure on the skin. Your stage IV pressure ulcer in the sacral region is now closed but still painful. You are using a pillow for comfort.  -TESTOSTERONE  THERAPY: You are receiving testosterone  therapy to manage your emotional well-being. Continue with testosterone  cipionate 200 mg every 14 days and plan to check your levels in six months, ensuring the test is done 14 days after your  injection.  INSTRUCTIONS:  You are scheduled for cryoablation on May 6th at [facility] to address your kidney tumor. Continue taking aspirin  325 mg daily. Follow up in six months for testosterone  level testing, ensuring the test is done 14 days after your injection.

## 2023-12-29 NOTE — Assessment & Plan Note (Signed)
 Limited mobility exacerbates his back pain and hinders exercise, leading to weight gain. He declined weight loss medication and physical therapy, aware of the challenges in managing weight.

## 2023-12-29 NOTE — Assessment & Plan Note (Signed)
 The renal tumor extends into the kidney, causing fatigue. He postponed cryoablation twice due to logistical issues but is now ready to proceed. Cryoablation is scheduled for May 6th at [facility].

## 2023-12-29 NOTE — Assessment & Plan Note (Signed)
 He receives testosterone  cipionate 200 mg every 14 days, understanding the risks of increased hemoglobin and potential exacerbation of sleep apnea. He prioritizes emotional well-being and declined current testosterone  level testing, planning to check levels in six months. Continue testosterone  cipionate 200 mg every 14 days and check levels in six months, ensuring a trough level by scheduling 14 days post-injection. Although I make every effort to build our relationship and provide more comprehensive primary care, this patient has been through a lot of medical trauma and today only wants testosterone  replacement therapy- reports this is critical to maintaining his mood and emotional wellbeing.

## 2023-12-29 NOTE — Assessment & Plan Note (Signed)
 A stage IV pressure ulcer in the sacral region is now closed but remains painful. He uses a pillow for comfort and reports significant discomfort.

## 2023-12-29 NOTE — Progress Notes (Signed)
 ==============================  [place] [facility] [facility]: [phone]   -- Medical Office Visit --  Patient: [name]      Age: 58 y.o.       Sex:  male  Date:   12/29/2023 Today's Healthcare Provider: Anthon Kins, MD  ==============================   Chief Complaint: Medical Management of Chronic Issues (5 month follow up. Tumor surgery on 01/11/2024 with [facility]. )  History of Present Illness 58 year old male with advanced degenerative disc disease and a kidney tumor who presents for follow-up on multiple health issues including prosthetic complications and back pain.  He is experiencing ongoing issues with his prosthetic limb, noting significant redness and inflammation. Despite being prescribed pentoxifylline , he continues to experience discomfort and is unable to use the prosthetic effectively. He expresses frustration with the situation, stating 'I'm never gonna walk.'  He describes worsening back pain, attributing it to his advanced degenerative disc disease. The pain is exacerbated by his inability to sit comfortably in a wheelchair or scooter, significantly impacting his mobility and quality of life. He reports gaining weight, particularly around his stomach, due to his limited physical activity.  He is scheduled for surgery on May 6th to address a tumor that is growing into his kidney. He has previously postponed this procedure twice due to logistical issues. He mentions that the tumor will be frozen as it cannot be removed.  He has a history of a pressure ulcer which, although closed, continues to cause significant pain. He manages the discomfort by adjusting his seating position and using a pillow for support.  He is currently on testosterone  cipionate, administered every 14 days at a dose of 200 mg. Without testosterone , he experiences significant emotional distress, stating 'if I don't take it, I'm bad.' He acknowledges the associated  risks, including potential impacts on his hemoglobin levels and sleep apnea.  He has declined further evaluation for sleep apnea due to logistical challenges and personal preference, despite having high hemoglobin levels and a history of fatigue. \ Background Reviewed: Problem List: has Hypertensive disorder; Urinary hesitancy; Prosthesis adjustments; Erectile dysfunction; Palliative care status; Pulmonary nodules; Depressive disorder; Type 1 diabetes mellitus with diabetic polyneuropathy, with long-term current use of insulin  (HCC); S/P bilateral below knee amputation (HCC); Dyslipidemia; Chronic diastolic (congestive) heart failure (HCC); Paroxysmal atrial flutter (HCC); Discitis of thoracic region; Pseudohyponatremia; Normocytic anemia; Insomnia; Chronic systolic CHF (congestive heart failure) (HCC); Hypogonadism in male; Chronic pain; Stage IV pressure ulcer of sacral region [facility]); Episode of unresponsiveness; Renal mass; Stage 4 skin ulcer of sacral region [facility]); Generalized anxiety disorder; Amputation below knee (HCC); H/O amputation of leg through tibia and fibula (HCC); Degeneration of lumbar intervertebral disc; Hypercholesterolemia; Lumbar spondylosis; PTSD (post-traumatic stress disorder); Malaise; Secondary polycythemia; 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HCC); Chronic back pain; Shoulder arthritis; Amputation stump pain (HCC); Adverse reaction to intramuscular testosterone ; and History of atrial fibrillation on their problem list. Medications:  has a current medication list which includes the following prescription(s): accu-chek guide, aspirin  ec, baclofen , blood glucose meter kit and supplies, celecoxib , furosemide , novolin  70/30 relion, lisinopril , oxycodone  hcl, pentoxifylline , pregabalin, rosuvastatin , sildenafil , tamsulosin , and testosterone  cypionate.  Allergies:  is allergic to bactrim [sulfamethoxazole-trimethoprim], ciprofloxacin , and levaquin [levofloxacin].  Past Medical History:  has a past  medical history of Acquired complex renal cyst (09/25/2019), Acute respiratory failure with hypoxia (HCC) secondary to suspected aspiration pneumonia (12/15/2021), AKI (acute kidney injury) (HCC) (07/22/2021), Cancer (HCC), Chest pain (07/22/2021), CHF (congestive heart failure) (HCC), Complication of anesthesia, Decubitus ulcer of  sacral area (12/15/2021), Depression, Diabetes mellitus without complication (HCC), Diabetic infection of left foot (HCC) (04/17/2021), Episode of unresponsiveness (12/15/2021), Gas gangrene of foot (HCC) (07/22/2021), Healthcare maintenance (09/25/2019), Hematuria (09/25/2019), History of kidney stones, Hypertension, Lactic acidosis (12/15/2021), Leukocytosis (09/13/2021), Osteomyelitis (HCC), PTSD (post-traumatic stress disorder), Sacral pressure injury of skin (07/27/2021), Septic shock (HCC) (07/22/2021), Severe sepsis with acute organ dysfunction (HCC) (07/22/2021), Shortness of breath (05/16/2021), Stage 3a chronic kidney disease (HCC) (09/25/2019), Systolic dysfunction, and Type 2 diabetes mellitus (10/12/2019). Past Surgical History:   has a past surgical history that includes Below knee leg amputation (Right); Amputation (Left, 07/23/2021); TEE without cardioversion (N/A, 07/29/2021); Cardioversion (N/A, 07/29/2021); Bubble study (07/29/2021); Radiology with anesthesia (N/A, 08/05/2021); Radiology with anesthesia (N/A, 08/07/2021); Radiology with anesthesia (N/A, 09/13/2021); and IR Radiologist Eval & Mgmt (10/18/2023). Social History:   reports that he has never smoked. He has quit using smokeless tobacco.  His smokeless tobacco use included chew. He reports that he does not currently use alcohol. He reports that he does not use drugs. Family History:  family history includes Anxiety disorder in his mother; Cancer in his father; Coronary artery disease in his father. Depression Screen and Health Maintenance:    07/27/2023    2:41 PM 07/27/2023    1:42 PM 03/22/2023    2:58  PM 12/23/2022    1:36 PM  PHQ 2/9 Scores  PHQ - 2 Score 4 4 4 6   PHQ- 9 Score 10 10 14 16     Medication Reconciliation: Current Outpatient Medications on File Prior to Visit  Medication Sig   ACCU-CHEK GUIDE test strip USE UP TO SIX TIMES DAILY TO CHECK BLOOD SUGARS   aspirin  EC 325 MG tablet Take 1 tablet (325 mg total) by mouth daily.   baclofen  (LIORESAL ) 10 MG tablet TAKE 1 TABLET BY MOUTH THREE TIMES DAILY   blood glucose meter kit and supplies KIT Dispense based on patient and insurance preference. Use up to four times daily as directed.   celecoxib  (CELEBREX ) 100 MG capsule Take 1 capsule by mouth twice daily   furosemide  (LASIX ) 20 MG tablet TAKE 1 TABLET BY MOUTH ONCE DAILY . APPOINTMENT REQUIRED FOR FUTURE REFILLS   insulin  NPH-regular Human (NOVOLIN  70/30 RELION) (70-30) 100 UNIT/ML injection Inject 15 Units into the skin 2 (two) times daily with a meal. (Patient taking differently: Inject 3-12 Units into the skin 2 (two) times daily with a meal. Sliding scale)   lisinopril  (ZESTRIL ) 10 MG tablet TAKE 1 TABLET BY MOUTH ONCE DAILY KEEP  JAN  OFFICE  VISIT   Oxycodone  HCl 10 MG TABS Take 10 mg by mouth 5 (five) times daily.   pentoxifylline  (TRENTAL ) 400 MG CR tablet Take 1 tablet (400 mg total) by mouth 3 (three) times daily with meals.   pregabalin (LYRICA) 75 MG capsule Take 75 mg by mouth 2 (two) times daily.   rosuvastatin  (CRESTOR ) 20 MG tablet Take 1 tablet by mouth once daily   sildenafil  (VIAGRA ) 100 MG tablet Take 100 mg by mouth daily as needed for erectile dysfunction.   tamsulosin  (FLOMAX ) 0.4 MG CAPS capsule Take 0.4 mg by mouth daily.   No current facility-administered medications on file prior to visit.   Medications Discontinued During This Encounter  Medication Reason   testosterone  cypionate (DEPO-TESTOSTERONE ) 200 MG/ML injection Reorder        Physical Exam:    12/29/2023    2:14 PM 12/01/2023    7:38 AM 12/01/2023    7:12 AM  Vitals with  BMI   Height  6\' 3"    Weight  225 lbs   BMI  28.12   Systolic 134  117  Diastolic 82  80  Pulse 75  84   Wt Readings from Last 10 Encounters:  12/01/23 225 lb (102.1 kg)  09/20/23 220 lb (99.8 kg)  08/11/23 228 lb (103.4 kg)  03/22/23 202 lb (91.6 kg)  06/15/22 202 lb (91.6 kg)  01/21/22 208 lb (94.3 kg)  12/31/21 208 lb (94.3 kg)  09/14/21 240 lb 4.8 oz (109 kg)  08/01/21 235 lb 14.3 oz (107 kg)  05/16/21 240 lb (108.9 kg)  Vital signs reviewed.  Nursing notes reviewed. Weight trend reviewed. Pulmonary:  Normal work of breathing at rest, no respiratory distress apparent. SpO2: 98 %  Musculoskeletal: Below knee amputation of left lower extremity is noted. Below knee amputation of right lower extremity is noted. He has redness and tenderness of the right below knee amputation stump.  Left below knee amputation noted.  Neurological:  Awake, alert, oriented, and engaged.  No obvious focal neurological deficits or cognitive impairments.  Sensorium seems unclouded.   Speech is clear and coherent with logical content. Psychiatric:  Appropriate mood, pleasant and cooperative demeanor, thoughtful and engaged during the exam    No results found for any visits on 12/29/23. Admission on 12/01/2023, Discharged on 12/01/2023  Component Date Value   Glucose-Capillary 12/01/2023 82    Comment 1 12/01/2023 Notify RN    Comment 2 12/01/2023 Document in Chart   Hospital Outpatient Visit on 12/01/2023  Component Date Value   WBC 12/01/2023 7.4    RBC 12/01/2023 5.69    Hemoglobin 12/01/2023 17.5 (H)    HCT 12/01/2023 53.1 (H)    MCV 12/01/2023 93.3    MCH 12/01/2023 30.8    MCHC 12/01/2023 33.0    RDW 12/01/2023 14.1    Platelets 12/01/2023 202    nRBC 12/01/2023 0.0    Neutrophils Relative % 12/01/2023 78    Neutro Abs 12/01/2023 5.7    Lymphocytes Relative 12/01/2023 11    Lymphs Abs 12/01/2023 0.8    Monocytes Relative 12/01/2023 9    Monocytes Absolute 12/01/2023 0.7    Eosinophils  Relative 12/01/2023 1    Eosinophils Absolute 12/01/2023 0.1    Basophils Relative 12/01/2023 0    Basophils Absolute 12/01/2023 0.0    Immature Granulocytes 12/01/2023 1    Abs Immature Granulocytes 12/01/2023 0.07    Sodium 12/01/2023 140    Potassium 12/01/2023 3.7    Chloride 12/01/2023 107    CO2 12/01/2023 23    Glucose, Bld 12/01/2023 82    BUN 12/01/2023 19    Creatinine, Ser 12/01/2023 1.33 (H)    Calcium  12/01/2023 8.9    GFR, Estimated 12/01/2023 >60    Anion gap 12/01/2023 10    Prothrombin Time 12/01/2023 13.1    INR 12/01/2023 1.0   Scanned Document on 11/10/2023  Component Date Value   HM Diabetic Eye Exam 07/19/2023 No Retinopathy   Appointment on 10/26/2023  Component Date Value   Erythropoietin  10/26/2023 23.4 (H)    Sodium 10/26/2023 140    Potassium 10/26/2023 3.8    Chloride 10/26/2023 106    CO2 10/26/2023 29    Glucose, Bld 10/26/2023 94    BUN 10/26/2023 18    Creatinine 10/26/2023 1.30 (H)    Calcium  10/26/2023 9.0    Total Protein 10/26/2023 6.6    Albumin 10/26/2023 3.7    AST 10/26/2023 24  ALT 10/26/2023 33    Alkaline Phosphatase 10/26/2023 92    Total Bilirubin 10/26/2023 0.5    GFR, Estimated 10/26/2023 >60    Anion gap 10/26/2023 5    WBC Count 10/26/2023 6.4    RBC 10/26/2023 5.76    Hemoglobin 10/26/2023 17.9 (H)    HCT 10/26/2023 51.5    MCV 10/26/2023 89.4    MCH 10/26/2023 31.1    MCHC 10/26/2023 34.8    RDW 10/26/2023 15.4    Platelet Count 10/26/2023 191    nRBC 10/26/2023 0.0    Neutrophils Relative % 10/26/2023 81    Neutro Abs 10/26/2023 5.2    Lymphocytes Relative 10/26/2023 8    Lymphs Abs 10/26/2023 0.5 (L)    Monocytes Relative 10/26/2023 9    Monocytes Absolute 10/26/2023 0.6    Eosinophils Relative 10/26/2023 1    Eosinophils Absolute 10/26/2023 0.1    Basophils Relative 10/26/2023 0    Basophils Absolute 10/26/2023 0.0    Immature Granulocytes 10/26/2023 1    Abs Immature Granulocytes 10/26/2023 0.04    Appointment on 07/27/2023  Component Date Value   Erythropoietin  07/27/2023 40.7 (H)    LDH 07/27/2023 166    Ferritin 07/27/2023 25    Sodium 07/27/2023 139    Potassium 07/27/2023 4.2    Chloride 07/27/2023 104    CO2 07/27/2023 30    Glucose, Bld 07/27/2023 100 (H)    BUN 07/27/2023 18    Creatinine, Ser 07/27/2023 1.19    Calcium  07/27/2023 9.4    Total Protein 07/27/2023 6.7    Albumin 07/27/2023 4.0    AST 07/27/2023 32    ALT 07/27/2023 45 (H)    Alkaline Phosphatase 07/27/2023 85    Total Bilirubin 07/27/2023 0.6    GFR, Estimated 07/27/2023 >60    Anion gap 07/27/2023 5    WBC 07/27/2023 7.4    RBC 07/27/2023 6.20 (H)    Hemoglobin 07/27/2023 18.5 (H)    HCT 07/27/2023 55.0 (H)    MCV 07/27/2023 88.7    MCH 07/27/2023 29.8    MCHC 07/27/2023 33.6    RDW 07/27/2023 13.8    Platelets 07/27/2023 201    nRBC 07/27/2023 0.0    Neutrophils Relative % 07/27/2023 77    Neutro Abs 07/27/2023 5.7    Lymphocytes Relative 07/27/2023 12    Lymphs Abs 07/27/2023 0.9    Monocytes Relative 07/27/2023 8    Monocytes Absolute 07/27/2023 0.6    Eosinophils Relative 07/27/2023 2    Eosinophils Absolute 07/27/2023 0.1    Basophils Relative 07/27/2023 0    Basophils Absolute 07/27/2023 0.0    Immature Granulocytes 07/27/2023 1    Abs Immature Granulocytes 07/27/2023 0.04   Infusion on 07/27/2023  Component Date Value   Iron 07/27/2023 62    TIBC 07/27/2023 405    Saturation Ratios 07/27/2023 15 (L)    UIBC 07/27/2023 343   Office Visit on 06/24/2023  Component Date Value   Sodium 06/24/2023 136    Potassium 06/24/2023 4.5    Chloride 06/24/2023 103    CO2 06/24/2023 23    Glucose, Bld 06/24/2023 235 (H)    BUN 06/24/2023 18    Creatinine, Ser 06/24/2023 1.07    Total Bilirubin 06/24/2023 0.5    Alkaline Phosphatase 06/24/2023 95    AST 06/24/2023 33    ALT 06/24/2023 39    Total Protein 06/24/2023 6.9    Albumin 06/24/2023 4.0    GFR 06/24/2023 77.08    Calcium   06/24/2023 9.4    WBC 06/24/2023 6.6    RBC 06/24/2023 6.14 (H)    Hemoglobin 06/24/2023 18.8 Repeated and verified X2. (HH)    HCT 06/24/2023 56.9 (H)    MCV 06/24/2023 92.7    MCHC 06/24/2023 33.0    RDW 06/24/2023 14.2    Platelets 06/24/2023 210.0    Neutrophils Relative % 06/24/2023 80.7 (H)    Lymphocytes Relative 06/24/2023 9.0 (L)    Monocytes Relative 06/24/2023 8.3    Eosinophils Relative 06/24/2023 1.6    Basophils Relative 06/24/2023 0.4    Neutro Abs 06/24/2023 5.4    Lymphs Abs 06/24/2023 0.6 (L)    Monocytes Absolute 06/24/2023 0.6    Eosinophils Absolute 06/24/2023 0.1    Basophils Absolute 06/24/2023 0.0    TSH 06/24/2023 2.340    Cholesterol 06/24/2023 105    HDL 06/24/2023 37 (L)    Triglycerides 06/24/2023 127    LDL Cholesterol (Calc) 06/24/2023 47    Total CHOL/HDL Ratio 06/24/2023 2.8    Non-HDL Cholesterol (Cal* 06/24/2023 68    Testosterone  06/24/2023 745.16    Hgb A1c MFr Bld 06/24/2023 8.1 (H)   No image results found. DG Chest 1 View Result Date: 12/01/2023 CLINICAL DATA:  Preop chest radiograph.  Left renal mass. EXAM: CHEST  1 VIEW COMPARISON:  Chest radiograph dated 12/28/2021. FINDINGS: No focal consolidation, pleural effusion, pneumothorax. There are bibasilar linear atelectasis/scarring. Mild cardiomegaly. No acute osseous pathology. IMPRESSION: 1. No active disease. 2. Mild cardiomegaly. Electronically Signed   By: Angus Bark M.D.   On: 12/01/2023 11:22   IR Radiologist Eval & Mgmt Result Date: 10/18/2023 CLINICAL DATA:  IR clinic consult. EXAM: IR EVAL AND MANAGEMENT COMPARISON:  None Available. FINDINGS: See dictated note in Epic. IMPRESSION: See dictated note in Epic. Electronically Signed   By: Erica Hau M.D.   On: 10/18/2023 15:38      Assessment & Plan Hypogonadism in male He receives testosterone  cipionate 200 mg every 14 days, understanding the risks of increased hemoglobin and potential exacerbation of sleep apnea. He  prioritizes emotional well-being and declined current testosterone  level testing, planning to check levels in six months. Continue testosterone  cipionate 200 mg every 14 days and check levels in six months, ensuring a trough level by scheduling 14 days post-injection. Although I make every effort to build our relationship and provide more comprehensive primary care, this patient has been through a lot of medical trauma and today only wants testosterone  replacement therapy- reports this is critical to maintaining his mood and emotional wellbeing. Renal mass The renal tumor extends into the kidney, causing fatigue. He postponed cryoablation twice due to logistical issues but is now ready to proceed. Cryoablation is scheduled for May 6th at [facility]. Secondary polycythemia He has chronic polycythemia with high hemoglobin levels. Aspirin  325 mg is used to reduce stroke risk, which he understands and accepts. Continue aspirin  325 mg. Likely underlying obstructive sleep apnea but he strongly defers/declines recommendations today, and has full decision making capacity recommendations to get sleep evaluated by any means. Encouraged home sleep test and apple watch and encouraged that he won't necessarily need CPAP- told about inspire Suspected sleep apnea He declined further testing and treatment options, including CPAP, surgery, and the Inspire device. He understands the risks of untreated sleep apnea, such as dementia and stroke. Degeneration of intervertebral disc of lumbar region with discogenic back pain Limited mobility exacerbates his back pain and hinders exercise, leading to weight gain. He declined weight  loss medication and physical therapy, aware of the challenges in managing weight. Amputation stump pain (HCC) Persistent pain and inflammation at the amputation site affect prosthetic use. He is frustrated with the lack of improvement and prosthetic adjustments, as current management is  ineffective. Stage IV pressure ulcer of sacral region (HCC) A stage IV pressure ulcer in the sacral region is now closed but remains painful. He uses a pillow for comfort and reports significant discomfort.       Orders Placed During this Encounter:   Meds ordered this encounter  Medications   testosterone  cypionate (DEPO-TESTOSTERONE ) 200 MG/ML injection    Sig: Inject 1 mL (200 mg total) into the muscle every 14 (fourteen) days. Significant Risks of heart attack, stroke with continued use. Reducing dose as tolerated highly recommended    Dispense:  10 mL    Refill:  0    Please dispense as 10 mL bottle              Additional Info: This encounter employed state-of-the-art, real-time, collaborative documentation. The patient actively reviewed and assisted in updating their electronic medical record on a shared screen, ensuring transparency and facilitating joint problem-solving for the problem list, overview, and plan. This approach promotes accurate, informed care. The treatment plan was discussed and reviewed in detail, including medication safety, potential side effects, and all patient questions. We confirmed understanding and comfort with the plan. Follow-up instructions were established, including contacting the office for any concerns, returning if symptoms worsen, persist, or new symptoms develop, and precautions for potential emergency department visits.   This document was synthesized by artificial intelligence (Abridge) using HIPAA-compliant recording of the clinical interaction;   We discussed the use of AI scribe software for clinical note transcription with the patient, who gave verbal consent to proceed.

## 2023-12-30 ENCOUNTER — Telehealth: Payer: Self-pay | Admitting: Pharmacy Technician

## 2023-12-30 ENCOUNTER — Other Ambulatory Visit (HOSPITAL_COMMUNITY): Payer: Self-pay

## 2023-12-30 NOTE — Telephone Encounter (Signed)
 Pharmacy Patient Advocate Encounter   Received notification from Onbase that prior authorization for Testosterone  Cypionate 200MG /ML intramuscular solution is required/requested.   Insurance verification completed.   The patient is insured through [employer] .   Per test claim: PA required; PA submitted to above mentioned insurance via CoverMyMeds Key/confirmation #/EOC [id number] Status is pending

## 2024-01-11 ENCOUNTER — Other Ambulatory Visit: Payer: Self-pay | Admitting: Radiology

## 2024-01-11 ENCOUNTER — Other Ambulatory Visit (HOSPITAL_COMMUNITY): Payer: Self-pay

## 2024-01-11 DIAGNOSIS — I444 Left anterior fascicular block: Secondary | ICD-10-CM | POA: Diagnosis not present

## 2024-01-11 DIAGNOSIS — N2889 Other specified disorders of kidney and ureter: Secondary | ICD-10-CM

## 2024-01-11 DIAGNOSIS — Z89511 Acquired absence of right leg below knee: Secondary | ICD-10-CM | POA: Diagnosis not present

## 2024-01-11 DIAGNOSIS — E1122 Type 2 diabetes mellitus with diabetic chronic kidney disease: Secondary | ICD-10-CM | POA: Diagnosis not present

## 2024-01-11 DIAGNOSIS — Z79899 Other long term (current) drug therapy: Secondary | ICD-10-CM | POA: Diagnosis not present

## 2024-01-11 DIAGNOSIS — Z89512 Acquired absence of left leg below knee: Secondary | ICD-10-CM | POA: Diagnosis not present

## 2024-01-11 DIAGNOSIS — Z794 Long term (current) use of insulin: Secondary | ICD-10-CM | POA: Diagnosis not present

## 2024-01-11 NOTE — Telephone Encounter (Signed)
 Patient no longer covered by plan. Encounter will be closed.

## 2024-01-12 ENCOUNTER — Other Ambulatory Visit: Payer: Self-pay | Admitting: Radiology

## 2024-01-12 MED ORDER — CEPHALEXIN 500 MG PO CAPS: 500.0000 mg | ORAL_CAPSULE | Freq: Two times a day (BID) | ORAL | 0 refills | Status: AC

## 2024-01-23 ENCOUNTER — Other Ambulatory Visit: Payer: Self-pay | Admitting: Internal Medicine

## 2024-01-23 DIAGNOSIS — T8789 Other complications of amputation stump: Secondary | ICD-10-CM

## 2024-01-23 DIAGNOSIS — M19019 Primary osteoarthritis, unspecified shoulder: Secondary | ICD-10-CM

## 2024-01-25 DIAGNOSIS — Z79899 Other long term (current) drug therapy: Secondary | ICD-10-CM | POA: Diagnosis not present

## 2024-01-26 DIAGNOSIS — I509 Heart failure, unspecified: Secondary | ICD-10-CM | POA: Diagnosis not present

## 2024-01-26 DIAGNOSIS — I130 Hypertensive heart and chronic kidney disease with heart failure and stage 1 through stage 4 chronic kidney disease, or unspecified chronic kidney disease: Secondary | ICD-10-CM | POA: Diagnosis not present

## 2024-01-26 DIAGNOSIS — E785 Hyperlipidemia, unspecified: Secondary | ICD-10-CM | POA: Diagnosis not present

## 2024-01-26 DIAGNOSIS — E1159 Type 2 diabetes mellitus with other circulatory complications: Secondary | ICD-10-CM | POA: Diagnosis not present

## 2024-01-26 DIAGNOSIS — Z008 Encounter for other general examination: Secondary | ICD-10-CM | POA: Diagnosis not present

## 2024-01-26 DIAGNOSIS — E1122 Type 2 diabetes mellitus with diabetic chronic kidney disease: Secondary | ICD-10-CM | POA: Diagnosis not present

## 2024-01-26 DIAGNOSIS — Z6828 Body mass index (BMI) 28.0-28.9, adult: Secondary | ICD-10-CM | POA: Diagnosis not present

## 2024-01-26 DIAGNOSIS — N401 Enlarged prostate with lower urinary tract symptoms: Secondary | ICD-10-CM | POA: Diagnosis not present

## 2024-01-26 DIAGNOSIS — E1169 Type 2 diabetes mellitus with other specified complication: Secondary | ICD-10-CM | POA: Diagnosis not present

## 2024-01-26 DIAGNOSIS — F17220 Nicotine dependence, chewing tobacco, uncomplicated: Secondary | ICD-10-CM | POA: Diagnosis not present

## 2024-01-26 DIAGNOSIS — N182 Chronic kidney disease, stage 2 (mild): Secondary | ICD-10-CM | POA: Diagnosis not present

## 2024-01-26 DIAGNOSIS — E663 Overweight: Secondary | ICD-10-CM | POA: Diagnosis not present

## 2024-01-26 DIAGNOSIS — Z794 Long term (current) use of insulin: Secondary | ICD-10-CM | POA: Diagnosis not present

## 2024-01-26 DIAGNOSIS — F332 Major depressive disorder, recurrent severe without psychotic features: Secondary | ICD-10-CM | POA: Diagnosis not present

## 2024-01-28 ENCOUNTER — Other Ambulatory Visit: Payer: Self-pay | Admitting: Interventional Radiology

## 2024-01-28 DIAGNOSIS — N2889 Other specified disorders of kidney and ureter: Secondary | ICD-10-CM

## 2024-02-01 ENCOUNTER — Telehealth: Payer: Self-pay

## 2024-02-01 NOTE — Telephone Encounter (Signed)
 Copied from CRM [id number]. Topic: Referral - Question >> Feb 01, 2024  8:58 AM Dorthula Gavel H wrote: Reason for CRM: Pt is requesting a referral for hernia surgery. He does not want it to be with any [facility] facility, and is requesting it to go to [facility].  Please review and advise

## 2024-02-16 ENCOUNTER — Encounter: Payer: Self-pay | Admitting: Internal Medicine

## 2024-02-16 ENCOUNTER — Ambulatory Visit (INDEPENDENT_AMBULATORY_CARE_PROVIDER_SITE_OTHER): Admitting: Internal Medicine

## 2024-02-16 VITALS — BP 150/88 | HR 78 | Temp 98.0°F | Ht 75.0 in

## 2024-02-16 DIAGNOSIS — Z872 Personal history of diseases of the skin and subcutaneous tissue: Secondary | ICD-10-CM | POA: Diagnosis not present

## 2024-02-16 DIAGNOSIS — I5032 Chronic diastolic (congestive) heart failure: Secondary | ICD-10-CM | POA: Diagnosis not present

## 2024-02-16 DIAGNOSIS — Z89511 Acquired absence of right leg below knee: Secondary | ICD-10-CM | POA: Diagnosis not present

## 2024-02-16 DIAGNOSIS — G5771 Causalgia of right lower limb: Secondary | ICD-10-CM

## 2024-02-16 DIAGNOSIS — M5136 Other intervertebral disc degeneration, lumbar region with discogenic back pain only: Secondary | ICD-10-CM

## 2024-02-16 DIAGNOSIS — K429 Umbilical hernia without obstruction or gangrene: Secondary | ICD-10-CM | POA: Diagnosis not present

## 2024-02-16 DIAGNOSIS — Z87891 Personal history of nicotine dependence: Secondary | ICD-10-CM

## 2024-02-16 DIAGNOSIS — D751 Secondary polycythemia: Secondary | ICD-10-CM | POA: Diagnosis not present

## 2024-02-16 DIAGNOSIS — E1042 Type 1 diabetes mellitus with diabetic polyneuropathy: Secondary | ICD-10-CM | POA: Diagnosis not present

## 2024-02-16 DIAGNOSIS — Z89512 Acquired absence of left leg below knee: Secondary | ICD-10-CM

## 2024-02-16 DIAGNOSIS — K4090 Unilateral inguinal hernia, without obstruction or gangrene, not specified as recurrent: Secondary | ICD-10-CM | POA: Diagnosis not present

## 2024-02-16 DIAGNOSIS — M79609 Pain in unspecified limb: Secondary | ICD-10-CM | POA: Diagnosis not present

## 2024-02-16 MED ORDER — LIDOCAINE 5 % EX PTCH: 1.0000 | MEDICATED_PATCH | CUTANEOUS | 0 refills | Status: DC

## 2024-02-16 MED ORDER — CELECOXIB 200 MG PO CAPS: 200.0000 mg | ORAL_CAPSULE | Freq: Two times a day (BID) | ORAL | 3 refills | Status: DC

## 2024-02-16 MED ORDER — LIDOCAINE 5 % EX OINT: 1.0000 | TOPICAL_OINTMENT | CUTANEOUS | 0 refills | Status: DC | PRN

## 2024-02-16 NOTE — Patient Instructions (Addendum)
 YOUR VISIT SUMMARY  Visit Date: February 16, 2024  This summary explains your conditions, treatments, and next steps in detail. ?? WHAT WE DISCUSSED    We talked about your worsening hernia symptoms, chronic pain in your right leg stump, back pain, and other health issues. We also discussed your concerns about past healthcare experiences and planned your upcoming treatments.   ?? PRIORITY #1: YOUR UMBILICAL HERNIA (MOST URGENT)  WHAT IS IT: An umbilical hernia happens when part of your intestine pushes through a weak spot in your abdominal muscles near your belly button. Your hernia has gotten much larger and is now causing you significant pain and making it hard for you to stand or do daily activities. ?? WHAT WE'RE DOING: Urgent surgery referral to [facility] (your preferred hospital) We'll help coordinate the surgery scheduling Your blood sugar needs to be better controlled before surgery Special planning needed because of your leg amputations ?? WHAT YOU NEED TO DO: Avoid lifting heavy objects - this could make the hernia worse Watch your diet to help control blood sugar for surgery Follow up with the surgeon when they call Don't strain when moving from your wheelchair   ?? GO TO THE EMERGENCY ROOM IMMEDIATELY IF YOU HAVE: Severe abdominal pain that gets much worse suddenly Nausea and vomiting Fever The hernia becomes hard and you can't push it back in Your belly becomes very swollen These could be signs that your hernia is strangulated - this is a medical emergency!       ?? 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 Your right leg stump shows all the classic signs of this condition. ?? WHY WE THINK YOU HAVE CRPS TYPE II: Nerve damage from amputation surgery - this is what triggers CRPS Type II Burning, severe pain  that's worse than expected from the surgery Red, swollen skin that's very sensitive to touch Your MRI shows inflammation but no infection or bone problems Pain hasn't responded to usual treatments like nerve blocks You can't wear your prosthetic because of the pain and swelling ?? YOUR NEW TREATMENT PLAN: Lidocaine  patches - put one on your stump daily, remove after 12 hours Lidocaine  cream - apply to the painful area as needed Continue gabapentin  for nerve pain Pain specialist referral to Dr. Rexanne Catalina for advanced treatments May discuss ketamine  therapy - a specialized treatment for CRPS  IMPORTANT TO UNDERSTAND: CRPS is usually a chronic (long-term) condition that can't be completely cured, but we can often make it much better with the right treatments. The goal is to reduce your pain enough so you can use a prosthetic leg again and improve your quality of life.   ?? YOUR OTHER CONDITIONS WE'RE MANAGING  ?? CHRONIC BACK PAIN: Your lower back pain is getting worse, partly because you're in a wheelchair more and your body mechanics have changed since your amputations. We're referring you to Physical Medicine & Rehabilitation specialists who can help with your back pain and overall mobility. ?? HIGH RED BLOOD CELL COUNT: Your blood tests show your red blood cells are too high (probably from testosterone  injections). This can increase your risk of blood clots, especially during surgery. You may need to see a blood specialist (hematologist). ?? DIABETES MANAGEMENT: Your A1c is 8.1%, which is higher than the 7% we need for safe surgery.  We need to work on getting your blood sugar better controlled before your hernia surgery. Continue your insulin  and watch your diet carefully. ?? HEART CONDITION: Your heart failure is stable on your current medications (furosemide  and lisinopril ). We'll monitor this closely during your surgery planning.     ?? YOUR NEW MEDICATIONS - IMPORTANT INSTRUCTIONS  LIDOCAINE   PATCHES (5%): How to use:  Put ONE patch on your right leg stump daily  Leave on for up to 12 hours, then remove  Don't leave on longer than 12 hours  You can cut the patch to fit if needed  Don't use on broken skin LIDOCAINE  CREAM (5%): How to use:  Apply a thin layer to painful areas  Use as needed for pain  Wash hands after applying  Don't use on large areas of skin  Don't cover with tight bandages CELECOXIB  (200mg ): How to use:  Take 1 capsule twice daily  Take with food to protect your stomach  This helps reduce inflammation  Tell us  if you have stomach pain  Don't take with other NSAIDs     ?? YOUR FOLLOW-UP APPOINTMENTS  ?? URGENT APPOINTMENTS: General Surgery at [facility] For your hernia repair - they will call you to schedule Pain Specialist (Dr. Rexanne Catalina) For advanced CRPS treatment options including ketamine  therapy ?? OTHER APPOINTMENTS: [facility] Rehabilitation For your back pain and mobility issues Follow-up with us  In 2-4 weeks to check on your progress     ?? IMPORTANT THINGS TO WATCH FOR  ?? CALL 911 OR GO TO ER FOR: Sudden severe abdominal pain Persistent vomiting Signs your hernia is strangulated (see red box above) Chest pain or trouble breathing Signs of blood clots (leg swelling, chest pain) ?? CALL OUR OFFICE FOR: Worsening pain in your leg stump New skin breakdown or pressure sores Problems with your new medications Blood sugar consistently over 300 Any concerns about your conditions     ?? WHAT YOU CAN DO AT HOME  FOR YOUR HERNIA: Avoid heavy lifting (nothing over 10 pounds) Don't strain when moving or transferring Eat smaller, more frequent meals Keep your blood sugar as controlled as possible FOR YOUR DIABETES: Continue checking blood sugars as usual Take insulin  as prescribed Focus on healthy eating to get ready for surgery FOR YOUR STUMP PAIN: Use your new lidocaine  patches and cream as directed Continue gentle massage  if it helps Keep the stump clean and dry Don't try to force wearing a prosthetic if it hurts GENERAL CARE: Check your skin daily for pressure sores Change positions frequently in your wheelchair Stay hydrated and eat well Take medications as prescribed     ?? CONTACT INFORMATION  FOR ROUTINE QUESTIONS: Call our office during business hours Phone: [phone] Hours: Monday-Friday, 8 AM - 5 PM AFTER HOURS: Call our main number and speak to the on-call provider for urgent medical concerns FOR EMERGENCIES: Call 911 or go to the nearest Emergency Room PATIENT PORTAL: Use MyChart for non-urgent questions, medication refills, and appointment requests  Remember: [name] here to help you through this challenging time. Don't hesitate to reach out with questions or concerns.

## 2024-02-16 NOTE — Progress Notes (Signed)
 ==============================  [facility]: [phone]   -- Medical Office Visit --  Patient: [name]      Age: 58 y.o.       Sex:  male  Date:   02/16/2024 Today's Healthcare Provider: Anthon Kins, MD  ==============================    HISTORY OF PRESENT ILLNESS   CHIEF COMPLAINT  58 year old male with bilateral below-knee amputations presents with worsening umbilical hernia symptoms and chronic amputation stump pain.   Discussed the use of AI scribe software for clinical note transcription with the patient, who gave verbal consent to proceed.  History of Present Illness DETAILED HISTORY OF PRESENT ILLNESS  ?? UMBILICAL HERNIA - WORSENING: Patient reports significant worsening of umbilical hernia symptoms with severe functional impairment. States I can't stand and I can't do anything due to pain and mechanical limitations. The hernia has enlarged to the point where he cannot visualize his genitalia. Pain prevents any exercise or normal activities of daily living. HERNIA CHARACTERISTICS: Location: Umbilical region Size: Progressive enlargement causing abdominal distention Pain: Severe, limiting mobility and function No signs of strangulation currently SURGICAL HISTORY: Surgery scheduled October 2023 Postponed due to elevated A1c levels A1c elevation attributed to pre-operative [employer] meal No prior hernia repair procedures   ?? COMPLEX REGIONAL PAIN SYNDROME & AMPUTATION STUMP PAIN: Patient experiences chronic, debilitating pain and swelling in right below-knee amputation stump consistent with Complex Regional Pain Syndrome Type 2. The stump demonstrates persistent erythema, tenderness, and inflammation extending to the knee region. Significant phantom limb pain component present. PAIN CHARACTERISTICS: Chronic burning, aching pain Associated with visible redness and swelling Prevents prosthesis use since  mid-2024 Significant phantom limb pain component TREATMENT RESPONSE: Pregabalin discontinued due to cost Starting gabapentin  for neuropathic pain Pentoxifylline  ineffective for circulation Previous nerve blocks unsuccessful   ?? FUNCTIONAL IMPACT & DISABILITY CONCERNS: As a bilateral below-knee amputee, patient faces significant mobility challenges compounded by current medical issues. Expresses concerns about surgical positioning, pressure sore prevention, and post-operative care logistics. Unable to effectively use quantum catheter; prefers Foley catheter for surgical procedures. MOBILITY CONCERNS: Wheelchair-dependent due to stump pain Cannot wear prosthetics since mid-2024 Fears straining hernia during transfers History of stage IV sacral pressure ulcer HEALTHCARE PREFERENCES: Prefers surgery at [facility] Negative past experiences at [facility] Requests specific surgical considerations Concerns about anesthesia and glucose control   ?? ADDITIONAL SYMPTOMS & CONCERNS: BACK PAIN: Chronic lumbar disc degeneration Worsening pain affecting mobility Exacerbated by current disability status MEDICATION ACCESS: Financial concerns about celecoxib  cost increase Insurance change affecting medication access Exploring alternative medication sources   PERTINENT REVIEW OF SYSTEMS: Patient reports chronic pain as described above. Denies fever, chills, nausea, or vomiting suggestive of hernia complications. No recent changes in bowel or bladder function. Sleep disrupted by chronic pain. Mood affected by chronic pain and functional limitations. No acute respiratory symptoms.   RELEVANT PAST MEDICAL HISTORY  MAJOR SURGICAL HISTORY: Bilateral below-knee amputations (2022) Right leg amputation initially, left leg November 2022 Multiple complications including septic shock, severe sepsis History of gas gangrene and osteomyelitis RELEVANT CHRONIC CONDITIONS: Type 1 Diabetes Mellitus  with polyneuropathy Chronic systolic and diastolic heart failure History of atrial fibrillation/flutter Stage IV pressure ulcer (sacral region)     SOCIAL HISTORY  Tobacco: Never smoker. Former smokeless tobacco user (chew) - quit. Alcohol: Denies current alcohol use. Illicit Drugs: Denies drug use. Functional Status: Wheelchair-dependent due to bilateral amputations and chronic stump pain. Lives independently but requires assistance with some activities of  daily living.      Medication Reconciliation: Current Outpatient Medications on File Prior to Visit  Medication Sig   ACCU-CHEK GUIDE test strip USE UP TO SIX TIMES DAILY TO CHECK BLOOD SUGARS   aspirin  EC 325 MG tablet Take 1 tablet (325 mg total) by mouth daily.   baclofen  (LIORESAL ) 10 MG tablet TAKE 1 TABLET BY MOUTH THREE TIMES DAILY   blood glucose meter kit and supplies KIT Dispense based on patient and insurance preference. Use up to four times daily as directed.   furosemide  (LASIX ) 20 MG tablet TAKE 1 TABLET BY MOUTH ONCE DAILY . APPOINTMENT REQUIRED FOR FUTURE REFILLS   insulin  NPH-regular Human (NOVOLIN  70/30 RELION) (70-30) 100 UNIT/ML injection Inject 15 Units into the skin 2 (two) times daily with a meal. (Patient taking differently: Inject 3-12 Units into the skin 2 (two) times daily with a meal. Sliding scale)   insulin  regular (NOVOLIN  R) 100 units/mL injection    Lancets (ONETOUCH DELICA PLUS LANCET33G) MISC Apply 1 each topically 3 (three) times daily.   lisinopril  (ZESTRIL ) 10 MG tablet TAKE 1 TABLET BY MOUTH ONCE DAILY KEEP  JAN  OFFICE  VISIT   Oxycodone  HCl 10 MG TABS Take 10 mg by mouth 5 (five) times daily.   pentoxifylline  (TRENTAL ) 400 MG CR tablet Take 1 tablet (400 mg total) by mouth 3 (three) times daily with meals.   pregabalin (LYRICA) 75 MG capsule Take 75 mg by mouth 2 (two) times daily.   rosuvastatin  (CRESTOR ) 20 MG tablet Take 1 tablet by mouth once daily   sildenafil  (VIAGRA ) 100 MG tablet Take  100 mg by mouth daily as needed for erectile dysfunction.   tamsulosin  (FLOMAX ) 0.4 MG CAPS capsule Take 0.4 mg by mouth daily.   testosterone  cypionate (DEPO-TESTOSTERONE ) 200 MG/ML injection Inject 1 mL (200 mg total) into the muscle every 14 (fourteen) days. Significant Risks of heart attack, stroke with continued use. Reducing dose as tolerated highly recommended   No current facility-administered medications on file prior to visit.   Medications Discontinued During This Encounter  Medication Reason   celecoxib  (CELEBREX ) 100 MG capsule      Physical Exam:    02/16/2024    4:24 PM 02/16/2024    2:57 PM 12/29/2023    2:14 PM  Vitals with BMI  Height  6' 3   Weight  --   Systolic 150 160 161  Diastolic 88 80 82  Pulse  78 75  Vital signs reviewed.  Nursing notes reviewed. Weight trend reviewed. Physical Exam General Appearance:  No acute distress appreciable.   Well-groomed, healthy-appearing male.  Well proportioned with no abnormal fat distribution.  Good muscle tone. Pulmonary:  Normal work of breathing at rest, no respiratory distress apparent. SpO2: 98 %  Musculoskeletal: Below knee amputation of left lower extremity is noted. Below knee amputation of right lower extremity is noted to also have tenderness and inflammation around the stump up onto knee. Neurological:  Awake, alert, oriented, and engaged.  No obvious focal neurological deficits or cognitive impairments.  Sensorium seems unclouded.   Speech is clear and coherent with logical content. Psychiatric:  Appropriate mood, pleasant and cooperative demeanor, thoughtful and engaged during the exam      Results LABS A1c: elevated (05/2022)  RADIOLOGY CT pelvis: umbilical hernia, inguinal hernia, posterior ascites (2023) MRI tibia: no osteomyelitis, edema, muscle atrophy, chronic inflammation (08/2023)    ASSESSMENT AND PLAN   Assessment & Plan Umbilical hernia without obstruction and without  gangrene ?? UMBILICAL  HERNIA WITHOUT OBSTRUCTION AND WITHOUT GANGRENE (K42.9)  ASSESSMENT: Rapidly progressive umbilical hernia causing severe functional impairment and disability. Patient unable to stand or perform activities of daily living due to pain and mechanical obstruction. Hernia has enlarged significantly since initially scheduled surgery in October 2023, which was postponed due to elevated A1c. No current signs of strangulation, but patient is at high risk for complications given size and symptoms. CT imaging from 2023 demonstrated umbilical hernia; current clinical presentation suggests significant progression. ?? PLAN - URGENT SURGICAL INTERVENTION: Urgent referral to General Surgery at [facility] per patient preference Pre-operative optimization including glycemic control Consider CT scan for surgical planning and hernia mapping Coordinate with patient's preferred facility for procedure ?? PLAN - PATIENT EDUCATION & PRECAUTIONS: Avoid heavy lifting to prevent further enlargement Immediate emergency care for signs of strangulation Warning signs: severe pain, nausea, vomiting Special considerations for bilateral amputee positioning  ?? SURGICAL PLANNING: Patient expresses strong preference for [facility] due to negative past experiences with alternative facilities. Special considerations needed for bilateral below-knee amputee including positioning, pressure relief, and catheter management preferences (Foley preferred over quantum catheter).   Complex regional pain syndrome type 2 of right lower extremity ?? COMPLEX REGIONAL PAIN SYNDROME TYPE 2 OF RIGHT LOWER EXTREMITY (G57.70)  ASSESSMENT: Chronic inflammatory pain syndrome in right below-knee amputation stump characterized by persistent erythema, swelling, allodynia, and functional disability. MRI findings from December 2024 show mild subcutaneous edema and muscular atrophy consistent with chronic inflammatory process without evidence of osteomyelitis  or abscess. Patient unable to tolerate prosthesis for over one year due to pain severity. Failed previous interventions including nerve blocks and pentoxifylline  therapy. ?? PLAN - PHARMACOLOGICAL MANAGEMENT: NEW: Lidocaine  5% patches - apply daily, remove after 12 hours NEW: Lidocaine  5% ointment - apply topically as needed CONTINUE: Gabapentin  for neuropathic pain management Consider celecoxib  200mg  BID for anti-inflammatory effect ?? PLAN - SPECIALIZED CARE: Referral to Pain Specialist (Dr. Rexanne Catalina) for advanced interventions Discuss ketamine  therapy options Consider spinal cord stimulation evaluation Explore sympathetic nerve block procedures  ?? TREATMENT GOALS: Pain reduction to functional level allowing prosthesis use, improved quality of life, and restoration of mobility. CRPS is typically chronic and incurable, requiring multimodal approach for symptom management.   Inguinal hernia without obstruction or gangrene, recurrence not specified, unspecified laterality Care coordination- may be reparable at same time as umbilical hernia. Amputation stump pain (HCC) ? AMPUTATION STUMP PAIN (HCC) (G89.18)  ASSESSMENT: Chronic post-amputation pain in bilateral below-knee amputation sites with predominant involvement of right stump. Pain characterized by burning, aching, and phantom limb sensations preventing prosthetic use and significantly impacting functional status. Recent MRI shows no evidence of infection or osteomyelitis, supporting neuropathic/inflammatory etiology. ?? PLAN - COMPREHENSIVE PAIN MANAGEMENT: Continue current multimodal pain management approach Coordinate care with pain specialist for specialized interventions Physical therapy consultation for desensitization techniques Prosthetic evaluation once pain adequately controlled Consider mirror box therapy for phantom limb pain component Psychological support for chronic pain management     Degeneration of intervertebral disc of  lumbar region with discogenic back pain ?? DEGENERATION OF INTERVERTEBRAL DISC OF LUMBAR REGION WITH DISCOGENIC BACK PAIN (M51.36)  ASSESSMENT: Chronic lumbar disc degeneration with worsening discogenic back pain. Symptoms exacerbated by current mobility limitations, wheelchair dependence, and altered biomechanics secondary to bilateral lower extremity amputations. Pain impacts overall functional status and quality of life. PLAN - COMPREHENSIVE MANAGEMENT: Referral to Physical Medicine & Rehabilitation Comprehensive spine and mobility evaluation Adaptive equipment assessment Wheelchair positioning optimization CONSERVATIVE MANAGEMENT:  Physical therapy for core strengthening Posture and positioning education Heat/cold therapy as tolerated Activity modification counseling     History of decubitus ulcer ??? HISTORY OF STAGE IV PRESSURE ULCER OF SACRAL REGION [phone])  ASSESSMENT: History of severe stage IV sacral pressure ulcer representing significant complication of immobility and amputation status. Patient expresses strong negative feelings toward previous care facility related to this complication. Current risk factors include wheelchair dependence, upcoming surgical procedure, and limited mobility. PLAN - PREVENTION & MONITORING: Maintain vigilant pressure relief measures during all care activities Special attention to positioning during surgical procedures Regular skin assessment and monitoring protocols Optimize nutritional status to support tissue health Coordinate with wound care specialists if any skin breakdown occurs Patient education on pressure relief techniques and self-monitoring   Type 1 diabetes mellitus with diabetic polyneuropathy, with long-term current use of insulin  (HCC)  Chronic diastolic (congestive) heart failure (HCC)   ?? ADDITIONAL ACTIVE CONDITIONS  INGUINAL HERNIA (K40.90): Incidental finding on imaging. Currently asymptomatic but requires monitoring.  Will address after umbilical hernia repair if symptoms develop. TYPE 1 DIABETES WITH COMPLICATIONS: Elevated A1c levels previously delayed surgery. Continue current insulin  regimen with focus on pre-operative optimization. CHRONIC HEART FAILURE: Stable on current medical therapy. Monitor during perioperative period given surgical stress and fluid shifts. SECONDARY POLYCYTHEMIA: Elevated hemoglobin and hematocrit levels. Monitor during surgical procedures for increased thrombotic risk.       ?? GENERAL HEALTH MAINTENANCE  ASSESSMENT: Multiple chronic conditions requiring ongoing management. Recent laboratory studies show well-controlled lipids, stable kidney function, but elevated glucose levels requiring attention for surgical clearance. ? WELL-CONTROLLED: Lipid profile: LDL 47-68 mg/dL (goal <045) Blood pressure: Stable on current medications Kidney function: GFR >60 mL/min No diabetic retinopathy on recent eye exam ?? NEEDS ATTENTION: A1c: 7.1-8.1% (goal <7% for surgery) Glucose control optimization for surgery Weight management with dietary counseling Pain management affecting overall health  PLAN: Dietary counseling for glucose control optimization. Continue current chronic disease management. Coordinate with endocrinology if needed for perioperative glucose management.   ?? MEDICATIONS PRESCRIBED TODAY  LIDOCAINE  5% PATCHES: Apply 1 patch daily to affected stump. Remove after 12 hours. Dispense: 30 patches, No refills. LIDOCAINE  5% OINTMENT: Apply topically to stump as needed for pain. Dispense: 35.44g, No refills. CELECOXIB  200MG : Take 1 capsule twice daily with food. Dispense: 180 capsules, 3 refills.     ?? REFERRALS PLACED TODAY  ?? URGENT - GENERAL SURGERY: Referral for urgent umbilical hernia repair at [facility] per patient preference. Special considerations for bilateral amputee positioning and care needs. ?? PHYSICAL MEDICINE & REHABILITATION: Comprehensive  evaluation for back pain management, mobility optimization, and adaptive equipment assessment.     ?? FOLLOW-UP PLAN  SHORT-TERM (1-2 weeks): General Surgery consultation at [facility] Pain Specialist appointment with Dr. Rexanne Catalina Monitor response to new topical pain medications Pre-operative clearance as needed MEDIUM-TERM (1-3 months): PM&R consultation for back pain and mobility Post-surgical follow-up as scheduled Reassess pain management strategies Consider prosthetic re-evaluation if pain improves  ?? EMERGENCY RETURN PRECAUTIONS: Return immediately for signs of hernia strangulation (severe abdominal pain, nausea, vomiting), worsening stump infection, or new pressure sores. Patient educated on warning signs and emergency contact information provided.     COMPREHENSIVE TIME-BASED BILLING ATTESTATION   ?? VISIT CLASSIFICATION & TIME DOCUMENTATION  Service Date: February 16, 2024 CPT Code: 40981 (Established Patient, Level 5) Total Encounter Time: 43 minutes (Exceeds 40-minute minimum threshold for 99215) Patient: 58 year old male with bilateral below-knee amputations and multiple complex chronic conditions   ?? DETAILED  TIME ALLOCATION & ACTIVITIES PERFORMED  I personally spent a total of 43 minutes in the comprehensive care of this patient on the date of service, including the following physician-level activities: ?? CLINICAL EVALUATION ACTIVITIES: Pre-visit preparation and medical record review Comprehensive history taking for multiple complex conditions Focused physical examination of bilateral amputation sites Assessment of functional status and disability impact Pain assessment and evaluation of treatment response Review of recent imaging studies and laboratory results ?? CARE COORDINATION ACTIVITIES: Complex referral coordination (General Surgery, PM&R) Specialist communication regarding pain management Surgical facility coordination per patient preferences Insurance  authorization discussions and planning Comprehensive care team communication Care transition planning for upcoming procedures   ?? MEDICATION MANAGEMENT: Complex pain medication regimen evaluation New medication prescribing with safety counseling Drug interaction screening and management Cost-related medication access problem-solving Controlled substance management and monitoring ?? PATIENT EDUCATION & COUNSELING: Extensive counseling on hernia progression and risks Complex pain syndrome education and expectations Perioperative care planning and risk discussion Emergency warning signs education Treatment option discussions and shared decision-making  ?? DOCUMENTATION & CLINICAL DECISION MAKING: Comprehensive clinical documentation, complex medical decision-making synthesis, treatment plan development, and care coordination documentation requiring physician-level clinical judgment and expertise.   ?? MEDICAL NECESSITY JUSTIFICATION  The extended duration of this patient encounter (43 minutes) was medically necessary due to the exceptional complexity of clinical presentation and care coordination requirements, specifically: ?? URGENT CLINICAL COMPLEXITY: Rapidly deteriorating umbilical hernia causing severe functional disability and high risk for strangulation Refractory Complex Regional Pain Syndrome preventing prosthetic use and limiting mobility for 18+ months Bilateral below-knee amputation status requiring specialized positioning and care considerations Multiple interacting chronic conditions including brittle diabetes, heart failure, and secondary polycythemia ?? SPECIALIZED CARE COORDINATION: Multi-specialty referral coordination requiring detailed communication regarding patient-specific needs Trauma-informed care approach necessitated by patient's healthcare-related PTSD and facility preferences High-risk surgical planning involving multiple comorbidities and perioperative  optimization Complex pain management strategy requiring integration of multiple therapeutic modalities  ?? COGNITIVE COMPLEXITY FACTORS: High-stakes clinical decision-making regarding surgical timing and safety Complex risk-benefit analysis of multiple treatment options Integration of psychosocial factors affecting treatment compliance and outcomes Perioperative risk stratification for multiple organ systems Medication optimization across multiple therapeutic categories Care continuity planning across multiple specialties and facilities Patient safety protocol development for high-risk interventions Family dynamics and support system assessment impacting care delivery     ?? CLINICAL COMPLEXITY ANALYSIS SUPPORTING EXTENDED TIME  This encounter involved management of multiple high-complexity conditions requiring extensive physician time and cognitive effort: Condition Category Complexity Level Time-Requiring Factors  Umbilical Hernia HIGH ACUITY Urgent referral coordination, surgical risk assessment, patient counseling  Complex Regional Pain Syndrome CHRONIC REFRACTORY-NEW DIAGNOSIS MADE CLINICALLY TODAY Advanced pain specialist coordination, treatment failure analysis, functional assessment  Bilateral Amputations SPECIALIZED NEEDS Adaptive equipment evaluation, positioning protocols, prosthetic coordination  Multiple Comorbidities INTERACTING SYSTEMS Drug interaction screening, perioperative optimization, system-wide risk assessment  Psychosocial Complexity TRAUMA-INFORMED Facility preference coordination, therapeutic relationship maintenance, advocacy     ?? CARE COORDINATION TIME JUSTIFICATION  Significant time was required for care coordination activities that are essential for this patient's safe and effective care: ?? IMMEDIATE COORDINATION NEEDS: General Surgery urgent referral: Detailed communication regarding bilateral amputee surgical considerations Pain specialist coordination:  Treatment failure analysis and advanced intervention planning Facility preference accommodation: [facility] vs. [place] coordination based on patient trauma history Pre-operative optimization: Multiple specialty coordination for high-risk surgery ?? ONGOING COORDINATION REQUIREMENTS: PM&R consultation setup: Comprehensive spine and mobility evaluation coordination Medication access coordination: Insurance authorization and cost-effective alternatives Emergency  care planning: Protocol establishment for hernia complications Family involvement coordination: Caregiver education and support system activation     ?? PHYSICIAN ATTESTATION STATEMENT  PHYSICIAN ATTESTATION: I hereby attest that I personally spent 43 minutes on the date of service (February 16, 2024) providing comprehensive care for this medically complex patient with bilateral below-knee amputations, refractory Complex Regional Pain Syndrome, and rapidly deteriorating umbilical hernia. The extended encounter time was medically necessary due to the exceptional complexity of coordinating urgent surgical intervention while managing multiple interacting chronic conditions in a patient with specialized disability-related needs and healthcare-related trauma. This time included high-level clinical decision-making regarding surgical risk stratification, complex pain management optimization, multi-specialty care coordination, trauma-informed patient counseling, and comprehensive treatment plan development requiring physician-level expertise and clinical judgment. The cognitive complexity of managing this patient's intersecting medical, surgical, psychological, and social needs necessitated the extended time investment to ensure optimal patient safety and care coordination. Electronically signed and authenticated by attending physician Date: February 16, 2024  Time: [Electronic signature timestamp]   PROBLEM OVERVIEW UPDATES - CARE COORDINATION   Updated: February 16, 2024  Complex patient requiring intensive care coordination ?? CRITICAL PRIORITY - IMMEDIATE COORDINATION REQUIRED   UMBILICAL HERNIA WITHOUT OBSTRUCTION (K42.9) - Updated 02/16/2024  CURRENT STATUS: RAPIDLY DETERIORATING - Severe functional disability, unable to stand or perform ADLs ?? URGENT COORDINATION NEEDED: General Surgery at [facility] - URGENT referral placed Pre-operative glucose optimization required (A1c 8.1%) Special positioning needs: bilateral BKA patient Patient preference: Foley catheter vs quantum ?? CRITICAL MONITORING: High risk for strangulation given progression Emergency signs: severe pain, N/V, inability to reduce Previous surgery cancelled 06/2022 due to hyperglycemia Patient [facility] by [facility] [facility]  COORDINATION PRIORITY: Expedite surgical consultation. Coordinate endocrinology for pre-op glucose management. Ensure patient preferences honored regarding facility choice and positioning requirements.   COMPLEX REGIONAL PAIN SYNDROME TYPE 2, RIGHT LOWER EXTREMITY (G57.70) - Updated 02/16/2024  CURRENT STATUS: CHRONIC, REFRACTORY - Preventing prosthetic use for 18+ months, wheelchair dependent ?? TREATMENT ESCALATION: NEW: Lidocaine  patches/cream prescribed today CONTINUING: Gabapentin  (switched from pregabalin - cost issue) URGENT: Pain specialist referral to Dr. Rexanne Catalina CONSIDER: Ketamine  therapy, SCS evaluation ?? OBJECTIVE DATA: MRI 08/2023: No osteomyelitis, mild edema, muscle atrophy Failed interventions: nerve blocks, pentoxifylline  Visible erythema, swelling, allodynia present Functional impact: Unable to wear prosthesis since mid-2024  COORDINATION FOCUS: CRPS typically incurable - focus on functional improvement. Coordinate between pain management, prosthetics, and physical therapy. Patient has tried multiple interventions; needs advanced pain specialist input for multimodal approach.   ?? HIGH PRIORITY - ACTIVE  MANAGEMENT REQUIRED   AMPUTATION STUMP PAIN (HCC) (G89.18) - Updated 02/16/2024  TRAJECTORY: CHRONIC, LIMITING FUNCTION - Right stump predominantly affected, bilateral BKA 2021-2022 CLINICAL PICTURE: Burning, aching, phantom pain sensations Overlaps with CRPS Type 2 presentation Wheelchair dependent since December 2024 Awaiting new prosthetics per notes TREATMENT COORDINATION: Follows with PM&R for opioid management Baclofen  replaced methocarbamol  for spasms Mirror therapy, desensitization techniques Prosthetic re-evaluation once pain controlled     CHRONIC BACK PAIN / LUMBAR DISC DEGENERATION (M51.36) - Updated 02/16/2024  TRAJECTORY: WORSENING - Exacerbated by wheelchair dependence and altered biomechanics COORDINATION STATUS: PM&R referral placed today for comprehensive spine/mobility evaluation. Multiple MRIs show extensive lumbar disc degeneration. Surgery has been considered but patient not currently candidate due to overall condition complexity. PLAN: Coordinate adaptive equipment assessment, wheelchair positioning optimization, and integrated approach with pain management team.   ?? STABLE - ROUTINE MONITORING   TYPE 1 DIABETES MELLITUS WITH POLYNEUROPATHY (HCC) (E10.42) - Stable  CURRENT CONTROL: A1c  7.1-8.1% (goal <7% for surgery). Reclassified from Type 2 to Type 1 (07/2022) due to poor oral agent response and brisk insulin  response. COORDINATION: Pre-operative glucose optimization critical for hernia surgery. May need endocrinology consultation for perioperative management.   CHRONIC SYSTOLIC & DIASTOLIC CHF (HCC) (I50.9) - Stable  CURRENT STATUS: Stable on furosemide  20mg  daily, lisinopril  10mg  daily. Mild cardiomegaly on recent CXR 12/01/2023. PERIOPERATIVE CONSIDERATIONS: Monitor fluid status during surgery. Coordinate with cardiology if needed for high-risk procedures.   SECONDARY POLYCYTHEMIA (D75.1) - Requires Monitoring  CURRENT LABS: Hemoglobin consistently elevated  17.5-19.4 g/dL (normal 91-47). Likely secondary to testosterone  therapy. COORDINATION NEEDED: Hematology referral for evaluation Consider phlebotomy if levels remain elevated Risk-benefit analysis of testosterone  continuation SURGICAL RISK: Increased thrombotic risk perioperatively May need pre-operative phlebotomy Continue daily aspirin  for stroke prevention     ?? HISTORICAL - IMPACTS CURRENT CARE   STAGE IV PRESSURE ULCER OF SACRAL REGION (HCC) (L89.154) - History  STATUS: Previously stage IV sacral pressure ulcer now closed after wound care management. Patient reports strong negative feelings toward [facility] related to this complication. CURRENT RELEVANCE: High risk for recurrence during surgical procedures and hospitalization. Requires meticulous pressure relief and positioning protocols. Influences patient's healthcare facility preferences.   PTSD / GENERALIZED ANXIETY DISORDER (F43.10, F41.1) - Ongoing  IMPACT ON CARE: Affects healthcare interactions and facility preferences. Patient has strong preferences for specific providers and facilities based on past trauma. PHQ-9 scores: 10-16 (moderate to moderately severe depression). COORDINATION: Consider psychological support during major medical interventions. Respect patient autonomy regarding facility choices when medically appropriate.   ?? COMPREHENSIVE CARE COORDINATION SUMMARY   IMMEDIATE PRIORITIES (Next 1-2 weeks): General Surgery consultation ([facility] - URGENT) Pain Management (Dr. Rexanne Catalina - ketamine  therapy evaluation) Pre-operative glucose optimization (consider endocrinology) Hematology referral for polycythemia management PM&R consultation for spine/mobility optimization Anesthesia consultation for high-risk surgical planning  ONGOING COORDINATION NEEDS: Multi-disciplinary pain management: Integration of medical, interventional, and rehabilitative approaches Prosthetic services: Re-evaluation once pain adequately  controlled Wound care vigilance: Prevention protocols for pressure ulcer recurrence Perioperative optimization: Glucose control, cardiac stability, thrombotic risk management Patient advocacy: Honor facility preferences and trauma-informed care approaches ?? SPECIAL CONSIDERATIONS: This patient requires trauma-informed care approaches due to significant healthcare-related trauma. Bilateral amputee with complex pain syndromes requiring specialized positioning and equipment. Strong preference for [facility] facilities. Requires careful coordination between multiple specialists.     This document was synthesized by artificial intelligence (Abridge) using HIPAA-compliant recording of the clinical interaction;   We discussed the use of AI scribe software for clinical note transcription with the patient, who gave verbal consent to proceed. additional Info: This encounter employed state-of-the-art, real-time, collaborative documentation. The patient actively reviewed and assisted in updating their electronic medical record on a shared screen, ensuring transparency and facilitating joint problem-solving for the problem list, overview, and plan. This approach promotes accurate, informed care. The treatment plan was discussed and reviewed in detail, including medication safety, potential side effects, and all patient questions. We confirmed understanding and comfort with the plan. Follow-up instructions were established, including contacting the office for any concerns, returning if symptoms worsen, persist, or new symptoms develop, and precautions for potential emergency department visits.

## 2024-02-17 ENCOUNTER — Encounter: Payer: Self-pay | Admitting: Internal Medicine

## 2024-02-17 DIAGNOSIS — K4090 Unilateral inguinal hernia, without obstruction or gangrene, not specified as recurrent: Secondary | ICD-10-CM | POA: Insufficient documentation

## 2024-02-17 DIAGNOSIS — Z872 Personal history of diseases of the skin and subcutaneous tissue: Secondary | ICD-10-CM | POA: Insufficient documentation

## 2024-02-17 DIAGNOSIS — Z87891 Personal history of nicotine dependence: Secondary | ICD-10-CM | POA: Insufficient documentation

## 2024-02-17 NOTE — Assessment & Plan Note (Addendum)
??   DEGENERATION OF INTERVERTEBRAL DISC OF LUMBAR REGION WITH DISCOGENIC BACK PAIN (M51.36)  ASSESSMENT: Chronic lumbar disc degeneration with worsening discogenic back pain. Symptoms exacerbated by current mobility limitations, wheelchair dependence, and altered biomechanics secondary to bilateral lower extremity amputations. Pain impacts overall functional status and quality of life. PLAN - COMPREHENSIVE MANAGEMENT: Referral to Physical Medicine & Rehabilitation Comprehensive spine and mobility evaluation Adaptive equipment assessment Wheelchair positioning optimization CONSERVATIVE MANAGEMENT: Physical therapy for core strengthening Posture and positioning education Heat/cold therapy as tolerated Activity modification counseling

## 2024-02-17 NOTE — Assessment & Plan Note (Addendum)
?   AMPUTATION STUMP PAIN (HCC) (G89.18)  ASSESSMENT: Chronic post-amputation pain in bilateral below-knee amputation sites with predominant involvement of right stump. Pain characterized by burning, aching, and phantom limb sensations preventing prosthetic use and significantly impacting functional status. Recent MRI shows no evidence of infection or osteomyelitis, supporting neuropathic/inflammatory etiology. ?? PLAN - COMPREHENSIVE PAIN MANAGEMENT: Continue current multimodal pain management approach Coordinate care with pain specialist for specialized interventions Physical therapy consultation for desensitization techniques Prosthetic evaluation once pain adequately controlled Consider mirror box therapy for phantom limb pain component Psychological support for chronic pain management

## 2024-02-17 NOTE — Assessment & Plan Note (Addendum)
??   UMBILICAL HERNIA WITHOUT OBSTRUCTION AND WITHOUT GANGRENE (K42.9)  ASSESSMENT: Rapidly progressive umbilical hernia causing severe functional impairment and disability. Patient unable to stand or perform activities of daily living due to pain and mechanical obstruction. Hernia has enlarged significantly since initially scheduled surgery in October 2023, which was postponed due to elevated A1c. No current signs of strangulation, but patient is at high risk for complications given size and symptoms. CT imaging from 2023 demonstrated umbilical hernia; current clinical presentation suggests significant progression. ?? PLAN - URGENT SURGICAL INTERVENTION: Urgent referral to General Surgery at [facility] per patient preference Pre-operative optimization including glycemic control Consider CT scan for surgical planning and hernia mapping Coordinate with patient's preferred facility for procedure ?? PLAN - PATIENT EDUCATION & PRECAUTIONS: Avoid heavy lifting to prevent further enlargement Immediate emergency care for signs of strangulation Warning signs: severe pain, nausea, vomiting Special considerations for bilateral amputee positioning  ?? SURGICAL PLANNING: Patient expresses strong preference for [facility] due to negative past experiences with alternative facilities. Special considerations needed for bilateral below-knee amputee including positioning, pressure relief, and catheter management preferences (Foley preferred over quantum catheter).

## 2024-02-17 NOTE — Assessment & Plan Note (Signed)
 Care coordination- may be reparable at same time as umbilical hernia.

## 2024-02-17 NOTE — Assessment & Plan Note (Addendum)
???   HISTORY OF STAGE IV PRESSURE ULCER OF SACRAL REGION [id number])  ASSESSMENT: History of severe stage IV sacral pressure ulcer representing significant complication of immobility and amputation status. Patient expresses strong negative feelings toward previous care facility related to this complication. Current risk factors include wheelchair dependence, upcoming surgical procedure, and limited mobility. PLAN - PREVENTION & MONITORING: Maintain vigilant pressure relief measures during all care activities Special attention to positioning during surgical procedures Regular skin assessment and monitoring protocols Optimize nutritional status to support tissue health Coordinate with wound care specialists if any skin breakdown occurs Patient education on pressure relief techniques and self-monitoring

## 2024-02-17 NOTE — Assessment & Plan Note (Addendum)
??   COMPLEX REGIONAL PAIN SYNDROME TYPE 2 OF RIGHT LOWER EXTREMITY (G57.70)  ASSESSMENT: Chronic inflammatory pain syndrome in right below-knee amputation stump characterized by persistent erythema, swelling, allodynia, and functional disability. MRI findings from December 2024 show mild subcutaneous edema and muscular atrophy consistent with chronic inflammatory process without evidence of osteomyelitis or abscess. Patient unable to tolerate prosthesis for over one year due to pain severity. Failed previous interventions including nerve blocks and pentoxifylline  therapy. ?? PLAN - PHARMACOLOGICAL MANAGEMENT: NEW: Lidocaine  5% patches - apply daily, remove after 12 hours NEW: Lidocaine  5% ointment - apply topically as needed CONTINUE: Gabapentin  for neuropathic pain management Consider celecoxib  200mg  BID for anti-inflammatory effect ?? PLAN - SPECIALIZED CARE: Referral to Pain Specialist (Dr. Rexanne Catalina) for advanced interventions Discuss ketamine  therapy options Consider spinal cord stimulation evaluation Explore sympathetic nerve block procedures  ?? TREATMENT GOALS: Pain reduction to functional level allowing prosthesis use, improved quality of life, and restoration of mobility. CRPS is typically chronic and incurable, requiring multimodal approach for symptom management.

## 2024-02-18 NOTE — Telephone Encounter (Signed)
 See note

## 2024-02-22 ENCOUNTER — Ambulatory Visit (INDEPENDENT_AMBULATORY_CARE_PROVIDER_SITE_OTHER): Admitting: Orthopedic Surgery

## 2024-02-22 DIAGNOSIS — Z89511 Acquired absence of right leg below knee: Secondary | ICD-10-CM | POA: Diagnosis not present

## 2024-02-22 DIAGNOSIS — Z89512 Acquired absence of left leg below knee: Secondary | ICD-10-CM

## 2024-02-22 DIAGNOSIS — M79661 Pain in right lower leg: Secondary | ICD-10-CM | POA: Diagnosis not present

## 2024-02-23 ENCOUNTER — Telehealth: Payer: Self-pay

## 2024-02-23 NOTE — Telephone Encounter (Signed)
 Copied from CRM [id number]. Topic: General - Other >> Feb 23, 2024 10:49 AM [name] wrote: Reason for CRM: Patient needs a general surgeon for umbilical hernia that's in [place], that is with [facility] doesn't;t want to go to [place] if he can avoid it

## 2024-02-25 ENCOUNTER — Ambulatory Visit
Admission: RE | Admit: 2024-02-25 | Discharge: 2024-02-25 | Disposition: A | Discharge status: Home / Self Care | Source: Ambulatory Visit | Attending: Interventional Radiology | Admitting: Interventional Radiology

## 2024-02-25 DIAGNOSIS — N2889 Other specified disorders of kidney and ureter: Secondary | ICD-10-CM | POA: Diagnosis not present

## 2024-02-25 HISTORY — PX: IR RADIOLOGIST EVAL & MGMT: IMG5224

## 2024-02-25 NOTE — Progress Notes (Signed)
 Chief Complaint: Patient was consulted remotely today (TeleHealth) for follow up after cryoablationof a left renal neoplasm.  History of Present Illness: [name] is a 58 y.o. male status post cryoablation of a 3.1 cm partially solid and partially cystic neoplasm of the left kidney at [facility] on 01/11/2024. At the time of the procedure, biopsy only yielded some fluid from a cystic component, and cytologic analysis of that fluid did not show any malignant cells. He did well after overnight observation and has not had any abdominal or flank pain. He denies urinary symptoms or fever. He is currently trying to establish care with a general surgeon to have an umbilical hernia evaluated and repaired.  Past Medical History:  Diagnosis Date   Acquired complex renal cyst 09/25/2019   Acute respiratory failure with hypoxia (HCC) secondary to suspected aspiration pneumonia 12/15/2021   AKI (acute kidney injury) (HCC) 07/22/2021   Cancer (HCC)    renal   Chest pain 07/22/2021   CHF (congestive heart failure) (HCC)    Complication of anesthesia    last time came out angry   Decubitus ulcer of sacral area 12/15/2021   Depression    Diabetes mellitus without complication (HCC)    Diabetic infection of left foot (HCC) 04/17/2021   Episode of unresponsiveness 12/15/2021   Gas gangrene of foot (HCC) 07/22/2021   Healthcare maintenance 09/25/2019   Hematuria 09/25/2019   History of kidney stones    Hypertension    Lactic acidosis 12/15/2021   Leukocytosis 09/13/2021   Osteomyelitis (HCC)    PTSD (post-traumatic stress disorder)    Sacral pressure injury of skin 07/27/2021   Septic shock (HCC) 07/22/2021   Severe sepsis with acute organ dysfunction (HCC) 07/22/2021   Shortness of breath 05/16/2021   Stage 3a chronic kidney disease (HCC) 09/25/2019   Systolic dysfunction    Type 2 diabetes mellitus 10/12/2019    Past Surgical History:  Procedure Laterality Date    AMPUTATION Left 07/23/2021   Procedure: LEFT BELOW KNEE AMPUTATION;  Surgeon: Timothy Ford, MD;  Location: [facility] OR;  Service: Orthopedics;  Laterality: Left;   BELOW KNEE LEG AMPUTATION Right    BUBBLE STUDY  07/29/2021   Procedure: BUBBLE STUDY;  Surgeon: Jacqueline Matsu, MD;  Location: [facility] ENDOSCOPY;  Service: Cardiovascular;;   CARDIOVERSION N/A 07/29/2021   Procedure: CARDIOVERSION;  Surgeon: Jacqueline Matsu, MD;  Location: [facility] ENDOSCOPY;  Service: Cardiovascular;  Laterality: N/A;   IR RADIOLOGIST EVAL & MGMT  10/18/2023   RADIOLOGY WITH ANESTHESIA N/A 08/05/2021   Procedure: MRI LUMBAR WITH AND WITHOUT; THORASIC SPINE WITH AND WITHOUT WITH ANESTHESIA;  Surgeon: Radiologist, Medication, MD;  Location: [facility] OR;  Service: Radiology;  Laterality: N/A;   RADIOLOGY WITH ANESTHESIA N/A 08/07/2021   Procedure: MRI WITH LUMBER WITH AND WITHOUT CONTRAST,THORACIC WITH AND WITHOUT CONTRAST;  Surgeon: Radiologist, Medication, MD;  Location: [facility] OR;  Service: Radiology;  Laterality: N/A;   RADIOLOGY WITH ANESTHESIA N/A 09/13/2021   Procedure: MRI WITH ANESTHESIA;  Surgeon: Luellen Sages, MD;  Location: [facility] OR;  Service: Radiology;  Laterality: N/A;   TEE WITHOUT CARDIOVERSION N/A 07/29/2021   Procedure: TRANSESOPHAGEAL ECHOCARDIOGRAM (TEE);  Surgeon: Jacqueline Matsu, MD;  Location[facility] ENDOSCOPY;  Service: Cardiovascular;  Laterality: N/A;    Allergies: Bactrim [sulfamethoxazole-trimethoprim], Ciprofloxacin , and Levaquin [levofloxacin]  Medications: Prior to Admission medications   Medication Sig Start Date End Date Taking? Authorizing Provider  ACCU-CHEK GUIDE test strip USE UP TO SIX TIMES DAILY  TO CHECK BLOOD SUGARS 04/26/23   Anthon Kins, MD  aspirin  EC 325 MG tablet Take 1 tablet (325 mg total) by mouth daily. 07/28/23   Anthon Kins, MD  baclofen  (LIORESAL ) 10 MG tablet TAKE 1 TABLET BY MOUTH THREE TIMES DAILY 03/22/23   Anthon Kins, MD  blood glucose meter kit and supplies KIT  Dispense based on patient and insurance preference. Use up to four times daily as directed. 12/05/21   Passmore, Ramona Burner I, NP  celecoxib  (CELEBREX ) 200 MG capsule Take 1 capsule (200 mg total) by mouth 2 (two) times daily. 02/16/24   Anthon Kins, MD  furosemide  (LASIX ) 20 MG tablet TAKE 1 TABLET BY MOUTH ONCE DAILY . APPOINTMENT REQUIRED FOR FUTURE REFILLS 09/28/23   Croitoru, Karyl Paget, MD  insulin  NPH-regular Human (NOVOLIN  70/30 RELION) (70-30) 100 UNIT/ML injection Inject 15 Units into the skin 2 (two) times daily with a meal. Patient taking differently: Inject 3-12 Units into the skin 2 (two) times daily with a meal. Sliding scale 12/18/21   Vada Garibaldi, MD  insulin  regular (NOVOLIN  R) 100 units/mL injection     [provider]  Lancets (ONETOUCH DELICA PLUS LANCET33G) MISC Apply 1 each topically 3 (three) times daily. 01/27/24   [provider]  lidocaine  (LIDODERM ) 5 % Place 1 patch onto the skin daily. Remove & Discard patch within 12 hours or as directed by MD 02/16/24   Anthon Kins, MD  lidocaine  (XYLOCAINE ) 5 % ointment Apply 1 Application topically as needed. 02/16/24   Anthon Kins, MD  lisinopril  (ZESTRIL ) 10 MG tablet TAKE 1 TABLET BY MOUTH ONCE DAILY KEEP  JAN  OFFICE  VISIT 11/08/23   Croitoru, Karyl Paget, MD  Oxycodone  HCl 10 MG TABS Take 10 mg by mouth 5 (five) times daily. 04/03/22   [provider]  pentoxifylline  (TRENTAL ) 400 MG CR tablet Take 1 tablet (400 mg total) by mouth 3 (three) times daily with meals. 10/19/23   Timothy Ford, MD  pregabalin (LYRICA) 75 MG capsule Take 75 mg by mouth 2 (two) times daily.    [provider]  rosuvastatin  (CRESTOR ) 20 MG tablet Take 1 tablet by mouth once daily 10/10/23   Morrison, Ryan G, MD  sildenafil  (VIAGRA ) 100 MG tablet Take 100 mg by mouth daily as needed for erectile dysfunction. 12/15/22   [provider]  tamsulosin  (FLOMAX ) 0.4 MG CAPS capsule Take 0.4 mg by mouth daily. 04/21/22    [provider]  testosterone  cypionate (DEPO-TESTOSTERONE ) 200 MG/ML injection Inject 1 mL (200 mg total) into the muscle every 14 (fourteen) days. Significant Risks of heart attack, stroke with continued use. Reducing dose as tolerated highly recommended 12/29/23   Anthon Kins, MD     Family History  Problem Relation Age of Onset   Anxiety disorder Mother    Cancer Father    Coronary artery disease Father    Sleep apnea Neg Hx     Social History   Socioeconomic History   Marital status: Divorced    Spouse name: Not on file   Number of children: Not on file   Years of education: Not on file   Highest education level: Bachelor's degree (e.g., BA, AB, BS)  Occupational History   Not on file  Tobacco Use   Smoking status: Never   Smokeless tobacco: Former    Types: Engineer, drilling   Vaping status: Never Used  Substance and Sexual Activity   Alcohol use:  Not Currently   Drug use: Never   Sexual activity: Not Currently  Other Topics Concern   Not on file  Social History Narrative   Pt lives alone   Pt not working    Social Drivers of Corporate investment banker Strain: Low Risk  (12/26/2023)   Overall Financial Resource Strain (CARDIA)    Difficulty of Paying Living Expenses: Not hard at all  Food Insecurity: Unknown (12/26/2023)   Hunger Vital Sign    Worried About Running Out of Food in the Last Year: Never true    Ran Out of Food in the Last Year: Patient declined  Transportation Needs: No Transportation Needs (12/26/2023)   PRAPARE - Administrator, Civil Service (Medical): No    Lack of Transportation (Non-Medical): No  Physical Activity: Inactive (12/26/2023)   Exercise Vital Sign    Days of Exercise per Week: 0 days    Minutes of Exercise per Session: 0 min  Stress: Stress Concern Present (12/26/2023)   [employer] of Occupational Health - Occupational Stress Questionnaire    Feeling of Stress : Very much  Social Connections:  Socially Isolated (12/26/2023)   Social Connection and Isolation Panel    Frequency of Communication with Friends and Family: Once a week    Frequency of Social Gatherings with Friends and Family: Never    Attends Religious Services: Never    Database administrator or Organizations: No    Attends Engineer, structural: Never    Marital Status: Divorced    ECOG Status: 0 - Asymptomatic  Review of Systems  Constitutional: Negative.   Respiratory: Negative.    Cardiovascular: Negative.   Gastrointestinal: Negative.   Genitourinary: Negative.   Musculoskeletal: Negative.   Neurological: Negative.     Review of Systems: A 12 point ROS discussed and pertinent positives are indicated in the HPI above.  All other systems are negative.    Physical Exam No direct physical exam was performed (except for noted visual exam findings with Video Visits).    Imaging: No results found.  Labs:  CBC: Recent Labs    06/24/23 1411 07/27/23 1412 10/26/23 1455 12/01/23 0708  WBC 6.6 7.4 6.4 7.4  HGB 18.8 Repeated and verified X2.* 18.5* 17.9* 17.5*  HCT 56.9* 55.0* 51.5 53.1*  PLT 210.0 201 191 202    COAGS: Recent Labs    12/01/23 0708  INR 1.0    BMP: Recent Labs    06/24/23 1411 07/27/23 1412 10/26/23 1455 12/01/23 0708  NA 136 139 140 140  K 4.5 4.2 3.8 3.7  CL 103 104 106 107  CO2 23 30 29 23   GLUCOSE 235* 100* 94 82  BUN 18 18 18 19   CALCIUM  9.4 9.4 9.0 8.9  CREATININE 1.07 1.19 1.30* 1.33*  GFRNONAA  --  >60 >60 >60    LIVER FUNCTION TESTS: Recent Labs    06/24/23 1411 07/27/23 1412 10/26/23 1455  BILITOT 0.5 0.6 0.5  AST 33 32 24  ALT 39 45* 33  ALKPHOS 95 85 92  PROT 6.9 6.7 6.6  ALBUMIN 4.0 4.0 3.7    Assessment and Plan:  Mr [name] is doing well after cryoablation of the partially cystic and solid left renal mass. I recommended a follow up multiphase CT with and without contrast 3 months after ablation in early August. I will meet  with him to review findings after the CT study.   Electronically Signed: Aileen Alexanders 02/25/2024, 3:15  PM    I spent a total of  10 Minutes in remote  clinical consultation, greater than 50% of which was counseling/coordinating care post cryoablation of a left renal mass.    Visit type: Audio only (telephone). Audio (no video) only due to patient's lack of internet/smartphone capability. Alternative for in-person consultation at [facility], [address] [place], [place], [place]. This visit type was conducted due to national recommendations for restrictions regarding the COVID-19 Pandemic (e.g. social distancing).  This format is felt to be most appropriate for this patient at this time.  All issues noted in this document were discussed and addressed.

## 2024-02-29 ENCOUNTER — Encounter: Payer: Self-pay | Admitting: Orthopedic Surgery

## 2024-02-29 NOTE — Progress Notes (Signed)
 Office Visit Note   Patient: [name]           Date of Birth: 1966/09/05           MRN: [id number] Visit Date: 02/22/2024              Requested by: Jesus Bernardino MATSU, MD [address] [place],  [place] [address] PCP: Jesus Bernardino MATSU, MD  Chief Complaint  Patient presents with   Right Leg - Follow-up    Hx bka      HPI: Patient is a 58 year old gentleman who is seen in follow-up for right lower extremity.  Patient was recently started on Neurontin  for complex regional pain syndrome by his primary care physician.  Assessment & Plan: Visit Diagnoses:  1. S/P bilateral below knee amputation (HCC)   2. Pain in right lower leg     Plan: Patient will continue with his Neurontin .  His symptoms continue to improve with improved color and temperature of the leg.  Follow-Up Instructions: Return in about 4 weeks (around 03/21/2024).   Ortho Exam  Patient is alert, oriented, no adenopathy, well-dressed, normal affect, normal respiratory effort. Examination of the color in the residual limb right leg is returning to normal the swelling is decreasing.  He has normal temperature there is no sweating.  There is noes hypersensitivity to light touch.  Clinically no signs of complex regional pain syndrome.    Imaging: No results found. No images are attached to the encounter.  Labs: Lab Results  Component Value Date   HGBA1C 8.1 (H) 06/24/2023   HGBA1C 7.1 (H) 12/23/2022   HGBA1C 11.9 (H) 06/15/2022   ESRSEDRATE 25 (H) 07/03/2022   ESRSEDRATE 11 12/30/2021   ESRSEDRATE 12 12/29/2021   CRP <1.0 07/03/2022   CRP 0.7 12/30/2021   CRP 1.1 (H) 12/29/2021   REPTSTATUS 04/11/2022 FINAL 04/09/2022   CULT >=100,000 COLONIES/mL PSEUDOMONAS AERUGINOSA (A) 04/09/2022   LABORGA PSEUDOMONAS AERUGINOSA (A) 04/09/2022     Lab Results  Component Value Date   ALBUMIN 3.7 10/26/2023   ALBUMIN 4.0 07/27/2023   ALBUMIN 4.0 06/24/2023    Lab Results  Component Value Date   MG  1.7 12/18/2021   MG 1.4 (L) 12/16/2021   MG 1.5 (L) 08/15/2021   No results found for: VD25OH  No results found for: PREALBUMIN    Latest Ref Rng & Units 12/01/2023    7:08 AM 10/26/2023    2:55 PM 07/27/2023    2:12 PM  CBC EXTENDED  WBC 4.0 - 10.5 K/uL 7.4  6.4  7.4   RBC 4.22 - 5.81 MIL/uL 5.69  5.76  6.20   Hemoglobin 13.0 - 17.0 g/dL 82.4  82.0  81.4   HCT 39.0 - 52.0 % 53.1  51.5  55.0   Platelets 150 - 400 K/uL 202  191  201   NEUT# 1.7 - 7.7 K/uL 5.7  5.2  5.7   Lymph# 0.7 - 4.0 K/uL 0.8  0.5  0.9      There is no height or weight on file to calculate BMI.  Orders:  No orders of the defined types were placed in this encounter.  No orders of the defined types were placed in this encounter.    Procedures: No procedures performed  Clinical Data: No additional findings.  ROS:  All other systems negative, except as noted in the HPI. Review of Systems  Objective: Vital Signs: There were no vitals taken for this visit.  Specialty Comments:  No specialty comments available.  PMFS History: Patient Active Problem List   Diagnosis Date Noted   Inguinal hernia without obstruction or gangrene 02/17/2024   History of decubitus ulcer 02/17/2024   History of tobacco use 02/17/2024   Complex regional pain syndrome type 2 of right lower extremity 02/16/2024   Umbilical hernia without obstruction and without gangrene 02/16/2024   Adverse reaction to intramuscular testosterone  08/11/2023   History of atrial fibrillation 08/11/2023   Shoulder arthritis 07/28/2023   Amputation stump pain (HCC) 07/28/2023   Chronic back pain 12/24/2022   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HCC) 07/24/2022   Malaise 06/22/2022   Secondary polycythemia 06/22/2022   Amputation below knee (HCC) 04/23/2022   PTSD (post-traumatic stress disorder) 04/23/2022   Generalized anxiety disorder 01/21/2022   Stage 4 skin ulcer of sacral region (HCC) 12/29/2021   Episode of unresponsiveness 12/15/2021    Renal mass 12/15/2021   Stage IV pressure ulcer of sacral region (HCC) 09/15/2021   Insomnia 09/14/2021   Chronic systolic CHF (congestive heart failure) (HCC) 09/14/2021   Hypogonadism in male 09/14/2021   Chronic pain 09/14/2021   Pseudohyponatremia 09/13/2021   Normocytic anemia 09/13/2021   Discitis of thoracic region    Paroxysmal atrial flutter (HCC) 07/22/2021   Chronic diastolic (congestive) heart failure (HCC) 05/16/2021   Lumbar spondylosis 03/25/2021   H/O amputation of leg through tibia and fibula (HCC) 11/10/2019   Degeneration of lumbar intervertebral disc 11/10/2019   Hypercholesterolemia 11/10/2019   Status post bilateral below knee amputation (HCC) 10/30/2019   Dyslipidemia 10/30/2019   Type 1 diabetes mellitus with diabetic polyneuropathy, with long-term current use of insulin  (HCC) 10/12/2019   Hypertensive disorder 09/25/2019   Urinary hesitancy 09/25/2019   Prosthesis adjustments 09/25/2019   Erectile dysfunction 09/25/2019   Palliative care status 09/25/2019   Pulmonary nodules 09/25/2019   Depressive disorder 09/25/2019   Past Medical History:  Diagnosis Date   Acquired complex renal cyst 09/25/2019   Acute respiratory failure with hypoxia (HCC) secondary to suspected aspiration pneumonia 12/15/2021   AKI (acute kidney injury) (HCC) 07/22/2021   Cancer (HCC)    renal   Chest pain 07/22/2021   CHF (congestive heart failure) (HCC)    Complication of anesthesia    last time came out angry   Decubitus ulcer of sacral area 12/15/2021   Depression    Diabetes mellitus without complication (HCC)    Diabetic infection of left foot (HCC) 04/17/2021   Episode of unresponsiveness 12/15/2021   Gas gangrene of foot (HCC) 07/22/2021   Healthcare maintenance 09/25/2019   Hematuria 09/25/2019   History of kidney stones    Hypertension    Lactic acidosis 12/15/2021   Leukocytosis 09/13/2021   Osteomyelitis (HCC)    PTSD (post-traumatic stress disorder)     Sacral pressure injury of skin 07/27/2021   Septic shock (HCC) 07/22/2021   Severe sepsis with acute organ dysfunction (HCC) 07/22/2021   Shortness of breath 05/16/2021   Stage 3a chronic kidney disease (HCC) 09/25/2019   Systolic dysfunction    Type 2 diabetes mellitus 10/12/2019    Family History  Problem Relation Age of Onset   Anxiety disorder Mother    Cancer Father    Coronary artery disease Father    Sleep apnea Neg Hx     Past Surgical History:  Procedure Laterality Date   AMPUTATION Left 07/23/2021   Procedure: LEFT BELOW KNEE AMPUTATION;  Surgeon: Harden Jerona GAILS, MD;  Location: [name] OR;  Service: Orthopedics;  Laterality: Left;  BELOW KNEE LEG AMPUTATION Right    BUBBLE STUDY  07/29/2021   Procedure: BUBBLE STUDY;  Surgeon: Shlomo Wilbert SAUNDERS, MD;  Location: [facility] ENDOSCOPY;  Service: Cardiovascular;;   CARDIOVERSION N/A 07/29/2021   Procedure: CARDIOVERSION;  Surgeon: Shlomo Wilbert SAUNDERS, MD;  Location: [facility] ENDOSCOPY;  Service: Cardiovascular;  Laterality: N/A;   IR RADIOLOGIST EVAL & MGMT  10/18/2023   IR RADIOLOGIST EVAL & MGMT  02/25/2024   RADIOLOGY WITH ANESTHESIA N/A 08/05/2021   Procedure: MRI LUMBAR WITH AND WITHOUT; THORASIC SPINE WITH AND WITHOUT WITH ANESTHESIA;  Surgeon: Radiologist, Medication, MD;  Location: [facility] OR;  Service: Radiology;  Laterality: N/A;   RADIOLOGY WITH ANESTHESIA N/A 08/07/2021   Procedure: MRI WITH LUMBER WITH AND WITHOUT CONTRAST,THORACIC WITH AND WITHOUT CONTRAST;  Surgeon: Radiologist, Medication, MD;  Location: [facility] OR;  Service: Radiology;  Laterality: N/A;   RADIOLOGY WITH ANESTHESIA N/A 09/13/2021   Procedure: MRI WITH ANESTHESIA;  Surgeon: Dolphus Carrion, MD;  Location: [facility] OR;  Service: Radiology;  Laterality: N/A;   TEE WITHOUT CARDIOVERSION N/A 07/29/2021   Procedure: TRANSESOPHAGEAL ECHOCARDIOGRAM (TEE);  Surgeon: Shlomo Wilbert SAUNDERS, MD;  Location: [facility] ENDOSCOPY;  Service: Cardiovascular;  Laterality: N/A;   Social History   Occupational  History   Not on file  Tobacco Use   Smoking status: Never   Smokeless tobacco: Former    Types: Engineer, drilling   Vaping status: Never Used  Substance and Sexual Activity   Alcohol use: Not Currently   Drug use: Never   Sexual activity: Not Currently

## 2024-03-03 ENCOUNTER — Encounter (HOSPITAL_COMMUNITY): Payer: Self-pay | Admitting: Interventional Radiology

## 2024-03-06 ENCOUNTER — Encounter: Payer: Self-pay | Admitting: Internal Medicine

## 2024-03-06 NOTE — Telephone Encounter (Signed)
 Patient called back and explained that he doesn't want to go to a [facility] for [facility] and asked if the nurse could give him a call to further discuss,

## 2024-03-16 ENCOUNTER — Ambulatory Visit: Payer: Self-pay

## 2024-03-16 NOTE — Telephone Encounter (Unsigned)
 Copied from CRM [id number]. Topic: Referral - Question >> Mar 16, 2024 11:18 AM Suzen RAMAN wrote: Reason for CRM: Patient would like a referral placed for a hernia surgeons that don't operate at [facility]. Pain is currently in Pain and has been waiting on a new referral for almost a month. Please contact patient to further advise CB# [phone]

## 2024-03-16 NOTE — Telephone Encounter (Signed)
 FYI Only or Action Required?: Action required by provider: referral request.  Patient was last seen in primary care on 02/16/2024 by Jesus Bernardino MATSU, MD.  Called Nurse Triage reporting Hernia.  Symptoms began Ongoing .  Interventions attempted: Other: N/A.  Symptoms are: unchanged.  Triage Disposition: Call PCP When Office is Open  Patient/caregiver understands and will follow disposition?: No, wishes to speak with PCP  **See note Below**                  Copied from CRM [id number]. Topic: Clinical - Red Word Triage >> Mar 16, 2024 11:24 AM Suzen RAMAN wrote: Red Word that prompted transfer to Nurse Triage:Hernia Pain.... awaiting a referral for surgeon not affiliated with [facility] Reason for Disposition  [1] Caller requesting NON-URGENT health information AND [2] PCP's office is the best resource  Answer Assessment - Initial Assessment Questions 1. ONSET:  When did this first appear?      2. APPEARANCE: What does it look like?      3. SIZE: How big is it? (e.g., inches, cm; or compare to coins, fruit)      4. LOCATION: Where exactly is the hernia located?      5. PATTERN: Does the swelling come and go, or has it been constant since it started?      6. PAIN: Is there any pain? If Yes, ask: How bad is it?  (Scale 0-10; or none, mild, moderate, severe)      7. DIAGNOSIS: Have you been seen by a doctor (or NP/PA) for this? Did the doctor diagnose you as having a hernia?      8. OTHER SYMPTOMS: Do you have any other symptoms? (e.g., fever, abdomen pain, vomiting)  Answer Assessment - Initial Assessment Questions 1. REASON FOR CALL: What is the main reason for your call? or How can I best help you?   Patient called back and stated he has been waiting on the referral for a month now. He stated [facility] is where he'd like to be referred to, and not a [facility].  Protocols used: Hernia-A-AH, Information Only Call - No  Triage-A-AH

## 2024-03-16 NOTE — Telephone Encounter (Signed)
 Sent a message to [place] our referral team to look into this and the request from the pt

## 2024-03-27 ENCOUNTER — Ambulatory Visit (INDEPENDENT_AMBULATORY_CARE_PROVIDER_SITE_OTHER)

## 2024-03-27 ENCOUNTER — Ambulatory Visit: Admitting: Orthopedic Surgery

## 2024-03-27 VITALS — Wt 225.0 lb

## 2024-03-27 DIAGNOSIS — M79661 Pain in right lower leg: Secondary | ICD-10-CM | POA: Diagnosis not present

## 2024-03-27 DIAGNOSIS — Z89512 Acquired absence of left leg below knee: Secondary | ICD-10-CM

## 2024-03-27 DIAGNOSIS — Z0000 Encounter for general adult medical examination without abnormal findings: Secondary | ICD-10-CM | POA: Diagnosis not present

## 2024-03-27 DIAGNOSIS — Z89511 Acquired absence of right leg below knee: Secondary | ICD-10-CM | POA: Diagnosis not present

## 2024-03-27 NOTE — Patient Instructions (Signed)
 [name] , Thank you for taking time out of your busy schedule to complete your Annual Wellness Visit with me. I enjoyed our conversation and look forward to speaking with you again next year. I, as well as your care team,  appreciate your ongoing commitment to your health goals. Please review the following plan we discussed and let me know if I can assist you in the future. Your Game plan/ To Do List    Referrals: If you haven't heard from the office you've been referred to, please reach out to them at the phone provided.   Follow up Visits: Next Medicare AWV with our clinical staff: 04/02/25 Have you seen your provider in the last 6 months (3 months if uncontrolled diabetes)? No Next Office Visit with your provider: Pt declined to set at this time stated he will follow up in August with PCP   Clinician Recommendations:  Each day, aim for 6 glasses of water, plenty of protein in your diet and try to get up and walk/ stretch every hour for 5-10 minutes at a time.        This is a list of the screening recommended for you and due dates:  Health Maintenance  Topic Date Due   Pneumococcal Vaccination (1 of 2 - PCV) Never done   Hepatitis B Vaccine (1 of 3 - 19+ 3-dose series) Never done   Colon Cancer Screening  Never done   Hemoglobin A1C  12/23/2023   Flu Shot  04/07/2024   Eye exam for diabetics  07/18/2024   DTaP/Tdap/Td vaccine (2 - Td or Tdap) 09/24/2029   Hepatitis C Screening  Completed   HIV Screening  Completed   HPV Vaccine  Aged Out   Meningitis B Vaccine  Aged Out   Complete foot exam   Discontinued   COVID-19 Vaccine  Discontinued   Zoster (Shingles) Vaccine  Discontinued    Advanced directives: (Declined) Advance directive discussed with you today. Even though you declined this today, please call our office should you change your mind, and we can give you the proper paperwork for you to fill out. Advance Care Planning is important because it:  [x]  Makes sure you  receive the medical care that is consistent with your values, goals, and preferences  [x]  It provides guidance to your family and loved ones and reduces their decisional burden about whether or not they are making the right decisions based on your wishes.  Follow the link provided in your after visit summary or read over the paperwork we have mailed to you to help you started getting your Advance Directives in place. If you need assistance in completing these, please reach out to us  so that we can help you!  See attachments for Preventive Care and Fall Prevention Tips.

## 2024-03-27 NOTE — Progress Notes (Signed)
 Subjective:   [name] is a 58 y.o. who presents for a Medicare Wellness preventive visit.  As a reminder, Annual Wellness Visits don't include a physical exam, and some assessments may be limited, especially if this visit is performed virtually. We may recommend an in-person follow-up visit with your provider if needed.  Visit Complete: Virtual I connected with  [name] on 03/27/24 by a audio enabled telemedicine application and verified that I am speaking with the correct person using two identifiers.  Patient Location: Home  Provider Location: Office/Clinic  I discussed the limitations of evaluation and management by telemedicine. The patient expressed understanding and agreed to proceed.  Vital Signs: Because this visit was a virtual/telehealth visit, some criteria may be missing or patient reported. Any vitals not documented were not able to be obtained and vitals that have been documented are patient reported.  VideoDeclined- This patient declined Librarian, academic. Therefore the visit was completed with audio only.  Persons Participating in Visit: Patient.  AWV Questionnaire: Yes: Patient Medicare AWV questionnaire was completed by the patient on 03/23/24; I have confirmed that all information answered by patient is correct and no changes since this date.  Cardiac Risk Factors include: advanced age (>36men, >75 women);dyslipidemia;diabetes mellitus;hypertension;male gender     Objective:    Today's Vitals   03/23/24 1310 03/27/24 1516  Weight:  225 lb (102.1 kg)  PainSc: 8     Body mass index is 28.12 kg/m.     03/27/2024    3:22 PM 12/01/2023    7:34 AM 11/19/2023    1:14 PM 10/26/2023    3:12 PM 07/27/2023    1:19 PM 03/22/2023    3:02 PM 07/06/2022   12:56 PM  Advanced Directives  Does Patient Have a Medical Advance Directive? No No No No No No Yes  Type of Advance Directive       Healthcare Power of Attorney  Would  patient like information on creating a medical advance directive? No - Patient declined No - Patient declined No - Patient declined No - Patient declined Yes (MAU/Ambulatory/Procedural Areas - Information given) No - Patient declined     Current Medications (verified) Outpatient Encounter Medications as of 03/27/2024  Medication Sig   ACCU-CHEK GUIDE test strip USE UP TO SIX TIMES DAILY TO CHECK BLOOD SUGARS   aspirin  EC 325 MG tablet Take 1 tablet (325 mg total) by mouth daily.   baclofen  (LIORESAL ) 10 MG tablet TAKE 1 TABLET BY MOUTH THREE TIMES DAILY   blood glucose meter kit and supplies KIT Dispense based on patient and insurance preference. Use up to four times daily as directed.   celecoxib  (CELEBREX ) 200 MG capsule Take 1 capsule (200 mg total) by mouth 2 (two) times daily.   furosemide  (LASIX ) 20 MG tablet TAKE 1 TABLET BY MOUTH ONCE DAILY . APPOINTMENT REQUIRED FOR FUTURE REFILLS   gabapentin  (NEURONTIN ) 300 MG capsule Take 300 mg by mouth 3 (three) times daily.   insulin  NPH-regular Human (NOVOLIN  70/30 RELION) (70-30) 100 UNIT/ML injection Inject 15 Units into the skin 2 (two) times daily with a meal.   insulin  regular (NOVOLIN  R) 100 units/mL injection    Lancets (ONETOUCH DELICA PLUS LANCET33G) MISC Apply 1 each topically 3 (three) times daily.   lisinopril  (ZESTRIL ) 10 MG tablet TAKE 1 TABLET BY MOUTH ONCE DAILY KEEP  JAN  OFFICE  VISIT   Oxycodone  HCl 10 MG TABS Take 10 mg by mouth 5 (five) times daily.  pentoxifylline  (TRENTAL ) 400 MG CR tablet Take 1 tablet (400 mg total) by mouth 3 (three) times daily with meals.   rosuvastatin  (CRESTOR ) 20 MG tablet Take 1 tablet by mouth once daily   sildenafil  (VIAGRA ) 100 MG tablet Take 100 mg by mouth daily as needed for erectile dysfunction.   tamsulosin  (FLOMAX ) 0.4 MG CAPS capsule Take 0.4 mg by mouth daily.   testosterone  cypionate (DEPO-TESTOSTERONE ) 200 MG/ML injection Inject 1 mL (200 mg total) into the muscle every 14 (fourteen)  days. Significant Risks of heart attack, stroke with continued use. Reducing dose as tolerated highly recommended   [DISCONTINUED] lidocaine  (LIDODERM ) 5 % Place 1 patch onto the skin daily. Remove & Discard patch within 12 hours or as directed by MD   [DISCONTINUED] lidocaine  (XYLOCAINE ) 5 % ointment Apply 1 Application topically as needed.   [DISCONTINUED] pregabalin (LYRICA) 75 MG capsule Take 75 mg by mouth 2 (two) times daily.   No facility-administered encounter medications on file as of 03/27/2024.    Allergies (verified) Bactrim [sulfamethoxazole-trimethoprim], Ciprofloxacin , and Levaquin [levofloxacin]   History: Past Medical History:  Diagnosis Date   Acquired complex renal cyst 09/25/2019   Acute respiratory failure with hypoxia (HCC) secondary to suspected aspiration pneumonia 12/15/2021   AKI (acute kidney injury) (HCC) 07/22/2021   Cancer (HCC)    renal   Chest pain 07/22/2021   CHF (congestive heart failure) (HCC)    Complication of anesthesia    last time came out angry   Decubitus ulcer of sacral area 12/15/2021   Depression    Diabetes mellitus without complication (HCC)    Diabetic infection of left foot (HCC) 04/17/2021   Episode of unresponsiveness 12/15/2021   Gas gangrene of foot (HCC) 07/22/2021   Healthcare maintenance 09/25/2019   Hematuria 09/25/2019   History of kidney stones    Hypertension    Lactic acidosis 12/15/2021   Leukocytosis 09/13/2021   Osteomyelitis (HCC)    PTSD (post-traumatic stress disorder)    Sacral pressure injury of skin 07/27/2021   Septic shock (HCC) 07/22/2021   Severe sepsis with acute organ dysfunction (HCC) 07/22/2021   Shortness of breath 05/16/2021   Stage 3a chronic kidney disease (HCC) 09/25/2019   Systolic dysfunction    Type 2 diabetes mellitus 10/12/2019   Past Surgical History:  Procedure Laterality Date   AMPUTATION Left 07/23/2021   Procedure: LEFT BELOW KNEE AMPUTATION;  Surgeon: Harden Jerona GAILS, MD;   Location[facility];  Service: Orthopedics;  Laterality: Left;   BELOW KNEE LEG AMPUTATION Right    BUBBLE STUDY  07/29/2021   Procedure: BUBBLE STUDY;  Surgeon: Shlomo Wilbert SAUNDERS, MD;  Location: [facility] ENDOSCOPY;  Service: Cardiovascular;;   CARDIOVERSION N/A 07/29/2021   Procedure: CARDIOVERSION;  Surgeon: Shlomo Wilbert SAUNDERS, MD;  Location: [facility] ENDOSCOPY;  Service: Cardiovascular;  Laterality: N/A;   IR RADIOLOGIST EVAL & MGMT  10/18/2023   IR RADIOLOGIST EVAL & MGMT  02/25/2024   RADIOLOGY WITH ANESTHESIA N/A 08/05/2021   Procedure: MRI LUMBAR WITH AND WITHOUT; THORASIC SPINE WITH AND WITHOUT WITH ANESTHESIA;  Surgeon: Radiologist, Medication, MD;  Location: [facility] OR;  Service: Radiology;  Laterality: N/A;   RADIOLOGY WITH ANESTHESIA N/A 08/07/2021   Procedure: MRI WITH LUMBER WITH AND WITHOUT CONTRAST,THORACIC WITH AND WITHOUT CONTRAST;  Surgeon: Radiologist, Medication, MD;  Location: [facility] OR;  Service: Radiology;  Laterality: N/A;   RADIOLOGY WITH ANESTHESIA N/A 09/13/2021   Procedure: MRI WITH ANESTHESIA;  Surgeon: Dolphus Carrion, MD;  Location: [facility] OR;  Service: Radiology;  Laterality: N/A;   TEE WITHOUT CARDIOVERSION N/A 07/29/2021   Procedure: TRANSESOPHAGEAL ECHOCARDIOGRAM (TEE);  Surgeon: Shlomo Wilbert SAUNDERS, MD;  Location: [facility] ENDOSCOPY;  Service: Cardiovascular;  Laterality: N/A;   Family History  Problem Relation Age of Onset   Anxiety disorder Mother    Cancer Father    Coronary artery disease Father    Sleep apnea Neg Hx    Social History   Socioeconomic History   Marital status: Divorced    Spouse name: Not on file   Number of children: Not on file   Years of education: Not on file   Highest education level: Bachelor's degree (e.g., BA, AB, BS)  Occupational History   Not on file  Tobacco Use   Smoking status: Never   Smokeless tobacco: Former    Types: Engineer, drilling   Vaping status: Never Used  Substance and Sexual Activity   Alcohol use: Not Currently   Drug use: Never    Sexual activity: Not Currently  Other Topics Concern   Not on file  Social History Narrative   Pt lives alone   Pt not working    Social Drivers of Corporate investment banker Strain: Low Risk  (03/23/2024)   Overall Financial Resource Strain (CARDIA)    Difficulty of Paying Living Expenses: Not hard at all  Food Insecurity: No Food Insecurity (03/23/2024)   Hunger Vital Sign    Worried About Running Out of Food in the Last Year: Never true    Ran Out of Food in the Last Year: Never true  Transportation Needs: Unmet Transportation Needs (03/23/2024)   PRAPARE - Transportation    Lack of Transportation (Medical): Yes    Lack of Transportation (Non-Medical): Yes  Physical Activity: Inactive (03/23/2024)   Exercise Vital Sign    Days of Exercise per Week: 0 days    Minutes of Exercise per Session: 0 min  Stress: Stress Concern Present (03/23/2024)   [employer] of Occupational Health - Occupational Stress Questionnaire    Feeling of Stress: Rather much  Social Connections: Socially Isolated (03/23/2024)   Social Connection and Isolation Panel    Frequency of Communication with Friends and Family: Twice a week    Frequency of Social Gatherings with Friends and Family: Once a week    Attends Religious Services: Never    Database administrator or Organizations: No    Attends Engineer, structural: Not on file    Marital Status: Divorced    Tobacco Counseling Counseling given: Not Answered    Clinical Intake:  Pre-visit preparation completed: Yes  Pain : 0-10 Pain Score: 8  Pain Type: Chronic pain Pain Location: Generalized Pain Descriptors / Indicators: Aching, Tightness Pain Onset: More than a month ago Pain Frequency: Constant     BMI - recorded: 28.12 Nutritional Status: BMI 25 -29 Overweight Diabetes: Yes CBG done?: Yes (105 per pt) CBG resulted in Enter/ Edit results?: No Did pt. bring in CBG monitor from home?: No  Lab Results  Component Value  Date   HGBA1C 8.1 (H) 06/24/2023   HGBA1C 7.1 (H) 12/23/2022   HGBA1C 11.9 (H) 06/15/2022     How often do you need to have someone help you when you read instructions, pamphlets, or other written materials from your doctor or pharmacy?: 1 - Never  Interpreter Needed?: No  Information entered by :: Ellouise Haws, LPN   Activities of Daily Living     03/23/2024    1:10 PM  11/19/2023    1:19 PM  In your present state of health, do you have any difficulty performing the following activities:  Hearing? 0   Vision? 0   Difficulty concentrating or making decisions? 1   Walking or climbing stairs? 1   Dressing or bathing? 0   Doing errands, shopping? 1 0  Comment due to [employer] and eating ? N   Using the Toilet? N   In the past six months, have you accidently leaked urine? Y   Do you have problems with loss of bowel control? N   Managing your Medications? N   Managing your Finances? N   Housekeeping or managing your Housekeeping? N     Patient Care Team: Jesus Bernardino MATSU, MD as PCP - General (Internal Medicine) Francyne Headland, MD as PCP - Cardiology (Cardiology) Bonner Ade, MD as Consulting Physician (Physical Medicine and Rehabilitation) Cam Morene ORN, MD as Attending Physician (Urology) Marolyn Nest, MD as Consulting Physician (General Surgery) [facility] (Orthopedic Surgery)  I have updated your Care Teams any recent Medical Services you may have received from other providers in the past year.     Assessment:   This is a routine wellness examination for [name].  Hearing/Vision screen Hearing Screening - Comments:: Pt denies any hearing issues  Vision Screening - Comments:: Encouraged to follow up with eye provider    Goals Addressed             This Visit's Progress    Patient Stated       Be able to walk again        Depression Screen     03/27/2024    3:22 PM 07/27/2023    2:41 PM 07/27/2023    1:42 PM 03/22/2023     2:58 PM 12/23/2022    1:36 PM 04/23/2022    1:19 PM 04/06/2022    2:07 PM  PHQ 2/9 Scores  PHQ - 2 Score 2 4 4 4 6 3 2   PHQ- 9 Score 6 10 10 14 16 15 4     Fall Risk     03/23/2024    1:10 PM 03/16/2023    2:28 PM 12/23/2022    1:35 PM 04/23/2022    1:19 PM 04/06/2022    2:10 PM  Fall Risk   Falls in the past year? 0 0 0 0 0  Number falls in past yr: 0  0 0 0  Injury with Fall? 0 0 0 0 0  Risk for fall due to : Impaired mobility Impaired mobility;Impaired balance/gait;Impaired vision;Other (Comment) No Fall Risks No Fall Risks Impaired vision;Impaired mobility  Risk for fall due to: Comment  amputation     Follow up Falls prevention discussed Falls prevention discussed Falls evaluation completed Falls evaluation completed  Falls prevention discussed      Data saved with a previous flowsheet row definition    MEDICARE RISK AT HOME:  Medicare Risk at Home Any stairs in or around the home?: (Patient-Rptd) No If so, are there any without handrails?: (Patient-Rptd) No Home free of loose throw rugs in walkways, pet beds, electrical cords, etc?: (Patient-Rptd) Yes Adequate lighting in your home to reduce risk of falls?: (Patient-Rptd) Yes Life alert?: (Patient-Rptd) No Use of a cane, walker or w/c?: (Patient-Rptd) Yes Grab bars in the bathroom?: (Patient-Rptd) Yes Shower chair or bench in shower?: (Patient-Rptd) Yes Elevated toilet seat or a handicapped toilet?: (Patient-Rptd) No  TIMED UP AND GO:  Was the test performed?  No  Cognitive Function: 6CIT completed        03/27/2024    3:27 PM 03/22/2023    3:03 PM 04/06/2022    2:17 PM  6CIT Screen  What Year? 0 points 0 points 0 points  What month? 0 points 0 points 0 points  What time? 0 points 0 points 0 points  Count back from 20 0 points 0 points 0 points  Months in reverse 0 points 4 points 4 points  Repeat phrase 0 points 4 points 6 points  Total Score 0 points 8 points 10 points    Immunizations Immunization History   Administered Date(s) Administered   Influenza, Seasonal, Injecte, Preservative Fre 07/27/2023   Influenza,inj,Quad PF,6+ Mos 09/25/2019, 07/28/2021   Tdap 09/25/2019    Screening Tests Health Maintenance  Topic Date Due   Pneumococcal Vaccine 43-77 Years old (1 of 2 - PCV) Never done   Hepatitis B Vaccines (1 of 3 - 19+ 3-dose series) Never done   Colonoscopy  Never done   HEMOGLOBIN A1C  12/23/2023   INFLUENZA VACCINE  04/07/2024   OPHTHALMOLOGY EXAM  07/18/2024   DTaP/Tdap/Td (2 - Td or Tdap) 09/24/2029   Hepatitis C Screening  Completed   HIV Screening  Completed   HPV VACCINES  Aged Out   Meningococcal B Vaccine  Aged Out   FOOT EXAM  Discontinued   COVID-19 Vaccine  Discontinued   Zoster Vaccines- Shingrix  Discontinued    Health Maintenance  Health Maintenance Due  Topic Date Due   Pneumococcal Vaccine 69-20 Years old (1 of 2 - PCV) Never done   Hepatitis B Vaccines (1 of 3 - 19+ 3-dose series) Never done   Colonoscopy  Never done   HEMOGLOBIN A1C  12/23/2023   Health Maintenance Items Addressed: See Nurse Notes at the end of this note  Additional Screening:  Vision Screening: Recommended annual ophthalmology exams for early detection of glaucoma and other disorders of the eye. Would you like a referral to an eye doctor? No    Dental Screening: Recommended annual dental exams for proper oral hygiene  Community Resource Referral / Chronic Care Management: CRR required this visit?  No   CCM required this visit?  No   Plan:    I have personally reviewed and noted the following in the patient's chart:   Medical and social history Use of alcohol, tobacco or illicit drugs  Current medications and supplements including opioid prescriptions. Patient is currently taking opioid prescriptions. Information provided to patient regarding non-opioid alternatives. Patient advised to discuss non-opioid treatment plan with their provider. Functional ability and  status Nutritional status Physical activity Advanced directives List of other physicians Hospitalizations, surgeries, and ER visits in previous 12 months Vitals Screenings to include cognitive, depression, and falls Referrals and appointments  In addition, I have reviewed and discussed with patient certain preventive protocols, quality metrics, and best practice recommendations. A written personalized care plan for preventive services as well as general preventive health recommendations were provided to patient.   Ellouise VEAR Haws, LPN   [id number]/[id number]   After Visit Summary: (MyChart) Due to this being a telephonic visit, the after visit summary with patients personalized plan was offered to patient via MyChart   Notes: Please refer to Routing Comments. Will discuss colonoscopy at next visit

## 2024-03-28 ENCOUNTER — Encounter: Payer: Self-pay | Admitting: Orthopedic Surgery

## 2024-03-28 MED ORDER — PENTOXIFYLLINE ER 400 MG PO TBCR: 400.0000 mg | EXTENDED_RELEASE_TABLET | Freq: Three times a day (TID) | ORAL | 3 refills | Status: DC

## 2024-03-28 NOTE — Progress Notes (Signed)
 Office Visit Note   Patient: [name]           Date of Birth: 1966-04-04           MRN: [id number] Visit Date: 03/27/2024              Requested by: Jesus Bernardino MATSU, MD [address] [place],  [place] [address] PCP: Jesus Bernardino MATSU, MD  Chief Complaint  Patient presents with   Right Leg - Pain      HPI: Patient is a 58 year old gentleman status post bilateral transtibial amputation.  Patient is able to wear his prosthesis on the left but cannot wear it on the right due to the increased swelling.  Patient is on Neurontin .  Patient states the swelling and color have improved.  Assessment & Plan: Visit Diagnoses:  1. S/P bilateral below knee amputation (HCC)   2. Pain in right lower leg     Plan: Continue with massage and compression to decrease the swelling.  A prescription was provided to refill his Trental   Follow-Up Instructions: Return in about 4 weeks (around 04/24/2024).   Ortho Exam  Patient is alert, oriented, no adenopathy, well-dressed, normal affect, normal respiratory effort. Examination patient has decreased swallowing and decreased skin color changes of the right residual limb.  The temperature of both residual limbs are equal.  Patient has no hypersensitivity to light touch.  The residual limb has no increased dryness or increased sweating.  While the swelling has improved his right residual limb is still larger than the left.  Currently no signs or symptoms of complex regional pain syndrome.    Imaging: No results found. No images are attached to the encounter.  Labs: Lab Results  Component Value Date   HGBA1C 8.1 (H) 06/24/2023   HGBA1C 7.1 (H) 12/23/2022   HGBA1C 11.9 (H) 06/15/2022   ESRSEDRATE 25 (H) 07/03/2022   ESRSEDRATE 11 12/30/2021   ESRSEDRATE 12 12/29/2021   CRP <1.0 07/03/2022   CRP 0.7 12/30/2021   CRP 1.1 (H) 12/29/2021   REPTSTATUS 04/11/2022 FINAL 04/09/2022   CULT >=100,000 COLONIES/mL PSEUDOMONAS AERUGINOSA (A)  04/09/2022   LABORGA PSEUDOMONAS AERUGINOSA (A) 04/09/2022     Lab Results  Component Value Date   ALBUMIN 3.7 10/26/2023   ALBUMIN 4.0 07/27/2023   ALBUMIN 4.0 06/24/2023    Lab Results  Component Value Date   MG 1.7 12/18/2021   MG 1.4 (L) 12/16/2021   MG 1.5 (L) 08/15/2021   No results found for: VD25OH  No results found for: PREALBUMIN    Latest Ref Rng & Units 12/01/2023    7:08 AM 10/26/2023    2:55 PM 07/27/2023    2:12 PM  CBC EXTENDED  WBC 4.0 - 10.5 K/uL 7.4  6.4  7.4   RBC 4.22 - 5.81 MIL/uL 5.69  5.76  6.20   Hemoglobin 13.0 - 17.0 g/dL 82.4  82.0  81.4   HCT 39.0 - 52.0 % 53.1  51.5  55.0   Platelets 150 - 400 K/uL 202  191  201   NEUT# 1.7 - 7.7 K/uL 5.7  5.2  5.7   Lymph# 0.7 - 4.0 K/uL 0.8  0.5  0.9      There is no height or weight on file to calculate BMI.  Orders:  No orders of the defined types were placed in this encounter.  No orders of the defined types were placed in this encounter.    Procedures: No procedures performed  Clinical  Data: No additional findings.  ROS:  All other systems negative, except as noted in the HPI. Review of Systems  Objective: Vital Signs: There were no vitals taken for this visit.  Specialty Comments:  No specialty comments available.  PMFS History: Patient Active Problem List   Diagnosis Date Noted   Inguinal hernia without obstruction or gangrene 02/17/2024   History of decubitus ulcer 02/17/2024   History of tobacco use 02/17/2024   Complex regional pain syndrome type 2 of right lower extremity 02/16/2024   Umbilical hernia without obstruction and without gangrene 02/16/2024   Adverse reaction to intramuscular testosterone  08/11/2023   History of atrial fibrillation 08/11/2023   Shoulder arthritis 07/28/2023   Amputation stump pain (HCC) 07/28/2023   Chronic back pain 12/24/2022   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HCC) 07/24/2022   Malaise 06/22/2022   Secondary polycythemia 06/22/2022    Amputation below knee (HCC) 04/23/2022   PTSD (post-traumatic stress disorder) 04/23/2022   Generalized anxiety disorder 01/21/2022   Stage 4 skin ulcer of sacral region (HCC) 12/29/2021   Episode of unresponsiveness 12/15/2021   Renal mass 12/15/2021   Stage IV pressure ulcer of sacral region (HCC) 09/15/2021   Insomnia 09/14/2021   Chronic systolic CHF (congestive heart failure) (HCC) 09/14/2021   Hypogonadism in male 09/14/2021   Chronic pain 09/14/2021   Pseudohyponatremia 09/13/2021   Normocytic anemia 09/13/2021   Discitis of thoracic region    Paroxysmal atrial flutter (HCC) 07/22/2021   Chronic diastolic (congestive) heart failure (HCC) 05/16/2021   Lumbar spondylosis 03/25/2021   H/O amputation of leg through tibia and fibula (HCC) 11/10/2019   Degeneration of lumbar intervertebral disc 11/10/2019   Hypercholesterolemia 11/10/2019   Status post bilateral below knee amputation (HCC) 10/30/2019   Dyslipidemia 10/30/2019   Type 1 diabetes mellitus with diabetic polyneuropathy, with long-term current use of insulin  (HCC) 10/12/2019   Hypertensive disorder 09/25/2019   Urinary hesitancy 09/25/2019   Prosthesis adjustments 09/25/2019   Erectile dysfunction 09/25/2019   Palliative care status 09/25/2019   Pulmonary nodules 09/25/2019   Depressive disorder 09/25/2019   Past Medical History:  Diagnosis Date   Acquired complex renal cyst 09/25/2019   Acute respiratory failure with hypoxia (HCC) secondary to suspected aspiration pneumonia 12/15/2021   AKI (acute kidney injury) (HCC) 07/22/2021   Cancer (HCC)    renal   Chest pain 07/22/2021   CHF (congestive heart failure) (HCC)    Complication of anesthesia    last time came out angry   Decubitus ulcer of sacral area 12/15/2021   Depression    Diabetes mellitus without complication (HCC)    Diabetic infection of left foot (HCC) 04/17/2021   Episode of unresponsiveness 12/15/2021   Gas gangrene of foot (HCC) 07/22/2021    Healthcare maintenance 09/25/2019   Hematuria 09/25/2019   History of kidney stones    Hypertension    Lactic acidosis 12/15/2021   Leukocytosis 09/13/2021   Osteomyelitis (HCC)    PTSD (post-traumatic stress disorder)    Sacral pressure injury of skin 07/27/2021   Septic shock (HCC) 07/22/2021   Severe sepsis with acute organ dysfunction (HCC) 07/22/2021   Shortness of breath 05/16/2021   Stage 3a chronic kidney disease (HCC) 09/25/2019   Systolic dysfunction    Type 2 diabetes mellitus 10/12/2019    Family History  Problem Relation Age of Onset   Anxiety disorder Mother    Cancer Father    Coronary artery disease Father    Sleep apnea Neg Hx  Past Surgical History:  Procedure Laterality Date   AMPUTATION Left 07/23/2021   Procedure: LEFT BELOW KNEE AMPUTATION;  Surgeon: Harden Jerona GAILS, MD;  Location: [facility] OR;  Service: Orthopedics;  Laterality: Left;   BELOW KNEE LEG AMPUTATION Right    BUBBLE STUDY  07/29/2021   Procedure: BUBBLE STUDY;  Surgeon: Shlomo Wilbert SAUNDERS, MD;  Location: [facility] ENDOSCOPY;  Service: Cardiovascular;;   CARDIOVERSION N/A 07/29/2021   Procedure: CARDIOVERSION;  Surgeon: Shlomo Wilbert SAUNDERS, MD;  Location: [facility] ENDOSCOPY;  Service: Cardiovascular;  Laterality: N/A;   IR RADIOLOGIST EVAL & MGMT  10/18/2023   IR RADIOLOGIST EVAL & MGMT  02/25/2024   RADIOLOGY WITH ANESTHESIA N/A 08/05/2021   Procedure: MRI LUMBAR WITH AND WITHOUT; THORASIC SPINE WITH AND WITHOUT WITH ANESTHESIA;  Surgeon: Radiologist, Medication, MD;  Location: [facility] OR;  Service: Radiology;  Laterality: N/A;   RADIOLOGY WITH ANESTHESIA N/A 08/07/2021   Procedure: MRI WITH LUMBER WITH AND WITHOUT CONTRAST,THORACIC WITH AND WITHOUT CONTRAST;  Surgeon: Radiologist, Medication, MD;  Location: [facility] OR;  Service: Radiology;  Laterality: N/A;   RADIOLOGY WITH ANESTHESIA N/A 09/13/2021   Procedure: MRI WITH ANESTHESIA;  Surgeon: Dolphus Carrion, MD;  Location: [facility] OR;  Service: Radiology;  Laterality: N/A;   TEE  WITHOUT CARDIOVERSION N/A 07/29/2021   Procedure: TRANSESOPHAGEAL ECHOCARDIOGRAM (TEE);  Surgeon: Shlomo Wilbert SAUNDERS, MD;  Location: [facility] ENDOSCOPY;  Service: Cardiovascular;  Laterality: N/A;   Social History   Occupational History   Not on file  Tobacco Use   Smoking status: Never   Smokeless tobacco: Former    Types: Engineer, drilling   Vaping status: Never Used  Substance and Sexual Activity   Alcohol use: Not Currently   Drug use: Never   Sexual activity: Not Currently

## 2024-04-10 DIAGNOSIS — Z008 Encounter for other general examination: Secondary | ICD-10-CM | POA: Diagnosis not present

## 2024-04-10 DIAGNOSIS — I10 Essential (primary) hypertension: Secondary | ICD-10-CM | POA: Diagnosis not present

## 2024-04-21 ENCOUNTER — Other Ambulatory Visit: Payer: Self-pay | Admitting: Oncology

## 2024-04-21 DIAGNOSIS — D751 Secondary polycythemia: Secondary | ICD-10-CM

## 2024-04-24 ENCOUNTER — Encounter: Payer: Self-pay | Admitting: Internal Medicine

## 2024-04-24 ENCOUNTER — Telehealth: Payer: Self-pay

## 2024-04-24 ENCOUNTER — Ambulatory Visit (INDEPENDENT_AMBULATORY_CARE_PROVIDER_SITE_OTHER): Admitting: Internal Medicine

## 2024-04-24 VITALS — BP 138/86 | HR 88 | Temp 98.0°F | Ht 75.0 in

## 2024-04-24 DIAGNOSIS — Z79890 Hormone replacement therapy (postmenopausal): Secondary | ICD-10-CM | POA: Diagnosis not present

## 2024-04-24 DIAGNOSIS — Z5181 Encounter for therapeutic drug level monitoring: Secondary | ICD-10-CM

## 2024-04-24 DIAGNOSIS — K4090 Unilateral inguinal hernia, without obstruction or gangrene, not specified as recurrent: Secondary | ICD-10-CM

## 2024-04-24 DIAGNOSIS — Z89511 Acquired absence of right leg below knee: Secondary | ICD-10-CM

## 2024-04-24 DIAGNOSIS — M549 Dorsalgia, unspecified: Secondary | ICD-10-CM | POA: Diagnosis not present

## 2024-04-24 DIAGNOSIS — E291 Testicular hypofunction: Secondary | ICD-10-CM | POA: Diagnosis not present

## 2024-04-24 DIAGNOSIS — E785 Hyperlipidemia, unspecified: Secondary | ICD-10-CM

## 2024-04-24 DIAGNOSIS — I5032 Chronic diastolic (congestive) heart failure: Secondary | ICD-10-CM | POA: Diagnosis not present

## 2024-04-24 DIAGNOSIS — G894 Chronic pain syndrome: Secondary | ICD-10-CM

## 2024-04-24 DIAGNOSIS — G8929 Other chronic pain: Secondary | ICD-10-CM

## 2024-04-24 DIAGNOSIS — I10 Essential (primary) hypertension: Secondary | ICD-10-CM | POA: Diagnosis not present

## 2024-04-24 DIAGNOSIS — E1042 Type 1 diabetes mellitus with diabetic polyneuropathy: Secondary | ICD-10-CM

## 2024-04-24 DIAGNOSIS — E1040 Type 1 diabetes mellitus with diabetic neuropathy, unspecified: Secondary | ICD-10-CM

## 2024-04-24 DIAGNOSIS — I4892 Unspecified atrial flutter: Secondary | ICD-10-CM | POA: Diagnosis not present

## 2024-04-24 DIAGNOSIS — I5022 Chronic systolic (congestive) heart failure: Secondary | ICD-10-CM

## 2024-04-24 DIAGNOSIS — K429 Umbilical hernia without obstruction or gangrene: Secondary | ICD-10-CM

## 2024-04-24 DIAGNOSIS — D751 Secondary polycythemia: Secondary | ICD-10-CM

## 2024-04-24 DIAGNOSIS — Z794 Long term (current) use of insulin: Secondary | ICD-10-CM

## 2024-04-24 LAB — LIPID PANEL
Cholesterol: 106 mg/dL (ref 0–200)
HDL: 36.4 mg/dL — ABNORMAL LOW (ref >39.00)
LDL Cholesterol: 52 mg/dL (ref 0–99)
NonHDL: 69.54
Total CHOL/HDL Ratio: 3
Triglycerides: 89 mg/dL (ref 0.0–149.0)
VLDL: 17.8 mg/dL (ref 0.0–40.0)

## 2024-04-24 LAB — CBC WITH DIFFERENTIAL/PLATELET
Basophils Absolute: 0 K/uL (ref 0.0–0.1)
Basophils Relative: 0.5 % (ref 0.0–3.0)
Eosinophils Absolute: 0.1 K/uL (ref 0.0–0.7)
Eosinophils Relative: 2.1 % (ref 0.0–5.0)
HCT: 55.6 % — ABNORMAL HIGH (ref 39.0–52.0)
Hemoglobin: 18.6 g/dL (ref 13.0–17.0)
Lymphocytes Relative: 12.2 % (ref 12.0–46.0)
Lymphs Abs: 0.6 K/uL — ABNORMAL LOW (ref 0.7–4.0)
MCHC: 33.5 g/dL (ref 30.0–36.0)
MCV: 89.7 fl (ref 78.0–100.0)
Monocytes Absolute: 0.6 K/uL (ref 0.1–1.0)
Monocytes Relative: 10.4 % (ref 3.0–12.0)
Neutro Abs: 4 K/uL (ref 1.4–7.7)
Neutrophils Relative %: 74.8 % (ref 43.0–77.0)
Platelets: 194 K/uL (ref 150.0–400.0)
RBC: 6.2 Mil/uL — ABNORMAL HIGH (ref 4.22–5.81)
RDW: 14.5 % (ref 11.5–15.5)
WBC: 5.3 K/uL (ref 4.0–10.5)

## 2024-04-24 LAB — MAGNESIUM: Magnesium: 1.8 mg/dL (ref 1.5–2.5)

## 2024-04-24 LAB — COMPREHENSIVE METABOLIC PANEL WITH GFR
ALT: 33 U/L (ref 0–53)
AST: 29 U/L (ref 0–37)
Albumin: 4 g/dL (ref 3.5–5.2)
Alkaline Phosphatase: 90 U/L (ref 39–117)
BUN: 18 mg/dL (ref 6–23)
CO2: 28 meq/L (ref 19–32)
Calcium: 9.3 mg/dL (ref 8.4–10.5)
Chloride: 103 meq/L (ref 96–112)
Creatinine, Ser: 1.25 mg/dL (ref 0.40–1.50)
GFR: 63.58 mL/min (ref >60.00)
Glucose, Bld: 83 mg/dL (ref 70–99)
Potassium: 3.9 meq/L (ref 3.5–5.1)
Sodium: 139 meq/L (ref 135–145)
Total Bilirubin: 0.6 mg/dL (ref 0.2–1.2)
Total Protein: 6.9 g/dL (ref 6.0–8.3)

## 2024-04-24 LAB — HEMOGLOBIN A1C: Hgb A1c MFr Bld: 7.8 % — ABNORMAL HIGH (ref 4.6–6.5)

## 2024-04-24 LAB — TESTOSTERONE: Testosterone: 119.45 ng/dL — ABNORMAL LOW (ref 300.00–890.00)

## 2024-04-24 MED ORDER — GABAPENTIN 300 MG PO CAPS
300.0000 mg | ORAL_CAPSULE | Freq: Three times a day (TID) | ORAL | 4 refills | Status: DC
Start: 2024-04-24 — End: 2024-07-19

## 2024-04-24 MED ORDER — TESTOSTERONE CYPIONATE 200 MG/ML IM SOLN: 200.0000 mg | INTRAMUSCULAR | 1 refills | Status: DC

## 2024-04-24 NOTE — Assessment & Plan Note (Signed)
 Chronic back pain is due to disc degeneration.

## 2024-04-24 NOTE — Progress Notes (Signed)
 ==============================  [facility]: [phone]   -- Medical Office Visit --  Patient: [name]      Age: 58 y.o.       Sex:  male  Date:   04/24/2024 Today's Healthcare Provider: Bernardino KANDICE Cone, MD  ==============================   Chief Complaint: hernia pain and lab work (Pt requesting blood work today )  Discussed the use of AI scribe software for clinical note transcription with the patient, who gave verbal consent to proceed.  History of Present Illness 58 year old male with an umbilical hernia who presents for follow-up and management of his hernia and other chronic conditions.  He has an umbilical hernia that he is concerned about potentially bursting. He manages it by pushing it in and is frustrated with the delay in getting a referral for surgical evaluation. He is awaiting a referral to [facility] for surgical evaluation.  He has a history of bilateral leg amputations, which complicates his hernia management as he finds it difficult to avoid straining his abdomen. He is concerned about the logistics of moving without straining and is resistant to the idea of going to a nursing home or rehab facility post-surgery due to past negative experiences.  He has PTSD and has had negative experiences with nursing homes in the past, which he attributes to poor care and the development of bed sores. He is adamant about not returning to such facilities.  He manages multiple chronic conditions including diabetes, for which he takes insulin  (1 cc three to four times a day before meals) and lisinopril . He is not using a continuous glucose monitor and checks his blood sugar intermittently. He has not had lab work since March 2025 and is due for a comprehensive panel including A1c, cholesterol, and testosterone  levels.  He is on a regimen of medications including gabapentin  (300 mg three times a day), furosemide , rosuvastatin  (40 mg), and  aspirin  (325 mg). Gabapentin  helps with nerve pain, and he is not using Celebrex  as it was ineffective for his arthritis. He also takes testosterone  injections (200 mg every 14 days).  He has a history of a stage four skin ulcer, which has healed as of November 29, 2022, but left a significant scar. He is no longer receiving wound care.  He has a history of stump pain and is currently seeing a new provider for this issue. He takes pentoxifylline , which he believes helps with blood flow, but it is unclear if it alleviates his stump pain. He is not using a prosthetic due to exposed nerves and pain upon standing.  He has allergies and manages symptoms with Benadryl . He also reports a history of heart issues, for which he takes medications to manage fluid retention and cholesterol.  He is resistant to using a CPAP machine for his sleep apnea and prefers to manage his condition without it.  He reports pain when attempting to use a prosthetic. He has difficulty urinating and does not use a continuous glucose monitor. He reports allergies and is experiencing symptoms.   Background Reviewed: Problem List: has Hypertensive disorder; Urinary hesitancy; Prosthesis adjustments; Erectile dysfunction; Palliative care status; Pulmonary nodules; Depressive disorder; Type 1 diabetes mellitus with diabetic polyneuropathy, with long-term current use of insulin  (HCC); Status post bilateral below knee amputation (HCC); Dyslipidemia; Chronic diastolic (congestive) heart failure (HCC); Paroxysmal atrial flutter (HCC); Discitis of thoracic region; Pseudohyponatremia; Normocytic anemia; Insomnia; Chronic systolic CHF (congestive heart failure) (HCC); Hypogonadism in male; Chronic pain; Episode  of unresponsiveness; Renal mass; Generalized anxiety disorder; Amputation below knee (HCC); H/O amputation of leg through tibia and fibula (HCC); Degeneration of lumbar intervertebral disc; Hypercholesterolemia; Lumbar spondylosis; PTSD  (post-traumatic stress disorder); Malaise; Secondary polycythemia; 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HCC); Chronic back pain; Shoulder arthritis; Amputation stump pain (HCC); Adverse reaction to intramuscular testosterone ; History of atrial fibrillation; Complex regional pain syndrome type 2 of right lower extremity; Umbilical hernia without obstruction and without gangrene; Inguinal hernia without obstruction or gangrene; History of decubitus ulcer; and History of tobacco use on their problem list. Past Medical History:  has a past medical history of Acquired complex renal cyst (09/25/2019), Acute respiratory failure with hypoxia (HCC) secondary to suspected aspiration pneumonia (12/15/2021), AKI (acute kidney injury) (HCC) (07/22/2021), Cancer (HCC), Chest pain (07/22/2021), CHF (congestive heart failure) (HCC), Complication of anesthesia, Decubitus ulcer of sacral area (12/15/2021), Depression, Diabetes mellitus without complication (HCC), Diabetic infection of left foot (HCC) (04/17/2021), Episode of unresponsiveness (12/15/2021), Gas gangrene of foot (HCC) (07/22/2021), Healthcare maintenance (09/25/2019), Hematuria (09/25/2019), History of kidney stones, Hypertension, Lactic acidosis (12/15/2021), Leukocytosis (09/13/2021), Osteomyelitis (HCC), PTSD (post-traumatic stress disorder), Sacral pressure injury of skin (07/27/2021), Septic shock (HCC) (07/22/2021), Severe sepsis with acute organ dysfunction (HCC) (07/22/2021), Shortness of breath (05/16/2021), Stage 3a chronic kidney disease (HCC) (09/25/2019), Stage 4 skin ulcer of sacral region (HCC) (12/29/2021), Stage IV pressure ulcer of sacral region (HCC) (09/15/2021), Systolic dysfunction, and Type 2 diabetes mellitus (10/12/2019). Past Surgical History:   has a past surgical history that includes Below knee leg amputation (Right); Amputation (Left, 07/23/2021); TEE without cardioversion (N/A, 07/29/2021); Cardioversion (N/A, 07/29/2021); Bubble study  (07/29/2021); Radiology with anesthesia (N/A, 08/05/2021); Radiology with anesthesia (N/A, 08/07/2021); Radiology with anesthesia (N/A, 09/13/2021); IR Radiologist Eval & Mgmt (10/18/2023); and IR Radiologist Eval & Mgmt (02/25/2024). Social History:   reports that he has never smoked. He has quit using smokeless tobacco.  His smokeless tobacco use included chew. He reports that he does not currently use alcohol. He reports that he does not use drugs. Family History:  family history includes Anxiety disorder in his mother; Cancer in his father; Coronary artery disease in his father. Allergies:  is allergic to bactrim [sulfamethoxazole-trimethoprim], ciprofloxacin , and levaquin [levofloxacin].   Medication Reconciliation: Current Outpatient Medications on File Prior to Visit  Medication Sig   ACCU-CHEK GUIDE test strip USE UP TO SIX TIMES DAILY TO CHECK BLOOD SUGARS   aspirin  EC 325 MG tablet Take 1 tablet (325 mg total) by mouth daily.   blood glucose meter kit and supplies KIT Dispense based on patient and insurance preference. Use up to four times daily as directed.   furosemide  (LASIX ) 20 MG tablet TAKE 1 TABLET BY MOUTH ONCE DAILY . APPOINTMENT REQUIRED FOR FUTURE REFILLS   insulin  NPH-regular Human (NOVOLIN  70/30 RELION) (70-30) 100 UNIT/ML injection Inject 15 Units into the skin 2 (two) times daily with a meal.   insulin  regular (NOVOLIN  R) 100 units/mL injection    Lancets (ONETOUCH DELICA PLUS LANCET33G) MISC Apply 1 each topically 3 (three) times daily.   lisinopril  (ZESTRIL ) 10 MG tablet TAKE 1 TABLET BY MOUTH ONCE DAILY KEEP  JAN  OFFICE  VISIT   Oxycodone  HCl 10 MG TABS Take 10 mg by mouth 5 (five) times daily.   pentoxifylline  (TRENTAL ) 400 MG CR tablet Take 1 tablet (400 mg total) by mouth 3 (three) times daily with meals.   rosuvastatin  (CRESTOR ) 20 MG tablet Take 1 tablet by mouth once daily   sildenafil  (VIAGRA ) 100 MG tablet Take  100 mg by mouth daily as needed for erectile  dysfunction.   tamsulosin  (FLOMAX ) 0.4 MG CAPS capsule Take 0.4 mg by mouth daily.   No current facility-administered medications on file prior to visit.   Medications Discontinued During This Encounter  Medication Reason   celecoxib  (CELEBREX ) 200 MG capsule Ineffective   baclofen  (LIORESAL ) 10 MG tablet    testosterone  cypionate (DEPO-TESTOSTERONE ) 200 MG/ML injection Reorder   gabapentin  (NEURONTIN ) 300 MG capsule Reorder     Physical Exam:    04/24/2024    1:02 PM 03/27/2024    3:16 PM 02/16/2024    4:24 PM  Vitals with BMI  Height 6' 3    Weight -- 225 lbs   Systolic 138  150  Diastolic 86  88  Pulse 88    Vital signs reviewed.  Nursing notes reviewed. Weight trend reviewed. Physical Activity: Inactive (03/23/2024)   Exercise Vital Sign    Days of Exercise per Week: 0 days    Minutes of Exercise per Session: 0 min   General Appearance:  No acute distress appreciable.   Well-groomed, healthy-appearing male.  Well proportioned with no abnormal fat distribution.  Good muscle tone. Pulmonary:  Normal work of breathing at rest, no respiratory distress apparent. SpO2: 93 %  Musculoskeletal: Below knee amputation of left lower extremity is noted. Below knee amputation of right lower extremity is noted.  He has tenderness on right stump and cannot wear prosthetic on that side.  Neurological:  Awake, alert, oriented, and engaged.  No obvious focal neurological deficits or cognitive impairments.  Sensorium seems unclouded.   Speech is clear and coherent with logical content. Psychiatric:  Appropriate mood, pleasant and cooperative demeanor, thoughtful and engaged during the exam  Results:    03/27/2024    3:22 PM 07/27/2023    2:41 PM 07/27/2023    1:42 PM 03/22/2023    2:58 PM  PHQ 2/9 Scores  PHQ - 2 Score 2 4 4 4   PHQ- 9 Score 6 10 10 14    Verbalized to patient: Results LABS Hemoglobin: elevated    Results for orders placed or performed in visit on 04/24/24  CBC with  Differential/Platelet  Result Value Ref Range   WBC 5.3 4.0 - 10.5 K/uL   RBC 6.20 (H) 4.22 - 5.81 Mil/uL   Hemoglobin 18.6 Repeated and verified X2. (HH) 13.0 - 17.0 g/dL   HCT 44.3 Repeated and verified X2. (H) 39.0 - 52.0 %   MCV 89.7 78.0 - 100.0 fl   MCHC 33.5 30.0 - 36.0 g/dL   RDW 85.4 88.4 - 84.4 %   Platelets 194.0 150.0 - 400.0 K/uL   Neutrophils Relative % 74.8 43.0 - 77.0 %   Lymphocytes Relative 12.2 12.0 - 46.0 %   Monocytes Relative 10.4 3.0 - 12.0 %   Eosinophils Relative 2.1 0.0 - 5.0 %   Basophils Relative 0.5 0.0 - 3.0 %   Neutro Abs 4.0 1.4 - 7.7 K/uL   Lymphs Abs 0.6 (L) 0.7 - 4.0 K/uL   Monocytes Absolute 0.6 0.1 - 1.0 K/uL   Eosinophils Absolute 0.1 0.0 - 0.7 K/uL   Basophils Absolute 0.0 0.0 - 0.1 K/uL  Comprehensive metabolic panel with GFR  Result Value Ref Range   Sodium 139 135 - 145 mEq/L   Potassium 3.9 3.5 - 5.1 mEq/L   Chloride 103 96 - 112 mEq/L   CO2 28 19 - 32 mEq/L   Glucose, Bld 83 70 - 99 mg/dL  BUN 18 6 - 23 mg/dL   Creatinine, Ser 8.74 0.40 - 1.50 mg/dL   Total Bilirubin 0.6 0.2 - 1.2 mg/dL   Alkaline Phosphatase 90 39 - 117 U/L   AST 29 0 - 37 U/L   ALT 33 0 - 53 U/L   Total Protein 6.9 6.0 - 8.3 g/dL   Albumin 4.0 3.5 - 5.2 g/dL   GFR 36.41 >39.99 mL/min   Calcium  9.3 8.4 - 10.5 mg/dL  HgB J8r  Result Value Ref Range   Hgb A1c MFr Bld 7.8 (H) 4.6 - 6.5 %  Testosterone   Result Value Ref Range   Testosterone  119.45 (L) 300.00 - 890.00 ng/dL  Lipid panel  Result Value Ref Range   Cholesterol 106 0 - 200 mg/dL   Triglycerides 10.9 0.0 - 149.0 mg/dL   HDL 63.59 (L) >60.99 mg/dL   VLDL 82.1 0.0 - 59.9 mg/dL   LDL Cholesterol 52 0 - 99 mg/dL   Total CHOL/HDL Ratio 3    NonHDL 69.54   Magnesium   Result Value Ref Range   Magnesium  1.8 1.5 - 2.5 mg/dL   Office Visit on 91/81/7974  Component Date Value Ref Range Status   WBC 04/24/2024 5.3  4.0 - 10.5 K/uL Final   RBC 04/24/2024 6.20 (H)  4.22 - 5.81 Mil/uL Final   Hemoglobin  04/24/2024 18.6 Repeated and verified X2. (HH)  13.0 - 17.0 g/dL Final   HCT 91/81/7974 55.6 Repeated and verified X2. (H)  39.0 - 52.0 % Final   MCV 04/24/2024 89.7  78.0 - 100.0 fl Final   MCHC 04/24/2024 33.5  30.0 - 36.0 g/dL Final   RDW 91/81/7974 14.5  11.5 - 15.5 % Final   Platelets 04/24/2024 194.0  150.0 - 400.0 K/uL Final   Neutrophils Relative % 04/24/2024 74.8  43.0 - 77.0 % Final   Lymphocytes Relative 04/24/2024 12.2  12.0 - 46.0 % Final   Monocytes Relative 04/24/2024 10.4  3.0 - 12.0 % Final   Eosinophils Relative 04/24/2024 2.1  0.0 - 5.0 % Final   Basophils Relative 04/24/2024 0.5  0.0 - 3.0 % Final   Neutro Abs 04/24/2024 4.0  1.4 - 7.7 K/uL Final   Lymphs Abs 04/24/2024 0.6 (L)  0.7 - 4.0 K/uL Final   Monocytes Absolute 04/24/2024 0.6  0.1 - 1.0 K/uL Final   Eosinophils Absolute 04/24/2024 0.1  0.0 - 0.7 K/uL Final   Basophils Absolute 04/24/2024 0.0  0.0 - 0.1 K/uL Final   Sodium 04/24/2024 139  135 - 145 mEq/L Final   Potassium 04/24/2024 3.9  3.5 - 5.1 mEq/L Final   Chloride 04/24/2024 103  96 - 112 mEq/L Final   CO2 04/24/2024 28  19 - 32 mEq/L Final   Glucose, Bld 04/24/2024 83  70 - 99 mg/dL Final   BUN 91/81/7974 18  6 - 23 mg/dL Final   Creatinine, Ser 04/24/2024 1.25  0.40 - 1.50 mg/dL Final   Total Bilirubin 04/24/2024 0.6  0.2 - 1.2 mg/dL Final   Alkaline Phosphatase 04/24/2024 90  39 - 117 U/L Final   AST 04/24/2024 29  0 - 37 U/L Final   ALT 04/24/2024 33  0 - 53 U/L Final   Total Protein 04/24/2024 6.9  6.0 - 8.3 g/dL Final   Albumin 91/81/7974 4.0  3.5 - 5.2 g/dL Final   GFR 91/81/7974 63.58  >60.00 mL/min Final   Calcium  04/24/2024 9.3  8.4 - 10.5 mg/dL Final   Hgb J8r MFr Bld  04/24/2024 7.8 (H)  4.6 - 6.5 % Final   Testosterone  04/24/2024 119.45 (L)  300.00 - 890.00 ng/dL Final   Cholesterol 91/81/7974 106  0 - 200 mg/dL Final   Triglycerides 91/81/7974 89.0  0.0 - 149.0 mg/dL Final   HDL 91/81/7974 36.40 (L)  >60.99 mg/dL Final   VLDL  91/81/7974 17.8  0.0 - 40.0 mg/dL Final   LDL Cholesterol 04/24/2024 52  0 - 99 mg/dL Final   Total CHOL/HDL Ratio 04/24/2024 3   Final   NonHDL 04/24/2024 69.54   Final   Magnesium  04/24/2024 1.8  1.5 - 2.5 mg/dL Final  Admission on 96/73/7974, Discharged on 12/01/2023  Component Date Value Ref Range Status   Glucose-Capillary 12/01/2023 82  70 - 99 mg/dL Final   Comment 1 96/73/7974 Notify RN   Final   Comment 2 12/01/2023 Document in Chart   Final  Hospital Outpatient Visit on 12/01/2023  Component Date Value Ref Range Status   WBC 12/01/2023 7.4  4.0 - 10.5 K/uL Final   RBC 12/01/2023 5.69  4.22 - 5.81 MIL/uL Final   Hemoglobin 12/01/2023 17.5 (H)  13.0 - 17.0 g/dL Final   HCT 96/73/7974 53.1 (H)  39.0 - 52.0 % Final   MCV 12/01/2023 93.3  80.0 - 100.0 fL Final   MCH 12/01/2023 30.8  26.0 - 34.0 pg Final   MCHC 12/01/2023 33.0  30.0 - 36.0 g/dL Final   RDW 96/73/7974 14.1  11.5 - 15.5 % Final   Platelets 12/01/2023 202  150 - 400 K/uL Final   nRBC 12/01/2023 0.0  0.0 - 0.2 % Final   Neutrophils Relative % 12/01/2023 78  % Final   Neutro Abs 12/01/2023 5.7  1.7 - 7.7 K/uL Final   Lymphocytes Relative 12/01/2023 11  % Final   Lymphs Abs 12/01/2023 0.8  0.7 - 4.0 K/uL Final   Monocytes Relative 12/01/2023 9  % Final   Monocytes Absolute 12/01/2023 0.7  0.1 - 1.0 K/uL Final   Eosinophils Relative 12/01/2023 1  % Final   Eosinophils Absolute 12/01/2023 0.1  0.0 - 0.5 K/uL Final   Basophils Relative 12/01/2023 0  % Final   Basophils Absolute 12/01/2023 0.0  0.0 - 0.1 K/uL Final   Immature Granulocytes 12/01/2023 1  % Final   Abs Immature Granulocytes 12/01/2023 0.07  0.00 - 0.07 K/uL Final   Sodium 12/01/2023 140  135 - 145 mmol/L Final   Potassium 12/01/2023 3.7  3.5 - 5.1 mmol/L Final   Chloride 12/01/2023 107  98 - 111 mmol/L Final   CO2 12/01/2023 23  22 - 32 mmol/L Final   Glucose, Bld 12/01/2023 82  70 - 99 mg/dL Final   BUN 96/73/7974 19  6 - 20 mg/dL Final   Creatinine,  Ser 12/01/2023 1.33 (H)  0.61 - 1.24 mg/dL Final   Calcium  12/01/2023 8.9  8.9 - 10.3 mg/dL Final   GFR, Estimated 12/01/2023 >60  >60 mL/min Final   Anion gap 12/01/2023 10  5 - 15 Final   Prothrombin Time 12/01/2023 13.1  11.4 - 15.2 seconds Final   INR 12/01/2023 1.0  0.8 - 1.2 Final  Scanned Document on 11/10/2023  Component Date Value Ref Range Status   HM Diabetic Eye Exam 07/19/2023 No Retinopathy  No Retinopathy Final  Appointment on 10/26/2023  Component Date Value Ref Range Status   Erythropoietin  10/26/2023 23.4 (H)  2.6 - 18.5 mIU/mL Final   Sodium 10/26/2023 140  135 - 145 mmol/L Final  Potassium 10/26/2023 3.8  3.5 - 5.1 mmol/L Final   Chloride 10/26/2023 106  98 - 111 mmol/L Final   CO2 10/26/2023 29  22 - 32 mmol/L Final   Glucose, Bld 10/26/2023 94  70 - 99 mg/dL Final   BUN 97/81/7974 18  6 - 20 mg/dL Final   Creatinine 97/81/7974 1.30 (H)  0.61 - 1.24 mg/dL Final   Calcium  10/26/2023 9.0  8.9 - 10.3 mg/dL Final   Total Protein 97/81/7974 6.6  6.5 - 8.1 g/dL Final   Albumin 97/81/7974 3.7  3.5 - 5.0 g/dL Final   AST 97/81/7974 24  15 - 41 U/L Final   ALT 10/26/2023 33  0 - 44 U/L Final   Alkaline Phosphatase 10/26/2023 92  38 - 126 U/L Final   Total Bilirubin 10/26/2023 0.5  0.0 - 1.2 mg/dL Final   GFR, Estimated 10/26/2023 >60  >60 mL/min Final   Anion gap 10/26/2023 5  5 - 15 Final   WBC Count 10/26/2023 6.4  4.0 - 10.5 K/uL Final   RBC 10/26/2023 5.76  4.22 - 5.81 MIL/uL Final   Hemoglobin 10/26/2023 17.9 (H)  13.0 - 17.0 g/dL Final   HCT 97/81/7974 51.5  39.0 - 52.0 % Final   MCV 10/26/2023 89.4  80.0 - 100.0 fL Final   MCH 10/26/2023 31.1  26.0 - 34.0 pg Final   MCHC 10/26/2023 34.8  30.0 - 36.0 g/dL Final   RDW 97/81/7974 15.4  11.5 - 15.5 % Final   Platelet Count 10/26/2023 191  150 - 400 K/uL Final   nRBC 10/26/2023 0.0  0.0 - 0.2 % Final   Neutrophils Relative % 10/26/2023 81  % Final   Neutro Abs 10/26/2023 5.2  1.7 - 7.7 K/uL Final   Lymphocytes  Relative 10/26/2023 8  % Final   Lymphs Abs 10/26/2023 0.5 (L)  0.7 - 4.0 K/uL Final   Monocytes Relative 10/26/2023 9  % Final   Monocytes Absolute 10/26/2023 0.6  0.1 - 1.0 K/uL Final   Eosinophils Relative 10/26/2023 1  % Final   Eosinophils Absolute 10/26/2023 0.1  0.0 - 0.5 K/uL Final   Basophils Relative 10/26/2023 0  % Final   Basophils Absolute 10/26/2023 0.0  0.0 - 0.1 K/uL Final   Immature Granulocytes 10/26/2023 1  % Final   Abs Immature Granulocytes 10/26/2023 0.04  0.00 - 0.07 K/uL Final  Appointment on 07/27/2023  Component Date Value Ref Range Status   Erythropoietin  07/27/2023 40.7 (H)  2.6 - 18.5 mIU/mL Final   LDH 07/27/2023 166  98 - 192 U/L Final   Ferritin 07/27/2023 25  24 - 336 ng/mL Final   Sodium 07/27/2023 139  135 - 145 mmol/L Final   Potassium 07/27/2023 4.2  3.5 - 5.1 mmol/L Final   Chloride 07/27/2023 104  98 - 111 mmol/L Final   CO2 07/27/2023 30  22 - 32 mmol/L Final   Glucose, Bld 07/27/2023 100 (H)  70 - 99 mg/dL Final   BUN 88/80/7975 18  6 - 20 mg/dL Final   Creatinine, Ser 07/27/2023 1.19  0.61 - 1.24 mg/dL Final   Calcium  07/27/2023 9.4  8.9 - 10.3 mg/dL Final   Total Protein 88/80/7975 6.7  6.5 - 8.1 g/dL Final   Albumin 88/80/7975 4.0  3.5 - 5.0 g/dL Final   AST 88/80/7975 32  15 - 41 U/L Final   ALT 07/27/2023 45 (H)  0 - 44 U/L Final   Alkaline Phosphatase 07/27/2023 85  38 -  126 U/L Final   Total Bilirubin 07/27/2023 0.6  <1.2 mg/dL Final   GFR, Estimated 07/27/2023 >60  >60 mL/min Final   Anion gap 07/27/2023 5  5 - 15 Final   WBC 07/27/2023 7.4  4.0 - 10.5 K/uL Final   RBC 07/27/2023 6.20 (H)  4.22 - 5.81 MIL/uL Final   Hemoglobin 07/27/2023 18.5 (H)  13.0 - 17.0 g/dL Final   HCT 88/80/7975 55.0 (H)  39.0 - 52.0 % Final   MCV 07/27/2023 88.7  80.0 - 100.0 fL Final   MCH 07/27/2023 29.8  26.0 - 34.0 pg Final   MCHC 07/27/2023 33.6  30.0 - 36.0 g/dL Final   RDW 88/80/7975 13.8  11.5 - 15.5 % Final   Platelets 07/27/2023 201  150 - 400  K/uL Final   nRBC 07/27/2023 0.0  0.0 - 0.2 % Final   Neutrophils Relative % 07/27/2023 77  % Final   Neutro Abs 07/27/2023 5.7  1.7 - 7.7 K/uL Final   Lymphocytes Relative 07/27/2023 12  % Final   Lymphs Abs 07/27/2023 0.9  0.7 - 4.0 K/uL Final   Monocytes Relative 07/27/2023 8  % Final   Monocytes Absolute 07/27/2023 0.6  0.1 - 1.0 K/uL Final   Eosinophils Relative 07/27/2023 2  % Final   Eosinophils Absolute 07/27/2023 0.1  0.0 - 0.5 K/uL Final   Basophils Relative 07/27/2023 0  % Final   Basophils Absolute 07/27/2023 0.0  0.0 - 0.1 K/uL Final   Immature Granulocytes 07/27/2023 1  % Final   Abs Immature Granulocytes 07/27/2023 0.04  0.00 - 0.07 K/uL Final  Infusion on 07/27/2023  Component Date Value Ref Range Status   Iron 07/27/2023 62  45 - 182 ug/dL Final   TIBC 88/80/7975 405  250 - 450 ug/dL Final   Saturation Ratios 07/27/2023 15 (L)  17.9 - 39.5 % Final   UIBC 07/27/2023 343  ug/dL Final  Office Visit on 06/24/2023  Component Date Value Ref Range Status   Sodium 06/24/2023 136  135 - 145 mEq/L Final   Potassium 06/24/2023 4.5  3.5 - 5.1 mEq/L Final   Chloride 06/24/2023 103  96 - 112 mEq/L Final   CO2 06/24/2023 23  19 - 32 mEq/L Final   Glucose, Bld 06/24/2023 235 (H)  70 - 99 mg/dL Final   BUN 89/82/7975 18  6 - 23 mg/dL Final   Creatinine, Ser 06/24/2023 1.07  0.40 - 1.50 mg/dL Final   Total Bilirubin 06/24/2023 0.5  0.2 - 1.2 mg/dL Final   Alkaline Phosphatase 06/24/2023 95  39 - 117 U/L Final   AST 06/24/2023 33  0 - 37 U/L Final   ALT 06/24/2023 39  0 - 53 U/L Final   Total Protein 06/24/2023 6.9  6.0 - 8.3 g/dL Final   Albumin 89/82/7975 4.0  3.5 - 5.2 g/dL Final   GFR 89/82/7975 77.08  >60.00 mL/min Final   Calcium  06/24/2023 9.4  8.4 - 10.5 mg/dL Final   WBC 89/82/7975 6.6  4.0 - 10.5 K/uL Final   RBC 06/24/2023 6.14 (H)  4.22 - 5.81 Mil/uL Final   Hemoglobin 06/24/2023 18.8 Repeated and verified X2. (HH)  13.0 - 17.0 g/dL Final   HCT 89/82/7975 56.9 (H)   39.0 - 52.0 % Final   MCV 06/24/2023 92.7  78.0 - 100.0 fl Final   MCHC 06/24/2023 33.0  30.0 - 36.0 g/dL Final   RDW 89/82/7975 14.2  11.5 - 15.5 % Final   Platelets 06/24/2023  210.0  150.0 - 400.0 K/uL Final   Neutrophils Relative % 06/24/2023 80.7 (H)  43.0 - 77.0 % Final   Lymphocytes Relative 06/24/2023 9.0 (L)  12.0 - 46.0 % Final   Monocytes Relative 06/24/2023 8.3  3.0 - 12.0 % Final   Eosinophils Relative 06/24/2023 1.6  0.0 - 5.0 % Final   Basophils Relative 06/24/2023 0.4  0.0 - 3.0 % Final   Neutro Abs 06/24/2023 5.4  1.4 - 7.7 K/uL Final   Lymphs Abs 06/24/2023 0.6 (L)  0.7 - 4.0 K/uL Final   Monocytes Absolute 06/24/2023 0.6  0.1 - 1.0 K/uL Final   Eosinophils Absolute 06/24/2023 0.1  0.0 - 0.7 K/uL Final   Basophils Absolute 06/24/2023 0.0  0.0 - 0.1 K/uL Final   TSH 06/24/2023 2.340  0.450 - 4.500 uIU/mL Final   Cholesterol 06/24/2023 105  <200 mg/dL Final   HDL 89/82/7975 37 (L)  > OR = 40 mg/dL Final   Triglycerides 89/82/7975 127  <150 mg/dL Final   LDL Cholesterol (Calc) 06/24/2023 47  mg/dL (calc) Final   Total CHOL/HDL Ratio 06/24/2023 2.8  <4.9 (calc) Final   Non-HDL Cholesterol (Calc) 06/24/2023 68  <130 mg/dL (calc) Final   Testosterone  06/24/2023 745.16  300.00 - 890.00 ng/dL Final   Hgb J8r MFr Bld 06/24/2023 8.1 (H)  4.6 - 6.5 % Final  Office Visit on 12/23/2022  Component Date Value Ref Range Status   Testosterone  12/23/2022 853.25  300.00 - 890.00 ng/dL Final   WBC 95/82/7975 6.2  4.0 - 10.5 K/uL Final   RBC 12/23/2022 6.27 (H)  4.22 - 5.81 Mil/uL Final   Hemoglobin 12/23/2022 19.4 Repeated and verified X2. (HH)  13.0 - 17.0 g/dL Final   HCT 95/82/7975 56.6 Repeated and verified X2. (H)  39.0 - 52.0 % Final   MCV 12/23/2022 90.3  78.0 - 100.0 fl Final   MCHC 12/23/2022 34.2  30.0 - 36.0 g/dL Final   RDW 95/82/7975 13.7  11.5 - 15.5 % Final   Platelets 12/23/2022 193.0  150.0 - 400.0 K/uL Final   Neutrophils Relative % 12/23/2022 76.9  43.0 - 77.0 %  Final   Lymphocytes Relative 12/23/2022 11.7 (L)  12.0 - 46.0 % Final   Monocytes Relative 12/23/2022 9.3  3.0 - 12.0 % Final   Eosinophils Relative 12/23/2022 1.8  0.0 - 5.0 % Final   Basophils Relative 12/23/2022 0.3  0.0 - 3.0 % Final   Neutro Abs 12/23/2022 4.7  1.4 - 7.7 K/uL Final   Lymphs Abs 12/23/2022 0.7  0.7 - 4.0 K/uL Final   Monocytes Absolute 12/23/2022 0.6  0.1 - 1.0 K/uL Final   Eosinophils Absolute 12/23/2022 0.1  0.0 - 0.7 K/uL Final   Basophils Absolute 12/23/2022 0.0  0.0 - 0.1 K/uL Final   Sodium 12/23/2022 139  135 - 145 mEq/L Final   Potassium 12/23/2022 4.2  3.5 - 5.1 mEq/L Final   Chloride 12/23/2022 103  96 - 112 mEq/L Final   CO2 12/23/2022 24  19 - 32 mEq/L Final   Glucose, Bld 12/23/2022 228 (H)  70 - 99 mg/dL Final   BUN 95/82/7975 25 (H)  6 - 23 mg/dL Final   Creatinine, Ser 12/23/2022 1.19  0.40 - 1.50 mg/dL Final   Total Bilirubin 12/23/2022 0.9  0.2 - 1.2 mg/dL Final   Alkaline Phosphatase 12/23/2022 100  39 - 117 U/L Final   AST 12/23/2022 30  0 - 37 U/L Final   ALT 12/23/2022 55 (H)  0 - 53 U/L Final   Total Protein 12/23/2022 6.8  6.0 - 8.3 g/dL Final   Albumin 95/82/7975 4.2  3.5 - 5.2 g/dL Final   GFR 95/82/7975 68.09  >60.00 mL/min Final   Calcium  12/23/2022 9.6  8.4 - 10.5 mg/dL Final   Cholesterol 95/82/7975 124  0 - 200 mg/dL Final   Triglycerides 95/82/7975 141.0  0.0 - 149.0 mg/dL Final   HDL 95/82/7975 45.20  >39.00 mg/dL Final   VLDL 95/82/7975 28.2  0.0 - 40.0 mg/dL Final   LDL Cholesterol 12/23/2022 51  0 - 99 mg/dL Final   Total CHOL/HDL Ratio 12/23/2022 3   Final   NonHDL 12/23/2022 79.26   Final   Hgb A1c MFr Bld 12/23/2022 7.1 (H)  4.6 - 6.5 % Final  Lab on 07/03/2022  Component Date Value Ref Range Status   MICRO NUMBER: 07/03/2022 [id number]   Final   SPECIMEN QUALITY: 07/03/2022 Adequate   Final   Source 07/03/2022 RT AC TK   Final   STATUS: 07/03/2022 FINAL   Final   Result: 07/03/2022 No growth after 5 days   Final    COMMENT: 07/03/2022 Aerobic and anaerobic bottle received.   Final   WBC 07/03/2022 4.6  4.0 - 10.5 K/uL Final   RBC 07/03/2022 5.80  4.22 - 5.81 Mil/uL Final   Platelets 07/03/2022 179.0  150.0 - 400.0 K/uL Final   Hemoglobin 07/03/2022 17.7 (H)  13.0 - 17.0 g/dL Final   HCT 89/72/7976 53.3 (H)  39.0 - 52.0 % Final   MCV 07/03/2022 91.8  78.0 - 100.0 fl Final   MCHC 07/03/2022 33.2  30.0 - 36.0 g/dL Final   RDW 89/72/7976 14.0  11.5 - 15.5 % Final   Sodium 07/03/2022 138  135 - 145 mEq/L Final   Potassium 07/03/2022 4.3  3.5 - 5.1 mEq/L Final   Chloride 07/03/2022 102  96 - 112 mEq/L Final   CO2 07/03/2022 28  19 - 32 mEq/L Final   Glucose, Bld 07/03/2022 195 (H)  70 - 99 mg/dL Final   BUN 89/72/7976 30 (H)  6 - 23 mg/dL Final   Creatinine, Ser 07/03/2022 1.12  0.40 - 1.50 mg/dL Final   Total Bilirubin 07/03/2022 0.5  0.2 - 1.2 mg/dL Final   Alkaline Phosphatase 07/03/2022 73  39 - 117 U/L Final   AST 07/03/2022 24  0 - 37 U/L Final   ALT 07/03/2022 29  0 - 53 U/L Final   Total Protein 07/03/2022 6.8  6.0 - 8.3 g/dL Final   Albumin 89/72/7976 4.0  3.5 - 5.2 g/dL Final   GFR 89/72/7976 73.47  >60.00 mL/min Final   Calcium  07/03/2022 9.5  8.4 - 10.5 mg/dL Final   CRP 89/72/7976 <1.0  0.5 - 20.0 mg/dL Final   Sed Rate 89/72/7976 25 (H)  0 - 20 mm/hr Final  There may be more visits with results that are not included.  No image results found. IR Radiologist Eval & Mgmt Result Date: 02/25/2024 CLINICAL DATA:  IR clinic follow-up. EXAM: IR EVAL AND MANAGEMENT COMPARISON:  None Available. FINDINGS: See dictated note in Epic. IMPRESSION: See dictated note in Epic. Electronically Signed   By: Marcey Moan M.D.   On: 02/25/2024 15:34         ASSESSMENT & PLAN   Assessment & Plan Umbilical hernia without obstruction and without gangrene Inguinal hernia without obstruction or gangrene, recurrence not specified, unspecified laterality The umbilical hernia requires surgical intervention  due to  difficulty managing abdominal pressure from bilateral below-knee amputations. The inguinal hernia is present but not prioritized for immediate surgery. He prefers surgery at [facility] and refuses nursing home care post-surgery. Refer to a [facility] for urgent umbilical hernia repair and discuss potential inpatient rehab post-surgery with the surgeon. Dyslipidemia Will order lab testing to guide management.  Continue(s) with rosuvastatin  Hypogonadism in male Encounter for monitoring testosterone  replacement therapy High risk testosterone  continued after discussion of polycytemia and possible obstructive sleep apnea He has hematology referral tomorrow. Testosterone  levels need checking, and current testosterone  therapy is ongoing. He prefers afternoon appointments for blood tests. Order a testosterone  level test and continue testosterone  cypionate 200 mg intramuscular every 14 days. Hypertension, unspecified type BP Readings from Last 30 Encounters:  04/24/24 138/86  02/16/24 (!) 150/88  12/29/23 134/82  12/01/23 117/80  10/26/23 110/88  10/18/23 (!) 146/92  09/20/23 112/76  08/11/23 (!) 151/86  07/28/23 (!) 158/98  07/27/23 122/81  06/24/23 (!) 146/100  12/23/22 (!) 140/90  07/24/22 138/82  06/22/22 138/66  06/15/22 134/84  04/23/22 130/78  04/14/22 126/68  04/09/22 (!) 172/98  12/31/21 110/67  12/26/21 (!) 155/85  12/18/21 (!) 144/88  12/05/21 (!) 150/117  10/02/21 139/75  09/19/21 (!) 119/56  09/09/21 (!) 160/89  05/23/21 113/62  05/16/21 126/78  04/24/21 (!) 170/62  05/29/20 (!) 170/112  04/05/20 124/74   continues on, as of 04/24/2024 Current hypertension medications:       Sig   furosemide  (LASIX ) 20 MG tablet TAKE 1 TABLET BY MOUTH ONCE DAILY . APPOINTMENT REQUIRED FOR FUTURE REFILLS   lisinopril  (ZESTRIL ) 10 MG tablet TAKE 1 TABLET BY MOUTH ONCE DAILY KEEP  JAN  OFFICE  VISIT   sildenafil  (VIAGRA ) 100 MG tablet Take 100 mg  by mouth daily as needed for erectile dysfunction.      Borderline controlled. Paroxysmal atrial flutter (HCC) Occasional heart flutter is noted. Check magnesium  levels due to arrhythmia. Type 1 diabetes mellitus with diabetic polyneuropathy, with long-term current use of insulin  (HCC) Type 1 diabetes mellitus with diabetic neuropathy, unspecified (HCC) Encounter for long-term (current) use of insulin  (HCC) Diabetes is managed with insulin  therapy, using insulin  NPH from [employer], administered 1 cc three to four times daily before meals. Blood glucose monitoring is intermittent, and no continuous glucose monitor is used. Order A1c and microalbumin tests to assess kidney damage and continue the current insulin  regimen. History of below-knee amputation of right lower extremity (HCC) Persistent stump pain with exposed nerves prevents prosthetic use. Gabapentin  effectively manages nerve pain, while previous Celebrex  was ineffective. He is interested in advanced prosthetics and clinical trials. Continue gabapentin  300 mg three times daily and refer to [facility] for potential clinical trial enrollment for advanced prosthetics. Chronic systolic CHF (congestive heart failure) (HCC) Chronic diastolic (congestive) heart failure (HCC) Continue furosemide  20 mg daily and rosuvastatin  40 mg daily. Chronic pain syndrome Chronic back pain, unspecified back location, unspecified back pain laterality Chronic back pain is due to disc degeneration. Secondary polycythemia High hemoglobin is noted, and aspirin  is used for stroke prevention. CPAP is not used. Continue aspirin  325 mg daily.  ORDER ASSOCIATIONS  #   DIAGNOSIS / CONDITION ICD-10 ENCOUNTER ORDER     ICD-10-CM   1. Umbilical hernia without obstruction and without gangrene  K42.9 Ambulatory referral to General Surgery    2. Dyslipidemia  E78.5     3. Hypogonadism in male  E29.1 Testosterone     testosterone  cypionate (DEPO-TESTOSTERONE ) 200  MG/ML  injection    4. Hypertension, unspecified type  I10 CBC with Differential/Platelet    Comprehensive metabolic panel with GFR    5. Inguinal hernia without obstruction or gangrene, recurrence not specified, unspecified laterality  K40.90     6. Paroxysmal atrial flutter (HCC)  I48.92 Magnesium     7. Type 1 diabetes mellitus with diabetic polyneuropathy, with long-term current use of insulin  (HCC)  E10.42 HgB A1c    Lipid panel    Microalbumin / creatinine urine ratio    gabapentin  (NEURONTIN ) 300 MG capsule    8. Type 1 diabetes mellitus with diabetic neuropathy, unspecified (HCC)  E10.40     9. History of below-knee amputation of right lower extremity (HCC)  Z89.511 Ambulatory referral to Orthopedic Surgery    10. Encounter for long-term (current) use of insulin  (HCC)  Z79.4     11. Chronic systolic CHF (congestive heart failure) (HCC)  I50.22     12. Encounter for monitoring testosterone  replacement therapy  Z51.81    Z79.890     13. Chronic pain syndrome  G89.4     14. Chronic back pain, unspecified back location, unspecified back pain laterality  M54.9    G89.29     15. Chronic diastolic (congestive) heart failure (HCC)  I50.32     16. Secondary polycythemia  D75.1           Orders Placed in Encounter:   Lab Orders         CBC with Differential/Platelet         Comprehensive metabolic panel with GFR         HgB A1c         Testosterone          Lipid panel         Microalbumin / creatinine urine ratio         Magnesium      Imaging Orders  No imaging studies ordered today   Referral Orders         Ambulatory referral to General Surgery         Ambulatory referral to Orthopedic Surgery    Meds ordered this encounter  Medications   testosterone  cypionate (DEPO-TESTOSTERONE ) 200 MG/ML injection    Sig: Inject 1 mL (200 mg total) into the muscle every 14 (fourteen) days. Significant Risks of heart attack, stroke with continued use. Reducing dose as tolerated  highly recommended    Dispense:  10 mL    Refill:  1    Please dispense as 10 mL bottle   gabapentin  (NEURONTIN ) 300 MG capsule    Sig: Take 1 capsule (300 mg total) by mouth 3 (three) times daily.    Dispense:  270 capsule    Refill:  4        This document was synthesized by artificial intelligence ([employer]bridge) using HIPAA-compliant recording of the clinical interaction;   We discussed the use of AI scribe software for clinical note transcription with the patient, who gave verbal consent to proceed. additional Info: This encounter employed state-of-the-art, real-time, collaborative documentation. The patient actively reviewed and assisted in updating their electronic medical record on a shared screen, ensuring transparency and facilitating joint problem-solving for the problem list, overview, and plan. This approach promotes accurate, informed care. The treatment plan was discussed and reviewed in detail, including medication safety, potential side effects, and all patient questions. We confirmed understanding and comfort with the plan. Follow-up instructions were established, including contacting the office for any concerns, returning  if symptoms worsen, persist, or new symptoms develop, and precautions for potential emergency department visits.

## 2024-04-24 NOTE — Assessment & Plan Note (Signed)
 High risk testosterone  continued after discussion of polycytemia and possible obstructive sleep apnea He has hematology referral tomorrow. Testosterone  levels need checking, and current testosterone  therapy is ongoing. He prefers afternoon appointments for blood tests. Order a testosterone  level test and continue testosterone  cypionate 200 mg intramuscular every 14 days.

## 2024-04-24 NOTE — Assessment & Plan Note (Signed)
 Diabetes is managed with insulin  therapy, using insulin  NPH from [employer], administered 1 cc three to four times daily before meals. Blood glucose monitoring is intermittent, and no continuous glucose monitor is used. Order A1c and microalbumin tests to assess kidney damage and continue the current insulin  regimen.

## 2024-04-24 NOTE — Assessment & Plan Note (Signed)
 High hemoglobin is noted, and aspirin  is used for stroke prevention. CPAP is not used. Continue aspirin  325 mg daily.

## 2024-04-24 NOTE — Assessment & Plan Note (Signed)
 The umbilical hernia requires surgical intervention due to difficulty managing abdominal pressure from bilateral below-knee amputations. The inguinal hernia is present but not prioritized for immediate surgery. He prefers surgery at [facility] and refuses nursing home care post-surgery. Refer to a [facility] for urgent umbilical hernia repair and discuss potential inpatient rehab post-surgery with the surgeon.

## 2024-04-24 NOTE — Assessment & Plan Note (Signed)
 Continue furosemide  20 mg daily and rosuvastatin  40 mg daily.

## 2024-04-24 NOTE — Assessment & Plan Note (Signed)
 Will order lab testing to guide management.  Continue(s) with rosuvastatin 

## 2024-04-24 NOTE — Assessment & Plan Note (Signed)
 Persistent stump pain with exposed nerves prevents prosthetic use. Gabapentin  effectively manages nerve pain, while previous Celebrex  was ineffective. He is interested in advanced prosthetics and clinical trials. Continue gabapentin  300 mg three times daily and refer to [facility] for potential clinical trial enrollment for advanced prosthetics.

## 2024-04-24 NOTE — Telephone Encounter (Signed)
 [name] called from [facility] lab with critical  lab hemoglobin of 18.6 verbally spoke with pcp he is aware . Spoke with pt about labs as well Via phone. Pt states he see the cancer dr tomorrow.

## 2024-04-24 NOTE — Patient Instructions (Signed)
 It was a pleasure seeing you today! Your health and satisfaction are our top priorities.  Bernardino Cone, MD  VISIT SUMMARY: Today, you came in for a follow-up visit to discuss your umbilical hernia and other chronic conditions. We reviewed your current medications, discussed your concerns about surgery and post-surgery care, and planned for necessary lab tests and referrals.  YOUR PLAN: -UMBILICAL AND INGUINAL HERNIAS: You have an umbilical hernia that needs surgical intervention due to the difficulty in managing abdominal pressure from your bilateral below-knee amputations. We will refer you to a surgeon at [facility] for urgent repair. The inguinal hernia is present but not a priority for immediate surgery. We will also discuss potential inpatient rehab post-surgery with the surgeon.  -RIGHT BELOW-KNEE AMPUTATION WITH STUMP PAIN: You are experiencing persistent stump pain with exposed nerves, which prevents you from using a prosthetic. Gabapentin  is helping with nerve pain. We will continue your current dose and refer you to [facility] for potential clinical trial enrollment for advanced prosthetics.  -TYPE 1 DIABETES MELLITUS WITH DIABETIC POLYNEUROPATHY: Your diabetes is managed with insulin  therapy, and you are using insulin  NPH from [employer]. You administer 1 cc three to four times daily before meals. We will order A1c and microalbumin tests to assess your blood sugar control and kidney function. Continue with your current insulin  regimen.  -CHRONIC DIASTOLIC HEART FAILURE: You have chronic diastolic heart failure, which means your heart has difficulty relaxing and filling with blood. Continue taking furosemide  20 mg daily and rosuvastatin  40 mg daily.  -ATRIAL FLUTTER: You have occasional heart flutter. We will check your magnesium  levels to monitor this condition.  -ESSENTIAL HYPERTENSION: You have high blood pressure. Continue taking lisinopril  as prescribed.  -HYPERLIPIDEMIA:  You have high cholesterol levels. We will order a lipid panel to monitor this condition.  -TESTICULAR HYPOFUNCTION: You have low testosterone  levels. We will check your testosterone  levels and continue your current testosterone  therapy of 200 mg intramuscular every 14 days.  -DEGENERATION OF LUMBAR INTERVERTEBRAL DISC WITH BACK PAIN: You have chronic back pain due to disc degeneration. We will continue to monitor this condition.  -SECONDARY POLYCYTHEMIA: You have high hemoglobin levels, which can increase the risk of stroke. Continue taking aspirin  325 mg daily to help prevent blood clots.  INSTRUCTIONS: We will refer you to a surgeon at [facility] for urgent umbilical hernia repair. We will also order the following lab tests: A1c, microalbumin, lipid panel, and testosterone  levels. Please continue with your current medications and follow up with us  after your surgical consultation.  Your Providers PCP: Cone Bernardino MATSU, MD,  [phone]) Referring Provider: Cone Bernardino MATSU, MD,  [phone]) Care Team Provider: Francyne Headland, MD,  [phone]) Care Team Provider: Bonner Ade, MD,  [phone]) Care Team Provider: Cam Morene ORN, MD,  [phone]) Care Team Provider: Marolyn Nest, MD,  [phone]) Care Team Provider: Dareen,  [phone])  NEXT STEPS: [x]  Early Intervention: Schedule sooner appointment, call our on-call services, or go to emergency room if there is any significant Increase in pain or discomfort New or worsening symptoms Sudden or severe changes in your health [x]  Flexible Follow-Up: We recommend a Return in about 3 months (around 07/25/2024). for optimal routine care. This allows for progress monitoring and treatment adjustments. [x]  Preventive Care: Schedule your annual preventive care visit! It's typically covered by insurance and helps identify potential health issues early. [x]  Lab & X-ray Appointments: Incomplete tests  scheduled today, or call to schedule. X-rays: [facility] (M-F, 8:30am-noon or 1pm-5pm). [x]  Medical Information Release: Sign a release form at front desk to obtain relevant medical information we don't have.  MAKING THE MOST OF OUR FOCUSED 20 MINUTE APPOINTMENTS: [x]   Clearly state your top concerns at the beginning of the visit to focus our discussion [x]   If you anticipate you will need more time, please inform the front desk during scheduling - we can book multiple appointments in the same week. [x]   If you have transportation problems- use our convenient video appointments or ask about transportation support. [x]   We can get down to business faster if you use MyChart to update information before the visit and submit non-urgent questions before your visit. Thank you for taking the time to provide details through MyChart.  Let our nurse know and she can import this information into your encounter documents.  Arrival and Wait Times: [x]   Arriving on time ensures that everyone receives prompt attention. [x]   Early morning (8a) and afternoon (1p) appointments tend to have shortest wait times. [x]   Unfortunately, we cannot delay appointments for late arrivals or hold slots during phone calls.  Getting Answers and Following Up [x]   Simple Questions & Concerns: For quick questions or basic follow-up after your visit, reach us  at [phone] or MyChart messaging. [x]   Complex Concerns: If your concern is more complex, scheduling an appointment might be best. Discuss this with the staff to find the most suitable option. [x]   Lab & Imaging Results: We'll contact you directly if results are abnormal or you don't use MyChart. Most normal results will be on MyChart within 2-3 business days, with a review message from Dr. Jesus. Haven't heard back in 2 weeks? Need results sooner? Contact us  at [phone]. [x]   Referrals: Our referral coordinator will manage specialist referrals.  The specialist's office should contact you within 2 weeks to schedule an appointment. Call us  if you haven't heard from them after 2 weeks.  Staying Connected [x]   MyChart: Activate your MyChart for the fastest way to access results and message us . See the last page of this paperwork for instructions on how to activate.  Bring to Your Next Appointment [x]   Medications: Please bring all your medication bottles to your next appointment to ensure we have an accurate record of your prescriptions. [x]   Health Diaries: If you're monitoring any health conditions at home, keeping a diary of your readings can be very helpful for discussions at your next appointment.  Billing [x]   X-ray & Lab Orders: These are billed by separate companies. Contact the invoicing company directly for questions or concerns. [x]   Visit Charges: Discuss any billing inquiries with our administrative services team.  Your Satisfaction Matters [x]   Share Your Experience: We strive for your satisfaction! If you have any complaints, or preferably compliments, please let Dr. Jesus know directly or contact our Practice Administrators, Manuelita Rubin or [employer], by asking at the front desk.   Reviewing Your Records [x]   Review this early draft of your clinical encounter notes below and the final encounter summary tomorrow on MyChart after its been completed.  All orders placed so far are visible here: Umbilical hernia without obstruction and without gangrene -     Ambulatory referral to General Surgery  Dyslipidemia  Hypogonadism in male -     Testosterone  -     Testosterone  Cypionate; Inject 1 mL (200 mg total) into the muscle every 14 (fourteen) days. Significant Risks of heart attack, stroke  with continued use. Reducing dose as tolerated highly recommended  Dispense: 10 mL; Refill: 1  Hypertension, unspecified type -     CBC with Differential/Platelet -     Comprehensive metabolic panel with GFR  Inguinal hernia  without obstruction or gangrene, recurrence not specified, unspecified laterality  Paroxysmal atrial flutter (HCC) -     Magnesium   Type 1 diabetes mellitus with diabetic polyneuropathy, with long-term current use of insulin  (HCC) -     Hemoglobin A1c -     Lipid panel -     Microalbumin / creatinine urine ratio -     Gabapentin ; Take 1 capsule (300 mg total) by mouth 3 (three) times daily.  Dispense: 270 capsule; Refill: 4  Type 1 diabetes mellitus with diabetic neuropathy, unspecified (HCC)  History of below-knee amputation of right lower extremity (HCC) -     Ambulatory referral to Orthopedic Surgery  Encounter for long-term (current) use of insulin  (HCC)  Chronic systolic CHF (congestive heart failure) (HCC)  Encounter for monitoring testosterone  replacement therapy  Chronic pain syndrome  Chronic back pain, unspecified back location, unspecified back pain laterality  Chronic diastolic (congestive) heart failure (HCC)  Secondary polycythemia

## 2024-04-24 NOTE — Assessment & Plan Note (Signed)
 BP Readings from Last 30 Encounters:  04/24/24 138/86  02/16/24 (!) 150/88  12/29/23 134/82  12/01/23 117/80  10/26/23 110/88  10/18/23 (!) 146/92  09/20/23 112/76  08/11/23 (!) 151/86  07/28/23 (!) 158/98  07/27/23 122/81  06/24/23 (!) 146/100  12/23/22 (!) 140/90  07/24/22 138/82  06/22/22 138/66  06/15/22 134/84  04/23/22 130/78  04/14/22 126/68  04/09/22 (!) 172/98  12/31/21 110/67  12/26/21 (!) 155/85  12/18/21 (!) 144/88  12/05/21 (!) 150/117  10/02/21 139/75  09/19/21 (!) 119/56  09/09/21 (!) 160/89  05/23/21 113/62  05/16/21 126/78  04/24/21 (!) 170/62  05/29/20 (!) 170/112  04/05/20 124/74   continues on, as of 04/24/2024 Current hypertension medications:       Sig   furosemide  (LASIX ) 20 MG tablet TAKE 1 TABLET BY MOUTH ONCE DAILY . APPOINTMENT REQUIRED FOR FUTURE REFILLS   lisinopril  (ZESTRIL ) 10 MG tablet TAKE 1 TABLET BY MOUTH ONCE DAILY KEEP  JAN  OFFICE  VISIT   sildenafil  (VIAGRA ) 100 MG tablet Take 100 mg by mouth daily as needed for erectile dysfunction.      Borderline controlled.

## 2024-04-24 NOTE — Assessment & Plan Note (Signed)
 Occasional heart flutter is noted. Check magnesium  levels due to arrhythmia.

## 2024-04-25 ENCOUNTER — Other Ambulatory Visit: Payer: Self-pay

## 2024-04-25 ENCOUNTER — Encounter: Payer: Self-pay | Admitting: Oncology

## 2024-04-25 ENCOUNTER — Inpatient Hospital Stay: Payer: Medicare HMO | Admitting: Oncology

## 2024-04-25 ENCOUNTER — Inpatient Hospital Stay: Payer: Medicare HMO | Attending: Oncology

## 2024-04-25 ENCOUNTER — Ambulatory Visit: Payer: Self-pay | Admitting: Internal Medicine

## 2024-04-25 DIAGNOSIS — Z79890 Hormone replacement therapy (postmenopausal): Secondary | ICD-10-CM | POA: Insufficient documentation

## 2024-04-25 DIAGNOSIS — D751 Secondary polycythemia: Secondary | ICD-10-CM

## 2024-04-25 DIAGNOSIS — E291 Testicular hypofunction: Secondary | ICD-10-CM | POA: Insufficient documentation

## 2024-04-25 DIAGNOSIS — Z89511 Acquired absence of right leg below knee: Secondary | ICD-10-CM | POA: Insufficient documentation

## 2024-04-25 DIAGNOSIS — N2889 Other specified disorders of kidney and ureter: Secondary | ICD-10-CM

## 2024-04-25 DIAGNOSIS — Z720 Tobacco use: Secondary | ICD-10-CM | POA: Insufficient documentation

## 2024-04-25 DIAGNOSIS — L89159 Pressure ulcer of sacral region, unspecified stage: Secondary | ICD-10-CM | POA: Insufficient documentation

## 2024-04-25 DIAGNOSIS — N289 Disorder of kidney and ureter, unspecified: Secondary | ICD-10-CM | POA: Diagnosis not present

## 2024-04-25 DIAGNOSIS — Z89512 Acquired absence of left leg below knee: Secondary | ICD-10-CM | POA: Insufficient documentation

## 2024-04-25 NOTE — Assessment & Plan Note (Addendum)
 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 Concern for RCC but patient previously declined surgery. Bosniak 3 previously, currently Bosniak 4. -He recently established with IR and and underwent ablation of the kidney lesion in June 2025.  He does have follow-up scheduled with IR with restaging imaging. -Follows with Dr. Cam -Continue close monitoring with urology.

## 2024-04-25 NOTE — Progress Notes (Signed)
 Reviewed. Offered ENT for suspected obstructive sleep apnea, and tirzepatide.

## 2024-04-25 NOTE — Assessment & Plan Note (Signed)
 Long-term use of testosterone injections, currently 1cc every two weeks. Previously was on a higher dose of 3cc per week. -Patient continues testosterone cypionate despite risks, as discussed by his PCP.  Dr. Jon Billings is planning on cutting down on testosterone dose. -PSA monitoring as per urology.

## 2024-04-25 NOTE — Progress Notes (Signed)
 [facility]  HEMATOLOGY CLINIC PROGRESS NOTE  PATIENT NAME: [name]   MR#: [id number] DOB: 09/23/1965  Patient Care Team: Jesus Bernardino MATSU, MD as PCP - General (Internal Medicine) Francyne Headland, MD as PCP - Cardiology (Cardiology) Bonner Ade, MD as Consulting Physician (Physical Medicine and Rehabilitation) Cam Morene ORN, MD as Attending Physician (Urology) Marolyn Nest, MD as Consulting Physician (General Surgery) [facility] (Orthopedic Surgery)  Date of visit: 04/25/2024   ASSESSMENT & PLAN:   [name] is a 58 y.o. gentleman with past medical history of insulin -dependent diabetes mellitus, with diabetic neuropathy, congestive heart failure, male hypogonadism (on testosterone  supplementation), hypertension, kidney stones, tumor on left kidney being monitored by urologist, history of bilateral BKA, was referred to our clinic in November 2024 for evaluation of polycythemia.  Secondary polycythemia presumed to be related to testosterone  supplementation and also a kidney tumor.  JAK2 testing, CALR, MPL mutations negative in November 2024.  Secondary polycythemia -Elevated hemoglobin and hematocrit noted on multiple occasions since 2023 at least. Discussed potential causes including primary polycythemia/MPN, secondary causes including stressors such as smoking, sleep apnea, certain medications, and low oxygen environments. Patient has a history of long-term testosterone  therapy which could contribute. Also noted a tumor on the left kidney which could potentially produce erythropoietin , stimulating red cell production.   On his initial consultation with us  on 07/27/2023, labs showed hematocrit of 55, hemoglobin 18.5.  White count normal with normal differential.  Platelet count normal.  CMP grossly unremarkable.  LDH within normal limits.  Erythropoietin  level was increased at 40.7 mIU/mL.  JAK2 testing, CALR, MPL mutations, extended panel was  negative.   He has history of tumor on left kidney.  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  He underwent ablative procedure by IR in June 2025.   Most likely etiology for secondary polycythemia is his testosterone  supplementation, although patient does not believe that this could be the case for him since he has been on testosterone  injections for more than 10 years.  Polycythemia could also be contributed by kidney tumor as noted by elevated erythropoietin  level.   We will consider therapeutic phlebotomy if hematocrit remains above 55, to avoid complications from hyperviscosity including blood clots, cerebrovascular or cardiovascular events.  Reviewed labs from 04/24/2024.  Hematocrit 55.6.  Discussed need for therapeutic phlebotomy to avoid hyperviscosity complications and patient is agreeable.  Will arrange monitor for therapeutic phlebotomy later this week.  RTC in 4 weeks for labs and possible therapeutic phlebotomy if hematocrit is above 55.  Otherwise I will plan to see him again in 6 months for follow-up with repeat labs.  Renal mass Tumor on left kidney, currently being monitored by urology. Concern for RCC but patient previously declined surgery. Bosniak 3 previously, currently Bosniak 4. -He recently established with IR and and underwent ablation of the kidney lesion in June 2025.  He does have follow-up scheduled with IR with restaging imaging. -Follows with Dr. Cam -Continue close monitoring with urology.  Hypogonadism in male Long-term use of testosterone  injections, currently 1cc every two weeks. Previously was on a higher dose of 3cc per week. -Patient continues testosterone  cypionate despite risks, as discussed by his PCP.  Dr. Jesus is planning on cutting down on testosterone  dose. -PSA monitoring as per urology.   I spent a total of 25 minutes during this encounter with the patient including review of chart and various tests results,  discussions about plan of care  and coordination of care plan.  I reviewed lab results and outside records for this visit and discussed relevant results with the patient. Diagnosis, plan of care and treatment options were also discussed in detail with the patient. Opportunity provided to ask questions and answers provided to his apparent satisfaction. Provided instructions to call our clinic with any problems, questions or concerns prior to return visit. I recommended to continue follow-up with PCP and sub-specialists. He verbalized understanding and agreed with the plan. No barriers to learning was detected.  Chinita Patten, MD  04/25/2024 4:24 PM  [facility] A DEPT OF [facility] [address] [place] [address] Dept: [phone] Dept Fax: [phone]   CHIEF COMPLAINT/ REASON FOR VISIT:  Follow-up for secondary polycythemia, presumed to be related to testosterone  supplementation.  JAK2 testing, CALR, MPL mutations negative in November 2024.  INTERVAL HISTORY:  Discussed the use of AI scribe software for clinical note transcription with the patient, who gave verbal consent to proceed.  History of Present Illness [name] is a 58 year old male with low testosterone  and elevated hemoglobin who presents for follow-up regarding his testosterone  therapy and hemoglobin levels.  He has been receiving testosterone  injections of 1 cc every two weeks due to low testosterone  levels. Despite this treatment, his testosterone  levels remain low as of the most recent check.  His hemoglobin levels have been fluctuating. Last year, his hemoglobin was as high as 56.9 in October. In February, it was 51.5, and as of yesterday, it was 55.6. He has not undergone any phlebotomies in the past.  No symptoms such as headaches, vision problems, chest congestion, or trouble breathing. He mentions experiencing issues with his leg but  does not specify further.  He is in a wheelchair and expresses frustration about his inability to walk or perform certain activities, stating 'I can't do nothing.' He mentions that he drinks a lot of water.    SUMMARY OF HEMATOLOGIC HISTORY:  On 06/24/2023, labs at his PCPs office showed hemoglobin of 18.8, hematocrit of 56.9.  White count and platelet count were within normal limits.  Previously labs in April 2024 showed hemoglobin of 19.4, hematocrit of 56.6.  Given persistent erythrocytosis, referral was sent to us  for further evaluation.   The patient's hemoglobin has been slightly high since 2023, with a notable increase in 2024. The patient denies any history of blood clots, chest pain, trouble breathing, dizziness, or lightheadedness. The patient has not been diagnosed with sleep apnea. The patient chews tobacco but does not smoke. The patient also has a history of a stage 4 bed sore, which took 18 months to heal and still causes discomfort. The patient is scheduled for an MRI for a suspected issue with the right leg.  Discussed potential causes including primary polycythemia/MPN, secondary causes including stressors such as smoking, sleep apnea, certain medications, and low oxygen environments. Patient has a history of long-term testosterone  therapy which could contribute. Also noted a tumor on the left kidney which could potentially produce erythropoietin , stimulating red cell production.   On his initial consultation with us  on 07/27/2023, labs showed hematocrit of 55, hemoglobin 18.5.  White count normal with normal differential.  Platelet count normal.  CMP grossly unremarkable.  LDH within normal limits.  Erythropoietin  level was increased at 40.7 mIU/mL.  JAK2 testing, CALR, MPL mutations, extended panel was negative.   He has history of tumor on left kidney.  Previously it  was Bosniak 3 but recently it was noted to be Bosniak 4.  He is scheduled for ablative procedure by IR in March  2025.   Most likely etiology for secondary polycythemia is his testosterone  supplementation, although patient does not believe that this could be the case for him since he has been on testosterone  injections for more than 10 years.  Polycythemia could also be contributed by kidney tumor as noted by elevated erythropoietin  level.   We will consider therapeutic phlebotomy if hematocrit remains above 55, to avoid complications from hyperviscosity including blood clots, cerebrovascular or cardiovascular events.  Patient states that he has pressure ulcer on his sacral area and does not want to come for phlebotomies, unless absolutely necessary.  Most recent hematocrit levels have been below 55, thus avoiding the need for therapeutic phlebotomies.  On 04/24/2024, hematocrit was 55.6.  Will proceed with 1 unit of therapeutic phlebotomy.  I have reviewed the past medical history, past surgical history, social history and family history with the patient and they are unchanged from previous note.  ALLERGIES: He is allergic to bactrim [sulfamethoxazole-trimethoprim], ciprofloxacin , and levaquin [levofloxacin].  MEDICATIONS:  Current Outpatient Medications  Medication Sig Dispense Refill   ACCU-CHEK GUIDE test strip USE UP TO SIX TIMES DAILY TO CHECK BLOOD SUGARS 200 each 0   aspirin  EC 325 MG tablet Take 1 tablet (325 mg total) by mouth daily. 90 tablet 3   blood glucose meter kit and supplies KIT Dispense based on patient and insurance preference. Use up to four times daily as directed. 1 each 0   furosemide  (LASIX ) 20 MG tablet TAKE 1 TABLET BY MOUTH ONCE DAILY . APPOINTMENT REQUIRED FOR FUTURE REFILLS 90 tablet 3   gabapentin  (NEURONTIN ) 300 MG capsule Take 1 capsule (300 mg total) by mouth 3 (three) times daily. 270 capsule 4   insulin  NPH-regular Human (NOVOLIN  70/30 RELION) (70-30) 100 UNIT/ML injection Inject 15 Units into the skin 2 (two) times daily with a meal. 10 mL 11   insulin  regular  (NOVOLIN  R) 100 units/mL injection      Lancets (ONETOUCH DELICA PLUS LANCET33G) MISC Apply 1 each topically 3 (three) times daily.     lisinopril  (ZESTRIL ) 10 MG tablet TAKE 1 TABLET BY MOUTH ONCE DAILY KEEP  JAN  OFFICE  VISIT 90 tablet 3   Oxycodone  HCl 10 MG TABS Take 10 mg by mouth 5 (five) times daily.     pentoxifylline  (TRENTAL ) 400 MG CR tablet Take 1 tablet (400 mg total) by mouth 3 (three) times daily with meals. 90 tablet 3   rosuvastatin  (CRESTOR ) 20 MG tablet Take 1 tablet by mouth once daily 90 tablet 3   sildenafil  (VIAGRA ) 100 MG tablet Take 100 mg by mouth daily as needed for erectile dysfunction.     tamsulosin  (FLOMAX ) 0.4 MG CAPS capsule Take 0.4 mg by mouth daily.     testosterone  cypionate (DEPO-TESTOSTERONE ) 200 MG/ML injection Inject 1 mL (200 mg total) into the muscle every 14 (fourteen) days. Significant Risks of heart attack, stroke with continued use. Reducing dose as tolerated highly recommended 10 mL 1   No current facility-administered medications for this visit.     REVIEW OF SYSTEMS:    ROS  All other pertinent systems were reviewed with the patient and are negative.  PHYSICAL EXAMINATION:    Onc Performance Status - 04/25/24 1510       ECOG Perf Status   ECOG Perf Status Ambulatory and capable of all selfcare but unable to  carry out any work activities.  Up and about more than 50% of waking hours      KPS SCALE   KPS % SCORE Requires occasional assistance but is able to care for most needs           There were no vitals filed for this visit.  There were no vitals filed for this visit.  Physical Exam Constitutional:      General: He is not in acute distress.    Comments: Presented to clinic in a wheelchair  HENT:     Head: Normocephalic and atraumatic.  Eyes:     General: No scleral icterus.    Conjunctiva/sclera: Conjunctivae normal.  Cardiovascular:     Rate and Rhythm: Normal rate and regular rhythm.  Pulmonary:     Effort:  Pulmonary effort is normal.  Musculoskeletal:     Comments: Status post bilateral BKA  Neurological:     Mental Status: He is alert and oriented to person, place, and time.     LABORATORY DATA:   I have reviewed the data as listed.   Results for orders placed or performed in visit on 04/24/24 (from the past 72 hours)  CBC with Differential/Platelet     Status: Abnormal   Collection Time: 04/24/24  2:04 PM  Result Value Ref Range   WBC 5.3 4.0 - 10.5 K/uL   RBC 6.20 (H) 4.22 - 5.81 Mil/uL   Hemoglobin 18.6 Repeated and verified X2. (HH) 13.0 - 17.0 g/dL   HCT 44.3 Repeated and verified X2. (H) 39.0 - 52.0 %   MCV 89.7 78.0 - 100.0 fl   MCHC 33.5 30.0 - 36.0 g/dL   RDW 85.4 88.4 - 84.4 %   Platelets 194.0 150.0 - 400.0 K/uL   Neutrophils Relative % 74.8 43.0 - 77.0 %   Lymphocytes Relative 12.2 12.0 - 46.0 %   Monocytes Relative 10.4 3.0 - 12.0 %   Eosinophils Relative 2.1 0.0 - 5.0 %   Basophils Relative 0.5 0.0 - 3.0 %   Neutro Abs 4.0 1.4 - 7.7 K/uL   Lymphs Abs 0.6 (L) 0.7 - 4.0 K/uL   Monocytes Absolute 0.6 0.1 - 1.0 K/uL   Eosinophils Absolute 0.1 0.0 - 0.7 K/uL   Basophils Absolute 0.0 0.0 - 0.1 K/uL  Comprehensive metabolic panel with GFR     Status: None   Collection Time: 04/24/24  2:04 PM  Result Value Ref Range   Sodium 139 135 - 145 mEq/L   Potassium 3.9 3.5 - 5.1 mEq/L   Chloride 103 96 - 112 mEq/L   CO2 28 19 - 32 mEq/L   Glucose, Bld 83 70 - 99 mg/dL   BUN 18 6 - 23 mg/dL   Creatinine, Ser 8.74 0.40 - 1.50 mg/dL   Total Bilirubin 0.6 0.2 - 1.2 mg/dL   Alkaline Phosphatase 90 39 - 117 U/L   AST 29 0 - 37 U/L   ALT 33 0 - 53 U/L   Total Protein 6.9 6.0 - 8.3 g/dL   Albumin 4.0 3.5 - 5.2 g/dL   GFR 36.41 >39.99 mL/min    Comment: Calculated using the CKD-EPI Creatinine Equation (2021)   Calcium  9.3 8.4 - 10.5 mg/dL  HgB J8r     Status: Abnormal   Collection Time: 04/24/24  2:04 PM  Result Value Ref Range   Hgb A1c MFr Bld 7.8 (H) 4.6 - 6.5 %     Comment: Glycemic Control Guidelines for People with Diabetes:Non Diabetic:  <  6%Goal of Therapy: <7%Additional Action Suggested:  >8%   Testosterone      Status: Abnormal   Collection Time: 04/24/24  2:04 PM  Result Value Ref Range   Testosterone  119.45 (L) 300.00 - 890.00 ng/dL  Lipid panel     Status: Abnormal   Collection Time: 04/24/24  2:04 PM  Result Value Ref Range   Cholesterol 106 0 - 200 mg/dL    Comment: ATP III Classification       Desirable:  < 200 mg/dL               Borderline High:  200 - 239 mg/dL          High:  > = 759 mg/dL   Triglycerides 10.9 0.0 - 149.0 mg/dL    Comment: Normal:  <849 mg/dLBorderline High:  150 - 199 mg/dL   HDL 63.59 (L) >60.99 mg/dL   VLDL 82.1 0.0 - 59.9 mg/dL   LDL Cholesterol 52 0 - 99 mg/dL   Total CHOL/HDL Ratio 3     Comment:                Men          Women1/2 Average Risk     3.4          3.3Average Risk          5.0          4.42X Average Risk          9.6          7.13X Average Risk          15.0          11.0                       NonHDL 69.54     Comment: NOTE:  Non-HDL goal should be 30 mg/dL higher than patient's LDL goal (i.e. LDL goal of < 70 mg/dL, would have non-HDL goal of < 100 mg/dL)  Magnesium      Status: None   Collection Time: 04/24/24  2:04 PM  Result Value Ref Range   Magnesium  1.8 1.5 - 2.5 mg/dL     RADIOGRAPHIC STUDIES:  No recent pertinent imaging studies available to review.  Orders Placed This Encounter  Procedures   Hemoglobin and Hematocrit (Cancer Center Only)    Standing Status:   Future    Expected Date:   05/26/2024    Expiration Date:   04/25/2025   CBC with Differential (Cancer Center Only)    Standing Status:   Future    Expected Date:   10/26/2024    Expiration Date:   01/24/2025   CMP (Cancer Center only)    Standing Status:   Future    Expected Date:   10/26/2024    Expiration Date:   01/24/2025     Future Appointments  Date Time Provider Department Center  04/27/2024  1:00 PM Gerome Maurilio HERO, PA-C [facility] None  04/28/2024 12:45 PM [facility] INFUSION [facility] None  05/26/2024  1:00 PM [facility] PHLEBOTOMIST [facility] None  05/26/2024  1:30 PM [facility] INFUSION [facility] None  07/26/2024  1:20 PM Jesus Bernardino MATSU, MD [facility]  10/24/2024  2:00 PM [facility] PHLEBOTOMIST [facility] None  10/24/2024  2:30 PM Kadrian Partch, Chinita, MD [facility] None  10/24/2024  3:00 PM [facility] INFUSION [facility] None  04/02/2025  1:40 PM [facility] ANNUAL WELLNESS VISIT 1 [facility]     This document was completed utilizing  speech recognition software. Grammatical errors, random word insertions, pronoun errors, and incomplete sentences are an occasional consequence of this system due to software limitations, ambient noise, and hardware issues. Any formal questions or concerns about the content, text or information contained within the body of this dictation should be directly addressed to the provider for clarification.

## 2024-04-25 NOTE — Assessment & Plan Note (Addendum)
-  Elevated hemoglobin and hematocrit noted on multiple occasions since 2023 at least. Discussed potential causes including primary polycythemia/MPN, secondary causes including stressors such as smoking, sleep apnea, certain medications, and low oxygen environments. Patient has a history of long-term testosterone  therapy which could contribute. Also noted a tumor on the left kidney which could potentially produce erythropoietin , stimulating red cell production.   On his initial consultation with us  on 07/27/2023, labs showed hematocrit of 55, hemoglobin 18.5.  White count normal with normal differential.  Platelet count normal.  CMP grossly unremarkable.  LDH within normal limits.  Erythropoietin  level was increased at 40.7 mIU/mL.  JAK2 testing, CALR, MPL mutations, extended panel was negative.   He has history of tumor on left kidney.  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  He underwent ablative procedure by IR in June 2025.   Most likely etiology for secondary polycythemia is his testosterone  supplementation, although patient does not believe that this could be the case for him since he has been on testosterone  injections for more than 10 years.  Polycythemia could also be contributed by kidney tumor as noted by elevated erythropoietin  level.   We will consider therapeutic phlebotomy if hematocrit remains above 55, to avoid complications from hyperviscosity including blood clots, cerebrovascular or cardiovascular events.  Reviewed labs from 04/24/2024.  Hematocrit 55.6.  Discussed need for therapeutic phlebotomy to avoid hyperviscosity complications and patient is agreeable.  Will arrange monitor for therapeutic phlebotomy later this week.  RTC in 4 weeks for labs and possible therapeutic phlebotomy if hematocrit is above 55.  Otherwise I will plan to see him again in 6 months for follow-up with repeat labs.

## 2024-04-27 ENCOUNTER — Encounter: Payer: Self-pay | Admitting: Physician Assistant

## 2024-04-27 ENCOUNTER — Ambulatory Visit: Admitting: Physician Assistant

## 2024-04-27 ENCOUNTER — Telehealth: Payer: Self-pay | Admitting: Internal Medicine

## 2024-04-27 DIAGNOSIS — M79661 Pain in right lower leg: Secondary | ICD-10-CM

## 2024-04-27 NOTE — Telephone Encounter (Unsigned)
 Copied from CRM [id number]. Topic: Referral - Status >> Apr 27, 2024  3:29 PM Jayma L wrote: Reason for CRM: joyce from [facility] called and stated they will ignore the referral - if pcp wants patient to be seen at that location please callback [phone]

## 2024-04-27 NOTE — Progress Notes (Signed)
 Office Visit Note   Patient: [name]           Date of Birth: 1966-07-15           MRN: [id number] Visit Date: 04/27/2024              Requested by: Jesus Bernardino MATSU, MD [address] [place],  [place] [address] PCP: Jesus Bernardino MATSU, MD  Chief Complaint  Patient presents with   Right Leg - Follow-up    Hx BKA       HPI: Patient is a 58 year old gentleman status post bilateral transtibial amputation. Patient is able to wear his prosthesis on the left but cannot wear it on the right due to the increased swelling. Patient is on Neurontin .  He states he has had the same shrinker sock for a while and thinks he needs another one.     Assessment & Plan: Visit Diagnoses: No diagnosis found.  Plan: He will massage the right stump with rubbing alcohol daily.  He does not want to go to the prosthetics place and will order a new stump shrinker on line.    Follow-Up Instructions: No follow-ups on file.   Ortho Exam  Patient is alert, oriented, no adenopathy, well-dressed, normal affect, normal respiratory effort. His skin color is good with dependent bluish discoloration at the distal stump.  NTTP over the stump except the lateral area that has slight electric pain to palpation.  There is no edema or open wounds.  He has full ROM of the knee.  Currently no signs or symptoms of complex regional pain syndrome.     Imaging: No results found. No images are attached to the encounter.  Labs: Lab Results  Component Value Date   HGBA1C 7.8 (H) 04/24/2024   HGBA1C 8.1 (H) 06/24/2023   HGBA1C 7.1 (H) 12/23/2022   ESRSEDRATE 25 (H) 07/03/2022   ESRSEDRATE 11 12/30/2021   ESRSEDRATE 12 12/29/2021   CRP <1.0 07/03/2022   CRP 0.7 12/30/2021   CRP 1.1 (H) 12/29/2021   REPTSTATUS 04/11/2022 FINAL 04/09/2022   CULT >=100,000 COLONIES/mL PSEUDOMONAS AERUGINOSA (A) 04/09/2022   LABORGA PSEUDOMONAS AERUGINOSA (A) 04/09/2022     Lab Results  Component Value Date   ALBUMIN 4.0  04/24/2024   ALBUMIN 3.7 10/26/2023   ALBUMIN 4.0 07/27/2023    Lab Results  Component Value Date   MG 1.8 04/24/2024   MG 1.7 12/18/2021   MG 1.4 (L) 12/16/2021   No results found for: VD25OH  No results found for: PREALBUMIN    Latest Ref Rng & Units 04/24/2024    2:04 PM 12/01/2023    7:08 AM 10/26/2023    2:55 PM  CBC EXTENDED  WBC 4.0 - 10.5 K/uL 5.3  7.4  6.4   RBC 4.22 - 5.81 Mil/uL 6.20  5.69  5.76   Hemoglobin 13.0 - 17.0 g/dL 81.3 Repeated and verified X2.  17.5  17.9   HCT 39.0 - 52.0 % 55.6 Repeated and verified X2.  53.1  51.5   Platelets 150.0 - 400.0 K/uL 194.0  202  191   NEUT# 1.4 - 7.7 K/uL 4.0  5.7  5.2   Lymph# 0.7 - 4.0 K/uL 0.6  0.8  0.5      There is no height or weight on file to calculate BMI.  Orders:  No orders of the defined types were placed in this encounter.  No orders of the defined types were placed in this encounter.  Procedures: No procedures performed  Clinical Data: No additional findings.  ROS:  All other systems negative, except as noted in the HPI. Review of Systems  Objective: Vital Signs: There were no vitals taken for this visit.  Specialty Comments:  No specialty comments available.  PMFS History: Patient Active Problem List   Diagnosis Date Noted   Inguinal hernia without obstruction or gangrene 02/17/2024   History of decubitus ulcer 02/17/2024   History of tobacco use 02/17/2024   Complex regional pain syndrome type 2 of right lower extremity 02/16/2024   Umbilical hernia without obstruction and without gangrene 02/16/2024   Adverse reaction to intramuscular testosterone  08/11/2023   History of atrial fibrillation 08/11/2023   Shoulder arthritis 07/28/2023   Amputation stump pain (HCC) 07/28/2023   Chronic back pain 12/24/2022   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HCC) 07/24/2022   Malaise 06/22/2022   Secondary polycythemia 06/22/2022   Amputation below knee (HCC) 04/23/2022   PTSD (post-traumatic stress  disorder) 04/23/2022   Generalized anxiety disorder 01/21/2022   Episode of unresponsiveness 12/15/2021   Renal mass 12/15/2021   Insomnia 09/14/2021   Chronic systolic CHF (congestive heart failure) (HCC) 09/14/2021   Hypogonadism in male 09/14/2021   Chronic pain 09/14/2021   Pseudohyponatremia 09/13/2021   Normocytic anemia 09/13/2021   Discitis of thoracic region    Paroxysmal atrial flutter (HCC) 07/22/2021   Chronic diastolic (congestive) heart failure (HCC) 05/16/2021   Lumbar spondylosis 03/25/2021   H/O amputation of leg through tibia and fibula (HCC) 11/10/2019   Degeneration of lumbar intervertebral disc 11/10/2019   Hypercholesterolemia 11/10/2019   Status post bilateral below knee amputation (HCC) 10/30/2019   Dyslipidemia 10/30/2019   Type 1 diabetes mellitus with diabetic polyneuropathy, with long-term current use of insulin  (HCC) 10/12/2019   Hypertensive disorder 09/25/2019   Urinary hesitancy 09/25/2019   Prosthesis adjustments 09/25/2019   Erectile dysfunction 09/25/2019   Palliative care status 09/25/2019   Pulmonary nodules 09/25/2019   Depressive disorder 09/25/2019   Past Medical History:  Diagnosis Date   Acquired complex renal cyst 09/25/2019   Acute respiratory failure with hypoxia (HCC) secondary to suspected aspiration pneumonia 12/15/2021   AKI (acute kidney injury) (HCC) 07/22/2021   Cancer (HCC)    renal   Chest pain 07/22/2021   CHF (congestive heart failure) (HCC)    Complication of anesthesia    last time came out angry   Decubitus ulcer of sacral area 12/15/2021   Depression    Diabetes mellitus without complication (HCC)    Diabetic infection of left foot (HCC) 04/17/2021   Episode of unresponsiveness 12/15/2021   Gas gangrene of foot (HCC) 07/22/2021   Healthcare maintenance 09/25/2019   Hematuria 09/25/2019   History of kidney stones    Hypertension    Lactic acidosis 12/15/2021   Leukocytosis 09/13/2021   Osteomyelitis (HCC)     PTSD (post-traumatic stress disorder)    Sacral pressure injury of skin 07/27/2021   Septic shock (HCC) 07/22/2021   Severe sepsis with acute organ dysfunction (HCC) 07/22/2021   Shortness of breath 05/16/2021   Stage 3a chronic kidney disease (HCC) 09/25/2019   Stage 4 skin ulcer of sacral region (HCC) 12/29/2021   Stage IV pressure ulcer of sacral region (HCC) 09/15/2021   -Noted to be infected during previous hospitalization on 08/14/2021 and was treated with a total of 7 days of cefepime , he was receiving Hydro therapy.  -Wound care and general surgery consulted, no need for surgical debridement as wound appears fairly good.  Continue local care per wound with twice daily saline dressing and collagenase .  Continue IV Dilaudid  with wound manipulation since it is ext   Systolic dysfunction    Type 2 diabetes mellitus 10/12/2019    Family History  Problem Relation Age of Onset   Anxiety disorder Mother    Cancer Father    Coronary artery disease Father    Sleep apnea Neg Hx     Past Surgical History:  Procedure Laterality Date   AMPUTATION Left 07/23/2021   Procedure: LEFT BELOW KNEE AMPUTATION;  Surgeon: Harden Jerona GAILS, MD;  Location: [facility] OR;  Service: Orthopedics;  Laterality: Left;   BELOW KNEE LEG AMPUTATION Right    BUBBLE STUDY  07/29/2021   Procedure: BUBBLE STUDY;  Surgeon: Shlomo Wilbert SAUNDERS, MD;  Location: [facility] ENDOSCOPY;  Service: Cardiovascular;;   CARDIOVERSION N/A 07/29/2021   Procedure: CARDIOVERSION;  Surgeon: Shlomo Wilbert SAUNDERS, MD;  Location: [facility] ENDOSCOPY;  Service: Cardiovascular;  Laterality: N/A;   IR RADIOLOGIST EVAL & MGMT  10/18/2023   IR RADIOLOGIST EVAL & MGMT  02/25/2024   RADIOLOGY WITH ANESTHESIA N/A 08/05/2021   Procedure: MRI LUMBAR WITH AND WITHOUT; THORASIC SPINE WITH AND WITHOUT WITH ANESTHESIA;  Surgeon: Radiologist, Medication, MD;  Location: [facility] OR;  Service: Radiology;  Laterality: N/A;   RADIOLOGY WITH ANESTHESIA N/A 08/07/2021   Procedure: MRI WITH  LUMBER WITH AND WITHOUT CONTRAST,THORACIC WITH AND WITHOUT CONTRAST;  Surgeon: Radiologist, Medication, MD;  Location: [facility] OR;  Service: Radiology;  Laterality: N/A;   RADIOLOGY WITH ANESTHESIA N/A 09/13/2021   Procedure: MRI WITH ANESTHESIA;  Surgeon: Dolphus Carrion, MD;  Location: [facility] OR;  Service: Radiology;  Laterality: N/A;   TEE WITHOUT CARDIOVERSION N/A 07/29/2021   Procedure: TRANSESOPHAGEAL ECHOCARDIOGRAM (TEE);  Surgeon: Shlomo Wilbert SAUNDERS, MD;  Location: [facility] ENDOSCOPY;  Service: Cardiovascular;  Laterality: N/A;   Social History   Occupational History   Not on file  Tobacco Use   Smoking status: Never   Smokeless tobacco: Former    Types: Engineer, drilling   Vaping status: Never Used  Substance and Sexual Activity   Alcohol use: Not Currently   Drug use: Never   Sexual activity: Not Currently

## 2024-04-28 ENCOUNTER — Encounter: Payer: Self-pay | Admitting: Oncology

## 2024-04-28 ENCOUNTER — Inpatient Hospital Stay

## 2024-04-28 NOTE — Telephone Encounter (Signed)
 Noted pt is requesting to be seen at [facility] in [place] and has appt next week already.

## 2024-05-02 DIAGNOSIS — K429 Umbilical hernia without obstruction or gangrene: Secondary | ICD-10-CM | POA: Diagnosis not present

## 2024-05-03 ENCOUNTER — Telehealth: Payer: Self-pay

## 2024-05-03 NOTE — Telephone Encounter (Signed)
 OV preop clearance appt now scheduled,  med rec and consent done.

## 2024-05-03 NOTE — Telephone Encounter (Signed)
   Pre-operative Risk Assessment    Patient Name: [name]  DOB: 05/18/1966 MRN: [id number]   Date of last office visit: 09/20/2023, Orren Fabry, PA-C Date of next office visit: NONE   Request for Surgical Clearance    Procedure:  Open umbilical hernia repair  Date of Surgery:  Clearance 07/03/24                                Surgeon: Dr. Prentice Pizza, MD Surgeon's Group or Practice Name: [facility] Phone number: [phone] Fax number: [phone]   Type of Clearance Requested:   - Medical  - Pharmacy:  Hold Aspirin      Type of Anesthesia:  Not Indicated   Additional requests/questions:    Bonney Asberry KANDICE Ethelene   05/03/2024, 7:55 AM

## 2024-05-03 NOTE — Telephone Encounter (Signed)
   Name: [name]  DOB: 06/14/1966  MRN: [id number]  Primary Cardiologist: Jerel Balding, MD  Chart reviewed as part of pre-operative protocol coverage. Because of [name]'s past medical history and time since last visit, he will require a follow-up in-office visit in order to better assess preoperative cardiovascular risk.  Pre-op covering staff: - Please schedule appointment and call patient to inform them. If patient already had an upcoming appointment within acceptable timeframe, please add pre-op clearance to the appointment notes so provider is aware. - Please contact requesting surgeon's office via preferred method (i.e, phone, fax) to inform them of need for appointment prior to surgery.  Bilateral LE amputee, can't evaluate through tele visit.   Has followed with Orren Fabry PAC.  Jon Garre Jarian Longoria, PA  05/03/2024, 11:18 AM

## 2024-05-04 ENCOUNTER — Telehealth: Payer: Self-pay

## 2024-05-11 ENCOUNTER — Encounter: Payer: Self-pay | Admitting: Internal Medicine

## 2024-05-11 ENCOUNTER — Encounter: Payer: Self-pay | Admitting: Oncology

## 2024-05-11 DIAGNOSIS — E1142 Type 2 diabetes mellitus with diabetic polyneuropathy: Secondary | ICD-10-CM | POA: Diagnosis not present

## 2024-05-11 DIAGNOSIS — Z008 Encounter for other general examination: Secondary | ICD-10-CM | POA: Diagnosis not present

## 2024-05-11 NOTE — Telephone Encounter (Signed)
 Opened in error

## 2024-05-12 ENCOUNTER — Other Ambulatory Visit: Payer: Self-pay | Admitting: Interventional Radiology

## 2024-05-12 DIAGNOSIS — N2889 Other specified disorders of kidney and ureter: Secondary | ICD-10-CM

## 2024-05-14 NOTE — Telephone Encounter (Signed)
 The main issue is peak/trough variability with IM injections--he gets high levels after the shot (risk for polycythemia, symptoms, adverse events), then drops low before the next dose (symptoms return, labs show trough).  Best Practice for Managing Peaks and Troughs 1. Adjust Injection Frequency and Dose Lower dose, more frequent injections: For example, 100 mg IM every week instead of 200 mg every 2 weeks. This can produce steadier levels and lower peaks, reducing risk of polycythemia and adverse effects. Troughs are less severe, and overall exposure is easier to control. 2. Monitor Labs at Both Peak and Trough If possible, check testosterone , Hgb/Hct at both 3-5 days after injection (peak) and right before next injection (trough). This helps you see the true range and adjust accordingly. 3. Consider Switching to Transdermal (Gel/Patch) Transdermal testosterone  provides daily dosing and more stable serum levels, with lower risk of high peaks. May be better tolerated in patients with significant risk factors, but insurance/cost and skin reactions can be issues. 4. Continue Risk Counseling No method is risk-free if the total testosterone  exposure is too high. Continue to counsel about polycythemia, stroke, heart attack, sleep apnea, and need for ongoing monitoring.   Reviewed risks of peak/trough variability with IM testosterone . Offered option of lower dose, more frequent injections, or switch to transdermal for steadier levels. Reinforced need to keep total exposure within safety limits due to polycythemia and cardiovascular risk

## 2024-05-23 DIAGNOSIS — G546 Phantom limb syndrome with pain: Secondary | ICD-10-CM | POA: Diagnosis not present

## 2024-05-23 DIAGNOSIS — Z5181 Encounter for therapeutic drug level monitoring: Secondary | ICD-10-CM | POA: Diagnosis not present

## 2024-05-23 DIAGNOSIS — M5451 Vertebrogenic low back pain: Secondary | ICD-10-CM | POA: Diagnosis not present

## 2024-05-23 DIAGNOSIS — M51369 Other intervertebral disc degeneration, lumbar region without mention of lumbar back pain or lower extremity pain: Secondary | ICD-10-CM | POA: Diagnosis not present

## 2024-05-26 ENCOUNTER — Inpatient Hospital Stay

## 2024-05-26 ENCOUNTER — Inpatient Hospital Stay: Attending: Oncology

## 2024-05-26 DIAGNOSIS — D751 Secondary polycythemia: Secondary | ICD-10-CM | POA: Insufficient documentation

## 2024-05-26 LAB — CMP (CANCER CENTER ONLY)
ALT: 23 U/L (ref 0–44)
AST: 30 U/L (ref 15–41)
Albumin: 3.9 g/dL (ref 3.5–5.0)
Alkaline Phosphatase: 114 U/L (ref 38–126)
Anion gap: 11 (ref 5–15)
BUN: 14 mg/dL (ref 6–20)
CO2: 24 mmol/L (ref 22–32)
Calcium: 9.3 mg/dL (ref 8.9–10.3)
Chloride: 102 mmol/L (ref 98–111)
Creatinine: 1.34 mg/dL — ABNORMAL HIGH (ref 0.61–1.24)
GFR, Estimated: >60 mL/min (ref >60)
Glucose, Bld: 192 mg/dL — ABNORMAL HIGH (ref 70–99)
Potassium: 4.1 mmol/L (ref 3.5–5.1)
Sodium: 137 mmol/L (ref 135–145)
Total Bilirubin: 0.7 mg/dL (ref 0.0–1.2)
Total Protein: 6.7 g/dL (ref 6.5–8.1)

## 2024-05-26 LAB — HEMOGLOBIN AND HEMATOCRIT (CANCER CENTER ONLY)
HCT: 53.8 % — ABNORMAL HIGH (ref 39.0–52.0)
Hemoglobin: 18.5 g/dL — ABNORMAL HIGH (ref 13.0–17.0)

## 2024-05-26 LAB — LACTATE DEHYDROGENASE: LDH: 201 U/L — ABNORMAL HIGH (ref 98–192)

## 2024-05-26 NOTE — Progress Notes (Signed)
 Hematocrit 53.8 today; per Dr. Autumn ok to defer phlebotomy. Patient agreeable.

## 2024-06-12 ENCOUNTER — Ambulatory Visit: Admitting: Physician Assistant

## 2024-06-16 ENCOUNTER — Telehealth: Payer: Self-pay | Admitting: Cardiovascular Disease

## 2024-06-16 NOTE — Telephone Encounter (Signed)
 Spoke with Con at [employer] regarding a signature for the death certificate. Con was notified that the pt had not seen Dr. Francyne for 3 years and Orren Fabry, PA-C in 6 months. Con stated the [employer], PA-C did not come up in her system. Con asked for her message to be sent to [facility]. Con was notified that I do not know [employer] but could send the message to my supervisor. Con verbalized understanding. All questions if any were answered.

## 2024-06-16 NOTE — Telephone Encounter (Signed)
 Funeral home calling to f/u on signature for Death Certificate. Please advise

## 2024-06-19 ENCOUNTER — Telehealth: Payer: Self-pay

## 2024-06-19 ENCOUNTER — Encounter: Payer: Self-pay | Admitting: Oncology

## 2024-06-19 NOTE — Telephone Encounter (Signed)
 Copied from CRM [id number]. Topic: General - Deceased Patient >> 2024-07-08 10:14 AM Tinnie BROCKS wrote: Name of caller: Damien  Date of death: 27-Jun-2024  Name of funeral home: [facility]  Phone number of funeral home: [phone]  Provider that needs to sign form: Dr. Jesus  Timeline for signing: Case # [id number] can be signed online. She states he died at his home. Needs it as soon as possible, way over 10 days.

## 2024-06-20 NOTE — Telephone Encounter (Signed)
 Called and spoke with leanne stated they received it

## 2024-06-26 ENCOUNTER — Ambulatory Visit: Admitting: Physician Assistant

## 2024-07-08 DEATH — deceased

## 2024-07-10 ENCOUNTER — Encounter: Payer: Self-pay | Admitting: Radiology

## 2024-07-26 ENCOUNTER — Ambulatory Visit: Payer: Self-pay | Admitting: Internal Medicine

## 2024-09-14 ENCOUNTER — Other Ambulatory Visit: Payer: Self-pay | Admitting: Cardiovascular Disease

## 2024-09-14 DIAGNOSIS — T8789 Other complications of amputation stump: Secondary | ICD-10-CM

## 2024-10-24 ENCOUNTER — Other Ambulatory Visit

## 2024-10-24 ENCOUNTER — Ambulatory Visit: Admitting: Oncology

## 2024-10-24 ENCOUNTER — Encounter

## 2025-04-02 ENCOUNTER — Ambulatory Visit
